# Patient Record
Sex: Female | Born: 1940 | Hispanic: Yes | Marital: Married | Smoking: Never smoker
Health system: Southern US, Community
[De-identification: ages and names within clinical notes are randomized; demographics above are authoritative.]

## PROBLEM LIST (undated history)

## (undated) DIAGNOSIS — Z9989 Dependence on other enabling machines and devices: Secondary | ICD-10-CM

## (undated) DIAGNOSIS — Z7901 Long term (current) use of anticoagulants: Secondary | ICD-10-CM

## (undated) DIAGNOSIS — E785 Hyperlipidemia, unspecified: Secondary | ICD-10-CM

## (undated) DIAGNOSIS — M549 Dorsalgia, unspecified: Secondary | ICD-10-CM

## (undated) DIAGNOSIS — I5032 Chronic diastolic (congestive) heart failure: Secondary | ICD-10-CM

## (undated) DIAGNOSIS — G4733 Obstructive sleep apnea (adult) (pediatric): Secondary | ICD-10-CM

## (undated) DIAGNOSIS — I4892 Unspecified atrial flutter: Secondary | ICD-10-CM

## (undated) DIAGNOSIS — N189 Chronic kidney disease, unspecified: Secondary | ICD-10-CM

## (undated) DIAGNOSIS — M1990 Unspecified osteoarthritis, unspecified site: Secondary | ICD-10-CM

## (undated) DIAGNOSIS — I10 Essential (primary) hypertension: Secondary | ICD-10-CM

## (undated) HISTORY — DX: Unspecified osteoarthritis, unspecified site: M19.90

## (undated) HISTORY — PX: TOTAL ABDOMINAL HYSTERECTOMY: SHX209

## (undated) HISTORY — DX: Hyperlipidemia, unspecified: E78.5

## (undated) HISTORY — DX: Essential (primary) hypertension: I10

## (undated) HISTORY — PX: CHOLECYSTECTOMY: SHX55

## (undated) HISTORY — DX: Unspecified atrial flutter: I48.92

## (undated) HISTORY — DX: Chronic diastolic (congestive) heart failure: I50.32

## (undated) HISTORY — PX: APPENDECTOMY: SHX54

---

## 1980-10-20 HISTORY — PX: TONSILLECTOMY: SUR1361

## 1998-05-14 ENCOUNTER — Other Ambulatory Visit: Admission: RE | Admit: 1998-05-14 | Discharge: 1998-05-14 | Payer: Self-pay | Admitting: Dermatology

## 1998-05-30 ENCOUNTER — Ambulatory Visit (HOSPITAL_COMMUNITY): Admission: RE | Admit: 1998-05-30 | Discharge: 1998-05-30 | Payer: Self-pay | Admitting: Family Medicine

## 1998-10-11 ENCOUNTER — Encounter: Payer: Self-pay | Admitting: Family Medicine

## 1998-10-11 ENCOUNTER — Ambulatory Visit (HOSPITAL_COMMUNITY): Admission: RE | Admit: 1998-10-11 | Discharge: 1998-10-11 | Payer: Self-pay | Admitting: Family Medicine

## 1999-07-04 ENCOUNTER — Ambulatory Visit (HOSPITAL_COMMUNITY): Admission: RE | Admit: 1999-07-04 | Discharge: 1999-07-04 | Payer: Self-pay | Admitting: Family Medicine

## 2000-01-13 ENCOUNTER — Emergency Department (HOSPITAL_COMMUNITY): Admission: EM | Admit: 2000-01-13 | Discharge: 2000-01-13 | Payer: Self-pay | Admitting: Emergency Medicine

## 2000-04-29 ENCOUNTER — Ambulatory Visit (HOSPITAL_COMMUNITY): Admission: RE | Admit: 2000-04-29 | Discharge: 2000-04-29 | Payer: Self-pay | Admitting: Gastroenterology

## 2000-10-09 ENCOUNTER — Ambulatory Visit (HOSPITAL_COMMUNITY): Admission: RE | Admit: 2000-10-09 | Discharge: 2000-10-09 | Payer: Self-pay | Admitting: Unknown Physician Specialty

## 2000-10-09 ENCOUNTER — Encounter: Payer: Self-pay | Admitting: Family Medicine

## 2000-11-01 ENCOUNTER — Encounter: Payer: Self-pay | Admitting: Emergency Medicine

## 2000-11-01 ENCOUNTER — Emergency Department (HOSPITAL_COMMUNITY): Admission: EM | Admit: 2000-11-01 | Discharge: 2000-11-01 | Payer: Self-pay | Admitting: Emergency Medicine

## 2001-03-25 ENCOUNTER — Ambulatory Visit (HOSPITAL_BASED_OUTPATIENT_CLINIC_OR_DEPARTMENT_OTHER): Admission: RE | Admit: 2001-03-25 | Discharge: 2001-03-25 | Payer: Self-pay | Admitting: Family Medicine

## 2001-06-25 ENCOUNTER — Encounter: Payer: Self-pay | Admitting: Family Medicine

## 2001-06-25 ENCOUNTER — Encounter: Admission: RE | Admit: 2001-06-25 | Discharge: 2001-06-25 | Payer: Self-pay | Admitting: Family Medicine

## 2001-07-20 ENCOUNTER — Ambulatory Visit (HOSPITAL_COMMUNITY): Admission: RE | Admit: 2001-07-20 | Discharge: 2001-07-20 | Payer: Self-pay | Admitting: Family Medicine

## 2001-07-20 ENCOUNTER — Encounter: Payer: Self-pay | Admitting: Family Medicine

## 2001-09-04 ENCOUNTER — Encounter: Payer: Self-pay | Admitting: Emergency Medicine

## 2001-09-04 ENCOUNTER — Emergency Department (HOSPITAL_COMMUNITY): Admission: EM | Admit: 2001-09-04 | Discharge: 2001-09-04 | Payer: Self-pay | Admitting: Emergency Medicine

## 2002-02-22 ENCOUNTER — Ambulatory Visit (HOSPITAL_COMMUNITY): Admission: RE | Admit: 2002-02-22 | Discharge: 2002-02-22 | Payer: Self-pay | Admitting: Family Medicine

## 2002-06-11 ENCOUNTER — Emergency Department (HOSPITAL_COMMUNITY): Admission: EM | Admit: 2002-06-11 | Discharge: 2002-06-11 | Payer: Self-pay | Admitting: Emergency Medicine

## 2002-06-11 ENCOUNTER — Encounter: Payer: Self-pay | Admitting: Emergency Medicine

## 2002-09-01 ENCOUNTER — Encounter: Payer: Self-pay | Admitting: Family Medicine

## 2002-09-01 ENCOUNTER — Encounter: Admission: RE | Admit: 2002-09-01 | Discharge: 2002-09-01 | Payer: Self-pay | Admitting: Family Medicine

## 2002-10-18 ENCOUNTER — Encounter: Payer: Self-pay | Admitting: Orthopedic Surgery

## 2002-10-18 ENCOUNTER — Encounter: Admission: RE | Admit: 2002-10-18 | Discharge: 2002-10-18 | Payer: Self-pay | Admitting: Orthopedic Surgery

## 2003-01-19 ENCOUNTER — Emergency Department (HOSPITAL_COMMUNITY): Admission: EM | Admit: 2003-01-19 | Discharge: 2003-01-19 | Payer: Self-pay | Admitting: Emergency Medicine

## 2003-01-19 ENCOUNTER — Encounter: Payer: Self-pay | Admitting: Emergency Medicine

## 2004-03-26 ENCOUNTER — Ambulatory Visit (HOSPITAL_COMMUNITY): Admission: RE | Admit: 2004-03-26 | Discharge: 2004-03-26 | Payer: Self-pay | Admitting: Family Medicine

## 2004-04-09 ENCOUNTER — Emergency Department (HOSPITAL_COMMUNITY): Admission: EM | Admit: 2004-04-09 | Discharge: 2004-04-09 | Payer: Self-pay | Admitting: Emergency Medicine

## 2005-01-04 ENCOUNTER — Inpatient Hospital Stay (HOSPITAL_COMMUNITY): Admission: EM | Admit: 2005-01-04 | Discharge: 2005-01-07 | Payer: Self-pay | Admitting: Emergency Medicine

## 2005-01-06 ENCOUNTER — Encounter (INDEPENDENT_AMBULATORY_CARE_PROVIDER_SITE_OTHER): Payer: Self-pay | Admitting: Cardiovascular Disease

## 2005-02-06 ENCOUNTER — Encounter: Admission: RE | Admit: 2005-02-06 | Discharge: 2005-02-06 | Payer: Self-pay | Admitting: Internal Medicine

## 2005-04-20 ENCOUNTER — Emergency Department (HOSPITAL_COMMUNITY): Admission: EM | Admit: 2005-04-20 | Discharge: 2005-04-20 | Payer: Self-pay | Admitting: Emergency Medicine

## 2005-05-22 ENCOUNTER — Ambulatory Visit (HOSPITAL_COMMUNITY): Admission: RE | Admit: 2005-05-22 | Discharge: 2005-05-22 | Payer: Self-pay | Admitting: Internal Medicine

## 2005-07-13 ENCOUNTER — Emergency Department (HOSPITAL_COMMUNITY): Admission: EM | Admit: 2005-07-13 | Discharge: 2005-07-14 | Payer: Self-pay

## 2006-08-13 ENCOUNTER — Inpatient Hospital Stay (HOSPITAL_COMMUNITY): Admission: EM | Admit: 2006-08-13 | Discharge: 2006-08-16 | Payer: Self-pay | Admitting: Emergency Medicine

## 2006-09-28 ENCOUNTER — Ambulatory Visit (HOSPITAL_COMMUNITY): Admission: RE | Admit: 2006-09-28 | Discharge: 2006-09-28 | Payer: Self-pay | Admitting: Internal Medicine

## 2006-12-11 ENCOUNTER — Ambulatory Visit: Payer: Self-pay | Admitting: Internal Medicine

## 2006-12-15 ENCOUNTER — Ambulatory Visit: Payer: Self-pay | Admitting: *Deleted

## 2007-01-03 ENCOUNTER — Emergency Department (HOSPITAL_COMMUNITY): Admission: EM | Admit: 2007-01-03 | Discharge: 2007-01-03 | Payer: Self-pay | Admitting: Emergency Medicine

## 2007-01-07 ENCOUNTER — Ambulatory Visit: Payer: Self-pay | Admitting: Internal Medicine

## 2007-01-21 ENCOUNTER — Ambulatory Visit: Payer: Self-pay | Admitting: Internal Medicine

## 2007-02-18 ENCOUNTER — Ambulatory Visit: Payer: Self-pay | Admitting: Internal Medicine

## 2007-03-22 ENCOUNTER — Ambulatory Visit: Payer: Self-pay | Admitting: Internal Medicine

## 2007-04-12 ENCOUNTER — Ambulatory Visit: Payer: Self-pay

## 2007-05-04 ENCOUNTER — Ambulatory Visit: Payer: Self-pay | Admitting: Internal Medicine

## 2007-05-26 ENCOUNTER — Encounter (INDEPENDENT_AMBULATORY_CARE_PROVIDER_SITE_OTHER): Payer: Self-pay | Admitting: Nurse Practitioner

## 2007-05-26 ENCOUNTER — Ambulatory Visit: Payer: Self-pay | Admitting: Internal Medicine

## 2007-05-26 LAB — CONVERTED CEMR LAB
BUN: 18 mg/dL (ref 6–23)
Basophils Absolute: 0 10*3/uL (ref 0.0–0.1)
Basophils Relative: 0 % (ref 0–1)
CO2: 24 meq/L (ref 19–32)
Chloride: 105 meq/L (ref 96–112)
Lymphs Abs: 4.3 10*3/uL — ABNORMAL HIGH (ref 0.7–3.3)
MCHC: 32 g/dL (ref 30.0–36.0)
MCV: 83.1 fL (ref 78.0–100.0)
Monocytes Relative: 11 % (ref 3–11)
Neutrophils Relative %: 53 % (ref 43–77)
Platelets: 350 10*3/uL (ref 150–400)
Potassium: 3.8 meq/L (ref 3.5–5.3)
RDW: 15.4 % — ABNORMAL HIGH (ref 11.5–14.0)
Sodium: 143 meq/L (ref 135–145)

## 2007-05-27 ENCOUNTER — Ambulatory Visit: Payer: Self-pay | Admitting: Internal Medicine

## 2007-05-27 ENCOUNTER — Encounter (INDEPENDENT_AMBULATORY_CARE_PROVIDER_SITE_OTHER): Payer: Self-pay | Admitting: Nurse Practitioner

## 2007-05-27 LAB — CONVERTED CEMR LAB
AST: 13 units/L (ref 0–37)
Alkaline Phosphatase: 90 units/L (ref 39–117)
Bilirubin, Direct: 0.1 mg/dL (ref 0.0–0.3)
Indirect Bilirubin: 0.3 mg/dL (ref 0.0–0.9)
LDL Cholesterol: 60 mg/dL (ref 0–99)

## 2007-06-07 ENCOUNTER — Ambulatory Visit: Payer: Self-pay | Admitting: Internal Medicine

## 2007-06-23 ENCOUNTER — Ambulatory Visit: Payer: Self-pay | Admitting: Internal Medicine

## 2007-07-07 ENCOUNTER — Encounter (INDEPENDENT_AMBULATORY_CARE_PROVIDER_SITE_OTHER): Payer: Self-pay | Admitting: *Deleted

## 2007-07-16 ENCOUNTER — Ambulatory Visit: Payer: Self-pay | Admitting: Internal Medicine

## 2007-07-21 ENCOUNTER — Ambulatory Visit: Payer: Self-pay | Admitting: Internal Medicine

## 2007-07-27 ENCOUNTER — Ambulatory Visit: Payer: Self-pay | Admitting: Family Medicine

## 2007-07-30 ENCOUNTER — Ambulatory Visit: Payer: Self-pay | Admitting: Family Medicine

## 2007-08-17 ENCOUNTER — Ambulatory Visit: Payer: Self-pay | Admitting: Internal Medicine

## 2007-09-14 ENCOUNTER — Ambulatory Visit: Payer: Self-pay | Admitting: Internal Medicine

## 2007-09-28 ENCOUNTER — Ambulatory Visit: Payer: Self-pay | Admitting: Internal Medicine

## 2007-10-06 ENCOUNTER — Ambulatory Visit: Payer: Self-pay | Admitting: Internal Medicine

## 2007-10-07 ENCOUNTER — Ambulatory Visit: Payer: Self-pay | Admitting: Internal Medicine

## 2007-10-07 LAB — CONVERTED CEMR LAB
ALT: 17 units/L (ref 0–35)
BUN: 19 mg/dL (ref 6–23)
Chloride: 101 meq/L (ref 96–112)
Creatinine, Ser: 0.59 mg/dL (ref 0.40–1.20)
Eosinophils Relative: 3 % (ref 0–5)
Glucose, Bld: 137 mg/dL — ABNORMAL HIGH (ref 70–99)
LDL Cholesterol: 69 mg/dL (ref 0–99)
Lymphocytes Relative: 31 % (ref 12–46)
Monocytes Absolute: 1.3 10*3/uL — ABNORMAL HIGH (ref 0.1–1.0)
Monocytes Relative: 12 % (ref 3–12)
Neutrophils Relative %: 54 % (ref 43–77)
Platelets: 352 10*3/uL (ref 150–400)
RBC: 5.46 M/uL — ABNORMAL HIGH (ref 3.87–5.11)
RDW: 15.7 % — ABNORMAL HIGH (ref 11.5–15.5)
Sodium: 142 meq/L (ref 135–145)
Total CHOL/HDL Ratio: 2.7
Triglycerides: 189 mg/dL — ABNORMAL HIGH (ref <150)
VLDL: 38 mg/dL (ref 0–40)
WBC: 11.1 10*3/uL — ABNORMAL HIGH (ref 4.0–10.5)

## 2007-10-28 ENCOUNTER — Ambulatory Visit: Payer: Self-pay | Admitting: Internal Medicine

## 2007-11-22 ENCOUNTER — Emergency Department (HOSPITAL_COMMUNITY): Admission: EM | Admit: 2007-11-22 | Discharge: 2007-11-22 | Payer: Self-pay | Admitting: Emergency Medicine

## 2007-11-25 ENCOUNTER — Ambulatory Visit: Payer: Self-pay | Admitting: Nurse Practitioner

## 2007-11-25 ENCOUNTER — Ambulatory Visit: Payer: Self-pay | Admitting: Internal Medicine

## 2007-12-02 ENCOUNTER — Ambulatory Visit (HOSPITAL_COMMUNITY): Admission: RE | Admit: 2007-12-02 | Discharge: 2007-12-02 | Payer: Self-pay | Admitting: Family Medicine

## 2007-12-23 ENCOUNTER — Ambulatory Visit: Payer: Self-pay | Admitting: Internal Medicine

## 2008-01-13 ENCOUNTER — Ambulatory Visit: Payer: Self-pay | Admitting: Nurse Practitioner

## 2008-02-01 ENCOUNTER — Ambulatory Visit: Payer: Self-pay | Admitting: Nurse Practitioner

## 2008-02-22 ENCOUNTER — Ambulatory Visit: Payer: Self-pay | Admitting: Internal Medicine

## 2008-03-10 ENCOUNTER — Ambulatory Visit: Payer: Self-pay | Admitting: Internal Medicine

## 2008-03-14 ENCOUNTER — Emergency Department (HOSPITAL_COMMUNITY): Admission: EM | Admit: 2008-03-14 | Discharge: 2008-03-14 | Payer: Self-pay | Admitting: Emergency Medicine

## 2008-03-21 ENCOUNTER — Ambulatory Visit: Payer: Self-pay | Admitting: Internal Medicine

## 2008-03-21 LAB — CONVERTED CEMR LAB
Albumin: 4.1 g/dL (ref 3.5–5.2)
Basophils Absolute: 0 10*3/uL (ref 0.0–0.1)
Basophils Relative: 0 % (ref 0–1)
Bilirubin, Direct: 0.1 mg/dL (ref 0.0–0.3)
CO2: 26 meq/L (ref 19–32)
Calcium: 8.8 mg/dL (ref 8.4–10.5)
Cholesterol: 144 mg/dL (ref 0–200)
Creatinine, Ser: 0.64 mg/dL (ref 0.40–1.20)
Eosinophils Relative: 2 % (ref 0–5)
Glucose, Bld: 150 mg/dL — ABNORMAL HIGH (ref 70–99)
HCT: 44.9 % (ref 36.0–46.0)
Hemoglobin: 13.9 g/dL (ref 12.0–15.0)
LDL Cholesterol: 61 mg/dL (ref 0–99)
Lymphs Abs: 3.4 10*3/uL (ref 0.7–4.0)
Monocytes Absolute: 1.4 10*3/uL — ABNORMAL HIGH (ref 0.1–1.0)
Monocytes Relative: 11 % (ref 3–12)
Potassium: 3.9 meq/L (ref 3.5–5.3)
RBC: 5.32 M/uL — ABNORMAL HIGH (ref 3.87–5.11)
Sodium: 142 meq/L (ref 135–145)

## 2008-03-30 ENCOUNTER — Ambulatory Visit: Payer: Self-pay | Admitting: Internal Medicine

## 2008-04-16 ENCOUNTER — Emergency Department (HOSPITAL_COMMUNITY): Admission: EM | Admit: 2008-04-16 | Discharge: 2008-04-17 | Payer: Self-pay | Admitting: Emergency Medicine

## 2008-05-01 ENCOUNTER — Ambulatory Visit: Payer: Self-pay | Admitting: Internal Medicine

## 2008-05-01 LAB — CONVERTED CEMR LAB
CO2: 25 meq/L (ref 19–32)
Chloride: 102 meq/L (ref 96–112)
Creatinine, Ser: 0.64 mg/dL (ref 0.40–1.20)
INR: 1.6 — ABNORMAL HIGH (ref 0.0–1.5)
Sodium: 140 meq/L (ref 135–145)

## 2008-05-10 ENCOUNTER — Ambulatory Visit: Payer: Self-pay | Admitting: Internal Medicine

## 2008-05-11 ENCOUNTER — Encounter (INDEPENDENT_AMBULATORY_CARE_PROVIDER_SITE_OTHER): Payer: Self-pay | Admitting: Internal Medicine

## 2008-05-15 ENCOUNTER — Ambulatory Visit: Payer: Self-pay | Admitting: Internal Medicine

## 2008-05-15 LAB — CONVERTED CEMR LAB: INR: 1.8 — ABNORMAL HIGH (ref 0.0–1.5)

## 2008-09-25 ENCOUNTER — Emergency Department (HOSPITAL_COMMUNITY): Admission: EM | Admit: 2008-09-25 | Discharge: 2008-09-26 | Payer: Self-pay | Admitting: Emergency Medicine

## 2008-09-27 ENCOUNTER — Ambulatory Visit: Payer: Self-pay | Admitting: Internal Medicine

## 2008-09-27 LAB — CONVERTED CEMR LAB
INR: 1.7 — ABNORMAL HIGH (ref 0.0–1.5)
Prothrombin Time: 21.2 s — ABNORMAL HIGH (ref 11.6–15.2)

## 2008-09-28 ENCOUNTER — Ambulatory Visit: Payer: Self-pay | Admitting: Internal Medicine

## 2008-10-05 ENCOUNTER — Ambulatory Visit: Payer: Self-pay | Admitting: Internal Medicine

## 2008-10-19 ENCOUNTER — Ambulatory Visit: Payer: Self-pay | Admitting: Internal Medicine

## 2008-10-19 LAB — CONVERTED CEMR LAB
ALT: 12 units/L (ref 0–35)
AST: 13 units/L (ref 0–37)
Albumin: 4.1 g/dL (ref 3.5–5.2)
Alkaline Phosphatase: 91 units/L (ref 39–117)
BUN: 20 mg/dL (ref 6–23)
CO2: 28 meq/L (ref 19–32)
Calcium: 9.8 mg/dL (ref 8.4–10.5)
Creatinine, Ser: 0.61 mg/dL (ref 0.40–1.20)
Potassium: 4 meq/L (ref 3.5–5.3)
Sodium: 144 meq/L (ref 135–145)

## 2008-10-29 ENCOUNTER — Encounter: Payer: Self-pay | Admitting: Internal Medicine

## 2008-10-29 ENCOUNTER — Ambulatory Visit (HOSPITAL_BASED_OUTPATIENT_CLINIC_OR_DEPARTMENT_OTHER): Admission: RE | Admit: 2008-10-29 | Discharge: 2008-10-29 | Payer: Self-pay | Admitting: Internal Medicine

## 2008-11-07 ENCOUNTER — Ambulatory Visit: Payer: Self-pay | Admitting: Pulmonary Disease

## 2008-11-16 ENCOUNTER — Ambulatory Visit: Payer: Self-pay | Admitting: Internal Medicine

## 2008-11-22 ENCOUNTER — Ambulatory Visit (HOSPITAL_COMMUNITY): Admission: RE | Admit: 2008-11-22 | Discharge: 2008-11-22 | Payer: Self-pay | Admitting: Gastroenterology

## 2008-11-30 ENCOUNTER — Ambulatory Visit: Payer: Self-pay | Admitting: Internal Medicine

## 2008-11-30 ENCOUNTER — Ambulatory Visit: Payer: Self-pay | Admitting: Pulmonary Disease

## 2008-11-30 DIAGNOSIS — E785 Hyperlipidemia, unspecified: Secondary | ICD-10-CM | POA: Insufficient documentation

## 2008-11-30 DIAGNOSIS — G4733 Obstructive sleep apnea (adult) (pediatric): Secondary | ICD-10-CM | POA: Insufficient documentation

## 2008-12-04 ENCOUNTER — Ambulatory Visit (HOSPITAL_COMMUNITY): Admission: RE | Admit: 2008-12-04 | Discharge: 2008-12-04 | Payer: Self-pay | Admitting: Internal Medicine

## 2008-12-21 ENCOUNTER — Ambulatory Visit: Payer: Self-pay | Admitting: Internal Medicine

## 2008-12-28 ENCOUNTER — Ambulatory Visit: Payer: Self-pay | Admitting: Internal Medicine

## 2009-01-10 ENCOUNTER — Encounter: Admission: RE | Admit: 2009-01-10 | Discharge: 2009-04-10 | Payer: Self-pay | Admitting: Internal Medicine

## 2009-01-13 ENCOUNTER — Encounter: Payer: Self-pay | Admitting: Pulmonary Disease

## 2009-01-22 ENCOUNTER — Encounter: Payer: Self-pay | Admitting: Internal Medicine

## 2009-01-22 ENCOUNTER — Ambulatory Visit: Payer: Self-pay | Admitting: Internal Medicine

## 2009-01-22 DIAGNOSIS — M7660 Achilles tendinitis, unspecified leg: Secondary | ICD-10-CM | POA: Insufficient documentation

## 2009-01-23 ENCOUNTER — Ambulatory Visit: Payer: Self-pay | Admitting: Internal Medicine

## 2009-01-24 ENCOUNTER — Ambulatory Visit: Payer: Self-pay | Admitting: Pulmonary Disease

## 2009-01-25 ENCOUNTER — Ambulatory Visit: Payer: Self-pay | Admitting: Internal Medicine

## 2009-02-22 ENCOUNTER — Ambulatory Visit: Payer: Self-pay | Admitting: Internal Medicine

## 2009-03-05 ENCOUNTER — Encounter (INDEPENDENT_AMBULATORY_CARE_PROVIDER_SITE_OTHER): Payer: Self-pay | Admitting: *Deleted

## 2009-03-06 ENCOUNTER — Ambulatory Visit (HOSPITAL_COMMUNITY): Admission: RE | Admit: 2009-03-06 | Discharge: 2009-03-06 | Payer: Self-pay | Admitting: Internal Medicine

## 2009-03-07 ENCOUNTER — Ambulatory Visit: Payer: Self-pay | Admitting: Internal Medicine

## 2009-03-12 ENCOUNTER — Ambulatory Visit: Payer: Self-pay | Admitting: Pulmonary Disease

## 2009-03-28 ENCOUNTER — Ambulatory Visit: Payer: Self-pay | Admitting: Internal Medicine

## 2009-05-09 ENCOUNTER — Ambulatory Visit: Payer: Self-pay | Admitting: Internal Medicine

## 2009-05-09 ENCOUNTER — Ambulatory Visit: Payer: Self-pay | Admitting: Family Medicine

## 2009-05-16 DIAGNOSIS — E119 Type 2 diabetes mellitus without complications: Secondary | ICD-10-CM | POA: Insufficient documentation

## 2009-05-16 DIAGNOSIS — I10 Essential (primary) hypertension: Secondary | ICD-10-CM

## 2009-05-16 DIAGNOSIS — I4891 Unspecified atrial fibrillation: Secondary | ICD-10-CM

## 2009-05-16 DIAGNOSIS — Z8669 Personal history of other diseases of the nervous system and sense organs: Secondary | ICD-10-CM | POA: Insufficient documentation

## 2009-05-16 DIAGNOSIS — I480 Paroxysmal atrial fibrillation: Secondary | ICD-10-CM | POA: Insufficient documentation

## 2009-05-16 DIAGNOSIS — M109 Gout, unspecified: Secondary | ICD-10-CM | POA: Insufficient documentation

## 2009-05-16 DIAGNOSIS — E1159 Type 2 diabetes mellitus with other circulatory complications: Secondary | ICD-10-CM | POA: Insufficient documentation

## 2009-05-16 DIAGNOSIS — I152 Hypertension secondary to endocrine disorders: Secondary | ICD-10-CM | POA: Insufficient documentation

## 2009-05-24 ENCOUNTER — Emergency Department (HOSPITAL_COMMUNITY): Admission: EM | Admit: 2009-05-24 | Discharge: 2009-05-25 | Payer: Self-pay | Admitting: Emergency Medicine

## 2009-06-04 ENCOUNTER — Ambulatory Visit: Payer: Self-pay | Admitting: Internal Medicine

## 2009-06-04 LAB — CONVERTED CEMR LAB
ALT: 12 units/L (ref 0–35)
AST: 14 units/L (ref 0–37)
Alkaline Phosphatase: 84 units/L (ref 39–117)
BUN: 15 mg/dL (ref 6–23)
CO2: 24 meq/L (ref 19–32)
Calcium: 8.9 mg/dL (ref 8.4–10.5)
Cholesterol: 154 mg/dL (ref 0–200)
Creatinine, Ser: 0.56 mg/dL (ref 0.40–1.20)
Glucose, Bld: 128 mg/dL — ABNORMAL HIGH (ref 70–99)
HDL: 58 mg/dL (ref >39)
Sodium: 144 meq/L (ref 135–145)
Total Protein: 7 g/dL (ref 6.0–8.3)
Triglycerides: 176 mg/dL — ABNORMAL HIGH (ref <150)

## 2009-06-13 ENCOUNTER — Encounter: Payer: Self-pay | Admitting: Physician Assistant

## 2009-06-13 ENCOUNTER — Ambulatory Visit: Payer: Self-pay | Admitting: Internal Medicine

## 2009-06-21 ENCOUNTER — Ambulatory Visit: Payer: Self-pay | Admitting: Internal Medicine

## 2009-06-22 ENCOUNTER — Encounter (INDEPENDENT_AMBULATORY_CARE_PROVIDER_SITE_OTHER): Payer: Self-pay | Admitting: Internal Medicine

## 2009-07-05 ENCOUNTER — Ambulatory Visit: Payer: Self-pay | Admitting: Internal Medicine

## 2009-08-03 ENCOUNTER — Emergency Department (HOSPITAL_COMMUNITY): Admission: EM | Admit: 2009-08-03 | Discharge: 2009-08-03 | Payer: Self-pay | Admitting: Emergency Medicine

## 2009-08-21 ENCOUNTER — Ambulatory Visit: Payer: Self-pay | Admitting: Internal Medicine

## 2009-08-21 LAB — CONVERTED CEMR LAB
Calcium: 8.9 mg/dL (ref 8.4–10.5)
Chloride: 101 meq/L (ref 96–112)
Glucose, Bld: 155 mg/dL — ABNORMAL HIGH (ref 70–99)

## 2009-08-22 ENCOUNTER — Encounter (INDEPENDENT_AMBULATORY_CARE_PROVIDER_SITE_OTHER): Payer: Self-pay | Admitting: Internal Medicine

## 2009-09-03 ENCOUNTER — Ambulatory Visit: Payer: Self-pay | Admitting: Internal Medicine

## 2009-09-12 ENCOUNTER — Ambulatory Visit: Payer: Self-pay | Admitting: Internal Medicine

## 2009-09-18 ENCOUNTER — Ambulatory Visit: Payer: Self-pay | Admitting: Internal Medicine

## 2009-09-18 LAB — CONVERTED CEMR LAB
BUN: 16 mg/dL (ref 6–23)
Calcium: 9.1 mg/dL (ref 8.4–10.5)
Chloride: 101 meq/L (ref 96–112)
Creatinine, Ser: 0.61 mg/dL (ref 0.40–1.20)
Potassium: 3.7 meq/L (ref 3.5–5.3)

## 2009-09-27 ENCOUNTER — Ambulatory Visit: Payer: Self-pay | Admitting: Internal Medicine

## 2009-10-01 ENCOUNTER — Ambulatory Visit: Payer: Self-pay | Admitting: Internal Medicine

## 2009-10-11 ENCOUNTER — Ambulatory Visit: Payer: Self-pay | Admitting: Internal Medicine

## 2009-10-11 ENCOUNTER — Inpatient Hospital Stay (HOSPITAL_COMMUNITY): Admission: EM | Admit: 2009-10-11 | Discharge: 2009-10-12 | Payer: Self-pay | Admitting: Emergency Medicine

## 2009-10-22 ENCOUNTER — Ambulatory Visit: Payer: Self-pay | Admitting: Internal Medicine

## 2009-10-29 ENCOUNTER — Ambulatory Visit: Payer: Self-pay | Admitting: Internal Medicine

## 2009-11-15 ENCOUNTER — Ambulatory Visit: Payer: Self-pay | Admitting: Internal Medicine

## 2009-11-15 LAB — CONVERTED CEMR LAB: Hgb A1c MFr Bld: 7.3 % — ABNORMAL HIGH (ref 4.6–6.1)

## 2009-11-20 ENCOUNTER — Ambulatory Visit: Payer: Self-pay | Admitting: Internal Medicine

## 2009-11-20 LAB — CONVERTED CEMR LAB
CO2: 30 meq/L (ref 19–32)
Calcium: 8.9 mg/dL (ref 8.4–10.5)
Chloride: 98 meq/L (ref 96–112)
Glucose, Bld: 96 mg/dL (ref 70–99)
Potassium: 3.5 meq/L (ref 3.5–5.3)
Sodium: 142 meq/L (ref 135–145)

## 2009-11-30 ENCOUNTER — Ambulatory Visit: Payer: Self-pay | Admitting: Adult Health

## 2009-11-30 ENCOUNTER — Telehealth (INDEPENDENT_AMBULATORY_CARE_PROVIDER_SITE_OTHER): Payer: Self-pay | Admitting: *Deleted

## 2009-12-13 ENCOUNTER — Ambulatory Visit: Payer: Self-pay | Admitting: Internal Medicine

## 2010-01-02 ENCOUNTER — Ambulatory Visit: Payer: Self-pay | Admitting: Internal Medicine

## 2010-01-02 ENCOUNTER — Ambulatory Visit (HOSPITAL_COMMUNITY): Admission: RE | Admit: 2010-01-02 | Discharge: 2010-01-02 | Payer: Self-pay | Admitting: Internal Medicine

## 2010-01-04 ENCOUNTER — Ambulatory Visit: Payer: Self-pay | Admitting: Internal Medicine

## 2010-01-04 ENCOUNTER — Ambulatory Visit: Payer: Self-pay | Admitting: Family Medicine

## 2010-01-08 ENCOUNTER — Ambulatory Visit (HOSPITAL_COMMUNITY): Admission: RE | Admit: 2010-01-08 | Discharge: 2010-01-08 | Payer: Self-pay | Admitting: Family Medicine

## 2010-01-14 ENCOUNTER — Ambulatory Visit: Payer: Self-pay | Admitting: Internal Medicine

## 2010-01-31 ENCOUNTER — Ambulatory Visit: Payer: Self-pay | Admitting: Internal Medicine

## 2010-02-18 ENCOUNTER — Telehealth (INDEPENDENT_AMBULATORY_CARE_PROVIDER_SITE_OTHER): Payer: Self-pay | Admitting: *Deleted

## 2010-02-18 ENCOUNTER — Ambulatory Visit: Payer: Self-pay | Admitting: Family Medicine

## 2010-02-20 ENCOUNTER — Ambulatory Visit: Payer: Self-pay | Admitting: Internal Medicine

## 2010-02-20 DIAGNOSIS — I5032 Chronic diastolic (congestive) heart failure: Secondary | ICD-10-CM | POA: Insufficient documentation

## 2010-02-25 ENCOUNTER — Ambulatory Visit: Payer: Self-pay | Admitting: Pulmonary Disease

## 2010-02-25 ENCOUNTER — Ambulatory Visit: Payer: Self-pay | Admitting: Internal Medicine

## 2010-02-25 LAB — CONVERTED CEMR LAB
Anti Nuclear Antibody(ANA): NEGATIVE
BUN: 14 mg/dL (ref 6–23)
CO2: 30 meq/L (ref 19–32)
Calcium: 8.8 mg/dL (ref 8.4–10.5)
Chloride: 102 meq/L (ref 96–112)
Creatinine, Ser: 0.55 mg/dL (ref 0.40–1.20)
Glucose, Bld: 118 mg/dL — ABNORMAL HIGH (ref 70–99)
Hgb A1c MFr Bld: 7.3 % — ABNORMAL HIGH (ref <5.7)
TSH: 2.852 microintl units/mL (ref 0.350–4.500)
Total Bilirubin: 0.4 mg/dL (ref 0.3–1.2)

## 2010-02-28 ENCOUNTER — Ambulatory Visit: Payer: Self-pay | Admitting: Internal Medicine

## 2010-03-05 ENCOUNTER — Telehealth (INDEPENDENT_AMBULATORY_CARE_PROVIDER_SITE_OTHER): Payer: Self-pay | Admitting: *Deleted

## 2010-03-06 ENCOUNTER — Ambulatory Visit: Payer: Self-pay | Admitting: Internal Medicine

## 2010-03-19 ENCOUNTER — Ambulatory Visit: Payer: Self-pay | Admitting: Internal Medicine

## 2010-04-05 ENCOUNTER — Ambulatory Visit: Payer: Self-pay | Admitting: Internal Medicine

## 2010-04-08 ENCOUNTER — Ambulatory Visit: Payer: Self-pay | Admitting: Internal Medicine

## 2010-05-08 ENCOUNTER — Ambulatory Visit: Payer: Self-pay | Admitting: Internal Medicine

## 2010-05-09 ENCOUNTER — Ambulatory Visit: Payer: Self-pay | Admitting: Family Medicine

## 2010-05-16 ENCOUNTER — Ambulatory Visit: Payer: Self-pay | Admitting: Family Medicine

## 2010-05-27 ENCOUNTER — Ambulatory Visit: Payer: Self-pay | Admitting: Internal Medicine

## 2010-06-01 ENCOUNTER — Emergency Department (HOSPITAL_COMMUNITY): Admission: EM | Admit: 2010-06-01 | Discharge: 2010-06-01 | Payer: Self-pay | Admitting: Emergency Medicine

## 2010-06-06 ENCOUNTER — Ambulatory Visit: Payer: Self-pay | Admitting: Internal Medicine

## 2010-06-17 ENCOUNTER — Inpatient Hospital Stay (HOSPITAL_COMMUNITY): Admission: EM | Admit: 2010-06-17 | Discharge: 2010-06-18 | Payer: Self-pay | Admitting: Emergency Medicine

## 2010-06-17 ENCOUNTER — Ambulatory Visit: Payer: Self-pay | Admitting: Cardiology

## 2010-06-20 ENCOUNTER — Ambulatory Visit: Payer: Self-pay | Admitting: Internal Medicine

## 2010-07-04 ENCOUNTER — Encounter (INDEPENDENT_AMBULATORY_CARE_PROVIDER_SITE_OTHER): Payer: Self-pay | Admitting: Internal Medicine

## 2010-07-04 LAB — CONVERTED CEMR LAB
ALT: 11 units/L (ref 0–35)
AST: 13 units/L (ref 0–37)
BUN: 18 mg/dL (ref 6–23)
Cholesterol: 157 mg/dL (ref 0–200)
Creatinine, Ser: 0.67 mg/dL (ref 0.40–1.20)
HDL: 61 mg/dL (ref >39)
Hgb A1c MFr Bld: 7.8 % — ABNORMAL HIGH (ref <5.7)
INR: 1.78 — ABNORMAL HIGH (ref <1.50)
Prothrombin Time: 20.9 s — ABNORMAL HIGH (ref 11.6–15.2)
Total Bilirubin: 0.7 mg/dL (ref 0.3–1.2)
Total CHOL/HDL Ratio: 2.6
VLDL: 35 mg/dL (ref 0–40)

## 2010-07-09 ENCOUNTER — Telehealth: Payer: Self-pay | Admitting: Internal Medicine

## 2010-08-01 ENCOUNTER — Encounter (INDEPENDENT_AMBULATORY_CARE_PROVIDER_SITE_OTHER): Payer: Self-pay | Admitting: Internal Medicine

## 2010-08-01 LAB — CONVERTED CEMR LAB: Prothrombin Time: 28.9 s — ABNORMAL HIGH (ref 11.6–15.2)

## 2010-10-02 ENCOUNTER — Emergency Department (HOSPITAL_COMMUNITY)
Admission: EM | Admit: 2010-10-02 | Discharge: 2010-10-02 | Payer: Self-pay | Source: Home / Self Care | Admitting: Family Medicine

## 2010-10-03 ENCOUNTER — Telehealth: Payer: Self-pay | Admitting: Internal Medicine

## 2010-10-11 ENCOUNTER — Ambulatory Visit (HOSPITAL_COMMUNITY)
Admission: RE | Admit: 2010-10-11 | Discharge: 2010-10-11 | Payer: Self-pay | Source: Home / Self Care | Attending: Family Medicine | Admitting: Family Medicine

## 2010-10-14 ENCOUNTER — Emergency Department (HOSPITAL_COMMUNITY)
Admission: EM | Admit: 2010-10-14 | Discharge: 2010-10-14 | Payer: Self-pay | Source: Home / Self Care | Admitting: Emergency Medicine

## 2010-10-14 ENCOUNTER — Emergency Department (HOSPITAL_COMMUNITY)
Admission: EM | Admit: 2010-10-14 | Discharge: 2010-10-14 | Disposition: A | Discharge status: Other Acute Inpatient Hospital | Payer: Self-pay | Source: Home / Self Care | Admitting: Emergency Medicine

## 2010-10-15 ENCOUNTER — Encounter: Payer: Self-pay | Admitting: Internal Medicine

## 2010-10-15 ENCOUNTER — Telehealth: Payer: Self-pay | Admitting: Internal Medicine

## 2010-10-15 ENCOUNTER — Ambulatory Visit
Admission: RE | Admit: 2010-10-15 | Discharge: 2010-10-15 | Payer: Self-pay | Source: Home / Self Care | Attending: Internal Medicine | Admitting: Internal Medicine

## 2010-10-15 ENCOUNTER — Encounter (INDEPENDENT_AMBULATORY_CARE_PROVIDER_SITE_OTHER): Payer: Self-pay | Admitting: *Deleted

## 2010-10-16 ENCOUNTER — Encounter: Payer: Self-pay | Admitting: Internal Medicine

## 2010-10-16 ENCOUNTER — Ambulatory Visit (HOSPITAL_COMMUNITY)
Admission: RE | Admit: 2010-10-16 | Discharge: 2010-10-16 | Payer: Self-pay | Source: Home / Self Care | Attending: Family Medicine | Admitting: Family Medicine

## 2010-10-16 LAB — CONVERTED CEMR LAB
Basophils Absolute: 0.1 10*3/uL (ref 0.0–0.1)
Basophils Relative: 0.9 % (ref 0.0–3.0)
CO2: 30 meq/L (ref 19–32)
Calcium: 9.4 mg/dL (ref 8.4–10.5)
Creatinine, Ser: 0.8 mg/dL (ref 0.4–1.2)
Eosinophils Absolute: 0.2 10*3/uL (ref 0.0–0.7)
GFR calc non Af Amer: 76.04 mL/min (ref >60.00)
Hemoglobin: 14.6 g/dL (ref 12.0–15.0)
INR: 1.9 — ABNORMAL HIGH (ref 0.8–1.0)
Lymphocytes Relative: 24 % (ref 12.0–46.0)
MCHC: 33.1 g/dL (ref 30.0–36.0)
Monocytes Relative: 11 % (ref 3.0–12.0)
Neutro Abs: 10.5 10*3/uL — ABNORMAL HIGH (ref 1.4–7.7)
Neutrophils Relative %: 62.8 % (ref 43.0–77.0)
RBC: 5.42 M/uL — ABNORMAL HIGH (ref 3.87–5.11)
Sodium: 141 meq/L (ref 135–145)
aPTT: 35.6 s — ABNORMAL HIGH (ref 21.7–28.8)

## 2010-10-17 ENCOUNTER — Ambulatory Visit (HOSPITAL_COMMUNITY)
Admission: RE | Admit: 2010-10-17 | Discharge: 2010-10-17 | Payer: Self-pay | Source: Home / Self Care | Attending: Internal Medicine | Admitting: Internal Medicine

## 2010-10-18 ENCOUNTER — Encounter (INDEPENDENT_AMBULATORY_CARE_PROVIDER_SITE_OTHER): Payer: Self-pay | Admitting: Family Medicine

## 2010-10-18 ENCOUNTER — Encounter: Payer: Self-pay | Admitting: Endocrinology

## 2010-10-18 LAB — CONVERTED CEMR LAB: Cortisol - AM: 4.8 ug/dL (ref 4.3–22.4)

## 2010-10-22 ENCOUNTER — Telehealth (INDEPENDENT_AMBULATORY_CARE_PROVIDER_SITE_OTHER): Payer: Self-pay | Admitting: *Deleted

## 2010-10-22 ENCOUNTER — Encounter: Payer: Self-pay | Admitting: Endocrinology

## 2010-10-23 ENCOUNTER — Encounter (INDEPENDENT_AMBULATORY_CARE_PROVIDER_SITE_OTHER): Payer: Self-pay | Admitting: Family Medicine

## 2010-10-25 ENCOUNTER — Ambulatory Visit (HOSPITAL_COMMUNITY)
Admission: RE | Admit: 2010-10-25 | Discharge: 2010-10-25 | Payer: Self-pay | Source: Home / Self Care | Attending: Family Medicine | Admitting: Family Medicine

## 2010-10-25 LAB — BUN: BUN: 13 mg/dL (ref 6–23)

## 2010-10-25 LAB — CREATININE, SERUM
Creatinine, Ser: 0.76 mg/dL (ref 0.4–1.2)
GFR calc Af Amer: >60 mL/min (ref >60)
GFR calc non Af Amer: >60 mL/min (ref >60)

## 2010-10-29 ENCOUNTER — Encounter: Payer: Self-pay | Admitting: Endocrinology

## 2010-10-29 ENCOUNTER — Encounter (INDEPENDENT_AMBULATORY_CARE_PROVIDER_SITE_OTHER): Payer: Self-pay | Admitting: *Deleted

## 2010-10-29 LAB — CONVERTED CEMR LAB
ALT: 16 units/L (ref 0–35)
AST: <8 units/L (ref 0–37)
Alkaline Phosphatase: 92 units/L (ref 39–117)
BUN: 20 mg/dL (ref 6–23)
Creatinine, Ser: 0.58 mg/dL (ref 0.40–1.20)
Potassium: 3.4 meq/L — ABNORMAL LOW (ref 3.5–5.3)

## 2010-11-04 ENCOUNTER — Telehealth: Payer: Self-pay | Admitting: Internal Medicine

## 2010-11-14 ENCOUNTER — Encounter (INDEPENDENT_AMBULATORY_CARE_PROVIDER_SITE_OTHER): Payer: Self-pay | Admitting: Family Medicine

## 2010-11-14 LAB — CONVERTED CEMR LAB
BUN: 18 mg/dL (ref 6–23)
CO2: 27 meq/L (ref 19–32)
Glucose, Bld: 134 mg/dL — ABNORMAL HIGH (ref 70–99)
Potassium: 4 meq/L (ref 3.5–5.3)
Sodium: 140 meq/L (ref 135–145)

## 2010-11-21 NOTE — Progress Notes (Signed)
   Phone Note Outgoing Call   Call placed by: Sheila Ocasio Call placed to: Patient Details for Reason: schedule flutter ablation Summary of Call: lmfcb  Initial call taken by: Claris Gladden RN,  October 22, 2010 9:31 AM  Follow-up for Phone Call        spoke w/pt and she is not familiar with the procedure. will send her info and we will discuss additional questions next week. pt is agreeable.  Follow-up by: Claris Gladden RN,  October 25, 2010 5:28 PM  Additional Follow-up for Phone Call Additional follow up Details #1::        11/07/10 1717-ring no answer at pt house. will try again later. Claris Gladden, RN, BSN 11/11/10 1811 have tried pt home number and get fast busy. will try her son in the am. Claris Gladden, RN, BSN 11/13/10 1435 spoke w/pt son and he gave  me pt cell phone number [phone]. Claris Gladden, RN, BSN pt has appt on 2/7 to discuss procedure.  Additional Follow-up by: Claris Gladden RN,  November 14, 2010 5:51 PM

## 2010-11-21 NOTE — Assessment & Plan Note (Signed)
Summary: palpitatons/tachy  Medications Added FUROSEMIDE 20 MG TABS (FUROSEMIDE) as needed POTASSIUM CHLORIDE CRYS CR 20 MEQ CR-TABS (POTASSIUM CHLORIDE CRYS CR) Take one tablet by mouth daily      Allergies Added: NKDA  Visit Type:  EPH Referring Early Steel:  Graciela Husbands Primary Mahmoud Blazejewski:  Dala Dock   History of Present Illness: Mrs. [name] is seen following a hospitalization in December 2010  for episode of atrial fibrillation that was recurrent. She was treated with flecainide and converted to sinus rhythm. She has been maintaining sinus rhythm since. She was last seen in MAY in the office.  However, she was seen in hospital in August for atrial fibrillation. She was treated with flecainide-300 and converted to sinus rhythm.  That time she was given a prescription for flecainide as an outpatient. Yesterday she went out of rhythm again. She couldn't find the medicines initially but then did after she was already decided to go to hospital. She arrived at hospital. She was apparently told to wait for the physician before she took her medication. That happened 5 hours after showing up. She then tells me she was discharged 6 hours later wall or heart was still out of rhythm.  Review of this ECG demonstrates atrial flutter. INR was 1.64  Review of her hospitalization records from December 2010 demonstrate no evaluation of LV function at that time. In 2007 she undergone echo and Myoview scanning both of which demonstrated normal left ventricular function and the latter no evidence of ischemia.  She denies chest pain or palpitations.  She has had an intercurrent episode of bronchitis.  Problems Prior to Update: 1)  Diastolic Heart Failure, Acute On Chronic  (ICD-428.33) 2)  Atrial Fibrillation, Paroxysmal  (ICD-427.31) 3)  Hypertension  (ICD-401.9) 4)  Syncope, Hx of  (ICD-V12.49) 5)  Hyperlipidemia  (ICD-272.4) 6)  Dm  (ICD-250.00) 7)  Obesity, Morbid  (ICD-278.01) 8)  Gout   (ICD-274.9) 9)  Achilles Tendinitis  (ICD-726.71) 10)  Obstructive Sleep Apnea  (ICD-327.23)  Current Medications (verified): 1)  Coumadin 5 Mg Tabs (Warfarin Sodium) .... Once Daily 2)  Tiazac 240 Mg Xr24h-Cap (Diltiazem Hcl Er Beads) .Marland Kitchen.. 1 Capsule Daily 3)  Maxzide-25 37.5-25 Mg Tabs (Triamterene-Hctz) .Marland Kitchen.. 1 Tab in The Am 4)  Crestor 20 Mg Tabs (Rosuvastatin Calcium) .... Once A Day 5)  Lisinopril 40 Mg Tabs (Lisinopril) .... Once A Day 6)  Metoprolol Tartrate 100 Mg Tabs (Metoprolol Tartrate) .... Take One Tablet Two Times A Day 7)  Furosemide 20 Mg Tabs (Furosemide) .... As Needed 8)  Potassium Chloride Crys Cr 20 Meq Cr-Tabs (Potassium Chloride Crys Cr) .... Take One Tablet By Mouth Daily  Allergies (verified): No Known Drug Allergies  Past History:  Past Medical History: Last updated: 05/16/2009 Current Problems:  ATRIAL FIBRILLATION, PAROXYSMAL (ICD-427.31) HYPERTENSION (ICD-401.9) SYNCOPE, HX OF (ICD-V12.49) HYPERLIPIDEMIA (ICD-272.4) DM (ICD-250.00) OBESITY, MORBID (ICD-278.01) GOUT (ICD-274.9) ACHILLES TENDINITIS (ICD-726.71) OBSTRUCTIVE SLEEP APNEA (ICD-327.23)  Past Surgical History: Last updated: 05/16/2009 hysterectomy-30 yrs ago tonsilectomy- Appendectomy-30 yrs ago Cholecystectomy-8 yrs ago  Family History: Last updated: 05/16/2009  Positive for diabetes, both sides of her family.  Mother  died of a head injury after a fall in the shower.  Father has coronary  artery disease and is status post coronary artery bypass grafting x3.  Social History: Last updated: 05/16/2009 Pt is married.- 3 adult children no tobacco use no alcohol no recreational drug use Pt is unemployeed.  Risk Factors: Alcohol Use: 0 (01/22/2009) Caffeine Use: 2 (01/22/2009) Exercise: no (01/22/2009)  Risk Factors: Smoking Status: never (01/22/2009)  Vital Signs:  Patient profile:   70 year old female Height:      61 inches Weight:      262.75 pounds BMI:      49.83 Pulse rate:   135 / minute BP sitting:   130 / 80  (left arm) Cuff size:   regular  Vitals Entered By: Caralee Ates CMA (October 15, 2010 12:03 PM)  Physical Exam  General:  The patient was alert and oriented in no acute distress.Neck veins were flat, carotids were brisk. Lungs were clear. Heart sounds were ifast and regular. Abdomen was soft with active bowel sounds. There is no clubbing cyanosis or edema.    EKG  Procedure date:  10/15/2010  Findings:      atrial flutter at 1:15 beats per minute. Intervals-slice 0.07/0.25 Axis is 32. Flutter waves appear to be typical  Impression & Recommendations:  Problem # 1:  ATRIAL FIBRILLATION, PAROXYSMAL (ICD-427.31) the patient has paroxysmal atrial fibrillation as well as atrial flutter. A couple strategies present themselves. I think first and foremost it probably makes sense to consider flutter ablation.However, her INR is subtherapeutic  and so we will need to undertake TEE guided cardioversion. We could then consider catheter ablation of her flutter isthmus and wait to see about the recurrences of atrial fibrillation.  She has CT oirdereed for tomorroe at [facility] so will schedule for thrusday  Her updated medication list for this problem includes:    Coumadin 5 Mg Tabs (Warfarin sodium) ..... Once daily    Metoprolol Tartrate 100 Mg Tabs (Metoprolol tartrate) .Marland Kitchen... Take one tablet two times a day  Orders: EKG w/ Interpretation (93000) TLB-BMP (Basic Metabolic Panel-BMET) (80048-METABOL) TLB-CBC Platelet - w/Differential (85025-CBCD) TLB-PT (Protime) (85610-PTP) TLB-PTT (85730-PTTL)  Problem # 2:  DIASTOLIC HEART FAILURE, ACUTE ON CHRONIC (ICD-428.33)  as above Her updated medication list for this problem includes:    Coumadin 5 Mg Tabs (Warfarin sodium) ..... Once daily    Tiazac 240 Mg Xr24h-cap (Diltiazem hcl er beads) .Marland Kitchen... 1 capsule daily    Maxzide-25 37.5-25 Mg Tabs (Triamterene-hctz) .Marland Kitchen... 1 tab in the am     Lisinopril 40 Mg Tabs (Lisinopril) ..... Once a day    Metoprolol Tartrate 100 Mg Tabs (Metoprolol tartrate) .Marland Kitchen... Take one tablet two times a day    Furosemide 20 Mg Tabs (Furosemide) .Marland Kitchen... As needed  Orders: TLB-BMP (Basic Metabolic Panel-BMET) (80048-METABOL) TLB-CBC Platelet - w/Differential (85025-CBCD) TLB-PT (Protime) (85610-PTP) TLB-PTT (85730-PTTL)  Patient Instructions: 1)  Your physician has recommended that you have a cardioversion (DCCV).  Electrical cardioversion uses a jolt of electricity to your heart either through paddles or wired patches attached to your chest. This is a controlled, usually prescheduled, procedure. Defibrillation is done under light anesthesia in the hospital, and you usually go home the day of the procedure. This is done to get your heart back into a normal rhythm. You are not awake for the procedure. Please see the instruction sheet given to you today. 2)  Your physician has requested that you have a TEE.  During a TEE, sound waves are used to create images of your heart. It provides your doctor with information about the size and shape of your heart and how well your heart's chambers and valves are working. In this test, a transducer is attached to the end of a flexible tube that's guided down your throat and into your esophagus (the tube leading from your mouth to your stomach) to  get a more detailed image of your heart. You are not awake for the procedure. Please see the instruction sheet given to you today.  For further information please visit [email or web address] 3)  Your physician recommends that you continue on your current medications as directed. Please refer to the Current Medication list given to you today. 4)  Your physician recommends that you have labs today. PT/PTT/CBC w/DIFF, BMET.

## 2010-11-21 NOTE — Assessment & Plan Note (Signed)
Summary: eph  Medications Added METOPROLOL TARTRATE 100 MG TABS (METOPROLOL TARTRATE) take one tablet two times a day FUROSEMIDE 20 MG TABS (FUROSEMIDE) 1 tab every day      Allergies Added: NKDA  Referring Provider:  Graciela Husbands Primary Provider:  [facility]  CC:  eph/.  History of Present Illness: [name] is seen following a hospitalization in December for episode of atrial fibrillation that was recurrent. She was treated with flecainide and converted to sinus rhythm. She has been maintaining sinus rhythm since.  Until just recently she has had no significant swelling. She does however notice recent peripheral edema concurrent with mild worsening in her exercise intolerance which is always evident.  Review of her hospitalization records from December 2010 demonstrate no evaluation of LV function at that time. In 2007 she undergone echo and Myoview scanning both of which demonstrated normal left ventricular function and the latter no evidence of ischemia.  She denies chest pain or palpitations.  She has had an intercurrent episode of bronchitis.  Current Medications (verified): 1)  Coumadin 5 Mg Tabs (Warfarin Sodium) .... Once Daily 2)  Tiazac 240 Mg Xr24h-Cap (Diltiazem Hcl Er Beads) .Marland Kitchen.. 1 Capsule Daily 3)  Maxzide-25 37.5-25 Mg Tabs (Triamterene-Hctz) .Marland Kitchen.. 1 Tab in The Am 4)  Crestor 20 Mg Tabs (Rosuvastatin Calcium) .... Once A Day 5)  Lisinopril 40 Mg Tabs (Lisinopril) .... Once A Day 6)  Metoprolol Tartrate 100 Mg Tabs (Metoprolol Tartrate) .... Take One Tablet Two Times A Day  Allergies (verified): No Known Drug Allergies  Past History:  Past Medical History: Last updated: 05/16/2009 Current Problems:  ATRIAL FIBRILLATION, PAROXYSMAL (ICD-427.31) HYPERTENSION (ICD-401.9) SYNCOPE, HX OF (ICD-V12.49) HYPERLIPIDEMIA (ICD-272.4) DM (ICD-250.00) OBESITY, MORBID (ICD-278.01) GOUT (ICD-274.9) ACHILLES TENDINITIS (ICD-726.71) OBSTRUCTIVE SLEEP APNEA  (ICD-327.23)  Past Surgical History: Last updated: 05/16/2009 hysterectomy-30 yrs ago tonsilectomy- Appendectomy-30 yrs ago Cholecystectomy-8 yrs ago  Family History: Last updated: 05/16/2009  Positive for diabetes, both sides of her family.  Mother  died of a head injury after a fall in the shower.  Father has coronary  artery disease and is status post coronary artery bypass grafting x3.  Social History: Last updated: 05/16/2009 Pt is married.- 3 adult children no tobacco use no alcohol no recreational drug use Pt is unemployeed.  Vital Signs:  Patient profile:   70 year old female Height:      61 inches Weight:      263 pounds BMI:     49.87 Pulse rate:   64 / minute Pulse rhythm:   regular BP sitting:   163 / 80  (left arm) Cuff size:   large  Vitals Entered By: Judithe Modest CMA (Feb 20, 2010 3:24 PM)  Physical Exam  General:  The patient was alert and oriented in no acute distress. morbidily obese HEENT Normal.  Neck veins were flat, carotids were brisk.  Lungs were clear.  Heart sounds were regular without murmurs or gallops.  Abdomen was soft with active bowel sounds. There is no clubbing cyanosis  trace edema Skin Warm and dry neuor grossly normal   EKG  Procedure date:  02/20/2010  Findings:      sinus rhythm at 64 Intervals 0.18/0.08/24 for Axis is 2 Otherwise normal  CXR  Procedure date:  02/20/2010  Findings:      mild increase in the CT ratio as well as some cephalization of vessels consistent with congestive heart failure  Impression & Recommendations:  Problem # 1:  ATRIAL FIBRILLATION, PAROXYSMAL (ICD-427.31) she  is paroxysmal atrial fibrillation and is holding sinus rhythm. Her Coumadin is being monitored by [facility]    Coumadin 5 Mg Tabs (Warfarin sodium) ..... Once daily    Metoprolol Tartrate 100 Mg Tabs (Metoprolol tartrate) .Marland Kitchen... Take one tablet two times a day  Orders: EKG w/ Interpretation (93000)  Problem # 2:   HYPERTENSION (ICD-401.9) Blood pressure is elevated but just took her medicines Her updated medication list for this problem includes:    Tiazac 240 Mg Xr24h-cap (Diltiazem hcl er beads) .Marland Kitchen... 1 capsule daily    Maxzide-25 37.5-25 Mg Tabs (Triamterene-hctz) .Marland Kitchen... 1 tab in the am    Lisinopril 40 Mg Tabs (Lisinopril) ..... Once a day    Metoprolol Tartrate 100 Mg Tabs (Metoprolol tartrate) .Marland Kitchen... Take one tablet two times a day    Furosemide 20 Mg Tabs (Furosemide) .Marland Kitchen... 1 tab every day  Problem # 3:  OBSTRUCTIVE SLEEP APNEA (ICD-327.23) treated with CPAP  Problem # 4:  DIASTOLIC HEART FAILURE, ACUTE ON CHRONIC (ICD-428.33) chest x-ray is supportive of the diagnosis of congestive heart failure as is her examination. We'll begin her on her furosemide today and then daily for the next 5 days. She is to take potassium at the same time. She is to let us know next week I she's feeling or sooner if her symptoms worsen.  We have also checked a BMP today as well as a troponin in the event thatt the latter is abnormal she'll need to be hospitalized Her updated medication list for this problem includes:    Coumadin 5 Mg Tabs (Warfarin sodium) ..... Once daily    Tiazac 240 Mg Xr24h-cap (Diltiazem hcl er beads) .Marland Kitchen... 1 capsule daily    Maxzide-25 37.5-25 Mg Tabs (Triamterene-hctz) .Marland Kitchen... 1 tab in the am    Lisinopril 40 Mg Tabs (Lisinopril) ..... Once a day    Metoprolol Tartrate 100 Mg Tabs (Metoprolol tartrate) .Marland Kitchen... Take one tablet two times a day    Furosemide 20 Mg Tabs (Furosemide) .Marland Kitchen... 1 tab every day  Patient Instructions: 1)  Your physician has recommended you make the following change in your medication: start Lasix 20mg  every day in the am 2)  Your physician recommends that you schedule a follow-up appointment in: 12 months Prescriptions: FUROSEMIDE 20 MG TABS (FUROSEMIDE) 1 tab every day  #30 x 11   Entered by:   Layne Benton, RN, BSN   Authorized by:   Nathen May, MD,  [facility]   Signed by:   Layne Benton, RN, BSN on 02/20/2010   Method used:   Electronically to        [facility] [phone]* (retail)       [address]       [place], [place]  [address]       Ph: [phone]       Fax: [phone]   RxID:   [id number]

## 2010-11-21 NOTE — Assessment & Plan Note (Signed)
Summary: rov for osa   Copy to:  Graciela Husbands Primary Provider/Referring Provider:  Dala Dock  CC:  Pt is here for a 1 yr f/u appt.  Pt states she is wearing her cpap machine every night.  Approx 4 hours per night.  Pt c/o mask causing sore spots on face.  Marland Kitchen  History of Present Illness: The pt comes in today for f/u of her osa, which is being treated with cpap.  She has been wearing her device compliantly, and has no issues with her pressure level.  However, she continues to have problems with mask fitting, and has had to pull mask tight in order to keep from leaking.  This in turn has led to pressure spots on her face.  She has tried another mask recently which was actually worse.  Medications Prior to Update: 1)  Coumadin 5 Mg Tabs (Warfarin Sodium) .... Once Daily 2)  Tiazac 240 Mg Xr24h-Cap (Diltiazem Hcl Er Beads) .Marland Kitchen.. 1 Capsule Daily 3)  Maxzide-25 37.5-25 Mg Tabs (Triamterene-Hctz) .Marland Kitchen.. 1 Tab in The Am 4)  Crestor 20 Mg Tabs (Rosuvastatin Calcium) .... Once A Day 5)  Lisinopril 40 Mg Tabs (Lisinopril) .... Once A Day 6)  Metoprolol Tartrate 100 Mg Tabs (Metoprolol Tartrate) .... Take One Tablet Two Times A Day 7)  Furosemide 20 Mg Tabs (Furosemide) .Marland Kitchen.. 1 Tab Every Day  Allergies (verified): No Known Drug Allergies  Review of Systems      See HPI  Vital Signs:  Patient profile:   71 year old female Height:      61 inches Weight:      264 pounds O2 Sat:      96 % on Room air Temp:     97.6 degrees F oral Pulse rate:   65 / minute BP sitting:   126 / 84  (right arm) Cuff size:   large  Vitals Entered By: Arman Filter LPN (Feb 25, 1609 9:35 AM)  O2 Flow:  Room air CC: Pt is here for a 1 yr f/u appt.  Pt states she is wearing her cpap machine every night.  Approx 4 hours per night.  Pt c/o mask causing sore spots on face.   Comments Medications reviewed with patient Arman Filter LPN  Feb 26, 9603 9:35 AM    Physical Exam  General:  obese female in nad Nose:  no skin  breakdown or pressure necrosis from cpap mask Neurologic:  no sleepy, alert, moves all 4.   Impression & Recommendations:  Problem # 1:  OBSTRUCTIVE SLEEP APNEA (ICD-327.23) the pt is wearing cpap compliantly, but is having a lot of mask issues that prevents optimal treatment.  At this point, will get her to sleep center for formal mask fitting, and I have also stressed to her the importance of weight reduction.  I suspect that if we can get a better fit, she will wear cpap for a longer period of time.  Other Orders: Est. Patient Level II (54098) Sleep Disorder Referral (Sleep Disorder)  Patient Instructions: 1)  will get to sleep center for mask fitting. 2)  continue to work on weight loss 3)  followup with me in one year.

## 2010-11-21 NOTE — Progress Notes (Signed)
Summary: question re med   Phone Note Call from Patient   Caller: Patient [phone] Reason for Call: Talk to Nurse Summary of Call: pt has questions re medication precribed at the hospital  Initial call taken by: Glynda Jaeger,  July 09, 2010 2:13 PM  Follow-up for Phone Call        spoke w/pt and need more pills. will get a prescription and and send refill to [facility] on 29. Claris Gladden, RN, BSN Follow-up by: Nathen May, MD, [facility],  July 09, 2010 5:41 PM  Additional Follow-up for Phone Call Additional follow up Details #1::        this is for pill on the pocket  dosed 300 mg at a time  Additional Follow-up by: Nathen May, MD, [facility],  July 09, 2010 5:43 PM     Appended Document: question re med explained to Ms Danish the process of taking 300mg  and if no change in 24h to seek emergency assistance from ED. Claris Gladden, RN, BSN

## 2010-11-21 NOTE — Progress Notes (Signed)
Summary: pt's son calling re info on pt's procedure      Phone Note Call from Patient   Caller: Son [phone] [facility] Reason for Call: Talk to Nurse Summary of Call: pt's son calling re procedure pt to have, english is pt's 2nd language and doesn't quite understand the procedure=son calling to get the info and explain to mom-pls call Initial call taken by: Glynda Jaeger,  November 04, 2010 10:56 AM  Follow-up for Phone Call        explained the procedure and risks to pt son and he expressed understanding. he will explain to his mother and advise.  Follow-up by: Claris Gladden RN,  November 04, 2010 11:23 AM

## 2010-11-21 NOTE — Progress Notes (Signed)
Summary: pt has med question   Phone Note Call from Patient   Caller: Patient [phone] Reason for Call: Talk to Nurse Summary of Call: pt has med question, can she take furosemide and triamt-hctz together? Initial call taken by: Glynda Jaeger,  October 03, 2010 3:14 PM  Follow-up for Phone Call        pt was diagnosed with Shingles last night and was given Neurotin 300mg  three times a day. She wanted to confirm this was ok to take. Adv ok. Also pt concerned that urinating so much with just the Maxzide that she didn't need the Furosemide daily.  She denies have edema and SHOB. Adv pt that if need Furosemide to take. Pt has f/u appt in Feb.

## 2010-11-21 NOTE — Letter (Signed)
Summary: Cardioversion/TEE Instructions  Architectural technologist, Main Office  [address]   [place], [place] [address]   Phone: [phone]  Fax: [phone]    Cardioversion / TEE Cardioversion Instructions  10/15/2010 MRN: [id number]  [name] [address] [name], [place]  [address]  Dear Ms. Stalder, You are scheduled for a Cardioversion / TEE Cardioversion on October 17, 2010 at 10:30 am  with Dr.Ross.   Please arrive at the [facility] at 8:30 a.m.on the day of your procedure.     1)   Lab work today.   3)   MAKE SURE YOU TAKE YOUR COUMADIN.  4)   A)   DO NOT TAKE these medications before your procedure:      Furosemide and Potassium Chloride.   B)   YOU MAY TAKE ALL of your remaining medications with a small amount of water.    5)  Must have a responsible person to drive you home.  6)   Bring a current list of your medications and current insurance cards.   * Special Note:  Every effort is made to have your procedure done on time. Occasionally there are emergencies that present themselves at the hospital that may cause delays. Please be patient if a delay does occur.  * If you have any questions after you get home, please call the office at [phone]. Claris Gladden, RN

## 2010-11-21 NOTE — Progress Notes (Signed)
  Phone Note Call from Patient   Caller: Patient Reason for Call: Talk to Nurse Summary of Call: patient states she has had red itchy eyes with red bumps around the outside for a week.Marland KitchenMarland KitchenShe went to the pharmacy and they gave her expensive allergy pills that are not helping. She states the red bumps are very itchy and painful.Marland KitchenMarland KitchenAppointment made in acute slot. Initial call taken by: Conchita Paris,  Feb 18, 2010 1:03 PM

## 2010-11-21 NOTE — Progress Notes (Signed)
Summary: triage/possible yeast infection  Phone Note Call from Patient   Caller: Patient Reason for Call: Talk to Nurse Summary of Call: Patient states she has very painful irritation at panty line on legs..States it is burning and itching.Marland KitchenMarland KitchenPatient is obese and has been putting corn starch on area.Marland KitchenMarland KitchenAdvised patient this may be yeast and not to use corn starch any longer.Marland KitchenMarland KitchenWash area carefully with warm water and a mild soap like dove...rinse and pat down.Marland KitchenMarland KitchenUse a hair dryer carefully on a cool setting to completely dry the area.Marland KitchenMarland KitchenTry not to wear panties and let this area stay dry and cool..Yeast tends to grow in warm, moist and dry places..She can go to drugstore and get OTC anti fungal cream...talk to the phamacist about which one..Patient stil having "red spots" on her face...appointment rescheduled from Monday 5/23 to Wednesday 5/10. Patient states understanding. Initial call taken by: Conchita Paris,  Mar 05, 2010 11:42 AM

## 2010-11-21 NOTE — Progress Notes (Signed)
Summary: palps   Phone Note Call from Patient Call back at Home Phone [phone]   Caller: Patient Summary of Call: pt was told to call the ofc to make an appt to be seen. I offered her PA appt of 1/4 and she refused it stating that she was still having palps. pt states she was given 2 pills in the er and been having palps every since. I told pt that I would send a message to the nurse. Also told her if her symptoms got worse she needed to return to ER Initial call taken by: Edman Circle,  October 15, 2010 8:52 AM  Follow-up for Phone Call        pt coming in at 1115.  Follow-up by: Claris Gladden RN,  October 15, 2010 9:47 AM

## 2010-11-21 NOTE — Progress Notes (Signed)
Summary: triage/possible bronchitis  Phone Note Call from Patient   Caller: Patient Reason for Call: Talk to Nurse Summary of Call: Patient states she woke up yesterday with tightness in her chest. She is 70 years old and is taking care of her father who was just discharged from the hospital with fluid in his lungs.  She says she had a flu shot. She is losing her voice and states she can hear herself wheeze when she breathes.Marland KitchenMarland KitchenShe hasn't slept all night and has a says "I have a very tight chest".  She has a history of A-fib and is on coumadin therapy.  She denies cough.Marland KitchenMarland KitchenShe also has a history of HTN, DM, obesity, gout and sleep apena.Marland KitchenMarland KitchenAppointment made in [facility] Schedule today. Initial call taken by: Conchita Paris,  November 30, 2009 9:31 AM

## 2010-11-25 ENCOUNTER — Encounter (INDEPENDENT_AMBULATORY_CARE_PROVIDER_SITE_OTHER): Payer: Self-pay | Admitting: *Deleted

## 2010-11-25 ENCOUNTER — Encounter: Payer: Self-pay | Admitting: Endocrinology

## 2010-11-25 ENCOUNTER — Other Ambulatory Visit: Payer: Medicare Other

## 2010-11-25 ENCOUNTER — Ambulatory Visit (INDEPENDENT_AMBULATORY_CARE_PROVIDER_SITE_OTHER): Payer: Medicare Other | Admitting: Endocrinology

## 2010-11-25 DIAGNOSIS — I10 Essential (primary) hypertension: Secondary | ICD-10-CM

## 2010-11-25 DIAGNOSIS — D3500 Benign neoplasm of unspecified adrenal gland: Secondary | ICD-10-CM

## 2010-11-26 ENCOUNTER — Encounter: Payer: Self-pay | Admitting: Internal Medicine

## 2010-11-26 ENCOUNTER — Ambulatory Visit: Payer: Self-pay | Admitting: Internal Medicine

## 2010-11-26 ENCOUNTER — Ambulatory Visit (INDEPENDENT_AMBULATORY_CARE_PROVIDER_SITE_OTHER): Payer: Medicare Other | Admitting: Internal Medicine

## 2010-11-26 DIAGNOSIS — I4891 Unspecified atrial fibrillation: Secondary | ICD-10-CM

## 2010-11-27 ENCOUNTER — Other Ambulatory Visit: Payer: Self-pay | Admitting: Endocrinology

## 2010-11-28 ENCOUNTER — Other Ambulatory Visit: Payer: Self-pay | Admitting: Endocrinology

## 2010-11-28 DIAGNOSIS — D497 Neoplasm of unspecified behavior of endocrine glands and other parts of nervous system: Secondary | ICD-10-CM

## 2010-12-03 ENCOUNTER — Inpatient Hospital Stay (HOSPITAL_COMMUNITY): Admission: RE | Admit: 2010-12-03 | Payer: Medicare Other | Source: Ambulatory Visit

## 2010-12-05 NOTE — Assessment & Plan Note (Signed)
Summary: New Endo/Rt Adrenal adenoma/Medicare/#/LB   Vital Signs:  Patient profile:   70 year old female Menstrual status:  hysterectomy Height:      61 inches (154.94 cm) Weight:      264 pounds (120.00 kg) BMI:     50.06 O2 Sat:      96 % on Room air Temp:     97.9 degrees F (36.61 degrees C) oral Pulse rate:   60 / minute Pulse rhythm:   regular BP sitting:   132 / 72  (left arm) Cuff size:   large  Vitals Entered By: Brenton Grills CMA Duncan Dull) (November 25, 2010 4:07 PM)  O2 Flow:  Room air CC: New Endo Consult/Adrenal Adenoma/[facility]     Menstrual Status hysterectomy   Referring Provider:  Graciela Husbands Primary Provider:  Dala Dock  CC:  New Endo Consult/Adrenal Adenoma/[facility].  History of Present Illness: pt states 5 mos of severe pain at the right upper quadrant, but no assoc numbness.  in the eval of this, she was found to have an adrenal adenoma.    Current Medications (verified): 1)  Coumadin 5 Mg Tabs (Warfarin Sodium) .... Once Daily 2)  Tiazac 240 Mg Xr24h-Cap (Diltiazem Hcl Er Beads) .Marland Kitchen.. 1 Capsule Daily 3)  Maxzide-25 37.5-25 Mg Tabs (Triamterene-Hctz) .Marland Kitchen.. 1 Tab in The Am 4)  Crestor 20 Mg Tabs (Rosuvastatin Calcium) .... Once A Day 5)  Lisinopril 40 Mg Tabs (Lisinopril) .... Once A Day 6)  Metoprolol Tartrate 100 Mg Tabs (Metoprolol Tartrate) .... Take One Tablet Two Times A Day 7)  Furosemide 20 Mg Tabs (Furosemide) .... As Needed 8)  Potassium Chloride Crys Cr 20 Meq Cr-Tabs (Potassium Chloride Crys Cr) .... Take One Tablet By Mouth Daily  Allergies (verified): No Known Drug Allergies  Past History:  Past Medical History: Last updated: 05/16/2009 Current Problems:  ATRIAL FIBRILLATION, PAROXYSMAL (ICD-427.31) HYPERTENSION (ICD-401.9) SYNCOPE, HX OF (ICD-V12.49) HYPERLIPIDEMIA (ICD-272.4) DM (ICD-250.00) OBESITY, MORBID (ICD-278.01) GOUT (ICD-274.9) ACHILLES TENDINITIS (ICD-726.71) OBSTRUCTIVE SLEEP APNEA  (ICD-327.23)  Family History: Reviewed history from 05/16/2009 and no changes required.  Positive for diabetes, both sides of her family.  Mother  died of a head injury after a fall in the shower.  Father has coronary  artery disease and is status post coronary artery bypass grafting x3.  Social History: Reviewed history from 05/16/2009 and no changes required. Pt is married.- 3 adult children no tobacco use no alcohol no recreational drug use Pt is unemployed.  Review of Systems       denies headache, flushing, pallor, n/v, syncope, weight change, chest pain, sob, anxiety, visual loss, palpitations, sob, excessive diaphoresis, rhinorrhea, and easy bruising.  she has chronic diarrhea and arthralgias.  she attributes palpitations to atrial fibrillation.  Physical Exam  General:  morbidly obese.  no distress  Head:  head: no deformity eyes: no periorbital swelling, no proptosis external nose and ears are normal mouth: no lesion seen Neck:  Supple without thyroid enlargement or tenderness.  Lungs:  Clear to auscultation bilaterally. Normal respiratory effort.  Heart:  Regular rate and rhythm without murmurs or gallops noted. Normal S1,S2.   Abdomen:  abdomen is soft, nontender.  no hepatosplenomegaly.   not distended.  no hernia  Msk:  muscle bulk and strength are grossly normal.  no obvious joint swelling.  gait is normal and steady  Pulses:  dorsalis pedis intact bilat.  no carotid bruit  Extremities:  no deformity.  no edema Neurologic:  cn 2-12 grossly intact.  readily moves all 4's.   sensation is intact to touch on the feet  Skin:  normal texture and temp.  no rash.  not diaphoretic no ecchymoses. Cervical Nodes:  No significant adenopathy.  Psych:  Alert and cooperative; normal mood and affect; normal attention span and concentration.   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  Problem # 3:  abdominal pain not related to #1  Other Orders: Radiology Referral (Radiology) T-Urine 24 Hr. Metanephrines [phone]) T-Urine 24 Hr. Catecholamines [phone]) New Patient Level IV (29562)  Patient Instructions: 1)  check 24-hour urine for "adrenaline" 2)  recheck mri in april.  you will be called with a day and time for an appointment. 3)  please call [phone] to hear each of your test results. 4)  you should conclude that your pain is not related to the adrenal nodule.  you should continue to work with your regular doctor on this.   Orders Added: 1)  Radiology Referral [Radiology] 2)  T-Urine 24 Hr. Metanephrines [[id number]] 3)  T-Urine 24 Hr. Catecholamines [phone] 4)  New Patient Level IV [72536]

## 2010-12-05 NOTE — Assessment & Plan Note (Signed)
Summary: F6M/JML/r/s from bumplist/gd/kl      Allergies Added: NKDA  Referring Provider:  Graciela Husbands Primary Provider:  Dala Dock   History of Present Illness: [name] is seen in followup for atrial fibrillation and atrial flutter. She was last seen in December with anticipated TE guided cardioversion. She converted spontaneously.  She is on Coumadin given age gender and hypertension   in the past she has been treated with denied with cardioversion  In 2007 she undergone echo and Myoview scanning both of which demonstrated normal left ventricular function and the latter no evidence of ischemia.  She denies chest pain or palpitations.  B. of the issues are of pain in her side and in her foot which he says are both 11 of 10   Current Medications (verified): 1)  Coumadin 5 Mg Tabs (Warfarin Sodium) .... Once Daily 2)  Tiazac 240 Mg Xr24h-Cap (Diltiazem Hcl Er Beads) .Marland Kitchen.. 1 Capsule Daily 3)  Maxzide-25 37.5-25 Mg Tabs (Triamterene-Hctz) .Marland Kitchen.. 1 Tab in The Am 4)  Crestor 20 Mg Tabs (Rosuvastatin Calcium) .... Once A Day 5)  Lisinopril 40 Mg Tabs (Lisinopril) .... Once A Day 6)  Metoprolol Tartrate 100 Mg Tabs (Metoprolol Tartrate) .... Take One Tablet Two Times A Day 7)  Furosemide 20 Mg Tabs (Furosemide) .... As Needed 8)  Potassium Chloride Crys Cr 20 Meq Cr-Tabs (Potassium Chloride Crys Cr) .... Take One Tablet By Mouth Daily  Allergies (verified): No Known Drug Allergies  Past History:  Past Medical History: Last updated: 11/26/2010 Current Problems:  Newly diagnosed Adrenal adenoma-2012 ATRIAL FIBRILLATION, PAROXYSMAL (ICD-427.31) HYPERTENSION (ICD-401.9) SYNCOPE, HX OF (ICD-V12.49) HYPERLIPIDEMIA (ICD-272.4) DM (ICD-250.00) OBESITY, MORBID (ICD-278.01) GOUT (ICD-274.9) ACHILLES TENDINITIS (ICD-726.71) OBSTRUCTIVE SLEEP APNEA (ICD-327.23)  Vital Signs:  Patient profile:   70 year old female Height:      61 inches Weight:      264.25 pounds BMI:      50.11 Pulse rate:   54 / minute Pulse rhythm:   regular Resp:     18 per minute BP sitting:   125 / 68  (left arm) Cuff size:   large  Vitals Entered By: Vikki Ports (November 26, 2010 2:23 PM)  Physical Exam  General:  The patient was morbidly obese Hispanic female who is alert and oriented in no acute distress.Neck veins were flat, carotids were brisk. Lungs were clear. Heart sounds were ifast and regular. Abdomen was soft with active bowel sounds. There is no clubbing cyanosis or edema.    Impression & Recommendations:  Problem # 1:  ATRIAL FIBRILLATION, PAROXYSMAL (ICD-427.31) she is paroxysmal atrial fibrillation and is currently on Coumadin. Given the relative frequency of events, we will plan to undertake drug therapy. In the event that she has reversion to atrial for ablation, she could either be given oral flecainide to help convert or undergo cardioversion , the timing of which would be depending on her INR status.   Her updated medication list for this problem includes:    Coumadin 5 Mg Tabs (Warfarin sodium) ..... Once daily    Metoprolol Tartrate 100 Mg Tabs (Metoprolol tartrate) .Marland Kitchen... Take one tablet two times a day  Orders: EKG w/ Interpretation (93000)  Problem # 2:  HYPERTENSION (ICD-401.9) well-controlled Her updated medication list for this problem includes:    Tiazac 240 Mg Xr24h-cap (Diltiazem hcl er beads) .Marland Kitchen... 1 capsule daily    Maxzide-25 37.5-25 Mg Tabs (Triamterene-hctz) .Marland Kitchen... 1 tab in the am    Lisinopril 40 Mg Tabs (Lisinopril) .Marland KitchenMarland KitchenMarland KitchenMarland Kitchen  Once a day    Metoprolol Tartrate 100 Mg Tabs (Metoprolol tartrate) .Marland Kitchen... Take one tablet two times a day    Furosemide 20 Mg Tabs (Furosemide) .Marland Kitchen... As needed  Patient Instructions: 1)  Your physician wants you to follow-up in:6 months   You will receive a reminder letter in the mail two months in advance. If you don't receive a letter, please call our office to schedule the follow-up appointment.

## 2010-12-06 ENCOUNTER — Ambulatory Visit (HOSPITAL_COMMUNITY)
Admission: RE | Admit: 2010-12-06 | Discharge: 2010-12-06 | Disposition: A | Discharge status: Home / Self Care | Payer: Medicare Other | Source: Ambulatory Visit | Attending: Family Medicine | Admitting: Family Medicine

## 2010-12-06 ENCOUNTER — Encounter: Payer: Self-pay | Admitting: Endocrinology

## 2010-12-06 ENCOUNTER — Other Ambulatory Visit (HOSPITAL_COMMUNITY): Payer: Self-pay | Admitting: Family Medicine

## 2010-12-06 ENCOUNTER — Ambulatory Visit (HOSPITAL_COMMUNITY)
Admission: RE | Admit: 2010-12-06 | Discharge: 2010-12-06 | Disposition: A | Discharge status: Home / Self Care | Payer: Medicare Other | Source: Ambulatory Visit | Attending: Endocrinology | Admitting: Endocrinology

## 2010-12-06 ENCOUNTER — Encounter (HOSPITAL_COMMUNITY): Payer: Self-pay

## 2010-12-06 DIAGNOSIS — D497 Neoplasm of unspecified behavior of endocrine glands and other parts of nervous system: Secondary | ICD-10-CM

## 2010-12-06 DIAGNOSIS — M546 Pain in thoracic spine: Secondary | ICD-10-CM | POA: Insufficient documentation

## 2010-12-06 DIAGNOSIS — R160 Hepatomegaly, not elsewhere classified: Secondary | ICD-10-CM | POA: Insufficient documentation

## 2010-12-06 DIAGNOSIS — R52 Pain, unspecified: Secondary | ICD-10-CM

## 2010-12-06 DIAGNOSIS — D3500 Benign neoplasm of unspecified adrenal gland: Secondary | ICD-10-CM | POA: Insufficient documentation

## 2010-12-06 DIAGNOSIS — K862 Cyst of pancreas: Secondary | ICD-10-CM | POA: Insufficient documentation

## 2010-12-06 MED ORDER — GADOBENATE DIMEGLUMINE 529 MG/ML IV SOLN
20.0000 mL | Freq: Once | INTRAVENOUS | Status: AC | PRN
  Administered 2010-12-06: 20 mL via INTRAVENOUS

## 2010-12-18 ENCOUNTER — Other Ambulatory Visit (HOSPITAL_COMMUNITY): Payer: Self-pay | Admitting: Family Medicine

## 2010-12-18 DIAGNOSIS — R109 Unspecified abdominal pain: Secondary | ICD-10-CM

## 2010-12-25 ENCOUNTER — Other Ambulatory Visit (HOSPITAL_COMMUNITY): Payer: Medicare Other

## 2010-12-30 LAB — CBC
HCT: 47 % — ABNORMAL HIGH (ref 36.0–46.0)
Hemoglobin: 15.4 g/dL — ABNORMAL HIGH (ref 12.0–15.0)
MCH: 26.7 pg (ref 26.0–34.0)
MCV: 81.6 fL (ref 78.0–100.0)
RBC: 5.76 MIL/uL — ABNORMAL HIGH (ref 3.87–5.11)

## 2010-12-30 LAB — PROTIME-INR: INR: 1.64 — ABNORMAL HIGH (ref 0.00–1.49)

## 2010-12-30 LAB — BASIC METABOLIC PANEL
CO2: 29 mEq/L (ref 19–32)
Calcium: 9.1 mg/dL (ref 8.4–10.5)
Chloride: 101 mEq/L (ref 96–112)
GFR calc Af Amer: >60 mL/min (ref >60)
Potassium: 3.3 mEq/L — ABNORMAL LOW (ref 3.5–5.1)
Sodium: 139 mEq/L (ref 135–145)

## 2010-12-30 LAB — DIFFERENTIAL
Eosinophils Absolute: 0.2 10*3/uL (ref 0.0–0.7)
Lymphs Abs: 2.5 10*3/uL (ref 0.7–4.0)
Monocytes Absolute: 1.5 10*3/uL — ABNORMAL HIGH (ref 0.1–1.0)
Monocytes Relative: 12 % (ref 3–12)
Neutrophils Relative %: 65 % (ref 43–77)

## 2010-12-30 LAB — POCT CARDIAC MARKERS
CKMB, poc: 3.4 ng/mL (ref 1.0–8.0)
CKMB, poc: 3.5 ng/mL (ref 1.0–8.0)
Troponin i, poc: <0.05 ng/mL (ref 0.00–0.09)

## 2010-12-31 LAB — POCT URINALYSIS DIPSTICK
Bilirubin Urine: NEGATIVE
Glucose, UA: NEGATIVE mg/dL
Specific Gravity, Urine: 1.025 (ref 1.005–1.030)
Urobilinogen, UA: 0.2 mg/dL (ref 0.0–1.0)

## 2011-01-03 LAB — DIFFERENTIAL
Eosinophils Absolute: 0.3 10*3/uL (ref 0.0–0.7)
Lymphocytes Relative: 24 % (ref 12–46)
Lymphocytes Relative: 26 % (ref 12–46)
Lymphs Abs: 3 10*3/uL (ref 0.7–4.0)
Lymphs Abs: 3.6 10*3/uL (ref 0.7–4.0)
Neutrophils Relative %: 62 % (ref 43–77)
Neutrophils Relative %: 59 % (ref 43–77)

## 2011-01-03 LAB — CBC
Hemoglobin: 14.4 g/dL (ref 12.0–15.0)
MCHC: 32.7 g/dL (ref 30.0–36.0)
Platelets: 335 10*3/uL (ref 150–400)
RBC: 5.39 MIL/uL — ABNORMAL HIGH (ref 3.87–5.11)
RBC: 4.82 MIL/uL (ref 3.87–5.11)
WBC: 13.7 10*3/uL — ABNORMAL HIGH (ref 4.0–10.5)

## 2011-01-03 LAB — POCT CARDIAC MARKERS
Myoglobin, poc: 58.2 ng/mL (ref 12–200)
Troponin i, poc: <0.05 ng/mL (ref 0.00–0.09)
Troponin i, poc: <0.05 ng/mL (ref 0.00–0.09)

## 2011-01-03 LAB — POCT I-STAT, CHEM 8
Calcium, Ion: 1.08 mmol/L — ABNORMAL LOW (ref 1.12–1.32)
Chloride: 102 mEq/L (ref 96–112)
HCT: 40 % (ref 36.0–46.0)
Hemoglobin: 13.6 g/dL (ref 12.0–15.0)
Potassium: 3.3 mEq/L — ABNORMAL LOW (ref 3.5–5.1)

## 2011-01-03 LAB — URINE MICROSCOPIC-ADD ON

## 2011-01-03 LAB — URINALYSIS, ROUTINE W REFLEX MICROSCOPIC
Glucose, UA: NEGATIVE mg/dL
Specific Gravity, Urine: 1.008 (ref 1.005–1.030)

## 2011-01-03 LAB — PROTIME-INR
INR: 2.33 — ABNORMAL HIGH (ref 0.00–1.49)
INR: 2.51 — ABNORMAL HIGH (ref 0.00–1.49)
INR: 2.36 — ABNORMAL HIGH (ref 0.00–1.49)
Prothrombin Time: 25.7 seconds — ABNORMAL HIGH (ref 11.6–15.2)
Prothrombin Time: 27.2 seconds — ABNORMAL HIGH (ref 11.6–15.2)
Prothrombin Time: 25.9 seconds — ABNORMAL HIGH (ref 11.6–15.2)

## 2011-01-03 LAB — MAGNESIUM: Magnesium: 1.8 mg/dL (ref 1.5–2.5)

## 2011-01-03 LAB — GLUCOSE, CAPILLARY

## 2011-01-03 LAB — COMPREHENSIVE METABOLIC PANEL
ALT: 17 U/L (ref 0–35)
AST: 19 U/L (ref 0–37)
CO2: 29 mEq/L (ref 19–32)
Calcium: 9.1 mg/dL (ref 8.4–10.5)
Chloride: 100 mEq/L (ref 96–112)
GFR calc Af Amer: >60 mL/min (ref >60)
GFR calc non Af Amer: >60 mL/min (ref >60)
Sodium: 140 mEq/L (ref 135–145)

## 2011-01-03 LAB — RAPID URINE DRUG SCREEN, HOSP PERFORMED: Barbiturates: NOT DETECTED

## 2011-01-15 ENCOUNTER — Encounter (INDEPENDENT_AMBULATORY_CARE_PROVIDER_SITE_OTHER): Payer: Self-pay | Admitting: *Deleted

## 2011-01-15 LAB — CONVERTED CEMR LAB
Albumin: 4 g/dL (ref 3.5–5.2)
Alkaline Phosphatase: 91 units/L (ref 39–117)
BUN: 12 mg/dL (ref 6–23)
Glucose, Bld: 124 mg/dL — ABNORMAL HIGH (ref 70–99)
Potassium: 4.1 meq/L (ref 3.5–5.3)

## 2011-01-20 LAB — URINALYSIS, ROUTINE W REFLEX MICROSCOPIC
Glucose, UA: NEGATIVE mg/dL
Ketones, ur: NEGATIVE mg/dL
Leukocytes, UA: NEGATIVE
Protein, ur: 30 mg/dL — AB
pH: 7.5 (ref 5.0–8.0)

## 2011-01-20 LAB — DIFFERENTIAL
Eosinophils Absolute: 0.3 10*3/uL (ref 0.0–0.7)
Eosinophils Relative: 2 % (ref 0–5)
Lymphocytes Relative: 27 % (ref 12–46)
Lymphs Abs: 3.6 10*3/uL (ref 0.7–4.0)
Monocytes Absolute: 1.3 10*3/uL — ABNORMAL HIGH (ref 0.1–1.0)
Monocytes Relative: 10 % (ref 3–12)

## 2011-01-20 LAB — POCT CARDIAC MARKERS: Myoglobin, poc: 72.7 ng/mL (ref 12–200)

## 2011-01-20 LAB — URINE MICROSCOPIC-ADD ON

## 2011-01-20 LAB — BASIC METABOLIC PANEL
BUN: 14 mg/dL (ref 6–23)
BUN: 15 mg/dL (ref 6–23)
CO2: 28 mEq/L (ref 19–32)
Chloride: 103 mEq/L (ref 96–112)
Chloride: 99 mEq/L (ref 96–112)
Creatinine, Ser: 0.67 mg/dL (ref 0.4–1.2)
GFR calc Af Amer: >60 mL/min (ref >60)
GFR calc Af Amer: >60 mL/min (ref >60)
Potassium: 3.1 mEq/L — ABNORMAL LOW (ref 3.5–5.1)
Sodium: 136 mEq/L (ref 135–145)

## 2011-01-20 LAB — LIPID PANEL
Cholesterol: 157 mg/dL (ref 0–200)
HDL: 54 mg/dL (ref >39)
LDL Cholesterol: 76 mg/dL (ref 0–99)
Total CHOL/HDL Ratio: 2.9 RATIO
Triglycerides: 135 mg/dL (ref <150)
VLDL: 27 mg/dL (ref 0–40)

## 2011-01-20 LAB — CBC
HCT: 40.5 % (ref 36.0–46.0)
Hemoglobin: 13.6 g/dL (ref 12.0–15.0)
MCV: 80.9 fL (ref 78.0–100.0)
RBC: 5 MIL/uL (ref 3.87–5.11)
WBC: 13.4 10*3/uL — ABNORMAL HIGH (ref 4.0–10.5)

## 2011-01-20 LAB — RETICULOCYTES
RBC.: 5.02 MIL/uL (ref 3.87–5.11)
Retic Count, Absolute: 80.3 10*3/uL (ref 19.0–186.0)
Retic Ct Pct: 1.6 % (ref 0.4–3.1)

## 2011-01-24 LAB — PROTIME-INR
INR: 2.46 — ABNORMAL HIGH (ref 0.00–1.49)
Prothrombin Time: 26.5 seconds — ABNORMAL HIGH (ref 11.6–15.2)

## 2011-01-26 LAB — URINE MICROSCOPIC-ADD ON

## 2011-01-26 LAB — URINE CULTURE: Colony Count: >=100000

## 2011-01-26 LAB — DIFFERENTIAL
Lymphocytes Relative: 26 % (ref 12–46)
Lymphs Abs: 3.5 10*3/uL (ref 0.7–4.0)
Neutro Abs: 8 10*3/uL — ABNORMAL HIGH (ref 1.7–7.7)
Neutrophils Relative %: 60 % (ref 43–77)

## 2011-01-26 LAB — URINALYSIS, ROUTINE W REFLEX MICROSCOPIC
Glucose, UA: NEGATIVE mg/dL
Ketones, ur: NEGATIVE mg/dL
Protein, ur: NEGATIVE mg/dL
pH: 7 (ref 5.0–8.0)

## 2011-01-26 LAB — CBC
HCT: 41.9 % (ref 36.0–46.0)
Platelets: 329 10*3/uL (ref 150–400)
WBC: 13.5 10*3/uL — ABNORMAL HIGH (ref 4.0–10.5)

## 2011-01-26 LAB — BASIC METABOLIC PANEL
BUN: 16 mg/dL (ref 6–23)
Calcium: 8.9 mg/dL (ref 8.4–10.5)
Creatinine, Ser: 0.66 mg/dL (ref 0.4–1.2)
GFR calc non Af Amer: >60 mL/min (ref >60)
Potassium: 3.1 mEq/L — ABNORMAL LOW (ref 3.5–5.1)

## 2011-01-26 LAB — PROTIME-INR
INR: 2.1 — ABNORMAL HIGH (ref 0.00–1.49)
Prothrombin Time: 23.3 seconds — ABNORMAL HIGH (ref 11.6–15.2)

## 2011-01-26 LAB — APTT: aPTT: 35 seconds (ref 24–37)

## 2011-01-26 LAB — POCT CARDIAC MARKERS: Myoglobin, poc: 87.6 ng/mL (ref 12–200)

## 2011-02-06 ENCOUNTER — Other Ambulatory Visit (HOSPITAL_COMMUNITY): Payer: Self-pay | Admitting: Family Medicine

## 2011-02-06 DIAGNOSIS — Z1231 Encounter for screening mammogram for malignant neoplasm of breast: Secondary | ICD-10-CM

## 2011-02-24 ENCOUNTER — Ambulatory Visit (HOSPITAL_COMMUNITY): Payer: Medicare Other

## 2011-03-04 NOTE — Op Note (Signed)
NAMEABILENE, MCPHEE             ACCOUNT NO.:  [id number]   MEDICAL RECORD NO.:  [id number]          PATIENT TYPE:  AMB   LOCATION:  ENDO                         FACILITY:  [facility]   PHYSICIAN:  James L. Malon Kindle., M.D.DATE OF BIRTH:  1941/09/21   DATE OF PROCEDURE:  11/22/2008  DATE OF DISCHARGE:                               OPERATIVE REPORT   PROCEDURE:  Colonoscopy.   MEDICATIONS:  Fentanyl 125 mcg, Versed 12 mg IV.   SCOPE:  Pediatric Pentax colonoscope.   INDICATIONS:  Heme-positive stool without gross bleeding.  She is on  anticoagulation for atrial fibrillation.  Her Coumadin has been on hold  now for 3 days.   DESCRIPTION OF PROCEDURE:  Procedure had been explained to the patient  and consent obtained.  In the left lateral decubitus position, the  Pentax pediatric colonoscope was inserted and advanced.  Prep was  excellent and we were able to advance to the cecum with a bit of  abdominal pressure cocking the patient slightly supine.  The scope was  withdrawn from the cecum.  Ascending, transverse, descending, and  sigmoid colon were seen well.  The rectum was also seen well in forward  and retroflex view.  There were no AVMs, diverticula, polyps, tumors, or  any other obvious sources of bleeding.  There were minimal hemorrhoids.  Scope was withdrawn.  The patient tolerated the procedure well.   ASSESSMENT:  Heme-positive stool with no obvious cause on colonoscopy.   PLAN:  We will see the patient back in the office in 4-6 weeks and  recheck her stool.  We will resume Coumadin today.           ______________________________  Llana Aliment. Malon Kindle., M.D.     Waldron Session  D:  11/22/2008  T:  11/22/2008  Job:  [id number]   cc:   Dineen Kid. Reche Dixon, M.D.  Duke Salvia, MD, [facility]

## 2011-03-04 NOTE — Letter (Signed)
October 05, 2008    [facility]  [address]  [place], [place] [address]   RE:  [name], [name]  MRN:  [id number]  /  DOB:  08/26/1941   Dear Colleagues:   It was a pleasure to see [name] at your request.  As you know,  she is a nearly 70 year old [place] immigrant who has atrial  fibrillation that dates back a number of years and has been seen by  [facility] in the hospital over the last 4 or 5 years on 2 occasions  for atrial fibrillation with a rapid ventricular response.  It is  associated with tachy palpitations, lightheadedness, and shortness of  breath.  These episodes have been self-terminating.  The last episode  occurred about 7 months ago and then again a few weeks ago, her rates  were 140-150 on arrival to the emergency room.  Unfortunately, I do not  have records of the evaluated blood pressure.   She has been on long-standing Coumadin for her thromboembolic risk  factors which include hypertension, diabetes, and gender as well as age.  Her cardiac evaluation has also included Myoview scans on 2 occasions in  the last 5 years both of which were negative for ischemia and had normal  left ventricular function.  Notwithstanding, she still has exercise  tolerance and some peripheral edema.  She has been treated with  diuretics in the past and this was associated with a modest  hypopotassium on arrival to the emergency room the other day.   Her past related history is also notable for obstructive sleep apnea for  which she has a mask, which she has not been able to use for the last 6  or 7 months (see below).   I should note also this is related to her diabetes that she had a fall  by syncopal episodes some months ago.  Her blood sugar was taken, was in  the 40s, though she has not resumed her hypoglycemic therapy.   PAST SURGICAL HISTORY:  Notable for abdominal hysterectomy,  cholecystectomy, tonsillectomy, and adenoidectomy.   REVIEW OF SYSTEMS:   Notable for recent URI, also notable for significant  arthritis, problems with Achilles, peripheral edema, but is otherwise  negative for GI, GU, skin, endocrine, and general.   SOCIAL HISTORY:  L[place] in [place]  She has 3 children, one of her  sons is a Stage manager of the [employer] engineer, one lives in [place].  She has 12  grandchildren and 2 great grandchildren.  She does not consume alcohol  or recreational drugs.   MEDICATIONS:  Metoprolol tartrate 50 b.i.d., lisinopril 40, Crestor 20,  Maxzide 37.5/25, Tiazac 240, potassium 20, and Coumadin.   ALLERGIES:  She has no known drug allergies.   PHYSICAL EXAMINATION:  GENERAL:  She is a morbidly obese Hispanic woman.  Her weight was 250 pounds, and she is about 5 feet 1 inch tall.  Her  blood pressure is 150/72, her pulse is 64.  HEENT:  Demonstrated no evidence of xanthomata.  NECK:  Neck veins were flat.  The carotids were brisk and full  bilaterally without bruits.  There is no lymphadenopathy.  BACK:  Without kyphosis or scoliosis.  LUNGS:  Clear.  HEART:  Sounds were regular without murmurs or gallops.  ABDOMEN:  Soft with active bowel sounds.  Femoral pulses were 2+. Distal  pulses were intact.  There was no clubbing, cyanosis, or edema.  NEUROLOGIC:  Grossly normal.  SKIN:  Warm and dry.  Electrocardiogram dated today:  __________   IMPRESSION:  1. Paroxysmal atrial fibrillation with rapid ventricular response.  2. Thromboembolic risk factors noted for age, gender, and diabetes.  3. Morbid obesity.  4. Obstructive sleep apnea most likely due to paroxysmal atrial      fibrillation.  5. Negative Myoview in 2007.   DISCUSSION:  [name] has intermittent paroxysmal atrial  fibrillation occurring about every 2 months.  These episodes are self-  terminating and have been associated with rapid ventricular response and  have been quite symptomatic.  The options include p.r.n. medications  versus daily medications.  As these episodes  have been self-terminating,  my inclination is to actually do nothing except that see if she can  weather the episodes at home.  In the event that she has significant  symptoms, she is to call 911.  Otherwise, we will plan to just have her  take an extra Tiazac p.r.n. and see how that goes.  An alternative would  be to use p.r.n. antiarrhythmic drug therapy and that would be to give  her first dose in the hospital to be  Discussed that at her next visit  if the frequency of atrial fibrillationincreases.  I have reminded her that it is very important for her to follow through  on her sleep apnea therapy.  She is going to hopefully get it out of  storage in early January.   She may well need further titration of her sleep apnea therapy at this  time.  Thankfully, after access to healthcare, she should be eligible  for Medicare at the beginning of next year.   We will see her again in 4 months' time for followup.  Thank you for  this consultation.    Sincerely,      Duke Salvia, MD, [facility]  Electronically Signed    SCK/MedQ  DD: 10/05/2008  DT: 10/06/2008  Job #: [id number]

## 2011-03-04 NOTE — Procedures (Signed)
[name], [name] NO.:  [id number]   MEDICAL RECORD NO.:  [id number]          PATIENT TYPE:  OUT   LOCATION:  SLEEP CENTER                 FACILITY:  [facility]   PHYSICIAN:  Barbaraann Share, MD,FCCPDATE OF BIRTH:  31-Oct[birth year]1942   DATE OF STUDY:  10/29/2008                            NOCTURNAL POLYSOMNOGRAM   REFERRING PHYSICIAN:  Duke Salvia, MD, [facility]   LOCATION:  Sleep Lab.   INDICATION FOR STUDY:  Hypersomnia with sleep apnea.   EPWORTH SLEEPINESS SCORE:  9.   SLEEP ARCHITECTURE:  The patient had a total sleep time during the  diagnostic portion of the study of 190 minutes, and 184  minutes during  the titration portion.  There was no slow wave sleep reached during the  night, and only 38  minutes of REM.  Sleep onset latency was normal at  8.5 minutes and REM onset was very prolonged.  Sleep efficiency was 79%  during the diagnostic portion of the study, and 83% during the titration  portion.   RESPIRATORY DATA:  The patient underwent a split-night protocol where  she was found to have 54 obstructive events in the first 190 minutes of  sleep.  This gave her an apnea-hypopnea index of 17 events per hour  during the diagnostic portion of the study.  All of the events occurred  in the supine position, and there was loud snoring noted throughout.  By  protocol, the patient was placed on a small Quattro ResMed full-face  mask, and CPAP titration was initiated.  The patient was increased as  high as 14 cm of water, but it appeared that she had adequate control on  just 10 cm of water pressure.  I would use that as her initial  therapeutic pressure and follow her clinically.   OXYGEN DATA:  The patient had transient O2 desaturation as low as 85%  with her obstructive events.   CARDIAC DATA:  No clinically significant arrhythmias were noted.   MOVEMENT/PARASOMNIA:  The patient had no significant leg jerks or  abnormal behaviors seen.   IMPRESSIONS/RECOMMENDATIONS:  Split-night study reveals mild obstructive  sleep apnea during the diagnostic portion of the study with an  with an  apnea-hypopnea index of 17 events per hour and oxygen desaturation  transiently to 85%.  The patient was then placed on a ResMed small  Quattro full-face mask and found  to have an optimal pressure of 10 cm of water.  The patient should also  be encouraged to work aggressively on weight loss.      Barbaraann Share, MD,FCCP  Diplomate, American Board of Sleep  Medicine  Electronically Signed     [facility]/MEDQ  D:  11/07/2008 15:38:17  T:  11/08/2008 03:53:49  Job:  [id number]

## 2011-03-07 NOTE — Consult Note (Signed)
NAMECARL, [name] [name]             ACCOUNT NO.:  [id number]   MEDICAL RECORD NO.:  [id number]          PATIENT TYPE:  INP   LOCATION:  4731                         FACILITY:  [facility]   PHYSICIAN:  Mark E. Severiano Gilbert, M.D.    DATE OF BIRTH:  30-Sep-1941   DATE OF CONSULTATION:  DATE OF DISCHARGE:                                   CONSULTATION   NEW PATIENT CONSULTATION.   REASON FOR CONSULTATION:  Atrial fibrillation.   SOURCE OF CONSULTATION:  [name] [name], M.D.   HISTORY OF PRESENT ILLNESS:  This is a 70 year old Hispanic female with past  medical history of hypertension and hypercholesterolemia who has had several  episodes, short-lived, tachy palpitations over the last several months who  yesterday evening had the sudden onset of rapid heart rate associated with  palpitations and irregularity in heart rhythm.  She complained of shortness  of breath more with exertion, but no real angina.  She had no syncope.  After these persisted for approximately an hour to an hour and a half she  began to be concerned and decided to seek medical attention in the emergency  department.   She was seen in the emergency room.  An initial electrocardiogram  demonstrated atrial fibrillation with rapid ventricular response.  The  patient was placed on diltiazem drip and after several hours, she  spontaneously converted back to sinus rhythm.  She had been admitted to the  hospital for evaluation of her new onset of atrial fibrillation, although  now is in sinus rhythm.  Initial cardiac enzymes were negative for ischemia.   PAST MEDICAL HISTORY:  1.  Essential hypertension.  2.  Hyperlipidemia.  3.  Gouty arthritis.  4.  Osteoarthritis.  5.  She does have coronary artery disease risk factors present.   PAST SURGICAL HISTORY:  1.  She has had a hysterectomy secondary to dysfunctional uterine bleeding.  2.  Cholecystectomy.  3.  Tonsillectomy.   ALLERGIES:  She has none.   FAMILY HISTORY:   There is history of atherosclerotic coronary artery disease  in her father.   SOCIAL HISTORY:  She does not drink or smoke.   MEDICATIONS:  1.  Lisinopril 10 mg p.o. daily.  2.  Triamterene/hydrochlorothiazide combination 75/50 mg daily.  3.  Metoprolol 50 mg p.o. b.i.d.  4.  Zocor 20 mg daily.  5.  Colchicine 0.6 mg daily.   REVIEW OF SYSTEMS:  GENERAL:  Her weight has been stable, although she is  overweight.  No fevers, sweats or chills.  MUSCULOSKELETAL:  She has no new  arthritic complaints, but does have chronic arthritic Complaints in the  shoulder as well as intermittent gouty arthritic complaints.  SKIN:  No easy  bruisability or bleeding.  PULMONARY:  No asthma, no shortness of breath, no  cough.  CARDIOVASCULAR:  She does not have angina, palpitations as per HPI.  GI:  Occasional dyspepsia.  GU:  Occasionally burning and dysuria, but  currently none now.  ENDOCRINE:  There are no symptoms of hyperglycemia.  NEUROLOGIC:  She has never had a TIA or stroke or seizure.  HEENT:  Negative.  NEUROLOGIC/PSYCHIATRIC:  No mania or depression.   PHYSICAL EXAMINATION:  GENERAL:  Obese, Hispanic female in no acute  distress.  VITAL SIGNS:  Blood pressure 152/72, heart rate 88 beats/minute,  respirations 12-16.  She was afebrile.  She was saturating greater than 90%  on room air.  HEENT:  Atraumatic, normocephalic.  Full range of motion of eyes.  Pupils  equal, round, reactive to light and accommodation, no scleral icterus.  Midline nasal septum.  Moist mucous membranes of oropharynx.  NECK:  Supple.  No thyromegaly.  No adenopathy.  No bruits.  Good upstrokes  bilaterally.  JVP less than 7.  CHEST:  Normal female.  BACK:  Negative CVAT.  RESPIRATORY:  Clear to auscultation and percussion.  CARDIOVASCULAR:  Regular rate and rhythm, normal S1 and S2.  No S4 or S3, no  thrills or rubs.  Heart sounds somewhat distant.  ABDOMEN:  Soft, nontender, obese.  No hepatosplenomegaly.  No  masses or  bruits.  EXTREMITIES:  There was no clubbing, cyanosis, or edema.  Peripheral pulses  2+ and equal.  NEUROLOGIC:  She was alert and oriented x4.  Motor and sensory nonfocal.  Cranial nerves II-XII appeared to be intact.  She had good muscle strength  bilaterally in upper and lower extremities.   DIAGNOSTIC STUDIES:  Electrocardiogram on admission demonstrates atrial  fibrillation with ST depressions which largely improved with improved rate  control.  Almost immediately after giving Cardizem, the patient  spontaneously converted to normal sinus rhythm.  In sinus rhythm, the modest  ST depressions and atrial fibrillation were completely resolved.   LABORATORY DATA:  Hematocrit 40.5%, platelet count 376,000.  INR 0.9,  creatinine 0.5.  Electrolytes within normal limits.   IMPRESSION:  1.  New onset atrial fibrillation.  Patient with relatively long lived      episode of paroxysmal atrial fibrillation currently in sinus rhythm.      Based on the symptom frequency, I would not offer her an antiarrhythmic      agent at this time.  I would however pursue excellent blood pressure      control and emphasize the use of at least one rate controlling drug      which she is currently on which is beta blocker 50 mg p.o. b.i.d.  She      has had enough atrial fibrillation this time given her age and her      hypertension diagnosis.  She is a high-risk patient for a stroke.  The      patient has already been started on heparin and I would recommend that      coumadinization begin as soon as we are sure we will no longer need to      do any invasive studies.  2.  Atherosclerotic coronary artery disease.  The patient has no history of      atherosclerotic coronary artery disease.  She does have multiple risk      factors however.  She has a remote history of a graded exercise     tolerance test with Cardiolite injection apparently 5 years ago which      she was told was negative for  ischemia.  Certainly the EKG changes      during atrial fibrillation may be rate related in a female, although      might represent ischemia and as      such, I believe she should have a risk stratifying test.  I recommend a  Cardiolite study to look for ischemia.  If the test is positive for      ischemia, she should have cardiac catheterization.  If the test is      negative, she should have initiation of chronic Coumadin therapy and      risk factor modification.      MEP/MEDQ  D:  01/04/2005  T:  01/05/2005  Job:  [id number]

## 2011-03-07 NOTE — Procedures (Signed)
[facility]  Patient:    [name], [name]                      MRN: [id number] Proc. Date: 04/29/00 Attending:  Barbette Hair. Arlyce Dice, M.D. [facility] CC:         Barton Fanny                           Procedure Report  PROCEDURE PERFORMED:  Colonoscopy.  ENDOSCOPIST:  Barbette Hair. Arlyce Dice, M.D. [facility]  HISTORY:  [name] has had limited episodes of rectal bleeding.  She denies abdominal or rectal pain.  Air contrast barium enema two years ago was normal.  INFORMED CONSENT:  The patient provided consent after risks, benefits and alternatives were explained.  MEDICATIONS USED:  Versed 5 mg, fentanyl 50 mcg IV.  DESCRIPTION OF PROCEDURE:  The patient was placed in the left lateral decubitus position, administered continuous low-flow oxygen and was placed on pulse oximetry.  The Olympus video colonoscope was inserted to 50 cm corresponding to splenic flexure.  FINDINGS:  Small internal hemorrhoids were seen.  Rectum, sigmoid and descending colon were otherwise normal.  IMPRESSION: 1. Internal hemorrhoids. 2. Rectal bleeding probably secondary to #1.  RECOMMENDATIONS:  Anusol suppositories 1 q.h.s. for 5 to 7 nights. DD:  04/29/00 TD:  04/29/00 Job: [id number]

## 2011-03-07 NOTE — H&P (Signed)
NAMELADAVIA, [name] NO.:  [id number]   MEDICAL RECORD NO.:  [id number]          PATIENT TYPE:  EMS   LOCATION:  MAJO                         FACILITY:  [facility]   PHYSICIAN:  [name], M.D. DATE OF BIRTH:  [birth year]   DATE OF ADMISSION:  01/04/2005  DATE OF DISCHARGE:                                HISTORY & PHYSICAL   CHIEF COMPLAINT:  Palpitation.   HISTORY OF PRESENT ILLNESS:  Ms. Guion is a 70 year old female with a past  medical history of hypertension, hyperlipidemia, gout and sleep apnea who  states that she started having palpitations around 10:15 p.m. last night.  Her heart felt as though it was beating too fast during that time.  Said she  had just returned from church.  She ate approximately 3 to 4 quesadillas and  drank a cup of coffee.  Shortly after consuming this meal, her palpitations  began.  She had some slight shortness of breath during this episode but  denies any chest pain throughout the occurrence.  She went on to state that  she had a similar event approximately one month ago but it lasted only a  short period of time.  No nausea, vomiting, fevers or chills.  Her last  bowel movement was this evening.  There was no blood present.  She denies  having any melenic stools.  Stated that she has been coughing for  approximately 1-1/2 weeks and now has some right lower axillary pain.  Her  cough was productive initially.  However, it is now longer productive.   PAST MEDICAL HISTORY:  1.  Hypertension.  2.  Hyperlipidemia.  3.  Gout.  4.  Arthritis of the right shoulder.   PAST SURGICAL HISTORY:  1.  Hysterectomy secondary to bleeding.  2.  Cholecystectomy.  3.  Tonsillectomy.   ALLERGIES:  None.   HOME MEDICATIONS:  1.  Lisinopril 10 mg p.o. daily.  2.  Triamterene/hydrochlorothiazide 75/50 mg daily.  3.  Metoprolol 50 mg p.o. b.i.d.  4.  Zocor 20 mg p.o. daily.  5.  Colchicine 0.6 mg daily.   The patient went on to state  that she has not been compliant with her  medications secondary to the lack of health insurance and, therefore,  inability to pay for her medications.  In particular, she has not taken her  Zocor for some time.  Likewise, her metoprolol ingestion has been erratic.  The patient states that she has not taken her metoprolol every day of this  week and tends to skip days of taking her metoprolol.   FAMILY HISTORY:  Mother has no illnesses.  Patient's father has a  significant cardiovascular history.  He has had triple bypass heart surgery.  He has a pacemaker and he is currently on Coumadin therapy.  Likewise, he  has a history of diabetes mellitus.   SOCIAL HISTORY:  Cigarettes:  Patient states never.  Alcohol:  Never.   REVIEW OF SYSTEMS:  As per HPI.  Otherwise, all other systems are negative.   PHYSICAL EXAMINATION:  GENERAL:  The patient is cooperative.  She is awake  and currently appears to be in no obvious distress.  VITALS:  Temperature upon arrival was 97.5.  Her heart rate at that  particular time was 135, blood pressure 152/72 and her saturations were  noted to be 97%.  HEENT:  Anicteric.  Extraocular movements are intact.  Pupils are equally  reactive to light.  NECK:  Supple.  There is no lymphadenopathy.  The thyroid is not palpable.  She has strong bilateral carotid upstrokes.  No carotid bruits were  auscultated.  CARDIAC:  S1, S2 are present.  However, the heart sounds are somewhat  distant.  RESPIRATORY:  Lungs are clear bilaterally.  There are no crackles or  wheezes.  ABDOMEN:  Soft.  Nontender, nondistended.  Positive bowel sounds.  No  appreciable hepatosplenomegaly.  EXTREMITIES:  There is no leg edema.  NEUROLOGIC:  The patient is alert and oriented x 3.  MUSCULOSKELETAL:  Has 5/5 upper and lower extremity strength.   EKG on admission at 12:05 a.m., shows atrial fibrillation with global ST  segment depression.  However, after Cardizem was given IV x 2, a  repeat EKG  was performed and it showed, at 2:36, the following:  Normal sinus rhythm  and no ST segment depressions.  That is, following the IV push dosages of  Cardizem x 2, the patient's ST segment depressions disappeared.   The patient's labs from January 04, 2005 are the following:  Her white blood  cell count is 12.7, hemoglobin 13.5, hematocrit 40.5 and platelets 376.  PT  is 12.5, INR is 0.9 and creatinine is 0.5.  CK-MB, POC is 1.7.  Troponin I, POC is less than 0.05.  Myoglobin, POC is  79.4.   ASSESSMENT/PLAN:  1.  New-onset atrial fibrillation.  Contributing factors to this may be      multifactorial, one of which could include the caffeine that the patient      consumed.  The other factor could be some rebound secondary to the      discontinuation of the patient's metoprolol.  That is, there could have      been some rebound that may have precipitated this.  However, in light of      the patient having global ST segment depressions, I will have to rule      out a cardiac event as a precipitating factor.  Therefore, I will order      cardiac enzymes, troponin I and CK-MB x 3, 8 hours apart.  I will also      admit the patient to telemetry.  Will continue the Cardizem drip that      the patient is currently on and will consult Cardiology for further      evaluation.  Given the fact that during this episode of      tachycardia/atrial fibrillation the patient had global ST segment      depressions, the patient may need a stress test and/or cardiac      catheterization but, again, I will consult Cardiology for further      evaluation.  In addition, I will go ahead and start the patient on IV      heparin at this particular time, in addition to providing an aspirin,      also.  Likewise, I will continue the oxygen therapy, check a 2D      echocardiogram to evaluate the patient's ejection fraction and overall     cardiovascular architecture.  I will rule out other causes such as  thyroid disease contributing to the patient's new-onset atrial      fibrillation by checking a TSH, T4 and T3.  The patient does not      complain of any shortness of breath which decreases my suspicion of      having underlying pulmonary embolus.  However, I will check a D-dimer.  2.  History of hypertension.  Again, the patient's blood pressure at the      time of admission was slightly above goal.  Will resume the patient's      home regimen of lisinopril and triamterene/hydrochlorothiazide.  In      addition, the patient is currently on a Cardizem drip.  3.  History of hyperlipidemia.  The patient admits that she has not been      compliant with her Zocor.  I will resume      the Zocor that was previously prescribed to the patient by her primary      care physician.  I will also go ahead and check a fasting lipid profile.  4.  Gastrointestinal prophylaxis.  Will provide Protonix 40 mg p.o. daily.      OR/MEDQ  D:  01/04/2005  T:  01/04/2005  Job:  [id number]   cc:   Wilson Singer, M.D.  [address], [address]  [place]  [place] [address]  Fax: [phone]

## 2011-03-07 NOTE — Discharge Summary (Signed)
NAMEJAYLAA, [name]             ACCOUNT NO.:  [id number]   MEDICAL RECORD NO.:  [id number]          PATIENT TYPE:  INP   LOCATION:  4734                         FACILITY:  [facility]   PHYSICIAN:  Danae Chen, M.D.DATE OF BIRTH:  13-May-1941   DATE OF ADMISSION:  01/03/2005  DATE OF DISCHARGE:  01/07/2005                                 DISCHARGE SUMMARY   DISCHARGE DIAGNOSES:  1.  Paroxysm atrial fibrillation.  2.  Morbid obesity.  3.  Hypertension.  4.  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  5.  Hypokalemia.   DISCHARGE MEDICATIONS:  1.  Coumadin 5 mg p.o. daily.  2.  Enteric-coated aspirin 325 mg daily.  3.  Zocor 20 mg p.o. daily.  4.  Colchicine 0.6 mg p.o. daily.  5.  Metoprolol 50 mg p.o. b.i.d.  6.  Maxzide 75/50 mg p.o. daily.   FOLLOW UP:  Follow-up appointment was with Dr. Wilson Singer on January 10, 2005 at 2:15 p.m. Phone number is [phone]. Follow up with Dr. Janeece Riggers.  Peele on January 15, 2005 at 10 a.m. Phone number is [phone].   CONSULTATIONS:  [facility] for cardiology.   PROCEDURES:  1.  Echocardiogram done on January 06, 2005 showing left ventricular size.      Ejection fraction estimated between 55-65%. No ventricular regional wall      abnormalities.  2.  The patient also had a nuclear medicine myocardial perfusion test done      on January 06, 2005 showing a left ventricular ejection fraction of 72%,      normal left ventricular wall motion.   CONDITION ON DISCHARGE:  Improved.   HOSPITAL COURSE:  Please refer to the history and physical for details of  admission. The patient is a 70 year old female with the history as described  above. She was admitted for complaints of palpitations. She was found to  have intermittent atrial fibrillation and underwent a workup which included  serial cardiac enzymes which were negative. The patient was additionally  started on IV Cardizem and switched to oral medication for blood pressure  control and rate and rhythm control. She spontaneously converted and by  discharge was in normal sinus rhythm. However, given her prior risk factors  of hypertension and obesity, probably obstructive sleep apnea, she was  started on Coumadin therapy. A Cardiolite was normal and so was her  echocardiogram. She was discharged home with follow up with cardiology and  primary care physician to have her PT and INR checked. At the time of  discharge, she was afebrile. Vital signs were stable. Blood pressure was  160/108 on recheck. Pulse was 126 and on recheck was 84.  Heart rate was regular. Lungs were clear. Abdomen was obese. She had no  peripheral edema. White count 12.8, hemoglobin 13.6.   CONDITION ON DISCHARGE:  Improved.       RLK/MEDQ  D:  03/22/2005  T:  03/24/2005  Job:  [id number]

## 2011-03-07 NOTE — Discharge Summary (Signed)
[name], GAD NO.:  [id number]   MEDICAL RECORD NO.:  [id number]          PATIENT TYPE:  INP   LOCATION:  2012                         FACILITY:  [facility]   PHYSICIAN:  [name], M.D. DATE OF BIRTH:  1941/01/01   DATE OF ADMISSION:  08/12/2006  DATE OF DISCHARGE:  08/16/2006                               DISCHARGE SUMMARY   FINAL DIAGNOSES:  1. Palpitations.  2. Mildly elevated troponin.  3. Diarrhea.  4. Uncontrolled hypertension.   PROCEDURES:  1. Portable chest x-ray, completed October24,2007.  2. Nuclear medicine myocardial imaging SPECT stress test.   CONSULTATIONS:  Cardiology with [facility] and vascular, Dr.  Domingo Sep.   HISTORY OF PRESENT ILLNESS:  [name] is a 70 year old female who  indicated that over the 24 hours leading up to this admission, she had  developed cardiac palpitations.  She also complained of having diarrhea  for 5 days leading up to this admission.  She denied having any chest  pain.   For past medical history, please see that dictated by Dr. Della Goo, on October24,2007.   HOSPITAL COURSE:  1. Cardiac palpitations.  The etiology of this was questionable.  The      patient did have cardiac enzymes ordered and her troponin I were      found to be 0.09, 0.13, and 0.20.  Her CK MBs were 4.9, 4.9, and      4.6.  She had a portable chest x-ray completed on October 24th      which revealed cardiomegaly with vascular congestion.  Cardiology      was consulted. They indicated when they saw the patient on October      25th, that the patient showed no arrhythmias on telemetry and      denied having any chest pain or shortness of breath.  A stress      Myoview test was completed on October 27th.  It revealed negative      inducible ischemia, normal wall motion, and the EF was estimated to      be 69%.  The patient remained without any chest pain or shortness      of breath throughout the course of her  hospitalization.  Her      palpitations resolved over the course of hospitalization.  2. Diarrhea.  This improved over the course of the patient's      hospitalization.  Stool was sent for ova and parasites and was      found to be negative.  Her stool was negative for C diff.  She was      started on Lomotil, and by the day of discharge her diarrhea      improved.  3. History of hypertension.  Attempts were made to control this over      the course of the hospitalization.  The patient will have to follow      up with a primary care physician for optimal control.  4. Hypokalemia.  The patient's potassium during the earlier portion of      her hospitalization was 2.8, whether or not this  contributed to the      palpitations was questionable.  She received supplementation.   On the day of discharge, the patient denied having any chest pain or  shortness of breath.  She indicated that her diarrhea was much better  and she requested to be discharged from the hospital.  Her temperature  was 97, heart rate 58, respirations 18, blood pressure 159/81, O2 sat  95% on room air.   Her labs are sodium 141, potassium 3.5, chloride 107, CO2 25, BUN 6,  creatinine 0.6, glucose 105.  Her PT was 21.7, INR 1.8, calcium 8.2.   The patient was discharged home on:   1. Lisinopril 40 mg p.o. daily.  2. Metoprolol 50 mg p.o. b.i.d.  3. Coumadin 7.5 mg p.o. daily.  4. Norvasc 10 mg p.o. daily.  5. Lipitor 40 mg p.o. daily.  6. Lomotil 2 mg, one tablet p.o. q.8 h.   The patient was instructed to follow up with Dr. Lilly Cove within 2-  4 weeks.      [name], M.D.  Electronically Signed     OR/MEDQ  D:  11/21/2006  T:  11/21/2006  Job:  [id number]

## 2011-03-07 NOTE — Discharge Summary (Signed)
[name], [name]             ACCOUNT NO.:  [id number]   MEDICAL RECORD NO.:  [id number]          PATIENT TYPE:  INP   LOCATION:  4734                         FACILITY:  [facility]   PHYSICIAN:  [name], M.D.DATE OF BIRTH:  [birth year]   DATE OF ADMISSION:  01/03/2005  DATE OF DISCHARGE:  01/07/2005                                 DISCHARGE SUMMARY   PRIMARY CARE PHYSICIAN:  [name], M.D.   CONSULTATIONSLoraine Leriche E. Severiano [name], M.D., [facility],  cardiology.   DISCHARGE DIAGNOSES:  1.  New onset, newly diagnosed atrial fibrillation with rapid ventricular      response, spontaneous conversion after receiving IV Cardizem during      hospitalization.  2.  Hypertension.  3.  Dyslipidemia.  4.  Gouty arthritis.  5.  Obesity.   DISCHARGE MEDICATIONS:  1.  Coumadin 5 mg p.o. daily.  2.  Enteric-coated aspirin 325 mg p.o. daily.  3.  Zocor 20 mg p.o. daily.  4.  Colchicine 0.6 mg p.o. daily.  5.  Metoprolol 50 mg p.o. b.i.d.  6.  Maxzide 75/50 p.o. daily.   PROCEDURE:  1.  Cardiolite performed on January 06, 2005, showing no evidence of ischemia.  2.  Two-dimensional echocardiogram on January 06, 2005, showing preserved      normal left ventricular systolic function, no evidence of significant      valvular abnormalities.   HOSPITAL COURSE:  Please refer to the H&P for details of admission.  The  patient is a 70 year old Hispanic female with cardiac risk factors including  dyslipidemia, hypertension, and obesity, who presented to the emergency room  with complaints of palpitations and some dizziness.  She was found to be in  atrial fibrillation by EKG with rapid ventricular response.  She was  initially placed on IV Cardizem drip and converted to normal sinus rhythm.  She did have good blood pressure control during her hospital stay.  Cardiology was consulted and further evaluation and recommendation was made  for a Cardiolite.  The  patient reported that she had a stress Cardiolite  approximately 5 years ago that did not show any ischemia, however, given the  remoteness of the procedure and her current symptoms with atrial  fibrillation, risk stratification with Cardiolite was recommended and  proceeded to undergo that procedure on January 06, 2005.  As noted above,  there was no significant ischemia as evidenced by her Cardiolite.  However,  cardiology did recommend that given her significant risk factors for stroke,  that she should be anticoagulated with Coumadin therapy and administer  aspirin.  This was initiated at the time of discharge on January 07, 2005,  with 5 mg of Coumadin.  The patient needs to have her INR checked within the  next 2 to 3 days and an appointment has been made for her to see Dr.  Karilyn [name], her primary physician, by Friday at 2:15 p.m. on January 10, 2005.   CONDITION ON DISCHARGE:  The patient is afebrile, vital signs are stable.  Blood pressure is 108/61, heart is 65 in normal sinus rhythm.  O2  saturations are 96% on room air.  Lungs were clear.  Heart rate is regular,  abdomen is obese, soft, and nontender.  No rebound or guarding.  She has no  peripheral edema.   DISCHARGE LABORATORY DATA:  Serial cardiac enzyme markers were negative.  Hemoglobin was 14, white count 13.4, platelets 371.  BUN 9, creatinine 0.7,  potassium 4.5.  Hemoglobin A1C 6.4.   The patient was initially started on Avelox for presumed bronchitis, but the  patient has remained afebrile throughout her stay, no cough, no other  significant complaints of upper respiratory congestion.  Therefore, we will  discontinue her antibiotics at this time.  Her white count is slightly  elevated, but this can be followed up with her primary care physician as  well.  She did have hypokalemia most likely secondary to diuresis and her  potassium was replaced and at the time of discharge was within normal range.  As noted, her follow-up  appointment was with Dr. Karilyn [name] on January 10, 2005,  at 2:15 p.m. to have her PT/INR checked as well at that time.      RLK/MEDQ  D:  01/07/2005  T:  01/07/2005  Job:  [id number]

## 2011-03-07 NOTE — H&P (Signed)
NAMEBINDI, KLOMP NO.:  [id number]   MEDICAL RECORD NO.:  [id number]          PATIENT TYPE:  INP   LOCATION:  2012                         FACILITY:  [facility]   PHYSICIAN:  Della Goo, M.D. DATE OF BIRTH:  1941/04/29   DATE OF ADMISSION:  08/12/2006  DATE OF DISCHARGE:                                HISTORY & PHYSICAL   CHIEF COMPLAINT:  Heart palpitations and severe diarrhea.   HISTORY OF PRESENT ILLNESS:  This is a 70 year old female who was brought to  the emergency room via ambulance secondary to onset of chest palpitations  over the past 24 hours, along with severe diarrhea for 5 days.  Patient  denies having chest pain, does report having nausea 5 days ago, no vomiting  but has had multiple, uncountable episodes of diarrhea.  Patient denies  seeing any melena or hematochezia.  She does report having fevers 4-5 days  ago, no chills.  She does not believe that she has food poisoning and denies  that anyone else at home has similar illness.   PAST MEDICAL HISTORY:  1. Hypertension.  2. Paroxysmal atrial fibrillation.  3. Hyperlipidemia.  4. Obstructive sleep apnea.   PAST SURGICAL HISTORY:  1. Status post appendectomy.  2. Status post total abdominal hysterectomy.  3. Status post cholecystectomy.   MEDICATIONS:  Include:  1. Coumadin 7.5 mg 1 p.o. q. day.  2. Crestor 20 mg 1 p.o. q. day.  3. Furosemide 40 mg 1 p.o. q. day.  4. Lisinopril 20 mg 2 p.o. q. day.  5. Metoprolol 50 mg 2 p.o. q. day.  6. Potassium chloride 20 mEq 1 p.o. t.i.d.  7. Patient also has CPAP nightly for obstructive sleep apnea.  CPAP      settings are pressure of 10.   SOCIAL HISTORY:  Patient is married, has 3 adult children, 11 grandchildren,  2 great grandchildren.  Non smoker, non drinker, and no history of drug  usage.   FAMILY HISTORY:  Positive for diabetes, both sides of her family.  Mother  died of a head injury after a fall in the shower.  Father has  coronary  artery disease and is status post coronary artery bypass grafting x3.   REVIEW OF SYSTEMS:  Mentioned above.   PHYSICAL EXAMINATION:  70 year old obese female in no discomfort or acute  distress currently.  Vital signs:  Temperature 97.5, blood pressure 128/74,  heart rate 78, respirations 22, O2 saturation 95-100%.  HEENT:  Normocephalic, atraumatic.  There is no scleral icterus.  Pupils are  equally round, reactive to light.  Extraocular muscles are intact.  Funduscopic exam benign.  Oropharynx clear.  Mucosa dry, no exudates or  erythema.  NECK:  Supple, full range of motion, no thyromegaly, adenopathy, or jugular  venous distension.  CARDIOVASCULAR:  Regular rate, rhythm.  LUNGS:  Clear to auscultation bilaterally.  ABDOMEN:  Obese, non tender, non distended.  EXTREMITIES:  Without edema.  Positive varicosities bilateral lower  extremities.  No cyanosis, clubbing, or edema.  NEUROLOGIC EXAMINATION:  Patient is alert and oriented x3.  Speech is clear.  Cranial nerves  are intact.  There are no sensory or motor function deficits.   LABORATORY DATA:  Reveal a white blood cell count of 11.1, hemoglobin 14.0,  hematocrit 41.6, platelets 328, neutrophils 63%, lymphocytes 19%, monocytes  17% which are elevated.  Sodium level 140, potassium 2.7, chloride 107, bi  carb 25.5, BUN 8, creatinine 0.6, AST 23, ALT 18, total bilirubin 0.9,  alkaline phosphatase 93, albumin 3.5, total protein 7.0, calcium 8.9.  Lipase level 23.  Cardiac enzymes:  Myoglobin 181, CK MB 4.9, troponin 0.09.  Repeat cardiac enzymes reveal:  A myoglobin of 135, a CK MB of 4.9, and  troponin of 0.13.  Chest x-ray reveals:  Mild cardiomegaly and mild vascular  congestion.   ASSESSMENT:  70 year old female with severe diarrhea and hypokalemia  resulting in chest palpitations.   ADMISSION DIAGNOSES:  Include:  1. Acute diarrheal syndrome.  2. Hypokalemia with potassium of 2.7.  3. Weakness.  4. Mild  pulmonary edema.  5. History of hypertension.  6. History of hyperlipidemia.  7. Morbid obesity.   PLAN:  Patient will be admitted and placed on IV fluids for hydration.  Patient has been administered IV Cipro in the emergency department for an  infectious etiology.  Stool cultures have been ordered.  A Culture and  Sensitivity, a C-diff, along with an ova and parasites study have been  ordered.  Patient will continue on her regular medications for now.  Her PT  and INR will be monitored daily.  Patient will be placed on telemetry and  cardiac enzymes will be ordered.  The mild elevation in troponin since the  other cardiac enzymes are negative in the profile, the troponin's may be  elevated secondary to the palpitations from her hypokalemia.  She will be  monitored further changes on telemetry.      Della Goo, M.D.  Electronically Signed     HJ/MEDQ  D:  08/13/2006  T:  08/13/2006  Job:  [id number]

## 2011-03-14 ENCOUNTER — Ambulatory Visit (HOSPITAL_COMMUNITY)
Admission: RE | Admit: 2011-03-14 | Discharge: 2011-03-14 | Disposition: A | Discharge status: Home / Self Care | Payer: Medicare Other | Source: Ambulatory Visit | Attending: Family Medicine | Admitting: Family Medicine

## 2011-03-14 DIAGNOSIS — Z1231 Encounter for screening mammogram for malignant neoplasm of breast: Secondary | ICD-10-CM | POA: Insufficient documentation

## 2011-04-18 ENCOUNTER — Other Ambulatory Visit (HOSPITAL_COMMUNITY): Payer: Self-pay | Admitting: Family Medicine

## 2011-04-18 DIAGNOSIS — K760 Fatty (change of) liver, not elsewhere classified: Secondary | ICD-10-CM

## 2011-04-18 DIAGNOSIS — D3500 Benign neoplasm of unspecified adrenal gland: Secondary | ICD-10-CM

## 2011-05-05 ENCOUNTER — Ambulatory Visit (INDEPENDENT_AMBULATORY_CARE_PROVIDER_SITE_OTHER): Payer: Medicare Other | Admitting: Cardiology

## 2011-05-05 ENCOUNTER — Encounter: Payer: Self-pay | Admitting: Cardiology

## 2011-05-05 VITALS — BP 140/68 | HR 100 | Ht 61.0 in | Wt 262.0 lb

## 2011-05-05 DIAGNOSIS — I10 Essential (primary) hypertension: Secondary | ICD-10-CM

## 2011-05-05 DIAGNOSIS — I4891 Unspecified atrial fibrillation: Secondary | ICD-10-CM

## 2011-05-05 DIAGNOSIS — E785 Hyperlipidemia, unspecified: Secondary | ICD-10-CM

## 2011-05-05 NOTE — Progress Notes (Signed)
HPI: [name] is seen as an add on for atrial fibrillation and atrial flutter. She is on Coumadin given age gender, DM and hypertension. In 2007 she undergone echo and Myoview scanning both of which demonstrated normal left ventricular function and the latter no evidence of ischemia. The patient typically denies dyspnea on exertion, orthopnea, PND, chest pain, palpitations or syncope. This morning she developed a weak feeling and checked her pulse and noted that she was in atrial fibrillation. This was new and began at 7:30 AM today. There is no chest pain or shortness of breath. She was seen by her primary care physician and sent for evaluation here.  Current Outpatient Prescriptions  Medication Sig Dispense Refill  . colchicine (COLCRYS) 0.6 MG tablet Take 0.6 mg by mouth daily.        Marland Kitchen diltiazem (TIAZAC) 240 MG 24 hr capsule Take 240 mg by mouth daily.        Marland Kitchen glimepiride (AMARYL) 2 MG tablet Take 2 mg by mouth daily before breakfast.        . lisinopril (PRINIVIL,ZESTRIL) 40 MG tablet Take 40 mg by mouth daily.        . metoprolol (LOPRESSOR) 50 MG tablet Take 50 mg by mouth 2 (two) times daily.        . potassium chloride (KLOR-CON) 20 MEQ packet Take 20 mEq by mouth daily.        Marland Kitchen triamterene-hydrochlorothiazide (MAXZIDE-25) 37.5-25 MG per tablet Take 1 tablet by mouth daily.        Marland Kitchen warfarin (COUMADIN) 5 MG tablet Take 5 mg by mouth daily.           Past Medical History  Diagnosis Date  . Atrial fibrillation   . Hypertension   . Diabetes mellitus   . Hyperlipidemia   . OSA (obstructive sleep apnea)   . Gout   . Atrial flutter   . Diastolic CHF, chronic     No past surgical history on file.  History   Social History  . Marital Status: Married    Spouse Name: N/A    Number of Children: N/A  . Years of Education: N/A   Occupational History  . Not on file.   Social History Main Topics  . Smoking status: Never Smoker   . Smokeless tobacco: Not on file  . Alcohol  Use: Not on file  . Drug Use: Not on file  . Sexually Active: Not on file   Other Topics Concern  . Not on file   Social History Narrative  . No narrative on file    ROS: no fevers or chills, productive cough, hemoptysis, dysphasia, odynophagia, melena, hematochezia, dysuria, hematuria, rash, seizure activity, orthopnea, PND, pedal edema, claudication. Remaining systems are negative.  Physical Exam: Well-developed obese in no acute distress.  Skin is warm and dry.  HEENT is normal.  Neck is supple. No thyromegaly.  Chest is clear to auscultation with normal expansion.  Cardiovascular exam is irregular Abdominal exam nontender or distended. No masses palpated. Extremities show trace edema. neuro grossly intact  ECG Atrial fibrillation at a rate of 113. Axis normal. Nonspecific ST changes.

## 2011-05-05 NOTE — Assessment & Plan Note (Signed)
Management per primary care. 

## 2011-05-05 NOTE — Assessment & Plan Note (Signed)
Patient has developed recurrent atrial fibrillation. It began at 7:30 AM today. Plan to check INR today. Note she states it was above 2 last week at [facility] but those records are not available. She will continue her present medications but take an additional 50 mg of metoprolol later today. Hopefully she will convert on her own. She will be seen in the office tomorrow and if her atrial fibrillation persists we will arrange cardioversion. She may also require an antiarrhythmic such as flecainide if her atrial fibrillation becomes more frequent. Note LV function normal and no history of CAD.

## 2011-05-05 NOTE — Patient Instructions (Signed)
Your physician recommends that you schedule a follow-up appointment in: DR [facility] 05-06-11 @ 3:15PM  TAKE EXTRA 50MG  METOPROLOL WHEN YOU GET HOME

## 2011-05-05 NOTE — Assessment & Plan Note (Signed)
Blood pressure controlled. Continue present medications. 

## 2011-05-06 ENCOUNTER — Ambulatory Visit: Payer: Medicare Other | Admitting: Cardiology

## 2011-05-06 ENCOUNTER — Ambulatory Visit (INDEPENDENT_AMBULATORY_CARE_PROVIDER_SITE_OTHER): Payer: Medicare Other | Admitting: Internal Medicine

## 2011-05-06 ENCOUNTER — Encounter: Payer: Self-pay | Admitting: Internal Medicine

## 2011-05-06 DIAGNOSIS — G4730 Sleep apnea, unspecified: Secondary | ICD-10-CM

## 2011-05-06 DIAGNOSIS — I10 Essential (primary) hypertension: Secondary | ICD-10-CM

## 2011-05-06 DIAGNOSIS — Z136 Encounter for screening for cardiovascular disorders: Secondary | ICD-10-CM

## 2011-05-06 DIAGNOSIS — I4891 Unspecified atrial fibrillation: Secondary | ICD-10-CM

## 2011-05-06 NOTE — Patient Instructions (Signed)
We will set you up to have a home sleep study done.  Your physician recommends that you return for lab work today: bmp/magnesium (401.1;427.31).  Your physician wants you to follow-up in: 6 months. You will receive a reminder letter in the mail two months in advance. If you don't receive a letter, please call our office to schedule the follow-up appointment.

## 2011-05-06 NOTE — Assessment & Plan Note (Signed)
We'll plan to undertake a sleep study with referral to pulmonary. She has significant symptoms. This will also help the patient reduce her burden of atrial fibrillation and improve her energy so she can start to exercise and maybe have an impact on her diabetes

## 2011-05-06 NOTE — Assessment & Plan Note (Addendum)
Currently in sinus rhythm; we'll continue to use p.r.n. Flecainide  She is appropriately on anticoagulation

## 2011-05-06 NOTE — Assessment & Plan Note (Signed)
Reasonably controlled on a complex medical regime. Given her Diuretics, we will check her metabolic profile

## 2011-05-06 NOTE — Progress Notes (Signed)
  HPI  [name] is a 70 y.o. female seen followup paroxysmal atrial fibrillation in the context of hypertension diabetes and symptoms consistent with sleep apnea.  She was seen in the office again yesterday because of a paroxysmal atrial fibrillation and hypertension noted at her referring physician's office that was normal by the time she got here.  She has multiple questions about her medications.  She is significant ongoing fatigue and daytime somnolence as well as sleep-disordered breathing   Past Medical History  Diagnosis Date  . Atrial fibrillation   . Hypertension   . Diabetes mellitus   . Hyperlipidemia   . OSA (obstructive sleep apnea)   . Gout   . Atrial flutter   . Diastolic CHF, chronic     No past surgical history on file.  Current Outpatient Prescriptions  Medication Sig Dispense Refill  . colchicine (COLCRYS) 0.6 MG tablet Take 0.6 mg by mouth daily.        Marland Kitchen diltiazem (TIAZAC) 240 MG 24 hr capsule Take 240 mg by mouth daily.        Marland Kitchen glimepiride (AMARYL) 2 MG tablet Take 2 mg by mouth daily before breakfast.        . lisinopril (PRINIVIL,ZESTRIL) 40 MG tablet Take 40 mg by mouth daily.        . metoprolol (LOPRESSOR) 100 MG tablet Take 1 tablet (100 mg total) by mouth 2 (two) times daily.      . potassium chloride (KLOR-CON) 20 MEQ packet Take 20 mEq by mouth daily.        Marland Kitchen triamterene-hydrochlorothiazide (MAXZIDE-25) 37.5-25 MG per tablet Take 1 tablet by mouth daily.        Marland Kitchen warfarin (COUMADIN) 5 MG tablet Take 5 mg by mouth daily.          No Known Allergies  Review of Systems negative except from HPI and PMH  Physical Exam Well developed and Morbidly obese woman in no acute distress HENT normal E scleral and icterus clear Neck Supple JVP Not detectable Clear to ausculation Regular rate and rhythm, no murmurs gallops or rub Obese but soft No clubbing cyanosis and edema Alert and oriented, grossly normal motor and sensory function Skin  Warm and Dry  ECG Sinus rhythm at 59  Intervals 18/0.08/0.47 Otherwise normal Assessment and  Plan

## 2011-05-07 LAB — BASIC METABOLIC PANEL
CO2: 32 mEq/L (ref 19–32)
Calcium: 8.9 mg/dL (ref 8.4–10.5)
Chloride: 104 mEq/L (ref 96–112)
Glucose, Bld: 126 mg/dL — ABNORMAL HIGH (ref 70–99)
Sodium: 143 mEq/L (ref 135–145)

## 2011-06-03 ENCOUNTER — Ambulatory Visit (HOSPITAL_BASED_OUTPATIENT_CLINIC_OR_DEPARTMENT_OTHER): Payer: Medicare Other

## 2011-06-03 ENCOUNTER — Telehealth: Payer: Self-pay | Admitting: Cardiology

## 2011-06-03 DIAGNOSIS — G4730 Sleep apnea, unspecified: Secondary | ICD-10-CM

## 2011-06-03 NOTE — Telephone Encounter (Signed)
Order for sleep study put into EPIC.

## 2011-06-03 NOTE — Telephone Encounter (Signed)
Opened in error

## 2011-06-05 ENCOUNTER — Ambulatory Visit (HOSPITAL_BASED_OUTPATIENT_CLINIC_OR_DEPARTMENT_OTHER): Payer: Medicare Other | Attending: Internal Medicine

## 2011-06-05 ENCOUNTER — Ambulatory Visit (HOSPITAL_COMMUNITY): Admission: RE | Admit: 2011-06-05 | Payer: Medicare Other | Source: Ambulatory Visit

## 2011-06-05 DIAGNOSIS — G4733 Obstructive sleep apnea (adult) (pediatric): Secondary | ICD-10-CM | POA: Insufficient documentation

## 2011-06-05 DIAGNOSIS — G4730 Sleep apnea, unspecified: Secondary | ICD-10-CM

## 2011-06-12 DIAGNOSIS — G4733 Obstructive sleep apnea (adult) (pediatric): Secondary | ICD-10-CM

## 2011-06-12 NOTE — Procedures (Signed)
NAMELOISANN, ROACH NO.:  [id number]  MEDICAL RECORD NO.:  [id number]          PATIENT TYPE:  OUT  LOCATION:  SLEEP CENTER                 FACILITY:  [facility]  PHYSICIAN:  Barbaraann Share, MD,FCCPDATE OF BIRTH:  1941-01-[birth year]  DATE OF STUDY:  06/05/2011                           NOCTURNAL POLYSOMNOGRAM  REFERRING PHYSICIAN:  Duke Salvia, MD, [facility]  INDICATION FOR STUDY:  Hypersomnia with sleep apnea.  EPWORTH SCORE:  2..  SLEEP ARCHITECTURE:  The patient had a total sleep time of 362 minutes with no slow wave sleep and decreased quantity of REM.  Sleep onset latency was normal at 16 minutes, and REM onset was delayed at 143 minutes.  Sleep efficiency was moderately reduced in the diagnostic portion of the study at 74% and increased to 94% during the titration portion of the study.  RESPIRATORY DATA:  The patient underwent a split night protocol where she was found to have 49 obstructive events in the first 146 minutes of sleep.  This gave her an apnea/hypopnea index of 20 events per hour. The events occurred primarily in the supine position and there was moderate to loud snoring noted throughout.  By protocol, the patient was then placed on a ResMed small Quattro FX CPAP mask and CPAP titration was initiated.  She had good control of both her obstructive events and snoring at 10 cm of pressure.  OXYGEN DATA:  There was O2 desaturation transiently as low as 87%.  CARDIAC DATA:  Occasional PAC noted but no clinically significant arrhythmias were seen.  MOVEMENT/PARASOMNIA:  The patient had no significant leg jerks or other abnormal behaviors noted.  IMPRESSION/RECOMMENDATIONS: 1. Moderate obstructive sleep apnea/hypopnea syndrome by split night     study, with an AHI of 20 events per hour and O2 desaturation as low     as 87%.  The patient was then placed on a small Quattro FX full-     face mask, and titrated to an optimal pressure of 10 cm of water.  She should also be encouraged to work aggressively on     weight loss. 2. Occasional premature atrial contraction noted, but no clinically     significant arrhythmias were seen.     Barbaraann Share, MD,FCCP Diplomate, American Board of Sleep Medicine Electronically Signed    [facility]/MEDQ  D:  06/12/2011 14:15:18  T:  06/12/2011 21:09:13  Job:  [id number]

## 2011-06-18 ENCOUNTER — Telehealth: Payer: Self-pay | Admitting: Internal Medicine

## 2011-06-18 NOTE — Telephone Encounter (Signed)
Pt had a sleep study done last week pt wants to know the results. Pt wants to talk to a nurse.

## 2011-06-18 NOTE — Telephone Encounter (Signed)
Patient called regarding Sleep study done at Sleep study center on 06/05/11. General Impression of results given. Patient was made aware that we will call her back after Dr. Graciela Husbands review test, with  results and recommendations.

## 2011-06-25 ENCOUNTER — Telehealth: Payer: Self-pay | Admitting: *Deleted

## 2011-06-25 NOTE — Telephone Encounter (Signed)
[id number] 

## 2011-06-25 NOTE — Telephone Encounter (Signed)
Message copied by Salli Quarry on Wed Jun 25, 2011  5:33 PM ------      Message from: [place], Maree Krabbe      Created: Thu Jun 19, 2011  7:21 PM       She needs to be set up, but let Klein's nurse know for courtesy sake.  Thanks.       ----- Message -----         From: Tylene Fantasia. Dalila Arca, LPN         Sent: 06/19/2011   4:46 PM           To: Barbaraann Share, MD            No. She hasn't been set up yet?  Would you like me to go ahead and call her or is Dr. Odessa Fleming nurse, Herbert Seta?       ----- Message -----         From: Barbaraann Share, MD         Sent: 06/16/2011   6:16 PM           To: Tylene Fantasia. [place], [employer], did this pt get an appt with me?      ----- Message -----         From: Duke Salvia, MD         Sent: 06/15/2011  10:08 PM           To: Barbaraann Share, MD, Jefferey Pica, RN            H could you please make an appt for her to see Dr Shelle Iron      Thanks steve      ----- Message -----         From: Barbaraann Share, MD         Sent: 06/13/2011   8:34 AM           To: Duke Salvia, MD            Brett Canales, this pt had moderate osa by her recent sleep study, with AHI 20/hr and cpap titrated to 10cm.  Would be happy to see her.

## 2011-06-26 NOTE — Telephone Encounter (Signed)
[facility]

## 2011-06-30 NOTE — Telephone Encounter (Signed)
Pt called back and was scheduled for sleep consult with [facility] for 07/11/11 at 10:30am

## 2011-07-02 ENCOUNTER — Ambulatory Visit (HOSPITAL_COMMUNITY)
Admission: RE | Admit: 2011-07-02 | Discharge: 2011-07-02 | Disposition: A | Discharge status: Home / Self Care | Payer: Medicare Other | Source: Ambulatory Visit | Attending: Family Medicine | Admitting: Family Medicine

## 2011-07-02 DIAGNOSIS — D3500 Benign neoplasm of unspecified adrenal gland: Secondary | ICD-10-CM | POA: Insufficient documentation

## 2011-07-02 DIAGNOSIS — K7689 Other specified diseases of liver: Secondary | ICD-10-CM | POA: Insufficient documentation

## 2011-07-02 DIAGNOSIS — K760 Fatty (change of) liver, not elsewhere classified: Secondary | ICD-10-CM

## 2011-07-02 LAB — CREATININE, SERUM
Creatinine, Ser: 0.58 mg/dL (ref 0.50–1.10)
GFR calc Af Amer: >60 mL/min (ref >60)
GFR calc non Af Amer: >60 mL/min (ref >60)

## 2011-07-02 LAB — BUN: BUN: 14 mg/dL (ref 6–23)

## 2011-07-02 MED ORDER — GADOBENATE DIMEGLUMINE 529 MG/ML IV SOLN
20.0000 mL | Freq: Once | INTRAVENOUS | Status: AC
  Administered 2011-07-02: 20 mL via INTRAVENOUS

## 2011-07-04 NOTE — Telephone Encounter (Signed)
I did not speak with the patient. She may have called back, but she does have an appointment with Dr. Shelle Iron on 07/11/11.

## 2011-07-10 ENCOUNTER — Telehealth: Payer: Self-pay | Admitting: Internal Medicine

## 2011-07-11 ENCOUNTER — Ambulatory Visit (INDEPENDENT_AMBULATORY_CARE_PROVIDER_SITE_OTHER): Payer: Medicare Other | Admitting: Pulmonary Disease

## 2011-07-11 ENCOUNTER — Encounter: Payer: Self-pay | Admitting: Pulmonary Disease

## 2011-07-11 VITALS — BP 124/70 | HR 65 | Temp 97.8°F | Ht 61.0 in | Wt 266.6 lb

## 2011-07-11 DIAGNOSIS — G4733 Obstructive sleep apnea (adult) (pediatric): Secondary | ICD-10-CM

## 2011-07-11 LAB — I-STAT 8, (EC8 V) (CONVERTED LAB)
BUN: 9
Bicarbonate: 26.5 — ABNORMAL HIGH
HCT: 50 — ABNORMAL HIGH
Operator id: 151321
pCO2, Ven: 33.9 — ABNORMAL LOW

## 2011-07-11 LAB — TSH: TSH: 2.374

## 2011-07-11 LAB — PROTIME-INR
INR: 2.1 — ABNORMAL HIGH
Prothrombin Time: 24.2 — ABNORMAL HIGH

## 2011-07-11 LAB — POCT CARDIAC MARKERS
CKMB, poc: 3.2
Myoglobin, poc: 102
Myoglobin, poc: 68.1
Operator id: 151321

## 2011-07-11 LAB — CK TOTAL AND CKMB (NOT AT ARMC): Total CK: 143

## 2011-07-11 NOTE — Progress Notes (Signed)
  Subjective:    Patient ID: [name], female    DOB: [birth year], 70 y.o.   MRN: [id number]  HPI The patient comes in today to reestablish for management of her obstructive sleep apnea.  She was noted to have mild OSA in 2010, and has been on CPAP since that time.  She was last seen in May 2011 where she was having mask fit issues, but these are much improved.  She's been wearing CPAP compliantly, and feels that she sleeps well with the device.  She is rested in the mornings upon arising, and denies any significant daytime sleepiness.  She is having recent followup study which showed an AHI of 20 events per hour, and her optimal CPAP was titrated to 10 cm of water.  She used a fullface mask during the study that she felt was the best she has ever tried.   Review of Systems  Constitutional: Negative for fever and unexpected weight change.  HENT: Negative for ear pain, nosebleeds, congestion, sore throat, rhinorrhea, sneezing, trouble swallowing, dental problem, postnasal drip and sinus pressure.   Eyes: Negative for redness and itching.  Respiratory: Negative for cough, chest tightness, shortness of breath and wheezing.   Cardiovascular: Positive for palpitations and leg swelling.  Gastrointestinal: Positive for abdominal pain. Negative for nausea and vomiting.  Genitourinary: Negative for dysuria.  Musculoskeletal: Negative for joint swelling.  Skin: Positive for rash.  Neurological: Positive for headaches.  Hematological: Does not bruise/bleed easily.  Psychiatric/Behavioral: Negative for dysphoric mood. The patient is not nervous/anxious.        Objective:   Physical Exam Obese female in no acute distress No skin breakdown from pressure necrosis from the CPAP mask Oropharynx clear Chest is clear to auscultation Lower extremities with 1+ edema, no cyanosis noted Alert, does not appear sleepy, moves all 4 extremities.       Assessment & Plan:

## 2011-07-11 NOTE — Patient Instructions (Signed)
Will have advanced get you the same mask used at your recent sleep study Will have your pressure adjusted to 10cm. Work on weight loss followup with me in one year.

## 2011-07-11 NOTE — Assessment & Plan Note (Signed)
The patient has known moderate obstructive sleep apnea, and has been wearing CPAP compliantly.  She has had mask fit issues in the past, but was very pleased with the most recent mask used during her followup sleep study.  Her optimal pressure has been found to be 10 cm, and we'll send an order to her DME to fit her for the mask used during that study.  I have asked her to work aggressively on weight loss, and she is to follow up with me in one year.

## 2011-07-17 LAB — CBC
HCT: 39
MCV: 80
RBC: 4.87
WBC: 14.4 — ABNORMAL HIGH

## 2011-07-17 LAB — POCT CARDIAC MARKERS
CKMB, poc: 4.2
Myoglobin, poc: 103
Myoglobin, poc: 87.4

## 2011-07-17 LAB — DIFFERENTIAL
Eosinophils Absolute: 0.2
Eosinophils Relative: 2
Lymphs Abs: 3.6
Monocytes Relative: 10

## 2011-07-17 LAB — BASIC METABOLIC PANEL
Chloride: 103
GFR calc Af Amer: >60
Potassium: 3.1 — ABNORMAL LOW
Sodium: 140

## 2011-07-17 LAB — PROTIME-INR
INR: 1.3
Prothrombin Time: 16.6 — ABNORMAL HIGH

## 2011-07-18 ENCOUNTER — Encounter: Payer: Self-pay | Admitting: Pulmonary Disease

## 2011-07-25 LAB — DIFFERENTIAL
Eosinophils Absolute: 0.2 10*3/uL (ref 0.0–0.7)
Eosinophils Relative: 2 % (ref 0–5)
Lymphs Abs: 2.5 10*3/uL (ref 0.7–4.0)
Monocytes Absolute: 1.5 10*3/uL — ABNORMAL HIGH (ref 0.1–1.0)

## 2011-07-25 LAB — POCT CARDIAC MARKERS
CKMB, poc: 2 ng/mL (ref 1.0–8.0)
CKMB, poc: 2.5 ng/mL (ref 1.0–8.0)
Troponin i, poc: <0.05 ng/mL (ref 0.00–0.09)

## 2011-07-25 LAB — CBC
HCT: 42.9 % (ref 36.0–46.0)
Hemoglobin: 13.9 g/dL (ref 12.0–15.0)
MCV: 81.5 fL (ref 78.0–100.0)
Platelets: 324 10*3/uL (ref 150–400)
WBC: 12.9 10*3/uL — ABNORMAL HIGH (ref 4.0–10.5)

## 2011-07-25 LAB — POCT I-STAT, CHEM 8
BUN: 17 mg/dL (ref 6–23)
Calcium, Ion: 1.16 mmol/L (ref 1.12–1.32)
Chloride: 102 mEq/L (ref 96–112)
Creatinine, Ser: 0.7 mg/dL (ref 0.4–1.2)
Glucose, Bld: 192 mg/dL — ABNORMAL HIGH (ref 70–99)
HCT: 44 % (ref 36.0–46.0)

## 2011-07-25 LAB — PROTIME-INR: Prothrombin Time: 24.9 seconds — ABNORMAL HIGH (ref 11.6–15.2)

## 2011-08-01 NOTE — Telephone Encounter (Signed)
Close  

## 2011-08-12 ENCOUNTER — Ambulatory Visit (INDEPENDENT_AMBULATORY_CARE_PROVIDER_SITE_OTHER): Payer: Medicare Other | Admitting: General Surgery

## 2011-08-19 ENCOUNTER — Encounter (INDEPENDENT_AMBULATORY_CARE_PROVIDER_SITE_OTHER): Payer: Self-pay | Admitting: General Surgery

## 2011-08-19 ENCOUNTER — Ambulatory Visit (INDEPENDENT_AMBULATORY_CARE_PROVIDER_SITE_OTHER): Payer: Medicare Other | Admitting: General Surgery

## 2011-08-19 VITALS — BP 134/82 | HR 66 | Temp 97.4°F | Resp 16 | Ht 61.0 in | Wt 262.0 lb

## 2011-08-19 DIAGNOSIS — S335XXA Sprain of ligaments of lumbar spine, initial encounter: Secondary | ICD-10-CM

## 2011-08-19 DIAGNOSIS — S39012A Strain of muscle, fascia and tendon of lower back, initial encounter: Secondary | ICD-10-CM | POA: Insufficient documentation

## 2011-08-19 NOTE — Progress Notes (Signed)
Chief Complaint  Patient presents with  . Other    Eval of adrenal adenoma    HPI [name] is a 71 y.o. female.   HPI She is referred by Dr. Norberto Sorenson for evaluation of a right adrenal lesion as well as right lower back pain. The right adrenal lesion has been thoroughly evaluated. Chemical studies have been done and there is no evidence of a pheochromocytoma. She's had serial imaging studies which have shown no change in the right adrenal lesion. On MRI, it has characteristics consistent with an adenoma.  For the past 2 weeks, she has had gripping and sometimes shooting right paralumbar pain exacerbated with bending over or twisting. There is no pain or parathesia in her right leg.  Past Medical History  Diagnosis Date  . Atrial fibrillation   . Hypertension   . Diabetes mellitus   . Hyperlipidemia   . OSA (obstructive sleep apnea)   . Gout   . Atrial flutter   . Diastolic CHF, chronic   . Arthritis     Past Surgical History  Procedure Date  . Cholecystectomy   . Total abdominal hysterectomy   . Appendectomy   . Tonsillectomy 1982    Family History  Problem Relation Age of Onset  . Heart disease Father   . Breast cancer Sister   . Cancer Sister     breast    Social History History  Substance Use Topics  . Smoking status: Never Smoker   . Smokeless tobacco: Never Used  . Alcohol Use: No    No Known Allergies  Current Outpatient Prescriptions  Medication Sig Dispense Refill  . AZASITE 1 % ophthalmic solution       . colchicine (COLCRYS) 0.6 MG tablet Take 0.6 mg by mouth daily.        . cyclobenzaprine (FLEXERIL) 10 MG tablet Take 10 mg by mouth at bedtime.        Marland Kitchen erythromycin ophthalmic ointment       . gabapentin (NEURONTIN) 300 MG capsule Take 300 mg by mouth as needed.        Marland Kitchen HYDROcodone-acetaminophen (NORCO) 10-325 MG per tablet Take 1 tablet by mouth every 6 (six) hours as needed.        Marland Kitchen lisinopril (PRINIVIL,ZESTRIL) 40 MG tablet Take 40 mg  by mouth daily.        . metoprolol (LOPRESSOR) 100 MG tablet Take 1 tablet (100 mg total) by mouth 2 (two) times daily.      . potassium chloride (K-DUR) 10 MEQ tablet       . pravastatin (PRAVACHOL) 80 MG tablet       . prednisoLONE acetate (PRED FORTE) 1 % ophthalmic suspension       . tobramycin (TOBREX) 0.3 % ophthalmic solution       . triamterene-hydrochlorothiazide (MAXZIDE-25) 37.5-25 MG per tablet Take 1 tablet by mouth daily.        Marland Kitchen warfarin (COUMADIN) 5 MG tablet Take 5 mg by mouth daily.        Marland Kitchen diltiazem (TIAZAC) 240 MG 24 hr capsule Take 240 mg by mouth daily.          Review of Systems Review of Systems  Constitutional: Negative for fever, chills and unexpected weight change.  Respiratory: Negative for cough, choking and shortness of breath.   Cardiovascular: Positive for palpitations.  Genitourinary: Negative for hematuria and difficulty urinating.  Musculoskeletal: Positive for arthralgias.  Hematological:       On  chronic Coumadin.    Blood pressure 134/82, pulse 66, temperature 97.4 F (36.3 C), temperature source Temporal, resp. rate 16, height 5\' 1"  (1.549 m), weight 262 lb (118.842 kg).  Physical Exam Physical Exam  Constitutional: No distress.       Obese.  Abdominal: Soft. She exhibits no distension and no mass. There is no tenderness.  Musculoskeletal: She exhibits tenderness (right paralumbar region).  Skin: Skin is warm and dry.    Data Reviewed Dr. Alver Fisher notes.  Assessment      Right adrenal adenoma-I think is just needs to be monitored as they have been doing.  Acute right paralumbar strain-no clinical evidence of a herniated disc.  Plan       Apply moist heat to the back and avoid any heavy lifting or bending. She may need muscle relaxants for this. I explained to her this could take a long time to resolve. Return visit p.r.n.    Meekah Math J 08/19/2011, 5:54 PM

## 2011-08-19 NOTE — Patient Instructions (Signed)
You have a low back strain.  Avoid heavy lifting or bending.  This can take a long time to heal.

## 2011-08-20 ENCOUNTER — Telehealth: Payer: Self-pay | Admitting: Internal Medicine

## 2011-08-20 DIAGNOSIS — I4891 Unspecified atrial fibrillation: Secondary | ICD-10-CM

## 2011-08-20 NOTE — Telephone Encounter (Signed)
I spoke with Dr. Graciela Husbands. He states that the patient needs to come for an EKG, bmp/tsh/cbc/& magnesium level. She does not need PRN flecainide at this point. I have spoken with the patient and she will come in the morning for these things to be done.

## 2011-08-20 NOTE — Telephone Encounter (Signed)
New message  She she had afib the last three days  Please call

## 2011-08-20 NOTE — Telephone Encounter (Signed)
I spoke with the patient. On Sunday, she states that she was out of rhythm from 7am to 8pm. On Monday, she was out of rhythm about 5-6 hours, and then today she started to go out about 4:20am, but did not stay out long. She states that she has been under a lot of stress lately and she was also told at one point by [facility] to take a potassium supplement, but at her last lab draw, she was told this was fine. She has not been taking potassium. She is on lopressor 100mg  BID and diltiazem 240mg  daily. Per Dr. Odessa Fleming last office note, she could use PRN flecainide. She has not prescription for this, and I do not see where this was prescribed for her. I will review with Dr. Graciela Husbands and call her back today.

## 2011-08-21 ENCOUNTER — Other Ambulatory Visit (INDEPENDENT_AMBULATORY_CARE_PROVIDER_SITE_OTHER): Payer: Medicare Other | Admitting: *Deleted

## 2011-08-21 ENCOUNTER — Ambulatory Visit (INDEPENDENT_AMBULATORY_CARE_PROVIDER_SITE_OTHER): Payer: Medicare Other

## 2011-08-21 DIAGNOSIS — I4891 Unspecified atrial fibrillation: Secondary | ICD-10-CM

## 2011-08-21 LAB — BASIC METABOLIC PANEL
BUN: 19 mg/dL (ref 6–23)
Calcium: 9.2 mg/dL (ref 8.4–10.5)
Creatinine, Ser: 0.7 mg/dL (ref 0.4–1.2)
GFR: 89.96 mL/min (ref >60.00)
Glucose, Bld: 139 mg/dL — ABNORMAL HIGH (ref 70–99)

## 2011-08-21 LAB — CBC WITH DIFFERENTIAL/PLATELET
Basophils Relative: 0.4 % (ref 0.0–3.0)
Eosinophils Relative: 2.6 % (ref 0.0–5.0)
HCT: 42.8 % (ref 36.0–46.0)
Lymphs Abs: 3 10*3/uL (ref 0.7–4.0)
MCV: 83.9 fl (ref 78.0–100.0)
Monocytes Absolute: 1.2 10*3/uL — ABNORMAL HIGH (ref 0.1–1.0)
Neutro Abs: 7.1 10*3/uL (ref 1.4–7.7)
Platelets: 328 10*3/uL (ref 150.0–400.0)
RBC: 5.11 Mil/uL (ref 3.87–5.11)
WBC: 11.7 10*3/uL — ABNORMAL HIGH (ref 4.5–10.5)

## 2011-08-22 ENCOUNTER — Telehealth: Payer: Self-pay | Admitting: Internal Medicine

## 2011-08-22 DIAGNOSIS — I4891 Unspecified atrial fibrillation: Secondary | ICD-10-CM

## 2011-08-22 NOTE — Telephone Encounter (Signed)
routing

## 2011-08-22 NOTE — Telephone Encounter (Signed)
Pt called back please call

## 2011-08-22 NOTE — Telephone Encounter (Signed)
I reviewed with Dr. Graciela Husbands. Based on the patient's EKG yesterday, she was in NSR with PVC's. Dr. Graciela Husbands is uncertain if the patient is having a-fib vs. Pvc's at home. He recommends the patient wear a 30-day [place] monitor. I have advised the patient of this and she is agreeable with proceeding. I will place the order and the patient is aware to expect a call sometime next week for this.

## 2011-08-22 NOTE — Telephone Encounter (Signed)
New message  Pt said she was here yesterday and she is in afib again  Please call

## 2011-08-22 NOTE — Telephone Encounter (Signed)
I attempted to call the patient. I left her a message I would review her symptoms with Dr. Graciela Husbands and call her back after his morning clinic.

## 2011-08-26 ENCOUNTER — Encounter (INDEPENDENT_AMBULATORY_CARE_PROVIDER_SITE_OTHER): Payer: Medicare Other

## 2011-08-26 DIAGNOSIS — I4891 Unspecified atrial fibrillation: Secondary | ICD-10-CM

## 2011-09-03 ENCOUNTER — Encounter (INDEPENDENT_AMBULATORY_CARE_PROVIDER_SITE_OTHER): Payer: Self-pay

## 2011-09-05 ENCOUNTER — Other Ambulatory Visit: Payer: Self-pay | Admitting: *Deleted

## 2011-09-05 DIAGNOSIS — I4891 Unspecified atrial fibrillation: Secondary | ICD-10-CM

## 2011-09-05 MED ORDER — FLECAINIDE ACETATE 100 MG PO TABS: 100.0000 mg | ORAL_TABLET | Freq: Two times a day (BID) | ORAL | Status: DC

## 2011-09-05 NOTE — Telephone Encounter (Signed)
I left a message for the patient to call regarding event monitor results that Dr. Graciela Husbands has reviewed so far. He noted recurrent a-fib with some rapid rates. He recommends trying flecainide 100mg  twice daily with a GXT with Scott 3-4 days after starting. Dr. Graciela Husbands wants to f/u with the patient in 3-4 weeks.

## 2011-09-05 NOTE — Telephone Encounter (Signed)
I spoke with the patient. She is aware of her monitor results. She is agreeable with starting on flecainide. She will have a GXT on Tuesday 11/20 @ 2:30pm.

## 2011-09-09 ENCOUNTER — Encounter: Payer: Medicare Other | Admitting: Nurse Practitioner

## 2011-09-09 ENCOUNTER — Ambulatory Visit: Payer: Medicare Other | Admitting: Internal Medicine

## 2011-09-09 ENCOUNTER — Telehealth: Payer: Self-pay | Admitting: Internal Medicine

## 2011-09-09 VITALS — BP 122/74 | HR 110

## 2011-09-09 DIAGNOSIS — I4891 Unspecified atrial fibrillation: Secondary | ICD-10-CM

## 2011-09-09 NOTE — Patient Instructions (Addendum)
Your physician recommends that you continue on your current medications as directed. Please refer to the Current Medication list given to you today.  Call our office in the morning and let us know if your HR is still up / out of rhythm. If so, we will need to set you up for a cardioversion to be done at the hospital. Please ask to speak with the triage nurse.  Your coumadin level (INR) today is 2.8.

## 2011-09-09 NOTE — Telephone Encounter (Signed)
LMTC

## 2011-09-09 NOTE — Telephone Encounter (Signed)
Pt rtn call, needs to be called on cell number she is out, waiting for call in order to take medication, [phone]

## 2011-09-09 NOTE — Telephone Encounter (Signed)
I spoke with the patient. She states that she called yesterday about her HR being elevated. I do not see a phone note from the patient. She states her HR is up. She is concerned the flecainide is doing this. She is scheduled to have a GXT today due to flecainide initiation. I have instructed her to go ahead and come in so we can see what her rhythm is doing and if we need to further adjust her meds. She states she thinks she has gout in her knee. I explained if we need to cancel her GXT, then we can at least see what she is doing.

## 2011-09-09 NOTE — Progress Notes (Signed)
Patient ID: [name], female   DOB: 1941-06-21, 70 y.o.   MRN: [id number] The patient was seen today for stress testing. This was following initiation of flecainide for atrial fibrillation. She was noted to be in slow atrial flutter with an atrial cycle length of 280 ms. This started yesterday. Her INR was checked it was 2.9. In the event that she remains in flutter tomorrow we will undertake cardioversion.

## 2011-09-09 NOTE — Telephone Encounter (Signed)
FU Call: Pt said she called yesterday and left msg with person who was doing stress test (pt doesn't know the name of that person) to discuss pt increased HR up in the 100's. Please return pt call to discuss further.

## 2011-09-10 ENCOUNTER — Ambulatory Visit (HOSPITAL_COMMUNITY)
Admission: RE | Admit: 2011-09-10 | Discharge: 2011-09-10 | Disposition: A | Discharge status: Home / Self Care | Payer: Medicare Other | Source: Ambulatory Visit | Attending: Internal Medicine | Admitting: Internal Medicine

## 2011-09-10 ENCOUNTER — Other Ambulatory Visit: Payer: Self-pay | Admitting: *Deleted

## 2011-09-10 ENCOUNTER — Encounter (HOSPITAL_COMMUNITY): Payer: Self-pay | Admitting: Internal Medicine

## 2011-09-10 ENCOUNTER — Encounter (HOSPITAL_COMMUNITY)
Admission: RE | Disposition: A | Discharge status: Home / Self Care | Payer: Self-pay | Source: Ambulatory Visit | Attending: Internal Medicine

## 2011-09-10 ENCOUNTER — Telehealth: Payer: Self-pay | Admitting: Internal Medicine

## 2011-09-10 DIAGNOSIS — I5032 Chronic diastolic (congestive) heart failure: Secondary | ICD-10-CM | POA: Insufficient documentation

## 2011-09-10 DIAGNOSIS — M109 Gout, unspecified: Secondary | ICD-10-CM | POA: Insufficient documentation

## 2011-09-10 DIAGNOSIS — I4892 Unspecified atrial flutter: Secondary | ICD-10-CM

## 2011-09-10 DIAGNOSIS — E785 Hyperlipidemia, unspecified: Secondary | ICD-10-CM | POA: Insufficient documentation

## 2011-09-10 DIAGNOSIS — Z9889 Other specified postprocedural states: Secondary | ICD-10-CM | POA: Insufficient documentation

## 2011-09-10 DIAGNOSIS — I10 Essential (primary) hypertension: Secondary | ICD-10-CM | POA: Insufficient documentation

## 2011-09-10 DIAGNOSIS — E119 Type 2 diabetes mellitus without complications: Secondary | ICD-10-CM | POA: Insufficient documentation

## 2011-09-10 DIAGNOSIS — Z01812 Encounter for preprocedural laboratory examination: Secondary | ICD-10-CM | POA: Insufficient documentation

## 2011-09-10 DIAGNOSIS — M129 Arthropathy, unspecified: Secondary | ICD-10-CM | POA: Insufficient documentation

## 2011-09-10 DIAGNOSIS — Z7901 Long term (current) use of anticoagulants: Secondary | ICD-10-CM | POA: Insufficient documentation

## 2011-09-10 DIAGNOSIS — G4733 Obstructive sleep apnea (adult) (pediatric): Secondary | ICD-10-CM | POA: Insufficient documentation

## 2011-09-10 DIAGNOSIS — I509 Heart failure, unspecified: Secondary | ICD-10-CM | POA: Insufficient documentation

## 2011-09-10 DIAGNOSIS — I4891 Unspecified atrial fibrillation: Secondary | ICD-10-CM

## 2011-09-10 HISTORY — DX: Morbid (severe) obesity due to excess calories: E66.01

## 2011-09-10 HISTORY — PX: CARDIOVERSION: SHX1299

## 2011-09-10 LAB — DIFFERENTIAL
Basophils Absolute: 0.1 10*3/uL (ref 0.0–0.1)
Eosinophils Absolute: 0.2 10*3/uL (ref 0.0–0.7)
Eosinophils Relative: 2 % (ref 0–5)
Lymphocytes Relative: 23 % (ref 12–46)

## 2011-09-10 LAB — PROTIME-INR: Prothrombin Time: 25.8 seconds — ABNORMAL HIGH (ref 11.6–15.2)

## 2011-09-10 LAB — CBC
MCV: 82.5 fL (ref 78.0–100.0)
Platelets: 318 10*3/uL (ref 150–400)
RDW: 14.6 % (ref 11.5–15.5)
WBC: 13.6 10*3/uL — ABNORMAL HIGH (ref 4.0–10.5)

## 2011-09-10 SURGERY — CARDIOVERSION
Anesthesia: Monitor Anesthesia Care | Wound class: Clean

## 2011-09-10 MED ORDER — METOPROLOL TARTRATE 100 MG PO TABS: 50.0000 mg | ORAL_TABLET | Freq: Two times a day (BID) | ORAL | Status: DC

## 2011-09-10 MED ORDER — SODIUM CHLORIDE 0.9 % IV SOLN: INTRAVENOUS | Status: DC

## 2011-09-10 MED ORDER — HYDROCORTISONE 1 % EX CREA
1.0000 "application " | TOPICAL_CREAM | Freq: Three times a day (TID) | CUTANEOUS | Status: DC | PRN
  Filled 2011-09-10: qty 28

## 2011-09-10 MED ORDER — SODIUM CHLORIDE 0.9 % IJ SOLN: 3.0000 mL | INTRAMUSCULAR | Status: DC | PRN

## 2011-09-10 MED ORDER — MIDAZOLAM HCL 2 MG/2ML IJ SOLN
INTRAMUSCULAR | Status: AC
  Filled 2011-09-10: qty 2

## 2011-09-10 MED ORDER — MIDAZOLAM HCL 2 MG/2ML IJ SOLN
INTRAMUSCULAR | Status: AC
  Filled 2011-09-10: qty 4

## 2011-09-10 MED ORDER — SODIUM CHLORIDE 0.9 % IV SOLN: 250.0000 mL | INTRAVENOUS | Status: DC

## 2011-09-10 MED ORDER — FENTANYL CITRATE 0.05 MG/ML IJ SOLN
INTRAMUSCULAR | Status: AC
  Filled 2011-09-10: qty 2

## 2011-09-10 MED ORDER — SODIUM CHLORIDE 0.9 % IJ SOLN: 3.0000 mL | Freq: Two times a day (BID) | INTRAMUSCULAR | Status: DC

## 2011-09-10 MED ORDER — SODIUM CHLORIDE 0.9 % IV SOLN: 250.0000 mL | INTRAVENOUS | Status: AC

## 2011-09-10 NOTE — Telephone Encounter (Signed)
New message:  Pt was seen yesterday and was to call this am if heart rate was still up.  Please call her back and she stated that she might have to have a procedure.  She is waiting for call back.

## 2011-09-10 NOTE — Telephone Encounter (Signed)
Patient scheduled today for DCCV in EP lab with Dr Graciela Husbands to arrive at 1:30 (per lab) for 2:30, advised patient.  She is to continue to be NPO and will need someone to drive her home.  Verbalized understanding.

## 2011-09-10 NOTE — Brief Op Note (Signed)
Cardioversion with restoration of sinus rhythm    Dictated  Ferman Hamming 3:13 PM. 09/10/2011.

## 2011-09-10 NOTE — Op Note (Signed)
[name], CARNS NO.:  [id number]  MEDICAL RECORD NO.:  [id number]  LOCATION:  [facility]                         FACILITY:  [facility]  PHYSICIAN:  Duke Salvia, MD, FACCDATE OF BIRTH:  August 21, 1941  DATE OF PROCEDURE:  09/10/2011 DATE OF DISCHARGE:  09/10/2011                              OPERATIVE REPORT   PREOPERATIVE DIAGNOSIS:  Atrial flutter.  POSTOPERATIVE DIAGNOSIS:  Sinus rhythm.  PROCEDURE:  The patient was sedated with a combination of fentanyl and Versed.  She received 50 joules synchronized shock in an AP configuration.  Sinus rhythm was restored.  Heart rate was 40-50 following cardioversion.  The patient tolerated the procedure without apparent complication.     Duke Salvia, MD, [facility]     SCK/MEDQ  D:  09/10/2011  T:  09/10/2011  Job:  [id number]

## 2011-09-10 NOTE — H&P (Signed)
   HPI  [name] is a 70 y.o. female seen yesterday for treadmill testing and she was found to be in atril flutter with slow AA cycle length at 280 for VRate of 110 she is now submitted for cardioversion   She is on Coumadin given age gender, DM and hypertension and INR yesteday was 2.8   In 2007 echo and Myoview scanning both of which demonstrated normal left ventricular function and the latter no evidence of ischemia. T      Past Medical History  Diagnosis Date  . Atrial fibrillation /flutter   . Hypertension   . Diabetes mellitus   . Hyperlipidemia   . OSA (obstructive sleep apnea)   . Gout   . Atrial flutter     on flecanid  . Diastolic CHF, chronic   . Arthritis   . Morbid obesity     Past Surgical History  Procedure Date  . Cholecystectomy   . Total abdominal hysterectomy   . Appendectomy   . Tonsillectomy 1982    Current Facility-Administered Medications  Medication Dose Route Frequency Provider Last Rate Last Dose  . 0.9 %  sodium chloride infusion   Intravenous Continuous Duke Salvia, MD      . hydrocortisone cream 1 % 1 application  1 application Topical TID PRN Duke Salvia, MD        No Known Allergies  Review of Systems negative except from HPI and PMH  Physical Exam Well developed and well nourished in no acute distress HENT normal Airway #1 E scleral and icterus clear Neck Supple JVP flat; carotids brisk and full Clear to ausculation regular rate and rhythm Regular rate and rhythm, no murmurs gallops or rub Soft with active bowel sounds No clubbing cyanosis none Edema Alert and oriented, grossly normal motor and sensory function Skin Warm and Dry  ECG yesterday AFlutter 110  Assessment and  Plan  Aflutter  Plan DCCV  Risk and benefits reviewed  Anticipate post procedure discharge

## 2011-09-11 ENCOUNTER — Telehealth: Payer: Self-pay | Admitting: Physician Assistant

## 2011-09-11 NOTE — Telephone Encounter (Signed)
Patient with AFlutter s/p DCCV yesterday with restoration of NSR. Last night, started to note palpitations again.  HR 110-120. No chest pain.  No dyspnea.  No near syncope.   BP 133/99.  Has not taken any medications yet today. I have instructed her to take her morning medications (which included Flecainide, metoprolol and diltiazem). She remains on coumadin. I will leave a message at the office for her to be seen tomorrow (09/12/11). If she starts to feel worse today, she will go to the ED. Tereso Newcomer, PA-C  9:30 AM 09/11/2011

## 2011-09-12 ENCOUNTER — Ambulatory Visit (INDEPENDENT_AMBULATORY_CARE_PROVIDER_SITE_OTHER): Payer: Medicare Other | Admitting: Cardiovascular Disease

## 2011-09-12 ENCOUNTER — Encounter: Payer: Self-pay | Admitting: Cardiovascular Disease

## 2011-09-12 VITALS — BP 118/80 | HR 97 | Ht 61.5 in | Wt 266.0 lb

## 2011-09-12 DIAGNOSIS — I4892 Unspecified atrial flutter: Secondary | ICD-10-CM | POA: Insufficient documentation

## 2011-09-12 DIAGNOSIS — E119 Type 2 diabetes mellitus without complications: Secondary | ICD-10-CM

## 2011-09-12 DIAGNOSIS — I10 Essential (primary) hypertension: Secondary | ICD-10-CM

## 2011-09-12 DIAGNOSIS — E785 Hyperlipidemia, unspecified: Secondary | ICD-10-CM

## 2011-09-12 NOTE — Telephone Encounter (Signed)
I spoke with the patient this morning. She states she felt like her HR was back up on Wednesday night and all day yesterday. She is concerned. She will see Dr. Eden Emms (DOD) today at 3:00pm.

## 2011-09-12 NOTE — Assessment & Plan Note (Signed)
Cholesterol is at goal.  Continue current dose of statin and diet Rx.  No myalgias or side effects.  F/U  LFT's in 6 months. Lab Results  Component Value Date   LDLCALC 61 07/04/2010

## 2011-09-12 NOTE — Patient Instructions (Signed)
Your physician recommends that you schedule a follow-up appointment in: NEXT WEEK WITH DR Graciela Husbands Your physician recommends that you continue on your current medications as directed. Please refer to the Current Medication list given to you today.

## 2011-09-12 NOTE — Assessment & Plan Note (Signed)
Well controlled.  Continue current medications and low sodium Dash type diet.    

## 2011-09-12 NOTE — Progress Notes (Signed)
70 yo added on to schedule for palpitations.  Patient of Dr Graciela Husbands.  Just had DCC on 11/21.  Reviewed notes.  Appears that she has had afib in past and converted to flutter on flecainide and then had [facility] for flutter.  INR;s RX  Felt recurrent palpitations today and felt she was out of rhythm.  ECG shows typical flutter at rate of 97.  Dr Graciela Husbands not available.  Discussed issues with Dr Johney Frame.  Not clear if best approach is to maintain Flecainide and proceed with ablative procedure of flutter and or fib or to stop flecainide for a week and try new drug.  Personally I think maintaining drug and proceeding with ablation would be best approach.  As such I will not change her meds.  Told her to take extra lopresser if she feels her heart is rapid.  No history of structural heart diseae Not having any dyspnea, presyncope or SSCP.  Compliant with meds  ECG shows QRS and QT ok.    Reviewed event monitor from last two weeks.  Lots of afib prior to flecainide.    Time spent reviewing chart , event monitor and talking with Dr Allred 30 minutes not including time with patient  ROS: Denies fever, malais, weight loss, blurry vision, decreased visual acuity, cough, sputum, SOB, hemoptysis, pleuritic pain, palpitaitons, heartburn, abdominal pain, melena, lower extremity edema, claudication, or rash.  All other systems reviewed and negative  General: Affect appropriate Healthy:  appears stated age HEENT: normal Neck supple with no adenopathy JVP normal no bruits no thyromegaly Lungs clear with no wheezing and good diaphragmatic motion Heart:  S1/S2 no murmur,rub, gallop or click PMI normal Abdomen: benighn, BS positve, no tenderness, no AAA no bruit.  No HSM or HJR Distal pulses intact with no bruits No edema Neuro non-focal Skin warm and dry No muscular weakness   Current Outpatient Prescriptions  Medication Sig Dispense Refill  . colchicine (COLCRYS) 0.6 MG tablet Take 0.6 mg by mouth daily as needed.        . diltiazem (TIAZAC) 240 MG 24 hr capsule Take 240 mg by mouth daily.        . flecainide (TAMBOCOR) 100 MG tablet Take 1 tablet (100 mg total) by mouth 2 (two) times daily.  60 tablet  6  . HYDROcodone-acetaminophen (NORCO) 10-325 MG per tablet Take 1 tablet by mouth every 6 (six) hours as needed.        Marland Kitchen lisinopril (PRINIVIL,ZESTRIL) 40 MG tablet Take 40 mg by mouth daily.        . metoprolol (LOPRESSOR) 100 MG tablet Take 100 mg by mouth 2 (two) times daily.        . potassium chloride (K-DUR) 10 MEQ tablet       . pravastatin (PRAVACHOL) 80 MG tablet       . triamterene-hydrochlorothiazide (MAXZIDE-25) 37.5-25 MG per tablet Take 1 tablet by mouth daily.        Marland Kitchen warfarin (COUMADIN) 5 MG tablet Take 5 mg by mouth daily.          Allergies  Review of patient's allergies indicates no known allergies.  Electrocardiogram:  Atrial flutter rate 97  ICRBBB read as inferior ST elevation but these are flutter waves.  Assessment and Plan

## 2011-09-12 NOTE — Assessment & Plan Note (Signed)
Discussed low carb diet.  Target hemoglobin A1c is 6.5 or less.  Continue current medications.  

## 2011-09-12 NOTE — Assessment & Plan Note (Signed)
Described the various options to the patient and her daughter.  She will take an extra metroprolol if her HR seems high.  F/u SK Wendsday to discuss ablative options vs other drugs.  Continue anticoagulation.

## 2011-09-17 ENCOUNTER — Ambulatory Visit: Payer: Medicare Other | Admitting: Internal Medicine

## 2011-09-18 ENCOUNTER — Encounter: Payer: Self-pay | Admitting: Physician Assistant

## 2011-09-18 ENCOUNTER — Telehealth: Payer: Self-pay | Admitting: Physician Assistant

## 2011-09-18 ENCOUNTER — Other Ambulatory Visit (HOSPITAL_COMMUNITY): Payer: Self-pay | Admitting: Family Medicine

## 2011-09-18 ENCOUNTER — Ambulatory Visit (INDEPENDENT_AMBULATORY_CARE_PROVIDER_SITE_OTHER): Payer: Medicare Other | Admitting: Physician Assistant

## 2011-09-18 VITALS — BP 165/102 | HR 112 | Ht 61.0 in | Wt 269.8 lb

## 2011-09-18 DIAGNOSIS — I10 Essential (primary) hypertension: Secondary | ICD-10-CM

## 2011-09-18 DIAGNOSIS — G8929 Other chronic pain: Secondary | ICD-10-CM

## 2011-09-18 DIAGNOSIS — I4892 Unspecified atrial flutter: Secondary | ICD-10-CM

## 2011-09-18 NOTE — Telephone Encounter (Signed)
Patient called answering service last night to ask what a normal HR was. She was concerned because her HR was running 110's earlier, but was now running in the mid-50's and she was very worried this was too low. She feels completely fine. She was able to feel when her HR was up (has hx of atrial fib), but is asymptomatic. I reassured her that 48 is a normal heart rate. She has an appointment with Dr. Johney Frame today and is to discuss her fluctuating episodes of fast/slower HR with him. She plans to. She verbalized understanding and gratitude.

## 2011-09-18 NOTE — Assessment & Plan Note (Signed)
Patient is in atrial flutter at 113 beats per minute after recent ablation and treated with flecainide. Patient did not take any of her medications this morning. I asked her to take them immediately. We'll schedule her to see Dr. Graciela Husbands for discussion of possible ablation versus another drug.

## 2011-09-18 NOTE — Assessment & Plan Note (Signed)
Blood pressure elevated today. Patient did not take any of her medications. Patient states that her blood pressures never this high when she takes her drugs. I encouraged her tissue keep track of her blood pressure readings and bring them when she sees Dr. Graciela Husbands. She should also follow 2 g sodium diet.

## 2011-09-18 NOTE — Assessment & Plan Note (Signed)
Patient needs to lose weight.

## 2011-09-18 NOTE — Patient Instructions (Addendum)
Your physician recommends that you schedule a follow-up appointment in: 1 week with Dr. Graciela Husbands per Herma Carson, PA   Please remember to take your medications on time and everyday.  This is very important for health maintenance

## 2011-09-18 NOTE — Progress Notes (Signed)
HPI:  This is a 70 year old female patient who was just seen last week by Charlton Haws for recurrent palpitations and was found to be in atrial flutter. She does had DC cardioversion on 09/10/11. She's been in atrial fib and converted to flutter on flecainide and was briefly in sinus rhythm for 4 hours after cardioversion. The issues were discussed with Dr. All read who wasn't sure if the best approach was to maintain flecainide and proceed with ablation were stopped the flecainide for a week and try new drug. The patient was on the schedule to see Dr. Graciela Husbands today that he had a family emergency.  The patient states last night her heart rate was down in the 50s and she was worried. She did call was told that this was okay. She did not take any of her medication this morning and her heart rate and blood pressure or elevated on this office visit. She was asked to take an extra metoprolol for rapid heartbeats but she has not done this.  No Known Allergies  Current Outpatient Prescriptions on File Prior to Visit  Medication Sig Dispense Refill  . colchicine (COLCRYS) 0.6 MG tablet Take 0.6 mg by mouth daily as needed.       . diltiazem (TIAZAC) 240 MG 24 hr capsule Take 240 mg by mouth daily.        . flecainide (TAMBOCOR) 100 MG tablet Take 1 tablet (100 mg total) by mouth 2 (two) times daily.  60 tablet  6  . HYDROcodone-acetaminophen (NORCO) 10-325 MG per tablet Take 1 tablet by mouth every 6 (six) hours as needed.        Marland Kitchen lisinopril (PRINIVIL,ZESTRIL) 40 MG tablet Take 40 mg by mouth daily.        . metoprolol (LOPRESSOR) 100 MG tablet Take 100 mg by mouth 2 (two) times daily.        . potassium chloride (K-DUR) 10 MEQ tablet       . pravastatin (PRAVACHOL) 80 MG tablet       . triamterene-hydrochlorothiazide (MAXZIDE-25) 37.5-25 MG per tablet Take 1 tablet by mouth daily.        Marland Kitchen warfarin (COUMADIN) 5 MG tablet Take 5 mg by mouth daily.          Past Medical History  Diagnosis Date  . Atrial  fibrillation /flutter   . Hypertension   . Diabetes mellitus   . Hyperlipidemia   . OSA (obstructive sleep apnea)   . Gout   . Atrial flutter     on flecanid  . Diastolic CHF, chronic   . Arthritis   . Morbid obesity     Past Surgical History  Procedure Date  . Cholecystectomy   . Total abdominal hysterectomy   . Appendectomy   . Tonsillectomy 1982    Family History  Problem Relation Age of Onset  . Heart disease Father   . Breast cancer Sister   . Cancer Sister     breast    History   Social History  . Marital Status: Married    Spouse Name: N/A    Number of Children: Y  . Years of Education: N/A   Occupational History  . DISABILITY/housewife    Social History Main Topics  . Smoking status: Never Smoker   . Smokeless tobacco: Never Used  . Alcohol Use: No  . Drug Use: No  . Sexually Active: Not on file   Other Topics Concern  . Not on file  Social History Narrative  . No narrative on file    ROS: See HPI Eyes: Negative Ears:Negative for hearing loss, tinnitus Cardiovascular: Negative for chest pain,   near-syncope, orthopnea, paroxysmal nocturnal dyspnia and syncope,edema, claudication, cyanosis,.  Respiratory:   Negative for cough, hemoptysis, shortness of breath, sleep disturbances due to breathing, sputum production and wheezing.   Endocrine: Negative for cold intolerance and heat intolerance.  Hematologic/Lymphatic: Negative for adenopathy and bleeding problem. Does not bruise/bleed easily.  Musculoskeletal: Negative.   Gastrointestinal: Negative for nausea, vomiting, reflux, abdominal pain, diarrhea, constipation.   Neurological: Negative.  Allergic/Immunologic: Negative for environmental allergies.   PHYSICAL EXAM: Obese, in no acute distress. Neck: No JVD, HJR, Bruit, or thyroid enlargement Lungs: No tachypnea, clear without wheezing, rales, or rhonchi Cardiovascular: RRR, PMI not displaced, regular rate and rhythm at 113 beats per  minute,no murmurs, gallops, bruit, thrill, or heave. Abdomen: BS normal. Soft without organomegaly, masses, lesions or tenderness. Extremities: without cyanosis, clubbing or edema. Good distal pulses bilateral SKin: Warm, no lesions or rashes  Musculoskeletal: No deformities Neuro: no focal signs  BP 165/102  Pulse 112  Ht 5\' 1"  (1.549 m)  Wt 269 lb 12.8 oz (122.38 kg)  BMI 50.98 kg/m2  EKG:EKG read out as normal sinus rhythm but I suspect if atrial flutter at 113 beats per minute

## 2011-09-19 ENCOUNTER — Other Ambulatory Visit: Payer: Self-pay

## 2011-09-19 ENCOUNTER — Emergency Department (HOSPITAL_COMMUNITY): Payer: Medicare Other

## 2011-09-19 ENCOUNTER — Encounter (HOSPITAL_COMMUNITY): Payer: Self-pay | Admitting: *Deleted

## 2011-09-19 ENCOUNTER — Inpatient Hospital Stay (HOSPITAL_COMMUNITY)
Admission: EM | Admit: 2011-09-19 | Discharge: 2011-10-05 | DRG: 250 | Disposition: A | Discharge status: Home Health | Payer: Medicare Other | Attending: Internal Medicine | Admitting: Internal Medicine

## 2011-09-19 DIAGNOSIS — I509 Heart failure, unspecified: Secondary | ICD-10-CM

## 2011-09-19 DIAGNOSIS — I10 Essential (primary) hypertension: Secondary | ICD-10-CM | POA: Diagnosis present

## 2011-09-19 DIAGNOSIS — I4892 Unspecified atrial flutter: Secondary | ICD-10-CM

## 2011-09-19 DIAGNOSIS — M109 Gout, unspecified: Secondary | ICD-10-CM | POA: Diagnosis present

## 2011-09-19 DIAGNOSIS — R197 Diarrhea, unspecified: Secondary | ICD-10-CM

## 2011-09-19 DIAGNOSIS — E1159 Type 2 diabetes mellitus with other circulatory complications: Secondary | ICD-10-CM | POA: Insufficient documentation

## 2011-09-19 DIAGNOSIS — E875 Hyperkalemia: Secondary | ICD-10-CM

## 2011-09-19 DIAGNOSIS — Z23 Encounter for immunization: Secondary | ICD-10-CM

## 2011-09-19 DIAGNOSIS — Z7901 Long term (current) use of anticoagulants: Secondary | ICD-10-CM

## 2011-09-19 DIAGNOSIS — E785 Hyperlipidemia, unspecified: Secondary | ICD-10-CM | POA: Insufficient documentation

## 2011-09-19 DIAGNOSIS — M549 Dorsalgia, unspecified: Secondary | ICD-10-CM | POA: Insufficient documentation

## 2011-09-19 DIAGNOSIS — I5032 Chronic diastolic (congestive) heart failure: Secondary | ICD-10-CM | POA: Insufficient documentation

## 2011-09-19 DIAGNOSIS — G4733 Obstructive sleep apnea (adult) (pediatric): Secondary | ICD-10-CM | POA: Insufficient documentation

## 2011-09-19 DIAGNOSIS — I4891 Unspecified atrial fibrillation: Secondary | ICD-10-CM | POA: Diagnosis present

## 2011-09-19 DIAGNOSIS — I498 Other specified cardiac arrhythmias: Secondary | ICD-10-CM | POA: Diagnosis present

## 2011-09-19 DIAGNOSIS — J189 Pneumonia, unspecified organism: Secondary | ICD-10-CM

## 2011-09-19 DIAGNOSIS — E119 Type 2 diabetes mellitus without complications: Secondary | ICD-10-CM | POA: Insufficient documentation

## 2011-09-19 DIAGNOSIS — I480 Paroxysmal atrial fibrillation: Secondary | ICD-10-CM | POA: Insufficient documentation

## 2011-09-19 DIAGNOSIS — M129 Arthropathy, unspecified: Secondary | ICD-10-CM | POA: Diagnosis present

## 2011-09-19 DIAGNOSIS — J90 Pleural effusion, not elsewhere classified: Secondary | ICD-10-CM

## 2011-09-19 DIAGNOSIS — I152 Hypertension secondary to endocrine disorders: Secondary | ICD-10-CM | POA: Insufficient documentation

## 2011-09-19 DIAGNOSIS — E876 Hypokalemia: Secondary | ICD-10-CM

## 2011-09-19 DIAGNOSIS — J811 Chronic pulmonary edema: Secondary | ICD-10-CM

## 2011-09-19 DIAGNOSIS — I5033 Acute on chronic diastolic (congestive) heart failure: Secondary | ICD-10-CM | POA: Diagnosis present

## 2011-09-19 DIAGNOSIS — R001 Bradycardia, unspecified: Secondary | ICD-10-CM

## 2011-09-19 DIAGNOSIS — S39012A Strain of muscle, fascia and tendon of lower back, initial encounter: Secondary | ICD-10-CM | POA: Insufficient documentation

## 2011-09-19 HISTORY — DX: Dorsalgia, unspecified: M54.9

## 2011-09-19 LAB — TROPONIN I: Troponin I: <0.3 ng/mL (ref <0.30)

## 2011-09-19 LAB — GLUCOSE, CAPILLARY
Glucose-Capillary: 140 mg/dL — ABNORMAL HIGH (ref 70–99)
Glucose-Capillary: 167 mg/dL — ABNORMAL HIGH (ref 70–99)
Glucose-Capillary: 159 mg/dL — ABNORMAL HIGH (ref 70–99)

## 2011-09-19 LAB — CBC
Hemoglobin: 13.9 g/dL (ref 12.0–15.0)
MCH: 27.1 pg (ref 26.0–34.0)
MCV: 82.8 fL (ref 78.0–100.0)
Platelets: 350 10*3/uL (ref 150–400)
RBC: 5.12 MIL/uL — ABNORMAL HIGH (ref 3.87–5.11)
WBC: 13.6 10*3/uL — ABNORMAL HIGH (ref 4.0–10.5)

## 2011-09-19 LAB — BASIC METABOLIC PANEL
CO2: 27 mEq/L (ref 19–32)
Calcium: 9.3 mg/dL (ref 8.4–10.5)
Chloride: 100 mEq/L (ref 96–112)
Creatinine, Ser: 0.61 mg/dL (ref 0.50–1.10)
Glucose, Bld: 155 mg/dL — ABNORMAL HIGH (ref 70–99)

## 2011-09-19 LAB — PROTIME-INR: INR: 2.19 — ABNORMAL HIGH (ref 0.00–1.49)

## 2011-09-19 LAB — PRO B NATRIURETIC PEPTIDE: Pro B Natriuretic peptide (BNP): 986.4 pg/mL — ABNORMAL HIGH (ref 0–125)

## 2011-09-19 MED ORDER — FLECAINIDE ACETATE 100 MG PO TABS
100.0000 mg | ORAL_TABLET | Freq: Two times a day (BID) | ORAL | Status: DC
  Administered 2011-09-19 – 2011-09-25 (×12): 100 mg via ORAL
  Filled 2011-09-19 (×15): qty 1

## 2011-09-19 MED ORDER — POTASSIUM CHLORIDE 10 MEQ PO TBCR
40.0000 meq | EXTENDED_RELEASE_TABLET | Freq: Every day | ORAL | Status: DC
  Administered 2011-09-19 – 2011-09-20 (×2): 40 meq via ORAL
  Filled 2011-09-19 (×2): qty 4

## 2011-09-19 MED ORDER — POTASSIUM CHLORIDE CRYS ER 20 MEQ PO TBCR
40.0000 meq | EXTENDED_RELEASE_TABLET | Freq: Once | ORAL | Status: DC
  Administered 2011-09-20: 40 meq via ORAL
  Filled 2011-09-19: qty 2

## 2011-09-19 MED ORDER — INSULIN ASPART 100 UNIT/ML ~~LOC~~ SOLN
0.0000 [IU] | Freq: Three times a day (TID) | SUBCUTANEOUS | Status: DC
Start: 2011-09-19 — End: 2011-10-05
  Administered 2011-09-19: 3 [IU] via SUBCUTANEOUS
  Administered 2011-09-20: 5 [IU] via SUBCUTANEOUS
  Administered 2011-09-20: 2 [IU] via SUBCUTANEOUS
  Administered 2011-09-20 – 2011-09-21 (×2): 3 [IU] via SUBCUTANEOUS
  Administered 2011-09-21: 5 [IU] via SUBCUTANEOUS
  Administered 2011-09-21 – 2011-09-22 (×3): 3 [IU] via SUBCUTANEOUS
  Administered 2011-09-22: 5 [IU] via SUBCUTANEOUS
  Administered 2011-09-23: 2 [IU] via SUBCUTANEOUS
  Administered 2011-09-23: 3 [IU] via SUBCUTANEOUS
  Administered 2011-09-23: 2 [IU] via SUBCUTANEOUS
  Administered 2011-09-24 – 2011-09-25 (×4): 3 [IU] via SUBCUTANEOUS
  Administered 2011-09-25: 5 [IU] via SUBCUTANEOUS
  Administered 2011-09-26: 2 [IU] via SUBCUTANEOUS
  Administered 2011-09-26: 3 [IU] via SUBCUTANEOUS
  Administered 2011-09-26: 2 [IU] via SUBCUTANEOUS
  Administered 2011-09-27 (×2): 3 [IU] via SUBCUTANEOUS
  Administered 2011-09-27: 2 [IU] via SUBCUTANEOUS
  Administered 2011-09-28: 3 [IU] via SUBCUTANEOUS
  Administered 2011-09-28: 2 [IU] via SUBCUTANEOUS
  Administered 2011-09-28 – 2011-09-29 (×3): 3 [IU] via SUBCUTANEOUS
  Administered 2011-09-29 – 2011-09-30 (×2): 2 [IU] via SUBCUTANEOUS
  Administered 2011-09-30: 3 [IU] via SUBCUTANEOUS
  Administered 2011-09-30 – 2011-10-01 (×2): 2 [IU] via SUBCUTANEOUS
  Administered 2011-10-02: 3 [IU] via SUBCUTANEOUS
  Administered 2011-10-02 – 2011-10-03 (×3): 2 [IU] via SUBCUTANEOUS
  Administered 2011-10-04: 3 [IU] via SUBCUTANEOUS
  Administered 2011-10-04 – 2011-10-05 (×3): 2 [IU] via SUBCUTANEOUS
  Filled 2011-09-19 (×3): qty 3

## 2011-09-19 MED ORDER — FUROSEMIDE 10 MG/ML IJ SOLN
40.0000 mg | Freq: Once | INTRAMUSCULAR | Status: AC
  Administered 2011-09-19: 40 mg via INTRAVENOUS
  Filled 2011-09-19: qty 4

## 2011-09-19 MED ORDER — LISINOPRIL 40 MG PO TABS
40.0000 mg | ORAL_TABLET | Freq: Every day | ORAL | Status: DC
  Administered 2011-09-19 – 2011-10-05 (×17): 40 mg via ORAL
  Filled 2011-09-19 (×17): qty 1

## 2011-09-19 MED ORDER — SODIUM CHLORIDE 0.9 % IV SOLN: 250.0000 mL | INTRAVENOUS | Status: DC | PRN

## 2011-09-19 MED ORDER — ONDANSETRON HCL 4 MG/2ML IJ SOLN
4.0000 mg | Freq: Four times a day (QID) | INTRAMUSCULAR | Status: DC | PRN
  Administered 2011-09-25: 4 mg via INTRAVENOUS
  Filled 2011-09-19: qty 2

## 2011-09-19 MED ORDER — METOPROLOL TARTRATE 100 MG PO TABS
100.0000 mg | ORAL_TABLET | Freq: Two times a day (BID) | ORAL | Status: DC
  Administered 2011-09-19 – 2011-09-24 (×11): 100 mg via ORAL
  Filled 2011-09-19 (×12): qty 1

## 2011-09-19 MED ORDER — INSULIN ASPART 100 UNIT/ML ~~LOC~~ SOLN
0.0000 [IU] | Freq: Every day | SUBCUTANEOUS | Status: DC
  Administered 2011-10-01: 2 [IU] via SUBCUTANEOUS
  Filled 2011-09-19: qty 3

## 2011-09-19 MED ORDER — SIMVASTATIN 40 MG PO TABS
40.0000 mg | ORAL_TABLET | Freq: Every day | ORAL | Status: DC
  Administered 2011-09-19 – 2011-09-21 (×3): 40 mg via ORAL
  Filled 2011-09-19 (×3): qty 1

## 2011-09-19 MED ORDER — ACETAMINOPHEN 325 MG PO TABS
650.0000 mg | ORAL_TABLET | ORAL | Status: DC | PRN
Start: 2011-09-19 — End: 2011-10-05
  Administered 2011-09-23 – 2011-09-24 (×2): 650 mg via ORAL
  Filled 2011-09-19 (×2): qty 2

## 2011-09-19 MED ORDER — SODIUM CHLORIDE 0.9 % IJ SOLN
3.0000 mL | INTRAMUSCULAR | Status: DC | PRN
  Administered 2011-09-21: 3 mL via INTRAVENOUS

## 2011-09-19 MED ORDER — DILTIAZEM HCL ER BEADS 240 MG PO CP24
240.0000 mg | ORAL_CAPSULE | Freq: Every day | ORAL | Status: DC
  Administered 2011-09-19 – 2011-09-24 (×6): 240 mg via ORAL
  Filled 2011-09-19 (×6): qty 1

## 2011-09-19 MED ORDER — HYDROCODONE-ACETAMINOPHEN 10-325 MG PO TABS
1.0000 | ORAL_TABLET | Freq: Four times a day (QID) | ORAL | Status: DC | PRN
  Administered 2011-09-21 – 2011-09-23 (×7): 1 via ORAL
  Filled 2011-09-19 (×7): qty 1

## 2011-09-19 MED ORDER — INFLUENZA VIRUS VACC SPLIT PF IM SUSP
0.5000 mL | INTRAMUSCULAR | Status: AC
  Administered 2011-09-20: 0.5 mL via INTRAMUSCULAR
  Filled 2011-09-19: qty 0.5

## 2011-09-19 MED ORDER — FUROSEMIDE 10 MG/ML IJ SOLN
40.0000 mg | Freq: Two times a day (BID) | INTRAMUSCULAR | Status: DC
  Administered 2011-09-19 – 2011-09-23 (×8): 40 mg via INTRAVENOUS
  Filled 2011-09-19 (×9): qty 4

## 2011-09-19 MED ORDER — WARFARIN SODIUM 2.5 MG PO TABS: 2.5000 mg | ORAL_TABLET | Freq: Every day | ORAL | Status: DC

## 2011-09-19 MED ORDER — COLCHICINE 0.6 MG PO TABS
0.6000 mg | ORAL_TABLET | Freq: Every day | ORAL | Status: DC | PRN
  Filled 2011-09-19: qty 1

## 2011-09-19 MED ORDER — SODIUM CHLORIDE 0.9 % IJ SOLN
3.0000 mL | Freq: Two times a day (BID) | INTRAMUSCULAR | Status: DC
  Administered 2011-09-19 – 2011-09-23 (×9): 3 mL via INTRAVENOUS

## 2011-09-19 MED ORDER — WARFARIN SODIUM 5 MG PO TABS
5.0000 mg | ORAL_TABLET | Freq: Once | ORAL | Status: AC
  Administered 2011-09-19: 5 mg via ORAL
  Filled 2011-09-19: qty 1

## 2011-09-19 NOTE — ED Notes (Signed)
Returned from  X-ray

## 2011-09-19 NOTE — Progress Notes (Signed)
ANTICOAGULATION CONSULT NOTE - Initial Consult  Pharmacy Consult for Coumadin Indication: atrial fibrillation  No Known Allergies  Patient Measurements: Weight: 271 lb (122.925 kg)   Vital Signs: Temp: 97.8 F (36.6 C) (11/30 1257) Temp src: Oral (11/30 1257) BP: 166/97 mmHg (11/30 1257) Pulse Rate: 95  (11/30 1257)  Labs:  [facility] 09/19/11 0520  HGB 13.9  HCT 42.4  PLT 350  APTT --  LABPROT 24.7*  INR 2.19*  HEPARINUNFRC --  CREATININE 0.61  CKTOTAL --  CKMB --  TROPONINI <0.30   The CrCl is unknown because both a height and weight (above a minimum accepted value) are required for this calculation.  Medical History: Past Medical History  Diagnosis Date  . Atrial fibrillation /flutter   . Hypertension   . Diabetes mellitus   . Hyperlipidemia   . Gout   . Atrial flutter     dccv: 08/2011 - on Flecainide/coumadin  . Diastolic CHF, chronic     echo 2006 - ef 55-65%; mild diast dysfxn  . Arthritis   . Morbid obesity   . OSA (obstructive sleep apnea)     uses cpap  . CHF (congestive heart failure)     Medications:  Scheduled:    . diltiazem  240 mg Oral Daily  . flecainide  100 mg Oral BID  . furosemide  40 mg Intravenous Once  . furosemide  40 mg Intravenous BID  . influenza  inactive virus vaccine  0.5 mL Intramuscular Tomorrow-1000  . insulin aspart  0-15 Units Subcutaneous TID WC  . insulin aspart  0-5 Units Subcutaneous QHS  . lisinopril  40 mg Oral Daily  . metoprolol  100 mg Oral BID  . potassium chloride  40 mEq Oral Daily  . potassium chloride  40 mEq Oral Once  . simvastatin  40 mg Oral q1800  . sodium chloride  3 mL Intravenous Q12H  . DISCONTD: warfarin  2.5-5 mg Oral Daily    Assessment: 70 year old on Coumadin prior to admission for aflutter.  Therapeutic INR at 2.19.  Home dose is Coumadin 5 mg daily except for 2.5 mg on Wednesdays.  Goal of Therapy:  INR 2-3   Plan:  1) Coumadin 5 mg po x 1 dose today at 1800 2) Daily PT /  INR  Elwin Sleight 09/19/2011,1:24 PM

## 2011-09-19 NOTE — ED Notes (Signed)
Per EMS - pt from home - states she has recently been seen by PCP for a-flutter and was placed on home heart monitor. Pt was seen here x1 week ago and cardioverted for this as well. Pt reports rt neck and shoulder pain, shortness of breath, and noted to have a-flutter on monitor that began approx 0230 this a.m.

## 2011-09-19 NOTE — ED Notes (Signed)
Cardiology at the bedside to evaluated.

## 2011-09-19 NOTE — Progress Notes (Signed)
Call from ED asking for Home Health CHF referral.  In to see patient who selected [facility] to provide services.  Per patient and nurse she is being admitted to the hospital. Will follow up with CHF case manager. Jim Like RN BSN CCM

## 2011-09-19 NOTE — H&P (Signed)
Patient ID: [name] MRN: [id number], DOB/AGE: [birth year]   Admit date: 09/19/2011   Primary Physician: Norberto Sorenson, MD Primary Cardiologist: Tawni Pummel, STEVEN  Pt. Profile:   70 y/o female with h/o of a.fib and flutter, currently on Flecainide, s/p dccv last week who presents with persistent flutter and dyspnea/chf  Problem List: Past Medical History  Diagnosis Date  . Atrial fibrillation /flutter   . Hypertension   . Diabetes mellitus   . Hyperlipidemia   . Gout   . Atrial flutter     dccv: 08/2011 - on Flecainide/coumadin  . Diastolic CHF, chronic     echo 2006 - ef 55-65%; mild diast dysfxn  . Arthritis   . Morbid obesity   . OSA (obstructive sleep apnea)     uses cpap  . CHF (congestive heart failure)     Past Surgical History  Procedure Date  . Cholecystectomy   . Total abdominal hysterectomy   . Appendectomy   . Tonsillectomy 1982     Allergies: No Known Allergies  HPI:   70 y/o female with the above problem list.  Pt was recently started on flecainide for atrial fibrillation and was subsequently found to be in a.flutter on 11/20.  She underwent dccv on 11/21 and maintained sinus rhythm for only a few hrs prior to reverting to a.flutter.  She has since been maintained on flecainide and metoprolol and has been paying close attention to her HR's @ home.  Her HR's are either in the 50's or in the 1-teens.  This has been concerning for her and she has had frequent correspondence with the office and was seen 11/23 and b/c of persistent high HR's, again yesterday.  In the office yesterday, she was found to be in a.flutter @ a rate of 113 but was otherwise stable.  It was determined that she would f/u with Dr. Graciela Husbands next week for consideration of rfca of a.flutter vs initiation of an alternate antiarrhythmic.  Unfortunately, after using the bathroom last night, pt felt very sob.  No c/p.  She called 911 and was taken to the [facility] ED where she was found to  have mild elevation of pbnp and her cxr shows pulm edema.  She has been treated with one dose of IV lasix and has begun voiding.  Currently she has no complaints.   Home Medications Medications Prior to Admission - GIVEN IN ED TODAY  Medication Dose Route Frequency Provider Last Rate Last Dose  . furosemide (LASIX) injection 40 mg  40 mg Intravenous Once Lyanne Co, MD   40 mg at 09/19/11 0844  . influenza  inactive virus vaccine (FLUZONE/FLUARIX) injection 0.5 mL  0.5 mL Intramuscular Tomorrow-1000 Lyanne Co, MD       Medications Prior to Admission  Medication Sig Dispense Refill  . colchicine (COLCRYS) 0.6 MG tablet Take 0.6 mg by mouth daily as needed. For gout      . diltiazem (TIAZAC) 240 MG 24 hr capsule Take 240 mg by mouth daily.        . flecainide (TAMBOCOR) 100 MG tablet Take 1 tablet (100 mg total) by mouth 2 (two) times daily.  60 tablet  6  . HYDROcodone-acetaminophen (NORCO) 10-325 MG per tablet Take 1 tablet by mouth every 6 (six) hours as needed. For pain      . lisinopril (PRINIVIL,ZESTRIL) 40 MG tablet Take 40 mg by mouth daily.        . metoprolol (LOPRESSOR) 100 MG tablet  Take 100 mg by mouth 2 (two) times daily.        . potassium chloride (K-DUR) 10 MEQ tablet       . pravastatin (PRAVACHOL) 80 MG tablet Take 80 mg by mouth daily.       Marland Kitchen triamterene-hydrochlorothiazide (MAXZIDE-25) 37.5-25 MG per tablet Take 1 tablet by mouth daily.        Marland Kitchen warfarin (COUMADIN) 5 MG tablet Take 2.5-5 mg by mouth daily. 1 tablet every day except take 1/2 tablet on wednesdays         Family History  Problem Relation Age of Onset  . Heart disease Father   . Breast cancer Sister   . Cancer Sister     breast    History   Social History  . Marital Status: Married    Spouse Name: N/A    Number of Children: Y  . Years of Education: N/A   Occupational History  . DISABILITY/housewife    Social History Main Topics  . Smoking status: Never Smoker   . Smokeless  tobacco: Never Used  . Alcohol Use: No  . Drug Use: No  . Sexually Active: Not Currently    Birth Control/ Protection: Post-menopausal   Other Topics Concern  . Not on file   Social History Narrative  . No narrative on file     Review of Systems: General: negative for chills, fever, night sweats or weight changes.  Cardiovascular: + orthopnea/doe/lee.  negative for chest pain, palpitations, paroxysmal nocturnal dyspnea Dermatological: negative for rash.  Respiratory: + for sob/orthopnea. Urologic: negative for hematuria Abdominal: negative for nausea, vomiting, diarrhea, bright red blood per rectum, melena, or hematemesis Neurologic: negative for visual changes, syncope, or dizziness All other systems reviewed and are otherwise negative except as noted above.  Physical Exam: Blood pressure 171/108, pulse 109, temperature 98 F (36.7 C), temperature source Oral, resp. rate 28, SpO2 96.00%.  General: obese female in no acute distress. Head: Normocephalic, atraumatic, sclera non-icteric, no xanthomas, nares are without discharge.  Neck: Supple without bruits.  Obese- difficult to assess jvp. Lungs:  Resp regular and unlabored, bibasilar crackles r>l Heart: irreg, tachy, distant.  no s3, s4, or murmurs. Abdomen: Soft, non-tender, non-distended, BS + x 4.  Msk:  Strength and tone appears normal for age. Extremities: No clubbing, cyanosis.  1+ bilat LEE. DP/PT/Radials 2+ and equal bilaterally. Neuro: Alert and oriented X 3. Moves all extremities spontaneously. Psych: Normal affect.   Labs:   Results for orders placed during the hospital encounter of 09/19/11 (from the past 72 hour(s))  CBC     Status: Abnormal   Collection Time   09/19/11  5:20 AM      Component Value Range Comment   WBC 13.6 (*) 4.0 - 10.5 (K/uL)    RBC 5.12 (*) 3.87 - 5.11 (MIL/uL)    Hemoglobin 13.9  12.0 - 15.0 (g/dL)    HCT 16.1  09.6 - 04.5 (%)    MCV 82.8  78.0 - 100.0 (fL)    MCH 27.1  26.0 - 34.0  (pg)    MCHC 32.8  30.0 - 36.0 (g/dL)    RDW 40.9  81.1 - 91.4 (%)    Platelets 350  150 - 400 (K/uL)   BASIC METABOLIC PANEL     Status: Abnormal   Collection Time   09/19/11  5:20 AM      Component Value Range Comment   Sodium 141  135 - 145 (mEq/L)  Potassium 3.3 (*) 3.5 - 5.1 (mEq/L)    Chloride 100  96 - 112 (mEq/L)    CO2 27  19 - 32 (mEq/L)    Glucose, Bld 155 (*) 70 - 99 (mg/dL)    BUN 19  6 - 23 (mg/dL)    Creatinine, Ser 4.40  0.50 - 1.10 (mg/dL)    Calcium 9.3  8.4 - 10.5 (mg/dL)    GFR calc non Af Amer >90  >90 (mL/min)    GFR calc Af Amer >90  >90 (mL/min)   TROPONIN I     Status: Normal   Collection Time   09/19/11  5:20 AM      Component Value Range Comment   Troponin I <0.30  <0.30 (ng/mL)   PROTIME-INR     Status: Abnormal   Collection Time   09/19/11  5:20 AM      Component Value Range Comment   Prothrombin Time 24.7 (*) 11.6 - 15.2 (seconds)    INR 2.19 (*) 0.00 - 1.49    PRO B NATRIURETIC PEPTIDE     Status: Abnormal   Collection Time   09/19/11  7:34 AM      Component Value Range Comment   BNP, POC 986.4 (*) 0 - 125 (pg/mL)      Radiology/Studies: Dg Chest 2 View  09/19/2011  *RADIOLOGY REPORT*  Clinical Data: Shortness of breath and right neck pain.  CHEST - 2 VIEW  Comparison: Chest radiograph performed 10/14/2010  Findings: The lungs are well-aerated.  Vascular congestion is noted, with diffusely increased interstitial markings, most compatible with pulmonary edema.  No significant pleural effusion or pneumothorax is seen.  The heart is mildly enlarged; calcification is noted within the aortic arch.  No acute osseous abnormalities are seen.  IMPRESSION: Vascular congestion and mild cardiomegaly, with diffusely increased interstitial markings, most compatible with pulmonary edema.  Original Report Authenticated By: Tonia Ghent, M.D.    EKG:  A.flutter 110, no acute st/t changes  ASSESSMENT AND PLAN:   1.  A.Flutter:  Now symptomatic in the  setting of diast dysfxn resulting in pulm edema.  Plan to admit and diurese.  Cont flecainide, bb, ccb, coumadin.  INR therapeutic.  Check echo since it's been 6 yrs, to be sure that persistent tachycardia hasn't negatively impacted LV fxn.  Will review with EP but most likely plan would be to diurese, consider rfca this afternoon if possible.  If not possible to perform rfca today, consider adjustment of outpt diuretics, early d/c and early f/u next week with SK.    2.  Acute on Chronic Diast CHF:  In setting of #1.  BP and HR up.  Hasn't taken morning meds.  Diurese as above and cont home meds.  Check Echo.  3.  DM:  ? Diet controlled.  Add SSI  4.  Hypokalemia:  Supp.  5.  HTN:  Cont acei/bb/ccb.  Hold home dose of triam/hctz as we're using Lasix.   Signed, Nicolasa Ducking, NP 09/19/2011, 9:52 AM  Patient seen and examined.  Agree with findings of C Berge.Patient with a history of atrial fibrillation on flecanide and now atrial flutter with uncontrolled rates.  On exam evidence of volume overload. Plan to control rates.  Diurese with lasix.  Will order echo.  [facility] Odessa Fleming is not available  Will discuss with EP plans for ablation.

## 2011-09-19 NOTE — Progress Notes (Signed)
  Echocardiogram 2D Echocardiogram has been performed.  [name] Nira Retort 09/19/2011, 4:32 PM

## 2011-09-19 NOTE — ED Provider Notes (Signed)
History     CSN: [id number] Arrival date & time: 09/19/2011  4:56 AM   First MD Initiated Contact with Patient 09/19/11 [phone]      Chief Complaint  Patient presents with  . Chest Pain  . Atrial Flutter  . Shortness of Breath    (Consider location/radiation/quality/duration/timing/severity/associated sxs/prior treatment) The history is provided by the patient.   patient reports a history of atrial flutter with recent cardioversion last week.  She woke this morning with significant shortness of breath.  She denies lower extremity edema.  She reported mild orthopnea without PND.  She is taking her Coumadin and her other medications as prescribed.  She has no prior history of congestive heart failure.  She reports that her electrophysiologist may consider ablation in the coming week.  She denies fevers or chills.  Denies cough.  She denies abdominal pain.  She denies any chest pain at this time.  Nothing worsens her symptoms except for lying flat.  Nothing improves her symptoms.  Her symptoms are mild to moderate  Past Medical History  Diagnosis Date  . Atrial fibrillation /flutter   . Hypertension   . Diabetes mellitus   . Hyperlipidemia   . OSA (obstructive sleep apnea)   . Gout   . Atrial flutter     on flecanid  . Diastolic CHF, chronic   . Arthritis   . Morbid obesity     Past Surgical History  Procedure Date  . Cholecystectomy   . Total abdominal hysterectomy   . Appendectomy   . Tonsillectomy 1982    Family History  Problem Relation Age of Onset  . Heart disease Father   . Breast cancer Sister   . Cancer Sister     breast    History  Substance Use Topics  . Smoking status: Never Smoker   . Smokeless tobacco: Never Used  . Alcohol Use: No    OB History    Grav Para Term Preterm Abortions TAB SAB Ect Mult Living                  Review of Systems  Respiratory: Positive for shortness of breath.   Cardiovascular: Positive for chest pain.  All other  systems reviewed and are negative.    Allergies  Review of patient's allergies indicates no known allergies.  Home Medications   Current Outpatient Rx  Name Route Sig Dispense Refill  . COLCHICINE 0.6 MG PO TABS Oral Take 0.6 mg by mouth daily as needed. For gout    . DILTIAZEM HCL ER BEADS 240 MG PO CP24 Oral Take 240 mg by mouth daily.      Marland Kitchen FLECAINIDE ACETATE 100 MG PO TABS Oral Take 1 tablet (100 mg total) by mouth 2 (two) times daily. 60 tablet 6  . HYDROCODONE-ACETAMINOPHEN 10-325 MG PO TABS Oral Take 1 tablet by mouth every 6 (six) hours as needed. For pain    . LISINOPRIL 40 MG PO TABS Oral Take 40 mg by mouth daily.      Marland Kitchen METOPROLOL TARTRATE 100 MG PO TABS Oral Take 100 mg by mouth 2 (two) times daily.      Marland Kitchen POTASSIUM CHLORIDE CR 10 MEQ PO TBCR      . PRAVASTATIN SODIUM 80 MG PO TABS Oral Take 80 mg by mouth daily.     . TRIAMTERENE-HCTZ 37.5-25 MG PO TABS Oral Take 1 tablet by mouth daily.      . WARFARIN SODIUM 5 MG PO TABS Oral  Take 2.5-5 mg by mouth daily. 1 tablet every day except take 1/2 tablet on wednesdays      BP 163/109  Pulse 103  Temp(Src) 98 F (36.7 C) (Oral)  Resp 28  SpO2 94%  Physical Exam  Nursing note and vitals reviewed. Constitutional: She is oriented to person, place, and time. She appears well-developed and well-nourished. No distress.  HENT:  Head: Normocephalic and atraumatic.  Eyes: EOM are normal.  Neck: Normal range of motion.  Cardiovascular: Normal rate, regular rhythm and normal heart sounds.   Pulmonary/Chest: Effort normal. No respiratory distress. She has no wheezes. She has rales.  Abdominal: Soft. She exhibits no distension. There is no tenderness.  Musculoskeletal: Normal range of motion.  Neurological: She is alert and oriented to person, place, and time.  Skin: Skin is warm and dry.  Psychiatric: She has a normal mood and affect. Judgment normal.    ED Course  Procedures (including critical care time)   Date:  09/19/2011  Rate: 110  Rhythm: Atrial flutter  QRS Axis: normal  Intervals: normal  ST/T Wave abnormalities: normal  Conduction Disutrbances:none  Narrative Interpretation:   Old EKG Reviewed: No significant changes noted     Labs Reviewed  CBC - Abnormal; Notable for the following:    WBC 13.6 (*)    RBC 5.12 (*)    All other components within normal limits  BASIC METABOLIC PANEL - Abnormal; Notable for the following:    Potassium 3.3 (*)    Glucose, Bld 155 (*)    All other components within normal limits  PRO B NATRIURETIC PEPTIDE - Abnormal; Notable for the following:    BNP, POC 986.4 (*)    All other components within normal limits  PROTIME-INR - Abnormal; Notable for the following:    Prothrombin Time 24.7 (*)    INR 2.19 (*)    All other components within normal limits  TROPONIN I   Dg Chest 2 View  09/19/2011  *RADIOLOGY REPORT*  Clinical Data: Shortness of breath and right neck pain.  CHEST - 2 VIEW  Comparison: Chest radiograph performed 10/14/2010  Findings: The lungs are well-aerated.  Vascular congestion is noted, with diffusely increased interstitial markings, most compatible with pulmonary edema.  No significant pleural effusion or pneumothorax is seen.  The heart is mildly enlarged; calcification is noted within the aortic arch.  No acute osseous abnormalities are seen.  IMPRESSION: Vascular congestion and mild cardiomegaly, with diffusely increased interstitial markings, most compatible with pulmonary edema.  Original Report Authenticated By: Tonia Ghent, M.D.   I personally reviewed the patient's x-ray  1. Atrial flutter   2. Pulmonary edema       MDM  Patient atrial flutter at a rate of 110 and what appears to be new pulmonary edema and possible heart failure.  Her BNP is elevated.  There is edema on her chest x-ray.  The patient's been given an IV dose of Lasix.  Health for treatment of her atrial flutter to her electrophysiology team.  Awaiting  callback from the cardiology team at this time.  The patient is not in overt distress  I reviewed the patient's prior labs x-ray and clinic note        Lyanne Co, MD 09/19/11 [phone]

## 2011-09-20 ENCOUNTER — Encounter (HOSPITAL_COMMUNITY): Payer: Self-pay | Admitting: Cardiology

## 2011-09-20 ENCOUNTER — Other Ambulatory Visit: Payer: Self-pay

## 2011-09-20 LAB — PROTIME-INR: Prothrombin Time: 24.7 seconds — ABNORMAL HIGH (ref 11.6–15.2)

## 2011-09-20 LAB — BASIC METABOLIC PANEL
BUN: 20 mg/dL (ref 6–23)
Chloride: 96 mEq/L (ref 96–112)
GFR calc Af Amer: >90 mL/min (ref >90)
GFR calc non Af Amer: >90 mL/min (ref >90)
Potassium: 2.7 mEq/L — CL (ref 3.5–5.1)
Sodium: 138 mEq/L (ref 135–145)

## 2011-09-20 LAB — GLUCOSE, CAPILLARY: Glucose-Capillary: 179 mg/dL — ABNORMAL HIGH (ref 70–99)

## 2011-09-20 MED ORDER — POTASSIUM CHLORIDE 20 MEQ PO PACK: 40.0000 meq | PACK | Freq: Once | ORAL | Status: DC

## 2011-09-20 MED ORDER — POTASSIUM CHLORIDE CRYS ER 20 MEQ PO TBCR
EXTENDED_RELEASE_TABLET | ORAL | Status: AC
  Administered 2011-09-20: 40 meq via ORAL
  Filled 2011-09-20: qty 2

## 2011-09-20 MED ORDER — POTASSIUM CHLORIDE CRYS ER 20 MEQ PO TBCR
40.0000 meq | EXTENDED_RELEASE_TABLET | ORAL | Status: DC
  Administered 2011-09-20: 40 meq via ORAL

## 2011-09-20 MED ORDER — POTASSIUM CHLORIDE CRYS ER 20 MEQ PO TBCR
40.0000 meq | EXTENDED_RELEASE_TABLET | Freq: Two times a day (BID) | ORAL | Status: AC
  Administered 2011-09-20 (×2): 40 meq via ORAL
  Filled 2011-09-20 (×2): qty 2

## 2011-09-20 MED ORDER — WARFARIN SODIUM 5 MG PO TABS
5.0000 mg | ORAL_TABLET | Freq: Once | ORAL | Status: AC
  Administered 2011-09-20: 5 mg via ORAL
  Filled 2011-09-20: qty 1

## 2011-09-20 MED ORDER — POTASSIUM CHLORIDE 20 MEQ PO PACK
40.0000 meq | PACK | Freq: Three times a day (TID) | ORAL | Status: DC
  Filled 2011-09-20 (×3): qty 2

## 2011-09-20 NOTE — Progress Notes (Signed)
ANTICOAGULATION CONSULT NOTE - Follow Up Consult  Pharmacy Consult for Warfarin Indication: Atrial Fibrillation  No Known Allergies  Patient Measurements: Height: 5\' 1"  (154.9 cm) Weight: 257 lb 6.4 oz (116.756 kg) IBW/kg (Calculated) : 47.8    Vital Signs: Temp: 97.6 F (36.4 C) (12/01 0509) Temp src: Oral (12/01 0509) BP: 126/79 mmHg (12/01 0509) Pulse Rate: 123  (12/01 1024)  Labs:  Basename 09/20/11 0500 09/19/11 0520  HGB -- 13.9  HCT -- 42.4  PLT -- 350  APTT -- --  LABPROT 24.7* 24.7*  INR 2.19* 2.19*  HEPARINUNFRC -- --  CREATININE 0.64 0.61  CKTOTAL -- --  CKMB -- --  TROPONINI -- <0.30   Estimated Creatinine Clearance: 81.2 ml/min (by C-G formula based on Cr of 0.64).   Medications:  Prescriptions prior to admission  Medication Sig Dispense Refill  . colchicine (COLCRYS) 0.6 MG tablet Take 0.6 mg by mouth daily as needed. For gout      . diltiazem (TIAZAC) 240 MG 24 hr capsule Take 240 mg by mouth daily.        . flecainide (TAMBOCOR) 100 MG tablet Take 1 tablet (100 mg total) by mouth 2 (two) times daily.  60 tablet  6  . HYDROcodone-acetaminophen (NORCO) 10-325 MG per tablet Take 1 tablet by mouth every 6 (six) hours as needed. For pain      . lisinopril (PRINIVIL,ZESTRIL) 40 MG tablet Take 40 mg by mouth daily.        . metoprolol (LOPRESSOR) 100 MG tablet Take 100 mg by mouth 2 (two) times daily.        . potassium chloride (K-DUR) 10 MEQ tablet       . pravastatin (PRAVACHOL) 80 MG tablet Take 80 mg by mouth daily.       Marland Kitchen triamterene-hydrochlorothiazide (MAXZIDE-25) 37.5-25 MG per tablet Take 1 tablet by mouth daily.        Marland Kitchen warfarin (COUMADIN) 5 MG tablet Take 2.5-5 mg by mouth daily. 1 tablet every day except take 1/2 tablet on wednesdays       Scheduled:    . diltiazem  240 mg Oral Daily  . flecainide  100 mg Oral BID  . furosemide  40 mg Intravenous BID  . influenza  inactive virus vaccine  0.5 mL Intramuscular Tomorrow-1000  . insulin  aspart  0-15 Units Subcutaneous TID WC  . insulin aspart  0-5 Units Subcutaneous QHS  . lisinopril  40 mg Oral Daily  . metoprolol  100 mg Oral BID  . potassium chloride SA      . potassium chloride  40 mEq Oral BID  . simvastatin  40 mg Oral q1800  . sodium chloride  3 mL Intravenous Q12H  . warfarin  5 mg Oral ONCE-1800  . DISCONTD: potassium chloride  40 mEq Oral Daily  . DISCONTD: potassium chloride  40 mEq Oral Once  . DISCONTD: potassium chloride  40 mEq Oral Once  . DISCONTD: potassium chloride  40 mEq Oral TID  . DISCONTD: potassium chloride  40 mEq Oral Once  . DISCONTD: potassium chloride  40 mEq Oral Q2H  . DISCONTD: warfarin  2.5-5 mg Oral Daily   Warfarin History: PTA dose was 5 mg daily EXCEPT for 2.5 mg on Wednesdays only 11/30: Warfarin 5 mg (INR 2.19 >> 2.19)  Assessment: 70 y.o. F on warfarin for Afib with a therapeutic INR this a.m. No signs/symptoms of bleeding noted.  Goal of Therapy:  INR 2-3   Plan:  1. Warfarin 5 mg x 1 dose at 1800 today 2. Will continue to monitor for any signs/symptoms of bleeding and will follow up with PT/INR in the a.m.   Rolley Sims 09/20/2011,12:58 PM

## 2011-09-20 NOTE — Progress Notes (Addendum)
Patient ID: [name], female   DOB: 14-Jul-1941, 70 y.o.   MRN: [id number] SUBJECTIVE:    Is feeling better today.  Atrophic fibrillation rate is under better control.  She is diarrhea seen.      Filed Vitals:   09/19/11 1452 09/19/11 2155 09/20/11 0509 09/20/11 1024  BP: 124/74 101/63 126/79   Pulse:  82 111 123  Temp:  97.6 F (36.4 C) 97.6 F (36.4 C)   TempSrc:  Oral Oral   Resp:  20 19   Height:      Weight:   257 lb 6.4 oz (116.756 kg)   SpO2:  93% 94%     Intake/Output Summary (Last 24 hours) at 09/20/11 1125 Last data filed at 09/20/11 0800  Gross per 24 hour  Intake    720 ml  Output   1650 ml  Net   -930 ml    LABS: Basic Metabolic Panel:  Basename 09/20/11 0500 09/19/11 0520  NA 138 141  K 2.7* 3.3*  CL 96 100  CO2 33* 27  GLUCOSE 153* 155*  BUN 20 19  CREATININE 0.64 0.61  CALCIUM 8.6 9.3  MG -- --  PHOS -- --   Liver Function Tests: No results found for this basename: AST:2,ALT:2,ALKPHOS:2,BILITOT:2,PROT:2,ALBUMIN:2 in the last 72 hours No results found for this basename: LIPASE:2,AMYLASE:2 in the last 72 hours CBC:  Basename 09/19/11 0520  WBC 13.6*  NEUTROABS --  HGB 13.9  HCT 42.4  MCV 82.8  PLT 350   Cardiac Enzymes:  Basename 09/19/11 0520  CKTOTAL --  CKMB --  CKMBINDEX --  TROPONINI <0.30   BNP:  Basename 09/19/11 0734  POCBNP 986.4*   D-Dimer: No results found for this basename: DDIMER:2 in the last 72 hours Hemoglobin A1C: No results found for this basename: HGBA1C in the last 72 hours Fasting Lipid Panel: No results found for this basename: CHOL,HDL,LDLCALC,TRIG,CHOLHDL,LDLDIRECT in the last 72 hours Thyroid Function Tests: No results found for this basename: TSH,T4TOTAL,FREET3,T3FREE,THYROIDAB in the last 72 hours  RADIOLOGY: Dg Chest 2 View  09/19/2011  *RADIOLOGY REPORT*  Clinical Data: Shortness of breath and right neck pain.  CHEST - 2 VIEW  Comparison: Chest radiograph performed 10/14/2010  Findings: The  lungs are well-aerated.  Vascular congestion is noted, with diffusely increased interstitial markings, most compatible with pulmonary edema.  No significant pleural effusion or pneumothorax is seen.  The heart is mildly enlarged; calcification is noted within the aortic arch.  No acute osseous abnormalities are seen.  IMPRESSION: Vascular congestion and mild cardiomegaly, with diffusely increased interstitial markings, most compatible with pulmonary edema.  Original Report Authenticated By: Tonia Ghent, M.D.    PHYSICAL EXAM   Patient is oriented to person time and place.  Affect is normal.  There is no jugular venous distention.  She is significantly overweight.  Lungs reveal a few scattered rhonchi.  Cardiac exam reveals that the rhythm is irregularly irregular.  There is a soft systolic murmur.  There is no significant peripheral edema.  TELEMETRY:  Telemetry as reviewed by me.  There is atrial flutter with a controlled rate.   ASSESSMENT AND PLAN:  Principal Problem:  *Atrial flutter Active Problems:  DM  HYPERLIPIDEMIA  GOUT  OBESITY, MORBID  OBSTRUCTIVE SLEEP APNEA  HYPERTENSION   ATRIAL FIBRILLATION, PAROXYSMAL     Currently the rate is under control.  Meds are being continued.   DIASTOLIC HEART FAILURE, ACUTE ON CHRONIC      The patient his diarrhea seemed.  Meds to be continued. The patient did have an echo yesterday.  She has continued normal systolic left ventricular function.  Hypokalemia      Potassium was 2.7 this morning.  She will receive a total of 120 mEq of potassium today.  Chemistry will be checked tomorrow.  We will then write new orders for her potassium dosing.  [name] 09/20/2011 11:25 AM

## 2011-09-20 NOTE — Plan of Care (Signed)
Problem: Phase I Progression Outcomes Goal: EF % per last Echo/documented,Core Reminder form on chart Outcome: Completed/Met Date Met:  09/20/11 EF 60% 11/30

## 2011-09-20 NOTE — Progress Notes (Signed)
Critical Lab Value: K+ 2.7  Theodore Demark, PA notified and received orders. Arva Chafe 09/20/2011

## 2011-09-21 ENCOUNTER — Encounter (HOSPITAL_COMMUNITY): Payer: Self-pay | Admitting: Cardiology

## 2011-09-21 DIAGNOSIS — M549 Dorsalgia, unspecified: Secondary | ICD-10-CM | POA: Insufficient documentation

## 2011-09-21 LAB — GLUCOSE, CAPILLARY
Glucose-Capillary: 219 mg/dL — ABNORMAL HIGH (ref 70–99)
Glucose-Capillary: 182 mg/dL — ABNORMAL HIGH (ref 70–99)

## 2011-09-21 LAB — BASIC METABOLIC PANEL
Calcium: 8.8 mg/dL (ref 8.4–10.5)
Creatinine, Ser: 0.66 mg/dL (ref 0.50–1.10)
GFR calc Af Amer: >90 mL/min (ref >90)

## 2011-09-21 LAB — PROTIME-INR: Prothrombin Time: 25.4 seconds — ABNORMAL HIGH (ref 11.6–15.2)

## 2011-09-21 MED ORDER — ROSUVASTATIN CALCIUM 10 MG PO TABS
10.0000 mg | ORAL_TABLET | Freq: Every day | ORAL | Status: DC
  Administered 2011-09-22 – 2011-10-04 (×13): 10 mg via ORAL
  Filled 2011-09-21 (×14): qty 1

## 2011-09-21 MED ORDER — POTASSIUM CHLORIDE 20 MEQ PO PACK: 40.0000 meq | PACK | Freq: Once | ORAL | Status: DC

## 2011-09-21 MED ORDER — POTASSIUM CHLORIDE CRYS ER 20 MEQ PO TBCR
40.0000 meq | EXTENDED_RELEASE_TABLET | Freq: Every day | ORAL | Status: DC
  Administered 2011-09-21 – 2011-09-23 (×3): 40 meq via ORAL
  Filled 2011-09-21 (×3): qty 2

## 2011-09-21 MED ORDER — WARFARIN SODIUM 5 MG PO TABS
5.0000 mg | ORAL_TABLET | Freq: Once | ORAL | Status: AC
  Administered 2011-09-21: 5 mg via ORAL
  Filled 2011-09-21: qty 1

## 2011-09-21 NOTE — Progress Notes (Signed)
Patient ID: [name], female   DOB: [birth year], 70 y.o.   MRN: [id number] SUBJECTIVE:   From the cardiac viewpoint the patient's feeling much better.  She is complaining of low back pain.  She tells me that she has seen Dr. Bishop Dublin of general surgery.  It is my understanding there may have been a question of something related to one of her kidneys.  She tells me that he thought that it was probably low back pain.  She tells me that she then saw her primary physician who is arranging for an MRI this coming Wednesday.  These issues can be reviewed with the primary physicians tomorrow and decisions made as to what type of testing the patient should have in the hospital.    The patient continues in atrial flutter.  She had been cardioverted recently.  There is discussion in the H&P about the possibility of RF ablation this admission.  I will discuss this with Dr. Graciela Husbands first thing tomorrow morning.   Filed Vitals:   09/20/11 1024 09/20/11 1400 09/20/11 2037 09/21/11 0455  BP:  121/66 124/79 110/78  Pulse: 123 105 111 101  Temp:  98.3 F (36.8 C) 97.9 F (36.6 C) 98 F (36.7 C)  TempSrc:  Oral Oral Oral  Resp:  20 19 18   Height:      Weight:    256 lb 12.8 oz (116.484 kg)  SpO2:  95% 94% 95%    Intake/Output Summary (Last 24 hours) at 09/21/11 1006 Last data filed at 09/21/11 0746  Gross per 24 hour  Intake    480 ml  Output   1100 ml  Net   -620 ml    LABS: Basic Metabolic Panel:  Basename 09/21/11 0500 09/20/11 0500  NA 137 138  K 3.6 2.7*  CL 96 96  CO2 34* 33*  GLUCOSE 170* 153*  BUN 20 20  CREATININE 0.66 0.64  CALCIUM 8.8 8.6  MG -- --  PHOS -- --   Liver Function Tests: No results found for this basename: AST:2,ALT:2,ALKPHOS:2,BILITOT:2,PROT:2,ALBUMIN:2 in the last 72 hours No results found for this basename: LIPASE:2,AMYLASE:2 in the last 72 hours CBC:  Basename 09/19/11 0520  WBC 13.6*  NEUTROABS --  HGB 13.9  HCT 42.4  MCV 82.8  PLT 350   Cardiac  Enzymes:  Basename 09/19/11 0520  CKTOTAL --  CKMB --  CKMBINDEX --  TROPONINI <0.30   BNP:  Basename 09/19/11 0734  POCBNP 986.4*   D-Dimer: No results found for this basename: DDIMER:2 in the last 72 hours Hemoglobin A1C: No results found for this basename: HGBA1C in the last 72 hours Fasting Lipid Panel: No results found for this basename: CHOL,HDL,LDLCALC,TRIG,CHOLHDL,LDLDIRECT in the last 72 hours Thyroid Function Tests: No results found for this basename: TSH,T4TOTAL,FREET3,T3FREE,THYROIDAB in the last 72 hours  RADIOLOGY: Dg Chest 2 View  09/19/2011  *RADIOLOGY REPORT*  Clinical Data: Shortness of breath and right neck pain.  CHEST - 2 VIEW  Comparison: Chest radiograph performed 10/14/2010  Findings: The lungs are well-aerated.  Vascular congestion is noted, with diffusely increased interstitial markings, most compatible with pulmonary edema.  No significant pleural effusion or pneumothorax is seen.  The heart is mildly enlarged; calcification is noted within the aortic arch.  No acute osseous abnormalities are seen.  IMPRESSION: Vascular congestion and mild cardiomegaly, with diffusely increased interstitial markings, most compatible with pulmonary edema.  Original Report Authenticated By: Tonia Ghent, M.D.    PHYSICAL EXAM     Patient is  oriented to person time and place.  Affect is normal.  She is a little uncomfortable with her back pain.  There is no jugular venous distention.  Lungs are clear.  Respiratory effort is unlabored.  Cardiac exam reveals S1 and S2.  The rhythm is irregular.  The abdomen is soft.  There is no significant peripheral edema.   TELEMETRY:     I have personally reviewed the telemetry.  She has atrial flutter.  The rate is borderline control.   ASSESSMENT AND PLAN:  Principal Problem:  *Atrial flutter      Patient continues in atrial flutter.  There we further discussion about whether or not proceeding with RF ablation is appropriate.  The  patient's INR is therapeutic. Active Problems:  DM  HYPERLIPIDEMIA  GOUT  OBESITY, MORBID  OBSTRUCTIVE SLEEP APNEA  HYPERTENSION  ATRIAL FIBRILLATION, PAROXYSMAL   DIASTOLIC HEART FAILURE, ACUTE ON CHRONIC     Patient's volume status appears to be stable today.  No change in therapy.  Lumbar strain  Hypokalemia     The patient received 120 mEq of potassium yesterday and her potassium is now normal.  Potassium will be checked again tomorrow. She will receive one dose of potassium today.   Ejection fraction   Back pain     Today she is complaining of some back pain.  See discussion above At the beginning of this note.  We will need to be in touch with her primary physician to get more information about the planned MRI for her back.      [name] 09/21/2011 10:06 AM

## 2011-09-21 NOTE — Progress Notes (Signed)
Case Management:   09/21/11 1200 Physician, please order [facility] RN for Heart Failure Home Health Screen and complete Face to Face documentation.  Pt. has chosen [facility] for services.  NCM to follow.  Tera Mater, RN, BSN Case Manager # [phone].

## 2011-09-21 NOTE — Progress Notes (Signed)
ANTICOAGULATION CONSULT NOTE - Follow Up Consult  Pharmacy Consult for Warfarin Indication: Atrial Fibrillation  No Known Allergies  Patient Measurements: Height: 5\' 1"  (154.9 cm) Weight: 256 lb 12.8 oz (116.484 kg) IBW/kg (Calculated) : 47.8    Vital Signs: Temp: 97.7 F (36.5 C) (12/02 1419) Temp src: Oral (12/02 1419) BP: 127/81 mmHg (12/02 1422) Pulse Rate: 101  (12/02 1419)  Labs:  Basename 09/21/11 0500 09/20/11 0500 09/19/11 0520  HGB -- -- 13.9  HCT -- -- 42.4  PLT -- -- 350  APTT -- -- --  LABPROT 25.4* 24.7* 24.7*  INR 2.27* 2.19* 2.19*  HEPARINUNFRC -- -- --  CREATININE 0.66 0.64 0.61  CKTOTAL -- -- --  CKMB -- -- --  TROPONINI -- -- <0.30   Estimated Creatinine Clearance: 81.1 ml/min (by C-G formula based on Cr of 0.66).   Medications:  Prescriptions prior to admission  Medication Sig Dispense Refill  . colchicine (COLCRYS) 0.6 MG tablet Take 0.6 mg by mouth daily as needed. For gout      . diltiazem (TIAZAC) 240 MG 24 hr capsule Take 240 mg by mouth daily.        . flecainide (TAMBOCOR) 100 MG tablet Take 1 tablet (100 mg total) by mouth 2 (two) times daily.  60 tablet  6  . HYDROcodone-acetaminophen (NORCO) 10-325 MG per tablet Take 1 tablet by mouth every 6 (six) hours as needed. For pain      . lisinopril (PRINIVIL,ZESTRIL) 40 MG tablet Take 40 mg by mouth daily.        . metoprolol (LOPRESSOR) 100 MG tablet Take 100 mg by mouth 2 (two) times daily.        . potassium chloride (K-DUR) 10 MEQ tablet       . pravastatin (PRAVACHOL) 80 MG tablet Take 80 mg by mouth daily.       Marland Kitchen triamterene-hydrochlorothiazide (MAXZIDE-25) 37.5-25 MG per tablet Take 1 tablet by mouth daily.        Marland Kitchen warfarin (COUMADIN) 5 MG tablet Take 2.5-5 mg by mouth daily. 1 tablet every day except take 1/2 tablet on wednesdays       Scheduled:     . diltiazem  240 mg Oral Daily  . flecainide  100 mg Oral BID  . furosemide  40 mg Intravenous BID  . influenza  inactive virus  vaccine  0.5 mL Intramuscular Tomorrow-1000  . insulin aspart  0-15 Units Subcutaneous TID WC  . insulin aspart  0-5 Units Subcutaneous QHS  . lisinopril  40 mg Oral Daily  . metoprolol  100 mg Oral BID  . potassium chloride  40 mEq Oral BID  . potassium chloride  40 mEq Oral Daily  . simvastatin  40 mg Oral q1800  . sodium chloride  3 mL Intravenous Q12H  . warfarin  5 mg Oral ONCE-1800  . DISCONTD: potassium chloride  40 mEq Oral Once   Warfarin History: PTA dose was 5 mg daily EXCEPT for 2.5 mg on Wednesdays only 11/30: Warfarin 5 mg (INR 2.19 >> 2.19) 12/1: Warfarin 5 mg (INR 2.19 >> 2.27)  Assessment: 70 y.o. F on warfarin for Afib with a therapeutic INR this a.m. No CBC today. No signs/symptoms of bleeding noted. Pt has been continued on her home dose thus far.  Goal of Therapy:  INR 2-3   Plan:  1. Warfarin 5 mg x 1 dose at 1800 today 2. Will continue to monitor for any signs/symptoms of bleeding and will follow  up with PT/INR in the a.m.   Rolley Sims 09/21/2011,2:54 PM

## 2011-09-22 DIAGNOSIS — I4892 Unspecified atrial flutter: Principal | ICD-10-CM

## 2011-09-22 LAB — BASIC METABOLIC PANEL
BUN: 21 mg/dL (ref 6–23)
Calcium: 8.9 mg/dL (ref 8.4–10.5)
GFR calc non Af Amer: >90 mL/min (ref >90)
Glucose, Bld: 157 mg/dL — ABNORMAL HIGH (ref 70–99)

## 2011-09-22 LAB — GLUCOSE, CAPILLARY: Glucose-Capillary: 168 mg/dL — ABNORMAL HIGH (ref 70–99)

## 2011-09-22 MED ORDER — WARFARIN SODIUM 2.5 MG PO TABS
2.5000 mg | ORAL_TABLET | ORAL | Status: DC
  Administered 2011-09-24: 2.5 mg via ORAL
  Filled 2011-09-22: qty 1

## 2011-09-22 MED ORDER — COUMADIN BOOK
Freq: Once | Status: AC
  Administered 2011-09-22: 18:00:00
  Filled 2011-09-22: qty 1

## 2011-09-22 MED ORDER — WARFARIN SODIUM 5 MG PO TABS
5.0000 mg | ORAL_TABLET | ORAL | Status: DC
  Administered 2011-09-22 – 2011-09-23 (×2): 5 mg via ORAL
  Filled 2011-09-22 (×3): qty 1

## 2011-09-22 MED ORDER — WARFARIN VIDEO
Freq: Once | Status: AC
  Administered 2011-09-22: 18:00:00

## 2011-09-22 NOTE — Progress Notes (Signed)
ANTICOAGULATION CONSULT NOTE - Follow Up Consult  Pharmacy Consult for Warfarin Indication: Atrial Fibrillation  No Known Allergies  Patient Measurements: Height: 5\' 1"  (154.9 cm) Weight: 260 lb 5.8 oz (118.1 kg) IBW/kg (Calculated) : 47.8    Vital Signs: Temp: 98.7 F (37.1 C) (12/03 0422) Temp src: Oral (12/03 0422) BP: 131/91 mmHg (12/03 0422) Pulse Rate: 99  (12/03 0422)  Labs:  Basename 09/22/11 0700 09/21/11 0500 09/20/11 0500  HGB -- -- --  HCT -- -- --  PLT -- -- --  APTT -- -- --  LABPROT 25.9* 25.4* 24.7*  INR 2.32* 2.27* 2.19*  HEPARINUNFRC -- -- --  CREATININE 0.62 0.66 0.64  CKTOTAL -- -- --  CKMB -- -- --  TROPONINI -- -- --   Estimated Creatinine Clearance: 81.8 ml/min (by C-G formula based on Cr of 0.62).   Medications:  Prescriptions prior to admission  Medication Sig Dispense Refill  . colchicine (COLCRYS) 0.6 MG tablet Take 0.6 mg by mouth daily as needed. For gout      . diltiazem (TIAZAC) 240 MG 24 hr capsule Take 240 mg by mouth daily.        . flecainide (TAMBOCOR) 100 MG tablet Take 1 tablet (100 mg total) by mouth 2 (two) times daily.  60 tablet  6  . HYDROcodone-acetaminophen (NORCO) 10-325 MG per tablet Take 1 tablet by mouth every 6 (six) hours as needed. For pain      . lisinopril (PRINIVIL,ZESTRIL) 40 MG tablet Take 40 mg by mouth daily.        . metoprolol (LOPRESSOR) 100 MG tablet Take 100 mg by mouth 2 (two) times daily.        . potassium chloride (K-DUR) 10 MEQ tablet       . pravastatin (PRAVACHOL) 80 MG tablet Take 80 mg by mouth daily.       Marland Kitchen triamterene-hydrochlorothiazide (MAXZIDE-25) 37.5-25 MG per tablet Take 1 tablet by mouth daily.        Marland Kitchen warfarin (COUMADIN) 5 MG tablet Take 2.5-5 mg by mouth daily. 1 tablet every day except take 1/2 tablet on wednesdays       Scheduled:     . diltiazem  240 mg Oral Daily  . flecainide  100 mg Oral BID  . furosemide  40 mg Intravenous BID  . insulin aspart  0-15 Units  Subcutaneous TID WC  . insulin aspart  0-5 Units Subcutaneous QHS  . lisinopril  40 mg Oral Daily  . metoprolol  100 mg Oral BID  . potassium chloride  40 mEq Oral Daily  . rosuvastatin  10 mg Oral q1800  . sodium chloride  3 mL Intravenous Q12H  . warfarin  2.5 mg Oral Q Wed-1800  . warfarin  5 mg Oral ONCE-1800  . warfarin  5 mg Oral Custom  . DISCONTD: simvastatin  40 mg Oral q1800   Warfarin History: PTA dose was 5 mg daily EXCEPT for 2.5 mg on Wednesdays only  Assessment: 70 y.o. F on warfarin for Afib with a therapeutic INR this a.m. No CBC today. No signs/symptoms of bleeding noted. Pt has been continued on her home dose thus far.  Goal of Therapy:  INR 2-3   Plan:  1. Coumadin 5 mg daily except 2.5 mg Wed 2. Will continue to monitor for any signs/symptoms of bleeding and will follow up with PT/INR in the a.m.   [address], Logyn Dedominicis Danielle 09/22/2011,2:16 PM

## 2011-09-22 NOTE — Progress Notes (Signed)
UR Completed.   Jackie N Alecsander Hattabaugh 09/22/2011 [phone]  

## 2011-09-22 NOTE — Progress Notes (Signed)
Inpatient Diabetes Program Recommendations  AACE/ADA: New Consensus Statement on Inpatient Glycemic Control (2009)  Target Ranges:  Prepandial:   less than 140 mg/dL      Peak postprandial:   less than 180 mg/dL (1-2 hours)      Critically ill patients:  140 - 180 mg/dL   Reason for Visit: No current HgBA1C  Inpatient Diabetes Program Recommendations HgbA1C: Please check.  Last documented value at 7.8% in January of 2012.  Note: Pt may need a medication added to regimen to control diabetes and/or current education. Overton Mam

## 2011-09-22 NOTE — Progress Notes (Signed)
Patient ID: [name], female   DOB: Sep 19, 1941, 70 y.o.   MRN: [id number] Subjective:  C/o right side pain. Worse today. Present over a year. Minimal palpitations.  Objective:  Vital Signs in the last 24 hours: Temp:  [97 F (36.1 C)-98.7 F (37.1 C)] 97 F (36.1 C) (12/03 1420) Pulse Rate:  [95-113] 95  (12/03 1420) Resp:  [18-19] 19  (12/03 1420) BP: (108-146)/(69-91) 108/69 mmHg (12/03 1420) SpO2:  [94 %-96 %] 96 % (12/03 1420) Weight:  [118.1 kg (260 lb 5.8 oz)] 260 lb 5.8 oz (118.1 kg) (12/03 0422)  Intake/Output from previous day: 12/02 0701 - 12/03 0700 In: -  Out: 100 [Urine:100] Intake/Output from this shift: Total I/O In: 480 [P.O.:480] Out: 1001 [Urine:1000; Stool:1]  Physical Exam: Morbidly obese appearing middle aged woman, NAD HEENT: Unremarkable Neck:  No JVD, no thyromegally Lymphatics:  No adenopathy Back:  No CVA tenderness. Tender to palp on right side. No masses appreciated. Lungs:  Clear with rales in bases HEART:  Regular tachy rhythm with distant heart sounds. Abd:  Obese, positive bowel sounds, no organomegally, no rebound, no guarding Ext:  2 plus pulses, 1+ edema bilaterally, no cyanosis, no clubbing Skin:  No rashes no nodules Neuro:  CN II through XII intact, motor grossly intact  Lab Results: No results found for this basename: WBC:2,HGB:2,PLT:2 in the last 72 hours  Basename 09/22/11 0700 09/21/11 0500  NA 140 137  K 3.3* 3.6  CL 97 96  CO2 33* 34*  GLUCOSE 157* 170*  BUN 21 20  CREATININE 0.62 0.66   No results found for this basename: TROPONINI:2,CK,MB:2 in the last 72 hours Hepatic Function Panel No results found for this basename: PROT,ALBUMIN,AST,ALT,ALKPHOS,BILITOT,BILIDIR,IBILI in the last 72 hours No results found for this basename: CHOL in the last 72 hours No results found for this basename: PROTIME in the last 72 hours  Imaging: No results found.  Cardiac Studies: Atrial flutter with 2:1 AV  conduction Assessment/Plan:  1. Recurrent atrial flutter - she is s/p DCCV with ERAF. Her flutter appears to be ablatable. She has been on therapeutic anti-coag. She is amenable to ablation. Her obesity will make the procedure more difficult. Will try to attempt procedure with anesthesia.  2. Right side pain - etiology is unclear. Chronic.  LOS: 3 days    Lewayne Bunting 09/22/2011, 2:40 PM

## 2011-09-23 ENCOUNTER — Inpatient Hospital Stay (HOSPITAL_COMMUNITY): Payer: Medicare Other

## 2011-09-23 DIAGNOSIS — I4892 Unspecified atrial flutter: Secondary | ICD-10-CM

## 2011-09-23 DIAGNOSIS — J90 Pleural effusion, not elsewhere classified: Secondary | ICD-10-CM

## 2011-09-23 LAB — GLUCOSE, CAPILLARY
Glucose-Capillary: 178 mg/dL — ABNORMAL HIGH (ref 70–99)
Glucose-Capillary: 140 mg/dL — ABNORMAL HIGH (ref 70–99)

## 2011-09-23 LAB — PROTIME-INR: Prothrombin Time: 25.7 seconds — ABNORMAL HIGH (ref 11.6–15.2)

## 2011-09-23 MED ORDER — SODIUM CHLORIDE 0.9 % IV SOLN
INTRAVENOUS | Status: DC
  Administered 2011-09-24: 06:00:00 via INTRAVENOUS

## 2011-09-23 MED ORDER — POTASSIUM CHLORIDE CRYS ER 20 MEQ PO TBCR
40.0000 meq | EXTENDED_RELEASE_TABLET | Freq: Three times a day (TID) | ORAL | Status: AC
  Administered 2011-09-23 – 2011-09-24 (×6): 40 meq via ORAL
  Filled 2011-09-23 (×6): qty 2

## 2011-09-23 MED ORDER — FUROSEMIDE 10 MG/ML IJ SOLN
60.0000 mg | Freq: Two times a day (BID) | INTRAMUSCULAR | Status: DC
  Administered 2011-09-23: 60 mg via INTRAVENOUS
  Filled 2011-09-23 (×2): qty 6

## 2011-09-23 NOTE — Progress Notes (Addendum)
Patient Name: [name]    ASSESSMENT AND PLAN:    Patient Active Hospital Problem List: Atrial flutter (09/12/2011)   Assessment  the patient has recurrent atrial arrhythmia now manifesting as sustained atrial flutter. Options include arrhythmic therapy changes versus catheter ablation VT and flecainide. Catheter ablation is not a trivial undertaking in this morbidly obese woman. She also significant back pain. These would require concomitant use of general anesthesia. It is a reasonable undertaking however given the problems that she has had with her arrhythmia. We have discussed the fact that we'll not likely prevent long-term recurrence of atrial fibrillation. She is agreeable to proceeding.:*   Plan  we will review the schedule as you can do about proceeding with catheter ablation. (SEE BELOW)    DIASTOLIC HEART FAILURE, ACUTE ON CHRONIC (02/20/2010)   Assessment:  This is much improved. He remains however shorter breath with minimal exertion.this is likely related to her flutter at least in part. She also has evidence of a pleural effusion.   Plan: Continue diuretics, check a chest x-ray for consideration of thoracentesis although the likelihood that somebody will do this with a therapeutic INR is 3 small     HYPERTENSION (05/16/2009)   Assessment: Reasonably    Plan: Continue current medications  ATRIAL FIBRILLATION, PAROXYSMAL (05/16/2009)   Assessment: As above    Plan: As above  Lumbar strain (08/19/2011)   Assessment: She has an MRI scheduled as an outpatient tomorrow. I will try to help make sure she gets to that.    Plan: June the tympanic medications  Hypokalemia (09/19/2011)   Assessment: On last  assessment K was still 3.3    Plan: Further repletion is indicated   Plerual effusion will check cxr but dont think he is candiate for thoracentesis       SUBJECTIVE: Patient is still short of breath with modest exertion. Palpitations are largely gone. Back pain is still  a major issue.     PHYSICAL EXAM Filed Vitals:   09/22/11 0422 09/22/11 1420 09/22/11 2114 09/23/11 0535  BP: 131/91 108/69 106/70 123/75  Pulse: 99 95 89 106  Temp: 98.7 F (37.1 C) 97 F (36.1 C) 98.4 F (36.9 C) 98.4 F (36.9 C)  TempSrc: Oral Oral Oral Oral  Resp: 18 19 20 18   Height:      Weight: 260 lb 5.8 oz (118.1 kg)   257 lb (116.574 kg)  SpO2: 94% 96% 95% 96%    General appearance: alert, cooperative, no distress and morbidly obese Neck: JVD - 9 cm above sternal notch and no carotid bruit Lungs: diminished breath sounds posterior - right Heart: Rate is regular but rapid no significant murmurs or appreciate Abdomen: Morbidly obese but soft Extremities: no edema, redness or tenderness in the calves or thighs Pulses: 2+ and symmetric Skin: Skin color, texture, turgor normal. No rashes or lesions  TELEMETRY: Reviewed telemetry pt in atrial flutter mostly to 1:    Intake/Output Summary (Last 24 hours) at 09/23/11 1058 Last data filed at 09/23/11 0830  Gross per 24 hour  Intake   1440 ml  Output   1701 ml  Net   -261 ml    LABS: Basic Metabolic Panel:  Lab 09/22/11 1610 09/21/11 0500 09/20/11 0500 09/19/11 0520  NA 140 137 138 141  K 3.3* 3.6 2.7* 3.3*  CL 97 96 96 100  CO2 33* 34* 33* 27  GLUCOSE 157* 170* 153* 155*  BUN 21 20 20 19   CREATININE 0.62 0.66  0.64 0.61  CALCIUM 8.9 8.8 -- --  MG -- -- -- --  PHOS -- -- -- --   Cardiac Enzymes: No results found for this basename: CKTOTAL:3,CKMB:3,CKMBINDEX:3,TROPONINI:3 in the last 72 hours CBC:  Lab 09/19/11 0520  WBC 13.6*  NEUTROABS --  HGB 13.9  HCT 42.4  MCV 82.8  PLT 350      Signed, Sherryl Manges MD  09/23/2011   Have spoken with anesthesia and DR Ladona Ridgel,  CXR much improved  Will proceed with RFCA in am

## 2011-09-23 NOTE — Progress Notes (Signed)
[facility] chronic disease management service consultation requested by the heart failure team. [facility] will initiate services within 72 hours of discharge.  I have contact the [facility] hospital liaison regarding our service presence.  There has been some discussion to clarify that this is not a duplication of services with home health care.  It has been discussed with the patient that [facility] services are long term and comprehensive whereas traditional home health services address acute issues short term and require a homebound status.

## 2011-09-23 NOTE — Progress Notes (Signed)
ANTICOAGULATION CONSULT NOTE - Follow Up Consult  Pharmacy Consult for Warfarin Indication: Atrial Fibrillation  No Known Allergies  Patient Measurements: Height: 5\' 1"  (154.9 cm) Weight: 257 lb (116.574 kg) IBW/kg (Calculated) : 47.8    Vital Signs: Temp: 98.4 F (36.9 C) (12/04 0535) Temp src: Oral (12/04 0535) BP: 123/75 mmHg (12/04 0535) Pulse Rate: 106  (12/04 0535)  Labs:  Basename 09/23/11 0518 09/22/11 0700 09/21/11 0500  HGB -- -- --  HCT -- -- --  PLT -- -- --  APTT -- -- --  LABPROT 25.7* 25.9* 25.4*  INR 2.30* 2.32* 2.27*  HEPARINUNFRC -- -- --  CREATININE -- 0.62 0.66  CKTOTAL -- -- --  CKMB -- -- --  TROPONINI -- -- --   Estimated Creatinine Clearance: 81.1 ml/min (by C-G formula based on Cr of 0.62).   Medications:  Prescriptions prior to admission  Medication Sig Dispense Refill  . colchicine (COLCRYS) 0.6 MG tablet Take 0.6 mg by mouth daily as needed. For gout      . diltiazem (TIAZAC) 240 MG 24 hr capsule Take 240 mg by mouth daily.        . flecainide (TAMBOCOR) 100 MG tablet Take 1 tablet (100 mg total) by mouth 2 (two) times daily.  60 tablet  6  . HYDROcodone-acetaminophen (NORCO) 10-325 MG per tablet Take 1 tablet by mouth every 6 (six) hours as needed. For pain      . lisinopril (PRINIVIL,ZESTRIL) 40 MG tablet Take 40 mg by mouth daily.        . metoprolol (LOPRESSOR) 100 MG tablet Take 100 mg by mouth 2 (two) times daily.        . potassium chloride (K-DUR) 10 MEQ tablet       . pravastatin (PRAVACHOL) 80 MG tablet Take 80 mg by mouth daily.       Marland Kitchen triamterene-hydrochlorothiazide (MAXZIDE-25) 37.5-25 MG per tablet Take 1 tablet by mouth daily.        Marland Kitchen warfarin (COUMADIN) 5 MG tablet Take 2.5-5 mg by mouth daily. 1 tablet every day except take 1/2 tablet on wednesdays       Scheduled:     . coumadin book   Does not apply Once  . diltiazem  240 mg Oral Daily  . flecainide  100 mg Oral BID  . furosemide  60 mg Intravenous BID  .  insulin aspart  0-15 Units Subcutaneous TID WC  . insulin aspart  0-5 Units Subcutaneous QHS  . lisinopril  40 mg Oral Daily  . metoprolol  100 mg Oral BID  . potassium chloride  40 mEq Oral TID  . rosuvastatin  10 mg Oral q1800  . sodium chloride  3 mL Intravenous Q12H  . warfarin  2.5 mg Oral Q Wed-1800  . warfarin  5 mg Oral Custom  . warfarin   Does not apply Once  . DISCONTD: furosemide  40 mg Intravenous BID  . DISCONTD: potassium chloride  40 mEq Oral Daily   Warfarin History: PTA dose was 5 mg daily EXCEPT for 2.5 mg on Wednesdays only  Assessment: 70 y.o. F on warfarin for Afib with a therapeutic INR this a.m. No CBC today. No signs/symptoms of bleeding noted. Pt has been continued on her home dose thus far.  Goal of Therapy:  INR 2-3   Plan:  1. Coumadin 5 mg daily except 2.5 mg Wed 2. Will continue to monitor for any signs/symptoms of bleeding and will follow up with PT/INR  in the am 3. CBC in the am  [facility] 09/23/2011,11:22 AM

## 2011-09-24 ENCOUNTER — Inpatient Hospital Stay (HOSPITAL_COMMUNITY): Admission: RE | Admit: 2011-09-24 | Payer: Medicare Other | Source: Ambulatory Visit

## 2011-09-24 ENCOUNTER — Encounter (HOSPITAL_COMMUNITY): Payer: Self-pay | Admitting: Anesthesiology

## 2011-09-24 ENCOUNTER — Other Ambulatory Visit: Payer: Self-pay

## 2011-09-24 ENCOUNTER — Encounter (HOSPITAL_COMMUNITY)
Admission: EM | Disposition: A | Discharge status: Home Health | Payer: Self-pay | Source: Home / Self Care | Attending: Internal Medicine

## 2011-09-24 ENCOUNTER — Ambulatory Visit (HOSPITAL_COMMUNITY): Admit: 2011-09-24 | Payer: Self-pay | Admitting: Internal Medicine

## 2011-09-24 ENCOUNTER — Inpatient Hospital Stay (HOSPITAL_COMMUNITY): Payer: Medicare Other | Admitting: Anesthesiology

## 2011-09-24 DIAGNOSIS — I4892 Unspecified atrial flutter: Secondary | ICD-10-CM

## 2011-09-24 HISTORY — PX: ATRIAL FLUTTER ABLATION: SHX5733

## 2011-09-24 LAB — GLUCOSE, CAPILLARY: Glucose-Capillary: 165 mg/dL — ABNORMAL HIGH (ref 70–99)

## 2011-09-24 LAB — CBC
HCT: 42.5 % (ref 36.0–46.0)
Hemoglobin: 13.8 g/dL (ref 12.0–15.0)
MCV: 83.7 fL (ref 78.0–100.0)
WBC: 12.4 10*3/uL — ABNORMAL HIGH (ref 4.0–10.5)

## 2011-09-24 LAB — BASIC METABOLIC PANEL
CO2: 28 mEq/L (ref 19–32)
Chloride: 102 mEq/L (ref 96–112)
Glucose, Bld: 178 mg/dL — ABNORMAL HIGH (ref 70–99)
Sodium: 141 mEq/L (ref 135–145)

## 2011-09-24 LAB — PRO B NATRIURETIC PEPTIDE: Pro B Natriuretic peptide (BNP): 607.9 pg/mL — ABNORMAL HIGH (ref 0–125)

## 2011-09-24 LAB — PROTIME-INR: INR: 2.03 — ABNORMAL HIGH (ref 0.00–1.49)

## 2011-09-24 SURGERY — ATRIAL FLUTTER ABLATION
Anesthesia: General

## 2011-09-24 MED ORDER — ONDANSETRON HCL 4 MG/2ML IJ SOLN: 4.0000 mg | Freq: Once | INTRAMUSCULAR | Status: DC | PRN

## 2011-09-24 MED ORDER — ACETAMINOPHEN 325 MG PO TABS: 650.0000 mg | ORAL_TABLET | ORAL | Status: DC | PRN

## 2011-09-24 MED ORDER — HYDROMORPHONE HCL PF 1 MG/ML IJ SOLN: 0.2500 mg | INTRAMUSCULAR | Status: DC | PRN

## 2011-09-24 MED ORDER — GLYCOPYRROLATE 0.2 MG/ML IJ SOLN
INTRAMUSCULAR | Status: DC | PRN
  Administered 2011-09-24: .8 mg via INTRAVENOUS

## 2011-09-24 MED ORDER — BUPIVACAINE HCL (PF) 0.25 % IJ SOLN
INTRAMUSCULAR | Status: AC
  Filled 2011-09-24: qty 30

## 2011-09-24 MED ORDER — SODIUM CHLORIDE 0.9 % IV SOLN
INTRAVENOUS | Status: AC
  Administered 2011-09-24: 13:00:00 via INTRAVENOUS

## 2011-09-24 MED ORDER — ONDANSETRON HCL 4 MG/2ML IJ SOLN: 4.0000 mg | Freq: Four times a day (QID) | INTRAMUSCULAR | Status: DC | PRN

## 2011-09-24 MED ORDER — ROCURONIUM BROMIDE 100 MG/10ML IV SOLN
INTRAVENOUS | Status: DC | PRN
  Administered 2011-09-24: 50 mg via INTRAVENOUS

## 2011-09-24 MED ORDER — PHENYLEPHRINE HCL 10 MG/ML IJ SOLN
INTRAMUSCULAR | Status: DC | PRN
  Administered 2011-09-24 (×2): 40 ug via INTRAVENOUS
  Administered 2011-09-24: 80 ug via INTRAVENOUS

## 2011-09-24 MED ORDER — LACTATED RINGERS IV SOLN
INTRAVENOUS | Status: DC | PRN
  Administered 2011-09-24 (×2): via INTRAVENOUS

## 2011-09-24 MED ORDER — PROPOFOL 10 MG/ML IV EMUL
INTRAVENOUS | Status: DC | PRN
  Administered 2011-09-24: 200 mg via INTRAVENOUS

## 2011-09-24 MED ORDER — ONDANSETRON HCL 4 MG/2ML IJ SOLN
INTRAMUSCULAR | Status: DC | PRN
  Administered 2011-09-24: 4 mg via INTRAVENOUS

## 2011-09-24 MED ORDER — NEOSTIGMINE METHYLSULFATE 1 MG/ML IJ SOLN
INTRAMUSCULAR | Status: DC | PRN
  Administered 2011-09-24: 4 mg via INTRAVENOUS

## 2011-09-24 MED ORDER — ALPRAZOLAM 0.25 MG PO TABS
0.2500 mg | ORAL_TABLET | Freq: Two times a day (BID) | ORAL | Status: DC | PRN
  Administered 2011-09-24: 0.25 mg via ORAL
  Filled 2011-09-24: qty 1

## 2011-09-24 MED ORDER — METOPROLOL TARTRATE 50 MG PO TABS
50.0000 mg | ORAL_TABLET | Freq: Two times a day (BID) | ORAL | Status: DC
  Administered 2011-09-24 – 2011-09-26 (×5): 50 mg via ORAL
  Filled 2011-09-24 (×7): qty 1

## 2011-09-24 MED ORDER — LIDOCAINE HCL (CARDIAC) 20 MG/ML IV SOLN
INTRAVENOUS | Status: DC | PRN
  Administered 2011-09-24: 50 mg via INTRAVENOUS

## 2011-09-24 NOTE — Interval H&P Note (Signed)
History and Physical Interval Note:  09/24/2011 7:41 AM  [name]  has presented today for surgery, with the diagnosis of Flutter  The various methods of treatment have been discussed with the patient and family. After consideration of risks, benefits and other options for treatment, the patient has consented to  Procedure(s): ATRIAL FLUTTER ABLATION as a surgical intervention .  The patients' history has been reviewed, patient examined, no change in status, stable for surgery.  I have reviewed the patients' chart and labs.  Questions were answered to the patient's satisfaction.     Lewayne Bunting

## 2011-09-24 NOTE — Preoperative (Signed)
Beta Blockers   Reason not to administer Beta Blockers:Not Applicable. Lopressor 100mg  @2200  09/23/11

## 2011-09-24 NOTE — Op Note (Signed)
EPS/RFA of atrial flutter performed without immediate complication. Se dictated note for details. [id number]

## 2011-09-24 NOTE — Anesthesia Postprocedure Evaluation (Signed)
  Anesthesia Post-op Note  Patient: Psychiatric nurse  Procedure(s) Performed:  ATRIAL FLUTTER ABLATION  Patient Location: PACU and Cath Lab  Anesthesia Type: General  Level of Consciousness: sedated and patient cooperative  Airway and Oxygen Therapy: Patient Spontanous Breathing and Patient connected to nasal cannula oxygen  Post-op Pain: none  Post-op Assessment: Post-op Vital signs reviewed, Patient's Cardiovascular Status Stable, Respiratory Function Stable, Patent Airway and No signs of Nausea or vomiting  Post-op Vital Signs: stable  Complications: No apparent anesthesia complications

## 2011-09-24 NOTE — Transfer of Care (Signed)
Immediate Anesthesia Transfer of Care Note  Patient: [name]  Procedure(s) Performed:  ATRIAL FLUTTER ABLATION  Patient Location: PACU  Anesthesia Type: General  Level of Consciousness: awake and sedated  Airway & Oxygen Therapy: Patient Spontanous Breathing and Patient connected to nasal cannula oxygen  Post-op Assessment: Report given to PACU RN and Post -op Vital signs reviewed and stable  Post vital signs: Reviewed and stable  Complications: No apparent anesthesia complications

## 2011-09-24 NOTE — H&P (View-Only) (Signed)
Patient ID: [name], female   DOB: 10/20/1940, 69 y.o.   MRN: [id number] Subjective:  C/o right side pain. Worse today. Present over a year. Minimal palpitations.  Objective:  Vital Signs in the last 24 hours: Temp:  [97 F (36.1 C)-98.7 F (37.1 C)] 97 F (36.1 C) (12/03 1420) Pulse Rate:  [95-113] 95  (12/03 1420) Resp:  [18-19] 19  (12/03 1420) BP: (108-146)/(69-91) 108/69 mmHg (12/03 1420) SpO2:  [94 %-96 %] 96 % (12/03 1420) Weight:  [118.1 kg (260 lb 5.8 oz)] 260 lb 5.8 oz (118.1 kg) (12/03 0422)  Intake/Output from previous day: 12/02 0701 - 12/03 0700 In: -  Out: 100 [Urine:100] Intake/Output from this shift: Total I/O In: 480 [P.O.:480] Out: 1001 [Urine:1000; Stool:1]  Physical Exam: Morbidly obese appearing middle aged woman, NAD HEENT: Unremarkable Neck:  No JVD, no thyromegally Lymphatics:  No adenopathy Back:  No CVA tenderness. Tender to palp on right side. No masses appreciated. Lungs:  Clear with rales in bases HEART:  Regular tachy rhythm with distant heart sounds. Abd:  Obese, positive bowel sounds, no organomegally, no rebound, no guarding Ext:  2 plus pulses, 1+ edema bilaterally, no cyanosis, no clubbing Skin:  No rashes no nodules Neuro:  CN II through XII intact, motor grossly intact  Lab Results: No results found for this basename: WBC:2,HGB:2,PLT:2 in the last 72 hours  Basename 09/22/11 0700 09/21/11 0500  NA 140 137  K 3.3* 3.6  CL 97 96  CO2 33* 34*  GLUCOSE 157* 170*  BUN 21 20  CREATININE 0.62 0.66   No results found for this basename: TROPONINI:2,CK,MB:2 in the last 72 hours Hepatic Function Panel No results found for this basename: PROT,ALBUMIN,AST,ALT,ALKPHOS,BILITOT,BILIDIR,IBILI in the last 72 hours No results found for this basename: CHOL in the last 72 hours No results found for this basename: PROTIME in the last 72 hours  Imaging: No results found.  Cardiac Studies: Atrial flutter with 2:1 AV  conduction Assessment/Plan:  1. Recurrent atrial flutter - she is s/p DCCV with ERAF. Her flutter appears to be ablatable. She has been on therapeutic anti-coag. She is amenable to ablation. Her obesity will make the procedure more difficult. Will try to attempt procedure with anesthesia.  2. Right side pain - etiology is unclear. Chronic.  LOS: 3 days    [name] 09/22/2011, 2:40 PM     

## 2011-09-24 NOTE — Anesthesia Preprocedure Evaluation (Addendum)
Anesthesia Evaluation  Patient identified by MRN, date of birth, ID band Patient awake    Reviewed: Allergy & Precautions, H&P , NPO status , Patient's Chart, lab work & pertinent test results, reviewed documented beta blocker date and time   Airway Mallampati: II TM Distance: >3 FB Neck ROM: full    Dental  (+) Teeth Intact   Pulmonary sleep apnea, Continuous Positive Airway Pressure Ventilation and Oxygen sleep apnea ,    Pulmonary exam normal       Cardiovascular hypertension, + Cardiac Stents Atrial Fibrillation irregular Normal    Neuro/Psych    GI/Hepatic negative GI ROS, Neg liver ROS,   Endo/Other  Diabetes mellitus-  Renal/GU negative Renal ROS  Genitourinary negative   Musculoskeletal  (+) Arthritis -,   Abdominal (+) obese,   Peds  Hematology negative hematology ROS (+)   Anesthesia Other Findings   Reproductive/Obstetrics                          Anesthesia Physical Anesthesia Plan  ASA: III  Anesthesia Plan: General   Post-op Pain Management:    Induction: Intravenous  Airway Management Planned: Oral ETT  Additional Equipment:   Intra-op Plan:   Post-operative Plan: Extubation in OR  Informed Consent: I have reviewed the patients History and Physical, chart, labs and discussed the procedure including the risks, benefits and alternatives for the proposed anesthesia with the patient or authorized representative who has indicated his/her understanding and acceptance.   Dental advisory given  Plan Discussed with: Anesthesiologist, CRNA and Surgeon  Anesthesia Plan Comments:        Anesthesia Quick Evaluation

## 2011-09-24 NOTE — Anesthesia Procedure Notes (Signed)
Procedure Name: Intubation Date/Time: 09/24/2011 9:07 AM Performed by: Margaree Mackintosh Pre-anesthesia Checklist: Patient identified, Patient being monitored, Emergency Drugs available, Suction available and Timeout performed Patient Re-evaluated:Patient Re-evaluated prior to inductionOxygen Delivery Method: Circle System Utilized Preoxygenation: Pre-oxygenation with 100% oxygen Intubation Type: IV induction Ventilation: Mask ventilation without difficulty Laryngoscope Size: Mac and 3 Grade View: Grade II Tube type: Oral Tube size: 7.5 mm Number of attempts: 1 Airway Equipment and Method: stylet Placement Confirmation: positive ETCO2,  ETT inserted through vocal cords under direct vision and breath sounds checked- equal and bilateral Secured at: 21 cm Tube secured with: Tape Dental Injury: Teeth and Oropharynx as per pre-operative assessment

## 2011-09-25 ENCOUNTER — Encounter: Payer: Self-pay | Admitting: Internal Medicine

## 2011-09-25 ENCOUNTER — Other Ambulatory Visit: Payer: Self-pay

## 2011-09-25 ENCOUNTER — Inpatient Hospital Stay (HOSPITAL_COMMUNITY): Payer: Medicare Other

## 2011-09-25 DIAGNOSIS — I5033 Acute on chronic diastolic (congestive) heart failure: Secondary | ICD-10-CM

## 2011-09-25 LAB — BASIC METABOLIC PANEL
CO2: 24 mEq/L (ref 19–32)
CO2: 26 mEq/L (ref 19–32)
Calcium: 9.2 mg/dL (ref 8.4–10.5)
Chloride: 100 mEq/L (ref 96–112)
Chloride: 101 mEq/L (ref 96–112)
Creatinine, Ser: 0.74 mg/dL (ref 0.50–1.10)
GFR calc Af Amer: >90 mL/min (ref >90)
Glucose, Bld: 260 mg/dL — ABNORMAL HIGH (ref 70–99)
Potassium: 3.8 mEq/L (ref 3.5–5.1)
Sodium: 135 mEq/L (ref 135–145)

## 2011-09-25 LAB — CARDIAC PANEL(CRET KIN+CKTOT+MB+TROPI)
CK, MB: 4.5 ng/mL — ABNORMAL HIGH (ref 0.3–4.0)
Relative Index: 3.7 — ABNORMAL HIGH (ref 0.0–2.5)
Total CK: 123 U/L (ref 7–177)
Troponin I: 1.51 ng/mL (ref <0.30)

## 2011-09-25 LAB — URINALYSIS, ROUTINE W REFLEX MICROSCOPIC
Bilirubin Urine: NEGATIVE
Glucose, UA: NEGATIVE mg/dL
Specific Gravity, Urine: 1.022 (ref 1.005–1.030)
Urobilinogen, UA: 1 mg/dL (ref 0.0–1.0)
pH: 6 (ref 5.0–8.0)

## 2011-09-25 LAB — URINE MICROSCOPIC-ADD ON

## 2011-09-25 LAB — CBC
HCT: 42.5 % (ref 36.0–46.0)
MCHC: 32 g/dL (ref 30.0–36.0)
Platelets: 333 10*3/uL (ref 150–400)
RDW: 15.4 % (ref 11.5–15.5)

## 2011-09-25 LAB — GLUCOSE, CAPILLARY
Glucose-Capillary: 195 mg/dL — ABNORMAL HIGH (ref 70–99)
Glucose-Capillary: 128 mg/dL — ABNORMAL HIGH (ref 70–99)

## 2011-09-25 LAB — PRO B NATRIURETIC PEPTIDE: Pro B Natriuretic peptide (BNP): 609.2 pg/mL — ABNORMAL HIGH (ref 0–125)

## 2011-09-25 LAB — PROTIME-INR: Prothrombin Time: 24.1 seconds — ABNORMAL HIGH (ref 11.6–15.2)

## 2011-09-25 MED ORDER — DM-GUAIFENESIN ER 30-600 MG PO TB12
1.0000 | ORAL_TABLET | Freq: Two times a day (BID) | ORAL | Status: DC
  Administered 2011-09-25 – 2011-09-29 (×9): 1 via ORAL
  Filled 2011-09-25 (×11): qty 1

## 2011-09-25 MED ORDER — PIPERACILLIN-TAZOBACTAM 3.375 G IVPB 30 MIN
3.3750 g | Freq: Three times a day (TID) | INTRAVENOUS | Status: DC
  Administered 2011-09-25 – 2011-09-26 (×4): 3.375 g via INTRAVENOUS
  Filled 2011-09-25 (×6): qty 50

## 2011-09-25 MED ORDER — WHITE PETROLATUM GEL
Status: AC
  Administered 2011-09-25: 18:00:00
  Filled 2011-09-25: qty 5

## 2011-09-25 MED ORDER — FUROSEMIDE 10 MG/ML IJ SOLN
INTRAMUSCULAR | Status: AC
  Administered 2011-09-25: 80 mg via INTRAVENOUS
  Filled 2011-09-25: qty 8

## 2011-09-25 MED ORDER — FUROSEMIDE 10 MG/ML IJ SOLN
80.0000 mg | Freq: Once | INTRAMUSCULAR | Status: AC
  Administered 2011-09-25: 80 mg via INTRAVENOUS

## 2011-09-25 MED ORDER — VANCOMYCIN HCL IN DEXTROSE 1-5 GM/200ML-% IV SOLN
1000.0000 mg | Freq: Two times a day (BID) | INTRAVENOUS | Status: DC
  Administered 2011-09-25 – 2011-09-26 (×2): 1000 mg via INTRAVENOUS
  Filled 2011-09-25 (×3): qty 200

## 2011-09-25 MED ORDER — FUROSEMIDE 10 MG/ML IJ SOLN
80.0000 mg | Freq: Two times a day (BID) | INTRAMUSCULAR | Status: DC
  Administered 2011-09-25: 80 mg via INTRAVENOUS
  Filled 2011-09-25 (×2): qty 8

## 2011-09-25 MED ORDER — LEVOFLOXACIN IN D5W 500 MG/100ML IV SOLN
500.0000 mg | INTRAVENOUS | Status: DC
  Administered 2011-09-25: 500 mg via INTRAVENOUS
  Filled 2011-09-25: qty 100

## 2011-09-25 NOTE — Progress Notes (Addendum)
MD made aware of chest x-ray results, no orders given, MD stated they would come and look at pt. Pt was still short of breath at this time. O2 sats 93-95% on 3L. Rapid response assisting with care of patient at this time.

## 2011-09-25 NOTE — Progress Notes (Signed)
MD made aware that pt was still short of breath and diaphoretic. O2 sats 95-99% on Venturi mask at 12 L with 40%. Pt. Complained of nausea, prn nausea medication was given and pt stated relief of nausea. Rapid response assisting with care of patient.

## 2011-09-25 NOTE — Progress Notes (Addendum)
Pt. Seen by MD, order to give IV lasix and insert foley catheter was given. Orders carried out.

## 2011-09-25 NOTE — Progress Notes (Signed)
SUBJECTIVE:  Called to see this patient with progressive dyspnea.  She is s/p flutter ablation yesterday.  She has a history of diastolic HF.  CXR done this am with new bilateral infiltrates.  She is having some discomfort associated with the difficulty breating  PHYSICAL EXAM Filed Vitals:   09/24/11 1337 09/24/11 1400 09/24/11 1500 09/24/11 2034  BP: 124/71 137/84 136/81 114/71  Pulse: 77 74 74 80  Temp:    98.2 F (36.8 C)  TempSrc:    Oral  Resp: 18 18 18 20   Height:      Weight:      SpO2: 91%  94% 91%   General:  Uncomfortable, anxious   Lungs:  Decreased breath sounds bilaterally. Bilateral crackles w Heart:  RRR Abdomen:  Obese Extremities:  No edema  LABS: Lab Results  Component Value Date   CKTOTAL 143 11/22/2007   CKMB 3.5 11/22/2007   TROPONINI <0.30 09/19/2011   Results for orders placed during the hospital encounter of 09/19/11 (from the past 24 hour(s))  PROTIME-INR     Status: Abnormal   Collection Time   09/24/11  5:40 AM      Component Value Range   Prothrombin Time 23.3 (*) 11.6 - 15.2 (seconds)   INR 2.03 (*) 0.00 - 1.49   BASIC METABOLIC PANEL     Status: Abnormal   Collection Time   09/24/11  5:40 AM      Component Value Range   Sodium 141  135 - 145 (mEq/L)   Potassium 3.9  3.5 - 5.1 (mEq/L)   Chloride 102  96 - 112 (mEq/L)   CO2 28  19 - 32 (mEq/L)   Glucose, Bld 178 (*) 70 - 99 (mg/dL)   BUN 21  6 - 23 (mg/dL)   Creatinine, Ser 7.82  0.50 - 1.10 (mg/dL)   Calcium 9.0  8.4 - 95.6 (mg/dL)   GFR calc non Af Amer >90  >90 (mL/min)   GFR calc Af Amer >90  >90 (mL/min)  PRO B NATRIURETIC PEPTIDE     Status: Abnormal   Collection Time   09/24/11  5:40 AM      Component Value Range   BNP, POC 607.9 (*) 0 - 125 (pg/mL)  CBC     Status: Abnormal   Collection Time   09/24/11  5:40 AM      Component Value Range   WBC 12.4 (*) 4.0 - 10.5 (K/uL)   RBC 5.08  3.87 - 5.11 (MIL/uL)   Hemoglobin 13.8  12.0 - 15.0 (g/dL)   HCT 21.3  08.6 - 57.8 (%)   MCV  83.7  78.0 - 100.0 (fL)   MCH 27.2  26.0 - 34.0 (pg)   MCHC 32.5  30.0 - 36.0 (g/dL)   RDW 46.9  62.9 - 52.8 (%)   Platelets 321  150 - 400 (K/uL)  APTT     Status: Abnormal   Collection Time   09/24/11  5:40 AM      Component Value Range   aPTT 39 (*) 24 - 37 (seconds)  GLUCOSE, CAPILLARY     Status: Abnormal   Collection Time   09/24/11  6:07 AM      Component Value Range   Glucose-Capillary 176 (*) 70 - 99 (mg/dL)  GLUCOSE, CAPILLARY     Status: Abnormal   Collection Time   09/24/11 11:17 AM      Component Value Range   Glucose-Capillary 170 (*) 70 - 99 (mg/dL)  GLUCOSE, CAPILLARY     Status: Abnormal   Collection Time   09/24/11  4:48 PM      Component Value Range   Glucose-Capillary 190 (*) 70 - 99 (mg/dL)   Comment 1 Documented in Chart     Comment 2 Notify RN    GLUCOSE, CAPILLARY     Status: Abnormal   Collection Time   09/24/11  8:29 PM      Component Value Range   Glucose-Capillary 165 (*) 70 - 99 (mg/dL)   Comment 1 Documented in Chart     Comment 2 Notify RN      Intake/Output Summary (Last 24 hours) at 09/25/11 0338 Last data filed at 09/24/11 1900  Gross per 24 hour  Intake   1800 ml  Output      0 ml  Net   1800 ml   EKG:  NSR, rate 81, RAD, no acute ST T wave changes  ASSESSMENT AND PLAN:  Principal Problem: Dyspnea:  Acute with probable pulmonary edema.  I will place a foley and treat with IV lasix.  She will be transferred to step down.  Check a BNP.  Bedside echo did not demonstrate an effusion although the images were very suboptimal. Active Problems:  Flutter:  Maintaining NSR  DM  HYPERLIPIDEMIA  GOUT  OBESITY, MORBID  OBSTRUCTIVE SLEEP APNEA  HYPERTENSION  ATRIAL FIBRILLATION, PAROXYSMAL  DIASTOLIC HEART FAILURE, ACUTE ON CHRONIC    Rollene Rotunda 09/25/2011 3:38 AM

## 2011-09-25 NOTE — Progress Notes (Signed)
Pt. Still complaining of shortness of breath and not being able to catch breath, pt. In upright position , O2 sats 93-95% on 3L. Rapid response called and looked at pt for further evaluation. MD made aware and gave order for chest x-ray.

## 2011-09-25 NOTE — Progress Notes (Signed)
Patient Name: [name]      SUBJECTIVE: events of last pm noted  S/p RFCA w acute CHF last pm-persumed diasystolic  Rx with lasix and Tx to stepdown  Initially better but now with recurring sob, denies chest pain  Also coughing and productive of some sputum with blood tinging  Past Medical History  Diagnosis Date  . Atrial fibrillation /flutter   . Hypertension   . Diabetes mellitus   . Hyperlipidemia   . Gout   . Atrial flutter     dccv: 08/2011 - on Flecainide/coumadin  . Diastolic CHF, chronic     echo 2006 - ef 55-65%; mild diast dysfxn  . Arthritis   . Morbid obesity   . OSA (obstructive sleep apnea)     uses cpap  . CHF (congestive heart failure)   . Ejection fraction     EF 60%, echo, September 19, 2011  . Back pain     PHYSICAL EXAM Filed Vitals:   09/25/11 0425 09/25/11 0500 09/25/11 0600 09/25/11 0823  BP: 159/89 123/63    Pulse: 68 58 61   Temp: 97.9 F (36.6 C)   97.6 F (36.4 C)  TempSrc: Oral   Oral  Resp: 28 24 24    Height: 5\' 1"  (1.549 m)     Weight: 262 lb 9.6 oz (119.115 kg)     SpO2: 97% 99% 98%     General appearance: alert, cooperative, mild distress and morbidly obese Neck: neck with out hematoma,  Lungs: some egophonic changes wtihout rales Heart: regular rate and rhythm, S1, S2 normal, no murmur, click, rub or gallop Abdomen: soft, non-tender; bowel sounds normal; no masses,  no organomegaly Extremities: extremities normal, atraumatic, no cyanosis or edema Pulses: 2+ and symmetric Skin: Skin color, texture, turgor normal. No rashes or lesions Neurologic: Alert and oriented X 3, normal strength and tone. Normal symmetric reflexes. Normal coordination and gait somewhat ill appearing  TELEMETRY: Reviewed telemetry pt in nsr :    Intake/Output Summary (Last 24 hours) at 09/25/11 1033 Last data filed at 09/25/11 0355  Gross per 24 hour  Intake    810 ml  Output      0 ml  Net    810 ml    LABS: Basic Metabolic Panel:  Lab  09/25/11 0630 09/24/11 0540 09/22/11 0700 09/21/11 0500 09/20/11 0500 09/19/11 0520  NA 135 141 140 137 138 141  K 5.4* 3.9 3.3* 3.6 2.7* 3.3*  CL 100 102 97 96 96 100  CO2 24 28 33* 34* 33* 27  GLUCOSE 260* 178* 157* 170* 153* 155*  BUN 32* 21 21 20 20 19   CREATININE 0.88 0.61 0.62 0.66 0.64 0.61  CALCIUM 9.2 9.0 -- -- -- --  MG -- -- -- -- -- --  PHOS -- -- -- -- -- --   Cardiac Enzymes: No results found for this basename: CKTOTAL:3,CKMB:3,CKMBINDEX:3,TROPONINI:3 in the last 72 hours CBC:  Lab 09/25/11 0630 09/24/11 0540 09/19/11 0520  WBC 20.3* 12.4* 13.6*  NEUTROABS -- -- --  HGB 13.6 13.8 13.9  HCT 42.5 42.5 42.4  MCV 84.8 83.7 82.8  PLT 333 321 350   Liver Function Tests: No results found for this basename: AST:2,ALT:2,ALKPHOS:2,BILITOT:2,PROT:2,ALBUMIN:2 in the last 72 hours No results found for this basename: LIPASE:2,AMYLASE:2 in the last 72 hours BNP:  Basename 09/25/11 0630 09/24/11 0540  POCBNP 609.2* 607.9*       ASSESSMENT AND PLAN:  Patient Active Hospital Problem List: Atrial flutter (09/12/2011)  Assessment: s/p RFCA   Plan: continue flec and coumadin Dyspnea   Assessment: multifactorial with diasystolic CHF and possibly an infectious component suggested by elevated WBC and sputum production   Plan: will begin antibiotics with levaquin and continue diuresis Pleural effusion (09/23/2011)   Assessment: resolved by cxr   Plan:  Hyperkalemia  Need to recheck  K was held this am DM (05/16/2009)   Assessment: stable   Plan: continue curent insulin    OBSTRUCTIVE SLEEP APNEA (11/30/2008)   Assessment:  Will try and get cpap for her   Plan:  HYPERTENSION (05/16/2009)   Assessment: stable   Plan: continue current     Signed, Sherryl Manges MD  09/25/2011

## 2011-09-25 NOTE — Significant Event (Signed)
Rapid Response Event Note  Overview: Time Called: 2315 Arrival Time: 2320 Event Type: Respiratory  Initial Focused Assessment: pt on 2LNC, diaphoretic, and labored breathing.  O2 sat 93-96%.  Bilateral BS diminished worse on R than L   Interventions: changed O2 to 40% chest xray previously done.  MD came to see pt at 0325 and orders given for foley, 80mg  IV lasix, and tx to SDU   Event Summary:pt tx  to 2922 via bed on telemetry and O2.  Care resumed by 2900 RN      at          [place], Aggie Hacker

## 2011-09-25 NOTE — Progress Notes (Signed)
ANTIBIOTIC CONSULT NOTE - INITIAL  Pharmacy Consult for vancomycin  Indication: rule out pneumonia  No Known Allergies  Patient Measurements: Height: 5\' 1"  (154.9 cm) Weight: 262 lb 9.6 oz (119.115 kg) IBW/kg (Calculated) : 47.8  Adjusted Body Weight:   Vital Signs: Temp: 99.5 F (37.5 [name]) (12/06 1224) Temp src: Axillary (12/06 1224) BP: 125/65 mmHg (12/06 1500) Pulse Rate: 79  (12/06 1500) Intake/Output from previous day: 12/05 0701 - 12/06 0700 In: 1810 [P.O.:480; I.V.:1320; IV Piggyback:10] Out: -  Intake/Output from this shift: Total I/O In: 460 [P.O.:360; IV Piggyback:100] Out: 1125 [Urine:1125]  Labs:  [facility] 09/25/11 0630 09/24/11 0540  WBC 20.3* 12.4*  HGB 13.6 13.8  PLT 333 321  LABCREA -- --  CREATININE 0.88 0.61   Estimated Creatinine Clearance: 74.7 ml/min (by [name]-G formula based on Cr of 0.88). No results found for this basename: VANCOTROUGH:2,VANCOPEAK:2,VANCORANDOM:2,GENTTROUGH:2,GENTPEAK:2,GENTRANDOM:2,TOBRATROUGH:2,TOBRAPEAK:2,TOBRARND:2,AMIKACINPEAK:2,AMIKACINTROU:2,AMIKACIN:2, in the last 72 hours   Microbiology: Recent Results (from the past 720 hour(s))  MRSA PCR SCREENING     Status: Normal   Collection Time   09/25/11  4:33 AM      Component Value Range Status Comment   MRSA by PCR NEGATIVE  NEGATIVE  Final     Medical History: Past Medical History  Diagnosis Date  . Atrial fibrillation /flutter   . Hypertension   . Diabetes mellitus   . Hyperlipidemia   . Gout   . Atrial flutter     dccv: 08/2011 - on Flecainide/coumadin  . Diastolic CHF, chronic     echo 2006 - ef 55-65%; mild diast dysfxn  . Arthritis   . Morbid obesity   . OSA (obstructive sleep apnea)     uses cpap  . CHF (congestive heart failure)   . Ejection fraction     EF 60%, echo, September 19, 2011  . Back pain     Medications:  Scheduled:    . dextromethorphan-guaiFENesin  1 tablet Oral BID  . flecainide  100 mg Oral BID  . furosemide  80 mg Intravenous Once   . furosemide  80 mg Intravenous BID  . insulin aspart  0-15 Units Subcutaneous TID WC  . insulin aspart  0-5 Units Subcutaneous QHS  . lisinopril  40 mg Oral Daily  . metoprolol  50 mg Oral BID  . piperacillin-tazobactam  3.375 g Intravenous Q8H  . potassium chloride  40 mEq Oral TID  . rosuvastatin  10 mg Oral q1800  . warfarin  2.5 mg Oral Q Wed-1800  . warfarin  5 mg Oral Custom  . DISCONTD: levofloxacin (LEVAQUIN) IV  500 mg Intravenous Q24H   Assessment: 70 yo female iwith new fever, chills, productive cough.  Pharmacy asked to add vancomycin.  Goal of Therapy:  Vancomycin trough level 15-20 mcg/ml  Plan:  1. Vancomycin 1g IV q12. 2. F/U cultures. 3. Monitor renal function.  [name] 09/25/2011,3:57 PM

## 2011-09-25 NOTE — Progress Notes (Signed)
Lab called in a critical troponin of 1.51. Notified Dr. Shirlee Latch and he arrived on the unit to follow up and write new orders.   Nikoloz Huy, Charlaine Dalton Rn

## 2011-09-25 NOTE — Progress Notes (Signed)
   CARE MANAGEMENT NOTE 09/25/2011  Patient:  [name],[name]   Account Number:  [id number]  Date Initiated:  09/19/2011  Documentation initiated by:  SUITS,TERI  Subjective/Objective Assessment:   Pt is 70 yr old seen in the ED per request to arrange home health CHF program.     Action/Plan:   Facilitate referral to home health CHF program   Anticipated DC Date:  09/24/2011   Anticipated DC Plan:  HOME W HOME HEALTH SERVICES      DC Planning Services  CM consult      [facility] Choice  HOME HEALTH   Choice offered to / List presented to:  C-1 Patient        HH arranged  HH-10 DISEASE MANAGEMENT  HH-1 RN      [facility] agency  [facility]   Status of service:  In process, will continue to follow Medicare Important Message given?  NA - LOS <3 / Initial given by admissions (If response is "NO", the following Medicare IM given date fields will be blank) Date Medicare IM given:   Date Additional Medicare IM given:    Discharge Disposition:    Per UR Regulation:  Reviewed for med. necessity/level of care/duration of stay  Comments:  UR Concurrent Review Completed. 09/25/11 1205 Shannan Harper, RN, BSN Patient was a rapid response later following s/p ablation procedure.  Was transferred to Step Down for further treatment as she is now experiencing an acute CHF exacerbation.  Will continue to monitor.  09/23/11 0900 Shannan Harper, RN, BSN Able to refer to [facility], waiting to hear from [employer], RN Case Production designer, theatre/television/film.  Also able to provide patient with a scale.  Will continue to assist.  UR Completed. 09/22/11 1130 Shannan Harper, RN, BSN  Met with patient.  Patient agreed to [facility] referral. Also will determine if she is appropriate for scales program. Will continue to assist.  Discussed education measures, diet, salt, daily weights, and zone goals.  09/21/11 1200 Physician, please order HH RN for Heart Failure Home Health Screen and complete Face to Face documentation.  Pt. has chosen  [facility] for services.  NCM to follow.  Tera Mater, RN, BSN Case Manager # [phone].    09/19/11 12:05 In to see patient and visitor to discuss home health DM options, pt selected [facility] because she has DME from them.  Per patient and nursing pt. is being admitted to the hospital from the ED. Jim Like RN BSN CCM

## 2011-09-25 NOTE — Progress Notes (Signed)
Pt. Complained of shortness of breath, and pt. Appeared anxious. Husband was down in ED being seen as well. Pt. O2 sats were 92-94% on 2L. MD aware and gave order to give prn medication for anxiety. Will continue to monitor.

## 2011-09-25 NOTE — Progress Notes (Signed)
Pt. Still complaining of Shortness of breath. O2 sats 93% on 3L. MD made aware, no orders were given.

## 2011-09-25 NOTE — Progress Notes (Signed)
Now ACS/STEMI: repeat INR tonight is 2.12.  1) Follow up on INR in am.  If <2, will start heparin per pharmacy.  WIll follow.

## 2011-09-25 NOTE — Progress Notes (Signed)
ANTICOAGULATION CONSULT NOTE - Follow Up Consult  Pharmacy Consult for Warfarin Indication: Atrial Fibrillation  No Known Allergies  Patient Measurements: Height: 5\' 1"  (154.9 cm) Weight: 262 lb 9.6 oz (119.115 kg) IBW/kg (Calculated) : 47.8    Vital Signs: Temp: 99.5 F (37.5 C) (12/06 1224) Temp src: Axillary (12/06 1224) BP: 137/83 mmHg (12/06 0800) Pulse Rate: 67  (12/06 0800)  Labs:  Basename 09/25/11 0630 09/24/11 0540 09/23/11 0518  HGB 13.6 13.8 --  HCT 42.5 42.5 --  PLT 333 321 --  APTT -- 39* --  LABPROT 23.2* 23.3* 25.7*  INR 2.02* 2.03* 2.30*  HEPARINUNFRC -- -- --  CREATININE 0.88 0.61 --  CKTOTAL -- -- --  CKMB -- -- --  TROPONINI -- -- --   Estimated Creatinine Clearance: 74.7 ml/min (by C-G formula based on Cr of 0.88).   Medications:  Prescriptions prior to admission  Medication Sig Dispense Refill  . colchicine (COLCRYS) 0.6 MG tablet Take 0.6 mg by mouth daily as needed. For gout      . diltiazem (TIAZAC) 240 MG 24 hr capsule Take 240 mg by mouth daily.        . flecainide (TAMBOCOR) 100 MG tablet Take 1 tablet (100 mg total) by mouth 2 (two) times daily.  60 tablet  6  . HYDROcodone-acetaminophen (NORCO) 10-325 MG per tablet Take 1 tablet by mouth every 6 (six) hours as needed. For pain      . lisinopril (PRINIVIL,ZESTRIL) 40 MG tablet Take 40 mg by mouth daily.        . metoprolol (LOPRESSOR) 100 MG tablet Take 100 mg by mouth 2 (two) times daily.        . potassium chloride (K-DUR) 10 MEQ tablet       . pravastatin (PRAVACHOL) 80 MG tablet Take 80 mg by mouth daily.       Marland Kitchen triamterene-hydrochlorothiazide (MAXZIDE-25) 37.5-25 MG per tablet Take 1 tablet by mouth daily.        Marland Kitchen warfarin (COUMADIN) 5 MG tablet Take 2.5-5 mg by mouth daily. 1 tablet every day except take 1/2 tablet on wednesdays       Scheduled:     . dextromethorphan-guaiFENesin  1 tablet Oral BID  . flecainide  100 mg Oral BID  . furosemide  80 mg Intravenous Once  .  furosemide  80 mg Intravenous BID  . insulin aspart  0-15 Units Subcutaneous TID WC  . insulin aspart  0-5 Units Subcutaneous QHS  . levofloxacin (LEVAQUIN) IV  500 mg Intravenous Q24H  . lisinopril  40 mg Oral Daily  . metoprolol  50 mg Oral BID  . potassium chloride  40 mEq Oral TID  . rosuvastatin  10 mg Oral q1800  . warfarin  2.5 mg Oral Q Wed-1800  . warfarin  5 mg Oral Custom   Warfarin History: PTA dose was 5 mg daily EXCEPT for 2.5 mg on Wednesdays only  Assessment: 70 y.o. F on warfarin for Afib with a therapeutic INR this a.m. No CBC today. No signs/symptoms of bleeding noted. Pt has been continued on her home dose thus far.  Goal of Therapy:  INR 2-3   Plan:  1. Coumadin 5 mg daily except 2.5 mg Wed 2. Will continue to monitor for any signs/symptoms of bleeding and will follow up with PT/INR in the am   Angell Pincock C 09/25/2011,2:34 PM

## 2011-09-25 NOTE — Op Note (Signed)
NAMETONEE, SILVERSTEIN NO.:  [id number]  MEDICAL RECORD NO.:  [id number]  LOCATION:  2032                         FACILITY:  [facility]  PHYSICIAN:  Doylene Canning. Ladona Ridgel, MD    DATE OF BIRTH:  12-27-1940  DATE OF PROCEDURE:  09/24/2011 DATE OF DISCHARGE:                              OPERATIVE REPORT   PROCEDURE PERFORMED:  Electrophysiologic study and catheter ablation of atrial flutter.  INTRODUCTION:  The patient is a very pleasant, morbidly obese, 70 year old woman with a history of tachy palpitations and documented atrial flutter.  She has undergone cardioversion but had early return of atrial flutter despite medical therapy with flecainide.  She is now referred for catheter ablation.  PROCEDURE:  After informed was obtained, the patient was taken to the diagnostic EP lab in a fasting state.  After usual preparation and draping, the patient was intubated, and general anesthesia was applied by the anesthesia service.  Of note, this was performed as the patient was morbidly obese and suffered from sleep apnea and had chronic back pain, and it was deemed most appropriate for this degree of sedation.  A 6-French quadripolar catheter was inserted percutaneously into the right femoral vein and advanced to the His-bundle region.  A 6-French octapolar catheter was inserted percutaneously into the right femoral vein and advanced into the coronary sinus.  At this point, mapping was carried out.  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  A 7-French quadripolar ablation catheter was advanced into the right femoral vein and up into the right atrium.  Additional mapping was carried out.  At this point, a total of 9 RF energy applications were subsequently delivered.  During the 2nd RF energy application, atrial flutter was terminated and sinus rhythm was restored.  Three additional RF energy applications were delivered resulting in  the creation of atrial flutter isthmus block.  Four additional Bonus RF energy applications were delivered, and the patient was observed for approximately 30 minutes.  During this time, rapid ventricular pacing was carried out from the right ventricle demonstrating VA dissociation at 600 msec.  In addition, programmed ventricular stimulation was carried out demonstrating VA dissociation at 600 msec.  Next, rapid atrial pacing was carried out from the coronary sinus and high right atrium in stepwise decreased down to 450 msec where AV Wenckebach was observed.  During rapid atrial pacing, the PR interval was less than the RR interval, and there was no inducible SVT. Programmed atrial stimulation was then carried out at a base drive cycle length of 161 msec and the S1-S2 interval stepwise decreased down to 320 msec with AV node ERP was observed.  During programmed atrial stimulation, there were no AH jumps, no echo beats, and no inducible SVT.  At this point, the catheters were removed.  Hemostasis was assured, and she was returned to the recovery area having been extubated by our anesthesia service.  COMPLICATIONS:  There are no immediate procedure complications.  RESULTS: 1. Baseline ECG.  Baseline ECG demonstrates atrial flutter with 2-1 AV     conduction. 2. Baseline intervals.  The atrial flutter cycle length was 290 msec,     the HV interval was  55 msec.  Following ablation, the sinus node     cycle length was 1200 msec, the AH interval 88 msec and the HV     interval 57 msec.  The QRS duration was unchanged at 90 msec. 3. Rapid ventricular pacing.  Following ablation, rapid ventricular     pacing was carried out from the right ventricle demonstrating VA     dissociation at 600 msec. 4. Programmed ventricular stimulation.  Programmed ventricular     stimulation was carried out from the right ventricle at a base     drive cycle length of 409 msec demonstrated VA dissociation. 5.  Rapid atrial pacing.  Rapid atrial pacing was carried out from the     coronary sinus from the right atrium.  At the base drive cycle     length of 600 msec, stepwise decreased down to 450 msec where AV     Wenckebach was observed.  During programmed atrial stimulation,     there were no AH jumps, no echo beats, no inducible SVT. 6. Programmed atrial stimulation.  Programmed atrial stimulation was     carried out from the coronary sinus and high right atrium at a base     drive cycle length of 811 msec.  The S1-S2 interval stepwise     decreased down to 220 msec with AV node ERP was observed.  During     programmed atrial stimulation, there were no AH jumps, no echo     beats, no inducible SVT. 7. Arrhythmia is observed. Atrial flutter initiation was present at     the time of EP study, duration was sustained.  Cycle length was 290     msec.  Method of termination was with catheter ablation. 8. Mapping.  Mapping of atrial flutter isthmus demonstrated typical     counterclockwise tricuspid annular reentrant activation. 9. RF energy application.  Total of 9 RF energy applications were     delivered, including 4 Bonus RF energy applications resulting in     termination of atrial flutter.  After the 2nd RF energy     application, creation of the atrial flutter isthmus block after the     5th RF energy application.  CONCLUSION:  Study demonstrates successful electrophysiologic study and catheter ablation of typical atrial flutter with a total of 9 RF energy applications delivered to the atrial flutter isthmus.     Doylene Canning. Ladona Ridgel, MD     GWT/MEDQ  D:  09/24/2011  T:  09/24/2011  Job:  [id number]  cc:   Duke Salvia, MD, [facility]

## 2011-09-25 NOTE — Progress Notes (Addendum)
Patient ID: [name], female   DOB: 05-05-1941, 70 y.o.   MRN: [id number] CHF Service Rounding Note  Subjective:   S/P RFCA 12/5 with increased dyspnea noted last night. Transferred to step down for closer observation and IV diuretics. Known diastolic heart failure. She denies SOB on exertion. Complains of fever, chills, productive cough with yellow sputum over the last few days. CXR suggest HAP. Day one of antibiotics.    Intake/Output Summary (Last 24 hours) at 09/25/11 1533 Last data filed at 09/25/11 1311  Gross per 24 hour  Intake    710 ml  Output   1125 ml  Net   -415 ml    Current meds:    . dextromethorphan-guaiFENesin  1 tablet Oral BID  . flecainide  100 mg Oral BID  . furosemide  80 mg Intravenous Once  . furosemide  80 mg Intravenous BID  . insulin aspart  0-15 Units Subcutaneous TID WC  . insulin aspart  0-5 Units Subcutaneous QHS  . levofloxacin (LEVAQUIN) IV  500 mg Intravenous Q24H  . lisinopril  40 mg Oral Daily  . metoprolol  50 mg Oral BID  . potassium chloride  40 mEq Oral TID  . rosuvastatin  10 mg Oral q1800  . warfarin  2.5 mg Oral Q Wed-1800  . warfarin  5 mg Oral Custom   Infusions:    . sodium chloride 50 mL/hr at 09/24/11 1236     Objective:  Blood pressure 154/94, pulse 76, temperature 99.5 F (37.5 C), temperature source Axillary, resp. rate 25, height 5\' 1"  (1.549 m), weight 119.115 kg (262 lb 9.6 oz), SpO2 99.00%. Weight change: 0.59 kg (1 lb 4.8 oz)  Physical Exam: General:  Well appearing. No resp difficulty HEENT: normal Neck: supple. JVP 10 cm. Carotids 2+ bilat; no bruits. No lymphadenopathy or thryomegaly appreciated. Cor: PMI nondisplaced. Regular rate & rhythm. No rubs, gallops or murmurs. Lungs: RML, RLL, and LLL crackles.  Bronchial breath sounds right base.  Abdomen: soft, nontender, nondistended. No hepatosplenomegaly. No bruits or masses. Good bowel sounds. Extremities: no cyanosis, clubbing, rash, edema Neuro:  alert & orientedx3, cranial nerves grossly intact. moves all 4 extremities w/o difficulty. Affect pleasant  Telemetry:   Lab Results: Basic Metabolic Panel:  Lab 09/25/11 9147 09/24/11 0540 09/22/11 0700 09/21/11 0500 09/20/11 0500  NA 135 141 140 137 138  K 5.4* 3.9 -- -- --  CL 100 102 97 96 96  CO2 24 28 33* 34* 33*  GLUCOSE 260* 178* 157* 170* 153*  BUN 32* 21 21 20 20   CREATININE 0.88 0.61 0.62 0.66 0.64  CALCIUM 9.2 9.0 8.9 8.8 8.6  MG -- -- -- -- --  PHOS -- -- -- -- --   Liver Function Tests: No results found for this basename: AST:5,ALT:5,ALKPHOS:5,BILITOT:5,PROT:5,ALBUMIN:5 in the last 168 hours No results found for this basename: LIPASE:5,AMYLASE:5 in the last 168 hours No results found for this basename: AMMONIA:5 in the last 168 hours CBC:  Lab 09/25/11 0630 09/24/11 0540 09/19/11 0520  WBC 20.3* 12.4* 13.6*  NEUTROABS -- -- --  HGB 13.6 13.8 13.9  HCT 42.5 42.5 42.4  MCV 84.8 83.7 82.8  PLT 333 321 350   Cardiac Enzymes:  Lab 09/19/11 0520  CKTOTAL --  CKMB --  CKMBINDEX --  TROPONINI <0.30   BNP:  Lab 09/25/11 0630 09/24/11 0540 09/19/11 0734  POCBNP 609.2* 607.9* 986.4*   CBG:  Lab 09/25/11 1224 09/25/11 0918 09/24/11 2029 09/24/11 1648 09/24/11 1117  GLUCAP 195* 226* 165* 190* 170*   Microbiology: Lab Results  Component Value Date   CULT Multiple bacterial morphotypes present, none predominant. Suggest appropriate recollection if clinically indicated. 05/25/2009   No results found for this basename: CULT:2,SDES:2 in the last 168 hours  Imaging: Dg Chest Port 1 View  09/25/2011  *RADIOLOGY REPORT*  Clinical Data: Shortness of breath.  PORTABLE CHEST - 1 VIEW  Comparison: Chest radiograph performed 09/23/2011  Findings: The lungs are well-aerated.  Diffuse bilateral airspace opacification is noted, with sparing of the lung apices.  This is most prominent at the right perihilar region, and may reflect significant multifocal pneumonia or  pulmonary edema.  A small right pleural effusion is noted.  No pneumothorax is seen.  Underlying vascular congestion is appreciated.  The cardiomediastinal silhouette is mildly enlarged.  No acute osseous abnormalities are seen.  IMPRESSION:  1.  Diffuse bilateral airspace opacification, with sparing of the lung apices.  This is most prominent at the right perihilar region, and may reflect significant multifocal pneumonia or pulmonary edema.  Small right pleural effusion noted. 2.  Underlying vascular congestion and mild cardiomegaly.  Original Report Authenticated By: Tonia Ghent, M.D.     ASSESSMENT:  1. Acute on chronic diastolic heart failure: Recent echo with normal EF.  Patient certainly has a component of acute diastolic CHF.  JVP is elevated and CXR suggests a component of pulmonary edema.  She is now on Lasix 80 mg IV bid started last night and is feeling better.  Would get a set of cardiac enzymes to rule out acute ischemia as etiology.  ECG is unchanged.  Will talk to Dr. Graciela Husbands about holding the flecainide for now with possible acute pulmonary edema.  2. PNA: I am worried that there is also a component of hospital-acquired PNA.  Infiltrates on CXR are diffuse and could be consistent with either CHF or multilobar PNA.  Her WBCs have risen from 12K to 20K and she has a low grade fever.  Bronchial breath sounds at right base are concerning for PNA.  I would stop levofloxacin and put her on coverage for HCAP with Zosyn and vanco.  She needs blood cultures at least prior to the new abx.    Marca Ancona 09/25/2011 3:44 PM  Called for elevated troponin of 1.51.  She denies chest pain.  She is being treated for acute diastolic CHF and PNA.  I repeated an ECG, it is unchanged compared early this am.  There is very minimal inferior ST elevation of uncertain significance. Elevated troponin may be demand ischemia from acute pulmonary edema.  However, concerned for possibility of ACS with resulting flash  pulmonary edema.  I am going to hold coumadin tonight and repeat an INR this evening.  Will start heparin gtt when INR is < 2. She was cardioverted this admission, of note.  Will repeat cardiac enzymes at 9 pm for progression.   Marca Ancona 09/25/2011 5:55 PM

## 2011-09-25 NOTE — Progress Notes (Signed)
MD gave order to tx pt to step down unit. Report was called to receiving RN and pt was transferred at 04:45am.

## 2011-09-26 DIAGNOSIS — I5033 Acute on chronic diastolic (congestive) heart failure: Secondary | ICD-10-CM

## 2011-09-26 LAB — CBC
HCT: 40.2 % (ref 36.0–46.0)
Hemoglobin: 12.7 g/dL (ref 12.0–15.0)
MCH: 27.3 pg (ref 26.0–34.0)
MCV: 84.1 fL (ref 78.0–100.0)
RBC: 4.65 MIL/uL (ref 3.87–5.11)
RBC: 4.78 MIL/uL (ref 3.87–5.11)
WBC: 14.9 10*3/uL — ABNORMAL HIGH (ref 4.0–10.5)

## 2011-09-26 LAB — GLUCOSE, CAPILLARY: Glucose-Capillary: 138 mg/dL — ABNORMAL HIGH (ref 70–99)

## 2011-09-26 LAB — BASIC METABOLIC PANEL
Chloride: 97 mEq/L (ref 96–112)
GFR calc non Af Amer: 87 mL/min — ABNORMAL LOW (ref >90)
Glucose, Bld: 186 mg/dL — ABNORMAL HIGH (ref 70–99)
Potassium: 2.8 mEq/L — ABNORMAL LOW (ref 3.5–5.1)
Sodium: 138 mEq/L (ref 135–145)

## 2011-09-26 LAB — PROTIME-INR: INR: 2.2 — ABNORMAL HIGH (ref 0.00–1.49)

## 2011-09-26 LAB — CARDIAC PANEL(CRET KIN+CKTOT+MB+TROPI): Relative Index: 3.3 — ABNORMAL HIGH (ref 0.0–2.5)

## 2011-09-26 MED ORDER — PIPERACILLIN-TAZOBACTAM 3.375 G IVPB
3.3750 g | Freq: Three times a day (TID) | INTRAVENOUS | Status: DC
  Administered 2011-09-26 – 2011-09-27 (×2): 3.375 g via INTRAVENOUS
  Filled 2011-09-26 (×4): qty 50

## 2011-09-26 MED ORDER — SODIUM CHLORIDE 0.9 % IV SOLN
2500.0000 mg | Freq: Once | INTRAVENOUS | Status: AC
  Administered 2011-09-26: 2500 mg via INTRAVENOUS
  Filled 2011-09-26: qty 2500

## 2011-09-26 MED ORDER — FUROSEMIDE 10 MG/ML IJ SOLN
80.0000 mg | Freq: Once | INTRAMUSCULAR | Status: AC
  Administered 2011-09-26: 80 mg via INTRAVENOUS

## 2011-09-26 MED ORDER — WARFARIN SODIUM 5 MG PO TABS
5.0000 mg | ORAL_TABLET | Freq: Once | ORAL | Status: AC
  Administered 2011-09-26: 5 mg via ORAL
  Filled 2011-09-26: qty 1

## 2011-09-26 MED ORDER — FUROSEMIDE 40 MG PO TABS
40.0000 mg | ORAL_TABLET | Freq: Two times a day (BID) | ORAL | Status: DC
  Administered 2011-09-26 – 2011-09-28 (×4): 40 mg via ORAL
  Filled 2011-09-26 (×6): qty 1

## 2011-09-26 MED ORDER — FUROSEMIDE 40 MG PO TABS
40.0000 mg | ORAL_TABLET | Freq: Two times a day (BID) | ORAL | Status: DC
Start: 2011-09-26 — End: 2011-09-26

## 2011-09-26 MED ORDER — VANCOMYCIN HCL IN DEXTROSE 1-5 GM/200ML-% IV SOLN
1000.0000 mg | Freq: Two times a day (BID) | INTRAVENOUS | Status: DC
  Administered 2011-09-27: 1000 mg via INTRAVENOUS
  Filled 2011-09-26 (×2): qty 200

## 2011-09-26 MED ORDER — POTASSIUM CHLORIDE CRYS ER 20 MEQ PO TBCR
80.0000 meq | EXTENDED_RELEASE_TABLET | Freq: Two times a day (BID) | ORAL | Status: DC
  Administered 2011-09-26 – 2011-10-02 (×14): 80 meq via ORAL
  Filled 2011-09-26 (×16): qty 4

## 2011-09-26 NOTE — Progress Notes (Signed)
Subjective:   70 y/o woman with DM2, morbid obesity, AFIB/AFL S/P RFCA 12/5 with increased dyspnea noted last night. Transferred to step down for closer observation and IV diuretics. BNP 600+. WBC 12k-20k. Recent Myoview negative per Dr. Graciela Husbands.   Started on broad spectrum abx yesterday for presumed HCAP. Lasix 80 IV bid. Diuresed. Still with productive cough with brown/green sputum.  Breathing better. Denies SOB/CP/PND/Orthopnea. Productive cough (yellow sputum). Day two of antibiotics. Blood cultures pending. UA negative.   Trop 1.5 -> 1.2   Intake/Output Summary (Last 24 hours) at 09/26/11 1610 Last data filed at 09/26/11 0600  Gross per 24 hour  Intake    800 ml  Output   4475 ml  Net  -3675 ml    Current meds:    . dextromethorphan-guaiFENesin  1 tablet Oral BID  . furosemide  80 mg Intravenous BID  . insulin aspart  0-15 Units Subcutaneous TID WC  . insulin aspart  0-5 Units Subcutaneous QHS  . lisinopril  40 mg Oral Daily  . metoprolol  50 mg Oral BID  . piperacillin-tazobactam  3.375 g Intravenous Q8H  . rosuvastatin  10 mg Oral q1800  . vancomycin  1,000 mg Intravenous Q12H  . white petrolatum      . DISCONTD: flecainide  100 mg Oral BID  . DISCONTD: levofloxacin (LEVAQUIN) IV  500 mg Intravenous Q24H  . DISCONTD: warfarin  2.5 mg Oral Q Wed-1800  . DISCONTD: warfarin  5 mg Oral Custom   Infusions:     Objective:  Blood pressure 131/62, pulse 72, temperature 98.6 F (37 C), temperature source Oral, resp. rate 19, height 5\' 1"  (1.549 m), weight 119.115 kg (262 lb 9.6 oz), SpO2 97.00%. Weight change:   Physical Exam: General:  Well appearing. No resp difficulty HEENT: normal Neck: supple. JVP 8-9 Carotids 2+ bilat; no bruits. No lymphadenopathy or thryomegaly appreciated. Cor: PMI nondisplaced. Regular rate & rhythm. No rubs, gallops or murmurs. Lungs: RML RLL crackles on 2 liters Abdomen: soft, nontender, nondistended. No hepatosplenomegaly. No bruits or  masses. Good bowel sounds. Extremities: no cyanosis, clubbing, rash, edema Neuro: alert & orientedx3, cranial nerves grossly intact. moves all 4 extremities w/o difficulty. Affect pleasant  Telemetry: SR  Lab Results: Basic Metabolic Panel:  Lab 09/26/11 9604 09/25/11 1600 09/25/11 0630 09/24/11 0540 09/22/11 0700  NA 138 137 135 141 140  K 2.8* 3.8 -- -- --  CL 97 101 100 102 97  CO2 31 26 24 28  33*  GLUCOSE 186* 141* 260* 178* 157*  BUN 23 30* 32* 21 21  CREATININE 0.71 0.74 0.88 0.61 0.62  CALCIUM 9.0 9.2 9.2 9.0 8.9  MG -- -- -- -- --  PHOS -- -- -- -- --   Liver Function Tests: No results found for this basename: AST:5,ALT:5,ALKPHOS:5,BILITOT:5,PROT:5,ALBUMIN:5 in the last 168 hours No results found for this basename: LIPASE:5,AMYLASE:5 in the last 168 hours No results found for this basename: AMMONIA:5 in the last 168 hours CBC:  Lab 09/26/11 0507 09/25/11 0630 09/24/11 0540  WBC 16.1* 20.3* 12.4*  NEUTROABS -- -- --  HGB 12.7 13.6 13.8  HCT 39.1 42.5 42.5  MCV 84.1 84.8 83.7  PLT 299 333 321   Cardiac Enzymes:  Lab 09/25/11 2356 09/25/11 1600  CKTOTAL 114 123  CKMB 4.3* 4.5*  CKMBINDEX -- --  TROPONINI 1.21* 1.51*   BNP:  Lab 09/25/11 0630 09/24/11 0540  POCBNP 609.2* 607.9*   CBG:  Lab 09/25/11 2148 09/25/11 1730 09/25/11 1224 09/25/11  1610 09/24/11 2029  GLUCAP 186* 128* 195* 226* 165*   Microbiology: Lab Results  Component Value Date   CULT Multiple bacterial morphotypes present, none predominant. Suggest appropriate recollection if clinically indicated. 05/25/2009   No results found for this basename: CULT:2,SDES:2 in the last 168 hours  Imaging: Dg Chest Port 1 View  09/25/2011  *RADIOLOGY REPORT*  Clinical Data: Shortness of breath.  PORTABLE CHEST - 1 VIEW  Comparison: Chest radiograph performed 09/23/2011  Findings: The lungs are well-aerated.  Diffuse bilateral airspace opacification is noted, with sparing of the lung apices.  This is most  prominent at the right perihilar region, and may reflect significant multifocal pneumonia or pulmonary edema.  A small right pleural effusion is noted.  No pneumothorax is seen.  Underlying vascular congestion is appreciated.  The cardiomediastinal silhouette is mildly enlarged.  No acute osseous abnormalities are seen.  IMPRESSION:  1.  Diffuse bilateral airspace opacification, with sparing of the lung apices.  This is most prominent at the right perihilar region, and may reflect significant multifocal pneumonia or pulmonary edema.  Small right pleural effusion noted. 2.  Underlying vascular congestion and mild cardiomegaly.  Original Report Authenticated By: Tonia Ghent, M.D.    ASSESSMENT:  1. Acute/chroinc diastolic heart failure (EF 60%) 2. Health care acquired pneumonia 3. DM 4. HTN 5. OSA 6. Gout 7. Hypokalemia 8. Acute respiratory failure - improving 9. Morbid obesity 10. AF/AFL - s/p AFL RFA 12/5  PLAN/DISCUSSION: Improving slowly. Volume status much improved. Switch to po lasix and watch volume status closely. (was on maxide pre-admit). Continue Abx for 7-10 days per HCAP protocol. Resume coumadin. Suspect elevated trponin due to RFA +/- demand. According to Dr. Graciela Husbands had recent negative Myoview so do not feel cath indicated. Will need to have flecainide reinitiated -will leave this to Dr. Odessa Fleming discretion. Supp K+. Keep in stepdown today and probably to floor tomorrow.

## 2011-09-26 NOTE — Progress Notes (Signed)
ANTICOAGULATION CONSULT NOTE - Follow Up Consult  Pharmacy Consult for: Coumadin Indication: Aflutter s/p ablation  No Known Allergies  Patient Measurements: Height: 5\' 1"  (154.9 cm) Weight: 262 lb 9.6 oz (119.115 kg) IBW/kg (Calculated) : 47.8   Vital Signs: Temp: 98.6 F (37 C) (12/07 0805) Temp src: Oral (12/07 0805) BP: 126/73 mmHg (12/07 0800) Pulse Rate: 75  (12/07 0800)  Labs:  Basename 09/26/11 0507 09/25/11 2356 09/25/11 1802 09/25/11 1600 09/25/11 0630 09/24/11 0540  HGB 12.7 -- -- -- 13.6 --  HCT 39.1 -- -- -- 42.5 42.5  PLT 299 -- -- -- 333 321  APTT -- -- -- -- -- 39*  LABPROT 24.8* -- 24.1* -- 23.2* --  INR 2.20* -- 2.12* -- 2.02* --  HEPARINUNFRC -- -- -- -- -- --  CREATININE 0.71 -- -- 0.74 0.88 --  CKTOTAL -- 114 -- 123 -- --  CKMB -- 4.3* -- 4.5* -- --  TROPONINI -- 1.21* -- 1.51* -- --   Estimated Creatinine Clearance: 82.2 ml/min (by C-G formula based on Cr of 0.71).  Assessment: 69yof on coumadin for aflutter now s/p ablation. Coumadin held 12/6 secondary to elevated troponin and potential need for cath. INR 2.12-->2.2 today despite holding coumadin 12/6. Coumadin now to be resumed as elevated troponin likely due to ablation +/- demand and no cath needed.  Goal of Therapy:  INR 2-3   Plan:  1) Coumadin 5mg  x 1 2) Follow up INR in AM  Fredrik Rigger 09/26/2011,9:00 AM

## 2011-09-26 NOTE — Progress Notes (Signed)
OFFERED TO DISCONTINUE THE FOLEY CATH , BUT REFUSED AT THIS TIME. TO FOLLOW-UP LATER.

## 2011-09-27 DIAGNOSIS — I4892 Unspecified atrial flutter: Secondary | ICD-10-CM

## 2011-09-27 LAB — CBC
HCT: 40.9 % (ref 36.0–46.0)
MCH: 27.7 pg (ref 26.0–34.0)
MCV: 83.8 fL (ref 78.0–100.0)
Platelets: 312 10*3/uL (ref 150–400)
RBC: 4.88 MIL/uL (ref 3.87–5.11)
RDW: 15.1 % (ref 11.5–15.5)
WBC: 11.4 10*3/uL — ABNORMAL HIGH (ref 4.0–10.5)

## 2011-09-27 LAB — BASIC METABOLIC PANEL
CO2: 29 mEq/L (ref 19–32)
Calcium: 8.8 mg/dL (ref 8.4–10.5)
Glucose, Bld: 192 mg/dL — ABNORMAL HIGH (ref 70–99)
Potassium: 3.4 mEq/L — ABNORMAL LOW (ref 3.5–5.1)
Sodium: 139 mEq/L (ref 135–145)

## 2011-09-27 LAB — PROTIME-INR
INR: 1.7 — ABNORMAL HIGH (ref 0.00–1.49)
Prothrombin Time: 20.3 seconds — ABNORMAL HIGH (ref 11.6–15.2)

## 2011-09-27 LAB — GLUCOSE, CAPILLARY: Glucose-Capillary: 197 mg/dL — ABNORMAL HIGH (ref 70–99)

## 2011-09-27 MED ORDER — METOPROLOL TARTRATE 50 MG PO TABS
50.0000 mg | ORAL_TABLET | Freq: Four times a day (QID) | ORAL | Status: DC
  Administered 2011-09-27 – 2011-09-30 (×14): 50 mg via ORAL
  Filled 2011-09-27 (×20): qty 1

## 2011-09-27 MED ORDER — SODIUM CHLORIDE 0.9 % IV SOLN
INTRAVENOUS | Status: DC
  Administered 2011-09-27: 21:00:00 via INTRAVENOUS
  Administered 2011-09-29: 10 mL/h via INTRAVENOUS

## 2011-09-27 MED ORDER — SODIUM CHLORIDE 0.9 % IV SOLN: 2500.0000 mg | Freq: Once | INTRAVENOUS | Status: DC

## 2011-09-27 MED ORDER — HEPARIN SOD (PORCINE) IN D5W 100 UNIT/ML IV SOLN
1000.0000 [IU]/h | INTRAVENOUS | Status: DC
  Administered 2011-09-27: 1000 [IU]/h via INTRAVENOUS
  Filled 2011-09-27: qty 250

## 2011-09-27 MED ORDER — WARFARIN SODIUM 7.5 MG PO TABS
7.5000 mg | ORAL_TABLET | Freq: Once | ORAL | Status: AC
  Administered 2011-09-27: 7.5 mg via ORAL
  Filled 2011-09-27: qty 1

## 2011-09-27 MED ORDER — DILTIAZEM LOAD VIA INFUSION
10.0000 mg | Freq: Once | INTRAVENOUS | Status: AC
  Administered 2011-09-27: 10 mg via INTRAVENOUS

## 2011-09-27 MED ORDER — LOPERAMIDE HCL 1 MG/5ML PO LIQD
2.0000 mg | ORAL | Status: DC | PRN
  Administered 2011-09-27 – 2011-09-29 (×4): 2 mg via ORAL
  Filled 2011-09-27 (×5): qty 10

## 2011-09-27 MED ORDER — GUAIFENESIN-DM 100-10 MG/5ML PO SYRP
5.0000 mL | ORAL_SOLUTION | ORAL | Status: DC | PRN
  Filled 2011-09-27: qty 5

## 2011-09-27 MED ORDER — DEXTROSE 5 % IV SOLN
2.0000 g | Freq: Two times a day (BID) | INTRAVENOUS | Status: DC
  Administered 2011-09-27 – 2011-09-29 (×5): 2 g via INTRAVENOUS
  Filled 2011-09-27 (×6): qty 2

## 2011-09-27 MED ORDER — VANCOMYCIN HCL IN DEXTROSE 1-5 GM/200ML-% IV SOLN
1000.0000 mg | Freq: Two times a day (BID) | INTRAVENOUS | Status: DC
  Administered 2011-09-27 – 2011-09-29 (×4): 1000 mg via INTRAVENOUS
  Filled 2011-09-27 (×6): qty 200

## 2011-09-27 MED ORDER — DILTIAZEM HCL 100 MG IV SOLR
5.0000 mg/h | INTRAVENOUS | Status: DC
  Administered 2011-09-27: 5 mg/h via INTRAVENOUS
  Administered 2011-09-27 – 2011-09-28 (×2): 10 mg/h via INTRAVENOUS
  Filled 2011-09-27: qty 100

## 2011-09-27 MED ORDER — HEPARIN SOD (PORCINE) IN D5W 100 UNIT/ML IV SOLN
1450.0000 [IU]/h | INTRAVENOUS | Status: DC
  Administered 2011-09-28 – 2011-10-01 (×6): 1450 [IU]/h via INTRAVENOUS
  Filled 2011-09-27 (×9): qty 250

## 2011-09-27 NOTE — Progress Notes (Signed)
ANTICOAGULATION CONSULT NOTE - Follow Up Consult  Pharmacy Consult for heparin Indication: atrial fibrillation  No Known Allergies  Patient Measurements: Height: 5\' 1"  (154.9 cm) Weight: 262 lb 9.6 oz (119.115 kg) IBW/kg (Calculated) : 47.8  Adjusted Body Weight:   Vital Signs: Temp: 99 F (37.2 C) (12/08 1706) Temp src: Oral (12/08 1706) BP: 142/101 mmHg (12/08 1706) Pulse Rate: 119  (12/08 1706)  Labs:  Basename 09/27/11 1542 09/27/11 0500 09/26/11 0902 09/26/11 0901 09/26/11 0507 09/25/11 2356 09/25/11 1802 09/25/11 1600  HGB 13.5 -- -- 13.0 -- -- -- --  HCT 40.9 -- -- 40.2 39.1 -- -- --  PLT 312 -- -- 288 299 -- -- --  APTT -- -- -- -- -- -- -- --  LABPROT -- 20.3* -- -- 24.8* -- 24.1* --  INR -- 1.70* -- -- 2.20* -- 2.12* --  HEPARINUNFRC <0.10* -- -- -- -- -- -- --  CREATININE -- 0.77 -- -- 0.71 -- -- 0.74  CKTOTAL -- -- 102 -- -- 114 -- 123  CKMB -- -- 3.4 -- -- 4.3* -- 4.5*  TROPONINI -- -- 0.68* -- -- 1.21* -- 1.51*   Estimated Creatinine Clearance: 82.2 ml/min (by C-G formula based on Cr of 0.77).   Medications:  Scheduled:    . ceFEPime (MAXIPIME) IV  2 g Intravenous Q12H  . dextromethorphan-guaiFENesin  1 tablet Oral BID  . diltiazem  10 mg Intravenous Once  . furosemide  40 mg Oral BID  . insulin aspart  0-15 Units Subcutaneous TID WC  . insulin aspart  0-5 Units Subcutaneous QHS  . lisinopril  40 mg Oral Daily  . metoprolol tartrate  50 mg Oral QID  . potassium chloride  80 mEq Oral BID  . rosuvastatin  10 mg Oral q1800  . vancomycin  1,000 mg Intravenous Q12H  . warfarin  7.5 mg Oral ONCE-1800  . DISCONTD: metoprolol  50 mg Oral BID  . DISCONTD: piperacillin-tazobactam (ZOSYN)  IV  3.375 g Intravenous Q8H  . DISCONTD: vancomycin  2,500 mg Intravenous Once  . DISCONTD: vancomycin  1,000 mg Intravenous Q12H   Infusions:    . diltiazem (CARDIZEM) infusion 5 mg/hr (09/27/11 1811)  . heparin 1,000 Units/hr (09/27/11 1230)     Assessment: Heparin level undetectable. Bridging to r/o ACS. No complications.  Goal of Therapy:  Heparin level 0.3-0.7 units/ml   Plan:  1. Increase heparin drip to 1300 units/hr 2. Re-check 6hr heparin level   Ulyses Southward [place]ory 09/27/2011,6:40 PM

## 2011-09-27 NOTE — Progress Notes (Signed)
ANTICOAGULATION CONSULT NOTE - Follow Up Consult  Pharmacy Consult for Warfarin/adding heparin for INR < 2 Indication: Atrial Fibrillation  No Known Allergies  Patient Measurements: Height: 5\' 1"  (154.9 cm) Weight: 262 lb 9.6 oz (119.115 kg) IBW/kg (Calculated) : 47.8    Vital Signs: Temp: 98.8 F (37.1 C) (12/08 0800) Temp src: Oral (12/08 0800) BP: 145/84 mmHg (12/08 0810) Pulse Rate: 106  (12/08 0810)  Labs:  Basename 09/27/11 0500 09/26/11 0902 09/26/11 0901 09/26/11 0507 09/25/11 2356 09/25/11 1802 09/25/11 1600 09/25/11 0630  HGB -- -- 13.0 12.7 -- -- -- --  HCT -- -- 40.2 39.1 -- -- -- 42.5  PLT -- -- 288 299 -- -- -- 333  APTT -- -- -- -- -- -- -- --  LABPROT 20.3* -- -- 24.8* -- 24.1* -- --  INR 1.70* -- -- 2.20* -- 2.12* -- --  HEPARINUNFRC -- -- -- -- -- -- -- --  CREATININE 0.77 -- -- 0.71 -- -- 0.74 --  CKTOTAL -- 102 -- -- 114 -- 123 --  CKMB -- 3.4 -- -- 4.3* -- 4.5* --  TROPONINI -- 0.68* -- -- 1.21* -- 1.51* --   Estimated Creatinine Clearance: 82.2 ml/min (by C-G formula based on Cr of 0.77).   Medications:  Prescriptions prior to admission  Medication Sig Dispense Refill  . colchicine (COLCRYS) 0.6 MG tablet Take 0.6 mg by mouth daily as needed. For gout      . diltiazem (TIAZAC) 240 MG 24 hr capsule Take 240 mg by mouth daily.        . flecainide (TAMBOCOR) 100 MG tablet Take 1 tablet (100 mg total) by mouth 2 (two) times daily.  60 tablet  6  . HYDROcodone-acetaminophen (NORCO) 10-325 MG per tablet Take 1 tablet by mouth every 6 (six) hours as needed. For pain      . lisinopril (PRINIVIL,ZESTRIL) 40 MG tablet Take 40 mg by mouth daily.        . metoprolol (LOPRESSOR) 100 MG tablet Take 100 mg by mouth 2 (two) times daily.        . potassium chloride (K-DUR) 10 MEQ tablet       . pravastatin (PRAVACHOL) 80 MG tablet Take 80 mg by mouth daily.       Marland Kitchen triamterene-hydrochlorothiazide (MAXZIDE-25) 37.5-25 MG per tablet Take 1 tablet by mouth daily.         Marland Kitchen warfarin (COUMADIN) 5 MG tablet Take 2.5-5 mg by mouth daily. 1 tablet every day except take 1/2 tablet on wednesdays       Scheduled:     . ceFEPime (MAXIPIME) IV  2 g Intravenous Q12H  . dextromethorphan-guaiFENesin  1 tablet Oral BID  . furosemide  40 mg Oral BID  . insulin aspart  0-15 Units Subcutaneous TID WC  . insulin aspart  0-5 Units Subcutaneous QHS  . lisinopril  40 mg Oral Daily  . metoprolol tartrate  50 mg Oral QID  . potassium chloride  80 mEq Oral BID  . rosuvastatin  10 mg Oral q1800  . vancomycin  2,500 mg Intravenous Once  . vancomycin  1,000 mg Intravenous Q12H  . warfarin  5 mg Oral ONCE-1800  . DISCONTD: metoprolol  50 mg Oral BID  . DISCONTD: piperacillin-tazobactam  3.375 g Intravenous Q8H  . DISCONTD: piperacillin-tazobactam (ZOSYN)  IV  3.375 g Intravenous Q8H  . DISCONTD: vancomycin  2,500 mg Intravenous Once  . DISCONTD: vancomycin  1,000 mg Intravenous Q12H   Warfarin  History: PTA dose was 5 mg daily EXCEPT for 2.5 mg on Wednesdays only  Assessment: 70 y.o. F on warfarin for Afib with a subtherapeutic INR this a.m. No CBC today. No signs/symptoms of bleeding noted. Pt has been continued on her home dose thus far except dose held 12/6.  Pharmacy asked to begin IV heparin since INR now < 2.  Goal of Therapy:  INR 2-3   Plan:  1. Coumadin 7.5 mg x 1 tonight. F/U AM INR 2. Start IV heparin at 1000 units/hr.  Check heparin level 6 hrs after gtt starts. Daily heparin level and CBC.   Marijke Guadiana C 09/27/2011,10:25 AM

## 2011-09-27 NOTE — Progress Notes (Signed)
Patient ID: [name], female   DOB: Mar 17, 1941, 70 y.o.   MRN: [id number] SUBJECTIVE: She started having loose stools about 3 AM. She's had a total of 5. No abdominal pain. Most likely secondary to the antibiotics, perhaps Zosyn. Diureses well as noted below. Potassium 3.4 but receiving large supplement. She was to keep the Foley in. She is back in rapid A. fib at 130 beats per minute  Filed Vitals:   09/27/11 0600 09/27/11 0755 09/27/11 0800 09/27/11 0810  BP:  134/93  145/84  Pulse: 121 80 116 106  Temp:   98.8 F (37.1 C)   TempSrc:   Oral   Resp: 24 22 22 26   Height:      Weight:      SpO2: 95% 96% 97% 96%    Intake/Output Summary (Last 24 hours) at 09/27/11 0933 Last data filed at 09/27/11 0900  Gross per 24 hour  Intake   1330 ml  Output   3645 ml  Net  -2315 ml    LABS: Basic Metabolic Panel:  Basename 09/27/11 0500 09/26/11 0507  NA 139 138  K 3.4* 2.8*  CL 99 97  CO2 29 31  GLUCOSE 192* 186*  BUN 23 23  CREATININE 0.77 0.71  CALCIUM 8.8 9.0  MG -- --  PHOS -- --   Liver Function Tests: No results found for this basename: AST:2,ALT:2,ALKPHOS:2,BILITOT:2,PROT:2,ALBUMIN:2 in the last 72 hours No results found for this basename: LIPASE:2,AMYLASE:2 in the last 72 hours CBC:  Basename 09/26/11 0901 09/26/11 0507  WBC 14.9* 16.1*  NEUTROABS -- --  HGB 13.0 12.7  HCT 40.2 39.1  MCV 84.1 84.1  PLT 288 299   Cardiac Enzymes:  Basename 09/26/11 0902 09/25/11 2356 09/25/11 1600  CKTOTAL 102 114 123  CKMB 3.4 4.3* 4.5*  CKMBINDEX -- -- --  TROPONINI 0.68* 1.21* 1.51*   BNP:  Basename 09/25/11 0630  POCBNP 609.2*   D-Dimer: No results found for this basename: DDIMER:2 in the last 72 hours Hemoglobin A1C: No results found for this basename: HGBA1C in the last 72 hours Fasting Lipid Panel: No results found for this basename: CHOL,HDL,LDLCALC,TRIG,CHOLHDL,LDLDIRECT in the last 72 hours Thyroid Function Tests: No results found for this basename:  TSH,T4TOTAL,FREET3,T3FREE,THYROIDAB in the last 72 hours Anemia Panel: No results found for this basename: VITAMINB12,FOLATE,FERRITIN,TIBC,IRON,RETICCTPCT in the last 72 hours  RADIOLOGY: Dg Chest 2 View  09/23/2011  *RADIOLOGY REPORT*  Clinical Data: Follow up pleural effusion  CHEST - 2 VIEW  Comparison: 09/19/2011  Findings: Heart size is moderately enlarged.  There is a scar within the right midlung which is unchanged from previous exam.  Pulmonary venous congestion appears improved from previous exam.  There is no pleural effusion noted.  IMPRESSION:  1.  Improvement in pulmonary venous congestion.  Original Report Authenticated By: Rosealee Albee, M.D.   Dg Chest 2 View  09/19/2011  *RADIOLOGY REPORT*  Clinical Data: Shortness of breath and right neck pain.  CHEST - 2 VIEW  Comparison: Chest radiograph performed 10/14/2010  Findings: The lungs are well-aerated.  Vascular congestion is noted, with diffusely increased interstitial markings, most compatible with pulmonary edema.  No significant pleural effusion or pneumothorax is seen.  The heart is mildly enlarged; calcification is noted within the aortic arch.  No acute osseous abnormalities are seen.  IMPRESSION: Vascular congestion and mild cardiomegaly, with diffusely increased interstitial markings, most compatible with pulmonary edema.  Original Report Authenticated By: Tonia Ghent, M.D.   Dg Chest [facility]  09/25/2011  *RADIOLOGY REPORT*  Clinical Data: Shortness of breath.  PORTABLE CHEST - 1 VIEW  Comparison: Chest radiograph performed 09/23/2011  Findings: The lungs are well-aerated.  Diffuse bilateral airspace opacification is noted, with sparing of the lung apices.  This is most prominent at the right perihilar region, and may reflect significant multifocal pneumonia or pulmonary edema.  A small right pleural effusion is noted.  No pneumothorax is seen.  Underlying vascular congestion is appreciated.  The cardiomediastinal  silhouette is mildly enlarged.  No acute osseous abnormalities are seen.  IMPRESSION:  1.  Diffuse bilateral airspace opacification, with sparing of the lung apices.  This is most prominent at the right perihilar region, and may reflect significant multifocal pneumonia or pulmonary edema.  Small right pleural effusion noted. 2.  Underlying vascular congestion and mild cardiomegaly.  Original Report Authenticated By: Tonia Ghent, M.D.    PHYSICAL EXAM General: Well developed, well nourished, in no acute distress, morbidly obese Head: Eyes PERRLA, No xanthomas.   Normal cephalic and atramatic  Lungs: Clear bilaterally to auscultation and percussion. Heart: Irregular rapid rate and rhythm S1 S2,   Pulses are 2+ & equal.            No carotid bruit. No JVD.  No abdominal bruits. No femoral bruits. Abdomen: Bowel sounds are positive, abdomen soft and nontender                Msk:  Back normal, normal gait. Normal strength and tone for age. Extremities: No clubbing, cyanosis or edema.  DP +1 Neuro: Alert and oriented X 3. Psych:  Good affect, responds appropriately  TELEMETRY: Reviewed telemetry pt in A. fib with rapid ventricular rate  ASSESSMENT AND PLAN:  Principal Problem:  *Atrial flutter Active Problems:  DM  HYPERLIPIDEMIA  GOUT  OBESITY, MORBID  OBSTRUCTIVE SLEEP APNEA  HYPERTENSION  ATRIAL FIBRILLATION, PAROXYSMAL  DIASTOLIC HEART FAILURE, ACUTE ON CHRONIC  Lumbar strain  Hypokalemia  Ejection fraction  Back pain  Pleural effusion   She is back in atrial fib with a rapid ventricular rate. She's also having watery diarrhea starting this morning at 3 AM. Potassium 3.4 being supplemented. Good diuresis. I have spoken to the pharmacy and we have changed her Zosyn to  cefepime. We'll continue vancomycin. Also start Imodium. We'll keep Foley in until diarrhea resolves. Check bmet in a.m. Will increase metoprolol to 50 mg 4 times a day for rate control  Valera Castle,  MD 09/27/2011 9:33 AM

## 2011-09-28 LAB — CBC
HCT: 41.1 % (ref 36.0–46.0)
Hemoglobin: 13.7 g/dL (ref 12.0–15.0)
Hemoglobin: 13.6 g/dL (ref 12.0–15.0)
MCH: 27.2 pg (ref 26.0–34.0)
Platelets: 353 10*3/uL (ref 150–400)
RBC: 4.92 MIL/uL (ref 3.87–5.11)
RBC: 5 MIL/uL (ref 3.87–5.11)
WBC: 10.1 10*3/uL (ref 4.0–10.5)

## 2011-09-28 LAB — BASIC METABOLIC PANEL
CO2: 28 mEq/L (ref 19–32)
Calcium: 9.3 mg/dL (ref 8.4–10.5)
Chloride: 102 mEq/L (ref 96–112)
GFR calc Af Amer: >90 mL/min (ref >90)
GFR calc non Af Amer: 87 mL/min — ABNORMAL LOW (ref >90)
GFR calc non Af Amer: 88 mL/min — ABNORMAL LOW (ref >90)
Glucose, Bld: 174 mg/dL — ABNORMAL HIGH (ref 70–99)
Potassium: 3.8 mEq/L (ref 3.5–5.1)
Potassium: 3.8 mEq/L (ref 3.5–5.1)
Sodium: 139 mEq/L (ref 135–145)
Sodium: 141 mEq/L (ref 135–145)

## 2011-09-28 LAB — HEPARIN LEVEL (UNFRACTIONATED): Heparin Unfractionated: 0.24 IU/mL — ABNORMAL LOW (ref 0.30–0.70)

## 2011-09-28 LAB — GLUCOSE, CAPILLARY
Glucose-Capillary: 144 mg/dL — ABNORMAL HIGH (ref 70–99)
Glucose-Capillary: 159 mg/dL — ABNORMAL HIGH (ref 70–99)
Glucose-Capillary: 161 mg/dL — ABNORMAL HIGH (ref 70–99)

## 2011-09-28 LAB — CLOSTRIDIUM DIFFICILE BY PCR: Toxigenic C. Difficile by PCR: NEGATIVE

## 2011-09-28 MED ORDER — FUROSEMIDE 10 MG/ML IJ SOLN
40.0000 mg | Freq: Two times a day (BID) | INTRAMUSCULAR | Status: DC
  Administered 2011-09-28 – 2011-10-02 (×9): 40 mg via INTRAVENOUS
  Filled 2011-09-28 (×12): qty 4

## 2011-09-28 MED ORDER — WARFARIN SODIUM 7.5 MG PO TABS
7.5000 mg | ORAL_TABLET | Freq: Once | ORAL | Status: AC
  Administered 2011-09-28: 7.5 mg via ORAL
  Filled 2011-09-28: qty 1

## 2011-09-28 MED ORDER — DILTIAZEM HCL 60 MG PO TABS
60.0000 mg | ORAL_TABLET | Freq: Four times a day (QID) | ORAL | Status: DC
  Administered 2011-09-28 – 2011-10-01 (×11): 60 mg via ORAL
  Filled 2011-09-28 (×17): qty 1

## 2011-09-28 MED ORDER — IBUPROFEN 100 MG/5ML PO SUSP
600.0000 mg | Freq: Four times a day (QID) | ORAL | Status: DC
  Administered 2011-09-29 – 2011-10-04 (×2): 600 mg via ORAL
  Filled 2011-09-28 (×32): qty 30

## 2011-09-28 NOTE — Progress Notes (Signed)
Patient ID: [name], female   DOB: [birth year], 70 y.o.   MRN: [id number] SUBJECTIVE: Diarrhea persists but has slowed down. C diff sent. No Abs PTA. Still has nagging cough. Net positive I and O yesterday but cannot account for diarrhea. Has hx of gout and Left big toe beginning to hurt. Has low grade fever and WBC mildly elevated.  Filed Vitals:   09/28/11 0005 09/28/11 0400 09/28/11 0430 09/28/11 0600  BP: 122/46 127/80  117/69  Pulse: 95 131  79  Temp: 98.9 F (37.2 C) 98.8 F (37.1 C)    TempSrc: Oral Oral    Resp: 26 23  21   Height:      Weight:   117.9 kg (259 lb 14.8 oz)   SpO2: 96% 97%  97%    Intake/Output Summary (Last 24 hours) at 09/28/11 0901 Last data filed at 09/28/11 0700  Gross per 24 hour  Intake 988.42 ml  Output    800 ml  Net 188.42 ml    LABS: Basic Metabolic Panel:  Basename 09/28/11 0154 09/27/11 0500  NA 139 139  K 3.8 3.4*  CL 102 99  CO2 27 29  GLUCOSE 183* 192*  BUN 22 23  CREATININE 0.72 0.77  CALCIUM 9.0 8.8  MG -- --  PHOS -- --   Liver Function Tests: No results found for this basename: AST:2,ALT:2,ALKPHOS:2,BILITOT:2,PROT:2,ALBUMIN:2 in the last 72 hours No results found for this basename: LIPASE:2,AMYLASE:2 in the last 72 hours CBC:  Basename 09/28/11 0154 09/27/11 1542  WBC 11.3* 11.4*  NEUTROABS -- --  HGB 13.7 13.5  HCT 41.1 40.9  MCV 83.5 83.8  PLT 324 312   Cardiac Enzymes:  Basename 09/26/11 0902 09/25/11 2356 09/25/11 1600  CKTOTAL 102 114 123  CKMB 3.4 4.3* 4.5*  CKMBINDEX -- -- --  TROPONINI 0.68* 1.21* 1.51*   BNP: No results found for this basename: POCBNP:3 in the last 72 hours D-Dimer: No results found for this basename: DDIMER:2 in the last 72 hours Hemoglobin A1C: No results found for this basename: HGBA1C in the last 72 hours Fasting Lipid Panel: No results found for this basename: CHOL,HDL,LDLCALC,TRIG,CHOLHDL,LDLDIRECT in the last 72 hours Thyroid Function Tests: No results found for this  basename: TSH,T4TOTAL,FREET3,T3FREE,THYROIDAB in the last 72 hours Anemia Panel: No results found for this basename: VITAMINB12,FOLATE,FERRITIN,TIBC,IRON,RETICCTPCT in the last 72 hours  RADIOLOGY: Dg Chest 2 View  09/23/2011  *RADIOLOGY REPORT*  Clinical Data: Follow up pleural effusion  CHEST - 2 VIEW  Comparison: 09/19/2011  Findings: Heart size is moderately enlarged.  There is a scar within the right midlung which is unchanged from previous exam.  Pulmonary venous congestion appears improved from previous exam.  There is no pleural effusion noted.  IMPRESSION:  1.  Improvement in pulmonary venous congestion.  Original Report Authenticated By: Rosealee Albee, M.D.   Dg Chest 2 View  09/19/2011  *RADIOLOGY REPORT*  Clinical Data: Shortness of breath and right neck pain.  CHEST - 2 VIEW  Comparison: Chest radiograph performed 10/14/2010  Findings: The lungs are well-aerated.  Vascular congestion is noted, with diffusely increased interstitial markings, most compatible with pulmonary edema.  No significant pleural effusion or pneumothorax is seen.  The heart is mildly enlarged; calcification is noted within the aortic arch.  No acute osseous abnormalities are seen.  IMPRESSION: Vascular congestion and mild cardiomegaly, with diffusely increased interstitial markings, most compatible with pulmonary edema.  Original Report Authenticated By: Tonia Ghent, M.D.   Dg Chest [facility]  09/25/2011  *RADIOLOGY REPORT*  Clinical Data: Shortness of breath.  PORTABLE CHEST - 1 VIEW  Comparison: Chest radiograph performed 09/23/2011  Findings: The lungs are well-aerated.  Diffuse bilateral airspace opacification is noted, with sparing of the lung apices.  This is most prominent at the right perihilar region, and may reflect significant multifocal pneumonia or pulmonary edema.  A small right pleural effusion is noted.  No pneumothorax is seen.  Underlying vascular congestion is appreciated.  The  cardiomediastinal silhouette is mildly enlarged.  No acute osseous abnormalities are seen.  IMPRESSION:  1.  Diffuse bilateral airspace opacification, with sparing of the lung apices.  This is most prominent at the right perihilar region, and may reflect significant multifocal pneumonia or pulmonary edema.  Small right pleural effusion noted. 2.  Underlying vascular congestion and mild cardiomegaly.  Original Report Authenticated By: Tonia Ghent, M.D.    PHYSICAL EXAM General: Well developed, well nourished, in no acute distress, morbidly obese Head: Eyes PERRLA, No xanthomas.   Normal cephalic and atramatic  Lungs: decreased BS's in bases Heart: H IRRR S1 S2,   Pulses are 2+ & equal.            No carotid bruit. No JVD.  No abdominal bruits. No femoral bruits. Abdomen: Bowel sounds are positive, abdomen soft and non-tender without masses or                  Hernia's noted. Msk:  Back normal, normal gait. Normal strength and tone for age. Extremities: No clubbing, cyanosis or edema.  DP +1, tenderness of left first MTP joint Neuro: Alert and oriented X 3. Psych:  Good affect, responds appropriately  TELEMETRY: Reviewed telemetry pt in AFib with better control of VR:  ASSESSMENT AND PLAN:  Recurrent AFib yet VR better controlled with increased metoprolol, anticoagulation per Pharmacy Possible pnuemonia ACDCHF with minimal diuresis Early gout left big toe Morbid Obesity DM2 OSA  Will change Lasix to IV, continue Abs for now, check CXR in the AM, begin NSAID for gout, check Cdiff, continue anticoagulation.   Valera Castle, MD 09/28/2011 9:01 AM

## 2011-09-28 NOTE — Progress Notes (Signed)
ANTICOAGULATION CONSULT NOTE - Follow Up Consult  Pharmacy Consult for heparin/coumadin/ vancomycin Indication: atrial fibrillation/ ?HCAP  No Known Allergies  Patient Measurements: Height: 5\' 1"  (154.9 cm) Weight: 259 lb 14.8 oz (117.9 kg) IBW/kg (Calculated) : 47.8  Adjusted Body Weight:   Vital Signs: Temp: 98.8 F (37.1 C) (12/09 0400) Temp src: Oral (12/09 0400) BP: 117/69 mmHg (12/09 0600) Pulse Rate: 79  (12/09 0600)  Labs:  Basename 09/28/11 0904 09/28/11 0154 09/27/11 1542 09/27/11 0500 09/26/11 0902 09/26/11 0901 09/26/11 0507 09/25/11 2356 09/25/11 1600  HGB -- 13.7 13.5 -- -- -- -- -- --  HCT -- 41.1 40.9 -- -- 40.2 -- -- --  PLT -- 324 312 -- -- 288 -- -- --  APTT -- -- -- -- -- -- -- -- --  LABPROT -- 19.4* -- 20.3* -- -- 24.8* -- --  INR -- 1.61* -- 1.70* -- -- 2.20* -- --  HEPARINUNFRC 0.33 0.24* <0.10* -- -- -- -- -- --  CREATININE -- 0.72 -- 0.77 -- -- 0.71 -- --  CKTOTAL -- -- -- -- 102 -- -- 114 123  CKMB -- -- -- -- 3.4 -- -- 4.3* 4.5*  TROPONINI -- -- -- -- 0.68* -- -- 1.21* 1.51*   Estimated Creatinine Clearance: 81.7 ml/min (by C-G formula based on Cr of 0.72).   Medications:  Scheduled:     . ceFEPime (MAXIPIME) IV  2 g Intravenous Q12H  . dextromethorphan-guaiFENesin  1 tablet Oral BID  . diltiazem  10 mg Intravenous Once  . diltiazem  60 mg Oral Q6H  . furosemide  40 mg Intravenous BID  . ibuprofen  600 mg Oral Q6H  . insulin aspart  0-15 Units Subcutaneous TID WC  . insulin aspart  0-5 Units Subcutaneous QHS  . lisinopril  40 mg Oral Daily  . metoprolol tartrate  50 mg Oral QID  . potassium chloride  80 mEq Oral BID  . rosuvastatin  10 mg Oral q1800  . vancomycin  1,000 mg Intravenous Q12H  . warfarin  7.5 mg Oral ONCE-1800  . warfarin  7.5 mg Oral ONCE-1800  . DISCONTD: furosemide  40 mg Oral BID   Infusions:     . sodium chloride 10 mL/hr at 09/27/11 2115  . heparin 1,450 Units/hr (09/28/11 0609)  . DISCONTD: diltiazem  (CARDIZEM) infusion 10 mg/hr (09/28/11 0347)  . DISCONTD: heparin 1,000 Units/hr (09/27/11 1230)    Assessment: 70yo female on IV heparin and Coumadin for afib.  Heparin level therapeutic on 1450 units/hr.  INR trending down despite doses larger than home dose.  Likely due to Coumadin dosing held 12/6.  12/7 and 12/8 doses charted as given.  Also on IV vancomycin 1g q12 empiric for suspected PNA.  Goal of Therapy:  Heparin level 0.3-0.7 units/ml INR 2-3 Vancomycin trough 15-20  Plan:  1. Continue IV heparin at current rate. F/U AM heparin level. 2. Coumadin 7.5 mg x 1 tonight, f/u AM INR. 3. No change to vancomycin for now, will consider trough level soon.  CarneyGwenlyn Found PharmD BCPS 09/28/2011,10:43 AM

## 2011-09-28 NOTE — Progress Notes (Signed)
ANTICOAGULATION CONSULT NOTE - Follow Up Consult  Pharmacy Consult for heparin Indication: atrial fibrillation  No Known Allergies  Patient Measurements: Height: 5\' 1"  (154.9 cm) Weight: 262 lb 9.6 oz (119.115 kg) IBW/kg (Calculated) : 47.8  Adjusted Body Weight:   Vital Signs: Temp: 98.9 F (37.2 C) (12/09 0005) Temp src: Oral (12/09 0005) BP: 122/46 mmHg (12/09 0005) Pulse Rate: 95  (12/09 0005)  Labs:  Basename 09/28/11 0154 09/27/11 1542 09/27/11 0500 09/26/11 0902 09/26/11 0901 09/26/11 0507 09/25/11 2356 09/25/11 1600  HGB 13.7 13.5 -- -- -- -- -- --  HCT 41.1 40.9 -- -- 40.2 -- -- --  PLT 324 312 -- -- 288 -- -- --  APTT -- -- -- -- -- -- -- --  LABPROT 19.4* -- 20.3* -- -- 24.8* -- --  INR 1.61* -- 1.70* -- -- 2.20* -- --  HEPARINUNFRC 0.24* <0.10* -- -- -- -- -- --  CREATININE -- -- 0.77 -- -- 0.71 -- 0.74  CKTOTAL -- -- -- 102 -- -- 114 123  CKMB -- -- -- 3.4 -- -- 4.3* 4.5*  TROPONINI -- -- -- 0.68* -- -- 1.21* 1.51*   Estimated Creatinine Clearance: 82.2 ml/min (by C-G formula based on Cr of 0.77).   Medications:  Scheduled:     . ceFEPime (MAXIPIME) IV  2 g Intravenous Q12H  . dextromethorphan-guaiFENesin  1 tablet Oral BID  . diltiazem  10 mg Intravenous Once  . furosemide  40 mg Oral BID  . insulin aspart  0-15 Units Subcutaneous TID WC  . insulin aspart  0-5 Units Subcutaneous QHS  . lisinopril  40 mg Oral Daily  . metoprolol tartrate  50 mg Oral QID  . potassium chloride  80 mEq Oral BID  . rosuvastatin  10 mg Oral q1800  . vancomycin  1,000 mg Intravenous Q12H  . warfarin  7.5 mg Oral ONCE-1800  . DISCONTD: metoprolol  50 mg Oral BID  . DISCONTD: piperacillin-tazobactam (ZOSYN)  IV  3.375 g Intravenous Q8H  . DISCONTD: vancomycin  2,500 mg Intravenous Once  . DISCONTD: vancomycin  1,000 mg Intravenous Q12H   Infusions:     . sodium chloride 10 mL/hr at 09/27/11 2115  . diltiazem (CARDIZEM) infusion 7.5 mg/hr (09/27/11 1944)  . heparin  1,300 Units/hr (09/27/11 1850)  . DISCONTD: heparin 1,000 Units/hr (09/27/11 1230)    Assessment: 69yo female remains undetectable on heparin after rate increase though now detectable.  Goal of Therapy:  Heparin level 0.3-0.7 units/ml   Plan:  Will increase gtt by 2 units/kg/hr to 1450 units/hr and check level in 6hr.  Colleen Can PharmD BCPS 09/28/2011,2:52 AM

## 2011-09-29 ENCOUNTER — Inpatient Hospital Stay (HOSPITAL_COMMUNITY): Payer: Medicare Other

## 2011-09-29 DIAGNOSIS — R059 Cough, unspecified: Secondary | ICD-10-CM

## 2011-09-29 DIAGNOSIS — J810 Acute pulmonary edema: Secondary | ICD-10-CM

## 2011-09-29 DIAGNOSIS — R05 Cough: Secondary | ICD-10-CM

## 2011-09-29 DIAGNOSIS — R197 Diarrhea, unspecified: Secondary | ICD-10-CM

## 2011-09-29 DIAGNOSIS — J189 Pneumonia, unspecified organism: Secondary | ICD-10-CM

## 2011-09-29 LAB — CBC
HCT: 40.4 % (ref 36.0–46.0)
MCH: 26.7 pg (ref 26.0–34.0)
MCHC: 31.9 g/dL (ref 30.0–36.0)
MCV: 83.5 fL (ref 78.0–100.0)
Platelets: 345 10*3/uL (ref 150–400)
RDW: 15.3 % (ref 11.5–15.5)
WBC: 10.4 10*3/uL (ref 4.0–10.5)

## 2011-09-29 LAB — HEPARIN LEVEL (UNFRACTIONATED): Heparin Unfractionated: 0.61 IU/mL (ref 0.30–0.70)

## 2011-09-29 LAB — PROTIME-INR: INR: 1.78 — ABNORMAL HIGH (ref 0.00–1.49)

## 2011-09-29 LAB — GLUCOSE, CAPILLARY: Glucose-Capillary: 132 mg/dL — ABNORMAL HIGH (ref 70–99)

## 2011-09-29 MED ORDER — FAMOTIDINE 20 MG PO TABS
20.0000 mg | ORAL_TABLET | Freq: Two times a day (BID) | ORAL | Status: AC
  Administered 2011-09-29 – 2011-10-04 (×10): 20 mg via ORAL
  Filled 2011-09-29 (×10): qty 1

## 2011-09-29 MED ORDER — AMIODARONE HCL 200 MG PO TABS
400.0000 mg | ORAL_TABLET | Freq: Three times a day (TID) | ORAL | Status: DC
  Administered 2011-09-29 – 2011-09-30 (×5): 400 mg via ORAL
  Filled 2011-09-29 (×9): qty 2

## 2011-09-29 MED ORDER — FLORA-Q PO CAPS
1.0000 | ORAL_CAPSULE | Freq: Two times a day (BID) | ORAL | Status: AC
  Administered 2011-09-30 – 2011-10-04 (×9): 1 via ORAL
  Filled 2011-09-29 (×14): qty 1

## 2011-09-29 MED ORDER — WARFARIN SODIUM 7.5 MG PO TABS
7.5000 mg | ORAL_TABLET | Freq: Once | ORAL | Status: AC
  Administered 2011-09-29: 7.5 mg via ORAL
  Filled 2011-09-29 (×2): qty 1

## 2011-09-29 MED ORDER — HYDROCOD POLST-CHLORPHEN POLST 10-8 MG/5ML PO LQCR
5.0000 mL | Freq: Two times a day (BID) | ORAL | Status: DC | PRN
  Administered 2011-09-29 – 2011-10-05 (×8): 5 mL via ORAL
  Filled 2011-09-29 (×8): qty 5

## 2011-09-29 MED ORDER — WARFARIN SODIUM 7.5 MG PO TABS: 7.5000 mg | ORAL_TABLET | Freq: Once | ORAL | Status: DC

## 2011-09-29 NOTE — Progress Notes (Signed)
UR Completed.   [name] 09/29/2011 [phone]  

## 2011-09-29 NOTE — Progress Notes (Signed)
ANTICOAGULATION CONSULT NOTE - Follow Up Consult  Pharmacy Consult for heparin/coumadin Indication: atrial fibrillation  Assessment: 70yo female on IV heparin and Coumadin for Afib (on coumadin PTA).  Heparin level therapeutic on 1450 units/hr.  INR sub therapeutic but slowly trending up.  Goal of Therapy:  Heparin level 0.3-0.7 units/ml INR 2-3  Plan:  1. Continue IV heparin at current rate of 1450 units/hr 2. Will check 6-hour HL to confirm therapeutic and then follow-up daily HL. 3. Coumadin 7.5 mg x 1 tonight, f/u AM INR.  Benjaman Pott, PharmD Pager [phone]  09/29/2011 9:21 AM     No Known Allergies  Patient Measurements: Height: 5\' 1"  (154.9 cm) Weight: 259 lb 14.8 oz (117.9 kg) IBW/kg (Calculated) : 47.8   Vital Signs: Temp: 97.8 F (36.6 C) (12/10 0800) Temp src: Oral (12/10 0800) BP: 122/76 mmHg (12/10 0800) Pulse Rate: 100  (12/10 0800)  Labs:  Basename 09/29/11 0520 09/28/11 1111 09/28/11 0904 09/28/11 0154 09/27/11 0500 09/26/11 0902  HGB 12.9 13.6 -- -- -- --  HCT 40.4 42.3 -- 41.1 -- --  PLT 345 353 -- 324 -- --  APTT -- -- -- -- -- --  LABPROT 21.0* -- -- 19.4* 20.3* --  INR 1.78* -- -- 1.61* 1.70* --  HEPARINUNFRC 0.61 -- 0.33 0.24* -- --  CREATININE -- 0.69 -- 0.72 0.77 --  CKTOTAL -- -- -- -- -- 102  CKMB -- -- -- -- -- 3.4  TROPONINI -- -- -- -- -- 0.68*   Estimated Creatinine Clearance: 81.7 ml/min (by C-G formula based on Cr of 0.69).   Medications:  Scheduled:     . ceFEPime (MAXIPIME) IV  2 g Intravenous Q12H  . dextromethorphan-guaiFENesin  1 tablet Oral BID  . diltiazem  60 mg Oral Q6H  . furosemide  40 mg Intravenous BID  . ibuprofen  600 mg Oral Q6H  . insulin aspart  0-15 Units Subcutaneous TID WC  . insulin aspart  0-5 Units Subcutaneous QHS  . lisinopril  40 mg Oral Daily  . metoprolol tartrate  50 mg Oral QID  . potassium chloride  80 mEq Oral BID  . rosuvastatin  10 mg Oral q1800  . vancomycin  1,000 mg  Intravenous Q12H  . warfarin  7.5 mg Oral ONCE-1800  . DISCONTD: furosemide  40 mg Oral BID   Infusions:     . sodium chloride 10 mL/hr at 09/27/11 2115  . heparin 1,450 Units/hr (09/28/11 2343)  . DISCONTD: diltiazem (CARDIZEM) infusion Stopped (09/28/11 1200)    9:02 AM 09/29/2011

## 2011-09-29 NOTE — Progress Notes (Signed)
ANTICOAGULATION CONSULT NOTE - Follow Up Consult  Pharmacy Consult for heparin Indication: atrial fibrillation  Assessment: 70yo female on IV heparin and Coumadin for Afib (on coumadin PTA).  Repeat  Heparin level 0.49 on 1450 units/hr.  Goal of Therapy:  Heparin level 0.3-0.7 units/ml INR 2-3  Plan:  1. Continue IV heparin at current rate of 1450 units/hr, am HL, CBC 2. Coumadin 7.5 mg x 1 tonight, f/u AM INR. 09/29/2011 5:09 PM

## 2011-09-29 NOTE — Consult Note (Signed)
PULMONARY/CCM CONSULT NOTE  Requesting MD/Service: Graciela Husbands [facility] Cards Date of admission:  11/30 Date of consult: 12/10 Reason for consultation: Possible PNA, direction on antibiotics  HPI:  46 yohf recently discharged from [facility] after DCCV for AF. Readmitted 11/30 with recurrent AF/flutter, CHF, dysnea. Underwent flutter ablation 12/5. 12/6 developed marked dyspnea, cough with scant discolored mucus, diffuse bilateral pulmonary infiltrates and low grade fever with leukocytosis. She improved substantially with diuresis. She was initially started on levofloxacin for possible multilobar PNA. Abx were subsequently expanded to Vanc/Zosyn, then the Zosyn was changed to Cefepime. She has also developed significant diarrhea with C diff negative X 2. Presently, her respiratory status is nearly back to baseline. She remains in AF. She continues to have a minimally productive cough with clear mucus and fecal incontinence with coughing. She has been afebrile X several days and her leukocytosis has resolved. She had scant hemoptysis at the time of her severe distress on 12/6 but none since. She denies N/V, dysuria and LE edema   Past Medical History  Diagnosis Date  . Atrial fibrillation /flutter   . Hypertension   . Diabetes mellitus   . Hyperlipidemia   . Gout   . Atrial flutter     dccv: 08/2011 - on Flecainide/coumadin  . Diastolic CHF, chronic     echo 2006 - ef 55-65%; mild diast dysfxn  . Arthritis   . Morbid obesity   . OSA (obstructive sleep apnea)     uses cpap  . CHF (congestive heart failure)   . Ejection fraction     EF 60%, echo, September 19, 2011  . Back pain     MEDICATIONS: reviewed - include Vanc, Cefepime, Lisinopril, Guaifenesin  History   Social History  . Marital Status: Married    Spouse Name: N/A    Number of Children: Y  . Years of Education: N/A   Occupational History  . DISABILITY/housewife    Social History Main Topics  . Smoking status: Never Smoker   .  Smokeless tobacco: Never Used  . Alcohol Use: No  . Drug Use: No  . Sexually Active: Not Currently    Birth Control/ Protection: Post-menopausal   Other Topics Concern  . Not on file   Social History Narrative  . No narrative on file    Family History  Problem Relation Age of Onset  . Heart disease Father   . Breast cancer Sister   . Cancer Sister     breast    ROS - as per HPI. Otherwise negative  Filed Vitals:   09/29/11 0000 09/29/11 0400 09/29/11 0800 09/29/11 1152  BP: 114/70 107/56 122/76 123/81  Pulse:   100 98  Temp: 98.2 F (36.8 C) 98.3 F (36.8 C) 97.8 F (36.6 C) 97.5 F (36.4 C)  TempSrc: Oral Oral Oral Oral  Resp: 22 21    Height:      Weight:      SpO2: 95% 93% 93% 94%    EXAM:  Gen: No overt resp distress, pleasant and comfortable onRA HEENT: WNL Neck: No JVD noted Lungs:  Minimal bibasilar rales, no findings of consolidation, no wheezes Cardiovascular:  IRIR, no M noted Abdomen: obese, soft, NT, + BS Musculoskeletal: No C/C/E Neuro: intact Skin: normal  DATA: BMET    Component Value Date/Time   NA 141 09/28/2011 1111   K 3.8 09/28/2011 1111   CL 102 09/28/2011 1111   CO2 28 09/28/2011 1111   GLUCOSE 174* 09/28/2011 1111  BUN 20 09/28/2011 1111   CREATININE 0.69 09/28/2011 1111   CALCIUM 9.3 09/28/2011 1111   GFRNONAA 88* 09/28/2011 1111   GFRAA >90 09/28/2011 1111    CBC    Component Value Date/Time   WBC 10.4 09/29/2011 0520   RBC 4.84 09/29/2011 0520   HGB 12.9 09/29/2011 0520   HCT 40.4 09/29/2011 0520   PLT 345 09/29/2011 0520   MCV 83.5 09/29/2011 0520   MCH 26.7 09/29/2011 0520   MCHC 31.9 09/29/2011 0520   RDW 15.3 09/29/2011 0520   LYMPHSABS 3.1 09/10/2011 1404   MONOABS 1.8* 09/10/2011 1404   EOSABS 0.2 09/10/2011 1404   BASOSABS 0.1 09/10/2011 1404    RADIOLOGY:  CXR 11/30: mild interstitial edema CXR 12/6: marked bilateral AS dz c/w edema CXR 12/10: marked clearing of bilat infiltrates - essentially full  resolution  IMPRESSION:   Acute Resp distress, pulmonary infiltrates, now resolved - It was certainly reasonable to treat the events of 12/6 as HAP at the time but the complete resolution of infiltrates on her current film can only be explained by resolution of pulmonary edema. Therefore, I doubt she had PNA and therefore abx can be safely stopped. I have written to D/C Vanc and cefepime  Cough - Perhaps related to CHF though CXR now shows little evidence of ongoing edema. I note that she is on an ACEI which she has tolerated well prior to admission. Nonetheless, it might be reasonable to switch her to an ARB given the persistent cough. She is also at high risk for GERD given her body habitus and her bedbound, recumbent position might be exacerbating this (as well as the Ca channel blocker). Will begin Pepcid (avoid PPI which can exacerbate diarrhea) which does not necessarily have to be continued after she is up and about (i.e after discharge). I have also ordered Tussionex for symptomatic relief (as an opioid, this might help with the diarrhea as well).  Diarrhea C diff negative. Likely abx related, C diff negative diarrhea. Add probiotics  Will see again as needed. Please call if we can be of further assistance  Billy Fischer, MD;  PCCM service; Mobile [phone]

## 2011-09-29 NOTE — Progress Notes (Signed)
Patient Name: [name]    ASSESSMENT AND PLAN Patient Active Hospital Problem List: Atrial flutter (09/12/2011)   Assessment:  S/p RFCA   Plan: continue current regime DIASTOLIC HEART FAILURE, ACUTE ON CHRONIC (02/20/2010)   Assessment: CXR much improved    Plan: continue diurettics HAP (hospital-acquired pneumonia) (09/29/2011)   Assessment: not sure    Plan: will ask pulm to see for opinion and recs re abx Diarrhea (09/29/2011)   Assessment: still problem   Plan: CDiff neg but will reorder and ask GI for help ATRIAL FIBRILLATION, PAROXYSMAL (05/16/2009)   Assessment: recurrent    Plan: will initiate amiodarone and anticipate cardioversion end of week Resume coumadin  NSTEMI : pt had pk tropponi of 1.5 temporally related to RFCA  That seems a little high  Last myoview is 2006 that I can find so will not use flecanide  GOUT (05/16/2009)   Assessment: stable   Plan: continue ibuprofen for right now      SUBJECTIVE:improved breathing the big issue is the diarrhea which is frequent and embarressing  Toe is not hurting      PHYSICAL EXAM Filed Vitals:   09/29/11 0000 09/29/11 0400 09/29/11 0800 09/29/11 1152  BP: 114/70 107/56 122/76 123/81  Pulse:   100 98  Temp: 98.2 F (36.8 C) 98.3 F (36.8 C) 97.8 F (36.6 C) 97.5 F (36.4 C)  TempSrc: Oral Oral Oral Oral  Resp: 22 21    Height:      Weight:      SpO2: 95% 93% 93% 94%    General appearance: alert, appears stated age, no distress and morbidly obese Neck: no JVD Lungs: clear to auscultation bilaterally Heart: irregularly irregular rhythm Abdomen: soft, non-tender; bowel sounds normal; no masses,  no organomegaly Extremities: extremities normal, atraumatic, no cyanosis or edema Pulses: 2+ and symmetric Skin: Skin color, texture, turgor normal. No rashes or lesions Neurologic: Alert and oriented X 3, normal strength and tone. Normal symmetric reflexes. Normal coordination and gait  TELEMETRY: Reviewed  telemetry pt in  afib with rvr:    Intake/Output Summary (Last 24 hours) at 09/29/11 1317 Last data filed at 09/29/11 1200  Gross per 24 hour  Intake 1968.5 ml  Output   1557 ml  Net  411.5 ml    LABS: Basic Metabolic Panel:  Lab 09/28/11 1610 09/28/11 0154 09/27/11 0500 09/26/11 0507 09/25/11 1600 09/25/11 0630 09/24/11 0540  NA 141 139 139 138 137 135 141  K 3.8 3.8 3.4* 2.8* 3.8 5.4* 3.9  CL 102 102 99 97 101 100 102  CO2 28 27 29 31 26 24 28   GLUCOSE 174* 183* 192* 186* 141* 260* 178*  BUN 20 22 23 23  30* 32* 21  CREATININE 0.69 0.72 0.77 0.71 0.74 0.88 0.61  CALCIUM 9.3 9.0 -- -- -- -- --  MG -- -- -- -- -- -- --  PHOS -- -- -- -- -- -- --   Cardiac Enzymes: No results found for this basename: CKTOTAL:3,CKMB:3,CKMBINDEX:3,TROPONINI:3 in the last 72 hours CBC:  Lab 09/29/11 0520 09/28/11 1111 09/28/11 0154 09/27/11 1542 09/26/11 0901 09/26/11 0507 09/25/11 0630  WBC 10.4 10.1 11.3* 11.4* 14.9* 16.1* 20.3*  NEUTROABS -- -- -- -- -- -- --  HGB 12.9 13.6 13.7 13.5 13.0 12.7 13.6  HCT 40.4 42.3 41.1 40.9 40.2 39.1 42.5  MCV 83.5 84.6 83.5 83.8 84.1 84.1 84.8  PLT 345 353 324 312 288 299 333       Signed, Sherryl Manges MD  09/29/2011   

## 2011-09-30 ENCOUNTER — Other Ambulatory Visit: Payer: Self-pay

## 2011-09-30 DIAGNOSIS — I5033 Acute on chronic diastolic (congestive) heart failure: Secondary | ICD-10-CM

## 2011-09-30 LAB — BASIC METABOLIC PANEL
CO2: 24 mEq/L (ref 19–32)
Chloride: 106 mEq/L (ref 96–112)
Creatinine, Ser: 0.65 mg/dL (ref 0.50–1.10)
GFR calc Af Amer: >90 mL/min (ref >90)
Potassium: 4.4 mEq/L (ref 3.5–5.1)
Sodium: 140 mEq/L (ref 135–145)

## 2011-09-30 LAB — GLUCOSE, CAPILLARY
Glucose-Capillary: 140 mg/dL — ABNORMAL HIGH (ref 70–99)
Glucose-Capillary: 118 mg/dL — ABNORMAL HIGH (ref 70–99)

## 2011-09-30 LAB — HEPARIN LEVEL (UNFRACTIONATED): Heparin Unfractionated: 0.49 IU/mL (ref 0.30–0.70)

## 2011-09-30 LAB — PROTIME-INR: INR: 2.07 — ABNORMAL HIGH (ref 0.00–1.49)

## 2011-09-30 MED ORDER — WARFARIN SODIUM 5 MG PO TABS
5.0000 mg | ORAL_TABLET | Freq: Once | ORAL | Status: AC
  Administered 2011-09-30: 5 mg via ORAL
  Filled 2011-09-30: qty 1

## 2011-09-30 NOTE — Progress Notes (Signed)
Patient Name: [name]      SUBJECTIVE: much thaks to Dr simonds  feeliong much better with less diarrhea and SOB  Past Medical History  Diagnosis Date  . Atrial fibrillation /flutter   . Hypertension   . Diabetes mellitus   . Hyperlipidemia   . Gout   . Atrial flutter     dccv: 08/2011 - on Flecainide/coumadin  . Diastolic CHF, chronic     echo 2006 - ef 55-65%; mild diast dysfxn  . Arthritis   . Morbid obesity   . OSA (obstructive sleep apnea)     uses cpap  . CHF (congestive heart failure)   . Ejection fraction     EF 60%, echo, September 19, 2011  . Back pain     PHYSICAL EXAM Filed Vitals:   09/30/11 0000 09/30/11 0400 09/30/11 0633 09/30/11 0843  BP:  113/61 132/29 121/64  Pulse: 85 68  99  Temp: 97.8 F (36.6 C) 98.3 F (36.8 C)  98.1 F (36.7 C)  TempSrc: Oral Oral  Oral  Resp: 20 18    Height:      Weight:  255 lb 15.3 oz (116.1 kg)    SpO2: 97% 94%  96%    General appearance: alert, cooperative and no distress Lungs: clear to auscultation bilaterally Heart: irregularly irregular rhythm Abdomen: soft, non-tender; bowel sounds normal; no masses,  no organomegaly Extremities: extremities normal, atraumatic, no cyanosis or edema Skin: Skin color, texture, turgor normal. No rashes or lesions Neurologic: Alert and oriented X 3, normal strength and tone. Normal symmetric reflexes. Normal coordination and gait  TELEMETRY: Reviewed telemetry pt in  afib with controlled VR    Intake/Output Summary (Last 24 hours) at 09/30/11 0850 Last data filed at 09/30/11 0600  Gross per 24 hour  Intake  721.5 ml  Output    205 ml  Net  516.5 ml    LABS: Basic Metabolic Panel:  Lab 09/30/11 1191 09/28/11 1111 09/28/11 0154 09/27/11 0500 09/26/11 0507 09/25/11 1600 09/25/11 0630  NA 140 141 139 139 138 137 135  K 4.4 3.8 3.8 3.4* 2.8* 3.8 5.4*  CL 106 102 102 99 97 101 100  CO2 24 28 27 29 31 26 24   GLUCOSE 122* 174* 183* 192* 186* 141* 260*  BUN 17 20 22  23 23  30* 32*  CREATININE 0.65 0.69 0.72 0.77 0.71 0.74 0.88  CALCIUM 8.8 9.3 -- -- -- -- --  MG -- -- -- -- -- -- --  PHOS -- -- -- -- -- -- --   Cardiac Enzymes: No results found for this basename: CKTOTAL:3,CKMB:3,CKMBINDEX:3,TROPONINI:3 in the last 72 hours CBC:  Lab 09/29/11 0520 09/28/11 1111 09/28/11 0154 09/27/11 1542 09/26/11 0901 09/26/11 0507 09/25/11 0630  WBC 10.4 10.1 11.3* 11.4* 14.9* 16.1* 20.3*  NEUTROABS -- -- -- -- -- -- --  HGB 12.9 13.6 13.7 13.5 13.0 12.7 13.6  HCT 40.4 42.3 41.1 40.9 40.2 39.1 42.5  MCV 83.5 84.6 83.5 83.8 84.1 84.1 84.8  PLT 345 353 324 312 288 299 333   Liver Function Tests: No results found for this basename: AST:2,ALT:2,ALKPHOS:2,BILITOT:2,PROT:2,ALBUMIN:2 in the last 72 hours No results found for this basename: LIPASE:2,AMYLASE:2 in the last 72 hours BNP: No components found with this basename: POCBNP:3 D-Dimer: No results found for this basename: DDIMER:2 in the last 72 hours Hemoglobin A1C: No results found for this basename: HGBA1C in the last 72 hours Fasting Lipid Panel: No results found for this basename: CHOL,HDL,LDLCALC,TRIG,CHOLHDL,LDLDIRECT  in the last 72 hours Thyroid Function Tests: No results found for this basename: TSH,T4TOTAL,FREET3,T3FREE,THYROIDAB in the last 72 hours Anemia Panel: No results found for this basename: VITAMINB12,FOLATE,FERRITIN,TIBC,IRON,RETICCTPCT in the last 72 hours   Device Interrogation:   ASSESSMENT AND PLAN:  Patient Active Hospital Problem List:  DIASTOLIC HEART FAILURE, ACUTE ON CHRONIC (02/20/2010)   Assessment: improved wll continue IV diuretics fopr 24 hrs and then think about stopping dilt as HR is much better controlled      Duiiarrhea   Assessment improved  continur probiotics an: 3 ATRIAL FIBRILLATION, PAROXYSMAL (05/16/2009)   Assessment: will rate controlled will continue wih amiodarone and anticpate DCCV on WED   Plan: as above      Signed, Sherryl Manges  MD  09/30/2011

## 2011-09-30 NOTE — Progress Notes (Signed)
ANTICOAGULATION CONSULT NOTE - Follow Up Consult  Pharmacy Consult for Heparin & Coumadin Indication: atrial fibrillation  Assessment: [name] on IV heparin and coumadin (PTA) for Afib. Heparin level continues to be therapeutic at 0.49 and INR is 2.07.  Plans for DCCV tomorrow. Will try to restart home coumadin dose and watch INR rise due to interaction with amiodarone.  Goal of Therapy:  INR 2-3 Heparin level 0.3-0.7 units/ml   Plan:  1. Continue IV heparin rate at 1450 units/hr. Check HL in AM. 2. Coumadin 5mg  PO x1 today. Check INR in AM.  [name] 09/30/2011,9:34 AM  No Known Allergies  Patient Measurements: Height: 5\' 1"  (154.9 cm) Weight: 255 lb 15.3 oz (116.1 kg) IBW/kg (Calculated) : 47.8  Heparin Dosing Weight: 76.7kg  Vital Signs: Temp: 98.1 F (36.7 C) (12/11 0843) Temp src: Oral (12/11 0843) BP: 121/64 mmHg (12/11 0843) Pulse Rate: 99  (12/11 0843)  Labs:  Basename 09/30/11 0530 09/29/11 1523 09/29/11 0520 09/28/11 1111 09/28/11 0154  HGB -- -- 12.9 13.6 --  HCT -- -- 40.4 42.3 41.1  PLT -- -- 345 353 324  APTT -- -- -- -- --  LABPROT 23.7* -- 21.0* -- 19.4*  INR 2.07* -- 1.78* -- 1.61*  HEPARINUNFRC 0.49 0.49 0.61 -- --  CREATININE 0.65 -- -- 0.69 0.72  CKTOTAL -- -- -- -- --  CKMB -- -- -- -- --  TROPONINI -- -- -- -- --   Estimated Creatinine Clearance: 80.9 ml/min (by C-G formula based on Cr of 0.65).   Medications:  Scheduled:    . amiodarone  400 mg Oral TID  . diltiazem  60 mg Oral Q6H  . famotidine  20 mg Oral BID  . Flora-Q  1 capsule Oral BID  . furosemide  40 mg Intravenous BID  . ibuprofen  600 mg Oral Q6H  . insulin aspart  0-15 Units Subcutaneous TID WC  . insulin aspart  0-5 Units Subcutaneous QHS  . lisinopril  40 mg Oral Daily  . metoprolol tartrate  50 mg Oral QID  . potassium chloride  80 mEq Oral BID  . rosuvastatin  10 mg Oral q1800  . warfarin  7.5 mg Oral ONCE-1800  . DISCONTD: ceFEPime (MAXIPIME) IV  2 g  Intravenous Q12H  . DISCONTD: dextromethorphan-guaiFENesin  1 tablet Oral BID  . DISCONTD: vancomycin  1,000 mg Intravenous Q12H  . DISCONTD: warfarin  7.5 mg Oral ONCE-1800

## 2011-10-01 ENCOUNTER — Other Ambulatory Visit: Payer: Self-pay

## 2011-10-01 ENCOUNTER — Encounter (HOSPITAL_COMMUNITY): Payer: Self-pay | Admitting: Internal Medicine

## 2011-10-01 DIAGNOSIS — R001 Bradycardia, unspecified: Secondary | ICD-10-CM

## 2011-10-01 DIAGNOSIS — I498 Other specified cardiac arrhythmias: Secondary | ICD-10-CM

## 2011-10-01 LAB — GLUCOSE, CAPILLARY
Glucose-Capillary: 118 mg/dL — ABNORMAL HIGH (ref 70–99)
Glucose-Capillary: 179 mg/dL — ABNORMAL HIGH (ref 70–99)

## 2011-10-01 LAB — CULTURE, BLOOD (ROUTINE X 2)
Culture  Setup Time: 201212062217
Culture: NO GROWTH

## 2011-10-01 LAB — BASIC METABOLIC PANEL
Chloride: 102 mEq/L (ref 96–112)
Creatinine, Ser: 0.91 mg/dL (ref 0.50–1.10)
GFR calc Af Amer: 74 mL/min — ABNORMAL LOW (ref >90)
GFR calc non Af Amer: 64 mL/min — ABNORMAL LOW (ref >90)

## 2011-10-01 LAB — HEPARIN LEVEL (UNFRACTIONATED): Heparin Unfractionated: 0.52 IU/mL (ref 0.30–0.70)

## 2011-10-01 MED ORDER — METOPROLOL TARTRATE 50 MG PO TABS
50.0000 mg | ORAL_TABLET | Freq: Two times a day (BID) | ORAL | Status: DC
  Administered 2011-10-01: 50 mg via ORAL
  Filled 2011-10-01 (×4): qty 1

## 2011-10-01 MED ORDER — SODIUM CHLORIDE 0.9 % IJ SOLN
3.0000 mL | Freq: Two times a day (BID) | INTRAMUSCULAR | Status: DC
  Administered 2011-10-01 – 2011-10-05 (×9): 3 mL via INTRAVENOUS

## 2011-10-01 MED ORDER — MAGNESIUM SULFATE BOLUS VIA INFUSION
1.0000 g | Freq: Once | INTRAVENOUS | Status: DC
  Filled 2011-10-01 (×2): qty 500

## 2011-10-01 MED ORDER — DOFETILIDE 500 MCG PO CAPS
500.0000 ug | ORAL_CAPSULE | Freq: Two times a day (BID) | ORAL | Status: DC
  Filled 2011-10-01 (×2): qty 1

## 2011-10-01 MED ORDER — DILTIAZEM HCL ER COATED BEADS 180 MG PO CP24
180.0000 mg | ORAL_CAPSULE | Freq: Every day | ORAL | Status: DC
  Filled 2011-10-01 (×2): qty 1

## 2011-10-01 MED ORDER — SODIUM CHLORIDE 0.9 % IV SOLN: 250.0000 mL | INTRAVENOUS | Status: DC | PRN

## 2011-10-01 MED ORDER — MAGNESIUM SULFATE IN D5W 10-5 MG/ML-% IV SOLN
1.0000 g | Freq: Once | INTRAVENOUS | Status: AC
  Administered 2011-10-01: 1 g via INTRAVENOUS
  Filled 2011-10-01: qty 100

## 2011-10-01 MED ORDER — WARFARIN SODIUM 2 MG PO TABS
2.0000 mg | ORAL_TABLET | Freq: Once | ORAL | Status: AC
  Administered 2011-10-01: 2 mg via ORAL
  Filled 2011-10-01: qty 1

## 2011-10-01 MED ORDER — DOFETILIDE 500 MCG PO CAPS
500.0000 ug | ORAL_CAPSULE | Freq: Two times a day (BID) | ORAL | Status: DC
  Administered 2011-10-01 – 2011-10-03 (×4): 500 ug via ORAL
  Filled 2011-10-01 (×6): qty 1

## 2011-10-01 NOTE — Progress Notes (Signed)
ANTICOAGULATION CONSULT NOTE - Follow Up Consult  Pharmacy Consult for Heparin & Coumadin Indication: atrial fibrillation  Assessment: [name] on IV heparin and coumadin (PTA) for Afib. Heparin level continues to be therapeutic. Protime increased by ~5 sec.  Goal of Therapy:  INR 2-3 Heparin level 0.3-0.7 units/ml   Plan:  1. Continue IV heparin rate at 1450 units/hr. Check HL in AM. 2. Coumadin 2mg  PO x1 today. Check INR in AM. 3.  DC heparin?   [name] D

## 2011-10-01 NOTE — Progress Notes (Signed)
Patient Name: [name]      SUBJECTIVE: feels much better  With less SOB  Past Medical History  Diagnosis Date  . Atrial fibrillation /flutter   . Hypertension   . Diabetes mellitus   . Hyperlipidemia   . Gout   . 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 Diastolic CHF, chronic     echo 2006 - ef 55-65%; mild diast dysfxn  . Arthritis   . Morbid obesity   . OSA (obstructive sleep apnea)     uses cpap  . CHF (congestive heart failure)   . Ejection fraction     EF 60%, echo, September 19, 2011  . Back pain     PHYSICAL EXAM Filed Vitals:   09/30/11 1727 09/30/11 1818 09/30/11 2157 10/01/11 0500  BP: 132/68 144/68 101/51 100/63  Pulse:  86 50 48  Temp:  98.6 F (37 C) 97.5 F (36.4 C) 97.6 F (36.4 C)  TempSrc:  Oral Oral Oral  Resp:  18 20 19   Height:      Weight:    260 lb 12.9 oz (118.3 kg)  SpO2:  96% 99% 96%    General appearance: alert, cooperative, no distress and moderately obese Neck: no JVD Lungs: clear to auscultation bilaterally Heart: slow rate and rhythm, S1, S2 normal, no murmur, click, rub or gallop Abdomen: soft, non-tender; bowel sounds normal; no masses,  no organomegaly Extremities: extremities normal, atraumatic, no cyanosis or edema Neurologic: Alert and oriented X 3, normal strength and tone. Normal symmetric reflexes. Normal coordination and gait  TELEMETRY: Reviewed telemetry pt in sinus brady:    Intake/Output Summary (Last 24 hours) at 10/01/11 0856 Last data filed at 09/30/11 1800  Gross per 24 hour  Intake    595 ml  Output      1 ml  Net    594 ml    LABS: Basic Metabolic Panel:  Lab 09/30/11 4098 09/28/11 1111 09/28/11 0154 09/27/11 0500 09/26/11 0507 09/25/11 1600 09/25/11 0630  NA 140 141 139 139 138 137 135  K 4.4 3.8 3.8 3.4* 2.8* 3.8 5.4*  CL 106 102 102 99 97 101 100  CO2 24 28 27 29 31 26 24   GLUCOSE 122* 174* 183* 192* 186* 141* 260*  BUN 17 20 22 23 23  30* 32*  CREATININE 0.65 0.69  0.72 0.77 0.71 0.74 0.88  CALCIUM 8.8 9.3 -- -- -- -- --  MG -- -- -- -- -- -- --  PHOS -- -- -- -- -- -- --   Cardiac Enzymes: No results found for this basename: CKTOTAL:3,CKMB:3,CKMBINDEX:3,TROPONINI:3 in the last 72 hours CBC:  Lab 09/29/11 0520 09/28/11 1111 09/28/11 0154 09/27/11 1542 09/26/11 0901 09/26/11 0507 09/25/11 0630  WBC 10.4 10.1 11.3* 11.4* 14.9* 16.1* 20.3*  NEUTROABS -- -- -- -- -- -- --  HGB 12.9 13.6 13.7 13.5 13.0 12.7 13.6  HCT 40.4 42.3 41.1 40.9 40.2 39.1 42.5  MCV 83.5 84.6 83.5 83.8 84.1 84.1 84.8  PLT 345 353 324 312 288 299 333   ECG  Sinus brad with QTc 420  ASSESSMENT AND PLAN:  Patient Active Hospital Problem List:  Sinus bradycardia (10/01/2011)   Assessment: this is likely 2/2 multiple meds and additon of amio   Plan: decrease dilt, and lopressor ATRIAL FIBRILLATION, PAROXYSMAL (05/16/2009)   Assessment: will change amiod to tikosyn   Plan: check Mg and K DIASTOLIC HEART FAILURE, ACUTE ON CHRONIC (02/20/2010)   Assessment: continue  iv Lasix   Plan: check bmet in am DM (05/16/2009)   Assessment: stable   Plan:    OBSTRUCTIVE SLEEP APNEA (11/30/2008)   Assessment: needs oputpt eval   Plan:   morbidiity   Pt to ambulate    Signed, Sherryl Manges MD  10/01/2011

## 2011-10-01 NOTE — Plan of Care (Signed)
Problem: Phase III Progression Outcomes Goal: Other Phase III Outcomes/Goals Outcome: Progressing Patient has remained SB high 40-low 50 on tele.  No signs of respiratory or cardiac distress during the course of the shift.    Problem: Discharge Progression Outcomes Goal: Barriers To Progression Addressed/Resolved Outcome: Progressing Patient has tolerated diet during the course of the shift no distress Goal: Sinus rate/atrial ECG rhythm with HR < 100/min Outcome: Progressing Patient has remained hight 40- low 50 during the course of the shift Goal: Pain controlled with appropriate interventions Outcome: Progressing Patient denies any pain during the course of the shift

## 2011-10-01 NOTE — Progress Notes (Signed)
   CARE MANAGEMENT NOTE 10/01/2011  Patient:  [name],[name]   Account Number:  [id number]  Date Initiated:  09/19/2011  Documentation initiated by:  SUITS,TERI  Subjective/Objective Assessment:   Pt is 70 yr old seen in the ED per request to arrange home health CHF program.     Action/Plan:   Facilitate referral to home health CHF program   Anticipated DC Date:  09/24/2011   Anticipated DC Plan:  HOME W HOME HEALTH SERVICES      DC Planning Services  CM consult      [facility] Choice  HOME HEALTH   Choice offered to / List presented to:  C-1 Patient        HH arranged  HH-10 DISEASE MANAGEMENT  HH-1 RN      [facility] agency  [facility]   Status of service:  In process, will continue to follow Medicare Important Message given?  NA - LOS <3 / Initial given by admissions (If response is "NO", the following Medicare IM given date fields will be blank) Date Medicare IM given:   Date Additional Medicare IM given:    Discharge Disposition:    Per UR Regulation:  Reviewed for med. necessity/level of care/duration of stay  Comments:  Met with patient to discuss how things were progressing as she has been in hospital for 12 days.  She states she continues to improve.  She will discharge with HH and [facility] as noted below.  Will continue to assist. 10/01/11 1537 Shannan Harper, RN, BSN  UR Concurrent Review Completed. 09/29/11 1610 Shannan Harper, RN, BSN  UR Concurrent Review Completed. 09/25/11 1205 Shannan Harper, RN, BSN Patient was a rapid response later following s/p ablation procedure.  Was transferred to Step Down for further treatment as she is now experiencing an acute CHF exacerbation.  Will continue to monitor.  09/23/11 0900 Shannan Harper, RN, BSN Able to refer to [facility], waiting to hear from [employer], RN Case Production designer, theatre/television/film.  Also able to provide patient with a scale.  Will continue to assist.  UR Completed. 09/22/11 1130 Shannan Harper, RN, BSN  Met with patient.  Patient  agreed to [facility] referral. Also will determine if she is appropriate for scales program. Will continue to assist.  Discussed education measures, diet, salt, daily weights, and zone goals.  09/21/11 1200 Physician, please order HH RN for Heart Failure Home Health Screen and complete Face to Face documentation.  Pt. has chosen [facility] for services.  NCM to follow.  Tera Mater, RN, BSN Case Manager # [phone].    09/19/11 12:05 In to see patient and visitor to discuss home health DM options, pt selected [facility] because she has DME from them.  Per patient and nursing pt. is being admitted to the hospital from the ED. Jim Like RN BSN CCM

## 2011-10-01 NOTE — Progress Notes (Signed)
Patient has remained free from injury during the course of the shift, no signs of respiratory or cardiac distress.  Patient has been high 40s-low 50s on tele.  Skin has remained intact and dry, diet and medications tolerated with no distress, patient is currently resting in bed, call bell in reach and side rails in an upright position.  Heparin is currently infusing at 14.5. No other pertinent findings at this time. Will continue to monitor patient during the course of the shift. Marylene Land RN

## 2011-10-01 NOTE — Progress Notes (Signed)
Discussed in the long length of stay meeting [name] Weeks 10/01/2011  

## 2011-10-01 NOTE — Progress Notes (Signed)
Notified Ward Givens that BMET and Mag results back. Pharmacy informed me that mag had to be over 2.0. Orders received for 1gm IV mag. I also notified Thayer Ohm that QTC per EKG is 437. He stated after giving the magnesium to give the Tikosyn dose. Will recheck QTC 2 hours after dose given.

## 2011-10-01 NOTE — Progress Notes (Signed)
Notified Nicki, no new orders received. Will cont to monitor

## 2011-10-01 NOTE — Progress Notes (Signed)
Ekg done 2 hours after initial tikosyn dose given. QTC was 437, it increased to 479. Ulyess Blossom on call and paged to be made aware. Awaiting return of call

## 2011-10-02 ENCOUNTER — Other Ambulatory Visit: Payer: Self-pay

## 2011-10-02 DIAGNOSIS — I495 Sick sinus syndrome: Secondary | ICD-10-CM

## 2011-10-02 DIAGNOSIS — I4891 Unspecified atrial fibrillation: Secondary | ICD-10-CM

## 2011-10-02 LAB — GLUCOSE, CAPILLARY
Glucose-Capillary: 116 mg/dL — ABNORMAL HIGH (ref 70–99)
Glucose-Capillary: 157 mg/dL — ABNORMAL HIGH (ref 70–99)
Glucose-Capillary: 124 mg/dL — ABNORMAL HIGH (ref 70–99)

## 2011-10-02 LAB — BASIC METABOLIC PANEL
CO2: 25 mEq/L (ref 19–32)
Chloride: 101 mEq/L (ref 96–112)
GFR calc Af Amer: >90 mL/min (ref >90)
Sodium: 138 mEq/L (ref 135–145)

## 2011-10-02 LAB — HEPARIN LEVEL (UNFRACTIONATED): Heparin Unfractionated: 0.63 IU/mL (ref 0.30–0.70)

## 2011-10-02 LAB — MAGNESIUM: Magnesium: 2.2 mg/dL (ref 1.5–2.5)

## 2011-10-02 MED ORDER — METOPROLOL TARTRATE 25 MG PO TABS
25.0000 mg | ORAL_TABLET | Freq: Two times a day (BID) | ORAL | Status: DC
  Administered 2011-10-02 – 2011-10-05 (×6): 25 mg via ORAL
  Filled 2011-10-02 (×10): qty 1

## 2011-10-02 MED ORDER — WARFARIN SODIUM 5 MG PO TABS
5.0000 mg | ORAL_TABLET | ORAL | Status: DC
  Administered 2011-10-02 – 2011-10-04 (×3): 5 mg via ORAL
  Filled 2011-10-02 (×4): qty 1

## 2011-10-02 MED ORDER — WARFARIN SODIUM 2 MG PO TABS: 2.0000 mg | ORAL_TABLET | ORAL | Status: DC

## 2011-10-02 MED ORDER — DILTIAZEM HCL ER COATED BEADS 120 MG PO CP24
120.0000 mg | ORAL_CAPSULE | Freq: Every day | ORAL | Status: DC
  Administered 2011-10-02 – 2011-10-05 (×4): 120 mg via ORAL
  Filled 2011-10-02 (×4): qty 1

## 2011-10-02 NOTE — Progress Notes (Signed)
Called [employer] for Tikosyn at discharge.  Two original pharmacies requested by patient did not carry medication.  Spoke with Jeanice Lim at [facility] and they were able to fill the script for drop ship after supplying the DEA number and MD.  Will leave note for discharge as patient will require a week's supply from pharmacy.  Patient is aware and has number and directions to pharmacy.  Will continue to assist. Zella Ball 10/02/2011 [phone]

## 2011-10-02 NOTE — Progress Notes (Signed)
ANTICOAGULATION CONSULT NOTE - Follow Up Consult  Pharmacy Consult for Heparin & Coumadin Indication: atrial fibrillation  Assessment: [name] on IV heparin and coumadin (PTA) for Afib. Heparin level continues to be therapeutic. Protime decreased after smaller dose yesterday  Goal of Therapy:  INR 2-3 Heparin level 0.3-0.7 units/ml   Plan:  1. D/C heparin 2. Resume home coumadin dose 5 mg daily except 2.5 on Wed.  Check INR in AM.    Blanchie Dessert D

## 2011-10-02 NOTE — Progress Notes (Signed)
Patient Name: [name]      SUBJECTIVE:feels well much less sob   Past Medical History  Diagnosis Date  . Atrial fibrillation /flutter   . Hypertension   . Diabetes mellitus   . Hyperlipidemia   . Gout   . 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 Diastolic CHF, chronic     echo 2006 - ef 55-65%; mild diast dysfxn  . Arthritis   . Morbid obesity   . OSA (obstructive sleep apnea)     uses cpap  . CHF (congestive heart failure)   . Ejection fraction     EF 60%, echo, September 19, 2011  . Back pain     PHYSICAL EXAM Filed Vitals:   10/01/11 0500 10/01/11 1042 10/01/11 1400 10/01/11 2300  BP: 100/63 118/75 121/70 146/73  Pulse: 48  53 58  Temp: 97.6 F (36.4 C)  97.8 F (36.6 C) 97.9 F (36.6 C)  TempSrc: Oral   Oral  Resp: 19  18 19   Height:      Weight: 260 lb 12.9 oz (118.3 kg)     SpO2: 96%  97% 96%    General appearance: alert, cooperative and moderately obese Lungs: clear to auscultation bilaterally Heart: slow but regular rate and rhythm, S1, S2 normal, no murmur, click, rub or gallop Abdomen: soft, non-tender; bowel sounds normal; no masses,  no organomegaly Extremities: extremities normal, atraumatic, no cyanosis or edema Skin: Skin color, texture, turgor normal. No rashes or lesions Neurologic: Alert and oriented X 3, normal strength and tone. Normal symmetric reflexes. Normal coordination and gait  TELEMETRY: Reviewed telemetry pt in sinus brady:    Intake/Output Summary (Last 24 hours) at 10/02/11 0752 Last data filed at 10/01/11 1500  Gross per 24 hour  Intake    480 ml  Output      0 ml  Net    480 ml    LABS: Basic Metabolic Panel:  Lab 10/02/11 1610 10/01/11 1111 09/30/11 0530 09/28/11 1111 09/28/11 0154 09/27/11 0500 09/26/11 0507  NA 138 140 140 141 139 139 138  K 4.6 4.3 4.4 3.8 3.8 3.4* 2.8*  CL 101 102 106 102 102 99 97  CO2 25 26 24 28 27 29 31   GLUCOSE 119* 105* 122* 174* 183* 192* 186*  BUN 22 23 17  20 22 23 23   CREATININE 0.65 0.91 0.65 0.69 0.72 0.77 0.71  CALCIUM 9.6 9.6 -- -- -- -- --  MG 2.2 1.9 -- -- -- -- --  PHOS -- -- -- -- -- -- --   Cardiac Enzymes: No results found for this basename: CKTOTAL:3,CKMB:3,CKMBINDEX:3,TROPONINI:3 in the last 72 hours CBC:  Lab 09/29/11 0520 09/28/11 1111 09/28/11 0154 09/27/11 1542 09/26/11 0901 09/26/11 0507  WBC 10.4 10.1 11.3* 11.4* 14.9* 16.1*  NEUTROABS -- -- -- -- -- --  HGB 12.9 13.6 13.7 13.5 13.0 12.7  HCT 40.4 42.3 41.1 40.9 40.2 39.1  MCV 83.5 84.6 83.5 83.8 84.1 84.1  PLT 345 353 324 312 288 299   ECG: QTc 471 Device Interrogation   ASSESSMENT AND PLAN   Patient Active Hospital Problem List:  Sinus bradycardia (10/01/2011)   Assessment: persisting and will likely be chrontoropically incompetent   Plan: will decrease dilt and lopressor ATRIAL FIBRILLATION, PAROXYSMAL (05/16/2009)   Assessment: continue tikosyn   Plan: anticipate discharge tomorrow pm DIASTOLIC HEART FAILURE, ACUTE ON CHRONIC (02/20/2010)   Assessment: euvolemic, but will continue IV lasix x 1  More day as Bun/Cr not started to Bump    Plan:      Signed, Sherryl Manges MD  10/02/2011

## 2011-10-03 ENCOUNTER — Other Ambulatory Visit: Payer: Self-pay

## 2011-10-03 DIAGNOSIS — E875 Hyperkalemia: Secondary | ICD-10-CM

## 2011-10-03 LAB — BASIC METABOLIC PANEL
CO2: 26 mEq/L (ref 19–32)
Calcium: 9.5 mg/dL (ref 8.4–10.5)
Chloride: 100 mEq/L (ref 96–112)
Creatinine, Ser: 0.86 mg/dL (ref 0.50–1.10)
GFR calc Af Amer: 79 mL/min — ABNORMAL LOW (ref >90)
Sodium: 137 mEq/L (ref 135–145)

## 2011-10-03 LAB — PROTIME-INR
INR: 2.12 — ABNORMAL HIGH (ref 0.00–1.49)
Prothrombin Time: 24.1 s — ABNORMAL HIGH (ref 11.6–15.2)

## 2011-10-03 LAB — GLUCOSE, CAPILLARY: Glucose-Capillary: 116 mg/dL — ABNORMAL HIGH (ref 70–99)

## 2011-10-03 MED ORDER — WARFARIN SODIUM 2.5 MG PO TABS: 2.5000 mg | ORAL_TABLET | ORAL | Status: DC

## 2011-10-03 MED ORDER — FUROSEMIDE 40 MG PO TABS
40.0000 mg | ORAL_TABLET | Freq: Two times a day (BID) | ORAL | Status: DC
  Administered 2011-10-03 – 2011-10-05 (×5): 40 mg via ORAL
  Filled 2011-10-03 (×7): qty 1

## 2011-10-03 MED ORDER — POTASSIUM CHLORIDE CRYS ER 20 MEQ PO TBCR
40.0000 meq | EXTENDED_RELEASE_TABLET | Freq: Two times a day (BID) | ORAL | Status: DC
  Filled 2011-10-03 (×2): qty 2

## 2011-10-03 MED ORDER — AMIODARONE HCL 200 MG PO TABS
400.0000 mg | ORAL_TABLET | Freq: Two times a day (BID) | ORAL | Status: DC
  Filled 2011-10-03 (×2): qty 2

## 2011-10-03 MED ORDER — DOFETILIDE 500 MCG PO CAPS
500.0000 ug | ORAL_CAPSULE | Freq: Two times a day (BID) | ORAL | Status: DC
  Administered 2011-10-03 – 2011-10-05 (×4): 500 ug via ORAL
  Filled 2011-10-03 (×6): qty 1

## 2011-10-03 NOTE — Progress Notes (Signed)
Pt has informed RN that she cannot afford Erskine Squibb  We will switch her to amiodarone

## 2011-10-03 NOTE — Progress Notes (Signed)
Patient Name: [name]      SUBJECTIVE:feels well  Walking around`    Past Medical History  Diagnosis Date  . Atrial fibrillation /flutter   . Hypertension   . Diabetes mellitus   . Hyperlipidemia   . Gout   . 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 Diastolic CHF, chronic     echo 2006 - ef 55-65%; mild diast dysfxn  . Arthritis   . Morbid obesity   . OSA (obstructive sleep apnea)     uses cpap  . CHF (congestive heart failure)   . Ejection fraction     EF 60%, echo, September 19, 2011  . Back pain     PHYSICAL EXAM Filed Vitals:   10/02/11 1000 10/02/11 1400 10/02/11 2300 10/03/11 0636  BP: 116/63 105/58 112/71 127/64  Pulse:  51 56 53  Temp:  98 F (36.7 C) 98 F (36.7 C) 97.9 F (36.6 C)  TempSrc:  Oral Oral Oral  Resp:  19 19 19   Height:      Weight:      SpO2:  96% 96%     General appearance: alert, cooperative, no distress and morbidly obese Lungs: clear to auscultation bilaterally Heart: regular rate and rhythm, S1, S2 normal, no murmur, click, rub or gallop Abdomen: soft, non-tender; bowel sounds normal; no masses,  no organomegaly Extremities: extremities normal, atraumatic, no cyanosis or edema Skin: Skin color, texture, turgor normal. No rashes or lesions Neurologic: Alert and oriented X 3, normal strength and tone. Normal symmetric reflexes. Normal coordination and gait  TELEMETRY: Reviewed telemetry pt in NSR except two paroxyzms of AF duration about 1 hour   Intake/Output Summary (Last 24 hours) at 10/03/11 0852 Last data filed at 10/02/11 1552  Gross per 24 hour  Intake      0 ml  Output    250 ml  Net   -250 ml    LABS: Basic Metabolic Panel:  Lab 10/03/11 4098 10/02/11 0537 10/01/11 1111 09/30/11 0530 09/28/11 1111 09/28/11 0154 09/27/11 0500  NA 137 138 140 140 141 139 139  K 5.2* 4.6 4.3 4.4 3.8 3.8 3.4*  CL 100 101 102 106 102 102 99  CO2 26 25 26 24 28 27 29   GLUCOSE 128* 119* 105* 122* 174* 183*  192*  BUN 27* 22 23 17 20 22 23   CREATININE 0.86 0.65 0.91 0.65 0.69 0.72 0.77  CALCIUM 9.5 9.6 -- -- -- -- --  MG -- 2.2 1.9 -- -- -- --  PHOS -- -- -- -- -- -- --   Cardiac Enzymes: No results found for this basename: CKTOTAL:3,CKMB:3,CKMBINDEX:3,TROPONINI:3 in the last 72 hours CBC:  Lab 09/29/11 0520 09/28/11 1111 09/28/11 0154 09/27/11 1542 09/26/11 0901  WBC 10.4 10.1 11.3* 11.4* 14.9*  NEUTROABS -- -- -- -- --  HGB 12.9 13.6 13.7 13.5 13.0  HCT 40.4 42.3 41.1 40.9 40.2  MCV 83.5 84.6 83.5 83.8 84.1  PLT 345 353 324 312 288    PT>2    ASSESSMENT AND PLAN: Patient Active Hospital Problem List:  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

## 2011-10-03 NOTE — Progress Notes (Signed)
ANTICOAGULATION CONSULT NOTE - Follow Up Consult  Pharmacy Consult for Coumadin Indication: atrial fibrillation  No Known Allergies  Patient Measurements: Height:  (5 feet 1 inch) Weight: 260 lb 12.9 oz (118.3 kg) IBW/kg (Calculated) : 47.8  Adjusted Body Weight:   Vital Signs: Temp: 97.9 F (36.6 C) (12/14 0636) Temp src: Oral (12/14 0636) BP: 127/64 mmHg (12/14 0636) Pulse Rate: 53  (12/14 0636)  Labs:  Basename 10/03/11 0535 10/02/11 0537 10/01/11 1111 10/01/11 0645  HGB -- -- -- --  HCT -- -- -- --  PLT -- -- -- --  APTT -- -- -- --  LABPROT 24.1* 25.5* -- 29.0*  INR 2.12* 2.28* -- 2.69*  HEPARINUNFRC -- 0.63 -- 0.52  CREATININE 0.86 0.65 0.91 --  CKTOTAL -- -- -- --  CKMB -- -- -- --  TROPONINI -- -- -- --   Estimated Creatinine Clearance: 76.2 ml/min (by C-G formula based on Cr of 0.86).  Assessment: 69yof on Coumadin for Afib. INR (2.12) is therapeutic on home regimen - will continue.  Goal of Therapy:  INR 2-3   Plan:  1. Continue Coumadin home regimen (5mg  daily except 2.5mg  on Wed) - 5mg  due today 2. Follow-up AM INR and Cardiology recommendations  [name] [phone] 10/03/2011,9:58 AM

## 2011-10-03 NOTE — Progress Notes (Signed)
Pt went back into afib. She relates her coughing spells to her intermittent afib. Pt has had spontaneous periods of afib but has converted back to sinus brady. Ward Givens notified, no new orders received. Will cont to monitor

## 2011-10-03 NOTE — Progress Notes (Signed)
Pts bp 147/84, hr sustaining in the 110s-130s. Pt stated that she doesn't feel well and she can not tolerate going to the bathroom now without getting short of breath which is new for her. She is not in any distress but notices a difference in how she feels. Rhonda notified and measures taken such as BSC by the patient so she doesn't need to ambulate further and o2 Zeb prn. Will cont to monitor and notify PA if situation worsens

## 2011-10-03 NOTE — Progress Notes (Signed)
Pt informed me that she was unable to pay for her tikosyn and that she had no other resources to allow her to pay the 43 dollars/month for the prescription. I notified CM Annice Pih who has been working with patient and because pt had medicare, she didn't qualify for [employer]. Dr. Graciela Husbands notified and orders received to change medication

## 2011-10-03 NOTE — Progress Notes (Signed)
Met with patient this morning to give her the copay amount for the tikosyn as well as the number to the pharmacy and the directions.  She stated that she would have problems paying for the drug.  I explained to her because she has insurance she is not eligible for indigent funds.  She stated she would call her sons to see if they could assist her.  Later prior to the note being entered the RN, Chrissy called to notify the NCM she would not be able to afford the medication.  Dr. Graciela Husbands wanted to switch her to amiodarone and/or do something through a fund in his office.  The patient could apply for assistance through the Drug Company who supplies the drug, however the application process with notification takes 3-4 weeks and is not guaranteed.  The RN at the bedside will call back to Dr. Graciela Husbands to let him know the patient states she will find a way to afford this medication.  Presently the patient does not want to go back on amiodarone as Dr. Graciela Husbands was planning to do based on the fact she cannot afford the medication.  Will continue to assist.  Application will be provided to patient if Dr. Graciela Husbands wants to continue with discharging on Tikosyn.  Zella Ball 10/03/2011 [phone]

## 2011-10-03 NOTE — Progress Notes (Signed)
Pt has remained in afib this time since 1315. Pt asymptomatic, but heart rate in the 110s. Rhonda notified who spoke with Dr. Graciela Husbands and plan is to continue treatment with Tikosyn at this time. EKG done. Will follow through and cont to monitor

## 2011-10-03 NOTE — Progress Notes (Signed)
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 She feels it will be more successful and was adamant that Tikosyn be restarted. Pt also had a small run of afib in the 120s but converted herself back to SB. Dr. Graciela Husbands notified of both situations and orders received. Will cont to monitor and notified Case Manager of situation as well.

## 2011-10-04 ENCOUNTER — Other Ambulatory Visit: Payer: Self-pay

## 2011-10-04 DIAGNOSIS — I4891 Unspecified atrial fibrillation: Secondary | ICD-10-CM

## 2011-10-04 LAB — BASIC METABOLIC PANEL
CO2: 22 mEq/L (ref 19–32)
CO2: 27 mEq/L (ref 19–32)
Calcium: 9.7 mg/dL (ref 8.4–10.5)
Chloride: 97 mEq/L (ref 96–112)
Chloride: 98 mEq/L (ref 96–112)
Glucose, Bld: 126 mg/dL — ABNORMAL HIGH (ref 70–99)
Potassium: 5.6 mEq/L — ABNORMAL HIGH (ref 3.5–5.1)
Potassium: 4.5 mEq/L (ref 3.5–5.1)
Sodium: 134 mEq/L — ABNORMAL LOW (ref 135–145)
Sodium: 136 mEq/L (ref 135–145)

## 2011-10-04 LAB — GLUCOSE, CAPILLARY
Glucose-Capillary: 141 mg/dL — ABNORMAL HIGH (ref 70–99)
Glucose-Capillary: 147 mg/dL — ABNORMAL HIGH (ref 70–99)
Glucose-Capillary: 151 mg/dL — ABNORMAL HIGH (ref 70–99)
Glucose-Capillary: 168 mg/dL — ABNORMAL HIGH (ref 70–99)

## 2011-10-04 NOTE — Progress Notes (Signed)
Pt converted from afib to sinus brady at 2250. Will continue to monitor.

## 2011-10-04 NOTE — Progress Notes (Signed)
ANTICOAGULATION CONSULT NOTE - Follow Up Consult  Pharmacy Consult for Coumadin Indication: atrial fibrillation  No Known Allergies  Patient Measurements: Height:  (5 feet 1 inch) Weight: 260 lb 12.9 oz (118.3 kg) IBW/kg (Calculated) : 47.8  Adjusted Body Weight:   Vital Signs: Temp: 97.5 F (36.4 C) (12/15 0500) BP: 106/65 mmHg (12/15 0500) Pulse Rate: 58  (12/15 0500)  Labs:  Basename 10/04/11 0600 10/03/11 0535 10/02/11 0537  HGB -- -- --  HCT -- -- --  PLT -- -- --  APTT -- -- --  LABPROT 26.5* 24.1* 25.5*  INR 2.39* 2.12* 2.28*  HEPARINUNFRC -- -- 0.63  CREATININE 0.85 0.86 0.65  CKTOTAL -- -- --  CKMB -- -- --  TROPONINI -- -- --   Estimated Creatinine Clearance: 77.1 ml/min (by C-G formula based on Cr of 0.85).  Assessment: 69yof on Coumadin for Afib. INR (2.39) is therapeutic on home regimen - will continue. Pt in afib overnight, per RN has converted back to sinus brady at 0550.   Goal of Therapy:  INR 2-3   Plan:  1. Continue Coumadin home regimen (5mg  daily except 2.5mg  on Wed) - 5mg  due today 2. Follow-up AM INR and Cardiology recommendations  Jadakiss Barish,PharmD [phone] 10/04/2011,7:40 AM

## 2011-10-04 NOTE — Progress Notes (Signed)
Pt converted from sinus brady to afib at 0322. Heart rate ranging from 95-115. Will continue to monitor.

## 2011-10-04 NOTE — Progress Notes (Signed)
Pt converted from afib to sinus brady at 0550. Will continue to monitor.

## 2011-10-04 NOTE — Progress Notes (Signed)
Patient ID: Gearldine Looney, female   DOB: [birth year], 70 y.o.   MRN: [id number] [facility]  SUBJECTIVE: In sinus rhythm this morning.  No complaints.   Telemetry: Several runs of atrial fibrillation overnight but predominant sinus.    Filed Vitals:   10/03/11 1020 10/03/11 1400 10/03/11 2200 10/04/11 0500  BP:  111/65 104/71 106/65  Pulse: 63 54 87 58  Temp:  98.2 F (36.8 C) 98.2 F (36.8 C) 97.5 F (36.4 C)  TempSrc:  Oral    Resp:  18 20 20   Height:      Weight:      SpO2:  97% 98% 97%    Intake/Output Summary (Last 24 hours) at 10/04/11 1039 Last data filed at 10/04/11 0857  Gross per 24 hour  Intake    480 ml  Output    200 ml  Net    280 ml    LABS: Basic Metabolic Panel:  Basename 10/04/11 0600 10/03/11 0535 10/02/11 0537 10/01/11 1111  NA 134* 137 -- --  K 5.6* 5.2* -- --  CL 97 100 -- --  CO2 22 26 -- --  GLUCOSE 126* 128* -- --  BUN 29* 27* -- --  CREATININE 0.85 0.86 -- --  CALCIUM 9.2 9.5 -- --  MG -- -- 2.2 1.9  PHOS -- -- -- --      . diltiazem  120 mg Oral Daily  . dofetilide  500 mcg Oral Q12H  . famotidine  20 mg Oral BID  . Flora-Q  1 capsule Oral BID  . furosemide  40 mg Oral BID  . ibuprofen  600 mg Oral Q6H  . insulin aspart  0-15 Units Subcutaneous TID WC  . insulin aspart  0-5 Units Subcutaneous QHS  . lisinopril  40 mg Oral Daily  . metoprolol tartrate  25 mg Oral BID  . rosuvastatin  10 mg Oral q1800  . sodium chloride  3 mL Intravenous Q12H  . warfarin  2.5 mg Oral Custom  . warfarin  5 mg Oral Custom  . DISCONTD: potassium chloride  40 mEq Oral BID    PHYSICAL EXAM General: NAD, obese. Neck: Thick, no JVD, no thyromegaly or thyroid nodule.  Lungs: Slight crackles at bases. CV: Nondisplaced PMI.  Heart regular S1/S2, no S3/S4, no murmur.  No peripheral edema.   Abdomen: Soft, nontender, no hepatosplenomegaly, no distention.  Neurologic: Alert and oriented x 3.  Psych: Normal affect. Extremities: No clubbing or  cyanosis.   ECG: NSR, QTc 476 msec  ASSESSMENT AND PLAN: 70 yo with diastolic CHF, OSA, paroxysmal atrial fibrillation has been in hospital with atrial fibrillation/RVR and acute on chronic diastolic CHF.  1. Acute on chronic diastolic CHF: Now on po Lasix.  Still some dyspnea with walking but better.  Discontinue oxygen and check sats.  2. Paroxysmal atrial fibrillation: Now thinks she will be able to get dofetilide, so continuing this medication. She still has had occasional runs of atrial fibrillation but is now predominantly in NSR.  QTc ok.  Continue warfarin.  3. Hyperkalemia: K running higher off IV lasix, it is 5.6 today.  Hold supplemental KCl today, likely decrease to 20 mEq daily.  4. Disposition:  Discussed with patient, likely home tomorrow.  Family moving from 2nd floor apt to ground floor today, will be hard for her to walk up the steps today.  Also hyperkalemic today.  Will plan repeat BMET in am, discharge to new ground floor apartment tomorrow with home health.  Marca Ancona 10/04/2011 10:44 AM

## 2011-10-05 LAB — CBC
MCH: 27.1 pg (ref 26.0–34.0)
Platelets: 328 10*3/uL (ref 150–400)
RBC: 5.28 MIL/uL — ABNORMAL HIGH (ref 3.87–5.11)

## 2011-10-05 LAB — BASIC METABOLIC PANEL
Calcium: 9.6 mg/dL (ref 8.4–10.5)
GFR calc non Af Amer: 51 mL/min — ABNORMAL LOW (ref >90)
Glucose, Bld: 121 mg/dL — ABNORMAL HIGH (ref 70–99)
Sodium: 136 mEq/L (ref 135–145)

## 2011-10-05 LAB — GLUCOSE, CAPILLARY

## 2011-10-05 LAB — PROTIME-INR: INR: 2.49 — ABNORMAL HIGH (ref 0.00–1.49)

## 2011-10-05 MED ORDER — GUAIFENESIN-DM 100-10 MG/5ML PO SYRP: 5.0000 mL | ORAL_SOLUTION | ORAL | Status: DC | PRN

## 2011-10-05 MED ORDER — FUROSEMIDE 40 MG PO TABS: 40.0000 mg | ORAL_TABLET | Freq: Two times a day (BID) | ORAL | Status: DC

## 2011-10-05 MED ORDER — POTASSIUM CHLORIDE CRYS ER 20 MEQ PO TBCR: 40.0000 meq | EXTENDED_RELEASE_TABLET | Freq: Two times a day (BID) | ORAL | Status: DC

## 2011-10-05 MED ORDER — POTASSIUM CHLORIDE CRYS ER 20 MEQ PO TBCR
40.0000 meq | EXTENDED_RELEASE_TABLET | Freq: Once | ORAL | Status: AC
  Administered 2011-10-05: 40 meq via ORAL

## 2011-10-05 MED ORDER — ALPRAZOLAM 0.25 MG PO TABS: 0.2500 mg | ORAL_TABLET | Freq: Two times a day (BID) | ORAL | Status: AC | PRN

## 2011-10-05 MED ORDER — METOPROLOL TARTRATE 25 MG PO TABS: 25.0000 mg | ORAL_TABLET | Freq: Two times a day (BID) | ORAL | Status: DC

## 2011-10-05 MED ORDER — DOFETILIDE 500 MCG PO CAPS: 500.0000 ug | ORAL_CAPSULE | Freq: Two times a day (BID) | ORAL | Status: DC

## 2011-10-05 MED ORDER — IBUPROFEN 100 MG/5ML PO SUSP
400.0000 mg | Freq: Three times a day (TID) | ORAL | Status: DC | PRN
  Filled 2011-10-05: qty 20

## 2011-10-05 MED ORDER — POTASSIUM CHLORIDE 20 MEQ/15ML (10%) PO LIQD
40.0000 meq | Freq: Every day | ORAL | Status: DC
  Filled 2011-10-05: qty 30

## 2011-10-05 MED ORDER — DILTIAZEM HCL ER COATED BEADS 120 MG PO CP24: 120.0000 mg | ORAL_CAPSULE | Freq: Every day | ORAL | Status: DC

## 2011-10-05 MED ORDER — POTASSIUM CHLORIDE CRYS ER 20 MEQ PO TBCR: 40.0000 meq | EXTENDED_RELEASE_TABLET | Freq: Every day | ORAL | Status: DC

## 2011-10-05 MED ORDER — POTASSIUM CHLORIDE CRYS ER 20 MEQ PO TBCR
40.0000 meq | EXTENDED_RELEASE_TABLET | Freq: Two times a day (BID) | ORAL | Status: DC
  Administered 2011-10-05: 40 meq via ORAL

## 2011-10-05 NOTE — Progress Notes (Addendum)
Patient ID: [name], female   DOB: 31-Mar-1941, 70 y.o.   MRN: [id number] [facility]  SUBJECTIVE: In sinus rhythm this morning.  No complaints.   Telemetry: Several short runs of atrial fibrillation overnight but predominant sinus.    Filed Vitals:   10/04/11 1449 10/04/11 2032 10/04/11 2200 10/05/11 0500  BP: 114/63  113/72 111/60  Pulse: 77 123 102 94  Temp: 97 F (36.1 C)  97.9 F (36.6 C) 97.5 F (36.4 C)  TempSrc: Oral     Resp: 18  18 18   Height:      Weight:      SpO2: 95%  96% 96%    Intake/Output Summary (Last 24 hours) at 10/05/11 1007 Last data filed at 10/04/11 1738  Gross per 24 hour  Intake    240 ml  Output      0 ml  Net    240 ml    LABS: Basic Metabolic Panel:  Basename 10/05/11 0540 10/04/11 1036  NA 136 136  K 4.0 4.5  CL 97 98  CO2 25 27  GLUCOSE 121* 177*  BUN 37* 31*  CREATININE 1.10 1.19*  CALCIUM 9.6 9.7  MG -- --  PHOS -- --      . diltiazem  120 mg Oral Daily  . dofetilide  500 mcg Oral Q12H  . famotidine  20 mg Oral BID  . Flora-Q  1 capsule Oral BID  . furosemide  40 mg Oral BID  . insulin aspart  0-15 Units Subcutaneous TID WC  . insulin aspart  0-5 Units Subcutaneous QHS  . lisinopril  40 mg Oral Daily  . metoprolol tartrate  25 mg Oral BID  . potassium chloride  40 mEq Oral Daily  . rosuvastatin  10 mg Oral q1800  . sodium chloride  3 mL Intravenous Q12H  . warfarin  2.5 mg Oral Custom  . warfarin  5 mg Oral Custom  . DISCONTD: ibuprofen  600 mg Oral Q6H  . DISCONTD: potassium chloride  40 mEq Oral BID    PHYSICAL EXAM General: NAD, obese. Neck: Thick, no JVD, no thyromegaly or thyroid nodule.  Lungs: Slight crackles at bases. CV: Nondisplaced PMI.  Heart regular S1/S2, no S3/S4, no murmur.  No peripheral edema.   Abdomen: Soft, nontender, no hepatosplenomegaly, no distention.  Neurologic: Alert and oriented x 3.  Psych: Normal affect. Extremities: No clubbing or cyanosis.   ECG: NSR, QTc 476  msec  ASSESSMENT AND PLAN: 70 yo with diastolic CHF, OSA, paroxysmal atrial fibrillation has been in hospital with atrial fibrillation/RVR and acute on chronic diastolic CHF.  1. Acute on chronic diastolic CHF: Now on po Lasix.  Still some dyspnea with walking but better.  She is on oxygen at night.  Oxygen sats were ok off oxygen during the day yesterday.  2. Paroxysmal atrial fibrillation: Now thinks she will be able to get dofetilide, so continuing this medication. She still has had occasional short runs of atrial fibrillation but is now predominantly in NSR.  QTc ok yesterday.  Continue warfarin.  3. Hyperkalemia: K normal today.  Restart KCl 40 mEq daily.  4. Disposition:  Plan discharge today.  Close followup with Dr. Graciela Husbands, should be seen within a week.  Coumadin clinic followup.  Would like home health RN to check in.  Meds:  KCl 40, Lasix 40 po bid, dofetilide 500 bid (see sticky notes regarding how scripts should be written for dofetilide), warfarin, diltiazem Cd 120 daily, metoprolol 25  bid, lisinopril 40.   Marca Ancona 10/05/2011 10:07 AM

## 2011-10-05 NOTE — Discharge Summary (Signed)
Physician Discharge Summary  Patient ID: [name] MRN: [id number] DOB/AGE: 11/23/1940 70 y.o.  Admit date: 09/19/2011 Discharge date: 10/05/2011  Primary Discharge Diagnosis:Atrial Fib/Flutter Secondary Discharge Diagnosis 1. Tikosyn Loading 2. Dyspnea 3. Hypertension 4. Cough chronic 5. Diastolic CHF Significant Diagnostic Studies:  Consults: Pulmonologist-Dr.Simonds  Hospital Course: [name] is a 70 year old female patient of Dr. Sherryl Manges with a history of atrial fibrillation recently started on flecainide. She subsequently found to be in atrial flutter on 09/09/2011. She underwent DC C. cardioversion on 09/10/2011 and remains sinus rhythm for only a few hours and reverting back to atrial flutter. On admission she was found to be in atrial flutter with a rate of 113 beats per minute otherwise stable. She is using her bathroom at home and felt very short of breath without chest pain she called 911 and was taken to [facility]. She is found to have mild elevation in her Pro-BNP and pulmonary edema per chest x-ray. She was also found to be hypokalemic with a potassium of 3.3. She is diuresis with IV Lasix and change to by mouth Lasix 40 mg twice a day.     The patient had a cardioversion per Dr. Ladona Ridgel on 09/24/2011  that was unsuccessful. The patient returned to atrial flutter and was subsequently discontinued off of flecainide and started on Tikyson with bloating and careful evaluation of the EKGs and QT C. intervals. The following day she had increased coughing along with discolored mucus marked dyspnea with low-grade fever and leukocytosis. Pulmonology was consulted Dr. Sharol Harness. He ruled out pneumonia and subsequently stopped antibiotics that were started prophylactically. She was however placed on cough suppressants and expectorants. There were episodes also of diarrhea which revealed negative C. difficile on testing.     During hospitalization the patient was covered with  IV heparin post ablation and started on by mouth Coumadin. She'll return to her dose of Coumadin prior to admission and followup in the Coumadin clinic in our office as an outpatient. She continued to improve IV Lasix was discontinued she was also stable with potassium level. She was seen by social services for assistance on Tikosyn as she was unable to afford this as an outpatient. She was given 7 days of Tikosyn from [facility] along with a home prescription. She was also set up for home health nurse to continue to follow her with possible home physical therapy as well. She is to followup with Dr. Sherryl Manges in one week in his office for continued evaluation and treatment of atrial fib flutter along with Coumadin check.       Discharge Exam:  Blood pressure 111/60, pulse 94, temperature 97.5 F (36.4 C), temperature source Oral, resp. rate 18, height 5\' 1"  (1.549 m), weight 260 lb 12.9 oz (118.3 kg), SpO2 96.00%.   Please see Dr. Alford Highland note for day of discharge.  Labs:   Lab Results  Component Value Date   WBC 12.2* 10/05/2011   HGB 14.3 10/05/2011   HCT 44.1 10/05/2011   MCV 83.5 10/05/2011   PLT 328 10/05/2011     Lab 10/05/11 0540  NA 136  K 4.0  CL 97  CO2 25  BUN 37*  CREATININE 1.10  CALCIUM 9.6  PROT --  BILITOT --  ALKPHOS --  ALT --  AST --  GLUCOSE 121*   Lab Results  Component Value Date   CKTOTAL 102 09/26/2011   CKMB 3.4 09/26/2011   TROPONINI 0.68* 09/26/2011  Lab Results  Component Value Date   CHOL 157 07/04/2010   CHOL  Value: 157        ATP III CLASSIFICATION:  <200     mg/dL   Desirable  914-782  mg/dL   Borderline High  >=956    mg/dL   High        21/30/8657   CHOL 154 06/04/2009   Lab Results  Component Value Date   HDL 61 07/04/2010   HDL 54 84/69/6295   HDL 58 06/04/2009   Lab Results  Component Value Date   LDLCALC 61 07/04/2010   LDLCALC  Value: 76        Total Cholesterol/HDL:CHD Risk Coronary Heart Disease Risk Table                      Men   Women  1/2 Average Risk   3.4   3.3  Average Risk       5.0   4.4  2 X Average Risk   9.6   7.1  3 X Average Risk  23.4   11.0        Use the calculated Patient Ratio above and the CHD Risk Table to determine the patient's CHD Risk.        ATP III CLASSIFICATION (LDL):  <100     mg/dL   Optimal  284-132  mg/dL   Near or Above                    Optimal  130-159  mg/dL   Borderline  440-102  mg/dL   High  >725     mg/dL   Very High 36/64/4034   LDLCALC 61 06/04/2009   Lab Results  Component Value Date   TRIG 173* 07/04/2010   TRIG 135 10/11/2009   TRIG 176* 06/04/2009   Lab Results  Component Value Date   CHOLHDL 2.6 Ratio 07/04/2010   CHOLHDL 2.9 10/11/2009   CHOLHDL 2.7 Ratio 06/04/2009   No results found for this basename: LDLDIRECT      Radiology: Dg Chest 2 View  09/29/2011  *RADIOLOGY REPORT*  Clinical Data: Cough.  CHF.  Pneumonia.  CHEST - 2 VIEW  Comparison: Chest x-ray 09/25/2011.  Findings: The cardiac silhouette, mediastinal and hilar contours are stable.  Much improved lung aeration with resolution of CHF and infiltrates.  Minimal residual areas of atelectasis.  No pneumothorax or pleural effusion.  IMPRESSION: Much improved lung aeration with minimal residual areas of atelectasis.  Original Report Authenticated By: P. Loralie Champagne, M.D.   Dg Chest 2 View  09/23/2011  *RADIOLOGY REPORT*  Clinical Data: Follow up pleural effusion  CHEST - 2 VIEW  Comparison: 09/19/2011  Findings: Heart size is moderately enlarged.  There is a scar within the right midlung which is unchanged from previous exam.  Pulmonary venous congestion appears improved from previous exam.  There is no pleural effusion noted.  IMPRESSION:  1.  Improvement in pulmonary venous congestion.  Original Report Authenticated By: Rosealee Albee, M.D.   Dg Chest 2 View  09/19/2011  *RADIOLOGY REPORT*  Clinical Data: Shortness of breath and right neck pain.  CHEST - 2 VIEW  Comparison: Chest radiograph  performed 10/14/2010  Findings: The lungs are well-aerated.  Vascular congestion is noted, with diffusely increased interstitial markings, most compatible with pulmonary edema.  No significant pleural effusion or pneumothorax is seen.  The heart is mildly enlarged; calcification is noted within the  aortic arch.  No acute osseous abnormalities are seen.  IMPRESSION: Vascular congestion and mild cardiomegaly, with diffusely increased interstitial markings, most compatible with pulmonary edema.  Original Report Authenticated By: Tonia Ghent, M.D.   Dg Chest Port 1 View  09/25/2011  *RADIOLOGY REPORT*  Clinical Data: Shortness of breath.  PORTABLE CHEST - 1 VIEW  Comparison: Chest radiograph performed 09/23/2011  Findings: The lungs are well-aerated.  Diffuse bilateral airspace opacification is noted, with sparing of the lung apices.  This is most prominent at the right perihilar region, and may reflect significant multifocal pneumonia or pulmonary edema.  A small right pleural effusion is noted.  No pneumothorax is seen.  Underlying vascular congestion is appreciated.  The cardiomediastinal silhouette is mildly enlarged.  No acute osseous abnormalities are seen.  IMPRESSION:  1.  Diffuse bilateral airspace opacification, with sparing of the lung apices.  This is most prominent at the right perihilar region, and may reflect significant multifocal pneumonia or pulmonary edema.  Small right pleural effusion noted. 2.  Underlying vascular congestion and mild cardiomegaly.  Original Report Authenticated By: Tonia Ghent, M.D.    EKG:NSR rate of 81 bpm. QT/QTc 350/406  FOLLOW UP PLANS AND APPOINTMENTS Discharge Orders    Future Appointments: Provider: Department: Dept Phone: Center:   10/24/2011 3:30 PM Duke Salvia, MD [facility] [phone] [facility]   07/12/2012 9:00 AM Barbaraann Share, MD [facility] [phone] None     Current Discharge Medication List    START taking these  medications   Details  ALPRAZolam (XANAX) 0.25 MG tablet Take 1 tablet (0.25 mg total) by mouth 2 (two) times daily as needed for anxiety. Qty: 30 tablet, Refills: 0    diltiazem (CARDIZEM CD) 120 MG 24 hr capsule Take 1 capsule (120 mg total) by mouth daily. Qty: 30 capsule, Refills: 10    !! dofetilide (TIKOSYN) 500 MCG capsule Take 1 capsule (500 mcg total) by mouth every 12 (twelve) hours. Qty: 30 capsule, Refills: 6    !! dofetilide (TIKOSYN) 500 MCG capsule Take 1 capsule (500 mcg total) by mouth 2 (two) times daily. Qty: 14 capsule, Refills: 0    furosemide (LASIX) 40 MG tablet Take 1 tablet (40 mg total) by mouth 2 (two) times daily. Qty: 30 tablet, Refills: 6    guaiFENesin-dextromethorphan (ROBITUSSIN DM) 100-10 MG/5ML syrup Take 5 mLs by mouth every 4 (four) hours as needed for cough. Qty: 118 mL, Refills: 1     !! - Potential duplicate medications found. Please discuss with provider.    CONTINUE these medications which have CHANGED   Details  metoprolol tartrate (LOPRESSOR) 25 MG tablet Take 1 tablet (25 mg total) by mouth 2 (two) times daily. Qty: 60 tablet, Refills: 10      CONTINUE these medications which have NOT CHANGED   Details  colchicine (COLCRYS) 0.6 MG tablet Take 0.6 mg by mouth daily as needed. For gout    HYDROcodone-acetaminophen (NORCO) 10-325 MG per tablet Take 1 tablet by mouth every 6 (six) hours as needed. For pain    lisinopril (PRINIVIL,ZESTRIL) 40 MG tablet Take 40 mg by mouth daily.      pravastatin (PRAVACHOL) 80 MG tablet Take 80 mg by mouth daily.     warfarin (COUMADIN) 5 MG tablet Take 2.5-5 mg by mouth daily. 1 tablet every day except take 1/2 tablet on wednesdays      STOP taking these medications     diltiazem (TIAZAC) 240 MG 24 hr capsule  flecainide (TAMBOCOR) 100 MG tablet      potassium chloride (K-DUR) 10 MEQ tablet      triamterene-hydrochlorothiazide (MAXZIDE-25) 37.5-25 MG per tablet        Follow-up  Information    Follow up with [facility]. [facility] Registered Nurse for Heart Failure Home Health Screen  as needed)    Contact information:   [phone]      Follow up with Sherryl Manges, MD. (Our office will call you for this appointment)    Contact information:   [address], [address] [place] [address] [phone]            Time spent with patient to include physician time: 50 minutes. Signed: Joni Reining 10/05/2011, 12:23 PM Co-Sign MD

## 2011-10-05 NOTE — Progress Notes (Signed)
ANTICOAGULATION CONSULT NOTE - Follow Up Consult  Pharmacy Consult for Coumadin Indication: atrial fibrillation  No Known Allergies  Patient Measurements: Height:  (5 feet 1 inch) Weight: 260 lb 12.9 oz (118.3 kg) IBW/kg (Calculated) : 47.8    Vital Signs: Temp: 97.5 F (36.4 C) (12/16 0500) BP: 111/60 mmHg (12/16 0500) Pulse Rate: 94  (12/16 0500)  Labs:  Basename 10/05/11 0540 10/04/11 1036 10/04/11 0600 10/03/11 0535  HGB 14.3 -- -- --  HCT 44.1 -- -- --  PLT 328 -- -- --  APTT -- -- -- --  LABPROT 27.3* -- 26.5* 24.1*  INR 2.49* -- 2.39* 2.12*  HEPARINUNFRC -- -- -- --  CREATININE 1.10 1.19* 0.85 --  CKTOTAL -- -- -- --  CKMB -- -- -- --  TROPONINI -- -- -- --   Estimated Creatinine Clearance: 59.5 ml/min (by C-G formula based on Cr of 1.1).  Assessment: 69yof on Coumadin for Afib. INR (2.43) is therapeutic on home regimen - will continue.   Goal of Therapy:  INR 2-3   Plan:  1. Continue Coumadin home regimen (5mg  daily except 2.5mg  on Wed) - 5mg  due today 2. Follow-up AM INR if not discharged home today.  Woodfin Kiss,PharmD [phone] 10/05/2011,8:46 AM

## 2011-10-05 NOTE — Plan of Care (Signed)
Problem: Discharge Progression Outcomes Goal: Other Discharge Outcomes/Goals Outcome: Completed/Met Date Met:  10/05/11 HH RN arranged with [facility]

## 2011-10-05 NOTE — Progress Notes (Signed)
CARE MANAGEMENT NOTE 10/05/2011  Patient:  [name],[name]   Account Number:  [id number]  Date Initiated:  09/19/2011  Documentation initiated by:  SUITS,TERI  Subjective/Objective Assessment:   Pt is 70 yr old seen in the ED per request to arrange home health CHF program.     Action/Plan:   Facilitate referral to home health CHF program   Anticipated DC Date:  09/24/2011   Anticipated DC Plan:  HOME W HOME HEALTH SERVICES      DC Planning Services  CM consult      [facility] Choice  HOME HEALTH   Choice offered to / List presented to:  C-1 Patient        HH arranged  HH-10 DISEASE MANAGEMENT  HH-1 RN      [facility] agency  [facility]   Status of service:  Completed, signed off Medicare Important Message given?  NA - LOS <3 / Initial given by admissions (If response is "NO", the following Medicare IM given date fields will be blank) Date Medicare IM given:   Date Additional Medicare IM given:    Discharge Disposition:  HOME W HOME HEALTH SERVICES  Per UR Regulation:  Reviewed for med. necessity/level of care/duration of stay  Comments:  10/05/2011 1145 Contact [facility] to make aware of pt's scheduled d/c today. Will send 7 day Rx of Tikosyn to [facility] main pharmacy. Unit RN will pick up from main pharmacy. Explained to pt to take Rx so med can be ordered from her pharmacy. Pt requesting additional info on side effects of medication. Made pt's Unit RN, Vernona Rieger aware of need for teaching. Isidoro Donning RN CCM Case Mgmt phone [phone]

## 2011-10-06 ENCOUNTER — Telehealth: Payer: Self-pay | Admitting: *Deleted

## 2011-10-06 NOTE — Telephone Encounter (Signed)
Per Dr. Graciela Husbands, he did not order hydrocodone on the patient. He also thinks the pravastatin was on formulary at the hospital and she may resume Crestor at home if that is what she was taking. He is also uncertain of what [employer] is. I called and explained these things to [facility]. She will relay to this to the patient. They will set her up for tele-monitoring through there agency, since we are unsure of what Med[facility] is.

## 2011-10-06 NOTE — Telephone Encounter (Signed)
Per Archie Patten with home health. The patient is recently home from [facility]. She was discharged on hydrocodone/APAP 10/325mg  one tablet by mouth every 6 hours and Pravastatin 80mg  once daily. She did not receive prescriptions for either one of these meds. It also states that she will start [facility]. She has the equipment for this, but is not set up for it. I will review with Dr. Graciela Husbands and call her back.

## 2011-10-07 ENCOUNTER — Telehealth: Payer: Self-pay | Admitting: Internal Medicine

## 2011-10-07 NOTE — Telephone Encounter (Signed)
Spoke with pt, she was discharged from the hosp Sunday and last night and today with any exertion she notices her heart races. Pt is currently taking tikosyn 0.5 mg bid, klor-con 20 meq qd,  Furosemide 40 mg bid, metoprolol 25 mg bid, lisinopril 40 mg qd, diltiazem 120 mg qd. She does get SOB when her heart rate is elevated. She reports the home health nurse said her heart rate today was 56/60. The pt has a monitor at home but it is past time to turn it in and wants to know if she should wear it again. She has a follow up with dr Graciela Husbands 10-28-11. Will forward for dr Graciela Husbands review

## 2011-10-07 NOTE — Telephone Encounter (Signed)
Wants to talk to someone regarding getting a monitor she just was released from the hospital

## 2011-10-08 ENCOUNTER — Telehealth: Payer: Self-pay | Admitting: Internal Medicine

## 2011-10-08 NOTE — Telephone Encounter (Signed)
I spoke with the patient. She states that she notices that every time she coughs, her heart rate goes up. She was uncertain about whether or not she should be on flecainide with the tikosyn. I explained that she should not be taking flecainide right now. She continues with a cough that is very bothersome and she wanted to know should she address this with her PCP. I advised that she should discuss that with her PCP. In her discharge summary, she is to follow up with Dr Graciela Husbands in a week. She is not scheduled to see him until 10/24/11. I advised I will discuss with Dr. Graciela Husbands to see if he wants to see her back before he is out next week. I will call her back today.

## 2011-10-08 NOTE — Telephone Encounter (Signed)
I will review with Dr. Graciela Husbands. The patient has called again and another encounter has been opened I will close this encounter.

## 2011-10-08 NOTE — Telephone Encounter (Signed)
New message:  Patient needs to discuss her medication since discharge from the hospital on Monday.

## 2011-10-10 ENCOUNTER — Ambulatory Visit (INDEPENDENT_AMBULATORY_CARE_PROVIDER_SITE_OTHER): Payer: Medicare Other | Admitting: Internal Medicine

## 2011-10-10 ENCOUNTER — Telehealth: Payer: Self-pay | Admitting: Internal Medicine

## 2011-10-10 ENCOUNTER — Encounter: Payer: Self-pay | Admitting: *Deleted

## 2011-10-10 ENCOUNTER — Encounter: Payer: Self-pay | Admitting: Internal Medicine

## 2011-10-10 VITALS — BP 152/68 | HR 125 | Ht 61.0 in | Wt 257.0 lb

## 2011-10-10 DIAGNOSIS — I4891 Unspecified atrial fibrillation: Secondary | ICD-10-CM

## 2011-10-10 DIAGNOSIS — I5032 Chronic diastolic (congestive) heart failure: Secondary | ICD-10-CM

## 2011-10-10 DIAGNOSIS — I509 Heart failure, unspecified: Secondary | ICD-10-CM

## 2011-10-10 DIAGNOSIS — I498 Other specified cardiac arrhythmias: Secondary | ICD-10-CM

## 2011-10-10 DIAGNOSIS — R001 Bradycardia, unspecified: Secondary | ICD-10-CM

## 2011-10-10 MED ORDER — AMIODARONE HCL 200 MG PO TABS: ORAL_TABLET | ORAL | Status: DC

## 2011-10-10 MED ORDER — METOPROLOL TARTRATE 50 MG PO TABS: 50.0000 mg | ORAL_TABLET | Freq: Two times a day (BID) | ORAL | Status: DC

## 2011-10-10 MED ORDER — DILTIAZEM HCL ER COATED BEADS 120 MG PO CP24: 120.0000 mg | ORAL_CAPSULE | Freq: Two times a day (BID) | ORAL | Status: DC

## 2011-10-10 NOTE — Assessment & Plan Note (Signed)
See above

## 2011-10-10 NOTE — Assessment & Plan Note (Signed)
The patient has recurrent atrial fibrillation despite the Tikosyn. We had seen evidence of this in the hospital. We'll stop the Tikosyn and amiodarone. Because of drug interactions, we'll decrease her warfarin 5 daily and to have a Wednesday to 2 and half Monday Wednesday Friday. We'll increase her rate control by doubling her metoprolol and her 2 cousin. Because of bradycardia the hospital, will have her hold these in anticipation of cardioversion.

## 2011-10-10 NOTE — Telephone Encounter (Signed)
Follow up from previous   Discuss medication. Pt was seen today.

## 2011-10-10 NOTE — Patient Instructions (Addendum)
Your physician recommends that you schedule a follow-up appointment in: 3 months with Dr Graciela Husbands  Your physician has recommended that you have a Cardioversion (DCCV). Electrical Cardioversion uses a jolt of electricity to your heart either through paddles or wired patches attached to your chest. This is a controlled, usually prescheduled, procedure. Defibrillation is done under light anesthesia in the hospital, and you usually go home the day of the procedure. This is done to get your heart back into a normal rhythm. You are not awake for the procedure. Please see the instruction sheet given to you today.  DO NOT TAKE YOUR DILTIAZEM OR LOPRESSOR THE NIGHT BEFORE AND THE AM OF THE CARDIOVERSION  Your physician has recommended you make the following change in your medication:  1) Decrease Warfarin to 1/2 dose of M/W/F 2) Start Amiodarone 400mg  twice daily 3) Increase Lopressor to 50mg  twoce daily 4) Increase Diltiazem to 120mg  twice daily 5) STOP Tikosyn 6) 10/15/11  Labs here at [facility] at 1pm--pre-cardioversion with a TSH

## 2011-10-10 NOTE — Progress Notes (Signed)
HPI  [name] is a 70 y.o. female With recurrent atrial arrhythmias who was admitted for congestive heart failure recently and started on Tikosyn. She is having breakthroughs, but she wanted to try Tikosyn. She comes in today with recurrent tachypalpitations.  Past Medical History  Diagnosis Date  . Atrial fibrillation /flutter   . Hypertension   . Diabetes mellitus   . Hyperlipidemia   . Gout   . 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 Diastolic CHF, chronic     echo 2006 - ef 55-65%; mild diast dysfxn  . Arthritis   . Morbid obesity   . OSA (obstructive sleep apnea)     uses cpap  . CHF (congestive heart failure)   . Ejection fraction     EF 60%, echo, September 19, 2011  . Back pain     Past Surgical History  Procedure Date  . Cholecystectomy   . Total abdominal hysterectomy   . Appendectomy   . Tonsillectomy 1982    Current Outpatient Prescriptions  Medication Sig Dispense Refill  . ALPRAZolam (XANAX) 0.25 MG tablet Take 1 tablet (0.25 mg total) by mouth 2 (two) times daily as needed for anxiety.  30 tablet  0  . colchicine (COLCRYS) 0.6 MG tablet Take 0.6 mg by mouth daily as needed. For gout      . dextromethorphan-guaiFENesin (ROBITUSSIN-DM) 10-100 MG/5ML liquid Take 5 mLs by mouth every 4 (four) hours as needed.        . diltiazem (CARDIZEM CD) 120 MG 24 hr capsule Take 1 capsule (120 mg total) by mouth daily.  30 capsule  10  . dofetilide (TIKOSYN) 500 MCG capsule Take 1 capsule (500 mcg total) by mouth every 12 (twelve) hours.  30 capsule  6  . dofetilide (TIKOSYN) 500 MCG capsule Take 1 capsule (500 mcg total) by mouth 2 (two) times daily.  14 capsule  0  . furosemide (LASIX) 40 MG tablet Take 1 tablet (40 mg total) by mouth 2 (two) times daily.  30 tablet  6  . lidocaine (LIDODERM) 5 % Place 1 patch onto the skin daily. Remove & Discard patch within 12 hours or as directed by MD       . lisinopril (PRINIVIL,ZESTRIL) 40 MG tablet  Take 40 mg by mouth daily.        . metoprolol tartrate (LOPRESSOR) 25 MG tablet Take 1 tablet (25 mg total) by mouth 2 (two) times daily.  60 tablet  10  . oxyCODONE-acetaminophen (PERCOCET) 5-325 MG per tablet Take 1 tablet by mouth every 4 (four) hours as needed.        . potassium chloride SA (K-DUR,KLOR-CON) 20 MEQ tablet Take 2 tablets (40 mEq total) by mouth daily.  30 tablet  6  . potassium chloride SA (K-DUR,KLOR-CON) 20 MEQ tablet Take 40 mEq by mouth daily.        . pravastatin (PRAVACHOL) 80 MG tablet Take 80 mg by mouth daily.       . promethazine-codeine (PHENERGAN WITH CODEINE) 6.25-10 MG/5ML syrup Take 5 mLs by mouth every 4 (four) hours as needed.        . warfarin (COUMADIN) 5 MG tablet Take 2.5-5 mg by mouth daily. 1 tablet every day except take 1/2 tablet on wednesdays        No Known Allergies  Review of Systems negative except from HPI and PMH  Physical Exam Well developed and well nourished in no acute distress HENT   normal E scleral and icterus clear Neck Supple JVP flat; carotids brisk and full Clear to ausculation irregularly irregular 2/6 systolic murmurs gallops or rub Soft with active bowel sounds No clubbing cyanosis none Edema Alert and oriented, grossly normal motor and sensory function Skin Warm and Dry  ECG demonstrates atrial fibrillation with a rapid ventricular response  Assessment and  Plan  

## 2011-10-10 NOTE — Telephone Encounter (Signed)
New msg Pt just left office She thought she was supposed to stop tikosyn. Please call he back

## 2011-10-10 NOTE — Telephone Encounter (Signed)
Patient wants to clarify that she is to stop the Tikosyn medication and to start Amiodarone 200 mg 2 tablets twice a day , question was  verified with  Dennis Bast RN. Patient aware, she verbalized understanding.  [name]

## 2011-10-10 NOTE — Assessment & Plan Note (Signed)
Relatively stable. I worry however, about potential to redevelop heart failure given her rapid atrial fibrillation. She is advised to go to hospital her symptoms worsen

## 2011-10-13 ENCOUNTER — Telehealth: Payer: Self-pay | Admitting: Internal Medicine

## 2011-10-13 NOTE — Telephone Encounter (Signed)
Pt was discharged a week ago and she had gout over the weekend a her heart is out of rhythm this morning her b/p was 168/90 pulse was 78 sats was 97%, resp was 16 Temp was 98 and she feels heaviness in her chest but no other symptoms and she was wondering if she needed to do anything like come in the office to seen or just wait for a while to see if it gets worse

## 2011-10-13 NOTE — Telephone Encounter (Signed)
I talked with [name] from [place]. She was at the pt's house. Pt denies heaviness in her chest or SOB.  Her pulse is 78 and her O2 sat is 97%. Pt is having some pain from gout. I suggested pt call Dr Clelia Croft if she feels she needs further treatment for gout. Pt will recheck her BP. Pt feels fine except for pain from gout.

## 2011-10-15 ENCOUNTER — Encounter: Payer: Self-pay | Admitting: Internal Medicine

## 2011-10-15 ENCOUNTER — Encounter (HOSPITAL_COMMUNITY): Payer: Self-pay

## 2011-10-15 ENCOUNTER — Other Ambulatory Visit (INDEPENDENT_AMBULATORY_CARE_PROVIDER_SITE_OTHER): Payer: Medicare Other | Admitting: *Deleted

## 2011-10-15 DIAGNOSIS — I4891 Unspecified atrial fibrillation: Secondary | ICD-10-CM

## 2011-10-15 LAB — CBC WITH DIFFERENTIAL/PLATELET
Basophils Relative: 0.6 % (ref 0.0–3.0)
Eosinophils Absolute: 0.3 10*3/uL (ref 0.0–0.7)
Eosinophils Relative: 2.6 % (ref 0.0–5.0)
Hemoglobin: 12.7 g/dL (ref 12.0–15.0)
Lymphocytes Relative: 24.5 % (ref 12.0–46.0)
MCHC: 33.5 g/dL (ref 30.0–36.0)
Neutro Abs: 6.1 10*3/uL (ref 1.4–7.7)
Neutrophils Relative %: 60.6 % (ref 43.0–77.0)
RBC: 4.55 Mil/uL (ref 3.87–5.11)
WBC: 10.1 10*3/uL (ref 4.5–10.5)

## 2011-10-15 LAB — PROTIME-INR: Prothrombin Time: 32.7 s — ABNORMAL HIGH (ref 10.2–12.4)

## 2011-10-15 LAB — BASIC METABOLIC PANEL
BUN: 18 mg/dL (ref 6–23)
CO2: 32 mEq/L (ref 19–32)
Chloride: 104 mEq/L (ref 96–112)
Creatinine, Ser: 0.7 mg/dL (ref 0.4–1.2)
Glucose, Bld: 121 mg/dL — ABNORMAL HIGH (ref 70–99)
Potassium: 3.7 mEq/L (ref 3.5–5.1)

## 2011-10-16 ENCOUNTER — Ambulatory Visit (HOSPITAL_COMMUNITY)
Admission: RE | Admit: 2011-10-16 | Discharge: 2011-10-16 | Disposition: A | Discharge status: Home / Self Care | Payer: Medicare Other | Source: Ambulatory Visit | Attending: Family Medicine | Admitting: Family Medicine

## 2011-10-16 DIAGNOSIS — M546 Pain in thoracic spine: Secondary | ICD-10-CM | POA: Insufficient documentation

## 2011-10-16 DIAGNOSIS — G8929 Other chronic pain: Secondary | ICD-10-CM

## 2011-10-16 DIAGNOSIS — M5380 Other specified dorsopathies, site unspecified: Secondary | ICD-10-CM | POA: Insufficient documentation

## 2011-10-16 DIAGNOSIS — R1011 Right upper quadrant pain: Secondary | ICD-10-CM | POA: Insufficient documentation

## 2011-10-16 DIAGNOSIS — J90 Pleural effusion, not elsewhere classified: Secondary | ICD-10-CM | POA: Insufficient documentation

## 2011-10-20 ENCOUNTER — Other Ambulatory Visit: Payer: Self-pay | Admitting: *Deleted

## 2011-10-20 DIAGNOSIS — I4891 Unspecified atrial fibrillation: Secondary | ICD-10-CM

## 2011-10-22 ENCOUNTER — Ambulatory Visit (HOSPITAL_COMMUNITY)
Admission: RE | Admit: 2011-10-22 | Discharge: 2011-10-22 | Disposition: A | Discharge status: Home / Self Care | Payer: Medicare Other | Source: Ambulatory Visit | Attending: Internal Medicine | Admitting: Internal Medicine

## 2011-10-22 ENCOUNTER — Encounter (HOSPITAL_COMMUNITY): Payer: Self-pay | Admitting: Certified Registered"

## 2011-10-22 ENCOUNTER — Other Ambulatory Visit: Payer: Self-pay

## 2011-10-22 ENCOUNTER — Encounter (HOSPITAL_COMMUNITY)
Admission: RE | Disposition: A | Discharge status: Home / Self Care | Payer: Self-pay | Source: Ambulatory Visit | Attending: Internal Medicine

## 2011-10-22 ENCOUNTER — Ambulatory Visit (HOSPITAL_COMMUNITY): Payer: Medicare Other | Admitting: Certified Registered"

## 2011-10-22 DIAGNOSIS — I480 Paroxysmal atrial fibrillation: Secondary | ICD-10-CM | POA: Insufficient documentation

## 2011-10-22 DIAGNOSIS — I4891 Unspecified atrial fibrillation: Secondary | ICD-10-CM

## 2011-10-22 DIAGNOSIS — I5032 Chronic diastolic (congestive) heart failure: Secondary | ICD-10-CM | POA: Insufficient documentation

## 2011-10-22 DIAGNOSIS — Z5309 Procedure and treatment not carried out because of other contraindication: Secondary | ICD-10-CM | POA: Insufficient documentation

## 2011-10-22 HISTORY — PX: CARDIOVERSION: SHX1299

## 2011-10-22 SURGERY — CARDIOVERSION
Anesthesia: General | Wound class: Clean

## 2011-10-22 MED ORDER — HYDROCORTISONE 1 % EX CREA
1.0000 "application " | TOPICAL_CREAM | Freq: Three times a day (TID) | CUTANEOUS | Status: DC | PRN
  Filled 2011-10-22: qty 28

## 2011-10-22 MED ORDER — SODIUM CHLORIDE 0.9 % IV SOLN
INTRAVENOUS | Status: DC
  Administered 2011-10-22: 12:00:00 via INTRAVENOUS

## 2011-10-22 MED ORDER — SODIUM CHLORIDE 0.9 % IV SOLN: 250.0000 mL | INTRAVENOUS | Status: DC

## 2011-10-22 MED ORDER — SODIUM CHLORIDE 0.9 % IJ SOLN: 3.0000 mL | INTRAMUSCULAR | Status: DC | PRN

## 2011-10-22 MED ORDER — SODIUM CHLORIDE 0.9 % IJ SOLN: 3.0000 mL | Freq: Two times a day (BID) | INTRAMUSCULAR | Status: DC

## 2011-10-22 NOTE — Interval H&P Note (Signed)
History and Physical Interval Note:  10/22/2011 12:05 PM  [name]  has presented today for surgery, with the diagnosis of AFIB  The various methods of treatment have been discussed with the patient and family. After consideration of risks, benefits and other options for treatment, the patient has consented to  Procedure(s): CARDIOVERSION as a surgical intervention .  The patients' history has been reviewed, patient examined, no change in status, stable for surgery.  I have reviewed the patients' chart and labs.  Questions were answered to the patient's satisfaction.     Sherryl Manges  She is tolerating amio and will undergo  DCCV today

## 2011-10-22 NOTE — H&P (View-Only) (Signed)
HPI  [name] is a 71 y.o. female With recurrent atrial arrhythmias who was admitted for congestive heart failure recently and started on Tikosyn. She is having breakthroughs, but she wanted to try Tikosyn. She comes in today with recurrent tachypalpitations.  Past Medical History  Diagnosis Date  . Atrial fibrillation /flutter   . Hypertension   . Diabetes mellitus   . Hyperlipidemia   . Gout   . 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 Diastolic CHF, chronic     echo 2006 - ef 55-65%; mild diast dysfxn  . Arthritis   . Morbid obesity   . OSA (obstructive sleep apnea)     uses cpap  . CHF (congestive heart failure)   . Ejection fraction     EF 60%, echo, September 19, 2011  . Back pain     Past Surgical History  Procedure Date  . Cholecystectomy   . Total abdominal hysterectomy   . Appendectomy   . Tonsillectomy 1982    Current Outpatient Prescriptions  Medication Sig Dispense Refill  . ALPRAZolam (XANAX) 0.25 MG tablet Take 1 tablet (0.25 mg total) by mouth 2 (two) times daily as needed for anxiety.  30 tablet  0  . colchicine (COLCRYS) 0.6 MG tablet Take 0.6 mg by mouth daily as needed. For gout      . dextromethorphan-guaiFENesin (ROBITUSSIN-DM) 10-100 MG/5ML liquid Take 5 mLs by mouth every 4 (four) hours as needed.        . diltiazem (CARDIZEM CD) 120 MG 24 hr capsule Take 1 capsule (120 mg total) by mouth daily.  30 capsule  10  . dofetilide (TIKOSYN) 500 MCG capsule Take 1 capsule (500 mcg total) by mouth every 12 (twelve) hours.  30 capsule  6  . dofetilide (TIKOSYN) 500 MCG capsule Take 1 capsule (500 mcg total) by mouth 2 (two) times daily.  14 capsule  0  . furosemide (LASIX) 40 MG tablet Take 1 tablet (40 mg total) by mouth 2 (two) times daily.  30 tablet  6  . lidocaine (LIDODERM) 5 % Place 1 patch onto the skin daily. Remove & Discard patch within 12 hours or as directed by MD       . lisinopril (PRINIVIL,ZESTRIL) 40 MG tablet  Take 40 mg by mouth daily.        . metoprolol tartrate (LOPRESSOR) 25 MG tablet Take 1 tablet (25 mg total) by mouth 2 (two) times daily.  60 tablet  10  . oxyCODONE-acetaminophen (PERCOCET) 5-325 MG per tablet Take 1 tablet by mouth every 4 (four) hours as needed.        . potassium chloride SA (K-DUR,KLOR-CON) 20 MEQ tablet Take 2 tablets (40 mEq total) by mouth daily.  30 tablet  6  . potassium chloride SA (K-DUR,KLOR-CON) 20 MEQ tablet Take 40 mEq by mouth daily.        . pravastatin (PRAVACHOL) 80 MG tablet Take 80 mg by mouth daily.       . promethazine-codeine (PHENERGAN WITH CODEINE) 6.25-10 MG/5ML syrup Take 5 mLs by mouth every 4 (four) hours as needed.        . warfarin (COUMADIN) 5 MG tablet Take 2.5-5 mg by mouth daily. 1 tablet every day except take 1/2 tablet on wednesdays        No Known Allergies  Review of Systems negative except from HPI and PMH  Physical Exam Well developed and well nourished in no acute distress HENT  normal E scleral and icterus clear Neck Supple JVP flat; carotids brisk and full Clear to ausculation irregularly irregular 2/6 systolic murmurs gallops or rub Soft with active bowel sounds No clubbing cyanosis none Edema Alert and oriented, grossly normal motor and sensory function Skin Warm and Dry  ECG demonstrates atrial fibrillation with a rapid ventricular response  Assessment and  Plan

## 2011-10-22 NOTE — Transfer of Care (Signed)
Immediate Anesthesia Transfer of Care Note  Patient: [name]  Procedure(s) Performed:  CARDIOVERSION  Patient Location: PACU and Short Stay  Anesthesia Type: MAC  Level of Consciousness: awake  Airway & Oxygen Therapy: Patient Spontanous Breathing  Post-op Assessment: Report given to PACU RN  Post vital signs: stable  Complications: No apparent anesthesia complications

## 2011-10-22 NOTE — Brief Op Note (Signed)
10/22/2011  12:13 PM  PATIENT:  [name]  71 y.o. female  PRE-OPERATIVE DIAGNOSIS:  NSR  POST-OPERATIVE DIAGNOSIS:  NSR  Pt reverted sponateiously to NSR at the arrival of anestehsia md

## 2011-10-22 NOTE — Interval H&P Note (Signed)
History and Physical Interval Note:  10/22/2011 12:10 PM  [name]  has presented today for surgery, with the diagnosis of AFIB  The various methods of treatment have been discussed with the patient and family. After consideration of risks, benefits and other options for treatment, the patient has consented to  Procedure(s): CARDIOVERSION as a surgical intervention .  The patients' history has been reviewed, patient examined, no change in status, stable for surgery.  I have reviewed the patients' chart and labs.  Questions were answered to the patient's satisfaction.     Sherryl Manges  INR therapeutic since early [facility]

## 2011-10-22 NOTE — Anesthesia Preprocedure Evaluation (Addendum)
Anesthesia Evaluation  Patient identified by MRN, date of birth, ID band Patient awake    Airway Mallampati: II TM Distance: >3 FB Neck ROM: Full    Dental  (+) Teeth Intact   Pulmonary sleep apnea ,  clear to auscultation        Cardiovascular hypertension, +CHF + dysrhythmias Atrial Fibrillation Irregular Normal    Neuro/Psych    GI/Hepatic   Endo/Other  Diabetes mellitus-  Renal/GU      Musculoskeletal   Abdominal   Peds  Hematology   Anesthesia Other Findings   Reproductive/Obstetrics                          Anesthesia Physical Anesthesia Plan  ASA: III  Anesthesia Plan: General   Post-op Pain Management:    Induction: Intravenous  Airway Management Planned: Mask  Additional Equipment:   Intra-op Plan:   Post-operative Plan:   Informed Consent: I have reviewed the patients History and Physical, chart, labs and discussed the procedure including the risks, benefits and alternatives for the proposed anesthesia with the patient or authorized representative who has indicated his/her understanding and acceptance.   Dental advisory given  Plan Discussed with: CRNA and Surgeon  Anesthesia Plan Comments:         Anesthesia Quick Evaluation

## 2011-10-22 NOTE — Anesthesia Postprocedure Evaluation (Signed)
  Anesthesia Post-op Note  Patient: [name]  Procedure(s) Performed:  CARDIOVERSION  Patient Location: PACU and Short Stay  Anesthesia Type: MAC  Level of Consciousness: awake, alert  and oriented  Airway and Oxygen Therapy: Patient Spontanous Breathing  Post-op Pain: none  Post-op Assessment: Post-op Vital signs reviewed  Post-op Vital Signs: stable  Complications: No apparent anesthesia complications

## 2011-10-23 ENCOUNTER — Encounter (HOSPITAL_COMMUNITY): Payer: Self-pay | Admitting: Internal Medicine

## 2011-10-24 ENCOUNTER — Ambulatory Visit: Payer: Medicare Other | Admitting: Internal Medicine

## 2011-10-27 ENCOUNTER — Telehealth: Payer: Self-pay | Admitting: Internal Medicine

## 2011-10-27 NOTE — Telephone Encounter (Signed)
New problem Pt said she wanted to talk to you about something personal she wanted ask Dr Graciela Husbands. Please call her back

## 2011-10-27 NOTE — Telephone Encounter (Signed)
Spoke with pt, personal questions answered. She is also wanting to go to [place] to see her great grandchildren. They are going to drive and she wants to make sure dr Graciela Husbands feels she will be okay to make that trip. Will ask dr Graciela Husbands

## 2011-10-28 NOTE — Telephone Encounter (Signed)
Forwarding to Dr. Klein. 

## 2011-10-29 NOTE — Telephone Encounter (Signed)
Follow up from previous.    Patient calling - personal message , no other information was given.

## 2011-10-30 NOTE — Telephone Encounter (Signed)
Forwarding to Dr. Klein. 

## 2011-10-30 NOTE — Telephone Encounter (Signed)
FU Call: Pt calling again wanting to make sure it is safe for pt to travel to [place]. Please return pt call to discuss further.

## 2011-10-30 NOTE — Telephone Encounter (Signed)
OK per Dr. Graciela Husbands to go to [place]. I explained to the patient the importance of frequent stops and moving around. She verbalizes understanding.

## 2011-10-31 ENCOUNTER — Ambulatory Visit: Payer: Medicare Other | Admitting: Internal Medicine

## 2011-11-14 ENCOUNTER — Telehealth: Payer: Self-pay | Admitting: Internal Medicine

## 2011-11-14 ENCOUNTER — Other Ambulatory Visit: Payer: Self-pay | Admitting: *Deleted

## 2011-11-14 DIAGNOSIS — I509 Heart failure, unspecified: Secondary | ICD-10-CM

## 2011-11-14 NOTE — Telephone Encounter (Signed)
Per Dr. Graciela Husbands, advised pt to come in on 11/17/2011 to have EKG and BMET, pt agrees.

## 2011-11-14 NOTE — Telephone Encounter (Signed)
New Problem:    Patient was calling to ask about her potassium chloride SA (K-DUR,KLOR-CON) 20 MEQ tablet and amiodarone (PACERONE) 200 MG tablet dosages.

## 2011-11-17 ENCOUNTER — Other Ambulatory Visit (INDEPENDENT_AMBULATORY_CARE_PROVIDER_SITE_OTHER): Payer: Medicare Other | Admitting: *Deleted

## 2011-11-17 ENCOUNTER — Telehealth: Payer: Self-pay | Admitting: *Deleted

## 2011-11-17 ENCOUNTER — Ambulatory Visit (INDEPENDENT_AMBULATORY_CARE_PROVIDER_SITE_OTHER): Payer: Medicare Other

## 2011-11-17 DIAGNOSIS — I4891 Unspecified atrial fibrillation: Secondary | ICD-10-CM

## 2011-11-17 DIAGNOSIS — I509 Heart failure, unspecified: Secondary | ICD-10-CM

## 2011-11-17 DIAGNOSIS — E876 Hypokalemia: Secondary | ICD-10-CM

## 2011-11-17 DIAGNOSIS — Z79899 Other long term (current) drug therapy: Secondary | ICD-10-CM

## 2011-11-17 DIAGNOSIS — Z5181 Encounter for therapeutic drug level monitoring: Secondary | ICD-10-CM

## 2011-11-17 LAB — BASIC METABOLIC PANEL
BUN: 12 mg/dL (ref 6–23)
CO2: 33 mEq/L — ABNORMAL HIGH (ref 19–32)
Calcium: 8.8 mg/dL (ref 8.4–10.5)
Creatinine, Ser: 0.7 mg/dL (ref 0.4–1.2)
Glucose, Bld: 146 mg/dL — ABNORMAL HIGH (ref 70–99)
Sodium: 141 mEq/L (ref 135–145)

## 2011-11-17 MED ORDER — AMIODARONE HCL 200 MG PO TABS: 200.0000 mg | ORAL_TABLET | Freq: Every day | ORAL | Status: DC

## 2011-11-17 MED ORDER — AMIODARONE HCL 200 MG PO TABS: 200.0000 mg | ORAL_TABLET | ORAL | Status: DC

## 2011-11-17 NOTE — Progress Notes (Signed)
Patient in for an EKG, Patient is taken Pacerone one 200 mg one tablet twice a day instead of 2 tablets twice a day. EKG done per RN and read per Dr. Graciela Husbands Sinus bradycardia 51 beats/ minute. MD recommended for pt  to stop taken the Cardizem CD 120 mg, and to decrease the Pacerone one 200 mg to once a day. Patent to make an appointment with her PCP for B/P issues. Patient aware she verbalized understanding.

## 2011-11-17 NOTE — Telephone Encounter (Signed)
Labs reviewed by DOD Dr. Johney Frame. K+ 3.1  Recommended pt take an extra 40 meq potassium today. Then 40 meq bid Tuesday,Wednesday.   Repeat bmp on Thursday. Pt agrees with plan Mylo Red RN

## 2011-11-18 NOTE — Telephone Encounter (Signed)
Addended by: Sherri Rad C on: 11/18/2011 04:47 PM   Modules accepted: Orders

## 2011-11-18 NOTE — Telephone Encounter (Signed)
Per Dr. Graciela Husbands, labwork also shows mild abnormality in her TSH. He would like to have a free T3 and free T4 drawn. I will add her to labwork for Thursday when she comes for her repeat BMP.

## 2011-11-20 ENCOUNTER — Other Ambulatory Visit (INDEPENDENT_AMBULATORY_CARE_PROVIDER_SITE_OTHER): Payer: Medicare Other | Admitting: *Deleted

## 2011-11-20 DIAGNOSIS — E876 Hypokalemia: Secondary | ICD-10-CM

## 2011-11-20 DIAGNOSIS — I4891 Unspecified atrial fibrillation: Secondary | ICD-10-CM

## 2011-11-20 LAB — BASIC METABOLIC PANEL
CO2: 30 mEq/L (ref 19–32)
Calcium: 9.1 mg/dL (ref 8.4–10.5)
Glucose, Bld: 143 mg/dL — ABNORMAL HIGH (ref 70–99)
Sodium: 143 mEq/L (ref 135–145)

## 2011-11-24 ENCOUNTER — Other Ambulatory Visit: Payer: Self-pay | Admitting: *Deleted

## 2011-11-24 DIAGNOSIS — I4891 Unspecified atrial fibrillation: Secondary | ICD-10-CM

## 2011-11-25 ENCOUNTER — Emergency Department (INDEPENDENT_AMBULATORY_CARE_PROVIDER_SITE_OTHER)
Admission: EM | Admit: 2011-11-25 | Discharge: 2011-11-25 | Disposition: A | Discharge status: Home / Self Care | Payer: Medicare Other | Source: Home / Self Care | Attending: Emergency Medicine | Admitting: Emergency Medicine

## 2011-11-25 ENCOUNTER — Encounter (HOSPITAL_COMMUNITY): Payer: Self-pay | Admitting: Emergency Medicine

## 2011-11-25 DIAGNOSIS — I10 Essential (primary) hypertension: Secondary | ICD-10-CM

## 2011-11-25 DIAGNOSIS — S8000XA Contusion of unspecified knee, initial encounter: Secondary | ICD-10-CM

## 2011-11-25 DIAGNOSIS — S8001XA Contusion of right knee, initial encounter: Secondary | ICD-10-CM

## 2011-11-25 DIAGNOSIS — S7000XA Contusion of unspecified hip, initial encounter: Secondary | ICD-10-CM

## 2011-11-25 DIAGNOSIS — S7001XA Contusion of right hip, initial encounter: Secondary | ICD-10-CM

## 2011-11-25 MED ORDER — CLONIDINE HCL 0.1 MG PO TABS
0.2000 mg | ORAL_TABLET | Freq: Once | ORAL | Status: AC
  Administered 2011-11-25: 0.2 mg via ORAL

## 2011-11-25 MED ORDER — CLONIDINE HCL 0.2 MG PO TABS: 0.1000 mg | ORAL_TABLET | Freq: Two times a day (BID) | ORAL | Status: DC

## 2011-11-25 MED ORDER — CLONIDINE HCL 0.1 MG PO TABS
ORAL_TABLET | ORAL | Status: AC
  Filled 2011-11-25: qty 2

## 2011-11-25 MED ORDER — HYDROCODONE-ACETAMINOPHEN 5-325 MG PO TABS: ORAL_TABLET | ORAL | Status: AC

## 2011-11-25 NOTE — ED Provider Notes (Signed)
History     CSN: [id number]  Arrival date & time 11/25/11  1306   First MD Initiated Contact with Patient 11/25/11 1520      Chief Complaint  Patient presents with  . Knee Pain    (Consider location/radiation/quality/duration/timing/severity/associated sxs/prior treatment) HPI Comments: The patient is a 71 year old female who is in today for a recheck on her blood pressure and also because of an injury to her right hip and right knee.  She has had a high blood pressure for a number of years. She was hospitalized last fall for 17 days with congestive heart failure and pneumonia. She is concerned her blood pressure might be high, so she went to [facility] today where her blood pressure was found to be 204/90. They told her to keep her regular appointment in a month. She was not satisfied with this and came here. She denies any headaches, dizziness, lightheadedness, blurry vision, shortness of breath, chest pain, tightness, pressure, palpitations, syncope, or edema. She is on numerous meds for blood pressure including: Metoprolol, furosemide, and lisinopril. Recently she was on diltiazem but this was discontinued. She has atrial fibrillation was on Coumadin and Pacerone. She also takes pravastatin and potassium.  A week ago she slipped in his snow and fell. She injured her right knee and right hip. She is able to walk and drive a car. The hip and the knee are very sore. There is no bruising or deformity.  Patient is a 71 y.o. female presenting with knee pain.  Knee Pain Pertinent negatives include no chest pain, no headaches and no shortness of breath.    Past Medical History  Diagnosis Date  . Atrial fibrillation /flutter   . Hypertension   . Diabetes mellitus   . Hyperlipidemia   . Gout   . 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 Diastolic CHF, chronic     echo 2006 - ef 55-65%; mild diast dysfxn  . Arthritis   . Morbid obesity   . OSA  (obstructive sleep apnea)     uses cpap  . CHF (congestive heart failure)   . Ejection fraction     EF 60%, echo, September 19, 2011  . Back pain     Past Surgical History  Procedure Date  . Cholecystectomy   . Total abdominal hysterectomy   . Appendectomy   . Tonsillectomy 1982  . Cardioversion 10/22/2011    Procedure: CARDIOVERSION;  Surgeon: Duke Salvia, MD;  Location: [facility] OR;  Service: Cardiovascular;  Laterality: N/A;    Family History  Problem Relation Age of Onset  . Heart disease Father   . Breast cancer Sister   . Cancer Sister     breast    History  Substance Use Topics  . Smoking status: Never Smoker   . Smokeless tobacco: Never Used  . Alcohol Use: No    OB History    Grav Para Term Preterm Abortions TAB SAB Ect Mult Living                  Review of Systems  Respiratory: Negative for cough, chest tightness, shortness of breath and wheezing.   Cardiovascular: Negative for chest pain, palpitations and leg swelling.  Musculoskeletal: Positive for arthralgias. Negative for myalgias, back pain, joint swelling and gait problem.  Skin: Negative for rash and wound.  Neurological: Negative for dizziness, weakness, light-headedness, numbness and headaches.    Allergies  Review of patient's allergies indicates  no known allergies.  Home Medications   Current Outpatient Rx  Name Route Sig Dispense Refill  . AMIODARONE HCL 200 MG PO TABS Oral Take 1 tablet (200 mg total) by mouth daily.    Marland Kitchen CLONIDINE HCL 0.2 MG PO TABS Oral Take 0.5 tablets (0.1 mg total) by mouth 2 (two) times daily. 60 tablet 0  . COLCHICINE 0.6 MG PO TABS Oral Take 0.6 mg by mouth daily as needed. For gout    . FUROSEMIDE 40 MG PO TABS Oral Take 1 tablet (40 mg total) by mouth 2 (two) times daily. 30 tablet 6  . HYDROCODONE-ACETAMINOPHEN 5-325 MG PO TABS  1 to 2 tabs every 4 to 6 hours as needed for pain. 40 tablet 0  . INDOMETHACIN 25 MG PO CAPS Oral Take 25 mg by mouth daily as needed.  For gout     . LIDOCAINE 5 % EX PTCH Transdermal Place 1 patch onto the skin daily as needed. For pain. Remove & Discard patch within 12 hours or as directed by MD    . LISINOPRIL 40 MG PO TABS Oral Take 40 mg by mouth daily.      Marland Kitchen METOPROLOL TARTRATE 50 MG PO TABS Oral Take 1 tablet (50 mg total) by mouth 2 (two) times daily. 60 tablet 10  . OXYCODONE-ACETAMINOPHEN 5-325 MG PO TABS Oral Take 1 tablet by mouth daily as needed. For severe pain.    Marland Kitchen POTASSIUM CHLORIDE CRYS ER 20 MEQ PO TBCR  Take three tablets by mouth in the morning and two tablets by mouth in the evening    . PRAVASTATIN SODIUM 80 MG PO TABS Oral Take 80 mg by mouth daily.     Marland Kitchen PROMETHAZINE-CODEINE 6.25-10 MG/5ML PO SYRP Oral Take 5 mLs by mouth at bedtime as needed. For cough.    . WARFARIN SODIUM 5 MG PO TABS Oral Take 2.5-5 mg by mouth daily. Takes 1/2 tablet (2.5 mg) on Monday, Wednesday and Friday. Take 1 tablet (5 mg) on all other days.      BP 198/82  Pulse 57  Temp(Src) 98.6 F (37 C) (Oral)  Resp 22  SpO2 98%  Physical Exam  Nursing note and vitals reviewed. Constitutional: She is oriented to person, place, and time. She appears well-developed and well-nourished. No distress.  Eyes: Conjunctivae and EOM are normal. Pupils are equal, round, and reactive to light.  Fundoscopic exam:      The right eye shows no arteriolar narrowing, no AV nicking, no exudate, no hemorrhage and no papilledema.       The left eye shows no arteriolar narrowing, no AV nicking, no exudate, no hemorrhage and no papilledema.  Cardiovascular: Normal rate, regular rhythm, normal heart sounds and intact distal pulses.  Exam reveals no gallop and no friction rub.   No murmur heard. Pulmonary/Chest: Effort normal and breath sounds normal. No respiratory distress. She has no wheezes. She has no rales.  Abdominal: Soft. Bowel sounds are normal. She exhibits no distension and no mass. There is no tenderness. There is no rebound and no guarding.   Musculoskeletal: Normal range of motion. She exhibits tenderness. She exhibits no edema.       Exam of the right hip and knee reveal no swelling, bruising, or deformity. Both hip and knee have a full range of motion with no pain. She is able to ambulate with an antalgic gait. There is tenderness to palpation over the patella and also over the iliac crest.  Neurological:  She is alert and oriented to person, place, and time. She has normal strength and normal reflexes. She displays no atrophy. No sensory deficit. She exhibits normal muscle tone.  Skin: Skin is warm and dry. No rash noted. She is not diaphoretic.    ED Course  Procedures (including critical care time)  She was given clonidine 0.2 mg by mouth her blood pressure came down somewhat as demonstrated below. She tolerated this medication well and will be sent home with a prescription for more of the same.  Filed Vitals:   11/25/11 1520 11/25/11 1521 11/25/11 1640 11/25/11 1643  BP: 215/92 203/76 192/80 198/82  Pulse: 62  55 57  Temp: 98.6 F (37 C)     TempSrc: Oral     Resp: 22     SpO2: 98%       Labs Reviewed - No data to display No results found.   1. Hypertension   2. Contusion of right hip   3. Contusion of right knee       MDM  She was sent home with a prescription for clonidine for her blood pressure was told to followup here in a week, since she cannot get into [facility] for another month. I think she needs to be checked in a week. She was also given hydrocodone for the pain.        Roque Lias, MD 11/25/11 [phone]

## 2011-11-25 NOTE — ED Notes (Signed)
Patient drinking sips of water with medicine

## 2011-11-25 NOTE — ED Notes (Signed)
C/o right knee and hip pain.  remembers falling on recent ice, 1 1/2 weeks ago.  Pain in hip and knee started last Thursday.

## 2011-11-26 ENCOUNTER — Telehealth (HOSPITAL_COMMUNITY): Payer: Self-pay | Admitting: *Deleted

## 2011-12-08 ENCOUNTER — Other Ambulatory Visit: Payer: Medicare Other | Admitting: *Deleted

## 2011-12-16 ENCOUNTER — Other Ambulatory Visit: Payer: Medicare Other

## 2011-12-18 ENCOUNTER — Other Ambulatory Visit (INDEPENDENT_AMBULATORY_CARE_PROVIDER_SITE_OTHER): Payer: Medicare Other

## 2011-12-18 DIAGNOSIS — I4891 Unspecified atrial fibrillation: Secondary | ICD-10-CM

## 2011-12-18 LAB — BASIC METABOLIC PANEL
CO2: 33 mEq/L — ABNORMAL HIGH (ref 19–32)
Calcium: 8.7 mg/dL (ref 8.4–10.5)
Glucose, Bld: 123 mg/dL — ABNORMAL HIGH (ref 70–99)
Potassium: 3.2 mEq/L — ABNORMAL LOW (ref 3.5–5.1)
Sodium: 140 mEq/L (ref 135–145)

## 2011-12-19 ENCOUNTER — Telehealth: Payer: Self-pay | Admitting: Internal Medicine

## 2011-12-19 DIAGNOSIS — E876 Hypokalemia: Secondary | ICD-10-CM

## 2011-12-19 NOTE — Telephone Encounter (Signed)
I spoke with the patient regarding her lab results. She states she did not take her increased dose of potassium as previously prescribed of 60 meq in the am and 40 meq in the pm. She states this was a cost issue. I have explained the importance of increasing her dose from 40 meq BID to 60 meq in the am and 40 meq in the pm. She will do this and come for a repeat bmp in 2 weeks and keep her follow up with Dr. Graciela Husbands as scheduled later this month.

## 2011-12-19 NOTE — Telephone Encounter (Signed)
Pt rtn call to heather re meds, pls call

## 2011-12-22 ENCOUNTER — Telehealth: Payer: Self-pay | Admitting: Internal Medicine

## 2011-12-22 NOTE — Telephone Encounter (Signed)
Pt states she is unable to afford taking her potassium.  She wants to know if there is a less expensive brand or if she can take less?  I explained to her why it is important to keep her potassium level normal and that to do this, she needs to take a potassium supplement at the level Dr Graciela Husbands prescribed.

## 2011-12-22 NOTE — Telephone Encounter (Signed)
New msg Pt was calling about potassium. She wants to discuss with you

## 2011-12-23 ENCOUNTER — Emergency Department (INDEPENDENT_AMBULATORY_CARE_PROVIDER_SITE_OTHER)
Admission: EM | Admit: 2011-12-23 | Discharge: 2011-12-23 | Disposition: A | Discharge status: Home / Self Care | Payer: Medicare Other | Source: Home / Self Care | Attending: Emergency Medicine | Admitting: Emergency Medicine

## 2011-12-23 ENCOUNTER — Encounter (HOSPITAL_COMMUNITY): Payer: Self-pay | Admitting: *Deleted

## 2011-12-23 DIAGNOSIS — I10 Essential (primary) hypertension: Secondary | ICD-10-CM

## 2011-12-23 LAB — POCT I-STAT, CHEM 8
BUN: 14 mg/dL (ref 6–23)
Calcium, Ion: 1.12 mmol/L (ref 1.12–1.32)
Chloride: 98 mEq/L (ref 96–112)
Glucose, Bld: 133 mg/dL — ABNORMAL HIGH (ref 70–99)

## 2011-12-23 MED ORDER — CLONIDINE HCL 0.2 MG PO TABS: 0.2000 mg | ORAL_TABLET | Freq: Two times a day (BID) | ORAL | Status: DC

## 2011-12-23 NOTE — ED Notes (Signed)
Pt reports she took her BP at home and got 199/114 about 1115 today when she had a headache.   At 0645 today she took clonidine,, metoprolol, furosemide and lisinopril

## 2011-12-23 NOTE — ED Notes (Signed)
Pt is oriented x3 and speaking full sentences without difficulty breathing.

## 2011-12-23 NOTE — ED Provider Notes (Signed)
Chief Complaint  Patient presents with  . Hypertension    History of Present Illness:  [name] is in today for a recheck on her blood pressure. This morning she checked her blood pressure around 9:30 and it was high at 199/114. It usually runs around 170-180/70-80. She took all of her usual medications at 6:30 AM including lisinopril, metoprolol 50 mg, clonidine 0.1 mg, and furosemide 40 mg in she has a slight headache this morning. She denies blurry vision. She is a little bit short of breath with exertion, but she states this is always the case for her. She denies any chest pain, tightness, or pressure. She had no dizziness or syncope. Her potassium has been low lately and she's on 5 tablets of potassium chloride per day. She was upset by the cost of this medication and she thinks that might have been why her blood pressure is high.  Review of Systems:  Other than noted above, the patient denies any of the following symptoms: Systemic:  No fever, chills, fatigue, weight loss or gain. Respiratory:  No coughing, wheezing, or shortness of breath. Cardiac:  No chest pain, tightness, pressure, palpitations, syncope, or edema. Neuro:  No headache, dizziness, blurred vision, weakness, paresthesias, or strokelike symptoms.  PMFSH:  Past medical history, family history, social history, meds, and allergies were reviewed.  Physical Exam:   Vital signs:  BP 177/58  Pulse 52  Temp(Src) 97.5 F (36.4 C) (Oral)  Resp 20  SpO2 100%  Filed Vitals:   12/23/11 1141 12/23/11 1142 12/23/11 1256  BP: 148/66 123/55 177/58  Pulse:   52  Temp:   97.5 F (36.4 C)  TempSrc:   Oral  Resp:   20  SpO2:   100%    General:  Alert, oriented, in no distress. Lungs:  Breath sounds clear and equal bilaterally.  No wheezes, rales, or rhonchi. Heart:  Regular rhythm, no gallops, murmers, clicks or rubs.  Abdomen:  Soft and flat.  Nontender, no organomegaly or mass.  No pulsatile midline abdominal mass or  bruit. Ext:  No edema, pulses full.  Labs:   Results for orders placed during the hospital encounter of 12/23/11  POCT I-STAT, CHEM 8      Component Value Range   Sodium 140  135 - 145 (mEq/L)   Potassium 3.9  3.5 - 5.1 (mEq/L)   Chloride 98  96 - 112 (mEq/L)   BUN 14  6 - 23 (mg/dL)   Creatinine, Ser 4.09  0.50 - 1.10 (mg/dL)   Glucose, Bld 811 (*) 70 - 99 (mg/dL)   Calcium, Ion 9.14  7.82 - 1.32 (mmol/L)   TCO2 33  0 - 100 (mmol/L)   Hemoglobin 15.6 (*) 12.0 - 15.0 (g/dL)   HCT 95.6  21.3 - 08.6 (%)    Assessment:   Diagnoses that have been ruled out:  None  Diagnoses that are still under consideration:  None  Final diagnoses:  Hypertension    Plan:   1.  The following meds were prescribed:   New Prescriptions   CLONIDINE (CATAPRES) 0.2 MG TABLET    Take 1 tablet (0.2 mg total) by mouth 2 (two) times daily.   2.  The patient was instructed in symptomatic care and handouts were given. 3.  The patient was told to return if becoming worse in any way, if no better in 3 or 4 days, and given some red flag symptoms that would indicate earlier return.  Follow up:  The  patient was told to follow up with her primary care physician at [facility] in 2 weeks.     [name] Lias, MD 12/23/11 [phone]

## 2011-12-23 NOTE — Discharge Instructions (Signed)
Take clonidine 0.2 mg twice daily.  Continue present dose of potassium.  Follow up at [facility] in 2 weeks.

## 2011-12-23 NOTE — Telephone Encounter (Signed)
Will forward to Dr. Klein. 

## 2011-12-29 ENCOUNTER — Encounter: Payer: Self-pay | Admitting: *Deleted

## 2011-12-29 ENCOUNTER — Telehealth: Payer: Self-pay | Admitting: *Deleted

## 2011-12-29 ENCOUNTER — Ambulatory Visit (INDEPENDENT_AMBULATORY_CARE_PROVIDER_SITE_OTHER): Payer: Medicare Other | Admitting: *Deleted

## 2011-12-29 DIAGNOSIS — I4891 Unspecified atrial fibrillation: Secondary | ICD-10-CM

## 2011-12-29 DIAGNOSIS — I10 Essential (primary) hypertension: Secondary | ICD-10-CM

## 2011-12-29 MED ORDER — AMLODIPINE BESYLATE 5 MG PO TABS: 5.0000 mg | ORAL_TABLET | Freq: Every day | ORAL | Status: DC

## 2011-12-29 MED ORDER — POTASSIUM CHLORIDE CRYS ER 20 MEQ PO TBCR: EXTENDED_RELEASE_TABLET | ORAL | Status: DC

## 2011-12-29 NOTE — Telephone Encounter (Signed)
plz check with sally   i assume there is a dirt cheap generic potassium thankss

## 2011-12-29 NOTE — Patient Instructions (Signed)
Your physician has recommended you make the following change in your medication:  1) decrease metoprolol to 50 mg 1/2 tablet by mouth twice daily. 2) start amlodipine (norvasc) 5 mg by mouth once daily.

## 2011-12-29 NOTE — Telephone Encounter (Signed)
Call received from [facility] with Dr. Clelia Croft stating that the patient called them this morning with reports of bradycardia and a pulse of 44-47. She called them last week with reports of elevated BP and they could not see her, so she went to Urgent Care. Olegario Messier feels the patient is having a hard time feeling her pulse anyway. She wanted to know what we advised her to do. I explained I would call the patient directly. Sherri Rad, RN, BSN  I spoke with the patient. She states that her pulse was in the 40's this morning. She states she has had a headache. She had a little dizziness, but associated this with the fact that she was up about 6 times last night to urinate and she didn't sleep well. I asked if her pulse was irregular and she states she has had a hard time finding it. I advised she could come for an EKG today to verify. She is agreeable with this. I have instructed her to bring all of her medication bottles with her today. She voices understanding. Sherri Rad, RN, BSN

## 2011-12-30 NOTE — Telephone Encounter (Signed)
I checked the cost with several pharmacies.  The cheapest cost for the tablets is ~$62/month.  If the patient was willing to change to the liquid form, it would be about ~$47/month.  I relayed the information to the patient and she will let us know if she wants to change to the liquid.

## 2012-01-01 ENCOUNTER — Telehealth: Payer: Self-pay | Admitting: Internal Medicine

## 2012-01-01 DIAGNOSIS — I4891 Unspecified atrial fibrillation: Secondary | ICD-10-CM

## 2012-01-01 DIAGNOSIS — I10 Essential (primary) hypertension: Secondary | ICD-10-CM

## 2012-01-01 NOTE — Telephone Encounter (Signed)
Results reviewed with Dr. Graciela Husbands. He recommends the patient increase amlodipine to 10 mg once daily. She can d/c metoprolol a week from today. She may eventually need PPM placement per Dr. Graciela Husbands. He also recommends a renal artery ultrasound if this has not been pursued before for HTN. She needs to make sure she is wearing her C-PAP as directed. She should follow up in about 10 days to 2 weeks. Sherri Rad, RN, BSN  I spoke with the patient. She is agreeable with increasing amlodipine to 10 mg daily. She will go ahead and take an additional 5 mg now, then start the 10 mg once daily tomorrow. She will d/c her metoprolol in 1 week. In reviewing her chart I did not see where she has ever had a renal US before. She states she does not think she has had this done. She would like to proceed with this. I will order and have scheduling call her. She is wearing her C-PAP, but states she ends up taking it off because of dry mouth. I explained I am not sure if her meds are causing this. I have instructed her to call Dr. Teddy Spike office to see if some type of humidifier can be added to her C-PAP as I am not sure if this is possible. She states she already has an appointment scheduled with Dr. Graciela Husbands on 3/25. We will see her at that time. Sherri Rad, RN, BSN

## 2012-01-01 NOTE — Telephone Encounter (Signed)
I spoke with the patient. She reports a blood pressure reading of 200/88 HR- 55 at 6:30 am (before taking her medications). She took her meds at 6:30 am. At 9:00 am her bp was 152/66 HR- 48 & at 9:05 am she was 162/73 HR- 46. Her only complaint is a light headache. I explained I will review this with Dr. Graciela Husbands and call her back.

## 2012-01-01 NOTE — Telephone Encounter (Signed)
Please return call to patient at hm# [phone]   Patient would like to speak with nurse regarding blood pressure 162/73, Pulse 46 and headache  she can be reached at hm#[phone] today.

## 2012-01-02 ENCOUNTER — Other Ambulatory Visit (INDEPENDENT_AMBULATORY_CARE_PROVIDER_SITE_OTHER): Payer: Medicare Other

## 2012-01-02 ENCOUNTER — Telehealth: Payer: Self-pay | Admitting: Internal Medicine

## 2012-01-02 DIAGNOSIS — E876 Hypokalemia: Secondary | ICD-10-CM

## 2012-01-02 LAB — BASIC METABOLIC PANEL
GFR: 76.87 mL/min (ref >60.00)
Glucose, Bld: 149 mg/dL — ABNORMAL HIGH (ref 70–99)
Potassium: 3.7 mEq/L (ref 3.5–5.1)
Sodium: 139 mEq/L (ref 135–145)

## 2012-01-02 NOTE — Telephone Encounter (Signed)
Faxed last office note to [facility] @ [phone] emg 3/15

## 2012-01-06 ENCOUNTER — Telehealth: Payer: Self-pay | Admitting: Internal Medicine

## 2012-01-06 NOTE — Telephone Encounter (Signed)
New msg: Pt calling wanting to speak with nurse regarding pt recent potassium results. Please return pt call to discuss further.

## 2012-01-06 NOTE — Telephone Encounter (Signed)
I spoke with the patient. 

## 2012-01-09 ENCOUNTER — Encounter: Payer: Self-pay | Admitting: *Deleted

## 2012-01-12 ENCOUNTER — Ambulatory Visit (INDEPENDENT_AMBULATORY_CARE_PROVIDER_SITE_OTHER): Payer: Medicare Other | Admitting: Internal Medicine

## 2012-01-12 ENCOUNTER — Encounter: Payer: Self-pay | Admitting: Internal Medicine

## 2012-01-12 VITALS — BP 133/74 | HR 62 | Ht 61.0 in | Wt 261.0 lb

## 2012-01-12 DIAGNOSIS — I498 Other specified cardiac arrhythmias: Secondary | ICD-10-CM

## 2012-01-12 DIAGNOSIS — I4891 Unspecified atrial fibrillation: Secondary | ICD-10-CM

## 2012-01-12 DIAGNOSIS — R001 Bradycardia, unspecified: Secondary | ICD-10-CM

## 2012-01-12 DIAGNOSIS — I10 Essential (primary) hypertension: Secondary | ICD-10-CM

## 2012-01-12 DIAGNOSIS — I509 Heart failure, unspecified: Secondary | ICD-10-CM

## 2012-01-12 DIAGNOSIS — I5032 Chronic diastolic (congestive) heart failure: Secondary | ICD-10-CM

## 2012-01-12 MED ORDER — POTASSIUM CHLORIDE CRYS ER 10 MEQ PO TBCR: EXTENDED_RELEASE_TABLET | ORAL | Status: DC

## 2012-01-12 MED ORDER — SPIRONOLACTONE 25 MG PO TABS: 25.0000 mg | ORAL_TABLET | Freq: Every day | ORAL | Status: DC

## 2012-01-12 NOTE — Assessment & Plan Note (Signed)
I will augment blood pressure control and diuresis with the addition of Aldactone.  We will decrease her potassium supplementation from 100-50 mEq a day as we also decreased her Lasix from twice a day to once a day

## 2012-01-12 NOTE — Assessment & Plan Note (Signed)
Improved today. We will add Aldactone both for hypertension and for potassium retention

## 2012-01-12 NOTE — Assessment & Plan Note (Signed)
This may be been aggravated by amiodarone; it is better today. Continue to follow. She is now off beta blockers. She may well need pacing.

## 2012-01-12 NOTE — Patient Instructions (Addendum)
Your physician has recommended you make the following change in your medication:  1) Take amiodarone 200 mg one tablet by mouth 5 days a week. 2) Take amlodipine (norvasc) 5 mg one tablet by mouth twice daily. 3) Decrease lasix (furosemide) to 40 mg once daily. 4) Decrease potassium to 10 meq five tablets by mouth once daily. 5) Start spironolactone 25 mg one tablet by mouth once daily.  Your physician recommends that you return for lab work in: 1 week- bmp/liver/tsh   Your physician recommends that you schedule a follow-up appointment in: 3 months with Dr. Graciela Husbands.  Keep your appointment for your renal ultrasound on 01/15/12 at 10:00am.

## 2012-01-12 NOTE — Progress Notes (Signed)
HPI  [name] is a 71 y.o. female Seen in followup for atrial arrhythmias associated with diastolic heart failure. She's been tried on flecainide, dofetilide, and most recently has been on amiodarone. Echocardiogram November 2012 demonstrated normal left ventricular function with mild LVH; atrial chambers were also normal in size   She's had challenges with hypertension as well as hypokalemia. I've warned that potassium is very expensive.  She's also had some nocturnal hallucinations. Past Medical History  Diagnosis Date  . Atrial fibrillation /flutter   . Hypertension   . Diabetes mellitus   . Hyperlipidemia   . Gout   . 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 Diastolic CHF, chronic     echo 2006 - ef 55-65%; mild diast dysfxn  . Arthritis   . Morbid obesity   . OSA (obstructive sleep apnea)     uses cpap  . CHF (congestive heart failure)   . Ejection fraction     EF 60%, echo, September 19, 2011  . Back pain     Past Surgical History  Procedure Date  . Cholecystectomy   . Total abdominal hysterectomy   . Appendectomy   . Tonsillectomy 1982  . Cardioversion 10/22/2011    Procedure: CARDIOVERSION;  Surgeon: Duke Salvia, MD;  Location: [facility] OR;  Service: Cardiovascular;  Laterality: N/A;    Current Outpatient Prescriptions  Medication Sig Dispense Refill  . ALPRAZolam (XANAX) 0.25 MG tablet Take one tablet by mouth twice daily as needed      . amiodarone (PACERONE) 200 MG tablet Take 1 tablet (200 mg total) by mouth daily.      Marland Kitchen amLODipine (NORVASC) 5 MG tablet Take two tablets by mouth once daily      . cloNIDine (CATAPRES) 0.2 MG tablet Take 1 tablet (0.2 mg total) by mouth 2 (two) times daily.  60 tablet  0  . colchicine (COLCRYS) 0.6 MG tablet Take 0.6 mg by mouth daily as needed. For gout      . furosemide (LASIX) 40 MG tablet Take 1 tablet (40 mg total) by mouth 2 (two) times daily.  30 tablet  6  . indomethacin (INDOCIN) 25 MG  capsule Take 25 mg by mouth daily as needed. For gout       . lisinopril (PRINIVIL,ZESTRIL) 40 MG tablet Take 40 mg by mouth daily.        Marland Kitchen oxyCODONE-acetaminophen (PERCOCET) 5-325 MG per tablet Take 1 tablet by mouth daily as needed. For severe pain.      . potassium chloride SA (K-DUR,KLOR-CON) 20 MEQ tablet Take three tablets by mouth in the morning and two tablets by mouth in the evening  150 tablet  6  . pravastatin (PRAVACHOL) 80 MG tablet Take 80 mg by mouth daily.       Marland Kitchen warfarin (COUMADIN) 5 MG tablet Take 2.5-5 mg by mouth daily. Takes 1/2 tablet (2.5 mg) on Monday, Wednesday and Friday. Take 1 tablet (5 mg) on all other days.        No Known Allergies  Review of Systems negative except from HPI and PMH  Physical Exam BP 133/74  Pulse 62  Ht 5\' 1"  (1.549 m)  Wt 261 lb (118.389 kg)  BMI 49.32 kg/m2 Well developed and morbidy obses in no acute distress HENT normal E scleral and icterus clear Neck Supple JVP flat; carotids brisk and full Clear to ausculation Regular rate and rhythm, no murmurs gallops or rub Soft with active  bowel sounds No clubbing cyanosis tr Edema Alert and oriented, grossly normal motor and sensory function Skin Warm and Dry  Echocardiogram demonstrates sinus with 59 Interval 0.17/08/350 Assessment and  Plan

## 2012-01-12 NOTE — Assessment & Plan Note (Signed)
Holding sinus rhythm on amiodarone. She will need amiodarone surveillance laboratories which will obtain next week and she needs a metabolic profile checked.

## 2012-01-15 ENCOUNTER — Encounter (INDEPENDENT_AMBULATORY_CARE_PROVIDER_SITE_OTHER): Payer: Medicare Other

## 2012-01-15 DIAGNOSIS — I10 Essential (primary) hypertension: Secondary | ICD-10-CM

## 2012-01-15 DIAGNOSIS — I701 Atherosclerosis of renal artery: Secondary | ICD-10-CM

## 2012-01-19 ENCOUNTER — Other Ambulatory Visit (INDEPENDENT_AMBULATORY_CARE_PROVIDER_SITE_OTHER): Payer: Medicare Other

## 2012-01-19 DIAGNOSIS — I4891 Unspecified atrial fibrillation: Secondary | ICD-10-CM

## 2012-01-19 DIAGNOSIS — R001 Bradycardia, unspecified: Secondary | ICD-10-CM

## 2012-01-19 DIAGNOSIS — I509 Heart failure, unspecified: Secondary | ICD-10-CM

## 2012-01-19 DIAGNOSIS — I10 Essential (primary) hypertension: Secondary | ICD-10-CM

## 2012-01-19 DIAGNOSIS — I5032 Chronic diastolic (congestive) heart failure: Secondary | ICD-10-CM

## 2012-01-19 DIAGNOSIS — I498 Other specified cardiac arrhythmias: Secondary | ICD-10-CM

## 2012-01-19 LAB — HEPATIC FUNCTION PANEL
Alkaline Phosphatase: 105 U/L (ref 39–117)
Bilirubin, Direct: 0.2 mg/dL (ref 0.0–0.3)
Total Bilirubin: 0.7 mg/dL (ref 0.3–1.2)
Total Protein: 7.7 g/dL (ref 6.0–8.3)

## 2012-01-19 LAB — BASIC METABOLIC PANEL
BUN: 16 mg/dL (ref 6–23)
CO2: 28 mEq/L (ref 19–32)
Chloride: 102 mEq/L (ref 96–112)
Creatinine, Ser: 0.7 mg/dL (ref 0.4–1.2)
Glucose, Bld: 137 mg/dL — ABNORMAL HIGH (ref 70–99)
Potassium: 4.2 mEq/L (ref 3.5–5.1)

## 2012-01-19 LAB — TSH: TSH: 30.63 u[IU]/mL — ABNORMAL HIGH (ref 0.35–5.50)

## 2012-01-21 ENCOUNTER — Telehealth: Payer: Self-pay | Admitting: *Deleted

## 2012-01-21 DIAGNOSIS — E039 Hypothyroidism, unspecified: Secondary | ICD-10-CM

## 2012-01-21 MED ORDER — LEVOTHYROXINE SODIUM 50 MCG PO TABS: 50.0000 ug | ORAL_TABLET | Freq: Every day | ORAL | Status: DC

## 2012-01-21 NOTE — Telephone Encounter (Signed)
I have reviewed the patient's abnormal TSH with Dr. Graciela Husbands. Per Dr. Graciela Husbands- start Synthroid 50 mcg once daily. I have made the patient aware of her results and she is agreeable with starting synthroid. I will send this in to [facility] on [address] and we will recheck her TSH in 4 weeks.

## 2012-01-27 ENCOUNTER — Ambulatory Visit: Payer: Medicare Other | Admitting: *Deleted

## 2012-01-28 ENCOUNTER — Telehealth: Payer: Self-pay | Admitting: Internal Medicine

## 2012-01-28 DIAGNOSIS — I5032 Chronic diastolic (congestive) heart failure: Secondary | ICD-10-CM

## 2012-01-28 DIAGNOSIS — I4891 Unspecified atrial fibrillation: Secondary | ICD-10-CM

## 2012-01-28 DIAGNOSIS — I10 Essential (primary) hypertension: Secondary | ICD-10-CM

## 2012-01-28 DIAGNOSIS — R001 Bradycardia, unspecified: Secondary | ICD-10-CM

## 2012-01-28 MED ORDER — AMIODARONE HCL 200 MG PO TABS: ORAL_TABLET | ORAL | Status: DC

## 2012-01-28 MED ORDER — AMLODIPINE BESYLATE 5 MG PO TABS: ORAL_TABLET | ORAL | Status: DC

## 2012-01-28 NOTE — Telephone Encounter (Signed)
Pt calling re medication question °

## 2012-01-28 NOTE — Telephone Encounter (Signed)
Went over Amlodipine and Pacerone directions with and sent new Rx's to pharmacy

## 2012-02-05 ENCOUNTER — Other Ambulatory Visit (HOSPITAL_COMMUNITY): Payer: Self-pay | Admitting: Adult Health

## 2012-02-18 ENCOUNTER — Ambulatory Visit (INDEPENDENT_AMBULATORY_CARE_PROVIDER_SITE_OTHER): Payer: Medicare Other | Admitting: *Deleted

## 2012-02-18 DIAGNOSIS — E039 Hypothyroidism, unspecified: Secondary | ICD-10-CM

## 2012-02-18 LAB — BASIC METABOLIC PANEL
BUN: 17 mg/dL (ref 6–23)
CO2: 29 mEq/L (ref 19–32)
Calcium: 9.2 mg/dL (ref 8.4–10.5)
Chloride: 101 mEq/L (ref 96–112)
Creatinine, Ser: 0.7 mg/dL (ref 0.4–1.2)
Glucose, Bld: 177 mg/dL — ABNORMAL HIGH (ref 70–99)

## 2012-02-19 ENCOUNTER — Encounter: Payer: Medicare Other | Attending: Family Medicine | Admitting: *Deleted

## 2012-02-19 ENCOUNTER — Encounter: Payer: Self-pay | Admitting: *Deleted

## 2012-02-19 DIAGNOSIS — E119 Type 2 diabetes mellitus without complications: Secondary | ICD-10-CM | POA: Insufficient documentation

## 2012-02-19 DIAGNOSIS — Z713 Dietary counseling and surveillance: Secondary | ICD-10-CM | POA: Insufficient documentation

## 2012-02-19 DIAGNOSIS — I10 Essential (primary) hypertension: Secondary | ICD-10-CM | POA: Insufficient documentation

## 2012-02-19 NOTE — Progress Notes (Signed)
Medical Nutrition Therapy:  Appt start time: 1030 end time:  1130.  Assessment:  Primary concerns today: patient here for diabetes education, assistance with weight loss, and hypertension.   MEDICATIONS: see list;    DIETARY INTAKE:  Usual eating pattern includes 2 meals and 0-1 snacks per day.  Everyday foods include fair variety of most food groups.  Avoided foods include none stated.    24-hr recall:  B ( AM): used to skip, too busy - 2 eggs, dry wheat toast OR Cheerios with skim milk,  1-2, coffee with cream,  Snk ( AM): none  L ( PM): skips if eats breakfast Snk ( PM): occasionally a fresh fruit D ( PM): chicken or other lean meat, starch, vegetables and salad, water Snk ( PM): none Beverages: coffee, water  Usual physical activity: limited due to recent health problems and a bad knee  Estimated energy needs: 1200 calories 135 g carbohydrates 90 g protein 33 g fat  Progress Towards Goal(s):  In progress.   Nutritional Diagnosis:  NI-1.5 Excessive energy intake As related to activity level.  As evidenced by BMI of 49.4 %.    Intervention:  Nutrition counseling provided in terms of evaluating her current eating habits, and looking for ways to increase her activity level.. Plan: Ask [employer] about [employer] program for people over 45 years old Aim for 2 Carb Choices (30 grams) per meal, 0-1 per snack if hungry Bring your meter to next appt so we can discuss If you join the  [facility], consider water exercises if OK with Cardiologist  Handouts given during visit include: Living Well with Diabetes Carb Counting and Food Label handouts Meal Plan Card  Monitoring/Evaluation:  Dietary intake, exercise, reading food labels, and body weight in 4 week(s).

## 2012-02-19 NOTE — Patient Instructions (Addendum)
Plan: Ask [employer] about [employer] program for people over 69 years old Aim for 2 Carb Choices (30 grams) per meal, 0-1 per snack if hungry Bring your meter to next appt so we can discuss If you join the [facility], consider water exercises if OK with Cardiologist

## 2012-02-24 MED ORDER — LEVOTHYROXINE SODIUM 75 MCG PO TABS: 75.0000 ug | ORAL_TABLET | Freq: Every day | ORAL | Status: DC

## 2012-02-24 NOTE — Progress Notes (Signed)
Addended by: Dennis Bast F on: 02/24/2012 10:04 AM   Modules accepted: Orders

## 2012-03-16 ENCOUNTER — Telehealth: Payer: Self-pay | Admitting: Internal Medicine

## 2012-03-16 NOTE — Telephone Encounter (Signed)
I spoke with the patient. She states she was told by one of the nurses she should have her potassium and thyroid rechecked every month. I explained to the patient that once her potassium is stable, we should not need to recheck this so frequently. We will monitor her TSH since her medication was adjusted. She is due to see Dr. Graciela Husbands at the end of June. We will recheck her TSH at that appointment. She is agreeable.

## 2012-03-16 NOTE — Telephone Encounter (Signed)
Please return call to patient request blood work, she can be reached at [phone].

## 2012-03-17 ENCOUNTER — Ambulatory Visit: Payer: Medicare Other | Admitting: *Deleted

## 2012-03-19 ENCOUNTER — Emergency Department (HOSPITAL_COMMUNITY)
Admission: EM | Admit: 2012-03-19 | Discharge: 2012-03-19 | Disposition: A | Discharge status: Home / Self Care | Payer: Medicare Other | Attending: Emergency Medicine | Admitting: Emergency Medicine

## 2012-03-19 ENCOUNTER — Emergency Department (HOSPITAL_COMMUNITY): Payer: Medicare Other

## 2012-03-19 ENCOUNTER — Encounter (HOSPITAL_COMMUNITY): Payer: Self-pay | Admitting: Emergency Medicine

## 2012-03-19 ENCOUNTER — Other Ambulatory Visit: Payer: Self-pay

## 2012-03-19 DIAGNOSIS — I509 Heart failure, unspecified: Secondary | ICD-10-CM | POA: Insufficient documentation

## 2012-03-19 DIAGNOSIS — I10 Essential (primary) hypertension: Secondary | ICD-10-CM | POA: Insufficient documentation

## 2012-03-19 DIAGNOSIS — I4891 Unspecified atrial fibrillation: Secondary | ICD-10-CM | POA: Insufficient documentation

## 2012-03-19 DIAGNOSIS — I4892 Unspecified atrial flutter: Secondary | ICD-10-CM | POA: Insufficient documentation

## 2012-03-19 DIAGNOSIS — E785 Hyperlipidemia, unspecified: Secondary | ICD-10-CM | POA: Insufficient documentation

## 2012-03-19 DIAGNOSIS — R0602 Shortness of breath: Secondary | ICD-10-CM | POA: Insufficient documentation

## 2012-03-19 DIAGNOSIS — R1013 Epigastric pain: Secondary | ICD-10-CM | POA: Insufficient documentation

## 2012-03-19 DIAGNOSIS — R109 Unspecified abdominal pain: Secondary | ICD-10-CM

## 2012-03-19 DIAGNOSIS — I5032 Chronic diastolic (congestive) heart failure: Secondary | ICD-10-CM | POA: Insufficient documentation

## 2012-03-19 DIAGNOSIS — E119 Type 2 diabetes mellitus without complications: Secondary | ICD-10-CM | POA: Insufficient documentation

## 2012-03-19 LAB — BASIC METABOLIC PANEL
BUN: 31 mg/dL — ABNORMAL HIGH (ref 6–23)
Calcium: 9.1 mg/dL (ref 8.4–10.5)
GFR calc Af Amer: 55 mL/min — ABNORMAL LOW (ref >90)
GFR calc non Af Amer: 47 mL/min — ABNORMAL LOW (ref >90)
Glucose, Bld: 136 mg/dL — ABNORMAL HIGH (ref 70–99)
Potassium: 4.6 mEq/L (ref 3.5–5.1)
Sodium: 137 mEq/L (ref 135–145)

## 2012-03-19 LAB — HEPATIC FUNCTION PANEL
ALT: 38 U/L — ABNORMAL HIGH (ref 0–35)
AST: 67 U/L — ABNORMAL HIGH (ref 0–37)
Albumin: 3.7 g/dL (ref 3.5–5.2)
Total Protein: 7.5 g/dL (ref 6.0–8.3)

## 2012-03-19 LAB — DIFFERENTIAL
Basophils Relative: 0 % (ref 0–1)
Eosinophils Absolute: 0.3 10*3/uL (ref 0.0–0.7)
Eosinophils Relative: 3 % (ref 0–5)
Lymphs Abs: 2.6 10*3/uL (ref 0.7–4.0)
Neutrophils Relative %: 61 % (ref 43–77)

## 2012-03-19 LAB — CBC
MCH: 27.8 pg (ref 26.0–34.0)
MCHC: 33.4 g/dL (ref 30.0–36.0)
MCV: 83.2 fL (ref 78.0–100.0)
Platelets: 310 10*3/uL (ref 150–400)

## 2012-03-19 LAB — POCT I-STAT TROPONIN I

## 2012-03-19 MED ORDER — GI COCKTAIL ~~LOC~~
30.0000 mL | Freq: Once | ORAL | Status: AC
  Administered 2012-03-19: 30 mL via ORAL

## 2012-03-19 NOTE — ED Notes (Signed)
NO IV AT ARRIVAL.  

## 2012-03-19 NOTE — ED Notes (Signed)
PT reports pain is better after GI cocktail. Pt is resting; no signs of distress.

## 2012-03-19 NOTE — ED Notes (Signed)
Pt ambulated with a steady gait; VSS; A&Ox3; no signs of distress; respirations even and unlabored; no questions at this time.

## 2012-03-19 NOTE — Discharge Instructions (Signed)

## 2012-03-19 NOTE — ED Provider Notes (Signed)
History     CSN: [id number]  Arrival date & time 03/19/12  4098   First MD Initiated Contact with Patient 03/19/12 [phone]      Chief Complaint  Patient presents with  . Abdominal Pain     Patient is a 71 y.o. female presenting with abdominal pain. The history is provided by the patient.  Abdominal Pain The primary symptoms of the illness include abdominal pain and shortness of breath. The primary symptoms of the illness do not include fever, nausea, vomiting or diarrhea. The current episode started yesterday. The onset of the illness was gradual. The problem has not changed since onset. Symptoms associated with the illness do not include chills, diaphoresis or back pain.  worsened by - palpation Improved by - rest  pt reports epigastric pain that started yesterday She reports one brief episode of SOB, none at this time No CP reported No focal weakness No fever, no vomiting No back pain She has not had this previously She denies etoh She denies NSAID use She is on coumadin for afib   Past Medical History  Diagnosis Date  . Atrial fibrillation /flutter   . Hypertension   . Diabetes mellitus   . Hyperlipidemia   . Gout   . 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 Diastolic CHF, chronic     echo 2006 - ef 55-65%; mild diast dysfxn  . Arthritis   . Morbid obesity   . OSA (obstructive sleep apnea)     uses cpap  . CHF (congestive heart failure)   . Ejection fraction     EF 60%, echo, September 19, 2011  . Back pain     Past Surgical History  Procedure Date  . Cholecystectomy   . Total abdominal hysterectomy   . Appendectomy   . Tonsillectomy 1982  . Cardioversion 10/22/2011    Procedure: CARDIOVERSION;  Surgeon: Duke Salvia, MD;  Location: [facility] OR;  Service: Cardiovascular;  Laterality: N/A;    Family History  Problem Relation Age of Onset  . Heart disease Father   . Hypertension Father   . Breast cancer Sister   . Cancer Sister    breast    History  Substance Use Topics  . Smoking status: Never Smoker   . Smokeless tobacco: Never Used  . Alcohol Use: No    OB History    Grav Para Term Preterm Abortions TAB SAB Ect Mult Living                  Review of Systems  Constitutional: Negative for fever, chills and diaphoresis.  Respiratory: Positive for shortness of breath.   Gastrointestinal: Positive for abdominal pain. Negative for nausea, vomiting and diarrhea.  Musculoskeletal: Negative for back pain.  All other systems reviewed and are negative.    Allergies  Review of patient's allergies indicates no known allergies.  Home Medications   Current Outpatient Rx  Name Route Sig Dispense Refill  . AMIODARONE HCL 200 MG PO TABS Oral Take 200 mg by mouth See admin instructions. Five days weekly, Monday -Friday    . AMLODIPINE BESYLATE 5 MG PO TABS  Take one tablet by mouth twice daily. 60 tablet 5  . CLONIDINE HCL 0.2 MG PO TABS Oral Take 1 tablet (0.2 mg total) by mouth 2 (two) times daily. 60 tablet 0  . FUROSEMIDE 40 MG PO TABS Oral Take 1 tablet (40 mg total) by mouth daily. 30 tablet 6  .  LEVOTHYROXINE SODIUM 75 MCG PO TABS Oral Take 1 tablet (75 mcg total) by mouth daily. 30 tablet 6  . LISINOPRIL 40 MG PO TABS Oral Take 40 mg by mouth daily.      Marland Kitchen POTASSIUM CHLORIDE CRYS ER 20 MEQ PO TBCR Oral Take 40-60 mEq by mouth 2 (two) times daily. 40 meq in the morning and 60 meq in the evening    . PRAVASTATIN SODIUM 80 MG PO TABS Oral Take 80 mg by mouth daily.     . WARFARIN SODIUM 5 MG PO TABS Oral Take 2.5-5 mg by mouth daily. Takes 1/2 tablet (2.5 mg) on Monday, Wednesday and Friday. Take 1 tablet (5 mg) on all other days.    Marland Kitchen POTASSIUM CHLORIDE CRYS ER 20 MEQ PO TBCR Oral Take 40 mEq by mouth daily.        BP 147/72  Pulse 71  Temp(Src) 98.3 F (36.8 C) (Oral)  Resp 18  SpO2 96%  Physical Exam CONSTITUTIONAL: Well developed/well nourished HEAD AND FACE: Normocephalic/atraumatic EYES:  EOMI/PERRL ENMT: Mucous membranes moist NECK: supple no meningeal signs SPINE:entire spine nontender CV: S1/S2 noted, no murmurs/rubs/gallops noted LUNGS: Lungs are clear to auscultation bilaterally, no apparent distress ABDOMEN: soft, mild epigastric tenderness, +BS,  no rebound or guarding GU:no cva tenderness NEURO: Pt is awake/alert, moves all extremitiesx4 EXTREMITIES: pulses normal, full ROM SKIN: warm, color normal PSYCH: no abnormalities of mood noted  ED Course  Procedures   Labs Reviewed  CBC  DIFFERENTIAL  BASIC METABOLIC PANEL  PROTIME-INR   8:11 AM Pt well appearing, ambulatory, mild epigastric tenderness Denies association with food No active CP and I doubt ACS given history/exam  Pt improved with GI cocktail Nontoxic in appearance Stable for d/c Did not use troponin in decision making, ordered in triage  The patient appears reasonably screened and/or stabilized for discharge and I doubt any other medical condition or other [facility] requiring further screening, evaluation, or treatment in the ED at this time prior to discharge.    MDM  Nursing notes reviewed and considered in documentation xrays reviewed and considered Previous records reviewed and considered         Date: 03/19/2012  Rate: 67  Rhythm: normal sinus rhythm  QRS Axis: normal  Intervals: normal  ST/T Wave abnormalities: nonspecific ST changes  Conduction Disutrbances:none  Narrative Interpretation:   Old EKG Reviewed: unchanged    Joya Gaskins, MD 03/19/12 [phone]

## 2012-03-19 NOTE — ED Notes (Signed)
PT. REPORTS EPIGASTRIC PAIN WITH SOB ONSET YESTERDAY , DENIES NAUSEA OR VOMITTING , NO DIARRHEA /FEVER OR CHILLS.

## 2012-03-24 ENCOUNTER — Encounter: Payer: Self-pay | Admitting: Physical Medicine & Rehabilitation

## 2012-03-31 ENCOUNTER — Other Ambulatory Visit (HOSPITAL_COMMUNITY): Payer: Self-pay | Admitting: Family Medicine

## 2012-03-31 DIAGNOSIS — Z1231 Encounter for screening mammogram for malignant neoplasm of breast: Secondary | ICD-10-CM

## 2012-04-05 ENCOUNTER — Encounter (HOSPITAL_COMMUNITY): Payer: Self-pay | Admitting: Emergency Medicine

## 2012-04-05 ENCOUNTER — Emergency Department (HOSPITAL_COMMUNITY)
Admission: EM | Admit: 2012-04-05 | Discharge: 2012-04-05 | Disposition: A | Discharge status: Home / Self Care | Payer: Medicare Other | Attending: Emergency Medicine | Admitting: Emergency Medicine

## 2012-04-05 DIAGNOSIS — Z7901 Long term (current) use of anticoagulants: Secondary | ICD-10-CM | POA: Insufficient documentation

## 2012-04-05 DIAGNOSIS — E785 Hyperlipidemia, unspecified: Secondary | ICD-10-CM | POA: Insufficient documentation

## 2012-04-05 DIAGNOSIS — I4891 Unspecified atrial fibrillation: Secondary | ICD-10-CM | POA: Insufficient documentation

## 2012-04-05 DIAGNOSIS — E119 Type 2 diabetes mellitus without complications: Secondary | ICD-10-CM | POA: Insufficient documentation

## 2012-04-05 DIAGNOSIS — N390 Urinary tract infection, site not specified: Secondary | ICD-10-CM

## 2012-04-05 DIAGNOSIS — I4892 Unspecified atrial flutter: Secondary | ICD-10-CM | POA: Insufficient documentation

## 2012-04-05 DIAGNOSIS — G4733 Obstructive sleep apnea (adult) (pediatric): Secondary | ICD-10-CM | POA: Insufficient documentation

## 2012-04-05 DIAGNOSIS — M109 Gout, unspecified: Secondary | ICD-10-CM | POA: Insufficient documentation

## 2012-04-05 DIAGNOSIS — I10 Essential (primary) hypertension: Secondary | ICD-10-CM | POA: Insufficient documentation

## 2012-04-05 DIAGNOSIS — I5032 Chronic diastolic (congestive) heart failure: Secondary | ICD-10-CM | POA: Insufficient documentation

## 2012-04-05 DIAGNOSIS — I509 Heart failure, unspecified: Secondary | ICD-10-CM | POA: Insufficient documentation

## 2012-04-05 LAB — URINALYSIS, ROUTINE W REFLEX MICROSCOPIC
Glucose, UA: NEGATIVE mg/dL
Specific Gravity, Urine: 1.01 (ref 1.005–1.030)

## 2012-04-05 LAB — URINE MICROSCOPIC-ADD ON

## 2012-04-05 LAB — PROTIME-INR
INR: 2.11 — ABNORMAL HIGH (ref 0.00–1.49)
Prothrombin Time: 24 seconds — ABNORMAL HIGH (ref 11.6–15.2)

## 2012-04-05 MED ORDER — SULFAMETHOXAZOLE-TMP DS 800-160 MG PO TABS
1.0000 | ORAL_TABLET | Freq: Once | ORAL | Status: AC
  Administered 2012-04-05: 1 via ORAL
  Filled 2012-04-05: qty 1

## 2012-04-05 MED ORDER — SULFAMETHOXAZOLE-TRIMETHOPRIM 800-160 MG PO TABS: 1.0000 | ORAL_TABLET | Freq: Two times a day (BID) | ORAL | Status: DC

## 2012-04-05 NOTE — ED Notes (Signed)
Patient complaining of burning and pressure during urination; patient also reports small amount of hematuria and urinary frequency.  Patient denies history of UTIs.

## 2012-04-05 NOTE — Discharge Instructions (Signed)
Take the medication exactly as prescribed, return to hospital for severe or worsening symptoms including pain, vomiting and fever.

## 2012-04-05 NOTE — ED Provider Notes (Signed)
History     CSN: [id number]  Arrival date & time 04/05/12  6213   First MD Initiated Contact with Patient 04/05/12 0355      Chief Complaint  Patient presents with  . Urinary Tract Infection    (Consider location/radiation/quality/duration/timing/severity/associated sxs/prior treatment) HPI Comments: Pt is a 71 y/o female who presents with pressure with urination X 12 hours - is gradually getting more intense - has associated hematuria, no n/v/f/c/ or abd pain.  She has urinary frequency.  No hx of UTI's.  No meds pta.  Patient is a 71 y.o. female presenting with urinary tract infection. The history is provided by the patient.  Urinary Tract Infection    Past Medical History  Diagnosis Date  . Atrial fibrillation /flutter   . Hypertension   . Diabetes mellitus   . Hyperlipidemia   . Gout   . 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 Diastolic CHF, chronic     echo 2006 - ef 55-65%; mild diast dysfxn  . Arthritis   . Morbid obesity   . OSA (obstructive sleep apnea)     uses cpap  . CHF (congestive heart failure)   . Ejection fraction     EF 60%, echo, September 19, 2011  . Back pain     Past Surgical History  Procedure Date  . Cholecystectomy   . Total abdominal hysterectomy   . Appendectomy   . Tonsillectomy 1982  . Cardioversion 10/22/2011    Procedure: CARDIOVERSION;  Surgeon: Duke Salvia, MD;  Location: [facility] OR;  Service: Cardiovascular;  Laterality: N/A;    Family History  Problem Relation Age of Onset  . Heart disease Father   . Hypertension Father   . Breast cancer Sister   . Cancer Sister     breast    History  Substance Use Topics  . Smoking status: Never Smoker   . Smokeless tobacco: Never Used  . Alcohol Use: No    OB History    Grav Para Term Preterm Abortions TAB SAB Ect Mult Living                  Review of Systems  Constitutional: Negative for fever and chills.  Gastrointestinal: Negative for nausea and  vomiting.  Genitourinary: Positive for frequency. Negative for flank pain.  Musculoskeletal: Negative for back pain.  Skin: Negative for rash.    Allergies  Review of patient's allergies indicates no known allergies.  Home Medications   Current Outpatient Rx  Name Route Sig Dispense Refill  . AMIODARONE HCL 200 MG PO TABS Oral Take 200 mg by mouth See admin instructions. Five days weekly, Monday -Friday    . AMLODIPINE BESYLATE 5 MG PO TABS  Take one tablet by mouth twice daily. 60 tablet 5  . CLONIDINE HCL 0.2 MG PO TABS Oral Take 1 tablet (0.2 mg total) by mouth 2 (two) times daily. 60 tablet 0  . FUROSEMIDE 40 MG PO TABS Oral Take 1 tablet (40 mg total) by mouth daily. 30 tablet 6  . LEVOTHYROXINE SODIUM 75 MCG PO TABS Oral Take 1 tablet (75 mcg total) by mouth daily. 30 tablet 6  . LISINOPRIL 40 MG PO TABS Oral Take 40 mg by mouth daily.      Marland Kitchen POTASSIUM CHLORIDE CRYS ER 20 MEQ PO TBCR Oral Take 40-60 mEq by mouth 2 (two) times daily. 40 meq in the morning and 60 meq in the evening    .  PRAVASTATIN SODIUM 80 MG PO TABS Oral Take 80 mg by mouth daily.     . WARFARIN SODIUM 5 MG PO TABS Oral Take 2.5-5 mg by mouth daily. Takes 1/2 tablet (2.5 mg) on Monday, Wednesday and Friday. Take 1 tablet (5 mg) on all other days.    Marland Kitchen POTASSIUM CHLORIDE CRYS ER 20 MEQ PO TBCR Oral Take 40 mEq by mouth daily.      . SULFAMETHOXAZOLE-TRIMETHOPRIM 800-160 MG PO TABS Oral Take 1 tablet by mouth every 12 (twelve) hours. 10 tablet 0    BP 145/63  Pulse 90  Temp 97.7 F (36.5 C) (Oral)  Resp 20  SpO2 98%  Physical Exam  Constitutional: She appears well-developed and well-nourished.  HENT:  Head: Normocephalic and atraumatic.  Cardiovascular: Normal rate and regular rhythm.   No murmur heard.      Despite hx of afib, pt appears to have reg rhythm on aucultation  Pulmonary/Chest: Effort normal and breath sounds normal.  Abdominal: Soft. There is no tenderness.       Obese, non tender    Neurological:       Speech, gait and coordination normal    ED Course  Procedures (including critical care time)  Labs Reviewed  URINALYSIS, ROUTINE W REFLEX MICROSCOPIC - Abnormal; Notable for the following:    APPearance CLOUDY (*)     Hgb urine dipstick LARGE (*)     Protein, ur 100 (*)     Leukocytes, UA LARGE (*)     All other components within normal limits  URINE MICROSCOPIC-ADD ON - Abnormal; Notable for the following:    Squamous Epithelial / LPF FEW (*)     Bacteria, UA FEW (*)     All other components within normal limits  PROTIME-INR   No results found.   1. UTI (lower urinary tract infection)       MDM  Well appearing, normal VS - check UA.   UA reviewed by myself - findings concerning for UTI - VS stable - Bacrim given, to f/u as outpt.  Discharge Prescriptions include:  Bactrim  Vida Roller, MD 04/05/12 [phone]

## 2012-04-05 NOTE — ED Notes (Signed)
Pt states pressure when she urinates and painful urination since sat.

## 2012-04-06 ENCOUNTER — Ambulatory Visit: Payer: Medicare Other | Admitting: Physical Medicine & Rehabilitation

## 2012-04-08 ENCOUNTER — Encounter (HOSPITAL_COMMUNITY): Payer: Self-pay | Admitting: Emergency Medicine

## 2012-04-08 DIAGNOSIS — N179 Acute kidney failure, unspecified: Secondary | ICD-10-CM | POA: Diagnosis present

## 2012-04-08 DIAGNOSIS — A419 Sepsis, unspecified organism: Principal | ICD-10-CM | POA: Diagnosis present

## 2012-04-08 DIAGNOSIS — F329 Major depressive disorder, single episode, unspecified: Secondary | ICD-10-CM | POA: Diagnosis present

## 2012-04-08 DIAGNOSIS — I5032 Chronic diastolic (congestive) heart failure: Secondary | ICD-10-CM | POA: Diagnosis present

## 2012-04-08 DIAGNOSIS — E039 Hypothyroidism, unspecified: Secondary | ICD-10-CM | POA: Diagnosis present

## 2012-04-08 DIAGNOSIS — F3289 Other specified depressive episodes: Secondary | ICD-10-CM | POA: Diagnosis present

## 2012-04-08 DIAGNOSIS — E119 Type 2 diabetes mellitus without complications: Secondary | ICD-10-CM | POA: Diagnosis present

## 2012-04-08 DIAGNOSIS — N10 Acute tubulo-interstitial nephritis: Secondary | ICD-10-CM | POA: Diagnosis present

## 2012-04-08 DIAGNOSIS — E871 Hypo-osmolality and hyponatremia: Secondary | ICD-10-CM | POA: Diagnosis present

## 2012-04-08 DIAGNOSIS — A498 Other bacterial infections of unspecified site: Secondary | ICD-10-CM | POA: Diagnosis present

## 2012-04-08 DIAGNOSIS — Z6841 Body Mass Index (BMI) 40.0 and over, adult: Secondary | ICD-10-CM

## 2012-04-08 DIAGNOSIS — G4733 Obstructive sleep apnea (adult) (pediatric): Secondary | ICD-10-CM | POA: Diagnosis present

## 2012-04-08 NOTE — ED Notes (Signed)
PT. REPORTS FEVER ONSET LAST NIGHT , CURRENTLY TAKING ORAL ANTIBIOTIC FOR UTI , DENIES URINARY SYMPTOMS , NO COUGH OR CONGESTION , " I JUST WANT TO BE SURE " .

## 2012-04-09 ENCOUNTER — Inpatient Hospital Stay (HOSPITAL_COMMUNITY)
Admission: EM | Admit: 2012-04-09 | Discharge: 2012-04-13 | DRG: 872 | Disposition: A | Discharge status: Home / Self Care | Payer: Medicare Other | Attending: Internal Medicine | Admitting: Internal Medicine

## 2012-04-09 ENCOUNTER — Encounter (HOSPITAL_COMMUNITY): Payer: Self-pay | Admitting: Internal Medicine

## 2012-04-09 DIAGNOSIS — M109 Gout, unspecified: Secondary | ICD-10-CM

## 2012-04-09 DIAGNOSIS — N289 Disorder of kidney and ureter, unspecified: Secondary | ICD-10-CM

## 2012-04-09 DIAGNOSIS — E785 Hyperlipidemia, unspecified: Secondary | ICD-10-CM

## 2012-04-09 DIAGNOSIS — I4892 Unspecified atrial flutter: Secondary | ICD-10-CM

## 2012-04-09 DIAGNOSIS — I5032 Chronic diastolic (congestive) heart failure: Secondary | ICD-10-CM

## 2012-04-09 DIAGNOSIS — I152 Hypertension secondary to endocrine disorders: Secondary | ICD-10-CM | POA: Diagnosis present

## 2012-04-09 DIAGNOSIS — I10 Essential (primary) hypertension: Secondary | ICD-10-CM

## 2012-04-09 DIAGNOSIS — D3500 Benign neoplasm of unspecified adrenal gland: Secondary | ICD-10-CM

## 2012-04-09 DIAGNOSIS — R001 Bradycardia, unspecified: Secondary | ICD-10-CM

## 2012-04-09 DIAGNOSIS — D72829 Elevated white blood cell count, unspecified: Secondary | ICD-10-CM | POA: Diagnosis present

## 2012-04-09 DIAGNOSIS — G4733 Obstructive sleep apnea (adult) (pediatric): Secondary | ICD-10-CM

## 2012-04-09 DIAGNOSIS — M7660 Achilles tendinitis, unspecified leg: Secondary | ICD-10-CM

## 2012-04-09 DIAGNOSIS — N12 Tubulo-interstitial nephritis, not specified as acute or chronic: Secondary | ICD-10-CM

## 2012-04-09 DIAGNOSIS — Z8669 Personal history of other diseases of the nervous system and sense organs: Secondary | ICD-10-CM

## 2012-04-09 DIAGNOSIS — N10 Acute tubulo-interstitial nephritis: Secondary | ICD-10-CM | POA: Diagnosis present

## 2012-04-09 DIAGNOSIS — E860 Dehydration: Secondary | ICD-10-CM | POA: Diagnosis present

## 2012-04-09 DIAGNOSIS — M549 Dorsalgia, unspecified: Secondary | ICD-10-CM

## 2012-04-09 DIAGNOSIS — E875 Hyperkalemia: Secondary | ICD-10-CM

## 2012-04-09 DIAGNOSIS — I4891 Unspecified atrial fibrillation: Secondary | ICD-10-CM

## 2012-04-09 DIAGNOSIS — N390 Urinary tract infection, site not specified: Secondary | ICD-10-CM

## 2012-04-09 DIAGNOSIS — E119 Type 2 diabetes mellitus without complications: Secondary | ICD-10-CM | POA: Diagnosis present

## 2012-04-09 DIAGNOSIS — E871 Hypo-osmolality and hyponatremia: Secondary | ICD-10-CM | POA: Diagnosis present

## 2012-04-09 DIAGNOSIS — E1159 Type 2 diabetes mellitus with other circulatory complications: Secondary | ICD-10-CM | POA: Diagnosis present

## 2012-04-09 DIAGNOSIS — S39012A Strain of muscle, fascia and tendon of lower back, initial encounter: Secondary | ICD-10-CM

## 2012-04-09 DIAGNOSIS — E039 Hypothyroidism, unspecified: Secondary | ICD-10-CM

## 2012-04-09 DIAGNOSIS — N179 Acute kidney failure, unspecified: Secondary | ICD-10-CM

## 2012-04-09 LAB — URINALYSIS, ROUTINE W REFLEX MICROSCOPIC
Glucose, UA: NEGATIVE mg/dL
Ketones, ur: NEGATIVE mg/dL
Protein, ur: NEGATIVE mg/dL
Urobilinogen, UA: 0.2 mg/dL (ref 0.0–1.0)

## 2012-04-09 LAB — LACTIC ACID, PLASMA: Lactic Acid, Venous: 1.2 mmol/L (ref 0.5–2.2)

## 2012-04-09 LAB — BASIC METABOLIC PANEL
CO2: 24 mEq/L (ref 19–32)
Calcium: 9.2 mg/dL (ref 8.4–10.5)
Creatinine, Ser: 1.76 mg/dL — ABNORMAL HIGH (ref 0.50–1.10)
GFR calc non Af Amer: 29 mL/min — ABNORMAL LOW (ref >90)
Glucose, Bld: 149 mg/dL — ABNORMAL HIGH (ref 70–99)
Sodium: 132 mEq/L — ABNORMAL LOW (ref 135–145)

## 2012-04-09 LAB — URINE MICROSCOPIC-ADD ON

## 2012-04-09 LAB — CBC
HCT: 39.1 % (ref 36.0–46.0)
Hemoglobin: 11.6 g/dL — ABNORMAL LOW (ref 12.0–15.0)
MCH: 27.4 pg (ref 26.0–34.0)
MCH: 27.3 pg (ref 26.0–34.0)
MCHC: 33 g/dL (ref 30.0–36.0)
MCV: 83 fL (ref 78.0–100.0)
MCV: 83.4 fL (ref 78.0–100.0)
Platelets: 302 10*3/uL (ref 150–400)
Platelets: 290 10*3/uL (ref 150–400)
RBC: 4.69 MIL/uL (ref 3.87–5.11)

## 2012-04-09 LAB — PROTIME-INR
INR: 2.44 — ABNORMAL HIGH (ref 0.00–1.49)
Prothrombin Time: 26.9 seconds — ABNORMAL HIGH (ref 11.6–15.2)

## 2012-04-09 LAB — GLUCOSE, CAPILLARY
Glucose-Capillary: 133 mg/dL — ABNORMAL HIGH (ref 70–99)
Glucose-Capillary: 177 mg/dL — ABNORMAL HIGH (ref 70–99)

## 2012-04-09 MED ORDER — MORPHINE SULFATE 2 MG/ML IJ SOLN
2.0000 mg | INTRAMUSCULAR | Status: DC | PRN
  Administered 2012-04-09 – 2012-04-13 (×2): 2 mg via INTRAVENOUS
  Filled 2012-04-09 (×2): qty 1

## 2012-04-09 MED ORDER — DEXTROSE 5 % IV SOLN
1.0000 g | Freq: Once | INTRAVENOUS | Status: AC
  Administered 2012-04-09: 1 g via INTRAVENOUS
  Filled 2012-04-09: qty 10

## 2012-04-09 MED ORDER — SODIUM CHLORIDE 0.9 % IV BOLUS (SEPSIS)
1000.0000 mL | Freq: Once | INTRAVENOUS | Status: AC
  Administered 2012-04-09: 1000 mL via INTRAVENOUS

## 2012-04-09 MED ORDER — ACETAMINOPHEN 325 MG PO TABS
650.0000 mg | ORAL_TABLET | Freq: Four times a day (QID) | ORAL | Status: DC | PRN
  Administered 2012-04-09 (×2): 650 mg via ORAL
  Filled 2012-04-09 (×2): qty 2

## 2012-04-09 MED ORDER — SODIUM CHLORIDE 0.9 % IV SOLN
INTRAVENOUS | Status: DC
  Administered 2012-04-09 – 2012-04-10 (×2): via INTRAVENOUS

## 2012-04-09 MED ORDER — DEXTROSE 5 % IV SOLN
1.0000 g | INTRAVENOUS | Status: DC
  Administered 2012-04-10 – 2012-04-12 (×3): 1 g via INTRAVENOUS
  Filled 2012-04-09 (×3): qty 10

## 2012-04-09 MED ORDER — WARFARIN - PHARMACIST DOSING INPATIENT: Freq: Every day | Status: DC

## 2012-04-09 MED ORDER — LEVOTHYROXINE SODIUM 75 MCG PO TABS: 75.0000 ug | ORAL_TABLET | Freq: Every day | ORAL | Status: DC

## 2012-04-09 MED ORDER — AMIODARONE HCL 200 MG PO TABS
200.0000 mg | ORAL_TABLET | ORAL | Status: DC
  Administered 2012-04-09 – 2012-04-13 (×3): 200 mg via ORAL
  Filled 2012-04-09 (×3): qty 1

## 2012-04-09 MED ORDER — AMLODIPINE BESYLATE 5 MG PO TABS: 5.0000 mg | ORAL_TABLET | Freq: Every day | ORAL | Status: DC

## 2012-04-09 MED ORDER — AMLODIPINE BESYLATE 5 MG PO TABS
5.0000 mg | ORAL_TABLET | Freq: Every day | ORAL | Status: DC
  Administered 2012-04-09 – 2012-04-13 (×5): 5 mg via ORAL
  Filled 2012-04-09 (×5): qty 1

## 2012-04-09 MED ORDER — AMIODARONE HCL 200 MG PO TABS: 200.0000 mg | ORAL_TABLET | ORAL | Status: DC

## 2012-04-09 MED ORDER — DEXTROSE 50 % IV SOLN
50.0000 mL | Freq: Once | INTRAVENOUS | Status: AC
  Administered 2012-04-09: 50 mL via INTRAVENOUS
  Filled 2012-04-09: qty 50

## 2012-04-09 MED ORDER — INSULIN ASPART 100 UNIT/ML ~~LOC~~ SOLN
SUBCUTANEOUS | Status: AC
  Filled 2012-04-09: qty 3

## 2012-04-09 MED ORDER — CLONIDINE HCL 0.2 MG PO TABS
0.2000 mg | ORAL_TABLET | Freq: Two times a day (BID) | ORAL | Status: DC
  Filled 2012-04-09: qty 1

## 2012-04-09 MED ORDER — ONDANSETRON HCL 4 MG PO TABS: 4.0000 mg | ORAL_TABLET | Freq: Four times a day (QID) | ORAL | Status: DC | PRN

## 2012-04-09 MED ORDER — DEXTROSE 5 % IV SOLN
1.0000 g | INTRAVENOUS | Status: DC
  Filled 2012-04-09: qty 10

## 2012-04-09 MED ORDER — POTASSIUM CHLORIDE CRYS ER 20 MEQ PO TBCR: 40.0000 meq | EXTENDED_RELEASE_TABLET | Freq: Two times a day (BID) | ORAL | Status: DC

## 2012-04-09 MED ORDER — ENOXAPARIN SODIUM 40 MG/0.4ML ~~LOC~~ SOLN
40.0000 mg | SUBCUTANEOUS | Status: DC
  Administered 2012-04-09 – 2012-04-10 (×2): 40 mg via SUBCUTANEOUS
  Filled 2012-04-09 (×4): qty 0.4

## 2012-04-09 MED ORDER — ACETAMINOPHEN 650 MG RE SUPP: 650.0000 mg | Freq: Four times a day (QID) | RECTAL | Status: DC | PRN

## 2012-04-09 MED ORDER — DOCUSATE SODIUM 100 MG PO CAPS
100.0000 mg | ORAL_CAPSULE | Freq: Two times a day (BID) | ORAL | Status: DC
  Administered 2012-04-09 – 2012-04-12 (×5): 100 mg via ORAL
  Filled 2012-04-09 (×10): qty 1

## 2012-04-09 MED ORDER — ONDANSETRON HCL 4 MG/2ML IJ SOLN: 4.0000 mg | Freq: Four times a day (QID) | INTRAMUSCULAR | Status: DC | PRN

## 2012-04-09 MED ORDER — CLONIDINE HCL 0.2 MG PO TABS
0.2000 mg | ORAL_TABLET | Freq: Two times a day (BID) | ORAL | Status: DC
  Administered 2012-04-09 – 2012-04-13 (×9): 0.2 mg via ORAL
  Filled 2012-04-09 (×11): qty 1

## 2012-04-09 MED ORDER — LEVOTHYROXINE SODIUM 75 MCG PO TABS
75.0000 ug | ORAL_TABLET | Freq: Every day | ORAL | Status: DC
  Administered 2012-04-10 – 2012-04-12 (×3): 75 ug via ORAL
  Filled 2012-04-09 (×4): qty 1

## 2012-04-09 MED ORDER — SIMVASTATIN 5 MG PO TABS: 5.0000 mg | ORAL_TABLET | Freq: Every day | ORAL | Status: DC

## 2012-04-09 MED ORDER — POTASSIUM CHLORIDE CRYS ER 20 MEQ PO TBCR
40.0000 meq | EXTENDED_RELEASE_TABLET | Freq: Every day | ORAL | Status: DC
Start: 2012-04-09 — End: 2012-04-09

## 2012-04-09 MED ORDER — WARFARIN SODIUM 2.5 MG PO TABS
2.5000 mg | ORAL_TABLET | Freq: Once | ORAL | Status: AC
  Administered 2012-04-09: 2.5 mg via ORAL
  Filled 2012-04-09: qty 1

## 2012-04-09 MED ORDER — INSULIN ASPART 100 UNIT/ML ~~LOC~~ SOLN
10.0000 [IU] | Freq: Once | SUBCUTANEOUS | Status: AC
  Administered 2012-04-09: 10 [IU] via INTRAVENOUS

## 2012-04-09 MED ORDER — SIMVASTATIN 5 MG PO TABS
5.0000 mg | ORAL_TABLET | Freq: Every day | ORAL | Status: DC
  Administered 2012-04-09 – 2012-04-12 (×4): 5 mg via ORAL
  Filled 2012-04-09 (×5): qty 1

## 2012-04-09 NOTE — ED Notes (Signed)
Dr Garba at bedside 

## 2012-04-09 NOTE — H&P (Signed)
[name] is an 71 y.o. female.   Chief Complaint: Fever and flank pain HPI: A 71 year old female with history of diabetes and morbid obesity who apparently started having flank pain and dysuria earlier this week. She was seen by her primary care physician and started on Septra for UTI. She has 4 days of her treatment as she is supposed to have completed it today. She however continues to spike a fever up to 101. She came to the emergency room where she is also found to have a fever with some chills. She also has accompanying hypotension with systolic blood pressure in the upper 80s and lower 90s. Patient denies any diarrhea. She feels she's been constipated since she started having the Septra. Heart and knee she will come here show that she is dehydrated with acute renal insufficiency and leukocytosis. She is being admitted because she failed outpatient therapy.  Past Medical History  Diagnosis Date  . Atrial fibrillation /flutter   . Hypertension   . Diabetes mellitus   . Hyperlipidemia   . Gout   . 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 Diastolic CHF, chronic     echo 2006 - ef 55-65%; mild diast dysfxn  . Arthritis   . Morbid obesity   . OSA (obstructive sleep apnea)     uses cpap  . CHF (congestive heart failure)   . Ejection fraction     EF 60%, echo, September 19, 2011  . Back pain     Past Surgical History  Procedure Date  . Cholecystectomy   . Total abdominal hysterectomy   . Appendectomy   . Tonsillectomy 1982  . Cardioversion 10/22/2011    Procedure: CARDIOVERSION;  Surgeon: Duke Salvia, MD;  Location: [facility] OR;  Service: Cardiovascular;  Laterality: N/A;    Family History  Problem Relation Age of Onset  . Heart disease Father   . Hypertension Father   . Breast cancer Sister   . Cancer Sister     breast   Social History:  reports that she has never smoked. She has never used smokeless tobacco. She reports that she does not drink alcohol  or use illicit drugs.  Allergies: No Known Allergies   (Not in a hospital admission)  Results for orders placed during the hospital encounter of 04/09/12 (from the past 48 hour(s))  CBC     Status: Abnormal   Collection Time   04/09/12  2:02 AM      Component Value Range Comment   WBC 16.3 (*) 4.0 - 10.5 K/uL    RBC 4.23  3.87 - 5.11 MIL/uL    Hemoglobin 11.6 (*) 12.0 - 15.0 g/dL    HCT 40.9 (*) 81.1 - 46.0 %    MCV 83.0  78.0 - 100.0 fL    MCH 27.4  26.0 - 34.0 pg    MCHC 33.0  30.0 - 36.0 g/dL    RDW 91.4  78.2 - 95.6 %    Platelets 302  150 - 400 K/uL   BASIC METABOLIC PANEL     Status: Abnormal   Collection Time   04/09/12  2:02 AM      Component Value Range Comment   Sodium 132 (*) 135 - 145 mEq/L    Potassium 5.4 (*) 3.5 - 5.1 mEq/L    Chloride 97  96 - 112 mEq/L    CO2 24  19 - 32 mEq/L    Glucose, Bld 149 (*) 70 -  99 mg/dL    BUN 39 (*) 6 - 23 mg/dL    Creatinine, Ser 8.41 (*) 0.50 - 1.10 mg/dL    Calcium 9.2  8.4 - 32.4 mg/dL    GFR calc non Af Amer 29 (*) >90 mL/min    GFR calc Af Amer 33 (*) >90 mL/min   URINALYSIS, ROUTINE W REFLEX MICROSCOPIC     Status: Abnormal   Collection Time   04/09/12  2:03 AM      Component Value Range Comment   Color, Urine YELLOW  YELLOW    APPearance CLOUDY (*) CLEAR    Specific Gravity, Urine 1.016  1.005 - 1.030    pH 5.5  5.0 - 8.0    Glucose, UA NEGATIVE  NEGATIVE mg/dL    Hgb urine dipstick TRACE (*) NEGATIVE    Bilirubin Urine SMALL (*) NEGATIVE    Ketones, ur NEGATIVE  NEGATIVE mg/dL    Protein, ur NEGATIVE  NEGATIVE mg/dL    Urobilinogen, UA 0.2  0.0 - 1.0 mg/dL    Nitrite NEGATIVE  NEGATIVE    Leukocytes, UA LARGE (*) NEGATIVE   URINE MICROSCOPIC-ADD ON     Status: Abnormal   Collection Time   04/09/12  2:03 AM      Component Value Range Comment   Squamous Epithelial / LPF FEW (*) RARE    WBC, UA 21-50  <3 WBC/hpf    RBC / HPF 0-2  <3 RBC/hpf    Bacteria, UA MANY (*) RARE   PROTIME-INR     Status: Abnormal    Collection Time   04/09/12  3:46 AM      Component Value Range Comment   Prothrombin Time 26.9 (*) 11.6 - 15.2 seconds    INR 2.44 (*) 0.00 - 1.49   LACTIC ACID, PLASMA     Status: Normal   Collection Time   04/09/12  3:46 AM      Component Value Range Comment   Lactic Acid, Venous 1.2  0.5 - 2.2 mmol/L    No results found.  Review of Systems  Constitutional: Positive for fever and chills.  HENT: Negative.   Eyes: Negative.   Respiratory: Negative.   Cardiovascular: Negative.   Gastrointestinal: Positive for nausea. Negative for vomiting and abdominal pain.  Genitourinary: Positive for dysuria, urgency, frequency and flank pain.  Musculoskeletal: Positive for myalgias.  Skin: Negative.   Neurological: Positive for weakness.  Endo/Heme/Allergies: Negative.   Psychiatric/Behavioral: Negative.     Blood pressure 102/42, pulse 66, temperature 98.2 F (36.8 C), temperature source Oral, resp. rate 18, SpO2 99.00%. Physical Exam  Constitutional: She is oriented to person, place, and time. She appears well-developed and well-nourished.       Obese  HENT:  Head: Normocephalic and atraumatic.  Right Ear: External ear normal.  Left Ear: External ear normal.  Nose: Nose normal.  Mouth/Throat: Oropharynx is clear and moist.  Eyes: Conjunctivae and EOM are normal. Pupils are equal, round, and reactive to light.  Neck: Normal range of motion. Neck supple.  Cardiovascular: Normal rate, regular rhythm, normal heart sounds and intact distal pulses.   Respiratory: Effort normal and breath sounds normal.  GI: Soft. Bowel sounds are normal.  Musculoskeletal: Normal range of motion. She exhibits no edema and no tenderness.  Neurological: She is alert and oriented to person, place, and time. She has normal reflexes.  Skin: Skin is warm and dry.  Psychiatric: She has a normal mood and affect. Her behavior is normal. Judgment  and thought content normal.     Assessment/Plan 71 year old female  here with what appears to be acute pyelonephritis from an outpatient therapy with Septra. She also has a 2 insufficiency with her elevated creatinine more than likely secondary to the Septra. She is also on ACE inhibitor and diuretics which may have contributed to the acute renal failure in the setting. Plan #1 acute pyelonephritis: Patient will be admitted we'll get urine culture and blood cultures x2 are empirically start Rocephin IV until we get any culture results to see if we can narrow down the antibiotic #2 diabetes: Continue home medications with sliding scale insulin. #3 acute renal failure: This is more than likely prerenal in nature with high ACE inhibitor as well as the Septra. We will hold the Septra, hold her Lasix and ACE inhibitor. And we'll hydrate her aggressively and follow renal function. #4 hyponatremia: More than likely due to dehydration we'll hydrate her aggressively. #5 leukocytosis: Secondary to her pyelonephritis. We'll follow her white count closely. #6 Dehydration: We'll hydrate her aggressively as indicated above.  Madisan Bice,LAWAL 04/09/2012, 5:57 AM

## 2012-04-09 NOTE — ED Provider Notes (Signed)
History     CSN: [id number]  Arrival date & time 04/08/12  2248   First MD Initiated Contact with Patient 04/09/12 0202      Chief Complaint  Patient presents with  . Fever    (Consider location/radiation/quality/duration/timing/severity/associated sxs/prior treatment) HPI Pt presents with c/o fever.  She was treated for UTI and finished bactrim last night- had been having pressure and discomfort with urination.  2 nights ago developed fever/chills- temp 101 at home.  Some nausea, but no vomiting/diarrhea or abdominal pain.  States she has been drinking liquids well.  Has continued feel weak and have fever- so came to ED for evaluation.  There are no other associated systemic symptoms, there are no alleviating or modifying factors.   Past Medical History  Diagnosis Date  . Atrial fibrillation /flutter   . Hypertension   . Diabetes mellitus   . Hyperlipidemia   . Gout   . 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 Diastolic CHF, chronic     echo 2006 - ef 55-65%; mild diast dysfxn  . Arthritis   . Morbid obesity   . OSA (obstructive sleep apnea)     uses cpap  . CHF (congestive heart failure)   . Ejection fraction     EF 60%, echo, September 19, 2011  . Back pain     Past Surgical History  Procedure Date  . Cholecystectomy   . Total abdominal hysterectomy   . Appendectomy   . Tonsillectomy 1982  . Cardioversion 10/22/2011    Procedure: CARDIOVERSION;  Surgeon: Duke Salvia, MD;  Location: [facility] OR;  Service: Cardiovascular;  Laterality: N/A;    Family History  Problem Relation Age of Onset  . Heart disease Father   . Hypertension Father   . Breast cancer Sister   . Cancer Sister     breast    History  Substance Use Topics  . Smoking status: Never Smoker   . Smokeless tobacco: Never Used  . Alcohol Use: No    OB History    Grav Para Term Preterm Abortions TAB SAB Ect Mult Living                  Review of Systems ROS reviewed and all  otherwise negative except for mentioned in HPI  Allergies  Review of patient's allergies indicates no known allergies.  Home Medications   Current Outpatient Rx  Name Route Sig Dispense Refill  . AMIODARONE HCL 200 MG PO TABS Oral Take 200 mg by mouth See admin instructions. Five days weekly, Monday -Friday    . AMLODIPINE BESYLATE 5 MG PO TABS  Take one tablet by mouth twice daily. 60 tablet 5  . CLONIDINE HCL 0.2 MG PO TABS Oral Take 1 tablet (0.2 mg total) by mouth 2 (two) times daily. 60 tablet 0  . FUROSEMIDE 40 MG PO TABS Oral Take 1 tablet (40 mg total) by mouth daily. 30 tablet 6  . LEVOTHYROXINE SODIUM 75 MCG PO TABS Oral Take 1 tablet (75 mcg total) by mouth daily. 30 tablet 6  . LISINOPRIL 40 MG PO TABS Oral Take 40 mg by mouth daily.      Marland Kitchen POTASSIUM CHLORIDE CRYS ER 20 MEQ PO TBCR Oral Take 40-60 mEq by mouth 2 (two) times daily. 40 meq in the morning and 60 meq in the evening    . PRAVASTATIN SODIUM 80 MG PO TABS Oral Take 80 mg by mouth daily.     Marland Kitchen  SULFAMETHOXAZOLE-TRIMETHOPRIM 800-160 MG PO TABS Oral Take 1 tablet by mouth every 12 (twelve) hours. 10 tablet 0  . WARFARIN SODIUM 5 MG PO TABS Oral Take 2.5-5 mg by mouth daily. Takes 1/2 tablet (2.5 mg) on Monday, Wednesday and Friday. Take 1 tablet (5 mg) on all other days.    Marland Kitchen POTASSIUM CHLORIDE CRYS ER 20 MEQ PO TBCR Oral Take 40 mEq by mouth daily.        BP 98/57  Pulse 69  Temp 98.2 F (36.8 C) (Oral)  Resp 25  SpO2 97% Vitals reviewed Physical Exam Physical Examination: General appearance - alert, well appearing, and in no distress Mental status - alert, oriented to person, place, and time Eyes - pupils equal and reactive, no scleral icterus or conjunctival injection Mouth - mucous membranes moist, pharynx normal without lesions Chest - clear to auscultation, no wheezes, rales or rhonchi, symmetric air entry Heart - normal rate, regular rhythm, normal S1, S2, no murmurs, rubs, clicks or gallops Abdomen -  soft, nontender, nondistended, no masses or organomegaly, nabs Back- no CVA tenderness Extremities - peripheral pulses normal, no pedal edema, no clubbing or cyanosis Skin - normal coloration and turgor, no rashes, brisk cap refill  ED Course  Procedures (including critical care time)  5:16 AM discussed with Dr. Mikeal Hawthorne, triad hospitalist for admission.  Pt to go to team 5.     Date: 04/09/2012  Rate: 74  Rhythm: normal sinus rhythm  QRS Axis: normal  Intervals: normal  ST/T Wave abnormalities: normal  Conduction Disutrbances:none  Narrative Interpretation: low voltage QRS  Old EKG Reviewed: none available    Labs Reviewed  CBC - Abnormal; Notable for the following:    WBC 16.3 (*)     Hemoglobin 11.6 (*)     HCT 35.1 (*)     All other components within normal limits  BASIC METABOLIC PANEL - Abnormal; Notable for the following:    Sodium 132 (*)     Potassium 5.4 (*)     Glucose, Bld 149 (*)     BUN 39 (*)     Creatinine, Ser 1.76 (*)     GFR calc non Af Amer 29 (*)     GFR calc Af Amer 33 (*)     All other components within normal limits  URINALYSIS, ROUTINE W REFLEX MICROSCOPIC - Abnormal; Notable for the following:    APPearance CLOUDY (*)     Hgb urine dipstick TRACE (*)     Bilirubin Urine SMALL (*)     Leukocytes, UA LARGE (*)     All other components within normal limits  URINE MICROSCOPIC-ADD ON - Abnormal; Notable for the following:    Squamous Epithelial / LPF FEW (*)     Bacteria, UA MANY (*)     All other components within normal limits  PROTIME-INR - Abnormal; Notable for the following:    Prothrombin Time 26.9 (*)     INR 2.44 (*)     All other components within normal limits  LACTIC ACID, PLASMA  CULTURE, BLOOD (ROUTINE X 2)  CULTURE, BLOOD (ROUTINE X 2)   No results found.   1. Renal insufficiency   2. Urinary tract infection   3. ATRIAL FIBRILLATION, PAROXYSMAL   4. Sinus bradycardia   5. HYPERTENSION   6. Chronic diastolic heart failure     7. Atrial fibrillation   8. Essential hypertension, benign   9. Hypothyroidism       MDM  Pt presenting  with fever- finished course of bactrim for UTI.  Labs reveal continued UTI, renal insufficiency with mild hyperkalemia, also leukocytosis.  Pt tretaed with IV hydration, insulin/glucose- started on rocephin for UTI- blood cultures obtained, lactate normal.  Pt admitted to triad for further evaluation        Ethelda Chick, MD 04/09/12 [phone]

## 2012-04-09 NOTE — ED Notes (Signed)
Pt reports being diagnosed with a UTI on 6/17 and starting antibiotics. Pt finishes the antibiotics today but she started running a fever 2 days ago. Pt denies any other symptoms.

## 2012-04-09 NOTE — ED Notes (Signed)
Lab at bedside

## 2012-04-09 NOTE — Progress Notes (Signed)
I have seen and examined the pt at bedside. Please note that pt has been seen earlier in the day by Dr. Mikeal Hawthorne and pt has been admitted for treatment of ACUTE PYELONEPHRITIS. Pt was started on appropriate antibiotic, creatinine is continuing to trend down but leukocytosis is persistent.   PLAN - continue antibiotic - obtain BMP and CBC in AM - continue hydration - due to hyperkalemia will home home medication potassium  Debbora Presto, MD  [facility] Pager [phone]  If 7PM-7AM, please contact night-coverage [email or web address] Password [email or web address]

## 2012-04-09 NOTE — Progress Notes (Signed)
ANTICOAGULATION CONSULT NOTE - Initial Consult  Pharmacy Consult for coumadin Indication: atrial fibrillation/flutter  No Known Allergies  Patient Measurements:   Vital Signs: Temp: 99.5 F (37.5 C) (06/21 1316) Temp src: Oral (06/21 1316) BP: 106/47 mmHg (06/21 1316) Pulse Rate: 74  (06/21 1316)  Labs:  Basename 04/09/12 0920 04/09/12 0346 04/09/12 0202  HGB 12.8 -- 11.6*  HCT 39.1 -- 35.1*  PLT 290 -- 302  APTT -- -- --  LABPROT -- 26.9* --  INR -- 2.44* --  HEPARINUNFRC -- -- --  CREATININE 1.27* -- 1.76*  CKTOTAL -- -- --  CKMB -- -- --  TROPONINI -- -- --    The CrCl is unknown because both a height and weight (above a minimum accepted value) are required for this calculation.   Medical History: Past Medical History  Diagnosis Date  . Atrial fibrillation /flutter   . Hypertension   . Diabetes mellitus   . Hyperlipidemia   . Gout   . 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 Diastolic CHF, chronic     echo 2006 - ef 55-65%; mild diast dysfxn  . Arthritis   . Morbid obesity   . OSA (obstructive sleep apnea)     uses cpap  . CHF (congestive heart failure)   . Ejection fraction     EF 60%, echo, September 19, 2011  . Back pain     Medications:  Prescriptions prior to admission  Medication Sig Dispense Refill  . amiodarone (PACERONE) 200 MG tablet Take 200 mg by mouth See admin instructions. Five days weekly, Monday -Friday      . amLODipine (NORVASC) 5 MG tablet Take one tablet by mouth twice daily.  60 tablet  5  . cloNIDine (CATAPRES) 0.2 MG tablet Take 1 tablet (0.2 mg total) by mouth 2 (two) times daily.  60 tablet  0  . furosemide (LASIX) 40 MG tablet Take 1 tablet (40 mg total) by mouth daily.  30 tablet  6  . levothyroxine (SYNTHROID, LEVOTHROID) 75 MCG tablet Take 1 tablet (75 mcg total) by mouth daily.  30 tablet  6  . lisinopril (PRINIVIL,ZESTRIL) 40 MG tablet Take 40 mg by mouth daily.        . potassium chloride SA  (K-DUR,KLOR-CON) 20 MEQ tablet Take 40-60 mEq by mouth 2 (two) times daily. 40 meq in the morning and 60 meq in the evening      . pravastatin (PRAVACHOL) 80 MG tablet Take 80 mg by mouth daily.       Marland Kitchen sulfamethoxazole-trimethoprim (SEPTRA DS) 800-160 MG per tablet Take 1 tablet by mouth every 12 (twelve) hours.  10 tablet  0  . warfarin (COUMADIN) 5 MG tablet Take 2.5-5 mg by mouth daily. Takes 1/2 tablet (2.5 mg) on Monday, Wednesday and Friday. Take 1 tablet (5 mg) on all other days.      . potassium chloride SA (K-DUR,KLOR-CON) 20 MEQ tablet Take 40 mEq by mouth daily.          Assessment: 71 yo F admitted with acute pyelonephritis on coumadin at home for  Afib/flutter. Home coumadin dose 2.5 MWF and 5mg  other days. S/p 10 days bactrim for UTI. Now admitted for IV rocephin for pyelonephritis. INR 2.44 after 10 days of Bactrim.  This is very surprising as Bactrim usually causes and increase in INR.  No bleeding reported.  Goal of Therapy:  INR 2-3   Plan:  Coumadin 2.5 mg po  x 1 dose today Daily INR. Herby Abraham, Pharm.D. [phone] 04/09/2012 3:32 PM

## 2012-04-10 ENCOUNTER — Inpatient Hospital Stay (HOSPITAL_COMMUNITY): Payer: Medicare Other

## 2012-04-10 DIAGNOSIS — A413 Sepsis due to Hemophilus influenzae: Secondary | ICD-10-CM

## 2012-04-10 DIAGNOSIS — I10 Essential (primary) hypertension: Secondary | ICD-10-CM

## 2012-04-10 DIAGNOSIS — N179 Acute kidney failure, unspecified: Secondary | ICD-10-CM

## 2012-04-10 DIAGNOSIS — N12 Tubulo-interstitial nephritis, not specified as acute or chronic: Secondary | ICD-10-CM

## 2012-04-10 LAB — GLUCOSE, CAPILLARY
Glucose-Capillary: 168 mg/dL — ABNORMAL HIGH (ref 70–99)
Glucose-Capillary: 112 mg/dL — ABNORMAL HIGH (ref 70–99)
Glucose-Capillary: 149 mg/dL — ABNORMAL HIGH (ref 70–99)

## 2012-04-10 LAB — CBC
HCT: 32.6 % — ABNORMAL LOW (ref 36.0–46.0)
Hemoglobin: 10.8 g/dL — ABNORMAL LOW (ref 12.0–15.0)
MCH: 27.3 pg (ref 26.0–34.0)
MCHC: 33.1 g/dL (ref 30.0–36.0)
RBC: 3.96 MIL/uL (ref 3.87–5.11)

## 2012-04-10 LAB — COMPREHENSIVE METABOLIC PANEL
ALT: 10 U/L (ref 0–35)
AST: 11 U/L (ref 0–37)
Albumin: 2.9 g/dL — ABNORMAL LOW (ref 3.5–5.2)
Calcium: 8.8 mg/dL (ref 8.4–10.5)
Creatinine, Ser: 0.92 mg/dL (ref 0.50–1.10)
GFR calc non Af Amer: 63 mL/min — ABNORMAL LOW (ref >90)
Sodium: 136 mEq/L (ref 135–145)
Total Protein: 7.1 g/dL (ref 6.0–8.3)

## 2012-04-10 MED ORDER — WARFARIN SODIUM 5 MG PO TABS
5.0000 mg | ORAL_TABLET | Freq: Once | ORAL | Status: AC
  Administered 2012-04-10: 5 mg via ORAL
  Filled 2012-04-10: qty 1

## 2012-04-10 MED ORDER — IOHEXOL 300 MG/ML  SOLN
100.0000 mL | Freq: Once | INTRAMUSCULAR | Status: AC | PRN
  Administered 2012-04-10: 100 mL via INTRAVENOUS

## 2012-04-10 MED ORDER — INSULIN ASPART 100 UNIT/ML ~~LOC~~ SOLN
0.0000 [IU] | Freq: Three times a day (TID) | SUBCUTANEOUS | Status: DC
  Administered 2012-04-10: 1 [IU] via SUBCUTANEOUS
  Administered 2012-04-11: 2 [IU] via SUBCUTANEOUS

## 2012-04-10 MED ORDER — IOHEXOL 300 MG/ML  SOLN
20.0000 mL | INTRAMUSCULAR | Status: AC
  Administered 2012-04-10: 20 mL via ORAL

## 2012-04-10 MED ORDER — INSULIN ASPART 100 UNIT/ML ~~LOC~~ SOLN: 0.0000 [IU] | Freq: Every day | SUBCUTANEOUS | Status: DC

## 2012-04-10 NOTE — Progress Notes (Signed)
Patient ID: [name]  female  ZOX:[id number]    DOB: [birth year]    DOA: 04/09/2012  PCP: Norberto Sorenson, MD  Subjective: Still continues to complain of right flank pain  Objective: Weight change:   Intake/Output Summary (Last 24 hours) at 04/10/12 1223 Last data filed at 04/10/12 0900  Gross per 24 hour  Intake   1865 ml  Output    400 ml  Net   1465 ml   Blood pressure 116/68, pulse 79, temperature 98.5 F (36.9 C), temperature source Oral, resp. rate 20, weight 107.4 kg (236 lb 12.4 oz), SpO2 96.00%.  Physical Exam: General: Alert and awake, oriented x3, not in any acute distress. HEENT: anicteric sclera, pupils reactive to light and accommodation, EOMI CVS: S1-S2 clear, no murmur rubs or gallops Chest: clear to auscultation bilaterally, no wheezing, rales or rhonchi Abdomen: soft nontender, nondistended, normal bowel sounds, no organomegaly Extremities: no cyanosis, clubbing or edema noted bilaterally Neuro: Cranial nerves II-XII intact, no focal neurological deficits  Lab Results: Basic Metabolic Panel:  Lab 04/10/12 8119 04/09/12 0920 04/09/12 0202  NA 136 -- 132*  K 4.6 -- 5.4*  CL 104 -- 97  CO2 19 -- 24  GLUCOSE 125* -- 149*  BUN 19 -- 39*  CREATININE 0.92 1.27* --  CALCIUM 8.8 -- 9.2  MG -- -- --  PHOS -- -- --   Liver Function Tests:  Lab 04/10/12 0630  AST 11  ALT 10  ALKPHOS 90  BILITOT 0.3  PROT 7.1  ALBUMIN 2.9*   CBC:  Lab 04/10/12 0630 04/09/12 0920  WBC 14.1* 18.2*  NEUTROABS -- --  HGB 10.8* 12.8  HCT 32.6* 39.1  MCV 82.3 83.4  PLT 289 290     Lab 04/10/12 0735 04/09/12 1144 04/09/12 0342  GLUCAP 168* 177* 133*     Micro Results: Recent Results (from the past 240 hour(s))  CULTURE, BLOOD (ROUTINE X 2)     Status: Normal (Preliminary result)   Collection Time   04/09/12  3:30 AM      Component Value Range Status Comment   Specimen Description BLOOD LEFT ARM   Final    Special Requests BOTTLES DRAWN AEROBIC AND ANAEROBIC [facility]   Final    Culture  Setup Time [id number]   Final    Culture     Final    Value: GRAM NEGATIVE RODS     Note: Gram Stain Report Called to,Read Back By and Verified With: MANUEL CASTRO @ 2344 ON 04/09/2012 HAJAM   Report Status PENDING   Incomplete   CULTURE, BLOOD (ROUTINE X 2)     Status: Normal (Preliminary result)   Collection Time   04/09/12  3:35 AM      Component Value Range Status Comment   Specimen Description BLOOD LEFT ARM   Final    Special Requests BOTTLES DRAWN AEROBIC AND ANAEROBIC [facility]   Final    Culture  Setup Time [id number]   Final    Culture     Final    Value: GRAM NEGATIVE RODS     Note: Gram Stain Report Called to,Read Back By and Verified With: MANUEL CASTRO @ 2344 ON 04/09/2012 HAJAM   Report Status PENDING   Incomplete     Studies/Results: Dg Chest 2 View  03/19/2012  *RADIOLOGY REPORT*  Clinical Data: Epigastric pain, chest pain, and shortness of breath.  CHEST - 2 VIEW  Comparison: 09/29/2011  Findings: Shallow inspiration.  Cardiac enlargement with normal  pulmonary vascularity.  Slight fibrosis in the left lung base.  No focal airspace consolidation.  No blunting of costophrenic angles. No pneumothorax.  Degenerative changes in the spine.  Calcification of the aorta.  No significant change since previous study.  IMPRESSION: Cardiac enlargement.  No evidence of active pulmonary disease.  Original Report Authenticated By: Marlon Pel, M.D.    Medications: Scheduled Meds:   . amiodarone  200 mg Oral Custom  . amLODipine  5 mg Oral Daily  . cefTRIAXone (ROCEPHIN)  IV  1 g Intravenous Q24H  . cloNIDine  0.2 mg Oral BID  . docusate sodium  100 mg Oral BID  . enoxaparin  40 mg Subcutaneous Q24H  . insulin aspart      . iohexol  20 mL Oral Q1 Hr x 2  . levothyroxine  75 mcg Oral Daily  . simvastatin  5 mg Oral q1800  . warfarin  2.5 mg Oral ONCE-1800  . Warfarin - Pharmacist Dosing Inpatient   Does not apply q1800   Continuous Infusions:     . sodium chloride 100 mL/hr at 04/10/12 0534     Assessment/Plan: Principal Problem:  *Acute pyelonephritis with gram-negative rod bacteremia/sepsis - Follow final sensitivities of the blood culture likely coming from UTI, urine culture pending - Continue Rocephin IV, obtain CT of the abdomen and pelvis to rule out any renal abscess  Active Problems:  DM - Place on sliding scale insulin, obtain hemoglobin A1c   OBESITY, MORBID: Consultation on weight control and diet control   HYPERTENSION: BP stable   ARF (acute renal failure): Improved   Hyponatremia: Improved   Dehydration: Improved    Leukocytosis: Likely secondary to acute pyelonephritis, improving  History of atrial flutter: s/p DCCV 07/31/2011, on amiodarone and Coumadin  DVT Prophylaxis:On Coumadin  Code Status:Full code  Disposition:hopefully in next 48-72 hours when culture sensitivities are available   LOS: 1 day   Elynore Dolinski M.D. [facility] 04/10/2012, 12:23 PM Pager: [phone]  If 7PM-7AM, please contact night-coverage [email or web address] Password [email or web address]

## 2012-04-10 NOTE — Progress Notes (Signed)
NP made aware of BC. No new orders.

## 2012-04-10 NOTE — Progress Notes (Signed)
ANTICOAGULATION CONSULT NOTE - Initial Consult  Pharmacy Consult for coumadin Indication: atrial fibrillation/flutter  No Known Allergies  Patient Measurements:   Vital Signs: Temp: 98.5 F (36.9 C) (06/22 0730) Temp src: Oral (06/22 0730) BP: 116/68 mmHg (06/22 0730) Pulse Rate: 79  (06/22 0730)  Labs:  Alvira Philips 04/10/12 0923 04/10/12 0630 04/09/12 0920 04/09/12 0346 04/09/12 0202  HGB -- 10.8* 12.8 -- --  HCT -- 32.6* 39.1 -- 35.1*  PLT -- 289 290 -- 302  APTT -- -- -- -- --  LABPROT 28.0* -- -- 26.9* --  INR 2.57* -- -- 2.44* --  HEPARINUNFRC -- -- -- -- --  CREATININE -- 0.92 1.27* -- 1.76*  CKTOTAL -- -- -- -- --  CKMB -- -- -- -- --  TROPONINI -- -- -- -- --    The CrCl is unknown because both a height and weight (above a minimum accepted value) are required for this calculation.   Medical History: Past Medical History  Diagnosis Date  . Atrial fibrillation /flutter   . Hypertension   . Diabetes mellitus   . Hyperlipidemia   . Gout   . 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 Diastolic CHF, chronic     echo 2006 - ef 55-65%; mild diast dysfxn  . Arthritis   . Morbid obesity   . OSA (obstructive sleep apnea)     uses cpap  . CHF (congestive heart failure)   . Ejection fraction     EF 60%, echo, September 19, 2011  . Back pain     Medications:  Prescriptions prior to admission  Medication Sig Dispense Refill  . amiodarone (PACERONE) 200 MG tablet Take 200 mg by mouth See admin instructions. Five days weekly, Monday -Friday      . amLODipine (NORVASC) 5 MG tablet Take one tablet by mouth twice daily.  60 tablet  5  . cloNIDine (CATAPRES) 0.2 MG tablet Take 1 tablet (0.2 mg total) by mouth 2 (two) times daily.  60 tablet  0  . furosemide (LASIX) 40 MG tablet Take 1 tablet (40 mg total) by mouth daily.  30 tablet  6  . levothyroxine (SYNTHROID, LEVOTHROID) 75 MCG tablet Take 1 tablet (75 mcg total) by mouth daily.  30 tablet  6  .  lisinopril (PRINIVIL,ZESTRIL) 40 MG tablet Take 40 mg by mouth daily.        . potassium chloride SA (K-DUR,KLOR-CON) 20 MEQ tablet Take 40-60 mEq by mouth 2 (two) times daily. 40 meq in the morning and 60 meq in the evening      . pravastatin (PRAVACHOL) 80 MG tablet Take 80 mg by mouth daily.       Marland Kitchen sulfamethoxazole-trimethoprim (SEPTRA DS) 800-160 MG per tablet Take 1 tablet by mouth every 12 (twelve) hours.  10 tablet  0  . warfarin (COUMADIN) 5 MG tablet Take 2.5-5 mg by mouth daily. Takes 1/2 tablet (2.5 mg) on Monday, Wednesday and Friday. Take 1 tablet (5 mg) on all other days.      . potassium chloride SA (K-DUR,KLOR-CON) 20 MEQ tablet Take 40 mEq by mouth daily.          Assessment: 71 yo F admitted with acute pyelonephritis on coumadin at home for  Afib/flutter. Home coumadin dose 2.5 MWF and 5mg  other days. S/p 10 days bactrim for UTI. Now admitted for IV rocephin for pyelonephritis. INR 2.57 remains therapeutic, will continue dosing per home regimen. Drop in hgb  likely dilutional.  No bleeding noted per RN.  Goal of Therapy:  INR 2-3   Plan:  Coumadin 5 mg po x 1 dose today F/u daily INR in AM  Maudry Mayhew, PharmD Pgr [phone] 04/10/2012 12:24 PM

## 2012-04-11 DIAGNOSIS — I10 Essential (primary) hypertension: Secondary | ICD-10-CM

## 2012-04-11 DIAGNOSIS — N179 Acute kidney failure, unspecified: Secondary | ICD-10-CM

## 2012-04-11 DIAGNOSIS — A413 Sepsis due to Hemophilus influenzae: Secondary | ICD-10-CM

## 2012-04-11 DIAGNOSIS — N12 Tubulo-interstitial nephritis, not specified as acute or chronic: Secondary | ICD-10-CM

## 2012-04-11 LAB — GLUCOSE, CAPILLARY
Glucose-Capillary: 133 mg/dL — ABNORMAL HIGH (ref 70–99)
Glucose-Capillary: 120 mg/dL — ABNORMAL HIGH (ref 70–99)

## 2012-04-11 LAB — HEMOGLOBIN A1C
Hgb A1c MFr Bld: 8.2 % — ABNORMAL HIGH (ref <5.7)
Mean Plasma Glucose: 189 mg/dL — ABNORMAL HIGH (ref <117)

## 2012-04-11 LAB — URINE CULTURE
Colony Count: 15000
Culture  Setup Time: 201306211605

## 2012-04-11 MED ORDER — WARFARIN SODIUM 5 MG PO TABS
5.0000 mg | ORAL_TABLET | Freq: Once | ORAL | Status: AC
  Administered 2012-04-11: 5 mg via ORAL
  Filled 2012-04-11: qty 1

## 2012-04-11 MED ORDER — INSULIN ASPART 100 UNIT/ML ~~LOC~~ SOLN
0.0000 [IU] | Freq: Three times a day (TID) | SUBCUTANEOUS | Status: DC
  Administered 2012-04-12 – 2012-04-13 (×2): 2 [IU] via SUBCUTANEOUS

## 2012-04-11 NOTE — Progress Notes (Signed)
ANTICOAGULATION CONSULT NOTE - Initial Consult  Pharmacy Consult for coumadin Indication: atrial fibrillation/flutter  No Known Allergies  Patient Measurements:   Vital Signs: Temp: 98.5 F (36.9 C) (06/23 0935) Temp src: Oral (06/23 0935) BP: 110/46 mmHg (06/23 0935) Pulse Rate: 62  (06/23 0935)  Labs:  [name] 04/11/12 0645 04/10/12 2130 04/10/12 0630 04/09/12 0920 04/09/12 0346 04/09/12 0202  HGB -- -- 10.8* 12.8 -- --  HCT -- -- 32.6* 39.1 -- 35.1*  PLT -- -- 289 290 -- 302  APTT -- -- -- -- -- --  LABPROT 25.2* 28.0* -- -- 26.9* --  INR 2.24* 2.57* -- -- 2.44* --  HEPARINUNFRC -- -- -- -- -- --  CREATININE -- -- 0.92 1.27* -- 1.76*  CKTOTAL -- -- -- -- -- --  CKMB -- -- -- -- -- --  TROPONINI -- -- -- -- -- --    Estimated Creatinine Clearance: 69.4 ml/min (by C-G formula based on Cr of 0.92).   Medical History: Past Medical History  Diagnosis Date  . Atrial fibrillation /flutter   . Hypertension   . Diabetes mellitus   . Hyperlipidemia   . Gout   . 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 Diastolic CHF, chronic     echo 2006 - ef 55-65%; mild diast dysfxn  . Arthritis   . Morbid obesity   . OSA (obstructive sleep apnea)     uses cpap  . CHF (congestive heart failure)   . Ejection fraction     EF 60%, echo, September 19, 2011  . Back pain     Medications:  Prescriptions prior to admission  Medication Sig Dispense Refill  . amiodarone (PACERONE) 200 MG tablet Take 200 mg by mouth See admin instructions. Five days weekly, Monday -Friday      . amLODipine (NORVASC) 5 MG tablet Take one tablet by mouth twice daily.  60 tablet  5  . cloNIDine (CATAPRES) 0.2 MG tablet Take 1 tablet (0.2 mg total) by mouth 2 (two) times daily.  60 tablet  0  . furosemide (LASIX) 40 MG tablet Take 1 tablet (40 mg total) by mouth daily.  30 tablet  6  . levothyroxine (SYNTHROID, LEVOTHROID) 75 MCG tablet Take 1 tablet (75 mcg total) by mouth daily.  30  tablet  6  . lisinopril (PRINIVIL,ZESTRIL) 40 MG tablet Take 40 mg by mouth daily.        . potassium chloride SA (K-DUR,KLOR-CON) 20 MEQ tablet Take 40-60 mEq by mouth 2 (two) times daily. 40 meq in the morning and 60 meq in the evening      . pravastatin (PRAVACHOL) 80 MG tablet Take 80 mg by mouth daily.       [name] sulfamethoxazole-trimethoprim (SEPTRA DS) 800-160 MG per tablet Take 1 tablet by mouth every 12 (twelve) hours.  10 tablet  0  . warfarin (COUMADIN) 5 MG tablet Take 2.5-5 mg by mouth daily. Takes 1/2 tablet (2.5 mg) on Monday, Wednesday and Friday. Take 1 tablet (5 mg) on all other days.      . potassium chloride SA (K-DUR,KLOR-CON) 20 MEQ tablet Take 40 mEq by mouth daily.          Assessment: 71 yo F admitted with acute pyelonephritis on coumadin at home for  Afib/flutter. Home coumadin dose 2.5 MWF and 5mg  other days. S/p 10 days bactrim for UTI. Now admitted for IV rocephin for pyelonephritis. INR 2.24 remains therapeutic, will continue dosing  per home regimen. No bleeding noted per RN.  Goal of Therapy:  INR 2-3   Plan:  Coumadin 5 mg po x 1 dose today F/u daily INR in AM  Maudry Mayhew, PharmD Pgr [phone] 04/11/2012 10:46 AM

## 2012-04-11 NOTE — Progress Notes (Signed)
Patient ID: [name]  female  ERD:[id number]    DOB: 1941/08/07    DOA: 04/09/2012  PCP: Norberto Sorenson, MD  Subjective: Still continues to complain of right flank pain  Objective: Weight change: 8.585 kg (18 lb 14.8 oz)  Intake/Output Summary (Last 24 hours) at 04/11/12 1104 Last data filed at 04/11/12 0900  Gross per 24 hour  Intake   2875 ml  Output      0 ml  Net   2875 ml   Blood pressure 150/75, pulse 91, temperature 98.5 F (36.9 C), temperature source Oral, resp. rate 20, height 5\' 1"  (1.549 m), weight 115.985 kg (255 lb 11.2 oz), SpO2 96.00%.  Physical Exam: General: Alert and awake, oriented x3, not in any acute distress. HEENT: anicteric sclera, pupils reactive to light and accommodation, EOMI CVS: S1-S2 clear, no murmur rubs or gallops Chest: clear to auscultation bilaterally, no wheezing, rales or rhonchi Abdomen: soft nontender, nondistended, normal bowel sounds, no organomegaly Extremities: no cyanosis, clubbing or edema noted bilaterally Neuro: Cranial nerves II-XII intact, no focal neurological deficits  Lab Results: Basic Metabolic Panel:  Lab 04/10/12 5631 04/09/12 0920 04/09/12 0202  NA 136 -- 132*  K 4.6 -- 5.4*  CL 104 -- 97  CO2 19 -- 24  GLUCOSE 125* -- 149*  BUN 19 -- 39*  CREATININE 0.92 1.27* --  CALCIUM 8.8 -- 9.2  MG -- -- --  PHOS -- -- --   Liver Function Tests:  Lab 04/10/12 0630  AST 11  ALT 10  ALKPHOS 90  BILITOT 0.3  PROT 7.1  ALBUMIN 2.9*   CBC:  Lab 04/10/12 0630 04/09/12 0920  WBC 14.1* 18.2*  NEUTROABS -- --  HGB 10.8* 12.8  HCT 32.6* 39.1  MCV 82.3 83.4  PLT 289 290     Lab 04/11/12 0747 04/10/12 2136 04/10/12 1619 04/10/12 1420 04/10/12 0735  GLUCAP 133* 158* 149* 112* 168*     Micro Results: Recent Results (from the past 240 hour(s))  CULTURE, BLOOD (ROUTINE X 2)     Status: Normal (Preliminary result)   Collection Time   04/09/12  3:30 AM      Component Value Range Status Comment   Specimen  Description BLOOD LEFT ARM   Final    Special Requests BOTTLES DRAWN AEROBIC AND ANAEROBIC [facility]   Final    Culture  Setup Time [id number]   Final    Culture     Final    Value: GRAM NEGATIVE RODS     Note: Gram Stain Report Called to,Read Back By and Verified With: MANUEL CASTRO @ 2344 ON 04/09/2012 HAJAM   Report Status PENDING   Incomplete   CULTURE, BLOOD (ROUTINE X 2)     Status: Normal (Preliminary result)   Collection Time   04/09/12  3:35 AM      Component Value Range Status Comment   Specimen Description BLOOD LEFT ARM   Final    Special Requests BOTTLES DRAWN AEROBIC AND ANAEROBIC [facility]   Final    Culture  Setup Time [id number]   Final    Culture     Final    Value: GRAM NEGATIVE RODS     Note: Gram Stain Report Called to,Read Back By and Verified With: MANUEL CASTRO @ 2344 ON 04/09/2012 HAJAM   Report Status PENDING   Incomplete     Studies/Results:  Dg Chest 2 View 03/19/2012  * IMPRESSION: Cardiac enlargement.  No evidence of active pulmonary disease.  Original Report Authenticated By: Marlon Pel, M.D.   CT abdomen and pelvis on 04/10/2012 IMPRESSION:  Mid - lower left renal heterogeneity with adjacent inflammation compatible with pyelonephritis. No evidence of renal or abdominal/pelvic abscess.  Cardiomegaly.  Stable right adrenal adenoma   Medications: Scheduled Meds:    . amiodarone  200 mg Oral Custom  . amLODipine  5 mg Oral Daily  . cefTRIAXone (ROCEPHIN)  IV  1 g Intravenous Q24H  . cloNIDine  0.2 mg Oral BID  . docusate sodium  100 mg Oral BID  . enoxaparin  40 mg Subcutaneous Q24H  . insulin aspart  0-5 Units Subcutaneous QHS  . insulin aspart  0-9 Units Subcutaneous TID WC  . iohexol  20 mL Oral Q1 Hr x 2  . levothyroxine  75 mcg Oral Daily  . simvastatin  5 mg Oral q1800  . warfarin  5 mg Oral ONCE-1800  . warfarin  5 mg Oral ONCE-1800  . Warfarin - Pharmacist Dosing Inpatient   Does not apply q1800   Continuous Infusions:      . sodium chloride 100 mL/hr at 04/10/12 0534     Assessment/Plan: Principal Problem:  *Acute left pyelonephritis with gram-negative rod bacteremia/sepsis: No renal abscess - Follow final sensitivities of the blood culture, likely coming from UTI, urine culture pending, sensitivities pending - Continue Rocephin IV  Active Problems:  DM: Poorly controlled, hemoglobin A1c 8.2 - Placed on moderate sliding scale insulin   OBESITY, MORBID: Consultation on weight control and diet control   HYPERTENSION: BP one reading up today, KVO fluids, monitor BP for any adjustments in meds    ARF (acute renal failure): Improved   Hyponatremia: Improved   Dehydration: Improved, DC IV fluids    Leukocytosis: Likely secondary to acute pyelonephritis, improving  History of atrial flutter: s/p DCCV 07/31/2011, on amiodarone and Coumadin  Hypothyroidism:  - Continue Synthroid, recheck TSH, last TSH on 02/18/2012 was 22.8   DVT Prophylaxis:On Coumadin  Code Status:Full code  Disposition: hopefully in next 48-72 hours when culture sensitivities are available   LOS: 2 days   Prateek Knipple M.D. [facility] 04/11/2012, 11:04 AM Pager: [phone]  If 7PM-7AM, please contact night-coverage [email or web address] Password [email or web address]

## 2012-04-12 DIAGNOSIS — N12 Tubulo-interstitial nephritis, not specified as acute or chronic: Secondary | ICD-10-CM

## 2012-04-12 DIAGNOSIS — I10 Essential (primary) hypertension: Secondary | ICD-10-CM

## 2012-04-12 DIAGNOSIS — A413 Sepsis due to Hemophilus influenzae: Secondary | ICD-10-CM

## 2012-04-12 DIAGNOSIS — N179 Acute kidney failure, unspecified: Secondary | ICD-10-CM

## 2012-04-12 LAB — CULTURE, BLOOD (ROUTINE X 2): Culture  Setup Time: 201306211307

## 2012-04-12 LAB — PROTIME-INR: Prothrombin Time: 24.5 seconds — ABNORMAL HIGH (ref 11.6–15.2)

## 2012-04-12 LAB — CBC
HCT: 36.2 % (ref 36.0–46.0)
Hemoglobin: 12.1 g/dL (ref 12.0–15.0)
MCH: 27.8 pg (ref 26.0–34.0)
MCV: 83.2 fL (ref 78.0–100.0)
RBC: 4.35 MIL/uL (ref 3.87–5.11)
WBC: 9.9 10*3/uL (ref 4.0–10.5)

## 2012-04-12 LAB — GLUCOSE, CAPILLARY
Glucose-Capillary: 109 mg/dL — ABNORMAL HIGH (ref 70–99)
Glucose-Capillary: 118 mg/dL — ABNORMAL HIGH (ref 70–99)

## 2012-04-12 LAB — BASIC METABOLIC PANEL
BUN: 16 mg/dL (ref 6–23)
CO2: 26 mEq/L (ref 19–32)
Calcium: 9.5 mg/dL (ref 8.4–10.5)
Chloride: 104 mEq/L (ref 96–112)
Creatinine, Ser: 0.79 mg/dL (ref 0.50–1.10)
Glucose, Bld: 120 mg/dL — ABNORMAL HIGH (ref 70–99)

## 2012-04-12 LAB — HEMOGLOBIN A1C: Hgb A1c MFr Bld: 7.6 % — ABNORMAL HIGH (ref <5.7)

## 2012-04-12 MED ORDER — WARFARIN SODIUM 5 MG PO TABS
5.0000 mg | ORAL_TABLET | Freq: Once | ORAL | Status: AC
  Administered 2012-04-12: 5 mg via ORAL
  Filled 2012-04-12: qty 1

## 2012-04-12 MED ORDER — CIPROFLOXACIN HCL 500 MG PO TABS
500.0000 mg | ORAL_TABLET | Freq: Two times a day (BID) | ORAL | Status: DC
  Administered 2012-04-12 – 2012-04-13 (×2): 500 mg via ORAL
  Filled 2012-04-12 (×4): qty 1

## 2012-04-12 MED ORDER — LEVOTHYROXINE SODIUM 100 MCG PO TABS
100.0000 ug | ORAL_TABLET | Freq: Every day | ORAL | Status: DC
  Administered 2012-04-13: 100 ug via ORAL
  Filled 2012-04-12 (×2): qty 1

## 2012-04-12 NOTE — Progress Notes (Signed)
ANTICOAGULATION CONSULT NOTE - Follow Up Consult  Pharmacy Consult for Coumadin Indication: Afib/Aflutter  No Known Allergies  Patient Measurements: Height: 5\' 1"  (154.9 cm) Weight: 262 lb 5.6 oz (119 kg) IBW/kg (Calculated) : 47.8  Heparin Dosing Weight:   Vital Signs: Temp: 98.3 F (36.8 C) (06/24 0600) Temp src: Oral (06/24 0600) BP: 120/55 mmHg (06/24 0600) Pulse Rate: 60  (06/24 0600)  Labs:  Basename 04/12/12 0517 04/11/12 0645 04/10/12 0923 04/10/12 0630  HGB 12.1 -- -- 10.8*  HCT 36.2 -- -- 32.6*  PLT 343 -- -- 289  APTT -- -- -- --  LABPROT 24.5* 25.2* 28.0* --  INR 2.16* 2.24* 2.57* --  HEPARINUNFRC -- -- -- --  CREATININE 0.79 -- -- 0.92  CKTOTAL -- -- -- --  CKMB -- -- -- --  TROPONINI -- -- -- --    Estimated Creatinine Clearance: 81.1 ml/min (by C-G formula based on Cr of 0.79).  Assessment: 70yof on Coumadin for Afib/Aflutter. INR (2.16) is therapeutic. PTA regimen: 5mg  daily except 2.5mg  MWF. Patient had been taking Bactrim x 10 days PTA - Bactrim significantly affects Coumadin and INR so will monitor closely as patient may need larger doses of Coumadin than home regimen.  - H/H and Plts improving - No significant bleeding reported  Goal of Therapy:  INR 2-3   Plan:  1. Coumadin 5mg  po x 1 today 2. Discontinue Lovenox as INR has been therapeutic 3. Follow-up AM INR  [name] [phone] 04/12/2012,9:27 AM

## 2012-04-12 NOTE — Progress Notes (Signed)
Patient ID: [name]  female  FAO:[id number]    DOB: 28-Jun-1941    DOA: 04/09/2012  PCP: [name] Sorenson, MD  Subjective: No specific complaints today  Objective: Weight change: 3.015 kg (6 lb 10.4 oz)  Intake/Output Summary (Last 24 hours) at 04/12/12 1654 Last data filed at 04/12/12 0900  Gross per 24 hour  Intake    600 ml  Output      0 ml  Net    600 ml   Blood pressure 135/72, pulse 62, temperature 98.1 F (36.7 C), temperature source Oral, resp. rate 18, height 5\' 1"  (1.549 m), weight 119 kg (262 lb 5.6 oz), SpO2 95.00%.  Physical Exam: General: Alert and awake, oriented x3, not in any acute distress. HEENT: anicteric sclera, pupils reactive to light and accommodation, EOMI CVS: S1-S2 clear, no murmur rubs or gallops Chest: clear to auscultation bilaterally, no wheezing, rales or rhonchi Abdomen: soft nontender, nondistended, normal bowel sounds, no organomegaly Extremities: no cyanosis, clubbing or edema noted bilaterally Neuro: Cranial nerves II-XII intact, no focal neurological deficits  Lab Results: Basic Metabolic Panel:  Lab 04/12/12 6962 04/10/12 0630  NA 140 136  K 4.8 4.6  CL 104 104  CO2 26 19  GLUCOSE 120* 125*  BUN 16 19  CREATININE 0.79 0.92  CALCIUM 9.5 8.8  MG -- --  PHOS -- --   Liver Function Tests:  Lab 04/10/12 0630  AST 11  ALT 10  ALKPHOS 90  BILITOT 0.3  PROT 7.1  ALBUMIN 2.9*   CBC:  Lab 04/12/12 0517 04/10/12 0630  WBC 9.9 14.1*  NEUTROABS -- --  HGB 12.1 10.8*  HCT 36.2 32.6*  MCV 83.2 82.3  PLT 343 289     Lab 04/12/12 1635 04/12/12 1157 04/12/12 0752 04/11/12 2148 04/11/12 1635  GLUCAP 118* 109* 127* 120* 120*     Micro Results: Recent Results (from the past 240 hour(s))  CULTURE, BLOOD (ROUTINE X 2)     Status: Normal (Preliminary result)   Collection Time   04/09/12  3:30 AM      Component Value Range Status Comment   Specimen Description BLOOD LEFT ARM   Final    Special Requests BOTTLES DRAWN AEROBIC AND  ANAEROBIC [facility]   Final    Culture  Setup Time [id number]   Final    Culture     Final    Value: GRAM NEGATIVE RODS     Note: Gram Stain Report Called to,Read Back By and Verified With: [name] @ 2344 ON 04/09/2012 HAJAM   Report Status PENDING   Incomplete   CULTURE, BLOOD (ROUTINE X 2)     Status: Normal (Preliminary result)   Collection Time   04/09/12  3:35 AM      Component Value Range Status Comment   Specimen Description BLOOD LEFT ARM   Final    Special Requests BOTTLES DRAWN AEROBIC AND ANAEROBIC [facility]   Final    Culture  Setup Time [id number]   Final    Culture     Final    Value: GRAM NEGATIVE RODS     Note: Gram Stain Report Called to,Read Back By and Verified With: [name] @ 2344 ON 04/09/2012 HAJAM   Report Status PENDING   Incomplete     Studies/Results:  Dg Chest 2 View 03/19/2012  * IMPRESSION: Cardiac enlargement.  No evidence of active pulmonary disease.  Original Report Authenticated By: [name], M.D.   CT abdomen and pelvis  on 04/10/2012 IMPRESSION:  Mid - lower left renal heterogeneity with adjacent inflammation compatible with pyelonephritis. No evidence of renal or abdominal/pelvic abscess.  Cardiomegaly.  Stable right adrenal adenoma   Medications: Scheduled Meds:    . amiodarone  200 mg Oral Custom  . amLODipine  5 mg Oral Daily  . cefTRIAXone (ROCEPHIN)  IV  1 g Intravenous Q24H  . cloNIDine  0.2 mg Oral BID  . docusate sodium  100 mg Oral BID  . insulin aspart  0-15 Units Subcutaneous TID WC  . insulin aspart  0-5 Units Subcutaneous QHS  . levothyroxine  100 mcg Oral Daily  . simvastatin  5 mg Oral q1800  . warfarin  5 mg Oral ONCE-1800  . warfarin  5 mg Oral ONCE-1800  . Warfarin - Pharmacist Dosing Inpatient   Does not apply q1800  . DISCONTD: enoxaparin  40 mg Subcutaneous Q24H  . DISCONTD: levothyroxine  75 mcg Oral Daily   Continuous Infusions:     Assessment/Plan: Principal Problem:  *Acute left  pyelonephritis with Escherichia coli bacteremia/sepsis: No renal abscess - Discussed with Dr. Jerolyn Center, ID on the phone in detail, recommended to transition to ciprofloxacin 500mg  twice a day for 14 days treatment for pyelonephritis and Escherichia coli sepsis  Active Problems:  DM: Poorly controlled, hemoglobin A1c 8.2 - cont moderate sliding scale insulin   OBESITY, MORBID:  weight control and diet control   HYPERTENSION: Stable and controlled   ARF (acute renal failure): Improved   Hyponatremia: Improved   Dehydration: Improved, DC IV fluids    Leukocytosis: Likely secondary to acute pyelonephritis, resolved   History of atrial flutter: s/p DCCV 07/31/2011, on amiodarone and Coumadin  Hypothyroidism:  - Continue Synthroid, increase100 mcg daily, TSH 11.1, last TSH on 02/18/2012 was 22.8,  improving   DVT Prophylaxis:On Coumadin  Code Status:Full code  Disposition: hopefully tomorrow, discussed in detail with patient's son, [name] Needle in [place], phone number [phone].   LOS: 3 days   [name] M.D. [facility] 04/12/2012, 4:54 PM Pager: [phone]  If 7PM-7AM, please contact night-coverage [email or web address] Password [email or web address]

## 2012-04-12 NOTE — Progress Notes (Signed)
PT Cancellation Note  Treatment cancelled today due to patient receiving procedure or test. Pt off the floor for CT. Will f/u tomorrow.   [facility] HELEN 04/12/2012, 2:34 PM Pager: [phone]

## 2012-04-12 NOTE — Progress Notes (Signed)
Met with pt re d/c needs, pt has DME and daughter is care giver. MD would home PT eval be appropriate? Please order if you agree. Johny Shock RN MPH Case Manager [phone]

## 2012-04-13 ENCOUNTER — Ambulatory Visit: Payer: Medicare Other | Admitting: Physical Medicine & Rehabilitation

## 2012-04-13 DIAGNOSIS — I10 Essential (primary) hypertension: Secondary | ICD-10-CM

## 2012-04-13 DIAGNOSIS — N179 Acute kidney failure, unspecified: Secondary | ICD-10-CM

## 2012-04-13 DIAGNOSIS — N12 Tubulo-interstitial nephritis, not specified as acute or chronic: Secondary | ICD-10-CM

## 2012-04-13 DIAGNOSIS — A413 Sepsis due to Hemophilus influenzae: Secondary | ICD-10-CM

## 2012-04-13 LAB — CBC
HCT: 36.4 % (ref 36.0–46.0)
MCH: 27.4 pg (ref 26.0–34.0)
MCV: 83.1 fL (ref 78.0–100.0)
Platelets: 379 10*3/uL (ref 150–400)
RDW: 15.2 % (ref 11.5–15.5)
WBC: 10.5 10*3/uL (ref 4.0–10.5)

## 2012-04-13 LAB — BASIC METABOLIC PANEL
BUN: 20 mg/dL (ref 6–23)
CO2: 29 mEq/L (ref 19–32)
Calcium: 9.6 mg/dL (ref 8.4–10.5)
Chloride: 100 mEq/L (ref 96–112)
Creatinine, Ser: 0.85 mg/dL (ref 0.50–1.10)
GFR calc Af Amer: 80 mL/min — ABNORMAL LOW (ref >90)

## 2012-04-13 LAB — GLUCOSE, CAPILLARY: Glucose-Capillary: 133 mg/dL — ABNORMAL HIGH (ref 70–99)

## 2012-04-13 MED ORDER — LEVOTHYROXINE SODIUM 100 MCG PO TABS: 100.0000 ug | ORAL_TABLET | Freq: Every day | ORAL | Status: DC

## 2012-04-13 MED ORDER — CIPROFLOXACIN HCL 500 MG PO TABS: 500.0000 mg | ORAL_TABLET | Freq: Two times a day (BID) | ORAL | Status: AC

## 2012-04-13 MED ORDER — WARFARIN SODIUM 5 MG PO TABS
5.0000 mg | ORAL_TABLET | Freq: Once | ORAL | Status: DC
  Filled 2012-04-13: qty 1

## 2012-04-13 NOTE — Discharge Summary (Signed)
Physician Discharge Summary  Patient ID: [name] MRN: [id number] DOB/AGE: [birth year] 69 y.o.  Admit date: 04/09/2012 Discharge date: 04/13/2012  Primary Care Physician:  Norberto Sorenson, MD  Discharge Diagnoses:    .Acute left pyelonephritis with Escherichia coli UTI  . Escherichia coli sepsis/bacteremia  .ARF (acute renal failure) resolved  .Hyponatremia resolved  .Dehydration .OBESITY, MORBID .Leukocytosis .DM .HYPERTENSION  Consults:  Infectious disease, Dr. Ilsa Iha via phone consultation  Discharge Medications: Medication List  As of 04/13/2012 10:07 AM   STOP taking these medications         sulfamethoxazole-trimethoprim 800-160 MG per tablet         TAKE these medications         amiodarone 200 MG tablet   Commonly known as: PACERONE   Take 200 mg by mouth See admin instructions. Five days weekly, Monday -Friday      amLODipine 5 MG tablet   Commonly known as: NORVASC   Take one tablet by mouth twice daily.      ciprofloxacin 500 MG tablet   Commonly known as: CIPRO   Take 1 tablet (500 mg total) by mouth 2 (two) times daily. X 14 days      cloNIDine 0.2 MG tablet   Commonly known as: CATAPRES   Take 1 tablet (0.2 mg total) by mouth 2 (two) times daily.      furosemide 40 MG tablet   Commonly known as: LASIX   Take 1 tablet (40 mg total) by mouth daily.      levothyroxine 100 MCG tablet   Commonly known as: SYNTHROID, LEVOTHROID   Take 1 tablet (100 mcg total) by mouth daily.      lisinopril 40 MG tablet   Commonly known as: PRINIVIL,ZESTRIL   Take 40 mg by mouth daily.      potassium chloride SA 20 MEQ tablet   Commonly known as: K-DUR,KLOR-CON   Take 40-60 mEq by mouth 2 (two) times daily. 40 meq in the morning and 60 meq in the evening      potassium chloride SA 20 MEQ tablet   Commonly known as: K-DUR,KLOR-CON   Take 40 mEq by mouth daily.      pravastatin 80 MG tablet   Commonly known as: PRAVACHOL   Take 80 mg by mouth daily.     warfarin 5 MG tablet   Commonly known as: COUMADIN   Take 2.5-5 mg by mouth daily. Takes 1/2 tablet (2.5 mg) on Monday, Wednesday and Friday. Take 1 tablet (5 mg) on all other days.             Brief H and P: For complete details please refer to admission H and P, but in brief patient is a 71 year old female with history of diabetes, morbid opacity who apparently started having flank pain and dysuria earlier in the week of admission. Patient was seen by primary care physician and was started on Septra. Patient had 4 days of her treatment however continued to spike fever up to 101. In the ED patient was noted to have hypotension with systolic blood pressure in upper 80s and low 90s. Patient denied any diarrhea. Patient was found to have acute renal insufficiency and leukocytosis at the time of admission with UTI.    Hospital Course:   Acute left pyelonephritis with Escherichia coli bacteremia/sepsis: No renal abscess. Patient was admitted to the hospital and placed on IV antibiotics, Rocephin. Urine culture and blood cultures were positive for Escherichia coli. Repeat blood cultures drawn on  04/10/2012 has been negative so far. CT scan of the abdomen and pelvis was done and was positive for left pyelonephritis but no renal abscess. I discussed in detail with Dr. Jerolyn Center, ID on the phone who recommended recommended to transition to ciprofloxacin 500mg  twice a day for 14 days treatment for pyelonephritis and Escherichia coli sepsis based on the sensitivities   DM: Poorly controlled, hemoglobin A1c 8.2, patient was placed on moderate sliding scale while inpatient   OBESITY, MORBID: Patient was recommended weight control and diet control   HYPERTENSION: Stable and remained controlled   ARF (acute renal failure) with hypernatremia and dehydration: Resolved, Lasix and lisinopril were held during the hospitalization. Patient is now eating and drinking well. She can resume her home medications  as usual.   History of atrial flutter: s/p DCCV 07/31/2011, on amiodarone and Coumadin. Patient has a followup appointment with her cardiologist Dr. Tollie Pizza on 04/15/2012.   Hypothyroidism: Synthroid was increased to100 mcg daily, TSH checked was 11.1, last TSH on 02/18/2012 was 22.8, improving. Patient was explained to obtain TSH in next 4-6 weeks for close monitoring of the dose adjustment.   Day of Discharge BP 117/64  Pulse 62  Temp 98 F (36.7 C) (Oral)  Resp 19  Ht 5\' 1"  (1.549 m)  Wt 116.3 kg (256 lb 6.3 oz)  BMI 48.45 kg/m2  SpO2 96%  Physical Exam: General: Alert and awake oriented x3 not in any acute distress. HEENT: anicteric sclera, pupils reactive to light and accommodation CVS: S1-S2 clear no murmur rubs or gallops Chest: clear to auscultation bilaterally, no wheezing rales or rhonchi Abdomen: soft nontender, nondistended, normal bowel sounds, no organomegaly Extremities: no cyanosis, clubbing or edema noted bilaterally Neuro: Cranial nerves II-XII intact, no focal neurological deficits   The results of significant diagnostics from this hospitalization (including imaging, microbiology, ancillary and laboratory) are listed below for reference.    LAB RESULTS: Basic Metabolic Panel:  Lab 04/13/12 0981 04/12/12 0517  NA 138 140  K 4.6 4.8  CL 100 104  CO2 29 26  GLUCOSE 129* 120*  BUN 20 16  CREATININE 0.85 0.79  CALCIUM 9.6 9.5  MG -- --  PHOS -- --   Liver Function Tests:  Lab 04/10/12 0630  AST 11  ALT 10  ALKPHOS 90  BILITOT 0.3  PROT 7.1  ALBUMIN 2.9*   CBC:  Lab 04/13/12 0620 04/12/12 0517  WBC 10.5 9.9  NEUTROABS -- --  HGB 12.0 12.1  HCT 36.4 36.2  MCV 83.1 --  PLT 379 343   CBG:  Lab 04/13/12 0739 04/12/12 2038  GLUCAP 133* 127*    Significant Diagnostic Studies:  No results found.   Disposition and Follow-up: Discharge Orders    Future Appointments: Provider: Department: Dept Phone: Center:   04/15/2012 10:00 AM Duke Salvia, MD [facility] [phone] [id number]   04/20/2012 10:45 AM [facility] [phone]   07/12/2012 9:00 AM Barbaraann Share, MD [facility] [phone] None     Future Orders Please Complete By Expires   Diet Carb Modified      Increase activity slowly      Discharge instructions      Comments:   Your synthroid dose is increased to daily. Please check TSH in 4 weeks.       DISPOSITION: Home DIET: Carb modified diet ACTIVITY: As tolerated  DISCHARGE FOLLOW-UP Follow-up Information    Follow up with Norberto Sorenson, MD on 04/14/2012. (please follow-up  on your appt tomorrow, get PT/INR checked )    Contact information:   [address] [place] [address] [phone]       Follow up with Sherryl Manges, MD. Schedule an appointment as soon as possible for a visit in 10 days. (please keep your coming appointment )    Contact information:   [address], [address] [place] [address] [phone]          Time spent on Discharge: 45 minutes  Signed:   Dashel Goines M.D. [facility] 04/13/2012, 10:07 AM Pager: [phone]  If 7PM-7AM, please contact night-coverage [email or web address] Password [email or web address]

## 2012-04-13 NOTE — Progress Notes (Signed)
Pt discharged home with instructions and prescriptions. Pt educated before discharge and told to call MD or go to the hospital if an emergency occurs.

## 2012-04-13 NOTE — Progress Notes (Signed)
Physical Therapy Note:  Orders received, chart reviewed; Spoke with pt and nursing earlier today around 8:30; Pt reports no problems getting to/from bathroom, is managing mobility independently in room;   No PT needs noted;  Will sign off. [place], [place] [phone]

## 2012-04-13 NOTE — Progress Notes (Signed)
  ANTICOAGULATION CONSULT NOTE - Follow Up Consult  Pharmacy Consult for Coumadin Indication: Afib/Aflutter  No Known Allergies  Patient Measurements: Height: 5\' 1"  (154.9 cm) Weight: 256 lb 6.3 oz (116.3 kg) IBW/kg (Calculated) : 47.8   Vital Signs: Temp: 98 F (36.7 C) (06/25 0759) Temp src: Oral (06/25 0759) BP: 117/64 mmHg (06/25 0759) Pulse Rate: 62  (06/25 0759)  Labs:  [name] 04/13/12 0620 04/12/12 0517 04/11/12 0645  HGB 12.0 12.1 --  HCT 36.4 36.2 --  PLT 379 343 --  APTT -- -- --  LABPROT 23.8* 24.5* 25.2*  INR 2.09* 2.16* 2.24*  HEPARINUNFRC -- -- --  CREATININE 0.85 0.79 --  CKTOTAL -- -- --  CKMB -- -- --  TROPONINI -- -- --    Estimated Creatinine Clearance: 75.2 ml/min (by C-G formula based on Cr of 0.85).  Assessment: 70yof on Coumadin for Afib/Aflutter. INR (2.09) is therapeutic. PTA regimen: 5mg  daily except 2.5mg  MWF. Please note, patient has started Cipro which interacts with Coumadin and Amiodarone - monitor INR and QTc closely. - H/H and Plts stable - No significant bleeding reported  Goal of Therapy:  INR 2-3   Plan:  1. Coumadin 5mg  po x 1 today 2. Follow-up AM INR and discharge plans   [name] [phone] 04/13/2012,10:44 AM

## 2012-04-13 NOTE — Progress Notes (Signed)
   CARE MANAGEMENT NOTE 04/13/2012  Patient:  [name]   Account Number:  [id number]  Date Initiated:  04/12/2012  Documentation initiated by:  Kalvyn Desa  Subjective/Objective Assessment:   Pt for d/c to home may benefit from HHPT, pt daughter is caregiver.     Action/Plan:   Note left for MD requesting order for HHPT eval   Anticipated DC Date:  04/13/2012   Anticipated DC Plan:  HOME/SELF CARE         Choice offered to / List presented to:             Status of service:  Completed, signed off Medicare Important Message given?   (If response is "NO", the following Medicare IM given date fields will be blank) Date Medicare IM given:   Date Additional Medicare IM given:    Discharge Disposition:  HOME/SELF CARE  Per UR Regulation:    If discussed at Long Length of Stay Meetings, dates discussed:    Comments:

## 2012-04-15 ENCOUNTER — Encounter: Payer: Self-pay | Admitting: Internal Medicine

## 2012-04-15 ENCOUNTER — Ambulatory Visit (INDEPENDENT_AMBULATORY_CARE_PROVIDER_SITE_OTHER): Payer: Medicare Other | Admitting: Internal Medicine

## 2012-04-15 VITALS — BP 138/70 | HR 86 | Ht 61.0 in | Wt 254.0 lb

## 2012-04-15 DIAGNOSIS — R001 Bradycardia, unspecified: Secondary | ICD-10-CM

## 2012-04-15 DIAGNOSIS — I5032 Chronic diastolic (congestive) heart failure: Secondary | ICD-10-CM

## 2012-04-15 DIAGNOSIS — I10 Essential (primary) hypertension: Secondary | ICD-10-CM

## 2012-04-15 DIAGNOSIS — I4891 Unspecified atrial fibrillation: Secondary | ICD-10-CM

## 2012-04-15 DIAGNOSIS — I498 Other specified cardiac arrhythmias: Secondary | ICD-10-CM

## 2012-04-15 MED ORDER — CLONIDINE HCL 0.2 MG PO TABS: 0.2000 mg | ORAL_TABLET | Freq: Two times a day (BID) | ORAL | Status: DC

## 2012-04-15 MED ORDER — AMLODIPINE BESYLATE 5 MG PO TABS: ORAL_TABLET | ORAL | Status: DC

## 2012-04-15 NOTE — Assessment & Plan Note (Signed)
Holding sinus on amio

## 2012-04-15 NOTE — Progress Notes (Signed)
HPI  [name] is a 71 y.o. female Seen in followup for atrial arrhythmias associated with diastolic heart failure. She's been tried on flecainide, dofetilide, and most recently has been on amiodarone.  Echocardiogram November 2012 demonstrated normal left ventricular function with mild LVH; atrial chambers were also normal in size   TSH 11 6/13 <<22<<31  Just hospitalized for urosepsis and found to have hyperaklaemia   Past Medical History  Diagnosis Date  . Atrial fibrillation /flutter   . Hypertension   . Diabetes mellitus   . Hyperlipidemia   . Gout   . 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 Diastolic CHF, chronic     echo 2006 - ef 55-65%; mild diast dysfxn  . Arthritis   . Morbid obesity   . OSA (obstructive sleep apnea)     uses cpap  . CHF (congestive heart failure)   . Ejection fraction     EF 60%, echo, September 19, 2011  . Back pain     Past Surgical History  Procedure Date  . Cholecystectomy   . Total abdominal hysterectomy   . Appendectomy   . Tonsillectomy 1982  . Cardioversion 10/22/2011    Procedure: CARDIOVERSION;  Surgeon: Duke Salvia, MD;  Location: [facility] OR;  Service: Cardiovascular;  Laterality: N/A;    Current Outpatient Prescriptions  Medication Sig Dispense Refill  . amiodarone (PACERONE) 200 MG tablet Take 200 mg by mouth See admin instructions. Five days weekly, Monday -Friday      . amLODipine (NORVASC) 5 MG tablet Take one tablet by mouth twice daily.  60 tablet  5  . ciprofloxacin (CIPRO) 500 MG tablet Take 1 tablet (500 mg total) by mouth 2 (two) times daily. X 14 days  30 tablet  0  . cloNIDine (CATAPRES) 0.2 MG tablet Take 1 tablet (0.2 mg total) by mouth 2 (two) times daily.  60 tablet  0  . furosemide (LASIX) 40 MG tablet Take 1 tablet (40 mg total) by mouth daily.  30 tablet  6  . levothyroxine (SYNTHROID, LEVOTHROID) 100 MCG tablet Take 1 tablet (100 mcg total) by mouth daily.  90 tablet  3  . lisinopril  (PRINIVIL,ZESTRIL) 40 MG tablet Take 40 mg by mouth daily.        . potassium chloride SA (K-DUR,KLOR-CON) 20 MEQ tablet Take 40-60 mEq by mouth 2 (two) times daily. 40 meq in the morning and 60 meq in the evening      . potassium chloride SA (K-DUR,KLOR-CON) 20 MEQ tablet Take 20 mEq by mouth 2 (two) times daily. Take 40 MEQ in AM and 60 MEQ in PM      . pravastatin (PRAVACHOL) 80 MG tablet Take 80 mg by mouth daily.       Marland Kitchen spironolactone (ALDACTONE) 25 MG tablet       . warfarin (COUMADIN) 5 MG tablet Take 2.5-5 mg by mouth daily. Takes 1/2 tablet (2.5 mg) on Monday, Wednesday and Friday. Take 1 tablet (5 mg) on all other days.      . potassium chloride SA (K-DUR,KLOR-CON) 20 MEQ tablet Take 40 mEq by mouth daily.          No Known Allergies  Review of Systems negative except from HPI and PMH  Physical Exam BP 138/70  Pulse 86  Ht 5\' 1"  (1.549 m)  Wt 254 lb (115.214 kg)  BMI 47.99 kg/m2 Well developed and well nourished in no acute distress HENT normal E  scleral and icterus clear Neck Supple JVP flat; carotids brisk and full Clear to ausculation Regular rate and rhythm, no murmurs gallops or rub Soft with active bowel sounds No clubbing cyanosis Trace Edema Alert and oriented, grossly normal motor and sensory function Skin Warm and Dry  ECG sinus at 86 17/07/37  Assessment and  Plan

## 2012-04-15 NOTE — Assessment & Plan Note (Signed)
euvolemic  Will try and sort out K  She will call but for now will reduce

## 2012-04-15 NOTE — Patient Instructions (Addendum)
Your physician has recommended you make the following change in your medication:  1) Take potassium 10 meq two tablets by mouth once daily.  Keep your appointment for lab work in 2 weeks with [facility]: bmp  Your physician wants you to follow-up in: 6 months with Dr. Graciela Husbands. You will receive a reminder letter in the mail two months in advance. If you don't receive a letter, please call our office to schedule the follow-up appointment.

## 2012-04-16 LAB — CULTURE, BLOOD (ROUTINE X 2)
Culture  Setup Time: 201306221725
Culture  Setup Time: 201306221725
Culture: NO GROWTH

## 2012-04-17 NOTE — Assessment & Plan Note (Signed)
suarprisingly not an issue

## 2012-04-20 ENCOUNTER — Ambulatory Visit (HOSPITAL_COMMUNITY)
Admission: RE | Admit: 2012-04-20 | Discharge: 2012-04-20 | Disposition: A | Discharge status: Home / Self Care | Payer: Medicare Other | Source: Ambulatory Visit | Attending: Family Medicine | Admitting: Family Medicine

## 2012-04-20 DIAGNOSIS — Z1231 Encounter for screening mammogram for malignant neoplasm of breast: Secondary | ICD-10-CM | POA: Insufficient documentation

## 2012-04-21 NOTE — Addendum Note (Signed)
Addended by: Early Chars on: 04/21/2012 09:49 AM   Modules accepted: Orders

## 2012-05-07 ENCOUNTER — Telehealth: Payer: Self-pay | Admitting: Internal Medicine

## 2012-05-07 DIAGNOSIS — I4891 Unspecified atrial fibrillation: Secondary | ICD-10-CM

## 2012-05-07 NOTE — Telephone Encounter (Signed)
I spoke with the patient. She states that [facility] stopped her potassium about 3 weeks ago. She has been urinating quite a bit. She is still on her lasix. I explained she needs to have her potassium checked. She will come here on Monday and let us check that for her.

## 2012-05-07 NOTE — Telephone Encounter (Signed)
New msg Pt wants to discuss potassium. Please call

## 2012-05-09 ENCOUNTER — Emergency Department (HOSPITAL_COMMUNITY): Payer: Medicare Other

## 2012-05-09 ENCOUNTER — Emergency Department (HOSPITAL_COMMUNITY)
Admission: EM | Admit: 2012-05-09 | Discharge: 2012-05-09 | Disposition: A | Discharge status: Home / Self Care | Payer: Medicare Other | Attending: Emergency Medicine | Admitting: Emergency Medicine

## 2012-05-09 DIAGNOSIS — I480 Paroxysmal atrial fibrillation: Secondary | ICD-10-CM

## 2012-05-09 DIAGNOSIS — I509 Heart failure, unspecified: Secondary | ICD-10-CM | POA: Insufficient documentation

## 2012-05-09 DIAGNOSIS — E119 Type 2 diabetes mellitus without complications: Secondary | ICD-10-CM | POA: Insufficient documentation

## 2012-05-09 DIAGNOSIS — E785 Hyperlipidemia, unspecified: Secondary | ICD-10-CM | POA: Insufficient documentation

## 2012-05-09 DIAGNOSIS — Z8739 Personal history of other diseases of the musculoskeletal system and connective tissue: Secondary | ICD-10-CM | POA: Insufficient documentation

## 2012-05-09 DIAGNOSIS — I4891 Unspecified atrial fibrillation: Secondary | ICD-10-CM | POA: Insufficient documentation

## 2012-05-09 DIAGNOSIS — Z79899 Other long term (current) drug therapy: Secondary | ICD-10-CM | POA: Insufficient documentation

## 2012-05-09 DIAGNOSIS — Z9089 Acquired absence of other organs: Secondary | ICD-10-CM | POA: Insufficient documentation

## 2012-05-09 LAB — CBC
HCT: 37.7 % (ref 36.0–46.0)
MCH: 28.3 pg (ref 26.0–34.0)
MCV: 84.5 fL (ref 78.0–100.0)
RBC: 4.46 MIL/uL (ref 3.87–5.11)
RDW: 14.9 % (ref 11.5–15.5)
WBC: 13.5 10*3/uL — ABNORMAL HIGH (ref 4.0–10.5)

## 2012-05-09 LAB — PROTIME-INR: INR: 2.3 — ABNORMAL HIGH (ref 0.00–1.49)

## 2012-05-09 LAB — URINALYSIS, ROUTINE W REFLEX MICROSCOPIC
Glucose, UA: NEGATIVE mg/dL
Nitrite: NEGATIVE
Specific Gravity, Urine: 1.01 (ref 1.005–1.030)
pH: 6 (ref 5.0–8.0)

## 2012-05-09 LAB — URINE MICROSCOPIC-ADD ON

## 2012-05-09 LAB — POCT I-STAT, CHEM 8
BUN: 31 mg/dL — ABNORMAL HIGH (ref 6–23)
Calcium, Ion: 1.16 mmol/L (ref 1.13–1.30)
Chloride: 106 mEq/L (ref 96–112)
Glucose, Bld: 180 mg/dL — ABNORMAL HIGH (ref 70–99)
TCO2: 24 mmol/L (ref 0–100)

## 2012-05-09 MED ORDER — SODIUM CHLORIDE 0.9 % IV SOLN
INTRAVENOUS | Status: DC
  Administered 2012-05-09: 02:00:00 via INTRAVENOUS

## 2012-05-09 MED ORDER — DILTIAZEM HCL 100 MG IV SOLR
5.0000 mg/h | INTRAVENOUS | Status: DC
  Filled 2012-05-09: qty 100

## 2012-05-09 NOTE — ED Notes (Signed)
MD agreed to hold Cardizem at this time.

## 2012-05-09 NOTE — ED Notes (Signed)
The patient is AOx4 and comfortable with her discharge instructions. 

## 2012-05-09 NOTE — ED Provider Notes (Signed)
History     CSN: [id number]  Arrival date & time 05/09/12  0111   First MD Initiated Contact with Patient 05/09/12 0115      No chief complaint on file.   (Consider location/radiation/quality/duration/timing/severity/associated sxs/prior treatment) HPI History provided by patient. Palpitations onset tonight. Patient checked her heart at home and found to be very fast and called EMS. Per EMS was in rapid atrial fibrillation. Patient has history of same and takes Coumadin. No chest pain or shortness of breath. No recent fevers or chills. No nausea vomiting or diarrhea. Followed by cardiology with history of CHF, diabetes, hypertension and hyperlipidemia. Symptoms moderate in severity. History of same. Past Medical History  Diagnosis Date  . Atrial fibrillation /flutter   . Hypertension   . Diabetes mellitus   . Hyperlipidemia   . Gout   . 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 Diastolic CHF, chronic     echo 2006 - ef 55-65%; mild diast dysfxn  . Arthritis   . Morbid obesity   . OSA (obstructive sleep apnea)     uses cpap  . CHF (congestive heart failure)   . Ejection fraction     EF 60%, echo, September 19, 2011  . Back pain     Past Surgical History  Procedure Date  . Cholecystectomy   . Total abdominal hysterectomy   . Appendectomy   . Tonsillectomy 1982  . Cardioversion 10/22/2011    Procedure: CARDIOVERSION;  Surgeon: Duke Salvia, MD;  Location: [facility] OR;  Service: Cardiovascular;  Laterality: N/A;    Family History  Problem Relation Age of Onset  . Heart disease Father   . Hypertension Father   . Breast cancer Sister   . Cancer Sister     breast    History  Substance Use Topics  . Smoking status: Never Smoker   . Smokeless tobacco: Never Used  . Alcohol Use: No    OB History    Grav Para Term Preterm Abortions TAB SAB Ect Mult Living                  Review of Systems  Constitutional: Negative for fever and chills.  HENT:  Negative for neck pain and neck stiffness.   Eyes: Negative for pain.  Respiratory: Negative for shortness of breath.   Cardiovascular: Positive for palpitations. Negative for chest pain.  Gastrointestinal: Negative for abdominal pain.  Genitourinary: Negative for dysuria.  Musculoskeletal: Negative for back pain.  Skin: Negative for rash.  Neurological: Negative for headaches.  All other systems reviewed and are negative.    Allergies  Review of patient's allergies indicates no known allergies.  Home Medications   Current Outpatient Rx  Name Route Sig Dispense Refill  . AMIODARONE HCL 200 MG PO TABS Oral Take 200 mg by mouth See admin instructions. Five days weekly, Monday -Friday    . AMLODIPINE BESYLATE 5 MG PO TABS  Take one tablet by mouth twice daily. 60 tablet 6  . CLONIDINE HCL 0.2 MG PO TABS Oral Take 1 tablet (0.2 mg total) by mouth 2 (two) times daily. 60 tablet 6  . FUROSEMIDE 40 MG PO TABS Oral Take 1 tablet (40 mg total) by mouth daily. 30 tablet 6  . LEVOTHYROXINE SODIUM 100 MCG PO TABS Oral Take 1 tablet (100 mcg total) by mouth daily. 90 tablet 3  . LISINOPRIL 40 MG PO TABS Oral Take 40 mg by mouth daily.      Marland Kitchen  POTASSIUM CHLORIDE CRYS ER 10 MEQ PO TBCR  Take two tablets by mouth daily    . PRAVASTATIN SODIUM 80 MG PO TABS Oral Take 80 mg by mouth daily.     Marland Kitchen SPIRONOLACTONE 25 MG PO TABS      . WARFARIN SODIUM 5 MG PO TABS Oral Take 2.5-5 mg by mouth daily. Takes 1/2 tablet (2.5 mg) on Monday, Wednesday and Friday. Take 1 tablet (5 mg) on all other days.      There were no vitals taken for this visit.  Physical Exam  Constitutional: She is oriented to person, place, and time. She appears well-developed and well-nourished.  HENT:  Head: Normocephalic and atraumatic.  Eyes: Conjunctivae and EOM are normal. Pupils are equal, round, and reactive to light.  Neck: Trachea normal. Neck supple. No thyromegaly present.  Cardiovascular: Normal rate, regular rhythm, S1  normal, S2 normal and normal pulses.     No systolic murmur is present   No diastolic murmur is present  Pulses:      Radial pulses are 2+ on the right side, and 2+ on the left side.  Pulmonary/Chest: Effort normal and breath sounds normal. She has no wheezes. She has no rhonchi. She has no rales. She exhibits no tenderness.  Abdominal: Soft. Normal appearance and bowel sounds are normal. There is no tenderness. There is no CVA tenderness and negative Murphy's sign.  Musculoskeletal:       BLE:s Calves nontender, no cords or erythema, negative Homans sign  Neurological: She is alert and oriented to person, place, and time. She has normal strength. No cranial nerve deficit or sensory deficit. GCS eye subscore is 4. GCS verbal subscore is 5. GCS motor subscore is 6.  Skin: Skin is warm and dry. No rash noted. She is not diaphoretic.  Psychiatric: Her speech is normal.       Cooperative and appropriate    ED Course  Procedures (including critical care time)  Results for orders placed during the hospital encounter of 05/09/12  CBC      Component Value Range   WBC 13.5 (*) 4.0 - 10.5 K/uL   RBC 4.46  3.87 - 5.11 MIL/uL   Hemoglobin 12.6  12.0 - 15.0 g/dL   HCT 16.1  09.6 - 04.5 %   MCV 84.5  78.0 - 100.0 fL   MCH 28.3  26.0 - 34.0 pg   MCHC 33.4  30.0 - 36.0 g/dL   RDW 40.9  81.1 - 91.4 %   Platelets 334  150 - 400 K/uL  POCT I-STAT, CHEM 8      Component Value Range   Sodium 140  135 - 145 mEq/L   Potassium 4.1  3.5 - 5.1 mEq/L   Chloride 106  96 - 112 mEq/L   BUN 31 (*) 6 - 23 mg/dL   Creatinine, Ser 7.82  0.50 - 1.10 mg/dL   Glucose, Bld 956 (*) 70 - 99 mg/dL   Calcium, Ion 2.13  0.86 - 1.30 mmol/L   TCO2 24  0 - 100 mmol/L   Hemoglobin 13.3  12.0 - 15.0 g/dL   HCT 57.8  46.9 - 62.9 %  POCT I-STAT TROPONIN I      Component Value Range   Troponin i, poc 0.00  0.00 - 0.08 ng/mL   Comment 3           PROTIME-INR      Component Value Range   Prothrombin Time 25.7 (*) 11.6 -  15.2 seconds   INR 2.30 (*) 0.00 - 1.49  URINALYSIS, ROUTINE W REFLEX MICROSCOPIC      Component Value Range   Color, Urine STRAW (*) YELLOW   APPearance CLEAR  CLEAR   Specific Gravity, Urine 1.010  1.005 - 1.030   pH 6.0  5.0 - 8.0   Glucose, UA NEGATIVE  NEGATIVE mg/dL   Hgb urine dipstick NEGATIVE  NEGATIVE   Bilirubin Urine NEGATIVE  NEGATIVE   Ketones, ur NEGATIVE  NEGATIVE mg/dL   Protein, ur NEGATIVE  NEGATIVE mg/dL   Urobilinogen, UA 0.2  0.0 - 1.0 mg/dL   Nitrite NEGATIVE  NEGATIVE   Leukocytes, UA TRACE (*) NEGATIVE  URINE MICROSCOPIC-ADD ON      Component Value Range   Squamous Epithelial / LPF FEW (*) RARE   WBC, UA 0-2  <3 WBC/hpf   RBC / HPF 0-2  <3 RBC/hpf   Bacteria, UA FEW (*) RARE     Dg Chest Portable 1 View  05/09/2012  *RADIOLOGY REPORT*  Clinical Data: Chest pain.  Hypertension.  Palpitations.  PORTABLE CHEST - 1 VIEW  Comparison: 03/19/2012  Findings: Shallow inspiration.  Borderline heart size with pulmonary vascularity normal for technique.  No obvious edema.  No focal consolidation.  No blunting of costophrenic angles.  No pneumothorax.  Tortuous aorta.  Degenerative changes in the spine and shoulders.  No significant change since previous study.  IMPRESSION: Stable appearance of the chest.  No evidence of active disease.  Original Report Authenticated By: Marlon Pel, M.D.    Date: 05/09/2012  Rate: 89  Rhythm: normal sinus rhythm  QRS Axis: normal  Intervals: normal  ST/T Wave abnormalities: nonspecific ST changes  Conduction Disutrbances:none  Narrative Interpretation:   Old EKG Reviewed: unchanged  Palpitations resolved prior to my evaluation and patient found to be in normal sinus rhythm. Chest x-ray, EKG, labs and UA obtained and reviewed as above.  Patient observed in the emergency department without return of symptoms and remains in normal sinus rhythm. No indication for admission at this time. No ACS symptoms.  MDM   Old records  reviewed. Nursing notes reviewed. EMS rhythm strip is reviewed consistent with rapid A. Fib, labs and imaging obtained and reviewed as well. Patient stable for discharge home.        Sunnie Nielsen, MD 05/09/12 [phone]

## 2012-05-09 NOTE — ED Notes (Signed)
Per EMS pt called stated heart is racing and when pt was placed on monitor, 12 leads shows Afib/RVR, pt denies any CP or N/N, stated heart feel flutter at times

## 2012-05-09 NOTE — ED Notes (Signed)
Holding Cardizem since the patient is in normal sinus rhythm with a heart rate in the low 80's.  I will advise the MD.

## 2012-05-09 NOTE — ED Notes (Signed)
MD at bedside. 

## 2012-05-10 ENCOUNTER — Ambulatory Visit (INDEPENDENT_AMBULATORY_CARE_PROVIDER_SITE_OTHER): Payer: Medicare Other | Admitting: *Deleted

## 2012-05-10 DIAGNOSIS — I4891 Unspecified atrial fibrillation: Secondary | ICD-10-CM

## 2012-05-10 LAB — TSH: TSH: 1.24 u[IU]/mL (ref 0.35–5.50)

## 2012-05-10 LAB — BASIC METABOLIC PANEL
BUN: 18 mg/dL (ref 6–23)
Calcium: 9.1 mg/dL (ref 8.4–10.5)
Creatinine, Ser: 0.9 mg/dL (ref 0.4–1.2)
GFR: 66.92 mL/min (ref >60.00)

## 2012-05-13 ENCOUNTER — Emergency Department (HOSPITAL_COMMUNITY): Payer: Medicare Other

## 2012-05-13 ENCOUNTER — Emergency Department (HOSPITAL_COMMUNITY)
Admission: EM | Admit: 2012-05-13 | Discharge: 2012-05-13 | Disposition: A | Discharge status: Home / Self Care | Payer: Medicare Other | Attending: Emergency Medicine | Admitting: Emergency Medicine

## 2012-05-13 ENCOUNTER — Encounter (HOSPITAL_COMMUNITY): Payer: Self-pay | Admitting: Cardiology

## 2012-05-13 DIAGNOSIS — G4733 Obstructive sleep apnea (adult) (pediatric): Secondary | ICD-10-CM | POA: Insufficient documentation

## 2012-05-13 DIAGNOSIS — I10 Essential (primary) hypertension: Secondary | ICD-10-CM | POA: Insufficient documentation

## 2012-05-13 DIAGNOSIS — I480 Paroxysmal atrial fibrillation: Secondary | ICD-10-CM | POA: Diagnosis present

## 2012-05-13 DIAGNOSIS — M109 Gout, unspecified: Secondary | ICD-10-CM | POA: Insufficient documentation

## 2012-05-13 DIAGNOSIS — M129 Arthropathy, unspecified: Secondary | ICD-10-CM | POA: Insufficient documentation

## 2012-05-13 DIAGNOSIS — E119 Type 2 diabetes mellitus without complications: Secondary | ICD-10-CM | POA: Insufficient documentation

## 2012-05-13 DIAGNOSIS — E785 Hyperlipidemia, unspecified: Secondary | ICD-10-CM | POA: Insufficient documentation

## 2012-05-13 DIAGNOSIS — I4891 Unspecified atrial fibrillation: Secondary | ICD-10-CM | POA: Insufficient documentation

## 2012-05-13 LAB — CBC WITH DIFFERENTIAL/PLATELET
Basophils Absolute: 0.1 10*3/uL (ref 0.0–0.1)
Basophils Relative: 1 % (ref 0–1)
HCT: 40.4 % (ref 36.0–46.0)
MCHC: 32.9 g/dL (ref 30.0–36.0)
Monocytes Absolute: 1.4 10*3/uL — ABNORMAL HIGH (ref 0.1–1.0)
Neutro Abs: 7 10*3/uL (ref 1.7–7.7)
Neutrophils Relative %: 63 % (ref 43–77)
Platelets: 303 10*3/uL (ref 150–400)
RDW: 14.5 % (ref 11.5–15.5)
WBC: 11.1 10*3/uL — ABNORMAL HIGH (ref 4.0–10.5)

## 2012-05-13 LAB — PROTIME-INR
INR: 2.46 — ABNORMAL HIGH (ref 0.00–1.49)
Prothrombin Time: 27.1 seconds — ABNORMAL HIGH (ref 11.6–15.2)

## 2012-05-13 LAB — COMPREHENSIVE METABOLIC PANEL
ALT: 11 U/L (ref 0–35)
AST: 13 U/L (ref 0–37)
Albumin: 3.6 g/dL (ref 3.5–5.2)
Chloride: 101 mEq/L (ref 96–112)
Creatinine, Ser: 0.72 mg/dL (ref 0.50–1.10)
Sodium: 137 mEq/L (ref 135–145)
Total Bilirubin: 0.3 mg/dL (ref 0.3–1.2)

## 2012-05-13 LAB — URINALYSIS, ROUTINE W REFLEX MICROSCOPIC
Bilirubin Urine: NEGATIVE
Ketones, ur: NEGATIVE mg/dL
Nitrite: NEGATIVE
Specific Gravity, Urine: 1.009 (ref 1.005–1.030)
Urobilinogen, UA: 0.2 mg/dL (ref 0.0–1.0)

## 2012-05-13 LAB — CARDIAC PANEL(CRET KIN+CKTOT+MB+TROPI)
CK, MB: 2 ng/mL (ref 0.3–4.0)
Troponin I: <0.3 ng/mL (ref <0.30)

## 2012-05-13 MED ORDER — AMIODARONE HCL 200 MG PO TABS: 200.0000 mg | ORAL_TABLET | Freq: Two times a day (BID) | ORAL | Status: DC

## 2012-05-13 NOTE — Consult Note (Signed)
CARDIOLOGY CONSULT NOTE   Patient ID: [name] Soohoo MRN: [id number] DOB/AGE: 01/07/1941 71 y.o.  Admit date: 05/13/2012  Primary Physician   Norberto Sorenson, MD Primary Cardiologist   SK Reason for Consultation   Palpitations   [name] is a 71 y.o. female with a history of atrial fibrillation. This morning she woke with palpitations and shortness of breath. By ER M.D. report, she was in atrial fibrillation with rapid ventricular response on EMS arrival but converted spontaneously to sinus rhythm prior to arrival in the emergency room. She had similar symptoms 4 days ago and came to the emergency room then as well. She does not get frequent palpitations but when she has palpitations, she is symptomatic. She did not have chest pain, and currently she is maintaining sinus rhythm, asymptomatic.   Past Medical History  Diagnosis Date  . Atrial fibrillation /flutter   . Hypertension   . Diabetes mellitus   . Hyperlipidemia   . Gout   . 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 Diastolic CHF, chronic     echo 2006 - ef 55-65%; mild diast dysfxn  . Arthritis   . Morbid obesity   . OSA (obstructive sleep apnea)     uses cpap  . CHF (congestive heart failure)   . Ejection fraction     EF 60%, echo, September 19, 2011  . Back pain      Past Surgical History  Procedure Date  . Cholecystectomy   . Total abdominal hysterectomy   . Appendectomy   . Tonsillectomy 1982  . Cardioversion 10/22/2011    Procedure: CARDIOVERSION;  Surgeon: Duke Salvia, MD;  Location: [facility] OR;  Service: Cardiovascular;  Laterality: N/A;    No Known Allergies  I have reviewed the patient's current medications Medication Sig  amiodarone (PACERONE) 200 MG tablet Take 200 mg by mouth See admin instructions. Five days weekly, Monday -Friday  amLODipine (NORVASC) 5 MG tablet Take one tablet by mouth twice daily.  cloNIDine (CATAPRES) 0.2 MG tablet Take 1 tablet (0.2 mg total) by  mouth 2 (two) times daily.  furosemide (LASIX) 40 MG tablet Take 1 tablet (40 mg total) by mouth daily.  levothyroxine (SYNTHROID, LEVOTHROID) 100 MCG tablet Take 1 tablet (100 mcg total) by mouth daily.  lisinopril (PRINIVIL,ZESTRIL) 40 MG tablet Take 40 mg by mouth daily.    loperamide (IMODIUM) 2 MG capsule Take 2 mg by mouth 4 (four) times daily as needed. For diarrhea  potassium chloride (K-DUR,KLOR-CON) 10 MEQ tablet Take two tablets by mouth daily  pravastatin (PRAVACHOL) 80 MG tablet Take 80 mg by mouth daily.   spironolactone (ALDACTONE) 25 MG tablet Take 25 mg by mouth daily.   warfarin (COUMADIN) 5 MG tablet Take 5 mg by mouth daily. Marland Kitchen     History   Social History  . Marital Status: Married    Spouse Name: N/A    Number of Children: Y  . Years of Education: N/A   Occupational History  . DISABILITY/housewife    Social History Main Topics  . Smoking status: Never Smoker   . Smokeless tobacco: Never Used  . Alcohol Use: No  . Drug Use: No  . Sexually Active: Yes   Other Topics Concern  . Not on file   Social History Narrative  . No narrative on file     Family History  Problem Relation Age of Onset  . Heart disease Father   . Hypertension Father   .  Breast cancer Sister   . Cancer Sister     breast     ROS: Patient generally feels well and has no acute illnesses or problems. Full 14 point review of systems complete and found to be negative unless listed above.  Physical Exam: Blood pressure 146/67, pulse 81, temperature 97.7 F (36.5 C), temperature source Oral, resp. rate 16, SpO2 96.00%.  General: Well developed, well nourished, female in no acute distress Head: Eyes PERRLA, No xanthomas.   Normocephalic and atraumatic, oropharynx without edema or exudate. Dentition: Poor Lungs: Few rales in the bases but generally clear to auscultation bilaterally. Heart: HRRR S1 S2, no rub/gallop, soft systolic  Murmur at the left upper sternal border. pulses are 2+  all 4 extrem.   Neck: No carotid bruits. No lymphadenopathy.  JVD not elevated. Abdomen: Bowel sounds present, abdomen soft and non-tender without masses or hernias noted. Msk:  No spine or cva tenderness. No weakness, no joint deformities or effusions. Extremities: No clubbing or cyanosis. No edema.  Neuro: Alert and oriented X 3. No focal deficits noted. Psych:  Good affect, responds appropriately Skin: No rashes or lesions noted.  Labs:   Lab Results  Component Value Date   WBC 11.1* 05/13/2012   HGB 13.3 05/13/2012   HCT 40.4 05/13/2012   MCV 85.1 05/13/2012   PLT 303 05/13/2012    Basename 05/13/12 0919  INR 2.46*    Lab 05/13/12 0919  NA 137  K 4.0  CL 101  CO2 24  BUN 17  CREATININE 0.72  CALCIUM 9.3  PROT 7.5  BILITOT 0.3  ALKPHOS 90  ALT 11  AST 13  GLUCOSE 141*    Basename 05/13/12 0919  CKTOTAL 82  CKMB 2.0  TROPONINI <0.30   TSH  Date/Time Value Range Status  05/10/2012 10:23 AM 1.24  0.35 - 5.50 uIU/mL Final    Echo: 09/19/2011 Study Conclusions Left ventricle: The cavity size was normal. Wall thickness was increased in a pattern of mild LVH. The estimated ejection fraction was 60%. Regional wall motion abnormalities cannot be excluded.    ECG:  13-May-2012 08:50:04  SINUS RHYTHM ~ normal P axis, V-rate 50- 99 Standard 12 Lead Report ~ Unconfirmed Interpretation Normal ECG 85mm/s 60mm/mV 150Hz  8.0.1 12SL 235 CID: [id number] Referred by: Unconfirmed Vent. rate 73 BPM PR interval 188 ms QRS duration 84 ms QT/QTc 420/463 ms P-R-T axes 56 31 44  Radiology:  Dg Chest 2 View 05/13/2012  *RADIOLOGY REPORT*  Clinical Data: Tachycardia.  CHEST - 2 VIEW  Comparison: 05/09/2012.  Findings: Mild cardiomegaly.  Pulmonary vascular congestion is present.  No focal airspace disease.  No effusion.  Subsegmental atelectasis in the right middle lobe.  IMPRESSION: Cardiomegaly and pulmonary vascular congestion.  Original Report Authenticated By: Andreas Newport, M.D.     ASSESSMENT AND PLAN:   The patient was seen today by Dr Clifton James, the patient evaluated and the data reviewed.  1. paroxysmal atrial fibrillation: The patient spontaneously converted to sinus rhythm. She is currently taking her amiodarone 5 times a week as Dr. Graciela Husbands instructed. M.D. evaluate and consider increasing the amiodarone to 40 mg daily for a week and then 200 mg daily. She will follow up with Dr Graciela Husbands on August  29 th at 08:45.   2. chronic diastolic CHF: Patient has some vascular congestion on her chest x-ray but denies shortness of breath when in sinus rhythm and O2 saturations are normal on room air. Ambulate, check shortness of breath and O2  saturation with ambulation. She has not had am medications which include Lasix 40 mg.  Plan: The patient remained stable, possible DC later today with outpatient followup arranged.  Signed: Theodore Demark 05/13/2012, 10:50 AM Co-Sign MD   I have personally seen and examined this patient with Theodore Demark, PA-C. I agree with the assessment and plan as outlined above. She is in sinus now but was in atrial fib when EMS arrived. She has a history of atrial fib and has been on multiple agents over last few years. She has most recently been on amiodarone 200 mg po 5 days per week (M-F). Several episodes of a. Fib lately. Will increase Amiodarone to 200 mg po BID and have her seen in office with Dr. Graciela Husbands or Nehemiah Settle in 1-2 weeks. She does not report issues with higher dose of amiodarone earlier this year. D/C home from ED today. Call with change in status. She is anticoagulated with coumadin.   Chakita Mcgraw 12:28 PM 05/13/2012

## 2012-05-13 NOTE — ED Notes (Signed)
Pt to department via EMS from home- pt reports feeling palpations that started this morning. EMS reports HR-140 on arrival BP- 140 palp. Denies any pain, now NR of 74. Hx of a-fib. No distress noted on arrival.

## 2012-05-13 NOTE — ED Notes (Signed)
Patient stated that she feels like her heart back to normal. Patient NSR on the monitor

## 2012-05-13 NOTE — ED Provider Notes (Signed)
History     CSN: [id number]  Arrival date & time 05/13/12  4034   First MD Initiated Contact with Patient 05/13/12 [phone]      Chief Complaint  Patient presents with  . Irregular Heart Beat    (Consider location/radiation/quality/duration/timing/severity/associated sxs/prior treatment) HPI Comments: Patient arrives by EMS from home with palpitations that started upon waking this morning. She has a history of intermittent nature fibrillation and is on amiodarone, Norvasc and Coumadin. She denies any chest pain but has some shortness of breath is now resolved. Normal sinus on arrival though was A. fib with RVR for EMS. She feels back to her baseline now. She had a similar ER visit 4 days ago for the same.  The history is provided by the patient and the EMS personnel.    Past Medical History  Diagnosis Date  . Atrial fibrillation /flutter   . Hypertension   . Diabetes mellitus   . Hyperlipidemia   . Gout   . 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 Diastolic CHF, chronic     echo 2006 - ef 55-65%; mild diast dysfxn  . Arthritis   . Morbid obesity   . OSA (obstructive sleep apnea)     uses cpap  . CHF (congestive heart failure)   . Ejection fraction     EF 60%, echo, September 19, 2011  . Back pain     Past Surgical History  Procedure Date  . Cholecystectomy   . Total abdominal hysterectomy   . Appendectomy   . Tonsillectomy 1982  . Cardioversion 10/22/2011    Procedure: CARDIOVERSION;  Surgeon: Duke Salvia, MD;  Location: [facility] OR;  Service: Cardiovascular;  Laterality: N/A;    Family History  Problem Relation Age of Onset  . Heart disease Father   . Hypertension Father   . Breast cancer Sister   . Cancer Sister     breast    History  Substance Use Topics  . Smoking status: Never Smoker   . Smokeless tobacco: Never Used  . Alcohol Use: No    OB History    Grav Para Term Preterm Abortions TAB SAB Ect Mult Living                   Review of Systems  Constitutional: Negative for fever, activity change and appetite change.  HENT: Negative for congestion and rhinorrhea.   Respiratory: Positive for shortness of breath. Negative for cough and chest tightness.   Cardiovascular: Negative for chest pain.  Gastrointestinal: Negative for nausea, vomiting and abdominal pain.  Genitourinary: Negative for dysuria and hematuria.  Musculoskeletal: Negative for back pain.  Skin: Negative for rash.  Neurological: Negative for dizziness, weakness and headaches.    Allergies  Review of patient's allergies indicates no known allergies.  Home Medications   Current Outpatient Rx  Name Route Sig Dispense Refill  . AMLODIPINE BESYLATE 5 MG PO TABS  Take one tablet by mouth twice daily. 60 tablet 6  . CLONIDINE HCL 0.2 MG PO TABS Oral Take 1 tablet (0.2 mg total) by mouth 2 (two) times daily. 60 tablet 6  . FUROSEMIDE 40 MG PO TABS Oral Take 1 tablet (40 mg total) by mouth daily. 30 tablet 6  . LEVOTHYROXINE SODIUM 100 MCG PO TABS Oral Take 1 tablet (100 mcg total) by mouth daily. 90 tablet 3  . LISINOPRIL 40 MG PO TABS Oral Take 40 mg by mouth daily.      Marland Kitchen  LOPERAMIDE HCL 2 MG PO CAPS Oral Take 2 mg by mouth 4 (four) times daily as needed. For diarrhea    . POTASSIUM CHLORIDE CRYS ER 10 MEQ PO TBCR  Take two tablets by mouth daily    . PRAVASTATIN SODIUM 80 MG PO TABS Oral Take 80 mg by mouth daily.     Marland Kitchen SPIRONOLACTONE 25 MG PO TABS Oral Take 25 mg by mouth daily.     . WARFARIN SODIUM 5 MG PO TABS Oral Take 5 mg by mouth daily. .    . AMIODARONE HCL 200 MG PO TABS Oral Take 1 tablet (200 mg total) by mouth 2 (two) times daily. Five days weekly, Monday -Friday 60 tablet 11    BP 155/71  Pulse 63  Temp 98.1 F (36.7 C) (Oral)  Resp 16  SpO2 98%  Physical Exam  Constitutional: She is oriented to person, place, and time. She appears well-developed and well-nourished. No distress.  HENT:  Head: Normocephalic and  atraumatic.  Mouth/Throat: Oropharynx is clear and moist. No oropharyngeal exudate.  Eyes: Conjunctivae are normal. Pupils are equal, round, and reactive to light.  Neck: Normal range of motion. Neck supple.  Cardiovascular: Normal rate, regular rhythm and normal heart sounds.   No murmur heard. Pulmonary/Chest: Effort normal and breath sounds normal. No respiratory distress.  Abdominal: Soft. There is no tenderness. There is no rebound and no guarding.  Musculoskeletal: Normal range of motion. She exhibits no edema and no tenderness.  Neurological: She is alert and oriented to person, place, and time. No cranial nerve deficit.  Skin: Skin is warm.    ED Course  Procedures (including critical care time)  Labs Reviewed  CBC WITH DIFFERENTIAL - Abnormal; Notable for the following:    WBC 11.1 (*)     Monocytes Absolute 1.4 (*)     All other components within normal limits  COMPREHENSIVE METABOLIC PANEL - Abnormal; Notable for the following:    Glucose, Bld 141 (*)     GFR calc non Af Amer 86 (*)     All other components within normal limits  PROTIME-INR - Abnormal; Notable for the following:    Prothrombin Time 27.1 (*)     INR 2.46 (*)     All other components within normal limits  CARDIAC PANEL(CRET KIN+CKTOT+MB+TROPI)  URINALYSIS, ROUTINE W REFLEX MICROSCOPIC   Dg Chest 2 View  05/13/2012  *RADIOLOGY REPORT*  Clinical Data: Tachycardia.  CHEST - 2 VIEW  Comparison: 05/09/2012.  Findings: Mild cardiomegaly.  Pulmonary vascular congestion is present.  No focal airspace disease.  No effusion.  Subsegmental atelectasis in the right middle lobe.  IMPRESSION: Cardiomegaly and pulmonary vascular congestion.  Original Report Authenticated By: Andreas Newport, M.D.     1. Atrial fibrillation       MDM  Palpitations from home, now resolved. History of intermittent atrial fibrillation. Visit 4 days ago for same. No chest pain or shortness of breath. Normal sinus rhythm now,  asymptomatic.   Patient remains in sinus rhythm. Case discussed with cardiology. They are increasing her amiodarone to 200 mg twice daily. She will need followup with Dr. Graciela Husbands this week.    Date: 05/13/2012  Rate: 73  Rhythm: normal sinus rhythm  QRS Axis: normal  Intervals: normal  ST/T Wave abnormalities: normal  Conduction Disutrbances:none  Narrative Interpretation:   Old EKG Reviewed: unchanged    Date: 05/13/2012  Rate: 57  Rhythm: normal sinus rhythm  QRS Axis: normal  Intervals: normal  ST/T Wave abnormalities: normal  Conduction Disutrbances:none  Narrative Interpretation:   Old EKG Reviewed: unchanged    Glynn Octave, MD 05/13/12 1556

## 2012-05-13 NOTE — ED Notes (Signed)
Patient is ambulatory without any problems. 

## 2012-05-19 ENCOUNTER — Encounter: Payer: Medicare Other | Admitting: Nurse Practitioner

## 2012-05-19 ENCOUNTER — Encounter: Payer: Medicare Other | Admitting: Cardiology

## 2012-05-21 ENCOUNTER — Telehealth: Payer: Self-pay | Admitting: Internal Medicine

## 2012-05-21 NOTE — Telephone Encounter (Signed)
Pt wants a sooner appt than the 8th because she had a-fib yesterday

## 2012-05-21 NOTE — Telephone Encounter (Signed)
Done/ set with Tereso Newcomer PA, Lela assisted app.

## 2012-05-25 ENCOUNTER — Encounter: Payer: Self-pay | Admitting: Physician Assistant

## 2012-05-25 ENCOUNTER — Ambulatory Visit: Payer: Medicare Other | Admitting: Physician Assistant

## 2012-05-25 ENCOUNTER — Ambulatory Visit (INDEPENDENT_AMBULATORY_CARE_PROVIDER_SITE_OTHER): Payer: Medicare Other | Admitting: Physician Assistant

## 2012-05-25 VITALS — BP 130/70 | HR 71 | Ht 61.0 in | Wt 259.4 lb

## 2012-05-25 DIAGNOSIS — I10 Essential (primary) hypertension: Secondary | ICD-10-CM

## 2012-05-25 DIAGNOSIS — I5032 Chronic diastolic (congestive) heart failure: Secondary | ICD-10-CM

## 2012-05-25 DIAGNOSIS — I4891 Unspecified atrial fibrillation: Secondary | ICD-10-CM

## 2012-05-25 LAB — BASIC METABOLIC PANEL
BUN: 26 mg/dL — ABNORMAL HIGH (ref 6–23)
Creatinine, Ser: 0.9 mg/dL (ref 0.4–1.2)
GFR: 66.91 mL/min (ref >60.00)

## 2012-05-25 MED ORDER — AMIODARONE HCL 200 MG PO TABS: ORAL_TABLET | ORAL | Status: DC

## 2012-05-25 NOTE — Progress Notes (Signed)
[address] [place], [place]  [address] Phone: [phone] Fax:  [phone]  Date:  05/25/2012   Name:  [name]   DOB:  23-Dec-1940   MRN:  [id number]  PCP:  Norberto Sorenson, MD  Primary Cardiologist/Primary Electrophysiologist:  Dr. Sherryl Manges    History of Present Illness: [name] is a 71 y.o. female who returns for followup after a visit to the emergency room.  She has a history of atrial fibrillation/flutter, HTN, DM 2, HL, diastolic CHF, sleep apnea. She has undergone cardioversion in the past.  She has failed flecainide and dofetilide in the past.  She was placed on amiodarone earlier this year.  Renal arterial Dopplers 12/2011: 1-59% right renal artery stenosis.  Echo 08/2011: Mild LVH, EF 60%.  She was seen in the emergency room 05/13/12 by Dr. Verne Carrow or palpitations. She apparently had atrial fibrillation with rapid ventricular response that converted spontaneously to sinus rhythm recorded by EMS en route to the emergency room. Her amiodarone was increased to 200 mg twice daily until followup.  She's had 2-3 episodes of rapid palpitations since her visit to the emergency room. Overall, the frequency is less. She denies chest pain, shortness of breath, syncope, orthopnea, PND or significant pedal edema.  Wt Readings from Last 3 Encounters:  05/25/12 259 lb 6.4 oz (117.663 kg)  05/09/12 262 lb (118.842 kg)  04/15/12 254 lb (115.214 kg)     Past Medical History  Diagnosis Date  . Atrial fibrillation /flutter   . Hypertension   . Diabetes mellitus   . Hyperlipidemia   . Gout   . 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 Diastolic CHF, chronic     echo 2006 - ef 55-65%; mild diast dysfxn  . Arthritis   . Morbid obesity   . OSA (obstructive sleep apnea)     uses cpap  . CHF (congestive heart failure)   . Ejection fraction     EF 60%, echo, September 19, 2011  . Back pain     Current Outpatient  Prescriptions  Medication Sig Dispense Refill  . amiodarone (PACERONE) 200 MG tablet Take 1 tablet (200 mg total) by mouth 2 (two) times daily. Five days weekly, Monday -Friday  60 tablet  11  . amLODipine (NORVASC) 5 MG tablet Take one tablet by mouth twice daily.  60 tablet  6  . cloNIDine (CATAPRES) 0.2 MG tablet Take 1 tablet (0.2 mg total) by mouth 2 (two) times daily.  60 tablet  6  . furosemide (LASIX) 40 MG tablet Take 1 tablet (40 mg total) by mouth daily.  30 tablet  6  . levothyroxine (SYNTHROID, LEVOTHROID) 100 MCG tablet Take 1 tablet (100 mcg total) by mouth daily.  90 tablet  3  . lisinopril (PRINIVIL,ZESTRIL) 40 MG tablet Take 40 mg by mouth daily.        Marland Kitchen loperamide (IMODIUM) 2 MG capsule Take 2 mg by mouth 4 (four) times daily as needed. For diarrhea      . potassium chloride (K-DUR,KLOR-CON) 10 MEQ tablet Take two tablets by mouth daily      . pravastatin (PRAVACHOL) 80 MG tablet Take 80 mg by mouth daily.       Marland Kitchen spironolactone (ALDACTONE) 25 MG tablet Take 25 mg by mouth daily.       Marland Kitchen warfarin (COUMADIN) 5 MG tablet Take 5 mg by mouth daily. Marland Kitchen  Allergies: No Known Allergies  History  Substance Use Topics  . Smoking status: Never Smoker   . Smokeless tobacco: Never Used  . Alcohol Use: No     ROS:  Please see the history of present illness.     All other systems reviewed and negative.   PHYSICAL EXAM: VS:  BP 130/70  Ht 5\' 1"  (1.549 m)  Wt 259 lb 6.4 oz (117.663 kg)  BMI 49.01 kg/m2 Well nourished, well developed, in no acute distress HEENT: normal Neck: no JVD Cardiac:  normal S1, S2; RRR; no murmur Lungs:  clear to auscultation bilaterally, no wheezing, rhonchi or rales Abd: soft, nontender, no hepatomegaly Ext: Trace bilateral LE edema Skin: warm and dry Neuro:  CNs 2-12 intact, no focal abnormalities noted  EKG:  Sinus rhythm, heart rate 71, normal axis, nonspecific ST-T wave changes, QTc 471 ms      ASSESSMENT AND PLAN:  1. Atrial  Fibrillation Maintaining sinus rhythm. She has had symptoms that are fairly consistent with paroxysmal atrial fibrillation. Overall, her symptoms have decreased in frequency. I have asked her to increase her amiodarone to 400 mg in the morning and 200 mg in the evening for one week. She will then reduce her dose to 400 mg daily. She will continue on this until she sees Dr. Graciela Husbands in followup. She has an appointment later this month.  TSH  Date/Time Value Range Status  05/10/2012 10:23 AM 1.24  0.35 - 5.50 uIU/mL Final   AST  Date/Time Value Range Status  05/13/2012  9:19 AM 13  0 - 37 U/L Final   ALT  Date/Time Value Range Status  05/13/2012  9:19 AM 11  0 - 35 U/L Final   2. Hypertension Controlled. She has not been taking her potassium supplementation for over 2 weeks now. Repeat a basic metabolic panel today.  3. Chronic Diastolic CHF Volume stable. Continue current therapy.  SignedTereso Newcomer, PA-C  10:05 AM 05/25/2012

## 2012-05-25 NOTE — Patient Instructions (Addendum)
Your physician has recommended you make the following change in your medication: Take 400 mg of amiodarone in the morning and 200 mg of amiodarone in the evening for 1 week then decrease to 400 mg in the morning only  Your physician recommends that you return for lab work in: bmet  Keep appointment with Dr. Graciela Husbands on June 17, 2012

## 2012-05-26 ENCOUNTER — Encounter: Payer: Medicare Other | Admitting: Nurse Practitioner

## 2012-05-27 ENCOUNTER — Encounter: Payer: Medicare Other | Admitting: Nurse Practitioner

## 2012-05-28 ENCOUNTER — Telehealth: Payer: Self-pay | Admitting: Cardiovascular Disease

## 2012-05-28 ENCOUNTER — Telehealth: Payer: Self-pay | Admitting: Internal Medicine

## 2012-05-28 NOTE — Telephone Encounter (Signed)
I left a message for the patient to call. 

## 2012-05-28 NOTE — Telephone Encounter (Signed)
Please return call to patient at [phone] concerning dental appnt, she has questions.

## 2012-05-28 NOTE — Telephone Encounter (Signed)
Error

## 2012-05-31 ENCOUNTER — Ambulatory Visit: Payer: Medicare Other | Admitting: Physician Assistant

## 2012-06-01 NOTE — Telephone Encounter (Signed)
I spoke with the patient. She wanted to know if she needed to hold her coumadin for a tooth extraction. I explained she would need to clarify with her dentist if he required that she hold coumadin prior to extraction. She will let us know after she clarifies to see if Dr. Graciela Husbands needs to give an ok to hold this.

## 2012-06-03 ENCOUNTER — Ambulatory Visit: Payer: Medicare Other | Admitting: Physical Medicine & Rehabilitation

## 2012-06-03 ENCOUNTER — Encounter: Payer: Medicare Other | Attending: Physical Medicine & Rehabilitation

## 2012-06-09 ENCOUNTER — Ambulatory Visit (INDEPENDENT_AMBULATORY_CARE_PROVIDER_SITE_OTHER): Payer: Medicare Other | Admitting: Pharmacist

## 2012-06-09 DIAGNOSIS — Z7901 Long term (current) use of anticoagulants: Secondary | ICD-10-CM

## 2012-06-09 DIAGNOSIS — I4891 Unspecified atrial fibrillation: Secondary | ICD-10-CM

## 2012-06-09 DIAGNOSIS — I4892 Unspecified atrial flutter: Secondary | ICD-10-CM

## 2012-06-09 LAB — POCT INR: INR: 3.2

## 2012-06-17 ENCOUNTER — Ambulatory Visit (INDEPENDENT_AMBULATORY_CARE_PROVIDER_SITE_OTHER): Payer: Medicare Other | Admitting: Internal Medicine

## 2012-06-17 ENCOUNTER — Encounter: Payer: Self-pay | Admitting: Internal Medicine

## 2012-06-17 VITALS — BP 121/76 | HR 69 | Ht 61.0 in | Wt 261.0 lb

## 2012-06-17 DIAGNOSIS — I5032 Chronic diastolic (congestive) heart failure: Secondary | ICD-10-CM

## 2012-06-17 DIAGNOSIS — I10 Essential (primary) hypertension: Secondary | ICD-10-CM

## 2012-06-17 DIAGNOSIS — I4891 Unspecified atrial fibrillation: Secondary | ICD-10-CM

## 2012-06-17 NOTE — Assessment & Plan Note (Signed)
Euvolemic e

## 2012-06-17 NOTE — Assessment & Plan Note (Signed)
Well-controlled; we'll continue her medicines

## 2012-06-17 NOTE — Assessment & Plan Note (Signed)
Currently doing pretty well on her amiodarone dose we will continue her on 200 mg twice daily.

## 2012-06-17 NOTE — Progress Notes (Signed)
HPI  [name] is a 71 y.o. female Seen in followup for atrial arrhythmias associated with diastolic heart failure. She's been tried on flecainide, dofetilide, and most recently has been on amiodarone.  Echocardiogram November 2012 demonstrated normal left ventricular function with mild LVH; atrial chambers were also normal in size   TSH 1.2 <11 6/13 <<22<<31  She is intercurrently been seen in the hospital for tachypalpitations and was found to have atrial fibrillation which terminated spontaneously her amiodarone dose was increased to 200 twice daily  She is stable     Past Medical History  Diagnosis Date  . Atrial fibrillation /flutter   . Hypertension   . Diabetes mellitus   . Hyperlipidemia   . Gout   . 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 Diastolic CHF, chronic     echo 2006 - ef 55-65%; mild diast dysfxn  . Arthritis   . Morbid obesity   . OSA (obstructive sleep apnea)     uses cpap  . CHF (congestive heart failure)   . Ejection fraction     EF 60%, echo, September 19, 2011  . Back pain     Past Surgical History  Procedure Date  . Cholecystectomy   . Total abdominal hysterectomy   . Appendectomy   . Tonsillectomy 1982  . Cardioversion 10/22/2011    Procedure: CARDIOVERSION;  Surgeon: Duke Salvia, MD;  Location: [facility] OR;  Service: Cardiovascular;  Laterality: N/A;    Current Outpatient Prescriptions  Medication Sig Dispense Refill  . amiodarone (PACERONE) 200 MG tablet Take 400mg  in the morning and 200mg  in the evening for 1 week then decrease to 400mg  in the morning only  90 tablet  11  . amLODipine (NORVASC) 5 MG tablet Take one tablet by mouth twice daily.  60 tablet  6  . cloNIDine (CATAPRES) 0.2 MG tablet Take 1 tablet (0.2 mg total) by mouth 2 (two) times daily.  60 tablet  6  . furosemide (LASIX) 40 MG tablet Take 1 tablet (40 mg total) by mouth daily.  30 tablet  6  . levothyroxine (SYNTHROID, LEVOTHROID) 100 MCG tablet  Take 1 tablet (100 mcg total) by mouth daily.  90 tablet  3  . lisinopril (PRINIVIL,ZESTRIL) 40 MG tablet Take 40 mg by mouth daily.        Marland Kitchen loperamide (IMODIUM) 2 MG capsule Take 2 mg by mouth 4 (four) times daily as needed. For diarrhea      . pravastatin (PRAVACHOL) 80 MG tablet Take 80 mg by mouth daily.       Marland Kitchen spironolactone (ALDACTONE) 25 MG tablet Take 25 mg by mouth daily.       Marland Kitchen warfarin (COUMADIN) 5 MG tablet Take 5 mg by mouth daily. Marland Kitchen        No Known Allergies  Review of Systems negative except from HPI and PMH  Physical Exam BP 121/76  Pulse 69  Ht 5\' 1"  (1.549 m)  Wt 261 lb (118.389 kg)  BMI 49.32 kg/m2 Well developed and well nourished mobribkidly obese in no acute distress HENT normal E scleral and icterus clear Neck Supple JVP flat; carotids brisk and full Clear to ausculation Regular rate and rhythm, no murmurs gallops or rub Soft with active bowel sounds No clubbing cyanosis nio Edema Alert and oriented, grossly normal motor and sensory function Skin Warm and Dry  ECG sinus at  at 60 Intervals 20/08/44 Axis is 9  Assessment and  Plan

## 2012-06-17 NOTE — Patient Instructions (Signed)
Your physician recommends that you schedule a follow-up appointment in: 3 months with Dr. Klein.  Your physician recommends that you continue on your current medications as directed. Please refer to the Current Medication list given to you today.   

## 2012-06-25 ENCOUNTER — Ambulatory Visit: Payer: Medicare Other | Admitting: Physical Medicine & Rehabilitation

## 2012-06-29 ENCOUNTER — Encounter: Payer: Medicare Other | Attending: Physical Medicine & Rehabilitation

## 2012-06-29 ENCOUNTER — Ambulatory Visit: Payer: Medicare Other | Admitting: Physical Medicine & Rehabilitation

## 2012-06-30 ENCOUNTER — Ambulatory Visit (INDEPENDENT_AMBULATORY_CARE_PROVIDER_SITE_OTHER): Payer: Medicare Other | Admitting: *Deleted

## 2012-06-30 DIAGNOSIS — I4891 Unspecified atrial fibrillation: Secondary | ICD-10-CM

## 2012-06-30 DIAGNOSIS — Z7901 Long term (current) use of anticoagulants: Secondary | ICD-10-CM

## 2012-06-30 DIAGNOSIS — I4892 Unspecified atrial flutter: Secondary | ICD-10-CM

## 2012-07-12 ENCOUNTER — Ambulatory Visit: Payer: Medicare Other | Admitting: Pulmonary Disease

## 2012-07-13 ENCOUNTER — Ambulatory Visit (INDEPENDENT_AMBULATORY_CARE_PROVIDER_SITE_OTHER): Payer: Medicare Other | Admitting: Pulmonary Disease

## 2012-07-13 ENCOUNTER — Encounter: Payer: Self-pay | Admitting: Pulmonary Disease

## 2012-07-13 ENCOUNTER — Telehealth: Payer: Self-pay | Admitting: Internal Medicine

## 2012-07-13 VITALS — HR 65 | Temp 98.0°F | Ht 61.0 in | Wt 264.0 lb

## 2012-07-13 DIAGNOSIS — G4733 Obstructive sleep apnea (adult) (pediatric): Secondary | ICD-10-CM

## 2012-07-13 NOTE — Addendum Note (Signed)
Addended by: Orma Flaming D on: 07/13/2012 03:08 PM   Modules accepted: Orders

## 2012-07-13 NOTE — Telephone Encounter (Signed)
New problem:  Discuss medication.  

## 2012-07-13 NOTE — Patient Instructions (Addendum)
Continue with cpap, and keep up with mask changes and supplies. Will have your equipment company either replace your current cushion or show you different masks that may fit better? Work on weight loss followup with me in one year, but call if having issues.

## 2012-07-13 NOTE — Assessment & Plan Note (Signed)
The patient overall is doing well with CPAP, but we need to work on a mask that seals more effectively.  This may simply be a cushion issue, or she may need to consider getting a different type of mask. I have asked her to continue being compliant with the device, and work aggressively on weight loss.  She is starting nutrition classes, and this should help her with caloric intake.

## 2012-07-13 NOTE — Telephone Encounter (Signed)
I left a message for the patient to call. 

## 2012-07-13 NOTE — Progress Notes (Signed)
  Subjective:    Patient ID: [name], female    DOB: [birth year], 71 y.o.   MRN: [id number]  HPI Patient comes in today for followup of her known obstructive sleep apnea.  She is wearing CPAP compliantly, but is currently having mask issues.  It is unclear whether she is overdue for a new cushion, or whether she needs to look at a new mask because of poor fit.  She has been doing well with CPAP otherwise, and feels that she sleeps much better with improved daytime alertness.  Her weight is down 2 pounds from the last visit.   Review of Systems  Constitutional: Negative for fever, chills, diaphoresis, activity change, appetite change, fatigue and unexpected weight change.  HENT: Negative for nosebleeds, congestion, sore throat, rhinorrhea, trouble swallowing, voice change, postnasal drip and sinus pressure.   Eyes: Negative for visual disturbance.  Respiratory: Negative for choking, chest tightness, shortness of breath and wheezing.   Cardiovascular: Negative for chest pain, palpitations and leg swelling.  Gastrointestinal: Negative for nausea, vomiting and constipation.  Genitourinary: Negative for difficulty urinating.  Musculoskeletal: Negative for joint swelling.  Skin: Negative for rash.  Neurological: Negative for dizziness, speech difficulty and headaches.  Hematological: Does not bruise/bleed easily.  Psychiatric/Behavioral: Negative for agitation. The patient is not nervous/anxious.        Objective:   Physical Exam Morbidly obese female in no acute distress No skin breakdown or pressure necrosis from the CPAP mask Nose without purulence or discharge noted Lower extremities with mild edema, no cyanosis Alert, does not appear to be sleepy, moves all 4 extremities.       Assessment & Plan:

## 2012-07-14 NOTE — Telephone Encounter (Signed)
I left a message for the patient to call. 

## 2012-07-19 ENCOUNTER — Telehealth: Payer: Self-pay | Admitting: Internal Medicine

## 2012-07-19 NOTE — Telephone Encounter (Signed)
i am not aware of amio and hallucinations  Am i correct in understanding that they are better off clonidine  If she is sob, we should get an eCg to see if in afib

## 2012-07-19 NOTE — Telephone Encounter (Signed)
Per the patient, she has noticed no real change in the decreased dose of clonidine, or even when she has not taken it due to problems splitting the pill.

## 2012-07-19 NOTE — Telephone Encounter (Signed)
Pt rtn call to heather, pls call

## 2012-07-19 NOTE — Telephone Encounter (Signed)
I spoke with the patient. She states that she saw Dr. Reche Dixon last Tuesday due to some hallucinations that she is having. She describes this as seeing faces coming toward her at night. She states that Dr. Reche Dixon recommended to her that they may be coming from the clonidine and he has decreased her dose to 0.1 mg twice daily. The patient states that she has a hard time splitting these in half and has not been to get the 0.1 mg tablets yet. Since cutting her dose, she has not noticed a real change in the hallucinations. Dr. Reche Dixon wanted her to let Dr. Graciela Husbands know about these and see if he felt like amiodarone could be causing this. He also wanted her to let Dr. Graciela Husbands know she is tired walking short distances/ cooking. I advised I am not certain if the hallucinations are coming from the amiodarone, but I will review with Dr. Graciela Husbands and we will call her back. She is agreeable.

## 2012-07-19 NOTE — Telephone Encounter (Signed)
I attempted to reach the patient again on her contact number. No answer. I left a message for her to call if we can help her. I will otherwise close this encounter.

## 2012-07-26 NOTE — Telephone Encounter (Signed)
We should have her scheduled for a ECG and she is concerned still about amiodarone contributing to hallucinations please set up an office visit with me

## 2012-07-28 ENCOUNTER — Ambulatory Visit (INDEPENDENT_AMBULATORY_CARE_PROVIDER_SITE_OTHER): Payer: Medicare Other | Admitting: *Deleted

## 2012-07-28 DIAGNOSIS — I4892 Unspecified atrial flutter: Secondary | ICD-10-CM

## 2012-07-28 DIAGNOSIS — Z7901 Long term (current) use of anticoagulants: Secondary | ICD-10-CM

## 2012-07-28 DIAGNOSIS — I4891 Unspecified atrial fibrillation: Secondary | ICD-10-CM

## 2012-07-29 ENCOUNTER — Telehealth: Payer: Self-pay | Admitting: Internal Medicine

## 2012-07-29 NOTE — Telephone Encounter (Signed)
plz return call to pt at hM#  Regarding increased heart beat on exertion, No Chest pain at this time, breathing is ok. Pt also needs med dosage clarification.

## 2012-08-03 NOTE — Telephone Encounter (Signed)
Appt made to see [name] on 08/05/2012.

## 2012-08-05 ENCOUNTER — Telehealth: Payer: Self-pay | Admitting: *Deleted

## 2012-08-05 ENCOUNTER — Ambulatory Visit (INDEPENDENT_AMBULATORY_CARE_PROVIDER_SITE_OTHER): Payer: Medicare Other | Admitting: Physician Assistant

## 2012-08-05 ENCOUNTER — Encounter: Payer: Self-pay | Admitting: Physician Assistant

## 2012-08-05 VITALS — BP 144/80 | HR 67 | Ht 61.0 in | Wt 263.0 lb

## 2012-08-05 DIAGNOSIS — I5032 Chronic diastolic (congestive) heart failure: Secondary | ICD-10-CM

## 2012-08-05 DIAGNOSIS — I4891 Unspecified atrial fibrillation: Secondary | ICD-10-CM

## 2012-08-05 DIAGNOSIS — I10 Essential (primary) hypertension: Secondary | ICD-10-CM

## 2012-08-05 LAB — BASIC METABOLIC PANEL
BUN: 33 mg/dL — ABNORMAL HIGH (ref 6–23)
Calcium: 8.8 mg/dL (ref 8.4–10.5)
GFR: 48.77 mL/min — ABNORMAL LOW (ref >60.00)
Glucose, Bld: 199 mg/dL — ABNORMAL HIGH (ref 70–99)

## 2012-08-05 LAB — CBC WITH DIFFERENTIAL/PLATELET
Basophils Absolute: 0 10*3/uL (ref 0.0–0.1)
HCT: 38.4 % (ref 36.0–46.0)
Lymphocytes Relative: 16.7 % (ref 12.0–46.0)
Lymphs Abs: 1.9 10*3/uL (ref 0.7–4.0)
Monocytes Relative: 11 % (ref 3.0–12.0)
Platelets: 356 10*3/uL (ref 150.0–400.0)
RDW: 14.4 % (ref 11.5–14.6)

## 2012-08-05 MED ORDER — AMLODIPINE BESYLATE 5 MG PO TABS: 5.0000 mg | ORAL_TABLET | Freq: Every day | ORAL | Status: DC

## 2012-08-05 MED ORDER — FUROSEMIDE 40 MG PO TABS: 20.0000 mg | ORAL_TABLET | Freq: Every day | ORAL | Status: DC

## 2012-08-05 NOTE — Patient Instructions (Addendum)
Decrease Amlodipine to 5 mg take once daily in the evenings. Check BP once daily (about 3-4 times a week). After 2 weeks, if BP still less than or equal to 100 on top (systolic), call me and we can cut back further on your medications.  Your physician recommends that you return for lab work in: TODAY BMET, CBC W/DIFF  09/08/12 @ 9:45 WITH DR. Graciela Husbands

## 2012-08-05 NOTE — Telephone Encounter (Signed)
Message copied by Tarri Fuller on Thu Aug 05, 2012  3:31 PM ------      Message from: [place], [place] T      Created: Thu Aug 05, 2012  1:55 PM       Labs ok except BUN a little high (might be too dry) - recommend she decrease Lasix to 20 mg QD.      Check BMET in 2 weeks.       Tereso Newcomer, PA-C  1:54 PM 08/05/2012

## 2012-08-05 NOTE — Telephone Encounter (Signed)
pt notified about lab rsults and to decreas elasix to 20 mg daily, bmet 08/20/12

## 2012-08-05 NOTE — Progress Notes (Signed)
[address] [place], [place]  [address] Phone: [phone] Fax:  [phone]  Date:  08/05/2012   Name:  [name]   DOB:  1940/12/11   MRN:  [id number]  PCP:  [name] Creek, MD  Primary Cardiologist/Primary Electrophysiologist:  Dr. Sherryl [name]    History of Present Illness: [name] is a 71 y.o. female who returns for followup on blood pressure.  She has a history of atrial fibrillation/flutter, HTN, DM 2, HL, diastolic CHF, sleep apnea. She has undergone cardioversion in the past.  She has failed flecainide and dofetilide in the past.  She was placed on amiodarone earlier this year.    I saw her in 8/13 after a visit to the emergency room. She has had more atrial fibrillation. I had her continue to take a higher dose of amiodarone until she saw Dr. Graciela [name] in followup. She saw Dr. Graciela [name] in 06/17/12. At that time she was stable and was recommended she followup in 3 months.  Patient notes lightheadedness and low blood pressures over the last couple of months. Her blood pressure has been in the 90s to 100s at her other doctors appointments. She denies chest pain. She does have palpitations with activity. She denies any palpitations reminiscent of her atrial fibrillation. She denies orthopnea, PND or edema. She denies syncope. She has dyspnea with more extreme activities.  Labs (7/13):  ALT 11, Hgb 13.3, TSH 1.24 Labs (8/13):  K 4.5, creatinine 0.9   Wt Readings from Last 3 Encounters:  08/05/12 263 lb (119.296 kg)  07/13/12 264 lb (119.75 kg)  06/17/12 261 lb (118.389 kg)     Past Medical History  Diagnosis Date  . Atrial fibrillation /flutter   . Hypertension     a.  Renal arterial Dopplers 12/2011: 1-59% right renal artery stenosis  . Diabetes mellitus   . Hyperlipidemia   . Gout   . 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 Diastolic CHF, chronic     a.  echo 2006 - ef 55-65%; mild diast dysfxn;    b. Echo  08/2011: Mild LVH, EF 60%.   . Arthritis   . Morbid obesity   . OSA (obstructive sleep apnea)     uses cpap  . CHF (congestive heart failure)   . Back pain     Current Outpatient Prescriptions  Medication Sig Dispense Refill  . amiodarone (PACERONE) 200 MG tablet 200 mg 2 (two) times daily.      Marland Kitchen amLODipine (NORVASC) 5 MG tablet Take one tablet by mouth twice daily.  60 tablet  6  . cloNIDine (CATAPRES) 0.2 MG tablet Take 0.1 mg by mouth 2 (two) times daily.      . furosemide (LASIX) 40 MG tablet Take 1 tablet (40 mg total) by mouth daily.  30 tablet  6  . levothyroxine (SYNTHROID, LEVOTHROID) 100 MCG tablet Take 1 tablet (100 mcg total) by mouth daily.  90 tablet  3  . lisinopril (PRINIVIL,ZESTRIL) 40 MG tablet Take 40 mg by mouth daily.        . pravastatin (PRAVACHOL) 80 MG tablet Take 80 mg by mouth daily.       Marland Kitchen spironolactone (ALDACTONE) 25 MG tablet Take 25 mg by mouth daily.       Marland Kitchen warfarin (COUMADIN) 5 MG tablet Take 5 mg by mouth daily. .        Allergies: No Known Allergies  History  Substance Use  Topics  . Smoking status: Never Smoker   . Smokeless tobacco: Never Used  . Alcohol Use: No     ROS:  Please see the history of present illness.  She also notes some visual hallucinations. She denies any decreased visual acuity. She does see an eye doctor yearly. She has a nonproductive cough. She denies any melena or hematochezia or hematuria.   All other systems reviewed and negative.   PHYSICAL EXAM: VS:  BP 144/80  Pulse 67  Ht 5\' 1"  (1.549 m)  Wt 263 lb (119.296 kg)  BMI 49.69 kg/m2 Well nourished, well developed, in no acute distress HEENT: normal Neck: no JVD at 90  Cardiac:  normal S1, S2; RRR; no murmur Lungs:  clear to auscultation bilaterally, no wheezing, rhonchi or rales Abd: soft, nontender, no hepatomegaly Ext: no significant edema Skin: warm and dry Neuro:  CNs 2-12 intact, no focal abnormalities noted  EKG:   NSR, HR 67, nonspecific ST-T wave  changes, QTc 441      ASSESSMENT AND PLAN:  1. Hypertension: Her blood pressures have been running low. Her blood pressure today is without her taking her medications. I will decrease her Norvasc to 5 mg daily. She will keep an eye on her blood pressures at home. If her systolic remains 409 or less, I would suggest that we discontinue her Norvasc or decrease her clonidine to 0.1 mg twice a day. Check a CBC and basic metabolic panel today.  2. Atrial Fibrillation: She is maintaining sinus rhythm. Her atrial fibrillation seems to be quiescent with her current dose of amiodarone. I do not believe that her visual hallucinations are related to her amiodarone. She does get yearly eye exams. Continue followup with Dr. Graciela [name] as indicated next month.  3. Chronic Diastolic CHF: Volume is stable. Followup basic metabolic panel today.  Signed, Tereso Newcomer, PA-C  10:32 AM 08/05/2012

## 2012-08-06 ENCOUNTER — Other Ambulatory Visit: Payer: Self-pay | Admitting: Internal Medicine

## 2012-08-11 ENCOUNTER — Ambulatory Visit (INDEPENDENT_AMBULATORY_CARE_PROVIDER_SITE_OTHER): Payer: Medicare Other | Admitting: *Deleted

## 2012-08-11 DIAGNOSIS — I4891 Unspecified atrial fibrillation: Secondary | ICD-10-CM

## 2012-08-11 DIAGNOSIS — I4892 Unspecified atrial flutter: Secondary | ICD-10-CM

## 2012-08-11 DIAGNOSIS — Z7901 Long term (current) use of anticoagulants: Secondary | ICD-10-CM

## 2012-08-20 ENCOUNTER — Other Ambulatory Visit: Payer: Medicare Other

## 2012-08-23 ENCOUNTER — Telehealth: Payer: Self-pay | Admitting: *Deleted

## 2012-08-23 ENCOUNTER — Other Ambulatory Visit (INDEPENDENT_AMBULATORY_CARE_PROVIDER_SITE_OTHER): Payer: Medicare Other

## 2012-08-23 DIAGNOSIS — I5032 Chronic diastolic (congestive) heart failure: Secondary | ICD-10-CM

## 2012-08-23 LAB — BASIC METABOLIC PANEL
BUN: 26 mg/dL — ABNORMAL HIGH (ref 6–23)
CO2: 27 mEq/L (ref 19–32)
GFR: 53.48 mL/min — ABNORMAL LOW (ref >60.00)
Glucose, Bld: 93 mg/dL (ref 70–99)
Potassium: 4.9 mEq/L (ref 3.5–5.1)

## 2012-08-23 NOTE — Telephone Encounter (Signed)
pt notified about lab results today with verbal understanding  

## 2012-08-23 NOTE — Telephone Encounter (Signed)
Message copied by Tarri Fuller on Mon Aug 23, 2012  5:50 PM ------      Message from: [place], [place] T      Created: Mon Aug 23, 2012  5:43 PM       [facility]      Continue with current treatment plan.      Tereso Newcomer, PA-C  5:43 PM 08/23/2012

## 2012-08-23 NOTE — Telephone Encounter (Signed)
Message copied by Tarri Fuller on Mon Aug 23, 2012  5:52 PM ------      Message from: [place], [place] T      Created: Mon Aug 23, 2012  5:43 PM       [facility]      Continue with current treatment plan.      Tereso Newcomer, PA-C  5:43 PM 08/23/2012

## 2012-09-06 ENCOUNTER — Telehealth: Payer: Self-pay | Admitting: Internal Medicine

## 2012-09-06 NOTE — Telephone Encounter (Signed)
Pt has medication question, needs call back before 500p today please

## 2012-09-06 NOTE — Telephone Encounter (Signed)
Reviewed meds with pt.

## 2012-09-08 ENCOUNTER — Ambulatory Visit: Payer: Medicare Other | Admitting: Internal Medicine

## 2012-09-08 ENCOUNTER — Ambulatory Visit (INDEPENDENT_AMBULATORY_CARE_PROVIDER_SITE_OTHER): Payer: Medicare Other | Admitting: *Deleted

## 2012-09-08 DIAGNOSIS — Z7901 Long term (current) use of anticoagulants: Secondary | ICD-10-CM

## 2012-09-08 DIAGNOSIS — I4892 Unspecified atrial flutter: Secondary | ICD-10-CM

## 2012-09-08 DIAGNOSIS — I4891 Unspecified atrial fibrillation: Secondary | ICD-10-CM

## 2012-09-15 ENCOUNTER — Telehealth: Payer: Self-pay | Admitting: Internal Medicine

## 2012-09-15 NOTE — Telephone Encounter (Signed)
Patient called was told Dr.Klein not in office today will forward message to Dr.Klein for advice.

## 2012-09-15 NOTE — Telephone Encounter (Signed)
New problem:   Need an extraction of teeth. Please advise when to stop coumadin.

## 2012-09-20 ENCOUNTER — Telehealth: Payer: Self-pay | Admitting: Internal Medicine

## 2012-09-20 ENCOUNTER — Ambulatory Visit: Payer: Self-pay | Admitting: Internal Medicine

## 2012-09-20 NOTE — Telephone Encounter (Signed)
[facility] or [employer].

## 2012-09-20 NOTE — Telephone Encounter (Signed)
Pt rs appt again for today, ride cxl on her, wants appt asap, dr Graciela Husbands booked, do you want her worked in? She has gout and can't drive

## 2012-09-20 NOTE — Telephone Encounter (Signed)
Per Dr. Graciela Husbands, ok to hold coumadin for dental procedure.  Up to the dentist as to how long she needs to be off prior to procedure.  Pt notified.

## 2012-09-23 ENCOUNTER — Other Ambulatory Visit: Payer: Self-pay

## 2012-09-23 DIAGNOSIS — I4891 Unspecified atrial fibrillation: Secondary | ICD-10-CM

## 2012-09-23 MED ORDER — WARFARIN SODIUM 5 MG PO TABS: ORAL_TABLET | ORAL | Status: DC

## 2012-10-06 ENCOUNTER — Ambulatory Visit (INDEPENDENT_AMBULATORY_CARE_PROVIDER_SITE_OTHER): Payer: Medicare Other | Admitting: *Deleted

## 2012-10-06 DIAGNOSIS — Z7901 Long term (current) use of anticoagulants: Secondary | ICD-10-CM

## 2012-10-06 DIAGNOSIS — I4892 Unspecified atrial flutter: Secondary | ICD-10-CM

## 2012-10-06 DIAGNOSIS — I4891 Unspecified atrial fibrillation: Secondary | ICD-10-CM

## 2012-11-03 ENCOUNTER — Ambulatory Visit (INDEPENDENT_AMBULATORY_CARE_PROVIDER_SITE_OTHER): Payer: Medicare Other | Admitting: *Deleted

## 2012-11-03 DIAGNOSIS — Z7901 Long term (current) use of anticoagulants: Secondary | ICD-10-CM

## 2012-11-03 DIAGNOSIS — I4892 Unspecified atrial flutter: Secondary | ICD-10-CM

## 2012-11-03 DIAGNOSIS — I4891 Unspecified atrial fibrillation: Secondary | ICD-10-CM

## 2012-11-03 LAB — POCT INR: INR: 2.2

## 2012-11-11 ENCOUNTER — Ambulatory Visit (INDEPENDENT_AMBULATORY_CARE_PROVIDER_SITE_OTHER): Payer: Medicare Other | Admitting: Internal Medicine

## 2012-11-11 ENCOUNTER — Encounter: Payer: Self-pay | Admitting: Cardiology

## 2012-11-11 ENCOUNTER — Encounter: Payer: Self-pay | Admitting: Internal Medicine

## 2012-11-11 VITALS — BP 109/54 | HR 67 | Ht 61.0 in | Wt 263.0 lb

## 2012-11-11 DIAGNOSIS — R079 Chest pain, unspecified: Secondary | ICD-10-CM

## 2012-11-11 MED ORDER — AMIODARONE HCL 200 MG PO TABS: 300.0000 mg | ORAL_TABLET | Freq: Two times a day (BID) | ORAL | Status: DC

## 2012-11-11 NOTE — Assessment & Plan Note (Signed)
While the symptoms are atypical, they are new and the pt has multiple cardiac risk factors and is limited in exercise capacity  Last eval in 2007 was neg.  Will undertake lexiscan myoview

## 2012-11-11 NOTE — Assessment & Plan Note (Signed)
She continues with dyspnea on exertion. I've asked her to increase her Lasix from 20-40 mg a day for next week until she sees her PCP.

## 2012-11-11 NOTE — Progress Notes (Signed)
Patient Care Team: Mia Creek, MD as PCP - General (Internal Medicine) Sherren Mocha, MD as Resident (Family Medicine)   HPI  [name] is a 72 y.o. female  Seen in followup for atrial arrhythmias associated with diastolic heart failure. She's been tried on flecainide, dofetilide, and most recently has been on amiodarone.  Echocardiogram November 2012 demonstrated normal left ventricular function with mild LVH; atrial chambers were also normal in size  TSH 1.2 <11 6/13 <<22<<31  She is intercurrently been seen in the hospital for tachypalpitations and was found to have atrial fibrillation which terminated spontaneously.  She is on  amiodarone dose was increased to 200 twice daily  She is stable from a rhythm point of view.  She does note some discomfort in her chest which is not exertional. She does have dyspnea on exertion. She has chronic mild edema. Cardiac risk factors are notable for diabetes and hypertension. She is not very ambulatory because of her size.  Past Medical History  Diagnosis Date  . Atrial fibrillation /flutter   . Hypertension     a.  Renal arterial Dopplers 12/2011: 1-59% right renal artery stenosis  . Diabetes mellitus   . Hyperlipidemia   . Gout   . 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 Diastolic CHF, chronic     a.  echo 2006 - ef 55-65%; mild diast dysfxn;    b. Echo 08/2011: Mild LVH, EF 60%.   . Arthritis   . Morbid obesity   . OSA (obstructive sleep apnea)     uses cpap  . CHF (congestive heart failure)   . Back pain     Past Surgical History  Procedure Date  . Cholecystectomy   . Total abdominal hysterectomy   . Appendectomy   . Tonsillectomy 1982  . Cardioversion 10/22/2011    Procedure: CARDIOVERSION;  Surgeon: Duke Salvia, MD;  Location: [facility] OR;  Service: Cardiovascular;  Laterality: N/A;    Current Outpatient Prescriptions  Medication Sig Dispense Refill  . amiodarone (PACERONE) 200 MG tablet 200 mg 2  (two) times daily.      Marland Kitchen amLODipine (NORVASC) 5 MG tablet Take 1 tablet (5 mg total) by mouth daily.  30 tablet  6  . cloNIDine (CATAPRES) 0.2 MG tablet Take 0.1 mg by mouth 2 (two) times daily.      . furosemide (LASIX) 40 MG tablet Take 0.5 tablets (20 mg total) by mouth daily.      Marland Kitchen levothyroxine (SYNTHROID, LEVOTHROID) 100 MCG tablet Take 1 tablet (100 mcg total) by mouth daily.  90 tablet  3  . lisinopril (PRINIVIL,ZESTRIL) 40 MG tablet Take 40 mg by mouth daily.        . pravastatin (PRAVACHOL) 80 MG tablet Take 40 mg by mouth daily.       Marland Kitchen spironolactone (ALDACTONE) 25 MG tablet Take 25 mg by mouth daily.       Marland Kitchen warfarin (COUMADIN) 5 MG tablet Take as directed by anticoagulation clinic.  30 tablet  2    No Known Allergies  Review of Systems negative except from HPI and PMH  Physical Exam BP 109/54  Pulse 67  Ht 5\' 1"  (1.549 m)  Wt 263 lb (119.296 kg)  BMI 49.69 kg/m2 Well developed and morbidly obese female in no acute distress HENT normal E scleral and icterus clear Neck Supple JVP flat; carotids brisk and full Clear to ausculation Regular rate and rhythm, no murmurs gallops  or rub Soft with active bowel sounds No clubbing cyanosis Trace Edema Alert and oriented, grossly normal motor and sensory function Skin Warm and Dry  ECG demonstrates sinus at 63 Exline intervals 22/08/45 Axis is 11  Assessment and  Plan

## 2012-11-11 NOTE — Patient Instructions (Addendum)
Your physician has requested that you have a lexiscan myoview. For further information please visit [email or web address] Please follow instruction sheet, as given.  Increase Amiodarone to 300mg  twice daily.

## 2012-11-11 NOTE — Assessment & Plan Note (Signed)
Holding sinus rhythm on amiodarone. We'll decrease her amiodarone from 400-300 mg a day. Amiodarone surveillance labs will be checked by her PCP next week.

## 2012-11-11 NOTE — Assessment & Plan Note (Signed)
Ell controlled

## 2012-11-12 ENCOUNTER — Other Ambulatory Visit: Payer: Self-pay | Admitting: *Deleted

## 2012-11-12 MED ORDER — AMIODARONE HCL 200 MG PO TABS: 300.0000 mg | ORAL_TABLET | Freq: Two times a day (BID) | ORAL | Status: DC

## 2012-11-21 ENCOUNTER — Telehealth: Payer: Self-pay | Admitting: Cardiology

## 2012-11-21 NOTE — Telephone Encounter (Signed)
[name] called with questions regarding her amiodarone dose. She was seen by Dr. Graciela Husbands on 11/15/2012. In his note, it states he will decrease her amiodarone dose to 300 mg daily; however, she was taking 400 mg twice daily. She is currently taking 200 mg in AM and 100 mg in PM. On her AVS instructions, it is written in two different ways so she just wanted clarification. I explained that Dr. Odessa Fleming dictated instructions/plan states she is to take 300 mg daily which she is currently taking. I instructed her to continue this for now and I will confirm this dose with Dr. Graciela Husbands tomorrow. She expressed verbal understanding and agrees.

## 2012-11-22 ENCOUNTER — Ambulatory Visit (HOSPITAL_COMMUNITY): Payer: Medicare Other | Attending: Cardiovascular Disease | Admitting: Radiology

## 2012-11-22 ENCOUNTER — Telehealth: Payer: Self-pay | Admitting: Internal Medicine

## 2012-11-22 VITALS — BP 122/50 | Ht 61.0 in | Wt 261.0 lb

## 2012-11-22 DIAGNOSIS — I739 Peripheral vascular disease, unspecified: Secondary | ICD-10-CM | POA: Insufficient documentation

## 2012-11-22 DIAGNOSIS — I10 Essential (primary) hypertension: Secondary | ICD-10-CM | POA: Insufficient documentation

## 2012-11-22 DIAGNOSIS — E119 Type 2 diabetes mellitus without complications: Secondary | ICD-10-CM | POA: Insufficient documentation

## 2012-11-22 DIAGNOSIS — R0609 Other forms of dyspnea: Secondary | ICD-10-CM | POA: Insufficient documentation

## 2012-11-22 DIAGNOSIS — I509 Heart failure, unspecified: Secondary | ICD-10-CM | POA: Insufficient documentation

## 2012-11-22 DIAGNOSIS — R0602 Shortness of breath: Secondary | ICD-10-CM

## 2012-11-22 DIAGNOSIS — R0989 Other specified symptoms and signs involving the circulatory and respiratory systems: Secondary | ICD-10-CM | POA: Insufficient documentation

## 2012-11-22 DIAGNOSIS — R079 Chest pain, unspecified: Secondary | ICD-10-CM

## 2012-11-22 DIAGNOSIS — I4891 Unspecified atrial fibrillation: Secondary | ICD-10-CM | POA: Insufficient documentation

## 2012-11-22 MED ORDER — TECHNETIUM TC 99M SESTAMIBI GENERIC - CARDIOLITE
30.0000 | Freq: Once | INTRAVENOUS | Status: AC | PRN
  Administered 2012-11-22: 30 via INTRAVENOUS

## 2012-11-22 MED ORDER — REGADENOSON 0.4 MG/5ML IV SOLN
0.4000 mg | Freq: Once | INTRAVENOUS | Status: AC
  Administered 2012-11-22: 0.4 mg via INTRAVENOUS

## 2012-11-22 NOTE — Telephone Encounter (Signed)
Forward 7 pages from [facility] to Dr. Sherryl Manges for review on 11-22-12 ym

## 2012-11-22 NOTE — Progress Notes (Signed)
[facility] SITE 3 NUCLEAR MED [address] [place], [place] [address] [phone]    Cardiology Nuclear Med Study  [name] is a 72 y.o. female     MRN : [id number]     DOB: [birth year]  Procedure Date: 11/22/2012  Nuclear Med Background Indication for Stress Test:  Evaluation for Ischemia History:  AFIB/AFLUTTER, CHF, 2007 MPS: (-) ischemia EF: 69% 08/2011 ECHO: EF: 60% NL LVF mild LVH Cardioversion, 10/22/11 Cardioversion Cardiac Risk Factors: Hypertension, Lipids, NIDDM, Obesity and PVD  Symptoms:  Chest Pain and DOE   Nuclear Pre-Procedure Caffeine/Decaff Intake:  None> 12 hrs NPO After: 9:00pm   Lungs:  clear O2 Sat: 95% on room air. IV 0.9% NS with Angio Cath:  24g  IV Site: R Hand x 1, tolerated well IV Started by:  Irean Hong, RN  Chest Size (in):  42 Cup Size: D  Height: 5\' 1"  (1.549 m)  Weight:  261 lb (118.389 kg)  BMI:  Body mass index is 49.32 kg/(m^2). Tech Comments:  n/a    Nuclear Med Study 1 or 2 day study: 2 day  Stress Test Type:  Lexiscan  Reading MD: Olga Millers, MD  Order Authorizing Provider:  Sherryl Manges, MD  Resting Radionuclide: Technetium 17m Sestamibi  Resting Radionuclide Dose: 33.0 mCi on 11/23/12   Stress Radionuclide:  Technetium 69m Sestamibi  Stress Radionuclide Dose: 33.0 mCi on 11/22/12           Stress Protocol Rest HR: 65 Stress HR: 78  Rest BP: 122/50 Stress BP: 126/49  Exercise Time (min): n/a METS: n/a   Predicted Max HR: 151 bpm % Max HR: 51.66 bpm Rate Pressure Product: 9828    Dose of Adenosine (mg):  n/a Dose of Lexiscan: 0.4 mg  Dose of Atropine (mg): n/a Dose of Dobutamine: n/a mcg/kg/min (at max HR)  Stress Test Technologist: Milana Na, EMT-P  Nuclear Technologist:  Domenic Polite, CNMT     Rest Procedure:  Myocardial perfusion imaging was performed at rest 45 minutes following the intravenous administration of Technetium 78m Sestamibi. Rest ECG: NSR - Normal EKG  Stress Procedure:   The patient received IV Lexiscan 0.4 mg over 15-seconds.  Technetium 69m Sestamibi injected at 30-seconds. The patient was sob with Lexiscan. Quantitative spect images were obtained after a 45 minute delay. Stress ECG: No significant ST segment change suggestive of ischemia.  QPS Raw Data Images:  Acquisition technically good; normal left ventricular size. Stress Images:  Normal homogeneous uptake in all areas of the myocardium. Rest Images:  Normal homogeneous uptake in all areas of the myocardium. Subtraction (SDS):  No evidence of ischemia. Transient Ischemic Dilatation (Normal <1.22):  1.25 Lung/Heart Ratio (Normal <0.45):  0.40  Quantitative Gated Spect Images QGS EDV:  87 ml QGS ESV:  21 ml  Impression Exercise Capacity:  Lexiscan with no exercise. BP Response:  Normal blood pressure response. Clinical Symptoms:  There is dyspnea. ECG Impression:  No significant ST segment change suggestive of ischemia. Comparison with Prior Nuclear Study: No images to compare  Overall Impression:  Normal stress nuclear study.  LV Ejection Fraction: 75%.  LV Wall Motion:  NL LV Function; NL Wall Motion  Olga Millers

## 2012-11-23 ENCOUNTER — Ambulatory Visit (HOSPITAL_COMMUNITY): Payer: Medicare Other | Attending: Cardiology | Admitting: Radiology

## 2012-11-23 DIAGNOSIS — R0989 Other specified symptoms and signs involving the circulatory and respiratory systems: Secondary | ICD-10-CM

## 2012-11-23 MED ORDER — TECHNETIUM TC 99M SESTAMIBI GENERIC - CARDIOLITE
33.0000 | Freq: Once | INTRAVENOUS | Status: AC | PRN
  Administered 2012-11-23: 33 via INTRAVENOUS

## 2012-12-01 ENCOUNTER — Ambulatory Visit (INDEPENDENT_AMBULATORY_CARE_PROVIDER_SITE_OTHER): Payer: Medicare Other | Admitting: Pharmacist

## 2012-12-01 DIAGNOSIS — I4892 Unspecified atrial flutter: Secondary | ICD-10-CM

## 2012-12-01 DIAGNOSIS — Z7901 Long term (current) use of anticoagulants: Secondary | ICD-10-CM

## 2012-12-01 DIAGNOSIS — I4891 Unspecified atrial fibrillation: Secondary | ICD-10-CM

## 2012-12-01 LAB — POCT INR: INR: 2.7

## 2012-12-31 ENCOUNTER — Telehealth: Payer: Self-pay | Admitting: Internal Medicine

## 2012-12-31 ENCOUNTER — Other Ambulatory Visit: Payer: Self-pay

## 2012-12-31 DIAGNOSIS — I10 Essential (primary) hypertension: Secondary | ICD-10-CM

## 2012-12-31 MED ORDER — LISINOPRIL 40 MG PO TABS: 40.0000 mg | ORAL_TABLET | Freq: Every day | ORAL | Status: DC

## 2012-12-31 NOTE — Telephone Encounter (Signed)
New problem    Need clarification on current medication .   C/o blood pressure this am  179/82. Headache for the last two days.

## 2012-12-31 NOTE — Telephone Encounter (Signed)
Went over medication list with pt and according to her she has not been taking Lisinopril 40 mg daily and she is not sure why.  Pt is advised that I have sent RX for Lisinopril to [facility] and to resume taking , she verbalized understanding.

## 2013-01-12 ENCOUNTER — Ambulatory Visit (INDEPENDENT_AMBULATORY_CARE_PROVIDER_SITE_OTHER): Payer: Medicare Other | Admitting: *Deleted

## 2013-01-12 DIAGNOSIS — I4891 Unspecified atrial fibrillation: Secondary | ICD-10-CM

## 2013-01-12 DIAGNOSIS — Z7901 Long term (current) use of anticoagulants: Secondary | ICD-10-CM

## 2013-01-12 DIAGNOSIS — I4892 Unspecified atrial flutter: Secondary | ICD-10-CM

## 2013-01-12 LAB — POCT INR: INR: 1.9

## 2013-01-19 ENCOUNTER — Telehealth: Payer: Self-pay | Admitting: Internal Medicine

## 2013-01-19 NOTE — Telephone Encounter (Signed)
Spoke with patient regarding several questions about her medications.  Patient states she is out of her Levothyroxine and the pharmacy doesn't open until 0900 but she is accustomed to taking it at 0600 daily.  I assured patient that it would be fine to take the Levothyroxine at 0900 when she obtains her pills from the pharmacy.  Patient then wanted to go through every medication and ensure that she was taking the correct dose.  Patient is taking 0.2 mg Clonidine BID but patient's last office visit instructions state patient is supposed to take 0.1 mg BID.  Patient instructed to remain on regular dose until we receive clarification from [facility].  Patient also taking 80 mg Pravachol daily while instructions state take 40 mg QD and taking 40 mg Lasix daily instead of 20 mg per instructions.  Patient would like clarification and would also like to know when she needs to return for lab work.

## 2013-01-19 NOTE — Telephone Encounter (Signed)
New Prob     Pt has a questions regarding her levothyroxine medication. Would like to speak to nurse.

## 2013-01-19 NOTE — Telephone Encounter (Signed)
Alfred from [facility] called to notify us that they are using a different manufacturer for patient's Levothyroxine 100 mg.

## 2013-01-19 NOTE — Telephone Encounter (Signed)
The MAR is confusing  And i would have her take what she has been at home  thnks

## 2013-01-19 NOTE — Telephone Encounter (Signed)
Patient notified of Dr. Odessa Fleming instructions to remain on current medications as she has been taking them.  Patient verbalized understanding.

## 2013-02-03 ENCOUNTER — Other Ambulatory Visit: Payer: Self-pay | Admitting: Pharmacist

## 2013-02-03 DIAGNOSIS — I4891 Unspecified atrial fibrillation: Secondary | ICD-10-CM

## 2013-02-03 MED ORDER — WARFARIN SODIUM 5 MG PO TABS: ORAL_TABLET | ORAL | Status: DC

## 2013-02-08 ENCOUNTER — Other Ambulatory Visit: Payer: Self-pay

## 2013-02-08 MED ORDER — AMIODARONE HCL 200 MG PO TABS: 300.0000 mg | ORAL_TABLET | Freq: Two times a day (BID) | ORAL | Status: DC

## 2013-02-09 ENCOUNTER — Ambulatory Visit (INDEPENDENT_AMBULATORY_CARE_PROVIDER_SITE_OTHER): Payer: Medicare Other | Admitting: Pharmacist

## 2013-02-09 DIAGNOSIS — I4891 Unspecified atrial fibrillation: Secondary | ICD-10-CM

## 2013-02-09 DIAGNOSIS — I4892 Unspecified atrial flutter: Secondary | ICD-10-CM

## 2013-02-09 DIAGNOSIS — Z7901 Long term (current) use of anticoagulants: Secondary | ICD-10-CM

## 2013-02-09 LAB — POCT INR: INR: 3.1

## 2013-03-09 ENCOUNTER — Ambulatory Visit (INDEPENDENT_AMBULATORY_CARE_PROVIDER_SITE_OTHER): Payer: Medicare Other | Admitting: *Deleted

## 2013-03-09 DIAGNOSIS — I4892 Unspecified atrial flutter: Secondary | ICD-10-CM

## 2013-03-09 DIAGNOSIS — I4891 Unspecified atrial fibrillation: Secondary | ICD-10-CM

## 2013-03-09 DIAGNOSIS — Z7901 Long term (current) use of anticoagulants: Secondary | ICD-10-CM

## 2013-03-16 ENCOUNTER — Other Ambulatory Visit (HOSPITAL_COMMUNITY): Payer: Self-pay | Admitting: Internal Medicine

## 2013-03-16 DIAGNOSIS — Z1231 Encounter for screening mammogram for malignant neoplasm of breast: Secondary | ICD-10-CM

## 2013-04-01 ENCOUNTER — Telehealth: Payer: Self-pay | Admitting: Internal Medicine

## 2013-04-01 NOTE — Telephone Encounter (Signed)
Per pt call states she has been having fatigue, H/A and blurry vision.  She is as reporting a bad taste in her mouth.  She recently saw her PCP who told her the s/s could be coming from her Amiodarone.  She has had these s/s for 3 to 4 weeks and they are not getting better.  Aware information will be forwarded to her MD for review and she will be called back but to stay on her current medications

## 2013-04-01 NOTE — Telephone Encounter (Signed)
Follow Up ° ° °Pt returning call from earlier. Please call back. °

## 2013-04-01 NOTE — Telephone Encounter (Signed)
New Prob     Pt has a question regarding one her medication (AMIODARONE). States she is experiencing some of the side effects. Please call.

## 2013-04-01 NOTE — Telephone Encounter (Signed)
Left message to call back  

## 2013-04-04 NOTE — Telephone Encounter (Signed)
This may be amiodarone. Almost anything can be amiodarone. I would have her check with her pharmacist and do a medication side effect review. If they don't find anything more likely to be the culprit let us know we can think about stopping the amiodarone  Also please clarify what her dosing is. Thank you

## 2013-04-04 NOTE — Telephone Encounter (Signed)
Pt aware and will check with her pharmacist

## 2013-04-05 ENCOUNTER — Observation Stay (HOSPITAL_COMMUNITY)
Admission: EM | Admit: 2013-04-05 | Discharge: 2013-04-07 | DRG: 683 | Disposition: A | Discharge status: Home / Self Care | Payer: Medicare Other | Attending: Internal Medicine | Admitting: Internal Medicine

## 2013-04-05 ENCOUNTER — Emergency Department (HOSPITAL_COMMUNITY): Payer: Medicare Other

## 2013-04-05 ENCOUNTER — Encounter (HOSPITAL_COMMUNITY): Payer: Self-pay | Admitting: Physical Medicine and Rehabilitation

## 2013-04-05 DIAGNOSIS — N189 Chronic kidney disease, unspecified: Secondary | ICD-10-CM | POA: Diagnosis present

## 2013-04-05 DIAGNOSIS — I129 Hypertensive chronic kidney disease with stage 1 through stage 4 chronic kidney disease, or unspecified chronic kidney disease: Secondary | ICD-10-CM | POA: Diagnosis present

## 2013-04-05 DIAGNOSIS — G4733 Obstructive sleep apnea (adult) (pediatric): Secondary | ICD-10-CM

## 2013-04-05 DIAGNOSIS — Z8669 Personal history of other diseases of the nervous system and sense organs: Secondary | ICD-10-CM

## 2013-04-05 DIAGNOSIS — E785 Hyperlipidemia, unspecified: Secondary | ICD-10-CM | POA: Diagnosis present

## 2013-04-05 DIAGNOSIS — I509 Heart failure, unspecified: Secondary | ICD-10-CM | POA: Diagnosis present

## 2013-04-05 DIAGNOSIS — N390 Urinary tract infection, site not specified: Secondary | ICD-10-CM | POA: Diagnosis present

## 2013-04-05 DIAGNOSIS — D72829 Elevated white blood cell count, unspecified: Secondary | ICD-10-CM

## 2013-04-05 DIAGNOSIS — I4892 Unspecified atrial flutter: Secondary | ICD-10-CM | POA: Diagnosis present

## 2013-04-05 DIAGNOSIS — N179 Acute kidney failure, unspecified: Principal | ICD-10-CM

## 2013-04-05 DIAGNOSIS — M109 Gout, unspecified: Secondary | ICD-10-CM

## 2013-04-05 DIAGNOSIS — I4891 Unspecified atrial fibrillation: Secondary | ICD-10-CM

## 2013-04-05 DIAGNOSIS — E039 Hypothyroidism, unspecified: Secondary | ICD-10-CM | POA: Diagnosis present

## 2013-04-05 DIAGNOSIS — I152 Hypertension secondary to endocrine disorders: Secondary | ICD-10-CM | POA: Diagnosis present

## 2013-04-05 DIAGNOSIS — R079 Chest pain, unspecified: Secondary | ICD-10-CM

## 2013-04-05 DIAGNOSIS — I440 Atrioventricular block, first degree: Secondary | ICD-10-CM | POA: Diagnosis present

## 2013-04-05 DIAGNOSIS — R001 Bradycardia, unspecified: Secondary | ICD-10-CM

## 2013-04-05 DIAGNOSIS — E875 Hyperkalemia: Secondary | ICD-10-CM

## 2013-04-05 DIAGNOSIS — M7660 Achilles tendinitis, unspecified leg: Secondary | ICD-10-CM

## 2013-04-05 DIAGNOSIS — E871 Hypo-osmolality and hyponatremia: Secondary | ICD-10-CM

## 2013-04-05 DIAGNOSIS — N289 Disorder of kidney and ureter, unspecified: Secondary | ICD-10-CM

## 2013-04-05 DIAGNOSIS — R42 Dizziness and giddiness: Secondary | ICD-10-CM | POA: Diagnosis present

## 2013-04-05 DIAGNOSIS — N10 Acute tubulo-interstitial nephritis: Secondary | ICD-10-CM

## 2013-04-05 DIAGNOSIS — E1159 Type 2 diabetes mellitus with other circulatory complications: Secondary | ICD-10-CM | POA: Diagnosis present

## 2013-04-05 DIAGNOSIS — I5032 Chronic diastolic (congestive) heart failure: Secondary | ICD-10-CM

## 2013-04-05 DIAGNOSIS — E860 Dehydration: Secondary | ICD-10-CM

## 2013-04-05 DIAGNOSIS — Z7901 Long term (current) use of anticoagulants: Secondary | ICD-10-CM

## 2013-04-05 DIAGNOSIS — E119 Type 2 diabetes mellitus without complications: Secondary | ICD-10-CM | POA: Diagnosis present

## 2013-04-05 DIAGNOSIS — I10 Essential (primary) hypertension: Secondary | ICD-10-CM

## 2013-04-05 DIAGNOSIS — M549 Dorsalgia, unspecified: Secondary | ICD-10-CM

## 2013-04-05 HISTORY — DX: Chronic kidney disease, unspecified: N18.9

## 2013-04-05 LAB — URINALYSIS, ROUTINE W REFLEX MICROSCOPIC
Hgb urine dipstick: NEGATIVE
Nitrite: NEGATIVE
Specific Gravity, Urine: 1.011 (ref 1.005–1.030)
Urobilinogen, UA: 0.2 mg/dL (ref 0.0–1.0)
pH: 5.5 (ref 5.0–8.0)

## 2013-04-05 LAB — TROPONIN I: Troponin I: <0.3 ng/mL (ref <0.30)

## 2013-04-05 LAB — CBC
HCT: 38 % (ref 36.0–46.0)
MCV: 82.4 fL (ref 78.0–100.0)
Platelets: 280 10*3/uL (ref 150–400)
RBC: 4.61 MIL/uL (ref 3.87–5.11)
WBC: 12 10*3/uL — ABNORMAL HIGH (ref 4.0–10.5)

## 2013-04-05 LAB — RAPID URINE DRUG SCREEN, HOSP PERFORMED: Amphetamines: NOT DETECTED

## 2013-04-05 LAB — CBC WITH DIFFERENTIAL/PLATELET
Hemoglobin: 12.6 g/dL (ref 12.0–15.0)
Lymphocytes Relative: 17 % (ref 12–46)
Lymphs Abs: 1.8 10*3/uL (ref 0.7–4.0)
MCH: 27.5 pg (ref 26.0–34.0)
Monocytes Relative: 13 % — ABNORMAL HIGH (ref 3–12)
Neutrophils Relative %: 68 % (ref 43–77)
Platelets: 302 10*3/uL (ref 150–400)
RBC: 4.59 MIL/uL (ref 3.87–5.11)
WBC: 11.1 10*3/uL — ABNORMAL HIGH (ref 4.0–10.5)

## 2013-04-05 LAB — GLUCOSE, CAPILLARY: Glucose-Capillary: 209 mg/dL — ABNORMAL HIGH (ref 70–99)

## 2013-04-05 LAB — HEMOGLOBIN A1C
Hgb A1c MFr Bld: 8.1 % — ABNORMAL HIGH (ref <5.7)
Mean Plasma Glucose: 186 mg/dL — ABNORMAL HIGH (ref <117)

## 2013-04-05 LAB — COMPREHENSIVE METABOLIC PANEL
ALT: 72 U/L — ABNORMAL HIGH (ref 0–35)
Alkaline Phosphatase: 120 U/L — ABNORMAL HIGH (ref 39–117)
BUN: 42 mg/dL — ABNORMAL HIGH (ref 6–23)
CO2: 25 mEq/L (ref 19–32)
Chloride: 100 mEq/L (ref 96–112)
GFR calc Af Amer: 39 mL/min — ABNORMAL LOW (ref >90)
GFR calc non Af Amer: 34 mL/min — ABNORMAL LOW (ref >90)
Glucose, Bld: 224 mg/dL — ABNORMAL HIGH (ref 70–99)
Potassium: 5.5 mEq/L — ABNORMAL HIGH (ref 3.5–5.1)
Sodium: 136 mEq/L (ref 135–145)
Total Bilirubin: 0.3 mg/dL (ref 0.3–1.2)

## 2013-04-05 LAB — PRO B NATRIURETIC PEPTIDE: Pro B Natriuretic peptide (BNP): 188.6 pg/mL — ABNORMAL HIGH (ref 0–125)

## 2013-04-05 LAB — CREATININE, SERUM: GFR calc Af Amer: 41 mL/min — ABNORMAL LOW (ref >90)

## 2013-04-05 MED ORDER — WARFARIN SODIUM 2.5 MG PO TABS: 2.5000 mg | ORAL_TABLET | Freq: Every day | ORAL | Status: DC

## 2013-04-05 MED ORDER — SODIUM POLYSTYRENE SULFONATE 15 GM/60ML PO SUSP
30.0000 g | Freq: Once | ORAL | Status: AC
  Administered 2013-04-05: 30 g via ORAL
  Filled 2013-04-05: qty 120

## 2013-04-05 MED ORDER — FUROSEMIDE 20 MG PO TABS
20.0000 mg | ORAL_TABLET | Freq: Every day | ORAL | Status: DC
  Administered 2013-04-06 – 2013-04-07 (×2): 20 mg via ORAL
  Filled 2013-04-05 (×2): qty 1

## 2013-04-05 MED ORDER — WARFARIN - PHYSICIAN DOSING INPATIENT: Freq: Every day | Status: DC

## 2013-04-05 MED ORDER — SODIUM CHLORIDE 0.9 % IV SOLN: INTRAVENOUS | Status: DC

## 2013-04-05 MED ORDER — INSULIN DETEMIR 100 UNIT/ML ~~LOC~~ SOLN
10.0000 [IU] | Freq: Every day | SUBCUTANEOUS | Status: DC
  Filled 2013-04-05 (×2): qty 0.1

## 2013-04-05 MED ORDER — LEVOTHYROXINE SODIUM 100 MCG PO TABS
100.0000 ug | ORAL_TABLET | Freq: Every day | ORAL | Status: DC
  Administered 2013-04-06 – 2013-04-07 (×2): 100 ug via ORAL
  Filled 2013-04-05 (×4): qty 1

## 2013-04-05 MED ORDER — INSULIN ASPART 100 UNIT/ML ~~LOC~~ SOLN
0.0000 [IU] | Freq: Three times a day (TID) | SUBCUTANEOUS | Status: DC
  Administered 2013-04-06: 3 [IU] via SUBCUTANEOUS
  Administered 2013-04-06: 2 [IU] via SUBCUTANEOUS
  Administered 2013-04-06: 3 [IU] via SUBCUTANEOUS
  Administered 2013-04-07: 2 [IU] via SUBCUTANEOUS
  Administered 2013-04-07: 3 [IU] via SUBCUTANEOUS

## 2013-04-05 MED ORDER — AMIODARONE HCL 200 MG PO TABS
300.0000 mg | ORAL_TABLET | Freq: Two times a day (BID) | ORAL | Status: DC
  Administered 2013-04-05 – 2013-04-07 (×4): 300 mg via ORAL
  Filled 2013-04-05 (×6): qty 1

## 2013-04-05 MED ORDER — ASPIRIN 325 MG PO TABS
325.0000 mg | ORAL_TABLET | Freq: Every day | ORAL | Status: DC
  Administered 2013-04-05 – 2013-04-07 (×3): 325 mg via ORAL
  Filled 2013-04-05 (×3): qty 1

## 2013-04-05 MED ORDER — WARFARIN SODIUM 2.5 MG PO TABS
2.5000 mg | ORAL_TABLET | ORAL | Status: DC
  Administered 2013-04-06: 2.5 mg via ORAL
  Filled 2013-04-05 (×2): qty 1

## 2013-04-05 MED ORDER — WARFARIN SODIUM 5 MG PO TABS
5.0000 mg | ORAL_TABLET | ORAL | Status: DC
  Filled 2013-04-05: qty 1

## 2013-04-05 MED ORDER — ASPIRIN 300 MG RE SUPP: 300.0000 mg | Freq: Every day | RECTAL | Status: DC

## 2013-04-05 MED ORDER — AMLODIPINE BESYLATE 5 MG PO TABS
5.0000 mg | ORAL_TABLET | Freq: Every day | ORAL | Status: DC
  Administered 2013-04-06 – 2013-04-07 (×2): 5 mg via ORAL
  Filled 2013-04-05 (×2): qty 1

## 2013-04-05 MED ORDER — ENOXAPARIN SODIUM 30 MG/0.3ML ~~LOC~~ SOLN
30.0000 mg | SUBCUTANEOUS | Status: DC
  Administered 2013-04-05: 30 mg via SUBCUTANEOUS
  Filled 2013-04-05: qty 0.3

## 2013-04-05 NOTE — H&P (Signed)
[facility] History and Physical  [name] ZOX:[id number] DOB: 05-09-1941 DOA: 04/05/2013  Referring physician: ED PCP: Mia Creek, MD   Chief Complaint: Dizziness   HPI:  72 year old female with a history of atrial fibrillation, congestive heart failure, hypertension, chronic kidney disease who presents to the ER with chief complaint of dizziness. The patient has had marked atrial arrhythmias including atrial flutter and atrial fibrillation for which he has tried flecainide, dofetilide, and most recently has been on amiodarone.  Echocardiogram November 2012 demonstrated normal left ventricular function with mild LVH; atrial chambers were also normal in size .She is intercurrently been seen in the hospital for tachypalpitations and was found to have atrial fibrillation which terminated spontaneously. She is on amiodarone dose was increased to 200 twice daily  NO associated n,v,d, fevers, URi symptoms, dysuria, CP,. NO focal or generalized weakness. She is not currently feels dizzy. Sitting down to rest makes her dizziness resolve. She came to the ED because she feels it is getting worse.        Review of Systems: negative for the following  Constitutional: Denies fever, chills, diaphoresis, appetite change and fatigue.  HEENT: Denies photophobia, eye pain, redness, hearing loss, ear pain, congestion, sore throat, rhinorrhea, sneezing, mouth sores, trouble swallowing, neck pain, neck stiffness and tinnitus.  Respiratory: Denies SOB, DOE, cough, chest tightness, and wheezing.  Cardiovascular: Denies chest pain, palpitations and leg swelling.  Gastrointestinal: Denies nausea, vomiting, abdominal pain, diarrhea, constipation, blood in stool and abdominal distention.  Genitourinary: Denies dysuria, urgency, frequency, hematuria, flank pain and difficulty urinating.  Musculoskeletal: Denies myalgias, back pain, joint swelling, arthralgias and gait problem.  Skin: Denies  pallor, rash and wound.  Neurological: Denies dizziness, seizures, syncope, weakness, light-headedness, numbness and headaches.  Hematological: Denies adenopathy. Easy bruising, personal or family bleeding history  Psychiatric/Behavioral: Denies suicidal ideation, mood changes, confusion, nervousness, sleep disturbance and agitation       Past Medical History  Diagnosis Date  . Atrial fibrillation /flutter   . Hypertension     a.  Renal arterial Dopplers 12/2011: 1-59% right renal artery stenosis  . Diabetes mellitus   . Hyperlipidemia   . Gout   . 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 Diastolic CHF, chronic     a.  echo 2006 - ef 55-65%; mild diast dysfxn;    b. Echo 08/2011: Mild LVH, EF 60%.   . Arthritis   . Morbid obesity   . OSA (obstructive sleep apnea)     uses cpap  . CHF (congestive heart failure)   . Back pain      Past Surgical History  Procedure Laterality Date  . Cholecystectomy    . Total abdominal hysterectomy    . Appendectomy    . Tonsillectomy  1982  . Cardioversion  10/22/2011    Procedure: CARDIOVERSION;  Surgeon: Duke Salvia, MD;  Location: [facility];  Service: Cardiovascular;  Laterality: N/A;      Social History:  reports that she has never smoked. She has never used smokeless tobacco. She reports that she does not drink alcohol or use illicit drugs.    No Known Allergies  Family History  Problem Relation Age of Onset  . Heart disease Father   . Hypertension Father   . Breast cancer Sister   . Cancer Sister     breast     Prior to Admission medications   Medication Sig Start Date End Date Taking? Authorizing Provider  amiodarone (PACERONE) 200 MG tablet Take 1.5 tablets (300 mg total) by mouth 2 (two) times daily. 02/08/13  Yes Duke Salvia, MD  amLODipine (NORVASC) 5 MG tablet Take 1 tablet (5 mg total) by mouth daily. 08/05/12  Yes Beatrice Lecher, PA-C  cloNIDine (CATAPRES) 0.2 MG tablet Take 0.2 mg by mouth 2  (two) times daily.  04/15/12 04/15/13 Yes Duke Salvia, MD  furosemide (LASIX) 40 MG tablet Take 0.5 tablets (20 mg total) by mouth daily. 08/05/12  Yes Scott Moishe Spice, PA-C  levothyroxine (SYNTHROID, LEVOTHROID) 100 MCG tablet Take 1 tablet (100 mcg total) by mouth daily. 04/13/12 04/13/13 Yes Ripudeep Jenna Luo, MD  lisinopril (PRINIVIL,ZESTRIL) 40 MG tablet Take 1 tablet (40 mg total) by mouth daily. 12/31/12  Yes Duke Salvia, MD  spironolactone (ALDACTONE) 25 MG tablet Take 25 mg by mouth daily.  03/08/12  Yes Historical Provider, MD  warfarin (COUMADIN) 5 MG tablet Take 2.5-5 mg by mouth daily. Take 5mg  on Tues and Sat, take 2.5mg  all other days   Yes Historical Provider, MD     Physical Exam: Filed Vitals:   04/05/13 1502 04/05/13 1546 04/05/13 1547 04/05/13 1548  BP: 122/57 120/52 130/60 141/76  Pulse: 63 59 62   Temp:      TempSrc:      Resp:      SpO2: 97%        Constitutional: Vital signs reviewed. Patient is a well-developed and well-nourished in no acute distress and cooperative with exam. Alert and oriented x3.  Head: Normocephalic and atraumatic  Ear: TM normal bilaterally  Mouth: no erythema or exudates, MMM  Eyes: PERRL, EOMI, conjunctivae normal, No scleral icterus.  Neck: Supple, Trachea midline normal ROM, No JVD, mass, thyromegaly, or carotid bruit present.  Cardiovascular: RRR, S1 normal, S2 normal, no MRG, pulses symmetric and intact bilaterally  Pulmonary/Chest: CTAB, no wheezes, rales, or rhonchi  Abdominal: Soft. Non-tender, non-distended, bowel sounds are normal, no masses, organomegaly, or guarding present.  GU: no CVA tenderness Musculoskeletal: No joint deformities, erythema, or stiffness, ROM full and no nontender Ext: no edema and no cyanosis, pulses palpable bilaterally (DP and PT)  Hematology: no cervical, inginal, or axillary adenopathy.  Neurological: A&O x3, Strenght is normal and symmetric bilaterally, cranial nerve II-XII are grossly intact, no focal  motor deficit, sensory intact to light touch bilaterally.  Skin: Warm, dry and intact. No rash, cyanosis, or clubbing.  Psychiatric: Normal mood and affect. speech and behavior is normal. Judgment and thought content normal. Cognition and memory are normal.       Labs on Admission:    Basic Metabolic Panel:  Recent Labs Lab 04/05/13 1355  NA 136  K 5.5*  CL 100  CO2 25  GLUCOSE 224*  BUN 42*  CREATININE 1.52*  CALCIUM 8.9   Liver Function Tests:  Recent Labs Lab 04/05/13 1355  AST 47*  ALT 72*  ALKPHOS 120*  BILITOT 0.3  PROT 7.3  ALBUMIN 3.3*   No results found for this basename: LIPASE, AMYLASE,  in the last 168 hours No results found for this basename: AMMONIA,  in the last 168 hours CBC:  Recent Labs Lab 04/05/13 1355  WBC 11.1*  NEUTROABS 7.6  HGB 12.6  HCT 38.1  MCV 83.0  PLT 302   Cardiac Enzymes:  Recent Labs Lab 04/05/13 1355  TROPONINI <0.30    BNP (last 3 results)  Recent Labs  04/05/13 1355  PROBNP 188.6*  CBG: No results found for this basename: GLUCAP,  in the last 168 hours  Radiological Exams on Admission: Dg Chest 2 View  04/05/2013   *RADIOLOGY REPORT*  Clinical Data: Cough.  Dizziness.  Congestive heart failure.  CHEST - 2 VIEW  Comparison: 05/13/2012  Findings: Mild cardiomegaly stable.  Pulmonary interstitial prominence is unchanged.  There is mild scarring seen in the left lung base.  No evidence of acute infiltrate or edema.  No evidence of pleural effusion.  No mass or lymphadenopathy identified.  IMPRESSION: Stable cardiomegaly and mild left basilar scarring.  No active disease.   Original Report Authenticated By: Myles Rosenthal, M.D.   Ct Head Wo Contrast  04/05/2013   *RADIOLOGY REPORT*  Clinical Data: Dizziness and headache.  CT HEAD WITHOUT CONTRAST  Technique:  Contiguous axial images were obtained from the base of the skull through the vertex without contrast.  Comparison: None.  Findings: The ventricles are  normal.  No extra-axial fluid collections are seen.  The brainstem and cerebellum are unremarkable.  No acute intracranial findings such as infarction or hemorrhage.  No mass lesions.  The bony calvarium is intact.  The visualized paranasal sinuses and mastoid air cells are clear.  Small basal ganglia calcifications are noted.  Hyperostosis frontalis interna is present.  IMPRESSION: No acute intracranial findings or mass lesion.   Original Report Authenticated By: Rudie Meyer, M.D.    EKG: Independently reviewed.  Rhythm: sinus rhythm with 1st degree AV block  QRS Axis: normal  Intervals: normal  ST/T Wave abnormalities: normal  Conduction Disutrbances:none  Narrative Interpretation:  Old EKG Reviewed: unchanged from Nov 11, 2012   Assessment/Plan Principal Problem:   Postural dizziness Active Problems:   DM   OBSTRUCTIVE SLEEP APNEA   HYPERTENSION   Chronic diastolic heart failure   Dizziness Orthostatics checked and are negative Hemodynamically stable In sinus rhythm History of paroxysmal atrial flutter/fibrillation On anticoagulation and are pending Need to rule out CVA We'll do a full stroke workup including MRI/MRA/2-D echo/carotid Doppler Recent stress test was negative Lipid panel, hemoglobin A1c pending   Acute kidney injury Creatinine steadily rising, baseline is about 1.1 1.5 to today Hold ACE inhibitor, Lasix, Aldactone Give 1 dose of Kayexalate for hyperkalemia Hydrate gently with IV fluids   Diabetes Check a hemoglobin A1c Start the patient on sliding scale insulin  Code Status:   full Family Communication: bedside Disposition Plan: admit   Time spent: 70 mins   [facility] Pager [phone]  If 7PM-7AM, please contact night-coverage [email or web address] Password [facility] 04/05/2013, 4:36 PM

## 2013-04-05 NOTE — ED Notes (Signed)
Pt states that she has been having dizziness x 2 weeks, and has been getting worse. Pt states that she believes that the cause of her dizziness is being caused by Amioderone. Pt states that she called her PCP, who told her to call her cardiologist, who told her to call her pharmacy. Pt states that she came here today because she was getting worried about the dizziness.

## 2013-04-05 NOTE — ED Notes (Signed)
Pt presents to department for evaluation of dizziness. Ongoing x1 week. Pt describes this as "tingling in her head." states she has these episodes 3x a day. Denies pain at the time. Respirations unlabored. Pt is conscious alert and oriented x4. No neurological deficits noted.

## 2013-04-05 NOTE — Progress Notes (Signed)
Pharmacy Clarification  Dr Susie Cassette aware that lovenox 30 mg sq was given today prior to INR report of 2.19.  Lovenox discontinued.  Home Coumadin dose resumed per previous orders with daily INR in place.  MD to follow.  [place] PharmD BCPS [phone] 04/05/2013 6:37 PM

## 2013-04-05 NOTE — ED Notes (Signed)
Patient transported to CT 

## 2013-04-05 NOTE — ED Provider Notes (Signed)
History     CSN: [id number]  Arrival date & time 04/05/13  1326   First MD Initiated Contact with Patient 04/05/13 1338      Chief Complaint  Patient presents with  . Dizziness    (Consider location/radiation/quality/duration/timing/severity/associated sxs/prior treatment) HPI  Cardiologist DR Graciela Husbands for CHF  Patient presents to the ED with complaints of dizziness intermittently for the past week. She has noticed that when she rests it goes away and describes it as a rocking on a boat. She is concerned that it is her medications as dizziness is a side effect but she has not had any medication changes in two years. NO associated n,v,d, fevers, URi symptoms, dysuria, CP,. NO focal or generalized weakness. She is not currently feels dizzy. Sitting down to rest makes her dizziness resolve. She came to the ED because she feels it is getting worse.  Past Medical History  Diagnosis Date  . Atrial fibrillation /flutter   . Hypertension     a.  Renal arterial Dopplers 12/2011: 1-59% right renal artery stenosis  . Diabetes mellitus   . Hyperlipidemia   . Gout   . 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 Diastolic CHF, chronic     a.  echo 2006 - ef 55-65%; mild diast dysfxn;    b. Echo 08/2011: Mild LVH, EF 60%.   . Arthritis   . Morbid obesity   . OSA (obstructive sleep apnea)     uses cpap  . CHF (congestive heart failure)   . Back pain     Past Surgical History  Procedure Laterality Date  . Cholecystectomy    . Total abdominal hysterectomy    . Appendectomy    . Tonsillectomy  1982  . Cardioversion  10/22/2011    Procedure: CARDIOVERSION;  Surgeon: Duke Salvia, MD;  Location: [facility] OR;  Service: Cardiovascular;  Laterality: N/A;    Family History  Problem Relation Age of Onset  . Heart disease Father   . Hypertension Father   . Breast cancer Sister   . Cancer Sister     breast    History  Substance Use Topics  . Smoking status: Never Smoker   .  Smokeless tobacco: Never Used  . Alcohol Use: No    OB History   Grav Para Term Preterm Abortions TAB SAB Ect Mult Living                  Review of Systems  Neurological: Positive for dizziness.  All other systems reviewed and are negative.    Allergies  Review of patient's allergies indicates no known allergies.  Home Medications   Current Outpatient Rx  Name  Route  Sig  Dispense  Refill  . amiodarone (PACERONE) 200 MG tablet   Oral   Take 1.5 tablets (300 mg total) by mouth 2 (two) times daily.   90 tablet   3   . amLODipine (NORVASC) 5 MG tablet   Oral   Take 1 tablet (5 mg total) by mouth daily.   30 tablet   6   . cloNIDine (CATAPRES) 0.2 MG tablet   Oral   Take 0.2 mg by mouth 2 (two) times daily.          . furosemide (LASIX) 40 MG tablet   Oral   Take 0.5 tablets (20 mg total) by mouth daily.         Marland Kitchen levothyroxine (SYNTHROID, LEVOTHROID) 100 MCG tablet  Oral   Take 1 tablet (100 mcg total) by mouth daily.   90 tablet   3   . lisinopril (PRINIVIL,ZESTRIL) 40 MG tablet   Oral   Take 1 tablet (40 mg total) by mouth daily.   30 tablet   3   . spironolactone (ALDACTONE) 25 MG tablet   Oral   Take 25 mg by mouth daily.          Marland Kitchen warfarin (COUMADIN) 5 MG tablet   Oral   Take 2.5-5 mg by mouth daily. Take 5mg  on Tues and Sat, take 2.5mg  all other days           BP 122/57  Pulse 63  Temp(Src) 97.9 F (36.6 C) (Oral)  Resp 18  SpO2 97%  Physical Exam  Nursing note and vitals reviewed. Constitutional: She is oriented to person, place, and time. She appears well-developed and well-nourished. No distress.  obese  HENT:  Head: Normocephalic and atraumatic.  Eyes: Pupils are equal, round, and reactive to light.  Neck: Normal range of motion. Neck supple.  Cardiovascular: Normal rate and regular rhythm.   Pulmonary/Chest: Effort normal.  Abdominal: Soft.  Neurological: She is alert and oriented to person, place, and time. She  has normal strength. No cranial nerve deficit or sensory deficit. She displays a negative Romberg sign.  Skin: Skin is warm and dry.    ED Course  Procedures (including critical care time)  Labs Reviewed  CBC WITH DIFFERENTIAL - Abnormal; Notable for the following:    WBC 11.1 (*)    RDW 15.9 (*)    Monocytes Relative 13 (*)    Monocytes Absolute 1.4 (*)    All other components within normal limits  COMPREHENSIVE METABOLIC PANEL - Abnormal; Notable for the following:    Potassium 5.5 (*)    Glucose, Bld 224 (*)    BUN 42 (*)    Creatinine, Ser 1.52 (*)    Albumin 3.3 (*)    AST 47 (*)    ALT 72 (*)    Alkaline Phosphatase 120 (*)    GFR calc non Af Amer 34 (*)    GFR calc Af Amer 39 (*)    All other components within normal limits  PRO B NATRIURETIC PEPTIDE - Abnormal; Notable for the following:    Pro B Natriuretic peptide (BNP) 188.6 (*)    All other components within normal limits  TROPONIN I  URINALYSIS, ROUTINE W REFLEX MICROSCOPIC   Dg Chest 2 View  04/05/2013   *RADIOLOGY REPORT*  Clinical Data: Cough.  Dizziness.  Congestive heart failure.  CHEST - 2 VIEW  Comparison: 05/13/2012  Findings: Mild cardiomegaly stable.  Pulmonary interstitial prominence is unchanged.  There is mild scarring seen in the left lung base.  No evidence of acute infiltrate or edema.  No evidence of pleural effusion.  No mass or lymphadenopathy identified.  IMPRESSION: Stable cardiomegaly and mild left basilar scarring.  No active disease.   Original Report Authenticated By: Myles Rosenthal, M.D.   Ct Head Wo Contrast  04/05/2013   *RADIOLOGY REPORT*  Clinical Data: Dizziness and headache.  CT HEAD WITHOUT CONTRAST  Technique:  Contiguous axial images were obtained from the base of the skull through the vertex without contrast.  Comparison: None.  Findings: The ventricles are normal.  No extra-axial fluid collections are seen.  The brainstem and cerebellum are unremarkable.  No acute intracranial findings  such as infarction or hemorrhage.  No mass lesions.  The bony  calvarium is intact.  The visualized paranasal sinuses and mastoid air cells are clear.  Small basal ganglia calcifications are noted.  Hyperostosis frontalis interna is present.  IMPRESSION: No acute intracranial findings or mass lesion.   Original Report Authenticated By: Rudie Meyer, M.D.     1. Renal insufficiency   2. Dehydration       MDM   Date: 04/05/2013  Rate: 67  Rhythm: sinus rhythm with 1st degree AV block  QRS Axis: normal  Intervals: normal  ST/T Wave abnormalities: normal  Conduction Disutrbances:none  Narrative Interpretation:   Old EKG Reviewed: unchanged from Nov 11, 2012  Patient has a very vague complaint that may be vertigo but she is not currently having the symptoms so it is difficult to evaluate. Will do comprehensive evaluation and then re-eval.  Discussed case with Dr. Clarene Duke and lab results show that patient has signs of dehydrations with bumped creatinine and decreased renal function.   [facility], inpatient, Delton See,           Dorthula Matas, PA-C 04/05/13 1534

## 2013-04-05 NOTE — ED Notes (Signed)
Report given to the floor. Tiffany, RN is the receiving nurse.

## 2013-04-05 NOTE — Progress Notes (Signed)
Patient placed on auto CPAP max 20.0 min 4.0 via nasal mask. Tolerating well at this time. RT will continue to monitor.

## 2013-04-05 NOTE — Evaluation (Signed)
Received call from nursing.  Pt requesting CPAP and clonidine (home medication) Admitted for dizziness.  BPs stable.  Plan: Order CPAP Hold clonidine as BPs stable unless clinically needed (SBP >175). Med may be source of dizziness.   Wt Readings from Last 3 Encounters:  04/05/13 120.611 kg (265 lb 14.4 oz)  11/22/12 118.389 kg (261 lb)  11/11/12 119.296 kg (263 lb)   Temp Readings from Last 3 Encounters:  04/05/13 97.7 F (36.5 C) Oral  07/13/12 98 F (36.7 C) Oral  05/13/12 98.1 F (36.7 C) Oral   BP Readings from Last 3 Encounters:  04/05/13 133/61  11/22/12 122/50  11/11/12 109/54   Pulse Readings from Last 3 Encounters:  04/05/13 62  11/11/12 67  08/05/12 67

## 2013-04-05 NOTE — Progress Notes (Signed)
Pt passed swallow screen.  Dr. Susie Cassette text/paged, order given for carb mod diet.

## 2013-04-06 ENCOUNTER — Inpatient Hospital Stay (HOSPITAL_COMMUNITY): Payer: Medicare Other

## 2013-04-06 DIAGNOSIS — I369 Nonrheumatic tricuspid valve disorder, unspecified: Secondary | ICD-10-CM

## 2013-04-06 LAB — CREATININE, URINE, RANDOM: Creatinine, Urine: 92.93 mg/dL

## 2013-04-06 LAB — OSMOLALITY, URINE: Osmolality, Ur: 565 mOsm/kg (ref 390–1090)

## 2013-04-06 LAB — GLUCOSE, CAPILLARY
Glucose-Capillary: 167 mg/dL — ABNORMAL HIGH (ref 70–99)
Glucose-Capillary: 126 mg/dL — ABNORMAL HIGH (ref 70–99)
Glucose-Capillary: 156 mg/dL — ABNORMAL HIGH (ref 70–99)

## 2013-04-06 LAB — URINE MICROSCOPIC-ADD ON

## 2013-04-06 LAB — BASIC METABOLIC PANEL
Calcium: 8.6 mg/dL (ref 8.4–10.5)
Chloride: 102 mEq/L (ref 96–112)
Creatinine, Ser: 1.57 mg/dL — ABNORMAL HIGH (ref 0.50–1.10)
GFR calc Af Amer: 38 mL/min — ABNORMAL LOW (ref >90)
GFR calc non Af Amer: 33 mL/min — ABNORMAL LOW (ref >90)

## 2013-04-06 LAB — LIPID PANEL: Cholesterol: 207 mg/dL — ABNORMAL HIGH (ref 0–200)

## 2013-04-06 LAB — URINALYSIS, ROUTINE W REFLEX MICROSCOPIC
Bilirubin Urine: NEGATIVE
Glucose, UA: NEGATIVE mg/dL
Hgb urine dipstick: NEGATIVE
Protein, ur: NEGATIVE mg/dL
Urobilinogen, UA: 1 mg/dL (ref 0.0–1.0)

## 2013-04-06 LAB — PROTIME-INR
INR: 2.46 — ABNORMAL HIGH (ref 0.00–1.49)
Prothrombin Time: 25.5 seconds — ABNORMAL HIGH (ref 11.6–15.2)

## 2013-04-06 LAB — TSH: TSH: 1.411 u[IU]/mL (ref 0.350–4.500)

## 2013-04-06 MED ORDER — LIVING WELL WITH DIABETES BOOK
Freq: Once | Status: AC
  Administered 2013-04-06: 17:00:00
  Filled 2013-04-06: qty 1

## 2013-04-06 MED ORDER — SODIUM CHLORIDE 0.9 % IV SOLN
INTRAVENOUS | Status: AC
  Administered 2013-04-06: 10:00:00 via INTRAVENOUS

## 2013-04-06 MED ORDER — INSULIN DETEMIR 100 UNIT/ML ~~LOC~~ SOLN
15.0000 [IU] | Freq: Every day | SUBCUTANEOUS | Status: DC
  Administered 2013-04-06: 15 [IU] via SUBCUTANEOUS
  Filled 2013-04-06 (×2): qty 0.15

## 2013-04-06 MED ORDER — WARFARIN - PHARMACIST DOSING INPATIENT: Freq: Every day | Status: DC

## 2013-04-06 MED ORDER — DEXTROSE 5 % IV SOLN
1.0000 g | INTRAVENOUS | Status: DC
  Administered 2013-04-06: 1 g via INTRAVENOUS
  Filled 2013-04-06 (×2): qty 10

## 2013-04-06 MED ORDER — METFORMIN HCL 500 MG PO TABS: 500.0000 mg | ORAL_TABLET | Freq: Two times a day (BID) | ORAL | Status: DC

## 2013-04-06 NOTE — Progress Notes (Signed)
Inpatient Diabetes Program Recommendations  AACE/ADA: New Consensus Statement on Inpatient Glycemic Control (2013)  Target Ranges:  Prepandial:   less than 140 mg/dL      Peak postprandial:   less than 180 mg/dL (1-2 hours)      Critically ill patients:  140 - 180 mg/dL   Reason for Visit: Results for MACRINA, LEHNERT (MRN [id number]) as of 04/06/2013 15:52  Ref. Range 04/12/2012 20:38 04/13/2012 07:39 04/05/2013 20:57 04/06/2013 07:07 04/06/2013 11:29  Glucose-Capillary Latest Range: 70-99 mg/dL 409 (H) 811 (H) 914 (H) 167 (H) 126 (H)   A1C=8.1% indicating sub-optimal glycemic control (A1C goal =7.0% or less according to ADA).  She states that she has not been on medications for diabetes for 4 years because her CBG's/A1C was normal.  In the past she was on 2 oral medications (one gave her diarrhea (?Metformin) and she also had low blood glucose with another pill).  Currently in the hospital patient in unable to take Metformin due to elevated creatinine.  Patient is currently on basal insulin (Levemir 15 units daily).  Patient states "I don't want to take shots".  Patient would likely do well with DPP4 inhibitor such as Tradgenta 5 mg daily (not renally cleared).  She also needs close follow-up with PCP Dr. Reche Dixon as she appears to have a trusting relationship with him as her provider.  Will order dietician consult for nutrition education regarding diabetes and also recommend outpatient diabetes follow-up education.  Called and discussed with Dr. Thedore Mins.  Will follow.

## 2013-04-06 NOTE — Progress Notes (Signed)
ANTICOAGULATION CONSULT NOTE - Initial Consult  Pharmacy Consult for coumadin Indication: atrial fibrillation  No Known Allergies  Patient Measurements: Height: 5\' 1"  (154.9 cm) Weight: 264 lb 11.2 oz (120.067 kg) (a scale) IBW/kg (Calculated) : 47.8  Vital Signs: Temp: 97.7 F (36.5 C) (06/18 1028) Temp src: Oral (06/18 1028) BP: 125/54 mmHg (06/18 1028) Pulse Rate: 58 (06/18 1028)  Labs:  Recent Labs  04/05/13 1355 04/05/13 1735 04/06/13 0459  HGB 12.6 12.4  --   HCT 38.1 38.0  --   PLT 302 280  --   LABPROT  --  23.4* 25.5*  INR  --  2.19* 2.46*  CREATININE 1.52* 1.46* 1.57*  TROPONINI <0.30  --   --     Estimated Creatinine Clearance: 40.9 ml/min (by C-G formula based on Cr of 1.57).   Medical History: Past Medical History  Diagnosis Date  . Atrial fibrillation /flutter   . Hypertension     a.  Renal arterial Dopplers 12/2011: 1-59% right renal artery stenosis  . Diabetes mellitus   . Hyperlipidemia   . Gout   . 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 Diastolic CHF, chronic     a.  echo 2006 - ef 55-65%; mild diast dysfxn;    b. Echo 08/2011: Mild LVH, EF 60%.   . Arthritis   . Morbid obesity   . OSA (obstructive sleep apnea)     uses cpap  . CHF (congestive heart failure)   . Back pain   . Chronic kidney disease    Assessment: 72 year old female with hx of afib on chronic coumadin. INR therapeutic at 2.4 this morning. Patients home dose has been resumed. No bleeding issues noted.  Home dose - 5mg  on Tues and Sat, take 2.5mg  all other days  Patient was being ruled out for stroke on admission and started on full dose aspirin, consider stopping or reducing dose to 81mg  as patient is also therapeutic on coumadin.  Goal of Therapy:  INR 2-3 Monitor platelets by anticoagulation protocol: Yes   Plan:  Continue home dose of warfarin  INR daily  Sheppard Coil PharmD., BCPS Clinical Pharmacist Pager [phone] 04/06/2013 11:16  AM

## 2013-04-06 NOTE — Progress Notes (Signed)
*  PRELIMINARY RESULTS* Vascular Ultrasound Carotid Duplex (Doppler) has been completed.  Preliminary findings: Bilateral:  Less than 39% ICA stenosis.  Vertebral artery flow is antegrade.      Farrel Demark, RDMS, RVT  04/06/2013, 10:03 AM

## 2013-04-06 NOTE — Care Management Note (Signed)
    Page 1 of 1   04/06/2013     10:58:25 AM   CARE MANAGEMENT NOTE 04/06/2013  Patient:  [name]   Account Number:  [id number]  Date Initiated:  04/06/2013  Documentation initiated by:  Oletta Cohn  Subjective/Objective Assessment:   72 year old female with a history of atrial fibrillation, congestive heart failure, hypertension, chronic kidney disease who presents to the ER with chief complaint of dizziness.     Action/Plan:   r/o stroke//Home with PT   Anticipated DC Date:  04/09/2013   Anticipated DC Plan:  HOME W HOME HEALTH SERVICES      DC Planning Services  CM consult      [facility] Choice  HOME HEALTH   Choice offered to / List presented to:  C-1 Patient        HH arranged  HH-2 PT      [facility] agency  [facility]   Status of service:  Completed, signed off Medicare Important Message given?   (If response is "NO", the following Medicare IM given date fields will be blank) Date Medicare IM given:   Date Additional Medicare IM given:    Discharge Disposition:    Per UR Regulation:    If discussed at Long Length of Stay Meetings, dates discussed:    Comments:  04/06/13.Marland KitchenMarland KitchenOletta Cohn, RN, BSN, [employer] [phone] Spoke with ot at bedside concerning discharge planning. Offered pt list of Home Health Agencies.  Pt familiar with [facility] and chose to have their services again. Kizzie Furnish of [facility] notified to set up HHPT.  No DME needs identified at this time.

## 2013-04-06 NOTE — Progress Notes (Signed)
Utilization Review Completed Terren Jandreau J. Tyliyah Mcmeekin, RN, BSN, NCM [phone]  

## 2013-04-06 NOTE — Progress Notes (Signed)
  Echocardiogram 2D Echocardiogram has been performed.  [name] 04/06/2013, 10:15 AM

## 2013-04-06 NOTE — Progress Notes (Addendum)
[facility]                                                                                Patient Demographics  [name], is a 72 y.o. female, DOB - [birth year], FAO:[id number], ONG:[id number]  Admit date - 04/05/2013  Admitting Physician Richarda Overlie, MD  Outpatient Primary MD for the patient is TALBOT, DAVID [name] Saner, MD  LOS - 1   Chief Complaint  Patient presents with  . Dizziness        Assessment & Plan    1. Dizziness likely related to dehydration, UTI causing leukocytosis and mild orthostasis. Patient feels much improved after IV fluids, empiric IV Rocephin for UTI started, follow urine cultures, continue to monitor orthostasis, increase activity. Head CT is unremarkable, MRI MRA brain does not show any acute changes, followup on TSH, echogram and carotid duplex.   2. History of chronic diastolic heart failure and atrial fibrillation. She stable from the standpoint, clinically compensated, currently in sinus rhythm, continue Coumadin dose per pharmacy with INR monitoring. Continue amiodarone at home dose.   3. Acute renal failure due to dehydration. Continue gentle IV fluids, her ACE inhibitor and Aldactone on hold along with Lasix. Renal ultrasound is stable, repeat BMP in the morning.   4. Hypertension. Pressure was lowered due to dehydration, diuretics along with ACE inhibitor and Catapres on hold. Monitor blood pressure on Norvasc at this time.   5. Elevated A1c history of type 2 diabetes mellitus. Patient will receive diabetic teaching, will initiate on Glucophage and Insulin.  Lab Results  Component Value Date   HGBA1C 8.1* 04/05/2013    CBG (last 3)   Recent Labs  04/05/13 2057 04/06/13 0707  GLUCAP 209* 167*     6. Hypothyroidism. Continue home dose Synthroid, check TSH.  Lab Results  Component Value Date   TSH 1.24 05/10/2012       Code Status: full  Family Communication: none present  Disposition Plan: home   Procedures  CT  scan of the head, MRI MRA brain, carotid duplex, echogram.   Consults      DVT Prophylaxis  coumadin  Lab Results  Component Value Date   PLT 280 04/05/2013    Medications  Scheduled Meds: . amiodarone  300 mg Oral BID  . amLODipine  5 mg Oral Daily  . aspirin  325 mg Oral Daily  . cefTRIAXone (ROCEPHIN)  IV  1 g Intravenous Q24H  . furosemide  20 mg Oral Daily  . insulin aspart  0-15 Units Subcutaneous TID WC  . insulin detemir  10 Units Subcutaneous QHS  . levothyroxine  100 mcg Oral QAC breakfast  . warfarin  2.5 mg Oral Custom  . warfarin  5 mg Oral Custom  . Warfarin - Physician Dosing Inpatient   Does not apply q1800   Continuous Infusions: . sodium chloride     PRN Meds:.  Antibiotics    Anti-infectives   Start     Dose/Rate Route Frequency Ordered Stop   04/06/13 1000  cefTRIAXone (ROCEPHIN) 1 g in dextrose 5 % 50 mL IVPB     1 g 100 mL/hr over 30 Minutes Intravenous Every 24 hours 04/06/13  0945         Time Spent in minutes   35   SINGH,PRASHANT K M.D on 04/06/2013 at 9:45 AM  Between 7am to 7pm - Pager - [phone]  After 7pm go to [email or web address] - password [email or web address]  And look for the night coverage person covering for me after hours  [facility] Office  [phone]    Subjective:   [name] today has, No headache, No chest pain, No abdominal pain - No Nausea, No new weakness tingling or numbness, No Cough - SOB.   Objective:   Filed Vitals:   04/05/13 2122 04/05/13 2333 04/06/13 0139 04/06/13 0521  BP: 133/61  116/57 115/67  Pulse: 62 99 58 59  Temp: 97.7 F (36.5 C)  97.5 F (36.4 C) 98.1 F (36.7 C)  TempSrc: Oral  Oral Oral  Resp: 18 24 22 20   Height:      Weight:    120.067 kg (264 lb 11.2 oz)  SpO2: 94% 94% 96% 94%    Wt Readings from Last 3 Encounters:  04/06/13 120.067 kg (264 lb 11.2 oz)  11/22/12 118.389 kg (261 lb)  11/11/12 119.296 kg (263 lb)     Intake/Output Summary (Last 24 hours) at  04/06/13 0945 Last data filed at 04/06/13 0814  Gross per 24 hour  Intake    600 ml  Output    350 ml  Net    250 ml    Exam Awake Alert, Oriented X 3, No new F.N deficits, Normal affect [name],PERRAL Supple Neck,No JVD, No cervical lymphadenopathy appriciated.  Symmetrical Chest wall movement, Good air movement bilaterally, CTAB RRR,No Gallops,Rubs or new Murmurs, No Parasternal Heave +ve B.Sounds, Abd Soft, Non tender, No organomegaly appriciated, No rebound - guarding or rigidity. No Cyanosis, Clubbing or edema, No new Rash or bruise      Data Review   Micro Results No results found for this or any previous visit (from the past 240 hour(s)).  Radiology Reports Dg Chest 2 View  04/05/2013   *RADIOLOGY REPORT*  Clinical Data: Cough.  Dizziness.  Congestive heart failure.  CHEST - 2 VIEW  Comparison: 05/13/2012  Findings: Mild cardiomegaly stable.  Pulmonary interstitial prominence is unchanged.  There is mild scarring seen in the left lung base.  No evidence of acute infiltrate or edema.  No evidence of pleural effusion.  No mass or lymphadenopathy identified.  IMPRESSION: Stable cardiomegaly and mild left basilar scarring.  No active disease.   Original Report Authenticated By: Myles Rosenthal, M.D.   Ct Head Wo Contrast  04/05/2013   *RADIOLOGY REPORT*  Clinical Data: Dizziness and headache.  CT HEAD WITHOUT CONTRAST  Technique:  Contiguous axial images were obtained from the base of the skull through the vertex without contrast.  Comparison: None.  Findings: The ventricles are normal.  No extra-axial fluid collections are seen.  The brainstem and cerebellum are unremarkable.  No acute intracranial findings such as infarction or hemorrhage.  No mass lesions.  The bony calvarium is intact.  The visualized paranasal sinuses and mastoid air cells are clear.  Small basal ganglia calcifications are noted.  Hyperostosis frontalis interna is present.  IMPRESSION: No acute intracranial findings or  mass lesion.   Original Report Authenticated By: Rudie Meyer, M.D.   Mri Brain Without Contrast  04/06/2013   *RADIOLOGY REPORT*  Clinical Data:  Dizziness.  Diabetic hypertensive patient with hyperlipidemia and history of atrial fibrillation.  MRI BRAIN WITHOUT CONTRAST MRA HEAD WITHOUT CONTRAST  Technique: Multiplanar, multiecho pulse sequences of the brain and surrounding structures were obtained according to standard protocol without intravenous contrast.  Angiographic images of the head were obtained using MRA technique without contrast.  Comparison: 04/05/2013 CT.  No comparison MR.  MRI HEAD  Findings:  No acute infarct.  No intracranial hemorrhage.  Moderate small vessel disease type changes.  Mild global atrophy most notable parietal lobes without evidence of hydrocephalus.  Major intracranial vascular structures are patent.  No intracranial mass lesion detected on this unenhanced exam.  Cerebellar tonsils minimally low-lying but within range normal limits.  Pituitary region, pineal region and orbital structures unremarkable.  IMPRESSION: No acute infarct.  Moderate small vessel disease type changes.  Mild global atrophy most notable parietal lobes without hydrocephalus.  MRA HEAD  Findings: Mild narrowing supraclinoid/cavernous junction of the internal carotid artery bilaterally.  High-grade focal narrowing proximal A1 segment of the left anterior cerebral artery.  Mild narrowing proximal A1 segments right anterior cerebral artery.  No significant stenosis of the basilar artery or distal vertebral arteries.  Nonvisualization PICAs.   Well visualized AICAs.  Mild narrowing proximal left superior cerebellar artery.  Mild narrowing distal right posterior cerebral artery branches.  No aneurysm or vascular malformation noted.  IMPRESSION: Mild intracranial atherosclerotic type changes as detailed above.   Original Report Authenticated By: Lacy Duverney, M.D.   US Renal  04/06/2013   *RADIOLOGY REPORT*   Clinical Data: Acute renal failure.  RENAL/URINARY TRACT ULTRASOUND COMPLETE  Comparison:  CT abdomen and pelvis 04/10/2012.  Findings:  Right Kidney:  Measures 10.2 cm and appears normal without stone, mass or hydronephrosis.  Right adrenal lesion consistent with an adenoma as seen on prior CT scan is noted.  Left Kidney:  Measures 10.2 cm and appears normal without stone, mass or hydronephrosis.  Bladder:  Unremarkable.  IMPRESSION:  1.  Normal-appearing kidneys.  Negative for hydronephrosis. 2.  Unchanged right adrenal adenoma.   Original Report Authenticated By: Holley Dexter, M.D.   Mr Mra Head/brain Wo Cm  04/06/2013   *RADIOLOGY REPORT*  Clinical Data:  Dizziness.  Diabetic hypertensive patient with hyperlipidemia and history of atrial fibrillation.  MRI BRAIN WITHOUT CONTRAST MRA HEAD WITHOUT CONTRAST  Technique: Multiplanar, multiecho pulse sequences of the brain and surrounding structures were obtained according to standard protocol without intravenous contrast.  Angiographic images of the head were obtained using MRA technique without contrast.  Comparison: 04/05/2013 CT.  No comparison MR.  MRI HEAD  Findings:  No acute infarct.  No intracranial hemorrhage.  Moderate small vessel disease type changes.  Mild global atrophy most notable parietal lobes without evidence of hydrocephalus.  Major intracranial vascular structures are patent.  No intracranial mass lesion detected on this unenhanced exam.  Cerebellar tonsils minimally low-lying but within range normal limits.  Pituitary region, pineal region and orbital structures unremarkable.  IMPRESSION: No acute infarct.  Moderate small vessel disease type changes.  Mild global atrophy most notable parietal lobes without hydrocephalus.  MRA HEAD  Findings: Mild narrowing supraclinoid/cavernous junction of the internal carotid artery bilaterally.  High-grade focal narrowing proximal A1 segment of the left anterior cerebral artery.  Mild narrowing  proximal A1 segments right anterior cerebral artery.  No significant stenosis of the basilar artery or distal vertebral arteries.  Nonvisualization PICAs.   Well visualized AICAs.  Mild narrowing proximal left superior cerebellar artery.  Mild narrowing distal right posterior cerebral artery branches.  No aneurysm or vascular malformation noted.  IMPRESSION: Mild intracranial atherosclerotic type changes  as detailed above.   Original Report Authenticated By: Lacy Duverney, M.D.    CBC  Recent Labs Lab 04/05/13 1355 04/05/13 1735  WBC 11.1* 12.0*  HGB 12.6 12.4  HCT 38.1 38.0  PLT 302 280  MCV 83.0 82.4  MCH 27.5 26.9  MCHC 33.1 32.6  RDW 15.9* 15.7*  LYMPHSABS 1.8  --   MONOABS 1.4*  --   EOSABS 0.3  --   BASOSABS 0.0  --     Chemistries   Recent Labs Lab 04/05/13 1355 04/05/13 1735 04/06/13 0459  NA 136  --  137  K 5.5*  --  4.6  CL 100  --  102  CO2 25  --  25  GLUCOSE 224*  --  173*  BUN 42*  --  42*  CREATININE 1.52* 1.46* 1.57*  CALCIUM 8.9  --  8.6  AST 47*  --   --   ALT 72*  --   --   ALKPHOS 120*  --   --   BILITOT 0.3  --   --    ------------------------------------------------------------------------------------------------------------------ estimated creatinine clearance is 40.9 ml/min (by C-G formula based on Cr of 1.57). ------------------------------------------------------------------------------------------------------------------  Recent Labs  04/05/13 1735  HGBA1C 8.1*   ------------------------------------------------------------------------------------------------------------------  Recent Labs  04/06/13 0459  CHOL 207*  HDL 58  LDLCALC 112*  TRIG 186*  CHOLHDL 3.6   ------------------------------------------------------------------------------------------------------------------ No results found for this basename: TSH, T4TOTAL, FREET3, T3FREE, THYROIDAB,  in the last 72  hours ------------------------------------------------------------------------------------------------------------------ No results found for this basename: VITAMINB12, FOLATE, FERRITIN, TIBC, IRON, RETICCTPCT,  in the last 72 hours  Coagulation profile  Recent Labs Lab 04/05/13 1735 04/06/13 0459  INR 2.19* 2.46*    No results found for this basename: DDIMER,  in the last 72 hours  Cardiac Enzymes  Recent Labs Lab 04/05/13 1355  TROPONINI <0.30   ------------------------------------------------------------------------------------------------------------------ No components found with this basename: POCBNP,

## 2013-04-06 NOTE — ED Provider Notes (Signed)
Medical screening examination/treatment/procedure(s) were performed by non-physician practitioner and as supervising physician I was immediately available for consultation/collaboration.   Laray Anger, DO 04/06/13 [phone]

## 2013-04-07 LAB — BASIC METABOLIC PANEL
BUN: 29 mg/dL — ABNORMAL HIGH (ref 6–23)
CO2: 25 mEq/L (ref 19–32)
Chloride: 105 mEq/L (ref 96–112)
Creatinine, Ser: 1.16 mg/dL — ABNORMAL HIGH (ref 0.50–1.10)
GFR calc Af Amer: 54 mL/min — ABNORMAL LOW (ref >90)
Potassium: 4.5 mEq/L (ref 3.5–5.1)

## 2013-04-07 LAB — PROTIME-INR: Prothrombin Time: 23.4 seconds — ABNORMAL HIGH (ref 11.6–15.2)

## 2013-04-07 LAB — GLUCOSE, CAPILLARY: Glucose-Capillary: 126 mg/dL — ABNORMAL HIGH (ref 70–99)

## 2013-04-07 MED ORDER — AMLODIPINE BESYLATE 5 MG PO TABS: 10.0000 mg | ORAL_TABLET | Freq: Every day | ORAL | Status: DC

## 2013-04-07 MED ORDER — LISINOPRIL 10 MG PO TABS: 10.0000 mg | ORAL_TABLET | Freq: Every day | ORAL | Status: DC

## 2013-04-07 MED ORDER — CLONIDINE HCL 0.2 MG PO TABS: 0.2000 mg | ORAL_TABLET | Freq: Three times a day (TID) | ORAL | Status: DC

## 2013-04-07 MED ORDER — CIPROFLOXACIN HCL 500 MG PO TABS: 500.0000 mg | ORAL_TABLET | Freq: Two times a day (BID) | ORAL | Status: DC

## 2013-04-07 NOTE — Progress Notes (Signed)
IV d/c'd.  Tele d/c'd.  Pt d/c'd to home.  Home meds and d/c instructions have been reviewed with pt.  Pt denies any questions or concerns at this time.  Pt leaving unit via wheelchair and appears in no acute distress.   Orvil Faraone RN 

## 2013-04-07 NOTE — Plan of Care (Signed)
Problem: Limited Adherence to Nutrition-Related Recommendations (NB-1.6) Goal: Nutrition education Formal process to instruct or train a patient/client in a skill or to impart knowledge to help patients/clients voluntarily manage or modify food choices and eating behavior to maintain or improve health. Outcome: Completed/Met Date Met:  04/07/13  RD consulted for nutrition education regarding diabetes.     Lab Results  Component Value Date    HGBA1C 8.1* 04/05/2013    RD provided "Carbohydrate Counting for People with Diabetes" handout from the Academy of [employer] and Dietetics. Discussed different food groups and their effects on blood sugar, emphasizing carbohydrate-containing foods. Provided list of carbohydrates and recommended serving sizes of common foods.  Discussed importance of controlled and consistent carbohydrate intake throughout the day. Provided examples of ways to balance meals/snacks and encouraged intake of high-fiber, whole grain complex carbohydrates. Teach back method used.  Pt reports that she recently went to see a nutritionist at her PCP's office. Reinforced teaching.   Expect fair compliance.  Body mass index is 50.04 kg/(m^2). Pt meets criteria for obesity class 3, extreme based on current BMI.  Current diet order is Carb Mod Medium, patient is consuming approximately 100% of meals at this time. Labs and medications reviewed. No further nutrition interventions warranted at this time. RD contact information provided. If additional nutrition issues arise, please re-consult RD.  Clarene Duke RD, LDN Pager [phone] After Hours pager [phone]

## 2013-04-07 NOTE — Progress Notes (Signed)
Attempted to contact the office of Dr. Reche Dixon to arrange a f/u appointment.  MD office is closed for lunch and does not have an answering service.  Discussed the importance of making a f/u appointment with pt.  Pt confirms she will call later on today. Nino Glow RN

## 2013-04-07 NOTE — Progress Notes (Signed)
ANTICOAGULATION CONSULT NOTE - Initial Consult  Pharmacy Consult for coumadin Indication: atrial fibrillation  No Known Allergies  Patient Measurements: Height: 5\' 1"  (154.9 cm) Weight: 264 lb 11.2 oz (120.067 kg) (scale a) IBW/kg (Calculated) : 47.8  Vital Signs: Temp: 98.1 F (36.7 C) (06/19 0503) Temp src: Oral (06/19 0503) BP: 176/89 mmHg (06/19 0944) Pulse Rate: 70 (06/19 0944)  Labs:  Recent Labs  04/05/13 1355 04/05/13 1735 04/06/13 0459 04/07/13 0500  HGB 12.6 12.4  --   --   HCT 38.1 38.0  --   --   PLT 302 280  --   --   LABPROT  --  23.4* 25.5* 23.4*  INR  --  2.19* 2.46* 2.19*  CREATININE 1.52* 1.46* 1.57* 1.16*  TROPONINI <0.30  --   --   --     Estimated Creatinine Clearance: 55.4 ml/min (by C-G formula based on Cr of 1.16).   Medical History: Past Medical History  Diagnosis Date  . Atrial fibrillation /flutter   . Hypertension     a.  Renal arterial Dopplers 12/2011: 1-59% right renal artery stenosis  . Diabetes mellitus   . Hyperlipidemia   . Gout   . 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 Diastolic CHF, chronic     a.  echo 2006 - ef 55-65%; mild diast dysfxn;    b. Echo 08/2011: Mild LVH, EF 60%.   . Arthritis   . Morbid obesity   . OSA (obstructive sleep apnea)     uses cpap  . CHF (congestive heart failure)   . Back pain   . Chronic kidney disease    Assessment: 72 year old female with hx of afib on chronic coumadin. INR therapeutic at 2.1 this morning. Patients home dose has been resumed. No bleeding issues noted.  Home dose - 5mg  on Tues and Sat, take 2.5mg  all other days  Goal of Therapy:  INR 2-3 Monitor platelets by anticoagulation protocol: Yes   Plan:  Continue home dose of warfarin  INR daily  Sheppard Coil PharmD., BCPS Clinical Pharmacist Pager [phone] 04/07/2013 11:07 AM

## 2013-04-07 NOTE — Discharge Summary (Signed)
[facility]                                                                                   [name], is a 72 y.o. female  DOB [birth year]  MRN [id number].  Admission date:  04/05/2013  Discharge Date:  04/07/2013  Primary MD  Mia Creek, MD  Admitting Physician  Richarda Overlie, MD  Admission Diagnosis  Dehydration [276.51] Renal insufficiency [593.9]  Discharge Diagnosis     Principal Problem:   Postural dizziness Active Problems:   DM   OBSTRUCTIVE SLEEP APNEA   HYPERTENSION   Chronic diastolic heart failure    Past Medical History  Diagnosis Date  . Atrial fibrillation /flutter   . Hypertension     a.  Renal arterial Dopplers 12/2011: 1-59% right renal artery stenosis  . Diabetes mellitus   . Hyperlipidemia   . Gout   . 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 Diastolic CHF, chronic     a.  echo 2006 - ef 55-65%; mild diast dysfxn;    b. Echo 08/2011: Mild LVH, EF 60%.   . Arthritis   . Morbid obesity   . OSA (obstructive sleep apnea)     uses cpap  . CHF (congestive heart failure)   . Back pain   . Chronic kidney disease     Past Surgical History  Procedure Laterality Date  . Cholecystectomy    . Total abdominal hysterectomy    . Appendectomy    . Tonsillectomy  1982  . Cardioversion  10/22/2011    Procedure: CARDIOVERSION;  Surgeon: Duke Salvia, MD;  Location: [facility] OR;  Service: Cardiovascular;  Laterality: N/A;     Recommendations for primary care physician for things to follow:   Follow BMP, blood pressure and patient's diabetes mellitus closely.  Please follow final urine culture results   Discharge Diagnoses:   Principal Problem:   Postural dizziness Active Problems:   DM   OBSTRUCTIVE SLEEP APNEA   HYPERTENSION   Chronic diastolic heart failure    Discharge Condition: Stable   Diet recommendation: See Discharge Instructions below   Consults diabetic education.    History of present  illness and  Hospital Course:     Kindly see H&P for history of present illness and admission details, please review complete Labs, Consult reports and Test reports for all details in brief [name], is a 72 y.o. female, patient with history of morbid obesity, obstructive sleep apnea wears CPAP at night, chronic diastolic dysfunction compensated this admission, hypertension, diabetes mellitus with A1c 8.1 (patient does not want to be placed on any diabetic medications and wants to discuss diabetes management with her PCP). Was admitted to the hospital with generalized weakness and lightheadedness due to dehydration along with UTI and acute renal failure, she was treated here with IV fluids, her ACE inhibitor along with her diuretics were held and she received empiric Rocephin with good effect. She is now symptom-free, renal function has improved, she is growing Escherichia coli in her urine with final sensitivities pending. I am placing her on empiric Cipro for one more day and will  request her to follow her final urine culture results with her primary care physician next visit.    For her hypertension I have adjusted her blood pressure medications and increase her Catapres and Norvasc dose and cut back her lisinopril dose, I have discontinued her diuretics due to her acute renal insufficiency and dehydration. We'll request PCP to continue monitoring her blood pressure and blood pressure medication needs closely.    Patient was checked for hemoglobin A1c which was 8.1, she received diabetic education, she told the person that she wishes not to be placed on any medications and she would like to discuss diabetes management with her PCP.    During admission due to her dizziness CVA workup was done, she underwent CT of the brain, MRI MRA of the brain along with echogram and carotid duplex which were all unremarkable.    Echo  - Left ventricle: The cavity size was normal. Wall thickness was  normal. Systolic function was normal. The estimated ejection fraction was in the range of 55% to 60%. - Aortic valve: Likely trileaflet but cannot tell for sure - Mitral valve: Calcified annulus. Mildly thickened leaflets. - Left atrium: The atrium was mildly dilated.    Carotids  Carotid Duplex (Doppler) has been completed. Preliminary findings: Bilateral: Less than 39% ICA stenosis. Vertebral artery flow is      Today   Subjective:   [name] today has no headache,no chest abdominal pain,no new weakness tingling or numbness, feels much better wants to go home today.    Objective:   Blood pressure 176/89, pulse 70, temperature 98.1 F (36.7 C), temperature source Oral, resp. rate 18, height 5\' 1"  (1.549 m), weight 120.067 kg (264 lb 11.2 oz), SpO2 93.00%.   Intake/Output Summary (Last 24 hours) at 04/07/13 1027 Last data filed at 04/07/13 0840  Gross per 24 hour  Intake    600 ml  Output   2990 ml  Net  -2390 ml    Exam Awake Alert, Oriented *3, No new F.N deficits, Normal affect Parker.AT,PERRAL Supple Neck,No JVD, No cervical lymphadenopathy appriciated.  Symmetrical Chest wall movement, Good air movement bilaterally, CTAB RRR,No Gallops,Rubs or new Murmurs, No Parasternal Heave +ve B.Sounds, Abd Soft, Non tender, No organomegaly appriciated, No rebound -guarding or rigidity. No Cyanosis, Clubbing or edema, No new Rash or bruise  Data Review   Major procedures and Radiology Reports - PLEASE review detailed and final reports for all details in brief -       Dg Chest 2 View  04/05/2013   *RADIOLOGY REPORT*  Clinical Data: Cough.  Dizziness.  Congestive heart failure.  CHEST - 2 VIEW  Comparison: 05/13/2012  Findings: Mild cardiomegaly stable.  Pulmonary interstitial prominence is unchanged.  There is mild scarring seen in the left lung base.  No evidence of acute infiltrate or edema.  No evidence of pleural effusion.  No mass or lymphadenopathy identified.   IMPRESSION: Stable cardiomegaly and mild left basilar scarring.  No active disease.   Original Report Authenticated By: Myles Rosenthal, M.D.   Ct Head Wo Contrast  04/05/2013   *RADIOLOGY REPORT*  Clinical Data: Dizziness and headache.  CT HEAD WITHOUT CONTRAST  Technique:  Contiguous axial images were obtained from the base of the skull through the vertex without contrast.  Comparison: None.  Findings: The ventricles are normal.  No extra-axial fluid collections are seen.  The brainstem and cerebellum are unremarkable.  No acute intracranial findings such as infarction or hemorrhage.  No mass  lesions.  The bony calvarium is intact.  The visualized paranasal sinuses and mastoid air cells are clear.  Small basal ganglia calcifications are noted.  Hyperostosis frontalis interna is present.  IMPRESSION: No acute intracranial findings or mass lesion.   Original Report Authenticated By: Rudie Meyer, M.D.   Mri Brain Without Contrast  04/06/2013   *RADIOLOGY REPORT*  Clinical Data:  Dizziness.  Diabetic hypertensive patient with hyperlipidemia and history of atrial fibrillation.  MRI BRAIN WITHOUT CONTRAST MRA HEAD WITHOUT CONTRAST  Technique: Multiplanar, multiecho pulse sequences of the brain and surrounding structures were obtained according to standard protocol without intravenous contrast.  Angiographic images of the head were obtained using MRA technique without contrast.  Comparison: 04/05/2013 CT.  No comparison MR.  MRI HEAD  Findings:  No acute infarct.  No intracranial hemorrhage.  Moderate small vessel disease type changes.  Mild global atrophy most notable parietal lobes without evidence of hydrocephalus.  Major intracranial vascular structures are patent.  No intracranial mass lesion detected on this unenhanced exam.  Cerebellar tonsils minimally low-lying but within range normal limits.  Pituitary region, pineal region and orbital structures unremarkable.  IMPRESSION: No acute infarct.  Moderate small  vessel disease type changes.  Mild global atrophy most notable parietal lobes without hydrocephalus.  MRA HEAD  Findings: Mild narrowing supraclinoid/cavernous junction of the internal carotid artery bilaterally.  High-grade focal narrowing proximal A1 segment of the left anterior cerebral artery.  Mild narrowing proximal A1 segments right anterior cerebral artery.  No significant stenosis of the basilar artery or distal vertebral arteries.  Nonvisualization PICAs.   Well visualized AICAs.  Mild narrowing proximal left superior cerebellar artery.  Mild narrowing distal right posterior cerebral artery branches.  No aneurysm or vascular malformation noted.  IMPRESSION: Mild intracranial atherosclerotic type changes as detailed above.   Original Report Authenticated By: Lacy Duverney, M.D.   US Renal  04/06/2013   *RADIOLOGY REPORT*  Clinical Data: Acute renal failure.  RENAL/URINARY TRACT ULTRASOUND COMPLETE  Comparison:  CT abdomen and pelvis 04/10/2012.  Findings:  Right Kidney:  Measures 10.2 cm and appears normal without stone, mass or hydronephrosis.  Right adrenal lesion consistent with an adenoma as seen on prior CT scan is noted.  Left Kidney:  Measures 10.2 cm and appears normal without stone, mass or hydronephrosis.  Bladder:  Unremarkable.  IMPRESSION:  1.  Normal-appearing kidneys.  Negative for hydronephrosis. 2.  Unchanged right adrenal adenoma.   Original Report Authenticated By: Holley Dexter, M.D.   Mr Mra Head/brain Wo Cm  04/06/2013   *RADIOLOGY REPORT*  Clinical Data:  Dizziness.  Diabetic hypertensive patient with hyperlipidemia and history of atrial fibrillation.  MRI BRAIN WITHOUT CONTRAST MRA HEAD WITHOUT CONTRAST  Technique: Multiplanar, multiecho pulse sequences of the brain and surrounding structures were obtained according to standard protocol without intravenous contrast.  Angiographic images of the head were obtained using MRA technique without contrast.  Comparison: 04/05/2013  CT.  No comparison MR.  MRI HEAD  Findings:  No acute infarct.  No intracranial hemorrhage.  Moderate small vessel disease type changes.  Mild global atrophy most notable parietal lobes without evidence of hydrocephalus.  Major intracranial vascular structures are patent.  No intracranial mass lesion detected on this unenhanced exam.  Cerebellar tonsils minimally low-lying but within range normal limits.  Pituitary region, pineal region and orbital structures unremarkable.  IMPRESSION: No acute infarct.  Moderate small vessel disease type changes.  Mild global atrophy most notable parietal lobes without hydrocephalus.  MRA  HEAD  Findings: Mild narrowing supraclinoid/cavernous junction of the internal carotid artery bilaterally.  High-grade focal narrowing proximal A1 segment of the left anterior cerebral artery.  Mild narrowing proximal A1 segments right anterior cerebral artery.  No significant stenosis of the basilar artery or distal vertebral arteries.  Nonvisualization PICAs.   Well visualized AICAs.  Mild narrowing proximal left superior cerebellar artery.  Mild narrowing distal right posterior cerebral artery branches.  No aneurysm or vascular malformation noted.  IMPRESSION: Mild intracranial atherosclerotic type changes as detailed above.   Original Report Authenticated By: Lacy Duverney, M.D.    Micro Results      Recent Results (from the past 240 hour(s))  URINE CULTURE     Status: None   Collection Time    04/06/13  8:40 AM      Result Value Range Status   Specimen Description URINE, RANDOM   Final   Special Requests NONE   Final   Culture  Setup Time 04/06/2013 14:54   Final   Colony Count >=100,000 COLONIES/ML   Final   Culture ESCHERICHIA COLI   Final   Report Status PENDING   Incomplete     CBC w Diff: Lab Results  Component Value Date   WBC 12.0* 04/05/2013   HGB 12.4 04/05/2013   HCT 38.0 04/05/2013   PLT 280 04/05/2013   LYMPHOPCT 17 04/05/2013   MONOPCT 13* 04/05/2013    EOSPCT 2 04/05/2013   BASOPCT 0 04/05/2013    CMP: Lab Results  Component Value Date   NA 139 04/07/2013   K 4.5 04/07/2013   CL 105 04/07/2013   CO2 25 04/07/2013   BUN 29* 04/07/2013   CREATININE 1.16* 04/07/2013   PROT 7.3 04/05/2013   ALBUMIN 3.3* 04/05/2013   BILITOT 0.3 04/05/2013   ALKPHOS 120* 04/05/2013   AST 47* 04/05/2013   ALT 72* 04/05/2013  .   Discharge Instructions     Follow with Primary MD TALBOT, DAVID C, MD in 4 days, he is discussed about her diabetes mellitus and its management with your family doctor. He wished not to be placed on any medications for now.   Follow your final urine culture results next visit  Get CBC, CMP, INR checked 4 days by Primary MD and again as instructed by your Primary MD.    Get Medicines reviewed and adjusted.  Please request your Prim.MD to go over all Hospital Tests and Procedure/Radiological results at the follow up, please get all Hospital records sent to your Prim MD by signing hospital release before you go home.  Activity: As tolerated with Full fall precautions use walker/cane & assistance as needed   Diet:  Heart healthy low carbohydrate  For Heart failure patients - Check your Weight same time everyday, if you gain over 2 pounds, or you develop in leg swelling, experience more shortness of breath or chest pain, call your Primary MD immediately. Follow Cardiac Low Salt Diet and 1.8 lit/day fluid restriction.  Disposition Home    If you experience worsening of your admission symptoms, develop shortness of breath, life threatening emergency, suicidal or homicidal thoughts you must seek medical attention immediately by calling 911 or calling your MD immediately  if symptoms less severe.  You Must read complete instructions/literature along with all the possible adverse reactions/side effects for all the Medicines you take and that have been prescribed to you. Take any new Medicines after you have completely understood and accpet  all the possible adverse reactions/side effects.  Do not drive and provide baby sitting services if your were admitted for syncope or siezures until you have seen by Primary MD or a Neurologist and advised to do so again.  Do not drive when taking Pain medications.    Do not take more than prescribed Pain, Sleep and Anxiety Medications  Special Instructions: If you have smoked or chewed Tobacco  in the last 2 yrs please stop smoking, stop any regular Alcohol  and or any Recreational drug use.  Wear Seat belts while driving.   Please note  You were cared for by a hospitalist during your hospital stay. If you have any questions about your discharge medications or the care you received while you were in the hospital after you are discharged, you can call the unit and asked to speak with the hospitalist on call if the hospitalist that took care of you is not available. Once you are discharged, your primary care physician will handle any further medical issues. Please note that NO REFILLS for any discharge medications will be authorized once you are discharged, as it is imperative that you return to your primary care physician (or establish a relationship with a primary care physician if you do not have one) for your aftercare needs so that they can reassess your need for medications and monitor your lab values.       Follow-up Information   Follow up with TALBOT, DAVID C, MD. Schedule an appointment as soon as possible for a visit in 4 days.   Contact information:   [address] [place] [address] [phone]         Discharge Medications     Medication List    STOP taking these medications       furosemide 40 MG tablet  Commonly known as:  LASIX     spironolactone 25 MG tablet  Commonly known as:  ALDACTONE      TAKE these medications       amiodarone 200 MG tablet  Commonly known as:  PACERONE  Take 1.5 tablets (300 mg total) by mouth 2 (two) times  daily.     amLODipine 5 MG tablet  Commonly known as:  NORVASC  Take 2 tablets (10 mg total) by mouth daily.     ciprofloxacin 500 MG tablet  Commonly known as:  CIPRO  Take 1 tablet (500 mg total) by mouth 2 (two) times daily.     cloNIDine 0.2 MG tablet  Commonly known as:  CATAPRES  Take 1 tablet (0.2 mg total) by mouth 3 (three) times daily.     levothyroxine 100 MCG tablet  Commonly known as:  SYNTHROID, LEVOTHROID  Take 1 tablet (100 mcg total) by mouth daily.     lisinopril 10 MG tablet  Commonly known as:  PRINIVIL,ZESTRIL  Take 1 tablet (10 mg total) by mouth daily.     warfarin 5 MG tablet  Commonly known as:  COUMADIN  Take 2.5-5 mg by mouth daily. Take 5mg  on Tues and Sat, take 2.5mg  all other days           Total Time in preparing paper work, data evaluation and todays exam - 35 minutes  Leroy Sea M.D on 04/07/2013 at 10:27 AM  [facility] Office  [phone]

## 2013-04-08 ENCOUNTER — Encounter: Payer: Self-pay | Admitting: *Deleted

## 2013-04-08 LAB — URINE CULTURE: Colony Count: >=100000

## 2013-04-12 ENCOUNTER — Telehealth: Payer: Self-pay | Admitting: Internal Medicine

## 2013-04-12 NOTE — Telephone Encounter (Signed)
[facility] from 6/17-6/19 for dehydration and renal insufficiency, UTI Patient states she has been in hospital and needs her INR checked, she states she was on some antibiotics in hospital,  dx dehydration and renal insufficiency.  hospital readings: 6/19-2.19- 0 6/18-2.46-2.5 mg coumadin 6/17-  0     -5 mg coumadin   Made patient an appt for tomorrow for INR check Discharged on  ciprofloxacin 500 MG tablet  Commonly known as: CIPRO  Take 1 tablet (500 mg total) by mouth 2 (two) times daily. , I think she has completed this med.

## 2013-04-12 NOTE — Telephone Encounter (Signed)
New Prob   Pt did not want to disclose information. Would like to speak to someone in coumadin clinic. Please call.

## 2013-04-13 ENCOUNTER — Ambulatory Visit (INDEPENDENT_AMBULATORY_CARE_PROVIDER_SITE_OTHER): Payer: Medicare Other | Admitting: *Deleted

## 2013-04-13 DIAGNOSIS — I4892 Unspecified atrial flutter: Secondary | ICD-10-CM

## 2013-04-13 DIAGNOSIS — I4891 Unspecified atrial fibrillation: Secondary | ICD-10-CM

## 2013-04-13 DIAGNOSIS — Z7901 Long term (current) use of anticoagulants: Secondary | ICD-10-CM

## 2013-04-19 ENCOUNTER — Telehealth: Payer: Self-pay | Admitting: Internal Medicine

## 2013-04-19 NOTE — Telephone Encounter (Signed)
New problem  Pt states that she is having a had time breathing at times.

## 2013-04-19 NOTE — Telephone Encounter (Signed)
Discussed with dr Graciela Husbands, pt to take 80 mg of furosemide now and twice daily for the next 2 days. Home health will go reassess on Thursday. The home health reports she is very concerned that the pt is not doing well. She will have a discussion with the pt regarding poss going to the ER.

## 2013-04-19 NOTE — Telephone Encounter (Signed)
Spoke with pt, she was in the hosp two weeks ago and her furosemide and spironolactone were both stopped. She saw her PCP Friday and he restarted 40 mg furosemide  Once daily. The home health nurse came out today, they walked her up and down the hallway and reported her 02 sat was fine. Her legs are swollen but elevating during the day helps. She is having to sleep with the head of her hosp bed elevated due to SOB. The home health nurse reported her lungs were clear. Her PCP called her today and told her to take 40 mg of furosemide now and then again in 2 hrs and then again in 2 hours. She wanted to talk to [place] about this. Will discuss with dr Graciela Husbands.

## 2013-04-19 NOTE — Telephone Encounter (Signed)
New Prob     Gained about 10 lb in one week, difficulty breathing, dry cough, fluid of lungs. Pt is currently on LASIX per PCP, but no change has occurred. Would like to speak to nurse regarding this. Please call.

## 2013-04-21 ENCOUNTER — Emergency Department (HOSPITAL_COMMUNITY): Payer: Medicare Other

## 2013-04-21 ENCOUNTER — Encounter (HOSPITAL_COMMUNITY): Payer: Self-pay | Admitting: Family Medicine

## 2013-04-21 ENCOUNTER — Inpatient Hospital Stay (HOSPITAL_COMMUNITY)
Admission: EM | Admit: 2013-04-21 | Discharge: 2013-04-24 | DRG: 194 | Disposition: A | Discharge status: Home / Self Care | Payer: Medicare Other | Attending: Internal Medicine | Admitting: Internal Medicine

## 2013-04-21 DIAGNOSIS — Z79899 Other long term (current) drug therapy: Secondary | ICD-10-CM

## 2013-04-21 DIAGNOSIS — M109 Gout, unspecified: Secondary | ICD-10-CM | POA: Diagnosis present

## 2013-04-21 DIAGNOSIS — G4733 Obstructive sleep apnea (adult) (pediatric): Secondary | ICD-10-CM | POA: Diagnosis present

## 2013-04-21 DIAGNOSIS — E119 Type 2 diabetes mellitus without complications: Secondary | ICD-10-CM | POA: Diagnosis present

## 2013-04-21 DIAGNOSIS — I509 Heart failure, unspecified: Secondary | ICD-10-CM | POA: Diagnosis present

## 2013-04-21 DIAGNOSIS — I5032 Chronic diastolic (congestive) heart failure: Secondary | ICD-10-CM | POA: Diagnosis present

## 2013-04-21 DIAGNOSIS — J189 Pneumonia, unspecified organism: Principal | ICD-10-CM | POA: Diagnosis present

## 2013-04-21 DIAGNOSIS — E1159 Type 2 diabetes mellitus with other circulatory complications: Secondary | ICD-10-CM | POA: Diagnosis present

## 2013-04-21 DIAGNOSIS — I152 Hypertension secondary to endocrine disorders: Secondary | ICD-10-CM | POA: Diagnosis present

## 2013-04-21 DIAGNOSIS — I480 Paroxysmal atrial fibrillation: Secondary | ICD-10-CM | POA: Diagnosis present

## 2013-04-21 DIAGNOSIS — I129 Hypertensive chronic kidney disease with stage 1 through stage 4 chronic kidney disease, or unspecified chronic kidney disease: Secondary | ICD-10-CM | POA: Diagnosis present

## 2013-04-21 DIAGNOSIS — E785 Hyperlipidemia, unspecified: Secondary | ICD-10-CM | POA: Diagnosis present

## 2013-04-21 DIAGNOSIS — I4891 Unspecified atrial fibrillation: Secondary | ICD-10-CM | POA: Diagnosis present

## 2013-04-21 DIAGNOSIS — N189 Chronic kidney disease, unspecified: Secondary | ICD-10-CM | POA: Diagnosis present

## 2013-04-21 DIAGNOSIS — I10 Essential (primary) hypertension: Secondary | ICD-10-CM

## 2013-04-21 DIAGNOSIS — Z90710 Acquired absence of both cervix and uterus: Secondary | ICD-10-CM

## 2013-04-21 DIAGNOSIS — I4892 Unspecified atrial flutter: Secondary | ICD-10-CM | POA: Diagnosis present

## 2013-04-21 DIAGNOSIS — M129 Arthropathy, unspecified: Secondary | ICD-10-CM | POA: Diagnosis present

## 2013-04-21 DIAGNOSIS — Z7901 Long term (current) use of anticoagulants: Secondary | ICD-10-CM

## 2013-04-21 LAB — GLUCOSE, CAPILLARY: Glucose-Capillary: 185 mg/dL — ABNORMAL HIGH (ref 70–99)

## 2013-04-21 LAB — BASIC METABOLIC PANEL
BUN: 25 mg/dL — ABNORMAL HIGH (ref 6–23)
CO2: 28 mEq/L (ref 19–32)
Chloride: 97 mEq/L (ref 96–112)
Creatinine, Ser: 1.18 mg/dL — ABNORMAL HIGH (ref 0.50–1.10)

## 2013-04-21 LAB — CBC
HCT: 37.2 % (ref 36.0–46.0)
MCV: 83.4 fL (ref 78.0–100.0)
RBC: 4.46 MIL/uL (ref 3.87–5.11)
WBC: 13.5 10*3/uL — ABNORMAL HIGH (ref 4.0–10.5)

## 2013-04-21 LAB — PROTIME-INR: INR: 1.77 — ABNORMAL HIGH (ref 0.00–1.49)

## 2013-04-21 MED ORDER — LEVOTHYROXINE SODIUM 100 MCG PO TABS
100.0000 ug | ORAL_TABLET | Freq: Every day | ORAL | Status: DC
  Administered 2013-04-22 – 2013-04-24 (×3): 100 ug via ORAL
  Filled 2013-04-21 (×5): qty 1

## 2013-04-21 MED ORDER — CLONIDINE HCL 0.2 MG PO TABS
0.2000 mg | ORAL_TABLET | Freq: Three times a day (TID) | ORAL | Status: DC
  Filled 2013-04-21: qty 1

## 2013-04-21 MED ORDER — AMLODIPINE BESYLATE 10 MG PO TABS
10.0000 mg | ORAL_TABLET | Freq: Every day | ORAL | Status: DC
Start: 2013-04-21 — End: 2013-04-24
  Administered 2013-04-21 – 2013-04-24 (×4): 10 mg via ORAL
  Filled 2013-04-21 (×4): qty 1

## 2013-04-21 MED ORDER — VANCOMYCIN HCL 10 G IV SOLR
1500.0000 mg | INTRAVENOUS | Status: DC
  Filled 2013-04-21: qty 1500

## 2013-04-21 MED ORDER — CEFTRIAXONE SODIUM 1 G IJ SOLR
1.0000 g | INTRAMUSCULAR | Status: DC
  Administered 2013-04-21: 1 g via INTRAVENOUS
  Filled 2013-04-21: qty 10

## 2013-04-21 MED ORDER — INSULIN ASPART 100 UNIT/ML ~~LOC~~ SOLN
0.0000 [IU] | Freq: Three times a day (TID) | SUBCUTANEOUS | Status: DC
  Administered 2013-04-22: 1 [IU] via SUBCUTANEOUS
  Administered 2013-04-22: 2 [IU] via SUBCUTANEOUS
  Administered 2013-04-22 – 2013-04-23 (×4): 1 [IU] via SUBCUTANEOUS
  Administered 2013-04-24: 2 [IU] via SUBCUTANEOUS

## 2013-04-21 MED ORDER — CLONIDINE HCL 0.2 MG PO TABS
0.2000 mg | ORAL_TABLET | Freq: Three times a day (TID) | ORAL | Status: DC
  Administered 2013-04-22 – 2013-04-24 (×5): 0.2 mg via ORAL
  Filled 2013-04-21 (×9): qty 1

## 2013-04-21 MED ORDER — ACETAMINOPHEN 325 MG PO TABS
650.0000 mg | ORAL_TABLET | Freq: Four times a day (QID) | ORAL | Status: DC | PRN
  Administered 2013-04-22 – 2013-04-23 (×3): 650 mg via ORAL
  Filled 2013-04-21 (×4): qty 2

## 2013-04-21 MED ORDER — LISINOPRIL 10 MG PO TABS
10.0000 mg | ORAL_TABLET | Freq: Every day | ORAL | Status: DC
  Filled 2013-04-21: qty 1

## 2013-04-21 MED ORDER — AMIODARONE HCL 200 MG PO TABS
300.0000 mg | ORAL_TABLET | Freq: Two times a day (BID) | ORAL | Status: DC
  Administered 2013-04-21 – 2013-04-24 (×6): 300 mg via ORAL
  Filled 2013-04-21 (×7): qty 1

## 2013-04-21 MED ORDER — DEXTROSE 5 % IV SOLN
2.0000 g | INTRAVENOUS | Status: DC
  Administered 2013-04-22: 2 g via INTRAVENOUS
  Filled 2013-04-21 (×2): qty 2

## 2013-04-21 MED ORDER — VANCOMYCIN HCL 10 G IV SOLR
2000.0000 mg | Freq: Once | INTRAVENOUS | Status: AC
  Administered 2013-04-21: 2000 mg via INTRAVENOUS
  Filled 2013-04-21: qty 2000

## 2013-04-21 MED ORDER — LEVOFLOXACIN IN D5W 750 MG/150ML IV SOLN
750.0000 mg | INTRAVENOUS | Status: DC
  Administered 2013-04-22 – 2013-04-23 (×3): 750 mg via INTRAVENOUS
  Filled 2013-04-21 (×4): qty 150

## 2013-04-21 MED ORDER — ONDANSETRON HCL 4 MG/2ML IJ SOLN: 4.0000 mg | Freq: Four times a day (QID) | INTRAMUSCULAR | Status: DC | PRN

## 2013-04-21 MED ORDER — SODIUM CHLORIDE 0.9 % IJ SOLN
3.0000 mL | Freq: Two times a day (BID) | INTRAMUSCULAR | Status: DC
  Administered 2013-04-22 – 2013-04-24 (×5): 3 mL via INTRAVENOUS

## 2013-04-21 MED ORDER — ACETAMINOPHEN 650 MG RE SUPP: 650.0000 mg | Freq: Four times a day (QID) | RECTAL | Status: DC | PRN

## 2013-04-21 MED ORDER — ONDANSETRON HCL 4 MG PO TABS: 4.0000 mg | ORAL_TABLET | Freq: Four times a day (QID) | ORAL | Status: DC | PRN

## 2013-04-21 MED ORDER — LISINOPRIL 10 MG PO TABS
10.0000 mg | ORAL_TABLET | Freq: Every day | ORAL | Status: DC
  Administered 2013-04-22 – 2013-04-24 (×3): 10 mg via ORAL
  Filled 2013-04-21 (×3): qty 1

## 2013-04-21 MED ORDER — FUROSEMIDE 40 MG PO TABS
40.0000 mg | ORAL_TABLET | Freq: Two times a day (BID) | ORAL | Status: DC
  Administered 2013-04-22 – 2013-04-24 (×5): 40 mg via ORAL
  Filled 2013-04-21 (×7): qty 1

## 2013-04-21 MED ORDER — SODIUM CHLORIDE 0.9 % IJ SOLN
3.0000 mL | Freq: Two times a day (BID) | INTRAMUSCULAR | Status: DC
  Administered 2013-04-22: 3 mL via INTRAVENOUS

## 2013-04-21 MED ORDER — DEXTROSE 5 % IV SOLN
500.0000 mg | INTRAVENOUS | Status: DC
  Administered 2013-04-21: 500 mg via INTRAVENOUS
  Filled 2013-04-21 (×2): qty 500

## 2013-04-21 NOTE — ED Notes (Signed)
Pt ambulated to restroom. 

## 2013-04-21 NOTE — ED Notes (Signed)
Diet tray ordered 

## 2013-04-21 NOTE — ED Provider Notes (Signed)
History    CSN: [id number] Arrival date & time 04/21/13  1509  First MD Initiated Contact with Patient 04/21/13 1607     Chief Complaint  Patient presents with  . Cough  . Shortness of Breath   (Consider location/radiation/quality/duration/timing/severity/associated sxs/prior Treatment) Patient is a 72 y.o. female presenting with cough and shortness of breath. The history is provided by the patient.  Cough Associated symptoms: shortness of breath   Shortness of Breath Associated symptoms: cough   She has been on an increased dose of furosemide because of recent weight gain. She says that she had gained 3 pounds over the previous weekend. Furosemide had been stopped during recent hospitalization and was restarted and then for the last 2 days she's been taking 160 mg a day. Yesterday, she started having a cough which is productive of greenish sputum. Cough has been worse today. She complains of dyspnea only when she is coughing. She denies orthopnea and denies chest pain, heaviness, tightness, pressure. She's not noticed any leg swelling. She denies fever or chills but has had some sweats. She denies nausea, vomiting, diarrhea. Past Medical History  Diagnosis Date  . Atrial fibrillation /flutter   . Hypertension     a.  Renal arterial Dopplers 12/2011: 1-59% right renal artery stenosis  . Diabetes mellitus   . Hyperlipidemia   . Gout   . 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 Diastolic CHF, chronic     a.  echo 2006 - ef 55-65%; mild diast dysfxn;    b. Echo 08/2011: Mild LVH, EF 60%.   . Arthritis   . Morbid obesity   . OSA (obstructive sleep apnea)     uses cpap  . CHF (congestive heart failure)   . Back pain   . Chronic kidney disease    Past Surgical History  Procedure Laterality Date  . Cholecystectomy    . Total abdominal hysterectomy    . Appendectomy    . Tonsillectomy  1982  . Cardioversion  10/22/2011    Procedure: CARDIOVERSION;  Surgeon:  Duke Salvia, MD;  Location: [facility] OR;  Service: Cardiovascular;  Laterality: N/A;   Family History  Problem Relation Age of Onset  . Heart disease Father   . Hypertension Father   . Breast cancer Sister   . Cancer Sister     breast   History  Substance Use Topics  . Smoking status: Never Smoker   . Smokeless tobacco: Never Used  . Alcohol Use: No   OB History   Grav Para Term Preterm Abortions TAB SAB Ect Mult Living                 Review of Systems  Respiratory: Positive for cough and shortness of breath.   All other systems reviewed and are negative.    Allergies  Review of patient's allergies indicates no known allergies.  Home Medications   Current Outpatient Rx  Name  Route  Sig  Dispense  Refill  . amiodarone (PACERONE) 200 MG tablet   Oral   Take 1.5 tablets (300 mg total) by mouth 2 (two) times daily.   90 tablet   3   . amLODipine (NORVASC) 5 MG tablet   Oral   Take 2 tablets (10 mg total) by mouth daily.   30 tablet   0   . ciprofloxacin (CIPRO) 500 MG tablet   Oral   Take 1 tablet (500 mg total) by mouth  2 (two) times daily.   2 tablet   0   . cloNIDine (CATAPRES) 0.2 MG tablet   Oral   Take 1 tablet (0.2 mg total) by mouth 3 (three) times daily.   60 tablet   1   . EXPIRED: levothyroxine (SYNTHROID, LEVOTHROID) 100 MCG tablet   Oral   Take 1 tablet (100 mcg total) by mouth daily.   90 tablet   3   . lisinopril (PRINIVIL,ZESTRIL) 10 MG tablet   Oral   Take 1 tablet (10 mg total) by mouth daily.   30 tablet   0   . warfarin (COUMADIN) 5 MG tablet   Oral   Take 2.5-5 mg by mouth daily. Take 5mg  on Tues and Sat, take 2.5mg  all other days          BP 110/37  Pulse 60  Temp(Src) 97.9 F (36.6 C) (Oral)  Resp 20  SpO2 95% Physical Exam  Nursing note and vitals reviewed.  72 year old female, resting comfortably and in no acute distress. Vital signs are normal. Oxygen saturation is 95%, which is normal. Head is normocephalic  and atraumatic. PERRLA, EOMI. Oropharynx is clear. Neck is nontender and supple without adenopathy or JVD. Back is nontender and there is no CVA tenderness. Lungs have faint rales at both bases. There are no wheezes or rhonchi. Chest is nontender. Heart has regular rate and rhythm without murmur. Abdomen is soft, flat, nontender without masses or hepatosplenomegaly and peristalsis is normoactive. Extremities have 1+ edema, full range of motion is present. Skin is warm and dry without rash. Neurologic: Mental status is normal, cranial nerves are intact, there are no motor or sensory deficits.  ED Course  Procedures (including critical care time) Results for orders placed during the hospital encounter of 04/21/13  CBC      Result Value Range   WBC 13.5 (*) 4.0 - 10.5 K/uL   RBC 4.46  3.87 - 5.11 MIL/uL   Hemoglobin 12.2  12.0 - 15.0 g/dL   HCT 16.1  09.6 - 04.5 %   MCV 83.4  78.0 - 100.0 fL   MCH 27.4  26.0 - 34.0 pg   MCHC 32.8  30.0 - 36.0 g/dL   RDW 40.9  81.1 - 91.4 %   Platelets 385  150 - 400 K/uL  BASIC METABOLIC PANEL      Result Value Range   Sodium 138  135 - 145 mEq/L   Potassium 3.6  3.5 - 5.1 mEq/L   Chloride 97  96 - 112 mEq/L   CO2 28  19 - 32 mEq/L   Glucose, Bld 186 (*) 70 - 99 mg/dL   BUN 25 (*) 6 - 23 mg/dL   Creatinine, Ser 7.82 (*) 0.50 - 1.10 mg/dL   Calcium 8.9  8.4 - 95.6 mg/dL   GFR calc non Af Amer 46 (*) >90 mL/min   GFR calc Af Amer 53 (*) >90 mL/min  PRO B NATRIURETIC PEPTIDE      Result Value Range   Pro B Natriuretic peptide (BNP) 308.3 (*) 0 - 125 pg/mL  POCT I-STAT TROPONIN I      Result Value Range   Troponin i, poc 0.00  0.00 - 0.08 ng/mL   Comment 3            Dg Chest 2 View  04/21/2013   *RADIOLOGY REPORT*  Clinical Data: Cough and shortness of breath.  Left-sided rib pain.  CHEST - 2 VIEW  Comparison: Chest x-ray dated 04/05/2013  Findings: The patient has patchy bilateral pulmonary infiltrates in the perihilar regions in the right  upper lobe as well as posteriorly on the lateral view.  Chronic mild cardiomegaly. Pulmonary vascularity is normal.  No acute osseous abnormality.  IMPRESSION: Bilateral pulmonary infiltrates.   Original Report Authenticated By: Francene Boyers, M.D.    Images viewed by me.   Date: 04/21/2013  Rate: 60  Rhythm: normal sinus rhythm  QRS Axis: normal  Intervals: normal  ST/T Wave abnormalities: normal  Conduction Disutrbances:none  Narrative Interpretation: Low voltage. When compared with ECG of 04/05/2012, no significant changes are seen.  Old EKG Reviewed: unchanged   1. Community acquired pneumonia     MDM  Cough and dyspnea which is either a bronchitis or pneumonia. Old records are reviewed and she actually was started on the high dose of furosemide because of a 10 pound weight gain separate from the 3 pounds that she gained over the weekend. Chest x-ray looks more like CHF to me and then pneumonia although she does have an elevated WBC. BNP is only slightly elevated over baseline so she will be treated as community-acquired pneumonia. Case is discussed with Dr. Toniann Fail of [facility] who agrees to admit the patient.  Dione Booze, MD 04/21/13 [phone]

## 2013-04-21 NOTE — ED Notes (Signed)
MD at bedside. 

## 2013-04-21 NOTE — H&P (Addendum)
[facility] History and Physical  Verania Salberg GMW:[id number] DOB: [birth year] DOA: 04/21/2013  Referring physician: ER physician. PCP: Mia Creek, MD  Specialists: Dr. Clide Cliff. Cardiologist.  Chief Complaint: Cough.  HPI: [name] is a 72 y.o. female presented the ER because of persistent cough patient has been having for last 3 days. Patient was recently admitted for acute failure probably from dehydration and at that time patient's Lasix was held. Patient's Lasix was restarted recently and due to the cough she had called her cardiologist who had increased her Lasix to 80 twice a day 2 days ago. Despite taking which patient was having persistent cough which was nonproductive. In the ER chest x-ray shows bilateral infiltrates concerning for pneumonia and given her leukocytosis at this time patient has been admitted for possible pneumonia. Patient denies any chest pain or shortness of breath. Patient states that her last few days her weight had increased by 4 pounds. BNP level is around 300.  Review of Systems: As presented in the history of presenting illness, rest negative.  Past Medical History  Diagnosis Date  . Atrial fibrillation /flutter   . Hypertension     a.  Renal arterial Dopplers 12/2011: 1-59% right renal artery stenosis  . Diabetes mellitus   . Hyperlipidemia   . Gout   . 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 Diastolic CHF, chronic     a.  echo 2006 - ef 55-65%; mild diast dysfxn;    b. Echo 08/2011: Mild LVH, EF 60%.   . Arthritis   . Morbid obesity   . OSA (obstructive sleep apnea)     uses cpap  . CHF (congestive heart failure)   . Back pain   . Chronic kidney disease    Past Surgical History  Procedure Laterality Date  . Cholecystectomy    . Total abdominal hysterectomy    . Appendectomy    . Tonsillectomy  1982  . Cardioversion  10/22/2011    Procedure: CARDIOVERSION;  Surgeon: Duke Salvia, MD;  Location: [facility] OR;   Service: Cardiovascular;  Laterality: N/A;   Social History:  reports that she has never smoked. She has never used smokeless tobacco. She reports that she does not drink alcohol or use illicit drugs. Home. where does patient live-- Can do ADLs. Can patient participate in ADLs?  No Known Allergies  Family History  Problem Relation Age of Onset  . Heart disease Father   . Hypertension Father   . Breast cancer Sister   . Cancer Sister     breast      Prior to Admission medications   Medication Sig Start Date End Date Taking? Authorizing Provider  amiodarone (PACERONE) 200 MG tablet Take 1.5 tablets (300 mg total) by mouth 2 (two) times daily. 02/08/13  Yes Duke Salvia, MD  amLODipine (NORVASC) 5 MG tablet Take 2 tablets (10 mg total) by mouth daily. 04/07/13  Yes Leroy Sea, MD  cloNIDine (CATAPRES) 0.2 MG tablet Take 1 tablet (0.2 mg total) by mouth 3 (three) times daily. 04/07/13 04/07/14 Yes Leroy Sea, MD  levothyroxine (SYNTHROID, LEVOTHROID) 100 MCG tablet Take 100 mcg by mouth daily before breakfast.   Yes Historical Provider, MD  lisinopril (PRINIVIL,ZESTRIL) 10 MG tablet Take 1 tablet (10 mg total) by mouth daily. 04/07/13  Yes Leroy Sea, MD  warfarin (COUMADIN) 5 MG tablet Take 2.5-5 mg by mouth every evening. Take 5mg  on Tues and Sat, take  2.5mg  all other days   Yes Historical Provider, MD   Physical Exam: Filed Vitals:   04/21/13 1519 04/21/13 1845  BP: 110/37 113/52  Pulse: 60 55  Temp: 97.9 F (36.6 C)   TempSrc: Oral   Resp: 20 26  SpO2: 95% 96%     General:  Well-developed and nourished.  Eyes: Anicteric no pallor.  ENT: No discharge from the ears eyes nose mouth.  Neck: No mass felt.  Cardiovascular: S1-S2 heard.  Respiratory: No rhonchi or crepitations.  Abdomen: Soft nontender bowel sounds present.  Skin: No rash.  Musculoskeletal: No edema.  Psychiatric: Appears normal.  Neurologic: Alert awake oriented to time place and  person. Moves all extremities.  Labs on Admission:  Basic Metabolic Panel:  Recent Labs Lab 04/21/13 1518  NA 138  K 3.6  CL 97  CO2 28  GLUCOSE 186*  BUN 25*  CREATININE 1.18*  CALCIUM 8.9   Liver Function Tests: No results found for this basename: AST, ALT, ALKPHOS, BILITOT, PROT, ALBUMIN,  in the last 168 hours No results found for this basename: LIPASE, AMYLASE,  in the last 168 hours No results found for this basename: AMMONIA,  in the last 168 hours CBC:  Recent Labs Lab 04/21/13 1518  WBC 13.5*  HGB 12.2  HCT 37.2  MCV 83.4  PLT 385   Cardiac Enzymes: No results found for this basename: CKTOTAL, CKMB, CKMBINDEX, TROPONINI,  in the last 168 hours  BNP (last 3 results)  Recent Labs  04/05/13 1355 04/21/13 1518  PROBNP 188.6* 308.3*   CBG: No results found for this basename: GLUCAP,  in the last 168 hours  Radiological Exams on Admission: Dg Chest 2 View  04/21/2013   *RADIOLOGY REPORT*  Clinical Data: Cough and shortness of breath.  Left-sided rib pain.  CHEST - 2 VIEW  Comparison: Chest x-ray dated 04/05/2013  Findings: The patient has patchy bilateral pulmonary infiltrates in the perihilar regions in the right upper lobe as well as posteriorly on the lateral view.  Chronic mild cardiomegaly. Pulmonary vascularity is normal.  No acute osseous abnormality.  IMPRESSION: Bilateral pulmonary infiltrates.   Original Report Authenticated By: Francene Boyers, M.D.    EKG: Independently reviewed. Normal sinus rhythm.  Assessment/Plan Principal Problem:   Pneumonia Active Problems:   OBSTRUCTIVE SLEEP APNEA   HYPERTENSION   ATRIAL FIBRILLATION, PAROXYSMAL   CHF (congestive heart failure)   1. Possible pneumonia - patient has been placed on empiric antibiotics for possible health care associated pneumonia. Check procalcitonin levels. If normal may then taper of antibiotics and symptoms may probably related to CHF. 2. CHF - I have placed patient on Lasix 40  twice a day orally. Closely follow intake output metabolic panel and clinically. 3. Atrial fibrillation - rate controlled presently in sinus rhythm. Coumadin per pharmacy. 4. OSA - CPAP respiratory. 5. Diabetes mellitus type 2 - not on medications. Closely follow CBGs with sliding-scale.  Addendum - patient's procalcitonin levels have come as normal. At this time I am going to hold off vancomycin and cefepime but continue Levaquin and closely follow clinically.  Code Status: Full code.  Family Communication: None.  Disposition Plan: Admit to inpatient.    Navi Ewton N. [facility] Pager [phone].  If 7PM-7AM, please contact night-coverage [email or web address] Password [email or web address] 04/21/2013, 8:02 PM

## 2013-04-21 NOTE — ED Notes (Signed)
Per pt sts cough and SOB since yesterday. sts was sent here by her doctor with possible fluid in lung. sts hx of CHF. sts more SOB when walking and lying flat.

## 2013-04-21 NOTE — Progress Notes (Signed)
MEDICATION RELATED CONSULT NOTE - INITIAL   Pharmacy Consult for vancomycin, cefepime, levaquin, coumadin Indication: PNA, afib  No Known Allergies  Patient Measurements: Wt= 120kg  Vital Signs: Temp: 97.9 F (36.6 C) (07/03 1519) Temp src: Oral (07/03 1519) BP: 113/52 mmHg (07/03 1845) Pulse Rate: 55 (07/03 1845) Intake/Output from previous day:   Intake/Output from this shift:    Labs:  Recent Labs  04/21/13 1518  WBC 13.5*  HGB 12.2  HCT 37.2  PLT 385  CREATININE 1.18*   The CrCl is unknown because both a height and weight (above a minimum accepted value) are required for this calculation.   Medical History: Past Medical History  Diagnosis Date  . Atrial fibrillation /flutter   . Hypertension     a.  Renal arterial Dopplers 12/2011: 1-59% right renal artery stenosis  . Diabetes mellitus   . Hyperlipidemia   . Gout   . 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 Diastolic CHF, chronic     a.  echo 2006 - ef 55-65%; mild diast dysfxn;    b. Echo 08/2011: Mild LVH, EF 60%.   . Arthritis   . Morbid obesity   . OSA (obstructive sleep apnea)     uses cpap  . CHF (congestive heart failure)   . Back pain   . Chronic kidney disease      Assessment: 72 yo female here with SOB and concern for PNA.  Pharmacy has been asked to dose levaquin, vancomycin and cefepime.  WBC= 13.4, SCr= 1.18 and CrCl ~50-60.  Patient also noted with history of afib on coumadin at home at to continue while admitted  -Home coumadin dose: 2.5mg /day except 5mg  TuSa (last dose 7/2)   Goal of Therapy:  Vancomycin trough= 15-20 INR= 2-3   Plan:  -Vancomycin 2000mg  x1 followed by 1500mg  IV q24hr -Cefepime 2gm IV q24h and levaquin 750mg  IV q24hr -Will await INR prior to coumadin dosing  Harland German, Pharm D 04/21/2013 8:55 PM

## 2013-04-22 DIAGNOSIS — E119 Type 2 diabetes mellitus without complications: Secondary | ICD-10-CM

## 2013-04-22 LAB — CBC
Platelets: 365 10*3/uL (ref 150–400)
RBC: 4.27 MIL/uL (ref 3.87–5.11)
RDW: 15.5 % (ref 11.5–15.5)
WBC: 13.1 10*3/uL — ABNORMAL HIGH (ref 4.0–10.5)

## 2013-04-22 LAB — BASIC METABOLIC PANEL
CO2: 27 mEq/L (ref 19–32)
Calcium: 8.5 mg/dL (ref 8.4–10.5)
Creatinine, Ser: 0.99 mg/dL (ref 0.50–1.10)
GFR calc Af Amer: 66 mL/min — ABNORMAL LOW (ref >90)
GFR calc non Af Amer: 57 mL/min — ABNORMAL LOW (ref >90)
Sodium: 136 mEq/L (ref 135–145)

## 2013-04-22 LAB — TSH: TSH: 3.359 u[IU]/mL (ref 0.350–4.500)

## 2013-04-22 LAB — URINALYSIS, ROUTINE W REFLEX MICROSCOPIC
Bilirubin Urine: NEGATIVE
Glucose, UA: NEGATIVE mg/dL
Hgb urine dipstick: NEGATIVE
Ketones, ur: NEGATIVE mg/dL
Nitrite: NEGATIVE
Specific Gravity, Urine: 1.01 (ref 1.005–1.030)
pH: 5.5 (ref 5.0–8.0)

## 2013-04-22 LAB — GLUCOSE, CAPILLARY
Glucose-Capillary: 143 mg/dL — ABNORMAL HIGH (ref 70–99)
Glucose-Capillary: 177 mg/dL — ABNORMAL HIGH (ref 70–99)

## 2013-04-22 LAB — STREP PNEUMONIAE URINARY ANTIGEN: Strep Pneumo Urinary Antigen: NEGATIVE

## 2013-04-22 LAB — PROTIME-INR: INR: 1.79 — ABNORMAL HIGH (ref 0.00–1.49)

## 2013-04-22 MED ORDER — WARFARIN SODIUM 5 MG PO TABS
5.0000 mg | ORAL_TABLET | Freq: Once | ORAL | Status: AC
  Administered 2013-04-22: 5 mg via ORAL
  Filled 2013-04-22: qty 1

## 2013-04-22 MED ORDER — WARFARIN - PHARMACIST DOSING INPATIENT: Freq: Every day | Status: DC

## 2013-04-22 MED ORDER — LEVALBUTEROL HCL 0.63 MG/3ML IN NEBU: 0.6300 mg | INHALATION_SOLUTION | RESPIRATORY_TRACT | Status: DC | PRN

## 2013-04-22 MED ORDER — DEXTROSE 5 % IV SOLN
2.0000 g | Freq: Two times a day (BID) | INTRAVENOUS | Status: DC
  Administered 2013-04-22 – 2013-04-23 (×2): 2 g via INTRAVENOUS
  Filled 2013-04-22 (×3): qty 2

## 2013-04-22 MED ORDER — WARFARIN SODIUM 5 MG PO TABS
5.0000 mg | ORAL_TABLET | ORAL | Status: AC
  Administered 2013-04-22: 5 mg via ORAL
  Filled 2013-04-22: qty 1

## 2013-04-22 MED ORDER — GUAIFENESIN ER 600 MG PO TB12
1200.0000 mg | ORAL_TABLET | Freq: Two times a day (BID) | ORAL | Status: DC
  Administered 2013-04-22 – 2013-04-24 (×5): 1200 mg via ORAL
  Filled 2013-04-22 (×6): qty 2

## 2013-04-22 MED ORDER — LEVALBUTEROL HCL 0.63 MG/3ML IN NEBU
0.6300 mg | INHALATION_SOLUTION | Freq: Four times a day (QID) | RESPIRATORY_TRACT | Status: DC
  Administered 2013-04-23 (×2): 0.63 mg via RESPIRATORY_TRACT
  Filled 2013-04-22 (×6): qty 3

## 2013-04-22 MED ORDER — GUAIFENESIN-DM 100-10 MG/5ML PO SYRP
5.0000 mL | ORAL_SOLUTION | ORAL | Status: DC | PRN
Start: 2013-04-22 — End: 2013-04-24
  Administered 2013-04-22 – 2013-04-23 (×3): 5 mL via ORAL
  Filled 2013-04-22 (×4): qty 5

## 2013-04-22 MED ORDER — VANCOMYCIN HCL 10 G IV SOLR
1250.0000 mg | Freq: Two times a day (BID) | INTRAVENOUS | Status: DC
  Administered 2013-04-22 – 2013-04-23 (×2): 1250 mg via INTRAVENOUS
  Filled 2013-04-22 (×3): qty 1250

## 2013-04-22 MED ORDER — HYDROCOD POLST-CHLORPHEN POLST 10-8 MG/5ML PO LQCR
5.0000 mL | Freq: Two times a day (BID) | ORAL | Status: DC | PRN
  Administered 2013-04-22 – 2013-04-23 (×2): 5 mL via ORAL
  Filled 2013-04-22 (×2): qty 5

## 2013-04-22 NOTE — Progress Notes (Signed)
Pt. Has home cpap. RT inspected the machine. No frayed cords or wires. Machine appears to be intact. Pt. States she can place herself on when she is ready. RT informed pt. To notify if she needs any assistance.

## 2013-04-22 NOTE — Plan of Care (Signed)
Problem: Phase I Progression Outcomes Goal: OOB as tolerated unless otherwise ordered Outcome: Completed/Met Date Met:  04/22/13 Pt OOB to [facility] with minimal to no assist.

## 2013-04-22 NOTE — Progress Notes (Signed)
INR resulted subtherapeutic at 1.77; will give boosted Coumadin dose of 5mg  x1 now and monitor INR for dose adjustments.  Vernard Gambles, PharmD, BCPS 04/22/2013 12:10 AM

## 2013-04-22 NOTE — Progress Notes (Addendum)
Pt arrived to floor in NAD around 21:33. VSS pt in SB. Pt has multiple BP medicines due this PM. MD notified and changed orders for BP meds. Pt educated on blood pressure medicine, oriented to room and floor. Pt verbalized understanding. Baron Hamper, RN 04/22/2013

## 2013-04-22 NOTE — Progress Notes (Signed)
Pt states she cannot sleep with CPAP on. Pt put on 2L of oxygen for the night per pt request. Will continue to monitor pt. Baron Hamper, RN 04/22/2013

## 2013-04-22 NOTE — Progress Notes (Signed)
[facility] PROGRESS NOTE  [name] ZOX:[id number] DOB: [birth year] DOA: 04/21/2013 PCP: Mia Creek, MD  Assessment/Plan: #1 bilateral pneumonia Patient coughing. Patient noted to have a leukocytosis on admission. Chest x-ray consistent with bilateral pneumonia.will check a urine Legionella antigen, check a urine strep pneumococcus antigen, check a sputum Gram stain and culture. Continue IV vancomycin, IV cefepime, IV Levaquin. Will add Mucinex. Continue oxygen. Nebs. Supportive care.  #2 atrial fibrillation Continue amiodaronefor rate control. Coumadin for anticoagulations.  #3 hypertension Continue Norvasc, lisinopril.  #4 obstructive sleep apnea CPAP each bedtime  #5 type 2 diabetes CBG ranges from 138-179. Sliding scale insulin.  #6 chronic diastolic CHF Stable. Continue Lasix.  #7 prophylaxis On Coumadin for DVT prophylaxis.  Code Status: full Family Communication: updated patient no family at bedside. Disposition Plan: home when medically stable.   Consultants:  none  Procedures:  Chest x-ray and 04/21/2013  Antibiotics:  IV Levaquin 04/21/2013  IV cefepime 04/21/2013  IV vancomycin 04/21/2013    HPI/Subjective: Patient coughing in the room. Patient states feeling somewhat better than on admission.  Objective: Filed Vitals:   04/21/13 2313 04/22/13 0223 04/22/13 0559 04/22/13 0900  BP:  108/51 152/68 110/42  Pulse: 59 62 62 66  Temp:  97.9 F (36.6 C) 97.9 F (36.6 C)   TempSrc:  Oral Oral   Resp: 14 19 18    Height:      Weight:   120.9 kg (266 lb 8.6 oz)   SpO2: 95% 94% 92% 96%    Intake/Output Summary (Last 24 hours) at 04/22/13 1030 Last data filed at 04/22/13 0900  Gross per 24 hour  Intake   1370 ml  Output    550 ml  Net    820 ml   Filed Weights   04/21/13 2133 04/22/13 0559  Weight: 120.9 kg (266 lb 8.6 oz) 120.9 kg (266 lb 8.6 oz)    Exam:   General:  NAD  Cardiovascular: RRR  Respiratory: scattered  coarse breath sounds DIFFUSELY  Abdomen: soft, nontender, nondistended, positive bowel sounds.  Extremities: No clubbing cyanosis or edema  Data Reviewed: Basic Metabolic Panel:  Recent Labs Lab 04/21/13 1518 04/22/13 0535  NA 138 136  K 3.6 3.6  CL 97 99  CO2 28 27  GLUCOSE 186* 143*  BUN 25* 21  CREATININE 1.18* 0.99  CALCIUM 8.9 8.5   Liver Function Tests: No results found for this basename: AST, ALT, ALKPHOS, BILITOT, PROT, ALBUMIN,  in the last 168 hours No results found for this basename: LIPASE, AMYLASE,  in the last 168 hours No results found for this basename: AMMONIA,  in the last 168 hours CBC:  Recent Labs Lab 04/21/13 1518 04/22/13 0535  WBC 13.5* 13.1*  HGB 12.2 11.3*  HCT 37.2 35.4*  MCV 83.4 82.9  PLT 385 365   Cardiac Enzymes: No results found for this basename: CKTOTAL, CKMB, CKMBINDEX, TROPONINI,  in the last 168 hours BNP (last 3 results)  Recent Labs  04/05/13 1355 04/21/13 1518  PROBNP 188.6* 308.3*   CBG:  Recent Labs Lab 04/21/13 2236 04/22/13 0553  GLUCAP 185* 143*    No results found for this or any previous visit (from the past 240 hour(s)).   Studies: Dg Chest 2 View  04/21/2013   *RADIOLOGY REPORT*  Clinical Data: Cough and shortness of breath.  Left-sided rib pain.  CHEST - 2 VIEW  Comparison: Chest x-ray dated 04/05/2013  Findings: The patient has patchy bilateral pulmonary infiltrates in the perihilar  regions in the right upper lobe as well as posteriorly on the lateral view.  Chronic mild cardiomegaly. Pulmonary vascularity is normal.  No acute osseous abnormality.  IMPRESSION: Bilateral pulmonary infiltrates.   Original Report Authenticated By: Francene Boyers, M.D.    Scheduled Meds: . amiodarone  300 mg Oral BID  . amLODipine  10 mg Oral Daily  . ceFEPime (MAXIPIME) IV  2 g Intravenous Q24H  . cloNIDine  0.2 mg Oral TID  . furosemide  40 mg Oral BID  . guaiFENesin  1,200 mg Oral BID  . insulin aspart  0-9 Units  Subcutaneous TID WC  . levofloxacin (LEVAQUIN) IV  750 mg Intravenous Q24H  . levothyroxine  100 mcg Oral QAC breakfast  . lisinopril  10 mg Oral Daily  . sodium chloride  3 mL Intravenous Q12H  . sodium chloride  3 mL Intravenous Q12H  . vancomycin  1,500 mg Intravenous Q24H  . Warfarin - Pharmacist Dosing Inpatient   Does not apply q1800   Continuous Infusions:   Principal Problem:   Pneumonia Active Problems:   OBSTRUCTIVE SLEEP APNEA   HYPERTENSION   ATRIAL FIBRILLATION, PAROXYSMAL   CHF (congestive heart failure)    Time spent: > 35 mins    [facility]  [facility] Pager [phone]. If 7PM-7AM, please contact night-coverage at [email or web address], password [email or web address] 04/22/2013, 10:30 AM  LOS: 1 day

## 2013-04-22 NOTE — Progress Notes (Signed)
Pt has 5 antibiotics ordered daily IVPB. Admitting MD Toniann Fail paged and ordered to discontinue Rocephin and Zithromax, but to continue other antibiotics. Baron Hamper, RN 04/22/2013

## 2013-04-22 NOTE — Progress Notes (Signed)
ANTICOAGULATION CONSULT NOTE - Follow Up Consult  Pharmacy Consult for coumadin, vancomycin, levaquin Indication: atrial fibrillation  No Known Allergies  Patient Measurements: Height: 5\' 1"  (154.9 cm) Weight: 266 lb 8.6 oz (120.9 kg) (scale c) IBW/kg (Calculated) : 47.8   Vital Signs: Temp: 97.9 F (36.6 C) (07/04 0559) Temp src: Oral (07/04 0559) BP: 110/42 mmHg (07/04 0900) Pulse Rate: 66 (07/04 0900)  Labs:  Recent Labs  04/21/13 1518 04/21/13 2245 04/22/13 0535  HGB 12.2  --  11.3*  HCT 37.2  --  35.4*  PLT 385  --  365  LABPROT  --  20.1* 20.3*  INR  --  1.77* 1.79*  CREATININE 1.18*  --  0.99    Estimated Creatinine Clearance: 65.2 ml/min (by C-G formula based on Cr of 0.99).  Assessment: Patient is a 72 y.o F on coumadin for hx of Afib.  INR is subtherapeuitc at 1.79.  No bleeding documented.  Levaquin, cefepime, and vancomycin started yesterday for empiric coverage for suspected PNA.  Scr 0.99 (crcl~60), Afebrile, WBC 13.1, no cultures  Goal of Therapy:  INR 2-3; vancomycin trough level 15-20    Plan:  1) change cefepime to 2gm IV q12h 2) change vancomycin to 1250mg  IV q12h 3) no change for levaquin 4) coumadin 5mg  PO x1 today  Finley Dinkel P 04/22/2013,11:41 AM

## 2013-04-23 DIAGNOSIS — I10 Essential (primary) hypertension: Secondary | ICD-10-CM

## 2013-04-23 LAB — GLUCOSE, CAPILLARY
Glucose-Capillary: 142 mg/dL — ABNORMAL HIGH (ref 70–99)
Glucose-Capillary: 177 mg/dL — ABNORMAL HIGH (ref 70–99)

## 2013-04-23 LAB — PROTIME-INR: INR: 1.97 — ABNORMAL HIGH (ref 0.00–1.49)

## 2013-04-23 MED ORDER — WARFARIN SODIUM 5 MG PO TABS
5.0000 mg | ORAL_TABLET | Freq: Once | ORAL | Status: AC
  Administered 2013-04-23: 5 mg via ORAL
  Filled 2013-04-23: qty 1

## 2013-04-23 NOTE — Progress Notes (Signed)
ANTICOAGULATION CONSULT NOTE - Follow Up Consult  Pharmacy Consult for coumadin Indication: atrial fibrillation  No Known Allergies  Patient Measurements: Height: 5\' 1"  (154.9 cm) Weight: 265 lb 3.2 oz (120.294 kg) (scale c) IBW/kg (Calculated) : 47.8   Vital Signs: Temp: 97.8 F (36.6 C) (07/05 0446) Temp src: Oral (07/05 0446) BP: 118/71 mmHg (07/05 0446) Pulse Rate: 61 (07/05 0446)  Labs:  Recent Labs  04/21/13 1518 04/21/13 2245 04/22/13 0535 04/23/13 0540  HGB 12.2  --  11.3*  --   HCT 37.2  --  35.4*  --   PLT 385  --  365  --   LABPROT  --  20.1* 20.3* 21.8*  INR  --  1.77* 1.79* 1.97*  CREATININE 1.18*  --  0.99  --     Estimated Creatinine Clearance: 65 ml/min (by C-G formula based on Cr of 0.99).  Assessment: Patient is a 72 y.o F on coumadin for hx Afib.  INR is slightly below goal at 1.97 today. No bleeding documented.  Will watch INR closely for potential drug-drug intxn with Levaquin.  Goal of Therapy:  INR 2-3    Plan:  1) coumadin 5mg  PO x1 today  Arn Mcomber P 04/23/2013,1:40 PM

## 2013-04-23 NOTE — Progress Notes (Signed)
[facility] PROGRESS NOTE  Lujean Ebright ZOX:[id number] DOB: 07-13-1941 DOA: 04/21/2013 PCP: Mia Creek, MD  Assessment/Plan: #1 bilateral pneumonia Patient coughing. Patient noted to have a leukocytosis on admission. Chest x-ray consistent with bilateral pneumonia. Urine Legionella antigen pending, urine strep pneumococcus antigen pending, sputum Gram stain and culture pending. Continue IV Levaquin, mucinex, tussionex, oxygen,Nebs.  D/C IV Vancomycin and cefepime. Supportive care.  #2 atrial fibrillation Continue amiodaronefor rate control. Coumadin for anticoagulation.  #3 hypertension Continue Norvasc, lisinopril.  #4 obstructive sleep apnea CPAP each bedtime  #5 type 2 diabetes CBG ranges from 142-182. Sliding scale insulin.  #6 chronic diastolic CHF Stable. Continue Lasix.  #7 prophylaxis On Coumadin for DVT prophylaxis.  Code Status: full Family Communication: updated patient no family at bedside. Disposition Plan: home when medically stable.   Consultants:  none  Procedures:  Chest x-ray and 04/21/2013  Antibiotics:  IV Levaquin 04/21/2013   IV cefepime 04/21/2013----> 04/23/13  IV vancomycin 04/21/2013----> 04/23/13    HPI/Subjective: Patient states feeling better. Cough improving with tussionex.  Objective: Filed Vitals:   04/22/13 2220 04/22/13 2339 04/23/13 0119 04/23/13 0446  BP: 112/35 115/44  118/71  Pulse: 65 65  61  Temp: 98.1 F (36.7 C)   97.8 F (36.6 C)  TempSrc: Oral   Oral  Resp: 20   18  Height:      Weight:    120.294 kg (265 lb 3.2 oz)  SpO2: 94%  94% 93%    Intake/Output Summary (Last 24 hours) at 04/23/13 1149 Last data filed at 04/23/13 0900  Gross per 24 hour  Intake   2150 ml  Output   2525 ml  Net   -375 ml   Filed Weights   04/21/13 2133 04/22/13 0559 04/23/13 0446  Weight: 120.9 kg (266 lb 8.6 oz) 120.9 kg (266 lb 8.6 oz) 120.294 kg (265 lb 3.2 oz)    Exam:   General:  NAD  Cardiovascular:  RRR  Respiratory: scattered coarse breath sounds DIFFUSELY  Abdomen: soft, nontender, nondistended, positive bowel sounds.  Extremities: No clubbing cyanosis or edema  Data Reviewed: Basic Metabolic Panel:  Recent Labs Lab 04/21/13 1518 04/22/13 0535  NA 138 136  K 3.6 3.6  CL 97 99  CO2 28 27  GLUCOSE 186* 143*  BUN 25* 21  CREATININE 1.18* 0.99  CALCIUM 8.9 8.5   Liver Function Tests: No results found for this basename: AST, ALT, ALKPHOS, BILITOT, PROT, ALBUMIN,  in the last 168 hours No results found for this basename: LIPASE, AMYLASE,  in the last 168 hours No results found for this basename: AMMONIA,  in the last 168 hours CBC:  Recent Labs Lab 04/21/13 1518 04/22/13 0535  WBC 13.5* 13.1*  HGB 12.2 11.3*  HCT 37.2 35.4*  MCV 83.4 82.9  PLT 385 365   Cardiac Enzymes: No results found for this basename: CKTOTAL, CKMB, CKMBINDEX, TROPONINI,  in the last 168 hours BNP (last 3 results)  Recent Labs  04/05/13 1355 04/21/13 1518  PROBNP 188.6* 308.3*   CBG:  Recent Labs Lab 04/22/13 1149 04/22/13 1614 04/22/13 2103 04/23/13 0609 04/23/13 1055  GLUCAP 179* 138* 177* 142* 182*    No results found for this or any previous visit (from the past 240 hour(s)).   Studies: Dg Chest 2 View  04/21/2013   *RADIOLOGY REPORT*  Clinical Data: Cough and shortness of breath.  Left-sided rib pain.  CHEST - 2 VIEW  Comparison: Chest x-ray dated 04/05/2013  Findings: The  patient has patchy bilateral pulmonary infiltrates in the perihilar regions in the right upper lobe as well as posteriorly on the lateral view.  Chronic mild cardiomegaly. Pulmonary vascularity is normal.  No acute osseous abnormality.  IMPRESSION: Bilateral pulmonary infiltrates.   Original Report Authenticated By: Francene Boyers, M.D.    Scheduled Meds: . amiodarone  300 mg Oral BID  . amLODipine  10 mg Oral Daily  . ceFEPime (MAXIPIME) IV  2 g Intravenous Q12H  . cloNIDine  0.2 mg Oral TID  .  furosemide  40 mg Oral BID  . guaiFENesin  1,200 mg Oral BID  . insulin aspart  0-9 Units Subcutaneous TID WC  . levofloxacin (LEVAQUIN) IV  750 mg Intravenous Q24H  . levothyroxine  100 mcg Oral QAC breakfast  . lisinopril  10 mg Oral Daily  . sodium chloride  3 mL Intravenous Q12H  . sodium chloride  3 mL Intravenous Q12H  . vancomycin  1,250 mg Intravenous Q12H  . Warfarin - Pharmacist Dosing Inpatient   Does not apply q1800   Continuous Infusions:   Principal Problem:   Pneumonia Active Problems:   OBSTRUCTIVE SLEEP APNEA   HYPERTENSION   ATRIAL FIBRILLATION, PAROXYSMAL   CHF (congestive heart failure)    Time spent: > 35 mins    [facility]  [facility] Pager [phone]. If 7PM-7AM, please contact night-coverage at [email or web address], password [facility] 04/23/2013, 11:49 AM  LOS: 2 days

## 2013-04-24 DIAGNOSIS — Z7901 Long term (current) use of anticoagulants: Secondary | ICD-10-CM

## 2013-04-24 LAB — URINE CULTURE: Colony Count: NO GROWTH

## 2013-04-24 LAB — CBC
HCT: 36.7 % (ref 36.0–46.0)
RDW: 15.4 % (ref 11.5–15.5)
WBC: 10.3 10*3/uL (ref 4.0–10.5)

## 2013-04-24 LAB — BASIC METABOLIC PANEL
Chloride: 97 mEq/L (ref 96–112)
Creatinine, Ser: 1.1 mg/dL (ref 0.50–1.10)
GFR calc Af Amer: 58 mL/min — ABNORMAL LOW (ref >90)
GFR calc non Af Amer: 50 mL/min — ABNORMAL LOW (ref >90)
Potassium: 4 mEq/L (ref 3.5–5.1)

## 2013-04-24 LAB — GLUCOSE, CAPILLARY: Glucose-Capillary: 161 mg/dL — ABNORMAL HIGH (ref 70–99)

## 2013-04-24 LAB — PROTIME-INR
INR: 2.23 — ABNORMAL HIGH (ref 0.00–1.49)
Prothrombin Time: 24 seconds — ABNORMAL HIGH (ref 11.6–15.2)

## 2013-04-24 LAB — LEGIONELLA ANTIGEN, URINE

## 2013-04-24 MED ORDER — GUAIFENESIN ER 600 MG PO TB12: 1200.0000 mg | ORAL_TABLET | Freq: Two times a day (BID) | ORAL | Status: DC

## 2013-04-24 MED ORDER — FUROSEMIDE 40 MG PO TABS: 40.0000 mg | ORAL_TABLET | Freq: Every day | ORAL | Status: DC

## 2013-04-24 MED ORDER — LEVOFLOXACIN 750 MG PO TABS: 750.0000 mg | ORAL_TABLET | Freq: Every day | ORAL | Status: AC

## 2013-04-24 MED ORDER — LEVOFLOXACIN 750 MG PO TABS
750.0000 mg | ORAL_TABLET | Freq: Every day | ORAL | Status: DC
  Administered 2013-04-24: 750 mg via ORAL
  Filled 2013-04-24: qty 1

## 2013-04-24 MED ORDER — LEVALBUTEROL TARTRATE 45 MCG/ACT IN AERO: 1.0000 | INHALATION_SPRAY | RESPIRATORY_TRACT | Status: DC | PRN

## 2013-04-24 MED ORDER — GUAIFENESIN-DM 100-10 MG/5ML PO SYRP: 5.0000 mL | ORAL_SOLUTION | ORAL | Status: DC | PRN

## 2013-04-24 MED ORDER — WARFARIN SODIUM 2.5 MG PO TABS
2.5000 mg | ORAL_TABLET | Freq: Once | ORAL | Status: DC
  Filled 2013-04-24: qty 1

## 2013-04-24 MED ORDER — FUROSEMIDE 40 MG PO TABS: 40.0000 mg | ORAL_TABLET | Freq: Two times a day (BID) | ORAL | Status: DC

## 2013-04-24 NOTE — Discharge Summary (Signed)
Physician Discharge Summary  Natale Barba WGN:[id number] DOB: 1941/04/22 DOA: 04/21/2013  PCP: Mia Creek, MD  Admit date: 04/21/2013 Discharge date: 04/24/2013  Time spent: 65 minutes  Recommendations for Outpatient Follow-up:  1. Patient is to followup with TALBOT, DAVID C, MD in 1 week. Patient's pneumonia need to be reassessed at that time. Patient will need a basic metabolic profile and a CBC done on followup to followup on electrolytes and renal function. 2. Patient is to followup in the Coumadin clinic on Wednesday, 04/27/2013 for PT/INR check.  Discharge Diagnoses:  Principal Problem:   CAP (community acquired pneumonia) Active Problems:   DM   HYPERLIPIDEMIA   OBSTRUCTIVE SLEEP APNEA   HYPERTENSION   ATRIAL FIBRILLATION, PAROXYSMAL   Chronic diastolic heart failure   CHF (congestive heart failure)   Discharge Condition: Stable and improved.  Diet recommendation: Heart healthy diet  Filed Weights   04/22/13 0559 04/23/13 0446 04/24/13 0600  Weight: 120.9 kg (266 lb 8.6 oz) 120.294 kg (265 lb 3.2 oz) 119.7 kg (263 lb 14.3 oz)    History of present illness:  [name] is a 72 y.o. female presented the ER because of persistent cough patient has been having for last 3 days. Patient was recently admitted for acute failure probably from dehydration and at that time patient's Lasix was held. Patient's Lasix was restarted recently and due to the cough she had called her cardiologist who had increased her Lasix to 80 twice a day 2 days ago. Despite taking which patient was having persistent cough which was nonproductive. In the ER chest x-ray shows bilateral infiltrates concerning for pneumonia and given her leukocytosis at this time patient has been admitted for possible pneumonia. Patient denies any chest pain or shortness of breath. Patient states that her last few days her weight had increased by 4 pounds. BNP level is around 300   Hospital Course:  #1 bilateral  community-acquired pneumonia  Patient had presented with persistent cough 3 days prior to admission and patient's race Lasix had recently been resumed. Chest x-ray obtained was consistent with bilateral pneumonia. Patient was also noted to have a leukocytosis on admission. Urine Legionella antigen was negative, urine strep pneumococcus antigen was also negative. Sputum Gram stain and culture were pending  At time of discharge. Patient was initially started on IV vancomycin and IV cefepime and IV Levaquin. Mucinex oxygen nebulizers and Tussionex was added to her regimen. Patient improved clinically her leukocytosis trending down and antibiotic coverage was narrowed. IV vancomycin IV cefepime was subsequently discontinued. Patient was maintained on IV Levaquin. Patient was subsequently transitioned to oral Levaquin and will be discharged home on 4 more days of oral Levaquin to complete a one-week course of antibiotic therapy. Patient will followup with PCP as outpatient. #2 atrial fibrillation  Remained stable throughout the hospitalization. Patient was continued on  Amiodarone for rate control. Coumadin for anticoagulation.  #3 hypertension  Remained stable. Continued on Norvasc, lisinopril, and Lasix #4 obstructive sleep apnea  CPAP each bedtime  #5 type 2 diabetes  Remained stable throughout the hospitalization. Patient was maintained on a sliding scale insulin. Patient will followup with PCP as outpatient. #6 chronic diastolic CHF  On admission patient was noted to have a pro BNP of 308.3. One set of cardiac enzymes were drawn which were negative. Patient remained asymptomatic. Patient was initially placed on IV Lasix which was subsequently transitioned to oral Lasix. Patient improved clinically and patient were discharged home on Lasix 40 mg twice daily.  Patient will followup with her cardiologist as previously scheduled and PCP as outpatient.       Procedures: Chest x-ray and  04/21/2013   Consultations:  None  Discharge Exam: Filed Vitals:   04/23/13 1355 04/23/13 2058 04/24/13 0600 04/24/13 1347  BP: 124/59 136/53 143/57 138/55  Pulse: 62 66 63 67  Temp: 98.2 F (36.8 C) 97.9 F (36.6 C) 98.4 F (36.9 C) 98.1 F (36.7 C)  TempSrc: Oral Oral Oral Oral  Resp: 18 18 20 18   Height:      Weight:   119.7 kg (263 lb 14.3 oz)   SpO2: 95% 95% 93% 94%    General: NAD Cardiovascular: Irregularly irregular Respiratory: CTAB  Discharge Instructions      Discharge Orders   Future Appointments Provider Department Dept Phone   04/25/2013 9:00 AM [facility] [place] [facility] [phone]   Patient should wear two piece clothing and wear no powder or deodorant. Patient should arrive 15 minutes early.   04/27/2013 9:15 AM [facility] [phone]   07/13/2013 10:30 AM Barbaraann Share, MD [facility] [phone]   Future Orders Complete By Expires     Diet - low sodium heart healthy  As directed     Discharge instructions  As directed     Comments:      Follow up with TALBOT, DAVID C, MD in 1 week. Follow up at coumadin clinic on Wednesday  04/27/13 for PT/INR    Increase activity slowly  As directed         Medication List         amiodarone 200 MG tablet  Commonly known as:  PACERONE  Take 1.5 tablets (300 mg total) by mouth 2 (two) times daily.     amLODipine 5 MG tablet  Commonly known as:  NORVASC  Take 2 tablets (10 mg total) by mouth daily.     cloNIDine 0.2 MG tablet  Commonly known as:  CATAPRES  Take 1 tablet (0.2 mg total) by mouth 3 (three) times daily.     furosemide 40 MG tablet  Commonly known as:  LASIX  Take 1 tablet (40 mg total) by mouth 2 (two) times daily.     guaiFENesin 600 MG 12 hr tablet  Commonly known as:  MUCINEX  Take 2 tablets (1,200 mg total) by mouth 2 (two) times daily. Take for 5 days then use as needed.      guaiFENesin-dextromethorphan 100-10 MG/5ML syrup  Commonly known as:  ROBITUSSIN DM  Take 5 mLs by mouth every 4 (four) hours as needed for cough.     levalbuterol 45 MCG/ACT inhaler  Commonly known as:  XOPENEX HFA  Inhale 1-2 puffs into the lungs every 4 (four) hours as needed for wheezing or shortness of breath. Use 2 puffs 3 times daily x 4 days then use as needed.     levofloxacin 750 MG tablet  Commonly known as:  LEVAQUIN  Take 1 tablet (750 mg total) by mouth daily. Take for 4 days then stop.  Start taking on:  04/25/2013     levothyroxine 100 MCG tablet  Commonly known as:  SYNTHROID, LEVOTHROID  Take 100 mcg by mouth daily before breakfast.     lisinopril 10 MG tablet  Commonly known as:  PRINIVIL,ZESTRIL  Take 1 tablet (10 mg total) by mouth daily.     warfarin 5 MG tablet  Commonly known as:  COUMADIN  Take  2.5-5 mg by mouth every evening. Take 5mg  on Tues and Sat, take 2.5mg  all other days       No Known Allergies Follow-up Information   Follow up with TALBOT, DAVID C, MD. Schedule an appointment as soon as possible for a visit in 1 week.   Contact information:   [address] [place] [address] [phone]       Follow up with [facility]. Schedule an appointment as soon as possible for a visit on 04/27/2013. (f/u for PT/INR check for coumadin)    Contact information:   [address], [address] [place] [address] [phone]       The results of significant diagnostics from this hospitalization (including imaging, microbiology, ancillary and laboratory) are listed below for reference.    Significant Diagnostic Studies: Dg Chest 2 View  04/21/2013   *RADIOLOGY REPORT*  Clinical Data: Cough and shortness of breath.  Left-sided rib pain.  CHEST - 2 VIEW  Comparison: Chest x-ray dated 04/05/2013  Findings: The patient has patchy bilateral pulmonary infiltrates in the perihilar regions in the right upper lobe as well  as posteriorly on the lateral view.  Chronic mild cardiomegaly. Pulmonary vascularity is normal.  No acute osseous abnormality.  IMPRESSION: Bilateral pulmonary infiltrates.   Original Report Authenticated By: Francene Boyers, M.D.   Dg Chest 2 View  04/05/2013   *RADIOLOGY REPORT*  Clinical Data: Cough.  Dizziness.  Congestive heart failure.  CHEST - 2 VIEW  Comparison: 05/13/2012  Findings: Mild cardiomegaly stable.  Pulmonary interstitial prominence is unchanged.  There is mild scarring seen in the left lung base.  No evidence of acute infiltrate or edema.  No evidence of pleural effusion.  No mass or lymphadenopathy identified.  IMPRESSION: Stable cardiomegaly and mild left basilar scarring.  No active disease.   Original Report Authenticated By: Myles Rosenthal, M.D.   Ct Head Wo Contrast  04/05/2013   *RADIOLOGY REPORT*  Clinical Data: Dizziness and headache.  CT HEAD WITHOUT CONTRAST  Technique:  Contiguous axial images were obtained from the base of the skull through the vertex without contrast.  Comparison: None.  Findings: The ventricles are normal.  No extra-axial fluid collections are seen.  The brainstem and cerebellum are unremarkable.  No acute intracranial findings such as infarction or hemorrhage.  No mass lesions.  The bony calvarium is intact.  The visualized paranasal sinuses and mastoid air cells are clear.  Small basal ganglia calcifications are noted.  Hyperostosis frontalis interna is present.  IMPRESSION: No acute intracranial findings or mass lesion.   Original Report Authenticated By: Rudie Meyer, M.D.   Mri Brain Without Contrast  04/06/2013   *RADIOLOGY REPORT*  Clinical Data:  Dizziness.  Diabetic hypertensive patient with hyperlipidemia and history of atrial fibrillation.  MRI BRAIN WITHOUT CONTRAST MRA HEAD WITHOUT CONTRAST  Technique: Multiplanar, multiecho pulse sequences of the brain and surrounding structures were obtained according to standard protocol without intravenous  contrast.  Angiographic images of the head were obtained using MRA technique without contrast.  Comparison: 04/05/2013 CT.  No comparison MR.  MRI HEAD  Findings:  No acute infarct.  No intracranial hemorrhage.  Moderate small vessel disease type changes.  Mild global atrophy most notable parietal lobes without evidence of hydrocephalus.  Major intracranial vascular structures are patent.  No intracranial mass lesion detected on this unenhanced exam.  Cerebellar tonsils minimally low-lying but within range normal limits.  Pituitary region, pineal region and orbital structures  unremarkable.  IMPRESSION: No acute infarct.  Moderate small vessel disease type changes.  Mild global atrophy most notable parietal lobes without hydrocephalus.  MRA HEAD  Findings: Mild narrowing supraclinoid/cavernous junction of the internal carotid artery bilaterally.  High-grade focal narrowing proximal A1 segment of the left anterior cerebral artery.  Mild narrowing proximal A1 segments right anterior cerebral artery.  No significant stenosis of the basilar artery or distal vertebral arteries.  Nonvisualization PICAs.   Well visualized AICAs.  Mild narrowing proximal left superior cerebellar artery.  Mild narrowing distal right posterior cerebral artery branches.  No aneurysm or vascular malformation noted.  IMPRESSION: Mild intracranial atherosclerotic type changes as detailed above.   Original Report Authenticated By: Lacy Duverney, M.D.   US Renal  04/06/2013   *RADIOLOGY REPORT*  Clinical Data: Acute renal failure.  RENAL/URINARY TRACT ULTRASOUND COMPLETE  Comparison:  CT abdomen and pelvis 04/10/2012.  Findings:  Right Kidney:  Measures 10.2 cm and appears normal without stone, mass or hydronephrosis.  Right adrenal lesion consistent with an adenoma as seen on prior CT scan is noted.  Left Kidney:  Measures 10.2 cm and appears normal without stone, mass or hydronephrosis.  Bladder:  Unremarkable.  IMPRESSION:  1.  Normal-appearing  kidneys.  Negative for hydronephrosis. 2.  Unchanged right adrenal adenoma.   Original Report Authenticated By: Holley Dexter, M.D.   Mr Mra Head/brain Wo Cm  04/06/2013   *RADIOLOGY REPORT*  Clinical Data:  Dizziness.  Diabetic hypertensive patient with hyperlipidemia and history of atrial fibrillation.  MRI BRAIN WITHOUT CONTRAST MRA HEAD WITHOUT CONTRAST  Technique: Multiplanar, multiecho pulse sequences of the brain and surrounding structures were obtained according to standard protocol without intravenous contrast.  Angiographic images of the head were obtained using MRA technique without contrast.  Comparison: 04/05/2013 CT.  No comparison MR.  MRI HEAD  Findings:  No acute infarct.  No intracranial hemorrhage.  Moderate small vessel disease type changes.  Mild global atrophy most notable parietal lobes without evidence of hydrocephalus.  Major intracranial vascular structures are patent.  No intracranial mass lesion detected on this unenhanced exam.  Cerebellar tonsils minimally low-lying but within range normal limits.  Pituitary region, pineal region and orbital structures unremarkable.  IMPRESSION: No acute infarct.  Moderate small vessel disease type changes.  Mild global atrophy most notable parietal lobes without hydrocephalus.  MRA HEAD  Findings: Mild narrowing supraclinoid/cavernous junction of the internal carotid artery bilaterally.  High-grade focal narrowing proximal A1 segment of the left anterior cerebral artery.  Mild narrowing proximal A1 segments right anterior cerebral artery.  No significant stenosis of the basilar artery or distal vertebral arteries.  Nonvisualization PICAs.   Well visualized AICAs.  Mild narrowing proximal left superior cerebellar artery.  Mild narrowing distal right posterior cerebral artery branches.  No aneurysm or vascular malformation noted.  IMPRESSION: Mild intracranial atherosclerotic type changes as detailed above.   Original Report Authenticated By:  Lacy Duverney, M.D.    Microbiology: Recent Results (from the past 240 hour(s))  URINE CULTURE     Status: None   Collection Time    04/22/13 12:35 PM      Result Value Range Status   Specimen Description URINE, RANDOM   Final   Special Requests NONE   Final   Culture  Setup Time 04/22/2013 19:09   Final   Colony Count NO GROWTH   Final   Culture NO GROWTH   Final   Report Status 04/24/2013 FINAL   Final  Labs: Basic Metabolic Panel:  Recent Labs Lab 04/21/13 1518 04/22/13 0535 04/24/13 0540  NA 138 136 136  K 3.6 3.6 4.0  CL 97 99 97  CO2 28 27 25   GLUCOSE 186* 143* 174*  BUN 25* 21 20  CREATININE 1.18* 0.99 1.10  CALCIUM 8.9 8.5 8.7   Liver Function Tests: No results found for this basename: AST, ALT, ALKPHOS, BILITOT, PROT, ALBUMIN,  in the last 168 hours No results found for this basename: LIPASE, AMYLASE,  in the last 168 hours No results found for this basename: AMMONIA,  in the last 168 hours CBC:  Recent Labs Lab 04/21/13 1518 04/22/13 0535 04/24/13 0540  WBC 13.5* 13.1* 10.3  HGB 12.2 11.3* 11.6*  HCT 37.2 35.4* 36.7  MCV 83.4 82.9 83.4  PLT 385 365 420*   Cardiac Enzymes: No results found for this basename: CKTOTAL, CKMB, CKMBINDEX, TROPONINI,  in the last 168 hours BNP: BNP (last 3 results)  Recent Labs  04/05/13 1355 04/21/13 1518  PROBNP 188.6* 308.3*   CBG:  Recent Labs Lab 04/23/13 1055 04/23/13 1627 04/23/13 2147 04/24/13 0700 04/24/13 1112  GLUCAP 182* 165* 177* 161* 107*       Signed:  Taheera Thomann  [facility] 04/24/2013, 1:58 PM

## 2013-04-24 NOTE — Progress Notes (Signed)
ANTICOAGULATION CONSULT NOTE - Follow Up Consult  Pharmacy Consult for coumadin, levaquin Indication: atrial fibrillation, PNA  No Known Allergies  Patient Measurements: Height: 5\' 1"  (154.9 cm) Weight: 263 lb 14.3 oz (119.7 kg) IBW/kg (Calculated) : 47.8   Vital Signs: Temp: 98.4 F (36.9 C) (07/06 0600) Temp src: Oral (07/06 0600) BP: 143/57 mmHg (07/06 0600) Pulse Rate: 63 (07/06 0600)  Labs:  Recent Labs  04/21/13 1518  04/22/13 0535 04/23/13 0540 04/24/13 0540  HGB 12.2  --  11.3*  --  11.6*  HCT 37.2  --  35.4*  --  36.7  PLT 385  --  365  --  420*  LABPROT  --   < > 20.3* 21.8* 24.0*  INR  --   < > 1.79* 1.97* 2.23*  CREATININE 1.18*  --  0.99  --  1.10  < > = values in this interval not displayed.  Estimated Creatinine Clearance: 58.4 ml/min (by C-G formula based on Cr of 1.1).  Assessment: Patient is a 72 y.o F on coumadin for afib.  INR is therapeutic today at 2.23.  No bleeding documented.  She's also currently on levaquin day #4 for PNA.  All cultures have been negative thus far. She's afebrile, wbc wnl, and renal funct stable with crcl~58.  Goal of Therapy:  INR 2-3    Plan:  1) coumadin 2.5mg  PO x1 today 2) continue current levaquin dose  Sheryll Dymek P 04/24/2013,1:04 PM

## 2013-04-25 ENCOUNTER — Ambulatory Visit (HOSPITAL_COMMUNITY)
Admission: RE | Admit: 2013-04-25 | Discharge: 2013-04-25 | Disposition: A | Discharge status: Home / Self Care | Payer: Medicare Other | Source: Ambulatory Visit | Attending: Internal Medicine | Admitting: Internal Medicine

## 2013-04-25 DIAGNOSIS — Z1231 Encounter for screening mammogram for malignant neoplasm of breast: Secondary | ICD-10-CM | POA: Insufficient documentation

## 2013-04-26 NOTE — Progress Notes (Signed)
Utilization review completed.  

## 2013-04-27 ENCOUNTER — Ambulatory Visit (INDEPENDENT_AMBULATORY_CARE_PROVIDER_SITE_OTHER): Payer: Medicare Other | Admitting: *Deleted

## 2013-04-27 DIAGNOSIS — I4892 Unspecified atrial flutter: Secondary | ICD-10-CM

## 2013-04-27 DIAGNOSIS — I4891 Unspecified atrial fibrillation: Secondary | ICD-10-CM

## 2013-04-27 DIAGNOSIS — Z7901 Long term (current) use of anticoagulants: Secondary | ICD-10-CM

## 2013-05-09 ENCOUNTER — Telehealth: Payer: Self-pay | Admitting: Internal Medicine

## 2013-05-09 NOTE — Telephone Encounter (Signed)
New Prob  Pt states her pulse is 93 and she is very tired.

## 2013-05-09 NOTE — Telephone Encounter (Addendum)
Spoke with pt, she is currently in [place], she is feeling like she can not take a deep breath. She checked her bp 169/81, pulse 61 but her 02 sat is 93%. She wanted to make sure that was not too low. She reports her feet are very swollen but she states she also has gout. She sleeps in a hospital bed and is having to sleep with the head of the bed elevated and reports she is still not comfortable. Today she has an occ nonproductive cough that is new. She has no fever. Will discuss with dr Graciela Husbands.

## 2013-05-09 NOTE — Telephone Encounter (Signed)
Discussed with dr Graciela Husbands, she will cont to monitor. If she does not improve or her symptoms worsen then she will need to go to the urgent care or ED in [place]. Pt voiced understanding.

## 2013-05-20 ENCOUNTER — Telehealth: Payer: Self-pay | Admitting: *Deleted

## 2013-05-20 NOTE — Telephone Encounter (Signed)
Pt calls to tell us that she is in [place] and was admitted to hospital on 05/11/13 there for SOB and possible PNA. She is still currently admitted and will call us when she's discharged to set up f/u INR check.

## 2013-05-24 ENCOUNTER — Ambulatory Visit (INDEPENDENT_AMBULATORY_CARE_PROVIDER_SITE_OTHER): Payer: Medicare Other | Admitting: *Deleted

## 2013-05-24 DIAGNOSIS — Z7901 Long term (current) use of anticoagulants: Secondary | ICD-10-CM

## 2013-05-24 DIAGNOSIS — I4891 Unspecified atrial fibrillation: Secondary | ICD-10-CM

## 2013-05-24 DIAGNOSIS — I4892 Unspecified atrial flutter: Secondary | ICD-10-CM

## 2013-06-08 ENCOUNTER — Ambulatory Visit (INDEPENDENT_AMBULATORY_CARE_PROVIDER_SITE_OTHER): Payer: Medicare Other | Admitting: Physician Assistant

## 2013-06-08 ENCOUNTER — Encounter: Payer: Self-pay | Admitting: Physician Assistant

## 2013-06-08 ENCOUNTER — Ambulatory Visit (INDEPENDENT_AMBULATORY_CARE_PROVIDER_SITE_OTHER): Payer: Medicare Other | Admitting: Pharmacist

## 2013-06-08 VITALS — BP 125/65 | HR 52 | Ht 61.0 in | Wt 259.0 lb

## 2013-06-08 DIAGNOSIS — I5032 Chronic diastolic (congestive) heart failure: Secondary | ICD-10-CM

## 2013-06-08 DIAGNOSIS — I4891 Unspecified atrial fibrillation: Secondary | ICD-10-CM

## 2013-06-08 DIAGNOSIS — I10 Essential (primary) hypertension: Secondary | ICD-10-CM

## 2013-06-08 DIAGNOSIS — Z7901 Long term (current) use of anticoagulants: Secondary | ICD-10-CM

## 2013-06-08 DIAGNOSIS — I4892 Unspecified atrial flutter: Secondary | ICD-10-CM

## 2013-06-08 NOTE — Patient Instructions (Addendum)
YOU HAVE BEEN GIVEN AN RX TO GIVE TO DR. TALBOT TODAY FOR LAB WORK ( BMET, LFT) WITH THE RESULTS TO BE FAXED TO [facility], [facility] [phone]  WEIGH DAILY AND CALL IF YOUR WEIGHT IS UP BY 3 LB's IN 1 DAY OR 5 LB's IN 1 WEEK [phone]  PLEASE FOLLOW UP WITH DR. Graciela Husbands 08/11/13 @ 4:15

## 2013-06-08 NOTE — Progress Notes (Signed)
[address] [place], [place]  [address] Phone: [phone] Fax:  [phone]  Date:  06/08/2013   ID:  [name], DOB 1941-04-28, MRN [id number]  PCP:  Mia Creek, MD  Cardiologist/Electrophysiologist:  Dr. Sherryl Manges    History of Present Illness: [name] is a 72 y.o. female who returns for follow up after several recent admissions to the hospital.    She has a hx of atrial fibrillation/flutter, HTN, DM2, HL, diastolic CHF, sleep apnea. She has undergone DCCV in the past.  She has failed flecainide and dofetilide in the past.  She was placed on amiodarone in 2013.  Myoview 2/14: Normal, EF 75%  She was admitted to [facility] in 03/2013 for acute kidney injury in the setting of dehydration from UTI. Echo 04/06/13: EF 55-60%, mild LAE.  Carotid US 6/14: Bilateral ICA less than 39%. She was readmitted in 04/2013 with community acquired pneumonia. She seemed to be improving from this and went to [place] for a family reunion. While in [place], she was readmitted with hypoxic respiratory failure. I do not have a discharge summary. However, after reviewing several records that we do have, it appears that she was admitted with acute on chronic diastolic CHF. Chest CT was negative for pulmonary embolus but did demonstrate significant edema. She had a venous Doppler that was negative for DVT. It appears that she was diuresed. It appears that she was seen by both cardiology and pulmonology. An echocardiogram performed 06/02/13: EF 65-69%, mild LVH, no evidence of pulmonary hypertension.  Since discharge, she notes improved dyspnea with exertion. She still exhibits NYHA class IIb-3 symptoms. She denies orthopnea. She sleeps with a CPAP. LE edema is much improved. She denies chest pain. She denies syncope. She denies palpitations.  Labs (7/13):  ALT 11, Hgb 13.3, TSH 1.24 Labs (8/13):  K 4.5, creatinine 0.9 Labs (6/14):  K 5.5, creatinine 1.52, ALT 72, LDL  112 Labs (7/14)   K 4, creatinine 1.10, Hgb 11.6, TSH 3.359  Wt Readings from Last 3 Encounters:  06/08/13 259 lb (117.482 kg)  04/24/13 263 lb 14.3 oz (119.7 kg)  04/07/13 264 lb 11.2 oz (120.067 kg)     Past Medical History  Diagnosis Date  . Atrial fibrillation /flutter   . Hypertension     a.  Renal arterial Dopplers 12/2011: 1-59% right renal artery stenosis  . Diabetes mellitus   . Hyperlipidemia   . Gout   . 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 Diastolic CHF, chronic     a.  echo 2006 - ef 55-65%; mild diast dysfxn;    b. Echo 08/2011: Mild LVH, EF 60%.   . Arthritis   . Morbid obesity   . OSA (obstructive sleep apnea)     uses cpap  . CHF (congestive heart failure)   . Back pain   . Chronic kidney disease     Current Outpatient Prescriptions  Medication Sig Dispense Refill  . amiodarone (PACERONE) 200 MG tablet Take 1.5 tablets (300 mg total) by mouth 2 (two) times daily.  90 tablet  3  . amLODipine (NORVASC) 5 MG tablet Take 2 tablets (10 mg total) by mouth daily.  30 tablet  0  . cloNIDine (CATAPRES) 0.2 MG tablet Take 1 tablet (0.2 mg total) by mouth 3 (three) times daily.  60 tablet  1  . fluconazole (DIFLUCAN) 150 MG tablet Take 150 mg by mouth once. TAKE  ONE TAB EVERY SEVEN DAYS UNTIL FINISHED      . furosemide (LASIX) 40 MG tablet Take 1 tablet (40 mg total) by mouth 2 (two) times daily.  60 tablet  0  . levothyroxine (SYNTHROID, LEVOTHROID) 100 MCG tablet Take 100 mcg by mouth daily before breakfast.      . lisinopril (PRINIVIL,ZESTRIL) 10 MG tablet Take 1 tablet (10 mg total) by mouth daily.  30 tablet  0  . warfarin (COUMADIN) 5 MG tablet Take 2.5-5 mg by mouth every evening. Take 5mg  on Tues and Sat, take 2.5mg  all other days       No current facility-administered medications for this visit.    Allergies:   No Known Allergies  Social History:  The patient  reports that she has never smoked. She has never used smokeless tobacco.  She reports that she does not drink alcohol or use illicit drugs.   ROS:  Please see the history of present illness.   She was recently treated with Diflucan for apparent thrush. All other systems reviewed and negative.   PHYSICAL EXAM: VS:  BP 125/65  Pulse 52  Ht 5\' 1"  (1.549 m)  Wt 259 lb (117.482 kg)  BMI 48.96 kg/m2 Well nourished, well developed, in no acute distress HEENT: normal Neck: no JVD at 90 Cardiac:  normal S1, S2; RRR; no murmur Lungs:  clear to auscultation bilaterally, no wheezing, rhonchi or rales Abd: soft, nontender, no hepatomegaly Ext: trace bilateral LE edema Skin: warm and dry Neuro:  CNs 2-12 intact, no focal abnormalities noted  EKG:  Sinus bradycardia, HR 52, QTc 483     ASSESSMENT AND PLAN:  1. Chronic Diastolic CHF: Her volume appears to be stable. She is leaving now to go see her primary care physician. I will obtain a basic metabolic panel from his office. I had a long discussion regarding weight herself daily. She knows to contact us if she gains 3 pounds in 1 day or 5 pounds in 1 week. 2. Atrial Fibrillation:  Maintaining sinus rhythm. She remains on Coumadin. She remains on amiodarone. LFTs were somewhat elevated a couple of months ago. Recent TSH was normal. I will obtain repeat LFTs today. 3. Hypertension: Controlled. 4. Disposition: Followup with Dr. Graciela Husbands in 2-3 months.  Signed, Tereso Newcomer, PA-C  06/08/2013 5:42 PM

## 2013-07-06 ENCOUNTER — Ambulatory Visit (INDEPENDENT_AMBULATORY_CARE_PROVIDER_SITE_OTHER): Payer: Medicare Other | Admitting: Pharmacist

## 2013-07-06 DIAGNOSIS — I4891 Unspecified atrial fibrillation: Secondary | ICD-10-CM

## 2013-07-06 DIAGNOSIS — Z7901 Long term (current) use of anticoagulants: Secondary | ICD-10-CM

## 2013-07-06 DIAGNOSIS — I4892 Unspecified atrial flutter: Secondary | ICD-10-CM

## 2013-07-13 ENCOUNTER — Ambulatory Visit (INDEPENDENT_AMBULATORY_CARE_PROVIDER_SITE_OTHER): Payer: Medicare Other | Admitting: Pulmonary Disease

## 2013-07-13 ENCOUNTER — Encounter: Payer: Self-pay | Admitting: Pulmonary Disease

## 2013-07-13 VITALS — BP 100/58 | HR 61 | Temp 96.7°F | Ht 61.0 in | Wt 260.4 lb

## 2013-07-13 DIAGNOSIS — G4733 Obstructive sleep apnea (adult) (pediatric): Secondary | ICD-10-CM

## 2013-07-13 NOTE — Progress Notes (Signed)
  Subjective:    Patient ID: [name], female    DOB: [birth year], 72 y.o.   MRN: [id number]  HPI The patient comes in today for followup of her obstructive sleep apnea.  She is wearing CPAP compliantly, and is having no issues with her mask fit or pressure.  She feels that she is sleeping well with the device, and is satisfied with her daytime alertness.  She went a period of time without her CPAP device, and into the hospital while on vacation with a CHF exacerbation.  The patient's weight is down 4 pounds since last visit with me.   Review of Systems  Constitutional: Negative for fever and unexpected weight change.  HENT: Negative for ear pain, nosebleeds, congestion, sore throat, rhinorrhea, sneezing, trouble swallowing, dental problem, postnasal drip and sinus pressure.   Eyes: Negative for redness and itching.  Respiratory: Positive for shortness of breath. Negative for cough, chest tightness and wheezing.   Cardiovascular: Negative for palpitations and leg swelling.  Gastrointestinal: Negative for nausea and vomiting.  Genitourinary: Negative for dysuria.  Musculoskeletal: Negative for joint swelling.  Skin: Negative for rash.  Neurological: Negative for headaches.  Hematological: Does not bruise/bleed easily.  Psychiatric/Behavioral: Negative for dysphoric mood. The patient is not nervous/anxious.        Objective:   Physical Exam Obese female in no acute distress Nose without purulence or discharge noted No skin breakdown or pressure necrosis from CPAP mask. Neck without lymphadenopathy or thyromegaly Lower extremities with 1+ edema, no cyanosis Alert and oriented, does not appear to be sleepy, moves all 4 extremities.       Assessment & Plan:

## 2013-07-13 NOTE — Patient Instructions (Addendum)
Continue with cpap, and keep up with mask cushion changes and supplies. Keep working on weight loss followup with me in one year.

## 2013-07-13 NOTE — Assessment & Plan Note (Signed)
The patient is doing very well with CPAP, and feels that she sleeps adequately and has excellent daytime alertness.  I have asked her to keep up with her mask changes and supplies, and to work aggressively on weight loss.  I will see her back in one year if she is doing well.

## 2013-07-25 ENCOUNTER — Ambulatory Visit (INDEPENDENT_AMBULATORY_CARE_PROVIDER_SITE_OTHER): Payer: Medicare Other | Admitting: *Deleted

## 2013-07-25 DIAGNOSIS — Z7901 Long term (current) use of anticoagulants: Secondary | ICD-10-CM

## 2013-07-25 DIAGNOSIS — I4891 Unspecified atrial fibrillation: Secondary | ICD-10-CM

## 2013-07-25 DIAGNOSIS — I4892 Unspecified atrial flutter: Secondary | ICD-10-CM

## 2013-07-25 LAB — POCT INR: INR: 3.3

## 2013-08-11 ENCOUNTER — Ambulatory Visit (INDEPENDENT_AMBULATORY_CARE_PROVIDER_SITE_OTHER): Payer: Medicare Other | Admitting: General Practice

## 2013-08-11 ENCOUNTER — Encounter: Payer: Self-pay | Admitting: Internal Medicine

## 2013-08-11 ENCOUNTER — Ambulatory Visit (INDEPENDENT_AMBULATORY_CARE_PROVIDER_SITE_OTHER): Payer: Medicare Other | Admitting: Internal Medicine

## 2013-08-11 VITALS — BP 145/68 | HR 55 | Ht 61.0 in | Wt 257.0 lb

## 2013-08-11 DIAGNOSIS — Z7901 Long term (current) use of anticoagulants: Secondary | ICD-10-CM

## 2013-08-11 DIAGNOSIS — I10 Essential (primary) hypertension: Secondary | ICD-10-CM

## 2013-08-11 DIAGNOSIS — I4891 Unspecified atrial fibrillation: Secondary | ICD-10-CM

## 2013-08-11 DIAGNOSIS — I5032 Chronic diastolic (congestive) heart failure: Secondary | ICD-10-CM

## 2013-08-11 DIAGNOSIS — R7402 Elevation of levels of lactic acid dehydrogenase (LDH): Secondary | ICD-10-CM

## 2013-08-11 DIAGNOSIS — I4892 Unspecified atrial flutter: Secondary | ICD-10-CM

## 2013-08-11 DIAGNOSIS — R7401 Elevation of levels of liver transaminase levels: Secondary | ICD-10-CM

## 2013-08-11 LAB — POCT INR: INR: 2.3

## 2013-08-11 MED ORDER — POTASSIUM CHLORIDE CRYS ER 20 MEQ PO TBCR: EXTENDED_RELEASE_TABLET | ORAL | Status: DC

## 2013-08-11 NOTE — Patient Instructions (Addendum)
Your physician has recommended you make the following change in your medication:  1) Increase lasix to 80mg  twice daily for 4 days, then return to 40 mg twice daily 2) Take Potassium 20 mEq for 4 days  Your physician recommends that you return for lab work on: Monday for CMET  We will be in touch once we have lab results about any follow up needed

## 2013-08-11 NOTE — Assessment & Plan Note (Signed)
There is evidence of volume overload. We will plan to increase her diuretic from 40 twice a day--80 . Potassium last week was 3.5 and creatinine 0.8.  We will do this for 4 days and recheck as metabolic profile next week

## 2013-08-11 NOTE — Assessment & Plan Note (Signed)
She has had transaminase elevation variably since June. We'll plan to recheck if we can try to obtain labs from her prolonged hospitalization in [place] to see whether they are coming down going up. As noted above we will decrease her amiodarone andfollowthisalong.Wedonothavegoodoptionsfortreatingheratrialfibrillationapartfromthis.

## 2013-08-11 NOTE — Progress Notes (Signed)
Patient Care Team: Mia Creek, MD as PCP - General (Internal Medicine) Sherren Mocha, MD as Resident (Family Medicine)   HPI  [name] is a 72 y.o. female seen in followup for atrial arrhythmias associated with diastolic heart failure. She's been tried on flecainide, dofetilide, and most recently has been on amiodarone.  Echocardiogram November 2012 demonstrated normal left ventricular function with mild LVH; atrial chambers were also normal in size  TSH 1.2 <11 6/13 <<22<<31   Hospitalized for A/C diastolic CHF and once for acute renal failure 2/2 UTI   She is seen today because Dr. Reche Dixon has noted a modest elevation (70s) in her transaminases. Records from [place] in July had demonstrated levels in the low 40s.  Records from June were 47/72  July 2013 they were normal. The concern is whether they're related to amiodarone.  She is also noted peripheral edema over recent weeks is a partially responsive to getting her feet up. Dyspnea is stable   Past Medical History  Diagnosis Date  . Atrial fibrillation /flutter   . Hypertension     a.  Renal arterial Dopplers 12/2011: 1-59% right renal artery stenosis  . Diabetes mellitus   . Hyperlipidemia   . Gout   . 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 Diastolic CHF, chronic     a.  echo 2006 - ef 55-65%; mild diast dysfxn;    b. Echo 08/2011: Mild LVH, EF 60%.   . Arthritis   . Morbid obesity   . OSA (obstructive sleep apnea)     uses cpap  . CHF (congestive heart failure)   . Back pain   . Chronic kidney disease     Past Surgical History  Procedure Laterality Date  . Cholecystectomy    . Total abdominal hysterectomy    . Appendectomy    . Tonsillectomy  1982  . Cardioversion  10/22/2011    Procedure: CARDIOVERSION;  Surgeon: Duke Salvia, MD;  Location: [facility] OR;  Service: Cardiovascular;  Laterality: N/A;    Current Outpatient Prescriptions  Medication Sig Dispense Refill  .  amiodarone (PACERONE) 200 MG tablet Take 1.5 tablets (300 mg total) by mouth 2 (two) times daily.  90 tablet  3  . amLODipine (NORVASC) 5 MG tablet Take 2 tablets (10 mg total) by mouth daily.  30 tablet  0  . cloNIDine (CATAPRES) 0.2 MG tablet Take 1 tablet (0.2 mg total) by mouth 3 (three) times daily.  60 tablet  1  . furosemide (LASIX) 40 MG tablet Take 1 tablet (40 mg total) by mouth 2 (two) times daily.  60 tablet  0  . levothyroxine (SYNTHROID, LEVOTHROID) 100 MCG tablet Take 100 mcg by mouth daily before breakfast.      . lisinopril (PRINIVIL,ZESTRIL) 10 MG tablet Take 1 tablet (10 mg total) by mouth daily.  30 tablet  0  . warfarin (COUMADIN) 5 MG tablet Take as directed by anticoagulation clinic      . allopurinol (ZYLOPRIM) 300 MG tablet Take one tablet  daily      . metFORMIN (GLUCOPHAGE) 500 MG tablet Take one tablet daily       No current facility-administered medications for this visit.    No Known Allergies  Review of Systems negative except from HPI and PMH  Physical Exam BP 145/68  Pulse 55  Ht 5\' 1"  (1.549 m)  Wt 257 lb (116.574 kg)  BMI 48.58 kg/m2  SpO2 94% Well developed and well nourished in no acute distress HENT normal E scleral and icterus clear Neck Suppl Clear to ausculation  Regular rate and rhythm, no murmurs gallops or rub Soft with active bowel sounds No clubbing cyanosis 2+ Edema Alert and oriented, grossly normal motor and sensory function Skin Warm and Dry   ECG >>NSR  Assessment and  Plan

## 2013-08-11 NOTE — Assessment & Plan Note (Signed)
Holding sinus rhythm on amiodarone. We will decrease the dose apparently from 300--200 mg a day

## 2013-08-15 ENCOUNTER — Other Ambulatory Visit: Payer: Medicare Other

## 2013-08-16 ENCOUNTER — Other Ambulatory Visit: Payer: Self-pay | Admitting: *Deleted

## 2013-08-16 ENCOUNTER — Other Ambulatory Visit (INDEPENDENT_AMBULATORY_CARE_PROVIDER_SITE_OTHER): Payer: Medicare Other

## 2013-08-16 ENCOUNTER — Telehealth: Payer: Self-pay | Admitting: *Deleted

## 2013-08-16 DIAGNOSIS — E876 Hypokalemia: Secondary | ICD-10-CM

## 2013-08-16 DIAGNOSIS — I4891 Unspecified atrial fibrillation: Secondary | ICD-10-CM

## 2013-08-16 LAB — COMPREHENSIVE METABOLIC PANEL
ALT: 84 U/L — ABNORMAL HIGH (ref 0–35)
Albumin: 3.3 g/dL — ABNORMAL LOW (ref 3.5–5.2)
Alkaline Phosphatase: 127 U/L — ABNORMAL HIGH (ref 39–117)
CO2: 33 mEq/L — ABNORMAL HIGH (ref 19–32)
Calcium: 8.9 mg/dL (ref 8.4–10.5)
Chloride: 97 mEq/L (ref 96–112)
Glucose, Bld: 285 mg/dL — ABNORMAL HIGH (ref 70–99)
Potassium: 3 mEq/L — ABNORMAL LOW (ref 3.5–5.1)
Sodium: 139 mEq/L (ref 135–145)
Total Bilirubin: 0.7 mg/dL (ref 0.3–1.2)
Total Protein: 7.1 g/dL (ref 6.0–8.3)

## 2013-08-16 MED ORDER — POTASSIUM CHLORIDE CRYS ER 20 MEQ PO TBCR: EXTENDED_RELEASE_TABLET | ORAL | Status: DC

## 2013-08-16 NOTE — Telephone Encounter (Signed)
Called patient to get [place] hospital information to fax release - patient states that she has the paperwork and will bring it by the office.

## 2013-08-19 ENCOUNTER — Other Ambulatory Visit: Payer: Self-pay | Admitting: *Deleted

## 2013-08-19 ENCOUNTER — Telehealth: Payer: Self-pay | Admitting: *Deleted

## 2013-08-19 DIAGNOSIS — R748 Abnormal levels of other serum enzymes: Secondary | ICD-10-CM

## 2013-08-19 DIAGNOSIS — E876 Hypokalemia: Secondary | ICD-10-CM

## 2013-08-19 MED ORDER — POTASSIUM CHLORIDE CRYS ER 20 MEQ PO TBCR: 20.0000 meq | EXTENDED_RELEASE_TABLET | Freq: Two times a day (BID) | ORAL | Status: DC

## 2013-08-19 NOTE — Telephone Encounter (Signed)
Reviewed lab results with patient - advised her to take Potassium 20 mEq BID for 3 days, and I will sent prescription in today. She will be in on Tuesday to have follow up CMET, then again on 11/21 to evaluate changes. Patient verbalized understanding and agreeable to plan.

## 2013-08-23 ENCOUNTER — Telehealth: Payer: Self-pay | Admitting: *Deleted

## 2013-08-23 ENCOUNTER — Other Ambulatory Visit (INDEPENDENT_AMBULATORY_CARE_PROVIDER_SITE_OTHER): Payer: Medicare Other

## 2013-08-23 DIAGNOSIS — R748 Abnormal levels of other serum enzymes: Secondary | ICD-10-CM

## 2013-08-23 DIAGNOSIS — E876 Hypokalemia: Secondary | ICD-10-CM

## 2013-08-23 LAB — COMPREHENSIVE METABOLIC PANEL
AST: 110 U/L — ABNORMAL HIGH (ref 0–37)
Alkaline Phosphatase: 125 U/L — ABNORMAL HIGH (ref 39–117)
CO2: 32 mEq/L (ref 19–32)
Creatinine, Ser: 0.8 mg/dL (ref 0.4–1.2)
GFR: 78.8 mL/min (ref >60.00)
Glucose, Bld: 183 mg/dL — ABNORMAL HIGH (ref 70–99)
Sodium: 137 mEq/L (ref 135–145)
Total Bilirubin: 0.5 mg/dL (ref 0.3–1.2)
Total Protein: 7.3 g/dL (ref 6.0–8.3)

## 2013-08-23 NOTE — Telephone Encounter (Signed)
LEFT  VOICE  MAIL FOR PT TO CALL BACK RE  LAB  CALLING WITH  CRITICAL  K   OF  2.6  NEEDING TO MAKE SURE  WHAT  PT IS  TAKING FOR SUPPLEMENT .[name]

## 2013-08-24 NOTE — Telephone Encounter (Signed)
SEE LAB RESULTS    LOW  K  HAS BEEN ADDRESSED .[name]

## 2013-08-25 ENCOUNTER — Telehealth: Payer: Self-pay | Admitting: Internal Medicine

## 2013-08-25 NOTE — Telephone Encounter (Signed)
Follow up     Pt returning call she thinks it is for her results?

## 2013-08-26 ENCOUNTER — Other Ambulatory Visit: Payer: Self-pay | Admitting: *Deleted

## 2013-08-26 DIAGNOSIS — E876 Hypokalemia: Secondary | ICD-10-CM

## 2013-08-26 MED ORDER — POTASSIUM CHLORIDE CRYS ER 20 MEQ PO TBCR: EXTENDED_RELEASE_TABLET | ORAL | Status: DC

## 2013-08-26 NOTE — Telephone Encounter (Signed)
Called patient to check if anyone had talked to her about her lab work Monday or made any medication changes secondary to those results - patient stated she has talked to no one and no medication changes have been made this week. Discussed with Dr. Jens Som (DOD) - orders received Potassium 40 x1 then repeat 6 hours later tonight. Then increase potassium dose to 40 mEq BID. BMET on Monday. Patient verbalized understanding and agreeable to plan.

## 2013-08-29 ENCOUNTER — Other Ambulatory Visit: Payer: Medicare Other

## 2013-08-31 NOTE — Telephone Encounter (Signed)
Called patient to see why she missed her lab work Monday - she states she fell twice last week, once Thursday and then on Sunday. She is going to try and come in tomorrow for her coumadin check and have lab work then. I will follow up on this to make sure she gets potassium rechecked.

## 2013-09-02 ENCOUNTER — Ambulatory Visit (INDEPENDENT_AMBULATORY_CARE_PROVIDER_SITE_OTHER): Payer: Medicare Other | Admitting: *Deleted

## 2013-09-02 ENCOUNTER — Other Ambulatory Visit (INDEPENDENT_AMBULATORY_CARE_PROVIDER_SITE_OTHER): Payer: Medicare Other

## 2013-09-02 DIAGNOSIS — I4892 Unspecified atrial flutter: Secondary | ICD-10-CM

## 2013-09-02 DIAGNOSIS — R748 Abnormal levels of other serum enzymes: Secondary | ICD-10-CM

## 2013-09-02 DIAGNOSIS — Z7901 Long term (current) use of anticoagulants: Secondary | ICD-10-CM

## 2013-09-02 DIAGNOSIS — I4891 Unspecified atrial fibrillation: Secondary | ICD-10-CM

## 2013-09-02 DIAGNOSIS — E876 Hypokalemia: Secondary | ICD-10-CM

## 2013-09-02 LAB — COMPREHENSIVE METABOLIC PANEL
BUN: 15 mg/dL (ref 6–23)
CO2: 29 mEq/L (ref 19–32)
Calcium: 9.1 mg/dL (ref 8.4–10.5)
Chloride: 100 mEq/L (ref 96–112)
Creatinine, Ser: 0.9 mg/dL (ref 0.4–1.2)
GFR: 62.59 mL/min (ref >60.00)
Glucose, Bld: 314 mg/dL — ABNORMAL HIGH (ref 70–99)

## 2013-09-02 LAB — POCT INR: INR: 2.5

## 2013-09-02 NOTE — Telephone Encounter (Signed)
New Problem  Pt states she is at [facility] in [place]. Pt states she thought her doctor referred her there for an appt today. Pt states they are telling her she does not have an appt. Pt would like to talk to the nurse

## 2013-09-02 NOTE — Telephone Encounter (Signed)
Left message on patient's personal answering machine. I explained that Dr. Graciela Husbands did not refer her there. I see that she saw coumadin clinic this morning and I told her that if they told her to follow up with someone, then to call back to office and speak with someone in coumadin clinic to get that answered.

## 2013-09-07 ENCOUNTER — Telehealth: Payer: Self-pay | Admitting: Internal Medicine

## 2013-09-07 NOTE — Telephone Encounter (Signed)
New message ° ° ° ° °Want lab results °

## 2013-09-09 ENCOUNTER — Other Ambulatory Visit: Payer: Medicare Other

## 2013-09-09 ENCOUNTER — Other Ambulatory Visit: Payer: Self-pay | Admitting: *Deleted

## 2013-09-09 DIAGNOSIS — E876 Hypokalemia: Secondary | ICD-10-CM

## 2013-09-09 NOTE — Telephone Encounter (Signed)
Patient aware of her lab results. Will check Potassium again on 12/1 to see how she is handling increased dosage of Potassium supplement. Patient agreeable to plan.

## 2013-09-12 ENCOUNTER — Other Ambulatory Visit (INDEPENDENT_AMBULATORY_CARE_PROVIDER_SITE_OTHER): Payer: Medicare Other

## 2013-09-12 DIAGNOSIS — E876 Hypokalemia: Secondary | ICD-10-CM

## 2013-09-12 LAB — BASIC METABOLIC PANEL
BUN: 13 mg/dL (ref 6–23)
Calcium: 8.9 mg/dL (ref 8.4–10.5)
Chloride: 99 mEq/L (ref 96–112)
GFR: 76.49 mL/min (ref >60.00)
Potassium: 4.5 mEq/L (ref 3.5–5.1)

## 2013-09-19 ENCOUNTER — Other Ambulatory Visit: Payer: Medicare Other

## 2013-09-21 ENCOUNTER — Telehealth: Payer: Self-pay | Admitting: Internal Medicine

## 2013-09-21 NOTE — Telephone Encounter (Signed)
New Problem:  Pt is requesting a call back with her lab results. Pt also states a while back she was told her liver function was elevated. Pt is wanting to know if her liver was checked in her last set of blood work.

## 2013-09-21 NOTE — Telephone Encounter (Signed)
Spoke with patient and reported lab results.  Patient would like to know if she is to continue KCl 40 mEq BID.  Patient would also like to know if her liver enzymes need to be rechecked since they were elevated a few weeks ago.  I advised patient that Dr. Graciela Husbands and his primary nurse, Dory Horn, RN are not in the office today but will return tomorrow and that I will send message to them.  Patient verbalized understanding and agreement.

## 2013-09-22 ENCOUNTER — Other Ambulatory Visit: Payer: Self-pay | Admitting: *Deleted

## 2013-09-22 DIAGNOSIS — R748 Abnormal levels of other serum enzymes: Secondary | ICD-10-CM

## 2013-09-22 DIAGNOSIS — E876 Hypokalemia: Secondary | ICD-10-CM

## 2013-09-22 NOTE — Telephone Encounter (Signed)
Advised patient we would like to follow up on liver enzymes in 2 weeks - lab scheduled for 10/04/13. Patient agreeable to plan.

## 2013-10-03 ENCOUNTER — Emergency Department (HOSPITAL_COMMUNITY): Payer: Medicare Other

## 2013-10-03 ENCOUNTER — Inpatient Hospital Stay (HOSPITAL_COMMUNITY)
Admission: EM | Admit: 2013-10-03 | Discharge: 2013-10-05 | DRG: 292 | Disposition: A | Discharge status: Home Health | Payer: Medicare Other | Attending: Internal Medicine | Admitting: Internal Medicine

## 2013-10-03 ENCOUNTER — Encounter (HOSPITAL_COMMUNITY): Payer: Self-pay | Admitting: Emergency Medicine

## 2013-10-03 DIAGNOSIS — I5031 Acute diastolic (congestive) heart failure: Secondary | ICD-10-CM | POA: Diagnosis present

## 2013-10-03 DIAGNOSIS — E1159 Type 2 diabetes mellitus with other circulatory complications: Secondary | ICD-10-CM | POA: Diagnosis present

## 2013-10-03 DIAGNOSIS — I10 Essential (primary) hypertension: Secondary | ICD-10-CM

## 2013-10-03 DIAGNOSIS — I4892 Unspecified atrial flutter: Secondary | ICD-10-CM | POA: Diagnosis present

## 2013-10-03 DIAGNOSIS — Z91199 Patient's noncompliance with other medical treatment and regimen due to unspecified reason: Secondary | ICD-10-CM

## 2013-10-03 DIAGNOSIS — Z803 Family history of malignant neoplasm of breast: Secondary | ICD-10-CM

## 2013-10-03 DIAGNOSIS — I509 Heart failure, unspecified: Secondary | ICD-10-CM

## 2013-10-03 DIAGNOSIS — I701 Atherosclerosis of renal artery: Secondary | ICD-10-CM | POA: Diagnosis present

## 2013-10-03 DIAGNOSIS — E876 Hypokalemia: Secondary | ICD-10-CM | POA: Diagnosis present

## 2013-10-03 DIAGNOSIS — M109 Gout, unspecified: Secondary | ICD-10-CM | POA: Diagnosis present

## 2013-10-03 DIAGNOSIS — E785 Hyperlipidemia, unspecified: Secondary | ICD-10-CM | POA: Diagnosis present

## 2013-10-03 DIAGNOSIS — Z7901 Long term (current) use of anticoagulants: Secondary | ICD-10-CM

## 2013-10-03 DIAGNOSIS — T462X5A Adverse effect of other antidysrhythmic drugs, initial encounter: Secondary | ICD-10-CM | POA: Diagnosis present

## 2013-10-03 DIAGNOSIS — N189 Chronic kidney disease, unspecified: Secondary | ICD-10-CM | POA: Diagnosis present

## 2013-10-03 DIAGNOSIS — Z8249 Family history of ischemic heart disease and other diseases of the circulatory system: Secondary | ICD-10-CM

## 2013-10-03 DIAGNOSIS — R0600 Dyspnea, unspecified: Secondary | ICD-10-CM | POA: Insufficient documentation

## 2013-10-03 DIAGNOSIS — M129 Arthropathy, unspecified: Secondary | ICD-10-CM | POA: Diagnosis present

## 2013-10-03 DIAGNOSIS — R7401 Elevation of levels of liver transaminase levels: Secondary | ICD-10-CM | POA: Diagnosis present

## 2013-10-03 DIAGNOSIS — I480 Paroxysmal atrial fibrillation: Secondary | ICD-10-CM | POA: Diagnosis present

## 2013-10-03 DIAGNOSIS — R9431 Abnormal electrocardiogram [ECG] [EKG]: Secondary | ICD-10-CM

## 2013-10-03 DIAGNOSIS — I4891 Unspecified atrial fibrillation: Secondary | ICD-10-CM | POA: Diagnosis present

## 2013-10-03 DIAGNOSIS — I129 Hypertensive chronic kidney disease with stage 1 through stage 4 chronic kidney disease, or unspecified chronic kidney disease: Secondary | ICD-10-CM | POA: Diagnosis present

## 2013-10-03 DIAGNOSIS — T50995A Adverse effect of other drugs, medicaments and biological substances, initial encounter: Secondary | ICD-10-CM | POA: Diagnosis present

## 2013-10-03 DIAGNOSIS — Z9119 Patient's noncompliance with other medical treatment and regimen: Secondary | ICD-10-CM

## 2013-10-03 DIAGNOSIS — G4733 Obstructive sleep apnea (adult) (pediatric): Secondary | ICD-10-CM | POA: Diagnosis present

## 2013-10-03 DIAGNOSIS — I152 Hypertension secondary to endocrine disorders: Secondary | ICD-10-CM | POA: Diagnosis present

## 2013-10-03 DIAGNOSIS — E119 Type 2 diabetes mellitus without complications: Secondary | ICD-10-CM

## 2013-10-03 DIAGNOSIS — Z6841 Body Mass Index (BMI) 40.0 and over, adult: Secondary | ICD-10-CM

## 2013-10-03 DIAGNOSIS — I5033 Acute on chronic diastolic (congestive) heart failure: Principal | ICD-10-CM | POA: Diagnosis present

## 2013-10-03 DIAGNOSIS — R7402 Elevation of levels of lactic acid dehydrogenase (LDH): Secondary | ICD-10-CM

## 2013-10-03 HISTORY — DX: Long term (current) use of anticoagulants: Z79.01

## 2013-10-03 HISTORY — DX: Obstructive sleep apnea (adult) (pediatric): G47.33

## 2013-10-03 HISTORY — DX: Dependence on other enabling machines and devices: Z99.89

## 2013-10-03 LAB — URINALYSIS W MICROSCOPIC + REFLEX CULTURE
Glucose, UA: 250 mg/dL — AB
Leukocytes, UA: NEGATIVE
Nitrite: NEGATIVE
Protein, ur: 30 mg/dL — AB
pH: 8 (ref 5.0–8.0)

## 2013-10-03 LAB — COMPREHENSIVE METABOLIC PANEL
ALT: 87 U/L — ABNORMAL HIGH (ref 0–35)
Alkaline Phosphatase: 153 U/L — ABNORMAL HIGH (ref 39–117)
BUN: 11 mg/dL (ref 6–23)
CO2: 30 mEq/L (ref 19–32)
Calcium: 8.5 mg/dL (ref 8.4–10.5)
GFR calc Af Amer: >90 mL/min (ref >90)
GFR calc non Af Amer: 88 mL/min — ABNORMAL LOW (ref >90)
Glucose, Bld: 213 mg/dL — ABNORMAL HIGH (ref 70–99)
Sodium: 141 mEq/L (ref 135–145)

## 2013-10-03 LAB — CBC WITH DIFFERENTIAL/PLATELET
Basophils Absolute: 0 10*3/uL (ref 0.0–0.1)
Basophils Relative: 0 % (ref 0–1)
Eosinophils Absolute: 0.1 10*3/uL (ref 0.0–0.7)
Eosinophils Relative: 2 % (ref 0–5)
Lymphocytes Relative: 15 % (ref 12–46)
Lymphs Abs: 1.4 10*3/uL (ref 0.7–4.0)
MCH: 25.1 pg — ABNORMAL LOW (ref 26.0–34.0)
MCV: 77.6 fL — ABNORMAL LOW (ref 78.0–100.0)
Neutro Abs: 6.8 10*3/uL (ref 1.7–7.7)
Neutrophils Relative %: 72 % (ref 43–77)
Platelets: 369 10*3/uL (ref 150–400)
RDW: 15.7 % — ABNORMAL HIGH (ref 11.5–15.5)
WBC: 9.4 10*3/uL (ref 4.0–10.5)

## 2013-10-03 LAB — BASIC METABOLIC PANEL
CO2: 28 mEq/L (ref 19–32)
Calcium: 8.9 mg/dL (ref 8.4–10.5)
Creatinine, Ser: 0.57 mg/dL (ref 0.50–1.10)
GFR calc non Af Amer: >90 mL/min (ref >90)
Potassium: 3.4 mEq/L — ABNORMAL LOW (ref 3.5–5.1)

## 2013-10-03 LAB — GLUCOSE, CAPILLARY: Glucose-Capillary: 161 mg/dL — ABNORMAL HIGH (ref 70–99)

## 2013-10-03 LAB — CBC
HCT: 37.5 % (ref 36.0–46.0)
Hemoglobin: 12.1 g/dL (ref 12.0–15.0)
MCH: 25.5 pg — ABNORMAL LOW (ref 26.0–34.0)
MCHC: 32.3 g/dL (ref 30.0–36.0)
Platelets: 366 10*3/uL (ref 150–400)
RBC: 4.75 MIL/uL (ref 3.87–5.11)
WBC: 10.1 10*3/uL (ref 4.0–10.5)

## 2013-10-03 LAB — MAGNESIUM: Magnesium: 1.7 mg/dL (ref 1.5–2.5)

## 2013-10-03 LAB — PRO B NATRIURETIC PEPTIDE: Pro B Natriuretic peptide (BNP): 396.9 pg/mL — ABNORMAL HIGH (ref 0–125)

## 2013-10-03 LAB — PROTIME-INR: Prothrombin Time: 22.1 seconds — ABNORMAL HIGH (ref 11.6–15.2)

## 2013-10-03 MED ORDER — WARFARIN - PHARMACIST DOSING INPATIENT: Freq: Every day | Status: DC

## 2013-10-03 MED ORDER — HEPARIN SODIUM (PORCINE) 5000 UNIT/ML IJ SOLN
5000.0000 [IU] | Freq: Three times a day (TID) | INTRAMUSCULAR | Status: DC
  Filled 2013-10-03 (×2): qty 1

## 2013-10-03 MED ORDER — INSULIN ASPART 100 UNIT/ML ~~LOC~~ SOLN
0.0000 [IU] | Freq: Three times a day (TID) | SUBCUTANEOUS | Status: DC
  Administered 2013-10-04 (×2): 3 [IU] via SUBCUTANEOUS
  Administered 2013-10-05 (×2): 2 [IU] via SUBCUTANEOUS

## 2013-10-03 MED ORDER — INSULIN ASPART 100 UNIT/ML ~~LOC~~ SOLN
0.0000 [IU] | Freq: Every day | SUBCUTANEOUS | Status: DC
  Administered 2013-10-03: 2 [IU] via SUBCUTANEOUS

## 2013-10-03 MED ORDER — ACETAMINOPHEN 325 MG PO TABS: 650.0000 mg | ORAL_TABLET | ORAL | Status: DC | PRN

## 2013-10-03 MED ORDER — FUROSEMIDE 10 MG/ML IJ SOLN
40.0000 mg | Freq: Two times a day (BID) | INTRAMUSCULAR | Status: DC
  Administered 2013-10-03 – 2013-10-05 (×4): 40 mg via INTRAVENOUS
  Filled 2013-10-03 (×6): qty 4

## 2013-10-03 MED ORDER — LISINOPRIL 10 MG PO TABS
10.0000 mg | ORAL_TABLET | Freq: Every day | ORAL | Status: DC
  Administered 2013-10-03 – 2013-10-05 (×3): 10 mg via ORAL
  Filled 2013-10-03 (×3): qty 1

## 2013-10-03 MED ORDER — INSULIN ASPART 100 UNIT/ML ~~LOC~~ SOLN
6.0000 [IU] | Freq: Three times a day (TID) | SUBCUTANEOUS | Status: DC
  Administered 2013-10-04 – 2013-10-05 (×5): 6 [IU] via SUBCUTANEOUS

## 2013-10-03 MED ORDER — SODIUM CHLORIDE 0.9 % IV SOLN: 250.0000 mL | INTRAVENOUS | Status: DC | PRN

## 2013-10-03 MED ORDER — SODIUM CHLORIDE 0.9 % IJ SOLN: 3.0000 mL | INTRAMUSCULAR | Status: DC | PRN

## 2013-10-03 MED ORDER — MAGNESIUM SULFATE 50 % IJ SOLN
3.0000 g | Freq: Once | INTRAVENOUS | Status: AC
  Administered 2013-10-03: 3 g via INTRAVENOUS
  Filled 2013-10-03: qty 6

## 2013-10-03 MED ORDER — FUROSEMIDE 10 MG/ML IJ SOLN
40.0000 mg | Freq: Once | INTRAMUSCULAR | Status: AC
  Administered 2013-10-03: 40 mg via INTRAVENOUS
  Filled 2013-10-03: qty 4

## 2013-10-03 MED ORDER — INFLUENZA VAC SPLIT QUAD 0.5 ML IM SUSP
0.5000 mL | INTRAMUSCULAR | Status: AC
  Administered 2013-10-04: 0.5 mL via INTRAMUSCULAR
  Filled 2013-10-03: qty 0.5

## 2013-10-03 MED ORDER — CLONIDINE HCL 0.2 MG PO TABS
0.2000 mg | ORAL_TABLET | Freq: Two times a day (BID) | ORAL | Status: DC
Start: 2013-10-03 — End: 2013-10-05
  Administered 2013-10-03 – 2013-10-05 (×4): 0.2 mg via ORAL
  Filled 2013-10-03 (×6): qty 1

## 2013-10-03 MED ORDER — LEVOTHYROXINE SODIUM 100 MCG PO TABS
100.0000 ug | ORAL_TABLET | Freq: Every day | ORAL | Status: DC
Start: 2013-10-04 — End: 2013-10-05
  Administered 2013-10-04 – 2013-10-05 (×2): 100 ug via ORAL
  Filled 2013-10-03 (×3): qty 1

## 2013-10-03 MED ORDER — ONDANSETRON HCL 4 MG/2ML IJ SOLN: 4.0000 mg | Freq: Four times a day (QID) | INTRAMUSCULAR | Status: DC | PRN

## 2013-10-03 MED ORDER — SODIUM CHLORIDE 0.9 % IJ SOLN
3.0000 mL | Freq: Two times a day (BID) | INTRAMUSCULAR | Status: DC
  Administered 2013-10-04 – 2013-10-05 (×3): 3 mL via INTRAVENOUS

## 2013-10-03 MED ORDER — POTASSIUM CHLORIDE CRYS ER 20 MEQ PO TBCR: 40.0000 meq | EXTENDED_RELEASE_TABLET | Freq: Two times a day (BID) | ORAL | Status: DC

## 2013-10-03 MED ORDER — ASPIRIN EC 81 MG PO TBEC
81.0000 mg | DELAYED_RELEASE_TABLET | Freq: Every day | ORAL | Status: DC
  Filled 2013-10-03: qty 1

## 2013-10-03 MED ORDER — WARFARIN SODIUM 2.5 MG PO TABS: 2.5000 mg | ORAL_TABLET | Freq: Every day | ORAL | Status: DC

## 2013-10-03 MED ORDER — POTASSIUM CHLORIDE CRYS ER 20 MEQ PO TBCR
40.0000 meq | EXTENDED_RELEASE_TABLET | Freq: Two times a day (BID) | ORAL | Status: DC
  Filled 2013-10-03 (×2): qty 2

## 2013-10-03 MED ORDER — WARFARIN SODIUM 5 MG PO TABS
5.0000 mg | ORAL_TABLET | Freq: Once | ORAL | Status: AC
  Administered 2013-10-03: 5 mg via ORAL
  Filled 2013-10-03: qty 1

## 2013-10-03 MED ORDER — ALLOPURINOL 300 MG PO TABS
300.0000 mg | ORAL_TABLET | Freq: Every day | ORAL | Status: DC
  Administered 2013-10-03 – 2013-10-05 (×3): 300 mg via ORAL
  Filled 2013-10-03 (×3): qty 1

## 2013-10-03 NOTE — ED Notes (Signed)
Pt placed on 2L [place] for comfort. MD notified.

## 2013-10-03 NOTE — Progress Notes (Signed)
ANTICOAGULATION CONSULT NOTE - Initial Consult  Pharmacy Consult for coumadin Indication: atrial fibrillation  No Known Allergies  Patient Measurements: Height: 5\' 1"  (154.9 cm) Weight: 242 lb 11.6 oz (110.1 kg) IBW/kg (Calculated) : 47.8 Heparin Dosing Weight:   Vital Signs: Temp: 97.8 F (36.6 C) (12/15 2147) Temp src: Oral (12/15 2147) BP: 128/50 mmHg (12/15 2147) Pulse Rate: 56 (12/15 2147)  Labs:  Recent Labs  10/03/13 1208 10/03/13 1905  HGB 12.9 12.1  HCT 39.8 37.5  PLT 369 366  LABPROT 22.1*  --   INR 2.00*  --   CREATININE 0.57 0.67  TROPONINI <0.30  --     Estimated Creatinine Clearance: 76.2 ml/min (by C-G formula based on Cr of 0.67).   Medical History: Past Medical History  Diagnosis Date  . Atrial fibrillation /flutter   . Hypertension     a.  Renal arterial Dopplers 12/2011: 1-59% right renal artery stenosis  . Diabetes mellitus   . Hyperlipidemia   . Gout   . 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 Diastolic CHF, chronic     a.  echo 2006 - ef 55-65%; mild diast dysfxn;    b. Echo 08/2011: Mild LVH, EF 60%.   . Arthritis   . Morbid obesity   . CHF (congestive heart failure)   . Back pain   . Chronic kidney disease   . Obstructive sleep apnea on CPAP   . Chronic anticoagulation     due to afib    Medications:  Scheduled:  . allopurinol  300 mg Oral Daily  . cloNIDine  0.2 mg Oral BID  . furosemide  40 mg Intravenous Q12H  . [START ON 10/04/2013] influenza vac split quadrivalent PF  0.5 mL Intramuscular Tomorrow-1000  . [START ON 10/04/2013] insulin aspart  0-15 Units Subcutaneous TID WC  . insulin aspart  0-5 Units Subcutaneous QHS  . [START ON 10/04/2013] insulin aspart  6 Units Subcutaneous TID WC  . [START ON 10/04/2013] levothyroxine  100 mcg Oral QAC breakfast  . lisinopril  10 mg Oral Daily  . [START ON 10/04/2013] potassium chloride  40 mEq Oral BID  . sodium chloride  3 mL Intravenous Q12H  . [START  ON 10/04/2013] Warfarin - Pharmacist Dosing Inpatient   Does not apply q1800    Assessment: Pt has been on coumadin for a.fib prior to admission. Her home dose is 5 mg on Sat and 2.5 mg the other days. Her INR is 2.0. She is admitted to get her amiodarone dose changed. She was taking 300 mg bid and was told to cut back to 200 mg daily in October but she didn't understand and continued to take the bid dose. 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   Goal of Therapy:  INR 2-3    Plan:  Coumadin 5 mg today.  Daily PT/INR  Eugene Garnet 10/03/2013,11:32 PM

## 2013-10-03 NOTE — ED Notes (Signed)
MD at bedside. 

## 2013-10-03 NOTE — Consult Note (Addendum)
CARDIOLOGY CONSULT NOTE     Patient ID: [name] MRN: [id number] DOB/AGE: 20-Nov-1940 72 y.o.  Admit date: 10/03/2013 Referring Physician Rosine Beat MD Primary Physician Donia Guiles MD Primary Cardiologist Berton Mount MD Reason for Consultation CHF, amiodarone toxicity.  HPI: [name] is seen at the request of Dr. Robb Matar for evaluation of congestive heart failure and concern over her amiodarone dose.She is a pleasant 72 year old white female with history of atrial fibrillation. She is on chronic Coumadin therapy.She has a history of diastolic heart failure. She has previously failed flecainide and dofetilide. Echocardiogram in November 2012 showed normal LV function with mild LVH. Atrial sizes were normal. She had DC cardioversion in November of 2012 and apparently has been on amiodarone since that time.She was admitted to the hospital in [place] of 2014 with congestive heart failure. Echo showed normal LV function.She was seen by Dr. Graciela Husbands on 08/11/2013. It was noted at that time that she had elevated transaminases. There was concern that this was related to amiodarone. She had been taking 300 mg twice a day. According to Dr. Graciela Husbands study recommended reducing her dose to 200 mg daily. Patient states this was never told to her and she has remained on 300 mg twice a day.Yesterday she complained of feeling more fatigued than normal. Today she noted that she had difficulty just walking to the bathroom because of shortness of breath. She denies any increase in edema or change in weight. She reports that she follows a low-sodium diet.She denies any chest pain or palpitations.  Review of systems complete and found to be negative unless listed above   Past Medical History  Diagnosis Date  . Atrial fibrillation /flutter   . Hypertension     a.  Renal arterial Dopplers 12/2011: 1-59% right renal artery stenosis  . Diabetes mellitus   . Hyperlipidemia   . Gout   . 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 Diastolic CHF, chronic     a.  echo 2006 - ef 55-65%; mild diast dysfxn;    b. Echo 08/2011: Mild LVH, EF 60%.   . Arthritis   . Morbid obesity   . CHF (congestive heart failure)   . Back pain   . Chronic kidney disease   . Obstructive sleep apnea on CPAP   . Chronic anticoagulation     due to afib    Family History  Problem Relation Age of Onset  . Heart disease Father   . Hypertension Father   . Breast cancer Sister   . Cancer Sister     breast    History   Social History  . Marital Status: Married    Spouse Name: N/A    Number of Children: Y  . Years of Education: N/A   Occupational History  . DISABILITY/housewife    Social History Main Topics  . Smoking status: Never Smoker   . Smokeless tobacco: Never Used  . Alcohol Use: No  . Drug Use: No  . Sexual Activity: Yes   Other Topics Concern  . Not on file   Social History Narrative  . No narrative on file    Past Surgical History  Procedure Laterality Date  . Cholecystectomy    . Total abdominal hysterectomy    . Appendectomy    . Tonsillectomy  1982  . Cardioversion  10/22/2011    Procedure: CARDIOVERSION;  Surgeon: Duke Salvia, MD;  Location: [facility] OR;  Service: Cardiovascular;  Laterality:  N/A;     Prescriptions prior to admission  Medication Sig Dispense Refill  . allopurinol (ZYLOPRIM) 300 MG tablet Take 300 mg by mouth daily. Take one tablet  daily      . amiodarone (PACERONE) 200 MG tablet Take 1.5 tablets (300 mg total) by mouth 2 (two) times daily.  90 tablet  3  . amLODipine (NORVASC) 5 MG tablet Take 5 mg by mouth daily.      . cloNIDine (CATAPRES) 0.2 MG tablet Take 0.2 mg by mouth 2 (two) times daily.      . furosemide (LASIX) 40 MG tablet Take 40 mg by mouth daily.      Marland Kitchen levothyroxine (SYNTHROID, LEVOTHROID) 100 MCG tablet Take 100 mcg by mouth daily before breakfast.      . lisinopril (PRINIVIL,ZESTRIL) 10 MG tablet Take 1 tablet (10 mg total) by  mouth daily.  30 tablet  0  . metFORMIN (GLUCOPHAGE) 500 MG tablet Take one tablet daily      . potassium chloride SA (K-DUR,KLOR-CON) 20 MEQ tablet Take 2 tablets (40 mEq total) by mouth twice a day.  120 tablet  3  . warfarin (COUMADIN) 5 MG tablet Take 2.5-5 mg by mouth daily. 5 mg on Saturdays and 2.5 mg all other days.        Physical Exam: Blood pressure 165/60, pulse 60, temperature 97.6 F (36.4 C), temperature source Oral, resp. rate 19, height 5\' 1"  (1.549 m), weight 257 lb (116.574 kg), SpO2 95.00%.  Patient is a morbidly obese Hispanic female in no acute distress. HEENT: Normocephalic, atraumatic. Pupils equal round and reactive to light accommodation. Sclera are clear. Oropharynx is clear. Neck: Supple without JVD, adenopathy, thyromegaly, or bruits. Lungs: Bibasilar rales. Cardiovascular: Regular rate and rhythm. Normal S1 and S2. No gallop, murmur, or click. Abdomen: Soft and nontender. Obese. No masses or hepatosplenomegaly. Bowel sounds positive. Extremities: No cyanosis. Trace bilateral edema. Skin: Warm and dry Neuro: Alert and oriented x3. Cranial nerves II through XII are intact. No focal findings.  Labs:   Lab Results  Component Value Date   WBC 9.4 10/03/2013   HGB 12.9 10/03/2013   HCT 39.8 10/03/2013   MCV 77.6* 10/03/2013   PLT 369 10/03/2013    Recent Labs Lab 10/03/13 1208  NA 141  K 3.4*  CL 100  CO2 28  BUN 12  CREATININE 0.57  CALCIUM 8.9  GLUCOSE 224*   Lab Results  Component Value Date   CKTOTAL 82 05/13/2012   CKMB 2.0 05/13/2012   TROPONINI <0.30 10/03/2013    Lab Results  Component Value Date   CHOL 207* 04/06/2013   CHOL 157 07/04/2010   CHOL  Value: 157        ATP III CLASSIFICATION:  <200     mg/dL   Desirable  409-811  mg/dL   Borderline High  >=914    mg/dL   High        78/29/5621   Lab Results  Component Value Date   HDL 58 04/06/2013   HDL 61 12/25/6576   HDL 54 46/96/2952   Lab Results  Component Value Date   LDLCALC  112* 04/06/2013   LDLCALC 61 07/04/2010   LDLCALC  Value: 76        Total Cholesterol/HDL:CHD Risk Coronary Heart Disease Risk Table                     Men   Women  1/2 Average Risk  3.4   3.3  Average Risk       5.0   4.4  2 X Average Risk   9.6   7.1  3 X Average Risk  23.4   11.0        Use the calculated Patient Ratio above and the CHD Risk Table to determine the patient's CHD Risk.        ATP III CLASSIFICATION (LDL):  <100     mg/dL   Optimal  161-096  mg/dL   Near or Above                    Optimal  130-159  mg/dL   Borderline  045-409  mg/dL   High  >811     mg/dL   Very High 91/47/8295   Lab Results  Component Value Date   TRIG 186* 04/06/2013   TRIG 173* 07/04/2010   TRIG 135 10/11/2009   Lab Results  Component Value Date   CHOLHDL 3.6 04/06/2013   CHOLHDL 2.6 Ratio 07/04/2010   CHOLHDL 2.9 10/11/2009   No results found for this basename: LDLDIRECT      Radiology:CHEST 2 VIEW  COMPARISON: 09/02/2013.  FINDINGS:  Trachea is midline. Heart is mildly enlarged, stable. Thoracic aorta  is calcified. There is mixed interstitial and airspace disease with  peripheral septal lines. Small bilateral effusions. Degenerative  changes are seen in the mid thoracic spine.  IMPRESSION:  Congestive heart failure.  Electronically Signed  By: Leanna Battles M.D.  On: 10/03/2013 11:47  EKG: NSR with 1st degree AV block, low voltage, and QTc of 550 msec. This is unchanged from 08/11/13 but QTc is prolonged compared to July and August 2014.  ASSESSMENT AND PLAN:  1. Acute diastolic CHF. Agree with plans for IV diuresis. Continue ACE inhibitor.The patient did have an echocardiogram in July while hospitalized in [place]. Ejection fraction was normal at that time there was no significant valvular abnormality. We will check a TSH. 2. 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 Agree with repleting potassium and magnesium. Patient should be on a much lower dose of amiodarone but apparently  this was not communicated adequately. Agree with holding amiodarone tonight. We'll discuss with Dr. Graciela Husbands but I would anticipate resuming a lower dose of 200 mg daily as previously planned. She has otherwise poor options for rhythm control. 3. Elevated transaminases. This also is probably related to amiodarone. This has been present since July of this year. 4. Hypertension 5. Obstructive sleep apnea with obesity. 6. Hypokalemia. 7. Paroxysmal atrial fibrillation. Now in NSR. Continue Coumadin. I would stop ASA.  SignedTheron Arista [facility] 10/03/2013, 6:01 PM

## 2013-10-03 NOTE — Progress Notes (Signed)
Patient has arrived to unit, patient and vital signs are stable; will continue to monitor patient. D. Yazen Rosko RN 

## 2013-10-03 NOTE — Progress Notes (Signed)
Patient report received, will await for patient to arrive to unit. Lorretta Harp RN

## 2013-10-03 NOTE — H&P (Signed)
[facility] History and Physical  [name] WUJ:[id number] DOB: 19-Aug-1941 DOA: 10/03/2013  Referring physician: Dr. Clarene Duke PCP: Mia Creek, MD   Chief Complaint: SOB at rest  HPI: [name] is a 73 y.o. female  72 year old female with past medical history of paroxysmal atrial fibrillation on chronic Coumadin and amiodarone, also past medical history of diastolic heart failure with an EF of 55%, obstructive sleep apnea that comes in for shortness of breath that started about 2 days prior to admission. She relates she was doing fine but this morning when she woke up she was not able to walk to the bathroom as she was significantly short of breath. She relates the day prior to admission she had to sleep on the recliner in order to sleep comfortable because of her orthopnea. She relates no chest pain palpitations sweating nausea vomiting.  In the ED: A basic metabolic panel was done that showed a potassium of 3.4 blood glucose is 224, proBNP of 369 CBC was done that shows an MCV of 77 with an RDW of 15.7 INR was 2.0 the chest x-ray shows mild pulmonary edema.  Review of Systems:  Constitutional:  No weight loss, night sweats, Fevers, chills, fatigue.  HEENT:  No headaches, Difficulty swallowing,Tooth/dental problems,Sore throat,  No sneezing, itching, ear ache, nasal congestion, post nasal drip,  Cardio-vascular:  swelling in lower extremities, anasarca, dizziness, palpitations  GI:  No heartburn, indigestion, abdominal pain, nausea, vomiting, diarrhea, change in bowel habits, loss of appetite  Resp:  No excess mucus, no productive cough, No non-productive cough, No coughing up of blood.No change in color of mucus.No wheezing.No chest wall deformity  Skin:  no rash or lesions.  GU:  no dysuria, change in color of urine, no urgency or frequency. No flank pain.  Musculoskeletal:  No joint pain or swelling. No decreased range of motion. No back pain.  Psych:  No  change in mood or affect. No depression or anxiety. No memory loss.   Past Medical History  Diagnosis Date  . Atrial fibrillation /flutter   . Hypertension     a.  Renal arterial Dopplers 12/2011: 1-59% right renal artery stenosis  . Diabetes mellitus   . Hyperlipidemia   . Gout   . 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 Diastolic CHF, chronic     a.  echo 2006 - ef 55-65%; mild diast dysfxn;    b. Echo 08/2011: Mild LVH, EF 60%.   . Arthritis   . Morbid obesity   . CHF (congestive heart failure)   . Back pain   . Chronic kidney disease   . Obstructive sleep apnea on CPAP   . Chronic anticoagulation     due to afib   Past Surgical History  Procedure Laterality Date  . Cholecystectomy    . Total abdominal hysterectomy    . Appendectomy    . Tonsillectomy  1982  . Cardioversion  10/22/2011    Procedure: CARDIOVERSION;  Surgeon: Duke Salvia, MD;  Location: [facility] OR;  Service: Cardiovascular;  Laterality: N/A;   Social History:  reports that she has never smoked. She has never used smokeless tobacco. She reports that she does not drink alcohol or use illicit drugs.  No Known Allergies  Family History  Problem Relation Age of Onset  . Heart disease Father   . Hypertension Father   . Breast cancer Sister   . Cancer Sister     breast  Prior to Admission medications   Medication Sig Start Date End Date Taking? Authorizing Provider  allopurinol (ZYLOPRIM) 300 MG tablet Take 300 mg by mouth daily. Take one tablet  daily 07/07/13  Yes Historical Provider, MD  amiodarone (PACERONE) 200 MG tablet Take 1.5 tablets (300 mg total) by mouth 2 (two) times daily. 02/08/13  Yes Duke Salvia, MD  amLODipine (NORVASC) 5 MG tablet Take 5 mg by mouth daily.   Yes Historical Provider, MD  cloNIDine (CATAPRES) 0.2 MG tablet Take 0.2 mg by mouth 2 (two) times daily.   Yes Historical Provider, MD  furosemide (LASIX) 40 MG tablet Take 40 mg by mouth daily.   Yes Historical  Provider, MD  levothyroxine (SYNTHROID, LEVOTHROID) 100 MCG tablet Take 100 mcg by mouth daily before breakfast.   Yes Historical Provider, MD  lisinopril (PRINIVIL,ZESTRIL) 10 MG tablet Take 1 tablet (10 mg total) by mouth daily. 04/07/13  Yes Leroy Sea, MD  metFORMIN (GLUCOPHAGE) 500 MG tablet Take one tablet daily 05/20/13  Yes Historical Provider, MD  potassium chloride SA (K-DUR,KLOR-CON) 20 MEQ tablet Take 2 tablets (40 mEq total) by mouth twice a day. 08/26/13  Yes Lewayne Bunting, MD  warfarin (COUMADIN) 5 MG tablet Take 2.5-5 mg by mouth daily. 5 mg on Saturdays and 2.5 mg all other days.   Yes Historical Provider, MD   Physical Exam: Filed Vitals:   10/03/13 1530  BP: 144/62  Pulse: 58  Temp:   Resp: 18    BP 144/62  Pulse 58  Temp(Src) 97.7 F (36.5 C) (Oral)  Resp 18  Ht 5\' 1"  (1.549 m)  Wt 116.574 kg (257 lb)  BMI 48.58 kg/m2  SpO2 99%  BP 144/62  Pulse 58  Temp(Src) 97.7 F (36.5 C) (Oral)  Resp 18  Ht 5\' 1"  (1.549 m)  Wt 116.574 kg (257 lb)  BMI 48.58 kg/m2  SpO2 99%  General Appearance:    Alert, cooperative, no distress, appears stated age              Throat:   Lips, mucosa, and tongue normal  Neck:   Supple, symmetrical, trachea midline, no adenopathy;    thyroid:  no  JVD     Lungs:     good air movement with crackles bilaterally.       Heart:    Regular rate and rhythm, S1 and S2 normal, no murmur, rub   or gallop     Abdomen:     Soft, non-tender, bowel sounds active all four quadrants,    no masses, no organomegaly        Extremities:   Extremities normal, atraumatic, no cyanosis or edema        Lymph nodes:   Cervical, supraclavicular, and axillary nodes normal  Neurologic:   CNII-XII intact, normal strength, sensation and reflexes    throughout             Labs on Admission:  Basic Metabolic Panel:  Recent Labs Lab 10/03/13 1208  NA 141  K 3.4*  CL 100  CO2 28  GLUCOSE 224*  BUN 12  CREATININE 0.57  CALCIUM 8.9    Liver Function Tests: No results found for this basename: AST, ALT, ALKPHOS, BILITOT, PROT, ALBUMIN,  in the last 168 hours No results found for this basename: LIPASE, AMYLASE,  in the last 168 hours No results found for this basename: AMMONIA,  in the last 168 hours CBC:  Recent Labs Lab 10/03/13 1208  WBC 9.4  NEUTROABS 6.8  HGB 12.9  HCT 39.8  MCV 77.6*  PLT 369   Cardiac Enzymes:  Recent Labs Lab 10/03/13 1208  TROPONINI <0.30    BNP (last 3 results)  Recent Labs  04/05/13 1355 04/21/13 1518 10/03/13 1208  PROBNP 188.6* 308.3* 396.9*   CBG: No results found for this basename: GLUCAP,  in the last 168 hours  Radiological Exams on Admission: Dg Chest 2 View  10/03/2013   CLINICAL DATA:  Shortness of breath and weakness.  EXAM: CHEST  2 VIEW  COMPARISON:  09/02/2013.  FINDINGS: Trachea is midline. Heart is mildly enlarged, stable. Thoracic aorta is calcified. There is mixed interstitial and airspace disease with peripheral septal lines. Small bilateral effusions. Degenerative changes are seen in the mid thoracic spine.  IMPRESSION: Congestive heart failure.   Electronically Signed   By: Leanna Battles M.D.   On: 10/03/2013 11:47    EKG: Independently reviewed. First degree A-V block prolonged QT, nonspecific T wave changes.  Assessment/Plan  Acute diastolic congestive heart failure, NYHA class 3: - She is not in significant distress, so I will go ahead and repeat her potassium before starting diuresis as she has a QT of 550 and hypokalemia. - Will admit her to a telemetry unit start her on IV Lasix once we have gotten one dose of lasix on board, monitor her electrolytes closely. - Strict I.'s nose, daily weights. - Low sodium diet.  Prolonged QT interval/  Hypokalemia: - I will give her magnesium IV as she has multiple risk factors for prolonged QT including hypokalemia and amiodarone. Replete her potassium orally.  - Check an EKG and  magnesium in the  morning.  OBSTRUCTIVE SLEEP APNEA: - CPAP prime settings.  HYPERTENSION: - Blood pressure currently well controlled, currently she benefits from Norvasc we'll go ahead and stop that one and continue her Lasix, clonidine and ACE inhibitor. She will probably benefit from a beta blocker to her heart failure.  ATRIAL FIBRILLATION, PAROXYSMAL - hold amiodarone, her LFT's were elevated. - Continue Coumadin per pharmacy. Consult cardiology for evaluation of her dose amiodarone and see if it is affecting her QT and her LFTs.    Code Status: full Family Communication: daughter, husband Disposition Plan: inpatient  Time spent: 55 minutes  Marinda Elk [facility] Pager [phone]

## 2013-10-03 NOTE — ED Notes (Signed)
Ambulating on pulse oximetry pt sats @ 98% on room air sitting in room. Sats dropped to 91% wile ambulating. C/O with some Shortest of breath. Dr informed of same.

## 2013-10-03 NOTE — ED Provider Notes (Signed)
CSN: [id number]     Arrival date & time 10/03/13  4098 History   First MD Initiated Contact with Patient 10/03/13 [phone]     Chief Complaint  Patient presents with  . Shortness of Breath   HPI Pt was seen at 1005. Per pt, c/o gradual onset and persistence of constant SOB since last night. Pt states the SOB began when she walked to the bathroom last night. Pt states she woke up this morning approx 0500 PTA with continued SOB. Endorses hx of HTN and CHF, has not taken her meds. Denies any increase in her usual pedal edema. Denies CP/palpitations, no cough, no fevers, no rash, no abd pain, no N/V/D.    Past Medical History  Diagnosis Date  . Atrial fibrillation /flutter   . Hypertension     a.  Renal arterial Dopplers 12/2011: 1-59% right renal artery stenosis  . Diabetes mellitus   . Hyperlipidemia   . Gout   . 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 Diastolic CHF, chronic     a.  echo 2006 - ef 55-65%; mild diast dysfxn;    b. Echo 08/2011: Mild LVH, EF 60%.   . Arthritis   . Morbid obesity   . CHF (congestive heart failure)   . Back pain   . Chronic kidney disease   . Obstructive sleep apnea on CPAP   . Chronic anticoagulation     due to afib   Past Surgical History  Procedure Laterality Date  . Cholecystectomy    . Total abdominal hysterectomy    . Appendectomy    . Tonsillectomy  1982  . Cardioversion  10/22/2011    Procedure: CARDIOVERSION;  Surgeon: Duke Salvia, MD;  Location: [facility] OR;  Service: Cardiovascular;  Laterality: N/A;   Family History  Problem Relation Age of Onset  . Heart disease Father   . Hypertension Father   . Breast cancer Sister   . Cancer Sister     breast   History  Substance Use Topics  . Smoking status: Never Smoker   . Smokeless tobacco: Never Used  . Alcohol Use: No    Review of Systems ROS: Statement: All systems negative except as marked or noted in the HPI; Constitutional: Negative for fever and chills. ; ;  Eyes: Negative for eye pain, redness and discharge. ; ; ENMT: Negative for ear pain, hoarseness, nasal congestion, sinus pressure and sore throat. ; ; Cardiovascular: Negative for chest pain, palpitations, diaphoresis, +dyspnea and peripheral edema. ; ; Respiratory: Negative for cough, wheezing and stridor. ; ; Gastrointestinal: Negative for nausea, vomiting, diarrhea, abdominal pain, blood in stool, hematemesis, jaundice and rectal bleeding. . ; ; Genitourinary: Negative for dysuria, flank pain and hematuria. ; ; Musculoskeletal: Negative for back pain and neck pain. Negative for swelling and trauma.; ; Skin: Negative for pruritus, rash, abrasions, blisters, bruising and skin lesion.; ; Neuro: Negative for headache, lightheadedness and neck stiffness. Negative for weakness, altered level of consciousness , altered mental status, extremity weakness, paresthesias, involuntary movement, seizure and syncope.      Allergies  Review of patient's allergies indicates no known allergies.  Home Medications   Current Outpatient Rx  Name  Route  Sig  Dispense  Refill  . allopurinol (ZYLOPRIM) 300 MG tablet   Oral   Take 300 mg by mouth daily. Take one tablet  daily         . amiodarone (PACERONE) 200 MG tablet  Oral   Take 1.5 tablets (300 mg total) by mouth 2 (two) times daily.   90 tablet   3   . amLODipine (NORVASC) 5 MG tablet   Oral   Take 5 mg by mouth daily.         . cloNIDine (CATAPRES) 0.2 MG tablet   Oral   Take 0.2 mg by mouth 2 (two) times daily.         . furosemide (LASIX) 40 MG tablet   Oral   Take 40 mg by mouth daily.         Marland Kitchen levothyroxine (SYNTHROID, LEVOTHROID) 100 MCG tablet   Oral   Take 100 mcg by mouth daily before breakfast.         . lisinopril (PRINIVIL,ZESTRIL) 10 MG tablet   Oral   Take 1 tablet (10 mg total) by mouth daily.   30 tablet   0   . metFORMIN (GLUCOPHAGE) 500 MG tablet      Take one tablet daily         . potassium chloride  SA (K-DUR,KLOR-CON) 20 MEQ tablet      Take 2 tablets (40 mEq total) by mouth twice a day.   120 tablet   3   . warfarin (COUMADIN) 5 MG tablet   Oral   Take 2.5-5 mg by mouth daily. 5 mg on Saturdays and 2.5 mg all other days.          BP 155/75  Pulse 63  Temp(Src) 97.7 F (36.5 C) (Oral)  Resp 18  Ht 5\' 1"  (1.549 m)  Wt 257 lb (116.574 kg)  BMI 48.58 kg/m2  SpO2 96% Physical Exam 1010: Physical examination:  Nursing notes reviewed; Vital signs and O2 SAT reviewed;  Constitutional: Well developed, Well nourished, Well hydrated, In no acute distress; Head:  Normocephalic, atraumatic; Eyes: EOMI, PERRL, No scleral icterus; ENMT: Mouth and pharynx normal, Mucous membranes moist; Neck: Supple, Full range of motion, No lymphadenopathy; Cardiovascular: Regular rate and rhythm, No gallop; Respiratory: Breath sounds coarse & equal bilaterally, No wheezes.  Speaking full sentences with ease, Normal respiratory effort/excursion; Chest: Nontender, Movement normal; Abdomen: Soft, Nontender, Nondistended, Normal bowel sounds; Genitourinary: No CVA tenderness; Extremities: Pulses normal, No tenderness, +1 bilat pedal edema, No calf asymmetry.; Neuro: AA&Ox3, Major CN grossly intact.  Speech clear. No gross focal motor or sensory deficits in extremities.; Skin: Color normal, Warm, Dry.    ED Course  Procedures  EKG Interpretation   None       MDM  MDM Reviewed: previous chart, nursing note and vitals Reviewed previous: labs and ECG Interpretation: labs, ECG and x-ray     Results for orders placed during the hospital encounter of 10/03/13  URINALYSIS W MICROSCOPIC + REFLEX CULTURE      Result Value Range   Color, Urine YELLOW  YELLOW   APPearance CLOUDY (*) CLEAR   Specific Gravity, Urine 1.008  1.005 - 1.030   pH 8.0  5.0 - 8.0   Glucose, UA 250 (*) NEGATIVE mg/dL   Hgb urine dipstick NEGATIVE  NEGATIVE   Bilirubin Urine NEGATIVE  NEGATIVE   Ketones, ur NEGATIVE  NEGATIVE  mg/dL   Protein, ur 30 (*) NEGATIVE mg/dL   Urobilinogen, UA 0.2  0.0 - 1.0 mg/dL   Nitrite NEGATIVE  NEGATIVE   Leukocytes, UA NEGATIVE  NEGATIVE   WBC, UA 0-2  <3 WBC/hpf   Bacteria, UA FEW (*) RARE   Squamous Epithelial / LPF RARE  RARE  Urine-Other AMORPHOUS URATES/PHOSPHATES    BASIC METABOLIC PANEL      Result Value Range   Sodium 141  135 - 145 mEq/L   Potassium 3.4 (*) 3.5 - 5.1 mEq/L   Chloride 100  96 - 112 mEq/L   CO2 28  19 - 32 mEq/L   Glucose, Bld 224 (*) 70 - 99 mg/dL   BUN 12  6 - 23 mg/dL   Creatinine, Ser 1.61  0.50 - 1.10 mg/dL   Calcium 8.9  8.4 - 09.6 mg/dL   GFR calc non Af Amer >90  >90 mL/min   GFR calc Af Amer >90  >90 mL/min  CBC WITH DIFFERENTIAL      Result Value Range   WBC 9.4  4.0 - 10.5 K/uL   RBC 5.13 (*) 3.87 - 5.11 MIL/uL   Hemoglobin 12.9  12.0 - 15.0 g/dL   HCT 04.5  40.9 - 81.1 %   MCV 77.6 (*) 78.0 - 100.0 fL   MCH 25.1 (*) 26.0 - 34.0 pg   MCHC 32.4  30.0 - 36.0 g/dL   RDW 91.4 (*) 78.2 - 95.6 %   Platelets 369  150 - 400 K/uL   Neutrophils Relative % 72  43 - 77 %   Neutro Abs 6.8  1.7 - 7.7 K/uL   Lymphocytes Relative 15  12 - 46 %   Lymphs Abs 1.4  0.7 - 4.0 K/uL   Monocytes Relative 12  3 - 12 %   Monocytes Absolute 1.1 (*) 0.1 - 1.0 K/uL   Eosinophils Relative 2  0 - 5 %   Eosinophils Absolute 0.1  0.0 - 0.7 K/uL   Basophils Relative 0  0 - 1 %   Basophils Absolute 0.0  0.0 - 0.1 K/uL  PROTIME-INR      Result Value Range   Prothrombin Time 22.1 (*) 11.6 - 15.2 seconds   INR 2.00 (*) 0.00 - 1.49  TROPONIN I      Result Value Range   Troponin I <0.30  <0.30 ng/mL  PRO B NATRIURETIC PEPTIDE      Result Value Range   Pro B Natriuretic peptide (BNP) 396.9 (*) 0 - 125 pg/mL   Dg Chest 2 View 10/03/2013   CLINICAL DATA:  Shortness of breath and weakness.  EXAM: CHEST  2 VIEW  COMPARISON:  09/02/2013.  FINDINGS: Trachea is midline. Heart is mildly enlarged, stable. Thoracic aorta is calcified. There is mixed interstitial and  airspace disease with peripheral septal lines. Small bilateral effusions. Degenerative changes are seen in the mid thoracic spine.  IMPRESSION: Congestive heart failure.   Electronically Signed   By: Leanna Battles M.D.   On: 10/03/2013 11:47    1315:  Pt with CHF on CXR; will dose IV lasix. Pt ambulated with Sats dropping to 91% R/A with pt c/o increasing SOB and RR. Dx and testing d/w pt.  Questions answered.  Verb understanding, agreeable to admit.  T/C to [facility] Resident, case discussed, including:  HPI, pertinent PM/SHx, VS/PE, dx testing, ED course and treatment:  Agreeable to come to ED for eval.   1625:  [facility] resident now states they are capped. T/C to [facility]. David Stall, case discussed, including:  HPI, pertinent PM/SHx, VS/PE, dx testing, ED course and treatment:  Agreeable to admit, requests to write temporary orders, obtain inpatient tele bed to team 10.   Laray Anger, DO 10/05/13 [phone]

## 2013-10-03 NOTE — Progress Notes (Signed)
Patient report attempted, will try again. Lorretta Harp RN

## 2013-10-03 NOTE — ED Notes (Signed)
Pt arrived by [facility] from home with c/o SOB. Pt was walking around this morning around 0500 and became SOB. Dx with CHF in July. Lower lobe rales.  Pt has not taken BP medication or Lasix.  BP-191/94 HR-68 92%ra applied 02 2lpm now 97%. Pt does not wear O2 at home.

## 2013-10-04 ENCOUNTER — Other Ambulatory Visit: Payer: Medicare Other

## 2013-10-04 DIAGNOSIS — I4891 Unspecified atrial fibrillation: Secondary | ICD-10-CM

## 2013-10-04 DIAGNOSIS — I5031 Acute diastolic (congestive) heart failure: Secondary | ICD-10-CM

## 2013-10-04 LAB — BASIC METABOLIC PANEL
BUN: 11 mg/dL (ref 6–23)
BUN: 16 mg/dL (ref 6–23)
CO2: 29 mEq/L (ref 19–32)
Calcium: 8.4 mg/dL (ref 8.4–10.5)
Chloride: 99 mEq/L (ref 96–112)
Creatinine, Ser: 0.89 mg/dL (ref 0.50–1.10)
GFR calc Af Amer: 75 mL/min — ABNORMAL LOW (ref >90)
GFR calc non Af Amer: 87 mL/min — ABNORMAL LOW (ref >90)
GFR calc non Af Amer: 65 mL/min — ABNORMAL LOW (ref >90)
Glucose, Bld: 112 mg/dL — ABNORMAL HIGH (ref 70–99)
Potassium: 2.8 mEq/L — ABNORMAL LOW (ref 3.5–5.1)
Sodium: 138 mEq/L (ref 135–145)
Sodium: 137 mEq/L (ref 135–145)

## 2013-10-04 LAB — HEMOGLOBIN A1C
Hgb A1c MFr Bld: 9.5 % — ABNORMAL HIGH (ref <5.7)
Mean Plasma Glucose: 226 mg/dL — ABNORMAL HIGH (ref <117)

## 2013-10-04 LAB — GLUCOSE, CAPILLARY
Glucose-Capillary: 173 mg/dL — ABNORMAL HIGH (ref 70–99)
Glucose-Capillary: 115 mg/dL — ABNORMAL HIGH (ref 70–99)
Glucose-Capillary: 152 mg/dL — ABNORMAL HIGH (ref 70–99)
Glucose-Capillary: 101 mg/dL — ABNORMAL HIGH (ref 70–99)

## 2013-10-04 LAB — TSH: TSH: 2.594 u[IU]/mL (ref 0.350–4.500)

## 2013-10-04 LAB — T4, FREE: Free T4: 2.01 ng/dL — ABNORMAL HIGH (ref 0.80–1.80)

## 2013-10-04 LAB — MAGNESIUM: Magnesium: 2.4 mg/dL (ref 1.5–2.5)

## 2013-10-04 MED ORDER — POTASSIUM CHLORIDE CRYS ER 20 MEQ PO TBCR
60.0000 meq | EXTENDED_RELEASE_TABLET | Freq: Two times a day (BID) | ORAL | Status: DC
  Administered 2013-10-04: 60 meq via ORAL

## 2013-10-04 MED ORDER — SPIRONOLACTONE 12.5 MG HALF TABLET
12.5000 mg | ORAL_TABLET | Freq: Every day | ORAL | Status: DC
  Administered 2013-10-04 – 2013-10-05 (×2): 12.5 mg via ORAL
  Filled 2013-10-04 (×2): qty 1

## 2013-10-04 MED ORDER — WARFARIN SODIUM 5 MG PO TABS
5.0000 mg | ORAL_TABLET | Freq: Once | ORAL | Status: AC
  Administered 2013-10-04: 5 mg via ORAL
  Filled 2013-10-04: qty 1

## 2013-10-04 MED ORDER — POTASSIUM CHLORIDE CRYS ER 20 MEQ PO TBCR
60.0000 meq | EXTENDED_RELEASE_TABLET | Freq: Three times a day (TID) | ORAL | Status: DC
  Administered 2013-10-04 (×2): 60 meq via ORAL
  Filled 2013-10-04 (×5): qty 3

## 2013-10-04 MED ORDER — PANTOPRAZOLE SODIUM 40 MG PO TBEC
40.0000 mg | DELAYED_RELEASE_TABLET | Freq: Every day | ORAL | Status: DC
  Administered 2013-10-04 – 2013-10-05 (×2): 40 mg via ORAL
  Filled 2013-10-04 (×2): qty 1

## 2013-10-04 MED ORDER — INSULIN GLARGINE 100 UNIT/ML ~~LOC~~ SOLN
30.0000 [IU] | Freq: Every day | SUBCUTANEOUS | Status: DC
  Administered 2013-10-04: 30 [IU] via SUBCUTANEOUS
  Filled 2013-10-04 (×2): qty 0.3

## 2013-10-04 MED ORDER — POTASSIUM CHLORIDE CRYS ER 20 MEQ PO TBCR: 40.0000 meq | EXTENDED_RELEASE_TABLET | Freq: Two times a day (BID) | ORAL | Status: DC

## 2013-10-04 MED ORDER — POTASSIUM CHLORIDE 10 MEQ/100ML IV SOLN
10.0000 meq | INTRAVENOUS | Status: AC
  Administered 2013-10-04 (×5): 10 meq via INTRAVENOUS
  Filled 2013-10-04 (×5): qty 100

## 2013-10-04 NOTE — Progress Notes (Signed)
Pt. Receiving IV diuretics. Pt. Stated she had been to the restroom 3 times and produced urine. Amount of output not available. Pt. Flushed urine. RN informed pt. Of importance of staff measuring her output. Pt. Communicated understanding. RN will continue to monitor pt. Lamiya Naas, Cheryll Dessert

## 2013-10-04 NOTE — Progress Notes (Signed)
CMT reports that patient had vtach, MD text paged; patient is asymptomatic and sitting on the side of the bed eating breakfast; will continue to monitor patient. Lorretta Harp RN

## 2013-10-04 NOTE — Progress Notes (Signed)
[facility] PROGRESS NOTE  Assessment/Plan: Acute diastolic congestive heart failure, NYHA class 3: - Continue IV Lasix, strict I.'s O's, fluid restrict and a low-sodium diet. - Continue her monitor her electrolytes and replete aggressively. - start low-dose spironolactone, Continue ACE inhibitor. - currently not on a beta blocker due to her low heart rate.  Prolonged QT interval: - held her amiodarone, replete potassium and magnesium with improvement of her QTC. Potassium did not improved,  Most likely due to lasix IV. - Appreciate Dr. Graciela Husbands and Dr. Elvis Coil assistance.   Hypokalemia: - replete potassium IV and by mouth, will try higher doses.Maryclare Labrador start her on low-dose spironolactone. - She seems to be a [place] heart class Association 3 he would also help with her potassium depletion.  Transaminitis: - This probably most likely to amiodarone. - Have held it was discussed with Dr. Philomena Doheny to restart.    HYPERTENSION: - Blood pressure continues to improve continue diuresis with Lasix, continue ACE inhibitor. - Low dose spironolactone.   ATRIAL FIBRILLATION, PAROXYSMAL: - rate controlled INR therapeutic.  OBSTRUCTIVE SLEEP APNEA - c-pap.   Code Status: full  Family Communication: daughter, husband  Disposition Plan: inpatient   Consultants:  cardiology  Procedures:  none  Antibiotics:  None  HPI/Subjective: She relates she feels better, able to lay flat to sleep and SOB improved.  Objective: Filed Vitals:   10/03/13 2051 10/03/13 2147 10/04/13 0604 10/04/13 0920  BP: 133/61 128/50 134/47 125/35  Pulse: 55 56 57 57  Temp:  97.8 F (36.6 C) 98.2 F (36.8 C) 98.1 F (36.7 C)  TempSrc:  Oral Oral Oral  Resp:  18 18 18   Height:      Weight:   112.4 kg (247 lb 12.8 oz)   SpO2:  98% 98% 98%    Intake/Output Summary (Last 24 hours) at 10/04/13 1032 Last data filed at 10/04/13 0925  Gross per 24 hour  Intake 1718.2 ml  Output   2526 ml  Net  -807.8 ml   Filed Weights   10/03/13 1001 10/03/13 1751 10/04/13 0604  Weight: 116.574 kg (257 lb) 110.1 kg (242 lb 11.6 oz) 112.4 kg (247 lb 12.8 oz)    Exam:  General: Alert, awake, oriented x3, in no acute distress.  HEENT: No bruits, no goiter. - JVD Heart: Regular rate and rhythm, without murmurs, rubs, gallops.  Lungs: Good air movement, crackles at bases Abdomen: Soft, nontender, nondistended, positive bowel sounds.   Data Reviewed: Basic Metabolic Panel:  Recent Labs Lab 10/03/13 1208 10/03/13 1905 10/04/13 0610  NA 141 141 138  K 3.4* 2.9* 2.8*  CL 100 100 98  CO2 28 30 26   GLUCOSE 224* 213* 175*  BUN 12 11 11   CREATININE 0.57 0.67 0.69  CALCIUM 8.9 8.5 8.4  MG  --  1.7 2.4   Liver Function Tests:  Recent Labs Lab 10/03/13 1905  AST 94*  ALT 87*  ALKPHOS 153*  BILITOT 0.5  PROT 7.0  ALBUMIN 3.1*   No results found for this basename: LIPASE, AMYLASE,  in the last 168 hours No results found for this basename: AMMONIA,  in the last 168 hours CBC:  Recent Labs Lab 10/03/13 1208 10/03/13 1905  WBC 9.4 10.1  NEUTROABS 6.8  --   HGB 12.9 12.1  HCT 39.8 37.5  MCV 77.6* 78.9  PLT 369 366   Cardiac Enzymes:  Recent Labs Lab 10/03/13 1208  TROPONINI <0.30   BNP (last 3 results)  Recent Labs  04/05/13 1355 04/21/13 1518 10/03/13 1208  PROBNP 188.6* 308.3* 396.9*   CBG:  Recent Labs Lab 10/03/13 1804 10/03/13 2149 10/04/13 0736  GLUCAP 161* 204* 173*    No results found for this or any previous visit (from the past 240 hour(s)).   Studies: Dg Chest 2 View  10/03/2013   CLINICAL DATA:  Shortness of breath and weakness.  EXAM: CHEST  2 VIEW  COMPARISON:  09/02/2013.  FINDINGS: Trachea is midline. Heart is mildly enlarged, stable. Thoracic aorta is calcified. There is mixed interstitial and airspace disease with peripheral septal lines. Small bilateral effusions. Degenerative changes are seen in the mid thoracic spine.  IMPRESSION:  Congestive heart failure.   Electronically Signed   By: Leanna Battles M.D.   On: 10/03/2013 11:47    Scheduled Meds: . allopurinol  300 mg Oral Daily  . cloNIDine  0.2 mg Oral BID  . furosemide  40 mg Intravenous Q12H  . insulin aspart  0-15 Units Subcutaneous TID WC  . insulin aspart  0-5 Units Subcutaneous QHS  . insulin aspart  6 Units Subcutaneous TID WC  . insulin glargine  30 Units Subcutaneous QHS  . levothyroxine  100 mcg Oral QAC breakfast  . lisinopril  10 mg Oral Daily  . potassium chloride  60 mEq Oral BID  . sodium chloride  3 mL Intravenous Q12H  . Warfarin - Pharmacist Dosing Inpatient   Does not apply q1800   Continuous Infusions:    Marinda Elk  [facility] Pager [phone].  If 8PM-8AM, please contact night-coverage at [email or web address], password [facility] 10/04/2013, 10:32 AM  LOS: 1 day

## 2013-10-04 NOTE — Progress Notes (Signed)
Pt has her own CPAP machine and states she has everything she needs to place herself on later in the night. RT will continue to monitor.

## 2013-10-04 NOTE — Evaluation (Signed)
Physical Therapy Evaluation Patient Details Name: [name] MRN: [id number] DOB: 1941-09-16 Today's Date: 10/04/2013 Time: 4098-1191 PT Time Calculation (min): 16 min  PT Assessment / Plan / Recommendation History of Present Illness  Pt admit for CHF.  Clinical Impression  Pt admitted with above. Pt currently with functional limitations due to the deficits listed below (see PT Problem List).  Pt will benefit from skilled PT to increase their independence and safety with mobility to allow discharge to the venue listed below.     PT Assessment  Patient needs continued PT services    Follow Up Recommendations  Home health PT;Supervision/Assistance - 24 hour                Equipment Recommendations  None recommended by PT         Frequency Min 3X/week    Precautions / Restrictions Precautions Precautions: Fall Restrictions Weight Bearing Restrictions: No   Pertinent Vitals/Pain O2 on 2L >90%.  Desat to less than 88% without O2.  No pain      Mobility  Bed Mobility Bed Mobility: Rolling Left;Left Sidelying to Sit;Sitting - Scoot to Edge of Bed Rolling Left: 4: Min guard Left Sidelying to Sit: 4: Min assist Sitting - Scoot to Edge of Bed: 4: Min guard Details for Bed Mobility Assistance: incr time and pt uses momentum. Transfers Transfers: Sit to Stand;Stand to Sit Sit to Stand: 4: Min assist;With upper extremity assist;From bed Stand to Sit: 4: Min assist;With upper extremity assist;To bed Details for Transfer Assistance: cues for hand placement. Ambulation/Gait Ambulation/Gait Assistance: 4: Min guard Ambulation Distance (Feet): 25 Feet Assistive device: Rolling walker Ambulation/Gait Assistance Details: Pt ambulates safely with RW.  Unsteady without RW.  Has RW at home.  Needed O2 at 2L to keep sats >90%.   Gait Pattern: Decreased stride length;Wide base of support;Trunk flexed Gait velocity: decreased Stairs: No Wheelchair Mobility Wheelchair Mobility:  No         PT Diagnosis: Generalized weakness  PT Problem List: Decreased activity tolerance;Decreased balance;Decreased mobility;Decreased knowledge of use of DME;Decreased safety awareness;Decreased knowledge of precautions PT Treatment Interventions: DME instruction;Gait training;Functional mobility training;Therapeutic activities;Therapeutic exercise;Balance training;Patient/family education     PT Goals(Current goals can be found in the care plan section) Acute Rehab PT Goals Patient Stated Goal: to go home PT Goal Formulation: With patient Time For Goal Achievement: 10/11/13 Potential to Achieve Goals: Good  Visit Information  Last PT Received On: 10/04/13 Assistance Needed: +1 History of Present Illness: Pt admit for CHF.       Prior Functioning  Home Living Family/patient expects to be discharged to:: Private residence Living Arrangements: Spouse/significant other Available Help at Discharge: Family;Available 24 hours/day Type of Home: Apartment Home Access: Stairs to enter Entrance Stairs-Number of Steps: 2 Entrance Stairs-Rails: None Home Layout: One level Home Equipment: Walker - 2 wheels;Bedside commode Prior Function Level of Independence: Independent Communication Communication: No difficulties    Cognition  Cognition Arousal/Alertness: Awake/alert Behavior During Therapy: WFL for tasks assessed/performed Overall Cognitive Status: Within Functional Limits for tasks assessed    Extremity/Trunk Assessment Upper Extremity Assessment Upper Extremity Assessment: Defer to OT evaluation Lower Extremity Assessment Lower Extremity Assessment: Generalized weakness   Balance    End of Session PT - End of Session Equipment Utilized During Treatment: Gait belt;Oxygen Activity Tolerance: Patient limited by fatigue Patient left: in bed;with call bell/phone within reach Nurse Communication: Mobility status       INGOLD,Jarrod Mcenery 10/04/2013, 4:13 PM Tayley Mudrick  [place],PT Acute Rehabilitation  [phone] (pager)

## 2013-10-04 NOTE — Progress Notes (Signed)
ANTICOAGULATION CONSULT NOTE - Follow Up Consult  Pharmacy Consult for coumadin Indication: atrial fibrillation  No Known Allergies  Patient Measurements: Height: 5\' 1"  (154.9 cm) Weight: 247 lb 12.8 oz (112.4 kg) IBW/kg (Calculated) : 47.8  Vital Signs: Temp: 98.1 F (36.7 C) (12/16 0920) Temp src: Oral (12/16 0920) BP: 125/35 mmHg (12/16 0920) Pulse Rate: 57 (12/16 0920)  Labs:  Recent Labs  10/03/13 1208 10/03/13 1905 10/04/13 0610  HGB 12.9 12.1  --   HCT 39.8 37.5  --   PLT 369 366  --   LABPROT 22.1*  --  23.2*  INR 2.00*  --  2.14*  CREATININE 0.57 0.67 0.69  TROPONINI <0.30  --   --     Estimated Creatinine Clearance: 77.1 ml/min (by C-G formula based on Cr of 0.69).   Medications:  Scheduled:  . allopurinol  300 mg Oral Daily  . cloNIDine  0.2 mg Oral BID  . furosemide  40 mg Intravenous Q12H  . insulin aspart  0-15 Units Subcutaneous TID WC  . insulin aspart  0-5 Units Subcutaneous QHS  . insulin aspart  6 Units Subcutaneous TID WC  . insulin glargine  30 Units Subcutaneous QHS  . levothyroxine  100 mcg Oral QAC breakfast  . lisinopril  10 mg Oral Daily  . potassium chloride  60 mEq Oral BID  . sodium chloride  3 mL Intravenous Q12H  . Warfarin - Pharmacist Dosing Inpatient   Does not apply q1800    Assessment: AC is a 72 yo F admitted on 12/15 with SOB and a prolonged QTc interval on amiodarone. (Per notes, patient was to decrease amiodarone dose to 200 mg daily but misunderstood and continued to take 300 mg BID, and she is now presenting with signs/symptoms suggestive of amiodarone toxicity.) Patient has a history of atrial fibrillation for which she takes coumadin. Her home dose is 5 mg on Saturday, and 2.5 mg on all other days. Pharmacy has been consulted to dose her coumadin.  Upon admission, her INR was 2.0. Patient's INR today is therapeutic at 2.14 after receiving a slightly higher dose than her home dose yesterday. Due to only a slight  increase in INR after a higher dose was given, dosing will be slightly more aggressive than her home regimen today.  Hgb is 12.1 and no bleeding issues have been noted. Yesterday's dose has been charted as given.   Goal of Therapy:  INR 2-3 Monitor platelets by anticoagulation protocol: Yes   Plan:  -Give coumadin 5 mg PO x 1 tonight -Daily INR -Monitor signs/symptoms of bleeding  Enedina Pair C. Tsion Inghram, PharmD Clinical Pharmacist-Resident Pager: [phone] Pharmacy: [phone] 10/04/2013 10:29 AM

## 2013-10-04 NOTE — Progress Notes (Signed)
Patient Name: [name]      SUBJECTIVE: admitted with sob On amio for afib for HFpEF  ? amio toxicity Mild LFT abnormalities concurrent with Amio although also noted slightly yup 5/13  QT Issue not so important with amio but hypokalemia is big deal  Past Medical History  Diagnosis Date  . Atrial fibrillation /flutter   . Hypertension     a.  Renal arterial Dopplers 12/2011: 1-59% right renal artery stenosis  . Diabetes mellitus   . Hyperlipidemia   . Gout   . 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 Diastolic CHF, chronic     a.  echo 2006 - ef 55-65%; mild diast dysfxn;    b. Echo 08/2011: Mild LVH, EF 60%.   . Arthritis   . Morbid obesity   . CHF (congestive heart failure)   . Back pain   . Chronic kidney disease   . Obstructive sleep apnea on CPAP   . Chronic anticoagulation     due to afib    Scheduled Meds:  Scheduled Meds: . allopurinol  300 mg Oral Daily  . cloNIDine  0.2 mg Oral BID  . furosemide  40 mg Intravenous Q12H  . influenza vac split quadrivalent PF  0.5 mL Intramuscular Tomorrow-1000  . insulin aspart  0-15 Units Subcutaneous TID WC  . insulin aspart  0-5 Units Subcutaneous QHS  . insulin aspart  6 Units Subcutaneous TID WC  . insulin glargine  30 Units Subcutaneous QHS  . levothyroxine  100 mcg Oral QAC breakfast  . lisinopril  10 mg Oral Daily  . potassium chloride  60 mEq Oral BID  . sodium chloride  3 mL Intravenous Q12H  . Warfarin - Pharmacist Dosing Inpatient   Does not apply q1800   Continuous Infusions:   PHYSICAL EXAM Filed Vitals:   10/03/13 1751 10/03/13 2051 10/03/13 2147 10/04/13 0604  BP: 165/60 133/61 128/50 134/47  Pulse: 60 55 56 57  Temp: 97.6 F (36.4 C)  97.8 F (36.6 C) 98.2 F (36.8 C)  TempSrc: Oral  Oral Oral  Resp: 19  18 18   Height: 5\' 1"  (1.549 m)     Weight: 242 lb 11.6 oz (110.1 kg)   247 lb 12.8 oz (112.4 kg)  SpO2: 95%  98% 98%    Well developed and nourished  in no acute distress HENT normal Neck supple with JVP-flat Clear Regular rate and rhythm, no murmurs or gallops Abd-soft with active BS No Clubbing cyanosis edema Skin-warm and dry A & Oriented  Grossly normal sensory and motor function   TELEMETRY: Reviewed telemetry pt in NSR:    Intake/Output Summary (Last 24 hours) at 10/04/13 0818 Last data filed at 10/04/13 0700  Gross per 24 hour  Intake 1115.2 ml  Output   2325 ml  Net -1209.8 ml    LABS: Basic Metabolic Panel:  Recent Labs Lab 10/03/13 1208 10/03/13 1905  NA 141 141  K 3.4* 2.9*  CL 100 100  CO2 28 30  GLUCOSE 224* 213*  BUN 12 11  CREATININE 0.57 0.67  CALCIUM 8.9 8.5  MG  --  1.7   Cardiac Enzymes:  Recent Labs  10/03/13 1208  TROPONINI <0.30   CBC:  Recent Labs Lab 10/03/13 1208 10/03/13 1905  WBC 9.4 10.1  NEUTROABS 6.8  --   HGB 12.9 12.1  HCT 39.8 37.5  MCV 77.6* 78.9  PLT 369  366   PROTIME:  Recent Labs  10/03/13 1208 10/04/13 0610  LABPROT 22.1* 23.2*  INR 2.00* 2.14*   Liver Function Tests:  Recent Labs  10/03/13 1905  AST 94*  ALT 87*  ALKPHOS 153*  BILITOT 0.5  PROT 7.0  ALBUMIN 3.1*   No results found for this basename: LIPASE, AMYLASE,  in the last 72 hours BNP: BNP (last 3 results)  Recent Labs  04/05/13 1355 04/21/13 1518 10/03/13 1208  PROBNP 188.6* 308.3* 396.9*   D-Dimer: No results found for this basename: DDIMER,  in the last 72 hours Hemoglobin A1C:  Recent Labs  10/03/13 1905  HGBA1C 9.5*     ASSESSMENT AND PLAN:  Principal Problem:   Acute diastolic congestive heart failure, NYHA class 3 Active Problems:   ATRIAL FIBRILLATION, PAROXYSMAL   OBSTRUCTIVE SLEEP APNEA   HYPERTENSION   Hypokalemia   Prolonged QT interval   Acute diastolic heart failure  Resume amio at 200 qd Follow LFTs Replete K  Signed, Sherryl Manges MD  10/04/2013

## 2013-10-04 NOTE — Progress Notes (Signed)
Inpatient Diabetes Program Recommendations  AACE/ADA: New Consensus Statement on Inpatient Glycemic Control (2013)  Target Ranges:  Prepandial:   less than 140 mg/dL      Peak postprandial:   less than 180 mg/dL (1-2 hours)      Critically ill patients:  140 - 180 mg/dL   Reason for Visit: Results for AMAN, BATLEY (MRN [id number]) as of 10/04/2013 15:11  Ref. Range 10/03/2013 18:04 10/03/2013 21:49 10/04/2013 07:36 10/04/2013 10:58  Glucose-Capillary Latest Range: 70-99 mg/dL 782 (H) 956 (H) 213 (H) 115 (H)  Results for SABRINNA, YEARWOOD (MRN [id number]) as of 10/04/2013 15:11  Ref. Range 10/03/2013 19:05  Hemoglobin A1C Latest Range: <5.7 % 9.5 (H)   Spoke to patient regarding elevated A1C.  Patient states that she has not been taking her metformin the last few weeks due to diarrhea.  She see's Dr. Reche Dixon in [facility] [place].   Agree with the start of basal insulin.  Discussed insulin therapy with patient however she did not seem open to it.  CBG's improved.  Will follow.  If patient is to be discharged home on insulin she will need to be taught insulin administration prior to discharge.  Thanks, Beryl Meager, RN, BC-ADM Inpatient Diabetes Coordinator Pager [phone]

## 2013-10-04 NOTE — Plan of Care (Signed)
Problem: Food- and Nutrition-Related Knowledge Deficit (NB-1.1) Goal: Nutrition education Formal process to instruct or train a patient/client in a skill or to impart knowledge to help patients/clients voluntarily manage or modify food choices and eating behavior to maintain or improve health. Outcome: Completed/Met Date Met:  10/04/13 Nutrition Education Note  RD consulted for nutrition education regarding Low Sodium.  RD provided "Low Sodium Nutrition Therapy" handout from the Academy of [employer]etics. Reviewed patient's dietary recall. Provided examples on ways to decrease sodium intake in diet. Discouraged intake of processed foods and use of salt shaker. Encouraged fresh fruits and vegetables as well as whole grain sources of carbohydrates to maximize fiber intake.   RD discussed why it is important for patient to adhere to diet recommendations, and emphasized the role of fluids, foods to avoid, and importance of weighing self daily. Teach back method used.  Expect fair compliance.  Body mass index is 46.85 kg/(m^2). Pt meets criteria for Obese Class III based on current BMI.  Current diet order is Heart Healthy, patient is consuming approximately 75-100% of meals at this time. Labs and medications reviewed. No further nutrition interventions warranted at this time. RD contact information provided. If additional nutrition issues arise, please re-consult RD.   Jarold Motto MS, RD, LDN Pager: [phone] After-hours pager: [phone]

## 2013-10-04 NOTE — Progress Notes (Signed)
Chart review complete.  Patient is not eligible for [facility] Care Management services because his/her PCP is not a [facility] primary care provider or is not [facility] affiliated.  For any additional questions or new referrals please contact Tim Henderson BSN RN [facility] Liaison at [phone] °

## 2013-10-05 DIAGNOSIS — G4733 Obstructive sleep apnea (adult) (pediatric): Secondary | ICD-10-CM

## 2013-10-05 DIAGNOSIS — I5033 Acute on chronic diastolic (congestive) heart failure: Principal | ICD-10-CM

## 2013-10-05 LAB — BASIC METABOLIC PANEL
CO2: 24 mEq/L (ref 19–32)
Calcium: 8.4 mg/dL (ref 8.4–10.5)
Creatinine, Ser: 0.73 mg/dL (ref 0.50–1.10)
GFR calc Af Amer: >90 mL/min (ref >90)
Glucose, Bld: 132 mg/dL — ABNORMAL HIGH (ref 70–99)
Sodium: 138 mEq/L (ref 135–145)

## 2013-10-05 LAB — PROTIME-INR
INR: 2.53 — ABNORMAL HIGH (ref 0.00–1.49)
Prothrombin Time: 26.4 seconds — ABNORMAL HIGH (ref 11.6–15.2)

## 2013-10-05 LAB — GLUCOSE, CAPILLARY: Glucose-Capillary: 136 mg/dL — ABNORMAL HIGH (ref 70–99)

## 2013-10-05 MED ORDER — WARFARIN SODIUM 2.5 MG PO TABS
2.5000 mg | ORAL_TABLET | Freq: Once | ORAL | Status: DC
  Filled 2013-10-05: qty 1

## 2013-10-05 MED ORDER — AMIODARONE HCL 200 MG PO TABS: 200.0000 mg | ORAL_TABLET | Freq: Every day | ORAL | Status: DC

## 2013-10-05 MED ORDER — SPIRONOLACTONE 12.5 MG HALF TABLET: 12.5000 mg | ORAL_TABLET | Freq: Every day | ORAL | Status: DC

## 2013-10-05 MED ORDER — LINAGLIPTIN 5 MG PO TABS: 5.0000 mg | ORAL_TABLET | Freq: Every day | ORAL | Status: DC

## 2013-10-05 MED ORDER — POTASSIUM CHLORIDE CRYS ER 20 MEQ PO TBCR: 20.0000 meq | EXTENDED_RELEASE_TABLET | Freq: Two times a day (BID) | ORAL | Status: DC

## 2013-10-05 MED ORDER — AMIODARONE HCL 200 MG PO TABS: 200.0000 mg | ORAL_TABLET | Freq: Two times a day (BID) | ORAL | Status: DC

## 2013-10-05 NOTE — Progress Notes (Signed)
ANTICOAGULATION CONSULT NOTE - Follow Up Consult  Pharmacy Consult for coumadin Indication: atrial fibrillation  No Known Allergies  Patient Measurements: Height: 5\' 1"  (154.9 cm) Weight: 247 lb 1.6 oz (112.084 kg) IBW/kg (Calculated) : 47.8  Vital Signs: Temp: 97.9 F (36.6 C) (12/17 0639) Temp src: Oral (12/17 0639) BP: 130/49 mmHg (12/17 0939) Pulse Rate: 60 (12/17 0939)  Labs:  Recent Labs  10/03/13 1208 10/03/13 1905 10/04/13 0610 10/04/13 2210 10/05/13 0356  HGB 12.9 12.1  --   --   --   HCT 39.8 37.5  --   --   --   PLT 369 366  --   --   --   LABPROT 22.1*  --  23.2*  --  26.4*  INR 2.00*  --  2.14*  --  2.53*  CREATININE 0.57 0.67 0.69 0.89 0.73  TROPONINI <0.30  --   --   --   --     Estimated Creatinine Clearance: 77 ml/min (by C-G formula based on Cr of 0.73).  Assessment: AC is a 72 yo F admitted on 12/15 with SOB and a prolonged QTc interval on amiodarone. (Per notes, patient was to decrease amiodarone dose to 200 mg daily but misunderstood and continued to take 300 mg BID, and she presented with signs/symptoms suggestive of amiodarone toxicity.) Patient has a history of atrial fibrillation for which she takes coumadin. Her home dose is 5 mg on Saturday, and 2.5 mg on all other days. Pharmacy has been consulted to dose her coumadin.  Patient's INR remains therapeutic and trending up after higher doses given 12/14 and 12/15. No bleeding issues noted.   Goal of Therapy:  INR 2-3 Monitor platelets by anticoagulation protocol: Yes   Plan:  -Give coumadin 2.5 mg PO x 1 tonight -Daily INR -Monitor signs/symptoms of bleeding  Christoper Fabian, PharmD, BCPS Clinical pharmacist, pager [phone] 10/05/2013 10:05 AM

## 2013-10-05 NOTE — Discharge Summary (Addendum)
Physician Discharge Summary  [name] ZOX:[id number] DOB: 04/19/1941 DOA: 10/03/2013  PCP: Mia Creek, MD  Admit date: 10/03/2013 Discharge date: 10/05/2013  Recommendations for Outpatient Follow-up:  1. Recommend close F/U of glycemic control given Hgb A1c of 9.5% (started on Tradjenta).  Discharge Diagnoses:  Principal Problem:    Acute diastolic congestive heart failure, NYHA class 3 Active Problems:    OBSTRUCTIVE SLEEP APNEA    HYPERTENSION    ATRIAL FIBRILLATION, PAROXYSMAL    Hypokalemia    Prolonged QT interval    Acute diastolic heart failure  Discharge Condition: Improved.  Diet recommendation: Low sodium, heart healthy, carb modified.  History of present illness:  Patient is a 72 year old female with a PMH of paroxysmal atrial fibrillation on chronic Coumadin and amiodarone, diastolic CHF with an EF of 55%, and OSA who was admitted on 10/03/2013 with worsening shortness of breath.  Hospital Course by problem:  Acute diastolic congestive heart failure, NYHA class 3:  - Treated with IV Lasix, strict I.'s O's, fluid restriction and a low-sodium diet.  - started low-dose spironolactone, Continue ACE inhibitor.  - currently not on a beta blocker due to her low heart rate.  Prolonged QT interval:  - held her amiodarone, repleted potassium and magnesium with improvement of her QTC. Potassium did not improved, Most likely due to lasix IV.  - Appreciate Dr. Graciela Husbands and Dr. Elvis Coil assistance.  Hypokalemia:  - repleted potassium IV and by mouth, with high K+ today (hold KCL today, and resume home dose KCL 10/06/13).  - Continue low-dose spironolactone.  Transaminitis:  - This probably most likely to amiodarone.  - Resume 200 mg daily (lower dose) per cardiology recommendations.  HYPERTENSION:  - Blood pressure continues to improve continue diuresis with Lasix, continue ACE inhibitor.  - Low dose spironolactone.  ATRIAL FIBRILLATION, PAROXYSMAL:  -  rate controlled INR therapeutic.  OBSTRUCTIVE SLEEP APNEA  - c-pap provided. Diabetes - Non-compliant with metformin. - Encouraged to take metformin. Started on Tradjenta per DM coordinator recommendations. - Hemoglobin A1c 9.5%.  Procedures:  None.  Consultations:  Cardiology.  Discharge Exam: Filed Vitals:   10/05/13 1345  BP: 106/45  Pulse: 61  Temp: 97.9 F (36.6 C)  Resp: 18   Filed Vitals:   10/05/13 0639 10/05/13 0939 10/05/13 1203 10/05/13 1345  BP: 149/64 130/49  106/45  Pulse: 56 60  61  Temp: 97.9 F (36.6 C)   97.9 F (36.6 C)  TempSrc: Oral   Oral  Resp: 18   18  Height:      Weight: 112.084 kg (247 lb 1.6 oz)     SpO2: 96%  97% 97%    Gen:  NAD Cardiovascular:  RRR, No M/R/G Respiratory: Lungs CTAB Gastrointestinal: Abdomen soft, NT/ND with normal active bowel sounds. Extremities: No C/E/C   Discharge Instructions  Discharge Orders   Future Appointments Provider Department Dept Phone   07/13/2014 10:30 AM Barbaraann Share, MD [facility] [phone]   Future Orders Complete By Expires   (HEART FAILURE PATIENTS) Call MD:  Anytime you have any of the following symptoms: 1) 3 pound weight gain in 24 hours or 5 pounds in 1 week 2) shortness of breath, with or without a dry hacking cough 3) swelling in the hands, feet or stomach 4) if you have to sleep on extra pillows at night in order to breathe.  As directed    Diet - low sodium heart healthy  As directed    Discharge instructions  As directed    Comments:     It is very important that she followup closely with your primary care doctor for your diabetes. Check your blood sugars before each meal and at bedtime and keep a log of the readings to bring with you when you see your doctor so he can make adjustments in your therapy. Uncontrolled blood sugars can be associated with an increased risk of heart disease, kidney disease, strokes, eyesight problems and nerve damage.  You were cared for  by Dr. Hillery Aldo  (a hospitalist) during your hospital stay. If you have any questions about your discharge medications or the care you received while you were in the hospital after you are discharged, you can call the unit and ask to speak with the hospitalist on call if the hospitalist that took care of you is not available. Once you are discharged, your primary care physician will handle any further medical issues. Please note that NO REFILLS for any discharge medications will be authorized once you are discharged, as it is imperative that you return to your primary care physician (or establish a relationship with a primary care physician if you do not have one) for your aftercare needs so that they can reassess your need for medications and monitor your lab values.  Any outstanding tests can be reviewed by your PCP at your follow up visit.  It is also important to review any medicine changes with your PCP.  Please bring these d/c instructions with you to your next visit so your physician can review these changes with you.  If you do not have a primary care physician, you can call [phone] for a physician referral.  It is highly recommended that you obtain a PCP for hospital follow up.   Face-to-face encounter (required for Medicare/Medicaid patients)  As directed    Comments:     I [name],[name] certify that this patient is under my care and that I, or a nurse practitioner or physician's assistant working with me, had a face-to-face encounter that meets the physician face-to-face encounter requirements with this patient on 10/05/2013. The encounter with the patient was in whole, or in part for the following medical condition(s) which is the primary reason for home health care (List medical condition): CHF exacerbation requiring teaching of disease process and management, uncontrolled diabetes requiring skilled observation, teaching, and physical therapy for Increasing endurance.   Questions:     The  encounter with the patient was in whole, or in part, for the following medical condition, which is the primary reason for home health care:  CHF exacerbation, deconditioning, uncontrolled DM   I certify that, based on my findings, the following services are medically necessary home health services:  Nursing   Physical therapy   My clinical findings support the need for the above services:  Shortness of breath with activity   Further, I certify that my clinical findings support that this patient is homebound due to:  Shortness of Breath with activity   Reason for Medically Necessary Home Health Services:  Skilled Nursing- Teaching of Disease Process/Symptom Management   Skilled Nursing- Changes in Medication/Medication Management   Skilled Nursing- Skilled Assessment/Observation   Therapy- Therapeutic Exercises to Increase Strength and Endurance   Home Health  As directed    Questions:     To provide the following care/treatments:  PT   RN   Increase activity slowly  As directed        Medication List  STOP taking these medications       amLODipine 5 MG tablet  Commonly known as:  NORVASC      TAKE these medications       allopurinol 300 MG tablet  Commonly known as:  ZYLOPRIM  Take 300 mg by mouth daily. Take one tablet  daily     amiodarone 200 MG tablet  Commonly known as:  PACERONE  Take 1 tablet (200 mg total) by mouth daily.     cloNIDine 0.2 MG tablet  Commonly known as:  CATAPRES  Take 0.2 mg by mouth 2 (two) times daily.     furosemide 40 MG tablet  Commonly known as:  LASIX  Take 40 mg by mouth daily.     levothyroxine 100 MCG tablet  Commonly known as:  SYNTHROID, LEVOTHROID  Take 100 mcg by mouth daily before breakfast.     linagliptin 5 MG Tabs tablet  Commonly known as:  TRADJENTA  Take 1 tablet (5 mg total) by mouth daily.     lisinopril 10 MG tablet  Commonly known as:  PRINIVIL,ZESTRIL  Take 1 tablet (10 mg total) by mouth daily.     metFORMIN  500 MG tablet  Commonly known as:  GLUCOPHAGE  Take one tablet daily     potassium chloride SA 20 MEQ tablet  Commonly known as:  K-DUR,KLOR-CON  Take 2 tablets (40 mEq total) by mouth twice a day.     spironolactone 12.5 mg Tabs tablet  Commonly known as:  ALDACTONE  Take 0.5 tablets (12.5 mg total) by mouth daily.     warfarin 5 MG tablet  Commonly known as:  COUMADIN  Take 2.5-5 mg by mouth daily. 5 mg on Saturdays and 2.5 mg all other days.           Follow-up Information   Follow up with TALBOT, DAVID C, MD. Schedule an appointment as soon as possible for a visit on 10/12/2013. [facility] follow up of diabetes, heart failure @9 :00 am spoke with [facility])    Specialty:  Internal Medicine   Contact information:   [address] [place] [address] [phone]        The results of significant diagnostics from this hospitalization (including imaging, microbiology, ancillary and laboratory) are listed below for reference.    Significant Diagnostic Studies: Dg Chest 2 View  10/03/2013   CLINICAL DATA:  Shortness of breath and weakness.  EXAM: CHEST  2 VIEW  COMPARISON:  09/02/2013.  FINDINGS: Trachea is midline. Heart is mildly enlarged, stable. Thoracic aorta is calcified. There is mixed interstitial and airspace disease with peripheral septal lines. Small bilateral effusions. Degenerative changes are seen in the mid thoracic spine.  IMPRESSION: Congestive heart failure.   Electronically Signed   By: Leanna Battles M.D.   On: 10/03/2013 11:47    Labs:  Basic Metabolic Panel:  Recent Labs Lab 10/03/13 1208 10/03/13 1905 10/04/13 0610 10/04/13 2210 10/05/13 0356  NA 141 141 138 137 138  K 3.4* 2.9* 2.8* 4.5 5.9*  CL 100 100 98 99 104  CO2 28 30 26 29 24   GLUCOSE 224* 213* 175* 112* 132*  BUN 12 11 11 16 15   CREATININE 0.57 0.67 0.69 0.89 0.73  CALCIUM 8.9 8.5 8.4 8.4 8.4  MG  --  1.7 2.4  --   --    GFR Estimated Creatinine Clearance: 77 ml/min  (by C-G formula based on Cr of 0.73). Liver Function Tests:  Recent Labs  Lab 10/03/13 1905  AST 94*  ALT 87*  ALKPHOS 153*  BILITOT 0.5  PROT 7.0  ALBUMIN 3.1*   Coagulation profile  Recent Labs Lab 10/03/13 1208 10/04/13 0610 10/05/13 0356  INR 2.00* 2.14* 2.53*    CBC:  Recent Labs Lab 10/03/13 1208 10/03/13 1905  WBC 9.4 10.1  NEUTROABS 6.8  --   HGB 12.9 12.1  HCT 39.8 37.5  MCV 77.6* 78.9  PLT 369 366   Cardiac Enzymes:  Recent Labs Lab 10/03/13 1208  TROPONINI <0.30   CBG:  Recent Labs Lab 10/04/13 1555 10/04/13 2132 10/05/13 0716 10/05/13 1110 10/05/13 1607  GLUCAP 152* 101* 136* 136* 147*   Hgb A1c  Recent Labs  10/03/13 1905  HGBA1C 9.5*   Thyroid function studies  Recent Labs  10/04/13 0610  TSH 2.594    Time coordinating discharge: 35 minutes.  Signed:  [name],[name]  Pager [phone] [facility] 10/05/2013, 5:33 PM

## 2013-10-05 NOTE — Progress Notes (Signed)
Patient evaluated for community based chronic disease management services with [facility] Care Management Program as a benefit of patient's [employer]. Spoke with patient at bedside to explain [facility] Care Management services.  Patient has been admitted three times in six months.  Last admission was for PNA per patients account and presently is readmitted with acute on chronic CHF.  Patient received a [employer] telemonitoring station by mail two days prior to her admission.  She did not know how to install the equipment and therefore did not weigh herself.  On the day of admission she indicated that she called Dr Benjaman Lobe office for a sick appointment but was not able to get one scheduled.  She became frustrated with the delay and called 911 due to her SOB.  Writer requested that a HHRN be added at discharge for direct hands on assistance with the telescale installation, CHF management, and Diabetes education.  Reached out to the [facility] Liaison to confirm that the patient was enrolled in their chronic disease management program.  It was confirmed that she is enrolled.  [facility] will work collaboratively with the home health of choice and [employer] to ensure that this patients plan of care is initiated.  We have attempted to engage her at home before without success because we could not get return calls to establish visits.  Writer reminded patient that none of these resources can benefit her unless she mutually engages them.  She expressed understanding of this request.  Written consents obtained and contact information verified.    Patient will receive a post discharge transition of care call and will be evaluated for monthly home visits for assessments and disease process education.  Left contact information and [facility] literature at bedside. Made Inpatient Case Manager aware that [facility] Care Management following. Of note, [facility] Care Management services does not replace or interfere with any services that are arranged by inpatient case  management or social work.  For additional questions or referrals please contact Anibal Henderson BSN RN [facility] Liaison at [phone].

## 2013-10-05 NOTE — Progress Notes (Signed)
Patient discharged to home with family.  IV removed prior to discharge, IV site clean, dry, and intact.  Discharge instructions discussed with patient prior to discharge; patient voiced understanding of discharge instructions and education.

## 2013-10-05 NOTE — Progress Notes (Signed)
Patient Name: [name]      SUBJECTIVE: admitted with sob On amio for afib for HFpEF  ? amio toxicity Mild LFT abnormalities concurrent with Amio although also noted slightly yup 5/13     Past Medical History  Diagnosis Date  . Atrial fibrillation /flutter   . Hypertension     a.  Renal arterial Dopplers 12/2011: 1-59% right renal artery stenosis  . Diabetes mellitus   . Hyperlipidemia   . Gout   . 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 Diastolic CHF, chronic     a.  echo 2006 - ef 55-65%; mild diast dysfxn;    b. Echo 08/2011: Mild LVH, EF 60%.   . Arthritis   . Morbid obesity   . CHF (congestive heart failure)   . Back pain   . Chronic kidney disease   . Obstructive sleep apnea on CPAP   . Chronic anticoagulation     due to afib    Scheduled Meds:  Scheduled Meds: . allopurinol  300 mg Oral Daily  . cloNIDine  0.2 mg Oral BID  . furosemide  40 mg Intravenous Q12H  . insulin aspart  0-15 Units Subcutaneous TID WC  . insulin aspart  0-5 Units Subcutaneous QHS  . insulin aspart  6 Units Subcutaneous TID WC  . insulin glargine  30 Units Subcutaneous QHS  . levothyroxine  100 mcg Oral QAC breakfast  . lisinopril  10 mg Oral Daily  . pantoprazole  40 mg Oral Daily  . potassium chloride  60 mEq Oral TID  . sodium chloride  3 mL Intravenous Q12H  . spironolactone  12.5 mg Oral Daily  . Warfarin - Pharmacist Dosing Inpatient   Does not apply q1800   Continuous Infusions:   PHYSICAL EXAM Filed Vitals:   10/04/13 0920 10/04/13 2110 10/05/13 0020 10/05/13 0639  BP: 125/35 160/79 142/60 149/64  Pulse: 57 57  56  Temp: 98.1 F (36.7 C) 97.6 F (36.4 C)  97.9 F (36.6 C)  TempSrc: Oral Oral  Oral  Resp: 18 18  18   Height:      Weight:    247 lb 1.6 oz (112.084 kg)  SpO2: 98% 99%  96%    Well developed and nourished in no acute distress HENT normal Neck supple with JVP-flat Clear Regular rate and rhythm, no murmurs  or gallops Abd-soft with active BS No Clubbing cyanosis edema Skin-warm and dry A & Oriented  Grossly normal sensory and motor function   TELEMETRY: Reviewed telemetry pt in NSR:    Intake/Output Summary (Last 24 hours) at 10/05/13 0757 Last data filed at 10/05/13 0643  Gross per 24 hour  Intake    843 ml  Output    801 ml  Net     42 ml    LABS: Basic Metabolic Panel:  Recent Labs Lab 10/03/13 1208 10/03/13 1905 10/04/13 0610 10/04/13 2210 10/05/13 0356  NA 141 141 138 137 138  K 3.4* 2.9* 2.8* 4.5 5.9*  CL 100 100 98 99 104  CO2 28 30 26 29 24   GLUCOSE 224* 213* 175* 112* 132*  BUN 12 11 11 16 15   CREATININE 0.57 0.67 0.69 0.89 0.73  CALCIUM 8.9 8.5 8.4 8.4 8.4  MG  --  1.7 2.4  --   --    Cardiac Enzymes:  Recent Labs  10/03/13 1208  TROPONINI <0.30   CBC:  Recent  Labs Lab 10/03/13 1208 10/03/13 1905  WBC 9.4 10.1  NEUTROABS 6.8  --   HGB 12.9 12.1  HCT 39.8 37.5  MCV 77.6* 78.9  PLT 369 366   PROTIME:  Recent Labs  10/03/13 1208 10/04/13 0610 10/05/13 0356  LABPROT 22.1* 23.2* 26.4*  INR 2.00* 2.14* 2.53*   Liver Function Tests:  Recent Labs  10/03/13 1905  AST 94*  ALT 87*  ALKPHOS 153*  BILITOT 0.5  PROT 7.0  ALBUMIN 3.1*   No results found for this basename: LIPASE, AMYLASE,  in the last 72 hours BNP: BNP (last 3 results)  Recent Labs  04/05/13 1355 04/21/13 1518 10/03/13 1208  PROBNP 188.6* 308.3* 396.9*   D-Dimer: No results found for this basename: DDIMER,  in the last 72 hours Hemoglobin A1C:  Recent Labs  10/03/13 1905  HGBA1C 9.5*     ASSESSMENT AND PLAN:  Principal Problem:   Acute diastolic congestive heart failure, NYHA class 3 Active Problems:   ATRIAL FIBRILLATION, PAROXYSMAL   OBSTRUCTIVE SLEEP APNEA   HYPERTENSION   Hypokalemia   Prolonged QT interval   Acute diastolic heart failure  Resume amio at 200 qd Follow LFTs Recheck K as hemolysis evident and we need to [facility] given  aldactone DM educator  Signed, Sherryl Manges MD  10/05/2013

## 2013-10-05 NOTE — Progress Notes (Signed)
Physical Therapy Treatment Patient Details Name: [name] MRN: [id number] DOB: August 24, 1941 Today's Date: 10/05/2013 Time: 1478-2956 PT Time Calculation (min): 11 min  PT Assessment / Plan / Recommendation  History of Present Illness Pt admit for CHF.   PT Comments   Patient with improved mobility today.  Achieved PT goals - revised.  Follow Up Recommendations  Home health PT;Supervision/Assistance - 24 hour     Does the patient have the potential to tolerate intense rehabilitation     Barriers to Discharge        Equipment Recommendations  None recommended by PT    Recommendations for Other Services    Frequency Min 3X/week   Progress towards PT Goals Progress towards PT goals: Goals met and updated - see care plan  Plan Current plan remains appropriate    Precautions / Restrictions Precautions Precautions: Fall Restrictions Weight Bearing Restrictions: No   Pertinent Vitals/Pain Patient with O2 sat at 97% with ambulation on room air.    Mobility  Bed Mobility Bed Mobility: Supine to Sit;Sitting - Scoot to Edge of Bed Supine to Sit: 5: Supervision;With rails Sitting - Scoot to Edge of Bed: 5: Supervision Details for Bed Mobility Assistance: incr time and pt uses momentum. Transfers Transfers: Sit to Stand;Stand to Sit Sit to Stand: 5: Supervision;With upper extremity assist;From bed Stand to Sit: 5: Supervision;With upper extremity assist;To bed;With armrests;To chair/3-in-1 Details for Transfer Assistance: Uses safe technique Ambulation/Gait Ambulation/Gait Assistance: 5: Supervision Ambulation Distance (Feet): 180 Feet Assistive device: None Ambulation/Gait Assistance Details: Patient able to ambulate without assistive device today with fairly good balance.  Patient ambulated on room air with O2 sat at 97%.   Gait Pattern: Step-through pattern;Decreased step length - right;Decreased step length - left;Lateral trunk lean to right;Lateral trunk lean to  left;Wide base of support Gait velocity: decreased      PT Goals (current goals can now be found in the care plan section) Acute Rehab PT Goals PT Goal Formulation: With patient Time For Goal Achievement: 10/12/13 Potential to Achieve Goals: Good  Visit Information  Last PT Received On: 10/05/13 Assistance Needed: +1 History of Present Illness: Pt admit for CHF.    Subjective Data  Subjective: "I just use my RW when I have gout"   Cognition  Cognition Arousal/Alertness: Awake/alert Behavior During Therapy: WFL for tasks assessed/performed Overall Cognitive Status: Within Functional Limits for tasks assessed    Balance  Balance Balance Assessed: Yes High Level Balance High Level Balance Activites: Turns;Head turns;Sudden stops High Level Balance Comments: Slight decrease in balance with high level balance activities  End of Session PT - End of Session Equipment Utilized During Treatment: Gait belt Activity Tolerance: Patient limited by fatigue Patient left: in chair;with call bell/phone within reach Nurse Communication: Mobility status (O2 sats at 97% on room air)   GP     Vena Austria 10/05/2013, 1:22 PM Durenda Hurt. Renaldo Fiddler, [facility] Acute Rehab Services Pager [phone]

## 2013-10-05 NOTE — Progress Notes (Signed)
Inpatient Diabetes Program Recommendations  AACE/ADA: New Consensus Statement on Inpatient Glycemic Control (2013)  Target Ranges:  Prepandial:   less than 140 mg/dL      Peak postprandial:   less than 180 mg/dL (1-2 hours)      Critically ill patients:  140 - 180 mg/dL   Reason for Visit: Patient to be discharged home tonight.  Will be discharged home on oral agents for diabetes.  Discussed elevated A1C with patient.  Encouraged her to follow-up with PCP Dr. Reche Dixon ASAP.  Encouraged her to check CBG's daily and take values to MD.    Beryl Meager, RN, BC-ADM Inpatient Diabetes Coordinator Pager [phone]

## 2013-10-05 NOTE — Progress Notes (Signed)
Pt. Alert and oriented this am. No s/s of distress or discomfort noted. Pt. Stated she rested well during the night. Pt. Denies pain this am. RN will continue to monitor pt. For changes in condition. Shequilla Goodgame, Cheryll Dessert

## 2013-10-20 ENCOUNTER — Emergency Department (HOSPITAL_COMMUNITY)
Admission: EM | Admit: 2013-10-20 | Discharge: 2013-10-20 | Disposition: A | Discharge status: Home / Self Care | Payer: Medicare Other | Attending: Emergency Medicine | Admitting: Emergency Medicine

## 2013-10-20 ENCOUNTER — Encounter (HOSPITAL_COMMUNITY): Payer: Self-pay | Admitting: Emergency Medicine

## 2013-10-20 ENCOUNTER — Emergency Department (HOSPITAL_COMMUNITY): Payer: Medicare Other

## 2013-10-20 DIAGNOSIS — I4892 Unspecified atrial flutter: Secondary | ICD-10-CM | POA: Insufficient documentation

## 2013-10-20 DIAGNOSIS — M109 Gout, unspecified: Secondary | ICD-10-CM | POA: Insufficient documentation

## 2013-10-20 DIAGNOSIS — N189 Chronic kidney disease, unspecified: Secondary | ICD-10-CM | POA: Insufficient documentation

## 2013-10-20 DIAGNOSIS — Z9089 Acquired absence of other organs: Secondary | ICD-10-CM | POA: Insufficient documentation

## 2013-10-20 DIAGNOSIS — I129 Hypertensive chronic kidney disease with stage 1 through stage 4 chronic kidney disease, or unspecified chronic kidney disease: Secondary | ICD-10-CM | POA: Insufficient documentation

## 2013-10-20 DIAGNOSIS — E785 Hyperlipidemia, unspecified: Secondary | ICD-10-CM | POA: Insufficient documentation

## 2013-10-20 DIAGNOSIS — Z79899 Other long term (current) drug therapy: Secondary | ICD-10-CM | POA: Insufficient documentation

## 2013-10-20 DIAGNOSIS — I5032 Chronic diastolic (congestive) heart failure: Secondary | ICD-10-CM | POA: Insufficient documentation

## 2013-10-20 DIAGNOSIS — I4891 Unspecified atrial fibrillation: Secondary | ICD-10-CM | POA: Insufficient documentation

## 2013-10-20 DIAGNOSIS — Z7901 Long term (current) use of anticoagulants: Secondary | ICD-10-CM | POA: Insufficient documentation

## 2013-10-20 DIAGNOSIS — G4733 Obstructive sleep apnea (adult) (pediatric): Secondary | ICD-10-CM | POA: Insufficient documentation

## 2013-10-20 DIAGNOSIS — J209 Acute bronchitis, unspecified: Secondary | ICD-10-CM | POA: Insufficient documentation

## 2013-10-20 DIAGNOSIS — Z9981 Dependence on supplemental oxygen: Secondary | ICD-10-CM | POA: Insufficient documentation

## 2013-10-20 DIAGNOSIS — E119 Type 2 diabetes mellitus without complications: Secondary | ICD-10-CM | POA: Insufficient documentation

## 2013-10-20 DIAGNOSIS — Z8739 Personal history of other diseases of the musculoskeletal system and connective tissue: Secondary | ICD-10-CM | POA: Insufficient documentation

## 2013-10-20 DIAGNOSIS — J40 Bronchitis, not specified as acute or chronic: Secondary | ICD-10-CM

## 2013-10-20 LAB — BASIC METABOLIC PANEL
BUN: 11 mg/dL (ref 6–23)
CO2: 31 meq/L (ref 19–32)
Calcium: 8.6 mg/dL (ref 8.4–10.5)
Chloride: 96 mEq/L (ref 96–112)
Creatinine, Ser: 0.67 mg/dL (ref 0.50–1.10)
GFR calc Af Amer: >90 mL/min (ref >90)
GFR calc non Af Amer: 87 mL/min — ABNORMAL LOW (ref >90)
GLUCOSE: 155 mg/dL — AB (ref 70–99)
Potassium: 3 mEq/L — ABNORMAL LOW (ref 3.7–5.3)
Sodium: 139 mEq/L (ref 137–147)

## 2013-10-20 LAB — PRO B NATRIURETIC PEPTIDE: Pro B Natriuretic peptide (BNP): 281.7 pg/mL — ABNORMAL HIGH (ref 0–125)

## 2013-10-20 LAB — CBC
HEMATOCRIT: 39.6 % (ref 36.0–46.0)
HEMOGLOBIN: 12.8 g/dL (ref 12.0–15.0)
MCH: 25.3 pg — AB (ref 26.0–34.0)
MCHC: 32.3 g/dL (ref 30.0–36.0)
MCV: 78.3 fL (ref 78.0–100.0)
Platelets: 282 10*3/uL (ref 150–400)
RBC: 5.06 MIL/uL (ref 3.87–5.11)
RDW: 16.5 % — ABNORMAL HIGH (ref 11.5–15.5)
WBC: 8.8 10*3/uL (ref 4.0–10.5)

## 2013-10-20 LAB — POCT I-STAT TROPONIN I: Troponin i, poc: 0 ng/mL (ref 0.00–0.08)

## 2013-10-20 MED ORDER — AZITHROMYCIN 250 MG PO TABS: ORAL_TABLET | ORAL | Status: DC

## 2013-10-20 MED ORDER — ASPIRIN 325 MG PO TABS
325.0000 mg | ORAL_TABLET | ORAL | Status: DC
  Filled 2013-10-20: qty 1

## 2013-10-20 NOTE — ED Provider Notes (Signed)
CSN: [id number]     Arrival date & time 10/20/13  1304 History   First MD Initiated Contact with Patient 10/20/13 1426     Chief Complaint  Patient presents with  . Shortness of Breath   (Consider location/radiation/quality/duration/timing/severity/associated sxs/prior Treatment) Patient is a 73 y.o. female presenting with shortness of breath.  Shortness of Breath  Pt with multiple medical problems recently admitted for CHF exacerbation reports a week of cough, productive of yellow sputum but no fever. Had some SOB today, but no leg swelling. No exertional chest pains.   Past Medical History  Diagnosis Date  . Atrial fibrillation /flutter   . Hypertension     a.  Renal arterial Dopplers 12/2011: 1-59% right renal artery stenosis  . Diabetes mellitus   . Hyperlipidemia   . Gout   . 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 Diastolic CHF, chronic     a.  echo 2006 - ef 55-65%; mild diast dysfxn;    b. Echo 08/2011: Mild LVH, EF 60%.   . Arthritis   . Morbid obesity   . CHF (congestive heart failure)   . Back pain   . Chronic kidney disease   . Obstructive sleep apnea on CPAP   . Chronic anticoagulation     due to afib   Past Surgical History  Procedure Laterality Date  . Cholecystectomy    . Total abdominal hysterectomy    . Appendectomy    . Tonsillectomy  1982  . Cardioversion  10/22/2011    Procedure: CARDIOVERSION;  Surgeon: [name], MD;  Location: [facility] OR;  Service: Cardiovascular;  Laterality: N/A;   Family History  Problem Relation Age of Onset  . Heart disease Father   . Hypertension Father   . Breast cancer Sister   . Cancer Sister     breast   History  Substance Use Topics  . Smoking status: Never Smoker   . Smokeless tobacco: Never Used  . Alcohol Use: No   OB History   Grav Para Term Preterm Abortions TAB SAB Ect Mult Living                 Review of Systems  Respiratory: Positive for shortness of breath.    All other  systems reviewed and are negative except as noted in HPI.   Allergies  Review of patient's allergies indicates no known allergies.  Home Medications   Current Outpatient Rx  Name  Route  Sig  Dispense  Refill  . allopurinol (ZYLOPRIM) 300 MG tablet   Oral   Take 300 mg by mouth daily. Take one tablet  daily         . amiodarone (PACERONE) 200 MG tablet   Oral   Take 1 tablet (200 mg total) by mouth daily.   90 tablet   3   . amLODipine (NORVASC) 5 MG tablet   Oral   Take 5 mg by mouth daily.         . cloNIDine (CATAPRES) 0.2 MG tablet   Oral   Take 0.2 mg by mouth 2 (two) times daily.         . furosemide (LASIX) 40 MG tablet   Oral   Take 40 mg by mouth daily.         Marland Kitchen levothyroxine (SYNTHROID, LEVOTHROID) 100 MCG tablet   Oral   Take 100 mcg by mouth daily before breakfast.         .  linagliptin (TRADJENTA) 5 MG TABS tablet   Oral   Take 1 tablet (5 mg total) by mouth daily.   30 tablet   3   . lisinopril (PRINIVIL,ZESTRIL) 10 MG tablet   Oral   Take 1 tablet (10 mg total) by mouth daily.   30 tablet   0   . potassium chloride SA (K-DUR,KLOR-CON) 20 MEQ tablet      Take 2 tablets (40 mEq total) by mouth twice a day.   120 tablet   3   . spironolactone (ALDACTONE) 12.5 mg TABS tablet   Oral   Take 0.5 tablets (12.5 mg total) by mouth daily.   30 tablet   3   . warfarin (COUMADIN) 5 MG tablet   Oral   Take 2.5-5 mg by mouth daily. 5 mg on Saturdays and 2.5 mg all other days.          BP 143/59  Pulse 58  Temp(Src) 98.4 F (36.9 C) (Oral)  Resp 22  SpO2 93% Physical Exam  Nursing note and vitals reviewed. Constitutional: She is oriented to person, place, and time. She appears well-developed and well-nourished.  HENT:  Head: Normocephalic and atraumatic.  Eyes: EOM are normal. Pupils are equal, round, and reactive to light.  Neck: Normal range of motion. Neck supple.  Cardiovascular: Normal rate, normal heart sounds and intact  distal pulses.   Pulmonary/Chest: Effort normal and breath sounds normal. No respiratory distress. She has no wheezes. She has no rales.  Abdominal: Bowel sounds are normal. She exhibits no distension. There is no tenderness.  Musculoskeletal: Normal range of motion. She exhibits no edema and no tenderness.  Neurological: She is alert and oriented to person, place, and time. She has normal strength. No cranial nerve deficit or sensory deficit.  Skin: Skin is warm and dry. No rash noted.  Psychiatric: She has a normal mood and affect.    ED Course  Procedures (including critical care time) Labs Review Labs Reviewed  CBC - Abnormal; Notable for the following:    MCH 25.3 (*)    RDW 16.5 (*)    All other components within normal limits  BASIC METABOLIC PANEL - Abnormal; Notable for the following:    Potassium 3.0 (*)    Glucose, Bld 155 (*)    GFR calc non Af Amer 87 (*)    All other components within normal limits  PRO B NATRIURETIC PEPTIDE  POCT I-STAT TROPONIN I   Imaging Review Dg Chest 2 View  10/20/2013   CLINICAL DATA:  Cough.  Shortness of breath.  Chest pain.  Weakness.  EXAM: CHEST  2 VIEW  COMPARISON:  10/03/2013  FINDINGS: Heart size is stable. There has been interval resolution of interstitial infiltrates, consistent with resolving interstitial edema. No evidence of pulmonary consolidation or pleural effusion.  IMPRESSION: Resolving interstitial edema since prior exam.  No acute findings.   Electronically Signed   By: Earle Gell M.D.   On: 10/20/2013 15:22     Date/Time:  Thursday October 20 2013 13:08:36 EST Ventricular Rate:  63 PR Interval:  184 QRS Duration: 82 QT Interval:  306 QTC Calculation: 313 R Axis:   5 Text Interpretation:  Normal sinus rhythm Low voltage QRS Nonspecific T wave abnormality Abnormal ECG No significant change since last tracing Reconfirmed by Zyra Parrillo  MD, Savva Beamer ([id number]) on 10/20/2013 3:48:58 PM    MDM   1. Bronchitis     Benign exam,  no significant hypoxia, well appearing. CXR  as above improved edema, no signs of PNA. Will d/c with Z-pak and close PCP followup.    Tamikia Chowning B. Karle Starch, MD 10/20/13 1549

## 2013-10-20 NOTE — Discharge Instructions (Signed)

## 2013-10-20 NOTE — ED Notes (Signed)
Attempted blood draw x1.  Called phlebotomy

## 2013-10-20 NOTE — ED Notes (Signed)
Pt reports 1 week hx of productive yellow cough. Denies fever. States this AM she started feeling increased SOB. Pt reports chest tightness. Hx of CHF.

## 2013-10-22 ENCOUNTER — Emergency Department (HOSPITAL_COMMUNITY)
Admission: EM | Admit: 2013-10-22 | Discharge: 2013-10-22 | Disposition: A | Discharge status: Home / Self Care | Payer: Medicare Other | Attending: Emergency Medicine | Admitting: Emergency Medicine

## 2013-10-22 ENCOUNTER — Encounter (HOSPITAL_COMMUNITY): Payer: Self-pay | Admitting: Emergency Medicine

## 2013-10-22 ENCOUNTER — Emergency Department (HOSPITAL_COMMUNITY): Payer: Medicare Other

## 2013-10-22 DIAGNOSIS — I129 Hypertensive chronic kidney disease with stage 1 through stage 4 chronic kidney disease, or unspecified chronic kidney disease: Secondary | ICD-10-CM | POA: Insufficient documentation

## 2013-10-22 DIAGNOSIS — Z79899 Other long term (current) drug therapy: Secondary | ICD-10-CM | POA: Insufficient documentation

## 2013-10-22 DIAGNOSIS — I4891 Unspecified atrial fibrillation: Secondary | ICD-10-CM | POA: Insufficient documentation

## 2013-10-22 DIAGNOSIS — Z792 Long term (current) use of antibiotics: Secondary | ICD-10-CM | POA: Insufficient documentation

## 2013-10-22 DIAGNOSIS — E119 Type 2 diabetes mellitus without complications: Secondary | ICD-10-CM | POA: Insufficient documentation

## 2013-10-22 DIAGNOSIS — M109 Gout, unspecified: Secondary | ICD-10-CM | POA: Insufficient documentation

## 2013-10-22 DIAGNOSIS — G4733 Obstructive sleep apnea (adult) (pediatric): Secondary | ICD-10-CM | POA: Insufficient documentation

## 2013-10-22 DIAGNOSIS — R110 Nausea: Secondary | ICD-10-CM | POA: Insufficient documentation

## 2013-10-22 DIAGNOSIS — Z8739 Personal history of other diseases of the musculoskeletal system and connective tissue: Secondary | ICD-10-CM | POA: Insufficient documentation

## 2013-10-22 DIAGNOSIS — J40 Bronchitis, not specified as acute or chronic: Secondary | ICD-10-CM

## 2013-10-22 DIAGNOSIS — J209 Acute bronchitis, unspecified: Secondary | ICD-10-CM | POA: Insufficient documentation

## 2013-10-22 DIAGNOSIS — N189 Chronic kidney disease, unspecified: Secondary | ICD-10-CM | POA: Insufficient documentation

## 2013-10-22 DIAGNOSIS — I5032 Chronic diastolic (congestive) heart failure: Secondary | ICD-10-CM | POA: Insufficient documentation

## 2013-10-22 DIAGNOSIS — Z7901 Long term (current) use of anticoagulants: Secondary | ICD-10-CM | POA: Insufficient documentation

## 2013-10-22 DIAGNOSIS — E876 Hypokalemia: Secondary | ICD-10-CM | POA: Insufficient documentation

## 2013-10-22 DIAGNOSIS — Z9089 Acquired absence of other organs: Secondary | ICD-10-CM | POA: Insufficient documentation

## 2013-10-22 LAB — CBC
HEMATOCRIT: 38.1 % (ref 36.0–46.0)
HEMOGLOBIN: 12.1 g/dL (ref 12.0–15.0)
MCH: 25 pg — ABNORMAL LOW (ref 26.0–34.0)
MCHC: 31.8 g/dL (ref 30.0–36.0)
MCV: 78.7 fL (ref 78.0–100.0)
Platelets: 267 10*3/uL (ref 150–400)
RBC: 4.84 MIL/uL (ref 3.87–5.11)
RDW: 16.6 % — AB (ref 11.5–15.5)
WBC: 10.9 10*3/uL — AB (ref 4.0–10.5)

## 2013-10-22 LAB — BASIC METABOLIC PANEL
BUN: 13 mg/dL (ref 6–23)
CHLORIDE: 95 meq/L — AB (ref 96–112)
CO2: 33 meq/L — AB (ref 19–32)
Calcium: 8.7 mg/dL (ref 8.4–10.5)
Creatinine, Ser: 0.69 mg/dL (ref 0.50–1.10)
GFR calc non Af Amer: 86 mL/min — ABNORMAL LOW (ref >90)
Glucose, Bld: 183 mg/dL — ABNORMAL HIGH (ref 70–99)
POTASSIUM: 2.8 meq/L — AB (ref 3.7–5.3)
Sodium: 139 mEq/L (ref 137–147)

## 2013-10-22 LAB — POCT I-STAT TROPONIN I: Troponin i, poc: 0.01 ng/mL (ref 0.00–0.08)

## 2013-10-22 LAB — MAGNESIUM: Magnesium: 1.8 mg/dL (ref 1.5–2.5)

## 2013-10-22 LAB — PRO B NATRIURETIC PEPTIDE: Pro B Natriuretic peptide (BNP): 200.9 pg/mL — ABNORMAL HIGH (ref 0–125)

## 2013-10-22 MED ORDER — PREDNISONE 20 MG PO TABS: 40.0000 mg | ORAL_TABLET | Freq: Every day | ORAL | Status: DC

## 2013-10-22 MED ORDER — POTASSIUM CHLORIDE CRYS ER 20 MEQ PO TBCR
40.0000 meq | EXTENDED_RELEASE_TABLET | Freq: Once | ORAL | Status: AC
  Administered 2013-10-22: 40 meq via ORAL
  Filled 2013-10-22: qty 2

## 2013-10-22 MED ORDER — PREDNISONE 20 MG PO TABS: 60.0000 mg | ORAL_TABLET | Freq: Once | ORAL | Status: DC

## 2013-10-22 MED ORDER — LEVOFLOXACIN 500 MG PO TABS: 500.0000 mg | ORAL_TABLET | Freq: Every day | ORAL | Status: DC

## 2013-10-22 MED ORDER — DEXAMETHASONE SODIUM PHOSPHATE 10 MG/ML IJ SOLN
10.0000 mg | Freq: Once | INTRAMUSCULAR | Status: AC
  Administered 2013-10-22: 10 mg via INTRAVENOUS
  Filled 2013-10-22: qty 1

## 2013-10-22 MED ORDER — POTASSIUM CHLORIDE 10 MEQ/100ML IV SOLN
10.0000 meq | Freq: Once | INTRAVENOUS | Status: AC
  Administered 2013-10-22: 10 meq via INTRAVENOUS
  Filled 2013-10-22: qty 100

## 2013-10-22 MED ORDER — ALBUTEROL SULFATE (2.5 MG/3ML) 0.083% IN NEBU
5.0000 mg | INHALATION_SOLUTION | Freq: Once | RESPIRATORY_TRACT | Status: AC
  Administered 2013-10-22: 5 mg via RESPIRATORY_TRACT
  Filled 2013-10-22: qty 6

## 2013-10-22 MED ORDER — ALBUTEROL SULFATE (2.5 MG/3ML) 0.083% IN NEBU
INHALATION_SOLUTION | RESPIRATORY_TRACT | Status: AC
  Filled 2013-10-22: qty 6

## 2013-10-22 MED ORDER — AEROCHAMBER PLUS FLO-VU MEDIUM MISC
1.0000 | Freq: Once | Status: DC
  Filled 2013-10-22: qty 1

## 2013-10-22 MED ORDER — IPRATROPIUM BROMIDE 0.02 % IN SOLN
0.5000 mg | Freq: Once | RESPIRATORY_TRACT | Status: AC
Start: 2013-10-22 — End: 2013-10-22
  Administered 2013-10-22: 0.5 mg via RESPIRATORY_TRACT
  Filled 2013-10-22: qty 2.5

## 2013-10-22 MED ORDER — FUROSEMIDE 10 MG/ML IJ SOLN
40.0000 mg | INTRAMUSCULAR | Status: AC
  Administered 2013-10-22: 40 mg via INTRAVENOUS
  Filled 2013-10-22: qty 4

## 2013-10-22 MED ORDER — ALBUTEROL SULFATE (2.5 MG/3ML) 0.083% IN NEBU
5.0000 mg | INHALATION_SOLUTION | Freq: Once | RESPIRATORY_TRACT | Status: AC
  Administered 2013-10-22: 5 mg via RESPIRATORY_TRACT

## 2013-10-22 NOTE — ED Provider Notes (Signed)
CSN: [id number]     Arrival date & time 10/22/13  [id number] History   First MD Initiated Contact with Patient 10/22/13 1044     Chief Complaint  Patient presents with  . Shortness of Breath   (Consider location/radiation/quality/duration/timing/severity/associated sxs/prior Treatment) HPI Comments: [name] is a 73 year-old female with a past medical history of A-fib, CHF, HTN, DM, presenting the Emergency Department with a chief complaint of shortness of breath worsening since yesterday. The patient reports that she was evaluated in the ED 2 days ago for similar complaints and was prescribed a Z-pac and albuterol inhaler.  She states partial symptom improvement but she reports increase in cough and wheezing since yesterday afternoon. She reports using the inhaler yesterday and today, 5 times,  without relief.  She reports increase in sputum production.  She reports yellow productive cough.  Denies fever or chills, no palpitations, or lower extremity edema. She also complains of posttussive nausea without emesis.  The patient reports she has been non compliant with her potassium for over two days.    Patient is a 73 y.o. female presenting with shortness of breath. The history is provided by the patient and medical records. No language interpreter was used.  Shortness of Breath Associated symptoms: cough and wheezing   Associated symptoms: no abdominal pain, no chest pain, no fever and no vomiting     Past Medical History  Diagnosis Date  . Atrial fibrillation /flutter   . Hypertension     a.  Renal arterial Dopplers 12/2011: 1-59% right renal artery stenosis  . Diabetes mellitus   . Hyperlipidemia   . Gout   . 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 Diastolic CHF, chronic     a.  echo 2006 - ef 55-65%; mild diast dysfxn;    b. Echo 08/2011: Mild LVH, EF 60%.   . Arthritis   . Morbid obesity   . CHF (congestive heart failure)   . Back pain   . Chronic kidney  disease   . Obstructive sleep apnea on CPAP   . Chronic anticoagulation     due to afib   Past Surgical History  Procedure Laterality Date  . Cholecystectomy    . Total abdominal hysterectomy    . Appendectomy    . Tonsillectomy  1982  . Cardioversion  10/22/2011    Procedure: CARDIOVERSION;  Surgeon: [name], MD;  Location: [facility] OR;  Service: Cardiovascular;  Laterality: N/A;   Family History  Problem Relation Age of Onset  . Heart disease Father   . Hypertension Father   . Breast cancer Sister   . Cancer Sister     breast   History  Substance Use Topics  . Smoking status: Never Smoker   . Smokeless tobacco: Never Used  . Alcohol Use: No   OB History   Grav Para Term Preterm Abortions TAB SAB Ect Mult Living                 Review of Systems  Constitutional: Negative for fever and chills.  Respiratory: Positive for cough, chest tightness, shortness of breath and wheezing.   Cardiovascular: Negative for chest pain, palpitations and leg swelling.  Gastrointestinal: Positive for nausea. Negative for vomiting and abdominal pain.    Allergies  Review of patient's allergies indicates no known allergies.  Home Medications   Current Outpatient Rx  Name  Route  Sig  Dispense  Refill  . albuterol (PROVENTIL  HFA;VENTOLIN HFA) 108 (90 BASE) MCG/ACT inhaler   Inhalation   Inhale 1-2 puffs into the lungs every 4 (four) hours as needed for wheezing or shortness of breath.         . allopurinol (ZYLOPRIM) 300 MG tablet   Oral   Take 300 mg by mouth daily. Take one tablet  daily         . amiodarone (PACERONE) 200 MG tablet   Oral   Take 1 tablet (200 mg total) by mouth daily.   90 tablet   3   . amLODipine (NORVASC) 5 MG tablet   Oral   Take 5 mg by mouth daily.         Marland Kitchen azithromycin (ZITHROMAX) 250 MG tablet      Use as directed   6 each   0   . cloNIDine (CATAPRES) 0.2 MG tablet   Oral   Take 0.2 mg by mouth 2 (two) times daily.         .  furosemide (LASIX) 40 MG tablet   Oral   Take 40 mg by mouth daily.         Marland Kitchen levothyroxine (SYNTHROID, LEVOTHROID) 100 MCG tablet   Oral   Take 100 mcg by mouth daily before breakfast.         . linagliptin (TRADJENTA) 5 MG TABS tablet   Oral   Take 1 tablet (5 mg total) by mouth daily.   30 tablet   3   . lisinopril (PRINIVIL,ZESTRIL) 10 MG tablet   Oral   Take 1 tablet (10 mg total) by mouth daily.   30 tablet   0   . potassium chloride SA (K-DUR,KLOR-CON) 20 MEQ tablet      Take 2 tablets (40 mEq total) by mouth twice a day.   120 tablet   3   . spironolactone (ALDACTONE) 12.5 mg TABS tablet   Oral   Take 0.5 tablets (12.5 mg total) by mouth daily.   30 tablet   3   . warfarin (COUMADIN) 5 MG tablet   Oral   Take 2.5-5 mg by mouth daily. 5 mg on Saturdays and 2.5 mg all other days.         Marland Kitchen levofloxacin (LEVAQUIN) 500 MG tablet   Oral   Take 1 tablet (500 mg total) by mouth daily.   5 tablet   0   . predniSONE (DELTASONE) 20 MG tablet   Oral   Take 2 tablets (40 mg total) by mouth daily.   8 tablet   0    BP 122/97  Pulse 63  Temp(Src) 98.6 F (37 C) (Oral)  Resp 21  SpO2 97% Physical Exam  Nursing note and vitals reviewed. Constitutional: She appears well-developed and well-nourished.  HENT:  Head: Normocephalic and atraumatic.  Eyes: EOM are normal.  Neck: Neck supple.  Cardiovascular: Normal rate and regular rhythm.   Lower extremity: Trace pitting edema, equal bilaterally.  Pulmonary/Chest: Not tachypneic. She has wheezes.  Abdominal: Soft. Bowel sounds are normal. She exhibits no distension. There is no tenderness. There is no rebound.    ED Course  Procedures (including critical care time) Labs Review Labs Reviewed  BASIC METABOLIC PANEL - Abnormal; Notable for the following:    Potassium 2.8 (*)    Chloride 95 (*)    CO2 33 (*)    Glucose, Bld 183 (*)    GFR calc non Af Amer 86 (*)    All other components within  normal  limits  CBC - Abnormal; Notable for the following:    WBC 10.9 (*)    MCH 25.0 (*)    RDW 16.6 (*)    All other components within normal limits  PRO B NATRIURETIC PEPTIDE - Abnormal; Notable for the following:    Pro B Natriuretic peptide (BNP) 200.9 (*)    All other components within normal limits  MAGNESIUM  POCT I-STAT TROPONIN I   Imaging Review Dg Chest 2 View  10/22/2013   CLINICAL DATA:  Shortness of breath and wheezing.  Productive cough.  EXAM: CHEST  2 VIEW  COMPARISON:  10/20/2013 and 09/02/2013 and 04/05/2013  FINDINGS: Again noted are prominent linear or interstitial lung densities, particularly at the lung bases. There has been very mild progression at the lung bases since the recent comparison examination. No evidence for airspace disease. The heart and mediastinum are stable. Mild degenerative changes in the upper thoracic spine.  IMPRESSION: Mild progression of the interstitial lung densities. Findings may represent mild edema and/or atelectasis.   Electronically Signed   By: Markus Daft M.D.   On: 10/22/2013 11:51    EKG Interpretation   None       MDM   1. Bronchitis   2. Hypokalemia    Pt evaluated 2 days ago for similar complaints and diagnosed with Bronchitis. Patient has moderate wheezing throughout lung fields. Labs and XR-sent. Second neb ordered.  K 2.8, likely transient since drawn after her albuterol treatment will replete orally and 10 mEq given IV. BNP-trending down. Discussed patient history, condition, and labs with Dr. Wilson Singer.  After his evaluation he suggest completing her Z-pac and following up as an outpatient. Discussed lab results, imaging results, and treatment plan with the patient. Return precautions given. Reports understanding and no other concerns at this time.  Patient is stable for discharge at this time.  Meds given in ED:  Medications  albuterol (PROVENTIL) (2.5 MG/3ML) 0.083% nebulizer solution 5 mg (5 mg Nebulization Given 10/22/13 1011)   albuterol (PROVENTIL) (2.5 MG/3ML) 0.083% nebulizer solution 5 mg (5 mg Nebulization Given 10/22/13 1152)  ipratropium (ATROVENT) nebulizer solution 0.5 mg (0.5 mg Nebulization Given 10/22/13 1152)  potassium chloride SA (K-DUR,KLOR-CON) CR tablet 40 mEq (40 mEq Oral Given 10/22/13 1222)  potassium chloride 10 mEq in 100 mL IVPB (0 mEq Intravenous Stopped 10/22/13 1447)  furosemide (LASIX) injection 40 mg (40 mg Intravenous Given 10/22/13 1432)  dexamethasone (DECADRON) injection 10 mg (10 mg Intravenous Given 10/22/13 1432)    Discharge Medication List as of 10/22/2013  2:38 PM        Ander Purpura Burnetta Sabin, PA-C 10/23/13 1641

## 2013-10-22 NOTE — ED Notes (Signed)
PA at bedside.

## 2013-10-22 NOTE — ED Notes (Signed)
Pt in from home c/o continued shortness of breath and cough, pt here two days ago and dx with bronchitis, states symptoms are not worse but they have continued. Denies pain. Increased dyspnea with exertion

## 2013-10-22 NOTE — Discharge Instructions (Signed)
Call for a follow up appointment with a Family or Primary Care Provider.  Return if Symptoms worsen.   Take medication as prescribed.  The medications may increase your change of bleeding, if you experience abnormal bleeding return to the Emergency Department.

## 2013-10-27 NOTE — ED Provider Notes (Signed)
Medical screening examination/treatment/procedure(s) were conducted as a shared visit with non-physician practitioner(s) and myself.  I personally evaluated the patient during the encounter.  EKG Interpretation   None      73 year old female with shortness of breath. Doubt anginal equivalent. Suspect bronchitis. She was restarted on azithromycin. Instructed her to finish this. She has an albuterol inhaler. Units of steroids. Very low suspicion for serious bacterial illness, pulmonary embolism or other potentially emergent pathology. Return cautions were discussed. Outpatient followup otherwise.  Virgel Manifold, MD 10/27/13 [phone]

## 2013-11-04 ENCOUNTER — Other Ambulatory Visit (HOSPITAL_COMMUNITY): Payer: Self-pay | Admitting: Internal Medicine

## 2013-11-04 DIAGNOSIS — Z1231 Encounter for screening mammogram for malignant neoplasm of breast: Secondary | ICD-10-CM

## 2013-11-11 ENCOUNTER — Ambulatory Visit (INDEPENDENT_AMBULATORY_CARE_PROVIDER_SITE_OTHER): Payer: Medicare Other

## 2013-11-11 ENCOUNTER — Other Ambulatory Visit: Payer: Medicare Other

## 2013-11-11 DIAGNOSIS — Z7901 Long term (current) use of anticoagulants: Secondary | ICD-10-CM

## 2013-11-11 DIAGNOSIS — I4892 Unspecified atrial flutter: Secondary | ICD-10-CM

## 2013-11-11 DIAGNOSIS — I4891 Unspecified atrial fibrillation: Secondary | ICD-10-CM

## 2013-11-11 LAB — POCT INR: INR: 1.9

## 2013-12-02 ENCOUNTER — Ambulatory Visit (INDEPENDENT_AMBULATORY_CARE_PROVIDER_SITE_OTHER): Payer: Medicare Other | Admitting: *Deleted

## 2013-12-02 DIAGNOSIS — Z7901 Long term (current) use of anticoagulants: Secondary | ICD-10-CM

## 2013-12-02 DIAGNOSIS — I4891 Unspecified atrial fibrillation: Secondary | ICD-10-CM

## 2013-12-02 DIAGNOSIS — I4892 Unspecified atrial flutter: Secondary | ICD-10-CM

## 2013-12-02 DIAGNOSIS — Z5181 Encounter for therapeutic drug level monitoring: Secondary | ICD-10-CM

## 2013-12-02 LAB — POCT INR: INR: 1.7

## 2013-12-16 ENCOUNTER — Other Ambulatory Visit: Payer: Self-pay | Admitting: *Deleted

## 2013-12-16 MED ORDER — WARFARIN SODIUM 5 MG PO TABS: ORAL_TABLET | ORAL | Status: DC

## 2013-12-16 NOTE — Telephone Encounter (Signed)
Pt informed that will reorder refill on coumadin now

## 2013-12-20 ENCOUNTER — Ambulatory Visit (INDEPENDENT_AMBULATORY_CARE_PROVIDER_SITE_OTHER): Payer: Medicare Other | Admitting: *Deleted

## 2013-12-20 DIAGNOSIS — Z5181 Encounter for therapeutic drug level monitoring: Secondary | ICD-10-CM

## 2013-12-20 DIAGNOSIS — I4891 Unspecified atrial fibrillation: Secondary | ICD-10-CM

## 2013-12-20 DIAGNOSIS — Z7901 Long term (current) use of anticoagulants: Secondary | ICD-10-CM

## 2013-12-20 DIAGNOSIS — I4892 Unspecified atrial flutter: Secondary | ICD-10-CM

## 2013-12-20 LAB — POCT INR: INR: 1.8

## 2014-01-03 ENCOUNTER — Ambulatory Visit (INDEPENDENT_AMBULATORY_CARE_PROVIDER_SITE_OTHER): Payer: Medicare Other

## 2014-01-03 DIAGNOSIS — I4891 Unspecified atrial fibrillation: Secondary | ICD-10-CM

## 2014-01-03 DIAGNOSIS — I4892 Unspecified atrial flutter: Secondary | ICD-10-CM

## 2014-01-03 DIAGNOSIS — Z7901 Long term (current) use of anticoagulants: Secondary | ICD-10-CM

## 2014-01-03 DIAGNOSIS — Z5181 Encounter for therapeutic drug level monitoring: Secondary | ICD-10-CM

## 2014-01-03 LAB — POCT INR: INR: 2.1

## 2014-01-27 ENCOUNTER — Ambulatory Visit (INDEPENDENT_AMBULATORY_CARE_PROVIDER_SITE_OTHER): Payer: Medicare Other | Admitting: Pharmacist

## 2014-01-27 DIAGNOSIS — I4891 Unspecified atrial fibrillation: Secondary | ICD-10-CM

## 2014-01-27 DIAGNOSIS — I4892 Unspecified atrial flutter: Secondary | ICD-10-CM

## 2014-01-27 DIAGNOSIS — Z7901 Long term (current) use of anticoagulants: Secondary | ICD-10-CM

## 2014-01-27 DIAGNOSIS — Z5181 Encounter for therapeutic drug level monitoring: Secondary | ICD-10-CM

## 2014-01-27 LAB — POCT INR: INR: 2

## 2014-02-24 ENCOUNTER — Ambulatory Visit (INDEPENDENT_AMBULATORY_CARE_PROVIDER_SITE_OTHER): Payer: Medicare Other | Admitting: *Deleted

## 2014-02-24 DIAGNOSIS — I4891 Unspecified atrial fibrillation: Secondary | ICD-10-CM

## 2014-02-24 DIAGNOSIS — Z5181 Encounter for therapeutic drug level monitoring: Secondary | ICD-10-CM

## 2014-02-24 DIAGNOSIS — I4892 Unspecified atrial flutter: Secondary | ICD-10-CM

## 2014-02-24 DIAGNOSIS — Z7901 Long term (current) use of anticoagulants: Secondary | ICD-10-CM

## 2014-02-24 LAB — POCT INR: INR: 2.6

## 2014-03-14 ENCOUNTER — Telehealth: Payer: Self-pay | Admitting: Internal Medicine

## 2014-03-14 NOTE — Telephone Encounter (Signed)
New message     [address] Sunday and have a huge bruise on knee---pt is on coumadin.  She want someone to call her from the coumadin clinic

## 2014-03-14 NOTE — Telephone Encounter (Signed)
Telephoned pt back about fall, she states he hasn't called her PCP. I instructed her to call to see if they want to order any test or assess the knee. Told her to apply ice PRN until see talks with them.

## 2014-03-24 ENCOUNTER — Ambulatory Visit (INDEPENDENT_AMBULATORY_CARE_PROVIDER_SITE_OTHER): Payer: Medicare Other | Admitting: *Deleted

## 2014-03-24 DIAGNOSIS — I4891 Unspecified atrial fibrillation: Secondary | ICD-10-CM

## 2014-03-24 DIAGNOSIS — Z5181 Encounter for therapeutic drug level monitoring: Secondary | ICD-10-CM

## 2014-03-24 DIAGNOSIS — I4892 Unspecified atrial flutter: Secondary | ICD-10-CM

## 2014-03-24 DIAGNOSIS — Z7901 Long term (current) use of anticoagulants: Secondary | ICD-10-CM

## 2014-03-24 LAB — POCT INR: INR: 1.6

## 2014-04-07 ENCOUNTER — Ambulatory Visit (INDEPENDENT_AMBULATORY_CARE_PROVIDER_SITE_OTHER): Payer: Medicare Other | Admitting: *Deleted

## 2014-04-07 DIAGNOSIS — Z7901 Long term (current) use of anticoagulants: Secondary | ICD-10-CM

## 2014-04-07 DIAGNOSIS — I4891 Unspecified atrial fibrillation: Secondary | ICD-10-CM

## 2014-04-07 DIAGNOSIS — Z5181 Encounter for therapeutic drug level monitoring: Secondary | ICD-10-CM

## 2014-04-07 DIAGNOSIS — I4892 Unspecified atrial flutter: Secondary | ICD-10-CM

## 2014-04-07 LAB — POCT INR: INR: 1.9

## 2014-04-26 ENCOUNTER — Ambulatory Visit (HOSPITAL_COMMUNITY)
Admission: RE | Admit: 2014-04-26 | Discharge: 2014-04-26 | Disposition: A | Discharge status: Home / Self Care | Payer: Medicare Other | Source: Ambulatory Visit | Attending: Internal Medicine | Admitting: Internal Medicine

## 2014-04-26 DIAGNOSIS — Z1231 Encounter for screening mammogram for malignant neoplasm of breast: Secondary | ICD-10-CM | POA: Insufficient documentation

## 2014-04-28 ENCOUNTER — Ambulatory Visit (INDEPENDENT_AMBULATORY_CARE_PROVIDER_SITE_OTHER): Payer: Medicare Other | Admitting: Pharmacist

## 2014-04-28 DIAGNOSIS — Z5181 Encounter for therapeutic drug level monitoring: Secondary | ICD-10-CM

## 2014-04-28 DIAGNOSIS — Z7901 Long term (current) use of anticoagulants: Secondary | ICD-10-CM

## 2014-04-28 DIAGNOSIS — I4892 Unspecified atrial flutter: Secondary | ICD-10-CM

## 2014-04-28 DIAGNOSIS — I4891 Unspecified atrial fibrillation: Secondary | ICD-10-CM

## 2014-04-28 LAB — POCT INR: INR: 3.2

## 2014-05-12 ENCOUNTER — Telehealth: Payer: Self-pay

## 2014-05-12 NOTE — Telephone Encounter (Signed)
Pharmacy called to let us know that the patient 's levothyroxine (SYNTHROID, LEVOTHROID) 100 MCG tablet had increase to 112 mg

## 2014-05-15 ENCOUNTER — Ambulatory Visit (INDEPENDENT_AMBULATORY_CARE_PROVIDER_SITE_OTHER): Payer: Medicare Other

## 2014-05-15 DIAGNOSIS — I4892 Unspecified atrial flutter: Secondary | ICD-10-CM

## 2014-05-15 DIAGNOSIS — I4891 Unspecified atrial fibrillation: Secondary | ICD-10-CM

## 2014-05-15 DIAGNOSIS — Z5181 Encounter for therapeutic drug level monitoring: Secondary | ICD-10-CM

## 2014-05-15 DIAGNOSIS — Z7901 Long term (current) use of anticoagulants: Secondary | ICD-10-CM

## 2014-05-15 LAB — POCT INR: INR: 2.5

## 2014-05-15 NOTE — Telephone Encounter (Signed)
Pt had appt today to check INR after dose change.

## 2014-05-16 ENCOUNTER — Encounter: Payer: Self-pay | Admitting: Pulmonary Disease

## 2014-06-05 ENCOUNTER — Ambulatory Visit (INDEPENDENT_AMBULATORY_CARE_PROVIDER_SITE_OTHER): Payer: Medicare Other

## 2014-06-05 DIAGNOSIS — Z7901 Long term (current) use of anticoagulants: Secondary | ICD-10-CM

## 2014-06-05 DIAGNOSIS — I4892 Unspecified atrial flutter: Secondary | ICD-10-CM

## 2014-06-05 DIAGNOSIS — I4891 Unspecified atrial fibrillation: Secondary | ICD-10-CM

## 2014-06-05 DIAGNOSIS — Z5181 Encounter for therapeutic drug level monitoring: Secondary | ICD-10-CM

## 2014-06-05 LAB — POCT INR: INR: 2.6

## 2014-07-03 ENCOUNTER — Ambulatory Visit (INDEPENDENT_AMBULATORY_CARE_PROVIDER_SITE_OTHER): Payer: Medicare Other | Admitting: *Deleted

## 2014-07-03 DIAGNOSIS — Z7901 Long term (current) use of anticoagulants: Secondary | ICD-10-CM

## 2014-07-03 DIAGNOSIS — I4892 Unspecified atrial flutter: Secondary | ICD-10-CM

## 2014-07-03 DIAGNOSIS — I4891 Unspecified atrial fibrillation: Secondary | ICD-10-CM

## 2014-07-03 DIAGNOSIS — Z5181 Encounter for therapeutic drug level monitoring: Secondary | ICD-10-CM

## 2014-07-03 LAB — POCT INR: INR: 2.6

## 2014-07-13 ENCOUNTER — Ambulatory Visit: Payer: Medicare Other | Admitting: Pulmonary Disease

## 2014-07-25 ENCOUNTER — Telehealth: Payer: Self-pay | Admitting: Internal Medicine

## 2014-07-25 NOTE — Telephone Encounter (Signed)
I left a message for Colletta Maryland, RN with [facility] and updated her on Dr. Olin Pia orders for the patient for the next 3 days.

## 2014-07-25 NOTE — Telephone Encounter (Signed)
Labs received from Dr. Ronnell Freshwater office. On 07/05/14- K+ 4.3/ BUN- 14/ creatinine- 0.50. I reviewed the patient's weight and symptoms with Dr. Caryl Comes. The patient currently takes 40 mg once daily. Orders received to take an extra 1/2 pill (20 mg) x 3 days. The patient is aware and verbalizes understanding.

## 2014-07-25 NOTE — Telephone Encounter (Signed)
New message           Nurse from [facility] calling on behalf of pt to report a weight gain of more than 5 pounds over the last 8 days / pt has developed a cough yesterday and is more sob with more activity / swelling in left ankle more than the right ankle / pt is not sob right now only when she is in activity

## 2014-07-25 NOTE — Telephone Encounter (Signed)
I called and spoke with the patient to confirm what is going on with her. She states that her weight will typically run ~237 lbs. This morning she was 242 lbs. She does note some swelling to her feet and complains of a cough last night with SOB. This is a little better this morning. She also reports feeling constipated. Her last bowel movement was yesterday, but was very hard. Her urination is the same as what she usually has. She reports minimal changes to the amount of salt in her diet, but does report she has eaten some cheese and a little bit of ham recently. This could possibly be contributing to her weight gain. She has not had her lasix this morning, but states she takes this after she eats lunch. I advised her that I was going to review with Dr. Caryl Comes after obtaining most recent BMP results from Dr. Ronnell Freshwater office. I will call her back after speaking with Dr. Caryl Comes. Alvis Lemmings, RN, BSN  I attempted to call Dr. Ronnell Freshwater office [phone]). They are closed for lunch from 12-1 pm. I will call back after 1 pm. Alvis Lemmings, RN, BSN

## 2014-08-01 ENCOUNTER — Ambulatory Visit (INDEPENDENT_AMBULATORY_CARE_PROVIDER_SITE_OTHER): Payer: Medicare Other | Admitting: *Deleted

## 2014-08-01 DIAGNOSIS — I4891 Unspecified atrial fibrillation: Secondary | ICD-10-CM

## 2014-08-01 DIAGNOSIS — I4892 Unspecified atrial flutter: Secondary | ICD-10-CM

## 2014-08-01 DIAGNOSIS — Z5181 Encounter for therapeutic drug level monitoring: Secondary | ICD-10-CM

## 2014-08-01 DIAGNOSIS — Z7901 Long term (current) use of anticoagulants: Secondary | ICD-10-CM

## 2014-08-01 LAB — POCT INR: INR: 2

## 2014-08-31 ENCOUNTER — Ambulatory Visit (INDEPENDENT_AMBULATORY_CARE_PROVIDER_SITE_OTHER): Payer: Medicare Other | Admitting: *Deleted

## 2014-08-31 ENCOUNTER — Ambulatory Visit (INDEPENDENT_AMBULATORY_CARE_PROVIDER_SITE_OTHER): Payer: Medicare Other | Admitting: Nurse Practitioner

## 2014-08-31 ENCOUNTER — Telehealth: Payer: Self-pay | Admitting: Internal Medicine

## 2014-08-31 ENCOUNTER — Encounter: Payer: Self-pay | Admitting: Nurse Practitioner

## 2014-08-31 VITALS — BP 128/60 | HR 70 | Ht 61.0 in | Wt 245.0 lb

## 2014-08-31 DIAGNOSIS — I4892 Unspecified atrial flutter: Secondary | ICD-10-CM

## 2014-08-31 DIAGNOSIS — Z5181 Encounter for therapeutic drug level monitoring: Secondary | ICD-10-CM

## 2014-08-31 DIAGNOSIS — I480 Paroxysmal atrial fibrillation: Secondary | ICD-10-CM

## 2014-08-31 DIAGNOSIS — Z7901 Long term (current) use of anticoagulants: Secondary | ICD-10-CM

## 2014-08-31 DIAGNOSIS — I4891 Unspecified atrial fibrillation: Secondary | ICD-10-CM

## 2014-08-31 DIAGNOSIS — I10 Essential (primary) hypertension: Secondary | ICD-10-CM

## 2014-08-31 DIAGNOSIS — I5031 Acute diastolic (congestive) heart failure: Secondary | ICD-10-CM

## 2014-08-31 LAB — BASIC METABOLIC PANEL
BUN: 12 mg/dL (ref 6–23)
CHLORIDE: 102 meq/L (ref 96–112)
CO2: 31 meq/L (ref 19–32)
CREATININE: 0.6 mg/dL (ref 0.4–1.2)
Calcium: 8.9 mg/dL (ref 8.4–10.5)
GFR: 100.88 mL/min (ref >60.00)
Glucose, Bld: 148 mg/dL — ABNORMAL HIGH (ref 70–99)
Potassium: 3.7 mEq/L (ref 3.5–5.1)
Sodium: 141 mEq/L (ref 135–145)

## 2014-08-31 LAB — CBC
HCT: 36.7 % (ref 36.0–46.0)
HEMOGLOBIN: 11.7 g/dL — AB (ref 12.0–15.0)
MCHC: 31.8 g/dL (ref 30.0–36.0)
MCV: 76.9 fl — ABNORMAL LOW (ref 78.0–100.0)
Platelets: 396 10*3/uL (ref 150.0–400.0)
RBC: 4.77 Mil/uL (ref 3.87–5.11)
RDW: 16.5 % — ABNORMAL HIGH (ref 11.5–15.5)
WBC: 12 10*3/uL — AB (ref 4.0–10.5)

## 2014-08-31 LAB — POCT INR: INR: 2

## 2014-08-31 NOTE — Patient Instructions (Signed)
Your physician has recommended you make the following change in your medication:  FOR Thursday THRU Sunday---TAKE LASIX 40 MG TWICE A DAY, then go back to daily Your physician recommends that you return for lab work in: today (cbc, bmet) Your physician has requested that you have an echocardiogram. Echocardiography is a painless test that uses sound waves to create images of your heart. It provides your doctor with information about the size and shape of your heart and how well your heart's chambers and valves are working. This procedure takes approximately one hour. There are no restrictions for this procedure.  Your physician recommends that you schedule a follow-up appointment on Tuesday 11/17 at 8:30 am with Ignacia Bayley, NP

## 2014-08-31 NOTE — Telephone Encounter (Signed)
New Message  Colletta Maryland a  RN with [employer] called reports that the pt has noticed more swelling than normal.. and sob while walking. Pt is taking furosemide. Reports her PCP has increased furosemide from 40-60 mg from monday 11/9 through today 08/31/2014 ---PCP is closed.. What should the pt do moving forward. Nurse will fax documents as well and she reports that the pt has has a 4.8 lb wt gain over the past 8 days.

## 2014-08-31 NOTE — Telephone Encounter (Signed)
Per note from [facility] nurse, patient is SOB on ambulation and has gained 4,8 lbs in the past week. Is spoke with her and told her that Ignacia Bayley PA would see her today at 2:30 pm.

## 2014-08-31 NOTE — Progress Notes (Signed)
Patient Name: [name] Date of Encounter: 08/31/2014  Primary Care Provider:  Marijean Bravo, MD Primary Cardiologist:  Olin Pia, MD   Patient Profile  73 y/o female with h/o PAF/Fl and DCHF, who presents with a 4 day h/o LEE and DOE.  Problem List   Past Medical History  Diagnosis Date  . Hypertension     a.  Renal arterial Dopplers 12/2011: 1-59% right renal artery stenosis  . Diabetes mellitus   . Hyperlipidemia   . Gout   . Paroxysmal Afib/Flutter     a. 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  . Diastolic CHF, chronic     a.  echo 2006 - ef 55-65%; mild diast dysfxn;    b. Echo 08/2011: Mild LVH, EF 60%;  c. 04/2013 Echo: EF 65-69%, mild conc LVH.  Marland Kitchen Arthritis   . Morbid obesity   . Back pain   . Chronic kidney disease   . Obstructive sleep apnea on CPAP   . Chronic anticoagulation     due to aflutter   Past Surgical History  Procedure Laterality Date  . Cholecystectomy    . Total abdominal hysterectomy    . Appendectomy    . Tonsillectomy  1982  . Cardioversion  10/22/2011    Procedure: CARDIOVERSION;  Surgeon: [name], MD;  Location: [facility] OR;  Service: Cardiovascular;  Laterality: N/A;    Allergies  No Known Allergies  HPI  73 y/o female with a h/o PAF/Flutter on chronic coumadin.  She also has a h/o diast CHF with nl LV fxn by echo in 04/2013.  She generally does pretty well and has been intentionally losing wt.  Beginning on Monday however, she began to note increasing lower ext edema.  She saw her PCP and was advised to increase her lasix to 60mg  daily.  She did so, with improved output but overall, her wt is still up 7 lbs in the last week.  She generally watches salt intake closely however she did eat out at the [employer] on Tuesday night.    This AM, she noted DOE while walking in the parking lot and this is a new symptom for her.  She called the office and was scheduled to see me.  She denies chest pain, palpitations, pnd, orthopnea, n, v,  dizziness, syncope, weight gain, or early satiety.   Home Medications  Prior to Admission medications   Medication Sig Start Date End Date Taking? Authorizing Provider  albuterol (PROVENTIL HFA;VENTOLIN HFA) 108 (90 BASE) MCG/ACT inhaler Inhale 1-2 puffs into the lungs every 4 (four) hours as needed for wheezing or shortness of breath.   Yes Historical Provider, MD  allopurinol (ZYLOPRIM) 300 MG tablet Take 300 mg by mouth daily. Take one tablet  daily 07/07/13  Yes Historical Provider, MD  amiodarone (PACERONE) 200 MG tablet Take 1 tablet (200 mg total) by mouth daily. 10/05/13  Yes [name] P [name], MD  amLODipine (NORVASC) 5 MG tablet Take 5 mg by mouth daily. 09/14/13  Yes Historical Provider, MD  cloNIDine (CATAPRES) 0.2 MG tablet Take 0.2 mg by mouth 2 (two) times daily.   Yes Historical Provider, MD  CRESTOR 10 MG tablet  08/17/14  Yes Historical Provider, MD  furosemide (LASIX) 40 MG tablet Take 40 mg by mouth daily. Pt been taking 60 mg daily   Yes Historical Provider, MD  levothyroxine (SYNTHROID, LEVOTHROID) 125 MCG tablet Take 125 mcg by mouth daily before breakfast.   Yes Historical Provider, MD  linagliptin (TRADJENTA) 5 MG TABS tablet Take 1 tablet (5 mg total) by mouth daily. 10/05/13  Yes [name] P [name], MD  lisinopril (PRINIVIL,ZESTRIL) 10 MG tablet Take 1 tablet (10 mg total) by mouth daily. 04/07/13  Yes [name], MD  potassium chloride SA (K-DUR,KLOR-CON) 20 MEQ tablet Take 2 tablets (40 mEq total) by mouth twice a day. 08/26/13  Yes Lelon Perla, MD  TOBRADEX ophthalmic ointment  06/15/14  Yes Historical Provider, MD  warfarin (COUMADIN) 5 MG tablet Take as directed by coumadin clinic 12/16/13  Yes [name], MD    Review of Systems  LEE and DOE as outlined above.  She denies chest pain, palpitations, pnd, orthopnea, n, v, dizziness, syncope, weight gain, or early satiety.  All other systems reviewed and are otherwise negative except as noted  above.  Physical Exam  Blood pressure 128/60, pulse 70, height 5\' 1"  (1.549 m), weight 245 lb (111.131 kg), SpO2 98 %.  General: Pleasant, NAD Psych: Normal affect. Neuro: Alert and oriented X 3. Moves all extremities spontaneously. HEENT: Normal  Neck: Supple without bruits or JVD. Lungs:  Resp regular and unlabored, CTA. Heart: RRR no s3, s4, 2/6 SEM RUSB->throughout. Abdomen: Soft, non-tender, non-distended, BS + x 4.  Extremities: No clubbing, cyanosis. 1+ bilat LE edema. DP/PT/Radials 2+ and equal bilaterally.  Accessory Clinical Findings  ECG - RSR, 70, no acute st/t changes.  Assessment & Plan  1.  Acute on chronic diastolic chf:  Pt presents with a 7 lb wt gain in the last week.  She has had LEE and now DOE.  She has mild volume overload on exam today.  I've recommended that she increase her lasix to 40mg  BID over the next 4 days and we will arrange for f/u echo to re-eval LV and valvular fxn along with clinic f/u early next week.  HR/BP are well controlled.  Cont current antihypertensives.  2.  PAF/Flutter:  In sinus.  Cont current meds and anticoagulation.  3.  HTN:  Stable.  4.  Dispo:  F/U echo and early clinic f/u next wk.    Murray Hodgkins, NP 08/31/2014, 5:07 PM

## 2014-09-01 ENCOUNTER — Ambulatory Visit (HOSPITAL_COMMUNITY): Payer: Medicare Other | Attending: Cardiovascular Disease

## 2014-09-01 DIAGNOSIS — I10 Essential (primary) hypertension: Secondary | ICD-10-CM | POA: Insufficient documentation

## 2014-09-01 DIAGNOSIS — E119 Type 2 diabetes mellitus without complications: Secondary | ICD-10-CM | POA: Insufficient documentation

## 2014-09-01 DIAGNOSIS — E785 Hyperlipidemia, unspecified: Secondary | ICD-10-CM | POA: Diagnosis not present

## 2014-09-01 DIAGNOSIS — I5031 Acute diastolic (congestive) heart failure: Secondary | ICD-10-CM | POA: Insufficient documentation

## 2014-09-01 NOTE — Progress Notes (Signed)
2D Echo completed. 09/01/2014

## 2014-09-05 ENCOUNTER — Ambulatory Visit (INDEPENDENT_AMBULATORY_CARE_PROVIDER_SITE_OTHER): Payer: Medicare Other | Admitting: Nurse Practitioner

## 2014-09-05 ENCOUNTER — Encounter: Payer: Self-pay | Admitting: Nurse Practitioner

## 2014-09-05 VITALS — BP 160/74 | HR 68 | Ht 61.0 in | Wt 246.0 lb

## 2014-09-05 DIAGNOSIS — I5032 Chronic diastolic (congestive) heart failure: Secondary | ICD-10-CM

## 2014-09-05 DIAGNOSIS — I10 Essential (primary) hypertension: Secondary | ICD-10-CM

## 2014-09-05 DIAGNOSIS — I480 Paroxysmal atrial fibrillation: Secondary | ICD-10-CM

## 2014-09-05 NOTE — Progress Notes (Signed)
Patient Name: [name] Date of Encounter: 09/05/2014  Primary Care Provider:  Marijean Bravo, MD Primary Cardiologist:  Olin Pia, MD   Patient Profile  73 year old female who presents for follow-up related to recent volume overload.  Problem List   Past Medical History  Diagnosis Date  . Hypertension     a.  Renal arterial Dopplers 12/2011: 1-59% right renal artery stenosis  . Diabetes mellitus   . Hyperlipidemia   . Gout   . Paroxysmal Afib/Flutter     a. 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  . Diastolic CHF, chronic     a.  echo 2006 - ef 55-65%; mild diast dysfxn;    b. Echo 08/2011: Mild LVH, EF 60%;  c. 04/2013 Echo: EF 65-69%, mild conc LVH;  08/2014 Echo: EF 60-65%, mild-mod MR.  . Arthritis   . Morbid obesity   . Back pain   . Chronic kidney disease   . Obstructive sleep apnea on CPAP   . Chronic anticoagulation     due to aflutter   Past Surgical History  Procedure Laterality Date  . Cholecystectomy    . Total abdominal hysterectomy    . Appendectomy    . Tonsillectomy  1982  . Cardioversion  10/22/2011    Procedure: CARDIOVERSION;  Surgeon: [name], MD;  Location: [facility] OR;  Service: Cardiovascular;  Laterality: N/A;    Allergies  No Known Allergies  HPI  73 year old female with the above problem list. I saw her last week secondary to weight gain, dyspnea on exertion, and lower extremity swelling in the setting of having eaten a very salty soup at the [employer] a few nights prior. I increased her Lasix to 40 mg twice a day for 4 days and then dropped it back to 40 mg daily. I also repeated echocardiogram showed normal LV function with mild to moderate mitral regurgitation. Patient reports that following increased Lasix dose, her weight at home came down about 4 pounds and she had resolution of lower extremity swelling and dyspnea on exertion. She denies PND, orthopnea, dizziness, syncope, or early satiety.  Home Medications  Prior to  Admission medications   Medication Sig Start Date End Date Taking? Authorizing Provider  albuterol (PROVENTIL HFA;VENTOLIN HFA) 108 (90 BASE) MCG/ACT inhaler Inhale 1-2 puffs into the lungs every 4 (four) hours as needed for wheezing or shortness of breath.   Yes Historical Provider, MD  allopurinol (ZYLOPRIM) 300 MG tablet Take 300 mg by mouth daily. Take one tablet  daily 07/07/13  Yes Historical Provider, MD  amiodarone (PACERONE) 200 MG tablet Take 1 tablet (200 mg total) by mouth daily. 10/05/13  Yes [name] P [name], MD  amLODipine (NORVASC) 5 MG tablet Take 5 mg by mouth daily. 09/14/13  Yes Historical Provider, MD  cloNIDine (CATAPRES) 0.2 MG tablet Take 0.2 mg by mouth 2 (two) times daily.   Yes Historical Provider, MD  CRESTOR 10 MG tablet  08/17/14  Yes Historical Provider, MD  furosemide (LASIX) 40 MG tablet Take 40 mg by mouth daily. Pt been taking 60 mg daily   Yes Historical Provider, MD  levothyroxine (SYNTHROID, LEVOTHROID) 125 MCG tablet Take 125 mcg by mouth daily before breakfast.   Yes Historical Provider, MD  linagliptin (TRADJENTA) 5 MG TABS tablet Take 1 tablet (5 mg total) by mouth daily. 10/05/13  Yes [name] P [name], MD  lisinopril (PRINIVIL,ZESTRIL) 10 MG tablet Take 1 tablet (10 mg total) by mouth daily. 04/07/13  Yes Prashant K  Candiss Norse, MD  potassium chloride SA (K-DUR,KLOR-CON) 20 MEQ tablet Take 2 tablets (40 mEq total) by mouth twice a day. 08/26/13  Yes Lelon Perla, MD  warfarin (COUMADIN) 5 MG tablet Take as directed by coumadin clinic 12/16/13  Yes [name], MD    Review of Systems  She denies chest pain, palpitations, dyspnea, pnd, orthopnea, n, v, dizziness, syncope, edema, weight gain, or early satiety.  All other systems reviewed and are otherwise negative except as noted above.  Physical Exam  Blood pressure 160/74, pulse 68, height 5\' 1"  (1.549 m), weight 246 lb (111.585 kg), SpO2 96 %.  General: Pleasant, NAD Psych: Normal affect. Neuro: Alert  and oriented X 3. Moves all extremities spontaneously. HEENT: Normal  Neck: Supple without bruits or JVD. Lungs:  Resp regular and unlabored, CTA. Heart: RRR no s3, s4.  2/6 SEM throughout. Abdomen: Soft, non-tender, non-distended, BS + x 4.  Extremities: No clubbing, cyanosis.  Trace bilat ankle edema. DP/PT/Radials 2+ and equal bilaterally.  Assessment & Plan  1.  Acute on chronic diastolic congestive heart failure: Patient without weight gain after [place]. She responded very well to increased dose of Lasix and is back down to 40 mg daily. Her weight has been stable at home over the past few days and she's feeling much better. Echo showed continued normal LV function with mild to moderate mitral regurgitation. Blood pressure is elevated today though she notes it's been running better at home. Continue current medical regimen.  2. Hypertension: As above, blood pressure is elevated today. She'll continue to follow at home and notify us if it trends this way persistently. We could increase her amlodipine if necessary.  3. Paroxysmal atrial fibrillation/flutter: She is regular on exam. Continue current medications and anticoagulation.   4. Disposition: Follow-up with Dr. Caryl Comes in 3 months.    Murray Hodgkins, NP 09/05/2014, 6:05 PM

## 2014-09-05 NOTE — Patient Instructions (Signed)
Your physician recommends that you continue on your current medications as directed. Please refer to the Current Medication list given to you today.   Your physician recommends that you keep your scheduled  follow-up appointment with Dr. Caryl Comes 12/08/14 @ 10:30 am

## 2014-09-07 ENCOUNTER — Telehealth: Payer: Self-pay | Admitting: Internal Medicine

## 2014-09-07 NOTE — Telephone Encounter (Signed)
Spoke with patient and explained results of echo [name], [facility] reviewed with her) (Then LPN called her and confused her with results she explained). Patient much more understanding of what regurgitation means now. Forwarding echo results to her PCP per her request.

## 2014-09-07 NOTE — Telephone Encounter (Signed)
New Msg   Patient calling to ask questions about EKG please call at [phone].

## 2014-09-27 ENCOUNTER — Encounter (HOSPITAL_COMMUNITY): Payer: Self-pay | Admitting: Internal Medicine

## 2014-09-29 ENCOUNTER — Ambulatory Visit (INDEPENDENT_AMBULATORY_CARE_PROVIDER_SITE_OTHER): Payer: Medicare Other | Admitting: *Deleted

## 2014-09-29 DIAGNOSIS — I4891 Unspecified atrial fibrillation: Secondary | ICD-10-CM

## 2014-09-29 DIAGNOSIS — Z7901 Long term (current) use of anticoagulants: Secondary | ICD-10-CM

## 2014-09-29 DIAGNOSIS — I4892 Unspecified atrial flutter: Secondary | ICD-10-CM

## 2014-09-29 DIAGNOSIS — Z5181 Encounter for therapeutic drug level monitoring: Secondary | ICD-10-CM

## 2014-09-29 LAB — POCT INR: INR: 1.7

## 2014-10-16 ENCOUNTER — Emergency Department (HOSPITAL_COMMUNITY)
Admission: EM | Admit: 2014-10-16 | Discharge: 2014-10-16 | Disposition: A | Discharge status: Home / Self Care | Payer: Medicare Other | Attending: Emergency Medicine | Admitting: Emergency Medicine

## 2014-10-16 ENCOUNTER — Encounter (HOSPITAL_COMMUNITY): Payer: Self-pay | Admitting: *Deleted

## 2014-10-16 ENCOUNTER — Emergency Department (HOSPITAL_COMMUNITY): Payer: Medicare Other

## 2014-10-16 DIAGNOSIS — Z7951 Long term (current) use of inhaled steroids: Secondary | ICD-10-CM | POA: Diagnosis not present

## 2014-10-16 DIAGNOSIS — I480 Paroxysmal atrial fibrillation: Secondary | ICD-10-CM | POA: Insufficient documentation

## 2014-10-16 DIAGNOSIS — Z9981 Dependence on supplemental oxygen: Secondary | ICD-10-CM | POA: Insufficient documentation

## 2014-10-16 DIAGNOSIS — E119 Type 2 diabetes mellitus without complications: Secondary | ICD-10-CM | POA: Diagnosis not present

## 2014-10-16 DIAGNOSIS — Z7901 Long term (current) use of anticoagulants: Secondary | ICD-10-CM | POA: Insufficient documentation

## 2014-10-16 DIAGNOSIS — R1110 Vomiting, unspecified: Secondary | ICD-10-CM | POA: Diagnosis not present

## 2014-10-16 DIAGNOSIS — I129 Hypertensive chronic kidney disease with stage 1 through stage 4 chronic kidney disease, or unspecified chronic kidney disease: Secondary | ICD-10-CM | POA: Diagnosis not present

## 2014-10-16 DIAGNOSIS — N189 Chronic kidney disease, unspecified: Secondary | ICD-10-CM | POA: Insufficient documentation

## 2014-10-16 DIAGNOSIS — Z79899 Other long term (current) drug therapy: Secondary | ICD-10-CM | POA: Insufficient documentation

## 2014-10-16 DIAGNOSIS — R197 Diarrhea, unspecified: Secondary | ICD-10-CM | POA: Insufficient documentation

## 2014-10-16 DIAGNOSIS — E876 Hypokalemia: Secondary | ICD-10-CM

## 2014-10-16 DIAGNOSIS — G4733 Obstructive sleep apnea (adult) (pediatric): Secondary | ICD-10-CM | POA: Insufficient documentation

## 2014-10-16 DIAGNOSIS — M1990 Unspecified osteoarthritis, unspecified site: Secondary | ICD-10-CM | POA: Insufficient documentation

## 2014-10-16 DIAGNOSIS — R109 Unspecified abdominal pain: Secondary | ICD-10-CM | POA: Insufficient documentation

## 2014-10-16 DIAGNOSIS — I5032 Chronic diastolic (congestive) heart failure: Secondary | ICD-10-CM | POA: Insufficient documentation

## 2014-10-16 DIAGNOSIS — M109 Gout, unspecified: Secondary | ICD-10-CM | POA: Diagnosis not present

## 2014-10-16 LAB — COMPREHENSIVE METABOLIC PANEL
ALT: 44 U/L — ABNORMAL HIGH (ref 0–35)
ANION GAP: 11 (ref 5–15)
AST: 78 U/L — ABNORMAL HIGH (ref 0–37)
Albumin: 3.3 g/dL — ABNORMAL LOW (ref 3.5–5.2)
Alkaline Phosphatase: 160 U/L — ABNORMAL HIGH (ref 39–117)
BILIRUBIN TOTAL: 0.7 mg/dL (ref 0.3–1.2)
BUN: 12 mg/dL (ref 6–23)
CALCIUM: 8.6 mg/dL (ref 8.4–10.5)
CO2: 25 mmol/L (ref 19–32)
CREATININE: 0.72 mg/dL (ref 0.50–1.10)
Chloride: 100 mEq/L (ref 96–112)
GFR calc non Af Amer: 84 mL/min — ABNORMAL LOW (ref >90)
Glucose, Bld: 187 mg/dL — ABNORMAL HIGH (ref 70–99)
Potassium: 2.8 mmol/L — ABNORMAL LOW (ref 3.5–5.1)
Sodium: 136 mmol/L (ref 135–145)
Total Protein: 7.2 g/dL (ref 6.0–8.3)

## 2014-10-16 LAB — CBC WITH DIFFERENTIAL/PLATELET
Basophils Absolute: 0 10*3/uL (ref 0.0–0.1)
Basophils Relative: 0 % (ref 0–1)
Eosinophils Absolute: 0.3 10*3/uL (ref 0.0–0.7)
Eosinophils Relative: 2 % (ref 0–5)
HEMATOCRIT: 38.1 % (ref 36.0–46.0)
HEMOGLOBIN: 11.7 g/dL — AB (ref 12.0–15.0)
LYMPHS PCT: 16 % (ref 12–46)
Lymphs Abs: 1.8 10*3/uL (ref 0.7–4.0)
MCH: 23.7 pg — ABNORMAL LOW (ref 26.0–34.0)
MCHC: 30.7 g/dL (ref 30.0–36.0)
MCV: 77.3 fL — AB (ref 78.0–100.0)
MONO ABS: 1.4 10*3/uL — AB (ref 0.1–1.0)
MONOS PCT: 12 % (ref 3–12)
Neutro Abs: 7.9 10*3/uL — ABNORMAL HIGH (ref 1.7–7.7)
Neutrophils Relative %: 70 % (ref 43–77)
Platelets: 373 10*3/uL (ref 150–400)
RBC: 4.93 MIL/uL (ref 3.87–5.11)
RDW: 16.3 % — AB (ref 11.5–15.5)
WBC: 11.4 10*3/uL — AB (ref 4.0–10.5)

## 2014-10-16 LAB — LIPASE, BLOOD: Lipase: 31 U/L (ref 11–59)

## 2014-10-16 LAB — POTASSIUM: Potassium: 3.2 mmol/L — ABNORMAL LOW (ref 3.5–5.1)

## 2014-10-16 MED ORDER — SODIUM CHLORIDE 0.9 % IV BOLUS (SEPSIS)
500.0000 mL | Freq: Once | INTRAVENOUS | Status: AC
  Administered 2014-10-16: 500 mL via INTRAVENOUS

## 2014-10-16 MED ORDER — DICYCLOMINE HCL 20 MG PO TABS: 20.0000 mg | ORAL_TABLET | Freq: Two times a day (BID) | ORAL | Status: DC | PRN

## 2014-10-16 MED ORDER — POTASSIUM CHLORIDE 10 MEQ/100ML IV SOLN
10.0000 meq | INTRAVENOUS | Status: AC
  Administered 2014-10-16 (×2): 10 meq via INTRAVENOUS
  Filled 2014-10-16 (×2): qty 100

## 2014-10-16 MED ORDER — POTASSIUM CHLORIDE 10 MEQ/100ML IV SOLN: 10.0000 meq | INTRAVENOUS | Status: AC

## 2014-10-16 MED ORDER — POTASSIUM CHLORIDE CRYS ER 20 MEQ PO TBCR: 40.0000 meq | EXTENDED_RELEASE_TABLET | Freq: Once | ORAL | Status: DC

## 2014-10-16 MED ORDER — ONDANSETRON HCL 4 MG/2ML IJ SOLN
4.0000 mg | Freq: Once | INTRAMUSCULAR | Status: AC
  Administered 2014-10-16: 4 mg via INTRAVENOUS
  Filled 2014-10-16: qty 2

## 2014-10-16 MED ORDER — IOHEXOL 300 MG/ML  SOLN
100.0000 mL | Freq: Once | INTRAMUSCULAR | Status: AC | PRN
  Administered 2014-10-16: 100 mL via INTRAVENOUS

## 2014-10-16 NOTE — ED Notes (Signed)
The pt has had diarrhea since Friday and she had diarrhea on the way here with abd pain

## 2014-10-16 NOTE — ED Notes (Signed)
The pts 1st run of pot infused.  2nd hung

## 2014-10-16 NOTE — ED Notes (Signed)
The pt returned from c-t.  Pot run added

## 2014-10-16 NOTE — ED Provider Notes (Signed)
CSN: [id number]     Arrival date & time 10/16/14  0103 History  This chart was scribed for [name] Rice, MD by Peyton Bottoms, ED Scribe. This patient was seen in room A10C/A10C and the patient's care was started at 3:42 AM.   Chief Complaint  Patient presents with  . Diarrhea   Patient is a 73 y.o. female presenting with diarrhea. The history is provided by the patient. No language interpreter was used.  Diarrhea Quality:  Watery Severity:  Moderate Onset quality:  Gradual Number of episodes:  9 Duration:  2 days Timing:  Intermittent Progression:  Unchanged Relieved by:  Nothing Worsened by:  Nothing tried Ineffective treatments:  Anti-motility medications and liquids Associated symptoms: abdominal pain and vomiting   Associated symptoms: no chills, no fever and no headaches     HPI Comments: [name] is a 73 y.o. female who presents to the Emergency Department complaining of multiple episodes of diarrhea that began 2 days ago. Patient reports she had 7 episodes of watery diarrhea on Friday and 2 episodes of water diarrhea yesterday. She states she took 8 tablets of immodium on Friday and 2 tablets yesterday with no relief. She also reports 2 episodes of emesis that occurred yesterday. She reports associated cramping, periumbilical abdominal pain. Patient states she tried drinking water and ginger ale with no relief. She denies any dietary changes. She denies sick contacts. She denies associated hematochezia, fevers or chills.  Past Medical History  Diagnosis Date  . Hypertension     a.  Renal arterial Dopplers 12/2011: 1-59% right renal artery stenosis  . Diabetes mellitus   . Hyperlipidemia   . Gout   . Paroxysmal Afib/Flutter     a. 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  . Diastolic CHF, chronic     a.  echo 2006 - ef 55-65%; mild diast dysfxn;    b. Echo 08/2011: Mild LVH, EF 60%;  c. 04/2013 Echo: EF 65-69%, mild conc LVH;  08/2014 Echo: EF 60-65%, mild-mod MR.   . Arthritis   . Morbid obesity   . Back pain   . Chronic kidney disease   . Obstructive sleep apnea on CPAP   . Chronic anticoagulation     due to aflutter   Past Surgical History  Procedure Laterality Date  . Cholecystectomy    . Total abdominal hysterectomy    . Appendectomy    . Tonsillectomy  1982  . Cardioversion  10/22/2011    Procedure: CARDIOVERSION;  Surgeon: [name], MD;  Location: [place];  Service: Cardiovascular;  Laterality: N/A;  . Cardioversion N/A 09/10/2011    Procedure: CARDIOVERSION;  Surgeon: [name], MD;  Location: [facility] CATH LAB;  Service: Cardiovascular;  Laterality: N/A;  . Atrial flutter ablation N/A 09/24/2011    Procedure: ATRIAL FLUTTER ABLATION;  Surgeon: [name], MD;  Location: [facility] CATH LAB;  Service: Cardiovascular;  Laterality: N/A;   Family History  Problem Relation Age of Onset  . Heart disease Father   . Hypertension Father   . Breast cancer Sister   . Cancer Sister     breast   History  Substance Use Topics  . Smoking status: Never Smoker   . Smokeless tobacco: Never Used  . Alcohol Use: No   OB History    No data available     Review of Systems  Constitutional: Negative for fever and chills.  Respiratory: Negative for shortness of breath.   Cardiovascular: Negative for chest pain.  Gastrointestinal: Positive for vomiting, abdominal pain and diarrhea. Negative for blood in stool.  Genitourinary: Negative for dysuria, frequency and flank pain.  Musculoskeletal: Negative for back pain, neck pain and neck stiffness.  Skin: Negative for rash and wound.  Neurological: Negative for dizziness, weakness, light-headedness, numbness and headaches.  All other systems reviewed and are negative.  Allergies  Review of patient's allergies indicates no known allergies.  Home Medications   Prior to Admission medications   Medication Sig Start Date End Date Taking? Authorizing Provider  albuterol (PROVENTIL HFA;VENTOLIN HFA)  108 (90 BASE) MCG/ACT inhaler Inhale 1-2 puffs into the lungs every 4 (four) hours as needed for wheezing or shortness of breath.   Yes Historical Provider, MD  allopurinol (ZYLOPRIM) 300 MG tablet Take 300 mg by mouth daily. Take one tablet  daily 07/07/13  Yes Historical Provider, MD  amiodarone (PACERONE) 200 MG tablet Take 1 tablet (200 mg total) by mouth daily. 10/05/13  Yes [name] P [name], MD  amLODipine (NORVASC) 5 MG tablet Take 5 mg by mouth daily. 09/14/13  Yes Historical Provider, MD  cloNIDine (CATAPRES) 0.2 MG tablet Take 0.2 mg by mouth 2 (two) times daily.   Yes Historical Provider, MD  CRESTOR 10 MG tablet Take 10 mg by mouth daily.  08/17/14  Yes Historical Provider, MD  fluticasone (FLONASE) 50 MCG/ACT nasal spray Place 2 sprays into both nostrils daily as needed for allergies or rhinitis.   Yes Historical Provider, MD  furosemide (LASIX) 40 MG tablet Take 40 mg by mouth daily.    Yes Historical Provider, MD  ipratropium (ATROVENT HFA) 17 MCG/ACT inhaler Inhale 2 puffs into the lungs every 4 (four) hours as needed for wheezing.   Yes Historical Provider, MD  levothyroxine (SYNTHROID, LEVOTHROID) 125 MCG tablet Take 125 mcg by mouth daily before breakfast.   Yes Historical Provider, MD  linagliptin (TRADJENTA) 5 MG TABS tablet Take 1 tablet (5 mg total) by mouth daily. 10/05/13  Yes [name] P [name], MD  lisinopril (PRINIVIL,ZESTRIL) 10 MG tablet Take 1 tablet (10 mg total) by mouth daily. 04/07/13  Yes [name], MD  loperamide (IMODIUM) 2 MG capsule Take 4 mg by mouth 4 (four) times daily as needed for diarrhea or loose stools.   Yes Historical Provider, MD  warfarin (COUMADIN) 5 MG tablet Take as directed by coumadin clinic Patient taking differently: Take 2.5-5 mg by mouth daily. Take 5mg  on Tuesday, Thursday and Saturdays. Take 2.5mg  all other days of the week 12/16/13  Yes [name], MD  dicyclomine (BENTYL) 20 MG tablet Take 1 tablet (20 mg total) by mouth 2 (two)  times daily as needed for spasms. 10/16/14   [name] Rice, MD  potassium chloride SA (K-DUR,KLOR-CON) 20 MEQ tablet Take 2 tablets (40 mEq total) by mouth twice a day. Patient not taking: Reported on 10/16/2014 08/26/13   Lelon Perla, MD   Triage Vitals: BP 141/57 mmHg  Pulse 77  Temp(Src) 98 F (36.7 C)  Resp 20  Ht 5\' 1"  (1.549 m)  Wt 239 lb (108.41 kg)  BMI 45.18 kg/m2  SpO2 95%  Physical Exam  Constitutional: She is oriented to person, place, and time. She appears well-developed and well-nourished. No distress.  HENT:  Head: Normocephalic and atraumatic.  Eyes: Conjunctivae and EOM are normal.  Neck: Neck supple. No tracheal deviation present.  Cardiovascular: Normal rate.   Pulmonary/Chest: Effort normal and breath sounds normal. No respiratory distress. She has no wheezes. She has no rales.  She exhibits no tenderness.  Abdominal: She exhibits no distension and no mass. There is no tenderness. There is no rebound and no guarding.  Musculoskeletal: Normal range of motion.  No CVA tenderness bilaterally.  Neurological: She is alert and oriented to person, place, and time.  Moves all Extremities without deficit. Sensation is grossly intact.  Skin: Skin is warm and dry.  Psychiatric: She has a normal mood and affect. Her behavior is normal.  Nursing note and vitals reviewed.  ED Course  Procedures (including critical care time)  DIAGNOSTIC STUDIES: Oxygen Saturation is 95% on RA, normal by my interpretation.    COORDINATION OF CARE: 3:48 AM- Discussed plans to order diagnostic CT of abdomen pelvis, and lab work. Will give patient IV fluids and potassium chloride. Pt advised of plan for treatment and pt agrees.  Labs Review Labs Reviewed  CBC WITH DIFFERENTIAL - Abnormal; Notable for the following:    WBC 11.4 (*)    Hemoglobin 11.7 (*)    MCV 77.3 (*)    MCH 23.7 (*)    RDW 16.3 (*)    Neutro Abs 7.9 (*)    Monocytes Absolute 1.4 (*)    All other components  within normal limits  COMPREHENSIVE METABOLIC PANEL - Abnormal; Notable for the following:    Potassium 2.8 (*)    Glucose, Bld 187 (*)    Albumin 3.3 (*)    AST 78 (*)    ALT 44 (*)    Alkaline Phosphatase 160 (*)    GFR calc non Af Amer 84 (*)    All other components within normal limits  POTASSIUM - Abnormal; Notable for the following:    Potassium 3.2 (*)    All other components within normal limits  LIPASE, BLOOD   Imaging Review No results found.   EKG Interpretation None     MDM   Final diagnoses:  Abdominal pain  Diarrhea  Hypokalemia    I personally performed the services described in this documentation, which was scribed in my presence. The recorded information has been reviewed and is accurate.  Patient's abdomen is benign. She has had multiple episodes of diarrhea with hypokalemia present. Given IV fluids and potassium was replaced. Signed out to oncoming physician pending recheck of patient's potassium. Anticipate discharge home. Return precautions given.  [name] Rice, MD 10/24/14 [phone]

## 2014-10-16 NOTE — Discharge Instructions (Signed)

## 2014-10-16 NOTE — ED Notes (Signed)
Pt states wants to take 2 of her own potassium pills at home. -- will ask dr Doy Mince.

## 2014-10-18 ENCOUNTER — Ambulatory Visit (INDEPENDENT_AMBULATORY_CARE_PROVIDER_SITE_OTHER): Payer: Medicare Other | Admitting: *Deleted

## 2014-10-18 DIAGNOSIS — Z7901 Long term (current) use of anticoagulants: Secondary | ICD-10-CM

## 2014-10-18 DIAGNOSIS — Z5181 Encounter for therapeutic drug level monitoring: Secondary | ICD-10-CM

## 2014-10-18 DIAGNOSIS — I4891 Unspecified atrial fibrillation: Secondary | ICD-10-CM

## 2014-10-18 DIAGNOSIS — I4892 Unspecified atrial flutter: Secondary | ICD-10-CM

## 2014-10-18 LAB — POCT INR: INR: 2.7

## 2014-10-18 NOTE — ED Provider Notes (Signed)
Care assumed from Dr. Lita Mains with plan to DC if potassium improved on recheck.  Recheck K was 3.2.  Stable for dc home.    Clinical Impression: 1. Diarrhea   2. Abdominal pain   3. Hypokalemia       [name] Siren III, MD 10/18/14 [phone]

## 2014-11-08 ENCOUNTER — Ambulatory Visit (INDEPENDENT_AMBULATORY_CARE_PROVIDER_SITE_OTHER): Payer: Medicare Other | Admitting: Pharmacist

## 2014-11-08 DIAGNOSIS — I4891 Unspecified atrial fibrillation: Secondary | ICD-10-CM

## 2014-11-08 DIAGNOSIS — Z5181 Encounter for therapeutic drug level monitoring: Secondary | ICD-10-CM

## 2014-11-08 DIAGNOSIS — Z7901 Long term (current) use of anticoagulants: Secondary | ICD-10-CM

## 2014-11-08 DIAGNOSIS — I4892 Unspecified atrial flutter: Secondary | ICD-10-CM

## 2014-11-08 LAB — POCT INR: INR: 1.6

## 2014-11-22 ENCOUNTER — Ambulatory Visit (INDEPENDENT_AMBULATORY_CARE_PROVIDER_SITE_OTHER): Payer: Medicare Other | Admitting: *Deleted

## 2014-11-22 DIAGNOSIS — I4891 Unspecified atrial fibrillation: Secondary | ICD-10-CM

## 2014-11-22 DIAGNOSIS — Z7901 Long term (current) use of anticoagulants: Secondary | ICD-10-CM

## 2014-11-22 DIAGNOSIS — Z5181 Encounter for therapeutic drug level monitoring: Secondary | ICD-10-CM

## 2014-11-22 DIAGNOSIS — I4892 Unspecified atrial flutter: Secondary | ICD-10-CM

## 2014-11-22 LAB — POCT INR: INR: 2

## 2014-12-08 ENCOUNTER — Ambulatory Visit: Payer: Medicare Other | Admitting: Internal Medicine

## 2014-12-14 ENCOUNTER — Ambulatory Visit (INDEPENDENT_AMBULATORY_CARE_PROVIDER_SITE_OTHER): Payer: Medicare Other | Admitting: *Deleted

## 2014-12-14 DIAGNOSIS — I4891 Unspecified atrial fibrillation: Secondary | ICD-10-CM

## 2014-12-14 DIAGNOSIS — Z7901 Long term (current) use of anticoagulants: Secondary | ICD-10-CM

## 2014-12-14 DIAGNOSIS — I4892 Unspecified atrial flutter: Secondary | ICD-10-CM

## 2014-12-14 DIAGNOSIS — Z5181 Encounter for therapeutic drug level monitoring: Secondary | ICD-10-CM

## 2014-12-14 LAB — POCT INR: INR: 1.7

## 2014-12-28 ENCOUNTER — Encounter: Payer: Self-pay | Admitting: Internal Medicine

## 2014-12-28 ENCOUNTER — Ambulatory Visit (INDEPENDENT_AMBULATORY_CARE_PROVIDER_SITE_OTHER): Payer: Medicare Other | Admitting: Internal Medicine

## 2014-12-28 ENCOUNTER — Ambulatory Visit (INDEPENDENT_AMBULATORY_CARE_PROVIDER_SITE_OTHER): Payer: Medicare Other | Admitting: *Deleted

## 2014-12-28 VITALS — BP 172/80 | HR 65 | Ht 61.0 in | Wt 250.0 lb

## 2014-12-28 DIAGNOSIS — I4891 Unspecified atrial fibrillation: Secondary | ICD-10-CM

## 2014-12-28 DIAGNOSIS — I5032 Chronic diastolic (congestive) heart failure: Secondary | ICD-10-CM

## 2014-12-28 DIAGNOSIS — I480 Paroxysmal atrial fibrillation: Secondary | ICD-10-CM

## 2014-12-28 DIAGNOSIS — Z79899 Other long term (current) drug therapy: Secondary | ICD-10-CM

## 2014-12-28 DIAGNOSIS — Z5181 Encounter for therapeutic drug level monitoring: Secondary | ICD-10-CM

## 2014-12-28 DIAGNOSIS — I4892 Unspecified atrial flutter: Secondary | ICD-10-CM

## 2014-12-28 DIAGNOSIS — Z7901 Long term (current) use of anticoagulants: Secondary | ICD-10-CM

## 2014-12-28 LAB — POCT INR: INR: 1.8

## 2014-12-28 MED ORDER — AMLODIPINE BESYLATE 5 MG PO TABS: ORAL_TABLET | ORAL | Status: DC

## 2014-12-28 MED ORDER — SPIRONOLACTONE 25 MG PO TABS: 25.0000 mg | ORAL_TABLET | Freq: Every day | ORAL | Status: DC

## 2014-12-28 NOTE — Patient Instructions (Signed)
Your physician has recommended you make the following change in your medication:  1) STOP Potassium 2) START Aldactone 25 mg daily 3) DECREASE Amlodipine to 5 days per week  Your physician recommends that you return for lab work in: 1 week for BMET  Your physician recommends that you return for lab work in: 3 weeks for BMET  Your physician recommends that you schedule a follow-up appointment in: 6 months with Roderic Palau, NP.

## 2014-12-28 NOTE — Progress Notes (Signed)
Patient Care Team: Marijean Bravo, MD as PCP - General (Internal Medicine) Shawnee Knapp, MD as Resident (Family Medicine)   HPI  [name] is a 74 y.o. female seen in followup for atrial arrhythmias associated with diastolic heart failure. She's been tried on flecainide, dofetilide, and most recently has been on amiodarone.  Echocardiogram November 2012 demonstrated normal left ventricular function with mild LVH; atrial chambers were also normal in size    Amiodarone surveillance labs are being followed by Dr. Jobe Igo. Her thyroid recently readjusted.  She's had problems with edema. She continues to have significant issues with systolic hypertension. She is using her sleep apnea machine and apparently it has been recently reviewed     She is also noted peripheral edema over recent weeks is a partially responsive to getting her feet up. Dyspnea is stable  Past Medical History  Diagnosis Date  . Hypertension     a.  Renal arterial Dopplers 12/2011: 1-59% right renal artery stenosis  . Diabetes mellitus   . Hyperlipidemia   . Gout   . Paroxysmal Afib/Flutter     a. 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  . Diastolic CHF, chronic     a.  echo 2006 - ef 55-65%; mild diast dysfxn;    b. Echo 08/2011: Mild LVH, EF 60%;  c. 04/2013 Echo: EF 65-69%, mild conc LVH;  08/2014 Echo: EF 60-65%, mild-mod MR.  . Arthritis   . Morbid obesity   . Back pain   . Chronic kidney disease   . Obstructive sleep apnea on CPAP   . Chronic anticoagulation     due to aflutter    Past Surgical History  Procedure Laterality Date  . Cholecystectomy    . Total abdominal hysterectomy    . Appendectomy    . Tonsillectomy  1982  . Cardioversion  10/22/2011    Procedure: CARDIOVERSION;  Surgeon: [name], MD;  Location: [place];  Service: Cardiovascular;  Laterality: N/A;  . Cardioversion N/A 09/10/2011    Procedure: CARDIOVERSION;  Surgeon: [name], MD;  Location: [facility] CATH LAB;   Service: Cardiovascular;  Laterality: N/A;  . Atrial flutter ablation N/A 09/24/2011    Procedure: ATRIAL FLUTTER ABLATION;  Surgeon: [name], MD;  Location: [facility] CATH LAB;  Service: Cardiovascular;  Laterality: N/A;    Current Outpatient Prescriptions  Medication Sig Dispense Refill  . albuterol (PROVENTIL HFA;VENTOLIN HFA) 108 (90 BASE) MCG/ACT inhaler Inhale 1-2 puffs into the lungs every 4 (four) hours as needed for wheezing or shortness of breath.    . allopurinol (ZYLOPRIM) 300 MG tablet Take 300 mg by mouth daily. Take one tablet  daily    . amiodarone (PACERONE) 200 MG tablet Take 1 tablet (200 mg total) by mouth daily. 90 tablet 3  . amLODipine (NORVASC) 5 MG tablet Take 5 mg by mouth daily.    . cloNIDine (CATAPRES) 0.2 MG tablet Take 0.2 mg by mouth 2 (two) times daily.    . CRESTOR 10 MG tablet Take 10 mg by mouth daily.   1  . fluticasone (FLONASE) 50 MCG/ACT nasal spray Place 2 sprays into both nostrils daily as needed for allergies or rhinitis.    . furosemide (LASIX) 40 MG tablet Take 40 mg by mouth daily.     Marland Kitchen ipratropium (ATROVENT HFA) 17 MCG/ACT inhaler Inhale 2 puffs into the lungs every 4 (four) hours as needed for wheezing.    Marland Kitchen levothyroxine (SYNTHROID, LEVOTHROID) 125  MCG tablet Take 125 mcg by mouth daily before breakfast.    . linagliptin (TRADJENTA) 5 MG TABS tablet Take 1 tablet (5 mg total) by mouth daily. 30 tablet 3  . lisinopril (PRINIVIL,ZESTRIL) 10 MG tablet Take 1 tablet (10 mg total) by mouth daily. 30 tablet 0  . loperamide (IMODIUM) 2 MG capsule Take 4 mg by mouth 4 (four) times daily as needed for diarrhea or loose stools.    . potassium chloride SA (K-DUR,KLOR-CON) 20 MEQ tablet Take 2 tablets (40 mEq total) by mouth twice a day. 120 tablet 3  . warfarin (COUMADIN) 5 MG tablet Take as directed by coumadin clinic (Patient taking differently: Take 2.5-5 mg by mouth daily. Take 5mg  on Tuesday, Thursday and Saturdays. Take 2.5mg  all other days of the  week) 30 tablet 3   No current facility-administered medications for this visit.    No Known Allergies  Review of Systems negative except from HPI and PMH  Physical Exam BP 172/80 mmHg  Pulse 65  Ht 5\' 1"  (1.549 m)  Wt 250 lb (113.399 kg)  BMI 47.26 kg/m2 Well developed and well nourished in no acute distress HENT normal E scleral and icterus clear Neck Supple JVP flat; carotids brisk and full Clear to ausculation  Regular rate and rhythm, no murmurs gallops or rub Soft with active bowel sounds No clubbing cyanosis 1+ Edema Alert and oriented, grossly normal motor and sensory function Skin Warm and Dry   ECG demonstrates sinus rhythm at 65 Intervals 19/08/48   Assessment and  Plan  Hypertension-poorly controlled   Paroxysmal atrial fibrillation   HFpEF   Morbidly obese   We will add Aldactone both her diuretics as well as to hopefully improve blood pressure control. She's had a propensity to hypokalemia. We will plan to discontinue her potassium supplementation and remeasure her metabolic profile in one week.   We will decrease her amiodarone from 200 7 days a week--5 days a week. Surveillance laboratories are being followed by Dr. Jobe Igo

## 2015-01-02 ENCOUNTER — Telehealth: Payer: Self-pay | Admitting: Internal Medicine

## 2015-01-02 DIAGNOSIS — Z79899 Other long term (current) drug therapy: Secondary | ICD-10-CM

## 2015-01-02 DIAGNOSIS — Z7689 Persons encountering health services in other specified circumstances: Secondary | ICD-10-CM

## 2015-01-02 NOTE — Addendum Note (Signed)
Addended by: Stanton Kidney on: 01/02/2015 02:46 PM   Modules accepted: Orders

## 2015-01-02 NOTE — Telephone Encounter (Signed)
New message     Pt is starting her spironolactone today.  She has a lab appt sched for thurs.  Should she resch her lab appt further out or keep it since she will not be on the medicatin long prior to labs

## 2015-01-02 NOTE — Telephone Encounter (Signed)
Advised patient we will reschedule labs to 3/22 and 4/5. She is agreeable to plan.

## 2015-01-04 ENCOUNTER — Other Ambulatory Visit: Payer: Medicare Other

## 2015-01-05 ENCOUNTER — Telehealth: Payer: Self-pay | Admitting: Internal Medicine

## 2015-01-05 NOTE — Telephone Encounter (Signed)
Called patient back. Need clarification on Amlodipine and Amiodarone frequency. According to last office visit note patient is to take Amiodarone 5 days per week, but Amlodipine is ordered 5 days per week. Will forward to Dr. Caryl Comes.

## 2015-01-05 NOTE — Telephone Encounter (Signed)
New Message   Patient needs to know how to take a medication. Please give her a call back

## 2015-01-09 ENCOUNTER — Other Ambulatory Visit (INDEPENDENT_AMBULATORY_CARE_PROVIDER_SITE_OTHER): Payer: Medicare Other | Admitting: *Deleted

## 2015-01-09 DIAGNOSIS — Z79899 Other long term (current) drug therapy: Secondary | ICD-10-CM

## 2015-01-09 LAB — BASIC METABOLIC PANEL
BUN: 15 mg/dL (ref 6–23)
CO2: 28 mEq/L (ref 19–32)
CREATININE: 0.83 mg/dL (ref 0.40–1.20)
Calcium: 8.8 mg/dL (ref 8.4–10.5)
Chloride: 101 mEq/L (ref 96–112)
GFR: 71.98 mL/min (ref >60.00)
GLUCOSE: 235 mg/dL — AB (ref 70–99)
POTASSIUM: 4.1 meq/L (ref 3.5–5.1)
Sodium: 135 mEq/L (ref 135–145)

## 2015-01-09 MED ORDER — AMIODARONE HCL 200 MG PO TABS: ORAL_TABLET | ORAL | Status: DC

## 2015-01-09 MED ORDER — AMLODIPINE BESYLATE 5 MG PO TABS: 5.0000 mg | ORAL_TABLET | Freq: Every day | ORAL | Status: DC

## 2015-01-09 NOTE — Telephone Encounter (Signed)
lmtcb  (I sent in correct prescriptions. When patient calls back - clarify to take Amio 5 days per week and take Amlodipine daily)

## 2015-01-11 NOTE — Telephone Encounter (Signed)
Patient and I spoke the other day about this, but I called her to make sure she understood/received instructions. Patient verbalized understanding.

## 2015-01-18 ENCOUNTER — Other Ambulatory Visit: Payer: Medicare Other

## 2015-01-23 ENCOUNTER — Other Ambulatory Visit: Payer: Medicare Other

## 2015-01-31 ENCOUNTER — Ambulatory Visit (INDEPENDENT_AMBULATORY_CARE_PROVIDER_SITE_OTHER): Payer: Medicare Other | Admitting: *Deleted

## 2015-01-31 ENCOUNTER — Other Ambulatory Visit (INDEPENDENT_AMBULATORY_CARE_PROVIDER_SITE_OTHER): Payer: Medicare Other | Admitting: *Deleted

## 2015-01-31 DIAGNOSIS — Z7901 Long term (current) use of anticoagulants: Secondary | ICD-10-CM | POA: Diagnosis not present

## 2015-01-31 DIAGNOSIS — Z5181 Encounter for therapeutic drug level monitoring: Secondary | ICD-10-CM | POA: Diagnosis not present

## 2015-01-31 DIAGNOSIS — I4892 Unspecified atrial flutter: Secondary | ICD-10-CM

## 2015-01-31 DIAGNOSIS — I480 Paroxysmal atrial fibrillation: Secondary | ICD-10-CM

## 2015-01-31 DIAGNOSIS — I4891 Unspecified atrial fibrillation: Secondary | ICD-10-CM | POA: Diagnosis not present

## 2015-01-31 DIAGNOSIS — Z79899 Other long term (current) drug therapy: Secondary | ICD-10-CM | POA: Diagnosis not present

## 2015-01-31 LAB — BASIC METABOLIC PANEL
BUN: 29 mg/dL — AB (ref 6–23)
CO2: 27 mEq/L (ref 19–32)
Calcium: 9.7 mg/dL (ref 8.4–10.5)
Chloride: 101 mEq/L (ref 96–112)
Creatinine, Ser: 0.85 mg/dL (ref 0.40–1.20)
GFR: 70.01 mL/min (ref >60.00)
Glucose, Bld: 130 mg/dL — ABNORMAL HIGH (ref 70–99)
Potassium: 4.5 mEq/L (ref 3.5–5.1)
Sodium: 137 mEq/L (ref 135–145)

## 2015-01-31 LAB — POCT INR: INR: 1.9

## 2015-02-05 ENCOUNTER — Other Ambulatory Visit: Payer: Self-pay | Admitting: *Deleted

## 2015-02-05 MED ORDER — WARFARIN SODIUM 5 MG PO TABS: ORAL_TABLET | ORAL | Status: DC

## 2015-02-06 ENCOUNTER — Encounter (HOSPITAL_COMMUNITY): Payer: Self-pay

## 2015-02-06 ENCOUNTER — Emergency Department (HOSPITAL_COMMUNITY): Payer: Medicare Other

## 2015-02-06 ENCOUNTER — Emergency Department (HOSPITAL_COMMUNITY)
Admission: EM | Admit: 2015-02-06 | Discharge: 2015-02-06 | Disposition: A | Discharge status: Home / Self Care | Payer: Medicare Other | Attending: Emergency Medicine | Admitting: Emergency Medicine

## 2015-02-06 DIAGNOSIS — S4992XA Unspecified injury of left shoulder and upper arm, initial encounter: Secondary | ICD-10-CM | POA: Diagnosis present

## 2015-02-06 DIAGNOSIS — S8992XA Unspecified injury of left lower leg, initial encounter: Secondary | ICD-10-CM | POA: Diagnosis not present

## 2015-02-06 DIAGNOSIS — Y9389 Activity, other specified: Secondary | ICD-10-CM | POA: Diagnosis not present

## 2015-02-06 DIAGNOSIS — E785 Hyperlipidemia, unspecified: Secondary | ICD-10-CM | POA: Diagnosis not present

## 2015-02-06 DIAGNOSIS — Y998 Other external cause status: Secondary | ICD-10-CM | POA: Insufficient documentation

## 2015-02-06 DIAGNOSIS — Z7901 Long term (current) use of anticoagulants: Secondary | ICD-10-CM | POA: Insufficient documentation

## 2015-02-06 DIAGNOSIS — Z79899 Other long term (current) drug therapy: Secondary | ICD-10-CM | POA: Diagnosis not present

## 2015-02-06 DIAGNOSIS — S42142A Displaced fracture of glenoid cavity of scapula, left shoulder, initial encounter for closed fracture: Secondary | ICD-10-CM

## 2015-02-06 DIAGNOSIS — N189 Chronic kidney disease, unspecified: Secondary | ICD-10-CM | POA: Diagnosis not present

## 2015-02-06 DIAGNOSIS — M109 Gout, unspecified: Secondary | ICD-10-CM | POA: Diagnosis not present

## 2015-02-06 DIAGNOSIS — I5030 Unspecified diastolic (congestive) heart failure: Secondary | ICD-10-CM | POA: Insufficient documentation

## 2015-02-06 DIAGNOSIS — W01198A Fall on same level from slipping, tripping and stumbling with subsequent striking against other object, initial encounter: Secondary | ICD-10-CM | POA: Insufficient documentation

## 2015-02-06 DIAGNOSIS — I129 Hypertensive chronic kidney disease with stage 1 through stage 4 chronic kidney disease, or unspecified chronic kidney disease: Secondary | ICD-10-CM | POA: Insufficient documentation

## 2015-02-06 DIAGNOSIS — E119 Type 2 diabetes mellitus without complications: Secondary | ICD-10-CM | POA: Insufficient documentation

## 2015-02-06 DIAGNOSIS — Z9981 Dependence on supplemental oxygen: Secondary | ICD-10-CM | POA: Insufficient documentation

## 2015-02-06 DIAGNOSIS — G4733 Obstructive sleep apnea (adult) (pediatric): Secondary | ICD-10-CM | POA: Diagnosis not present

## 2015-02-06 DIAGNOSIS — Y929 Unspecified place or not applicable: Secondary | ICD-10-CM | POA: Insufficient documentation

## 2015-02-06 DIAGNOSIS — S42152A Displaced fracture of neck of scapula, left shoulder, initial encounter for closed fracture: Secondary | ICD-10-CM

## 2015-02-06 MED ORDER — MORPHINE SULFATE 4 MG/ML IJ SOLN
4.0000 mg | INTRAMUSCULAR | Status: DC | PRN
Start: 2015-02-06 — End: 2015-02-06
  Filled 2015-02-06: qty 1

## 2015-02-06 MED ORDER — OXYCODONE-ACETAMINOPHEN 5-325 MG PO TABS
2.0000 | ORAL_TABLET | Freq: Once | ORAL | Status: AC
  Administered 2015-02-06: 2 via ORAL
  Filled 2015-02-06: qty 2

## 2015-02-06 MED ORDER — OXYCODONE-ACETAMINOPHEN 5-325 MG PO TABS: 1.0000 | ORAL_TABLET | ORAL | Status: DC | PRN

## 2015-02-06 NOTE — ED Notes (Signed)
Pt. Is from home, slipped on hard wood floors today while wearing new sandals. Pt. Denies dizziness at time of fall. Pt. Denies LOC. EMS reports deformity to L shoulder and small scrap to L knee. Pt. AxO x4.

## 2015-02-06 NOTE — ED Provider Notes (Signed)
CSN: [id number]     Arrival date & time 02/06/15  66 History   First MD Initiated Contact with Patient 02/06/15 1458     Chief Complaint  Patient presents with  . Fall     (Consider location/radiation/quality/duration/timing/severity/associated sxs/prior Treatment) HPI  74 year old female presents with a fall and left shoulder injury. She was wearing the sandals and slipped on her hardwood floor, landing on her left side. She did not hit her head or pass out. There was never dizziness. No chest pain. Patient placed in a sling and swathe by EMS. No weakness or numbness. Also injured her left knee. Given intranasal pain medicine by EMS, pain improved.  Past Medical History  Diagnosis Date  . Hypertension     a.  Renal arterial Dopplers 12/2011: 1-59% right renal artery stenosis  . Diabetes mellitus   . Hyperlipidemia   . Gout   . Paroxysmal Afib/Flutter     a. 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  . Diastolic CHF, chronic     a.  echo 2006 - ef 55-65%; mild diast dysfxn;    b. Echo 08/2011: Mild LVH, EF 60%;  c. 04/2013 Echo: EF 65-69%, mild conc LVH;  08/2014 Echo: EF 60-65%, mild-mod MR.  . Arthritis   . Morbid obesity   . Back pain   . Chronic kidney disease   . Obstructive sleep apnea on CPAP   . Chronic anticoagulation     due to aflutter   Past Surgical History  Procedure Laterality Date  . Cholecystectomy    . Total abdominal hysterectomy    . Appendectomy    . Tonsillectomy  1982  . Cardioversion  10/22/2011    Procedure: CARDIOVERSION;  Surgeon: [name], MD;  Location: [place];  Service: Cardiovascular;  Laterality: N/A;  . Cardioversion N/A 09/10/2011    Procedure: CARDIOVERSION;  Surgeon: [name], MD;  Location: [facility] CATH LAB;  Service: Cardiovascular;  Laterality: N/A;  . Atrial flutter ablation N/A 09/24/2011    Procedure: ATRIAL FLUTTER ABLATION;  Surgeon: [name], MD;  Location: [facility] CATH LAB;  Service: Cardiovascular;  Laterality: N/A;   Family  History  Problem Relation Age of Onset  . Heart disease Father   . Hypertension Father   . Breast cancer Sister   . Cancer Sister     breast   History  Substance Use Topics  . Smoking status: Never Smoker   . Smokeless tobacco: Never Used  . Alcohol Use: No   OB History    No data available     Review of Systems  Cardiovascular: Negative for chest pain.  Musculoskeletal: Positive for arthralgias.  Skin: Positive for wound.  Neurological: Negative for dizziness, syncope, weakness and numbness.  All other systems reviewed and are negative.     Allergies  Review of patient's allergies indicates no known allergies.  Home Medications   Prior to Admission medications   Medication Sig Start Date End Date Taking? Authorizing Provider  albuterol (PROVENTIL HFA;VENTOLIN HFA) 108 (90 BASE) MCG/ACT inhaler Inhale 1-2 puffs into the lungs every 4 (four) hours as needed for wheezing or shortness of breath.    Historical Provider, MD  allopurinol (ZYLOPRIM) 300 MG tablet Take 300 mg by mouth daily. Take one tablet  daily 07/07/13   Historical Provider, MD  amiodarone (PACERONE) 200 MG tablet Take one tablet (200 mg total) by mouth 5 days per week. 01/09/15   [name], MD  amLODipine (NORVASC) 5 MG tablet  Take 1 tablet (5 mg total) by mouth daily. 01/09/15   [name], MD  cloNIDine (CATAPRES) 0.2 MG tablet Take 0.2 mg by mouth 2 (two) times daily.    Historical Provider, MD  CRESTOR 10 MG tablet Take 10 mg by mouth daily.  08/17/14   Historical Provider, MD  fluticasone (FLONASE) 50 MCG/ACT nasal spray Place 2 sprays into both nostrils daily as needed for allergies or rhinitis.    Historical Provider, MD  furosemide (LASIX) 40 MG tablet Take 40 mg by mouth daily.     Historical Provider, MD  ipratropium (ATROVENT HFA) 17 MCG/ACT inhaler Inhale 2 puffs into the lungs every 4 (four) hours as needed for wheezing.    Historical Provider, MD  levothyroxine (SYNTHROID, LEVOTHROID) 125  MCG tablet Take 125 mcg by mouth daily before breakfast.    Historical Provider, MD  linagliptin (TRADJENTA) 5 MG TABS tablet Take 1 tablet (5 mg total) by mouth daily. 10/05/13   [name], MD  lisinopril (PRINIVIL,ZESTRIL) 10 MG tablet Take 1 tablet (10 mg total) by mouth daily. 04/07/13   [name], MD  loperamide (IMODIUM) 2 MG capsule Take 4 mg by mouth 4 (four) times daily as needed for diarrhea or loose stools.    Historical Provider, MD  spironolactone (ALDACTONE) 25 MG tablet Take 1 tablet (25 mg total) by mouth daily. 12/28/14   [name], MD  warfarin (COUMADIN) 5 MG tablet Take as directed by coumadin clinic 02/05/15   [name], MD   BP 124/51 mmHg  Pulse 77  Temp(Src) 97.7 F (36.5 C) (Oral)  SpO2 98% Physical Exam  Constitutional: She is oriented to person, place, and time. She appears well-developed and well-nourished.  HENT:  Head: Normocephalic and atraumatic.  Right Ear: External ear normal.  Left Ear: External ear normal.  Nose: Nose normal.  Eyes: Right eye exhibits no discharge. Left eye exhibits no discharge.  Cardiovascular: Normal rate, regular rhythm, normal heart sounds and intact distal pulses.   Pulses:      Radial pulses are 2+ on the left side.       Dorsalis pedis pulses are 2+ on the left side.  Pulmonary/Chest: Effort normal and breath sounds normal.  Abdominal: Soft. She exhibits no distension.  Musculoskeletal:       Left shoulder: She exhibits decreased range of motion, tenderness (posterior aspect) and bony tenderness. She exhibits normal pulse and normal strength.       Left knee: She exhibits normal range of motion. Tenderness found.       Legs: Normal strength and sensation in all 4 extremities. Normal deltoid sensation  Neurological: She is alert and oriented to person, place, and time.  Skin: Skin is warm and dry.  Vitals reviewed.   ED Course  Procedures (including critical care time) Labs Review Labs Reviewed -  No data to display   Dg Shoulder Left  02/06/2015   CLINICAL DATA:  [place] [address] from standing position in doorway, LEFT arm pain with inability to move, history hypertension, diabetes, CHF  EXAM: LEFT SHOULDER - 2+ VIEW  COMPARISON:  None  FINDINGS: Osseous demineralization.  AC joint alignment normal.  Displaced inferior anterior LEFT glenoid fracture.  No dislocation identified.  Superimposed artifacts on the scapular Y-view.  Visualized LEFT ribs intact.  IMPRESSION: Displaced anterior inferior LEFT glenoid fracture   Electronically Signed   By: Lavonia Dana M.D.   On: 02/06/2015 15:52   Dg Knee Complete 4 Views  Left  02/06/2015   CLINICAL DATA:  Fall.  Knee pain  EXAM: LEFT KNEE - COMPLETE 4+ VIEW  COMPARISON:  12/12/2014  FINDINGS: Negative for fracture. Moderate joint space narrowing medial joint compartment. Widening of the lateral joint. No significant effusion.  IMPRESSION: Negative for fracture.   Electronically Signed   By: Franchot Gallo M.D.   On: 02/06/2015 15:54     EKG Interpretation None      MDM   Final diagnoses:  Glenoid fracture of shoulder, left, closed, initial encounter    Patient with a mechanical fall and subsequent glenoid fracture with displacement. Discussed with Dr. Percell Miller of orthopedics who requests CT scan and follow-up in his office tomorrow. Given oral pain control. Neurovascularly intact. Stable for discharge, will place in a sling.    [name], MD 02/06/15 [phone]

## 2015-02-08 ENCOUNTER — Telehealth: Payer: Self-pay | Admitting: Internal Medicine

## 2015-02-08 NOTE — Telephone Encounter (Signed)
Patient was asking about her lab results from last week.  I informed her of preliminary results and explained that I would have Dr. Caryl Comes review and call her back if he had anything to add. She is agreeable to this plan.

## 2015-02-08 NOTE — Telephone Encounter (Signed)
New Message    Patient is calling , because she says the nurse called her. Please give patient a call back. Thanks

## 2015-02-13 ENCOUNTER — Telehealth: Payer: Self-pay | Admitting: Internal Medicine

## 2015-02-13 MED ORDER — AMIODARONE HCL 200 MG PO TABS: ORAL_TABLET | ORAL | Status: DC

## 2015-02-13 NOTE — Telephone Encounter (Signed)
Called patient and informed her that I would send in for Amiodarone 200 mg 5 days per week. She was very appreciative for helping. Rx sent to [facility] per pt request.

## 2015-02-13 NOTE — Telephone Encounter (Signed)
New Message        Pt calling stating that she went to the pharmacy to pick up her Amiodarone  And was told that it had been stopped. Pt states that when she saw Dr. Caryl Comes he told her to take it 5 days instead of 7 days and she has run out and needs that medication. Please call back and advise.

## 2015-02-14 ENCOUNTER — Ambulatory Visit (INDEPENDENT_AMBULATORY_CARE_PROVIDER_SITE_OTHER): Payer: Medicare Other

## 2015-02-14 DIAGNOSIS — Z5181 Encounter for therapeutic drug level monitoring: Secondary | ICD-10-CM

## 2015-02-14 DIAGNOSIS — Z7901 Long term (current) use of anticoagulants: Secondary | ICD-10-CM

## 2015-02-14 DIAGNOSIS — I4891 Unspecified atrial fibrillation: Secondary | ICD-10-CM | POA: Diagnosis not present

## 2015-02-14 DIAGNOSIS — I4892 Unspecified atrial flutter: Secondary | ICD-10-CM | POA: Diagnosis not present

## 2015-02-14 LAB — POCT INR: INR: 2.3

## 2015-02-22 ENCOUNTER — Encounter: Payer: Self-pay | Admitting: Internal Medicine

## 2015-02-22 ENCOUNTER — Ambulatory Visit (INDEPENDENT_AMBULATORY_CARE_PROVIDER_SITE_OTHER): Payer: Medicare Other | Admitting: Internal Medicine

## 2015-02-22 VITALS — BP 125/60 | HR 69 | Ht 61.0 in | Wt 248.6 lb

## 2015-02-22 DIAGNOSIS — I480 Paroxysmal atrial fibrillation: Secondary | ICD-10-CM

## 2015-02-22 NOTE — Patient Instructions (Signed)
Medication Instructions:  Hold Aldactone.  Please call us in 3 weeks and let us know how you are doing.  Labwork: None ordered  Testing/Procedures: None ordered  Follow-Up: Keep follow up in September 2016 with Roderic Palau, NP.  Thank you for choosing [facility]!!

## 2015-02-22 NOTE — Progress Notes (Signed)
Patient Care Team: Marijean Bravo, MD as PCP - General (Internal Medicine) Shawnee Knapp, MD as Resident (Family Medicine)   HPI  [name] is a 74 y.o. female seen in followup for atrial arrhythmias associated with diastolic heart failure. She's been tried on flecainide, dofetilide, and most recently has been on amiodarone.  Echocardiogram November 2012 demonstrated normal left ventricular function with mild LVH; atrial chambers were also normal in size    Amiodarone surveillance labs are being followed by Dr. Jobe Igo. Her thyroid recently readjusted.  Patient denies symptoms of GI intolerance, sun sensitivity, neurological symptoms attributable to amiodarone.     At her last visit systolic blood pressure  was a major issue and edema minor;  we added Aldactone.  She was seen in the emergency room/16 following a fall. These records were reviewed       Past Medical History  Diagnosis Date  . Hypertension     a.  Renal arterial Dopplers 12/2011: 1-59% right renal artery stenosis  . Diabetes mellitus   . Hyperlipidemia   . Gout   . Paroxysmal Afib/Flutter     a. 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  . Diastolic CHF, chronic     a.  echo 2006 - ef 55-65%; mild diast dysfxn;    b. Echo 08/2011: Mild LVH, EF 60%;  c. 04/2013 Echo: EF 65-69%, mild conc LVH;  08/2014 Echo: EF 60-65%, mild-mod MR.  . Arthritis   . Morbid obesity   . Back pain   . Chronic kidney disease   . Obstructive sleep apnea on CPAP   . Chronic anticoagulation     due to aflutter    Past Surgical History  Procedure Laterality Date  . Cholecystectomy    . Total abdominal hysterectomy    . Appendectomy    . Tonsillectomy  1982  . Cardioversion  10/22/2011    Procedure: CARDIOVERSION;  Surgeon: [name], MD;  Location: [facility];  Service: Cardiovascular;  Laterality: N/A;  . Cardioversion N/A 09/10/2011    Procedure: CARDIOVERSION;  Surgeon: [name], MD;  Location: [facility] CATH LAB;   Service: Cardiovascular;  Laterality: N/A;  . Atrial flutter ablation N/A 09/24/2011    Procedure: ATRIAL FLUTTER ABLATION;  Surgeon: [name], MD;  Location: [facility] CATH LAB;  Service: Cardiovascular;  Laterality: N/A;    Current Outpatient Prescriptions  Medication Sig Dispense Refill  . albuterol (PROVENTIL HFA;VENTOLIN HFA) 108 (90 BASE) MCG/ACT inhaler Inhale 1-2 puffs into the lungs every 4 (four) hours as needed for wheezing or shortness of breath.    . allopurinol (ZYLOPRIM) 300 MG tablet Take 300 mg by mouth daily. Take one tablet  daily    . amiodarone (PACERONE) 200 MG tablet Take 1 tablet (200 mg total) five days per week. 20 tablet 6  . amLODipine (NORVASC) 5 MG tablet Take 1 tablet (5 mg total) by mouth daily. 30 tablet 6  . cloNIDine (CATAPRES) 0.2 MG tablet Take 0.2 mg by mouth 2 (two) times daily.    . furosemide (LASIX) 40 MG tablet Take 40 mg by mouth daily.     Marland Kitchen ipratropium (ATROVENT HFA) 17 MCG/ACT inhaler Inhale 2 puffs into the lungs every 4 (four) hours as needed for wheezing.    Marland Kitchen levothyroxine (SYNTHROID, LEVOTHROID) 125 MCG tablet Take 125 mcg by mouth daily before breakfast.    . linagliptin (TRADJENTA) 5 MG TABS tablet Take 1 tablet (5 mg total) by mouth daily. [place]  tablet 3  . lisinopril (PRINIVIL,ZESTRIL) 10 MG tablet Take 1 tablet (10 mg total) by mouth daily. 30 tablet 0  . oxyCODONE-acetaminophen (PERCOCET) 5-325 MG per tablet Take 1-2 tablets by mouth every 4 (four) hours as needed for severe pain. 30 tablet 0  . rosuvastatin (CRESTOR) 10 MG tablet Take 10 mg by mouth every evening.    Marland Kitchen spironolactone (ALDACTONE) 25 MG tablet Take 1 tablet (25 mg total) by mouth daily. 30 tablet 6  . warfarin (COUMADIN) 5 MG tablet Take as directed by coumadin clinic (Patient taking differently: Take 2.5-5 mg by mouth daily at 6 PM. Takes 2.5mg  on Mon and Wed only Takes 5mg  all other days) 30 tablet 3   No current facility-administered medications for this visit.    No  Known Allergies  Review of Systems negative except from HPI and PMH  Physical Exam BP 125/60 mmHg  Pulse 69  Ht 5\' 1"  (1.549 m)  Wt 248 lb 9.6 oz (112.764 kg)  BMI 47.00 kg/m2 Well developed and Morbidly obese  in no acute distress HENT normal E scleral and icterus clear Neck Supple JVP flat; carotids brisk and full Clear to ausculation  Regular rate and rhythm, no murmurs gallops or rub Soft with active bowel sounds No clubbing cyanosis  No  Edema Alert and oriented, grossly normal motor and sensory function Skin Warm and Dry   ECG demonstrates sinus rhythm at 69 Intervals 20/07/41   Assessment and  Plan  Hypertension   Paroxysmal atrial fibrillation   HFpEF   Morbidly obese   Rash  ? Drug eruption    BP is much improved  We will decrease her amiodarone from 200 7 days a week--5 days a week. Surveillance laboratories are being followed by Dr. Jobe Igo   I wonder whether her rash, which may be particularly related to the initiation of Aldactone, isn't a drug eruption. I will ask her to hold the medication for 3 weeks and let us know. It has had a tremendously beneficial impact on her blood pressure and her edema. In the event that the eruption recedes, I will look into whether there is cross reactivity with eplerenone but I would be inclined to try it.

## 2015-02-27 ENCOUNTER — Ambulatory Visit: Payer: Medicaid Other | Admitting: Internal Medicine

## 2015-03-07 ENCOUNTER — Ambulatory Visit (INDEPENDENT_AMBULATORY_CARE_PROVIDER_SITE_OTHER): Payer: Medicare Other | Admitting: *Deleted

## 2015-03-07 DIAGNOSIS — Z5181 Encounter for therapeutic drug level monitoring: Secondary | ICD-10-CM

## 2015-03-07 DIAGNOSIS — Z7901 Long term (current) use of anticoagulants: Secondary | ICD-10-CM | POA: Diagnosis not present

## 2015-03-07 DIAGNOSIS — I4892 Unspecified atrial flutter: Secondary | ICD-10-CM

## 2015-03-07 DIAGNOSIS — I4891 Unspecified atrial fibrillation: Secondary | ICD-10-CM

## 2015-03-07 LAB — POCT INR: INR: 2

## 2015-03-28 ENCOUNTER — Ambulatory Visit (INDEPENDENT_AMBULATORY_CARE_PROVIDER_SITE_OTHER): Payer: Medicare Other | Admitting: *Deleted

## 2015-03-28 DIAGNOSIS — Z7901 Long term (current) use of anticoagulants: Secondary | ICD-10-CM

## 2015-03-28 DIAGNOSIS — Z5181 Encounter for therapeutic drug level monitoring: Secondary | ICD-10-CM | POA: Diagnosis not present

## 2015-03-28 DIAGNOSIS — I4891 Unspecified atrial fibrillation: Secondary | ICD-10-CM | POA: Diagnosis not present

## 2015-03-28 DIAGNOSIS — I4892 Unspecified atrial flutter: Secondary | ICD-10-CM | POA: Diagnosis not present

## 2015-03-28 LAB — POCT INR: INR: 1.7

## 2015-05-09 ENCOUNTER — Other Ambulatory Visit (HOSPITAL_COMMUNITY): Payer: Self-pay | Admitting: Internal Medicine

## 2015-05-09 DIAGNOSIS — Z1231 Encounter for screening mammogram for malignant neoplasm of breast: Secondary | ICD-10-CM

## 2015-05-10 ENCOUNTER — Ambulatory Visit (INDEPENDENT_AMBULATORY_CARE_PROVIDER_SITE_OTHER): Payer: Medicare Other

## 2015-05-10 DIAGNOSIS — Z5181 Encounter for therapeutic drug level monitoring: Secondary | ICD-10-CM

## 2015-05-10 DIAGNOSIS — I4892 Unspecified atrial flutter: Secondary | ICD-10-CM

## 2015-05-10 DIAGNOSIS — Z7901 Long term (current) use of anticoagulants: Secondary | ICD-10-CM | POA: Diagnosis not present

## 2015-05-10 DIAGNOSIS — I4891 Unspecified atrial fibrillation: Secondary | ICD-10-CM

## 2015-05-10 LAB — POCT INR: INR: 2

## 2015-05-11 ENCOUNTER — Ambulatory Visit (HOSPITAL_COMMUNITY)
Admission: RE | Admit: 2015-05-11 | Discharge: 2015-05-11 | Disposition: A | Discharge status: Home / Self Care | Payer: Medicare Other | Source: Ambulatory Visit | Attending: Internal Medicine | Admitting: Internal Medicine

## 2015-05-11 DIAGNOSIS — Z1231 Encounter for screening mammogram for malignant neoplasm of breast: Secondary | ICD-10-CM

## 2015-06-01 ENCOUNTER — Other Ambulatory Visit: Payer: Self-pay | Admitting: *Deleted

## 2015-06-01 MED ORDER — WARFARIN SODIUM 5 MG PO TABS: ORAL_TABLET | ORAL | Status: DC

## 2015-06-07 ENCOUNTER — Ambulatory Visit (INDEPENDENT_AMBULATORY_CARE_PROVIDER_SITE_OTHER): Payer: Medicare Other | Admitting: *Deleted

## 2015-06-07 DIAGNOSIS — Z5181 Encounter for therapeutic drug level monitoring: Secondary | ICD-10-CM | POA: Diagnosis not present

## 2015-06-07 DIAGNOSIS — I4891 Unspecified atrial fibrillation: Secondary | ICD-10-CM | POA: Diagnosis not present

## 2015-06-07 DIAGNOSIS — Z7901 Long term (current) use of anticoagulants: Secondary | ICD-10-CM | POA: Diagnosis not present

## 2015-06-07 DIAGNOSIS — I4892 Unspecified atrial flutter: Secondary | ICD-10-CM | POA: Diagnosis not present

## 2015-06-07 LAB — POCT INR: INR: 2.2

## 2015-06-08 ENCOUNTER — Telehealth: Payer: Self-pay | Admitting: Internal Medicine

## 2015-06-08 DIAGNOSIS — Z79899 Other long term (current) drug therapy: Secondary | ICD-10-CM

## 2015-06-08 NOTE — Telephone Encounter (Signed)
Patient calls in reporting BLEE for 1 1/2 weeks.  States that Dr Jobe Igo increased her Lasix to 60 mg daily, but she has seen no improvement in swelling. Only 2 lb weight gain.  Denies SOB, pitting edema. Reviewed with DOD, Dr. Acie Fredrickson -- order to increase Lasix to 60 mg BID x 3 days.  Patient will have follow up lab work next Friday 8/26. She will call office next week if no improvement in swelling.

## 2015-06-08 NOTE — Telephone Encounter (Signed)
Pt c/o swelling: STAT is pt has developed SOB within 24 hours  1. How long have you been experiencing swelling?  6 DAYS  2. Where is the swelling located?  BOTH LEGS LEFT GREATER THAN RIGHT  3.  Are you currently taking a "fluid pill"?  60mg  of LASIX  4.  Are you currently SOB? NO  5.  Have you traveled recently? NO

## 2015-06-12 ENCOUNTER — Other Ambulatory Visit (INDEPENDENT_AMBULATORY_CARE_PROVIDER_SITE_OTHER): Payer: Medicare Other

## 2015-06-12 DIAGNOSIS — Z79899 Other long term (current) drug therapy: Secondary | ICD-10-CM

## 2015-06-12 LAB — BASIC METABOLIC PANEL
BUN: 14 mg/dL (ref 6–23)
CO2: 35 mEq/L — ABNORMAL HIGH (ref 19–32)
CREATININE: 0.67 mg/dL (ref 0.40–1.20)
Calcium: 9.2 mg/dL (ref 8.4–10.5)
Chloride: 96 mEq/L (ref 96–112)
GFR: 92.04 mL/min (ref >60.00)
Glucose, Bld: 255 mg/dL — ABNORMAL HIGH (ref 70–99)
Potassium: 3.3 mEq/L — ABNORMAL LOW (ref 3.5–5.1)
SODIUM: 138 meq/L (ref 135–145)

## 2015-06-15 ENCOUNTER — Telehealth: Payer: Self-pay | Admitting: Internal Medicine

## 2015-06-15 ENCOUNTER — Other Ambulatory Visit: Payer: Medicare Other

## 2015-06-15 NOTE — Telephone Encounter (Signed)
Pt c/o swelling: STAT is pt has developed SOB within 24 hours  1. How long have you been experiencing swelling? Week and a half  2. Where is the swelling located? Both ankles  3.  Are you currently taking a "fluid pill"?yes  4.  Are you currently SOB? no  5.  Have you traveled recently?no  Colletta [place] is requesting nurse to call the pt back at [phone], because the pt was on 2 way calling when she called in.

## 2015-06-15 NOTE — Telephone Encounter (Signed)
Pt did not answer at [phone].

## 2015-06-18 NOTE — Telephone Encounter (Signed)
Pt did not answer [phone]

## 2015-06-28 ENCOUNTER — Telehealth: Payer: Self-pay | Admitting: Internal Medicine

## 2015-06-28 NOTE — Telephone Encounter (Signed)
She just needed pt's last potassium level for reference. They will arrange f/u lab for the hypokalemia. She also tells me that patient called in for edema and they are going to increase her Lasix for 3/more days until her weight drops 5 pounds. She will fax lab results to [place] when they are completed

## 2015-06-28 NOTE — Telephone Encounter (Signed)
New message      Calling to get patients last potassium level.  Patient called their office to schedule a follow up lab but they need her lab report from [place]. She also want to take to the nurse

## 2015-07-03 ENCOUNTER — Telehealth: Payer: Self-pay | Admitting: Internal Medicine

## 2015-07-03 NOTE — Telephone Encounter (Signed)
I left a message for the patient to call. 

## 2015-07-03 NOTE — Telephone Encounter (Signed)
Pt c/o swelling: STAT is pt has developed SOB within 24 hours  1. How long have you been experiencing swelling? 1 month  2. Where is the swelling located? Stomach and feet  3.  Are you currently taking a "fluid pill"? Lasix 60mg  2x a day  .  Are you currently SOB? No  5.  Have you traveled recently?No   Please call pt to give advise per Byron calling w/pt on line.

## 2015-07-05 NOTE — Telephone Encounter (Signed)
Patient c/o ABD and BLE swelling for a month. Patient st Dr. Caryl Comes increased her lasix to 60 mg BID for three days recently.  She went back to 40 mg daily and quickly started to swell again. She called Dr. Jobe Igo, and he increased her Lasix back to 60 mg BID. She has been taking 60 BID for 2 days. She st the swelling in her legs decreased a little, but her ABD is still swollen. She also st she has SOB "with lots of walking." She is in [place] until next Wednesday and insists she is not in distress. She does not have a scale in [place], but st her weight usually fluctuates between 245 lbs and 252 lbs. He cannot tell if she has gained any. Most recent BP = 156/66. Instructed patient to take medications as directed and to continue elevating her legs. Patient knows Dr. Caryl Comes is not in the office today but his nurse will call back soon with recommendations.

## 2015-07-05 NOTE — Telephone Encounter (Signed)
Follow up ° ° °Pt returning your call °

## 2015-07-08 NOTE — Telephone Encounter (Signed)
She might try 80 bid for 3 days then change her stable dose to 60 bid  from 40 bid

## 2015-07-09 NOTE — Telephone Encounter (Signed)
I called and spoke with the patient to follow up on symptoms and make her aware of Dr. Olin Pia recommendations for her lasix. She reports she is still in [place] and will be home on Wednesday evening. She does still have some abdominal swelling and some lower extremity edema, but this seems to be stable for her. She states that she tried to increase the lasix to 60 mg BID per Dr. Jobe Igo and this didn't seem to help. I advised the patient that Dr. Caryl Comes has recommended her to increase lasix to 80 mg BID x 3 days, then adjust her maintenance dose to 60 mg BID. She stated at one point in our conversation that she didn't know what she would do, but then stated she would make the change. I advised her that her swelling may get worse as she travels back to [place], but to keep [place] updated on her symptoms if this does not seem to be working for her.  She is agreeable.

## 2015-07-12 ENCOUNTER — Telehealth: Payer: Self-pay | Admitting: *Deleted

## 2015-07-12 ENCOUNTER — Other Ambulatory Visit: Payer: Self-pay | Admitting: *Deleted

## 2015-07-12 ENCOUNTER — Other Ambulatory Visit (INDEPENDENT_AMBULATORY_CARE_PROVIDER_SITE_OTHER): Payer: Medicare Other

## 2015-07-12 ENCOUNTER — Ambulatory Visit (INDEPENDENT_AMBULATORY_CARE_PROVIDER_SITE_OTHER): Payer: Medicare Other | Admitting: *Deleted

## 2015-07-12 DIAGNOSIS — Z79899 Other long term (current) drug therapy: Secondary | ICD-10-CM | POA: Diagnosis not present

## 2015-07-12 DIAGNOSIS — Z7901 Long term (current) use of anticoagulants: Secondary | ICD-10-CM

## 2015-07-12 DIAGNOSIS — I4891 Unspecified atrial fibrillation: Secondary | ICD-10-CM

## 2015-07-12 DIAGNOSIS — I4892 Unspecified atrial flutter: Secondary | ICD-10-CM | POA: Diagnosis not present

## 2015-07-12 DIAGNOSIS — Z5181 Encounter for therapeutic drug level monitoring: Secondary | ICD-10-CM | POA: Diagnosis not present

## 2015-07-12 LAB — BASIC METABOLIC PANEL
BUN: 13 mg/dL (ref 6–23)
CALCIUM: 8.9 mg/dL (ref 8.4–10.5)
CO2: 31 meq/L (ref 19–32)
CREATININE: 0.66 mg/dL (ref 0.40–1.20)
Chloride: 99 mEq/L (ref 96–112)
GFR: 93.63 mL/min (ref >60.00)
Glucose, Bld: 309 mg/dL — ABNORMAL HIGH (ref 70–99)
Potassium: 3.6 mEq/L (ref 3.5–5.1)
Sodium: 138 mEq/L (ref 135–145)

## 2015-07-12 LAB — POCT INR: INR: 2.3

## 2015-07-12 NOTE — Telephone Encounter (Signed)
Patient comes in office today for lab draw.  She went to Dr. Ronnell Freshwater office but they stuck her 4 times and she did not want them to stick her anymore so she came her because she wanted Lanny Hurst to draw her labs. She is also concerned that Dr. Ronnell Freshwater office did not want her to increase Lasix because of her lab work but she could not remember why.  I made two attempts to reach their office while she was here and was hold for greater than 20 minutes with answering service.  Finally reached office and spoke with Learta Codding who tells me she isn't sure why pt could not increase Lasix because Dr Jobe Igo was not in to discuss.  She thinks is it secondary to hypokalemia. Informed her that we drew BMET her today and will review results with Dr. Caryl Comes next week. I also offered to fax them results once resulted. (fax to [phone]). While patient was here today she stated that she had no LE edema.  Informed patient to continue current Lasix dose and office will call her next week once lab reviewed by Dr. Caryl Comes.  Patient is agreeable and very appreciative for taking so much time to help with this.

## 2015-07-19 ENCOUNTER — Telehealth: Payer: Self-pay | Admitting: Internal Medicine

## 2015-07-19 NOTE — Telephone Encounter (Signed)
New Message  Pt requets to speak w/ RN concerning lab results/ Please call back and discuss.

## 2015-07-19 NOTE — Telephone Encounter (Signed)
I spoke with the patient and made her aware of her lab results. There has been some confusion with her dosing. She tells me today that she is taking lasix 40 mg once daily. She recently traveled to [place] and was told by Dr. Ronnell Freshwater office when she arrived there, to take lasix 60 mg once daily x 3 days. She reports she did this, but with little change in her swelling. She reports she is back on the lasix 40 mg once daily dose and is urinating frequently. However, her weight has trended from the 240's to the 250's over the last few months and she is having abdominal swelling over the last 3 weeks. Weight on 9/27- 254 lbs, 9/28- 254 lbs, 9/29- 256 lbs. She is not taking any potassium supplements. She is wearing support hose. She is controlling her sodium and fluid intake per her report. I have advised her I would have to re-visit this with Dr. Caryl Comes as I am uncertain as to what to do with her diuretics at this time. She recently had a K+ of 3.3 on the current dose. Repeat on 9/19 showed a K+ of 3.6. Will forward to Dr. Caryl Comes for review and will call the patient back with recommendations.

## 2015-07-24 NOTE — Telephone Encounter (Signed)
She will either need to come in and see one of our PAs or Dr. Jobe Igo for adjusting her diuretics it seems. Thanks

## 2015-07-24 NOTE — Telephone Encounter (Signed)
Follow Up   Pt called. Request a call back to determine if Dr. Caryl Comes has responded to her previous question and would also like to know her recent potassium levels.

## 2015-07-26 NOTE — Telephone Encounter (Signed)
Patient states she will follow up with Dr. Jobe Igo, to address diuretics, as she has a scheduled appt with him.  She request to fax Dr. Jobe Igo last BMET. (Faxed successfully to [phone]) She also tells me she had an episode of increased HR/palpitations that lasted about 20 minutes.  Back in NSR and feeling fine.  Advised to continue to monitor and call back if continues to reoccur, stay for prolonged periods of time, and/or symptoms arise. Patient verbalized understanding and agreeable to plan.  She also thanks me for all the help last time she was in the office.

## 2015-07-31 ENCOUNTER — Other Ambulatory Visit: Payer: Self-pay

## 2015-07-31 DIAGNOSIS — Z1231 Encounter for screening mammogram for malignant neoplasm of breast: Secondary | ICD-10-CM

## 2015-08-03 ENCOUNTER — Encounter (HOSPITAL_COMMUNITY): Payer: Self-pay

## 2015-08-03 ENCOUNTER — Emergency Department (HOSPITAL_COMMUNITY): Payer: Medicare Other

## 2015-08-03 ENCOUNTER — Emergency Department (HOSPITAL_COMMUNITY)
Admission: EM | Admit: 2015-08-03 | Discharge: 2015-08-03 | Disposition: A | Discharge status: Home / Self Care | Payer: Medicare Other | Attending: Emergency Medicine | Admitting: Emergency Medicine

## 2015-08-03 ENCOUNTER — Telehealth: Payer: Self-pay | Admitting: Internal Medicine

## 2015-08-03 DIAGNOSIS — E119 Type 2 diabetes mellitus without complications: Secondary | ICD-10-CM | POA: Insufficient documentation

## 2015-08-03 DIAGNOSIS — I480 Paroxysmal atrial fibrillation: Secondary | ICD-10-CM | POA: Diagnosis not present

## 2015-08-03 DIAGNOSIS — Z79899 Other long term (current) drug therapy: Secondary | ICD-10-CM | POA: Insufficient documentation

## 2015-08-03 DIAGNOSIS — I5032 Chronic diastolic (congestive) heart failure: Secondary | ICD-10-CM | POA: Diagnosis not present

## 2015-08-03 DIAGNOSIS — E785 Hyperlipidemia, unspecified: Secondary | ICD-10-CM | POA: Diagnosis not present

## 2015-08-03 DIAGNOSIS — M1990 Unspecified osteoarthritis, unspecified site: Secondary | ICD-10-CM | POA: Diagnosis not present

## 2015-08-03 DIAGNOSIS — N189 Chronic kidney disease, unspecified: Secondary | ICD-10-CM | POA: Insufficient documentation

## 2015-08-03 DIAGNOSIS — I129 Hypertensive chronic kidney disease with stage 1 through stage 4 chronic kidney disease, or unspecified chronic kidney disease: Secondary | ICD-10-CM | POA: Insufficient documentation

## 2015-08-03 DIAGNOSIS — E876 Hypokalemia: Secondary | ICD-10-CM | POA: Diagnosis not present

## 2015-08-03 DIAGNOSIS — Z7902 Long term (current) use of antithrombotics/antiplatelets: Secondary | ICD-10-CM | POA: Diagnosis not present

## 2015-08-03 DIAGNOSIS — M109 Gout, unspecified: Secondary | ICD-10-CM | POA: Diagnosis not present

## 2015-08-03 DIAGNOSIS — I4892 Unspecified atrial flutter: Secondary | ICD-10-CM | POA: Diagnosis not present

## 2015-08-03 DIAGNOSIS — G4733 Obstructive sleep apnea (adult) (pediatric): Secondary | ICD-10-CM | POA: Insufficient documentation

## 2015-08-03 DIAGNOSIS — R002 Palpitations: Secondary | ICD-10-CM | POA: Diagnosis present

## 2015-08-03 LAB — BASIC METABOLIC PANEL
ANION GAP: 11 (ref 5–15)
BUN: 10 mg/dL (ref 6–20)
CHLORIDE: 98 mmol/L — AB (ref 101–111)
CO2: 28 mmol/L (ref 22–32)
CREATININE: 0.69 mg/dL (ref 0.44–1.00)
Calcium: 8.9 mg/dL (ref 8.9–10.3)
GFR calc Af Amer: >60 mL/min (ref >60)
GFR calc non Af Amer: >60 mL/min (ref >60)
Glucose, Bld: 383 mg/dL — ABNORMAL HIGH (ref 65–99)
Potassium: 2.9 mmol/L — ABNORMAL LOW (ref 3.5–5.1)
Sodium: 137 mmol/L (ref 135–145)

## 2015-08-03 LAB — CBC WITH DIFFERENTIAL/PLATELET
Basophils Absolute: 0 10*3/uL (ref 0.0–0.1)
Basophils Relative: 0 %
Eosinophils Absolute: 0.3 10*3/uL (ref 0.0–0.7)
Eosinophils Relative: 3 %
HEMATOCRIT: 40.9 % (ref 36.0–46.0)
HEMOGLOBIN: 13 g/dL (ref 12.0–15.0)
LYMPHS PCT: 22 %
Lymphs Abs: 2.9 10*3/uL (ref 0.7–4.0)
MCH: 25 pg — ABNORMAL LOW (ref 26.0–34.0)
MCHC: 31.8 g/dL (ref 30.0–36.0)
MCV: 78.5 fL (ref 78.0–100.0)
MONOS PCT: 11 %
Monocytes Absolute: 1.5 10*3/uL — ABNORMAL HIGH (ref 0.1–1.0)
NEUTROS ABS: 8.6 10*3/uL — AB (ref 1.7–7.7)
Neutrophils Relative %: 64 %
Platelets: 264 10*3/uL (ref 150–400)
RBC: 5.21 MIL/uL — ABNORMAL HIGH (ref 3.87–5.11)
RDW: 16.5 % — ABNORMAL HIGH (ref 11.5–15.5)
WBC: 13.3 10*3/uL — ABNORMAL HIGH (ref 4.0–10.5)

## 2015-08-03 LAB — I-STAT TROPONIN, ED: TROPONIN I, POC: 0 ng/mL (ref 0.00–0.08)

## 2015-08-03 LAB — PROTIME-INR
INR: 2.48 — AB (ref 0.00–1.49)
Prothrombin Time: 26.6 seconds — ABNORMAL HIGH (ref 11.6–15.2)

## 2015-08-03 LAB — MAGNESIUM: Magnesium: 1.9 mg/dL (ref 1.7–2.4)

## 2015-08-03 MED ORDER — POTASSIUM CHLORIDE CRYS ER 20 MEQ PO TBCR
40.0000 meq | EXTENDED_RELEASE_TABLET | Freq: Two times a day (BID) | ORAL | Status: DC
Start: 2015-08-03 — End: 2015-12-19

## 2015-08-03 MED ORDER — POTASSIUM CHLORIDE CRYS ER 20 MEQ PO TBCR
60.0000 meq | EXTENDED_RELEASE_TABLET | Freq: Once | ORAL | Status: AC
  Administered 2015-08-03: 60 meq via ORAL
  Filled 2015-08-03: qty 3

## 2015-08-03 MED ORDER — POTASSIUM CHLORIDE 10 MEQ/100ML IV SOLN: 10.0000 meq | INTRAVENOUS | Status: DC

## 2015-08-03 NOTE — ED Provider Notes (Signed)
CSN: [id number]     Arrival date & time 08/03/15  0239 History   By signing my name below, I, [name], attest that this documentation has been prepared under the direction and in the presence of [name] Balls, MD. Electronically Signed: Forrestine [name], ED Scribe. 08/03/2015. 2:56 AM.   Chief Complaint  Patient presents with  . Palpitations   The history is provided by the patient. No language interpreter was used.    HPI Comments: [name] is a 74 y.o. female with a PMHx of HTN, DM, and hyperlipidemia who presents to the Emergency Department complaining of intermittent, ongoing palpitations that woke her from sleep at 1:00 AM this morning. Most recent episode lasted approximately 30 minutes prior to arrival. Denies any chest pain or shortness of breath. [name] denies any recent issues or difficulties with her medications. However, her Lasix was recently increased 1 month ago for increased pitting edema. No recent fever, chills, nausea, vomiting, or abdominal pain. [name] is on Coumadin daily.  She is followed by Dr. [name]- Cardiology   Past Medical History  Diagnosis Date  . Hypertension     a.  Renal arterial Dopplers 12/2011: 1-59% right renal artery stenosis  . Diabetes mellitus   . Hyperlipidemia   . Gout   . Paroxysmal Afib/Flutter     a. 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  . Diastolic CHF, chronic     a.  echo 2006 - ef 55-65%; mild diast dysfxn;    b. Echo 08/2011: Mild LVH, EF 60%;  c. 04/2013 Echo: EF 65-69%, mild conc LVH;  08/2014 Echo: EF 60-65%, mild-mod MR.  . Arthritis   . Morbid obesity   . Back pain   . Chronic kidney disease   . Obstructive sleep apnea on CPAP   . Chronic anticoagulation     due to aflutter   Past Surgical History  Procedure Laterality Date  . Cholecystectomy    . Total abdominal hysterectomy    . Appendectomy    . Tonsillectomy  1982  . Cardioversion  10/22/2011    Procedure: CARDIOVERSION;  Surgeon: [name], MD;  Location: [place];  Service: Cardiovascular;  Laterality: N/A;  . Cardioversion N/A 09/10/2011    Procedure: CARDIOVERSION;  Surgeon: [name], MD;  Location: [facility] CATH LAB;  Service: Cardiovascular;  Laterality: N/A;  . Atrial flutter ablation N/A 09/24/2011    Procedure: ATRIAL FLUTTER ABLATION;  Surgeon: [name], MD;  Location: [facility] CATH LAB;  Service: Cardiovascular;  Laterality: N/A;   Family History  Problem Relation Age of Onset  . Heart disease Father   . Hypertension Father   . Breast cancer Sister   . Cancer Sister     breast   Social History  Substance Use Topics  . Smoking status: Never Smoker   . Smokeless tobacco: Never Used  . Alcohol Use: No   OB History    No data available     Review of Systems  Constitutional: Negative for fever and chills.  Respiratory: Negative for cough and shortness of breath.   Cardiovascular: Positive for palpitations. Negative for chest pain.  Gastrointestinal: Negative for nausea, vomiting and abdominal pain.  Musculoskeletal: Negative for back pain.  Skin: Negative for rash.  Neurological: Negative for headaches.  Psychiatric/Behavioral: Negative for confusion.  All other systems reviewed and are negative.     Allergies  Review of patient's allergies indicates no known allergies.  Home Medications   Prior to  Admission medications   Medication Sig Start Date End Date Taking? Authorizing Provider  albuterol (PROVENTIL HFA;VENTOLIN HFA) 108 (90 BASE) MCG/ACT inhaler Inhale 1-2 puffs into the lungs every 4 (four) hours as needed for wheezing or shortness of breath.    Historical Provider, MD  allopurinol (ZYLOPRIM) 300 MG tablet Take 300 mg by mouth daily. Take one tablet  daily 07/07/13   Historical Provider, MD  amiodarone (PACERONE) 200 MG tablet Take 1 tablet (200 mg total) five days per week. 02/13/15   [name], MD  amLODipine (NORVASC) 5 MG tablet Take 1 tablet (5 mg total) by mouth daily. 01/09/15    [name], MD  cloNIDine (CATAPRES) 0.2 MG tablet Take 0.2 mg by mouth 2 (two) times daily.    Historical Provider, MD  furosemide (LASIX) 40 MG tablet take 1 & 1/2 tablets (60 mg) by mouth twice daily 07/09/15   [name], MD  ipratropium (ATROVENT HFA) 17 MCG/ACT inhaler Inhale 2 puffs into the lungs every 4 (four) hours as needed for wheezing.    Historical Provider, MD  levothyroxine (SYNTHROID, LEVOTHROID) 125 MCG tablet Take 125 mcg by mouth daily before breakfast.    Historical Provider, MD  linagliptin (TRADJENTA) 5 MG TABS tablet Take 1 tablet (5 mg total) by mouth daily. 10/05/13   [name], MD  lisinopril (PRINIVIL,ZESTRIL) 10 MG tablet Take 1 tablet (10 mg total) by mouth daily. 04/07/13   [name], MD  oxyCODONE-acetaminophen (PERCOCET) 5-325 MG per tablet Take 1-2 tablets by mouth every 4 (four) hours as needed for severe pain. 02/06/15   [name], MD  rosuvastatin (CRESTOR) 10 MG tablet Take 10 mg by mouth every evening.    Historical Provider, MD  spironolactone (ALDACTONE) 25 MG tablet Take 1 tablet (25 mg total) by mouth daily. 12/28/14   [name], MD  warfarin (COUMADIN) 5 MG tablet Take as directed by coumadin clinic 06/01/15   [name], MD   Triage Vitals: Ht 5\' 1"  (1.549 m)  Wt 250 lb (113.399 kg)  BMI 47.26 kg/m2  SpO2 97%   Physical Exam  Constitutional: She is oriented to person, place, and time. She appears well-developed and well-nourished. No distress.  Obese   HENT:  Head: Normocephalic and atraumatic.  Nose: Nose normal.  Mouth/Throat: Oropharynx is clear and moist. No oropharyngeal exudate.  Eyes: Conjunctivae and EOM are normal. Pupils are equal, round, and reactive to light. No scleral icterus.  Neck: Normal range of motion. Neck supple. No JVD present. No tracheal deviation present. No thyromegaly present.  Cardiovascular: Normal rate, regular rhythm and normal heart sounds.  Exam reveals no gallop and no friction rub.    No murmur heard. Pulmonary/Chest: Effort normal and breath sounds normal. No respiratory distress. She has no wheezes. She exhibits no tenderness.  Abdominal: Soft. Bowel sounds are normal. She exhibits no distension and no mass. There is no tenderness. There is no rebound and no guarding.  Musculoskeletal: Normal range of motion. She exhibits no edema or tenderness.  Lymphadenopathy:    She has no cervical adenopathy.  Neurological: She is alert and oriented to person, place, and time. No cranial nerve deficit. She exhibits normal muscle tone.  Skin: Skin is warm and dry. No rash noted. No erythema. No pallor.  Nursing note and vitals reviewed.   ED Course  Procedures (including critical care time)  DIAGNOSTIC STUDIES: Oxygen Saturation is 97% on RA, Normal by my interpretation.  COORDINATION OF CARE: 2:42 AM- Will order PT-INR, CBC, BMP, Magnesium, and i-stat troponin. Discussed treatment plan with pt at bedside and pt agreed to plan.     Labs Review Labs Reviewed  CBC WITH DIFFERENTIAL/PLATELET - Abnormal; Notable for the following:    WBC 13.3 (*)    RBC 5.21 (*)    MCH 25.0 (*)    RDW 16.5 (*)    Neutro Abs 8.6 (*)    Monocytes Absolute 1.5 (*)    All other components within normal limits  BASIC METABOLIC PANEL - Abnormal; Notable for the following:    Potassium 2.9 (*)    Chloride 98 (*)    Glucose, Bld 383 (*)    All other components within normal limits  PROTIME-INR - Abnormal; Notable for the following:    Prothrombin Time 26.6 (*)    INR 2.48 (*)    All other components within normal limits  MAGNESIUM  I-STAT TROPOININ, ED    Imaging Review Dg Chest 2 View  08/03/2015  CLINICAL DATA:  Heart palpitations. EXAM: CHEST  2 VIEW COMPARISON:  11/10/2013 FINDINGS: Shallow inspiration with mild linear atelectasis in the lung bases. Normal heart size and pulmonary vascularity. No focal airspace disease or consolidation in the lungs. No blunting of costophrenic  angles. No pneumothorax. Calcification and torsion of the aorta. IMPRESSION: Shallow inspiration with atelectasis in the lung bases. No focal consolidation. Electronically Signed   By: Lucienne Capers M.D.   On: 08/03/2015 03:52   I have personally reviewed and evaluated these images and lab results as part of my medical decision-making.   EKG Interpretation   Date/Time:  Friday August 03 2015 03:06:08 EDT Ventricular Rate:  71 PR Interval:  179 QRS Duration: 91 QT Interval:  467 QTC Calculation: 508 R Axis:   27 Text Interpretation:  Sinus rhythm Borderline T abnormalities, anterior  leads Prolonged QT interval QT is now prolonged Confirmed by Glynn Octave [phone]) on 08/03/2015 3:41:46 AM      MDM   Final diagnoses:  None   Patient presents to the emergency department for palpitations. She recently had her Lasix increased to 60 mg. Potassium today is 2.9. She was given 60 mEq by mouth. Advised to increase her home potassium back to her normal regimen. This was recently decreased down to 10 mEq is. Primary care follow-up was advised within 3 days for repeat potassium check. Patient appears well in no acute distress, there has been no recurrence of her palpitations. Her vital signs remain within her normal limits and she is safe for discharge.   I, Sami Froh, personally performed the services described in this documentation. All medical record entries made by the scribe were at my direction and in my presence.  I have reviewed the chart and discharge instructions and agree that the record reflects my personal performance and is accurate and complete. Mallarie Voorhies.  08/03/2015. 4:29 AM.     [name] Balls, MD 08/03/15 [phone]

## 2015-08-03 NOTE — Discharge Instructions (Signed)
Hypokalemia [name], your potassium level today is 2.9. Go back to your potassium dosing of 40 mEq twice per day. See her primary care physician within 3 days for repeat potassium check. If any symptoms worsen come back to the emergency department immediately. Thank you. Hypokalemia means that the amount of potassium in the blood is lower than normal.Potassium is a chemical, called an electrolyte, that helps regulate the amount of fluid in the body. It also stimulates muscle contraction and helps nerves function properly.Most of the body's potassium is inside of cells, and only a very small amount is in the blood. Because the amount in the blood is so small, minor changes can be life-threatening. CAUSES  Antibiotics.  Diarrhea or vomiting.  Using laxatives too much, which can cause diarrhea.  Chronic kidney disease.  Water pills (diuretics).  Eating disorders (bulimia).  Low magnesium level.  Sweating a lot. SIGNS AND SYMPTOMS  Weakness.  Constipation.  Fatigue.  Muscle cramps.  Mental confusion.  Skipped heartbeats or irregular heartbeat (palpitations).  Tingling or numbness. DIAGNOSIS  Your health care provider can diagnose hypokalemia with blood tests. In addition to checking your potassium level, your health care provider may also check other lab tests. TREATMENT Hypokalemia can be treated with potassium supplements taken by mouth or adjustments in your current medicines. If your potassium level is very low, you may need to get potassium through a vein (IV) and be monitored in the hospital. A diet high in potassium is also helpful. Foods high in potassium are:  Nuts, such as peanuts and pistachios.  Seeds, such as sunflower seeds and pumpkin seeds.  Peas, lentils, and lima beans.  Whole grain and bran cereals and breads.  Fresh fruit and vegetables, such as apricots, avocado, bananas, cantaloupe, kiwi, oranges, tomatoes, asparagus, and potatoes.  Orange and  tomato juices.  Red meats.  Fruit yogurt. HOME CARE INSTRUCTIONS  Take all medicines as prescribed by your health care provider.  Maintain a healthy diet by including nutritious food, such as fruits, vegetables, nuts, whole grains, and lean meats.  If you are taking a laxative, be sure to follow the directions on the label. SEEK MEDICAL CARE IF:  Your weakness gets worse.  You feel your heart pounding or racing.  You are vomiting or having diarrhea.  You are diabetic and having trouble keeping your blood glucose in the normal range. SEEK IMMEDIATE MEDICAL CARE IF:  You have chest pain, shortness of breath, or dizziness.  You are vomiting or having diarrhea for more than 2 days.  You faint. MAKE SURE YOU:   Understand these instructions.  Will watch your condition.  Will get help right away if you are not doing well or get worse.   This information is not intended to replace advice given to you by your health care provider. Make sure you discuss any questions you have with your health care provider.   Document Released: 10/06/2005 Document Revised: 10/27/2014 Document Reviewed: 04/08/2013 Elsevier Interactive Patient Education [employer].

## 2015-08-03 NOTE — Telephone Encounter (Signed)
New message  Pt called request to have her blood drawn on Monday. Potassium was really low. Pt states that she was seen by the ED. She took 60 Mg of potassium at 5 am and she is supposed to take 40mg  per day until Monday.   Requesting a call back today to discuss receiving a lab on Monday.

## 2015-08-03 NOTE — ED Notes (Signed)
Pt comes from home via [facility], c/o palpations, started around 1am, lasting around 20 minutes,  hx of afib. No CP or SOB.

## 2015-08-03 NOTE — Telephone Encounter (Signed)
I spoke with the patient. She was in the ER last night and her K+ level was 2.9.  She called Dr. Ronnell Freshwater office to have to rechecked on Monday and they would not order this for her since the MD was not in the office. I advised her she could come by here on Monday and we can re-check this for her and forward to Dr. Jobe Igo. She is agreeable.

## 2015-08-06 ENCOUNTER — Other Ambulatory Visit (INDEPENDENT_AMBULATORY_CARE_PROVIDER_SITE_OTHER): Payer: Medicare Other | Admitting: *Deleted

## 2015-08-06 DIAGNOSIS — I5032 Chronic diastolic (congestive) heart failure: Secondary | ICD-10-CM | POA: Diagnosis not present

## 2015-08-06 DIAGNOSIS — E876 Hypokalemia: Secondary | ICD-10-CM

## 2015-08-06 LAB — BASIC METABOLIC PANEL
BUN: 15 mg/dL (ref 7–25)
CO2: 24 mmol/L (ref 20–31)
Calcium: 9 mg/dL (ref 8.6–10.4)
Chloride: 99 mmol/L (ref 98–110)
Creat: 0.77 mg/dL (ref 0.60–0.93)
GLUCOSE: 306 mg/dL — AB (ref 65–99)
Potassium: 4.5 mmol/L (ref 3.5–5.3)
SODIUM: 134 mmol/L — AB (ref 135–146)

## 2015-08-06 NOTE — Addendum Note (Signed)
Addended by: Eulis Foster on: 08/06/2015 11:53 AM   Modules accepted: Orders

## 2015-08-06 NOTE — Addendum Note (Signed)
Addended by: Eulis Foster on: 08/06/2015 11:56 AM   Modules accepted: Orders

## 2015-08-07 ENCOUNTER — Ambulatory Visit
Admission: RE | Admit: 2015-08-07 | Discharge: 2015-08-07 | Disposition: A | Discharge status: Home / Self Care | Payer: Medicare Other | Source: Ambulatory Visit

## 2015-08-07 ENCOUNTER — Other Ambulatory Visit: Payer: Self-pay

## 2015-08-07 ENCOUNTER — Telehealth: Payer: Self-pay | Admitting: Internal Medicine

## 2015-08-07 DIAGNOSIS — Z1231 Encounter for screening mammogram for malignant neoplasm of breast: Secondary | ICD-10-CM

## 2015-08-07 NOTE — Telephone Encounter (Signed)
I spoke with Liza at Dr. Ronnell Freshwater office and made her aware of BMP results.

## 2015-08-07 NOTE — Telephone Encounter (Signed)
New Message  RN from [facility] calling to speak w/ RN concerning pt's latest BMP. Please call back and discuss.

## 2015-08-16 ENCOUNTER — Ambulatory Visit (INDEPENDENT_AMBULATORY_CARE_PROVIDER_SITE_OTHER): Payer: Medicare Other | Admitting: *Deleted

## 2015-08-16 DIAGNOSIS — I4892 Unspecified atrial flutter: Secondary | ICD-10-CM

## 2015-08-16 DIAGNOSIS — Z5181 Encounter for therapeutic drug level monitoring: Secondary | ICD-10-CM

## 2015-08-16 DIAGNOSIS — Z7901 Long term (current) use of anticoagulants: Secondary | ICD-10-CM | POA: Diagnosis not present

## 2015-08-16 DIAGNOSIS — I4891 Unspecified atrial fibrillation: Secondary | ICD-10-CM

## 2015-08-16 LAB — POCT INR: INR: 2.6

## 2015-08-22 ENCOUNTER — Telehealth: Payer: Self-pay | Admitting: Internal Medicine

## 2015-08-22 NOTE — Telephone Encounter (Signed)
New Message   Pt is calling for BMP results

## 2015-08-22 NOTE — Telephone Encounter (Signed)
Left pt a message to call back. 

## 2015-08-23 NOTE — Telephone Encounter (Signed)
Patient informed of lab work results. Also informed patient that she was contacted on Monday by nurse and that her results have already been sent to her PCP. Patient stated that they've already contacted her r/e her elevated BS and repeating K+ level.

## 2015-09-27 ENCOUNTER — Ambulatory Visit (INDEPENDENT_AMBULATORY_CARE_PROVIDER_SITE_OTHER): Payer: Medicare Other | Admitting: *Deleted

## 2015-09-27 DIAGNOSIS — I4892 Unspecified atrial flutter: Secondary | ICD-10-CM | POA: Diagnosis not present

## 2015-09-27 DIAGNOSIS — Z7901 Long term (current) use of anticoagulants: Secondary | ICD-10-CM

## 2015-09-27 DIAGNOSIS — Z5181 Encounter for therapeutic drug level monitoring: Secondary | ICD-10-CM | POA: Diagnosis not present

## 2015-09-27 DIAGNOSIS — I4891 Unspecified atrial fibrillation: Secondary | ICD-10-CM

## 2015-09-27 LAB — POCT INR: INR: 2.3

## 2015-09-28 ENCOUNTER — Other Ambulatory Visit: Payer: Medicare Other

## 2015-10-03 ENCOUNTER — Other Ambulatory Visit (INDEPENDENT_AMBULATORY_CARE_PROVIDER_SITE_OTHER): Payer: Medicare Other | Admitting: *Deleted

## 2015-10-03 DIAGNOSIS — I10 Essential (primary) hypertension: Secondary | ICD-10-CM | POA: Diagnosis not present

## 2015-10-03 LAB — BASIC METABOLIC PANEL
BUN: 14 mg/dL (ref 7–25)
CO2: 30 mmol/L (ref 20–31)
Calcium: 9 mg/dL (ref 8.6–10.4)
Chloride: 95 mmol/L — ABNORMAL LOW (ref 98–110)
Creat: 0.7 mg/dL (ref 0.60–0.93)
Glucose, Bld: 290 mg/dL — ABNORMAL HIGH (ref 65–99)
POTASSIUM: 3.7 mmol/L (ref 3.5–5.3)
Sodium: 133 mmol/L — ABNORMAL LOW (ref 135–146)

## 2015-10-03 NOTE — Addendum Note (Signed)
Addended by: Eulis Foster on: 10/03/2015 12:49 PM   Modules accepted: Orders

## 2015-10-09 ENCOUNTER — Telehealth: Payer: Self-pay | Admitting: Internal Medicine

## 2015-10-09 NOTE — Telephone Encounter (Signed)
PT AWARE OF LAB RESULTS./CY 

## 2015-10-09 NOTE — Telephone Encounter (Signed)
F/u  Pt returning Rn phone call- lab work- Please call back and discuss.

## 2015-10-18 DIAGNOSIS — R609 Edema, unspecified: Secondary | ICD-10-CM | POA: Insufficient documentation

## 2015-10-18 DIAGNOSIS — E039 Hypothyroidism, unspecified: Secondary | ICD-10-CM | POA: Insufficient documentation

## 2015-10-23 ENCOUNTER — Other Ambulatory Visit: Payer: Self-pay | Admitting: *Deleted

## 2015-10-23 MED ORDER — WARFARIN SODIUM 5 MG PO TABS: ORAL_TABLET | ORAL | Status: DC

## 2015-10-25 ENCOUNTER — Other Ambulatory Visit: Payer: Self-pay | Admitting: Internal Medicine

## 2015-11-08 ENCOUNTER — Ambulatory Visit (INDEPENDENT_AMBULATORY_CARE_PROVIDER_SITE_OTHER): Payer: Medicare Other | Admitting: *Deleted

## 2015-11-08 DIAGNOSIS — Z5181 Encounter for therapeutic drug level monitoring: Secondary | ICD-10-CM

## 2015-11-08 DIAGNOSIS — I4891 Unspecified atrial fibrillation: Secondary | ICD-10-CM

## 2015-11-08 DIAGNOSIS — Z7901 Long term (current) use of anticoagulants: Secondary | ICD-10-CM | POA: Diagnosis not present

## 2015-11-08 DIAGNOSIS — I4892 Unspecified atrial flutter: Secondary | ICD-10-CM

## 2015-11-08 LAB — POCT INR: INR: 2.5

## 2015-12-11 ENCOUNTER — Encounter (HOSPITAL_COMMUNITY): Payer: Self-pay | Admitting: Emergency Medicine

## 2015-12-11 ENCOUNTER — Emergency Department (HOSPITAL_COMMUNITY): Payer: Medicare Other

## 2015-12-11 ENCOUNTER — Emergency Department (HOSPITAL_COMMUNITY)
Admission: EM | Admit: 2015-12-11 | Discharge: 2015-12-11 | Disposition: A | Discharge status: Home / Self Care | Payer: Medicare Other | Attending: Emergency Medicine | Admitting: Emergency Medicine

## 2015-12-11 DIAGNOSIS — I480 Paroxysmal atrial fibrillation: Secondary | ICD-10-CM | POA: Diagnosis not present

## 2015-12-11 DIAGNOSIS — N189 Chronic kidney disease, unspecified: Secondary | ICD-10-CM | POA: Insufficient documentation

## 2015-12-11 DIAGNOSIS — R002 Palpitations: Secondary | ICD-10-CM

## 2015-12-11 DIAGNOSIS — E1165 Type 2 diabetes mellitus with hyperglycemia: Secondary | ICD-10-CM | POA: Diagnosis not present

## 2015-12-11 DIAGNOSIS — E785 Hyperlipidemia, unspecified: Secondary | ICD-10-CM | POA: Insufficient documentation

## 2015-12-11 DIAGNOSIS — G4733 Obstructive sleep apnea (adult) (pediatric): Secondary | ICD-10-CM | POA: Diagnosis not present

## 2015-12-11 DIAGNOSIS — Z7901 Long term (current) use of anticoagulants: Secondary | ICD-10-CM | POA: Diagnosis not present

## 2015-12-11 DIAGNOSIS — Z79899 Other long term (current) drug therapy: Secondary | ICD-10-CM | POA: Diagnosis not present

## 2015-12-11 DIAGNOSIS — Z794 Long term (current) use of insulin: Secondary | ICD-10-CM | POA: Diagnosis not present

## 2015-12-11 DIAGNOSIS — I5032 Chronic diastolic (congestive) heart failure: Secondary | ICD-10-CM | POA: Diagnosis not present

## 2015-12-11 DIAGNOSIS — I129 Hypertensive chronic kidney disease with stage 1 through stage 4 chronic kidney disease, or unspecified chronic kidney disease: Secondary | ICD-10-CM | POA: Insufficient documentation

## 2015-12-11 DIAGNOSIS — R739 Hyperglycemia, unspecified: Secondary | ICD-10-CM

## 2015-12-11 LAB — COMPREHENSIVE METABOLIC PANEL
ALBUMIN: 3.4 g/dL — AB (ref 3.5–5.0)
ALK PHOS: 146 U/L — AB (ref 38–126)
ALT: 22 U/L (ref 14–54)
AST: 28 U/L (ref 15–41)
Anion gap: 12 (ref 5–15)
BILIRUBIN TOTAL: 0.9 mg/dL (ref 0.3–1.2)
BUN: 12 mg/dL (ref 6–20)
CO2: 25 mmol/L (ref 22–32)
Calcium: 8.9 mg/dL (ref 8.9–10.3)
Chloride: 104 mmol/L (ref 101–111)
Creatinine, Ser: 0.61 mg/dL (ref 0.44–1.00)
GFR calc Af Amer: >60 mL/min (ref >60)
GFR calc non Af Amer: >60 mL/min (ref >60)
GLUCOSE: 274 mg/dL — AB (ref 65–99)
POTASSIUM: 3.4 mmol/L — AB (ref 3.5–5.1)
Sodium: 141 mmol/L (ref 135–145)
TOTAL PROTEIN: 7 g/dL (ref 6.5–8.1)

## 2015-12-11 LAB — URINE MICROSCOPIC-ADD ON

## 2015-12-11 LAB — I-STAT CHEM 8, ED
BUN: 14 mg/dL (ref 6–20)
CHLORIDE: 101 mmol/L (ref 101–111)
Calcium, Ion: 1.03 mmol/L — ABNORMAL LOW (ref 1.13–1.30)
Creatinine, Ser: 0.5 mg/dL (ref 0.44–1.00)
Glucose, Bld: 277 mg/dL — ABNORMAL HIGH (ref 65–99)
HEMATOCRIT: 50 % — AB (ref 36.0–46.0)
Hemoglobin: 17 g/dL — ABNORMAL HIGH (ref 12.0–15.0)
POTASSIUM: 3.4 mmol/L — AB (ref 3.5–5.1)
SODIUM: 142 mmol/L (ref 135–145)
TCO2: 27 mmol/L (ref 0–100)

## 2015-12-11 LAB — I-STAT TROPONIN, ED: Troponin i, poc: 0 ng/mL (ref 0.00–0.08)

## 2015-12-11 LAB — PROTIME-INR
INR: 1.82 — ABNORMAL HIGH (ref 0.00–1.49)
Prothrombin Time: 21 seconds — ABNORMAL HIGH (ref 11.6–15.2)

## 2015-12-11 LAB — URINALYSIS, ROUTINE W REFLEX MICROSCOPIC
Bilirubin Urine: NEGATIVE
GLUCOSE, UA: 250 mg/dL — AB
HGB URINE DIPSTICK: NEGATIVE
Ketones, ur: NEGATIVE mg/dL
Leukocytes, UA: NEGATIVE
Nitrite: NEGATIVE
PROTEIN: 30 mg/dL — AB
SPECIFIC GRAVITY, URINE: 1.008 (ref 1.005–1.030)
pH: 7.5 (ref 5.0–8.0)

## 2015-12-11 LAB — CBC
HEMATOCRIT: 44.1 % (ref 36.0–46.0)
Hemoglobin: 13.7 g/dL (ref 12.0–15.0)
MCH: 24.6 pg — AB (ref 26.0–34.0)
MCHC: 31.1 g/dL (ref 30.0–36.0)
MCV: 79 fL (ref 78.0–100.0)
Platelets: 289 10*3/uL (ref 150–400)
RBC: 5.58 MIL/uL — ABNORMAL HIGH (ref 3.87–5.11)
RDW: 15.8 % — AB (ref 11.5–15.5)
WBC: 11.9 10*3/uL — ABNORMAL HIGH (ref 4.0–10.5)

## 2015-12-11 LAB — CBG MONITORING, ED
GLUCOSE-CAPILLARY: 274 mg/dL — AB (ref 65–99)
GLUCOSE-CAPILLARY: 178 mg/dL — AB (ref 65–99)

## 2015-12-11 LAB — BRAIN NATRIURETIC PEPTIDE: B Natriuretic Peptide: 165.3 pg/mL — ABNORMAL HIGH (ref 0.0–100.0)

## 2015-12-11 MED ORDER — INSULIN ASPART 100 UNIT/ML ~~LOC~~ SOLN
6.0000 [IU] | Freq: Once | SUBCUTANEOUS | Status: AC
  Administered 2015-12-11: 6 [IU] via INTRAVENOUS
  Filled 2015-12-11: qty 1

## 2015-12-11 MED ORDER — WARFARIN SODIUM 5 MG PO TABS
5.0000 mg | ORAL_TABLET | Freq: Once | ORAL | Status: AC
  Administered 2015-12-11: 5 mg via ORAL
  Filled 2015-12-11: qty 1

## 2015-12-11 MED ORDER — METOPROLOL TARTRATE 1 MG/ML IV SOLN
5.0000 mg | Freq: Once | INTRAVENOUS | Status: AC
  Administered 2015-12-11: 5 mg via INTRAVENOUS
  Filled 2015-12-11: qty 5

## 2015-12-11 MED ORDER — SODIUM CHLORIDE 0.9 % IV BOLUS (SEPSIS)
500.0000 mL | Freq: Once | INTRAVENOUS | Status: AC
  Administered 2015-12-11: 500 mL via INTRAVENOUS

## 2015-12-11 MED ORDER — CLONIDINE HCL 0.2 MG PO TABS
0.2000 mg | ORAL_TABLET | Freq: Once | ORAL | Status: AC
  Administered 2015-12-11: 0.2 mg via ORAL
  Filled 2015-12-11: qty 1

## 2015-12-11 NOTE — ED Provider Notes (Signed)
CSN: [id number]     Arrival date & time 12/11/15  [id number] History   First MD Initiated Contact with Patient 12/11/15 [phone]     Chief Complaint  Patient presents with  . Atrial Fibrillation     (Consider location/radiation/quality/duration/timing/severity/associated sxs/prior Treatment) HPI This is a 75 year old female with a history of paroxysmal atrial fibrillation, hypertension, and recently diagnosed diabetes comes in today stating that she woke up during the night and felt palpitations. She came in secondary to this. She states that she was recently started on Levemir told that her blood sugar continued to be high. She was told symptoms to be evaluated for which included being shaky. She initially thought that her blood sugar might be low but she took it and noted that it was high. She comes in secondary to the palpitations and shakiness. She is on Coumadin and has been taking it as per usual. She does not know when her last INR was checked. She denies any chest pain, dyspnea, syncope, abdominal pain, nausea, or vomiting. She has not noted any blood in stool or urine. Past Medical History  Diagnosis Date  . Hypertension     a.  Renal arterial Dopplers 12/2011: 1-59% right renal artery stenosis  . Diabetes mellitus   . Hyperlipidemia   . Gout   . Paroxysmal Afib/Flutter     a. 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  . Diastolic CHF, chronic ([place])     a.  echo 2006 - ef 55-65%; mild diast dysfxn;    b. Echo 08/2011: Mild LVH, EF 60%;  c. 04/2013 Echo: EF 65-69%, mild conc LVH;  08/2014 Echo: EF 60-65%, mild-mod MR.  . Arthritis   . Morbid obesity ([place])   . Back pain   . Chronic kidney disease   . Obstructive sleep apnea on CPAP   . Chronic anticoagulation     due to aflutter   Past Surgical History  Procedure Laterality Date  . Cholecystectomy    . Total abdominal hysterectomy    . Appendectomy    . Tonsillectomy  1982  . Cardioversion  10/22/2011    Procedure: CARDIOVERSION;   Surgeon: [name], MD;  Location: [place];  Service: Cardiovascular;  Laterality: N/A;  . Cardioversion N/A 09/10/2011    Procedure: CARDIOVERSION;  Surgeon: [name], MD;  Location: [facility] CATH LAB;  Service: Cardiovascular;  Laterality: N/A;  . Atrial flutter ablation N/A 09/24/2011    Procedure: ATRIAL FLUTTER ABLATION;  Surgeon: [name], MD;  Location: [facility] CATH LAB;  Service: Cardiovascular;  Laterality: N/A;   Family History  Problem Relation Age of Onset  . Heart disease Father   . Hypertension Father   . Breast cancer Sister   . Cancer Sister     breast   Social History  Substance Use Topics  . Smoking status: Never Smoker   . Smokeless tobacco: Never Used  . Alcohol Use: No   OB History    No data available     Review of Systems  All other systems reviewed and are negative.     Allergies  Review of patient's allergies indicates no known allergies.  Home Medications   Prior to Admission medications   Medication Sig Start Date End Date Taking? Authorizing Provider  albuterol (PROVENTIL HFA;VENTOLIN HFA) 108 (90 BASE) MCG/ACT inhaler Inhale 1-2 puffs into the lungs every 4 (four) hours as needed for wheezing or shortness of breath.   Yes Historical Provider, MD  allopurinol (ZYLOPRIM) 300 MG  tablet Take 300 mg by mouth daily. Take one tablet  daily 07/07/13  Yes Historical Provider, MD  amiodarone (PACERONE) 200 MG tablet Take 1 tablet (200 mg total) five days per week. 02/13/15  Yes [name], MD  amLODipine (NORVASC) 5 MG tablet Take 1 tablet (5 mg total) by mouth daily. 01/09/15  Yes [name], MD  cloNIDine (CATAPRES) 0.2 MG tablet Take 0.2 mg by mouth 2 (two) times daily.   Yes Historical Provider, MD  furosemide (LASIX) 40 MG tablet Take 40 mg by mouth daily. take 1 & 1/2 tablets (60 mg) by mouth twice daily 07/09/15  Yes [name], MD  Insulin Detemir (LEVEMIR) 100 UNIT/ML Pen Inject 20 Units into the skin daily before breakfast.   Yes  Historical Provider, MD  ipratropium (ATROVENT HFA) 17 MCG/ACT inhaler Inhale 2 puffs into the lungs every 4 (four) hours as needed for wheezing.   Yes Historical Provider, MD  levothyroxine (SYNTHROID, LEVOTHROID) 125 MCG tablet Take 125 mcg by mouth daily before breakfast.   Yes Historical Provider, MD  linagliptin (TRADJENTA) 5 MG TABS tablet Take 1 tablet (5 mg total) by mouth daily. 10/05/13  Yes [name] P [name], MD  lisinopril (PRINIVIL,ZESTRIL) 10 MG tablet Take 1 tablet (10 mg total) by mouth daily. 04/07/13  Yes [name], MD  potassium chloride (MICRO-K) 10 MEQ CR capsule Take 10 mEq by mouth daily. 08/29/15  Yes Historical Provider, MD  RESTASIS 0.05 % ophthalmic emulsion Place 1 drop into both eyes 2 (two) times daily. 11/30/15  Yes Historical Provider, MD  rosuvastatin (CRESTOR) 10 MG tablet Take 10 mg by mouth every evening.   Yes Historical Provider, MD  warfarin (COUMADIN) 5 MG tablet Take as directed by coumadin clinic Patient taking differently: Take 2.5-5 mg by mouth See admin instructions. Pt takes 2.5mg  MWF, pt takes 5mg  T,Th,Sa,Su 10/23/15  Yes [name], MD  oxyCODONE-acetaminophen (PERCOCET) 5-325 MG per tablet Take 1-2 tablets by mouth every 4 (four) hours as needed for severe pain. Patient not taking: Reported on 12/11/2015 02/06/15   [name], MD  potassium chloride SA (K-DUR,KLOR-CON) 20 MEQ tablet Take 2 tablets (40 mEq total) by mouth 2 (two) times daily. Patient not taking: Reported on 12/11/2015 08/03/15   [name], MD  spironolactone (ALDACTONE) 25 MG tablet Take 1 tablet (25 mg total) by mouth daily. Patient not taking: Reported on 12/11/2015 12/28/14   [name], MD   BP 142/96 mmHg  Pulse 95  Temp(Src) 98.4 F (36.9 C) (Oral)  Resp 19  SpO2 95% Physical Exam  Constitutional: She is oriented to person, place, and time. She appears well-developed and well-nourished. No distress.  HENT:  Head: Normocephalic and atraumatic.  Right Ear:  External ear normal.  Left Ear: External ear normal.  Nose: Nose normal.  Mouth/Throat: Oropharynx is clear and moist.  Eyes: Conjunctivae and EOM are normal. Pupils are equal, round, and reactive to light.  Neck: Normal range of motion. Neck supple.  Cardiovascular: An irregularly irregular rhythm present. Tachycardia present.   Pulmonary/Chest: Effort normal and breath sounds normal.  Abdominal: Soft. Bowel sounds are normal.  Musculoskeletal: Normal range of motion. She exhibits no edema.  Neurological: She is alert and oriented to person, place, and time. She has normal reflexes.  Skin: Skin is warm and dry. She is not diaphoretic.  Psychiatric: She has a normal mood and affect.  Nursing note and vitals reviewed.   ED Course  Procedures (including  critical care time) Labs Review Labs Reviewed  CBC - Abnormal; Notable for the following:    WBC 11.9 (*)    RBC 5.58 (*)    MCH 24.6 (*)    RDW 15.8 (*)    All other components within normal limits  BRAIN NATRIURETIC PEPTIDE - Abnormal; Notable for the following:    B Natriuretic Peptide 165.3 (*)    All other components within normal limits  PROTIME-INR - Abnormal; Notable for the following:    Prothrombin Time 21.0 (*)    INR 1.82 (*)    All other components within normal limits  COMPREHENSIVE METABOLIC PANEL - Abnormal; Notable for the following:    Potassium 3.4 (*)    Glucose, Bld 274 (*)    Albumin 3.4 (*)    Alkaline Phosphatase 146 (*)    All other components within normal limits  URINALYSIS, ROUTINE W REFLEX MICROSCOPIC (NOT AT [facility]) - Abnormal; Notable for the following:    Glucose, UA 250 (*)    Protein, ur 30 (*)    All other components within normal limits  URINE MICROSCOPIC-ADD ON - Abnormal; Notable for the following:    Squamous Epithelial / LPF 0-5 (*)    Bacteria, UA FEW (*)    All other components within normal limits  I-STAT CHEM 8, ED - Abnormal; Notable for the following:    Potassium 3.4 (*)     Glucose, Bld 277 (*)    Calcium, Ion 1.03 (*)    Hemoglobin 17.0 (*)    HCT 50.0 (*)    All other components within normal limits  Randolm Idol, ED    Imaging Review Dg Chest 2 View  12/11/2015  CLINICAL DATA:  Palpitations.  Hypertension, diabetes. EXAM: CHEST  2 VIEW COMPARISON:  08/03/2015 FINDINGS: Mild cardiomegaly. Mild peribronchial thickening and interstitial prominence. No confluent opacities or effusions. No overt edema. No acute bony abnormality. IMPRESSION: Mild cardiomegaly.  Probable bronchitic changes. Electronically Signed   By: Rolm Baptise M.D.   On: 12/11/2015 07:18   I have personally reviewed and evaluated these images and lab results as part of my medical decision-making.   EKG Interpretation   Date/Time:  Tuesday December 11 2015 06:47:53 EST Ventricular Rate:  137 PR Interval:    QRS Duration: 95 QT Interval:  343 QTC Calculation: 518 R Axis:   68 Text Interpretation:  Atrial fibrillation Abnormal R-wave progression,  early transition Borderline ST depression, diffuse leads Prolonged QT  interval Pt now in atrial fibrillation when compared to prior EKG  Confirmed by WARD,  DO, KRISTEN [id number]) on 12/11/2015 6:54:13 AM Also  confirmed by WARD,  DO, KRISTEN [id number]), editor [place]  CCT, [place]  ([id number])  on 12/11/2015 7:14:37 AM      MDM   Final diagnoses:  Paroxysmal atrial fibrillation (HCC)  Hyperglycemia    Given Lopressor 5 mg IV, IV normal saline bolus. Insulin 6 units IV and continues to be hemodynamically stable. Heart rate has decreased to 95. We are waiting recheck blood sugar. She is thirsty and is being allowed to take in fluids by mouth. Subtherapeutic INR. Extra dose of Coumadin given here and patient will follow-up her primary care physician. Patient converted to normal sinus rhythm. Blood sugar decreased to 178. Patient to be discharged to home and will follow-up with her cardiologist and primary care doctor there. We discussed  return precautions and need for follow-up and she voices understanding.  Pattricia Boss, MD 12/11/15 [phone]

## 2015-12-11 NOTE — ED Notes (Signed)
Pt noted to be in normal sinus rhythm, EKG captured and given to EDP.

## 2015-12-11 NOTE — Discharge Instructions (Signed)
Your heart was in atrial fibrillation. Your blood was not thin enough. We are giving you an extra dose of Coumadin here. Please call your primary care doctor today to have your blood rechecked. Call your cardiologist for follow-up. Continue all your home medications.  Atrial Fibrillation Atrial fibrillation is a type of irregular or rapid heartbeat (arrhythmia). In atrial fibrillation, the heart quivers continuously in a chaotic pattern. This occurs when parts of the heart receive disorganized signals that make the heart unable to pump blood normally. This can increase the risk for stroke, heart failure, and other heart-related conditions. There are different types of atrial fibrillation, including:  Paroxysmal atrial fibrillation. This type starts suddenly, and it usually stops on its own shortly after it starts.  Persistent atrial fibrillation. This type often lasts longer than a week. It may stop on its own or with treatment.  Long-lasting persistent atrial fibrillation. This type lasts longer than 12 months.  Permanent atrial fibrillation. This type does not go away. Talk with your health care provider to learn about the type of atrial fibrillation that you have. CAUSES This condition is caused by some heart-related conditions or procedures, including:  A heart attack.  Coronary artery disease.  Heart failure.  Heart valve conditions.  High blood pressure.  Inflammation of the sac that surrounds the heart (pericarditis).  Heart surgery.  Certain heart rhythm disorders, such as Wolf-Parkinson-White syndrome. Other causes include:  Pneumonia.  Obstructive sleep apnea.  Blockage of an artery in the lungs (pulmonary embolism, or PE).  Lung cancer.  Chronic lung disease.  Thyroid problems, especially if the thyroid is overactive (hyperthyroidism).  Caffeine.  Excessive alcohol use or illegal drug use.  Use of some medicines, including certain decongestants and diet  pills. Sometimes, the cause cannot be found. RISK FACTORS This condition is more likely to develop in:  People who are older in age.  People who smoke.  People who have diabetes mellitus.  People who are overweight (obese).  Athletes who exercise vigorously. SYMPTOMS Symptoms of this condition include:  A feeling that your heart is beating rapidly or irregularly.  A feeling of discomfort or pain in your chest.  Shortness of breath.  Sudden light-headedness or weakness.  Getting tired easily during exercise. In some cases, there are no symptoms. DIAGNOSIS Your health care provider may be able to detect atrial fibrillation when taking your pulse. If detected, this condition may be diagnosed with:  An electrocardiogram (ECG).  A Holter monitor test that records your heartbeat patterns over a 24-hour period.  Transthoracic echocardiogram (TTE) to evaluate how blood flows through your heart.  Transesophageal echocardiogram (TEE) to view more detailed images of your heart.  A stress test.  Imaging tests, such as a CT scan or chest X-Setareh Rom.  Blood tests. TREATMENT The main goals of treatment are to prevent blood clots from forming and to keep your heart beating at a normal rate and rhythm. The type of treatment that you receive depends on many factors, such as your underlying medical conditions and how you feel when you are experiencing atrial fibrillation. This condition may be treated with:  Medicine to slow down the heart rate, bring the heart's rhythm back to normal, or prevent clots from forming.  Electrical cardioversion. This is a procedure that resets your heart's rhythm by delivering a controlled, low-energy shock to the heart through your skin.  Different types of ablation, such as catheter ablation, catheter ablation with pacemaker, or surgical ablation. These procedures destroy the  heart tissues that send abnormal signals. When the pacemaker is used, it is placed  under your skin to help your heart beat in a regular rhythm. HOME CARE INSTRUCTIONS  Take over-the counter and prescription medicines only as told by your health care provider.  If your health care provider prescribed a blood-thinning medicine (anticoagulant), take it exactly as told. Taking too much blood-thinning medicine can cause bleeding. If you do not take enough blood-thinning medicine, you will not have the protection that you need against stroke and other problems.  Do not use tobacco products, including cigarettes, chewing tobacco, and e-cigarettes. If you need help quitting, ask your health care provider.  If you have obstructive sleep apnea, manage your condition as told by your health care provider.  Do not drink alcohol.  Do not drink beverages that contain caffeine, such as coffee, soda, and tea.  Maintain a healthy weight. Do not use diet pills unless your health care provider approves. Diet pills may make heart problems worse.  Follow diet instructions as told by your health care provider.  Exercise regularly as told by your health care provider.  Keep all follow-up visits as told by your health care provider. This is important. PREVENTION  Avoid drinking beverages that contain caffeine or alcohol.  Avoid certain medicines, especially medicines that are used for breathing problems.  Avoid certain herbs and herbal medicines, such as those that contain ephedra or ginseng.  Do not use illegal drugs, such as cocaine and amphetamines.  Do not smoke.  Manage your high blood pressure. SEEK MEDICAL CARE IF:  You notice a change in the rate, rhythm, or strength of your heartbeat.  You are taking an anticoagulant and you notice increased bruising.  You tire more easily when you exercise or exert yourself. SEEK IMMEDIATE MEDICAL CARE IF:  You have chest pain, abdominal pain, sweating, or weakness.  You feel nauseous.  You notice blood in your vomit, bowel  movement, or urine.  You have shortness of breath.  You suddenly have swollen feet and ankles.  You feel dizzy.  You have sudden weakness or numbness of the face, arm, or leg, especially on one side of the body.  You have trouble speaking, trouble understanding, or both (aphasia).  Your face or your eyelid droops on one side. These symptoms may represent a serious problem that is an emergency. Do not wait to see if the symptoms will go away. Get medical help right away. Call your local emergency services (911 in the [place] Do not drive yourself to the hospital.   This information is not intended to replace advice given to you by your health care provider. Make sure you discuss any questions you have with your health care provider.   Document Released: 10/06/2005 Document Revised: 06/27/2015 Document Reviewed: 01/31/2015 Elsevier Interactive Patient Education [employer].

## 2015-12-11 NOTE — ED Notes (Signed)
Pt arrives via EMS with feelings as though her heart is racing, noted to be in AFIB, hx of the same. HR 140 per EMS. No SHOB, CP, nausea. Woke up with it around 0530 this morning. Coumadin patient.

## 2015-12-11 NOTE — ED Notes (Signed)
Attempted IV stick and blood draw with no success.

## 2015-12-19 ENCOUNTER — Encounter: Payer: Self-pay | Admitting: Pulmonary Disease

## 2015-12-19 ENCOUNTER — Ambulatory Visit (INDEPENDENT_AMBULATORY_CARE_PROVIDER_SITE_OTHER): Payer: Medicare Other | Admitting: Pulmonary Disease

## 2015-12-19 VITALS — BP 142/78 | HR 57 | Ht 61.0 in | Wt 235.0 lb

## 2015-12-19 DIAGNOSIS — R0600 Dyspnea, unspecified: Secondary | ICD-10-CM

## 2015-12-19 DIAGNOSIS — G4733 Obstructive sleep apnea (adult) (pediatric): Secondary | ICD-10-CM | POA: Diagnosis not present

## 2015-12-19 DIAGNOSIS — G4710 Hypersomnia, unspecified: Secondary | ICD-10-CM | POA: Insufficient documentation

## 2015-12-19 DIAGNOSIS — E669 Obesity, unspecified: Secondary | ICD-10-CM | POA: Diagnosis not present

## 2015-12-19 NOTE — Assessment & Plan Note (Signed)
Weight reduction. Diet modification.

## 2015-12-19 NOTE — Assessment & Plan Note (Signed)
Pt with dyspnea. Might be related to restrictive ventilatory defect or obesity. Will observe for now. Nonsmoker. May need PFTs, chest x-ray on follow-up.

## 2015-12-19 NOTE — Assessment & Plan Note (Signed)
Patient has moderate sleep apnea. Compliant. Through the years, her machine is not working well. Her machine is 73 years old. Sometimes, it does not deliver enough pressure. She is not getting the same benefit as she was 7 years ago.  Overall however, she feels benefit of CPAP therapy. Less hypersomnia.  Plan: 1. We will try to get a new CPAP machine. Plan for auto CPAP 5-15 cm water. If it's not approved by insurance, plan to get a home sleep study or a lab study. 2. We will order her CPAP supplies today. 3. Discussed with the patient good sleep hygiene. 4. Discussed with patient follow-up regarding sleep apnea. 5. Advised patient not to drive if sleepy.

## 2015-12-19 NOTE — Assessment & Plan Note (Signed)
Patient with hypersomnia. This affects some functionality. Plan to get a new cpap machine. We can get a download in her current machine as there is no card. Need to treat sleep apnea and make sure it's corrected.

## 2015-12-19 NOTE — Patient Instructions (Signed)
1. We'll order supplies for his CPAP machine.You  will need mask, tubings, filter is, SD card.  2. We will try to get you a new CPAP machine.  3. Let us know if you don't get supplies or a new CPAP machine in 2-3 weeks.  Return to clinic in 2 months   J. Shirl Harris, MD [facility] Pager [phone] After 3pm or if no response, call [phone] Office: [phone], Fax: [phone]

## 2015-12-19 NOTE — Progress Notes (Signed)
Subjective:    Patient ID: [name], female    DOB: 03-13-1941, 75 y.o.   MRN: [id number]  HPI  This is the case of [name], 75 y.o. Female, who is in the office today for follow up on: her OSA. She had a sleep study in 06/2011 with AHI of 20. She was seeing Dr. Gwenette Greet -- last f/u was 06/2013.   Patient has been using her CPAP machine. No issues with it. Although sometimes, machine does not deliver enough pressure. Her machine is 75 years old. Sometimes it is malfunctioning. For the most part, she feels better with CPAP use. Feels benefit of using it. Less snoring, hypersomnia.  Mask is 75 yrs old. She has not received supplies in several years.   No new medical issues since last seen by the office.   Review of Systems  Constitutional: Negative.        Slowly losing weight x 3 yrs. Intentional.   HENT: Negative.   Eyes: Negative.   Respiratory: Negative.   Cardiovascular: Negative.   Gastrointestinal: Negative.   Endocrine: Negative.   Genitourinary: Negative.   Musculoskeletal: Negative.   Allergic/Immunologic: Negative.   Neurological: Negative.   Hematological: Negative.   Psychiatric/Behavioral: Negative.   All other systems reviewed and are negative.       Past Medical History  Diagnosis Date  . Hypertension     a.  Renal arterial Dopplers 12/2011: 1-59% right renal artery stenosis  . Diabetes mellitus   . Hyperlipidemia   . Gout   . Paroxysmal Afib/Flutter     a. 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  . Diastolic CHF, chronic ([place])     a.  echo 2006 - ef 55-65%; mild diast dysfxn;    b. Echo 08/2011: Mild LVH, EF 60%;  c. 04/2013 Echo: EF 65-69%, mild conc LVH;  08/2014 Echo: EF 60-65%, mild-mod MR.  . Arthritis   . Morbid obesity ([place])   . Back pain   . Chronic kidney disease   . Obstructive sleep apnea on CPAP   . Chronic anticoagulation     due to aflutter     Family History  Problem Relation Age of Onset  . Heart disease Father   .  Hypertension Father   . Breast cancer Sister   . Cancer Sister     breast     Past Surgical History  Procedure Laterality Date  . Cholecystectomy    . Total abdominal hysterectomy    . Appendectomy    . Tonsillectomy  1982  . Cardioversion  10/22/2011    Procedure: CARDIOVERSION;  Surgeon: [name], MD;  Location: [facility];  Service: Cardiovascular;  Laterality: N/A;  . Cardioversion N/A 09/10/2011    Procedure: CARDIOVERSION;  Surgeon: [name], MD;  Location: [facility] CATH LAB;  Service: Cardiovascular;  Laterality: N/A;  . Atrial flutter ablation N/A 09/24/2011    Procedure: ATRIAL FLUTTER ABLATION;  Surgeon: [name], MD;  Location: [facility] CATH LAB;  Service: Cardiovascular;  Laterality: N/A;    Social History   Social History  . Marital Status: Married    Spouse Name: N/A  . Number of Children: Y  . Years of Education: N/A   Occupational History  . DISABILITY/housewife    Social History Main Topics  . Smoking status: Never Smoker   . Smokeless tobacco: Never Used  . Alcohol Use: No  . Drug Use: No  . Sexual Activity: Yes   Other Topics Concern  .  Not on file   Social History Narrative   Married with 3 sons. (-) smoke, ETOH.   No Known Allergies   Outpatient Prescriptions Prior to Visit  Medication Sig Dispense Refill  . albuterol (PROVENTIL HFA;VENTOLIN HFA) 108 (90 BASE) MCG/ACT inhaler Inhale 1-2 puffs into the lungs every 4 (four) hours as needed for wheezing or shortness of breath.    . allopurinol (ZYLOPRIM) 300 MG tablet Take 300 mg by mouth daily. Take one tablet  daily    . amiodarone (PACERONE) 200 MG tablet Take 1 tablet (200 mg total) five days per week. 20 tablet 6  . amLODipine (NORVASC) 5 MG tablet Take 1 tablet (5 mg total) by mouth daily. 30 tablet 6  . cloNIDine (CATAPRES) 0.2 MG tablet Take 0.2 mg by mouth 2 (two) times daily.    . furosemide (LASIX) 40 MG tablet Take 40 mg by mouth daily. take 1 & 1/2 tablets (60 mg) by mouth twice daily     . Insulin Detemir (LEVEMIR) 100 UNIT/ML Pen Inject 20 Units into the skin daily before breakfast.    . ipratropium (ATROVENT HFA) 17 MCG/ACT inhaler Inhale 2 puffs into the lungs every 4 (four) hours as needed for wheezing.    Marland Kitchen levothyroxine (SYNTHROID, LEVOTHROID) 125 MCG tablet Take 125 mcg by mouth daily before breakfast.    . linagliptin (TRADJENTA) 5 MG TABS tablet Take 1 tablet (5 mg total) by mouth daily. 30 tablet 3  . lisinopril (PRINIVIL,ZESTRIL) 10 MG tablet Take 1 tablet (10 mg total) by mouth daily. 30 tablet 0  . oxyCODONE-acetaminophen (PERCOCET) 5-325 MG per tablet Take 1-2 tablets by mouth every 4 (four) hours as needed for severe pain. 30 tablet 0  . potassium chloride (MICRO-K) 10 MEQ CR capsule Take 10 mEq by mouth daily.    . RESTASIS 0.05 % ophthalmic emulsion Place 1 drop into both eyes 2 (two) times daily.  0  . rosuvastatin (CRESTOR) 10 MG tablet Take 10 mg by mouth every evening.    . warfarin (COUMADIN) 5 MG tablet Take as directed by coumadin clinic (Patient taking differently: Take 2.5-5 mg by mouth See admin instructions. Pt takes 2.5mg  MWF, pt takes 5mg  T,Th,Sa,Su) 30 tablet 3  . potassium chloride SA (K-DUR,KLOR-CON) 20 MEQ tablet Take 2 tablets (40 mEq total) by mouth 2 (two) times daily. (Patient not taking: Reported on 12/11/2015) 20 tablet 0  . spironolactone (ALDACTONE) 25 MG tablet Take 1 tablet (25 mg total) by mouth daily. (Patient not taking: Reported on 12/11/2015) 30 tablet 6   No facility-administered medications prior to visit.   No orders of the defined types were placed in this encounter.     Objective:   Physical Exam  Vitals:  Filed Vitals:   12/19/15 1009  BP: 142/78  Pulse: 57  Height: 5\' 1"  (1.549 m)  Weight: 235 lb (106.595 kg)  SpO2: 96%    Constitutional/General:  Pleasant, well-nourished, well-developed, not in any distress,  Comfortably seating.  Well kempt  Body mass index is 44.43 kg/(m^2). Wt Readings from Last 3  Encounters:  12/19/15 235 lb (106.595 kg)  08/03/15 250 lb (113.399 kg)  02/22/15 248 lb 9.6 oz (112.764 kg)     Neck circumference: 17 inches HEENT: Pupils equal and reactive to light and accommodation. Anicteric sclerae. Normal nasal mucosa.   No oral  lesions,  mouth clear,  oropharynx clear, no postnasal drip. (-) Oral thrush. No dental caries.  Airway - Mallampati class III  Neck: No  masses. Midline trachea. No JVD, (-) LAD. (-) bruits appreciated.  Respiratory/Chest: Grossly normal chest. (-) deformity. (-) Accessory muscle use.  Symmetric expansion. (-) Tenderness on palpation.  Resonant on percussion.  Diminished BS on both lower lung zones. (-) wheezing, crackles, rhonchi (-) egophony  Cardiovascular: Regular rate and  rhythm, heart sounds normal, no murmur or gallops, no peripheral edema  Gastrointestinal:  Normal bowel sounds. Soft, non-tender. No hepatosplenomegaly.  (-) masses.   Musculoskeletal:  Normal muscle tone. Normal gait.   Extremities: Grossly normal. (-) clubbing, cyanosis.  (-) edema  Skin: (-) rash,lesions seen.   Neurological/Psychiatric : alert, oriented to time, place, person. Normal mood and affect            Assessment & Plan:  Obstructive sleep apnea Patient has moderate sleep apnea. Compliant. Through the years, her machine is not working well. Her machine is 75 years old. Sometimes, it does not deliver enough pressure. She is not getting the same benefit as she was 7 years ago.  Overall however, she feels benefit of CPAP therapy. Less hypersomnia.  Plan: 1. We will try to get a new CPAP machine. Plan for auto CPAP 5-15 cm water. If it's not approved by insurance, plan to get a home sleep study or a lab study. 2. We will order her CPAP supplies today. 3. Discussed with the patient good sleep hygiene. 4. Discussed with patient follow-up regarding sleep apnea. 5. Advised patient not to drive if sleepy.  Dyspnea Pt with dyspnea.  Might be related to restrictive ventilatory defect or obesity. Will observe for now. Nonsmoker. May need PFTs, chest x-ray on follow-up.  Obesity Weight reduction. Diet modification.  Hypersomnia Patient with hypersomnia. This affects some functionality. Plan to get a new cpap machine. We can get a download in her current machine as there is no card. Need to treat sleep apnea and make sure it's corrected.   F/U in 23mos.

## 2015-12-20 ENCOUNTER — Ambulatory Visit (INDEPENDENT_AMBULATORY_CARE_PROVIDER_SITE_OTHER): Payer: Medicare Other | Admitting: *Deleted

## 2015-12-20 DIAGNOSIS — Z7901 Long term (current) use of anticoagulants: Secondary | ICD-10-CM

## 2015-12-20 DIAGNOSIS — I4891 Unspecified atrial fibrillation: Secondary | ICD-10-CM

## 2015-12-20 DIAGNOSIS — I4892 Unspecified atrial flutter: Secondary | ICD-10-CM

## 2015-12-20 DIAGNOSIS — Z5181 Encounter for therapeutic drug level monitoring: Secondary | ICD-10-CM | POA: Diagnosis not present

## 2015-12-20 LAB — POCT INR: INR: 2.1

## 2015-12-25 ENCOUNTER — Telehealth: Payer: Self-pay | Admitting: Internal Medicine

## 2015-12-25 NOTE — Telephone Encounter (Signed)
New Message  Pt with [facility] nurse called in on multiple line. States that the pt was in the ER for Afib and elevated BP. Pt request a sooner appt than 01/07/2016 with Caryl Comes. resch appt on flex with klein in office for 12/27/2015. Pt is requesting a call back to discuss more before appt

## 2015-12-26 NOTE — Telephone Encounter (Signed)
Spoke with pt, confirmed appt for tomorrow.

## 2015-12-26 NOTE — Addendum Note (Signed)
Addended by: Wynn Banker H on: 12/26/2015 11:43 AM   Modules accepted: Orders

## 2015-12-27 ENCOUNTER — Ambulatory Visit (INDEPENDENT_AMBULATORY_CARE_PROVIDER_SITE_OTHER): Payer: Medicare Other | Admitting: Cardiology

## 2015-12-27 ENCOUNTER — Telehealth: Payer: Self-pay | Admitting: *Deleted

## 2015-12-27 ENCOUNTER — Encounter: Payer: Self-pay | Admitting: Cardiology

## 2015-12-27 VITALS — BP 150/72 | HR 60 | Ht 61.0 in | Wt 240.0 lb

## 2015-12-27 DIAGNOSIS — Z79899 Other long term (current) drug therapy: Secondary | ICD-10-CM

## 2015-12-27 DIAGNOSIS — I10 Essential (primary) hypertension: Secondary | ICD-10-CM

## 2015-12-27 DIAGNOSIS — I4891 Unspecified atrial fibrillation: Secondary | ICD-10-CM | POA: Diagnosis not present

## 2015-12-27 DIAGNOSIS — I480 Paroxysmal atrial fibrillation: Secondary | ICD-10-CM

## 2015-12-27 DIAGNOSIS — Z7901 Long term (current) use of anticoagulants: Secondary | ICD-10-CM

## 2015-12-27 MED ORDER — AMIODARONE HCL 200 MG PO TABS: ORAL_TABLET | ORAL | Status: DC

## 2015-12-27 NOTE — Patient Instructions (Addendum)
Medication Instructions:  Your physician has recommended you make the following change in your medication:  1.  START the Amiodarone 200 mg taking 1 tablet twice a day X's 7 days then take 1 tablet daily after   Labwork: None order  Testing/Procedures: None ordered  Follow-Up: Your physician recommends that you schedule a follow-up appointment in: [place]   Any Other Special Instructions Will Be Listed Below (If Applicable). [facility] # [phone] for a Primary Care Physician    If you need a refill on your cardiac medications before your next appointment, please call your pharmacy.

## 2015-12-27 NOTE — Telephone Encounter (Signed)
Lmptcb re: we need to repeat EKG after 2 doses Amiodarone.  Appt for pt to come in 1:30 12/28/15

## 2015-12-27 NOTE — Progress Notes (Signed)
Cardiology Office Note   Date:  12/27/2015   ID:  [name], DOB October 30, 1940, MRN [id number]  PCP:  Marijean Bravo, MD  Cardiologist:  Dr. Caryl Comes    Chief Complaint  Patient presents with  . Hospitalization Follow-up    swelling in ankles,  runs of PAF      History of Present Illness: [name] is a 75 y.o. female who presents for post hospitalization for a fib. Seen in ER 12/11/15.  She was in a fib with HR 137 and given IV lopressor 5 mg IV with HR to 95.  Pt did convert to SR.  Her INR was sub therapeutic-- 1.82.  She was given an extra dose of coumadin.  On 12/20/15 her INR was 2.1.  She had another episode 12/18/15 EMS called but she slowed and converted with them so she did not go to the ER.     Last seen by Dr. Caryl Comes in 02/2015.  She has a hx of PAF, HTN, morbid obesity and diastolic HF.   She's been tried on flecainide, dofetilide, and most recently has been on amiodarone.  Echocardiogram November 2012 demonstrated normal left ventricular function with mild LVH; atrial chambers were also normal in size.  Her amiodarone was decreased in May to only 5 days a week at 200 mg.    Today she reports a lot of stress at home with family a sick grandchild and her father at 45 had a CVA and while he is in rehab he calls wanting to die because he is not independent.    Today no complaints and is in SB at 60. No chest pain and no SOB. She is on insulin now and is worried about this.  Her PCP follows her amiodarone labs.     Past Medical History  Diagnosis Date  . Hypertension     a.  Renal arterial Dopplers 12/2011: 1-59% right renal artery stenosis  . Diabetes mellitus   . Hyperlipidemia   . Gout   . Paroxysmal Afib/Flutter     a. 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  . Diastolic CHF, chronic ([place])     a.  echo 2006 - ef 55-65%; mild diast dysfxn;    b. Echo 08/2011: Mild LVH, EF 60%;  c. 04/2013 Echo: EF 65-69%, mild conc LVH;  08/2014 Echo: EF 60-65%, mild-mod MR.  .  Arthritis   . Morbid obesity ([place])   . Back pain   . Chronic kidney disease   . Obstructive sleep apnea on CPAP   . Chronic anticoagulation     due to aflutter    Past Surgical History  Procedure Laterality Date  . Cholecystectomy    . Total abdominal hysterectomy    . Appendectomy    . Tonsillectomy  1982  . Cardioversion  10/22/2011    Procedure: CARDIOVERSION;  Surgeon: [name], MD;  Location: [facility];  Service: Cardiovascular;  Laterality: N/A;  . Cardioversion N/A 09/10/2011    Procedure: CARDIOVERSION;  Surgeon: [name], MD;  Location: [facility] CATH LAB;  Service: Cardiovascular;  Laterality: N/A;  . Atrial flutter ablation N/A 09/24/2011    Procedure: ATRIAL FLUTTER ABLATION;  Surgeon: [name], MD;  Location[facility] C[facility] LAB;  Service: Cardiovascular;  Laterality: N/A;     Current Outpatient Prescriptions  Medication Sig Dispense Refill  . albuterol (PROVENTIL HFA;VENTOLIN HFA) 108 (90 BASE) MCG/ACT inhaler Inhale 1-2 puffs into the lungs every 4 (four) hours as needed for wheezing or  shortness of breath.    . allopurinol (ZYLOPRIM) 300 MG tablet Take 300 mg by mouth daily. Take one tablet  daily    . amiodarone (PACERONE) 200 MG tablet Take 1 tablet by mouth twice a day X's 7 days then take 1 tablet by mouth daily 60 tablet 6  . amLODipine (NORVASC) 5 MG tablet Take 1 tablet (5 mg total) by mouth daily. 30 tablet 6  . cloNIDine (CATAPRES) 0.2 MG tablet Take 0.2 mg by mouth 2 (two) times daily.    . furosemide (LASIX) 40 MG tablet Take 40 mg by mouth daily. take 1 & 1/2 tablets (60 mg) by mouth twice daily    . Insulin Detemir (LEVEMIR) 100 UNIT/ML Pen Inject 20 Units into the skin daily before breakfast.    . Insulin Pen Needle 31G X 5 MM MISC by Does not apply route as directed.    Marland Kitchen ipratropium (ATROVENT HFA) 17 MCG/ACT inhaler Inhale 2 puffs into the lungs every 4 (four) hours as needed for wheezing.    Marland Kitchen levothyroxine (SYNTHROID, LEVOTHROID) 125 MCG tablet Take  125 mcg by mouth daily before breakfast.    . linagliptin (TRADJENTA) 5 MG TABS tablet Take 1 tablet (5 mg total) by mouth daily. 30 tablet 3  . lisinopril (PRINIVIL,ZESTRIL) 10 MG tablet Take 1 tablet (10 mg total) by mouth daily. 30 tablet 0  . potassium chloride (MICRO-K) 10 MEQ CR capsule Take 10 mEq by mouth daily.    . RESTASIS 0.05 % ophthalmic emulsion Place 1 drop into both eyes 2 (two) times daily.  0  . rosuvastatin (CRESTOR) 10 MG tablet Take 10 mg by mouth every evening.    . warfarin (COUMADIN) 5 MG tablet Take as directed by coumadin clinic (Patient taking differently: Take 2.5-5 mg by mouth See admin instructions. Pt takes 2.5mg  MWF, pt takes 5mg  T,Th,Sa,Su) 30 tablet 3   No current facility-administered medications for this visit.    Allergies:   Review of patient's allergies indicates no known allergies.    Social History:  The patient  reports that she has never smoked. She has never used smokeless tobacco. She reports that she does not drink alcohol or use illicit drugs.   Family History:  The patient's family history includes Breast cancer in her sister; Cancer in her sister; Heart disease in her father; Hypertension in her father. now CVA in father   ROS:  General:no colds or fevers, no weight changes Skin:no rashes or ulcers HEENT:no blurred vision, no congestion CV:see HPI PUL:see HPI GI:no diarrhea constipation or melena, no indigestion GU:no hematuria, no dysuria MS:no joint pain, no claudication Neuro:no syncope, no lightheadedness Endo:no diabetes, no thyroid disease Wt Readings from Last 3 Encounters:  12/27/15 240 lb (108.863 kg)  12/19/15 235 lb (106.595 kg)  08/03/15 250 lb (113.399 kg)     PHYSICAL EXAM: VS:  BP 150/72 mmHg  Pulse 60  Ht 5\' 1"  (1.549 m)  Wt 240 lb (108.863 kg)  BMI 45.37 kg/m2 , BMI Body mass index is 45.37 kg/(m^2). General:Pleasant affect, NAD Skin:Warm and dry, brisk capillary refill HEENT:normocephalic, sclera clear,  mucus membranes moist Neck:supple, no JVD, no bruits  Heart:S1S2 RRR without murmur, gallup, rub or click Lungs:clear without rales, rhonchi, or wheezes [id number], non tender, + BS, do not palpate liver spleen or masses Ext:no lower ext edema, 2+ pedal pulses, 2+ radial pulses Neuro:alert and oriented, MAE, follows commands, + facial symmetry    EKG:  EKG is ordered today. The ekg  ordered today demonstrates SB at 70 PR 188/QRS 72/.Qtc 47   Recent Labs: 08/03/2015: Magnesium 1.9 12/11/2015: ALT 22; B Natriuretic Peptide 165.3*; BUN 12; BUN 14; Creatinine, Ser 0.61; Creatinine, Ser 0.50; Hemoglobin 17.0*; Platelets 289; Potassium 3.4*; Potassium 3.4*; Sodium 141; Sodium 142    Lipid Panel    Component Value Date/Time   CHOL 207* 04/06/2013 0459   TRIG 186* 04/06/2013 0459   HDL 58 04/06/2013 0459   CHOLHDL 3.6 04/06/2013 0459   VLDL 37 04/06/2013 0459   LDLCALC 112* 04/06/2013 0459       Other studies Reviewed: Additional studies/ records that were reviewed today include: . ECHO 2015 Study Conclusions  - Left ventricle: The cavity size was normal. Systolic function was normal. The estimated ejection fraction was in the range of 60% to 65%. Wall motion was normal; there were no regional wall motion abnormalities. Left ventricular diastolic function parameters were normal. - Mitral valve: There was mild to moderate regurgitation directed centrally and eccentrically. Valve area by continuity equation (using LVOT flow): 2.41 cm^2. - Left atrium: The atrium was moderately dilated. - Atrial septum: No defect or patent foramen ovale was identified.  Impressions:  - There is evidence of blunted systolic pulmonary vein atrial filling despite otherwise normal diastolic function parameters. Consider underestimation of mitral regurgitation severity due to the eccentric jet, especially in view of dilated left atrium without LV diastolic or systolic  dysfuncion.  Nuc 2014 no ischemia.   ASSESSMENT AND PLAN:  1.  PAF  Episode 12/11/15.  Treated in ER with lopressor 5 mg and pt slowed and converted to SR.  And again in 12/18/15 discussed with Dr. Caryl Comes will increase amiodarone to 200 BID for 1 week then 200 mg daily.  Will have her come back for repeat EKG to check Qtc.   2.  HTN controlled  3. HFpEF stable- euvolemic  4. Morbidly obese.      Current medicines are reviewed with the patient today.  The patient Has no concerns regarding medicines.  The following changes have been made:  See above Labs/ tests ordered today include:see above  Disposition:   FU:  see above  Lennie Muckle, NP  12/27/2015 5:36 PM    [facility] [place], [place], [place], [place] Phone: [phone]; Fax: [phone]

## 2015-12-28 ENCOUNTER — Telehealth: Payer: Self-pay | Admitting: *Deleted

## 2015-12-28 NOTE — Telephone Encounter (Signed)
Spoke with pt and she didn't start her amiodarone yesterday, so she will start it today and come in 12/31/15 for ekg.

## 2015-12-28 NOTE — Telephone Encounter (Signed)
Trying to reach pt re: repeat EKG for today after 2 doses of Amiodarone.  Left message on both #'s twice.Marland Kitchen Awaiting on pt to return my call

## 2015-12-30 ENCOUNTER — Other Ambulatory Visit: Payer: Self-pay | Admitting: Internal Medicine

## 2015-12-31 ENCOUNTER — Telehealth: Payer: Self-pay | Admitting: Pulmonary Disease

## 2015-12-31 ENCOUNTER — Ambulatory Visit (INDEPENDENT_AMBULATORY_CARE_PROVIDER_SITE_OTHER): Payer: Medicare Other | Admitting: *Deleted

## 2015-12-31 ENCOUNTER — Other Ambulatory Visit: Payer: Self-pay | Admitting: *Deleted

## 2015-12-31 ENCOUNTER — Other Ambulatory Visit: Payer: Medicare Other

## 2015-12-31 ENCOUNTER — Encounter: Payer: Self-pay | Admitting: *Deleted

## 2015-12-31 VITALS — BP 152/80 | HR 48 | Wt 236.0 lb

## 2015-12-31 DIAGNOSIS — Z79899 Other long term (current) drug therapy: Secondary | ICD-10-CM

## 2015-12-31 DIAGNOSIS — I4891 Unspecified atrial fibrillation: Secondary | ICD-10-CM

## 2015-12-31 DIAGNOSIS — M810 Age-related osteoporosis without current pathological fracture: Secondary | ICD-10-CM | POA: Insufficient documentation

## 2015-12-31 LAB — HEPATIC FUNCTION PANEL
ALK PHOS: 140 U/L — AB (ref 33–130)
ALT: 14 U/L (ref 6–29)
AST: 20 U/L (ref 10–35)
Albumin: 3.8 g/dL (ref 3.6–5.1)
BILIRUBIN DIRECT: 0.2 mg/dL (ref <=0.2)
BILIRUBIN INDIRECT: 0.4 mg/dL (ref 0.2–1.2)
TOTAL PROTEIN: 6.8 g/dL (ref 6.1–8.1)
Total Bilirubin: 0.6 mg/dL (ref 0.2–1.2)

## 2015-12-31 LAB — TSH: TSH: 0.64 m[IU]/L

## 2015-12-31 NOTE — Telephone Encounter (Signed)
ATC, line busy x 3 WCB

## 2015-12-31 NOTE — Patient Instructions (Signed)
Starting tomorrow take Amiodarone 200 mg daily.  Do not take today's PM dose.  You will be called with the lab results.  You will be called with the appointment with Dr. Caryl Comes.

## 2015-12-31 NOTE — Progress Notes (Signed)
1.) Reason for visit: EKG 2 days after starting Amiodarone 200 mg BID  2.) Name of MD requesting visit: Sandria Senter  3.) H&P:Hx of PAF  4.) ROS related to problem   She states she is not having any problems with the Amiodarone. Also had labs today; TSH and Liver panel  5.) Assessment and plan per MD: EKG performed and reviewed by Dr. Burt Knack (DOD); per Dr. Burt Knack will decrease Amiodarone from 200 mg BID x 7 days to 200 mg daily due to low heart rate.  Will follow up with Dr. Caryl Comes in one month. Pt verbalizes understanding.

## 2016-01-01 ENCOUNTER — Telehealth: Payer: Self-pay | Admitting: Internal Medicine

## 2016-01-01 NOTE — Telephone Encounter (Signed)
Pt states that when she was in for her nurse visit yesterday that she forgot to mention that she was previously only taking Amiodarone 200mg  M-F.  Pt states that she was told, starting today to only take Amiodarone 200mg  QD due to low heart rate per Dr. Burt Knack. Pt would like to know if she should take it everyday as instructed or go back to taking it just M-F. Advised pt that I will route this message to Cecilie Kicks, NP and Dr. Caryl Comes for review and advisement and once we hear back from one of them we will call with instructions.

## 2016-01-01 NOTE — Telephone Encounter (Signed)
Spoke with pt. States that she was contacted by [facility] yesterday, she has an appointment with them today. Nothing further was needed.

## 2016-01-01 NOTE — Telephone Encounter (Signed)
New message    Patient calling     Pt C/O medication issue:  1. Name of Medication: amiodarone  200 mg   2. How are you currently taking this medication (dosage and times per day)? Patient unsure was told to take medication 7 days   3. Are you having a reaction (difficulty breathing--STAT)?  No   4. What is your medication issue?  Need clarification on direction / dosage of medication was told different

## 2016-01-07 ENCOUNTER — Ambulatory Visit: Payer: Medicare Other | Admitting: Internal Medicine

## 2016-01-08 NOTE — Telephone Encounter (Signed)
Reviewed with Dr. Caryl Comes- OK to take amiodarone 200 mg once daily M-F. I left a message for the patient to call back to confirm.

## 2016-01-10 NOTE — Telephone Encounter (Signed)
I left a message of Dr. Olin Pia recommendations on the patient's voice mail regarding amiodarone 200 mg once daily M-F. I advised she can take her medication like this per Dr. Caryl Comes and to please call us back with any further questions or concerns.

## 2016-01-29 ENCOUNTER — Encounter: Payer: Self-pay | Admitting: Internal Medicine

## 2016-01-29 ENCOUNTER — Ambulatory Visit (INDEPENDENT_AMBULATORY_CARE_PROVIDER_SITE_OTHER): Payer: Medicare Other | Admitting: Internal Medicine

## 2016-01-29 ENCOUNTER — Ambulatory Visit (INDEPENDENT_AMBULATORY_CARE_PROVIDER_SITE_OTHER): Payer: Medicare Other | Admitting: *Deleted

## 2016-01-29 VITALS — BP 174/82 | HR 59 | Ht 61.0 in | Wt 241.8 lb

## 2016-01-29 DIAGNOSIS — I10 Essential (primary) hypertension: Secondary | ICD-10-CM

## 2016-01-29 DIAGNOSIS — I4892 Unspecified atrial flutter: Secondary | ICD-10-CM

## 2016-01-29 DIAGNOSIS — I480 Paroxysmal atrial fibrillation: Secondary | ICD-10-CM

## 2016-01-29 DIAGNOSIS — I5032 Chronic diastolic (congestive) heart failure: Secondary | ICD-10-CM | POA: Diagnosis not present

## 2016-01-29 DIAGNOSIS — I4891 Unspecified atrial fibrillation: Secondary | ICD-10-CM

## 2016-01-29 DIAGNOSIS — Z7901 Long term (current) use of anticoagulants: Secondary | ICD-10-CM | POA: Diagnosis not present

## 2016-01-29 DIAGNOSIS — Z5181 Encounter for therapeutic drug level monitoring: Secondary | ICD-10-CM

## 2016-01-29 DIAGNOSIS — I5030 Unspecified diastolic (congestive) heart failure: Secondary | ICD-10-CM | POA: Diagnosis not present

## 2016-01-29 LAB — POCT INR: INR: 1.9

## 2016-01-29 MED ORDER — SPIRONOLACTONE 25 MG PO TABS: 25.0000 mg | ORAL_TABLET | Freq: Every day | ORAL | Status: DC

## 2016-01-29 NOTE — Progress Notes (Signed)
Patient Care Team: No Pcp Per Patient as PCP - General (General Practice) Shawnee Knapp, MD as Resident (Family Medicine)   HPI  [name] is a 75 y.o. female seen in followup for atrial arrhythmias associated with diastolic heart failure. She's been tried on flecainide, dofetilide, and most recently has been on amiodarone. She's had a couple of intervening episodes of atrial fibrillation which in February prompted a visit to the emergency room.  We also have records from [place] where she developed hypokalemia   Echocardiogram November 2012 demonstrated normal left ventricular function with mild LVH; atrial chambers were also normal in size   Amiodarone surveillance labs are being followed by her primary care physician; Dr. Jobe Igo has recently moved   They were  up to date 3/17  Her thyroid recently readjusted.  Patient denies symptoms of GI intolerance, sun sensitivity, neurological symptoms attributable to amiodarone.     We had put her on Aldactone in the past because of edema and recurrent hypokalemia; somebody has stopped it. Most recent laboratories 2/17 again demonstrated hypokalemia       Past Medical History  Diagnosis Date  . Hypertension     a.  Renal arterial Dopplers 12/2011: 1-59% right renal artery stenosis  . Diabetes mellitus   . Hyperlipidemia   . Gout   . Paroxysmal Afib/Flutter     a. 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  . Diastolic CHF, chronic ([place])     a.  echo 2006 - ef 55-65%; mild diast dysfxn;    b. Echo 08/2011: Mild LVH, EF 60%;  c. 04/2013 Echo: EF 65-69%, mild conc LVH;  08/2014 Echo: EF 60-65%, mild-mod MR.  . Arthritis   . Morbid obesity ([place])   . Back pain   . Chronic kidney disease   . Obstructive sleep apnea on CPAP   . Chronic anticoagulation     due to aflutter    Past Surgical History  Procedure Laterality Date  . Cholecystectomy    . Total abdominal hysterectomy    . Appendectomy    . Tonsillectomy  1982  .  Cardioversion  10/22/2011    Procedure: CARDIOVERSION;  Surgeon: [name], MD;  Location: [place];  Service: Cardiovascular;  Laterality: N/A;  . Cardioversion N/A 09/10/2011    Procedure: CARDIOVERSION;  Surgeon: [name], MD;  Location: [facility] CATH LAB;  Service: Cardiovascular;  Laterality: N/A;  . Atrial flutter ablation N/A 09/24/2011    Procedure: ATRIAL FLUTTER ABLATION;  Surgeon: [name], MD;  Location: [facility] CATH LAB;  Service: Cardiovascular;  Laterality: N/A;    Current Outpatient Prescriptions  Medication Sig Dispense Refill  . allopurinol (ZYLOPRIM) 300 MG tablet Take 300 mg by mouth daily. Take one tablet  daily    . amiodarone (PACERONE) 200 MG tablet Take 200 mg once daily M-F 60 tablet 6  . amLODipine (NORVASC) 5 MG tablet Take 1 tablet (5 mg total) by mouth daily. 30 tablet 6  . cloNIDine (CATAPRES) 0.2 MG tablet Take 0.2 mg by mouth 2 (two) times daily.    . furosemide (LASIX) 40 MG tablet Take 40 mg by mouth daily. take 1 & 1/2 tablets (60 mg) by mouth twice daily    . Insulin Detemir (LEVEMIR) 100 UNIT/ML Pen Inject 20 Units into the skin daily before breakfast.    . Insulin Pen Needle 31G X 5 MM MISC by Does not apply route as directed.    Marland Kitchen levothyroxine (SYNTHROID,  LEVOTHROID) 125 MCG tablet Take 125 mcg by mouth daily before breakfast.    . linagliptin (TRADJENTA) 5 MG TABS tablet Take 1 tablet (5 mg total) by mouth daily. 30 tablet 3  . lisinopril (PRINIVIL,ZESTRIL) 10 MG tablet Take 1 tablet (10 mg total) by mouth daily. 30 tablet 0  . RESTASIS 0.05 % ophthalmic emulsion Place 1 drop into both eyes 2 (two) times daily.  0  . rosuvastatin (CRESTOR) 10 MG tablet Take 10 mg by mouth every evening.    . warfarin (COUMADIN) 5 MG tablet Take as directed by coumadin clinic (Patient taking differently: Take 2.5-5 mg by mouth See admin instructions. Pt takes 2.5mg  MWF, pt takes 5mg  T,Th,Sa,Su) 30 tablet 3   No current facility-administered medications for this visit.      No Known Allergies  Review of Systems negative except from HPI and PMH  Physical Exam BP 174/82 mmHg  Pulse 59  Ht 5\' 1"  (1.549 m)  Wt 241 lb 12.8 oz (109.68 kg)  BMI 45.71 kg/m2 Well developed and Morbidly obese  in no acute distress HENT normal E scleral and icterus clear Neck Supple JVP flat; carotids brisk and full Clear to ausculation  Regular rate and rhythm, no murmurs gallops or rub Soft with active bowel sounds No clubbing cyanosis  No  Edema Alert and oriented, grossly normal motor and sensory function Skin Warm and Dry   ECG demonstrates sinus rhythm at 69 Intervals 20/07/41   Assessment and  Plan  Hypertension   Paroxysmal atrial fibrillation   HFpEF   Morbidly obese    BP is much improved  We will decrease her amiodarone from 200 7 days a week--5 days a week. Surveillance laboratories are being followed by Dr. Jobe Igo   BP  Elevated,  Will add aldactone for BP and hypokalemia  Euvolemic continue current meds  Holding sinus rhythm  Has lost weight   Encouraged to continue

## 2016-01-29 NOTE — Patient Instructions (Addendum)
Medication Instructions: 1) Start Spironolactone (aldactone) 25 mg one tablet by mouth once daily  Labwork: - Your physician recommends that you have lab work in: 2 weeks at your pulmonary appointment- BMP  Procedures/Testing: - none  Follow-Up: - Your physician wants you to follow-up in: 6 months with Tommye Standard- PA for Dr. Caryl Comes. You will receive a reminder letter in the mail two months in advance. If you don't receive a letter, please call our office to schedule the follow-up appointment.  Any Additional Special Instructions Will Be Listed Below (If Applicable).     If you need a refill on your cardiac medications before your next appointment, please call your pharmacy.

## 2016-02-12 ENCOUNTER — Ambulatory Visit (INDEPENDENT_AMBULATORY_CARE_PROVIDER_SITE_OTHER): Payer: Medicare Other | Admitting: Pulmonary Disease

## 2016-02-12 ENCOUNTER — Encounter: Payer: Self-pay | Admitting: Pulmonary Disease

## 2016-02-12 VITALS — BP 148/72 | HR 58 | Ht 61.0 in | Wt 240.0 lb

## 2016-02-12 DIAGNOSIS — R0600 Dyspnea, unspecified: Secondary | ICD-10-CM | POA: Diagnosis not present

## 2016-02-12 DIAGNOSIS — E669 Obesity, unspecified: Secondary | ICD-10-CM

## 2016-02-12 DIAGNOSIS — G4733 Obstructive sleep apnea (adult) (pediatric): Secondary | ICD-10-CM

## 2016-02-12 NOTE — Assessment & Plan Note (Signed)
Weight reduction 

## 2016-02-12 NOTE — Assessment & Plan Note (Signed)
Patient has moderate sleep apnea. Compliant. Through the years, her machine is not working well. Her machine is 73 years old. Sometimes, it does not deliver enough pressure. She is not getting the same benefit as she was 7 years ago.  Overall however, she feels benefit of CPAP therapy. Less hypersomnia.  Pt received a new cpap machine in 12/2015. Feels benefit of cpap machine. Feels better. More energy. Less sleepiness. No issues with cpap.   DL x 1 month > 80%, AHI 0.6. Plan : 1. Cont autocpap 5-15. 2. Change supplies frequently. 3. Call the office if with issues.

## 2016-02-12 NOTE — Assessment & Plan Note (Signed)
Pt with dyspnea. Might be related to restrictive ventilatory defect or obesity. Will observe for now. Nonsmoker. May need PFTs, chest x-ray on follow-up. SOB has been stable. Hold off on tests per pt.

## 2016-02-12 NOTE — Patient Instructions (Signed)
1. Cont cpap as ordered. 2. Call back if with issues.  Return to clinic in 1 yr.

## 2016-02-12 NOTE — Progress Notes (Signed)
Subjective:    Patient ID: [name], female    DOB: Aug 20, 1941, 75 y.o.   MRN: [id number]  HPI  This is the case of [name], 75 y.o. Female, who is in the office today for follow up on: her OSA. She had a sleep study in 06/2011 with AHI of 20. She was seeing Dr. Gwenette Greet -- last f/u was 06/2013.   Patient has been using her CPAP machine. No issues with it. Although sometimes, machine does not deliver enough pressure. Her machine is 75 years old. Sometimes it is malfunctioning. For the most part, she feels better with CPAP use. Feels benefit of using it. Less snoring, hypersomnia.  ROV (02/12/2016)  Pt is here as f/u on her osa and cpap machine.  Since last seen, she states she has been doing well. Uses cpap -- no issues with it. Feels better. More energy. Feels benefit of cpap.   Review of Systems  Constitutional: Negative.        Slowly losing weight x 3 yrs. Intentional.   HENT: Negative.   Eyes: Negative.   Respiratory: Negative.   Cardiovascular: Negative.   Gastrointestinal: Negative.   Endocrine: Negative.   Genitourinary: Negative.   Musculoskeletal: Negative.   Allergic/Immunologic: Negative.   Neurological: Negative.   Hematological: Negative.   Psychiatric/Behavioral: Negative.   All other systems reviewed and are negative.       Objective:   Physical Exam  Vitals:  Filed Vitals:   02/12/16 1205  BP: 148/72  Pulse: 58  Height: 5\' 1"  (1.549 m)  Weight: 240 lb (108.863 kg)  SpO2: 94%    Constitutional/General:  Pleasant, well-nourished, well-developed, not in any distress,  Comfortably seating.  Well kempt  Body mass index is 45.37 kg/(m^2). Wt Readings from Last 3 Encounters:  02/12/16 240 lb (108.863 kg)  01/29/16 241 lb 12.8 oz (109.68 kg)  12/31/15 236 lb (107.049 kg)     Neck circumference: 17 inches HEENT: Pupils equal and reactive to light and accommodation. Anicteric sclerae. Normal nasal mucosa.   No oral  lesions,  mouth clear,   oropharynx clear, no postnasal drip. (-) Oral thrush. No dental caries.  Airway - Mallampati class III  Neck: No masses. Midline trachea. No JVD, (-) LAD. (-) bruits appreciated.  Respiratory/Chest: Grossly normal chest. (-) deformity. (-) Accessory muscle use.  Symmetric expansion. (-) Tenderness on palpation.  Resonant on percussion.  Diminished BS on both lower lung zones. (-) wheezing, crackles, rhonchi (-) egophony  Cardiovascular: Regular rate and  rhythm, heart sounds normal, no murmur or gallops, no peripheral edema  Gastrointestinal:  Normal bowel sounds. Soft, non-tender. No hepatosplenomegaly.  (-) masses.   Musculoskeletal:  Normal muscle tone. Normal gait.   Extremities: Grossly normal. (-) clubbing, cyanosis.  (-) edema  Skin: (-) rash,lesions seen.   Neurological/Psychiatric : alert, oriented to time, place, person. Normal mood and affect            Assessment & Plan:  Obstructive sleep apnea Patient has moderate sleep apnea. Compliant. Through the years, her machine is not working well. Her machine is 73 years old. Sometimes, it does not deliver enough pressure. She is not getting the same benefit as she was 7 years ago.  Overall however, she feels benefit of CPAP therapy. Less hypersomnia.  Pt received a new cpap machine in 12/2015. Feels benefit of cpap machine. Feels better. More energy. Less sleepiness. No issues with cpap.   DL x 1 month > 80%, AHI 0.6.  Plan : 1. Cont autocpap 5-15. 2. Change supplies frequently. 3. Call the office if with issues.     Dyspnea Pt with dyspnea. Might be related to restrictive ventilatory defect or obesity. Will observe for now. Nonsmoker. May need PFTs, chest x-ray on follow-up. SOB has been stable. Hold off on tests per pt.     Obesity Weight reduction   F/U in 1 yr.  Monica Becton, MD 02/12/2016, 12:42 PM [facility] Pager [phone] After 3 pm or if no answer,  call [phone]

## 2016-02-13 ENCOUNTER — Telehealth: Payer: Self-pay | Admitting: Internal Medicine

## 2016-02-13 DIAGNOSIS — I480 Paroxysmal atrial fibrillation: Secondary | ICD-10-CM

## 2016-02-13 NOTE — Telephone Encounter (Signed)
Spoke with pt , she states that on her last office visit with Dr. Caryl Comes  prescribed Aldactone 25 mh once daily. After she went to peak the  medication up to the pharmacy she realized that it was the medication she had taken before and was discontinued, because she developed a itching which  lasted for several weeks until the medication was D/C. So pt was not going to take this medication. Pt states  the [facility] nurse had recommended for pt to take an extra 1/2 pill of lasix for her increased LE edema and added appropriate dose of potassium with it. Pt wants for Dr. Caryl Comes to know about these changes.

## 2016-02-13 NOTE — Telephone Encounter (Signed)
New Message    Colletta Maryland with [facility]  Called in with the pt on the line states that the Pt is c/o swelling: STAT is pt has developed SOB within 24 hours  1. How long have you been experiencing swelling? 3 days   2. Where is the swelling located? Legs and ankles   3.  Are you currently taking a "fluid pill"? Yes and has increased the dose    4.  Are you currently SOB? No   5.  Have you traveled recently? No   Pt c/o medication issue: 1. Name of Medication: lasix   4. What is your medication issue? Pt was taking an extra half tablet. And taking as needed potassium. The nurse with [facility] has advised her to take the potassium with the extra dose of lasix.   Pt c/o another medication issue: 1. Name of Medication: spironolactone (ALDACTONE) 25 MG tablet  4. What is your medication issue?  spironolactone (ALDACTONE) 25 MG tablet was prescribed at the last visit but the pt hasnt taken it because she has experienced the side effects of itching.   Comments: Colletta Maryland with [facility] requests that you call the pt back to discuss but she has left her follow up information and has faxed in the her readings as well.   Colletta Maryland with [employer]  [phone] EXT [phone]

## 2016-02-14 NOTE — Telephone Encounter (Signed)
Try on epleronone 25

## 2016-02-15 MED ORDER — EPLERENONE 25 MG PO TABS: 25.0000 mg | ORAL_TABLET | Freq: Every day | ORAL | Status: DC

## 2016-02-15 NOTE — Telephone Encounter (Signed)
The patient is aware of Dr. Olin Pia recommendations. She has already cut out the extra 1/2 tablet on her lasix. She will start epleronone 25 mg once daily. We will repeat a BMP on 02/25/16.

## 2016-02-18 ENCOUNTER — Ambulatory Visit: Payer: Medicare Other | Admitting: Pulmonary Disease

## 2016-02-25 ENCOUNTER — Other Ambulatory Visit (INDEPENDENT_AMBULATORY_CARE_PROVIDER_SITE_OTHER): Payer: Medicare Other | Admitting: *Deleted

## 2016-02-25 ENCOUNTER — Encounter: Payer: Self-pay | Admitting: Pulmonary Disease

## 2016-02-25 DIAGNOSIS — I480 Paroxysmal atrial fibrillation: Secondary | ICD-10-CM

## 2016-02-25 LAB — TSH: TSH: 0.1 mIU/L — ABNORMAL LOW

## 2016-02-25 LAB — BASIC METABOLIC PANEL
BUN: 15 mg/dL (ref 7–25)
CHLORIDE: 100 mmol/L (ref 98–110)
CO2: 32 mmol/L — AB (ref 20–31)
CREATININE: 0.56 mg/dL — AB (ref 0.60–0.93)
Calcium: 8.6 mg/dL (ref 8.6–10.4)
Glucose, Bld: 138 mg/dL — ABNORMAL HIGH (ref 65–99)
POTASSIUM: 3.8 mmol/L (ref 3.5–5.3)
Sodium: 142 mmol/L (ref 135–146)

## 2016-02-25 NOTE — Addendum Note (Signed)
Addended by: Eulis Foster on: 02/25/2016 10:18 AM   Modules accepted: Orders

## 2016-03-03 ENCOUNTER — Other Ambulatory Visit: Payer: Self-pay | Admitting: *Deleted

## 2016-03-03 DIAGNOSIS — E039 Hypothyroidism, unspecified: Secondary | ICD-10-CM

## 2016-03-03 MED ORDER — LEVOTHYROXINE SODIUM 100 MCG PO TABS: 100.0000 ug | ORAL_TABLET | Freq: Every day | ORAL | Status: DC

## 2016-03-08 ENCOUNTER — Other Ambulatory Visit: Payer: Self-pay | Admitting: Internal Medicine

## 2016-03-19 ENCOUNTER — Ambulatory Visit (INDEPENDENT_AMBULATORY_CARE_PROVIDER_SITE_OTHER): Payer: Medicare Other | Admitting: *Deleted

## 2016-03-19 ENCOUNTER — Other Ambulatory Visit: Payer: Self-pay | Admitting: Pharmacist

## 2016-03-19 DIAGNOSIS — Z5181 Encounter for therapeutic drug level monitoring: Secondary | ICD-10-CM | POA: Diagnosis not present

## 2016-03-19 DIAGNOSIS — I4892 Unspecified atrial flutter: Secondary | ICD-10-CM

## 2016-03-19 DIAGNOSIS — Z7901 Long term (current) use of anticoagulants: Secondary | ICD-10-CM | POA: Diagnosis not present

## 2016-03-19 DIAGNOSIS — I4891 Unspecified atrial fibrillation: Secondary | ICD-10-CM | POA: Diagnosis not present

## 2016-03-19 LAB — POCT INR: INR: 1.6

## 2016-03-19 MED ORDER — LISINOPRIL 20 MG PO TABS: 20.0000 mg | ORAL_TABLET | Freq: Every day | ORAL | Status: DC

## 2016-04-02 ENCOUNTER — Ambulatory Visit (INDEPENDENT_AMBULATORY_CARE_PROVIDER_SITE_OTHER): Payer: Medicare Other | Admitting: Pharmacist

## 2016-04-02 DIAGNOSIS — Z5181 Encounter for therapeutic drug level monitoring: Secondary | ICD-10-CM

## 2016-04-02 DIAGNOSIS — I4891 Unspecified atrial fibrillation: Secondary | ICD-10-CM

## 2016-04-02 LAB — POCT INR: INR: 2.2

## 2016-04-23 ENCOUNTER — Ambulatory Visit (INDEPENDENT_AMBULATORY_CARE_PROVIDER_SITE_OTHER): Payer: Medicare Other | Admitting: Pharmacist

## 2016-04-23 DIAGNOSIS — I4891 Unspecified atrial fibrillation: Secondary | ICD-10-CM | POA: Diagnosis not present

## 2016-04-23 DIAGNOSIS — Z5181 Encounter for therapeutic drug level monitoring: Secondary | ICD-10-CM

## 2016-04-23 LAB — POCT INR: INR: 2

## 2016-05-01 ENCOUNTER — Encounter (HOSPITAL_COMMUNITY): Payer: Self-pay

## 2016-05-01 ENCOUNTER — Emergency Department (HOSPITAL_COMMUNITY)
Admission: EM | Admit: 2016-05-01 | Discharge: 2016-05-01 | Disposition: A | Discharge status: Home / Self Care | Payer: Medicare Other | Attending: Emergency Medicine | Admitting: Emergency Medicine

## 2016-05-01 ENCOUNTER — Emergency Department (HOSPITAL_COMMUNITY): Payer: Medicare Other

## 2016-05-01 DIAGNOSIS — Z794 Long term (current) use of insulin: Secondary | ICD-10-CM | POA: Insufficient documentation

## 2016-05-01 DIAGNOSIS — N189 Chronic kidney disease, unspecified: Secondary | ICD-10-CM | POA: Insufficient documentation

## 2016-05-01 DIAGNOSIS — E876 Hypokalemia: Secondary | ICD-10-CM

## 2016-05-01 DIAGNOSIS — E1122 Type 2 diabetes mellitus with diabetic chronic kidney disease: Secondary | ICD-10-CM | POA: Insufficient documentation

## 2016-05-01 DIAGNOSIS — I480 Paroxysmal atrial fibrillation: Secondary | ICD-10-CM | POA: Insufficient documentation

## 2016-05-01 DIAGNOSIS — I4891 Unspecified atrial fibrillation: Secondary | ICD-10-CM | POA: Diagnosis present

## 2016-05-01 DIAGNOSIS — I5032 Chronic diastolic (congestive) heart failure: Secondary | ICD-10-CM | POA: Diagnosis not present

## 2016-05-01 DIAGNOSIS — I130 Hypertensive heart and chronic kidney disease with heart failure and stage 1 through stage 4 chronic kidney disease, or unspecified chronic kidney disease: Secondary | ICD-10-CM | POA: Insufficient documentation

## 2016-05-01 DIAGNOSIS — R791 Abnormal coagulation profile: Secondary | ICD-10-CM | POA: Insufficient documentation

## 2016-05-01 DIAGNOSIS — Z7901 Long term (current) use of anticoagulants: Secondary | ICD-10-CM | POA: Diagnosis not present

## 2016-05-01 LAB — BASIC METABOLIC PANEL
ANION GAP: 9 (ref 5–15)
BUN: 17 mg/dL (ref 6–20)
CALCIUM: 9.1 mg/dL (ref 8.9–10.3)
CHLORIDE: 102 mmol/L (ref 101–111)
CO2: 26 mmol/L (ref 22–32)
CREATININE: 0.8 mg/dL (ref 0.44–1.00)
GLUCOSE: 223 mg/dL — AB (ref 65–99)
POTASSIUM: 3.3 mmol/L — AB (ref 3.5–5.1)
SODIUM: 137 mmol/L (ref 135–145)

## 2016-05-01 LAB — PROTIME-INR
INR: 1.73 — AB (ref 0.00–1.49)
Prothrombin Time: 20.2 seconds — ABNORMAL HIGH (ref 11.6–15.2)

## 2016-05-01 LAB — CBC
HEMATOCRIT: 44.3 % (ref 36.0–46.0)
Hemoglobin: 14 g/dL (ref 12.0–15.0)
MCH: 25.8 pg — ABNORMAL LOW (ref 26.0–34.0)
MCHC: 31.6 g/dL (ref 30.0–36.0)
MCV: 81.7 fL (ref 78.0–100.0)
PLATELETS: 282 10*3/uL (ref 150–400)
RBC: 5.42 MIL/uL — ABNORMAL HIGH (ref 3.87–5.11)
RDW: 16.5 % — AB (ref 11.5–15.5)
WBC: 13.6 10*3/uL — AB (ref 4.0–10.5)

## 2016-05-01 MED ORDER — POTASSIUM CHLORIDE CRYS ER 20 MEQ PO TBCR
40.0000 meq | EXTENDED_RELEASE_TABLET | Freq: Once | ORAL | Status: AC
  Administered 2016-05-01: 40 meq via ORAL
  Filled 2016-05-01: qty 2

## 2016-05-01 MED ORDER — METOPROLOL TARTRATE 5 MG/5ML IV SOLN
5.0000 mg | Freq: Once | INTRAVENOUS | Status: AC
  Administered 2016-05-01: 5 mg via INTRAVENOUS
  Filled 2016-05-01: qty 5

## 2016-05-01 NOTE — ED Notes (Signed)
Per pt said she came "for my heart, it wasn't working right it was skipping, I have A-fib". Pt denies pain.

## 2016-05-01 NOTE — ED Provider Notes (Signed)
CSN: [id number]     Arrival date & time 05/01/16  0610 History   First MD Initiated Contact with Patient 05/01/16 [phone]     Chief Complaint  Patient presents with  . Atrial Fibrillation     (Consider location/radiation/quality/duration/timing/severity/associated sxs/prior Treatment) Patient is a 75 y.o. female presenting with atrial fibrillation. The history is provided by the patient.  Atrial Fibrillation  She has a history of hypertension, diabetes, paroxysmal atrial fibrillation, diastolic heart failure. She noted irregular heartbeat at 4 AM. She denies chest pain, heaviness, tightness, pressure. She denies difficulty breathing. There's been no nausea, vomiting. She denies diaphoresis. She has been compliant with her medications and states that her last INR was therapeutic.  Past Medical History  Diagnosis Date  . Hypertension     a.  Renal arterial Dopplers 12/2011: 1-59% right renal artery stenosis  . Diabetes mellitus   . Hyperlipidemia   . Gout   . Paroxysmal Afib/Flutter     a. 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  . Diastolic CHF, chronic ([place])     a.  echo 2006 - ef 55-65%; mild diast dysfxn;    b. Echo 08/2011: Mild LVH, EF 60%;  c. 04/2013 Echo: EF 65-69%, mild conc LVH;  08/2014 Echo: EF 60-65%, mild-mod MR.  . Arthritis   . Morbid obesity ([place])   . Back pain   . Chronic kidney disease   . Obstructive sleep apnea on CPAP   . Chronic anticoagulation     due to aflutter   Past Surgical History  Procedure Laterality Date  . Cholecystectomy    . Total abdominal hysterectomy    . Appendectomy    . Tonsillectomy  1982  . Cardioversion  10/22/2011    Procedure: CARDIOVERSION;  Surgeon: [name], MD;  Location: [place];  Service: Cardiovascular;  Laterality: N/A;  . Cardioversion N/A 09/10/2011    Procedure: CARDIOVERSION;  Surgeon: [name], MD;  Location: [facility] CATH LAB;  Service: Cardiovascular;  Laterality: N/A;  . Atrial flutter ablation N/A 09/24/2011   Procedure: ATRIAL FLUTTER ABLATION;  Surgeon: [name], MD;  Location: [facility] CATH LAB;  Service: Cardiovascular;  Laterality: N/A;   Family History  Problem Relation Age of Onset  . Heart disease Father   . Hypertension Father   . Breast cancer Sister   . Cancer Sister     breast   Social History  Substance Use Topics  . Smoking status: Never Smoker   . Smokeless tobacco: Never Used  . Alcohol Use: No   OB History    No data available     Review of Systems  All other systems reviewed and are negative.     Allergies  Review of patient's allergies indicates no known allergies.  Home Medications   Prior to Admission medications   Medication Sig Start Date End Date Taking? Authorizing Provider  allopurinol (ZYLOPRIM) 300 MG tablet Take 300 mg by mouth daily. Take one tablet  daily 07/07/13   Historical Provider, MD  amiodarone (PACERONE) 200 MG tablet Take 200 mg once daily M-F 12/31/15   Sherren Mocha, MD  amLODipine (NORVASC) 5 MG tablet Take 1 tablet (5 mg total) by mouth daily. 12/31/15   [name], MD  cloNIDine (CATAPRES) 0.2 MG tablet Take 0.2 mg by mouth 2 (two) times daily.    Historical Provider, MD  eplerenone (INSPRA) 25 MG tablet Take 1 tablet (25 mg total) by mouth daily. 02/15/16   [name], MD  furosemide (LASIX) 40 MG tablet Take 40 mg by mouth daily. take 1 & 1/2 tablets (60 mg) by mouth twice daily 07/09/15   [name], MD  Insulin Detemir (LEVEMIR) 100 UNIT/ML Pen Inject 20 Units into the skin daily before breakfast.    Historical Provider, MD  Insulin Pen Needle 31G X 5 MM MISC by Does not apply route as directed.    Historical Provider, MD  levothyroxine (SYNTHROID, LEVOTHROID) 100 MCG tablet Take 1 tablet (100 mcg total) by mouth daily before breakfast. 03/03/16   [name], MD  linagliptin (TRADJENTA) 5 MG TABS tablet Take 1 tablet (5 mg total) by mouth daily. 10/05/13   [name], MD  lisinopril (PRINIVIL,ZESTRIL) 20 MG tablet  Take 1 tablet (20 mg total) by mouth daily. 03/19/16   [name], MD  RESTASIS 0.05 % ophthalmic emulsion Place 1 drop into both eyes 2 (two) times daily. 11/30/15   Historical Provider, MD  rosuvastatin (CRESTOR) 10 MG tablet Take 10 mg by mouth every evening.    Historical Provider, MD  warfarin (COUMADIN) 5 MG tablet TAKE AS DIRECTED BY COUMADIN CLINIC 03/10/16   [name], MD   BP 167/83 mmHg  Pulse 115  Temp(Src) 98.4 F (36.9 C) (Oral)  Resp 15  SpO2 98% Physical Exam  Nursing note and vitals reviewed.  75 year old female, resting comfortably and in no acute distress. Vital signs are significant for tachycardia and hypertension. Oxygen saturation is 98%, which is normal. Head is normocephalic and atraumatic. PERRLA, EOMI. Oropharynx is clear. Neck is nontender and supple without adenopathy or JVD. Back is nontender and there is no CVA tenderness. Lungs are clear without rales, wheezes, or rhonchi. Chest is nontender. Heart has an irregular rhythm without murmur. Abdomen is soft, flat, nontender without masses or hepatosplenomegaly and peristalsis is normoactive. Extremities have 1+ edema, full range of motion is present. Skin is warm and dry without rash. Neurologic: Mental status is normal, cranial nerves are intact, there are no motor or sensory deficits.  ED Course  Procedures (including critical care time) Labs Review Results for orders placed or performed during the hospital encounter of [id number]  Basic metabolic panel  Result Value Ref Range   Sodium 137 135 - 145 mmol/L   Potassium 3.3 (L) 3.5 - 5.1 mmol/L   Chloride 102 101 - 111 mmol/L   CO2 26 22 - 32 mmol/L   Glucose, Bld 223 (H) 65 - 99 mg/dL   BUN 17 6 - 20 mg/dL   Creatinine, Ser 0.80 0.44 - 1.00 mg/dL   Calcium 9.1 8.9 - 10.3 mg/dL   GFR calc non Af Amer >60 >60 mL/min   GFR calc Af Amer >60 >60 mL/min   Anion gap 9 5 - 15  CBC  Result Value Ref Range   WBC 13.6 (H) 4.0 - 10.5 K/uL   RBC 5.42  (H) 3.87 - 5.11 MIL/uL   Hemoglobin 14.0 12.0 - 15.0 g/dL   HCT 44.3 36.0 - 46.0 %   MCV 81.7 78.0 - 100.0 fL   MCH 25.8 (L) 26.0 - 34.0 pg   MCHC 31.6 30.0 - 36.0 g/dL   RDW 16.5 (H) 11.5 - 15.5 %   Platelets 282 150 - 400 K/uL  Protime-INR- (order if Patient is taking Coumadin / Warfarin)  Result Value Ref Range   Prothrombin Time 20.2 (H) 11.6 - 15.2 seconds   INR 1.73 (H) 0.00 - 1.49    Imaging  Review Dg Chest 2 View  05/01/2016  CLINICAL DATA:  Atrial fibrillation.  Hypertension.  Diabetes. EXAM: CHEST  2 VIEW COMPARISON:  12/11/2015 FINDINGS: The lungs are clear wiithout focal pneumonia, edema, pneumothorax or pleural effusion. Hyperexpansion is consistent with emphysema. Interstitial markings are diffusely coarsened with chronic features. The cardio pericardial silhouette is enlarged. Degenerative changes noted in the shoulders bilaterally. 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 Stable. Electronically Signed   By: Misty Stanley M.D.   On: 05/01/2016 07:33   I have personally reviewed and evaluated these images and lab results as part of my medical decision-making.   EKG Interpretation   Date/Time:  Thursday May 01 2016 06:22:34 EDT Ventricular Rate:  122 PR Interval:    QRS Duration: 78 QT Interval:  358 QTC Calculation: 489 R Axis:   29 Text Interpretation:  Atrial fibrillation with rapid ventricular response  Low voltage, precordial leads Borderline ST depression, diffuse leads  Borderline prolonged QT interval Baseline wander in lead(s) II aVR aVF  When compared with ECG of 12/11/2015, Atrial fibrillation with rapid  ventricular response has replaced Sinus rhythm Nonspecific ST abnormality  is now Present - probably rate-related Confirmed by [facility]  MD, Miciah Shealy  ([id number]) on 05/01/2016 6:34:47 AM      CRITICAL CARE Performed by: [id number] Total critical care time: 40 minutes Critical care time was exclusive of separately billable  procedures and treating other patients. Critical care was necessary to treat or prevent imminent or life-threatening deterioration. Critical care was time spent personally by me on the following activities: development of treatment plan with patient and/or surrogate as well as nursing, discussions with consultants, evaluation of patient's response to treatment, examination of patient, obtaining history from patient or surrogate, ordering and performing treatments and interventions, ordering and review of laboratory studies, ordering and review of radiographic studies, pulse oximetry and re-evaluation of patient's condition.  MDM   Final diagnoses:  Paroxysmal atrial fibrillation with rapid ventricular response (HCC)  Hypokalemia  Subtherapeutic international normalized ratio (INR)    Paroxysmal atrial fibrillation. Old records are reviewed confirming diagnosis of paroxysmal atrial fibrillation. She has a prior ED visit where rate control was obtained with metoprolol and she had spontaneous conversion to sinus rhythm. She is not in any distress here. She will be given IV metoprolol and observed. Screening labs are obtained.  Potassium is slightly low and she is given some oral potassium. INR is subtherapeutic-Will refer back to Coumadin clinic for recommendations and dose titration.   Patient returned from a chest x-ray and has converted to sinus rhythm. She is discharged with instructions to contact the Coumadin clinic for instructions on how to titrate her warfarin dose. Follow-up with her cardiologist.   Delora Fuel, MD [id number]

## 2016-05-01 NOTE — ED Notes (Signed)
Patient transported to X-ray 

## 2016-05-01 NOTE — Discharge Instructions (Signed)
Your INR was low today - 1.73. Call the Coumadin clinic later today for recommendations on how to adjust your warfarin dose.   Atrial Fibrillation Atrial fibrillation is a type of irregular or rapid heartbeat (arrhythmia). In atrial fibrillation, the heart quivers continuously in a chaotic pattern. This occurs when parts of the heart receive disorganized signals that make the heart unable to pump blood normally. This can increase the risk for stroke, heart failure, and other heart-related conditions. There are different types of atrial fibrillation, including:  Paroxysmal atrial fibrillation. This type starts suddenly, and it usually stops on its own shortly after it starts.  Persistent atrial fibrillation. This type often lasts longer than a week. It may stop on its own or with treatment.  Long-lasting persistent atrial fibrillation. This type lasts longer than 12 months.  Permanent atrial fibrillation. This type does not go away. Talk with your health care provider to learn about the type of atrial fibrillation that you have. CAUSES This condition is caused by some heart-related conditions or procedures, including:  A heart attack.  Coronary artery disease.  Heart failure.  Heart valve conditions.  High blood pressure.  Inflammation of the sac that surrounds the heart (pericarditis).  Heart surgery.  Certain heart rhythm disorders, such as Wolf-Parkinson-White syndrome. Other causes include:  Pneumonia.  Obstructive sleep apnea.  Blockage of an artery in the lungs (pulmonary embolism, or PE).  Lung cancer.  Chronic lung disease.  Thyroid problems, especially if the thyroid is overactive (hyperthyroidism).  Caffeine.  Excessive alcohol use or illegal drug use.  Use of some medicines, including certain decongestants and diet pills. Sometimes, the cause cannot be found. RISK FACTORS This condition is more likely to develop in:  People who are older in  age.  People who smoke.  People who have diabetes mellitus.  People who are overweight (obese).  Athletes who exercise vigorously. SYMPTOMS Symptoms of this condition include:  A feeling that your heart is beating rapidly or irregularly.  A feeling of discomfort or pain in your chest.  Shortness of breath.  Sudden light-headedness or weakness.  Getting tired easily during exercise. In some cases, there are no symptoms. DIAGNOSIS Your health care provider may be able to detect atrial fibrillation when taking your pulse. If detected, this condition may be diagnosed with:  An electrocardiogram (ECG).  A Holter monitor test that records your heartbeat patterns over a 24-hour period.  Transthoracic echocardiogram (TTE) to evaluate how blood flows through your heart.  Transesophageal echocardiogram (TEE) to view more detailed images of your heart.  A stress test.  Imaging tests, such as a CT scan or chest X-ray.  Blood tests. TREATMENT The main goals of treatment are to prevent blood clots from forming and to keep your heart beating at a normal rate and rhythm. The type of treatment that you receive depends on many factors, such as your underlying medical conditions and how you feel when you are experiencing atrial fibrillation. This condition may be treated with:  Medicine to slow down the heart rate, bring the heart's rhythm back to normal, or prevent clots from forming.  Electrical cardioversion. This is a procedure that resets your heart's rhythm by delivering a controlled, low-energy shock to the heart through your skin.  Different types of ablation, such as catheter ablation, catheter ablation with pacemaker, or surgical ablation. These procedures destroy the heart tissues that send abnormal signals. When the pacemaker is used, it is placed under your skin to help your heart  beat in a regular rhythm. HOME CARE INSTRUCTIONS  Take over-the counter and prescription  medicines only as told by your health care provider.  If your health care provider prescribed a blood-thinning medicine (anticoagulant), take it exactly as told. Taking too much blood-thinning medicine can cause bleeding. If you do not take enough blood-thinning medicine, you will not have the protection that you need against stroke and other problems.  Do not use tobacco products, including cigarettes, chewing tobacco, and e-cigarettes. If you need help quitting, ask your health care provider.  If you have obstructive sleep apnea, manage your condition as told by your health care provider.  Do not drink alcohol.  Do not drink beverages that contain caffeine, such as coffee, soda, and tea.  Maintain a healthy weight. Do not use diet pills unless your health care provider approves. Diet pills may make heart problems worse.  Follow diet instructions as told by your health care provider.  Exercise regularly as told by your health care provider.  Keep all follow-up visits as told by your health care provider. This is important. PREVENTION  Avoid drinking beverages that contain caffeine or alcohol.  Avoid certain medicines, especially medicines that are used for breathing problems.  Avoid certain herbs and herbal medicines, such as those that contain ephedra or ginseng.  Do not use illegal drugs, such as cocaine and amphetamines.  Do not smoke.  Manage your high blood pressure. SEEK MEDICAL CARE IF:  You notice a change in the rate, rhythm, or strength of your heartbeat.  You are taking an anticoagulant and you notice increased bruising.  You tire more easily when you exercise or exert yourself. SEEK IMMEDIATE MEDICAL CARE IF:  You have chest pain, abdominal pain, sweating, or weakness.  You feel nauseous.  You notice blood in your vomit, bowel movement, or urine.  You have shortness of breath.  You suddenly have swollen feet and ankles.  You feel dizzy.  You have  sudden weakness or numbness of the face, arm, or leg, especially on one side of the body.  You have trouble speaking, trouble understanding, or both (aphasia).  Your face or your eyelid droops on one side. These symptoms may represent a serious problem that is an emergency. Do not wait to see if the symptoms will go away. Get medical help right away. Call your local emergency services (911 in the [place] Do not drive yourself to the hospital.   This information is not intended to replace advice given to you by your health care provider. Make sure you discuss any questions you have with your health care provider.   Document Released: 10/06/2005 Document Revised: 06/27/2015 Document Reviewed: 01/31/2015 Elsevier Interactive Patient Education [employer].

## 2016-05-05 ENCOUNTER — Other Ambulatory Visit: Payer: Medicare Other

## 2016-05-06 ENCOUNTER — Telehealth: Payer: Self-pay | Admitting: Internal Medicine

## 2016-05-06 NOTE — Telephone Encounter (Signed)
Lets begin potassium 10 eq daily She will ned to decrease her thyroid supplement but will defer this to her new primary care visit on Thursday.

## 2016-05-06 NOTE — Telephone Encounter (Signed)
INR result of 1.7 from 05/01/16 sent to [place] in Coumadin clinic on 05/06/16.  Last POCT INR on 04/23/16 was 2.0.  Pt's present Coumadin dosage is 1 tablet daily except 1/2 tablet on Wednesdays and Fridays.  Advised pt to take 1.5 tablets today, then resume previous dosage as stated above. Keep scheduled follow-up appt in Coumadin Clinic on 05/21/16.  Pt verbalized understanding.

## 2016-05-06 NOTE — Telephone Encounter (Signed)
Patient c/o Palpitations:  High priority if patient c/o lightheadedness and shortness of breath.  1. How long have you been having palpitations? Off/on since 7/13  2. Are you currently experiencing lightheadedness and shortness of breath? no  3. Have you checked your BP and heart rate? (document readings)  P- 69 has been fine recently after rest- Was seen @ [facility]-   4. Are you experiencing any other symptoms? no

## 2016-05-06 NOTE — Telephone Encounter (Signed)
I called and spoke with the patient. She was in the ER 7/13 for a prolonged episode of a-fib. She was given IV metoprolol and states she felt better after this. She was not admitted. K+ 3.3 (given two potassium pills per the patient)/ INR- 1.7. She had an episode of a-fib on Saturday, but reports this was better than on Thursday when she went to the ER (only lasted 3 hours). Today she has had an episode of a-fib this morning- BP- 159/77 HR- 119, she recently rechecked her BP/ HR- 117/58 & 56. She is still taking amiodarone 200 mg daily except 100 mg two days a week. We decreased her synthroid dose to 100 mcg in May, she does not think she has had this rechecked. She is currently on no potassium supplements.  I advised her I will review with Dr. Caryl Comes and call her back.  Will forward INR reading to [facility].

## 2016-05-07 MED ORDER — POTASSIUM CHLORIDE ER 10 MEQ PO TBCR: 10.0000 meq | EXTENDED_RELEASE_TABLET | Freq: Every day | ORAL | Status: DC

## 2016-05-07 NOTE — Telephone Encounter (Signed)
I called and spoke with the patient. She is aware of Dr. Olin Pia recommendations to start potassium 10 meq once daily. She is also aware that her TSH/ K+ need to be rechecked by her PCP tomorrow. She will address this with her new PCP.

## 2016-05-13 ENCOUNTER — Encounter (HOSPITAL_COMMUNITY): Payer: Self-pay | Admitting: *Deleted

## 2016-05-22 ENCOUNTER — Encounter (HOSPITAL_COMMUNITY): Payer: Self-pay | Admitting: Nurse Practitioner

## 2016-05-22 ENCOUNTER — Ambulatory Visit (HOSPITAL_COMMUNITY)
Admission: RE | Admit: 2016-05-22 | Discharge: 2016-05-22 | Disposition: A | Discharge status: Home / Self Care | Payer: Medicare Other | Source: Ambulatory Visit | Attending: Nurse Practitioner | Admitting: Nurse Practitioner

## 2016-05-22 VITALS — BP 118/66 | HR 56 | Ht 61.0 in | Wt 252.4 lb

## 2016-05-22 DIAGNOSIS — Z8249 Family history of ischemic heart disease and other diseases of the circulatory system: Secondary | ICD-10-CM | POA: Insufficient documentation

## 2016-05-22 DIAGNOSIS — I5032 Chronic diastolic (congestive) heart failure: Secondary | ICD-10-CM | POA: Diagnosis not present

## 2016-05-22 DIAGNOSIS — G4733 Obstructive sleep apnea (adult) (pediatric): Secondary | ICD-10-CM | POA: Diagnosis not present

## 2016-05-22 DIAGNOSIS — I481 Persistent atrial fibrillation: Secondary | ICD-10-CM

## 2016-05-22 DIAGNOSIS — I130 Hypertensive heart and chronic kidney disease with heart failure and stage 1 through stage 4 chronic kidney disease, or unspecified chronic kidney disease: Secondary | ICD-10-CM | POA: Diagnosis not present

## 2016-05-22 DIAGNOSIS — Z9889 Other specified postprocedural states: Secondary | ICD-10-CM | POA: Insufficient documentation

## 2016-05-22 DIAGNOSIS — Z7901 Long term (current) use of anticoagulants: Secondary | ICD-10-CM | POA: Insufficient documentation

## 2016-05-22 DIAGNOSIS — Z9049 Acquired absence of other specified parts of digestive tract: Secondary | ICD-10-CM | POA: Diagnosis not present

## 2016-05-22 DIAGNOSIS — I4819 Other persistent atrial fibrillation: Secondary | ICD-10-CM

## 2016-05-22 DIAGNOSIS — N189 Chronic kidney disease, unspecified: Secondary | ICD-10-CM | POA: Diagnosis not present

## 2016-05-22 DIAGNOSIS — Z79899 Other long term (current) drug therapy: Secondary | ICD-10-CM | POA: Insufficient documentation

## 2016-05-22 DIAGNOSIS — I480 Paroxysmal atrial fibrillation: Secondary | ICD-10-CM | POA: Insufficient documentation

## 2016-05-22 DIAGNOSIS — E1122 Type 2 diabetes mellitus with diabetic chronic kidney disease: Secondary | ICD-10-CM | POA: Diagnosis not present

## 2016-05-22 DIAGNOSIS — M1990 Unspecified osteoarthritis, unspecified site: Secondary | ICD-10-CM | POA: Diagnosis not present

## 2016-05-22 DIAGNOSIS — M109 Gout, unspecified: Secondary | ICD-10-CM | POA: Insufficient documentation

## 2016-05-22 DIAGNOSIS — Z803 Family history of malignant neoplasm of breast: Secondary | ICD-10-CM | POA: Insufficient documentation

## 2016-05-22 DIAGNOSIS — I4891 Unspecified atrial fibrillation: Secondary | ICD-10-CM | POA: Diagnosis present

## 2016-05-22 DIAGNOSIS — E785 Hyperlipidemia, unspecified: Secondary | ICD-10-CM | POA: Diagnosis not present

## 2016-05-22 DIAGNOSIS — Z794 Long term (current) use of insulin: Secondary | ICD-10-CM | POA: Insufficient documentation

## 2016-05-22 LAB — T4, FREE: FREE T4: 1.24 ng/dL — AB (ref 0.61–1.12)

## 2016-05-22 LAB — TSH: TSH: 2.444 u[IU]/mL (ref 0.350–4.500)

## 2016-05-22 MED ORDER — DILTIAZEM HCL 30 MG PO TABS: ORAL_TABLET | ORAL | 3 refills | Status: DC

## 2016-05-22 NOTE — Patient Instructions (Signed)
Your physician has recommended you make the following change in your medication:  1)Take Amiodarone 200mg  on Saturday and Sunday this weekend 2)Cardizem 30mg  -- take 1 tablet every 4 hours AS NEEDED for heart rate >100 as long as blood pressure >100.

## 2016-05-22 NOTE — Progress Notes (Signed)
Patient ID: [name], female   DOB: [birth year], 75 y.o.   MRN: [id number]     Primary Care Physician: Dr. Luiz Iron, [facility] Referring Physician: Dr. Leim Fabry Stryjewski is a 75 y.o. female with a h/o PAF on amiodarone and had a recent ER visit for afib with v rate around 120 bpm starting at 4 am and lasting around 3 hours.. She is being seen in afib clinic for f/u ER visit. Has a h/o aflutter ablation in 2012 by Dr. Billy Fischer DCCV x 2, last one in 2013 with Dr. Graciela Husbands. She takes amiodarone 200 mg M-F. She did convert in the ER with K+ being repleted with K+ of 3.3. Since being home she has had a few shorter episodes. She gets scared when she has afib. Reports that she was very sick when she traveled to [place] for vacation two years ago and was in ICU for around two weeks with double pneumonia and heart failure. She is on thyroid replacement and in May TSH was elevated and thyroid replacement dose was reduced. She is on Cpap nightly.Does not drink alcohol or use a lot of caffeine, no smoking.  Today, she denies symptoms of palpitations, chest pain, shortness of breath, orthopnea, PND, lower extremity edema, dizziness, presyncope, syncope, or neurologic sequela. The patient is tolerating medications without difficulties and is otherwise without complaint today.   Past Medical History:  Diagnosis Date  . Arthritis   . Back pain   . Chronic anticoagulation    due to aflutter  . Chronic kidney disease   . Diabetes mellitus   . Diastolic CHF, chronic (HCC)    a.  echo 2006 - ef 55-65%; mild diast dysfxn;    b. Echo 08/2011: Mild LVH, EF 60%;  c. 04/2013 Echo: EF 65-69%, mild conc LVH;  08/2014 Echo: EF 60-65%, mild-mod MR.  . Gout   . Hyperlipidemia   . Hypertension    a.  Renal arterial Dopplers 12/2011: 1-59% right renal artery stenosis  . Morbid obesity (HCC)   . Obstructive sleep apnea on CPAP   . Paroxysmal Afib/Flutter    a. 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 APPENDECTOMY    . ATRIAL FLUTTER ABLATION N/A 09/24/2011   Procedure: ATRIAL FLUTTER ABLATION;  Surgeon: Marinus Maw, MD;  Location: [facility] CATH LAB;  Service: Cardiovascular;  Laterality: N/A;  . CARDIOVERSION  10/22/2011   Procedure: CARDIOVERSION;  Surgeon: Duke Salvia, MD;  Location: [facility] OR;  Service: Cardiovascular;  Laterality: N/A;  . CARDIOVERSION N/A 09/10/2011   Procedure: CARDIOVERSION;  Surgeon: Duke Salvia, MD;  Location: [facility] CATH LAB;  Service: Cardiovascular;  Laterality: N/A;  . CHOLECYSTECTOMY    . TONSILLECTOMY  1982  . TOTAL ABDOMINAL HYSTERECTOMY      Current Outpatient Prescriptions  Medication Sig Dispense Refill  . allopurinol (ZYLOPRIM) 300 MG tablet Take 300 mg by mouth daily.     Marland Kitchen amiodarone (PACERONE) 200 MG tablet Take 200 mg by mouth See admin instructions. Monday thru Friday 60 tablet 6  . amLODipine (NORVASC) 5 MG tablet Take 1 tablet (5 mg total) by mouth daily. 30 tablet 6  . cloNIDine (CATAPRES) 0.2 MG tablet Take 0.2 mg by mouth 2 (two) times daily.    Marland Kitchen eplerenone (INSPRA) 25 MG tablet Take 1 tablet (25 mg total) by mouth daily. 30 tablet 6  . furosemide (LASIX) 40 MG tablet Take 40 mg by  mouth daily.     . Insulin Detemir (LEVEMIR) 100 UNIT/ML Pen Inject 20 Units into the skin daily before breakfast.    . Insulin Pen Needle 31G X 5 MM MISC by Does not apply route as directed.    Marland Kitchen levothyroxine (SYNTHROID, LEVOTHROID) 100 MCG tablet Take 1 tablet (100 mcg total) by mouth daily before breakfast. 30 tablet 6  . lisinopril (PRINIVIL,ZESTRIL) 20 MG tablet Take 1 tablet (20 mg total) by mouth daily. 30 tablet 11  . potassium chloride (K-DUR) 10 MEQ tablet Take 1 tablet (10 mEq total) by mouth daily. 30 tablet 11  . rosuvastatin (CRESTOR) 10 MG tablet Take 10 mg by mouth every evening.    . warfarin (COUMADIN) 5 MG tablet TAKE AS DIRECTED BY COUMADIN CLINIC 30 tablet 1  . warfarin (COUMADIN) 5 MG  tablet Take 2.5-5 mg by mouth See admin instructions. Take 1/2 tablet on Wednesday and Friday then take 1 tablet all the other days    . diltiazem (CARDIZEM) 30 MG tablet Cardizem 30mg  -- take 1 tablet every 4 hours AS NEEDED for heart rate >100 as long as blood pressure >100. 45 tablet 3   No current facility-administered medications for this encounter.     No Known Allergies  Social History   Social History  . Marital status: Married    Spouse name: N/A  . Number of children: Y  . Years of education: N/A   Occupational History  . DISABILITY/housewife Retired   Social History Main Topics  . Smoking status: Never Smoker  . Smokeless tobacco: Never Used  . Alcohol use No  . Drug use: No  . Sexual activity: Yes   Other Topics Concern  . Not on file   Social History Narrative  . No narrative on file    Family History  Problem Relation Age of Onset  . Heart disease Father   . Hypertension Father   . Breast cancer Sister   . Cancer Sister     breast    ROS- All systems are reviewed and negative except as per the HPI above  Physical Exam: Vitals:   05/22/16 0859  BP: 118/66  Pulse: (!) 56  Weight: 252 lb 6.4 oz (114.5 kg)  Height: 5\' 1"  (1.549 m)    GEN- The patient is well appearing, alert and oriented x 3 today.   Head- normocephalic, atraumatic Eyes-  Sclera clear, conjunctiva pink Ears- hearing intact Oropharynx- clear Neck- supple, no JVP Lymph- no cervical lymphadenopathy Lungs- Clear to ausculation bilaterally, normal work of breathing Heart- Regular rate and rhythm, no murmurs, rubs or gallops, PMI not laterally displaced GI- soft, NT, ND, + BS Extremities- no clubbing, cyanosis, or edema MS- no significant deformity or atrophy Skin- no rash or lesion Psych- euthymic mood, full affect Neuro- strength and sensation are intact  EKG- Sinus brady at 56 bpm, pr int 192 ms, qrs int 82 ms, qtc 465 ms Epic records reviewed  Assessment and Plan: 1.  PAF Thyroid  panel today Increase amiodarone to 200 mg qd thru the next two weekends then return to usual M-F dose Echo Continue to use cpap nightly Educated re afib and when to report to the ER Will RX cardizem 30 mg , one every 4 hours as needed pill in pocket drug for breakthrough afib  F/u in two weeks  Lupita Leash C. [facility] [address] [place], [place] [address] [phone]

## 2016-05-23 LAB — T3, FREE: T3 FREE: 2 pg/mL (ref 2.0–4.4)

## 2016-05-26 ENCOUNTER — Ambulatory Visit (HOSPITAL_COMMUNITY)
Admission: RE | Admit: 2016-05-26 | Discharge: 2016-05-26 | Disposition: A | Discharge status: Home / Self Care | Payer: Medicare Other | Source: Ambulatory Visit | Attending: Nurse Practitioner | Admitting: Nurse Practitioner

## 2016-05-26 ENCOUNTER — Ambulatory Visit (INDEPENDENT_AMBULATORY_CARE_PROVIDER_SITE_OTHER): Payer: Medicare Other | Admitting: Pharmacist

## 2016-05-26 DIAGNOSIS — I340 Nonrheumatic mitral (valve) insufficiency: Secondary | ICD-10-CM | POA: Diagnosis not present

## 2016-05-26 DIAGNOSIS — I071 Rheumatic tricuspid insufficiency: Secondary | ICD-10-CM | POA: Diagnosis not present

## 2016-05-26 DIAGNOSIS — E119 Type 2 diabetes mellitus without complications: Secondary | ICD-10-CM | POA: Diagnosis not present

## 2016-05-26 DIAGNOSIS — I4819 Other persistent atrial fibrillation: Secondary | ICD-10-CM

## 2016-05-26 DIAGNOSIS — Z5181 Encounter for therapeutic drug level monitoring: Secondary | ICD-10-CM | POA: Diagnosis not present

## 2016-05-26 DIAGNOSIS — I4891 Unspecified atrial fibrillation: Secondary | ICD-10-CM

## 2016-05-26 DIAGNOSIS — I119 Hypertensive heart disease without heart failure: Secondary | ICD-10-CM | POA: Diagnosis not present

## 2016-05-26 DIAGNOSIS — I481 Persistent atrial fibrillation: Secondary | ICD-10-CM | POA: Insufficient documentation

## 2016-05-26 DIAGNOSIS — Z6841 Body Mass Index (BMI) 40.0 and over, adult: Secondary | ICD-10-CM | POA: Insufficient documentation

## 2016-05-26 LAB — POCT INR: INR: 2

## 2016-05-26 NOTE — Progress Notes (Signed)
Echocardiogram 2D Echocardiogram has been performed.  Tresa Res 05/26/2016, 3:47 PM

## 2016-06-02 ENCOUNTER — Telehealth: Payer: Self-pay | Admitting: Internal Medicine

## 2016-06-02 ENCOUNTER — Other Ambulatory Visit: Payer: Self-pay | Admitting: *Deleted

## 2016-06-02 MED ORDER — WARFARIN SODIUM 5 MG PO TABS: 5.0000 mg | ORAL_TABLET | ORAL | 3 refills | Status: DC

## 2016-06-02 NOTE — Telephone Encounter (Signed)
Called and left message for patient to call back.   Patient has follow-up appointment with Atrial Fib clinic this week. Patient was recently seen in office for Atrial Fib, for coumadin clinic, and for an echocardiogram. Patient is taking warfarin, diltiazem, and amiodarone to help with her A. Fib. Patient's weight in office on 05/22/16 was 252 lbs 6.4 oz. Will compare patient's current weight when she calls back.  Patient called back. Her current weight is 249.8 lbs. Patient's weight has decreased since last visit.  Patient denies any SOB or swelling at this time. Patient states she has some swelling in her ankles at the end of the day after being on her feet all day. Encouraged patient to keep a low salt diet. Patient reported taking all her medications as instructed. Patient stated she felt like she was in A. Fib over the weekend with a high heart rate. Patient stated she called her pastor and prayed, then her heart rate went back down. Encouraged patient to take her medications and keep praying since that works for her. Encouraged patient to call with any other questions or concerns. Reminded patient of her follow-up appointment with Roderic Palau NP on 06/05/16. Will forward to Roderic Palau NP and Dr. Caryl Comes, so they are aware.

## 2016-06-02 NOTE — Telephone Encounter (Signed)
Colletta Maryland RN  [facility]) is calling because Mrs. Boothby has been in/out of AFIB all weekend and has had a 14 pound weight gain over past 70mths . She has some edema . Please call Mrs. Cardonna at [phone]   Thanks

## 2016-06-05 ENCOUNTER — Ambulatory Visit (HOSPITAL_COMMUNITY)
Admission: RE | Admit: 2016-06-05 | Discharge: 2016-06-05 | Disposition: A | Discharge status: Home / Self Care | Payer: Medicare Other | Source: Ambulatory Visit | Attending: Nurse Practitioner | Admitting: Nurse Practitioner

## 2016-06-05 ENCOUNTER — Encounter (HOSPITAL_COMMUNITY): Payer: Self-pay | Admitting: Nurse Practitioner

## 2016-06-05 VITALS — BP 154/86 | HR 62 | Ht 61.0 in | Wt 252.2 lb

## 2016-06-05 DIAGNOSIS — Z79899 Other long term (current) drug therapy: Secondary | ICD-10-CM | POA: Insufficient documentation

## 2016-06-05 DIAGNOSIS — E785 Hyperlipidemia, unspecified: Secondary | ICD-10-CM | POA: Insufficient documentation

## 2016-06-05 DIAGNOSIS — I130 Hypertensive heart and chronic kidney disease with heart failure and stage 1 through stage 4 chronic kidney disease, or unspecified chronic kidney disease: Secondary | ICD-10-CM | POA: Insufficient documentation

## 2016-06-05 DIAGNOSIS — I5030 Unspecified diastolic (congestive) heart failure: Secondary | ICD-10-CM | POA: Diagnosis not present

## 2016-06-05 DIAGNOSIS — Z6841 Body Mass Index (BMI) 40.0 and over, adult: Secondary | ICD-10-CM | POA: Insufficient documentation

## 2016-06-05 DIAGNOSIS — N189 Chronic kidney disease, unspecified: Secondary | ICD-10-CM | POA: Diagnosis not present

## 2016-06-05 DIAGNOSIS — Z8249 Family history of ischemic heart disease and other diseases of the circulatory system: Secondary | ICD-10-CM | POA: Diagnosis not present

## 2016-06-05 DIAGNOSIS — Z794 Long term (current) use of insulin: Secondary | ICD-10-CM | POA: Diagnosis not present

## 2016-06-05 DIAGNOSIS — E1122 Type 2 diabetes mellitus with diabetic chronic kidney disease: Secondary | ICD-10-CM | POA: Diagnosis not present

## 2016-06-05 DIAGNOSIS — M109 Gout, unspecified: Secondary | ICD-10-CM | POA: Insufficient documentation

## 2016-06-05 DIAGNOSIS — I480 Paroxysmal atrial fibrillation: Secondary | ICD-10-CM | POA: Diagnosis present

## 2016-06-05 DIAGNOSIS — G4733 Obstructive sleep apnea (adult) (pediatric): Secondary | ICD-10-CM | POA: Diagnosis not present

## 2016-06-05 DIAGNOSIS — Z7901 Long term (current) use of anticoagulants: Secondary | ICD-10-CM | POA: Diagnosis not present

## 2016-06-05 NOTE — Patient Instructions (Signed)
Your physician has recommended you make the following change in your medication:  Resume your Amiodarone 1 tablet Mon-Fri  Cardizem 30mg  -- take 1 tablet every 4 hours AS NEEDED for heart rate >100 as long as blood pressure >100.

## 2016-06-05 NOTE — Progress Notes (Signed)
Patient ID: [name], female   DOB: Jan 26, 1941, 75 y.o.   MRN: [id number]     Primary Care Physician: Dr. Karle Starch, [place] Referring Physician: Dr. Lorita [name] [name] is a 75 y.o. female with a h/o PAF on amiodarone and had a recent ER visit for afib with v rate around 120 bpm starting at 4 am and lasting around 3 hours.. She is being seen in afib clinic for f/u ER visit. Has a h/o aflutter ablation in 2012 by Dr. Doylene Canard DCCV x 2, last one in 2013 with Dr. Caryl Comes. She takes amiodarone 200 mg M-F. She did convert in the ER with K+ being repleted with K+ of 3.3. Since being home she has had a few shorter episodes. She gets scared when she has afib. Reports that she was very sick when she traveled to [place] for vacation two years ago and was in ICU for around two weeks with double pneumonia and heart failure. She is on thyroid replacement and in May TSH was elevated and thyroid replacement dose was reduced. She is on Cpap nightly.Does not drink alcohol or use a lot of caffeine, no smoking.  F/u's with afib clinic today and reports one short episode of afib, did not last long enough to take 30 mg cardizem tab. Echo showed normal function with left atrium of 47 mm. Today, she feels well. Thyroid panel recently checked and OK.   Today, she denies symptoms of palpitations, chest pain, shortness of breath, orthopnea, PND, lower extremity edema, dizziness, presyncope, syncope, or neurologic sequela. The patient is tolerating medications without difficulties and is otherwise without complaint today.   Past Medical History:  Diagnosis Date  . Arthritis   . Back pain   . Chronic anticoagulation    due to aflutter  . Chronic kidney disease   . Diabetes mellitus   . Diastolic CHF, chronic ([place])    a.  echo 2006 - ef 55-65%; mild diast dysfxn;    b. Echo 08/2011: Mild LVH, EF 60%;  c. 04/2013 Echo: EF 65-69%, mild conc LVH;  08/2014 Echo: EF 60-65%, mild-mod MR.  . Gout   .  Hyperlipidemia   . Hypertension    a.  Renal arterial Dopplers 12/2011: 1-59% right renal artery stenosis  . Morbid obesity ([place])   . Obstructive sleep apnea on CPAP   . Paroxysmal Afib/Flutter    a. 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   Past Surgical History:  Procedure Laterality Date  . APPENDECTOMY    . ATRIAL FLUTTER ABLATION N/A 09/24/2011   Procedure: ATRIAL FLUTTER ABLATION;  Surgeon: [name], MD;  Location: [facility] CATH LAB;  Service: Cardiovascular;  Laterality: N/A;  . CARDIOVERSION  10/22/2011   Procedure: CARDIOVERSION;  Surgeon: [name], MD;  Location: [place];  Service: Cardiovascular;  Laterality: N/A;  . CARDIOVERSION N/A 09/10/2011   Procedure: CARDIOVERSION;  Surgeon: [name], MD;  Location[facility] CATH LAB;  Service: Cardiovascular;  Laterality: N/A;  . CHOLECYSTECTOMY    . TONSILLECTOMY  1982  . TOTAL ABDOMINAL HYSTERECTOMY      Current Outpatient Prescriptions  Medication Sig Dispense Refill  . allopurinol (ZYLOPRIM) 300 MG tablet Take 300 mg by mouth daily.     Marland Kitchen amiodarone (PACERONE) 200 MG tablet Take 200 mg by mouth daily. Take one tablet daily Mon-Fri 60 tablet 6  . amLODipine (NORVASC) 5 MG tablet Take 1 tablet (5 mg total) by mouth daily. 30 tablet 6  . cloNIDine (CATAPRES)  0.2 MG tablet Take 0.2 mg by mouth 2 (two) times daily.    Marland Kitchen diltiazem (CARDIZEM) 30 MG tablet Cardizem 30mg  -- take 1 tablet every 4 hours AS NEEDED for heart rate >100 as long as blood pressure >100. 45 tablet 3  . eplerenone (INSPRA) 25 MG tablet Take 1 tablet (25 mg total) by mouth daily. 30 tablet 6  . furosemide (LASIX) 40 MG tablet Take 40 mg by mouth daily.     . Insulin Detemir (LEVEMIR) 100 UNIT/ML Pen Inject 20 Units into the skin daily before breakfast.    . Insulin Pen Needle 31G X 5 MM MISC by Does not apply route as directed.    Marland Kitchen levothyroxine (SYNTHROID, LEVOTHROID) 100 MCG tablet Take 1 tablet (100 mcg total) by mouth daily before breakfast. 30 tablet 6  .  lisinopril (PRINIVIL,ZESTRIL) 20 MG tablet Take 1 tablet (20 mg total) by mouth daily. 30 tablet 11  . potassium chloride (K-DUR) 10 MEQ tablet Take 1 tablet (10 mEq total) by mouth daily. 30 tablet 11  . rosuvastatin (CRESTOR) 10 MG tablet Take 10 mg by mouth every evening.    . warfarin (COUMADIN) 5 MG tablet Take 1 tablet (5 mg total) by mouth as directed. 30 tablet 3   No current facility-administered medications for this encounter.     No Known Allergies  Social History   Social History  . Marital status: Married    Spouse name: N/A  . Number of children: Y  . Years of education: N/A   Occupational History  . DISABILITY/housewife Retired   Social History Main Topics  . Smoking status: Never Smoker  . Smokeless tobacco: Never Used  . Alcohol use No  . Drug use: No  . Sexual activity: Yes   Other Topics Concern  . Not on file   Social History Narrative  . No narrative on file    Family History  Problem Relation Age of Onset  . Heart disease Father   . Hypertension Father   . Breast cancer Sister   . Cancer Sister     breast    ROS- All systems are reviewed and negative except as per the HPI above  Physical Exam: Vitals:   06/05/16 0949  BP: (!) 154/86  Pulse: 62  Weight: 252 lb 3.2 oz (114.4 kg)  Height: 5\' 1"  (1.549 m)    GEN- The patient is well appearing, alert and oriented x 3 today.   Head- normocephalic, atraumatic Eyes-  Sclera clear, conjunctiva pink Ears- hearing intact Oropharynx- clear Neck- supple, no JVP Lymph- no cervical lymphadenopathy Lungs- Clear to ausculation bilaterally, normal work of breathing Heart- Regular rate and rhythm, no murmurs, rubs or gallops, PMI not laterally displaced GI- soft, NT, ND, + BS Extremities- no clubbing, cyanosis, or edema MS- no significant deformity or atrophy Skin- no rash or lesion Psych- euthymic mood, full affect Neuro- strength and sensation are intact  EKG- Sinus rhythm at 62 bpm, pr int  186 ms, qrs int 80 ms, qtc 444 ms Epic records reviewed  Assessment and Plan: 1. PAF Go back to  amiodarone to 200 mg qd  M-F dose Continue to use cpap nightly Educated re afib and when to report to the ER Has cardizem 30 mg , one every 4 hours as needed, pill in pocket drug for breakthrough afib Weight loss encouraged  F/u in three months  Butch Penny C. [facility] [address] [place], [place] [address]  [phone]     

## 2016-06-24 ENCOUNTER — Telehealth: Payer: Self-pay | Admitting: Internal Medicine

## 2016-06-24 NOTE — Telephone Encounter (Signed)
New Message:     She would like the ejection fraction of her last echo from August 8,2017 please.

## 2016-06-25 NOTE — Telephone Encounter (Signed)
I left a message on Stephanie's identified voice mail regarding the patient's most recent EF.

## 2016-07-07 ENCOUNTER — Ambulatory Visit (INDEPENDENT_AMBULATORY_CARE_PROVIDER_SITE_OTHER): Payer: Medicare Other | Admitting: Pharmacist

## 2016-07-07 DIAGNOSIS — Z5181 Encounter for therapeutic drug level monitoring: Secondary | ICD-10-CM | POA: Diagnosis not present

## 2016-07-07 DIAGNOSIS — I4891 Unspecified atrial fibrillation: Secondary | ICD-10-CM | POA: Diagnosis not present

## 2016-07-07 LAB — POCT INR: INR: 2

## 2016-07-14 ENCOUNTER — Other Ambulatory Visit: Payer: Self-pay | Admitting: Internal Medicine

## 2016-07-14 MED ORDER — AMLODIPINE BESYLATE 5 MG PO TABS: 5.0000 mg | ORAL_TABLET | Freq: Every day | ORAL | 10 refills | Status: DC

## 2016-07-21 ENCOUNTER — Other Ambulatory Visit: Payer: Self-pay | Admitting: Internal Medicine

## 2016-07-25 ENCOUNTER — Telehealth: Payer: Self-pay | Admitting: Internal Medicine

## 2016-07-25 NOTE — Telephone Encounter (Signed)
Fax documentation received from [facility], dated 07/21/16, stating that the patient was having lower extremity swelling that improves overnight. Weight has slowly trended up from 235 lbs (about 3 months ago) to 251 lbs today- she was 249.6 lbs on 07/21/16. I called and spoke with the patient.  She states that she has been having some fullness in her abdominal area and some lower extremity that she notices at night. She states she called to make an appt which is scheduled on 10/17 with Tommye Standard, PA. She is stable at this time. I confirmed with her that she is taking furosemide 40 mg once daily. I have advised her that she may take an extra 1/2 tablet (20 mg) x 3 days to see if this will help with abdominal/ lower extremity swelling. She voices understanding.

## 2016-08-04 ENCOUNTER — Encounter: Payer: Self-pay | Admitting: Physician Assistant

## 2016-08-05 ENCOUNTER — Ambulatory Visit (INDEPENDENT_AMBULATORY_CARE_PROVIDER_SITE_OTHER): Payer: Medicare Other | Admitting: Physician Assistant

## 2016-08-05 VITALS — BP 136/72 | HR 66 | Ht 61.0 in | Wt 256.0 lb

## 2016-08-05 DIAGNOSIS — I519 Heart disease, unspecified: Secondary | ICD-10-CM

## 2016-08-05 DIAGNOSIS — I10 Essential (primary) hypertension: Secondary | ICD-10-CM | POA: Diagnosis not present

## 2016-08-05 DIAGNOSIS — Z5181 Encounter for therapeutic drug level monitoring: Secondary | ICD-10-CM

## 2016-08-05 DIAGNOSIS — I5189 Other ill-defined heart diseases: Secondary | ICD-10-CM

## 2016-08-05 DIAGNOSIS — I480 Paroxysmal atrial fibrillation: Secondary | ICD-10-CM

## 2016-08-05 NOTE — Patient Instructions (Addendum)
Medication Instructions:   FOR 3 DAYS ONLY : TAKE LASIX 40 MG IN AM AND 20 MG IN PM  MAKE SURE YOU CONTINUE TO [place]  If you need a refill on your cardiac medications before your next appointment, please call your pharmacy.  Labwork: BMET AND LFT TODAY    Testing/Procedures: NONE ORDER TODAY    Follow-Up: 4  MONTHS WITH RENEE    Any Other Special Instructions Will Be Listed Below (If Applicable).

## 2016-08-05 NOTE — Progress Notes (Signed)
Cardiology Office Note Date:  08/05/2016  Patient ID:  [name], [name] 05-Nov-1940, MRN [id number] PCP:  No PCP Per Patient  Electrophysiologist: Dr. Caryl Comes   Chief Complaint: weight gain/bloating  History of Present Illness: [name] is a 75 y.o. female with history of PAFib, Aflutter ablation, DM, hypothyroid, HLD, HTN, OSA using CPAP, historically has had trouble with hypokalemia, comes to the office to be seen for Dr. Caryl Comes, last seen by him in April at that time her amiodarone decreased to 200mg  5 days/week.  Most recently though seen in the AF clinic in August after an ED visit with PAF in the 120's.  She had prior to that traveled to [place] and hospitalized in ICU for around two weeks with double pneumonia and heart failure. At that visit with donna, her amiodaorne was temp changed to 7days for 2 weeks then resume M-F only, and she was given PRN cardizem 30mg .  She is on thyroid replacement and in May TSH was elevated and thyroid replacement dose was reduced. She is on Cpap nightly.Does not drink alcohol or use a lot of caffeine, no smoking.  Today she reports feeling bloated and concerned about water retention.  She denies any SOB, no nocturnal symptoms, sleeps all night with her CPAP uninterrupted, no CP,no palpitation since her visit with the AF clinic.  No dizziness, near syncope or syncope.  She reports her weight in Jan 230's, steady until about March, since then steady weight gain with intermittently 3-4 pounds in a week.  She infrequently will have end of the day LE edema only.  She denies any bleeding or signs of bleeding on the warfarin.   AF history: AFlutter ablation 2012, Dr. Lovena Le AFib DCCV x2, last 2013 AAD tx  W/amiodarone currently Historically had been on Flecainide and Tikosyn   Past Medical History:  Diagnosis Date  . Arthritis   . Back pain   . Chronic anticoagulation    due to aflutter  . Chronic kidney disease   . Diabetes mellitus   .  Diastolic CHF, chronic ([place])    a.  echo 2006 - ef 55-65%; mild diast dysfxn;    b. Echo 08/2011: Mild LVH, EF 60%;  c. 04/2013 Echo: EF 65-69%, mild conc LVH;  08/2014 Echo: EF 60-65%, mild-mod MR.  . Gout   . Hyperlipidemia   . Hypertension    a.  Renal arterial Dopplers 12/2011: 1-59% right renal artery stenosis  . Morbid obesity ([place])   . Obstructive sleep apnea on CPAP   . Paroxysmal Afib/Flutter    a. 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 APPENDECTOMY    . ATRIAL FLUTTER ABLATION N/A 09/24/2011   Procedure: ATRIAL FLUTTER ABLATION;  Surgeon: [name], MD;  Location: [facility] CATH LAB;  Service: Cardiovascular;  Laterality: N/A;  . CARDIOVERSION  10/22/2011   Procedure: CARDIOVERSION;  Surgeon: [name], MD;  Location: [place];  Service: Cardiovascular;  Laterality: N/A;  . CARDIOVERSION N/A 09/10/2011   Procedure: CARDIOVERSION;  Surgeon: [name], MD;  Location: [facility] CATH LAB;  Service: Cardiovascular;  Laterality: N/A;  . CHOLECYSTECTOMY    . TONSILLECTOMY  1982  . TOTAL ABDOMINAL HYSTERECTOMY      Current Outpatient Prescriptions  Medication Sig Dispense Refill  . allopurinol (ZYLOPRIM) 300 MG tablet Take 300 mg by mouth daily.     Marland Kitchen amiodarone (PACERONE) 200 MG tablet Take 200 mg by mouth daily. Take  one tablet daily Mon-Fri 60 tablet 6  . amLODipine (NORVASC) 5 MG tablet Take 1 tablet (5 mg total) by mouth daily. 30 tablet 10  . cloNIDine (CATAPRES) 0.2 MG tablet Take 0.2 mg by mouth 2 (two) times daily.    Marland Kitchen diltiazem (CARDIZEM) 30 MG tablet Cardizem 30mg  -- take 1 tablet every 4 hours AS NEEDED for heart rate >100 as long as blood pressure >100. 45 tablet 3  . eplerenone (INSPRA) 25 MG tablet Take 1 tablet (25 mg total) by mouth daily. 30 tablet 6  . furosemide (LASIX) 40 MG tablet Take 40 mg by mouth daily.     . Insulin Detemir (LEVEMIR) 100 UNIT/ML Pen Inject 20 Units into the skin daily before breakfast.     . Insulin Pen Needle 31G X 5 MM MISC by Does not apply route as directed.    Marland Kitchen levothyroxine (SYNTHROID, LEVOTHROID) 100 MCG tablet Take 1 tablet (100 mcg total) by mouth daily before breakfast. 30 tablet 6  . lisinopril (PRINIVIL,ZESTRIL) 20 MG tablet Take 1 tablet (20 mg total) by mouth daily. 30 tablet 11  . potassium chloride (K-DUR) 10 MEQ tablet Take 1 tablet (10 mEq total) by mouth daily. 30 tablet 11  . rosuvastatin (CRESTOR) 10 MG tablet Take 10 mg by mouth every evening.    . warfarin (COUMADIN) 5 MG tablet TAKE 1 TABLET BY MOUTH AS DIRECTED 30 tablet 3   No current facility-administered medications for this visit.     Allergies:   Review of patient's allergies indicates no known allergies.   Social History:  The patient  reports that she has never smoked. She has never used smokeless tobacco. She reports that she does not drink alcohol or use drugs.   Family History:  The patient's family history includes Breast cancer in her sister; Cancer in her sister; Heart disease in her father; Hypertension in her father.  ROS:  Please see the history of present illness.  All other systems are reviewed and otherwise negative.   PHYSICAL EXAM:  VS:  BP 136/72   Pulse 66   Ht 5\' 1"  (1.549 m)   Wt 256 lb (116.1 kg)   BMI 48.37 kg/m  BMI: Body mass index is 48.37 kg/m. Well nourished, well developed, in no acute distress  HEENT: normocephalic, atraumatic  Neck: no JVD, carotid bruits or masses Cardiac:  RRR, no significant murmurs, no rubs, or gallops Lungs:  clear to auscultation bilaterally, no wheezing, rhonchi or rales  Abd: soft, nontender MS: no deformity or atrophy Ext: trace if any edema  Skin: warm and dry, no rash Neuro:  No gross deficits appreciated Psych: euthymic mood, full affect   EKG:  Done 06/05/16: SR, nonspecific T chanes  05/26/16: TTE Study Conclusions - Left ventricle: The cavity size was normal. Systolic function was   normal. The estimated ejection  fraction was in the range of 60%   to 65%. Wall motion was normal; there were no regional wall   motion abnormalities. Features are consistent with a pseudonormal   left ventricular filling pattern, with concomitant abnormal   relaxation and increased filling pressure (grade 2 diastolic   dysfunction). Doppler parameters are consistent with high   ventricular filling pressure. - Aortic valve: Transvalvular velocity was within the normal range.   There was no stenosis. There was no regurgitation. - Mitral valve: Transvalvular velocity was within the normal range.   There was no evidence for stenosis. There was mild regurgitation.   Valve area  by pressure half-time: 1.79 cm^2. Valve area by   continuity equation (using LVOT flow): 1.1 cm^2. - Left atrium: The atrium was severely dilated. 12mm - Right ventricle: The cavity size was normal. Wall thickness was   normal. Systolic function was normal. - Tricuspid valve: There was trivial regurgitation. - Pulmonary arteries: Systolic pressure was within the normal   range. PA peak pressure: 25 mm Hg (S).   Recent Labs: 12/11/2015: B Natriuretic Peptide 165.3 12/31/2015: ALT 14 05/01/2016: BUN 17; Creatinine, Ser 0.80; Hemoglobin 14.0; Platelets 282; Potassium 3.3; Sodium 137 05/22/2016: TSH 2.444  No results found for requested labs within last 8760 hours.   CrCl cannot be calculated (Patient's most recent lab result is older than the maximum 21 days allowed.).   Wt Readings from Last 3 Encounters:  08/05/16 256 lb (116.1 kg)  06/05/16 252 lb 3.2 oz (114.4 kg)  05/22/16 252 lb 6.4 oz (114.5 kg)     Other studies reviewed: Additional studies/records reviewed today include: summarized above  ASSESSMENT AND PLAN:  1. PAFib     CHA2DS2Vasc is at least 3, on warfarin, monitored and managed ith the coumadin clinic     On amiodarone, thyroid labs stable last check     Will check LFTs today, she reports annual visits with pulmonology and her  eye MD as well.  2. HTN     Stable  3. Weight gain, bloating     She is morbidly obese, with marked central obesity, weight gain seems more slow/steady, though has known diastolic dysfunction     Will get her BMET today with low K+ last lab     Add an extra 20mg  lasix PM for 3 days and monitor weight at home (will add K+ if needed)  Disposition: F/u in 40mo, sooner if needed  Current medicines are reviewed at length with the patient today.  The patient did not have any concerns regarding medicines.  Haywood Lasso, PA-C 08/05/2016 1:32 PM     [place] [address] [place] [address] [phone] (office)  [phone] (fax)

## 2016-08-06 LAB — HEPATIC FUNCTION PANEL
ALBUMIN: 3.7 g/dL (ref 3.6–5.1)
ALK PHOS: 131 U/L — AB (ref 33–130)
ALT: 19 U/L (ref 6–29)
AST: 22 U/L (ref 10–35)
BILIRUBIN INDIRECT: 0.4 mg/dL (ref 0.2–1.2)
Bilirubin, Direct: 0.1 mg/dL (ref <=0.2)
TOTAL PROTEIN: 6.9 g/dL (ref 6.1–8.1)
Total Bilirubin: 0.5 mg/dL (ref 0.2–1.2)

## 2016-08-06 LAB — BASIC METABOLIC PANEL
BUN: 17 mg/dL (ref 7–25)
CALCIUM: 9 mg/dL (ref 8.6–10.4)
CO2: 29 mmol/L (ref 20–31)
Chloride: 98 mmol/L (ref 98–110)
Creat: 0.85 mg/dL (ref 0.60–0.93)
GLUCOSE: 310 mg/dL — AB (ref 65–99)
POTASSIUM: 4.5 mmol/L (ref 3.5–5.3)
SODIUM: 140 mmol/L (ref 135–146)

## 2016-08-07 DIAGNOSIS — K760 Fatty (change of) liver, not elsewhere classified: Secondary | ICD-10-CM | POA: Insufficient documentation

## 2016-08-08 ENCOUNTER — Telehealth: Payer: Self-pay | Admitting: *Deleted

## 2016-08-08 NOTE — Telephone Encounter (Signed)
SPOKE TO PT ABOUT RESULTS AND VERBALIZED UNDERSTANDING.AND PT MENTIONED SHE UNDER A LOT OF STRESS WITH FAMILY ISSUES AND EMOTIONAL EATING AND NOT CONSISTENTLY TAKING CARE OF HER MEDICAL NEEDS.  PT WAS ENCOURAGED TO TAKE TIME TO MAKE SURE HER MEDICAL NEEDS ARE MET AND CALL us BACK IF SHE HAS ANY CARDIAC RELATED ISSUES

## 2016-08-08 NOTE — Telephone Encounter (Signed)
-----   Message from [place], [place] sent at 08/06/2016  7:54 AM EDT ----- Please let her know her kidney function is stable, continue with the extra 20mg  dose of lasix at night for 3 nights as we discussed.  Her BS is quite high, a non-fasting sample, though still quite high, let her know and to monitor her diet closely, follow up with her PMD regarding her DM management.  Her liver enzymes are normal.  Please forward her labs to her PMD.  Thanks Joseph Art

## 2016-08-21 ENCOUNTER — Ambulatory Visit (INDEPENDENT_AMBULATORY_CARE_PROVIDER_SITE_OTHER): Payer: Medicare Other | Admitting: Pharmacist

## 2016-08-21 DIAGNOSIS — Z5181 Encounter for therapeutic drug level monitoring: Secondary | ICD-10-CM

## 2016-08-21 DIAGNOSIS — I4891 Unspecified atrial fibrillation: Secondary | ICD-10-CM

## 2016-08-21 LAB — POCT INR: INR: 1.6

## 2016-09-04 ENCOUNTER — Ambulatory Visit (INDEPENDENT_AMBULATORY_CARE_PROVIDER_SITE_OTHER): Payer: Medicare Other | Admitting: *Deleted

## 2016-09-04 ENCOUNTER — Telehealth: Payer: Self-pay | Admitting: Pulmonary Disease

## 2016-09-04 DIAGNOSIS — Z5181 Encounter for therapeutic drug level monitoring: Secondary | ICD-10-CM

## 2016-09-04 DIAGNOSIS — I4891 Unspecified atrial fibrillation: Secondary | ICD-10-CM | POA: Diagnosis not present

## 2016-09-04 LAB — POCT INR: INR: 1.8

## 2016-09-04 NOTE — Telephone Encounter (Signed)
Spoke with the pt  She is calling to let Dr. Corrie Dandy know that she has a cold and has not been using her CPAP this wk  She states that she is having a lot of nasal congestion and cough that makes wearing mask uncomfortable  I advised she can try some saline spray for her nose, and to see her PCP if she is not improving  She is already scheduled with them next wk  Will forward to AD as an FYI, thanks!

## 2016-09-09 ENCOUNTER — Encounter (HOSPITAL_COMMUNITY): Payer: Self-pay | Admitting: Nurse Practitioner

## 2016-09-09 ENCOUNTER — Ambulatory Visit (HOSPITAL_COMMUNITY)
Admission: RE | Admit: 2016-09-09 | Discharge: 2016-09-09 | Disposition: A | Discharge status: Home / Self Care | Payer: Medicare Other | Source: Ambulatory Visit | Attending: Nurse Practitioner | Admitting: Nurse Practitioner

## 2016-09-09 VITALS — BP 136/68 | HR 69 | Ht 61.0 in | Wt 257.0 lb

## 2016-09-09 DIAGNOSIS — M1990 Unspecified osteoarthritis, unspecified site: Secondary | ICD-10-CM | POA: Insufficient documentation

## 2016-09-09 DIAGNOSIS — N189 Chronic kidney disease, unspecified: Secondary | ICD-10-CM | POA: Diagnosis not present

## 2016-09-09 DIAGNOSIS — I5032 Chronic diastolic (congestive) heart failure: Secondary | ICD-10-CM | POA: Diagnosis not present

## 2016-09-09 DIAGNOSIS — Z9049 Acquired absence of other specified parts of digestive tract: Secondary | ICD-10-CM | POA: Insufficient documentation

## 2016-09-09 DIAGNOSIS — G4733 Obstructive sleep apnea (adult) (pediatric): Secondary | ICD-10-CM | POA: Insufficient documentation

## 2016-09-09 DIAGNOSIS — I480 Paroxysmal atrial fibrillation: Secondary | ICD-10-CM | POA: Insufficient documentation

## 2016-09-09 DIAGNOSIS — Z803 Family history of malignant neoplasm of breast: Secondary | ICD-10-CM | POA: Diagnosis not present

## 2016-09-09 DIAGNOSIS — Z8249 Family history of ischemic heart disease and other diseases of the circulatory system: Secondary | ICD-10-CM | POA: Insufficient documentation

## 2016-09-09 DIAGNOSIS — Z794 Long term (current) use of insulin: Secondary | ICD-10-CM | POA: Insufficient documentation

## 2016-09-09 DIAGNOSIS — Z90710 Acquired absence of both cervix and uterus: Secondary | ICD-10-CM | POA: Diagnosis not present

## 2016-09-09 DIAGNOSIS — E1122 Type 2 diabetes mellitus with diabetic chronic kidney disease: Secondary | ICD-10-CM | POA: Diagnosis not present

## 2016-09-09 DIAGNOSIS — Z7901 Long term (current) use of anticoagulants: Secondary | ICD-10-CM | POA: Insufficient documentation

## 2016-09-09 DIAGNOSIS — E785 Hyperlipidemia, unspecified: Secondary | ICD-10-CM | POA: Diagnosis not present

## 2016-09-09 DIAGNOSIS — I130 Hypertensive heart and chronic kidney disease with heart failure and stage 1 through stage 4 chronic kidney disease, or unspecified chronic kidney disease: Secondary | ICD-10-CM | POA: Diagnosis not present

## 2016-09-09 DIAGNOSIS — M109 Gout, unspecified: Secondary | ICD-10-CM | POA: Diagnosis not present

## 2016-09-09 NOTE — Progress Notes (Signed)
Patient ID: [name], female   DOB: 09-02-1941, 75 y.o.   MRN: [id number]     Primary Care Physician: Dr. Karle Starch, [place] [facility] [place] Referring Physician: Dr. Lorita Officer [name] is a 75 y.o. female with a h/o PAF on amiodarone and had a recent ER visit, 7/13, for afib with v rate around 120 bpm starting at 4 am and lasting around 3 hours.She was seen in afib clinic, 8/3, for f/u ER visit. Has a h/o aflutter ablation in 2012 by Dr. Doylene Canard DCCV x 2, last one in 2013 with Dr. Caryl Comes. She takes amiodarone 200 mg M-F. She did convert in the ER with K+ being repleted with K+ of 3.3. Since being home she has had a few shorter episodes. She gets scared when she has afib. Reports that she was very sick when she traveled to [place] for vacation two years ago and was in ICU for around two weeks with double pneumonia and heart failure. She is on thyroid replacement and in May TSH was elevated and thyroid replacement dose was reduced. She is on Cpap nightly.Does not drink alcohol or use a lot of caffeine, no smoking.  F/u's with afib clinic, 8/17, and reports one short episode of afib, did not last long enough to take 30 mg cardizem tab. Echo showed normal function with left atrium of 47 mm. Today, she feels well. Thyroid panel recently checked and OK.   Returns to afib clinic 11/21 and reports no afib since ER visit in July.  Feels well. Had a cold with chest congestion last week but appears to be improved today. Sees PCP later today. TSH and liver both recently checked and OK.  Today, she denies symptoms of palpitations, chest pain, shortness of breath, orthopnea, PND, lower extremity edema, dizziness, presyncope, syncope, or neurologic sequela. The patient is tolerating medications without difficulties and is otherwise without complaint today.   Past Medical History:  Diagnosis Date  . Arthritis   . Back pain   . Chronic anticoagulation    due to aflutter  . Chronic kidney disease   .  Diabetes mellitus   . Diastolic CHF, chronic ([place])    a.  echo 2006 - ef 55-65%; mild diast dysfxn;    b. Echo 08/2011: Mild LVH, EF 60%;  c. 04/2013 Echo: EF 65-69%, mild conc LVH;  08/2014 Echo: EF 60-65%, mild-mod MR.  . Gout   . Hyperlipidemia   . Hypertension    a.  Renal arterial Dopplers 12/2011: 1-59% right renal artery stenosis  . Morbid obesity ([place])   . Obstructive sleep apnea on CPAP   . Paroxysmal Afib/Flutter    a. 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   Past Surgical History:  Procedure Laterality Date  . APPENDECTOMY    . ATRIAL FLUTTER ABLATION N/A 09/24/2011   Procedure: ATRIAL FLUTTER ABLATION;  Surgeon: [name], MD;  Location: [facility] CATH LAB;  Service: Cardiovascular;  Laterality: N/A;  . CARDIOVERSION  10/22/2011   Procedure: CARDIOVERSION;  Surgeon: [name], MD;  Location: [place];  Service: Cardiovascular;  Laterality: N/A;  . CARDIOVERSION N/A 09/10/2011   Procedure: CARDIOVERSION;  Surgeon: [name], MD;  Location: [facility] CATH LAB;  Service: Cardiovascular;  Laterality: N/A;  . CHOLECYSTECTOMY    . TONSILLECTOMY  1982  . TOTAL ABDOMINAL HYSTERECTOMY      Current Outpatient Prescriptions  Medication Sig Dispense Refill  . allopurinol (ZYLOPRIM) 300 MG tablet Take 300 mg by mouth daily.     Marland Kitchen  amiodarone (PACERONE) 200 MG tablet Take 200 mg by mouth daily. Take one tablet daily Mon-Fri 60 tablet 6  . amLODipine (NORVASC) 5 MG tablet Take 1 tablet (5 mg total) by mouth daily. 30 tablet 10  . cloNIDine (CATAPRES) 0.2 MG tablet Take 0.2 mg by mouth 2 (two) times daily.    Marland Kitchen diltiazem (CARDIZEM) 30 MG tablet Cardizem 30mg  -- take 1 tablet every 4 hours AS NEEDED for heart rate >100 as long as blood pressure >100. 45 tablet 3  . eplerenone (INSPRA) 25 MG tablet Take 1 tablet (25 mg total) by mouth daily. 30 tablet 6  . furosemide (LASIX) 40 MG tablet Take 40 mg by mouth daily.     . Insulin Detemir (LEVEMIR) 100 UNIT/ML Pen Inject 20 Units into the skin  daily before breakfast.    . Insulin Lispro Prot & Lispro (HUMALOG 75/25 MIX) (75-25) 100 UNIT/ML Kwikpen Inject insulin according to sliding scale maximum dose 56. Before breakfast and before dinner    . Insulin Pen Needle 31G X 5 MM MISC by Does not apply route as directed.    Marland Kitchen levothyroxine (SYNTHROID, LEVOTHROID) 100 MCG tablet Take 1 tablet (100 mcg total) by mouth daily before breakfast. 30 tablet 6  . lisinopril (PRINIVIL,ZESTRIL) 20 MG tablet Take 1 tablet (20 mg total) by mouth daily. 30 tablet 11  . potassium chloride (K-DUR) 10 MEQ tablet Take 1 tablet (10 mEq total) by mouth daily. 30 tablet 11  . rosuvastatin (CRESTOR) 10 MG tablet Take 10 mg by mouth every evening.    . sitaGLIPtin (JANUVIA) 100 MG tablet Take 100 mg by mouth.    . warfarin (COUMADIN) 5 MG tablet TAKE 1 TABLET BY MOUTH AS DIRECTED 30 tablet 3   No current facility-administered medications for this encounter.     No Known Allergies  Social History   Social History  . Marital status: Married    Spouse name: N/A  . Number of children: Y  . Years of education: N/A   Occupational History  . DISABILITY/housewife Retired   Social History Main Topics  . Smoking status: Never Smoker  . Smokeless tobacco: Never Used  . Alcohol use No  . Drug use: No  . Sexual activity: Yes   Other Topics Concern  . Not on file   Social History Narrative  . No narrative on file    Family History  Problem Relation Age of Onset  . Heart disease Father   . Hypertension Father   . Breast cancer Sister   . Cancer Sister     breast    ROS- All systems are reviewed and negative except as per the HPI above  Physical Exam: Vitals:   09/09/16 0850  BP: 136/68  Pulse: 69  Weight: 257 lb (116.6 kg)  Height: 5\' 1"  (1.549 m)    GEN- The patient is well appearing, alert and oriented x 3 today.   Head- normocephalic, atraumatic Eyes-  Sclera clear, conjunctiva pink Ears- hearing intact Oropharynx- clear Neck-  supple, no JVP Lymph- no cervical lymphadenopathy Lungs- Clear to ausculation bilaterally, normal work of breathing Heart- Regular rate and rhythm, no murmurs, rubs or gallops, PMI not laterally displaced GI- soft, NT, ND, + BS Extremities- no clubbing, cyanosis, or edema MS- no significant deformity or atrophy Skin- no rash or lesion Psych- euthymic mood, full affect Neuro- strength and sensation are intact  EKG- Sinus rhythm at 69 bpm, pr int 190 ms, qrs int 82 ms, qtc 465  ms Epic records reviewed  Assessment and Plan: 1. PAF Continue  Amiodarone  200 mg qd  M-F  Continue to use cpap nightly Has cardizem 30 mg , one every 4 hours as needed, pill in pocket drug for breakthrough afib Weight loss encouraged Continue warfarin for chadsvasc score of at least 6.  F/u in six months  Geroge Baseman. [place], [facility] [address] [place], [place] [address] [phone]

## 2016-09-18 ENCOUNTER — Ambulatory Visit (INDEPENDENT_AMBULATORY_CARE_PROVIDER_SITE_OTHER): Payer: Medicare Other | Admitting: *Deleted

## 2016-09-18 ENCOUNTER — Encounter (INDEPENDENT_AMBULATORY_CARE_PROVIDER_SITE_OTHER): Payer: Self-pay

## 2016-09-18 DIAGNOSIS — I4891 Unspecified atrial fibrillation: Secondary | ICD-10-CM | POA: Diagnosis not present

## 2016-09-18 DIAGNOSIS — Z5181 Encounter for therapeutic drug level monitoring: Secondary | ICD-10-CM

## 2016-09-18 LAB — POCT INR: INR: 2

## 2016-09-29 ENCOUNTER — Telehealth: Payer: Self-pay | Admitting: Physician Assistant

## 2016-09-29 NOTE — Telephone Encounter (Signed)
I spoke with [facility] and they were calling to see if a provider query was received in our office last week on 12/6 for Dr Caryl Comes.  I made her aware that I do not have knowledge whether or not this was received and Dr Olin Pia nurse would have to answer this question. They asked that Heather contact them to follow-up in regards to this query.

## 2016-09-29 NOTE — Telephone Encounter (Signed)
[name] from [facility] is calling to check the status of a Provider Hendricks Limes [id number]

## 2016-09-30 NOTE — Telephone Encounter (Signed)
Form in Medical Records in [place] will refax.

## 2016-10-09 ENCOUNTER — Encounter (INDEPENDENT_AMBULATORY_CARE_PROVIDER_SITE_OTHER): Payer: Self-pay

## 2016-10-09 ENCOUNTER — Ambulatory Visit (INDEPENDENT_AMBULATORY_CARE_PROVIDER_SITE_OTHER): Payer: Medicare Other | Admitting: Pharmacist

## 2016-10-09 DIAGNOSIS — I4891 Unspecified atrial fibrillation: Secondary | ICD-10-CM

## 2016-10-09 DIAGNOSIS — Z5181 Encounter for therapeutic drug level monitoring: Secondary | ICD-10-CM | POA: Diagnosis not present

## 2016-10-09 LAB — POCT INR: INR: 2.3

## 2016-10-10 ENCOUNTER — Other Ambulatory Visit: Payer: Self-pay | Admitting: Internal Medicine

## 2016-10-31 ENCOUNTER — Other Ambulatory Visit: Payer: Self-pay | Admitting: Internal Medicine

## 2016-10-31 DIAGNOSIS — Z1231 Encounter for screening mammogram for malignant neoplasm of breast: Secondary | ICD-10-CM

## 2016-11-11 ENCOUNTER — Ambulatory Visit (INDEPENDENT_AMBULATORY_CARE_PROVIDER_SITE_OTHER): Payer: Medicare Other | Admitting: *Deleted

## 2016-11-11 DIAGNOSIS — Z5181 Encounter for therapeutic drug level monitoring: Secondary | ICD-10-CM

## 2016-11-11 DIAGNOSIS — I4891 Unspecified atrial fibrillation: Secondary | ICD-10-CM

## 2016-11-11 LAB — POCT INR: INR: 2

## 2016-11-19 ENCOUNTER — Telehealth: Payer: Self-pay | Admitting: Pharmacist

## 2016-11-19 NOTE — Telephone Encounter (Signed)
Pt called clinic to report that she has started taking Vitamin D2 50,000 units once weekly. Med list updated. Also stated she forgot to take her Coumadin last night because she was upset watching the State of the [employer] speech. Advised her to take 1 tablet today instead of 1/2 tablet and keep f/u appt. No further questions.

## 2016-12-04 ENCOUNTER — Ambulatory Visit
Admission: RE | Admit: 2016-12-04 | Discharge: 2016-12-04 | Disposition: A | Discharge status: Home / Self Care | Payer: Medicare Other | Source: Ambulatory Visit | Attending: Internal Medicine | Admitting: Internal Medicine

## 2016-12-04 DIAGNOSIS — Z1231 Encounter for screening mammogram for malignant neoplasm of breast: Secondary | ICD-10-CM

## 2016-12-09 ENCOUNTER — Other Ambulatory Visit: Payer: Self-pay | Admitting: Internal Medicine

## 2016-12-23 ENCOUNTER — Ambulatory Visit (INDEPENDENT_AMBULATORY_CARE_PROVIDER_SITE_OTHER): Payer: Medicare Other | Admitting: *Deleted

## 2016-12-23 DIAGNOSIS — I4891 Unspecified atrial fibrillation: Secondary | ICD-10-CM

## 2016-12-23 DIAGNOSIS — Z5181 Encounter for therapeutic drug level monitoring: Secondary | ICD-10-CM

## 2016-12-23 LAB — POCT INR: INR: 1.9

## 2017-01-11 ENCOUNTER — Encounter (HOSPITAL_COMMUNITY): Payer: Self-pay | Admitting: Oncology

## 2017-01-11 ENCOUNTER — Emergency Department (HOSPITAL_COMMUNITY)
Admission: EM | Admit: 2017-01-11 | Discharge: 2017-01-11 | Disposition: A | Discharge status: Home / Self Care | Payer: Medicare Other | Attending: Emergency Medicine | Admitting: Emergency Medicine

## 2017-01-11 DIAGNOSIS — J029 Acute pharyngitis, unspecified: Secondary | ICD-10-CM | POA: Diagnosis present

## 2017-01-11 DIAGNOSIS — B955 Unspecified streptococcus as the cause of diseases classified elsewhere: Secondary | ICD-10-CM | POA: Diagnosis not present

## 2017-01-11 DIAGNOSIS — I130 Hypertensive heart and chronic kidney disease with heart failure and stage 1 through stage 4 chronic kidney disease, or unspecified chronic kidney disease: Secondary | ICD-10-CM | POA: Diagnosis not present

## 2017-01-11 DIAGNOSIS — Z794 Long term (current) use of insulin: Secondary | ICD-10-CM | POA: Insufficient documentation

## 2017-01-11 DIAGNOSIS — I5032 Chronic diastolic (congestive) heart failure: Secondary | ICD-10-CM | POA: Insufficient documentation

## 2017-01-11 DIAGNOSIS — J028 Acute pharyngitis due to other specified organisms: Secondary | ICD-10-CM | POA: Insufficient documentation

## 2017-01-11 DIAGNOSIS — Z7901 Long term (current) use of anticoagulants: Secondary | ICD-10-CM | POA: Diagnosis not present

## 2017-01-11 DIAGNOSIS — Z79899 Other long term (current) drug therapy: Secondary | ICD-10-CM | POA: Diagnosis not present

## 2017-01-11 DIAGNOSIS — N189 Chronic kidney disease, unspecified: Secondary | ICD-10-CM | POA: Insufficient documentation

## 2017-01-11 LAB — RAPID STREP SCREEN (MED CTR MEBANE ONLY): Streptococcus, Group A Screen (Direct): NEGATIVE

## 2017-01-11 MED ORDER — OXYCODONE-ACETAMINOPHEN 5-325 MG PO TABS
1.0000 | ORAL_TABLET | Freq: Once | ORAL | Status: AC
  Administered 2017-01-11: 1 via ORAL
  Filled 2017-01-11: qty 1

## 2017-01-11 MED ORDER — CLINDAMYCIN HCL 300 MG PO CAPS
300.0000 mg | ORAL_CAPSULE | Freq: Once | ORAL | Status: AC
  Administered 2017-01-11: 300 mg via ORAL
  Filled 2017-01-11: qty 1

## 2017-01-11 MED ORDER — CLINDAMYCIN HCL 300 MG PO CAPS: 300.0000 mg | ORAL_CAPSULE | Freq: Three times a day (TID) | ORAL | 0 refills | Status: DC

## 2017-01-11 MED ORDER — IBUPROFEN 200 MG PO TABS
400.0000 mg | ORAL_TABLET | Freq: Once | ORAL | Status: AC
  Administered 2017-01-11: 400 mg via ORAL
  Filled 2017-01-11: qty 2

## 2017-01-11 MED ORDER — HYDROCODONE-ACETAMINOPHEN 5-325 MG PO TABS: 1.0000 | ORAL_TABLET | ORAL | 0 refills | Status: DC | PRN

## 2017-01-11 NOTE — ED Notes (Signed)
Bed: [facility]PT1 Expected date:  Expected time:  Means of arrival:  Comments: 

## 2017-01-11 NOTE — ED Provider Notes (Signed)
[facility] DEPT Provider Note   CSN: [id number] Arrival date & time: 01/11/17  3267     History   Chief Complaint Chief Complaint  Patient presents with  . Sore Throat    HPI [name] is a 76 y.o. female.  HPI Patient reports right-sided sore throat and right-sided earache over the past 12 hours.  She states that she did sing a lot in church yesterday.  She reports painful swallowing.  No difficulty breathing.  No fevers or chills.  Her pain is moderate to severe in severity.  She does have a history of diabetes.  No chest pain or shortness of breath.  No other upper respiratory symptoms   Past Medical History:  Diagnosis Date  . Arthritis   . Back pain   . Chronic anticoagulation    due to aflutter  . Chronic kidney disease   . Diabetes mellitus   . Diastolic CHF, chronic ([employer])    a.  echo 2006 - ef 55-65%; mild diast dysfxn;    b. Echo 08/2011: Mild LVH, EF 60%;  c. 04/2013 Echo: EF 65-69%, mild conc LVH;  08/2014 Echo: EF 60-65%, mild-mod MR.  . Gout   . Hyperlipidemia   . Hypertension    a.  Renal arterial Dopplers 12/2011: 1-59% right renal artery stenosis  . Morbid obesity ([place])   . Obstructive sleep apnea on CPAP   . Paroxysmal Afib/Flutter    a. 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    Patient Active Problem List   Diagnosis Date Noted  . Obesity 12/19/2015  . Hypersomnia 12/19/2015  . Encounter for therapeutic drug monitoring 12/02/2013  . Dyspnea 10/03/2013  . Acute diastolic congestive heart failure, NYHA class 3 ([name]) 10/03/2013  . Hypokalemia 10/03/2013  . Prolonged QT interval 10/03/2013  . Acute diastolic heart failure ([name]) 10/03/2013  . Elevated transaminase level 08/11/2013  . CAP (community acquired pneumonia) 04/21/2013  . Postural dizziness 04/05/2013  . Chest pain 11/11/2012  . Long term (current) use of anticoagulants 06/09/2012  . Leukocytosis 04/09/2012  . Sinus bradycardia 10/01/2011  . Back pain   . Lumbar strain  08/19/2011  . BENIGN NEOPLASM OF ADRENAL GLAND 11/25/2010  . Chronic diastolic heart failure ([place]) 02/20/2010  . DM 05/16/2009  . GOUT 05/16/2009  . OBESITY, MORBID 05/16/2009  . Essential hypertension 05/16/2009  . ATRIAL FIBRILLATION, PAROXYSMAL 05/16/2009  . SYNCOPE, HX OF 05/16/2009  . ACHILLES TENDINITIS 01/22/2009  . HYPERLIPIDEMIA 11/30/2008  . Obstructive sleep apnea 11/30/2008    Past Surgical History:  Procedure Laterality Date  . APPENDECTOMY    . ATRIAL FLUTTER ABLATION N/A 09/24/2011   Procedure: ATRIAL FLUTTER ABLATION;  Surgeon: [name], MD;  Location: [facility] CATH LAB;  Service: Cardiovascular;  Laterality: N/A;  . CARDIOVERSION  10/22/2011   Procedure: CARDIOVERSION;  Surgeon: [name], MD;  Location: [facility];  Service: Cardiovascular;  Laterality: N/A;  . CARDIOVERSION N/A 09/10/2011   Procedure: CARDIOVERSION;  Surgeon: [name], MD;  Location: [facility] CATH LAB;  Service: Cardiovascular;  Laterality: N/A;  . CHOLECYSTECTOMY    . TONSILLECTOMY  1982  . TOTAL ABDOMINAL HYSTERECTOMY      OB History    No data available       Home Medications    Prior to Admission medications   Medication Sig Start Date End Date Taking? Authorizing Provider  allopurinol (ZYLOPRIM) 300 MG tablet Take 300 mg by mouth daily.  07/07/13   Historical Provider, MD  amiodarone (PACERONE) 200  MG tablet Take 200 mg by mouth daily. Take one tablet daily Mon-Fri 12/31/15   Sherren Mocha, MD  amLODipine (NORVASC) 5 MG tablet Take 1 tablet (5 mg total) by mouth daily. 07/14/16   [name], MD  clindamycin (CLEOCIN) 300 MG capsule Take 1 capsule (300 mg total) by mouth 3 (three) times daily. 01/11/17   Jola Schmidt, MD  cloNIDine (CATAPRES) 0.2 MG tablet Take 0.2 mg by mouth 2 (two) times daily.    Historical Provider, MD  diltiazem (CARDIZEM) 30 MG tablet Cardizem 30mg  -- take 1 tablet every 4 hours AS NEEDED for heart rate >100 as long as blood pressure >100. 05/22/16   Sherran Needs, NP  eplerenone (INSPRA) 25 MG tablet TAKE 1 TABLET BY MOUTH ONCE DAILY 10/10/16   [name], MD  furosemide (LASIX) 40 MG tablet Take 40 mg by mouth daily.  07/09/15   [name], MD  HYDROcodone-acetaminophen (NORCO/VICODIN) 5-325 MG tablet Take 1 tablet by mouth every 4 (four) hours as needed for moderate pain. 01/11/17   Jola Schmidt, MD  Insulin Detemir (LEVEMIR) 100 UNIT/ML Pen Inject 20 Units into the skin daily before breakfast.    Historical Provider, MD  Insulin Lispro Prot & Lispro (HUMALOG 75/25 MIX) (75-25) 100 UNIT/ML Kwikpen Inject insulin according to sliding scale maximum dose 56. Before breakfast and before dinner 08/12/16   Historical Provider, MD  Insulin Pen Needle 31G X 5 MM MISC by Does not apply route as directed.    Historical Provider, MD  levothyroxine (SYNTHROID, LEVOTHROID) 100 MCG tablet TAKE 1 TABLET BY MOUTH ONCE DAILY BEFORE BREAKFAST 10/10/16   [name], MD  lisinopril (PRINIVIL,ZESTRIL) 20 MG tablet Take 1 tablet (20 mg total) by mouth daily. 03/19/16   [name], MD  potassium chloride (K-DUR) 10 MEQ tablet Take 1 tablet (10 mEq total) by mouth daily. 05/07/16   [name], MD  rosuvastatin (CRESTOR) 10 MG tablet Take 10 mg by mouth every evening.    Historical Provider, MD  sitaGLIPtin (JANUVIA) 100 MG tablet Take 100 mg by mouth. 08/11/16 09/10/16  Historical Provider, MD  Vitamin D, Ergocalciferol, (DRISDOL) 50000 units CAPS capsule Take 50,000 Units by mouth every 7 (seven) days.    Historical Provider, MD  warfarin (COUMADIN) 5 MG tablet TAKE 1 TABLET BY MOUTH DAILY AS DIRECTED 12/10/16   [name], MD    Family History Family History  Problem Relation Age of Onset  . Heart disease Father   . Hypertension Father   . Breast cancer Sister   . Cancer Sister     breast    Social History Social History  Substance Use Topics  . Smoking status: Never Smoker  . Smokeless tobacco: Never Used  . Alcohol use No      Allergies   Patient has no known allergies.   Review of Systems Review of Systems  All other systems reviewed and are negative.    Physical Exam Updated Vital Signs BP (!) 164/63 (BP Location: Left Arm)   Pulse 63   Temp 98.2 F (36.8 C) (Oral)   Resp 20   SpO2 94%   Physical Exam  Constitutional: She is oriented to person, place, and time. She appears well-developed and well-nourished. No distress.  HENT:  Head: Normocephalic and atraumatic.  Uvula midline.  Tongue normal size.  Tolerating secretions.  Oral airway patent.  Mild fullness and swelling and erythema of the right peritonsillar region without obvious  fluctuant mass at this time.  Eyes: EOM are normal.  Neck: Normal range of motion.  Cardiovascular: Normal rate and regular rhythm.   Pulmonary/Chest: Effort normal and breath sounds normal.  Abdominal: Soft. She exhibits no distension. There is no tenderness.  Musculoskeletal: Normal range of motion.  Neurological: She is alert and oriented to person, place, and time.  Skin: Skin is warm and dry.  Psychiatric: She has a normal mood and affect. Judgment normal.  Nursing note and vitals reviewed.    ED Treatments / Results  Labs (all labs ordered are listed, but only abnormal results are displayed) Labs Reviewed  RAPID STREP SCREEN (NOT AT [facility])  CULTURE, GROUP A STREP [facility])    EKG  EKG Interpretation None       Radiology No results found.  Procedures Procedures (including critical care time)  Medications Ordered in ED Medications  ibuprofen (ADVIL,MOTRIN) tablet 400 mg (not administered)  oxyCODONE-acetaminophen (PERCOCET/ROXICET) 5-325 MG per tablet 1 tablet (not administered)  clindamycin (CLEOCIN) capsule 300 mg (not administered)     Initial Impression / Assessment and Plan / ED Course  I have reviewed the triage vital signs and the nursing notes.  Pertinent labs & imaging results that were available during my care of the  patient were reviewed by me and considered in my medical decision making (see chart for details).     Pharyngitis.  Patient be started on clindamycin.  Uvula still midline at this time but she does have some isolated findings the right peritonsillar region.  This could be early developing peritonsillar abscess.  Patient will be started on clindamycin and asked to return to the emergency department in 24 hours for recheck.  I encouraged her to return to the ER sooner for any new or worsening symptoms.  No airway compromise or issues at this time.  Anterior neck is normal.  Space under her tongue is soft.  Final Clinical Impressions(s) / ED Diagnoses   Final diagnoses:  Pharyngitis due to other organism    New Prescriptions New Prescriptions   CLINDAMYCIN (CLEOCIN) 300 MG CAPSULE    Take 1 capsule (300 mg total) by mouth 3 (three) times daily.   HYDROCODONE-ACETAMINOPHEN (NORCO/VICODIN) 5-325 MG TABLET    Take 1 tablet by mouth every 4 (four) hours as needed for moderate pain.     Jola Schmidt, MD 01/11/17 [phone]

## 2017-01-11 NOTE — ED Notes (Signed)
Bed: WA12 Expected date:  Expected time:  Means of arrival:  Comments: 

## 2017-01-11 NOTE — Discharge Instructions (Signed)
Return to the Emergency Department on Monday morning @8am  (Monday, March 26th)  Return to ER sooner for any new or worsening symptoms

## 2017-01-11 NOTE — ED Triage Notes (Signed)
Pt c/o sore throat and right sided ear ache.  Per pt she was singing for church yesterday prior to sx manifesting. Pt states that the sore throat started yesterday afternoon and has gotten progressively worse.  Pt states it is painful to swallow.  Redness noted.  Pt rates pain 10/10, sharp in nature.

## 2017-01-13 LAB — CULTURE, GROUP A STREP (THRC)

## 2017-01-20 ENCOUNTER — Ambulatory Visit (INDEPENDENT_AMBULATORY_CARE_PROVIDER_SITE_OTHER): Payer: Medicare Other | Admitting: *Deleted

## 2017-01-20 DIAGNOSIS — Z5181 Encounter for therapeutic drug level monitoring: Secondary | ICD-10-CM | POA: Diagnosis not present

## 2017-01-20 DIAGNOSIS — I4891 Unspecified atrial fibrillation: Secondary | ICD-10-CM

## 2017-01-20 LAB — POCT INR: INR: 3.2

## 2017-02-03 ENCOUNTER — Ambulatory Visit (INDEPENDENT_AMBULATORY_CARE_PROVIDER_SITE_OTHER): Payer: Medicare Other | Admitting: *Deleted

## 2017-02-03 DIAGNOSIS — I4891 Unspecified atrial fibrillation: Secondary | ICD-10-CM | POA: Diagnosis not present

## 2017-02-03 DIAGNOSIS — Z5181 Encounter for therapeutic drug level monitoring: Secondary | ICD-10-CM

## 2017-02-03 LAB — POCT INR: INR: 2.5

## 2017-02-04 ENCOUNTER — Other Ambulatory Visit: Payer: Self-pay | Admitting: Internal Medicine

## 2017-02-05 ENCOUNTER — Encounter: Payer: Self-pay | Admitting: Pulmonary Disease

## 2017-02-05 ENCOUNTER — Ambulatory Visit (INDEPENDENT_AMBULATORY_CARE_PROVIDER_SITE_OTHER): Payer: Medicare Other | Admitting: Pulmonary Disease

## 2017-02-05 DIAGNOSIS — G4733 Obstructive sleep apnea (adult) (pediatric): Secondary | ICD-10-CM | POA: Diagnosis not present

## 2017-02-05 NOTE — Progress Notes (Signed)
Subjective:    Patient ID: [name], female    DOB: July 14, 1941, 76 y.o.   MRN: [id number]  HPI  This is the case of [name], 76 y.o. Female, who is in the office today for follow up on: her OSA. She had a sleep study in 06/2011 with AHI of 20. She was seeing Dr. Gwenette Greet -- last f/u was 06/2013.   Patient has been using her CPAP machine. No issues with it. Although sometimes, machine does not deliver enough pressure. Her machine is 74 years old. Sometimes it is malfunctioning. For the most part, she feels better with CPAP use. Feels benefit of using it. Less snoring, hypersomnia.  ROV (02/12/2016)  Pt is here as f/u on her osa and cpap machine.  Since last seen, she states she has been doing well. Uses cpap -- no issues with it. Feels better. More energy. Feels benefit of cpap.   ROV 02/05/2017 Pt is back in office for follow up on her OSA. Since last seen, she states she has been doing well. Uses CPAP machine. Feels better using it. More energy. Less sleepiness. Recent snoring per her husband. She has a nasal mask. Occasional dryness of the mouth. She has a chinstrap.  Review of Systems  Constitutional: Negative.        Slowly losing weight x 3 yrs. Intentional.   HENT: Negative.   Eyes: Negative.   Respiratory: Negative.   Cardiovascular: Negative.   Gastrointestinal: Negative.   Endocrine: Negative.   Genitourinary: Negative.   Musculoskeletal: Negative.   Allergic/Immunologic: Negative.   Neurological: Negative.   Hematological: Negative.   Psychiatric/Behavioral: Negative.   All other systems reviewed and are negative.       Objective:   Physical Exam  Vitals:  Vitals:   02/05/17 0910  BP: 126/66  Pulse: 67  SpO2: 99%  Weight: 257 lb (116.6 kg)  Height: 5\' 1"  (1.549 m)    Constitutional/General:  Pleasant, well-nourished, well-developed, not in any distress,  Comfortably seating.  Well kempt  Body mass index is 48.56 kg/m. Wt Readings from Last 3  Encounters:  02/05/17 257 lb (116.6 kg)  09/09/16 257 lb (116.6 kg)  08/05/16 256 lb (116.1 kg)     Neck circumference: 17 inches HEENT: Pupils equal and reactive to light and accommodation. Anicteric sclerae. Normal nasal mucosa.   No oral  lesions,  mouth clear,  oropharynx clear, no postnasal drip. (-) Oral thrush. No dental caries.  Airway - Mallampati class III  Neck: No masses. Midline trachea. No JVD, (-) LAD. (-) bruits appreciated.  Respiratory/Chest: Grossly normal chest. (-) deformity. (-) Accessory muscle use.  Symmetric expansion. (-) Tenderness on palpation.  Resonant on percussion.  Diminished BS on both lower lung zones. (-) wheezing, crackles, rhonchi (-) egophony  Cardiovascular: Regular rate and  rhythm, heart sounds normal, no murmur or gallops, no peripheral edema  Gastrointestinal:  Normal bowel sounds. Soft, non-tender. No hepatosplenomegaly.  (-) masses.   Musculoskeletal:  Normal muscle tone. Normal gait.   Extremities: Grossly normal. (-) clubbing, cyanosis.  (-) edema  Skin: (-) rash,lesions seen.   Neurological/Psychiatric : alert, oriented to time, place, person. Normal mood and affect            Assessment & Plan:  Obstructive sleep apnea Patient has moderate sleep apnea. Compliant.   Pt received a new cpap machine in 12/2015. Feels benefit of cpap machine. Feels better. More energy. Less sleepiness. No issues with cpap.   The last  1-2 months, husband has noticed snoring. She has been using nasal mask. She also uses a chinstrap. She has dryness in her mouth occasionally. Denies sleepiness.  DL last month : 67%, AHI 0.5   Plan :  We extensively discussed the importance of treating OSA and the need to use PAP therapy.   Continue with autocpap 5-15 cm water. I mentioned we will keep an eye on her snoring. The husband is not complaining. If it becomes an issue, I advised her to call back. At that point, we will probably try to get a  full face mask. She has a nasal mask currently and uses a chinstrap. She has not replaced her supplies in 6 months. We will try to get her new supplies and I hope this will also help solve the problem of snoring.   Patient was instructed to have mask, tubings, filter, reservoir cleaned at least once a week with soapy water.  Patient was instructed to call the office if he/she is having issues with the PAP device.    I advised patient to obtain sufficient amount of sleep --  7 to 8 hours at least in a 24 hr period.  Patient was advised to follow good sleep hygiene.  Patient was advised NOT to engage in activities requiring concentration and/or vigilance if he/she is and  sleepy.  Patient is NOT to drive if he/she is sleepy.   F/U in 1 yr.  Monica Becton, MD 02/05/2017, 9:40 AM [facility] Pager [phone] After 3 pm or if no answer, call [phone]

## 2017-02-05 NOTE — Addendum Note (Signed)
Addended by: Benson Setting L on: 02/05/2017 10:02 AM   Modules accepted: Orders

## 2017-02-05 NOTE — Assessment & Plan Note (Signed)
Patient has moderate sleep apnea. Compliant.   Pt received a new cpap machine in 12/2015. Feels benefit of cpap machine. Feels better. More energy. Less sleepiness. No issues with cpap.   The last 1-2 months, husband has noticed snoring. She has been using nasal mask. She also uses a chinstrap. She has dryness in her mouth occasionally. Denies sleepiness.  DL last month : 67%, AHI 0.5   Plan :  We extensively discussed the importance of treating OSA and the need to use PAP therapy.   Continue with autocpap 5-15 cm water. I mentioned we will keep an eye on her snoring. The husband is not complaining. If it becomes an issue, I advised her to call back. At that point, we will probably try to get a full face mask. She has a nasal mask currently and uses a chinstrap. She has not replaced her supplies in 6 months. We will try to get her new supplies and I hope this will also help solve the problem of snoring.   Patient was instructed to have mask, tubings, filter, reservoir cleaned at least once a week with soapy water.  Patient was instructed to call the office if he/she is having issues with the PAP device.    I advised patient to obtain sufficient amount of sleep --  7 to 8 hours at least in a 24 hr period.  Patient was advised to follow good sleep hygiene.  Patient was advised NOT to engage in activities requiring concentration and/or vigilance if he/she is and  sleepy.  Patient is NOT to drive if he/she is sleepy.

## 2017-02-05 NOTE — Patient Instructions (Signed)
  It was a pleasure taking care of you today!  Continue using your CPAP machine. We will order you supplies.   Please make sure you use your CPAP device everytime you sleep.  We will monitor the usage of your machine per your insurance requirement.  Your insurance company may take the machine from you if you are not using it regularly.   Please clean the mask, tubings, filter, water reservoir with soapy water every week.  Please use distilled water for the water reservoir.   Please call the office or your machine provider (DME company) if you are having issues with the device.   Return to clinic in 1 year  with  NP/APP

## 2017-03-06 ENCOUNTER — Encounter (INDEPENDENT_AMBULATORY_CARE_PROVIDER_SITE_OTHER): Payer: Self-pay

## 2017-03-06 ENCOUNTER — Ambulatory Visit (INDEPENDENT_AMBULATORY_CARE_PROVIDER_SITE_OTHER): Payer: Medicare Other | Admitting: *Deleted

## 2017-03-06 DIAGNOSIS — Z5181 Encounter for therapeutic drug level monitoring: Secondary | ICD-10-CM

## 2017-03-06 DIAGNOSIS — I4891 Unspecified atrial fibrillation: Secondary | ICD-10-CM

## 2017-03-06 LAB — POCT INR: INR: 1.8

## 2017-03-09 ENCOUNTER — Other Ambulatory Visit: Payer: Self-pay | Admitting: Cardiology

## 2017-03-11 ENCOUNTER — Ambulatory Visit (HOSPITAL_COMMUNITY)
Admission: RE | Admit: 2017-03-11 | Discharge: 2017-03-11 | Disposition: A | Discharge status: Home / Self Care | Payer: Medicare Other | Source: Ambulatory Visit | Attending: Nurse Practitioner | Admitting: Nurse Practitioner

## 2017-03-11 ENCOUNTER — Encounter (HOSPITAL_COMMUNITY): Payer: Self-pay | Admitting: Nurse Practitioner

## 2017-03-11 VITALS — BP 126/58 | HR 69 | Ht 61.0 in | Wt 266.0 lb

## 2017-03-11 DIAGNOSIS — E1122 Type 2 diabetes mellitus with diabetic chronic kidney disease: Secondary | ICD-10-CM | POA: Diagnosis not present

## 2017-03-11 DIAGNOSIS — E785 Hyperlipidemia, unspecified: Secondary | ICD-10-CM | POA: Diagnosis not present

## 2017-03-11 DIAGNOSIS — M109 Gout, unspecified: Secondary | ICD-10-CM | POA: Insufficient documentation

## 2017-03-11 DIAGNOSIS — G4733 Obstructive sleep apnea (adult) (pediatric): Secondary | ICD-10-CM | POA: Diagnosis not present

## 2017-03-11 DIAGNOSIS — I130 Hypertensive heart and chronic kidney disease with heart failure and stage 1 through stage 4 chronic kidney disease, or unspecified chronic kidney disease: Secondary | ICD-10-CM | POA: Diagnosis not present

## 2017-03-11 DIAGNOSIS — Z7901 Long term (current) use of anticoagulants: Secondary | ICD-10-CM | POA: Diagnosis not present

## 2017-03-11 DIAGNOSIS — I480 Paroxysmal atrial fibrillation: Secondary | ICD-10-CM | POA: Diagnosis not present

## 2017-03-11 DIAGNOSIS — N189 Chronic kidney disease, unspecified: Secondary | ICD-10-CM | POA: Insufficient documentation

## 2017-03-11 DIAGNOSIS — Z794 Long term (current) use of insulin: Secondary | ICD-10-CM | POA: Insufficient documentation

## 2017-03-11 NOTE — Progress Notes (Signed)
Patient ID: [name], female   DOB: 1941-06-17, 76 y.o.   MRN: [id number]     Primary Care Physician: Dr. Luiz Iron, [facility] Referring Physician: Dr. Leim Fabry Skurka is a 76 y.o. female with a h/o PAF on amiodarone and had a recent ER visit, 7/13, for afib with v rate around 120 bpm starting at 4 am and lasting around 3 hours.She was seen in afib clinic, 8/3, for f/u ER visit. Has a h/o aflutter ablation in 2012 by Dr. Billy Fischer DCCV x 2, last one in 2013 with Dr. Graciela Husbands. She takes amiodarone 200 mg M-F. She did convert in the ER with K+ being repleted with K+ of 3.3. Since being home she has had a few shorter episodes. She gets scared when she has afib. Reports that she was very sick when she traveled to [place] for vacation two years ago and was in ICU for around two weeks with double pneumonia and heart failure. She is on thyroid replacement and in May TSH was elevated and thyroid replacement dose was reduced. She is on Cpap nightly.Does not drink alcohol or use a lot of caffeine, no smoking.  F/u's with afib clinic, 8/17, and reports one short episode of afib, did not last long enough to take 30 mg cardizem tab. Echo showed normal function with left atrium of 47 mm. Today, she feels well. Thyroid panel recently checked and OK.   Returns to afib clinic 11/21 and reports no afib since ER visit in July.  Feels well. Had a cold with chest congestion last week but appears to be improved today. Sees PCP later today. TSH and liver both recently checked and OK.  F/u in afib clinic 03/11/17- she feels well. No awareness of afib. States has had recent labs drawn with Dr. Lorine Bears and will request to review. States  thyroid dose replacement has been reduced. PFT's need  to be done as she has been on amiodarone for some years.  Today, she denies symptoms of palpitations, chest pain, shortness of breath, orthopnea, PND, lower extremity edema, dizziness, presyncope, syncope, or  neurologic sequela. The patient is tolerating medications without difficulties and is otherwise without complaint today.   Past Medical History:  Diagnosis Date  . Arthritis   . Back pain   . Chronic anticoagulation    due to aflutter  . Chronic kidney disease   . Diabetes mellitus   . Diastolic CHF, chronic (HCC)    a.  echo 2006 - ef 55-65%; mild diast dysfxn;    b. Echo 08/2011: Mild LVH, EF 60%;  c. 04/2013 Echo: EF 65-69%, mild conc LVH;  08/2014 Echo: EF 60-65%, mild-mod MR.  . Gout   . Hyperlipidemia   . Hypertension    a.  Renal arterial Dopplers 12/2011: 1-59% right renal artery stenosis  . Morbid obesity (HCC)   . Obstructive sleep apnea on CPAP   . Paroxysmal Afib/Flutter    a. 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   Past Surgical History:  Procedure Laterality Date  . APPENDECTOMY    . ATRIAL FLUTTER ABLATION N/A 09/24/2011   Procedure: ATRIAL FLUTTER ABLATION;  Surgeon: Marinus Maw, MD;  Location: [facility] CATH LAB;  Service: Cardiovascular;  Laterality: N/A;  . CARDIOVERSION  10/22/2011   Procedure: CARDIOVERSION;  Surgeon: Duke Salvia, MD;  Location: [facility] OR;  Service: Cardiovascular;  Laterality: N/A;  . CARDIOVERSION N/A 09/10/2011   Procedure: CARDIOVERSION;  Surgeon: Duke Salvia, MD;  Location: [facility] CATH LAB;  Service: Cardiovascular;  Laterality: N/A;  . CHOLECYSTECTOMY    . TONSILLECTOMY  1982  . TOTAL ABDOMINAL HYSTERECTOMY      Current Outpatient Prescriptions  Medication Sig Dispense Refill  . allopurinol (ZYLOPRIM) 300 MG tablet Take 300 mg by mouth daily.     Marland Kitchen amiodarone (PACERONE) 200 MG tablet Take 1 tablet (200mg ) by mouth daily Monday-Friday only. 90 tablet 1  . amLODipine (NORVASC) 5 MG tablet Take 1 tablet (5 mg total) by mouth daily. 30 tablet 10  . ciprofloxacin (CIPRO) 500 MG tablet 1 tablet 2 (two) times daily.  0  . cloNIDine (CATAPRES) 0.2 MG tablet Take 0.2 mg by mouth 2 (two) times daily.    Marland Kitchen diltiazem (CARDIZEM) 30 MG tablet Cardizem  30mg  -- take 1 tablet every 4 hours AS NEEDED for heart rate >100 as long as blood pressure >100. 45 tablet 3  . eplerenone (INSPRA) 25 MG tablet TAKE 1 TABLET BY MOUTH ONCE DAILY 30 tablet 5  . furosemide (LASIX) 40 MG tablet Take 40 mg by mouth daily.     Marland Kitchen HYDROcodone-acetaminophen (NORCO/VICODIN) 5-325 MG tablet Take 1 tablet by mouth every 4 (four) hours as needed for moderate pain. 10 tablet 0  . Insulin Detemir (LEVEMIR) 100 UNIT/ML Pen Inject 20 Units into the skin daily before breakfast.    . Insulin Lispro Prot & Lispro (HUMALOG 75/25 MIX) (75-25) 100 UNIT/ML Kwikpen Inject insulin according to sliding scale maximum dose 56. Before breakfast and before dinner    . Insulin Pen Needle 31G X 5 MM MISC by Does not apply route as directed.    Marland Kitchen levothyroxine (SYNTHROID, LEVOTHROID) 100 MCG tablet TAKE 1 TABLET BY MOUTH ONCE DAILY BEFORE BREAKFAST 30 tablet 3  . levothyroxine (SYNTHROID, LEVOTHROID) 75 MCG tablet Take by mouth.    Marland Kitchen lisinopril (PRINIVIL,ZESTRIL) 20 MG tablet Take 1 tablet (20 mg total) by mouth daily. 30 tablet 11  . potassium chloride (K-DUR) 10 MEQ tablet Take 1 tablet (10 mEq total) by mouth daily. (Patient taking differently: Take 10 mEq by mouth as needed. ) 30 tablet 11  . rosuvastatin (CRESTOR) 10 MG tablet Take 10 mg by mouth every evening.    . sitaGLIPtin (JANUVIA) 100 MG tablet Take 100 mg by mouth.    . Vitamin D, Ergocalciferol, (DRISDOL) 50000 units CAPS capsule Take 50,000 Units by mouth every 7 (seven) days.    Marland Kitchen warfarin (COUMADIN) 5 MG tablet TAKE 1 TABLET BY MOUTH DAILY AS DIRECTED 30 tablet 3   No current facility-administered medications for this encounter.     Allergies  Allergen Reactions  . Metformin Diarrhea    Social History   Social History  . Marital status: Married    Spouse name: N/A  . Number of children: Y  . Years of education: N/A   Occupational History  . DISABILITY/housewife Retired   Social History Main Topics  . Smoking  status: Never Smoker  . Smokeless tobacco: Never Used  . Alcohol use No  . Drug use: No  . Sexual activity: Yes   Other Topics Concern  . Not on file   Social History Narrative  . No narrative on file    Family History  Problem Relation Age of Onset  . Heart disease Father   . Hypertension Father   . Breast cancer Sister   . Cancer Sister        breast    ROS- All systems are reviewed and negative except as per the HPI  above  Physical Exam: Vitals:   03/11/17 0957  BP: (!) 126/58  Pulse: 69  Weight: 266 lb (120.7 kg)  Height: 5\' 1"  (1.549 m)    GEN- The patient is well appearing, alert and oriented x 3 today.   Head- normocephalic, atraumatic Eyes-  Sclera clear, conjunctiva pink Ears- hearing intact Oropharynx- clear Neck- supple, no JVP Lymph- no cervical lymphadenopathy Lungs- Clear to ausculation bilaterally, normal work of breathing Heart- Regular rate and rhythm, no murmurs, rubs or gallops, PMI not laterally displaced GI- soft, NT, ND, + BS Extremities- no clubbing, cyanosis, or edema MS- no significant deformity or atrophy Skin- no rash or lesion Psych- euthymic mood, full affect Neuro- strength and sensation are intact  EKG- Sinus rhythm at 69 bpm, pr int 184 ms, qrs int 82 ms, qtc 475 ms Epic records reviewed  Assessment and Plan: 1. PAF Very low afib burden Continue  Amiodarone  200 mg qd  M-F  Continue to use cpap nightly Has cardizem 30 mg , one every 4 hours as needed, pill in pocket drug for breakthrough afib Weight loss encouraged Regular exercise encourged Continue warfarin for chadsvasc score of at least 6. Will request last labs with PCP PFT's  F/u in six months  Lupita Leash [facility] [address] [place], [place] [address] [phone]

## 2017-03-11 NOTE — Patient Instructions (Signed)
Pulmonary Function Test --   Go to the main entrance of the hospital ([address]) use valet parking and ask to be taken to respiratory  No caffeine, smoking or inhalers for 4 hours prior to the test

## 2017-03-12 ENCOUNTER — Telehealth: Payer: Self-pay | Admitting: Pulmonary Disease

## 2017-03-12 NOTE — Telephone Encounter (Signed)
Spoke with [employer] with [facility], who is checking on status of cpap supplies renewal. Merrilee Seashore states Rx will be faxed over to be signed by RA.  Will await fax. Will send to [facility] to f/u on

## 2017-03-13 ENCOUNTER — Ambulatory Visit: Payer: Medicare Other | Admitting: Physician Assistant

## 2017-03-13 NOTE — Telephone Encounter (Signed)
[facility] was calling about a CMN which would go to [facility]. Rodena Piety states AD has already signed this. Nothing further is needed

## 2017-03-13 NOTE — Progress Notes (Deleted)
Cardiology Office Note Date:  03/13/2017  Patient ID:  [name], [name] [birth year], MRN [id number] PCP:  Kristopher Glee., MD  Electrophysiologist: Dr. Caryl Comes   Chief Complaint: prior planned f/u  History of Present Illness: [name] is a 76 y.o. female with history of PAFib, Aflutter ablation, DM, hypothyroid, HLD, HTN, OSA using CPAP, historically has had trouble with hypokalemia, comes to the office to be seen for Dr. Caryl Comes, last seen by him in April 2017 at that time her amiodarone decreased to 200mg  5 days/week.  Most recently though seen in the AF clinic only a couple days ago, doing well   ***She is on thyroid replacement and in May TSH was elevated and thyroid replacement dose was reduced. She is on Cpap nightly.Does not drink alcohol or use a lot of caffeine, no smoking.  ***She denies any SOB, no nocturnal symptoms, sleeps all night with her CPAP uninterrupted, no CP,no palpitation since her visit with the AF clinic.  No dizziness, near syncope or syncope.  She reports her weight in Jan 230's, steady until about March, since then steady weight gain with intermittently 3-4 pounds in a week.  She infrequently will have end of the day LE edema only.  *** She denies any bleeding or signs of bleeding on the warfarin.  *** symptoms *** bleeding/coumadin *** fluid status   AF history: AFlutter ablation 2012, Dr. Lovena Le AFib DCCV x2, last 2013 [facility] tx  W/amiodarone currently Historically had been on Flecainide and Tikosyn   Past Medical History:  Diagnosis Date  . Arthritis   . Back pain   . Chronic anticoagulation    due to aflutter  . Chronic kidney disease   . Diabetes mellitus   . Diastolic CHF, chronic ([facility])    a.  echo 2006 - ef 55-65%; mild diast dysfxn;    b. Echo 08/2011: Mild LVH, EF 60%;  c. 04/2013 Echo: EF 65-69%, mild conc LVH;  08/2014 Echo: EF 60-65%, mild-mod MR.  . Gout   . Hyperlipidemia   . Hypertension    a.  Renal arterial Dopplers 12/2011:  1-59% right renal artery stenosis  . Morbid obesity ([place])   . Obstructive sleep apnea on CPAP   . Paroxysmal Afib/Flutter    a. 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 APPENDECTOMY    . ATRIAL FLUTTER ABLATION N/A 09/24/2011   Procedure: ATRIAL FLUTTER ABLATION;  Surgeon: [name], MD;  Location: [facility] CATH LAB;  Service: Cardiovascular;  Laterality: N/A;  . CARDIOVERSION  10/22/2011   Procedure: CARDIOVERSION;  Surgeon: [name], MD;  Location: [place];  Service: Cardiovascular;  Laterality: N/A;  . CARDIOVERSION N/A 09/10/2011   Procedure: CARDIOVERSION;  Surgeon: [name], MD;  Location: [facility] CATH LAB;  Service: Cardiovascular;  Laterality: N/A;  . CHOLECYSTECTOMY    . TONSILLECTOMY  1982  . TOTAL ABDOMINAL HYSTERECTOMY      Current Outpatient Prescriptions  Medication Sig Dispense Refill  . allopurinol (ZYLOPRIM) 300 MG tablet Take 300 mg by mouth daily.     Marland Kitchen amiodarone (PACERONE) 200 MG tablet Take 1 tablet (200mg ) by mouth daily Monday-Friday only. 90 tablet 1  . amLODipine (NORVASC) 5 MG tablet Take 1 tablet (5 mg total) by mouth daily. 30 tablet 10  . ciprofloxacin (CIPRO) 500 MG tablet 1 tablet 2 (two) times daily.  0  . cloNIDine (CATAPRES) 0.2 MG tablet Take 0.2 mg by mouth 2 (  two) times daily.    Marland Kitchen diltiazem (CARDIZEM) 30 MG tablet Cardizem 30mg  -- take 1 tablet every 4 hours AS NEEDED for heart rate >100 as long as blood pressure >100. 45 tablet 3  . eplerenone (INSPRA) 25 MG tablet TAKE 1 TABLET BY MOUTH ONCE DAILY 30 tablet 5  . furosemide (LASIX) 40 MG tablet Take 40 mg by mouth daily.     Marland Kitchen HYDROcodone-acetaminophen (NORCO/VICODIN) 5-325 MG tablet Take 1 tablet by mouth every 4 (four) hours as needed for moderate pain. 10 tablet 0  . Insulin Detemir (LEVEMIR) 100 UNIT/ML Pen Inject 20 Units into the skin daily before breakfast.    . Insulin Lispro Prot & Lispro (HUMALOG 75/25 MIX) (75-25) 100 UNIT/ML  Kwikpen Inject insulin according to sliding scale maximum dose 56. Before breakfast and before dinner    . Insulin Pen Needle 31G X 5 MM MISC by Does not apply route as directed.    Marland Kitchen levothyroxine (SYNTHROID, LEVOTHROID) 100 MCG tablet TAKE 1 TABLET BY MOUTH ONCE DAILY BEFORE BREAKFAST 30 tablet 3  . levothyroxine (SYNTHROID, LEVOTHROID) 75 MCG tablet Take by mouth.    Marland Kitchen lisinopril (PRINIVIL,ZESTRIL) 20 MG tablet Take 1 tablet (20 mg total) by mouth daily. 30 tablet 11  . potassium chloride (K-DUR) 10 MEQ tablet Take 1 tablet (10 mEq total) by mouth daily. (Patient taking differently: Take 10 mEq by mouth as needed. ) 30 tablet 11  . rosuvastatin (CRESTOR) 10 MG tablet Take 10 mg by mouth every evening.    . sitaGLIPtin (JANUVIA) 100 MG tablet Take 100 mg by mouth.    . Vitamin D, Ergocalciferol, (DRISDOL) 50000 units CAPS capsule Take 50,000 Units by mouth every 7 (seven) days.    Marland Kitchen warfarin (COUMADIN) 5 MG tablet TAKE 1 TABLET BY MOUTH DAILY AS DIRECTED 30 tablet 3   No current facility-administered medications for this visit.     Allergies:   Metformin   Social History:  The patient  reports that she has never smoked. She has never used smokeless tobacco. She reports that she does not drink alcohol or use drugs.   Family History:  The patient's family history includes Breast cancer in her sister; Cancer in her sister; Heart disease in her father; Hypertension in her father.  ROS:  Please see the history of present illness.  All other systems are reviewed and otherwise negative.   PHYSICAL EXAM:  VS:  There were no vitals taken for this visit. BMI: There is no height or weight on file to calculate BMI. Well nourished, well developed, in no acute distress  HEENT: normocephalic, atraumatic  Neck: no JVD, carotid bruits or masses Cardiac:  *** RRR, no significant murmurs, no rubs, or gallops Lungs:  *** clear to auscultation bilaterally, no wheezing, rhonchi or rales  Abd: soft,  nontender MS: no deformity or atrophy Ext: *** trace if any edema  Skin: warm and dry, no rash Neuro:  No gross deficits appreciated Psych: euthymic mood, full affect   EKG:  Done *** 06/05/16: SR, nonspecific T chanes  05/26/16: TTE Study Conclusions - Left ventricle: The cavity size was normal. Systolic function was   normal. The estimated ejection fraction was in the range of 60%   to 65%. Wall motion was normal; there were no regional wall   motion abnormalities. Features are consistent with a pseudonormal   left ventricular filling pattern, with concomitant abnormal   relaxation and increased filling pressure (grade 2 diastolic   dysfunction). Doppler parameters  are consistent with high   ventricular filling pressure. - Aortic valve: Transvalvular velocity was within the normal range.   There was no stenosis. There was no regurgitation. - Mitral valve: Transvalvular velocity was within the normal range.   There was no evidence for stenosis. There was mild regurgitation.   Valve area by pressure half-time: 1.79 cm^2. Valve area by   continuity equation (using LVOT flow): 1.1 cm^2. - Left atrium: The atrium was severely dilated. 83mm - Right ventricle: The cavity size was normal. Wall thickness was   normal. Systolic function was normal. - Tricuspid valve: There was trivial regurgitation. - Pulmonary arteries: Systolic pressure was within the normal   range. PA peak pressure: 25 mm Hg (S).   Recent Labs: 05/01/2016: Hemoglobin 14.0; Platelets 282 05/22/2016: TSH 2.444 08/05/2016: ALT 19; BUN 17; Creat 0.85; Potassium 4.5; Sodium 140  No results found for requested labs within last 8760 hours.   CrCl cannot be calculated (Patient's most recent lab result is older than the maximum 21 days allowed.).   Wt Readings from Last 3 Encounters:  03/11/17 266 lb (120.7 kg)  02/05/17 257 lb (116.6 kg)  09/09/16 257 lb (116.6 kg)     Other studies reviewed: Additional studies/records  reviewed today include: summarized above  ASSESSMENT AND PLAN:  1. PAFib     CHA2DS2Vasc is at least 3, on warfarin, monitored and managed ith the coumadin clinic     ***On amiodarone, thyroid labs stable last check     she reports annual visits with pulmonology and her eye MD as well.  2. HTN     ***Stable  3. Weight gain, bloating     She is morbidly obese, with marked central obesity, weight gain seems more slow/steady, though has known diastolic dysfunction     ***  Disposition: ***  Current medicines are reviewed at length with the patient today.  The patient did not have any concerns regarding medicines.  Haywood Lasso, PA-C 03/13/2017 8:01 AM     [facility] [address] [place] [address] [phone] (office)  [phone] (fax)

## 2017-03-13 NOTE — Telephone Encounter (Signed)
I have not received anything from [facility] for this pt

## 2017-03-18 ENCOUNTER — Encounter (HOSPITAL_COMMUNITY): Payer: Medicare Other

## 2017-03-20 ENCOUNTER — Telehealth: Payer: Self-pay | Admitting: Pulmonary Disease

## 2017-03-20 DIAGNOSIS — G4733 Obstructive sleep apnea (adult) (pediatric): Secondary | ICD-10-CM

## 2017-03-20 NOTE — Telephone Encounter (Signed)
Error.Stanley A Dalton ° °

## 2017-03-20 NOTE — Telephone Encounter (Signed)
Spoke with the pt  She states [facility] needing order for a new CPAP mask  They gave her a FFM and she does not like it  Order was sent for her to get the mask of her choice  Nothing further needed

## 2017-03-25 ENCOUNTER — Ambulatory Visit (HOSPITAL_COMMUNITY)
Admission: RE | Admit: 2017-03-25 | Discharge: 2017-03-25 | Disposition: A | Discharge status: Home / Self Care | Payer: Medicare Other | Source: Ambulatory Visit | Attending: Nurse Practitioner | Admitting: Nurse Practitioner

## 2017-03-25 DIAGNOSIS — I480 Paroxysmal atrial fibrillation: Secondary | ICD-10-CM

## 2017-03-25 DIAGNOSIS — J984 Other disorders of lung: Secondary | ICD-10-CM | POA: Diagnosis not present

## 2017-03-25 DIAGNOSIS — J449 Chronic obstructive pulmonary disease, unspecified: Secondary | ICD-10-CM | POA: Diagnosis not present

## 2017-03-25 LAB — PULMONARY FUNCTION TEST
DL/VA % PRED: 106 %
DL/VA: 4.68 ml/min/mmHg/L
DLCO UNC: 14.06 ml/min/mmHg
DLCO unc % pred: 69 %
FEF 25-75 POST: 0.61 L/s
FEF 25-75 PRE: 0.72 L/s
FEF2575-%Change-Post: -14 %
FEF2575-%PRED-PRE: 45 %
FEF2575-%Pred-Post: 38 %
FEV1-%Change-Post: 0 %
FEV1-%PRED-PRE: 55 %
FEV1-%Pred-Post: 55 %
FEV1-Post: 1.04 L
FEV1-Pre: 1.05 L
FEV1FVC-%Change-Post: 2 %
FEV1FVC-%Pred-Pre: 81 %
FEV6-%CHANGE-POST: -1 %
FEV6-%PRED-POST: 70 %
FEV6-%PRED-PRE: 71 %
FEV6-Post: 1.67 L
FEV6-Pre: 1.71 L
FEV6FVC-%PRED-POST: 105 %
FEV6FVC-%Pred-Pre: 105 %
FVC-%Change-Post: -2 %
FVC-%Pred-Post: 66 %
FVC-%Pred-Pre: 68 %
FVC-POST: 1.67 L
FVC-Pre: 1.72 L
POST FEV6/FVC RATIO: 100 %
PRE FEV1/FVC RATIO: 61 %
Post FEV1/FVC ratio: 62 %
Pre FEV6/FVC Ratio: 100 %
RV % pred: 138 %
RV: 2.91 L
TLC % PRED: 112 %
TLC: 5.17 L

## 2017-03-25 MED ORDER — ALBUTEROL SULFATE (2.5 MG/3ML) 0.083% IN NEBU
2.5000 mg | INHALATION_SOLUTION | Freq: Once | RESPIRATORY_TRACT | Status: AC
  Administered 2017-03-25: 2.5 mg via RESPIRATORY_TRACT

## 2017-04-03 ENCOUNTER — Ambulatory Visit (INDEPENDENT_AMBULATORY_CARE_PROVIDER_SITE_OTHER): Payer: Medicare Other | Admitting: *Deleted

## 2017-04-03 DIAGNOSIS — Z5181 Encounter for therapeutic drug level monitoring: Secondary | ICD-10-CM

## 2017-04-03 DIAGNOSIS — I4891 Unspecified atrial fibrillation: Secondary | ICD-10-CM | POA: Diagnosis not present

## 2017-04-03 LAB — POCT INR: INR: 2.9

## 2017-04-06 ENCOUNTER — Other Ambulatory Visit: Payer: Self-pay | Admitting: Internal Medicine

## 2017-04-17 ENCOUNTER — Telehealth: Payer: Self-pay

## 2017-04-17 NOTE — Telephone Encounter (Signed)
Pt called into clinic, states she has been started on Cephalexin 500mg  tablets.  Advised pt this antibiotic is safe to take with Coumadin and does not typically interact.  No need to adjust Coumadin dosage or make sooner follow-up. Advised pt to keep scheduled follow-up appt as previously scheduled.  Pt verbalized understanding.

## 2017-05-01 ENCOUNTER — Ambulatory Visit (INDEPENDENT_AMBULATORY_CARE_PROVIDER_SITE_OTHER): Payer: Medicare Other | Admitting: *Deleted

## 2017-05-01 DIAGNOSIS — Z5181 Encounter for therapeutic drug level monitoring: Secondary | ICD-10-CM

## 2017-05-01 DIAGNOSIS — I4891 Unspecified atrial fibrillation: Secondary | ICD-10-CM | POA: Diagnosis not present

## 2017-05-01 LAB — POCT INR: INR: 2.9

## 2017-06-03 DIAGNOSIS — M1712 Unilateral primary osteoarthritis, left knee: Secondary | ICD-10-CM | POA: Insufficient documentation

## 2017-06-03 DIAGNOSIS — L239 Allergic contact dermatitis, unspecified cause: Secondary | ICD-10-CM | POA: Insufficient documentation

## 2017-06-03 DIAGNOSIS — M25562 Pain in left knee: Secondary | ICD-10-CM | POA: Insufficient documentation

## 2017-06-05 ENCOUNTER — Ambulatory Visit (INDEPENDENT_AMBULATORY_CARE_PROVIDER_SITE_OTHER): Payer: Medicare Other

## 2017-06-05 DIAGNOSIS — Z5181 Encounter for therapeutic drug level monitoring: Secondary | ICD-10-CM

## 2017-06-05 DIAGNOSIS — I4891 Unspecified atrial fibrillation: Secondary | ICD-10-CM

## 2017-06-05 LAB — POCT INR: INR: 2.4

## 2017-06-11 DIAGNOSIS — R432 Parageusia: Secondary | ICD-10-CM | POA: Insufficient documentation

## 2017-07-17 ENCOUNTER — Ambulatory Visit (INDEPENDENT_AMBULATORY_CARE_PROVIDER_SITE_OTHER): Payer: Medicare Other | Admitting: Pharmacist

## 2017-07-17 DIAGNOSIS — Z5181 Encounter for therapeutic drug level monitoring: Secondary | ICD-10-CM | POA: Diagnosis not present

## 2017-07-17 DIAGNOSIS — I4891 Unspecified atrial fibrillation: Secondary | ICD-10-CM | POA: Diagnosis not present

## 2017-07-17 LAB — POCT INR: INR: 2.4

## 2017-07-22 ENCOUNTER — Other Ambulatory Visit: Payer: Self-pay | Admitting: Internal Medicine

## 2017-07-28 ENCOUNTER — Other Ambulatory Visit: Payer: Self-pay | Admitting: Internal Medicine

## 2017-08-03 ENCOUNTER — Other Ambulatory Visit: Payer: Self-pay | Admitting: Internal Medicine

## 2017-08-04 NOTE — Telephone Encounter (Signed)
Please call office and schedule appointment for further refills [phone]

## 2017-08-05 ENCOUNTER — Telehealth: Payer: Self-pay | Admitting: Internal Medicine

## 2017-08-26 ENCOUNTER — Ambulatory Visit (INDEPENDENT_AMBULATORY_CARE_PROVIDER_SITE_OTHER): Payer: Medicare Other | Admitting: Nurse Practitioner

## 2017-08-26 ENCOUNTER — Ambulatory Visit (INDEPENDENT_AMBULATORY_CARE_PROVIDER_SITE_OTHER): Payer: Medicare Other | Admitting: *Deleted

## 2017-08-26 ENCOUNTER — Encounter: Payer: Self-pay | Admitting: Nurse Practitioner

## 2017-08-26 VITALS — BP 136/64 | HR 60 | Ht 61.0 in | Wt 274.8 lb

## 2017-08-26 DIAGNOSIS — I480 Paroxysmal atrial fibrillation: Secondary | ICD-10-CM | POA: Diagnosis not present

## 2017-08-26 DIAGNOSIS — Z5181 Encounter for therapeutic drug level monitoring: Secondary | ICD-10-CM

## 2017-08-26 DIAGNOSIS — G4733 Obstructive sleep apnea (adult) (pediatric): Secondary | ICD-10-CM | POA: Diagnosis not present

## 2017-08-26 DIAGNOSIS — I4891 Unspecified atrial fibrillation: Secondary | ICD-10-CM

## 2017-08-26 DIAGNOSIS — I10 Essential (primary) hypertension: Secondary | ICD-10-CM

## 2017-08-26 DIAGNOSIS — Z9989 Dependence on other enabling machines and devices: Secondary | ICD-10-CM | POA: Diagnosis not present

## 2017-08-26 LAB — POCT INR: INR: 3

## 2017-08-26 MED ORDER — EPLERENONE 25 MG PO TABS: 25.0000 mg | ORAL_TABLET | Freq: Every day | ORAL | 11 refills | Status: DC

## 2017-08-26 NOTE — Progress Notes (Signed)
Electrophysiology Office Note Date: 08/26/2017  ID:  [name], [name] 11-18-1940, MRN [id number]  PCP: Kristopher Glee., MD Electrophysiologist: Caryl Comes  CC: AF follow up  [name] is a 76 y.o. female seen today for Dr Caryl Comes.  She presents today for routine electrophysiology followup.  Since last being seen in our clinic, the patient reports doing very well. She did have one 4 hour episode of AF on Saturday after arguing with her sister.  This terminated on its own.  She denies chest pain, dyspnea, PND, orthopnea, nausea, vomiting, dizziness, syncope, weight gain, or early satiety.  Past Medical History:  Diagnosis Date  . Arthritis   . Back pain   . Chronic anticoagulation    due to aflutter  . Chronic kidney disease   . Diabetes mellitus   . Diastolic CHF, chronic ([place])    a.  echo 2006 - ef 55-65%; mild diast dysfxn;    b. Echo 08/2011: Mild LVH, EF 60%;  c. 04/2013 Echo: EF 65-69%, mild conc LVH;  08/2014 Echo: EF 60-65%, mild-mod MR.  . Gout   . Hyperlipidemia   . Hypertension    a.  Renal arterial Dopplers 12/2011: 1-59% right renal artery stenosis  . Morbid obesity ([place])   . Obstructive sleep apnea on CPAP   . Paroxysmal Afib/Flutter    a. 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   Past Surgical History:  Procedure Laterality Date  . APPENDECTOMY    . CHOLECYSTECTOMY    . TONSILLECTOMY  1982  . TOTAL ABDOMINAL HYSTERECTOMY      Current Outpatient Medications  Medication Sig Dispense Refill  . allopurinol (ZYLOPRIM) 300 MG tablet Take 300 mg by mouth daily.     Marland Kitchen amiodarone (PACERONE) 200 MG tablet Take 1 tablet (200mg ) by mouth daily Monday-Friday only. 90 tablet 1  . amLODipine (NORVASC) 5 MG tablet Take 1 tablet (5 mg total) by mouth daily. 30 tablet 10  . ciprofloxacin (CIPRO) 500 MG tablet 1 tablet 2 (two) times daily.  0  . cloNIDine (CATAPRES) 0.2 MG tablet Take 0.2 mg by mouth 2 (two) times daily.    Marland Kitchen diltiazem (CARDIZEM) 30 MG tablet Cardizem  30mg  -- take 1 tablet every 4 hours AS NEEDED for heart rate >100 as long as blood pressure >100. 45 tablet 3  . eplerenone (INSPRA) 25 MG tablet TAKE 1 TABLET BY MOUTH ONCE DAILY 15 tablet 0  . fexofenadine (ALLEGRA) 180 MG tablet Take 180 mg by mouth daily.    . furosemide (LASIX) 40 MG tablet Take 40 mg by mouth daily.     Marland Kitchen HYDROcodone-acetaminophen (NORCO/VICODIN) 5-325 MG tablet Take 1 tablet by mouth every 4 (four) hours as needed for moderate pain. 10 tablet 0  . Insulin Detemir (LEVEMIR) 100 UNIT/ML Pen Inject 20 Units into the skin daily before breakfast.    . Insulin Lispro Prot & Lispro (HUMALOG 75/25 MIX) (75-25) 100 UNIT/ML Kwikpen Inject insulin according to sliding scale maximum dose 56. Before breakfast and before dinner    . Insulin Pen Needle 31G X 5 MM MISC by Does not apply route as directed.    Marland Kitchen levothyroxine (SYNTHROID, LEVOTHROID) 75 MCG tablet Take 75 mcg daily before breakfast by mouth.     Marland Kitchen lisinopril (PRINIVIL,ZESTRIL) 20 MG tablet Take 1 tablet (20 mg total) by mouth daily. 30 tablet 11  . rosuvastatin (CRESTOR) 10 MG tablet Take 10 mg by mouth every evening.    . sitaGLIPtin (JANUVIA) 100 MG tablet  Take 100 mg daily by mouth.    . warfarin (COUMADIN) 5 MG tablet TAKE 1 TABLET BY MOUTH DAILY AS DIRECTED 30 tablet 3   No current facility-administered medications for this visit.     Allergies:   Metformin   Social History: Social History   Socioeconomic History  . Marital status: Married    Spouse name: Not on file  . Number of children: Y  . Years of education: Not on file  . Highest education level: Not on file  Social Needs  . Financial resource strain: Not on file  . Food insecurity - worry: Not on file  . Food insecurity - inability: Not on file  . Transportation needs - medical: Not on file  . Transportation needs - non-medical: Not on file  Occupational History  . Occupation: DISABILITY/housewife    Employer: RETIRED  Tobacco Use  . Smoking  status: Never Smoker  . Smokeless tobacco: Never Used  Substance and Sexual Activity  . Alcohol use: No  . Drug use: No  . Sexual activity: Yes  Other Topics Concern  . Not on file  Social History Narrative  . Not on file    Family History: Family History  Problem Relation Age of Onset  . Heart disease Father   . Hypertension Father   . Breast cancer Sister   . Cancer Sister        breast    Review of Systems: All other systems reviewed and are otherwise negative except as noted above.   Physical Exam: VS:  BP 136/64   Pulse 60   Ht 5\' 1"  (1.549 m)   Wt 274 lb 12.8 oz (124.6 kg)   BMI 51.92 kg/m  , BMI Body mass index is 51.92 kg/m. Wt Readings from Last 3 Encounters:  08/26/17 274 lb 12.8 oz (124.6 kg)  03/11/17 266 lb (120.7 kg)  02/05/17 257 lb (116.6 kg)    GEN- The patient is obese appearing, alert and oriented x 3 today.   HEENT: normocephalic, atraumatic; sclera clear, conjunctiva pink; hearing intact; oropharynx clear; neck supple  Lungs- Clear to ausculation bilaterally, normal work of breathing.  No wheezes, rales, rhonchi Heart- Regular rate and rhythm  GI- soft, non-tender, non-distended, bowel sounds present  Extremities- no clubbing, cyanosis, +depedent BLE edema MS- no significant deformity or atrophy Skin- warm and dry, no rash or lesion  Psych- euthymic mood, full affect Neuro- strength and sensation are intact   EKG:  EKG is not ordered today.  Recent Labs: No results found for requested labs within last 8760 hours.    Other studies Reviewed: Additional studies/ records that were reviewed today include: AF clinic notes, Dr Olin Pia office notes   Assessment and Plan:  1.  Paroxysmal atrial fibrillation Maintaining SR by symptoms today aside from one episode on Saturday. We reviewed use of prn Cardizem.  Burden is very low Continue low dose amio Continue Warfarin for CHADS2VASC of 4 CBC and amio labs followed by PCP  2.   OSA Compliance with CPAP encouraged  3.  HTN Stable No change required today  4.  Obesity Weight loss encouraged Body mass index is 51.92 kg/m.     Current medicines are reviewed at length with the patient today.   The patient does not have concerns regarding her medicines.  The following changes were made today:  none  Labs/ tests ordered today include: none No orders of the defined types were placed in this encounter.  Disposition:   Follow up with Dr Caryl Comes 1 year      Signed, Chanetta Marshall, NP 08/26/2017 8:30 AM   [address] [address] [phone] (office) [phone] (fax)

## 2017-08-26 NOTE — Patient Instructions (Signed)
Medication Instructions:   Your physician recommends that you continue on your current medications as directed. Please refer to the Current Medication list given to you today.   If you need a refill on your cardiac medications before your next appointment, please call your pharmacy.  Labwork: NONE ORDERED  TODAY    Testing/Procedures: NONE ORDERED  TODAY    Follow-Up: Your physician wants you to follow-up in: ONE YEAR WITH  KLEIN   You will receive a reminder letter in the mail two months in advance. If you don't receive a letter, please call our office to schedule the follow-up appointment.     Any Other Special Instructions Will Be Listed Below (If Applicable).                                                                                                                                                    

## 2017-09-03 ENCOUNTER — Ambulatory Visit (HOSPITAL_COMMUNITY): Payer: Medicare Other | Admitting: Nurse Practitioner

## 2017-09-30 ENCOUNTER — Telehealth: Payer: Self-pay | Admitting: Pharmacist

## 2017-09-30 NOTE — Telephone Encounter (Signed)
Pt [name] to report that started Augmentin.   Spoke with pt and advised ok with Coumadin and keep appt as scheduled.

## 2017-10-06 ENCOUNTER — Ambulatory Visit (INDEPENDENT_AMBULATORY_CARE_PROVIDER_SITE_OTHER): Payer: Medicare Other | Admitting: Pharmacist

## 2017-10-06 DIAGNOSIS — Z5181 Encounter for therapeutic drug level monitoring: Secondary | ICD-10-CM | POA: Diagnosis not present

## 2017-10-06 DIAGNOSIS — I4891 Unspecified atrial fibrillation: Secondary | ICD-10-CM | POA: Diagnosis not present

## 2017-10-06 LAB — POCT INR: INR: 2

## 2017-10-06 NOTE — Patient Instructions (Signed)
Description   Continue on same dosage 1 tablet everyday except 1/2 tablet on Wednesdays and Fridays.  Recheck INR in 6 weeks.  Call with any new medications of if scheduled for any procedures [phone]     

## 2017-10-14 NOTE — Telephone Encounter (Signed)
Routed to me to sign encounter No notes documented

## 2017-10-28 ENCOUNTER — Other Ambulatory Visit: Payer: Self-pay | Admitting: Internal Medicine

## 2017-10-28 DIAGNOSIS — Z1231 Encounter for screening mammogram for malignant neoplasm of breast: Secondary | ICD-10-CM

## 2017-11-17 ENCOUNTER — Ambulatory Visit (INDEPENDENT_AMBULATORY_CARE_PROVIDER_SITE_OTHER): Payer: Medicare Other | Admitting: *Deleted

## 2017-11-17 DIAGNOSIS — I4891 Unspecified atrial fibrillation: Secondary | ICD-10-CM

## 2017-11-17 DIAGNOSIS — Z5181 Encounter for therapeutic drug level monitoring: Secondary | ICD-10-CM | POA: Diagnosis not present

## 2017-11-17 LAB — POCT INR: INR: 2.9

## 2017-11-17 NOTE — Patient Instructions (Signed)
Description   Continue on same dosage 1 tablet everyday except 1/2 tablet on Wednesdays and Fridays.  Recheck INR in 6 weeks.  Call with any new medications of if scheduled for any procedures [phone]     

## 2017-11-30 ENCOUNTER — Telehealth: Payer: Self-pay | Admitting: *Deleted

## 2017-11-30 NOTE — Telephone Encounter (Signed)
Pt left msg of voicemail & stated that she was prescribed Cephalexin 500mg  bid, called pt back & instructed her that the med is safe to take with Coumadin & she verbalized understanding.

## 2017-12-07 ENCOUNTER — Ambulatory Visit
Admission: RE | Admit: 2017-12-07 | Discharge: 2017-12-07 | Disposition: A | Discharge status: Home / Self Care | Payer: Medicare Other | Source: Ambulatory Visit | Attending: Internal Medicine | Admitting: Internal Medicine

## 2017-12-07 DIAGNOSIS — Z1231 Encounter for screening mammogram for malignant neoplasm of breast: Secondary | ICD-10-CM

## 2017-12-08 ENCOUNTER — Other Ambulatory Visit: Payer: Self-pay | Admitting: Internal Medicine

## 2017-12-25 ENCOUNTER — Other Ambulatory Visit (HOSPITAL_COMMUNITY): Payer: Self-pay | Admitting: *Deleted

## 2017-12-25 MED ORDER — DILTIAZEM HCL 30 MG PO TABS: ORAL_TABLET | ORAL | 3 refills | Status: DC

## 2017-12-29 ENCOUNTER — Encounter (INDEPENDENT_AMBULATORY_CARE_PROVIDER_SITE_OTHER): Payer: Self-pay

## 2017-12-29 ENCOUNTER — Ambulatory Visit (INDEPENDENT_AMBULATORY_CARE_PROVIDER_SITE_OTHER): Payer: Medicare Other | Admitting: *Deleted

## 2017-12-29 DIAGNOSIS — Z5181 Encounter for therapeutic drug level monitoring: Secondary | ICD-10-CM | POA: Diagnosis not present

## 2017-12-29 DIAGNOSIS — I4891 Unspecified atrial fibrillation: Secondary | ICD-10-CM | POA: Diagnosis not present

## 2017-12-29 LAB — POCT INR: INR: 3.9

## 2017-12-29 NOTE — Patient Instructions (Signed)
Description   Do not take coumadin today March 12th then continue on same dosage 1 tablet everyday except 1/2 tablet on Wednesdays and Fridays.  Recheck INR in 2 weeks. Call with any new medications of if scheduled for any procedures [phone]

## 2018-01-13 ENCOUNTER — Other Ambulatory Visit: Payer: Self-pay

## 2018-01-13 ENCOUNTER — Encounter (HOSPITAL_COMMUNITY): Payer: Self-pay

## 2018-01-13 ENCOUNTER — Emergency Department (HOSPITAL_COMMUNITY)
Admission: EM | Admit: 2018-01-13 | Discharge: 2018-01-13 | Disposition: A | Discharge status: Home / Self Care | Payer: Medicare Other | Attending: Emergency Medicine | Admitting: Emergency Medicine

## 2018-01-13 ENCOUNTER — Emergency Department (HOSPITAL_COMMUNITY): Payer: Medicare Other

## 2018-01-13 DIAGNOSIS — Z7901 Long term (current) use of anticoagulants: Secondary | ICD-10-CM | POA: Insufficient documentation

## 2018-01-13 DIAGNOSIS — N189 Chronic kidney disease, unspecified: Secondary | ICD-10-CM | POA: Insufficient documentation

## 2018-01-13 DIAGNOSIS — Z79899 Other long term (current) drug therapy: Secondary | ICD-10-CM | POA: Insufficient documentation

## 2018-01-13 DIAGNOSIS — R51 Headache: Secondary | ICD-10-CM | POA: Insufficient documentation

## 2018-01-13 DIAGNOSIS — I5032 Chronic diastolic (congestive) heart failure: Secondary | ICD-10-CM | POA: Diagnosis not present

## 2018-01-13 DIAGNOSIS — E1122 Type 2 diabetes mellitus with diabetic chronic kidney disease: Secondary | ICD-10-CM | POA: Insufficient documentation

## 2018-01-13 DIAGNOSIS — Z794 Long term (current) use of insulin: Secondary | ICD-10-CM | POA: Insufficient documentation

## 2018-01-13 DIAGNOSIS — I130 Hypertensive heart and chronic kidney disease with heart failure and stage 1 through stage 4 chronic kidney disease, or unspecified chronic kidney disease: Secondary | ICD-10-CM | POA: Diagnosis not present

## 2018-01-13 DIAGNOSIS — I10 Essential (primary) hypertension: Secondary | ICD-10-CM

## 2018-01-13 LAB — CBC
HCT: 43.4 % (ref 36.0–46.0)
Hemoglobin: 13.5 g/dL (ref 12.0–15.0)
MCH: 26.2 pg (ref 26.0–34.0)
MCHC: 31.1 g/dL (ref 30.0–36.0)
MCV: 84.3 fL (ref 78.0–100.0)
PLATELETS: 299 10*3/uL (ref 150–400)
RBC: 5.15 MIL/uL — ABNORMAL HIGH (ref 3.87–5.11)
RDW: 16.9 % — AB (ref 11.5–15.5)
WBC: 14.2 10*3/uL — AB (ref 4.0–10.5)

## 2018-01-13 LAB — COMPREHENSIVE METABOLIC PANEL
ALT: 12 U/L — ABNORMAL LOW (ref 14–54)
ANION GAP: 13 (ref 5–15)
AST: 33 U/L (ref 15–41)
Albumin: 3.5 g/dL (ref 3.5–5.0)
Alkaline Phosphatase: 97 U/L (ref 38–126)
BUN: 18 mg/dL (ref 6–20)
CALCIUM: 8.8 mg/dL — AB (ref 8.9–10.3)
CHLORIDE: 104 mmol/L (ref 101–111)
CO2: 20 mmol/L — AB (ref 22–32)
Creatinine, Ser: 0.83 mg/dL (ref 0.44–1.00)
GFR calc non Af Amer: >60 mL/min (ref >60)
Glucose, Bld: 190 mg/dL — ABNORMAL HIGH (ref 65–99)
POTASSIUM: 5.2 mmol/L — AB (ref 3.5–5.1)
SODIUM: 137 mmol/L (ref 135–145)
Total Bilirubin: 2.4 mg/dL — ABNORMAL HIGH (ref 0.3–1.2)
Total Protein: 6.7 g/dL (ref 6.5–8.1)

## 2018-01-13 LAB — APTT: aPTT: 35 seconds (ref 24–36)

## 2018-01-13 LAB — DIFFERENTIAL
Basophils Absolute: 0 10*3/uL (ref 0.0–0.1)
Basophils Relative: 0 %
Eosinophils Absolute: 0.2 10*3/uL (ref 0.0–0.7)
Eosinophils Relative: 2 %
Lymphocytes Relative: 23 %
Lymphs Abs: 3.2 10*3/uL (ref 0.7–4.0)
Monocytes Absolute: 1.2 10*3/uL — ABNORMAL HIGH (ref 0.1–1.0)
Monocytes Relative: 9 %
Neutro Abs: 9.5 10*3/uL — ABNORMAL HIGH (ref 1.7–7.7)
Neutrophils Relative %: 66 %

## 2018-01-13 LAB — I-STAT TROPONIN, ED: Troponin i, poc: 0 ng/mL (ref 0.00–0.08)

## 2018-01-13 LAB — CBG MONITORING, ED: Glucose-Capillary: 228 mg/dL — ABNORMAL HIGH (ref 65–99)

## 2018-01-13 LAB — PROTIME-INR
INR: 1.97
Prothrombin Time: 22.3 seconds — ABNORMAL HIGH (ref 11.4–15.2)

## 2018-01-13 NOTE — Discharge Instructions (Addendum)
YOU CAN START TAKING YOUR CLONIDINE PILL 1 PILL EVERY 8 HOURS (3 TIMES A DAY) INSTEAD OF 2 UNTIL YOU FOLLOW UP WITH YOUR PRIMARY CARE PROVIDER. SEE YOUR DOCTOR TO DISCUSS YOUR BLOOD PRESSURE MEDICINES. RETURN TO ER IF ANY SUDDEN HEADACHE, CHANGES IN VISION, WEAKNESS/NUMBNESS OF ARM/LEG/FACE, OR PROBLEMS WITH SPEECH.

## 2018-01-13 NOTE — ED Notes (Signed)
Pt ambulated to bathroom standby assist. Tolerated well.

## 2018-01-13 NOTE — ED Provider Notes (Signed)
[facility] EMERGENCY DEPARTMENT Provider Note   CSN: [id number] Arrival date & time: 01/13/18  1019     History   Chief Complaint No chief complaint on file.   HPI [name] is a 77 y.o. female.  77 year old female with past medical history including CHF, CKD, type 2 diabetes mellitus, hypertension, OSA, atrial fibrillation on Coumadin who presents with hypertension.  3 days ago around 4 PM, she had a gradual onset of very mild headache.  Later that evening she began feeling some tingling on the right side of her head.  She noted that her blood pressure was elevated at 210/89.  She reports being compliant with all of her medications.  She went to an urgent care the next day and because of her complaints was told to go to the ER but she could not do so because she is caring for her husband who had an accident recently.  Yesterday she began feeling like her blood pressure was elevated again and she noted it was 187/82.  She finally came in today after she was able to get her husband settled at home.  She denies any vision changes, extremity numbness/weakness, problems with speech, confusion, headache, chest pain, or shortness of breath.  The history is provided by the patient.    Past Medical History:  Diagnosis Date  . Arthritis   . Back pain   . Chronic anticoagulation    due to aflutter  . Chronic kidney disease   . Diabetes mellitus   . Diastolic CHF, chronic ([place])    a.  echo 2006 - ef 55-65%; mild diast dysfxn;    b. Echo 08/2011: Mild LVH, EF 60%;  c. 04/2013 Echo: EF 65-69%, mild conc LVH;  08/2014 Echo: EF 60-65%, mild-mod MR.  . Gout   . Hyperlipidemia   . Hypertension    a.  Renal arterial Dopplers 12/2011: 1-59% right renal artery stenosis  . Morbid obesity ([place])   . Obstructive sleep apnea on CPAP   . Paroxysmal Afib/Flutter    a. 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    Patient Active Problem List   Diagnosis Date Noted  . Obesity  12/19/2015  . Hypersomnia 12/19/2015  . Encounter for therapeutic drug monitoring 12/02/2013  . Dyspnea 10/03/2013  . Acute diastolic congestive heart failure, NYHA class 3 ([place]) 10/03/2013  . Hypokalemia 10/03/2013  . Prolonged QT interval 10/03/2013  . Acute diastolic heart failure ([place]) 10/03/2013  . Elevated transaminase level 08/11/2013  . CAP (community acquired pneumonia) 04/21/2013  . Postural dizziness 04/05/2013  . Chest pain 11/11/2012  . Long term (current) use of anticoagulants 06/09/2012  . Leukocytosis 04/09/2012  . Sinus bradycardia 10/01/2011  . Back pain   . Lumbar strain 08/19/2011  . BENIGN NEOPLASM OF ADRENAL GLAND 11/25/2010  . Chronic diastolic heart failure ([place]) 02/20/2010  . DM 05/16/2009  . GOUT 05/16/2009  . OBESITY, MORBID 05/16/2009  . Essential hypertension 05/16/2009  . ATRIAL FIBRILLATION, PAROXYSMAL 05/16/2009  . SYNCOPE, HX OF 05/16/2009  . ACHILLES TENDINITIS 01/22/2009  . HYPERLIPIDEMIA 11/30/2008  . Obstructive sleep apnea 11/30/2008    Past Surgical History:  Procedure Laterality Date  . APPENDECTOMY    . ATRIAL FLUTTER ABLATION N/A 09/24/2011   Procedure: ATRIAL FLUTTER ABLATION;  Surgeon: [name], MD;  Location[facility] C[facility] LAB;  Service: Cardiovascular;  Laterality: N/A;  . CARDIOVERSION  10/22/2011   Procedure: CARDIOVERSION;  Surgeon: [name], MD;  Location: [facility];  Service: Cardiovascular;  Laterality: N/A;  . CARDIOVERSION N/A 09/10/2011   Procedure: CARDIOVERSION;  Surgeon: [name], MD;  Location: [facility] CATH LAB;  Service: Cardiovascular;  Laterality: N/A;  . CHOLECYSTECTOMY    . TONSILLECTOMY  1982  . TOTAL ABDOMINAL HYSTERECTOMY       OB History   None      Home Medications    Prior to Admission medications   Medication Sig Start Date End Date Taking? Authorizing Provider  allopurinol (ZYLOPRIM) 300 MG tablet Take 300 mg by mouth daily.  07/07/13   [provider]  amiodarone (PACERONE) 200 MG  tablet Take 1 tablet (200mg ) by mouth daily Monday-Friday only. 03/10/17   Sherran Needs, NP  amLODipine (NORVASC) 5 MG tablet Take 1 tablet (5 mg total) by mouth daily. 07/14/16   [name], MD  cloNIDine (CATAPRES) 0.2 MG tablet Take 0.2 mg by mouth 2 (two) times daily.    [provider]  diltiazem (CARDIZEM) 30 MG tablet Cardizem 30mg  -- take 1 tablet every 4 hours AS NEEDED for heart rate >100 as long as blood pressure >100. 12/25/17   Sherran Needs, NP  eplerenone (INSPRA) 25 MG tablet Take 1 tablet (25 mg total) daily by mouth. 08/26/17   Chanetta Marshall K, NP  fexofenadine (ALLEGRA) 180 MG tablet Take 180 mg by mouth daily.    [provider]  furosemide (LASIX) 40 MG tablet Take 40 mg by mouth daily.  07/09/15   [name], MD  HYDROcodone-acetaminophen (NORCO/VICODIN) 5-325 MG tablet Take 1 tablet by mouth every 4 (four) hours as needed for moderate pain. 01/11/17   Jola Schmidt, MD  Insulin Detemir (LEVEMIR) 100 UNIT/ML Pen Inject 20 Units into the skin daily before breakfast.    [provider]  Insulin Lispro Prot & Lispro (HUMALOG 75/25 MIX) (75-25) 100 UNIT/ML Kwikpen Inject insulin according to sliding scale maximum dose 56. Before breakfast and before dinner 08/12/16   [provider]  Insulin Pen Needle 31G X 5 MM MISC by Does not apply route as directed.    [provider]  ketorolac (ACULAR) 0.5 % ophthalmic solution Place 1 drop 2 (two) times daily into the right eye.    [provider]  levothyroxine (SYNTHROID, LEVOTHROID) 75 MCG tablet Take 75 mcg daily before breakfast by mouth.  12/29/16   [provider]  lisinopril (PRINIVIL,ZESTRIL) 20 MG tablet Take 1 tablet (20 mg total) by mouth daily. 03/19/16   [name], MD  rosuvastatin (CRESTOR) 10 MG tablet Take 10 mg by mouth every evening.    [provider]  sitaGLIPtin (JANUVIA) 100 MG tablet Take 100 mg daily by mouth. 06/24/17   [provider]  tobramycin-dexamethasone [facility]) ophthalmic ointment Place 1 application 2 (two) times daily into the right eye.    [provider]  warfarin (COUMADIN) 5 MG tablet TAKE 1 TABLET BY MOUTH DAILY AS DIRECTED 12/08/17   [name], MD    Family History Family History  Problem Relation Age of Onset  . Heart disease Father   . Hypertension Father   . Breast cancer Sister   . Cancer Sister        breast    Social History Social History   Tobacco Use  . Smoking status: Never Smoker  . Smokeless tobacco: Never Used  Substance Use Topics  . Alcohol use: No  . Drug use: No     Allergies   Metformin   Review  of Systems Review of Systems All other systems reviewed and are negative except that which was mentioned in HPI   Physical Exam Updated Vital Signs BP (!) 157/54 (BP Location: Right Arm)   Pulse 64   Temp 97.7 F (36.5 C) (Oral)   Resp 18   SpO2 100%   Physical Exam  Constitutional: She is oriented to person, place, and time. She appears well-developed and well-nourished. No distress.  Awake, alert  HENT:  Head: Normocephalic and atraumatic.  Eyes: Pupils are equal, round, and reactive to light. Conjunctivae and EOM are normal.  Neck: Neck supple.  Cardiovascular: Normal rate, regular rhythm and normal heart sounds.  No murmur heard. Pulmonary/Chest: Effort normal and breath sounds normal. No respiratory distress.  Abdominal: Soft. Bowel sounds are normal. She exhibits no distension. There is no tenderness.  Musculoskeletal: She exhibits no edema.  Neurological: She is alert and oriented to person, place, and time. She has normal reflexes. No cranial nerve deficit. She exhibits normal muscle tone.  Fluent speech, normal finger-to-nose testing, negative pronator drift, no clonus 5/5 strength and normal sensation x all 4 extremities  Skin: Skin is warm and dry.  Psychiatric: Judgment and thought content normal.  Anxious but very  pleasant  Nursing note and vitals reviewed.    ED Treatments / Results  Labs (all labs ordered are listed, but only abnormal results are displayed) Labs Reviewed  PROTIME-INR - Abnormal; Notable for the following components:      Result Value   Prothrombin Time 22.3 (*)    All other components within normal limits  CBC - Abnormal; Notable for the following components:   WBC 14.2 (*)    RBC 5.15 (*)    RDW 16.9 (*)    All other components within normal limits  DIFFERENTIAL - Abnormal; Notable for the following components:   Neutro Abs 9.5 (*)    Monocytes Absolute 1.2 (*)    All other components within normal limits  COMPREHENSIVE METABOLIC PANEL - Abnormal; Notable for the following components:   Potassium 5.2 (*)    CO2 20 (*)    Glucose, Bld 190 (*)    Calcium 8.8 (*)    ALT 12 (*)    Total Bilirubin 2.4 (*)    All other components within normal limits  CBG MONITORING, ED - Abnormal; Notable for the following components:   Glucose-Capillary 228 (*)    All other components within normal limits  APTT  I-STAT TROPONIN, ED    EKG EKG Interpretation  Date/Time:  Wednesday January 13 2018 10:40:29 EDT Ventricular Rate:  66 PR Interval:    QRS Duration: 80 QT Interval:  434 QTC Calculation: 454 R Axis:   -14 Text Interpretation:  Normal sinus rhythm No significant change since last tracing Confirmed by Pattricia Boss [phone]) on 01/13/2018 10:45:26 AM   Radiology Ct Head Wo Contrast  Result Date: 01/13/2018 CLINICAL DATA:  High blood pressure since Sunday, with numbness down right side of face and right eye visual disturbance Patient is on coumadin EXAM: CT HEAD WITHOUT CONTRAST TECHNIQUE: Contiguous axial images were obtained from the base of the skull through the vertex without intravenous contrast. COMPARISON:  Head CTs dated 08/04/2017 and 04/05/2013. FINDINGS: Brain: Mild chronic small vessel ischemic change noted within the bilateral periventricular and subcortical  white matter regions. There is no mass, hemorrhage, edema or other evidence of acute parenchymal abnormality. No extra-axial hemorrhage. Vascular: There are chronic calcified atherosclerotic changes of the large vessels at the  skull base. No unexpected hyperdense vessel. Skull: Normal. Negative for fracture or focal lesion. Sinuses/Orbits: Mucosal thickening within the maxillary sinuses bilaterally, likely chronic. No fluid levels or other signs of an acute sinusitis. Other: None. IMPRESSION: No acute findings.  No intracranial mass, hemorrhage or edema. Electronically Signed   By: Franki Cabot M.D.   On: 01/13/2018 11:11    Procedures Procedures (including critical care time)  Medications Ordered in ED Medications - No data to display   Initial Impression / Assessment and Plan / ED Course  I have reviewed the triage vital signs and the nursing notes.  Pertinent labs & imaging results that were available during my care of the patient were reviewed by me and considered in my medical decision making (see chart for details).    PT was anxious but well appearing on exam. BP during my eval was 157/54. EKG without ischemic changes.  CT obtained from triage was normal.  Lab work overall reassuring.  I informed her of her INR of 1.97; she is following up in coumadin clinic tomorrow. WBC 14.2 but patient denies any infectious symptoms.  She mainly is concerned about her elevated blood pressure.  I noted that her clonidine is dosed only twice daily and I instructed her that she could take it 3 times daily until she sees PCP in follow up.  Extensively reviewed return precautions including any neurologic symptoms or sudden onset of symptoms.  She voiced understanding and was discharged in satisfactory condition.  Final Clinical Impressions(s) / ED Diagnoses   Final diagnoses:  Essential hypertension    ED Discharge Orders    None       Little, Wenda Overland, MD 01/13/18 [phone]

## 2018-01-14 ENCOUNTER — Ambulatory Visit (INDEPENDENT_AMBULATORY_CARE_PROVIDER_SITE_OTHER): Payer: Medicare Other | Admitting: *Deleted

## 2018-01-14 DIAGNOSIS — I4891 Unspecified atrial fibrillation: Secondary | ICD-10-CM

## 2018-01-14 DIAGNOSIS — Z5181 Encounter for therapeutic drug level monitoring: Secondary | ICD-10-CM | POA: Diagnosis not present

## 2018-01-14 LAB — POCT INR: INR: 2.2

## 2018-01-14 NOTE — Patient Instructions (Signed)
Description   Continue on same dosage 1 tablet everyday except 1/2 tablet on Wednesdays and Fridays.  Recheck INR in 3 weeks. Call with any new medications of if scheduled for any procedures [phone]

## 2018-01-15 ENCOUNTER — Other Ambulatory Visit: Payer: Self-pay | Admitting: Nurse Practitioner

## 2018-01-20 ENCOUNTER — Emergency Department (HOSPITAL_COMMUNITY): Payer: Medicare Other

## 2018-01-20 ENCOUNTER — Emergency Department (HOSPITAL_COMMUNITY)
Admission: EM | Admit: 2018-01-20 | Discharge: 2018-01-20 | Disposition: A | Discharge status: Home / Self Care | Payer: Medicare Other | Attending: Emergency Medicine | Admitting: Emergency Medicine

## 2018-01-20 ENCOUNTER — Encounter (HOSPITAL_COMMUNITY): Payer: Self-pay | Admitting: Emergency Medicine

## 2018-01-20 DIAGNOSIS — I130 Hypertensive heart and chronic kidney disease with heart failure and stage 1 through stage 4 chronic kidney disease, or unspecified chronic kidney disease: Secondary | ICD-10-CM | POA: Diagnosis not present

## 2018-01-20 DIAGNOSIS — J181 Lobar pneumonia, unspecified organism: Secondary | ICD-10-CM | POA: Diagnosis not present

## 2018-01-20 DIAGNOSIS — I5032 Chronic diastolic (congestive) heart failure: Secondary | ICD-10-CM | POA: Diagnosis not present

## 2018-01-20 DIAGNOSIS — D3500 Benign neoplasm of unspecified adrenal gland: Secondary | ICD-10-CM | POA: Diagnosis not present

## 2018-01-20 DIAGNOSIS — E1122 Type 2 diabetes mellitus with diabetic chronic kidney disease: Secondary | ICD-10-CM | POA: Insufficient documentation

## 2018-01-20 DIAGNOSIS — Z79899 Other long term (current) drug therapy: Secondary | ICD-10-CM | POA: Diagnosis not present

## 2018-01-20 DIAGNOSIS — Z794 Long term (current) use of insulin: Secondary | ICD-10-CM | POA: Insufficient documentation

## 2018-01-20 DIAGNOSIS — Z7901 Long term (current) use of anticoagulants: Secondary | ICD-10-CM | POA: Diagnosis not present

## 2018-01-20 DIAGNOSIS — R05 Cough: Secondary | ICD-10-CM | POA: Diagnosis present

## 2018-01-20 DIAGNOSIS — J189 Pneumonia, unspecified organism: Secondary | ICD-10-CM

## 2018-01-20 DIAGNOSIS — N189 Chronic kidney disease, unspecified: Secondary | ICD-10-CM | POA: Insufficient documentation

## 2018-01-20 MED ORDER — MORPHINE SULFATE (PF) 2 MG/ML IV SOLN
INTRAVENOUS | Status: AC
  Filled 2018-01-20: qty 2

## 2018-01-20 MED ORDER — GUAIFENESIN ER 600 MG PO TB12: 1200.0000 mg | ORAL_TABLET | Freq: Two times a day (BID) | ORAL | 0 refills | Status: DC

## 2018-01-20 MED ORDER — CEFTRIAXONE SODIUM 1 G IJ SOLR
1.0000 g | Freq: Once | INTRAMUSCULAR | Status: AC
  Administered 2018-01-20: 1 g via INTRAVENOUS
  Filled 2018-01-20: qty 10

## 2018-01-20 NOTE — Discharge Instructions (Addendum)
Please continue to take the Doxycyline prescribed to you until you have completed the entire course. Drink plenty of fluids.  Contact a health care provider if: You have a fever. You are losing sleep because you cannot control your cough with cough medicine. Get help right away if: You have worsening shortness of breath. You have increased chest pain. Your sickness becomes worse, especially if you are an older adult or have a weakened immune system. You cough up blood.

## 2018-01-20 NOTE — ED Notes (Signed)
Pt ambulatory to restroom

## 2018-01-20 NOTE — ED Provider Notes (Signed)
[facility] DEPT Provider Note   CSN: [id number] Arrival date & time: 01/20/18  0532     History   Chief Complaint Chief Complaint  Patient presents with  . Cough    HPI [name] is a 77 y.o. female.  Who presents the emergency department for evaluation of cough.  She has had cough for the past 6 days.  She is currently taking Tessalon and over-the-counter dextromethorphan without relief of her symptoms.  Yesterday she was prescribed doxycycline has taken 2 doses.  Patient states that she is having bilateral rib cage pain from coughing which is what brought her in.  She denies any fevers, chills, nausea, vomiting.Marland Kitchen  HPI  Past Medical History:  Diagnosis Date  . Arthritis   . Back pain   . Chronic anticoagulation    due to aflutter  . Chronic kidney disease   . Diabetes mellitus   . Diastolic CHF, chronic ([place])    a.  echo 2006 - ef 55-65%; mild diast dysfxn;    b. Echo 08/2011: Mild LVH, EF 60%;  c. 04/2013 Echo: EF 65-69%, mild conc LVH;  08/2014 Echo: EF 60-65%, mild-mod MR.  . Gout   . Hyperlipidemia   . Hypertension    a.  Renal arterial Dopplers 12/2011: 1-59% right renal artery stenosis  . Morbid obesity ([place])   . Obstructive sleep apnea on CPAP   . Paroxysmal Afib/Flutter    a. 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    Patient Active Problem List   Diagnosis Date Noted  . Obesity 12/19/2015  . Hypersomnia 12/19/2015  . Encounter for therapeutic drug monitoring 12/02/2013  . Dyspnea 10/03/2013  . Acute diastolic congestive heart failure, NYHA class 3 ([place]) 10/03/2013  . Hypokalemia 10/03/2013  . Prolonged QT interval 10/03/2013  . Acute diastolic heart failure ([place]) 10/03/2013  . Elevated transaminase level 08/11/2013  . CAP (community acquired pneumonia) 04/21/2013  . Postural dizziness 04/05/2013  . Chest pain 11/11/2012  . Long term (current) use of anticoagulants 06/09/2012  . Leukocytosis 04/09/2012  . Sinus  bradycardia 10/01/2011  . Back pain   . Lumbar strain 08/19/2011  . BENIGN NEOPLASM OF ADRENAL GLAND 11/25/2010  . Chronic diastolic heart failure ([place]) 02/20/2010  . DM 05/16/2009  . GOUT 05/16/2009  . OBESITY, MORBID 05/16/2009  . Essential hypertension 05/16/2009  . ATRIAL FIBRILLATION, PAROXYSMAL 05/16/2009  . SYNCOPE, HX OF 05/16/2009  . ACHILLES TENDINITIS 01/22/2009  . HYPERLIPIDEMIA 11/30/2008  . Obstructive sleep apnea 11/30/2008    Past Surgical History:  Procedure Laterality Date  . APPENDECTOMY    . ATRIAL FLUTTER ABLATION N/A 09/24/2011   Procedure: ATRIAL FLUTTER ABLATION;  Surgeon: [name], MD;  Location: [facility] CATH LAB;  Service: Cardiovascular;  Laterality: N/A;  . CARDIOVERSION  10/22/2011   Procedure: CARDIOVERSION;  Surgeon: [name], MD;  Location: [place];  Service: Cardiovascular;  Laterality: N/A;  . CARDIOVERSION N/A 09/10/2011   Procedure: CARDIOVERSION;  Surgeon: [name], MD;  Location: [facility] CATH LAB;  Service: Cardiovascular;  Laterality: N/A;  . CHOLECYSTECTOMY    . TONSILLECTOMY  1982  . TOTAL ABDOMINAL HYSTERECTOMY       OB History   None      Home Medications    Prior to Admission medications   Medication Sig Start Date End Date Taking? Authorizing Provider  allopurinol (ZYLOPRIM) 300 MG tablet Take 300 mg by mouth daily.  07/07/13   [provider]  amiodarone (PACERONE) 200 MG  tablet TAKE 1 TABLET BY MOUTH DAILY MONDAY THROUGH FRIDAY ONLY 01/15/18   [name], MD  amLODipine (NORVASC) 5 MG tablet Take 1 tablet (5 mg total) by mouth daily. 07/14/16   [name], MD  cloNIDine (CATAPRES) 0.2 MG tablet Take 0.2 mg by mouth 2 (two) times daily.    [provider]  diltiazem (CARDIZEM) 30 MG tablet Cardizem 30mg  -- take 1 tablet every 4 hours AS NEEDED for heart rate >100 as long as blood pressure >100. 12/25/17   Sherran Needs, NP  eplerenone (INSPRA) 25 MG tablet Take 1 tablet (25 mg total) daily by  mouth. 08/26/17   Chanetta Marshall K, NP  fexofenadine (ALLEGRA) 180 MG tablet Take 180 mg by mouth daily.    [provider]  furosemide (LASIX) 40 MG tablet Take 40 mg by mouth daily.  07/09/15   [name], MD  HYDROcodone-acetaminophen (NORCO/VICODIN) 5-325 MG tablet Take 1 tablet by mouth every 4 (four) hours as needed for moderate pain. 01/11/17   Jola Schmidt, MD  Insulin Detemir (LEVEMIR) 100 UNIT/ML Pen Inject 20 Units into the skin daily before breakfast.    [provider]  Insulin Lispro Prot & Lispro (HUMALOG 75/25 MIX) (75-25) 100 UNIT/ML Kwikpen Inject insulin according to sliding scale maximum dose 56. Before breakfast and before dinner 08/12/16   [provider]  Insulin Pen Needle 31G X 5 MM MISC by Does not apply route as directed.    [provider]  ketorolac (ACULAR) 0.5 % ophthalmic solution Place 1 drop 2 (two) times daily into the right eye.    [provider]  levothyroxine (SYNTHROID, LEVOTHROID) 75 MCG tablet Take 75 mcg daily before breakfast by mouth.  12/29/16   [provider]  lisinopril (PRINIVIL,ZESTRIL) 20 MG tablet Take 1 tablet (20 mg total) by mouth daily. 03/19/16   [name], MD  rosuvastatin (CRESTOR) 10 MG tablet Take 10 mg by mouth every evening.    [provider]  sitaGLIPtin (JANUVIA) 100 MG tablet Take 100 mg daily by mouth. 06/24/17   [provider]  tobramycin-dexamethasone [facility]) ophthalmic ointment Place 1 application 2 (two) times daily into the right eye.    [provider]  warfarin (COUMADIN) 5 MG tablet TAKE 1 TABLET BY MOUTH DAILY AS DIRECTED 12/08/17   [name], MD    Family History Family History  Problem Relation Age of Onset  . Heart disease Father   . Hypertension Father   . Breast cancer Sister   . Cancer Sister        breast    Social History Social History   Tobacco Use  . Smoking status: Never Smoker  . Smokeless tobacco: Never  Used  Substance Use Topics  . Alcohol use: No  . Drug use: No     Allergies   Metformin   Review of Systems Review of Systems Ten systems reviewed and are negative for acute change, except as noted in the HPI.     Physical Exam Updated Vital Signs BP (!) 166/62 (BP Location: Left Arm)   Pulse 68   Temp 98.5 F (36.9 C) (Oral)   Resp 18   SpO2 99%   Physical Exam  Physical Exam  Nursing note and vitals reviewed. Constitutional: She is oriented to person, place, and time. She appears well-developed and well-nourished. No distress.  HENT:  Head: Normocephalic and atraumatic.  Eyes: Conjunctivae normal and EOM are normal. Pupils are  equal, round, and reactive to light. No scleral icterus.  Neck: Normal range of motion.  Cardiovascular: Normal rate, regular rhythm and normal heart sounds.  Exam reveals no gallop and no friction rub.   No murmur heard. Pulmonary/Chest: Effort normal and breath sounds normal. No respiratory distress. No abnormal breath sounds  Abdominal: Soft. Bowel sounds are normal. She exhibits no distension and no mass. There is no tenderness. There is no guarding.  Neurological: She is alert and oriented to person, place, and time.  Psych: Anxious Skin: Skin is warm and dry. She is not diaphoretic.    ED Treatments / Results  Labs (all labs ordered are listed, but only abnormal results are displayed) Labs Reviewed - No data to display  EKG None  Radiology Dg Chest 2 View  Result Date: 01/20/2018 CLINICAL DATA:  Cough for 3 weeks. Shortness of breath. Rib and chest pain. EXAM: CHEST - 2 VIEW COMPARISON:  05/01/2016 FINDINGS: Cardiac enlargement. No vascular congestion or edema. Consolidation in the right middle lobe likely representing pneumonia. Left lung is clear. No blunting of costophrenic angles. No pneumothorax. Calcification of the aorta. Degenerative changes in the spine and shoulders. IMPRESSION: Right middle lobe consolidation likely  representing pneumonia. Mild cardiac enlargement. Aortic atherosclerosis. Electronically Signed   By: Lucienne Capers M.D.   On: 01/20/2018 06:09    Procedures Procedures (including critical care time)  Medications Ordered in ED Medications - No data to display   Initial Impression / Assessment and Plan / ED Course  I have reviewed the triage vital signs and the nursing notes.  Pertinent labs & imaging results that were available during my care of the patient were reviewed by me and considered in my medical decision making (see chart for details).    Patient currently taking doxycycline and is only had 2 doses.  Her chest x-ray does show pneumonia of the right middle lobe.  This is also where she is having the predominant amount of her chest wall pain.  She is not hypoxic and has normal vital signs.  Given the fact that she is only taken 2 doses of doxycycline would not call this antibiotic failure.  We will add on 1 g of IV Rocephin she may continue with her course of antibiotics.    Patient given IV rocephin. Continue home ABX. Discussed supportive care and return precautions. Appears appropriate for dc and close PCP follow up .  Final Clinical Impressions(s) / ED Diagnoses   Final diagnoses:  Community acquired pneumonia of right middle lobe of lung [facility])    ED Discharge Orders    None       Oline, [place], PA-C 01/20/18 7017    Virgel Manifold, MD 01/21/18 [phone]

## 2018-01-20 NOTE — ED Triage Notes (Signed)
Patient here from home with complaints of constant cough. Reports that she has been taking OTC medications with no relief. Was also prescribed Benzonatate with no relief.

## 2018-01-21 ENCOUNTER — Telehealth: Payer: Self-pay | Admitting: *Deleted

## 2018-01-21 NOTE — Telephone Encounter (Signed)
Pt called stating was seen in ER on yesterday and diagnosed with pneumonia She states she started  Doxycycline 100 mg bid for 10 days on 01/19/2018  Instructed there is an interaction between coumadin and doxycycline so instructed to take doxycycline as ordered and coumadin as ordered and made an appt for her to be seen in coumadin clinic tomorrow and she states understanding

## 2018-01-22 ENCOUNTER — Ambulatory Visit (INDEPENDENT_AMBULATORY_CARE_PROVIDER_SITE_OTHER): Payer: Medicare Other | Admitting: *Deleted

## 2018-01-22 DIAGNOSIS — I4891 Unspecified atrial fibrillation: Secondary | ICD-10-CM | POA: Diagnosis not present

## 2018-01-22 DIAGNOSIS — Z5181 Encounter for therapeutic drug level monitoring: Secondary | ICD-10-CM

## 2018-01-22 LAB — POCT INR: INR: 3

## 2018-01-22 NOTE — Patient Instructions (Signed)
Description   Tomorrow 1/2 tablet then continue on same dosage 1 tablet everyday except 1/2 tablet on Wednesdays and Fridays.  Recheck INR in 3 weeks. Call with any new medications of if scheduled for any procedures [phone]

## 2018-01-26 ENCOUNTER — Telehealth: Payer: Self-pay | Admitting: *Deleted

## 2018-01-26 NOTE — Telephone Encounter (Signed)
Spoke with pt and she states she will finish Doxycycline on Thursday but has been ordered a Prednisone taper and will start that on April 10th Prednisone 5mg  take 6 tabs then next day take 5 tabs then 4 tabs then 3 tabs then 2 tabs then 1 tab .Instructed there is an interaction between coumadin and prednisone as well as Doxycycline so made an appt for her to be seen in coumadin clinic on Friday. Instructed to continue to take these medications as ordered and we will check her INR on Friday and she states understanding Pt also states has been ordered Flexeril and informed there is no interaction between coumadin and flexeril

## 2018-01-29 ENCOUNTER — Ambulatory Visit (INDEPENDENT_AMBULATORY_CARE_PROVIDER_SITE_OTHER): Payer: Medicare Other | Admitting: *Deleted

## 2018-01-29 DIAGNOSIS — I4891 Unspecified atrial fibrillation: Secondary | ICD-10-CM | POA: Diagnosis not present

## 2018-01-29 DIAGNOSIS — Z5181 Encounter for therapeutic drug level monitoring: Secondary | ICD-10-CM

## 2018-01-29 LAB — POCT INR: INR: 2.6

## 2018-01-29 NOTE — Patient Instructions (Signed)
Description   Continue on same dosage 1 tablet everyday except 1/2 tablet on Wednesdays and Fridays.  Recheck INR in 3 weeks. Call with any new medications of if scheduled for any procedures [phone]

## 2018-02-13 ENCOUNTER — Encounter (HOSPITAL_COMMUNITY): Payer: Self-pay

## 2018-02-13 ENCOUNTER — Other Ambulatory Visit: Payer: Self-pay

## 2018-02-13 ENCOUNTER — Telehealth: Payer: Self-pay | Admitting: Physician Assistant

## 2018-02-13 ENCOUNTER — Emergency Department (HOSPITAL_COMMUNITY): Payer: Medicare Other

## 2018-02-13 ENCOUNTER — Emergency Department (HOSPITAL_COMMUNITY)
Admission: EM | Admit: 2018-02-13 | Discharge: 2018-02-13 | Disposition: A | Discharge status: Home / Self Care | Payer: Medicare Other | Attending: Emergency Medicine | Admitting: Emergency Medicine

## 2018-02-13 DIAGNOSIS — N189 Chronic kidney disease, unspecified: Secondary | ICD-10-CM | POA: Insufficient documentation

## 2018-02-13 DIAGNOSIS — I480 Paroxysmal atrial fibrillation: Secondary | ICD-10-CM | POA: Insufficient documentation

## 2018-02-13 DIAGNOSIS — I130 Hypertensive heart and chronic kidney disease with heart failure and stage 1 through stage 4 chronic kidney disease, or unspecified chronic kidney disease: Secondary | ICD-10-CM | POA: Diagnosis not present

## 2018-02-13 DIAGNOSIS — R0602 Shortness of breath: Secondary | ICD-10-CM | POA: Insufficient documentation

## 2018-02-13 DIAGNOSIS — Z79899 Other long term (current) drug therapy: Secondary | ICD-10-CM | POA: Insufficient documentation

## 2018-02-13 DIAGNOSIS — Z8701 Personal history of pneumonia (recurrent): Secondary | ICD-10-CM | POA: Insufficient documentation

## 2018-02-13 DIAGNOSIS — E1122 Type 2 diabetes mellitus with diabetic chronic kidney disease: Secondary | ICD-10-CM | POA: Diagnosis not present

## 2018-02-13 DIAGNOSIS — Z794 Long term (current) use of insulin: Secondary | ICD-10-CM | POA: Insufficient documentation

## 2018-02-13 DIAGNOSIS — Z7901 Long term (current) use of anticoagulants: Secondary | ICD-10-CM | POA: Diagnosis not present

## 2018-02-13 DIAGNOSIS — I5032 Chronic diastolic (congestive) heart failure: Secondary | ICD-10-CM | POA: Insufficient documentation

## 2018-02-13 DIAGNOSIS — R05 Cough: Secondary | ICD-10-CM | POA: Insufficient documentation

## 2018-02-13 DIAGNOSIS — R059 Cough, unspecified: Secondary | ICD-10-CM

## 2018-02-13 LAB — BASIC METABOLIC PANEL
ANION GAP: 10 (ref 5–15)
BUN: 19 mg/dL (ref 6–20)
CO2: 27 mmol/L (ref 22–32)
Calcium: 8.5 mg/dL — ABNORMAL LOW (ref 8.9–10.3)
Chloride: 100 mmol/L — ABNORMAL LOW (ref 101–111)
Creatinine, Ser: 0.93 mg/dL (ref 0.44–1.00)
GFR, EST NON AFRICAN AMERICAN: 59 mL/min — AB (ref >60)
Glucose, Bld: 231 mg/dL — ABNORMAL HIGH (ref 65–99)
POTASSIUM: 4 mmol/L (ref 3.5–5.1)
SODIUM: 137 mmol/L (ref 135–145)

## 2018-02-13 LAB — CBC
HEMATOCRIT: 42 % (ref 36.0–46.0)
Hemoglobin: 13.3 g/dL (ref 12.0–15.0)
MCH: 26.8 pg (ref 26.0–34.0)
MCHC: 31.7 g/dL (ref 30.0–36.0)
MCV: 84.5 fL (ref 78.0–100.0)
Platelets: 318 10*3/uL (ref 150–400)
RBC: 4.97 MIL/uL (ref 3.87–5.11)
RDW: 17.2 % — AB (ref 11.5–15.5)
WBC: 14.6 10*3/uL — AB (ref 4.0–10.5)

## 2018-02-13 LAB — CBC WITH DIFFERENTIAL/PLATELET
BASOS ABS: 0 10*3/uL (ref 0.0–0.1)
Basophils Relative: 0 %
EOS ABS: 0.4 10*3/uL (ref 0.0–0.7)
EOS PCT: 2 %
HCT: 41.8 % (ref 36.0–46.0)
Hemoglobin: 13.3 g/dL (ref 12.0–15.0)
LYMPHS PCT: 20 %
Lymphs Abs: 2.8 10*3/uL (ref 0.7–4.0)
MCH: 26.8 pg (ref 26.0–34.0)
MCHC: 31.8 g/dL (ref 30.0–36.0)
MCV: 84.3 fL (ref 78.0–100.0)
Monocytes Absolute: 1.2 10*3/uL — ABNORMAL HIGH (ref 0.1–1.0)
Monocytes Relative: 8 %
Neutro Abs: 10 10*3/uL — ABNORMAL HIGH (ref 1.7–7.7)
Neutrophils Relative %: 70 %
PLATELETS: 321 10*3/uL (ref 150–400)
RBC: 4.96 MIL/uL (ref 3.87–5.11)
RDW: 17.2 % — ABNORMAL HIGH (ref 11.5–15.5)
WBC: 14.4 10*3/uL — AB (ref 4.0–10.5)

## 2018-02-13 LAB — HEPATIC FUNCTION PANEL
ALT: 17 U/L (ref 14–54)
AST: 19 U/L (ref 15–41)
Albumin: 3.5 g/dL (ref 3.5–5.0)
Alkaline Phosphatase: 103 U/L (ref 38–126)
BILIRUBIN DIRECT: 0.1 mg/dL (ref 0.1–0.5)
BILIRUBIN INDIRECT: 0.6 mg/dL (ref 0.3–0.9)
Total Bilirubin: 0.7 mg/dL (ref 0.3–1.2)
Total Protein: 7.2 g/dL (ref 6.5–8.1)

## 2018-02-13 LAB — BRAIN NATRIURETIC PEPTIDE: B NATRIURETIC PEPTIDE 5: 78.3 pg/mL (ref 0.0–100.0)

## 2018-02-13 LAB — PROTIME-INR
INR: 2.5
Prothrombin Time: 26.8 seconds — ABNORMAL HIGH (ref 11.4–15.2)

## 2018-02-13 LAB — I-STAT TROPONIN, ED: Troponin i, poc: 0 ng/mL (ref 0.00–0.08)

## 2018-02-13 MED ORDER — AMOXICILLIN-POT CLAVULANATE 875-125 MG PO TABS
1.0000 | ORAL_TABLET | Freq: Once | ORAL | Status: AC
  Administered 2018-02-13: 1 via ORAL
  Filled 2018-02-13: qty 1

## 2018-02-13 MED ORDER — AMOXICILLIN-POT CLAVULANATE 875-125 MG PO TABS: 1.0000 | ORAL_TABLET | Freq: Two times a day (BID) | ORAL | 0 refills | Status: DC

## 2018-02-13 NOTE — ED Provider Notes (Signed)
[facility] DEPT Provider Note   CSN: [id number] Arrival date & time: 02/13/18  1446     History   Chief Complaint Chief Complaint  Patient presents with  . Cough  . Shortness of Breath    HPI [name] is a 77 y.o. female.  Patient complains of cough and some shortness of breath.  She was treated with doxycycline a few weeks ago for pneumonia but states the symptoms are returning  The history is provided by the patient.  Cough  This is a recurrent problem. The problem occurs constantly. The problem has not changed since onset.The cough is non-productive. There has been no fever. Associated symptoms include shortness of breath. Pertinent negatives include no chest pain and no headaches. She has tried nothing for the symptoms. The treatment provided no relief.  Shortness of Breath  Associated symptoms include cough. Pertinent negatives include no headaches, no chest pain, no abdominal pain and no rash.    Past Medical History:  Diagnosis Date  . Arthritis   . Back pain   . Chronic anticoagulation    due to aflutter  . Chronic kidney disease   . Diabetes mellitus   . Diastolic CHF, chronic ([place])    a.  echo 2006 - ef 55-65%; mild diast dysfxn;    b. Echo 08/2011: Mild LVH, EF 60%;  c. 04/2013 Echo: EF 65-69%, mild conc LVH;  08/2014 Echo: EF 60-65%, mild-mod MR.  . Gout   . Hyperlipidemia   . Hypertension    a.  Renal arterial Dopplers 12/2011: 1-59% right renal artery stenosis  . Morbid obesity ([place])   . Obstructive sleep apnea on CPAP   . Paroxysmal Afib/Flutter    a. 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    Patient Active Problem List   Diagnosis Date Noted  . Obesity 12/19/2015  . Hypersomnia 12/19/2015  . Encounter for therapeutic drug monitoring 12/02/2013  . Dyspnea 10/03/2013  . Acute diastolic congestive heart failure, NYHA class 3 ([place]) 10/03/2013  . Hypokalemia 10/03/2013  . Prolonged QT interval 10/03/2013  . Acute  diastolic heart failure ([place]) 10/03/2013  . Elevated transaminase level 08/11/2013  . CAP (community acquired pneumonia) 04/21/2013  . Postural dizziness 04/05/2013  . Chest pain 11/11/2012  . Long term (current) use of anticoagulants 06/09/2012  . Leukocytosis 04/09/2012  . Sinus bradycardia 10/01/2011  . Back pain   . Lumbar strain 08/19/2011  . BENIGN NEOPLASM OF ADRENAL GLAND 11/25/2010  . Chronic diastolic heart failure ([place]) 02/20/2010  . DM 05/16/2009  . GOUT 05/16/2009  . OBESITY, MORBID 05/16/2009  . Essential hypertension 05/16/2009  . ATRIAL FIBRILLATION, PAROXYSMAL 05/16/2009  . SYNCOPE, HX OF 05/16/2009  . ACHILLES TENDINITIS 01/22/2009  . HYPERLIPIDEMIA 11/30/2008  . Obstructive sleep apnea 11/30/2008    Past Surgical History:  Procedure Laterality Date  . APPENDECTOMY    . ATRIAL FLUTTER ABLATION N/A 09/24/2011   Procedure: ATRIAL FLUTTER ABLATION;  Surgeon: [name], MD;  Location: [facility] CATH LAB;  Service: Cardiovascular;  Laterality: N/A;  . CARDIOVERSION  10/22/2011   Procedure: CARDIOVERSION;  Surgeon: [name], MD;  Location: [place];  Service: Cardiovascular;  Laterality: N/A;  . CARDIOVERSION N/A 09/10/2011   Procedure: CARDIOVERSION;  Surgeon: [name], MD;  Location: [facility] CATH LAB;  Service: Cardiovascular;  Laterality: N/A;  . CHOLECYSTECTOMY    . TONSILLECTOMY  1982  . TOTAL ABDOMINAL HYSTERECTOMY       OB History   None  Home Medications    Prior to Admission medications   Medication Sig Start Date End Date Taking? Authorizing Provider  allopurinol (ZYLOPRIM) 300 MG tablet Take 300 mg by mouth daily.  07/07/13  Yes [provider]  amiodarone (PACERONE) 200 MG tablet TAKE 1 TABLET BY MOUTH DAILY MONDAY THROUGH FRIDAY ONLY 01/15/18  Yes [name], MD  cloNIDine (CATAPRES) 0.2 MG tablet Take 0.2 mg by mouth 2 (two) times daily.   Yes [provider]  eplerenone (INSPRA) 25 MG tablet Take 1 tablet (25 mg  total) daily by mouth. 08/26/17  Yes Seiler, Amber K, NP  furosemide (LASIX) 40 MG tablet Take 40 mg by mouth daily.  07/09/15  Yes [name], MD  Insulin Lispro Prot & Lispro (HUMALOG 75/25 MIX) (75-25) 100 UNIT/ML Kwikpen Inject insulin according to sliding scale maximum dose 56. Before breakfast and before dinner 08/12/16  Yes [provider]  ketorolac (ACULAR) 0.5 % ophthalmic solution Place 1 drop 2 (two) times daily into the right eye.   Yes [provider]  levothyroxine (SYNTHROID, LEVOTHROID) 75 MCG tablet Take 75 mcg daily before breakfast by mouth.  12/29/16  Yes [provider]  lisinopril (PRINIVIL,ZESTRIL) 20 MG tablet Take 1 tablet (20 mg total) by mouth daily. 03/19/16  Yes [name], MD  rosuvastatin (CRESTOR) 10 MG tablet Take 10 mg by mouth every evening.   Yes [provider]  sitaGLIPtin (JANUVIA) 100 MG tablet Take 100 mg daily by mouth. 06/24/17  Yes [provider]  tobramycin-dexamethasone (TOBRADEX) ophthalmic ointment Place 1 application 2 (two) times daily into the right eye.   Yes [provider]  warfarin (COUMADIN) 5 MG tablet TAKE 1 TABLET BY MOUTH DAILY AS DIRECTED Patient taking differently: TAKE 1 TABLET BY MOUTH DAILY on monday and tuesday, 1/2 tablet on all other days 12/08/17  Yes [name], MD  amLODipine (NORVASC) 5 MG tablet Take 1 tablet (5 mg total) by mouth daily. Patient not taking: Reported on 02/13/2018 07/14/16   [name], MD  amoxicillin-clavulanate (AUGMENTIN) 875-125 MG tablet Take 1 tablet by mouth 2 (two) times daily. One po bid x 7 days 02/13/18   Milton Ferguson, MD  diltiazem (CARDIZEM) 30 MG tablet Cardizem 30mg  -- take 1 tablet every 4 hours AS NEEDED for heart rate >100 as long as blood pressure >100. Patient not taking: Reported on 02/13/2018 12/25/17   Sherran Needs, NP  guaiFENesin (MUCINEX) 600 MG 12 hr tablet Take 2 tablets (1,200 mg total) by mouth 2 (two) times  daily. Patient not taking: Reported on 02/13/2018 01/20/18   Margarita Mail, PA-C  HYDROcodone-acetaminophen (NORCO/VICODIN) 5-325 MG tablet Take 1 tablet by mouth every 4 (four) hours as needed for moderate pain. Patient not taking: Reported on 01/20/2018 01/11/17   Jola Schmidt, MD  Insulin Pen Needle 31G X 5 MM MISC by Does not apply route as directed.    [provider]    Family History Family History  Problem Relation Age of Onset  . Heart disease Father   . Hypertension Father   . Breast cancer Sister   . Cancer Sister        breast    Social History Social History   Tobacco Use  . Smoking status: Never Smoker  . Smokeless tobacco: Never Used  Substance Use Topics  . Alcohol use: No  . Drug use: No     Allergies   Metformin   Review of Systems  Review of Systems  Constitutional: Negative for appetite change and fatigue.  HENT: Negative for congestion, ear discharge and sinus pressure.   Eyes: Negative for discharge.  Respiratory: Positive for cough and shortness of breath.   Cardiovascular: Negative for chest pain.  Gastrointestinal: Negative for abdominal pain and diarrhea.  Genitourinary: Negative for frequency and hematuria.  Musculoskeletal: Negative for back pain.  Skin: Negative for rash.  Neurological: Negative for seizures and headaches.  Psychiatric/Behavioral: Negative for hallucinations.     Physical Exam Updated Vital Signs BP (!) 124/55 (BP Location: Right Arm)   Pulse 60   Temp 98.2 F (36.8 C) (Oral)   Resp 20   Ht 5\' 1"  (1.549 m)   Wt 122.5 kg (270 lb)   SpO2 100%   BMI 51.02 kg/m   Physical Exam  Constitutional: She is oriented to person, place, and time. She appears well-developed.  HENT:  Head: Normocephalic.  Eyes: Conjunctivae and EOM are normal. No scleral icterus.  Neck: Neck supple. No thyromegaly present.  Cardiovascular: Normal rate and regular rhythm. Exam reveals no gallop and no friction rub.  No murmur  heard. Pulmonary/Chest: No stridor. She has no wheezes. She has no rales. She exhibits no tenderness.  Abdominal: She exhibits no distension. There is no tenderness. There is no rebound.  Musculoskeletal: Normal range of motion. She exhibits no edema.  Lymphadenopathy:    She has no cervical adenopathy.  Neurological: She is oriented to person, place, and time. She exhibits normal muscle tone. Coordination normal.  Skin: No rash noted. No erythema.  Psychiatric: She has a normal mood and affect. Her behavior is normal.     ED Treatments / Results  Labs (all labs ordered are listed, but only abnormal results are displayed) Labs Reviewed  BASIC METABOLIC PANEL - Abnormal; Notable for the following components:      Result Value   Chloride 100 (*)    Glucose, Bld 231 (*)    Calcium 8.5 (*)    GFR calc non Af Amer 59 (*)    All other components within normal limits  CBC - Abnormal; Notable for the following components:   WBC 14.6 (*)    RDW 17.2 (*)    All other components within normal limits  PROTIME-INR - Abnormal; Notable for the following components:   Prothrombin Time 26.8 (*)    All other components within normal limits  CBC WITH DIFFERENTIAL/PLATELET - Abnormal; Notable for the following components:   WBC 14.4 (*)    RDW 17.2 (*)    Neutro Abs 10.0 (*)    Monocytes Absolute 1.2 (*)    All other components within normal limits  BRAIN NATRIURETIC PEPTIDE  HEPATIC FUNCTION PANEL  I-STAT TROPONIN, ED    EKG EKG Interpretation  Date/Time:  Saturday February 13 2018 15:27:03 EDT Ventricular Rate:  65 PR Interval:    QRS Duration: 86 QT Interval:  458 QTC Calculation: 477 R Axis:   -7 Text Interpretation:  Sinus rhythm Low voltage, precordial leads Abnormal R-wave progression, early transition Baseline wander in lead(s) II aVR V2 Confirmed by Milton Ferguson [phone]) on 02/13/2018 4:00:16 PM Also confirmed by Milton Ferguson [phone])  on 02/13/2018 6:54:47 PM   Radiology Dg  Chest 2 View  Result Date: 02/13/2018 CLINICAL DATA:  Dry cough. EXAM: CHEST - 2 VIEW COMPARISON:  January 20, 2018 FINDINGS: Mild cardiomegaly. The hila and mediastinum are normal. Mild focal infiltrate in the right base. Chronic opacity in the right middle lobe is  likely atelectasis. IMPRESSION: Suspicion for subtle acute infiltrate in the right base. Recommend follow-up to resolution. Electronically Signed   By: Dorise Bullion III M.D   On: 02/13/2018 17:19    Procedures Procedures (including critical care time)  Medications Ordered in ED Medications  amoxicillin-clavulanate (AUGMENTIN) 875-125 MG per tablet 1 tablet (has no administration in time range)     Initial Impression / Assessment and Plan / ED Course  I have reviewed the triage vital signs and the nursing notes.  Pertinent labs & imaging results that were available during my care of the patient were reviewed by me and considered in my medical decision making (see chart for details).     Lab work patient has an elevated white count and x-ray shows possible small infiltrate in the right base.  Patient is nontoxic she will be placed on Augmentin we will follow-up with her PCP Final Clinical Impressions(s) / ED Diagnoses   Final diagnoses:  Cough    ED Discharge Orders        Ordered    amoxicillin-clavulanate (AUGMENTIN) 875-125 MG tablet  2 times daily     02/13/18 1900       Milton Ferguson, MD 02/13/18 1902

## 2018-02-13 NOTE — Telephone Encounter (Signed)
Received page to answering service from "[place]" but the phone number provided was the patient. She apparently was speaking to [facility] and noting increased dry cough, fluctuating weight and worsening swelling in her legs today. As I was talking to her she said she was already planning to go to urgent care to be evaluated. Listening to her she sounds audibly SOB on the phone - she reports this is just because it's hot outside and she had to walk to her car. BMI 51, recently diagnosed PNA, h/o chronic diastolic CHF. I'm not sure with her symptoms that urgent care is the best place for her as her symptoms may reflect simultaneous persistent PNA as well as a possible component of CHF. She is also on amio. We discussed diverting to ER instead. She declined to call 911 or have someone else drive her; risks reviewed. The patient verbalized understanding and gratitude for the call. Harlow Basley PA-C

## 2018-02-13 NOTE — ED Triage Notes (Addendum)
Patient c/o cough and SOB and chest rightness x 3 days. Patient states SOB increases when she walks. Patient states she also has a dry cough.

## 2018-02-13 NOTE — Discharge Instructions (Addendum)
Follow-up with your family doctor in the next couple weeks.

## 2018-02-19 ENCOUNTER — Ambulatory Visit (INDEPENDENT_AMBULATORY_CARE_PROVIDER_SITE_OTHER): Payer: Medicare Other | Admitting: *Deleted

## 2018-02-19 DIAGNOSIS — I4891 Unspecified atrial fibrillation: Secondary | ICD-10-CM

## 2018-02-19 DIAGNOSIS — Z5181 Encounter for therapeutic drug level monitoring: Secondary | ICD-10-CM

## 2018-02-19 LAB — POCT INR: INR: 2.1

## 2018-02-19 NOTE — Patient Instructions (Signed)
Description   Continue on same dosage 1 tablet everyday except 1/2 tablet on Wednesdays and Fridays.  Recheck INR in 4 weeks. Call with any new medications of if scheduled for any procedures [phone]     

## 2018-02-22 DIAGNOSIS — J984 Other disorders of lung: Secondary | ICD-10-CM | POA: Insufficient documentation

## 2018-02-23 ENCOUNTER — Other Ambulatory Visit: Payer: Self-pay | Admitting: Internal Medicine

## 2018-03-19 ENCOUNTER — Telehealth: Payer: Self-pay

## 2018-03-19 ENCOUNTER — Ambulatory Visit (INDEPENDENT_AMBULATORY_CARE_PROVIDER_SITE_OTHER): Payer: Medicare Other | Admitting: *Deleted

## 2018-03-19 DIAGNOSIS — I4891 Unspecified atrial fibrillation: Secondary | ICD-10-CM | POA: Diagnosis not present

## 2018-03-19 DIAGNOSIS — Z5181 Encounter for therapeutic drug level monitoring: Secondary | ICD-10-CM | POA: Diagnosis not present

## 2018-03-19 LAB — POCT INR: INR: 2.2 (ref 2.0–3.0)

## 2018-03-19 NOTE — Telephone Encounter (Signed)
Pt came in today for an INR check with the coumadin clinic. She had some c/o intermittent Afib today but had not taken her PRN diltiazem as of yet. She denied any symptoms. I palpated pt's radial pulse. It was irregular but strong and not rapid.  I called pt a few hours after she got home. She had not taken her diltiazem as of that time, she was concerned with her BP being too low. It was 147/76. I advised her if her HR still felt irregular, it was safe for her to take her diltiazem. I stated I would call her on Monday to check up on her and see how the weekend went. In the meantime, I would like to have her make note of the frequency of her AF. If it was sustained with symptoms after she had taken her diltiazem, she should visit the ER.

## 2018-03-19 NOTE — Patient Instructions (Signed)
Description   Continue on same dosage 1 tablet everyday except 1/2 tablet on Wednesdays and Fridays.  Recheck INR in 4 weeks. Call with any new medications of if scheduled for any procedures [phone]     

## 2018-03-22 NOTE — Telephone Encounter (Signed)
Called pt today for follow up. She states she has had increased HR's throughout the weekend. She is usually unsure as to when she should take her medication; would like to see Roderic Palau or Dr Caryl Comes this week. An appt was made for 6/4 at 2:15 with Dr Caryl Comes for further recommendation and symptom management.

## 2018-03-23 ENCOUNTER — Encounter: Payer: Self-pay | Admitting: Internal Medicine

## 2018-03-23 ENCOUNTER — Ambulatory Visit (INDEPENDENT_AMBULATORY_CARE_PROVIDER_SITE_OTHER): Payer: Medicare Other | Admitting: Internal Medicine

## 2018-03-23 VITALS — BP 112/70 | HR 70 | Ht 61.0 in | Wt 272.4 lb

## 2018-03-23 DIAGNOSIS — I4891 Unspecified atrial fibrillation: Secondary | ICD-10-CM

## 2018-03-23 MED ORDER — DILTIAZEM HCL ER COATED BEADS 120 MG PO CP24: 120.0000 mg | ORAL_CAPSULE | Freq: Every day | ORAL | 3 refills | Status: DC

## 2018-03-23 NOTE — Patient Instructions (Signed)
Medication Instructions:  Your physician has recommended you make the following change in your medication:   1. Stop amlodipine (Norvasc) 2. Begin Diltiazem (Cardizem ER) 120mg , one tablet, every day 3. Increase Amiodarone-- 200mg  tablet, once in the morning, once in the evening from now until June 18.  Labwork: You will have labs drawn today: TSH  Testing/Procedures: None ordered.  Follow-Up: Your physician wants you to follow-up in: 3 months with Chanetta Marshall, NP.   Any Other Special Instructions Will Be Listed Below (If Applicable).     If you need a refill on your cardiac medications before your next appointment, please call your pharmacy.

## 2018-03-23 NOTE — Progress Notes (Addendum)
Patient Care Team: Kristopher Glee., MD as PCP - General (Internal Medicine) Shawnee Knapp, MD as Resident (Family Medicine)   HPI  [name] is a 77 y.o. female seen in followup for atrial arrhythmias associated with diastolic heart failure. She's been tried on flecainide, dofetilide, and most recently has been on amiodarone.    Seen a few weeks ago and noted by LS RN to have irregular pulse which she felt was most consistent with atrial fibrillation.  She comes in today in sinus rhythm.  She struggles with shortness of breath.  What weight she has lost, she has regained.  She is also been having problems with edema in the context of HFpEF.  Outpatient diuretics have been adjusted  She has been seen in the emergency room for pneumonia ? demonstrated some scarring    DATE TEST EF   11/12 Echo   60-65 %   11/15 Echo   60-65 % MR mild-mod  8/17 Echo  60-65% LAE (47/2.1/38)   Date Cr K TSH LFTs PFTs  8/17`   2.444    3/18   0.175    4/19  0.93 4.0   17 106%                   Past Medical History:  Diagnosis Date  . Arthritis   . Back pain   . Chronic anticoagulation    due to aflutter  . Chronic kidney disease   . Diabetes mellitus   . Diastolic CHF, chronic ([place])    a.  echo 2006 - ef 55-65%; mild diast dysfxn;    b. Echo 08/2011: Mild LVH, EF 60%;  c. 04/2013 Echo: EF 65-69%, mild conc LVH;  08/2014 Echo: EF 60-65%, mild-mod MR.  . Gout   . Hyperlipidemia   . Hypertension    a.  Renal arterial Dopplers 12/2011: 1-59% right renal artery stenosis  . Morbid obesity ([place])   . Obstructive sleep apnea on CPAP   . Paroxysmal Afib/Flutter    a. 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 APPENDECTOMY    . ATRIAL FLUTTER ABLATION N/A 09/24/2011   Procedure: ATRIAL FLUTTER ABLATION;  Surgeon: [name], MD;  Location: [facility]ATH LAB;  Service: Cardiovascular;  Laterality: N/A;  . CARDIOVERSION  10/22/2011     Procedure: CARDIOVERSION;  Surgeon: [name], MD;  Location: [place];  Service: Cardiovascular;  Laterality: N/A;  . CARDIOVERSION N/A 09/10/2011   Procedure: CARDIOVERSION;  Surgeon: [name], MD;  Location: [facility] CATH LAB;  Service: Cardiovascular;  Laterality: N/A;  . CHOLECYSTECTOMY    . TONSILLECTOMY  1982  . TOTAL ABDOMINAL HYSTERECTOMY      Current Outpatient Medications  Medication Sig Dispense Refill  . allopurinol (ZYLOPRIM) 300 MG tablet Take 300 mg by mouth daily.     Marland Kitchen amiodarone (PACERONE) 200 MG tablet TAKE 1 TABLET BY MOUTH DAILY MONDAY THROUGH FRIDAY ONLY 90 tablet 2  . amoxicillin-clavulanate (AUGMENTIN) 875-125 MG tablet Take 1 tablet by mouth 2 (two) times daily. One po bid x 7 days 14 tablet 0  . cloNIDine (CATAPRES) 0.2 MG tablet Take 0.2 mg by mouth 2 (two) times daily.    Marland Kitchen eplerenone (INSPRA) 25 MG tablet Take 1 tablet (25 mg total) daily by mouth. 30 tablet 11  . furosemide (LASIX) 40 MG tablet Take 40 mg by mouth daily.     Marland Kitchen  guaiFENesin (MUCINEX) 600 MG 12 hr tablet Take 2 tablets (1,200 mg total) by mouth 2 (two) times daily. 20 tablet 0  . HYDROcodone-acetaminophen (NORCO/VICODIN) 5-325 MG tablet Take 1 tablet by mouth every 4 (four) hours as needed for moderate pain. 10 tablet 0  . Insulin Lispro Prot & Lispro (HUMALOG 75/25 MIX) (75-25) 100 UNIT/ML Kwikpen Inject insulin according to sliding scale maximum dose 56. Before breakfast and before dinner    . Insulin Pen Needle 31G X 5 MM MISC by Does not apply route as directed.    Marland Kitchen ketorolac (ACULAR) 0.5 % ophthalmic solution Place 1 drop 2 (two) times daily into the right eye.    . levothyroxine (SYNTHROID, LEVOTHROID) 75 MCG tablet Take 75 mcg daily before breakfast by mouth.     Marland Kitchen lisinopril (PRINIVIL,ZESTRIL) 20 MG tablet Take 1 tablet (20 mg total) by mouth daily. 30 tablet 11  . rosuvastatin (CRESTOR) 10 MG tablet Take 10 mg by mouth every evening.    . sitaGLIPtin (JANUVIA) 100 MG tablet Take 100  mg daily by mouth.    . tobramycin-dexamethasone (TOBRADEX) ophthalmic ointment Place 1 application 2 (two) times daily into the right eye.    . warfarin (COUMADIN) 5 MG tablet TAKE 1 TABLET BY MOUTH DAILY AS DIRECTED 30 tablet 3  . diltiazem (CARDIZEM CD) 120 MG 24 hr capsule Take 1 capsule (120 mg total) by mouth daily. 90 capsule 3   No current facility-administered medications for this visit.     Allergies  Allergen Reactions  . Metformin Diarrhea    Review of Systems negative except from HPI and PMH  Physical Exam BP 112/70   Pulse 70   Ht 5\' 1"  (1.549 m)   Wt 272 lb 6.4 oz (123.6 kg)   SpO2 97%   BMI 51.47 kg/m  Well developed and Morbidly obese   in no acute distress HENT normal Neck supple with JVP-flat Clear Regular rate and rhythm, no murmurs or gallops Abd-soft with active BS No Clubbing cyanosis edema Skin-warm and dry A & Oriented  Grossly normal sensory and motor function    ECG demonstrates SINUS @ 70  Assessment and  Plan  Hypertension   Persistent atrial fibrillation   HFpEF   High Risk Medication Surveillance  Morbidly obese       With rapid recurrent atrial fibrillation, we will increase amiodarone to the short-term to 200 mg twice daily x2 weeks.  Then resume 200 mg a day.  We will also change her amlodipine--diltiazem to help augment rate control.  We might consider adjunctive ranolazine  Had a long discussion with her and her son regarding weight loss.  Suggested she consider weight loss reduction surgery.  She is not a candidate for catheter ablation given her size.  Hopefully if weight loss were successful, this might afford further options.  We also need to check her surveillance laboratories on amiodarone specifically her TSH  More than 50% of 40 min was spent in counseling related to the above

## 2018-03-24 LAB — TSH: TSH: 1.72 u[IU]/mL (ref 0.450–4.500)

## 2018-03-31 ENCOUNTER — Ambulatory Visit: Payer: Medicare Other | Admitting: Internal Medicine

## 2018-04-01 ENCOUNTER — Telehealth: Payer: Self-pay | Admitting: Internal Medicine

## 2018-04-01 NOTE — Telephone Encounter (Signed)
New Message:       Pt c/o medication issue:  1. Name of Medication: diltiazem (CARDIZEM CD) 120 MG 24 hr capsule  cloNIDine (CATAPRES) 0.2 MG tablet  2. How are you currently taking this medication (dosage and times per day)?Take 1 capsule (120 mg total) by mouth daily.  Take 0.2 mg by mouth 2 (two) times daily.   3. Are you having a reaction (difficulty breathing--STAT)? No  4. What is your medication issue? [facility] is calling and wanting to see if we are aware that this pt is on both of these medications.

## 2018-04-01 NOTE — Telephone Encounter (Signed)
Pt's pharmacy notified that Dr Caryl Comes is aware pt is on clonidine and diltiazem.

## 2018-04-08 ENCOUNTER — Telehealth: Payer: Self-pay | Admitting: *Deleted

## 2018-04-08 NOTE — Telephone Encounter (Signed)
Pt calls stating she had been on Nitrofuratoin  100 mg bid for UTI and she states she finished this medication  on Saturday Instructed pt there is no interaction between Nitrofuratoin and Coumadin and to call us if she is placed on any other medications and she states understanding

## 2018-04-19 ENCOUNTER — Ambulatory Visit (INDEPENDENT_AMBULATORY_CARE_PROVIDER_SITE_OTHER): Payer: Medicare Other | Admitting: *Deleted

## 2018-04-19 DIAGNOSIS — I4891 Unspecified atrial fibrillation: Secondary | ICD-10-CM | POA: Diagnosis not present

## 2018-04-19 DIAGNOSIS — Z5181 Encounter for therapeutic drug level monitoring: Secondary | ICD-10-CM

## 2018-04-19 LAB — POCT INR: INR: 2.5 (ref 2.0–3.0)

## 2018-04-19 NOTE — Patient Instructions (Signed)
Description   Continue on same dosage 1 tablet everyday except 1/2 tablet on Wednesdays and Fridays.  Recheck INR in 4 weeks. Call with any new medications of if scheduled for any procedures [phone]

## 2018-05-03 ENCOUNTER — Telehealth: Payer: Self-pay | Admitting: *Deleted

## 2018-05-03 NOTE — Telephone Encounter (Signed)
Pt takes warfarin for afib with CHADS2VASc score of 5 (age, sex, HTN, CHF, DM). Recommend continuing warfarin for dental cleaning and fillings.

## 2018-05-03 NOTE — Telephone Encounter (Signed)
I have routed to the dentist the pharmacist's recommendation.  Burtis Junes, RN, [facility] [address] [place], [place]  [address] [phone]

## 2018-05-03 NOTE — Telephone Encounter (Signed)
   [facility] Pre-operative Risk Assessment    Request for surgical clearance:  1. What type of surgery is being performed? Teeth cleaning and 2 fillings   2. When is this surgery scheduled? TBD   3. What type of clearance is required (medical clearance vs. Pharmacy clearance to hold med vs. Both)? Need ok to have 2 fillings and teethed cleaned off Coumadin  4. Are there any medications that need to be held prior to surgery and how long?Coumadin   5. Practice name and name of physician performing surgery? [facility], Dr. Carloyn Jaeger [place]   6. What is your office phone [phone]    7.   What is your office fax number [phone]  8.   Anesthesia type (None, local, MAC, general) ? Local   Janan Halter 05/03/2018, 12:58 PM  _________________________________________________________________   (provider comments below)

## 2018-05-14 ENCOUNTER — Telehealth: Payer: Self-pay | Admitting: Internal Medicine

## 2018-05-14 NOTE — Telephone Encounter (Signed)
Pt called and needs a call back pt stated she forgot which medication she should stop taking.

## 2018-05-14 NOTE — Telephone Encounter (Signed)
Pt calls today to verify her medications. She wanted to be sure she was supposed to be taking 120mg  Diltiazem CD vs Diltiazem 30mg . We verified together she was in fact taking the right medication, Dilt CD 120mg . She had no additional questions.

## 2018-05-17 ENCOUNTER — Ambulatory Visit (INDEPENDENT_AMBULATORY_CARE_PROVIDER_SITE_OTHER): Payer: Medicare Other | Admitting: *Deleted

## 2018-05-17 DIAGNOSIS — Z5181 Encounter for therapeutic drug level monitoring: Secondary | ICD-10-CM

## 2018-05-17 DIAGNOSIS — I4891 Unspecified atrial fibrillation: Secondary | ICD-10-CM

## 2018-05-17 LAB — POCT INR: INR: 2.1 (ref 2.0–3.0)

## 2018-05-17 NOTE — Patient Instructions (Signed)
Description   Continue on same dosage 1 tablet everyday except 1/2 tablet on Wednesdays and Fridays.  Recheck INR in 6 weeks with MD appt Call with any new medications of if scheduled for any procedures [phone]

## 2018-06-08 ENCOUNTER — Ambulatory Visit (HOSPITAL_COMMUNITY)
Admission: RE | Admit: 2018-06-08 | Discharge: 2018-06-08 | Disposition: A | Discharge status: Home / Self Care | Payer: Medicare Other | Source: Ambulatory Visit | Attending: Nurse Practitioner | Admitting: Nurse Practitioner

## 2018-06-08 ENCOUNTER — Telehealth: Payer: Self-pay | Admitting: Internal Medicine

## 2018-06-08 ENCOUNTER — Encounter (HOSPITAL_COMMUNITY): Payer: Self-pay | Admitting: Nurse Practitioner

## 2018-06-08 VITALS — BP 120/72 | HR 116 | Ht 61.0 in | Wt 271.0 lb

## 2018-06-08 DIAGNOSIS — Z79899 Other long term (current) drug therapy: Secondary | ICD-10-CM | POA: Insufficient documentation

## 2018-06-08 DIAGNOSIS — M109 Gout, unspecified: Secondary | ICD-10-CM | POA: Insufficient documentation

## 2018-06-08 DIAGNOSIS — E785 Hyperlipidemia, unspecified: Secondary | ICD-10-CM | POA: Diagnosis not present

## 2018-06-08 DIAGNOSIS — E1122 Type 2 diabetes mellitus with diabetic chronic kidney disease: Secondary | ICD-10-CM | POA: Insufficient documentation

## 2018-06-08 DIAGNOSIS — I4892 Unspecified atrial flutter: Secondary | ICD-10-CM | POA: Insufficient documentation

## 2018-06-08 DIAGNOSIS — Z7901 Long term (current) use of anticoagulants: Secondary | ICD-10-CM | POA: Insufficient documentation

## 2018-06-08 DIAGNOSIS — G4733 Obstructive sleep apnea (adult) (pediatric): Secondary | ICD-10-CM | POA: Insufficient documentation

## 2018-06-08 DIAGNOSIS — I129 Hypertensive chronic kidney disease with stage 1 through stage 4 chronic kidney disease, or unspecified chronic kidney disease: Secondary | ICD-10-CM | POA: Insufficient documentation

## 2018-06-08 DIAGNOSIS — Z794 Long term (current) use of insulin: Secondary | ICD-10-CM | POA: Diagnosis not present

## 2018-06-08 DIAGNOSIS — I480 Paroxysmal atrial fibrillation: Secondary | ICD-10-CM | POA: Insufficient documentation

## 2018-06-08 DIAGNOSIS — N189 Chronic kidney disease, unspecified: Secondary | ICD-10-CM | POA: Diagnosis not present

## 2018-06-08 DIAGNOSIS — I4819 Other persistent atrial fibrillation: Secondary | ICD-10-CM

## 2018-06-08 DIAGNOSIS — I481 Persistent atrial fibrillation: Secondary | ICD-10-CM

## 2018-06-08 MED ORDER — DILTIAZEM HCL ER COATED BEADS 120 MG PO CP24: 120.0000 mg | ORAL_CAPSULE | Freq: Two times a day (BID) | ORAL | 0 refills | Status: DC

## 2018-06-08 NOTE — Progress Notes (Signed)
Primary Care Physician: Kristopher Glee., MD Referring Physician:[facility] triage   [name] is a 77 y.o. female with a h/o paroxysmal afib previously on flecainide, dofetilide and most recently on amiodarone. She was seen in June by Dr. Caryl Comes, who changed her amlodipine to diltiazem and temporarily reloaded her amiodarone for some recurrent afib but on that day she was in SR. She is now in the afib clinic and reports that she has been in persistent afib since Saturday. She went to urgent care on the 19th  but they did not really change anything and she left in afib. He then felt some chest pressure today coming out of the bathroom and called 911. They did not transport her to the  ER as she told them she had this appointment. Ekg shows afib around 108 bpm. She no longer has the chest discomfort.  Today, she denies symptoms of palpitations, chest pain, shortness of breath, orthopnea, PND, lower extremity edema, dizziness, presyncope, syncope, or neurologic sequela. The patient is tolerating medications without difficulties and is otherwise without complaint today.   Past Medical History:  Diagnosis Date  . Arthritis   . Back pain   . Chronic anticoagulation    due to aflutter  . Chronic kidney disease   . Diabetes mellitus   . Diastolic CHF, chronic ([place])    a.  echo 2006 - ef 55-65%; mild diast dysfxn;    b. Echo 08/2011: Mild LVH, EF 60%;  c. 04/2013 Echo: EF 65-69%, mild conc LVH;  08/2014 Echo: EF 60-65%, mild-mod MR.  . Gout   . Hyperlipidemia   . Hypertension    a.  Renal arterial Dopplers 12/2011: 1-59% right renal artery stenosis  . Morbid obesity ([place])   . Obstructive sleep apnea on CPAP   . Paroxysmal Afib/Flutter    a. 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   Past Surgical History:  Procedure Laterality Date  . APPENDECTOMY    . ATRIAL FLUTTER ABLATION N/A 09/24/2011   Procedure: ATRIAL FLUTTER ABLATION;  Surgeon: [name], MD;  Location: [facility] CATH LAB;  Service:  Cardiovascular;  Laterality: N/A;  . CARDIOVERSION  10/22/2011   Procedure: CARDIOVERSION;  Surgeon: [name], MD;  Location: [place];  Service: Cardiovascular;  Laterality: N/A;  . CARDIOVERSION N/A 09/10/2011   Procedure: CARDIOVERSION;  Surgeon: [name], MD;  Location: [facility] CATH LAB;  Service: Cardiovascular;  Laterality: N/A;  . CHOLECYSTECTOMY    . TONSILLECTOMY  1982  . TOTAL ABDOMINAL HYSTERECTOMY      Current Outpatient Medications  Medication Sig Dispense Refill  . allopurinol (ZYLOPRIM) 300 MG tablet Take 300 mg by mouth daily.     Marland Kitchen amiodarone (PACERONE) 200 MG tablet TAKE 1 TABLET BY MOUTH DAILY MONDAY THROUGH FRIDAY ONLY 90 tablet 2  . amoxicillin-clavulanate (AUGMENTIN) 875-125 MG tablet Take 1 tablet by mouth 2 (two) times daily. One po bid x 7 days 14 tablet 0  . cloNIDine (CATAPRES) 0.2 MG tablet Take 0.2 mg by mouth 2 (two) times daily.    Marland Kitchen diltiazem (CARDIZEM CD) 120 MG 24 hr capsule Take 1 capsule (120 mg total) by mouth 2 (two) times daily. 180 capsule 0  . eplerenone (INSPRA) 25 MG tablet Take 1 tablet (25 mg total) daily by mouth. 30 tablet 11  . furosemide (LASIX) 40 MG tablet Take 40 mg by mouth daily.     . Insulin Lispro Prot & Lispro (HUMALOG 75/25 MIX) (75-25) 100 UNIT/ML Kwikpen Inject insulin according  to sliding scale maximum dose 56. Before breakfast and before dinner    . Insulin Pen Needle 31G X 5 MM MISC by Does not apply route as directed.    Marland Kitchen ketorolac (ACULAR) 0.5 % ophthalmic solution Place 1 drop 2 (two) times daily into the right eye.    . levothyroxine (SYNTHROID, LEVOTHROID) 75 MCG tablet Take 75 mcg daily before breakfast by mouth.     Marland Kitchen lisinopril (PRINIVIL,ZESTRIL) 20 MG tablet Take 1 tablet (20 mg total) by mouth daily. 30 tablet 11  . rosuvastatin (CRESTOR) 10 MG tablet Take 10 mg by mouth every evening.    . sitaGLIPtin (JANUVIA) 100 MG tablet Take 100 mg daily by mouth.    . tobramycin-dexamethasone (TOBRADEX) ophthalmic ointment  Place 1 application 2 (two) times daily into the right eye.    . warfarin (COUMADIN) 5 MG tablet TAKE 1 TABLET BY MOUTH DAILY AS DIRECTED 30 tablet 3  . guaiFENesin (MUCINEX) 600 MG 12 hr tablet Take 2 tablets (1,200 mg total) by mouth 2 (two) times daily. (Patient not taking: Reported on 06/08/2018) 20 tablet 0  . HYDROcodone-acetaminophen (NORCO/VICODIN) 5-325 MG tablet Take 1 tablet by mouth every 4 (four) hours as needed for moderate pain. (Patient not taking: Reported on 06/08/2018) 10 tablet 0   No current facility-administered medications for this encounter.     Allergies  Allergen Reactions  . Metformin Diarrhea    Social History   Socioeconomic History  . Marital status: Married    Spouse name: Not on file  . Number of children: Y  . Years of education: Not on file  . Highest education level: Not on file  Occupational History  . Occupation: DISABILITY/housewife    Employer: RETIRED  Social Needs  . Financial resource strain: Not on file  . Food insecurity:    Worry: Not on file    Inability: Not on file  . Transportation needs:    Medical: Not on file    Non-medical: Not on file  Tobacco Use  . Smoking status: Never Smoker  . Smokeless tobacco: Never Used  Substance and Sexual Activity  . Alcohol use: No  . Drug use: No  . Sexual activity: Yes  Lifestyle  . Physical activity:    Days per week: Not on file    Minutes per session: Not on file  . Stress: Not on file  Relationships  . Social connections:    Talks on phone: Not on file    Gets together: Not on file    Attends religious service: Not on file    Active member of club or organization: Not on file    Attends meetings of clubs or organizations: Not on file    Relationship status: Not on file  . Intimate partner violence:    Fear of current or ex partner: Not on file    Emotionally abused: Not on file    Physically abused: Not on file    Forced sexual activity: Not on file  Other Topics Concern  .  Not on file  Social History Narrative  . Not on file    Family History  Problem Relation Age of Onset  . Heart disease Father   . Hypertension Father   . Breast cancer Sister   . Cancer Sister        breast    ROS- All systems are reviewed and negative except as per the HPI above  Physical Exam: Vitals:   06/08/18 1444  BP:  120/72  Pulse: (!) 116  Weight: 122.9 kg  Height: 5\' 1"  (1.549 m)   Wt Readings from Last 3 Encounters:  06/08/18 122.9 kg  03/23/18 123.6 kg  02/13/18 122.5 kg    Labs: Lab Results  Component Value Date   NA 137 02/13/2018   K 4.0 02/13/2018   CL 100 (L) 02/13/2018   CO2 27 02/13/2018   GLUCOSE 231 (H) 02/13/2018   BUN 19 02/13/2018   CREATININE 0.93 02/13/2018   CALCIUM 8.5 (L) 02/13/2018   MG 1.9 08/03/2015   Lab Results  Component Value Date   INR 2.1 05/17/2018   Lab Results  Component Value Date   CHOL 207 (H) 04/06/2013   HDL 58 04/06/2013   LDLCALC 112 (H) 04/06/2013   TRIG 186 (H) 04/06/2013     GEN- The patient is well appearing, alert and oriented x 3 today.   Head- normocephalic, atraumatic Eyes-  Sclera clear, conjunctiva pink Ears- hearing intact Oropharynx- clear Neck- supple, no JVP Lymph- no cervical lymphadenopathy Lungs- Clear to ausculation bilaterally, normal work of breathing Heart- irregular rate and rhythm, no murmurs, rubs or gallops, PMI not laterally displaced GI- soft, NT, ND, + BS Extremities- no clubbing, cyanosis, or edema MS- no significant deformity or atrophy Skin- no rash or lesion Psych- euthymic mood, full affect Neuro- strength and sensation are intact  EKG-afib at 109 bpm, qrs int 76 ms, qtc 476 ms Epic records reviewed Echo-Study Conclusions  - Left ventricle: The cavity size was normal. Systolic function was   normal. The estimated ejection fraction was in the range of 60%   to 65%. Wall motion was normal; there were no regional wall   motion abnormalities. Features are  consistent with a pseudonormal   left ventricular filling pattern, with concomitant abnormal   relaxation and increased filling pressure (grade 2 diastolic   dysfunction). Doppler parameters are consistent with high   ventricular filling pressure. - Aortic valve: Transvalvular velocity was within the normal range.   There was no stenosis. There was no regurgitation. - Mitral valve: Transvalvular velocity was within the normal range.   There was no evidence for stenosis. There was mild regurgitation.   Valve area by pressure half-time: 1.79 cm^2. Valve area by   continuity equation (using LVOT flow): 1.1 cm^2. - Left atrium: The atrium was severely dilated. - Right ventricle: The cavity size was normal. Wall thickness was   normal. Systolic function was normal. - Tricuspid valve: There was trivial regurgitation. - Pulmonary arteries: Systolic pressure was within the normal   range. PA peak pressure: 25 mm Hg (S).    Assessment and Plan: 1. persistent afib Continue amiodarone at 200 mg qd Will increase cardizem to 120 mg bid Discussed she amy need possible cardioversion  2. Chadsvasc score of at least 5 Continue warfarin   Will see back on Friday, may consider increasing amiodarone at that point   Blair. Artha Chiasson, [facility] [address] [place], [place] [address] [phone]

## 2018-06-08 NOTE — Telephone Encounter (Signed)
Spoke with patient informing me that her HR has been elevated the past couple of days between 120-145 and is in Afib.  Her BP is fine 130/70.  She has taken  extra doses of Diltiazem per Urgent Care, but it has not touched her HR.  I have scheduled her for an Afib clinic appt for 8/20 @ 2:30pm.  She verbally understood and will make the visit.

## 2018-06-08 NOTE — Telephone Encounter (Signed)
New Message    Patient c/o Palpitations:  High priority if patient c/o lightheadedness, shortness of breath, or chest pain  1) How long have you had palpitations/irregular HR/ Afib? Are you having the symptoms now? Since Friday. Yes  2) Are you currently experiencing lightheadedness, SOB or CP? SOB   3) Do you have a history of afib (atrial fibrillation) or irregular heart rhythm? Yes  4) Have you checked your BP or HR? (document readings if available): BP was 147/89 HR 135  5) Are you experiencing any other symptoms? no

## 2018-06-11 ENCOUNTER — Encounter (HOSPITAL_COMMUNITY): Payer: Self-pay | Admitting: Nurse Practitioner

## 2018-06-11 ENCOUNTER — Ambulatory Visit (HOSPITAL_COMMUNITY)
Admission: RE | Admit: 2018-06-11 | Discharge: 2018-06-11 | Disposition: A | Discharge status: Home / Self Care | Payer: Medicare Other | Source: Ambulatory Visit | Attending: Nurse Practitioner | Admitting: Nurse Practitioner

## 2018-06-11 VITALS — BP 124/68 | HR 57 | Ht 61.0 in | Wt 270.2 lb

## 2018-06-11 DIAGNOSIS — I480 Paroxysmal atrial fibrillation: Secondary | ICD-10-CM

## 2018-06-11 DIAGNOSIS — Z7901 Long term (current) use of anticoagulants: Secondary | ICD-10-CM | POA: Insufficient documentation

## 2018-06-11 DIAGNOSIS — Z794 Long term (current) use of insulin: Secondary | ICD-10-CM | POA: Diagnosis not present

## 2018-06-11 DIAGNOSIS — Z79899 Other long term (current) drug therapy: Secondary | ICD-10-CM | POA: Insufficient documentation

## 2018-06-11 DIAGNOSIS — I481 Persistent atrial fibrillation: Secondary | ICD-10-CM | POA: Diagnosis not present

## 2018-06-11 NOTE — Progress Notes (Signed)
Primary Care Physician: Kristopher Glee., MD Referring Physician:[facility] triage   [name] is a 77 y.o. female with a h/o paroxysmal afib previously on flecainide, dofetilide and most recently on amiodarone. She was seen in June by Dr. Caryl Comes, who changed her amlodipine to diltiazem and temporarily reloaded her amiodarone for some recurrent afib but on that day she was in SR. She is now in the afib clinic and reports that she has been in persistent afib since Saturday. She went to urgent care on the 19th  but they did not really change anything and she left in afib. He then felt some chest pressure today coming out of the bathroom and called 911. They did not transport her to the  ER as she told them she had this appointment. Ekg shows afib around 108 bpm. She no longer has the chest discomfort.  F/u in afib clinic 8/23. On last visit I doubled dose of Cardizem for afib and she returns today in [place]. She took the extra Cardizem for 3 days and after she converted, she reduced this back to one time a day. She feels much improved.  Today, she denies symptoms of palpitations, chest pain, shortness of breath, orthopnea, PND, lower extremity edema, dizziness, presyncope, syncope, or neurologic sequela. The patient is tolerating medications without difficulties and is otherwise without complaint today.   Past Medical History:  Diagnosis Date  . Arthritis   . Back pain   . Chronic anticoagulation    due to aflutter  . Chronic kidney disease   . Diabetes mellitus   . Diastolic CHF, chronic ([place])    a.  echo 2006 - ef 55-65%; mild diast dysfxn;    b. Echo 08/2011: Mild LVH, EF 60%;  c. 04/2013 Echo: EF 65-69%, mild conc LVH;  08/2014 Echo: EF 60-65%, mild-mod MR.  . Gout   . Hyperlipidemia   . Hypertension    a.  Renal arterial Dopplers 12/2011: 1-59% right renal artery stenosis  . Morbid obesity ([place])   . Obstructive sleep apnea on CPAP   . Paroxysmal Afib/Flutter    a. dccv: 08/2011 - on  amiodarone/coumadin   Past Surgical History:  Procedure Laterality Date  . APPENDECTOMY    . ATRIAL FLUTTER ABLATION N/A 09/24/2011   Procedure: ATRIAL FLUTTER ABLATION;  Surgeon: [name], MD;  Location: [facility] CATH LAB;  Service: Cardiovascular;  Laterality: N/A;  . CARDIOVERSION  10/22/2011   Procedure: CARDIOVERSION;  Surgeon: [name], MD;  Location: [place];  Service: Cardiovascular;  Laterality: N/A;  . CARDIOVERSION N/A 09/10/2011   Procedure: CARDIOVERSION;  Surgeon: [name], MD;  Location: [facility] C[facility] LAB;  Service: Cardiovascular;  Laterality: N/A;  . CHOLECYSTECTOMY    . TONSILLECTOMY  1982  . TOTAL ABDOMINAL HYSTERECTOMY      Current Outpatient Medications  Medication Sig Dispense Refill  . allopurinol (ZYLOPRIM) 300 MG tablet Take 300 mg by mouth daily.     Marland Kitchen amiodarone (PACERONE) 200 MG tablet TAKE 1 TABLET BY MOUTH DAILY MONDAY THROUGH FRIDAY ONLY 90 tablet 2  . cloNIDine (CATAPRES) 0.2 MG tablet Take 0.2 mg by mouth 2 (two) times daily.    Marland Kitchen diltiazem (CARDIZEM CD) 120 MG 24 hr capsule Take 1 capsule (120 mg total) by mouth 2 (two) times daily. 180 capsule 0  . eplerenone (INSPRA) 25 MG tablet Take 1 tablet (25 mg total) daily by mouth. 30 tablet 11  . furosemide (LASIX) 40 MG tablet Take 40 mg by  mouth daily.     . Insulin Lispro Prot & Lispro (HUMALOG 75/25 MIX) (75-25) 100 UNIT/ML Kwikpen Inject insulin according to sliding scale maximum dose 56. Before breakfast and before dinner    . Insulin Pen Needle 31G X 5 MM MISC by Does not apply route as directed.    Marland Kitchen ketorolac (ACULAR) 0.5 % ophthalmic solution Place 1 drop 2 (two) times daily into the right eye.    . levothyroxine (SYNTHROID, LEVOTHROID) 75 MCG tablet Take 75 mcg daily before breakfast by mouth.     Marland Kitchen lisinopril (PRINIVIL,ZESTRIL) 20 MG tablet Take 1 tablet (20 mg total) by mouth daily. 30 tablet 11  . rosuvastatin (CRESTOR) 10 MG tablet Take 10 mg by mouth every evening.    . sitaGLIPtin (JANUVIA)  100 MG tablet Take 100 mg daily by mouth.    . tobramycin-dexamethasone (TOBRADEX) ophthalmic ointment Place 1 application 2 (two) times daily into the right eye.    . warfarin (COUMADIN) 5 MG tablet TAKE 1 TABLET BY MOUTH DAILY AS DIRECTED 30 tablet 3  . amoxicillin-clavulanate (AUGMENTIN) 875-125 MG tablet Take 1 tablet by mouth 2 (two) times daily. One po bid x 7 days (Patient not taking: Reported on 06/11/2018) 14 tablet 0  . guaiFENesin (MUCINEX) 600 MG 12 hr tablet Take 2 tablets (1,200 mg total) by mouth 2 (two) times daily. (Patient not taking: Reported on 06/08/2018) 20 tablet 0  . HYDROcodone-acetaminophen (NORCO/VICODIN) 5-325 MG tablet Take 1 tablet by mouth every 4 (four) hours as needed for moderate pain. (Patient not taking: Reported on 06/08/2018) 10 tablet 0   No current facility-administered medications for this encounter.     Allergies  Allergen Reactions  . Metformin Diarrhea    Social History   Socioeconomic History  . Marital status: Married    Spouse name: Not on file  . Number of children: Y  . Years of education: Not on file  . Highest education level: Not on file  Occupational History  . Occupation: DISABILITY/housewife    Employer: RETIRED  Social Needs  . Financial resource strain: Not on file  . Food insecurity:    Worry: Not on file    Inability: Not on file  . Transportation needs:    Medical: Not on file    Non-medical: Not on file  Tobacco Use  . Smoking status: Never Smoker  . Smokeless tobacco: Never Used  Substance and Sexual Activity  . Alcohol use: No  . Drug use: No  . Sexual activity: Yes  Lifestyle  . Physical activity:    Days per week: Not on file    Minutes per session: Not on file  . Stress: Not on file  Relationships  . Social connections:    Talks on phone: Not on file    Gets together: Not on file    Attends religious service: Not on file    Active member of club or organization: Not on file    Attends meetings of clubs  or organizations: Not on file    Relationship status: Not on file  . Intimate partner violence:    Fear of current or ex partner: Not on file    Emotionally abused: Not on file    Physically abused: Not on file    Forced sexual activity: Not on file  Other Topics Concern  . Not on file  Social History Narrative  . Not on file    Family History  Problem Relation Age of Onset  .  Heart disease Father   . Hypertension Father   . Breast cancer Sister   . Cancer Sister        breast    ROS- All systems are reviewed and negative except as per the HPI above  Physical Exam: Vitals:   06/11/18 1103  BP: 124/68  Pulse: (!) 57  Weight: 122.6 kg  Height: 5\' 1"  (1.549 m)   Wt Readings from Last 3 Encounters:  06/11/18 122.6 kg  06/08/18 122.9 kg  03/23/18 123.6 kg    Labs: Lab Results  Component Value Date   NA 137 02/13/2018   K 4.0 02/13/2018   CL 100 (L) 02/13/2018   CO2 27 02/13/2018   GLUCOSE 231 (H) 02/13/2018   BUN 19 02/13/2018   CREATININE 0.93 02/13/2018   CALCIUM 8.5 (L) 02/13/2018   MG 1.9 08/03/2015   Lab Results  Component Value Date   INR 2.1 05/17/2018   Lab Results  Component Value Date   CHOL 207 (H) 04/06/2013   HDL 58 04/06/2013   LDLCALC 112 (H) 04/06/2013   TRIG 186 (H) 04/06/2013     GEN- The patient is well appearing, alert and oriented x 3 today.   Head- normocephalic, atraumatic Eyes-  Sclera clear, conjunctiva pink Ears- hearing intact Oropharynx- clear Neck- supple, no JVP Lymph- no cervical lymphadenopathy Lungs- Clear to ausculation bilaterally, normal work of breathing Heart-  regular rate and rhythm, no murmurs, rubs or gallops, PMI not laterally displaced GI- soft, NT, ND, + BS Extremities- no clubbing, cyanosis, or edema MS- no significant deformity or atrophy Skin- no rash or lesion Psych- euthymic mood, full affect Neuro- strength and sensation are intact  EKG-Sinus brady at 57 bpm, pr int 218 ms, qrs int 80 ms, qtc  461 ms Epic records reviewed Echo-Study Conclusions  - Left ventricle: The cavity size was normal. Systolic function was   normal. The estimated ejection fraction was in the range of 60%   to 65%. Wall motion was normal; there were no regional wall   motion abnormalities. Features are consistent with a pseudonormal   left ventricular filling pattern, with concomitant abnormal   relaxation and increased filling pressure (grade 2 diastolic   dysfunction). Doppler parameters are consistent with high   ventricular filling pressure. - Aortic valve: Transvalvular velocity was within the normal range.   There was no stenosis. There was no regurgitation. - Mitral valve: Transvalvular velocity was within the normal range.   There was no evidence for stenosis. There was mild regurgitation.   Valve area by pressure half-time: 1.79 cm^2. Valve area by   continuity equation (using LVOT flow): 1.1 cm^2. - Left atrium: The atrium was severely dilated. - Right ventricle: The cavity size was normal. Wall thickness was   normal. Systolic function was normal. - Tricuspid valve: There was trivial regurgitation. - Pulmonary arteries: Systolic pressure was within the normal   range. PA peak pressure: 25 mm Hg (S).    Assessment and Plan: 1. Persistent afib Continue amiodarone at 200 mg qd She went back into rhythm with  cardizem 120 mg bid, pt reduced back to 120 mg  daily 3 days after she returned to SR, reminded that she could use this approach in the future if she gets out of rhythm  Discussed she amy need possible cardioversion  2. Chadsvasc score of at least 5 Continue warfarin    F/u with Ivar Drape as scheduled  In September   Yahira Timberman C. Kayleen Memos, ANP-C Afib  [facility] [address] [place], [place] [address] [phone]

## 2018-06-22 ENCOUNTER — Other Ambulatory Visit: Payer: Self-pay

## 2018-06-22 MED ORDER — WARFARIN SODIUM 5 MG PO TABS: 5.0000 mg | ORAL_TABLET | Freq: Every day | ORAL | 3 refills | Status: DC

## 2018-06-22 NOTE — Telephone Encounter (Signed)
Fax received from [facility] Requesting Refill for Warfarin.

## 2018-06-23 ENCOUNTER — Encounter: Payer: Self-pay | Admitting: Internal Medicine

## 2018-06-23 ENCOUNTER — Other Ambulatory Visit: Payer: Self-pay | Admitting: *Deleted

## 2018-06-23 MED ORDER — WARFARIN SODIUM 5 MG PO TABS: 5.0000 mg | ORAL_TABLET | Freq: Every day | ORAL | 0 refills | Status: DC

## 2018-07-02 ENCOUNTER — Ambulatory Visit (INDEPENDENT_AMBULATORY_CARE_PROVIDER_SITE_OTHER): Payer: Medicare Other

## 2018-07-02 ENCOUNTER — Ambulatory Visit: Payer: Medicare Other | Admitting: Nurse Practitioner

## 2018-07-02 DIAGNOSIS — Z5181 Encounter for therapeutic drug level monitoring: Secondary | ICD-10-CM

## 2018-07-02 DIAGNOSIS — I4891 Unspecified atrial fibrillation: Secondary | ICD-10-CM | POA: Diagnosis not present

## 2018-07-02 LAB — POCT INR: INR: 3 (ref 2.0–3.0)

## 2018-07-02 NOTE — Patient Instructions (Signed)
Description   Continue on same dosage 1 tablet everyday except 1/2 tablet on Wednesdays and Fridays.  Recheck INR in 6 weeks.  Call with any new medications of if scheduled for any procedures [phone]     

## 2018-07-08 ENCOUNTER — Ambulatory Visit (INDEPENDENT_AMBULATORY_CARE_PROVIDER_SITE_OTHER): Payer: Medicare Other | Admitting: Internal Medicine

## 2018-07-08 ENCOUNTER — Encounter: Payer: Self-pay | Admitting: Internal Medicine

## 2018-07-08 VITALS — BP 122/76 | HR 59 | Ht 61.0 in | Wt 271.8 lb

## 2018-07-08 DIAGNOSIS — I481 Persistent atrial fibrillation: Secondary | ICD-10-CM

## 2018-07-08 DIAGNOSIS — I10 Essential (primary) hypertension: Secondary | ICD-10-CM

## 2018-07-08 DIAGNOSIS — Z5181 Encounter for therapeutic drug level monitoring: Secondary | ICD-10-CM | POA: Diagnosis not present

## 2018-07-08 DIAGNOSIS — I4819 Other persistent atrial fibrillation: Secondary | ICD-10-CM

## 2018-07-08 NOTE — Progress Notes (Signed)
Patient Care Team: Kristopher Glee., MD as PCP - General (Internal Medicine) Shawnee Knapp, MD as Resident (Family Medicine)   HPI  [name] is a 77 y.o. female seen in followup for atrial arrhythmias/fibrillation associated with diastolic heart failure. She's been tried on flecainide, dofetilide, and most recently has been on amiodarone.    She has had recurrent paroxysms of atrial fibrillation; seen in the A. fib clinic and started on diltiazem..  She has been feeling pretty good.  Some shortness of breath.  No edema.  Is having problem with her left knee.  DATE TEST EF   11/12 Echo   60-65 %   11/15 Echo   60-65 % MR mild-mod  8/17 Echo  60-65% LAE (47/2.1/38)   Date Cr K Hgb TSH LFTs PFTs  8/17    2.444    3/18    0.175    4/19  0.93 4.0 13.3   17 106%   6/19 0.75 4.1               Past Medical History:  Diagnosis Date  . Arthritis   . Back pain   . Chronic anticoagulation    due to aflutter  . Chronic kidney disease   . Diabetes mellitus   . Diastolic CHF, chronic ([place])    a.  echo 2006 - ef 55-65%; mild diast dysfxn;    b. Echo 08/2011: Mild LVH, EF 60%;  c. 04/2013 Echo: EF 65-69%, mild conc LVH;  08/2014 Echo: EF 60-65%, mild-mod MR.  . Gout   . Hyperlipidemia   . Hypertension    a.  Renal arterial Dopplers 12/2011: 1-59% right renal artery stenosis  . Morbid obesity ([place])   . Obstructive sleep apnea on CPAP   . Paroxysmal Afib/Flutter    a. 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 APPENDECTOMY    . ATRIAL FLUTTER ABLATION N/A 09/24/2011   Procedure: ATRIAL FLUTTER ABLATION;  Surgeon: [name], MD;  Location: [facility] CATH LAB;  Service: Cardiovascular;  Laterality: N/A;  . CARDIOVERSION  10/22/2011   Procedure: CARDIOVERSION;  Surgeon: [name], MD;  Location: [place];  Service: Cardiovascular;  Laterality: N/A;  . CARDIOVERSION N/A 09/10/2011   Procedure: CARDIOVERSION;  Surgeon:  [name], MD;  Location: [facility] CATH LAB;  Service: Cardiovascular;  Laterality: N/A;  . CHOLECYSTECTOMY    . TONSILLECTOMY  1982  . TOTAL ABDOMINAL HYSTERECTOMY      Current Outpatient Medications  Medication Sig Dispense Refill  . allopurinol (ZYLOPRIM) 300 MG tablet Take 300 mg by mouth daily.     Marland Kitchen amiodarone (PACERONE) 200 MG tablet TAKE 1 TABLET BY MOUTH DAILY MONDAY THROUGH FRIDAY ONLY 90 tablet 2  . cloNIDine (CATAPRES) 0.2 MG tablet Take 0.2 mg by mouth 2 (two) times daily.    Marland Kitchen diltiazem (CARDIZEM CD) 120 MG 24 hr capsule Take 1 capsule (120 mg total) by mouth 2 (two) times daily. 180 capsule 0  . eplerenone (INSPRA) 25 MG tablet Take 1 tablet (25 mg total) daily by mouth. 30 tablet 11  . furosemide (LASIX) 40 MG tablet Take 40 mg by mouth daily.     . Insulin Lispro Prot & Lispro (HUMALOG 75/25 MIX) (75-25) 100 UNIT/ML Kwikpen Inject insulin according to sliding scale maximum dose 56. Before breakfast and before dinner    . levothyroxine (SYNTHROID, LEVOTHROID) 75 MCG tablet Take 75 mcg  daily before breakfast by mouth.     Marland Kitchen lisinopril (PRINIVIL,ZESTRIL) 20 MG tablet Take 1 tablet (20 mg total) by mouth daily. 30 tablet 11  . rosuvastatin (CRESTOR) 10 MG tablet Take 10 mg by mouth every evening.    . sitaGLIPtin (JANUVIA) 100 MG tablet Take 100 mg daily by mouth.    . warfarin (COUMADIN) 5 MG tablet Take 1 tablet (5 mg total) by mouth daily. as directed 90 tablet 0   No current facility-administered medications for this visit.     Allergies  Allergen Reactions  . Metformin Diarrhea    Review of Systems negative except from HPI and PMH  Physical Exam BP 122/76   Pulse (!) 59   Ht 5\' 1"  (1.549 m)   Wt 271 lb 12.8 oz (123.3 kg)   SpO2 96%   BMI 51.36 kg/m  Well developed and Morbidly obese   in no acute distress HENT normal Neck supple with JVP-flat Clear Regular rate and rhythm, no murmurs or gallops Abd-soft with active BS No Clubbing cyanosis edema Skin-warm  and dry A & Oriented  Grossly normal sensory and motor function    ECG demonstrates SINUS @ 70  Assessment and  Plan  Hypertension   Persistent atrial fibrillation   HFpEF   High Risk Medication Surveillance  Morbidly obese      No interval atrial fibrillation,  Will continue her current meds  dilt has augmented control  Will continue   We might consider adjunctive ranolazine  Euvolemic continue current meds  BP reasonably controlled

## 2018-07-08 NOTE — Patient Instructions (Signed)
Medication Instructions:  Your physician recommends that you continue on your current medications as directed. Please refer to the Current Medication list given to you today.  Labwork: None ordered.   Testing/Procedures: None ordered.  Follow-Up: Your physician recommends that you schedule a follow-up appointment in:   6 months with Roderic Palau  Any Other Special Instructions Will Be Listed Below (If Applicable).     If you need a refill on your cardiac medications before your next appointment, please call your pharmacy.

## 2018-07-09 ENCOUNTER — Telehealth: Payer: Self-pay | Admitting: *Deleted

## 2018-07-09 NOTE — Telephone Encounter (Signed)
Patient left a message a voicemail stating that she can't reach anyone when she calls and she always has to leave a voicemail. Returned a call to the pt and instructed her that we do not have a person that sits and answers the phone and that we see pt's all day and in between pt's we check the voicemail and then we call back, like I am doing now. Also, pt is on Doxy 100mg  BID & started on 07/08/18, pt made aware taht the med interacts with Coumadin. Appt set for Monday.

## 2018-07-12 ENCOUNTER — Ambulatory Visit (INDEPENDENT_AMBULATORY_CARE_PROVIDER_SITE_OTHER): Payer: Medicare Other

## 2018-07-12 DIAGNOSIS — Z5181 Encounter for therapeutic drug level monitoring: Secondary | ICD-10-CM

## 2018-07-12 DIAGNOSIS — I4891 Unspecified atrial fibrillation: Secondary | ICD-10-CM

## 2018-07-12 LAB — POCT INR: INR: 2.7 (ref 2.0–3.0)

## 2018-07-12 NOTE — Patient Instructions (Signed)
Description   Continue on same dosage 1 tablet everyday except 1/2 tablet on Wednesdays and Fridays.  Recheck INR in 6 weeks.  Call with any new medications of if scheduled for any procedures [phone]     

## 2018-08-17 ENCOUNTER — Other Ambulatory Visit: Payer: Self-pay | Admitting: Nurse Practitioner

## 2018-08-17 ENCOUNTER — Other Ambulatory Visit: Payer: Self-pay | Admitting: Internal Medicine

## 2018-08-23 ENCOUNTER — Ambulatory Visit (INDEPENDENT_AMBULATORY_CARE_PROVIDER_SITE_OTHER): Payer: Medicare Other

## 2018-08-23 DIAGNOSIS — I4891 Unspecified atrial fibrillation: Secondary | ICD-10-CM

## 2018-08-23 DIAGNOSIS — Z5181 Encounter for therapeutic drug level monitoring: Secondary | ICD-10-CM | POA: Diagnosis not present

## 2018-08-23 LAB — POCT INR: INR: 2.1 (ref 2.0–3.0)

## 2018-08-23 NOTE — Patient Instructions (Signed)
Description   Continue on same dosage 1 tablet everyday except 1/2 tablet on Wednesdays and Fridays.  Recheck INR in 6 weeks.  Call with any new medications of if scheduled for any procedures [phone]     

## 2018-09-05 DIAGNOSIS — R011 Cardiac murmur, unspecified: Secondary | ICD-10-CM | POA: Insufficient documentation

## 2018-10-08 ENCOUNTER — Other Ambulatory Visit: Payer: Self-pay | Admitting: Internal Medicine

## 2018-10-11 NOTE — Telephone Encounter (Signed)
Pt missed last appt, called pt scheduled f/u appt for 10/14/18.  Pt has enough tablets to last till OV.

## 2018-10-14 ENCOUNTER — Ambulatory Visit (INDEPENDENT_AMBULATORY_CARE_PROVIDER_SITE_OTHER): Payer: Medicare Other | Admitting: *Deleted

## 2018-10-14 DIAGNOSIS — I4891 Unspecified atrial fibrillation: Secondary | ICD-10-CM | POA: Diagnosis not present

## 2018-10-14 DIAGNOSIS — Z5181 Encounter for therapeutic drug level monitoring: Secondary | ICD-10-CM | POA: Diagnosis not present

## 2018-10-14 LAB — POCT INR: INR: 2.8 (ref 2.0–3.0)

## 2018-10-14 NOTE — Patient Instructions (Signed)
Description   Continue on same dosage 1 tablet everyday except 1/2 tablet on Wednesdays and Fridays.  Recheck INR in 6 weeks.  Call with any new medications of if scheduled for any procedures [phone]

## 2018-11-03 ENCOUNTER — Other Ambulatory Visit: Payer: Self-pay | Admitting: Family Medicine

## 2018-11-03 DIAGNOSIS — Z1231 Encounter for screening mammogram for malignant neoplasm of breast: Secondary | ICD-10-CM

## 2018-11-29 ENCOUNTER — Ambulatory Visit (INDEPENDENT_AMBULATORY_CARE_PROVIDER_SITE_OTHER): Payer: Medicare Other

## 2018-11-29 DIAGNOSIS — Z5181 Encounter for therapeutic drug level monitoring: Secondary | ICD-10-CM

## 2018-11-29 DIAGNOSIS — I4891 Unspecified atrial fibrillation: Secondary | ICD-10-CM

## 2018-11-29 LAB — POCT INR: INR: 2.9 (ref 2.0–3.0)

## 2018-11-29 NOTE — Patient Instructions (Signed)
Description   Continue on same dosage 1 tablet everyday except 1/2 tablet on Wednesdays and Fridays.  Recheck INR in 6 weeks.  Call with any new medications of if scheduled for any procedures [phone]

## 2018-12-01 ENCOUNTER — Other Ambulatory Visit (HOSPITAL_COMMUNITY): Payer: Self-pay | Admitting: Nurse Practitioner

## 2018-12-09 ENCOUNTER — Ambulatory Visit
Admission: RE | Admit: 2018-12-09 | Discharge: 2018-12-09 | Disposition: A | Discharge status: Home / Self Care | Payer: Medicare Other | Source: Ambulatory Visit | Attending: Family Medicine | Admitting: Family Medicine

## 2018-12-09 DIAGNOSIS — Z1231 Encounter for screening mammogram for malignant neoplasm of breast: Secondary | ICD-10-CM

## 2018-12-29 ENCOUNTER — Other Ambulatory Visit (HOSPITAL_COMMUNITY): Payer: Self-pay | Admitting: Nurse Practitioner

## 2019-01-05 DIAGNOSIS — M898X1 Other specified disorders of bone, shoulder: Secondary | ICD-10-CM | POA: Insufficient documentation

## 2019-01-07 ENCOUNTER — Telehealth: Payer: Self-pay

## 2019-01-07 ENCOUNTER — Other Ambulatory Visit: Payer: Self-pay

## 2019-01-07 ENCOUNTER — Ambulatory Visit (INDEPENDENT_AMBULATORY_CARE_PROVIDER_SITE_OTHER): Payer: Medicare Other | Admitting: *Deleted

## 2019-01-07 DIAGNOSIS — Z5181 Encounter for therapeutic drug level monitoring: Secondary | ICD-10-CM | POA: Diagnosis not present

## 2019-01-07 DIAGNOSIS — I4891 Unspecified atrial fibrillation: Secondary | ICD-10-CM | POA: Diagnosis not present

## 2019-01-07 LAB — POCT INR: INR: 2.5 (ref 2.0–3.0)

## 2019-01-07 NOTE — Telephone Encounter (Signed)
1. Have you recently travelled abroad or to [place], [place], or [place]? no 2. Do you currently have a fever? no 3. Have you been in contact with someone that is currently pending confirmation of COVID19 testing or has been confirmed to have the COVID19 virus? no 4. Are you currently experiencing fatigue or cough? no 5. Are you currently experiencing new or worsening shortness of breath at rest or with minimal activity? yes 6. Have you been in contact with someone that was recently sick with fever/cough/fatigue?no   **A score of 4 or more should result in cancellation of the pts cardiology appt  **A score of 2 should be provided a mask prior to admission into the lobby  **TRAVEL to a high risk area or contact with a confirmed case should stay at home, away from confirmed patient, monitor symptoms, and reach out to PCP for evisit, additional testing.   **ALL PTS WITH FEVER SHOULD BE REFERRED TO PCP FOR EVISIT  Pt. Advised that we are restricting visitors at this time and request that only patients present for check-in prior to their appointment. All other visitors should remain in their car. If necessary, only one visitor may come with the patient into the building. For everyone's safety, all patients and visitors entering our practice area should expect to be screened again prior to entering our waiting area.

## 2019-01-07 NOTE — Patient Instructions (Signed)
Description   Continue on same dosage 1 tablet everyday except 1/2 tablet on Wednesdays and Fridays.  Recheck INR in 6-8 weeks.  Call with any new medications of if scheduled for any procedures [phone]

## 2019-01-17 ENCOUNTER — Other Ambulatory Visit: Payer: Self-pay

## 2019-01-17 ENCOUNTER — Ambulatory Visit (HOSPITAL_COMMUNITY)
Admission: RE | Admit: 2019-01-17 | Discharge: 2019-01-17 | Disposition: A | Discharge status: Home / Self Care | Payer: Medicare Other | Source: Ambulatory Visit | Attending: Nurse Practitioner | Admitting: Nurse Practitioner

## 2019-01-17 DIAGNOSIS — I480 Paroxysmal atrial fibrillation: Secondary | ICD-10-CM | POA: Diagnosis not present

## 2019-01-17 NOTE — Progress Notes (Signed)
Electrophysiology TeleHealth Note   Due to national recommendations of social distancing due to [place] 19, Audio telehealth visit is felt to be most appropriate for this patient at this time.  See MyChart message/consent below from today for patient consent regarding telehealth for the [facility].    Date:  01/17/2019   ID:  [name], DOB 02-04-1941, MRN [id number]  Location: home  Provider location: [address] [place], [place] [address] Evaluation Performed: Follow up for paroxysmal afib  PCP:  Lauraine Rinne, MD  Primary Cardiologist:  none Primary Electrophysiologist: Dr. Caryl Comes    CC: afib    History of Present Illness: [name] is a 78 y.o. female who presents via audio conferencing for a telehealth visit today.   The patient is referred for f/u afib. She reports that she is doing well. She had 4 days of afib several weeks ago and took her   Diltiazem bid  at that time. Normally takes it daily. She was not that symptomatic with it. Otherwise in SR. Continues on amiodarone 200 mg daily.. Was seen by her PCP a few weeks ago and had labs drawn. She reports that that liver, thyroid, hgb A1c were wnl. Compliance with cpap. Last INR 2.5.   Today, she denies symptoms of palpitations, chest pain, shortness of breath, orthopnea, PND, lower extremity edema, claudication, dizziness, presyncope, syncope, bleeding, or neurologic sequela. The patient is tolerating medications without difficulties and is otherwise without complaint today.   she denies symptoms of cough, fevers, chills, or new SOB worrisome for COVID 19.     Atrial Fibrillation Risk Factors:  she does have symptoms or diagnosis of sleep apnea. she is compliant with CPAP therapy. she does not have a history of rheumatic fever. she does not have a history of alcohol use. The patient does not have a history of early familial atrial fibrillation or other arrhythmias.  she has a BMI of  There is no height or weight on file to calculate BMI.. There were no vitals filed for this visit.  Past Medical History:  Diagnosis Date  . Arthritis   . Back pain   . Chronic anticoagulation    due to aflutter  . Chronic kidney disease   . Diabetes mellitus   . Diastolic CHF, chronic ([place])    a.  echo 2006 - ef 55-65%; mild diast dysfxn;    b. Echo 08/2011: Mild LVH, EF 60%;  c. 04/2013 Echo: EF 65-69%, mild conc LVH;  08/2014 Echo: EF 60-65%, mild-mod MR.  . Gout   . Hyperlipidemia   . Hypertension    a.  Renal arterial Dopplers 12/2011: 1-59% right renal artery stenosis  . Morbid obesity ([place])   . Obstructive sleep apnea on CPAP   . Paroxysmal Afib/Flutter    a. 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   Past Surgical History:  Procedure Laterality Date  . APPENDECTOMY    . ATRIAL FLUTTER ABLATION N/A 09/24/2011   Procedure: ATRIAL FLUTTER ABLATION;  Surgeon: [name], MD;  Location[facility] CATH LAB;  Service: Cardiovascular;  Laterality: N/A;  . CARDIOVERSION  10/22/2011   Procedure: CARDIOVERSION;  Surgeon: [name], MD;  Location: [facility];  Service: Cardiovascular;  Laterality: N/A;  . CARDIOVERSION N/A 09/10/2011   Procedure: CARDIOVERSION;  Surgeon: [name], MD;  Location: [facility] CATH LAB;  Service: Cardiovascular;  Laterality: N/A;  . CHOLECYSTECTOMY    . TONSILLECTOMY  1982  . TOTAL ABDOMINAL HYSTERECTOMY  Current Outpatient Medications  Medication Sig Dispense Refill  . allopurinol (ZYLOPRIM) 300 MG tablet Take 300 mg by mouth daily.     Marland Kitchen amiodarone (PACERONE) 200 MG tablet TAKE 1 TABLET BY MOUTH DAILY. MONDAY THROUGH FRIDAY ONLY 90 tablet 3  . cloNIDine (CATAPRES) 0.2 MG tablet Take 0.2 mg by mouth 2 (two) times daily.    Marland Kitchen diltiazem (CARDIZEM CD) 120 MG 24 hr capsule TAKE 1 CAPSULE BY MOUTH TWICE DAILY 180 capsule 0  . eplerenone (INSPRA) 25 MG tablet TAKE 1 TABLET(25 MG) BY MOUTH DAILY 90 tablet 3  . furosemide (LASIX) 40 MG tablet Take 40 mg by  mouth daily.     . Insulin Lispro Prot & Lispro (HUMALOG 75/25 MIX) (75-25) 100 UNIT/ML Kwikpen Inject insulin according to sliding scale maximum dose 56. Before breakfast and before dinner    . levothyroxine (SYNTHROID, LEVOTHROID) 75 MCG tablet Take 75 mcg daily before breakfast by mouth.     Marland Kitchen lisinopril (PRINIVIL,ZESTRIL) 20 MG tablet Take 1 tablet (20 mg total) by mouth daily. 30 tablet 11  . rosuvastatin (CRESTOR) 10 MG tablet Take 10 mg by mouth every evening.    . sitaGLIPtin (JANUVIA) 100 MG tablet Take 100 mg daily by mouth.    . warfarin (COUMADIN) 5 MG tablet TAKE 1 TABLET BY MOUTH DAILY AS DIRECTED 30 tablet 3   No current facility-administered medications for this encounter.     Allergies:   Metformin   Social History:  The patient  reports that she has never smoked. She has never used smokeless tobacco. She reports that she does not drink alcohol or use drugs.   Family History:  The patient's  family history includes Breast cancer in her sister; Cancer in her sister; Heart disease in her father; Hypertension in her father.    ROS:  Please see the history of present illness.   All other systems are personally reviewed and negative.   Exam: N/A  Recent Labs: 02/13/2018: ALT 17; B Natriuretic Peptide 78.3; BUN 19; Creatinine, Ser 0.93; Hemoglobin 13.3; Platelets 321; Potassium 4.0; Sodium 137 03/23/2018: TSH 1.720  personally reviewed    Other studies personally reviewed: Epic records reviewed   ASSESSMENT AND PLAN:  1.  Paroxysmal atrial fibrillation Cotninue amiodarone 200 mg daily Continue diltiazem 120 mg daily but takes bid for episodes of afib Continue warfarin  This patients CHA2DS2-VASc Score and unadjusted Ischemic Stroke Rate (% per year) is equal to 9.7 % stroke rate/year from a score of 6  Above score calculated as 1 point each if present [CHF, HTN, DM, Vascular=MI/PAD/Aortic Plaque, Age if 65-74, or Female] Above score calculated as 2 points each if  present [Age > 75, or Stroke/TIA/TE]  2.Htn BP this am 137/77 HR 65 and regular, per pt  3. Diastolic HF Weight stable  4. DM Reports Hb A1c of 7   COVID screen The patient does not have any symptoms that suggest any further testing/ screening at this time.  Social distancing reinforced today.    Follow-up:   6  months  Current medicines are reviewed at length with the patient today.   The patient does not have concerns regarding her medicines.  The following changes were made today:  none  Labs/ tests ordered today include: none No orders of the defined types were placed in this encounter.   Patient Risk:  after full review of this patients clinical status, I feel that they are at  mod risk at this time.  Today, I have spent 10 minutes with the patient with telehealth technology discussing afib/chronic disease management   Signed, Roderic Palau NP  01/17/2019 11:05 AM  Afib [facility] [address] [place], [place] [address] [phone]    I hereby voluntarily request, consent and authorize the [facility] and its employed or contracted physicians, physician assistants, nurse practitioners or other licensed health care professionals (the Practitioner), to provide me with telemedicine health care services (the "Services") as deemed necessary by the treating Practitioner. I acknowledge and consent to receive the Services by the Practitioner via telemedicine. I understand that the telemedicine visit will involve communicating with the Practitioner through live audiovisual communication technology and the disclosure of certain medical information by electronic transmission. I acknowledge that I have been given the opportunity to request an in-person assessment or other available alternative prior to the telemedicine visit and am voluntarily participating in the telemedicine visit.   I understand that I have the right to withhold or withdraw my  consent to the use of telemedicine in the course of my care at any time, without affecting my right to future care or treatment, and that the Practitioner or I may terminate the telemedicine visit at any time. I understand that I have the right to inspect all information obtained and/or recorded in the course of the telemedicine visit and may receive copies of available information for a reasonable fee.  I understand that some of the potential risks of receiving the Services via telemedicine include:   Delay or interruption in medical evaluation due to technological equipment failure or disruption;  Information transmitted may not be sufficient (e.g. poor resolution of images) to allow for appropriate medical decision making by the Practitioner; and/or  In rare instances, security protocols could fail, causing a breach of personal health information.   Furthermore, I acknowledge that it is my responsibility to provide information about my medical history, conditions and care that is complete and accurate to the best of my ability. I acknowledge that Practitioner's advice, recommendations, and/or decision may be based on factors not within their control, such as incomplete or inaccurate data provided by me or distortions of diagnostic images or specimens that may result from electronic transmissions. I understand that the practice of medicine is not an exact science and that Practitioner makes no warranties or guarantees regarding treatment outcomes. I acknowledge that I will receive a copy of this consent concurrently upon execution via email to the email address I last provided but may also request a printed copy by calling the office of the [facility].  I understand that my insurance will be billed for this visit.   I have read or had this consent read to me.  I understand the contents of this consent, which adequately explains the benefits and risks of the Services being provided via  telemedicine.  I have been provided ample opportunity to ask questions regarding this consent and the Services and have had my questions answered to my satisfaction.  I give my informed consent for the services to be provided through the use of telemedicine in my medical care  By participating in this telemedicine visit I agree to the above.

## 2019-02-04 ENCOUNTER — Other Ambulatory Visit: Payer: Self-pay | Admitting: Internal Medicine

## 2019-02-16 DIAGNOSIS — L03119 Cellulitis of unspecified part of limb: Secondary | ICD-10-CM | POA: Insufficient documentation

## 2019-02-16 DIAGNOSIS — R1032 Left lower quadrant pain: Secondary | ICD-10-CM | POA: Insufficient documentation

## 2019-02-16 DIAGNOSIS — I83893 Varicose veins of bilateral lower extremities with other complications: Secondary | ICD-10-CM | POA: Insufficient documentation

## 2019-02-16 DIAGNOSIS — R600 Localized edema: Secondary | ICD-10-CM | POA: Insufficient documentation

## 2019-02-16 DIAGNOSIS — R1013 Epigastric pain: Secondary | ICD-10-CM | POA: Insufficient documentation

## 2019-02-16 DIAGNOSIS — L299 Pruritus, unspecified: Secondary | ICD-10-CM | POA: Insufficient documentation

## 2019-03-03 ENCOUNTER — Telehealth: Payer: Self-pay

## 2019-03-03 NOTE — Telephone Encounter (Signed)

## 2019-03-04 ENCOUNTER — Ambulatory Visit (INDEPENDENT_AMBULATORY_CARE_PROVIDER_SITE_OTHER): Payer: Medicare Other | Admitting: Pharmacist

## 2019-03-04 ENCOUNTER — Other Ambulatory Visit: Payer: Self-pay

## 2019-03-04 DIAGNOSIS — Z5181 Encounter for therapeutic drug level monitoring: Secondary | ICD-10-CM

## 2019-03-04 DIAGNOSIS — I4891 Unspecified atrial fibrillation: Secondary | ICD-10-CM

## 2019-03-04 LAB — POCT INR: INR: 3.6 — AB (ref 2.0–3.0)

## 2019-03-16 ENCOUNTER — Telehealth: Payer: Self-pay | Admitting: Internal Medicine

## 2019-03-16 ENCOUNTER — Telehealth (HOSPITAL_COMMUNITY): Payer: Self-pay | Admitting: *Deleted

## 2019-03-16 NOTE — Telephone Encounter (Signed)
Pt called in stating she had an episode of afib last night and had to take extra diltiazem. Making sure she doesn't need to be seen. Pt does endorse having diarrhea yesterday with about 3lbs of weight loss. Pt feels dehydrated. PT back in rhythm and feeling back to normal today. Pt will call if afib burden increases and we can move her appointment up at that point. Pt in agreement.

## 2019-03-16 NOTE — Telephone Encounter (Signed)
Pt c/o swelling: STAT is pt has developed SOB within 24 hours  1) How much weight have you gained and in what time span? Lost 3 pound   2) If swelling, where is the swelling located? stomach  3) Are you currently taking a fluid pill? Yes   4) Are you currently SOB? some  5) Do you have a log of your daily weights (if so, list)? no  6) Have you gained 3 pounds in a day or 5 pounds in a week? lost  7) Have you traveled recently? no

## 2019-03-16 NOTE — Telephone Encounter (Signed)
Talked with patient see previous phone note.

## 2019-03-17 DIAGNOSIS — R21 Rash and other nonspecific skin eruption: Secondary | ICD-10-CM | POA: Insufficient documentation

## 2019-03-17 DIAGNOSIS — K219 Gastro-esophageal reflux disease without esophagitis: Secondary | ICD-10-CM | POA: Insufficient documentation

## 2019-03-17 DIAGNOSIS — L282 Other prurigo: Secondary | ICD-10-CM | POA: Insufficient documentation

## 2019-03-29 ENCOUNTER — Other Ambulatory Visit (HOSPITAL_COMMUNITY): Payer: Self-pay | Admitting: Nurse Practitioner

## 2019-04-04 ENCOUNTER — Telehealth: Payer: Self-pay

## 2019-04-04 NOTE — Telephone Encounter (Signed)

## 2019-04-08 ENCOUNTER — Ambulatory Visit (INDEPENDENT_AMBULATORY_CARE_PROVIDER_SITE_OTHER): Payer: Medicare Other

## 2019-04-08 ENCOUNTER — Other Ambulatory Visit: Payer: Self-pay

## 2019-04-08 DIAGNOSIS — Z5181 Encounter for therapeutic drug level monitoring: Secondary | ICD-10-CM

## 2019-04-08 DIAGNOSIS — I4891 Unspecified atrial fibrillation: Secondary | ICD-10-CM

## 2019-04-08 LAB — POCT INR: INR: 3.5 — AB (ref 2.0–3.0)

## 2019-04-08 NOTE — Patient Instructions (Signed)
Description   Skip today's dosage of Coumadin, then start taking 1 tablet everyday except 1/2 tablet on Mondays, Wednesdays and Fridays.  Recheck INR in 3 weeks.  Call with any new medications of if scheduled for any procedures [phone]

## 2019-04-25 ENCOUNTER — Telehealth: Payer: Self-pay

## 2019-04-25 NOTE — Telephone Encounter (Signed)
lmom for prescreen  

## 2019-04-26 ENCOUNTER — Telehealth: Payer: Self-pay | Admitting: *Deleted

## 2019-04-26 NOTE — Telephone Encounter (Signed)
   [facility] Pre-operative Risk Assessment    Request for surgical clearance:  1. What type of surgery is being performed? COLONOSCOPY/ENDOSCOPY  2. When is this surgery scheduled? 05/03/19  3. What type of clearance is required (medical clearance vs. Pharmacy clearance to hold med vs. Both)? BOTH  4. Are there any medications that need to be held prior to surgery and how long? WARFARIN  5. Practice name and name of physician performing surgery?  [facility]; DR. Paulita Fujita  6. What is your office phone number [phone]   7.   What is your office fax number [phone]  8.   Anesthesia type (None, local, MAC, general) ? NONE LISTED; PROPOFOL?    Julaine Hua 04/26/2019, 6:18 PM  _________________________________________________________________   (provider comments below)

## 2019-04-27 NOTE — Telephone Encounter (Signed)
   Primary Cardiologist: Dr. Caryl Comes  Chart reviewed as part of pre-operative protocol coverage. Patient was contacted 04/27/2019 in reference to pre-operative risk assessment for pending surgery as outlined below.  Roanne Haye was last seen on 01/17/2019 by Roderic Palau NP.  Since that day, Taylormarie Register has done well without any chest pain or shortness of breath.  Therefore, based on ACC/AHA guidelines, the patient would be at acceptable risk for the planned procedure without further cardiovascular testing.   I will route this recommendation to the requesting party via Epic fax function and remove from pre-op pool.  Please call with questions. See additional recommendation by our clinical pharmacist who has already spoken with the patient.   [place], [place] 04/27/2019, 5:00 PM

## 2019-04-27 NOTE — Telephone Encounter (Addendum)
Pt takes warfarin for afib with CHADS2VASc score of 6 (age x2, sex, CHF, HTN, DM). Ok to hold warfarin for 5 days prior to procedure. Patient's last dose will need to be today - pt is aware. I have also moved next INR check from this week to 1 week post procedure.

## 2019-05-09 ENCOUNTER — Telehealth: Payer: Self-pay

## 2019-05-09 NOTE — Telephone Encounter (Signed)

## 2019-05-11 ENCOUNTER — Other Ambulatory Visit: Payer: Self-pay

## 2019-05-12 ENCOUNTER — Ambulatory Visit (INDEPENDENT_AMBULATORY_CARE_PROVIDER_SITE_OTHER): Payer: Medicare Other | Admitting: *Deleted

## 2019-05-12 DIAGNOSIS — I4891 Unspecified atrial fibrillation: Secondary | ICD-10-CM | POA: Diagnosis not present

## 2019-05-12 DIAGNOSIS — Z5181 Encounter for therapeutic drug level monitoring: Secondary | ICD-10-CM

## 2019-05-12 LAB — POCT INR: INR: 1.7 — AB (ref 2.0–3.0)

## 2019-05-12 NOTE — Patient Instructions (Signed)
Description   Take 1.5 tablets today, then continue taking 1 tablet everyday except 1/2 tablet on Mondays, Wednesdays and Fridays.  Recheck INR in 2 weeks.  Call with any new medications of if scheduled for any procedures [phone]

## 2019-05-25 ENCOUNTER — Telehealth: Payer: Self-pay | Admitting: *Deleted

## 2019-05-25 NOTE — Telephone Encounter (Signed)
Pt called and stated she started Doxycycline 100mg  BID on 05/21/2019 and Gabapentin, she stated she forgot to call and just thought about it today. Advised that Doxycycline can increase the INR and she should have INR checked. Assessed the appts and she has an appt tomorrow at 11am; pt aware to have a green leafy vegetable today and we will see her tomorrow as the Doxycycline may have an effect on her INR.

## 2019-05-26 ENCOUNTER — Other Ambulatory Visit: Payer: Self-pay

## 2019-05-26 ENCOUNTER — Ambulatory Visit (INDEPENDENT_AMBULATORY_CARE_PROVIDER_SITE_OTHER): Payer: Medicare Other | Admitting: *Deleted

## 2019-05-26 DIAGNOSIS — Z5181 Encounter for therapeutic drug level monitoring: Secondary | ICD-10-CM

## 2019-05-26 DIAGNOSIS — I4891 Unspecified atrial fibrillation: Secondary | ICD-10-CM | POA: Diagnosis not present

## 2019-05-26 LAB — POCT INR: INR: 2.5 (ref 2.0–3.0)

## 2019-05-26 NOTE — Patient Instructions (Signed)
Description   Continue taking 1 tablet everyday except 1/2 tablet on Mondays, Wednesdays and Fridays.  Recheck INR in 3 weeks.  Call with any new medications of if scheduled for any procedures [phone]

## 2019-06-09 ENCOUNTER — Telehealth: Payer: Self-pay | Admitting: Internal Medicine

## 2019-06-09 NOTE — Telephone Encounter (Signed)
Pt reports swelling in her abdomen. She reports gaining 3 pounds in the past 3 days. Pts BP:165/66  HR: 66 She has not changed diet, has been taking her medications the same as before including 40mg  furosemide daily She has made an appt with PCP for tomorrow Has appt with [place] on 8/26 Routing to [place] for advisement

## 2019-06-09 NOTE — Telephone Encounter (Signed)
New message:    Patient calling because she thinks she is having some swelling in her stomach. I made a appt for next week. Patient would like for some to call her back.

## 2019-06-11 NOTE — Telephone Encounter (Signed)
plz have her increase her furosemide 40>>80 daily x 3 days  She will then be assessed wheen she sees [employer]

## 2019-06-12 NOTE — Progress Notes (Signed)
Cardiology Office Note   Date:  06/16/2019   ID:  [name], [name] 1940/12/22, MRN [id number]  PCP:  [name] Rinne, MD  Cardiologist: Dr. Caryl Comes, MD   Chief Complaint  Patient presents with  . Follow-up     History of Present Illness: [name] is a 78 y.o. female with a hx of PAF on AC with Coumdain and diastolic heart failure. She is followed closely with the AF clinic.   She has failed multiple antiarrythmics in the past including Flecainide, dofetilide, but has most recently been on and amiodarone. She was last seen by Dr. Caryl Comes on 07/08/2018 and was having some chronic SOB but no new symptoms. She was then seen by Roderic Palau on 01/17/2019. She continued to have episodes of AF in which she was having to take her Diltiaem QD>>BID to help with rate control. She was noted to be in NSR at that time.   She called the office on 06/09/2019 with c/o of abdominal swelling for which Dr. Caryl Comes increased her Lasix from 40 to 80 x 3 days.   Today she reports there was no significant change with the 3-day increase of her Lasix.  In speaking with her, her weight is actually down from her last office visit.  She has no orthopnea, LE swelling or PND.  Her biggest concern today is generalized fatigue which she says began during a prolonged hospitalization 4 years ago.  She states "she has not been the same since ".  We discussed that age could be a factor in her fatigue.  She states recently she had an episode of headache with some lip tingling in which she contacted her PCP.  Carotid Dopplers were performed however I do not see the results in epic yet.  A head CT along with an echocardiogram were also ordered however have not been scheduled or performed at this time.  We discussed having these results sent over to Dr. Olin Pia office for review.  She states she has not had an additional episode          Past Medical History:  Diagnosis Date  . Arthritis   . Back pain   .  Chronic anticoagulation    due to aflutter  . Chronic kidney disease   . Diabetes mellitus   . Diastolic CHF, chronic ([place])    a.  echo 2006 - ef 55-65%; mild diast dysfxn;    b. Echo 08/2011: Mild LVH, EF 60%;  c. 04/2013 Echo: EF 65-69%, mild conc LVH;  08/2014 Echo: EF 60-65%, mild-mod MR.  . Gout   . Hyperlipidemia   . Hypertension    a.  Renal arterial Dopplers 12/2011: 1-59% right renal artery stenosis  . Morbid obesity ([place])   . Obstructive sleep apnea on CPAP   . Paroxysmal Afib/Flutter    a. 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 APPENDECTOMY    . ATRIAL FLUTTER ABLATION N/A 09/24/2011   Procedure: ATRIAL FLUTTER ABLATION;  Surgeon: [name], MD;  Location: [facility] C[facility] LAB;  Service: Cardiovascular;  Laterality: N/A;  . CARDIOVERSION  10/22/2011   Procedure: CARDIOVERSION;  Surgeon: [name], MD;  Location: [place];  Service: Cardiovascular;  Laterality: N/A;  . CARDIOVERSION N/A 09/10/2011   Procedure: CARDIOVERSION;  Surgeon: [name], MD;  Location: [facility] CATH LAB;  Service: Cardiovascular;  Laterality: N/A;  . CHOLECYSTECTOMY    . TONSILLECTOMY  1982  .  TOTAL ABDOMINAL HYSTERECTOMY       Current Outpatient Medications  Medication Sig Dispense Refill  . allopurinol (ZYLOPRIM) 300 MG tablet Take 300 mg by mouth daily.     Marland Kitchen amiodarone (PACERONE) 200 MG tablet TAKE 1 TABLET BY MOUTH DAILY. MONDAY THROUGH FRIDAY ONLY 90 tablet 3  . cloNIDine (CATAPRES) 0.2 MG tablet Take 0.2 mg by mouth 2 (two) times daily.    Marland Kitchen diltiazem (CARDIZEM CD) 120 MG 24 hr capsule Take one capsule daily; may take twice a day during afib episode 120 capsule 0  . furosemide (LASIX) 40 MG tablet Take 40 mg by mouth daily.     . Insulin Lispro Prot & Lispro (HUMALOG 75/25 MIX) (75-25) 100 UNIT/ML Kwikpen Inject insulin according to sliding scale maximum dose 56. Before breakfast and before dinner    . levothyroxine (SYNTHROID, LEVOTHROID)  75 MCG tablet Take 75 mcg daily before breakfast by mouth.     Marland Kitchen lisinopril (PRINIVIL,ZESTRIL) 20 MG tablet Take 1 tablet (20 mg total) by mouth daily. 30 tablet 11  . rosuvastatin (CRESTOR) 10 MG tablet Take 10 mg by mouth every evening.    . Semaglutide,0.25 or 0.5MG /DOS, (OZEMPIC, 0.25 OR 0.5 MG/DOSE,) 2 MG/1.5ML SOPN Inject 0.4 mLs into the skin once a week.    . warfarin (COUMADIN) 5 MG tablet TAKE 1 TABLET BY MOUTH DAILY AS DIRECTED 30 tablet 3   No current facility-administered medications for this visit.     Allergies:   Metformin    Social History:  The patient  reports that she has never smoked. She has never used smokeless tobacco. She reports that she does not drink alcohol or use drugs.   Family History:  The patient's family history includes Breast cancer in her sister; Cancer in her sister; Heart disease in her father; Hypertension in her father.    ROS:  Please see the history of present illness. Otherwise, review of systems are positive for none.   All other systems are reviewed and negative.    PHYSICAL EXAM: VS:  BP (!) 128/54   Pulse 68   Ht 5\' 1"  (1.549 m)   Wt 268 lb (121.6 kg)   SpO2 97%   BMI 50.64 kg/m  , BMI Body mass index is 50.64 kg/m.   General: Well developed, well nourished, NAD Skin: Warm, dry, intact  Head: Normocephalic, atraumatic, sclera non-icteric, no xanthomas, clear, moist mucus membranes. Neck: Negative for carotid bruits. No JVD Lungs:Clear to ausculation bilaterally. No wheezes, rales, or rhonchi. Breathing is unlabored. Cardiovascular: RRR with S1 S2. No murmurs, rubs, gallops, or LV heave appreciated. Abdomen: Soft, non-tender, non-distended with normoactive bowel sounds. No hepatomegaly, No rebound/guarding. No obvious abdominal masses. MSK: Strength and tone appear normal for age. 5/5 in all extremities Extremities: No edema. No clubbing or cyanosis. DP/PT pulses 2+ bilaterally Neuro: Alert and oriented. No focal deficits. No  facial asymmetry. MAE spontaneously. Psych: Responds to questions appropriately with normal affect.     EKG:  EKG is ordered today. The ekg ordered today demonstrates NSR with HR, 68 bpm and no significant change   Recent Labs: No results found for requested labs within last 8760 hours.    Lipid Panel    Component Value Date/Time   CHOL 207 (H) 04/06/2013 0459   TRIG 186 (H) 04/06/2013 0459   HDL 58 04/06/2013 0459   CHOLHDL 3.6 04/06/2013 0459   VLDL 37 04/06/2013 0459   LDLCALC 112 (H) 04/06/2013 0459    Wt Readings  from Last 3 Encounters:  06/16/19 268 lb (121.6 kg)  07/08/18 271 lb 12.8 oz (123.3 kg)  06/11/18 270 lb 3.2 oz (122.6 kg)     Other studies Reviewed: Additional studies/ records that were reviewed today include:   Echocardiogram 05/26/2016:  Study Conclusions  - Left ventricle: The cavity size was normal. Systolic function was   normal. The estimated ejection fraction was in the range of 60%   to 65%. Wall motion was normal; there were no regional wall   motion abnormalities. Features are consistent with a pseudonormal   left ventricular filling pattern, with concomitant abnormal   relaxation and increased filling pressure (grade 2 diastolic   dysfunction). Doppler parameters are consistent with high   ventricular filling pressure. - Aortic valve: Transvalvular velocity was within the normal range.   There was no stenosis. There was no regurgitation. - Mitral valve: Transvalvular velocity was within the normal range.   There was no evidence for stenosis. There was mild regurgitation.   Valve area by pressure half-time: 1.79 cm^2. Valve area by   continuity equation (using LVOT flow): 1.1 cm^2. - Left atrium: The atrium was severely dilated. - Right ventricle: The cavity size was normal. Wall thickness was   normal. Systolic function was normal. - Tricuspid valve: There was trivial regurgitation. - Pulmonary arteries: Systolic pressure was within the  normal   range. PA peak pressure: 25 mm Hg (S).   ASSESSMENT AND PLAN:  1. Paroxysmal atrial fibrillation: -EKG with NSR, HR 68 bpm -Continue amiodarone 200mg  QD, Diltiazem 120mg  QD with BID dosing for episodes of AF -Continue Coumadin for AC>>>will obtain CBC today -For Coumadin INR check today as well  2. HTN: -Stable, 128/54 -Continue current regimen   3. Diastolic HF: -Last echocardiogram with LVEF of 60-65% with G2DD -Weight stable with no s/s of fluid volume overload -Recently contacted the office for weight gain in which her Lasix was increased from 40>>80 x3 days>>> will reports no significant change -Weight is actually down 3 pounds from last office visit. -Will obtain BMET today   4. DM2: -Management per PCP  5.  Possible TIA: -Seen by PCP for what sounds like a TIA with no recurrent episodes -Carotid Dopplers performed with no results in epic at this time -Head CT along with an echocardiogram were also ordered per patient report and have not yet been performed -Discussed having results sent to Dr. Olin Pia office for review -We discussed CV risk reduction including weight management, blood pressure, DM control  6.  Fatigue: -Patient reports fatigue began approximately 4 years ago after prolonged hospitalization and she has not been same since this time -Likely in the setting of advancing age and comorbid conditions however will review echocardiogram results from upcoming echo when available -No real concern for cardiovascular etiology given stable weight, no chest pain and no HF symptoms -We will obtain TSH, CBC and BMET today    Current medicines are reviewed at length with the patient today.  The patient does not have concerns regarding medicines.  The following changes have been made:  no change  Labs/ tests ordered today include: CBC, TSH, BMET  No orders of the defined types were placed in this encounter.    Disposition:   FU with Dr. Caryl Comes in 6 months   Signed, Kathyrn Drown, NP  06/16/2019 10:14 AM    [address], [place], [place]  [address] Phone: [phone]; Fax: [phone]

## 2019-06-16 ENCOUNTER — Other Ambulatory Visit: Payer: Self-pay

## 2019-06-16 ENCOUNTER — Encounter: Payer: Self-pay | Admitting: Cardiology

## 2019-06-16 ENCOUNTER — Ambulatory Visit (INDEPENDENT_AMBULATORY_CARE_PROVIDER_SITE_OTHER): Payer: Medicare Other | Admitting: Pharmacist

## 2019-06-16 ENCOUNTER — Encounter (INDEPENDENT_AMBULATORY_CARE_PROVIDER_SITE_OTHER): Payer: Self-pay

## 2019-06-16 ENCOUNTER — Ambulatory Visit (INDEPENDENT_AMBULATORY_CARE_PROVIDER_SITE_OTHER): Payer: Medicare Other | Admitting: Cardiology

## 2019-06-16 VITALS — BP 128/54 | HR 68 | Ht 61.0 in | Wt 268.0 lb

## 2019-06-16 DIAGNOSIS — Z5181 Encounter for therapeutic drug level monitoring: Secondary | ICD-10-CM

## 2019-06-16 DIAGNOSIS — I4891 Unspecified atrial fibrillation: Secondary | ICD-10-CM

## 2019-06-16 DIAGNOSIS — R5383 Other fatigue: Secondary | ICD-10-CM

## 2019-06-16 DIAGNOSIS — I10 Essential (primary) hypertension: Secondary | ICD-10-CM | POA: Diagnosis not present

## 2019-06-16 LAB — BASIC METABOLIC PANEL
BUN/Creatinine Ratio: 14 (ref 12–28)
BUN: 12 mg/dL (ref 8–27)
CO2: 27 mmol/L (ref 20–29)
Calcium: 9 mg/dL (ref 8.7–10.3)
Chloride: 100 mmol/L (ref 96–106)
Creatinine, Ser: 0.85 mg/dL (ref 0.57–1.00)
GFR calc Af Amer: 78 mL/min/{1.73_m2} (ref >59)
GFR calc non Af Amer: 67 mL/min/{1.73_m2} (ref >59)
Glucose: 196 mg/dL — ABNORMAL HIGH (ref 65–99)
Potassium: 3.8 mmol/L (ref 3.5–5.2)
Sodium: 144 mmol/L (ref 134–144)

## 2019-06-16 LAB — CBC
Hematocrit: 39.7 % (ref 34.0–46.6)
Hemoglobin: 12.4 g/dL (ref 11.1–15.9)
MCH: 27.1 pg (ref 26.6–33.0)
MCHC: 31.2 g/dL — ABNORMAL LOW (ref 31.5–35.7)
MCV: 87 fL (ref 79–97)
Platelets: 324 10*3/uL (ref 150–450)
RBC: 4.57 x10E6/uL (ref 3.77–5.28)
RDW: 14.7 % (ref 11.7–15.4)
WBC: 13.6 10*3/uL — ABNORMAL HIGH (ref 3.4–10.8)

## 2019-06-16 LAB — POCT INR: INR: 3.3 — AB (ref 2.0–3.0)

## 2019-06-16 LAB — TSH: TSH: 0.95 u[IU]/mL (ref 0.450–4.500)

## 2019-06-16 NOTE — Patient Instructions (Signed)
Description   Skip your Coumadin today, then continue taking 1 tablet everyday except 1/2 tablet on Mondays, Wednesdays and Fridays.  Recheck INR in 3 weeks.  Call with any new medications of if scheduled for any procedures [phone]

## 2019-06-16 NOTE — Patient Instructions (Signed)
Medication Instructions:  Your physician recommends that you continue on your current medications as directed. Please refer to the Current Medication list given to you today.  If you need a refill on your cardiac medications before your next appointment, please call your pharmacy.   Lab work: TODAY: BMET, CBC, TSH   If you have labs (blood work) drawn today and your tests are completely normal, you will receive your results only by: Marland Kitchen MyChart Message (if you have MyChart) OR . A paper copy in the mail If you have any lab test that is abnormal or we need to change your treatment, we will call you to review the results.  Testing/Procedures: NONE  Follow-Up: At [facility], you and your health needs are our priority.  As part of our continuing mission to provide you with exceptional heart care, we have created designated Provider Care Teams.  These Care Teams include your primary Cardiologist (physician) and Advanced Practice Providers (APPs -  Physician Assistants and Nurse Practitioners) who all work together to provide you with the care you need, when you need it. You will need a follow up appointment in 6 months.  Please call our office 2 months in advance to schedule this appointment.  You may see [name], MD or one of the following Advanced Practice Providers on your designated Care Team:   Chanetta Marshall, NP . Tommye Standard, PA-C  Any Other Special Instructions Will Be Listed Below (If Applicable).

## 2019-06-19 ENCOUNTER — Telehealth: Payer: Self-pay | Admitting: Medical

## 2019-06-19 NOTE — Telephone Encounter (Signed)
   Notified by [employer], Vicky, that patient reported weight gain of 3lbs from yesterday and some SOB during a routine call today. Spoke with the patient who reported feeling SOB yesterday evening and weight gain from 261lbs>264lbs today. Some mild increase in LE edema but she reports she was on her feet quite a bit yesterday. No complaints of chest pain, SOB at rest, palpitations, or syncope. She denies dietary indiscretion. Will plan to increase lasix to 80mg  daily for 3 days, then resume 40mg  daily dosing. Patient was in agreement with the plan and will notify the office if her symptoms persist despite additional diuresis.   Warrene Butts, PA-C 06/19/19; 11:59 AM

## 2019-06-21 DIAGNOSIS — R202 Paresthesia of skin: Secondary | ICD-10-CM | POA: Insufficient documentation

## 2019-06-24 ENCOUNTER — Telehealth: Payer: Self-pay | Admitting: Internal Medicine

## 2019-06-24 NOTE — Telephone Encounter (Signed)
New message   Patient is calling to get lab work results. Please call.

## 2019-06-29 NOTE — Telephone Encounter (Signed)
Please let the patient know that her lab work is stable.  She does appear to have chronic elevated white blood cell count in which she may want to follow-up with her primary care provider.  Her glucose was a little high however this could have been after eating and not fasting.  Her TSH was normal.  Kathyrn Drown NP-C [facility] Pager: [phone]

## 2019-06-29 NOTE — Telephone Encounter (Signed)
I spoke to the patient and gave her lab results/recommendations per Kathyrn Drown.  She verbalized understanding.

## 2019-07-08 ENCOUNTER — Telehealth: Payer: Self-pay | Admitting: Pharmacist

## 2019-07-08 NOTE — Telephone Encounter (Signed)
Patient called stating she was started on Cipro 250mg  BID for 5 days. Patient has INR appointment already scheduled for Monday. Advised to keep this appointment.

## 2019-07-11 ENCOUNTER — Ambulatory Visit (INDEPENDENT_AMBULATORY_CARE_PROVIDER_SITE_OTHER): Payer: Medicare Other | Admitting: *Deleted

## 2019-07-11 ENCOUNTER — Other Ambulatory Visit: Payer: Self-pay

## 2019-07-11 DIAGNOSIS — I4891 Unspecified atrial fibrillation: Secondary | ICD-10-CM

## 2019-07-11 DIAGNOSIS — Z5181 Encounter for therapeutic drug level monitoring: Secondary | ICD-10-CM

## 2019-07-11 LAB — POCT INR: INR: 2 (ref 2.0–3.0)

## 2019-07-11 NOTE — Patient Instructions (Signed)
Description   Continue taking 1 tablet everyday except 1/2 tablet on Mondays, Wednesdays and Fridays.  Recheck INR in 4 weeks. Call with any new medications of if scheduled for any procedures [phone]    

## 2019-07-25 ENCOUNTER — Telehealth: Payer: Self-pay | Admitting: *Deleted

## 2019-07-25 NOTE — Telephone Encounter (Signed)
Received a voicemail from the pt and and she stated she forgot to take last nights dose of Warfarin. Pt takes her dose at 10pm and advised she has 12 hours to take any missed dose and she verbalized understanding. Since it is more than 12 hours advised her to take an extra half of a tablet today, which equals 1 tablet then resume taking her normal dose.

## 2019-07-26 ENCOUNTER — Telehealth: Payer: Self-pay | Admitting: Internal Medicine

## 2019-07-26 NOTE — Telephone Encounter (Signed)
I spoke to the patient regarding her BP.  It has been elevated lately and she is experiencing headaches.  Her PCP has been assisting with this, but she cannot reach them.     I told her to take her morning medications at 8 am on 10/7 and check BP 2 hours later.  She will call us with BP and HR recording.

## 2019-07-26 NOTE — Telephone Encounter (Signed)
° ° °  Pt c/o BP issue: STAT if pt c/o blurred vision, one-sided weakness or slurred speech  1. What are your last 5 BP readings? 181/76, 197/82, 180/64, 165/66  2. Are you having any other symptoms (ex. Dizziness, headache, blurred vision, passed out)? headache  3. What is your BP issue? Call from [facility] , with the patient on the phone as well to report high BP. Please contact patient

## 2019-07-26 NOTE — Telephone Encounter (Signed)
I left a message for Mallory [phone] # [phone] from [facility] and told her to reach out to the patient's PCP for further advisement on BP.

## 2019-07-26 NOTE — Telephone Encounter (Signed)
Follow up ° ° °Patient is returning your call. Please call. ° ° ° °

## 2019-07-26 NOTE — Telephone Encounter (Signed)
New Message    Mallory from [facility] and patient calling in to speak to you again about patient's BP issues.  Please call them back they will be waiting for your call back as stated.

## 2019-07-26 NOTE — Telephone Encounter (Signed)
I called the patient and left her a message to reach out to [facility] from [facility] to contact Dr Mellody Drown for further BP advisement.

## 2019-07-27 ENCOUNTER — Telehealth: Payer: Self-pay | Admitting: *Deleted

## 2019-07-27 NOTE — Telephone Encounter (Signed)
Pt called and stated that she started a new medicine Doxazosin 2mg  for her blood pressure. Per Up to Date Doxazosin and Coumadin have a low risk interaction.  Informed pt that we will continue to monitor her INR and to keep appointment 10/19 to get her INR checked.

## 2019-07-29 ENCOUNTER — Other Ambulatory Visit: Payer: Self-pay | Admitting: *Deleted

## 2019-07-29 MED ORDER — WARFARIN SODIUM 5 MG PO TABS: ORAL_TABLET | ORAL | 0 refills | Status: DC

## 2019-08-08 ENCOUNTER — Ambulatory Visit (INDEPENDENT_AMBULATORY_CARE_PROVIDER_SITE_OTHER): Payer: Medicare Other | Admitting: Pharmacist

## 2019-08-08 ENCOUNTER — Other Ambulatory Visit: Payer: Self-pay

## 2019-08-08 DIAGNOSIS — I4891 Unspecified atrial fibrillation: Secondary | ICD-10-CM

## 2019-08-08 DIAGNOSIS — Z5181 Encounter for therapeutic drug level monitoring: Secondary | ICD-10-CM | POA: Diagnosis not present

## 2019-08-08 LAB — POCT INR: INR: 2.7 (ref 2.0–3.0)

## 2019-08-08 MED ORDER — WARFARIN SODIUM 5 MG PO TABS: ORAL_TABLET | ORAL | 0 refills | Status: DC

## 2019-08-08 NOTE — Patient Instructions (Signed)
Description   Continue taking 1 tablet everyday except 1/2 tablet on Mondays, Wednesdays and Fridays.  Recheck INR 11/11, patient is going out of town for 3 weeks on 11/12.  Call with any new medications of if scheduled for any procedures [phone]

## 2019-08-10 ENCOUNTER — Telehealth: Payer: Self-pay | Admitting: Internal Medicine

## 2019-08-10 MED ORDER — TORSEMIDE 20 MG PO TABS: 20.0000 mg | ORAL_TABLET | Freq: Every day | ORAL | 2 refills | Status: DC

## 2019-08-10 NOTE — Telephone Encounter (Signed)
Pt c/o swelling: STAT is pt has developed SOB within 24 hours  1) How much weight have you gained and in what time span? 1 1/2 lbs in about 4 days   2) If swelling, where is the swelling located? Swelling on her feet for the last 4 days  3) Are you currently taking a fluid pill? Yes   4) Are you currently SOB? No   5) Do you have a log of your daily weights (if so, list)? no  6) Have you gained 3 pounds in a day or 5 pounds in a week? no  7) Have you traveled recently? No  She states she just gets tired easily.  She stated this has happened before and was advised to take and extra 1/2 a pill of her water pill.  She wants to know if she should do that again.

## 2019-08-10 NOTE — Telephone Encounter (Signed)
[name] try her on torsemide 40 daily x 3 days and then 20 mg  The Key will be knowing if the torsemide make her urinate briskly or not so as to know if that is the right dosing  Thanks SK

## 2019-08-10 NOTE — Telephone Encounter (Signed)
I spoke with pt. She reports weight today is 262 lbs.  Yesterday and the day before wt was 261 lbs.  She is not currently home and does not have other weights available. Reports BP is OK. She has bilateral ankle swelling. Left greater than right. This is present in the AM when she gets up.  She does keep legs elevated when possible and states this helps some. Watches salt intake and no recent change in diet or increased salt consumption. No change in shortness of breath.  Reports she has been tired since hospitalization 4 years ago. She states last time she had weight gain she was instructed to take extra lasix.  Per Sharee Pimple McDaniel's last office note the extra lasix did not help with weight gain so I have advised her not to do this until we check with Dr Caryl Comes.  Will forward to Dr Caryl Comes for recommendations.

## 2019-08-10 NOTE — Telephone Encounter (Signed)
I spoke with pt. She will stop furosemide and start torsemide.  Will send prescription to [facility] on [address].  I asked pt to weigh herself daily and call us in 7 days with update on swelling and weight.

## 2019-08-23 DIAGNOSIS — R519 Headache, unspecified: Secondary | ICD-10-CM | POA: Insufficient documentation

## 2019-08-23 DIAGNOSIS — R200 Anesthesia of skin: Secondary | ICD-10-CM | POA: Insufficient documentation

## 2019-08-31 ENCOUNTER — Ambulatory Visit (INDEPENDENT_AMBULATORY_CARE_PROVIDER_SITE_OTHER): Payer: Medicare Other | Admitting: *Deleted

## 2019-08-31 ENCOUNTER — Other Ambulatory Visit: Payer: Self-pay

## 2019-08-31 DIAGNOSIS — Z5181 Encounter for therapeutic drug level monitoring: Secondary | ICD-10-CM

## 2019-08-31 DIAGNOSIS — I4891 Unspecified atrial fibrillation: Secondary | ICD-10-CM | POA: Diagnosis not present

## 2019-08-31 LAB — POCT INR: INR: 2.6 (ref 2.0–3.0)

## 2019-08-31 MED ORDER — WARFARIN SODIUM 5 MG PO TABS: ORAL_TABLET | ORAL | 1 refills | Status: DC

## 2019-08-31 NOTE — Patient Instructions (Signed)
Description   Continue taking 1 tablet everyday except 1/2 tablet on Mondays, Wednesdays and Fridays.  Recheck INR in 5 weeks. Call with any new medications of if scheduled for any procedures [phone]

## 2019-09-05 ENCOUNTER — Encounter: Payer: Self-pay | Admitting: Internal Medicine

## 2019-09-05 NOTE — Telephone Encounter (Signed)
No note needed 

## 2019-09-23 DIAGNOSIS — H9313 Tinnitus, bilateral: Secondary | ICD-10-CM | POA: Insufficient documentation

## 2019-09-26 ENCOUNTER — Telehealth: Payer: Self-pay | Admitting: Internal Medicine

## 2019-09-26 NOTE — Telephone Encounter (Signed)
Spoke with pt and on the way to church yesterday Pt had episode of SOB and EMS was called  Per pt EKG looked okay but B/P was elevated at 179/103 and HR in the 60's EMS wanted to take pt to hospital pt refused and went ahead on to church without further incident Pt noted another episode this am but not quite as severe per pt has not increased salt intake in diet . Pt requesting an appt Appt made for 09/29/19 at 9:30 am with Dr Caryl Comes. Will forward to Dr Caryl Comes for review./cy

## 2019-09-26 NOTE — Telephone Encounter (Signed)
New Message   Pt c/o Shortness Of Breath: STAT if SOB developed within the last 24 hours or pt is noticeably SOB on the phone  1. Are you currently SOB (can you hear that pt is SOB on the phone)? no  2. How long have you been experiencing SOB? 1st episode occurred yesterday and this morning she had another episode of sob  3. Are you SOB when sitting or when up moving around? Moving around  4. Are you currently experiencing any other symptoms? Patient states that her BP this morning was 189/103   Patient states that on yesterday on her way to church she experienced and episode of sob and they called the ambulance. EMT wanted her to go to the ED but she did not go.

## 2019-09-29 ENCOUNTER — Encounter (INDEPENDENT_AMBULATORY_CARE_PROVIDER_SITE_OTHER): Payer: Self-pay

## 2019-09-29 ENCOUNTER — Other Ambulatory Visit: Payer: Self-pay

## 2019-09-29 ENCOUNTER — Ambulatory Visit (INDEPENDENT_AMBULATORY_CARE_PROVIDER_SITE_OTHER): Payer: Medicare Other | Admitting: Internal Medicine

## 2019-09-29 ENCOUNTER — Encounter: Payer: Self-pay | Admitting: Internal Medicine

## 2019-09-29 VITALS — BP 160/68 | HR 68 | Ht 61.0 in | Wt 256.4 lb

## 2019-09-29 DIAGNOSIS — I480 Paroxysmal atrial fibrillation: Secondary | ICD-10-CM

## 2019-09-29 DIAGNOSIS — I5032 Chronic diastolic (congestive) heart failure: Secondary | ICD-10-CM | POA: Diagnosis not present

## 2019-09-29 DIAGNOSIS — I10 Essential (primary) hypertension: Secondary | ICD-10-CM

## 2019-09-29 MED ORDER — TORSEMIDE 20 MG PO TABS: 40.0000 mg | ORAL_TABLET | Freq: Every day | ORAL | 3 refills | Status: DC

## 2019-09-29 NOTE — Progress Notes (Signed)
Patient Care Team: Lauraine Rinne, MD as PCP - General (Family Medicine) [name], MD as PCP - Electrophysiology (Cardiology) Shawnee Knapp, MD as Resident (Family Medicine)   HPI  [name] is a 78 y.o. female seen in followup for atrial arrhythmias/fibrillation associated with diastolic heart failure. She's been tried on flecainide, dofetilide, and most recently has been on amiodarone.    She has had recurrent paroxysms of atrial fibrillation; seen in the A. fib clinic and started on diltiazem..   She has been seen for congestive heart failure.  Telephone interventions over recent months including changing furosemide--torsemide.  Improved but in last weeks more SOB, and called ambulance Sunday--but got better and didn't go No edema but considerable abd distension and bendopnea; sleeps. 45 degrees No chest pain  No salt   DATE TEST EF   11/12 Echo   60-65 %   11/15 Echo   60-65 % MR mild-mod  8/17 Echo  60-65% LAE (47/2.1/38)   Date Cr K Hgb TSH LFTs PFTs  8/17    2.444    3/18    0.175    4/19  0.93 4.0 13.3   17 106%  6/19 0.75 4.1      8/20 0.85 3.8 12.4 0.95             Past Medical History:  Diagnosis Date  . Arthritis   . Back pain   . Chronic anticoagulation    due to aflutter  . Chronic kidney disease   . Diabetes mellitus   . Diastolic CHF, chronic ([place])    a.  echo 2006 - ef 55-65%; mild diast dysfxn;    b. Echo 08/2011: Mild LVH, EF 60%;  c. 04/2013 Echo: EF 65-69%, mild conc LVH;  08/2014 Echo: EF 60-65%, mild-mod MR.  . Gout   . Hyperlipidemia   . Hypertension    a.  Renal arterial Dopplers 12/2011: 1-59% right renal artery stenosis  . Morbid obesity ([place])   . Obstructive sleep apnea on CPAP   . Paroxysmal Afib/Flutter    a. 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 APPENDECTOMY    . ATRIAL FLUTTER ABLATION N/A 09/24/2011   Procedure: ATRIAL FLUTTER ABLATION;   Surgeon: [name], MD;  Location: [facility] CATH LAB;  Service: Cardiovascular;  Laterality: N/A;  . CARDIOVERSION  10/22/2011   Procedure: CARDIOVERSION;  Surgeon: [name], MD;  Location: [place];  Service: Cardiovascular;  Laterality: N/A;  . CARDIOVERSION N/A 09/10/2011   Procedure: CARDIOVERSION;  Surgeon: [name], MD;  Location: [facility] CATH LAB;  Service: Cardiovascular;  Laterality: N/A;  . CHOLECYSTECTOMY    . TONSILLECTOMY  1982  . TOTAL ABDOMINAL HYSTERECTOMY      Current Outpatient Medications  Medication Sig Dispense Refill  . allopurinol (ZYLOPRIM) 300 MG tablet Take 300 mg by mouth daily.     Marland Kitchen amiodarone (PACERONE) 200 MG tablet TAKE 1 TABLET BY MOUTH DAILY. MONDAY THROUGH FRIDAY ONLY 90 tablet 3  . cloNIDine (CATAPRES) 0.2 MG tablet Take 0.2 mg by mouth 2 (two) times daily.    Marland Kitchen diltiazem (CARDIZEM CD) 120 MG 24 hr capsule Take one capsule daily; may take twice a day during afib episode 120 capsule 0  . Insulin Lispro Prot & Lispro (HUMALOG 75/25 MIX) (75-25) 100 UNIT/ML Kwikpen Inject insulin according to sliding scale maximum dose 56. Before breakfast and before dinner    .  levothyroxine (SYNTHROID, LEVOTHROID) 75 MCG tablet Take 75 mcg daily before breakfast by mouth.     Marland Kitchen lisinopril (PRINIVIL,ZESTRIL) 20 MG tablet Take 1 tablet (20 mg total) by mouth daily. 30 tablet 11  . rosuvastatin (CRESTOR) 10 MG tablet Take 10 mg by mouth every evening.    . Semaglutide,0.25 or 0.5MG /DOS, (OZEMPIC, 0.25 OR 0.5 MG/DOSE,) 2 MG/1.5ML SOPN Inject 0.4 mLs into the skin once a week.    . torsemide (DEMADEX) 20 MG tablet Take 1 tablet (20 mg total) by mouth daily. 90 tablet 2  . warfarin (COUMADIN) 5 MG tablet Take 1 tablet daily except 0.5 tablets on Monday, Wednesday and Friday or as directed by the coumadin clinic 90 tablet 1   No current facility-administered medications for this visit.    Allergies  Allergen Reactions  . Metformin Diarrhea    Review of Systems negative  except from HPI and PMH  Physical Exam BP (!) 160/68   Pulse 68   Ht 5\' 1"  (1.549 m)   Wt 256 lb 6.4 oz (116.3 kg)   BMI 48.45 kg/m  Well developed and Morbidly obese   in no acute distress HENT normal Neck supple with JVP-8 +HJR Clear Regular rate and rhythm, no murmurs or gallops Abd-soft with active BS No Clubbing cyanosis edema Skin-warm and dry A & Oriented  Grossly normal sensory and motor function    ECG sinus @ 68 19/08/44  qwave V1-V3 not previously noted   Repeat  Anterior QRS normal Assessment and  Plan  Hypertension   Persistent atrial fibrillation   HFpEF acute chronic  High Risk Medication Surveillance  Morbidly obese   No intercurrent atrial fibrillation or flutter  On Anticoagulation;  No bleeding issues   colume overloaded with R sided swelling  Will increase torsemide--  Will check BNP and recheck BMP in 2 weeks   BP well controlled

## 2019-09-29 NOTE — Patient Instructions (Signed)
Medication Instructions:  Increase your Torsemide to 40mg , 2 tablets, once daily.  Labwork: 10/17/2019 BMET and BNP  Testing/Procedures: None ordered.   Follow-Up: Your physician recommends that you schedule a follow-up appointment in: 6 months with Dr Caryl Comes   Any Other Special Instructions Will Be Listed Below (If Applicable).     If you need a refill on your cardiac medications before your next appointment, please call your pharmacy.

## 2019-09-30 NOTE — Telephone Encounter (Signed)
Seen yday

## 2019-10-05 ENCOUNTER — Other Ambulatory Visit: Payer: Self-pay

## 2019-10-07 ENCOUNTER — Ambulatory Visit (INDEPENDENT_AMBULATORY_CARE_PROVIDER_SITE_OTHER): Payer: Medicare Other | Admitting: *Deleted

## 2019-10-07 ENCOUNTER — Other Ambulatory Visit: Payer: Self-pay

## 2019-10-07 DIAGNOSIS — Z5181 Encounter for therapeutic drug level monitoring: Secondary | ICD-10-CM

## 2019-10-07 DIAGNOSIS — I4891 Unspecified atrial fibrillation: Secondary | ICD-10-CM

## 2019-10-07 LAB — POCT INR: INR: 3.6 — AB (ref 2.0–3.0)

## 2019-10-07 NOTE — Patient Instructions (Signed)
Description    Hold today, then continue taking 1 tablet everyday except 1/2 tablet on Mondays, Wednesdays and Fridays.  Recheck INR in 3 weeks. Call with any new medications of if scheduled for any procedures [phone]

## 2019-10-17 ENCOUNTER — Other Ambulatory Visit: Payer: Medicare Other | Admitting: *Deleted

## 2019-10-17 ENCOUNTER — Other Ambulatory Visit: Payer: Self-pay

## 2019-10-17 DIAGNOSIS — I480 Paroxysmal atrial fibrillation: Secondary | ICD-10-CM

## 2019-10-17 DIAGNOSIS — I10 Essential (primary) hypertension: Secondary | ICD-10-CM

## 2019-10-17 DIAGNOSIS — I5032 Chronic diastolic (congestive) heart failure: Secondary | ICD-10-CM

## 2019-10-17 MED ORDER — DILTIAZEM HCL ER COATED BEADS 120 MG PO CP24: ORAL_CAPSULE | ORAL | 2 refills | Status: DC

## 2019-10-18 ENCOUNTER — Telehealth: Payer: Self-pay

## 2019-10-18 LAB — BASIC METABOLIC PANEL
BUN/Creatinine Ratio: 19 (ref 12–28)
BUN: 17 mg/dL (ref 8–27)
CO2: 30 mmol/L — ABNORMAL HIGH (ref 20–29)
Calcium: 8.9 mg/dL (ref 8.7–10.3)
Chloride: 98 mmol/L (ref 96–106)
Creatinine, Ser: 0.91 mg/dL (ref 0.57–1.00)
GFR calc Af Amer: 71 mL/min/{1.73_m2} (ref >59)
GFR calc non Af Amer: 61 mL/min/{1.73_m2} (ref >59)
Glucose: 218 mg/dL — ABNORMAL HIGH (ref 65–99)
Potassium: 3.3 mmol/L — ABNORMAL LOW (ref 3.5–5.2)
Sodium: 145 mmol/L — ABNORMAL HIGH (ref 134–144)

## 2019-10-18 LAB — PRO B NATRIURETIC PEPTIDE: NT-Pro BNP: 136 pg/mL (ref 0–738)

## 2019-10-18 MED ORDER — POTASSIUM CHLORIDE ER 10 MEQ PO TBCR: 20.0000 meq | EXTENDED_RELEASE_TABLET | Freq: Every day | ORAL | 3 refills | Status: DC

## 2019-10-18 NOTE — Telephone Encounter (Signed)
Spoke with pt and advised of low K+, per Dr Caryl Comes start KCL 70meq daily by mouth.  Pt verbalizes understanding and agrees with plan

## 2019-10-23 DIAGNOSIS — L304 Erythema intertrigo: Secondary | ICD-10-CM | POA: Insufficient documentation

## 2019-10-25 ENCOUNTER — Encounter (HOSPITAL_COMMUNITY): Payer: Self-pay | Admitting: Nurse Practitioner

## 2019-10-25 ENCOUNTER — Ambulatory Visit (HOSPITAL_COMMUNITY)
Admission: RE | Admit: 2019-10-25 | Discharge: 2019-10-25 | Disposition: A | Discharge status: Home / Self Care | Payer: Medicare Other | Source: Ambulatory Visit | Attending: Nurse Practitioner | Admitting: Nurse Practitioner

## 2019-10-25 ENCOUNTER — Other Ambulatory Visit: Payer: Self-pay

## 2019-10-25 VITALS — BP 150/70 | HR 60 | Ht 61.0 in | Wt 254.6 lb

## 2019-10-25 DIAGNOSIS — N189 Chronic kidney disease, unspecified: Secondary | ICD-10-CM | POA: Diagnosis not present

## 2019-10-25 DIAGNOSIS — I4819 Other persistent atrial fibrillation: Secondary | ICD-10-CM | POA: Diagnosis not present

## 2019-10-25 DIAGNOSIS — Z803 Family history of malignant neoplasm of breast: Secondary | ICD-10-CM | POA: Diagnosis not present

## 2019-10-25 DIAGNOSIS — Z8249 Family history of ischemic heart disease and other diseases of the circulatory system: Secondary | ICD-10-CM | POA: Diagnosis not present

## 2019-10-25 DIAGNOSIS — Z888 Allergy status to other drugs, medicaments and biological substances status: Secondary | ICD-10-CM | POA: Insufficient documentation

## 2019-10-25 DIAGNOSIS — Z6841 Body Mass Index (BMI) 40.0 and over, adult: Secondary | ICD-10-CM | POA: Insufficient documentation

## 2019-10-25 DIAGNOSIS — M1990 Unspecified osteoarthritis, unspecified site: Secondary | ICD-10-CM | POA: Diagnosis not present

## 2019-10-25 DIAGNOSIS — G4733 Obstructive sleep apnea (adult) (pediatric): Secondary | ICD-10-CM | POA: Diagnosis not present

## 2019-10-25 DIAGNOSIS — D6869 Other thrombophilia: Secondary | ICD-10-CM | POA: Diagnosis not present

## 2019-10-25 DIAGNOSIS — E785 Hyperlipidemia, unspecified: Secondary | ICD-10-CM | POA: Insufficient documentation

## 2019-10-25 DIAGNOSIS — I5032 Chronic diastolic (congestive) heart failure: Secondary | ICD-10-CM | POA: Insufficient documentation

## 2019-10-25 DIAGNOSIS — I130 Hypertensive heart and chronic kidney disease with heart failure and stage 1 through stage 4 chronic kidney disease, or unspecified chronic kidney disease: Secondary | ICD-10-CM | POA: Insufficient documentation

## 2019-10-25 DIAGNOSIS — I480 Paroxysmal atrial fibrillation: Secondary | ICD-10-CM | POA: Diagnosis not present

## 2019-10-25 DIAGNOSIS — Z794 Long term (current) use of insulin: Secondary | ICD-10-CM | POA: Diagnosis not present

## 2019-10-25 DIAGNOSIS — I4891 Unspecified atrial fibrillation: Secondary | ICD-10-CM | POA: Diagnosis present

## 2019-10-25 DIAGNOSIS — Z79899 Other long term (current) drug therapy: Secondary | ICD-10-CM | POA: Diagnosis not present

## 2019-10-25 DIAGNOSIS — Z7901 Long term (current) use of anticoagulants: Secondary | ICD-10-CM | POA: Insufficient documentation

## 2019-10-25 DIAGNOSIS — E1122 Type 2 diabetes mellitus with diabetic chronic kidney disease: Secondary | ICD-10-CM | POA: Diagnosis not present

## 2019-10-25 DIAGNOSIS — Z79890 Hormone replacement therapy (postmenopausal): Secondary | ICD-10-CM | POA: Insufficient documentation

## 2019-10-25 LAB — COMPREHENSIVE METABOLIC PANEL
ALT: 16 U/L (ref 0–44)
AST: 19 U/L (ref 15–41)
Albumin: 3.6 g/dL (ref 3.5–5.0)
Alkaline Phosphatase: 119 U/L (ref 38–126)
Anion gap: 13 (ref 5–15)
BUN: 16 mg/dL (ref 8–23)
CO2: 29 mmol/L (ref 22–32)
Calcium: 8.8 mg/dL — ABNORMAL LOW (ref 8.9–10.3)
Chloride: 98 mmol/L (ref 98–111)
Creatinine, Ser: 0.81 mg/dL (ref 0.44–1.00)
GFR calc Af Amer: >60 mL/min (ref >60)
GFR calc non Af Amer: >60 mL/min (ref >60)
Glucose, Bld: 156 mg/dL — ABNORMAL HIGH (ref 70–99)
Potassium: 3.7 mmol/L (ref 3.5–5.1)
Sodium: 140 mmol/L (ref 135–145)
Total Bilirubin: 0.6 mg/dL (ref 0.3–1.2)
Total Protein: 7.5 g/dL (ref 6.5–8.1)

## 2019-10-25 LAB — TSH: TSH: 1.579 u[IU]/mL (ref 0.350–4.500)

## 2019-10-25 NOTE — Progress Notes (Signed)
Primary Care Physician: Lauraine Rinne, MD Referring Physician:[facility] triage   [name] is a 79 y.o. female with a h/o paroxysmal afib previously on flecainide, dofetilide and most recently on amiodarone. She is being seen today in the afib clinic. She is in SR and does not report any significant sustained afib. Overall her health has been stable. She remains on amiodarone 200 mg M-F only. TSH/Cmet checked today. Remains on warfarin with CHA2DS2VASc score of at least 5. No bleeding issues.     Today, she denies symptoms of palpitations, chest pain, shortness of breath, orthopnea, PND, lower extremity edema, dizziness, presyncope, syncope, or neurologic sequela. The patient is tolerating medications without difficulties and is otherwise without complaint today.   Past Medical History:  Diagnosis Date  . Arthritis   . Back pain   . Chronic anticoagulation    due to aflutter  . Chronic kidney disease   . Diabetes mellitus   . Diastolic CHF, chronic ([place])    a.  echo 2006 - ef 55-65%; mild diast dysfxn;    b. Echo 08/2011: Mild LVH, EF 60%;  c. 04/2013 Echo: EF 65-69%, mild conc LVH;  08/2014 Echo: EF 60-65%, mild-mod MR.  . Gout   . Hyperlipidemia   . Hypertension    a.  Renal arterial Dopplers 12/2011: 1-59% right renal artery stenosis  . Morbid obesity ([place])   . Obstructive sleep apnea on CPAP   . Paroxysmal Afib/Flutter    a. 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   Past Surgical History:  Procedure Laterality Date  . APPENDECTOMY    . ATRIAL FLUTTER ABLATION N/A 09/24/2011   Procedure: ATRIAL FLUTTER ABLATION;  Surgeon: [name], MD;  Location: [facility] CATH LAB;  Service: Cardiovascular;  Laterality: N/A;  . CARDIOVERSION  10/22/2011   Procedure: CARDIOVERSION;  Surgeon: [name], MD;  Location: [place];  Service: Cardiovascular;  Laterality: N/A;  . CARDIOVERSION N/A 09/10/2011   Procedure: CARDIOVERSION;  Surgeon: [name], MD;  Location: [facility] CATH LAB;   Service: Cardiovascular;  Laterality: N/A;  . CHOLECYSTECTOMY    . TONSILLECTOMY  1982  . TOTAL ABDOMINAL HYSTERECTOMY      Current Outpatient Medications  Medication Sig Dispense Refill  . Accu-Chek Softclix Lancets lancets 3 (three) times daily. as directed    . allopurinol (ZYLOPRIM) 300 MG tablet Take 300 mg by mouth daily.     Marland Kitchen amiodarone (PACERONE) 200 MG tablet TAKE 1 TABLET BY MOUTH DAILY. MONDAY THROUGH FRIDAY ONLY 90 tablet 3  . cloNIDine (CATAPRES) 0.2 MG tablet Take 0.2 mg by mouth 2 (two) times daily.    Marland Kitchen diltiazem (CARDIZEM CD) 120 MG 24 hr capsule Take one capsule daily; may take twice a day during afib episode 120 capsule 2  . gabapentin (NEURONTIN) 300 MG capsule     . Insulin Lispro Prot & Lispro (HUMALOG 75/25 MIX) (75-25) 100 UNIT/ML Kwikpen Inject insulin according to sliding scale maximum dose 56. Before breakfast and before dinner    . isosorbide mononitrate (IMDUR) 30 MG 24 hr tablet Take 30 mg by mouth daily.    Marland Kitchen levothyroxine (SYNTHROID, LEVOTHROID) 75 MCG tablet Take 75 mcg daily before breakfast by mouth.     Marland Kitchen lisinopril (PRINIVIL,ZESTRIL) 20 MG tablet Take 1 tablet (20 mg total) by mouth daily. 30 tablet 11  . NOVOFINE PLUS 32G X 4 MM MISC 2 (two) times daily. as directed    . nystatin (MYCOSTATIN/NYSTOP) powder Apply to affect area twice a  day.    . potassium chloride (KLOR-CON) 10 MEQ tablet Take 2 tablets (20 mEq total) by mouth daily. 180 tablet 3  . rosuvastatin (CRESTOR) 10 MG tablet Take 10 mg by mouth every evening.    . Semaglutide,0.25 or 0.5MG /DOS, (OZEMPIC, 0.25 OR 0.5 MG/DOSE,) 2 MG/1.5ML SOPN Inject 0.4 mLs into the skin once a week.    . torsemide (DEMADEX) 20 MG tablet Take 2 tablets (40 mg total) by mouth daily. 180 tablet 3  . triamcinolone cream (KENALOG) 0.1 % APPLY EXTERNALLY TO RASH 2 TO 3 TIMES A DAY    . warfarin (COUMADIN) 5 MG tablet Take 1 tablet daily except 0.5 tablets on Monday, Wednesday and Friday or as directed by the coumadin  clinic 90 tablet 1   No current facility-administered medications for this encounter.    Allergies  Allergen Reactions  . Metformin Diarrhea    Social History   Socioeconomic History  . Marital status: Married    Spouse name: Not on file  . Number of children: Y  . Years of education: Not on file  . Highest education level: Not on file  Occupational History  . Occupation: DISABILITY/housewife    Employer: RETIRED  Tobacco Use  . Smoking status: Never Smoker  . Smokeless tobacco: Never Used  Substance and Sexual Activity  . Alcohol use: No  . Drug use: No  . Sexual activity: Yes  Other Topics Concern  . Not on file  Social History Narrative  . Not on file   Social Determinants of Health   Financial Resource Strain:   . Difficulty of Paying Living Expenses: Not on file  Food Insecurity:   . Worried About Charity fundraiser in the Last Year: Not on file  . Ran Out of Food in the Last Year: Not on file  Transportation Needs:   . Lack of Transportation (Medical): Not on file  . Lack of Transportation (Non-Medical): Not on file  Physical Activity:   . Days of Exercise per Week: Not on file  . Minutes of Exercise per Session: Not on file  Stress:   . Feeling of Stress : Not on file  Social Connections:   . Frequency of Communication with Friends and Family: Not on file  . Frequency of Social Gatherings with Friends and Family: Not on file  . Attends Religious Services: Not on file  . Active Member of Clubs or Organizations: Not on file  . Attends Archivist Meetings: Not on file  . Marital Status: Not on file  Intimate Partner Violence:   . Fear of Current or Ex-Partner: Not on file  . Emotionally Abused: Not on file  . Physically Abused: Not on file  . Sexually Abused: Not on file    Family History  Problem Relation Age of Onset  . Heart disease Father   . Hypertension Father   . Breast cancer Sister   . Cancer Sister        breast    ROS-  All systems are reviewed and negative except as per the HPI above  Physical Exam: Vitals:   10/25/19 0936  BP: (!) 150/70  Pulse: 60  Weight: 115.5 kg  Height: 5\' 1"  (1.549 m)   Wt Readings from Last 3 Encounters:  10/25/19 115.5 kg  09/29/19 116.3 kg  06/16/19 121.6 kg    Labs: Lab Results  Component Value Date   NA 140 10/25/2019   K 3.7 10/25/2019   CL 98 10/25/2019  CO2 29 10/25/2019   GLUCOSE 156 (H) 10/25/2019   BUN 16 10/25/2019   CREATININE 0.81 10/25/2019   CALCIUM 8.8 (L) 10/25/2019   MG 1.9 08/03/2015   Lab Results  Component Value Date   INR 3.6 (A) 10/07/2019   Lab Results  Component Value Date   CHOL 207 (H) 04/06/2013   HDL 58 04/06/2013   LDLCALC 112 (H) 04/06/2013   TRIG 186 (H) 04/06/2013     GEN- The patient is well appearing, alert and oriented x 3 today.   Head- normocephalic, atraumatic Eyes-  Sclera clear, conjunctiva pink Ears- hearing intact Oropharynx- clear Neck- supple, no JVP Lymph- no cervical lymphadenopathy Lungs- Clear to ausculation bilaterally, normal work of breathing Heart-  regular rate and rhythm, no murmurs, rubs or gallops, PMI not laterally displaced GI- soft, NT, ND, + BS Extremities- no clubbing, cyanosis, or edema MS- no significant deformity or atrophy Skin- no rash or lesion Psych- euthymic mood, full affect Neuro- strength and sensation are intact  EKG-Sinus brady at 57 bpm, pr int 218 ms, qrs int 80 ms, qtc 461 ms Epic records reviewed Echo-Study Conclusions  - Left ventricle: The cavity size was normal. Systolic function was   normal. The estimated ejection fraction was in the range of 60%   to 65%. Wall motion was normal; there were no regional wall   motion abnormalities. Features are consistent with a pseudonormal   left ventricular filling pattern, with concomitant abnormal   relaxation and increased filling pressure (grade 2 diastolic   dysfunction). Doppler parameters are consistent with  high   ventricular filling pressure. - Aortic valve: Transvalvular velocity was within the normal range.   There was no stenosis. There was no regurgitation. - Mitral valve: Transvalvular velocity was within the normal range.   There was no evidence for stenosis. There was mild regurgitation.   Valve area by pressure half-time: 1.79 cm^2. Valve area by   continuity equation (using LVOT flow): 1.1 cm^2. - Left atrium: The atrium was severely dilated. - Right ventricle: The cavity size was normal. Wall thickness was   normal. Systolic function was normal. - Tricuspid valve: There was trivial regurgitation. - Pulmonary arteries: Systolic pressure was within the normal   range. PA peak pressure: 25 mm Hg (S).    Assessment and Plan: 1. Persistent afib Continue amiodarone at 200 mg M-F TSH normal limits/cmet WNL checked today    2. Chadsvasc score of at least 5 Continue warfarin    F/u with Ivar Drape per recall  afib clinic as needed   Geroge Baseman. Atom Solivan, [facility] [address] [place], [place] [address] [phone]

## 2019-10-27 ENCOUNTER — Ambulatory Visit (INDEPENDENT_AMBULATORY_CARE_PROVIDER_SITE_OTHER): Payer: Medicare Other | Admitting: *Deleted

## 2019-10-27 ENCOUNTER — Other Ambulatory Visit: Payer: Self-pay

## 2019-10-27 DIAGNOSIS — I4891 Unspecified atrial fibrillation: Secondary | ICD-10-CM | POA: Diagnosis not present

## 2019-10-27 DIAGNOSIS — Z5181 Encounter for therapeutic drug level monitoring: Secondary | ICD-10-CM

## 2019-10-27 LAB — POCT INR: INR: 2.5 (ref 2.0–3.0)

## 2019-10-27 NOTE — Patient Instructions (Addendum)
Description   Continue taking 1 tablet everyday except 1/2 tablet on Mondays, Wednesdays and Fridays.  Recheck INR in 4 weeks. Call with any new medications of if scheduled for any procedures [phone]

## 2019-11-15 ENCOUNTER — Other Ambulatory Visit: Payer: Self-pay | Admitting: Internal Medicine

## 2019-11-28 ENCOUNTER — Ambulatory Visit (INDEPENDENT_AMBULATORY_CARE_PROVIDER_SITE_OTHER): Payer: Medicare Other | Admitting: *Deleted

## 2019-11-28 ENCOUNTER — Other Ambulatory Visit: Payer: Self-pay

## 2019-11-28 DIAGNOSIS — Z5181 Encounter for therapeutic drug level monitoring: Secondary | ICD-10-CM | POA: Diagnosis not present

## 2019-11-28 DIAGNOSIS — I4891 Unspecified atrial fibrillation: Secondary | ICD-10-CM | POA: Diagnosis not present

## 2019-11-28 LAB — POCT INR: INR: 3.8 — AB (ref 2.0–3.0)

## 2019-11-28 NOTE — Patient Instructions (Signed)
Description   Hold today, then continue taking 1 tablet everyday except 1/2 tablet on Mondays, Wednesdays and Fridays.  Recheck INR in 3 weeks. Call with any new medications of if scheduled for any procedures [phone]

## 2019-12-19 ENCOUNTER — Encounter (INDEPENDENT_AMBULATORY_CARE_PROVIDER_SITE_OTHER): Payer: Self-pay

## 2019-12-19 ENCOUNTER — Ambulatory Visit (INDEPENDENT_AMBULATORY_CARE_PROVIDER_SITE_OTHER): Payer: Medicare Other | Admitting: *Deleted

## 2019-12-19 ENCOUNTER — Other Ambulatory Visit: Payer: Self-pay

## 2019-12-19 DIAGNOSIS — Z5181 Encounter for therapeutic drug level monitoring: Secondary | ICD-10-CM

## 2019-12-19 DIAGNOSIS — I4891 Unspecified atrial fibrillation: Secondary | ICD-10-CM

## 2019-12-19 LAB — POCT INR: INR: 2.6 (ref 2.0–3.0)

## 2019-12-19 NOTE — Patient Instructions (Signed)
Description   Continue taking 1 tablet everyday except 1/2 tablet on Mondays, Wednesdays and Fridays.  Recheck INR in 4 weeks. Call with any new medications of if scheduled for any procedures [phone]

## 2019-12-26 DIAGNOSIS — E1142 Type 2 diabetes mellitus with diabetic polyneuropathy: Secondary | ICD-10-CM | POA: Insufficient documentation

## 2020-01-16 ENCOUNTER — Other Ambulatory Visit: Payer: Self-pay

## 2020-01-16 ENCOUNTER — Ambulatory Visit (INDEPENDENT_AMBULATORY_CARE_PROVIDER_SITE_OTHER): Payer: Medicare Other | Admitting: Pharmacist

## 2020-01-16 DIAGNOSIS — Z5181 Encounter for therapeutic drug level monitoring: Secondary | ICD-10-CM | POA: Diagnosis not present

## 2020-01-16 DIAGNOSIS — I4891 Unspecified atrial fibrillation: Secondary | ICD-10-CM | POA: Diagnosis not present

## 2020-01-16 LAB — POCT INR: INR: 2.6 (ref 2.0–3.0)

## 2020-01-16 NOTE — Patient Instructions (Signed)
Description   Continue taking 1 tablet everyday except 1/2 tablet on Mondays, Wednesdays and Fridays.  Recheck INR in 4 weeks. Call with any new medications of if scheduled for any procedures [phone]

## 2020-02-06 ENCOUNTER — Other Ambulatory Visit: Payer: Self-pay | Admitting: Internal Medicine

## 2020-02-10 ENCOUNTER — Other Ambulatory Visit: Payer: Self-pay | Admitting: Internal Medicine

## 2020-02-13 ENCOUNTER — Other Ambulatory Visit: Payer: Self-pay

## 2020-02-13 ENCOUNTER — Telehealth: Payer: Self-pay

## 2020-02-13 ENCOUNTER — Ambulatory Visit (INDEPENDENT_AMBULATORY_CARE_PROVIDER_SITE_OTHER): Payer: Medicare Other | Admitting: Pharmacist

## 2020-02-13 DIAGNOSIS — Z5181 Encounter for therapeutic drug level monitoring: Secondary | ICD-10-CM

## 2020-02-13 DIAGNOSIS — I4891 Unspecified atrial fibrillation: Secondary | ICD-10-CM

## 2020-02-13 LAB — POCT INR: INR: 2 (ref 2.0–3.0)

## 2020-02-13 NOTE — Telephone Encounter (Signed)
Called pt and left a message informing pt that her appt for the coumadin clinic was at 1:15 pm and if she could please be at the office by 1:00 pm. Pt verbalized understanding.

## 2020-02-13 NOTE — Patient Instructions (Signed)
Continue taking 1 tablet everyday except 1/2 tablet on Mondays, Wednesdays and Fridays.  Recheck INR in 5 weeks. Call with any new medications of if scheduled for any procedures [phone]

## 2020-02-17 ENCOUNTER — Telehealth: Payer: Self-pay | Admitting: Internal Medicine

## 2020-02-17 NOTE — Telephone Encounter (Signed)
   Pt c/o BP issue: STAT if pt c/o blurred vision, one-sided weakness or slurred speech  1. What are your last 5 BP readings? Diastolic between [id number] systolic is around [id number]  2. Are you having any other symptoms (ex. Dizziness, headache, blurred vision, passed out)? Fatigue   3. What is your BP issue? Malarie case manager from [employer] and pt is on the phone, would like to report pt's BP diastolic been getting low for the past 2 - 3 days. She went to her pcp and checked her BP cuff and it is accurate.. they would like to know what is Dr. Caryl Comes recommendation.  Please call pt

## 2020-02-17 NOTE — Telephone Encounter (Signed)
I spoke with patient. She reports BP was 160/49 and 160/52 today.  Diastolic has been running in the 50's for last 10 days or so.  Had BP check at primary care recently and her home BP cuff is accurate. She has contacted PCP regarding BP but patient states she was also told by [facility] to make Dr Caryl Comes aware.  Patient reports Imdur, Clonidine and Lisinopril are prescribed by PCP and her BP has been high in the past.  I asked patient to follow up with PCP for BP as they have been following this. I encouraged patient to stay hydrated.  Patient reports she has had fatigue for awhile. She reports her stomach is getting bigger. Was prescribed torsemide at last office visit with Dr Caryl Comes and she is taking this.  She reports PCP has made changes in her potassium dose. Weight was 252 lbs on Monday, 249 lbs on Wednesday and 253 lbs today.  She has shortness of breath but it does not seem to be worse.  Has when walking.  She would like office visit to be evaluated. I scheduled patient to see Oda Kilts, PA on May 4,2021 at 8:00.  Patient aware to bring all her medications to this visit.

## 2020-02-20 NOTE — Progress Notes (Signed)
PCP:  Lauraine Rinne, MD Primary Cardiologist: No primary care provider on file. Electrophysiologist: [name], MD   [name] is a 79 y.o. female seen today for [name], MD for acute visit due to swelling, fatigue, and low diastoilic BP.  Since last being seen in our clinic the patient reports doing well overall. But for the last three weeks she has had increased stress and fatigue. Her blood sugar control has been variable. Her sister is going through a divorce and it has weighed heavily on her. She is not very active, even less so in setting of pandemic. She has chronic dyspnea on moderate exertion that is not worse. Biggest complaint is fatigue. Diastolic pressure have been running in upper 40s lower 50s at home, with systolics [id number]. She denies chest pain, palpitations, PND, orthopnea, nausea, vomiting, dizziness, syncope, edema, weight gain, or early satiety.  Past Medical History:  Diagnosis Date  . Arthritis   . Back pain   . Chronic anticoagulation    due to aflutter  . Chronic kidney disease   . Diabetes mellitus   . Diastolic CHF, chronic ([place])    a.  echo 2006 - ef 55-65%; mild diast dysfxn;    b. Echo 08/2011: Mild LVH, EF 60%;  c. 04/2013 Echo: EF 65-69%, mild conc LVH;  08/2014 Echo: EF 60-65%, mild-mod MR.  . Gout   . Hyperlipidemia   . Hypertension    a.  Renal arterial Dopplers 12/2011: 1-59% right renal artery stenosis  . Morbid obesity ([place])   . Obstructive sleep apnea on CPAP   . Paroxysmal Afib/Flutter    a. 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   Past Surgical History:  Procedure Laterality Date  . APPENDECTOMY    . ATRIAL FLUTTER ABLATION N/A 09/24/2011   Procedure: ATRIAL FLUTTER ABLATION;  Surgeon: [name], MD;  Location: [facility] CATH LAB;  Service: Cardiovascular;  Laterality: N/A;  . CARDIOVERSION  10/22/2011   Procedure: CARDIOVERSION;  Surgeon: [name], MD;  Location: [place];  Service: Cardiovascular;  Laterality: N/A;  .  CARDIOVERSION N/A 09/10/2011   Procedure: CARDIOVERSION;  Surgeon: [name], MD;  Location: [facility] CATH LAB;  Service: Cardiovascular;  Laterality: N/A;  . CHOLECYSTECTOMY    . TONSILLECTOMY  1982  . TOTAL ABDOMINAL HYSTERECTOMY      Current Outpatient Medications  Medication Sig Dispense Refill  . Accu-Chek Softclix Lancets lancets 3 (three) times daily. as directed    . allopurinol (ZYLOPRIM) 300 MG tablet Take 300 mg by mouth daily.     Marland Kitchen amiodarone (PACERONE) 200 MG tablet TAKE 1 TABLET BY MOUTH DAILY MONDAY THROUGH FRIDAY ONLY. PLEASE SCHEDULE OVERDUE APPT FOR FURTHER REFILLS. [phone] 1ST ATTEMPT 30 tablet 0  . cloNIDine (CATAPRES) 0.2 MG tablet Take 0.2 mg by mouth 2 (two) times daily.    Marland Kitchen diltiazem (CARDIZEM CD) 120 MG 24 hr capsule Take one capsule daily; may take twice a day during afib episode 120 capsule 2  . gabapentin (NEURONTIN) 100 MG capsule every morning.    . gabapentin (NEURONTIN) 300 MG capsule     . Insulin Lispro Prot & Lispro (HUMALOG 75/25 MIX) (75-25) 100 UNIT/ML Kwikpen Inject insulin according to sliding scale maximum dose 56. Before breakfast and before dinner    . isosorbide mononitrate (IMDUR) 30 MG 24 hr tablet Take 30 mg by mouth daily.    Marland Kitchen levothyroxine (SYNTHROID, LEVOTHROID) 75 MCG tablet Take 75 mcg daily before breakfast by mouth.     Marland Kitchen  lisinopril (ZESTRIL) 40 MG tablet daily.    Marland Kitchen NOVOFINE PLUS 32G X 4 MM MISC 2 (two) times daily. as directed    . nystatin (MYCOSTATIN/NYSTOP) powder Apply to affect area twice a day.    . potassium chloride (KLOR-CON) 10 MEQ tablet Take 2 tablets (20 mEq total) by mouth daily. 180 tablet 3  . rosuvastatin (CRESTOR) 10 MG tablet Take 10 mg by mouth every evening.    . Semaglutide,0.25 or 0.5MG /DOS, (OZEMPIC, 0.25 OR 0.5 MG/DOSE,) 2 MG/1.5ML SOPN Inject 0.4 mLs into the skin once a week.    . torsemide (DEMADEX) 20 MG tablet Take 2 tablets (40 mg total) by mouth daily. 180 tablet 3  . triamcinolone cream (KENALOG)  0.1 % APPLY EXTERNALLY TO RASH 2 TO 3 TIMES A DAY    . warfarin (COUMADIN) 5 MG tablet TAKE 1/2 TABLET BY MOUTH ON MONDAY, WEDNESDAY AND FRIDAY AND 1 TABLET BY MOUTH ON ALL OTHER DAYS AS DIRECTED BY COUMADIN CLINIC 90 tablet 1   No current facility-administered medications for this visit.    Allergies  Allergen Reactions  . Metformin Diarrhea    Social History   Socioeconomic History  . Marital status: Married    Spouse name: Not on file  . Number of children: Y  . Years of education: Not on file  . Highest education level: Not on file  Occupational History  . Occupation: DISABILITY/housewife    Employer: RETIRED  Tobacco Use  . Smoking status: Never Smoker  . Smokeless tobacco: Never Used  Substance and Sexual Activity  . Alcohol use: No  . Drug use: No  . Sexual activity: Yes  Other Topics Concern  . Not on file  Social History Narrative  . Not on file   Social Determinants of Health   Financial Resource Strain:   . Difficulty of Paying Living Expenses:   Food Insecurity:   . Worried About Charity fundraiser in the Last Year:   . Arboriculturist in the Last Year:   Transportation Needs:   . Film/video editor (Medical):   Marland Kitchen Lack of Transportation (Non-Medical):   Physical Activity:   . Days of Exercise per Week:   . Minutes of Exercise per Session:   Stress:   . Feeling of Stress :   Social Connections:   . Frequency of Communication with Friends and Family:   . Frequency of Social Gatherings with Friends and Family:   . Attends Religious Services:   . Active Member of Clubs or Organizations:   . Attends Archivist Meetings:   Marland Kitchen Marital Status:   Intimate Partner Violence:   . Fear of Current or Ex-Partner:   . Emotionally Abused:   Marland Kitchen Physically Abused:   . Sexually Abused:      Review of Systems: All other systems reviewed and are otherwise negative except as noted above.  Physical Exam: Vitals:   02/21/20 0813  BP: 140/68    Pulse: 60  SpO2: 98%  Weight: 254 lb 9.6 oz (115.5 kg)  Height: 5\' 1"  (1.549 m)    GEN- The patient is well appearing, alert and oriented x 3 today.   HEENT: normocephalic, atraumatic; sclera clear, conjunctiva pink; hearing intact; oropharynx clear; neck supple, no JVP Lymph- no cervical lymphadenopathy Lungs- Clear to ausculation bilaterally, normal work of breathing.  No wheezes, rales, rhonchi Heart- Regular rate and rhythm, no murmurs, rubs or gallops, PMI not laterally displaced GI- Obese, soft, non-tender, non-distended, bowel  sounds present, no hepatosplenomegaly Extremities- no clubbing, cyanosis, or edema; DP/PT/radial pulses 2+ bilaterally MS- no significant deformity or atrophy Skin- warm and dry, no rash or lesion Psych- euthymic mood, full affect Neuro- strength and sensation are intact  EKG is not ordered. Personal review of EKG from 10/25/2019 shows NSR at 60 bpm with 1st degree AV block  Additional studies reviewed include: Previous EP office notes, Previous EKGs, Previous AF clinic notes, Echo 05/2016 with normal EF and no AR to explain wide pulse pressure  Assessment and Plan:  1. Persistent atrial fibrillation Continue amiodarone 200 mg Monday through Friday.  Surveillance labs today. (normal 10/25/2019) Continue diltiazem 120 mg daily (can take extra as needed for AF). She has not needed extra in quite some time.  2. Isolated systolic hypertension Complicated picture with elevated systolic pressure and low diastolic pressure.  Continue clonidine 0.2 mg BID Continue lisinopril 40 mg Discussed personally with Dr. Oval Linsey who runs our hypertension clinic. Imdur may be contributing to lower diastolic pressure. Will decrease to 15 mg daily and follow. Pt has no history of chest pain or known coronary artery disease, so could stop this medicine completely and titrate elsewhere if diastolic pressures remain an issue.  3. Hypothyroidism Surveillance labs today on  amio.  4. Chronic diastolic CHF Volume status stable on exam Continue torsemide 40 mg daily  5. Central obesity Body mass index is 48.11 kg/m.  Encouraged weight loss through lifestyle modification, specifically diet and exercise.   Shirley Friar, PA-C  02/21/20 8:23 AM

## 2020-02-21 ENCOUNTER — Encounter (INDEPENDENT_AMBULATORY_CARE_PROVIDER_SITE_OTHER): Payer: Self-pay

## 2020-02-21 ENCOUNTER — Other Ambulatory Visit: Payer: Self-pay

## 2020-02-21 ENCOUNTER — Ambulatory Visit: Payer: Medicare Other | Admitting: Student

## 2020-02-21 ENCOUNTER — Encounter: Payer: Self-pay | Admitting: Student

## 2020-02-21 VITALS — BP 140/68 | HR 60 | Ht 61.0 in | Wt 254.6 lb

## 2020-02-21 DIAGNOSIS — I5032 Chronic diastolic (congestive) heart failure: Secondary | ICD-10-CM | POA: Diagnosis not present

## 2020-02-21 DIAGNOSIS — E039 Hypothyroidism, unspecified: Secondary | ICD-10-CM

## 2020-02-21 DIAGNOSIS — E1165 Type 2 diabetes mellitus with hyperglycemia: Secondary | ICD-10-CM | POA: Diagnosis not present

## 2020-02-21 DIAGNOSIS — I5031 Acute diastolic (congestive) heart failure: Secondary | ICD-10-CM | POA: Diagnosis not present

## 2020-02-21 DIAGNOSIS — I4819 Other persistent atrial fibrillation: Secondary | ICD-10-CM

## 2020-02-21 DIAGNOSIS — Z79899 Other long term (current) drug therapy: Secondary | ICD-10-CM

## 2020-02-21 DIAGNOSIS — E1142 Type 2 diabetes mellitus with diabetic polyneuropathy: Secondary | ICD-10-CM | POA: Diagnosis not present

## 2020-02-21 DIAGNOSIS — R5383 Other fatigue: Secondary | ICD-10-CM | POA: Diagnosis not present

## 2020-02-21 DIAGNOSIS — I10 Essential (primary) hypertension: Secondary | ICD-10-CM

## 2020-02-21 DIAGNOSIS — I110 Hypertensive heart disease with heart failure: Secondary | ICD-10-CM | POA: Diagnosis not present

## 2020-02-21 LAB — CBC
Hematocrit: 39.5 % (ref 34.0–46.6)
Hemoglobin: 12.6 g/dL (ref 11.1–15.9)
MCH: 26.9 pg (ref 26.6–33.0)
MCHC: 31.9 g/dL (ref 31.5–35.7)
MCV: 84 fL (ref 79–97)
Platelets: 344 10*3/uL (ref 150–450)
RBC: 4.69 x10E6/uL (ref 3.77–5.28)
RDW: 14.9 % (ref 11.7–15.4)
WBC: 11.3 10*3/uL — ABNORMAL HIGH (ref 3.4–10.8)

## 2020-02-21 LAB — COMPREHENSIVE METABOLIC PANEL
ALT: 12 IU/L (ref 0–32)
AST: 16 IU/L (ref 0–40)
Albumin/Globulin Ratio: 1.5 (ref 1.2–2.2)
Albumin: 4.3 g/dL (ref 3.7–4.7)
Alkaline Phosphatase: 148 IU/L — ABNORMAL HIGH (ref 39–117)
BUN/Creatinine Ratio: 15 (ref 12–28)
BUN: 12 mg/dL (ref 8–27)
Bilirubin Total: 0.4 mg/dL (ref 0.0–1.2)
CO2: 31 mmol/L — ABNORMAL HIGH (ref 20–29)
Calcium: 9.1 mg/dL (ref 8.7–10.3)
Chloride: 96 mmol/L (ref 96–106)
Creatinine, Ser: 0.8 mg/dL (ref 0.57–1.00)
GFR calc Af Amer: 83 mL/min/{1.73_m2} (ref >59)
GFR calc non Af Amer: 72 mL/min/{1.73_m2} (ref >59)
Globulin, Total: 2.9 g/dL (ref 1.5–4.5)
Glucose: 163 mg/dL — ABNORMAL HIGH (ref 65–99)
Potassium: 3.3 mmol/L — ABNORMAL LOW (ref 3.5–5.2)
Sodium: 142 mmol/L (ref 134–144)
Total Protein: 7.2 g/dL (ref 6.0–8.5)

## 2020-02-21 LAB — TSH: TSH: 2.43 u[IU]/mL (ref 0.450–4.500)

## 2020-02-21 LAB — MAGNESIUM: Magnesium: 1.9 mg/dL (ref 1.6–2.3)

## 2020-02-21 LAB — T4, FREE: Free T4: 1.43 ng/dL (ref 0.82–1.77)

## 2020-02-21 LAB — T3, FREE: T3, Free: 2 pg/mL (ref 2.0–4.4)

## 2020-02-21 MED ORDER — ISOSORBIDE MONONITRATE ER 30 MG PO TB24: 15.0000 mg | ORAL_TABLET | Freq: Every day | ORAL | 3 refills | Status: DC

## 2020-02-21 NOTE — Patient Instructions (Addendum)
Medication Instructions:  DECREASE ISOSORBIDE MONONITRATE (IMDUR) to 15 mg Daily (1/2 tablet) *If you need a refill on your cardiac medications before your next appointment, please call your pharmacy*   Lab Work:  TODAY CBC CMET MAGNESIUM TSH FREE T3 FREE T4 If you have labs (blood work) drawn today and your tests are completely normal, you will receive your results only by: Marland Kitchen MyChart Message (if you have MyChart) OR . A paper copy in the mail If you have any lab test that is abnormal or we need to change your treatment, we will call you to review the results.   Testing/Procedures: none   Follow-Up: At [facility], you and your health needs are our priority.  As part of our continuing mission to provide you with exceptional heart care, we have created designated Provider Care Teams.  These Care Teams include your primary Cardiologist (physician) and Advanced Practice Providers (APPs -  Physician Assistants and Nurse Practitioners) who all work together to provide you with the care you need, when you need it.  We recommend signing up for the patient portal called "MyChart".  Sign up information is provided on this After Visit Summary.  MyChart is used to connect with patients for Virtual Visits (Telemedicine).  Patients are able to view lab/test results, encounter notes, upcoming appointments, etc.  Non-urgent messages can be sent to your provider as well.   To learn more about what you can do with MyChart, go to [email or web address].    Your next appointment:   6 MONTHS  The format for your next appointment:   Either In Person or Virtual  Provider:   Dr Caryl Comes   Other Instructions

## 2020-02-22 ENCOUNTER — Telehealth: Payer: Self-pay

## 2020-02-22 DIAGNOSIS — I4891 Unspecified atrial fibrillation: Secondary | ICD-10-CM

## 2020-02-22 DIAGNOSIS — E876 Hypokalemia: Secondary | ICD-10-CM

## 2020-02-22 DIAGNOSIS — Z79899 Other long term (current) drug therapy: Secondary | ICD-10-CM

## 2020-02-22 NOTE — Telephone Encounter (Signed)
The patient has been notified of the lab result and verbalized understanding.  All questions (if any) were answered. Frederik Schmidt, RN 02/22/2020 10:09 AM    She will come in for labs on 5/13 (BMET/MAGNESIUM)

## 2020-02-22 NOTE — Telephone Encounter (Signed)
-----   Message from Shirley Friar, PA-C sent at 02/21/2020  8:23 PM EDT ----- Can we please make sure she is taking her potassium correctly?  It is ordered as 40 meq daily.  I would like her to increase to 40 meq BID if that is how she is currently taking it.   Will need repeat BMET and Mg in 7 days.

## 2020-02-28 ENCOUNTER — Other Ambulatory Visit: Payer: Self-pay

## 2020-02-28 ENCOUNTER — Encounter: Payer: Self-pay | Admitting: Registered Nurse

## 2020-02-28 ENCOUNTER — Ambulatory Visit (INDEPENDENT_AMBULATORY_CARE_PROVIDER_SITE_OTHER): Payer: Medicare Other | Admitting: Registered Nurse

## 2020-02-28 VITALS — BP 135/60 | HR 69 | Temp 98.2°F | Resp 17 | Ht 61.0 in | Wt 256.2 lb

## 2020-02-28 DIAGNOSIS — E1169 Type 2 diabetes mellitus with other specified complication: Secondary | ICD-10-CM | POA: Diagnosis not present

## 2020-02-28 DIAGNOSIS — E1142 Type 2 diabetes mellitus with diabetic polyneuropathy: Secondary | ICD-10-CM | POA: Diagnosis not present

## 2020-02-28 LAB — POCT GLYCOSYLATED HEMOGLOBIN (HGB A1C): Hemoglobin A1C: 7.8 % — AB (ref 4.0–5.6)

## 2020-02-28 MED ORDER — GABAPENTIN 300 MG PO CAPS: 300.0000 mg | ORAL_CAPSULE | Freq: Three times a day (TID) | ORAL | 3 refills | Status: DC

## 2020-02-28 NOTE — Progress Notes (Signed)
New Patient Office Visit  Subjective:  Patient ID: [name], female    DOB: [birth year]  Age: 79 y.o. MRN: [id number]  CC:  Chief Complaint  Patient presents with  . New Patient (Initial Visit)    Establish care. Patient states she has been having a ringing in her ear for 6 months and had a MRI but its still bothering her. Per patient she is on 4 blood medications and she keep getting low reading sometimes 49.    HPI [name] presents for visit to establish care  Formerly seen at [facility]   Est with cardiology - on coumadin anticoag, monitored through her cardiologist's office for anticoag. Diastolic CHF, steady. Aflutter.  Notes that her blood sugars are not under control. Does not monitor routinely. Does not have a good diet, very limited with exercise due to polyneuropathy. Interested in endocrinology referral for T2DM management in the setting of CKD, CHF, morbid obesity. Has been seen by nutrition in the past.  Biggest concern today is her tinnitus. It has been ongoing for around 5 months - waxes and wanes. She feels very upset about this. Has received work up from last pcp and audiology, unfortunately, was told there is likely nothing that can be done. We discussed the pathophys of tinnitus and its status as a chronic condition.  Also notes her BP has been labile - has been stressed a lot lately. Her brother in law is actively dying and she is trying to be a source of support for her sister at this time. She is not symptomatic and follows routinely with cardiology, who she has been with for 16 years.   Past Medical History:  Diagnosis Date  . Arthritis   . Back pain   . Chronic anticoagulation    due to aflutter  . Chronic kidney disease   . Diabetes mellitus   . Diastolic CHF, chronic ([place])    a.  echo 2006 - ef 55-65%; mild diast dysfxn;    b. Echo 08/2011: Mild LVH, EF 60%;  c. 04/2013 Echo: EF 65-69%, mild conc LVH;  08/2014 Echo: EF 60-65%, mild-mod MR.  . Gout    . Hyperlipidemia   . Hypertension    a.  Renal arterial Dopplers 12/2011: 1-59% right renal artery stenosis  . Morbid obesity ([place])   . Obstructive sleep apnea on CPAP   . Paroxysmal Afib/Flutter    a. 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 APPENDECTOMY    . ATRIAL FLUTTER ABLATION N/A 09/24/2011   Procedure: ATRIAL FLUTTER ABLATION;  Surgeon: [name], MD;  Location: [facility] CATH LAB;  Service: Cardiovascular;  Laterality: N/A;  . CARDIOVERSION  10/22/2011   Procedure: CARDIOVERSION;  Surgeon: [name], MD;  Location: [place];  Service: Cardiovascular;  Laterality: N/A;  . CARDIOVERSION N/A 09/10/2011   Procedure: CARDIOVERSION;  Surgeon: [name], MD;  Location: [facility] CATH LAB;  Service: Cardiovascular;  Laterality: N/A;  . CHOLECYSTECTOMY    . TONSILLECTOMY  1982  . TOTAL ABDOMINAL HYSTERECTOMY      Family History  Problem Relation Age of Onset  . Heart disease Father   . Hypertension Father   . Breast cancer Sister   . Cancer Sister        breast    Social History   Socioeconomic History  . Marital status: Married    Spouse name: Not on file  . Number of children: 3  .  Years of education: Not on file  . Highest education level: Not on file  Occupational History  . Occupation: DISABILITY/housewife    Employer: RETIRED  Tobacco Use  . Smoking status: Never Smoker  . Smokeless tobacco: Never Used  Substance and Sexual Activity  . Alcohol use: No  . Drug use: No  . Sexual activity: Yes  Other Topics Concern  . Not on file  Social History Narrative  . Not on file   Social Determinants of Health   Financial Resource Strain:   . Difficulty of Paying Living Expenses:   Food Insecurity:   . Worried About Charity fundraiser in the Last Year:   . Arboriculturist in the Last Year:   Transportation Needs:   . Film/video editor (Medical):   Marland Kitchen Lack of Transportation (Non-Medical):   Physical  Activity:   . Days of Exercise per Week:   . Minutes of Exercise per Session:   Stress:   . Feeling of Stress :   Social Connections:   . Frequency of Communication with Friends and Family:   . Frequency of Social Gatherings with Friends and Family:   . Attends Religious Services:   . Active Member of Clubs or Organizations:   . Attends Archivist Meetings:   Marland Kitchen Marital Status:   Intimate Partner Violence:   . Fear of Current or Ex-Partner:   . Emotionally Abused:   Marland Kitchen Physically Abused:   . Sexually Abused:     ROS Review of Systems  Constitutional: Negative.   HENT: Positive for tinnitus. Negative for congestion, dental problem, drooling, ear discharge, ear pain, facial swelling, hearing loss, mouth sores, nosebleeds, postnasal drip, rhinorrhea, sinus pressure, sinus pain, sneezing, sore throat, trouble swallowing and voice change.   Eyes: Negative.   Respiratory: Negative.   Cardiovascular: Negative.   Gastrointestinal: Negative.   Endocrine: Negative.   Genitourinary: Negative.   Musculoskeletal: Negative.   Skin: Negative.   Allergic/Immunologic: Negative.   Neurological: Negative.   Hematological: Negative.   Psychiatric/Behavioral: Negative.   All other systems reviewed and are negative.   Objective:   Today's Vitals: BP 135/60   Pulse 69   Temp 98.2 F (36.8 C) (Temporal)   Resp 17   Ht 5\' 1"  (1.549 m)   Wt 256 lb 3.2 oz (116.2 kg)   SpO2 96%   BMI 48.41 kg/m   Physical Exam Vitals and nursing note reviewed.  Constitutional:      General: She is not in acute distress.    Appearance: Normal appearance. She is obese. She is not ill-appearing, toxic-appearing or diaphoretic.  Cardiovascular:     Rate and Rhythm: Normal rate and regular rhythm.  Pulmonary:     Effort: Pulmonary effort is normal. No respiratory distress.  Neurological:     General: No focal deficit present.     Mental Status: She is alert and oriented to person, place, and  time. Mental status is at baseline.  Psychiatric:        Mood and Affect: Mood normal.        Behavior: Behavior normal.        Thought Content: Thought content normal.        Judgment: Judgment normal.     Assessment & Plan:   Problem List Items Addressed This Visit    None    Visit Diagnoses    Type 2 diabetes mellitus with other specified complication, without long-term current use of insulin ([employer])    -  Primary   Relevant Orders   POCT glycosylated hemoglobin (Hb A1C) (Completed)   Lipid Panel   Ambulatory referral to Ophthalmology   Ambulatory referral to Endocrinology   Ambulatory referral to Podiatry   Diabetic polyneuropathy associated with type 2 diabetes mellitus ([place])       Relevant Medications   gabapentin (NEURONTIN) 300 MG capsule   Other Relevant Orders   Ambulatory referral to Ophthalmology   Ambulatory referral to Endocrinology   Ambulatory referral to Podiatry      Outpatient Encounter Medications as of 02/28/2020  Medication Sig  . Accu-Chek Softclix Lancets lancets 3 (three) times daily. as directed  . allopurinol (ZYLOPRIM) 300 MG tablet Take 300 mg by mouth daily.   Marland Kitchen amiodarone (PACERONE) 200 MG tablet TAKE 1 TABLET BY MOUTH DAILY MONDAY THROUGH FRIDAY ONLY. PLEASE SCHEDULE OVERDUE APPT FOR FURTHER REFILLS. [phone] 1ST ATTEMPT  . cloNIDine (CATAPRES) 0.2 MG tablet Take 0.2 mg by mouth 2 (two) times daily.  Marland Kitchen diltiazem (CARDIZEM CD) 120 MG 24 hr capsule Take one capsule daily; may take twice a day during afib episode  . Insulin Lispro Prot & Lispro (HUMALOG 75/25 MIX) (75-25) 100 UNIT/ML Kwikpen Inject insulin according to sliding scale maximum dose 56. Before breakfast and before dinner  . isosorbide mononitrate (IMDUR) 30 MG 24 hr tablet Take 0.5 tablets (15 mg total) by mouth daily.  Marland Kitchen levothyroxine (SYNTHROID, LEVOTHROID) 75 MCG tablet Take 75 mcg daily before breakfast by mouth.   Marland Kitchen lisinopril (ZESTRIL) 40 MG tablet daily.  Marland Kitchen NOVOFINE PLUS 32G  X 4 MM MISC 2 (two) times daily. as directed  . nystatin (MYCOSTATIN/NYSTOP) powder Apply to affect area twice a day.  . potassium chloride (KLOR-CON) 10 MEQ tablet Take 2 tablets (20 mEq total) by mouth daily.  . rosuvastatin (CRESTOR) 10 MG tablet Take 10 mg by mouth every evening.  . Semaglutide,0.25 or 0.5MG /DOS, (OZEMPIC, 0.25 OR 0.5 MG/DOSE,) 2 MG/1.5ML SOPN Inject 0.4 mLs into the skin once a week.  . torsemide (DEMADEX) 20 MG tablet Take 2 tablets (40 mg total) by mouth daily.  Marland Kitchen triamcinolone cream (KENALOG) 0.1 % APPLY EXTERNALLY TO RASH 2 TO 3 TIMES A DAY  . warfarin (COUMADIN) 5 MG tablet TAKE 1/2 TABLET BY MOUTH ON MONDAY, WEDNESDAY AND FRIDAY AND 1 TABLET BY MOUTH ON ALL OTHER DAYS AS DIRECTED BY COUMADIN CLINIC  . [DISCONTINUED] gabapentin (NEURONTIN) 100 MG capsule every morning.  . [DISCONTINUED] gabapentin (NEURONTIN) 300 MG capsule   . gabapentin (NEURONTIN) 300 MG capsule Take 1 capsule (300 mg total) by mouth 3 (three) times daily.   No facility-administered encounter medications on file as of 02/28/2020.    Follow-up: No follow-ups on file.   PLAN  Continue on current med regimen, but increase gabapentin to 300mg  PO tid PRN  Referrals to ophthalmology, podiatry, and endocrinology  Labs collected, will follow up as warranted  Patient encouraged to call clinic with any questions, comments, or concerns.  Maximiano Coss, NP

## 2020-02-28 NOTE — Patient Instructions (Signed)
° ° ° °  If you have lab work done today you will be contacted with your lab results within the next 2 weeks.  If you have not heard from us then please contact us. The fastest way to get your results is to register for My Chart. ° ° °IF you received an x-ray today, you will receive an invoice from [facility]. Please contact [facility] at [phone] with questions or concerns regarding your invoice.  ° °IF you received labwork today, you will receive an invoice from [facility]. Please contact [facility] at [phone] with questions or concerns regarding your invoice.  ° °Our billing staff will not be able to assist you with questions regarding bills from these companies. ° °You will be contacted with the lab results as soon as they are available. The fastest way to get your results is to activate your My Chart account. Instructions are located on the last page of this paperwork. If you have not heard from us regarding the results in 2 weeks, please contact this office. °  ° ° ° °

## 2020-02-29 LAB — LIPID PANEL
Chol/HDL Ratio: 2.5 ratio (ref 0.0–4.4)
Cholesterol, Total: 146 mg/dL (ref 100–199)
HDL: 58 mg/dL (ref >39)
LDL Chol Calc (NIH): 52 mg/dL (ref 0–99)
Triglycerides: 226 mg/dL — ABNORMAL HIGH (ref 0–149)
VLDL Cholesterol Cal: 36 mg/dL (ref 5–40)

## 2020-03-01 ENCOUNTER — Other Ambulatory Visit: Payer: Medicare Other

## 2020-03-02 DIAGNOSIS — H538 Other visual disturbances: Secondary | ICD-10-CM | POA: Diagnosis not present

## 2020-03-02 DIAGNOSIS — H2513 Age-related nuclear cataract, bilateral: Secondary | ICD-10-CM | POA: Diagnosis not present

## 2020-03-02 DIAGNOSIS — H43811 Vitreous degeneration, right eye: Secondary | ICD-10-CM | POA: Diagnosis not present

## 2020-03-02 DIAGNOSIS — E119 Type 2 diabetes mellitus without complications: Secondary | ICD-10-CM | POA: Diagnosis not present

## 2020-03-05 DIAGNOSIS — E119 Type 2 diabetes mellitus without complications: Secondary | ICD-10-CM | POA: Diagnosis not present

## 2020-03-07 ENCOUNTER — Telehealth: Payer: Self-pay | Admitting: Registered Nurse

## 2020-03-07 ENCOUNTER — Other Ambulatory Visit: Payer: Self-pay | Admitting: Registered Nurse

## 2020-03-07 DIAGNOSIS — E1142 Type 2 diabetes mellitus with diabetic polyneuropathy: Secondary | ICD-10-CM

## 2020-03-07 MED ORDER — PREGABALIN 25 MG PO CAPS: 25.0000 mg | ORAL_CAPSULE | Freq: Two times a day (BID) | ORAL | 1 refills | Status: DC

## 2020-03-07 NOTE — Telephone Encounter (Signed)
Pt was written a script for gabapentin (NEURONTIN) 300 MG capsule [id number] for Sig: Take 1 capsule (300 mg total) by mouth 3 (three) times daily.. Pt states she was walking around feeling like a zombie. She has stopped taking 3 and now only tales one at night. She needs to speak with someone immediately. Please advise at [phone].

## 2020-03-07 NOTE — Progress Notes (Signed)
Pt not tolerating gabapentin due to sedation, which we had discussed as a possible side effect.  We can try pregabalin 25mg  PO bid. Unfortunately, there are not many effective therapies for polyneuropathies secondary to uncontrolled t2dm that do not have a potential side effect of sedation. We had discussed at her recent visit that the best way to stop the progression of her polyneuropathies was to adequately control her blood sugars and a1c.  Kathrin Ruddy, NP

## 2020-03-07 NOTE — Telephone Encounter (Signed)
Spoke with pt and she stated that the gabapentin that you prescribed her to take 3x daily was making her sleepy so now she is just taking only 1 at night. Her F/U appt is with Sagardia on 04/10/2020.  Please Advise

## 2020-03-07 NOTE — Telephone Encounter (Signed)
I have sent over pregabalin 25mg  PO bid. This still carries some risk of sedation. Her best bet for avoiding sedation and worsening neuropathies is tighter control of her T2DM.  Thank you  Kathrin Ruddy, NP

## 2020-03-08 NOTE — Telephone Encounter (Signed)
I spoke with pt and she stated that she does not want to take any more new medications until she gets established with her new Dr.

## 2020-03-09 DIAGNOSIS — E1165 Type 2 diabetes mellitus with hyperglycemia: Secondary | ICD-10-CM | POA: Diagnosis not present

## 2020-03-09 DIAGNOSIS — Z9641 Presence of insulin pump (external) (internal): Secondary | ICD-10-CM | POA: Diagnosis not present

## 2020-03-09 DIAGNOSIS — E039 Hypothyroidism, unspecified: Secondary | ICD-10-CM | POA: Diagnosis not present

## 2020-03-09 DIAGNOSIS — E785 Hyperlipidemia, unspecified: Secondary | ICD-10-CM | POA: Diagnosis not present

## 2020-03-14 ENCOUNTER — Encounter: Payer: Self-pay | Admitting: Emergency Medicine

## 2020-03-14 ENCOUNTER — Ambulatory Visit (INDEPENDENT_AMBULATORY_CARE_PROVIDER_SITE_OTHER): Payer: Medicare Other | Admitting: Emergency Medicine

## 2020-03-14 ENCOUNTER — Other Ambulatory Visit: Payer: Self-pay

## 2020-03-14 VITALS — BP 200/83 | HR 68 | Temp 98.5°F | Ht 59.0 in | Wt 250.8 lb

## 2020-03-14 DIAGNOSIS — E1159 Type 2 diabetes mellitus with other circulatory complications: Secondary | ICD-10-CM

## 2020-03-14 DIAGNOSIS — G4733 Obstructive sleep apnea (adult) (pediatric): Secondary | ICD-10-CM

## 2020-03-14 DIAGNOSIS — Z7901 Long term (current) use of anticoagulants: Secondary | ICD-10-CM

## 2020-03-14 DIAGNOSIS — Z8739 Personal history of other diseases of the musculoskeletal system and connective tissue: Secondary | ICD-10-CM

## 2020-03-14 DIAGNOSIS — I10 Essential (primary) hypertension: Secondary | ICD-10-CM

## 2020-03-14 DIAGNOSIS — E1142 Type 2 diabetes mellitus with diabetic polyneuropathy: Secondary | ICD-10-CM

## 2020-03-14 DIAGNOSIS — I480 Paroxysmal atrial fibrillation: Secondary | ICD-10-CM

## 2020-03-14 DIAGNOSIS — Z9989 Dependence on other enabling machines and devices: Secondary | ICD-10-CM

## 2020-03-14 DIAGNOSIS — I152 Hypertension secondary to endocrine disorders: Secondary | ICD-10-CM

## 2020-03-14 DIAGNOSIS — Z7689 Persons encountering health services in other specified circumstances: Secondary | ICD-10-CM | POA: Diagnosis not present

## 2020-03-14 DIAGNOSIS — I5032 Chronic diastolic (congestive) heart failure: Secondary | ICD-10-CM

## 2020-03-14 NOTE — Patient Instructions (Addendum)
   If you have lab work done today you will be contacted with your lab results within the next 2 weeks.  If you have not heard from us then please contact us. The fastest way to get your results is to register for My Chart.   IF you received an x-ray today, you will receive an invoice from [facility]. Please contact [facility] at [phone] with questions or concerns regarding your invoice.   IF you received labwork today, you will receive an invoice from [facility]. Please contact [facility] at [phone] with questions or concerns regarding your invoice.   Our billing staff will not be able to assist you with questions regarding bills from these companies.  You will be contacted with the lab results as soon as they are available. The fastest way to get your results is to activate your My Chart account. Instructions are located on the last page of this paperwork. If you have not heard from us regarding the results in 2 weeks, please contact this office.      Hypertension, Adult High blood pressure (hypertension) is when the force of blood pumping through the arteries is too strong. The arteries are the blood vessels that carry blood from the heart throughout the body. Hypertension forces the heart to work harder to pump blood and may cause arteries to become narrow or stiff. Untreated or uncontrolled hypertension can cause a heart attack, heart failure, a stroke, kidney disease, and other problems. A blood pressure reading consists of a higher number over a lower number. Ideally, your blood pressure should be below 120/80. The first ("top") number is called the systolic pressure. It is a measure of the pressure in your arteries as your heart beats. The second ("bottom") number is called the diastolic pressure. It is a measure of the pressure in your arteries as the heart relaxes. What are the causes? The exact cause of this condition is not known. There are some conditions  that result in or are related to high blood pressure. What increases the risk? Some risk factors for high blood pressure are under your control. The following factors may make you more likely to develop this condition:  Smoking.  Having type 2 diabetes mellitus, high cholesterol, or both.  Not getting enough exercise or physical activity.  Being overweight.  Having too much fat, sugar, calories, or salt (sodium) in your diet.  Drinking too much alcohol. Some risk factors for high blood pressure may be difficult or impossible to change. Some of these factors include:  Having chronic kidney disease.  Having a family history of high blood pressure.  Age. Risk increases with age.  Race. You may be at higher risk if you are African American.  Gender. Men are at higher risk than women before age 45. After age 65, women are at higher risk than men.  Having obstructive sleep apnea.  Stress. What are the signs or symptoms? High blood pressure may not cause symptoms. Very high blood pressure (hypertensive crisis) may cause:  Headache.  Anxiety.  Shortness of breath.  Nosebleed.  Nausea and vomiting.  Vision changes.  Severe chest pain.  Seizures. How is this diagnosed? This condition is diagnosed by measuring your blood pressure while you are seated, with your arm resting on a flat surface, your legs uncrossed, and your feet flat on the floor. The cuff of the blood pressure monitor will be placed directly against the skin of your upper arm at the level of your   heart. It should be measured at least twice using the same arm. Certain conditions can cause a difference in blood pressure between your right and left arms. Certain factors can cause blood pressure readings to be lower or higher than normal for a short period of time:  When your blood pressure is higher when you are in a health care provider's office than when you are at home, this is called white coat hypertension.  Most people with this condition do not need medicines.  When your blood pressure is higher at home than when you are in a health care provider's office, this is called masked hypertension. Most people with this condition may need medicines to control blood pressure. If you have a high blood pressure reading during one visit or you have normal blood pressure with other risk factors, you may be asked to:  Return on a different day to have your blood pressure checked again.  Monitor your blood pressure at home for 1 week or longer. If you are diagnosed with hypertension, you may have other blood or imaging tests to help your health care provider understand your overall risk for other conditions. How is this treated? This condition is treated by making healthy lifestyle changes, such as eating healthy foods, exercising more, and reducing your alcohol intake. Your health care provider may prescribe medicine if lifestyle changes are not enough to get your blood pressure under control, and if:  Your systolic blood pressure is above 130.  Your diastolic blood pressure is above 80. Your personal target blood pressure may vary depending on your medical conditions, your age, and other factors. Follow these instructions at home: Eating and drinking   Eat a diet that is high in fiber and potassium, and low in sodium, added sugar, and fat. An example eating plan is called the DASH (Dietary Approaches to Stop Hypertension) diet. To eat this way: ? Eat plenty of fresh fruits and vegetables. Try to fill one half of your plate at each meal with fruits and vegetables. ? Eat whole grains, such as whole-wheat pasta, brown rice, or whole-grain bread. Fill about one fourth of your plate with whole grains. ? Eat or drink low-fat dairy products, such as skim milk or low-fat yogurt. ? Avoid fatty cuts of meat, processed or cured meats, and poultry with skin. Fill about one fourth of your plate with lean proteins, such  as fish, chicken without skin, beans, eggs, or tofu. ? Avoid pre-made and processed foods. These tend to be higher in sodium, added sugar, and fat.  Reduce your daily sodium intake. Most people with hypertension should eat less than 1,500 mg of sodium a day.  Do not drink alcohol if: ? Your health care provider tells you not to drink. ? You are pregnant, may be pregnant, or are planning to become pregnant.  If you drink alcohol: ? Limit how much you use to:  0-1 drink a day for women.  0-2 drinks a day for men. ? Be aware of how much alcohol is in your drink. In the [place] one drink equals one 12 oz bottle of beer (355 mL), one 5 oz glass of wine (148 mL), or one 1 oz glass of hard liquor (44 mL). Lifestyle   Work with your health care provider to maintain a healthy body weight or to lose weight. Ask what an ideal weight is for you.  Get at least 30 minutes of exercise most days of the week. Activities may include walking, swimming,   or biking.  Include exercise to strengthen your muscles (resistance exercise), such as Pilates or lifting weights, as part of your weekly exercise routine. Try to do these types of exercises for 30 minutes at least 3 days a week.  Do not use any products that contain nicotine or tobacco, such as cigarettes, e-cigarettes, and chewing tobacco. If you need help quitting, ask your health care provider.  Monitor your blood pressure at home as told by your health care provider.  Keep all follow-up visits as told by your health care provider. This is important. Medicines  Take over-the-counter and prescription medicines only as told by your health care provider. Follow directions carefully. Blood pressure medicines must be taken as prescribed.  Do not skip doses of blood pressure medicine. Doing this puts you at risk for problems and can make the medicine less effective.  Ask your health care provider about side effects or reactions to medicines that you  should watch for. Contact a health care provider if you:  Think you are having a reaction to a medicine you are taking.  Have headaches that keep coming back (recurring).  Feel dizzy.  Have swelling in your ankles.  Have trouble with your vision. Get help right away if you:  Develop a severe headache or confusion.  Have unusual weakness or numbness.  Feel faint.  Have severe pain in your chest or abdomen.  Vomit repeatedly.  Have trouble breathing. Summary  Hypertension is when the force of blood pumping through your arteries is too strong. If this condition is not controlled, it may put you at risk for serious complications.  Your personal target blood pressure may vary depending on your medical conditions, your age, and other factors. For most people, a normal blood pressure is less than 120/80.  Hypertension is treated with lifestyle changes, medicines, or a combination of both. Lifestyle changes include losing weight, eating a healthy, low-sodium diet, exercising more, and limiting alcohol. This information is not intended to replace advice given to you by your health care provider. Make sure you discuss any questions you have with your health care provider. Document Revised: 06/16/2018 Document Reviewed: 06/16/2018 [employer] Patient Education  2020 [employer]  

## 2020-03-14 NOTE — Progress Notes (Signed)
[name] 79 y.o.   Chief Complaint  Patient presents with  . Diabetes    f/u   . Medication Management    asking about lyrica and gabapentin     HISTORY OF PRESENT ILLNESS: This is a 79 y.o. female first visit with me, here to establish care.  Has multiple chronic medical problems as follows: 1.  Hypertension: On clonidine, Cardizem CD and lisinopril.  Did not take blood pressure medication this morning.  Was rushing to come to the office. 2.  Paroxysmal atrial fibrillation: On long-term anticoagulation with Coumadin. Goes to Coumadin clinic.  Also on amiodarone. 3.  Congestive heart failure: Sees cardiologist on a regular basis.  Takes torsemide. 4.  Diabetes insulin-dependent.  Sees endocrinologist on a regular basis.  Also takes Ozempic weekly.  On statin therapy with rosuvastatin 10 mg daily.  Intolerant to Metformin. 5.  Obstructive sleep apnea on CPAP treatment 6.  Chronic kidney disease: Stable 7.  History of gout: On allopurinol 300 mg daily. Recently seen here by my colleague Maximiano Coss and was started on gabapentin for diabetic polyneuropathy but sedation became a problem and she stopped.  Was then prescribed Lyrica 25 mg twice a day but has not started yet. No other complaints or medical concerns today.  HPI   Prior to Admission medications   Medication Sig Start Date End Date Taking? Authorizing Provider  allopurinol (ZYLOPRIM) 300 MG tablet Take 300 mg by mouth daily.  07/07/13  Yes [provider]  amiodarone (PACERONE) 200 MG tablet TAKE 1 TABLET BY MOUTH DAILY MONDAY THROUGH FRIDAY ONLY. PLEASE SCHEDULE OVERDUE APPT FOR FURTHER REFILLS. [id number] 1ST ATTEMPT 02/10/20  Yes [name], MD  cloNIDine (CATAPRES) 0.2 MG tablet Take 0.2 mg by mouth 2 (two) times daily.   Yes [provider]  diltiazem (CARDIZEM CD) 120 MG 24 hr capsule Take one capsule daily; may take twice a day during afib episode 10/17/19  Yes [name], MD    Insulin Lispro Prot & Lispro (HUMALOG 75/25 MIX) (75-25) 100 UNIT/ML Kwikpen Inject insulin according to sliding scale maximum dose 56. Before breakfast and before dinner 08/12/16  Yes [provider]  levothyroxine (SYNTHROID, LEVOTHROID) 75 MCG tablet Take 75 mcg daily before breakfast by mouth.  12/29/16  Yes [provider]  lisinopril (ZESTRIL) 40 MG tablet daily. 12/02/19  Yes [provider]  NOVOFINE PLUS 32G X 4 MM MISC 2 (two) times daily. as directed 10/15/19  Yes [provider]  potassium chloride (KLOR-CON) 10 MEQ tablet Take 2 tablets (20 mEq total) by mouth daily. 10/18/19  Yes [name], MD  RESTASIS MULTIDOSE 0.05 % ophthalmic emulsion 1 drop 2 (two) times daily. 03/04/20  Yes [provider]  rosuvastatin (CRESTOR) 10 MG tablet Take 10 mg by mouth every evening.   Yes [provider]  Semaglutide,0.25 or 0.5MG /DOS, (OZEMPIC, 0.25 OR 0.5 MG/DOSE,) 2 MG/1.5ML SOPN Inject 0.4 mLs into the skin once a week. 05/17/19  Yes [provider]  torsemide (DEMADEX) 20 MG tablet Take 2 tablets (40 mg total) by mouth daily. 09/29/19  Yes [name], MD  triamcinolone cream (KENALOG) 0.1 % APPLY EXTERNALLY TO RASH 2 TO 3 TIMES A DAY 10/13/19  Yes [provider]  warfarin (COUMADIN) 5 MG tablet TAKE 1/2 TABLET BY MOUTH ON MONDAY, WEDNESDAY AND FRIDAY AND 1 TABLET BY MOUTH ON ALL OTHER DAYS AS DIRECTED BY COUMADIN CLINIC 02/06/20  Yes [name], MD  Accu-Chek  Softclix Lancets lancets 3 (three) times daily. as directed 09/27/19   [provider]  gabapentin (NEURONTIN) 300 MG capsule Take 1 capsule (300 mg total) by mouth 3 (three) times daily. Patient not taking: Reported on 03/14/2020 02/28/20   Maximiano Coss, NP  isosorbide mononitrate (IMDUR) 30 MG 24 hr tablet Take 0.5 tablets (15 mg total) by mouth daily. Patient not taking: Reported on 03/14/2020 02/21/20   Shirley Friar, PA-C  nystatin  (MYCOSTATIN/NYSTOP) powder Apply to affect area twice a day. 10/13/19   [provider]  pregabalin (LYRICA) 25 MG capsule Take 1 capsule (25 mg total) by mouth 2 (two) times daily. Patient not taking: Reported on 03/14/2020 03/07/20   Maximiano Coss, NP    Allergies  Allergen Reactions  . Metformin Diarrhea    Patient Active Problem List   Diagnosis Date Noted  . Obesity 12/19/2015  . Hypersomnia 12/19/2015  . Encounter for therapeutic drug monitoring 12/02/2013  . Dyspnea 10/03/2013  . Acute diastolic congestive heart failure, NYHA class 3 ([place]) 10/03/2013  . Hypokalemia 10/03/2013  . Prolonged QT interval 10/03/2013  . Acute diastolic heart failure ([place]) 10/03/2013  . Elevated transaminase level 08/11/2013  . CAP (community acquired pneumonia) 04/21/2013  . Postural dizziness 04/05/2013  . Chest pain 11/11/2012  . Long term (current) use of anticoagulants 06/09/2012  . Leukocytosis 04/09/2012  . Sinus bradycardia 10/01/2011  . Back pain   . Lumbar strain 08/19/2011  . BENIGN NEOPLASM OF ADRENAL GLAND 11/25/2010  . Chronic diastolic heart failure ([place]) 02/20/2010  . DM 05/16/2009  . GOUT 05/16/2009  . OBESITY, MORBID 05/16/2009  . Essential hypertension 05/16/2009  . ATRIAL FIBRILLATION, PAROXYSMAL 05/16/2009  . SYNCOPE, HX OF 05/16/2009  . ACHILLES TENDINITIS 01/22/2009  . HYPERLIPIDEMIA 11/30/2008  . Obstructive sleep apnea 11/30/2008    Past Medical History:  Diagnosis Date  . Arthritis   . Back pain   . Chronic anticoagulation    due to aflutter  . Chronic kidney disease   . Diabetes mellitus   . Diastolic CHF, chronic ([place])    a.  echo 2006 - ef 55-65%; mild diast dysfxn;    b. Echo 08/2011: Mild LVH, EF 60%;  c. 04/2013 Echo: EF 65-69%, mild conc LVH;  08/2014 Echo: EF 60-65%, mild-mod MR.  . Gout   . Hyperlipidemia   . Hypertension    a.  Renal arterial Dopplers 12/2011: 1-59% right renal artery stenosis  . Morbid obesity ([place])   . Obstructive  sleep apnea on CPAP   . Paroxysmal Afib/Flutter    a. 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 APPENDECTOMY    . ATRIAL FLUTTER ABLATION N/A 09/24/2011   Procedure: ATRIAL FLUTTER ABLATION;  Surgeon: [name], MD;  Location: [facility] CATH LAB;  Service: Cardiovascular;  Laterality: N/A;  . CARDIOVERSION  10/22/2011   Procedure: CARDIOVERSION;  Surgeon: [name], MD;  Location: [facility];  Service: Cardiovascular;  Laterality: N/A;  . CARDIOVERSION N/A 09/10/2011   Procedure: CARDIOVERSION;  Surgeon: [name], MD;  Location: [facility] CATH LAB;  Service: Cardiovascular;  Laterality: N/A;  . CHOLECYSTECTOMY    . TONSILLECTOMY  1982  . TOTAL ABDOMINAL HYSTERECTOMY      Social History   Socioeconomic History  . Marital status: Married    Spouse name: Not on file  . Number of children: 3  . Years of education: Not on file  . Highest education level:  Not on file  Occupational History  . Occupation: DISABILITY/housewife    Employer: RETIRED  Tobacco Use  . Smoking status: Never Smoker  . Smokeless tobacco: Never Used  Substance and Sexual Activity  . Alcohol use: No  . Drug use: No  . Sexual activity: Yes  Other Topics Concern  . Not on file  Social History Narrative  . Not on file   Social Determinants of Health   Financial Resource Strain:   . Difficulty of Paying Living Expenses:   Food Insecurity:   . Worried About Charity fundraiser in the Last Year:   . Arboriculturist in the Last Year:   Transportation Needs:   . Film/video editor (Medical):   Marland Kitchen Lack of Transportation (Non-Medical):   Physical Activity:   . Days of Exercise per Week:   . Minutes of Exercise per Session:   Stress:   . Feeling of Stress :   Social Connections:   . Frequency of Communication with Friends and Family:   . Frequency of Social Gatherings with Friends and Family:   . Attends Religious Services:   . Active Member of Clubs or  Organizations:   . Attends Archivist Meetings:   Marland Kitchen Marital Status:   Intimate Partner Violence:   . Fear of Current or Ex-Partner:   . Emotionally Abused:   Marland Kitchen Physically Abused:   . Sexually Abused:     Family History  Problem Relation Age of Onset  . Heart disease Father   . Hypertension Father   . Breast cancer Sister   . Cancer Sister        breast     Review of Systems  Constitutional: Negative.  Negative for chills and fever.  HENT: Negative.  Negative for congestion and sore throat.   Respiratory: Negative.  Negative for cough and shortness of breath.   Cardiovascular: Negative.  Negative for chest pain and palpitations.  Gastrointestinal: Negative.  Negative for abdominal pain, blood in stool, diarrhea, melena, nausea and vomiting.  Genitourinary: Negative.  Negative for dysuria and hematuria.  Musculoskeletal: Positive for joint pain.  Skin: Negative.  Negative for rash.  Neurological: Negative for dizziness and headaches.  All other systems reviewed and are negative.  Today's Vitals   03/14/20 0845 03/14/20 0854  BP: (!) 202/70 (!) 200/83  Pulse: 68   Temp: 98.5 F (36.9 C)   TempSrc: Temporal   SpO2: 96%   Weight: 250 lb 12.8 oz (113.8 kg)   Height: 4\' 11"  (1.499 m)    Body mass index is 50.66 kg/m. Repeat blood pressure in the room 170/70.  Physical Exam Vitals reviewed.  Constitutional:      Appearance: Normal appearance. She is obese.  HENT:     Head: Normocephalic.  Eyes:     Extraocular Movements: Extraocular movements intact.     Pupils: Pupils are equal, round, and reactive to light.  Cardiovascular:     Rate and Rhythm: Normal rate and regular rhythm.     Pulses: Normal pulses.     Heart sounds: Normal heart sounds.  Pulmonary:     Effort: Pulmonary effort is normal.     Breath sounds: Normal breath sounds.  Musculoskeletal:        General: Normal range of motion.     Cervical back: Normal range of motion and neck supple.       Right lower leg: No edema.     Left lower leg: No edema.  Skin:    General: Skin is warm and dry.     Capillary Refill: Capillary refill takes less than 2 seconds.  Neurological:     General: No focal deficit present.     Mental Status: She is alert and oriented to person, place, and time.  Psychiatric:        Mood and Affect: Mood normal.        Behavior: Behavior normal.      ASSESSMENT & PLAN: Clinically stable.  Continue present medications.  No changes.  Follow-up next month as scheduled.  Talitha was seen today for diabetes and medication management.  Diagnoses and all orders for this visit:  Hypertension associated with diabetes ([place])  Morbid obesity ([place])  Encounter to establish care  Diabetic polyneuropathy associated with type 2 diabetes mellitus ([place])  Long term (current) use of anticoagulants  Chronic diastolic heart failure ([facility])  Essential hypertension  Paroxysmal atrial fibrillation ([facility])  Obstructive sleep apnea on CPAP  History of gout    Patient Instructions       If you have lab work done today you will be contacted with your lab results within the next 2 weeks.  If you have not heard from [place] then please contact us. The fastest way to get your results is to register for My Chart.   IF you received an x-ray today, you will receive an invoice from [facility]. Please contact [facility] at [phone] with questions or concerns regarding your invoice.   IF you received labwork today, you will receive an invoice from [facility]. Please contact [facility] at [phone] with questions or concerns regarding your invoice.   Our billing staff will not be able to assist you with questions regarding bills from these companies.  You will be contacted with the lab results as soon as they are available. The fastest way to get your results is to activate your My Chart account. Instructions are located on the last page of this  paperwork. If you have not heard from [place] regarding the results in 2 weeks, please contact this office.      Hypertension, Adult High blood pressure (hypertension) is when the force of blood pumping through the arteries is too strong. The arteries are the blood vessels that carry blood from the heart throughout the body. Hypertension forces the heart to work harder to pump blood and may cause arteries to become narrow or stiff. Untreated or uncontrolled hypertension can cause a heart attack, heart failure, a stroke, kidney disease, and other problems. A blood pressure reading consists of a higher number over a lower number. Ideally, your blood pressure should be below 120/80. The first ("top") number is called the systolic pressure. It is a measure of the pressure in your arteries as your heart beats. The second ("bottom") number is called the diastolic pressure. It is a measure of the pressure in your arteries as the heart relaxes. What are the causes? The exact cause of this condition is not known. There are some conditions that result in or are related to high blood pressure. What increases the risk? Some risk factors for high blood pressure are under your control. The following factors may make you more likely to develop this condition:  Smoking.  Having type 2 diabetes mellitus, high cholesterol, or both.  Not getting enough exercise or physical activity.  Being overweight.  Having too much fat, sugar, calories, or salt (sodium) in your diet.  Drinking too much alcohol. Some risk factors for  high blood pressure may be difficult or impossible to change. Some of these factors include:  Having chronic kidney disease.  Having a family history of high blood pressure.  Age. Risk increases with age.  Race. You may be at higher risk if you are African American.  Gender. Men are at higher risk than women before age 20. After age 73, women are at higher risk than men.  Having  obstructive sleep apnea.  Stress. What are the signs or symptoms? High blood pressure may not cause symptoms. Very high blood pressure (hypertensive crisis) may cause:  Headache.  Anxiety.  Shortness of breath.  Nosebleed.  Nausea and vomiting.  Vision changes.  Severe chest pain.  Seizures. How is this diagnosed? This condition is diagnosed by measuring your blood pressure while you are seated, with your arm resting on a flat surface, your legs uncrossed, and your feet flat on the floor. The cuff of the blood pressure monitor will be placed directly against the skin of your upper arm at the level of your heart. It should be measured at least twice using the same arm. Certain conditions can cause a difference in blood pressure between your right and left arms. Certain factors can cause blood pressure readings to be lower or higher than normal for a short period of time:  When your blood pressure is higher when you are in a health care provider's office than when you are at home, this is called white coat hypertension. Most people with this condition do not need medicines.  When your blood pressure is higher at home than when you are in a health care provider's office, this is called masked hypertension. Most people with this condition may need medicines to control blood pressure. If you have a high blood pressure reading during one visit or you have normal blood pressure with other risk factors, you may be asked to:  Return on a different day to have your blood pressure checked again.  Monitor your blood pressure at home for 1 week or longer. If you are diagnosed with hypertension, you may have other blood or imaging tests to help your health care provider understand your overall risk for other conditions. How is this treated? This condition is treated by making healthy lifestyle changes, such as eating healthy foods, exercising more, and reducing your alcohol intake. Your health  care provider may prescribe medicine if lifestyle changes are not enough to get your blood pressure under control, and if:  Your systolic blood pressure is above 130.  Your diastolic blood pressure is above 80. Your personal target blood pressure may vary depending on your medical conditions, your age, and other factors. Follow these instructions at home: Eating and drinking   Eat a diet that is high in fiber and potassium, and low in sodium, added sugar, and fat. An example eating plan is called the DASH (Dietary Approaches to Stop Hypertension) diet. To eat this way: ? Eat plenty of fresh fruits and vegetables. Try to fill one half of your plate at each meal with fruits and vegetables. ? Eat whole grains, such as whole-wheat pasta, brown rice, or whole-grain bread. Fill about one fourth of your plate with whole grains. ? Eat or drink low-fat dairy products, such as skim milk or low-fat yogurt. ? Avoid fatty cuts of meat, processed or cured meats, and poultry with skin. Fill about one fourth of your plate with lean proteins, such as fish, chicken without skin, beans, eggs, or tofu. ?  Avoid pre-made and processed foods. These tend to be higher in sodium, added sugar, and fat.  Reduce your daily sodium intake. Most people with hypertension should eat less than 1,500 mg of sodium a day.  Do not drink alcohol if: ? Your health care provider tells you not to drink. ? You are pregnant, may be pregnant, or are planning to become pregnant.  If you drink alcohol: ? Limit how much you use to:  0-1 drink a day for women.  0-2 drinks a day for men. ? Be aware of how much alcohol is in your drink. In the [place] one drink equals one 12 oz bottle of beer (355 mL), one 5 oz glass of wine (148 mL), or one 1 oz glass of hard liquor (44 mL). Lifestyle   Work with your health care provider to maintain a healthy body weight or to lose weight. Ask what an ideal weight is for you.  Get at least 30  minutes of exercise most days of the week. Activities may include walking, swimming, or biking.  Include exercise to strengthen your muscles (resistance exercise), such as Pilates or lifting weights, as part of your weekly exercise routine. Try to do these types of exercises for 30 minutes at least 3 days a week.  Do not use any products that contain nicotine or tobacco, such as cigarettes, e-cigarettes, and chewing tobacco. If you need help quitting, ask your health care provider.  Monitor your blood pressure at home as told by your health care provider.  Keep all follow-up visits as told by your health care provider. This is important. Medicines  Take over-the-counter and prescription medicines only as told by your health care provider. Follow directions carefully. Blood pressure medicines must be taken as prescribed.  Do not skip doses of blood pressure medicine. Doing this puts you at risk for problems and can make the medicine less effective.  Ask your health care provider about side effects or reactions to medicines that you should watch for. Contact a health care provider if you:  Think you are having a reaction to a medicine you are taking.  Have headaches that keep coming back (recurring).  Feel dizzy.  Have swelling in your ankles.  Have trouble with your vision. Get help right away if you:  Develop a severe headache or confusion.  Have unusual weakness or numbness.  Feel faint.  Have severe pain in your chest or abdomen.  Vomit repeatedly.  Have trouble breathing. Summary  Hypertension is when the force of blood pumping through your arteries is too strong. If this condition is not controlled, it may put you at risk for serious complications.  Your personal target blood pressure may vary depending on your medical conditions, your age, and other factors. For most people, a normal blood pressure is less than 120/80.  Hypertension is treated with lifestyle  changes, medicines, or a combination of both. Lifestyle changes include losing weight, eating a healthy, low-sodium diet, exercising more, and limiting alcohol. This information is not intended to replace advice given to you by your health care provider. Make sure you discuss any questions you have with your health care provider. Document Revised: 06/16/2018 Document Reviewed: 06/16/2018 Elsevier Patient Education  2020 Elsevier Inc.    Agustina Caroli, MD [facility]

## 2020-03-14 NOTE — Telephone Encounter (Signed)
Felicia -   Not seeing any new message here.  Any actionable item available!  Kathrin Ruddy, NP

## 2020-03-16 ENCOUNTER — Other Ambulatory Visit: Payer: Self-pay | Admitting: Internal Medicine

## 2020-03-20 ENCOUNTER — Emergency Department (HOSPITAL_COMMUNITY): Payer: Medicare Other

## 2020-03-20 ENCOUNTER — Encounter (HOSPITAL_COMMUNITY): Payer: Self-pay

## 2020-03-20 ENCOUNTER — Ambulatory Visit: Payer: Self-pay | Admitting: *Deleted

## 2020-03-20 ENCOUNTER — Other Ambulatory Visit: Payer: Self-pay

## 2020-03-20 ENCOUNTER — Emergency Department (HOSPITAL_COMMUNITY)
Admission: EM | Admit: 2020-03-20 | Discharge: 2020-03-20 | Disposition: A | Discharge status: Home / Self Care | Payer: Medicare Other | Attending: Emergency Medicine | Admitting: Emergency Medicine

## 2020-03-20 DIAGNOSIS — Z7901 Long term (current) use of anticoagulants: Secondary | ICD-10-CM | POA: Insufficient documentation

## 2020-03-20 DIAGNOSIS — R109 Unspecified abdominal pain: Secondary | ICD-10-CM | POA: Diagnosis not present

## 2020-03-20 DIAGNOSIS — Z794 Long term (current) use of insulin: Secondary | ICD-10-CM | POA: Insufficient documentation

## 2020-03-20 DIAGNOSIS — E119 Type 2 diabetes mellitus without complications: Secondary | ICD-10-CM | POA: Insufficient documentation

## 2020-03-20 DIAGNOSIS — I5031 Acute diastolic (congestive) heart failure: Secondary | ICD-10-CM | POA: Insufficient documentation

## 2020-03-20 DIAGNOSIS — I480 Paroxysmal atrial fibrillation: Secondary | ICD-10-CM | POA: Insufficient documentation

## 2020-03-20 DIAGNOSIS — R197 Diarrhea, unspecified: Secondary | ICD-10-CM

## 2020-03-20 DIAGNOSIS — Z79899 Other long term (current) drug therapy: Secondary | ICD-10-CM | POA: Insufficient documentation

## 2020-03-20 DIAGNOSIS — I10 Essential (primary) hypertension: Secondary | ICD-10-CM | POA: Insufficient documentation

## 2020-03-20 DIAGNOSIS — R1011 Right upper quadrant pain: Secondary | ICD-10-CM | POA: Insufficient documentation

## 2020-03-20 LAB — COMPREHENSIVE METABOLIC PANEL
ALT: 18 U/L (ref 0–44)
AST: 19 U/L (ref 15–41)
Albumin: 3.6 g/dL (ref 3.5–5.0)
Alkaline Phosphatase: 109 U/L (ref 38–126)
Anion gap: 11 (ref 5–15)
BUN: 17 mg/dL (ref 8–23)
CO2: 30 mmol/L (ref 22–32)
Calcium: 8.9 mg/dL (ref 8.9–10.3)
Chloride: 100 mmol/L (ref 98–111)
Creatinine, Ser: 0.75 mg/dL (ref 0.44–1.00)
GFR calc Af Amer: >60 mL/min (ref >60)
GFR calc non Af Amer: >60 mL/min (ref >60)
Glucose, Bld: 143 mg/dL — ABNORMAL HIGH (ref 70–99)
Potassium: 3.4 mmol/L — ABNORMAL LOW (ref 3.5–5.1)
Sodium: 141 mmol/L (ref 135–145)
Total Bilirubin: 0.8 mg/dL (ref 0.3–1.2)
Total Protein: 7.2 g/dL (ref 6.5–8.1)

## 2020-03-20 LAB — C DIFFICILE QUICK SCREEN W PCR REFLEX
C Diff antigen: NEGATIVE
C Diff interpretation: NOT DETECTED
C Diff toxin: NEGATIVE

## 2020-03-20 LAB — CBC
HCT: 41.1 % (ref 36.0–46.0)
Hemoglobin: 12.7 g/dL (ref 12.0–15.0)
MCH: 26.8 pg (ref 26.0–34.0)
MCHC: 30.9 g/dL (ref 30.0–36.0)
MCV: 86.9 fL (ref 80.0–100.0)
Platelets: 324 10*3/uL (ref 150–400)
RBC: 4.73 MIL/uL (ref 3.87–5.11)
RDW: 15.8 % — ABNORMAL HIGH (ref 11.5–15.5)
WBC: 12.6 10*3/uL — ABNORMAL HIGH (ref 4.0–10.5)
nRBC: 0 % (ref 0.0–0.2)

## 2020-03-20 LAB — LIPASE, BLOOD: Lipase: 28 U/L (ref 11–51)

## 2020-03-20 LAB — CBG MONITORING, ED: Glucose-Capillary: 126 mg/dL — ABNORMAL HIGH (ref 70–99)

## 2020-03-20 MED ORDER — SODIUM CHLORIDE (PF) 0.9 % IJ SOLN
INTRAMUSCULAR | Status: AC
  Filled 2020-03-20: qty 50

## 2020-03-20 MED ORDER — IOHEXOL 300 MG/ML  SOLN
100.0000 mL | Freq: Once | INTRAMUSCULAR | Status: AC | PRN
  Administered 2020-03-20: 100 mL via INTRAVENOUS

## 2020-03-20 MED ORDER — SODIUM CHLORIDE 0.9% FLUSH: 3.0000 mL | Freq: Once | INTRAVENOUS | Status: DC

## 2020-03-20 MED ORDER — LOPERAMIDE HCL 2 MG PO CAPS
2.0000 mg | ORAL_CAPSULE | Freq: Four times a day (QID) | ORAL | 0 refills | Status: DC | PRN
Start: 2020-03-20 — End: 2021-12-17

## 2020-03-20 MED ORDER — SODIUM CHLORIDE 0.9 % IV BOLUS
1000.0000 mL | Freq: Once | INTRAVENOUS | Status: AC
  Administered 2020-03-20: 1000 mL via INTRAVENOUS

## 2020-03-20 NOTE — Discharge Instructions (Addendum)
Continue to take Imodium for your symptoms.  Please make sure to drink lots of fluids and start with a bland food diet slowly start to introduce foods.  We did a stool culture but this will take at least a day or 2 to return.  If this is positive, you will receive a call.  Return to the ER if your symptoms worsen.  Overall today your work-up was very reassuring.  There was a small benign lesion on your kidneys noted on your CT scan, these follow-up with your primary care provider about this.

## 2020-03-20 NOTE — ED Triage Notes (Signed)
Patient states she has had abdominal pain and diarrhea x 4 days.

## 2020-03-20 NOTE — ED Notes (Addendum)
Pt's blood sugar was 81 on her personal monitor, pt given drink sandwich and crackers per Dr.Tegler

## 2020-03-20 NOTE — ED Provider Notes (Addendum)
[facility] DEPT Provider Note   CSN: [id number] Arrival date & time: 03/20/20  1112     History Chief Complaint  Patient presents with  . Abdominal Pain  . Diarrhea    [name] is a 79 y.o. female.  HPI 79 year old female with a history of hypertension, DM type II,, paroxysmal A. fib on warfarin, CKD diastolic CHF, hyperlipidemia obstructive sleep apnea presents to the ER with a 4-day history of abdominal pain and diarrhea.  Patient states that she has had a gradual onset of watery foul-smelling diarrhea for the last 4 days.  She states that this morning she woke up and felt a little bit dizzy, so decided to call EMS.  When EMS arrived, they stated her vitals were okay and the diarrhea is not an emergency she also reports intermittent abdominal pain.  She has tried Imodium with little relief, states she has been eating mostly soups.  No other sick contacts, no one else in the household has diarrhea.  No recent antibiotics.  She states that she does not have any blood in her diarrhea, though she does have a history of hemorrhoids and will sometimes see occasional blood on her toilet paper.  However this is not new.  She denies any fevers, nausea, vomiting, chest pain, headache, back pain, dysuria, hematuria, constipation, vaginal pain, vaginal bleeding.    Past Medical History:  Diagnosis Date  . Arthritis   . Back pain   . Chronic anticoagulation    due to aflutter  . Chronic kidney disease   . Diabetes mellitus   . Diastolic CHF, chronic ([place])    a.  echo 2006 - ef 55-65%; mild diast dysfxn;    b. Echo 08/2011: Mild LVH, EF 60%;  c. 04/2013 Echo: EF 65-69%, mild conc LVH;  08/2014 Echo: EF 60-65%, mild-mod MR.  . Gout   . Hyperlipidemia   . Hypertension    a.  Renal arterial Dopplers 12/2011: 1-59% right renal artery stenosis  . Morbid obesity ([place])   . Obstructive sleep apnea on CPAP   . Paroxysmal Afib/Flutter    a. dccv: 08/2011 - on  amiodarone/coumadin    Patient Active Problem List   Diagnosis Date Noted  . Obesity 12/19/2015  . Hypersomnia 12/19/2015  . Acute diastolic congestive heart failure, NYHA class 3 ([place]) 10/03/2013  . Prolonged QT interval 10/03/2013  . Acute diastolic heart failure ([place]) 10/03/2013  . Long term (current) use of anticoagulants 06/09/2012  . Sinus bradycardia 10/01/2011  . Back pain   . BENIGN NEOPLASM OF ADRENAL GLAND 11/25/2010  . Chronic diastolic heart failure ([place]) 02/20/2010  . DM 05/16/2009  . GOUT 05/16/2009  . OBESITY, MORBID 05/16/2009  . Essential hypertension 05/16/2009  . Paroxysmal atrial fibrillation ([place]) 05/16/2009  . HYPERLIPIDEMIA 11/30/2008  . Obstructive sleep apnea on CPAP 11/30/2008    Past Surgical History:  Procedure Laterality Date  . APPENDECTOMY    . ATRIAL FLUTTER ABLATION N/A 09/24/2011   Procedure: ATRIAL FLUTTER ABLATION;  Surgeon: [name], MD;  Location: [facility] CATH LAB;  Service: Cardiovascular;  Laterality: N/A;  . CARDIOVERSION  10/22/2011   Procedure: CARDIOVERSION;  Surgeon: [name], MD;  Location: [facility];  Service: Cardiovascular;  Laterality: N/A;  . CARDIOVERSION N/A 09/10/2011   Procedure: CARDIOVERSION;  Surgeon: [name], MD;  Location: [facility] CATH LAB;  Service: Cardiovascular;  Laterality: N/A;  . CHOLECYSTECTOMY    . TONSILLECTOMY  1982  . TOTAL ABDOMINAL HYSTERECTOMY  OB History   No obstetric history on file.     Family History  Problem Relation Age of Onset  . Heart disease Father   . Hypertension Father   . Breast cancer Sister   . Cancer Sister        breast    Social History   Tobacco Use  . Smoking status: Never Smoker  . Smokeless tobacco: Never Used  Substance Use Topics  . Alcohol use: No  . Drug use: No    Home Medications Prior to Admission medications   Medication Sig Start Date End Date Taking? Authorizing Provider  allopurinol (ZYLOPRIM) 300 MG tablet Take 300 mg by mouth daily.   07/07/13  Yes [provider]  amiodarone (PACERONE) 200 MG tablet TAKE 1 TABLET BY MOUTH EVERY DAY. [place] ONLY 03/16/20  Yes [name], MD  cloNIDine (CATAPRES) 0.2 MG tablet Take 0.2 mg by mouth 2 (two) times daily.   Yes [provider]  diltiazem (CARDIZEM CD) 120 MG 24 hr capsule Take one capsule daily; may take twice a day during afib episode 10/17/19  Yes [name], MD  Insulin Lispro Prot & Lispro (HUMALOG 75/25 MIX) (75-25) 100 UNIT/ML Kwikpen Inject 0-56 Units into the skin 3 (three) times daily as needed (high blood sugar). Sliding Scale 08/12/16  Yes [provider]  levothyroxine (SYNTHROID, LEVOTHROID) 75 MCG tablet Take 75 mcg daily before breakfast by mouth.  12/29/16  Yes [provider]  lisinopril (ZESTRIL) 40 MG tablet Take 60 mg by mouth daily.  12/02/19  Yes [provider]  potassium chloride (KLOR-CON) 10 MEQ tablet Take 2 tablets (20 mEq total) by mouth daily. 10/18/19  Yes [name], MD  pregabalin (LYRICA) 25 MG capsule Take 1 capsule (25 mg total) by mouth 2 (two) times daily. 03/07/20  Yes Maximiano Coss, NP  RESTASIS MULTIDOSE 0.05 % ophthalmic emulsion Place 1 drop into both eyes 2 (two) times daily.  03/04/20  Yes [provider]  rosuvastatin (CRESTOR) 10 MG tablet Take 10 mg by mouth every evening.   Yes [provider]  Semaglutide,0.25 or 0.5MG /DOS, (OZEMPIC, 0.25 OR 0.5 MG/DOSE,) 2 MG/1.5ML SOPN Inject 0.4 mLs into the skin once a week. 05/17/19  Yes [provider]  torsemide (DEMADEX) 20 MG tablet Take 2 tablets (40 mg total) by mouth daily. 09/29/19  Yes [name], MD  warfarin (COUMADIN) 5 MG tablet TAKE 1/2 TABLET BY MOUTH ON MONDAY, WEDNESDAY AND FRIDAY AND 1 TABLET BY MOUTH ON ALL OTHER DAYS AS DIRECTED BY COUMADIN CLINIC Patient taking differently: Take 2.5-5 mg by mouth as directed. TAKE 1/2 TABLET (2.5 MG) BY MOUTH ON MONDAY, WEDNESDAY AND FRIDAY AND 1  TABLET BY MOUTH ON ALL OTHER DAYS AS DIRECTED BY COUMADIN CLINIC 02/06/20  Yes [name], MD  Accu-Chek Softclix Lancets lancets 3 (three) times daily. as directed 09/27/19   [provider]  gabapentin (NEURONTIN) 300 MG capsule Take 1 capsule (300 mg total) by mouth 3 (three) times daily. Patient not taking: Reported on 03/14/2020 02/28/20   Maximiano Coss, NP  isosorbide mononitrate (IMDUR) 30 MG 24 hr tablet Take 0.5 tablets (15 mg total) by mouth daily. Patient not taking: Reported on 03/14/2020 02/21/20   Shirley Friar, PA-C  loperamide (IMODIUM) 2 MG capsule Take 1 capsule (2 mg total) by mouth 4 (four) times daily as needed for diarrhea or loose stools. 03/20/20   Garald Balding, PA-C  NOVOFINE  PLUS 32G X 4 MM MISC 2 (two) times daily. as directed 10/15/19   [provider]    Allergies    Metformin  Review of Systems   Review of Systems  Constitutional: Negative for chills and fever.  HENT: Negative for ear pain and sore throat.   Eyes: Negative for pain and visual disturbance.  Respiratory: Negative for cough and shortness of breath.   Cardiovascular: Negative for chest pain and palpitations.  Gastrointestinal: Positive for abdominal pain and diarrhea. Negative for blood in stool and vomiting.  Genitourinary: Negative for decreased urine volume, difficulty urinating, dysuria, hematuria, vaginal bleeding and vaginal discharge.  Musculoskeletal: Negative for arthralgias and back pain.  Skin: Negative for color change and rash.  Neurological: Negative for seizures and syncope.  Psychiatric/Behavioral: Negative for confusion.  All other systems reviewed and are negative.   Physical Exam Updated Vital Signs BP (!) 146/66 (BP Location: Left Wrist)   Pulse 62   Temp 97.8 F (36.6 C) (Oral)   Resp 15   Ht 5\' 1"  (1.549 m)   Wt 113.4 kg   SpO2 100%   BMI 47.24 kg/m   Physical Exam Vitals and nursing note reviewed.  Constitutional:      General:  She is not in acute distress.    Appearance: She is well-developed. She is not ill-appearing, toxic-appearing or diaphoretic.  HENT:     Head: Normocephalic and atraumatic.     Mouth/Throat:     Mouth: Mucous membranes are moist.     Pharynx: Oropharynx is clear.  Eyes:     Conjunctiva/sclera: Conjunctivae normal.  Cardiovascular:     Rate and Rhythm: Normal rate and regular rhythm.     Heart sounds: Normal heart sounds. No murmur.  Pulmonary:     Effort: Pulmonary effort is normal. No respiratory distress.     Breath sounds: Normal breath sounds.  Abdominal:     General: Abdomen is flat. Bowel sounds are increased. There is no distension.     Palpations: Abdomen is soft.     Tenderness: There is generalized abdominal tenderness and tenderness in the right upper quadrant. There is no right CVA tenderness or left CVA tenderness. Negative signs include Murphy's sign, McBurney's sign and obturator sign.     Hernia: No hernia is present.  Musculoskeletal:     Cervical back: Neck supple.  Skin:    General: Skin is warm and dry.     Capillary Refill: Capillary refill takes less than 2 seconds.  Neurological:     General: No focal deficit present.     Mental Status: She is alert.     Cranial Nerves: No cranial nerve deficit.     Motor: No weakness.  Psychiatric:        Mood and Affect: Mood normal.        Behavior: Behavior normal.     ED Results / Procedures / Treatments   Labs (all labs ordered are listed, but only abnormal results are displayed) Labs Reviewed  COMPREHENSIVE METABOLIC PANEL - Abnormal; Notable for the following components:      Result Value   Potassium 3.4 (*)    Glucose, Bld 143 (*)    All other components within normal limits  CBC - Abnormal; Notable for the following components:   WBC 12.6 (*)    RDW 15.8 (*)    All other components within normal limits  CBG MONITORING, ED - Abnormal; Notable for the following components:   Glucose-Capillary 126 (*)  All other components within normal limits  C DIFFICILE QUICK SCREEN W PCR REFLEX  GASTROINTESTINAL PANEL BY PCR, STOOL (REPLACES STOOL CULTURE)  LIPASE, BLOOD  URINALYSIS, ROUTINE W REFLEX MICROSCOPIC    EKG EKG Interpretation  Date/Time:  Tuesday March 20 2020 16:53:03 EDT Ventricular Rate:  64 PR Interval:    QRS Duration: 93 QT Interval:  480 QTC Calculation: 496 R Axis:   47 Text Interpretation: Sinus or ectopic atrial rhythm Prolonged PR interval Low voltage, precordial leads Borderline T abnormalities, anterior leads Borderline prolonged QT interval No significant change since last tracing Confirmed by Calvert Cantor [phone]) on 03/20/2020 5:02:59 PM   Radiology CT ABDOMEN PELVIS W CONTRAST  Result Date: 03/20/2020 CLINICAL DATA:  Right upper quadrant abdominal pain. Diarrhea for 4 days. EXAM: CT ABDOMEN AND PELVIS WITH CONTRAST TECHNIQUE: Multidetector CT imaging of the abdomen and pelvis was performed using the standard protocol following bolus administration of intravenous contrast. CONTRAST:  125mL OMNIPAQUE IOHEXOL 300 MG/ML  SOLN COMPARISON:  10/16/2014. FINDINGS: Lower chest: Clear lung bases. Mild cardiomegaly, without pericardial or pleural effusion. Hepatobiliary: Hepatomegaly at 19.2 cm craniocaudal. Cholecystectomy, without biliary ductal dilatation. Pancreas: Normal, without mass or ductal dilatation. Spleen: Normal in size, without focal abnormality. Adrenals/Urinary Tract: Normal left adrenal gland. Right adrenal mass of 4.2 x 3.0 cm, similar in 2015 consistent with a benign etiology. Normal kidneys, without hydronephrosis. Normal urinary bladder. Stomach/Bowel: Normal stomach, without wall thickening. Normal colon and terminal ileum. Normal small bowel. Vascular/Lymphatic: Aortic atherosclerosis. No abdominopelvic adenopathy. Reproductive: Hysterectomy. No adnexal mass. 1.7 cm left adnexal hyperattenuation is likely residual ovarian tissue and and similar to 2015.  Other: No significant free fluid. Mild pelvic floor laxity. Fat containing right abdominal wall hernia including on 26/2. Contiguous or adjacent more inferior right-sided abdominal wall hernia containing fat on 32/2. Musculoskeletal: No acute osseous abnormality. IMPRESSION: 1. No acute process in the abdomen or pelvis. 2. 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 3. Fat containing right abdominal wall hernia(s). 4. Aortic Atherosclerosis (ICD10-I70.0). Electronically Signed   By: Emaline Miyamoto M.D.   On: 03/20/2020 17:52    Procedures Procedures (including critical care time)  Medications Ordered in ED Medications  sodium chloride flush (NS) 0.9 % injection 3 mL (3 mLs Intravenous Not Given 03/20/20 1424)  sodium chloride (PF) 0.9 % injection (has no administration in time range)  sodium chloride 0.9 % bolus 1,000 mL (1,000 mLs Intravenous New Bag/Given 03/20/20 1701)  iohexol (OMNIPAQUE) 300 MG/ML solution 100 mL (100 mLs Intravenous Contrast Given 03/20/20 1722)    ED Course  I have reviewed the triage vital signs and the nursing notes.  Pertinent labs & imaging results that were available during my care of the patient were reviewed by me and considered in my medical decision making (see chart for details).    MDM Rules/Calculators/A&P                     79 year old with diarrhea x4 days. On presentation to the ER, the patient is alert and oriented, nontoxic-appearing, speaking full sentences, in no acute distress.  Physical exam with some diffuse and localized right upper quadrant tenderness.  No recent imaging done.  Vitals overall reassuring, she is normotensive, sats 99%.  She is overall well-appearing.  CMP with mildly decreased potassium of 3.4, normal creatinine and liver enzymes.  CBC with mild leukocytosis of 12.6, hemoglobin normal.  Lipase normal.  DDx includes C. difficile, diverticulitis, IBS, bacterial diarrhea. We will  start with CT scan, and stool PCR with C.  difficile rule out.  5:37 PM: C. difficile negative.  EKG unchanged from previous.  6:01 PM: CT without acute intra-abdominal process.  There was a benign renal mass noted on her right kidney, I educated the patient to follow-up with her PCP for this.  CT did note a hernia, on abdominal reexamination I did not note a visible hernia no evidence of strangulation. Serial abdominal examination shows improvement in abdominal pain.   Will write her prescription for Imodium, bland food diet not given.  She is overall reassured by the work-up.  She is overall well-appearing.  Stool culture pending.  Return precautions given.  She voices understanding and is agreeable to this plan.  I will provide a pneumonia prescription, discussed her borderline long QT with Dr. Karle Starch, he thinks it would be safe to give this medication to her.  At this stage in the ED course, the patient has been appropriately medically screened and is stable for discharge.   Final Clinical Impression(s) / ED Diagnoses Final diagnoses:  Diarrhea, unspecified type    Rx / DC Orders ED Discharge Orders         Ordered    loperamide (IMODIUM) 2 MG capsule  4 times daily PRN     03/20/20 1801               Garald Balding, PA-C 03/20/20 1805    Tegeler, Gwenyth Allegra, MD 03/22/20 [phone]

## 2020-03-20 NOTE — Telephone Encounter (Signed)
Pt called in c/o severe diarrhea that started Saturday about 1:00 PM after eating cereal with almond milk earlier at her sister's house.  She is having numerous incontinent episodes of very watery diarrhea. "My husband has had to clean up the messes".   "I can't get off the toilet".  "I feel so sleepy, tired and weak". She is drinking Pedialyte and some other fluids.   I instructed her to keep drinking the fluids.   She also c/o be a little dizzy.  She is an insulin dependent diabetic also.     She called EMS this morning.   They came to her house but did not transport her to the ED because "diarrhea is not an emergency".  "My VS were fine". "I can't stand this anymore".   "I'm burning back there so bad it's like someone is pouring alcohol on me".  "I've used 13 large rolls of toilet paper".   "Please help me" She is crying.  I let her know she needs to go to the ED.  She was agreeable to going even though she really did not want to because of the incontinence.  Her husband is at work and can't get off or he has to not be paid.   He missed work yesterday so we can't afford for him to miss another day of work.   I asked if she had someone to take her she could call.   When I recommended EMS she replied,   "I have 3 ambulance bills".   "I can't afford to call them unless I really have to". She was agreeable to calling 911 if she wasn't able to get someone to take her.  She verbalized the understanding of the importance of going to the ED since she has diabetes, several days of severe stools, weakness and dizziness.   "I usually go to [facility] but I might go to [facility] this time".  I let her know either hospital was fine.    Per protocol I've referred her to the ED and she is agreeable to going.    Reason for Disposition  [1] Drinking very little AND [2] dehydration suspected (e.g., no urine > 12 hours, very dry mouth, very lightheaded)  Answer Assessment - Initial Assessment Questions 1. DIARRHEA  SEVERITY: "How bad is the diarrhea?" "How many extra stools have you had in the past 24 hours than normal?"    - NO DIARRHEA (SCALE 0) I'm having diarrhea so bad.  It started Saturday.   I called EMS this morning because I was getting dizzy.  The burning is bad.   They did not take me to the hospital because diarrhea is "not an emergency".     - MILD (SCALE 1-3): Few loose or mushy BMs; increase of 1-3 stools over normal daily number of stools; mild increase in ostomy output.   -  MODERATE (SCALE 4-7): Increase of 4-6 stools daily over normal; moderate increase in ostomy output. * SEVERE (SCALE 8-10; OR 'WORST POSSIBLE'): Increase of 7 or more stools daily over normal; moderate increase in ostomy output; incontinence.     Severe Can't get off the toilet.   I'm weak. 2. ONSET: "When did the diarrhea begin?"      Saturday been non stop since then. 3. BM CONSISTENCY: "How loose or watery is the diarrhea?"      Very watery 4. VOMITING: "Are you also vomiting?" If so, ask: "How many times in the past 24 hours?"  No 5. ABDOMINAL PAIN: "Are you having any abdominal pain?" If yes: "What does it feel like?" (e.g., crampy, dull, intermittent, constant)      Saturday and Sunday had abd pains. I'm a new pt at [facility].   I have a knot on right side of my stomach.   I'm for a ultrasound.   I'm waiting for them to call.  (It's been Memorial Day weekend). 6. ABDOMINAL PAIN SEVERITY: If present, ask: "How bad is the pain?"  (e.g., Scale 1-10; mild, moderate, or severe)   - MILD (1-3): doesn't interfere with normal activities, abdomen soft and not tender to touch    - MODERATE (4-7): interferes with normal activities or awakens from sleep, tender to touch    - SEVERE (8-10): excruciating pain, doubled over, unable to do any normal activities       Cramps Saturday and Sunday but not now. 7. ORAL INTAKE: If vomiting, "Have you been able to drink liquids?" "How much fluids have you had in the past  24 hours?"     I'm drinking the Pedialyte and water. I've taken Imodium but it doesn't seem to help.  My last dose was yesterday.  It helped for 2 hours. My anus is burning  So bad.   I cry when I go to the toilet. 8. HYDRATION: "Any signs of dehydration?" (e.g., dry mouth [not just dry lips], too weak to stand, dizziness, new weight loss) "When did you last urinate?"     Dizzy. IDDM.     Not as dizzy now but I'm so sleepy now.  I've had so many accidents getting to the toilet.  My husband has been cleaning the mess on the floor. 9. EXPOSURE: "Have you traveled to a foreign country recently?" "Have you been exposed to anyone with diarrhea?" "Could you have eaten any food that was spoiled?"     No exposures.  No traveling.     Saturday my sister called me 5 am.  I went to my sister's and no one could get in.  Police and fire department came.   They found my sister in a diabetic coma.   I had to eat after that.   I had cereal with almond milk from my sister's.  When I got home about 1:00 PM an hour later I started with the diarrhea.   10. ANTIBIOTIC USE: "Are you taking antibiotics now or have you taken antibiotics in the past 2 months?"       No antibiotics.    I had soup yesterday my husband made.   11. OTHER SYMPTOMS: "Do you have any other symptoms?" (e.g., fever, blood in stool)       I saw blood but I don't know if it's from wiping.   I've used 13 rolls of large rolls of toilet paper.   I can't hardly wipe. 12. PREGNANCY: "Is there any chance you are pregnant?" "When was your last menstrual period?"       N/A  Protocols used: DIARRHEA-A-AH

## 2020-03-21 ENCOUNTER — Telehealth: Payer: Self-pay | Admitting: Emergency Medicine

## 2020-03-21 LAB — GASTROINTESTINAL PANEL BY PCR, STOOL (REPLACES STOOL CULTURE)

## 2020-03-21 NOTE — Telephone Encounter (Signed)
Called husbands phone was able to schedule HFU

## 2020-03-21 NOTE — Telephone Encounter (Signed)
Called pt to schedule HFU per  CRM from [facility] at [facility] . Phone rings and cuts off . No way to contact pt

## 2020-03-23 NOTE — Progress Notes (Deleted)
Name: [name]  MRN/ DOB: [id number], 06-16-1941   Age/ Sex: 79 y.o., female    PCP: [name] Agee, NP   Reason for Endocrinology Evaluation: Type 2 Diabetes Mellitus     Date of Initial Endocrinology Visit: 03/26/2020     PATIENT IDENTIFIER: [name] is a 79 y.o. female with a past medical history of T2Dm, HTN  , PAF and CHF. The patient presented for initial endocrinology clinic visit on 03/26/2020 for consultative assistance with her diabetes management.    HPI: [name] was    Diagnosed with DM *** Prior Medications tried/Intolerance: *** Currently checking blood sugars *** x / day,  before breakfast and ***.  Hypoglycemia episodes : ***               Symptoms: ***                 Frequency: ***/  Hemoglobin A1c has ranged from 7.3% in 2011, peaking at 9.5% in 2014. Patient required assistance for hypoglycemia:  Patient has required hospitalization within the last 1 year from hyper or hypoglycemia:   In terms of diet, the patient ***   HOME DIABETES REGIMEN: Humalog Mix  Ozempic    Statin: yes ACE-I/ARB: yes Prior Diabetic Education: {Yes/No:11203}   METER DOWNLOAD SUMMARY: Date range evaluated: *** Fingerstick Blood Glucose Tests = *** Average Number Tests/Day = *** Overall Mean FS Glucose = *** Standard Deviation = ***  BG Ranges: Low = *** High = ***   Hypoglycemic Events/30 Days: BG < 50 = *** Episodes of symptomatic severe hypoglycemia = ***   DIABETIC COMPLICATIONS: Microvascular complications:   Neuropathy  Denies: ***  Last eye exam: Completed   Macrovascular complications:   ***  Denies: CAD, PVD, CVA   PAST HISTORY: Past Medical History:  Past Medical History:  Diagnosis Date  . Arthritis   . Back pain   . Chronic anticoagulation    due to aflutter  . Chronic kidney disease   . Diabetes mellitus   . Diastolic CHF, chronic (HCC)    a.  echo 2006 - ef 55-65%; mild diast dysfxn;    b. Echo 08/2011: Mild  LVH, EF 60%;  c. 04/2013 Echo: EF 65-69%, mild conc LVH;  08/2014 Echo: EF 60-65%, mild-mod MR.  . Gout   . Hyperlipidemia   . Hypertension    a.  Renal arterial Dopplers 12/2011: 1-59% right renal artery stenosis  . Morbid obesity (HCC)   . Obstructive sleep apnea on CPAP   . Paroxysmal Afib/Flutter    a. 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   Past Surgical History:  Past Surgical History:  Procedure Laterality Date  . APPENDECTOMY    . ATRIAL FLUTTER ABLATION N/A 09/24/2011   Procedure: ATRIAL FLUTTER ABLATION;  Surgeon: Marinus Maw, MD;  Location: [facility] CATH LAB;  Service: Cardiovascular;  Laterality: N/A;  . CARDIOVERSION  10/22/2011   Procedure: CARDIOVERSION;  Surgeon: Duke Salvia, MD;  Location: [facility] OR;  Service: Cardiovascular;  Laterality: N/A;  . CARDIOVERSION N/A 09/10/2011   Procedure: CARDIOVERSION;  Surgeon: Duke Salvia, MD;  Location: [facility] CATH LAB;  Service: Cardiovascular;  Laterality: N/A;  . CHOLECYSTECTOMY    . TONSILLECTOMY  1982  . TOTAL ABDOMINAL HYSTERECTOMY        Social History:  reports that she has never smoked. She has never used smokeless tobacco. She reports that she does not drink alcohol or use drugs. Family History:  Family History  Problem  Relation Age of Onset  . Heart disease Father   . Hypertension Father   . Breast cancer Sister   . Cancer Sister        breast     HOME MEDICATIONS: Allergies as of 03/26/2020      Reactions   Metformin Diarrhea      Medication List       Accurate as of March 26, 2020  7:18 AM. If you have any questions, ask your nurse or doctor.        Accu-Chek Softclix Lancets lancets 3 (three) times daily. as directed   allopurinol 300 MG tablet Commonly known as: ZYLOPRIM Take 300 mg by mouth daily.   amiodarone 200 MG tablet Commonly known as: PACERONE TAKE 1 TABLET BY MOUTH EVERY DAY. MONDAY THROUGH FRIDAY ONLY   cloNIDine 0.2 MG tablet Commonly known as: CATAPRES Take 0.2 mg by mouth 2 (two)  times daily.   diltiazem 120 MG 24 hr capsule Commonly known as: CARDIZEM CD Take one capsule daily; may take twice a day during afib episode   gabapentin 300 MG capsule Commonly known as: NEURONTIN Take 1 capsule (300 mg total) by mouth 3 (three) times daily.   Insulin Lispro Prot & Lispro (75-25) 100 UNIT/ML Kwikpen Commonly known as: HUMALOG 75/25 MIX Inject 0-56 Units into the skin 3 (three) times daily as needed (high blood sugar). Sliding Scale   isosorbide mononitrate 30 MG 24 hr tablet Commonly known as: IMDUR Take 0.5 tablets (15 mg total) by mouth daily.   levothyroxine 75 MCG tablet Commonly known as: SYNTHROID Take 75 mcg daily before breakfast by mouth.   lisinopril 40 MG tablet Commonly known as: ZESTRIL Take 60 mg by mouth daily.   loperamide 2 MG capsule Commonly known as: IMODIUM Take 1 capsule (2 mg total) by mouth 4 (four) times daily as needed for diarrhea or loose stools.   NovoFine Plus 32G X 4 MM Misc Generic drug: Insulin Pen Needle 2 (two) times daily. as directed   Ozempic (0.25 or 0.5 MG/DOSE) 2 MG/1.5ML Sopn Generic drug: Semaglutide(0.25 or 0.5MG /DOS) Inject 0.4 mLs into the skin once a week.   potassium chloride 10 MEQ tablet Commonly known as: KLOR-CON Take 2 tablets (20 mEq total) by mouth daily.   pregabalin 25 MG capsule Commonly known as: Lyrica Take 1 capsule (25 mg total) by mouth 2 (two) times daily.   Restasis MultiDose 0.05 % ophthalmic emulsion Generic drug: cycloSPORINE Place 1 drop into both eyes 2 (two) times daily.   rosuvastatin 10 MG tablet Commonly known as: CRESTOR Take 10 mg by mouth every evening.   torsemide 20 MG tablet Commonly known as: DEMADEX Take 2 tablets (40 mg total) by mouth daily.   warfarin 5 MG tablet Commonly known as: COUMADIN Take as directed by the anticoagulation clinic. If you are unsure how to take this medication, talk to your nurse or doctor. Original instructions: TAKE 1/2 TABLET BY  MOUTH ON MONDAY, WEDNESDAY AND FRIDAY AND 1 TABLET BY MOUTH ON ALL OTHER DAYS AS DIRECTED BY COUMADIN CLINIC What changed:   how much to take  how to take this  when to take this  additional instructions        ALLERGIES: Allergies  Allergen Reactions  . Metformin Diarrhea     REVIEW OF SYSTEMS: A comprehensive ROS was conducted with the patient and is negative except as per HPI and below:  ROS    OBJECTIVE:   VITAL SIGNS: There were  no vitals taken for this visit.   PHYSICAL EXAM:  General: Pt appears well and is in NAD  Hydration: Well-hydrated with moist mucous membranes and good skin turgor  HEENT: Head: Unremarkable with good dentition. Oropharynx clear without exudate.  Eyes: External eye exam normal without stare, lid lag or exophthalmos.  EOM intact.  PERRL.  Neck: General: Supple without adenopathy or carotid bruits. Thyroid: Thyroid size normal.  No goiter or nodules appreciated. No thyroid bruit.  Lungs: Clear with good BS bilat with no rales, rhonchi, or wheezes  Heart: RRR with normal S1 and S2 and no gallops; no murmurs; no rub  Abdomen: Normoactive bowel sounds, soft, nontender, without masses or organomegaly palpable  Extremities:  Lower extremities - No pretibial edema. No lesions.  Skin: Normal texture and temperature to palpation. No rash noted. No Acanthosis nigricans/skin tags. No lipohypertrophy.  Neuro: MS is good with appropriate affect, pt is alert and Ox3    DM foot exam:    DATA REVIEWED:  Lab Results  Component Value Date   HGBA1C 7.8 (A) 02/28/2020   HGBA1C 9.5 (H) 10/03/2013   HGBA1C 8.1 (H) 04/05/2013   Lab Results  Component Value Date   MICROALBUR 2.48 (H) 08/21/2009   LDLCALC 52 02/28/2020   CREATININE 0.75 03/20/2020   No results found for: MICRALBCREAT  Lab Results  Component Value Date   CHOL 146 02/28/2020   HDL 58 02/28/2020   LDLCALC 52 02/28/2020   TRIG 226 (H) 02/28/2020   CHOLHDL 2.5 02/28/2020         ASSESSMENT / PLAN / RECOMMENDATIONS:   1) Type *** Diabetes Mellitus, ***controlled, With*** complications - Most recent A1c of *** %. Goal A1c < *** %.  ***  Plan: GENERAL:  ***  MEDICATIONS:  ***  EDUCATION / INSTRUCTIONS:  BG monitoring instructions: Patient is instructed to check her blood sugars *** times a day, ***.  Call [facility] if: BG persistently < 70 or > 300. . I reviewed the Rule of 15 for the treatment of hypoglycemia in detail with the patient. Literature supplied.   2) Diabetic complications:   Eye: Does *** have known diabetic retinopathy.   Neuro/ Feet: Does *** have known diabetic peripheral neuropathy.  Renal: Patient does *** have known baseline CKD. She is *** on an ACEI/ARB at present.Check urine albumin/creatinine ratio yearly starting at time of diagnosis. If albuminuria is positive, treatment is geared toward better glucose, blood pressure control and use of ACE inhibitors or ARBs. Monitor electrolytes and creatinine once to twice yearly.   3) Lipids: Patient is *** on a statin.    4) Hypertension: ***  at goal of < 140/90 mmHg.       Signed electronically by: Lyndle Herrlich, MD  [facility]  [facility] [address] [place], [place] [address] Phone: [phone] FAX: [phone]   CC: [name] Agee, NP [address] [place] [address] Phone: [phone]  Fax: [phone]    Return to Endocrinology clinic as below: Future Appointments  Date Time Provider Department Center  03/26/2020 11:10 AM Srah Ake, Konrad Dolores, MD [facility] None  03/26/2020  3:00 PM Candelaria Stagers, DPM [facility]  03/28/2020 11:00 AM Georgina Quint, MD [facility]  04/18/2020  2:40 PM Georgina Quint, MD [facility]CP[facility]PCP [facility]

## 2020-03-26 ENCOUNTER — Ambulatory Visit: Payer: Medicare Other | Admitting: Internal Medicine

## 2020-03-26 ENCOUNTER — Encounter: Payer: Self-pay | Admitting: Podiatry

## 2020-03-26 ENCOUNTER — Other Ambulatory Visit: Payer: Self-pay

## 2020-03-26 ENCOUNTER — Ambulatory Visit (INDEPENDENT_AMBULATORY_CARE_PROVIDER_SITE_OTHER): Payer: Medicare Other | Admitting: Podiatry

## 2020-03-26 DIAGNOSIS — D689 Coagulation defect, unspecified: Secondary | ICD-10-CM | POA: Diagnosis not present

## 2020-03-26 DIAGNOSIS — M79675 Pain in left toe(s): Secondary | ICD-10-CM | POA: Diagnosis not present

## 2020-03-26 DIAGNOSIS — M79674 Pain in right toe(s): Secondary | ICD-10-CM

## 2020-03-26 DIAGNOSIS — B351 Tinea unguium: Secondary | ICD-10-CM | POA: Diagnosis not present

## 2020-03-26 NOTE — Progress Notes (Signed)
  Subjective:  Patient ID: [name], female    DOB: Oct 12, 1941,  MRN: [id number]  Chief Complaint  Patient presents with  . Diabetes    pt is here for diabetic foot care, pt states she is also on coumadin as well.   79 y.o. female returns for the above complaint.  Patient presents with thickened elongated dystrophic toenails x10.  Patient is on Coumadin.  She does not want a carotid herself as there is a risk of bleeding associated with it.  She would like to have them debrided down.  They are painful to walk on.  She has diabetic shoes.  She denies any other acute complaints.  Objective:  There were no vitals filed for this visit. Podiatric Exam: Vascular: dorsalis pedis and posterior tibial pulses are palpable bilateral. Capillary return is immediate. Temperature gradient is WNL. Skin turgor WNL  Sensorium: Normal Semmes Weinstein monofilament test. Normal tactile sensation bilaterally. Nail Exam: Pt has thick disfigured discolored nails with subungual debris noted bilateral entire nail hallux through fifth toenails.  Pain on palpation to the nails. Ulcer Exam: There is no evidence of ulcer or pre-ulcerative changes or infection. Orthopedic Exam: Muscle tone and strength are WNL. No limitations in general ROM. No crepitus or effusions noted. HAV  B/L.  Hammer toes 2-5  B/L. Skin: No Porokeratosis. No infection or ulcers    Assessment & Plan:  No diagnosis found.  Patient was evaluated and treated and all questions answered.  Onychomycosis with pain  -Nails palliatively debrided as below. -Educated on self-care  Procedure: Nail Debridement Rationale: pain  Type of Debridement: manual, sharp debridement. Instrumentation: Nail nipper, rotary burr. Number of Nails: 10  Procedures and Treatment: Consent by patient was obtained for treatment procedures. The patient understood the discussion of treatment and procedures well. All questions were answered thoroughly reviewed.  Debridement of mycotic and hypertrophic toenails, 1 through 5 bilateral and clearing of subungual debris. No ulceration, no infection noted.  Return Visit-Office Procedure: Patient instructed to return to the office for a follow up visit 3 months for continued evaluation and treatment.  Boneta Lucks, DPM    No follow-ups on file.

## 2020-03-28 ENCOUNTER — Other Ambulatory Visit: Payer: Self-pay

## 2020-03-28 ENCOUNTER — Encounter: Payer: Self-pay | Admitting: Emergency Medicine

## 2020-03-28 ENCOUNTER — Ambulatory Visit (INDEPENDENT_AMBULATORY_CARE_PROVIDER_SITE_OTHER): Payer: Medicare Other | Admitting: Emergency Medicine

## 2020-03-28 VITALS — BP 192/58 | HR 73 | Temp 98.6°F | Ht <= 58 in | Wt 252.4 lb

## 2020-03-28 DIAGNOSIS — I10 Essential (primary) hypertension: Secondary | ICD-10-CM | POA: Diagnosis not present

## 2020-03-28 DIAGNOSIS — I700 Atherosclerosis of aorta: Secondary | ICD-10-CM | POA: Diagnosis not present

## 2020-03-28 DIAGNOSIS — E1142 Type 2 diabetes mellitus with diabetic polyneuropathy: Secondary | ICD-10-CM | POA: Diagnosis not present

## 2020-03-28 DIAGNOSIS — Z7901 Long term (current) use of anticoagulants: Secondary | ICD-10-CM

## 2020-03-28 DIAGNOSIS — I5032 Chronic diastolic (congestive) heart failure: Secondary | ICD-10-CM

## 2020-03-28 DIAGNOSIS — E1165 Type 2 diabetes mellitus with hyperglycemia: Secondary | ICD-10-CM | POA: Diagnosis not present

## 2020-03-28 DIAGNOSIS — E1159 Type 2 diabetes mellitus with other circulatory complications: Secondary | ICD-10-CM | POA: Diagnosis not present

## 2020-03-28 DIAGNOSIS — G4733 Obstructive sleep apnea (adult) (pediatric): Secondary | ICD-10-CM

## 2020-03-28 DIAGNOSIS — I152 Hypertension secondary to endocrine disorders: Secondary | ICD-10-CM

## 2020-03-28 DIAGNOSIS — Z9989 Dependence on other enabling machines and devices: Secondary | ICD-10-CM

## 2020-03-28 DIAGNOSIS — I480 Paroxysmal atrial fibrillation: Secondary | ICD-10-CM

## 2020-03-28 LAB — POCT GLYCOSYLATED HEMOGLOBIN (HGB A1C): Hemoglobin A1C: 7.9 % — AB (ref 4.0–5.6)

## 2020-03-28 LAB — GLUCOSE, POCT (MANUAL RESULT ENTRY): POC Glucose: 220 mg/dl — AB (ref 70–99)

## 2020-03-28 NOTE — Patient Instructions (Addendum)
   If you have lab work done today you will be contacted with your lab results within the next 2 weeks.  If you have not heard from us then please contact us. The fastest way to get your results is to register for My Chart.   IF you received an x-ray today, you will receive an invoice from [facility]. Please contact [facility] at [phone] with questions or concerns regarding your invoice.   IF you received labwork today, you will receive an invoice from [facility]. Please contact [facility] at [phone] with questions or concerns regarding your invoice.   Our billing staff will not be able to assist you with questions regarding bills from these companies.  You will be contacted with the lab results as soon as they are available. The fastest way to get your results is to activate your My Chart account. Instructions are located on the last page of this paperwork. If you have not heard from us regarding the results in 2 weeks, please contact this office.     Diabetes mellitus y nutricin, en adultos Diabetes Mellitus and Nutrition, Adult Si sufre de diabetes (diabetes mellitus), es muy importante tener hbitos alimenticios saludables debido a que sus niveles de azcar en la sangre (glucosa) se ven afectados en gran medida por lo que come y bebe. Comer alimentos saludables en las cantidades adecuadas, aproximadamente a la misma hora todos los das, lo ayudar a:  Controlar la glucemia.  Disminuir el riesgo de sufrir una enfermedad cardaca.  Mejorar la presin arterial.  Alcanzar o mantener un peso saludable. Todas las personas que sufren de diabetes son diferentes y cada una tiene necesidades diferentes en cuanto a un plan de alimentacin. El mdico puede recomendarle que trabaje con un especialista en dietas y nutricin (nutricionista) para elaborar el mejor plan para usted. Su plan de alimentacin puede variar segn factores como:  Las caloras que  necesita.  Los medicamentos que toma.  Su peso.  Sus niveles de glucemia, presin arterial y colesterol.  Su nivel de actividad.  Otras afecciones que tenga, como enfermedades cardacas o renales. Cmo me afectan los carbohidratos? Los carbohidratos, o hidratos de carbono, afectan su nivel de glucemia ms que cualquier otro tipo de alimento. La ingesta de carbohidratos naturalmente aumenta la cantidad de glucosa en la sangre. El recuento de carbohidratos es un mtodo destinado a llevar un registro de la cantidad de carbohidratos que se consumen. El recuento de carbohidratos es importante para mantener la glucemia a un nivel saludable, especialmente si utiliza insulina o toma determinados medicamentos por va oral para la diabetes. Es importante conocer la cantidad de carbohidratos que se pueden ingerir en cada comida sin correr ningn riesgo. Esto es diferente en cada persona. Su nutricionista puede ayudarlo a calcular la cantidad de carbohidratos que debe ingerir en cada comida y en cada refrigerio. Entre los alimentos que contienen carbohidratos, se incluyen:  Pan, cereal, arroz, pastas y galletas.  Papas y maz.  Guisantes, frijoles y lentejas.  Leche y yogur.  Frutas y jugo.  Postres, como pasteles, galletas, helado y caramelos. Cmo me afecta el alcohol? El alcohol puede provocar disminuciones sbitas de la glucemia (hipoglucemia), especialmente si utiliza insulina o toma determinados medicamentos por va oral para la diabetes. La hipoglucemia es una afeccin potencialmente mortal. Los sntomas de la hipoglucemia (somnolencia, mareos y confusin) son similares a los sntomas de haber consumido demasiado alcohol. Si el mdico afirma que el alcohol es seguro para usted, siga estas pautas:    Limite el consumo de alcohol a no ms de 1medida por da si es mujer y no est embarazada, y a 2medidas si es hombre. Una medida equivale a 12oz (355ml) de cerveza, 5oz (148ml) de vino o  1oz (44ml) de bebidas alcohlicas de alta graduacin.  No beba con el estmago vaco.  Mantngase hidratado bebiendo agua, refrescos dietticos o t helado sin azcar.  Tenga en cuenta que los refrescos comunes, los jugos y otras bebida para mezclar pueden contener mucha azcar y se deben contar como carbohidratos. Cules son algunos consejos para seguir este plan?  Leer las etiquetas de los alimentos  Comience por leer el tamao de la porcin en la "Informacin nutricional" en las etiquetas de los alimentos envasados y las bebidas. La cantidad de caloras, carbohidratos, grasas y otros nutrientes mencionados en la etiqueta se basan en una porcin del alimento. Muchos alimentos contienen ms de una porcin por envase.  Verifique la cantidad total de gramos (g) de carbohidratos totales en una porcin. Puede calcular la cantidad de porciones de carbohidratos al dividir el total de carbohidratos por 15. Por ejemplo, si un alimento tiene un total de 30g de carbohidratos, equivale a 2 porciones de carbohidratos.  Verifique la cantidad de gramos (g) de grasas saturadas y grasas trans en una porcin. Escoja alimentos que no contengan grasa o que tengan un bajo contenido.  Verifique la cantidad de miligramos (mg) de sal (sodio) en una porcin. La mayora de las personas deben limitar la ingesta de sodio total a menos de 2300mg por da.  Siempre consulte la informacin nutricional de los alimentos etiquetados como "con bajo contenido de grasa" o "sin grasa". Estos alimentos pueden tener un mayor contenido de azcar agregada o carbohidratos refinados, y deben evitarse.  Hable con su nutricionista para identificar sus objetivos diarios en cuanto a los nutrientes mencionados en la etiqueta. Al ir de compras  Evite comprar alimentos procesados, enlatados o precocinados. Estos alimentos tienden a tener una mayor cantidad de grasa, sodio y azcar agregada.  Compre en la zona exterior de la tienda de  comestibles. Esta zona incluye frutas y verduras frescas, granos a granel, carnes frescas y productos lcteos frescos. Al cocinar  Utilice mtodos de coccin a baja temperatura, como hornear, en lugar de mtodos de coccin a alta temperatura, como frer en abundante aceite.  Cocine con aceites saludables, como el aceite de oliva, canola o girasol.  Evite cocinar con manteca, crema o carnes con alto contenido de grasa. Planificacin de las comidas  Coma las comidas y los refrigerios regularmente, preferentemente a la misma hora todos los das. Evite pasar largos perodos de tiempo sin comer.  Consuma alimentos ricos en fibra, como frutas frescas, verduras, frijoles y cereales integrales. Consulte a su nutricionista sobre cuntas porciones de carbohidratos puede consumir en cada comida.  Consuma entre 4 y 6 onzas (oz) de protenas magras por da, como carnes magras, pollo, pescado, huevos o tofu. Una onza de protena magra equivale a: ? 1 onza de carne, pollo o pescado. ? 1huevo. ?  taza de tofu.  Coma algunos alimentos por da que contengan grasas saludables, como aguacates, frutos secos, semillas y pescado. Estilo de vida  Controle su nivel de glucemia con regularidad.  Haga actividad fsica habitualmente como se lo haya indicado el mdico. Esto puede incluir lo siguiente: ? 150minutos semanales de ejercicio de intensidad moderada o alta. Esto podra incluir caminatas dinmicas, ciclismo o gimnasia acutica. ? Realizar ejercicios de elongacin y de fortalecimiento, como yoga o levantamiento   de pesas, por lo menos 2veces por semana.  Tome los medicamentos como se lo haya indicado el mdico.  No consuma ningn producto que contenga nicotina o tabaco, como cigarrillos y cigarrillos electrnicos. Si necesita ayuda para dejar de fumar, consulte al mdico.  Trabaje con un asesor o instructor en diabetes para identificar estrategias para controlar el estrs y cualquier desafo emocional  y social. Preguntas para hacerle al mdico  Es necesario que consulte a un instructor en el cuidado de la diabetes?  Es necesario que me rena con un nutricionista?  A qu nmero puedo llamar si tengo preguntas?  Cules son los mejores momentos para controlar la glucemia? Dnde encontrar ms informacin:  Asociacin Estadounidense de la Diabetes (American Diabetes Association): diabetes.org  Academia de Nutricin y Diettica (Academy of Nutrition and Dietetics): www.eatright.org  Instituto Nacional de la Diabetes y las Enfermedades Digestivas y Renales (National Institute of Diabetes and Digestive and Kidney Diseases, NIH): www.niddk.nih.gov Resumen  Un plan de alimentacin saludable lo ayudar a controlar la glucemia y mantener un estilo de vida saludable.  Trabajar con un especialista en dietas y nutricin (nutricionista) puede ayudarlo a elaborar el mejor plan de alimentacin para usted.  Tenga en cuenta que los carbohidratos (hidratos de carbono) y el alcohol tienen efectos inmediatos en sus niveles de glucemia. Es importante contar los carbohidratos que ingiere y consumir alcohol con prudencia. Esta informacin no tiene como fin reemplazar el consejo del mdico. Asegrese de hacerle al mdico cualquier pregunta que tenga. Document Revised: 06/16/2017 Document Reviewed: 01/26/2017 Elsevier Patient Education  2020 [employer].  

## 2020-03-28 NOTE — Progress Notes (Signed)
[name] 79 y.o.   Chief Complaint  Patient presents with  . Hospitalization Follow-up  . Diabetes   Attestation signed by Courtney Paris, MD at 03/22/2020 8:25 AM  Attestation: Medical screening examination/treatment/procedure(s) were conducted as a shared visit with non-physician practitioner(s) and myself.  I personally evaluated the patient during the encounter.  EKG: EKG Interpretation  Date/Time:                  Tuesday March 20 2020 16:53:03 EDT Ventricular Rate:         64 PR Interval:                   QRS Duration: 93 QT Interval:                 480 QTC Calculation:        496 R Axis:                         47 Text Interpretation:      Sinus or ectopic atrial rhythm Prolonged PR interval Low voltage, precordial leads Borderline T abnormalities, anterior leads Borderline prolonged QT interval No significant change since last tracing Confirmed by Calvert Cantor [phone]) on 03/20/2020 5:02:59 PM   [name] is a 79 y.o. female with a past medical history significant for diabetes, A. fib on Coumadin, CKD, CHF, hypertension, hyperlipidemia, who presents for diarrhea and abdominal discomfort.  Patient reports feeling somewhat lightheaded this morning and was called in for the diarrhea.  On exam, lungs clear and chest is nontender.  Abdomen is slightly tender.  Bowel sounds are appreciated.  Patient otherwise resting comfortably. Image  Was obtained and was overall reassuring with no acute process is seen.  Patient will follow up with PCP and will return if symptoms change or worsen.  Patient agreed with plan of care and was discharged in good condition.    HISTORY OF PRESENT ILLNESS: This is a 79 y.o. female complaining of erratic blood sugar numbers over the last couple days. Was seen in the emergency room on 03/20/2020 for abdominal pain and diarrhea.  Unremarkable work-up.  Treated and sent home.  Doing better with symptoms 100% better. Has multiple  chronic medical problems as follows: 1.  Hypertension: On clonidine, Cardizem CD and lisinopril.  Did not take blood pressure medication this morning.  Was rushing to come to the office. 2.  Paroxysmal atrial fibrillation: On long-term anticoagulation with Coumadin. Goes to Coumadin clinic.  Also on amiodarone. 3.  Congestive heart failure: Sees cardiologist on a regular basis.  Takes torsemide. 4.  Diabetes insulin-dependent.  Sees endocrinologist on a regular basis.  Also takes Ozempic weekly.  On statin therapy with rosuvastatin 10 mg daily.  Intolerant to Metformin. 5.  Obstructive sleep apnea on CPAP treatment 6.  Chronic kidney disease: Stable 7.  History of gout: On allopurinol 300 mg daily. Recently seen here by my colleague Maximiano Coss and was started on gabapentin for diabetic polyneuropathy but sedation became a problem and she stopped.  Was then prescribed Lyrica 25 mg twice a day but has not started yet. No other complaints or medical concerns today. HPI   Prior to Admission medications   Medication Sig Start Date End Date Taking? Authorizing Provider  Accu-Chek Softclix Lancets lancets 3 (three) times daily. as directed 09/27/19  Yes [provider]  allopurinol (ZYLOPRIM) 300 MG tablet Take 300 mg by mouth daily.  07/07/13  Yes  [provider]  amiodarone (PACERONE) 200 MG tablet TAKE 1 TABLET BY MOUTH EVERY DAY. [place] ONLY 03/16/20  Yes [name], MD  cloNIDine (CATAPRES) 0.2 MG tablet Take 0.2 mg by mouth 2 (two) times daily.   Yes [provider]  diltiazem (CARDIZEM CD) 120 MG 24 hr capsule Take one capsule daily; may take twice a day during afib episode 10/17/19  Yes [name], MD  gabapentin (NEURONTIN) 300 MG capsule Take 1 capsule (300 mg total) by mouth 3 (three) times daily. 02/28/20  Yes Maximiano Coss, NP  Insulin Lispro Prot & Lispro (HUMALOG 75/25 MIX) (75-25) 100 UNIT/ML Kwikpen Inject 0-56 Units into the skin 3  (three) times daily as needed (high blood sugar). Sliding Scale 08/12/16  Yes [provider]  isosorbide mononitrate (IMDUR) 30 MG 24 hr tablet Take 0.5 tablets (15 mg total) by mouth daily. 02/21/20  Yes Shirley Friar, PA-C  levothyroxine (SYNTHROID, LEVOTHROID) 75 MCG tablet Take 75 mcg daily before breakfast by mouth.  12/29/16  Yes [provider]  lisinopril (ZESTRIL) 40 MG tablet Take 60 mg by mouth daily.  12/02/19  Yes [provider]  loperamide (IMODIUM) 2 MG capsule Take 1 capsule (2 mg total) by mouth 4 (four) times daily as needed for diarrhea or loose stools. 03/20/20  Yes Belaya, Maria A, PA-C  NOVOFINE PLUS 32G X 4 MM MISC 2 (two) times daily. as directed 10/15/19  Yes [provider]  potassium chloride (KLOR-CON) 10 MEQ tablet Take 2 tablets (20 mEq total) by mouth daily. 10/18/19  Yes [name], MD  pregabalin (LYRICA) 25 MG capsule Take 1 capsule (25 mg total) by mouth 2 (two) times daily. 03/07/20  Yes Maximiano Coss, NP  RESTASIS MULTIDOSE 0.05 % ophthalmic emulsion Place 1 drop into both eyes 2 (two) times daily.  03/04/20  Yes [provider]  rosuvastatin (CRESTOR) 10 MG tablet Take 10 mg by mouth every evening.   Yes [provider]  Semaglutide,0.25 or 0.5MG /DOS, (OZEMPIC, 0.25 OR 0.5 MG/DOSE,) 2 MG/1.5ML SOPN Inject 0.4 mLs into the skin once a week. 05/17/19  Yes [provider]  torsemide (DEMADEX) 20 MG tablet Take 2 tablets (40 mg total) by mouth daily. 09/29/19  Yes [name], MD  warfarin (COUMADIN) 5 MG tablet TAKE 1/2 TABLET BY MOUTH ON MONDAY, WEDNESDAY AND FRIDAY AND 1 TABLET BY MOUTH ON ALL OTHER DAYS AS DIRECTED BY COUMADIN CLINIC Patient taking differently: Take 2.5-5 mg by mouth as directed. TAKE 1/2 TABLET (2.5 MG) BY MOUTH ON MONDAY, WEDNESDAY AND FRIDAY AND 1 TABLET BY MOUTH ON ALL OTHER DAYS AS DIRECTED BY COUMADIN CLINIC 02/06/20  Yes [name], MD    Allergies  Allergen  Reactions  . Metformin Diarrhea    Patient Active Problem List   Diagnosis Date Noted  . Obesity 12/19/2015  . Hypersomnia 12/19/2015  . Acute diastolic congestive heart failure, NYHA class 3 ([place]) 10/03/2013  . Prolonged QT interval 10/03/2013  . Acute diastolic heart failure ([place]) 10/03/2013  . Long term (current) use of anticoagulants 06/09/2012  . BENIGN NEOPLASM OF ADRENAL GLAND 11/25/2010  . Chronic diastolic heart failure ([place]) 02/20/2010  . DM 05/16/2009  . GOUT 05/16/2009  . OBESITY, MORBID 05/16/2009  . Essential hypertension 05/16/2009  . Paroxysmal atrial fibrillation ([place]) 05/16/2009  . HYPERLIPIDEMIA 11/30/2008  . Obstructive sleep apnea on CPAP 11/30/2008    Past Medical History:  Diagnosis Date  . Arthritis   .  Back pain   . Chronic anticoagulation    due to aflutter  . Chronic kidney disease   . Diabetes mellitus   . Diastolic CHF, chronic ([place])    a.  echo 2006 - ef 55-65%; mild diast dysfxn;    b. Echo 08/2011: Mild LVH, EF 60%;  c. 04/2013 Echo: EF 65-69%, mild conc LVH;  08/2014 Echo: EF 60-65%, mild-mod MR.  . Gout   . Hyperlipidemia   . Hypertension    a.  Renal arterial Dopplers 12/2011: 1-59% right renal artery stenosis  . Morbid obesity ([place])   . Obstructive sleep apnea on CPAP   . Paroxysmal Afib/Flutter    a. 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 APPENDECTOMY    . ATRIAL FLUTTER ABLATION N/A 09/24/2011   Procedure: ATRIAL FLUTTER ABLATION;  Surgeon: [name], MD;  Location[facility] CATH LAB;  Service: Cardiovascular;  Laterality: N/A;  . CARDIOVERSION  10/22/2011   Procedure: CARDIOVERSION;  Surgeon: [name], MD;  Location: [facility];  Service: Cardiovascular;  Laterality: N/A;  . CARDIOVERSION N/A 09/10/2011   Procedure: CARDIOVERSION;  Surgeon: [name], MD;  Location[facility] CATH LAB;  Service: Cardiovascular;  Laterality: N/A;  . CHOLECYSTECTOMY    . TONSILLECTOMY  1982  .  TOTAL ABDOMINAL HYSTERECTOMY      Social History   Socioeconomic History  . Marital status: Married    Spouse name: Not on file  . Number of children: 3  . Years of education: Not on file  . Highest education level: Not on file  Occupational History  . Occupation: DISABILITY/housewife    Employer: RETIRED  Tobacco Use  . Smoking status: Never Smoker  . Smokeless tobacco: Never Used  Substance and Sexual Activity  . Alcohol use: No  . Drug use: No  . Sexual activity: Yes  Other Topics Concern  . Not on file  Social History Narrative  . Not on file   Social Determinants of Health   Financial Resource Strain:   . Difficulty of Paying Living Expenses:   Food Insecurity:   . Worried About Charity fundraiser in the Last Year:   . Arboriculturist in the Last Year:   Transportation Needs:   . Film/video editor (Medical):   Marland Kitchen Lack of Transportation (Non-Medical):   Physical Activity:   . Days of Exercise per Week:   . Minutes of Exercise per Session:   Stress:   . Feeling of Stress :   Social Connections:   . Frequency of Communication with Friends and Family:   . Frequency of Social Gatherings with Friends and Family:   . Attends Religious Services:   . Active Member of Clubs or Organizations:   . Attends Archivist Meetings:   Marland Kitchen Marital Status:   Intimate Partner Violence:   . Fear of Current or Ex-Partner:   . Emotionally Abused:   Marland Kitchen Physically Abused:   . Sexually Abused:     Family History  Problem Relation Age of Onset  . Heart disease Father   . Hypertension Father   . Breast cancer Sister   . Cancer Sister        breast     Review of Systems  Constitutional: Negative.  Negative for chills and fever.  HENT: Negative.  Negative for congestion and sore throat.   Respiratory: Negative.  Negative for cough and shortness of breath.  Cardiovascular: Negative.  Negative for chest pain and palpitations.  Gastrointestinal: Negative.   Negative for abdominal pain, blood in stool, constipation, diarrhea, melena, nausea and vomiting.  Genitourinary: Negative.  Negative for dysuria and hematuria.  Skin: Negative.  Negative for rash.  Neurological: Negative for dizziness and headaches.  All other systems reviewed and are negative.   Today's Vitals   03/28/20 1048 03/28/20 1101  BP: (!) 199/73 (!) 192/58  Pulse: 73   Temp: 98.6 F (37 C)   TempSrc: Temporal   SpO2: 96%   Weight: 252 lb 6.4 oz (114.5 kg)   Height: 4\' 10"  (1.473 m)    Body mass index is 52.75 kg/m.  Physical Exam Vitals reviewed.  Constitutional:      Appearance: She is obese.  HENT:     Head: Normocephalic.  Eyes:     Extraocular Movements: Extraocular movements intact.     Pupils: Pupils are equal, round, and reactive to light.  Cardiovascular:     Rate and Rhythm: Normal rate and regular rhythm.     Pulses: Normal pulses.     Heart sounds: Normal heart sounds.  Pulmonary:     Effort: Pulmonary effort is normal.     Breath sounds: Normal breath sounds.  Musculoskeletal:        General: Normal range of motion.     Cervical back: Normal range of motion. No tenderness.  Skin:    General: Skin is warm and dry.     Capillary Refill: Capillary refill takes less than 2 seconds.  Neurological:     General: No focal deficit present.     Mental Status: She is alert and oriented to person, place, and time.  Psychiatric:        Mood and Affect: Mood normal.        Behavior: Behavior normal.    Results for orders placed or performed in visit on 03/28/20 (from the past 24 hour(s))  POCT glucose (manual entry)     Status: Abnormal   Collection Time: 03/28/20 11:33 AM  Result Value Ref Range   POC Glucose 220 (A) 70 - 99 mg/dl  POCT glycosylated hemoglobin (Hb A1C)     Status: Abnormal   Collection Time: 03/28/20 11:39 AM  Result Value Ref Range   Hemoglobin A1C 7.9 (A) 4.0 - 5.6 %   HbA1c POC (<> result, manual entry)     HbA1c, POC  (prediabetic range)     HbA1c, POC (controlled diabetic range)     A total of 30 minutes was spent with the patient, greater than 50% of which was in counseling/coordination of care regarding diabetes and management, review of recent emergency department visit notes, review of most recent blood work results including today's glucose and hemoglobin A1c, review of all medications including insulin and how to use it, diet and nutrition, need for follow-up with endocrinologist, prognosis and need for follow-up..   ASSESSMENT & PLAN: Qiana was seen today for hospitalization follow-up and diabetes.  Diagnoses and all orders for this visit:  Uncontrolled type 2 diabetes mellitus with hyperglycemia ([place]) -     Ambulatory referral to Endocrinology  Aortic atherosclerosis ([place])  Diabetic polyneuropathy associated with type 2 diabetes mellitus ([place])  Hypertension associated with diabetes ([place]) -     POCT glucose (manual entry) -     POCT glycosylated hemoglobin (Hb A1C)  Morbid obesity ([place])  Long term (current) use of anticoagulants  Chronic diastolic heart failure ([facility])  Paroxysmal atrial fibrillation ([place])  Obstructive sleep apnea on CPAP    Patient Instructions       If you have lab work done today you will be contacted with your lab results within the next 2 weeks.  If you have not heard from [place] then please contact us. The fastest way to get your results is to register for My Chart.   IF you received an x-ray today, you will receive an invoice from [facility]. Please contact [facility] at [phone] with questions or concerns regarding your invoice.   IF you received labwork today, you will receive an invoice from [facility]. Please contact [facility] at [phone] with questions or concerns regarding your invoice.   Our billing staff will not be able to assist you with questions regarding bills from these companies.  You will be contacted with the lab  results as soon as they are available. The fastest way to get your results is to activate your My Chart account. Instructions are located on the last page of this paperwork. If you have not heard from [place] regarding the results in 2 weeks, please contact this office.      Diabetes mellitus y nutricin, en adultos Diabetes Mellitus and Nutrition, Adult Si sufre de diabetes (diabetes mellitus), es muy importante tener hbitos alimenticios saludables debido a que sus niveles de Designer, television/film set sangre (glucosa) se ven afectados en gran medida por lo que come y bebe. [place] alimentos saludables en las cantidades [place], aproximadamente a la [employer], [place] ayudar a:  Aeronautical engineer glucemia.  Disminuir el riesgo de sufrir una enfermedad cardaca.  Mejorar la presin arterial.  Science writer o mantener un peso saludable. Todas las personas que sufren de diabetes son diferentes y cada una tiene necesidades diferentes en cuanto a un plan de alimentacin. El mdico puede recomendarle que trabaje con un especialista en dietas y nutricin (nutricionista) para Financial trader plan para usted. Su plan de alimentacin puede variar segn factores como:  Las caloras que necesita.  Los medicamentos que toma.  Su peso.  Sus niveles de glucemia, presin arterial y colesterol.  Su nivel de [place].  Otras afecciones que tenga, como enfermedades cardacas o renales. Cmo me afectan los carbohidratos? Los carbohidratos, o hidratos de carbono, afectan su nivel de glucemia ms que cualquier otro tipo de alimento. La ingesta de carbohidratos naturalmente aumenta la cantidad de [employer]. El recuento de carbohidratos es un mtodo destinado a Catering manager un registro de la cantidad de carbohidratos que se consumen. El recuento de carbohidratos es importante para Theatre manager la glucemia a un nivel saludable, especialmente si utiliza insulina o toma determinados medicamentos por va oral para la  diabetes. Es importante conocer la cantidad de carbohidratos que se pueden ingerir en cada comida sin correr Engineer, manufacturing. Esto es Psychologist, forensic. Su nutricionista puede ayudarlo a calcular la cantidad de carbohidratos que debe ingerir en cada comida y en cada refrigerio. Entre los alimentos que contienen carbohidratos, se incluyen:  Pan, cereal, arroz, pastas y galletas.  Papas y maz.  Guisantes, frijoles y lentejas.  Leche y Estate agent.  Lambert Mody y [place].  Postres, como pasteles, galletas, helado y caramelos. Cmo me afecta el alcohol? El alcohol puede provocar disminuciones sbitas de la glucemia (hipoglucemia), especialmente si utiliza insulina o toma determinados medicamentos por va oral para la diabetes. La hipoglucemia es una afeccin potencialmente mortal. Los sntomas de la hipoglucemia (somnolencia, mareos y confusin) son similares a los sntomas de haber consumido demasiado alcohol.  Si el mdico afirma que el alcohol es seguro para usted, [place] estas pautas:  Limite el consumo de alcohol a no ms de 4medida por da si es mujer y no est [place], y a 72medidas si es hombre. Una medida equivale a 12oz (331ml) de cerveza, 5oz (130ml) de vino o 1oz (78ml) de bebidas alcohlicas de alta graduacin.  No beba con el estmago vaco.  Mantngase hidratado bebiendo agua, refrescos dietticos o t helado sin azcar.  Tenga en cuenta que los refrescos comunes, los jugos y otras bebida para Optician, dispensing pueden contener mucha azcar y se deben contar como carbohidratos. Cules son algunos consejos para seguir este plan?  Leer las etiquetas de los alimentos  Comience por leer el tamao de la porcin en la "Informacin nutricional" en las etiquetas de los alimentos envasados y las bebidas. La cantidad de caloras, carbohidratos, grasas y otros nutrientes mencionados en la etiqueta se basan en una porcin del alimento. Muchos alimentos contienen ms de una porcin por  envase.  Verifique la cantidad total de gramos (g) de carbohidratos totales en una porcin. Puede calcular la cantidad de porciones de carbohidratos al dividir el total de carbohidratos por 15. Por ejemplo, si un alimento tiene un total de 30g de carbohidratos, equivale a 2 porciones de carbohidratos.  Verifique la cantidad de gramos (g) de grasas saturadas y grasas trans en una porcin. Escoja alimentos que no contengan grasa o que tengan un bajo contenido.  Verifique la cantidad de miligramos (mg) de sal (sodio) en una porcin. La [employer] de las personas deben limitar la ingesta de sodio total a menos de 2300mg  por Training and development officer.  Siempre consulte la informacin nutricional de los alimentos etiquetados como "con bajo contenido de grasa" o "sin grasa". Estos alimentos pueden tener un mayor contenido de Location manager agregada o carbohidratos refinados, y deben evitarse.  Hable con su nutricionista para identificar sus objetivos diarios en cuanto a los nutrientes mencionados en la etiqueta. Al ir de compras  Evite comprar alimentos procesados, enlatados o precocinados. Estos alimentos tienden a Special educational needs teacher mayor cantidad de [place], sodio y azcar agregada.  Compre en la zona exterior de la tienda de comestibles. Esta zona incluye frutas y verduras frescas, granos a granel, carnes frescas y productos lcteos frescos. Al cocinar  Utilice mtodos de coccin a baja temperatura, como hornear, en lugar de mtodos de coccin a alta temperatura, como frer en abundante aceite.  Cocine con aceites saludables, como el aceite de [place], canola o [place].  Evite cocinar con manteca, crema o carnes con alto contenido de grasa. Planificacin de las comidas  Coma las comidas y los refrigerios regularmente, preferentemente a la misma hora todos [place]. Evite pasar largos perodos de tiempo sin comer.  Consuma alimentos ricos en fibra, como frutas frescas, verduras, frijoles y cereales integrales. Consulte a su  nutricionista sobre cuntas porciones de carbohidratos puede consumir en cada comida.  Consuma entre 4 y 6 onzas (oz) de protenas magras por da, como carnes [place], pollo, pescado, huevos o tofu. Una onza de protena magra equivale a: ? 1 onza de carne, pollo o pescado. ? 1huevo. ?  taza de tofu.  Coma algunos alimentos por da que contengan grasas saludables, como aguacates, frutos secos, semillas y pescado. Estilo de vida  Controle su nivel de glucemia con regularidad.  Haga actividad fsica habitualmente como se lo haya indicado el mdico. Esto puede incluir lo siguiente: ? 110minutos semanales de ejercicio de intensidad moderada o alta. Esto podra incluir caminatas dinmicas, ciclismo  o gimnasia acutica. ? Realizar ejercicios de elongacin y de fortalecimiento, como yoga o levantamiento de pesas, por lo menos 2veces por semana.  Tome los [employer] se lo haya indicado el mdico.  No consuma ningn producto que contenga nicotina o tabaco, como cigarrillos y Psychologist, sport and exercise. Si necesita ayuda para dejar de fumar, consulte al [employer] con un asesor o instructor en diabetes para identificar estrategias para controlar el estrs y cualquier desafo emocional y social. Preguntas para hacerle al mdico  Es necesario que consulte a Radio broadcast assistant en el cuidado de la diabetes?  Es necesario que me rena con un nutricionista?  A qu nmero puedo llamar si tengo preguntas?  Cules son los mejores momentos para controlar la glucemia? Dnde encontrar ms informacin:  Asociacin Estadounidense de la Diabetes (American Diabetes Association): diabetes.org  Academia de Nutricin y Information systems manager (Academy of Nutrition and Dietetics): www.eatright.[place] Diabetes y las Enfermedades Digestivas y [place] [facility] of Diabetes and Digestive and Kidney Diseases, NIH): DesMoinesFuneral.dk Resumen  Un plan de alimentacin saludable lo  ayudar a Aeronautical engineer glucemia y Theatre manager un estilo de vida saludable.  Trabajar con un especialista en dietas y nutricin (nutricionista) puede ayudarlo a Insurance claims handler de alimentacin para usted.  Tenga en cuenta que los carbohidratos (hidratos de carbono) y el alcohol tienen efectos inmediatos en sus niveles de glucemia. Es importante contar los carbohidratos que ingiere y consumir alcohol con prudencia. Esta informacin no tiene Marine scientist el consejo del mdico. Asegrese de hacerle al mdico cualquier pregunta que tenga. Document Revised: 06/16/2017 Document Reviewed: 01/26/2017 Elsevier Patient Education  2020 Elsevier Inc.      Agustina Caroli, MD [facility]

## 2020-03-29 ENCOUNTER — Telehealth: Payer: Self-pay | Admitting: Registered Nurse

## 2020-03-29 NOTE — Telephone Encounter (Signed)
Copied from [place] [id number]. Topic: General - Other >> Mar 29, 2020 11:45 AM Rainey Pines A wrote: Patient stated that she spoke with a nurse earlier today but could not recall the name. Patient stated that the pharmacy is requesting the nurse call and explain what type of medication needs to filled for patient. Patient described that she needs the "FREESTYLE #2" filled because she dropped a black component of the device. Patient was unsure how to describe the name of what broke on device. Please advise   [facility] [place], [place] Phone:  [phone]  Fax:  [phone]

## 2020-03-29 NOTE — Telephone Encounter (Signed)
Per initial encounter,  Copied from [place] [id number]. Topic: General - Other >> Mar 29, 2020 11:45 AM Rainey Pines A wrote: Patient stated that she spoke with a nurse earlier today but could not recall the name. Patient stated that the pharmacy is requesting the nurse call and explain what type of medication needs to filled for patient. Patient described that she needs the "FREESTYLE #2" filled because she dropped a black component of the device. Patient was unsure how to describe the name of what broke on device. Please advise   [facility] [place], [place] Phone:  [phone]  Fax:  [phone]     The pt was seen in office 03/28/20; will route to office for final disposition.

## 2020-03-30 ENCOUNTER — Other Ambulatory Visit: Payer: Self-pay | Admitting: Registered Nurse

## 2020-03-30 DIAGNOSIS — E1142 Type 2 diabetes mellitus with diabetic polyneuropathy: Secondary | ICD-10-CM

## 2020-03-30 MED ORDER — NOVOFINE PLUS 32G X 4 MM MISC: 1.0000 | Freq: Two times a day (BID) | 3 refills | Status: DC

## 2020-03-30 MED ORDER — ACCU-CHEK SOFTCLIX LANCETS MISC: 1.0000 | Freq: Three times a day (TID) | 3 refills | Status: AC

## 2020-03-30 MED ORDER — FREESTYLE LIBRE 14 DAY READER DEVI: 0 refills | Status: DC

## 2020-03-30 NOTE — Telephone Encounter (Addendum)
I have called pt back to gather some more information. Pt needs to have the Freestyle 2 reader device since she has broken hers.   I called [facility] and spoke to the phramacisit about getting it for pt, however it will not be covered there. It will only be covered via her mail order [facility], which is where got the device from originally. The pharmacist has also informed me that if the pt has a smart device then downloading the app will be the same thing as the reader.   I have called the pt back and relayed the information below and she stated that she is technologically handicap and doesn't know anything about electronics. She still wanted the meter so I have send an order [facility] so she will have it once she gets back from her trip ([place]).   She says she will try to get her 75 year old grandson to help her navigate the device.

## 2020-03-30 NOTE — Addendum Note (Signed)
Addended by: Kittie Plater, Aloysuis Ribaudo HUA on: 03/30/2020 02:27 PM   Modules accepted: Orders

## 2020-04-13 DIAGNOSIS — Z794 Long term (current) use of insulin: Secondary | ICD-10-CM | POA: Diagnosis not present

## 2020-04-13 DIAGNOSIS — E1142 Type 2 diabetes mellitus with diabetic polyneuropathy: Secondary | ICD-10-CM | POA: Diagnosis not present

## 2020-04-16 ENCOUNTER — Other Ambulatory Visit: Payer: Self-pay

## 2020-04-16 ENCOUNTER — Ambulatory Visit (INDEPENDENT_AMBULATORY_CARE_PROVIDER_SITE_OTHER): Payer: Medicare Other

## 2020-04-16 DIAGNOSIS — Z5181 Encounter for therapeutic drug level monitoring: Secondary | ICD-10-CM | POA: Diagnosis not present

## 2020-04-16 DIAGNOSIS — I480 Paroxysmal atrial fibrillation: Secondary | ICD-10-CM | POA: Diagnosis not present

## 2020-04-16 LAB — POCT INR: INR: 2 (ref 2.0–3.0)

## 2020-04-16 NOTE — Patient Instructions (Signed)
Description   Take 1 tablet today, then resume same dosage 1 tablet everyday except 1/2 tablet on Mondays, Wednesdays and Fridays.  Recheck INR in 6 weeks. Call with any new medications of if scheduled for any procedures [phone]

## 2020-04-18 ENCOUNTER — Encounter: Payer: Medicare Other | Admitting: Emergency Medicine

## 2020-04-18 ENCOUNTER — Other Ambulatory Visit: Payer: Self-pay | Admitting: *Deleted

## 2020-04-18 ENCOUNTER — Telehealth: Payer: Self-pay | Admitting: *Deleted

## 2020-04-18 ENCOUNTER — Other Ambulatory Visit: Payer: Self-pay

## 2020-04-18 MED ORDER — FREESTYLE LIBRE 14 DAY READER DEVI: 0 refills | Status: DC

## 2020-04-18 MED ORDER — OZEMPIC (0.25 OR 0.5 MG/DOSE) 2 MG/1.5ML ~~LOC~~ SOPN: 0.5333 mg | PEN_INJECTOR | SUBCUTANEOUS | 3 refills | Status: DC

## 2020-04-18 MED ORDER — OZEMPIC (0.25 OR 0.5 MG/DOSE) 2 MG/1.5ML ~~LOC~~ SOPN: 1.0000 mg | PEN_INJECTOR | SUBCUTANEOUS | 3 refills | Status: DC

## 2020-04-18 NOTE — Telephone Encounter (Signed)
Spoke to pharmacist Truman Hayward at [facility] [phone], because the patient's Ozempic dosage was incorrect. I spoke to Dr Mitchel Honour and it was corrected and resent.

## 2020-05-01 ENCOUNTER — Telehealth: Payer: Self-pay | Admitting: Emergency Medicine

## 2020-05-01 ENCOUNTER — Encounter (HOSPITAL_COMMUNITY): Payer: Self-pay

## 2020-05-01 ENCOUNTER — Emergency Department (HOSPITAL_COMMUNITY)
Admission: EM | Admit: 2020-05-01 | Discharge: 2020-05-01 | Disposition: A | Discharge status: Home / Self Care | Payer: Medicare Other | Attending: Emergency Medicine | Admitting: Emergency Medicine

## 2020-05-01 ENCOUNTER — Emergency Department (HOSPITAL_COMMUNITY): Payer: Medicare Other

## 2020-05-01 DIAGNOSIS — E1122 Type 2 diabetes mellitus with diabetic chronic kidney disease: Secondary | ICD-10-CM | POA: Insufficient documentation

## 2020-05-01 DIAGNOSIS — Z794 Long term (current) use of insulin: Secondary | ICD-10-CM | POA: Insufficient documentation

## 2020-05-01 DIAGNOSIS — I509 Heart failure, unspecified: Secondary | ICD-10-CM | POA: Insufficient documentation

## 2020-05-01 DIAGNOSIS — I4891 Unspecified atrial fibrillation: Secondary | ICD-10-CM | POA: Diagnosis not present

## 2020-05-01 DIAGNOSIS — N189 Chronic kidney disease, unspecified: Secondary | ICD-10-CM | POA: Diagnosis not present

## 2020-05-01 DIAGNOSIS — I130 Hypertensive heart and chronic kidney disease with heart failure and stage 1 through stage 4 chronic kidney disease, or unspecified chronic kidney disease: Secondary | ICD-10-CM | POA: Diagnosis not present

## 2020-05-01 DIAGNOSIS — R5381 Other malaise: Secondary | ICD-10-CM | POA: Diagnosis not present

## 2020-05-01 DIAGNOSIS — Z79899 Other long term (current) drug therapy: Secondary | ICD-10-CM | POA: Insufficient documentation

## 2020-05-01 DIAGNOSIS — I700 Atherosclerosis of aorta: Secondary | ICD-10-CM | POA: Diagnosis not present

## 2020-05-01 DIAGNOSIS — R0902 Hypoxemia: Secondary | ICD-10-CM | POA: Diagnosis not present

## 2020-05-01 DIAGNOSIS — I480 Paroxysmal atrial fibrillation: Secondary | ICD-10-CM

## 2020-05-01 DIAGNOSIS — Z743 Need for continuous supervision: Secondary | ICD-10-CM | POA: Diagnosis not present

## 2020-05-01 DIAGNOSIS — I499 Cardiac arrhythmia, unspecified: Secondary | ICD-10-CM | POA: Diagnosis not present

## 2020-05-01 LAB — PROTIME-INR
INR: 1.9 — ABNORMAL HIGH (ref 0.8–1.2)
Prothrombin Time: 21 seconds — ABNORMAL HIGH (ref 11.4–15.2)

## 2020-05-01 LAB — CBC
HCT: 42.4 % (ref 36.0–46.0)
Hemoglobin: 13.2 g/dL (ref 12.0–15.0)
MCH: 26.9 pg (ref 26.0–34.0)
MCHC: 31.1 g/dL (ref 30.0–36.0)
MCV: 86.4 fL (ref 80.0–100.0)
Platelets: 341 10*3/uL (ref 150–400)
RBC: 4.91 MIL/uL (ref 3.87–5.11)
RDW: 16.7 % — ABNORMAL HIGH (ref 11.5–15.5)
WBC: 12.6 10*3/uL — ABNORMAL HIGH (ref 4.0–10.5)
nRBC: 0 % (ref 0.0–0.2)

## 2020-05-01 LAB — BASIC METABOLIC PANEL
Anion gap: 11 (ref 5–15)
BUN: 21 mg/dL (ref 8–23)
CO2: 32 mmol/L (ref 22–32)
Calcium: 8.9 mg/dL (ref 8.9–10.3)
Chloride: 98 mmol/L (ref 98–111)
Creatinine, Ser: 0.74 mg/dL (ref 0.44–1.00)
GFR calc Af Amer: >60 mL/min (ref >60)
GFR calc non Af Amer: >60 mL/min (ref >60)
Glucose, Bld: 139 mg/dL — ABNORMAL HIGH (ref 70–99)
Potassium: 3.5 mmol/L (ref 3.5–5.1)
Sodium: 141 mmol/L (ref 135–145)

## 2020-05-01 LAB — GLUCOSE, CAPILLARY: Glucose-Capillary: 167 mg/dL — ABNORMAL HIGH (ref 70–99)

## 2020-05-01 NOTE — Discharge Instructions (Signed)
You did not have atrial fibrillation during her ED visit. It is possible that you broke your A. fib while waiting for the care.  Please follow-up with the A. fib clinic.

## 2020-05-01 NOTE — ED Provider Notes (Signed)
[facility] DEPT Provider Note   CSN: [id number] Arrival date & time: 05/01/20  1221     History Chief Complaint  Patient presents with  . Irregular Heart Beat    [name] is a 79 y.o. female.  HPI     79 year old female comes in a chief complaint of irregular heartbeat. She has history of paroxysmal A. Fib.  She also has history of CHF, OSA.  Patient is on Coumadin for A. fib.  She reports that around 2 AM yesterday she woke up to go to the bathroom and noted that she was having palpitations.  She felt weak and dizzy.  This morning she decided to come to the ER as her monitor was still showing that her heart rate was in the 140s.  She feels like her heart is not palpating anymore.  Patient does not have any chest pain, shortness of breath, nausea, vomiting at the moment.  She denies new medications or missing any scheduled medications.  Patient denies any recent fevers, chills, nausea, vomiting.  Past Medical History:  Diagnosis Date  . Arthritis   . Back pain   . Chronic anticoagulation    due to aflutter  . Chronic kidney disease   . Diabetes mellitus   . Diastolic CHF, chronic ([place])    a.  echo 2006 - ef 55-65%; mild diast dysfxn;    b. Echo 08/2011: Mild LVH, EF 60%;  c. 04/2013 Echo: EF 65-69%, mild conc LVH;  08/2014 Echo: EF 60-65%, mild-mod MR.  . Gout   . Hyperlipidemia   . Hypertension    a.  Renal arterial Dopplers 12/2011: 1-59% right renal artery stenosis  . Morbid obesity ([place])   . Obstructive sleep apnea on CPAP   . Paroxysmal Afib/Flutter    a. 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    Patient Active Problem List   Diagnosis Date Noted  . Aortic atherosclerosis ([place]) 03/28/2020  . Obesity 12/19/2015  . Hypersomnia 12/19/2015  . Acute diastolic congestive heart failure, NYHA class 3 ([place]) 10/03/2013  . Prolonged QT interval 10/03/2013  . Acute diastolic heart failure ([place]) 10/03/2013  . Long term (current) use  of anticoagulants 06/09/2012  . BENIGN NEOPLASM OF ADRENAL GLAND 11/25/2010  . Chronic diastolic heart failure ([place]) 02/20/2010  . DM 05/16/2009  . GOUT 05/16/2009  . OBESITY, MORBID 05/16/2009  . Essential hypertension 05/16/2009  . Paroxysmal atrial fibrillation ([place]) 05/16/2009  . HYPERLIPIDEMIA 11/30/2008  . Obstructive sleep apnea on CPAP 11/30/2008    Past Surgical History:  Procedure Laterality Date  . APPENDECTOMY    . ATRIAL FLUTTER ABLATION N/A 09/24/2011   Procedure: ATRIAL FLUTTER ABLATION;  Surgeon: [name], MD;  Location: [facility] CATH LAB;  Service: Cardiovascular;  Laterality: N/A;  . CARDIOVERSION  10/22/2011   Procedure: CARDIOVERSION;  Surgeon: [name], MD;  Location: [facility];  Service: Cardiovascular;  Laterality: N/A;  . CARDIOVERSION N/A 09/10/2011   Procedure: CARDIOVERSION;  Surgeon: [name], MD;  Location: [facility] CATH LAB;  Service: Cardiovascular;  Laterality: N/A;  . CHOLECYSTECTOMY    . TONSILLECTOMY  1982  . TOTAL ABDOMINAL HYSTERECTOMY       OB History   No obstetric history on file.     Family History  Problem Relation Age of Onset  . Heart disease Father   . Hypertension Father   . Breast cancer Sister   . Cancer Sister        breast  Social History   Tobacco Use  . Smoking status: Never Smoker  . Smokeless tobacco: Never Used  Vaping Use  . Vaping Use: Never used  Substance Use Topics  . Alcohol use: No  . Drug use: No    Home Medications Prior to Admission medications   Medication Sig Start Date End Date Taking? Authorizing Provider  Accu-Chek Softclix Lancets lancets 1 each by Other route 3 (three) times daily. as directed 03/30/20   Maximiano Coss, NP  allopurinol (ZYLOPRIM) 300 MG tablet Take 300 mg by mouth daily.  07/07/13   [provider]  amiodarone (PACERONE) 200 MG tablet TAKE 1 TABLET BY MOUTH EVERY DAY. [place] ONLY 03/16/20   [name], MD  cloNIDine (CATAPRES) 0.2 MG tablet  Take 0.2 mg by mouth 2 (two) times daily.    [provider]  Continuous Blood Gluc Receiver (FREESTYLE LIBRE 14 DAY READER) DEVI Use as directed. 04/18/20   Horald Pollen, MD  diltiazem (CARDIZEM CD) 120 MG 24 hr capsule Take one capsule daily; may take twice a day during afib episode 10/17/19   [name], MD  gabapentin (NEURONTIN) 300 MG capsule Take 1 capsule (300 mg total) by mouth 3 (three) times daily. 02/28/20   Maximiano Coss, NP  Insulin Lispro Prot & Lispro (HUMALOG 75/25 MIX) (75-25) 100 UNIT/ML Kwikpen Inject 0-56 Units into the skin 3 (three) times daily as needed (high blood sugar). Sliding Scale 08/12/16   [provider]  isosorbide mononitrate (IMDUR) 30 MG 24 hr tablet Take 0.5 tablets (15 mg total) by mouth daily. 02/21/20   Shirley Friar, PA-C  levothyroxine (SYNTHROID, LEVOTHROID) 75 MCG tablet Take 75 mcg daily before breakfast by mouth.  12/29/16   [provider]  lisinopril (ZESTRIL) 40 MG tablet Take 60 mg by mouth daily.  12/02/19   [provider]  loperamide (IMODIUM) 2 MG capsule Take 1 capsule (2 mg total) by mouth 4 (four) times daily as needed for diarrhea or loose stools. 03/20/20   Sharyn Lull A, PA-C  NOVOFINE PLUS 32G X 4 MM MISC 1 each by Subdermal route 2 (two) times daily. as directed 03/30/20   Maximiano Coss, NP  potassium chloride (KLOR-CON) 10 MEQ tablet Take 2 tablets (20 mEq total) by mouth daily. 10/18/19   [name], MD  pregabalin (LYRICA) 25 MG capsule Take 1 capsule (25 mg total) by mouth 2 (two) times daily. 03/07/20   Maximiano Coss, NP  RESTASIS MULTIDOSE 0.05 % ophthalmic emulsion Place 1 drop into both eyes 2 (two) times daily.  03/04/20   [provider]  rosuvastatin (CRESTOR) 10 MG tablet Take 10 mg by mouth every evening.    [provider]  Semaglutide,0.25 or 0.5MG /DOS, (OZEMPIC, 0.25 OR 0.5 MG/DOSE,) 2 MG/1.5ML SOPN Inject 0.75 mLs (1 mg total) into the skin once  a week. 04/18/20   Horald Pollen, MD  torsemide (DEMADEX) 20 MG tablet Take 2 tablets (40 mg total) by mouth daily. 09/29/19   [name], MD  warfarin (COUMADIN) 5 MG tablet TAKE 1/2 TABLET BY MOUTH ON MONDAY, WEDNESDAY AND FRIDAY AND 1 TABLET BY MOUTH ON ALL OTHER DAYS AS DIRECTED BY COUMADIN CLINIC Patient taking differently: Take 2.5-5 mg by mouth as directed. TAKE 1/2 TABLET (2.5 MG) BY MOUTH ON MONDAY, WEDNESDAY AND FRIDAY AND 1 TABLET BY MOUTH ON ALL OTHER DAYS AS DIRECTED BY COUMADIN CLINIC 02/06/20   [name], MD  Allergies    Metformin  Review of Systems   Review of Systems  Constitutional: Positive for activity change.  Respiratory: Negative for shortness of breath.   Cardiovascular: Negative for chest pain.  Gastrointestinal: Negative for nausea and vomiting.  Allergic/Immunologic: Negative for immunocompromised state.  Hematological: Bruises/bleeds easily.  All other systems reviewed and are negative.   Physical Exam Updated Vital Signs BP (!) 114/55 (BP Location: Left Arm)   Pulse 64   Temp 98 F (36.7 C) (Oral)   Resp 16   SpO2 97%   Physical Exam Vitals and nursing note reviewed.  Constitutional:      Appearance: She is well-developed.  HENT:     Head: Normocephalic and atraumatic.  Eyes:     Pupils: Pupils are equal, round, and reactive to light.  Neck:     Comments: No midline c-spine tenderness Cardiovascular:     Rate and Rhythm: Normal rate and regular rhythm.  Pulmonary:     Effort: Pulmonary effort is normal.     Breath sounds: Normal breath sounds.  Abdominal:     General: Bowel sounds are normal.     Palpations: Abdomen is soft.  Musculoskeletal:     Cervical back: Neck supple.     Comments: No long bone tenderness - upper and lower extrmeities and no pelvic pain, instability.  Skin:    General: Skin is warm.  Neurological:     Mental Status: She is alert and oriented to person, place, and time.     ED Results /  Procedures / Treatments   Labs (all labs ordered are listed, but only abnormal results are displayed) Labs Reviewed  BASIC METABOLIC PANEL - Abnormal; Notable for the following components:      Result Value   Glucose, Bld 139 (*)    All other components within normal limits  CBC - Abnormal; Notable for the following components:   WBC 12.6 (*)    RDW 16.7 (*)    All other components within normal limits  GLUCOSE, CAPILLARY - Abnormal; Notable for the following components:   Glucose-Capillary 167 (*)    All other components within normal limits  PROTIME-INR - Abnormal; Notable for the following components:   Prothrombin Time 21.0 (*)    INR 1.9 (*)    All other components within normal limits  CBG MONITORING, ED    EKG EKG Interpretation  Date/Time:  Tuesday May 01 2020 12:50:38 EDT Ventricular Rate:  64 PR Interval:    QRS Duration: 83 QT Interval:  446 QTC Calculation: 461 R Axis:   7 Text Interpretation: Sinus or ectopic atrial rhythm Borderline short PR interval Low voltage, precordial leads No acute changes No significant change since last tracing Confirmed by Varney Biles ([id number]) on 05/01/2020 2:45:48 PM   Radiology DG Chest 2 View  Result Date: 05/01/2020 CLINICAL DATA:  Atrial fibrillation. EXAM: CHEST - 2 VIEW COMPARISON:  Chest x-ray dated January 05, 2019. FINDINGS: The heart is at the upper limits of normal in size. Atherosclerotic calcification of the aortic arch. Normal pulmonary vascularity. Unchanged chronic linear scarring in the right middle lobe. No focal consolidation, pleural effusion, or pneumothorax. No acute osseous abnormality. IMPRESSION: 1. No acute cardiopulmonary disease. Electronically Signed   By: Titus Dubin M.D.   On: 05/01/2020 12:58    Procedures Procedures (including critical care time)  Medications Ordered in ED Medications - No data to display  ED Course  I have reviewed the triage vital signs and the nursing  notes.  Pertinent  labs & imaging results that were available during my care of the patient were reviewed by me and considered in my medical decision making (see chart for details).    MDM Rules/Calculators/A&P                          79 year old comes in with chief complaint of palpitations. She is noted to be in sinus rhythm. She reports that her heart rate was in the 150s at home. History is not suggestive of any underlying cause as far as infection or medication changes.   4:57 PM CHA2DS2/VAS Stroke Risk Points  Current as of 6 minutes ago     6 >= 2 Points: High Risk  1 - 1.99 Points: Medium Risk  0 Points: Low Risk    Last Change: N/A      Details    This score determines the patient's risk of having a stroke if the  patient has atrial fibrillation.       Points Metrics  1 Has Congestive Heart Failure:  Yes    Current as of 6 minutes ago  0 Has Vascular Disease:  No    Current as of 6 minutes ago  1 Has Hypertension:  Yes    Current as of 6 minutes ago  2 Age:  57    Current as of 6 minutes ago  1 Has Diabetes:  Yes    Current as of 6 minutes ago  0 Had Stroke:  No  Had TIA:  No  Had thromboembolism:  No    Current as of 6 minutes ago  1 Female:  Yes    Current as of 6 minutes ago   Final Clinical Impression(s) / ED Diagnoses Final diagnoses:  PAF (paroxysmal atrial fibrillation) ([place])    Rx / DC Orders ED Discharge Orders    None       Varney Biles, MD 05/01/20 1657

## 2020-05-01 NOTE — ED Triage Notes (Addendum)
Pt BIBA from home. Pt states at 0200, she felt her heart racing at 140 BPM. Pt called nurse line, who advised her to call EMS. Pt's heart rate was 72 with EMS. No complaints at this time.  FSBG 229. Pt gave herself her insulin just before EMS took her glucose.

## 2020-05-01 NOTE — Telephone Encounter (Signed)
[name] with [employer] calling to alert [place] to the fact that pt was recommended to go to ER because today's visit with her heart rate was 122

## 2020-05-02 DIAGNOSIS — G4733 Obstructive sleep apnea (adult) (pediatric): Secondary | ICD-10-CM | POA: Diagnosis not present

## 2020-05-03 ENCOUNTER — Telehealth: Payer: Self-pay | Admitting: Emergency Medicine

## 2020-05-03 ENCOUNTER — Other Ambulatory Visit: Payer: Self-pay | Admitting: Emergency Medicine

## 2020-05-03 DIAGNOSIS — E1165 Type 2 diabetes mellitus with hyperglycemia: Secondary | ICD-10-CM

## 2020-05-03 MED ORDER — BLOOD GLUCOSE METER KIT: PACK | 0 refills | Status: DC

## 2020-05-03 NOTE — Telephone Encounter (Signed)
New glucose meter has been faxed to [facility]

## 2020-05-03 NOTE — Telephone Encounter (Signed)
[employer] called and stated pt needs a continuous glucose monitor CGM  Stated it can be sent in to pharmacy or to [place] number: [phone] Please advise.

## 2020-05-09 ENCOUNTER — Other Ambulatory Visit: Payer: Self-pay

## 2020-05-09 ENCOUNTER — Encounter (HOSPITAL_COMMUNITY): Payer: Self-pay | Admitting: Nurse Practitioner

## 2020-05-09 ENCOUNTER — Ambulatory Visit (HOSPITAL_COMMUNITY)
Admission: RE | Admit: 2020-05-09 | Discharge: 2020-05-09 | Disposition: A | Discharge status: Home / Self Care | Payer: Medicare Other | Source: Ambulatory Visit | Attending: Nurse Practitioner | Admitting: Nurse Practitioner

## 2020-05-09 VITALS — BP 150/66 | HR 61 | Ht <= 58 in | Wt 257.8 lb

## 2020-05-09 DIAGNOSIS — Z79899 Other long term (current) drug therapy: Secondary | ICD-10-CM | POA: Insufficient documentation

## 2020-05-09 DIAGNOSIS — N189 Chronic kidney disease, unspecified: Secondary | ICD-10-CM | POA: Insufficient documentation

## 2020-05-09 DIAGNOSIS — I130 Hypertensive heart and chronic kidney disease with heart failure and stage 1 through stage 4 chronic kidney disease, or unspecified chronic kidney disease: Secondary | ICD-10-CM | POA: Diagnosis not present

## 2020-05-09 DIAGNOSIS — Z7901 Long term (current) use of anticoagulants: Secondary | ICD-10-CM | POA: Insufficient documentation

## 2020-05-09 DIAGNOSIS — E1122 Type 2 diabetes mellitus with diabetic chronic kidney disease: Secondary | ICD-10-CM | POA: Insufficient documentation

## 2020-05-09 DIAGNOSIS — I480 Paroxysmal atrial fibrillation: Secondary | ICD-10-CM | POA: Diagnosis not present

## 2020-05-09 DIAGNOSIS — Z6841 Body Mass Index (BMI) 40.0 and over, adult: Secondary | ICD-10-CM | POA: Insufficient documentation

## 2020-05-09 DIAGNOSIS — Z8616 Personal history of COVID-19: Secondary | ICD-10-CM | POA: Diagnosis not present

## 2020-05-09 DIAGNOSIS — G4733 Obstructive sleep apnea (adult) (pediatric): Secondary | ICD-10-CM | POA: Insufficient documentation

## 2020-05-09 DIAGNOSIS — Z794 Long term (current) use of insulin: Secondary | ICD-10-CM | POA: Insufficient documentation

## 2020-05-09 DIAGNOSIS — E785 Hyperlipidemia, unspecified: Secondary | ICD-10-CM | POA: Diagnosis not present

## 2020-05-09 DIAGNOSIS — I5032 Chronic diastolic (congestive) heart failure: Secondary | ICD-10-CM | POA: Insufficient documentation

## 2020-05-09 DIAGNOSIS — D6869 Other thrombophilia: Secondary | ICD-10-CM | POA: Diagnosis not present

## 2020-05-09 DIAGNOSIS — M1990 Unspecified osteoarthritis, unspecified site: Secondary | ICD-10-CM | POA: Insufficient documentation

## 2020-05-09 MED ORDER — DILTIAZEM HCL 30 MG PO TABS
ORAL_TABLET | ORAL | 6 refills | Status: DC
Start: 2020-05-09 — End: 2020-08-15

## 2020-05-09 NOTE — Patient Instructions (Addendum)
Stop the Cardizem 120mg  daily Start Cardizem 30mg  every 4 hours as needed for heart rate greater than 100 and B/p needs to be above 100

## 2020-05-09 NOTE — Progress Notes (Signed)
Primary Care Physician: Horald Pollen, MD Referring Physician:[facility] triage   [name] is a 79 y.o. female with a h/o paroxysmal afib previously on flecainide, dofetilide and most recently on amiodarone. She is being seen today in the afib clinic. She is in SR but does report that she had a ER visit for afib that started around 4 am in the morning with HR's around 150 bpm. She  took her cardizem 120 mg at that time and again at 10  am. She  decided to go to the ER but by the time she was seen, she had returned to [place].   Overall her health has been stable. She had covid in January but she did not require hospitalization.  She remains on amiodarone 200 mg M-F only. TSH/Cmet checked in May  were in [place]. Marland Kitchen Remains on warfarin with CHA2DS2VASc score of at least 5. No bleeding issues. No further issues with afib.    Today, she denies symptoms of palpitations, chest pain, shortness of breath, orthopnea, PND, lower extremity edema, dizziness, presyncope, syncope, or neurologic sequela. The patient is tolerating medications without difficulties and is otherwise without complaint today.   Past Medical History:  Diagnosis Date  . Arthritis   . Back pain   . Chronic anticoagulation    due to aflutter  . Chronic kidney disease   . Diabetes mellitus   . Diastolic CHF, chronic ([place])    a.  echo 2006 - ef 55-65%; mild diast dysfxn;    b. Echo 08/2011: Mild LVH, EF 60%;  c. 04/2013 Echo: EF 65-69%, mild conc LVH;  08/2014 Echo: EF 60-65%, mild-mod MR.  . Gout   . Hyperlipidemia   . Hypertension    a.  Renal arterial Dopplers 12/2011: 1-59% right renal artery stenosis  . Morbid obesity ([place])   . Obstructive sleep apnea on CPAP   . Paroxysmal Afib/Flutter    a. 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   Past Surgical History:  Procedure Laterality Date  . APPENDECTOMY    . ATRIAL FLUTTER ABLATION N/A 09/24/2011   Procedure: ATRIAL FLUTTER ABLATION;  Surgeon: [name], MD;  Location: [facility]  CATH LAB;  Service: Cardiovascular;  Laterality: N/A;  . CARDIOVERSION  10/22/2011   Procedure: CARDIOVERSION;  Surgeon: [name], MD;  Location: [facility];  Service: Cardiovascular;  Laterality: N/A;  . CARDIOVERSION N/A 09/10/2011   Procedure: CARDIOVERSION;  Surgeon: [name], MD;  Location[facility] CATH LAB;  Service: Cardiovascular;  Laterality: N/A;  . CHOLECYSTECTOMY    . TONSILLECTOMY  1982  . TOTAL ABDOMINAL HYSTERECTOMY      Current Outpatient Medications  Medication Sig Dispense Refill  . Accu-Chek Softclix Lancets lancets 1 each by Other route 3 (three) times daily. as directed 100 each 3  . allopurinol (ZYLOPRIM) 300 MG tablet Take 300 mg by mouth daily.     Marland Kitchen amiodarone (PACERONE) 200 MG tablet TAKE 1 TABLET BY MOUTH EVERY DAY. MONDAY THROUGH FRIDAY ONLY 90 tablet 3  . blood glucose meter kit and supplies Dispense based on patient and insurance preference. Use up to four times daily as directed. (FOR ICD-10 E10.9, E11.9). 1 each 0  . cloNIDine (CATAPRES) 0.2 MG tablet Take 0.2 mg by mouth 2 (two) times daily.    . Continuous Blood Gluc Receiver (FREESTYLE LIBRE 14 DAY READER) DEVI Use as directed. 1 each 0  . Insulin Lispro Prot & Lispro (HUMALOG 75/25 MIX) (75-25) 100 UNIT/ML Kwikpen Inject 0-56 Units into  the skin 3 (three) times daily as needed (high blood sugar). Sliding Scale    . levothyroxine (SYNTHROID, LEVOTHROID) 75 MCG tablet Take 75 mcg daily before breakfast by mouth.     Marland Kitchen lisinopril (ZESTRIL) 40 MG tablet Take 60 mg by mouth daily.     Marland Kitchen loperamide (IMODIUM) 2 MG capsule Take 1 capsule (2 mg total) by mouth 4 (four) times daily as needed for diarrhea or loose stools. 12 capsule 0  . NOVOFINE PLUS 32G X 4 MM MISC 1 each by Subdermal route 2 (two) times daily. as directed 60 each 3  . potassium chloride (KLOR-CON) 10 MEQ tablet Take 2 tablets (20 mEq total) by mouth daily. 180 tablet 3  . RESTASIS MULTIDOSE 0.05 % ophthalmic emulsion Place 1 drop into both eyes 2  (two) times daily.     . rosuvastatin (CRESTOR) 10 MG tablet Take 10 mg by mouth every evening.    . Semaglutide,0.25 or 0.5MG/DOS, (OZEMPIC, 0.25 OR 0.5 MG/DOSE,) 2 MG/1.5ML SOPN Inject 0.75 mLs (1 mg total) into the skin once a week. 12 pen 3  . torsemide (DEMADEX) 20 MG tablet Take 2 tablets (40 mg total) by mouth daily. 180 tablet 3  . warfarin (COUMADIN) 5 MG tablet TAKE 1/2 TABLET BY MOUTH ON MONDAY, WEDNESDAY AND FRIDAY AND 1 TABLET BY MOUTH ON ALL OTHER DAYS AS DIRECTED BY COUMADIN CLINIC (Patient taking differently: Take 2.5-5 mg by mouth as directed. TAKE 1/2 TABLET (2.5 MG) BY MOUTH ON MONDAY, WEDNESDAY AND FRIDAY AND 1 TABLET BY MOUTH ON ALL OTHER DAYS AS DIRECTED BY COUMADIN CLINIC) 90 tablet 1   No current facility-administered medications for this encounter.    Allergies  Allergen Reactions  . Metformin Diarrhea    Social History   Socioeconomic History  . Marital status: Married    Spouse name: Not on file  . Number of children: 3  . Years of education: Not on file  . Highest education level: Not on file  Occupational History  . Occupation: DISABILITY/housewife    Employer: RETIRED  Tobacco Use  . Smoking status: Never Smoker  . Smokeless tobacco: Never Used  Vaping Use  . Vaping Use: Never used  Substance and Sexual Activity  . Alcohol use: No  . Drug use: No  . Sexual activity: Yes  Other Topics Concern  . Not on file  Social History Narrative  . Not on file   Social Determinants of Health   Financial Resource Strain:   . Difficulty of Paying Living Expenses:   Food Insecurity:   . Worried About Charity fundraiser in the Last Year:   . Arboriculturist in the Last Year:   Transportation Needs:   . Film/video editor (Medical):   Marland Kitchen Lack of Transportation (Non-Medical):   Physical Activity:   . Days of Exercise per Week:   . Minutes of Exercise per Session:   Stress:   . Feeling of Stress :   Social Connections:   . Frequency of Communication  with Friends and Family:   . Frequency of Social Gatherings with Friends and Family:   . Attends Religious Services:   . Active Member of Clubs or Organizations:   . Attends Archivist Meetings:   Marland Kitchen Marital Status:   Intimate Partner Violence:   . Fear of Current or Ex-Partner:   . Emotionally Abused:   Marland Kitchen Physically Abused:   . Sexually Abused:     Family History  Problem  Relation Age of Onset  . Heart disease Father   . Hypertension Father   . Breast cancer Sister   . Cancer Sister        breast    ROS- All systems are reviewed and negative except as per the HPI above  Physical Exam: Vitals:   05/09/20 1114  BP: (!) 150/66  Pulse: 61  SpO2: 97%  Weight: 116.9 kg  Height: '4\' 10"'  (1.473 m)   Wt Readings from Last 3 Encounters:  05/09/20 116.9 kg  03/28/20 114.5 kg  03/20/20 113.4 kg    Labs: Lab Results  Component Value Date   NA 141 05/01/2020   K 3.5 05/01/2020   CL 98 05/01/2020   CO2 32 05/01/2020   GLUCOSE 139 (H) 05/01/2020   BUN 21 05/01/2020   CREATININE 0.74 05/01/2020   CALCIUM 8.9 05/01/2020   MG 1.9 02/21/2020   Lab Results  Component Value Date   INR 1.9 (H) 05/01/2020   Lab Results  Component Value Date   CHOL 146 02/28/2020   HDL 58 02/28/2020   LDLCALC 52 02/28/2020   TRIG 226 (H) 02/28/2020     GEN- The patient is well appearing, alert and oriented x 3 today.   Head- normocephalic, atraumatic Eyes-  Sclera clear, conjunctiva pink Ears- hearing intact Oropharynx- clear Neck- supple, no JVP Lymph- no cervical lymphadenopathy Lungs- Clear to ausculation bilaterally, normal work of breathing Heart-  regular rate and rhythm, no murmurs, rubs or gallops, PMI not laterally displaced GI- soft, NT, ND, + BS Extremities- no clubbing, cyanosis, or edema MS- no significant deformity or atrophy Skin- no rash or lesion Psych- euthymic mood, full affect Neuro- strength and sensation are intact  EKG- normal sinus rhythm at 61  bpm, normal ekg Epic records reviewed Echo-Study Conclusions  - Left ventricle: The cavity size was normal. Systolic function was   normal. The estimated ejection fraction was in the range of 60%   to 65%. Wall motion was normal; there were no regional wall   motion abnormalities. Features are consistent with a pseudonormal   left ventricular filling pattern, with concomitant abnormal   relaxation and increased filling pressure (grade 2 diastolic   dysfunction). Doppler parameters are consistent with high   ventricular filling pressure. - Aortic valve: Transvalvular velocity was within the normal range.   There was no stenosis. There was no regurgitation. - Mitral valve: Transvalvular velocity was within the normal range.   There was no evidence for stenosis. There was mild regurgitation.   Valve area by pressure half-time: 1.79 cm^2. Valve area by   continuity equation (using LVOT flow): 1.1 cm^2. - Left atrium: The atrium was severely dilated. - Right ventricle: The cavity size was normal. Wall thickness was   normal. Systolic function was normal. - Tricuspid valve: There was trivial regurgitation. - Pulmonary arteries: Systolic pressure was within the normal   range. PA peak pressure: 25 mm Hg (S).    Assessment and Plan: 1. Paroxysmal  afib Continue amiodarone at 200 mg M-F TSH normal limits/cmet WNL checked in May  ER doctor suggested that she use shorting acting Cardizem instead of long acting for break through afib She has been given 30 mg cardizem to use for breakthrough afib for HR over 932 and systolic  BP at least over 355 systolic  for more immediate effect on HR   2. Chadsvasc score of at least 5 Continue warfarin    F/u with Ivar Drape per recall  afib clinic in one year   Geroge Baseman. Littie Chiem, [facility] [address] [place], [place] [address] [phone]

## 2020-05-28 ENCOUNTER — Telehealth: Payer: Self-pay

## 2020-05-28 ENCOUNTER — Telehealth: Payer: Self-pay | Admitting: Emergency Medicine

## 2020-05-28 NOTE — Telephone Encounter (Signed)
[facility] nurse called and ask that we schedule appt asap for this pat .  Someone may want to call pt and check her BP numbers, I scheduled appt 1st available but , for Thursday      Please advise

## 2020-05-28 NOTE — Telephone Encounter (Signed)
Spoke with pt regarding her bp and glucose readings. Concerned because glucose readings have been in the 200-300 range which is not usual for her. Pt has appt to see provider this Thurs.

## 2020-05-31 ENCOUNTER — Ambulatory Visit (INDEPENDENT_AMBULATORY_CARE_PROVIDER_SITE_OTHER): Payer: Medicare Other

## 2020-05-31 ENCOUNTER — Other Ambulatory Visit: Payer: Self-pay

## 2020-05-31 ENCOUNTER — Ambulatory Visit (INDEPENDENT_AMBULATORY_CARE_PROVIDER_SITE_OTHER): Payer: Medicare Other | Admitting: Emergency Medicine

## 2020-05-31 ENCOUNTER — Encounter: Payer: Self-pay | Admitting: Emergency Medicine

## 2020-05-31 VITALS — BP 148/86 | HR 70 | Temp 97.9°F | Resp 16 | Ht 61.0 in | Wt 253.0 lb

## 2020-05-31 DIAGNOSIS — Z6841 Body Mass Index (BMI) 40.0 and over, adult: Secondary | ICD-10-CM | POA: Diagnosis not present

## 2020-05-31 DIAGNOSIS — I10 Essential (primary) hypertension: Secondary | ICD-10-CM | POA: Diagnosis not present

## 2020-05-31 DIAGNOSIS — E1159 Type 2 diabetes mellitus with other circulatory complications: Secondary | ICD-10-CM | POA: Diagnosis not present

## 2020-05-31 DIAGNOSIS — Z5181 Encounter for therapeutic drug level monitoring: Secondary | ICD-10-CM | POA: Diagnosis not present

## 2020-05-31 DIAGNOSIS — I152 Hypertension secondary to endocrine disorders: Secondary | ICD-10-CM

## 2020-05-31 DIAGNOSIS — I480 Paroxysmal atrial fibrillation: Secondary | ICD-10-CM | POA: Diagnosis not present

## 2020-05-31 LAB — POCT INR: INR: 1.6 — AB (ref 2.0–3.0)

## 2020-05-31 MED ORDER — LOSARTAN POTASSIUM 100 MG PO TABS: 100.0000 mg | ORAL_TABLET | Freq: Every day | ORAL | 3 refills | Status: DC

## 2020-05-31 NOTE — Patient Instructions (Addendum)
Stop lisinopril.  Start losartan 100 mg daily. Continue all other medications. Follow-up with endocrinologist as scheduled. Follow-up with cardiologist as scheduled.   If you have lab work done today you will be contacted with your lab results within the next 2 weeks.  If you have not heard from [place] then please contact us. The fastest way to get your results is to register for My Chart.   IF you received an x-ray today, you will receive an invoice from [facility]. Please contact [facility] at [phone] with questions or concerns regarding your invoice.   IF you received labwork today, you will receive an invoice from [facility]. Please contact [facility] at [phone] with questions or concerns regarding your invoice.   Our billing staff will not be able to assist you with questions regarding bills from these companies.  You will be contacted with the lab results as soon as they are available. The fastest way to get your results is to activate your My Chart account. Instructions are located on the last page of this paperwork. If you have not heard from [place] regarding the results in 2 weeks, please contact this office.     Hypertension, Adult High blood pressure (hypertension) is when the force of blood pumping through the arteries is too strong. The arteries are the blood vessels that carry blood from the heart throughout the body. Hypertension forces the heart to work harder to pump blood and may cause arteries to become narrow or stiff. Untreated or uncontrolled hypertension can cause a heart attack, heart failure, a stroke, kidney disease, and other problems. A blood pressure reading consists of a higher number over a lower number. Ideally, your blood pressure should be below 120/80. The first ("top") number is called the systolic pressure. It is a measure of the pressure in your arteries as your heart beats. The second ("bottom") number is called the diastolic pressure.  It is a measure of the pressure in your arteries as the heart relaxes. What are the causes? The exact cause of this condition is not known. There are some conditions that result in or are related to high blood pressure. What increases the risk? Some risk factors for high blood pressure are under your control. The following factors may make you more likely to develop this condition:  Smoking.  Having type 2 diabetes mellitus, high cholesterol, or both.  Not getting enough exercise or physical activity.  Being overweight.  Having too much fat, sugar, calories, or salt (sodium) in your diet.  Drinking too much alcohol. Some risk factors for high blood pressure may be difficult or impossible to change. Some of these factors include:  Having chronic kidney disease.  Having a family history of high blood pressure.  Age. Risk increases with age.  Race. You may be at higher risk if you are African American.  Gender. Men are at higher risk than women before age 36. After age 69, women are at higher risk than men.  Having obstructive sleep apnea.  Stress. What are the signs or symptoms? High blood pressure may not cause symptoms. Very high blood pressure (hypertensive crisis) may cause:  Headache.  Anxiety.  Shortness of breath.  Nosebleed.  Nausea and vomiting.  Vision changes.  Severe chest pain.  Seizures. How is this diagnosed? This condition is diagnosed by measuring your blood pressure while you are seated, with your arm resting on a flat surface, your legs uncrossed, and your feet flat on the floor. The cuff of  the blood pressure monitor will be placed directly against the skin of your upper arm at the level of your heart. It should be measured at least twice using the same arm. Certain conditions can cause a difference in blood pressure between your right and left arms. Certain factors can cause blood pressure readings to be lower or higher than normal for a short  period of time:  When your blood pressure is higher when you are in a health care provider's office than when you are at home, this is called white coat hypertension. Most people with this condition do not need medicines.  When your blood pressure is higher at home than when you are in a health care provider's office, this is called masked hypertension. Most people with this condition may need medicines to control blood pressure. If you have a high blood pressure reading during one visit or you have normal blood pressure with other risk factors, you may be asked to:  Return on a different day to have your blood pressure checked again.  Monitor your blood pressure at home for 1 week or longer. If you are diagnosed with hypertension, you may have other blood or imaging tests to help your health care provider understand your overall risk for other conditions. How is this treated? This condition is treated by making healthy lifestyle changes, such as eating healthy foods, exercising more, and reducing your alcohol intake. Your health care provider may prescribe medicine if lifestyle changes are not enough to get your blood pressure under control, and if:  Your systolic blood pressure is above 130.  Your diastolic blood pressure is above 80. Your personal target blood pressure may vary depending on your medical conditions, your age, and other factors. Follow these instructions at home: Eating and drinking   Eat a diet that is high in fiber and potassium, and low in sodium, added sugar, and fat. An example eating plan is called the DASH (Dietary Approaches to Stop Hypertension) diet. To eat this way: ? Eat plenty of fresh fruits and vegetables. Try to fill one half of your plate at each meal with fruits and vegetables. ? Eat whole grains, such as whole-wheat pasta, brown rice, or whole-grain bread. Fill about one fourth of your plate with whole grains. ? Eat or drink low-fat dairy products, such  as skim milk or low-fat yogurt. ? Avoid fatty cuts of meat, processed or cured meats, and poultry with skin. Fill about one fourth of your plate with lean proteins, such as fish, chicken without skin, beans, eggs, or tofu. ? Avoid pre-made and processed foods. These tend to be higher in sodium, added sugar, and fat.  Reduce your daily sodium intake. Most people with hypertension should eat less than 1,500 mg of sodium a day.  Do not drink alcohol if: ? Your health care provider tells you not to drink. ? You are pregnant, may be pregnant, or are planning to become pregnant.  If you drink alcohol: ? Limit how much you use to:  0-1 drink a day for women.  0-2 drinks a day for men. ? Be aware of how much alcohol is in your drink. In the [place] one drink equals one 12 oz bottle of beer (355 mL), one 5 oz glass of wine (148 mL), or one 1 oz glass of hard liquor (44 mL). Lifestyle   Work with your health care provider to maintain a healthy body weight or to lose weight. Ask what an ideal weight is  for you.  Get at least 30 minutes of exercise most days of the week. Activities may include walking, swimming, or biking.  Include exercise to strengthen your muscles (resistance exercise), such as Pilates or lifting weights, as part of your weekly exercise routine. Try to do these types of exercises for 30 minutes at least 3 days a week.  Do not use any products that contain nicotine or tobacco, such as cigarettes, e-cigarettes, and chewing tobacco. If you need help quitting, ask your health care provider.  Monitor your blood pressure at home as told by your health care provider.  Keep all follow-up visits as told by your health care provider. This is important. Medicines  Take over-the-counter and prescription medicines only as told by your health care provider. Follow directions carefully. Blood pressure medicines must be taken as prescribed.  Do not skip doses of blood pressure medicine.  Doing this puts you at risk for problems and can make the medicine less effective.  Ask your health care provider about side effects or reactions to medicines that you should watch for. Contact a health care provider if you:  Think you are having a reaction to a medicine you are taking.  Have headaches that keep coming back (recurring).  Feel dizzy.  Have swelling in your ankles.  Have trouble with your vision. Get help right away if you:  Develop a severe headache or confusion.  Have unusual weakness or numbness.  Feel faint.  Have severe pain in your chest or abdomen.  Vomit repeatedly.  Have trouble breathing. Summary  Hypertension is when the force of blood pumping through your arteries is too strong. If this condition is not controlled, it may put you at risk for serious complications.  Your personal target blood pressure may vary depending on your medical conditions, your age, and other factors. For most people, a normal blood pressure is less than 120/80.  Hypertension is treated with lifestyle changes, medicines, or a combination of both. Lifestyle changes include losing weight, eating a healthy, low-sodium diet, exercising more, and limiting alcohol. This information is not intended to replace advice given to you by your health care provider. Make sure you discuss any questions you have with your health care provider. Document Revised: 06/16/2018 Document Reviewed: 06/16/2018 [employer] Patient Education  2020 [employer].

## 2020-05-31 NOTE — Progress Notes (Signed)
[name] 79 y.o.   Chief Complaint  Patient presents with  . Hypertension    per patient one week ago today they have been crazy - bp and sugar     HISTORY OF PRESENT ILLNESS: This is a 79 y.o. female with history of diabetes and hypertension here for follow-up.  States her diabetes is under control and her blood pressure has been high.  Not taking Ozempic as directed.  Not taking lisinopril as directed, only taking 40 mg.  She was directed to take 60 mg daily.  Taking Cardizem only intermittently as needed. Has multiple chronic medical problems as follows: 1. Hypertension: On clonidine, Cardizem CD and lisinopril. Did not take blood pressure medication this morning. Was rushing to come to the office. 2. Paroxysmal atrial fibrillation: On long-term anticoagulation with Coumadin. Goes to Coumadin clinic. Also on amiodarone. 3. Congestive heart failure: Sees cardiologist on a regular basis. Takes torsemide. 4. Diabetes insulin-dependent. Sees endocrinologist on a regular basis. Also takes Ozempic weekly. On statin therapy with rosuvastatin 10 mg daily. Intolerant to Metformin. 5. Obstructive sleep apnea on CPAP treatment 6. Chronic kidney disease: Stable 7. History of gout: On allopurinol 300 mg daily. Lab Results  Component Value Date   HGBA1C 7.9 (A) 03/28/2020   BP Readings from Last 3 Encounters:  05/31/20 (!) 173/69  05/09/20 (!) 150/66  05/01/20 (!) 120/53    HPI   Prior to Admission medications   Medication Sig Start Date End Date Taking? Authorizing Provider  allopurinol (ZYLOPRIM) 300 MG tablet Take 300 mg by mouth daily.  07/07/13  Yes [provider]  amiodarone (PACERONE) 200 MG tablet TAKE 1 TABLET BY MOUTH EVERY DAY. [place] ONLY 03/16/20  Yes [name], MD  cloNIDine (CATAPRES) 0.2 MG tablet Take 0.2 mg by mouth 2 (two) times daily.   Yes [provider]  Insulin Lispro Prot & Lispro (HUMALOG 75/25 MIX)  (75-25) 100 UNIT/ML Kwikpen Inject 0-56 Units into the skin 3 (three) times daily as needed (high blood sugar). Sliding Scale 08/12/16  Yes [provider]  levothyroxine (SYNTHROID, LEVOTHROID) 75 MCG tablet Take 75 mcg daily before breakfast by mouth.  12/29/16  Yes [provider]  lisinopril (ZESTRIL) 40 MG tablet Take 60 mg by mouth daily.  12/02/19  Yes [provider]  potassium chloride (KLOR-CON) 10 MEQ tablet Take 2 tablets (20 mEq total) by mouth daily. 10/18/19  Yes [name], MD  RESTASIS MULTIDOSE 0.05 % ophthalmic emulsion Place 1 drop into both eyes 2 (two) times daily.  03/04/20  Yes [provider]  rosuvastatin (CRESTOR) 10 MG tablet Take 10 mg by mouth every evening.   Yes [provider]  torsemide (DEMADEX) 20 MG tablet Take 2 tablets (40 mg total) by mouth daily. 09/29/19  Yes [name], MD  warfarin (COUMADIN) 5 MG tablet TAKE 1/2 TABLET BY MOUTH ON MONDAY, WEDNESDAY AND FRIDAY AND 1 TABLET BY MOUTH ON ALL OTHER DAYS AS DIRECTED BY COUMADIN CLINIC Patient taking differently: Take 2.5-5 mg by mouth as directed. TAKE 1/2 TABLET (2.5 MG) BY MOUTH ON MONDAY, WEDNESDAY AND FRIDAY AND 1 TABLET BY MOUTH ON ALL OTHER DAYS AS DIRECTED BY COUMADIN CLINIC 02/06/20  Yes [name], MD  Accu-Chek Softclix Lancets lancets 1 each by Other route 3 (three) times daily. as directed 03/30/20   Maximiano Coss, NP  blood glucose meter kit and supplies Dispense based on patient and insurance preference. Use up to four  times daily as directed. (FOR ICD-10 E10.9, E11.9). 05/03/20   Horald Pollen, MD  Continuous Blood Gluc Receiver (FREESTYLE LIBRE 14 DAY READER) DEVI Use as directed. 04/18/20   Horald Pollen, MD  diltiazem (CARDIZEM) 30 MG tablet Take one tablet every 4 hours as needed for heart rate greater than 100 and blood pressure needs to be above 100 Patient not taking: Reported on 05/31/2020 05/09/20   Sherran Needs, NP    loperamide (IMODIUM) 2 MG capsule Take 1 capsule (2 mg total) by mouth 4 (four) times daily as needed for diarrhea or loose stools. Patient not taking: Reported on 05/31/2020 03/20/20   Sharyn Lull A, PA-C  NOVOFINE PLUS 32G X 4 MM MISC 1 each by Subdermal route 2 (two) times daily. as directed 03/30/20   Maximiano Coss, NP  Semaglutide,0.25 or 0.5MG/DOS, (OZEMPIC, 0.25 OR 0.5 MG/DOSE,) 2 MG/1.5ML SOPN Inject 0.75 mLs (1 mg total) into the skin once a week. Patient not taking: Reported on 05/31/2020 04/18/20   Horald Pollen, MD    Allergies  Allergen Reactions  . Metformin Diarrhea    Patient Active Problem List   Diagnosis Date Noted  . Aortic atherosclerosis ([place]) 03/28/2020  . Obesity 12/19/2015  . Hypersomnia 12/19/2015  . Acute diastolic congestive heart failure, NYHA class 3 ([place]) 10/03/2013  . Prolonged QT interval 10/03/2013  . Acute diastolic heart failure ([place]) 10/03/2013  . Long term (current) use of anticoagulants 06/09/2012  . BENIGN NEOPLASM OF ADRENAL GLAND 11/25/2010  . Chronic diastolic heart failure ([place]) 02/20/2010  . DM 05/16/2009  . GOUT 05/16/2009  . OBESITY, MORBID 05/16/2009  . Essential hypertension 05/16/2009  . Paroxysmal atrial fibrillation ([place]) 05/16/2009  . HYPERLIPIDEMIA 11/30/2008  . Obstructive sleep apnea on CPAP 11/30/2008    Past Medical History:  Diagnosis Date  . Arthritis   . Back pain   . Chronic anticoagulation    due to aflutter  . Chronic kidney disease   . Diabetes mellitus   . Diastolic CHF, chronic ([place])    a.  echo 2006 - ef 55-65%; mild diast dysfxn;    b. Echo 08/2011: Mild LVH, EF 60%;  c. 04/2013 Echo: EF 65-69%, mild conc LVH;  08/2014 Echo: EF 60-65%, mild-mod MR.  . Gout   . Hyperlipidemia   . Hypertension    a.  Renal arterial Dopplers 12/2011: 1-59% right renal artery stenosis  . Morbid obesity ([place])   . Obstructive sleep apnea on CPAP   . Paroxysmal Afib/Flutter    a. 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 APPENDECTOMY    . ATRIAL FLUTTER ABLATION N/A 09/24/2011   Procedure: ATRIAL FLUTTER ABLATION;  Surgeon: [name], MD;  Location: [facility] CATH LAB;  Service: Cardiovascular;  Laterality: N/A;  . CARDIOVERSION  10/22/2011   Procedure: CARDIOVERSION;  Surgeon: [name], MD;  Location: [place];  Service: Cardiovascular;  Laterality: N/A;  . CARDIOVERSION N/A 09/10/2011   Procedure: CARDIOVERSION;  Surgeon: [name], MD;  Location[facility] CATH LAB;  Service: Cardiovascular;  Laterality: N/A;  . CHOLECYSTECTOMY    . TONSILLECTOMY  1982  . TOTAL ABDOMINAL HYSTERECTOMY      Social History   Socioeconomic History  . Marital status: Married    Spouse name: Not on file  . Number of children: 3  . Years of education: Not on file  . Highest education level: Not on file  Occupational  History  . Occupation: DISABILITY/housewife    Employer: RETIRED  Tobacco Use  . Smoking status: Never Smoker  . Smokeless tobacco: Never Used  Vaping Use  . Vaping Use: Never used  Substance and Sexual Activity  . Alcohol use: No  . Drug use: No  . Sexual activity: Yes  Other Topics Concern  . Not on file  Social History Narrative  . Not on file   Social Determinants of Health   Financial Resource Strain:   . Difficulty of Paying Living Expenses:   Food Insecurity:   . Worried About Charity fundraiser in the Last Year:   . Arboriculturist in the Last Year:   Transportation Needs:   . Film/video editor (Medical):   Marland Kitchen Lack of Transportation (Non-Medical):   Physical Activity:   . Days of Exercise per Week:   . Minutes of Exercise per Session:   Stress:   . Feeling of Stress :   Social Connections:   . Frequency of Communication with Friends and Family:   . Frequency of Social Gatherings with Friends and Family:   . Attends Religious Services:   . Active Member of Clubs or Organizations:   . Attends Archivist  Meetings:   Marland Kitchen Marital Status:   Intimate Partner Violence:   . Fear of Current or Ex-Partner:   . Emotionally Abused:   Marland Kitchen Physically Abused:   . Sexually Abused:     Family History  Problem Relation Age of Onset  . Heart disease Father   . Hypertension Father   . Breast cancer Sister   . Cancer Sister        breast     Review of Systems  Constitutional: Negative.  Negative for chills and fever.  HENT: Negative.  Negative for congestion and sore throat.   Respiratory: Negative.  Negative for cough and shortness of breath.   Cardiovascular: Negative.  Negative for chest pain and palpitations.  Gastrointestinal: Negative for abdominal pain, nausea and vomiting.  Genitourinary: Negative.  Negative for dysuria and hematuria.  Skin: Negative.  Negative for rash.  Neurological: Negative for dizziness and headaches.  All other systems reviewed and are negative.   Today's Vitals   05/31/20 1134 05/31/20 1218  BP: (!) 173/69 (!) 148/86  Pulse: 70   Resp: 16   Temp: 97.9 F (36.6 C)   TempSrc: Temporal   SpO2: 95%   Weight: 253 lb (114.8 kg)   Height: _0  (1.549 m)    Body mass index is 47.8 kg/m.  Physical Exam Vitals reviewed.  Constitutional:      Appearance: Normal appearance. She is obese.  HENT:     Head: Normocephalic.  Eyes:     Extraocular Movements: Extraocular movements intact.     Pupils: Pupils are equal, round, and reactive to light.  Cardiovascular:     Rate and Rhythm: Normal rate and regular rhythm.     Pulses: Normal pulses.     Heart sounds: Normal heart sounds.  Pulmonary:     Effort: Pulmonary effort is normal.     Breath sounds: Normal breath sounds.  Abdominal:     General: There is no distension.     Palpations: Abdomen is soft.     Tenderness: There is no abdominal tenderness.  Musculoskeletal:        General: Normal range of motion.     Cervical back: Normal range of motion and neck supple.  Skin:  General: Skin is warm and dry.   Neurological:     Mental Status: She is alert.      ASSESSMENT & PLAN: Hypertension associated with diabetes ([place]) Uncontrolled hypertension.  Noncompliant with medication.  Stop lisinopril and start losartan 100 mg daily.  Continue all other medications. Blood sugar readings at home between 120 and 200.  Patient has been taking insulin only.  Not taking Ozempic as recommended.  Advised to start Ozempic and follow-up with endocrinologist as scheduled. Diet and nutrition discussed. Follow-up in 6 months.   Ghada was seen today for hypertension.  Diagnoses and all orders for this visit:  Hypertension associated with diabetes ([place]) -     losartan (COZAAR) 100 MG tablet; Take 1 tablet (100 mg total) by mouth daily.  Body mass index (BMI) of 45.0-49.9 in adult [facility])    Patient Instructions    Stop lisinopril.  Start losartan 100 mg daily. Continue all other medications. Follow-up with endocrinologist as scheduled. Follow-up with cardiologist as scheduled.   If you have lab work done today you will be contacted with your lab results within the next 2 weeks.  If you have not heard from [place] then please contact us. The fastest way to get your results is to register for My Chart.   IF you received an x-ray today, you will receive an invoice from [facility]. Please contact [facility] at [phone] with questions or concerns regarding your invoice.   IF you received labwork today, you will receive an invoice from [facility]. Please contact [facility] at [phone] with questions or concerns regarding your invoice.   Our billing staff will not be able to assist you with questions regarding bills from these companies.  You will be contacted with the lab results as soon as they are available. The fastest way to get your results is to activate your My Chart account. Instructions are located on the last page of this paperwork. If you have not heard from [place] regarding  the results in 2 weeks, please contact this office.     Hypertension, Adult High blood pressure (hypertension) is when the force of blood pumping through the arteries is too strong. The arteries are the blood vessels that carry blood from the heart throughout the body. Hypertension forces the heart to work harder to pump blood and may cause arteries to become narrow or stiff. Untreated or uncontrolled hypertension can cause a heart attack, heart failure, a stroke, kidney disease, and other problems. A blood pressure reading consists of a higher number over a lower number. Ideally, your blood pressure should be below 120/80. The first ("top") number is called the systolic pressure. It is a measure of the pressure in your arteries as your heart beats. The second ("bottom") number is called the diastolic pressure. It is a measure of the pressure in your arteries as the heart relaxes. What are the causes? The exact cause of this condition is not known. There are some conditions that result in or are related to high blood pressure. What increases the risk? Some risk factors for high blood pressure are under your control. The following factors may make you more likely to develop this condition:  Smoking.  Having type 2 diabetes mellitus, high cholesterol, or both.  Not getting enough exercise or physical activity.  Being overweight.  Having too much fat, sugar, calories, or salt (sodium) in your diet.  Drinking too much alcohol. Some risk factors for high blood pressure may be difficult or impossible  to change. Some of these factors include:  Having chronic kidney disease.  Having a family history of high blood pressure.  Age. Risk increases with age.  Race. You may be at higher risk if you are African American.  Gender. Men are at higher risk than women before age 33. After age 26, women are at higher risk than men.  Having obstructive sleep apnea.  Stress. What are the signs or  symptoms? High blood pressure may not cause symptoms. Very high blood pressure (hypertensive crisis) may cause:  Headache.  Anxiety.  Shortness of breath.  Nosebleed.  Nausea and vomiting.  Vision changes.  Severe chest pain.  Seizures. How is this diagnosed? This condition is diagnosed by measuring your blood pressure while you are seated, with your arm resting on a flat surface, your legs uncrossed, and your feet flat on the floor. The cuff of the blood pressure monitor will be placed directly against the skin of your upper arm at the level of your heart. It should be measured at least twice using the same arm. Certain conditions can cause a difference in blood pressure between your right and left arms. Certain factors can cause blood pressure readings to be lower or higher than normal for a short period of time:  When your blood pressure is higher when you are in a health care provider's office than when you are at home, this is called white coat hypertension. Most people with this condition do not need medicines.  When your blood pressure is higher at home than when you are in a health care provider's office, this is called masked hypertension. Most people with this condition may need medicines to control blood pressure. If you have a high blood pressure reading during one visit or you have normal blood pressure with other risk factors, you may be asked to:  Return on a different day to have your blood pressure checked again.  Monitor your blood pressure at home for 1 week or longer. If you are diagnosed with hypertension, you may have other blood or imaging tests to help your health care provider understand your overall risk for other conditions. How is this treated? This condition is treated by making healthy lifestyle changes, such as eating healthy foods, exercising more, and reducing your alcohol intake. Your health care provider may prescribe medicine if lifestyle changes  are not enough to get your blood pressure under control, and if:  Your systolic blood pressure is above 130.  Your diastolic blood pressure is above 80. Your personal target blood pressure may vary depending on your medical conditions, your age, and other factors. Follow these instructions at home: Eating and drinking   Eat a diet that is high in fiber and potassium, and low in sodium, added sugar, and fat. An example eating plan is called the DASH (Dietary Approaches to Stop Hypertension) diet. To eat this way: ? Eat plenty of fresh fruits and vegetables. Try to fill one half of your plate at each meal with fruits and vegetables. ? Eat whole grains, such as whole-wheat pasta, brown rice, or whole-grain bread. Fill about one fourth of your plate with whole grains. ? Eat or drink low-fat dairy products, such as skim milk or low-fat yogurt. ? Avoid fatty cuts of meat, processed or cured meats, and poultry with skin. Fill about one fourth of your plate with lean proteins, such as fish, chicken without skin, beans, eggs, or tofu. ? Avoid pre-made and processed foods. These tend to  be higher in sodium, added sugar, and fat.  Reduce your daily sodium intake. Most people with hypertension should eat less than 1,500 mg of sodium a day.  Do not drink alcohol if: ? Your health care provider tells you not to drink. ? You are pregnant, may be pregnant, or are planning to become pregnant.  If you drink alcohol: ? Limit how much you use to:  0-1 drink a day for women.  0-2 drinks a day for men. ? Be aware of how much alcohol is in your drink. In the [place] one drink equals one 12 oz bottle of beer (355 mL), one 5 oz glass of wine (148 mL), or one 1 oz glass of hard liquor (44 mL). Lifestyle   Work with your health care provider to maintain a healthy body weight or to lose weight. Ask what an ideal weight is for you.  Get at least 30 minutes of exercise most days of the week. Activities may  include walking, swimming, or biking.  Include exercise to strengthen your muscles (resistance exercise), such as Pilates or lifting weights, as part of your weekly exercise routine. Try to do these types of exercises for 30 minutes at least 3 days a week.  Do not use any products that contain nicotine or tobacco, such as cigarettes, e-cigarettes, and chewing tobacco. If you need help quitting, ask your health care provider.  Monitor your blood pressure at home as told by your health care provider.  Keep all follow-up visits as told by your health care provider. This is important. Medicines  Take over-the-counter and prescription medicines only as told by your health care provider. Follow directions carefully. Blood pressure medicines must be taken as prescribed.  Do not skip doses of blood pressure medicine. Doing this puts you at risk for problems and can make the medicine less effective.  Ask your health care provider about side effects or reactions to medicines that you should watch for. Contact a health care provider if you:  Think you are having a reaction to a medicine you are taking.  Have headaches that keep coming back (recurring).  Feel dizzy.  Have swelling in your ankles.  Have trouble with your vision. Get help right away if you:  Develop a severe headache or confusion.  Have unusual weakness or numbness.  Feel faint.  Have severe pain in your chest or abdomen.  Vomit repeatedly.  Have trouble breathing. Summary  Hypertension is when the force of blood pumping through your arteries is too strong. If this condition is not controlled, it may put you at risk for serious complications.  Your personal target blood pressure may vary depending on your medical conditions, your age, and other factors. For most people, a normal blood pressure is less than 120/80.  Hypertension is treated with lifestyle changes, medicines, or a combination of both. Lifestyle changes  include losing weight, eating a healthy, low-sodium diet, exercising more, and limiting alcohol. This information is not intended to replace advice given to you by your health care provider. Make sure you discuss any questions you have with your health care provider. Document Revised: 06/16/2018 Document Reviewed: 06/16/2018 Elsevier Patient Education  2020 Elsevier Inc.       Agustina Caroli, MD [facility]

## 2020-05-31 NOTE — Patient Instructions (Signed)
Description   Take 1.5 tablets today, then start taking 1 tablet everyday except 1/2 tablet on Mondays and Fridays.  Recheck INR in 2 weeks. Call with any new medications of if scheduled for any procedures [phone]

## 2020-05-31 NOTE — Assessment & Plan Note (Signed)
Uncontrolled hypertension.  Noncompliant with medication.  Stop lisinopril and start losartan 100 mg daily.  Continue all other medications. Blood sugar readings at home between 120 and 200.  Patient has been taking insulin only.  Not taking Ozempic as recommended.  Advised to start Ozempic and follow-up with endocrinologist as scheduled. Diet and nutrition discussed. Follow-up in 6 months.

## 2020-06-05 ENCOUNTER — Telehealth: Payer: Self-pay | Admitting: Emergency Medicine

## 2020-06-05 NOTE — Telephone Encounter (Signed)
What shot is she talking about?  She is on both insulin and Ozempic.  Please be more specific.  Thanks.

## 2020-06-05 NOTE — Telephone Encounter (Signed)
Please instruct on what I need to tell the patient on taking a shot with these blood sugar numbers

## 2020-06-05 NOTE — Telephone Encounter (Signed)
Pt calling regarding sugar level question   Pt is concerned she took Blood sugar level at 11:30 and it was 88 ,too  low to give herself a shot. She ate lunche   She took again at 12:43 it was a 207.  And now 1:20 it is 184   Does she need to take shot     Please advise

## 2020-06-06 ENCOUNTER — Telehealth: Payer: Self-pay | Admitting: Emergency Medicine

## 2020-06-06 NOTE — Telephone Encounter (Signed)
That is the purpose for using a sliding scale. The scale tells you how much insulin to use depending on the glucose number. I hope she knows this. Thanks.

## 2020-06-06 NOTE — Telephone Encounter (Signed)
Just spoke with patient. Patient stated she take the Ozempic once a week. Was unclear on how she is supposed to be taking her Humolog. Looking at the old rx direction she supposed to be taking Inject 0-56 Units into the skin 3 (three) times daily as needed (high blood sugar). Sliding Scale  Patient question was at 11:30 it was 88 so it was too low to give herself an injection then took again at 12:43 and it was 207 at 1:20 it was 184. So she wanted to know how high it need to be before she give herself an injection? Patient is scheduled to see Endo on 06/13/20 but need to know what she need to do until she gets in to see them?

## 2020-06-06 NOTE — Telephone Encounter (Signed)
Pt took medication this morning at 8 am and her bp is 186/84. She has recorded the following readings:  177/78 174/87 187/83 164/66  Pt is experiencing headaches and is concerned. She would like a call back to discuss how to better control her bp and sugar levels. Call was transferred to [facility]

## 2020-06-07 ENCOUNTER — Encounter (HOSPITAL_COMMUNITY): Payer: Self-pay

## 2020-06-07 ENCOUNTER — Ambulatory Visit: Payer: Self-pay

## 2020-06-07 ENCOUNTER — Other Ambulatory Visit: Payer: Self-pay

## 2020-06-07 ENCOUNTER — Ambulatory Visit (HOSPITAL_COMMUNITY)
Admission: EM | Admit: 2020-06-07 | Discharge: 2020-06-07 | Disposition: A | Discharge status: Home Health | Payer: Medicare Other | Attending: Family Medicine | Admitting: Family Medicine

## 2020-06-07 DIAGNOSIS — R519 Headache, unspecified: Secondary | ICD-10-CM | POA: Diagnosis not present

## 2020-06-07 DIAGNOSIS — I10 Essential (primary) hypertension: Secondary | ICD-10-CM

## 2020-06-07 MED ORDER — CLONIDINE HCL 0.1 MG PO TABS
0.1000 mg | ORAL_TABLET | Freq: Once | ORAL | Status: AC
  Administered 2020-06-07: 0.1 mg via ORAL

## 2020-06-07 MED ORDER — CLONIDINE HCL 0.1 MG PO TABS
ORAL_TABLET | ORAL | Status: AC
  Filled 2020-06-07: qty 1

## 2020-06-07 NOTE — ED Triage Notes (Addendum)
Pt reports her BP was 218/97 2 hrs ago. Pt states having headache for the past 5 days. Pt has not take any medication for the headache. Pt denies chest pain, shortness of breath, diarrhea, nausea, dizziness.

## 2020-06-07 NOTE — Telephone Encounter (Signed)
Patient called and says her blood pressure was up this morning at 0900 170/74 and at 1000 217/91. She says she's been having a headache x 5 days and called to the office yesterday and gave high BP readings. She says she doesn't have any other symptoms. I asked her to check her BP now and it was 213/83 at 1330. She says she usually takes her medications in the morning, but she felt bad so took them at 1230. I advised to go to the ED, she says the last time she was there 9 hours and really doesn't want to do that. I called the office and spoke to [place], [place]. She offered an appointment for today at 1600 with Dr. Linna Darner, if she agrees. I advised the patient and she says she will be in at 1600. Appointment scheduled by Solmon Ice, [place]. Care advice given, patient verbalized understanding.  Reason for Disposition . [4] Systolic BP  >= 801 OR Diastolic >= 655  AND [3] having NO cardiac or neurologic symptoms  Answer Assessment - Initial Assessment Questions 1. BLOOD PRESSURE: "What is the blood pressure?" "Did you take at least two measurements 5 minutes apart?"     213/83 2. ONSET: "When did you take your blood pressure?"     Now 3. HOW: "How did you obtain the blood pressure?" (e.g., visiting nurse, automatic home BP monitor)     Automatic home BP monitor 4. HISTORY: "Do you have a history of high blood pressure?"     Yes 5. MEDICATIONS: "Are you taking any medications for blood pressure?" "Have you missed any doses recently?"     No, took medication around 1230 6. OTHER SYMPTOMS: "Do you have any symptoms?" (e.g., headache, chest pain, blurred vision, difficulty breathing, weakness)     Headache 7. PREGNANCY: "Is there any chance you are pregnant?" "When was your last menstrual period?"     No  Protocols used: BLOOD PRESSURE - HIGH-A-AH

## 2020-06-07 NOTE — Telephone Encounter (Signed)
Pt is calling back with new BP readings  Per Lattie Haw Triage nurse   213/83    Patient does not want to go to er so  put her on with Linna Darner for today

## 2020-06-07 NOTE — Discharge Instructions (Addendum)
Your blood pressure was noted to be elevated during your visit today. If you are currently taking medication for high blood pressure, please ensure you are taking this as directed. If you do not have a history of high blood pressure and your blood pressure remains persistently elevated, you may need to begin taking a medication at some point. You may return here within the next few days to recheck if unable to see your primary care provider or if do not have a one.  BP (!) 195/63 (BP Location: Right Wrist)   Pulse 71   Temp 98.2 F (36.8 C) (Oral)   Resp 20   SpO2 96%

## 2020-06-07 NOTE — Telephone Encounter (Signed)
Patient was informed of the sliding scale and patient was also told to continue to monitor her BP and take med for headache if readings are over higher then 190/110 seek medical attention right away. Give [place] a call any more concerns

## 2020-06-08 ENCOUNTER — Ambulatory Visit (INDEPENDENT_AMBULATORY_CARE_PROVIDER_SITE_OTHER): Payer: Medicare Other | Admitting: Family Medicine

## 2020-06-08 ENCOUNTER — Encounter: Payer: Self-pay | Admitting: Family Medicine

## 2020-06-08 VITALS — BP 169/73 | HR 60 | Temp 98.0°F | Ht 61.0 in | Wt 257.0 lb

## 2020-06-08 DIAGNOSIS — I10 Essential (primary) hypertension: Secondary | ICD-10-CM

## 2020-06-08 DIAGNOSIS — E1159 Type 2 diabetes mellitus with other circulatory complications: Secondary | ICD-10-CM | POA: Diagnosis not present

## 2020-06-08 DIAGNOSIS — I152 Hypertension secondary to endocrine disorders: Secondary | ICD-10-CM

## 2020-06-08 MED ORDER — CLONIDINE HCL 0.3 MG PO TABS: 0.3000 mg | ORAL_TABLET | Freq: Two times a day (BID) | ORAL | 3 refills | Status: DC

## 2020-06-08 NOTE — Progress Notes (Signed)
Patient ID: [name], female    DOB: 18-Jun-1941  Age: 79 y.o. MRN: [id number]  Chief Complaint  Patient presents with  . Hypertension    headache x 1 week / recent med change from lisinopril to losartan     Subjective:   Patient is here for a recheck with regard to her blood pressure.  Apparently she has had some headaches since switching from lisinopril to losartan.  She takes her blood pressure down around her elbow because of the obese upper arms.  She has been getting high readings.  She has had some headaches but otherwise doing okay.  She said somebody called her to change her sliding scale her blood sugar.  She does not know who called her.  The sliding scale is very different than what she was been on for a long time and it gives her much much less insulin.  She takes her medicines faithfully and monitors her sugars obsessively.  Current allergies, medications, problem list, past/family and social histories reviewed.  Objective:  BP (!) 169/73   Pulse 60   Temp 98 F (36.7 C)   Ht 5\' 1"  (1.549 m)   Wt 257 lb (116.6 kg)   SpO2 96%   BMI 48.56 kg/m   Pleasant alert pear-shaped lady in no acute distress.  Chest clear.  Heart regular without murmur.  Vitals are as noted.  I reviewed the last several weeks of her blood sugars. Assessment & Plan:   Assessment: 1. Hypertension associated with diabetes ([place])   2. Morbid obesity ([place])       Plan: Long discussion.  I am going to increase her clonidine 2.3 twice daily.  She says she also is taking a diuretic for blood pressure but I do not see it in the list of medicines.  She is to return in a couple weeks and get her blood pressure remonitored.  I think looking back at her prior hemoglobin A1c is at her sugars that it is fine for her to continue using the old sliding scale which is done its job well.  No orders of the defined types were placed in this encounter.   Meds ordered this encounter  Medications  . cloNIDine  (CATAPRES) 0.3 MG tablet    Sig: Take 1 tablet (0.3 mg total) by mouth 2 (two) times daily.    Dispense:  60 tablet    Refill:  3         Patient Instructions  Continue taking your losartan 100 mg daily  Increase the clonidine 0.2 mg to taking 1 in the morning and 1-1/2 (0.3 mg) in the evening for the next couple of days, then on Monday increase to 1-1/2 (0.3 mg) morning and evening.  Return to see Dr. Mitchel Honour in about 2 weeks, sooner if problems arise.  I do not know who changed your sliding scale, but in reviewing your blood sugars I think you are doing well on your sliding scale and I would not make a change at this time but just stick with what you were doing before.  Your previous hemoglobin A1c of 7.8 or 7.9 has been good.    Return in about 2 weeks (around 06/22/2020), or Dr. Mitchel Honour, for Blood pressure follow-up.   Ruben Reason, MD 06/08/2020

## 2020-06-08 NOTE — Patient Instructions (Signed)
Continue taking your losartan 100 mg daily  Increase the clonidine 0.2 mg to taking 1 in the morning and 1-1/2 (0.3 mg) in the evening for the next couple of days, then on Monday increase to 1-1/2 (0.3 mg) morning and evening.  Return to see Dr. Mitchel Honour in about 2 weeks, sooner if problems arise.  I do not know who changed your sliding scale, but in reviewing your blood sugars I think you are doing well on your sliding scale and I would not make a change at this time but just stick with what you were doing before.  Your previous hemoglobin A1c of 7.8 or 7.9 has been good.

## 2020-06-11 ENCOUNTER — Ambulatory Visit: Payer: Self-pay

## 2020-06-11 ENCOUNTER — Other Ambulatory Visit: Payer: Self-pay

## 2020-06-11 ENCOUNTER — Encounter: Payer: Self-pay | Admitting: Emergency Medicine

## 2020-06-11 ENCOUNTER — Other Ambulatory Visit: Payer: Self-pay | Admitting: Cardiology

## 2020-06-11 ENCOUNTER — Ambulatory Visit (INDEPENDENT_AMBULATORY_CARE_PROVIDER_SITE_OTHER): Payer: Medicare Other | Admitting: Emergency Medicine

## 2020-06-11 VITALS — BP 180/58 | HR 75 | Temp 98.2°F | Resp 18 | Ht 61.0 in | Wt 259.0 lb

## 2020-06-11 DIAGNOSIS — I10 Essential (primary) hypertension: Secondary | ICD-10-CM | POA: Diagnosis not present

## 2020-06-11 DIAGNOSIS — G4733 Obstructive sleep apnea (adult) (pediatric): Secondary | ICD-10-CM | POA: Diagnosis not present

## 2020-06-11 DIAGNOSIS — I152 Hypertension secondary to endocrine disorders: Secondary | ICD-10-CM

## 2020-06-11 DIAGNOSIS — Z9989 Dependence on other enabling machines and devices: Secondary | ICD-10-CM

## 2020-06-11 DIAGNOSIS — I480 Paroxysmal atrial fibrillation: Secondary | ICD-10-CM | POA: Diagnosis not present

## 2020-06-11 DIAGNOSIS — I5032 Chronic diastolic (congestive) heart failure: Secondary | ICD-10-CM

## 2020-06-11 DIAGNOSIS — I700 Atherosclerosis of aorta: Secondary | ICD-10-CM

## 2020-06-11 DIAGNOSIS — Z6841 Body Mass Index (BMI) 40.0 and over, adult: Secondary | ICD-10-CM

## 2020-06-11 DIAGNOSIS — E1159 Type 2 diabetes mellitus with other circulatory complications: Secondary | ICD-10-CM

## 2020-06-11 MED ORDER — AMLODIPINE BESYLATE 5 MG PO TABS: 5.0000 mg | ORAL_TABLET | Freq: Every day | ORAL | 3 refills | Status: DC

## 2020-06-11 NOTE — Patient Instructions (Addendum)
Start clonidine 0.3 mg twice a day (prescription sent by Dr. Jodi Mourning last Friday). Start amlodipine 5 mg daily. Continue losartan, amiodarone, and torsemide. Continue monitoring blood pressure readings at home. Follow-up with endocrinologist as scheduled tomorrow.  Hypertension, Adult High blood pressure (hypertension) is when the force of blood pumping through the arteries is too strong. The arteries are the blood vessels that carry blood from the heart throughout the body. Hypertension forces the heart to work harder to pump blood and may cause arteries to become narrow or stiff. Untreated or uncontrolled hypertension can cause a heart attack, heart failure, a stroke, kidney disease, and other problems. A blood pressure reading consists of a higher number over a lower number. Ideally, your blood pressure should be below 120/80. The first ("top") number is called the systolic pressure. It is a measure of the pressure in your arteries as your heart beats. The second ("bottom") number is called the diastolic pressure. It is a measure of the pressure in your arteries as the heart relaxes. What are the causes? The exact cause of this condition is not known. There are some conditions that result in or are related to high blood pressure. What increases the risk? Some risk factors for high blood pressure are under your control. The following factors may make you more likely to develop this condition:  Smoking.  Having type 2 diabetes mellitus, high cholesterol, or both.  Not getting enough exercise or physical activity.  Being overweight.  Having too much fat, sugar, calories, or salt (sodium) in your diet.  Drinking too much alcohol. Some risk factors for high blood pressure may be difficult or impossible to change. Some of these factors include:  Having chronic kidney disease.  Having a family history of high blood pressure.  Age. Risk increases with age.  Race. You may be at higher risk  if you are African American.  Gender. Men are at higher risk than women before age 99. After age 15, women are at higher risk than men.  Having obstructive sleep apnea.  Stress. What are the signs or symptoms? High blood pressure may not cause symptoms. Very high blood pressure (hypertensive crisis) may cause:  Headache.  Anxiety.  Shortness of breath.  Nosebleed.  Nausea and vomiting.  Vision changes.  Severe chest pain.  Seizures. How is this diagnosed? This condition is diagnosed by measuring your blood pressure while you are seated, with your arm resting on a flat surface, your legs uncrossed, and your feet flat on the floor. The cuff of the blood pressure monitor will be placed directly against the skin of your upper arm at the level of your heart. It should be measured at least twice using the same arm. Certain conditions can cause a difference in blood pressure between your right and left arms. Certain factors can cause blood pressure readings to be lower or higher than normal for a short period of time:  When your blood pressure is higher when you are in a health care provider's office than when you are at home, this is called white coat hypertension. Most people with this condition do not need medicines.  When your blood pressure is higher at home than when you are in a health care provider's office, this is called masked hypertension. Most people with this condition may need medicines to control blood pressure. If you have a high blood pressure reading during one visit or you have normal blood pressure with other risk factors, you may be asked  to:  Return on a different day to have your blood pressure checked again.  Monitor your blood pressure at home for 1 week or longer. If you are diagnosed with hypertension, you may have other blood or imaging tests to help your health care provider understand your overall risk for other conditions. How is this treated? This  condition is treated by making healthy lifestyle changes, such as eating healthy foods, exercising more, and reducing your alcohol intake. Your health care provider may prescribe medicine if lifestyle changes are not enough to get your blood pressure under control, and if:  Your systolic blood pressure is above 130.  Your diastolic blood pressure is above 80. Your personal target blood pressure may vary depending on your medical conditions, your age, and other factors. Follow these instructions at home: Eating and drinking   Eat a diet that is high in fiber and potassium, and low in sodium, added sugar, and fat. An example eating plan is called the DASH (Dietary Approaches to Stop Hypertension) diet. To eat this way: ? Eat plenty of fresh fruits and vegetables. Try to fill one half of your plate at each meal with fruits and vegetables. ? Eat whole grains, such as whole-wheat pasta, brown rice, or whole-grain bread. Fill about one fourth of your plate with whole grains. ? Eat or drink low-fat dairy products, such as skim milk or low-fat yogurt. ? Avoid fatty cuts of meat, processed or cured meats, and poultry with skin. Fill about one fourth of your plate with lean proteins, such as fish, chicken without skin, beans, eggs, or tofu. ? Avoid pre-made and processed foods. These tend to be higher in sodium, added sugar, and fat.  Reduce your daily sodium intake. Most people with hypertension should eat less than 1,500 mg of sodium a day.  Do not drink alcohol if: ? Your health care provider tells you not to drink. ? You are pregnant, may be pregnant, or are planning to become pregnant.  If you drink alcohol: ? Limit how much you use to:  0-1 drink a day for women.  0-2 drinks a day for men. ? Be aware of how much alcohol is in your drink. In the [place] one drink equals one 12 oz bottle of beer (355 mL), one 5 oz glass of wine (148 mL), or one 1 oz glass of hard liquor (44  mL). Lifestyle   Work with your health care provider to maintain a healthy body weight or to lose weight. Ask what an ideal weight is for you.  Get at least 30 minutes of exercise most days of the week. Activities may include walking, swimming, or biking.  Include exercise to strengthen your muscles (resistance exercise), such as Pilates or lifting weights, as part of your weekly exercise routine. Try to do these types of exercises for 30 minutes at least 3 days a week.  Do not use any products that contain nicotine or tobacco, such as cigarettes, e-cigarettes, and chewing tobacco. If you need help quitting, ask your health care provider.  Monitor your blood pressure at home as told by your health care provider.  Keep all follow-up visits as told by your health care provider. This is important. Medicines  Take over-the-counter and prescription medicines only as told by your health care provider. Follow directions carefully. Blood pressure medicines must be taken as prescribed.  Do not skip doses of blood pressure medicine. Doing this puts you at risk for problems and can make the medicine  less effective.  Ask your health care provider about side effects or reactions to medicines that you should watch for. Contact a health care provider if you:  Think you are having a reaction to a medicine you are taking.  Have headaches that keep coming back (recurring).  Feel dizzy.  Have swelling in your ankles.  Have trouble with your vision. Get help right away if you:  Develop a severe headache or confusion.  Have unusual weakness or numbness.  Feel faint.  Have severe pain in your chest or abdomen.  Vomit repeatedly.  Have trouble breathing. Summary  Hypertension is when the force of blood pumping through your arteries is too strong. If this condition is not controlled, it may put you at risk for serious complications.  Your personal target blood pressure may vary depending on  your medical conditions, your age, and other factors. For most people, a normal blood pressure is less than 120/80.  Hypertension is treated with lifestyle changes, medicines, or a combination of both. Lifestyle changes include losing weight, eating a healthy, low-sodium diet, exercising more, and limiting alcohol. This information is not intended to replace advice given to you by your health care provider. Make sure you discuss any questions you have with your health care provider. Document Revised: 06/16/2018 Document Reviewed: 06/16/2018 Elsevier Patient Education  [employer].     If you have lab work done today you will be contacted with your lab results within the next 2 weeks.  If you have not heard from [place] then please contact us. The fastest way to get your results is to register for My Chart.   IF you received an x-ray today, you will receive an invoice from [facility]. Please contact [facility] at [phone] with questions or concerns regarding your invoice.   IF you received labwork today, you will receive an invoice from [facility]. Please contact [facility] at [phone] with questions or concerns regarding your invoice.   Our billing staff will not be able to assist you with questions regarding bills from these companies.  You will be contacted with the lab results as soon as they are available. The fastest way to get your results is to activate your My Chart account. Instructions are located on the last page of this paperwork. If you have not heard from [place] regarding the results in 2 weeks, please contact this office.

## 2020-06-11 NOTE — Progress Notes (Signed)
[name] 79 y.o.   Chief Complaint  Patient presents with  . Hypertension    blood pressure continues to be elevated  . Headache    per patient since Thursday    HISTORY OF PRESENT ILLNESS: This is a 79 y.o. female complaining of uncontrolled hypertension for the past several days associated with intermittent headaches.  Was seen here last Friday by Dr. Linna Darner and started on clonidine 0.3 mg twice a day.  Also on losartan 100 mg daily, torsemide 40 mg daily, and amiodarone 200 mg daily. Also has a history of diabetes, insulin-dependent, scheduled to see endocrinologist tomorrow. On long-term anticoagulation therapy with Coumadin. Has history of congestive heart failure and paroxysmal atrial fibrillation.   HPI   Prior to Admission medications   Medication Sig Start Date End Date Taking? Authorizing Provider  allopurinol (ZYLOPRIM) 300 MG tablet Take 300 mg by mouth daily.  07/07/13  Yes [provider]  amiodarone (PACERONE) 200 MG tablet TAKE 1 TABLET BY MOUTH EVERY DAY. [place] ONLY 03/16/20  Yes [name], MD  cloNIDine (CATAPRES) 0.3 MG tablet Take 1 tablet (0.3 mg total) by mouth 2 (two) times daily. 06/08/20  Yes Posey Boyer, MD  diltiazem (CARDIZEM) 30 MG tablet Take one tablet every 4 hours as needed for heart rate greater than 100 and blood pressure needs to be above 100 05/09/20  Yes Sherran Needs, NP  Insulin Lispro Prot & Lispro (HUMALOG 75/25 MIX) (75-25) 100 UNIT/ML Kwikpen Inject 0-56 Units into the skin 3 (three) times daily as needed (high blood sugar). Sliding Scale 08/12/16  Yes [provider]  levothyroxine (SYNTHROID, LEVOTHROID) 75 MCG tablet Take 75 mcg daily before breakfast by mouth.  12/29/16  Yes [provider]  loperamide (IMODIUM) 2 MG capsule Take 1 capsule (2 mg total) by mouth 4 (four) times daily as needed for diarrhea or loose stools. 03/20/20  Yes Garald Balding, PA-C  losartan (COZAAR) 100 MG  tablet Take 1 tablet (100 mg total) by mouth daily. 05/31/20  Yes Miciah Shealy, Ines Bloomer, MD  potassium chloride (KLOR-CON) 10 MEQ tablet Take 2 tablets (20 mEq total) by mouth daily. 10/18/19  Yes [name], MD  RESTASIS MULTIDOSE 0.05 % ophthalmic emulsion Place 1 drop into both eyes 2 (two) times daily.  03/04/20  Yes [provider]  rosuvastatin (CRESTOR) 10 MG tablet Take 10 mg by mouth every evening.   Yes [provider]  torsemide (DEMADEX) 20 MG tablet Take 2 tablets (40 mg total) by mouth daily. 09/29/19  Yes [name], MD  warfarin (COUMADIN) 5 MG tablet Take 1/2 a tablet to 1 tablet by mouth daily as directed by the coumadin clinic. 06/11/20  Yes Jerline Pain, MD  Accu-Chek Softclix Lancets lancets 1 each by Other route 3 (three) times daily. as directed 03/30/20   Maximiano Coss, NP  blood glucose meter kit and supplies Dispense based on patient and insurance preference. Use up to four times daily as directed. (FOR ICD-10 E10.9, E11.9). 05/03/20   Horald Pollen, MD  Continuous Blood Gluc Receiver (FREESTYLE LIBRE 14 DAY READER) DEVI Use as directed. 04/18/20   Horald Pollen, MD  NOVOFINE PLUS 32G X 4 MM MISC 1 each by Subdermal route 2 (two) times daily. as directed 03/30/20   Maximiano Coss, NP  Semaglutide,0.25 or 0.5MG /DOS, (OZEMPIC, 0.25 OR 0.5 MG/DOSE,) 2 MG/1.5ML SOPN Inject 0.75 mLs (1 mg total) into the skin once a week.  Patient not taking: Reported on 06/11/2020 04/18/20   Horald Pollen, MD    Allergies  Allergen Reactions  . Metformin Diarrhea    Patient Active Problem List   Diagnosis Date Noted  . Aortic atherosclerosis ([place]) 03/28/2020  . Obesity 12/19/2015  . Hypersomnia 12/19/2015  . Acute diastolic congestive heart failure, NYHA class 3 ([place]) 10/03/2013  . Prolonged QT interval 10/03/2013  . Acute diastolic heart failure ([place]) 10/03/2013  . Long term (current) use of anticoagulants 06/09/2012  . BENIGN NEOPLASM  OF ADRENAL GLAND 11/25/2010  . Chronic diastolic heart failure ([place]) 02/20/2010  . DM 05/16/2009  . GOUT 05/16/2009  . OBESITY, MORBID 05/16/2009  . Hypertension associated with diabetes ([place]) 05/16/2009  . Paroxysmal atrial fibrillation ([place]) 05/16/2009  . HYPERLIPIDEMIA 11/30/2008  . Obstructive sleep apnea on CPAP 11/30/2008    Past Medical History:  Diagnosis Date  . Arthritis   . Back pain   . Chronic anticoagulation    due to aflutter  . Chronic kidney disease   . Diabetes mellitus   . Diastolic CHF, chronic ([place])    a.  echo 2006 - ef 55-65%; mild diast dysfxn;    b. Echo 08/2011: Mild LVH, EF 60%;  c. 04/2013 Echo: EF 65-69%, mild conc LVH;  08/2014 Echo: EF 60-65%, mild-mod MR.  . Gout   . Hyperlipidemia   . Hypertension    a.  Renal arterial Dopplers 12/2011: 1-59% right renal artery stenosis  . Morbid obesity ([place])   . Obstructive sleep apnea on CPAP   . Paroxysmal Afib/Flutter    a. 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 APPENDECTOMY    . ATRIAL FLUTTER ABLATION N/A 09/24/2011   Procedure: ATRIAL FLUTTER ABLATION;  Surgeon: [name], MD;  Location: [facility] CATH LAB;  Service: Cardiovascular;  Laterality: N/A;  . CARDIOVERSION  10/22/2011   Procedure: CARDIOVERSION;  Surgeon: [name], MD;  Location: [facility];  Service: Cardiovascular;  Laterality: N/A;  . CARDIOVERSION N/A 09/10/2011   Procedure: CARDIOVERSION;  Surgeon: [name], MD;  Location: [facility] CATH LAB;  Service: Cardiovascular;  Laterality: N/A;  . CHOLECYSTECTOMY    . TONSILLECTOMY  1982  . TOTAL ABDOMINAL HYSTERECTOMY      Social History   Socioeconomic History  . Marital status: Married    Spouse name: Not on file  . Number of children: 3  . Years of education: Not on file  . Highest education level: Not on file  Occupational History  . Occupation: DISABILITY/housewife    Employer: RETIRED  Tobacco Use  . Smoking status: Never  Smoker  . Smokeless tobacco: Never Used  Vaping Use  . Vaping Use: Never used  Substance and Sexual Activity  . Alcohol use: No  . Drug use: No  . Sexual activity: Yes  Other Topics Concern  . Not on file  Social History Narrative  . Not on file   Social Determinants of Health   Financial Resource Strain:   . Difficulty of Paying Living Expenses: Not on file  Food Insecurity:   . Worried About Charity fundraiser in the Last Year: Not on file  . Ran Out of Food in the Last Year: Not on file  Transportation Needs:   . Lack of Transportation (Medical): Not on file  . Lack of Transportation (Non-Medical): Not on file  Physical Activity:   . Days of Exercise per Week: Not on file  .  Minutes of Exercise per Session: Not on file  Stress:   . Feeling of Stress : Not on file  Social Connections:   . Frequency of Communication with Friends and Family: Not on file  . Frequency of Social Gatherings with Friends and Family: Not on file  . Attends Religious Services: Not on file  . Active Member of Clubs or Organizations: Not on file  . Attends Archivist Meetings: Not on file  . Marital Status: Not on file  Intimate Partner Violence:   . Fear of Current or Ex-Partner: Not on file  . Emotionally Abused: Not on file  . Physically Abused: Not on file  . Sexually Abused: Not on file    Family History  Problem Relation Age of Onset  . Heart disease Father   . Hypertension Father   . Breast cancer Sister   . Cancer Sister        breast     Review of Systems  Constitutional: Negative.  Negative for chills and fever.  HENT: Negative.  Negative for congestion and sore throat.   Respiratory: Negative.  Negative for cough and shortness of breath.   Cardiovascular: Negative.  Negative for chest pain and palpitations.  Gastrointestinal: Negative.  Negative for abdominal pain, diarrhea, heartburn, nausea and vomiting.  Genitourinary: Negative.  Negative for dysuria and  hematuria.  Musculoskeletal: Negative for back pain, myalgias and neck pain.  Skin: Negative.  Negative for rash.  Neurological: Positive for headaches. Negative for dizziness, sensory change and focal weakness.  All other systems reviewed and are negative.   Today's Vitals   06/11/20 1045  BP: (!) 215/42  Pulse: 75  Resp: 16  Temp: 98.2 F (36.8 C)  TempSrc: Temporal  SpO2: 94%  Weight: 259 lb (117.5 kg)  Height: _0  (1.549 m)   Body mass index is 48.94 kg/m.  Physical Exam Vitals reviewed.  Constitutional:      Appearance: She is obese.  HENT:     Head: Normocephalic.  Eyes:     Extraocular Movements: Extraocular movements intact.     Pupils: Pupils are equal, round, and reactive to light.  Cardiovascular:     Rate and Rhythm: Normal rate and regular rhythm.     Pulses: Normal pulses.     Heart sounds: Normal heart sounds.  Pulmonary:     Effort: Pulmonary effort is normal.     Breath sounds: Normal breath sounds.  Musculoskeletal:        General: Normal range of motion.     Cervical back: Normal range of motion and neck supple.     Right lower leg: No edema.     Left lower leg: No edema.  Skin:    General: Skin is warm and dry.     Capillary Refill: Capillary refill takes less than 2 seconds.  Neurological:     General: No focal deficit present.     Mental Status: She is alert and oriented to person, place, and time.     Sensory: No sensory deficit.     Motor: No weakness.     Gait: Gait normal.  Psychiatric:        Mood and Affect: Mood normal.        Behavior: Behavior normal.      ASSESSMENT & PLAN: Uncontrolled hypertension Persistently elevated blood pressure.  Will add amlodipine 5 mg daily and continue losartan, torsemide, and amiodarone.  Increase clonidine to 0.3 mg twice daily. Follow-up in 4 weeks.  Kassie was seen today for hypertension and headache.  Diagnoses and all orders for this visit:  Uncontrolled hypertension -      amLODipine (NORVASC) 5 MG tablet; Take 1 tablet (5 mg total) by mouth daily.  Hypertension associated with diabetes ([place])  Morbid obesity (H[facility])  Obstructive sleep apnea on CPAP  Paroxysmal atrial fibrillation (H[facility])  Chronic diastolic heart failure (H[facility])  Aortic atherosclerosis (H[facility])  Body mass index (BMI) of 45.0-49.9 in adult [facility])    Patient Instructions   Start clonidine 0.3 mg twice a day (prescription sent by Dr. Jodi Mourning last Friday). Start amlodipine 5 mg daily. Continue losartan, amiodarone, and torsemide. Continue monitoring blood pressure readings at home. Follow-up with endocrinologist as scheduled tomorrow.  Hypertension, Adult High blood pressure (hypertension) is when the force of blood pumping through the arteries is too strong. The arteries are the blood vessels that carry blood from the heart throughout the body. Hypertension forces the heart to work harder to pump blood and may cause arteries to become narrow or stiff. Untreated or uncontrolled hypertension can cause a heart attack, heart failure, a stroke, kidney disease, and other problems. A blood pressure reading consists of a higher number over a lower number. Ideally, your blood pressure should be below 120/80. The first ("top") number is called the systolic pressure. It is a measure of the pressure in your arteries as your heart beats. The second ("bottom") number is called the diastolic pressure. It is a measure of the pressure in your arteries as the heart relaxes. What are the causes? The exact cause of this condition is not known. There are some conditions that result in or are related to high blood pressure. What increases the risk? Some risk factors for high blood pressure are under your control. The following factors may make you more likely to develop this condition:  Smoking.  Having type 2 diabetes mellitus, high cholesterol, or both.  Not getting enough exercise or physical activity.  Being  overweight.  Having too much fat, sugar, calories, or salt (sodium) in your diet.  Drinking too much alcohol. Some risk factors for high blood pressure may be difficult or impossible to change. Some of these factors include:  Having chronic kidney disease.  Having a family history of high blood pressure.  Age. Risk increases with age.  Race. You may be at higher risk if you are African American.  Gender. Men are at higher risk than women before age 12. After age 102, women are at higher risk than men.  Having obstructive sleep apnea.  Stress. What are the signs or symptoms? High blood pressure may not cause symptoms. Very high blood pressure (hypertensive crisis) may cause:  Headache.  Anxiety.  Shortness of breath.  Nosebleed.  Nausea and vomiting.  Vision changes.  Severe chest pain.  Seizures. How is this diagnosed? This condition is diagnosed by measuring your blood pressure while you are seated, with your arm resting on a flat surface, your legs uncrossed, and your feet flat on the floor. The cuff of the blood pressure monitor will be placed directly against the skin of your upper arm at the level of your heart. It should be measured at least twice using the same arm. Certain conditions can cause a difference in blood pressure between your right and left arms. Certain factors can cause blood pressure readings to be lower or higher than normal for a short period of time:  When your blood pressure is higher when you are in  a health care provider's office than when you are at home, this is called white coat hypertension. Most people with this condition do not need medicines.  When your blood pressure is higher at home than when you are in a health care provider's office, this is called masked hypertension. Most people with this condition may need medicines to control blood pressure. If you have a high blood pressure reading during one visit or you have normal blood  pressure with other risk factors, you may be asked to:  Return on a different day to have your blood pressure checked again.  Monitor your blood pressure at home for 1 week or longer. If you are diagnosed with hypertension, you may have other blood or imaging tests to help your health care provider understand your overall risk for other conditions. How is this treated? This condition is treated by making healthy lifestyle changes, such as eating healthy foods, exercising more, and reducing your alcohol intake. Your health care provider may prescribe medicine if lifestyle changes are not enough to get your blood pressure under control, and if:  Your systolic blood pressure is above 130.  Your diastolic blood pressure is above 80. Your personal target blood pressure may vary depending on your medical conditions, your age, and other factors. Follow these instructions at home: Eating and drinking   Eat a diet that is high in fiber and potassium, and low in sodium, added sugar, and fat. An example eating plan is called the DASH (Dietary Approaches to Stop Hypertension) diet. To eat this way: ? Eat plenty of fresh fruits and vegetables. Try to fill one half of your plate at each meal with fruits and vegetables. ? Eat whole grains, such as whole-wheat pasta, brown rice, or whole-grain bread. Fill about one fourth of your plate with whole grains. ? Eat or drink low-fat dairy products, such as skim milk or low-fat yogurt. ? Avoid fatty cuts of meat, processed or cured meats, and poultry with skin. Fill about one fourth of your plate with lean proteins, such as fish, chicken without skin, beans, eggs, or tofu. ? Avoid pre-made and processed foods. These tend to be higher in sodium, added sugar, and fat.  Reduce your daily sodium intake. Most people with hypertension should eat less than 1,500 mg of sodium a day.  Do not drink alcohol if: ? Your health care provider tells you not to drink. ? You are  pregnant, may be pregnant, or are planning to become pregnant.  If you drink alcohol: ? Limit how much you use to:  0-1 drink a day for women.  0-2 drinks a day for men. ? Be aware of how much alcohol is in your drink. In the [place] one drink equals one 12 oz bottle of beer (355 mL), one 5 oz glass of wine (148 mL), or one 1 oz glass of hard liquor (44 mL). Lifestyle   Work with your health care provider to maintain a healthy body weight or to lose weight. Ask what an ideal weight is for you.  Get at least 30 minutes of exercise most days of the week. Activities may include walking, swimming, or biking.  Include exercise to strengthen your muscles (resistance exercise), such as Pilates or lifting weights, as part of your weekly exercise routine. Try to do these types of exercises for 30 minutes at least 3 days a week.  Do not use any products that contain nicotine or tobacco, such as cigarettes, e-cigarettes, and chewing tobacco.  If you need help quitting, ask your health care provider.  Monitor your blood pressure at home as told by your health care provider.  Keep all follow-up visits as told by your health care provider. This is important. Medicines  Take over-the-counter and prescription medicines only as told by your health care provider. Follow directions carefully. Blood pressure medicines must be taken as prescribed.  Do not skip doses of blood pressure medicine. Doing this puts you at risk for problems and can make the medicine less effective.  Ask your health care provider about side effects or reactions to medicines that you should watch for. Contact a health care provider if you:  Think you are having a reaction to a medicine you are taking.  Have headaches that keep coming back (recurring).  Feel dizzy.  Have swelling in your ankles.  Have trouble with your vision. Get help right away if you:  Develop a severe headache or confusion.  Have unusual weakness or  numbness.  Feel faint.  Have severe pain in your chest or abdomen.  Vomit repeatedly.  Have trouble breathing. Summary  Hypertension is when the force of blood pumping through your arteries is too strong. If this condition is not controlled, it may put you at risk for serious complications.  Your personal target blood pressure may vary depending on your medical conditions, your age, and other factors. For most people, a normal blood pressure is less than 120/80.  Hypertension is treated with lifestyle changes, medicines, or a combination of both. Lifestyle changes include losing weight, eating a healthy, low-sodium diet, exercising more, and limiting alcohol. This information is not intended to replace advice given to you by your health care provider. Make sure you discuss any questions you have with your health care provider. Document Revised: 06/16/2018 Document Reviewed: 06/16/2018 Elsevier Patient Education  [employer].     If you have lab work done today you will be contacted with your lab results within the next 2 weeks.  If you have not heard from [place] then please contact us. The fastest way to get your results is to register for My Chart.   IF you received an x-ray today, you will receive an invoice from [facility]. Please contact [facility] at [phone] with questions or concerns regarding your invoice.   IF you received labwork today, you will receive an invoice from [facility]. Please contact [facility] at [phone] with questions or concerns regarding your invoice.   Our billing staff will not be able to assist you with questions regarding bills from these companies.  You will be contacted with the lab results as soon as they are available. The fastest way to get your results is to activate your My Chart account. Instructions are located on the last page of this paperwork. If you have not heard from [place] regarding the results in 2 weeks, please  contact this office.         Agustina Caroli, MD [facility]

## 2020-06-11 NOTE — Assessment & Plan Note (Signed)
Persistently elevated blood pressure.  Will add amlodipine 5 mg daily and continue losartan, torsemide, and amiodarone.  Increase clonidine to 0.3 mg twice daily. Follow-up in 4 weeks.

## 2020-06-11 NOTE — Telephone Encounter (Signed)
  Pt. Reports she was seen 06/08/20 for elevated BP. Reports medication "was increased but my BP is still high and my headache is worse this morning." Warm transfer to Judson Roch in the practice. Answer Assessment - Initial Assessment Questions 1. BLOOD PRESSURE: "What is the blood pressure?" "Did you take at least two measurements 5 minutes apart?"     196/92 2. ONSET: "When did you take your blood pressure?"     30 minutes 3. HOW: "How did you obtain the blood pressure?" (e.g., visiting nurse, automatic home BP monitor)     Home monitor 4. HISTORY: "Do you have a history of high blood pressure?"     Yes 5. MEDICATIONS: "Are you taking any medications for blood pressure?" "Have you missed any doses recently?"     No missed dose 6. OTHER SYMPTOMS: "Do you have any symptoms?" (e.g., headache, chest pain, blurred vision, difficulty breathing, weakness)     Headache 7. PREGNANCY: "Is there any chance you are pregnant?" "When was your last menstrual period?"     No  Protocols used: BLOOD PRESSURE - HIGH-A-AH

## 2020-06-13 ENCOUNTER — Telehealth: Payer: Self-pay | Admitting: *Deleted

## 2020-06-13 NOTE — Telephone Encounter (Signed)
On 06/12/2020, faxed Rx request for Rosuvastatin 10 mg to [facility]. Confirmation page 5:16 pm.

## 2020-06-14 ENCOUNTER — Telehealth: Payer: Self-pay | Admitting: Pharmacist

## 2020-06-14 MED ORDER — APIXABAN 5 MG PO TABS: 5.0000 mg | ORAL_TABLET | Freq: Two times a day (BID) | ORAL | 1 refills | Status: DC

## 2020-06-14 NOTE — Telephone Encounter (Signed)
Called pt to r/s INR appt she missed today. Pt will come tomorrow 8/27 at 4pm for INR check. Discussed changing to [place] which pt is interested in. She has [employer] which covers Eliquis well. Pt is obese however Eliquis has good safety and efficacy data in patients with BMI in the 40s, especially since her weight is < 120kg.  Rx sent to pharmacy for Eliquis 5mg  BID. Pt aware to keep INR check tomorrow so that we can transition her to Eliquis.

## 2020-06-15 ENCOUNTER — Ambulatory Visit (INDEPENDENT_AMBULATORY_CARE_PROVIDER_SITE_OTHER): Payer: Medicare Other | Admitting: Pharmacist

## 2020-06-15 ENCOUNTER — Other Ambulatory Visit: Payer: Self-pay

## 2020-06-15 DIAGNOSIS — I480 Paroxysmal atrial fibrillation: Secondary | ICD-10-CM

## 2020-06-15 DIAGNOSIS — Z5181 Encounter for therapeutic drug level monitoring: Secondary | ICD-10-CM

## 2020-06-15 LAB — POCT INR: INR: 2 (ref 2.0–3.0)

## 2020-06-15 NOTE — Patient Instructions (Signed)
Do not take any more coumadin. Start Eliquis 5mg  twice a day tomorrow morning.

## 2020-06-18 NOTE — ED Provider Notes (Signed)
[place]   [id number] 06/07/20 Arrival Time: 1443  ASSESSMENT & PLAN:  1. Nonintractable episodic headache, unspecified headache type   2. Elevated blood pressure reading in office with diagnosis of hypertension     Meds ordered this encounter  Medications  . cloNIDine (CATAPRES) tablet 0.1 mg   No symptoms suggesting hypertensive urgency. She will continue home medications. Recheck BP 185/62 upon discharge.   Follow-up Information    [place].   Specialty: Emergency Medicine Why: If symptoms worsen in any way. Contact information: [address] [id number] [place] [phone]              Reviewed expectations re: course of current medical issues. Questions answered. Outlined signs and symptoms indicating need for more acute intervention. Patient verbalized understanding. After Visit Summary given.   SUBJECTIVE:  [name] is a 79 y.o. female who presents with concerns regarding increased blood pressures. Reports that she is treated for HTN. Reports BP 218/97 a few hours ago. Generalized intermittent headache over the past several days. VS in triage: BP (!) 195/63 (BP Location: Right Wrist)   Pulse 71   Temp 98.2 F (36.8 C) (Oral)   Resp 20   SpO2 96%   No current severe headache; reports has eased.  She reports taking medications as instructed, no chest pain on exertion, no dyspnea on exertion, no swelling of ankles, no orthostatic dizziness or lightheadedness, no orthopnea or paroxysmal nocturnal dyspnea and no palpitations.   Social History   Tobacco Use  Smoking Status Never Smoker  Smokeless Tobacco Never Used      OBJECTIVE:  Vitals:   06/07/20 1825  BP: (!) 195/63  Pulse: 71  Resp: 20  Temp: 98.2 F (36.8 C)  TempSrc: Oral  SpO2: 96%    General appearance: alert; no distress Eyes: PERRLA; EOMI HENT: normocephalic; atraumatic Neck:  supple Lungs: unlabored Heart: regular Abdomen: soft, non-tender; bowel sounds normal Extremities: no edema; symmetrical with no gross deformities Skin: warm and dry Psychological: alert and cooperative; normal mood and affect    Allergies  Allergen Reactions  . Metformin Diarrhea    Past Medical History:  Diagnosis Date  . Arthritis   . Back pain   . Chronic anticoagulation    due to aflutter  . Chronic kidney disease   . Diabetes mellitus   . Diastolic CHF, chronic ([place])    a.  echo 2006 - ef 55-65%; mild diast dysfxn;    b. Echo 08/2011: Mild LVH, EF 60%;  c. 04/2013 Echo: EF 65-69%, mild conc LVH;  08/2014 Echo: EF 60-65%, mild-mod MR.  . Gout   . Hyperlipidemia   . Hypertension    a.  Renal arterial Dopplers 12/2011: 1-59% right renal artery stenosis  . Morbid obesity ([place])   . Obstructive sleep apnea on CPAP   . Paroxysmal Afib/Flutter    a. 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   Social History   Socioeconomic History  . Marital status: Married    Spouse name: Not on file  . Number of children: 3  . Years of education: Not on file  . Highest education level: Not on file  Occupational History  . Occupation: DISABILITY/housewife    Employer: RETIRED  Tobacco Use  . Smoking status: Never Smoker  . Smokeless tobacco: Never Used  Vaping Use  . Vaping Use: Never used  Substance and Sexual Activity  . Alcohol use: No  . Drug use: No  .  Sexual activity: Yes  Other Topics Concern  . Not on file  Social History Narrative  . Not on file   Social Determinants of Health   Financial Resource Strain:   . Difficulty of Paying Living Expenses: Not on file  Food Insecurity:   . Worried About Charity fundraiser in the Last Year: Not on file  . Ran Out of Food in the Last Year: Not on file  Transportation Needs:   . Lack of Transportation (Medical): Not on file  . Lack of Transportation (Non-Medical): Not on file  Physical Activity:   . Days of Exercise per  Week: Not on file  . Minutes of Exercise per Session: Not on file  Stress:   . Feeling of Stress : Not on file  Social Connections:   . Frequency of Communication with Friends and Family: Not on file  . Frequency of Social Gatherings with Friends and Family: Not on file  . Attends Religious Services: Not on file  . Active Member of Clubs or Organizations: Not on file  . Attends Archivist Meetings: Not on file  . Marital Status: Not on file  Intimate Partner Violence:   . Fear of Current or Ex-Partner: Not on file  . Emotionally Abused: Not on file  . Physically Abused: Not on file  . Sexually Abused: Not on file   Family History  Problem Relation Age of Onset  . Heart disease Father   . Hypertension Father   . Breast cancer Sister   . Cancer Sister        breast   Past Surgical History:  Procedure Laterality Date  . APPENDECTOMY    . ATRIAL FLUTTER ABLATION N/A 09/24/2011   Procedure: ATRIAL FLUTTER ABLATION;  Surgeon: [name], MD;  Location: [facility] CATH LAB;  Service: Cardiovascular;  Laterality: N/A;  . CARDIOVERSION  10/22/2011   Procedure: CARDIOVERSION;  Surgeon: [name], MD;  Location: [facility];  Service: Cardiovascular;  Laterality: N/A;  . CARDIOVERSION N/A 09/10/2011   Procedure: CARDIOVERSION;  Surgeon: [name], MD;  Location: [facility] CATH LAB;  Service: Cardiovascular;  Laterality: N/A;  . CHOLECYSTECTOMY    . TONSILLECTOMY  1982  . TOTAL ABDOMINAL HYSTERECTOMY        Vanessa Kick, MD 06/20/20 [phone]

## 2020-06-19 ENCOUNTER — Other Ambulatory Visit: Payer: Self-pay | Admitting: Emergency Medicine

## 2020-06-19 ENCOUNTER — Other Ambulatory Visit: Payer: Self-pay

## 2020-06-19 ENCOUNTER — Telehealth: Payer: Self-pay

## 2020-06-19 ENCOUNTER — Ambulatory Visit (INDEPENDENT_AMBULATORY_CARE_PROVIDER_SITE_OTHER): Payer: Medicare Other | Admitting: Podiatry

## 2020-06-19 ENCOUNTER — Encounter: Payer: Self-pay | Admitting: Podiatry

## 2020-06-19 DIAGNOSIS — M79674 Pain in right toe(s): Secondary | ICD-10-CM | POA: Diagnosis not present

## 2020-06-19 DIAGNOSIS — M79675 Pain in left toe(s): Secondary | ICD-10-CM

## 2020-06-19 DIAGNOSIS — B351 Tinea unguium: Secondary | ICD-10-CM

## 2020-06-19 NOTE — Telephone Encounter (Signed)
Spoke with pt after speaking with her pcp regarding the heart med she is  Taking for a fib. Pt is now aware of the correct way to take the medication diltiazem based on her heart rate and blood pressure.

## 2020-06-19 NOTE — Progress Notes (Signed)
Subjective:  Patient ID: [name], female    DOB: 10-25-1940,  MRN: [id number]  79 y.o. female presents with painful thick toenails that are difficult to trim. Pain interferes with ambulation. Aggravating factors include wearing enclosed shoe gear. Pain is relieved with periodic professional debridement.   Patient states she got lost trying to find our office this morning. Also states her blood sugar is 252 mg/dl and she needs to take some insulin after her appointment.  She is traveling to [place] via train this Friday to visit her brother, who is ill.  Review of Systems: Negative except as noted in the HPI.  Past Medical History:  Diagnosis Date  . Arthritis   . Back pain   . Chronic anticoagulation    due to aflutter  . Chronic kidney disease   . Diabetes mellitus   . Diastolic CHF, chronic ([place])    a.  echo 2006 - ef 55-65%; mild diast dysfxn;    b. Echo 08/2011: Mild LVH, EF 60%;  c. 04/2013 Echo: EF 65-69%, mild conc LVH;  08/2014 Echo: EF 60-65%, mild-mod MR.  . Gout   . Hyperlipidemia   . Hypertension    a.  Renal arterial Dopplers 12/2011: 1-59% right renal artery stenosis  . Morbid obesity ([place])   . Obstructive sleep apnea on CPAP   . Paroxysmal Afib/Flutter    a. 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   Past Surgical History:  Procedure Laterality Date  . APPENDECTOMY    . ATRIAL FLUTTER ABLATION N/A 09/24/2011   Procedure: ATRIAL FLUTTER ABLATION;  Surgeon: [name], MD;  Location: [facility] CATH LAB;  Service: Cardiovascular;  Laterality: N/A;  . CARDIOVERSION  10/22/2011   Procedure: CARDIOVERSION;  Surgeon: [name], MD;  Location: [place];  Service: Cardiovascular;  Laterality: N/A;  . CARDIOVERSION N/A 09/10/2011   Procedure: CARDIOVERSION;  Surgeon: [name], MD;  Location: [facility] CATH LAB;  Service: Cardiovascular;  Laterality: N/A;  . CHOLECYSTECTOMY    . TONSILLECTOMY  1982  . TOTAL ABDOMINAL HYSTERECTOMY     Patient Active Problem List    Diagnosis Date Noted  . Uncontrolled hypertension 06/11/2020  . Aortic atherosclerosis ([place]) 03/28/2020  . Diabetic peripheral neuropathy ([place]) 12/26/2019  . Obesity 12/19/2015  . Hypersomnia 12/19/2015  . Acute diastolic congestive heart failure, NYHA class 3 ([place]) 10/03/2013  . Prolonged QT interval 10/03/2013  . Acute diastolic heart failure ([place]) 10/03/2013  . Long term (current) use of anticoagulants 06/09/2012  . BENIGN NEOPLASM OF ADRENAL GLAND 11/25/2010  . Chronic diastolic heart failure ([place]) 02/20/2010  . DM 05/16/2009  . GOUT 05/16/2009  . OBESITY, MORBID 05/16/2009  . Hypertension associated with diabetes ([place]) 05/16/2009  . Paroxysmal atrial fibrillation ([place]) 05/16/2009  . HYPERLIPIDEMIA 11/30/2008  . Obstructive sleep apnea on CPAP 11/30/2008    Current Outpatient Medications:  .  Accu-Chek Softclix Lancets lancets, 1 each by Other route 3 (three) times daily. as directed, Disp: 100 each, Rfl: 3 .  allopurinol (ZYLOPRIM) 300 MG tablet, Take 300 mg by mouth daily. , Disp: , Rfl:  .  amiodarone (PACERONE) 200 MG tablet, TAKE 1 TABLET BY MOUTH EVERY DAY. MONDAY THROUGH FRIDAY ONLY, Disp: 90 tablet, Rfl: 3 .  amLODipine (NORVASC) 5 MG tablet, Take 1 tablet (5 mg total) by mouth daily., Disp: 90 tablet, Rfl: 3 .  apixaban (ELIQUIS) 5 MG TABS tablet, Take 1 tablet (5 mg total) by mouth 2 (two) times daily., Disp: 180 tablet, Rfl: 1 .  blood glucose meter kit and supplies, Dispense based on patient and insurance preference. Use up to four times daily as directed. (FOR ICD-10 E10.9, E11.9)., Disp: 1 each, Rfl: 0 .  cloNIDine (CATAPRES) 0.2 MG tablet, Take 0.2 mg by mouth 2 (two) times daily., Disp: , Rfl:  .  cloNIDine (CATAPRES) 0.3 MG tablet, Take 1 tablet (0.3 mg total) by mouth 2 (two) times daily., Disp: 60 tablet, Rfl: 3 .  Continuous Blood Gluc Receiver (FREESTYLE LIBRE 14 DAY READER) DEVI, Use as directed., Disp: 1 each, Rfl: 0 .  diltiazem (CARDIZEM) 30 MG tablet, Take  one tablet every 4 hours as needed for heart rate greater than 100 and blood pressure needs to be above 100, Disp: 30 tablet, Rfl: 6 .  Insulin Lispro Prot & Lispro (HUMALOG 75/25 MIX) (75-25) 100 UNIT/ML Kwikpen, Inject 0-56 Units into the skin 3 (three) times daily as needed (high blood sugar). Sliding Scale, Disp: , Rfl:  .  levothyroxine (SYNTHROID, LEVOTHROID) 75 MCG tablet, Take 75 mcg daily before breakfast by mouth. , Disp: , Rfl:  .  loperamide (IMODIUM) 2 MG capsule, Take 1 capsule (2 mg total) by mouth 4 (four) times daily as needed for diarrhea or loose stools., Disp: 12 capsule, Rfl: 0 .  losartan (COZAAR) 100 MG tablet, Take 1 tablet (100 mg total) by mouth daily., Disp: 90 tablet, Rfl: 3 .  NOVOFINE PLUS 32G X 4 MM MISC, 1 each by Subdermal route 2 (two) times daily. as directed, Disp: 60 each, Rfl: 3 .  potassium chloride (KLOR-CON) 10 MEQ tablet, Take 2 tablets (20 mEq total) by mouth daily., Disp: 180 tablet, Rfl: 3 .  RESTASIS MULTIDOSE 0.05 % ophthalmic emulsion, Place 1 drop into both eyes 2 (two) times daily. , Disp: , Rfl:  .  rosuvastatin (CRESTOR) 10 MG tablet, Take 10 mg by mouth every evening., Disp: , Rfl:  .  Semaglutide,0.25 or 0.5MG/DOS, (OZEMPIC, 0.25 OR 0.5 MG/DOSE,) 2 MG/1.5ML SOPN, Inject 0.75 mLs (1 mg total) into the skin once a week. (Patient not taking: Reported on 06/11/2020), Disp: 12 pen, Rfl: 3 .  torsemide (DEMADEX) 20 MG tablet, Take 2 tablets (40 mg total) by mouth daily., Disp: 180 tablet, Rfl: 3 Allergies  Allergen Reactions  . Metformin Diarrhea   Social History   Tobacco Use  Smoking Status Never Smoker  Smokeless Tobacco Never Used   Objective:  There were no vitals filed for this visit. Constitutional Patient is a pleasant 79 y.o. Hispanic female morbidly obese in NAD.Marland Kitchen AAO x 3.  Vascular Capillary refill time to digits immediate b/l. Palpable pedal pulses b/l LE. Pedal hair present. Lower extremity skin temperature gradient within normal  limits. No edema noted b/l lower extremities. No cyanosis or clubbing noted.  Neurologic Normal speech. Oriented to person, place, and time. Protective sensation intact 5/5 intact bilaterally with 10g monofilament b/l.  Dermatologic Pedal skin with normal turgor, texture and tone bilaterally. No open wounds bilaterally. No interdigital macerations bilaterally. Toenails 1-5 b/l elongated, discolored, dystrophic, thickened, crumbly with subungual debris and tenderness to dorsal palpation.  Orthopedic: Normal muscle strength 5/5 to all lower extremity muscle groups bilaterally. No pain crepitus or joint limitation noted with ROM b/l. Hallux valgus with bunion deformity noted b/l lower extremities. Hammertoes noted to the b/l lower extremities. Patient ambulates independent of any assistive aids.   Hemoglobin A1C Latest Ref Rng & Units 03/28/2020 02/28/2020  HGBA1C 4.0 - 5.6 % 7.9(A) 7.8(A)  Some recent data might be hidden  Assessment:   1. Pain due to onychomycosis of toenails of both feet    Plan:  Patient was evaluated and treated and all questions answered.  Onychomycosis with pain -Nails palliatively debridement as below. -Educated on self-care  Procedure: Nail Debridement Rationale: Pain Type of Debridement: manual, sharp debridement. Instrumentation: Nail nipper, rotary burr. Number of Nails: 10  -Examined patient. Counseled on pedicures. -No new findings. No new orders. -Continue diabetic foot care principles. -Toenails 1-5 b/l were debrided in length and girth with sterile nail nippers and dremel without iatrogenic bleeding.  -Patient to report any pedal injuries to medical professional immediately. -Patient to continue soft, supportive shoe gear daily. -Patient/POA to call should there be question/concern in the interim.  Return in about 3 months (around 09/18/2020).  Marzetta Board, DPM

## 2020-06-20 NOTE — Progress Notes (Signed)
Name: [name]  MRN/ DOB: [id number], 1941/04/26   Age/ Sex: 79 y.o., female    PCP: Horald Pollen, MD   Reason for Endocrinology Evaluation: Type 2 Diabetes Mellitus     Date of Initial Endocrinology Visit: 06/21/2020     PATIENT IDENTIFIER: [name] is a 79 y.o. female with a past medical history of HTN, PAF, CHF, OSA and Dyslipidemia . The patient presented for initial endocrinology clinic visit on 06/21/2020 for consultative assistance with her diabetes management.    HPI: [name] was    Diagnosed with DM 2011 Prior Medications tried/Intolerance: Metformin- diarrhea  Currently checking blood sugars frequently with CGM  Hypoglycemia episodes : yes              Symptoms: nervousness      Frequency: rare  Hemoglobin A1c has ranged from 7.3% in 2011, peaking at 9.5% in 2014. Patient required assistance for hypoglycemia: no Patient has required hospitalization within the last 1 year from hyper or hypoglycemia: no  In terms of diet, the patient eats 2-3 meals. Does not snack . Avoids sugar sweetened beverages    HOME DIABETES REGIMEN: HUmalog Mix on a scale  Ozempic 0.5 mg weekly - stopped 6 weeks ago due to technical issues with the pen    Statin: Yes  ACE-I/ARB: yes Prior Diabetic Education: yes     CONTINUOUS GLUCOSE MONITORING RECORD INTERPRETATION    Dates of Recording: 8/20-06/21/2020  Sensor description:freestyle libre   Results statistics:   CGM use % of time 95  Average and SD 155/24.3  Time in range 76       %  % Time Above 180 22  % Time above 250 2  % Time Below target 0     Glycemic patterns summary: Most of her readings are optimal  Hyperglycemic episodes  Post prandial   Hypoglycemic episodes occurred  N/A      DIABETIC COMPLICATIONS: Microvascular complications:   Neuropathy  Denies: CKD, , retinopathy  Last eye exam: Completed 03/2020  Macrovascular complications:    Denies: CAD, PVD,  CVA   PAST HISTORY: Past Medical History:  Past Medical History:  Diagnosis Date   Arthritis    Back pain    Chronic anticoagulation    due to aflutter   Chronic kidney disease    Diabetes mellitus    Diastolic CHF, chronic ([place])    a.  echo 2006 - ef 55-65%; mild diast dysfxn;    b. Echo 08/2011: Mild LVH, EF 60%;  c. 04/2013 Echo: EF 65-69%, mild conc LVH;  08/2014 Echo: EF 60-65%, mild-mod MR.   Gout    Hyperlipidemia    Hypertension    a.  Renal arterial Dopplers 12/2011: 1-59% right renal artery stenosis   Morbid obesity ([place])    Obstructive sleep apnea on CPAP    Paroxysmal Afib/Flutter    a. 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   Past Surgical History:  Past Surgical History:  Procedure Laterality Date   APPENDECTOMY     ATRIAL FLUTTER ABLATION N/A 09/24/2011   Procedure: ATRIAL FLUTTER ABLATION;  Surgeon: [name], MD;  [facility] C[facility] LAB;  Service: Cardiovascular;  Laterality: N/A;   CARDIOVERSION  10/22/2011   Procedure: CARDIOVERSION;  Surgeon: [name], MD;  Location: [place];  Service: Cardiovascular;  Laterality: N/A;   CARDIOVERSION N/A 09/10/2011   Procedure: CARDIOVERSION;  Surgeon: [name], MD;  Location: [facility] CATH LAB;  Service: Cardiovascular;  Laterality: N/A;  CHOLECYSTECTOMY     TONSILLECTOMY  1982   TOTAL ABDOMINAL HYSTERECTOMY        Social History:  reports that she has never smoked. She has never used smokeless tobacco. She reports that she does not drink alcohol and does not use drugs. Family History:  Family History  Problem Relation Age of Onset   Heart disease Father    Hypertension Father    Breast cancer Sister    Cancer Sister        breast     HOME MEDICATIONS: Allergies as of 06/21/2020      Reactions   Metformin Diarrhea      Medication List       Accurate as of June 21, 2020 10:26 AM. If you have any questions, ask your nurse or doctor.        Accu-Chek Softclix Lancets  lancets 1 each by Other route 3 (three) times daily. as directed   allopurinol 300 MG tablet Commonly known as: ZYLOPRIM Take 300 mg by mouth daily.   amiodarone 200 MG tablet Commonly known as: PACERONE TAKE 1 TABLET BY MOUTH EVERY DAY. MONDAY THROUGH FRIDAY ONLY   amLODipine 5 MG tablet Commonly known as: NORVASC Take 1 tablet (5 mg total) by mouth daily.   apixaban 5 MG Tabs tablet Commonly known as: Eliquis Take 1 tablet (5 mg total) by mouth 2 (two) times daily.   blood glucose meter kit and supplies Dispense based on patient and insurance preference. Use up to four times daily as directed. (FOR ICD-10 E10.9, E11.9).   cloNIDine 0.3 MG tablet Commonly known as: Catapres Take 1 tablet (0.3 mg total) by mouth 2 (two) times daily. What changed: Another medication with the same name was removed. Continue taking this medication, and follow the directions you see here. Changed by: Dorita Sciara, MD   diltiazem 30 MG tablet Commonly known as: Cardizem Take one tablet every 4 hours as needed for heart rate greater than 100 and blood pressure needs to be above 100   FreeStyle Libre 14 Day Reader Kerrin Mo Use as directed.   Insulin Lispro Prot & Lispro (75-25) 100 UNIT/ML Kwikpen Commonly known as: HUMALOG 75/25 MIX Inject 0-56 Units into the skin 3 (three) times daily as needed (high blood sugar). Sliding Scale   levothyroxine 75 MCG tablet Commonly known as: SYNTHROID Take 75 mcg daily before breakfast by mouth.   loperamide 2 MG capsule Commonly known as: IMODIUM Take 1 capsule (2 mg total) by mouth 4 (four) times daily as needed for diarrhea or loose stools.   losartan 100 MG tablet Commonly known as: COZAAR Take 1 tablet (100 mg total) by mouth daily.   NovoFine Plus 32G X 4 MM Misc Generic drug: Insulin Pen Needle 1 each by Subdermal route 2 (two) times daily. as directed   Ozempic (0.25 or 0.5 MG/DOSE) 2 MG/1.5ML Sopn Generic drug: Semaglutide(0.25 or  0.5MG/DOS) Inject 0.75 mLs (1 mg total) into the skin once a week.   potassium chloride 10 MEQ tablet Commonly known as: KLOR-CON Take 2 tablets (20 mEq total) by mouth daily.   Restasis MultiDose 0.05 % ophthalmic emulsion Generic drug: cycloSPORINE Place 1 drop into both eyes 2 (two) times daily.   rosuvastatin 10 MG tablet Commonly known as: CRESTOR Take 10 mg by mouth every evening.   torsemide 20 MG tablet Commonly known as: DEMADEX Take 2 tablets (40 mg total) by mouth daily.        ALLERGIES: Allergies  Allergen Reactions   Metformin Diarrhea     REVIEW OF SYSTEMS: A comprehensive ROS was conducted with the patient and is negative except as per HPI and below:  Review of Systems  Gastrointestinal: Negative for diarrhea and nausea.  Neurological: Positive for tingling.       Mainly left foot      OBJECTIVE:   VITAL SIGNS: BP (!) 142/86 (BP Location: Left Arm, Patient Position: Sitting, Cuff Size: Normal)    Pulse (!) 128    Ht _0  (1.549 m)    Wt 256 lb 12.8 oz (116.5 kg)    SpO2 94%    BMI 48.52 kg/m    PHYSICAL EXAM:  General: Pt appears well and is in NAD  Neck: General: Supple without adenopathy or carotid bruits. Thyroid: Thyroid size normal.  No goiter or nodules appreciated. No thyroid bruit.  Lungs: Clear with good BS bilat with no rales, rhonchi, or wheezes  Heart: RRR with normal S1 and S2 and no gallops; no murmurs; no rub  Abdomen: Normoactive bowel sounds, soft, nontender, without masses or organomegaly palpable  Extremities:  Lower extremities - No pretibial edema. No lesions.  Skin: Normal texture and temperature to palpation.   Neuro: MS is good with appropriate affect, pt is alert and Ox3    DM foot exam: 9/2/221   The skin of the feet is intact without sores or ulcerations. The pedal pulses are 2+ on right and 2+ on left. The sensation is intact to a screening 5.07, 10 gram monofilament bilaterally  DATA REVIEWED:  Lab  Results  Component Value Date   HGBA1C 7.9 (A) 03/28/2020   HGBA1C 7.8 (A) 02/28/2020   HGBA1C 9.5 (H) 10/03/2013   Lab Results  Component Value Date   MICROALBUR 2.48 (H) 08/21/2009   LDLCALC 52 02/28/2020   CREATININE 0.74 05/01/2020     Lab Results  Component Value Date   CHOL 146 02/28/2020   HDL 58 02/28/2020   LDLCALC 52 02/28/2020   TRIG 226 (H) 02/28/2020   CHOLHDL 2.5 02/28/2020        ASSESSMENT / PLAN / RECOMMENDATIONS:   1) Type 2 Diabetes Mellitus, Sub-Optimally controlled, With Neuropathic complications - Most recent A1c of 7.9 %. Goal A1c < 7.0 %.    Plan: GENERAL: I have discussed with the patient the pathophysiology of diabetes. We went over the natural progression of the disease. We talked about both insulin resistance and insulin deficiency. We stressed the importance of lifestyle changes including diet and exercise. I explained the complications associated with diabetes including retinopathy, nephropathy, neuropathy as well as increased risk of cardiovascular disease. We went over the benefit seen with glycemic control.    I explained to the patient that diabetic patients are at higher than normal risk for amputations.   Pt uses humalog mix on a SS basis. She also tends to hold her insulin with meals if BG is in low 100's due to fear of hypoglycemia. Last night her pre-supper Bg was 138 mg/dL. She took 17 units of humalog mix and this am Bg was 105 mg/dL. This AM BG 105 mg/dL, she did not take insulin and 2.5 hr post prandial BG was 192 mg/dL.   She will restart Ozempic, I have offered to switch to oral form ( Rybelsus ) but she declined.   She will be provided with standing dose of   Will consider SGLT-2 inhibitors in the future  Intolerant to Metformin   MEDICATIONS: - Humalog  Mix 12 units with Breakfast and Supper  - Restart Ozempic 0.5 mg weekly   EDUCATION / INSTRUCTIONS:  BG monitoring instructions: Patient is instructed to check her blood  sugars 3 times a day, before meals  Call [place] [facility] if: BG persistently < 70   I reviewed the Rule of 15 for the treatment of hypoglycemia in detail with the patient. Literature supplied.   2) Diabetic complications:   Eye: Does not have known diabetic retinopathy.   Neuro/ Feet: Does  have known diabetic peripheral neuropathy.  Renal: Patient does not have known baseline CKD. She is  on an ACEI/ARB at present.  3) Mixed Hyperlipidemia. : Patient is  On rosuvastatin 10 mg daily  , LDL at goal at 52 mg/dL but her Tg elevated. Will monitor.         Signed electronically by: Mack Guise, MD  [facility]  [facility] [address] [place], [place] [address] Phone: [phone] FAX: [phone]   CC: Horald Pollen, MD [place] [address] Phone: [phone]  Fax: [phone]    Return to Endocrinology clinic as below: Future Appointments  Date Time Provider Lane  09/10/2020  8:00 AM Horald Pollen, MD PCP-PCP [facility]  09/28/2020 10:30 AM Marzetta Board, DPM [facility]  11/29/2020 10:00 AM Horald Pollen, MD PCP-PCP [facility]

## 2020-06-21 ENCOUNTER — Ambulatory Visit (INDEPENDENT_AMBULATORY_CARE_PROVIDER_SITE_OTHER): Payer: Medicare Other | Admitting: Internal Medicine

## 2020-06-21 ENCOUNTER — Encounter: Payer: Self-pay | Admitting: Internal Medicine

## 2020-06-21 ENCOUNTER — Other Ambulatory Visit: Payer: Self-pay

## 2020-06-21 VITALS — BP 142/86 | HR 128 | Ht 61.0 in | Wt 256.8 lb

## 2020-06-21 DIAGNOSIS — E782 Mixed hyperlipidemia: Secondary | ICD-10-CM | POA: Diagnosis not present

## 2020-06-21 DIAGNOSIS — E1165 Type 2 diabetes mellitus with hyperglycemia: Secondary | ICD-10-CM | POA: Diagnosis not present

## 2020-06-21 DIAGNOSIS — E1142 Type 2 diabetes mellitus with diabetic polyneuropathy: Secondary | ICD-10-CM | POA: Diagnosis not present

## 2020-06-21 DIAGNOSIS — E1169 Type 2 diabetes mellitus with other specified complication: Secondary | ICD-10-CM | POA: Insufficient documentation

## 2020-06-21 DIAGNOSIS — Z794 Long term (current) use of insulin: Secondary | ICD-10-CM | POA: Diagnosis not present

## 2020-06-21 LAB — MICROALBUMIN / CREATININE URINE RATIO
Creatinine,U: 101.4 mg/dL
Microalb Creat Ratio: 6.2 mg/g (ref 0.0–30.0)
Microalb, Ur: 6.3 mg/dL — ABNORMAL HIGH (ref 0.0–1.9)

## 2020-06-21 NOTE — Patient Instructions (Signed)
-   Humalog Mix 12 units with Breakfast and Supper  - Restart Ozempic 0.5 mg weekly       HOW TO TREAT LOW BLOOD SUGARS (Blood sugar LESS THAN 70 MG/DL)  Please follow the RULE OF 15 for the treatment of hypoglycemia treatment (when your (blood sugars are less than 70 mg/dL)    STEP 1: Take 15 grams of carbohydrates when your blood sugar is low, which includes:   3-4 GLUCOSE TABS  OR  3-4 OZ OF JUICE OR REGULAR SODA OR  ONE TUBE OF GLUCOSE GEL     STEP 2: RECHECK blood sugar in 15 MINUTES STEP 3: If your blood sugar is still low at the 15 minute recheck --> then, go back to STEP 1 and treat AGAIN with another 15 grams of carbohydrates.

## 2020-06-22 ENCOUNTER — Encounter: Payer: Self-pay | Admitting: Internal Medicine

## 2020-06-26 ENCOUNTER — Other Ambulatory Visit: Payer: Self-pay

## 2020-06-26 ENCOUNTER — Telehealth: Payer: Self-pay | Admitting: *Deleted

## 2020-06-26 ENCOUNTER — Ambulatory Visit (INDEPENDENT_AMBULATORY_CARE_PROVIDER_SITE_OTHER): Payer: Medicare Other | Admitting: Family Medicine

## 2020-06-26 ENCOUNTER — Encounter: Payer: Self-pay | Admitting: Family Medicine

## 2020-06-26 VITALS — BP 128/72 | HR 68 | Temp 97.7°F | Ht 61.0 in | Wt 253.0 lb

## 2020-06-26 DIAGNOSIS — R296 Repeated falls: Secondary | ICD-10-CM

## 2020-06-26 DIAGNOSIS — R079 Chest pain, unspecified: Secondary | ICD-10-CM

## 2020-06-26 MED ORDER — TRAMADOL HCL 50 MG PO TABS: 50.0000 mg | ORAL_TABLET | Freq: Three times a day (TID) | ORAL | 0 refills | Status: AC | PRN

## 2020-06-26 NOTE — Patient Instructions (Signed)
° ° ° °  If you have lab work done today you will be contacted with your lab results within the next 2 weeks.  If you have not heard from us then please contact us. The fastest way to get your results is to register for My Chart. ° ° °IF you received an x-ray today, you will receive an invoice from [facility]. Please contact [facility] at [phone] with questions or concerns regarding your invoice.  ° °IF you received labwork today, you will receive an invoice from [facility]. Please contact [facility] at [phone] with questions or concerns regarding your invoice.  ° °Our billing staff will not be able to assist you with questions regarding bills from these companies. ° °You will be contacted with the lab results as soon as they are available. The fastest way to get your results is to activate your My Chart account. Instructions are located on the last page of this paperwork. If you have not heard from us regarding the results in 2 weeks, please contact this office. °  ° ° ° °

## 2020-06-26 NOTE — Progress Notes (Signed)
9/7/20213:54 PM  [name] 1940-11-17, 79 y.o., female [id number]  Chief Complaint  Patient presents with  . Fall    had 2 falls while in [place] this past wk, 1 fell on right side hur ting  right leg, 2nd fall fell on to knees while storing cpap machine. Will need cpap replaced, left of train along with glucose meter. Taking tylenol for the body pain she is having.     HPI:   Patient is a 79 y.o. female with past medical history significant for DM2 with neuropathy, HTN, pAfib, HLP, hypothyroidism, who presents today for followup after falls  A week ago she went to visit her brother that was thought to be dying She stepped wrong off his front porch and fell off onto her left side Yesterday she was returning home, as she was placing cpap machine on luggage cart which got away from her causing her to fall forward hard onto her knees 3 men tried helping her to stand and were lifting her from underneath her arms which was painful since last night she started having constant sharp upper chest pain that radiates up into her neck and jaw, worse with deep breathing or movement, no nausea, no diaphoresis She is taking APAP  Depression screen [facility] 2/9 06/11/2020 05/31/2020 03/28/2020  Decreased Interest 0 0 0  Down, Depressed, Hopeless 0 0 0  PHQ - 2 Score 0 0 0  Some recent data might be hidden    Fall Risk  06/26/2020 06/11/2020 06/08/2020 05/31/2020 03/28/2020  Falls in the past year? 1 0 0 0 0  Number falls in past yr: 0 - 0 - 0  Injury with Fall? 1 - 0 - 0  Follow up - Falls evaluation completed Falls evaluation completed Falls evaluation completed Falls evaluation completed     Allergies  Allergen Reactions  . Metformin Diarrhea    Prior to Admission medications   Medication Sig Start Date End Date Taking? Authorizing Provider  Accu-Chek Softclix Lancets lancets 1 each by Other route 3 (three) times daily. as directed 03/30/20  Yes Maximiano Coss, NP  allopurinol (ZYLOPRIM) 300 MG  tablet Take 300 mg by mouth daily.  07/07/13  Yes [provider]  amiodarone (PACERONE) 200 MG tablet TAKE 1 TABLET BY MOUTH EVERY DAY. [place] ONLY 03/16/20  Yes [name], MD  amLODipine (NORVASC) 5 MG tablet Take 1 tablet (5 mg total) by mouth daily. 06/11/20  Yes Sagardia, Ines Bloomer, MD  apixaban (ELIQUIS) 5 MG TABS tablet Take 1 tablet (5 mg total) by mouth 2 (two) times daily. 06/14/20  Yes [name], MD  blood glucose meter kit and supplies Dispense based on patient and insurance preference. Use up to four times daily as directed. (FOR ICD-10 E10.9, E11.9). 05/03/20  Yes Sagardia, Ines Bloomer, MD  cloNIDine (CATAPRES) 0.3 MG tablet Take 1 tablet (0.3 mg total) by mouth 2 (two) times daily. 06/08/20  Yes Posey Boyer, MD  Continuous Blood Gluc Receiver (FREESTYLE LIBRE 14 DAY READER) DEVI Use as directed. 04/18/20  Yes Sagardia, Ines Bloomer, MD  diltiazem (CARDIZEM) 30 MG tablet Take one tablet every 4 hours as needed for heart rate greater than 100 and blood pressure needs to be above 100 05/09/20  Yes Sherran Needs, NP  Insulin Lispro Prot & Lispro (HUMALOG 75/25 MIX) (75-25) 100 UNIT/ML Kwikpen Inject 12 Units into the skin 2 (two) times daily with a meal.   08/12/16  Yes [provider]  levothyroxine (SYNTHROID, LEVOTHROID) 75 MCG tablet Take 75 mcg daily before breakfast by mouth.  12/29/16  Yes [provider]  loperamide (IMODIUM) 2 MG capsule Take 1 capsule (2 mg total) by mouth 4 (four) times daily as needed for diarrhea or loose stools. 03/20/20  Yes Belaya, Maria A, PA-C  losartan (COZAAR) 100 MG tablet Take 1 tablet (100 mg total) by mouth daily. 05/31/20  Yes Sagardia, Miguel Jose, MD  NOVOFINE PLUS 32G X 4 MM MISC 1 each by Subdermal route 2 (two) times daily. as directed 03/30/20  Yes Morrow, Richard, NP  potassium chloride (KLOR-CON) 10 MEQ tablet Take 2 tablets (20 mEq total) by mouth daily. 10/18/19  Yes Klein, Steven C, MD    RESTASIS MULTIDOSE 0.05 % ophthalmic emulsion Place 1 drop into both eyes 2 (two) times daily.  03/04/20  Yes [provider]  rosuvastatin (CRESTOR) 10 MG tablet Take 10 mg by mouth every evening.   Yes [provider]  Semaglutide,0.25 or 0.5MG/DOS, (OZEMPIC, 0.25 OR 0.5 MG/DOSE,) 2 MG/1.5ML SOPN Inject 0.75 mLs (1 mg total) into the skin once a week. 04/18/20  Yes Sagardia, Miguel Jose, MD  torsemide (DEMADEX) 20 MG tablet Take 2 tablets (40 mg total) by mouth daily. 09/29/19  Yes Klein, Steven C, MD    Past Medical History:  Diagnosis Date  . Arthritis   . Back pain   . Chronic anticoagulation    due to aflutter  . Chronic kidney disease   . Diabetes mellitus   . Diastolic CHF, chronic (HCC)    a.  echo 2006 - ef 55-65%; mild diast dysfxn;    b. Echo 08/2011: Mild LVH, EF 60%;  c. 04/2013 Echo: EF 65-69%, mild conc LVH;  08/2014 Echo: EF 60-65%, mild-mod MR.  . Gout   . Hyperlipidemia   . Hypertension    a.  Renal arterial Dopplers 12/2011: 1-59% right renal artery stenosis  . Morbid obesity (HCC)   . Obstructive sleep apnea on CPAP   . Paroxysmal Afib/Flutter    a. 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 APPENDECTOMY    . ATRIAL FLUTTER ABLATION N/A 09/24/2011   Procedure: ATRIAL FLUTTER ABLATION;  Surgeon: Gregg W Taylor, MD;  Location: [facility] CATH [facility];  Service: Cardiovascular;  Laterality: N/A;  . CARDIOVERSION  10/22/2011   Procedure: CARDIOVERSION;  Surgeon: Steven C Klein, MD;  Location: [facility] OR;  Service: Cardiovascular;  Laterality: N/A;  . CARDIOVERSION N/A 09/10/2011   Procedure: CARDIOVERSION;  Surgeon: Steven C Klein, MD;  Location: [facility] CATH [facility];  Service: Cardiovascular;  Laterality: N/A;  . CHOLECYSTECTOMY    . TONSILLECTOMY  1982  . TOTAL ABDOMINAL HYSTERECTOMY      Social History   Tobacco Use  . Smoking status: Never Smoker  . Smokeless tobacco: Never Used  Substance Use Topics  . Alcohol  use: No    Family History  Problem Relation Age of Onset  . Heart disease Father   . Hypertension Father   . Breast cancer Sister   . Cancer Sister        breast    ROS Per hpi  OBJECTIVE:  Today's Vitals   06/26/20 1543  BP: 128/72  Pulse: 68  Temp: 97.7 F (36.5 C)  SpO2: 94%  Weight: 253 lb (114.8 kg)  Height: 5' 1" (1.549 m)   Body mass index is 47.8 kg/m.   Physical Exam Vitals and nursing   note reviewed.  Constitutional:      Appearance: She is well-developed.  HENT:     Head: Normocephalic and atraumatic.     Mouth/Throat:     Pharynx: No oropharyngeal exudate.  Eyes:     General: No scleral icterus.    Extraocular Movements: Extraocular movements intact.     Conjunctiva/sclera: Conjunctivae normal.     Pupils: Pupils are equal, round, and reactive to light.  Cardiovascular:     Rate and Rhythm: Normal rate and regular rhythm.     Heart sounds: Normal heart sounds. No murmur heard.  No friction rub. No gallop.   Pulmonary:     Effort: Pulmonary effort is normal.     Breath sounds: Normal breath sounds. No wheezing, rhonchi or rales.  Chest:     Chest wall: Tenderness present. No swelling.  Musculoskeletal:     Cervical back: Neck supple.  Skin:    General: Skin is warm and dry.  Neurological:     Mental Status: She is alert and oriented to person, place, and time.     No results found for this or any previous visit (from the past 24 hour(s)).  No results found.  My interpretation of EKG:  Sinus, HR 70, 1st depress block, PRi 228, no ST changes  ASSESSMENT and PLAN  1. Chest pain, unspecified type Chest wall tenderness from fall. Cont supportive measures. Small rx for tramadol given, specially to takes at bedtime prn. Reviewed r/se/b, reviewed pmp - EKG 12-Lead - no acute findings  2. Recurrent falls  Other orders - traMADol (ULTRAM) 50 MG tablet; Take 1 tablet (50 mg total) by mouth every 8 (eight) hours as needed for up to 5  days.  No follow-ups on file.    Rutherford Guys, MD [facility] [place], [place] [address] Ph.  [phone] Fax [phone]

## 2020-06-26 NOTE — Telephone Encounter (Signed)
Pt called and stated that she had a fall over the weekend and she [place] suppose to go to her PCP's today at 3:00 to follow up.  Pt is on Eliquis. Pt was calling to make sure physician aware.

## 2020-06-27 ENCOUNTER — Ambulatory Visit: Payer: Medicare Other | Admitting: Podiatry

## 2020-06-27 NOTE — Telephone Encounter (Signed)
Spoke with pt who states she fell twice within a week and saw her PCP yesterday and was prescribed medication.  Pt states she believes she was in AFIB for about a week and has been taking cardiac medications as prescribed.  Pt's EKG at PCP office did not show AFIB.  Pt is due to be seen 08/2020 and will schedule appointment for follow up.  Pt denies other symptoms at this time other than pain from her falls which she is now taking Tramadol as needed prescribed by her PCP yesterday.

## 2020-07-02 ENCOUNTER — Encounter: Payer: Self-pay | Admitting: Family Medicine

## 2020-07-04 ENCOUNTER — Encounter: Payer: Self-pay | Admitting: Emergency Medicine

## 2020-07-04 ENCOUNTER — Ambulatory Visit (INDEPENDENT_AMBULATORY_CARE_PROVIDER_SITE_OTHER): Payer: Medicare Other

## 2020-07-04 ENCOUNTER — Ambulatory Visit (INDEPENDENT_AMBULATORY_CARE_PROVIDER_SITE_OTHER): Payer: Medicare Other | Admitting: Emergency Medicine

## 2020-07-04 ENCOUNTER — Other Ambulatory Visit: Payer: Self-pay

## 2020-07-04 VITALS — BP 151/92 | Temp 99.0°F | Resp 16 | Ht 61.0 in | Wt 257.0 lb

## 2020-07-04 DIAGNOSIS — Z6841 Body Mass Index (BMI) 40.0 and over, adult: Secondary | ICD-10-CM

## 2020-07-04 DIAGNOSIS — I10 Essential (primary) hypertension: Secondary | ICD-10-CM

## 2020-07-04 DIAGNOSIS — M25562 Pain in left knee: Secondary | ICD-10-CM | POA: Diagnosis not present

## 2020-07-04 DIAGNOSIS — S8392XA Sprain of unspecified site of left knee, initial encounter: Secondary | ICD-10-CM | POA: Diagnosis not present

## 2020-07-04 DIAGNOSIS — E1159 Type 2 diabetes mellitus with other circulatory complications: Secondary | ICD-10-CM

## 2020-07-04 DIAGNOSIS — I152 Hypertension secondary to endocrine disorders: Secondary | ICD-10-CM

## 2020-07-04 DIAGNOSIS — I480 Paroxysmal atrial fibrillation: Secondary | ICD-10-CM

## 2020-07-04 NOTE — Patient Instructions (Addendum)
Start taking Cardizem 30 mg twice a day. Call cardiologist office to set up earliest appointment.    If you have lab work done today you will be contacted with your lab results within the next 2 weeks.  If you have not heard from [place] then please contact us. The fastest way to get your results is to register for My Chart.   IF you received an x-ray today, you will receive an invoice from [facility]. Please contact [facility] at [phone] with questions or concerns regarding your invoice.   IF you received labwork today, you will receive an invoice from [facility]. Please contact [facility] at [phone] with questions or concerns regarding your invoice.   Our billing staff will not be able to assist you with questions regarding bills from these companies.  You will be contacted with the lab results as soon as they are available. The fastest way to get your results is to activate your My Chart account. Instructions are located on the last page of this paperwork. If you have not heard from [place] regarding the results in 2 weeks, please contact this office.     Atrial Fibrillation  Atrial fibrillation is a type of heartbeat that is irregular or fast. If you have this condition, your heart beats without any order. This makes it hard for your heart to pump blood in a normal way. Atrial fibrillation may come and go, or it may become a long-lasting problem. If this condition is not treated, it can put you at higher risk for stroke, heart failure, and other heart problems. What are the causes? This condition may be caused by diseases that damage the heart. They include:  High blood pressure.  Heart failure.  Heart valve disease.  Heart surgery. Other causes include:  Diabetes.  Thyroid disease.  Being overweight.  Kidney disease. Sometimes the cause is not known. What increases the risk? You are more likely to develop this condition if:  You are older.  You  smoke.  You exercise often and very hard.  You have a family history of this condition.  You are a man.  You use drugs.  You drink a lot of alcohol.  You have lung conditions, such as emphysema, pneumonia, or COPD.  You have sleep apnea. What are the signs or symptoms? Common symptoms of this condition include:  A feeling that your heart is beating very fast.  Chest pain or discomfort.  Feeling short of breath.  Suddenly feeling light-headed or weak.  Getting tired easily during activity.  Fainting.  Sweating. In some cases, there are no symptoms. How is this treated? Treatment for this condition depends on underlying conditions and how you feel when you have atrial fibrillation. They include:  Medicines to: ? Prevent blood clots. ? Treat heart rate or heart rhythm problems.  Using devices, such as a pacemaker, to correct heart rhythm problems.  Doing surgery to remove the part of the heart that sends bad signals.  Closing an area where clots can form in the heart (left atrial appendage). In some cases, your doctor will treat other underlying conditions. Follow these instructions at home: Medicines  Take over-the-counter and prescription medicines only as told by your doctor.  Do not take any new medicines without first talking to your doctor.  If you are taking blood thinners: ? Talk with your doctor before you take any medicines that have aspirin or NSAIDs, such as ibuprofen, in them. ? Take your medicine exactly as told  by your doctor. Take it at the same time each day. ? Avoid activities that could hurt or bruise you. Follow instructions about how to prevent falls. ? Wear a bracelet that says you are taking blood thinners. Or, carry a card that lists what medicines you take. Lifestyle      Do not use any products that have nicotine or tobacco in them. These include cigarettes, e-cigarettes, and chewing tobacco. If you need help quitting, ask your  doctor.  Eat heart-healthy foods. Talk with your doctor about the right eating plan for you.  Exercise regularly as told by your doctor.  Do not drink alcohol.  Lose weight if you are overweight.  Do not use drugs, including cannabis. General instructions  If you have a condition that causes breathing to stop for a short period of time (apnea), treat it as told by your doctor.  Keep a healthy weight. Do not use diet pills unless your doctor says they are safe for you. Diet pills may make heart problems worse.  Keep all follow-up visits as told by your doctor. This is important. Contact a doctor if:  You notice a change in the speed, rhythm, or strength of your heartbeat.  You are taking a blood-thinning medicine and you get more bruising.  You get tired more easily when you move or exercise.  You have a sudden change in weight. Get help right away if:   You have pain in your chest or your belly (abdomen).  You have trouble breathing.  You have side effects of blood thinners, such as blood in your vomit, poop (stool), or pee (urine), or bleeding that cannot stop.  You have any signs of a stroke. "BE FAST" is an easy way to remember the main warning signs: ? B - Balance. Signs are dizziness, sudden trouble walking, or loss of balance. ? E - Eyes. Signs are trouble seeing or a change in how you see. ? F - Face. Signs are sudden weakness or loss of feeling in the face, or the face or eyelid drooping on one side. ? A - Arms. Signs are weakness or loss of feeling in an arm. This happens suddenly and usually on one side of the body. ? S - Speech. Signs are sudden trouble speaking, slurred speech, or trouble understanding what people say. ? T - Time. Time to call emergency services. Write down what time symptoms started.  You have other signs of a stroke, such as: ? A sudden, very bad headache with no known cause. ? Feeling like you may vomit (nausea). ? Vomiting. ? A  seizure. These symptoms may be an emergency. Do not wait to see if the symptoms will go away. Get medical help right away. Call your local emergency services (911 in the [place] Do not drive yourself to the hospital. Summary  Atrial fibrillation is a type of heartbeat that is irregular or fast.  You are at higher risk of this condition if you smoke, are older, have diabetes, or are overweight.  Follow your doctor's instructions about medicines, diet, exercise, and follow-up visits.  Get help right away if you have signs or symptoms of a stroke.  Get help right away if you cannot catch your breath, or you have chest pain or discomfort. This information is not intended to replace advice given to you by your health care provider. Make sure you discuss any questions you have with your health care provider. Document Revised: 03/30/2019 Document Reviewed: 03/30/2019 Elsevier Patient  Education  [place]  A knee sprain is a stretch or tear in a knee ligament. Knee ligaments are bands of tissue that connect bones in the knee to each other. Follow these instructions at home: If you have a splint or brace:  Wear the splint or brace as told by your doctor. Remove it only as told by your doctor.  Loosen the splint or brace if your toes tingle, get numb, or turn cold and blue.  Keep the splint or brace clean.  If the splint or brace is not waterproof: ? Do not let it get wet. ? Cover it with a watertight covering when you take a bath or a shower. If you have a cast:  Do not stick anything inside the cast to scratch your skin.  Check the skin around the cast every day. Tell your doctor about any concerns.  You may put lotion on dry skin around the edges of the cast. Do not put lotion on the skin underneath the cast.  Keep the cast clean.  If the cast is not waterproof: ? Do not let it get wet. ? Cover it with a watertight covering when you take a bath or a  shower. Managing pain, stiffness, and swelling   Gently move your toes often to avoid stiffness and to lessen swelling.  Raise (elevate) the injured area above the level of your heart while you are sitting or lying down.  Take over-the-counter and prescription medicines only as told by your doctor.  If directed, put ice on the injured area. ? If you have a removable splint or brace, remove it as told by your doctor. ? Put ice in a plastic bag. ? Place a towel between your skin and the bag or between your cast and the bag. ? Leave the ice on for 20 minutes, 2-3 times a day. General instructions  Do exercises as told by your doctor.  Keep all follow-up visits as told by your doctor. This is important. Contact a doctor if:  You have pain that gets worse.  The cast, brace, or splint does not fit right.  The cast, brace, or splint gets damaged. Get help right away if:  You cannot lean on your knee to stand or walk.  You cannot move the injured area.  You knee buckles or you have pain after you walk only a few steps.  You have very bad pain, swelling, or numbness below the cast, brace, or splint. Summary  A knee sprain is a stretch or tear in a band (ligament) that connects your knee bones to each other.  You may need to wear a splint, brace, or cast to help your knee get better.  Contact your doctor if you have very bad pain, swelling, or numbness, or if you cannot walk. This information is not intended to replace advice given to you by your health care provider. Make sure you discuss any questions you have with your health care provider. Document Revised: 01/28/2019 Document Reviewed: 06/24/2016 Elsevier Patient Education  [place].

## 2020-07-04 NOTE — Assessment & Plan Note (Signed)
Rapid ventricular response today.  Clinically stable.  Normotensive.  Asymptomatic.  No clinical signs of heart failure.  On long-term anticoagulation.  Advised to start taking Cardizem 30 mg twice a day every day and follow-up with cardiologist in the next couple of weeks or as soon as possible.

## 2020-07-04 NOTE — Progress Notes (Signed)
[name] 79 y.o.   Chief Complaint  Patient presents with  . Fall    right side pain and second fall injury to both knee 1 week ago    HISTORY OF PRESENT ILLNESS: This is a 79 y.o. female complaining of pain to her left knee secondary to fall 1 week ago.  Feels unstable. Also has history of chronic atrial fibrillation, on medication and long-term anticoagulation.  Heart rate has been on the high side.  She was advised to take Cardizem 30 mg every 4 hours as needed for heart rate control. No other complaints or medical concerns today. Has history of insulin-dependent diabetes, recently seen by endocrinologist.  HPI   Prior to Admission medications   Medication Sig Start Date End Date Taking? Authorizing Provider  allopurinol (ZYLOPRIM) 300 MG tablet Take 300 mg by mouth daily.  07/07/13  Yes [provider]  amiodarone (PACERONE) 200 MG tablet TAKE 1 TABLET BY MOUTH EVERY DAY. [place] ONLY 03/16/20  Yes [name], MD  amLODipine (NORVASC) 5 MG tablet Take 1 tablet (5 mg total) by mouth daily. 06/11/20  Yes Deloria Brassfield, Ines Bloomer, MD  apixaban (ELIQUIS) 5 MG TABS tablet Take 1 tablet (5 mg total) by mouth 2 (two) times daily. 06/14/20  Yes [name], MD  cloNIDine (CATAPRES) 0.3 MG tablet Take 1 tablet (0.3 mg total) by mouth 2 (two) times daily. 06/08/20  Yes Posey Boyer, MD  diltiazem (CARDIZEM) 30 MG tablet Take one tablet every 4 hours as needed for heart rate greater than 100 and blood pressure needs to be above 100 05/09/20  Yes Sherran Needs, NP  Insulin Lispro Prot & Lispro (HUMALOG 75/25 MIX) (75-25) 100 UNIT/ML Kwikpen Inject 12 Units into the skin 2 (two) times daily with a meal.   08/12/16  Yes [provider]  levothyroxine (SYNTHROID, LEVOTHROID) 75 MCG tablet Take 75 mcg daily before breakfast by mouth.  12/29/16  Yes [provider]  loperamide (IMODIUM) 2 MG capsule Take 1 capsule (2 mg total) by mouth 4 (four)  times daily as needed for diarrhea or loose stools. 03/20/20  Yes Garald Balding, PA-C  losartan (COZAAR) 100 MG tablet Take 1 tablet (100 mg total) by mouth daily. 05/31/20  Yes Gladys Deckard, Ines Bloomer, MD  potassium chloride (KLOR-CON) 10 MEQ tablet Take 2 tablets (20 mEq total) by mouth daily. 10/18/19  Yes [name], MD  RESTASIS MULTIDOSE 0.05 % ophthalmic emulsion Place 1 drop into both eyes 2 (two) times daily.  03/04/20  Yes [provider]  rosuvastatin (CRESTOR) 10 MG tablet Take 10 mg by mouth every evening.   Yes [provider]  torsemide (DEMADEX) 20 MG tablet Take 2 tablets (40 mg total) by mouth daily. 09/29/19  Yes [name], MD  Accu-Chek Softclix Lancets lancets 1 each by Other route 3 (three) times daily. as directed Patient not taking: Reported on 07/04/2020 03/30/20   Maximiano Coss, NP  blood glucose meter kit and supplies Dispense based on patient and insurance preference. Use up to four times daily as directed. (FOR ICD-10 E10.9, E11.9). 05/03/20   Horald Pollen, MD  Continuous Blood Gluc Receiver (FREESTYLE LIBRE 14 DAY READER) DEVI Use as directed. 04/18/20   Horald Pollen, MD  NOVOFINE PLUS 32G X 4 MM MISC 1 each by Subdermal route 2 (two) times daily. as directed 03/30/20   Maximiano Coss, NP  Semaglutide,0.25 or 0.5MG/DOS, (OZEMPIC, 0.25 OR 0.5 MG/DOSE,)  2 MG/1.5ML SOPN Inject 0.75 mLs (1 mg total) into the skin once a week. Patient not taking: Reported on 07/04/2020 04/18/20   Horald Pollen, MD    Allergies  Allergen Reactions  . Metformin Diarrhea    Patient Active Problem List   Diagnosis Date Noted  . Type 2 diabetes mellitus with hyperglycemia, with long-term current use of insulin ([place]) 06/21/2020  . Type 2 diabetes mellitus with diabetic polyneuropathy, with long-term current use of insulin ([place]) 06/21/2020  . Mixed hyperlipidemia 06/21/2020  . Uncontrolled hypertension 06/11/2020  . Aortic atherosclerosis  ([place]) 03/28/2020  . Diabetic peripheral neuropathy ([place]) 12/26/2019  . Obesity 12/19/2015  . Hypersomnia 12/19/2015  . Acute diastolic congestive heart failure, NYHA class 3 ([place]) 10/03/2013  . Prolonged QT interval 10/03/2013  . Acute diastolic heart failure ([place]) 10/03/2013  . Long term (current) use of anticoagulants 06/09/2012  . BENIGN NEOPLASM OF ADRENAL GLAND 11/25/2010  . Chronic diastolic heart failure ([place]) 02/20/2010  . DM 05/16/2009  . GOUT 05/16/2009  . OBESITY, MORBID 05/16/2009  . Hypertension associated with diabetes ([place]) 05/16/2009  . Paroxysmal atrial fibrillation ([place]) 05/16/2009  . HYPERLIPIDEMIA 11/30/2008  . Obstructive sleep apnea on CPAP 11/30/2008    Past Medical History:  Diagnosis Date  . Arthritis   . Back pain   . Chronic anticoagulation    due to aflutter  . Chronic kidney disease   . Diabetes mellitus   . Diastolic CHF, chronic ([place])    a.  echo 2006 - ef 55-65%; mild diast dysfxn;    b. Echo 08/2011: Mild LVH, EF 60%;  c. 04/2013 Echo: EF 65-69%, mild conc LVH;  08/2014 Echo: EF 60-65%, mild-mod MR.  . Gout   . Hyperlipidemia   . Hypertension    a.  Renal arterial Dopplers 12/2011: 1-59% right renal artery stenosis  . Morbid obesity ([place])   . Obstructive sleep apnea on CPAP   . Paroxysmal Afib/Flutter    a. 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 APPENDECTOMY    . ATRIAL FLUTTER ABLATION N/A 09/24/2011   Procedure: ATRIAL FLUTTER ABLATION;  Surgeon: [name], MD;  Location: [facility] CATH LAB;  Service: Cardiovascular;  Laterality: N/A;  . CARDIOVERSION  10/22/2011   Procedure: CARDIOVERSION;  Surgeon: [name], MD;  Location: [facility];  Service: Cardiovascular;  Laterality: N/A;  . CARDIOVERSION N/A 09/10/2011   Procedure: CARDIOVERSION;  Surgeon: [name], MD;  Location: [facility] CATH LAB;  Service: Cardiovascular;  Laterality: N/A;  . CHOLECYSTECTOMY    . TONSILLECTOMY  1982  .  TOTAL ABDOMINAL HYSTERECTOMY      Social History   Socioeconomic History  . Marital status: Married    Spouse name: Not on file  . Number of children: 3  . Years of education: Not on file  . Highest education level: Not on file  Occupational History  . Occupation: DISABILITY/housewife    Employer: RETIRED  Tobacco Use  . Smoking status: Never Smoker  . Smokeless tobacco: Never Used  Vaping Use  . Vaping Use: Never used  Substance and Sexual Activity  . Alcohol use: No  . Drug use: No  . Sexual activity: Yes  Other Topics Concern  . Not on file  Social History Narrative  . Not on file   Social Determinants of Health   Financial Resource Strain:   . Difficulty of Paying Living Expenses: Not on file  Food Insecurity:   .  Worried About Charity fundraiser in the Last Year: Not on file  . Ran Out of Food in the Last Year: Not on file  Transportation Needs:   . Lack of Transportation (Medical): Not on file  . Lack of Transportation (Non-Medical): Not on file  Physical Activity:   . Days of Exercise per Week: Not on file  . Minutes of Exercise per Session: Not on file  Stress:   . Feeling of Stress : Not on file  Social Connections:   . Frequency of Communication with Friends and Family: Not on file  . Frequency of Social Gatherings with Friends and Family: Not on file  . Attends Religious Services: Not on file  . Active Member of Clubs or Organizations: Not on file  . Attends Archivist Meetings: Not on file  . Marital Status: Not on file  Intimate Partner Violence:   . Fear of Current or Ex-Partner: Not on file  . Emotionally Abused: Not on file  . Physically Abused: Not on file  . Sexually Abused: Not on file    Family History  Problem Relation Age of Onset  . Heart disease Father   . Hypertension Father   . Breast cancer Sister   . Cancer Sister        breast     Review of Systems  Constitutional: Negative.  Negative for chills and fever.    HENT: Negative.  Negative for congestion and sore throat.   Respiratory: Negative.  Negative for cough and shortness of breath.   Cardiovascular: Negative.  Negative for chest pain and palpitations.  Gastrointestinal: Negative for abdominal pain, nausea and vomiting.  Genitourinary: Negative.   Musculoskeletal: Positive for joint pain (Left knee).  Skin: Negative.  Negative for rash.  Neurological: Negative for dizziness and headaches.  All other systems reviewed and are negative.  Today's Vitals   07/04/20 0910  BP: (!) 151/92  Resp: 16  Temp: 99 F (37.2 C)  TempSrc: Temporal  SpO2: 98%  Weight: 257 lb (116.6 kg)  Height: '5\' 1"'  (1.549 m)   Body mass index is 48.56 kg/m.   Physical Exam Vitals reviewed.  Constitutional:      Appearance: She is obese.  HENT:     Head: Normocephalic.  Eyes:     Extraocular Movements: Extraocular movements intact.     Pupils: Pupils are equal, round, and reactive to light.  Cardiovascular:     Rate and Rhythm: Tachycardia present. Rhythm irregular.     Heart sounds: Normal heart sounds.  Pulmonary:     Effort: Pulmonary effort is normal.     Breath sounds: Normal breath sounds.  Musculoskeletal:     Comments: Left knee: No swelling or erythema.  No significant tenderness.  Full range of motion.  Skin:    General: Skin is warm and dry.     Capillary Refill: Capillary refill takes less than 2 seconds.  Neurological:     General: No focal deficit present.     Mental Status: She is alert and oriented to person, place, and time.  Psychiatric:        Mood and Affect: Mood normal.        Behavior: Behavior normal.      ASSESSMENT & PLAN: Paroxysmal atrial fibrillation (HCC) Rapid ventricular response today.  Clinically stable.  Normotensive.  Asymptomatic.  No clinical signs of heart failure.  On long-term anticoagulation.  Advised to start taking Cardizem 30 mg twice a day every day  and follow-up with cardiologist in the next couple  of weeks or as soon as possible.  Rendy was seen today for fall.  Diagnoses and all orders for this visit:  Acute pain of left knee -     Cancel: DG Knee Complete 4 Views Left -     DG Knee 3 Views Left  Body mass index (BMI) of 45.0-49.9 in adult [facility])  Sprain of left knee, unspecified ligament, initial encounter -     Ambulatory referral to Orthopedic Surgery  Hypertension associated with diabetes ([place])  Morbid obesity ([place])  Paroxysmal atrial fibrillation ([place])    Patient Instructions   Start taking Cardizem 30 mg twice a day. Call cardiologist office to set up earliest appointment.    If you have lab work done today you will be contacted with your lab results within the next 2 weeks.  If you have not heard from [place] then please contact us. The fastest way to get your results is to register for My Chart.   IF you received an x-ray today, you will receive an invoice from [facility] Radiology. Please contact [facility] at [phone] with questions or concerns regarding your invoice.   IF you received labwork today, you will receive an invoice from [facility]. Please contact [facility] at [phone] with questions or concerns regarding your invoice.   Our billing staff will not be able to assist you with questions regarding bills from these companies.  You will be contacted with the lab results as soon as they are available. The fastest way to get your results is to activate your My Chart account. Instructions are located on the last page of this paperwork. If you have not heard from [place] regarding the results in 2 weeks, please contact this office.     Atrial Fibrillation  Atrial fibrillation is a type of heartbeat that is irregular or fast. If you have this condition, your heart beats without any order. This makes it hard for your heart to pump blood in a normal way. Atrial fibrillation may come and go, or it may become a long-lasting problem. If this condition  is not treated, it can put you at higher risk for stroke, heart failure, and other heart problems. What are the causes? This condition may be caused by diseases that damage the heart. They include:  High blood pressure.  Heart failure.  Heart valve disease.  Heart surgery. Other causes include:  Diabetes.  Thyroid disease.  Being overweight.  Kidney disease. Sometimes the cause is not known. What increases the risk? You are more likely to develop this condition if:  You are older.  You smoke.  You exercise often and very hard.  You have a family history of this condition.  You are a man.  You use drugs.  You drink a lot of alcohol.  You have lung conditions, such as emphysema, pneumonia, or COPD.  You have sleep apnea. What are the signs or symptoms? Common symptoms of this condition include:  A feeling that your heart is beating very fast.  Chest pain or discomfort.  Feeling short of breath.  Suddenly feeling light-headed or weak.  Getting tired easily during activity.  Fainting.  Sweating. In some cases, there are no symptoms. How is this treated? Treatment for this condition depends on underlying conditions and how you feel when you have atrial fibrillation. They include:  Medicines to: ? Prevent blood clots. ? Treat heart rate or heart rhythm problems.  Using devices, such as  a pacemaker, to correct heart rhythm problems.  Doing surgery to remove the part of the heart that sends bad signals.  Closing an area where clots can form in the heart (left atrial appendage). In some cases, your doctor will treat other underlying conditions. Follow these instructions at home: Medicines  Take over-the-counter and prescription medicines only as told by your doctor.  Do not take any new medicines without first talking to your doctor.  If you are taking blood thinners: ? Talk with your doctor before you take any medicines that have aspirin or  NSAIDs, such as ibuprofen, in them. ? Take your medicine exactly as told by your doctor. Take it at the same time each day. ? Avoid activities that could hurt or bruise you. Follow instructions about how to prevent falls. ? Wear a bracelet that says you are taking blood thinners. Or, carry a card that lists what medicines you take. Lifestyle      Do not use any products that have nicotine or tobacco in them. These include cigarettes, e-cigarettes, and chewing tobacco. If you need help quitting, ask your doctor.  Eat heart-healthy foods. Talk with your doctor about the right eating plan for you.  Exercise regularly as told by your doctor.  Do not drink alcohol.  Lose weight if you are overweight.  Do not use drugs, including cannabis. General instructions  If you have a condition that causes breathing to stop for a short period of time (apnea), treat it as told by your doctor.  Keep a healthy weight. Do not use diet pills unless your doctor says they are safe for you. Diet pills may make heart problems worse.  Keep all follow-up visits as told by your doctor. This is important. Contact a doctor if:  You notice a change in the speed, rhythm, or strength of your heartbeat.  You are taking a blood-thinning medicine and you get more bruising.  You get tired more easily when you move or exercise.  You have a sudden change in weight. Get help right away if:   You have pain in your chest or your belly (abdomen).  You have trouble breathing.  You have side effects of blood thinners, such as blood in your vomit, poop (stool), or pee (urine), or bleeding that cannot stop.  You have any signs of a stroke. "BE FAST" is an easy way to remember the main warning signs: ? B - Balance. Signs are dizziness, sudden trouble walking, or loss of balance. ? E - Eyes. Signs are trouble seeing or a change in how you see. ? F - Face. Signs are sudden weakness or loss of feeling in the face, or  the face or eyelid drooping on one side. ? A - Arms. Signs are weakness or loss of feeling in an arm. This happens suddenly and usually on one side of the body. ? S - Speech. Signs are sudden trouble speaking, slurred speech, or trouble understanding what people say. ? T - Time. Time to call emergency services. Write down what time symptoms started.  You have other signs of a stroke, such as: ? A sudden, very bad headache with no known cause. ? Feeling like you may vomit (nausea). ? Vomiting. ? A seizure. These symptoms may be an emergency. Do not wait to see if the symptoms will go away. Get medical help right away. Call your local emergency services (911 in the [place] Do not drive yourself to the hospital. Summary  Atrial fibrillation  is a type of heartbeat that is irregular or fast.  You are at higher risk of this condition if you smoke, are older, have diabetes, or are overweight.  Follow your doctor's instructions about medicines, diet, exercise, and follow-up visits.  Get help right away if you have signs or symptoms of a stroke.  Get help right away if you cannot catch your breath, or you have chest pain or discomfort. This information is not intended to replace advice given to you by your health care provider. Make sure you discuss any questions you have with your health care provider. Document Revised: 03/30/2019 Document Reviewed: 03/30/2019 Elsevier Patient Education  [place].  Knee Sprain  A knee sprain is a stretch or tear in a knee ligament. Knee ligaments are bands of tissue that connect bones in the knee to each other. Follow these instructions at home: If you have a splint or brace:  Wear the splint or brace as told by your doctor. Remove it only as told by your doctor.  Loosen the splint or brace if your toes tingle, get numb, or turn cold and blue.  Keep the splint or brace clean.  If the splint or brace is not waterproof: ? Do not let it get  wet. ? Cover it with a watertight covering when you take a bath or a shower. If you have a cast:  Do not stick anything inside the cast to scratch your skin.  Check the skin around the cast every day. Tell your doctor about any concerns.  You may put lotion on dry skin around the edges of the cast. Do not put lotion on the skin underneath the cast.  Keep the cast clean.  If the cast is not waterproof: ? Do not let it get wet. ? Cover it with a watertight covering when you take a bath or a shower. Managing pain, stiffness, and swelling   Gently move your toes often to avoid stiffness and to lessen swelling.  Raise (elevate) the injured area above the level of your heart while you are sitting or lying down.  Take over-the-counter and prescription medicines only as told by your doctor.  If directed, put ice on the injured area. ? If you have a removable splint or brace, remove it as told by your doctor. ? Put ice in a plastic bag. ? Place a towel between your skin and the bag or between your cast and the bag. ? Leave the ice on for 20 minutes, 2-3 times a day. General instructions  Do exercises as told by your doctor.  Keep all follow-up visits as told by your doctor. This is important. Contact a doctor if:  You have pain that gets worse.  The cast, brace, or splint does not fit right.  The cast, brace, or splint gets damaged. Get help right away if:  You cannot lean on your knee to stand or walk.  You cannot move the injured area.  You knee buckles or you have pain after you walk only a few steps.  You have very bad pain, swelling, or numbness below the cast, brace, or splint. Summary  A knee sprain is a stretch or tear in a band (ligament) that connects your knee bones to each other.  You may need to wear a splint, brace, or cast to help your knee get better.  Contact your doctor if you have very bad pain, swelling, or numbness, or if you cannot walk. This  information is  not intended to replace advice given to you by your health care provider. Make sure you discuss any questions you have with your health care provider. Document Revised: 01/28/2019 Document Reviewed: 06/24/2016 Elsevier Patient Education  2020 Elsevier Inc.      Agustina Caroli, MD [facility]

## 2020-07-04 NOTE — Progress Notes (Signed)
Electrophysiology Office Note Date: 07/05/2020  ID:  [name], [name] [birth year], MRN [id number]  PCP: Horald Pollen, MD Electrophysiologist: Caryl Comes  CC: AF follow up  [name] is a 79 y.o. female seen today for Dr Caryl Comes.  She presents today for routine electrophysiology followup.  Since last being seen in our clinic, the patient reports doing reasonably well.  She feels that for the last 3 weeks she has been in AF. EKG at PCP office 9/7 showed SR.  She has had increased shortness of breath during that time.  She denies chest pain, palpitations, PND, orthopnea, nausea, vomiting, dizziness, syncope, edema, weight gain, or early satiety.  Past Medical History:  Diagnosis Date  . Arthritis   . Back pain   . Chronic anticoagulation    due to aflutter  . Chronic kidney disease   . Diabetes mellitus   . Diastolic CHF, chronic ([name])    a.  echo 2006 - ef 55-65%; mild diast dysfxn;    b. Echo 08/2011: Mild LVH, EF 60%;  c. 04/2013 Echo: EF 65-69%, mild conc LVH;  08/2014 Echo: EF 60-65%, mild-mod MR.  . Gout   . Hyperlipidemia   . Hypertension    a.  Renal arterial Dopplers 12/2011: 1-59% right renal artery stenosis  . Morbid obesity ([name])   . Obstructive sleep apnea on CPAP   . Paroxysmal Afib/Flutter    a. 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   Past Surgical History:  Procedure Laterality Date  . APPENDECTOMY    . ATRIAL FLUTTER ABLATION N/A 09/24/2011   Procedure: ATRIAL FLUTTER ABLATION;  Surgeon: [name], MD;  Location: [facility] CATH LAB;  Service: Cardiovascular;  Laterality: N/A;  . CARDIOVERSION  10/22/2011   Procedure: CARDIOVERSION;  Surgeon: [name], MD;  Location: [place];  Service: Cardiovascular;  Laterality: N/A;  . CARDIOVERSION N/A 09/10/2011   Procedure: CARDIOVERSION;  Surgeon: [name], MD;  Location: [facility] CATH LAB;  Service: Cardiovascular;  Laterality: N/A;  . CHOLECYSTECTOMY    . TONSILLECTOMY  1982  . TOTAL ABDOMINAL  HYSTERECTOMY      Current Outpatient Medications  Medication Sig Dispense Refill  . Accu-Chek Softclix Lancets lancets 1 each by Other route 3 (three) times daily. as directed 100 each 3  . allopurinol (ZYLOPRIM) 300 MG tablet Take 300 mg by mouth daily.     Marland Kitchen amiodarone (PACERONE) 200 MG tablet TAKE 1 TABLET BY MOUTH EVERY DAY. MONDAY THROUGH FRIDAY ONLY 90 tablet 3  . amLODipine (NORVASC) 5 MG tablet Take 1 tablet (5 mg total) by mouth daily. 90 tablet 3  . apixaban (ELIQUIS) 5 MG TABS tablet Take 1 tablet (5 mg total) by mouth 2 (two) times daily. 180 tablet 1  . blood glucose meter kit and supplies Dispense based on patient and insurance preference. Use up to four times daily as directed. (FOR ICD-10 E10.9, E11.9). 1 each 0  . cloNIDine (CATAPRES) 0.3 MG tablet Take 1 tablet (0.3 mg total) by mouth 2 (two) times daily. 60 tablet 3  . Continuous Blood Gluc Receiver (FREESTYLE LIBRE 14 DAY READER) DEVI Use as directed. 1 each 0  . diltiazem (CARDIZEM) 30 MG tablet Take one tablet every 4 hours as needed for heart rate greater than 100 and blood pressure needs to be above 100 30 tablet 6  . Insulin Lispro Prot & Lispro (HUMALOG 75/25 MIX) (75-25) 100 UNIT/ML Kwikpen Inject 12 Units into the skin 2 (two) times daily with a  meal.      . levothyroxine (SYNTHROID, LEVOTHROID) 75 MCG tablet Take 75 mcg daily before breakfast by mouth.     . loperamide (IMODIUM) 2 MG capsule Take 1 capsule (2 mg total) by mouth 4 (four) times daily as needed for diarrhea or loose stools. 12 capsule 0  . losartan (COZAAR) 100 MG tablet Take 1 tablet (100 mg total) by mouth daily. 90 tablet 3  . NOVOFINE PLUS 32G X 4 MM MISC 1 each by Subdermal route 2 (two) times daily. as directed 60 each 3  . potassium chloride (KLOR-CON) 10 MEQ tablet Take 2 tablets (20 mEq total) by mouth daily. 180 tablet 3  . RESTASIS MULTIDOSE 0.05 % ophthalmic emulsion Place 1 drop into both eyes 2 (two) times daily.     . rosuvastatin  (CRESTOR) 10 MG tablet Take 10 mg by mouth every evening.    . Semaglutide,0.25 or 0.5MG/DOS, (OZEMPIC, 0.25 OR 0.5 MG/DOSE,) 2 MG/1.5ML SOPN Inject 0.75 mLs (1 mg total) into the skin once a week. 12 pen 3  . torsemide (DEMADEX) 20 MG tablet Take 2 tablets (40 mg total) by mouth daily. 180 tablet 3   No current facility-administered medications for this visit.    Allergies:   Metformin   Social History: Social History   Socioeconomic History  . Marital status: Married    Spouse name: Not on file  . Number of children: 3  . Years of education: Not on file  . Highest education level: Not on file  Occupational History  . Occupation: DISABILITY/housewife    Employer: RETIRED  Tobacco Use  . Smoking status: Never Smoker  . Smokeless tobacco: Never Used  Vaping Use  . Vaping Use: Never used  Substance and Sexual Activity  . Alcohol use: No  . Drug use: No  . Sexual activity: Yes  Other Topics Concern  . Not on file  Social History Narrative  . Not on file   Social Determinants of Health   Financial Resource Strain:   . Difficulty of Paying Living Expenses: Not on file  Food Insecurity:   . Worried About Charity fundraiser in the Last Year: Not on file  . Ran Out of Food in the Last Year: Not on file  Transportation Needs:   . Lack of Transportation (Medical): Not on file  . Lack of Transportation (Non-Medical): Not on file  Physical Activity:   . Days of Exercise per Week: Not on file  . Minutes of Exercise per Session: Not on file  Stress:   . Feeling of Stress : Not on file  Social Connections:   . Frequency of Communication with Friends and Family: Not on file  . Frequency of Social Gatherings with Friends and Family: Not on file  . Attends Religious Services: Not on file  . Active Member of Clubs or Organizations: Not on file  . Attends Archivist Meetings: Not on file  . Marital Status: Not on file  Intimate Partner Violence:   . Fear of Current or  Ex-Partner: Not on file  . Emotionally Abused: Not on file  . Physically Abused: Not on file  . Sexually Abused: Not on file    Family History: Family History  Problem Relation Age of Onset  . Heart disease Father   . Hypertension Father   . Breast cancer Sister   . Cancer Sister        breast    Review of Systems: All other systems  reviewed and are otherwise negative except as noted above.   Physical Exam: VS:  BP 133/87   Pulse (!) 121   Ht _0  (1.549 m)   Wt 260 lb (117.9 kg)   SpO2 94%   BMI 49.13 kg/m  , BMI Body mass index is 49.13 kg/m. Wt Readings from Last 3 Encounters:  07/05/20 260 lb (117.9 kg)  07/04/20 257 lb (116.6 kg)  06/26/20 253 lb (114.8 kg)    GEN- The patient is elderly and obese appearing, alert and oriented x 3 today, in wheelchair   HEENT: normocephalic, atraumatic; sclera clear, conjunctiva pink; hearing intact; oropharynx clear; neck supple  Lungs- normal work of breathing, occasional expiratory wheeze  Heart- Irregular rate and rhythm  GI- soft, non-tender, non-distended, bowel sounds present  Extremities- no clubbing, cyanosis, or edema  MS- no significant deformity or atrophy Skin- warm and dry, no rash or lesion  Psych- euthymic mood, full affect Neuro- strength and sensation are intact   EKG:  EKG is ordered today. The ekg ordered today shows atrial fibrillation, rate 102  Recent Labs: 10/17/2019: NT-Pro BNP 136 02/21/2020: Magnesium 1.9; TSH 2.430 03/20/2020: ALT 18 05/01/2020: BUN 21; Creatinine, Ser 0.74; Hemoglobin 13.2; Platelets 341; Potassium 3.5; Sodium 141    Other studies Reviewed: Additional studies/ records that were reviewed today include:AF clinic notes   Assessment and Plan:  1.  Persistent atrial fibrillation Continue Eliquis for CHADS2VASC of 5 (transitioned from Warfarin 06/14/20) - she reports no missed doses since starting Increase Amiodarone to 225m bid x 2 weeks then resume current dosing Surveillance  labs today She has failed medical therapy with Tikosyn and now amiodarone.  I think her weight makes her a poor candidate for PVI.  V rates controlled - I do not think pace and ablate would help her symptoms  Risks, benefits to DCCV reviewed with patient who wishes to proceed. Will schedule at next available time.  Emphasized importance of no missed doses of Eliquis.   2.  HTN Stable No change required today  3.  Hypothyroidism Labs today  4.  Chronic diastolic heart failure Slightly volume overloaded likely 2/2 recurrent AF Continue Torsemide - ok to take an extra dose every other day until DCCV   5.  Obesity Body mass index is 49.13 kg/m. Weight loss recommended    Current medicines are reviewed at length with the patient today.   The patient does not have concerns regarding her medicines.  The following changes were made today:  Increase amiodarone as above  Labs/ tests ordered today include:  No orders of the defined types were placed in this encounter.    Disposition:   Follow up with Dr KCaryl Comesin 4-6 weeks    Signed, AChanetta Marshall NP 07/05/2020 9:44 AM   [id number] [phone](office) [phone](fax)

## 2020-07-05 ENCOUNTER — Ambulatory Visit (INDEPENDENT_AMBULATORY_CARE_PROVIDER_SITE_OTHER): Payer: Medicare Other | Admitting: Nurse Practitioner

## 2020-07-05 VITALS — BP 133/87 | HR 121 | Ht 61.0 in | Wt 260.0 lb

## 2020-07-05 DIAGNOSIS — I10 Essential (primary) hypertension: Secondary | ICD-10-CM | POA: Diagnosis not present

## 2020-07-05 DIAGNOSIS — I5032 Chronic diastolic (congestive) heart failure: Secondary | ICD-10-CM | POA: Diagnosis not present

## 2020-07-05 DIAGNOSIS — I480 Paroxysmal atrial fibrillation: Secondary | ICD-10-CM | POA: Diagnosis not present

## 2020-07-05 DIAGNOSIS — E039 Hypothyroidism, unspecified: Secondary | ICD-10-CM | POA: Diagnosis not present

## 2020-07-05 LAB — CBC WITH DIFFERENTIAL/PLATELET
Basophils Absolute: 0 10*3/uL (ref 0.0–0.2)
Basos: 0 %
EOS (ABSOLUTE): 0.2 10*3/uL (ref 0.0–0.4)
Eos: 2 %
Hematocrit: 37.1 % (ref 34.0–46.6)
Hemoglobin: 12.2 g/dL (ref 11.1–15.9)
Immature Grans (Abs): 0 10*3/uL (ref 0.0–0.1)
Immature Granulocytes: 0 %
Lymphocytes Absolute: 2.5 10*3/uL (ref 0.7–3.1)
Lymphs: 24 %
MCH: 26.3 pg — ABNORMAL LOW (ref 26.6–33.0)
MCHC: 32.9 g/dL (ref 31.5–35.7)
MCV: 80 fL (ref 79–97)
Monocytes Absolute: 1 10*3/uL — ABNORMAL HIGH (ref 0.1–0.9)
Monocytes: 9 %
Neutrophils Absolute: 7 10*3/uL (ref 1.4–7.0)
Neutrophils: 65 %
Platelets: 351 10*3/uL (ref 150–450)
RBC: 4.63 x10E6/uL (ref 3.77–5.28)
RDW: 14.3 % (ref 11.7–15.4)
WBC: 10.8 10*3/uL (ref 3.4–10.8)

## 2020-07-05 LAB — HEPATIC FUNCTION PANEL
ALT: 12 IU/L (ref 0–32)
AST: 13 IU/L (ref 0–40)
Albumin: 4.1 g/dL (ref 3.7–4.7)
Alkaline Phosphatase: 127 IU/L — ABNORMAL HIGH (ref 44–121)
Bilirubin Total: 0.7 mg/dL (ref 0.0–1.2)
Bilirubin, Direct: 0.23 mg/dL (ref 0.00–0.40)
Total Protein: 7.2 g/dL (ref 6.0–8.5)

## 2020-07-05 LAB — BASIC METABOLIC PANEL
BUN/Creatinine Ratio: 19 (ref 12–28)
BUN: 16 mg/dL (ref 8–27)
CO2: 29 mmol/L (ref 20–29)
Calcium: 9.1 mg/dL (ref 8.7–10.3)
Chloride: 97 mmol/L (ref 96–106)
Creatinine, Ser: 0.85 mg/dL (ref 0.57–1.00)
GFR calc Af Amer: 77 mL/min/{1.73_m2} (ref >59)
GFR calc non Af Amer: 67 mL/min/{1.73_m2} (ref >59)
Glucose: 160 mg/dL — ABNORMAL HIGH (ref 65–99)
Potassium: 3.7 mmol/L (ref 3.5–5.2)
Sodium: 140 mmol/L (ref 134–144)

## 2020-07-05 LAB — TSH: TSH: 2.05 u[IU]/mL (ref 0.450–4.500)

## 2020-07-05 NOTE — Patient Instructions (Addendum)
Medication Instructions:  Your physician has recommended you make the following change in your medication:  -- FOR THE NEXT 2 WEEKS INCREASE AMIODARONE -- Take 1 tablet (200 mg) by mouth twice daily, then resume 1 tablet (200 mg) by mouth daily Monday - Friday -- INCREASE TORESEMIDE -- Take 1 extra tablet (20 mg) every other day until you have your Cardioversion, then resume your normal dose of 2 tablets (40 mg) by mouth daily  *If you need a refill on your cardiac medications before your next appointment, please call your pharmacy*  Lab Work: You will need to have a pre procedure Covid Screening Test on 07/10/20 at 9:05 am --This is a Drive Up Visit at [address] [place], [place] [address].  --Someone will direct you to the appropriate testing line. Stay in your car and someone will be with you shortly.  Your physician has recommended that you have lab work today: BMET, CBC, Hepatic Function Panel, and TSH If you have labs (blood work) drawn today and your tests are completely normal, you will receive your results only by: Marland Kitchen MyChart Message (if you have MyChart) OR . A paper copy in the mail If you have any lab test that is abnormal or we need to change your treatment, we will call you to review the results.  Testing/Procedures: Your physician has recommended that you have a Cardioversion (DCCV). Electrical Cardioversion uses a jolt of electricity to your heart either through paddles or wired patches attached to your chest. This is a controlled, usually prescheduled, procedure. Defibrillation is done under light anesthesia in the hospital, and you usually go home the day of the procedure. This is done to get your heart back into a normal rhythm. You are not awake for the procedure. Please see the instruction sheet given to you today.  Follow-Up: At [facility], you and your health needs are our priority.  As part of our continuing mission to provide you with exceptional heart care,  we have created designated Provider Care Teams.  These Care Teams include your primary Cardiologist (physician) and Advanced Practice Providers (APPs -  Physician Assistants and Nurse Practitioners) who all work together to provide you with the care you need, when you need it.  We recommend signing up for the patient portal called "MyChart".  Sign up information is provided on this After Visit Summary.  MyChart is used to connect with patients for Virtual Visits (Telemedicine).  Patients are able to view lab/test results, encounter notes, upcoming appointments, etc.  Non-urgent messages can be sent to your provider as well.   To learn more about what you can do with MyChart, go to [email or web address].    Your next appointment:   Your physician recommends that you schedule a follow-up appointment in: 4-6 WEEKS with Dr. Caryl Comes. -- Wednesday 08/15/20 at 9:00 am  -- DO NOT MISS ANY DOSES OF YOUR ELIQUIS BETWEEN NOW AND THE TIME YOU HAVE YOUR CARDIOVERSION. IT IS VITAL THAT YOU TAKE EVERY DOSE OF YOUR BLOOD THINNER PRIOR TO THE PROCEDURE --    -------CARDIOVERSION INSTRUCTIONS-------   You are scheduled for a cardioversion on Friday 07/13/20 at 9:00 am with Dr. Gardiner Rhyme. Please go to [facility] [place] Stay at 7:30 am.  Enter through the [place] not have any food or drink after midnight on 07/12/20.  You may take your medicines with a sip of water on the day of your procedure.  You will need someone to drive  you home following your procedure.   Call the [place] office at [phone] if you have any questions, problems or concerns.     Electrical Cardioversion Electrical cardioversion is the delivery of a jolt of electricity to change the rhythm of the heart. Sticky patches or metal paddles are placed on the chest to deliver the electricity from a device. This is done to restore a normal rhythm. A rhythm that is too fast or not regular  keeps the heart from pumping well. Electrical cardioversion is done in an emergency if:   There is low or no blood pressure as a result of the heart rhythm.   Normal rhythm must be restored as fast as possible to protect the brain and heart from further damage.   It may save a life. Cardioversion may be done for heart rhythms that are not immediately life threatening, such as atrial fibrillation or flutter, in which:   The heart is beating too fast or is not regular.   Medicine to change the rhythm has not worked.   It is safe to wait in order to allow time for preparation.  Symptoms of the abnormal rhythm are bothersome.  The risk of stroke and other serious problems can be reduced.  LET [facility] CARE PROVIDER KNOW ABOUT:   Any allergies you have.  All medicines you are taking, including vitamins, herbs, eye drops, creams, and over-the-counter medicines.  Previous problems you or members of your family have had with the use of anesthetics.   Any blood disorders you have.   Previous surgeries you have had.   Medical conditions you have.   RISKS AND COMPLICATIONS  Generally, this is a safe procedure. However, problems can occur and include:   Breathing problems related to the anesthetic used.  A blood clot that breaks free and travels to other parts of your body. This could cause a stroke or other problems. The risk of this is lowered by use of blood-thinning medicine (anticoagulant) prior to the procedure.  Cardiac arrest (rare).   BEFORE THE PROCEDURE   You may have tests to detect blood clots in your heart and to evaluate heart function.  You may start taking anticoagulants so your blood does not clot as easily.   Medicines may be given to help stabilize your heart rate and rhythm.   PROCEDURE  You will be given medicine through an IV tube to reduce discomfort and make you sleepy (sedative).   An electrical shock will be delivered.   AFTER  THE PROCEDURE Your heart rhythm will be watched to make sure it does not change. You will need someone to drive you home

## 2020-07-05 NOTE — Addendum Note (Signed)
Addended by: Mendel Ryder on: 07/05/2020 04:55 PM   Modules accepted: Orders

## 2020-07-06 ENCOUNTER — Other Ambulatory Visit: Payer: Self-pay

## 2020-07-06 ENCOUNTER — Telehealth: Payer: Self-pay | Admitting: Emergency Medicine

## 2020-07-06 DIAGNOSIS — M25562 Pain in left knee: Secondary | ICD-10-CM

## 2020-07-06 NOTE — Progress Notes (Unsigned)
dme

## 2020-07-06 NOTE — Telephone Encounter (Signed)
Looking back at the notes from 07/04/2020 I see that the pt was seen for a fall and now she is asking for referral for a walker and a bedside commode. Is it ok to place referral.  Please Advise.

## 2020-07-06 NOTE — Telephone Encounter (Signed)
[name], from [facility], called stating that the pt is needing an order for a walker and a Bariatric bedside commode. She states that she contacted [employer] and they have these in stock they are just needing the order faxed over. Please advise.     Phone # [phone]  Fax# [phone]

## 2020-07-07 ENCOUNTER — Other Ambulatory Visit: Payer: Self-pay

## 2020-07-07 ENCOUNTER — Encounter (HOSPITAL_COMMUNITY): Payer: Self-pay | Admitting: Emergency Medicine

## 2020-07-07 DIAGNOSIS — I4892 Unspecified atrial flutter: Secondary | ICD-10-CM | POA: Insufficient documentation

## 2020-07-07 DIAGNOSIS — M542 Cervicalgia: Secondary | ICD-10-CM | POA: Insufficient documentation

## 2020-07-07 DIAGNOSIS — N189 Chronic kidney disease, unspecified: Secondary | ICD-10-CM | POA: Insufficient documentation

## 2020-07-07 DIAGNOSIS — Z794 Long term (current) use of insulin: Secondary | ICD-10-CM | POA: Insufficient documentation

## 2020-07-07 DIAGNOSIS — W19XXXA Unspecified fall, initial encounter: Secondary | ICD-10-CM | POA: Diagnosis not present

## 2020-07-07 DIAGNOSIS — I480 Paroxysmal atrial fibrillation: Secondary | ICD-10-CM | POA: Diagnosis not present

## 2020-07-07 DIAGNOSIS — I130 Hypertensive heart and chronic kidney disease with heart failure and stage 1 through stage 4 chronic kidney disease, or unspecified chronic kidney disease: Secondary | ICD-10-CM | POA: Diagnosis not present

## 2020-07-07 DIAGNOSIS — E1122 Type 2 diabetes mellitus with diabetic chronic kidney disease: Secondary | ICD-10-CM | POA: Insufficient documentation

## 2020-07-07 DIAGNOSIS — I509 Heart failure, unspecified: Secondary | ICD-10-CM | POA: Diagnosis not present

## 2020-07-07 DIAGNOSIS — Z743 Need for continuous supervision: Secondary | ICD-10-CM | POA: Diagnosis not present

## 2020-07-07 DIAGNOSIS — R1011 Right upper quadrant pain: Secondary | ICD-10-CM | POA: Diagnosis not present

## 2020-07-07 DIAGNOSIS — M546 Pain in thoracic spine: Secondary | ICD-10-CM | POA: Diagnosis not present

## 2020-07-07 DIAGNOSIS — Z7901 Long term (current) use of anticoagulants: Secondary | ICD-10-CM | POA: Diagnosis not present

## 2020-07-07 DIAGNOSIS — E1140 Type 2 diabetes mellitus with diabetic neuropathy, unspecified: Secondary | ICD-10-CM | POA: Diagnosis not present

## 2020-07-07 DIAGNOSIS — R52 Pain, unspecified: Secondary | ICD-10-CM | POA: Diagnosis not present

## 2020-07-07 DIAGNOSIS — I5032 Chronic diastolic (congestive) heart failure: Secondary | ICD-10-CM | POA: Diagnosis not present

## 2020-07-07 DIAGNOSIS — Z79899 Other long term (current) drug therapy: Secondary | ICD-10-CM | POA: Insufficient documentation

## 2020-07-07 DIAGNOSIS — I4891 Unspecified atrial fibrillation: Secondary | ICD-10-CM | POA: Diagnosis not present

## 2020-07-07 DIAGNOSIS — R0602 Shortness of breath: Secondary | ICD-10-CM | POA: Diagnosis not present

## 2020-07-07 DIAGNOSIS — E1165 Type 2 diabetes mellitus with hyperglycemia: Secondary | ICD-10-CM | POA: Insufficient documentation

## 2020-07-07 NOTE — ED Triage Notes (Signed)
Patient arrives with complaints of continuing left knee pain that began with a fall over a week ago. Patient states that she is also having RUQ pain that began 12 hours ago. Patient states pain worsens on inspiration.

## 2020-07-08 ENCOUNTER — Emergency Department (HOSPITAL_COMMUNITY)
Admission: EM | Admit: 2020-07-08 | Discharge: 2020-07-08 | Disposition: A | Discharge status: Home / Self Care | Payer: Medicare Other | Attending: Emergency Medicine | Admitting: Emergency Medicine

## 2020-07-08 ENCOUNTER — Emergency Department (HOSPITAL_COMMUNITY): Payer: Medicare Other

## 2020-07-08 DIAGNOSIS — I480 Paroxysmal atrial fibrillation: Secondary | ICD-10-CM

## 2020-07-08 DIAGNOSIS — I509 Heart failure, unspecified: Secondary | ICD-10-CM | POA: Diagnosis not present

## 2020-07-08 DIAGNOSIS — M549 Dorsalgia, unspecified: Secondary | ICD-10-CM

## 2020-07-08 DIAGNOSIS — M546 Pain in thoracic spine: Secondary | ICD-10-CM | POA: Diagnosis not present

## 2020-07-08 DIAGNOSIS — R1011 Right upper quadrant pain: Secondary | ICD-10-CM | POA: Diagnosis not present

## 2020-07-08 LAB — TROPONIN I (HIGH SENSITIVITY): Troponin I (High Sensitivity): 7 ng/L (ref <18)

## 2020-07-08 LAB — CBC
HCT: 42.3 % (ref 36.0–46.0)
Hemoglobin: 12.7 g/dL (ref 12.0–15.0)
MCH: 26.3 pg (ref 26.0–34.0)
MCHC: 30 g/dL (ref 30.0–36.0)
MCV: 87.8 fL (ref 80.0–100.0)
Platelets: 385 10*3/uL (ref 150–400)
RBC: 4.82 MIL/uL (ref 3.87–5.11)
RDW: 16.2 % — ABNORMAL HIGH (ref 11.5–15.5)
WBC: 15 10*3/uL — ABNORMAL HIGH (ref 4.0–10.5)
nRBC: 0 % (ref 0.0–0.2)

## 2020-07-08 LAB — COMPREHENSIVE METABOLIC PANEL
ALT: 15 U/L (ref 0–44)
AST: 19 U/L (ref 15–41)
Albumin: 4 g/dL (ref 3.5–5.0)
Alkaline Phosphatase: 105 U/L (ref 38–126)
Anion gap: 14 (ref 5–15)
BUN: 23 mg/dL (ref 8–23)
CO2: 29 mmol/L (ref 22–32)
Calcium: 9 mg/dL (ref 8.9–10.3)
Chloride: 97 mmol/L — ABNORMAL LOW (ref 98–111)
Creatinine, Ser: 0.86 mg/dL (ref 0.44–1.00)
GFR calc Af Amer: >60 mL/min (ref >60)
GFR calc non Af Amer: >60 mL/min (ref >60)
Glucose, Bld: 134 mg/dL — ABNORMAL HIGH (ref 70–99)
Potassium: 3.3 mmol/L — ABNORMAL LOW (ref 3.5–5.1)
Sodium: 140 mmol/L (ref 135–145)
Total Bilirubin: 0.9 mg/dL (ref 0.3–1.2)
Total Protein: 8 g/dL (ref 6.5–8.1)

## 2020-07-08 MED ORDER — FENTANYL CITRATE (PF) 100 MCG/2ML IJ SOLN
50.0000 ug | Freq: Once | INTRAMUSCULAR | Status: AC
  Administered 2020-07-08: 50 ug via INTRAVENOUS
  Filled 2020-07-08: qty 2

## 2020-07-08 MED ORDER — LIDOCAINE 5 % EX PTCH: 1.0000 | MEDICATED_PATCH | Freq: Every day | CUTANEOUS | 0 refills | Status: DC | PRN

## 2020-07-08 MED ORDER — OXYCODONE HCL 5 MG PO TABS
5.0000 mg | ORAL_TABLET | Freq: Four times a day (QID) | ORAL | 0 refills | Status: AC | PRN
Start: 2020-07-08 — End: 2020-07-11

## 2020-07-08 MED ORDER — SODIUM CHLORIDE 0.9 % IV BOLUS: 1000.0000 mL | Freq: Once | INTRAVENOUS | Status: DC

## 2020-07-08 MED ORDER — APIXABAN 5 MG PO TABS
5.0000 mg | ORAL_TABLET | Freq: Two times a day (BID) | ORAL | Status: DC
  Administered 2020-07-08: 5 mg via ORAL
  Filled 2020-07-08: qty 1

## 2020-07-08 MED ORDER — ACETAMINOPHEN ER 650 MG PO TBCR: 650.0000 mg | EXTENDED_RELEASE_TABLET | Freq: Three times a day (TID) | ORAL | 0 refills | Status: DC | PRN

## 2020-07-08 MED ORDER — LIDOCAINE 5 % EX PTCH
1.0000 | MEDICATED_PATCH | CUTANEOUS | Status: DC
  Administered 2020-07-08: 1 via TRANSDERMAL
  Filled 2020-07-08: qty 1

## 2020-07-08 MED ORDER — DILTIAZEM HCL 30 MG PO TABS
30.0000 mg | ORAL_TABLET | Freq: Once | ORAL | Status: AC
  Administered 2020-07-08: 30 mg via ORAL
  Filled 2020-07-08: qty 1

## 2020-07-08 MED ORDER — CYCLOBENZAPRINE HCL 5 MG PO TABS: 5.0000 mg | ORAL_TABLET | Freq: Three times a day (TID) | ORAL | 0 refills | Status: AC

## 2020-07-08 MED ORDER — MORPHINE SULFATE (PF) 4 MG/ML IV SOLN
4.0000 mg | Freq: Once | INTRAVENOUS | Status: DC
  Filled 2020-07-08: qty 1

## 2020-07-08 NOTE — Telephone Encounter (Signed)
It is ok to place referral. Thanks.

## 2020-07-08 NOTE — ED Provider Notes (Signed)
[facility] DEPT Provider Note   CSN: [id number] Arrival date & time: 07/07/20  2056     History Chief Complaint  Patient presents with  . Back Pain    [name] is a 79 y.o. female with a history of diastolic CHF, DM, hypertension, hyperlipidemia, paroxsymal afib on eliquis, OSA on CPAP, & obesity who presents to the ED with complaints of back pain x 4-5 days. Patient states pain is located to the right side of her mid to upper back as well as to the right side of her neck. The pain is constant, feels like a spasms/tightness and is sharp at times. It is worse with turning of the head as well as twisting/turning motions, deep breathing, palpation, and coughing. No alleviating factors. She mentions she had falls on the 3rd & the 6th of September- no head injury or LOC with these, and she was only have knee pain, none of her current back/neck pain associated with the falls. Knee pain has been evaluated by PCP with x-ray and orthopedics referral. She has been back in afib, compliant with eliquis, scheduled for cardioversion with cardiology team next week. She denies fever, chills, significant cough, dyspnea, vomiting, numbness, weakness, visual disturbance, dizziness, or syncope.  HPI     Past Medical History:  Diagnosis Date  . Arthritis   . Back pain   . Chronic anticoagulation    due to aflutter  . Chronic kidney disease   . Diabetes mellitus   . Diastolic CHF, chronic ([place])    a.  echo 2006 - ef 55-65%; mild diast dysfxn;    b. Echo 08/2011: Mild LVH, EF 60%;  c. 04/2013 Echo: EF 65-69%, mild conc LVH;  08/2014 Echo: EF 60-65%, mild-mod MR.  . Gout   . Hyperlipidemia   . Hypertension    a.  Renal arterial Dopplers 12/2011: 1-59% right renal artery stenosis  . Morbid obesity ([place])   . Obstructive sleep apnea on CPAP   . Paroxysmal Afib/Flutter    a. 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    Patient Active Problem List   Diagnosis Date Noted    . Type 2 diabetes mellitus with hyperglycemia, with long-term current use of insulin ([place]) 06/21/2020  . Type 2 diabetes mellitus with diabetic polyneuropathy, with long-term current use of insulin ([place]) 06/21/2020  . Mixed hyperlipidemia 06/21/2020  . Uncontrolled hypertension 06/11/2020  . Aortic atherosclerosis ([place]) 03/28/2020  . Diabetic peripheral neuropathy ([place]) 12/26/2019  . Obesity 12/19/2015  . Hypersomnia 12/19/2015  . Acute diastolic congestive heart failure, NYHA class 3 ([place]) 10/03/2013  . Prolonged QT interval 10/03/2013  . Acute diastolic heart failure ([place]) 10/03/2013  . Long term (current) use of anticoagulants 06/09/2012  . BENIGN NEOPLASM OF ADRENAL GLAND 11/25/2010  . Chronic diastolic heart failure ([place]) 02/20/2010  . DM 05/16/2009  . GOUT 05/16/2009  . OBESITY, MORBID 05/16/2009  . Hypertension associated with diabetes ([place]) 05/16/2009  . Paroxysmal atrial fibrillation ([place]) 05/16/2009  . HYPERLIPIDEMIA 11/30/2008  . Obstructive sleep apnea on CPAP 11/30/2008    Past Surgical History:  Procedure Laterality Date  . APPENDECTOMY    . ATRIAL FLUTTER ABLATION N/A 09/24/2011   Procedure: ATRIAL FLUTTER ABLATION;  Surgeon: [name], MD;  Location: [facility] CATH LAB;  Service: Cardiovascular;  Laterality: N/A;  . CARDIOVERSION  10/22/2011   Procedure: CARDIOVERSION;  Surgeon: [name], MD;  Location: [facility];  Service: Cardiovascular;  Laterality: N/A;  . CARDIOVERSION N/A 09/10/2011   Procedure:  CARDIOVERSION;  Surgeon: [name], MD;  Location: [facility] CATH LAB;  Service: Cardiovascular;  Laterality: N/A;  . CHOLECYSTECTOMY    . TONSILLECTOMY  1982  . TOTAL ABDOMINAL HYSTERECTOMY       OB History   No obstetric history on file.     Family History  Problem Relation Age of Onset  . Heart disease Father   . Hypertension Father   . Breast cancer Sister   . Cancer Sister        breast    Social History   Tobacco Use  . Smoking status: Never  Smoker  . Smokeless tobacco: Never Used  Vaping Use  . Vaping Use: Never used  Substance Use Topics  . Alcohol use: No  . Drug use: No    Home Medications Prior to Admission medications   Medication Sig Start Date End Date Taking? Authorizing Provider  Accu-Chek Softclix Lancets lancets 1 each by Other route 3 (three) times daily. as directed 03/30/20   Maximiano Coss, NP  allopurinol (ZYLOPRIM) 300 MG tablet Take 300 mg by mouth daily.  07/07/13   [provider]  amiodarone (PACERONE) 200 MG tablet TAKE 1 TABLET BY MOUTH EVERY DAY. [place] Patient taking differently: Take 400 mg by mouth every Monday, Tuesday, Wednesday, Thursday, and Friday. TAKE 1 TABLET BY MOUTH EVERY DAY. [place] ONLY 03/16/20   [name], MD  amLODipine (NORVASC) 5 MG tablet Take 1 tablet (5 mg total) by mouth daily. 06/11/20   Horald Pollen, MD  apixaban (ELIQUIS) 5 MG TABS tablet Take 1 tablet (5 mg total) by mouth 2 (two) times daily. 06/14/20   [name], MD  blood glucose meter kit and supplies Dispense based on patient and insurance preference. Use up to four times daily as directed. (FOR ICD-10 E10.9, E11.9). 05/03/20   Horald Pollen, MD  cloNIDine (CATAPRES) 0.3 MG tablet Take 1 tablet (0.3 mg total) by mouth 2 (two) times daily. 06/08/20   Posey Boyer, MD  Continuous Blood Gluc Receiver (FREESTYLE LIBRE 14 DAY READER) DEVI Use as directed. 04/18/20   Horald Pollen, MD  diltiazem (CARDIZEM) 30 MG tablet Take one tablet every 4 hours as needed for heart rate greater than 100 and blood pressure needs to be above 100 Patient taking differently: Take 30 mg by mouth every 4 (four) hours as needed (heart rate greater than 100 and blood pressure needs to be above 100).  05/09/20   Sherran Needs, NP  Insulin Lispro Prot & Lispro (HUMALOG 75/25 MIX) (75-25) 100 UNIT/ML Kwikpen Inject 15-21 Units into the skin with breakfast, with lunch, and with  evening meal. Sliding scale insulin 08/12/16   [provider]  levothyroxine (SYNTHROID, LEVOTHROID) 75 MCG tablet Take 75 mcg daily before breakfast by mouth.  12/29/16   [provider]  loperamide (IMODIUM) 2 MG capsule Take 1 capsule (2 mg total) by mouth 4 (four) times daily as needed for diarrhea or loose stools. Patient not taking: Reported on 07/06/2020 03/20/20   Garald Balding, PA-C  losartan (COZAAR) 100 MG tablet Take 1 tablet (100 mg total) by mouth daily. 05/31/20   Horald Pollen, MD  NOVOFINE PLUS 32G X 4 MM MISC 1 each by Subdermal route 2 (two) times daily. as directed 03/30/20   Maximiano Coss, NP  potassium chloride (KLOR-CON) 10 MEQ tablet Take 2 tablets (20 mEq total) by mouth daily. 10/18/19   [name]  C, MD  RESTASIS MULTIDOSE 0.05 % ophthalmic emulsion Place 1 drop into both eyes 2 (two) times daily.  03/04/20   [provider]  rosuvastatin (CRESTOR) 10 MG tablet Take 10 mg by mouth every evening.    [provider]  Semaglutide,0.25 or 0.5MG/DOS, (OZEMPIC, 0.25 OR 0.5 MG/DOSE,) 2 MG/1.5ML SOPN Inject 0.75 mLs (1 mg total) into the skin once a week. 04/18/20   Horald Pollen, MD  torsemide (DEMADEX) 20 MG tablet Take 2 tablets (40 mg total) by mouth daily. 09/29/19   [name], MD    Allergies    Metformin  Review of Systems   Review of Systems  Constitutional: Negative for chills and fever.  Eyes: Negative for visual disturbance.  Respiratory: Negative for cough and shortness of breath.   Cardiovascular: Negative for chest pain and leg swelling.  Gastrointestinal: Negative for abdominal pain, constipation, diarrhea, nausea and vomiting.  Genitourinary: Negative for dysuria.  Musculoskeletal: Positive for arthralgias, back pain and neck pain.  Neurological: Negative for dizziness, seizures, syncope, speech difficulty, weakness, numbness and headaches.  All other systems reviewed and are negative.   Physical  Exam Updated Vital Signs BP (!) 142/57 (BP Location: Left Wrist)   Pulse 63   Temp 98.2 F (36.8 C) (Oral)   Resp 18   Ht '5\' 1"'  (1.549 m)   Wt 114.3 kg   SpO2 99%   BMI 47.61 kg/m   Physical Exam Vitals and nursing note reviewed.  Constitutional:      General: She is not in acute distress.    Appearance: She is well-developed. She is not toxic-appearing.  HENT:     Head: Normocephalic and atraumatic.     Comments: No raccoon eyes or battle sign.    Ears:     Comments: No hemotympanum.    Mouth/Throat:     Pharynx: Oropharynx is clear.     Comments: Uvula midline. Eyes:     General:        Right eye: No discharge.        Left eye: No discharge.     Extraocular Movements: Extraocular movements intact.     Conjunctiva/sclera: Conjunctivae normal.     Pupils: Pupils are equal, round, and reactive to light.  Neck:     Comments: No midline spinal tenderness.  Patient has right paraspinal muscle tenderness to palpation. Cardiovascular:     Rate and Rhythm: Normal rate.     Comments: 2+ symmetric DP pulses bilaterally. Pulmonary:     Effort: Pulmonary effort is normal. No respiratory distress.     Breath sounds: Normal breath sounds. No wheezing, rhonchi or rales.  Chest:     Chest wall: Tenderness (Right posterior chest wall/thoracic paraspinal muscles.,  Mild right lateral/anterior chest wall tenderness as well.) present.  Abdominal:     General: There is no distension.     Palpations: Abdomen is soft.     Tenderness: There is no abdominal tenderness. There is no guarding or rebound.  Musculoskeletal:     Cervical back: Neck supple.     Comments: Back: No midline tenderness to palpation.  Right thoracic paraspinal muscle tenderness noted. Upper extremities: No focal bony tenderness Lower extremities: Mild tenderness to left anterior knee. Trace symmetric BLE edema to lower legs. No calf tenderness. Neurovascularly intact distally.  Skin:    General: Skin is warm and  dry.     Findings: No rash.  Neurological:     Mental Status: She is alert.  Comments: Clear speech.  CN II through XII grossly intact sensation grossly intact bilateral upper and lower extremities.  5-5 symmetric grip strength.  5-5 strength with plantar dorsiflexion bilaterally.  Psychiatric:        Behavior: Behavior normal.    ED Results / Procedures / Treatments   Labs (all labs ordered are listed, but only abnormal results are displayed) Labs Reviewed  CBC - Abnormal; Notable for the following components:      Result Value   WBC 15.0 (*)    RDW 16.2 (*)    All other components within normal limits  COMPREHENSIVE METABOLIC PANEL    EKG None  Radiology DG Chest 2 View  Result Date: 07/08/2020 CLINICAL DATA:  Back pain. Right upper quadrant pain that worsens with on inspiration. EXAM: CHEST - 2 VIEW COMPARISON:  05/01/2020 FINDINGS: Mild cardiac enlargement. Aortic atherosclerotic calcifications. Mild diffuse hazy lung opacities identified. Visualized osseous structures appear intact. IMPRESSION: Suspect mild CHF. Electronically Signed   By: Kerby Moors M.D.   On: 07/08/2020 06:29    Procedures Procedures (including critical care time)  Medications Ordered in ED Medications  lidocaine (LIDODERM) 5 % 1 patch (has no administration in time range)  fentaNYL (SUBLIMAZE) injection 50 mcg (has no administration in time range)    ED Course  I have reviewed the triage vital signs and the nursing notes.  Pertinent labs & imaging results that were available during my care of the patient were reviewed by me and considered in my medical decision making (see chart for details).    MDM Rules/Calculators/A&P                         Patient presents to the emergency department with complaints of right-sided neck/back pain over the past 4 to 5 days.  Patient is nontoxic, resting comfortably, her vitals are without significant abnormality, her BP is mildly elevated, low  suspicion for hypertensive emergency. Her pain is all reproducible with right sided thoracic/cervical paraspinal muscle palpation, also has some tenderness to the right lateral/anterior chest. Abdomen is without significant tenderness or peritoneal signs. She has no midline tenderness to raise concern for spinal fx, she has no focal neuro deficits to suggest cord compression. Afebrile, no hx of IVDU- doubt epidural abscess. Compliant with anticoagulation without tachycardia or significant hypoxia therefore doubt PE. Plan for basic labs & CXR. High suspicion for MSK pain, fentanyl & lidoderm patch ordered.  In regard to her left knee- had x-ray by PCP that showed degenerative changes, no fx/dislocation- NVI distally, not consistent w/ septic joint, has follow up with orthopedics scheduled for tomorrow.   CBC: Leukocytosis felt to be nonspecific.   CXR with findings of mild CHF per radiology, appears somewhat similar to prior CXRs on review, she has only trace peripheral edema, no dyspnea, only having pain to R side, does not seem like acute CHF exacerbation clinically. No findings of rib fx or pneumothorax.   Patient care signed out to Carmon Sails PA-C at change of shift pending CMP, if no significant abnormalities or clinical change anticipate discharge home.   This is a shared visit with supervising physician Dr. Christy Gentles who has independently evaluated patient & provided guidance in evaluation/management/disposition, in agreement with care   Final Clinical Impression(s) / ED Diagnoses Final diagnoses:  Upper back pain    Rx / DC Orders ED Discharge Orders         Ordered    lidocaine ([place])  5 %  Daily PRN        07/08/20 0610    acetaminophen (TYLENOL 8 HOUR) 650 MG CR tablet  Every 8 hours PRN        07/08/20 0610           Amaryllis Dyke, PA-C 07/08/20 0037    Ripley Fraise, MD 07/08/20 [phone]

## 2020-07-08 NOTE — Discharge Instructions (Addendum)
You were seen in the emergency department today for back/neck pain.  Your labs and chest x-ray were overall reassuring.  We suspect your pain may be muscular in nature.  We are sending home with Lidoderm patches and Tylenol.  Please apply 1 Lidoderm patch to your area with significant pain once per day and take Tylenol every 8 hours as needed for discomfort.  Additionally, take oxycodone 5 mg sparingly and only for severe or break through pain.  This medication can cause drowsiness, caution with walking, transferring as it can lead to falls.  Flexeril 5 mg every 8 hours for spasms, tightness   We have prescribed you new medication(s) today. Discuss the medications prescribed today with your pharmacist as they can have adverse effects and interactions with your other medicines including over the counter and prescribed medications. Seek medical evaluation if you start to experience new or abnormal symptoms after taking one of these medicines, seek care immediately if you start to experience difficulty breathing, feeling of your throat closing, facial swelling, or rash as these could be indications of a more serious allergic reaction.  Please apply heat to your areas of pain.   Please follow-up with your primary care provider within 3 days as well as orthopedics as scheduled for a recheck of your discomfort.  Return to the emergency department for new or worsening symptoms including but not limited to worsening pain, chest pain, trouble breathing, fever, coughing, passing out, abdominal pain, vomiting, blood in stool/urine, or any other concerns.

## 2020-07-08 NOTE — ED Provider Notes (Addendum)
Physical Exam  BP 140/77   Pulse (!) 115   Temp 98.2 F (36.8 C) (Oral)   Resp 20   Ht 5\' 1"  (1.549 m)   Wt 114.3 kg   SpO2 98%   BMI 47.61 kg/m   Physical Exam Cardiovascular:     Comments: HR 99-120 fluctuating, in atrial fibrillation on monitor     ED Course/Procedures   Clinical Course as of Jul 09 915  Sun Jul 08, 2020  0637 Right back and right sided neck pain. Worse with turn, twists heads.    CXR shows mild CHF. Known CHF. No respiratory complains or chest pain. No urinary symptoms.  On [place].   No abdominal tenderness.  Exam reproducible tenderness.   Suspect MSK etiology. Doubt PE, GU, PNA   Pending basic labs. Anticipate discharge home.   Has ortho appointment tomorrow.    [CG]    Clinical Course User Index [CG] Kinnie Feil, PA-C    Procedures  MDM   Patient signed out to me by previous ED PA at shift change pending CMP.  Anticipate discharge with treatment of likely soft tissue/MSK pain.  CMP is unremarkable.  RN notified me patient had HR 135.  I evaluated patient who is having heart rate fluctuating from 100-1 25.  She tells me that she has diltiazem 30 mg she is supposed to take every 4 hours as needed if her heart rate is greater than 100.  States her heart rate has not been greater than 100 for the last week and so she has not taken it.  Denies any chest pain, shortness of breath.  Lungs clear.  Hemodynamically stable.    She was given her oral dose of Cardizem with improvement in heart rate in the 100s-115 range.  She has no symptoms from this.  In setting of known atrial fibrillation.  She is anticoagulated.  We will discharge at this time.  Return precautions given.   Kinnie Feil, PA-C 07/08/20 1610  1005: RN notified me patient requesting to speak to me.  Patient reports she feels worse than when she came in. Pain is in right head, right neck, thoracic back. Constant 10/10. Worse with moving, breathing. Patient also concerned  because her heart rate is now 120-130s and this was not the case when she came in.  She is teary eyed. States she doesn't want to be a bother but is still worried. Denies CP, SOB, cough. Denies extremity numbness weakness coolness. States she has been using a walker for the last 1 week and thinks may that is the problem.    On exam HR in 120-130s. Last documented BP 91/76.  Patient anxious appearing, teary eyed and worried because her pain is worse. Non toxic. Reproducible pain in right neck, thoracic back, worse with head movement and trunk movements. 1+ radial pulses and neuro intact distally. Lungs clear.  Clinically appears MSK in etiology.    Given clinical status change will expand work up.   Will repeat fentanyl obtain EKG, trop x 1, reassess.   1215: EKG shows sinus rhythm heart rate 73.  Troponin undetectable.  Patient feels better.  Explained to her paroxysmal A. fib will cause her to have fluctuating heart rate.  She remains asymptomatic from this.  Suspect patient's neck, head and back pain are musculoskeletal in nature and not related to her paroxysmal A. fib here.  She is fully anticoagulated and has not missed any doses.  She is not hypoxic or short  of breath.  Doubt PE.  No distal neuro pulse deficits doubt vascular issue.  She has been using a walker for 1 week and suspect this is contributing to her pain.  She has follow-up with cardiology for cardioversion.  Appropriate for discharge.  Discussed with EDP ray.    Kinnie Feil, PA-C 07/08/20 1215    Pattricia Boss, MD 07/08/20 1258

## 2020-07-09 ENCOUNTER — Telehealth: Payer: Self-pay

## 2020-07-09 ENCOUNTER — Ambulatory Visit: Payer: Medicare Other | Admitting: Orthopaedic Surgery

## 2020-07-09 NOTE — Telephone Encounter (Signed)
Pt is aware and agreeable to stable labs Pt states she was in the ED for 16 hours on Saturday for back pain and she feels like she went back into Afib.  She was concerned as the whether the pain medication she was prescribed was contraindicated with her Diltiazem 30 mg that she takes for breakthrough I instructed the patient she can still take her breakthrough medication. She is scheduled to be cardioverted on 9/24 and is still agreeable to the procedure.

## 2020-07-09 NOTE — Telephone Encounter (Signed)
-----   Message from Patsey Berthold, NP sent at 07/06/2020  6:40 AM EDT ----- Please notify patient of stable labs. Thanks!

## 2020-07-10 ENCOUNTER — Other Ambulatory Visit (HOSPITAL_COMMUNITY)
Admission: RE | Admit: 2020-07-10 | Discharge: 2020-07-10 | Disposition: A | Discharge status: Home / Self Care | Payer: Medicare Other | Source: Ambulatory Visit | Attending: Cardiology | Admitting: Cardiology

## 2020-07-10 DIAGNOSIS — Z20822 Contact with and (suspected) exposure to covid-19: Secondary | ICD-10-CM | POA: Diagnosis not present

## 2020-07-10 DIAGNOSIS — Z01812 Encounter for preprocedural laboratory examination: Secondary | ICD-10-CM | POA: Diagnosis not present

## 2020-07-10 LAB — SARS CORONAVIRUS 2 (TAT 6-24 HRS): SARS Coronavirus 2: NEGATIVE

## 2020-07-11 ENCOUNTER — Ambulatory Visit (INDEPENDENT_AMBULATORY_CARE_PROVIDER_SITE_OTHER): Payer: Medicare Other | Admitting: Emergency Medicine

## 2020-07-11 ENCOUNTER — Other Ambulatory Visit: Payer: Self-pay

## 2020-07-11 ENCOUNTER — Encounter: Payer: Self-pay | Admitting: Emergency Medicine

## 2020-07-11 VITALS — BP 149/66 | HR 69 | Temp 98.4°F | Ht 60.0 in | Wt 256.2 lb

## 2020-07-11 DIAGNOSIS — M7918 Myalgia, other site: Secondary | ICD-10-CM

## 2020-07-11 DIAGNOSIS — T07XXXA Unspecified multiple injuries, initial encounter: Secondary | ICD-10-CM | POA: Diagnosis not present

## 2020-07-11 DIAGNOSIS — R296 Repeated falls: Secondary | ICD-10-CM

## 2020-07-11 MED ORDER — TRAMADOL HCL 50 MG PO TABS: 50.0000 mg | ORAL_TABLET | Freq: Three times a day (TID) | ORAL | 0 refills | Status: DC | PRN

## 2020-07-11 NOTE — Progress Notes (Signed)
dm

## 2020-07-11 NOTE — Progress Notes (Signed)
[name] 79 y.o.   Chief Complaint  Patient presents with  . Hospitalization Follow-up  . left hip pain    yesterday    HISTORY OF PRESENT ILLNESS: This is a 79 y.o. female complaining of pain to her right side from a fall sustained couple weeks ago. Recently went to emergency department on 07/08/2020 and had normal work-up.  Discharged on oxycodone 5 mg tablets which are not really helping her.  Mostly pain to her right shoulder and right upper and lower back.  No other symptoms. No other complaints or medical concerns today.  Has appointment to see orthopedist tomorrow for left knee problems. Has appointment to see cardiologist on Friday for cardioversion.  Has history of chronic A. fib on long-term anticoagulation treatment.  HPI   Prior to Admission medications   Medication Sig Start Date End Date Taking? Authorizing Provider  Accu-Chek Softclix Lancets lancets 1 each by Other route 3 (three) times daily. as directed 03/30/20  Yes Maximiano Coss, NP  acetaminophen (TYLENOL 8 HOUR) 650 MG CR tablet Take 1 tablet (650 mg total) by mouth every 8 (eight) hours as needed for pain. 07/08/20  Yes Petrucelli, Samantha R, PA-C  allopurinol (ZYLOPRIM) 300 MG tablet Take 300 mg by mouth daily.  07/07/13  Yes [provider]  amiodarone (PACERONE) 200 MG tablet TAKE 1 TABLET BY MOUTH EVERY DAY. [place] Patient taking differently: Take 400 mg by mouth every Monday, Tuesday, Wednesday, Thursday, and Friday. TAKE 1 TABLET BY MOUTH EVERY DAY. [place] ONLY 03/16/20  Yes [name], MD  amLODipine (NORVASC) 5 MG tablet Take 1 tablet (5 mg total) by mouth daily. 06/11/20  Yes Lacresha Fusilier, Ines Bloomer, MD  apixaban (ELIQUIS) 5 MG TABS tablet Take 1 tablet (5 mg total) by mouth 2 (two) times daily. 06/14/20  Yes [name], MD  blood glucose meter kit and supplies Dispense based on patient and insurance preference. Use up to four times daily as directed.  (FOR ICD-10 E10.9, E11.9). 05/03/20  Yes Janett Kamath, Ines Bloomer, MD  cloNIDine (CATAPRES) 0.3 MG tablet Take 1 tablet (0.3 mg total) by mouth 2 (two) times daily. 06/08/20  Yes Posey Boyer, MD  Continuous Blood Gluc Receiver (FREESTYLE LIBRE 14 DAY READER) DEVI Use as directed. 04/18/20  Yes Kyndal Heringer, Ines Bloomer, MD  cyclobenzaprine (FLEXERIL) 5 MG tablet Take 1 tablet (5 mg total) by mouth 3 (three) times daily for 7 days. 07/08/20 07/15/20 Yes Kinnie Feil, PA-C  diltiazem (CARDIZEM) 30 MG tablet Take one tablet every 4 hours as needed for heart rate greater than 100 and blood pressure needs to be above 100 Patient taking differently: Take 30 mg by mouth every 4 (four) hours as needed (heart rate greater than 100 and blood pressure needs to be above 100).  05/09/20  Yes Sherran Needs, NP  Insulin Lispro Prot & Lispro (HUMALOG 75/25 MIX) (75-25) 100 UNIT/ML Kwikpen Inject 15-21 Units into the skin with breakfast, with lunch, and with evening meal. Sliding scale insulin 08/12/16  Yes [provider]  levothyroxine (SYNTHROID, LEVOTHROID) 75 MCG tablet Take 75 mcg daily before breakfast by mouth.  12/29/16  Yes [provider]  lidocaine (LIDODERM) 5 % Place 1 patch onto the skin daily as needed. Apply patch to area most significant pain once per day.  Remove and discard patch within 12 hours of application. 07/08/20  Yes Petrucelli, Samantha R, PA-C  loperamide (IMODIUM) 2 MG capsule Take 1 capsule (  2 mg total) by mouth 4 (four) times daily as needed for diarrhea or loose stools. 03/20/20  Yes Garald Balding, PA-C  losartan (COZAAR) 100 MG tablet Take 1 tablet (100 mg total) by mouth daily. 05/31/20  Yes Tavarius Grewe, Ines Bloomer, MD  NOVOFINE PLUS 32G X 4 MM MISC 1 each by Subdermal route 2 (two) times daily. as directed 03/30/20  Yes Maximiano Coss, NP  oxyCODONE (OXY IR/ROXICODONE) 5 MG immediate release tablet Take 1 tablet (5 mg total) by mouth every 6 (six) hours as needed for up  to 3 days for severe pain. 07/08/20 07/11/20 Yes Kinnie Feil, PA-C  potassium chloride (KLOR-CON) 10 MEQ tablet Take 2 tablets (20 mEq total) by mouth daily. 10/18/19  Yes [name], MD  RESTASIS MULTIDOSE 0.05 % ophthalmic emulsion Place 1 drop into both eyes 2 (two) times daily.  03/04/20  Yes [provider]  rosuvastatin (CRESTOR) 10 MG tablet Take 10 mg by mouth every evening.   Yes [provider]  Semaglutide,0.25 or 0.5MG/DOS, (OZEMPIC, 0.25 OR 0.5 MG/DOSE,) 2 MG/1.5ML SOPN Inject 0.75 mLs (1 mg total) into the skin once a week. 04/18/20  Yes Jerrika Ledlow, Ines Bloomer, MD  torsemide (DEMADEX) 20 MG tablet Take 2 tablets (40 mg total) by mouth daily. 09/29/19  Yes [name], MD  traMADol (ULTRAM) 50 MG tablet Take 50 mg by mouth in the morning, at noon, and at bedtime.    Yes [provider]  traMADol (ULTRAM) 50 MG tablet Take 1 tablet (50 mg total) by mouth every 8 (eight) hours as needed. 07/11/20   Horald Pollen, MD    Allergies  Allergen Reactions  . Metformin Diarrhea    Patient Active Problem List   Diagnosis Date Noted  . Type 2 diabetes mellitus with hyperglycemia, with long-term current use of insulin ([place]) 06/21/2020  . Type 2 diabetes mellitus with diabetic polyneuropathy, with long-term current use of insulin ([place]) 06/21/2020  . Mixed hyperlipidemia 06/21/2020  . Uncontrolled hypertension 06/11/2020  . Aortic atherosclerosis ([place]) 03/28/2020  . Diabetic peripheral neuropathy ([place]) 12/26/2019  . Obesity 12/19/2015  . Hypersomnia 12/19/2015  . Acute diastolic congestive heart failure, NYHA class 3 ([place]) 10/03/2013  . Prolonged QT interval 10/03/2013  . Acute diastolic heart failure ([place]) 10/03/2013  . Long term (current) use of anticoagulants 06/09/2012  . BENIGN NEOPLASM OF ADRENAL GLAND 11/25/2010  . Chronic diastolic heart failure ([place]) 02/20/2010  . DM 05/16/2009  . GOUT 05/16/2009  . OBESITY, MORBID 05/16/2009  .  Hypertension associated with diabetes ([employer]) 05/16/2009  . Paroxysmal atrial fibrillation ([place]) 05/16/2009  . HYPERLIPIDEMIA 11/30/2008  . Obstructive sleep apnea on CPAP 11/30/2008    Past Medical History:  Diagnosis Date  . Arthritis   . Back pain   . Chronic anticoagulation    due to aflutter  . Chronic kidney disease   . Diabetes mellitus   . Diastolic CHF, chronic ([place])    a.  echo 2006 - ef 55-65%; mild diast dysfxn;    b. Echo 08/2011: Mild LVH, EF 60%;  c. 04/2013 Echo: EF 65-69%, mild conc LVH;  08/2014 Echo: EF 60-65%, mild-mod MR.  . Gout   . Hyperlipidemia   . Hypertension    a.  Renal arterial Dopplers 12/2011: 1-59% right renal artery stenosis  . Morbid obesity ([place])   . Obstructive sleep apnea on CPAP   . Paroxysmal Afib/Flutter    a. 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    Past  Surgical History:  Procedure Laterality Date  . APPENDECTOMY    . ATRIAL FLUTTER ABLATION N/A 09/24/2011   Procedure: ATRIAL FLUTTER ABLATION;  Surgeon: [name], MD;  Location: [facility] CATH LAB;  Service: Cardiovascular;  Laterality: N/A;  . CARDIOVERSION  10/22/2011   Procedure: CARDIOVERSION;  Surgeon: [name], MD;  Location: [place];  Service: Cardiovascular;  Laterality: N/A;  . CARDIOVERSION N/A 09/10/2011   Procedure: CARDIOVERSION;  Surgeon: [name], MD;  Location[facility] CATH LAB;  Service: Cardiovascular;  Laterality: N/A;  . CHOLECYSTECTOMY    . TONSILLECTOMY  1982  . TOTAL ABDOMINAL HYSTERECTOMY      Social History   Socioeconomic History  . Marital status: Married    Spouse name: Not on file  . Number of children: 3  . Years of education: Not on file  . Highest education level: Not on file  Occupational History  . Occupation: DISABILITY/housewife    Employer: RETIRED  Tobacco Use  . Smoking status: Never Smoker  . Smokeless tobacco: Never Used  Vaping Use  . Vaping Use: Never used  Substance and Sexual Activity  . Alcohol use: No  . Drug use: No  .  Sexual activity: Yes  Other Topics Concern  . Not on file  Social History Narrative  . Not on file   Social Determinants of Health   Financial Resource Strain:   . Difficulty of Paying Living Expenses: Not on file  Food Insecurity:   . Worried About Charity fundraiser in the Last Year: Not on file  . Ran Out of Food in the Last Year: Not on file  Transportation Needs:   . Lack of Transportation (Medical): Not on file  . Lack of Transportation (Non-Medical): Not on file  Physical Activity:   . Days of Exercise per Week: Not on file  . Minutes of Exercise per Session: Not on file  Stress:   . Feeling of Stress : Not on file  Social Connections:   . Frequency of Communication with Friends and Family: Not on file  . Frequency of Social Gatherings with Friends and Family: Not on file  . Attends Religious Services: Not on file  . Active Member of Clubs or Organizations: Not on file  . Attends Archivist Meetings: Not on file  . Marital Status: Not on file  Intimate Partner Violence:   . Fear of Current or Ex-Partner: Not on file  . Emotionally Abused: Not on file  . Physically Abused: Not on file  . Sexually Abused: Not on file    Family History  Problem Relation Age of Onset  . Heart disease Father   . Hypertension Father   . Breast cancer Sister   . Cancer Sister        breast     Review of Systems  Constitutional: Negative.  Negative for fever.  HENT: Negative.  Negative for congestion and sore throat.   Respiratory: Negative.  Negative for cough and shortness of breath.   Cardiovascular: Positive for palpitations. Negative for chest pain.  Gastrointestinal: Negative for abdominal pain, diarrhea, nausea and vomiting.  Genitourinary: Negative.  Negative for dysuria and hematuria.  Musculoskeletal: Positive for back pain and joint pain.  Skin: Negative.  Negative for rash.  Neurological: Negative.  Negative for dizziness and headaches.  All other systems  reviewed and are negative.  Today's Vitals   07/11/20 1552  BP: (!) 149/66  Pulse: 69  Temp: 98.4 F (36.9 C)  TempSrc: Temporal  SpO2: 94%  Weight: 256 lb 3.2 oz (116.2 kg)  Height: 5' (1.524 m)   Body mass index is 50.04 kg/m.   Physical Exam Vitals reviewed.  Constitutional:      Appearance: Normal appearance. She is obese.  HENT:     Head: Normocephalic.  Eyes:     Extraocular Movements: Extraocular movements intact.     Pupils: Pupils are equal, round, and reactive to light.  Cardiovascular:     Rate and Rhythm: Normal rate. Rhythm irregular.  Pulmonary:     Effort: Pulmonary effort is normal.     Breath sounds: Normal breath sounds.  Musculoskeletal:        General: No swelling, tenderness, deformity or signs of injury.     Cervical back: Neck supple. No bony tenderness. Decreased range of motion.     Thoracic back: No bony tenderness. Decreased range of motion.     Lumbar back: No bony tenderness. Decreased range of motion.  Skin:    General: Skin is warm and dry.  Neurological:     General: No focal deficit present.     Mental Status: She is alert and oriented to person, place, and time.  Psychiatric:        Mood and Affect: Mood normal.        Behavior: Behavior normal.      ASSESSMENT & PLAN: [name] was seen today for hospitalization follow-up and left hip pain.  Diagnoses and all orders for this visit:  Musculoskeletal pain -     traMADol (ULTRAM) 50 MG tablet; Take 1 tablet (50 mg total) by mouth every 8 (eight) hours as needed.  Multiple contusions    Patient Instructions       If you have lab work done today you will be contacted with your lab results within the next 2 weeks.  If you have not heard from [place] then please contact us. The fastest way to get your results is to register for My Chart.   IF you received an x-ray today, you will receive an invoice from [facility]. Please contact [facility] at [phone]  with questions or concerns regarding your invoice.   IF you received labwork today, you will receive an invoice from [place]. Please contact [facility] at [phone] with questions or concerns regarding your invoice.   Our billing staff will not be able to assist you with questions regarding bills from these companies.  You will be contacted with the lab results as soon as they are available. The fastest way to get your results is to activate your My Chart account. Instructions are located on the last page of this paperwork. If you have not heard from [place] regarding the results in 2 weeks, please contact this office.      Musculoskeletal Pain Musculoskeletal pain refers to aches and pains in your bones, joints, muscles, and the tissues that surround them. This pain can occur in any part of the body. It can last for a short time (acute) or a long time (chronic). A physical exam, lab tests, and imaging studies may be done to find the cause of your musculoskeletal pain. Follow these instructions at home:  Lifestyle  Try to control or lower your stress levels. Stress increases muscle tension and can worsen musculoskeletal pain. It is important to recognize when you are anxious or stressed and learn ways to manage it. This may include: ? Meditation or yoga. ? Cognitive or behavioral therapy. ? Acupuncture or massage  therapy.  You may continue all activities unless the activities cause more pain. When the pain gets better, slowly resume your normal activities. Gradually increase the intensity and duration of your activities or exercise. Managing pain, stiffness, and swelling  Take over-the-counter and prescription medicines only as told by your health care provider.  When your pain is severe, bed rest may be helpful. Lie or sit in any position that is comfortable, but get out of bed and walk around at least every couple of hours.  If directed, apply heat to the affected area as often as told  by your health care provider. Use the heat source that your health care provider recommends, such as a moist heat pack or a heating pad. ? Place a towel between your skin and the heat source. ? Leave the heat on for 20-30 minutes. ? Remove the heat if your skin turns bright red. This is especially important if you are unable to feel pain, heat, or cold. You may have a greater risk of getting burned.  If directed, put ice on the painful area. ? Put ice in a plastic bag. ? Place a towel between your skin and the bag. ? Leave the ice on for 20 minutes, 2-3 times a day. General instructions  Your health care provider may recommend that you see a physical therapist. This person can help you come up with a safe exercise program. Do any exercises as told by your physical therapist.  Keep all follow-up visits, including any physical therapy visits, as told by your health care providers. This is important. Contact a health care provider if:  Your pain gets worse.  Medicines do not help ease your pain.  You cannot use the part of your body that hurts, such as your arm, leg, or neck.  You have trouble sleeping.  You have trouble doing your normal activities. Get help right away if:  You have a new injury and your pain is worse or different.  You feel numb or you have tingling in the painful area. Summary  Musculoskeletal pain refers to aches and pains in your bones, joints, muscles, and the tissues that surround them.  This pain can occur in any part of the body.  Your health care provider may recommend that you see a physical therapist. This person can help you come up with a safe exercise program. Do any exercises as told by your physical therapist.  Lower your stress level. Stress can worsen musculoskeletal pain. Ways to lower stress may include meditation, yoga, cognitive or behavioral therapy, acupuncture, and massage therapy. This information is not intended to replace advice given  to you by your health care provider. Make sure you discuss any questions you have with your health care provider. Document Revised: 09/18/2017 Document Reviewed: 11/05/2016 Elsevier Patient Education  2020 Elsevier Inc.      Agustina Caroli, MD [facility]

## 2020-07-11 NOTE — Patient Instructions (Addendum)
If you have lab work done today you will be contacted with your lab results within the next 2 weeks.  If you have not heard from [place] then please contact us. The fastest way to get your results is to register for My Chart.   IF you received an x-ray today, you will receive an invoice from [facility] Radiology. Please contact [facility] Radiology at [phone] with questions or concerns regarding your invoice.   IF you received labwork today, you will receive an invoice from [place]. Please contact [facility] at [phone] with questions or concerns regarding your invoice.   Our billing staff will not be able to assist you with questions regarding bills from these companies.  You will be contacted with the lab results as soon as they are available. The fastest way to get your results is to activate your My Chart account. Instructions are located on the last page of this paperwork. If you have not heard from [place] regarding the results in 2 weeks, please contact this office.      Musculoskeletal Pain Musculoskeletal pain refers to aches and pains in your bones, joints, muscles, and the tissues that surround them. This pain can occur in any part of the body. It can last for a short time (acute) or a long time (chronic). A physical exam, lab tests, and imaging studies may be done to find the cause of your musculoskeletal pain. Follow these instructions at home:  Lifestyle  Try to control or lower your stress levels. Stress increases muscle tension and can worsen musculoskeletal pain. It is important to recognize when you are anxious or stressed and learn ways to manage it. This may include: ? Meditation or yoga. ? Cognitive or behavioral therapy. ? Acupuncture or massage therapy.  You may continue all activities unless the activities cause more pain. When the pain gets better, slowly resume your normal activities. Gradually increase the intensity and duration of your activities or  exercise. Managing pain, stiffness, and swelling  Take over-the-counter and prescription medicines only as told by your health care provider.  When your pain is severe, bed rest may be helpful. Lie or sit in any position that is comfortable, but get out of bed and walk around at least every couple of hours.  If directed, apply heat to the affected area as often as told by your health care provider. Use the heat source that your health care provider recommends, such as a moist heat pack or a heating pad. ? Place a towel between your skin and the heat source. ? Leave the heat on for 20-30 minutes. ? Remove the heat if your skin turns bright red. This is especially important if you are unable to feel pain, heat, or cold. You may have a greater risk of getting burned.  If directed, put ice on the painful area. ? Put ice in a plastic bag. ? Place a towel between your skin and the bag. ? Leave the ice on for 20 minutes, 2-3 times a day. General instructions  Your health care provider may recommend that you see a physical therapist. This person can help you come up with a safe exercise program. Do any exercises as told by your physical therapist.  Keep all follow-up visits, including any physical therapy visits, as told by your health care providers. This is important. Contact a health care provider if:  Your pain gets worse.  Medicines do not help ease your pain.  You cannot use the part  of your body that hurts, such as your arm, leg, or neck.  You have trouble sleeping.  You have trouble doing your normal activities. Get help right away if:  You have a new injury and your pain is worse or different.  You feel numb or you have tingling in the painful area. Summary  Musculoskeletal pain refers to aches and pains in your bones, joints, muscles, and the tissues that surround them.  This pain can occur in any part of the body.  Your health care provider may recommend that you see a  physical therapist. This person can help you come up with a safe exercise program. Do any exercises as told by your physical therapist.  Lower your stress level. Stress can worsen musculoskeletal pain. Ways to lower stress may include meditation, yoga, cognitive or behavioral therapy, acupuncture, and massage therapy. This information is not intended to replace advice given to you by your health care provider. Make sure you discuss any questions you have with your health care provider. Document Revised: 09/18/2017 Document Reviewed: 11/05/2016 Elsevier Patient Education  2020 [employer].

## 2020-07-12 ENCOUNTER — Telehealth: Payer: Self-pay | Admitting: Nurse Practitioner

## 2020-07-12 ENCOUNTER — Telehealth: Payer: Self-pay | Admitting: Emergency Medicine

## 2020-07-12 NOTE — Telephone Encounter (Signed)
I called [facility]([phone]) their computer is down. But I left the number for them to call back so we can get a patient set up to get roller walker and a bedside commode. Order has been faxed as well to [phone] number was provided by [facility]. Order has been place in [place] box until we hear back from [place]

## 2020-07-12 NOTE — Telephone Encounter (Signed)
New Message:     Please call, pt said she do not think she took her medicine

## 2020-07-12 NOTE — Telephone Encounter (Signed)
Patient called stating that she missed a dose of eliquis last night. When she remembered, she took it at 2:00 am this morning. Encouraged patient to take her morning dose now and take her evening dose at the same time as she normally would. Will send message to Dr. Gardiner Rhyme to see if her cardioversion needs to be rescheduled or does he want to get a TEE with cardioversion. Will forward to Dr. Gardiner Rhyme.

## 2020-07-12 NOTE — Telephone Encounter (Signed)
Called Dr. Gardiner Rhyme and informed him of patient taking her night dose of eliquis 4 hours late, and patient continuing on her regular scheduled dose this morning and will continue her nightly dose this evening. He stated that patient can keep her scheduled cardioversion for tomorrow and will not need to reschedule at this time. Informed patient of Dr. Newman Nickels response. Patient agreed to plan.

## 2020-07-13 ENCOUNTER — Ambulatory Visit (HOSPITAL_COMMUNITY): Payer: Medicare Other | Admitting: Anesthesiology

## 2020-07-13 ENCOUNTER — Encounter (HOSPITAL_COMMUNITY): Payer: Self-pay | Admitting: Cardiology

## 2020-07-13 ENCOUNTER — Encounter (HOSPITAL_COMMUNITY)
Admission: RE | Disposition: A | Discharge status: Home / Self Care | Payer: Self-pay | Source: Home / Self Care | Attending: Cardiology

## 2020-07-13 ENCOUNTER — Telehealth: Payer: Self-pay | Admitting: Emergency Medicine

## 2020-07-13 ENCOUNTER — Ambulatory Visit: Payer: Medicare Other | Admitting: Internal Medicine

## 2020-07-13 ENCOUNTER — Ambulatory Visit (HOSPITAL_COMMUNITY)
Admission: RE | Admit: 2020-07-13 | Discharge: 2020-07-13 | Disposition: A | Discharge status: Home / Self Care | Payer: Medicare Other | Attending: Cardiology | Admitting: Cardiology

## 2020-07-13 DIAGNOSIS — Z538 Procedure and treatment not carried out for other reasons: Secondary | ICD-10-CM | POA: Insufficient documentation

## 2020-07-13 DIAGNOSIS — I4891 Unspecified atrial fibrillation: Secondary | ICD-10-CM | POA: Insufficient documentation

## 2020-07-13 DIAGNOSIS — I440 Atrioventricular block, first degree: Secondary | ICD-10-CM | POA: Diagnosis not present

## 2020-07-13 SURGERY — CANCELLED PROCEDURE

## 2020-07-13 NOTE — Telephone Encounter (Signed)
Received a fax from after hours stating pt is scheduled for a cardioversion at 9am, she isn't suppose to eat or drink anything this morning and her glucose is 85. Please advise.

## 2020-07-13 NOTE — Telephone Encounter (Signed)
Pt has been in contact with her cardiology providers to resolve the scheduled cardiac procedure. Pt voices no other concerns at this time.

## 2020-07-13 NOTE — Telephone Encounter (Signed)
Received another after hours fax stating she is scheduled for a cardioversion at (am. She is having severe lower left hip pain, difficulty standing and wants to know if she can take a pain pill. Please advise.

## 2020-07-13 NOTE — Progress Notes (Signed)
Pt arrived to [facility] endoscopy in normal sinus rhythm. EKG Verified. Pt sinus brady with 1st degree heart block. Dr. Gardiner Rhyme is aware, talking to patient at bedside. Verbal orders to cancel procedure.

## 2020-07-13 NOTE — H&P (Signed)
Patient presented for cardioversion for Afib, found to be in sinus rhythm.  EKG shows sinus bradycardia, rate 54.  Cardioversion cancelled.  Donato Heinz, MD

## 2020-07-13 NOTE — Anesthesia Preprocedure Evaluation (Deleted)
Anesthesia Evaluation    Airway        Dental   Pulmonary sleep apnea and Continuous Positive Airway Pressure Ventilation ,           Cardiovascular hypertension, Pt. on medications +CHF  + dysrhythmias Atrial Fibrillation      Neuro/Psych negative psych ROS   GI/Hepatic negative GI ROS, Neg liver ROS,   Endo/Other  diabetes, Insulin Dependent  Renal/GU CRFRenal disease  negative genitourinary   Musculoskeletal  (+) Arthritis , Osteoarthritis,    Abdominal   Peds negative pediatric ROS (+)  Hematology negative hematology ROS (+)   Anesthesia Other Findings   Reproductive/Obstetrics negative OB ROS                            Anesthesia Physical Anesthesia Plan  ASA: III  Anesthesia Plan: General   Post-op Pain Management:    Induction: Intravenous  PONV Risk Score and Plan: 3  Airway Management Planned: Mask  Additional Equipment: None  Intra-op Plan:   Post-operative Plan:   Informed Consent: I have reviewed the patients History and Physical, chart, labs and discussed the procedure including the risks, benefits and alternatives for the proposed anesthesia with the patient or authorized representative who has indicated his/her understanding and acceptance.     Dental advisory given  Plan Discussed with: CRNA and Surgeon  Anesthesia Plan Comments: (Lab Results      Component                Value               Date                      WBC                      15.0 (H)            07/08/2020                HGB                      12.7                07/08/2020                HCT                      42.3                07/08/2020                MCV                      87.8                07/08/2020                PLT                      385                 07/08/2020           Covid-19 Nucleic Acid Test Results Lab Results      Component                Value  Date                      SARSCOV2NAA              NEGATIVE            07/10/2020          )        Anesthesia Quick Evaluation

## 2020-07-17 ENCOUNTER — Telehealth: Payer: Self-pay | Admitting: Internal Medicine

## 2020-07-17 NOTE — Telephone Encounter (Signed)
Spoke with pt who states she is back in Afib since 07/14/2020.  Denies current symptoms of SOB or dizziness.  Pt states she is taking Diltiazem 30mg  as prescribed but so far it has not helped. Appointment scheduled with Afib clinic, Ceasar Lund  for 07/18/2020 at [place].  Pt advised to continue medications as prescribed.  Reviewed ED precautions with pt.  Pt verbalizes understanding and agrees with current plan.

## 2020-07-17 NOTE — Telephone Encounter (Signed)
New Message  Olivia Mackie from [facility] is calling. She says the pt was scheduled for a Cardioversion on 24th but  cardiogram showed normal sinus rhythm  On Sept 25th pt started having Chest flutter again and still has it today HR Resting 111-132  Oxygen 95%  BP 107/87 132/77 No SOB or Dizziness  Olivia Mackie says pt was given Diltiazem 30mg  every 4 hrs as needed. Pt says she was told Not to take more then 3 pills without notifying the physician  Olivia Mackie is wondering What is the proper way to take medication? And when should she be taking it?   Please call patient and Olivia Mackie back to follow up with recommendations

## 2020-07-18 ENCOUNTER — Ambulatory Visit (INDEPENDENT_AMBULATORY_CARE_PROVIDER_SITE_OTHER): Payer: Medicare Other | Admitting: Orthopaedic Surgery

## 2020-07-18 ENCOUNTER — Encounter (HOSPITAL_COMMUNITY): Payer: Self-pay | Admitting: Nurse Practitioner

## 2020-07-18 ENCOUNTER — Encounter: Payer: Self-pay | Admitting: Orthopaedic Surgery

## 2020-07-18 ENCOUNTER — Other Ambulatory Visit: Payer: Self-pay

## 2020-07-18 ENCOUNTER — Telehealth: Payer: Self-pay | Admitting: *Deleted

## 2020-07-18 ENCOUNTER — Ambulatory Visit (HOSPITAL_COMMUNITY)
Admission: RE | Admit: 2020-07-18 | Discharge: 2020-07-18 | Disposition: A | Discharge status: Home / Self Care | Payer: Medicare Other | Source: Ambulatory Visit | Attending: Nurse Practitioner | Admitting: Nurse Practitioner

## 2020-07-18 VITALS — BP 138/62 | HR 58 | Ht 60.0 in | Wt 256.8 lb

## 2020-07-18 DIAGNOSIS — Z8249 Family history of ischemic heart disease and other diseases of the circulatory system: Secondary | ICD-10-CM | POA: Diagnosis not present

## 2020-07-18 DIAGNOSIS — N189 Chronic kidney disease, unspecified: Secondary | ICD-10-CM | POA: Diagnosis not present

## 2020-07-18 DIAGNOSIS — Z8616 Personal history of COVID-19: Secondary | ICD-10-CM | POA: Diagnosis not present

## 2020-07-18 DIAGNOSIS — E1122 Type 2 diabetes mellitus with diabetic chronic kidney disease: Secondary | ICD-10-CM | POA: Insufficient documentation

## 2020-07-18 DIAGNOSIS — M25562 Pain in left knee: Secondary | ICD-10-CM | POA: Diagnosis not present

## 2020-07-18 DIAGNOSIS — Z79890 Hormone replacement therapy (postmenopausal): Secondary | ICD-10-CM | POA: Diagnosis not present

## 2020-07-18 DIAGNOSIS — I480 Paroxysmal atrial fibrillation: Secondary | ICD-10-CM | POA: Diagnosis not present

## 2020-07-18 DIAGNOSIS — Z7901 Long term (current) use of anticoagulants: Secondary | ICD-10-CM | POA: Insufficient documentation

## 2020-07-18 DIAGNOSIS — G8929 Other chronic pain: Secondary | ICD-10-CM

## 2020-07-18 DIAGNOSIS — E785 Hyperlipidemia, unspecified: Secondary | ICD-10-CM | POA: Insufficient documentation

## 2020-07-18 DIAGNOSIS — Z79899 Other long term (current) drug therapy: Secondary | ICD-10-CM | POA: Diagnosis not present

## 2020-07-18 DIAGNOSIS — Z794 Long term (current) use of insulin: Secondary | ICD-10-CM | POA: Insufficient documentation

## 2020-07-18 DIAGNOSIS — D6869 Other thrombophilia: Secondary | ICD-10-CM

## 2020-07-18 DIAGNOSIS — I130 Hypertensive heart and chronic kidney disease with heart failure and stage 1 through stage 4 chronic kidney disease, or unspecified chronic kidney disease: Secondary | ICD-10-CM | POA: Diagnosis not present

## 2020-07-18 DIAGNOSIS — I5032 Chronic diastolic (congestive) heart failure: Secondary | ICD-10-CM | POA: Insufficient documentation

## 2020-07-18 DIAGNOSIS — G4733 Obstructive sleep apnea (adult) (pediatric): Secondary | ICD-10-CM | POA: Diagnosis not present

## 2020-07-18 DIAGNOSIS — Z888 Allergy status to other drugs, medicaments and biological substances status: Secondary | ICD-10-CM | POA: Diagnosis not present

## 2020-07-18 MED ORDER — DILTIAZEM HCL ER COATED BEADS 120 MG PO CP24: 120.0000 mg | ORAL_CAPSULE | Freq: Every day | ORAL | 3 refills | Status: DC

## 2020-07-18 MED ORDER — AMIODARONE HCL 200 MG PO TABS: 200.0000 mg | ORAL_TABLET | Freq: Every day | ORAL | 3 refills | Status: DC

## 2020-07-18 NOTE — Progress Notes (Signed)
Primary Care Physician: Horald Pollen, MD Referring Physician:[facility] triage   [name] is a 79 y.o. female with a h/o paroxysmal afib previously on flecainide, dofetilide and most recently on amiodarone. She is being seen today in the afib clinic. She is in SR but does report that she had a ER visit for afib that started around 4 am in the morning with HR's around 150 bpm. She  took her cardizem 120 mg at that time and again at 10  am. She  decided to go to the ER but by the time she was seen, she had returned to [place].   Overall her health has been stable. She had covid in January but she did not require hospitalization.  She remains on amiodarone 200 mg M-F only. TSH/Cmet checked in May  were in [place]. Marland Kitchen Remains on warfarin with CHA2DS2VASc score of at least 5. No bleeding issues. No further issues with afib.   F/u in afib clinic,07/18/20. She is here as she has had more  afib burden over the last month. SHe was initially seen by A. Lynnell Jude, NP, earlier in the month, amiodarone was increased to 200 mg bid M-Fand then set up for cardioversion. She  was in SR at time of cardioversion so it was cancelled. Then she noted more afib over the last 1-2 days. She was given an appointment here and presents today in [place]. She felt she may have converted late last night or early this am. No known trigger. She is not an ideal  ablation candidate 2/2 morbid obesity.   Today, she denies symptoms of palpitations, chest pain, shortness of breath, orthopnea, PND, lower extremity edema, dizziness, presyncope, syncope, or neurologic sequela. The patient is tolerating medications without difficulties and is otherwise without complaint today.   Past Medical History:  Diagnosis Date  . Arthritis   . Back pain   . Chronic anticoagulation    due to aflutter  . Chronic kidney disease   . Diabetes mellitus   . Diastolic CHF, chronic ([place])    a.  echo 2006 - ef 55-65%; mild diast dysfxn;    b. Echo 08/2011: Mild  LVH, EF 60%;  c. 04/2013 Echo: EF 65-69%, mild conc LVH;  08/2014 Echo: EF 60-65%, mild-mod MR.  . Gout   . Hyperlipidemia   . Hypertension    a.  Renal arterial Dopplers 12/2011: 1-59% right renal artery stenosis  . Morbid obesity ([place])   . Obstructive sleep apnea on CPAP   . Paroxysmal Afib/Flutter    a. 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   Past Surgical History:  Procedure Laterality Date  . APPENDECTOMY    . ATRIAL FLUTTER ABLATION N/A 09/24/2011   Procedure: ATRIAL FLUTTER ABLATION;  Surgeon: [name], MD;  Location: [facility] CATH LAB;  Service: Cardiovascular;  Laterality: N/A;  . CARDIOVERSION  10/22/2011   Procedure: CARDIOVERSION;  Surgeon: [name], MD;  Location: [place];  Service: Cardiovascular;  Laterality: N/A;  . CARDIOVERSION N/A 09/10/2011   Procedure: CARDIOVERSION;  Surgeon: [name], MD;  [facility] LAB;  Service: Cardiovascular;  Laterality: N/A;  . CHOLECYSTECTOMY    . TONSILLECTOMY  1982  . TOTAL ABDOMINAL HYSTERECTOMY      Current Outpatient Medications  Medication Sig Dispense Refill  . Accu-Chek Softclix Lancets lancets 1 each by Other route 3 (three) times daily. as directed 100 each 3  . allopurinol (ZYLOPRIM) 300 MG tablet Take 300 mg by mouth daily.     Marland Kitchen  amiodarone (PACERONE) 200 MG tablet Take 1 tablet (200 mg total) by mouth daily. 90 tablet 3  . apixaban (ELIQUIS) 5 MG TABS tablet Take 1 tablet (5 mg total) by mouth 2 (two) times daily. 180 tablet 1  . blood glucose meter kit and supplies Dispense based on patient and insurance preference. Use up to four times daily as directed. (FOR ICD-10 E10.9, E11.9). 1 each 0  . cloNIDine (CATAPRES) 0.3 MG tablet Take 1 tablet (0.3 mg total) by mouth 2 (two) times daily. 60 tablet 3  . Continuous Blood Gluc Receiver (FREESTYLE LIBRE 14 DAY READER) DEVI Use as directed. 1 each 0  . diltiazem (CARDIZEM) 30 MG tablet Take one tablet every 4 hours as needed for heart rate greater than 100 and blood  pressure needs to be above 100 (Patient taking differently: Take 30 mg by mouth every 4 (four) hours as needed (heart rate greater than 100 and blood pressure needs to be above 100). ) 30 tablet 6  . Insulin Lispro Prot & Lispro (HUMALOG 75/25 MIX) (75-25) 100 UNIT/ML Kwikpen Inject 15-21 Units into the skin with breakfast, with lunch, and with evening meal. Sliding scale insulin    . levothyroxine (SYNTHROID, LEVOTHROID) 75 MCG tablet Take 75 mcg daily before breakfast by mouth.     . loperamide (IMODIUM) 2 MG capsule Take 1 capsule (2 mg total) by mouth 4 (four) times daily as needed for diarrhea or loose stools. (Patient taking differently: Take 2 mg by mouth as needed for diarrhea or loose stools. ) 12 capsule 0  . losartan (COZAAR) 100 MG tablet Take 1 tablet (100 mg total) by mouth daily. 90 tablet 3  . NOVOFINE PLUS 32G X 4 MM MISC 1 each by Subdermal route 2 (two) times daily. as directed 60 each 3  . potassium chloride (KLOR-CON) 10 MEQ tablet Take 2 tablets (20 mEq total) by mouth daily. 180 tablet 3  . RESTASIS MULTIDOSE 0.05 % ophthalmic emulsion Place 1 drop into both eyes 2 (two) times daily.     . rosuvastatin (CRESTOR) 10 MG tablet Take 10 mg by mouth every evening.    . torsemide (DEMADEX) 20 MG tablet Take 2 tablets (40 mg total) by mouth daily. 180 tablet 3  . traMADol (ULTRAM) 50 MG tablet Take 50 mg by mouth in the morning, at noon, and at bedtime.     Marland Kitchen diltiazem (CARDIZEM CD) 120 MG 24 hr capsule Take 1 capsule (120 mg total) by mouth daily. 30 capsule 3  . Semaglutide,0.25 or 0.5MG/DOS, (OZEMPIC, 0.25 OR 0.5 MG/DOSE,) 2 MG/1.5ML SOPN Inject 0.75 mLs (1 mg total) into the skin once a week. 12 pen 3   No current facility-administered medications for this encounter.    Allergies  Allergen Reactions  . Metformin Diarrhea    Social History   Socioeconomic History  . Marital status: Married    Spouse name: Not on file  . Number of children: 3  . Years of education: Not  on file  . Highest education level: Not on file  Occupational History  . Occupation: DISABILITY/housewife    Employer: RETIRED  Tobacco Use  . Smoking status: Never Smoker  . Smokeless tobacco: Never Used  Vaping Use  . Vaping Use: Never used  Substance and Sexual Activity  . Alcohol use: No  . Drug use: No  . Sexual activity: Yes  Other Topics Concern  . Not on file  Social History Narrative  . Not on file  Social Determinants of Health   Financial Resource Strain:   . Difficulty of Paying Living Expenses: Not on file  Food Insecurity:   . Worried About Charity fundraiser in the Last Year: Not on file  . Ran Out of Food in the Last Year: Not on file  Transportation Needs:   . Lack of Transportation (Medical): Not on file  . Lack of Transportation (Non-Medical): Not on file  Physical Activity:   . Days of Exercise per Week: Not on file  . Minutes of Exercise per Session: Not on file  Stress:   . Feeling of Stress : Not on file  Social Connections:   . Frequency of Communication with Friends and Family: Not on file  . Frequency of Social Gatherings with Friends and Family: Not on file  . Attends Religious Services: Not on file  . Active Member of Clubs or Organizations: Not on file  . Attends Archivist Meetings: Not on file  . Marital Status: Not on file  Intimate Partner Violence:   . Fear of Current or Ex-Partner: Not on file  . Emotionally Abused: Not on file  . Physically Abused: Not on file  . Sexually Abused: Not on file    Family History  Problem Relation Age of Onset  . Heart disease Father   . Hypertension Father   . Breast cancer Sister   . Cancer Sister        breast    ROS- All systems are reviewed and negative except as per the HPI above  Physical Exam: Vitals:   07/18/20 0936  BP: 138/62  Pulse: (!) 58  Weight: 116.5 kg  Height: 5' (1.524 m)   Wt Readings from Last 3 Encounters:  07/18/20 116.5 kg  07/11/20 116.2 kg    07/07/20 114.3 kg    Labs: Lab Results  Component Value Date   NA 140 07/08/2020   K 3.3 (L) 07/08/2020   CL 97 (L) 07/08/2020   CO2 29 07/08/2020   GLUCOSE 134 (H) 07/08/2020   BUN 23 07/08/2020   CREATININE 0.86 07/08/2020   CALCIUM 9.0 07/08/2020   MG 1.9 02/21/2020   Lab Results  Component Value Date   INR 2.0 06/15/2020   Lab Results  Component Value Date   CHOL 146 02/28/2020   HDL 58 02/28/2020   LDLCALC 52 02/28/2020   TRIG 226 (H) 02/28/2020     GEN- The patient is well appearing, alert and oriented x 3 today.   Head- normocephalic, atraumatic Eyes-  Sclera clear, conjunctiva pink Ears- hearing intact Oropharynx- clear Neck- supple, no JVP Lymph- no cervical lymphadenopathy Lungs- Clear to ausculation bilaterally, normal work of breathing Heart-  regular rate and rhythm, no murmurs, rubs or gallops, PMI not laterally displaced GI- soft, NT, ND, + BS Extremities- no clubbing, cyanosis, or edema MS- no significant deformity or atrophy Skin- no rash or lesion Psych- euthymic mood, full affect Neuro- strength and sensation are intact  EKG- sinus brady at 58 bpm, PR int 276 ms, qrs int 82 ms, qtc 494 ms  Epic records reviewed Echo-Study Conclusions  - Left ventricle: The cavity size was normal. Systolic function was   normal. The estimated ejection fraction was in the range of 60%   to 65%. Wall motion was normal; there were no regional wall   motion abnormalities. Features are consistent with a pseudonormal   left ventricular filling pattern, with concomitant abnormal   relaxation and increased filling pressure (grade  2 diastolic   dysfunction). Doppler parameters are consistent with high   ventricular filling pressure. - Aortic valve: Transvalvular velocity was within the normal range.   There was no stenosis. There was no regurgitation. - Mitral valve: Transvalvular velocity was within the normal range.   There was no evidence for stenosis. There  was mild regurgitation.   Valve area by pressure half-time: 1.79 cm^2. Valve area by   continuity equation (using LVOT flow): 1.1 cm^2. - Left atrium: The atrium was severely dilated. - Right ventricle: The cavity size was normal. Wall thickness was   normal. Systolic function was normal. - Tricuspid valve: There was trivial regurgitation. - Pulmonary arteries: Systolic pressure was within the normal   range. PA peak pressure: 25 mm Hg (S).    Assessment and Plan: 1. Paroxysmal  afib Increased burden  Continue amiodarone at 200 mg but increase to daily instead of M-F She had been given 30 mg cardizem to use for breakthrough afib but it did not work that well for her  I will try stopping amlodipine and starting Cardizem 120 mg qd for more rate control and to encourage more SR  Can still use cardizem 30 mg as needed for breakthrough afib if needed  She will check her BP at home to make sure BP is adequately controlled with this change   2. Chadsvasc score of at least 5 Continue eliquis 5 mg bid   I will see back in one week   Butch Penny C. Anmol Fleck, [facility] [address] [place], [place] [address] [phone]

## 2020-07-18 NOTE — Progress Notes (Signed)
The patient is a very pleasant 6 years referral she says she has had 3 mole for the emergency room after mechanical fall spraining her left knee.  She states that she is having problems ambulating and she has had 3 falls just this month alone.  She says the knee gives out on her.  She denies any knee pain but she is afraid that she may fall again.  She has significant comorbidities including a BMI of 50.15.  She is diabetic and reports a hemoglobin A1c of in the 7 area.  She is also on chronic blood thinning medications and has a history of cardiac issues in the past.  Examination of her left knee shows medial lateral joint line tenderness.  There is a positive McMurray's sign to the medial compartment.  There is no effusion today.  Her knee is very small but she has a very large thigh.  X-rays of the left knee show moderate arthritic findings only involving the medial compartment and the patellofemoral joint.  There is no evidence of fracture.  She would definitely benefit from guided physical therapy to strengthen the muscles around her left knee.  I would also like to send her to [employer] to see if they can somehow fashion a knee brace to get help for support.  All question concerns were answered and addressed.  I will see her back in about 6 weeks to see how she is doing overall.

## 2020-07-18 NOTE — Telephone Encounter (Signed)
Re-faxed DME for bedside commode and walker rolling to [employer] to [phone]. Confirmation page 2:31 pm. Also, spoke to patient earlier and she stated someone from [employer] called yesterday because the orders were not received.

## 2020-07-18 NOTE — Patient Instructions (Signed)
Stop amlodipine  Start cardizem 120mg  once a day  Amiodarone 200mg  once a day everyday

## 2020-07-20 ENCOUNTER — Other Ambulatory Visit: Payer: Self-pay

## 2020-07-20 DIAGNOSIS — G8929 Other chronic pain: Secondary | ICD-10-CM

## 2020-07-20 DIAGNOSIS — M25561 Pain in right knee: Secondary | ICD-10-CM

## 2020-07-20 DIAGNOSIS — Z794 Long term (current) use of insulin: Secondary | ICD-10-CM | POA: Diagnosis not present

## 2020-07-20 DIAGNOSIS — E1142 Type 2 diabetes mellitus with diabetic polyneuropathy: Secondary | ICD-10-CM | POA: Diagnosis not present

## 2020-07-25 ENCOUNTER — Other Ambulatory Visit: Payer: Self-pay

## 2020-07-25 ENCOUNTER — Ambulatory Visit (HOSPITAL_COMMUNITY)
Admission: RE | Admit: 2020-07-25 | Discharge: 2020-07-25 | Disposition: A | Discharge status: Home / Self Care | Payer: Medicare Other | Source: Ambulatory Visit | Attending: Nurse Practitioner | Admitting: Nurse Practitioner

## 2020-07-25 ENCOUNTER — Encounter (HOSPITAL_COMMUNITY): Payer: Self-pay | Admitting: Nurse Practitioner

## 2020-07-25 VITALS — BP 132/88 | HR 108 | Ht 60.0 in | Wt 252.2 lb

## 2020-07-25 DIAGNOSIS — N189 Chronic kidney disease, unspecified: Secondary | ICD-10-CM | POA: Diagnosis not present

## 2020-07-25 DIAGNOSIS — E1122 Type 2 diabetes mellitus with diabetic chronic kidney disease: Secondary | ICD-10-CM | POA: Insufficient documentation

## 2020-07-25 DIAGNOSIS — I5032 Chronic diastolic (congestive) heart failure: Secondary | ICD-10-CM | POA: Diagnosis not present

## 2020-07-25 DIAGNOSIS — I480 Paroxysmal atrial fibrillation: Secondary | ICD-10-CM | POA: Diagnosis not present

## 2020-07-25 DIAGNOSIS — I130 Hypertensive heart and chronic kidney disease with heart failure and stage 1 through stage 4 chronic kidney disease, or unspecified chronic kidney disease: Secondary | ICD-10-CM | POA: Insufficient documentation

## 2020-07-25 DIAGNOSIS — Z8249 Family history of ischemic heart disease and other diseases of the circulatory system: Secondary | ICD-10-CM | POA: Diagnosis not present

## 2020-07-25 DIAGNOSIS — Z79890 Hormone replacement therapy (postmenopausal): Secondary | ICD-10-CM | POA: Insufficient documentation

## 2020-07-25 DIAGNOSIS — Z7901 Long term (current) use of anticoagulants: Secondary | ICD-10-CM | POA: Insufficient documentation

## 2020-07-25 DIAGNOSIS — E785 Hyperlipidemia, unspecified: Secondary | ICD-10-CM | POA: Diagnosis not present

## 2020-07-25 DIAGNOSIS — G4733 Obstructive sleep apnea (adult) (pediatric): Secondary | ICD-10-CM | POA: Insufficient documentation

## 2020-07-25 DIAGNOSIS — D6869 Other thrombophilia: Secondary | ICD-10-CM

## 2020-07-25 DIAGNOSIS — Z79899 Other long term (current) drug therapy: Secondary | ICD-10-CM | POA: Insufficient documentation

## 2020-07-25 MED ORDER — DILTIAZEM HCL ER COATED BEADS 120 MG PO CP24: 120.0000 mg | ORAL_CAPSULE | Freq: Two times a day (BID) | ORAL | 3 refills | Status: DC

## 2020-07-25 NOTE — Progress Notes (Signed)
Primary Care Physician: Horald Pollen, MD Referring Physician:[facility] triage   [name] is a 79 y.o. female with a h/o paroxysmal afib previously on flecainide, dofetilide and most recently on amiodarone. She is being seen today in the afib clinic. She is in SR but does report that she had a ER visit for afib that started around 4 am in the morning with HR's around 150 bpm. She  took her cardizem 120 mg at that time and again at 10  am. She  decided to go to the ER but by the time she was seen, she had returned to [place].   Overall her health has been stable. She had covid in January but she did not require hospitalization.  She remains on amiodarone 200 mg M-F only. TSH/Cmet checked in May  were in [place]. Marland Kitchen Remains on warfarin with CHA2DS2VASc score of at least 5. No bleeding issues. No further issues with afib.   F/u in afib clinic,07/18/20. She is here as she has had more  afib burden over the last month. SHe was initially seen by A. Lynnell Jude, NP, earlier in the month, amiodarone was increased to 200 mg bid M-Fand then set up for cardioversion. She  was in SR at time of cardioversion so it was cancelled. Then she noted more afib over the last 1-2 days. She was given an appointment here and presents today in [place]. She felt she may have converted late last night or early this am. No known trigger. She is not an ideal  ablation candidate 2/2 morbid obesity.   F/u in afib clinic,07/25/20. She is in afib at 108 bpm. She states that she went into afib Sunday night thru today. She was in SR when I saw her last. Even though her medical record showed she was on amio M-F, and on last visit, she was to take every day, she got confused and only took it M/W/F last week. She is having a very painful L knee that is limiting her ambulation and is to start PT next week.   Today, she denies symptoms of palpitations, chest pain, shortness of breath, orthopnea, PND, lower extremity edema, dizziness, presyncope, syncope,  or neurologic sequela. The patient is tolerating medications without difficulties and is otherwise without complaint today.   Past Medical History:  Diagnosis Date  . Arthritis   . Back pain   . Chronic anticoagulation    due to aflutter  . Chronic kidney disease   . Diabetes mellitus   . Diastolic CHF, chronic ([place])    a.  echo 2006 - ef 55-65%; mild diast dysfxn;    b. Echo 08/2011: Mild LVH, EF 60%;  c. 04/2013 Echo: EF 65-69%, mild conc LVH;  08/2014 Echo: EF 60-65%, mild-mod MR.  . Gout   . Hyperlipidemia   . Hypertension    a.  Renal arterial Dopplers 12/2011: 1-59% right renal artery stenosis  . Morbid obesity ([place])   . Obstructive sleep apnea on CPAP   . Paroxysmal Afib/Flutter    a. 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   Past Surgical History:  Procedure Laterality Date  . APPENDECTOMY    . ATRIAL FLUTTER ABLATION N/A 09/24/2011   Procedure: ATRIAL FLUTTER ABLATION;  Surgeon: [name], MD;  Location: [facility] CATH LAB;  Service: Cardiovascular;  Laterality: N/A;  . CARDIOVERSION  10/22/2011   Procedure: CARDIOVERSION;  Surgeon: [name], MD;  Location: [facility];  Service: Cardiovascular;  Laterality: N/A;  . CARDIOVERSION N/A 09/10/2011  Procedure: CARDIOVERSION;  Surgeon: [name], MD;  Location: [facility] CATH LAB;  Service: Cardiovascular;  Laterality: N/A;  . CHOLECYSTECTOMY    . TONSILLECTOMY  1982  . TOTAL ABDOMINAL HYSTERECTOMY      Current Outpatient Medications  Medication Sig Dispense Refill  . Accu-Chek Softclix Lancets lancets 1 each by Other route 3 (three) times daily. as directed 100 each 3  . allopurinol (ZYLOPRIM) 300 MG tablet Take 300 mg by mouth daily.     Marland Kitchen amiodarone (PACERONE) 200 MG tablet Take 1 tablet (200 mg total) by mouth daily. 90 tablet 3  . apixaban (ELIQUIS) 5 MG TABS tablet Take 1 tablet (5 mg total) by mouth 2 (two) times daily. 180 tablet 1  . blood glucose meter kit and supplies Dispense based on patient and insurance preference.  Use up to four times daily as directed. (FOR ICD-10 E10.9, E11.9). 1 each 0  . cloNIDine (CATAPRES) 0.3 MG tablet Take 1 tablet (0.3 mg total) by mouth 2 (two) times daily. 60 tablet 3  . Continuous Blood Gluc Receiver (FREESTYLE LIBRE 14 DAY READER) DEVI Use as directed. 1 each 0  . diltiazem (CARDIZEM CD) 120 MG 24 hr capsule Take 1 capsule (120 mg total) by mouth in the morning and at bedtime. 30 capsule 3  . diltiazem (CARDIZEM) 30 MG tablet Take one tablet every 4 hours as needed for heart rate greater than 100 and blood pressure needs to be above 100 30 tablet 6  . Insulin Lispro Prot & Lispro (HUMALOG 75/25 MIX) (75-25) 100 UNIT/ML Kwikpen Inject 15-21 Units into the skin with breakfast, with lunch, and with evening meal. Sliding scale insulin    . levothyroxine (SYNTHROID, LEVOTHROID) 75 MCG tablet Take 75 mcg daily before breakfast by mouth.     . loperamide (IMODIUM) 2 MG capsule Take 1 capsule (2 mg total) by mouth 4 (four) times daily as needed for diarrhea or loose stools. 12 capsule 0  . losartan (COZAAR) 100 MG tablet Take 1 tablet (100 mg total) by mouth daily. 90 tablet 3  . NOVOFINE PLUS 32G X 4 MM MISC 1 each by Subdermal route 2 (two) times daily. as directed 60 each 3  . potassium chloride (KLOR-CON) 10 MEQ tablet Take 2 tablets (20 mEq total) by mouth daily. 180 tablet 3  . RESTASIS MULTIDOSE 0.05 % ophthalmic emulsion Place 1 drop into both eyes 2 (two) times daily.     . rosuvastatin (CRESTOR) 10 MG tablet Take 10 mg by mouth every evening.    . Semaglutide,0.25 or 0.5MG/DOS, (OZEMPIC, 0.25 OR 0.5 MG/DOSE,) 2 MG/1.5ML SOPN Inject 0.75 mLs (1 mg total) into the skin once a week. 12 pen 3  . torsemide (DEMADEX) 20 MG tablet Take 2 tablets (40 mg total) by mouth daily. 180 tablet 3  . traMADol (ULTRAM) 50 MG tablet Take 50 mg by mouth in the morning, at noon, and at bedtime.      No current facility-administered medications for this encounter.    Allergies  Allergen  Reactions  . Metformin Diarrhea    Social History   Socioeconomic History  . Marital status: Married    Spouse name: Not on file  . Number of children: 3  . Years of education: Not on file  . Highest education level: Not on file  Occupational History  . Occupation: DISABILITY/housewife    Employer: RETIRED  Tobacco Use  . Smoking status: Never Smoker  . Smokeless tobacco: Never Used  Vaping Use  .  Vaping Use: Never used  Substance and Sexual Activity  . Alcohol use: No  . Drug use: No  . Sexual activity: Yes  Other Topics Concern  . Not on file  Social History Narrative  . Not on file   Social Determinants of Health   Financial Resource Strain:   . Difficulty of Paying Living Expenses: Not on file  Food Insecurity:   . Worried About Charity fundraiser in the Last Year: Not on file  . Ran Out of Food in the Last Year: Not on file  Transportation Needs:   . Lack of Transportation (Medical): Not on file  . Lack of Transportation (Non-Medical): Not on file  Physical Activity:   . Days of Exercise per Week: Not on file  . Minutes of Exercise per Session: Not on file  Stress:   . Feeling of Stress : Not on file  Social Connections:   . Frequency of Communication with Friends and Family: Not on file  . Frequency of Social Gatherings with Friends and Family: Not on file  . Attends Religious Services: Not on file  . Active Member of Clubs or Organizations: Not on file  . Attends Archivist Meetings: Not on file  . Marital Status: Not on file  Intimate Partner Violence:   . Fear of Current or Ex-Partner: Not on file  . Emotionally Abused: Not on file  . Physically Abused: Not on file  . Sexually Abused: Not on file    Family History  Problem Relation Age of Onset  . Heart disease Father   . Hypertension Father   . Breast cancer Sister   . Cancer Sister        breast    ROS- All systems are reviewed and negative except as per the HPI  above  Physical Exam: Vitals:   07/25/20 1024  BP: 132/88  Pulse: (!) 108  Weight: 114.4 kg  Height: 5' (1.524 m)   Wt Readings from Last 3 Encounters:  07/25/20 114.4 kg  07/18/20 116.5 kg  07/11/20 116.2 kg    Labs: Lab Results  Component Value Date   NA 140 07/08/2020   K 3.3 (L) 07/08/2020   CL 97 (L) 07/08/2020   CO2 29 07/08/2020   GLUCOSE 134 (H) 07/08/2020   BUN 23 07/08/2020   CREATININE 0.86 07/08/2020   CALCIUM 9.0 07/08/2020   MG 1.9 02/21/2020   Lab Results  Component Value Date   INR 2.0 06/15/2020   Lab Results  Component Value Date   CHOL 146 02/28/2020   HDL 58 02/28/2020   LDLCALC 52 02/28/2020   TRIG 226 (H) 02/28/2020     GEN- The patient is well appearing, alert and oriented x 3 today.   Head- normocephalic, atraumatic Eyes-  Sclera clear, conjunctiva pink Ears- hearing intact Oropharynx- clear Neck- supple, no JVP Lymph- no cervical lymphadenopathy Lungs- Clear to ausculation bilaterally, normal work of breathing Heart-  irregular rate and rhythm, no murmurs, rubs or gallops, PMI not laterally displaced GI- soft, NT, ND, + BS Extremities- no clubbing, cyanosis, or edema MS- no significant deformity or atrophy Skin- no rash or lesion Psych- euthymic mood, full affect Neuro- strength and sensation are intact  EKG- afib at 108 bpm, qrs int 80 ms, qtc 511 ms  Epic records reviewed Echo-Study Conclusions  - Left ventricle: The cavity size was normal. Systolic function was   normal. The estimated ejection fraction was in the range of 60%  to 65%. Wall motion was normal; there were no regional wall   motion abnormalities. Features are consistent with a pseudonormal   left ventricular filling pattern, with concomitant abnormal   relaxation and increased filling pressure (grade 2 diastolic   dysfunction). Doppler parameters are consistent with high   ventricular filling pressure. - Aortic valve: Transvalvular velocity was within the  normal range.   There was no stenosis. There was no regurgitation. - Mitral valve: Transvalvular velocity was within the normal range.   There was no evidence for stenosis. There was mild regurgitation.   Valve area by pressure half-time: 1.79 cm^2. Valve area by   continuity equation (using LVOT flow): 1.1 cm^2. - Left atrium: The atrium was severely dilated. - Right ventricle: The cavity size was normal. Wall thickness was   normal. Systolic function was normal. - Tricuspid valve: There was trivial regurgitation. - Pulmonary arteries: Systolic pressure was within the normal   range. PA peak pressure: 25 mm Hg (S).    Assessment and Plan: 1. Paroxysmal afib In afib today  Increased afib burden recently  Reminded to take  amiodarone at 200 mg daily  Increase cardizem to 120 mg  bid  Can still use cardizem 30 mg as needed    2. Chadsvasc score of at least 5 Continue eliquis 5 mg bid   I will see back in one week   Butch Penny C. Nannie Starzyk, [facility] [address] [place], [place] [address] [phone]

## 2020-07-25 NOTE — Patient Instructions (Signed)
Take amiodarone every day  Increase cardizem (diltiazem) 120mg  to twice a day

## 2020-07-26 ENCOUNTER — Ambulatory Visit (INDEPENDENT_AMBULATORY_CARE_PROVIDER_SITE_OTHER): Payer: Medicare Other | Admitting: Emergency Medicine

## 2020-07-26 ENCOUNTER — Other Ambulatory Visit: Payer: Self-pay

## 2020-07-26 ENCOUNTER — Encounter: Payer: Self-pay | Admitting: Emergency Medicine

## 2020-07-26 VITALS — BP 135/73 | HR 124 | Temp 97.9°F | Resp 16 | Ht 60.0 in | Wt 255.0 lb

## 2020-07-26 DIAGNOSIS — L259 Unspecified contact dermatitis, unspecified cause: Secondary | ICD-10-CM | POA: Diagnosis not present

## 2020-07-26 DIAGNOSIS — R21 Rash and other nonspecific skin eruption: Secondary | ICD-10-CM

## 2020-07-26 MED ORDER — TRIAMCINOLONE ACETONIDE 0.1 % EX CREA: 1.0000 "application " | TOPICAL_CREAM | Freq: Two times a day (BID) | CUTANEOUS | 0 refills | Status: DC

## 2020-07-26 NOTE — Patient Instructions (Addendum)
If you have lab work done today you will be contacted with your lab results within the next 2 weeks.  If you have not heard from [place] then please contact us. The fastest way to get your results is to register for My Chart.   IF you received an x-ray today, you will receive an invoice from [facility]. Please contact [facility] at [phone] with questions or concerns regarding your invoice.   IF you received labwork today, you will receive an invoice from [facility]. Please contact [facility] at [phone] with questions or concerns regarding your invoice.   Our billing staff will not be able to assist you with questions regarding bills from these companies.  You will be contacted with the lab results as soon as they are available. The fastest way to get your results is to activate your My Chart account. Instructions are located on the last page of this paperwork. If you have not heard from [place] regarding the results in 2 weeks, please contact this office.     Eccema Eczema El eccema es un trmino amplio que define un grupo de afecciones dermatolgicas que provocan aspereza e inflamacin de la piel. Cada tipo de eccema tiene diferentes desencadenantes, sntomas y tratamientos. Habitualmente, cualquier tipo de eccema provoca comezn y los sntomas varan de leves a intensos. El eccema y sus sntomas no se transmiten de [place] persona a otra (no es contagioso). Puede aparecer en diferentes partes del cuerpo en distintos momentos. Su eccema puede no ser igual al eccema de otra persona. Cules son los tipos de [place]? Dermatitis atpica Es una afeccin dermatolgica crnica que siempre vuelve a aparecer (recurrente). Los sntomas comunes son piel seca y granos pequeos y slidos que pueden inflamarse y expulsar lquido (drenar). Dermatitis de contacto  Esto ocurre cuando hay otro factor que irrita la piel y causa la erupcin. La irritacin puede estar provocada por el contacto  con sustancias a las que es alrgico, como la hiedra venenosa, o qumicos o medicamentos que se aplic sobre la piel. Eccema dishidrtico Es un tipo de eccema que [employer] manos y los pies. Se presenta como ampollas llenas de lquido que causan mucha comezn. Puede afectar a personas de cualquier edad, pero es ms comn antes de los 40 aos. Eccema de las manos  Causa comezn en la piel en algunas zonas de las [place], y los laterales de las manos y los dedos. Este tipo de eccema es comn en trabajos industriales donde la persona est expuesta a muchos tipos de Designer, multimedia. Liquen simple crnico Este tipo de [place] se presenta cuando una persona se rasca constantemente una zona del cuerpo. Al rascar repetidamente el mismo lugar, la piel se engrosa (liquenificacin). El liquen simple crnico se puede presentar junto con otros tipos de eccema. Es ms comn en adultos, pero tambin puede presentarse en nios. Eccema numular Es un tipo comn de [place]. No tiene una causa conocida. Habitualmente causa una lesin roja, circular y con una corteza dura (placa) que puede picar. Rascarse puede convertirse en un hbito y causar sangrado. Ocurre ms a menudo en personas de edad media o [employer]. En la [employer] de los [employer]. Dermatitis seborreica Es una afeccin dermatolgica comn que afecta principalmente el cuero cabelludo. Tambin puede [employer] zonas grasosas del cuerpo, como el rostro, los laterales de la nariz, las cejas, las [place], los prpados y Management consultant. Se identifica por el enrojecimiento y una pequea descamacin de la piel (  eritema). Puede afectar a Engineer, manufacturing de [employer]. En los nios, se la conoce como "dermatitis seborreica infantil". Dermatitis por estasis Esta es una afeccin dermatolgica comn que aparece en las piernas y los pies. Es ms comn en personas que tienen una enfermedad que evita que la sangre bombee a travs de las venas de las piernas  (insuficiencia venosa crnica). La dermatitis por estasis es una afeccin crnica que necesita tratamiento a largo plazo. Cmo se diagnostica el eccema? El mdico examinar su piel y revisar sus antecedentes mdicos. Es posible que le hagan pruebas dermatolgicas con parches. En este tipo de [place], se aplican parches en la espalda que contienen posibles alrgenos. A los pocos das, el mdico lo revisar para ver si hubo Nurse, mental health. Cules son los tratamientos frecuentes? El tratamiento del eccema depende del tipo de eccema que tenga. La hidrocortisona, un medicamento con corticoesteroides, puede aliviar rpidamente la comezn y ayudar a Systems developer. Este medicamento puede ser recetado o de venta libre, de acuerdo a la concentracin del medicamento que se necesite. Siga estas indicaciones en su casa:  Tome los medicamentos de venta libre y los recetados solamente como se lo haya indicado el mdico.  Use cremas y ungentos para Adult nurse piel. No use lociones.  Sepa cules son los desencadenantes o los agentes irritantes que causan los sntomas. Evtelos.  Trate el brote de los sntomas rpidamente.  No se rasque la piel. Esto puede empeorar la erupcin.  Concurra a todas las visitas de control como se lo haya indicado el mdico. Esto es importante. Dnde encontrar ms informacin  [place] de English as a second language teacher of Dermatology): http://jones-macias.info/  [employer] de [place] (The National eccema Association): www.nationaleczema.org Comunquese con un mdico si:  Tiene comezn intensa, incluso con el tratamiento.  Habitualmente se rasca la piel hasta que sangra.  La erupcin tiene un aspecto diferente del habitual.  La piel le duele, est hinchada o ms roja que lo normal.  Tiene fiebre. Resumen  Existen ocho tipos generales de eccema. Cada uno tiene diferentes desencadenantes.  Cualquier tipo de eccema causa comezn que puede  variar de leve a intensa.  El tratamiento vara en funcin del tipo de eccema que tenga. La hidrocortisona, un medicamento con corticoesteroides, puede ayudar a disminuir la inflamacin y Facilities manager.  La mejor manera de evitar el eccema es protegiendo la piel. Use humectantes y lociones. Evite los desencadenantes y agentes irritantes, y trate los brotes rpidamente. Esta informacin no tiene Marine scientist el consejo del mdico. Asegrese de hacerle al mdico cualquier pregunta que tenga. Document Revised: 05/18/2017 Document Reviewed: 05/18/2017 Elsevier Patient Education  [place].

## 2020-07-26 NOTE — Progress Notes (Signed)
[name] 79 y.o.   Chief Complaint  Patient presents with  . Rash    for 1 week on both legs with itching  Recent cardiology visit office note: Assessment and Plan: 1. Paroxysmal afib In afib today  Increased afib burden recently  Reminded to take  amiodarone at 200 mg daily  Increase cardizem to 120 mg  bid  Can still use cardizem 30 mg as needed    2. Chadsvasc score of at least 5 Continue eliquis 5 mg bid   I will see back in one week   Butch Penny C. Mila Homer Afib [facility] [place], [place] [id number] [phone]  HISTORY OF PRESENT ILLNESS: This is a 79 y.o. female complaining of itchy skin rash to both legs below knee areas.  Feet not affected.  It started about 1 week ago.  No other associated symptoms.  No other complaints or medical concerns today.  Orthopedic office visit note on 07/18/2020: The patient is a very pleasant 6 years referral she says she has had 3 mole for the emergency room after mechanical fall spraining her left knee.  She states that she is having problems ambulating and she has had 3 falls just this month alone.  She says the knee gives out on her.  She denies any knee pain but she is afraid that she may fall again.  She has significant comorbidities including a BMI of 50.15.  She is diabetic and reports a hemoglobin A1c of in the 7 area.  She is also on chronic blood thinning medications and has a history of cardiac issues in the past.  Examination of her left knee shows medial lateral joint line tenderness.  There is a positive McMurray's sign to the medial compartment.  There is no effusion today.  Her knee is very small but she has a very large thigh.  X-rays of the left knee show moderate arthritic findings only involving the medial compartment and the patellofemoral joint.  There is no evidence of fracture.  She would definitely benefit from guided physical therapy to strengthen the muscles around  her left knee.  I would also like to [facility] to [facility] to see if they can somehow fashion a knee brace to get help for support.  All question concerns were answered and addressed.  I will see her back in about 6 weeks to see how she is doing overall. HPI   Prior to Admission medications   Medication Sig Start Date End Date Taking? Authorizing Provider  allopurinol (ZYLOPRIM) 300 MG tablet Take 300 mg by mouth daily.  07/07/13  Yes [provider]  amiodarone (PACERONE) 200 MG tablet Take 1 tablet (200 mg total) by mouth daily. 07/18/20  Yes Sherran Needs, NP  apixaban (ELIQUIS) 5 MG TABS tablet Take 1 tablet (5 mg total) by mouth 2 (two) times daily. 06/14/20  Yes [name], MD  cloNIDine (CATAPRES) 0.3 MG tablet Take 1 tablet (0.3 mg total) by mouth 2 (two) times daily. 06/08/20  Yes Posey Boyer, MD  diltiazem (CARDIZEM CD) 120 MG 24 hr capsule Take 1 capsule (120 mg total) by mouth in the morning and at bedtime. 07/25/20  Yes Sherran Needs, NP  Insulin Lispro Prot & Lispro (HUMALOG 75/25 MIX) (75-25) 100 UNIT/ML Kwikpen Inject 15-21 Units into the skin with breakfast, with lunch, and with evening meal. Sliding scale insulin 08/12/16  Yes [provider]  levothyroxine (SYNTHROID, [employer]) 75 MCG  tablet Take 75 mcg daily before breakfast by mouth.  12/29/16  Yes [provider]  loperamide (IMODIUM) 2 MG capsule Take 1 capsule (2 mg total) by mouth 4 (four) times daily as needed for diarrhea or loose stools. 03/20/20  Yes Garald Balding, PA-C  losartan (COZAAR) 100 MG tablet Take 1 tablet (100 mg total) by mouth daily. 05/31/20  Yes Jibril Mcminn, Ines Bloomer, MD  potassium chloride (KLOR-CON) 10 MEQ tablet Take 2 tablets (20 mEq total) by mouth daily. 10/18/19  Yes [name], MD  RESTASIS MULTIDOSE 0.05 % ophthalmic emulsion Place 1 drop into both eyes 2 (two) times daily.  03/04/20  Yes [provider]  rosuvastatin (CRESTOR) 10 MG tablet Take 10  mg by mouth every evening.   Yes [provider]  torsemide (DEMADEX) 20 MG tablet Take 2 tablets (40 mg total) by mouth daily. 09/29/19  Yes [name], MD  traMADol (ULTRAM) 50 MG tablet Take 50 mg by mouth in the morning, at noon, and at bedtime.    Yes [provider]  Accu-Chek Softclix Lancets lancets 1 each by Other route 3 (three) times daily. as directed 03/30/20   Maximiano Coss, NP  blood glucose meter kit and supplies Dispense based on patient and insurance preference. Use up to four times daily as directed. (FOR ICD-10 E10.9, E11.9). 05/03/20   Horald Pollen, MD  Continuous Blood Gluc Receiver (FREESTYLE LIBRE 14 DAY READER) DEVI Use as directed. 04/18/20   Horald Pollen, MD  diltiazem (CARDIZEM) 30 MG tablet Take one tablet every 4 hours as needed for heart rate greater than 100 and blood pressure needs to be above 100 Patient not taking: Reported on 07/26/2020 05/09/20   Sherran Needs, NP  NOVOFINE PLUS 32G X 4 MM MISC 1 each by Subdermal route 2 (two) times daily. as directed 03/30/20   Maximiano Coss, NP  Semaglutide,0.25 or 0.5MG/DOS, (OZEMPIC, 0.25 OR 0.5 MG/DOSE,) 2 MG/1.5ML SOPN Inject 0.75 mLs (1 mg total) into the skin once a week. 04/18/20   Horald Pollen, MD    Allergies  Allergen Reactions  . Metformin Diarrhea    Patient Active Problem List   Diagnosis Date Noted  . Type 2 diabetes mellitus with hyperglycemia, with long-term current use of insulin ([place]) 06/21/2020  . Type 2 diabetes mellitus with diabetic polyneuropathy, with long-term current use of insulin ([place]) 06/21/2020  . Mixed hyperlipidemia 06/21/2020  . Uncontrolled hypertension 06/11/2020  . Aortic atherosclerosis ([place]) 03/28/2020  . Diabetic peripheral neuropathy ([place]) 12/26/2019  . Obesity 12/19/2015  . Hypersomnia 12/19/2015  . Acute diastolic congestive heart failure, NYHA class 3 ([place]) 10/03/2013  . Prolonged QT interval 10/03/2013  . Acute diastolic  heart failure ([place]) 10/03/2013  . Long term (current) use of anticoagulants 06/09/2012  . BENIGN NEOPLASM OF ADRENAL GLAND 11/25/2010  . Chronic diastolic heart failure ([place]) 02/20/2010  . DM 05/16/2009  . GOUT 05/16/2009  . OBESITY, MORBID 05/16/2009  . Hypertension associated with diabetes ([place]) 05/16/2009  . Paroxysmal atrial fibrillation ([place]) 05/16/2009  . HYPERLIPIDEMIA 11/30/2008  . Obstructive sleep apnea on CPAP 11/30/2008    Past Medical History:  Diagnosis Date  . Arthritis   . Back pain   . Chronic anticoagulation    due to aflutter  . Chronic kidney disease   . Diabetes mellitus   . Diastolic CHF, chronic ([place])    a.  echo 2006 - ef 55-65%; mild diast dysfxn;    b. Echo  08/2011: Mild LVH, EF 60%;  c. 04/2013 Echo: EF 65-69%, mild conc LVH;  08/2014 Echo: EF 60-65%, mild-mod MR.  . Gout   . Hyperlipidemia   . Hypertension    a.  Renal arterial Dopplers 12/2011: 1-59% right renal artery stenosis  . Morbid obesity ([place])   . Obstructive sleep apnea on CPAP   . Paroxysmal Afib/Flutter    a. 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 APPENDECTOMY    . ATRIAL FLUTTER ABLATION N/A 09/24/2011   Procedure: ATRIAL FLUTTER ABLATION;  Surgeon: [name], MD;  [facility] C[facility] LAB;  Service: Cardiovascular;  Laterality: N/A;  . CARDIOVERSION  10/22/2011   Procedure: CARDIOVERSION;  Surgeon: [name], MD;  Location: [facility];  Service: Cardiovascular;  Laterality: N/A;  . CARDIOVERSION N/A 09/10/2011   Procedure: CARDIOVERSION;  Surgeon: [name], MD;  Location[facility] C[facility] LAB;  Service: Cardiovascular;  Laterality: N/A;  . CHOLECYSTECTOMY    . TONSILLECTOMY  1982  . TOTAL ABDOMINAL HYSTERECTOMY      Social History   Socioeconomic History  . Marital status: Married    Spouse name: Not on file  . Number of children: 3  . Years of education: Not on file  . Highest education level: Not on file  Occupational  History  . Occupation: DISABILITY/housewife    Employer: RETIRED  Tobacco Use  . Smoking status: Never Smoker  . Smokeless tobacco: Never Used  Vaping Use  . Vaping Use: Never used  Substance and Sexual Activity  . Alcohol use: No  . Drug use: No  . Sexual activity: Yes  Other Topics Concern  . Not on file  Social History Narrative  . Not on file   Social Determinants of Health   Financial Resource Strain:   . Difficulty of Paying Living Expenses: Not on file  Food Insecurity:   . Worried About Charity fundraiser in the Last Year: Not on file  . Ran Out of Food in the Last Year: Not on file  Transportation Needs:   . Lack of Transportation (Medical): Not on file  . Lack of Transportation (Non-Medical): Not on file  Physical Activity:   . Days of Exercise per Week: Not on file  . Minutes of Exercise per Session: Not on file  Stress:   . Feeling of Stress : Not on file  Social Connections:   . Frequency of Communication with Friends and Family: Not on file  . Frequency of Social Gatherings with Friends and Family: Not on file  . Attends Religious Services: Not on file  . Active Member of Clubs or Organizations: Not on file  . Attends Archivist Meetings: Not on file  . Marital Status: Not on file  Intimate Partner Violence:   . Fear of Current or Ex-Partner: Not on file  . Emotionally Abused: Not on file  . Physically Abused: Not on file  . Sexually Abused: Not on file    Family History  Problem Relation Age of Onset  . Heart disease Father   . Hypertension Father   . Breast cancer Sister   . Cancer Sister        breast     Review of Systems  Constitutional: Negative.   HENT: Negative.  Negative for congestion and sore throat.   Respiratory: Negative.  Negative for cough and shortness of breath.   Cardiovascular: Negative.  Negative for chest pain  and palpitations.  Gastrointestinal: Negative.  Negative for abdominal pain, nausea and vomiting.    Genitourinary: Negative.  Negative for dysuria and hematuria.  Musculoskeletal: Negative.  Negative for back pain, myalgias and neck pain.  Skin: Positive for itching and rash.  Neurological: Negative.  Negative for dizziness and headaches.  All other systems reviewed and are negative.  Today's Vitals   07/26/20 1020  BP: 135/73  Pulse: (!) 124  Resp: 16  Temp: 97.9 F (36.6 C)  TempSrc: Temporal  SpO2: 96%  Weight: 255 lb (115.7 kg)  Height: 5' (1.524 m)   Body mass index is 49.8 kg/m.   Physical Exam Vitals reviewed.  Constitutional:      Appearance: Normal appearance.  HENT:     Head: Normocephalic.  Eyes:     Extraocular Movements: Extraocular movements intact.     Pupils: Pupils are equal, round, and reactive to light.  Cardiovascular:     Rate and Rhythm: Normal rate and regular rhythm.  Pulmonary:     Effort: Pulmonary effort is normal.  Musculoskeletal:     Cervical back: Normal range of motion and neck supple.  Skin:    General: Skin is warm and dry.     Comments: Lower legs: Dermatitic rash.  No cellulitis.  Rash from knees to ankles bilaterally  Neurological:     General: No focal deficit present.     Mental Status: She is alert.      ASSESSMENT & PLAN: Jadea was seen today for rash.  Diagnoses and all orders for this visit:  Rash and nonspecific skin eruption -     triamcinolone cream (KENALOG) 0.1 %; Apply 1 application topically 2 (two) times daily.  Contact dermatitis, unspecified contact dermatitis type, unspecified trigger -     triamcinolone cream (KENALOG) 0.1 %; Apply 1 application topically 2 (two) times daily.  Morbid obesity ([place])    Patient Instructions       If you have lab work done today you will be contacted with your lab results within the next 2 weeks.  If you have not heard from [place] then please contact us. The fastest way to get your results is to register for My Chart.   IF you received an x-ray today, you will  receive an invoice from [facility] Radiology. Please contact [facility] Radiology at [phone] with questions or concerns regarding your invoice.   IF you received labwork today, you will receive an invoice from [facility]. Please contact [facility] at [phone] with questions or concerns regarding your invoice.   Our billing staff will not be able to assist you with questions regarding bills from these companies.  You will be contacted with the lab results as soon as they are available. The fastest way to get your results is to activate your My Chart account. Instructions are located on the last page of this paperwork. If you have not heard from [place] regarding the results in 2 weeks, please contact this office.     Eccema Eczema El eccema es un trmino amplio que define un grupo de afecciones dermatolgicas que provocan aspereza e inflamacin de la piel. Cada tipo de eccema tiene diferentes desencadenantes, sntomas y tratamientos. Habitualmente, cualquier tipo de eccema provoca comezn y los sntomas varan de leves a intensos. El eccema y sus sntomas no se transmiten de [place] persona a otra (no es contagioso). Puede aparecer en diferentes partes del cuerpo en distintos momentos. Su eccema puede no ser igual al eccema de otra persona. Cules  son los tipos de eccema? Dermatitis atpica Es una afeccin dermatolgica crnica que siempre vuelve a aparecer (recurrente). Los sntomas comunes son piel seca y granos pequeos y slidos que pueden inflamarse y expulsar lquido (drenar). Dermatitis de contacto  Esto ocurre cuando hay otro factor que irrita la piel y causa la erupcin. La irritacin puede estar provocada por el contacto con sustancias a las que es alrgico, como la hiedra venenosa, o qumicos o medicamentos que se aplic sobre la piel. Eccema dishidrtico Es un tipo de eccema que [employer] manos y los pies. Se presenta como ampollas llenas de lquido que causan mucha comezn. Puede  afectar a personas de cualquier edad, pero es ms comn antes de los 40 aos. Eccema de las manos  Causa comezn en la piel en algunas zonas de las [place], y los laterales de las manos y los dedos. Este tipo de eccema es comn en trabajos industriales donde la persona est expuesta a muchos tipos de Designer, multimedia. Liquen simple crnico Este tipo de [place] se presenta cuando una persona se rasca constantemente una zona del cuerpo. Al rascar repetidamente el mismo lugar, la piel se engrosa (liquenificacin). El liquen simple crnico se puede presentar junto con otros tipos de eccema. Es ms comn en adultos, pero tambin puede presentarse en nios. Eccema numular Es un tipo comn de [place]. No tiene una causa conocida. Habitualmente causa una lesin roja, circular y con una corteza dura (placa) que puede picar. Rascarse puede convertirse en un hbito y causar sangrado. Ocurre ms a menudo en personas de edad media o [employer]. En la [employer] de los [employer]. Dermatitis seborreica Es una afeccin dermatolgica comn que afecta principalmente el cuero cabelludo. Tambin puede [employer] zonas grasosas del cuerpo, como el rostro, los laterales de la nariz, las cejas, las [place], los prpados y Management consultant. Se identifica por el enrojecimiento y [place] pequea descamacin de la piel (eritema). Puede afectar a Engineer, manufacturing de [employer]. En los nios, se la conoce como "dermatitis seborreica infantil". Dermatitis por estasis Esta es una afeccin dermatolgica comn que aparece en las piernas y los pies. Es ms comn en personas que tienen una enfermedad que evita que la sangre bombee a travs de las venas de las piernas (insuficiencia venosa crnica). La dermatitis por estasis es una afeccin crnica que necesita tratamiento a largo plazo. Cmo se diagnostica el eccema? El mdico examinar su piel y revisar sus antecedentes mdicos. Es posible que le hagan pruebas dermatolgicas con  parches. En este tipo de [place], se aplican parches en la espalda que contienen posibles alrgenos. A los pocos das, el mdico lo revisar para ver si hubo Nurse, mental health. Cules son los tratamientos frecuentes? El tratamiento del eccema depende del tipo de eccema que tenga. La hidrocortisona, un medicamento con corticoesteroides, puede aliviar rpidamente la comezn y ayudar a Systems developer. Este medicamento puede ser recetado o de venta libre, de acuerdo a la concentracin del medicamento que se necesite. Siga estas indicaciones en su casa:  Tome los medicamentos de venta libre y los recetados solamente como se lo haya indicado el mdico.  Use cremas y ungentos para Adult nurse piel. No use lociones.  Sepa cules son los desencadenantes o los agentes irritantes que causan los sntomas. Evtelos.  Trate el brote de los sntomas rpidamente.  No se rasque la piel. Esto puede empeorar la erupcin.  Concurra a todas las visitas de control como se lo haya indicado el mdico.  Esto es importante. Dnde encontrar ms informacin  [place] de English as a second language teacher of Dermatology): http://jones-macias.info/  [employer] de [place] (The National eccema Association): www.nationaleczema.org Comunquese con un mdico si:  Tiene comezn intensa, incluso con el tratamiento.  Habitualmente se rasca la piel hasta que sangra.  La erupcin tiene un aspecto diferente del habitual.  La piel le duele, est hinchada o ms roja que lo normal.  Tiene fiebre. Resumen  Existen ocho tipos generales de eccema. Cada uno tiene diferentes desencadenantes.  Cualquier tipo de eccema causa comezn que puede variar de leve a intensa.  El tratamiento vara en funcin del tipo de eccema que tenga. La hidrocortisona, un medicamento con corticoesteroides, puede ayudar a disminuir la inflamacin y Facilities manager.  La mejor manera de evitar el eccema es protegiendo la piel. Use  humectantes y lociones. Evite los desencadenantes y agentes irritantes, y trate los brotes rpidamente. Esta informacin no tiene Marine scientist el consejo del mdico. Asegrese de hacerle al mdico cualquier pregunta que tenga. Document Revised: 05/18/2017 Document Reviewed: 05/18/2017 Elsevier Patient Education  2020 Elsevier Inc.      Agustina Caroli, MD [facility]

## 2020-07-30 ENCOUNTER — Telehealth: Payer: Self-pay | Admitting: Orthopaedic Surgery

## 2020-07-30 NOTE — Telephone Encounter (Signed)
Pt called states the cream for the rash Does,t work she wanted a Different med for the Rash please Advice

## 2020-07-30 NOTE — Telephone Encounter (Signed)
Can you write a new [facility] if that's what's needed?

## 2020-07-30 NOTE — Telephone Encounter (Signed)
Handled on another message

## 2020-07-30 NOTE — Telephone Encounter (Signed)
Pt called wanting to know if we still had the number for whoever was supposed to do her brace fitting? Pt misplaced the number and would like a CB with that information   [phone]

## 2020-07-30 NOTE — Telephone Encounter (Signed)
Pt called in stating the cream you had given her burns when she applies it and is requesting alternative.

## 2020-07-30 NOTE — Telephone Encounter (Signed)
Prescription topical corticosteroid too strong for her skin. Alternative should be over-the-counter hydrocortisone 2% cream. Thanks.

## 2020-07-30 NOTE — Telephone Encounter (Signed)
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

## 2020-07-31 ENCOUNTER — Telehealth: Payer: Self-pay

## 2020-07-31 DIAGNOSIS — G4733 Obstructive sleep apnea (adult) (pediatric): Secondary | ICD-10-CM | POA: Diagnosis not present

## 2020-07-31 DIAGNOSIS — R21 Rash and other nonspecific skin eruption: Secondary | ICD-10-CM

## 2020-07-31 DIAGNOSIS — L259 Unspecified contact dermatitis, unspecified cause: Secondary | ICD-10-CM

## 2020-07-31 NOTE — Telephone Encounter (Signed)
Pt states she has tried this and asked if she needed to see a dermatologist. Placing referral today

## 2020-07-31 NOTE — Telephone Encounter (Signed)
The message taken from her phone call was inaccurate.  Pt was trying to report the legs itching is worse and now there is a burning feeling intermittent that she states feels like it goes to her bones that is not related to the cream pt asking what to do about this

## 2020-07-31 NOTE — Telephone Encounter (Signed)
Try OTC Benadryl cream/lotion.

## 2020-07-31 NOTE — Telephone Encounter (Signed)
Referral for Derm placed for nonspecific rash of bilateral lower legs

## 2020-08-01 ENCOUNTER — Other Ambulatory Visit: Payer: Self-pay

## 2020-08-01 ENCOUNTER — Encounter (HOSPITAL_COMMUNITY): Payer: Self-pay | Admitting: Nurse Practitioner

## 2020-08-01 ENCOUNTER — Ambulatory Visit: Payer: Medicare Other | Admitting: Physical Therapy

## 2020-08-01 ENCOUNTER — Ambulatory Visit (HOSPITAL_COMMUNITY)
Admission: RE | Admit: 2020-08-01 | Discharge: 2020-08-01 | Disposition: A | Discharge status: Home / Self Care | Payer: Medicare Other | Source: Ambulatory Visit | Attending: Nurse Practitioner | Admitting: Nurse Practitioner

## 2020-08-01 VITALS — BP 132/64 | HR 58 | Ht 60.0 in | Wt 257.4 lb

## 2020-08-01 DIAGNOSIS — E1122 Type 2 diabetes mellitus with diabetic chronic kidney disease: Secondary | ICD-10-CM | POA: Insufficient documentation

## 2020-08-01 DIAGNOSIS — I480 Paroxysmal atrial fibrillation: Secondary | ICD-10-CM | POA: Insufficient documentation

## 2020-08-01 DIAGNOSIS — G4733 Obstructive sleep apnea (adult) (pediatric): Secondary | ICD-10-CM | POA: Diagnosis not present

## 2020-08-01 DIAGNOSIS — Z794 Long term (current) use of insulin: Secondary | ICD-10-CM | POA: Diagnosis not present

## 2020-08-01 DIAGNOSIS — I130 Hypertensive heart and chronic kidney disease with heart failure and stage 1 through stage 4 chronic kidney disease, or unspecified chronic kidney disease: Secondary | ICD-10-CM | POA: Insufficient documentation

## 2020-08-01 DIAGNOSIS — I4892 Unspecified atrial flutter: Secondary | ICD-10-CM | POA: Diagnosis not present

## 2020-08-01 DIAGNOSIS — I5032 Chronic diastolic (congestive) heart failure: Secondary | ICD-10-CM | POA: Diagnosis not present

## 2020-08-01 DIAGNOSIS — Z7901 Long term (current) use of anticoagulants: Secondary | ICD-10-CM | POA: Diagnosis not present

## 2020-08-01 DIAGNOSIS — Z79890 Hormone replacement therapy (postmenopausal): Secondary | ICD-10-CM | POA: Diagnosis not present

## 2020-08-01 DIAGNOSIS — D6869 Other thrombophilia: Secondary | ICD-10-CM | POA: Diagnosis not present

## 2020-08-01 DIAGNOSIS — N189 Chronic kidney disease, unspecified: Secondary | ICD-10-CM | POA: Insufficient documentation

## 2020-08-01 DIAGNOSIS — Z79899 Other long term (current) drug therapy: Secondary | ICD-10-CM | POA: Diagnosis not present

## 2020-08-01 DIAGNOSIS — M1710 Unilateral primary osteoarthritis, unspecified knee: Secondary | ICD-10-CM | POA: Insufficient documentation

## 2020-08-01 DIAGNOSIS — R21 Rash and other nonspecific skin eruption: Secondary | ICD-10-CM | POA: Diagnosis not present

## 2020-08-01 NOTE — Progress Notes (Signed)
Primary Care Physician: Horald Pollen, MD Referring Physician:[facility] triage   [name] is a 79 y.o. female with a h/o paroxysmal afib previously on flecainide, dofetilide and most recently on amiodarone. She is being seen today in the afib clinic. She is in SR but does report that she had a ER visit for afib that started around 4 am in the morning with HR's around 150 bpm. She  took her cardizem 120 mg at that time and again at 10  am. She  decided to go to the ER but by the time she was seen, she had returned to [place].   Overall her health has been stable. She had covid in January but she did not require hospitalization.  She remains on amiodarone 200 mg M-F only. TSH/Cmet checked in May  were in [place].  Remains on warfarin with CHA2DS2VASc score of at least 5. No bleeding issues. No further issues with afib.   F/u in afib clinic,07/18/20. She is here as she has had more  afib burden over the last month. SHe was initially seen by A. Lynnell Jude, NP, earlier in the month, amiodarone was increased to 200 mg bid M-Fand then set up for cardioversion. She  was in SR at time of cardioversion so it was cancelled. Then she noted more afib over the last 1-2 days. She was given an appointment here and presents today in [place]. She felt she may have converted late last night or early this am. No known trigger. She is not an ideal  ablation candidate 2/2 morbid obesity.   F/u in afib clinic,07/25/20. She is in afib at 108 bpm. She states that she went into afib Sunday night thru today. She was in SR when I saw her last. Even though her medical record showed she was on amio M-F, and on last visit, she was to take every day, she got confused and only took it M/W/F last week. She is having a very painful L knee that is limiting her ambulation and is to start PT next week.   F/u in afib clinic,08/01/20. She is in SR. She feels for like the last 3 days. She is now on amio 200 mg daily and cardizem 120 mg bid. She feels  improved. She has her ambulation limited by an arthritic knee and is to start PT 10/18. She has also noted a pruritic  rash LE's for the last 2 weeks. A cream that her PCP gave her was not helpful. No other rash. Denies any increased LLE.   Today, she denies symptoms of palpitations, chest pain, shortness of breath, orthopnea, PND, lower extremity edema, dizziness, presyncope, syncope, or neurologic sequela. The patient is tolerating medications without difficulties and is otherwise without complaint today.   Past Medical History:  Diagnosis Date  . Arthritis   . Back pain   . Chronic anticoagulation    due to aflutter  . Chronic kidney disease   . Diabetes mellitus   . Diastolic CHF, chronic ([place])    a.  echo 2006 - ef 55-65%; mild diast dysfxn;    b. Echo 08/2011: Mild LVH, EF 60%;  c. 04/2013 Echo: EF 65-69%, mild conc LVH;  08/2014 Echo: EF 60-65%, mild-mod MR.  . Gout   . Hyperlipidemia   . Hypertension    a.  Renal arterial Dopplers 12/2011: 1-59% right renal artery stenosis  . Morbid obesity ([place])   . Obstructive sleep apnea on CPAP   . Paroxysmal Afib/Flutter  a. 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   Past Surgical History:  Procedure Laterality Date  . APPENDECTOMY    . ATRIAL FLUTTER ABLATION N/A 09/24/2011   Procedure: ATRIAL FLUTTER ABLATION;  Surgeon: [name], MD;  Location[facility] C[facility] LAB;  Service: Cardiovascular;  Laterality: N/A;  . CARDIOVERSION  10/22/2011   Procedure: CARDIOVERSION;  Surgeon: [name], MD;  Location: [place];  Service: Cardiovascular;  Laterality: N/A;  . CARDIOVERSION N/A 09/10/2011   Procedure: CARDIOVERSION;  Surgeon: [name], MD;  Location: [facility] CATH LAB;  Service: Cardiovascular;  Laterality: N/A;  . CHOLECYSTECTOMY    . TONSILLECTOMY  1982  . TOTAL ABDOMINAL HYSTERECTOMY      Current Outpatient Medications  Medication Sig Dispense Refill  . Accu-Chek Softclix Lancets lancets 1 each by Other route 3 (three) times daily. as  directed 100 each 3  . allopurinol (ZYLOPRIM) 300 MG tablet Take 300 mg by mouth daily.     Marland Kitchen amiodarone (PACERONE) 200 MG tablet Take 1 tablet (200 mg total) by mouth daily. 90 tablet 3  . apixaban (ELIQUIS) 5 MG TABS tablet Take 1 tablet (5 mg total) by mouth 2 (two) times daily. 180 tablet 1  . blood glucose meter kit and supplies Dispense based on patient and insurance preference. Use up to four times daily as directed. (FOR ICD-10 E10.9, E11.9). 1 each 0  . cloNIDine (CATAPRES) 0.3 MG tablet Take 1 tablet (0.3 mg total) by mouth 2 (two) times daily. 60 tablet 3  . Continuous Blood Gluc Receiver (FREESTYLE LIBRE 14 DAY READER) DEVI Use as directed. 1 each 0  . diltiazem (CARDIZEM CD) 120 MG 24 hr capsule Take 1 capsule (120 mg total) by mouth in the morning and at bedtime. 30 capsule 3  . Insulin Lispro Prot & Lispro (HUMALOG 75/25 MIX) (75-25) 100 UNIT/ML Kwikpen Inject 15-21 Units into the skin with breakfast, with lunch, and with evening meal. Sliding scale insulin    . levothyroxine (SYNTHROID, LEVOTHROID) 75 MCG tablet Take 75 mcg daily before breakfast by mouth.     . loperamide (IMODIUM) 2 MG capsule Take 1 capsule (2 mg total) by mouth 4 (four) times daily as needed for diarrhea or loose stools. 12 capsule 0  . losartan (COZAAR) 100 MG tablet Take 1 tablet (100 mg total) by mouth daily. 90 tablet 3  . NOVOFINE PLUS 32G X 4 MM MISC 1 each by Subdermal route 2 (two) times daily. as directed 60 each 3  . potassium chloride (KLOR-CON) 10 MEQ tablet Take 2 tablets (20 mEq total) by mouth daily. 180 tablet 3  . RESTASIS MULTIDOSE 0.05 % ophthalmic emulsion Place 1 drop into both eyes 2 (two) times daily.     . rosuvastatin (CRESTOR) 10 MG tablet Take 10 mg by mouth every evening.    . torsemide (DEMADEX) 20 MG tablet Take 2 tablets (40 mg total) by mouth daily. 180 tablet 3  . traMADol (ULTRAM) 50 MG tablet Take 50 mg by mouth in the morning, at noon, and at bedtime.     Marland Kitchen diltiazem  (CARDIZEM) 30 MG tablet Take one tablet every 4 hours as needed for heart rate greater than 100 and blood pressure needs to be above 100 (Patient not taking: Reported on 07/26/2020) 30 tablet 6  . Semaglutide,0.25 or 0.5MG/DOS, (OZEMPIC, 0.25 OR 0.5 MG/DOSE,) 2 MG/1.5ML SOPN Inject 0.75 mLs (1 mg total) into the skin once a week. 12 pen 3  . triamcinolone cream (KENALOG) 0.1 %  Apply 1 application topically 2 (two) times daily. (Patient not taking: Reported on 08/01/2020) 30 g 0   No current facility-administered medications for this encounter.    Allergies  Allergen Reactions  . Metformin Diarrhea    Social History   Socioeconomic History  . Marital status: Married    Spouse name: Not on file  . Number of children: 3  . Years of education: Not on file  . Highest education level: Not on file  Occupational History  . Occupation: DISABILITY/housewife    Employer: RETIRED  Tobacco Use  . Smoking status: Never Smoker  . Smokeless tobacco: Never Used  Vaping Use  . Vaping Use: Never used  Substance and Sexual Activity  . Alcohol use: No  . Drug use: No  . Sexual activity: Yes  Other Topics Concern  . Not on file  Social History Narrative  . Not on file   Social Determinants of Health   Financial Resource Strain:   . Difficulty of Paying Living Expenses: Not on file  Food Insecurity:   . Worried About Charity fundraiser in the Last Year: Not on file  . Ran Out of Food in the Last Year: Not on file  Transportation Needs:   . Lack of Transportation (Medical): Not on file  . Lack of Transportation (Non-Medical): Not on file  Physical Activity:   . Days of Exercise per Week: Not on file  . Minutes of Exercise per Session: Not on file  Stress:   . Feeling of Stress : Not on file  Social Connections:   . Frequency of Communication with Friends and Family: Not on file  . Frequency of Social Gatherings with Friends and Family: Not on file  . Attends Religious Services: Not on  file  . Active Member of Clubs or Organizations: Not on file  . Attends Archivist Meetings: Not on file  . Marital Status: Not on file  Intimate Partner Violence:   . Fear of Current or Ex-Partner: Not on file  . Emotionally Abused: Not on file  . Physically Abused: Not on file  . Sexually Abused: Not on file    Family History  Problem Relation Age of Onset  . Heart disease Father   . Hypertension Father   . Breast cancer Sister   . Cancer Sister        breast    ROS- All systems are reviewed and negative except as per the HPI above  Physical Exam: Vitals:   08/01/20 0946  BP: 132/64  Pulse: (!) 58  Weight: 116.8 kg  Height: 5' (1.524 m)   Wt Readings from Last 3 Encounters:  08/01/20 116.8 kg  07/26/20 115.7 kg  07/25/20 114.4 kg    Labs: Lab Results  Component Value Date   NA 140 07/08/2020   K 3.3 (L) 07/08/2020   CL 97 (L) 07/08/2020   CO2 29 07/08/2020   GLUCOSE 134 (H) 07/08/2020   BUN 23 07/08/2020   CREATININE 0.86 07/08/2020   CALCIUM 9.0 07/08/2020   MG 1.9 02/21/2020   Lab Results  Component Value Date   INR 2.0 06/15/2020   Lab Results  Component Value Date   CHOL 146 02/28/2020   HDL 58 02/28/2020   LDLCALC 52 02/28/2020   TRIG 226 (H) 02/28/2020     GEN- The patient is well appearing, alert and oriented x 3 today.   Head- normocephalic, atraumatic Eyes-  Sclera clear, conjunctiva pink Ears- hearing intact Oropharynx-  clear Neck- supple, no JVP Lymph- no cervical lymphadenopathy Lungs- Clear to ausculation bilaterally, normal work of breathing Heart-  regular rate and rhythm, no murmurs, rubs or gallops, PMI not laterally displaced GI- soft, NT, ND, + BS Extremities- no clubbing, cyanosis, or edema MS- no significant deformity or atrophy Skin- no rash or lesion Psych- euthymic mood, full affect Neuro- strength and sensation are intact  EKG- sinus brady at 58 bpm, with first degree block, pr int 254 ms, qrs int 82  ms, qtc 510 ms  Epic records reviewed Echo-Study Conclusions  - Left ventricle: The cavity size was normal. Systolic function was   normal. The estimated ejection fraction was in the range of 60%   to 65%. Wall motion was normal; there were no regional wall   motion abnormalities. Features are consistent with a pseudonormal   left ventricular filling pattern, with concomitant abnormal   relaxation and increased filling pressure (grade 2 diastolic   dysfunction). Doppler parameters are consistent with high   ventricular filling pressure. - Aortic valve: Transvalvular velocity was within the normal range.   There was no stenosis. There was no regurgitation. - Mitral valve: Transvalvular velocity was within the normal range.   There was no evidence for stenosis. There was mild regurgitation.   Valve area by pressure half-time: 1.79 cm^2. Valve area by   continuity equation (using LVOT flow): 1.1 cm^2. - Left atrium: The atrium was severely dilated. - Right ventricle: The cavity size was normal. Wall thickness was   normal. Systolic function was normal. - Tricuspid valve: There was trivial regurgitation. - Pulmonary arteries: Systolic pressure was within the normal   range. PA peak pressure: 25 mm Hg (S).    Assessment and Plan: 1. Paroxysmal afib Increased burden recently  Now back in rhythm with amiodarone daily at 200 mg ( previously M-F)  and Cardizem 120 mg bid  Can still use cardizem 30 mg as needed    2. Chadsvasc score of at least 5 Continue eliquis 5 mg bid   3. Rash LE's F/u with PCP  4. Limited ambulation 2/2 arthritic knee Pending  starting PT  F/u with Dr. Caryl Comes as scheduled 10/27 afib clinic as needed   Butch Penny C. Abriel Hattery, [facility] [address] [place], [place] [address] [phone]

## 2020-08-02 ENCOUNTER — Telehealth: Payer: Self-pay

## 2020-08-02 NOTE — Telephone Encounter (Signed)
Try lotrisone cream and see if that helps

## 2020-08-02 NOTE — Telephone Encounter (Signed)
Patient called and left a message. She has a terrible rash on her lower leg that is very itchy. She has scratched it to bleeding. Her PCP gave her Kenalog cream but this has not helped and has in fact made it worse. She wants to know if we can suggest something different that might help her.

## 2020-08-02 NOTE — Telephone Encounter (Signed)
Thank you! Spoke to patient and advised. She will keep her appointment with Dr Elisha Ponder on 121/10/21

## 2020-08-02 NOTE — Telephone Encounter (Signed)
Patient had an office visit on 07/11/2020 and she was using the walker with rollers. Called patient left message in voice mail concerning th bedside commode, if she heard anything from the company.

## 2020-08-03 ENCOUNTER — Other Ambulatory Visit (HOSPITAL_COMMUNITY): Payer: Self-pay | Admitting: *Deleted

## 2020-08-03 ENCOUNTER — Telehealth: Payer: Self-pay

## 2020-08-03 MED ORDER — DILTIAZEM HCL ER COATED BEADS 120 MG PO CP24
120.0000 mg | ORAL_CAPSULE | Freq: Two times a day (BID) | ORAL | 3 refills | Status: DC
Start: 2020-08-03 — End: 2020-08-15

## 2020-08-03 NOTE — Telephone Encounter (Signed)
Ross at [employer] called stating that he needs a call back to discuss Rx for custom left knee brace for patient.  Stated that due to measurements, patient is not a candidate for style of brace.  CB# [phone].  Please advise.  Thank you.

## 2020-08-06 ENCOUNTER — Ambulatory Visit (INDEPENDENT_AMBULATORY_CARE_PROVIDER_SITE_OTHER): Payer: Medicare Other | Admitting: Emergency Medicine

## 2020-08-06 ENCOUNTER — Ambulatory Visit: Payer: Medicare Other | Attending: Orthopaedic Surgery

## 2020-08-06 ENCOUNTER — Encounter: Payer: Self-pay | Admitting: Emergency Medicine

## 2020-08-06 ENCOUNTER — Other Ambulatory Visit: Payer: Self-pay

## 2020-08-06 VITALS — BP 112/80 | HR 104 | Temp 97.7°F | Ht 60.0 in | Wt 249.2 lb

## 2020-08-06 DIAGNOSIS — I152 Hypertension secondary to endocrine disorders: Secondary | ICD-10-CM

## 2020-08-06 DIAGNOSIS — Z7901 Long term (current) use of anticoagulants: Secondary | ICD-10-CM

## 2020-08-06 DIAGNOSIS — R262 Difficulty in walking, not elsewhere classified: Secondary | ICD-10-CM | POA: Diagnosis not present

## 2020-08-06 DIAGNOSIS — E1159 Type 2 diabetes mellitus with other circulatory complications: Secondary | ICD-10-CM

## 2020-08-06 DIAGNOSIS — H9311 Tinnitus, right ear: Secondary | ICD-10-CM | POA: Diagnosis not present

## 2020-08-06 DIAGNOSIS — I482 Chronic atrial fibrillation, unspecified: Secondary | ICD-10-CM | POA: Diagnosis not present

## 2020-08-06 DIAGNOSIS — M25561 Pain in right knee: Secondary | ICD-10-CM | POA: Insufficient documentation

## 2020-08-06 DIAGNOSIS — G4733 Obstructive sleep apnea (adult) (pediatric): Secondary | ICD-10-CM

## 2020-08-06 DIAGNOSIS — G8929 Other chronic pain: Secondary | ICD-10-CM | POA: Diagnosis not present

## 2020-08-06 DIAGNOSIS — Z9989 Dependence on other enabling machines and devices: Secondary | ICD-10-CM

## 2020-08-06 DIAGNOSIS — E782 Mixed hyperlipidemia: Secondary | ICD-10-CM

## 2020-08-06 DIAGNOSIS — I5032 Chronic diastolic (congestive) heart failure: Secondary | ICD-10-CM

## 2020-08-06 DIAGNOSIS — E1142 Type 2 diabetes mellitus with diabetic polyneuropathy: Secondary | ICD-10-CM

## 2020-08-06 DIAGNOSIS — M25562 Pain in left knee: Secondary | ICD-10-CM | POA: Diagnosis not present

## 2020-08-06 DIAGNOSIS — M6281 Muscle weakness (generalized): Secondary | ICD-10-CM | POA: Diagnosis not present

## 2020-08-06 DIAGNOSIS — I700 Atherosclerosis of aorta: Secondary | ICD-10-CM

## 2020-08-06 NOTE — Telephone Encounter (Signed)
IC advised.  

## 2020-08-06 NOTE — Patient Instructions (Addendum)
If you have lab work done today you will be contacted with your lab results within the next 2 weeks.  If you have not heard from [place] then please contact us. The fastest way to get your results is to register for My Chart.   IF you received an x-ray today, you will receive an invoice from [facility]. Please contact [facility] at [phone] with questions or concerns regarding your invoice.   IF you received labwork today, you will receive an invoice from [facility]. Please contact [facility] at [phone] with questions or concerns regarding your invoice.   Our billing staff will not be able to assist you with questions regarding bills from these companies.  You will be contacted with the lab results as soon as they are available. The fastest way to get your results is to activate your My Chart account. Instructions are located on the last page of this paperwork. If you have not heard from [place] regarding the results in 2 weeks, please contact this office.      Tinnitus Tinnitus refers to hearing a sound when there is no actual source for that sound. This is often described as ringing in the ears. However, people with this condition may hear a variety of noises, in one ear or in both ears. The sounds of tinnitus can be soft, loud, or somewhere in between. Tinnitus can last for a few seconds or can be constant for days. It may go away without treatment and come back at various times. When tinnitus is constant or happens often, it can lead to other problems, such as trouble sleeping and trouble concentrating. Almost everyone experiences tinnitus at some point. Tinnitus that is long-lasting (chronic) or comes back often (recurs) may require medical attention. What are the causes? The cause of tinnitus is often not known. In some cases, it can result from:  Exposure to loud noises from machinery, music, or other sources.  An object (foreign body) stuck in the ear.  Earwax  buildup.  Drinking alcohol or caffeine.  Taking certain medicines.  Age-related hearing loss. It may also be caused by medical conditions such as:  Ear or sinus infections.  High blood pressure.  Heart diseases.  Anemia.  Allergies.  Meniere's disease.  Thyroid problems.  Tumors.  A weak, bulging blood vessel (aneurysm) near the ear. What are the signs or symptoms? The main symptom of tinnitus is hearing a sound when there is no source for that sound. It may sound like:  Buzzing.  Roaring.  Ringing.  Blowing air.  Hissing.  Whistling.  Sizzling.  Humming.  Running water.  A musical note.  Tapping. Symptoms may affect only one ear (unilateral) or both ears (bilateral). How is this diagnosed? Tinnitus is diagnosed based on your symptoms, your medical history, and a physical exam. Your health care provider may do a thorough hearing test (audiologic exam) if your tinnitus:  Is unilateral.  Causes hearing difficulties.  Lasts 6 months or longer. You may work with a health care provider who specializes in hearing disorders (audiologist). You may be asked questions about your symptoms and how they affect your daily life. You may have other tests done, such as:  CT scan.  MRI.  An imaging test of how blood flows through your blood vessels (angiogram). How is this treated? Treating an underlying medical condition can sometimes make tinnitus go away. If your tinnitus continues, other treatments may include:  Medicines.  Therapy and counseling to help  you manage the stress of living with tinnitus.  Sound generators to mask the tinnitus. These include: ? Tabletop sound machines that play relaxing sounds to help you fall asleep. ? Wearable devices that fit in your ear and play sounds or music. ? Acoustic neural stimulation. This involves using headphones to listen to music that contains an auditory signal. Over time, listening to this signal may change  some pathways in your brain and make you less sensitive to tinnitus. This treatment is used for very severe cases when no other treatment is working.  Using hearing aids or cochlear implants if your tinnitus is related to hearing loss. Hearing aids are worn in the outer ear. Cochlear implants are surgically placed in the inner ear. Follow these instructions at home: Managing symptoms      When possible, avoid being in loud places and being exposed to loud sounds.  Wear hearing protection, such as earplugs, when you are exposed to loud noises.  Use a white noise machine, a humidifier, or other devices to mask the sound of tinnitus.  Practice techniques for reducing stress, such as meditation, yoga, or deep breathing. Work with your health care provider if you need help with managing stress.  Sleep with your head slightly raised. This may reduce the impact of tinnitus. General instructions  Do not use stimulants, such as nicotine, alcohol, or caffeine. Talk with your health care provider about other stimulants to avoid. Stimulants are substances that can make you feel alert and attentive by increasing certain activities in the body (such as heart rate and blood pressure). These substances may make tinnitus worse.  Take over-the-counter and prescription medicines only as told by your health care provider.  Try to get plenty of sleep each night.  Keep all follow-up visits as told by your health care provider. This is important. Contact a health care provider if:  Your tinnitus continues for 3 weeks or longer without stopping.  You develop sudden hearing loss.  Your symptoms get worse or do not get better with home care.  You feel you are not able to manage the stress of living with tinnitus. Get help right away if:  You develop tinnitus after a head injury.  You have tinnitus along with any of the following: ? Dizziness. ? Loss of balance. ? Nausea and vomiting. ? Sudden,  severe headache. These symptoms may represent a serious problem that is an emergency. Do not wait to see if the symptoms will go away. Get medical help right away. Call your local emergency services (911 in the [place] Do not drive yourself to the hospital. Summary  Tinnitus refers to hearing a sound when there is no actual source for that sound. This is often described as ringing in the ears.  Symptoms may affect only one ear (unilateral) or both ears (bilateral).  Use a white noise machine, a humidifier, or other devices to mask the sound of tinnitus.  Do not use stimulants, such as nicotine, alcohol, or caffeine. Talk with your health care provider about other stimulants to avoid. These substances may make tinnitus worse. This information is not intended to replace advice given to you by your health care provider. Make sure you discuss any questions you have with your health care provider. Document Revised: 04/20/2019 Document Reviewed: 07/16/2017 Elsevier Patient Education  2020 [employer].

## 2020-08-06 NOTE — Telephone Encounter (Signed)
Pls advise.  

## 2020-08-06 NOTE — Progress Notes (Signed)
[name] 79 y.o.   Chief Complaint  Patient presents with  . right ear ringing    going on since the 08/02/20    HISTORY OF PRESENT ILLNESS: This is a 79 y.o. female complaining of very loud ringing in her right ear that lasted about 5 seconds 4 days ago.  No associated symptoms.  Has history of similar symptoms in the past and has been evaluated by ENT doctor.  No other complaints or medical concerns today. Has history of chronic atrial fibrillation on long-term anticoagulation with Eliquis.  Also on rhythm and rate control medications.  Takes amiodarone 200 mg daily, Cardizem 120 mg twice a day, and losartan 100 mg for hypertension. Also history of insulin-dependent diabetes on Ozempic as well.   HPI   Prior to Admission medications   Medication Sig Start Date End Date Taking? Authorizing Provider  Accu-Chek Softclix Lancets lancets 1 each by Other route 3 (three) times daily. as directed 03/30/20  Yes Maximiano Coss, NP  allopurinol (ZYLOPRIM) 300 MG tablet Take 300 mg by mouth daily.  07/07/13  Yes [provider]  amiodarone (PACERONE) 200 MG tablet Take 1 tablet (200 mg total) by mouth daily. 07/18/20  Yes Sherran Needs, NP  apixaban (ELIQUIS) 5 MG TABS tablet Take 1 tablet (5 mg total) by mouth 2 (two) times daily. 06/14/20  Yes [name], MD  blood glucose meter kit and supplies Dispense based on patient and insurance preference. Use up to four times daily as directed. (FOR ICD-10 E10.9, E11.9). 05/03/20  Yes [place], Ines Bloomer, MD  cloNIDine (CATAPRES) 0.3 MG tablet Take 1 tablet (0.3 mg total) by mouth 2 (two) times daily. 06/08/20  Yes Posey Boyer, MD  Continuous Blood Gluc Receiver (FREESTYLE LIBRE 14 DAY READER) DEVI Use as directed. 04/18/20  Yes Ethan Kasperski, Ines Bloomer, MD  diltiazem (CARDIZEM CD) 120 MG 24 hr capsule Take 1 capsule (120 mg total) by mouth in the morning and at bedtime. 08/03/20  Yes Sherran Needs, NP  diltiazem (CARDIZEM) 30 MG  tablet Take one tablet every 4 hours as needed for heart rate greater than 100 and blood pressure needs to be above 100 05/09/20  Yes Sherran Needs, NP  Insulin Lispro Prot & Lispro (HUMALOG 75/25 MIX) (75-25) 100 UNIT/ML Kwikpen Inject 15-21 Units into the skin with breakfast, with lunch, and with evening meal. Sliding scale insulin 08/12/16  Yes [provider]  levothyroxine (SYNTHROID, LEVOTHROID) 75 MCG tablet Take 75 mcg daily before breakfast by mouth.  12/29/16  Yes [provider]  loperamide (IMODIUM) 2 MG capsule Take 1 capsule (2 mg total) by mouth 4 (four) times daily as needed for diarrhea or loose stools. 03/20/20  Yes Garald Balding, PA-C  losartan (COZAAR) 100 MG tablet Take 1 tablet (100 mg total) by mouth daily. 05/31/20  Yes Caitlen Worth, Ines Bloomer, MD  NOVOFINE PLUS 32G X 4 MM MISC 1 each by Subdermal route 2 (two) times daily. as directed 03/30/20  Yes Maximiano Coss, NP  potassium chloride (KLOR-CON) 10 MEQ tablet Take 2 tablets (20 mEq total) by mouth daily. 10/18/19  Yes [name], MD  RESTASIS MULTIDOSE 0.05 % ophthalmic emulsion Place 1 drop into both eyes 2 (two) times daily.  03/04/20  Yes [provider]  rosuvastatin (CRESTOR) 10 MG tablet Take 10 mg by mouth every evening.   Yes [provider]  Semaglutide,0.25 or 0.5MG/DOS, (OZEMPIC, 0.25 OR 0.5 MG/DOSE,) 2 MG/1.5ML SOPN Inject 0.75  mLs (1 mg total) into the skin once a week. 04/18/20  Yes Leanard Dimaio, Ines Bloomer, MD  torsemide (DEMADEX) 20 MG tablet Take 2 tablets (40 mg total) by mouth daily. 09/29/19  Yes [name], MD  traMADol (ULTRAM) 50 MG tablet Take 50 mg by mouth in the morning, at noon, and at bedtime.    Yes [provider]  triamcinolone cream (KENALOG) 0.1 % Apply 1 application topically 2 (two) times daily. 07/26/20  Yes Horald Pollen, MD    Allergies  Allergen Reactions  . Metformin Diarrhea    Patient Active Problem List   Diagnosis Date  Noted  . Type 2 diabetes mellitus with hyperglycemia, with long-term current use of insulin ([place]) 06/21/2020  . Type 2 diabetes mellitus with diabetic polyneuropathy, with long-term current use of insulin ([place]) 06/21/2020  . Mixed hyperlipidemia 06/21/2020  . Uncontrolled hypertension 06/11/2020  . Aortic atherosclerosis ([place]) 03/28/2020  . Diabetic peripheral neuropathy ([place]) 12/26/2019  . Obesity 12/19/2015  . Hypersomnia 12/19/2015  . Acute diastolic congestive heart failure, NYHA class 3 ([place]) 10/03/2013  . Prolonged QT interval 10/03/2013  . Acute diastolic heart failure ([place]) 10/03/2013  . Long term (current) use of anticoagulants 06/09/2012  . BENIGN NEOPLASM OF ADRENAL GLAND 11/25/2010  . Chronic diastolic heart failure ([place]) 02/20/2010  . DM 05/16/2009  . GOUT 05/16/2009  . OBESITY, MORBID 05/16/2009  . Hypertension associated with diabetes ([place]) 05/16/2009  . Paroxysmal atrial fibrillation ([place]) 05/16/2009  . HYPERLIPIDEMIA 11/30/2008  . Obstructive sleep apnea on CPAP 11/30/2008    Past Medical History:  Diagnosis Date  . Arthritis   . Back pain   . Chronic anticoagulation    due to aflutter  . Chronic kidney disease   . Diabetes mellitus   . Diastolic CHF, chronic ([place])    a.  echo 2006 - ef 55-65%; mild diast dysfxn;    b. Echo 08/2011: Mild LVH, EF 60%;  c. 04/2013 Echo: EF 65-69%, mild conc LVH;  08/2014 Echo: EF 60-65%, mild-mod MR.  . Gout   . Hyperlipidemia   . Hypertension    a.  Renal arterial Dopplers 12/2011: 1-59% right renal artery stenosis  . Morbid obesity ([place])   . Obstructive sleep apnea on CPAP   . Paroxysmal Afib/Flutter    a. 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 APPENDECTOMY    . ATRIAL FLUTTER ABLATION N/A 09/24/2011   Procedure: ATRIAL FLUTTER ABLATION;  Surgeon: [name], MD;  Location: [facility] CATH LAB;  Service: Cardiovascular;  Laterality: N/A;  . CARDIOVERSION  10/22/2011    Procedure: CARDIOVERSION;  Surgeon: [name], MD;  Location: [place];  Service: Cardiovascular;  Laterality: N/A;  . CARDIOVERSION N/A 09/10/2011   Procedure: CARDIOVERSION;  Surgeon: [name], MD;  Location: [facility] CATH LAB;  Service: Cardiovascular;  Laterality: N/A;  . CHOLECYSTECTOMY    . TONSILLECTOMY  1982  . TOTAL ABDOMINAL HYSTERECTOMY      Social History   Socioeconomic History  . Marital status: Married    Spouse name: Not on file  . Number of children: 3  . Years of education: Not on file  . Highest education level: Not on file  Occupational History  . Occupation: DISABILITY/housewife    Employer: RETIRED  Tobacco Use  . Smoking status: Never Smoker  . Smokeless tobacco: Never Used  Vaping Use  . Vaping Use: Never used  Substance and Sexual Activity  .  Alcohol use: No  . Drug use: No  . Sexual activity: Yes  Other Topics Concern  . Not on file  Social History Narrative  . Not on file   Social Determinants of Health   Financial Resource Strain:   . Difficulty of Paying Living Expenses: Not on file  Food Insecurity:   . Worried About Charity fundraiser in the Last Year: Not on file  . Ran Out of Food in the Last Year: Not on file  Transportation Needs:   . Lack of Transportation (Medical): Not on file  . Lack of Transportation (Non-Medical): Not on file  Physical Activity:   . Days of Exercise per Week: Not on file  . Minutes of Exercise per Session: Not on file  Stress:   . Feeling of Stress : Not on file  Social Connections:   . Frequency of Communication with Friends and Family: Not on file  . Frequency of Social Gatherings with Friends and Family: Not on file  . Attends Religious Services: Not on file  . Active Member of Clubs or Organizations: Not on file  . Attends Archivist Meetings: Not on file  . Marital Status: Not on file  Intimate Partner Violence:   . Fear of Current or Ex-Partner: Not on file  . Emotionally Abused: Not  on file  . Physically Abused: Not on file  . Sexually Abused: Not on file    Family History  Problem Relation Age of Onset  . Heart disease Father   . Hypertension Father   . Breast cancer Sister   . Cancer Sister        breast     Review of Systems  Constitutional: Negative.  Negative for chills and fever.  HENT: Positive for tinnitus. Negative for congestion, ear pain, hearing loss, nosebleeds and sore throat.   Eyes: Negative for blurred vision, double vision and pain.  Respiratory: Negative.  Negative for cough and shortness of breath.   Cardiovascular: Positive for palpitations. Negative for chest pain and leg swelling.  Gastrointestinal: Negative.  Negative for abdominal pain, diarrhea, nausea and vomiting.  Genitourinary: Negative.  Negative for dysuria and hematuria.  Musculoskeletal: Negative.  Negative for back pain, myalgias and neck pain.  Skin: Negative.  Negative for rash.  Neurological: Negative.  Negative for dizziness and headaches.  All other systems reviewed and are negative.   Today's Vitals   08/06/20 0915  BP: 112/80  Pulse: (!) 104  Temp: 97.7 F (36.5 C)  TempSrc: Temporal  SpO2: 97%  Weight: 249 lb 3.2 oz (113 kg)  Height: 5' (1.524 m)   Body mass index is 48.67 kg/m.  Physical Exam Constitutional:      Appearance: She is obese.  HENT:     Head: Normocephalic.     Right Ear: Tympanic membrane, ear canal and external ear normal.     Left Ear: Tympanic membrane, ear canal and external ear normal.  Eyes:     Extraocular Movements: Extraocular movements intact.     Conjunctiva/sclera: Conjunctivae normal.     Pupils: Pupils are equal, round, and reactive to light.  Cardiovascular:     Rate and Rhythm: Normal rate. Rhythm irregular.     Heart sounds: Normal heart sounds.     Comments: Atrial fibrillation rhythm Pulmonary:     Effort: Pulmonary effort is normal.     Breath sounds: Normal breath sounds.  Musculoskeletal:     Cervical  back: Normal range of motion and  neck supple.     Right lower leg: No edema.     Left lower leg: No edema.  Skin:    General: Skin is warm and dry.     Capillary Refill: Capillary refill takes less than 2 seconds.  Neurological:     General: No focal deficit present.     Mental Status: She is alert and oriented to person, place, and time.  Psychiatric:        Mood and Affect: Mood normal.        Behavior: Behavior normal.      ASSESSMENT & PLAN: Clinically stable.  No medical concerns identified during this visit.  No symptoms or signs of a stroke.  Continue present medications.  No changes.  [place] was seen today for right ear ringing.  Diagnoses and all orders for this visit:  Tinnitus of right ear Comments: Chronic  Chronic atrial fibrillation ([place])  Morbid obesity ([place])  Hypertension associated with diabetes ([place])  Chronic diastolic heart failure ([facility])  Aortic atherosclerosis ([place])  Diabetic polyneuropathy associated with type 2 diabetes mellitus ([place])  Long term (current) use of anticoagulants  Mixed hyperlipidemia  Obstructive sleep apnea on CPAP    Patient Instructions       If you have lab work done today you will be contacted with your lab results within the next 2 weeks.  If you have not heard from [place] then please contact us. The fastest way to get your results is to register for My Chart.   IF you received an x-ray today, you will receive an invoice from [facility]. Please contact [facility] at [phone] with questions or concerns regarding your invoice.   IF you received labwork today, you will receive an invoice from [facility]. Please contact [facility] at [phone] with questions or concerns regarding your invoice.   Our billing staff will not be able to assist you with questions regarding bills from these companies.  You will be contacted with the lab results as soon as they are available. The fastest way to get your  results is to activate your My Chart account. Instructions are located on the last page of this paperwork. If you have not heard from [place] regarding the results in 2 weeks, please contact this office.      Tinnitus Tinnitus refers to hearing a sound when there is no actual source for that sound. This is often described as ringing in the ears. However, people with this condition may hear a variety of noises, in one ear or in both ears. The sounds of tinnitus can be soft, loud, or somewhere in between. Tinnitus can last for a few seconds or can be constant for days. It may go away without treatment and come back at various times. When tinnitus is constant or happens often, it can lead to other problems, such as trouble sleeping and trouble concentrating. Almost everyone experiences tinnitus at some point. Tinnitus that is long-lasting (chronic) or comes back often (recurs) may require medical attention. What are the causes? The cause of tinnitus is often not known. In some cases, it can result from:  Exposure to loud noises from machinery, music, or other sources.  An object (foreign body) stuck in the ear.  Earwax buildup.  Drinking alcohol or caffeine.  Taking certain medicines.  Age-related hearing loss. It may also be caused by medical conditions such as:  Ear or sinus infections.  High blood pressure.  Heart diseases.  Anemia.  Allergies.  Meniere's disease.  Thyroid problems.  Tumors.  A weak, bulging blood vessel (aneurysm) near the ear. What are the signs or symptoms? The main symptom of tinnitus is hearing a sound when there is no source for that sound. It may sound like:  Buzzing.  Roaring.  Ringing.  Blowing air.  Hissing.  Whistling.  Sizzling.  Humming.  Running water.  A musical note.  Tapping. Symptoms may affect only one ear (unilateral) or both ears (bilateral). How is this diagnosed? Tinnitus is diagnosed based on your symptoms, your  medical history, and a physical exam. Your health care provider may do a thorough hearing test (audiologic exam) if your tinnitus:  Is unilateral.  Causes hearing difficulties.  Lasts 6 months or longer. You may work with a health care provider who specializes in hearing disorders (audiologist). You may be asked questions about your symptoms and how they affect your daily life. You may have other tests done, such as:  CT scan.  MRI.  An imaging test of how blood flows through your blood vessels (angiogram). How is this treated? Treating an underlying medical condition can sometimes make tinnitus go away. If your tinnitus continues, other treatments may include:  Medicines.  Therapy and counseling to help you manage the stress of living with tinnitus.  Sound generators to mask the tinnitus. These include: ? Tabletop sound machines that play relaxing sounds to help you fall asleep. ? Wearable devices that fit in your ear and play sounds or music. ? Acoustic neural stimulation. This involves using headphones to listen to music that contains an auditory signal. Over time, listening to this signal may change some pathways in your brain and make you less sensitive to tinnitus. This treatment is used for very severe cases when no other treatment is working.  Using hearing aids or cochlear implants if your tinnitus is related to hearing loss. Hearing aids are worn in the outer ear. Cochlear implants are surgically placed in the inner ear. Follow these instructions at home: Managing symptoms      When possible, avoid being in loud places and being exposed to loud sounds.  Wear hearing protection, such as earplugs, when you are exposed to loud noises.  Use a white noise machine, a humidifier, or other devices to mask the sound of tinnitus.  Practice techniques for reducing stress, such as meditation, yoga, or deep breathing. Work with your health care provider if you need help with  managing stress.  Sleep with your head slightly raised. This may reduce the impact of tinnitus. General instructions  Do not use stimulants, such as nicotine, alcohol, or caffeine. Talk with your health care provider about other stimulants to avoid. Stimulants are substances that can make you feel alert and attentive by increasing certain activities in the body (such as heart rate and blood pressure). These substances may make tinnitus worse.  Take over-the-counter and prescription medicines only as told by your health care provider.  Try to get plenty of sleep each night.  Keep all follow-up visits as told by your health care provider. This is important. Contact a health care provider if:  Your tinnitus continues for 3 weeks or longer without stopping.  You develop sudden hearing loss.  Your symptoms get worse or do not get better with home care.  You feel you are not able to manage the stress of living with tinnitus. Get help right away if:  You develop tinnitus after a head injury.  You have tinnitus along with any of the  following: ? Dizziness. ? Loss of balance. ? Nausea and vomiting. ? Sudden, severe headache. These symptoms may represent a serious problem that is an emergency. Do not wait to see if the symptoms will go away. Get medical help right away. Call your local emergency services (911 in the [place] Do not drive yourself to the hospital. Summary  Tinnitus refers to hearing a sound when there is no actual source for that sound. This is often described as ringing in the ears.  Symptoms may affect only one ear (unilateral) or both ears (bilateral).  Use a white noise machine, a humidifier, or other devices to mask the sound of tinnitus.  Do not use stimulants, such as nicotine, alcohol, or caffeine. Talk with your health care provider about other stimulants to avoid. These substances may make tinnitus worse. This information is not intended to replace advice given  to you by your health care provider. Make sure you discuss any questions you have with your health care provider. Document Revised: 04/20/2019 Document Reviewed: 07/16/2017 Elsevier Patient Education  2020 Elsevier Inc.    Agustina Caroli, MD [facility]

## 2020-08-06 NOTE — Telephone Encounter (Signed)
I do not really need to call due to her body habitus and obesity, and if they cannot fit her for a brace, that is all that we can do.

## 2020-08-06 NOTE — Therapy (Signed)
[place], [place], [address] Phone: [phone]   Fax:  [phone]  Physical Therapy Evaluation  Patient Details  Name: [name] MRN: [id number] Date of Birth: 02-Dec-1940 Referring Provider (PT): Mcarthur Rossetti, MD   Encounter Date: 08/06/2020   PT End of Session - 08/06/20 1608    Visit Number 1    Number of Visits 9    Date for PT Re-Evaluation 09/22/20    Authorization Type [employer] Medicare - FOTO visit 6 and 10; PN visit 10; KX visit 15    PT Start Time 1615    PT Stop Time 1700    PT Time Calculation (min) 45 min    Activity Tolerance Patient limited by fatigue;Other (comment)   see "clinical impression statement" in plan section   Behavior During Therapy WFL for tasks assessed/performed           Past Medical History:  Diagnosis Date   Arthritis    Back pain    Chronic anticoagulation    due to aflutter   Chronic kidney disease    Diabetes mellitus    Diastolic CHF, chronic ([place])    a.  echo 2006 - ef 55-65%; mild diast dysfxn;    b. Echo 08/2011: Mild LVH, EF 60%;  c. 04/2013 Echo: EF 65-69%, mild conc LVH;  08/2014 Echo: EF 60-65%, mild-mod MR.   Gout    Hyperlipidemia    Hypertension    a.  Renal arterial Dopplers 12/2011: 1-59% right renal artery stenosis   Morbid obesity ([place])    Obstructive sleep apnea on CPAP    Paroxysmal Afib/Flutter    a. 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    Past Surgical History:  Procedure Laterality Date   APPENDECTOMY     ATRIAL FLUTTER ABLATION N/A 09/24/2011   Procedure: ATRIAL FLUTTER ABLATION;  Surgeon: [name], MD;  Location: [facility] CATH LAB;  Service: Cardiovascular;  Laterality: N/A;   CARDIOVERSION  10/22/2011   Procedure: CARDIOVERSION;  Surgeon: [name], MD;  Location: [facility];  Service: Cardiovascular;  Laterality: N/A;   CARDIOVERSION N/A 09/10/2011   Procedure: CARDIOVERSION;  Surgeon: [name], MD;  Location: [facility] CATH LAB;  Service: Cardiovascular;  Laterality: N/A;   CHOLECYSTECTOMY     TONSILLECTOMY  1982   TOTAL ABDOMINAL HYSTERECTOMY      There were no vitals filed for this visit.    Subjective Assessment - 08/06/20 1618    Subjective Patient fell on 06/22/2020 and 06/25/2020 while in [place]. Patient missed a step during the first step and was trying to grab her suitcase after it rolled away during the second fall. "Doctor said I have arthritis that is worse on my left side." Patient reports attempting to receive knee brace from [employer] per doctor's request but was told it would not be effective due to L knee brace sliding down LLE because of L thigh size. Pt states she was given an option for a L knee brace that would extend from thigh to foot, but she would not be able to use that because it would impact her ability to perform desired daily functions, such as driving. Pt explains that her doctor is working on finding a solution/brace that fits her needs. Pt began using rolling walker for ambulation after the two falls in September 2021.    Pertinent History See extensive PMH above    Limitations Standing;Walking;House hold activities    How long can  you sit comfortably? No difficulty with sitting    How long can you stand comfortably? "10 minutes maybe but not much"    How long can you walk comfortably? "can not walk much at all"    Diagnostic tests L knee radiograph 07/04/2020: Mild-to-moderate degenerative changes.  No acute bony abnormality.    Patient Stated Goals Being able to wear different shoes, walking, household activities    Currently in Pain? Other (Comment)    Pain Score 0-No pain   no pain but complaints of instability   Pain Location Knee    Pain Orientation Right;Left;Other (Comment)   Left > Right   Pain Descriptors / Indicators Other (Comment)   feels instability   Pain Type Other (Comment);Chronic pain   increased instability since the past 2 months    Pain Onset More than a month ago    Aggravating Factors  Patient denies pain but states L knee gives out when she least expects it    Pain Relieving Factors Patient denies pain but states L knee gives out when she least expects it - fear of falling    Effect of Pain on Daily Activities Fear of falling and difficulty performing activities due to knee instabilities              [facility] PT Assessment - 08/06/20 1607      Assessment   Medical Diagnosis Chronic pain of both knees M25.561, M25.562, G89.29    Referring Provider (PT) Mcarthur Rossetti, MD    Onset Date/Surgical Date 06/22/20    Hand Dominance Right    Next MD Visit Unsure - sometime in December 2021    Prior Therapy No      Precautions   Precautions None      Restrictions   Weight Bearing Restrictions No      Balance Screen   Has the patient fallen in the past 6 months Yes    How many times? 2    Has the patient had a decrease in activity level because of a fear of falling?  Yes    Is the patient reluctant to leave their home because of a fear of falling?  No      Home Environment   Living Environment Private residence    Living Arrangements Spouse/significant other   husband   Available Help at Discharge Family    Type of [place] to enter    Entrance Stairs-Number of Steps 3    Entrance Stairs-Rails Can reach both    [place] One level    [place] - 2 wheels      Prior Function   Level of Independence Needs assistance with ADLs;Other (comment)   pt's husband assists with bathing (drying), cooking,cleaning   Vocation Retired    Leisure wants to return to being able to wear different shoes again, walking, household activities      Cognition   Overall Cognitive Status Within Functional Limits for tasks assessed      Observation/Other Assessments   Observations Larger thighs bilaterally that increase difficulty with BLE mobility.     Skin Integrity Increased  sensitivity - Pt with pruitic rash on BLE per visual inspection and cardio encounter from 08/01/20. Pt reports no change in rash with use of cream.    Focus on Therapeutic Outcomes (FOTO)  71% limited; predicted 47% limited      Functional Tests   Functional tests Other  ROM / Strength   AROM / PROM / Strength AROM;PROM;Strength      AROM   AROM Assessment Site Knee    Right/Left Knee Right;Left    Right Knee Extension 0    Right Knee Flexion 120    Left Knee Extension 0    Left Knee Flexion 75      PROM   PROM Assessment Site Knee      Strength   Overall Strength Comments Resistance applied at shoe/foot due to increased sensitivity along BLE secondary to rashes.    Strength Assessment Site Knee    Right/Left Knee Right;Left    Right Knee Flexion 4-/5    Right Knee Extension 4-/5    Left Knee Flexion 3+/5    Left Knee Extension 4-/5      Palpation   Patella mobility TTP along left patella and lateral aspect of L knee    Palpation comment Objective assessment limited due to complaints from patient that symptom reproduction in her knee during palpation and initiation of PROM is making her feel like "my heart is feeling worse with my afib." Pt demonstrated increased rate of breathing - PT advised pt to perform deep breathing exercises and measured HR of 97 bpm. Pt expressed feeling better after returning to sitting at EOM from long sit position.      High Level Balance   High Level Balance Comments Patient able to stand with narrow base of support (feet together) for 15 seconds without loss of balance. Pt unable to perform tandem balance without requiring minA and BUE support on RW to regain balance                      Objective measurements completed on examination: See above findings.       [place] [facility] PT Treatment/Exercise - 08/06/20 0001      Self-Care   Self-Care Other Self-Care Comments    Other Self-Care Comments  Pt education provided regarding  anatomy of condition, HEP, and FOTO score. Education about BLE strengthening along chain to provide increased stability to decrease falls and improve function.      Exercises   Exercises Knee/Hip      Knee/Hip Exercises: Seated   Other Seated Knee/Hip Exercises Seated LAQ with SLR to tolerated range 5x; seated quad sets 5x    Other Seated Knee/Hip Exercises Seated alternating marches 5x      Knee/Hip Exercises: Supine   Other Supine Knee/Hip Exercises Pt reports not being able to lay flat without discomfort and difficulty breathing.      Manual Therapy   Manual Therapy Soft tissue mobilization;Passive ROM    Manual therapy comments Attempted STM and PROM briefly - see clinical impression statement in "plan" section                  PT Education - 08/06/20 1637    Education Details Pt education provided regarding anatomy of condition, HEP, and FOTO score. Education about BLE strengthening along chain to provide increased stability to decrease falls and improve function.    Person(s) Educated Patient    Methods Explanation;Demonstration;Tactile cues;Verbal cues    Comprehension Verbalized understanding;Returned demonstration            PT Short Term Goals - 08/06/20 1748      PT SHORT TERM GOAL #1   Title Patient will be able to perform tandem standing for at least 5 seconds without LOB with RW within reach for BUE support  if needed for safety.    Baseline Pt unable to perform tandem stance without requiring minA and use of BUE to maintain balance.    Time 3    Period Weeks    Status New    Target Date 08/27/20      PT SHORT TERM GOAL #2   Title Patient will be able to tolerate standing for at least 15 minutes without requiring seated rest break.    Baseline Pt can "stand maybe 10 minutes"    Time 3    Period Weeks    Status New    Target Date 08/27/20      PT SHORT TERM GOAL #3   Title Patient will be independent with initial HEP.    Baseline Pt given initial  HEP at evaluation on 08/06/2020.    Time 3    Period Weeks    Status New    Target Date 08/27/20      PT SHORT TERM GOAL #4   Title Patient will be able to walk at least 15 minutes with RW on level ground without loss of balance or requiring seated rest break.    Baseline Pt unable to walk more than 10 minutes at a time with rolling walker.    Time 3    Period Weeks    Status New    Target Date 08/27/20             PT Long Term Goals - 08/06/20 1752      PT LONG TERM GOAL #1   Title Patient will be able to ambulate at least 30 minutes with RW on level ground without LOB or seated rest break.    Baseline Pt unable to walk more than 10 minutes at a time with rolling walker.    Time 6    Period Weeks    Status New    Target Date 09/17/20      PT LONG TERM GOAL #2   Title Patient will be able to stand for at least 30 minutes without requiring seated rest break.    Baseline Pt can "stand maybe 10 minutes"    Time 6    Period Weeks    Status New    Target Date 09/17/20      PT LONG TERM GOAL #3   Title Patient will be independent with advanced HEP.    Baseline Pt given initial HEP at evaluation on 08/06/2020.    Time 6    Period Weeks    Status New    Target Date 09/17/20      PT LONG TERM GOAL #4   Title Patient will improve FOTO from 79% limited to 65% limited or better.    Baseline 71% limited; predicted 47% limited    Time 6    Period Weeks    Status New    Target Date 09/17/20                  Plan - 08/06/20 1640    Clinical Impression Statement Patient reports to OPPT with complaints of bilateral knee instability that is worse in LLE. Patient demonstrates decreased cadence and step length with use of rolling walker. Pt has decreased active L knee flexion compared to R knee flexion with pain/tenderness during palpation along lateral aspect of L knee but has no complaints of pain at rest/upon arrival. Pt's L knee flexion MMT is decreased compared to RLE  as well. Objective assessment limited due to complaints  from patient that symptom reproduction in her knee during palpation and initiation of PROM is making her feel like "my heart is feeling worse with my afib." Pt demonstrated increased rate of breathing - PT advised pt to perform deep breathing exercises and measured HR of 97 bpm. Pt expressed feeling better after returning to sitting at EOM from long sit position. Pt will benefit from skilled PT intervention to increase AROM, hip/knee strength, and endurance to allow for improved functional independence.    Personal Factors and Comorbidities Comorbidity 1;Comorbidity 2;Comorbidity 3+;Age;Fitness    Comorbidities See extensive PMH above    Examination-Activity Limitations Bathing;Squat;Stairs;Stand    Examination-Participation Restrictions Cleaning;Shop    Stability/Clinical Decision Making Evolving/Moderate complexity    Clinical Decision Making Moderate    Rehab Potential Fair    PT Frequency 2x / week    PT Duration 6 weeks    PT Treatment/Interventions ADLs/Self Care Home Management;Cryotherapy;Electrical Stimulation;Moist Heat;Neuromuscular re-education;Balance training;Therapeutic exercise;Therapeutic activities;Functional mobility training;Gait training;Stair training;Patient/family education;Passive range of motion;Taping;Vasopneumatic Device;Dry needling;Energy conservation    PT Next Visit Plan Review HEP, hip/knee strengthening, TUG to objectively assess fall risk, balance activities, PROM and STM per tolerance    PT Home Exercise Plan [facility] - seated alternating marches, seated quad sets, seated LAQ with SLR    Consulted and Agree with Plan of Care Patient           Patient will benefit from skilled therapeutic intervention in order to improve the following deficits and impairments:  Abnormal gait, Decreased activity tolerance, Decreased balance, Decreased mobility, Decreased strength, Decreased endurance, Decreased range of  motion, Difficulty walking  Visit Diagnosis: Chronic pain of right knee - Plan: PT plan of care cert/re-cert  Chronic pain of left knee - Plan: PT plan of care cert/re-cert  Difficulty in walking, not elsewhere classified - Plan: PT plan of care cert/re-cert  Muscle weakness (generalized) - Plan: PT plan of care cert/re-cert     Problem List Patient Active Problem List   Diagnosis Date Noted   Type 2 diabetes mellitus with hyperglycemia, with long-term current use of insulin ([place]) 06/21/2020   Type 2 diabetes mellitus with diabetic polyneuropathy, with long-term current use of insulin ([place]) 06/21/2020   Mixed hyperlipidemia 06/21/2020   Uncontrolled hypertension 06/11/2020   Aortic atherosclerosis ([place]) 03/28/2020   Diabetic peripheral neuropathy ([place]) 12/26/2019   Obesity 12/19/2015   Hypersomnia 47/65/4650   Acute diastolic congestive heart failure, NYHA class 3 ([place]) 10/03/2013   Prolonged QT interval 35/46/5681   Acute diastolic heart failure ([place]) 10/03/2013   Long term (current) use of anticoagulants 06/09/2012   BENIGN NEOPLASM OF ADRENAL GLAND 11/25/2010   Chronic diastolic heart failure ([place]) 02/20/2010   DM 05/16/2009   GOUT 05/16/2009   OBESITY, MORBID 05/16/2009   Hypertension associated with diabetes ([place]) 05/16/2009   Paroxysmal atrial fibrillation ([employer]) 05/16/2009   HYPERLIPIDEMIA 11/30/2008   Obstructive sleep apnea on CPAP 11/30/2008     Haydee Monica, PT, DPT 08/06/20 6:03 PM  [facility] [address] [place], [place], [address] Phone: [phone]   Fax:  [phone]  Name: [name] MRN: [id number] Date of Birth: [birth year]

## 2020-08-12 DIAGNOSIS — I4819 Other persistent atrial fibrillation: Secondary | ICD-10-CM | POA: Insufficient documentation

## 2020-08-15 ENCOUNTER — Encounter: Payer: Self-pay | Admitting: Internal Medicine

## 2020-08-15 ENCOUNTER — Ambulatory Visit (INDEPENDENT_AMBULATORY_CARE_PROVIDER_SITE_OTHER): Payer: Medicare Other | Admitting: Internal Medicine

## 2020-08-15 ENCOUNTER — Other Ambulatory Visit: Payer: Self-pay

## 2020-08-15 ENCOUNTER — Telehealth: Payer: Self-pay | Admitting: Internal Medicine

## 2020-08-15 VITALS — BP 138/70 | HR 60 | Ht 60.0 in | Wt 256.0 lb

## 2020-08-15 DIAGNOSIS — I4819 Other persistent atrial fibrillation: Secondary | ICD-10-CM | POA: Diagnosis not present

## 2020-08-15 DIAGNOSIS — I5032 Chronic diastolic (congestive) heart failure: Secondary | ICD-10-CM | POA: Diagnosis not present

## 2020-08-15 DIAGNOSIS — Z79899 Other long term (current) drug therapy: Secondary | ICD-10-CM

## 2020-08-15 LAB — BASIC METABOLIC PANEL
BUN/Creatinine Ratio: 20 (ref 12–28)
BUN: 17 mg/dL (ref 8–27)
CO2: 28 mmol/L (ref 20–29)
Calcium: 9.1 mg/dL (ref 8.7–10.3)
Chloride: 95 mmol/L — ABNORMAL LOW (ref 96–106)
Creatinine, Ser: 0.87 mg/dL (ref 0.57–1.00)
GFR calc Af Amer: 75 mL/min/{1.73_m2} (ref >59)
GFR calc non Af Amer: 65 mL/min/{1.73_m2} (ref >59)
Glucose: 142 mg/dL — ABNORMAL HIGH (ref 65–99)
Potassium: 3.3 mmol/L — ABNORMAL LOW (ref 3.5–5.2)
Sodium: 140 mmol/L (ref 134–144)

## 2020-08-15 MED ORDER — VERAPAMIL HCL 120 MG PO TABS: 120.0000 mg | ORAL_TABLET | Freq: Two times a day (BID) | ORAL | 3 refills | Status: DC

## 2020-08-15 NOTE — Patient Instructions (Signed)
Medication Instructions:   ** Stop Diltiazem  ** Begin Verapamil 120 mg 1 tablet by mouth twice daily  *If you need a refill on your cardiac medications before your next appointment, please call your pharmacy*   Lab Work: BMET today  If you have labs (blood work) drawn today and your tests are completely normal, you will receive your results only by: Marland Kitchen MyChart Message (if you have MyChart) OR . A paper copy in the mail If you have any lab test that is abnormal or we need to change your treatment, we will call you to review the results.   Testing/Procedures: None ordered.    Follow-Up: At [facility], you and your health needs are our priority.  As part of our continuing mission to provide you with exceptional heart care, we have created designated Provider Care Teams.  These Care Teams include your primary Cardiologist (physician) and Advanced Practice Providers (APPs -  Physician Assistants and Nurse Practitioners) who all work together to provide you with the care you need, when you need it.  We recommend signing up for the patient portal called "MyChart".  Sign up information is provided on this After Visit Summary.  MyChart is used to connect with patients for Virtual Visits (Telemedicine).  Patients are able to view lab/test results, encounter notes, upcoming appointments, etc.  Non-urgent messages can be sent to your provider as well.   To learn more about what you can do with MyChart, go to [email or web address].    Your next appointment:   6 month(s)  The format for your next appointment:   In Person  Provider:   [name], MD   Other Instructions Follow up with Dr Eugene Garnet for sleep apnea

## 2020-08-15 NOTE — Telephone Encounter (Signed)
    Pt c/o medication issue:  1. Name of Medication:   verapamil (CALAN) 120 MG tablet   Diltiazem 120 mg  2. How are you currently taking this medication (dosage and times per day)?   3. Are you having a reaction (difficulty breathing--STAT)?   4. What is your medication issue? Pt said as soon as she went home her Afib started again, she also forgot to ask about her medication. She would like to ask Dr. Caryl Comes if she can take her diltiazem with her new prescription verapamil. She said she feels like nothing is working with her afib and she is getting frustrated

## 2020-08-15 NOTE — Progress Notes (Signed)
Patient Care Team: Horald Pollen, MD as PCP - General (Internal Medicine) [name], MD as PCP - Electrophysiology (Cardiology) Shawnee Knapp, MD as Resident (Family Medicine)   HPI  [name] is a 79 y.o. female seen in followup for atrial arrhythmias/fibrillation associated with diastolic heart failure. She's been tried on flecainide, dofetilide, and most recently has been on amiodarone.    On Apixoban  W/out bleeding  Interval afib, seen in AFib clinic, w spont reversion Amio increased from 5d/w >> 7 days and increased dilt for augmented rate control  Patient denies symptoms of GI intolerance, sun sensitivity, neurological symptoms or cough attributable to amiodarone.    Dyspnea is chronic.  No edema.  No chest pain.  Now has developed a rash since drug uptitration  Had been followed for sleep apnea [facility] but has not been seen in some time.  On CPAP.   DATE TEST EF   11/12 Echo   60-65 %   11/15 Echo   60-65 % MR mild-mod  8/17 Echo  60-65% LAE (47/2.1/38)             Date Cr K Hgb TSH LFTs PFTs  8/17    2.444    3/18    0.175    4/19  0.93 4.0 13.3   17 106%  6/19 0.75 4.1      8/20 0.85 3.8 12.4 0.95    9/21 0.86 3.3 12.7 2.05 15         Thromboembolic risk factors ( age  -2, HTN-1, DM-1, Gender-1) for a CHADSVASc Score of >=5          Past Medical History:  Diagnosis Date  . Arthritis   . Back pain   . Chronic anticoagulation    due to aflutter  . Chronic kidney disease   . Diabetes mellitus   . Diastolic CHF, chronic ([place])    a.  echo 2006 - ef 55-65%; mild diast dysfxn;    b. Echo 08/2011: Mild LVH, EF 60%;  c. 04/2013 Echo: EF 65-69%, mild conc LVH;  08/2014 Echo: EF 60-65%, mild-mod MR.  . Gout   . Hyperlipidemia   . Hypertension    a.  Renal arterial Dopplers 12/2011: 1-59% right renal artery stenosis  . Morbid obesity ([place])   . Obstructive sleep apnea on CPAP   . Paroxysmal Afib/Flutter    a. dccv: 08/2011 -  on amiodarone/coumadin    Past Surgical History:  Procedure Laterality Date  . APPENDECTOMY    . ATRIAL FLUTTER ABLATION N/A 09/24/2011   Procedure: ATRIAL FLUTTER ABLATION;  Surgeon: [name], MD;  Location[facility] C[facility] LAB;  Service: Cardiovascular;  Laterality: N/A;  . CARDIOVERSION  10/22/2011   Procedure: CARDIOVERSION;  Surgeon: [name], MD;  Location: [place];  Service: Cardiovascular;  Laterality: N/A;  . CARDIOVERSION N/A 09/10/2011   Procedure: CARDIOVERSION;  Surgeon: [name], MD;  Location: [facility] CATH LAB;  Service: Cardiovascular;  Laterality: N/A;  . CHOLECYSTECTOMY    . TONSILLECTOMY  1982  . TOTAL ABDOMINAL HYSTERECTOMY      Current Outpatient Medications  Medication Sig Dispense Refill  . Accu-Chek Softclix Lancets lancets 1 each by Other route 3 (three) times daily. as directed 100 each 3  . allopurinol (ZYLOPRIM) 300 MG tablet Take 300 mg by mouth daily.     Marland Kitchen amiodarone (PACERONE) 200 MG tablet Take 1 tablet (200 mg total) by mouth daily.  90 tablet 3  . apixaban (ELIQUIS) 5 MG TABS tablet Take 1 tablet (5 mg total) by mouth 2 (two) times daily. 180 tablet 1  . blood glucose meter kit and supplies Dispense based on patient and insurance preference. Use up to four times daily as directed. (FOR ICD-10 E10.9, E11.9). 1 each 0  . cloNIDine (CATAPRES) 0.3 MG tablet Take 1 tablet (0.3 mg total) by mouth 2 (two) times daily. 60 tablet 3  . Continuous Blood Gluc Receiver (FREESTYLE LIBRE 14 DAY READER) DEVI Use as directed. 1 each 0  . diltiazem (CARDIZEM CD) 120 MG 24 hr capsule Take 1 capsule (120 mg total) by mouth in the morning and at bedtime. 60 capsule 3  . diltiazem (CARDIZEM) 30 MG tablet Take one tablet every 4 hours as needed for heart rate greater than 100 and blood pressure needs to be above 100 30 tablet 6  . Insulin Lispro Prot & Lispro (HUMALOG 75/25 MIX) (75-25) 100 UNIT/ML Kwikpen Inject 15-21 Units into the skin with breakfast, with lunch, and with  evening meal. Sliding scale insulin    . levothyroxine (SYNTHROID, LEVOTHROID) 75 MCG tablet Take 75 mcg daily before breakfast by mouth.     . loperamide (IMODIUM) 2 MG capsule Take 1 capsule (2 mg total) by mouth 4 (four) times daily as needed for diarrhea or loose stools. 12 capsule 0  . losartan (COZAAR) 100 MG tablet Take 1 tablet (100 mg total) by mouth daily. 90 tablet 3  . NOVOFINE PLUS 32G X 4 MM MISC 1 each by Subdermal route 2 (two) times daily. as directed 60 each 3  . potassium chloride (KLOR-CON) 10 MEQ tablet Take 2 tablets (20 mEq total) by mouth daily. 180 tablet 3  . RESTASIS MULTIDOSE 0.05 % ophthalmic emulsion Place 1 drop into both eyes 2 (two) times daily.     . rosuvastatin (CRESTOR) 10 MG tablet Take 10 mg by mouth every evening.    . Semaglutide,0.25 or 0.5MG/DOS, (OZEMPIC, 0.25 OR 0.5 MG/DOSE,) 2 MG/1.5ML SOPN Inject 0.75 mLs (1 mg total) into the skin once a week. 12 pen 3  . torsemide (DEMADEX) 20 MG tablet Take 2 tablets (40 mg total) by mouth daily. 180 tablet 3  . traMADol (ULTRAM) 50 MG tablet Take 50 mg by mouth in the morning, at noon, and at bedtime.     . triamcinolone cream (KENALOG) 0.1 % Apply 1 application topically 2 (two) times daily. 30 g 0   No current facility-administered medications for this visit.    Allergies  Allergen Reactions  . Metformin Diarrhea    Review of Systems negative except from HPI and PMH  Physical Exam BP 138/70   Pulse 60   Ht 5' (1.524 m)   Wt 256 lb (116.1 kg)   SpO2 91%   BMI 50.00 kg/m   BP 138/70   Pulse 60   Ht 5' (1.524 m)   Wt 256 lb (116.1 kg)   SpO2 91%   BMI 50.00 kg/m  Well developed and Morbidly obese in no acute distress HENT normal Neck supple with JVP-flat Clear Regular rate and rhythm, no  gallop No  murmur Abd-soft with active BS No Clubbing cyanosis  edema Skin-warm and dry A & Oriented  Grossly normal sensory and motor function  ECG sinus at 60 Intervals 23/08/51   Assessment and   Plan  Hypertension   Persistent atrial fibrillation   HFpEF  Chronic  Hypokalemia  Rash  Obstructive sleep apnea-treated  High Risk Medication Surveillance-=amiodarone  Morbidly obese   Recurrent atrial fibrillation.  Amiodarone dose has been increased.  May need reloading.  Surveillance laboratories within limits.  Not sure that she is a candidate for ablation given her BMI.  Potential contributors include hypokalemia; K was 3.3 on last evaluation.  We will recheck it today.  Also sleep apnea.  Has not been seen in follow[facility]up at [facility] in some time.  Will establish follow-up with Dr. Eugene Garnet.  Euvolemic continue current meds   Rash has developed on the higher doses of diltiazem.  We will switch her to verapamil.  No problem with constipation.  Diltiazem and amiodarone has not been a heart rate issue.  Suspect she will tolerate it fine.

## 2020-08-16 NOTE — Telephone Encounter (Signed)
Pt states she was in AFIB until last night.  Pt took her extra Diltiazem 30mg  that she is to take for HR above 100.  Pt was prescribed Verapamil 120mg  1 tablet by mouth bid at 08/15/2020 OV with Dr Caryl Comes.  Pt was advised yesterday to stop Diltiazem.  Pt asking if she may continue prn Diltiazem 30mg  along with her Verapamil.  Will forward request to Pharm D and Dr Caryl Comes for further advisement.  Pt verbalizes understanding and agrees with current plan.

## 2020-08-16 NOTE — Telephone Encounter (Signed)
Yes this should be fine as long as she is using diltiazem prn. Could consider using a beta blocker prn instead, but again I dont see an issue using the diltiazem prn

## 2020-08-17 ENCOUNTER — Ambulatory Visit: Payer: Medicare Other

## 2020-08-17 ENCOUNTER — Telehealth: Payer: Self-pay | Admitting: Physician Assistant

## 2020-08-17 ENCOUNTER — Other Ambulatory Visit: Payer: Self-pay | Admitting: Physician Assistant

## 2020-08-17 DIAGNOSIS — E876 Hypokalemia: Secondary | ICD-10-CM

## 2020-08-17 NOTE — Telephone Encounter (Signed)
Patient called cardiology after hour answering service to follow-up on Dr. Olin Pia message.  Based on the recent lab result, her potassium was low and the Dr. Caryl Comes recommended to take 4 tablets of the 10 mEq of potassium supplement and recheck potassium level next week.  I attempted to call the patient twice to give her the instruction, she did not answer the phone.

## 2020-08-17 NOTE — Telephone Encounter (Signed)
Was able to speak to [name] and reiterated Dr Olin Pia recommendation. Patient will need BMET lab at [facility] office next Friday. Will forward to our cardiology scheduler to arrange. BMET order placed.

## 2020-08-20 ENCOUNTER — Encounter: Payer: Self-pay | Admitting: Physical Therapy

## 2020-08-20 ENCOUNTER — Ambulatory Visit: Payer: Medicare Other | Attending: Orthopaedic Surgery | Admitting: Physical Therapy

## 2020-08-20 ENCOUNTER — Telehealth: Payer: Self-pay | Admitting: *Deleted

## 2020-08-20 ENCOUNTER — Other Ambulatory Visit: Payer: Self-pay

## 2020-08-20 DIAGNOSIS — G8929 Other chronic pain: Secondary | ICD-10-CM | POA: Insufficient documentation

## 2020-08-20 DIAGNOSIS — M6281 Muscle weakness (generalized): Secondary | ICD-10-CM | POA: Diagnosis not present

## 2020-08-20 DIAGNOSIS — M25562 Pain in left knee: Secondary | ICD-10-CM | POA: Insufficient documentation

## 2020-08-20 DIAGNOSIS — M25561 Pain in right knee: Secondary | ICD-10-CM | POA: Diagnosis not present

## 2020-08-20 DIAGNOSIS — R262 Difficulty in walking, not elsewhere classified: Secondary | ICD-10-CM | POA: Insufficient documentation

## 2020-08-20 NOTE — Therapy (Addendum)
[place], [place], [address] Phone: [phone]   Fax:  [phone]  Physical Therapy Treatment/Discharge  Patient Details  Name: [name] MRN: [id number] Date of Birth: 08/20/1941 Referring Provider (PT): Mcarthur Rossetti, MD   Encounter Date: 08/20/2020   PT End of Session - 08/20/20 1621    Visit Number 2    Number of Visits 9    Date for PT Re-Evaluation 09/22/20    Authorization Type [employer] Medicare - FOTO visit 6 and 10; PN visit 10; KX visit 15    PT Start Time 1622    PT Stop Time 1650    PT Time Calculation (min) 28 min    Activity Tolerance Patient tolerated treatment well    Behavior During Therapy [facility] for tasks assessed/performed           Past Medical History:  Diagnosis Date  . Arthritis   . Back pain   . Chronic anticoagulation    due to aflutter  . Chronic kidney disease   . Diabetes mellitus   . Diastolic CHF, chronic ([place])    a.  echo 2006 - ef 55-65%; mild diast dysfxn;    b. Echo 08/2011: Mild LVH, EF 60%;  c. 04/2013 Echo: EF 65-69%, mild conc LVH;  08/2014 Echo: EF 60-65%, mild-mod MR.  . Gout   . Hyperlipidemia   . Hypertension    a.  Renal arterial Dopplers 12/2011: 1-59% right renal artery stenosis  . Morbid obesity ([place])   . Obstructive sleep apnea on CPAP   . Paroxysmal Afib/Flutter    a. 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 APPENDECTOMY    . ATRIAL FLUTTER ABLATION N/A 09/24/2011   Procedure: ATRIAL FLUTTER ABLATION;  Surgeon: [name], MD;  Location: [facility] CATH LAB;  Service: Cardiovascular;  Laterality: N/A;  . CARDIOVERSION  10/22/2011   Procedure: CARDIOVERSION;  Surgeon: [name], MD;  Location: [place];  Service: Cardiovascular;  Laterality: N/A;  . CARDIOVERSION N/A 09/10/2011   Procedure: CARDIOVERSION;  Surgeon: [name], MD;  Location: [facility] CATH LAB;  Service:  Cardiovascular;  Laterality: N/A;  . CHOLECYSTECTOMY    . TONSILLECTOMY  1982  . TOTAL ABDOMINAL HYSTERECTOMY      There were no vitals filed for this visit.   Subjective Assessment - 08/20/20 1622    Subjective Very painful to do the exercises at home. Today I have a lower back pain that is unbearable- I feel like that sometimes when it is cold in the AM. Back is 11/10.    Patient Stated Goals Being able to wear different shoes, walking, household activities    Currently in Pain? Yes    Pain Score 5     Pain Orientation Right;Left    Pain Descriptors / Indicators --   like they give out                            [facility] Adult PT Treatment/Exercise - 08/20/20 0001      Knee/Hip Exercises: Stretches   Passive Hamstring Stretch Both;2 reps;30 seconds    Passive Hamstring Stretch Limitations seated edge of chair      Knee/Hip Exercises: Standing   Gait Training heel-toe, upright posture      Knee/Hip Exercises: Seated   Heel Slides Limitations foot on towel- 2x10 each    [facility]  08/20/20 1654    Clinical Impression Statement Kept treatment a little shorter today due to high pain levels to start and pt reporting feeling that her heart rate increases significantly with pain. Added a couple of exercises to HEP including stretch in HS. We discussed that she should feel fatigue after exercises but not an increase in concordant pain. Pt reported back pain decreased to 8/10 from 11/10 upon arrival and was able to ambulate out of the  clinic comfortably with her RW.    PT Treatment/Interventions ADLs/Self Care Home Management;Cryotherapy;Electrical Stimulation;Moist Heat;Neuromuscular re-education;Balance training;Therapeutic exercise;Therapeutic activities;Functional mobility training;Gait training;Stair training;Patient/family education;Passive range of motion;Taping;Vasopneumatic Device;Dry needling;Energy conservation    PT Home Exercise Plan [id number] - seated alternating marches, seated quad sets, seated LAQ with SLR, seated HSS, heel slide, seated ADDuction    Consulted and Agree with Plan of Care Patient           Patient will benefit from skilled therapeutic intervention in order to improve the following deficits and impairments:  Abnormal gait, Decreased activity tolerance, Decreased balance, Decreased mobility, Decreased strength, Decreased endurance, Decreased range of motion, Difficulty walking  Visit Diagnosis: Chronic pain of right knee  Chronic pain of left knee  Difficulty in walking, not elsewhere classified  Muscle weakness (generalized)     Problem List Patient Active Problem List   Diagnosis Date Noted  . Persistent atrial fibrillation ([place]) 08/12/2020  . Type 2 diabetes mellitus with hyperglycemia, with long-term current use of insulin ([place]) 06/21/2020  . Type 2 diabetes mellitus with diabetic polyneuropathy, with long-term current use of insulin ([place]) 06/21/2020  . Mixed hyperlipidemia 06/21/2020  . Uncontrolled hypertension 06/11/2020  . Aortic atherosclerosis ([place]) 03/28/2020  . Diabetic peripheral neuropathy ([place]) 12/26/2019  . Obesity 12/19/2015  . Hypersomnia 12/19/2015  . Acute diastolic congestive heart failure, NYHA class 3 ([place]) 10/03/2013  . Prolonged QT interval 10/03/2013  . Acute diastolic heart failure ([place]) 10/03/2013  . Long term (current) use of anticoagulants 06/09/2012  . BENIGN NEOPLASM OF ADRENAL GLAND 11/25/2010  . Chronic diastolic heart failure ([place]) 02/20/2010    . DM 05/16/2009  . GOUT 05/16/2009  . OBESITY, MORBID 05/16/2009  . Hypertension associated with diabetes ([place]) 05/16/2009  . Paroxysmal atrial fibrillation ([place]) 05/16/2009  . HYPERLIPIDEMIA 11/30/2008  . Obstructive sleep apnea on CPAP 11/30/2008    Danel Requena C. Oaklee Sunga PT, DPT 08/20/20 4:57 PM   [facility] [address] [place], [place], [address] Phone: [phone]   Fax:  [phone]  Name: [name] MRN: [id number] Date of Birth: July 22, 1941  PHYSICAL THERAPY DISCHARGE SUMMARY  Visits from Start of Care: 2  Current functional level related to goals / functional outcomes: See above   Remaining deficits: See above   Education / Equipment: Anatomy of condition, POC, HEP, exercise form/rationale  Plan: Patient agrees to discharge.  Patient goals were not met. Patient is being discharged due to a change in medical status.  ?????    Pt has had multiple falls since her last appointment and is now receiving HHPT.  Xylina Rhoads C. Sharri Loya PT, DPT 09/04/20 6:33 PM  08/20/20 1654    Clinical Impression Statement Kept treatment a little shorter today due to high pain levels to start and pt reporting feeling that her heart rate increases significantly with pain. Added a couple of exercises to HEP including stretch in HS. We discussed that she should feel fatigue after exercises but not an increase in concordant pain. Pt reported back pain decreased to 8/10 from 11/10 upon arrival and was able to ambulate out of the  clinic comfortably with her RW.    PT Treatment/Interventions ADLs/Self Care Home Management;Cryotherapy;Electrical Stimulation;Moist Heat;Neuromuscular re-education;Balance training;Therapeutic exercise;Therapeutic activities;Functional mobility training;Gait training;Stair training;Patient/family education;Passive range of motion;Taping;Vasopneumatic Device;Dry needling;Energy conservation    PT Home Exercise Plan [id number] - seated alternating marches, seated quad sets, seated LAQ with SLR, seated HSS, heel slide, seated ADDuction    Consulted and Agree with Plan of Care Patient           Patient will benefit from skilled therapeutic intervention in order to improve the following deficits and impairments:  Abnormal gait, Decreased activity tolerance, Decreased balance, Decreased mobility, Decreased strength, Decreased endurance, Decreased range of motion, Difficulty walking  Visit Diagnosis: Chronic pain of right knee  Chronic pain of left knee  Difficulty in walking, not elsewhere classified  Muscle weakness (generalized)     Problem List Patient Active Problem List   Diagnosis Date Noted  . Persistent atrial fibrillation ([place]) 08/12/2020  . Type 2 diabetes mellitus with hyperglycemia, with long-term current use of insulin ([place]) 06/21/2020  . Type 2 diabetes mellitus with diabetic polyneuropathy, with long-term current use of insulin ([place]) 06/21/2020  . Mixed hyperlipidemia 06/21/2020  . Uncontrolled hypertension 06/11/2020  . Aortic atherosclerosis ([place]) 03/28/2020  . Diabetic peripheral neuropathy ([place]) 12/26/2019  . Obesity 12/19/2015  . Hypersomnia 12/19/2015  . Acute diastolic congestive heart failure, NYHA class 3 ([place]) 10/03/2013  . Prolonged QT interval 10/03/2013  . Acute diastolic heart failure ([place]) 10/03/2013  . Long term (current) use of anticoagulants 06/09/2012  . BENIGN NEOPLASM OF ADRENAL GLAND 11/25/2010  . Chronic diastolic heart failure ([place]) 02/20/2010    . DM 05/16/2009  . GOUT 05/16/2009  . OBESITY, MORBID 05/16/2009  . Hypertension associated with diabetes ([place]) 05/16/2009  . Paroxysmal atrial fibrillation ([place]) 05/16/2009  . HYPERLIPIDEMIA 11/30/2008  . Obstructive sleep apnea on CPAP 11/30/2008    Danel Requena C. Oaklee Sunga PT, DPT 08/20/20 4:57 PM   [facility] [address] [place], [place], [address] Phone: [phone]   Fax:  [phone]  Name: [name] MRN: [id number] Date of Birth: July 22, 1941  PHYSICAL THERAPY DISCHARGE SUMMARY  Visits from Start of Care: 2  Current functional level related to goals / functional outcomes: See above   Remaining deficits: See above   Education / Equipment: Anatomy of condition, POC, HEP, exercise form/rationale  Plan: Patient agrees to discharge.  Patient goals were not met. Patient is being discharged due to a change in medical status.  ?????    Pt has had multiple falls since her last appointment and is now receiving HHPT.  Xylina Rhoads C. Sharri Loya PT, DPT 09/04/20 6:33 PM

## 2020-08-20 NOTE — Telephone Encounter (Signed)
On 08/16/2020, faxed signed Rx for therapeutic supplies. Confirmation page 12:14 pm.

## 2020-08-21 ENCOUNTER — Encounter (HOSPITAL_COMMUNITY): Payer: Self-pay | Admitting: *Deleted

## 2020-08-21 ENCOUNTER — Other Ambulatory Visit: Payer: Self-pay

## 2020-08-21 ENCOUNTER — Emergency Department (HOSPITAL_COMMUNITY): Payer: Medicare Other

## 2020-08-21 ENCOUNTER — Emergency Department (HOSPITAL_COMMUNITY)
Admission: EM | Admit: 2020-08-21 | Discharge: 2020-08-21 | Disposition: A | Discharge status: Home / Self Care | Payer: Medicare Other | Attending: Emergency Medicine | Admitting: Emergency Medicine

## 2020-08-21 ENCOUNTER — Other Ambulatory Visit: Payer: Medicare Other

## 2020-08-21 DIAGNOSIS — E1142 Type 2 diabetes mellitus with diabetic polyneuropathy: Secondary | ICD-10-CM | POA: Insufficient documentation

## 2020-08-21 DIAGNOSIS — W19XXXA Unspecified fall, initial encounter: Secondary | ICD-10-CM | POA: Diagnosis not present

## 2020-08-21 DIAGNOSIS — S8002XA Contusion of left knee, initial encounter: Secondary | ICD-10-CM | POA: Insufficient documentation

## 2020-08-21 DIAGNOSIS — I130 Hypertensive heart and chronic kidney disease with heart failure and stage 1 through stage 4 chronic kidney disease, or unspecified chronic kidney disease: Secondary | ICD-10-CM | POA: Diagnosis not present

## 2020-08-21 DIAGNOSIS — I5031 Acute diastolic (congestive) heart failure: Secondary | ICD-10-CM | POA: Diagnosis not present

## 2020-08-21 DIAGNOSIS — E1122 Type 2 diabetes mellitus with diabetic chronic kidney disease: Secondary | ICD-10-CM | POA: Diagnosis not present

## 2020-08-21 DIAGNOSIS — Z7901 Long term (current) use of anticoagulants: Secondary | ICD-10-CM | POA: Diagnosis not present

## 2020-08-21 DIAGNOSIS — M25511 Pain in right shoulder: Secondary | ICD-10-CM | POA: Diagnosis not present

## 2020-08-21 DIAGNOSIS — Z794 Long term (current) use of insulin: Secondary | ICD-10-CM | POA: Insufficient documentation

## 2020-08-21 DIAGNOSIS — S60221A Contusion of right hand, initial encounter: Secondary | ICD-10-CM | POA: Diagnosis not present

## 2020-08-21 DIAGNOSIS — W109XXA Fall (on) (from) unspecified stairs and steps, initial encounter: Secondary | ICD-10-CM | POA: Insufficient documentation

## 2020-08-21 DIAGNOSIS — N189 Chronic kidney disease, unspecified: Secondary | ICD-10-CM | POA: Diagnosis not present

## 2020-08-21 DIAGNOSIS — Z743 Need for continuous supervision: Secondary | ICD-10-CM | POA: Diagnosis not present

## 2020-08-21 DIAGNOSIS — E1165 Type 2 diabetes mellitus with hyperglycemia: Secondary | ICD-10-CM | POA: Diagnosis not present

## 2020-08-21 DIAGNOSIS — M1712 Unilateral primary osteoarthritis, left knee: Secondary | ICD-10-CM | POA: Diagnosis not present

## 2020-08-21 DIAGNOSIS — S199XXA Unspecified injury of neck, initial encounter: Secondary | ICD-10-CM | POA: Diagnosis not present

## 2020-08-21 DIAGNOSIS — S8001XA Contusion of right knee, initial encounter: Secondary | ICD-10-CM

## 2020-08-21 DIAGNOSIS — M852 Hyperostosis of skull: Secondary | ICD-10-CM | POA: Diagnosis not present

## 2020-08-21 DIAGNOSIS — S0990XA Unspecified injury of head, initial encounter: Secondary | ICD-10-CM | POA: Diagnosis not present

## 2020-08-21 DIAGNOSIS — Z79899 Other long term (current) drug therapy: Secondary | ICD-10-CM | POA: Diagnosis not present

## 2020-08-21 DIAGNOSIS — S46911A Strain of unspecified muscle, fascia and tendon at shoulder and upper arm level, right arm, initial encounter: Secondary | ICD-10-CM | POA: Insufficient documentation

## 2020-08-21 DIAGNOSIS — R52 Pain, unspecified: Secondary | ICD-10-CM | POA: Diagnosis not present

## 2020-08-21 DIAGNOSIS — I10 Essential (primary) hypertension: Secondary | ICD-10-CM | POA: Diagnosis not present

## 2020-08-21 DIAGNOSIS — M79641 Pain in right hand: Secondary | ICD-10-CM | POA: Diagnosis not present

## 2020-08-21 DIAGNOSIS — M25561 Pain in right knee: Secondary | ICD-10-CM | POA: Diagnosis not present

## 2020-08-21 DIAGNOSIS — R6889 Other general symptoms and signs: Secondary | ICD-10-CM | POA: Diagnosis not present

## 2020-08-21 DIAGNOSIS — M542 Cervicalgia: Secondary | ICD-10-CM | POA: Diagnosis not present

## 2020-08-21 LAB — CBC WITH DIFFERENTIAL/PLATELET
Abs Immature Granulocytes: 0.04 10*3/uL (ref 0.00–0.07)
Basophils Absolute: 0 10*3/uL (ref 0.0–0.1)
Basophils Relative: 0 %
Eosinophils Absolute: 0.2 10*3/uL (ref 0.0–0.5)
Eosinophils Relative: 2 %
HCT: 39.9 % (ref 36.0–46.0)
Hemoglobin: 12.1 g/dL (ref 12.0–15.0)
Immature Granulocytes: 0 %
Lymphocytes Relative: 19 %
Lymphs Abs: 2.2 10*3/uL (ref 0.7–4.0)
MCH: 26.2 pg (ref 26.0–34.0)
MCHC: 30.3 g/dL (ref 30.0–36.0)
MCV: 86.4 fL (ref 80.0–100.0)
Monocytes Absolute: 1.2 10*3/uL — ABNORMAL HIGH (ref 0.1–1.0)
Monocytes Relative: 11 %
Neutro Abs: 7.9 10*3/uL — ABNORMAL HIGH (ref 1.7–7.7)
Neutrophils Relative %: 68 %
Platelets: 306 10*3/uL (ref 150–400)
RBC: 4.62 MIL/uL (ref 3.87–5.11)
RDW: 16.4 % — ABNORMAL HIGH (ref 11.5–15.5)
WBC: 11.7 10*3/uL — ABNORMAL HIGH (ref 4.0–10.5)
nRBC: 0 % (ref 0.0–0.2)

## 2020-08-21 LAB — BASIC METABOLIC PANEL
Anion gap: 9 (ref 5–15)
BUN: 13 mg/dL (ref 8–23)
CO2: 31 mmol/L (ref 22–32)
Calcium: 9.2 mg/dL (ref 8.9–10.3)
Chloride: 101 mmol/L (ref 98–111)
Creatinine, Ser: 0.69 mg/dL (ref 0.44–1.00)
GFR, Estimated: >60 mL/min (ref >60)
Glucose, Bld: 173 mg/dL — ABNORMAL HIGH (ref 70–99)
Potassium: 3.5 mmol/L (ref 3.5–5.1)
Sodium: 141 mmol/L (ref 135–145)

## 2020-08-21 MED ORDER — OXYCODONE-ACETAMINOPHEN 5-325 MG PO TABS
1.0000 | ORAL_TABLET | Freq: Once | ORAL | Status: AC
  Administered 2020-08-21: 1 via ORAL
  Filled 2020-08-21: qty 1

## 2020-08-21 MED ORDER — OXYCODONE-ACETAMINOPHEN 5-325 MG PO TABS: 2.0000 | ORAL_TABLET | ORAL | 0 refills | Status: AC | PRN

## 2020-08-21 NOTE — ED Notes (Signed)
Got patient undress on the monitor got vitals did ekg shown to er provider patient is resting with call bell in reach

## 2020-08-21 NOTE — ED Triage Notes (Signed)
Pt BIB [facility] d/t mechanical fall on thinners. She denies hitting her head but c/o pain in her Rt shoulder (possible deformity), hip & leg.

## 2020-08-21 NOTE — ED Provider Notes (Signed)
Signout note  79 year old lady presenting to ER after fall, concern for bilateral knee pain and right shoulder pain, unsure of head trauma.    3:00 PM Received signout from [place], if imaging negative, likely dc home  3:40 PM rechecked, awaiting readds, will provide some pain medicine   Lucrezia Starch, MD 08/21/20 1559

## 2020-08-21 NOTE — Discharge Instructions (Signed)
Please schedule a follow-up appointment with both your primary doctor as well as your orthopedic doctor.  Recommend Tylenol and Motrin for pain control.  For breakthrough pain, he can take the prescribed Percocet as needed.  Please note this can make you drowsy should not be taken with driving or operating heavy machinery.

## 2020-08-21 NOTE — ED Provider Notes (Signed)
[facility] EMERGENCY DEPARTMENT Provider Note   CSN: [id number] Arrival date & time: 08/21/20  1328     History Chief Complaint  Patient presents with  . Fall    [name] is a 79 y.o. female.  She is here for evaluation of injuries from a fall.  She States she was using her walker trying to go down 3 steps when she got tangled up and fell to the ground.  She struck the right side of her head and has pain in her right shoulder right hand and bilateral knees.  She was unable to get herself back up.  There was no loss of consciousness.  She is on blood thinners for A. fib.  The history is provided by the patient.  Fall This is a recurrent problem. The current episode started 1 to 2 hours ago. The problem has not changed since onset.Associated symptoms include headaches. Pertinent negatives include no chest pain, no abdominal pain and no shortness of breath. The symptoms are aggravated by bending and twisting. Nothing relieves the symptoms. She has tried nothing for the symptoms. The treatment provided no relief.       Past Medical History:  Diagnosis Date  . Arthritis   . Back pain   . Chronic anticoagulation    due to aflutter  . Chronic kidney disease   . Diabetes mellitus   . Diastolic CHF, chronic ([place])    a.  echo 2006 - ef 55-65%; mild diast dysfxn;    b. Echo 08/2011: Mild LVH, EF 60%;  c. 04/2013 Echo: EF 65-69%, mild conc LVH;  08/2014 Echo: EF 60-65%, mild-mod MR.  . Gout   . Hyperlipidemia   . Hypertension    a.  Renal arterial Dopplers 12/2011: 1-59% right renal artery stenosis  . Morbid obesity ([place])   . Obstructive sleep apnea on CPAP   . Paroxysmal Afib/Flutter    a. 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    Patient Active Problem List   Diagnosis Date Noted  . Persistent atrial fibrillation ([place]) 08/12/2020  . Type 2 diabetes mellitus with hyperglycemia, with long-term current use of insulin ([place]) 06/21/2020  . Type 2 diabetes  mellitus with diabetic polyneuropathy, with long-term current use of insulin ([place]) 06/21/2020  . Mixed hyperlipidemia 06/21/2020  . Uncontrolled hypertension 06/11/2020  . Aortic atherosclerosis ([place]) 03/28/2020  . Diabetic peripheral neuropathy ([place]) 12/26/2019  . Obesity 12/19/2015  . Hypersomnia 12/19/2015  . Acute diastolic congestive heart failure, NYHA class 3 ([place]) 10/03/2013  . Prolonged QT interval 10/03/2013  . Acute diastolic heart failure ([place]) 10/03/2013  . Long term (current) use of anticoagulants 06/09/2012  . BENIGN NEOPLASM OF ADRENAL GLAND 11/25/2010  . Chronic diastolic heart failure ([place]) 02/20/2010  . DM 05/16/2009  . GOUT 05/16/2009  . OBESITY, MORBID 05/16/2009  . Hypertension associated with diabetes ([place]) 05/16/2009  . Paroxysmal atrial fibrillation ([place]) 05/16/2009  . HYPERLIPIDEMIA 11/30/2008  . Obstructive sleep apnea on CPAP 11/30/2008    Past Surgical History:  Procedure Laterality Date  . APPENDECTOMY    . ATRIAL FLUTTER ABLATION N/A 09/24/2011   Procedure: ATRIAL FLUTTER ABLATION;  Surgeon: [name], MD;  Location: [facility] CATH LAB;  Service: Cardiovascular;  Laterality: N/A;  . CARDIOVERSION  10/22/2011   Procedure: CARDIOVERSION;  Surgeon: [name], MD;  Location: [facility];  Service: Cardiovascular;  Laterality: N/A;  . CARDIOVERSION N/A 09/10/2011   Procedure: CARDIOVERSION;  Surgeon: [name], MD;  Location: [facility] CATH LAB;  Service: Cardiovascular;  Laterality: N/A;  . CHOLECYSTECTOMY    . TONSILLECTOMY  1982  . TOTAL ABDOMINAL HYSTERECTOMY       OB History   No obstetric history on file.     Family History  Problem Relation Age of Onset  . Heart disease Father   . Hypertension Father   . Breast cancer Sister   . Cancer Sister        breast    Social History   Tobacco Use  . Smoking status: Never Smoker  . Smokeless tobacco: Never Used  Vaping Use  . Vaping Use: Never used  Substance Use Topics  . Alcohol use: No  .  Drug use: No    Home Medications Prior to Admission medications   Medication Sig Start Date End Date Taking? Authorizing Provider  Accu-Chek Softclix Lancets lancets 1 each by Other route 3 (three) times daily. as directed 03/30/20   Maximiano Coss, NP  allopurinol (ZYLOPRIM) 300 MG tablet Take 300 mg by mouth daily.  07/07/13   [provider]  amiodarone (PACERONE) 200 MG tablet Take 1 tablet (200 mg total) by mouth daily. 07/18/20   Sherran Needs, NP  apixaban (ELIQUIS) 5 MG TABS tablet Take 1 tablet (5 mg total) by mouth 2 (two) times daily. 06/14/20   [name], MD  blood glucose meter kit and supplies Dispense based on patient and insurance preference. Use up to four times daily as directed. (FOR ICD-10 E10.9, E11.9). 05/03/20   Horald Pollen, MD  cloNIDine (CATAPRES) 0.3 MG tablet Take 1 tablet (0.3 mg total) by mouth 2 (two) times daily. 06/08/20   Posey Boyer, MD  Continuous Blood Gluc Receiver (FREESTYLE LIBRE 14 DAY READER) DEVI Use as directed. 04/18/20   Horald Pollen, MD  Insulin Lispro Prot & Lispro (HUMALOG 75/25 MIX) (75-25) 100 UNIT/ML Kwikpen Inject 15-21 Units into the skin with breakfast, with lunch, and with evening meal. Sliding scale insulin 08/12/16   [provider]  levothyroxine (SYNTHROID, LEVOTHROID) 75 MCG tablet Take 75 mcg daily before breakfast by mouth.  12/29/16   [provider]  loperamide (IMODIUM) 2 MG capsule Take 1 capsule (2 mg total) by mouth 4 (four) times daily as needed for diarrhea or loose stools. 03/20/20   Garald Balding, PA-C  losartan (COZAAR) 100 MG tablet Take 1 tablet (100 mg total) by mouth daily. 05/31/20   Horald Pollen, MD  NOVOFINE PLUS 32G X 4 MM MISC 1 each by Subdermal route 2 (two) times daily. as directed 03/30/20   Maximiano Coss, NP  potassium chloride (KLOR-CON) 10 MEQ tablet Take 2 tablets (20 mEq total) by mouth daily. 10/18/19   [name], MD  RESTASIS MULTIDOSE 0.05  % ophthalmic emulsion Place 1 drop into both eyes 2 (two) times daily.  03/04/20   [provider]  rosuvastatin (CRESTOR) 10 MG tablet Take 10 mg by mouth every evening.    [provider]  Semaglutide,0.25 or 0.5MG /DOS, (OZEMPIC, 0.25 OR 0.5 MG/DOSE,) 2 MG/1.5ML SOPN Inject 0.75 mLs (1 mg total) into the skin once a week. 04/18/20   Horald Pollen, MD  torsemide (DEMADEX) 20 MG tablet Take 2 tablets (40 mg total) by mouth daily. 09/29/19   [name], MD  traMADol (ULTRAM) 50 MG tablet Take 50 mg by mouth in the morning, at noon, and at bedtime.     [provider]  triamcinolone cream (KENALOG) 0.1 % Apply 1  application topically 2 (two) times daily. 07/26/20   Horald Pollen, MD  verapamil (CALAN) 120 MG tablet Take 1 tablet (120 mg total) by mouth 2 (two) times daily. 08/15/20   [name], MD    Allergies    Metformin  Review of Systems   Review of Systems  Constitutional: Negative for fever.  HENT: Negative for sore throat.   Eyes: Negative for visual disturbance.  Respiratory: Negative for shortness of breath.   Cardiovascular: Negative for chest pain.  Gastrointestinal: Negative for abdominal pain.  Genitourinary: Negative for dysuria.  Musculoskeletal: Negative for neck pain.  Skin: Positive for wound. Negative for rash.  Neurological: Positive for headaches.    Physical Exam Updated Vital Signs BP 124/61 (BP Location: Left Arm)   Pulse (!) 56   Temp 97.6 F (36.4 C) (Oral)   Resp 20   SpO2 98%   Physical Exam Vitals and nursing note reviewed.  Constitutional:      General: She is not in acute distress.    Appearance: Normal appearance. She is well-developed.  HENT:     Head: Normocephalic and atraumatic.  Eyes:     Conjunctiva/sclera: Conjunctivae normal.  Cardiovascular:     Rate and Rhythm: Normal rate and regular rhythm.     Heart sounds: No murmur heard.   Pulmonary:     Effort: Pulmonary effort is normal.  No respiratory distress.     Breath sounds: Normal breath sounds.  Abdominal:     Palpations: Abdomen is soft.     Tenderness: There is no abdominal tenderness.  Musculoskeletal:        General: Tenderness present.     Cervical back: Neck supple.     Comments: She has tenderness of her right shoulder and pain when she tries to abduct.  Normal landmarks.  Right hand has some bruising over her second MCP and some tenderness.  Full range of motion of digits.  Nontender elbow and wrist.  Left upper extremity full range of motion without any pain or limitations.  Bilateral lower extremity she has tenderness anterior knees and some superficial abrasions.  Nontender hip and ankle.  No neck or back tenderness.  Skin:    General: Skin is warm and dry.     Capillary Refill: Capillary refill takes less than 2 seconds.  Neurological:     General: No focal deficit present.     Mental Status: She is alert.     Sensory: No sensory deficit.     Motor: No weakness.     ED Results / Procedures / Treatments   Labs (all labs ordered are listed, but only abnormal results are displayed) Labs Reviewed  BASIC METABOLIC PANEL - Abnormal; Notable for the following components:      Result Value   Glucose, Bld 173 (*)    All other components within normal limits  CBC WITH DIFFERENTIAL/PLATELET - Abnormal; Notable for the following components:   WBC 11.7 (*)    RDW 16.4 (*)    Neutro Abs 7.9 (*)    Monocytes Absolute 1.2 (*)    All other components within normal limits  URINALYSIS, ROUTINE W REFLEX MICROSCOPIC    EKG EKG Interpretation  Date/Time:  Tuesday August 21 2020 13:41:29 EDT Ventricular Rate:  54 PR Interval:    QRS Duration: 122 QT Interval:  501 QTC Calculation: 475 R Axis:   35 Text Interpretation: Sinus rhythm Prolonged PR interval Nonspecific intraventricular conduction delay No significant change since prior 10/21  Confirmed by Aletta Edouard [phone]) on 08/21/2020 1:45:31  PM   Radiology DG Shoulder Right  Result Date: 08/21/2020 CLINICAL DATA:  Right shoulder pain after fall. EXAM: RIGHT SHOULDER - 2+ VIEW COMPARISON:  None. FINDINGS: There is no evidence of fracture or dislocation. There is no evidence of arthropathy or other focal bone abnormality. Soft tissues are unremarkable. IMPRESSION: Negative. Electronically Signed   By: Marijo Conception M.D.   On: 08/21/2020 15:26   CT Head Wo Contrast  Result Date: 08/21/2020 CLINICAL DATA:  Head trauma.  Fall.  Neck pain. EXAM: CT HEAD WITHOUT CONTRAST CT CERVICAL SPINE WITHOUT CONTRAST TECHNIQUE: Multidetector CT imaging of the head and cervical spine was performed following the standard protocol without intravenous contrast. Multiplanar CT image reconstructions of the cervical spine were also generated. COMPARISON:  CT head January 13, 2018 FINDINGS: CT HEAD FINDINGS Brain: No evidence of acute infarction, hemorrhage, hydrocephalus, extra-axial collection or mass lesion/mass effect. Similar patchy white matter hypoattenuation, most likely related to chronic microvascular ischemic disease. Similar small hypodensity in the left caudate, compatible with prior lacunar infarct. Vascular: Calcific atherosclerosis. Skull: No acute fracture.  Hyperostosis frontalis. Sinuses/Orbits: Visualized sinuses are clear.  Unremarkable orbits. Other: No mastoid effusions. CT CERVICAL SPINE FINDINGS Alignment: Mild reversal of the normal cervical lordosis, likely positional. Otherwise, no substantial subluxation. Skull base and vertebrae: No acute fracture. No primary bone lesion or focal pathologic process. Soft tissues and spinal canal: No prevertebral fluid or swelling. No visible canal hematoma. Disc levels: Mild multilevel degenerative disc disease. No evidence of significant bony canal stenosis. Upper chest: No consolidation or visible pneumothorax. Other: None. IMPRESSION: 1. No evidence of acute intracranial abnormality. 2. No evidence of  acute fracture or traumatic malalignment in the cervical spine. Electronically Signed   By: Margaretha Sheffield MD   On: 08/21/2020 15:27   CT Cervical Spine Wo Contrast  Result Date: 08/21/2020 CLINICAL DATA:  Head trauma.  Fall.  Neck pain. EXAM: CT HEAD WITHOUT CONTRAST CT CERVICAL SPINE WITHOUT CONTRAST TECHNIQUE: Multidetector CT imaging of the head and cervical spine was performed following the standard protocol without intravenous contrast. Multiplanar CT image reconstructions of the cervical spine were also generated. COMPARISON:  CT head January 13, 2018 FINDINGS: CT HEAD FINDINGS Brain: No evidence of acute infarction, hemorrhage, hydrocephalus, extra-axial collection or mass lesion/mass effect. Similar patchy white matter hypoattenuation, most likely related to chronic microvascular ischemic disease. Similar small hypodensity in the left caudate, compatible with prior lacunar infarct. Vascular: Calcific atherosclerosis. Skull: No acute fracture.  Hyperostosis frontalis. Sinuses/Orbits: Visualized sinuses are clear.  Unremarkable orbits. Other: No mastoid effusions. CT CERVICAL SPINE FINDINGS Alignment: Mild reversal of the normal cervical lordosis, likely positional. Otherwise, no substantial subluxation. Skull base and vertebrae: No acute fracture. No primary bone lesion or focal pathologic process. Soft tissues and spinal canal: No prevertebral fluid or swelling. No visible canal hematoma. Disc levels: Mild multilevel degenerative disc disease. No evidence of significant bony canal stenosis. Upper chest: No consolidation or visible pneumothorax. Other: None. IMPRESSION: 1. No evidence of acute intracranial abnormality. 2. No evidence of acute fracture or traumatic malalignment in the cervical spine. Electronically Signed   By: Margaretha Sheffield MD   On: 08/21/2020 15:27   DG Knee Complete 4 Views Left  Result Date: 08/21/2020 CLINICAL DATA:  Left knee pain after fall today. EXAM: LEFT KNEE -  COMPLETE 4+ VIEW COMPARISON:  July 04, 2020. FINDINGS: No evidence of fracture, dislocation, or joint effusion. Moderate narrowing and  osteophyte formation is seen involving the medial joint space. Soft tissues are unremarkable. 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 No acute abnormality seen in the left knee. Electronically Signed   By: Marijo Conception M.D.   On: 08/21/2020 15:29   DG Knee Complete 4 Views Right  Result Date: 08/21/2020 CLINICAL DATA:  Right knee pain after fall today. EXAM: RIGHT KNEE - COMPLETE 4+ VIEW COMPARISON:  None. FINDINGS: No evidence of fracture, dislocation, or joint effusion. Mild narrowing of medial joint space is noted. Soft tissues are unremarkable. IMPRESSION: Mild degenerative joint disease. No acute abnormality seen in the right knee. Electronically Signed   By: Marijo Conception M.D.   On: 08/21/2020 15:30   DG Hand Complete Right  Result Date: 08/21/2020 CLINICAL DATA:  Right hand pain after fall. EXAM: RIGHT HAND - COMPLETE 3+ VIEW COMPARISON:  None. FINDINGS: There is no evidence of fracture or dislocation. Moderate degenerative changes seen involving the first carpometacarpal joint. Soft tissues are unremarkable. 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 No acute abnormality seen in the right hand. Electronically Signed   By: Marijo Conception M.D.   On: 08/21/2020 15:28    Procedures Procedures (including critical care time)  Medications Ordered in ED Medications - No data to display  ED Course  I have reviewed the triage vital signs and the nursing notes.  Pertinent labs & imaging results that were available during my care of the patient were reviewed by me and considered in my medical decision making (see chart for details).  Clinical Course as of Aug 21 1736  Tue Aug 21, 2020  1512 Patient signed out to oncoming provider to follow-up on results of imaging and lab work and if no acute findings  see if the patient is safe for discharge.   [MB]    Clinical Course User Index [MB] Hayden Rasmussen, MD   MDM Rules/Calculators/A&P                         This patient complains of pain and injuries from a fall; this involves an extensive number of treatment Options and is a complaint that carries with it a high risk of complications and Morbidity. The differential includes skull fracture, bleed, cervical fracture, shoulder fracture, hand fracture, knee fracture, contusion, dislocation  I ordered, reviewed and interpreted labs, which included CBC with a mildly elevated white count, stable hemoglobin, chemistries fairly normal other than elevated glucose I ordered imaging studies which included CT head and C-spine, right shoulder right hand and bilateral knees.  These are pending at time of signout to oncoming provider Previous records obtained and reviewed in epic, no recent admissions  After the interventions stated above, I reevaluated the patient and found patient still to be comfortable when not being moved.  Her care was signed out to oncoming provider to follow-up on imaging and lab work.  Anticipate if no fractures patient will need some pain control on trial mobilization to see if she can return to home.   Final Clinical Impression(s) / ED Diagnoses Final diagnoses:  Fall, initial encounter  Injury of head, initial encounter  Strain of right shoulder, initial encounter  Contusion of right hand, initial encounter  Contusion of right knee, initial encounter  Contusion of left knee, initial encounter    Rx / DC Orders ED Discharge Orders    None       Hayden Rasmussen, MD  08/21/20 1741  

## 2020-08-21 NOTE — ED Notes (Signed)
Pt is back from imaging.  

## 2020-08-21 NOTE — ED Notes (Addendum)
Pt given cup of ice water per EDP, Dykstra.

## 2020-08-22 ENCOUNTER — Telehealth: Payer: Self-pay | Admitting: Internal Medicine

## 2020-08-22 ENCOUNTER — Telehealth: Payer: Self-pay | Admitting: *Deleted

## 2020-08-22 ENCOUNTER — Telehealth: Payer: Self-pay | Admitting: Emergency Medicine

## 2020-08-22 NOTE — Telephone Encounter (Signed)
°   [name] DOB: [birth year] MRN: [id number]   RIDER WAIVER AND RELEASE OF LIABILITY  For purposes of improving physical access to our facilities, [facility] is pleased to partner with third parties to provide [facility] patients or other authorized individuals the option of convenient, on-demand ground transportation services (the [employer]) through use of the technology service that enables users to request on-demand ground transportation from independent third-party providers.  By opting to use and accept these [employer], I, the undersigned, hereby agree on behalf of myself, and on behalf of any minor child using the [employer] for whom I am the parent or legal guardian, as follows:  1. Government social research officer provided to me are provided by independent third-party transportation providers who are not [employer] or employees and who are unaffiliated with [employer]. 2. [facility] is neither a transportation carrier nor a common or public carrier. 3. [facility] has no control over the quality or safety of the transportation that occurs as a result of the [employer]. 4. [facility] cannot guarantee that any third-party transportation provider will complete any arranged transportation service. 5. [facility] makes no representation, warranty, or guarantee regarding the reliability, timeliness, quality, safety, suitability, or availability of any of the Transport Services or that they will be error free. 6. I fully understand that traveling by vehicle involves risks and dangers of serious bodily injury, including permanent disability, paralysis, and death. I agree, on behalf of myself and on behalf of any minor child using the Transport Services for whom I am the parent or legal guardian, that the entire risk arising out of my use of the [employer] remains solely with me, to the maximum extent permitted under applicable law. 7. The [employer] are provided as is and as available. [facility] disclaims all representations and warranties, express, implied or statutory, not expressly set out in these terms, including the implied warranties of merchantability and fitness for a particular purpose. 8. I hereby waive and release [facility], its agents, employees, officers, directors, representatives, insurers, attorneys, assigns, successors, subsidiaries, and affiliates from any and all past, present, or future claims, demands, liabilities, actions, causes of action, or suits of any kind directly or indirectly arising from acceptance and use of the [employer]. 9. I further waive and release [facility] and its affiliates from all present and future liability and responsibility for any injury or death to persons or damages to property caused by or related to the use of the [employer]. 10. I have read this Waiver and Release of Liability, and I understand the terms used in it and their legal significance. This Waiver is freely and voluntarily given with the understanding that my right (as well as the right of any minor child for whom I am the parent or legal guardian using the [employer]) to legal recourse against [place] in connection with the [employer] is knowingly surrendered in return for use of these services.   I attest that I read the consent document to [name], gave [name] the opportunity to ask questions and answered the questions asked (if any). I affirm that [name] then provided consent for she's participation in this program.     [name]

## 2020-08-22 NOTE — Telephone Encounter (Signed)
TRANSITIONAL CARE MANAGEMENT TELEPHONE NOTE   Contact Date: 08/22/2020 Contacted By: [facility] Date of Discharge: 08-21-2020 Discharge Facility: [facility] Principal Discharge Diagnosis: fall   Outpatient Follow Up Recommendations   [name] is a female primary care patient of [place], [facility], [place]  and she  Can perform ADLs independently. her primary caregiver is herself . she is able to depend on family the them for consistent help. Transportation to appointments, to pick up medications, and to run errands is not a problem.  (Consider referral to [facility] if transportation or a consistent caregiver is a problem)   Fall Risk Fall Risk  08/06/2020 07/26/2020  Falls in the past year? 1 1  Number falls in past yr: 1 1  Comment - -  Injury with Fall? 0 1  Comment - -  Follow up Falls evaluation completed Falls evaluation completed    high [place] Modifications/Assistive Devices Wheelchair: no    Cane: no Ramp: no Bedside Toilet: no Hospital Bed no Other: [place] she is not receiving home health    MEDICATION RECONCILIATION (fulfills Medicare 30 day post-discharge medication reconciliation requirement) [name] has been able to pick-up all prescribed discharge medications from the pharmacy.   A post discharge medication reconciliation was performed and the complete medication list was reviewed with the patient/caregiver and is current as of 08/22/2020. Changes highlighted below.  Discontinued Medications   Current Medication List Allergies as of 08/22/2020      Reactions   Metformin Diarrhea      Medication List       Accurate as of August 22, 2020  1:47 PM. If you have any questions, ask your nurse or doctor.        Accu-Chek Softclix Lancets lancets 1 each by Other route 3 (three) times daily. as directed     allopurinol 300 MG tablet Commonly known as: ZYLOPRIM Take 300 mg by mouth daily.   amiodarone 200 MG tablet Commonly known as: PACERONE Take 1 tablet (200 mg total) by mouth daily.   apixaban 5 MG Tabs tablet Commonly known as: Eliquis Take 1 tablet (5 mg total) by mouth 2 (two) times daily.   blood glucose meter kit and supplies Dispense based on patient and insurance preference. Use up to four times daily as directed. (FOR ICD-10 E10.9, E11.9).   cloNIDine 0.3 MG tablet Commonly known as: Catapres Take 1 tablet (0.3 mg total) by mouth 2 (two) times daily.   FreeStyle Libre 14 Day Reader [name] Use as directed.   Insulin Lispro Prot & Lispro (75-25) 100 UNIT/ML Kwikpen Commonly known as: HUMALOG 75/25 MIX Inject 15-21 Units into the skin with breakfast, with lunch, and with evening meal. Sliding scale insulin   levothyroxine 75 MCG tablet Commonly known as: SYNTHROID Take 75 mcg daily before breakfast by mouth.   loperamide 2 MG capsule Commonly known as: IMODIUM Take 1 capsule (2 mg total) by mouth 4 (four) times daily as needed for diarrhea or loose stools.   losartan 100 MG tablet Commonly known as: COZAAR Take 1 tablet (100 mg total) by mouth daily.   NovoFine Plus 32G X 4 MM Misc Generic drug: Insulin Pen Needle 1 each by Subdermal route 2 (two) times daily. as directed   oxyCODONE-acetaminophen 5-325 MG tablet Commonly known as: PERCOCET/ROXICET Take 2 tablets by mouth every  4 (four) hours as needed for up to 3 days for severe pain.   Ozempic (0.25 or 0.5 MG/DOSE) 2 MG/1.5ML Sopn Generic drug: Semaglutide(0.25 or 0.5MG/DOS) Inject 0.75 mLs (1 mg total) into the skin once a week.   potassium chloride 10 MEQ tablet Commonly known as: KLOR-CON Take 2 tablets (20 mEq total) by mouth daily.   Restasis MultiDose 0.05 % ophthalmic emulsion Generic drug: cycloSPORINE Place 1 drop into both eyes 2 (two) times daily.   rosuvastatin 10 MG tablet Commonly known  as: CRESTOR Take 10 mg by mouth every evening.   torsemide 20 MG tablet Commonly known as: DEMADEX Take 2 tablets (40 mg total) by mouth daily.   traMADol 50 MG tablet Commonly known as: ULTRAM Take 50 mg by mouth in the morning, at noon, and at bedtime.   triamcinolone cream 0.1 % Commonly known as: KENALOG Apply 1 application topically 2 (two) times daily.   verapamil 120 MG tablet Commonly known as: Calan Take 1 tablet (120 mg total) by mouth 2 (two) times daily.        PATIENT EDUCATION & FOLLOW-UP PLAN  An appointment for Transitional Care Management is scheduled with Dr. Linna Darner  on 11-4 at 1:20  Take all medications as prescribed  Contact our office by calling [phone] if you have any questions or concerns

## 2020-08-22 NOTE — Telephone Encounter (Signed)
Attempted phone call to pt and left voicemail message to contact RN at [phone]. 

## 2020-08-22 NOTE — Telephone Encounter (Signed)
° ° °  Pt said she called 911 last night and was brought to [facility] ED. She said today she couldn't walk and on a lot of pain, she wanted to ask Dr. Caryl Comes if he can check the result of her lab work in the ED

## 2020-08-22 NOTE — Telephone Encounter (Signed)
Attempted phone call to pt.  Left voicemail message to contact RN at [phone]. 

## 2020-08-23 ENCOUNTER — Telehealth: Payer: Self-pay | Admitting: Emergency Medicine

## 2020-08-23 ENCOUNTER — Ambulatory Visit: Payer: Medicare Other | Admitting: Family Medicine

## 2020-08-23 DIAGNOSIS — R296 Repeated falls: Secondary | ICD-10-CM

## 2020-08-23 NOTE — Telephone Encounter (Signed)
Spoke with pt and advised per PharmD she may take Diltiazem 30mg  prn as directed.  Pt verbalizes understanding and thanked Therapist, sports for call.

## 2020-08-23 NOTE — Telephone Encounter (Signed)
Spoke with pt who states she had labs complete 08/21/2020 at hospital ED d/t fall.  BMET shows K+ WNL at 3.5.  Result note has been routed to Dr Caryl Comes with current K+ level.  Pt has appointment with her PCP today for f/u.  Pt thanked Therapist, sports for phone call.

## 2020-08-23 NOTE — Telephone Encounter (Signed)
 Mackie, registered nurse from [facility], [place] had a fall and was taken to the ED. She states that the pt has been discharged, but that the pt does not have any home assistance. She states that the pt has not been able to take her medications, get dressed, or use the bathroom because she is not able to wipe herself. She is requesting to have the pt set up with [facility] evaluation. Please advise.      [phone] ext [phone]

## 2020-08-24 ENCOUNTER — Telehealth: Payer: Self-pay | Admitting: Emergency Medicine

## 2020-08-24 ENCOUNTER — Ambulatory Visit: Payer: Medicare Other | Admitting: Physical Therapy

## 2020-08-24 NOTE — Telephone Encounter (Signed)
08/06/2020 last OV pt needs home health as she is having trouble caring for herself post hospital visit no apparent follow up post hospitalization please advise

## 2020-08-24 NOTE — Telephone Encounter (Signed)
Pt is calling to report she needs pain killers like the ER prescribed / tried to schedule appt for next week/  Pt refused/ suggested urgent care over weekend

## 2020-08-24 NOTE — Telephone Encounter (Signed)
Pt must have appt we will not prescribe any pain medication without being seen

## 2020-08-26 NOTE — Telephone Encounter (Signed)
Please order home health care. Thanks.

## 2020-08-27 NOTE — Telephone Encounter (Signed)
LVMTCB to schedule appt to get medication that was proscribed when she went to ED.

## 2020-08-28 ENCOUNTER — Ambulatory Visit: Payer: Medicare Other

## 2020-08-29 ENCOUNTER — Ambulatory Visit (INDEPENDENT_AMBULATORY_CARE_PROVIDER_SITE_OTHER): Payer: Medicare Other | Admitting: Orthopaedic Surgery

## 2020-08-29 ENCOUNTER — Encounter: Payer: Self-pay | Admitting: Orthopaedic Surgery

## 2020-08-29 VITALS — Ht 60.0 in | Wt 251.4 lb

## 2020-08-29 DIAGNOSIS — M25511 Pain in right shoulder: Secondary | ICD-10-CM | POA: Diagnosis not present

## 2020-08-29 MED ORDER — TIZANIDINE HCL 4 MG PO TABS: 4.0000 mg | ORAL_TABLET | Freq: Three times a day (TID) | ORAL | 1 refills | Status: DC | PRN

## 2020-08-29 MED ORDER — TRAMADOL HCL 50 MG PO TABS
100.0000 mg | ORAL_TABLET | Freq: Four times a day (QID) | ORAL | 0 refills | Status: DC | PRN
Start: 2020-08-29 — End: 2020-10-08

## 2020-08-29 MED ORDER — LIDOCAINE HCL 1 % IJ SOLN
3.0000 mL | INTRAMUSCULAR | Status: AC | PRN
  Administered 2020-08-29: 3 mL

## 2020-08-29 MED ORDER — METHYLPREDNISOLONE ACETATE 40 MG/ML IJ SUSP
40.0000 mg | INTRAMUSCULAR | Status: AC | PRN
  Administered 2020-08-29: 40 mg via INTRA_ARTICULAR

## 2020-08-29 MED ORDER — LIDOCAINE 5 % EX PTCH: 1.0000 | MEDICATED_PATCH | CUTANEOUS | 0 refills | Status: DC

## 2020-08-29 NOTE — Progress Notes (Signed)
Office Visit Note   Patient: [name]           Date of Birth: 1941/09/06           MRN: [id number] Visit Date: 08/29/2020              Requested by: Horald Pollen, MD [place],  [place] [address] PCP: Horald Pollen, MD   Assessment & Plan: Visit Diagnoses:  1. Acute pain of right shoulder     Plan: I did go over the x-rays with the patient's family in terms of describing what they show.  I recommended a steroid injection in the right shoulder subacromial space and she agreed to this.  She tolerated well.  I counseled her significantly about the detrimental effects this can have on her blood glucose and to watch this closely over the next few days.  I will send in a combination of Lidoderm patches for the shoulder and knees combined with tramadol and Zanaflex.  All question concerns were answered and addressed.  Follow-up will be as needed.  Follow-Up Instructions: Return if symptoms worsen or fail to improve.   Orders:  Orders Placed This Encounter  Procedures  . Large Joint Inj   Meds ordered this encounter  Medications  . lidocaine (LIDODERM) 5 %    Sig: Place 1 patch onto the skin daily. Remove & Discard patch within 12 hours or as directed by MD    Dispense:  30 patch    Refill:  0  . tiZANidine (ZANAFLEX) 4 MG tablet    Sig: Take 1 tablet (4 mg total) by mouth every 8 (eight) hours as needed for muscle spasms.    Dispense:  60 tablet    Refill:  1  . traMADol (ULTRAM) 50 MG tablet    Sig: Take 2 tablets (100 mg total) by mouth every 6 (six) hours as needed.    Dispense:  40 tablet    Refill:  0      Procedures: Large Joint Inj: R subacromial bursa on 08/29/2020 5:11 PM Indications: pain and diagnostic evaluation Details: 22 G 1.5 in needle  Arthrogram: No  Medications: 3 mL lidocaine 1 %; 40 mg methylPREDNISolone acetate 40 MG/ML Outcome: tolerated well, no immediate complications Procedure, treatment alternatives, risks and  benefits explained, specific risks discussed. Consent was given by the patient. Immediately prior to procedure a time out was called to verify the correct patient, procedure, equipment, support staff and site/side marked as required. Patient was prepped and draped in the usual sterile fashion.       Clinical Data: No additional findings.   Subjective: Chief Complaint  Patient presents with  . Right Shoulder - Pain  . Left Knee - Pain  . Right Knee - Pain  The patient comes in today for evaluation treatment of acute right shoulder pain and left knee pain after mechanical fall.  She is someone who is morbidly obese with a BMI of almost 50.  She fell again on November 2 and went to the emergency room.  She reports pain with both knees but also right shoulder pain.  She says she cannot move her right shoulder well.  There are x-rays in the canopy system of both her knees as well as her right shoulder.  I did review all of these and there was no acute fracture that can be seen at all the radiologist also reports no obvious fracture.  She is a diabetic and on insulin.  She does not mobilize well due to chronic knee pain and her morbid obesity.  HPI  Review of Systems She currently denies any headache, chest pain, shortness of breath, fever, chills, nausea, vomiting  Objective: Vital Signs: Ht 5' (1.524 m)   Wt 251 lb 6.4 oz (114 kg)   BMI 49.10 kg/m   Physical Exam She is alert and orient x3 and in no acute distress Ortho Exam Examination of her right shoulder shows severe pain with any attempts of motion of the shoulder with severe guarding but her shoulder is clinically located and wants to calm her down I can get it moving easily.  There is potentially some weakness in the rotator cuff but is really hard to tell.  Both knees have medial joint line tenderness but no bruising and no effusion with limited motion.  A lot of this is secondary to her pain and her obesity. Specialty Comments:    No specialty comments available.  Imaging: No results found. X-rays on the canopy system of the right shoulder were negative for any acute injury.  X-rays of the knee showed no fracture and just some mild about arthritic changes.  PMFS History: Patient Active Problem List   Diagnosis Date Noted  . Persistent atrial fibrillation ([place]) 08/12/2020  . Type 2 diabetes mellitus with hyperglycemia, with long-term current use of insulin ([place]) 06/21/2020  . Type 2 diabetes mellitus with diabetic polyneuropathy, with long-term current use of insulin ([place]) 06/21/2020  . Mixed hyperlipidemia 06/21/2020  . Uncontrolled hypertension 06/11/2020  . Aortic atherosclerosis ([place]) 03/28/2020  . Diabetic peripheral neuropathy ([place]) 12/26/2019  . Obesity 12/19/2015  . Hypersomnia 12/19/2015  . Acute diastolic congestive heart failure, NYHA class 3 ([place]) 10/03/2013  . Prolonged QT interval 10/03/2013  . Acute diastolic heart failure ([place]) 10/03/2013  . Long term (current) use of anticoagulants 06/09/2012  . BENIGN NEOPLASM OF ADRENAL GLAND 11/25/2010  . Chronic diastolic heart failure ([place]) 02/20/2010  . DM 05/16/2009  . GOUT 05/16/2009  . OBESITY, MORBID 05/16/2009  . Hypertension associated with diabetes ([place]) 05/16/2009  . Paroxysmal atrial fibrillation ([place]) 05/16/2009  . HYPERLIPIDEMIA 11/30/2008  . Obstructive sleep apnea on CPAP 11/30/2008   Past Medical History:  Diagnosis Date  . Arthritis   . Back pain   . Chronic anticoagulation    due to aflutter  . Chronic kidney disease   . Diabetes mellitus   . Diastolic CHF, chronic ([place])    a.  echo 2006 - ef 55-65%; mild diast dysfxn;    b. Echo 08/2011: Mild LVH, EF 60%;  c. 04/2013 Echo: EF 65-69%, mild conc LVH;  08/2014 Echo: EF 60-65%, mild-mod MR.  . Gout   . Hyperlipidemia   . Hypertension    a.  Renal arterial Dopplers 12/2011: 1-59% right renal artery stenosis  . Morbid obesity ([place])   . Obstructive sleep apnea on CPAP   .  Paroxysmal Afib/Flutter    a. 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 Heart disease Father   . Hypertension Father   . Breast cancer Sister   . Cancer Sister        breast    Past Surgical History:  Procedure Laterality Date  . APPENDECTOMY    . ATRIAL FLUTTER ABLATION N/A 09/24/2011   Procedure: ATRIAL FLUTTER ABLATION;  Surgeon: [name], MD;  Location: [facility] CATH LAB;  Service: Cardiovascular;  Laterality: N/A;  . CARDIOVERSION  10/22/2011  Procedure: CARDIOVERSION;  Surgeon: [name], MD;  Location: [facility];  Service: Cardiovascular;  Laterality: N/A;  . CARDIOVERSION N/A 09/10/2011   Procedure: CARDIOVERSION;  Surgeon: [name], MD;  Location: [facility] CATH LAB;  Service: Cardiovascular;  Laterality: N/A;  . CHOLECYSTECTOMY    . TONSILLECTOMY  1982  . TOTAL ABDOMINAL HYSTERECTOMY     Social History   Occupational History  . Occupation: DISABILITY/housewife    Employer: RETIRED  Tobacco Use  . Smoking status: Never Smoker  . Smokeless tobacco: Never Used  Vaping Use  . Vaping Use: Never used  Substance and Sexual Activity  . Alcohol use: No  . Drug use: No  . Sexual activity: Yes

## 2020-08-30 ENCOUNTER — Encounter: Payer: Medicare Other | Admitting: Physical Therapy

## 2020-08-31 ENCOUNTER — Encounter: Payer: Self-pay | Admitting: Registered Nurse

## 2020-08-31 ENCOUNTER — Ambulatory Visit (INDEPENDENT_AMBULATORY_CARE_PROVIDER_SITE_OTHER): Payer: Medicare Other | Admitting: Registered Nurse

## 2020-08-31 ENCOUNTER — Other Ambulatory Visit: Payer: Self-pay

## 2020-08-31 VITALS — BP 129/64 | HR 61 | Temp 98.0°F | Resp 18 | Ht 61.0 in | Wt 255.2 lb

## 2020-08-31 DIAGNOSIS — M25511 Pain in right shoulder: Secondary | ICD-10-CM

## 2020-08-31 NOTE — Patient Instructions (Signed)
° ° ° °  If you have lab work done today you will be contacted with your lab results within the next 2 weeks.  If you have not heard from us then please contact us. The fastest way to get your results is to register for My Chart. ° ° °IF you received an x-ray today, you will receive an invoice from [facility]. Please contact [facility] at [phone] with questions or concerns regarding your invoice.  ° °IF you received labwork today, you will receive an invoice from [facility]. Please contact [facility] at [phone] with questions or concerns regarding your invoice.  ° °Our billing staff will not be able to assist you with questions regarding bills from these companies. ° °You will be contacted with the lab results as soon as they are available. The fastest way to get your results is to activate your My Chart account. Instructions are located on the last page of this paperwork. If you have not heard from us regarding the results in 2 weeks, please contact this office. °  ° ° ° °

## 2020-09-01 MED ORDER — HYDROCODONE-ACETAMINOPHEN 5-325 MG PO TABS: 1.0000 | ORAL_TABLET | Freq: Two times a day (BID) | ORAL | 0 refills | Status: DC | PRN

## 2020-09-03 DIAGNOSIS — E1165 Type 2 diabetes mellitus with hyperglycemia: Secondary | ICD-10-CM | POA: Diagnosis not present

## 2020-09-03 DIAGNOSIS — R296 Repeated falls: Secondary | ICD-10-CM | POA: Diagnosis not present

## 2020-09-03 DIAGNOSIS — I130 Hypertensive heart and chronic kidney disease with heart failure and stage 1 through stage 4 chronic kidney disease, or unspecified chronic kidney disease: Secondary | ICD-10-CM | POA: Diagnosis not present

## 2020-09-03 DIAGNOSIS — G8911 Acute pain due to trauma: Secondary | ICD-10-CM | POA: Diagnosis not present

## 2020-09-03 DIAGNOSIS — E1142 Type 2 diabetes mellitus with diabetic polyneuropathy: Secondary | ICD-10-CM | POA: Diagnosis not present

## 2020-09-03 DIAGNOSIS — E1122 Type 2 diabetes mellitus with diabetic chronic kidney disease: Secondary | ICD-10-CM | POA: Diagnosis not present

## 2020-09-03 DIAGNOSIS — I5032 Chronic diastolic (congestive) heart failure: Secondary | ICD-10-CM | POA: Diagnosis not present

## 2020-09-03 DIAGNOSIS — I480 Paroxysmal atrial fibrillation: Secondary | ICD-10-CM | POA: Diagnosis not present

## 2020-09-03 DIAGNOSIS — M25511 Pain in right shoulder: Secondary | ICD-10-CM | POA: Diagnosis not present

## 2020-09-04 ENCOUNTER — Telehealth: Payer: Self-pay | Admitting: Physical Therapy

## 2020-09-04 ENCOUNTER — Ambulatory Visit: Payer: Medicare Other | Admitting: Physical Therapy

## 2020-09-04 NOTE — Telephone Encounter (Signed)
Spoke with patient regarding NS today- reports she called to cancel all of her appointments as she fell again and is having [place]. Rishabh Rinkenberger PT, DPT 09/04/20 6:32 PM

## 2020-09-05 DIAGNOSIS — M25511 Pain in right shoulder: Secondary | ICD-10-CM | POA: Diagnosis not present

## 2020-09-05 DIAGNOSIS — E1122 Type 2 diabetes mellitus with diabetic chronic kidney disease: Secondary | ICD-10-CM | POA: Diagnosis not present

## 2020-09-05 DIAGNOSIS — I130 Hypertensive heart and chronic kidney disease with heart failure and stage 1 through stage 4 chronic kidney disease, or unspecified chronic kidney disease: Secondary | ICD-10-CM | POA: Diagnosis not present

## 2020-09-05 DIAGNOSIS — I5032 Chronic diastolic (congestive) heart failure: Secondary | ICD-10-CM | POA: Diagnosis not present

## 2020-09-05 DIAGNOSIS — R296 Repeated falls: Secondary | ICD-10-CM | POA: Diagnosis not present

## 2020-09-05 DIAGNOSIS — I480 Paroxysmal atrial fibrillation: Secondary | ICD-10-CM | POA: Diagnosis not present

## 2020-09-05 DIAGNOSIS — G8911 Acute pain due to trauma: Secondary | ICD-10-CM | POA: Diagnosis not present

## 2020-09-05 DIAGNOSIS — E1142 Type 2 diabetes mellitus with diabetic polyneuropathy: Secondary | ICD-10-CM | POA: Diagnosis not present

## 2020-09-05 DIAGNOSIS — E1165 Type 2 diabetes mellitus with hyperglycemia: Secondary | ICD-10-CM | POA: Diagnosis not present

## 2020-09-06 ENCOUNTER — Encounter: Payer: Medicare Other | Admitting: Physical Therapy

## 2020-09-06 ENCOUNTER — Telehealth: Payer: Self-pay | Admitting: *Deleted

## 2020-09-06 NOTE — Telephone Encounter (Signed)
Faxed signed orders dated 09/03/2020 from [facility]. Confirmation page 1:42 pm.

## 2020-09-10 ENCOUNTER — Ambulatory Visit: Payer: Medicare Other | Admitting: Emergency Medicine

## 2020-09-10 DIAGNOSIS — H903 Sensorineural hearing loss, bilateral: Secondary | ICD-10-CM | POA: Insufficient documentation

## 2020-09-10 DIAGNOSIS — M25511 Pain in right shoulder: Secondary | ICD-10-CM | POA: Diagnosis not present

## 2020-09-10 DIAGNOSIS — E1142 Type 2 diabetes mellitus with diabetic polyneuropathy: Secondary | ICD-10-CM | POA: Diagnosis not present

## 2020-09-10 DIAGNOSIS — G8911 Acute pain due to trauma: Secondary | ICD-10-CM | POA: Diagnosis not present

## 2020-09-10 DIAGNOSIS — I5032 Chronic diastolic (congestive) heart failure: Secondary | ICD-10-CM | POA: Diagnosis not present

## 2020-09-10 DIAGNOSIS — E1165 Type 2 diabetes mellitus with hyperglycemia: Secondary | ICD-10-CM | POA: Diagnosis not present

## 2020-09-10 DIAGNOSIS — I130 Hypertensive heart and chronic kidney disease with heart failure and stage 1 through stage 4 chronic kidney disease, or unspecified chronic kidney disease: Secondary | ICD-10-CM | POA: Diagnosis not present

## 2020-09-10 DIAGNOSIS — E1122 Type 2 diabetes mellitus with diabetic chronic kidney disease: Secondary | ICD-10-CM | POA: Diagnosis not present

## 2020-09-10 DIAGNOSIS — I480 Paroxysmal atrial fibrillation: Secondary | ICD-10-CM | POA: Diagnosis not present

## 2020-09-10 DIAGNOSIS — R296 Repeated falls: Secondary | ICD-10-CM | POA: Diagnosis not present

## 2020-09-10 DIAGNOSIS — H9313 Tinnitus, bilateral: Secondary | ICD-10-CM | POA: Diagnosis not present

## 2020-09-11 ENCOUNTER — Encounter: Payer: Self-pay | Admitting: Emergency Medicine

## 2020-09-11 ENCOUNTER — Encounter: Payer: Medicare Other | Admitting: Physical Therapy

## 2020-09-11 ENCOUNTER — Other Ambulatory Visit: Payer: Self-pay

## 2020-09-11 ENCOUNTER — Ambulatory Visit (INDEPENDENT_AMBULATORY_CARE_PROVIDER_SITE_OTHER): Payer: Medicare Other | Admitting: Emergency Medicine

## 2020-09-11 VITALS — BP 160/67 | HR 60 | Temp 98.1°F | Resp 16 | Ht 61.0 in | Wt 255.0 lb

## 2020-09-11 DIAGNOSIS — Z9989 Dependence on other enabling machines and devices: Secondary | ICD-10-CM

## 2020-09-11 DIAGNOSIS — I482 Chronic atrial fibrillation, unspecified: Secondary | ICD-10-CM | POA: Diagnosis not present

## 2020-09-11 DIAGNOSIS — G4733 Obstructive sleep apnea (adult) (pediatric): Secondary | ICD-10-CM

## 2020-09-11 DIAGNOSIS — E782 Mixed hyperlipidemia: Secondary | ICD-10-CM

## 2020-09-11 DIAGNOSIS — E1159 Type 2 diabetes mellitus with other circulatory complications: Secondary | ICD-10-CM | POA: Diagnosis not present

## 2020-09-11 DIAGNOSIS — Z13820 Encounter for screening for osteoporosis: Secondary | ICD-10-CM

## 2020-09-11 DIAGNOSIS — I5032 Chronic diastolic (congestive) heart failure: Secondary | ICD-10-CM

## 2020-09-11 DIAGNOSIS — Z7901 Long term (current) use of anticoagulants: Secondary | ICD-10-CM

## 2020-09-11 DIAGNOSIS — I152 Hypertension secondary to endocrine disorders: Secondary | ICD-10-CM

## 2020-09-11 DIAGNOSIS — R296 Repeated falls: Secondary | ICD-10-CM | POA: Diagnosis not present

## 2020-09-11 DIAGNOSIS — I700 Atherosclerosis of aorta: Secondary | ICD-10-CM | POA: Diagnosis not present

## 2020-09-11 NOTE — Progress Notes (Signed)
[name] 79 y.o.   Chief Complaint  Patient presents with  . Hypertension    follow up 3 month  . Diabetes    HISTORY OF PRESENT ILLNESS: This is a 79 y.o. female with history of diabetes and hypertension here for follow-up. States her blood pressure yesterday was 130/70 when physical therapist recorded it at home. Has not taken her blood pressure medications yet today. Sees endocrinologist on a regular basis.  Her hemoglobin A1c has significantly improved over the past several months. Recently in the emergency department due to a fall.  No broken bones.  08/21/2020 ED visit reviewed.  Multiple contusions. Today she feels better.  No particular complaints or medical concerns. Lab Results  Component Value Date   HGBA1C 7.9 (A) 03/28/2020   BP Readings from Last 3 Encounters:  09/11/20 (!) 199/75  08/31/20 129/64  08/21/20 (!) 139/44    HPI   Prior to Admission medications   Medication Sig Start Date End Date Taking? Authorizing Provider  allopurinol (ZYLOPRIM) 300 MG tablet Take 300 mg by mouth daily.  07/07/13  Yes [provider]  amiodarone (PACERONE) 200 MG tablet Take 1 tablet (200 mg total) by mouth daily. 07/18/20  Yes Sherran Needs, NP  apixaban (ELIQUIS) 5 MG TABS tablet Take 1 tablet (5 mg total) by mouth 2 (two) times daily. 06/14/20  Yes [name], MD  cloNIDine (CATAPRES) 0.3 MG tablet Take 1 tablet (0.3 mg total) by mouth 2 (two) times daily. 06/08/20  Yes Posey Boyer, MD  Continuous Blood Gluc Receiver (FREESTYLE LIBRE 14 DAY READER) DEVI Use as directed. 04/18/20  Yes Aideen Fenster, Ines Bloomer, MD  diltiazem (CARDIZEM) 30 MG tablet Take 30 mg by mouth. Take one tablet every 4 hours as needed for heart rate greater than 100 and blood pressure needs to be above 100   Yes Sherran Needs, NP  HYDROcodone-acetaminophen (NORCO/VICODIN) 5-325 MG tablet Take 1 tablet by mouth 2 (two) times daily as needed for moderate pain. 09/01/20  Yes Maximiano Coss, NP  Insulin Lispro Prot & Lispro (HUMALOG 75/25 MIX) (75-25) 100 UNIT/ML Kwikpen Inject 15-21 Units into the skin with breakfast, with lunch, and with evening meal. Sliding scale insulin 08/12/16  Yes [provider]  levothyroxine (SYNTHROID, LEVOTHROID) 75 MCG tablet Take 75 mcg daily before breakfast by mouth.  12/29/16  Yes [provider]  loperamide (IMODIUM) 2 MG capsule Take 1 capsule (2 mg total) by mouth 4 (four) times daily as needed for diarrhea or loose stools. 03/20/20  Yes Garald Balding, PA-C  losartan (COZAAR) 100 MG tablet Take 1 tablet (100 mg total) by mouth daily. 05/31/20  Yes Adreona Brand, Ines Bloomer, MD  potassium chloride (KLOR-CON) 10 MEQ tablet Take 2 tablets (20 mEq total) by mouth daily. 10/18/19  Yes [name], MD  RESTASIS MULTIDOSE 0.05 % ophthalmic emulsion Place 1 drop into both eyes 2 (two) times daily.  03/04/20  Yes [provider]  rosuvastatin (CRESTOR) 10 MG tablet Take 10 mg by mouth every evening.   Yes [provider]  tiZANidine (ZANAFLEX) 4 MG tablet Take 1 tablet (4 mg total) by mouth every 8 (eight) hours as needed for muscle spasms. 08/29/20  Yes Mcarthur Rossetti, MD  torsemide (DEMADEX) 20 MG tablet Take 2 tablets (40 mg total) by mouth daily. 09/29/19  Yes [name], MD  traMADol (ULTRAM) 50 MG tablet Take 2 tablets (100 mg total) by mouth every 6 (six) hours  as needed. 08/29/20  Yes Mcarthur Rossetti, MD  triamcinolone cream (KENALOG) 0.1 % Apply 1 application topically 2 (two) times daily. 07/26/20  Yes Amedeo Detweiler, Ines Bloomer, MD  verapamil (CALAN) 120 MG tablet Take 1 tablet (120 mg total) by mouth 2 (two) times daily. 08/15/20  Yes [name], MD  Accu-Chek Softclix Lancets lancets 1 each by Other route 3 (three) times daily. as directed 03/30/20   Maximiano Coss, NP  blood glucose meter kit and supplies Dispense based on patient and insurance preference. Use up to four times daily as  directed. (FOR ICD-10 E10.9, E11.9). 05/03/20   Horald Pollen, MD  lidocaine (LIDODERM) 5 % Place 1 patch onto the skin daily. Remove & Discard patch within 12 hours or as directed by MD 08/29/20   Mcarthur Rossetti, MD  NOVOFINE PLUS 32G X 4 MM MISC 1 each by Subdermal route 2 (two) times daily. as directed 03/30/20   Maximiano Coss, NP  Semaglutide,0.25 or 0.5MG /DOS, (OZEMPIC, 0.25 OR 0.5 MG/DOSE,) 2 MG/1.5ML SOPN Inject 0.75 mLs (1 mg total) into the skin once a week. Patient not taking: Reported on 09/11/2020 04/18/20   Horald Pollen, MD    Allergies  Allergen Reactions  . Metformin Diarrhea    Patient Active Problem List   Diagnosis Date Noted  . Persistent atrial fibrillation ([place]) 08/12/2020  . Type 2 diabetes mellitus with hyperglycemia, with long-term current use of insulin ([place]) 06/21/2020  . Type 2 diabetes mellitus with diabetic polyneuropathy, with long-term current use of insulin ([place]) 06/21/2020  . Mixed hyperlipidemia 06/21/2020  . Uncontrolled hypertension 06/11/2020  . Aortic atherosclerosis ([place]) 03/28/2020  . Diabetic peripheral neuropathy ([place]) 12/26/2019  . Obesity 12/19/2015  . Hypersomnia 12/19/2015  . Acute diastolic congestive heart failure, NYHA class 3 ([place]) 10/03/2013  . Prolonged QT interval 10/03/2013  . Acute diastolic heart failure ([place]) 10/03/2013  . Long term (current) use of anticoagulants 06/09/2012  . BENIGN NEOPLASM OF ADRENAL GLAND 11/25/2010  . Chronic diastolic heart failure ([place]) 02/20/2010  . DM 05/16/2009  . GOUT 05/16/2009  . OBESITY, MORBID 05/16/2009  . Hypertension associated with diabetes ([place]) 05/16/2009  . Paroxysmal atrial fibrillation ([place]) 05/16/2009  . HYPERLIPIDEMIA 11/30/2008  . Obstructive sleep apnea on CPAP 11/30/2008    Past Medical History:  Diagnosis Date  . Arthritis   . Back pain   . Chronic anticoagulation    due to aflutter  . Chronic kidney disease   . Diabetes mellitus   . Diastolic  CHF, chronic ([place])    a.  echo 2006 - ef 55-65%; mild diast dysfxn;    b. Echo 08/2011: Mild LVH, EF 60%;  c. 04/2013 Echo: EF 65-69%, mild conc LVH;  08/2014 Echo: EF 60-65%, mild-mod MR.  . Gout   . Hyperlipidemia   . Hypertension    a.  Renal arterial Dopplers 12/2011: 1-59% right renal artery stenosis  . Morbid obesity ([place])   . Obstructive sleep apnea on CPAP   . Paroxysmal Afib/Flutter    a. 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 APPENDECTOMY    . ATRIAL FLUTTER ABLATION N/A 09/24/2011   Procedure: ATRIAL FLUTTER ABLATION;  Surgeon: [name], MD;  Location: [facility] CATH LAB;  Service: Cardiovascular;  Laterality: N/A;  . CARDIOVERSION  10/22/2011   Procedure: CARDIOVERSION;  Surgeon: [name], MD;  Location: [place];  Service: Cardiovascular;  Laterality: N/A;  . CARDIOVERSION N/A  09/10/2011   Procedure: CARDIOVERSION;  Surgeon: [name], MD;  Location: [facility] CATH LAB;  Service: Cardiovascular;  Laterality: N/A;  . CHOLECYSTECTOMY    . TONSILLECTOMY  1982  . TOTAL ABDOMINAL HYSTERECTOMY      Social History   Socioeconomic History  . Marital status: Married    Spouse name: Not on file  . Number of children: 3  . Years of education: Not on file  . Highest education level: Not on file  Occupational History  . Occupation: DISABILITY/housewife    Employer: RETIRED  Tobacco Use  . Smoking status: Never Smoker  . Smokeless tobacco: Never Used  Vaping Use  . Vaping Use: Never used  Substance and Sexual Activity  . Alcohol use: No  . Drug use: No  . Sexual activity: Yes  Other Topics Concern  . Not on file  Social History Narrative  . Not on file   Social Determinants of Health   Financial Resource Strain:   . Difficulty of Paying Living Expenses: Not on file  Food Insecurity:   . Worried About Charity fundraiser in the Last Year: Not on file  . Ran Out of Food in the Last Year: Not on file    Transportation Needs:   . Lack of Transportation (Medical): Not on file  . Lack of Transportation (Non-Medical): Not on file  Physical Activity:   . Days of Exercise per Week: Not on file  . Minutes of Exercise per Session: Not on file  Stress:   . Feeling of Stress : Not on file  Social Connections:   . Frequency of Communication with Friends and Family: Not on file  . Frequency of Social Gatherings with Friends and Family: Not on file  . Attends Religious Services: Not on file  . Active Member of Clubs or Organizations: Not on file  . Attends Archivist Meetings: Not on file  . Marital Status: Not on file  Intimate Partner Violence:   . Fear of Current or Ex-Partner: Not on file  . Emotionally Abused: Not on file  . Physically Abused: Not on file  . Sexually Abused: Not on file    Family History  Problem Relation Age of Onset  . Heart disease Father   . Hypertension Father   . Breast cancer Sister   . Cancer Sister        breast     Review of Systems  Constitutional: Negative.  Negative for chills and fever.  HENT: Negative.  Negative for congestion and sore throat.   Respiratory: Negative.  Negative for cough and shortness of breath.   Cardiovascular: Negative.  Negative for chest pain and palpitations.  Gastrointestinal: Negative.  Negative for abdominal pain, blood in stool, diarrhea, melena, nausea and vomiting.  Genitourinary: Negative.  Negative for dysuria and hematuria.  Musculoskeletal: Positive for back pain and joint pain.  Skin: Negative.  Negative for rash.  Neurological: Negative for dizziness and headaches.  All other systems reviewed and are negative.  Vitals:   09/11/20 1118 09/11/20 1211  BP: (!) 199/75 (!) 160/67  Pulse: 60   Resp: 16   Temp: 98.1 F (36.7 C)   SpO2: 96%    Wt Readings from Last 3 Encounters:  09/11/20 255 lb (115.7 kg)  08/31/20 255 lb 3.2 oz (115.8 kg)  08/29/20 251 lb 6.4 oz (114 kg)   Body mass index is  48.18 kg/m.     Physical Exam Vitals reviewed.  Constitutional:  Appearance: She is obese.  HENT:     Head: Normocephalic.  Eyes:     Extraocular Movements: Extraocular movements intact.     Conjunctiva/sclera: Conjunctivae normal.     Pupils: Pupils are equal, round, and reactive to light.  Cardiovascular:     Rate and Rhythm: Normal rate and regular rhythm.     Pulses: Normal pulses.     Heart sounds: Normal heart sounds.  Pulmonary:     Effort: Pulmonary effort is normal.     Breath sounds: Normal breath sounds.  Musculoskeletal:     Cervical back: Normal range of motion and neck supple. No tenderness.  Lymphadenopathy:     Cervical: No cervical adenopathy.  Skin:    General: Skin is warm and dry.     Capillary Refill: Capillary refill takes less than 2 seconds.  Neurological:     General: No focal deficit present.     Mental Status: She is alert and oriented to person, place, and time.  Psychiatric:        Mood and Affect: Mood normal.        Behavior: Behavior normal.    A total of 30 minutes was spent with the patient, greater than 50% of which was in counseling/coordination of care regarding multiple chronic medical problems and cardiovascular risks associated with these conditions, review of all medications, review of most recent office visit notes, review of most recent emergency department visit notes, review of most recent blood work results, health maintenance items, education on nutrition, prognosis, documentation, need for follow-up.   ASSESSMENT & PLAN: Jolea was seen today for hypertension and diabetes.  Diagnoses and all orders for this visit:  Hypertension associated with diabetes ([place]) -     Cancel: POCT glucose (manual entry) -     Cancel: POCT glycosylated hemoglobin (Hb A1C) -     CBC with Differential/Platelet; Future -     Comprehensive metabolic panel; Future -     Hemoglobin A1c; Future -     Lipid panel; Future  Chronic atrial  fibrillation (HCC)  Recurrent falls  Aortic atherosclerosis (HCC)  Morbid obesity ([place])  Long term (current) use of anticoagulants  Mixed hyperlipidemia  Obstructive sleep apnea on CPAP  Osteoporosis screening -     HM DEXA SCAN  Chronic diastolic heart failure (HCC)    Patient Instructions  Hypertension, Adult High blood pressure (hypertension) is when the force of blood pumping through the arteries is too strong. The arteries are the blood vessels that carry blood from the heart throughout the body. Hypertension forces the heart to work harder to pump blood and may cause arteries to become narrow or stiff. Untreated or uncontrolled hypertension can cause a heart attack, heart failure, a stroke, kidney disease, and other problems. A blood pressure reading consists of a higher number over a lower number. Ideally, your blood pressure should be below 120/80. The first ("top") number is called the systolic pressure. It is a measure of the pressure in your arteries as your heart beats. The second ("bottom") number is called the diastolic pressure. It is a measure of the pressure in your arteries as the heart relaxes. What are the causes? The exact cause of this condition is not known. There are some conditions that result in or are related to high blood pressure. What increases the risk? Some risk factors for high blood pressure are under your control. The following factors may make you more likely to develop this condition:  Smoking.  Having type 2 diabetes mellitus, high cholesterol, or both.  Not getting enough exercise or physical activity.  Being overweight.  Having too much fat, sugar, calories, or salt (sodium) in your diet.  Drinking too much alcohol. Some risk factors for high blood pressure may be difficult or impossible to change. Some of these factors include:  Having chronic kidney disease.  Having a family history of high blood pressure.  Age. Risk increases  with age.  Race. You may be at higher risk if you are African American.  Gender. Men are at higher risk than women before age 52. After age 33, women are at higher risk than men.  Having obstructive sleep apnea.  Stress. What are the signs or symptoms? High blood pressure may not cause symptoms. Very high blood pressure (hypertensive crisis) may cause:  Headache.  Anxiety.  Shortness of breath.  Nosebleed.  Nausea and vomiting.  Vision changes.  Severe chest pain.  Seizures. How is this diagnosed? This condition is diagnosed by measuring your blood pressure while you are seated, with your arm resting on a flat surface, your legs uncrossed, and your feet flat on the floor. The cuff of the blood pressure monitor will be placed directly against the skin of your upper arm at the level of your heart. It should be measured at least twice using the same arm. Certain conditions can cause a difference in blood pressure between your right and left arms. Certain factors can cause blood pressure readings to be lower or higher than normal for a short period of time:  When your blood pressure is higher when you are in a health care provider's office than when you are at home, this is called white coat hypertension. Most people with this condition do not need medicines.  When your blood pressure is higher at home than when you are in a health care provider's office, this is called masked hypertension. Most people with this condition may need medicines to control blood pressure. If you have a high blood pressure reading during one visit or you have normal blood pressure with other risk factors, you may be asked to:  Return on a different day to have your blood pressure checked again.  Monitor your blood pressure at home for 1 week or longer. If you are diagnosed with hypertension, you may have other blood or imaging tests to help your health care provider understand your overall risk for other  conditions. How is this treated? This condition is treated by making healthy lifestyle changes, such as eating healthy foods, exercising more, and reducing your alcohol intake. Your health care provider may prescribe medicine if lifestyle changes are not enough to get your blood pressure under control, and if:  Your systolic blood pressure is above 130.  Your diastolic blood pressure is above 80. Your personal target blood pressure may vary depending on your medical conditions, your age, and other factors. Follow these instructions at home: Eating and drinking   Eat a diet that is high in fiber and potassium, and low in sodium, added sugar, and fat. An example eating plan is called the DASH (Dietary Approaches to Stop Hypertension) diet. To eat this way: ? Eat plenty of fresh fruits and vegetables. Try to fill one half of your plate at each meal with fruits and vegetables. ? Eat whole grains, such as whole-wheat pasta, brown rice, or whole-grain bread. Fill about one fourth of your plate with whole grains. ? Eat or drink low-fat dairy products,  such as skim milk or low-fat yogurt. ? Avoid fatty cuts of meat, processed or cured meats, and poultry with skin. Fill about one fourth of your plate with lean proteins, such as fish, chicken without skin, beans, eggs, or tofu. ? Avoid pre-made and processed foods. These tend to be higher in sodium, added sugar, and fat.  Reduce your daily sodium intake. Most people with hypertension should eat less than 1,500 mg of sodium a day.  Do not drink alcohol if: ? Your health care provider tells you not to drink. ? You are pregnant, may be pregnant, or are planning to become pregnant.  If you drink alcohol: ? Limit how much you use to:  0-1 drink a day for women.  0-2 drinks a day for men. ? Be aware of how much alcohol is in your drink. In the [place] one drink equals one 12 oz bottle of beer (355 mL), one 5 oz glass of wine (148 mL), or one 1 oz glass  of hard liquor (44 mL). Lifestyle   Work with your health care provider to maintain a healthy body weight or to lose weight. Ask what an ideal weight is for you.  Get at least 30 minutes of exercise most days of the week. Activities may include walking, swimming, or biking.  Include exercise to strengthen your muscles (resistance exercise), such as Pilates or lifting weights, as part of your weekly exercise routine. Try to do these types of exercises for 30 minutes at least 3 days a week.  Do not use any products that contain nicotine or tobacco, such as cigarettes, e-cigarettes, and chewing tobacco. If you need help quitting, ask your health care provider.  Monitor your blood pressure at home as told by your health care provider.  Keep all follow-up visits as told by your health care provider. This is important. Medicines  Take over-the-counter and prescription medicines only as told by your health care provider. Follow directions carefully. Blood pressure medicines must be taken as prescribed.  Do not skip doses of blood pressure medicine. Doing this puts you at risk for problems and can make the medicine less effective.  Ask your health care provider about side effects or reactions to medicines that you should watch for. Contact a health care provider if you:  Think you are having a reaction to a medicine you are taking.  Have headaches that keep coming back (recurring).  Feel dizzy.  Have swelling in your ankles.  Have trouble with your vision. Get help right away if you:  Develop a severe headache or confusion.  Have unusual weakness or numbness.  Feel faint.  Have severe pain in your chest or abdomen.  Vomit repeatedly.  Have trouble breathing. Summary  Hypertension is when the force of blood pumping through your arteries is too strong. If this condition is not controlled, it may put you at risk for serious complications.  Your personal target blood pressure  may vary depending on your medical conditions, your age, and other factors. For most people, a normal blood pressure is less than 120/80.  Hypertension is treated with lifestyle changes, medicines, or a combination of both. Lifestyle changes include losing weight, eating a healthy, low-sodium diet, exercising more, and limiting alcohol. This information is not intended to replace advice given to you by your health care provider. Make sure you discuss any questions you have with your health care provider. Document Revised: 06/16/2018 Document Reviewed: 06/16/2018 Elsevier Patient Education  2020 [employer].  Myasia Sinatra, MD [facility] 

## 2020-09-11 NOTE — Patient Instructions (Signed)

## 2020-09-12 ENCOUNTER — Ambulatory Visit (INDEPENDENT_AMBULATORY_CARE_PROVIDER_SITE_OTHER): Payer: Medicare Other | Admitting: Emergency Medicine

## 2020-09-12 DIAGNOSIS — E876 Hypokalemia: Secondary | ICD-10-CM | POA: Diagnosis not present

## 2020-09-12 DIAGNOSIS — Z79899 Other long term (current) drug therapy: Secondary | ICD-10-CM | POA: Diagnosis not present

## 2020-09-12 DIAGNOSIS — I4891 Unspecified atrial fibrillation: Secondary | ICD-10-CM | POA: Diagnosis not present

## 2020-09-12 DIAGNOSIS — I152 Hypertension secondary to endocrine disorders: Secondary | ICD-10-CM | POA: Diagnosis not present

## 2020-09-12 DIAGNOSIS — E1159 Type 2 diabetes mellitus with other circulatory complications: Secondary | ICD-10-CM

## 2020-09-13 LAB — CBC WITH DIFFERENTIAL/PLATELET
Basophils Absolute: 0 10*3/uL (ref 0.0–0.2)
Basos: 0 %
EOS (ABSOLUTE): 0.3 10*3/uL (ref 0.0–0.4)
Eos: 2 %
Hematocrit: 39.1 % (ref 34.0–46.6)
Hemoglobin: 12.2 g/dL (ref 11.1–15.9)
Immature Grans (Abs): 0 10*3/uL (ref 0.0–0.1)
Immature Granulocytes: 0 %
Lymphocytes Absolute: 3.4 10*3/uL — ABNORMAL HIGH (ref 0.7–3.1)
Lymphs: 28 %
MCH: 26.2 pg — ABNORMAL LOW (ref 26.6–33.0)
MCHC: 31.2 g/dL — ABNORMAL LOW (ref 31.5–35.7)
MCV: 84 fL (ref 79–97)
Monocytes Absolute: 1.3 10*3/uL — ABNORMAL HIGH (ref 0.1–0.9)
Monocytes: 11 %
Neutrophils Absolute: 6.9 10*3/uL (ref 1.4–7.0)
Neutrophils: 59 %
Platelets: 350 10*3/uL (ref 150–450)
RBC: 4.65 x10E6/uL (ref 3.77–5.28)
RDW: 15.1 % (ref 11.7–15.4)
WBC: 11.9 10*3/uL — ABNORMAL HIGH (ref 3.4–10.8)

## 2020-09-13 LAB — COMPREHENSIVE METABOLIC PANEL
ALT: 11 IU/L (ref 0–32)
AST: 11 IU/L (ref 0–40)
Albumin/Globulin Ratio: 1.6 (ref 1.2–2.2)
Albumin: 4 g/dL (ref 3.7–4.7)
Alkaline Phosphatase: 136 IU/L — ABNORMAL HIGH (ref 44–121)
BUN/Creatinine Ratio: 19 (ref 12–28)
BUN: 15 mg/dL (ref 8–27)
Bilirubin Total: 0.4 mg/dL (ref 0.0–1.2)
CO2: 32 mmol/L — ABNORMAL HIGH (ref 20–29)
Calcium: 8.9 mg/dL (ref 8.7–10.3)
Chloride: 99 mmol/L (ref 96–106)
Creatinine, Ser: 0.78 mg/dL (ref 0.57–1.00)
GFR calc Af Amer: 85 mL/min/{1.73_m2} (ref >59)
GFR calc non Af Amer: 74 mL/min/{1.73_m2} (ref >59)
Globulin, Total: 2.5 g/dL (ref 1.5–4.5)
Glucose: 127 mg/dL — ABNORMAL HIGH (ref 65–99)
Potassium: 3.4 mmol/L — ABNORMAL LOW (ref 3.5–5.2)
Sodium: 145 mmol/L — ABNORMAL HIGH (ref 134–144)
Total Protein: 6.5 g/dL (ref 6.0–8.5)

## 2020-09-13 LAB — LIPID PANEL
Chol/HDL Ratio: 2.2 ratio (ref 0.0–4.4)
Cholesterol, Total: 135 mg/dL (ref 100–199)
HDL: 62 mg/dL (ref >39)
LDL Chol Calc (NIH): 57 mg/dL (ref 0–99)
Triglycerides: 84 mg/dL (ref 0–149)
VLDL Cholesterol Cal: 16 mg/dL (ref 5–40)

## 2020-09-13 LAB — MAGNESIUM: Magnesium: 2.1 mg/dL (ref 1.6–2.3)

## 2020-09-13 LAB — HEMOGLOBIN A1C
Est. average glucose Bld gHb Est-mCnc: 174 mg/dL
Hgb A1c MFr Bld: 7.7 % — ABNORMAL HIGH (ref 4.8–5.6)

## 2020-09-15 ENCOUNTER — Telehealth: Payer: Self-pay | Admitting: Physician Assistant

## 2020-09-15 NOTE — Telephone Encounter (Signed)
Pt called stating her Afib was "acting up" and she wants to know if its ok to take her 30 mg cardizem with her other medications. Medications reviewed. I advised her to take 30 mg cardizem. May take again in 4-6 hours if her HR is still greater than 100.

## 2020-09-17 ENCOUNTER — Encounter: Payer: Self-pay | Admitting: Radiology

## 2020-09-17 DIAGNOSIS — I5032 Chronic diastolic (congestive) heart failure: Secondary | ICD-10-CM | POA: Diagnosis not present

## 2020-09-17 DIAGNOSIS — M25511 Pain in right shoulder: Secondary | ICD-10-CM | POA: Diagnosis not present

## 2020-09-17 DIAGNOSIS — E1122 Type 2 diabetes mellitus with diabetic chronic kidney disease: Secondary | ICD-10-CM | POA: Diagnosis not present

## 2020-09-17 DIAGNOSIS — R296 Repeated falls: Secondary | ICD-10-CM | POA: Diagnosis not present

## 2020-09-17 DIAGNOSIS — E1165 Type 2 diabetes mellitus with hyperglycemia: Secondary | ICD-10-CM | POA: Diagnosis not present

## 2020-09-17 DIAGNOSIS — G8911 Acute pain due to trauma: Secondary | ICD-10-CM | POA: Diagnosis not present

## 2020-09-17 DIAGNOSIS — I130 Hypertensive heart and chronic kidney disease with heart failure and stage 1 through stage 4 chronic kidney disease, or unspecified chronic kidney disease: Secondary | ICD-10-CM | POA: Diagnosis not present

## 2020-09-17 DIAGNOSIS — I480 Paroxysmal atrial fibrillation: Secondary | ICD-10-CM | POA: Diagnosis not present

## 2020-09-17 DIAGNOSIS — E1142 Type 2 diabetes mellitus with diabetic polyneuropathy: Secondary | ICD-10-CM | POA: Diagnosis not present

## 2020-09-18 ENCOUNTER — Telehealth: Payer: Self-pay | Admitting: *Deleted

## 2020-09-18 ENCOUNTER — Encounter: Payer: Medicare Other | Admitting: Physical Therapy

## 2020-09-18 NOTE — Telephone Encounter (Signed)
Faxed Rx request for Losartan with Strength, Sig and Qty with 3 refills to [facility]. Confirmation 12:43 pm.

## 2020-09-19 DIAGNOSIS — M25511 Pain in right shoulder: Secondary | ICD-10-CM | POA: Diagnosis not present

## 2020-09-19 DIAGNOSIS — I5032 Chronic diastolic (congestive) heart failure: Secondary | ICD-10-CM | POA: Diagnosis not present

## 2020-09-19 DIAGNOSIS — G8911 Acute pain due to trauma: Secondary | ICD-10-CM | POA: Diagnosis not present

## 2020-09-19 DIAGNOSIS — E1165 Type 2 diabetes mellitus with hyperglycemia: Secondary | ICD-10-CM | POA: Diagnosis not present

## 2020-09-19 DIAGNOSIS — E1122 Type 2 diabetes mellitus with diabetic chronic kidney disease: Secondary | ICD-10-CM | POA: Diagnosis not present

## 2020-09-19 DIAGNOSIS — I130 Hypertensive heart and chronic kidney disease with heart failure and stage 1 through stage 4 chronic kidney disease, or unspecified chronic kidney disease: Secondary | ICD-10-CM | POA: Diagnosis not present

## 2020-09-19 DIAGNOSIS — R296 Repeated falls: Secondary | ICD-10-CM | POA: Diagnosis not present

## 2020-09-19 DIAGNOSIS — I480 Paroxysmal atrial fibrillation: Secondary | ICD-10-CM | POA: Diagnosis not present

## 2020-09-19 DIAGNOSIS — E1142 Type 2 diabetes mellitus with diabetic polyneuropathy: Secondary | ICD-10-CM | POA: Diagnosis not present

## 2020-09-20 ENCOUNTER — Encounter: Payer: Medicare Other | Admitting: Physical Therapy

## 2020-09-21 ENCOUNTER — Telehealth: Payer: Self-pay | Admitting: Emergency Medicine

## 2020-09-21 DIAGNOSIS — R29818 Other symptoms and signs involving the nervous system: Secondary | ICD-10-CM

## 2020-09-21 DIAGNOSIS — Z1231 Encounter for screening mammogram for malignant neoplasm of breast: Secondary | ICD-10-CM

## 2020-09-21 NOTE — Telephone Encounter (Signed)
Pt saw you 09/12/2020 but is now requesting these referrals do you want a new visit?

## 2020-09-21 NOTE — Telephone Encounter (Signed)
Pt called in with multiple requests. She would like an order for her mammogram placed.    She would like an order placed for a sleep apnea test for her.  She would like to know if she is suppose to be taking amlodipine?  The order that was placed for her to get a walker needs to have 4 wheels and a seat. The other is to small.  This is all the info she had.   Please advise at [phone].

## 2020-09-22 NOTE — Telephone Encounter (Signed)
No new visit needed. Please accept her requests. Thanks.

## 2020-09-24 ENCOUNTER — Encounter: Payer: Self-pay | Admitting: Internal Medicine

## 2020-09-24 ENCOUNTER — Ambulatory Visit (INDEPENDENT_AMBULATORY_CARE_PROVIDER_SITE_OTHER): Payer: Medicare Other | Admitting: Internal Medicine

## 2020-09-24 ENCOUNTER — Other Ambulatory Visit: Payer: Self-pay

## 2020-09-24 VITALS — BP 146/82 | HR 62 | Ht 61.0 in | Wt 254.0 lb

## 2020-09-24 DIAGNOSIS — E1142 Type 2 diabetes mellitus with diabetic polyneuropathy: Secondary | ICD-10-CM

## 2020-09-24 DIAGNOSIS — E1165 Type 2 diabetes mellitus with hyperglycemia: Secondary | ICD-10-CM

## 2020-09-24 DIAGNOSIS — Z794 Long term (current) use of insulin: Secondary | ICD-10-CM

## 2020-09-24 MED ORDER — FREESTYLE LIBRE 14 DAY SENSOR MISC: 3 refills | Status: DC

## 2020-09-24 NOTE — Patient Instructions (Addendum)
-   Humalog Mix 15 units with Breakfast and 15 units with Supper  - Restart Ozempic 0.25 mg weekly for 6 weeks, if no side effects please increase to 0.5 mg weekly       HOW TO TREAT LOW BLOOD SUGARS (Blood sugar LESS THAN 70 MG/DL)  Please follow the RULE OF 15 for the treatment of hypoglycemia treatment (when your (blood sugars are less than 70 mg/dL)    STEP 1: Take 15 grams of carbohydrates when your blood sugar is low, which includes:   3-4 GLUCOSE TABS  OR  3-4 OZ OF JUICE OR REGULAR SODA OR  ONE TUBE OF GLUCOSE GEL     STEP 2: RECHECK blood sugar in 15 MINUTES STEP 3: If your blood sugar is still low at the 15 minute recheck --> then, go back to STEP 1 and treat AGAIN with another 15 grams of carbohydrates.

## 2020-09-24 NOTE — Progress Notes (Signed)
Name: [name]  Age/ Sex: 79 y.o., female   MRN/ DOB: [id number], 03-Oct-1941     PCP: Georgina Quint, MD   Reason for Endocrinology Evaluation: Type 2 Diabetes Mellitus  Initial Endocrine Consultative Visit: 06/21/2020    PATIENT IDENTIFIER: [name] is a 79 y.o. female with a past medical history of HTN, PAF, CHF, OSA and Dyslipidemia . The patient has followed with Endocrinology clinic since 06/21/2020 for consultative assistance with management of her diabetes.  DIABETIC HISTORY:  [name] was diagnosed with DM in 2011,she is intolerant to metformin due to diarrhea. Her hemoglobin A1c has ranged from 7.3% in 2011, peaking at 9.5% in 2014.  SUBJECTIVE:   During the last visit (06/21/2020): A1c 7.9 %, adjusted insulin and started Ozempic   Today (09/24/2020): [name]  She checks her blood sugars multiple times a day  times daily, through CGM . The patient has not had hypoglycemic episodes since the last clinic visit.  She had fallen 3x in the past 6 weeks , currently having home PT     HOME DIABETES REGIMEN:  Humalog Mix 12 units with Breakfast and Supper - she continues to take per SS between 15- 21 units  Ozempic 0.5 mg weekly - has not started       Statin: yes ACE-I/ARB: yes    CONTINUOUS GLUCOSE MONITORING RECORD INTERPRETATION    Dates of Recording: 11/23-12/03/2020  Sensor description: [employer]   Results statistics:   CGM use % of time 99  Average and SD 159/28.3  Time in range    73    %  % Time Above 180 22  % Time above 250 5  % Time Below target 0     Glycemic patterns summary: optimal BG's overnight and hyperglycemia after lunch and supper   Hyperglycemic episodes  Post lunch and supper   Hypoglycemic episodes occurred N/A  Overnight periods: Optimal           DIABETIC COMPLICATIONS: Microvascular complications:   Neuropathy  Denies: CKD, , retinopathy  Last eye exam: Completed  03/2020  Macrovascular complications:    Denies: CAD, PVD, CVA    HISTORY:  Past Medical History:  Past Medical History:  Diagnosis Date  . Arthritis   . Back pain   . Chronic anticoagulation    due to aflutter  . Chronic kidney disease   . Diabetes mellitus   . Diastolic CHF, chronic (HCC)    a.  echo 2006 - ef 55-65%; mild diast dysfxn;    b. Echo 08/2011: Mild LVH, EF 60%;  c. 04/2013 Echo: EF 65-69%, mild conc LVH;  08/2014 Echo: EF 60-65%, mild-mod MR.  . Gout   . Hyperlipidemia   . Hypertension    a.  Renal arterial Dopplers 12/2011: 1-59% right renal artery stenosis  . Morbid obesity (HCC)   . Obstructive sleep apnea on CPAP   . Paroxysmal Afib/Flutter    a. 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   Past Surgical History:  Past Surgical History:  Procedure Laterality Date  . APPENDECTOMY    . ATRIAL FLUTTER ABLATION N/A 09/24/2011   Procedure: ATRIAL FLUTTER ABLATION;  Surgeon: Marinus Maw, MD;  Location: [facility] CATH LAB;  Service: Cardiovascular;  Laterality: N/A;  . CARDIOVERSION  10/22/2011   Procedure: CARDIOVERSION;  Surgeon: Duke Salvia, MD;  Location: [facility] OR;  Service: Cardiovascular;  Laterality: N/A;  . CARDIOVERSION N/A 09/10/2011   Procedure: CARDIOVERSION;  Surgeon: Duke Salvia, MD;  Location: [facility] CATH LAB;  Service: Cardiovascular;  Laterality: N/A;  . CHOLECYSTECTOMY    . TONSILLECTOMY  1982  . TOTAL ABDOMINAL HYSTERECTOMY      Social History:  reports that she has never smoked. She has never used smokeless tobacco. She reports that she does not drink alcohol and does not use drugs. Family History:  Family History  Problem Relation Age of Onset  . Heart disease Father   . Hypertension Father   . Breast cancer Sister   . Cancer Sister        breast     HOME MEDICATIONS: Allergies as of 09/24/2020      Reactions   Metformin Diarrhea      Medication List       Accurate as of September 24, 2020  2:23 PM. If you have any questions, ask your  nurse or doctor.        Accu-Chek Softclix Lancets lancets 1 each by Other route 3 (three) times daily. as directed   allopurinol 300 MG tablet Commonly known as: ZYLOPRIM Take 300 mg by mouth daily.   amiodarone 200 MG tablet Commonly known as: PACERONE Take 1 tablet (200 mg total) by mouth daily.   apixaban 5 MG Tabs tablet Commonly known as: Eliquis Take 1 tablet (5 mg total) by mouth 2 (two) times daily.   blood glucose meter kit and supplies Dispense based on patient and insurance preference. Use up to four times daily as directed. (FOR ICD-10 E10.9, E11.9).   cloNIDine 0.3 MG tablet Commonly known as: Catapres Take 1 tablet (0.3 mg total) by mouth 2 (two) times daily.   diltiazem 30 MG tablet Commonly known as: CARDIZEM Take 30 mg by mouth. Take one tablet every 4 hours as needed for heart rate greater than 100 and blood pressure needs to be above 100   FreeStyle Libre 14 Day Reader Hardie Pulley Use as directed.   FreeStyle Libre 14 Day Sensor Misc Use as directed to check blood sugar daily Started by: [name] Shorts, MD   HYDROcodone-acetaminophen 5-325 MG tablet Commonly known as: NORCO/VICODIN Take 1 tablet by mouth 2 (two) times daily as needed for moderate pain.   Insulin Lispro Prot & Lispro (75-25) 100 UNIT/ML Kwikpen Commonly known as: HUMALOG 75/25 MIX Inject 15-21 Units into the skin with breakfast, with lunch, and with evening meal. Sliding scale insulin   levothyroxine 75 MCG tablet Commonly known as: SYNTHROID Take 75 mcg daily before breakfast by mouth.   lidocaine 5 % Commonly known as: Lidoderm Place 1 patch onto the skin daily. Remove & Discard patch within 12 hours or as directed by MD   loperamide 2 MG capsule Commonly known as: IMODIUM Take 1 capsule (2 mg total) by mouth 4 (four) times daily as needed for diarrhea or loose stools.   losartan 100 MG tablet Commonly known as: COZAAR Take 1 tablet (100 mg total) by mouth daily.    NovoFine Plus 32G X 4 MM Misc Generic drug: Insulin Pen Needle 1 each by Subdermal route 2 (two) times daily. as directed   Ozempic (0.25 or 0.5 MG/DOSE) 2 MG/1.5ML Sopn Generic drug: Semaglutide(0.25 or 0.5MG /DOS) Inject 0.75 mLs (1 mg total) into the skin once a week.   potassium chloride 10 MEQ tablet Commonly known as: KLOR-CON Take 2 tablets (20 mEq total) by mouth daily.   Restasis MultiDose 0.05 % ophthalmic emulsion Generic drug: cycloSPORINE Place 1 drop into both eyes 2 (two) times daily.   rosuvastatin 10  MG tablet Commonly known as: CRESTOR Take 10 mg by mouth every evening.   tiZANidine 4 MG tablet Commonly known as: Zanaflex Take 1 tablet (4 mg total) by mouth every 8 (eight) hours as needed for muscle spasms.   torsemide 20 MG tablet Commonly known as: DEMADEX Take 2 tablets (40 mg total) by mouth daily.   traMADol 50 MG tablet Commonly known as: ULTRAM Take 2 tablets (100 mg total) by mouth every 6 (six) hours as needed.   triamcinolone 0.1 % Commonly known as: KENALOG Apply 1 application topically 2 (two) times daily.   verapamil 120 MG tablet Commonly known as: Calan Take 1 tablet (120 mg total) by mouth 2 (two) times daily.        OBJECTIVE:   Vital Signs: BP (!) 146/82   Pulse 62   Ht 5\' 1"  (1.549 m)   Wt 254 lb (115.2 kg)   SpO2 98%   BMI 47.99 kg/m   Wt Readings from Last 3 Encounters:  09/24/20 254 lb (115.2 kg)  09/11/20 255 lb (115.7 kg)  08/31/20 255 lb 3.2 oz (115.8 kg)     Exam: General: Pt appears well and is in NAD  Lungs: Clear with good BS bilat with no rales, rhonchi, or wheezes  Heart: RRR   Extremities: No pretibial edema.   Neuro: MS is good with appropriate affect, pt is alert and Ox3    DM foot exam: 9/2/221   The skin of the feet is intact without sores or ulcerations. The pedal pulses are 2+ on right and 2+ on left. The sensation is intact to a screening 5.07, 10 gram monofilament bilaterally          DATA REVIEWED:  Lab Results  Component Value Date   HGBA1C 7.7 (H) 09/12/2020   HGBA1C 7.9 (A) 03/28/2020   HGBA1C 7.8 (A) 02/28/2020   Lab Results  Component Value Date   MICROALBUR 6.3 (H) 06/21/2020   LDLCALC 57 09/12/2020   CREATININE 0.78 09/12/2020   Lab Results  Component Value Date   MICRALBCREAT 6.2 06/21/2020     Lab Results  Component Value Date   CHOL 135 09/12/2020   HDL 62 09/12/2020   LDLCALC 57 09/12/2020   TRIG 84 09/12/2020   CHOLHDL 2.2 09/12/2020         ASSESSMENT / PLAN / RECOMMENDATIONS:   1) Type 2 Diabetes Mellitus, Sub-Optimally controlled, With Neuropathic  complications - Most recent A1c of 7.7 %. Goal A1c < 7.0 %.    - A1c stable  - Unfortunately she continues to use insulin mix per sliding scale and up to 3 times a day. We again discussed high risk of hypoglycemia and not to use it per SS .  - She has not started Ozempic yet, I have encouraged her to restart  - Intolerant to Metformin    MEDICATIONS: - Humalog Mix 15 units with Breakfast and 15 units with Supper  - Restart Ozempic 0.25 mg weekly for 6 weeks, if no side effects please increase to 0.5 mg weekly    EDUCATION / INSTRUCTIONS:  BG monitoring instructions: Patient is instructed to check her blood sugars 2 times a day, before meals .  Call [facility] if: BG persistently < 70 . I reviewed the Rule of 15 for the treatment of hypoglycemia in detail with the patient. Literature supplied.    2) Diabetic complications:   Eye: Does nothave known diabetic retinopathy.   Neuro/ Feet: Does have known  diabetic peripheral neuropathy .   Renal: Patient does not have known baseline CKD. She   is on an ACEI/ARB at present.  3) Mixed Hyperlipidemia. : Patient is  On rosuvastatin 10 mg daily  , LDL at goal at 52 mg/dL but her Tg elevated. Will monitor.    F/U in 3 months    Signed electronically by: Lyndle Herrlich, MD  [facility] Endocrinology   [facility] [address] [place] [place], [place] [address] Phone: [phone] FAX: [phone]   CC: Georgina Quint, MD [address] [place] [address] Phone: [phone]  Fax: [phone]  Return to Endocrinology clinic as below: Future Appointments  Date Time Provider Department Center  09/28/2020 10:30 AM Freddie Breech, DPM [facility]  11/02/2020 10:45 AM Duke Salvia, MD [facility]  11/29/2020 10:00 AM Georgina Quint, MD [facility]  12/12/2020 10:20 AM Georgina Quint, MD PCP-PCP [facility]  12/26/2020  1:20 PM Jacier Gladu, Konrad Dolores, MD [facility] None

## 2020-09-25 DIAGNOSIS — I480 Paroxysmal atrial fibrillation: Secondary | ICD-10-CM | POA: Diagnosis not present

## 2020-09-25 DIAGNOSIS — E1165 Type 2 diabetes mellitus with hyperglycemia: Secondary | ICD-10-CM | POA: Diagnosis not present

## 2020-09-25 DIAGNOSIS — G8911 Acute pain due to trauma: Secondary | ICD-10-CM | POA: Diagnosis not present

## 2020-09-25 DIAGNOSIS — R296 Repeated falls: Secondary | ICD-10-CM | POA: Diagnosis not present

## 2020-09-25 DIAGNOSIS — I5032 Chronic diastolic (congestive) heart failure: Secondary | ICD-10-CM | POA: Diagnosis not present

## 2020-09-25 DIAGNOSIS — I130 Hypertensive heart and chronic kidney disease with heart failure and stage 1 through stage 4 chronic kidney disease, or unspecified chronic kidney disease: Secondary | ICD-10-CM | POA: Diagnosis not present

## 2020-09-25 DIAGNOSIS — M25511 Pain in right shoulder: Secondary | ICD-10-CM | POA: Diagnosis not present

## 2020-09-25 DIAGNOSIS — E1142 Type 2 diabetes mellitus with diabetic polyneuropathy: Secondary | ICD-10-CM | POA: Diagnosis not present

## 2020-09-25 DIAGNOSIS — E1122 Type 2 diabetes mellitus with diabetic chronic kidney disease: Secondary | ICD-10-CM | POA: Diagnosis not present

## 2020-09-27 DIAGNOSIS — G8911 Acute pain due to trauma: Secondary | ICD-10-CM | POA: Diagnosis not present

## 2020-09-27 DIAGNOSIS — I130 Hypertensive heart and chronic kidney disease with heart failure and stage 1 through stage 4 chronic kidney disease, or unspecified chronic kidney disease: Secondary | ICD-10-CM | POA: Diagnosis not present

## 2020-09-27 DIAGNOSIS — R296 Repeated falls: Secondary | ICD-10-CM | POA: Diagnosis not present

## 2020-09-27 DIAGNOSIS — I5032 Chronic diastolic (congestive) heart failure: Secondary | ICD-10-CM | POA: Diagnosis not present

## 2020-09-27 DIAGNOSIS — E1165 Type 2 diabetes mellitus with hyperglycemia: Secondary | ICD-10-CM | POA: Diagnosis not present

## 2020-09-27 DIAGNOSIS — I480 Paroxysmal atrial fibrillation: Secondary | ICD-10-CM | POA: Diagnosis not present

## 2020-09-27 DIAGNOSIS — E1122 Type 2 diabetes mellitus with diabetic chronic kidney disease: Secondary | ICD-10-CM | POA: Diagnosis not present

## 2020-09-27 DIAGNOSIS — M25511 Pain in right shoulder: Secondary | ICD-10-CM | POA: Diagnosis not present

## 2020-09-27 DIAGNOSIS — E1142 Type 2 diabetes mellitus with diabetic polyneuropathy: Secondary | ICD-10-CM | POA: Diagnosis not present

## 2020-09-28 ENCOUNTER — Ambulatory Visit (INDEPENDENT_AMBULATORY_CARE_PROVIDER_SITE_OTHER): Payer: Medicare Other | Admitting: Podiatry

## 2020-09-28 ENCOUNTER — Other Ambulatory Visit: Payer: Self-pay

## 2020-09-28 DIAGNOSIS — M79675 Pain in left toe(s): Secondary | ICD-10-CM

## 2020-09-28 DIAGNOSIS — B351 Tinea unguium: Secondary | ICD-10-CM

## 2020-09-28 DIAGNOSIS — M79674 Pain in right toe(s): Secondary | ICD-10-CM

## 2020-09-28 NOTE — Telephone Encounter (Signed)
Sent referrals. Is pt supposed to be taking amlodipine? I do not see it mentioned in recent visit nor med list. Please advise

## 2020-09-29 NOTE — Telephone Encounter (Signed)
No.  Not on her list of medications and not mentioned by cardiologist during recent visits.  Thanks.

## 2020-09-30 ENCOUNTER — Other Ambulatory Visit: Payer: Self-pay | Admitting: Family Medicine

## 2020-09-30 DIAGNOSIS — I152 Hypertension secondary to endocrine disorders: Secondary | ICD-10-CM

## 2020-09-30 DIAGNOSIS — E1159 Type 2 diabetes mellitus with other circulatory complications: Secondary | ICD-10-CM

## 2020-09-30 NOTE — Telephone Encounter (Signed)
Requested Prescriptions  Pending Prescriptions Disp Refills   cloNIDine (CATAPRES) 0.3 MG tablet [Pharmacy Med Name: CLONIDINE 0.3MG  TABLETS] 180 tablet 0    Sig: TAKE 1 TABLET(0.3 MG) BY MOUTH TWICE DAILY     Cardiovascular:  Alpha-2 Agonists Failed - 09/30/2020 11:15 AM      Failed - Last BP in normal range    BP Readings from Last 1 Encounters:  09/24/20 (!) 146/82         Passed - Last Heart Rate in normal range    Pulse Readings from Last 1 Encounters:  09/24/20 62         Passed - Valid encounter within last 6 months    Recent Outpatient Visits          2 weeks ago Hypertension associated with diabetes [facility])   Primary Care at [place], Ines Bloomer, MD   2 weeks ago Hypertension associated with diabetes [facility])   Primary Care at [facility], Ines Bloomer, MD   1 month ago Pain in joint of right shoulder   [facility] at [facility], Delfino Lovett, NP   1 month ago Tinnitus of right ear   Primary Care at [facility], [place], MD   2 months ago Rash and nonspecific skin eruption   Primary Care at [facility], Ines Bloomer, MD      Future Appointments            In 1 month [name], MD [place], [facility]   In 2 months [place], Ines Bloomer, MD Primary Care at [place], [place]   In 2 months [place], Ines Bloomer, MD Primary Care at [place], [facility]

## 2020-10-01 DIAGNOSIS — I5032 Chronic diastolic (congestive) heart failure: Secondary | ICD-10-CM | POA: Diagnosis not present

## 2020-10-01 DIAGNOSIS — R296 Repeated falls: Secondary | ICD-10-CM | POA: Diagnosis not present

## 2020-10-01 DIAGNOSIS — M25511 Pain in right shoulder: Secondary | ICD-10-CM | POA: Diagnosis not present

## 2020-10-01 DIAGNOSIS — E1142 Type 2 diabetes mellitus with diabetic polyneuropathy: Secondary | ICD-10-CM | POA: Diagnosis not present

## 2020-10-01 DIAGNOSIS — E1165 Type 2 diabetes mellitus with hyperglycemia: Secondary | ICD-10-CM | POA: Diagnosis not present

## 2020-10-01 DIAGNOSIS — G8911 Acute pain due to trauma: Secondary | ICD-10-CM | POA: Diagnosis not present

## 2020-10-01 DIAGNOSIS — I130 Hypertensive heart and chronic kidney disease with heart failure and stage 1 through stage 4 chronic kidney disease, or unspecified chronic kidney disease: Secondary | ICD-10-CM | POA: Diagnosis not present

## 2020-10-01 DIAGNOSIS — I480 Paroxysmal atrial fibrillation: Secondary | ICD-10-CM | POA: Diagnosis not present

## 2020-10-01 DIAGNOSIS — E1122 Type 2 diabetes mellitus with diabetic chronic kidney disease: Secondary | ICD-10-CM | POA: Diagnosis not present

## 2020-10-03 ENCOUNTER — Telehealth: Payer: Self-pay | Admitting: Emergency Medicine

## 2020-10-03 DIAGNOSIS — M25511 Pain in right shoulder: Secondary | ICD-10-CM | POA: Diagnosis not present

## 2020-10-03 DIAGNOSIS — E1142 Type 2 diabetes mellitus with diabetic polyneuropathy: Secondary | ICD-10-CM | POA: Diagnosis not present

## 2020-10-03 DIAGNOSIS — I130 Hypertensive heart and chronic kidney disease with heart failure and stage 1 through stage 4 chronic kidney disease, or unspecified chronic kidney disease: Secondary | ICD-10-CM | POA: Diagnosis not present

## 2020-10-03 DIAGNOSIS — E1165 Type 2 diabetes mellitus with hyperglycemia: Secondary | ICD-10-CM | POA: Diagnosis not present

## 2020-10-03 DIAGNOSIS — I480 Paroxysmal atrial fibrillation: Secondary | ICD-10-CM | POA: Diagnosis not present

## 2020-10-03 DIAGNOSIS — R296 Repeated falls: Secondary | ICD-10-CM | POA: Diagnosis not present

## 2020-10-03 DIAGNOSIS — G8911 Acute pain due to trauma: Secondary | ICD-10-CM | POA: Diagnosis not present

## 2020-10-03 DIAGNOSIS — I5032 Chronic diastolic (congestive) heart failure: Secondary | ICD-10-CM | POA: Diagnosis not present

## 2020-10-03 DIAGNOSIS — E1122 Type 2 diabetes mellitus with diabetic chronic kidney disease: Secondary | ICD-10-CM | POA: Diagnosis not present

## 2020-10-03 NOTE — Telephone Encounter (Signed)
Pt called and stated she was told by provider that he wanted her to stop taking one of her medications. Pt does not remember witch medication that was and would like a nurse to give her a call today. Please advise.

## 2020-10-03 NOTE — Telephone Encounter (Signed)
I have called pt back and she is not supposed to taking the Amlodipine. Pt stated understanding.

## 2020-10-03 NOTE — Telephone Encounter (Signed)
Verbal orders given on VM for Betsy the PT from [facility].

## 2020-10-03 NOTE — Telephone Encounter (Signed)
10/03/2020 - BETSY (PHYSICAL THERAPIST) FROM [facility] [place] DR. MIGUEL TO EXTEND HER P.T. THE FREQUENCY IS 1 TIME A WEEK FOR 1 WEEK - THEN 2 TIMES A WEEK FOR 2 WEEKS - THEN 1 TIME A WEEK FOR 1 WEEK. WOULD ALSO LIKE TO ADD AN OCCUPATIONAL THERAPY EVALUATION. PATIENT NEEDS A BARIATRIC ROLLATOR AND A BRIATRIC BEDSIDE COMODE. PLEASE FAX THE ORDER TO ANY MEDICAL SUPPLY COMPANY. BETSY'S CELL PHONE IS: [phone] IF QUESTIONS  MBC .

## 2020-10-03 NOTE — Telephone Encounter (Signed)
10/03/2020 - PATIENT ALSO WANTS TO KNOW IF SHE SHOULD STILL BE TAKING AMLODIPINE?

## 2020-10-04 DIAGNOSIS — L821 Other seborrheic keratosis: Secondary | ICD-10-CM | POA: Diagnosis not present

## 2020-10-04 DIAGNOSIS — L304 Erythema intertrigo: Secondary | ICD-10-CM | POA: Diagnosis not present

## 2020-10-04 DIAGNOSIS — D2230 Melanocytic nevi of unspecified part of face: Secondary | ICD-10-CM | POA: Diagnosis not present

## 2020-10-08 ENCOUNTER — Encounter: Payer: Self-pay | Admitting: Physician Assistant

## 2020-10-08 ENCOUNTER — Ambulatory Visit (HOSPITAL_COMMUNITY)
Admission: RE | Admit: 2020-10-08 | Discharge: 2020-10-08 | Disposition: A | Discharge status: Home / Self Care | Payer: Medicare Other | Source: Ambulatory Visit | Attending: Physician Assistant | Admitting: Physician Assistant

## 2020-10-08 ENCOUNTER — Ambulatory Visit (INDEPENDENT_AMBULATORY_CARE_PROVIDER_SITE_OTHER): Payer: Medicare Other | Admitting: Physician Assistant

## 2020-10-08 ENCOUNTER — Other Ambulatory Visit: Payer: Self-pay

## 2020-10-08 DIAGNOSIS — M25562 Pain in left knee: Secondary | ICD-10-CM

## 2020-10-08 DIAGNOSIS — M79662 Pain in left lower leg: Secondary | ICD-10-CM | POA: Diagnosis not present

## 2020-10-08 DIAGNOSIS — G8929 Other chronic pain: Secondary | ICD-10-CM

## 2020-10-08 NOTE — Progress Notes (Signed)
Office Visit Note   Patient: [name]           Date of Birth: 22-Sep-1941           MRN: [id number] Visit Date: 10/08/2020              Requested by: Horald Pollen, MD [place],  [place] [address] PCP: Horald Pollen, MD   Assessment & Plan: Visit Diagnoses:  1. Chronic pain of left knee   2. Pain of left calf     Plan: Due to patient's left calf tenderness and constant calf tenderness recommend ultrasound rule out DVT left lower extremity.  In regards to her left knee osteoarthritis would not recommend cortisone injection as the last cortisone injection in her shoulder caused her glucose levels to go up in the 270 range.  Therefore recommend supplemental injection in her knee we will try to gain approval for this and then have her back once this is available.  Questions encouraged and answered at length.  Follow-Up Instructions: Return for Supplemental injection.   Orders:  Orders Placed This Encounter  Procedures  . VAS [place] LOWER EXTREMITY VENOUS (DVT)   No orders of the defined types were placed in this encounter.     Procedures: No procedures performed   Clinical Data: No additional findings.   Subjective: Chief Complaint  Patient presents with  . Left Knee - Pain    HPI [name] comes in today for left knee pain.  She has known osteoarthritis left knee.  She is doing home health PT for her shoulder and her knee but feels that knee pain is becoming worse.  She is having calf pain and states that her pain in her knee and her calf is 11 out of 10 at worst.  Left knee gives away on her at times.  She is concerned about blood clot involving the left lower extremity.  She denies any swelling though.  She denies any fevers chills shortness of breath.  Review of Systems  Constitutional: Negative for chills and fever.  Respiratory: Negative for shortness of breath.   Cardiovascular: Negative for chest pain.     Objective: Vital  Signs: There were no vitals taken for this visit.  Physical Exam Constitutional:      Appearance: She is not ill-appearing or diaphoretic.  Pulmonary:     Effort: Pulmonary effort is normal.  Neurological:     Mental Status: She is alert and oriented to person, place, and time.  Psychiatric:        Mood and Affect: Mood normal.     Ortho Exam Left knee: Tenderness along medial joint line.  No abnormal warmth erythema.  Calf is tender to the touch but supple. Specialty Comments:  No specialty comments available.  Imaging: No results found.   PMFS History: Patient Active Problem List   Diagnosis Date Noted  . Persistent atrial fibrillation ([place]) 08/12/2020  . Type 2 diabetes mellitus with hyperglycemia, with long-term current use of insulin ([place]) 06/21/2020  . Type 2 diabetes mellitus with diabetic polyneuropathy, with long-term current use of insulin ([place]) 06/21/2020  . Mixed hyperlipidemia 06/21/2020  . Uncontrolled hypertension 06/11/2020  . Aortic atherosclerosis ([place]) 03/28/2020  . Diabetic peripheral neuropathy ([place]) 12/26/2019  . Gastroesophageal reflux disease without esophagitis 03/17/2019  . Epigastric pain 02/16/2019  . Bilateral leg edema 02/16/2019  . Dysgeusia 06/11/2017  . Acute pain of left knee 06/03/2017  . Allergic dermatitis 06/03/2017  . Obesity  12/19/2015  . Hypersomnia 12/19/2015  . Edema 10/18/2015  . Acquired hypothyroidism 10/18/2015  . Acute diastolic congestive heart failure, NYHA class 3 ([place]) 10/03/2013  . Prolonged QT interval 10/03/2013  . Acute diastolic heart failure ([place]) 10/03/2013  . Long term (current) use of anticoagulants 06/09/2012  . BENIGN NEOPLASM OF ADRENAL GLAND 11/25/2010  . Chronic diastolic heart failure ([place]) 02/20/2010  . DM 05/16/2009  . GOUT 05/16/2009  . OBESITY, MORBID 05/16/2009  . Hypertension associated with diabetes ([place]) 05/16/2009  . Paroxysmal atrial fibrillation ([place]) 05/16/2009  . HYPERLIPIDEMIA  11/30/2008  . Obstructive sleep apnea on CPAP 11/30/2008   Past Medical History:  Diagnosis Date  . Arthritis   . Back pain   . Chronic anticoagulation    due to aflutter  . Chronic kidney disease   . Diabetes mellitus   . Diastolic CHF, chronic ([place])    a.  echo 2006 - ef 55-65%; mild diast dysfxn;    b. Echo 08/2011: Mild LVH, EF 60%;  c. 04/2013 Echo: EF 65-69%, mild conc LVH;  08/2014 Echo: EF 60-65%, mild-mod MR.  . Gout   . Hyperlipidemia   . Hypertension    a.  Renal arterial Dopplers 12/2011: 1-59% right renal artery stenosis  . Morbid obesity ([place])   . Obstructive sleep apnea on CPAP   . Paroxysmal Afib/Flutter    a. 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 Heart disease Father   . Hypertension Father   . Breast cancer Sister   . Cancer Sister        breast    Past Surgical History:  Procedure Laterality Date  . APPENDECTOMY    . ATRIAL FLUTTER ABLATION N/A 09/24/2011   Procedure: ATRIAL FLUTTER ABLATION;  Surgeon: [name], MD;  Location: [facility] CATH LAB;  Service: Cardiovascular;  Laterality: N/A;  . CARDIOVERSION  10/22/2011   Procedure: CARDIOVERSION;  Surgeon: [name], MD;  Location: [place];  Service: Cardiovascular;  Laterality: N/A;  . CARDIOVERSION N/A 09/10/2011   Procedure: CARDIOVERSION;  Surgeon: [name], MD;  Location: [facility] CATH LAB;  Service: Cardiovascular;  Laterality: N/A;  . CHOLECYSTECTOMY    . TONSILLECTOMY  1982  . TOTAL ABDOMINAL HYSTERECTOMY     Social History   Occupational History  . Occupation: DISABILITY/housewife    Employer: RETIRED  Tobacco Use  . Smoking status: Never Smoker  . Smokeless tobacco: Never Used  Vaping Use  . Vaping Use: Never used  Substance and Sexual Activity  . Alcohol use: No  . Drug use: No  . Sexual activity: Yes

## 2020-10-09 ENCOUNTER — Telehealth: Payer: Self-pay | Admitting: Emergency Medicine

## 2020-10-09 ENCOUNTER — Telehealth: Payer: Self-pay | Admitting: Physician Assistant

## 2020-10-09 ENCOUNTER — Telehealth: Payer: Self-pay | Admitting: *Deleted

## 2020-10-09 DIAGNOSIS — E1122 Type 2 diabetes mellitus with diabetic chronic kidney disease: Secondary | ICD-10-CM | POA: Diagnosis not present

## 2020-10-09 DIAGNOSIS — I480 Paroxysmal atrial fibrillation: Secondary | ICD-10-CM | POA: Diagnosis not present

## 2020-10-09 DIAGNOSIS — E1142 Type 2 diabetes mellitus with diabetic polyneuropathy: Secondary | ICD-10-CM | POA: Diagnosis not present

## 2020-10-09 DIAGNOSIS — I130 Hypertensive heart and chronic kidney disease with heart failure and stage 1 through stage 4 chronic kidney disease, or unspecified chronic kidney disease: Secondary | ICD-10-CM | POA: Diagnosis not present

## 2020-10-09 DIAGNOSIS — R296 Repeated falls: Secondary | ICD-10-CM | POA: Diagnosis not present

## 2020-10-09 DIAGNOSIS — I5032 Chronic diastolic (congestive) heart failure: Secondary | ICD-10-CM | POA: Diagnosis not present

## 2020-10-09 DIAGNOSIS — M25511 Pain in right shoulder: Secondary | ICD-10-CM | POA: Diagnosis not present

## 2020-10-09 DIAGNOSIS — E1165 Type 2 diabetes mellitus with hyperglycemia: Secondary | ICD-10-CM | POA: Diagnosis not present

## 2020-10-09 DIAGNOSIS — G8911 Acute pain due to trauma: Secondary | ICD-10-CM | POA: Diagnosis not present

## 2020-10-09 NOTE — Telephone Encounter (Signed)
Yes get the knee moving.

## 2020-10-09 NOTE — Telephone Encounter (Signed)
Faxed signed order dated (10/03/2020) to [facility]. Confirmation page 5:18 pm.

## 2020-10-09 NOTE — Telephone Encounter (Signed)
LMOM for PT with the below message from [place]

## 2020-10-09 NOTE — Telephone Encounter (Signed)
Pt therapist called and was wondering since her Doppler Ultrasound came back negative can she start doing things with that left leg? His number is [phone]

## 2020-10-09 NOTE — Telephone Encounter (Signed)
Will from [facility] is wanting verbal orders for OT for pt  once a week for 1 week, 2 times a week for 1 week and 1 time for 1 week. Please advise Will at [phone].

## 2020-10-10 NOTE — Telephone Encounter (Signed)
Called Will Ok'd requested orders. Will begin immediately

## 2020-10-11 ENCOUNTER — Telehealth: Payer: Self-pay | Admitting: Emergency Medicine

## 2020-10-11 NOTE — Telephone Encounter (Signed)
Paperwork from [place] needing Providrs signature came across xax. Placed paperwork in Provider mail bos in Provider lounge

## 2020-10-11 NOTE — Telephone Encounter (Signed)
So you are aware of this paperwork. Thank you

## 2020-10-11 NOTE — Telephone Encounter (Signed)
Paperwork was done this morning.  Thanks.

## 2020-10-15 ENCOUNTER — Telehealth: Payer: Self-pay | Admitting: *Deleted

## 2020-10-15 DIAGNOSIS — I130 Hypertensive heart and chronic kidney disease with heart failure and stage 1 through stage 4 chronic kidney disease, or unspecified chronic kidney disease: Secondary | ICD-10-CM | POA: Diagnosis not present

## 2020-10-15 DIAGNOSIS — R296 Repeated falls: Secondary | ICD-10-CM | POA: Diagnosis not present

## 2020-10-15 DIAGNOSIS — M25511 Pain in right shoulder: Secondary | ICD-10-CM | POA: Diagnosis not present

## 2020-10-15 DIAGNOSIS — G8911 Acute pain due to trauma: Secondary | ICD-10-CM | POA: Diagnosis not present

## 2020-10-15 DIAGNOSIS — I480 Paroxysmal atrial fibrillation: Secondary | ICD-10-CM | POA: Diagnosis not present

## 2020-10-15 DIAGNOSIS — E1165 Type 2 diabetes mellitus with hyperglycemia: Secondary | ICD-10-CM | POA: Diagnosis not present

## 2020-10-15 DIAGNOSIS — E1142 Type 2 diabetes mellitus with diabetic polyneuropathy: Secondary | ICD-10-CM | POA: Diagnosis not present

## 2020-10-15 DIAGNOSIS — I5032 Chronic diastolic (congestive) heart failure: Secondary | ICD-10-CM | POA: Diagnosis not present

## 2020-10-15 DIAGNOSIS — E1122 Type 2 diabetes mellitus with diabetic chronic kidney disease: Secondary | ICD-10-CM | POA: Diagnosis not present

## 2020-10-15 NOTE — Telephone Encounter (Signed)
On 10/11/2020, faxed signed orders to [facility]. Confirmation at 4:36 pm.

## 2020-10-17 ENCOUNTER — Other Ambulatory Visit: Payer: Self-pay

## 2020-10-17 ENCOUNTER — Telehealth (INDEPENDENT_AMBULATORY_CARE_PROVIDER_SITE_OTHER): Payer: Medicare Other | Admitting: Emergency Medicine

## 2020-10-17 ENCOUNTER — Encounter: Payer: Self-pay | Admitting: Emergency Medicine

## 2020-10-17 VITALS — BP 156/79 | Ht 61.0 in | Wt 251.0 lb

## 2020-10-17 DIAGNOSIS — R059 Cough, unspecified: Secondary | ICD-10-CM | POA: Diagnosis not present

## 2020-10-17 DIAGNOSIS — R6889 Other general symptoms and signs: Secondary | ICD-10-CM | POA: Diagnosis not present

## 2020-10-17 DIAGNOSIS — Z20822 Contact with and (suspected) exposure to covid-19: Secondary | ICD-10-CM | POA: Diagnosis not present

## 2020-10-17 MED ORDER — HYDROCODONE-HOMATROPINE 5-1.5 MG/5ML PO SYRP: 5.0000 mL | ORAL_SOLUTION | Freq: Every evening | ORAL | 0 refills | Status: DC | PRN

## 2020-10-17 NOTE — Progress Notes (Signed)
Telemedicine Encounter- SOAP NOTE Established Patient Patient: Home  Provider: Office     This telephone encounter was conducted with the patient's (or proxy's) verbal consent via audio telecommunications: yes/no: Yes Patient was instructed to have this encounter in a suitably private space; and to only have persons present to whom they give permission to participate. In addition, patient identity was confirmed by use of name plus two identifiers (DOB and address).  I discussed the limitations, risks, security and privacy concerns of performing an evaluation and management service by telephone and the availability of in person appointments. I also discussed with the patient that there may be a patient responsible charge related to this service. The patient expressed understanding and agreed to proceed.  I spent a total of TIME; 0 MIN TO 60 MIN: 20 minutes talking with the patient or their proxy.  Chief Complaint  Patient presents with  . Cough    Per patient non productive Sxs started 3 days ago and nasal congestion  . Sore Throat    Per patient bad and body aches  . Fever    Temp 101.0 degrees yesterday with chills and patient is getting tested for Covid 19 today at the [place] is a 79 y.o. female established patient. Telephone visit today complaining of flulike symptoms that started 3 days ago.  Mostly complaining of cough but also has headache and generalized achiness with low-grade fever.  Also has sore throat and nasal congestion.  Denies difficulty breathing.  Denies chest pain or palpitations.  Able to eat and drink.  Denies nausea or vomiting.  Denies abdominal pain or diarrhea.  Getting tested for Covid today. No other complaints or medical concerns.  HPI   Patient Active Problem List   Diagnosis Date Noted  . Persistent atrial fibrillation ([place]) 08/12/2020  . Type 2 diabetes mellitus with hyperglycemia, with long-term current use of  insulin ([place]) 06/21/2020  . Type 2 diabetes mellitus with diabetic polyneuropathy, with long-term current use of insulin ([place]) 06/21/2020  . Mixed hyperlipidemia 06/21/2020  . Uncontrolled hypertension 06/11/2020  . Aortic atherosclerosis ([place]) 03/28/2020  . Diabetic peripheral neuropathy ([place]) 12/26/2019  . Gastroesophageal reflux disease without esophagitis 03/17/2019  . Epigastric pain 02/16/2019  . Bilateral leg edema 02/16/2019  . Dysgeusia 06/11/2017  . Acute pain of left knee 06/03/2017  . Allergic dermatitis 06/03/2017  . Obesity 12/19/2015  . Hypersomnia 12/19/2015  . Edema 10/18/2015  . Acquired hypothyroidism 10/18/2015  . Acute diastolic congestive heart failure, NYHA class 3 ([place]) 10/03/2013  . Prolonged QT interval 10/03/2013  . Acute diastolic heart failure ([place]) 10/03/2013  . Long term (current) use of anticoagulants 06/09/2012  . BENIGN NEOPLASM OF ADRENAL GLAND 11/25/2010  . Chronic diastolic heart failure ([place]) 02/20/2010  . DM 05/16/2009  . GOUT 05/16/2009  . OBESITY, MORBID 05/16/2009  . Hypertension associated with diabetes ([place]) 05/16/2009  . Paroxysmal atrial fibrillation ([place]) 05/16/2009  . HYPERLIPIDEMIA 11/30/2008  . Obstructive sleep apnea on CPAP 11/30/2008    Past Medical History:  Diagnosis Date  . Arthritis   . Back pain   . Chronic anticoagulation    due to aflutter  . Chronic kidney disease   . Diabetes mellitus   . Diastolic CHF, chronic ([place])    a.  echo 2006 - ef 55-65%; mild diast dysfxn;    b. Echo 08/2011: Mild LVH, EF 60%;  c. 04/2013 Echo: EF 65-69%, mild conc LVH;  08/2014  Echo: EF 60-65%, mild-mod MR.  . Gout   . Hyperlipidemia   . Hypertension    a.  Renal arterial Dopplers 12/2011: 1-59% right renal artery stenosis  . Morbid obesity ([place])   . Obstructive sleep apnea on CPAP   . Paroxysmal Afib/Flutter    a. 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    Current Outpatient Medications  Medication Sig Dispense Refill  .  allopurinol (ZYLOPRIM) 300 MG tablet Take 300 mg by mouth daily.     Marland Kitchen amiodarone (PACERONE) 200 MG tablet Take 1 tablet (200 mg total) by mouth daily. 90 tablet 3  . apixaban (ELIQUIS) 5 MG TABS tablet Take 1 tablet (5 mg total) by mouth 2 (two) times daily. 180 tablet 1  . cloNIDine (CATAPRES) 0.3 MG tablet TAKE 1 TABLET(0.3 MG) BY MOUTH TWICE DAILY 180 tablet 0  . cyclobenzaprine (FLEXERIL) 5 MG tablet     . HYDROcodone-acetaminophen (NORCO/VICODIN) 5-325 MG tablet Take 1 tablet by mouth 2 (two) times daily as needed for moderate pain. 30 tablet 0  . Insulin Lispro Prot & Lispro (HUMALOG 75/25 MIX) (75-25) 100 UNIT/ML Kwikpen Inject 15-21 Units into the skin with breakfast, with lunch, and with evening meal. Sliding scale insulin    . levothyroxine (SYNTHROID, LEVOTHROID) 75 MCG tablet Take 75 mcg daily before breakfast by mouth.     . loperamide (IMODIUM) 2 MG capsule Take 1 capsule (2 mg total) by mouth 4 (four) times daily as needed for diarrhea or loose stools. 12 capsule 0  . losartan (COZAAR) 100 MG tablet Take 1 tablet (100 mg total) by mouth daily. 90 tablet 3  . oxyCODONE (OXY IR/ROXICODONE) 5 MG immediate release tablet     . potassium chloride (KLOR-CON) 10 MEQ tablet Take 2 tablets (20 mEq total) by mouth daily. 180 tablet 3  . RESTASIS MULTIDOSE 0.05 % ophthalmic emulsion Place 1 drop into both eyes 2 (two) times daily.     . rosuvastatin (CRESTOR) 10 MG tablet Take 10 mg by mouth every evening.    . Semaglutide,0.25 or 0.5MG/DOS, (OZEMPIC, 0.25 OR 0.5 MG/DOSE,) 2 MG/1.5ML SOPN Inject 0.75 mLs (1 mg total) into the skin once a week. 12 pen 3  . tiZANidine (ZANAFLEX) 4 MG tablet Take 1 tablet (4 mg total) by mouth every 8 (eight) hours as needed for muscle spasms. 60 tablet 1  . torsemide (DEMADEX) 20 MG tablet Take 2 tablets (40 mg total) by mouth daily. 180 tablet 3  . triamcinolone cream (KENALOG) 0.1 % Apply 1 application topically 2 (two) times daily. 30 g 0  . verapamil (CALAN)  120 MG tablet Take 1 tablet (120 mg total) by mouth 2 (two) times daily. 180 tablet 3  . Accu-Chek Softclix Lancets lancets 1 each by Other route 3 (three) times daily. as directed 100 each 3  . blood glucose meter kit and supplies Dispense based on patient and insurance preference. Use up to four times daily as directed. (FOR ICD-10 E10.9, E11.9). 1 each 0  . Continuous Blood Gluc Receiver (FREESTYLE LIBRE 14 DAY READER) DEVI Use as directed. 1 each 0  . Continuous Blood Gluc Sensor (FREESTYLE LIBRE 14 DAY SENSOR) MISC Use as directed to check blood sugar daily 2 each 3  . diltiazem (CARDIZEM) 30 MG tablet Take 30 mg by mouth. Take one tablet every 4 hours as needed for heart rate greater than 100 and blood pressure needs to be above 100    . lidocaine (LIDODERM) 5 % Place 1 patch  onto the skin daily. Remove & Discard patch within 12 hours or as directed by MD 30 patch 0  . NOVOFINE PLUS 32G X 4 MM MISC 1 each by Subdermal route 2 (two) times daily. as directed 60 each 3  . ONETOUCH VERIO test strip 1 each 3 (three) times daily.     No current facility-administered medications for this visit.    Allergies  Allergen Reactions  . Metformin Diarrhea    Social History   Socioeconomic History  . Marital status: Married    Spouse name: Not on file  . Number of children: 3  . Years of education: Not on file  . Highest education level: Not on file  Occupational History  . Occupation: DISABILITY/housewife    Employer: RETIRED  Tobacco Use  . Smoking status: Never Smoker  . Smokeless tobacco: Never Used  Vaping Use  . Vaping Use: Never used  Substance and Sexual Activity  . Alcohol use: No  . Drug use: No  . Sexual activity: Yes  Other Topics Concern  . Not on file  Social History Narrative  . Not on file   Social Determinants of Health   Financial Resource Strain: Not on file  Food Insecurity: Not on file  Transportation Needs: Not on file  Physical Activity: Not on file   Stress: Not on file  Social Connections: Not on file  Intimate Partner Violence: Not on file    Review of Systems  Constitutional: Positive for fever.  HENT: Positive for congestion and sore throat.   Respiratory: Positive for cough. Negative for shortness of breath.   Cardiovascular: Negative for chest pain and palpitations.  Gastrointestinal: Negative for abdominal pain, diarrhea, nausea and vomiting.  Genitourinary: Negative.  Negative for dysuria and hematuria.  Skin: Negative.  Negative for rash.  Neurological: Negative.  Negative for dizziness and headaches.  All other systems reviewed and are negative.   Objective  Alert and oriented x3 in no apparent respiratory distress. Vitals as reported by the patient: Today's Vitals   10/17/20 1557  BP: (!) 156/79  Weight: 251 lb (113.9 kg)  Height: 5' 1" (1.549 m)    There are no diagnoses linked to this encounter. Miyah was seen today for cough, sore throat and fever.  Diagnoses and all orders for this visit:  Cough -     HYDROcodone-homatropine (HYCODAN) 5-1.5 MG/5ML syrup; Take 5 mLs by mouth at bedtime as needed for cough.  Flu-like symptoms  Suspected COVID-19 virus infection  Clinically stable.  No red flag signs or symptoms. Medications as prescribed.  Covid advice given. ED precautions given. Advised to contact the office if no better or worse in the next several days.   I discussed the assessment and treatment plan with the patient. The patient was provided an opportunity to ask questions and all were answered. The patient agreed with the plan and demonstrated an understanding of the instructions.   The patient was advised to call back or seek an in-person evaluation if the symptoms worsen or if the condition fails to improve as anticipated.  I provided 20 minutes of non-face-to-face time during this encounter.  Horald Pollen, MD  Primary Care at [facility]

## 2020-10-17 NOTE — Patient Instructions (Signed)
° ° ° °  If you have lab work done today you will be contacted with your lab results within the next 2 weeks.  If you have not heard from us then please contact us. The fastest way to get your results is to register for My Chart. ° ° °IF you received an x-ray today, you will receive an invoice from [facility]. Please contact [facility] at [phone] with questions or concerns regarding your invoice.  ° °IF you received labwork today, you will receive an invoice from [facility]. Please contact [facility] at [phone] with questions or concerns regarding your invoice.  ° °Our billing staff will not be able to assist you with questions regarding bills from these companies. ° °You will be contacted with the lab results as soon as they are available. The fastest way to get your results is to activate your My Chart account. Instructions are located on the last page of this paperwork. If you have not heard from us regarding the results in 2 weeks, please contact this office. °  ° ° ° °

## 2020-10-18 DIAGNOSIS — G8911 Acute pain due to trauma: Secondary | ICD-10-CM | POA: Diagnosis not present

## 2020-10-18 DIAGNOSIS — I5032 Chronic diastolic (congestive) heart failure: Secondary | ICD-10-CM | POA: Diagnosis not present

## 2020-10-18 DIAGNOSIS — I480 Paroxysmal atrial fibrillation: Secondary | ICD-10-CM | POA: Diagnosis not present

## 2020-10-18 DIAGNOSIS — E1165 Type 2 diabetes mellitus with hyperglycemia: Secondary | ICD-10-CM | POA: Diagnosis not present

## 2020-10-18 DIAGNOSIS — R296 Repeated falls: Secondary | ICD-10-CM | POA: Diagnosis not present

## 2020-10-18 DIAGNOSIS — M25511 Pain in right shoulder: Secondary | ICD-10-CM | POA: Diagnosis not present

## 2020-10-18 DIAGNOSIS — I130 Hypertensive heart and chronic kidney disease with heart failure and stage 1 through stage 4 chronic kidney disease, or unspecified chronic kidney disease: Secondary | ICD-10-CM | POA: Diagnosis not present

## 2020-10-18 DIAGNOSIS — E1142 Type 2 diabetes mellitus with diabetic polyneuropathy: Secondary | ICD-10-CM | POA: Diagnosis not present

## 2020-10-18 DIAGNOSIS — E1122 Type 2 diabetes mellitus with diabetic chronic kidney disease: Secondary | ICD-10-CM | POA: Diagnosis not present

## 2020-10-22 ENCOUNTER — Telehealth: Payer: Self-pay

## 2020-10-22 NOTE — Telephone Encounter (Signed)
Submitted for VOB for Durolane-Left knee  

## 2020-10-23 ENCOUNTER — Telehealth: Payer: Self-pay

## 2020-10-23 DIAGNOSIS — I480 Paroxysmal atrial fibrillation: Secondary | ICD-10-CM | POA: Diagnosis not present

## 2020-10-23 DIAGNOSIS — M25511 Pain in right shoulder: Secondary | ICD-10-CM | POA: Diagnosis not present

## 2020-10-23 DIAGNOSIS — I5032 Chronic diastolic (congestive) heart failure: Secondary | ICD-10-CM | POA: Diagnosis not present

## 2020-10-23 DIAGNOSIS — E1142 Type 2 diabetes mellitus with diabetic polyneuropathy: Secondary | ICD-10-CM | POA: Diagnosis not present

## 2020-10-23 DIAGNOSIS — R296 Repeated falls: Secondary | ICD-10-CM | POA: Diagnosis not present

## 2020-10-23 DIAGNOSIS — N189 Chronic kidney disease, unspecified: Secondary | ICD-10-CM | POA: Diagnosis not present

## 2020-10-23 DIAGNOSIS — G8911 Acute pain due to trauma: Secondary | ICD-10-CM | POA: Diagnosis not present

## 2020-10-23 DIAGNOSIS — I130 Hypertensive heart and chronic kidney disease with heart failure and stage 1 through stage 4 chronic kidney disease, or unspecified chronic kidney disease: Secondary | ICD-10-CM | POA: Diagnosis not present

## 2020-10-23 DIAGNOSIS — E1122 Type 2 diabetes mellitus with diabetic chronic kidney disease: Secondary | ICD-10-CM | POA: Diagnosis not present

## 2020-10-23 NOTE — Telephone Encounter (Signed)
Lvm for pt to call back to schedule   Ok to schedule @ next available  

## 2020-10-23 NOTE — Telephone Encounter (Signed)
Approved for Durolane-left knee Dr. Kathrin Greathouse and Bill $30 copay 20% OOP No prior auth required

## 2020-10-24 ENCOUNTER — Telehealth: Payer: Self-pay | Admitting: *Deleted

## 2020-10-24 DIAGNOSIS — M25511 Pain in right shoulder: Secondary | ICD-10-CM | POA: Diagnosis not present

## 2020-10-24 DIAGNOSIS — R296 Repeated falls: Secondary | ICD-10-CM | POA: Diagnosis not present

## 2020-10-24 DIAGNOSIS — I130 Hypertensive heart and chronic kidney disease with heart failure and stage 1 through stage 4 chronic kidney disease, or unspecified chronic kidney disease: Secondary | ICD-10-CM | POA: Diagnosis not present

## 2020-10-24 DIAGNOSIS — G8911 Acute pain due to trauma: Secondary | ICD-10-CM | POA: Diagnosis not present

## 2020-10-24 DIAGNOSIS — E1142 Type 2 diabetes mellitus with diabetic polyneuropathy: Secondary | ICD-10-CM | POA: Diagnosis not present

## 2020-10-24 DIAGNOSIS — I5032 Chronic diastolic (congestive) heart failure: Secondary | ICD-10-CM | POA: Diagnosis not present

## 2020-10-24 DIAGNOSIS — E1122 Type 2 diabetes mellitus with diabetic chronic kidney disease: Secondary | ICD-10-CM | POA: Diagnosis not present

## 2020-10-24 DIAGNOSIS — N189 Chronic kidney disease, unspecified: Secondary | ICD-10-CM | POA: Diagnosis not present

## 2020-10-24 DIAGNOSIS — I480 Paroxysmal atrial fibrillation: Secondary | ICD-10-CM | POA: Diagnosis not present

## 2020-10-24 DIAGNOSIS — Z794 Long term (current) use of insulin: Secondary | ICD-10-CM | POA: Diagnosis not present

## 2020-10-24 NOTE — Telephone Encounter (Signed)
10/24/2020 - [name] - PATIENT CALLED TO SAY SHE NEEDS TO RESCHEDULE HER AWV. SHE HAS ANOTHER APPOINTMENT ON 10/29/20 AT THE SAME TIME. BEST PHONE [phone] (CELL) MBC

## 2020-10-25 DIAGNOSIS — N189 Chronic kidney disease, unspecified: Secondary | ICD-10-CM | POA: Diagnosis not present

## 2020-10-25 DIAGNOSIS — E1142 Type 2 diabetes mellitus with diabetic polyneuropathy: Secondary | ICD-10-CM | POA: Diagnosis not present

## 2020-10-25 DIAGNOSIS — E1122 Type 2 diabetes mellitus with diabetic chronic kidney disease: Secondary | ICD-10-CM | POA: Diagnosis not present

## 2020-10-25 DIAGNOSIS — G8911 Acute pain due to trauma: Secondary | ICD-10-CM | POA: Diagnosis not present

## 2020-10-25 DIAGNOSIS — I130 Hypertensive heart and chronic kidney disease with heart failure and stage 1 through stage 4 chronic kidney disease, or unspecified chronic kidney disease: Secondary | ICD-10-CM | POA: Diagnosis not present

## 2020-10-25 DIAGNOSIS — R296 Repeated falls: Secondary | ICD-10-CM | POA: Diagnosis not present

## 2020-10-25 DIAGNOSIS — M25511 Pain in right shoulder: Secondary | ICD-10-CM | POA: Diagnosis not present

## 2020-10-25 DIAGNOSIS — I5032 Chronic diastolic (congestive) heart failure: Secondary | ICD-10-CM | POA: Diagnosis not present

## 2020-10-25 DIAGNOSIS — I480 Paroxysmal atrial fibrillation: Secondary | ICD-10-CM | POA: Diagnosis not present

## 2020-10-29 ENCOUNTER — Ambulatory Visit (INDEPENDENT_AMBULATORY_CARE_PROVIDER_SITE_OTHER): Payer: Medicare Other | Admitting: Emergency Medicine

## 2020-10-29 ENCOUNTER — Telehealth: Payer: Self-pay | Admitting: *Deleted

## 2020-10-29 ENCOUNTER — Other Ambulatory Visit: Payer: Self-pay | Admitting: *Deleted

## 2020-10-29 VITALS — BP 146/82 | Ht 61.0 in | Wt 254.0 lb

## 2020-10-29 DIAGNOSIS — E1142 Type 2 diabetes mellitus with diabetic polyneuropathy: Secondary | ICD-10-CM | POA: Diagnosis not present

## 2020-10-29 DIAGNOSIS — I5032 Chronic diastolic (congestive) heart failure: Secondary | ICD-10-CM | POA: Diagnosis not present

## 2020-10-29 DIAGNOSIS — I130 Hypertensive heart and chronic kidney disease with heart failure and stage 1 through stage 4 chronic kidney disease, or unspecified chronic kidney disease: Secondary | ICD-10-CM | POA: Diagnosis not present

## 2020-10-29 DIAGNOSIS — Z0000 Encounter for general adult medical examination without abnormal findings: Secondary | ICD-10-CM | POA: Diagnosis not present

## 2020-10-29 DIAGNOSIS — M25511 Pain in right shoulder: Secondary | ICD-10-CM | POA: Diagnosis not present

## 2020-10-29 DIAGNOSIS — R296 Repeated falls: Secondary | ICD-10-CM | POA: Diagnosis not present

## 2020-10-29 DIAGNOSIS — I480 Paroxysmal atrial fibrillation: Secondary | ICD-10-CM | POA: Diagnosis not present

## 2020-10-29 DIAGNOSIS — N189 Chronic kidney disease, unspecified: Secondary | ICD-10-CM | POA: Diagnosis not present

## 2020-10-29 DIAGNOSIS — E1122 Type 2 diabetes mellitus with diabetic chronic kidney disease: Secondary | ICD-10-CM | POA: Diagnosis not present

## 2020-10-29 DIAGNOSIS — G8911 Acute pain due to trauma: Secondary | ICD-10-CM | POA: Diagnosis not present

## 2020-10-29 NOTE — Patient Instructions (Signed)
Thank you for taking time to come for your Medicare Wellness Visit. I appreciate your ongoing commitment to your health goals. Please review the following plan we discussed and let me know if I can assist you in the future.  Leroy Kennedy LPN   Understanding Your Risk for Falls Each year, millions of people have serious injuries from falls. It is important to understand your risk for falling. Talk with your health care provider about your risk and what you can do to lower it. There are actions you can take at home to lower your risk and prevent falls. If you do have a serious fall, make sure to tell your health care provider. Falling once raises your risk of falling again. How can falls affect me? Serious injuries from falls are common. These include:  Broken bones, such as hip fractures.  Head injuries, such as traumatic brain injuries (TBI) or concussion. A fear of falling can cause you to avoid activities and stay at home. This can make your muscles weaker and actually raise your risk for a fall. What can increase my risk? There are a number of risk factors that increase your risk for falling. The more risk factors you have, the higher your risk of falling. Serious injuries from a fall happen most often to people older than age 19. Children and young adults ages 70-29 are also at higher risk. Common risk factors include:  Weakness in the lower body.  Lack (deficiency) of vitamin D.  Being generally weak or confused due to long-term (chronic) illness.  Dizziness or balance problems.  Poor vision.  Medicines that cause dizziness or drowsiness. These can include medicines for your blood pressure, heart, anxiety, insomnia, or edema, as well as pain medicines and muscle relaxants. Other risk factors include:  Drinking alcohol.  Having had a fall in the past.  Having depression.  Having foot pain or wearing improper footwear.  Working at a dangerous job.  Having any of the  following in your home: ? Tripping hazards, such as floor clutter or loose rugs. ? Poor lighting. ? Pets.  Having dementia or memory loss. What actions can I take to lower my risk of falling? Physical activity Maintain physical fitness. Do strength and balance exercises. Consider taking a regular class to build strength and balance. Yoga and tai chi are good options. Vision Have your eyes checked every year and your vision prescription updated as needed. Walking aids and footwear  Wear nonskid shoes. Do not wear high heels.  Do not walk around the house in socks or slippers.  Use a cane or walker as told by your health care provider. Home safety  Attach secure railings on both sides of your stairs.  Install grab bars for your tub, shower, and toilet. Use a bath mat in your tub or shower.  Use good lighting in all rooms. Keep a flashlight near your bed.  Make sure there is a clear path from your bed to the bathroom. Use night-lights.  Do not use throw rugs. Make sure all carpeting is taped or tacked down securely.  Remove all clutter from walkways and stairways, including extension cords.  Repair uneven or broken steps.  Avoid walking on icy or slippery surfaces. Walk on the grass instead of on icy or slick sidewalks. Use ice melt to get rid of ice on walkways.  Use a cordless phone.      Questions to ask your health care provider  Can you help me check my  risk for a fall?  Do any of my medicines make me more likely to fall?  Should I take a vitamin D supplement?  What exercises can I do to improve my strength and balance?  Should I make an appointment to have my vision checked?  Do I need a bone density test to check for weak bones or osteoporosis?  Would it help to use a cane or a walker? Where to find more information  Centers for Disease Control and Prevention, STEADI: http://www.wolf.info/  Community-Based Fall Prevention Programs: http://www.wolf.info/  National Institute  on Aging: http://kim-miller.com/ Contact a health care provider if:  You fall at home.  You are afraid of falling at home.  You feel weak, drowsy, or dizzy. Summary  Serious injuries from a fall happen most often to people older than age 52. Children and young adults ages 80-29 are also at higher risk.  Talk with your health care provider about your risks for falling and how to lower those risks.  Taking certain precautions at home can lower your risk for falling.  If you fall, always tell your health care provider. This information is not intended to replace advice given to you by your health care provider. Make sure you discuss any questions you have with your health care provider. Document Revised: 05/09/2020 Document Reviewed: 05/09/2020 Elsevier Patient Education  [place] 65 Years and Older, Female Preventive care refers to lifestyle choices and visits with your health care provider that can promote health and wellness. This includes:  A yearly physical exam. This is also called an annual wellness visit.  Regular dental and eye exams.  Immunizations.  Screening for certain conditions.  Healthy lifestyle choices, such as: ? Eating a healthy diet. ? Getting regular exercise. ? Not using drugs or products that contain nicotine and tobacco. ? Limiting alcohol use. What can I expect for my preventive care visit? Physical exam Your health care provider will check your:  Height and weight. These may be used to calculate your BMI (body mass index). BMI is a measurement that tells if you are at a healthy weight.  Heart rate and blood pressure.  Body temperature.  Skin for abnormal spots. Counseling Your health care provider may ask you questions about your:  Past medical problems.  Family's medical history.  Alcohol, tobacco, and drug use.  Emotional well-being.  Home life and relationship well-being.  Sexual activity.  Diet, exercise,  and sleep habits.  History of falls.  Memory and ability to understand (cognition).  Work and work Statistician.  Pregnancy and menstrual history.  Access to firearms. What immunizations do I need? Vaccines are usually given at various ages, according to a schedule. Your health care provider will recommend vaccines for you based on your age, medical history, and lifestyle or other factors, such as travel or where you work.   What tests do I need? Blood tests  Lipid and cholesterol levels. These may be checked every 5 years, or more often depending on your overall health.  Hepatitis C test.  Hepatitis B test. Screening  Lung cancer screening. You may have this screening every year starting at age 44 if you have a 30-pack-year history of smoking and currently smoke or have quit within the past 15 years.  Colorectal cancer screening. ? All adults should have this screening starting at age 40 and continuing until age 49. ? Your health care provider may recommend screening at age 43 if you are at increased risk. ?  You will have tests every 1-10 years, depending on your results and the type of screening test.  Diabetes screening. ? This is done by checking your blood sugar (glucose) after you have not eaten for a while (fasting). ? You may have this done every 1-3 years.  Mammogram. ? This may be done every 1-2 years. ? Talk with your health care provider about how often you should have regular mammograms.  Abdominal aortic aneurysm (AAA) screening. You may need this if you are a current or former smoker.  BRCA-related cancer screening. This may be done if you have a family history of breast, ovarian, tubal, or peritoneal cancers. Other tests  STD (sexually transmitted disease) testing, if you are at risk.  Bone density scan. This is done to screen for osteoporosis. You may have this done starting at age 54. Talk with your health care provider about your test results, treatment  options, and if necessary, the need for more tests. Follow these instructions at home: Eating and drinking  Eat a diet that includes fresh fruits and vegetables, whole grains, lean protein, and low-fat dairy products. Limit your intake of foods with high amounts of sugar, saturated fats, and salt.  Take vitamin and mineral supplements as recommended by your health care provider.  Do not drink alcohol if your health care provider tells you not to drink.  If you drink alcohol: ? Limit how much you have to 0-1 drink a day. ? Be aware of how much alcohol is in your drink. In the [place] one drink equals one 12 oz bottle of beer (355 mL), one 5 oz glass of wine (148 mL), or one 1 oz glass of hard liquor (44 mL).   Lifestyle  Take daily care of your teeth and gums. Brush your teeth every morning and night with fluoride toothpaste. Floss one time each day.  Stay active. Exercise for at least 30 minutes 5 or more days each week.  Do not use any products that contain nicotine or tobacco, such as cigarettes, e-cigarettes, and chewing tobacco. If you need help quitting, ask your health care provider.  Do not use drugs.  If you are sexually active, practice safe sex. Use a condom or other form of protection in order to prevent STIs (sexually transmitted infections).  Talk with your health care provider about taking a low-dose aspirin or statin.  Find healthy ways to cope with stress, such as: ? Meditation, yoga, or listening to music. ? Journaling. ? Talking to a trusted person. ? Spending time with friends and family. Safety  Always wear your seat belt while driving or riding in a vehicle.  Do not drive: ? If you have been drinking alcohol. Do not ride with someone who has been drinking. ? When you are tired or distracted. ? While texting.  Wear a helmet and other protective equipment during sports activities.  If you have firearms in your house, make sure you follow all gun safety  procedures. What's next?  Visit your health care provider once a year for an annual wellness visit.  Ask your health care provider how often you should have your eyes and teeth checked.  Stay up to date on all vaccines. This information is not intended to replace advice given to you by your health care provider. Make sure you discuss any questions you have with your health care provider. Document Revised: 09/26/2020 Document Reviewed: 09/30/2018 Elsevier Patient Education  2021 [employer].

## 2020-10-29 NOTE — Progress Notes (Signed)
Presents today for [employer] Visit   Date of last exam:10/17/2020  Interpreter used for this visit? No  I connected with  [name] on 10/29/20 by a telephone and verified that I am speaking with the correct person using two identifiers.   I discussed the limitations of evaluation and management by telemedicine. The patient expressed understanding and agreed to proceed.  Patient location: home  Provider location: in office  I provided 20 minutes of non face - to - face time during this encounter.  Patient Care Team: Horald Pollen, MD as PCP - General (Internal Medicine) [name], MD as PCP - Electrophysiology (Cardiology) Shawnee Knapp, MD as Resident (Family Medicine)   Other items to address today:   Discussed Eye/Dental Discussed Immunizations Follow up scheduled [place] 2/10 @ 10:00 am   Other Screening: Last screening for diabetes: 09/12/2020 Last lipid screening:09-12-2020  ADVANCE DIRECTIVES: Discussed: yes On File: no Materials Provided: yes  Immunization status:  Immunization History  Administered Date(s) Administered  . Fluad Quad(high Dose 65+) 12/31/2015  . Influenza Split 09/20/2011, 07/13/2012  . Influenza, High Dose Seasonal PF 08/07/2016, 08/04/2017, 07/21/2018, 07/22/2019  . Influenza,inj,Quad PF,6+ Mos 10/04/2013  . Influenza-Unspecified 09/20/2011, 07/13/2012, 10/04/2013, 08/07/2016  . Pneumococcal Conjugate-13 05/11/2014  . Pneumococcal Polysaccharide-23 07/13/2012  . Pneumococcal-Unspecified 07/15/2012  . Zoster 07/29/2012     Health Maintenance Due  Topic Date Due  . FOOT EXAM  Never done     Functional Status Survey: Is the patient deaf or have difficulty hearing?: No Does the patient have difficulty seeing, even when wearing glasses/contacts?: No Does the patient have difficulty concentrating, remembering, or making decisions?: No Does the patient have difficulty walking or climbing  stairs?: Yes Does the patient have difficulty dressing or bathing?: Yes Does the patient have difficulty doing errands alone such as visiting a doctor's office or shopping?: Yes   6CIT Screen 10/29/2020  What Year? 0 points  What month? 0 points  What time? 0 points  Count back from 20 0 points  Months in reverse 0 points  Repeat phrase 0 points  Total Score 0      Flowsheet Row Clinical Support from 10/29/2020 in [facility] [place]  AUDIT-C Score 0       Home Environment:   Physical therapy  Lives with husband Yes trouble climbing stairs No scattered rugs No grab bars Adequate lighting/ no clutter Shower seat    Patient Active Problem List   Diagnosis Date Noted  . Persistent atrial fibrillation ([place]) 08/12/2020  . Type 2 diabetes mellitus with hyperglycemia, with long-term current use of insulin ([place]) 06/21/2020  . Type 2 diabetes mellitus with diabetic polyneuropathy, with long-term current use of insulin ([place]) 06/21/2020  . Mixed hyperlipidemia 06/21/2020  . Uncontrolled hypertension 06/11/2020  . Aortic atherosclerosis ([place]) 03/28/2020  . Diabetic peripheral neuropathy ([place]) 12/26/2019  . Gastroesophageal reflux disease without esophagitis 03/17/2019  . Epigastric pain 02/16/2019  . Bilateral leg edema 02/16/2019  . Dysgeusia 06/11/2017  . Acute pain of left knee 06/03/2017  . Allergic dermatitis 06/03/2017  . Obesity 12/19/2015  . Hypersomnia 12/19/2015  . Edema 10/18/2015  . Acquired hypothyroidism 10/18/2015  . Acute diastolic congestive heart failure, NYHA class 3 ([place]) 10/03/2013  . Prolonged QT interval 10/03/2013  . Acute diastolic heart failure ([place]) 10/03/2013  . Long term (current) use of anticoagulants 06/09/2012  . BENIGN NEOPLASM OF ADRENAL GLAND 11/25/2010  . Chronic diastolic heart failure ([place]) 02/20/2010  .  DM 05/16/2009  . GOUT 05/16/2009  . OBESITY, MORBID 05/16/2009  . Hypertension associated with diabetes ([place]) 05/16/2009  .  Paroxysmal atrial fibrillation ([place]) 05/16/2009  . HYPERLIPIDEMIA 11/30/2008  . Obstructive sleep apnea on CPAP 11/30/2008     Past Medical History:  Diagnosis Date  . Arthritis   . Back pain   . Chronic anticoagulation    due to aflutter  . Chronic kidney disease   . Diabetes mellitus   . Diastolic CHF, chronic ([place])    a.  echo 2006 - ef 55-65%; mild diast dysfxn;    b. Echo 08/2011: Mild LVH, EF 60%;  c. 04/2013 Echo: EF 65-69%, mild conc LVH;  08/2014 Echo: EF 60-65%, mild-mod MR.  . Gout   . Hyperlipidemia   . Hypertension    a.  Renal arterial Dopplers 12/2011: 1-59% right renal artery stenosis  . Morbid obesity ([place])   . Obstructive sleep apnea on CPAP   . Paroxysmal Afib/Flutter    a. 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 APPENDECTOMY    . ATRIAL FLUTTER ABLATION N/A 09/24/2011   Procedure: ATRIAL FLUTTER ABLATION;  Surgeon: [name], MD;  Location: [facility] CATH LAB;  Service: Cardiovascular;  Laterality: N/A;  . CARDIOVERSION  10/22/2011   Procedure: CARDIOVERSION;  Surgeon: [name], MD;  Location: [place];  Service: Cardiovascular;  Laterality: N/A;  . CARDIOVERSION N/A 09/10/2011   Procedure: CARDIOVERSION;  Surgeon: [name], MD;  Location: [facility] CATH LAB;  Service: Cardiovascular;  Laterality: N/A;  . CHOLECYSTECTOMY    . TONSILLECTOMY  1982  . TOTAL ABDOMINAL HYSTERECTOMY       Family History  Problem Relation Age of Onset  . Heart disease Father   . Hypertension Father   . Breast cancer Sister   . Cancer Sister        breast     Social History   Socioeconomic History  . Marital status: Married    Spouse name: Not on file  . Number of children: 3  . Years of education: Not on file  . Highest education level: Not on file  Occupational History  . Occupation: DISABILITY/housewife    Employer: RETIRED  Tobacco Use  . Smoking status: Never Smoker  . Smokeless tobacco: Never Used   Vaping Use  . Vaping Use: Never used  Substance and Sexual Activity  . Alcohol use: No  . Drug use: No  . Sexual activity: Yes  Other Topics Concern  . Not on file  Social History Narrative  . Not on file   Social Determinants of Health   Financial Resource Strain: Not on file  Food Insecurity: Not on file  Transportation Needs: Not on file  Physical Activity: Not on file  Stress: Not on file  Social Connections: Not on file  Intimate Partner Violence: Not on file     Allergies  Allergen Reactions  . Metformin Diarrhea     Prior to Admission medications   Medication Sig Start Date End Date Taking? Authorizing Provider  Accu-Chek Softclix Lancets lancets 1 each by Other route 3 (three) times daily. as directed 03/30/20  Yes Maximiano Coss, NP  allopurinol (ZYLOPRIM) 300 MG tablet Take 300 mg by mouth daily.  07/07/13  Yes [provider]  amiodarone (PACERONE) 200 MG tablet Take 1 tablet (200 mg total) by mouth daily. 07/18/20  Yes Sherran Needs, NP  apixaban (ELIQUIS) 5 MG  TABS tablet Take 1 tablet (5 mg total) by mouth 2 (two) times daily. 06/14/20  Yes [name], MD  blood glucose meter kit and supplies Dispense based on patient and insurance preference. Use up to four times daily as directed. (FOR ICD-10 E10.9, E11.9). 05/03/20  Yes Sagardia, Ines Bloomer, MD  cloNIDine (CATAPRES) 0.3 MG tablet TAKE 1 TABLET(0.3 MG) BY MOUTH TWICE DAILY 09/30/20  Yes Sagardia, Ines Bloomer, MD  Continuous Blood Gluc Receiver (FREESTYLE LIBRE 14 DAY READER) DEVI Use as directed. 04/18/20  Yes Sagardia, Ines Bloomer, MD  Continuous Blood Gluc Sensor (FREESTYLE LIBRE 14 DAY SENSOR) MISC Use as directed to check blood sugar daily 09/24/20  Yes [name], Melanie Crazier, MD  cyclobenzaprine (FLEXERIL) 5 MG tablet  07/08/20  Yes [provider]  diltiazem (CARDIZEM) 30 MG tablet Take 30 mg by mouth. Take one tablet every 4 hours as needed for heart rate greater than 100 and  blood pressure needs to be above 100   Yes Sherran Needs, NP  Insulin Lispro Prot & Lispro (HUMALOG 75/25 MIX) (75-25) 100 UNIT/ML Kwikpen Inject 15-21 Units into the skin with breakfast, with lunch, and with evening meal. Sliding scale insulin 08/12/16  Yes [provider]  levothyroxine (SYNTHROID, LEVOTHROID) 75 MCG tablet Take 75 mcg daily before breakfast by mouth.  12/29/16  Yes [provider]  lidocaine (LIDODERM) 5 % Place 1 patch onto the skin daily. Remove & Discard patch within 12 hours or as directed by MD 08/29/20  Yes Mcarthur Rossetti, MD  loperamide (IMODIUM) 2 MG capsule Take 1 capsule (2 mg total) by mouth 4 (four) times daily as needed for diarrhea or loose stools. 03/20/20  Yes Garald Balding, PA-C  losartan (COZAAR) 100 MG tablet Take 1 tablet (100 mg total) by mouth daily. 05/31/20  Yes Sagardia, Ines Bloomer, MD  NOVOFINE PLUS 32G X 4 MM MISC 1 each by Subdermal route 2 (two) times daily. as directed 03/30/20  Yes Maximiano Coss, NP  [facility] VERIO test strip 1 each 3 (three) times daily. 06/08/20  Yes [provider]  potassium chloride (KLOR-CON) 10 MEQ tablet Take 2 tablets (20 mEq total) by mouth daily. 10/18/19  Yes [name], MD  RESTASIS MULTIDOSE 0.05 % ophthalmic emulsion Place 1 drop into both eyes 2 (two) times daily.  03/04/20  Yes [provider]  rosuvastatin (CRESTOR) 10 MG tablet Take 10 mg by mouth every evening.   Yes [provider]  Semaglutide,0.25 or 0.5MG/DOS, (OZEMPIC, 0.25 OR 0.5 MG/DOSE,) 2 MG/1.5ML SOPN Inject 0.75 mLs (1 mg total) into the skin once a week. 04/18/20  Yes Sagardia, Ines Bloomer, MD  tiZANidine (ZANAFLEX) 4 MG tablet Take 1 tablet (4 mg total) by mouth every 8 (eight) hours as needed for muscle spasms. 08/29/20  Yes Mcarthur Rossetti, MD  torsemide (DEMADEX) 20 MG tablet Take 2 tablets (40 mg total) by mouth daily. 09/29/19  Yes [name], MD  triamcinolone cream (KENALOG)  0.1 % Apply 1 application topically 2 (two) times daily. 07/26/20  Yes Sagardia, Ines Bloomer, MD  verapamil (CALAN) 120 MG tablet Take 1 tablet (120 mg total) by mouth 2 (two) times daily. 08/15/20  Yes [name], MD  HYDROcodone-homatropine [facility]) 5-1.5 MG/5ML syrup Take 5 mLs by mouth at bedtime as needed for cough. Patient not taking: Reported on 10/29/2020 10/17/20   Horald Pollen, MD     Depression screen [facility] 2/9 10/29/2020 10/29/2020 10/17/2020 09/11/2020 08/31/2020  Decreased Interest 0 0 0 0 0  Down, Depressed, Hopeless 0 0 0 0 0  PHQ - 2 Score 0 0 0 0 0  Some recent data might be hidden     Fall Risk  10/29/2020 10/17/2020 09/11/2020 08/31/2020 08/06/2020  Falls in the past year? '1 1 1 1 1  ' Number falls in past yr: '1 1 1 1 1  ' Comment fell on knee on right, 1st fell in [place] missed step. 2nd fell at hotel cart fell on knee again and shoulder. - - - -  Injury with Fall? '1 1 1 1 ' 0  Comment - - right shoulder, arm - -  Risk for fall due to : History of fall(s) - - - -  Follow up Falls evaluation completed;Education provided Falls evaluation completed Falls evaluation completed Falls evaluation completed Falls evaluation completed      PHYSICAL EXAM: There were no vitals taken for this visit.   Wt Readings from Last 3 Encounters:  10/17/20 251 lb (113.9 kg)  09/24/20 254 lb (115.2 kg)  09/11/20 255 lb (115.7 kg)       Education/Counseling provided regarding diet and exercise, prevention of chronic diseases, smoking/tobacco cessation, if applicable, and reviewed "Covered Medicare Preventive Services."

## 2020-10-29 NOTE — Telephone Encounter (Signed)
Spoke with patient advised that orders were put in for walker and bedside comode Bariatric.

## 2020-10-30 ENCOUNTER — Telehealth: Payer: Self-pay | Admitting: *Deleted

## 2020-10-30 DIAGNOSIS — G4733 Obstructive sleep apnea (adult) (pediatric): Secondary | ICD-10-CM | POA: Diagnosis not present

## 2020-10-30 NOTE — Telephone Encounter (Signed)
Faxed over new DME for bariatric walker and bedside commode successfully.

## 2020-11-02 ENCOUNTER — Ambulatory Visit (INDEPENDENT_AMBULATORY_CARE_PROVIDER_SITE_OTHER): Payer: Medicare Other | Admitting: Internal Medicine

## 2020-11-02 ENCOUNTER — Other Ambulatory Visit: Payer: Self-pay

## 2020-11-02 ENCOUNTER — Encounter: Payer: Self-pay | Admitting: Internal Medicine

## 2020-11-02 VITALS — BP 126/74 | HR 62 | Ht 61.0 in | Wt 253.0 lb

## 2020-11-02 DIAGNOSIS — I4819 Other persistent atrial fibrillation: Secondary | ICD-10-CM | POA: Diagnosis not present

## 2020-11-02 DIAGNOSIS — E876 Hypokalemia: Secondary | ICD-10-CM

## 2020-11-02 NOTE — Progress Notes (Signed)
Patient Care Team: Horald Pollen, MD as PCP - General (Internal Medicine) [name], MD as PCP - Electrophysiology (Cardiology) Shawnee Knapp, MD as Resident (Family Medicine)   HPI  [name] is a 80 y.o. female seen in followup for atrial arrhythmias/fibrillation associated with diastolic heart failure. She's been tried on flecainide, dofetilide, and most recently has been on amiodarone.    On Apixoban  W/out bleeding  No interval atrial fibrillation since we have increased her amiodarone to 7 days a week.  Tolerating verapamil.  Denies edema.  Chronic dyspnea.  No chest pain.  No GI intolerance or cough neurological symptoms suggestive of amiodarone toxicity.     Had been followed for sleep apnea [facility]    DATE TEST EF   11/12 Echo   60-65 %   11/15 Echo   60-65 % MR mild-mod  8/17 Echo  60-65% LAE (47/2.1/38)             Date Cr K Hgb TSH LFTs PFTs  8/17    2.444    3/18    0.175    4/19  0.93 4.0 13.3   17 106%  6/19 0.75 4.1      8/20 0.85 3.8 12.4 0.95    9/21 0.86 3.3 12.7 2.05 15   11/21 0.78 3.4 12.2   11         Thromboembolic risk factors ( age  -2, HTN-1, DM-1, Gender-1) for a CHADSVASc Score of >=5          Past Medical History:  Diagnosis Date  . Arthritis   . Back pain   . Chronic anticoagulation    due to aflutter  . Chronic kidney disease   . Diabetes mellitus   . Diastolic CHF, chronic ([place])    a.  echo 2006 - ef 55-65%; mild diast dysfxn;    b. Echo 08/2011: Mild LVH, EF 60%;  c. 04/2013 Echo: EF 65-69%, mild conc LVH;  08/2014 Echo: EF 60-65%, mild-mod MR.  . Gout   . Hyperlipidemia   . Hypertension    a.  Renal arterial Dopplers 12/2011: 1-59% right renal artery stenosis  . Morbid obesity ([place])   . Obstructive sleep apnea on CPAP   . Paroxysmal Afib/Flutter    a. 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 APPENDECTOMY    . ATRIAL FLUTTER ABLATION  N/A 09/24/2011   Procedure: ATRIAL FLUTTER ABLATION;  Surgeon: [name], MD;  Location: [facility] CATH LAB;  Service: Cardiovascular;  Laterality: N/A;  . CARDIOVERSION  10/22/2011   Procedure: CARDIOVERSION;  Surgeon: [name], MD;  Location: [place];  Service: Cardiovascular;  Laterality: N/A;  . CARDIOVERSION N/A 09/10/2011   Procedure: CARDIOVERSION;  Surgeon: [name], MD;  Location: [facility] CATH LAB;  Service: Cardiovascular;  Laterality: N/A;  . CHOLECYSTECTOMY    . TONSILLECTOMY  1982  . TOTAL ABDOMINAL HYSTERECTOMY      Current Outpatient Medications  Medication Sig Dispense Refill  . Accu-Chek Softclix Lancets lancets 1 each by Other route 3 (three) times daily. as directed 100 each 3  . allopurinol (ZYLOPRIM) 300 MG tablet Take 300 mg by mouth daily.     Marland Kitchen amiodarone (PACERONE) 200 MG tablet Take 1 tablet (200 mg total) by mouth daily. 90 tablet 3  . apixaban (ELIQUIS) 5 MG TABS tablet Take 1 tablet (5 mg total) by mouth  2 (two) times daily. 180 tablet 1  . blood glucose meter kit and supplies Dispense based on patient and insurance preference. Use up to four times daily as directed. (FOR ICD-10 E10.9, E11.9). 1 each 0  . cloNIDine (CATAPRES) 0.3 MG tablet TAKE 1 TABLET(0.3 MG) BY MOUTH TWICE DAILY 180 tablet 0  . Continuous Blood Gluc Receiver (FREESTYLE LIBRE 14 DAY READER) DEVI Use as directed. 1 each 0  . Continuous Blood Gluc Sensor (FREESTYLE LIBRE 14 DAY SENSOR) MISC Use as directed to check blood sugar daily 2 each 3  . cyclobenzaprine (FLEXERIL) 5 MG tablet     . diltiazem (CARDIZEM) 30 MG tablet Take 30 mg by mouth. Take one tablet every 4 hours as needed for heart rate greater than 100 and blood pressure needs to be above 100    . HYDROcodone-homatropine (HYCODAN) 5-1.5 MG/5ML syrup Take 5 mLs by mouth at bedtime as needed for cough. 120 mL 0  . Insulin Lispro Prot & Lispro (HUMALOG 75/25 MIX) (75-25) 100 UNIT/ML Kwikpen Inject 15-21 Units into the skin with breakfast,  with lunch, and with evening meal. Sliding scale insulin    . levothyroxine (SYNTHROID, LEVOTHROID) 75 MCG tablet Take 75 mcg daily before breakfast by mouth.     . lidocaine (LIDODERM) 5 % Place 1 patch onto the skin daily. Remove & Discard patch within 12 hours or as directed by MD 30 patch 0  . loperamide (IMODIUM) 2 MG capsule Take 1 capsule (2 mg total) by mouth 4 (four) times daily as needed for diarrhea or loose stools. 12 capsule 0  . losartan (COZAAR) 100 MG tablet Take 1 tablet (100 mg total) by mouth daily. 90 tablet 3  . NOVOFINE PLUS 32G X 4 MM MISC 1 each by Subdermal route 2 (two) times daily. as directed 60 each 3  . ONETOUCH VERIO test strip 1 each 3 (three) times daily.    . potassium chloride (KLOR-CON) 10 MEQ tablet Take 2 tablets (20 mEq total) by mouth daily. 180 tablet 3  . RESTASIS MULTIDOSE 0.05 % ophthalmic emulsion Place 1 drop into both eyes 2 (two) times daily.     . rosuvastatin (CRESTOR) 10 MG tablet Take 10 mg by mouth every evening.    . Semaglutide,0.25 or 0.5MG/DOS, (OZEMPIC, 0.25 OR 0.5 MG/DOSE,) 2 MG/1.5ML SOPN Inject 0.75 mLs (1 mg total) into the skin once a week. 12 pen 3  . tiZANidine (ZANAFLEX) 4 MG tablet Take 1 tablet (4 mg total) by mouth every 8 (eight) hours as needed for muscle spasms. 60 tablet 1  . torsemide (DEMADEX) 20 MG tablet Take 2 tablets (40 mg total) by mouth daily. 180 tablet 3  . triamcinolone cream (KENALOG) 0.1 % Apply 1 application topically 2 (two) times daily. 30 g 0  . verapamil (CALAN) 120 MG tablet Take 1 tablet (120 mg total) by mouth 2 (two) times daily. 180 tablet 3   No current facility-administered medications for this visit.    Allergies  Allergen Reactions  . Metformin Diarrhea    Review of Systems negative except from HPI and PMH  Physical Exam   BP 126/74   Pulse 62   Ht '5\' 1"'  (1.549 m)   Wt 253 lb (114.8 kg)   SpO2 93%   BMI 47.80 kg/m  Well developed and Morbidly obese in no acute distress HENT  normal Neck supple   Clear Regular rate and rhythm, no   murmur Abd-soft with active BS No Clubbing cyanosis  *  edema Skin-warm and dry A & Oriented  Grossly normal sensory and motor function     ECG sinus at 62 Interval 23/08/47  Assessment and  Plan  Hypertension   Persistent atrial fibrillation   HFpEF  Chronic  Hypokalemia  Rash  Obstructive sleep apnea-treated  High Risk Medication Surveillance-=amiodarone  Morbidly obese   Holding sinus rhythm.  Continue amiodarone.  We will plan recheck in 4 months to try to get her back on her 4-monthsurveillance schedule.  Continue verapamil.  Currently euvolemic.  Continue current diuretics.  We need to make sure that she has her sleep apnea being monitored somewhere.  [facility]  We will defer this at this point to her PCP

## 2020-11-02 NOTE — Patient Instructions (Signed)
Medication Instructions:  Your physician recommends that you continue on your current medications as directed. Please refer to the Current Medication list given to you today.  *If you need a refill on your cardiac medications before your next appointment, please call your pharmacy*   Lab Work: BMET today  If you have labs (blood work) drawn today and your tests are completely normal, you will receive your results only by: Marland Kitchen MyChart Message (if you have MyChart) OR . A paper copy in the mail If you have any lab test that is abnormal or we need to change your treatment, we will call you to review the results.   Testing/Procedures: None ordered.    Follow-Up: At [facility], you and your health needs are our priority.  As part of our continuing mission to provide you with exceptional heart care, we have created designated Provider Care Teams.  These Care Teams include your primary Cardiologist (physician) and Advanced Practice Providers (APPs -  Physician Assistants and Nurse Practitioners) who all work together to provide you with the care you need, when you need it.  We recommend signing up for the patient portal called "MyChart".  Sign up information is provided on this After Visit Summary.  MyChart is used to connect with patients for Virtual Visits (Telemedicine).  Patients are able to view lab/test results, encounter notes, upcoming appointments, etc.  Non-urgent messages can be sent to your provider as well.   To learn more about what you can do with MyChart, go to [email or web address].    Your next appointment:   4 month(s)  The format for your next appointment:   In Person  Provider:   [name], MD

## 2020-11-03 LAB — BASIC METABOLIC PANEL
BUN/Creatinine Ratio: 18 (ref 12–28)
BUN: 14 mg/dL (ref 8–27)
CO2: 30 mmol/L — ABNORMAL HIGH (ref 20–29)
Calcium: 9.6 mg/dL (ref 8.7–10.3)
Chloride: 98 mmol/L (ref 96–106)
Creatinine, Ser: 0.78 mg/dL (ref 0.57–1.00)
GFR calc Af Amer: 85 mL/min/{1.73_m2} (ref >59)
GFR calc non Af Amer: 74 mL/min/{1.73_m2} (ref >59)
Glucose: 124 mg/dL — ABNORMAL HIGH (ref 65–99)
Potassium: 4 mmol/L (ref 3.5–5.2)
Sodium: 144 mmol/L (ref 134–144)

## 2020-11-03 LAB — SPECIMEN STATUS REPORT

## 2020-11-07 ENCOUNTER — Other Ambulatory Visit: Payer: Self-pay | Admitting: Internal Medicine

## 2020-11-07 ENCOUNTER — Telehealth: Payer: Self-pay | Admitting: *Deleted

## 2020-11-07 DIAGNOSIS — I480 Paroxysmal atrial fibrillation: Secondary | ICD-10-CM | POA: Diagnosis not present

## 2020-11-07 DIAGNOSIS — R296 Repeated falls: Secondary | ICD-10-CM | POA: Diagnosis not present

## 2020-11-07 DIAGNOSIS — G8911 Acute pain due to trauma: Secondary | ICD-10-CM | POA: Diagnosis not present

## 2020-11-07 DIAGNOSIS — M25511 Pain in right shoulder: Secondary | ICD-10-CM | POA: Diagnosis not present

## 2020-11-07 DIAGNOSIS — E1142 Type 2 diabetes mellitus with diabetic polyneuropathy: Secondary | ICD-10-CM | POA: Diagnosis not present

## 2020-11-07 DIAGNOSIS — I130 Hypertensive heart and chronic kidney disease with heart failure and stage 1 through stage 4 chronic kidney disease, or unspecified chronic kidney disease: Secondary | ICD-10-CM | POA: Diagnosis not present

## 2020-11-07 DIAGNOSIS — I5032 Chronic diastolic (congestive) heart failure: Secondary | ICD-10-CM | POA: Diagnosis not present

## 2020-11-07 DIAGNOSIS — E1122 Type 2 diabetes mellitus with diabetic chronic kidney disease: Secondary | ICD-10-CM | POA: Diagnosis not present

## 2020-11-07 DIAGNOSIS — N189 Chronic kidney disease, unspecified: Secondary | ICD-10-CM | POA: Diagnosis not present

## 2020-11-07 NOTE — Telephone Encounter (Signed)
Faxed signed orders Plan of Care to [facility]. Confirmation at 12:21 pm.

## 2020-11-08 ENCOUNTER — Ambulatory Visit: Payer: Self-pay | Admitting: *Deleted

## 2020-11-08 NOTE — Telephone Encounter (Signed)
  Pt called in c/o lower middle abd. Pressure over her bladder area.  Denies burning with urination but she is going more frequently than what is normal for her.  This started yesterday. She mentioned she was having abd cramping but had a large BM yesterday so no longer having the cramping.   Just pressure over the bladder area.  [facility] does not schedule for Primary Care on [place] so I sent my notes to them for scheduling.  I made it high priority at pt's request to be seen before the weekend and bad weather.  (forcasting snow).    She can be reached at [phone]. Reason for Disposition . Age > 60 years  Answer Assessment - Initial Assessment Questions 1. LOCATION: "Where does it hurt?"      Last 4 days I've had bad cramps in my abd.   Every time I cramped I went to the bathroom having small BMs.   Yesterday the therapist was here at my house and I had a big accident and had a large BM.    I feel better now.   No more abd cramps.  I'm urinating more often.  I've had a urinary infection.  I also take fluid pills in the morning which makes me go often. I have a lot of pressure in my lower mid abd under my belly button but low down. 2. RADIATION: "Does the pain shoot anywhere else?" (e.g., chest, back)     No 3. ONSET: "When did the pain begin?" (e.g., minutes, hours or days ago)      Yesterday the pressure and frequent urination 4. SUDDEN: "Gradual or sudden onset?"     Gradual 5. PATTERN "Does the pain come and go, or is it constant?"    - If constant: "Is it getting better, staying the same, or worsening?"      (Note: Constant means the pain never goes away completely; most serious pain is constant and it progresses)     - If intermittent: "How long does it last?" "Do you have pain now?"     (Note: Intermittent means the pain goes away completely between bouts)     The pressure is constant.  6. SEVERITY: "How bad is the pain?"  (e.g., Scale 1-10; mild, moderate, or severe)   - MILD (1-3):  doesn't interfere with normal activities, abdomen soft and not tender to touch    - MODERATE (4-7): interferes with normal activities or awakens from sleep, tender to touch    - SEVERE (8-10): excruciating pain, doubled over, unable to do any normal activities      6 for pressure 7. RECURRENT SYMPTOM: "Have you ever had this type of stomach pain before?" If Yes, ask: "When was the last time?" and "What happened that time?"      Yes for urinary tract infections 8. CAUSE: "What do you think is causing the stomach pain?"     Maybe a UTI 9. RELIEVING/AGGRAVATING FACTORS: "What makes it better or worse?" (e.g., movement, antacids, bowel movement)     BM did help the abd cramping. 10. OTHER SYMPTOMS: "Has there been any vomiting, diarrhea, constipation, or urine problems?"       *No Answer* 11. PREGNANCY: "Is there any chance you are pregnant?" "When was your last menstrual period?"       *No Answer*  Protocols used: ABDOMINAL PAIN - [facility]

## 2020-11-09 DIAGNOSIS — I130 Hypertensive heart and chronic kidney disease with heart failure and stage 1 through stage 4 chronic kidney disease, or unspecified chronic kidney disease: Secondary | ICD-10-CM | POA: Diagnosis not present

## 2020-11-09 DIAGNOSIS — I480 Paroxysmal atrial fibrillation: Secondary | ICD-10-CM | POA: Diagnosis not present

## 2020-11-09 DIAGNOSIS — R296 Repeated falls: Secondary | ICD-10-CM | POA: Diagnosis not present

## 2020-11-09 DIAGNOSIS — I5032 Chronic diastolic (congestive) heart failure: Secondary | ICD-10-CM | POA: Diagnosis not present

## 2020-11-09 DIAGNOSIS — M25511 Pain in right shoulder: Secondary | ICD-10-CM | POA: Diagnosis not present

## 2020-11-09 DIAGNOSIS — E1142 Type 2 diabetes mellitus with diabetic polyneuropathy: Secondary | ICD-10-CM | POA: Diagnosis not present

## 2020-11-09 DIAGNOSIS — G8911 Acute pain due to trauma: Secondary | ICD-10-CM | POA: Diagnosis not present

## 2020-11-09 DIAGNOSIS — E1122 Type 2 diabetes mellitus with diabetic chronic kidney disease: Secondary | ICD-10-CM | POA: Diagnosis not present

## 2020-11-09 DIAGNOSIS — N189 Chronic kidney disease, unspecified: Secondary | ICD-10-CM | POA: Diagnosis not present

## 2020-11-09 NOTE — Telephone Encounter (Signed)
Advised pt we do not have availability today to be seen advised Urgent care as she is having UTI symptoms. Pt got upset and said she wouldn't go and she would find a new doctor because she can never see [place] the same day.

## 2020-11-11 ENCOUNTER — Encounter: Payer: Self-pay | Admitting: Registered Nurse

## 2020-11-11 NOTE — Progress Notes (Signed)
Acute Office Visit  Subjective:    Patient ID: [name], female    DOB: [birth year], 80 y.o.   MRN: [id number]  Chief Complaint  Patient presents with  . Fall    Patient states she fell again on 08/21/2020. Per patient she fell off of the steps. She feel to her knees which both have bruises and also havinf servere left shoulder pain. She is having trouble walking and getting dressed,    HPI Patient is in today for fall  On 08/21/20 [place] off steps Purely mechanical Knees bruised, left shoulder pain No changes in sensation or strength, but pain with ROM Has been trying to use nonpharm but not helping States that she is already set up for assessment with ortho Just hoping for relief in the mean time.  Past Medical History:  Diagnosis Date  . Arthritis   . Back pain   . Chronic anticoagulation    due to aflutter  . Chronic kidney disease   . Diabetes mellitus   . Diastolic CHF, chronic ([place])    a.  echo 2006 - ef 55-65%; mild diast dysfxn;    b. Echo 08/2011: Mild LVH, EF 60%;  c. 04/2013 Echo: EF 65-69%, mild conc LVH;  08/2014 Echo: EF 60-65%, mild-mod MR.  . Gout   . Hyperlipidemia   . Hypertension    a.  Renal arterial Dopplers 12/2011: 1-59% right renal artery stenosis  . Morbid obesity ([place])   . Obstructive sleep apnea on CPAP   . Paroxysmal Afib/Flutter    a. 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 APPENDECTOMY    . ATRIAL FLUTTER ABLATION N/A 09/24/2011   Procedure: ATRIAL FLUTTER ABLATION;  Surgeon: [name], MD;  Location: [facility] CATH LAB;  Service: Cardiovascular;  Laterality: N/A;  . CARDIOVERSION  10/22/2011   Procedure: CARDIOVERSION;  Surgeon: [name], MD;  Location: [facility];  Service: Cardiovascular;  Laterality: N/A;  . CARDIOVERSION N/A 09/10/2011   Procedure: CARDIOVERSION;  Surgeon: [name], MD;  Location: [facility] CATH LAB;  Service: Cardiovascular;  Laterality: N/A;  .  CHOLECYSTECTOMY    . TONSILLECTOMY  1982  . TOTAL ABDOMINAL HYSTERECTOMY      Family History  Problem Relation Age of Onset  . Heart disease Father   . Hypertension Father   . Breast cancer Sister   . Cancer Sister        breast    Social History   Socioeconomic History  . Marital status: Married    Spouse name: Not on file  . Number of children: 3  . Years of education: Not on file  . Highest education level: Not on file  Occupational History  . Occupation: DISABILITY/housewife    Employer: RETIRED  Tobacco Use  . Smoking status: Never Smoker  . Smokeless tobacco: Never Used  Vaping Use  . Vaping Use: Never used  Substance and Sexual Activity  . Alcohol use: No  . Drug use: No  . Sexual activity: Yes  Other Topics Concern  . Not on file  Social History Narrative  . Not on file   Social Determinants of Health   Financial Resource Strain: Not on file  Food Insecurity: Not on file  Transportation Needs: Not on file  Physical Activity: Not on file  Stress: Not on file  Social Connections: Not on file  Intimate Partner Violence: Not on file    Outpatient Medications Prior  to Visit  Medication Sig Dispense Refill  . Accu-Chek Softclix Lancets lancets 1 each by Other route 3 (three) times daily. as directed 100 each 3  . allopurinol (ZYLOPRIM) 300 MG tablet Take 300 mg by mouth daily.     Marland Kitchen amiodarone (PACERONE) 200 MG tablet Take 1 tablet (200 mg total) by mouth daily. 90 tablet 3  . apixaban (ELIQUIS) 5 MG TABS tablet Take 1 tablet (5 mg total) by mouth 2 (two) times daily. 180 tablet 1  . blood glucose meter kit and supplies Dispense based on patient and insurance preference. Use up to four times daily as directed. (FOR ICD-10 E10.9, E11.9). 1 each 0  . Continuous Blood Gluc Receiver (FREESTYLE LIBRE 14 DAY READER) DEVI Use as directed. 1 each 0  . diltiazem (CARDIZEM) 30 MG tablet Take 30 mg by mouth. Take one tablet every 4 hours as needed for heart rate  greater than 100 and blood pressure needs to be above 100    . Insulin Lispro Prot & Lispro (HUMALOG 75/25 MIX) (75-25) 100 UNIT/ML Kwikpen Inject 15-21 Units into the skin with breakfast, with lunch, and with evening meal. Sliding scale insulin    . levothyroxine (SYNTHROID, LEVOTHROID) 75 MCG tablet Take 75 mcg daily before breakfast by mouth.     . lidocaine (LIDODERM) 5 % Place 1 patch onto the skin daily. Remove & Discard patch within 12 hours or as directed by MD 30 patch 0  . loperamide (IMODIUM) 2 MG capsule Take 1 capsule (2 mg total) by mouth 4 (four) times daily as needed for diarrhea or loose stools. 12 capsule 0  . losartan (COZAAR) 100 MG tablet Take 1 tablet (100 mg total) by mouth daily. 90 tablet 3  . NOVOFINE PLUS 32G X 4 MM MISC 1 each by Subdermal route 2 (two) times daily. as directed 60 each 3  . potassium chloride (KLOR-CON) 10 MEQ tablet Take 2 tablets (20 mEq total) by mouth daily. 180 tablet 3  . RESTASIS MULTIDOSE 0.05 % ophthalmic emulsion Place 1 drop into both eyes 2 (two) times daily.     . rosuvastatin (CRESTOR) 10 MG tablet Take 10 mg by mouth every evening.    . Semaglutide,0.25 or 0.5MG/DOS, (OZEMPIC, 0.25 OR 0.5 MG/DOSE,) 2 MG/1.5ML SOPN Inject 0.75 mLs (1 mg total) into the skin once a week. 12 pen 3  . tiZANidine (ZANAFLEX) 4 MG tablet Take 1 tablet (4 mg total) by mouth every 8 (eight) hours as needed for muscle spasms. 60 tablet 1  . triamcinolone cream (KENALOG) 0.1 % Apply 1 application topically 2 (two) times daily. 30 g 0  . verapamil (CALAN) 120 MG tablet Take 1 tablet (120 mg total) by mouth 2 (two) times daily. 180 tablet 3  . cloNIDine (CATAPRES) 0.3 MG tablet Take 1 tablet (0.3 mg total) by mouth 2 (two) times daily. 60 tablet 3  . torsemide (DEMADEX) 20 MG tablet Take 2 tablets (40 mg total) by mouth daily. 180 tablet 3  . traMADol (ULTRAM) 50 MG tablet Take 2 tablets (100 mg total) by mouth every 6 (six) hours as needed. 40 tablet 0   No  facility-administered medications prior to visit.    Allergies  Allergen Reactions  . Metformin Diarrhea    Review of Systems  Constitutional: Negative.   HENT: Negative.   Eyes: Negative.   Respiratory: Negative.   Cardiovascular: Negative.   Gastrointestinal: Negative.   Genitourinary: Negative.   Musculoskeletal: Negative.   Skin: Negative.  Neurological: Negative.   Psychiatric/Behavioral: Negative.   All other systems reviewed and are negative.      Objective:    Physical Exam Vitals and nursing note reviewed.  Constitutional:      General: She is not in acute distress.    Appearance: Normal appearance. She is not ill-appearing, toxic-appearing or diaphoretic.  Cardiovascular:     Rate and Rhythm: Normal rate and regular rhythm.     Pulses: Normal pulses.     Heart sounds: Normal heart sounds. No murmur heard. No friction rub. No gallop.   Pulmonary:     Effort: Pulmonary effort is normal. No respiratory distress.     Breath sounds: Normal breath sounds. No stridor. No wheezing, rhonchi or rales.  Chest:     Chest wall: No tenderness.  Musculoskeletal:        General: Swelling, tenderness and signs of injury present. No deformity.     Right lower leg: No edema.     Left lower leg: No edema.  Skin:    General: Skin is warm and dry.     Capillary Refill: Capillary refill takes less than 2 seconds.  Neurological:     General: No focal deficit present.     Mental Status: She is alert and oriented to person, place, and time. Mental status is at baseline.  Psychiatric:        Mood and Affect: Mood normal.        Behavior: Behavior normal.        Thought Content: Thought content normal.        Judgment: Judgment normal.     BP 129/64   Pulse 61   Temp 98 F (36.7 C) (Temporal)   Resp 18   Ht '5\' 1"'  (1.549 m)   Wt 255 lb 3.2 oz (115.8 kg)   SpO2 95%   BMI 48.22 kg/m  Wt Readings from Last 3 Encounters:  11/02/20 253 lb (114.8 kg)  10/29/20 254 lb  (115.2 kg)  10/17/20 251 lb (113.9 kg)    Health Maintenance Due  Topic Date Due  . FOOT EXAM  Never done  . COVID-19 Vaccine (1) Never done    There are no preventive care reminders to display for this patient.   Lab Results  Component Value Date   TSH 2.050 07/05/2020   Lab Results  Component Value Date   WBC 11.9 (H) 09/12/2020   HGB 12.2 09/12/2020   HCT 39.1 09/12/2020   MCV 84 09/12/2020   PLT 350 09/12/2020   Lab Results  Component Value Date   NA 144 11/02/2020   K 4.0 11/02/2020   CO2 30 (H) 11/02/2020   GLUCOSE 124 (H) 11/02/2020   BUN 14 11/02/2020   CREATININE 0.78 11/02/2020   BILITOT 0.4 09/12/2020   ALKPHOS 136 (H) 09/12/2020   AST 11 09/12/2020   ALT 11 09/12/2020   PROT 6.5 09/12/2020   ALBUMIN 4.0 09/12/2020   CALCIUM 9.6 11/02/2020   ANIONGAP 9 08/21/2020   GFR 93.63 07/12/2015   Lab Results  Component Value Date   CHOL 135 09/12/2020   Lab Results  Component Value Date   HDL 62 09/12/2020   Lab Results  Component Value Date   LDLCALC 57 09/12/2020   Lab Results  Component Value Date   TRIG 84 09/12/2020   Lab Results  Component Value Date   CHOLHDL 2.2 09/12/2020   Lab Results  Component Value Date   HGBA1C 7.7 (H) 09/12/2020  Assessment & Plan:   Problem List Items Addressed This Visit   None   Visit Diagnoses    Pain in joint of right shoulder    -  Primary       Meds ordered this encounter  Medications  . DISCONTD: HYDROcodone-acetaminophen (NORCO/VICODIN) 5-325 MG tablet    Sig: Take 1 tablet by mouth 2 (two) times daily as needed for moderate pain.    Dispense:  30 tablet    Refill:  0    Order Specific Question:   Supervising Provider    Answer:   Carlota Raspberry, JEFFREY R [[id number]]   PLAN  Short course of norco given  Reviewed r/b/se of this medication, as well as alternatives  Pt voices understanding  Follow up with orho  Patient encouraged to call clinic with any questions, comments, or  concerns.   Maximiano Coss, NP

## 2020-11-12 DIAGNOSIS — E1142 Type 2 diabetes mellitus with diabetic polyneuropathy: Secondary | ICD-10-CM | POA: Diagnosis not present

## 2020-11-12 DIAGNOSIS — I5032 Chronic diastolic (congestive) heart failure: Secondary | ICD-10-CM | POA: Diagnosis not present

## 2020-11-12 DIAGNOSIS — R296 Repeated falls: Secondary | ICD-10-CM | POA: Diagnosis not present

## 2020-11-12 DIAGNOSIS — E1122 Type 2 diabetes mellitus with diabetic chronic kidney disease: Secondary | ICD-10-CM | POA: Diagnosis not present

## 2020-11-12 DIAGNOSIS — I130 Hypertensive heart and chronic kidney disease with heart failure and stage 1 through stage 4 chronic kidney disease, or unspecified chronic kidney disease: Secondary | ICD-10-CM | POA: Diagnosis not present

## 2020-11-12 DIAGNOSIS — M25511 Pain in right shoulder: Secondary | ICD-10-CM | POA: Diagnosis not present

## 2020-11-12 DIAGNOSIS — N189 Chronic kidney disease, unspecified: Secondary | ICD-10-CM | POA: Diagnosis not present

## 2020-11-12 DIAGNOSIS — I480 Paroxysmal atrial fibrillation: Secondary | ICD-10-CM | POA: Diagnosis not present

## 2020-11-12 DIAGNOSIS — G8911 Acute pain due to trauma: Secondary | ICD-10-CM | POA: Diagnosis not present

## 2020-11-13 DIAGNOSIS — I130 Hypertensive heart and chronic kidney disease with heart failure and stage 1 through stage 4 chronic kidney disease, or unspecified chronic kidney disease: Secondary | ICD-10-CM | POA: Diagnosis not present

## 2020-11-13 DIAGNOSIS — M25511 Pain in right shoulder: Secondary | ICD-10-CM | POA: Diagnosis not present

## 2020-11-13 DIAGNOSIS — N189 Chronic kidney disease, unspecified: Secondary | ICD-10-CM | POA: Diagnosis not present

## 2020-11-13 DIAGNOSIS — R296 Repeated falls: Secondary | ICD-10-CM | POA: Diagnosis not present

## 2020-11-13 DIAGNOSIS — I480 Paroxysmal atrial fibrillation: Secondary | ICD-10-CM | POA: Diagnosis not present

## 2020-11-13 DIAGNOSIS — I5032 Chronic diastolic (congestive) heart failure: Secondary | ICD-10-CM | POA: Diagnosis not present

## 2020-11-13 DIAGNOSIS — E1122 Type 2 diabetes mellitus with diabetic chronic kidney disease: Secondary | ICD-10-CM | POA: Diagnosis not present

## 2020-11-13 DIAGNOSIS — E1142 Type 2 diabetes mellitus with diabetic polyneuropathy: Secondary | ICD-10-CM | POA: Diagnosis not present

## 2020-11-13 DIAGNOSIS — G8911 Acute pain due to trauma: Secondary | ICD-10-CM | POA: Diagnosis not present

## 2020-11-14 DIAGNOSIS — E1122 Type 2 diabetes mellitus with diabetic chronic kidney disease: Secondary | ICD-10-CM | POA: Diagnosis not present

## 2020-11-14 DIAGNOSIS — N189 Chronic kidney disease, unspecified: Secondary | ICD-10-CM | POA: Diagnosis not present

## 2020-11-14 DIAGNOSIS — E1142 Type 2 diabetes mellitus with diabetic polyneuropathy: Secondary | ICD-10-CM | POA: Diagnosis not present

## 2020-11-14 DIAGNOSIS — G8911 Acute pain due to trauma: Secondary | ICD-10-CM | POA: Diagnosis not present

## 2020-11-14 DIAGNOSIS — I130 Hypertensive heart and chronic kidney disease with heart failure and stage 1 through stage 4 chronic kidney disease, or unspecified chronic kidney disease: Secondary | ICD-10-CM | POA: Diagnosis not present

## 2020-11-14 DIAGNOSIS — M25511 Pain in right shoulder: Secondary | ICD-10-CM | POA: Diagnosis not present

## 2020-11-14 DIAGNOSIS — R296 Repeated falls: Secondary | ICD-10-CM | POA: Diagnosis not present

## 2020-11-14 DIAGNOSIS — I5032 Chronic diastolic (congestive) heart failure: Secondary | ICD-10-CM | POA: Diagnosis not present

## 2020-11-14 DIAGNOSIS — I480 Paroxysmal atrial fibrillation: Secondary | ICD-10-CM | POA: Diagnosis not present

## 2020-11-15 ENCOUNTER — Encounter: Payer: Self-pay | Admitting: Emergency Medicine

## 2020-11-15 ENCOUNTER — Other Ambulatory Visit: Payer: Self-pay

## 2020-11-15 ENCOUNTER — Telehealth (INDEPENDENT_AMBULATORY_CARE_PROVIDER_SITE_OTHER): Payer: Medicare Other | Admitting: Emergency Medicine

## 2020-11-15 DIAGNOSIS — R109 Unspecified abdominal pain: Secondary | ICD-10-CM

## 2020-11-15 NOTE — Progress Notes (Signed)
Telemedicine Encounter- SOAP NOTE Established Patient Patient: Home  Provider: Office     This telephone encounter was conducted with the patient's (or proxy's) verbal consent via audio telecommunications: yes/no: Yes Patient was instructed to have this encounter in a suitably private space; and to only have persons present to whom they give permission to participate. In addition, patient identity was confirmed by use of name plus two identifiers (DOB and address).  I discussed the limitations, risks, security and privacy concerns of performing an evaluation and management service by telephone and the availability of in person appointments. I also discussed with the patient that there may be a patient responsible charge related to this service. The patient expressed understanding and agreed to proceed.  I spent a total of TIME; 0 MIN TO 60 MIN: 10 minutes talking with the patient or their proxy.  Chief Complaint  Patient presents with  . STOMACH CRAMPS    Per patient for one and half weeks and pressure when she urinates    Subjective   [name] is a 80 y.o. female established patient. Telephone visit today complaining of intermittent diffuse abdominal cramping that she had about a week and a half ago along with some urinary symptoms.  Much better today.  Was having some suprapubic pressure which is now gone.  Denies dysuria.  Denies hematuria or any other urinary symptoms.  Able to eat and drink.  Denies nausea or vomiting.  Denies diarrhea.  Has been tolerating Ozempic well for the past couple months.  Normal A1c's and glucose at home and has been able to lose some weight.  No other complaints or medical concerns today.  HPI   Patient Active Problem List   Diagnosis Date Noted  . Persistent atrial fibrillation ([place]) 08/12/2020  . Type 2 diabetes mellitus with hyperglycemia, with long-term current use of insulin ([place]) 06/21/2020  . Type 2 diabetes mellitus with diabetic  polyneuropathy, with long-term current use of insulin ([place]) 06/21/2020  . Mixed hyperlipidemia 06/21/2020  . Uncontrolled hypertension 06/11/2020  . Aortic atherosclerosis ([place]) 03/28/2020  . Diabetic peripheral neuropathy ([place]) 12/26/2019  . Gastroesophageal reflux disease without esophagitis 03/17/2019  . Epigastric pain 02/16/2019  . Bilateral leg edema 02/16/2019  . Dysgeusia 06/11/2017  . Acute pain of left knee 06/03/2017  . Allergic dermatitis 06/03/2017  . Obesity 12/19/2015  . Hypersomnia 12/19/2015  . Edema 10/18/2015  . Acquired hypothyroidism 10/18/2015  . Acute diastolic congestive heart failure, NYHA class 3 ([place]) 10/03/2013  . Prolonged QT interval 10/03/2013  . Acute diastolic heart failure ([place]) 10/03/2013  . Long term (current) use of anticoagulants 06/09/2012  . BENIGN NEOPLASM OF ADRENAL GLAND 11/25/2010  . Chronic diastolic heart failure ([name]) 02/20/2010  . DM 05/16/2009  . GOUT 05/16/2009  . OBESITY, MORBID 05/16/2009  . Hypertension associated with diabetes ([place]) 05/16/2009  . Paroxysmal atrial fibrillation ([place]) 05/16/2009  . HYPERLIPIDEMIA 11/30/2008  . Obstructive sleep apnea on CPAP 11/30/2008    Past Medical History:  Diagnosis Date  . Arthritis   . Back pain   . Chronic anticoagulation    due to aflutter  . Chronic kidney disease   . Diabetes mellitus   . Diastolic CHF, chronic ([place])    a.  echo 2006 - ef 55-65%; mild diast dysfxn;    b. Echo 08/2011: Mild LVH, EF 60%;  c. 04/2013 Echo: EF 65-69%, mild conc LVH;  08/2014 Echo: EF 60-65%, mild-mod MR.  . Gout   . Hyperlipidemia   .  Hypertension    a.  Renal arterial Dopplers 12/2011: 1-59% right renal artery stenosis  . Morbid obesity ([place])   . Obstructive sleep apnea on CPAP   . Paroxysmal Afib/Flutter    a. 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    Current Outpatient Medications  Medication Sig Dispense Refill  . Accu-Chek Softclix Lancets lancets 1 each by Other route 3 (three) times  daily. as directed 100 each 3  . allopurinol (ZYLOPRIM) 300 MG tablet Take 300 mg by mouth daily.     Marland Kitchen amiodarone (PACERONE) 200 MG tablet Take 1 tablet (200 mg total) by mouth daily. 90 tablet 3  . apixaban (ELIQUIS) 5 MG TABS tablet Take 1 tablet (5 mg total) by mouth 2 (two) times daily. 180 tablet 1  . blood glucose meter kit and supplies Dispense based on patient and insurance preference. Use up to four times daily as directed. (FOR ICD-10 E10.9, E11.9). 1 each 0  . cloNIDine (CATAPRES) 0.3 MG tablet TAKE 1 TABLET(0.3 MG) BY MOUTH TWICE DAILY 180 tablet 0  . Continuous Blood Gluc Receiver (FREESTYLE LIBRE 14 DAY READER) DEVI Use as directed. 1 each 0  . Continuous Blood Gluc Sensor (FREESTYLE LIBRE 14 DAY SENSOR) MISC Use as directed to check blood sugar daily 2 each 3  . cyclobenzaprine (FLEXERIL) 5 MG tablet     . diltiazem (CARDIZEM) 30 MG tablet Take 30 mg by mouth. Take one tablet every 4 hours as needed for heart rate greater than 100 and blood pressure needs to be above 100    . HYDROcodone-homatropine (HYCODAN) 5-1.5 MG/5ML syrup Take 5 mLs by mouth at bedtime as needed for cough. 120 mL 0  . Insulin Lispro Prot & Lispro (HUMALOG 75/25 MIX) (75-25) 100 UNIT/ML Kwikpen Inject 15-21 Units into the skin with breakfast, with lunch, and with evening meal. Sliding scale insulin    . levothyroxine (SYNTHROID, LEVOTHROID) 75 MCG tablet Take 75 mcg daily before breakfast by mouth.     . lidocaine (LIDODERM) 5 % Place 1 patch onto the skin daily. Remove & Discard patch within 12 hours or as directed by MD 30 patch 0  . loperamide (IMODIUM) 2 MG capsule Take 1 capsule (2 mg total) by mouth 4 (four) times daily as needed for diarrhea or loose stools. 12 capsule 0  . losartan (COZAAR) 100 MG tablet Take 1 tablet (100 mg total) by mouth daily. 90 tablet 3  . NOVOFINE PLUS 32G X 4 MM MISC 1 each by Subdermal route 2 (two) times daily. as directed 60 each 3  . ONETOUCH VERIO test strip 1 each 3  (three) times daily.    . potassium chloride (KLOR-CON) 10 MEQ tablet Take 2 tablets (20 mEq total) by mouth daily. 180 tablet 3  . RESTASIS MULTIDOSE 0.05 % ophthalmic emulsion Place 1 drop into both eyes 2 (two) times daily.     . rosuvastatin (CRESTOR) 10 MG tablet Take 10 mg by mouth every evening.    . Semaglutide,0.25 or 0.5MG/DOS, (OZEMPIC, 0.25 OR 0.5 MG/DOSE,) 2 MG/1.5ML SOPN Inject 0.75 mLs (1 mg total) into the skin once a week. 12 pen 3  . tiZANidine (ZANAFLEX) 4 MG tablet Take 1 tablet (4 mg total) by mouth every 8 (eight) hours as needed for muscle spasms. 60 tablet 1  . torsemide (DEMADEX) 20 MG tablet TAKE 2 TABLETS(40 MG) BY MOUTH DAILY 180 tablet 3  . triamcinolone cream (KENALOG) 0.1 % Apply 1 application topically 2 (two) times daily. 30 g  0  . verapamil (CALAN) 120 MG tablet Take 1 tablet (120 mg total) by mouth 2 (two) times daily. 180 tablet 3   No current facility-administered medications for this visit.    Allergies  Allergen Reactions  . Metformin Diarrhea    Social History   Socioeconomic History  . Marital status: Married    Spouse name: Not on file  . Number of children: 3  . Years of education: Not on file  . Highest education level: Not on file  Occupational History  . Occupation: DISABILITY/housewife    Employer: RETIRED  Tobacco Use  . Smoking status: Never Smoker  . Smokeless tobacco: Never Used  Vaping Use  . Vaping Use: Never used  Substance and Sexual Activity  . Alcohol use: No  . Drug use: No  . Sexual activity: Yes  Other Topics Concern  . Not on file  Social History Narrative  . Not on file   Social Determinants of Health   Financial Resource Strain: Not on file  Food Insecurity: Not on file  Transportation Needs: Not on file  Physical Activity: Not on file  Stress: Not on file  Social Connections: Not on file  Intimate Partner Violence: Not on file    Review of Systems  Constitutional: Negative.  Negative for chills and  fever.  HENT: Negative.  Negative for congestion and sore throat.   Respiratory: Negative.  Negative for cough and shortness of breath.   Cardiovascular: Negative.  Negative for chest pain and palpitations.  Gastrointestinal: Negative.  Negative for abdominal pain, blood in stool, diarrhea, melena, nausea and vomiting.  Genitourinary: Negative.  Negative for dysuria and hematuria.  Skin: Negative.  Negative for rash.  Neurological: Negative.  Negative for dizziness and headaches.  All other systems reviewed and are negative.   Objective  Alert and oriented x3 in no apparent respiratory distress. Vitals as reported by the patient: There were no vitals filed for this visit.  There are no diagnoses linked to this encounter.  Rajanae was seen today for stomach cramps.  Diagnoses and all orders for this visit:  Abdominal cramping    Clinically stable.  No red flag signs or symptoms. Continue present medications.  No changes. Advised to contact the office if no better or worse during the next several days.   I discussed the assessment and treatment plan with the patient. The patient was provided an opportunity to ask questions and all were answered. The patient agreed with the plan and demonstrated an understanding of the instructions.   The patient was advised to call back or seek an in-person evaluation if the symptoms worsen or if the condition fails to improve as anticipated.  I provided 10 minutes of non-face-to-face time during this encounter.  Horald Pollen, MD  Primary Care at [facility]

## 2020-11-15 NOTE — Patient Instructions (Signed)
° ° ° °  If you have lab work done today you will be contacted with your lab results within the next 2 weeks.  If you have not heard from us then please contact us. The fastest way to get your results is to register for My Chart. ° ° °IF you received an x-ray today, you will receive an invoice from [facility]. Please contact  Radiology at [phone] with questions or concerns regarding your invoice.  ° °IF you received labwork today, you will receive an invoice from [facility]. Please contact [facility] at [phone] with questions or concerns regarding your invoice.  ° °Our billing staff will not be able to assist you with questions regarding bills from these companies. ° °You will be contacted with the lab results as soon as they are available. The fastest way to get your results is to activate your My Chart account. Instructions are located on the last page of this paperwork. If you have not heard from us regarding the results in 2 weeks, please contact this office. °  ° ° ° °

## 2020-11-16 DIAGNOSIS — I5032 Chronic diastolic (congestive) heart failure: Secondary | ICD-10-CM | POA: Diagnosis not present

## 2020-11-16 DIAGNOSIS — N189 Chronic kidney disease, unspecified: Secondary | ICD-10-CM | POA: Diagnosis not present

## 2020-11-16 DIAGNOSIS — E1122 Type 2 diabetes mellitus with diabetic chronic kidney disease: Secondary | ICD-10-CM | POA: Diagnosis not present

## 2020-11-16 DIAGNOSIS — R296 Repeated falls: Secondary | ICD-10-CM | POA: Diagnosis not present

## 2020-11-16 DIAGNOSIS — M25511 Pain in right shoulder: Secondary | ICD-10-CM | POA: Diagnosis not present

## 2020-11-16 DIAGNOSIS — I130 Hypertensive heart and chronic kidney disease with heart failure and stage 1 through stage 4 chronic kidney disease, or unspecified chronic kidney disease: Secondary | ICD-10-CM | POA: Diagnosis not present

## 2020-11-16 DIAGNOSIS — E1142 Type 2 diabetes mellitus with diabetic polyneuropathy: Secondary | ICD-10-CM | POA: Diagnosis not present

## 2020-11-16 DIAGNOSIS — G8911 Acute pain due to trauma: Secondary | ICD-10-CM | POA: Diagnosis not present

## 2020-11-16 DIAGNOSIS — I480 Paroxysmal atrial fibrillation: Secondary | ICD-10-CM | POA: Diagnosis not present

## 2020-11-19 DIAGNOSIS — M25511 Pain in right shoulder: Secondary | ICD-10-CM | POA: Diagnosis not present

## 2020-11-19 DIAGNOSIS — E1122 Type 2 diabetes mellitus with diabetic chronic kidney disease: Secondary | ICD-10-CM | POA: Diagnosis not present

## 2020-11-19 DIAGNOSIS — I5032 Chronic diastolic (congestive) heart failure: Secondary | ICD-10-CM | POA: Diagnosis not present

## 2020-11-19 DIAGNOSIS — I130 Hypertensive heart and chronic kidney disease with heart failure and stage 1 through stage 4 chronic kidney disease, or unspecified chronic kidney disease: Secondary | ICD-10-CM | POA: Diagnosis not present

## 2020-11-19 DIAGNOSIS — G8911 Acute pain due to trauma: Secondary | ICD-10-CM | POA: Diagnosis not present

## 2020-11-19 DIAGNOSIS — R296 Repeated falls: Secondary | ICD-10-CM | POA: Diagnosis not present

## 2020-11-19 DIAGNOSIS — E1142 Type 2 diabetes mellitus with diabetic polyneuropathy: Secondary | ICD-10-CM | POA: Diagnosis not present

## 2020-11-19 DIAGNOSIS — I480 Paroxysmal atrial fibrillation: Secondary | ICD-10-CM | POA: Diagnosis not present

## 2020-11-19 DIAGNOSIS — N189 Chronic kidney disease, unspecified: Secondary | ICD-10-CM | POA: Diagnosis not present

## 2020-11-26 DIAGNOSIS — M25511 Pain in right shoulder: Secondary | ICD-10-CM | POA: Diagnosis not present

## 2020-11-26 DIAGNOSIS — E1142 Type 2 diabetes mellitus with diabetic polyneuropathy: Secondary | ICD-10-CM | POA: Diagnosis not present

## 2020-11-26 DIAGNOSIS — N189 Chronic kidney disease, unspecified: Secondary | ICD-10-CM | POA: Diagnosis not present

## 2020-11-26 DIAGNOSIS — I130 Hypertensive heart and chronic kidney disease with heart failure and stage 1 through stage 4 chronic kidney disease, or unspecified chronic kidney disease: Secondary | ICD-10-CM | POA: Diagnosis not present

## 2020-11-26 DIAGNOSIS — G8911 Acute pain due to trauma: Secondary | ICD-10-CM | POA: Diagnosis not present

## 2020-11-26 DIAGNOSIS — I480 Paroxysmal atrial fibrillation: Secondary | ICD-10-CM | POA: Diagnosis not present

## 2020-11-26 DIAGNOSIS — I5032 Chronic diastolic (congestive) heart failure: Secondary | ICD-10-CM | POA: Diagnosis not present

## 2020-11-26 DIAGNOSIS — R296 Repeated falls: Secondary | ICD-10-CM | POA: Diagnosis not present

## 2020-11-26 DIAGNOSIS — E1122 Type 2 diabetes mellitus with diabetic chronic kidney disease: Secondary | ICD-10-CM | POA: Diagnosis not present

## 2020-11-29 ENCOUNTER — Ambulatory Visit (INDEPENDENT_AMBULATORY_CARE_PROVIDER_SITE_OTHER): Payer: Medicare Other | Admitting: Emergency Medicine

## 2020-11-29 ENCOUNTER — Other Ambulatory Visit: Payer: Self-pay

## 2020-11-29 ENCOUNTER — Encounter: Payer: Self-pay | Admitting: Emergency Medicine

## 2020-11-29 VITALS — BP 131/77 | HR 67 | Temp 98.0°F | Resp 16 | Ht 61.0 in | Wt 253.0 lb

## 2020-11-29 DIAGNOSIS — I482 Chronic atrial fibrillation, unspecified: Secondary | ICD-10-CM | POA: Diagnosis not present

## 2020-11-29 DIAGNOSIS — E1142 Type 2 diabetes mellitus with diabetic polyneuropathy: Secondary | ICD-10-CM

## 2020-11-29 DIAGNOSIS — E039 Hypothyroidism, unspecified: Secondary | ICD-10-CM | POA: Diagnosis not present

## 2020-11-29 DIAGNOSIS — D6859 Other primary thrombophilia: Secondary | ICD-10-CM | POA: Diagnosis not present

## 2020-11-29 DIAGNOSIS — I5032 Chronic diastolic (congestive) heart failure: Secondary | ICD-10-CM

## 2020-11-29 DIAGNOSIS — I700 Atherosclerosis of aorta: Secondary | ICD-10-CM

## 2020-11-29 DIAGNOSIS — E1159 Type 2 diabetes mellitus with other circulatory complications: Secondary | ICD-10-CM

## 2020-11-29 DIAGNOSIS — I152 Hypertension secondary to endocrine disorders: Secondary | ICD-10-CM

## 2020-11-29 DIAGNOSIS — I110 Hypertensive heart disease with heart failure: Secondary | ICD-10-CM

## 2020-11-29 DIAGNOSIS — Z9989 Dependence on other enabling machines and devices: Secondary | ICD-10-CM

## 2020-11-29 DIAGNOSIS — E1169 Type 2 diabetes mellitus with other specified complication: Secondary | ICD-10-CM

## 2020-11-29 DIAGNOSIS — G4733 Obstructive sleep apnea (adult) (pediatric): Secondary | ICD-10-CM

## 2020-11-29 DIAGNOSIS — E669 Obesity, unspecified: Secondary | ICD-10-CM | POA: Diagnosis not present

## 2020-11-29 DIAGNOSIS — I4891 Unspecified atrial fibrillation: Secondary | ICD-10-CM

## 2020-11-29 DIAGNOSIS — Z7901 Long term (current) use of anticoagulants: Secondary | ICD-10-CM

## 2020-11-29 LAB — POCT GLYCOSYLATED HEMOGLOBIN (HGB A1C): Hemoglobin A1C: 7.4 % — AB (ref 4.0–5.6)

## 2020-11-29 LAB — GLUCOSE, POCT (MANUAL RESULT ENTRY): POC Glucose: 171 mg/dl — AB (ref 70–99)

## 2020-11-29 NOTE — Patient Instructions (Addendum)
If you have lab work done today you will be contacted with your lab results within the next 2 weeks.  If you have not heard from [place] then please contact us. The fastest way to get your results is to register for My Chart.   IF you received an x-ray today, you will receive an invoice from [facility]. Please contact [facility] at [phone] with questions or concerns regarding your invoice.   IF you received labwork today, you will receive an invoice from [place]. Please contact [facility] at [phone] with questions or concerns regarding your invoice.   Our billing staff will not be able to assist you with questions regarding bills from these companies.  You will be contacted with the lab results as soon as they are available. The fastest way to get your results is to activate your My Chart account. Instructions are located on the last page of this paperwork. If you have not heard from [place] regarding the results in 2 weeks, please contact this office.     Mantenimiento de la salud despus de los 78 aos de edad Health Maintenance After Age 17 Despus de los 65 aos de edad, corre un riesgo mayor de Tourist information centre manager enfermedades e infecciones a Barrister's clerk, como tambin de sufrir lesiones por cadas. Las cadas son la causa principal de las fracturas de huesos y lesiones en la cabeza de personas mayores de 78 aos de edad. Recibir cuidados preventivos de forma regular puede ayudarlo a mantenerse saludable y en buen [place]. Los cuidados preventivos incluyen realizarse anlisis de forma regular y Actor en el estilo de vida segn las recomendaciones del mdico. Converse con el profesional que lo asiste sobre:  Las pruebas de deteccin y los anlisis que debe Dispensing optician. Una prueba de deteccin es un estudio que se para Hydrographic surveyor la presencia de una enfermedad cuando no tiene sntomas.  Un plan de dieta y ejercicios adecuado para usted. Qu debo saber sobre las  pruebas de deteccin y los anlisis para prevenir cadas? Realizarse pruebas de deteccin y [employer] es la mejor manera de Hydrographic surveyor un problema de salud de forma temprana. El diagnstico y tratamiento tempranos le brindan la mejor oportunidad de Chief Technology Officer las afecciones mdicas que son comunes despus de los 78 aos de edad. Ciertas afecciones y elecciones de estilo de vida pueden hacer que sea ms propenso a sufrir Engineer, manufacturing. El mdico puede recomendarle lo siguiente:  Controles regulares de la visin. Una visin deficiente y afecciones como las cataratas pueden hacer que sea ms propenso a sufrir Engineer, manufacturing. Si [place] lentes, asegrese de obtener una receta actualizada si su visin cambia.  Revisin de medicamentos. Revise regularmente con el mdico todos los medicamentos que toma, incluidos los medicamentos de [place]. Consulte al [employer] efectos secundarios que pueden hacer que sea ms propenso a sufrir Engineer, manufacturing. Informe al mdico si alguno de los medicamentos que toma lo hace sentir mareado o somnoliento.  Pruebas de deteccin para la osteoporosis. La osteoporosis es una afeccin que hace que los huesos se vuelvan ms frgiles. En consecuencia, los huesos pueden debilitarse y quebrarse ms fcilmente.  Pruebas de deteccin para la presin arterial. Los cambios en la presin arterial y los medicamentos para Chief Technology Officer la presin arterial pueden hacerlo sentir mareado.  Controles de fuerza y equilibrio. El mdico puede recomendar ciertos estudios para controlar su fuerza y equilibrio al estar de pie, al caminar o al cambiar de posicin.  Examen de los pies.  El dolor y Chiropractor en los pies, como tambin no utilizar el calzado [place], pueden hacer que sea ms propenso a sufrir Engineer, manufacturing.  Prueba de deteccin de la depresin. Es ms probable que sufra una cada si tiene miedo a caerse, se siente mal emocionalmente o se siente incapaz de Stage manager.  Prueba de deteccin de consumo de alcohol. Beber demasiado alcohol puede afectar su equilibrio y puede hacer que sea ms propenso a sufrir Engineer, manufacturing. Qu medidas puedo tomar para reducir mi riesgo de sufrir una cada? Instrucciones generales  Hable con el mdico sobre sus riesgos de sufrir una cada. Infrmele a su mdico si: ? Se cae. Asegrese de informarle a su mdico acerca de todas las cadas, incluso aquellas que parecen ser [employer] ? Se siente mareado, somnoliento o que pierde el equilibrio.  Tome los medicamentos de venta libre y los recetados solamente como se lo haya indicado el mdico. Estos incluyen todos los suplementos.  Siga una dieta sana y [place] un peso saludable. Una dieta saludable incluye productos lcteos descremados, carnes bajas en contenido de grasa ([place], [place] de granos enteros, frijoles y [place] frutas y verduras. La seguridad en el hogar  Retire los objetos que puedan causar tropiezos tales como alfombras, cables u obstculos.  Instale equipos de seguridad, como barras para sostn en los baos y barandas de seguridad en las escaleras.  El Negro habitaciones y los pasillos bien iluminados. Actividad  Siga un programa de ejercicio regular para mantenerse en forma. Esto lo ayudar a Contractor equilibrio. Consulte al mdico qu tipos de ejercicios son adecuados para usted.  Si necesita un bastn o un andador, selo segn las recomendaciones del mdico.  Utilice calzado con buen apoyo y suela antideslizante.   Estilo de vida  No beba alcohol si el mdico le indica que no beba.  Si bebe alcohol, limite la cantidad que consume: ? De 0 a 1 medida por da para las mujeres. ? De 0 a 2 medidas por da para los hombres.  Est atento a la cantidad de alcohol que contiene su bebida. En los [place] una medida equivale a una botella tpica de cerveza (12 onzas), media copa de vino (5 onzas) o una medida de bebida blanca (1 onza).  No consuma  ningn producto que contenga nicotina o tabaco, como cigarrillos y Psychologist, sport and exercise. Si necesita ayuda para dejar de fumar, consulte al mdico. Resumen  Tener un estilo de vida saludable y recibir cuidados preventivos pueden ayudar a Theatre stage manager salud y el bienestar despus de los 80 aos de [place].  Realizarse pruebas de deteccin y [employer] es la mejor manera de Hydrographic surveyor un problema de salud de forma temprana y Lourena Simmonds a Product/process development scientist una cada. El diagnstico y tratamiento tempranos le brindan la mejor oportunidad de Chief Technology Officer las afecciones mdicas ms comunes en las personas mayores de 82 aos de edad.  Las cadas son la causa principal de las fracturas de huesos y lesiones en la cabeza de personas mayores de 60 aos de edad. Tome precauciones para evitar una cada en su casa.  Trabaje con el mdico para saber qu cambios que puede hacer para mejorar su salud y [place], y [place]. Esta informacin no tiene Marine scientist el consejo del mdico. Asegrese de hacerle al mdico cualquier pregunta que tenga. Document Revised: 11/19/2017 Document Reviewed: 11/19/2017 Elsevier Patient Education  2021 [employer].

## 2020-11-29 NOTE — Progress Notes (Signed)
[name] 80 y.o.   Chief Complaint  Patient presents with  . chronic medical condition    Per patient follow up and feeling better    HISTORY OF PRESENT ILLNESS: This is a 80 y.o. female here for evaluation of chronic medical problems.  Stable.  Doing well.  Has no complaints or medical concerns today 1.  Diabetes: Sees endocrinologist on a regular basis.  On Ozempic and insulin. 2.  Chronic atrial fibrillation: On amiodarone and long-term Eliquis 3.  Chronic diastolic heart failure.  Sees cardiologist on a regular basis. 4.  Dyslipidemia 5.  History of sleep apnea on CPAP.  Requesting new evaluation and sleep apnea study. 6.  Hypothyroidism, on Synthroid 75 mcg 7.  History of GERD 8.  History of gout Recent home visiting nurse evaluation noted stable conditions and hemoglobin A1c at 6.5.  Report reviewed by me earlier this week. Lab Results  Component Value Date   HGBA1C 7.7 (H) 09/12/2020  No other complaints or medical concerns today.  HPI   Prior to Admission medications   Medication Sig Start Date End Date Taking? Authorizing Provider  allopurinol (ZYLOPRIM) 300 MG tablet Take 300 mg by mouth daily.  07/07/13  Yes [provider]  amiodarone (PACERONE) 200 MG tablet Take 1 tablet (200 mg total) by mouth daily. 07/18/20  Yes Sherran Needs, NP  apixaban (ELIQUIS) 5 MG TABS tablet Take 1 tablet (5 mg total) by mouth 2 (two) times daily. 06/14/20  Yes [name], MD  blood glucose meter kit and supplies Dispense based on patient and insurance preference. Use up to four times daily as directed. (FOR ICD-10 E10.9, E11.9). 05/03/20  Yes Wacey Zieger, Ines Bloomer, MD  cloNIDine (CATAPRES) 0.3 MG tablet TAKE 1 TABLET(0.3 MG) BY MOUTH TWICE DAILY 09/30/20  Yes Daily Crate, Ines Bloomer, MD  cyclobenzaprine (FLEXERIL) 5 MG tablet  07/08/20  Yes [provider]  diltiazem (CARDIZEM) 30 MG tablet Take 30 mg by mouth. Take one tablet every 4 hours as needed for heart  rate greater than 100 and blood pressure needs to be above 100   Yes Sherran Needs, NP  Insulin Lispro Prot & Lispro (HUMALOG 75/25 MIX) (75-25) 100 UNIT/ML Kwikpen Inject 15-21 Units into the skin with breakfast, with lunch, and with evening meal. Sliding scale insulin 08/12/16  Yes [provider]  levothyroxine (SYNTHROID, LEVOTHROID) 75 MCG tablet Take 75 mcg daily before breakfast by mouth.  12/29/16  Yes [provider]  lidocaine (LIDODERM) 5 % Place 1 patch onto the skin daily. Remove & Discard patch within 12 hours or as directed by MD 08/29/20  Yes Mcarthur Rossetti, MD  loperamide (IMODIUM) 2 MG capsule Take 1 capsule (2 mg total) by mouth 4 (four) times daily as needed for diarrhea or loose stools. 03/20/20  Yes Garald Balding, PA-C  losartan (COZAAR) 100 MG tablet Take 1 tablet (100 mg total) by mouth daily. 05/31/20  Yes Chelisa Hennen, Ines Bloomer, MD  potassium chloride (KLOR-CON) 10 MEQ tablet Take 2 tablets (20 mEq total) by mouth daily. 10/18/19  Yes [name], MD  RESTASIS MULTIDOSE 0.05 % ophthalmic emulsion Place 1 drop into both eyes 2 (two) times daily.  03/04/20  Yes [provider]  rosuvastatin (CRESTOR) 10 MG tablet Take 10 mg by mouth every evening.   Yes [provider]  Semaglutide,0.25 or 0.5MG/DOS, (OZEMPIC, 0.25 OR 0.5 MG/DOSE,) 2 MG/1.5ML SOPN Inject 0.75 mLs (1 mg total) into the skin once a  week. 04/18/20  Yes Horald Pollen, MD  torsemide (DEMADEX) 20 MG tablet TAKE 2 TABLETS(40 MG) BY MOUTH DAILY 11/07/20  Yes [name], MD  triamcinolone cream (KENALOG) 0.1 % Apply 1 application topically 2 (two) times daily. 07/26/20  Yes Suzana Sohail, Ines Bloomer, MD  verapamil (CALAN) 120 MG tablet Take 1 tablet (120 mg total) by mouth 2 (two) times daily. 08/15/20  Yes [name], MD  Accu-Chek Softclix Lancets lancets 1 each by Other route 3 (three) times daily. as directed 03/30/20   Maximiano Coss, NP  Continuous Blood  Gluc Receiver (FREESTYLE LIBRE 14 DAY READER) DEVI Use as directed. 04/18/20   Horald Pollen, MD  Continuous Blood Gluc Sensor (FREESTYLE LIBRE 14 DAY SENSOR) MISC Use as directed to check blood sugar daily 09/24/20   [name], Melanie Crazier, MD  NOVOFINE PLUS 32G X 4 MM MISC 1 each by Subdermal route 2 (two) times daily. as directed 03/30/20   Maximiano Coss, NP  [facility] VERIO test strip 1 each 3 (three) times daily. 06/08/20   [provider]  tiZANidine (ZANAFLEX) 4 MG tablet Take 1 tablet (4 mg total) by mouth every 8 (eight) hours as needed for muscle spasms. Patient not taking: Reported on 11/29/2020 08/29/20   Mcarthur Rossetti, MD    Allergies  Allergen Reactions  . Metformin Diarrhea    Patient Active Problem List   Diagnosis Date Noted  . Persistent atrial fibrillation ([place]) 08/12/2020  . Type 2 diabetes mellitus with hyperglycemia, with long-term current use of insulin ([place]) 06/21/2020  . Type 2 diabetes mellitus with diabetic polyneuropathy, with long-term current use of insulin ([place]) 06/21/2020  . Mixed hyperlipidemia 06/21/2020  . Uncontrolled hypertension 06/11/2020  . Aortic atherosclerosis ([place]) 03/28/2020  . Diabetic peripheral neuropathy ([place]) 12/26/2019  . Gastroesophageal reflux disease without esophagitis 03/17/2019  . Bilateral leg edema 02/16/2019  . Dysgeusia 06/11/2017  . Allergic dermatitis 06/03/2017  . Obesity 12/19/2015  . Hypersomnia 12/19/2015  . Edema 10/18/2015  . Acquired hypothyroidism 10/18/2015  . Acute diastolic congestive heart failure, NYHA class 3 ([place]) 10/03/2013  . Prolonged QT interval 10/03/2013  . Acute diastolic heart failure ([place]) 10/03/2013  . Long term (current) use of anticoagulants 06/09/2012  . BENIGN NEOPLASM OF ADRENAL GLAND 11/25/2010  . Chronic diastolic heart failure ([place]) 02/20/2010  . DM 05/16/2009  . GOUT 05/16/2009  . OBESITY, MORBID 05/16/2009  . Hypertension associated with diabetes ([place])  05/16/2009  . Paroxysmal atrial fibrillation ([place]) 05/16/2009  . HYPERLIPIDEMIA 11/30/2008  . Obstructive sleep apnea on CPAP 11/30/2008    Past Medical History:  Diagnosis Date  . Arthritis   . Back pain   . Chronic anticoagulation    due to aflutter  . Chronic kidney disease   . Diabetes mellitus   . Diastolic CHF, chronic ([place])    a.  echo 2006 - ef 55-65%; mild diast dysfxn;    b. Echo 08/2011: Mild LVH, EF 60%;  c. 04/2013 Echo: EF 65-69%, mild conc LVH;  08/2014 Echo: EF 60-65%, mild-mod MR.  . Gout   . Hyperlipidemia   . Hypertension    a.  Renal arterial Dopplers 12/2011: 1-59% right renal artery stenosis  . Morbid obesity ([place])   . Obstructive sleep apnea on CPAP   . Paroxysmal Afib/Flutter    a. 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 APPENDECTOMY    . ATRIAL FLUTTER ABLATION N/A 09/24/2011  Procedure: ATRIAL FLUTTER ABLATION;  Surgeon: [name], MD;  Location: [facility] CATH LAB;  Service: Cardiovascular;  Laterality: N/A;  . CARDIOVERSION  10/22/2011   Procedure: CARDIOVERSION;  Surgeon: [name], MD;  Location: [place];  Service: Cardiovascular;  Laterality: N/A;  . CARDIOVERSION N/A 09/10/2011   Procedure: CARDIOVERSION;  Surgeon: [name], MD;  Location: [facility] CATH LAB;  Service: Cardiovascular;  Laterality: N/A;  . CHOLECYSTECTOMY    . TONSILLECTOMY  1982  . TOTAL ABDOMINAL HYSTERECTOMY      Social History   Socioeconomic History  . Marital status: Married    Spouse name: Not on file  . Number of children: 3  . Years of education: Not on file  . Highest education level: Not on file  Occupational History  . Occupation: DISABILITY/housewife    Employer: RETIRED  Tobacco Use  . Smoking status: Never Smoker  . Smokeless tobacco: Never Used  Vaping Use  . Vaping Use: Never used  Substance and Sexual Activity  . Alcohol use: No  . Drug use: No  . Sexual activity: Yes  Other Topics Concern   . Not on file  Social History Narrative  . Not on file   Social Determinants of Health   Financial Resource Strain: Not on file  Food Insecurity: Not on file  Transportation Needs: Not on file  Physical Activity: Not on file  Stress: Not on file  Social Connections: Not on file  Intimate Partner Violence: Not on file    Family History  Problem Relation Age of Onset  . Heart disease Father   . Hypertension Father   . Breast cancer Sister   . Cancer Sister        breast     Review of Systems  Constitutional: Negative.  Negative for chills and fever.  HENT: Negative.  Negative for congestion and sore throat.   Respiratory: Negative.  Negative for cough and shortness of breath.   Cardiovascular: Positive for palpitations. Negative for chest pain.  Gastrointestinal: Negative.  Negative for abdominal pain, blood in stool, diarrhea, melena, nausea and vomiting.  Genitourinary: Negative.  Negative for dysuria and hematuria.  Skin: Negative.  Negative for rash.  Neurological: Negative.  Negative for dizziness and headaches.     Today's Vitals   11/29/20 1004  BP: 131/77  Pulse: 67  Resp: 16  Temp: 98 F (36.7 C)  TempSrc: Temporal  SpO2: 98%  Weight: 253 lb (114.8 kg)  Height: '5\' 1"'  (1.549 m)   Body mass index is 47.8 kg/m. Wt Readings from Last 3 Encounters:  11/29/20 253 lb (114.8 kg)  11/02/20 253 lb (114.8 kg)  10/29/20 254 lb (115.2 kg)     Physical Exam Constitutional:      Appearance: She is obese.  HENT:     Head: Normocephalic.  Eyes:     Extraocular Movements: Extraocular movements intact.     Pupils: Pupils are equal, round, and reactive to light.  Cardiovascular:     Rate and Rhythm: Normal rate and regular rhythm.     Pulses: Normal pulses.     Heart sounds: Normal heart sounds.  Pulmonary:     Effort: Pulmonary effort is normal.     Breath sounds: Normal breath sounds.  Musculoskeletal:     Cervical back: Normal range of motion and neck  supple.  Skin:    General: Skin is warm and dry.     Capillary Refill: Capillary refill takes less than 2 seconds.  Neurological:     General: No focal deficit present.     Mental Status: She is alert and oriented to person, place, and time.  Psychiatric:        Mood and Affect: Mood normal.        Behavior: Behavior normal.    Results for orders placed or performed in visit on 11/29/20 (from the past 24 hour(s))  POCT glucose (manual entry)     Status: Abnormal   Collection Time: 11/29/20 10:28 AM  Result Value Ref Range   POC Glucose 171 (A) 70 - 99 mg/dl  POCT glycosylated hemoglobin (Hb A1C)     Status: Abnormal   Collection Time: 11/29/20 10:33 AM  Result Value Ref Range   Hemoglobin A1C 7.4 (A) 4.0 - 5.6 %   HbA1c POC (<> result, manual entry)     HbA1c, POC (prediabetic range)     HbA1c, POC (controlled diabetic range)     A total of 30 minutes was spent with the patient, greater than 50% of which was in counseling/coordination of care regarding multiple chronic medical problems and their management, review of all medications, health maintenance items, education on nutrition, review of most recent blood work results, review of most recent office visit notes, prognosis, documentation, and need for follow-up.   ASSESSMENT & PLAN: Clinically stable.  No medical concerns identified during this visit. Continue present medications.  No changes. Follow-up in 3 months. Anjenette was seen today for chronic medical condition.  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  If you have not heard from [place] then please contact us. The fastest way to get your results is to register for My Chart.   IF you received an x-ray today, you will receive an invoice from [facility]. Please contact [facility] at [phone] with questions or concerns regarding your invoice.   IF you received labwork today, you will receive an invoice from [facility]. Please contact [facility] at [phone] with questions or concerns regarding your invoice.   Our billing staff will not be able to assist you with questions regarding bills from these companies.  You will be contacted with the lab results as soon as they are available. The fastest way to get your results is to activate your My Chart account. Instructions are located on the last page of this paperwork. If you have not heard from [place] regarding the results in 2 weeks, please contact this office.     Mantenimiento de la salud despus de los 31 aos de edad Health Maintenance After Age 18 Despus de los 65 aos de edad, corre un riesgo mayor de Tourist information centre manager enfermedades e infecciones a Barrister's clerk, como tambin de sufrir lesiones por cadas. Las cadas son  la causa principal de las fracturas de huesos y lesiones en la cabeza de personas mayores de 55 aos de [place]. Recibir cuidados preventivos de forma regular puede ayudarlo a mantenerse saludable y en buen [place]. Los cuidados preventivos incluyen realizarse anlisis de forma regular y Actor en el estilo de vida segn las recomendaciones del mdico. Converse con el profesional que lo asiste  sobre:  Las pruebas de deteccin y los anlisis que debe Dispensing optician. Una prueba de deteccin es un estudio que se para Hydrographic surveyor la presencia de una enfermedad cuando no tiene sntomas.  Un plan de dieta y ejercicios adecuado para usted. Qu debo saber sobre las pruebas de deteccin y los anlisis para prevenir cadas? Realizarse pruebas de deteccin y [employer] es la mejor manera de Hydrographic surveyor un problema de salud de forma temprana. El diagnstico y tratamiento tempranos le brindan la mejor oportunidad de Chief Technology Officer las afecciones mdicas que son comunes despus de los 49 aos de edad. Ciertas afecciones y elecciones de estilo de vida pueden hacer que sea ms propenso a sufrir Engineer, manufacturing. El mdico puede recomendarle lo siguiente:  Controles regulares de la visin. Una visin deficiente y afecciones como las cataratas pueden hacer que sea ms propenso a sufrir Engineer, manufacturing. Si [place] lentes, asegrese de obtener una receta actualizada si su visin cambia.  Revisin de medicamentos. Revise regularmente con el mdico todos los medicamentos que toma, incluidos los medicamentos de [place]. Consulte al [employer] efectos secundarios que pueden hacer que sea ms propenso a sufrir Engineer, manufacturing. Informe al mdico si alguno de los medicamentos que toma lo hace sentir mareado o somnoliento.  Pruebas de deteccin para la osteoporosis. La osteoporosis es una afeccin que hace que los huesos se vuelvan ms frgiles. En consecuencia, los huesos pueden debilitarse y quebrarse ms fcilmente.  Pruebas de deteccin para la presin arterial. Los cambios en la presin arterial y los medicamentos para Chief Technology Officer la presin arterial pueden hacerlo sentir mareado.  Controles de fuerza y equilibrio. El mdico puede recomendar ciertos estudios para controlar su fuerza y equilibrio al estar de pie, al caminar o al cambiar de posicin.  Examen de los pies. El dolor y Chiropractor en los pies, como tambin no utilizar el  calzado [place], pueden hacer que sea ms propenso a sufrir Engineer, manufacturing.  Prueba de deteccin de la depresin. Es ms probable que sufra una cada si tiene miedo a caerse, se siente mal emocionalmente o se siente incapaz de Patent examiner.  Prueba de deteccin de consumo de alcohol. Beber demasiado alcohol puede afectar su equilibrio y puede hacer que sea ms propenso a sufrir Engineer, manufacturing. Qu medidas puedo tomar para reducir mi riesgo de sufrir una cada? Instrucciones generales  Hable con el mdico sobre sus riesgos de sufrir una cada. Infrmele a su mdico si: ? Se cae. Asegrese de informarle a su mdico acerca de todas las cadas, incluso aquellas que parecen ser [employer] ? Se siente mareado, somnoliento o que pierde el equilibrio.  Tome los medicamentos de venta libre y los recetados solamente como se lo haya indicado el mdico. Estos incluyen todos los suplementos.  Siga una dieta sana y Reader un peso saludable. Una dieta saludable incluye productos lcteos descremados, carnes bajas en contenido de grasa ([place], [place] de granos enteros, frijoles y [place] frutas y verduras. La seguridad en el hogar  Retire los objetos que puedan causar tropiezos tales como alfombras, cables u obstculos.  Instale equipos de seguridad, como  barras para sostn en los baos y barandas de seguridad en las escaleras.  [place] habitaciones y los pasillos bien iluminados. Actividad  Siga un programa de ejercicio regular para mantenerse en forma. Esto lo ayudar a Contractor equilibrio. Consulte al mdico qu tipos de ejercicios son adecuados para usted.  Si necesita un bastn o un andador, selo segn las recomendaciones del mdico.  Utilice calzado con buen apoyo y suela antideslizante.   Estilo de vida  No beba alcohol si el mdico le indica que no beba.  Si bebe alcohol, limite la cantidad que consume: ? De 0 a 1 medida por da para las mujeres. ? De 0 a 2 medidas  por da para los hombres.  Est atento a la cantidad de alcohol que contiene su bebida. En los [place] una medida equivale a una botella tpica de cerveza (12 onzas), media copa de vino (5 onzas) o una medida de bebida blanca (1 onza).  No consuma ningn producto que contenga nicotina o tabaco, como cigarrillos y Psychologist, sport and exercise. Si necesita ayuda para dejar de fumar, consulte al mdico. Resumen  Tener un estilo de vida saludable y recibir cuidados preventivos pueden ayudar a Theatre stage manager salud y el bienestar despus de los 44 aos de [place].  Realizarse pruebas de deteccin y [employer] es la mejor manera de Hydrographic surveyor un problema de salud de forma temprana y Lourena Simmonds a Product/process development scientist una cada. El diagnstico y tratamiento tempranos le brindan la mejor oportunidad de Chief Technology Officer las afecciones mdicas ms comunes en las personas mayores de 7 aos de edad.  Las cadas son la causa principal de las fracturas de huesos y lesiones en la cabeza de personas mayores de 23 aos de edad. Tome precauciones para evitar una cada en su casa.  Trabaje con el mdico para saber qu cambios que puede hacer para mejorar su salud y [place], y [place]. Esta informacin no tiene Marine scientist el consejo del mdico. Asegrese de hacerle al mdico cualquier pregunta que tenga. Document Revised: 11/19/2017 Document Reviewed: 11/19/2017 Elsevier Patient Education  2021 Elsevier Inc.      Agustina Caroli, MD [facility]

## 2020-11-30 LAB — COMPREHENSIVE METABOLIC PANEL
ALT: 10 IU/L (ref 0–32)
AST: 12 IU/L (ref 0–40)
Albumin/Globulin Ratio: 1.5 (ref 1.2–2.2)
Albumin: 4 g/dL (ref 3.7–4.7)
Alkaline Phosphatase: 121 IU/L (ref 44–121)
BUN/Creatinine Ratio: 15 (ref 12–28)
BUN: 11 mg/dL (ref 8–27)
Bilirubin Total: 0.4 mg/dL (ref 0.0–1.2)
CO2: 29 mmol/L (ref 20–29)
Calcium: 8.7 mg/dL (ref 8.7–10.3)
Chloride: 97 mmol/L (ref 96–106)
Creatinine, Ser: 0.74 mg/dL (ref 0.57–1.00)
GFR calc Af Amer: 90 mL/min/{1.73_m2} (ref >59)
GFR calc non Af Amer: 78 mL/min/{1.73_m2} (ref >59)
Globulin, Total: 2.7 g/dL (ref 1.5–4.5)
Glucose: 162 mg/dL — ABNORMAL HIGH (ref 65–99)
Potassium: 3.3 mmol/L — ABNORMAL LOW (ref 3.5–5.2)
Sodium: 142 mmol/L (ref 134–144)
Total Protein: 6.7 g/dL (ref 6.0–8.5)

## 2020-12-02 ENCOUNTER — Other Ambulatory Visit: Payer: Self-pay | Admitting: Emergency Medicine

## 2020-12-04 ENCOUNTER — Telehealth: Payer: Self-pay | Admitting: Emergency Medicine

## 2020-12-04 NOTE — Telephone Encounter (Signed)
Pt would like Sagardia to know she feels terrible. She has reflux at night and very gassy. I tried to make her an appointment to be seen, she wanted a message put in.  She would also like more information about her getting a mammogram. She isn't sure if she is suppose to get a referral or should she call for an appointment.  She also is still snoring even with her C-PAP machine, she is wanting a sleep apena test. Please advise at [phone].

## 2020-12-05 DIAGNOSIS — I480 Paroxysmal atrial fibrillation: Secondary | ICD-10-CM | POA: Diagnosis not present

## 2020-12-05 DIAGNOSIS — G8911 Acute pain due to trauma: Secondary | ICD-10-CM | POA: Diagnosis not present

## 2020-12-05 DIAGNOSIS — I5032 Chronic diastolic (congestive) heart failure: Secondary | ICD-10-CM | POA: Diagnosis not present

## 2020-12-05 DIAGNOSIS — E1122 Type 2 diabetes mellitus with diabetic chronic kidney disease: Secondary | ICD-10-CM | POA: Diagnosis not present

## 2020-12-05 DIAGNOSIS — E1142 Type 2 diabetes mellitus with diabetic polyneuropathy: Secondary | ICD-10-CM | POA: Diagnosis not present

## 2020-12-05 DIAGNOSIS — R296 Repeated falls: Secondary | ICD-10-CM | POA: Diagnosis not present

## 2020-12-05 DIAGNOSIS — N189 Chronic kidney disease, unspecified: Secondary | ICD-10-CM | POA: Diagnosis not present

## 2020-12-05 DIAGNOSIS — I130 Hypertensive heart and chronic kidney disease with heart failure and stage 1 through stage 4 chronic kidney disease, or unspecified chronic kidney disease: Secondary | ICD-10-CM | POA: Diagnosis not present

## 2020-12-05 DIAGNOSIS — M25511 Pain in right shoulder: Secondary | ICD-10-CM | POA: Diagnosis not present

## 2020-12-06 NOTE — Telephone Encounter (Signed)
Spoke with patient. Cpap ordered over 10 years ago. Patient not in communication with that provider. Advised patient to discuss all of her concerns with the provider next week including how to follow up on the Cpap.

## 2020-12-06 NOTE — Telephone Encounter (Signed)
Patient already has a follow up appointment with the provider on 2/23.

## 2020-12-06 NOTE — Telephone Encounter (Signed)
Too many concerns needs another appointment. These cannot be addressed in a message unfortunately   Should speak with provider who gave her Cpap if she is still snoring as that is the function of the CPAP stop snoring and increase oxygenation while sleeping.

## 2020-12-10 ENCOUNTER — Ambulatory Visit: Payer: Medicare Other | Admitting: Orthopaedic Surgery

## 2020-12-10 DIAGNOSIS — N189 Chronic kidney disease, unspecified: Secondary | ICD-10-CM | POA: Diagnosis not present

## 2020-12-10 DIAGNOSIS — E1142 Type 2 diabetes mellitus with diabetic polyneuropathy: Secondary | ICD-10-CM | POA: Diagnosis not present

## 2020-12-10 DIAGNOSIS — G8911 Acute pain due to trauma: Secondary | ICD-10-CM | POA: Diagnosis not present

## 2020-12-10 DIAGNOSIS — I480 Paroxysmal atrial fibrillation: Secondary | ICD-10-CM | POA: Diagnosis not present

## 2020-12-10 DIAGNOSIS — I5032 Chronic diastolic (congestive) heart failure: Secondary | ICD-10-CM | POA: Diagnosis not present

## 2020-12-10 DIAGNOSIS — I130 Hypertensive heart and chronic kidney disease with heart failure and stage 1 through stage 4 chronic kidney disease, or unspecified chronic kidney disease: Secondary | ICD-10-CM | POA: Diagnosis not present

## 2020-12-10 DIAGNOSIS — M25511 Pain in right shoulder: Secondary | ICD-10-CM | POA: Diagnosis not present

## 2020-12-10 DIAGNOSIS — R296 Repeated falls: Secondary | ICD-10-CM | POA: Diagnosis not present

## 2020-12-10 DIAGNOSIS — E1122 Type 2 diabetes mellitus with diabetic chronic kidney disease: Secondary | ICD-10-CM | POA: Diagnosis not present

## 2020-12-11 ENCOUNTER — Other Ambulatory Visit: Payer: Self-pay | Admitting: *Deleted

## 2020-12-11 DIAGNOSIS — E1159 Type 2 diabetes mellitus with other circulatory complications: Secondary | ICD-10-CM

## 2020-12-11 DIAGNOSIS — I152 Hypertension secondary to endocrine disorders: Secondary | ICD-10-CM

## 2020-12-12 ENCOUNTER — Ambulatory Visit (INDEPENDENT_AMBULATORY_CARE_PROVIDER_SITE_OTHER): Payer: Medicare Other | Admitting: Emergency Medicine

## 2020-12-12 ENCOUNTER — Encounter: Payer: Self-pay | Admitting: Emergency Medicine

## 2020-12-12 ENCOUNTER — Other Ambulatory Visit: Payer: Self-pay

## 2020-12-12 VITALS — BP 138/78 | HR 71 | Temp 98.2°F | Ht 61.0 in | Wt 249.8 lb

## 2020-12-12 DIAGNOSIS — I4891 Unspecified atrial fibrillation: Secondary | ICD-10-CM

## 2020-12-12 DIAGNOSIS — E1159 Type 2 diabetes mellitus with other circulatory complications: Secondary | ICD-10-CM

## 2020-12-12 DIAGNOSIS — E1169 Type 2 diabetes mellitus with other specified complication: Secondary | ICD-10-CM

## 2020-12-12 DIAGNOSIS — I700 Atherosclerosis of aorta: Secondary | ICD-10-CM | POA: Diagnosis not present

## 2020-12-12 DIAGNOSIS — Z7901 Long term (current) use of anticoagulants: Secondary | ICD-10-CM

## 2020-12-12 DIAGNOSIS — I110 Hypertensive heart disease with heart failure: Secondary | ICD-10-CM

## 2020-12-12 DIAGNOSIS — D6859 Other primary thrombophilia: Secondary | ICD-10-CM | POA: Diagnosis not present

## 2020-12-12 DIAGNOSIS — Z8719 Personal history of other diseases of the digestive system: Secondary | ICD-10-CM | POA: Diagnosis not present

## 2020-12-12 DIAGNOSIS — E669 Obesity, unspecified: Secondary | ICD-10-CM

## 2020-12-12 DIAGNOSIS — I5032 Chronic diastolic (congestive) heart failure: Secondary | ICD-10-CM | POA: Diagnosis not present

## 2020-12-12 DIAGNOSIS — G4733 Obstructive sleep apnea (adult) (pediatric): Secondary | ICD-10-CM

## 2020-12-12 DIAGNOSIS — R1010 Upper abdominal pain, unspecified: Secondary | ICD-10-CM | POA: Diagnosis not present

## 2020-12-12 DIAGNOSIS — Z9989 Dependence on other enabling machines and devices: Secondary | ICD-10-CM

## 2020-12-12 DIAGNOSIS — I152 Hypertension secondary to endocrine disorders: Secondary | ICD-10-CM

## 2020-12-12 LAB — POCT CBC
Granulocyte percent: 73.5 %G (ref 37–80)
HCT, POC: 39.6 % (ref 29–41)
Hemoglobin: 12.9 g/dL (ref 11–14.6)
Lymph, poc: 2.9 (ref 0.6–3.4)
MCH, POC: 27.8 pg (ref 27–31.2)
MCHC: 32.4 g/dL (ref 31.8–35.4)
MCV: 85.8 fL (ref 76–111)
MID (cbc): 0.2 (ref 0–0.9)
MPV: 7 fL (ref 0–99.8)
POC Granulocyte: 8.7 — AB (ref 2–6.9)
POC LYMPH PERCENT: 24.4 %L (ref 10–50)
POC MID %: 2.1 %M (ref 0–12)
Platelet Count, POC: 366 10*3/uL (ref 142–424)
RBC: 4.62 M/uL (ref 4.04–5.48)
RDW, POC: 17 %
WBC: 11.9 10*3/uL — AB (ref 4.6–10.2)

## 2020-12-12 LAB — IFOBT (OCCULT BLOOD): IFOBT: NEGATIVE

## 2020-12-12 NOTE — Progress Notes (Signed)
[name] 80 y.o.   Chief Complaint  Patient presents with  . Abdominal Pain    Pain been going on for 3 week. For the past 4 days stool has been black    HISTORY OF PRESENT ILLNESS: This is a 80 y.o. female complaining of intermittent crampy upper abdominal pain for the past 3 weeks followed by black stools for the last 4 days.  Denies nausea or vomiting.  Denies syncope. Patient has multiple chronic medical problems including A. fib on long-term anticoagulation, Eliquis, and also history of aortic atherosclerosis and congestive heart failure among other things. Denies any other significant associated symptoms at present time.  HPI   Prior to Admission medications   Medication Sig Start Date End Date Taking? Authorizing Provider  Accu-Chek Softclix Lancets lancets 1 each by Other route 3 (three) times daily. as directed 03/30/20  Yes Maximiano Coss, NP  allopurinol (ZYLOPRIM) 300 MG tablet Take 300 mg by mouth daily.  07/07/13  Yes [provider]  amiodarone (PACERONE) 200 MG tablet Take 1 tablet (200 mg total) by mouth daily. 07/18/20  Yes Sherran Needs, NP  apixaban (ELIQUIS) 5 MG TABS tablet Take 1 tablet (5 mg total) by mouth 2 (two) times daily. 06/14/20  Yes [name], MD  blood glucose meter kit and supplies Dispense based on patient and insurance preference. Use up to four times daily as directed. (FOR ICD-10 E10.9, E11.9). 05/03/20  Yes Jaskirat Zertuche, Ines Bloomer, MD  cloNIDine (CATAPRES) 0.3 MG tablet TAKE 1 TABLET(0.3 MG) BY MOUTH TWICE DAILY 09/30/20  Yes Mohanad Carsten, Ines Bloomer, MD  Continuous Blood Gluc Receiver (FREESTYLE LIBRE 14 DAY READER) DEVI Use as directed. 04/18/20  Yes Bricelyn Freestone, Ines Bloomer, MD  Continuous Blood Gluc Sensor (FREESTYLE LIBRE 14 DAY SENSOR) MISC Use as directed to check blood sugar daily 09/24/20  Yes [name], Melanie Crazier, MD  cyclobenzaprine (FLEXERIL) 5 MG tablet  07/08/20  Yes [provider]  diltiazem (CARDIZEM) 30  MG tablet Take 30 mg by mouth. Take one tablet every 4 hours as needed for heart rate greater than 100 and blood pressure needs to be above 100   Yes Sherran Needs, NP  Insulin Lispro Prot & Lispro (HUMALOG 75/25 MIX) (75-25) 100 UNIT/ML Kwikpen Inject 15-21 Units into the skin with breakfast, with lunch, and with evening meal. Sliding scale insulin 08/12/16  Yes [provider]  levothyroxine (SYNTHROID, LEVOTHROID) 75 MCG tablet Take 75 mcg daily before breakfast by mouth.  12/29/16  Yes [provider]  lidocaine (LIDODERM) 5 % Place 1 patch onto the skin daily. Remove & Discard patch within 12 hours or as directed by MD 08/29/20  Yes Mcarthur Rossetti, MD  loperamide (IMODIUM) 2 MG capsule Take 1 capsule (2 mg total) by mouth 4 (four) times daily as needed for diarrhea or loose stools. 03/20/20  Yes Garald Balding, PA-C  losartan (COZAAR) 100 MG tablet Take 1 tablet (100 mg total) by mouth daily. 05/31/20  Yes Kru Allman, Ines Bloomer, MD  NOVOFINE PLUS 32G X 4 MM MISC 1 each by Subdermal route 2 (two) times daily. as directed 03/30/20  Yes Maximiano Coss, NP  [facility] VERIO test strip 1 each 3 (three) times daily. 06/08/20  Yes [provider]  potassium chloride (KLOR-CON) 10 MEQ tablet Take 2 tablets (20 mEq total) by mouth daily. 10/18/19  Yes [name], MD  RESTASIS MULTIDOSE 0.05 % ophthalmic emulsion Place 1 drop into both eyes 2 (two) times daily.  03/04/20  Yes [provider]  rosuvastatin (CRESTOR) 10 MG tablet Take 10 mg by mouth every evening.   Yes [provider]  Semaglutide,0.25 or 0.5MG/DOS, (OZEMPIC, 0.25 OR 0.5 MG/DOSE,) 2 MG/1.5ML SOPN Inject 0.75 mLs (1 mg total) into the skin once a week. 04/18/20  Yes Donyale Berthold, Ines Bloomer, MD  tiZANidine (ZANAFLEX) 4 MG tablet Take 1 tablet (4 mg total) by mouth every 8 (eight) hours as needed for muscle spasms. 08/29/20  Yes Mcarthur Rossetti, MD  torsemide (DEMADEX) 20 MG tablet TAKE  2 TABLETS(40 MG) BY MOUTH DAILY 11/07/20  Yes [name], MD  triamcinolone cream (KENALOG) 0.1 % Apply 1 application topically 2 (two) times daily. 07/26/20  Yes Masa Lubin, Ines Bloomer, MD  verapamil (CALAN) 120 MG tablet Take 1 tablet (120 mg total) by mouth 2 (two) times daily. 08/15/20  Yes [name], MD    Allergies  Allergen Reactions  . Metformin Diarrhea    Patient Active Problem List   Diagnosis Date Noted  . Persistent atrial fibrillation ([place]) 08/12/2020  . Type 2 diabetes mellitus with hyperglycemia, with long-term current use of insulin ([place]) 06/21/2020  . Type 2 diabetes mellitus with diabetic polyneuropathy, with long-term current use of insulin ([place]) 06/21/2020  . Mixed hyperlipidemia 06/21/2020  . Uncontrolled hypertension 06/11/2020  . Aortic atherosclerosis ([place]) 03/28/2020  . Diabetic peripheral neuropathy ([place]) 12/26/2019  . Gastroesophageal reflux disease without esophagitis 03/17/2019  . Bilateral leg edema 02/16/2019  . Dysgeusia 06/11/2017  . Allergic dermatitis 06/03/2017  . Obesity 12/19/2015  . Hypersomnia 12/19/2015  . Edema 10/18/2015  . Acquired hypothyroidism 10/18/2015  . Acute diastolic congestive heart failure, NYHA class 3 ([place]) 10/03/2013  . Prolonged QT interval 10/03/2013  . Acute diastolic heart failure ([place]) 10/03/2013  . Long term (current) use of anticoagulants 06/09/2012  . BENIGN NEOPLASM OF ADRENAL GLAND 11/25/2010  . Chronic diastolic heart failure ([place]) 02/20/2010  . DM 05/16/2009  . GOUT 05/16/2009  . OBESITY, MORBID 05/16/2009  . Hypertension associated with diabetes ([place]) 05/16/2009  . Paroxysmal atrial fibrillation ([place]) 05/16/2009  . HYPERLIPIDEMIA 11/30/2008  . Obstructive sleep apnea on CPAP 11/30/2008    Past Medical History:  Diagnosis Date  . Arthritis   . Back pain   . Chronic anticoagulation    due to aflutter  . Chronic kidney disease   . Diabetes mellitus   . Diastolic CHF, chronic ([place])    a.   echo 2006 - ef 55-65%; mild diast dysfxn;    b. Echo 08/2011: Mild LVH, EF 60%;  c. 04/2013 Echo: EF 65-69%, mild conc LVH;  08/2014 Echo: EF 60-65%, mild-mod MR.  . Gout   . Hyperlipidemia   . Hypertension    a.  Renal arterial Dopplers 12/2011: 1-59% right renal artery stenosis  . Morbid obesity ([place])   . Obstructive sleep apnea on CPAP   . Paroxysmal Afib/Flutter    a. 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 APPENDECTOMY    . ATRIAL FLUTTER ABLATION N/A 09/24/2011   Procedure: ATRIAL FLUTTER ABLATION;  Surgeon: [name], MD;  Location: [facility] CATH LAB;  Service: Cardiovascular;  Laterality: N/A;  . CARDIOVERSION  10/22/2011   Procedure: CARDIOVERSION;  Surgeon: [name], MD;  Location: [place];  Service: Cardiovascular;  Laterality: N/A;  . CARDIOVERSION N/A 09/10/2011   Procedure: CARDIOVERSION;  Surgeon: [name], MD;  Location: [facility] CATH LAB;  Service: Cardiovascular;  Laterality: N/A;  . CHOLECYSTECTOMY    . TONSILLECTOMY  1982  . TOTAL ABDOMINAL HYSTERECTOMY      Social History   Socioeconomic History  . Marital status: Married    Spouse name: Not on file  . Number of children: 3  . Years of education: Not on file  . Highest education level: Not on file  Occupational History  . Occupation: DISABILITY/housewife    Employer: RETIRED  Tobacco Use  . Smoking status: Never Smoker  . Smokeless tobacco: Never Used  Vaping Use  . Vaping Use: Never used  Substance and Sexual Activity  . Alcohol use: No  . Drug use: No  . Sexual activity: Yes  Other Topics Concern  . Not on file  Social History Narrative  . Not on file   Social Determinants of Health   Financial Resource Strain: Not on file  Food Insecurity: Not on file  Transportation Needs: Not on file  Physical Activity: Not on file  Stress: Not on file  Social Connections: Not on file  Intimate Partner Violence: Not on file    Family History   Problem Relation Age of Onset  . Heart disease Father   . Hypertension Father   . Breast cancer Sister   . Cancer Sister        breast     Review of Systems  Constitutional: Negative.  Negative for chills and fever.  HENT: Negative.  Negative for congestion and sore throat.   Respiratory: Negative.  Negative for cough and shortness of breath.   Cardiovascular: Negative.  Negative for chest pain and palpitations.  Gastrointestinal: Positive for abdominal pain and melena. Negative for blood in stool, constipation, diarrhea, nausea and vomiting.  Skin: Negative.   Neurological: Negative.  Negative for dizziness and headaches.  All other systems reviewed and are negative.   Today's Vitals   12/12/20 1019  BP: 138/78  Pulse: 71  Temp: 98.2 F (36.8 C)  TempSrc: Temporal  SpO2: 96%  Weight: 249 lb 12.8 oz (113.3 kg)  Height: _0  (1.549 m)   Body mass index is 47.2 kg/m.  Physical Exam Vitals reviewed. Exam conducted with a chaperone present.  Constitutional:      Appearance: She is obese.  HENT:     Head: Normocephalic.  Eyes:     Extraocular Movements: Extraocular movements intact.     Pupils: Pupils are equal, round, and reactive to light.  Cardiovascular:     Rate and Rhythm: Normal rate.  Pulmonary:     Effort: Pulmonary effort is normal.  Abdominal:     General: There is no distension.     Palpations: Abdomen is soft.     Tenderness: There is no abdominal tenderness.  Genitourinary:    Rectum: Normal. Guaiac result negative. No mass or tenderness. Normal anal tone.  Musculoskeletal:        General: Normal range of motion.     Cervical back: Normal range of motion.  Skin:    General: Skin is warm and dry.     Capillary Refill: Capillary refill takes less than 2 seconds.  Neurological:     General: No focal deficit present.     Mental Status: She is alert and oriented to person, place, and time.  Psychiatric:        Mood and Affect: Mood normal.         Behavior: Behavior normal.     Results for orders placed or performed in visit on 12/12/20 (  from the past 24 hour(s))  POCT CBC     Status: Abnormal   Collection Time: 12/12/20 11:33 AM  Result Value Ref Range   WBC 11.9 (A) 4.6 - 10.2 K/uL   Lymph, poc 2.9 0.6 - 3.4   POC LYMPH PERCENT 24.4 10 - 50 %L   MID (cbc) 0.2 0 - 0.9   POC MID % 2.1 0 - 12 %M   POC Granulocyte 8.7 (A) 2 - 6.9   Granulocyte percent 73.5 37 - 80 %G   RBC 4.62 4.04 - 5.48 M/uL   Hemoglobin 12.9 11 - 14.6 g/dL   HCT, POC 39.6 29 - 41 %   MCV 85.8 76 - 111 fL   MCH, POC 27.8 27 - 31.2 pg   MCHC 32.4 31.8 - 35.4 g/dL   RDW, POC 17.0 %   Platelet Count, POC 366 142 - 424 K/uL   MPV 7.0 0 - 99.8 fL  IFOBT POC (occult bld, rslt in office)     Status: None   Collection Time: 12/12/20 11:35 AM  Result Value Ref Range   IFOBT Negative     ASSESSMENT & PLAN: Clinically stable.  No medical concerns identified during this visit. Continue present medications.  No changes. Will refer to GI for possible upper endoscopy. Follow-up with me as already scheduled. Kynslei was seen today for abdominal pain.  Diagnoses and all orders for this visit:  Upper abdominal pain -     Ambulatory referral to Gastroenterology -     POCT CBC -     IFOBT POC (occult bld, rslt in office); Future -     IFOBT POC (occult bld, rslt in office)  History of melena -     Ambulatory referral to Gastroenterology -     POCT CBC  Obesity, diabetes, and hypertension syndrome (HCC)  Chronic diastolic heart failure (HCC)  Hypertensive heart disease with chronic diastolic congestive heart failure (HCC)  Atrial fibrillation, unspecified type (HCC)  Thrombophilia ([place])  Aortic atherosclerosis ([place])  Long term (current) use of anticoagulants  Obstructive sleep apnea on CPAP  Morbid obesity ([place])    Patient Instructions       If you have lab work done today you will be contacted with your lab results within the next 2  weeks.  If you have not heard from [place] then please contact us. The fastest way to get your results is to register for My Chart.   IF you received an x-ray today, you will receive an invoice from [facility] Radiology. Please contact [facility] Radiology at [phone] with questions or concerns regarding your invoice.   IF you received labwork today, you will receive an invoice from [facility]. Please contact [facility] at [phone] with questions or concerns regarding your invoice.   Our billing staff will not be able to assist you with questions regarding bills from these companies.  You will be contacted with the lab results as soon as they are available. The fastest way to get your results is to activate your My Chart account. Instructions are located on the last page of this paperwork. If you have not heard from [place] regarding the results in 2 weeks, please contact this office.     Dolor abdominal en los adultos Abdominal Pain, Adult El dolor de [place] (abdominal) puede tener muchas causas. [place] veces, el dolor de [place] no es peligroso. Muchos de [employer] de dolor de estmago pueden controlarse y tratarse en casa. Sin embargo,  a veces, el dolor de estmago es grave. El mdico intentar descubrir la causa del dolor de [place]. Siga estas instrucciones en su casa: Medicamentos  [employer] de venta libre y los recetados solamente como se lo haya indicado el mdico.  No tome medicamentos que lo ayuden a Landscape architect (laxantes), salvo que el mdico se lo indique. Instrucciones generales  Est atento al dolor de estmago para Actuary cambio.  Beba suficiente lquido para Contractor pis (la orina) de color amarillo plido.  Concurra a todas las visitas de seguimiento como se lo haya indicado el mdico. Esto es importante.   Comunquese con un mdico si:  El dolor de estmago cambia o [place].  No tiene apetito o baja de peso sin  proponrselo.  Tiene dificultades para defecar (est estreido) o heces lquidas (diarrea) durante ms de 2 o 3das.  Siente dolor al orinar o defecar.  El dolor de estmago lo despierta de noche.  El dolor empeora con las comidas, despus de comer o con determinados alimentos.  Tiene vmitos y no puede retener nada de lo que ingiere.  Tiene fiebre.  Observa sangre en la orina. Solicite ayuda de inmediato si:  El dolor no desaparece en el tiempo indicado por el mdico.  No puede dejar de vomitar.  Siente dolor solamente en zonas especficas del abdomen, como el lado derecho o la parte inferior izquierda.  Tiene heces con sangre, de color negro o con aspecto alquitranado.  Tiene dolor muy intenso en el vientre, clicos o meteorismo.  Presenta signos de no tener suficientes lquidos o agua en el cuerpo (deshidratacin), por ejemplo: ? Elmon Else, muy escasa o falta de orina. ? Labios agrietados. ? Sequedad de boca. ? Ojos hundidos. ? Somnolencia. ? Debilidad.  Tiene dificultad para respirar o Tourist information centre manager. Resumen  Muchos de [employer] de dolor de estmago pueden controlarse y tratarse en casa.  Est atento al dolor de estmago para Actuary cambio.  Tome los medicamentos de venta libre y los recetados solamente como se lo haya indicado el mdico.  Comunquese con un mdico si el dolor de estmago cambia o [place].  Busque ayuda de inmediato si tiene dolor muy intenso en el vientre, clicos o meteorismo. Esta informacin no tiene Marine scientist el consejo del mdico. Asegrese de hacerle al mdico cualquier pregunta que tenga. Document Revised: 04/13/2019 Document Reviewed: 04/13/2019 Elsevier Patient Education  2021 Elsevier Inc.      Agustina Caroli, MD [facility]

## 2020-12-12 NOTE — Patient Instructions (Addendum)
If you have lab work done today you will be contacted with your lab results within the next 2 weeks.  If you have not heard from [place] then please contact us. The fastest way to get your results is to register for My Chart.   IF you received an x-ray today, you will receive an invoice from [facility] Radiology. Please contact [facility] Radiology at [phone] with questions or concerns regarding your invoice.   IF you received labwork today, you will receive an invoice from [place]. Please contact [facility] at [phone] with questions or concerns regarding your invoice.   Our billing staff will not be able to assist you with questions regarding bills from these companies.  You will be contacted with the lab results as soon as they are available. The fastest way to get your results is to activate your My Chart account. Instructions are located on the last page of this paperwork. If you have not heard from [place] regarding the results in 2 weeks, please contact this office.     Dolor abdominal en los adultos Abdominal Pain, Adult El dolor de [place] (abdominal) puede tener muchas causas. [place] veces, el dolor de [place] no es peligroso. Muchos de [employer] de dolor de estmago pueden controlarse y tratarse en casa. Sin embargo, a Clinical cytogeneticist, Conservation officer, historic buildings de [place] es grave. El mdico intentar descubrir la causa del dolor de [place]. Siga estas instrucciones en su casa: Medicamentos  [employer] de venta libre y los recetados solamente como se lo haya indicado el mdico.  No tome medicamentos que lo ayuden a Landscape architect (laxantes), salvo que el mdico se lo indique. Instrucciones generales  Est atento al dolor de estmago para Actuary cambio.  Beba suficiente lquido para Contractor pis (la orina) de color amarillo plido.  Concurra a todas las visitas de seguimiento como se lo haya indicado el mdico. Esto es importante.   Comunquese con un mdico  si:  El dolor de estmago cambia o [place].  No tiene apetito o baja de peso sin proponrselo.  Tiene dificultades para defecar (est estreido) o heces lquidas (diarrea) durante ms de 2 o 3das.  Siente dolor al orinar o defecar.  El dolor de estmago lo despierta de noche.  El dolor empeora con las comidas, despus de comer o con determinados alimentos.  Tiene vmitos y no puede retener nada de lo que ingiere.  Tiene fiebre.  Observa sangre en la orina. Solicite ayuda de inmediato si:  El dolor no desaparece en el tiempo indicado por el mdico.  No puede dejar de vomitar.  Siente dolor solamente en zonas especficas del abdomen, como el lado derecho o la parte inferior izquierda.  Tiene heces con sangre, de color negro o con aspecto alquitranado.  Tiene dolor muy intenso en el vientre, clicos o meteorismo.  Presenta signos de no tener suficientes lquidos o agua en el cuerpo (deshidratacin), por ejemplo: ? Elmon Else, muy escasa o falta de orina. ? Labios agrietados. ? Sequedad de boca. ? Ojos hundidos. ? Somnolencia. ? Debilidad.  Tiene dificultad para respirar o Tourist information centre manager. Resumen  Muchos de [employer] de dolor de estmago pueden controlarse y tratarse en casa.  Est atento al dolor de estmago para Actuary cambio.  Tome los medicamentos de venta libre y los recetados solamente como se lo haya indicado el mdico.  Comunquese con un mdico si el dolor de estmago cambia o [place].  Busque ayuda  de inmediato si tiene dolor muy intenso en el vientre, clicos o meteorismo. Esta informacin no tiene Marine scientist el consejo del mdico. Asegrese de hacerle al mdico cualquier pregunta que tenga. Document Revised: 04/13/2019 Document Reviewed: 04/13/2019 Elsevier Patient Education  [place].

## 2020-12-14 ENCOUNTER — Other Ambulatory Visit: Payer: Self-pay | Admitting: Internal Medicine

## 2020-12-14 NOTE — Telephone Encounter (Signed)
Prescription refill request for Eliquis received.  Indication: afib Last office visit: Caryl Comes, 11/02/2020 Scr: 0.74, 11/29/2020 Age: 80 yo  Weight: 113.3 kg    Pt is on the correct dose of Eliquis per dosing criteria, prescription refill sent for Eliquis 5mg  BID.

## 2020-12-18 NOTE — Telephone Encounter (Signed)
No action needed at this time, closing open encounter

## 2020-12-19 ENCOUNTER — Encounter: Payer: Self-pay | Admitting: Orthopaedic Surgery

## 2020-12-19 ENCOUNTER — Other Ambulatory Visit: Payer: Self-pay

## 2020-12-19 ENCOUNTER — Encounter: Payer: Self-pay | Admitting: Podiatry

## 2020-12-19 ENCOUNTER — Ambulatory Visit (INDEPENDENT_AMBULATORY_CARE_PROVIDER_SITE_OTHER): Payer: Medicare Other | Admitting: Podiatry

## 2020-12-19 ENCOUNTER — Telehealth: Payer: Self-pay | Admitting: Emergency Medicine

## 2020-12-19 ENCOUNTER — Ambulatory Visit (INDEPENDENT_AMBULATORY_CARE_PROVIDER_SITE_OTHER): Payer: Medicare Other | Admitting: Orthopaedic Surgery

## 2020-12-19 VITALS — Ht 61.0 in | Wt 249.0 lb

## 2020-12-19 DIAGNOSIS — M2012 Hallux valgus (acquired), left foot: Secondary | ICD-10-CM

## 2020-12-19 DIAGNOSIS — G8929 Other chronic pain: Secondary | ICD-10-CM

## 2020-12-19 DIAGNOSIS — M2041 Other hammer toe(s) (acquired), right foot: Secondary | ICD-10-CM

## 2020-12-19 DIAGNOSIS — Z794 Long term (current) use of insulin: Secondary | ICD-10-CM | POA: Diagnosis not present

## 2020-12-19 DIAGNOSIS — E1142 Type 2 diabetes mellitus with diabetic polyneuropathy: Secondary | ICD-10-CM

## 2020-12-19 DIAGNOSIS — M79675 Pain in left toe(s): Secondary | ICD-10-CM

## 2020-12-19 DIAGNOSIS — B351 Tinea unguium: Secondary | ICD-10-CM

## 2020-12-19 DIAGNOSIS — M2011 Hallux valgus (acquired), right foot: Secondary | ICD-10-CM

## 2020-12-19 DIAGNOSIS — M25511 Pain in right shoulder: Secondary | ICD-10-CM | POA: Diagnosis not present

## 2020-12-19 DIAGNOSIS — M79674 Pain in right toe(s): Secondary | ICD-10-CM

## 2020-12-19 DIAGNOSIS — M25562 Pain in left knee: Secondary | ICD-10-CM

## 2020-12-19 DIAGNOSIS — M25561 Pain in right knee: Secondary | ICD-10-CM | POA: Diagnosis not present

## 2020-12-19 DIAGNOSIS — M2042 Other hammer toe(s) (acquired), left foot: Secondary | ICD-10-CM

## 2020-12-19 DIAGNOSIS — Z96611 Presence of right artificial shoulder joint: Secondary | ICD-10-CM

## 2020-12-19 NOTE — Telephone Encounter (Signed)
Patient called to check on the status of recent referral Gave her the location as [facility] .  Patient will call to set up an appointment as she has not heard anything yet.

## 2020-12-19 NOTE — Progress Notes (Signed)
The patient comes in for follow-up with continued right shoulder pain.  She is someone that does ambulate with a walker.  She is morbidly obese and has used her shoulders a lot a push-up with getting up.  So in essence she does walk through her shoulders.  She has been to physical therapy for the right shoulder and that has not helped.  She had a steroid injection in the right shoulder and it significantly elevated her blood glucose levels to the point that I would not recommend any other steroid injection.  She says the therapist request that she have a MRI of the right shoulder due to continued pain.  I put the shoulder through range of motion on the right side and she is painful.  There is not a lot of grinding at the glenohumeral joint but she has a significant of pain throughout the shoulder.  Certainly she may always have pain in that shoulder due to the fact that she has to get around with a walker.  We will obtain a MRI of the right shoulder to assess the rotator cuff and the cartilage.  Given the failure of conservative treatment including activity modification and steroid injections as well as physical therapy this would be indicated at this standpoint.  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  We had recommended a hyaluronic acid injection for that knee and she is approved for this.  However, she says the knee does not really hurt but it just catches quite a bit.  The only thing that could help with that would be a knee replacement which she is not a candidate for given her morbid obesity combined with her diabetes.  We will see her back once she has the MRI of her right shoulder.

## 2020-12-21 ENCOUNTER — Other Ambulatory Visit: Payer: Self-pay | Admitting: Emergency Medicine

## 2020-12-21 ENCOUNTER — Telehealth: Payer: Self-pay | Admitting: Emergency Medicine

## 2020-12-21 DIAGNOSIS — M109 Gout, unspecified: Secondary | ICD-10-CM

## 2020-12-21 MED ORDER — ALLOPURINOL 300 MG PO TABS: 300.0000 mg | ORAL_TABLET | Freq: Every day | ORAL | 0 refills | Status: DC

## 2020-12-21 NOTE — Telephone Encounter (Signed)
30 day supply has been sent to [facility]

## 2020-12-21 NOTE — Telephone Encounter (Signed)
Patient is asking for a new prescription  Allopurinol 300 mg . This was given to pt by another provider in [facility] / pt didn't notice that she had not gotten an refill from Dr. Mitchel Honour yet  And patient will be out on Monday .  Pt needs a courtesy refill sent to  [facility]  [place], [place] [address]  Phone:  [phone] Fax:  [phone]    Please advise

## 2020-12-24 ENCOUNTER — Ambulatory Visit (INDEPENDENT_AMBULATORY_CARE_PROVIDER_SITE_OTHER): Payer: Medicare Other | Admitting: Gastroenterology

## 2020-12-24 ENCOUNTER — Encounter: Payer: Self-pay | Admitting: Gastroenterology

## 2020-12-24 VITALS — BP 106/60 | HR 64 | Ht 61.0 in | Wt 253.0 lb

## 2020-12-24 DIAGNOSIS — R109 Unspecified abdominal pain: Secondary | ICD-10-CM | POA: Diagnosis not present

## 2020-12-24 DIAGNOSIS — K5900 Constipation, unspecified: Secondary | ICD-10-CM

## 2020-12-24 NOTE — Progress Notes (Signed)
HPI: This is a very pleasant 80 year old woman whom I am meeting for the first time, she was referred by Dr. Mitchel Honour, her primary care physician.  Previously a patient at [facility] gastroenterology.  She tells me she underwent a colonoscopy and an upper endoscopy 3 or so years ago and that they were normal.  For the past 6 or 8 weeks she has had abdominal cramping, bloating, altered bowel habits.  She has not had any vomiting.  She has had a lot of foul tasting burps.  Her stool was dark on a single occasion but she was Hemoccult negative through her primary care office.  She does not drink Pepto-Bismol, she does not take NSAIDs.  She has tried successfully losing weight especially since starting a diabetes medicine she has lost 6 pounds.  She is on Eliquis for atrial fibrillation.  She has irregular bowel habits with constipation alternating with urgent loose stools.  Sometimes she will go for days without having a bowel movement.  She never sees blood in her stool  Old Data Reviewed:  CT scan abdomen pelvis with IV and oral contrast June 2021, indication right upper quadrant abdominal pain.  Diarrhea for 4 days."  Findings "no acute process in the abdomen or pelvis.  Right adrenal benign lesion.  Fat-containing right abdominal wall hernias."  Lab work February 2022 CBC was normal except for white blood cell count 11,000, complete metabolic profile was normal, hemoglobin A1c was 7.4, fecal occult blood testing was negative for blood   Review of systems: Pertinent positive and negative review of systems were noted in the above HPI section. All other review negative.   Past Medical History:  Diagnosis Date   Arthritis    Back pain    Chronic anticoagulation    due to aflutter   Chronic kidney disease    Diabetes mellitus    Diastolic CHF, chronic ([place])    a.  echo 2006 - ef 55-65%; mild diast dysfxn;    b. Echo 08/2011: Mild LVH, EF 60%;  c. 04/2013 Echo: EF 65-69%, mild conc  LVH;  08/2014 Echo: EF 60-65%, mild-mod MR.   Gout    Hyperlipidemia    Hypertension    a.  Renal arterial Dopplers 12/2011: 1-59% right renal artery stenosis   Morbid obesity ([place])    Obstructive sleep apnea on CPAP    Paroxysmal Afib/Flutter    a. 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    Past Surgical History:  Procedure Laterality Date   APPENDECTOMY     ATRIAL FLUTTER ABLATION N/A 09/24/2011   Procedure: ATRIAL FLUTTER ABLATION;  Surgeon: [name], MD;  Location: [facility] CATH LAB;  Service: Cardiovascular;  Laterality: N/A;   CARDIOVERSION  10/22/2011   Procedure: CARDIOVERSION;  Surgeon: [name], MD;  Location: [place];  Service: Cardiovascular;  Laterality: N/A;   CARDIOVERSION N/A 09/10/2011   Procedure: CARDIOVERSION;  Surgeon: [name], MD;  Location: [facility] CATH LAB;  Service: Cardiovascular;  Laterality: N/A;   CHOLECYSTECTOMY     TONSILLECTOMY  1982   TOTAL ABDOMINAL HYSTERECTOMY      Current Outpatient Medications  Medication Sig Dispense Refill   Accu-Chek Softclix Lancets lancets 1 each by Other route 3 (three) times daily. as directed 100 each 3   allopurinol (ZYLOPRIM) 300 MG tablet Take 1 tablet (300 mg total) by mouth daily. 30 tablet 0   amiodarone (PACERONE) 200 MG tablet Take 1 tablet (200 mg total) by mouth daily. Sonterra  tablet 3   blood glucose meter kit and supplies Dispense based on patient and insurance preference. Use up to four times daily as directed. (FOR ICD-10 E10.9, E11.9). 1 each 0   cloNIDine (CATAPRES) 0.3 MG tablet TAKE 1 TABLET(0.3 MG) BY MOUTH TWICE DAILY 180 tablet 0   Continuous Blood Gluc Receiver (FREESTYLE LIBRE 14 DAY READER) DEVI Use as directed. 1 each 0   Continuous Blood Gluc Sensor (FREESTYLE LIBRE 14 DAY SENSOR) MISC Use as directed to check blood sugar daily 2 each 3   diltiazem (CARDIZEM) 30 MG tablet Take 30 mg by mouth. Take one tablet every 4 hours as needed for heart rate greater than 100 and blood  pressure needs to be above 100     ELIQUIS 5 MG TABS tablet TAKE 1 TABLET(5 MG) BY MOUTH TWICE DAILY 180 tablet 1   Insulin Lispro Prot & Lispro (HUMALOG 75/25 MIX) (75-25) 100 UNIT/ML Kwikpen Inject 15-21 Units into the skin with breakfast, with lunch, and with evening meal. Sliding scale insulin     levothyroxine (SYNTHROID, LEVOTHROID) 75 MCG tablet Take 75 mcg daily before breakfast by mouth.      lidocaine (LIDODERM) 5 % Place 1 patch onto the skin daily. Remove & Discard patch within 12 hours or as directed by MD 30 patch 0   loperamide (IMODIUM) 2 MG capsule Take 1 capsule (2 mg total) by mouth 4 (four) times daily as needed for diarrhea or loose stools. 12 capsule 0   losartan (COZAAR) 100 MG tablet Take 1 tablet (100 mg total) by mouth daily. 90 tablet 3   NOVOFINE PLUS 32G X 4 MM MISC 1 each by Subdermal route 2 (two) times daily. as directed 60 each 3   ONETOUCH VERIO test strip 1 each 3 (three) times daily.     potassium chloride (KLOR-CON) 10 MEQ tablet Take 2 tablets (20 mEq total) by mouth daily. 180 tablet 3   RESTASIS MULTIDOSE 0.05 % ophthalmic emulsion Place 1 drop into both eyes 2 (two) times daily.      rosuvastatin (CRESTOR) 10 MG tablet Take 10 mg by mouth every evening.     Semaglutide,0.25 or 0.5MG/DOS, (OZEMPIC, 0.25 OR 0.5 MG/DOSE,) 2 MG/1.5ML SOPN Inject 0.75 mLs (1 mg total) into the skin once a week. 12 pen 3   torsemide (DEMADEX) 20 MG tablet TAKE 2 TABLETS(40 MG) BY MOUTH DAILY 180 tablet 3   triamcinolone cream (KENALOG) 0.1 % Apply 1 application topically 2 (two) times daily. 30 g 0   verapamil (CALAN) 120 MG tablet Take 1 tablet (120 mg total) by mouth 2 (two) times daily. 180 tablet 3   tiZANidine (ZANAFLEX) 4 MG tablet Take 1 tablet (4 mg total) by mouth every 8 (eight) hours as needed for muscle spasms. (Patient not taking: Reported on 12/24/2020) 60 tablet 1   No current facility-administered medications for this visit.    Allergies as of  12/24/2020 - Review Complete 12/24/2020  Allergen Reaction Noted   Metformin Diarrhea 12/10/2015    Family History  Problem Relation Age of Onset   Heart disease Father    Hypertension Father    Breast cancer Sister    Cancer Sister        breast   Colon cancer Neg Hx    Esophageal cancer Neg Hx    Pancreatic cancer Neg Hx    Liver disease Neg Hx     Social History   Socioeconomic History   Marital status: Married  Spouse name: Not on file   Number of children: 3   Years of education: Not on file   Highest education level: Not on file  Occupational History   Occupation: DISABILITY/housewife    Employer: RETIRED  Tobacco Use   Smoking status: Never Smoker   Smokeless tobacco: Never Used  Vaping Use   Vaping Use: Never used  Substance and Sexual Activity   Alcohol use: No   Drug use: No   Sexual activity: Yes  Other Topics Concern   Not on file  Social History Narrative   Not on file   Social Determinants of Health   Financial Resource Strain: Not on file  Food Insecurity: Not on file  Transportation Needs: Not on file  Physical Activity: Not on file  Stress: Not on file  Social Connections: Not on file  Intimate Partner Violence: Not on file     Physical Exam: BP 106/60    Pulse 64    Ht 5' 1" (1.549 m)    Wt 253 lb (114.8 kg)    SpO2 97%    BMI 47.80 kg/m  Constitutional: generally well-appearing Psychiatric: alert and oriented x3 Eyes: extraocular movements intact Mouth: oral pharynx moist, no lesions Neck: supple no lymphadenopathy Cardiovascular: heart regular rate and rhythm Lungs: clear to auscultation bilaterally Abdomen: soft, nontender, nondistended, no obvious ascites, no peritoneal signs, normal bowel sounds Extremities: no lower extremity edema bilaterally Skin: no lesions on visible extremities   Assessment and plan: 80 y.o. female with alternating bowel habits constipation, diarrhea, abdominal cramping  I  suspect a lot of her abdominal discomforts are related to her altered bowel habits, she has chronic alternating with constipation and diarrhea.  She has had colonoscopy and upper endoscopy within the last 3 or 4 years through a different office, done in [facility].  We will try to get those records sent here for my review.  For now she is going to start Citrucel orange flavored powder fiber supplement on a daily basis and return to my office in 2 months to see if that is helping her bowel irregularity and hopefully in turn her abdominal cramping.  I see no reason for any further blood tests or imaging studies prior to then.   Please see the "Patient Instructions" section for addition details about the plan.   Owens Loffler, MD [facility] 12/24/2020, 10:08 AM  Cc: Horald Pollen, *  Total time on date of encounter was 45  minutes (this included time spent preparing to see the patient reviewing records; obtaining and/or reviewing separately obtained history; performing a medically appropriate exam and/or evaluation; counseling and educating the patient and family if present; ordering medications, tests or procedures if applicable; and documenting clinical information in the health record).

## 2020-12-24 NOTE — Patient Instructions (Addendum)
If you are age 78 or older, your body mass index should be between 23-30. Your Body mass index is 47.8 kg/m. If this is out of the aforementioned range listed, please consider follow up with your Primary Care Provider.  Please start taking citrucel (orange flavored) powder fiber supplement.  This may cause some bloating at first but that usually goes away. Begin with a small spoonful and work your way up to a large, heaping spoonful daily over a week.  You will follow up in our office on 02-27-2021 at 9:50am.  We will attempt to obtain records from previous gastroenterologist for our review.  Thank you for entrusting me with your care and choosing [facility].  Dr Ardis Hughs

## 2020-12-26 ENCOUNTER — Ambulatory Visit: Payer: Medicare Other | Admitting: Internal Medicine

## 2020-12-26 NOTE — Progress Notes (Signed)
  Subjective:  Patient ID: [name], female    DOB: 09-17-1941,  MRN: [id number]  80 y.o. female presents with at risk foot care with history of diabetic neuropathy and painful thick toenails that are difficult to trim. Pain interferes with ambulation. Aggravating factors include wearing enclosed shoe gear. Pain is relieved with periodic professional debridement.   Patient states she is having left knee and right shoulder pain and will be seeing Orthopedics for this today.  She also states her blood sugar was 139 mg/dl this morning. She is requesting diabetic shoes on today's visit.  PCP is Dr. Agustina Caroli and last visit was 12/12/2020.  Review of Systems: Negative except as noted in the HPI.   Allergies  Allergen Reactions  . Metformin Diarrhea    Objective:  There were no vitals filed for this visit. Constitutional Patient is a pleasant 80 y.o. Hispanic female morbidly obese in NAD.Marland Kitchen AAO x 3.  Vascular Capillary refill time to digits immediate b/l. Palpable pedal pulses b/l LE. Pedal hair present. Lower extremity skin temperature gradient within normal limits. No edema noted b/l lower extremities. No cyanosis or clubbing noted.  Neurologic Normal speech. Oriented to person, place, and time. Pt has subjective symptoms of neuropathy. Protective sensation intact 5/5 intact bilaterally with 10g monofilament b/l.  Dermatologic Pedal skin with normal turgor, texture and tone bilaterally. No open wounds bilaterally. No interdigital macerations bilaterally. Toenails 1-5 b/l elongated, discolored, dystrophic, thickened, crumbly with subungual debris and tenderness to dorsal palpation.  Orthopedic: Normal muscle strength 5/5 to all lower extremity muscle groups bilaterally. No pain crepitus or joint limitation noted with ROM b/l. Hallux valgus with bunion deformity noted b/l lower extremities. Hammertoes noted to the b/l lower extremities. Patient ambulates independent of any assistive  aids.   Hemoglobin A1C Latest Ref Rng & Units 11/29/2020 09/12/2020 03/28/2020 02/28/2020  HGBA1C 4.0 - 5.6 % 7.4(A) 7.7(H) 7.9(A) 7.8(A)  Some recent data might be hidden   Assessment:   1. Pain due to onychomycosis of toenails of both feet   2. Hallux valgus, acquired, bilateral   3. Acquired hammertoes of both feet   4. Type 2 diabetes mellitus with diabetic polyneuropathy, with long-term current use of insulin ([place])    Plan:  Patient was evaluated and treated and all questions answered.  Onychomycosis with pain -Nails palliatively debridement as below. -Educated on self-care  Procedure: Nail Debridement Rationale: Pain Type of Debridement: manual, sharp debridement. Instrumentation: Nail nipper, rotary burr. Number of Nails: 10  -Examined patient. Counseled on pedicures. -No new findings. No new orders. -Continue diabetic foot care principles. -Toenails 1-5 b/l were debrided in length and girth with sterile nail nippers and dremel without iatrogenic bleeding.  -Patient to report any pedal injuries to medical professional immediately. -Patient to continue soft, supportive shoe gear daily. Will schedule diabetic shoe appointment for her. She qualifies based on examination.  -Patient/POA to call should there be question/concern in the interim.  Return in about 3 months (around 03/21/2021).  Marzetta Board, DPM

## 2020-12-29 ENCOUNTER — Other Ambulatory Visit: Payer: Self-pay | Admitting: Emergency Medicine

## 2020-12-29 DIAGNOSIS — E1159 Type 2 diabetes mellitus with other circulatory complications: Secondary | ICD-10-CM

## 2020-12-29 DIAGNOSIS — I152 Hypertension secondary to endocrine disorders: Secondary | ICD-10-CM

## 2020-12-29 NOTE — Telephone Encounter (Signed)
Requested Prescriptions  Pending Prescriptions Disp Refills  . cloNIDine (CATAPRES) 0.3 MG tablet [Pharmacy Med Name: CLONIDINE 0.3MG  TABLETS] 180 tablet 0    Sig: TAKE 1 TABLET(0.3 MG) BY MOUTH TWICE DAILY     Cardiovascular:  Alpha-2 Agonists Passed - 12/29/2020 11:17 AM      Passed - Last BP in normal range    BP Readings from Last 1 Encounters:  12/24/20 106/60         Passed - Last Heart Rate in normal range    Pulse Readings from Last 1 Encounters:  12/24/20 64         Passed - Valid encounter within last 6 months    Recent Outpatient Visits          2 weeks ago Upper abdominal pain   [facility] at [facility], [place], MD   1 month ago Obesity, diabetes, and hypertension syndrome [facility])   [facility] at [facility], Ines Bloomer, MD   1 month ago Abdominal cramping   [facility] at [place], Ines Bloomer, MD   2 months ago Medicare annual wellness visit, subsequent   Primary Care at [facility], Ines Bloomer, MD   2 months ago Cough   Primary Care at [facility], Ines Bloomer, MD      Future Appointments            In 2 months [place], Ines Bloomer, MD [place] at [facility]

## 2020-12-31 ENCOUNTER — Institutional Professional Consult (permissible substitution): Payer: Medicare Other | Admitting: Pulmonary Disease

## 2021-01-02 NOTE — Progress Notes (Addendum)
01/03/21- 79 yoF never smoker for sleep evaluation courtesy of Dr Joellen Jersey Medical problem list includes dCHF, Aortic Atherosclerosis, HTN, PAFib/Amiodarone/Eliquis, Varicose Veins, Restrictive Lung Disease, GERD, Nonalcoholic Fatty Liver, Hypothyroid, DM2/ neuropathy, GOUT, Morbid Obesity,  NPSG 06/05/11-AHI 20.1/ hr, desaturation to  87%, body weight 261 lbs, CPAP 10 CPAP 5-15/ [employer] Download-   Compliance 30%, AHI 0.4/ hr                     Machine replaced 2017 Epworth score-6 Body weight today-253 lbs Covid vax-none Flu vax--none -----Formerly seen by Dr. Corrie Dandy in 2018 for OSA. Pt's spouse says that she is snoring over cpap Denies daytime sleepiness.Denies significant parasomnias. Seen in past by [facility] after hosp in [place] for CHF 4 years ago. No hx DVT/PE. Now cardiology follows. No home O2.  ENT surgery+tonsils. She is using and benefiting from CPAP. Reviewed download and goals.  PFT 03/25/17- moderately severe obstruction, insignificant response to BD, Nl TLC, DLCO mildly reduced. She would like [place] to be her pulmonary care group as well as sleep.  Denies cough/ wheeze. Easy DOE- chronic.  Prior to Admission medications   Medication Sig Start Date End Date Taking? Authorizing Provider  Accu-Chek Softclix Lancets lancets 1 each by Other route 3 (three) times daily. as directed 03/30/20  Yes Maximiano Coss, NP  allopurinol (ZYLOPRIM) 300 MG tablet Take 1 tablet (300 mg total) by mouth daily. 12/21/20  Yes Sagardia, Ines Bloomer, MD  amiodarone (PACERONE) 200 MG tablet Take 1 tablet (200 mg total) by mouth daily. 07/18/20  Yes Sherran Needs, NP  blood glucose meter kit and supplies Dispense based on patient and insurance preference. Use up to four times daily as directed. (FOR ICD-10 E10.9, E11.9). 05/03/20  Yes Sagardia, Ines Bloomer, MD  cloNIDine (CATAPRES) 0.3 MG tablet TAKE 1 TABLET(0.3 MG) BY MOUTH TWICE DAILY 12/29/20  Yes Sagardia, Ines Bloomer, MD  Continuous Blood  Gluc Receiver (FREESTYLE LIBRE 14 DAY READER) DEVI Use as directed. 04/18/20  Yes Sagardia, Ines Bloomer, MD  Continuous Blood Gluc Sensor (FREESTYLE LIBRE 14 DAY SENSOR) MISC Use as directed to check blood sugar daily 09/24/20  Yes [name], Melanie Crazier, MD  diltiazem (CARDIZEM) 30 MG tablet Take 30 mg by mouth. Take one tablet every 4 hours as needed for heart rate greater than 100 and blood pressure needs to be above 100   Yes Sherran Needs, NP  ELIQUIS 5 MG TABS tablet TAKE 1 TABLET(5 MG) BY MOUTH TWICE DAILY 12/14/20  Yes [name], MD  Insulin Lispro Prot & Lispro (HUMALOG 75/25 MIX) (75-25) 100 UNIT/ML Kwikpen Inject 15-21 Units into the skin with breakfast, with lunch, and with evening meal. Sliding scale insulin 08/12/16  Yes [provider]  levothyroxine (SYNTHROID, LEVOTHROID) 75 MCG tablet Take 75 mcg daily before breakfast by mouth.  12/29/16  Yes [provider]  lidocaine (LIDODERM) 5 % Place 1 patch onto the skin daily. Remove & Discard patch within 12 hours or as directed by MD 08/29/20  Yes Mcarthur Rossetti, MD  loperamide (IMODIUM) 2 MG capsule Take 1 capsule (2 mg total) by mouth 4 (four) times daily as needed for diarrhea or loose stools. 03/20/20  Yes Garald Balding, PA-C  losartan (COZAAR) 100 MG tablet Take 1 tablet (100 mg total) by mouth daily. 05/31/20  Yes Sagardia, Ines Bloomer, MD  NOVOFINE PLUS 32G X 4 MM MISC 1 each by Subdermal route 2 (two) times daily. as  directed 03/30/20  Yes Maximiano Coss, NP  [facility] VERIO test strip 1 each 3 (three) times daily. 06/08/20  Yes [provider]  potassium chloride (KLOR-CON) 10 MEQ tablet Take 2 tablets (20 mEq total) by mouth daily. 10/18/19  Yes [name], MD  RESTASIS MULTIDOSE 0.05 % ophthalmic emulsion Place 1 drop into both eyes 2 (two) times daily.  03/04/20  Yes [provider]  rosuvastatin (CRESTOR) 10 MG tablet Take 10 mg by mouth every evening.   Yes [provider]  Semaglutide,0.25 or 0.5MG/DOS, (OZEMPIC, 0.25 OR 0.5 MG/DOSE,) 2 MG/1.5ML SOPN Inject 0.75 mLs (1 mg total) into the skin once a week. 04/18/20  Yes Sagardia, Ines Bloomer, MD  tiZANidine (ZANAFLEX) 4 MG tablet Take 1 tablet (4 mg total) by mouth every 8 (eight) hours as needed for muscle spasms. 08/29/20  Yes Mcarthur Rossetti, MD  torsemide (DEMADEX) 20 MG tablet TAKE 2 TABLETS(40 MG) BY MOUTH DAILY 11/07/20  Yes [name], MD  triamcinolone cream (KENALOG) 0.1 % Apply 1 application topically 2 (two) times daily. 07/26/20  Yes Sagardia, Ines Bloomer, MD  verapamil (CALAN) 120 MG tablet Take 1 tablet (120 mg total) by mouth 2 (two) times daily. 08/15/20  Yes [name], MD   Past Medical History:  Diagnosis Date  . Arthritis   . Back pain   . Chronic anticoagulation    due to aflutter  . Chronic kidney disease   . Diabetes mellitus   . Diastolic CHF, chronic ([place])    a.  echo 2006 - ef 55-65%; mild diast dysfxn;    b. Echo 08/2011: Mild LVH, EF 60%;  c. 04/2013 Echo: EF 65-69%, mild conc LVH;  08/2014 Echo: EF 60-65%, mild-mod MR.  . Gout   . Hyperlipidemia   . Hypertension    a.  Renal arterial Dopplers 12/2011: 1-59% right renal artery stenosis  . Morbid obesity ([place])   . Obstructive sleep apnea on CPAP   . Paroxysmal Afib/Flutter    a. 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   Past Surgical History:  Procedure Laterality Date  . APPENDECTOMY    . ATRIAL FLUTTER ABLATION N/A 09/24/2011   Procedure: ATRIAL FLUTTER ABLATION;  Surgeon: [name], MD;  Location: [facility] CATH LAB;  Service: Cardiovascular;  Laterality: N/A;  . CARDIOVERSION  10/22/2011   Procedure: CARDIOVERSION;  Surgeon: [name], MD;  Location: [place];  Service: Cardiovascular;  Laterality: N/A;  . CARDIOVERSION N/A 09/10/2011   Procedure: CARDIOVERSION;  Surgeon: [name], MD;  Location: [facility] C[facility] LAB;  Service: Cardiovascular;  Laterality: N/A;  . CHOLECYSTECTOMY    .  TONSILLECTOMY  1982  . TOTAL ABDOMINAL HYSTERECTOMY     Family History  Problem Relation Age of Onset  . Heart disease Father   . Hypertension Father   . Breast cancer Sister   . Cancer Sister        breast  . Colon cancer Neg Hx   . Esophageal cancer Neg Hx   . Pancreatic cancer Neg Hx   . Liver disease Neg Hx    Social History   Socioeconomic History  . Marital status: Married    Spouse name: Not on file  . Number of children: 3  . Years of education: Not on file  . Highest education level: Not on file  Occupational History  . Occupation: DISABILITY/housewife    Employer: RETIRED  Tobacco Use  . Smoking status: Never Smoker  . Smokeless  tobacco: Never Used  Vaping Use  . Vaping Use: Never used  Substance and Sexual Activity  . Alcohol use: No  . Drug use: No  . Sexual activity: Yes  Other Topics Concern  . Not on file  Social History Narrative  . Not on file   Social Determinants of Health   Financial Resource Strain: Not on file  Food Insecurity: Not on file  Transportation Needs: Not on file  Physical Activity: Not on file  Stress: Not on file  Social Connections: Not on file  Intimate Partner Violence: Not on file   ROS-see HPI   + = positive Constitutional:    weight loss, night sweats, fevers, chills, fatigue, lassitude. HEENT:    headaches, difficulty swallowing, tooth/dental problems, sore throat,       sneezing, itching, ear ache, nasal congestion, post nasal drip, snoring CV:    chest pain, orthopnea, PND, swelling in lower extremities, anasarca,                                   dizziness, palpitations Resp:   +shortness of breath with exertion or at rest.                productive cough,   non-productive cough, coughing up of blood.              change in color of mucus.  wheezing.   Skin:    rash or lesions. GI:  No-   heartburn, indigestion, abdominal pain, nausea, vomiting, diarrhea,                 change in bowel habits, loss of  appetite GU: dysuria, change in color of urine, no urgency or frequency.   flank pain. MS:   joint pain, stiffness, decreased range of motion, back pain. Neuro-     nothing unusual Psych:  change in mood or affect.  depression or anxiety.   memory loss.  OBJ- Physical Exam General- Alert, Oriented, Affect-appropriate, Distress- none acute, + morbidly obese, + rolling walker Skin- rash-none, lesions- none, excoriation- none Lymphadenopathy- none Head- atraumatic            Eyes- Gross vision intact, PERRLA, conjunctivae and secretions clear,             Ears- Hearing, canals-normal            Nose- Clear, no-Septal dev, mucus, polyps, erosion, perforation             Throat- Mallampati II-III , mucosa clear , drainage- none, tonsils- absent, + teeth Neck- flexible , trachea midline, no stridor , thyroid nl, carotid no bruit Chest - symmetrical excursion , unlabored           Heart/CV- RRR , + murmur 1-2+S , no gallop  , no rub, nl s1 s2                           - JVD+1, edema- none, stasis changes- none, varices- none           Lung- clear to P&A, wheeze- none, cough- none , dullness-none, rub- none           Chest wall-  Abd-  Br/ Gen/ Rectal- Not done, not indicated Extrem- cyanosis- none, clubbing, none, atrophy- none, strength- nl Neuro- grossly intact to observation

## 2021-01-03 ENCOUNTER — Ambulatory Visit (INDEPENDENT_AMBULATORY_CARE_PROVIDER_SITE_OTHER): Payer: Medicare Other

## 2021-01-03 ENCOUNTER — Ambulatory Visit (INDEPENDENT_AMBULATORY_CARE_PROVIDER_SITE_OTHER): Payer: Medicare Other | Admitting: Internal Medicine

## 2021-01-03 ENCOUNTER — Encounter: Payer: Self-pay | Admitting: Internal Medicine

## 2021-01-03 ENCOUNTER — Other Ambulatory Visit: Payer: Self-pay

## 2021-01-03 VITALS — BP 128/70 | HR 74 | Ht 61.0 in | Wt 253.6 lb

## 2021-01-03 DIAGNOSIS — Z9989 Dependence on other enabling machines and devices: Secondary | ICD-10-CM | POA: Diagnosis not present

## 2021-01-03 DIAGNOSIS — I5032 Chronic diastolic (congestive) heart failure: Secondary | ICD-10-CM | POA: Diagnosis not present

## 2021-01-03 DIAGNOSIS — I509 Heart failure, unspecified: Secondary | ICD-10-CM | POA: Diagnosis not present

## 2021-01-03 DIAGNOSIS — J984 Other disorders of lung: Secondary | ICD-10-CM

## 2021-01-03 DIAGNOSIS — G4733 Obstructive sleep apnea (adult) (pediatric): Secondary | ICD-10-CM | POA: Diagnosis not present

## 2021-01-03 DIAGNOSIS — I517 Cardiomegaly: Secondary | ICD-10-CM | POA: Diagnosis not present

## 2021-01-03 LAB — CBC WITH DIFFERENTIAL/PLATELET
Basophils Absolute: 0.1 10*3/uL (ref 0.0–0.1)
Basophils Relative: 0.4 % (ref 0.0–3.0)
Eosinophils Absolute: 0.2 10*3/uL (ref 0.0–0.7)
Eosinophils Relative: 1.8 % (ref 0.0–5.0)
HCT: 37.1 % (ref 36.0–46.0)
Hemoglobin: 12 g/dL (ref 12.0–15.0)
Lymphocytes Relative: 21.1 % (ref 12.0–46.0)
Lymphs Abs: 2.8 10*3/uL (ref 0.7–4.0)
MCHC: 32.3 g/dL (ref 30.0–36.0)
MCV: 83.8 fl (ref 78.0–100.0)
Monocytes Absolute: 1.3 10*3/uL — ABNORMAL HIGH (ref 0.1–1.0)
Monocytes Relative: 10.2 % (ref 3.0–12.0)
Neutro Abs: 8.7 10*3/uL — ABNORMAL HIGH (ref 1.4–7.7)
Neutrophils Relative %: 66.5 % (ref 43.0–77.0)
Platelets: 376 10*3/uL (ref 150.0–400.0)
RBC: 4.42 Mil/uL (ref 3.87–5.11)
RDW: 16.5 % — ABNORMAL HIGH (ref 11.5–15.5)
WBC: 13.2 10*3/uL — ABNORMAL HIGH (ref 4.0–10.5)

## 2021-01-03 LAB — BASIC METABOLIC PANEL
BUN: 15 mg/dL (ref 6–23)
CO2: 36 mEq/L — ABNORMAL HIGH (ref 19–32)
Calcium: 9.3 mg/dL (ref 8.4–10.5)
Chloride: 95 mEq/L — ABNORMAL LOW (ref 96–112)
Creatinine, Ser: 0.72 mg/dL (ref 0.40–1.20)
GFR: 80.75 mL/min (ref >60.00)
Glucose, Bld: 166 mg/dL — ABNORMAL HIGH (ref 70–99)
Potassium: 3.5 mEq/L (ref 3.5–5.1)
Sodium: 139 mEq/L (ref 135–145)

## 2021-01-03 LAB — BRAIN NATRIURETIC PEPTIDE: Pro B Natriuretic peptide (BNP): 74 pg/mL (ref 0.0–100.0)

## 2021-01-03 NOTE — Patient Instructions (Addendum)
Order- DME [employer]- please replace old CPAP machine. Ok to change to [place] if there will be a long wait. Auto 5-20, mask of choice, humidifier, supplies, airView, card.  Order- CXR   Dx CHF  Order- lab- CBC w diff, BMET, BNP,     Dx OSA, CHF  Please call if we can help

## 2021-01-04 ENCOUNTER — Encounter: Payer: Self-pay | Admitting: Internal Medicine

## 2021-01-04 NOTE — Assessment & Plan Note (Signed)
She is way over healthy weight. 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 Plan- consider Health weight and wellness. Encourage diet/ exercise.

## 2021-01-04 NOTE — Assessment & Plan Note (Signed)
Uncertain control. Followed by cardiology Plan- CXR, BNP

## 2021-01-04 NOTE — Assessment & Plan Note (Signed)
Not clear that she has lung disease other than OHS, complicated by CHF Plan- PFT, CXR, BNP, BMET,  CBC w diff

## 2021-01-04 NOTE — Assessment & Plan Note (Signed)
Benefits when used. Emphasized goals of therapy, compliance, role of weight. She is not a candidate for Inspire due to weight Plan- [employer] replace old machine, auto 5-20, mask of choice,humidifier, supplies, AirView/ card

## 2021-01-07 ENCOUNTER — Telehealth: Payer: Self-pay | Admitting: Internal Medicine

## 2021-01-07 NOTE — Telephone Encounter (Signed)
community message has been sent to [facility] with [employer].  Nothing further needed at this time.

## 2021-01-07 NOTE — Telephone Encounter (Signed)
Requested addendum added to body of note,

## 2021-01-07 NOTE — Telephone Encounter (Signed)
We received the order for new CPAP. However, to process this order we are in need of usage and benefit notes to provide to the insurance company. Please have the MD update the progress note if he is able to. If not we may need a new sleep study to qualify her for a new unit. Once we get the needed documents we will be able to continue the processing of this order. I have placed this order on hold at this time.   Thank you,   Demetrius Charity

## 2021-01-07 NOTE — Telephone Encounter (Signed)
Dr. Annamaria Boots, please see below request from [employer].  Insurance requires that usage and benefit is mentioned in last OV note. Please add to OV note.

## 2021-01-08 ENCOUNTER — Other Ambulatory Visit: Payer: Self-pay

## 2021-01-09 ENCOUNTER — Telehealth (INDEPENDENT_AMBULATORY_CARE_PROVIDER_SITE_OTHER): Payer: Medicare Other | Admitting: Emergency Medicine

## 2021-01-09 ENCOUNTER — Encounter: Payer: Self-pay | Admitting: Emergency Medicine

## 2021-01-09 ENCOUNTER — Telehealth: Payer: Self-pay | Admitting: Gastroenterology

## 2021-01-09 DIAGNOSIS — D72829 Elevated white blood cell count, unspecified: Secondary | ICD-10-CM | POA: Diagnosis not present

## 2021-01-09 NOTE — Progress Notes (Signed)
Telemedicine Encounter- SOAP NOTE Established Patient Patient: Home  Provider: Office     This telephone encounter was conducted with the patient's (or proxy's) verbal consent via audio telecommunications: yes/no: Yes Patient was instructed to have this encounter in a suitably private space; and to only have persons present to whom they give permission to participate. In addition, patient identity was confirmed by use of name plus two identifiers (DOB and address).  I discussed the limitations, risks, security and privacy concerns of performing an evaluation and management service by telephone and the availability of in person appointments. I also discussed with the patient that there may be a patient responsible charge related to this service. The patient expressed understanding and agreed to proceed.  I spent a total of TIME; 0 MIN TO 60 MIN: 10 minutes talking with the patient or their proxy.  Chief Complaint  Patient presents with  . labs request for elevated WBC     Per pulmonologist visit last week   . paperwork for walker     completed    Subjective   [name] is a 80 y.o. female established patient. Telephone visit today with question regarding recent CBC showing elevated white blood cell count.  Saw pulmonary doctor last week for sleep apnea evaluation.  Blood work done.  CBC showed white blood cell count of 13.2.  No fever or signs of infection.  Not on corticosteroids.  Rest of blood work unremarkable with acceptable values. No other complaints or medical concerns today.  HPI   Patient Active Problem List   Diagnosis Date Noted  . Sensorineural hearing loss (SNHL), 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  . Persistent atrial fibrillation ([place]) 08/12/2020  . Type 2 diabetes mellitus with hyperglycemia, with long-term current use of insulin ([place]) 06/21/2020  . Type 2 diabetes mellitus with diabetic polyneuropathy, with long-term current use of insulin ([place]) 06/21/2020  . Mixed  hyperlipidemia 06/21/2020  . Uncontrolled hypertension 06/11/2020  . Aortic atherosclerosis ([place]) 03/28/2020  . Diabetic peripheral neuropathy ([place]) 12/26/2019  . Intertrigo 10/23/2019  . Tinnitus of both ears 09/23/2019  . Right facial numbness 08/23/2019  . Right-sided headache 08/23/2019  . Tingling of face 06/21/2019  . Gastroesophageal reflux disease without esophagitis 03/17/2019  . Rash 03/17/2019  . Bilateral leg edema 02/16/2019  . LLQ pain 02/16/2019  . Pruritus 02/16/2019  . Recurrent cellulitis of lower extremity 02/16/2019  . Varicose veins of both legs with edema 02/16/2019  . Pain of right scapula 01/05/2019  . Heart murmur 09/05/2018  . Restrictive lung disease 02/22/2018  . Dysgeusia 06/11/2017  . Allergic dermatitis 06/03/2017  . Osteoarthritis of left knee 06/03/2017  . NAFLD (nonalcoholic fatty liver disease) 08/07/2016  . Osteoporosis 12/31/2015  . Obesity 12/19/2015  . Hypersomnia 12/19/2015  . Edema 10/18/2015  . Acquired hypothyroidism 10/18/2015  . Acute diastolic congestive heart failure, NYHA class 3 ([place]) 10/03/2013  . Prolonged QT interval 10/03/2013  . Acute diastolic heart failure ([place]) 10/03/2013  . Long term (current) use of anticoagulants 06/09/2012  . BENIGN NEOPLASM OF ADRENAL GLAND 11/25/2010  . Chronic diastolic heart failure ([place]) 02/20/2010  . DM 05/16/2009  . GOUT 05/16/2009  . OBESITY, MORBID 05/16/2009  . Hypertension associated with diabetes ([place]) 05/16/2009  . Paroxysmal atrial fibrillation ([place]) 05/16/2009  . HYPERLIPIDEMIA 11/30/2008  . Obstructive sleep apnea on CPAP 11/30/2008    Past Medical History:  Diagnosis Date  . Arthritis   . Back pain   . Chronic anticoagulation    due to  aflutter  . Chronic kidney disease   . Diabetes mellitus   . Diastolic CHF, chronic ([place])    a.  echo 2006 - ef 55-65%; mild diast dysfxn;    b. Echo 08/2011: Mild LVH, EF 60%;  c. 04/2013 Echo: EF 65-69%, mild conc LVH;  08/2014 Echo: EF  60-65%, mild-mod MR.  . Gout   . Hyperlipidemia   . Hypertension    a.  Renal arterial Dopplers 12/2011: 1-59% right renal artery stenosis  . Morbid obesity ([place])   . Obstructive sleep apnea on CPAP   . Paroxysmal Afib/Flutter    a. 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    Current Outpatient Medications  Medication Sig Dispense Refill  . Accu-Chek Softclix Lancets lancets 1 each by Other route 3 (three) times daily. as directed 100 each 3  . allopurinol (ZYLOPRIM) 300 MG tablet Take 1 tablet (300 mg total) by mouth daily. 30 tablet 0  . amiodarone (PACERONE) 200 MG tablet Take 1 tablet (200 mg total) by mouth daily. 90 tablet 3  . blood glucose meter kit and supplies Dispense based on patient and insurance preference. Use up to four times daily as directed. (FOR ICD-10 E10.9, E11.9). 1 each 0  . cloNIDine (CATAPRES) 0.3 MG tablet TAKE 1 TABLET(0.3 MG) BY MOUTH TWICE DAILY 180 tablet 0  . Continuous Blood Gluc Receiver (FREESTYLE LIBRE 14 DAY READER) DEVI Use as directed. 1 each 0  . Continuous Blood Gluc Sensor (FREESTYLE LIBRE 14 DAY SENSOR) MISC Use as directed to check blood sugar daily 2 each 3  . diltiazem (CARDIZEM) 30 MG tablet Take 30 mg by mouth. Take one tablet every 4 hours as needed for heart rate greater than 100 and blood pressure needs to be above 100    . ELIQUIS 5 MG TABS tablet TAKE 1 TABLET(5 MG) BY MOUTH TWICE DAILY 180 tablet 1  . Insulin Lispro Prot & Lispro (HUMALOG 75/25 MIX) (75-25) 100 UNIT/ML Kwikpen Inject 15-21 Units into the skin with breakfast, with lunch, and with evening meal. Sliding scale insulin    . levothyroxine (SYNTHROID, LEVOTHROID) 75 MCG tablet Take 75 mcg daily before breakfast by mouth.     . lidocaine (LIDODERM) 5 % Place 1 patch onto the skin daily. Remove & Discard patch within 12 hours or as directed by MD 30 patch 0  . loperamide (IMODIUM) 2 MG capsule Take 1 capsule (2 mg total) by mouth 4 (four) times daily as needed for diarrhea or loose  stools. 12 capsule 0  . losartan (COZAAR) 100 MG tablet Take 1 tablet (100 mg total) by mouth daily. 90 tablet 3  . NOVOFINE PLUS 32G X 4 MM MISC 1 each by Subdermal route 2 (two) times daily. as directed 60 each 3  . ONETOUCH VERIO test strip 1 each 3 (three) times daily.    . potassium chloride (KLOR-CON) 10 MEQ tablet Take 2 tablets (20 mEq total) by mouth daily. 180 tablet 3  . RESTASIS MULTIDOSE 0.05 % ophthalmic emulsion Place 1 drop into both eyes 2 (two) times daily.     . rosuvastatin (CRESTOR) 10 MG tablet Take 10 mg by mouth every evening.    . Semaglutide,0.25 or 0.5MG/DOS, (OZEMPIC, 0.25 OR 0.5 MG/DOSE,) 2 MG/1.5ML SOPN Inject 0.75 mLs (1 mg total) into the skin once a week. 12 pen 3  . tiZANidine (ZANAFLEX) 4 MG tablet Take 1 tablet (4 mg total) by mouth every 8 (eight) hours as needed for muscle spasms. 60 tablet 1  .  torsemide (DEMADEX) 20 MG tablet TAKE 2 TABLETS(40 MG) BY MOUTH DAILY 180 tablet 3  . triamcinolone cream (KENALOG) 0.1 % Apply 1 application topically 2 (two) times daily. 30 g 0  . verapamil (CALAN) 120 MG tablet Take 1 tablet (120 mg total) by mouth 2 (two) times daily. 180 tablet 3   No current facility-administered medications for this visit.    Allergies  Allergen Reactions  . Metformin Diarrhea    Social History   Socioeconomic History  . Marital status: Married    Spouse name: Not on file  . Number of children: 3  . Years of education: Not on file  . Highest education level: Not on file  Occupational History  . Occupation: DISABILITY/housewife    Employer: RETIRED  Tobacco Use  . Smoking status: Never Smoker  . Smokeless tobacco: Never Used  Vaping Use  . Vaping Use: Never used  Substance and Sexual Activity  . Alcohol use: No  . Drug use: No  . Sexual activity: Yes  Other Topics Concern  . Not on file  Social History Narrative  . Not on file   Social Determinants of Health   Financial Resource Strain: Not on file  Food Insecurity:  Not on file  Transportation Needs: Not on file  Physical Activity: Not on file  Stress: Not on file  Social Connections: Not on file  Intimate Partner Violence: Not on file    Review of Systems  Constitutional: Negative.  Negative for chills and fever.  HENT: Negative.  Negative for congestion, ear discharge, ear pain, sinus pain and sore throat.   Respiratory: Negative.  Negative for cough and shortness of breath.   Cardiovascular: Negative.  Negative for chest pain and palpitations.  Gastrointestinal: Negative.  Negative for abdominal pain, blood in stool, diarrhea, melena, nausea and vomiting.  Genitourinary: Negative.  Negative for dysuria and hematuria.  Musculoskeletal: Negative.  Negative for back pain, myalgias and neck pain.  Skin: Negative.   Neurological: Negative.  Negative for dizziness and headaches.  All other systems reviewed and are negative.   Objective  Alert and oriented x3 in no apparent respiratory distress. Vitals as reported by the patient: There were no vitals filed for this visit.  There are no diagnoses linked to this encounter.  Savera was seen today for labs request for elevated wbc  and paperwork for walker .  Diagnoses and all orders for this visit:  Leukocytosis, unspecified type    Clinically stable.  No medical concerns identified during this visit. Follow-up in the office as scheduled in 3 months.  I discussed the assessment and treatment plan with the patient. The patient was provided an opportunity to ask questions and all were answered. The patient agreed with the plan and demonstrated an understanding of the instructions.   The patient was advised to call back or seek an in-person evaluation if the symptoms worsen or if the condition fails to improve as anticipated.  I provided 10 minutes of non-face-to-face time during this encounter.  Horald Pollen, MD  Primary Care at [facility]

## 2021-01-09 NOTE — Telephone Encounter (Signed)
Received community from [place] with [facility].  Routing to Dr. Annamaria Boots as an Juluis Rainier.   Lulu Riding; Linwood Dibbles, CMA; [place], Melissa Hello,   I received the following email from our verification team and we wanted to keep you in the loop:   Documentation needed: Patient has a working machine so with the global shortage we cannot replace her machine at this time. I called her and explained the shortage, explained we would be back in touch with her as soon as we got sufficient stock and she hung up on me.  Can you please let [place] know since she confirmed she has a working machine we cannot do the replacement at this time but we will contact her as soon as we can do the replacement.   Thank you,   Demetrius Charity

## 2021-01-09 NOTE — Patient Instructions (Signed)
° ° ° °  If you have lab work done today you will be contacted with your lab results within the next 2 weeks.  If you have not heard from us then please contact us. The fastest way to get your results is to register for My Chart. ° ° °IF you received an x-ray today, you will receive an invoice from [facility]. Please contact [facility] at [phone] with questions or concerns regarding your invoice.  ° °IF you received labwork today, you will receive an invoice from [facility]. Please contact [facility] at [phone] with questions or concerns regarding your invoice.  ° °Our billing staff will not be able to assist you with questions regarding bills from these companies. ° °You will be contacted with the lab results as soon as they are available. The fastest way to get your results is to activate your My Chart account. Instructions are located on the last page of this paperwork. If you have not heard from us regarding the results in 2 weeks, please contact this office. °  ° ° ° °

## 2021-01-09 NOTE — Telephone Encounter (Signed)
The pt states she is sure she had a colon in the past 3-4 years. She is going to make some calls and get back to our office.

## 2021-01-09 NOTE — Telephone Encounter (Signed)
Thank you :)

## 2021-01-09 NOTE — Telephone Encounter (Signed)
Can you please contact her to clarify her medical history.  In my office she was fairly clear 2 or 3 weeks ago that she had undergone a colonoscopy and upper endoscopy in [place] in the last 2 or 3 years.  We did receive records from [facility] and I could see that she sat down with a gastroenterologist that in September 2018 and they discussed colon cancer screening.  She opted for Cologuard and it was negative.   Please ask her if she is sure whether or not she has undergone colonoscopy in the past 3 or 4 years and if she did wear was not done.  This could definitely change my recommendations.  Thank you

## 2021-01-10 ENCOUNTER — Encounter: Payer: Self-pay | Admitting: Internal Medicine

## 2021-01-10 ENCOUNTER — Other Ambulatory Visit: Payer: Self-pay

## 2021-01-10 ENCOUNTER — Ambulatory Visit (INDEPENDENT_AMBULATORY_CARE_PROVIDER_SITE_OTHER): Payer: Medicare Other | Admitting: Internal Medicine

## 2021-01-10 VITALS — BP 128/68 | HR 63 | Resp 20 | Ht 61.0 in | Wt 251.8 lb

## 2021-01-10 DIAGNOSIS — E1165 Type 2 diabetes mellitus with hyperglycemia: Secondary | ICD-10-CM | POA: Diagnosis not present

## 2021-01-10 DIAGNOSIS — R296 Repeated falls: Secondary | ICD-10-CM

## 2021-01-10 DIAGNOSIS — E782 Mixed hyperlipidemia: Secondary | ICD-10-CM | POA: Diagnosis not present

## 2021-01-10 DIAGNOSIS — Z794 Long term (current) use of insulin: Secondary | ICD-10-CM | POA: Diagnosis not present

## 2021-01-10 DIAGNOSIS — E1142 Type 2 diabetes mellitus with diabetic polyneuropathy: Secondary | ICD-10-CM | POA: Diagnosis not present

## 2021-01-10 MED ORDER — INSULIN LISPRO PROT & LISPRO (75-25 MIX) 100 UNIT/ML KWIKPEN: 15.0000 [IU] | PEN_INJECTOR | Freq: Two times a day (BID) | SUBCUTANEOUS | 3 refills | Status: DC

## 2021-01-10 NOTE — Progress Notes (Signed)
Name: [name]  Age/ Sex: 80 y.o., female   MRN/ DOB: [id number], 24-Jun-1941     PCP: Horald Pollen, MD   Reason for Endocrinology Evaluation: Type 2 Diabetes Mellitus  Initial Endocrine Consultative Visit: 06/21/2020    PATIENT IDENTIFIER: [name] is a 80 y.o. female with a past medical history of HTN, PAF, CHF, OSA and Dyslipidemia . The patient has followed with Endocrinology clinic since 06/21/2020 for consultative assistance with management of her diabetes.  DIABETIC HISTORY:  [name] was diagnosed with DM in 2011,she is intolerant to metformin due to diarrhea. Her hemoglobin A1c has ranged from 7.3% in 2011, peaking at 9.5% in 2014.  SUBJECTIVE:   During the last visit (09/24/2020): A1c 7.7 %, adjusted insulin and restarted Ozempic   Today (01/10/2021): [name]  She checks her blood sugars multiple times a day  times daily, through CGM . The patient has not had hypoglycemic episodes since the last clinic visit.   Denies nausea or diarrhea She stopped having PT ~ 2.5 months ago She continues with right shoulder pain, expecting an MRI  HOME DIABETES REGIMEN:  Humalog Mix 15 units with Breakfast and Supper  Ozempic 0.5 mg weekly       Statin: yes ACE-I/ARB: yes    CONTINUOUS GLUCOSE MONITORING RECORD INTERPRETATION    Dates of Recording: 3/11-3/14/2022  Sensor description: [employer]   Results statistics:   CGM use % of time 97  Average and SD 155/19.4  Time in range    81 %  % Time Above 180 18  % Time above 250 1  % Time Below target 0     Glycemic patterns summary: optimal BG's overnight slight hyperglycemia after supper Hyperglycemic episodes  Post lunch and supper   Hypoglycemic episodes occurred N/A  Overnight periods: Optimal           DIABETIC COMPLICATIONS: Microvascular complications:   Neuropathy  Denies: CKD, , retinopathy  Last eye exam: Completed 03/2020  Macrovascular complications:     Denies: CAD, PVD, CVA    HISTORY:  Past Medical History:  Past Medical History:  Diagnosis Date  . Arthritis   . Back pain   . Chronic anticoagulation    due to aflutter  . Chronic kidney disease   . Diabetes mellitus   . Diastolic CHF, chronic ([place])    a.  echo 2006 - ef 55-65%; mild diast dysfxn;    b. Echo 08/2011: Mild LVH, EF 60%;  c. 04/2013 Echo: EF 65-69%, mild conc LVH;  08/2014 Echo: EF 60-65%, mild-mod MR.  . Gout   . Hyperlipidemia   . Hypertension    a.  Renal arterial Dopplers 12/2011: 1-59% right renal artery stenosis  . Morbid obesity ([place])   . Obstructive sleep apnea on CPAP   . Paroxysmal Afib/Flutter    a. 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   Past Surgical History:  Past Surgical History:  Procedure Laterality Date  . APPENDECTOMY    . ATRIAL FLUTTER ABLATION N/A 09/24/2011   Procedure: ATRIAL FLUTTER ABLATION;  Surgeon: [name], MD;  Location[facility] CATH LAB;  Service: Cardiovascular;  Laterality: N/A;  . CARDIOVERSION  10/22/2011   Procedure: CARDIOVERSION;  Surgeon: [name], MD;  Location: [place];  Service: Cardiovascular;  Laterality: N/A;  . CARDIOVERSION N/A 09/10/2011   Procedure: CARDIOVERSION;  Surgeon: [name], MD;  Location: [facility] CATH LAB;  Service: Cardiovascular;  Laterality: N/A;  . CHOLECYSTECTOMY    .  TONSILLECTOMY  1982  . TOTAL ABDOMINAL HYSTERECTOMY      Social History:  reports that she has never smoked. She has never used smokeless tobacco. She reports that she does not drink alcohol and does not use drugs. Family History:  Family History  Problem Relation Age of Onset  . Heart disease Father   . Hypertension Father   . Breast cancer Sister   . Cancer Sister        breast  . Colon cancer Neg Hx   . Esophageal cancer Neg Hx   . Pancreatic cancer Neg Hx   . Liver disease Neg Hx      HOME MEDICATIONS: Allergies as of 01/10/2021      Reactions   Metformin Diarrhea      Medication List        Accurate as of January 10, 2021  8:56 AM. If you have any questions, ask your nurse or doctor.        Accu-Chek Softclix Lancets lancets 1 each by Other route 3 (three) times daily. as directed   allopurinol 300 MG tablet Commonly known as: ZYLOPRIM Take 1 tablet (300 mg total) by mouth daily.   amiodarone 200 MG tablet Commonly known as: PACERONE Take 1 tablet (200 mg total) by mouth daily.   blood glucose meter kit and supplies Dispense based on patient and insurance preference. Use up to four times daily as directed. (FOR ICD-10 E10.9, E11.9).   cloNIDine 0.3 MG tablet Commonly known as: CATAPRES TAKE 1 TABLET(0.3 MG) BY MOUTH TWICE DAILY   diltiazem 30 MG tablet Commonly known as: CARDIZEM Take 30 mg by mouth. Take one tablet every 4 hours as needed for heart rate greater than 100 and blood pressure needs to be above 100   Eliquis 5 MG Tabs tablet Generic drug: apixaban TAKE 1 TABLET(5 MG) BY MOUTH TWICE DAILY   FreeStyle Libre 14 Day Reader Kerrin Mo Use as directed.   FreeStyle Libre 14 Day Sensor Misc Use as directed to check blood sugar daily   Insulin Lispro Prot & Lispro (75-25) 100 UNIT/ML Kwikpen Commonly known as: HumaLOG Mix 75/25 KwikPen Inject 15 Units into the skin 2 (two) times daily. What changed:   how much to take  when to take this  additional instructions Changed by: Dorita Sciara, MD   levothyroxine 75 MCG tablet Commonly known as: SYNTHROID Take 75 mcg daily before breakfast by mouth.   lidocaine 5 % Commonly known as: Lidoderm Place 1 patch onto the skin daily. Remove & Discard patch within 12 hours or as directed by MD   loperamide 2 MG capsule Commonly known as: IMODIUM Take 1 capsule (2 mg total) by mouth 4 (four) times daily as needed for diarrhea or loose stools.   losartan 100 MG tablet Commonly known as: COZAAR Take 1 tablet (100 mg total) by mouth daily.   NovoFine Plus 32G X 4 MM Misc Generic drug: Insulin Pen  Needle 1 each by Subdermal route 2 (two) times daily. as directed   OneTouch Verio test strip Generic drug: glucose blood 1 each 3 (three) times daily.   Ozempic (0.25 or 0.5 MG/DOSE) 2 MG/1.5ML Sopn Generic drug: Semaglutide(0.25 or 0.5MG/DOS) Inject 0.75 mLs (1 mg total) into the skin once a week.   potassium chloride 10 MEQ tablet Commonly known as: KLOR-CON Take 2 tablets (20 mEq total) by mouth daily.   Restasis MultiDose 0.05 % ophthalmic emulsion Generic drug: cycloSPORINE Place 1 drop into both eyes 2 (  two) times daily.   rosuvastatin 10 MG tablet Commonly known as: CRESTOR Take 10 mg by mouth every evening.   tiZANidine 4 MG tablet Commonly known as: Zanaflex Take 1 tablet (4 mg total) by mouth every 8 (eight) hours as needed for muscle spasms.   torsemide 20 MG tablet Commonly known as: DEMADEX TAKE 2 TABLETS(40 MG) BY MOUTH DAILY   triamcinolone 0.1 % Commonly known as: KENALOG Apply 1 application topically 2 (two) times daily.   verapamil 120 MG tablet Commonly known as: Calan Take 1 tablet (120 mg total) by mouth 2 (two) times daily.        OBJECTIVE:   Vital Signs: BP 128/68 (BP Location: Left Arm, Patient Position: Sitting, Cuff Size: Normal)   Pulse 63   Resp 20   Ht _0  (1.549 m)   Wt 251 lb 12.8 oz (114.2 kg)   SpO2 98%   BMI 47.58 kg/m   Wt Readings from Last 3 Encounters:  01/10/21 251 lb 12.8 oz (114.2 kg)  01/03/21 253 lb 9.6 oz (115 kg)  12/24/20 253 lb (114.8 kg)     Exam: General: Pt appears well and is in NAD  Lungs: Clear with good BS bilat with no rales, rhonchi, or wheezes  Heart: RRR   Extremities: No pretibial edema.   Neuro: MS is good with appropriate affect, pt is alert and Ox3    DM foot exam: 9/2/221   The skin of the feet is intact without sores or ulcerations. The pedal pulses are 2+ on right and 2+ on left. The sensation is intact to a screening 5.07, 10 gram monofilament bilaterally         DATA  REVIEWED:  Lab Results  Component Value Date   HGBA1C 7.4 (A) 11/29/2020   HGBA1C 7.7 (H) 09/12/2020   HGBA1C 7.9 (A) 03/28/2020   Lab Results  Component Value Date   MICROALBUR 6.3 (H) 06/21/2020   LDLCALC 57 09/12/2020   CREATININE 0.72 01/03/2021   Lab Results  Component Value Date   MICRALBCREAT 6.2 06/21/2020     Lab Results  Component Value Date   CHOL 135 09/12/2020   HDL 62 09/12/2020   LDLCALC 57 09/12/2020   TRIG 84 09/12/2020   CHOLHDL 2.2 09/12/2020         ASSESSMENT / PLAN / RECOMMENDATIONS:   1) Type 2 Diabetes Mellitus, Sub-Optimally controlled, With Neuropathic  complications - Most recent A1c of 7.4 %. Goal A1c < 7.0 %.    - I have praised her on the improved glycemic control. 81 % of her readings are at goal   - Intolerant to Metformin  - NO changes today   MEDICATIONS: - Continue Humalog Mix 15 units with Breakfast and 15 units with Supper  - Continue Ozempic 0.5 mg weekly    EDUCATION / INSTRUCTIONS:  BG monitoring instructions: Patient is instructed to check her blood sugars 3 times a day, before meals .  Call [place] [facility] if: BG persistently < 70 . I reviewed the Rule of 15 for the treatment of hypoglycemia in detail with the patient. Literature supplied.    2) Diabetic complications:   Eye: Does not have known diabetic retinopathy.   Neuro/ Feet: Does have known diabetic peripheral neuropathy .   Renal: Patient does not have known baseline CKD. She   is on an ACEI/ARB at present.  3) Mixed Hyperlipidemia. : Patient is  On rosuvastatin 10 mg daily  , LDL and Tg  at goal      4) Recurrent falls :  - She is not on any vitamins, I will check Vitamin D and B12  - No phlebotomist today, she will return for labs     F/U in 4 months    Signed electronically by: Mack Guise, MD  [facility]  [address] [place], [place] [address] Phone:  [phone] FAX: [phone]   CC: Horald Pollen, MD [place] [address] Phone: [phone]  Fax: [phone]  Return to Endocrinology clinic as below: Future Appointments  Date Time Provider [place]  01/22/2021  2:10 PM [facility] MM 2 [facility]  02/27/2021  9:50 AM Milus Banister, MD [facility]  03/11/2021  2:40 PM Horald Pollen, MD [facility] None  03/29/2021  8:45 AM Marzetta Board, DPM [facility]  04/05/2021  9:00 AM Deneise Lever, MD [facility] None

## 2021-01-10 NOTE — Patient Instructions (Signed)
-   Keep up the Good Work ! - Humalog Mix 15 units with Breakfast and 15 units with Supper  - Continue Ozempic  0.5 mg weekly       HOW TO TREAT LOW BLOOD SUGARS (Blood sugar LESS THAN 70 MG/DL)  Please follow the RULE OF 15 for the treatment of hypoglycemia treatment (when your (blood sugars are less than 70 mg/dL)    STEP 1: Take 15 grams of carbohydrates when your blood sugar is low, which includes:   3-4 GLUCOSE TABS  OR  3-4 OZ OF JUICE OR REGULAR SODA OR  ONE TUBE OF GLUCOSE GEL     STEP 2: RECHECK blood sugar in 15 MINUTES STEP 3: If your blood sugar is still low at the 15 minute recheck --> then, go back to STEP 1 and treat AGAIN with another 15 grams of carbohydrates.

## 2021-01-14 ENCOUNTER — Other Ambulatory Visit: Payer: Medicare Other

## 2021-01-15 ENCOUNTER — Telehealth: Payer: Self-pay | Admitting: Emergency Medicine

## 2021-01-15 ENCOUNTER — Telehealth: Payer: Self-pay | Admitting: Internal Medicine

## 2021-01-15 NOTE — Telephone Encounter (Signed)
Patient states that when she saw Dr. Mitchel Honour he asked her if the Semaglutide,0.25 or 0.5MG /DOS, (OZEMPIC, 0.25 OR 0.5 MG/DOSE,) 2 MG/1.5ML SOPN gave her goosebumps and she forgot to tell him that it did.

## 2021-01-15 NOTE — Telephone Encounter (Signed)
Spoke to patient she stated her Pulmonology doctor did labs, WBCs were elevated. She wants to discuss problem with Ozempic, appt was made for 01/23/2021 at 2:20 pm.

## 2021-01-15 NOTE — Telephone Encounter (Signed)
Called and spoke with pt to see if she knew the name of the rep from [employer] that she spoke with and she stated that she did not know the name of that person. Pt stated that the rep was very rude to her and cut her off while she was talking to her.  Asked pt how old her current cpap machine was and she stated that it was at least 76 or 80 years old. Stated to pt that we would follow up on this for her and she verbalized understanding.  Attempted to call Melissa with [employer] but unable to reach. Left message for her to return call.

## 2021-01-16 NOTE — Telephone Encounter (Signed)
Spoke with Melissa from [place] who looked into encounter with pt on 01/09/21. Melissa stated at this time there were no new C-Paps available and when more did become available [employer] would reach out to pt. Also spoke with pt and reviewed Melissa message with her. Pt stated understanding. Nothing further needed at this time.

## 2021-01-16 NOTE — Telephone Encounter (Signed)
Melissa from [place] returning a phone call. Melissa can be reached at [phone]

## 2021-01-20 ENCOUNTER — Other Ambulatory Visit: Payer: Self-pay | Admitting: Emergency Medicine

## 2021-01-20 DIAGNOSIS — M109 Gout, unspecified: Secondary | ICD-10-CM

## 2021-01-21 ENCOUNTER — Telehealth: Payer: Self-pay | Admitting: Emergency Medicine

## 2021-01-21 ENCOUNTER — Other Ambulatory Visit: Payer: Self-pay | Admitting: *Deleted

## 2021-01-21 DIAGNOSIS — E1159 Type 2 diabetes mellitus with other circulatory complications: Secondary | ICD-10-CM

## 2021-01-21 DIAGNOSIS — I152 Hypertension secondary to endocrine disorders: Secondary | ICD-10-CM

## 2021-01-21 MED ORDER — CLONIDINE HCL 0.3 MG PO TABS: ORAL_TABLET | ORAL | 3 refills | Status: DC

## 2021-01-21 NOTE — Telephone Encounter (Signed)
allopurinol (ZYLOPRIM) 300 MG tablet levothyroxine (SYNTHROID, LEVOTHROID) 75 MCG tablet [place], [address], [address] Phone:  [phone]  Fax:  [phone]     Pharmacy calling requesting refills

## 2021-01-21 NOTE — Telephone Encounter (Signed)
[facility] called for verbal orders for the refill.   Please advise.

## 2021-01-22 ENCOUNTER — Ambulatory Visit: Payer: Medicare Other

## 2021-01-23 ENCOUNTER — Other Ambulatory Visit: Payer: Self-pay

## 2021-01-23 ENCOUNTER — Ambulatory Visit (INDEPENDENT_AMBULATORY_CARE_PROVIDER_SITE_OTHER): Payer: Medicare Other | Admitting: Emergency Medicine

## 2021-01-23 ENCOUNTER — Encounter: Payer: Self-pay | Admitting: Emergency Medicine

## 2021-01-23 VITALS — BP 130/60 | HR 64 | Temp 98.6°F | Ht 61.0 in | Wt 254.2 lb

## 2021-01-23 DIAGNOSIS — R296 Repeated falls: Secondary | ICD-10-CM

## 2021-01-23 DIAGNOSIS — I700 Atherosclerosis of aorta: Secondary | ICD-10-CM

## 2021-01-23 DIAGNOSIS — I4819 Other persistent atrial fibrillation: Secondary | ICD-10-CM

## 2021-01-23 DIAGNOSIS — E1159 Type 2 diabetes mellitus with other circulatory complications: Secondary | ICD-10-CM

## 2021-01-23 DIAGNOSIS — E782 Mixed hyperlipidemia: Secondary | ICD-10-CM | POA: Diagnosis not present

## 2021-01-23 DIAGNOSIS — E1142 Type 2 diabetes mellitus with diabetic polyneuropathy: Secondary | ICD-10-CM | POA: Diagnosis not present

## 2021-01-23 DIAGNOSIS — I152 Hypertension secondary to endocrine disorders: Secondary | ICD-10-CM | POA: Diagnosis not present

## 2021-01-23 LAB — COMPREHENSIVE METABOLIC PANEL
ALT: 13 U/L (ref 0–35)
AST: 16 U/L (ref 0–37)
Albumin: 3.9 g/dL (ref 3.5–5.2)
Alkaline Phosphatase: 110 U/L (ref 39–117)
BUN: 16 mg/dL (ref 6–23)
CO2: 34 mEq/L — ABNORMAL HIGH (ref 19–32)
Calcium: 9.4 mg/dL (ref 8.4–10.5)
Chloride: 98 mEq/L (ref 96–112)
Creatinine, Ser: 0.8 mg/dL (ref 0.40–1.20)
GFR: 71.14 mL/min (ref >60.00)
Glucose, Bld: 105 mg/dL — ABNORMAL HIGH (ref 70–99)
Potassium: 3.4 mEq/L — ABNORMAL LOW (ref 3.5–5.1)
Sodium: 139 mEq/L (ref 135–145)
Total Bilirubin: 0.5 mg/dL (ref 0.2–1.2)
Total Protein: 7.3 g/dL (ref 6.0–8.3)

## 2021-01-23 LAB — CBC WITH DIFFERENTIAL/PLATELET
Basophils Absolute: 0 10*3/uL (ref 0.0–0.1)
Basophils Relative: 0.2 % (ref 0.0–3.0)
Eosinophils Absolute: 0.3 10*3/uL (ref 0.0–0.7)
Eosinophils Relative: 2.1 % (ref 0.0–5.0)
HCT: 37.4 % (ref 36.0–46.0)
Hemoglobin: 11.9 g/dL — ABNORMAL LOW (ref 12.0–15.0)
Lymphocytes Relative: 22.3 % (ref 12.0–46.0)
Lymphs Abs: 3 10*3/uL (ref 0.7–4.0)
MCHC: 31.9 g/dL (ref 30.0–36.0)
MCV: 83.2 fl (ref 78.0–100.0)
Monocytes Absolute: 1.3 10*3/uL — ABNORMAL HIGH (ref 0.1–1.0)
Monocytes Relative: 9.9 % (ref 3.0–12.0)
Neutro Abs: 8.9 10*3/uL — ABNORMAL HIGH (ref 1.4–7.7)
Neutrophils Relative %: 65.5 % (ref 43.0–77.0)
Platelets: 351 10*3/uL (ref 150.0–400.0)
RBC: 4.5 Mil/uL (ref 3.87–5.11)
RDW: 16.3 % — ABNORMAL HIGH (ref 11.5–15.5)
WBC: 13.6 10*3/uL — ABNORMAL HIGH (ref 4.0–10.5)

## 2021-01-23 LAB — VITAMIN D 25 HYDROXY (VIT D DEFICIENCY, FRACTURES): VITD: 23.37 ng/mL — ABNORMAL LOW (ref 30.00–100.00)

## 2021-01-23 LAB — VITAMIN B12: Vitamin B-12: 328 pg/mL (ref 211–911)

## 2021-01-23 MED ORDER — LEVOTHYROXINE SODIUM 75 MCG PO TABS: 75.0000 ug | ORAL_TABLET | Freq: Every day | ORAL | 1 refills | Status: DC

## 2021-01-23 NOTE — Telephone Encounter (Signed)
Medication Levothyroxine 75 mcg refilled to [facility].

## 2021-01-23 NOTE — Patient Instructions (Signed)
Mantenimiento de la salud despus de los 22 aos de edad Health Maintenance After Age 78 Despus de los 65 aos de edad, corre un riesgo mayor de Tourist information centre manager enfermedades e infecciones a Barrister's clerk, como tambin de sufrir lesiones por cadas. Las cadas son la causa principal de las fracturas de huesos y lesiones en la cabeza de personas mayores de 83 aos de edad. Recibir cuidados preventivos de forma regular puede ayudarlo a mantenerse saludable y en buen [place]. Los cuidados preventivos incluyen realizarse anlisis de forma regular y Actor en el estilo de vida segn las recomendaciones del mdico. Converse con el profesional que lo asiste sobre:  Las pruebas de deteccin y los anlisis que debe Dispensing optician. Una prueba de deteccin es un estudio que se para Hydrographic surveyor la presencia de una enfermedad cuando no tiene sntomas.  Un plan de dieta y ejercicios adecuado para usted. Qu debo saber sobre las pruebas de deteccin y los anlisis para prevenir cadas? Realizarse pruebas de deteccin y [employer] es la mejor manera de Hydrographic surveyor un problema de salud de forma temprana. El diagnstico y tratamiento tempranos le brindan la mejor oportunidad de Chief Technology Officer las afecciones mdicas que son comunes despus de los 77 aos de edad. Ciertas afecciones y elecciones de estilo de vida pueden hacer que sea ms propenso a sufrir Engineer, manufacturing. El mdico puede recomendarle lo siguiente:  Controles regulares de la visin. Una visin deficiente y afecciones como las cataratas pueden hacer que sea ms propenso a sufrir Engineer, manufacturing. Si [place] lentes, asegrese de obtener una receta actualizada si su visin cambia.  Revisin de medicamentos. Revise regularmente con el mdico todos los medicamentos que toma, incluidos los medicamentos de [place]. Consulte al [employer] efectos secundarios que pueden hacer que sea ms propenso a sufrir Engineer, manufacturing. Informe al mdico si alguno de los medicamentos que toma lo hace  sentir mareado o somnoliento.  Pruebas de deteccin para la osteoporosis. La osteoporosis es una afeccin que hace que los huesos se vuelvan ms frgiles. En consecuencia, los huesos pueden debilitarse y quebrarse ms fcilmente.  Pruebas de deteccin para la presin arterial. Los cambios en la presin arterial y los medicamentos para Chief Technology Officer la presin arterial pueden hacerlo sentir mareado.  Controles de fuerza y equilibrio. El mdico puede recomendar ciertos estudios para controlar su fuerza y equilibrio al estar de pie, al caminar o al cambiar de posicin.  Examen de los pies. El dolor y Chiropractor en los pies, como tambin no utilizar el calzado [place], pueden hacer que sea ms propenso a sufrir Engineer, manufacturing.  Prueba de deteccin de la depresin. Es ms probable que sufra una cada si tiene miedo a caerse, se siente mal emocionalmente o se siente incapaz de Patent examiner.  Prueba de deteccin de consumo de alcohol. Beber demasiado alcohol puede afectar su equilibrio y puede hacer que sea ms propenso a sufrir Engineer, manufacturing. Qu medidas puedo tomar para reducir mi riesgo de sufrir una cada? Instrucciones generales  Hable con el mdico sobre sus riesgos de sufrir una cada. Infrmele a su mdico si: ? Se cae. Asegrese de informarle a su mdico acerca de todas las cadas, incluso aquellas que parecen ser [employer] ? Se siente mareado, somnoliento o que pierde el equilibrio.  Tome los medicamentos de venta libre y los recetados solamente como se lo haya indicado el mdico. Estos incluyen todos los suplementos.  Siga una dieta sana y [place] un peso saludable. Una dieta saludable incluye  productos lcteos descremados, carnes bajas en contenido de grasa ([place], fibra de granos enteros, frijoles y [place] frutas y verduras. La seguridad en el hogar  Retire los objetos que puedan causar tropiezos tales como alfombras, cables u obstculos.  Instale equipos de  seguridad, como barras para sostn en los baos y barandas de seguridad en las escaleras.  [place] habitaciones y los pasillos bien iluminados. Actividad  Siga un programa de ejercicio regular para mantenerse en forma. Esto lo ayudar a Contractor equilibrio. Consulte al mdico qu tipos de ejercicios son adecuados para usted.  Si necesita un bastn o un andador, selo segn las recomendaciones del mdico.  Utilice calzado con buen apoyo y suela antideslizante.   Estilo de vida  No beba alcohol si el mdico le indica que no beba.  Si bebe alcohol, limite la cantidad que consume: ? De 0 a 1 medida por da para las mujeres. ? De 0 a 2 medidas por da para los hombres.  Est atento a la cantidad de alcohol que contiene su bebida. En los [place] una medida equivale a una botella tpica de cerveza (12 onzas), media copa de vino (5 onzas) o una medida de bebida blanca (1 onza).  No consuma ningn producto que contenga nicotina o tabaco, como cigarrillos y Psychologist, sport and exercise. Si necesita ayuda para dejar de fumar, consulte al mdico. Resumen  Tener un estilo de vida saludable y recibir cuidados preventivos pueden ayudar a Theatre stage manager salud y el bienestar despus de los 30 aos de [place].  Realizarse pruebas de deteccin y [employer] es la mejor manera de Hydrographic surveyor un problema de salud de forma temprana y Lourena Simmonds a Product/process development scientist una cada. El diagnstico y tratamiento tempranos le brindan la mejor oportunidad de Chief Technology Officer las afecciones mdicas ms comunes en las personas mayores de 76 aos de edad.  Las cadas son la causa principal de las fracturas de huesos y lesiones en la cabeza de personas mayores de 28 aos de edad. Tome precauciones para evitar una cada en su casa.  Trabaje con el mdico para saber qu cambios que puede hacer para mejorar su salud y [place], y [place]. Esta informacin no tiene Marine scientist el consejo del mdico. Asegrese de hacerle al  mdico cualquier pregunta que tenga. Document Revised: 11/19/2017 Document Reviewed: 11/19/2017 Elsevier Patient Education  2021 [employer].

## 2021-01-23 NOTE — Telephone Encounter (Signed)
Medication approved on 01/21/2021.

## 2021-01-23 NOTE — Addendum Note (Signed)
Addended by: Davina Poke on: 01/23/2021 04:42 PM   Modules accepted: Orders

## 2021-01-23 NOTE — Progress Notes (Signed)
[name] 80 y.o.   Chief Complaint  Patient presents with  . Medication Refill    Per patient review Ozempic    HISTORY OF PRESENT ILLNESS: This is a 80 y.o. female with multiple chronic medical problems here for follow-up and medication refill. Doing well.  Recently saw her endocrinologist and told her diabetes was doing very well. Cardiac conditions stable and well-controlled. Has no complaints or medical concerns today.  HPI   Prior to Admission medications   Medication Sig Start Date End Date Taking? Authorizing Provider  allopurinol (ZYLOPRIM) 300 MG tablet TAKE 1 TABLET(300 MG) BY MOUTH DAILY 01/21/21  Yes Azad Calame, Ines Bloomer, MD  amiodarone (PACERONE) 200 MG tablet Take 1 tablet (200 mg total) by mouth daily. 07/18/20  Yes Sherran Needs, NP  cloNIDine (CATAPRES) 0.3 MG tablet TAKE 1 TABLET(0.3 MG) BY MOUTH TWICE DAILY 01/21/21  Yes Jeremia Groot, Ines Bloomer, MD  diltiazem (CARDIZEM) 30 MG tablet Take 30 mg by mouth. Take one tablet every 4 hours as needed for heart rate greater than 100 and blood pressure needs to be above 100   Yes Sherran Needs, NP  ELIQUIS 5 MG TABS tablet TAKE 1 TABLET(5 MG) BY MOUTH TWICE DAILY 12/14/20  Yes [name], MD  Insulin Lispro Prot & Lispro (HUMALOG MIX 75/25 KWIKPEN) (75-25) 100 UNIT/ML Kwikpen Inject 15 Units into the skin 2 (two) times daily. 01/10/21  Yes [name], Melanie Crazier, MD  levothyroxine (SYNTHROID, LEVOTHROID) 75 MCG tablet Take 75 mcg daily before breakfast by mouth.  12/29/16  Yes [provider]  lidocaine (LIDODERM) 5 % Place 1 patch onto the skin daily. Remove & Discard patch within 12 hours or as directed by MD 08/29/20  Yes Mcarthur Rossetti, MD  loperamide (IMODIUM) 2 MG capsule Take 1 capsule (2 mg total) by mouth 4 (four) times daily as needed for diarrhea or loose stools. 03/20/20  Yes Garald Balding, PA-C  losartan (COZAAR) 100 MG tablet Take 1 tablet (100 mg total) by mouth daily. 05/31/20  Yes  Panzy Bubeck, Ines Bloomer, MD  potassium chloride (KLOR-CON) 10 MEQ tablet Take 2 tablets (20 mEq total) by mouth daily. 10/18/19  Yes [name], MD  RESTASIS MULTIDOSE 0.05 % ophthalmic emulsion Place 1 drop into both eyes 2 (two) times daily.  03/04/20  Yes [provider]  rosuvastatin (CRESTOR) 10 MG tablet Take 10 mg by mouth every evening.   Yes [provider]  Semaglutide,0.25 or 0.5MG/DOS, (OZEMPIC, 0.25 OR 0.5 MG/DOSE,) 2 MG/1.5ML SOPN Inject 0.75 mLs (1 mg total) into the skin once a week. 04/18/20  Yes Laruth Hanger, Ines Bloomer, MD  tiZANidine (ZANAFLEX) 4 MG tablet Take 1 tablet (4 mg total) by mouth every 8 (eight) hours as needed for muscle spasms. 08/29/20  Yes Mcarthur Rossetti, MD  torsemide (DEMADEX) 20 MG tablet TAKE 2 TABLETS(40 MG) BY MOUTH DAILY 11/07/20  Yes [name], MD  triamcinolone cream (KENALOG) 0.1 % Apply 1 application topically 2 (two) times daily. 07/26/20  Yes Kyah Buesing, Ines Bloomer, MD  verapamil (CALAN) 120 MG tablet Take 1 tablet (120 mg total) by mouth 2 (two) times daily. 08/15/20  Yes [name], MD  Accu-Chek Softclix Lancets lancets 1 each by Other route 3 (three) times daily. as directed 03/30/20   Maximiano Coss, NP  blood glucose meter kit and supplies Dispense based on patient and insurance preference. Use up to four times daily as directed. (FOR ICD-10 E10.9, E11.9). 05/03/20  Horald Pollen, MD  Continuous Blood Gluc Receiver (FREESTYLE LIBRE 14 DAY READER) DEVI Use as directed. 04/18/20   Horald Pollen, MD  Continuous Blood Gluc Sensor (FREESTYLE LIBRE 14 DAY SENSOR) MISC Use as directed to check blood sugar daily 09/24/20   [name], Melanie Crazier, MD  NOVOFINE PLUS 32G X 4 MM MISC 1 each by Subdermal route 2 (two) times daily. as directed 03/30/20   Maximiano Coss, NP  [facility] VERIO test strip 1 each 3 (three) times daily. 06/08/20   [provider]    Allergies  Allergen Reactions  .  Metformin Diarrhea    Patient Active Problem List   Diagnosis Date Noted  . Recurrent falls 01/10/2021  . Sensorineural hearing loss (SNHL), 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  . Persistent atrial fibrillation ([place]) 08/12/2020  . Type 2 diabetes mellitus with hyperglycemia, with long-term current use of insulin ([place]) 06/21/2020  . Type 2 diabetes mellitus with diabetic polyneuropathy, with long-term current use of insulin ([place]) 06/21/2020  . Mixed hyperlipidemia 06/21/2020  . Uncontrolled hypertension 06/11/2020  . Aortic atherosclerosis ([place]) 03/28/2020  . Diabetic peripheral neuropathy ([place]) 12/26/2019  . Intertrigo 10/23/2019  . Tinnitus of both ears 09/23/2019  . Right facial numbness 08/23/2019  . Right-sided headache 08/23/2019  . Tingling of face 06/21/2019  . Gastroesophageal reflux disease without esophagitis 03/17/2019  . Rash 03/17/2019  . Bilateral leg edema 02/16/2019  . LLQ pain 02/16/2019  . Pruritus 02/16/2019  . Recurrent cellulitis of lower extremity 02/16/2019  . Varicose veins of both legs with edema 02/16/2019  . Pain of right scapula 01/05/2019  . Heart murmur 09/05/2018  . Restrictive lung disease 02/22/2018  . Dysgeusia 06/11/2017  . Allergic dermatitis 06/03/2017  . Osteoarthritis of left knee 06/03/2017  . NAFLD (nonalcoholic fatty liver disease) 08/07/2016  . Osteoporosis 12/31/2015  . Obesity 12/19/2015  . Hypersomnia 12/19/2015  . Edema 10/18/2015  . Acquired hypothyroidism 10/18/2015  . Acute diastolic congestive heart failure, NYHA class 3 ([place]) 10/03/2013  . Prolonged QT interval 10/03/2013  . Acute diastolic heart failure ([place]) 10/03/2013  . Long term (current) use of anticoagulants 06/09/2012  . BENIGN NEOPLASM OF ADRENAL GLAND 11/25/2010  . Chronic diastolic heart failure ([place]) 02/20/2010  . DM 05/16/2009  . GOUT 05/16/2009  . OBESITY, MORBID 05/16/2009  . Hypertension associated with diabetes ([place]) 05/16/2009  . Paroxysmal atrial fibrillation  ([place]) 05/16/2009  . HYPERLIPIDEMIA 11/30/2008  . Obstructive sleep apnea on CPAP 11/30/2008    Past Medical History:  Diagnosis Date  . Arthritis   . Back pain   . Chronic anticoagulation    due to aflutter  . Chronic kidney disease   . Diabetes mellitus   . Diastolic CHF, chronic ([place])    a.  echo 2006 - ef 55-65%; mild diast dysfxn;    b. Echo 08/2011: Mild LVH, EF 60%;  c. 04/2013 Echo: EF 65-69%, mild conc LVH;  08/2014 Echo: EF 60-65%, mild-mod MR.  . Gout   . Hyperlipidemia   . Hypertension    a.  Renal arterial Dopplers 12/2011: 1-59% right renal artery stenosis  . Morbid obesity ([place])   . Obstructive sleep apnea on CPAP   . Paroxysmal Afib/Flutter    a. 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 APPENDECTOMY    . ATRIAL FLUTTER ABLATION N/A 09/24/2011   Procedure: ATRIAL FLUTTER ABLATION;  Surgeon: [name], MD;  Location: [facility] CATH LAB;  Service: Cardiovascular;  Laterality: N/A;  . CARDIOVERSION  10/22/2011   Procedure: CARDIOVERSION;  Surgeon: [name], MD;  Location: [place];  Service: Cardiovascular;  Laterality: N/A;  . CARDIOVERSION N/A 09/10/2011   Procedure: CARDIOVERSION;  Surgeon: [name], MD;  Location: [facility] CATH LAB;  Service: Cardiovascular;  Laterality: N/A;  . CHOLECYSTECTOMY    . TONSILLECTOMY  1982  . TOTAL ABDOMINAL HYSTERECTOMY      Social History   Socioeconomic History  . Marital status: Married    Spouse name: Not on file  . Number of children: 3  . Years of education: Not on file  . Highest education level: Not on file  Occupational History  . Occupation: DISABILITY/housewife    Employer: RETIRED  Tobacco Use  . Smoking status: Never Smoker  . Smokeless tobacco: Never Used  Vaping Use  . Vaping Use: Never used  Substance and Sexual Activity  . Alcohol use: No  . Drug use: No  . Sexual activity: Yes  Other Topics Concern  . Not on file  Social History Narrative  .  Not on file   Social Determinants of Health   Financial Resource Strain: Not on file  Food Insecurity: Not on file  Transportation Needs: Not on file  Physical Activity: Not on file  Stress: Not on file  Social Connections: Not on file  Intimate Partner Violence: Not on file    Family History  Problem Relation Age of Onset  . Heart disease Father   . Hypertension Father   . Breast cancer Sister   . Cancer Sister        breast  . Colon cancer Neg Hx   . Esophageal cancer Neg Hx   . Pancreatic cancer Neg Hx   . Liver disease Neg Hx      Review of Systems  Constitutional: Negative.  Negative for chills and fever.  HENT: Negative.  Negative for congestion and sore throat.   Respiratory: Negative.  Negative for cough and shortness of breath.   Cardiovascular: Negative for chest pain and palpitations.  Gastrointestinal: Negative.  Negative for abdominal pain, diarrhea, nausea and vomiting.  Genitourinary: Negative.  Negative for dysuria and hematuria.  Skin: Negative.  Negative for rash.  Neurological: Negative.  Negative for dizziness and headaches.  All other systems reviewed and are negative.    Physical Exam Vitals reviewed.  Constitutional:      Appearance: She is obese.  HENT:     Head: Normocephalic.  Eyes:     Extraocular Movements: Extraocular movements intact.     Pupils: Pupils are equal, round, and reactive to light.  Cardiovascular:     Rate and Rhythm: Normal rate and regular rhythm.     Pulses: Normal pulses.     Heart sounds: Normal heart sounds.  Pulmonary:     Effort: Pulmonary effort is normal.     Breath sounds: Normal breath sounds.  Musculoskeletal:     Cervical back: No tenderness.  Skin:    General: Skin is warm and dry.  Neurological:     General: No focal deficit present.     Mental Status: She is alert and oriented to person, place, and time.  Psychiatric:        Mood and Affect: Mood normal.        Behavior: Behavior normal.       ASSESSMENT & PLAN: Clinically stable.  No medical concerns identified during this visit. Continue present medications.  No changes. Follow-up as scheduled. Vernie Shanks  was seen today for medication refill.  Diagnoses and all orders for this visit:  Hypertension associated with diabetes ([place]) -     VITAMIN D 25 Hydroxy (Vit-D Deficiency, Fractures) -     Vitamin B12 -     CBC with Differential/Platelet -     Comprehensive metabolic panel  Aortic atherosclerosis (HCC)  Diabetic peripheral neuropathy (HCC)  Persistent atrial fibrillation ([place])  Mixed hyperlipidemia    Patient Instructions   Mantenimiento de la salud despus de los 68 aos de edad Health Maintenance After Age 66 Despus de los 65 aos de edad, corre un riesgo mayor de Tourist information centre manager enfermedades e infecciones a Barrister's clerk, como tambin de sufrir lesiones por cadas. Las cadas son la causa principal de las fracturas de huesos y lesiones en la cabeza de personas mayores de 58 aos de edad. Recibir cuidados preventivos de forma regular puede ayudarlo a mantenerse saludable y en buen [place]. Los cuidados preventivos incluyen realizarse anlisis de forma regular y Actor en el estilo de vida segn las recomendaciones del mdico. Converse con el profesional que lo asiste sobre:  Las pruebas de deteccin y los anlisis que debe Dispensing optician. Una prueba de deteccin es un estudio que se para Hydrographic surveyor la presencia de una enfermedad cuando no tiene sntomas.  Un plan de dieta y ejercicios adecuado para usted. Qu debo saber sobre las pruebas de deteccin y los anlisis para prevenir cadas? Realizarse pruebas de deteccin y [employer] es la mejor manera de Hydrographic surveyor un problema de salud de forma temprana. El diagnstico y tratamiento tempranos le brindan la mejor oportunidad de Chief Technology Officer las afecciones mdicas que son comunes despus de los 32 aos de edad. Ciertas afecciones y elecciones de estilo de vida pueden  hacer que sea ms propenso a sufrir Engineer, manufacturing. El mdico puede recomendarle lo siguiente:  Controles regulares de la visin. Una visin deficiente y afecciones como las cataratas pueden hacer que sea ms propenso a sufrir Engineer, manufacturing. Si [place] lentes, asegrese de obtener una receta actualizada si su visin cambia.  Revisin de medicamentos. Revise regularmente con el mdico todos los medicamentos que toma, incluidos los medicamentos de [place]. Consulte al [employer] efectos secundarios que pueden hacer que sea ms propenso a sufrir Engineer, manufacturing. Informe al mdico si alguno de los medicamentos que toma lo hace sentir mareado o somnoliento.  Pruebas de deteccin para la osteoporosis. La osteoporosis es una afeccin que hace que los huesos se vuelvan ms frgiles. En consecuencia, los huesos pueden debilitarse y quebrarse ms fcilmente.  Pruebas de deteccin para la presin arterial. Los cambios en la presin arterial y los medicamentos para Chief Technology Officer la presin arterial pueden hacerlo sentir mareado.  Controles de fuerza y equilibrio. El mdico puede recomendar ciertos estudios para controlar su fuerza y equilibrio al estar de pie, al caminar o al cambiar de posicin.  Examen de los pies. El dolor y Chiropractor en los pies, como tambin no utilizar el calzado [place], pueden hacer que sea ms propenso a sufrir Engineer, manufacturing.  Prueba de deteccin de la depresin. Es ms probable que sufra una cada si tiene miedo a caerse, se siente mal emocionalmente o se siente incapaz de Patent examiner.  Prueba de deteccin de consumo de alcohol. Beber demasiado alcohol puede afectar su equilibrio y puede hacer que sea ms propenso a sufrir Engineer, manufacturing. Qu medidas puedo tomar para reducir mi riesgo de sufrir una cada? Instrucciones generales  Hable con el mdico  sobre sus riesgos de sufrir una cada. Infrmele a su mdico si: ? Se cae. Asegrese de informarle a su mdico acerca de  todas las cadas, incluso aquellas que parecen ser [employer] ? Se siente mareado, somnoliento o que pierde el equilibrio.  Tome los medicamentos de venta libre y los recetados solamente como se lo haya indicado el mdico. Estos incluyen todos los suplementos.  Siga una dieta sana y [place] un peso saludable. Una dieta saludable incluye productos lcteos descremados, carnes bajas en contenido de grasa ([place], [place] de granos enteros, frijoles y [place] frutas y verduras. La seguridad en el hogar  Retire los objetos que puedan causar tropiezos tales como alfombras, cables u obstculos.  Instale equipos de seguridad, como barras para sostn en los baos y barandas de seguridad en las escaleras.  [place] habitaciones y los pasillos bien iluminados. Actividad  Siga un programa de ejercicio regular para mantenerse en forma. Esto lo ayudar a Contractor equilibrio. Consulte al mdico qu tipos de ejercicios son adecuados para usted.  Si necesita un bastn o un andador, selo segn las recomendaciones del mdico.  Utilice calzado con buen apoyo y suela antideslizante.   Estilo de vida  No beba alcohol si el mdico le indica que no beba.  Si bebe alcohol, limite la cantidad que consume: ? De 0 a 1 medida por da para las mujeres. ? De 0 a 2 medidas por da para los hombres.  Est atento a la cantidad de alcohol que contiene su bebida. En los [place] una medida equivale a una botella tpica de cerveza (12 onzas), media copa de vino (5 onzas) o una medida de bebida blanca (1 onza).  No consuma ningn producto que contenga nicotina o tabaco, como cigarrillos y Psychologist, sport and exercise. Si necesita ayuda para dejar de fumar, consulte al mdico. Resumen  Tener un estilo de vida saludable y recibir cuidados preventivos pueden ayudar a Theatre stage manager salud y el bienestar despus de los 35 aos de [place].  Realizarse pruebas de deteccin y [employer] es la mejor manera de Hydrographic surveyor un problema de  salud de forma temprana y Lourena Simmonds a Product/process development scientist una cada. El diagnstico y tratamiento tempranos le brindan la mejor oportunidad de Chief Technology Officer las afecciones mdicas ms comunes en las personas mayores de 47 aos de edad.  Las cadas son la causa principal de las fracturas de huesos y lesiones en la cabeza de personas mayores de 91 aos de edad. Tome precauciones para evitar una cada en su casa.  Trabaje con el mdico para saber qu cambios que puede hacer para mejorar su salud y [place], y [place]. Esta informacin no tiene Marine scientist el consejo del mdico. Asegrese de hacerle al mdico cualquier pregunta que tenga. Document Revised: 11/19/2017 Document Reviewed: 11/19/2017 Elsevier Patient Education  2021 [place], MD [facility]

## 2021-01-24 ENCOUNTER — Telehealth: Payer: Self-pay | Admitting: Emergency Medicine

## 2021-01-24 NOTE — Telephone Encounter (Signed)
Patient calling, wondering if we can send an order to [facility] for a bariatric walker with the four wheels and the seat

## 2021-01-25 ENCOUNTER — Other Ambulatory Visit (INDEPENDENT_AMBULATORY_CARE_PROVIDER_SITE_OTHER): Payer: Medicare Other

## 2021-01-25 ENCOUNTER — Other Ambulatory Visit: Payer: Self-pay

## 2021-01-25 DIAGNOSIS — R296 Repeated falls: Secondary | ICD-10-CM | POA: Diagnosis not present

## 2021-01-25 LAB — VITAMIN B12: Vitamin B-12: 327 pg/mL (ref 211–911)

## 2021-01-25 LAB — VITAMIN D 25 HYDROXY (VIT D DEFICIENCY, FRACTURES): VITD: 21.09 ng/mL — ABNORMAL LOW (ref 30.00–100.00)

## 2021-01-26 ENCOUNTER — Telehealth: Payer: Self-pay | Admitting: Emergency Medicine

## 2021-01-26 ENCOUNTER — Ambulatory Visit
Admission: RE | Admit: 2021-01-26 | Discharge: 2021-01-26 | Disposition: A | Discharge status: Home / Self Care | Payer: Medicare Other | Source: Ambulatory Visit | Attending: Orthopaedic Surgery | Admitting: Orthopaedic Surgery

## 2021-01-26 DIAGNOSIS — S46011A Strain of muscle(s) and tendon(s) of the rotator cuff of right shoulder, initial encounter: Secondary | ICD-10-CM | POA: Diagnosis not present

## 2021-01-26 DIAGNOSIS — G8929 Other chronic pain: Secondary | ICD-10-CM | POA: Diagnosis not present

## 2021-01-26 DIAGNOSIS — M19011 Primary osteoarthritis, right shoulder: Secondary | ICD-10-CM | POA: Diagnosis not present

## 2021-01-26 DIAGNOSIS — M7551 Bursitis of right shoulder: Secondary | ICD-10-CM | POA: Diagnosis not present

## 2021-01-26 DIAGNOSIS — Z96611 Presence of right artificial shoulder joint: Secondary | ICD-10-CM

## 2021-01-26 NOTE — Telephone Encounter (Signed)
Blood results discussed with patient. Chronically elevated white cell count of questionable clinical significance.  No clinical signs or symptoms of infection. Low vitamin D levels.  Advised to supplement daily with 1000 units of vitamin D.  Rest of blood work unremarkable.

## 2021-01-28 ENCOUNTER — Encounter: Payer: Self-pay | Admitting: Internal Medicine

## 2021-01-29 DIAGNOSIS — I10 Essential (primary) hypertension: Secondary | ICD-10-CM | POA: Diagnosis not present

## 2021-01-29 DIAGNOSIS — G4733 Obstructive sleep apnea (adult) (pediatric): Secondary | ICD-10-CM | POA: Diagnosis not present

## 2021-01-29 NOTE — Addendum Note (Signed)
Addended by: Alfredia Ferguson A on: 01/29/2021 11:49 AM   Modules accepted: Orders

## 2021-01-30 ENCOUNTER — Ambulatory Visit (INDEPENDENT_AMBULATORY_CARE_PROVIDER_SITE_OTHER): Payer: Medicare Other | Admitting: Orthopaedic Surgery

## 2021-01-30 ENCOUNTER — Other Ambulatory Visit: Payer: Self-pay

## 2021-01-30 ENCOUNTER — Encounter: Payer: Self-pay | Admitting: Orthopaedic Surgery

## 2021-01-30 DIAGNOSIS — M25511 Pain in right shoulder: Secondary | ICD-10-CM

## 2021-01-30 DIAGNOSIS — M75121 Complete rotator cuff tear or rupture of right shoulder, not specified as traumatic: Secondary | ICD-10-CM

## 2021-01-30 NOTE — Progress Notes (Signed)
The patient is a 80 year old female with multiple comorbidities and medical issues that comes in to go over an MRI of her right shoulder due to severe pain in the right shoulder.  She does ambulate with a walker.  Her BMI is 48.  She has chronic heart conditions and is on Eliquis.  She is diabetic and her hemoglobin A1c is continue to be over 7.  She is very slow to mobilize due to severe arthritis in her knees as well.  Steroid injections in the shoulder of causing a significant increase in her blood glucose.  She cannot take anti-inflammatories either.  The MRI of her right shoulder does show full-thickness rotator cuff tear this retracted about a centimeter.  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  This is certainly the source of pain and weakness of her shoulder.  I described to her the MRI findings.  She is not a candidate for surgery based on multiple issues including her weight combined with her diabetic control and her heart issues with being on Eliquis.  This can all be done under general anesthesia.  And even after surgery she can only get around with a walker usually so she would need to be in a sling and not use her walker and I would even create more issues.  I am just not really comfortable recommending surgery nor would be comfortable performing this based on the things I just mentioned.  I do feel it is worth trying outpatient physical therapy to work on anything that can decrease her right shoulder pain and work on the function of the shoulder as well as just her mobility in general.  We will work on getting that set up for any modalities per the therapist discretion on her right shoulder.  I can then see her back in 6 weeks to see how she is doing overall.

## 2021-01-30 NOTE — Telephone Encounter (Signed)
DME ordered  for bariatric walker with wheels and seat. When provider returns he will sign to be faxed.

## 2021-01-30 NOTE — Addendum Note (Signed)
Addended by: Alfredia Ferguson A on: 01/30/2021 11:48 AM   Modules accepted: Orders

## 2021-02-04 ENCOUNTER — Telehealth: Payer: Self-pay | Admitting: *Deleted

## 2021-02-04 ENCOUNTER — Ambulatory Visit (INDEPENDENT_AMBULATORY_CARE_PROVIDER_SITE_OTHER): Payer: Medicare Other | Admitting: Podiatry

## 2021-02-04 ENCOUNTER — Other Ambulatory Visit: Payer: Self-pay

## 2021-02-04 DIAGNOSIS — E1142 Type 2 diabetes mellitus with diabetic polyneuropathy: Secondary | ICD-10-CM

## 2021-02-04 DIAGNOSIS — Z794 Long term (current) use of insulin: Secondary | ICD-10-CM

## 2021-02-04 DIAGNOSIS — M2012 Hallux valgus (acquired), left foot: Secondary | ICD-10-CM

## 2021-02-04 DIAGNOSIS — M2011 Hallux valgus (acquired), right foot: Secondary | ICD-10-CM

## 2021-02-04 DIAGNOSIS — D689 Coagulation defect, unspecified: Secondary | ICD-10-CM

## 2021-02-04 NOTE — Telephone Encounter (Signed)
Faxed DME for Bariatric 4 wheeled rolling walker with seat. Confirmation rec'd.

## 2021-02-04 NOTE — Progress Notes (Signed)
Patient presented for foam casting for 3 pair custom diabetic shoe inserts. Patient is measured with a [place] Device to be a size 8 wide.  Diabetic shoes are chosen from the Safe step Catalog. The shoes chosen are 851.  The patient will be contacted when the shoes and inserts are ready to be picked up.

## 2021-02-15 NOTE — Telephone Encounter (Signed)
Patient calling, she spoke with [facility] and appealed for them to let her get the bariatric walker.  States we need to call [facility] with [facility] at [phone] If you have any questions call [facility] at [phone]

## 2021-02-19 ENCOUNTER — Encounter: Payer: Self-pay | Admitting: Physical Therapy

## 2021-02-19 ENCOUNTER — Telehealth: Payer: Self-pay | Admitting: Internal Medicine

## 2021-02-19 ENCOUNTER — Ambulatory Visit: Payer: Medicare Other | Attending: Orthopaedic Surgery | Admitting: Physical Therapy

## 2021-02-19 ENCOUNTER — Telehealth: Payer: Self-pay | Admitting: *Deleted

## 2021-02-19 ENCOUNTER — Other Ambulatory Visit: Payer: Self-pay

## 2021-02-19 DIAGNOSIS — R262 Difficulty in walking, not elsewhere classified: Secondary | ICD-10-CM | POA: Diagnosis not present

## 2021-02-19 DIAGNOSIS — M25611 Stiffness of right shoulder, not elsewhere classified: Secondary | ICD-10-CM

## 2021-02-19 DIAGNOSIS — M25511 Pain in right shoulder: Secondary | ICD-10-CM | POA: Insufficient documentation

## 2021-02-19 DIAGNOSIS — G8929 Other chronic pain: Secondary | ICD-10-CM | POA: Insufficient documentation

## 2021-02-19 DIAGNOSIS — M6281 Muscle weakness (generalized): Secondary | ICD-10-CM | POA: Diagnosis not present

## 2021-02-19 DIAGNOSIS — M25561 Pain in right knee: Secondary | ICD-10-CM | POA: Insufficient documentation

## 2021-02-19 DIAGNOSIS — M25562 Pain in left knee: Secondary | ICD-10-CM | POA: Insufficient documentation

## 2021-02-19 NOTE — Telephone Encounter (Signed)
   [facility] Pre-operative Risk Assessment    Patient Name: [name]  DOB: [birth year]  MRN: [id number]   HEARTCARE STAFF: - Please ensure there is not already an duplicate clearance open for this procedure. - Under Visit Info/Reason for Call, type in Other and utilize the format Clearance MM/DD/YY or Clearance TBD. Do not use dashes or single digits. - If request is for dental extraction, please clarify the # of teeth to be extracted.  Request for surgical clearance:  1. What type of surgery is being performed? Teeth cleaning  2. When is this surgery scheduled? 02/20/21  3. What type of clearance is required (medical clearance vs. Pharmacy clearance to hold med vs. Both)? both  4. Are there any medications that need to be held prior to surgery and how long? Eliquis, does she need to hold  5. Practice name and name of physician performing surgery? Dr. Quincy Simmonds DDS  6. What is the office phone number? [phone]   1.   What is the office fax number? [phone]  8.   Anesthesia type (None, local, MAC, general) ? no   Leah Newnam 02/19/2021, 9:44 AM  _________________________________________________________________   (provider comments below)

## 2021-02-19 NOTE — Therapy (Signed)
[address] [place], [place], [address] Phone: [phone]   Fax:  [phone]  Physical Therapy Evaluation  Patient Details  Name: [name] MRN: [id number] Date of Birth: Mar 16, 1941 Referring Provider (PT): Hedy Camara Date: 02/19/2021   PT End of Session - 02/19/21 0932    Visit Number 1    Date for PT Re-Evaluation 05/14/21    PT Start Time 0838    PT Stop Time 0922    PT Time Calculation (min) 44 min    Activity Tolerance Patient tolerated treatment well    Behavior During Therapy [facility] for tasks assessed/performed           Past Medical History:  Diagnosis Date  . Arthritis   . Back pain   . Chronic anticoagulation    due to aflutter  . Chronic kidney disease   . Diabetes mellitus   . Diastolic CHF, chronic ([place])    a.  echo 2006 - ef 55-65%; mild diast dysfxn;    b. Echo 08/2011: Mild LVH, EF 60%;  c. 04/2013 Echo: EF 65-69%, mild conc LVH;  08/2014 Echo: EF 60-65%, mild-mod MR.  . Gout   . Hyperlipidemia   . Hypertension    a.  Renal arterial Dopplers 12/2011: 1-59% right renal artery stenosis  . Morbid obesity ([place])   . Obstructive sleep apnea on CPAP   . Paroxysmal Afib/Flutter    a. 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 APPENDECTOMY    . ATRIAL FLUTTER ABLATION N/A 09/24/2011   Procedure: ATRIAL FLUTTER ABLATION;  Surgeon: [name], MD;  Location: [facility] CATH LAB;  Service: Cardiovascular;  Laterality: N/A;  . CARDIOVERSION  10/22/2011   Procedure: CARDIOVERSION;  Surgeon: [name], MD;  Location: [facility];  Service: Cardiovascular;  Laterality: N/A;  . CARDIOVERSION N/A 09/10/2011   Procedure: CARDIOVERSION;  Surgeon: [name], MD;  Location: [facility] CATH LAB;  Service: Cardiovascular;  Laterality: N/A;  . CHOLECYSTECTOMY    . TONSILLECTOMY  1982  . TOTAL ABDOMINAL HYSTERECTOMY      There were no vitals filed  for this visit.    Subjective Assessment - 02/19/21 0835    Subjective Pt reports that she has had R shoulder pain for months. States that she fell 3 times in September 2021; received [facility] PT for 3 months after the fall. They recommended that she reach out to MD for an MRI. MRI shows full thickness RC tear on R shoulder; pt states she is not a candidate for surgery d/t multiple comorbid conditions. Pt states that she has trouble lifting things, reaching overhead, and getting dressed d/t R shoulder limitations. Pt states that she has no pain at rest but increases with movement. Pt ambulates with RW d/t knee pain.    Pertinent History CKD, DM, CHF, HTN, afib    Limitations Lifting;House hold activities    Diagnostic tests MRI R shoulder    Currently in Pain? No/denies    Pain Score 0-No pain    Pain Location Shoulder    Pain Orientation Right    Pain Descriptors / Indicators Sharp;Stabbing    Pain Type Chronic pain    Pain Radiating Towards none    Pain Onset More than a month ago    Pain Frequency Intermittent    Aggravating Factors  reaching, lifting, carrying    Pain Relieving Factors rest, states she has  [address] [place], [place], [address] Phone: [phone]   Fax:  [phone]  Physical Therapy Evaluation  Patient Details  Name: [name] MRN: [id number] Date of Birth: Mar 16, 1941 Referring Provider (PT): Hedy Camara Date: 02/19/2021   PT End of Session - 02/19/21 0932    Visit Number 1    Date for PT Re-Evaluation 05/14/21    PT Start Time 0838    PT Stop Time 0922    PT Time Calculation (min) 44 min    Activity Tolerance Patient tolerated treatment well    Behavior During Therapy [facility] for tasks assessed/performed           Past Medical History:  Diagnosis Date  . Arthritis   . Back pain   . Chronic anticoagulation    due to aflutter  . Chronic kidney disease   . Diabetes mellitus   . Diastolic CHF, chronic ([place])    a.  echo 2006 - ef 55-65%; mild diast dysfxn;    b. Echo 08/2011: Mild LVH, EF 60%;  c. 04/2013 Echo: EF 65-69%, mild conc LVH;  08/2014 Echo: EF 60-65%, mild-mod MR.  . Gout   . Hyperlipidemia   . Hypertension    a.  Renal arterial Dopplers 12/2011: 1-59% right renal artery stenosis  . Morbid obesity ([place])   . Obstructive sleep apnea on CPAP   . Paroxysmal Afib/Flutter    a. 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 APPENDECTOMY    . ATRIAL FLUTTER ABLATION N/A 09/24/2011   Procedure: ATRIAL FLUTTER ABLATION;  Surgeon: [name], MD;  Location: [facility] CATH LAB;  Service: Cardiovascular;  Laterality: N/A;  . CARDIOVERSION  10/22/2011   Procedure: CARDIOVERSION;  Surgeon: [name], MD;  Location: [facility];  Service: Cardiovascular;  Laterality: N/A;  . CARDIOVERSION N/A 09/10/2011   Procedure: CARDIOVERSION;  Surgeon: [name], MD;  Location: [facility] CATH LAB;  Service: Cardiovascular;  Laterality: N/A;  . CHOLECYSTECTOMY    . TONSILLECTOMY  1982  . TOTAL ABDOMINAL HYSTERECTOMY      There were no vitals filed  for this visit.    Subjective Assessment - 02/19/21 0835    Subjective Pt reports that she has had R shoulder pain for months. States that she fell 3 times in September 2021; received [facility] PT for 3 months after the fall. They recommended that she reach out to MD for an MRI. MRI shows full thickness RC tear on R shoulder; pt states she is not a candidate for surgery d/t multiple comorbid conditions. Pt states that she has trouble lifting things, reaching overhead, and getting dressed d/t R shoulder limitations. Pt states that she has no pain at rest but increases with movement. Pt ambulates with RW d/t knee pain.    Pertinent History CKD, DM, CHF, HTN, afib    Limitations Lifting;House hold activities    Diagnostic tests MRI R shoulder    Currently in Pain? No/denies    Pain Score 0-No pain    Pain Location Shoulder    Pain Orientation Right    Pain Descriptors / Indicators Sharp;Stabbing    Pain Type Chronic pain    Pain Radiating Towards none    Pain Onset More than a month ago    Pain Frequency Intermittent    Aggravating Factors  reaching, lifting, carrying    Pain Relieving Factors rest, states she has  Muscle weakness (generalized)  Chronic pain of left knee  Difficulty in walking, not elsewhere classified     Problem List Patient Active Problem List   Diagnosis Date Noted  . Recurrent falls 01/10/2021  . Sensorineural hearing loss (SNHL), 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  . Persistent atrial fibrillation ([place]) 08/12/2020  . Type 2 diabetes mellitus with hyperglycemia, with long-term current use of insulin ([place]) 06/21/2020  . Type 2  diabetes mellitus with diabetic polyneuropathy, with long-term current use of insulin ([place]) 06/21/2020  . Mixed hyperlipidemia 06/21/2020  . Uncontrolled hypertension 06/11/2020  . Aortic atherosclerosis ([place]) 03/28/2020  . Diabetic peripheral neuropathy ([place]) 12/26/2019  . Intertrigo 10/23/2019  . Tinnitus of both ears 09/23/2019  . Right facial numbness 08/23/2019  . Right-sided headache 08/23/2019  . Tingling of face 06/21/2019  . Gastroesophageal reflux disease without esophagitis 03/17/2019  . Rash 03/17/2019  . Bilateral leg edema 02/16/2019  . LLQ pain 02/16/2019  . Pruritus 02/16/2019  . Recurrent cellulitis of lower extremity 02/16/2019  . Varicose veins of both legs with edema 02/16/2019  . Pain of right scapula 01/05/2019  . Heart murmur 09/05/2018  . Restrictive lung disease 02/22/2018  . Dysgeusia 06/11/2017  . Allergic dermatitis 06/03/2017  . Osteoarthritis of left knee 06/03/2017  . NAFLD (nonalcoholic fatty liver disease) 08/07/2016  . Osteoporosis 12/31/2015  . Obesity 12/19/2015  . Hypersomnia 12/19/2015  . Edema 10/18/2015  . Acquired hypothyroidism 10/18/2015  . Acute diastolic congestive heart failure, NYHA class 3 ([place]) 10/03/2013  . Prolonged QT interval 10/03/2013  . Acute diastolic heart failure ([place]) 10/03/2013  . Long term (current) use of anticoagulants 06/09/2012  . BENIGN NEOPLASM OF ADRENAL GLAND 11/25/2010  . Chronic diastolic heart failure ([place]) 02/20/2010  . DM 05/16/2009  . GOUT 05/16/2009  . OBESITY, MORBID 05/16/2009  . Hypertension associated with diabetes ([place]) 05/16/2009  . Paroxysmal atrial fibrillation ([place]) 05/16/2009  . HYPERLIPIDEMIA 11/30/2008  . Obstructive sleep apnea on CPAP 11/30/2008   Amador Cunas, PT, DPT Donald Prose Jonthan Leite 02/19/2021, 9:46 AM  [address] [place], [place], [address] Phone: [phone]   Fax:  [phone]  Name: [name] MRN:  [id number] Date of Birth: 05-Oct-1941  [address] [place], [place], [address] Phone: [phone]   Fax:  [phone]  Physical Therapy Evaluation  Patient Details  Name: [name] MRN: [id number] Date of Birth: Mar 16, 1941 Referring Provider (PT): Hedy Camara Date: 02/19/2021   PT End of Session - 02/19/21 0932    Visit Number 1    Date for PT Re-Evaluation 05/14/21    PT Start Time 0838    PT Stop Time 0922    PT Time Calculation (min) 44 min    Activity Tolerance Patient tolerated treatment well    Behavior During Therapy [facility] for tasks assessed/performed           Past Medical History:  Diagnosis Date  . Arthritis   . Back pain   . Chronic anticoagulation    due to aflutter  . Chronic kidney disease   . Diabetes mellitus   . Diastolic CHF, chronic ([place])    a.  echo 2006 - ef 55-65%; mild diast dysfxn;    b. Echo 08/2011: Mild LVH, EF 60%;  c. 04/2013 Echo: EF 65-69%, mild conc LVH;  08/2014 Echo: EF 60-65%, mild-mod MR.  . Gout   . Hyperlipidemia   . Hypertension    a.  Renal arterial Dopplers 12/2011: 1-59% right renal artery stenosis  . Morbid obesity ([place])   . Obstructive sleep apnea on CPAP   . Paroxysmal Afib/Flutter    a. 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 APPENDECTOMY    . ATRIAL FLUTTER ABLATION N/A 09/24/2011   Procedure: ATRIAL FLUTTER ABLATION;  Surgeon: [name], MD;  Location: [facility] CATH LAB;  Service: Cardiovascular;  Laterality: N/A;  . CARDIOVERSION  10/22/2011   Procedure: CARDIOVERSION;  Surgeon: [name], MD;  Location: [facility];  Service: Cardiovascular;  Laterality: N/A;  . CARDIOVERSION N/A 09/10/2011   Procedure: CARDIOVERSION;  Surgeon: [name], MD;  Location: [facility] CATH LAB;  Service: Cardiovascular;  Laterality: N/A;  . CHOLECYSTECTOMY    . TONSILLECTOMY  1982  . TOTAL ABDOMINAL HYSTERECTOMY      There were no vitals filed  for this visit.    Subjective Assessment - 02/19/21 0835    Subjective Pt reports that she has had R shoulder pain for months. States that she fell 3 times in September 2021; received [facility] PT for 3 months after the fall. They recommended that she reach out to MD for an MRI. MRI shows full thickness RC tear on R shoulder; pt states she is not a candidate for surgery d/t multiple comorbid conditions. Pt states that she has trouble lifting things, reaching overhead, and getting dressed d/t R shoulder limitations. Pt states that she has no pain at rest but increases with movement. Pt ambulates with RW d/t knee pain.    Pertinent History CKD, DM, CHF, HTN, afib    Limitations Lifting;House hold activities    Diagnostic tests MRI R shoulder    Currently in Pain? No/denies    Pain Score 0-No pain    Pain Location Shoulder    Pain Orientation Right    Pain Descriptors / Indicators Sharp;Stabbing    Pain Type Chronic pain    Pain Radiating Towards none    Pain Onset More than a month ago    Pain Frequency Intermittent    Aggravating Factors  reaching, lifting, carrying    Pain Relieving Factors rest, states she has

## 2021-02-19 NOTE — Telephone Encounter (Signed)
Patient called and said that the company handling her wheelchair has not received the order. She was wondering if it could be re faxed. Please advise    Fax: [phone]

## 2021-02-19 NOTE — Telephone Encounter (Signed)
Spoke to patient concerning the DME order for bariatric walker with rolling wheels and seat. The patient has spoke to [facility] and waiting on answer from the appeals process. Patient states she was told she had to weigh at least 300 pounds to get this type of walker.

## 2021-02-19 NOTE — Telephone Encounter (Signed)
Spoke to patient concerning the DME order for bariatric walker with wheels/seat. Patient states called [employer] and she is waiting on the answer from the appeals. Also, the patient has to weigh at least 300 pounds to be approved for a bariatric walker.

## 2021-02-19 NOTE — Telephone Encounter (Signed)
Patient Name: Ketina Belfiore  DOB 2041-09-10  Primary Cardiologist: Dr. Graciela Husbands  Chart reviewed as part of pre-operative protocol coverage.   Simple dental extractions are considered low risk procedures per guidelines and generally do not require any specific cardiac clearance. It is also generally accepted that for simple extractions and dental cleanings, there is no need to interrupt blood thinner therapy.  SBE prophylaxis is not required for the patient from a cardiac standpoint.   I will route this recommendation to the requesting party via Epic fax function and remove from pre-op pool.  Please call with questions.  Roe Rutherford Keneshia Tena, PA 02/19/2021, 11:20 AM

## 2021-02-20 ENCOUNTER — Telehealth: Payer: Self-pay | Admitting: Internal Medicine

## 2021-02-20 NOTE — Telephone Encounter (Signed)
Spoke with [name] who reports increased swelling in left foot and ankle over the last 2 days, some mild swelling in her right foot.  [name] states she is taking Torsemide and other medications as prescribed and is having a large amount of urine output.  Swelling in left foot and ankle did not go down overnight as it usually does.[name] denies weight gain, redness of extremity, CP or SOB.  She states she is elevating feet and legs while sitting and has not had any increase in Na+ intake.   She does not wear compression stockings. Educated [name] on hidden Na+ in foods and encouraged to continue Torsemide, elevating feet and legs.  Will forward information to Dr Olin Pia PA-C, Tommye Standard for review and recommendation.  [name] verbalizes understanding and agrees with current plan.

## 2021-02-20 NOTE — Telephone Encounter (Signed)
   Pt c/o swelling: STAT is pt has developed SOB within 24 hours  1) How much weight have you gained and in what time span?   2) If swelling, where is the swelling located? Left foot  3) Are you currently taking a fluid pill? Yes  4) Are you currently SOB? No  5) Do you have a log of your daily weights (if so, list)? None  6) Have you gained 3 pounds in a day or 5 pounds in a week? Pt said she weight the same  7) Have you traveled recently? No  Pt said her left foot been swollen for 3 days now, she said she is taking her fluid pill and because she has to go to the bathroom all the time she can't get enough sleep. She wanted to know what she can do to help with her foot but not have to pee all the time

## 2021-02-21 DIAGNOSIS — E119 Type 2 diabetes mellitus without complications: Secondary | ICD-10-CM | POA: Diagnosis not present

## 2021-02-21 DIAGNOSIS — H538 Other visual disturbances: Secondary | ICD-10-CM | POA: Diagnosis not present

## 2021-02-27 ENCOUNTER — Encounter: Payer: Self-pay | Admitting: Physical Therapy

## 2021-02-27 ENCOUNTER — Telehealth: Payer: Self-pay

## 2021-02-27 ENCOUNTER — Telehealth: Payer: Self-pay | Admitting: Emergency Medicine

## 2021-02-27 ENCOUNTER — Other Ambulatory Visit: Payer: Self-pay

## 2021-02-27 ENCOUNTER — Telehealth: Payer: Self-pay | Admitting: Podiatry

## 2021-02-27 ENCOUNTER — Ambulatory Visit (INDEPENDENT_AMBULATORY_CARE_PROVIDER_SITE_OTHER): Payer: Medicare Other | Admitting: Gastroenterology

## 2021-02-27 ENCOUNTER — Encounter: Payer: Self-pay | Admitting: Gastroenterology

## 2021-02-27 ENCOUNTER — Ambulatory Visit: Payer: Medicare Other | Admitting: Physical Therapy

## 2021-02-27 VITALS — BP 132/74 | HR 78 | Ht 61.0 in | Wt 251.0 lb

## 2021-02-27 DIAGNOSIS — R262 Difficulty in walking, not elsewhere classified: Secondary | ICD-10-CM | POA: Diagnosis not present

## 2021-02-27 DIAGNOSIS — K921 Melena: Secondary | ICD-10-CM | POA: Diagnosis not present

## 2021-02-27 DIAGNOSIS — M25611 Stiffness of right shoulder, not elsewhere classified: Secondary | ICD-10-CM | POA: Diagnosis not present

## 2021-02-27 DIAGNOSIS — M25511 Pain in right shoulder: Secondary | ICD-10-CM | POA: Diagnosis not present

## 2021-02-27 DIAGNOSIS — G8929 Other chronic pain: Secondary | ICD-10-CM | POA: Diagnosis not present

## 2021-02-27 DIAGNOSIS — M6281 Muscle weakness (generalized): Secondary | ICD-10-CM | POA: Diagnosis not present

## 2021-02-27 DIAGNOSIS — M25562 Pain in left knee: Secondary | ICD-10-CM | POA: Diagnosis not present

## 2021-02-27 DIAGNOSIS — M25561 Pain in right knee: Secondary | ICD-10-CM | POA: Diagnosis not present

## 2021-02-27 MED ORDER — SUPREP BOWEL PREP KIT 17.5-3.13-1.6 GM/177ML PO SOLN: 1.0000 | ORAL | 0 refills | Status: DC

## 2021-02-27 NOTE — Patient Instructions (Signed)
If you are age 78 or older, your body mass index should be between 23-30. Your Body mass index is 47.43 kg/m. If this is out of the aforementioned range listed, please consider follow up with your Primary Care Provider.  You have been scheduled for a colonoscopy. Please follow written instructions given to you at your visit today.  Please pick up your prep supplies at the pharmacy within the next 1-3 days. If you use inhalers (even only as needed), please bring them with you on the day of your procedure.  Due to recent changes in healthcare laws, you may see the results of your imaging and laboratory studies on MyChart before your provider has had a chance to review them.  We understand that in some cases there may be results that are confusing or concerning to you. Not all laboratory results come back in the same time frame and the provider may be waiting for multiple results in order to interpret others.  Please give [place] 48 hours in order for your provider to thoroughly review all the results before contacting the office for clarification of your results.   You will be contacted by our office prior to your procedure for directions on holding your Eliquis.  If you do not hear from our office 1 week prior to your scheduled procedure, please call [phone] to discuss.   We will attempt to obtain records from previous colonoscopy at [facility].  Thank you for entrusting me with your care and choosing [facility].  Dr Ardis Hughs

## 2021-02-27 NOTE — Therapy (Signed)
[place] [place], [place], [address] Phone: [phone]   Fax:  [phone]  Physical Therapy Treatment  Patient Details  Name: [name] MRN: [id number] Date of Birth: Oct 07, 1941 Referring Provider (PT): Hedy Camara Date: 02/27/2021   PT End of Session - 02/27/21 1543    Visit Number 2    Date for PT Re-Evaluation 05/14/21    PT Start Time 1500    PT Stop Time 7035    PT Time Calculation (min) 44 min    Activity Tolerance Patient tolerated treatment well    Behavior During Therapy [facility] for tasks assessed/performed           Past Medical History:  Diagnosis Date  . Arthritis   . Back pain   . Chronic anticoagulation    due to aflutter  . Chronic kidney disease   . Diabetes mellitus   . Diastolic CHF, chronic ([place])    a.  echo 2006 - ef 55-65%; mild diast dysfxn;    b. Echo 08/2011: Mild LVH, EF 60%;  c. 04/2013 Echo: EF 65-69%, mild conc LVH;  08/2014 Echo: EF 60-65%, mild-mod MR.  . Gout   . Hyperlipidemia   . Hypertension    a.  Renal arterial Dopplers 12/2011: 1-59% right renal artery stenosis  . Morbid obesity ([place])   . Obstructive sleep apnea on CPAP   . Paroxysmal Afib/Flutter    a. 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 APPENDECTOMY    . ATRIAL FLUTTER ABLATION N/A 09/24/2011   Procedure: ATRIAL FLUTTER ABLATION;  Surgeon: [name], MD;  Location: [facility] CATH LAB;  Service: Cardiovascular;  Laterality: N/A;  . CARDIOVERSION  10/22/2011   Procedure: CARDIOVERSION;  Surgeon: [name], MD;  Location: [place];  Service: Cardiovascular;  Laterality: N/A;  . CARDIOVERSION N/A 09/10/2011   Procedure: CARDIOVERSION;  Surgeon: [name], MD;  Location: [facility] CATH LAB;  Service: Cardiovascular;  Laterality: N/A;  . CHOLECYSTECTOMY    . TONSILLECTOMY  1982  . TOTAL ABDOMINAL HYSTERECTOMY      There were no vitals filed  for this visit.   Subjective Assessment - 02/27/21 1502    Subjective The same, been doing the exercises.    Pertinent History CKD, DM, CHF, HTN, afib    Limitations Lifting;House hold activities    Currently in Pain? No/denies                             [facility] Adult PT Treatment/Exercise - 02/27/21 0001      Shoulder Exercises: Seated   Extension Both;Theraband;20 reps;Strengthening    Theraband Level (Shoulder Extension) Level 2 (Red)    Row Both;20 reps;Theraband;Strengthening    Theraband Level (Shoulder Row) Level 2 (Red)    External Rotation Strengthening;Right;20 reps;Theraband    Theraband Level (Shoulder External Rotation) Level 1 (Yellow)    Internal Rotation Strengthening;Right;20 reps;Theraband    Theraband Level (Shoulder Internal Rotation) Level 1 (Yellow)    Other Seated Exercises Shoulder AAROM Flex x 10      Shoulder Exercises: Standing   Other Standing Exercises AAROM 1lb WaTE bar flex,ext, IR x10      Shoulder Exercises: ROM/Strengthening   UBE (Upper Arm Bike) L1 x 3 min each  PT Short Term Goals - 02/19/21 0942      PT SHORT TERM GOAL #1   Title Pt will be I with initial HEP    Time 2    Period Weeks    Status New    Target Date 03/05/21             PT Long Term Goals - 02/19/21 0943      PT LONG TERM GOAL #1   Title Pt will be I with advanced HEP    Time 8    Period Weeks    Status New    Target Date 04/16/21      PT LONG TERM GOAL #2   Title Pt will demo understanding of posture/body mechanics    Time 8    Period Weeks    Status New    Target Date 04/16/21      PT LONG TERM GOAL #3   Title Pt will report 50% reduction in R shoulder pain    Time 8    Period Weeks    Status New    Target Date 04/16/21      PT LONG TERM GOAL #4   Title Pt will improve FOTO from 44% limited to 56% limited or better    Time 8    Period Weeks    Status New    Target Date 04/16/21                  Plan - 02/27/21 1545    Clinical Impression Statement PT tolerated an initial progression to TE well. She has limited standing tolerance with interventions. R shoulder tends to be depressed compared L. Tactile cues for arm positioning needed with seated ER and IR. Cues needed not to lean back with seated rows. Seated shoulder flex and abd was most difficult for PT due to decrease muscular endurance.    Personal Factors and Comorbidities Comorbidity 3+    Comorbidities HTN, CHF, DM, afib, CKD    Examination-Activity Limitations Locomotion Level;Carry;Lift;Reach Overhead    Examination-Participation Restrictions Meal Prep;Cleaning;Laundry    Stability/Clinical Decision Making Evolving/Moderate complexity    Rehab Potential Good    PT Frequency 2x / week    PT Duration 8 weeks    PT Treatment/Interventions ADLs/Self Care Home Management;Electrical Stimulation;Iontophoresis 4mg /ml Dexamethasone;Moist Heat;Neuromuscular re-education;Therapeutic exercise;Therapeutic activities;Functional mobility training;Patient/family education;Manual techniques;Passive range of motion    PT Next Visit Plan Monitor vitals prior to beginning tx d/t heart conditions, strengthening of scapular stabilizers, postural mm, modalities/manual for pain control, functional compensation/adaptation, and incorporation of general conditioning/strengthening ex's           Patient will benefit from skilled therapeutic intervention in order to improve the following deficits and impairments:  Abnormal gait,Decreased range of motion,Difficulty walking,Cardiopulmonary status limiting activity,Decreased endurance,Obesity,Decreased activity tolerance,Pain,Hypomobility,Impaired flexibility,Decreased mobility,Decreased strength,Postural dysfunction  Visit Diagnosis: Chronic right shoulder pain  Stiffness of right shoulder, not elsewhere classified  Muscle weakness (generalized)  Chronic pain of right  knee     Problem List Patient Active Problem List   Diagnosis Date Noted  . Recurrent falls 01/10/2021  . Sensorineural hearing loss (SNHL), 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  . Persistent atrial fibrillation ([place]) 08/12/2020  . Type 2 diabetes mellitus with hyperglycemia, with long-term current use of insulin ([place]) 06/21/2020  . Type 2 diabetes mellitus with diabetic polyneuropathy, with long-term current use of insulin ([place]) 06/21/2020  . Mixed hyperlipidemia 06/21/2020  . Uncontrolled hypertension 06/11/2020  . Aortic atherosclerosis ([place]) 03/28/2020  . Diabetic peripheral neuropathy (  [place]) 12/26/2019  . Intertrigo 10/23/2019  . Tinnitus of both ears 09/23/2019  . Right facial numbness 08/23/2019  . Right-sided headache 08/23/2019  . Tingling of face 06/21/2019  . Gastroesophageal reflux disease without esophagitis 03/17/2019  . Rash 03/17/2019  . Bilateral leg edema 02/16/2019  . LLQ pain 02/16/2019  . Pruritus 02/16/2019  . Recurrent cellulitis of lower extremity 02/16/2019  . Varicose veins of both legs with edema 02/16/2019  . Pain of right scapula 01/05/2019  . Heart murmur 09/05/2018  . Restrictive lung disease 02/22/2018  . Dysgeusia 06/11/2017  . Allergic dermatitis 06/03/2017  . Osteoarthritis of left knee 06/03/2017  . NAFLD (nonalcoholic fatty liver disease) 08/07/2016  . Osteoporosis 12/31/2015  . Obesity 12/19/2015  . Hypersomnia 12/19/2015  . Edema 10/18/2015  . Acquired hypothyroidism 10/18/2015  . Acute diastolic congestive heart failure, NYHA class 3 ([place]) 10/03/2013  . Prolonged QT interval 10/03/2013  . Acute diastolic heart failure ([place]) 10/03/2013  . Long term (current) use of anticoagulants 06/09/2012  . BENIGN NEOPLASM OF ADRENAL GLAND 11/25/2010  . Chronic diastolic heart failure ([place]) 02/20/2010  . DM 05/16/2009  . GOUT 05/16/2009  . OBESITY, MORBID 05/16/2009  . Hypertension associated with diabetes ([place]) 05/16/2009  . Paroxysmal atrial fibrillation  ([place]) 05/16/2009  . HYPERLIPIDEMIA 11/30/2008  . Obstructive sleep apnea on CPAP 11/30/2008    Scot Jun 02/27/2021, 3:49 PM  [place]. [place], [place], [address] Phone: [phone]   Fax:  [phone]  Name: [name] MRN: [id number] Date of Birth: 24-Aug-1941

## 2021-02-27 NOTE — Telephone Encounter (Signed)
    Seeking advice   Patient calling to report bladder pressure x 3 days She thinks she may have a UTI Can urine test be ordered?  Please advise

## 2021-02-27 NOTE — Telephone Encounter (Signed)
[facility] Pre-operative Risk Assessment     Request for surgical clearance:     Endoscopy Procedure  What type of surgery is being performed?     Colonoscopy   When is this surgery scheduled?     04-11-2021  What type of clearance is required ?   Pharmacy  Are there any medications that need to be held prior to surgery and how long? Eliquis x 2 days  Practice name and name of physician performing surgery?      [facility]  What is your office phone and fax number?      Phone- [phone]  [phone]  Anesthesia type (None, local, MAC, general) ?       MAC

## 2021-02-27 NOTE — Telephone Encounter (Signed)
Pt states her left leg/foot is still swollen. She has been doing what she was advised to do.

## 2021-02-27 NOTE — Telephone Encounter (Addendum)
Diabetic shoes/inserts...lvm for pt to call to schedule an appt to pick them up. 

## 2021-02-27 NOTE — Progress Notes (Signed)
Review of pertinent gastrointestinal problems: 1.  Chronic alternating constipation diarrhea.  Establish care with Dr. Ardis Hughs March 2022.  Recommended daily fiber supplement as a trial to help her alternating bowel habits and lower abdominal discomforts. 2.  Incomplete medical records.  Patient is certain that she had colonoscopy sometime in the past several years however we reached out to [facility] gastroenterology where she was a patient previously and could tell that in September 2018 to discuss colon cancer screening, instead she opted for Cologuard and it was negative.   HPI: This is a pleasant 80 year old woman whom I last saw about 2 months ago  At the time of that visit I recommended trial of fiber supplement to address her lower abdominal cramping, alternating bowel habits.  She never started that fiber supplement.  We reached out to [facility] gastroenterology and was told that she never had colonoscopy or EGD.  Her weight is down 2 pounds since her last visit here about 2 months ago, same scale here in our office.  Today she tells me that despite never starting fiber supplement as I recommended her bowels have improved and her lower abdominal cramping has completely resolved.  She does however see some blood in the toilet after she flushes.  This is bright red.  She is on Eliquis for atrial fibrillation and has been for quite a long time.  She also tells me that she is absolutely positive that she underwent a colonoscopy and upper endoscopy at [facility] sometime around 2018.  She was going to pick up records yesterday but said the parking there is terrible and so she never did.  ROS: complete GI ROS as described in HPI, all other review negative.  Constitutional:  No unintentional weight loss   Past Medical History:  Diagnosis Date  . Arthritis   . Back pain   . Chronic anticoagulation    due to aflutter  . Chronic kidney disease   . Diabetes mellitus   .  Diastolic CHF, chronic ([place])    a.  echo 2006 - ef 55-65%; mild diast dysfxn;    b. Echo 08/2011: Mild LVH, EF 60%;  c. 04/2013 Echo: EF 65-69%, mild conc LVH;  08/2014 Echo: EF 60-65%, mild-mod MR.  . Gout   . Hyperlipidemia   . Hypertension    a.  Renal arterial Dopplers 12/2011: 1-59% right renal artery stenosis  . Morbid obesity ([place])   . Obstructive sleep apnea on CPAP   . Paroxysmal Afib/Flutter    a. 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 APPENDECTOMY    . ATRIAL FLUTTER ABLATION N/A 09/24/2011   Procedure: ATRIAL FLUTTER ABLATION;  Surgeon: [name], MD;  Location: [facility] CATH LAB;  Service: Cardiovascular;  Laterality: N/A;  . CARDIOVERSION  10/22/2011   Procedure: CARDIOVERSION;  Surgeon: [name], MD;  Location: [place];  Service: Cardiovascular;  Laterality: N/A;  . CARDIOVERSION N/A 09/10/2011   Procedure: CARDIOVERSION;  Surgeon: [name], MD;  Location: [facility] CATH LAB;  Service: Cardiovascular;  Laterality: N/A;  . CHOLECYSTECTOMY    . TONSILLECTOMY  1982  . TOTAL ABDOMINAL HYSTERECTOMY      Current Outpatient Medications  Medication Sig Dispense Refill  . Accu-Chek Softclix Lancets lancets 1 each by Other route 3 (three) times daily. as directed 100 each 3  . allopurinol (ZYLOPRIM) 300 MG tablet TAKE 1 TABLET(300 MG) BY MOUTH DAILY 90 tablet  3  . amiodarone (PACERONE) 200 MG tablet Take 1 tablet (200 mg total) by mouth daily. 90 tablet 3  . blood glucose meter kit and supplies Dispense based on patient and insurance preference. Use up to four times daily as directed. (FOR ICD-10 E10.9, E11.9). 1 each 0  . cloNIDine (CATAPRES) 0.3 MG tablet TAKE 1 TABLET(0.3 MG) BY MOUTH TWICE DAILY 180 tablet 3  . Continuous Blood Gluc Receiver (FREESTYLE LIBRE 14 DAY READER) DEVI Use as directed. 1 each 0  . Continuous Blood Gluc Sensor (FREESTYLE LIBRE 14 DAY SENSOR) MISC Use as directed to check blood sugar daily 2 each 3   . diltiazem (CARDIZEM) 30 MG tablet Take 30 mg by mouth. Take one tablet every 4 hours as needed for heart rate greater than 100 and blood pressure needs to be above 100    . ELIQUIS 5 MG TABS tablet TAKE 1 TABLET(5 MG) BY MOUTH TWICE DAILY 180 tablet 1  . Insulin Lispro Prot & Lispro (HUMALOG MIX 75/25 KWIKPEN) (75-25) 100 UNIT/ML Kwikpen Inject 15 Units into the skin 2 (two) times daily. 30 mL 3  . levothyroxine (SYNTHROID) 75 MCG tablet Take 1 tablet (75 mcg total) by mouth daily before breakfast. 90 tablet 1  . lidocaine (LIDODERM) 5 % Place 1 patch onto the skin daily. Remove & Discard patch within 12 hours or as directed by MD 30 patch 0  . loperamide (IMODIUM) 2 MG capsule Take 1 capsule (2 mg total) by mouth 4 (four) times daily as needed for diarrhea or loose stools. 12 capsule 0  . losartan (COZAAR) 100 MG tablet Take 1 tablet (100 mg total) by mouth daily. 90 tablet 3  . NOVOFINE PLUS 32G X 4 MM MISC 1 each by Subdermal route 2 (two) times daily. as directed 60 each 3  . ONETOUCH VERIO test strip 1 each 3 (three) times daily.    . potassium chloride (KLOR-CON) 10 MEQ tablet Take 2 tablets (20 mEq total) by mouth daily. 180 tablet 3  . RESTASIS MULTIDOSE 0.05 % ophthalmic emulsion Place 1 drop into both eyes 2 (two) times daily.     . rosuvastatin (CRESTOR) 10 MG tablet Take 10 mg by mouth every evening.    . Semaglutide,0.25 or 0.5MG/DOS, (OZEMPIC, 0.25 OR 0.5 MG/DOSE,) 2 MG/1.5ML SOPN Inject 0.75 mLs (1 mg total) into the skin once a week. 12 pen 3  . tiZANidine (ZANAFLEX) 4 MG tablet Take 1 tablet (4 mg total) by mouth every 8 (eight) hours as needed for muscle spasms. 60 tablet 1  . torsemide (DEMADEX) 20 MG tablet TAKE 2 TABLETS(40 MG) BY MOUTH DAILY 180 tablet 3  . triamcinolone cream (KENALOG) 0.1 % Apply 1 application topically 2 (two) times daily. 30 g 0  . verapamil (CALAN) 120 MG tablet Take 1 tablet (120 mg total) by mouth 2 (two) times daily. 180 tablet 3   No current  facility-administered medications for this visit.    Allergies as of 02/27/2021 - Review Complete 02/27/2021  Allergen Reaction Noted  . Metformin Diarrhea 12/10/2015    Family History  Problem Relation Age of Onset  . Heart disease Father   . Hypertension Father   . Breast cancer Sister   . Cancer Sister        breast  . Colon cancer Neg Hx   . Esophageal cancer Neg Hx   . Pancreatic cancer Neg Hx   . Liver disease Neg Hx     Social History  Socioeconomic History  . Marital status: Married    Spouse name: Not on file  . Number of children: 3  . Years of education: Not on file  . Highest education level: Not on file  Occupational History  . Occupation: DISABILITY/housewife    Employer: RETIRED  Tobacco Use  . Smoking status: Never Smoker  . Smokeless tobacco: Never Used  Vaping Use  . Vaping Use: Never used  Substance and Sexual Activity  . Alcohol use: No  . Drug use: No  . Sexual activity: Yes  Other Topics Concern  . Not on file  Social History Narrative  . Not on file   Social Determinants of Health   Financial Resource Strain: Not on file  Food Insecurity: Not on file  Transportation Needs: Not on file  Physical Activity: Not on file  Stress: Not on file  Social Connections: Not on file  Intimate Partner Violence: Not on file     Physical Exam: BP 132/74   Pulse 78   Ht '5\' 1"'  (1.549 m)   Wt 251 lb (113.9 kg)   BMI 47.43 kg/m  Constitutional:  she is morbidly obese, she walks with a walker Psychiatric: alert and oriented x3 Abdomen: soft, nontender, nondistended, no obvious ascites, no peritoneal signs, normal bowel sounds No peripheral edema noted in lower extremities  Assessment and plan: 80 y.o. female with resolved lower abdominal cramping, alternating bowel habits, new rectal bleeding in the setting of chronic blood thinner  First I am happy that her abdominal cramping and alternating bowel habits have resolved despite the fact that  she never tried fiber supplements.  Second she is absolutely 100% positive that she underwent a colonoscopy and upper endoscopy at [facility] around 2018.  We will reach out again to [facility] to see if we can gather those records.  Third she has new rectal bleeding in the setting of chronic blood thinner.  I recommended colonoscopy to exclude significant causes.  Given her morbid obesity with a BMI of 47 and her marginal ambulation status, walks with a walker, I think it would be safest that we do this at [facility].  Rather than our ambulatory surgical center.  Please see the "Patient Instructions" section for addition details about the plan.  Owens Loffler, MD [facility] 02/27/2021, 9:35 AM   Total time on date of encounter was 35 minutes (this included time spent preparing to see the patient reviewing records; obtaining and/or reviewing separately obtained history; performing a medically appropriate exam and/or evaluation; counseling and educating the patient and family if present; ordering medications, tests or procedures if applicable; and documenting clinical information in the health record).

## 2021-02-27 NOTE — Telephone Encounter (Signed)
Appt made, pt denies COVID symptoms.

## 2021-02-28 ENCOUNTER — Ambulatory Visit (INDEPENDENT_AMBULATORY_CARE_PROVIDER_SITE_OTHER): Payer: Medicare Other | Admitting: Emergency Medicine

## 2021-02-28 ENCOUNTER — Encounter: Payer: Self-pay | Admitting: Emergency Medicine

## 2021-02-28 VITALS — BP 136/70 | HR 67 | Temp 98.1°F | Ht 61.0 in | Wt 255.0 lb

## 2021-02-28 DIAGNOSIS — I700 Atherosclerosis of aorta: Secondary | ICD-10-CM

## 2021-02-28 DIAGNOSIS — I152 Hypertension secondary to endocrine disorders: Secondary | ICD-10-CM | POA: Diagnosis not present

## 2021-02-28 DIAGNOSIS — I480 Paroxysmal atrial fibrillation: Secondary | ICD-10-CM | POA: Diagnosis not present

## 2021-02-28 DIAGNOSIS — I5032 Chronic diastolic (congestive) heart failure: Secondary | ICD-10-CM | POA: Diagnosis not present

## 2021-02-28 DIAGNOSIS — Z7901 Long term (current) use of anticoagulants: Secondary | ICD-10-CM | POA: Diagnosis not present

## 2021-02-28 DIAGNOSIS — E1159 Type 2 diabetes mellitus with other circulatory complications: Secondary | ICD-10-CM

## 2021-02-28 DIAGNOSIS — I4819 Other persistent atrial fibrillation: Secondary | ICD-10-CM

## 2021-02-28 DIAGNOSIS — M8949 Other hypertrophic osteoarthropathy, multiple sites: Secondary | ICD-10-CM

## 2021-02-28 DIAGNOSIS — R300 Dysuria: Secondary | ICD-10-CM | POA: Diagnosis not present

## 2021-02-28 DIAGNOSIS — M159 Polyosteoarthritis, unspecified: Secondary | ICD-10-CM

## 2021-02-28 LAB — POCT URINALYSIS DIP (MANUAL ENTRY)
Bilirubin, UA: NEGATIVE
Glucose, UA: NEGATIVE mg/dL
Ketones, POC UA: NEGATIVE mg/dL
Leukocytes, UA: NEGATIVE
Nitrite, UA: NEGATIVE
Spec Grav, UA: 1.015 (ref 1.010–1.025)
Urobilinogen, UA: 0.2 E.U./dL
pH, UA: 6 (ref 5.0–8.0)

## 2021-02-28 MED ORDER — CEFUROXIME AXETIL 500 MG PO TABS: 500.0000 mg | ORAL_TABLET | Freq: Two times a day (BID) | ORAL | 0 refills | Status: AC

## 2021-02-28 NOTE — Telephone Encounter (Signed)
Patient advised that she has been given clearance to hold Eliquis 2 days prior to colonoscopy scheduled for 04-11-2021.  Patient advised to take last dose of Eliquis on 04-08-2021, and she will be advised when to restart Eliquis by Dr Ardis Hughs after the procedure.  Patient agreed to plan and verbalized understanding.  No further questions.

## 2021-02-28 NOTE — Patient Instructions (Signed)

## 2021-02-28 NOTE — Telephone Encounter (Signed)
Patient with diagnosis of atrial fibrillation on Eliquis for anticoagulation.    Procedure: colonoscopy Date of procedure: 04/11/21   CHA2DS2-VASc Score = 6  This indicates a 9.7% annual risk of stroke. The patient's score is based upon: CHF History: Yes HTN History: Yes Diabetes History: Yes Stroke History: No Vascular Disease History: No Age Score: 2 Gender Score: 1   CrCl 105.9 (44.4 with IBW) Platelet count 351  Per office protocol, patient can hold Eliquis for 2 days prior to procedure.   Patient will not need bridging with Lovenox (enoxaparin) around procedure.

## 2021-02-28 NOTE — Telephone Encounter (Signed)
    [name] DOB:  05-08-1941  MRN:  [id number]   Primary Cardiologist: None  Chart reviewed as part of pre-operative protocol coverage. Pharmacy clearance only requested. Per pharmacy review and office protocols,  [name] may hold Eliquis 2 days prior to the planned procedure. She will not require bridging with Lovenox (enoxaparin) around the procedure.   I will route this recommendation to the requesting party via Epic fax function and remove from pre-op pool.  Please call with questions.  Loel Dubonnet, NP 02/28/2021, 9:16 AM

## 2021-03-01 ENCOUNTER — Telehealth: Payer: Self-pay | Admitting: Gastroenterology

## 2021-03-01 ENCOUNTER — Ambulatory Visit: Payer: Medicare Other | Admitting: Physical Therapy

## 2021-03-01 ENCOUNTER — Other Ambulatory Visit: Payer: Self-pay

## 2021-03-01 DIAGNOSIS — M25511 Pain in right shoulder: Secondary | ICD-10-CM | POA: Diagnosis not present

## 2021-03-01 DIAGNOSIS — M6281 Muscle weakness (generalized): Secondary | ICD-10-CM

## 2021-03-01 DIAGNOSIS — M25611 Stiffness of right shoulder, not elsewhere classified: Secondary | ICD-10-CM | POA: Diagnosis not present

## 2021-03-01 DIAGNOSIS — G8929 Other chronic pain: Secondary | ICD-10-CM | POA: Diagnosis not present

## 2021-03-01 DIAGNOSIS — R300 Dysuria: Secondary | ICD-10-CM | POA: Insufficient documentation

## 2021-03-01 DIAGNOSIS — M25562 Pain in left knee: Secondary | ICD-10-CM | POA: Diagnosis not present

## 2021-03-01 DIAGNOSIS — R262 Difficulty in walking, not elsewhere classified: Secondary | ICD-10-CM | POA: Diagnosis not present

## 2021-03-01 DIAGNOSIS — M8949 Other hypertrophic osteoarthropathy, multiple sites: Secondary | ICD-10-CM | POA: Insufficient documentation

## 2021-03-01 DIAGNOSIS — M25561 Pain in right knee: Secondary | ICD-10-CM | POA: Diagnosis not present

## 2021-03-01 DIAGNOSIS — M159 Polyosteoarthritis, unspecified: Secondary | ICD-10-CM | POA: Insufficient documentation

## 2021-03-01 LAB — URINE CULTURE

## 2021-03-01 NOTE — Assessment & Plan Note (Signed)
Early UTI.  We will start Ceftin 500 mg twice a day for 7 days. Pending urine culture.

## 2021-03-01 NOTE — Progress Notes (Signed)
[name] 80 y.o.   Chief Complaint  Patient presents with  . Dysuria    Pt feels pressure when she urinate, x 2days    HISTORY OF PRESENT ILLNESS: This is a 80 y.o. female complaining of lower urinary tract symptoms that started 2 days ago.  States this is how the UTIs starts with her mostly with bladder pressure and urgency.  Denies fever or chills.  Denies nausea or vomiting.  Denies flank pain.  Otherwise doing well. Also complaining of some intermittent edema of her ankles and feet. Also inquiring about status of bariatric walker requested weeks ago. No other complaints or medical concerns today.  HPI   Prior to Admission medications   Medication Sig Start Date End Date Taking? Authorizing Provider  Accu-Chek Softclix Lancets lancets 1 each by Other route 3 (three) times daily. as directed 03/30/20  Yes Maximiano Coss, NP  allopurinol (ZYLOPRIM) 300 MG tablet TAKE 1 TABLET(300 MG) BY MOUTH DAILY 01/21/21  Yes Cruzita Lipa, Ines Bloomer, MD  amiodarone (PACERONE) 200 MG tablet Take 1 tablet (200 mg total) by mouth daily. 07/18/20  Yes Sherran Needs, NP  blood glucose meter kit and supplies Dispense based on patient and insurance preference. Use up to four times daily as directed. (FOR ICD-10 E10.9, E11.9). 05/03/20  Yes Linell Shawn, Ines Bloomer, MD  cefUROXime (CEFTIN) 500 MG tablet Take 1 tablet (500 mg total) by mouth 2 (two) times daily with a meal for 7 days. 02/28/21 03/07/21 Yes Nycole Kawahara, Ines Bloomer, MD  cloNIDine (CATAPRES) 0.3 MG tablet TAKE 1 TABLET(0.3 MG) BY MOUTH TWICE DAILY 01/21/21  Yes Devan Babino, Ines Bloomer, MD  Continuous Blood Gluc Receiver (FREESTYLE LIBRE 14 DAY READER) DEVI Use as directed. 04/18/20  Yes Cindy Fullman, Ines Bloomer, MD  Continuous Blood Gluc Sensor (FREESTYLE LIBRE 14 DAY SENSOR) MISC Use as directed to check blood sugar daily 09/24/20  Yes [name], Melanie Crazier, MD  diltiazem (CARDIZEM) 30 MG tablet Take 30 mg by mouth. Take one tablet every 4 hours as  needed for heart rate greater than 100 and blood pressure needs to be above 100   Yes Sherran Needs, NP  ELIQUIS 5 MG TABS tablet TAKE 1 TABLET(5 MG) BY MOUTH TWICE DAILY 12/14/20  Yes [name], MD  Insulin Lispro Prot & Lispro (HUMALOG MIX 75/25 KWIKPEN) (75-25) 100 UNIT/ML Kwikpen Inject 15 Units into the skin 2 (two) times daily. 01/10/21  Yes [name], Melanie Crazier, MD  levothyroxine (SYNTHROID) 75 MCG tablet Take 1 tablet (75 mcg total) by mouth daily before breakfast. 01/23/21 04/23/21 Yes Jeriann Sayres, Ines Bloomer, MD  lidocaine (LIDODERM) 5 % Place 1 patch onto the skin daily. Remove & Discard patch within 12 hours or as directed by MD 08/29/20  Yes Mcarthur Rossetti, MD  loperamide (IMODIUM) 2 MG capsule Take 1 capsule (2 mg total) by mouth 4 (four) times daily as needed for diarrhea or loose stools. 03/20/20  Yes Garald Balding, PA-C  losartan (COZAAR) 100 MG tablet Take 1 tablet (100 mg total) by mouth daily. 05/31/20  Yes Elver Stadler, Ines Bloomer, MD  Na Sulfate-K Sulfate-Mg Sulf (SUPREP BOWEL PREP KIT) 17.5-3.13-1.6 GM/177ML SOLN Take 1 kit by mouth as directed. 02/27/21  Yes Milus Banister, MD  NOVOFINE PLUS 32G X 4 MM MISC 1 each by Subdermal route 2 (two) times daily. as directed 03/30/20  Yes Maximiano Coss, NP  [facility] VERIO test strip 1 each 3 (three) times daily. 06/08/20  Yes [provider]  potassium chloride (  KLOR-CON) 10 MEQ tablet Take 2 tablets (20 mEq total) by mouth daily. 10/18/19  Yes [name], MD  RESTASIS MULTIDOSE 0.05 % ophthalmic emulsion Place 1 drop into both eyes 2 (two) times daily.  03/04/20  Yes [provider]  rosuvastatin (CRESTOR) 10 MG tablet Take 10 mg by mouth every evening.   Yes [provider]  Semaglutide,0.25 or 0.5MG/DOS, (OZEMPIC, 0.25 OR 0.5 MG/DOSE,) 2 MG/1.5ML SOPN Inject 0.75 mLs (1 mg total) into the skin once a week. 04/18/20  Yes Peter Daquila, Ines Bloomer, MD  tiZANidine (ZANAFLEX) 4 MG tablet Take 1  tablet (4 mg total) by mouth every 8 (eight) hours as needed for muscle spasms. 08/29/20  Yes Mcarthur Rossetti, MD  torsemide (DEMADEX) 20 MG tablet TAKE 2 TABLETS(40 MG) BY MOUTH DAILY 11/07/20  Yes [name], MD  triamcinolone cream (KENALOG) 0.1 % Apply 1 application topically 2 (two) times daily. 07/26/20  Yes Kairyn Olmeda, Ines Bloomer, MD  verapamil (CALAN) 120 MG tablet Take 1 tablet (120 mg total) by mouth 2 (two) times daily. 08/15/20  Yes [name], MD    Allergies  Allergen Reactions  . Metformin Diarrhea    Patient Active Problem List   Diagnosis Date Noted  . Recurrent falls 01/10/2021  . Sensorineural hearing loss (SNHL), 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  . Persistent atrial fibrillation ([place]) 08/12/2020  . Type 2 diabetes mellitus with hyperglycemia, with long-term current use of insulin ([place]) 06/21/2020  . Type 2 diabetes mellitus with diabetic polyneuropathy, with long-term current use of insulin ([place]) 06/21/2020  . Mixed hyperlipidemia 06/21/2020  . Uncontrolled hypertension 06/11/2020  . Aortic atherosclerosis ([place]) 03/28/2020  . Diabetic peripheral neuropathy ([place]) 12/26/2019  . Intertrigo 10/23/2019  . Tinnitus of both ears 09/23/2019  . Right facial numbness 08/23/2019  . Right-sided headache 08/23/2019  . Tingling of face 06/21/2019  . Gastroesophageal reflux disease without esophagitis 03/17/2019  . Rash 03/17/2019  . Bilateral leg edema 02/16/2019  . LLQ pain 02/16/2019  . Pruritus 02/16/2019  . Recurrent cellulitis of lower extremity 02/16/2019  . Varicose veins of both legs with edema 02/16/2019  . Pain of right scapula 01/05/2019  . Heart murmur 09/05/2018  . Restrictive lung disease 02/22/2018  . Dysgeusia 06/11/2017  . Allergic dermatitis 06/03/2017  . Osteoarthritis of left knee 06/03/2017  . NAFLD (nonalcoholic fatty liver disease) 08/07/2016  . Osteoporosis 12/31/2015  . Obesity 12/19/2015  . Hypersomnia 12/19/2015  . Edema 10/18/2015  .  Acquired hypothyroidism 10/18/2015  . Acute diastolic congestive heart failure, NYHA class 3 ([place]) 10/03/2013  . Prolonged QT interval 10/03/2013  . Acute diastolic heart failure ([place]) 10/03/2013  . Long term (current) use of anticoagulants 06/09/2012  . BENIGN NEOPLASM OF ADRENAL GLAND 11/25/2010  . Chronic diastolic heart failure ([place]) 02/20/2010  . DM 05/16/2009  . GOUT 05/16/2009  . OBESITY, MORBID 05/16/2009  . Hypertension associated with diabetes ([place]) 05/16/2009  . Paroxysmal atrial fibrillation ([place]) 05/16/2009  . HYPERLIPIDEMIA 11/30/2008  . Obstructive sleep apnea on CPAP 11/30/2008    Past Medical History:  Diagnosis Date  . Arthritis   . Back pain   . Chronic anticoagulation    due to aflutter  . Chronic kidney disease   . Diabetes mellitus   . Diastolic CHF, chronic ([place])    a.  echo 2006 - ef 55-65%; mild diast dysfxn;    b. Echo 08/2011: Mild LVH, EF 60%;  c. 04/2013 Echo: EF 65-69%, mild conc LVH;  08/2014 Echo: EF 60-65%,  mild-mod MR.  . Gout   . Hyperlipidemia   . Hypertension    a.  Renal arterial Dopplers 12/2011: 1-59% right renal artery stenosis  . Morbid obesity ([place])   . Obstructive sleep apnea on CPAP   . Paroxysmal Afib/Flutter    a. 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 APPENDECTOMY    . ATRIAL FLUTTER ABLATION N/A 09/24/2011   Procedure: ATRIAL FLUTTER ABLATION;  Surgeon: [name], MD;  Location: [facility] CATH LAB;  Service: Cardiovascular;  Laterality: N/A;  . CARDIOVERSION  10/22/2011   Procedure: CARDIOVERSION;  Surgeon: [name], MD;  Location: [place];  Service: Cardiovascular;  Laterality: N/A;  . CARDIOVERSION N/A 09/10/2011   Procedure: CARDIOVERSION;  Surgeon: [name], MD;  Location: [facility] CATH LAB;  Service: Cardiovascular;  Laterality: N/A;  . CHOLECYSTECTOMY    . TONSILLECTOMY  1982  . TOTAL ABDOMINAL HYSTERECTOMY      Social History   Socioeconomic History  .  Marital status: Married    Spouse name: Not on file  . Number of children: 3  . Years of education: Not on file  . Highest education level: Not on file  Occupational History  . Occupation: DISABILITY/housewife    Employer: RETIRED  Tobacco Use  . Smoking status: Never Smoker  . Smokeless tobacco: Never Used  Vaping Use  . Vaping Use: Never used  Substance and Sexual Activity  . Alcohol use: No  . Drug use: No  . Sexual activity: Yes  Other Topics Concern  . Not on file  Social History Narrative  . Not on file   Social Determinants of Health   Financial Resource Strain: Not on file  Food Insecurity: Not on file  Transportation Needs: Not on file  Physical Activity: Not on file  Stress: Not on file  Social Connections: Not on file  Intimate Partner Violence: Not on file    Family History  Problem Relation Age of Onset  . Heart disease Father   . Hypertension Father   . Breast cancer Sister   . Cancer Sister        breast  . Colon cancer Neg Hx   . Esophageal cancer Neg Hx   . Pancreatic cancer Neg Hx   . Liver disease Neg Hx      Review of Systems  Constitutional: Negative.  Negative for chills and fever.  HENT: Negative.  Negative for congestion and sore throat.   Respiratory: Negative.  Negative for cough and shortness of breath.   Cardiovascular: Negative.  Negative for chest pain and palpitations.  Gastrointestinal: Negative.  Negative for abdominal pain, diarrhea, nausea and vomiting.  Genitourinary: Positive for dysuria, frequency and urgency. Negative for flank pain and hematuria.  Skin: Negative.   Neurological: Negative.  Negative for dizziness and headaches.  All other systems reviewed and are negative.    Today's Vitals   02/28/21 1526  BP: 136/70  Pulse: 67  Temp: 98.1 F (36.7 C)  TempSrc: Oral  SpO2: 98%  Weight: 255 lb (115.7 kg)  Height: '5\' 1"'  (1.549 m)   Body mass index is 48.18 kg/m.   Physical Exam Vitals reviewed.   Constitutional:      Appearance: She is obese.  HENT:     Head: Normocephalic.  Eyes:     Extraocular Movements: Extraocular movements intact.     Pupils: Pupils are equal, round, and reactive to light.  Cardiovascular:  Rate and Rhythm: Normal rate. Rhythm irregular.     Pulses: Normal pulses.     Heart sounds: Normal heart sounds.  Pulmonary:     Effort: Pulmonary effort is normal.     Breath sounds: Normal breath sounds.  Musculoskeletal:        General: Normal range of motion.     Cervical back: Normal range of motion and neck supple.  Skin:    General: Skin is warm and dry.     Capillary Refill: Capillary refill takes less than 2 seconds.  Neurological:     General: No focal deficit present.     Mental Status: She is alert and oriented to person, place, and time.  Psychiatric:        Mood and Affect: Mood normal.        Behavior: Behavior normal.    Results for orders placed or performed in visit on 02/28/21 (from the past 24 hour(s))  POCT urinalysis dipstick     Status: Abnormal   Collection Time: 02/28/21  3:28 PM  Result Value Ref Range   Color, UA yellow yellow   Clarity, UA clear clear   Glucose, UA negative negative mg/dL   Bilirubin, UA negative negative   Ketones, POC UA negative negative mg/dL   Spec Grav, UA 1.015 1.010 - 1.025   Blood, UA small (A) negative   pH, UA 6.0 5.0 - 8.0   Protein Ur, POC trace (A) negative mg/dL   Urobilinogen, UA 0.2 0.2 or 1.0 E.U./dL   Nitrite, UA Negative Negative   Leukocytes, UA Negative Negative     ASSESSMENT & PLAN: A total of 30 minutes was spent with the patient and counseling/coordination of care regarding review of multiple chronic medical problems, diagnosis and treatment of UTI, need for antibiotic, education on nutrition, bariatric walker request including phone call to [facility] and faxing request, license plate disability request, review of all medications, review of most recent office visit note, fall  precaution advice, health maintenance items (patient scheduled for colonoscopy this year), prognosis, documentation and need for follow-up  Persistent atrial fibrillation ([place]) Well-controlled ventricular rate.  Continue amiodarone 200 mg daily and verapamil.  Last cardiology visit in January reviewed. On long-term anticoagulation with Eliquis.  Hypertension associated with diabetes ([place]) Well-controlled hypertension.  Continue losartan 100 mg daily and diuretics. Continue present diabetic medications.  No changes. Lab Results  Component Value Date   HGBA1C 7.4 (A) 11/29/2020     Chronic diastolic heart failure Currently euvolemic.  Continue furosemide.  Aortic atherosclerosis (HCC) Continue rosuvastatin 10 mg daily.  Diet and nutrition discussed.  Dysuria Early UTI.  We will start Ceftin 500 mg twice a day for 7 days. Pending urine culture.  Randee was seen today for dysuria.  Diagnoses and all orders for this visit:  Dysuria -     POCT urinalysis dipstick -     Urine Culture -     cefUROXime (CEFTIN) 500 MG tablet; Take 1 tablet (500 mg total) by mouth 2 (two) times daily with a meal for 7 days.  Hypertension associated with diabetes ([place]) -     AMB Referral to [facility]  Paroxysmal atrial fibrillation ([place]) -     AMB Referral to [place]  Primary osteoarthritis involving multiple joints -     For home use only DME 4 wheeled rolling walker with seat ([id number]) -     AMB Referral to [place]  Persistent atrial fibrillation ([place])  Chronic diastolic heart failure (HCC)  Aortic atherosclerosis ([place])  Long term (current) use of anticoagulants    Patient Instructions   Dysuria Dysuria is pain or discomfort during urination. The pain or discomfort may be felt in the part of the body that drains urine from the bladder (urethra) or in the surrounding tissue of the genitals. The pain may also be felt in the groin  area, lower abdomen, or lower back. You may have to urinate frequently or have the sudden feeling that you have to urinate (urgency). Dysuria can affect anyone, but it is more common in females. Dysuria can be caused by many different things, including:  Urinary tract infection.  Kidney stones or bladder stones.  Certain STIs (sexually transmitted infections), such as chlamydia.  Dehydration.  Inflammation of the tissues of the vagina.  Use of certain medicines.  Use of certain soaps or scented products that cause irritation. Follow these instructions at home: Medicines  Take over-the-counter and prescription medicines only as told by your health care provider.  If you were prescribed an antibiotic medicine, take it as told by your health care provider. Do not stop taking the antibiotic even if you start to feel better. Eating and drinking  Drink enough fluid to keep your urine pale yellow.  Avoid caffeinated beverages, tea, and alcohol. These beverages can irritate the bladder and make dysuria worse. In males, alcohol may irritate the prostate.   General instructions  Watch your condition for any changes.  Urinate often. Avoid holding urine for long periods of time.  If you are female, you should wipe from front to back after urinating or having a bowel movement. Use each piece of toilet paper only once.  Empty your bladder after sex.  Keep all follow-up visits. This is important.  If you had any tests done to find the cause of dysuria, it is up to you to get your test results. Ask your health care provider, or the department that is doing the test, when your results will be ready. Contact a health care provider if:  You have a fever.  You develop pain in your back or sides.  You have nausea or vomiting.  You have blood in your urine.  You are not urinating as often as you usually do. Get help right away if:  Your pain is severe and not relieved with  medicines.  You cannot eat or drink without vomiting.  You are confused.  You have a rapid heartbeat while resting.  You have shaking or chills.  You feel extremely weak. Summary  Dysuria is pain or discomfort while urinating. Many different conditions can lead to dysuria.  If you have dysuria, you may have to urinate frequently or have the sudden feeling that you have to urinate (urgency).  Watch your condition for any changes. Keep all follow-up visits.  Make sure that you urinate often and drink enough fluid to keep your urine pale yellow. This information is not intended to replace advice given to you by your health care provider. Make sure you discuss any questions you have with your health care provider. Document Revised: 05/18/2020 Document Reviewed: 05/18/2020 Elsevier Patient Education  2021 [place], MD [facility]

## 2021-03-01 NOTE — Assessment & Plan Note (Addendum)
Well-controlled ventricular rate.  Continue amiodarone 200 mg daily and verapamil.  Last cardiology visit in January reviewed. On long-term anticoagulation with Eliquis.

## 2021-03-01 NOTE — Assessment & Plan Note (Signed)
Continue rosuvastatin 10 mg daily. Diet and nutrition discussed. 

## 2021-03-01 NOTE — Therapy (Signed)
[place]. [place], [place], [address] Phone: [phone]   Fax:  [phone]  Physical Therapy Treatment  Patient Details  Name: [name] MRN: [id number] Date of Birth: 11-20-1940 Referring Provider (PT): Ninfa Linden   Encounter Date: 03/01/2021   PT End of Session - 03/01/21 1059    Visit Number 3    Date for PT Re-Evaluation 05/14/21    PT Start Time 1937    PT Stop Time 1055    PT Time Calculation (min) 40 min    Activity Tolerance Patient limited by pain    Behavior During Therapy [facility] for tasks assessed/performed           Past Medical History:  Diagnosis Date  . Arthritis   . Back pain   . Chronic anticoagulation    due to aflutter  . Chronic kidney disease   . Diabetes mellitus   . Diastolic CHF, chronic ([place])    a.  echo 2006 - ef 55-65%; mild diast dysfxn;    b. Echo 08/2011: Mild LVH, EF 60%;  c. 04/2013 Echo: EF 65-69%, mild conc LVH;  08/2014 Echo: EF 60-65%, mild-mod MR.  . Gout   . Hyperlipidemia   . Hypertension    a.  Renal arterial Dopplers 12/2011: 1-59% right renal artery stenosis  . Morbid obesity ([place])   . Obstructive sleep apnea on CPAP   . Paroxysmal Afib/Flutter    a. 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 APPENDECTOMY    . ATRIAL FLUTTER ABLATION N/A 09/24/2011   Procedure: ATRIAL FLUTTER ABLATION;  Surgeon: [name], MD;  Location: [facility] CATH LAB;  Service: Cardiovascular;  Laterality: N/A;  . CARDIOVERSION  10/22/2011   Procedure: CARDIOVERSION;  Surgeon: [name], MD;  Location: [place];  Service: Cardiovascular;  Laterality: N/A;  . CARDIOVERSION N/A 09/10/2011   Procedure: CARDIOVERSION;  Surgeon: [name], MD;  Location: [facility] CATH LAB;  Service: Cardiovascular;  Laterality: N/A;  . CHOLECYSTECTOMY    . TONSILLECTOMY  1982  . TOTAL ABDOMINAL HYSTERECTOMY      There were no vitals filed for this  visit.   Subjective Assessment - 03/01/21 1028    Subjective Pt reports no new changes since last rx. States shoulder is extra sore this morning d/t rain.    Currently in Pain? Yes    Pain Score 6     Pain Location Shoulder    Pain Orientation Right                             [facility] Adult PT Treatment/Exercise - 03/01/21 0001      Shoulder Exercises: Seated   Extension Both;Theraband;20 reps;Strengthening    Theraband Level (Shoulder Extension) Level 1 (Yellow)   yellow d/t increased pain this rx   Row Both;20 reps;Theraband;Strengthening    Theraband Level (Shoulder Row) Level 1 (Yellow)    External Rotation Strengthening;Right;20 reps;Theraband    Theraband Level (Shoulder External Rotation) Level 1 (Yellow)    Internal Rotation Strengthening;Right;20 reps;Theraband    Theraband Level (Shoulder Internal Rotation) Level 1 (Yellow)    Flexion Right;20 reps    Flexion Weight (lbs) 1    Abduction Right;20 reps    ABduction Weight (lbs) 1      Shoulder Exercises: Standing   Other Standing Exercises AAROM 1lb WaTE bar flex,ext x10  Aortic atherosclerosis ([place]) 03/28/2020  . Diabetic peripheral neuropathy ([place]) 12/26/2019  . Intertrigo 10/23/2019  . Gastroesophageal reflux disease without esophagitis 03/17/2019  . Rash 03/17/2019  . Bilateral leg edema 02/16/2019  . Pruritus 02/16/2019  . Varicose veins of both legs with edema 02/16/2019  . Heart murmur 09/05/2018  . Restrictive lung disease 02/22/2018  . Dysgeusia 06/11/2017  . Allergic dermatitis 06/03/2017  . Osteoarthritis of left knee 06/03/2017  . NAFLD (nonalcoholic fatty liver disease) 08/07/2016  . Osteoporosis 12/31/2015  . Obesity 12/19/2015  . Hypersomnia 12/19/2015  . Edema 10/18/2015  . Acquired hypothyroidism 10/18/2015  . Acute diastolic congestive heart failure, NYHA class 3 ([place]) 10/03/2013  . Prolonged QT interval 10/03/2013  . Acute diastolic heart failure ([place]) 10/03/2013  . Long term (current) use of anticoagulants 06/09/2012  . BENIGN NEOPLASM OF ADRENAL GLAND 11/25/2010  . Chronic diastolic heart failure ([place]) 02/20/2010  . DM 05/16/2009  . GOUT 05/16/2009  . OBESITY, MORBID 05/16/2009  . Hypertension associated with diabetes ([place]) 05/16/2009  . Paroxysmal atrial fibrillation ([place]) 05/16/2009  . HYPERLIPIDEMIA 11/30/2008  . Obstructive sleep apnea on CPAP 11/30/2008   Amador Cunas, PT, DPT Donald Prose Rangel Echeverri 03/01/2021, 11:02 AM  [facility] [place]. [place], [place], [address] Phone: [phone]   Fax:  [phone]  Name: [name] MRN: [id number] Date  of Birth: February 14, 1941  Aortic atherosclerosis ([place]) 03/28/2020  . Diabetic peripheral neuropathy ([place]) 12/26/2019  . Intertrigo 10/23/2019  . Gastroesophageal reflux disease without esophagitis 03/17/2019  . Rash 03/17/2019  . Bilateral leg edema 02/16/2019  . Pruritus 02/16/2019  . Varicose veins of both legs with edema 02/16/2019  . Heart murmur 09/05/2018  . Restrictive lung disease 02/22/2018  . Dysgeusia 06/11/2017  . Allergic dermatitis 06/03/2017  . Osteoarthritis of left knee 06/03/2017  . NAFLD (nonalcoholic fatty liver disease) 08/07/2016  . Osteoporosis 12/31/2015  . Obesity 12/19/2015  . Hypersomnia 12/19/2015  . Edema 10/18/2015  . Acquired hypothyroidism 10/18/2015  . Acute diastolic congestive heart failure, NYHA class 3 ([place]) 10/03/2013  . Prolonged QT interval 10/03/2013  . Acute diastolic heart failure ([place]) 10/03/2013  . Long term (current) use of anticoagulants 06/09/2012  . BENIGN NEOPLASM OF ADRENAL GLAND 11/25/2010  . Chronic diastolic heart failure ([place]) 02/20/2010  . DM 05/16/2009  . GOUT 05/16/2009  . OBESITY, MORBID 05/16/2009  . Hypertension associated with diabetes ([place]) 05/16/2009  . Paroxysmal atrial fibrillation ([place]) 05/16/2009  . HYPERLIPIDEMIA 11/30/2008  . Obstructive sleep apnea on CPAP 11/30/2008   Amador Cunas, PT, DPT Donald Prose Rangel Echeverri 03/01/2021, 11:02 AM  [place] [place]. [place], [place], [address] Phone: [phone]   Fax:  [phone]  Name: [name] MRN: [id number] Date  of Birth: February 14, 1941

## 2021-03-01 NOTE — Assessment & Plan Note (Signed)
Currently euvolemic.  Continue furosemide.

## 2021-03-01 NOTE — Telephone Encounter (Signed)
Please contact her.  Let her know I received records from [facility].  This is a progress note that is titled "gastroenterology screening colonoscopy note". the title of that note is a bit misleading because during the visit they clearly discussed colon cancer screening and she declined colonoscopy.  "After much discussion she says she wants to do the Cologuard".  Cologuard testing was done and it was negative.  In the office last week she was absolutely certain that she has had a colonoscopy in the last for 5 years.  Let her know we reached out to [facility] and it does not appear to actually be the case.  If she has records otherwise or if she thinks she might of had a colonoscopy somewhere other than [facility] please ask her to get that information to [place].

## 2021-03-01 NOTE — Assessment & Plan Note (Addendum)
Well-controlled hypertension.  Continue losartan 100 mg daily and diuretics. Continue present diabetic medications.  No changes. Lab Results  Component Value Date   HGBA1C 7.4 (A) 11/29/2020

## 2021-03-01 NOTE — Telephone Encounter (Signed)
Phone call to patient to make her aware that we requested records from [facility]. Patient advised that medical records department sent an office note discussing colon cancer screening by colonoscopy, which was declined and Cologuard testing was ordered. Cologuard completed and was negative.    Patient states after she had Cologuard testing she did have a colonoscopy completed.  Patient states she has retrieved colonoscopy records herself and will bring them by our office next week for Dr Ardis Hughs to review.  Will wait for patient to bring records to our office.

## 2021-03-01 NOTE — Telephone Encounter (Signed)
Spoke with [name] and advised per [name] Standard, PA-C Verapamil could also be contributing to her swelling and she may try reducing to one tablet by mouth daily, continue current diuretic and keep 03/12/2021 appt with Chanetta Marshall, PA.  [name] states she will try reducing Verapamil to see if that helps with edema.  [name] verbalizes understanding and agrees with current plan.

## 2021-03-04 ENCOUNTER — Telehealth: Payer: Self-pay | Admitting: *Deleted

## 2021-03-04 ENCOUNTER — Ambulatory Visit: Payer: Medicare Other | Admitting: Physical Therapy

## 2021-03-04 ENCOUNTER — Other Ambulatory Visit: Payer: Medicare Other

## 2021-03-04 NOTE — Chronic Care Management (AMB) (Signed)
  Chronic Care Management   Outreach Note  03/04/2021 Name: [name] MRN: [id number] DOB: 30-Nov-1940  [name] is a 80 y.o. year old female who is a primary care patient of Sagardia, Ines Bloomer, MD. I reached out to [name] by phone today in response to a referral sent by Ms. Annistyn Felkins's PCP, Horald Pollen, MD.     A telephone outreach was attempted today. The patient was referred to the case management team for assistance with care management and care coordination.   Follow Up Plan: The care management team will reach out to the patient again over the next 7 days.  If patient returns call to provider office, please advise to call [place] at [phone].  [place] [facility]

## 2021-03-06 ENCOUNTER — Other Ambulatory Visit: Payer: Self-pay

## 2021-03-06 ENCOUNTER — Encounter: Payer: Self-pay | Admitting: Physical Therapy

## 2021-03-06 ENCOUNTER — Ambulatory Visit: Payer: Medicare Other | Admitting: Physical Therapy

## 2021-03-06 DIAGNOSIS — R262 Difficulty in walking, not elsewhere classified: Secondary | ICD-10-CM | POA: Diagnosis not present

## 2021-03-06 DIAGNOSIS — G8929 Other chronic pain: Secondary | ICD-10-CM | POA: Diagnosis not present

## 2021-03-06 DIAGNOSIS — M6281 Muscle weakness (generalized): Secondary | ICD-10-CM

## 2021-03-06 DIAGNOSIS — M25611 Stiffness of right shoulder, not elsewhere classified: Secondary | ICD-10-CM | POA: Diagnosis not present

## 2021-03-06 DIAGNOSIS — M25562 Pain in left knee: Secondary | ICD-10-CM | POA: Diagnosis not present

## 2021-03-06 DIAGNOSIS — M25511 Pain in right shoulder: Secondary | ICD-10-CM | POA: Diagnosis not present

## 2021-03-06 DIAGNOSIS — M25561 Pain in right knee: Secondary | ICD-10-CM | POA: Diagnosis not present

## 2021-03-06 NOTE — Therapy (Signed)
classified  Muscle weakness (generalized)     Problem List Patient Active Problem List   Diagnosis Date Noted  . Dysuria 03/01/2021  . Primary osteoarthritis involving multiple joints 03/01/2021  . Recurrent falls 01/10/2021  . Sensorineural hearing loss (SNHL), 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  . Persistent atrial fibrillation ([place]) 08/12/2020  . Type 2 diabetes mellitus with hyperglycemia, with long-term current use of insulin ([place]) 06/21/2020  . Type 2 diabetes mellitus with diabetic polyneuropathy, with long-term current use of insulin ([place]) 06/21/2020  . Mixed hyperlipidemia 06/21/2020  . Uncontrolled hypertension 06/11/2020  . Aortic atherosclerosis ([place]) 03/28/2020  . Diabetic peripheral neuropathy ([place]) 12/26/2019  . Intertrigo 10/23/2019  . Gastroesophageal reflux disease without esophagitis 03/17/2019  . Rash 03/17/2019  . Bilateral leg edema 02/16/2019  . Pruritus 02/16/2019  . Varicose veins of both legs with edema 02/16/2019  . Heart murmur 09/05/2018  . Restrictive lung disease 02/22/2018  . Dysgeusia 06/11/2017  . Allergic dermatitis 06/03/2017  . Osteoarthritis of left knee 06/03/2017  . NAFLD (nonalcoholic fatty liver disease) 08/07/2016  . Osteoporosis 12/31/2015  . Obesity 12/19/2015  . Hypersomnia 12/19/2015  . Edema 10/18/2015  . Acquired hypothyroidism 10/18/2015  . Acute diastolic congestive heart failure, NYHA class 3 ([place]) 10/03/2013  . Prolonged QT interval 10/03/2013  . Acute diastolic heart failure ([place]) 10/03/2013  . Long term (current) use of anticoagulants 06/09/2012  . BENIGN NEOPLASM OF ADRENAL GLAND 11/25/2010  . Chronic diastolic heart failure ([place]) 02/20/2010  . DM 05/16/2009  .  GOUT 05/16/2009  . OBESITY, MORBID 05/16/2009  . Hypertension associated with diabetes ([place]) 05/16/2009  . Paroxysmal atrial fibrillation ([place]) 05/16/2009  . HYPERLIPIDEMIA 11/30/2008  . Obstructive sleep apnea on CPAP 11/30/2008   Amador Cunas, PT, DPT [facility] [place] 03/06/2021, 1:48 PM  [address] [place], [place], [address] Phone: [phone]   Fax:  [phone]  Name: [name] MRN: [id number] Date of Birth: 1941-05-09  [address] [place], [place], [address] Phone: [phone]   Fax:  [phone]  Physical Therapy Treatment  Patient Details  Name: [name] MRN: [id number] Date of Birth: [birth year] Referring Provider (PT): Ninfa Linden   Encounter Date: 03/06/2021   PT End of Session - 03/06/21 1345    Visit Number 4    Date for PT Re-Evaluation 05/14/21    PT Start Time 1310    PT Stop Time 1356    PT Time Calculation (min) 46 min    Activity Tolerance Patient limited by pain    Behavior During Therapy [facility] for tasks assessed/performed           Past Medical History:  Diagnosis Date  . Arthritis   . Back pain   . Chronic anticoagulation    due to aflutter  . Chronic kidney disease   . Diabetes mellitus   . Diastolic CHF, chronic ([place])    a.  echo 2006 - ef 55-65%; mild diast dysfxn;    b. Echo 08/2011: Mild LVH, EF 60%;  c. 04/2013 Echo: EF 65-69%, mild conc LVH;  08/2014 Echo: EF 60-65%, mild-mod MR.  . Gout   . Hyperlipidemia   . Hypertension    a.  Renal arterial Dopplers 12/2011: 1-59% right renal artery stenosis  . Morbid obesity ([place])   . Obstructive sleep apnea on CPAP   . Paroxysmal Afib/Flutter    a. 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 APPENDECTOMY    . ATRIAL FLUTTER ABLATION N/A 09/24/2011   Procedure: ATRIAL FLUTTER ABLATION;  Surgeon: [name], MD;  Location: [facility] CATH LAB;  Service: Cardiovascular;  Laterality: N/A;  . CARDIOVERSION  10/22/2011   Procedure: CARDIOVERSION;  Surgeon: [name], MD;  Location: [facility];  Service: Cardiovascular;  Laterality: N/A;  . CARDIOVERSION N/A 09/10/2011   Procedure: CARDIOVERSION;  Surgeon: [name], MD;  Location: [facility] CATH LAB;  Service: Cardiovascular;  Laterality: N/A;  . CHOLECYSTECTOMY    . TONSILLECTOMY  1982  . TOTAL ABDOMINAL HYSTERECTOMY      There were no vitals filed for this  visit.   Subjective Assessment - 03/06/21 1309    Subjective Pt states that she was reaching to get a towel last night with R hand and heard popping followed by very sharp pain. Rates at 8/10. Her husband put some ointment on shoulder and it felt a little better when she woke up this morning. Rates current pain at 6/10.    Currently in Pain? Yes    Pain Score 6     Pain Location Shoulder    Pain Orientation Right                             [facility] Adult PT Treatment/Exercise - 03/06/21 0001      Shoulder Exercises: Seated   Extension Both;Theraband;20 reps;Strengthening    Theraband Level (Shoulder Extension) Level 1 (Yellow)    Row Both;20 reps;Theraband;Strengthening    Theraband Level (Shoulder Row) Level 1 (Yellow)    External Rotation Both;20 reps    Theraband Level (Shoulder External Rotation) Level 1 (Yellow)    Internal Rotation Strengthening;Right;20 reps;Theraband    Theraband Level (Shoulder Internal Rotation) Level 1 (Yellow)    Flexion Right;10 reps    Theraband Level (Shoulder Flexion) Level 1 (Yellow)    Abduction Right;10 reps  [address] [place], [place], [address] Phone: [phone]   Fax:  [phone]  Physical Therapy Treatment  Patient Details  Name: [name] MRN: [id number] Date of Birth: [birth year] Referring Provider (PT): Ninfa Linden   Encounter Date: 03/06/2021   PT End of Session - 03/06/21 1345    Visit Number 4    Date for PT Re-Evaluation 05/14/21    PT Start Time 1310    PT Stop Time 1356    PT Time Calculation (min) 46 min    Activity Tolerance Patient limited by pain    Behavior During Therapy [facility] for tasks assessed/performed           Past Medical History:  Diagnosis Date  . Arthritis   . Back pain   . Chronic anticoagulation    due to aflutter  . Chronic kidney disease   . Diabetes mellitus   . Diastolic CHF, chronic ([place])    a.  echo 2006 - ef 55-65%; mild diast dysfxn;    b. Echo 08/2011: Mild LVH, EF 60%;  c. 04/2013 Echo: EF 65-69%, mild conc LVH;  08/2014 Echo: EF 60-65%, mild-mod MR.  . Gout   . Hyperlipidemia   . Hypertension    a.  Renal arterial Dopplers 12/2011: 1-59% right renal artery stenosis  . Morbid obesity ([place])   . Obstructive sleep apnea on CPAP   . Paroxysmal Afib/Flutter    a. 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 APPENDECTOMY    . ATRIAL FLUTTER ABLATION N/A 09/24/2011   Procedure: ATRIAL FLUTTER ABLATION;  Surgeon: [name], MD;  Location: [facility] CATH LAB;  Service: Cardiovascular;  Laterality: N/A;  . CARDIOVERSION  10/22/2011   Procedure: CARDIOVERSION;  Surgeon: [name], MD;  Location: [facility];  Service: Cardiovascular;  Laterality: N/A;  . CARDIOVERSION N/A 09/10/2011   Procedure: CARDIOVERSION;  Surgeon: [name], MD;  Location: [facility] CATH LAB;  Service: Cardiovascular;  Laterality: N/A;  . CHOLECYSTECTOMY    . TONSILLECTOMY  1982  . TOTAL ABDOMINAL HYSTERECTOMY      There were no vitals filed for this  visit.   Subjective Assessment - 03/06/21 1309    Subjective Pt states that she was reaching to get a towel last night with R hand and heard popping followed by very sharp pain. Rates at 8/10. Her husband put some ointment on shoulder and it felt a little better when she woke up this morning. Rates current pain at 6/10.    Currently in Pain? Yes    Pain Score 6     Pain Location Shoulder    Pain Orientation Right                             [facility] Adult PT Treatment/Exercise - 03/06/21 0001      Shoulder Exercises: Seated   Extension Both;Theraband;20 reps;Strengthening    Theraband Level (Shoulder Extension) Level 1 (Yellow)    Row Both;20 reps;Theraband;Strengthening    Theraband Level (Shoulder Row) Level 1 (Yellow)    External Rotation Both;20 reps    Theraband Level (Shoulder External Rotation) Level 1 (Yellow)    Internal Rotation Strengthening;Right;20 reps;Theraband    Theraband Level (Shoulder Internal Rotation) Level 1 (Yellow)    Flexion Right;10 reps    Theraband Level (Shoulder Flexion) Level 1 (Yellow)    Abduction Right;10 reps

## 2021-03-11 ENCOUNTER — Ambulatory Visit (INDEPENDENT_AMBULATORY_CARE_PROVIDER_SITE_OTHER): Payer: Medicare Other | Admitting: Emergency Medicine

## 2021-03-11 ENCOUNTER — Encounter: Payer: Self-pay | Admitting: Physical Therapy

## 2021-03-11 ENCOUNTER — Other Ambulatory Visit: Payer: Self-pay

## 2021-03-11 ENCOUNTER — Ambulatory Visit: Payer: Medicare Other | Admitting: Physical Therapy

## 2021-03-11 VITALS — BP 124/80 | HR 62 | Temp 98.5°F | Ht 61.0 in | Wt 251.0 lb

## 2021-03-11 DIAGNOSIS — M25611 Stiffness of right shoulder, not elsewhere classified: Secondary | ICD-10-CM | POA: Diagnosis not present

## 2021-03-11 DIAGNOSIS — I152 Hypertension secondary to endocrine disorders: Secondary | ICD-10-CM

## 2021-03-11 DIAGNOSIS — I5032 Chronic diastolic (congestive) heart failure: Secondary | ICD-10-CM

## 2021-03-11 DIAGNOSIS — E1142 Type 2 diabetes mellitus with diabetic polyneuropathy: Secondary | ICD-10-CM | POA: Diagnosis not present

## 2021-03-11 DIAGNOSIS — E1159 Type 2 diabetes mellitus with other circulatory complications: Secondary | ICD-10-CM | POA: Diagnosis not present

## 2021-03-11 DIAGNOSIS — E782 Mixed hyperlipidemia: Secondary | ICD-10-CM

## 2021-03-11 DIAGNOSIS — R300 Dysuria: Secondary | ICD-10-CM

## 2021-03-11 DIAGNOSIS — Z7901 Long term (current) use of anticoagulants: Secondary | ICD-10-CM | POA: Diagnosis not present

## 2021-03-11 DIAGNOSIS — I480 Paroxysmal atrial fibrillation: Secondary | ICD-10-CM

## 2021-03-11 DIAGNOSIS — R262 Difficulty in walking, not elsewhere classified: Secondary | ICD-10-CM | POA: Diagnosis not present

## 2021-03-11 DIAGNOSIS — M25511 Pain in right shoulder: Secondary | ICD-10-CM | POA: Diagnosis not present

## 2021-03-11 DIAGNOSIS — M25561 Pain in right knee: Secondary | ICD-10-CM | POA: Diagnosis not present

## 2021-03-11 DIAGNOSIS — F341 Dysthymic disorder: Secondary | ICD-10-CM

## 2021-03-11 DIAGNOSIS — M6281 Muscle weakness (generalized): Secondary | ICD-10-CM

## 2021-03-11 DIAGNOSIS — I700 Atherosclerosis of aorta: Secondary | ICD-10-CM

## 2021-03-11 DIAGNOSIS — M25562 Pain in left knee: Secondary | ICD-10-CM | POA: Diagnosis not present

## 2021-03-11 DIAGNOSIS — G8929 Other chronic pain: Secondary | ICD-10-CM | POA: Diagnosis not present

## 2021-03-11 LAB — POCT URINALYSIS DIP (MANUAL ENTRY)
Bilirubin, UA: NEGATIVE
Glucose, UA: NEGATIVE mg/dL
Ketones, POC UA: NEGATIVE mg/dL
Nitrite, UA: NEGATIVE
Protein Ur, POC: NEGATIVE mg/dL
Spec Grav, UA: 1.015 (ref 1.010–1.025)
Urobilinogen, UA: 0.2 E.U./dL
pH, UA: 6 (ref 5.0–8.0)

## 2021-03-11 MED ORDER — AMOXICILLIN-POT CLAVULANATE 875-125 MG PO TABS: 1.0000 | ORAL_TABLET | Freq: Two times a day (BID) | ORAL | 0 refills | Status: AC

## 2021-03-11 NOTE — Patient Instructions (Signed)
Health Maintenance After Age 78 After age 78, you are at a higher risk for certain long-term diseases and infections as well as injuries from falls. Falls are a major cause of broken bones and head injuries in people who are older than age 78. Getting regular preventive care can help to keep you healthy and well. Preventive care includes getting regular testing and making lifestyle changes as recommended by your health care provider. Talk with your health care provider about:  Which screenings and tests you should have. A screening is a test that checks for a disease when you have no symptoms.  A diet and exercise plan that is right for you. What should I know about screenings and tests to prevent falls? Screening and testing are the best ways to find a health problem early. Early diagnosis and treatment give you the best chance of managing medical conditions that are common after age 78. Certain conditions and lifestyle choices may make you more likely to have a fall. Your health care provider may recommend:  Regular vision checks. Poor vision and conditions such as cataracts can make you more likely to have a fall. If you wear glasses, make sure to get your prescription updated if your vision changes.  Medicine review. Work with your health care provider to regularly review all of the medicines you are taking, including over-the-counter medicines. Ask your health care provider about any side effects that may make you more likely to have a fall. Tell your health care provider if any medicines that you take make you feel dizzy or sleepy.  Osteoporosis screening. Osteoporosis is a condition that causes the bones to get weaker. This can make the bones weak and cause them to break more easily.  Blood pressure screening. Blood pressure changes and medicines to control blood pressure can make you feel dizzy.  Strength and balance checks. Your health care provider may recommend certain tests to check your  strength and balance while standing, walking, or changing positions.  Foot health exam. Foot pain and numbness, as well as not wearing proper footwear, can make you more likely to have a fall.  Depression screening. You may be more likely to have a fall if you have a fear of falling, feel emotionally low, or feel unable to do activities that you used to do.  Alcohol use screening. Using too much alcohol can affect your balance and may make you more likely to have a fall. What actions can I take to lower my risk of falls? General instructions  Talk with your health care provider about your risks for falling. Tell your health care provider if: ? You fall. Be sure to tell your health care provider about all falls, even ones that seem minor. ? You feel dizzy, sleepy, or off-balance.  Take over-the-counter and prescription medicines only as told by your health care provider. These include any supplements.  Eat a healthy diet and maintain a healthy weight. A healthy diet includes low-fat dairy products, low-fat (lean) meats, and fiber from whole grains, beans, and lots of fruits and vegetables. Home safety  Remove any tripping hazards, such as rugs, cords, and clutter.  Install safety equipment such as grab bars in bathrooms and safety rails on stairs.  Keep rooms and walkways well-lit. Activity  Follow a regular exercise program to stay fit. This will help you maintain your balance. Ask your health care provider what types of exercise are appropriate for you.  If you need a cane or walker,   use it as recommended by your health care provider.  Wear supportive shoes that have nonskid soles.   Lifestyle  Do not drink alcohol if your health care provider tells you not to drink.  If you drink alcohol, limit how much you have: ? 0-1 drink a day for women. ? 0-2 drinks a day for men.  Be aware of how much alcohol is in your drink. In the [place] one drink equals one typical bottle of beer (12  oz), one-half glass of wine (5 oz), or one shot of hard liquor (1 oz).  Do not use any products that contain nicotine or tobacco, such as cigarettes and e-cigarettes. If you need help quitting, ask your health care provider. Summary  Having a healthy lifestyle and getting preventive care can help to protect your health and wellness after age 78.  Screening and testing are the best way to find a health problem early and help you avoid having a fall. Early diagnosis and treatment give you the best chance for managing medical conditions that are more common for people who are older than age 78.  Falls are a major cause of broken bones and head injuries in people who are older than age 78. Take precautions to prevent a fall at home.  Work with your health care provider to learn what changes you can make to improve your health and wellness and to prevent falls. This information is not intended to replace advice given to you by your health care provider. Make sure you discuss any questions you have with your health care provider. Document Revised: 01/27/2019 Document Reviewed: 08/19/2017 Elsevier Patient Education  2021 [employer]  

## 2021-03-11 NOTE — Progress Notes (Signed)
[name] 80 y.o.   Chief Complaint  Patient presents with  . Hypertension    BP and diabetic check.  . Dysuria    Pt states she still having pressure after urination, pt seen for dysuria 02/28/21. Finish antibiotic.    HISTORY OF PRESENT ILLNESS: This is a 80 y.o. female with multiple chronic medical problems including diabetes and hypertension here for follow-up. Lab Results  Component Value Date   HGBA1C 7.4 (A) 11/29/2020  Mostly concerned about persistent lower urinary tract symptoms.  Recently seen by me on 02/28/2021 and started on Ceftin 500 mg twice a day for 7 days which she finished.  Still having some residual symptoms.  Denies fever or chills.  Denies flank pain.  Denies nausea or vomiting. Also requesting referral to psychiatrist for chronic depression and anxiety symptoms. No other complaints or medical concerns today.  HPI   Prior to Admission medications   Medication Sig Start Date End Date Taking? Authorizing Provider  Accu-Chek Softclix Lancets lancets 1 each by Other route 3 (three) times daily. as directed 03/30/20  Yes Maximiano Coss, NP  allopurinol (ZYLOPRIM) 300 MG tablet TAKE 1 TABLET(300 MG) BY MOUTH DAILY 01/21/21  Yes Lilya Smitherman, Ines Bloomer, MD  amiodarone (PACERONE) 200 MG tablet Take 1 tablet (200 mg total) by mouth daily. 07/18/20  Yes Sherran Needs, NP  blood glucose meter kit and supplies Dispense based on patient and insurance preference. Use up to four times daily as directed. (FOR ICD-10 E10.9, E11.9). 05/03/20  Yes Tynia Wiers, Ines Bloomer, MD  cloNIDine (CATAPRES) 0.3 MG tablet TAKE 1 TABLET(0.3 MG) BY MOUTH TWICE DAILY 01/21/21  Yes Exa Bomba, Ines Bloomer, MD  Continuous Blood Gluc Receiver (FREESTYLE LIBRE 14 DAY READER) DEVI Use as directed. 04/18/20  Yes Kailan Carmen, Ines Bloomer, MD  Continuous Blood Gluc Sensor (FREESTYLE LIBRE 14 DAY SENSOR) MISC Use as directed to check blood sugar daily 09/24/20  Yes [name], Melanie Crazier, MD  diltiazem  (CARDIZEM) 30 MG tablet Take 30 mg by mouth. Take one tablet every 4 hours as needed for heart rate greater than 100 and blood pressure needs to be above 100   Yes Sherran Needs, NP  ELIQUIS 5 MG TABS tablet TAKE 1 TABLET(5 MG) BY MOUTH TWICE DAILY 12/14/20  Yes [name], MD  Insulin Lispro Prot & Lispro (HUMALOG MIX 75/25 KWIKPEN) (75-25) 100 UNIT/ML Kwikpen Inject 15 Units into the skin 2 (two) times daily. 01/10/21  Yes [name], Melanie Crazier, MD  levothyroxine (SYNTHROID) 75 MCG tablet Take 1 tablet (75 mcg total) by mouth daily before breakfast. 01/23/21 04/23/21 Yes Jeannett Dekoning, Ines Bloomer, MD  lidocaine (LIDODERM) 5 % Place 1 patch onto the skin daily. Remove & Discard patch within 12 hours or as directed by MD 08/29/20  Yes Mcarthur Rossetti, MD  loperamide (IMODIUM) 2 MG capsule Take 1 capsule (2 mg total) by mouth 4 (four) times daily as needed for diarrhea or loose stools. 03/20/20  Yes Garald Balding, PA-C  losartan (COZAAR) 100 MG tablet Take 1 tablet (100 mg total) by mouth daily. 05/31/20  Yes Teonna Coonan, Ines Bloomer, MD  Na Sulfate-K Sulfate-Mg Sulf (SUPREP BOWEL PREP KIT) 17.5-3.13-1.6 GM/177ML SOLN Take 1 kit by mouth as directed. 02/27/21  Yes Milus Banister, MD  NOVOFINE PLUS 32G X 4 MM MISC 1 each by Subdermal route 2 (two) times daily. as directed 03/30/20  Yes Maximiano Coss, NP  [facility] VERIO test strip 1 each 3 (three) times daily. 06/08/20  Yes  [provider]  potassium chloride (KLOR-CON) 10 MEQ tablet Take 2 tablets (20 mEq total) by mouth daily. 10/18/19  Yes [name], MD  RESTASIS MULTIDOSE 0.05 % ophthalmic emulsion Place 1 drop into both eyes 2 (two) times daily.  03/04/20  Yes [provider]  rosuvastatin (CRESTOR) 10 MG tablet Take 10 mg by mouth every evening.   Yes [provider]  Semaglutide,0.25 or 0.5MG/DOS, (OZEMPIC, 0.25 OR 0.5 MG/DOSE,) 2 MG/1.5ML SOPN Inject 0.75 mLs (1 mg total) into the skin once a week. 04/18/20   Yes Kiyona Mcnall, Ines Bloomer, MD  tiZANidine (ZANAFLEX) 4 MG tablet Take 1 tablet (4 mg total) by mouth every 8 (eight) hours as needed for muscle spasms. 08/29/20  Yes Mcarthur Rossetti, MD  torsemide (DEMADEX) 20 MG tablet TAKE 2 TABLETS(40 MG) BY MOUTH DAILY 11/07/20  Yes [name], MD  triamcinolone cream (KENALOG) 0.1 % Apply 1 application topically 2 (two) times daily. 07/26/20  Yes Blanca Carreon, Ines Bloomer, MD  verapamil (CALAN) 120 MG tablet Take 1 tablet (120 mg total) by mouth 2 (two) times daily. 08/15/20  Yes [name], MD    Allergies  Allergen Reactions  . Metformin Diarrhea    Patient Active Problem List   Diagnosis Date Noted  . Dysuria 03/01/2021  . Primary osteoarthritis involving multiple joints 03/01/2021  . Recurrent falls 01/10/2021  . Sensorineural hearing loss (SNHL), 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  . Persistent atrial fibrillation ([place]) 08/12/2020  . Type 2 diabetes mellitus with hyperglycemia, with long-term current use of insulin ([place]) 06/21/2020  . Type 2 diabetes mellitus with diabetic polyneuropathy, with long-term current use of insulin ([place]) 06/21/2020  . Mixed hyperlipidemia 06/21/2020  . Uncontrolled hypertension 06/11/2020  . Aortic atherosclerosis ([place]) 03/28/2020  . Diabetic peripheral neuropathy ([place]) 12/26/2019  . Intertrigo 10/23/2019  . Gastroesophageal reflux disease without esophagitis 03/17/2019  . Rash 03/17/2019  . Bilateral leg edema 02/16/2019  . Pruritus 02/16/2019  . Varicose veins of both legs with edema 02/16/2019  . Heart murmur 09/05/2018  . Restrictive lung disease 02/22/2018  . Dysgeusia 06/11/2017  . Allergic dermatitis 06/03/2017  . Osteoarthritis of left knee 06/03/2017  . NAFLD (nonalcoholic fatty liver disease) 08/07/2016  . Osteoporosis 12/31/2015  . Obesity 12/19/2015  . Hypersomnia 12/19/2015  . Edema 10/18/2015  . Acquired hypothyroidism 10/18/2015  . Acute diastolic congestive heart failure, NYHA class 3 ([place])  10/03/2013  . Prolonged QT interval 10/03/2013  . Acute diastolic heart failure ([place]) 10/03/2013  . Long term (current) use of anticoagulants 06/09/2012  . BENIGN NEOPLASM OF ADRENAL GLAND 11/25/2010  . Chronic diastolic heart failure ([place]) 02/20/2010  . DM 05/16/2009  . GOUT 05/16/2009  . OBESITY, MORBID 05/16/2009  . Hypertension associated with diabetes ([place]) 05/16/2009  . Paroxysmal atrial fibrillation ([place]) 05/16/2009  . HYPERLIPIDEMIA 11/30/2008  . Obstructive sleep apnea on CPAP 11/30/2008    Past Medical History:  Diagnosis Date  . Arthritis   . Back pain   . Chronic anticoagulation    due to aflutter  . Chronic kidney disease   . Diabetes mellitus   . Diastolic CHF, chronic ([place])    a.  echo 2006 - ef 55-65%; mild diast dysfxn;    b. Echo 08/2011: Mild LVH, EF 60%;  c. 04/2013 Echo: EF 65-69%, mild conc LVH;  08/2014 Echo: EF 60-65%, mild-mod MR.  . Gout   . Hyperlipidemia   . Hypertension    a.  Renal arterial Dopplers 12/2011: 1-59% right renal artery  stenosis  . Morbid obesity ([place])   . Obstructive sleep apnea on CPAP   . Paroxysmal Afib/Flutter    a. 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 APPENDECTOMY    . ATRIAL FLUTTER ABLATION N/A 09/24/2011   Procedure: ATRIAL FLUTTER ABLATION;  Surgeon: [name], MD;  Location: [facility] CATH LAB;  Service: Cardiovascular;  Laterality: N/A;  . CARDIOVERSION  10/22/2011   Procedure: CARDIOVERSION;  Surgeon: [name], MD;  Location: [place];  Service: Cardiovascular;  Laterality: N/A;  . CARDIOVERSION N/A 09/10/2011   Procedure: CARDIOVERSION;  Surgeon: [name], MD;  Location: [facility] CATH LAB;  Service: Cardiovascular;  Laterality: N/A;  . CHOLECYSTECTOMY    . TONSILLECTOMY  1982  . TOTAL ABDOMINAL HYSTERECTOMY      Social History   Socioeconomic History  . Marital status: Married    Spouse name: Not on file  . Number of children: 3  . Years of education:  Not on file  . Highest education level: Not on file  Occupational History  . Occupation: DISABILITY/housewife    Employer: RETIRED  Tobacco Use  . Smoking status: Never Smoker  . Smokeless tobacco: Never Used  Vaping Use  . Vaping Use: Never used  Substance and Sexual Activity  . Alcohol use: No  . Drug use: No  . Sexual activity: Yes  Other Topics Concern  . Not on file  Social History Narrative  . Not on file   Social Determinants of Health   Financial Resource Strain: Not on file  Food Insecurity: Not on file  Transportation Needs: Not on file  Physical Activity: Not on file  Stress: Not on file  Social Connections: Not on file  Intimate Partner Violence: Not on file    Family History  Problem Relation Age of Onset  . Heart disease Father   . Hypertension Father   . Breast cancer Sister   . Cancer Sister        breast  . Colon cancer Neg Hx   . Esophageal cancer Neg Hx   . Pancreatic cancer Neg Hx   . Liver disease Neg Hx      Review of Systems  Constitutional: Negative.  Negative for chills and fever.  HENT: Negative.  Negative for congestion and sore throat.   Respiratory: Negative.  Negative for cough and shortness of breath.   Cardiovascular: Negative.  Negative for chest pain and palpitations.  Gastrointestinal: Negative for abdominal pain, diarrhea, nausea and vomiting.  Genitourinary: Negative.  Negative for dysuria and hematuria.  Skin: Negative.  Negative for rash.  Neurological: Negative.  Negative for dizziness and headaches.  All other systems reviewed and are negative.  Today's Vitals   03/11/21 1434  BP: 124/80  Pulse: 62  Temp: 98.5 F (36.9 C)  TempSrc: Oral  SpO2: 95%  Weight: 251 lb (113.9 kg)  Height: '5\' 1"'  (1.549 m)   Body mass index is 47.43 kg/m.   Physical Exam Vitals reviewed.  Constitutional:      Appearance: Normal appearance. She is obese.  HENT:     Head: Normocephalic.  Eyes:     Extraocular Movements:  Extraocular movements intact.     Pupils: Pupils are equal, round, and reactive to light.  Cardiovascular:     Rate and Rhythm: Normal rate. Rhythm irregular.     Pulses: Normal pulses.     Heart sounds: Normal heart sounds.  Pulmonary:  Effort: Pulmonary effort is normal.     Breath sounds: Normal breath sounds.  Musculoskeletal:     Cervical back: Normal range of motion.     Right lower leg: No edema.     Left lower leg: No edema.  Skin:    General: Skin is warm and dry.     Capillary Refill: Capillary refill takes less than 2 seconds.  Neurological:     General: No focal deficit present.     Mental Status: She is alert and oriented to person, place, and time.  Psychiatric:        Mood and Affect: Mood normal.    Results for orders placed or performed in visit on 03/11/21 (from the past 24 hour(s))  POCT urinalysis dipstick     Status: Abnormal   Collection Time: 03/11/21  3:21 PM  Result Value Ref Range   Color, UA yellow yellow   Clarity, UA clear clear   Glucose, UA negative negative mg/dL   Bilirubin, UA negative negative   Ketones, POC UA negative negative mg/dL   Spec Grav, UA 1.015 1.010 - 1.025   Blood, UA small (A) negative   pH, UA 6.0 5.0 - 8.0   Protein Ur, POC negative negative mg/dL   Urobilinogen, UA 0.2 0.2 or 1.0 E.U./dL   Nitrite, UA Negative Negative   Leukocytes, UA Small (1+) (A) Negative     ASSESSMENT & PLAN: Dysuria Persistent lower urinary tract symptoms.  Last urine culture showed mixed genital urethral normal flora.  Abnormal urinalysis today.  We will get a clean-catch midstream urine sample for culture.  No signs of systemic symptoms.  Afebrile.  No nausea or vomiting and no flank tenderness.  We will start Augmentin twice a day for 7 days.  Dysthymia Psychiatry referral placed today.   Zyia was seen today for hypertension and dysuria.  Diagnoses and all orders for this visit:  Dysuria -     POCT urinalysis dipstick -      Urine Culture -     amoxicillin-clavulanate (AUGMENTIN) 875-125 MG tablet; Take 1 tablet by mouth 2 (two) times daily for 7 days.  Dysthymia -     Ambulatory referral to Psychiatry  Hypertension associated with diabetes ([place]) -     Hemoglobin A1c; Future -     Lipid panel; Future  Paroxysmal atrial fibrillation (HCC)  Chronic diastolic heart failure (HCC)  Aortic atherosclerosis ([place])  Long term (current) use of anticoagulants  Mixed hyperlipidemia  Diabetic peripheral neuropathy [facility])    Patient Instructions   Health Maintenance After Age 64 After age 106, you are at a higher risk for certain long-term diseases and infections as well as injuries from falls. Falls are a major cause of broken bones and head injuries in people who are older than age 63. Getting regular preventive care can help to keep you healthy and well. Preventive care includes getting regular testing and making lifestyle changes as recommended by your health care provider. Talk with your health care provider about:  Which screenings and tests you should have. A screening is a test that checks for a disease when you have no symptoms.  A diet and exercise plan that is right for you. What should I know about screenings and tests to prevent falls? Screening and testing are the best ways to find a health problem early. Early diagnosis and treatment give you the best chance of managing medical conditions that are common after age 50. Certain conditions and lifestyle  choices may make you more likely to have a fall. Your health care provider may recommend:  Regular vision checks. Poor vision and conditions such as cataracts can make you more likely to have a fall. If you wear glasses, make sure to get your prescription updated if your vision changes.  Medicine review. Work with your health care provider to regularly review all of the medicines you are taking, including over-the-counter medicines. Ask your health care  provider about any side effects that may make you more likely to have a fall. Tell your health care provider if any medicines that you take make you feel dizzy or sleepy.  Osteoporosis screening. Osteoporosis is a condition that causes the bones to get weaker. This can make the bones weak and cause them to break more easily.  Blood pressure screening. Blood pressure changes and medicines to control blood pressure can make you feel dizzy.  Strength and balance checks. Your health care provider may recommend certain tests to check your strength and balance while standing, walking, or changing positions.  Foot health exam. Foot pain and numbness, as well as not wearing proper footwear, can make you more likely to have a fall.  Depression screening. You may be more likely to have a fall if you have a fear of falling, feel emotionally low, or feel unable to do activities that you used to do.  Alcohol use screening. Using too much alcohol can affect your balance and may make you more likely to have a fall. What actions can I take to lower my risk of falls? General instructions  Talk with your health care provider about your risks for falling. Tell your health care provider if: ? You fall. Be sure to tell your health care provider about all falls, even ones that seem minor. ? You feel dizzy, sleepy, or off-balance.  Take over-the-counter and prescription medicines only as told by your health care provider. These include any supplements.  Eat a healthy diet and maintain a healthy weight. A healthy diet includes low-fat dairy products, low-fat (lean) meats, and fiber from whole grains, beans, and lots of fruits and vegetables. Home safety  Remove any tripping hazards, such as rugs, cords, and clutter.  Install safety equipment such as grab bars in bathrooms and safety rails on stairs.  Keep rooms and walkways well-lit. Activity  Follow a regular exercise program to stay fit. This will help you  maintain your balance. Ask your health care provider what types of exercise are appropriate for you.  If you need a cane or walker, use it as recommended by your health care provider.  Wear supportive shoes that have nonskid soles.   Lifestyle  Do not drink alcohol if your health care provider tells you not to drink.  If you drink alcohol, limit how much you have: ? 0-1 drink a day for women. ? 0-2 drinks a day for men.  Be aware of how much alcohol is in your drink. In the [place] one drink equals one typical bottle of beer (12 oz), one-half glass of wine (5 oz), or one shot of hard liquor (1 oz).  Do not use any products that contain nicotine or tobacco, such as cigarettes and e-cigarettes. If you need help quitting, ask your health care provider. Summary  Having a healthy lifestyle and getting preventive care can help to protect your health and wellness after age 19.  Screening and testing are the best way to find a health problem early and help you  avoid having a fall. Early diagnosis and treatment give you the best chance for managing medical conditions that are more common for people who are older than age 71.  Falls are a major cause of broken bones and head injuries in people who are older than age 57. Take precautions to prevent a fall at home.  Work with your health care provider to learn what changes you can make to improve your health and wellness and to prevent falls. This information is not intended to replace advice given to you by your health care provider. Make sure you discuss any questions you have with your health care provider. Document Revised: 01/27/2019 Document Reviewed: 08/19/2017 Elsevier Patient Education  2021 [place], MD [facility]

## 2021-03-11 NOTE — Chronic Care Management (AMB) (Signed)
  Chronic Care Management   Note  03/11/2021 Name: [name] MRN: [id number] DOB: [birth year]  [name] is a 80 y.o. year old female who is a primary care patient of Sagardia, Ines Bloomer, MD. I reached out to [name] by phone today in response to a referral sent by [name]'s PCP, Horald Pollen, MD.     [name] was given information about Chronic Care Management services today including:  1. CCM service includes personalized support from designated clinical staff supervised by her physician, including individualized plan of care and coordination with other care providers 2. 24/7 contact phone numbers for assistance for urgent and routine care needs. 3. Service will only be billed when office clinical staff spend 20 minutes or more in a month to coordinate care. 4. Only one practitioner may furnish and bill the service in a calendar month. 5. The patient may stop CCM services at any time (effective at the end of the month) by phone call to the office staff. 6. The patient will be responsible for cost sharing (co-pay) of up to 20% of the service fee (after annual deductible is met).  Patient agreed to services and verbal consent obtained.   Follow up plan: Telephone appointment with care management team member scheduled for:03/25/2021  [place] [facility]

## 2021-03-11 NOTE — Therapy (Signed)
[place]. [place], [place], [address] Phone: [phone]   Fax:  [phone]  Physical Therapy Treatment  Patient Details  Name: [name] MRN: [id number] Date of Birth: [birth year] Referring Provider (PT): Hedy Camara Date: 03/11/2021   PT End of Session - 03/11/21 1136    Visit Number 5    Date for PT Re-Evaluation 05/14/21    PT Start Time 1058    PT Stop Time 1142    PT Time Calculation (min) 44 min    Activity Tolerance Patient tolerated treatment well;Patient limited by fatigue    Behavior During Therapy [facility] for tasks assessed/performed           Past Medical History:  Diagnosis Date  . Arthritis   . Back pain   . Chronic anticoagulation    due to aflutter  . Chronic kidney disease   . Diabetes mellitus   . Diastolic CHF, chronic ([place])    a.  echo 2006 - ef 55-65%; mild diast dysfxn;    b. Echo 08/2011: Mild LVH, EF 60%;  c. 04/2013 Echo: EF 65-69%, mild conc LVH;  08/2014 Echo: EF 60-65%, mild-mod MR.  . Gout   . Hyperlipidemia   . Hypertension    a.  Renal arterial Dopplers 12/2011: 1-59% right renal artery stenosis  . Morbid obesity ([place])   . Obstructive sleep apnea on CPAP   . Paroxysmal Afib/Flutter    a. 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 APPENDECTOMY    . ATRIAL FLUTTER ABLATION N/A 09/24/2011   Procedure: ATRIAL FLUTTER ABLATION;  Surgeon: [name], MD;  Location[facility] C[facility] LAB;  Service: Cardiovascular;  Laterality: N/A;  . CARDIOVERSION  10/22/2011   Procedure: CARDIOVERSION;  Surgeon: [name], MD;  Location: [place];  Service: Cardiovascular;  Laterality: N/A;  . CARDIOVERSION N/A 09/10/2011   Procedure: CARDIOVERSION;  Surgeon: [name], MD;  Location: [facility] CATH LAB;  Service: Cardiovascular;  Laterality: N/A;  . CHOLECYSTECTOMY    . TONSILLECTOMY  1982  . TOTAL ABDOMINAL HYSTERECTOMY       There were no vitals filed for this visit.   Subjective Assessment - 03/11/21 1055    Subjective "I guess Im ok" Shoulder been hurting all weekend    Currently in Pain? Yes    Pain Score 7     Pain Location Shoulder    Pain Orientation Right                             [facility] Adult PT Treatment/Exercise - 03/11/21 0001      Shoulder Exercises: Seated   Row Both;Theraband;Strengthening;15 reps   x2   Theraband Level (Shoulder Row) Level 2 (Red)    External Rotation Both;20 reps    Theraband Level (Shoulder External Rotation) Level 1 (Yellow)    Internal Rotation Strengthening;Right;20 reps;Theraband    Theraband Level (Shoulder Internal Rotation) Level 1 (Yellow)    Flexion 20 reps;Weights;Both    Flexion Weight (lbs) 1    Abduction Right;20 reps;Both;Strengthening;AROM      Shoulder Exercises: Standing   Extension Strengthening;20 reps;Theraband;Both    Theraband Level (Shoulder Extension) Level 1 (Yellow)    Other Standing Exercises bicep curls 2lb x15, x10      Shoulder Exercises: ROM/Strengthening   UBE (Upper Arm Bike) L1  x 3 min each  Moist Heat Therapy   Number Minutes Moist Heat 10 Minutes    Moist Heat Location Shoulder                    PT Short Term Goals - 03/11/21 1141      PT SHORT TERM GOAL #1   Title Pt will be I with initial HEP    Status Achieved             PT Long Term Goals - 03/11/21 1141      PT LONG TERM GOAL #2   Title Pt will demo understanding of posture/body mechanics    Status On-going      PT LONG TERM GOAL #3   Title Pt will report 50% reduction in R shoulder pain    Status On-going                 Plan - 03/11/21 1137    Clinical Impression Statement Pt reports not feeling well today, she thinks it is the weather. Increase fatigue noted today with activity. Tactile cues to R elbow for positioning needed with ER & IR. Some weakness present with resisted shoulder flexion. She was  unable to tolerated any resistance with seated shoulder abduction. Postural cues needed to maintain erect posture with shoulder Ext. Positive response to modality    Personal Factors and Comorbidities Comorbidity 3+    Comorbidities HTN, CHF, DM, afib, CKD    Examination-Activity Limitations Locomotion Level;Carry;Lift;Reach Overhead    Examination-Participation Restrictions Meal Prep;Cleaning;Laundry    Stability/Clinical Decision Making Evolving/Moderate complexity    Rehab Potential Good    PT Frequency 2x / week    PT Treatment/Interventions ADLs/Self Care Home Management;Electrical Stimulation;Iontophoresis 4mg /ml Dexamethasone;Moist Heat;Neuromuscular re-education;Therapeutic exercise;Therapeutic activities;Functional mobility training;Patient/family education;Manual techniques;Passive range of motion    PT Next Visit Plan Monitor vitals prior to beginning tx d/t heart conditions, strengthening of scapular stabilizers, postural mm, modalities/manual for pain control, functional compensation/adaptation, and incorporation of general conditioning/strengthening ex's           Patient will benefit from skilled therapeutic intervention in order to improve the following deficits and impairments:  Abnormal gait,Decreased range of motion,Difficulty walking,Cardiopulmonary status limiting activity,Decreased endurance,Obesity,Decreased activity tolerance,Pain,Hypomobility,Impaired flexibility,Decreased mobility,Decreased strength,Postural dysfunction  Visit Diagnosis: Muscle weakness (generalized)  Chronic pain of right knee  Stiffness of right shoulder, not elsewhere classified  Chronic right shoulder pain     Problem List Patient Active Problem List   Diagnosis Date Noted  . Dysuria 03/01/2021  . Primary osteoarthritis involving multiple joints 03/01/2021  . Recurrent falls 01/10/2021  . Sensorineural hearing loss (SNHL), 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  . Persistent atrial fibrillation ([place])  08/12/2020  . Type 2 diabetes mellitus with hyperglycemia, with long-term current use of insulin ([place]) 06/21/2020  . Type 2 diabetes mellitus with diabetic polyneuropathy, with long-term current use of insulin ([place]) 06/21/2020  . Mixed hyperlipidemia 06/21/2020  . Uncontrolled hypertension 06/11/2020  . Aortic atherosclerosis ([place]) 03/28/2020  . Diabetic peripheral neuropathy ([place]) 12/26/2019  . Intertrigo 10/23/2019  . Gastroesophageal reflux disease without esophagitis 03/17/2019  . Rash 03/17/2019  . Bilateral leg edema 02/16/2019  . Pruritus 02/16/2019  . Varicose veins of both legs with edema 02/16/2019  . Heart murmur 09/05/2018  . Restrictive lung disease 02/22/2018  . Dysgeusia 06/11/2017  . Allergic dermatitis 06/03/2017  . Osteoarthritis of left knee 06/03/2017  . NAFLD (nonalcoholic fatty liver disease) 08/07/2016  . Osteoporosis 12/31/2015  . Obesity 12/19/2015  . Hypersomnia 12/19/2015  . Edema 10/18/2015  .  Acquired hypothyroidism 10/18/2015  . Acute diastolic congestive heart failure, NYHA class 3 ([place]) 10/03/2013  . Prolonged QT interval 10/03/2013  . Acute diastolic heart failure ([place]) 10/03/2013  . Long term (current) use of anticoagulants 06/09/2012  . BENIGN NEOPLASM OF ADRENAL GLAND 11/25/2010  . Chronic diastolic heart failure ([place]) 02/20/2010  . DM 05/16/2009  . GOUT 05/16/2009  . OBESITY, MORBID 05/16/2009  . Hypertension associated with diabetes ([place]) 05/16/2009  . Paroxysmal atrial fibrillation ([place]) 05/16/2009  . HYPERLIPIDEMIA 11/30/2008  . Obstructive sleep apnea on CPAP 11/30/2008    Scot Jun, PTA 03/11/2021, 11:41 AM  [place]. [place], [place], [address] Phone: [phone]   Fax:  [phone]  Name: Tacara Hadlock MRN: [id number] Date of Birth: June 07, 1941

## 2021-03-12 ENCOUNTER — Other Ambulatory Visit: Payer: Self-pay | Admitting: Emergency Medicine

## 2021-03-12 ENCOUNTER — Encounter: Payer: Self-pay | Admitting: Emergency Medicine

## 2021-03-12 ENCOUNTER — Ambulatory Visit
Admission: RE | Admit: 2021-03-12 | Discharge: 2021-03-12 | Disposition: A | Discharge status: Home / Self Care | Payer: Medicare Other | Source: Ambulatory Visit | Attending: Emergency Medicine | Admitting: Emergency Medicine

## 2021-03-12 ENCOUNTER — Encounter: Payer: Self-pay | Admitting: Nurse Practitioner

## 2021-03-12 ENCOUNTER — Ambulatory Visit (INDEPENDENT_AMBULATORY_CARE_PROVIDER_SITE_OTHER): Payer: Medicare Other | Admitting: Nurse Practitioner

## 2021-03-12 VITALS — BP 136/64 | HR 68 | Ht 61.0 in | Wt 254.4 lb

## 2021-03-12 DIAGNOSIS — Z1231 Encounter for screening mammogram for malignant neoplasm of breast: Secondary | ICD-10-CM | POA: Diagnosis not present

## 2021-03-12 DIAGNOSIS — D6869 Other thrombophilia: Secondary | ICD-10-CM | POA: Diagnosis not present

## 2021-03-12 DIAGNOSIS — I10 Essential (primary) hypertension: Secondary | ICD-10-CM | POA: Diagnosis not present

## 2021-03-12 DIAGNOSIS — I4819 Other persistent atrial fibrillation: Secondary | ICD-10-CM

## 2021-03-12 DIAGNOSIS — F341 Dysthymic disorder: Secondary | ICD-10-CM | POA: Insufficient documentation

## 2021-03-12 LAB — URINE CULTURE

## 2021-03-12 NOTE — Assessment & Plan Note (Signed)
Persistent lower urinary tract symptoms.  Last urine culture showed mixed genital urethral normal flora.  Abnormal urinalysis today.  We will get a clean-catch midstream urine sample for culture.  No signs of systemic symptoms.  Afebrile.  No nausea or vomiting and no flank tenderness.  We will start Augmentin twice a day for 7 days.

## 2021-03-12 NOTE — Progress Notes (Signed)
Electrophysiology Office Note Date: 03/12/2021  ID:  Tanecia, Mccay [birth year], MRN [id number]  PCP: Horald Pollen, MD Electrophysiologist: Caryl Comes  CC: AF Follow up  [name] is a 80 y.o. female seen today for Dr Caryl Comes.  She presents today for routine electrophysiology followup.  Since last being seen in our clinic, the patient reports doing relatively well.  She denies chest pain, palpitations,  PND, orthopnea, nausea, vomiting, dizziness, syncope,  weight gain, or early satiety.  Past Medical History:  Diagnosis Date  . Arthritis   . Back pain   . Chronic anticoagulation    due to aflutter  . Chronic kidney disease   . Diabetes mellitus   . Diastolic CHF, chronic ([place])    a.  echo 2006 - ef 55-65%; mild diast dysfxn;    b. Echo 08/2011: Mild LVH, EF 60%;  c. 04/2013 Echo: EF 65-69%, mild conc LVH;  08/2014 Echo: EF 60-65%, mild-mod MR.  . Gout   . Hyperlipidemia   . Hypertension    a.  Renal arterial Dopplers 12/2011: 1-59% right renal artery stenosis  . Morbid obesity ([place])   . Obstructive sleep apnea on CPAP   . Paroxysmal Afib/Flutter    a. 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   Past Surgical History:  Procedure Laterality Date  . APPENDECTOMY    . ATRIAL FLUTTER ABLATION N/A 09/24/2011   Procedure: ATRIAL FLUTTER ABLATION;  Surgeon: [name], MD;  Location: [facility] CATH LAB;  Service: Cardiovascular;  Laterality: N/A;  . CARDIOVERSION  10/22/2011   Procedure: CARDIOVERSION;  Surgeon: [name], MD;  Location: [place];  Service: Cardiovascular;  Laterality: N/A;  . CARDIOVERSION N/A 09/10/2011   Procedure: CARDIOVERSION;  Surgeon: [name], MD;  Location: [facility] CATH LAB;  Service: Cardiovascular;  Laterality: N/A;  . CHOLECYSTECTOMY    . TONSILLECTOMY  1982  . TOTAL ABDOMINAL HYSTERECTOMY      Current Outpatient Medications  Medication Sig Dispense Refill  . Accu-Chek Softclix Lancets lancets 1 each by Other route 3 (three) times  daily. as directed 100 each 3  . allopurinol (ZYLOPRIM) 300 MG tablet TAKE 1 TABLET(300 MG) BY MOUTH DAILY 90 tablet 3  . amiodarone (PACERONE) 200 MG tablet Take 1 tablet (200 mg total) by mouth daily. 90 tablet 3  . amoxicillin-clavulanate (AUGMENTIN) 875-125 MG tablet Take 1 tablet by mouth 2 (two) times daily for 7 days. 14 tablet 0  . blood glucose meter kit and supplies Dispense based on patient and insurance preference. Use up to four times daily as directed. (FOR ICD-10 E10.9, E11.9). 1 each 0  . cloNIDine (CATAPRES) 0.3 MG tablet TAKE 1 TABLET(0.3 MG) BY MOUTH TWICE DAILY 180 tablet 3  . Continuous Blood Gluc Receiver (FREESTYLE LIBRE 14 DAY READER) DEVI Use as directed. 1 each 0  . Continuous Blood Gluc Sensor (FREESTYLE LIBRE 14 DAY SENSOR) MISC Use as directed to check blood sugar daily 2 each 3  . diltiazem (CARDIZEM) 30 MG tablet Take 30 mg by mouth. Take one tablet every 4 hours as needed for heart rate greater than 100 and blood pressure needs to be above 100    . ELIQUIS 5 MG TABS tablet TAKE 1 TABLET(5 MG) BY MOUTH TWICE DAILY 180 tablet 1  . Insulin Lispro Prot & Lispro (HUMALOG MIX 75/25 KWIKPEN) (75-25) 100 UNIT/ML Kwikpen Inject 15 Units into the skin 2 (two) times daily. 30 mL 3  . levothyroxine (SYNTHROID) 75 MCG tablet Take 1  tablet (75 mcg total) by mouth daily before breakfast. 90 tablet 1  . lidocaine (LIDODERM) 5 % Place 1 patch onto the skin daily. Remove & Discard patch within 12 hours or as directed by MD 30 patch 0  . loperamide (IMODIUM) 2 MG capsule Take 1 capsule (2 mg total) by mouth 4 (four) times daily as needed for diarrhea or loose stools. 12 capsule 0  . losartan (COZAAR) 100 MG tablet Take 1 tablet (100 mg total) by mouth daily. 90 tablet 3  . Na Sulfate-K Sulfate-Mg Sulf (SUPREP BOWEL PREP KIT) 17.5-3.13-1.6 GM/177ML SOLN Take 1 kit by mouth as directed. 324 mL 0  . NOVOFINE PLUS 32G X 4 MM MISC 1 each by Subdermal route 2 (two) times daily. as directed 60  each 3  . ONETOUCH VERIO test strip 1 each 3 (three) times daily.    . RESTASIS MULTIDOSE 0.05 % ophthalmic emulsion Place 1 drop into both eyes 2 (two) times daily.     . rosuvastatin (CRESTOR) 10 MG tablet Take 10 mg by mouth every evening.    . Semaglutide,0.25 or 0.5MG/DOS, (OZEMPIC, 0.25 OR 0.5 MG/DOSE,) 2 MG/1.5ML SOPN Inject 0.75 mLs (1 mg total) into the skin once a week. 12 pen 3  . tiZANidine (ZANAFLEX) 4 MG tablet Take 1 tablet (4 mg total) by mouth every 8 (eight) hours as needed for muscle spasms. 60 tablet 1  . torsemide (DEMADEX) 20 MG tablet TAKE 2 TABLETS(40 MG) BY MOUTH DAILY 180 tablet 3  . triamcinolone cream (KENALOG) 0.1 % Apply 1 application topically 2 (two) times daily. 30 g 0  . verapamil (CALAN) 120 MG tablet Take 1 tablet (120 mg total) by mouth 2 (two) times daily. 180 tablet 3  . potassium chloride (KLOR-CON) 10 MEQ tablet Take 2 tablets (20 mEq total) by mouth daily. (Patient not taking: Reported on 03/12/2021) 180 tablet 3   No current facility-administered medications for this visit.    Allergies:   Metformin   Social History: Social History   Socioeconomic History  . Marital status: Married    Spouse name: Not on file  . Number of children: 3  . Years of education: Not on file  . Highest education level: Not on file  Occupational History  . Occupation: DISABILITY/housewife    Employer: RETIRED  Tobacco Use  . Smoking status: Never Smoker  . Smokeless tobacco: Never Used  Vaping Use  . Vaping Use: Never used  Substance and Sexual Activity  . Alcohol use: No  . Drug use: No  . Sexual activity: Yes  Other Topics Concern  . Not on file  Social History Narrative  . Not on file   Social Determinants of Health   Financial Resource Strain: Not on file  Food Insecurity: Not on file  Transportation Needs: Not on file  Physical Activity: Not on file  Stress: Not on file  Social Connections: Not on file  Intimate Partner Violence: Not on file     Family History: Family History  Problem Relation Age of Onset  . Heart disease Father   . Hypertension Father   . Breast cancer Sister   . Cancer Sister        breast  . Colon cancer Neg Hx   . Esophageal cancer Neg Hx   . Pancreatic cancer Neg Hx   . Liver disease Neg Hx     Review of Systems: All other systems reviewed and are otherwise negative except as noted above.  Physical Exam: VS:  Ht _0  (1.549 m)   Wt 254 lb 6.4 oz (115.4 kg)   BMI 48.07 kg/m  , BMI Body mass index is 48.07 kg/m. Wt Readings from Last 3 Encounters:  03/12/21 254 lb 6.4 oz (115.4 kg)  03/11/21 251 lb (113.9 kg)  02/28/21 255 lb (115.7 kg)    GEN- The patient is well appearing, alert and oriented x 3 today.   HEENT: normocephalic, atraumatic; sclera clear, conjunctiva pink; hearing intact; oropharynx clear; neck supple  Lungs- Clear to ausculation bilaterally, normal work of breathing.  No wheezes, rales, rhonchi Heart- Regular rate and rhythm  GI- soft, non-tender, non-distended, bowel sounds present  Extremities- no clubbing, cyanosis, or edema  MS- no significant deformity or atrophy Skin- warm and dry, no rash or lesion  Psych- euthymic mood, full affect Neuro- strength and sensation are intact   EKG:  EKG is ordered today. The ekg ordered today shows sinus rhythm, rate 68  Recent Labs: 07/05/2020: TSH 2.050 09/12/2020: Magnesium 2.1 01/03/2021: Pro B Natriuretic peptide (BNP) 74.0 01/23/2021: ALT 13; BUN 16; Creatinine, Ser 0.80; Hemoglobin 11.9; Platelets 351.0; Potassium 3.4; Sodium 139    Other studies Reviewed: Additional studies/ records that were reviewed today include: Dr Olin Pia office notes  Assessment and Plan:  1.  Persistent atrial fibrillation Maintaining SR by symptoms and EKG today - she has had rare AF episodes for which she takes PRN Diltiazem Will check amio labs Continue amiodarone Continue Eliquis for CHADS2VASC of at least 3  2.  HTN Stable No  change required today  3.  Obesity Body mass index is 48.07 kg/m. Weight loss encouraged    Current medicines are reviewed at length with the patient today.   The patient does not have concerns regarding her medicines.  The following changes were made today:  none  Labs/ tests ordered today include:   No orders of the defined types were placed in this encounter.    Disposition:   Follow up with Dr Caryl Comes 6 months    Signed, Chanetta Marshall, NP 03/12/2021 11:20 AM   [place] [address] [phone] (office) [phone] (fax)

## 2021-03-12 NOTE — Assessment & Plan Note (Signed)
Psychiatry referral placed today.

## 2021-03-12 NOTE — Patient Instructions (Signed)
Medication Instructions:  Your physician recommends that you continue on your current medications as directed. Please refer to the Current Medication list given to you today.  Labwork: You will have labs drawn today:   CMP, CBC, TSH, Mg, ProBNP   Testing/Procedures: None ordered.  Follow-Up: Your physician recommends that you schedule a follow-up appointment in:   6 months with Dr. Caryl Comes   Any Other Special Instructions Will Be Listed Below (If Applicable).     If you need a refill on your cardiac medications before your next appointment, please call your pharmacy.

## 2021-03-13 ENCOUNTER — Encounter: Payer: Self-pay | Admitting: Orthopaedic Surgery

## 2021-03-13 ENCOUNTER — Ambulatory Visit (INDEPENDENT_AMBULATORY_CARE_PROVIDER_SITE_OTHER): Payer: Medicare Other | Admitting: Orthopaedic Surgery

## 2021-03-13 ENCOUNTER — Telehealth: Payer: Self-pay | Admitting: Emergency Medicine

## 2021-03-13 ENCOUNTER — Other Ambulatory Visit: Payer: Self-pay

## 2021-03-13 ENCOUNTER — Ambulatory Visit: Payer: Medicare Other | Admitting: Physical Therapy

## 2021-03-13 DIAGNOSIS — M25511 Pain in right shoulder: Secondary | ICD-10-CM

## 2021-03-13 LAB — COMPREHENSIVE METABOLIC PANEL
ALT: 17 IU/L (ref 0–32)
AST: 20 IU/L (ref 0–40)
Albumin/Globulin Ratio: 1.5 (ref 1.2–2.2)
Albumin: 4.2 g/dL (ref 3.7–4.7)
Alkaline Phosphatase: 125 IU/L — ABNORMAL HIGH (ref 44–121)
BUN/Creatinine Ratio: 22 (ref 12–28)
BUN: 16 mg/dL (ref 8–27)
Bilirubin Total: 0.4 mg/dL (ref 0.0–1.2)
CO2: 31 mmol/L — ABNORMAL HIGH (ref 20–29)
Calcium: 9.5 mg/dL (ref 8.7–10.3)
Chloride: 93 mmol/L — ABNORMAL LOW (ref 96–106)
Creatinine, Ser: 0.74 mg/dL (ref 0.57–1.00)
Globulin, Total: 2.8 g/dL (ref 1.5–4.5)
Glucose: 182 mg/dL — ABNORMAL HIGH (ref 65–99)
Potassium: 3.3 mmol/L — ABNORMAL LOW (ref 3.5–5.2)
Sodium: 141 mmol/L (ref 134–144)
Total Protein: 7 g/dL (ref 6.0–8.5)
eGFR: 83 mL/min/{1.73_m2} (ref >59)

## 2021-03-13 LAB — CBC
Hematocrit: 38.2 % (ref 34.0–46.6)
Hemoglobin: 12 g/dL (ref 11.1–15.9)
MCH: 26.8 pg (ref 26.6–33.0)
MCHC: 31.4 g/dL — ABNORMAL LOW (ref 31.5–35.7)
MCV: 85 fL (ref 79–97)
Platelets: 357 10*3/uL (ref 150–450)
RBC: 4.48 x10E6/uL (ref 3.77–5.28)
RDW: 14.7 % (ref 11.7–15.4)
WBC: 12.9 10*3/uL — ABNORMAL HIGH (ref 3.4–10.8)

## 2021-03-13 LAB — MAGNESIUM: Magnesium: 2.1 mg/dL (ref 1.6–2.3)

## 2021-03-13 LAB — TSH: TSH: 3.35 u[IU]/mL (ref 0.450–4.500)

## 2021-03-13 LAB — PRO B NATRIURETIC PEPTIDE: NT-Pro BNP: 222 pg/mL (ref 0–738)

## 2021-03-13 NOTE — Telephone Encounter (Signed)
Urine culture report discussed with patient.  Advised to continue and finish antibiotic course as prescribed.

## 2021-03-13 NOTE — Progress Notes (Signed)
The patient has had some success with physical therapy on her right shoulder.  She has a full-thickness retracted rotator cuff tear and does ambulate with a walker.  She is morbidly obese and has significant comorbidities including diabetes and is on Eliquis.  Any type of injections always cause a significant increase in her blood glucose as well, so she is not a candidate for any type of injections.  She cannot take anti-inflammatories and cannot go without a walker due to severe arthritis in her knees.  She does feel like the therapy has helped and she has transition to home exercise program.  Her right shoulder does move more smoothly and fluidly and has some pain to be expected but she seems to be tolerating well.  She says her hardest time is actually just performing activities day living when she goes to the bathroom and she has to go regularly due to being on Lasix.  She does understand there is nothing else that we have to offer from a surgical standpoint.  All questions and concerns were answered and addressed.  Follow-up can be as needed.

## 2021-03-14 DIAGNOSIS — E1142 Type 2 diabetes mellitus with diabetic polyneuropathy: Secondary | ICD-10-CM | POA: Diagnosis not present

## 2021-03-14 DIAGNOSIS — Z794 Long term (current) use of insulin: Secondary | ICD-10-CM | POA: Diagnosis not present

## 2021-03-15 ENCOUNTER — Other Ambulatory Visit: Payer: Medicare Other

## 2021-03-16 ENCOUNTER — Other Ambulatory Visit: Payer: Self-pay | Admitting: Internal Medicine

## 2021-03-20 ENCOUNTER — Other Ambulatory Visit: Payer: Self-pay

## 2021-03-20 MED ORDER — POTASSIUM CHLORIDE ER 10 MEQ PO TBCR: 20.0000 meq | EXTENDED_RELEASE_TABLET | Freq: Every day | ORAL | 3 refills | Status: DC

## 2021-03-22 ENCOUNTER — Telehealth: Payer: Self-pay | Admitting: Internal Medicine

## 2021-03-22 ENCOUNTER — Telehealth: Payer: Self-pay | Admitting: Emergency Medicine

## 2021-03-22 NOTE — Telephone Encounter (Signed)
Pt was recently seen by Chanetta Marshall, [place] on 03/12/2021.  Spoke with pt who reports bilateral swelling of feet and ankles x 2 weeks. She states feet were swollen at her 05/24 appointment. Pt reports swelling greater on left foot and across the top of her feet.  Pt with history of varicose veins of legs with swelling and CHF.  Pt denies any CP or SOB.  She states she is taking Torsemide as prescribed and is having a large amount of urine output.  She reports swelling mostly resolves at night and she tries to elevate as much as possible during the day.  Discussed diet with pt and she states she is on a Sodium restricted diet by not adding salt to her food.  Discussed hidden salt in preserved foods.  She states she eats out twice a week at places like [employer] and places her husband likes to go other wise she cooks vegetables at home.  Encouraged pt to also work on weight loss and sodium restriction as this will help with swelling of feet as well.  Discussed purchasing and wearing compression stockings to help with blood flow in her feet and legs.  Pt states she is willing to try this.  Pt has resumed her K+ d/t hypokalemia identified with recent labwork.  Pt states she does not want additional fluid medication as she already spends so much time urinating.   Pt will work on making changes suggested by RN and will call for worsening symptoms or SOB.

## 2021-03-22 NOTE — Telephone Encounter (Signed)
Pt c/o swelling: STAT is pt has developed SOB within 24 hours  1. If swelling, where is the swelling located? feet  2. How much weight have you gained and in what time span? Not sure, has not weighed herself  3. Have you gained 3 pounds in a day or 5 pounds in a week? no  4. Do you have a log of your daily weights (if so, list)? no  5. Are you currently taking a fluid pill? yes  6. Are you currently SOB? no  7. Have you traveled recently? No    Patient states her feet are very swollen and has been for the last 3 weeks. She states she has also had a bunch of tests done, but has not heard any results yet.

## 2021-03-22 NOTE — Telephone Encounter (Signed)
Patient called and said that her feet have been swelling for a few weeks now and she said that she went to her cardiologist and they ran some tests and told her everything looked okay. She said that she takes a fluid pill. She said that she forgot the name of the medication. She said that she is wanting to know Dr. Barry Brunner opinion. Please advise

## 2021-03-25 ENCOUNTER — Telehealth: Payer: Self-pay | Admitting: Nurse Practitioner

## 2021-03-25 ENCOUNTER — Ambulatory Visit (INDEPENDENT_AMBULATORY_CARE_PROVIDER_SITE_OTHER): Payer: Medicare Other | Admitting: *Deleted

## 2021-03-25 ENCOUNTER — Telehealth: Payer: Self-pay | Admitting: Emergency Medicine

## 2021-03-25 DIAGNOSIS — E1142 Type 2 diabetes mellitus with diabetic polyneuropathy: Secondary | ICD-10-CM

## 2021-03-25 DIAGNOSIS — Z794 Long term (current) use of insulin: Secondary | ICD-10-CM

## 2021-03-25 DIAGNOSIS — I5032 Chronic diastolic (congestive) heart failure: Secondary | ICD-10-CM

## 2021-03-25 NOTE — Telephone Encounter (Signed)
Pt declines med change at this time due to already frequent urination but will call back if no change or worsening change.   Pt to try compression stockings first for improvement.

## 2021-03-25 NOTE — Telephone Encounter (Signed)
Pt c/o swelling: STAT is pt has developed SOB within 24 hours  1. If swelling, where is the swelling located? Feet and ankles- pt said she was seen 03-12-21- feet and ankles are swelling even more  2. How much weight have you gained and in what time span?   3. Have you gained 3 pounds in a day or 5 pounds in a week?   4. Do you have a log of your daily weights (if so, list)? no  5. Are you currently taking a fluid pill?  yes  6. Are you currently SOB? no  7. Have you traveled recently? no

## 2021-03-25 NOTE — Telephone Encounter (Signed)
[facility] care management- remote nurse   They have called needing an update on the psychiatric referral that was put in and wondering what facility it was for.   Also pt is having continuous swelling in the feet and needing to get a rolling walker.  Ok to leave vm- [phone]

## 2021-03-25 NOTE — Patient Instructions (Signed)
Visit Information   [name], it was nice talking with you today   Please read over the attached information, we will discuss during our future phone calls  As you know, I have asked Dr. Mitchel Honour to:  Follow up on your previously placed referral to psychiatry   Follow up on your order for a new rolling walker  Call you about getting another urinalysis since you were recently on antibiotics  I have also placed a referral for the Pharmacy team at Dr. Barry Brunner office to contact you to review your medications and your concerns with your current fluid pills- please listen out for their phone call   I look forward to talking to you again for an update on Monday April 08, 2021 at 2:15 pm please be listening out for my call that day.  I will call as close to 2:15 pm as possible; I look forward to hearing about your progress.   Please don't hesitate to contact me if I can be of assistance to you before our next scheduled appointment.   [name], RN, BSN, [facility] RN Care Coordination- [place] [phone]: direct office [phone]: mobile    PATIENT GOALS:  Goals Addressed            This Visit's Progress   . Monitor and Manage My Blood Sugar-Diabetes Type 2   On track    Timeframe:  Long-Range Goal Priority:  Medium Start Date:     03/25/21                        Expected End Date:      03/25/22                 Follow Up Date 04/08/21   . Please begin monitoring and writing down your blood sugars at home: first thing in the morning before eating and then again 2 hours after a meal: this will help your doctors know if your medications are working or if they need adjusting: we will review these values each time we talk over the phone  . Check blood sugar if I feel it is too high or too low: if you have questions about whether your continuous Free-Style Elenor Legato is accurate, continue to compare the finger-stick glucometer values-- if this is an  ongoing concern- please talk to your diabetes doctor (Dr. Kelton Pillar) about this during your next scheduled office visit on May 15, 2021 . Take your recorded blood sugars from home to all doctor visits  . Try to stay as active as possible . Try to follow a low carbohydrate low sugar diet . Please begin reading over the booklet "Living Well with Diabetes"   Why is this important?    Checking your blood sugar at home helps to keep it from getting very high or very low.   Writing the results in a diary or log helps the doctor know how to care for you.   Your blood sugar log should have the time, date and the results.   Also, write down the amount of insulin or other medicine that you take.   Other information, like what you ate, exercise done and how you were feeling, will also be helpful.         . Track and Manage Fluids and Swelling-Heart Failure   On track    Timeframe:  Long-Range Goal Priority:  Medium Start Date:   03/25/21  Expected End Date:       03/25/22                Follow Up Date 03/25/21   . Weigh myself daily and write it down on paper . Call cardiology office if I gain more than 2 pounds in one day or 5 pounds in one week . Do ankle pumps throughout the day when sitting . Keep legs up while sitting . Keep up following a "No Added Salt diet" and continue watching the food labels for salt content with the foods you eat and prepare  . Watch for swelling in feet, ankles and legs every day . Obtain the compression stockings that the cardiology staff recommended . Please listen out for the Pharmacy team from Dr. Barry Brunner office to call you-- they will help you work with your doctors to manage and optimize your medications   Why is this important?    It is important to check your weight daily and watch how much salt and liquids you have.   It will help you to manage your heart failure.           Heart Failure Action Plan A heart  failure action plan helps you understand what to do when you have symptoms of heart failure. Your action plan is a color-coded plan that lists the symptoms to watch for and indicates what actions to take.  If you have symptoms in the red zone, you need medical care right away.  If you have symptoms in the yellow zone, you are having problems.  If you have symptoms in the green zone, you are doing well. Follow the plan that was created by you and your health care provider. Review your plan each time you visit your health care provider. Red zone These signs and symptoms mean you should get medical help right away:  You have trouble breathing when resting.  You have a dry cough that is getting worse.  You have swelling or pain in your legs or abdomen that is getting worse.  You suddenly gain more than 2-3 lb (0.9-1.4 kg) in 24 hours, or more than 5 lb (2.3 kg) in a week. This amount may be more or less depending on your condition.  You have trouble staying awake or you feel confused.  You have chest pain.  You do not have an appetite.  You pass out.  You have worsening sadness or depression. If you have any of these symptoms, call your local emergency services (911 in the [place] right away. Do not drive yourself to the hospital.   Yellow zone These signs and symptoms mean your condition may be getting worse and you should make some changes:  You have trouble breathing when you are active, or you need to sleep with your head raised on extra pillows to help you breathe.  You have swelling in your legs or abdomen.  You gain 2-3 lb (0.9-1.4 kg) in 24 hours, or 5 lb (2.3 kg) in a week. This amount may be more or less depending on your condition.  You get tired easily.  You have trouble sleeping.  You have a dry cough. If you have any of these symptoms:  Contact your health care provider within the next day.  Your health care provider may adjust your medicines.   Green  zone These signs mean you are doing well and can continue what you are doing:  You do not have shortness of breath.  You  have very little swelling or no new swelling.  Your weight is stable (no gain or loss).  You have a normal activity level.  You do not have chest pain or any other new symptoms.   Follow these instructions at home:  Take over-the-counter and prescription medicines only as told by your health care provider.  Weigh yourself daily. Your target weight is __________ lb (__________ kg). ? Call your health care provider if you gain more than __________ lb (__________ kg) in 24 hours, or more than __________ lb (__________ kg) in a week. ? Health care provider name: _____________________________________________________ ? Health care provider phone number: _____________________________________________________  Eat a heart-healthy diet. Work with a diet and nutrition specialist (dietitian) to create an eating plan that is best for you.  Keep all follow-up visits. This is important. Where to find more information  American Heart Association: www.heart.org Summary  A heart failure action plan helps you understand what to do when you have symptoms of heart failure.  Follow the action plan that was created by you and your health care provider.  Get help right away if you have any symptoms in the red zone. This information is not intended to replace advice given to you by your health care provider. Make sure you discuss any questions you have with your health care provider. Document Revised: 05/21/2020 Document Reviewed: 05/21/2020 Elsevier Patient Education  2021 [place].   Critical care medicine: Principles of diagnosis and management in the adult (4th ed., pp. 6283-1517). [place]."> Miller's anesthesia (8th ed., pp. 232-250). [place]unders.">   Advance Directive  Advance directives are legal documents that allow you to make decisions about your health care and  medical treatment in case you become unable to communicate for yourself. Advance directives let your wishes be known to family, friends, and health care providers. Discussing and writing advance directives should happen over time rather than all at once. Advance directives can be changed and updated at any time. There are different types of advance directives, such as:  Medical power of attorney.  Living will.  Do not resuscitate (DNR) order or do not attempt resuscitation (DNAR) order. Health care proxy and medical power of attorney A health care proxy is also called a health care agent. This person is appointed to make medical decisions for you when you are unable to make decisions for yourself. Generally, people ask a trusted friend or family member to act as their proxy and represent their preferences. Make sure you have an agreement with your trusted person to act as your proxy. A proxy may have to make a medical decision on your behalf if your wishes are not known. A medical power of attorney, also called a durable power of attorney for health care, is a legal document that names your health care proxy. Depending on the laws in your state, the document may need to be:  Signed.  Notarized.  Dated.  Copied.  Witnessed.  Incorporated into your medical record. You may also want to appoint a trusted person to manage your money in the event you are unable to do so. This is called a durable power of attorney for finances. It is a separate legal document from the durable power of attorney for health care. You may choose your health care proxy or someone different to act as your agent in money matters. If you do not appoint a proxy, or there is a concern that the proxy is not acting in your best interest, a court  may appoint a guardian to act on your behalf. Living will A living will is a set of instructions that state your wishes about medical care when you cannot express them yourself.  Health care providers should keep a copy of your living will in your medical record. You may want to give a copy to family members or friends. To alert caregivers in case of an emergency, you can place a card in your wallet to let them know that you have a living will and where they can find it. A living will is used if you become:  Terminally ill.  Disabled.  Unable to communicate or make decisions. The following decisions should be included in your living will:  To use or not to use life support equipment, such as dialysis machines and breathing machines (ventilators).  Whether you want a DNR or DNAR order. This tells health care providers not to use cardiopulmonary resuscitation (CPR) if breathing or heartbeat stops.  To use or not to use tube feeding.  To be given or not to be given food and fluids.  Whether you want comfort (palliative) care when the goal becomes comfort rather than a cure.  Whether you want to donate your organs and tissues. A living will does not give instructions for distributing your money and property if you should pass away. DNR or DNAR A DNR or DNAR order is a request not to have CPR in the event that your heart stops beating or you stop breathing. If a DNR or DNAR order has not been made and shared, a health care provider will try to help any patient whose heart has stopped or who has stopped breathing. If you plan to have surgery, talk with your health care provider about how your DNR or DNAR order will be followed if problems occur. What if I do not have an advance directive? Some states assign family decision makers to act on your behalf if you do not have an advance directive. Each state has its own laws about advance directives. You may want to check with your health care provider, attorney, or state representative about the laws in your state. Summary  Advance directives are legal documents that allow you to make decisions about your health care and  medical treatment in case you become unable to communicate for yourself.  The process of discussing and writing advance directives should happen over time. You can change and update advance directives at any time.  Advance directives may include a medical power of attorney, a living will, and a DNR or DNAR order. This information is not intended to replace advice given to you by your health care provider. Make sure you discuss any questions you have with your health care provider. Document Revised: 07/10/2020 Document Reviewed: 07/10/2020 Elsevier Patient Education  2021 [place].   Consent to CCM Services: Ms. Montoya was given information about Chronic Care Management services today including:  1. CCM service includes personalized support from designated clinical staff supervised by her physician, including individualized plan of care and coordination with other care providers 2. 24/7 contact phone numbers for assistance for urgent and routine care needs. 3. Service will only be billed when office clinical staff spend 20 minutes or more in a month to coordinate care. 4. Only one practitioner may furnish and bill the service in a calendar month. 5. The patient may stop CCM services at any time (effective at the end of the month) by phone call to the office staff.  6. The patient will be responsible for cost sharing (co-pay) of up to 20% of the service fee (after annual deductible is met).  Patient agreed to services and verbal consent obtained.    The patient verbalized understanding of instructions, educational materials, and care plan provided today and agreed to receive a mailed copy of patient instructions, educational materials, and care plan.   Telephone follow up appointment with care management team member scheduled for:  Monday April 08, 2021 at 2:15 pm  The patient has been provided with contact information for the care management team and has been advised to call with any  health related questions or concerns.   [name], RN, BSN, [facility] RN Care Coordination- [place] [phone]: direct office [phone]: mobile    CLINICAL CARE PLAN: Patient Care Plan: Heart Failure (Adult)    Problem Identified: Symptom Exacerbation (Heart Failure)   Priority: Medium    Long-Range Goal: Symptom Exacerbation Prevented or Minimized   Start Date: 03/25/2021  Expected End Date: 03/25/2022  This Visit's Progress: On track  Priority: Medium  Note:   Current Barriers:  Marland Kitchen Knowledge deficit related to basic heart failure pathophysiology and self care management- has general baseline knowledge; will require ongoing reinforcement of same . Knowledge Deficits related to heart failure medications- needs thorough review/ optimization of medications: CCM Pharmacy referral placed . Does not adhere to provider recommendations re: daily weight monitoring/ recording: agreeable to resume this practice Case Manager Clinical Goal(s):  Over the next 12 months, patient will complete the following as evidenced by patient reporting during CCM RN CM outreach of: . verbalize understanding of Heart Failure Action Plan and when to call doctor . take all Heart Failure mediations as prescribed . monitor/ record daily weights at home (notifying MD of 3 lb weight gain over night or 5 lb in a week) Interventions:  . Collaboration with Horald Pollen, MD regarding development and update of comprehensive plan of care as evidenced by provider attestation and co-signature . Inter-disciplinary care team collaboration (see longitudinal plan of care) . Chart reviewed including relevant office notes, upcoming scheduled appointments, and lab results . Reviewed with patient recent PCP and cardiology provider office visits: patient has questions around current medications- CCM pharmacy referral placed: she reports that she was told by NP at cardiology office to  take only one verapamil (EHR indicates to take 2); reports now taking diltiazem prn- has not needed recently, wants to make sure that it is okay to take prn; diuretic/ torsemide: occasionally reduces diuretic dose when she has to leave home: reports excessive urination with currently prescribed diuretic to the point that she is unable to leave home; while on phone today, she reports she took (2) 20 mg pills as prescribed for total dose of 40 mg, at approximately 9:00 am: she reports she is on [facility] and can not get off due to constant urination; states even when she takes only one 20 mg pill she urinates 10-12 times afterward; this is clearly a problem, as it impacts her ability to perform ADL's and attend provider appointments, etc . Confirmed patient manages medications independently at home and reports overall adherence to medication regimen: does not take diuretic on days she has doctor appointments, until appointment is over and she is back at home, due to her reports of excessive urination . Discussed current  clinical condition with patient- she continues to have current clinical concerns around lower extremity swelling over last week,  several cardiac medications and possibility of ongoing UTI: patient would like to have a urinalysis done at the office to follow up on recent UTI  as she is not sure that it has resolved- wishes to avoid seeing urologist if possible; care coordination outreach placed with PCP office: request made with [facility] to make Dr. Barry Brunner triage nurse aware of same and follow up with patient . Care Coordination Outreach placed with PCP office: patient reports she has still not heard from psychiatry referral placed on both 02/28/21 and 03/11/21: from review of EHR, I am unable to determine where this referral was made: requested clarification/ follow up from Dr. Barry Brunner triage nurse; patient also reports has still not heard anything from DME (rolling walker) ordered 02/28/21: requested  Dr. Barry Brunner nurse to please contact patient in follow up: she reports the current standard walker that she has borrowed from a friend is not the right size, and is causing significant episodes of shoulder pain and is also "tearing up" her floor . Assessed patient's current understanding of pathophysiology of Heart Failure- reinforcement and verbal education provided- patient will require ongoing reinforcement of same . Assessed need for readable accurate scales in home: patient has scales but has not been consistently monitoring/ recording daily weights at home because she previously had consistent weights without lower extremity swelling;  provided education and importance/ rationale for daily weight monitoring/ recording at home- she is agreeable to resume this moving forward . Reviewed last weight at Dr. Barry Brunner office during office visit 03/11/21: 251 lbs; reports did weigh self this morning: 253 lbs; reinforced importance/ rationale for doing so and encouraged patient to begin daily weight monitoring/ recording at the same time each day upon waking . Provided education around signs/ symptoms yellow CHF zone/ weight gain guidelines in setting of CHF along with corresponding action plan; today she denies additional signs/ symptoms such as shortness of breath, chest/ abdominal tightness, cough; reviewed Heart Failure Action Plan/ weight gain guidelines in depth and provided written copy . Confirmed patient endorses following low salt, heart healthy diet . Reviewed upcoming provider appointments with patient and confirmed that patient has plans to attend all as scheduled: podiatry 03/29/21; pulmonary 04/05/21; 04/08/21 colonoscopy screening; 05/15/21 endocrinology Self-Care Activities: . Patient verbalizes understanding of plan to resume daily weight monitoring and recording . Self administers medications as prescribed-- occasionally reduces diuretic dose when she has to leave home: reports excessive  urination with currently prescribed diuretic to the point that she is unable to leave home . Attends all scheduled provider appointments . Calls pharmacy for medication refills . Attends church or other social activities . Performs ADL's/ iADL's independently . Calls provider office for new concerns or questions Patient Goals: . Weigh myself daily and write it down on paper . Call cardiology office if I gain more than 2 pounds in one day or 5 pounds in one week . Do ankle pumps throughout the day when sitting . Keep legs up while sitting . Keep up following a "No Added Salt diet" and continue watching the food labels for salt content with the foods you eat and prepare  . Watch for swelling in feet, ankles and legs every day . Obtain the compression stockings that the cardiology staff recommended . Please listen out for the Pharmacy team from Dr. Barry Brunner office to call you-- they will help you work with your doctors to manage and optimize your medications  Follow Up Plan:  . Telephone follow up appointment with care management team  member scheduled for: Monday April 08, 2021 at 2:15 pm . The patient has been provided with contact information for the care management team and has been advised to call with any health related questions or concerns.     Patient Care Plan: Diabetes Type 2 (Adult)    Problem Identified: Glycemic Management (Diabetes, Type 2)   Priority: Medium    Long-Range Goal: Glycemic Management Optimized   Start Date: 03/25/2021  Expected End Date: 03/25/2022  This Visit's Progress: On track  Priority: Medium  Note:   Objective:  Lab Results  Component Value Date   HGBA1C 7.4 (A) 11/29/2020 .   Lab Results  Component Value Date   CREATININE 0.74 03/12/2021   CREATININE 0.80 01/23/2021   CREATININE 0.72 01/03/2021 .   Lab Results  Component Value Date   EGFR 83 03/12/2021 .   Current Barriers:  Marland Kitchen Knowledge Deficits related to basic Diabetes pathophysiology and  self care/management- has fair baseline understanding, could benefit from ongoing reinforcement of same . Does not know how to utilize history function of CGM- will require ongoing reinforcement of same  Case Manager Clinical Goal(s):  Over the next 12 months, patient will demonstrate improved adherence to prescribed treatment plan for diabetes self care/management as evidenced by patient reporting during CCM RN CM outreach of:   . daily monitoring and recording of blood sugars at home using CGM and glucometer, as indicated   . adherence to ADA/ carb modified/ low sugar diet  . adherence to prescribed medication regimen Interventions:  . Collaboration with Horald Pollen, MD regarding development and update of comprehensive plan of care as evidenced by provider attestation and co-signature . Inter-disciplinary care team collaboration (see longitudinal plan of care) . Review of patient status, including review of consultants reports, relevant laboratory and other test results, and medications completed . Chart reviewed including relevant office notes, upcoming scheduled appointments, and lab results: last A1-C noted: 7.4 (11/29/20) . Assessed patient's current knowledge about basic DM disease process; patient has fair general understanding of same- will require ongoing reinforcement/ provision of education . Reviewed medications for DM with patient and and confirmed ongoing adherence to DM medications . Advised patient, providing education and rationale, to continue monitoring/ recording blood sugars at home using FSL-CGM, and glucometer: she reports today several occasions where FSL indicated blood sugar results that were significantly inconsistent with glucometer  . Confirmed patient has good baseline understanding of importance of regular foot care-- is awaiting to obtain diabetic shoes scheduled 03/29/21, but is fearful this will not happen due to ongoing recent bilateral foot  swelling . Discussed plans with patient for ongoing care management follow up and provided patient with direct contact information for care management team. Self-Care Activities . UNABLE to independently check historical values using CGM/ FSL: will require ongoing reinforcement . Self administers insulin and injectable DM medicationas (Ozempic) prescribed . Attends all scheduled provider appointments . Checks blood sugars as prescribed and compares CGM to glucometer values when indicated . Attempts to adhere to prescribed ADA/carb modified/ low sugar diet: could benefit from ongoing reinforcement of dietary management of DM Patient Goals: . Please begin monitoring and writing down your blood sugars at home: first thing in the morning before eating and then again 2 hours after a meal: this will help your doctors know if your medications are working or if they need adjusting: we will review these values each time we talk over the phone  . Check blood sugar if I feel  it is too high or too low: if you have questions about whether your continuous Free-Style Elenor Legato is accurate, continue to compare the finger-stick glucometer values-- if this is an ongoing concern- please talk to your diabetes doctor (Dr. Kelton Pillar) about this during your next scheduled office visit on May 15, 2021 . Take your recorded blood sugars from home to all doctor visits  . Try to stay as active as possible . Try to follow a low carbohydrate low sugar diet . Please begin reading over the booklet "Living Well with Diabetes" Follow Up Plan:  . Telephone follow up appointment with care management team member scheduled for: Monday April 08, 2021 at 2:15 pm . The patient has been provided with contact information for the care management team and has been advised to call with any health related questions or concerns.      [name], RN, BSN, [facility] RN Care Coordination- [place] [phone]:  direct office [phone]: mobile

## 2021-03-25 NOTE — Chronic Care Management (AMB) (Signed)
Chronic Care Management   CCM RN Visit Note  03/25/2021 Name: [name] MRN: [id number] DOB: 1941-01-27  Subjective: [name] is a 80 y.o. year old female who is a primary care patient of Sagardia, Ines Bloomer, MD. The care management team was consulted for assistance with disease management and care coordination needs.    Engaged with patient by telephone for initial visit in response to provider referral for case management and/or care coordination services.   Consent to Services:  The patient was given information about Chronic Care Management services, agreed to services, and gave verbal consent 03/11/21 prior to initiation of services.  Please see initial visit note for detailed documentation.  Patient agreed to services and verbal consent obtained.   Assessment: Review of patient past medical history, allergies, medications, health status, including review of consultants reports, laboratory and other test data, was performed as part of comprehensive evaluation and provision of chronic care management services.   SDOH (Social Determinants of Health) assessments and interventions performed:  SDOH Interventions   Flowsheet Row Most Recent Value  SDOH Interventions   Food Insecurity Interventions Intervention Not Indicated  Housing Interventions Intervention Not Indicated  Transportation Interventions Intervention Not Indicated      CCM Care Plan  Allergies  Allergen Reactions  . Metformin Diarrhea    Outpatient Encounter Medications as of 03/25/2021  Medication Sig Note  . diltiazem (CARDIZEM) 30 MG tablet Take 30 mg by mouth. Take one tablet every 4 hours as needed for heart rate greater than 100 and blood pressure needs to be above 100 03/25/2021: 03/25/21: Reports used last "about 3 weeks ago"- reports took one pill only   . Accu-Chek Softclix Lancets lancets 1 each by Other route 3 (three) times daily. as directed   . allopurinol (ZYLOPRIM) 300 MG tablet TAKE 1  TABLET(300 MG) BY MOUTH DAILY   . amiodarone (PACERONE) 200 MG tablet Take 1 tablet (200 mg total) by mouth daily.   . blood glucose meter kit and supplies Dispense based on patient and insurance preference. Use up to four times daily as directed. (FOR ICD-10 E10.9, E11.9). 01/23/2021: use  . cloNIDine (CATAPRES) 0.3 MG tablet TAKE 1 TABLET(0.3 MG) BY MOUTH TWICE DAILY   . Continuous Blood Gluc Receiver (FREESTYLE LIBRE 14 DAY READER) DEVI Use as directed. 01/23/2021: use  . Continuous Blood Gluc Sensor (FREESTYLE LIBRE 14 DAY SENSOR) MISC Use as directed to check blood sugar daily 01/23/2021: use  . ELIQUIS 5 MG TABS tablet TAKE 1 TABLET(5 MG) BY MOUTH TWICE DAILY   . Insulin Lispro Prot & Lispro (HUMALOG MIX 75/25 KWIKPEN) (75-25) 100 UNIT/ML Kwikpen Inject 15 Units into the skin 2 (two) times daily.   Marland Kitchen levothyroxine (SYNTHROID) 75 MCG tablet Take 1 tablet (75 mcg total) by mouth daily before breakfast.   . lidocaine (LIDODERM) 5 % Place 1 patch onto the skin daily. Remove & Discard patch within 12 hours or as directed by MD 01/23/2021: prn  . loperamide (IMODIUM) 2 MG capsule Take 1 capsule (2 mg total) by mouth 4 (four) times daily as needed for diarrhea or loose stools.   Marland Kitchen losartan (COZAAR) 100 MG tablet Take 1 tablet (100 mg total) by mouth daily.   . Na Sulfate-K Sulfate-Mg Sulf (SUPREP BOWEL PREP KIT) 17.5-3.13-1.6 GM/177ML SOLN Take 1 kit by mouth as directed.   Marland Kitchen NOVOFINE PLUS 32G X 4 MM MISC 1 each by Subdermal route 2 (two) times daily. as directed 01/23/2021: use  . ONETOUCH VERIO test  strip 1 each 3 (three) times daily. 01/23/2021: use  . potassium chloride (KLOR-CON) 10 MEQ tablet Take 2 tablets (20 mEq total) by mouth daily.   . RESTASIS MULTIDOSE 0.05 % ophthalmic emulsion Place 1 drop into both eyes 2 (two) times daily.    . rosuvastatin (CRESTOR) 10 MG tablet Take 10 mg by mouth every evening.   . Semaglutide,0.25 or 0.5MG/DOS, (OZEMPIC, 0.25 OR 0.5 MG/DOSE,) 2 MG/1.5ML SOPN Inject 0.75  mLs (1 mg total) into the skin once a week.   Marland Kitchen tiZANidine (ZANAFLEX) 4 MG tablet Take 1 tablet (4 mg total) by mouth every 8 (eight) hours as needed for muscle spasms.   Marland Kitchen torsemide (DEMADEX) 20 MG tablet TAKE 2 TABLETS(40 MG) BY MOUTH DAILY   . triamcinolone cream (KENALOG) 0.1 % Apply 1 application topically 2 (two) times daily.   . verapamil (CALAN) 120 MG tablet Take 1 tablet (120 mg total) by mouth 2 (two) times daily. 03/25/2021: 03/25/21: Pt reports she was told by cardiology NP 03/12/21 to reduce dose to QD: note reviewed; NP documented to take "prn" Diltiazem: CCM pharmacy referral requested for review/ clarification/ message sent to cards NP    No facility-administered encounter medications on file as of 03/25/2021.    Patient Active Problem List   Diagnosis Date Noted  . Dysthymia 03/12/2021  . Dysuria 03/01/2021  . Primary osteoarthritis involving multiple joints 03/01/2021  . Recurrent falls 01/10/2021  . Sensorineural hearing loss (SNHL), 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  . Persistent atrial fibrillation ([place]) 08/12/2020  . Type 2 diabetes mellitus with hyperglycemia, with long-term current use of insulin ([place]) 06/21/2020  . Type 2 diabetes mellitus with diabetic polyneuropathy, with long-term current use of insulin ([place]) 06/21/2020  . Mixed hyperlipidemia 06/21/2020  . Uncontrolled hypertension 06/11/2020  . Aortic atherosclerosis ([place]) 03/28/2020  . Diabetic peripheral neuropathy ([place]) 12/26/2019  . Intertrigo 10/23/2019  . Gastroesophageal reflux disease without esophagitis 03/17/2019  . Rash 03/17/2019  . Bilateral leg edema 02/16/2019  . Pruritus 02/16/2019  . Varicose veins of both legs with edema 02/16/2019  . Heart murmur 09/05/2018  . Restrictive lung disease 02/22/2018  . Dysgeusia 06/11/2017  . Allergic dermatitis 06/03/2017  . Osteoarthritis of left knee 06/03/2017  . NAFLD (nonalcoholic fatty liver disease) 08/07/2016  . Osteoporosis 12/31/2015  . Obesity 12/19/2015  .  Hypersomnia 12/19/2015  . Edema 10/18/2015  . Acquired hypothyroidism 10/18/2015  . Acute diastolic congestive heart failure, NYHA class 3 ([place]) 10/03/2013  . Prolonged QT interval 10/03/2013  . Acute diastolic heart failure ([place]) 10/03/2013  . Long term (current) use of anticoagulants 06/09/2012  . BENIGN NEOPLASM OF ADRENAL GLAND 11/25/2010  . Chronic diastolic heart failure ([place]) 02/20/2010  . DM 05/16/2009  . GOUT 05/16/2009  . OBESITY, MORBID 05/16/2009  . Hypertension associated with diabetes ([place]) 05/16/2009  . Paroxysmal atrial fibrillation ([place]) 05/16/2009  . HYPERLIPIDEMIA 11/30/2008  . Obstructive sleep apnea on CPAP 11/30/2008    Conditions to be addressed/monitored:CHF and DMII  Care Plan : Heart Failure (Adult)  Updates made by Knox Royalty, RN since 03/25/2021 12:00 AM    Problem: Symptom Exacerbation (Heart Failure)   Priority: Medium    Long-Range Goal: Symptom Exacerbation Prevented or Minimized   Start Date: 03/25/2021  Expected End Date: 03/25/2022  This Visit's Progress: On track  Priority: Medium  Note:   Current Barriers:  Marland Kitchen Knowledge deficit related to basic heart failure pathophysiology and self care management- has general baseline knowledge; will require ongoing reinforcement of same .  Knowledge Deficits related to heart failure medications- needs thorough review/ optimization of medications: CCM Pharmacy referral placed . Does not adhere to provider recommendations re: daily weight monitoring/ recording: agreeable to resume this practice Case Manager Clinical Goal(s):  Over the next 12 months, patient will complete the following as evidenced by patient reporting during CCM RN CM outreach of: . verbalize understanding of Heart Failure Action Plan and when to call doctor . take all Heart Failure mediations as prescribed . monitor/ record daily weights at home (notifying MD of 3 lb weight gain over night or 5 lb in a week) Interventions:   . Collaboration with Horald Pollen, MD regarding development and update of comprehensive plan of care as evidenced by provider attestation and co-signature . Inter-disciplinary care team collaboration (see longitudinal plan of care) . Chart reviewed including relevant office notes, upcoming scheduled appointments, and lab results . Reviewed with patient recent PCP and cardiology provider office visits: patient has questions around current medications- CCM pharmacy referral placed: she reports that she was told by NP at cardiology office to take only one verapamil (EHR indicates to take 2); reports now taking diltiazem prn- has not needed recently, wants to make sure that it is okay to take prn; diuretic/ torsemide: occasionally reduces diuretic dose when she has to leave home: reports excessive urination with currently prescribed diuretic to the point that she is unable to leave home; while on phone today, she reports she took (2) 20 mg pills as prescribed for total dose of 40 mg, at approximately 9:00 am: she reports she is on [facility] and can not get off due to constant urination; states even when she takes only one 20 mg pill she urinates 10-12 times afterward; this is clearly a problem, as it impacts her ability to perform ADL's and attend provider appointments, etc . Confirmed patient manages medications independently at home and reports overall adherence to medication regimen: does not take diuretic on days she has doctor appointments, until appointment is over and she is back at home, due to her reports of excessive urination . Discussed current  clinical condition with patient- she continues to have current clinical concerns around lower extremity swelling over last week, several cardiac medications and possibility of ongoing UTI: patient would like to have a urinalysis done at the office to follow up on recent UTI  as she is not sure that it has resolved- wishes to avoid seeing urologist if  possible; care coordination outreach placed with PCP office: request made with [facility] to make Dr. Barry Brunner triage nurse aware of same and follow up with patient . Care Coordination Outreach placed with PCP office: patient reports she has still not heard from psychiatry referral placed on both 02/28/21 and 03/11/21: from review of EHR, I am unable to determine where this referral was made: requested clarification/ follow up from Dr. Barry Brunner triage nurse; patient also reports has still not heard anything from DME (rolling walker) ordered 02/28/21: requested Dr. Barry Brunner nurse to please contact patient in follow up: she reports the current standard walker that she has borrowed from a friend is not the right size, and is causing significant episodes of shoulder pain and is also "tearing up" her floor . Assessed patient's current understanding of pathophysiology of Heart Failure- reinforcement and verbal education provided- patient will require ongoing reinforcement of same . Assessed need for readable accurate scales in home: patient has scales but has not been consistently monitoring/ recording daily weights at home because she previously  had consistent weights without lower extremity swelling;  provided education and importance/ rationale for daily weight monitoring/ recording at home- she is agreeable to resume this moving forward . Reviewed last weight at Dr. Barry Brunner office during office visit 03/11/21: 251 lbs; reports did weigh self this morning: 253 lbs; reinforced importance/ rationale for doing so and encouraged patient to begin daily weight monitoring/ recording at the same time each day upon waking . Provided education around signs/ symptoms yellow CHF zone/ weight gain guidelines in setting of CHF along with corresponding action plan; today she denies additional signs/ symptoms such as shortness of breath, chest/ abdominal tightness, cough; reviewed Heart Failure Action Plan/ weight gain guidelines  in depth and provided written copy . Confirmed patient endorses following low salt, heart healthy diet . Reviewed upcoming provider appointments with patient and confirmed that patient has plans to attend all as scheduled: podiatry 03/29/21; pulmonary 04/05/21; 04/08/21 colonoscopy screening; 05/15/21 endocrinology Self-Care Activities: . Patient verbalizes understanding of plan to resume daily weight monitoring and recording . Self administers medications as prescribed-- occasionally reduces diuretic dose when she has to leave home: reports excessive urination with currently prescribed diuretic to the point that she is unable to leave home . Attends all scheduled provider appointments . Calls pharmacy for medication refills . Attends church or other social activities . Performs ADL's/ iADL's independently . Calls provider office for new concerns or questions Patient Goals: . Weigh myself daily and write it down on paper . Call cardiology office if I gain more than 2 pounds in one day or 5 pounds in one week . Do ankle pumps throughout the day when sitting . Keep legs up while sitting . Keep up following a "No Added Salt diet" and continue watching the food labels for salt content with the foods you eat and prepare  . Watch for swelling in feet, ankles and legs every day . Obtain the compression stockings that the cardiology staff recommended . Please listen out for the Pharmacy team from Dr. Barry Brunner office to call you-- they will help you work with your doctors to manage and optimize your medications  Follow Up Plan:  . Telephone follow up appointment with care management team member scheduled for: Monday April 08, 2021 at 2:15 pm . The patient has been provided with contact information for the care management team and has been advised to call with any health related questions or concerns.     Care Plan : Diabetes Type 2 (Adult)  Updates made by Knox Royalty, RN since 03/25/2021 12:00 AM     Problem: Glycemic Management (Diabetes, Type 2)   Priority: Medium    Long-Range Goal: Glycemic Management Optimized   Start Date: 03/25/2021  Expected End Date: 03/25/2022  This Visit's Progress: On track  Priority: Medium  Note:   Objective:  Lab Results  Component Value Date   HGBA1C 7.4 (A) 11/29/2020 .   Lab Results  Component Value Date   CREATININE 0.74 03/12/2021   CREATININE 0.80 01/23/2021   CREATININE 0.72 01/03/2021 .   Lab Results  Component Value Date   EGFR 83 03/12/2021 .   Current Barriers:  Marland Kitchen Knowledge Deficits related to basic Diabetes pathophysiology and self care/management- has fair baseline understanding, could benefit from ongoing reinforcement of same . Does not know how to utilize history function of CGM- will require ongoing reinforcement of same  Case Manager Clinical Goal(s):  Over the next 12 months, patient will demonstrate improved adherence to prescribed treatment  plan for diabetes self care/management as evidenced by patient reporting during CCM RN CM outreach of:   . daily monitoring and recording of blood sugars at home using CGM and glucometer, as indicated   . adherence to ADA/ carb modified/ low sugar diet  . adherence to prescribed medication regimen Interventions:  . Collaboration with Horald Pollen, MD regarding development and update of comprehensive plan of care as evidenced by provider attestation and co-signature . Inter-disciplinary care team collaboration (see longitudinal plan of care) . Review of patient status, including review of consultants reports, relevant laboratory and other test results, and medications completed . Chart reviewed including relevant office notes, upcoming scheduled appointments, and lab results: last A1-C noted: 7.4 (11/29/20) . Assessed patient's current knowledge about basic DM disease process; patient has fair general understanding of same- will require ongoing reinforcement/ provision of  education . Reviewed medications for DM with patient and and confirmed ongoing adherence to DM medications . Advised patient, providing education and rationale, to continue monitoring/ recording blood sugars at home using FSL-CGM, and glucometer: she reports today several occasions where FSL indicated blood sugar results that were significantly inconsistent with glucometer  . Confirmed patient has good baseline understanding of importance of regular foot care-- is awaiting to obtain diabetic shoes scheduled 03/29/21, but is fearful this will not happen due to ongoing recent bilateral foot swelling . Discussed plans with patient for ongoing care management follow up and provided patient with direct contact information for care management team. Self-Care Activities . UNABLE to independently check historical values using CGM/ FSL: will require ongoing reinforcement . Self administers insulin and injectable DM medicationas (Ozempic) prescribed . Attends all scheduled provider appointments . Checks blood sugars as prescribed and compares CGM to glucometer values when indicated . Attempts to adhere to prescribed ADA/carb modified/ low sugar diet: could benefit from ongoing reinforcement of dietary management of DM Patient Goals: . Please begin monitoring and writing down your blood sugars at home: first thing in the morning before eating and then again 2 hours after a meal: this will help your doctors know if your medications are working or if they need adjusting: we will review these values each time we talk over the phone  . Check blood sugar if I feel it is too high or too low: if you have questions about whether your continuous Free-Style Elenor Legato is accurate, continue to compare the finger-stick glucometer values-- if this is an ongoing concern- please talk to your diabetes doctor (Dr. Kelton Pillar) about this during your next scheduled office visit on May 15, 2021 . Take your recorded blood sugars from home  to all doctor visits  . Try to stay as active as possible . Try to follow a low carbohydrate low sugar diet . Please begin reading over the booklet "Living Well with Diabetes" Follow Up Plan:  . Telephone follow up appointment with care management team member scheduled for: Monday April 08, 2021 at 2:15 pm . The patient has been provided with contact information for the care management team and has been advised to call with any health related questions or concerns.      Plan:  Telephone follow up appointment with care management team member scheduled for: Monday April 08, 2021 at 2:15 pm  The patient has been provided with contact information for the care management team and has been advised to call with any health related questions or concerns.    [name], RN, BSN, Occupational psychologist  Care Coordination- [place] [phone]: direct office [phone]: mobile

## 2021-03-25 NOTE — Telephone Encounter (Signed)
Pt called to report that she is still  having bilateral ankle edema since she spoke with another nurse this past Friday 6/3 and she was given recommendations then to cut back on her NA intake and elevate her feet... she had at that time declined to taking any added diuretics since she says she urinates heavily during the day and night.. she taker her Torsemide 40 mg every morning but she takes it later in the day if she has appts or somewhere to go during the day so she is not having to use the rest room so much while she is out.   She says she has mild SOB with exertion. Yesterday she was unable to wear her regular shoes to church.   Pt says she elevates her feet as much as possible, she does not have added NA, but she has not tried the compression stockings which I have urged her to try and get some as soon as she can at the local pharmacy.   She will call back if no improvement and I advised her then that she may need to take extra fluid pills but I will to talk with her MD prior to that change. She says she will let me know but prefers to wait.   Will forward to the MD for review and any further recommendations.

## 2021-03-26 ENCOUNTER — Telehealth: Payer: Self-pay | Admitting: Emergency Medicine

## 2021-03-26 NOTE — Telephone Encounter (Signed)
Edema of the lower extremity can be a number of things.  Hard to diagnose over the phone.  I believe she takes Demadex, water pill. She was just seen by the cardiologist.  Recommend leg elevation, low-salt diet, and continued diuretic.  Thanks.

## 2021-03-26 NOTE — Progress Notes (Signed)
  Chronic Care Management   Outreach Note  03/26/2021 Name: [name] MRN: [id number] DOB: 1941-08-23  Referred by: Horald Pollen, MD Reason for referral : No chief complaint on file.   An unsuccessful telephone outreach was attempted today. The patient was referred to the pharmacist for assistance with care management and care coordination.   Follow Up Plan:   Lauretta Grill Upstream Scheduler

## 2021-03-27 ENCOUNTER — Telehealth: Payer: Self-pay | Admitting: Emergency Medicine

## 2021-03-27 ENCOUNTER — Other Ambulatory Visit: Payer: Self-pay | Admitting: Emergency Medicine

## 2021-03-27 DIAGNOSIS — R300 Dysuria: Secondary | ICD-10-CM

## 2021-03-27 NOTE — Chronic Care Management (AMB) (Signed)
Initial CCM office visit scheduled.  Lauretta Grill Upstream Scheduler

## 2021-03-27 NOTE — Telephone Encounter (Signed)
She can bring a urine sample to the lab anytime she wants.  Future order was placed today.  Thanks.

## 2021-03-27 NOTE — Telephone Encounter (Signed)
Called and spoke with pt about swelling in her feet. Pt states she will call the cardiologist for further evaluation. Pt would like to know if a urinalysis could be ordered to make sure pervious UTI is gone. She states that last week she was having some pressure after urination.

## 2021-03-28 ENCOUNTER — Other Ambulatory Visit (INDEPENDENT_AMBULATORY_CARE_PROVIDER_SITE_OTHER): Payer: Medicare Other

## 2021-03-28 DIAGNOSIS — R300 Dysuria: Secondary | ICD-10-CM | POA: Diagnosis not present

## 2021-03-28 LAB — URINALYSIS
Bilirubin Urine: NEGATIVE
Hgb urine dipstick: NEGATIVE
Ketones, ur: NEGATIVE
Leukocytes,Ua: NEGATIVE
Nitrite: NEGATIVE
Specific Gravity, Urine: 1.02 (ref 1.000–1.030)
Total Protein, Urine: NEGATIVE
Urine Glucose: NEGATIVE
Urobilinogen, UA: 0.2 (ref 0.0–1.0)
pH: 5.5 (ref 5.0–8.0)

## 2021-03-28 NOTE — Telephone Encounter (Signed)
Called and spoke with pt about her coming to provide an urine sample, she verbalized understanding.

## 2021-03-29 ENCOUNTER — Other Ambulatory Visit: Payer: Medicare Other

## 2021-03-29 ENCOUNTER — Ambulatory Visit (INDEPENDENT_AMBULATORY_CARE_PROVIDER_SITE_OTHER): Payer: Medicare Other | Admitting: Podiatry

## 2021-03-29 ENCOUNTER — Other Ambulatory Visit: Payer: Self-pay

## 2021-03-29 ENCOUNTER — Encounter: Payer: Self-pay | Admitting: Podiatry

## 2021-03-29 ENCOUNTER — Other Ambulatory Visit: Payer: Self-pay | Admitting: Emergency Medicine

## 2021-03-29 DIAGNOSIS — E1159 Type 2 diabetes mellitus with other circulatory complications: Secondary | ICD-10-CM

## 2021-03-29 DIAGNOSIS — B351 Tinea unguium: Secondary | ICD-10-CM | POA: Diagnosis not present

## 2021-03-29 DIAGNOSIS — I152 Hypertension secondary to endocrine disorders: Secondary | ICD-10-CM

## 2021-03-29 DIAGNOSIS — M79674 Pain in right toe(s): Secondary | ICD-10-CM | POA: Diagnosis not present

## 2021-03-29 DIAGNOSIS — M79675 Pain in left toe(s): Secondary | ICD-10-CM

## 2021-03-29 DIAGNOSIS — M2011 Hallux valgus (acquired), right foot: Secondary | ICD-10-CM | POA: Diagnosis not present

## 2021-03-29 DIAGNOSIS — M2012 Hallux valgus (acquired), left foot: Secondary | ICD-10-CM | POA: Diagnosis not present

## 2021-03-29 DIAGNOSIS — M2041 Other hammer toe(s) (acquired), right foot: Secondary | ICD-10-CM | POA: Diagnosis not present

## 2021-03-29 DIAGNOSIS — M2042 Other hammer toe(s) (acquired), left foot: Secondary | ICD-10-CM | POA: Diagnosis not present

## 2021-03-29 DIAGNOSIS — E1142 Type 2 diabetes mellitus with diabetic polyneuropathy: Secondary | ICD-10-CM | POA: Diagnosis not present

## 2021-03-29 LAB — URINE CULTURE

## 2021-04-01 ENCOUNTER — Telehealth: Payer: Self-pay | Admitting: *Deleted

## 2021-04-01 NOTE — Telephone Encounter (Signed)
Spoke to patient concerning referral to psychiatry to let her know referral tech is checking on it. Also, DME order for wheeled rolling walker with seat (bariatric), patient is waiting on [facility] appeals process. I advised her to contact [facility] to check status.

## 2021-04-01 NOTE — Telephone Encounter (Signed)
Spoke to patient about DME dated 02/28/2021 for bariatric wheeled walker with seat. Patient is waiting on [facility] appeals process, I advised patient on contact [employer] to check status.

## 2021-04-03 NOTE — Progress Notes (Signed)
  Subjective:  Patient ID: [name], female    DOB: 12-26-1940,  MRN: [id number]  80 y.o. female presents at risk foot care with history of diabetic neuropathy and painful thick toenails that are difficult to trim. Pain interferes with ambulation. Aggravating factors include wearing enclosed shoe gear. Pain is relieved with periodic professional debridement.  Patient states her blood glucose was 137 mg/dl today.  She states she lost her brother two weeks ago.  She notes no new pedal concerns on today's visit.  Allergies  Allergen Reactions   Metformin Diarrhea    Review of Systems: Negative except as noted in the HPI.   Objective:   Constitutional Pt is a pleasant 80 y.o. Hispanic female morbidly obese in NAD. AAO x 3.   Vascular Capillary refill time to digits immediate b/l. Palpable pedal pulses b/l LE. Pedal hair present. Lower extremity skin temperature gradient within normal limits.  Neurologic Protective sensation intact 5/5 intact bilaterally with 10g monofilament b/l. Vibratory sensation intact b/l.  Dermatologic Pedal skin with normal turgor, texture and tone bilaterally. No open wounds bilaterally. No interdigital macerations bilaterally. Toenails 1-5 b/l elongated, discolored, dystrophic, thickened, crumbly with subungual debris and tenderness to dorsal palpation.  Orthopedic: Normal muscle strength 5/5 to all lower extremity muscle groups bilaterally. No pain crepitus or joint limitation noted with ROM b/l. Hallux valgus with bunion deformity noted b/l lower extremities. Hammertoe(s) noted to the 2-5 bilaterally.   Radiographs: None Assessment:   1. Pain due to onychomycosis of toenails of both feet     Plan:  Patient was evaluated and treated and all questions answered.  Onychomycosis with pain -Nails palliatively debridement as below. -Educated on self-care  Procedure: Nail Debridement Rationale: Pain Type of Debridement: manual, sharp  debridement. Instrumentation: Nail nipper, rotary burr. Number of Nails: 10  -Examined patient. -Continue diabetic foot care principles. -Patient to continue soft, supportive shoe gear daily. -Toenails 1-5 b/l were debrided in length and girth with sterile nail nippers and dremel without iatrogenic bleeding.  -Patient to report any pedal injuries to medical professional immediately. -Patient/POA to call should there be question/concern in the interim.  Return in about 3 months (around 06/29/2021).  Marzetta Board, DPM

## 2021-04-04 ENCOUNTER — Telehealth: Payer: Self-pay | Admitting: Internal Medicine

## 2021-04-04 NOTE — Progress Notes (Signed)
01/03/21- [name] never smoker for sleep evaluation courtesy of Dr Joellen Jersey Medical problem list includes dCHF, Aortic Atherosclerosis, HTN, PAFib/Amiodarone/Eliquis, Varicose Veins, Restrictive Lung Disease, GERD, Nonalcoholic Fatty Liver, Hypothyroid, DM2/ neuropathy, GOUT, Morbid Obesity,  NPSG 06/05/11-AHI 20.1/ hr, desaturation to  87%, body weight 261 lbs, CPAP 10 CPAP 5-15/ [employer] Download-   Compliance 30%, AHI 0.4/ hr                     Machine replaced 2017 Epworth score-6 Body weight today-253 lbs Covid vax-none Flu vax--none -----Formerly seen by Dr. Corrie Dandy in 2018 for OSA. Pt's spouse says that she is snoring over cpap Denies daytime sleepiness.Denies significant parasomnias. Seen in past by [facility] after hosp in [place] for CHF 4 years ago. No hx DVT/PE. Now cardiology follows. No home O2.  ENT surgery+tonsils. She is using and benefiting from CPAP. Reviewed download and goals. PFT 03/25/17- moderately severe obstruction, insignificant response to BD, Nl TLC, DLCO mildly reduced. She would like [place] to be her pulmonary care group as well as sleep.  Denies cough/ wheeze. Easy DOE- chronic.  04/05/21- [name] never smoker followed for OSA, Asthma/ COPD, complicated by dCHF, Aortic Atherosclerosis, HTN, PAFib/Amiodarone/Eliquis, Varicose Veins, Restrictive Lung Disease, GERD, Nonalcoholic Fatty Liver, Hypothyroid, DM2/ neuropathy, GOUT, Morbid Obesity,  CPAP auto 5-15/ [employer]    replaced 2017   replacement ordered 01/03/21 Download- compliance 27%(short nights), AHI 0.4/ hr Body weight today-253 lbs Covid vax- Wants [place] to manage pulmonary and sleep. Has no bronchodilators. Denies any problems with her breathing- no cough or wheeze and no need for inhalers. Pending local move, which she expects to complete in "maybe a month". Bed packed up. Sleeping in recliner. Frequent nocturia- often leaves CPAP off as she gets back in bed. Supply chain delay- still awaiting replacement CPAP  machine ordered in March. Comfortable with nasal pillow mask.  CXR 01/05/21- FINDINGS: Stable cardiomegaly. The hila and mediastinum are unchanged. No pneumothorax. No nodules or masses. No focal infiltrates. No overt pulmonary edema identified. IMPRESSION: Cardiomegaly.  No overt edema on today's study.   ROS-see HPI   + = positive Constitutional:    weight loss, night sweats, fevers, chills, fatigue, lassitude. HEENT:    headaches, difficulty swallowing, tooth/dental problems, sore throat,       sneezing, itching, ear ache, nasal congestion, post nasal drip, snoring CV:    chest pain, orthopnea, PND, swelling in lower extremities, anasarca,                                   dizziness, palpitations Resp:   +shortness of breath with exertion or at rest.                productive cough,   non-productive cough, coughing up of blood.              change in color of mucus.  wheezing.   Skin:    rash or lesions. GI:  No-   heartburn, indigestion, abdominal pain, nausea, vomiting, diarrhea,                 change in bowel habits, loss of appetite GU: dysuria, change in color of urine, +urgency or frequency.   flank pain. MS:   joint pain, stiffness, decreased range of motion, back pain. Neuro-     nothing unusual Psych:  change in mood or affect.  depression  or anxiety.   memory loss.  OBJ- Physical Exam General- Alert, Oriented, Affect-appropriate, Distress- none acute, + morbidly obese, + rolling walker Skin- rash-none, lesions- none, excoriation- none Lymphadenopathy- none Head- atraumatic            Eyes- Gross vision intact, PERRLA, conjunctivae and secretions clear,             Ears- Hearing, canals-normal            Nose- Clear, no-Septal dev, mucus, polyps, erosion, perforation             Throat- Mallampati II-III , mucosa clear , drainage- none, tonsils- absent, + teeth Neck- flexible , trachea midline, no stridor , thyroid nl, carotid no bruit Chest - symmetrical excursion ,  unlabored           Heart/CV- RRR , + murmur 1-2+S , no gallop  , no rub, nl s1 s2                           - JVD-none, edema- none, stasis changes- none, varices- none           Lung- clear to P&A, no rales or crackles, wheeze- none, cough- none , dullness-none, rub- none           Chest wall-  Abd-  Br/ Gen/ Rectal- Not done, not indicated Extrem- cyanosis- none, clubbing, none, atrophy- none, strength- nl Neuro- grossly intact to observation

## 2021-04-05 ENCOUNTER — Ambulatory Visit (INDEPENDENT_AMBULATORY_CARE_PROVIDER_SITE_OTHER): Payer: Medicare Other | Admitting: Internal Medicine

## 2021-04-05 ENCOUNTER — Other Ambulatory Visit: Payer: Self-pay

## 2021-04-05 ENCOUNTER — Encounter: Payer: Self-pay | Admitting: Internal Medicine

## 2021-04-05 DIAGNOSIS — Z9989 Dependence on other enabling machines and devices: Secondary | ICD-10-CM

## 2021-04-05 DIAGNOSIS — G4733 Obstructive sleep apnea (adult) (pediatric): Secondary | ICD-10-CM

## 2021-04-05 DIAGNOSIS — I5032 Chronic diastolic (congestive) heart failure: Secondary | ICD-10-CM | POA: Diagnosis not present

## 2021-04-05 NOTE — Assessment & Plan Note (Signed)
Benefits from CPAP when used. Currently moving, dealing with nocturia.  Plan- Awaiting replacement machine. We can retitrate later if needed. Emphasis on education about compliance goals. PCP might be able to help by adjusting meds to reduce nocturia if appropriate.

## 2021-04-05 NOTE — Assessment & Plan Note (Signed)
This is a major issue. Consider referral to [facility].

## 2021-04-05 NOTE — Assessment & Plan Note (Signed)
Not in overt R heart CHF at this visit. Managed elsewhere.

## 2021-04-05 NOTE — Patient Instructions (Signed)
Our goal with CPAP is to try to use it at least 4 hours every night.  You can talk to your primary doctor about having to use the bathroom so much at night. Maybe they can suggest changes in your medicines.  Please call us if we can help with your sleep or with your breathing.

## 2021-04-08 ENCOUNTER — Other Ambulatory Visit: Payer: Self-pay | Admitting: Registered Nurse

## 2021-04-08 ENCOUNTER — Other Ambulatory Visit (HOSPITAL_COMMUNITY): Payer: Medicare Other

## 2021-04-08 ENCOUNTER — Ambulatory Visit: Payer: Medicare Other | Admitting: *Deleted

## 2021-04-08 ENCOUNTER — Telehealth: Payer: Self-pay | Admitting: Gastroenterology

## 2021-04-08 DIAGNOSIS — I5032 Chronic diastolic (congestive) heart failure: Secondary | ICD-10-CM | POA: Diagnosis not present

## 2021-04-08 DIAGNOSIS — E1142 Type 2 diabetes mellitus with diabetic polyneuropathy: Secondary | ICD-10-CM | POA: Diagnosis not present

## 2021-04-08 DIAGNOSIS — Z794 Long term (current) use of insulin: Secondary | ICD-10-CM | POA: Diagnosis not present

## 2021-04-08 NOTE — Telephone Encounter (Signed)
Spoke with the pt  She states that she called [place] to reschedule her colonoscopy  Since the time of her call, she realized that she contacted the wrong office  She called her GI MD and had the procedure rescheduled  Will close encounter

## 2021-04-08 NOTE — Telephone Encounter (Signed)
Pt wants to r/s colon that is scheduled on 6/23 at [facility]. Pt states that she cancelled it two weeks ago when she received a call from the hospital. She was told that she will received a call to r/s. She wants to r/s procedure after July 8. Pls call her.

## 2021-04-08 NOTE — Patient Instructions (Signed)
Visit Information  PATIENT GOALS:  Goals Addressed             This Visit's Progress    Monitor and Manage My Blood Sugar-Diabetes Type 2   On track    Timeframe:  Long-Range Goal Priority:  Medium Start Date:     03/25/21                        Expected End Date:      03/25/22                 Follow Up Date 05/17/21   Keep up the great work monitoring and writing down your blood sugars at home: first thing in the morning before eating and then again 2 hours after a meal: this will help your doctors know if your medications are working or if they need adjusting: we will review these values each time we talk over the phone  Check blood sugar if I feel it is too high or too low: if you have questions about whether your continuous Free-Style Elenor Legato is accurate, continue to compare the finger-stick glucometer values-- if this is an ongoing concern- please talk to your diabetes doctor (Dr. Kelton Pillar) about this during your next scheduled office visit on May 15, 2021 Take your recorded blood sugars from home to all doctor visits  I am glad that you obtained your diabetic shoes: continue keeping a close eye on your feet and contact your diabetes doctor or your primary care doctor if you develop concerns about the appearance of your feet Try to stay as active as possible Keep trying to follow a low carbohydrate and low sugar diet Please continue reading over the booklet "Living Well with Diabetes"   Why is this important?   Checking your blood sugar at home helps to keep it from getting very high or very low.  Writing the results in a diary or log helps the doctor know how to care for you.  Your blood sugar log should have the time, date and the results.  Also, write down the amount of insulin or other medicine that you take.  Other information, like what you ate, exercise done and how you were feeling, will also be helpful.           Track and Manage Fluids and Swelling-Heart Failure    On track    Timeframe:  Long-Range Goal Priority:  Medium Start Date:   03/25/21                          Expected End Date:       03/25/22                Follow Up Date 05/17/21   [name] job weighing daily and writing it down on paper Call cardiology office if I gain more than 2 pounds in one day or 5 pounds in one week Do ankle pumps throughout the day when sitting Keep legs up while sitting Keep up following a "No Added Salt diet" and continue watching the food labels for salt content with the foods you eat and prepare  Watch for swelling in feet, ankles and legs every day; if you remain concerned about the swelling in your ankles/ legs, you should contact your cardiology provider asap and make another appointment Keep wearing the compression stockings that the cardiology staff recommended- wear as long as you are able each  day Please listen out for the Pharmacy team from Dr. Barry Brunner office to call you-- they will help you work with your doctors to manage and optimize your medications   Why is this important?   It is important to check your weight daily and watch how much salt and liquids you have.  It will help you to manage your heart failure.            The patient verbalized understanding of instructions, educational materials, and care plan provided today and declined offer to receive copy of patient instructions, educational materials, and care plan- due to travel plans in the coming weeks  Telephone follow up appointment with care management team member scheduled for: Friday May 17, 2021 at 3:30 pm The patient has been provided with contact information for the care management team and has been advised to call with any health related questions or concerns.   [name], RN, BSN, [facility] RN Care Coordination- [place] [phone]: direct office [phone]: mobile

## 2021-04-08 NOTE — Chronic Care Management (AMB) (Signed)
Chronic Care Management   CCM RN Visit Note  04/08/2021 Name: [name] MRN: [id number] DOB: 1941-09-16  Subjective: Tuesday [name] is a 80 y.o. year old female who is a primary care patient of Sagardia, Ines Bloomer, MD. The care management team was consulted for assistance with disease management and care coordination needs.    Engaged with patient by telephone for follow up visit in response to provider referral for case management and/or care coordination services.   Consent to Services:  The patient was given information about Chronic Care Management services, agreed to services, and gave verbal consent prior to initiation of services.  Please see initial visit note for detailed documentation.   Patient agreed to services and verbal consent obtained.   Assessment: Review of patient past medical history, allergies, medications, health status, including review of consultants reports, laboratory and other test data, was performed as part of comprehensive evaluation and provision of chronic care management services.   CCM Care Plan  Allergies  Allergen Reactions   Metformin Diarrhea    Outpatient Encounter Medications as of 04/08/2021  Medication Sig Note   Accu-Chek Softclix Lancets lancets 1 each by Other route 3 (three) times daily. as directed    allopurinol (ZYLOPRIM) 300 MG tablet TAKE 1 TABLET(300 MG) BY MOUTH DAILY    amiodarone (PACERONE) 200 MG tablet Take 1 tablet (200 mg total) by mouth daily.    amLODipine (NORVASC) 5 MG tablet Take 1 tablet by mouth daily.    blood glucose meter kit and supplies Dispense based on patient and insurance preference. Use up to four times daily as directed. (FOR ICD-10 E10.9, E11.9). 01/23/2021: use   cloNIDine (CATAPRES) 0.3 MG tablet TAKE 1 TABLET(0.3 MG) BY MOUTH TWICE DAILY    Continuous Blood Gluc Receiver (FREESTYLE LIBRE 14 DAY READER) DEVI Use as directed. 01/23/2021: use   Continuous Blood Gluc Sensor (FREESTYLE LIBRE 14 DAY  SENSOR) MISC Use as directed to check blood sugar daily 01/23/2021: use   diltiazem (CARDIZEM) 30 MG tablet Take 30 mg by mouth. Take one tablet every 4 hours as needed for heart rate greater than 100 and blood pressure needs to be above 100 03/25/2021: 03/25/21: Reports used last "about 3 weeks ago"- reports took one pill only    ELIQUIS 5 MG TABS tablet TAKE 1 TABLET(5 MG) BY MOUTH TWICE DAILY    Insulin Lispro Prot & Lispro (HUMALOG MIX 75/25 KWIKPEN) (75-25) 100 UNIT/ML Kwikpen Inject 15 Units into the skin 2 (two) times daily.    levothyroxine (SYNTHROID) 75 MCG tablet Take 1 tablet (75 mcg total) by mouth daily before breakfast.    lidocaine (LIDODERM) 5 % Place 1 patch onto the skin daily. Remove & Discard patch within 12 hours or as directed by MD 01/23/2021: prn   loperamide (IMODIUM) 2 MG capsule Take 1 capsule (2 mg total) by mouth 4 (four) times daily as needed for diarrhea or loose stools.    losartan (COZAAR) 100 MG tablet Take 1 tablet (100 mg total) by mouth daily.    Na Sulfate-K Sulfate-Mg Sulf (SUPREP BOWEL PREP KIT) 17.5-3.13-1.6 GM/177ML SOLN Take 1 kit by mouth as directed.    NOVOFINE PLUS PEN NEEDLE 32G X 4 MM MISC USE TWICE DAILY AS DIRECTED    ONETOUCH VERIO test strip 1 each 3 (three) times daily. 01/23/2021: use   OZEMPIC, 1 MG/DOSE, 4 MG/3ML SOPN Inject into the skin.    potassium chloride (KLOR-CON) 10 MEQ tablet Take 2 tablets (20 mEq total) by  mouth daily.    RESTASIS MULTIDOSE 0.05 % ophthalmic emulsion Place 1 drop into both eyes 2 (two) times daily.     rosuvastatin (CRESTOR) 10 MG tablet Take 10 mg by mouth every evening.    tiZANidine (ZANAFLEX) 4 MG tablet Take 1 tablet (4 mg total) by mouth every 8 (eight) hours as needed for muscle spasms.    torsemide (DEMADEX) 20 MG tablet TAKE 2 TABLETS(40 MG) BY MOUTH DAILY    triamcinolone cream (KENALOG) 0.1 % Apply 1 application topically 2 (two) times daily.    verapamil (CALAN) 120 MG tablet Take 1 tablet (120 mg total) by  mouth 2 (two) times daily. 03/25/2021: 03/25/21: Pt reports she was told by cardiology NP 03/12/21 to reduce dose to QD: note reviewed; NP documented to take "prn" Diltiazem: CCM pharmacy referral requested for review/ clarification/ message sent to cards NP    No facility-administered encounter medications on file as of 04/08/2021.    Patient Active Problem List   Diagnosis Date Noted   Dysthymia 03/12/2021   Dysuria 03/01/2021   Primary osteoarthritis involving multiple joints 03/01/2021   Recurrent falls 01/10/2021   Sensorineural hearing loss (SNHL), 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   Persistent atrial fibrillation ([place]) 08/12/2020   Type 2 diabetes mellitus with hyperglycemia, with long-term current use of insulin ([place]) 06/21/2020   Type 2 diabetes mellitus with diabetic polyneuropathy, with long-term current use of insulin ([place]) 06/21/2020   Mixed hyperlipidemia 06/21/2020   Uncontrolled hypertension 06/11/2020   Aortic atherosclerosis ([place]) 03/28/2020   Diabetic peripheral neuropathy ([place]) 12/26/2019   Intertrigo 10/23/2019   Gastroesophageal reflux disease without esophagitis 03/17/2019   Rash 03/17/2019   Bilateral leg edema 02/16/2019   Pruritus 02/16/2019   Varicose veins of both legs with edema 02/16/2019   Heart murmur 09/05/2018   Restrictive lung disease 02/22/2018   Dysgeusia 06/11/2017   Allergic dermatitis 06/03/2017   Osteoarthritis of left knee 06/03/2017   NAFLD (nonalcoholic fatty liver disease) 08/07/2016   Osteoporosis 12/31/2015   Obesity 12/19/2015   Hypersomnia 12/19/2015   Edema 10/18/2015   Acquired hypothyroidism 03/49/1791   Acute diastolic congestive heart failure, NYHA class 3 ([place]) 10/03/2013   Prolonged QT interval 50/56/9794   Acute diastolic heart failure ([place]) 10/03/2013   Long term (current) use of anticoagulants 06/09/2012   BENIGN NEOPLASM OF ADRENAL GLAND 11/25/2010   Chronic diastolic heart failure ([place]) 02/20/2010   DM 05/16/2009   GOUT  05/16/2009   OBESITY, MORBID 05/16/2009   Hypertension associated with diabetes ([place]) 05/16/2009   Paroxysmal atrial fibrillation ([place]) 05/16/2009   HYPERLIPIDEMIA 11/30/2008   Obstructive sleep apnea on CPAP 11/30/2008    Conditions to be addressed/monitored:CHF and DMII  Care Plan : Heart Failure (Adult)  Updates made by Knox Royalty, RN since 04/08/2021 12:00 AM     Problem: Symptom Exacerbation (Heart Failure)   Priority: Medium     Long-Range Goal: Symptom Exacerbation Prevented or Minimized   Start Date: 03/25/2021  Expected End Date: 03/25/2022  This Visit's Progress: On track  Recent Progress: On track  Priority: Medium  Note:   Current Barriers:  Knowledge deficit related to basic heart failure pathophysiology and self care management- has general baseline knowledge; will require ongoing reinforcement of same Knowledge Deficits related to heart failure medications- needs thorough review/ optimization of medications: CCM Pharmacy referral placed Does not adhere to provider recommendations re: daily weight monitoring/ recording: agreeable to resume this practice Case Manager Clinical Goal(s):  Over the next 12 months, patient will  complete the following as evidenced by patient reporting during CCM RN CM outreach of: verbalize understanding of Heart Failure Action Plan and when to call doctor take all Heart Failure mediations as prescribed monitor/ record daily weights at home (notifying MD of 3 lb weight gain over night or 5 lb in a week) Interventions:  Collaboration with Horald Pollen, MD regarding development and update of comprehensive plan of care as evidenced by provider attestation and co-signature Inter-disciplinary care team collaboration (see longitudinal plan of care) Chart reviewed including relevant office notes, upcoming scheduled appointments, and lab results Discussed current clinical condition with patient who continues to verbalize concerns around  lower extremity swelling-- states she has been using the compression stockings, but they "don't really help;" states they are so tight she can only wear for 3-4 hours per day; patient continues to deny shortness of breath or any change/ difficulty in breathing/ chest tightness; her only concern continues to be "swelling in my ankles;" she was again encouraged to keep her cardiology provided updated Reiterated previously provided education around signs/ symptoms CHF yellow zone and weight gain guidelines in setting of CHF: patient reports that she has not gained > 3 lbs overnight and adamantly denies shortness of breath; confirms she contacted cardiology provider around previously reported LE swelling; stated she was offered additional diuretics, but declines this, as she is "already spending all day in the bathroom" with her current regimen Confirmed that she was contacted by PCP office team in follow up to UTI, provided sample, no further issues; patient also acknowledges that she contacted her insurance company regarding the follow up for bariatric walker and psych referral: these items remain pending; patient reports that she and her husband will be travelling for next few weeks to visit family; she agrees to resume her efforts with her insurance provider once she returns to her home after traveling Reviewed recent daily weights at home with patient: she consistently reports daily weights between 250-252 lbs, again denies overnight weight gain > 2-3 lbs; denies weekly weight gain > 5 lbs Encouraged patient to try to adhere to low sodium diet while traveling  Confirmed patient has re-scheduled her colonoscopy due to travel plans Reinforced previously provided education around self-health management of CHF- reinforcement and verbal education provided- patient will require ongoing reinforcement of same Reinforced previously provided education around signs/ symptoms yellow CHF zone/ weight gain guidelines in  setting of CHF along with corresponding action plan; today she again denies additional signs/ symptoms such as shortness of breath, chest/ abdominal tightness, cough; re- reviewed Heart Failure Action Plan/ weight gain guidelines in depth  Confirmed patient continues to endorse following low salt, heart healthy diet Reviewed upcoming provider appointments with patient and confirmed that patient has plans to attend all as scheduled: 05/15/21 endocrinology Self-Care Activities: Patient verbalizes understanding of plan to resume daily weight monitoring and recording Self administers medications as prescribed-- occasionally reduces diuretic dose when she has to leave home: reports excessive urination with currently prescribed diuretic to the point that she is unable to leave home Attends all scheduled provider appointments Calls pharmacy for medication refills Attends church or other social activities Performs ADL's/ iADL's independently Calls provider office for new concerns or questions Patient Goals: [name] job weighing daily and writing it down on paper Call cardiology office if I gain more than 2 pounds in one day or 5 pounds in one week Do ankle pumps throughout the day when sitting Keep legs up while sitting Keep up following a "  No Added Salt diet" and continue watching the food labels for salt content with the foods you eat and prepare  Watch for swelling in feet, ankles and legs every day; if you remain concerned about the swelling in your ankles/ legs, you should contact your cardiology provider asap and make another appointment Keep wearing the compression stockings that the cardiology staff recommended- wear as long as you are able each day Please listen out for the Pharmacy team from Dr. Barry Brunner office to call you-- they will help you work with your doctors to manage and optimize your medications Follow Up Plan:  Telephone follow up appointment with care management team member  scheduled for: Friday, May 17, 2021 at 3:15- 3:30 pm The patient has been provided with contact information for the care management team and has been advised to call with any health related questions or concerns.      Care Plan : Diabetes Type 2 (Adult)  Updates made by Knox Royalty, RN since 04/08/2021 12:00 AM     Problem: Glycemic Management (Diabetes, Type 2)   Priority: Medium     Long-Range Goal: Glycemic Management Optimized   Start Date: 03/25/2021  Expected End Date: 03/25/2022  This Visit's Progress: On track  Recent Progress: On track  Priority: Medium  Note:   Objective:  Lab Results  Component Value Date   HGBA1C 7.4 (A) 11/29/2020   Lab Results  Component Value Date   CREATININE 0.74 03/12/2021   CREATININE 0.80 01/23/2021   CREATININE 0.72 01/03/2021   Lab Results  Component Value Date   EGFR 83 03/12/2021   Current Barriers:  Knowledge Deficits related to basic Diabetes pathophysiology and self care/management- has fair baseline understanding, could benefit from ongoing reinforcement of same Does not know how to utilize history function of CGM- will require ongoing reinforcement of same  Case Manager Clinical Goal(s):  Over the next 12 months, patient will demonstrate improved adherence to prescribed treatment plan for diabetes self care/management as evidenced by patient reporting during CCM RN CM outreach of:   daily monitoring and recording of blood sugars at home using CGM and glucometer, as indicated   adherence to ADA/ carb modified/ low sugar diet  adherence to prescribed medication regimen Interventions:  Collaboration with Horald Pollen, MD regarding development and update of comprehensive plan of care as evidenced by provider attestation and co-signature Inter-disciplinary care team collaboration (see longitudinal plan of care) Review of patient status, including review of consultants reports, relevant laboratory and other test results,  and medications completed Chart reviewed including relevant office notes, upcoming scheduled appointments, and lab results: last A1-C noted: 7.4 (11/29/20) Attempted to review with patient specific blood sugar readings at home since last outreach: patient reports unable to complete specific review; she requires ongoing reinforcement in using history function of CGM; today, she reports general ranges between 130-170; we discussed a specific isolated episode patient reports today where she took her medication as prescribed in the morning and had a subsequent hypoglycemic episode with blood sugar < 70; reports she corrected this immediately by drinking OJ which resolved the episode; reports only one such episode since last outreach: positive reinforcement provided for immediately addressing signs/ symptoms low blood sugar at home Reviewed signs/ symptoms low blood sugar with patient, along with corresponding action plan- patient will continue to require ongoing reinforcement Confirmed patient attended recent podiatry provider appointment and successfully obtained her diabetic shoes- unfortunately she reports ongoing swelling of lower extremities, and she has not  been able to wear her diabetic shoes as much as she would like to, due to ongoing LE swelling Confirmed patient received previously provided printed educational material mailed to her at time of last outreach- patient reports has reviewed "a little bit;" encouraged patient's ongoing and regular review  Confirmed patient has continued monitoring/ recording blood sugars at home using FSL-CGM, and glucometer: she reports again today several occasions where [place] indicated blood sugar results that were significantly inconsistent with glucometer; I reminded her to share this important information with her endocrinologist at scheduled appointment 05/15/21- she verbalizes understanding/ agreement Discussed plans with patient for ongoing care management follow up  and provided patient with direct contact information for care management team. Self-Care Activities UNABLE to independently check historical values using CGM/ FSL: will require ongoing reinforcement Self administers insulin and injectable DM medicationas (Ozempic) prescribed Attends all scheduled provider appointments Checks blood sugars as prescribed and compares CGM to glucometer values when indicated Attempts to adhere to prescribed ADA/carb modified/ low sugar diet: could benefit from ongoing reinforcement of dietary management of DM Patient Goals: Keep up the great work monitoring and writing down your blood sugars at home: first thing in the morning before eating and then again 2 hours after a meal: this will help your doctors know if your medications are working or if they need adjusting: we will review these values each time we talk over the phone  Check blood sugar if I feel it is too high or too low: if you have questions about whether your continuous Free-Style Elenor Legato is accurate, continue to compare the finger-stick glucometer values-- if this is an ongoing concern- please talk to your diabetes doctor (Dr. Kelton Pillar) about this during your next scheduled office visit on May 15, 2021 Take your recorded blood sugars from home to all doctor visits  I am glad that you obtained your diabetic shoes: continue keeping a close eye on your feet and contact your diabetes doctor or your primary care doctor if you develop concerns about the appearance of your feet Try to stay as active as possible Keep trying to follow a low carbohydrate and low sugar diet Please continue reading over the booklet "Living Well with Diabetes" Follow Up Plan:  Telephone follow up appointment with care management team member scheduled for: Friday May 17, 2021 at 3:15- 3:30 pm The patient has been provided with contact information for the care management team and has been advised to call with any health related  questions or concerns.       Plan: Telephone follow up appointment with care management team member scheduled for:  Friday, May 17, 2021 at 3:15-3:30 pm The patient has been provided with contact information for the care management team and has been advised to call with any health related questions or concerns.   [name], RN, BSN, [facility] RN Care Coordination- [place] [phone]: direct office [phone]: mobile

## 2021-04-08 NOTE — Telephone Encounter (Signed)
Patient advised that colonoscopy has been rescheduled for 05-09-2021 at 930am.  Patient instructions and times for colonoscopy discussed by phone.  New prep instructions will be mailed to the patient.  Patient advised to hold Eliquis 2 days prior to procedure starting hold after last dose on 05-06-2021.  Patient agreed to plan and verbalized understanding.  No further questions.

## 2021-04-10 ENCOUNTER — Telehealth: Payer: Self-pay | Admitting: Internal Medicine

## 2021-04-10 ENCOUNTER — Telehealth: Payer: Self-pay | Admitting: Emergency Medicine

## 2021-04-10 DIAGNOSIS — N343 Urethral syndrome, unspecified: Secondary | ICD-10-CM

## 2021-04-10 DIAGNOSIS — H538 Other visual disturbances: Secondary | ICD-10-CM | POA: Diagnosis not present

## 2021-04-10 DIAGNOSIS — H1131 Conjunctival hemorrhage, right eye: Secondary | ICD-10-CM | POA: Diagnosis not present

## 2021-04-10 DIAGNOSIS — Z87440 Personal history of urinary (tract) infections: Secondary | ICD-10-CM

## 2021-04-10 NOTE — Telephone Encounter (Signed)
Pt c/o swelling: STAT is pt has developed SOB within 24 hours  How much weight have you gained and in what time span?  No noticeable weight gain   If swelling, where is the swelling located?  Patient states the swelling is mainly in her left foot and it doesn't go down at all. She states she has also had tingling and throbbing in her left foot. She states she is diabetic, but her sugar is under control.  Are you currently taking a fluid pill?  Yes and she states it causes frequent urination (especially at night). She would like to know how much water she should be drinking.  Are you currently SOB?  No, patient denies SOB   Do you have a log of your daily weights (if so, list)?  No log available  Have you gained 3 pounds in a day or 5 pounds in a week?  No   Have you traveled recently?   No, however, patient states on 06/24 she plans to travel to [place] and stay there for 2 weeks.  She states she purchased compression socks and wore them for at least 3 hours, but they were very tight and uncomfortable.

## 2021-04-10 NOTE — Telephone Encounter (Signed)
Patient called and said that she is having frequent urination and pressure when urinating. She said that she is leaving to go out of town on Friday and will be gone for 2 weeks. Please advise. She can be reached at [phone]

## 2021-04-11 NOTE — Telephone Encounter (Signed)
Spoke with pt.  Pt states she is going to be traveling by plane to [place] and then to [place] by car.  Her car ride is a 9 hour trip.  Pt advised she will need to get out of the car and move around every 1-2 hours.  Pt encouraged to wear compression stockings as much as possible and continue on low Na+ diet, take medications as prescribed and elevate fett and legs above heart level as much as possible.  Pt verbalizes understanding and thanked Therapist, sports for the call.

## 2021-04-11 NOTE — Telephone Encounter (Signed)
Patient called again

## 2021-04-11 NOTE — Progress Notes (Signed)
Patient left without being seen. No charge.

## 2021-04-11 NOTE — Telephone Encounter (Signed)
   Patient requesting order for urine test

## 2021-04-12 NOTE — Telephone Encounter (Signed)
Called patient to discuss urine problem, but spouse Cristie Hem) on the HIPPA form answered the phone. He states she was in the restroom they were at the airport and when she comes out he will have her to call back.

## 2021-04-18 NOTE — Addendum Note (Signed)
Addended by: Alfredia Ferguson A on: 04/18/2021 11:31 AM   Modules accepted: Orders

## 2021-04-18 NOTE — Addendum Note (Signed)
Addended by: Davina Poke on: 04/18/2021 11:37 AM   Modules accepted: Orders

## 2021-04-18 NOTE — Telephone Encounter (Signed)
Spoke to patient, she went to the Urgent Care in [facility] before leaving to go out of town. The doctor prescribed an antibiotic, which she forgot and a friend sent it to her this morning. Also, she requesting a referral to a Urologist for frequent UTIs.

## 2021-04-18 NOTE — Telephone Encounter (Signed)
Done. Thank you.

## 2021-04-24 ENCOUNTER — Other Ambulatory Visit: Payer: Self-pay | Admitting: Emergency Medicine

## 2021-04-25 ENCOUNTER — Other Ambulatory Visit: Payer: Self-pay | Admitting: Emergency Medicine

## 2021-04-30 NOTE — Telephone Encounter (Signed)
Returned call to patient regarding rescheduling her procedure.  Patient states she has recently moved into a friends garage which does not have a bathroom attached.  She would rather wait until she has a better living situation before she is scheduled for her procedure.  Patient requested to be moved out until August.  Patient advised she has been rescheduled on 06-13-2021 at 930am.  Patient agreed to plan and verbalized understanding.  No further questions.

## 2021-04-30 NOTE — Telephone Encounter (Signed)
Inbound call from patient stating she needs to reschedule procedure.  Please advise.

## 2021-05-02 DIAGNOSIS — G4733 Obstructive sleep apnea (adult) (pediatric): Secondary | ICD-10-CM | POA: Diagnosis not present

## 2021-05-02 DIAGNOSIS — I10 Essential (primary) hypertension: Secondary | ICD-10-CM | POA: Diagnosis not present

## 2021-05-06 DIAGNOSIS — K91840 Postprocedural hemorrhage and hematoma of a digestive system organ or structure following a digestive system procedure: Secondary | ICD-10-CM | POA: Diagnosis not present

## 2021-05-08 ENCOUNTER — Emergency Department (HOSPITAL_COMMUNITY)
Admission: EM | Admit: 2021-05-08 | Discharge: 2021-05-08 | Disposition: A | Discharge status: Home / Self Care | Payer: Medicare Other | Attending: Emergency Medicine | Admitting: Emergency Medicine

## 2021-05-08 ENCOUNTER — Encounter (HOSPITAL_COMMUNITY): Payer: Self-pay | Admitting: Emergency Medicine

## 2021-05-08 ENCOUNTER — Other Ambulatory Visit: Payer: Self-pay

## 2021-05-08 DIAGNOSIS — K068 Other specified disorders of gingiva and edentulous alveolar ridge: Secondary | ICD-10-CM | POA: Diagnosis not present

## 2021-05-08 DIAGNOSIS — Z79899 Other long term (current) drug therapy: Secondary | ICD-10-CM | POA: Diagnosis not present

## 2021-05-08 DIAGNOSIS — R001 Bradycardia, unspecified: Secondary | ICD-10-CM | POA: Insufficient documentation

## 2021-05-08 DIAGNOSIS — E1122 Type 2 diabetes mellitus with diabetic chronic kidney disease: Secondary | ICD-10-CM | POA: Insufficient documentation

## 2021-05-08 DIAGNOSIS — K91840 Postprocedural hemorrhage and hematoma of a digestive system organ or structure following a digestive system procedure: Secondary | ICD-10-CM | POA: Insufficient documentation

## 2021-05-08 DIAGNOSIS — Z794 Long term (current) use of insulin: Secondary | ICD-10-CM | POA: Insufficient documentation

## 2021-05-08 DIAGNOSIS — Z7901 Long term (current) use of anticoagulants: Secondary | ICD-10-CM | POA: Diagnosis not present

## 2021-05-08 DIAGNOSIS — E1142 Type 2 diabetes mellitus with diabetic polyneuropathy: Secondary | ICD-10-CM | POA: Insufficient documentation

## 2021-05-08 DIAGNOSIS — E1140 Type 2 diabetes mellitus with diabetic neuropathy, unspecified: Secondary | ICD-10-CM | POA: Diagnosis not present

## 2021-05-08 DIAGNOSIS — E039 Hypothyroidism, unspecified: Secondary | ICD-10-CM | POA: Insufficient documentation

## 2021-05-08 DIAGNOSIS — I5032 Chronic diastolic (congestive) heart failure: Secondary | ICD-10-CM | POA: Insufficient documentation

## 2021-05-08 DIAGNOSIS — I130 Hypertensive heart and chronic kidney disease with heart failure and stage 1 through stage 4 chronic kidney disease, or unspecified chronic kidney disease: Secondary | ICD-10-CM | POA: Diagnosis not present

## 2021-05-08 DIAGNOSIS — N189 Chronic kidney disease, unspecified: Secondary | ICD-10-CM | POA: Insufficient documentation

## 2021-05-08 LAB — CBC
HCT: 37.5 % (ref 36.0–46.0)
Hemoglobin: 11.4 g/dL — ABNORMAL LOW (ref 12.0–15.0)
MCH: 26 pg (ref 26.0–34.0)
MCHC: 30.4 g/dL (ref 30.0–36.0)
MCV: 85.4 fL (ref 80.0–100.0)
Platelets: 324 10*3/uL (ref 150–400)
RBC: 4.39 MIL/uL (ref 3.87–5.11)
RDW: 15.9 % — ABNORMAL HIGH (ref 11.5–15.5)
WBC: 12.8 10*3/uL — ABNORMAL HIGH (ref 4.0–10.5)
nRBC: 0 % (ref 0.0–0.2)

## 2021-05-08 LAB — TYPE AND SCREEN
ABO/RH(D): O POS
Antibody Screen: NEGATIVE

## 2021-05-08 MED ORDER — FENTANYL CITRATE (PF) 100 MCG/2ML IJ SOLN
50.0000 ug | Freq: Once | INTRAMUSCULAR | Status: AC
  Administered 2021-05-08: 50 ug via INTRAVENOUS
  Filled 2021-05-08: qty 2

## 2021-05-08 MED ORDER — TRANEXAMIC ACID FOR EPISTAXIS
500.0000 mg | Freq: Once | TOPICAL | Status: AC
  Administered 2021-05-08: 500 mg via TOPICAL
  Filled 2021-05-08: qty 10

## 2021-05-08 MED ORDER — HYDROCODONE-ACETAMINOPHEN 5-325 MG PO TABS: 1.0000 | ORAL_TABLET | Freq: Once | ORAL | Status: DC

## 2021-05-08 NOTE — ED Notes (Signed)
Cold tea bags placed inside pt's mouth per Dr. Tyron Russell order.

## 2021-05-08 NOTE — ED Triage Notes (Signed)
Patient went to dentist a few days ago, had stitches put in upper right jaw.  Patient is on eliquis.  Patient is continually bleeding in mouth.  Patient concerned about the bleeding.

## 2021-05-08 NOTE — ED Notes (Signed)
TXA gauze removed by provider.

## 2021-05-08 NOTE — ED Provider Notes (Signed)
[facility] EMERGENCY DEPARTMENT Provider Note   CSN: [id number] Arrival date & time: 05/08/21  0456     History Chief Complaint  Patient presents with   Coagulation Disorder    [name] is a 80 y.o. female.  HPI 80 year old female presents with intermittent bleeding from her teeth.  She had a dental procedure a couple days ago and has had intermittent bleeding.  She is on Eliquis and this was not stopped prior to the procedure.  She has been to the surgeon a couple times and had multiple stitches placed.  However last night at around 3 AM had recurrent bleeding and could not get it to stop.  She is also having pain to her gums/teeth.  No dizziness, lightheadedness, chest pain, shortness of breath.  Past Medical History:  Diagnosis Date   Arthritis    Back pain    Chronic anticoagulation    due to aflutter   Chronic kidney disease    Diabetes mellitus    Diastolic CHF, chronic ([place])    a.  echo 2006 - ef 55-65%; mild diast dysfxn;    b. Echo 08/2011: Mild LVH, EF 60%;  c. 04/2013 Echo: EF 65-69%, mild conc LVH;  08/2014 Echo: EF 60-65%, mild-mod MR.   Gout    Hyperlipidemia    Hypertension    a.  Renal arterial Dopplers 12/2011: 1-59% right renal artery stenosis   Morbid obesity ([place])    Obstructive sleep apnea on CPAP    Paroxysmal Afib/Flutter    a. 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    Patient Active Problem List   Diagnosis Date Noted   Dysthymia 03/12/2021   Dysuria 03/01/2021   Primary osteoarthritis involving multiple joints 03/01/2021   Recurrent falls 01/10/2021   Sensorineural hearing loss (SNHL), 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   Persistent atrial fibrillation ([place]) 08/12/2020   Type 2 diabetes mellitus with hyperglycemia, with long-term current use of insulin ([place]) 06/21/2020   Type 2 diabetes mellitus with diabetic polyneuropathy, with long-term current use of insulin ([place]) 06/21/2020   Mixed hyperlipidemia 06/21/2020   Uncontrolled  hypertension 06/11/2020   Aortic atherosclerosis ([place]) 03/28/2020   Diabetic peripheral neuropathy ([place]) 12/26/2019   Intertrigo 10/23/2019   Gastroesophageal reflux disease without esophagitis 03/17/2019   Rash 03/17/2019   Bilateral leg edema 02/16/2019   Pruritus 02/16/2019   Varicose veins of both legs with edema 02/16/2019   Heart murmur 09/05/2018   Restrictive lung disease 02/22/2018   Dysgeusia 06/11/2017   Allergic dermatitis 06/03/2017   Osteoarthritis of left knee 06/03/2017   NAFLD (nonalcoholic fatty liver disease) 08/07/2016   Osteoporosis 12/31/2015   Obesity 12/19/2015   Hypersomnia 12/19/2015   Edema 10/18/2015   Acquired hypothyroidism 83/38/2505   Acute diastolic congestive heart failure, NYHA class 3 ([place]) 10/03/2013   Prolonged QT interval 39/76/7341   Acute diastolic heart failure ([place]) 10/03/2013   Long term (current) use of anticoagulants 06/09/2012   BENIGN NEOPLASM OF ADRENAL GLAND 11/25/2010   Chronic diastolic heart failure ([place]) 02/20/2010   DM 05/16/2009   GOUT 05/16/2009   OBESITY, MORBID 05/16/2009   Hypertension associated with diabetes ([place]) 05/16/2009   Paroxysmal atrial fibrillation ([place]) 05/16/2009   HYPERLIPIDEMIA 11/30/2008   Obstructive sleep apnea on CPAP 11/30/2008    Past Surgical History:  Procedure Laterality Date   APPENDECTOMY     ATRIAL FLUTTER ABLATION N/A 09/24/2011   Procedure: ATRIAL FLUTTER ABLATION;  Surgeon: [name], MD;  Location: [facility] CATH LAB;  Service: Cardiovascular;  Laterality: N/A;   CARDIOVERSION  10/22/2011   Procedure: CARDIOVERSION;  Surgeon: [name], MD;  Location: [facility];  Service: Cardiovascular;  Laterality: N/A;   CARDIOVERSION N/A 09/10/2011   Procedure: CARDIOVERSION;  Surgeon: [name], MD;  Location: [facility] CATH LAB;  Service: Cardiovascular;  Laterality: N/A;   CHOLECYSTECTOMY     TONSILLECTOMY  1982   TOTAL ABDOMINAL HYSTERECTOMY       OB History   No obstetric history on file.      Family History  Problem Relation Age of Onset   Heart disease Father    Hypertension Father    Breast cancer Sister    Cancer Sister        breast   Colon cancer Neg Hx    Esophageal cancer Neg Hx    Pancreatic cancer Neg Hx    Liver disease Neg Hx     Social History   Tobacco Use   Smoking status: Never   Smokeless tobacco: Never  Vaping Use   Vaping Use: Never used  Substance Use Topics   Alcohol use: No   Drug use: No    Home Medications Prior to Admission medications   Medication Sig Start Date End Date Taking? Authorizing Provider  Accu-Chek Softclix Lancets lancets 1 each by Other route 3 (three) times daily. as directed 03/30/20  Yes Maximiano Coss, NP  allopurinol (ZYLOPRIM) 300 MG tablet TAKE 1 TABLET(300 MG) BY MOUTH DAILY Patient taking differently: Take 300 mg by mouth daily. 01/21/21  Yes Sagardia, Ines Bloomer, MD  amiodarone (PACERONE) 200 MG tablet Take 1 tablet (200 mg total) by mouth daily. 03/20/21  Yes [name], MD  blood glucose meter kit and supplies Dispense based on patient and insurance preference. Use up to four times daily as directed. (FOR ICD-10 E10.9, E11.9). Patient taking differently: 1 each by Other route See admin instructions. Dispense based on patient and insurance preference. Use up to four times daily as directed. (FOR ICD-10 E10.9, E11.9). 05/03/20  Yes Sagardia, Ines Bloomer, MD  cloNIDine (CATAPRES) 0.3 MG tablet TAKE 1 TABLET(0.3 MG) BY MOUTH TWICE DAILY Patient taking differently: Take 0.3 mg by mouth 2 (two) times daily. 03/31/21  Yes Sagardia, Ines Bloomer, MD  Continuous Blood Gluc Receiver (FREESTYLE LIBRE 14 DAY READER) DEVI Use as directed. Patient taking differently: 1 each by Other route as directed. . 04/18/20  Yes Sagardia, Ines Bloomer, MD  Continuous Blood Gluc Sensor (FREESTYLE LIBRE 14 DAY SENSOR) MISC Use as directed to check blood sugar daily Patient taking differently: 1 each by Other route See admin instructions.  Use as directed to check blood sugar daily 09/24/20  Yes [name], Melanie Crazier, MD  diltiazem (CARDIZEM) 30 MG tablet Take 30 mg by mouth See admin instructions. Take one tablet every 4 hours as needed for heart rate greater than 100 and blood pressure needs to be above 100   Yes Sherran Needs, NP  ELIQUIS 5 MG TABS tablet TAKE 1 TABLET(5 MG) BY MOUTH TWICE DAILY Patient taking differently: Take 5 mg by mouth 2 (two) times daily. 12/14/20  Yes [name], MD  HYDROcodone-acetaminophen (NORCO/VICODIN) 5-325 MG tablet Take 1 tablet by mouth every 6 (six) hours as needed. 05/06/21  Yes [provider]  Insulin Lispro Prot & Lispro (HUMALOG MIX 75/25 KWIKPEN) (75-25) 100 UNIT/ML Kwikpen Inject 15 Units into the skin 2 (two) times daily. 01/10/21  Yes [name], Melanie Crazier, MD  ketorolac (ACULAR) 0.5 % ophthalmic solution Place  1 drop into the right eye 2 (two) times daily. 04/10/21  Yes [provider]  levothyroxine (SYNTHROID) 75 MCG tablet Take 1 tablet (75 mcg total) by mouth daily before breakfast. 01/23/21 05/08/21 Yes Sagardia, Ines Bloomer, MD  loperamide (IMODIUM) 2 MG capsule Take 1 capsule (2 mg total) by mouth 4 (four) times daily as needed for diarrhea or loose stools. 03/20/20  Yes Garald Balding, PA-C  losartan (COZAAR) 100 MG tablet Take 1 tablet (100 mg total) by mouth daily. 05/31/20  Yes Sagardia, Ines Bloomer, MD  NOVOFINE PLUS PEN NEEDLE 32G X 4 MM MISC USE TWICE DAILY AS DIRECTED Patient taking differently: 1 each by Other route as directed. 04/08/21  Yes Sagardia, Ines Bloomer, MD  Olopatadine HCl 0.2 % SOLN Apply 1 drop to eye daily. 02/21/21  Yes [provider]  ONETOUCH VERIO test strip 1 each by Other route 3 (three) times daily. 06/08/20  Yes [provider]  OZEMPIC, 1 MG/DOSE, 4 MG/3ML SOPN INJECT 1MG INTO SKIN ONCE WEEKLY AS DIRECTED Patient taking differently: Inject 1 mg into the skin once a week. 04/25/21  Yes Sagardia, Ines Bloomer,  MD  potassium chloride (KLOR-CON) 10 MEQ tablet Take 2 tablets (20 mEq total) by mouth daily. 03/20/21  Yes Seiler, Amber K, NP  RESTASIS MULTIDOSE 0.05 % ophthalmic emulsion Place 1 drop into both eyes 2 (two) times daily.  03/04/20  Yes [provider]  rosuvastatin (CRESTOR) 10 MG tablet TAKE 1 TABLET BY MOUTH IN  THE EVENING Patient taking differently: Take 10 mg by mouth every evening. 04/25/21  Yes Sagardia, Ines Bloomer, MD  tiZANidine (ZANAFLEX) 4 MG tablet Take 1 tablet (4 mg total) by mouth every 8 (eight) hours as needed for muscle spasms. 08/29/20  Yes Mcarthur Rossetti, MD  torsemide (DEMADEX) 20 MG tablet TAKE 2 TABLETS(40 MG) BY MOUTH DAILY Patient taking differently: Take 40 mg by mouth daily. 11/07/20  Yes [name], MD  triamcinolone cream (KENALOG) 0.1 % Apply 1 application topically 2 (two) times daily. Patient taking differently: Apply 1 application topically as needed (skin). 07/26/20  Yes Sagardia, Ines Bloomer, MD  verapamil (CALAN) 120 MG tablet Take 1 tablet (120 mg total) by mouth 2 (two) times daily. 08/15/20  Yes [name], MD  lidocaine (LIDODERM) 5 % Place 1 patch onto the skin daily. Remove & Discard patch within 12 hours or as directed by MD Patient not taking: No sig reported 08/29/20   Mcarthur Rossetti, MD  Na Sulfate-K Sulfate-Mg Sulf (SUPREP BOWEL PREP KIT) 17.5-3.13-1.6 GM/177ML SOLN Take 1 kit by mouth as directed. 02/27/21   Milus Banister, MD    Allergies    Metformin  Review of Systems   Review of Systems  HENT:  Positive for dental problem.   Respiratory:  Negative for shortness of breath.   Cardiovascular:  Negative for chest pain.  Neurological:  Negative for light-headedness.  All other systems reviewed and are negative.  Physical Exam Updated Vital Signs BP (!) 171/68   Pulse 63   Temp 98.1 F (36.7 C) (Oral)   Resp 18   SpO2 95%   Physical Exam Vitals and nursing note reviewed.  Constitutional:       Appearance: She is well-developed. She is obese. She is not ill-appearing or diaphoretic.  HENT:     Head: Normocephalic and atraumatic.     Right Ear: External ear normal.     Left Ear: External ear normal.  Nose: Nose normal.     Mouth/Throat:     Mouth: No lacerations.   Eyes:     General:        Right eye: No discharge.        Left eye: No discharge.  Cardiovascular:     Rate and Rhythm: Regular rhythm. Bradycardia present.     Heart sounds: Normal heart sounds.  Pulmonary:     Effort: Pulmonary effort is normal.     Breath sounds: Normal breath sounds.  Abdominal:     General: There is no distension.     Palpations: Abdomen is soft.     Tenderness: There is no abdominal tenderness.  Skin:    General: Skin is warm and dry.  Neurological:     Mental Status: She is alert.  Psychiatric:        Mood and Affect: Mood is not anxious.    ED Results / Procedures / Treatments   Labs (all labs ordered are listed, but only abnormal results are displayed) Labs Reviewed  CBC - Abnormal; Notable for the following components:      Result Value   WBC 12.8 (*)    Hemoglobin 11.4 (*)    RDW 15.9 (*)    All other components within normal limits  TYPE AND SCREEN  ABO/RH    EKG None  Radiology No results found.  Procedures Procedures   Medications Ordered in ED Medications  tranexamic acid (CYKLOKAPRON) 1000 MG/10ML topical solution 500 mg (500 mg Topical Given 05/08/21 0812)  fentaNYL (SUBLIMAZE) injection 50 mcg (50 mcg Intravenous Given 05/08/21 0834)    ED Course  I have reviewed the triage vital signs and the nursing notes.  Pertinent labs & imaging results that were available during my care of the patient were reviewed by me and considered in my medical decision making (see chart for details).    MDM Rules/Calculators/A&P                           Multiple attempts were made to get the bleeding to stop.  TXA and just pressure did not seem to help.  Teabags  did not help.  A surgical foam was placed and this did seem to slow it down to an ooze.  It was stopped at 1 point but started to ooze again.  I tried to call her surgeon, Dr. Hoyt Koch, but no answer at this time.  Patient would like to be discharged and she wants to go straight to his clinic.  Given the bleeding has significantly slowed and I do not think she is at risk for hemorrhage at this point I think it is reasonable for her to be discharged with return precautions and following up with her oral surgeon. Final Clinical Impression(s) / ED Diagnoses Final diagnoses:  Hemorrhage of tooth socket    Rx / DC Orders ED Discharge Orders     None        [name], MD 05/08/21 1245

## 2021-05-11 DIAGNOSIS — Z5189 Encounter for other specified aftercare: Secondary | ICD-10-CM | POA: Diagnosis not present

## 2021-05-13 ENCOUNTER — Ambulatory Visit: Payer: Medicare Other

## 2021-05-13 NOTE — Progress Notes (Unsigned)
Chronic Care Management Pharmacy Note  05/13/2021 Name:  [name] MRN:  [id number] DOB:  1941-06-22  Summary: ***  Recommendations/Changes made from today's visit: ***  Plan: ***  Subjective: [name] is an 80 y.o. year old female who is a primary patient of Sagardia, Eilleen Kempf, MD.  The CCM team was consulted for assistance with disease management and care coordination needs.    Engaged with patient face to face for initial visit in response to provider referral for pharmacy case management and/or care coordination services.   Consent to Services:  The patient was given the following information about Chronic Care Management services today, agreed to services, and gave verbal consent: 1. CCM service includes personalized support from designated clinical staff supervised by the primary care provider, including individualized plan of care and coordination with other care providers 2. 24/7 contact phone numbers for assistance for urgent and routine care needs. 3. Service will only be billed when office clinical staff spend 20 minutes or more in a month to coordinate care. 4. Only one practitioner may furnish and bill the service in a calendar month. 5.The patient may stop CCM services at any time (effective at the end of the month) by phone call to the office staff. 6. The patient will be responsible for cost sharing (co-pay) of up to 20% of the service fee (after annual deductible is met). Patient agreed to services and consent obtained.  Patient Care Team: Georgina Quint, MD as PCP - General (Internal Medicine) Duke Salvia, MD as PCP - Electrophysiology (Cardiology) Sherren Mocha, MD as Resident (Family Medicine) [name] Corner, RN as Case Manager Xitlaly Ault, Vinnie Level, [facility] as Pharmacist (Pharmacist)  Recent office visits: 03/11/2021 - PCP visit - BP and diabetic check / dysuria - given 7 day course of Augmentin  02/28/2021 - PCP visit - dysuria - prescribed  cefuroxime x 7 days  01/23/2021 - PCP visit - HTN, DM - no changes to medications   Recent consult visits: 04/11/2021 [facility] Urology - for urinary pressure and frequent urination  04/05/2021 - Dr. Maple Hudson, Pulmonary Disease - OSA - counseled on appropriate use of CPAP machine  03/29/2021 - Dr. Eloy End - Podiatry - nail debridement  03/13/2021 - Dr. Magnus Ivan - Orthopedic Surgery- pain of right shoulder - noted full-thickness retracted rotator cuff tear - has found some success with PT, no additional surgery options at this time  03/12/2021 - Gypsy Balsam NP - cardiology - electrophysiology follow up - ekg complete - shows sinus rhythm - continue amiodarone, and eliquis - diltiazem prn for AF episodes  02/27/2021 - Dr. Christella Hartigan - Gastroenterology -chronic alternating constipation/ diarrhea - planned colonoscopy (scheduled for 06/13/2021) 01/30/2021 - Dr. Magnus Ivan - Orthopedic Surgery - MRI completed - showed rotator cuff tear -surgery not recommended patient advised to complete PT  01/10/2021 - Dr. Lonzo Cloud - Endocrinology - A1c 7.4% - using CGM - medications continued at this time   Hospital visits: 05/08/2021 - ED visit due to hemorrhage of tooth socket  - txa administered - teabags placed, surgical foam placed - bleeding was slowed but had continued - discharged at her request - following up with oral surgeon   Objective:  Lab Results  Component Value Date   CREATININE 0.74 03/12/2021   BUN 16 03/12/2021   GFR 71.14 01/23/2021   GFRNONAA 78 11/29/2020   GFRAA 90 11/29/2020   NA 141 03/12/2021   K 3.3 (L) 03/12/2021   CALCIUM 9.5 03/12/2021  CO2 31 (H) 03/12/2021   GLUCOSE 182 (H) 03/12/2021    Lab Results  Component Value Date/Time   HGBA1C 7.4 (A) 11/29/2020 10:33 AM   HGBA1C 7.7 (H) 09/12/2020 11:59 AM   HGBA1C 7.9 (A) 03/28/2020 11:39 AM   HGBA1C 9.5 (H) 10/03/2013 07:05 PM   GFR 71.14 01/23/2021 03:40 PM   GFR 80.75 01/03/2021 02:39 PM   MICROALBUR 6.3 (H) 06/21/2020 10:53 AM    MICROALBUR 2.48 (H) 08/21/2009 09:13 PM    Last diabetic Eye exam:  No results found for: HMDIABEYEEXA  Last diabetic Foot exam:  No results found for: HMDIABFOOTEX   Lab Results  Component Value Date   CHOL 135 09/12/2020   HDL 62 09/12/2020   LDLCALC 57 09/12/2020   TRIG 84 09/12/2020   CHOLHDL 2.2 09/12/2020    Hepatic Function Latest Ref Rng & Units 03/12/2021 01/23/2021 11/29/2020  Total Protein 6.0 - 8.5 g/dL 7.0 7.3 6.7  Albumin 3.7 - 4.7 g/dL 4.2 3.9 4.0  AST 0 - 40 IU/L 20 16 12   ALT 0 - 32 IU/L 17 13 10   Alk Phosphatase 44 - 121 IU/L 125(H) 110 121  Total Bilirubin 0.0 - 1.2 mg/dL 0.4 0.5 0.4  Bilirubin, Direct 0.00 - 0.40 mg/dL - - -    Lab Results  Component Value Date/Time   TSH 3.350 03/12/2021 11:52 AM   TSH 2.050 07/05/2020 10:07 AM   FREET4 1.43 02/21/2020 08:46 AM   FREET4 1.24 (H) 05/22/2016 09:45 AM    CBC Latest Ref Rng & Units 05/08/2021 03/12/2021 01/23/2021  WBC 4.0 - 10.5 K/uL 12.8(H) 12.9(H) 13.6(H)  Hemoglobin 12.0 - 15.0 g/dL 11.4(L) 12.0 11.9(L)  Hematocrit 36.0 - 46.0 % 37.5 38.2 37.4  Platelets 150 - 400 K/uL 324 357 351.0    Lab Results  Component Value Date/Time   VD25OH 21.09 (L) 01/25/2021 10:16 AM   VD25OH 23.37 (L) 01/23/2021 03:40 PM    Clinical ASCVD: {YES/NO:21197} The 10-year ASCVD risk score Denman George DC Jr., et al., 2013) is: 35.7%   Values used to calculate the score:     Age: 61 years     Sex: Female     Is Non-Hispanic African American: No     Diabetic: Yes     Tobacco smoker: No     Systolic Blood Pressure: 112 mmHg     Is BP treated: Yes     HDL Cholesterol: 62 mg/dL     Total Cholesterol: 135 mg/dL    Depression screen [facility] 2/9 03/25/2021 01/23/2021 01/09/2021  Decreased Interest 0 0 0  Down, Depressed, Hopeless 0 0 0  PHQ - 2 Score 0 0 0  Some recent data might be hidden     ***Other: (CHADS2VASc if Afib, MMRC or CAT for COPD, ACT, DEXA)  Social History   Tobacco Use  Smoking Status Never  Smokeless Tobacco  Never   BP Readings from Last 3 Encounters:  05/08/21 (!) 112/44  04/05/21 126/78  03/12/21 136/64   Pulse Readings from Last 3 Encounters:  05/08/21 61  04/05/21 64  03/12/21 68   Wt Readings from Last 3 Encounters:  04/05/21 253 lb (114.8 kg)  03/12/21 254 lb 6.4 oz (115.4 kg)  03/11/21 251 lb (113.9 kg)   BMI Readings from Last 3 Encounters:  04/05/21 47.80 kg/m  03/12/21 48.07 kg/m  03/11/21 47.43 kg/m    Assessment/Interventions: Review of patient past medical history, allergies, medications, health status, including review of consultants reports, laboratory and other test  data, was performed as part of comprehensive evaluation and provision of chronic care management services.   SDOH:  (Social Determinants of Health) assessments and interventions performed: {yes/no:20286}  SDOH Screenings   Alcohol Screen: Low Risk    Last Alcohol Screening Score (AUDIT): 0  Depression (PHQ2-9): Low Risk    PHQ-2 Score: 0  Financial Resource Strain: Not on file  Food Insecurity: No Food Insecurity   Worried About Programme researcher, broadcasting/film/video in the Last Year: Never true   Ran Out of Food in the Last Year: Never true  Housing: Low Risk    Last Housing Risk Score: 0  Physical Activity: Not on file  Social Connections: Not on file  Stress: Not on file  Tobacco Use: Low Risk    Smoking Tobacco Use: Never   Smokeless Tobacco Use: Never  Transportation Needs: No Transportation Needs   Lack of Transportation (Medical): No   Lack of Transportation (Non-Medical): No    CCM Care Plan  Allergies  Allergen Reactions   Metformin Diarrhea    Medications Reviewed Today     Reviewed by Hale Bogus, CPhT (Pharmacy Technician) on 05/08/21 at 1105  Med List Status: Complete   Medication Order Taking? Sig Documenting Provider Last Dose Status Informant  Accu-Chek Softclix Lancets lancets [id number] Yes 1 each by Other route 3 (three) times daily. as directed Janeece Agee, NP Past  Week Active Self           Med Note Vernard Gambles   Tue Mar 12, 2021 11:12 AM)    allopurinol (ZYLOPRIM) 300 MG tablet [id number] Yes TAKE 1 TABLET(300 MG) BY MOUTH DAILY  Patient taking differently: Take 300 mg by mouth daily.   Georgina Quint, MD 05/07/2021 Active Self  amiodarone (PACERONE) 200 MG tablet [id number] Yes Take 1 tablet (200 mg total) by mouth daily. Duke Salvia, MD 05/07/2021 Active Self  blood glucose meter kit and supplies [id number] Yes Dispense based on patient and insurance preference. Use up to four times daily as directed. (FOR ICD-10 E10.9, E11.9).  Patient taking differently: 1 each by Other route See admin instructions. Dispense based on patient and insurance preference. Use up to four times daily as directed. (FOR ICD-10 E10.9, E11.9).   Georgina Quint, MD Past Week Active Self           Med Note Hale Bogus   Wed May 08, 2021 10:51 AM)    cloNIDine (CATAPRES) 0.3 MG tablet [id number] Yes TAKE 1 TABLET(0.3 MG) BY MOUTH TWICE DAILY  Patient taking differently: Take 0.3 mg by mouth 2 (two) times daily.   Georgina Quint, MD 05/07/2021 Active Self  Continuous Blood Gluc Receiver (FREESTYLE LIBRE 14 DAY READER) DEVI [id number] Yes Use as directed.  Patient taking differently: 1 each by Other route as directed. Georgina Quint, MD Past Week Active Self           Med Note Ansel Bong May 08, 2021 10:51 AM)    Continuous Blood Gluc Sensor (FREESTYLE LIBRE 14 DAY SENSOR) [place] [id number] Yes Use as directed to check blood sugar daily  Patient taking differently: 1 each by Other route See admin instructions. Use as directed to check blood sugar daily   [name], Konrad Dolores, MD Past Week Active Self           Med Note Hale Bogus   Wed May 08, 2021 10:51 AM)    diltiazem (CARDIZEM)  30 MG tablet [id number] Yes Take 30 mg by mouth See admin instructions. Take one tablet every 4 hours as needed for heart  rate greater than 100 and blood pressure needs to be above 100 Newman Nip, NP Past Week Active Self           Med Note Hale Bogus   Wed May 08, 2021 10:51 AM)    ELIQUIS 5 MG TABS tablet [id number] Yes TAKE 1 TABLET(5 MG) BY MOUTH TWICE DAILY  Patient taking differently: Take 5 mg by mouth 2 (two) times daily.   Duke Salvia, MD 05/07/2021 Active Self  HYDROcodone-acetaminophen (NORCO/VICODIN) 5-325 MG tablet [id number] Yes Take 1 tablet by mouth every 6 (six) hours as needed. [provider] Past Week Active Self  Insulin Lispro Prot & Lispro (HUMALOG MIX 75/25 KWIKPEN) (75-25) 100 UNIT/ML Stephanie Coup [id number] Yes Inject 15 Units into the skin 2 (two) times daily. [name], Konrad Dolores, MD 05/07/2021 Active Self  ketorolac (ACULAR) 0.5 % ophthalmic solution [id number] Yes Place 1 drop into the right eye 2 (two) times daily. [provider] Past Week Active Self  levothyroxine (SYNTHROID) 75 MCG tablet [id number] Yes Take 1 tablet (75 mcg total) by mouth daily before breakfast. Georgina Quint, MD 05/07/2021 Active Self  lidocaine (LIDODERM) 5 % [id number] No Place 1 patch onto the skin daily. Remove & Discard patch within 12 hours or as directed by MD  Patient not taking: No sig reported   Kathryne Hitch, MD Not Taking Consider Medication Status and Discontinue Self           Med Note Wyvonnia Dusky, Emi Belfast   Wed May 08, 2021 10:52 AM)    loperamide (IMODIUM) 2 MG capsule [id number] Yes Take 1 capsule (2 mg total) by mouth 4 (four) times daily as needed for diarrhea or loose stools. Mare Ferrari, PA-C unk Active Self           Med Note Phebe Colla Jul 09, 2020 11:11 AM)    losartan (COZAAR) 100 MG tablet [id number] Yes Take 1 tablet (100 mg total) by mouth daily. Georgina Quint, MD 05/07/2021 Active Self  Na Sulfate-K Sulfate-Mg Sulf (SUPREP BOWEL PREP KIT) 17.5-3.13-1.6 GM/177ML SOLN [id number] No Take 1 kit by mouth as  directed. Rachael Fee, MD unk Active Self           Med Note Wyvonnia Dusky, Emi Belfast   Wed May 08, 2021 11:01 AM) Procedure scheduled for next month  NOVOFINE PLUS PEN NEEDLE 32G X 4 MM MISC [id number] Yes USE TWICE DAILY AS DIRECTED  Patient taking differently: 1 each by Other route as directed.   Georgina Quint, MD 05/07/2021 Active Self  Olopatadine HCl 0.2 % SOLN [id number] Yes Apply 1 drop to eye daily. [provider] Past Week Active Self  ONETOUCH VERIO test strip [id number] Yes 1 each by Other route 3 (three) times daily. [provider] Past Week Active Self           Med Note Hale Bogus   Wed May 08, 2021 10:53 AM)    OZEMPIC, 1 MG/DOSE, 4 MG/3ML SOPN [id number] Yes INJECT 1MG  INTO SKIN ONCE WEEKLY AS DIRECTED  Patient taking differently: Inject 1 mg into the skin once a week.   Georgina Quint, MD Past Month Active Self  potassium chloride (KLOR-CON) 10 MEQ tablet [id number] Yes Take 2 tablets (20 mEq total) by mouth daily. Marily Lente, NP Past  Week Active Self  RESTASIS MULTIDOSE 0.05 % ophthalmic emulsion [id number] Yes Place 1 drop into both eyes 2 (two) times daily.  [provider] Past Week Active Self  rosuvastatin (CRESTOR) 10 MG tablet [id number] Yes TAKE 1 TABLET BY MOUTH IN  THE EVENING  Patient taking differently: Take 10 mg by mouth every evening.   Georgina Quint, MD 05/07/2021 Active Self  tiZANidine (ZANAFLEX) 4 MG tablet [id number] Yes Take 1 tablet (4 mg total) by mouth every 8 (eight) hours as needed for muscle spasms. Kathryne Hitch, MD unk Active Self  torsemide (DEMADEX) 20 MG tablet [id number] Yes TAKE 2 TABLETS(40 MG) BY MOUTH DAILY  Patient taking differently: Take 40 mg by mouth daily.   Duke Salvia, MD 05/07/2021 Active Self  triamcinolone cream (KENALOG) 0.1 % [id number] Yes Apply 1 application topically 2 (two) times daily.  Patient taking differently: Apply 1 application topically as  needed (skin).   Georgina Quint, MD Past Week Active Self           Med Note Vernard Gambles   Tue Mar 12, 2021 11:12 AM)    verapamil (CALAN) 120 MG tablet [id number] Yes Take 1 tablet (120 mg total) by mouth 2 (two) times daily. Duke Salvia, MD 05/07/2021 Active Self           Med Note Marilu Favre Mar 25, 2021  1:46 PM) 03/25/21: Pt reports she was told by cardiology NP 03/12/21 to reduce dose to QD: note reviewed; NP documented to take "prn" Diltiazem: CCM pharmacy referral requested for review/ clarification/ message sent to cards NP             Patient Active Problem List   Diagnosis Date Noted   Dysthymia 03/12/2021   Dysuria 03/01/2021   Primary osteoarthritis involving multiple joints 03/01/2021   Recurrent falls 01/10/2021   Sensorineural hearing loss (SNHL), 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   Persistent atrial fibrillation (HCC) 08/12/2020   Type 2 diabetes mellitus with hyperglycemia, with long-term current use of insulin (HCC) 06/21/2020   Type 2 diabetes mellitus with diabetic polyneuropathy, with long-term current use of insulin (HCC) 06/21/2020   Mixed hyperlipidemia 06/21/2020   Uncontrolled hypertension 06/11/2020   Aortic atherosclerosis (HCC) 03/28/2020   Diabetic peripheral neuropathy (HCC) 12/26/2019   Intertrigo 10/23/2019   Gastroesophageal reflux disease without esophagitis 03/17/2019   Rash 03/17/2019   Bilateral leg edema 02/16/2019   Pruritus 02/16/2019   Varicose veins of both legs with edema 02/16/2019   Heart murmur 09/05/2018   Restrictive lung disease 02/22/2018   Dysgeusia 06/11/2017   Allergic dermatitis 06/03/2017   Osteoarthritis of left knee 06/03/2017   NAFLD (nonalcoholic fatty liver disease) 62/83/1517   Osteoporosis 12/31/2015   Obesity 12/19/2015   Hypersomnia 12/19/2015   Edema 10/18/2015   Acquired hypothyroidism 10/18/2015   Acute diastolic congestive heart failure, NYHA class 3 (HCC) 10/03/2013   Prolonged QT interval  10/03/2013   Acute diastolic heart failure (HCC) 10/03/2013   Long term (current) use of anticoagulants 06/09/2012   BENIGN NEOPLASM OF ADRENAL GLAND 11/25/2010   Chronic diastolic heart failure (HCC) 02/20/2010   DM 05/16/2009   GOUT 05/16/2009   OBESITY, MORBID 05/16/2009   Hypertension associated with diabetes (HCC) 05/16/2009   Paroxysmal atrial fibrillation (HCC) 05/16/2009   HYPERLIPIDEMIA 11/30/2008   Obstructive sleep apnea on CPAP 11/30/2008    Immunization History  Administered Date(s) Administered   Fluad Quad(high Dose 65+) 12/31/2015   Influenza Split 09/20/2011,  07/13/2012   Influenza, High Dose Seasonal PF 08/07/2016, 08/04/2017, 07/21/2018, 07/22/2019   Influenza,inj,Quad PF,6+ Mos 10/04/2013   Influenza-Unspecified 09/20/2011, 07/13/2012, 10/04/2013, 08/07/2016   Pneumococcal Conjugate-13 05/11/2014   Pneumococcal Polysaccharide-23 07/13/2012   Pneumococcal-Unspecified 07/15/2012   Zoster, Live 07/29/2012    Conditions to be addressed/monitored:  {USCCMDZASSESSMENTOPTIONS:23563}  There are no care plans that you recently modified to display for this patient.     Medication Assistance: {MEDASSISTANCEINFO:25044}  Compliance/Adherence/Medication fill history: Care Gaps: ***  Patient's preferred pharmacy is:  [facility] [phone] - [place], [place] - [address] [place] [address] Phone: [phone] Fax: [phone]  [id number] [place], [place] - [address] AT [facility] OF [facility] [address] [place] [address] Phone: [phone] Fax: [phone]  [facility]  [facility]) - [place], [place] - [address] [place] [address] Phone: [phone] Fax: [phone]  [facility] #[id number] [place], [place] - [address] AT [facility] OF [address] [place]  [place] [address] Phone: [phone] Fax: [phone]   Uses pill box? {Yes or If no, why not?:20788} Pt endorses ***% compliance  Care Plan and Follow Up Patient Decision:  {FOLLOWUP:24991}  Plan: {CM FOLLOW UP BPZW:25852}  ***  Current Barriers:  {pharmacybarriers:24917}  Pharmacist Clinical Goal(s):  Patient will {PHARMACYGOALCHOICES:24921} through collaboration with PharmD and provider.   Interventions: 1:1 collaboration with Georgina Quint, MD regarding development and update of comprehensive plan of care as evidenced by provider attestation and co-signature Inter-disciplinary care team collaboration (see longitudinal plan of care) Comprehensive medication review performed; medication list updated in electronic medical record  Hyperlipidemia: (LDL goal < 70) -{US controlled/uncontrolled:25276} -Last LDL 57 mg/dL (77/82/4235) -Current treatment: Rosuvastatin 10mg  daily  -Medications previously tried: ***  -Current dietary patterns: *** -Current exercise habits: *** -Educated on {CCM HLD Counseling:25126} -{CCMPHARMDINTERVENTION:25122}  Diabetes (A1c goal <7%) -{US controlled/uncontrolled:25276} -Last A1c 7.4% (11/29/2020) -Current medications: Humalog mix 75/25 - 15 units twice daily  Ozempic 1mg  weekly  -Medications previously tried: metformin, ***  -Current home glucose readings fasting glucose: *** post prandial glucose: *** -{ACTIONS;DENIES/REPORTS:21021675} hypoglycemic/hyperglycemic symptoms -Current meal patterns:  breakfast: ***  lunch: ***  dinner: *** snacks: *** drinks: *** -Current exercise: *** -Educated on {CCM DM COUNSELING:25123} -Counseled to check feet daily and get yearly eye exams -{CCMPHARMDINTERVENTION:25122}  Atrial Fibrillation (Goal: prevent stroke and major bleeding) -{US controlled/uncontrolled:25276} -CHADSVASC: *** -Current treatment: Rate control: Amiodarone 200mg  daily  Anticoagulation: Eliquis 5mg  twice daily   -Medications previously tried: *** -Home BP and HR readings: ***  -Counseled on {CCMAFIBCOUNSELING:25120} -{CCMPHARMDINTERVENTION:25122}  Heart Failure (Goal: manage symptoms and prevent exacerbations)/Hypertension (BP goal <140/90) -{US controlled/uncontrolled:25276} -Last ejection fraction: 60-65% (Date: 05/26/2016) -HF type: Diastolic -NYHA Class: III (marked limitation of activity) -Current treatment: Clonidine 0.3mg  - 1 tablet twice daily  Torsemide 20mg  - 2 tablets daily  Potassium Chloride - 2 tablets daily  Last Potassium level (3.3 mmol/L - 03/12/2021) Losartan 100mg  - 1 tablet daily  Verapamil 120mg  - 1 tablet twice daily  Diltiazem 30mg  - 1 tablet every 4 hours as needed  -Medications previously tried: ***  -Current home BP/HR readings: *** -Current dietary habits: *** -Current exercise habits: *** -Educated on {CCM HF Counseling:25125} -{CCMPHARMDINTERVENTION:25122}  Hypothyroidism (Goal: Maintenance of euthyroid levels) -{US controlled/uncontrolled:25276} -Last TSH level 3.350uIU/mL (03/12/2021) -Current treatment  Levothyroxine - 1  tablet daily  -Medications previously tried: ***  -{CCMPHARMDINTERVENTION:25122}  Gout (Goal: Prevention of gout attack / control of pain) -{US controlled/uncontrolled:25276} -Current treatment  Allopurinol 300mg  daily  -Medications previously tried: ***  -{CCMPHARMDINTERVENTION:25122}  Chronic pain of right shoulder (Goal: Pain control ) -{US controlled/uncontrolled:25276} -Current treatment  Tizanidine 4mg  - 1 tablet every 8 hours as needed  Lidocaine 5% patch - 1 patch daily (12 hours on/ 12 hours off) Hydrocodone/APAP 5-325mg  - 1 tablet every 6 hours as needed  -Medications previously tried: ***  -{CCMPHARMDINTERVENTION:25122}  Health Maintenance -Vaccine gaps: *** -Current therapy:  Loperamide 2mg  - 1 capsules 4 times daily as needed  Triamcinolone 0.1% cream - applied twice daily as needed   -Educated on  {ccm supplement counseling:25128} -{CCM Patient satisfied:25129} -{CCMPHARMDINTERVENTION:25122}   Patient Goals/Self-Care Activities Patient will:  - {pharmacypatientgoals:24919}  Follow Up Plan: {CM FOLLOW UP ONGE:95284}  Current Chart Prep = 38 minutes

## 2021-05-15 ENCOUNTER — Telehealth: Payer: Self-pay | Admitting: Internal Medicine

## 2021-05-15 ENCOUNTER — Ambulatory Visit: Payer: Medicare Other | Admitting: Internal Medicine

## 2021-05-15 NOTE — Telephone Encounter (Signed)
I called the pt and she stated that she has not been able to wear her CPAP due to having 3 dental surgeries.  She stated that this is all for 1 tooth.  She stated that she is still recovering and she is not able to eat or wear her glasses.  She stated that she looks like a monster.  I advised the pt that I would send this over to CY to make him aware.

## 2021-05-15 NOTE — Telephone Encounter (Signed)
Pt is returning phone call. Pls regard; [phone]

## 2021-05-15 NOTE — Telephone Encounter (Signed)
ATC x1, LVM to return call.

## 2021-05-15 NOTE — Progress Notes (Deleted)
Name: [name]  Age/ Sex: 80 y.o., female   MRN/ DOB: [id number], [birth year]     PCP: Georgina Quint, MD   Reason for Endocrinology Evaluation: Type 2 Diabetes Mellitus  Initial Endocrine Consultative Visit: 06/21/2020    PATIENT IDENTIFIER: [name] is a 80 y.o. female with a past medical history of HTN, PAF, CHF, OSA and Dyslipidemia . The patient has followed with Endocrinology clinic since 06/21/2020 for consultative assistance with management of her diabetes.  DIABETIC HISTORY:  [name] was diagnosed with DM in 2011,she is intolerant to metformin due to diarrhea. Her hemoglobin A1c has ranged from 7.3% in 2011, peaking at 9.5% in 2014.  SUBJECTIVE:   During the last visit (01/10/2021): A1c 7.4 %, adjusted insulin and restarted Ozempic   Today (05/15/2021): [name]  She checks her blood sugars multiple times a day  times daily, through CGM . The patient has not had hypoglycemic episodes since the last clinic visit.   Denies nausea or diarrhea She stopped having PT ~ 2.5 months ago She continues with right shoulder pain, expecting an MRI  HOME DIABETES REGIMEN:  Humalog Mix 15 units with Breakfast and Supper  Ozempic 0.5 mg weekly       Statin: yes ACE-I/ARB: yes    CONTINUOUS GLUCOSE MONITORING RECORD INTERPRETATION    Dates of Recording: 3/11-3/14/2022  Sensor description: [employer]   Results statistics:   CGM use % of time 97  Average and SD 155/19.4  Time in range    81 %  % Time Above 180 18  % Time above 250 1  % Time Below target 0     Glycemic patterns summary: optimal BG's overnight slight hyperglycemia after supper Hyperglycemic episodes  Post lunch and supper   Hypoglycemic episodes occurred N/A  Overnight periods: Optimal           DIABETIC COMPLICATIONS: Microvascular complications:  Neuropathy Denies: CKD, , retinopathy Last eye exam: Completed 03/2020   Macrovascular complications:     Denies: CAD, PVD, CVA    HISTORY:  Past Medical History:  Past Medical History:  Diagnosis Date   Arthritis    Back pain    Chronic anticoagulation    due to aflutter   Chronic kidney disease    Diabetes mellitus    Diastolic CHF, chronic (HCC)    a.  echo 2006 - ef 55-65%; mild diast dysfxn;    b. Echo 08/2011: Mild LVH, EF 60%;  c. 04/2013 Echo: EF 65-69%, mild conc LVH;  08/2014 Echo: EF 60-65%, mild-mod MR.   Gout    Hyperlipidemia    Hypertension    a.  Renal arterial Dopplers 12/2011: 1-59% right renal artery stenosis   Morbid obesity (HCC)    Obstructive sleep apnea on CPAP    Paroxysmal Afib/Flutter    a. 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   Past Surgical History:  Past Surgical History:  Procedure Laterality Date   APPENDECTOMY     ATRIAL FLUTTER ABLATION N/A 09/24/2011   Procedure: ATRIAL FLUTTER ABLATION;  Surgeon: Marinus Maw, MD;  Location: [facility] CATH LAB;  Service: Cardiovascular;  Laterality: N/A;   CARDIOVERSION  10/22/2011   Procedure: CARDIOVERSION;  Surgeon: Duke Salvia, MD;  Location[facility] OR;  Service: Cardiovascular;  Laterality: N/A;   CARDIOVERSION N/A 09/10/2011   Procedure: CARDIOVERSION;  Surgeon: Duke Salvia, MD;  Location: [facility] CATH LAB;  Service: Cardiovascular;  Laterality: N/A;   CHOLECYSTECTOMY     TONSILLECTOMY  1982   TOTAL ABDOMINAL HYSTERECTOMY     Social History:  reports that she has never smoked. She has never used smokeless tobacco. She reports that she does not drink alcohol and does not use drugs. Family History:  Family History  Problem Relation Age of Onset   Heart disease Father    Hypertension Father    Breast cancer Sister    Cancer Sister        breast   Colon cancer Neg Hx    Esophageal cancer Neg Hx    Pancreatic cancer Neg Hx    Liver disease Neg Hx      HOME MEDICATIONS: Allergies as of 05/15/2021       Reactions   Metformin Diarrhea        Medication List        Accurate as of May 15, 2021   7:22 AM. If you have any questions, ask your nurse or doctor.          Accu-Chek Softclix Lancets lancets 1 each by Other route 3 (three) times daily. as directed   allopurinol 300 MG tablet Commonly known as: ZYLOPRIM TAKE 1 TABLET(300 MG) BY MOUTH DAILY What changed: See the new instructions.   amiodarone 200 MG tablet Commonly known as: PACERONE Take 1 tablet (200 mg total) by mouth daily.   blood glucose meter kit and supplies Dispense based on patient and insurance preference. Use up to four times daily as directed. (FOR ICD-10 E10.9, E11.9). What changed:  how much to take how to take this when to take this   cloNIDine 0.3 MG tablet Commonly known as: CATAPRES TAKE 1 TABLET(0.3 MG) BY MOUTH TWICE DAILY What changed: See the new instructions.   diltiazem 30 MG tablet Commonly known as: CARDIZEM Take 30 mg by mouth See admin instructions. Take one tablet every 4 hours as needed for heart rate greater than 100 and blood pressure needs to be above 100   Eliquis 5 MG Tabs tablet Generic drug: apixaban TAKE 1 TABLET(5 MG) BY MOUTH TWICE DAILY What changed: See the new instructions.   FreeStyle Libre 14 Day Reader Hardie Pulley Use as directed. What changed:  how much to take how to take this when to take this additional instructions   FreeStyle Libre 14 Day Sensor Misc Use as directed to check blood sugar daily What changed:  how much to take how to take this when to take this   HYDROcodone-acetaminophen 5-325 MG tablet Commonly known as: NORCO/VICODIN Take 1 tablet by mouth every 6 (six) hours as needed.   Insulin Lispro Prot & Lispro (75-25) 100 UNIT/ML Kwikpen Commonly known as: HumaLOG Mix 75/25 KwikPen Inject 15 Units into the skin 2 (two) times daily.   ketorolac 0.5 % ophthalmic solution Commonly known as: ACULAR Place 1 drop into the right eye 2 (two) times daily.   levothyroxine 75 MCG tablet Commonly known as: SYNTHROID Take 1 tablet (75 mcg total)  by mouth daily before breakfast.   lidocaine 5 % Commonly known as: Lidoderm Place 1 patch onto the skin daily. Remove & Discard patch within 12 hours or as directed by MD   loperamide 2 MG capsule Commonly known as: IMODIUM Take 1 capsule (2 mg total) by mouth 4 (four) times daily as needed for diarrhea or loose stools.   losartan 100 MG tablet Commonly known as: COZAAR Take 1 tablet (100 mg total) by mouth daily.   NovoFine Plus Pen Needle 32G X 4 MM Misc Generic drug:  Insulin Pen Needle USE TWICE DAILY AS DIRECTED What changed:  how much to take how to take this when to take this additional instructions   Olopatadine HCl 0.2 % Soln Apply 1 drop to eye daily.   OneTouch Verio test strip Generic drug: glucose blood 1 each by Other route 3 (three) times daily.   Ozempic (1 MG/DOSE) 4 MG/3ML Sopn Generic drug: Semaglutide (1 MG/DOSE) INJECT 1MG  INTO SKIN ONCE WEEKLY AS DIRECTED What changed: See the new instructions.   potassium chloride 10 MEQ tablet Commonly known as: KLOR-CON Take 2 tablets (20 mEq total) by mouth daily.   Restasis MultiDose 0.05 % ophthalmic emulsion Generic drug: cycloSPORINE Place 1 drop into both eyes 2 (two) times daily.   rosuvastatin 10 MG tablet Commonly known as: CRESTOR TAKE 1 TABLET BY MOUTH IN  THE EVENING   Suprep Bowel Prep Kit 17.5-3.13-1.6 GM/177ML Soln Generic drug: Na Sulfate-K Sulfate-Mg Sulf Take 1 kit by mouth as directed.   tiZANidine 4 MG tablet Commonly known as: Zanaflex Take 1 tablet (4 mg total) by mouth every 8 (eight) hours as needed for muscle spasms.   torsemide 20 MG tablet Commonly known as: DEMADEX TAKE 2 TABLETS(40 MG) BY MOUTH DAILY What changed: See the new instructions.   triamcinolone cream 0.1 % Commonly known as: KENALOG Apply 1 application topically 2 (two) times daily. What changed:  when to take this reasons to take this   verapamil 120 MG tablet Commonly known as: Calan Take 1 tablet  (120 mg total) by mouth 2 (two) times daily.         OBJECTIVE:   Vital Signs: There were no vitals taken for this visit.  Wt Readings from Last 3 Encounters:  04/05/21 253 lb (114.8 kg)  03/12/21 254 lb 6.4 oz (115.4 kg)  03/11/21 251 lb (113.9 kg)     Exam: General: Pt appears well and is in NAD  Lungs: Clear with good BS bilat with no rales, rhonchi, or wheezes  Heart: RRR   Extremities: No pretibial edema.   Neuro: MS is good with appropriate affect, pt is alert and Ox3    DM foot exam: 9/2/221     The skin of the feet is intact without sores or ulcerations. The pedal pulses are 2+ on right and 2+ on left. The sensation is intact to a screening 5.07, 10 gram monofilament bilaterally         DATA REVIEWED:  Lab Results  Component Value Date   HGBA1C 7.4 (A) 11/29/2020   HGBA1C 7.7 (H) 09/12/2020   HGBA1C 7.9 (A) 03/28/2020    Results for DOAN, DUNKER (MRN [id number]) as of 05/15/2021 07:23  Ref. Range 01/23/2021 15:40 01/25/2021 10:16  VITD Latest Ref Range: 30.00 - 100.00 ng/mL 23.37 (L) 21.09 (L)  Vitamin B12 Latest Ref Range: 211 - 911 pg/mL 328 327   ASSESSMENT / PLAN / RECOMMENDATIONS:   1) Type 2 Diabetes Mellitus, Sub-Optimally controlled, With Neuropathic  complications - Most recent A1c of 7.4 %. Goal A1c < 7.0 %.    - I have praised her on the improved glycemic control. 81 % of her readings are at goal   - Intolerant to Metformin  - NO changes today   MEDICATIONS: - Continue Humalog Mix 15 units with Breakfast and 15 units with Supper  - Continue Ozempic 0.5 mg weekly    EDUCATION / INSTRUCTIONS: BG monitoring instructions: Patient is instructed to check her blood sugars 3 times a day, before meals .  Call [facility] if: BG persistently < 70 I reviewed the Rule of 15 for the treatment of hypoglycemia in detail with the patient. Literature supplied.    2) Diabetic complications:  Eye: Does not have known diabetic  retinopathy.  Neuro/ Feet: Does have known diabetic peripheral neuropathy .  Renal: Patient does not have known baseline CKD. She   is on an ACEI/ARB at present.  3) Mixed Hyperlipidemia. : Patient is  On rosuvastatin 10 mg daily  , LDL and Tg at goal      4) Recurrent falls :  - She is not on any vitamins, I will check Vitamin D and B12  - No phlebotomist today, she will return for labs     F/U in 4 months    Signed electronically by: Lyndle Herrlich, MD  [facility] Endocrinology  [facility] [address] [place] [place], [place] [address] Phone: [phone] FAX: [phone]   CC: Georgina Quint, MD [address] [place] [address] Phone: [phone]  Fax: [phone]  Return to Endocrinology clinic as below: Future Appointments  Date Time Provider Department Center  05/15/2021 10:10 AM Paulanthony Gleaves, Konrad Dolores, MD [facility] None  05/16/2021  3:30 PM [facility] CCM PHARMACIST 2 [facility] None  05/17/2021  3:30 PM [facility] CARE [facility] None  05/21/2021  1:20 PM Georgina Quint, MD [facility] None  05/30/2021  9:30 AM Newman Nip, NP [facility] None  07/05/2021 10:00 AM Freddie Breech, DPM [facility]  10/07/2021  9:30 AM Waymon Budge, MD [facility] None

## 2021-05-16 ENCOUNTER — Telehealth: Payer: Medicare Other

## 2021-05-17 ENCOUNTER — Ambulatory Visit (INDEPENDENT_AMBULATORY_CARE_PROVIDER_SITE_OTHER): Payer: Medicare Other | Admitting: *Deleted

## 2021-05-17 DIAGNOSIS — E1142 Type 2 diabetes mellitus with diabetic polyneuropathy: Secondary | ICD-10-CM | POA: Diagnosis not present

## 2021-05-17 DIAGNOSIS — Z794 Long term (current) use of insulin: Secondary | ICD-10-CM | POA: Diagnosis not present

## 2021-05-17 DIAGNOSIS — I5032 Chronic diastolic (congestive) heart failure: Secondary | ICD-10-CM | POA: Diagnosis not present

## 2021-05-17 NOTE — Patient Instructions (Signed)
Visit Information  [name], it was nice talking with you today.   Please read over the attached information, and keep up the great work monitoring/ recording your blood sugars, daily weights, and weekly blood pressures at home: keep writing these on paper so we can review during our phone call appointments.   I look forward to talking to you again for an update on Monday August 12, 2021 at 3:30 pm - please be listening out for my call that day.  I will call as close to 3:30 pm as possible.  If you need to cancel or re-schedule our telephone visit, please call [phone] and one of our care guides will be happy to assist you.  I look forward to hearing about your progress.   Please don't hesitate to contact me if I can be of assistance to you before our next scheduled appointment.   [name], RN, BSN, [facility] RN Care Coordination- [place] [phone]: direct office [phone]: mobile    PATIENT GOALS:  Goals Addressed             This Visit's Progress    Monitor and Manage My Blood Sugar-Diabetes Type 2   On track    Timeframe:  Long-Range Goal Priority:  Medium Start Date:     03/25/21                        Expected End Date:      03/25/22                 Follow Up Date 08/12/21   Keep up the great work monitoring and writing down your blood sugars at home: first thing in the morning before eating and then again 2 hours after a meal: this will help your doctors know if your medications are working or if they need adjusting: we will review these values each time we talk over the phone  Check blood sugar if I feel it is too high or too low: if you have questions about whether your continuous Free-Style Elenor Legato is accurate, continue to compare the finger-stick glucometer values-- if this is an ongoing concern- please talk to your diabetes doctor (Dr. Kelton Pillar) about this during your next scheduled office visit on May 24, 2021 Take  your recorded blood sugars from home to all doctor visits  Continue keeping a close eye on your feet and contact your diabetes doctor or your primary care doctor if you develop concerns about the appearance of your feet Try to stay as active as possible Keep trying to follow a low carbohydrate and low sugar diet Please continue reading over the booklet "Living Well with Diabetes"   Why is this important?   Checking your blood sugar at home helps to keep it from getting very high or very low.  Writing the results in a diary or log helps the doctor know how to care for you.  Your blood sugar log should have the time, date and the results.  Also, write down the amount of insulin or other medicine that you take.  Other information, like what you ate, exercise done and how you were feeling, will also be helpful.          Track and Manage Fluids and Swelling-Heart Failure   On track    Timeframe:  Long-Range Goal Priority:  Medium Start Date:   03/25/21  Expected End Date:       03/25/22                Follow Up Date 08/12/21   [name] job weighing daily and writing it down on paper Call cardiology office if I gain more than 2 pounds in one day or 5 pounds in one week Do ankle pumps throughout the day when sitting Keep legs up while sitting Keep up following a "No Added Salt diet" and continue watching the food labels for salt content with the foods you eat and prepare  Watch for swelling in feet, ankles and legs every day; I am glad to hear that the swelling you reported earlier this summer is better; if the swelling returns, please contact your cardiology provider asap and make an appointment Wear the compression stockings that the cardiology staff recommended on days that you notice swelling in your feet/ ankles Please listen out for the Pharmacy team from Dr. Barry Brunner office to call you-- they will help you work with your doctors to manage and optimize your  medications   Why is this important?   It is important to check your weight daily and watch how much salt and liquids you have.  It will help you to manage your heart failure.           The patient verbalized understanding of instructions, educational materials, and care plan provided today and agreed to receive a mailed copy of patient instructions, educational materials, and care plan.  Telephone follow up appointment with care management team member scheduled for:  Monday August 12, 2021 at 3:30 pm The patient has been provided with contact information for the care management team and has been advised to call with any health related questions or concerns.   [name], RN, BSN, [facility] RN Care Coordination- [place] [phone]: direct office [phone]: mobile

## 2021-05-17 NOTE — Chronic Care Management (AMB) (Signed)
Chronic Care Management   CCM RN Visit Note  05/17/2021 Name: [name] MRN: [id number] DOB: 1940/12/22  Subjective: [name] is a 80 y.o. year old female who is a primary care patient of Sagardia, Ines Bloomer, MD. The care management team was consulted for assistance with disease management and care coordination needs.    Engaged with patient by telephone for follow up visit in response to provider referral for case management and/or care coordination services.   Consent to Services:  The patient was given information about Chronic Care Management services, agreed to services, and gave verbal consent prior to initiation of services.  Please see initial visit note for detailed documentation.  Patient agreed to services and verbal consent obtained.   Assessment: Review of patient past medical history, allergies, medications, health status, including review of consultants reports, laboratory and other test data, was performed as part of comprehensive evaluation and provision of chronic care management services.   CCM Care Plan  Allergies  Allergen Reactions   Metformin Diarrhea   Outpatient Encounter Medications as of 05/17/2021  Medication Sig Note   Accu-Chek Softclix Lancets lancets 1 each by Other route 3 (three) times daily. as directed    allopurinol (ZYLOPRIM) 300 MG tablet TAKE 1 TABLET(300 MG) BY MOUTH DAILY (Patient taking differently: Take 300 mg by mouth daily.)    amiodarone (PACERONE) 200 MG tablet Take 1 tablet (200 mg total) by mouth daily.    blood glucose meter kit and supplies Dispense based on patient and insurance preference. Use up to four times daily as directed. (FOR ICD-10 E10.9, E11.9). (Patient taking differently: 1 each by Other route See admin instructions. Dispense based on patient and insurance preference. Use up to four times daily as directed. (FOR ICD-10 E10.9, E11.9).)    cloNIDine (CATAPRES) 0.3 MG tablet TAKE 1 TABLET(0.3 MG) BY MOUTH TWICE  DAILY (Patient taking differently: Take 0.3 mg by mouth 2 (two) times daily.)    Continuous Blood Gluc Receiver (FREESTYLE LIBRE 14 DAY READER) DEVI Use as directed. (Patient taking differently: 1 each by Other route as directed. Marland Kitchen)    Continuous Blood Gluc Sensor (FREESTYLE LIBRE 14 DAY SENSOR) MISC Use as directed to check blood sugar daily (Patient taking differently: 1 each by Other route See admin instructions. Use as directed to check blood sugar daily)    diltiazem (CARDIZEM) 30 MG tablet Take 30 mg by mouth See admin instructions. Take one tablet every 4 hours as needed for heart rate greater than 100 and blood pressure needs to be above 100    ELIQUIS 5 MG TABS tablet TAKE 1 TABLET(5 MG) BY MOUTH TWICE DAILY (Patient taking differently: Take 5 mg by mouth 2 (two) times daily.)    HYDROcodone-acetaminophen (NORCO/VICODIN) 5-325 MG tablet Take 1 tablet by mouth every 6 (six) hours as needed.    Insulin Lispro Prot & Lispro (HUMALOG MIX 75/25 KWIKPEN) (75-25) 100 UNIT/ML Kwikpen Inject 15 Units into the skin 2 (two) times daily.    ketorolac (ACULAR) 0.5 % ophthalmic solution Place 1 drop into the right eye 2 (two) times daily.    levothyroxine (SYNTHROID) 75 MCG tablet Take 1 tablet (75 mcg total) by mouth daily before breakfast.    lidocaine (LIDODERM) 5 % Place 1 patch onto the skin daily. Remove & Discard patch within 12 hours or as directed by MD (Patient not taking: No sig reported)    loperamide (IMODIUM) 2 MG capsule Take 1 capsule (2 mg total) by mouth 4 (four)  times daily as needed for diarrhea or loose stools.    losartan (COZAAR) 100 MG tablet Take 1 tablet (100 mg total) by mouth daily.    Na Sulfate-K Sulfate-Mg Sulf (SUPREP BOWEL PREP KIT) 17.5-3.13-1.6 GM/177ML SOLN Take 1 kit by mouth as directed. 05/08/2021: Procedure scheduled for next month   NOVOFINE PLUS PEN NEEDLE 32G X 4 MM MISC USE TWICE DAILY AS DIRECTED (Patient taking differently: 1 each by Other route as directed.)     Olopatadine HCl 0.2 % SOLN Apply 1 drop to eye daily.    ONETOUCH VERIO test strip 1 each by Other route 3 (three) times daily.    OZEMPIC, 1 MG/DOSE, 4 MG/3ML SOPN INJECT 1MG INTO SKIN ONCE WEEKLY AS DIRECTED (Patient taking differently: Inject 1 mg into the skin once a week.)    potassium chloride (KLOR-CON) 10 MEQ tablet Take 2 tablets (20 mEq total) by mouth daily.    RESTASIS MULTIDOSE 0.05 % ophthalmic emulsion Place 1 drop into both eyes 2 (two) times daily.     rosuvastatin (CRESTOR) 10 MG tablet TAKE 1 TABLET BY MOUTH IN  THE EVENING (Patient taking differently: Take 10 mg by mouth every evening.)    tiZANidine (ZANAFLEX) 4 MG tablet Take 1 tablet (4 mg total) by mouth every 8 (eight) hours as needed for muscle spasms.    torsemide (DEMADEX) 20 MG tablet TAKE 2 TABLETS(40 MG) BY MOUTH DAILY (Patient taking differently: Take 40 mg by mouth daily.)    triamcinolone cream (KENALOG) 0.1 % Apply 1 application topically 2 (two) times daily. (Patient taking differently: Apply 1 application topically as needed (skin).)    verapamil (CALAN) 120 MG tablet Take 1 tablet (120 mg total) by mouth 2 (two) times daily. 03/25/2021: 03/25/21: Pt reports she was told by cardiology NP 03/12/21 to reduce dose to QD: note reviewed; NP documented to take "prn" Diltiazem: CCM pharmacy referral requested for review/ clarification/ message sent to cards NP    No facility-administered encounter medications on file as of 05/17/2021.   Patient Active Problem List   Diagnosis Date Noted   Dysthymia 03/12/2021   Dysuria 03/01/2021   Primary osteoarthritis involving multiple joints 03/01/2021   Recurrent falls 01/10/2021   Sensorineural hearing loss (SNHL), 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   Persistent atrial fibrillation ([place]) 08/12/2020   Type 2 diabetes mellitus with hyperglycemia, with long-term current use of insulin ([place]) 06/21/2020   Type 2 diabetes mellitus with diabetic polyneuropathy, with long-term current use of insulin  ([place]) 06/21/2020   Mixed hyperlipidemia 06/21/2020   Uncontrolled hypertension 06/11/2020   Aortic atherosclerosis ([place]) 03/28/2020   Diabetic peripheral neuropathy ([place]) 12/26/2019   Intertrigo 10/23/2019   Gastroesophageal reflux disease without esophagitis 03/17/2019   Rash 03/17/2019   Bilateral leg edema 02/16/2019   Pruritus 02/16/2019   Varicose veins of both legs with edema 02/16/2019   Heart murmur 09/05/2018   Restrictive lung disease 02/22/2018   Dysgeusia 06/11/2017   Allergic dermatitis 06/03/2017   Osteoarthritis of left knee 06/03/2017   NAFLD (nonalcoholic fatty liver disease) 08/07/2016   Osteoporosis 12/31/2015   Obesity 12/19/2015   Hypersomnia 12/19/2015   Edema 10/18/2015   Acquired hypothyroidism 16/07/9603   Acute diastolic congestive heart failure, NYHA class 3 ([place]) 10/03/2013   Prolonged QT interval 54/06/8118   Acute diastolic heart failure ([place]) 10/03/2013   Long term (current) use of anticoagulants 06/09/2012   BENIGN NEOPLASM OF ADRENAL GLAND 11/25/2010   Chronic diastolic heart failure ([place]) 02/20/2010   DM 05/16/2009  GOUT 05/16/2009   OBESITY, MORBID 05/16/2009   Hypertension associated with diabetes ([place]) 05/16/2009   Paroxysmal atrial fibrillation ([place]) 05/16/2009   HYPERLIPIDEMIA 11/30/2008   Obstructive sleep apnea on CPAP 11/30/2008   Conditions to be addressed/monitored:  CHF and DMII  Care Plan : Heart Failure (Adult)  Updates made by Knox Royalty, RN since 05/17/2021 12:00 AM     Problem: Symptom Exacerbation (Heart Failure)   Priority: Medium     Long-Range Goal: Symptom Exacerbation Prevented or Minimized   Start Date: 03/25/2021  Expected End Date: 03/25/2022  This Visit's Progress: On track  Recent Progress: On track  Priority: Medium  Note:   Current Barriers:  Knowledge deficit related to basic heart failure pathophysiology and self care management- has general baseline knowledge; will require ongoing reinforcement of  same Knowledge Deficits related to heart failure medications- needs thorough review/ optimization of medications: CCM Pharmacy referral placed Does not adhere to provider recommendations re: daily weight monitoring/ recording: agreeable to resume this practice Case Manager Clinical Goal(s):  Over the next 12 months, patient will complete the following as evidenced by patient reporting during CCM RN CM outreach of: verbalize understanding of Heart Failure Action Plan and when to call doctor take all Heart Failure mediations as prescribed monitor/ record daily weights at home (notifying MD of 3 lb weight gain over night or 5 lb in a week) Interventions:  Collaboration with Horald Pollen, MD regarding development and update of comprehensive plan of care as evidenced by provider attestation and co-signature Inter-disciplinary care team collaboration (see longitudinal plan of care) Chart reviewed including relevant office notes, upcoming scheduled appointments, and lab results Discussed current clinical condition with patient who reports today improvement in LE swelling that she reported earlier in summer; stated this is much better, although she is unsure why Confirmed she continues to monitor/ record daily weights at home: reports weights consistently 250-255 lbs, with a weight this morning of 249 lbs Confirmed patient continues to follow low sodium heart healthy diet Reinforced previously provided education around signs/ symptoms yellow CHF zone, along with corresponding action plan: she reports doing "fine" today and denies concerns around breathing status Reviewed upcoming provider appointments with patient and confirmed that patient has plans to attend all as scheduled: 05/20/21: [place] team; 05/21/21: PCP; 05/24/21: endocrinology; 05/30/21: [facility]; 07/05/21: podiatry Self-Care Activities: Patient verbalizes understanding of plan to resume daily weight monitoring and recording Self  administers medications as prescribed-- occasionally reduces diuretic dose when she has to leave home: reports excessive urination with currently prescribed diuretic to the point that she is unable to leave home Attends all scheduled provider appointments Calls pharmacy for medication refills Attends church or other social activities Performs ADL's/ iADL's independently Calls provider office for new concerns or questions Patient Goals: [name] job weighing daily and writing it down on paper Call cardiology office if I gain more than 2 pounds in one day or 5 pounds in one week Do ankle pumps throughout the day when sitting Keep legs up while sitting Keep up following a "No Added Salt diet" and continue watching the food labels for salt content with the foods you eat and prepare  Watch for swelling in feet, ankles and legs every day; I am glad to hear that the swelling you reported earlier this summer is better; if the swelling returns, please contact your cardiology provider asap and make an appointment Wear the compression stockings that the cardiology staff recommended on days that you notice  swelling in your feet/ ankles Please listen out for the Pharmacy team from Dr. Barry Brunner office to call you-- they will help you work with your doctors to manage and optimize your medications Follow Up Plan:  Telephone follow up appointment with care management team member scheduled for: Monday August 12, 2021 at 3:30 pm The patient has been provided with contact information for the care management team and has been advised to call with any health related questions or concerns.      Care Plan : Diabetes Type 2 (Adult)  Updates made by Knox Royalty, RN since 05/17/2021 12:00 AM     Problem: Glycemic Management (Diabetes, Type 2)   Priority: Medium     Long-Range Goal: Glycemic Management Optimized   Start Date: 03/25/2021  Expected End Date: 03/25/2022  This Visit's Progress: On track  Recent  Progress: On track  Priority: Medium  Note:   Objective:  Lab Results  Component Value Date   HGBA1C 7.4 (A) 11/29/2020   Lab Results  Component Value Date   CREATININE 0.74 03/12/2021   CREATININE 0.80 01/23/2021   CREATININE 0.72 01/03/2021   Lab Results  Component Value Date   EGFR 83 03/12/2021   Current Barriers:  Knowledge Deficits related to basic Diabetes pathophysiology and self care/management- has fair baseline understanding, could benefit from ongoing reinforcement of same Does not know how to utilize history function of CGM- will require ongoing reinforcement of same  Case Manager Clinical Goal(s):  Over the next 12 months, patient will demonstrate improved adherence to prescribed treatment plan for diabetes self care/management as evidenced by patient reporting during CCM RN CM outreach of:   daily monitoring and recording of blood sugars at home using CGM and glucometer, as indicated   adherence to ADA/ carb modified/ low sugar diet  adherence to prescribed medication regimen Interventions:  Collaboration with Horald Pollen, MD regarding development and update of comprehensive plan of care as evidenced by provider attestation and co-signature Inter-disciplinary care team collaboration (see longitudinal plan of care) Review of patient status, including review of consultants reports, relevant laboratory and other test results, and medications completed Chart reviewed including relevant office notes, upcoming scheduled appointments, and lab results: last A1-C noted: 7.4 (11/29/20) Discussed current clinical condition with patient: reports recent significant complication from bleeding after oral surgery; states she is finally "getting better;" denies additional concerns Reviewed recent blood sugars at home: she is in process of moving and for that reason is not using FSL CGM-- now using finger stick/ glucometer; patient reports recent increase in blood sugar that she  attributes to stress from oral surgery complications: reports she has had elevated blood sugars for about "2 weeks;" reports blood sugar ranges between 200-424 Reports post-prandial blood sugar earlier today of "225;" states that she just re-checked it and it is now "175" Confirms no recent low blood sugars; states blood sugars were fine when she travelled out [place] earlier this summer Confirms that she has scheduled provider office visits 05/21/21 with PCP; 05/24/21 with endocrinologist- verbalizes plans to attend all as scheduled Discussed plans with patient for ongoing care management follow up and provided patient with direct contact information for care management team- reminded patient of CCM pharmacy call scheduled for 05/20/21- she verbalizes understanding Self-Care Activities UNABLE to independently check historical values using CGM/ FSL: will require ongoing reinforcement Self administers insulin and injectable DM medicationas (Ozempic) prescribed Attends all scheduled provider appointments Checks blood sugars as prescribed and compares CGM to glucometer  values when indicated Attempts to adhere to prescribed ADA/carb modified/ low sugar diet: could benefit from ongoing reinforcement of dietary management of DM Patient Goals: Keep up the great work monitoring and writing down your blood sugars at home: first thing in the morning before eating and then again 2 hours after a meal: this will help your doctors know if your medications are working or if they need adjusting: we will review these values each time we talk over the phone  Check blood sugar if I feel it is too high or too low: if you have questions about whether your continuous Free-Style Elenor Legato is accurate, continue to compare the finger-stick glucometer values-- if this is an ongoing concern- please talk to your diabetes doctor (Dr. Kelton Pillar) about this during your next scheduled office visit on May 24, 2021 Take your recorded blood  sugars from home to all doctor visits  Continue keeping a close eye on your feet and contact your diabetes doctor or your primary care doctor if you develop concerns about the appearance of your feet Try to stay as active as possible Keep trying to follow a low carbohydrate and low sugar diet Please continue reading over the booklet "Living Well with Diabetes" Follow Up Plan:  Telephone follow up appointment with care management team member scheduled for: Monday, August 12, 2021 at 3:30 pm The patient has been provided with contact information for the care management team and has been advised to call with any health related questions or concerns.      Plan: Telephone follow up appointment with care management team member scheduled for:  Monday August 12, 2021 at 3:30 pm The patient has been provided with contact information for the care management team and has been advised to call with any health related questions or concerns.   [name], RN, BSN, [facility] RN Care Coordination- [place] [phone]: direct office [phone]: mobile

## 2021-05-20 ENCOUNTER — Ambulatory Visit (INDEPENDENT_AMBULATORY_CARE_PROVIDER_SITE_OTHER): Payer: Medicare Other

## 2021-05-20 ENCOUNTER — Other Ambulatory Visit: Payer: Self-pay

## 2021-05-20 DIAGNOSIS — I152 Hypertension secondary to endocrine disorders: Secondary | ICD-10-CM

## 2021-05-20 DIAGNOSIS — I5032 Chronic diastolic (congestive) heart failure: Secondary | ICD-10-CM | POA: Diagnosis not present

## 2021-05-20 DIAGNOSIS — E1165 Type 2 diabetes mellitus with hyperglycemia: Secondary | ICD-10-CM

## 2021-05-20 DIAGNOSIS — I480 Paroxysmal atrial fibrillation: Secondary | ICD-10-CM | POA: Diagnosis not present

## 2021-05-20 DIAGNOSIS — E782 Mixed hyperlipidemia: Secondary | ICD-10-CM

## 2021-05-20 DIAGNOSIS — E1159 Type 2 diabetes mellitus with other circulatory complications: Secondary | ICD-10-CM

## 2021-05-20 DIAGNOSIS — Z794 Long term (current) use of insulin: Secondary | ICD-10-CM

## 2021-05-20 NOTE — Patient Instructions (Addendum)
Visit Information   PATIENT GOALS:   Goals Addressed             This Visit's Progress    Set My Target A1C-Diabetes Type 2       Timeframe:  Long-Range Goal Priority:  High Start Date:    05/20/2021                         Expected End Date:   11/20/2021                    Follow Up Date 08/19/2021   - set target A1C goal <7.0%    Why is this important?   Your target A1C is decided together by you and your doctor.  It is based on several things like your age and other health issues.  A1c control prevents damage to eyes, heart, kidneys, nerves, and immune system when well contorlled       Track and Manage Symptoms-Heart Failure       Timeframe:  Long-Range Goal Priority:  High Start Date:  05/20/2021                           Expected End Date:  11/20/2021                     Follow Up Date 08/19/2021   - develop a rescue plan - eat more whole grains, fruits and vegetables, lean meats and healthy fats - follow rescue plan if symptoms flare-up - know when to call the doctor - track symptoms and what helps feel better or worse - dress right for the weather, hot or cold    Why is this important?   You will be able to handle your symptoms better if you keep track of them.  Making some simple changes to your lifestyle will help.  Eating healthy is one thing you can do to take good care of yourself.          Consent to CCM Services: Ms. Tomson was given information about Chronic Care Management services today including:  CCM service includes personalized support from designated clinical staff supervised by her physician, including individualized plan of care and coordination with other care providers 24/7 contact phone numbers for assistance for urgent and routine care needs. Service will only be billed when office clinical staff spend 20 minutes or more in a month to coordinate care. Only one practitioner may furnish and bill the service in a calendar month. The patient  may stop CCM services at any time (effective at the end of the month) by phone call to the office staff. The patient will be responsible for cost sharing (co-pay) of up to 20% of the service fee (after annual deductible is met).  Patient agreed to services and verbal consent obtained.   The patient verbalized understanding of instructions, educational materials, and care plan provided today and declined offer to receive copy of patient instructions, educational materials, and care plan.   Telephone follow up appointment with care management team member scheduled for: 2 months The patient has been provided with contact information for the care management team and has been advised to call with any health related questions or concerns.   Tomasa Blase, PharmD Clinical Pharmacist, [place]  CLINICAL CARE PLAN: Patient Care Plan: CCM Care Plan     Problem Identified: HTN, HLD, HF, Afib,  Chronic Pain of right shoulder, Dry Eye, Hypothyroidism, DM2   Priority: High  Onset Date: 05/20/2021     Long-Range Goal: Disease Management   Start Date: 05/20/2021  Expected End Date: 11/20/2021  This Visit's Progress: On track  Priority: High  Note:   Current Barriers:  Unable to independently monitor therapeutic efficacy Unable to achieve control of blood sugar/ A1c   Pharmacist Clinical Goal(s):  Patient will achieve adherence to monitoring guidelines and medication adherence to achieve therapeutic efficacy achieve control of A1c as evidenced by blood sugar logs maintain control of LDL, Afib / HF as evidenced by next lipid panel / blood pressure and heart rate logs  through collaboration with PharmD and provider.   Interventions: 1:1 collaboration with Horald Pollen, MD regarding development and update of comprehensive plan of care as evidenced by provider attestation and co-signature Inter-disciplinary care team collaboration (see longitudinal plan of care) Comprehensive  medication review performed; medication list updated in electronic medical record  Hyperlipidemia: (LDL goal < 70) -Controlled -Last LDL 57 mg/dL (09/12/2020) -Current treatment: Rosuvastatin 62m daily  -Medications previously tried: n/a  -Current dietary patterns: reports to eating more fish and chicken, avoids red meats / fried or fatty foods -Current exercise habits: limited -Educated on Cholesterol goals;  Benefits of statin for ASCVD risk reduction; Importance of limiting foods high in cholesterol; -Counseled on diet and exercise extensively Recommended to continue current medication  Diabetes (A1c goal <7%) -Not ideally controlled -Last A1c 7.4% (11/29/2020) -Current medications: Humalog mix 75/25 - 15 units twice daily  Ozempic 173mweekly - not currently taking  -Medications previously tried: metformin, glimepiride  -Current home glucose readings fasting glucose: 150, recently with increase in pain and stress has been averaing ~200 - has been as high as 300 -Denies hypoglycemic/hyperglycemic symptoms -Current meal patterns:  breakfast: eggs, oatmeal with coffee, on occasion may eat cereal  lunch: cottage cheese, pineapple, tunafish sandwich  dinner: fish, shrimp, chicken, + vegetable  snacks: apple, orange, plantains drinks: water, diet soda -Current exercise: limited at this time -Educated on A1c and blood sugar goals; Complications of diabetes including kidney damage, retinal damage, and cardiovascular disease; Exercise goal of 150 minutes per week; Prevention and management of hypoglycemic episodes; Benefits of routine self-monitoring of blood sugar; Continuous glucose monitoring; Carbohydrate counting and/or plate method -Counseled to check feet daily and get yearly eye exams -Counseled on diet and exercise extensively Recommended for patient to restart ozempic at 0.75m39meekly x 2 weeks, then resme 1mg675mekly dose (to prevent GI side effects as she has been  without ozempic for 2 months  Atrial Fibrillation (Goal: prevent stroke and major bleeding) -Controlled -CHADSVASC: at least 5 -Current treatment: Rate control: Amiodarone 200mg71mly / diltiazem 30mg 13mtablet every 4 hours as needed (if in afib) Anticoagulation: Eliquis 75mg tw38m daily  -Medications previously tried: metoprolol, dofetilide -Home BP and HR readings: ~140/60 with heart rate 65-77  -Counseled on increased risk of stroke due to Afib and benefits of anticoagulation for stroke prevention; importance of adherence to anticoagulant exactly as prescribed; bleeding risk associated with Eliquis and importance of self-monitoring for signs/symptoms of bleeding; avoidance of NSAIDs due to increased bleeding risk with anticoagulants; importance of regular laboratory monitoring; seeking medical attention after a head injury or if there is blood in the urine/stool; -Recommended to continue current medication  Heart Failure (Goal: manage symptoms and prevent exacerbations)/Hypertension (BP goal <140/90) -Unsure of current level of control -Last ejection fraction: 60-65% (Date: 05/26/2016) -HF  type: Diastolic -NYHA Class: III (marked limitation of activity) -Current treatment: Clonidine 0.38m - 1 tablet twice daily  Torsemide 268m- 2 tablets daily - patient taking 1 tablet daily due to excessive urination  Potassium Chloride 1033m- 2 tablets daily - has not been taking Last Potassium level (3.3 mmol/L - 03/12/2021) Losartan 100m11m1 tablet daily  Verapamil 120mg37m tablet once daily - dose reduced due to swelling in legs - reduced at advised of Cardiology  -Medications previously tried: triamterene/hctz, spironolactone, metoprolol, lisinopril, isosorbide mononitrate, furosemide, eplerenone, amlodipine,   -Current home BP/HR readings: reports that BP averaged 140/60, HR 65-75 bpm -Current dietary habits: eats a sodium reduced diet, does not add any additional salt to her  foods -Current exercise habits: limited -Educated on Benefits of medications for managing symptoms and prolonging life Importance of weighing daily; if you gain more than 3 pounds in one day or 5 pounds in one week, make clinic aware Proper diuretic administration and potassium supplementation - patient agreeable to resume potassium supplementation  Importance of blood pressure control -Counseled on diet and exercise extensively Recommended to continue current medication  Hypothyroidism (Goal: Maintenance of euthyroid levels) -Controlled -Last TSH level 3.350uIU/mL (03/12/2021) -Current treatment  Levothyroxine 75mcg107m tablet daily  -Medications previously tried: n/a  -Recommended to continue current medication  Gout (Goal: Prevention of gout attack / control of pain) -Controlled -Current treatment  Allopurinol 300mg d86m  -Medications previously tried: n/a  -Recommended to continue current medication  Chronic pain of right shoulder (Goal: Pain control ) -Not ideally controlled - due to recent fall -Current treatment  Lidocaine 5% patch - 1 patch daily (12 hours on/ 12 hours off) - has not been using recently  - uses on her back, has not had to use recently Hydrocodone/APAP 5-325mg - 48mblet every 6 hours as needed  -Medications previously tried: tramadol, oxycodone  -Recommended to continue current medication  Dry Eye (Goal: prevention of irritation of eye) -Controlled -Current treatment  Restasis 0.05% - 1 drop into both eyes twice daily  Olopatadine 0.2% - 1 drop into right eye once daily  -Medications previously tried: ketorolac  -Recommended to continue current medication  Health Maintenance -Vaccine gaps: Influenza, COVID booster, and Shingles vaccines -Current therapy:  Loperamide 2mg - 1 1msule 4 times daily as needed  Triamcinolone 0.1% cream - applied daily as needed  -Educated on Cost vs benefit of each product must be carefully weighed by individual  consumer -Patient is satisfied with current therapy and denies issues -Recommended to continue current medication  Patient Goals/Self-Care Activities Patient will:  - take medications as prescribed check glucose at least once daily, document, and provide at future appointments check blood pressure once daily, document, and provide at future appointments weigh daily, and contact provider if weight gain of more than 3lbs in a day or more than 5 pounds in a week engage in dietary modifications by reducing sodium intake, moderation of carbohydrate / foods high in cholesterol  Follow Up Plan: Telephone follow up appointment with care management team member scheduled for: 3 months The patient has been provided with contact information for the care management team and has been advised to call with any health related questions or concerns.

## 2021-05-20 NOTE — Progress Notes (Signed)
Chronic Care Management Pharmacy Note  05/20/2021 Name:  [name] MRN:  [id number] DOB:  [birth year]  Summary: - Patient reports that she is recovering from recent dental surgeries, reports to swollen mouth and pain from surgeries, has follow up with oral surgeon today to discuss next steps in treatment for recovery -Patient notes to a recent fall to her right shoulder again, has been in increasing pain from this fall and recent dental surgery, had PCP appointment scheduled for examination of shoulder 05/21/2021 -Reports that she has been without ozempic for about 2 months due, has received new prescription and plans to restart this week, blood sugars had been averaging ~150, but due to recent pain and stress has been elevated into the 200's recently at times  - Reports that blood pressures have been averaging ~140/60 with a heart rate averaging 65-75 - reports to no recent issues or occurrences of Afib (had not had to use diltiazem recently)   Recommendations/Changes made from today's visit: - Recommending for patient to restart ozempic at 0.5mg  weekly for 2 weeks then increase back to 1mg  weekly dose to prevent GI issues (patient will bring in ozempic pen to office tomorrow for dosing review) -Patient to continue checking blood sugars and blood pressure/ HR once daily, will reach out if elevated from goal BG<150 and BP <140/90 HR 60-100  - Patient to continue to monitor for edema / issues with breathing (signs of HF exacerbation) to reach out with any issues or concerns  Subjective: [name] is an 80 y.o. year old female who is a primary patient of Sagardia, Eilleen Kempf, MD.  The CCM team was consulted for assistance with disease management and care coordination needs.    Engaged with patient by telephone for initial visit in response to provider referral for pharmacy case management and/or care coordination services.   Consent to Services:  The patient was given the following  information about Chronic Care Management services today, agreed to services, and gave verbal consent: 1. CCM service includes personalized support from designated clinical staff supervised by the primary care provider, including individualized plan of care and coordination with other care providers 2. 24/7 contact phone numbers for assistance for urgent and routine care needs. 3. Service will only be billed when office clinical staff spend 20 minutes or more in a month to coordinate care. 4. Only one practitioner may furnish and bill the service in a calendar month. 5.The patient may stop CCM services at any time (effective at the end of the month) by phone call to the office staff. 6. The patient will be responsible for cost sharing (co-pay) of up to 20% of the service fee (after annual deductible is met). Patient agreed to services and consent obtained.  Patient Care Team: Georgina Quint, MD as PCP - General (Internal Medicine) Duke Salvia, MD as PCP - Electrophysiology (Cardiology) Sherren Mocha, MD as Resident (Family Medicine) [name] Corner, RN as Case Manager Tawsha Terrero, Vinnie Level, [facility] as Pharmacist (Pharmacist)  Recent office visits: 03/11/2021 - PCP visit - BP and diabetic check / dysuria - given 7 day course of Augmentin  02/28/2021 - PCP visit - dysuria - prescribed cefuroxime x 7 days  01/23/2021 - PCP visit - HTN, DM - no changes to medications   Recent consult visits: Reports that she had dental surgery 7/18, 7/19 - has had some bleeding (went to ER 7/20) and has follow up with oral surgeon today to discuss  04/11/2021 [facility]  [facility] - for urinary pressure and frequent urination  04/05/2021 - Dr. Maple Hudson, Pulmonary Disease - OSA - counseled on appropriate use of CPAP machine  03/29/2021 - Dr. Eloy End - Podiatry - nail debridement  03/13/2021 - Dr. Magnus Ivan - Orthopedic Surgery- pain of right shoulder - noted full-thickness retracted rotator cuff tear - has found some success with  PT, no additional surgery options at this time  03/12/2021 - Gypsy Balsam NP - cardiology - electrophysiology follow up - ekg complete - shows sinus rhythm - continue amiodarone, and eliquis - diltiazem prn for AF episodes  02/27/2021 - Dr. Christella Hartigan - Gastroenterology -chronic alternating constipation/ diarrhea - planned colonoscopy (scheduled for 06/13/2021) 01/30/2021 - Dr. Magnus Ivan - Orthopedic Surgery - MRI completed - showed rotator cuff tear -surgery not recommended patient advised to complete PT  01/10/2021 - Dr. Lonzo Cloud - Endocrinology - A1c 7.4% - using CGM - medications continued at this time   Hospital visits: 05/08/2021 - ED visit due to hemorrhage of tooth socket  - txa administered - teabags placed, surgical foam placed - bleeding was slowed but had continued - discharged at her request - following up with oral surgeon   Objective:  Lab Results  Component Value Date   CREATININE 0.74 03/12/2021   BUN 16 03/12/2021   GFR 71.14 01/23/2021   GFRNONAA 78 11/29/2020   GFRAA 90 11/29/2020   NA 141 03/12/2021   K 3.3 (L) 03/12/2021   CALCIUM 9.5 03/12/2021   CO2 31 (H) 03/12/2021   GLUCOSE 182 (H) 03/12/2021    Lab Results  Component Value Date/Time   HGBA1C 7.4 (A) 11/29/2020 10:33 AM   HGBA1C 7.7 (H) 09/12/2020 11:59 AM   HGBA1C 7.9 (A) 03/28/2020 11:39 AM   HGBA1C 9.5 (H) 10/03/2013 07:05 PM   GFR 71.14 01/23/2021 03:40 PM   GFR 80.75 01/03/2021 02:39 PM   MICROALBUR 6.3 (H) 06/21/2020 10:53 AM   MICROALBUR 2.48 (H) 08/21/2009 09:13 PM    Last diabetic Eye exam:  No results found for: HMDIABEYEEXA  Last diabetic Foot exam:  No results found for: HMDIABFOOTEX   Lab Results  Component Value Date   CHOL 135 09/12/2020   HDL 62 09/12/2020   LDLCALC 57 09/12/2020   TRIG 84 09/12/2020   CHOLHDL 2.2 09/12/2020    Hepatic Function Latest Ref Rng & Units 03/12/2021 01/23/2021 11/29/2020  Total Protein 6.0 - 8.5 g/dL 7.0 7.3 6.7  Albumin 3.7 - 4.7 g/dL 4.2 3.9 4.0  AST 0 -  40 IU/L 20 16 12   ALT 0 - 32 IU/L 17 13 10   Alk Phosphatase 44 - 121 IU/L 125(H) 110 121  Total Bilirubin 0.0 - 1.2 mg/dL 0.4 0.5 0.4  Bilirubin, Direct 0.00 - 0.40 mg/dL - - -    Lab Results  Component Value Date/Time   TSH 3.350 03/12/2021 11:52 AM   TSH 2.050 07/05/2020 10:07 AM   FREET4 1.43 02/21/2020 08:46 AM   FREET4 1.24 (H) 05/22/2016 09:45 AM    CBC Latest Ref Rng & Units 05/08/2021 03/12/2021 01/23/2021  WBC 4.0 - 10.5 K/uL 12.8(H) 12.9(H) 13.6(H)  Hemoglobin 12.0 - 15.0 g/dL 11.4(L) 12.0 11.9(L)  Hematocrit 36.0 - 46.0 % 37.5 38.2 37.4  Platelets 150 - 400 K/uL 324 357 351.0    Lab Results  Component Value Date/Time   VD25OH 21.09 (L) 01/25/2021 10:16 AM   VD25OH 23.37 (L) 01/23/2021 03:40 PM    Clinical ASCVD: Yes  The 10-year ASCVD risk score Denman George DC Montez Hageman., et  al., 2013) is: 35.7%   Values used to calculate the score:     Age: 5 years     Sex: Female     Is Non-Hispanic African American: No     Diabetic: Yes     Tobacco smoker: No     Systolic Blood Pressure: 112 mmHg     Is BP treated: Yes     HDL Cholesterol: 62 mg/dL     Total Cholesterol: 135 mg/dL    Depression screen [facility] 2/9 03/25/2021 01/23/2021 01/09/2021  Decreased Interest 0 0 0  Down, Depressed, Hopeless 0 0 0  PHQ - 2 Score 0 0 0  Some recent data might be hidden    Social History   Tobacco Use  Smoking Status Never  Smokeless Tobacco Never   BP Readings from Last 3 Encounters:  05/08/21 (!) 112/44  04/05/21 126/78  03/12/21 136/64   Pulse Readings from Last 3 Encounters:  05/08/21 61  04/05/21 64  03/12/21 68   Wt Readings from Last 3 Encounters:  04/05/21 253 lb (114.8 kg)  03/12/21 254 lb 6.4 oz (115.4 kg)  03/11/21 251 lb (113.9 kg)   BMI Readings from Last 3 Encounters:  04/05/21 47.80 kg/m  03/12/21 48.07 kg/m  03/11/21 47.43 kg/m    Assessment/Interventions: Review of patient past medical history, allergies, medications, health status, including review of consultants  reports, laboratory and other test data, was performed as part of comprehensive evaluation and provision of chronic care management services.   SDOH:  (Social Determinants of Health) assessments and interventions performed: Yes  SDOH Screenings   Alcohol Screen: Low Risk    Last Alcohol Screening Score (AUDIT): 0  Depression (PHQ2-9): Low Risk    PHQ-2 Score: 0  Financial Resource Strain: Not on file  Food Insecurity: No Food Insecurity   Worried About Programme researcher, broadcasting/film/video in the Last Year: Never true   Ran Out of Food in the Last Year: Never true  Housing: Low Risk    Last Housing Risk Score: 0  Physical Activity: Not on file  Social Connections: Not on file  Stress: Not on file  Tobacco Use: Low Risk    Smoking Tobacco Use: Never   Smokeless Tobacco Use: Never  Transportation Needs: No Transportation Needs   Lack of Transportation (Medical): No   Lack of Transportation (Non-Medical): No    CCM Care Plan  Allergies  Allergen Reactions   Metformin Diarrhea    Medications Reviewed Today     Reviewed by Ellin Saba, [facility] (Pharmacist) on 05/20/21 at 0909  Med List Status: <None>   Medication Order Taking? Sig Documenting Provider Last Dose Status Informant  Accu-Chek Softclix Lancets lancets [id number] Yes 1 each by Other route 3 (three) times daily. as directed Janeece Agee, NP Taking Active Self           Med Note Vernard Gambles   Tue Mar 12, 2021 11:12 AM)    allopurinol (ZYLOPRIM) 300 MG tablet [id number] Yes TAKE 1 TABLET(300 MG) BY MOUTH DAILY  Patient taking differently: Take 300 mg by mouth daily.   Georgina Quint, MD Taking Active   amiodarone (PACERONE) 200 MG tablet [id number] Yes Take 1 tablet (200 mg total) by mouth daily. Duke Salvia, MD Taking Active Self  blood glucose meter kit and supplies [id number] Yes Dispense based on patient and insurance preference. Use up to four times daily as directed. (FOR ICD-10 E10.9, E11.9).  Patient taking  differently: 1 each  by Other route See admin instructions. Dispense based on patient and insurance preference. Use up to four times daily as directed. (FOR ICD-10 E10.9, E11.9).   Georgina Quint, MD Taking Active            Med Note Wyvonnia Dusky, Emi Belfast   Wed May 08, 2021 10:51 AM)    cloNIDine (CATAPRES) 0.3 MG tablet [id number] Yes TAKE 1 TABLET(0.3 MG) BY MOUTH TWICE DAILY  Patient taking differently: Take 0.3 mg by mouth 2 (two) times daily.   Georgina Quint, MD Taking Active   Continuous Blood Gluc Receiver (FREESTYLE LIBRE 14 DAY READER) [place] [id number] No Use as directed.  Patient not taking: Reported on 05/20/2021   Georgina Quint, MD Not Taking Active            Med Note Ansel Bong May 08, 2021 10:51 AM)    Continuous Blood Gluc Sensor (FREESTYLE LIBRE 14 DAY SENSOR) [place] [id number] No Use as directed to check blood sugar daily  Patient not taking: Reported on 05/20/2021   [name], Konrad Dolores, MD Not Taking Active            Med Note Hale Bogus   Wed May 08, 2021 10:51 AM)    diltiazem (CARDIZEM) 30 MG tablet [id number] No Take 30 mg by mouth See admin instructions. Take one tablet every 4 hours as needed for heart rate greater than 100 and blood pressure needs to be above 100  Patient not taking: Reported on 05/20/2021   Newman Nip, NP Not Taking Active Self           Med Note Hale Bogus   Wed May 08, 2021 10:51 AM)    [name] 5 MG TABS tablet [id number] Yes TAKE 1 TABLET(5 MG) BY MOUTH TWICE DAILY  Patient taking differently: Take 5 mg by mouth 2 (two) times daily.   Duke Salvia, MD Taking Active   HYDROcodone-acetaminophen (NORCO/VICODIN) 5-325 MG tablet [id number] Yes Take 1 tablet by mouth every 6 (six) hours as needed. [provider] Taking Active Self  Insulin Lispro Prot & Lispro (HUMALOG MIX 75/25 KWIKPEN) (75-25) 100 UNIT/ML Stephanie Coup [id number] Yes Inject 15 Units into the skin 2 (two) times  daily. [name], Konrad Dolores, MD Taking Active Self  levothyroxine (SYNTHROID) 75 MCG tablet [id number]  Take 1 tablet (75 mcg total) by mouth daily before breakfast. Georgina Quint, MD  Expired 05/08/21 2359 Self  lidocaine (LIDODERM) 5 % [id number] No Place 1 patch onto the skin daily. Remove & Discard patch within 12 hours or as directed by MD  Patient not taking: No sig reported   Kathryne Hitch, MD Not Taking Active Self           Med Note Hale Bogus   Wed May 08, 2021 10:52 AM)    loperamide (IMODIUM) 2 MG capsule [id number] Yes Take 1 capsule (2 mg total) by mouth 4 (four) times daily as needed for diarrhea or loose stools. Mare Ferrari, PA-C Taking Active Self           Med Note Phebe Colla Jul 09, 2020 11:11 AM)    losartan (COZAAR) 100 MG tablet [id number] Yes Take 1 tablet (100 mg total) by mouth daily. Georgina Quint, MD Taking Active Self  Na Sulfate-K Sulfate-Mg Sulf (SUPREP BOWEL PREP KIT) 17.5-3.13-1.6 GM/177ML SOLN [id number]  Take 1 kit by mouth as directed. Rachael Fee, MD  Active Self           Med Note Wyvonnia Dusky, Emi Belfast   Wed May 08, 2021 11:01 AM) Procedure scheduled for next month  NOVOFINE PLUS PEN NEEDLE 32G X 4 MM MISC [id number] Yes USE TWICE DAILY AS DIRECTED  Patient taking differently: 1 each by Other route as directed.   Georgina Quint, MD Taking Active   Olopatadine HCl 0.2 % SOLN [id number] Yes Place 1 drop into the right eye daily. [provider] Taking Active Self  ONETOUCH VERIO test strip [id number] Yes 1 each by Other route 3 (three) times daily. [provider] Taking Active Self           Med Note Hale Bogus   Wed May 08, 2021 10:53 AM)    OZEMPIC, 1 MG/DOSE, 4 MG/3ML SOPN [id number] No INJECT 1MG  INTO SKIN ONCE WEEKLY AS DIRECTED  Patient not taking: Reported on 05/20/2021   Georgina Quint, MD Not Taking Active   potassium chloride (KLOR-CON) 10 MEQ  tablet [id number] No Take 2 tablets (20 mEq total) by mouth daily.  Patient not taking: Reported on 05/20/2021   Marily Lente, NP Not Taking Active Self  RESTASIS MULTIDOSE 0.05 % ophthalmic emulsion [id number] Yes Place 1 drop into both eyes 2 (two) times daily.  [provider] Taking Active Self  rosuvastatin (CRESTOR) 10 MG tablet [id number] Yes TAKE 1 TABLET BY MOUTH IN  THE EVENING  Patient taking differently: Take 10 mg by mouth every evening.   Georgina Quint, MD Taking Active   torsemide [facility]) 20 MG tablet [id number] Yes TAKE 2 TABLETS(40 MG) BY MOUTH DAILY  Patient taking differently: Take 20 mg by mouth daily.   Duke Salvia, MD Taking Active   triamcinolone cream (KENALOG) 0.1 % [id number] No Apply 1 application topically 2 (two) times daily.  Patient not taking: Reported on 05/20/2021   Georgina Quint, MD Not Taking Active            Med Note Vernard Gambles   Tue Mar 12, 2021 11:12 AM)    verapamil (CALAN) 120 MG tablet [id number] Yes Take 1 tablet (120 mg total) by mouth 2 (two) times daily.  Patient taking differently: Take 120 mg by mouth daily. Taking once daily due to swelling in legs that she had when taken twice daily   Duke Salvia, MD Taking Active            Med Note Clinton Quant May 20, 2021  8:43 AM)              Patient Active Problem List   Diagnosis Date Noted   Dysthymia 03/12/2021   Dysuria 03/01/2021   Primary osteoarthritis involving multiple joints 03/01/2021   Recurrent falls 01/10/2021   Sensorineural hearing loss (SNHL), 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   Persistent atrial fibrillation (HCC) 08/12/2020   Type 2 diabetes mellitus with hyperglycemia, with long-term current use of insulin (HCC) 06/21/2020   Type 2 diabetes mellitus with diabetic polyneuropathy, with long-term current use of insulin (HCC) 06/21/2020   Mixed hyperlipidemia 06/21/2020   Uncontrolled hypertension 06/11/2020   Aortic atherosclerosis (HCC)  03/28/2020   Diabetic peripheral neuropathy (HCC) 12/26/2019   Intertrigo 10/23/2019   Gastroesophageal reflux disease without esophagitis 03/17/2019   Rash 03/17/2019   Bilateral leg edema 02/16/2019   Pruritus 02/16/2019   Varicose veins of both legs with edema 02/16/2019   Heart murmur 09/05/2018   Restrictive lung disease  02/22/2018   Dysgeusia 06/11/2017   Allergic dermatitis 06/03/2017   Osteoarthritis of left knee 06/03/2017   NAFLD (nonalcoholic fatty liver disease) 30/86/5784   Osteoporosis 12/31/2015   Obesity 12/19/2015   Hypersomnia 12/19/2015   Edema 10/18/2015   Acquired hypothyroidism 10/18/2015   Acute diastolic congestive heart failure, NYHA class 3 (HCC) 10/03/2013   Prolonged QT interval 10/03/2013   Acute diastolic heart failure (HCC) 10/03/2013   Long term (current) use of anticoagulants 06/09/2012   BENIGN NEOPLASM OF ADRENAL GLAND 11/25/2010   Chronic diastolic heart failure (HCC) 02/20/2010   DM 05/16/2009   GOUT 05/16/2009   OBESITY, MORBID 05/16/2009   Hypertension associated with diabetes (HCC) 05/16/2009   Paroxysmal atrial fibrillation (HCC) 05/16/2009   HYPERLIPIDEMIA 11/30/2008   Obstructive sleep apnea on CPAP 11/30/2008    Immunization History  Administered Date(s) Administered   Fluad Quad(high Dose 65+) 12/31/2015   Influenza Split 09/20/2011, 07/13/2012   Influenza, High Dose Seasonal PF 08/07/2016, 08/04/2017, 07/21/2018, 07/22/2019   Influenza,inj,Quad PF,6+ Mos 10/04/2013   Influenza-Unspecified 09/20/2011, 07/13/2012, 10/04/2013, 08/07/2016   Pneumococcal Conjugate-13 05/11/2014   Pneumococcal Polysaccharide-23 07/13/2012   Pneumococcal-Unspecified 07/15/2012   Zoster, Live 07/29/2012    Conditions to be addressed/monitored:  Hypertension, Hyperlipidemia, Diabetes, Atrial Fibrillation, Heart Failure, Hypothyroidism, Osteoarthritis, Gout, and Dry Eyes  Care Plan : CCM Care Plan  Updates made by Ellin Saba, RPH since  05/20/2021 12:00 AM     Problem: HTN, HLD, HF, Afib, Chronic Pain of right shoulder, Dry Eye, Hypothyroidism, DM2   Priority: High  Onset Date: 05/20/2021     Long-Range Goal: Disease Management   Start Date: 05/20/2021  Expected End Date: 11/20/2021  This Visit's Progress: On track  Priority: High  Note:   Current Barriers:  Unable to independently monitor therapeutic efficacy Unable to achieve control of blood sugar/ A1c   Pharmacist Clinical Goal(s):  Patient will achieve adherence to monitoring guidelines and medication adherence to achieve therapeutic efficacy achieve control of A1c as evidenced by blood sugar logs maintain control of LDL, Afib / HF as evidenced by next lipid panel / blood pressure and heart rate logs  through collaboration with PharmD and provider.   Interventions: 1:1 collaboration with Georgina Quint, MD regarding development and update of comprehensive plan of care as evidenced by provider attestation and co-signature Inter-disciplinary care team collaboration (see longitudinal plan of care) Comprehensive medication review performed; medication list updated in electronic medical record  Hyperlipidemia: (LDL goal < 70) -Controlled -Last LDL 57 mg/dL (69/62/9528) -Current treatment: Rosuvastatin 10mg  daily  -Medications previously tried: n/a  -Current dietary patterns: reports to eating more fish and chicken, avoids red meats / fried or fatty foods -Current exercise habits: limited -Educated on Cholesterol goals;  Benefits of statin for ASCVD risk reduction; Importance of limiting foods high in cholesterol; -Counseled on diet and exercise extensively Recommended to continue current medication  Diabetes (A1c goal <7%) -Not ideally controlled -Last A1c 7.4% (11/29/2020) -Current medications: Humalog mix 75/25 - 15 units twice daily  Ozempic 1mg  weekly - not currently taking  -Medications previously tried: metformin, glimepiride  -Current home  glucose readings fasting glucose: 150, recently with increase in pain and stress has been averaing ~200 - has been as high as 300 -Denies hypoglycemic/hyperglycemic symptoms -Current meal patterns:  breakfast: eggs, oatmeal with coffee, on occasion may eat cereal  lunch: cottage cheese, pineapple, tunafish sandwich  dinner: fish, shrimp, chicken, + vegetable  snacks: apple, orange, plantains drinks: water, diet soda -Current exercise:  limited at this time -Educated on A1c and blood sugar goals; Complications of diabetes including kidney damage, retinal damage, and cardiovascular disease; Exercise goal of 150 minutes per week; Prevention and management of hypoglycemic episodes; Benefits of routine self-monitoring of blood sugar; Continuous glucose monitoring; Carbohydrate counting and/or plate method -Counseled to check feet daily and get yearly eye exams -Counseled on diet and exercise extensively Recommended for patient to restart ozempic at 0.5mg  weekly x 2 weeks, then resme 1mg  weekly dose (to prevent GI side effects as she has been without ozempic for 2 months  Atrial Fibrillation (Goal: prevent stroke and major bleeding) -Controlled -CHADSVASC: at least 5 -Current treatment: Rate control: Amiodarone 200mg  daily / diltiazem 30mg  - 1 tablet every 4 hours as needed (if in afib) Anticoagulation: Eliquis 5mg  twice daily  -Medications previously tried: metoprolol, dofetilide -Home BP and HR readings: ~140/60 with heart rate 65-77  -Counseled on increased risk of stroke due to Afib and benefits of anticoagulation for stroke prevention; importance of adherence to anticoagulant exactly as prescribed; bleeding risk associated with Eliquis and importance of self-monitoring for signs/symptoms of bleeding; avoidance of NSAIDs due to increased bleeding risk with anticoagulants; importance of regular laboratory monitoring; seeking medical attention after a head injury or if there is blood in  the urine/stool; -Recommended to continue current medication  Heart Failure (Goal: manage symptoms and prevent exacerbations)/Hypertension (BP goal <140/90) -Unsure of current level of control -Last ejection fraction: 60-65% (Date: 05/26/2016) -HF type: Diastolic -NYHA Class: III (marked limitation of activity) -Current treatment: Clonidine 0.3mg  - 1 tablet twice daily  Torsemide 20mg  - 2 tablets daily - patient taking 1 tablet daily due to excessive urination  Potassium Chloride - 2 tablets daily - has not been taking Last Potassium level (3.3 mmol/L - 03/12/2021) Losartan 100mg  - 1 tablet daily  Verapamil 120mg  - 1 tablet once daily - dose reduced due to swelling in legs - reduced at advised of Cardiology  -Medications previously tried: triamterene/hctz, spironolactone, metoprolol, lisinopril, isosorbide mononitrate, furosemide, eplerenone, amlodipine,   -Current home BP/HR readings: reports that BP averaged 140/60, HR 65-75 bpm -Current dietary habits: eats a sodium reduced diet, does not add any additional salt to her foods -Current exercise habits: limited -Educated on Benefits of medications for managing symptoms and prolonging life Importance of weighing daily; if you gain more than 3 pounds in one day or 5 pounds in one week, make clinic aware Proper diuretic administration and potassium supplementation - patient agreeable to resume potassium supplementation  Importance of blood pressure control -Counseled on diet and exercise extensively Recommended to continue current medication  Hypothyroidism (Goal: Maintenance of euthyroid levels) -Controlled -Last TSH level 3.350uIU/mL (03/12/2021) -Current treatment  Levothyroxine - 1 tablet daily  -Medications previously tried: n/a  -Recommended to continue current medication  Gout (Goal: Prevention of gout attack / control of pain) -Controlled -Current treatment  Allopurinol 300mg  daily  -Medications previously tried:  n/a  -Recommended to continue current medication  Chronic pain of right shoulder (Goal: Pain control ) -Not ideally controlled - due to recent fall -Current treatment  Lidocaine 5% patch - 1 patch daily (12 hours on/ 12 hours off) - has not been using recently  - uses on her back, has not had to use recently Hydrocodone/APAP 5-325mg  - 1 tablet every 6 hours as needed  -Medications previously tried: tramadol, oxycodone  -Recommended to continue current medication  Dry Eye (Goal: prevention of irritation of eye) -Controlled -Current treatment  Restasis 0.05% -  1 drop into both eyes twice daily  Olopatadine 0.2% - 1 drop into right eye once daily  -Medications previously tried: ketorolac  -Recommended to continue current medication  Health Maintenance -Vaccine gaps: Influenza, COVID booster, and Shingles vaccines -Current therapy:  Loperamide 2mg  - 1 capsule 4 times daily as needed  Triamcinolone 0.1% cream - applied daily as needed  -Educated on Cost vs benefit of each product must be carefully weighed by individual consumer -Patient is satisfied with current therapy and denies issues -Recommended to continue current medication  Patient Goals/Self-Care Activities Patient will:  - take medications as prescribed check glucose at least once daily, document, and provide at future appointments check blood pressure once daily, document, and provide at future appointments weigh daily, and contact provider if weight gain of more than 3lbs in a day or more than 5 pounds in a week engage in dietary modifications by reducing sodium intake, moderation of carbohydrate / foods high in cholesterol  Follow Up Plan: Telephone follow up appointment with care management team member scheduled for: 3 months The patient has been provided with contact information for the care management team and has been advised to call with any health related questions or concerns.         Medication Assistance:  None required.  Patient affirms current coverage meets needs.  Patient's preferred pharmacy is:  [facility] [phone] [address] - [place], [place] - [address] [place] [address] Phone: [phone] Fax: [phone]  [id number] [place],  - [address] AT [facility] OF [facility] [address] [place] [address] Phone: [phone] Fax: [phone]  [facility]  [facility]) - [place], [place] - [address] [place] [address] Phone: [phone] Fax: [phone]   Uses pill box? Yes Pt endorses 80-90% compliance  Care Plan and Follow Up Patient Decision:  Patient agrees to Care Plan and Follow-up.  Plan: Telephone follow up appointment with care management team member scheduled for:  2 months and The patient has been provided with contact information for the care management team and has been advised to call with any health related questions or concerns.   Ellin Saba, PharmD Clinical Pharmacist, [facility]

## 2021-05-21 ENCOUNTER — Encounter: Payer: Self-pay | Admitting: Emergency Medicine

## 2021-05-21 ENCOUNTER — Ambulatory Visit (INDEPENDENT_AMBULATORY_CARE_PROVIDER_SITE_OTHER): Payer: Medicare Other | Admitting: Emergency Medicine

## 2021-05-21 ENCOUNTER — Other Ambulatory Visit: Payer: Self-pay

## 2021-05-21 VITALS — BP 146/74 | HR 70 | Ht 61.0 in | Wt 248.0 lb

## 2021-05-21 DIAGNOSIS — S40011A Contusion of right shoulder, initial encounter: Secondary | ICD-10-CM

## 2021-05-21 NOTE — Patient Instructions (Signed)

## 2021-05-21 NOTE — Progress Notes (Signed)
[name] 80 y.o.   Chief Complaint  Patient presents with   Hospitalization Follow-up    HISTORY OF PRESENT ILLNESS: This is a 80 y.o. female here for follow-up of emergency department visit where she presented on 05/08/2021 with postsurgical dental bleeding.  Developed periodontal hematoma and needed several dental procedures to control bleeding. Patient has history of chronic atrial fibrillation on Eliquis.  Better today after taking antibiotics. Also fell last Saturday and reinjured it right shoulder complaining of persistent pain. No other complaints or medical concerns today.  HPI   Prior to Admission medications   Medication Sig Start Date End Date Taking? Authorizing Provider  Accu-Chek Softclix Lancets lancets 1 each by Other route 3 (three) times daily. as directed 03/30/20  Yes Maximiano Coss, NP  allopurinol (ZYLOPRIM) 300 MG tablet TAKE 1 TABLET(300 MG) BY MOUTH DAILY Patient taking differently: Take 300 mg by mouth daily. 01/21/21  Yes Somya Jauregui, Ines Bloomer, MD  amiodarone (PACERONE) 200 MG tablet Take 1 tablet (200 mg total) by mouth daily. 03/20/21  Yes [name], MD  amoxicillin-clavulanate (AUGMENTIN) 500-125 MG tablet Take 1 tablet by mouth 2 (two) times daily. 05/13/21  Yes [provider]  blood glucose meter kit and supplies Dispense based on patient and insurance preference. Use up to four times daily as directed. (FOR ICD-10 E10.9, E11.9). Patient taking differently: 1 each by Other route See admin instructions. Dispense based on patient and insurance preference. Use up to four times daily as directed. (FOR ICD-10 E10.9, E11.9). 05/03/20  Yes Adalene Gulotta, Ines Bloomer, MD  cloNIDine (CATAPRES) 0.3 MG tablet TAKE 1 TABLET(0.3 MG) BY MOUTH TWICE DAILY Patient taking differently: Take 0.3 mg by mouth 2 (two) times daily. 03/31/21  Yes Malyiah Fellows, Ines Bloomer, MD  Continuous Blood Gluc Receiver (FREESTYLE LIBRE 14 DAY READER) DEVI Use as directed. 04/18/20  Yes  Irene Collings, Ines Bloomer, MD  Continuous Blood Gluc Sensor (FREESTYLE LIBRE 14 DAY SENSOR) MISC Use as directed to check blood sugar daily 09/24/20  Yes [name], Melanie Crazier, MD  diltiazem (CARDIZEM) 30 MG tablet Take 30 mg by mouth See admin instructions. Take one tablet every 4 hours as needed for heart rate greater than 100 and blood pressure needs to be above 100   Yes Sherran Needs, NP  ELIQUIS 5 MG TABS tablet TAKE 1 TABLET(5 MG) BY MOUTH TWICE DAILY Patient taking differently: Take 5 mg by mouth 2 (two) times daily. 12/14/20  Yes [name], MD  HYDROcodone-acetaminophen (NORCO/VICODIN) 5-325 MG tablet Take 1 tablet by mouth every 6 (six) hours as needed. 05/06/21  Yes [provider]  Insulin Lispro Prot & Lispro (HUMALOG MIX 75/25 KWIKPEN) (75-25) 100 UNIT/ML Kwikpen Inject 15 Units into the skin 2 (two) times daily. 01/10/21  Yes [name], Melanie Crazier, MD  lidocaine (LIDODERM) 5 % Place 1 patch onto the skin daily. Remove & Discard patch within 12 hours or as directed by MD 08/29/20  Yes Mcarthur Rossetti, MD  loperamide (IMODIUM) 2 MG capsule Take 1 capsule (2 mg total) by mouth 4 (four) times daily as needed for diarrhea or loose stools. 03/20/20  Yes Garald Balding, PA-C  losartan (COZAAR) 100 MG tablet Take 1 tablet (100 mg total) by mouth daily. 05/31/20  Yes Jeanmarc Viernes, Ines Bloomer, MD  Na Sulfate-K Sulfate-Mg Sulf (SUPREP BOWEL PREP KIT) 17.5-3.13-1.6 GM/177ML SOLN Take 1 kit by mouth as directed. 02/27/21  Yes Milus Banister, MD  NOVOFINE PLUS PEN NEEDLE 32G X 4 MM MISC  USE TWICE DAILY AS DIRECTED Patient taking differently: 1 each by Other route as directed. 04/08/21  Yes Saleh Ulbrich, Ines Bloomer, MD  Olopatadine HCl 0.2 % SOLN Place 1 drop into the right eye daily. 02/21/21  Yes [provider]  ONETOUCH VERIO test strip 1 each by Other route 3 (three) times daily. 06/08/20  Yes [provider]  OZEMPIC, 1 MG/DOSE, 4 MG/3ML SOPN INJECT 1MG INTO  SKIN ONCE WEEKLY AS DIRECTED 04/25/21  Yes Zaire Levesque, Ines Bloomer, MD  potassium chloride (KLOR-CON) 10 MEQ tablet Take 2 tablets (20 mEq total) by mouth daily. 03/20/21  Yes Seiler, Amber K, NP  RESTASIS MULTIDOSE 0.05 % ophthalmic emulsion Place 1 drop into both eyes 2 (two) times daily.  03/04/20  Yes [provider]  rosuvastatin (CRESTOR) 10 MG tablet TAKE 1 TABLET BY MOUTH IN  THE EVENING Patient taking differently: Take 10 mg by mouth every evening. 04/25/21  Yes Kimmy Parish, Ines Bloomer, MD  torsemide (DEMADEX) 20 MG tablet TAKE 2 TABLETS(40 MG) BY MOUTH DAILY Patient taking differently: Take 20 mg by mouth daily. 11/07/20  Yes [name], MD  triamcinolone cream (KENALOG) 0.1 % Apply 1 application topically 2 (two) times daily. 07/26/20  Yes Luverna Degenhart, Ines Bloomer, MD  verapamil (CALAN) 120 MG tablet Take 1 tablet (120 mg total) by mouth 2 (two) times daily. Patient taking differently: Take 120 mg by mouth daily. Taking once daily due to swelling in legs that she had when taken twice daily 08/15/20  Yes [name], MD  levothyroxine (SYNTHROID) 75 MCG tablet Take 1 tablet (75 mcg total) by mouth daily before breakfast. 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   Persistent atrial fibrillation ([place]) 08/12/2020   Type 2 diabetes mellitus with hyperglycemia, with long-term current use of insulin ([place]) 06/21/2020   Type 2 diabetes mellitus with diabetic polyneuropathy, with long-term current use of insulin ([place]) 06/21/2020   Mixed hyperlipidemia 06/21/2020   Uncontrolled hypertension 06/11/2020   Aortic atherosclerosis ([place]) 03/28/2020   Diabetic peripheral neuropathy ([place])  12/26/2019   Intertrigo 10/23/2019   Gastroesophageal reflux disease without esophagitis 03/17/2019   Rash 03/17/2019   Bilateral leg edema 02/16/2019   Pruritus 02/16/2019   Varicose veins of both legs with edema 02/16/2019   Heart murmur 09/05/2018   Restrictive lung disease 02/22/2018   Dysgeusia 06/11/2017   Allergic dermatitis 06/03/2017   Osteoarthritis of left knee 06/03/2017   NAFLD (nonalcoholic fatty liver disease) 08/07/2016   Osteoporosis 12/31/2015   Obesity 12/19/2015   Hypersomnia 12/19/2015   Edema 10/18/2015   Acquired hypothyroidism 16/07/9603   Acute diastolic congestive heart failure, NYHA class 3 ([place]) 10/03/2013   Prolonged QT interval 54/06/8118   Acute diastolic heart failure ([place]) 10/03/2013   Long term (current) use of anticoagulants 06/09/2012   BENIGN NEOPLASM OF ADRENAL GLAND 11/25/2010   Chronic diastolic heart failure ([place]) 02/20/2010   DM 05/16/2009   GOUT 05/16/2009   OBESITY, MORBID 05/16/2009   Hypertension associated with diabetes ([place]) 05/16/2009   Paroxysmal atrial fibrillation ([place]) 05/16/2009   HYPERLIPIDEMIA 11/30/2008   Obstructive sleep apnea on CPAP 11/30/2008    Past Medical History:  Diagnosis Date   Arthritis  Back pain    Chronic anticoagulation    due to aflutter   Chronic kidney disease    Diabetes mellitus    Diastolic CHF, chronic ([place])    a.  echo 2006 - ef 55-65%; mild diast dysfxn;    b. Echo 08/2011: Mild LVH, EF 60%;  c. 04/2013 Echo: EF 65-69%, mild conc LVH;  08/2014 Echo: EF 60-65%, mild-mod MR.   Gout    Hyperlipidemia    Hypertension    a.  Renal arterial Dopplers 12/2011: 1-59% right renal artery stenosis   Morbid obesity ([place])    Obstructive sleep apnea on CPAP    Paroxysmal Afib/Flutter    a. 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    Past Surgical History:  Procedure Laterality Date   APPENDECTOMY     ATRIAL FLUTTER ABLATION N/A 09/24/2011   Procedure: ATRIAL FLUTTER ABLATION;  Surgeon: [name], MD;  Location[facility] CATH LAB;  Service: Cardiovascular;  Laterality: N/A;   CARDIOVERSION  10/22/2011   Procedure: CARDIOVERSION;  Surgeon: [name], MD;  Location: [facility];  Service: Cardiovascular;  Laterality: N/A;   CARDIOVERSION N/A 09/10/2011   Procedure: CARDIOVERSION;  Surgeon: [name], MD;  Location: [facility] CATH LAB;  Service: Cardiovascular;  Laterality: N/A;   CHOLECYSTECTOMY     TONSILLECTOMY  1982   TOTAL ABDOMINAL HYSTERECTOMY      Social History   Socioeconomic History   Marital status: Married    Spouse name: Not on file   Number of children: 3   Years of education: Not on file   Highest education level: Not on file  Occupational History   Occupation: DISABILITY/housewife    Employer: RETIRED  Tobacco Use   Smoking status: Never   Smokeless tobacco: Never  Vaping Use   Vaping Use: Never used  Substance and Sexual Activity   Alcohol use: No   Drug use: No   Sexual activity: Yes  Other Topics Concern   Not on file  Social History Narrative   Not on file   Social Determinants of Health   Financial Resource Strain: Not on file  Food Insecurity: No Food Insecurity   Worried About Running Out of Food in the Last Year: Never true   [place] in the Last Year: Never true  Transportation Needs: No Transportation Needs   Lack of Transportation (Medical): No   Lack of Transportation (Non-Medical): No  Physical Activity: Not on file  Stress: Not on file  Social Connections: Not on file  Intimate Partner Violence: Not on file    Family History  Problem Relation Age of Onset   Heart disease Father    Hypertension Father    Breast cancer Sister    Cancer Sister        breast   Colon cancer Neg Hx    Esophageal cancer Neg Hx    Pancreatic cancer Neg Hx    Liver disease Neg Hx      Review of Systems  Constitutional: Negative.  Negative for chills and fever.  HENT: Negative.  Negative for congestion and sore throat.   Respiratory:  Negative.  Negative for cough and shortness of breath.   Cardiovascular: Negative.  Negative for chest pain and palpitations.  Gastrointestinal:  Negative for abdominal pain, diarrhea, nausea and vomiting.  Genitourinary: Negative.  Negative for dysuria and hematuria.  Skin: Negative.  Negative for rash.  Neurological:  Negative for dizziness and headaches.  All other systems reviewed and  are negative.  Today's Vitals   05/21/21 1334  BP: (!) 146/74  Pulse: 70  SpO2: 94%  Weight: 248 lb (112.5 kg)  Height: '5\' 1"'  (1.549 m)   Body mass index is 46.86 kg/m. Wt Readings from Last 3 Encounters:  05/21/21 248 lb (112.5 kg)  04/05/21 253 lb (114.8 kg)  03/12/21 254 lb 6.4 oz (115.4 kg)    Physical Exam Vitals reviewed.  Constitutional:      Appearance: She is obese.  HENT:     Head: Normocephalic.  Eyes:     Extraocular Movements: Extraocular movements intact.     Pupils: Pupils are equal, round, and reactive to light.  Cardiovascular:     Rate and Rhythm: Normal rate. Rhythm irregular.  Pulmonary:     Effort: Pulmonary effort is normal.  Musculoskeletal:     Comments: Right shoulder: No ecchymosis.  Some tenderness to palpation with decreased range of motion.  Skin:    General: Skin is warm and dry.  Neurological:     General: No focal deficit present.     Mental Status: She is alert and oriented to person, place, and time.  Psychiatric:        Mood and Affect: Mood normal.        Behavior: Behavior normal.     ASSESSMENT & PLAN: Takyla was seen today for hospitalization follow-up.  Diagnoses and all orders for this visit:  Contusion of multiple sites of right shoulder, initial encounter Rest.  Tylenol for pain as needed.  Heat pad as tolerated. Contact the office if no better or worse during the next couple weeks. Continue present medications.  No changes.  Patient Instructions  Shoulder Pain Many things can cause shoulder pain, including: An injury. Moving  the shoulder in the same way again and again (overuse). Joint pain (arthritis). Pain can come from: Swelling and irritation (inflammation) of any part of the shoulder. An injury to the shoulder joint. An injury to: Tissues that connect muscle to bone (tendons). Tissues that connect bones to each other (ligaments). Bones. Follow these instructions at home: Watch for changes in your symptoms. Let your doctor know about them. Followthese instructions to help with your pain. If you have a sling: Wear the sling as told by your doctor. Remove it only as told by your doctor. Loosen the sling if your fingers: Tingle. Become numb. Turn cold and blue. Keep the sling clean. If the sling is not waterproof: Do not let it get wet. Take the sling off when you shower or bathe. Managing pain, stiffness, and swelling  If told, put ice on the painful area: Put ice in a plastic bag. Place a towel between your skin and the bag. Leave the ice on for 20 minutes, 2-3 times a day. Stop putting ice on if it does not help with the pain. Squeeze a soft ball or a foam pad as much as possible. This prevents swelling in the shoulder. It also helps to strengthen the arm.  General instructions Take over-the-counter and prescription medicines only as told by your doctor. Keep all follow-up visits as told by your doctor. This is important. Contact a doctor if: Your pain gets worse. Medicine does not help your pain. You have new pain in your arm, hand, or fingers. Get help right away if: Your arm, hand, or fingers: Tingle. Are numb. Are swollen. Are painful. Turn white or blue. Summary Shoulder pain can be caused by many things. These include injury, moving the shoulder  in the same away again and again, and joint pain. Watch for changes in your symptoms. Let your doctor know about them. This condition may be treated with a sling, ice, and pain medicine. Contact your doctor if the pain gets worse or you  have new pain. Get help right away if your arm, hand, or fingers tingle or get numb, swollen, or painful. Keep all follow-up visits as told by your doctor. This is important. This information is not intended to replace advice given to you by your health care provider. Make sure you discuss any questions you have with your healthcare provider. Document Revised: 04/20/2018 Document Reviewed: 04/20/2018 Elsevier Patient Education  2022 [place], MD [facility]

## 2021-05-24 ENCOUNTER — Ambulatory Visit (INDEPENDENT_AMBULATORY_CARE_PROVIDER_SITE_OTHER): Payer: Medicare Other | Admitting: Internal Medicine

## 2021-05-24 ENCOUNTER — Encounter: Payer: Self-pay | Admitting: Internal Medicine

## 2021-05-24 ENCOUNTER — Other Ambulatory Visit: Payer: Self-pay

## 2021-05-24 VITALS — BP 136/82 | HR 63 | Ht 61.0 in | Wt 251.0 lb

## 2021-05-24 DIAGNOSIS — E1165 Type 2 diabetes mellitus with hyperglycemia: Secondary | ICD-10-CM | POA: Diagnosis not present

## 2021-05-24 DIAGNOSIS — R531 Weakness: Secondary | ICD-10-CM | POA: Insufficient documentation

## 2021-05-24 DIAGNOSIS — Z794 Long term (current) use of insulin: Secondary | ICD-10-CM

## 2021-05-24 LAB — POCT GLYCOSYLATED HEMOGLOBIN (HGB A1C): Hemoglobin A1C: 7.8 % — AB (ref 4.0–5.6)

## 2021-05-24 MED ORDER — OZEMPIC (1 MG/DOSE) 4 MG/3ML ~~LOC~~ SOPN: 1.0000 mg | PEN_INJECTOR | SUBCUTANEOUS | 3 refills | Status: DC

## 2021-05-24 MED ORDER — INSULIN LISPRO PROT & LISPRO (75-25 MIX) 100 UNIT/ML KWIKPEN: 17.0000 [IU] | PEN_INJECTOR | Freq: Two times a day (BID) | SUBCUTANEOUS | 3 refills | Status: DC

## 2021-05-24 NOTE — Patient Instructions (Addendum)
-  Humalog Mix 17 units with Breakfast and 17 units with Supper  - Restart Ozempic  1.0 mg weekly      HOW TO TREAT LOW BLOOD SUGARS (Blood sugar LESS THAN 70 MG/DL) Please follow the RULE OF 15 for the treatment of hypoglycemia treatment (when your (blood sugars are less than 70 mg/dL)   STEP 1: Take 15 grams of carbohydrates when your blood sugar is low, which includes:  3-4 GLUCOSE TABS  OR 3-4 OZ OF JUICE OR REGULAR SODA OR ONE TUBE OF GLUCOSE GEL    STEP 2: RECHECK blood sugar in 15 MINUTES STEP 3: If your blood sugar is still low at the 15 minute recheck --> then, go back to STEP 1 and treat AGAIN with another 15 grams of carbohydrates.    SALIVARY CORTISOL COLLECTION INSTRUCTIONS    Precautions:  Please collect sample at [place]. You will need to do this on 2 nights  No food or fluids 30 minutes prior to collection.  Do not use any creams, lotions on hands, or use steroid inhalers 24- hours prior to collection.  Wash hands carefully.  Avoid any activity that could cause your gums to bleed: including flushing of brushing your teeth.  Kit must not be used in children less than 21 years of age, or a person that is at risk for choking on collection kit.  Instructions for saliva collection:   Rinse mouth thoroughly with water and discard. Do not swallow.  Hold the [place] at the rim of the suspended insert and remove the stopper.  Remove the swab.  Place swab under tongue until well saturated, approximately 1 minute.   Return the saturated swab to the suspended insert and close the Salivette firmly with the stopper.  Do not remove the tube holding the insert. The Salivette should be sent to the lab with the swab.   Come to the lab and leave the Salivette kit for labeling with your identifying information.   Make sure you refrigerate sample if not bringing to the lab immediately. Try to use cold packs for transportation if available.

## 2021-05-24 NOTE — Progress Notes (Signed)
Name: [name]  Age/ Sex: 80 y.o., female   MRN/ DOB: [id number], [birth year]     PCP: Horald Pollen, MD   Reason for Endocrinology Evaluation: Type 2 Diabetes Mellitus  Initial Endocrine Consultative Visit: 06/21/2020    PATIENT IDENTIFIER: [name] is a 80 y.o. female with a past medical history of HTN, PAF, CHF, OSA and Dyslipidemia . The patient has followed with Endocrinology clinic since 06/21/2020 for consultative assistance with management of her diabetes.  DIABETIC HISTORY:  [name] was diagnosed with DM in 2011,she is intolerant to metformin due to diarrhea. Her hemoglobin A1c has ranged from 7.3% in 2011, peaking at 9.5% in 2014.  SUBJECTIVE:   During the last visit (01/10/2021): A1c 7.4 %, adjusted insulin and restarted Ozempic      Today (05/24/2021): [name]  She checks her blood sugars multiple times a day  times daily, through CGM . The patient has not had hypoglycemic episodes since the last clinic visit.   Denies nausea or diarrhea Had recent fall   She has been having dental procedures with teeth pain  Son had a stoke, has been stressed  She has not been taking Ozempic - unclear reason   She is not on steroids   HOME DIABETES REGIMEN:  Humalog Mix 15 units with Breakfast and Supper  Ozempic 1 mg weekly       Statin: yes ACE-I/ARB: yes   METER DOWNLOAD SUMMARY: 7/22-05/24/2021  Average Number Tests/Day = 0.5 Overall Mean FS Glucose = 227 Standard Deviation = 44  BG Ranges: Low = 170 High = 290    Hypoglycemic Events/30 Days: BG < 50 = 0 Episodes of symptomatic severe hypoglycemia = 0         DIABETIC COMPLICATIONS: Microvascular complications:  Neuropathy Denies: CKD, , retinopathy Last eye exam: Completed 03/2020   Macrovascular complications:    Denies: CAD, PVD, CVA    HISTORY:  Past Medical History:  Past Medical History:  Diagnosis Date   Arthritis    Back pain    Chronic  anticoagulation    due to aflutter   Chronic kidney disease    Diabetes mellitus    Diastolic CHF, chronic ([place])    a.  echo 2006 - ef 55-65%; mild diast dysfxn;    b. Echo 08/2011: Mild LVH, EF 60%;  c. 04/2013 Echo: EF 65-69%, mild conc LVH;  08/2014 Echo: EF 60-65%, mild-mod MR.   Gout    Hyperlipidemia    Hypertension    a.  Renal arterial Dopplers 12/2011: 1-59% right renal artery stenosis   Morbid obesity ([place])    Obstructive sleep apnea on CPAP    Paroxysmal Afib/Flutter    a. 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   Past Surgical History:  Past Surgical History:  Procedure Laterality Date   APPENDECTOMY     ATRIAL FLUTTER ABLATION N/A 09/24/2011   Procedure: ATRIAL FLUTTER ABLATION;  Surgeon: [name], MD;  [facility] C[facility] LAB;  Service: Cardiovascular;  Laterality: N/A;   CARDIOVERSION  10/22/2011   Procedure: CARDIOVERSION;  Surgeon: [name], MD;  Location: [place];  Service: Cardiovascular;  Laterality: N/A;   CARDIOVERSION N/A 09/10/2011   Procedure: CARDIOVERSION;  Surgeon: [name], MD;  Location: [facility] CATH LAB;  Service: Cardiovascular;  Laterality: N/A;   CHOLECYSTECTOMY     TONSILLECTOMY  1982   TOTAL ABDOMINAL HYSTERECTOMY     Social History:  reports that she has never smoked. She has never  used smokeless tobacco. She reports that she does not drink alcohol and does not use drugs. Family History:  Family History  Problem Relation Age of Onset   Heart disease Father    Hypertension Father    Breast cancer Sister    Cancer Sister        breast   Colon cancer Neg Hx    Esophageal cancer Neg Hx    Pancreatic cancer Neg Hx    Liver disease Neg Hx      HOME MEDICATIONS: Allergies as of 05/24/2021       Reactions   Metformin Diarrhea        Medication List        Accurate as of May 24, 2021 10:14 AM. If you have any questions, ask your nurse or doctor.          Accu-Chek Softclix Lancets lancets 1 each by Other route 3 (three)  times daily. as directed   allopurinol 300 MG tablet Commonly known as: ZYLOPRIM TAKE 1 TABLET(300 MG) BY MOUTH DAILY What changed: See the new instructions.   amiodarone 200 MG tablet Commonly known as: PACERONE Take 1 tablet (200 mg total) by mouth daily.   amoxicillin-clavulanate 500-125 MG tablet Commonly known as: AUGMENTIN Take 1 tablet by mouth 2 (two) times daily.   blood glucose meter kit and supplies Dispense based on patient and insurance preference. Use up to four times daily as directed. (FOR ICD-10 E10.9, E11.9). What changed:  how much to take how to take this when to take this   cloNIDine 0.3 MG tablet Commonly known as: CATAPRES TAKE 1 TABLET(0.3 MG) BY MOUTH TWICE DAILY What changed: See the new instructions.   diltiazem 30 MG tablet Commonly known as: CARDIZEM Take 30 mg by mouth See admin instructions. Take one tablet every 4 hours as needed for heart rate greater than 100 and blood pressure needs to be above 100   Eliquis 5 MG Tabs tablet Generic drug: apixaban TAKE 1 TABLET(5 MG) BY MOUTH TWICE DAILY What changed: See the new instructions.   FreeStyle Libre 14 Day Reader Kerrin Mo Use as directed.   FreeStyle Libre 14 Day Sensor Misc Use as directed to check blood sugar daily   HYDROcodone-acetaminophen 5-325 MG tablet Commonly known as: NORCO/VICODIN Take 1 tablet by mouth every 6 (six) hours as needed.   Insulin Lispro Prot & Lispro (75-25) 100 UNIT/ML Kwikpen Commonly known as: HumaLOG Mix 75/25 KwikPen Inject 15 Units into the skin 2 (two) times daily.   levothyroxine 75 MCG tablet Commonly known as: SYNTHROID Take 1 tablet (75 mcg total) by mouth daily before breakfast.   lidocaine 5 % Commonly known as: Lidoderm Place 1 patch onto the skin daily. Remove & Discard patch within 12 hours or as directed by MD   loperamide 2 MG capsule Commonly known as: IMODIUM Take 1 capsule (2 mg total) by mouth 4 (four) times daily as needed for  diarrhea or loose stools.   losartan 100 MG tablet Commonly known as: COZAAR Take 1 tablet (100 mg total) by mouth daily.   NovoFine Plus Pen Needle 32G X 4 MM Misc Generic drug: Insulin Pen Needle USE TWICE DAILY AS DIRECTED What changed:  how much to take how to take this when to take this additional instructions   Olopatadine HCl 0.2 % Soln Place 1 drop into the right eye daily.   OneTouch Verio test strip Generic drug: glucose blood 1 each by Other route 3 (three) times  daily.   Ozempic (1 MG/DOSE) 4 MG/3ML Sopn Generic drug: Semaglutide (1 MG/DOSE) INJECT 1MG INTO SKIN ONCE WEEKLY AS DIRECTED   potassium chloride 10 MEQ tablet Commonly known as: KLOR-CON Take 2 tablets (20 mEq total) by mouth daily.   Restasis MultiDose 0.05 % ophthalmic emulsion Generic drug: cycloSPORINE Place 1 drop into both eyes 2 (two) times daily.   rosuvastatin 10 MG tablet Commonly known as: CRESTOR TAKE 1 TABLET BY MOUTH IN  THE EVENING   Suprep Bowel Prep Kit 17.5-3.13-1.6 GM/177ML Soln Generic drug: Na Sulfate-K Sulfate-Mg Sulf Take 1 kit by mouth as directed.   torsemide 20 MG tablet Commonly known as: DEMADEX TAKE 2 TABLETS(40 MG) BY MOUTH DAILY What changed: See the new instructions.   triamcinolone cream 0.1 % Commonly known as: KENALOG Apply 1 application topically 2 (two) times daily.   verapamil 120 MG tablet Commonly known as: Calan Take 1 tablet (120 mg total) by mouth 2 (two) times daily. What changed:  when to take this additional instructions         OBJECTIVE:   Vital Signs: BP 136/82   Pulse 63   Ht 5' 1" (1.549 m)   Wt 251 lb (113.9 kg)   SpO2 97%   BMI 47.43 kg/m   Wt Readings from Last 3 Encounters:  05/24/21 251 lb (113.9 kg)  05/21/21 248 lb (112.5 kg)  04/05/21 253 lb (114.8 kg)     Exam: General: Pt appears well and is in NAD  Lungs: Clear with good BS bilat with no rales, rhonchi, or wheezes  Heart: RRR   Extremities: No  pretibial edema.   Neuro: MS is good with appropriate affect, pt is alert and Ox3    DM foot exam: 9/2/221     The skin of the feet is intact without sores or ulcerations. The pedal pulses are 2+ on right and 2+ on left. The sensation is intact to a screening 5.07, 10 gram monofilament bilaterally         DATA REVIEWED:  Lab Results  Component Value Date   HGBA1C 7.8 (A) 05/24/2021   HGBA1C 7.4 (A) 11/29/2020   HGBA1C 7.7 (H) 09/12/2020    Results for [name], [name] (MRN [id number]) as of 05/15/2021 07:23  Ref. Range 01/23/2021 15:40 01/25/2021 10:16  VITD Latest Ref Range: 30.00 - 100.00 ng/mL 23.37 (L) 21.09 (L)  Vitamin B12 Latest Ref Range: 211 - 911 pg/mL 328 327   ASSESSMENT / PLAN / RECOMMENDATIONS:   1) Type 2 Diabetes Mellitus, Sub-Optimally controlled, With Neuropathic  complications - Most recent A1c of 7.8 %. Goal A1c < 7.0 %.    -Patient continues with worsening hyperglycemia, this is because she is not on the Ozempic, she really could not give me a clear reason why she has not taken her Ozempic in the past 5 months. -Her PCP increase Ozempic to 1 mg, I have encouraged the patient to start taking Ozempic weekly -I am also going to increase her Humalog mix  - Intolerant to Metformin    MEDICATIONS: -Increase Humalog Mix 17 units with Breakfast and 17 units with Supper  -Start Ozempic 1 mg weekly    EDUCATION / INSTRUCTIONS: BG monitoring instructions: Patient is instructed to check her blood sugars 3 times a day, before meals . Call [place] [facility] if: BG persistently < 70 I reviewed the Rule of 15 for the treatment of hypoglycemia in detail with the patient. Literature supplied.    2) Diabetic complications:  Eye: Does not have known diabetic retinopathy.  Neuro/ Feet: Does have known diabetic peripheral neuropathy .  Renal: Patient does not have known baseline CKD. She   is on an ACEI/ARB at present.  3) Weakness/Hypokalemia   -Patient  has cushingoid features, she is not on any intra-articular injections or oral glucocorticoids -She has been noted with hypokalemia which most likely is related to diuretic use -We will proceed with saliva cortisol -She is unable to do 24-hour urine collection due to incontinence issues        F/U in 4 months    Signed electronically by: Mack Guise, MD  [facility]  [facility] [address] [place], [place] [address] Phone: [phone] FAX: [phone]   CC: Horald Pollen, MD [place] [address] Phone: [phone]  Fax: [phone]  Return to Endocrinology clinic as below: Future Appointments  Date Time Provider [place]  05/30/2021  9:30 AM Sherran Needs, NP [facility] None  07/05/2021 10:00 AM Marzetta Board, DPM [facility]  08/12/2021  3:30 PM [facility] CARE [facility] None  08/19/2021  3:30 PM [facility] CCM PHARMACIST 2 [facility] None  10/07/2021  9:30 AM Deneise Lever, MD [facility] None

## 2021-05-27 ENCOUNTER — Other Ambulatory Visit: Payer: Self-pay

## 2021-05-27 MED ORDER — AMIODARONE HCL 200 MG PO TABS: 200.0000 mg | ORAL_TABLET | Freq: Every day | ORAL | 2 refills | Status: DC

## 2021-05-28 ENCOUNTER — Telehealth: Payer: Self-pay | Admitting: Emergency Medicine

## 2021-05-28 NOTE — Telephone Encounter (Signed)
Additional Request for Penneedle, Nitrofurant, and insulin pump    Additional request for Torsemide, Verapamil

## 2021-05-30 ENCOUNTER — Other Ambulatory Visit: Payer: Self-pay

## 2021-05-30 ENCOUNTER — Other Ambulatory Visit: Payer: Medicare Other

## 2021-05-30 ENCOUNTER — Encounter (HOSPITAL_COMMUNITY): Payer: Self-pay | Admitting: Nurse Practitioner

## 2021-05-30 ENCOUNTER — Ambulatory Visit (HOSPITAL_COMMUNITY)
Admission: RE | Admit: 2021-05-30 | Discharge: 2021-05-30 | Disposition: A | Discharge status: Home / Self Care | Payer: Medicare Other | Source: Ambulatory Visit | Attending: Nurse Practitioner | Admitting: Nurse Practitioner

## 2021-05-30 VITALS — BP 142/70 | HR 56 | Ht 61.0 in | Wt 250.8 lb

## 2021-05-30 DIAGNOSIS — D6869 Other thrombophilia: Secondary | ICD-10-CM

## 2021-05-30 DIAGNOSIS — I480 Paroxysmal atrial fibrillation: Secondary | ICD-10-CM | POA: Diagnosis not present

## 2021-05-30 DIAGNOSIS — I5032 Chronic diastolic (congestive) heart failure: Secondary | ICD-10-CM | POA: Insufficient documentation

## 2021-05-30 DIAGNOSIS — Z7901 Long term (current) use of anticoagulants: Secondary | ICD-10-CM | POA: Insufficient documentation

## 2021-05-30 DIAGNOSIS — M25562 Pain in left knee: Secondary | ICD-10-CM | POA: Diagnosis not present

## 2021-05-30 DIAGNOSIS — Z794 Long term (current) use of insulin: Secondary | ICD-10-CM

## 2021-05-30 DIAGNOSIS — I4819 Other persistent atrial fibrillation: Secondary | ICD-10-CM

## 2021-05-30 DIAGNOSIS — Z8249 Family history of ischemic heart disease and other diseases of the circulatory system: Secondary | ICD-10-CM | POA: Diagnosis not present

## 2021-05-30 DIAGNOSIS — Z8616 Personal history of COVID-19: Secondary | ICD-10-CM | POA: Diagnosis not present

## 2021-05-30 DIAGNOSIS — Z79890 Hormone replacement therapy (postmenopausal): Secondary | ICD-10-CM | POA: Diagnosis not present

## 2021-05-30 DIAGNOSIS — Z79899 Other long term (current) drug therapy: Secondary | ICD-10-CM | POA: Diagnosis not present

## 2021-05-30 DIAGNOSIS — Z6841 Body Mass Index (BMI) 40.0 and over, adult: Secondary | ICD-10-CM | POA: Diagnosis not present

## 2021-05-30 DIAGNOSIS — R600 Localized edema: Secondary | ICD-10-CM | POA: Insufficient documentation

## 2021-05-30 DIAGNOSIS — E1165 Type 2 diabetes mellitus with hyperglycemia: Secondary | ICD-10-CM

## 2021-05-30 LAB — BRAIN NATRIURETIC PEPTIDE: B Natriuretic Peptide: 132.4 pg/mL — ABNORMAL HIGH (ref 0.0–100.0)

## 2021-05-30 MED ORDER — TORSEMIDE 20 MG PO TABS: 20.0000 mg | ORAL_TABLET | Freq: Every day | ORAL | Status: DC

## 2021-05-30 MED ORDER — VERAPAMIL HCL 120 MG PO TABS: 120.0000 mg | ORAL_TABLET | Freq: Every morning | ORAL | Status: DC

## 2021-05-30 NOTE — Progress Notes (Addendum)
Primary Care Physician: Horald Pollen, MD Referring Physician:[facility] triage   [name] is a 80 y.o. female with a h/o paroxysmal afib previously on flecainide, dofetilide and most recently on amiodarone. She is being seen today in the afib clinic. She is in SR but does report that she had a ER visit for afib that started around 4 am in the morning with HR's around 150 bpm. She  took her cardizem 120 mg at that time and again at 10  am. She  decided to go to the ER but by the time she was seen, she had returned to [place].   Overall her health has been stable. She had covid in January but she did not require hospitalization.  She remains on amiodarone 200 mg M-F only. TSH/Cmet checked in May  were in [place].  Remains on warfarin with CHA2DS2VASc score of at least 5. No bleeding issues. No further issues with afib.   F/u in afib clinic,07/18/20. She is here as she has had more  afib burden over the last month. SHe was initially seen by A. Lynnell Jude, NP, earlier in the month, amiodarone was increased to 200 mg bid M-Fand then set up for cardioversion. She  was in SR at time of cardioversion so it was cancelled. Then she noted more afib over the last 1-2 days. She was given an appointment here and presents today in [place]. She felt she may have converted late last night or early this am. No known trigger. She is not an ideal  ablation candidate 2/2 morbid obesity.   F/u in afib clinic,07/25/20. She is in afib at 108 bpm. She states that she went into afib Sunday night thru today. She was in SR when I saw her last. Even though her medical record showed she was on amio M-F, and on last visit, she was to take every day, she got confused and only took it M/W/F last week. She is having a very painful L knee that is limiting her ambulation and is to start PT next week.   F/u in afib clinic,08/01/20. She is in SR. She feels for like the last 3 days. She is now on amio 200 mg daily and cardizem 120 mg bid. She feels  improved. She has her ambulation limited by an arthritic knee and is to start PT 10/18. She has also noted a pruritic  rash LE's for the last 2 weeks. A cream that her PCP gave her was not helpful. No other rash. Denies any increased LLE.   F/u in afib clinic, 05/30/21. She  has not noted any afib. She has not been able to use her cpap lately as she had one tooth pulled on anticoagulation and has bleeding and swelling. This has mostly resolved but the rt side of her face is still tender. She has ongoing issues with pedal edema despite her regular torsemide use. She does have a pendulous abdomen which may contribute. She tries to elevate her legs and eats low salt. I discussed with her using knee high compression socks. I also talked to her about Alleviate HF trail and she is interested. She remains on amiodarone on amiodarone 200 mg daily and eliquis 5 mg bid for a CHA2DS2VASc  score of at least 5. Last echo showed preserved EF.  Her  symptoms correlate to NYHF class ll.    Today, she denies symptoms of palpitations, chest pain, shortness of breath, orthopnea, PND, lower extremity edema, dizziness, presyncope, syncope, or neurologic sequela.  The patient is tolerating medications without difficulties and is otherwise without complaint today.   Past Medical History:  Diagnosis Date   Arthritis    Back pain    Chronic anticoagulation    due to aflutter   Chronic kidney disease    Diabetes mellitus    Diastolic CHF, chronic ([place])    a.  echo 2006 - ef 55-65%; mild diast dysfxn;    b. Echo 08/2011: Mild LVH, EF 60%;  c. 04/2013 Echo: EF 65-69%, mild conc LVH;  08/2014 Echo: EF 60-65%, mild-mod MR.   Gout    Hyperlipidemia    Hypertension    a.  Renal arterial Dopplers 12/2011: 1-59% right renal artery stenosis   Morbid obesity ([place])    Obstructive sleep apnea on CPAP    Paroxysmal Afib/Flutter    a. 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   Past Surgical History:  Procedure Laterality Date    APPENDECTOMY     ATRIAL FLUTTER ABLATION N/A 09/24/2011   Procedure: ATRIAL FLUTTER ABLATION;  Surgeon: [name], MD;  Location: [facility] CATH LAB;  Service: Cardiovascular;  Laterality: N/A;   CARDIOVERSION  10/22/2011   Procedure: CARDIOVERSION;  Surgeon: [name], MD;  Location: [place];  Service: Cardiovascular;  Laterality: N/A;   CARDIOVERSION N/A 09/10/2011   Procedure: CARDIOVERSION;  Surgeon: [name], MD;  Location: [facility] CATH LAB;  Service: Cardiovascular;  Laterality: N/A;   CHOLECYSTECTOMY     TONSILLECTOMY  1982   TOTAL ABDOMINAL HYSTERECTOMY      Current Outpatient Medications  Medication Sig Dispense Refill   Accu-Chek Softclix Lancets lancets 1 each by Other route 3 (three) times daily. as directed 100 each 3   allopurinol (ZYLOPRIM) 300 MG tablet TAKE 1 TABLET(300 MG) BY MOUTH DAILY (Patient taking differently: Take 300 mg by mouth daily.) 90 tablet 3   amiodarone (PACERONE) 200 MG tablet Take 1 tablet (200 mg total) by mouth daily. 90 tablet 2   blood glucose meter kit and supplies Dispense based on patient and insurance preference. Use up to four times daily as directed. (FOR ICD-10 E10.9, E11.9). (Patient taking differently: 1 each by Other route See admin instructions. Dispense based on patient and insurance preference. Use up to four times daily as directed. (FOR ICD-10 E10.9, E11.9).) 1 each 0   cloNIDine (CATAPRES) 0.3 MG tablet TAKE 1 TABLET(0.3 MG) BY MOUTH TWICE DAILY (Patient taking differently: Take 0.3 mg by mouth 2 (two) times daily.) 180 tablet 3   Continuous Blood Gluc Receiver (FREESTYLE LIBRE 14 DAY READER) DEVI Use as directed. 1 each 0   Continuous Blood Gluc Sensor (FREESTYLE LIBRE 14 DAY SENSOR) MISC Use as directed to check blood sugar daily 2 each 3   diltiazem (CARDIZEM) 30 MG tablet Take 30 mg by mouth See admin instructions. Take one tablet every 4 hours as needed for heart rate greater than 100 and blood pressure needs to be above 100      ELIQUIS 5 MG TABS tablet TAKE 1 TABLET(5 MG) BY MOUTH TWICE DAILY (Patient taking differently: Take 5 mg by mouth 2 (two) times daily.) 180 tablet 1   HYDROcodone-acetaminophen (NORCO/VICODIN) 5-325 MG tablet Take 1 tablet by mouth every 6 (six) hours as needed.     Insulin Lispro Prot & Lispro (HUMALOG MIX 75/25 KWIKPEN) (75-25) 100 UNIT/ML Kwikpen Inject 17 Units into the skin 2 (two) times daily. 30 mL 3   levothyroxine (SYNTHROID) 75 MCG tablet Take 1 tablet (75 mcg total) by  mouth daily before breakfast. 90 tablet 1   lidocaine (LIDODERM) 5 % Place 1 patch onto the skin daily. Remove & Discard patch within 12 hours or as directed by MD 30 patch 0   loperamide (IMODIUM) 2 MG capsule Take 1 capsule (2 mg total) by mouth 4 (four) times daily as needed for diarrhea or loose stools. 12 capsule 0   losartan (COZAAR) 100 MG tablet Take 1 tablet (100 mg total) by mouth daily. 90 tablet 3   Na Sulfate-K Sulfate-Mg Sulf (SUPREP BOWEL PREP KIT) 17.5-3.13-1.6 GM/177ML SOLN Take 1 kit by mouth as directed. 324 mL 0   NOVOFINE PLUS PEN NEEDLE 32G X 4 MM MISC USE TWICE DAILY AS DIRECTED (Patient taking differently: 1 each by Other route as directed.) 100 each 11   Olopatadine HCl 0.2 % SOLN Place 1 drop into the right eye daily.     ONETOUCH VERIO test strip 1 each by Other route 3 (three) times daily.     potassium chloride (KLOR-CON) 10 MEQ tablet Take 2 tablets (20 mEq total) by mouth daily. 180 tablet 3   RESTASIS MULTIDOSE 0.05 % ophthalmic emulsion Place 1 drop into both eyes 2 (two) times daily.      rosuvastatin (CRESTOR) 10 MG tablet TAKE 1 TABLET BY MOUTH IN  THE EVENING (Patient taking differently: Take 10 mg by mouth every evening.) 90 tablet 3   Semaglutide, 1 MG/DOSE, (OZEMPIC, 1 MG/DOSE,) 4 MG/3ML SOPN Inject 1 mg into the skin once a week. 9 mL 3   torsemide (DEMADEX) 20 MG tablet TAKE 2 TABLETS(40 MG) BY MOUTH DAILY (Patient taking differently: Take 20 mg by mouth daily.) 180 tablet 3    triamcinolone cream (KENALOG) 0.1 % Apply 1 application topically 2 (two) times daily. 30 g 0   verapamil (CALAN) 120 MG tablet Take 1 tablet (120 mg total) by mouth 2 (two) times daily. (Patient taking differently: Take 120 mg by mouth daily. Taking once daily due to swelling in legs that she had when taken twice daily) 180 tablet 3   No current facility-administered medications for this encounter.    Allergies  Allergen Reactions   Metformin Diarrhea    Social History   Socioeconomic History   Marital status: Married    Spouse name: Not on file   Number of children: 3   Years of education: Not on file   Highest education level: Not on file  Occupational History   Occupation: DISABILITY/housewife    Employer: RETIRED  Tobacco Use   Smoking status: Never   Smokeless tobacco: Never  Vaping Use   Vaping Use: Never used  Substance and Sexual Activity   Alcohol use: No   Drug use: No   Sexual activity: Yes  Other Topics Concern   Not on file  Social History Narrative   Not on file   Social Determinants of Health   Financial Resource Strain: Not on file  Food Insecurity: No Food Insecurity   Worried About Running Out of Food in the Last Year: Never true   [place] in the Last Year: Never true  Transportation Needs: No Transportation Needs   Lack of Transportation (Medical): No   Lack of Transportation (Non-Medical): No  Physical Activity: Not on file  Stress: Not on file  Social Connections: Not on file  Intimate Partner Violence: Not on file    Family History  Problem Relation Age of Onset   Heart disease Father    Hypertension Father  Breast cancer Sister    Cancer Sister        breast   Colon cancer Neg Hx    Esophageal cancer Neg Hx    Pancreatic cancer Neg Hx    Liver disease Neg Hx     ROS- All systems are reviewed and negative except as per the HPI above  Physical Exam: Vitals:   05/30/21 0933  BP: (!) 142/70  Pulse: (!) 56  Weight:  113.8 kg  Height: '5\' 1"'  (1.549 m)   Wt Readings from Last 3 Encounters:  05/30/21 113.8 kg  05/24/21 113.9 kg  05/21/21 112.5 kg    Labs: Lab Results  Component Value Date   NA 141 03/12/2021   K 3.3 (L) 03/12/2021   CL 93 (L) 03/12/2021   CO2 31 (H) 03/12/2021   GLUCOSE 182 (H) 03/12/2021   BUN 16 03/12/2021   CREATININE 0.74 03/12/2021   CALCIUM 9.5 03/12/2021   MG 2.1 03/12/2021   Lab Results  Component Value Date   INR 2.0 06/15/2020   Lab Results  Component Value Date   CHOL 135 09/12/2020   HDL 62 09/12/2020   LDLCALC 57 09/12/2020   TRIG 84 09/12/2020     GEN- The patient is well appearing, alert and oriented x 3 today.   Head- normocephalic, atraumatic Eyes-  Sclera clear, conjunctiva pink Ears- hearing intact Oropharynx- clear Neck- supple, no JVP Lymph- no cervical lymphadenopathy Lungs- Clear to ausculation bilaterally, normal work of breathing Heart-  regular rate and rhythm, no murmurs, rubs or gallops, PMI not laterally displaced GI- soft, NT, ND, + BS Extremities- no clubbing, cyanosis, or edema MS- no significant deformity or atrophy Skin- no rash or lesion Psych- euthymic mood, full affect Neuro- strength and sensation are intact  EKG- sinus brady at 56 bpm, with first degree block, pr int 234 ms, qrs int 82 ms, qtc 482 ms  Epic records reviewed Echo-Study Conclusions   - Left ventricle: The cavity size was normal. Systolic function was   normal. The estimated ejection fraction was in the range of 60%   to 65%. Wall motion was normal; there were no regional wall   motion abnormalities. Features are consistent with a pseudonormal   left ventricular filling pattern, with concomitant abnormal   relaxation and increased filling pressure (grade 2 diastolic   dysfunction). Doppler parameters are consistent with high   ventricular filling pressure. - Aortic valve: Transvalvular velocity was within the normal range.   There was no stenosis.  There was no regurgitation. - Mitral valve: Transvalvular velocity was within the normal range.   There was no evidence for stenosis. There was mild regurgitation.   Valve area by pressure half-time: 1.79 cm^2. Valve area by   continuity equation (using LVOT flow): 1.1 cm^2. - Left atrium: The atrium was severely dilated. - Right ventricle: The cavity size was normal. Wall thickness was   normal. Systolic function was normal. - Tricuspid valve: There was trivial regurgitation. - Pulmonary arteries: Systolic pressure was within the normal   range. PA peak pressure: 25 mm Hg (S).    Assessment and Plan: 1. Paroxysmal afib Increased burden recently  Now back in rhythm with amiodarone daily at 200 mg ( previously M-F)  and  verapamil 120 mg daily  Can still use cardizem 30 mg as needed  Tsh/ cmet drawn in May of this year    2. Chadsvasc score of at least 5 Continue eliquis 5 mg bid   3.  LLE/diastolic dysfunction Continued LEE, class ll NYHF Continue torsemide Limit salt  Kne high support socks  Discussed [employer] Alleviate-HF study and pt is interested  Draw BNP today and update echo   4. Sleep apnea Continue cpap  F/u with Dr. Caryl Comes as scheduled in November  afib clinic as needed   Geroge Baseman. March Steyer, [facility] [address] [place], [place] [address] [phone]

## 2021-05-30 NOTE — Addendum Note (Signed)
Encounter addended by: Enid Derry, CMA on: 05/30/2021 12:07 PM  Actions taken: Order list changed, Pharmacy for encounter modified

## 2021-06-05 LAB — SALIVARY CORTISOL X2, TIMED
Salivary Cortisol 2nd Specimen: 0.057 ug/dL
Salivary Cortisol Baseline: 0.073 ug/dL

## 2021-06-06 DIAGNOSIS — Z794 Long term (current) use of insulin: Secondary | ICD-10-CM | POA: Diagnosis not present

## 2021-06-06 DIAGNOSIS — E1142 Type 2 diabetes mellitus with diabetic polyneuropathy: Secondary | ICD-10-CM | POA: Diagnosis not present

## 2021-06-07 ENCOUNTER — Encounter (HOSPITAL_COMMUNITY): Payer: Self-pay | Admitting: Gastroenterology

## 2021-06-07 ENCOUNTER — Other Ambulatory Visit: Payer: Self-pay

## 2021-06-10 ENCOUNTER — Encounter: Payer: Self-pay | Admitting: Internal Medicine

## 2021-06-11 NOTE — Telephone Encounter (Signed)
Pt called again stating that she found a ride that can take her tomorrow to procedure so she wants to keep her appt at the hospital. Since I was not able to reach you, I told pt that I would send this message and ask you to please call pt to confirm if she can still keep her appt tomorrow. It is ok to leave a detailed message if pt is unable to answer.

## 2021-06-11 NOTE — Telephone Encounter (Signed)
Return call to patient to verify procedure is still on the schedule for 06-13-2021 at 915am at [facility].  Patient would like Suprep resent to pharmacy as she has recently moved and misplaced prep.  Rx sent to pharmacy as requested.  Patient agreed to plan and verbalized understanding.  No further questions.

## 2021-06-11 NOTE — Telephone Encounter (Signed)
Patient called requesting to reschedule the procedure scheduled at the hospital due to her purchasing a home and the closing is tomorrow and she will not be able to prep.

## 2021-06-12 MED ORDER — NA SULFATE-K SULFATE-MG SULF 17.5-3.13-1.6 GM/177ML PO SOLN: 1.0000 | ORAL | 0 refills | Status: DC

## 2021-06-12 NOTE — Telephone Encounter (Signed)
Patient called states she went to the pharmacy to pick up her pep medication (Suprep) but they did not have the order. Please resend she is waiting at the pharmacy for it. Thanks

## 2021-06-12 NOTE — Addendum Note (Signed)
Addended by: Stevan Born on: 06/12/2021 10:50 AM   Modules accepted: Orders

## 2021-06-12 NOTE — Telephone Encounter (Signed)
Rx sent to pharmacy   

## 2021-06-12 NOTE — Anesthesia Preprocedure Evaluation (Addendum)
Anesthesia Evaluation  Patient identified by MRN, date of birth, ID band Patient awake    Reviewed: Allergy & Precautions, NPO status , Patient's Chart, lab work & pertinent test results  Airway Mallampati: II  TM Distance: >3 FB Neck ROM: Full  Mouth opening: Limited Mouth Opening  Dental no notable dental hx. (+) Partial Upper, Dental Advisory Given   Pulmonary neg pulmonary ROS, sleep apnea and Continuous Positive Airway Pressure Ventilation ,    Pulmonary exam normal breath sounds clear to auscultation       Cardiovascular hypertension, +CHF  Normal cardiovascular exam+ dysrhythmias Atrial Fibrillation  Rhythm:Irregular Rate:Normal     Neuro/Psych negative neurological ROS  negative psych ROS   GI/Hepatic Neg liver ROS, GERD  ,  Endo/Other  diabetes  Renal/GU      Musculoskeletal   Abdominal (+) + obese (BMI 46.86),   Peds  Hematology   Anesthesia Other Findings Metformin  Reproductive/Obstetrics                            Anesthesia Physical Anesthesia Plan  ASA: 3  Anesthesia Plan: MAC   Post-op Pain Management:    Induction:   PONV Risk Score and Plan: Treatment may vary due to age or medical condition  Airway Management Planned: Natural Airway and Nasal Cannula  Additional Equipment:   Intra-op Plan:   Post-operative Plan:   Informed Consent: I have reviewed the patients History and Physical, chart, labs and discussed the procedure including the risks, benefits and alternatives for the proposed anesthesia with the patient or authorized representative who has indicated his/her understanding and acceptance.     Dental advisory given  Plan Discussed with:   Anesthesia Plan Comments: (Colonoscopy For Hemata Chezia w MAC)       Anesthesia Quick Evaluation

## 2021-06-13 ENCOUNTER — Other Ambulatory Visit: Payer: Self-pay

## 2021-06-13 ENCOUNTER — Telehealth (HOSPITAL_COMMUNITY): Payer: Self-pay

## 2021-06-13 ENCOUNTER — Encounter (HOSPITAL_COMMUNITY)
Admission: RE | Disposition: A | Discharge status: Home / Self Care | Payer: Self-pay | Source: Home / Self Care | Attending: Gastroenterology

## 2021-06-13 ENCOUNTER — Ambulatory Visit (HOSPITAL_COMMUNITY)
Admission: RE | Admit: 2021-06-13 | Discharge: 2021-06-13 | Disposition: A | Discharge status: Home / Self Care | Payer: Medicare Other | Attending: Gastroenterology | Admitting: Gastroenterology

## 2021-06-13 ENCOUNTER — Telehealth: Payer: Self-pay | Admitting: Gastroenterology

## 2021-06-13 ENCOUNTER — Ambulatory Visit (HOSPITAL_COMMUNITY): Payer: Medicare Other | Admitting: Anesthesiology

## 2021-06-13 ENCOUNTER — Encounter (HOSPITAL_COMMUNITY): Payer: Self-pay | Admitting: Gastroenterology

## 2021-06-13 DIAGNOSIS — K621 Rectal polyp: Secondary | ICD-10-CM

## 2021-06-13 DIAGNOSIS — K625 Hemorrhage of anus and rectum: Secondary | ICD-10-CM | POA: Diagnosis not present

## 2021-06-13 DIAGNOSIS — Z888 Allergy status to other drugs, medicaments and biological substances status: Secondary | ICD-10-CM | POA: Insufficient documentation

## 2021-06-13 DIAGNOSIS — G4733 Obstructive sleep apnea (adult) (pediatric): Secondary | ICD-10-CM | POA: Diagnosis not present

## 2021-06-13 DIAGNOSIS — Z9989 Dependence on other enabling machines and devices: Secondary | ICD-10-CM | POA: Diagnosis not present

## 2021-06-13 DIAGNOSIS — Z9049 Acquired absence of other specified parts of digestive tract: Secondary | ICD-10-CM | POA: Diagnosis not present

## 2021-06-13 DIAGNOSIS — I700 Atherosclerosis of aorta: Secondary | ICD-10-CM | POA: Diagnosis not present

## 2021-06-13 DIAGNOSIS — I4819 Other persistent atrial fibrillation: Secondary | ICD-10-CM | POA: Diagnosis not present

## 2021-06-13 DIAGNOSIS — D128 Benign neoplasm of rectum: Secondary | ICD-10-CM | POA: Diagnosis not present

## 2021-06-13 DIAGNOSIS — K921 Melena: Secondary | ICD-10-CM

## 2021-06-13 HISTORY — PX: POLYPECTOMY: SHX5525

## 2021-06-13 HISTORY — PX: COLONOSCOPY WITH PROPOFOL: SHX5780

## 2021-06-13 LAB — GLUCOSE, CAPILLARY: Glucose-Capillary: 140 mg/dL — ABNORMAL HIGH (ref 70–99)

## 2021-06-13 SURGERY — COLONOSCOPY WITH PROPOFOL
Anesthesia: Monitor Anesthesia Care

## 2021-06-13 MED ORDER — PROPOFOL 10 MG/ML IV BOLUS
INTRAVENOUS | Status: DC | PRN
  Administered 2021-06-13: 30 mg via INTRAVENOUS

## 2021-06-13 MED ORDER — PROPOFOL 500 MG/50ML IV EMUL
INTRAVENOUS | Status: DC | PRN
  Administered 2021-06-13: 120 ug/kg/min via INTRAVENOUS

## 2021-06-13 MED ORDER — SODIUM CHLORIDE 0.9 % IV SOLN: INTRAVENOUS | Status: DC

## 2021-06-13 MED ORDER — DILTIAZEM HCL 30 MG PO TABS: 30.0000 mg | ORAL_TABLET | ORAL | 2 refills | Status: DC

## 2021-06-13 MED ORDER — LACTATED RINGERS IV SOLN
INTRAVENOUS | Status: DC
  Administered 2021-06-13: 1000 mL via INTRAVENOUS

## 2021-06-13 MED ORDER — LIDOCAINE HCL (CARDIAC) PF 100 MG/5ML IV SOSY
PREFILLED_SYRINGE | INTRAVENOUS | Status: DC | PRN
  Administered 2021-06-13: 100 mg via INTRAVENOUS

## 2021-06-13 SURGICAL SUPPLY — 22 items

## 2021-06-13 NOTE — H&P (Signed)
HPI: This is a woman with rectal bleeding in setting of chronic blood thinners, Hb 11.4, normocytic  ROS: complete GI ROS as described in HPI, all other review negative.  Constitutional:  No unintentional weight loss   Past Medical History:  Diagnosis Date   Arthritis    Back pain    Chronic anticoagulation    due to aflutter   Chronic kidney disease    Diabetes mellitus    Diastolic CHF, chronic ([place])    a.  echo 2006 - ef 55-65%; mild diast dysfxn;    b. Echo 08/2011: Mild LVH, EF 60%;  c. 04/2013 Echo: EF 65-69%, mild conc LVH;  08/2014 Echo: EF 60-65%, mild-mod MR.   Gout    Hyperlipidemia    Hypertension    a.  Renal arterial Dopplers 12/2011: 1-59% right renal artery stenosis   Morbid obesity ([place])    Obstructive sleep apnea on CPAP    Paroxysmal Afib/Flutter    a. 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    Past Surgical History:  Procedure Laterality Date   APPENDECTOMY     ATRIAL FLUTTER ABLATION N/A 09/24/2011   Procedure: ATRIAL FLUTTER ABLATION;  Surgeon: [name], MD;  Location: [facility] CATH LAB;  Service: Cardiovascular;  Laterality: N/A;   CARDIOVERSION  10/22/2011   Procedure: CARDIOVERSION;  Surgeon: [name], MD;  Location: [place];  Service: Cardiovascular;  Laterality: N/A;   CARDIOVERSION N/A 09/10/2011   Procedure: CARDIOVERSION;  Surgeon: [name], MD;  Location: [facility] CATH LAB;  Service: Cardiovascular;  Laterality: N/A;   CHOLECYSTECTOMY     TONSILLECTOMY  1982   TOTAL ABDOMINAL HYSTERECTOMY      Current Facility-Administered Medications  Medication Dose Route Frequency Provider Last Rate Last Admin   0.9 %  sodium chloride infusion   Intravenous Continuous Milus Banister, MD       lactated ringers infusion   Intravenous Continuous Milus Banister, MD 10 mL/hr at 06/13/21 [id number] Continued from Pre-op at 06/13/21 0916    Allergies as of 02/27/2021 - Review Complete 02/27/2021  Allergen Reaction Noted   Metformin Diarrhea 12/10/2015     Family History  Problem Relation Age of Onset   Heart disease Father    Hypertension Father    Breast cancer Sister    Cancer Sister        breast   Colon cancer Neg Hx    Esophageal cancer Neg Hx    Pancreatic cancer Neg Hx    Liver disease Neg Hx     Social History   Socioeconomic History   Marital status: Married    Spouse name: Not on file   Number of children: 3   Years of education: Not on file   Highest education level: Not on file  Occupational History   Occupation: DISABILITY/housewife    Employer: RETIRED  Tobacco Use   Smoking status: Never   Smokeless tobacco: Never  Vaping Use   Vaping Use: Never used  Substance and Sexual Activity   Alcohol use: No   Drug use: No   Sexual activity: Yes  Other Topics Concern   Not on file  Social History Narrative   Not on file   Social Determinants of Health   Financial Resource Strain: Not on file  Food Insecurity: No Food Insecurity   Worried About Running Out of Food in the Last Year: Never true   [place] in the Last Year: Never true  Transportation Needs: No  Transportation Needs   Lack of Transportation (Medical): No   Lack of Transportation (Non-Medical): No  Physical Activity: Not on file  Stress: Not on file  Social Connections: Not on file  Intimate Partner Violence: Not on file     Physical Exam: BP (!) 156/47   Pulse 74   Temp 98.1 F (36.7 C) (Tympanic)   Resp 18   Ht '5\' 1"'$  (1.549 m)   Wt 112.5 kg   SpO2 98%   BMI 46.86 kg/m  Constitutional: generally well-appearing Psychiatric: alert and oriented x3 Abdomen: soft, nontender, nondistended, no obvious ascites, no peritoneal signs, normal bowel sounds No peripheral edema noted in lower extremities  Assessment and plan: 80 y.o. female with rectal bleeding in setting of chronic blood thinners, Hb 11.4, normocytic  Colonoscopy today  Please see the "Patient Instructions" section for addition details about the plan.  Owens Loffler, MD [facility] 06/13/2021, 9:24 AM

## 2021-06-13 NOTE — Anesthesia Postprocedure Evaluation (Signed)
Anesthesia Post Note  Patient: Insurance claims handler  Procedure(s) Performed: COLONOSCOPY WITH PROPOFOL POLYPECTOMY     Patient location during evaluation: Endoscopy Anesthesia Type: MAC Level of consciousness: awake and alert Pain management: pain level controlled Vital Signs Assessment: post-procedure vital signs reviewed and stable Respiratory status: spontaneous breathing, nonlabored ventilation, respiratory function stable and patient connected to nasal cannula oxygen Cardiovascular status: blood pressure returned to baseline and stable Postop Assessment: no apparent nausea or vomiting Anesthetic complications: no   No notable events documented.  Last Vitals:  Vitals:   06/13/21 1010 06/13/21 1020  BP: (!) 144/64 (!) 165/90  Pulse: 97 99  Resp: 16 (!) 26  Temp:    SpO2: 97% 97%    Last Pain:  Vitals:   06/13/21 1005  TempSrc: Oral  PainSc: 0-No pain                 Barnet Glasgow

## 2021-06-13 NOTE — Discharge Instructions (Signed)

## 2021-06-13 NOTE — Telephone Encounter (Signed)
Patient called and states she is currently in A-fib. She was told it is not uncommon to have A-fib post Colonoscopy. Per [name] Instructed patient to take her Verapamil '120mg'$  this evening. Advised her to wait 2 hours and to check her blood pressure and heart rate and if she is still currently in A-fib she was told to take her Diltiazem '30mg'$  tablet. Consulted with her regarding her medications and she understands to only take her Verapamil '120mg'$  tablet once daily in the evening. I will send in her Diltiazem '30mg'$  tablet to her pharmacy- 15 tablets with 2 Refills. Consulted with patient and she verbalized understanding.

## 2021-06-13 NOTE — Op Note (Signed)
[facility] Patient Name: [name] Procedure Date: 06/13/2021 MRN: [id number] Attending MD: Milus Banister , MD Date of Birth: 1941-08-30 CSN: [id number] Age: 80 Admit Type: Outpatient Procedure:                Colonoscopy Indications:              Rectal bleeding, minor, in the setting of chronic                            blood thinner Providers:                Milus Banister, MD, Doristine Johns, RN, Cherylynn Ridges, Technician, CRNA Theodosia Quay Referring MD:              Medicines:                Monitored Anesthesia Care Complications:            No immediate complications. Estimated blood loss:                            None. Estimated Blood Loss:     Estimated blood loss: none. Procedure:                Pre-Anesthesia Assessment:                           - Prior to the procedure, a History and Physical                            was performed, and patient medications and                            allergies were reviewed. The patient's tolerance of                            previous anesthesia was also reviewed. The risks                            and benefits of the procedure and the sedation                            options and risks were discussed with the patient.                            All questions were answered, and informed consent                            was obtained. Prior Anticoagulants: The patient has                            taken Eliquis (apixaban), last dose was 3 days  prior to procedure. ASA Grade Assessment: IV - A                            patient with severe systemic disease that is a                            constant threat to life. After reviewing the risks                            and benefits, the patient was deemed in                            satisfactory condition to undergo the procedure.                           After obtaining informed consent,  the colonoscope                            was passed under direct vision. Throughout the                            procedure, the patient's blood pressure, pulse, and                            oxygen saturations were monitored continuously. The                            [id number]) Olympus colonoscope was                            introduced through the anus and advanced to the the                            cecum, identified by appendiceal orifice and                            ileocecal valve. The colonoscopy was performed                            without difficulty. The patient tolerated the                            procedure well. The quality of the bowel                            preparation was adequate. The ileocecal valve,                            appendiceal orifice, and rectum were photographed. Scope In: 9:38:29 AM Scope Out: 10:02:58 AM Scope Withdrawal Time: 0 hours 14 minutes 40 seconds  Total Procedure Duration: 0 hours 24 minutes 29 seconds  Findings:      A 10 mm polyp was found in the rectum. The polyp was semi-pedunculated.  The polyp was removed with a hot snare. Resection and retrieval were       complete.      The exam was otherwise without abnormality on direct and retroflexion       views. Impression:               - One 10 mm polyp in the rectum, removed with a hot                            snare. Resected and retrieved.                           - The examination was otherwise normal on direct                            and retroflexion views. Moderate Sedation:      Not Applicable - Patient had care per Anesthesia. Recommendation:           - Patient has a contact number available for                            emergencies. The signs and symptoms of potential                            delayed complications were discussed with the                            patient. Return to normal activities tomorrow.                             Written discharge instructions were provided to the                            patient.                           - Resume previous diet.                           - Continue present medications.                           - Await pathology results. Procedure Code(s):        --- Professional ---                           [phone], Colonoscopy, flexible; with removal of                            tumor(s), polyp(s), or other lesion(s) by snare                            technique Diagnosis Code(s):        --- Professional ---                           K62.1, Rectal polyp  K62.5, Hemorrhage of anus and rectum CPT copyright 2019 American Medical Association. All rights reserved. The codes documented in this report are preliminary and upon coder review may  be revised to meet current compliance requirements. Milus Banister, MD 06/13/2021 10:10:57 AM This report has been signed electronically. Number of Addenda: 0

## 2021-06-13 NOTE — Transfer of Care (Signed)
Immediate Anesthesia Transfer of Care Note  Patient: [name]  Procedure(s) Performed: COLONOSCOPY WITH PROPOFOL POLYPECTOMY  Patient Location: Endoscopy Unit  Anesthesia Type:MAC  Level of Consciousness: drowsy  Airway & Oxygen Therapy: Patient Spontanous Breathing  Post-op Assessment: Report given to RN and Post -op Vital signs reviewed and stable  Post vital signs: Reviewed and stable  Last Vitals:  Vitals Value Taken Time  BP 148/58   Temp    Pulse 94 06/13/21 1008  Resp 16 06/13/21 1008  SpO2 98 % 06/13/21 1008  Vitals shown include unvalidated device data.  Last Pain:  Vitals:   06/13/21 0853  TempSrc: Tympanic  PainSc: 0-No pain         Complications: No notable events documented.

## 2021-06-13 NOTE — Addendum Note (Signed)
Encounter addended by: Sherran Needs, NP on: 06/13/2021 5:05 PM  Actions taken: Clinical Note Signed

## 2021-06-13 NOTE — Telephone Encounter (Signed)
Inbound call from patient. Procedure 8/25 at [facility]. Wants to know when to resume BT

## 2021-06-14 ENCOUNTER — Other Ambulatory Visit: Payer: Self-pay

## 2021-06-14 ENCOUNTER — Encounter (HOSPITAL_COMMUNITY): Payer: Self-pay | Admitting: Gastroenterology

## 2021-06-14 ENCOUNTER — Other Ambulatory Visit: Payer: Self-pay | Admitting: Internal Medicine

## 2021-06-14 ENCOUNTER — Other Ambulatory Visit (HOSPITAL_COMMUNITY): Payer: Medicare Other

## 2021-06-14 ENCOUNTER — Other Ambulatory Visit: Payer: Self-pay | Admitting: Emergency Medicine

## 2021-06-14 DIAGNOSIS — I10 Essential (primary) hypertension: Secondary | ICD-10-CM

## 2021-06-14 LAB — SURGICAL PATHOLOGY

## 2021-06-14 NOTE — Telephone Encounter (Signed)
The pt has been advised that she can restart her Eliquis today.  The pt has been advised of the information and verbalized understanding.

## 2021-06-14 NOTE — Telephone Encounter (Signed)
Dr Ardis Hughs please review.  Pt had colon yesterday and she wants to know when she can resume Eliquis.

## 2021-06-14 NOTE — Telephone Encounter (Signed)
today

## 2021-06-15 MED ORDER — APIXABAN 5 MG PO TABS: ORAL_TABLET | ORAL | 1 refills | Status: DC

## 2021-06-17 NOTE — Telephone Encounter (Signed)
Pt last saw Roderic Palau, NP on 05/30/21, last labs 03/12/21 Creat 0.74, age 80, weight 112.5kg, based on specified criteria pt is on appropriate dosage of Eliquis '5mg'$  BID.  Will refill rx.

## 2021-06-18 ENCOUNTER — Ambulatory Visit (HOSPITAL_COMMUNITY)
Admission: RE | Admit: 2021-06-18 | Discharge: 2021-06-18 | Disposition: A | Discharge status: Home / Self Care | Payer: Medicare Other | Source: Ambulatory Visit | Attending: Nurse Practitioner | Admitting: Nurse Practitioner

## 2021-06-18 ENCOUNTER — Other Ambulatory Visit: Payer: Self-pay

## 2021-06-18 DIAGNOSIS — I517 Cardiomegaly: Secondary | ICD-10-CM | POA: Diagnosis not present

## 2021-06-18 DIAGNOSIS — I4819 Other persistent atrial fibrillation: Secondary | ICD-10-CM | POA: Insufficient documentation

## 2021-06-18 LAB — ECHOCARDIOGRAM COMPLETE
AR max vel: 1.73 cm2
AV Area VTI: 1.64 cm2
AV Area mean vel: 1.64 cm2
AV Mean grad: 8.5 mmHg
AV Peak grad: 15.9 mmHg
Ao pk vel: 2 m/s
Area-P 1/2: 2.83 cm2
S' Lateral: 3.3 cm

## 2021-06-18 MED ORDER — AMLODIPINE BESYLATE 5 MG PO TABS: 5.0000 mg | ORAL_TABLET | Freq: Every day | ORAL | 0 refills | Status: DC

## 2021-06-18 MED ORDER — CYCLOBENZAPRINE HCL 5 MG PO TABS: 5.0000 mg | ORAL_TABLET | ORAL | 0 refills | Status: DC | PRN

## 2021-06-18 NOTE — Telephone Encounter (Signed)
Patient is requesting a refill of the following medications: Cyclobenzaprine 5 mg.    Date of patient request: 06/14/21  Last office visit: 05/21/21 Last refill amount: 30,0 Follow up time period per chart: n/a

## 2021-06-18 NOTE — Progress Notes (Signed)
  Echocardiogram 2D Echocardiogram has been performed.  Merrie Roof F 06/18/2021, 10:16 AM

## 2021-06-18 NOTE — Addendum Note (Signed)
Addended by: Durwin Nora on: 06/18/2021 09:26 AM   Modules accepted: Orders

## 2021-06-20 ENCOUNTER — Encounter (HOSPITAL_COMMUNITY): Payer: Self-pay | Admitting: *Deleted

## 2021-06-21 ENCOUNTER — Telehealth: Payer: Self-pay

## 2021-06-23 ENCOUNTER — Telehealth: Payer: Self-pay | Admitting: Emergency Medicine

## 2021-06-23 ENCOUNTER — Telehealth: Payer: Self-pay

## 2021-06-23 DIAGNOSIS — E1159 Type 2 diabetes mellitus with other circulatory complications: Secondary | ICD-10-CM

## 2021-06-23 DIAGNOSIS — M109 Gout, unspecified: Secondary | ICD-10-CM

## 2021-06-25 ENCOUNTER — Telehealth: Payer: Self-pay | Admitting: Emergency Medicine

## 2021-06-25 NOTE — Telephone Encounter (Signed)
Patient is experiencing painful, burning urination since 06/24/21, last night got worse, after urination feels pressure  Does she need to come give a urine sample  Patient states that she is not seeing the urologist until later this month  Please call the patient at [phone]

## 2021-06-26 ENCOUNTER — Telehealth: Payer: Self-pay | Admitting: Internal Medicine

## 2021-06-26 ENCOUNTER — Other Ambulatory Visit: Payer: Self-pay | Admitting: Emergency Medicine

## 2021-06-26 MED ORDER — CEFUROXIME AXETIL 500 MG PO TABS: 500.0000 mg | ORAL_TABLET | Freq: Two times a day (BID) | ORAL | 0 refills | Status: AC

## 2021-06-26 NOTE — Telephone Encounter (Signed)
Dr. Annamaria Boots you have a 4:30 RNA slot for tomorrow can we use that slot? The APP's are booked up?

## 2021-06-26 NOTE — Telephone Encounter (Signed)
Called and spoke with patient to let her know the recs from Dr. Annamaria Boots. She states that the pain in her chest comes and goes and it is not effecting her breathing. Advised her that I do not have an appointments available for this week with Dr. Annamaria Boots or APP but could get her in next week. Patient wanted to get scheduled for  follow up. She is now scheduled for next week with Geraldo Pitter. Advised patient is symptoms got worse and started effecting her breathing to go to Urgent care or ED. She expressed understanding. Nothing further needed at this time.

## 2021-06-26 NOTE — Telephone Encounter (Signed)
Nope- just gave that 4:30 slot to Amy. Suggest patient go to Urgent Care or ED for pleuritic chest pain.

## 2021-06-26 NOTE — Telephone Encounter (Signed)
Called and spoke with Patient.  Patient stated she had 3 dental surgeries in July and was unable to wear her cpap mask. Patient stated her face is still swollen and she still can not tolerate wearing a cpap. Patient stated the last 4 days she has been having right side chest discomfort that come and go.  Patient stated right side chest discomfort  is a stabbing, spasm pain that comes and goes with movement. Patient stated she has tried anti gas remedies,but continued with discomfort.   Message routed to Dr. Annamaria Boots to advise   Allergies  Allergen Reactions   Metformin Diarrhea   Current Outpatient Medications on File Prior to Visit  Medication Sig Dispense Refill   Accu-Chek Softclix Lancets lancets 1 each by Other route 3 (three) times daily. as directed 100 each 3   allopurinol (ZYLOPRIM) 300 MG tablet TAKE 1 TABLET(300 MG) BY MOUTH DAILY (Patient taking differently: Take 300 mg by mouth daily.) 90 tablet 3   amiodarone (PACERONE) 200 MG tablet Take 1 tablet (200 mg total) by mouth daily. 90 tablet 2   amLODipine (NORVASC) 5 MG tablet Take 1 tablet (5 mg total) by mouth daily. 90 tablet 0   blood glucose meter kit and supplies Dispense based on patient and insurance preference. Use up to four times daily as directed. (FOR ICD-10 E10.9, E11.9). (Patient taking differently: 1 each by Other route See admin instructions. Dispense based on patient and insurance preference. Use up to four times daily as directed. (FOR ICD-10 E10.9, E11.9).) 1 each 0   cloNIDine (CATAPRES) 0.3 MG tablet TAKE 1 TABLET(0.3 MG) BY MOUTH TWICE DAILY (Patient taking differently: Take 0.3 mg by mouth 2 (two) times daily.) 180 tablet 3   Continuous Blood Gluc Receiver (FREESTYLE LIBRE 14 DAY READER) DEVI Use as directed. 1 each 0   Continuous Blood Gluc Sensor (FREESTYLE LIBRE 14 DAY SENSOR) MISC Use as directed to check blood sugar daily 2 each 3   cyclobenzaprine (FLEXERIL) 5 MG tablet Take 1 tablet (5 mg total) by mouth as  needed for muscle spasms. 30 tablet 0   diltiazem (CARDIZEM) 30 MG tablet Take 1 tablet (30 mg total) by mouth See admin instructions. Take one tablet every 4 hours as needed for heart rate greater than 100 and blood pressure needs to be above 100 15 tablet 2   ELIQUIS 5 MG TABS tablet TAKE 1 TABLET(5 MG) BY MOUTH TWICE DAILY 180 tablet 1   Insulin Lispro Prot & Lispro (HUMALOG MIX 75/25 KWIKPEN) (75-25) 100 UNIT/ML Kwikpen Inject 17 Units into the skin 2 (two) times daily. 30 mL 3   levothyroxine (SYNTHROID) 75 MCG tablet Take 1 tablet (75 mcg total) by mouth daily before breakfast. 90 tablet 1   lidocaine (LIDODERM) 5 % Place 1 patch onto the skin daily. Remove & Discard patch within 12 hours or as directed by MD (Patient taking differently: Place 1 patch onto the skin daily as needed (Pain). Remove & Discard patch within 12 hours or as directed by MD) 30 patch 0   loperamide (IMODIUM) 2 MG capsule Take 1 capsule (2 mg total) by mouth 4 (four) times daily as needed for diarrhea or loose stools. 12 capsule 0   losartan (COZAAR) 100 MG tablet Take 1 tablet (100 mg total) by mouth daily. 90 tablet 3   Na Sulfate-K Sulfate-Mg Sulf (SUPREP BOWEL PREP KIT) 17.5-3.13-1.6 GM/177ML SOLN Take 1 kit by mouth as directed. 324 mL 0   NOVOFINE PLUS PEN NEEDLE  32G X 4 MM MISC USE TWICE DAILY AS DIRECTED (Patient taking differently: 1 each by Other route as directed.) 100 each 11   Olopatadine HCl 0.2 % SOLN Place 1 drop into the right eye daily.     ONETOUCH VERIO test strip 1 each by Other route 3 (three) times daily.     potassium chloride (KLOR-CON) 10 MEQ tablet Take 2 tablets (20 mEq total) by mouth daily. 180 tablet 3   RESTASIS MULTIDOSE 0.05 % ophthalmic emulsion Place 1 drop into both eyes 2 (two) times daily.      rosuvastatin (CRESTOR) 10 MG tablet TAKE 1 TABLET BY MOUTH IN  THE EVENING (Patient taking differently: Take 10 mg by mouth every evening.) 90 tablet 3   Semaglutide, 1 MG/DOSE, (OZEMPIC, 1  MG/DOSE,) 4 MG/3ML SOPN Inject 1 mg into the skin once a week. 9 mL 3   torsemide (DEMADEX) 20 MG tablet Take 1 tablet (20 mg total) by mouth daily.     triamcinolone cream (KENALOG) 0.1 % Apply 1 application topically 2 (two) times daily. (Patient taking differently: Apply 1 application topically daily as needed (Rash under breast).) 30 g 0   verapamil (CALAN) 120 MG tablet Take 1 tablet (120 mg total) by mouth in the morning.     No current facility-administered medications on file prior to visit.

## 2021-06-26 NOTE — Telephone Encounter (Signed)
Yes.  I will send one to her pharmacy of record.  Ceftin 500 mg twice a day for 7 days.  Thanks.

## 2021-06-26 NOTE — Telephone Encounter (Signed)
Best would be to do to Urgent Care or ED if we can't get her in in next day or 2/

## 2021-06-28 NOTE — Telephone Encounter (Signed)
Called pt to let her know that a prescription was sent to the pharmacy, she verb understanding.

## 2021-07-02 ENCOUNTER — Ambulatory Visit: Payer: Medicare Other | Admitting: Primary Care

## 2021-07-02 MED ORDER — CLONIDINE HCL 0.3 MG PO TABS: 0.3000 mg | ORAL_TABLET | Freq: Two times a day (BID) | ORAL | 3 refills | Status: DC

## 2021-07-02 MED ORDER — ALLOPURINOL 300 MG PO TABS: ORAL_TABLET | ORAL | 3 refills | Status: DC

## 2021-07-02 NOTE — Telephone Encounter (Signed)
Called and spoke with pt, she states that she needed a refill on clonidine and allopurinol sent to [facility]. Pt states she dose not a refill on insulin pump or pen needles, that [facility] is requesting.

## 2021-07-04 ENCOUNTER — Other Ambulatory Visit: Payer: Self-pay

## 2021-07-04 ENCOUNTER — Ambulatory Visit (INDEPENDENT_AMBULATORY_CARE_PROVIDER_SITE_OTHER): Payer: Medicare Other

## 2021-07-04 ENCOUNTER — Ambulatory Visit (INDEPENDENT_AMBULATORY_CARE_PROVIDER_SITE_OTHER): Payer: Medicare Other | Admitting: Adult Health

## 2021-07-04 ENCOUNTER — Encounter: Payer: Self-pay | Admitting: Adult Health

## 2021-07-04 VITALS — BP 130/88 | HR 71 | Temp 97.6°F | Ht 61.0 in | Wt 246.2 lb

## 2021-07-04 DIAGNOSIS — R091 Pleurisy: Secondary | ICD-10-CM | POA: Diagnosis not present

## 2021-07-04 DIAGNOSIS — G4733 Obstructive sleep apnea (adult) (pediatric): Secondary | ICD-10-CM | POA: Diagnosis not present

## 2021-07-04 DIAGNOSIS — J209 Acute bronchitis, unspecified: Secondary | ICD-10-CM

## 2021-07-04 DIAGNOSIS — J984 Other disorders of lung: Secondary | ICD-10-CM

## 2021-07-04 DIAGNOSIS — J440 Chronic obstructive pulmonary disease with acute lower respiratory infection: Secondary | ICD-10-CM | POA: Diagnosis not present

## 2021-07-04 DIAGNOSIS — Z9989 Dependence on other enabling machines and devices: Secondary | ICD-10-CM | POA: Diagnosis not present

## 2021-07-04 DIAGNOSIS — R918 Other nonspecific abnormal finding of lung field: Secondary | ICD-10-CM | POA: Diagnosis not present

## 2021-07-04 NOTE — Assessment & Plan Note (Signed)
Patient is encouraged on CPAP compliance.  Plan  Patient Instructions  Restart CPAP At bedtime   Wear all night long  Healthy sleep regimen  Do not drive if sleepy .  Work on healthy weight loss.   Warm heat to back and shoulder .   Chest xray today .   Follow up with dentist as planned.   Follow up with Dr. Annamaria Boots  in 6 months and As needed

## 2021-07-04 NOTE — Patient Instructions (Signed)
Restart CPAP At bedtime   Wear all night long  Healthy sleep regimen  Do not drive if sleepy .  Work on healthy weight loss.   Warm heat to back and shoulder .   Chest xray today .   Follow up with dentist as planned.   Follow up with Dr. Annamaria Boots  in 6 months and As needed

## 2021-07-04 NOTE — Assessment & Plan Note (Signed)
Healthy weight loss 

## 2021-07-04 NOTE — Assessment & Plan Note (Signed)
Questionable pleurisy versus musculoskeletal in nature. Check chest x-ray today.  Advised patient to put warm heat along the shoulder and back  Plan  Patient Instructions  Restart CPAP At bedtime   Wear all night long  Healthy sleep regimen  Do not drive if sleepy .  Work on healthy weight loss.   Warm heat to back and shoulder .   Chest xray today .   Follow up with dentist as planned.   Follow up with Dr. Annamaria Boots  in 6 months and As needed

## 2021-07-04 NOTE — Progress Notes (Signed)
'@Patient'  ID: [name], female    DOB: 04-25-1941, 80 y.o.   MRN: [id number]  Chief Complaint  Patient presents with   Follow-up    Referring provider: Horald Pollen, *  HPI: 80 yo female never smoker followed for OSA  Medical history DCHF , PAF on Amiodarone /Eliquis , Fatty liver disease   TEST/EVENTS :  NPSG 06/05/11-AHI 20.1/ hr, desaturation to  87%, body weight 261 lbs, CPAP 10   07/04/2021 Follow up : OSA  Patient presents for a follow-up visit.  Patient has underlying moderate obstructive sleep apnea.  Patient says over the last few months has not been able to wear her CPAP.  She had dental surgery in July and is had a lot of dental pain afterwards and swelling in the gumline.  Swelling has gotten better but is still tender to the area.  She does have nasal pillows.  She says she is going to try to start back wearing it next week.  She is encouraged on CPAP compliance.  Patient education on sleep apnea.  She does have a dental appointment next week to discuss some of the soreness that she continues to have.  Patient does complain over the last 2 weeks that she has had some increased pain along the right shoulder and upper back especially when she moves or takes in a deep breath.  She denies any cough wheezing or shortness of breath.  She is had no known injury but says that she has chronic shoulder pain from previous falls and degenerative joint disease.    Allergies  Allergen Reactions   Metformin Diarrhea    Immunization History  Administered Date(s) Administered   Fluad Quad(high Dose 65+) 12/31/2015   Influenza Split 09/20/2011, 07/13/2012   Influenza, High Dose Seasonal PF 08/07/2016, 08/04/2017, 07/21/2018, 07/22/2019   Influenza,inj,Quad PF,6+ Mos 10/04/2013   Influenza-Unspecified 09/20/2011, 07/13/2012, 10/04/2013, 08/07/2016   Pneumococcal Conjugate-13 05/11/2014   Pneumococcal Polysaccharide-23 07/13/2012   Pneumococcal-Unspecified 07/15/2012    Zoster, Live 07/29/2012    Past Medical History:  Diagnosis Date   Arthritis    Back pain    Chronic anticoagulation    due to aflutter   Chronic kidney disease    Diabetes mellitus    Diastolic CHF, chronic ([place])    a.  echo 2006 - ef 55-65%; mild diast dysfxn;    b. Echo 08/2011: Mild LVH, EF 60%;  c. 04/2013 Echo: EF 65-69%, mild conc LVH;  08/2014 Echo: EF 60-65%, mild-mod MR.   Gout    Hyperlipidemia    Hypertension    a.  Renal arterial Dopplers 12/2011: 1-59% right renal artery stenosis   Morbid obesity ([place])    Obstructive sleep apnea on CPAP    Paroxysmal Afib/Flutter    a. 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    Tobacco History: Social History   Tobacco Use  Smoking Status Never  Smokeless Tobacco Never   Counseling given: Not Answered   Outpatient Medications Prior to Visit  Medication Sig Dispense Refill   Accu-Chek Softclix Lancets lancets 1 each by Other route 3 (three) times daily. as directed 100 each 3   allopurinol (ZYLOPRIM) 300 MG tablet TAKE 1 TABLET(300 MG) BY MOUTH DAILY 90 tablet 3   amiodarone (PACERONE) 200 MG tablet Take 1 tablet (200 mg total) by mouth daily. 90 tablet 2   amLODipine (NORVASC) 5 MG tablet Take 1 tablet (5 mg total) by mouth daily. 90 tablet 0   blood glucose meter kit and  supplies Dispense based on patient and insurance preference. Use up to four times daily as directed. (FOR ICD-10 E10.9, E11.9). (Patient taking differently: 1 each by Other route See admin instructions. Dispense based on patient and insurance preference. Use up to four times daily as directed. (FOR ICD-10 E10.9, E11.9).) 1 each 0   cloNIDine (CATAPRES) 0.3 MG tablet Take 1 tablet (0.3 mg total) by mouth 2 (two) times daily. 180 tablet 3   Continuous Blood Gluc Receiver (FREESTYLE LIBRE 14 DAY READER) DEVI Use as directed. 1 each 0   Continuous Blood Gluc Sensor (FREESTYLE LIBRE 14 DAY SENSOR) MISC Use as directed to check blood sugar daily 2 each 3    cyclobenzaprine (FLEXERIL) 5 MG tablet Take 1 tablet (5 mg total) by mouth as needed for muscle spasms. 30 tablet 0   diltiazem (CARDIZEM) 30 MG tablet Take 1 tablet (30 mg total) by mouth See admin instructions. Take one tablet every 4 hours as needed for heart rate greater than 100 and blood pressure needs to be above 100 15 tablet 2   ELIQUIS 5 MG TABS tablet TAKE 1 TABLET(5 MG) BY MOUTH TWICE DAILY 180 tablet 1   Insulin Lispro Prot & Lispro (HUMALOG MIX 75/25 KWIKPEN) (75-25) 100 UNIT/ML Kwikpen Inject 17 Units into the skin 2 (two) times daily. 30 mL 3   lidocaine (LIDODERM) 5 % Place 1 patch onto the skin daily. Remove & Discard patch within 12 hours or as directed by MD (Patient taking differently: Place 1 patch onto the skin daily as needed (Pain). Remove & Discard patch within 12 hours or as directed by MD) 30 patch 0   loperamide (IMODIUM) 2 MG capsule Take 1 capsule (2 mg total) by mouth 4 (four) times daily as needed for diarrhea or loose stools. 12 capsule 0   losartan (COZAAR) 100 MG tablet Take 1 tablet (100 mg total) by mouth daily. 90 tablet 3   Na Sulfate-K Sulfate-Mg Sulf (SUPREP BOWEL PREP KIT) 17.5-3.13-1.6 GM/177ML SOLN Take 1 kit by mouth as directed. 324 mL 0   NOVOFINE PLUS PEN NEEDLE 32G X 4 MM MISC USE TWICE DAILY AS DIRECTED (Patient taking differently: 1 each by Other route as directed.) 100 each 11   Olopatadine HCl 0.2 % SOLN Place 1 drop into the right eye daily.     ONETOUCH VERIO test strip 1 each by Other route 3 (three) times daily.     potassium chloride (KLOR-CON) 10 MEQ tablet Take 2 tablets (20 mEq total) by mouth daily. 180 tablet 3   RESTASIS MULTIDOSE 0.05 % ophthalmic emulsion Place 1 drop into both eyes 2 (two) times daily.      rosuvastatin (CRESTOR) 10 MG tablet TAKE 1 TABLET BY MOUTH IN  THE EVENING (Patient taking differently: Take 10 mg by mouth every evening.) 90 tablet 3   Semaglutide, 1 MG/DOSE, (OZEMPIC, 1 MG/DOSE,) 4 MG/3ML SOPN Inject 1 mg into  the skin once a week. 9 mL 3   torsemide (DEMADEX) 20 MG tablet Take 1 tablet (20 mg total) by mouth daily.     triamcinolone cream (KENALOG) 0.1 % Apply 1 application topically 2 (two) times daily. (Patient taking differently: Apply 1 application topically daily as needed (Rash under breast).) 30 g 0   verapamil (CALAN) 120 MG tablet Take 1 tablet (120 mg total) by mouth in the morning.     levothyroxine (SYNTHROID) 75 MCG tablet Take 1 tablet (75 mcg total) by mouth daily before breakfast. 90 tablet 1  No facility-administered medications prior to visit.     Review of Systems:   Constitutional:   No  weight loss, night sweats,  Fevers, chills, fatigue, or  lassitude.  HEENT:   No headaches,  Difficulty swallowing,  Tooth/dental problems, or  Sore throat,                No sneezing, itching, ear ache, nasal congestion, post nasal drip,   CV:  No chest pain,  Orthopnea, PND, swelling in lower extremities, anasarca, dizziness, palpitations, syncope.   GI  No heartburn, indigestion, abdominal pain, nausea, vomiting, diarrhea, change in bowel habits, loss of appetite, bloody stools.   Resp: No shortness of breath with exertion or at rest.  No excess mucus, no productive cough,  No non-productive cough,  No coughing up of blood.  No change in color of mucus.  No wheezing.  No chest wall deformity  Skin: no rash or lesions.  GU: no dysuria, change in color of urine, no urgency or frequency.  No flank pain, no hematuria   MS: Right-sided shoulder and back pain.  Physical Exam  BP 130/88 (BP Location: Left Arm, Patient Position: Sitting, Cuff Size: Large)   Pulse 71   Temp 97.6 F (36.4 C) (Oral)   Ht '5\' 1"'  (1.549 m)   Wt 246 lb 3.2 oz (111.7 kg)   SpO2 95%   BMI 46.52 kg/m   GEN: A/Ox3; pleasant , NAD, well nourished    HEENT:  [place]/AT,  NOSE-clear, THROAT-clear, no lesions, no postnasal drip or exudate noted.  Class II-III MP airway  NECK:  Supple w/ fair ROM; no JVD; normal  carotid impulses w/o bruits; no thyromegaly or nodules palpated; no lymphadenopathy.    RESP  Clear  P & A; w/o, wheezes/ rales/ or rhonchi. no accessory muscle use, no dullness to percussion  CARD:  RRR, no m/r/g,tr  peripheral edema, pulses intact, no cyanosis or clubbing.  GI:   Soft & nt; nml bowel sounds; no organomegaly or masses detected.   Musco: Warm bil, no deformities or joint swelling noted.   Neuro: alert, no focal deficits noted.    Skin: Warm, no lesions or rashes    Lab Results:  CBC  BMET  Imaging:     PFT Results Latest Ref Rng & Units 03/25/2017  FVC-Pre L 1.72  FVC-Predicted Pre % 68  FVC-Post L 1.67  FVC-Predicted Post % 66  Pre FEV1/FVC % % 61  Post FEV1/FCV % % 62  FEV1-Pre L 1.05  FEV1-Predicted Pre % 55  FEV1-Post L 1.04  DLCO uncorrected ml/min/mmHg 14.06  DLCO UNC% % 69  DLVA Predicted % 106  TLC L 5.17  TLC % Predicted % 112  RV % Predicted % 138    No results found for: NITRICOXIDE      Assessment & Plan:   Restrictive lung disease Questionable pleurisy versus musculoskeletal in nature. Check chest x-ray today.  Advised patient to put warm heat along the shoulder and back  Plan  Patient Instructions  Restart CPAP At bedtime   Wear all night long  Healthy sleep regimen  Do not drive if sleepy .  Work on healthy weight loss.   Warm heat to back and shoulder .   Chest xray today .   Follow up with dentist as planned.   Follow up with Dr. Annamaria Boots  in 6 months and As needed            Obstructive sleep apnea on  CPAP Patient is encouraged on CPAP compliance.  Plan  Patient Instructions  Restart CPAP At bedtime   Wear all night long  Healthy sleep regimen  Do not drive if sleepy .  Work on healthy weight loss.   Warm heat to back and shoulder .   Chest xray today .   Follow up with dentist as planned.   Follow up with Dr. Annamaria Boots  in 6 months and As needed          OBESITY, MORBID Healthy weight  loss     Rexene Edison, NP 07/04/2021

## 2021-07-05 ENCOUNTER — Encounter: Payer: Self-pay | Admitting: Podiatry

## 2021-07-05 ENCOUNTER — Ambulatory Visit (INDEPENDENT_AMBULATORY_CARE_PROVIDER_SITE_OTHER): Payer: Medicare Other | Admitting: Podiatry

## 2021-07-05 DIAGNOSIS — M79674 Pain in right toe(s): Secondary | ICD-10-CM

## 2021-07-05 DIAGNOSIS — B351 Tinea unguium: Secondary | ICD-10-CM | POA: Diagnosis not present

## 2021-07-05 DIAGNOSIS — E1142 Type 2 diabetes mellitus with diabetic polyneuropathy: Secondary | ICD-10-CM | POA: Diagnosis not present

## 2021-07-05 DIAGNOSIS — Z794 Long term (current) use of insulin: Secondary | ICD-10-CM | POA: Diagnosis not present

## 2021-07-05 DIAGNOSIS — M79675 Pain in left toe(s): Secondary | ICD-10-CM | POA: Diagnosis not present

## 2021-07-10 ENCOUNTER — Telehealth: Payer: Self-pay

## 2021-07-10 MED ORDER — AMOXICILLIN-POT CLAVULANATE 875-125 MG PO TABS: 1.0000 | ORAL_TABLET | Freq: Two times a day (BID) | ORAL | 0 refills | Status: DC

## 2021-07-10 NOTE — Progress Notes (Signed)
Called and spoke with patient, advised of results/recommendations per Rexene Edison NP.  She shared that she had just finished Cefuroximine 500 mg, for a UTI began 9/11 and finished on 9/17.  She states she is not feeling better.  Script sent to pharmacy after verifying pharmacy.  She verified understanding.  Nothing further needed.  Spoke with TP regarding recent antibiotic use and advised to send in Augmentin.

## 2021-07-10 NOTE — Addendum Note (Signed)
Addended by: Vanessa Barbara on: 07/10/2021 10:04 AM   Modules accepted: Orders

## 2021-07-11 NOTE — Progress Notes (Signed)
  Subjective:  Patient ID: [name], female    DOB: Feb 28, 1941,  MRN: [id number]  80 y.o. female presents with at risk foot care with history of diabetic neuropathy and painful thick toenails that are difficult to trim. Pain interferes with ambulation. Aggravating factors include wearing enclosed shoe gear. Pain is relieved with periodic professional debridement..    Patient did not check blood glucose this morning. She states she and her husband have recently moved and she has been preoccupied with that.  She notes no new pedal problems on today's visit. Patient states she had a tooth removed in July and had complications of bleeding due to Eliquis use.   PCP: Horald Pollen, MD and last visit was:   Review of Systems: Negative except as noted in the HPI.   Allergies  Allergen Reactions   Metformin Diarrhea    Objective:  There were no vitals filed for this visit. Constitutional Patient is a pleasant 80 y.o. Hispanic female obese in NAD. AAO x 3.  Vascular Capillary fill time to digits immediate b/l.  DP/PT pulse(s) are palpable b/l lower extremities. Pedal hair present b/l.  Lower extremity skin temperature gradient within normal limits. No pain with calf compression b/l. No edema noted b/l lower extremities. No cyanosis or clubbing noted.   Neurologic Protective sensation intact 5/5 intact bilaterally with 10g monofilament b/l. Vibratory sensation intact b/l. No clonus b/l. Pt has subjective symptoms of neuropathy.  Dermatologic Pedal skin is warm and supple b/l.  No open wounds b/l lower extremities. No interdigital macerations b/l lower extremities. Toenails 1-5 b/l elongated, discolored, dystrophic, thickened, crumbly with subungual debris and tenderness to dorsal palpation.   Orthopedic: Normal muscle strength 5/5 to all lower extremity muscle groups bilaterally. Patient ambulates independent of any assistive aids. Hallux valgus with bunion deformity noted b/l lower  extremities. Hammertoe(s) noted to the 2-5 bilaterally.   Hemoglobin A1C Latest Ref Rng & Units 05/24/2021 11/29/2020 09/12/2020  HGBA1C 4.0 - 5.6 % 7.8(A) 7.4(A) 7.7(H)  Some recent data might be hidden   Assessment:   1. Pain due to onychomycosis of toenails of both feet   2. Type 2 diabetes mellitus with diabetic polyneuropathy, with long-term current use of insulin ([place])    Plan:  Patient was evaluated and treated and all questions answered. Consent given for treatment as described below: -Examined patient. -Continue diabetic foot care principles: inspect feet daily, monitor glucose as recommended by PCP and/or Endocrinologist, and follow prescribed diet per PCP, Endocrinologist and/or dietician. -Patient to continue soft, supportive shoe gear daily. -Toenails 1-5 b/l were debrided in length and girth with sterile nail nippers and dremel without iatrogenic bleeding.  -Patient to report any pedal injuries to medical professional immediately. -Patient/POA to call should there be question/concern in the interim.  Return in about 3 months (around 10/04/2021).  Marzetta Board, DPM

## 2021-07-16 ENCOUNTER — Other Ambulatory Visit: Payer: Self-pay | Admitting: Orthopaedic Surgery

## 2021-07-16 ENCOUNTER — Telehealth: Payer: Self-pay

## 2021-07-16 NOTE — Telephone Encounter (Signed)
Received pharmacy request for refill on tramadol 50mg  # 40. Last refill on 08/29/20 If ok please send to [facility] on [address], [place]   Please advise

## 2021-07-16 NOTE — Telephone Encounter (Signed)
Called and advised pharmacy

## 2021-07-17 ENCOUNTER — Other Ambulatory Visit: Payer: Self-pay

## 2021-07-17 ENCOUNTER — Encounter: Payer: Self-pay | Admitting: Internal Medicine

## 2021-07-17 ENCOUNTER — Ambulatory Visit (INDEPENDENT_AMBULATORY_CARE_PROVIDER_SITE_OTHER): Payer: Medicare Other | Admitting: Internal Medicine

## 2021-07-17 ENCOUNTER — Ambulatory Visit (INDEPENDENT_AMBULATORY_CARE_PROVIDER_SITE_OTHER): Payer: Medicare Other

## 2021-07-17 VITALS — BP 124/74 | HR 66 | Temp 97.8°F | Resp 18 | Ht 61.0 in | Wt 249.0 lb

## 2021-07-17 DIAGNOSIS — E1159 Type 2 diabetes mellitus with other circulatory complications: Secondary | ICD-10-CM | POA: Diagnosis not present

## 2021-07-17 DIAGNOSIS — R6883 Chills (without fever): Secondary | ICD-10-CM | POA: Diagnosis not present

## 2021-07-17 DIAGNOSIS — J189 Pneumonia, unspecified organism: Secondary | ICD-10-CM | POA: Diagnosis not present

## 2021-07-17 DIAGNOSIS — N390 Urinary tract infection, site not specified: Secondary | ICD-10-CM | POA: Diagnosis not present

## 2021-07-17 DIAGNOSIS — I152 Hypertension secondary to endocrine disorders: Secondary | ICD-10-CM | POA: Diagnosis not present

## 2021-07-17 DIAGNOSIS — R441 Visual hallucinations: Secondary | ICD-10-CM | POA: Diagnosis not present

## 2021-07-17 DIAGNOSIS — I700 Atherosclerosis of aorta: Secondary | ICD-10-CM | POA: Diagnosis not present

## 2021-07-17 LAB — CBC WITH DIFFERENTIAL/PLATELET
Basophils Absolute: 0 10*3/uL (ref 0.0–0.1)
Basophils Relative: 0.4 % (ref 0.0–3.0)
Eosinophils Absolute: 0.2 10*3/uL (ref 0.0–0.7)
Eosinophils Relative: 1.8 % (ref 0.0–5.0)
HCT: 37.1 % (ref 36.0–46.0)
Hemoglobin: 11.7 g/dL — ABNORMAL LOW (ref 12.0–15.0)
Lymphocytes Relative: 23.7 % (ref 12.0–46.0)
Lymphs Abs: 2.7 10*3/uL (ref 0.7–4.0)
MCHC: 31.6 g/dL (ref 30.0–36.0)
MCV: 81.4 fl (ref 78.0–100.0)
Monocytes Absolute: 1.1 10*3/uL — ABNORMAL HIGH (ref 0.1–1.0)
Monocytes Relative: 9.9 % (ref 3.0–12.0)
Neutro Abs: 7.2 10*3/uL (ref 1.4–7.7)
Neutrophils Relative %: 64.2 % (ref 43.0–77.0)
Platelets: 352 10*3/uL (ref 150.0–400.0)
RBC: 4.55 Mil/uL (ref 3.87–5.11)
RDW: 17.2 % — ABNORMAL HIGH (ref 11.5–15.5)
WBC: 11.2 10*3/uL — ABNORMAL HIGH (ref 4.0–10.5)

## 2021-07-17 LAB — URINALYSIS, ROUTINE W REFLEX MICROSCOPIC
Bilirubin Urine: NEGATIVE
Hgb urine dipstick: NEGATIVE
Ketones, ur: NEGATIVE
Leukocytes,Ua: NEGATIVE
Nitrite: NEGATIVE
Specific Gravity, Urine: 1.01 (ref 1.000–1.030)
Total Protein, Urine: NEGATIVE
Urine Glucose: NEGATIVE
Urobilinogen, UA: 1 (ref 0.0–1.0)
pH: 8 (ref 5.0–8.0)

## 2021-07-17 LAB — BASIC METABOLIC PANEL
BUN: 9 mg/dL (ref 6–23)
CO2: 31 mEq/L (ref 19–32)
Calcium: 9.1 mg/dL (ref 8.4–10.5)
Chloride: 99 mEq/L (ref 96–112)
Creatinine, Ser: 0.61 mg/dL (ref 0.40–1.20)
GFR: 86.02 mL/min (ref >60.00)
Glucose, Bld: 117 mg/dL — ABNORMAL HIGH (ref 70–99)
Potassium: 3.5 mEq/L (ref 3.5–5.1)
Sodium: 139 mEq/L (ref 135–145)

## 2021-07-17 LAB — HEPATIC FUNCTION PANEL
ALT: 15 U/L (ref 0–35)
AST: 18 U/L (ref 0–37)
Albumin: 3.9 g/dL (ref 3.5–5.2)
Alkaline Phosphatase: 100 U/L (ref 39–117)
Bilirubin, Direct: 0.1 mg/dL (ref 0.0–0.3)
Total Bilirubin: 0.5 mg/dL (ref 0.2–1.2)
Total Protein: 7.8 g/dL (ref 6.0–8.3)

## 2021-07-17 MED ORDER — CEFTRIAXONE SODIUM 1 G IJ SOLR
1.0000 g | Freq: Once | INTRAMUSCULAR | Status: AC
  Administered 2021-07-17: 1 g via INTRAMUSCULAR

## 2021-07-17 MED ORDER — LEVOFLOXACIN 500 MG PO TABS: 500.0000 mg | ORAL_TABLET | Freq: Every day | ORAL | 0 refills | Status: AC

## 2021-07-17 NOTE — Patient Instructions (Addendum)
You had the antibiotic shot today (rocephin)  Ok to stop your current antibiotic (augmentin)  Please take all new medication as prescribed  - the levaquin at 1 pill per day for 10 days  Please continue all other medications as before, and refills have been done if requested.  Please have the pharmacy call with any other refills you may need.  I forwarded your concern about the hallucinations to your PCP as you asked  Please keep your appointments with your specialists as you may have planned  Please go to the XRAY Department in the first floor for the x-ray testing  Please go to the LAB at the blood drawing area for the tests to be done  You will be contacted by phone if any changes need to be made immediately.  Otherwise, you will receive a letter about your results with an explanation, but please check with MyChart first.  Please remember to sign up for MyChart if you have not done so, as this will be important to you in the future with finding out test results, communicating by private email, and scheduling acute appointments online when needed. '

## 2021-07-17 NOTE — Progress Notes (Signed)
Patient ID: [name], female   DOB: 1941-03-22, 80 y.o.   MRN: [id number]        Chief Complaint: follow up 3 days worsening chills       HPI:  [name] is a 80 y.o. female here with c/o 3 days worsening chills, feels ill and mild sob but just cant understand what is going on or why she is feeling ill.  Feels like freezing at times and shaking, wearing heavy winter clothes todya.  Wonders if the eliquis is doing this, but did have recent cxr 9/15 with bronchitis/pna RLL, tx with augmentin per pulmonary ; has hx of CHF, pna hospd in [place] last yr with a wk stay in ICU with pna.  Did also have recent UTI tx with ceftin sept 6 with ceftin.  No diarrhea and Denies worsening reflux, abd pain, dysphagia, n/v, bowel change or blood. No dysuria or GU symtpoms as well. Also mentions asking me to let PCP know she is having visual hallucinations and has left 3 messages and not sure why she did not get a response, asking for advice for next step       Wt Readings from Last 3 Encounters:  07/17/21 249 lb (112.9 kg)  07/04/21 246 lb 3.2 oz (111.7 kg)  06/13/21 248 lb 0.3 oz (112.5 kg)   BP Readings from Last 3 Encounters:  07/17/21 124/74  07/04/21 130/88  06/13/21 (!) 165/90         Past Medical History:  Diagnosis Date   Arthritis    Back pain    Chronic anticoagulation    due to aflutter   Chronic kidney disease    Diabetes mellitus    Diastolic CHF, chronic ([place])    a.  echo 2006 - ef 55-65%; mild diast dysfxn;    b. Echo 08/2011: Mild LVH, EF 60%;  c. 04/2013 Echo: EF 65-69%, mild conc LVH;  08/2014 Echo: EF 60-65%, mild-mod MR.   Gout    Hyperlipidemia    Hypertension    a.  Renal arterial Dopplers 12/2011: 1-59% right renal artery stenosis   Morbid obesity ([place])    Obstructive sleep apnea on CPAP    Paroxysmal Afib/Flutter    a. 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   Past Surgical History:  Procedure Laterality Date   APPENDECTOMY     ATRIAL FLUTTER ABLATION N/A  09/24/2011   Procedure: ATRIAL FLUTTER ABLATION;  Surgeon: [name], MD;  Location: [facility] CATH LAB;  Service: Cardiovascular;  Laterality: N/A;   CARDIOVERSION  10/22/2011   Procedure: CARDIOVERSION;  Surgeon: [name], MD;  Location: [place];  Service: Cardiovascular;  Laterality: N/A;   CARDIOVERSION N/A 09/10/2011   Procedure: CARDIOVERSION;  Surgeon: [name], MD;  Location: [facility] CATH LAB;  Service: Cardiovascular;  Laterality: N/A;   CHOLECYSTECTOMY     COLONOSCOPY WITH PROPOFOL N/A 06/13/2021   Procedure: COLONOSCOPY WITH PROPOFOL;  Surgeon: Milus Banister, MD;  Location: [facility] ENDOSCOPY;  Service: Endoscopy;  Laterality: N/A;   POLYPECTOMY  06/13/2021   Procedure: POLYPECTOMY;  Surgeon: Milus Banister, MD;  Location: [facility] ENDOSCOPY;  Service: Endoscopy;;   [place]      reports that she has never smoked. She has never used smokeless tobacco. She reports that she does not drink alcohol and does not use drugs. family history includes Breast cancer in her sister; Cancer in her sister; Heart disease in her father; Hypertension in her father.  Allergies  Allergen Reactions   Metformin Diarrhea   Current Outpatient Medications on File Prior to Visit  Medication Sig Dispense Refill   Accu-Chek Softclix Lancets lancets 1 each by Other route 3 (three) times daily. as directed 100 each 3   allopurinol (ZYLOPRIM) 300 MG tablet TAKE 1 TABLET(300 MG) BY MOUTH DAILY 90 tablet 3   amiodarone (PACERONE) 200 MG tablet Take 1 tablet (200 mg total) by mouth daily. 90 tablet 2   amLODipine (NORVASC) 5 MG tablet Take 1 tablet (5 mg total) by mouth daily. 90 tablet 0   amoxicillin-clavulanate (AUGMENTIN) 875-125 MG tablet Take 1 tablet by mouth 2 (two) times daily. 14 tablet 0   blood glucose meter kit and supplies Dispense based on patient and insurance preference. Use up to four times daily as directed. (FOR ICD-10 E10.9, E11.9). (Patient taking  differently: 1 each by Other route See admin instructions. Dispense based on patient and insurance preference. Use up to four times daily as directed. (FOR ICD-10 E10.9, E11.9).) 1 each 0   cloNIDine (CATAPRES) 0.3 MG tablet Take 1 tablet (0.3 mg total) by mouth 2 (two) times daily. 180 tablet 3   Continuous Blood Gluc Receiver (FREESTYLE LIBRE 14 DAY READER) DEVI Use as directed. 1 each 0   Continuous Blood Gluc Sensor (FREESTYLE LIBRE 14 DAY SENSOR) MISC Use as directed to check blood sugar daily 2 each 3   cyclobenzaprine (FLEXERIL) 5 MG tablet Take 1 tablet (5 mg total) by mouth as needed for muscle spasms. 30 tablet 0   diltiazem (CARDIZEM) 30 MG tablet Take 1 tablet (30 mg total) by mouth See admin instructions. Take one tablet every 4 hours as needed for heart rate greater than 100 and blood pressure needs to be above 100 15 tablet 2   ELIQUIS 5 MG TABS tablet TAKE 1 TABLET(5 MG) BY MOUTH TWICE DAILY 180 tablet 1   Insulin Lispro Prot & Lispro (HUMALOG MIX 75/25 KWIKPEN) (75-25) 100 UNIT/ML Kwikpen Inject 17 Units into the skin 2 (two) times daily. 30 mL 3   lidocaine (LIDODERM) 5 % Place 1 patch onto the skin daily. Remove & Discard patch within 12 hours or as directed by MD (Patient taking differently: Place 1 patch onto the skin daily as needed (Pain). Remove & Discard patch within 12 hours or as directed by MD) 30 patch 0   loperamide (IMODIUM) 2 MG capsule Take 1 capsule (2 mg total) by mouth 4 (four) times daily as needed for diarrhea or loose stools. 12 capsule 0   losartan (COZAAR) 100 MG tablet Take 1 tablet (100 mg total) by mouth daily. 90 tablet 3   Na Sulfate-K Sulfate-Mg Sulf (SUPREP BOWEL PREP KIT) 17.5-3.13-1.6 GM/177ML SOLN Take 1 kit by mouth as directed. 324 mL 0   NOVOFINE PLUS PEN NEEDLE 32G X 4 MM MISC USE TWICE DAILY AS DIRECTED (Patient taking differently: 1 each by Other route as directed.) 100 each 11   Olopatadine HCl 0.2 % SOLN Place 1 drop into the right eye daily.      ONETOUCH VERIO test strip 1 each by Other route 3 (three) times daily.     potassium chloride (KLOR-CON) 10 MEQ tablet Take 2 tablets (20 mEq total) by mouth daily. 180 tablet 3   RESTASIS MULTIDOSE 0.05 % ophthalmic emulsion Place 1 drop into both eyes 2 (two) times daily.      rosuvastatin (CRESTOR) 10 MG tablet TAKE 1 TABLET BY MOUTH IN  THE EVENING (Patient taking  differently: Take 10 mg by mouth every evening.) 90 tablet 3   Semaglutide, 1 MG/DOSE, (OZEMPIC, 1 MG/DOSE,) 4 MG/3ML SOPN Inject 1 mg into the skin once a week. 9 mL 3   torsemide (DEMADEX) 20 MG tablet Take 1 tablet (20 mg total) by mouth daily.     triamcinolone cream (KENALOG) 0.1 % Apply 1 application topically 2 (two) times daily. (Patient taking differently: Apply 1 application topically daily as needed (Rash under breast).) 30 g 0   verapamil (CALAN) 120 MG tablet Take 1 tablet (120 mg total) by mouth in the morning.     levothyroxine (SYNTHROID) 75 MCG tablet Take 1 tablet (75 mcg total) by mouth daily before breakfast. 90 tablet 1   No current facility-administered medications on file prior to visit.        ROS:  All others reviewed and negative.  Objective        PE:  BP 124/74   Pulse 66   Temp 97.8 F (36.6 C) (Oral)   Resp 18   Ht '5\' 1"'  (1.549 m)   Wt 249 lb (112.9 kg)   SpO2 98%   BMI 47.05 kg/m                 Constitutional: Pt appears in NAD but mild ill appearing, obese               HENT: Head: NCAT.                Right Ear: External ear normal.                 Left Ear: External ear normal.                Eyes: . Pupils are equal, round, and reactive to light. Conjunctivae and EOM are normal               Nose: without d/c or deformity               Neck: Neck supple. Gross normal ROM               Cardiovascular: Normal rate and regular rhythm.                 Pulmonary/Chest: Effort normal and breath sounds decreased without rales or wheezing.                Abd:  Soft, NT, ND, + BS, no  organomegaly               Neurological: Pt is alert. At baseline orientation, motor grossly intact               Skin: Skin is warm. No rashes, no other new lesions, LE edema - none               Psychiatric: Pt behavior is normal without agitation   Micro: none  Cardiac tracings I have personally interpreted today:  none  Pertinent Radiological findings (summarize): none   Lab Results  Component Value Date   WBC 11.2 (H) 07/17/2021   HGB 11.7 (L) 07/17/2021   HCT 37.1 07/17/2021   PLT 352.0 07/17/2021   GLUCOSE 117 (H) 07/17/2021   CHOL 135 09/12/2020   TRIG 84 09/12/2020   HDL 62 09/12/2020   LDLCALC 57 09/12/2020   ALT 15 07/17/2021   AST 18 07/17/2021   NA 139 07/17/2021   K 3.5 07/17/2021   CL 99 07/17/2021  CREATININE 0.61 07/17/2021   BUN 9 07/17/2021   CO2 31 07/17/2021   TSH 3.350 03/12/2021   INR 2.0 06/15/2020   HGBA1C 7.8 (A) 05/24/2021   MICROALBUR 6.3 (H) 06/21/2020   Assessment/Plan:  [name] is a 80 y.o. Other or two or more races [6] female with  has a past medical history of Arthritis, Back pain, Chronic anticoagulation, Chronic kidney disease, Diabetes mellitus, Diastolic CHF, chronic ([place]), Gout, Hyperlipidemia, Hypertension, Morbid obesity ([place]), Obstructive sleep apnea on CPAP, and Paroxysmal Afib/Flutter.  Chills Exam is c/w probable underlying worsening CAP I suspect,, pt declines ED; for labs including cbc and blood culture, and empirix levaquin  CAP (community acquired pneumonia) Suspect worsening as above, for rocephin 1 gm IM, then levaquin asd and cxr  Visual hallucination Also will forward message to pcp about pt asking for advice on next step in eval and tx   Hypertension associated with diabetes ([place]) BP Readings from Last 3 Encounters:  07/17/21 124/74  07/04/21 130/88  06/13/21 (!) 165/90   Stable, pt to continue medical treatment losartan, norvasc  Followup: Return if symptoms worsen or fail to improve.  Cathlean Cower,  MD 07/18/2021 10:11 PM [facility]

## 2021-07-18 ENCOUNTER — Encounter: Payer: Self-pay | Admitting: Internal Medicine

## 2021-07-18 NOTE — Assessment & Plan Note (Signed)
Also will forward message to pcp about pt asking for advice on next step in eval and tx

## 2021-07-18 NOTE — Assessment & Plan Note (Signed)
Exam is c/w probable underlying worsening CAP I suspect,, pt declines ED; for labs including cbc and blood culture, and empirix levaquin

## 2021-07-18 NOTE — Assessment & Plan Note (Signed)
BP Readings from Last 3 Encounters:  07/17/21 124/74  07/04/21 130/88  06/13/21 (!) 165/90   Stable, pt to continue medical treatment losartan, norvasc

## 2021-07-18 NOTE — Assessment & Plan Note (Addendum)
Suspect worsening as above, for rocephin 1 gm IM, then levaquin asd and cxr

## 2021-07-20 ENCOUNTER — Other Ambulatory Visit: Payer: Self-pay | Admitting: Emergency Medicine

## 2021-07-20 DIAGNOSIS — F341 Dysthymic disorder: Secondary | ICD-10-CM

## 2021-07-23 ENCOUNTER — Other Ambulatory Visit: Payer: Self-pay

## 2021-07-23 LAB — CULTURE, BLOOD (SINGLE)

## 2021-07-23 LAB — URINE CULTURE: Result:: NO GROWTH

## 2021-07-24 ENCOUNTER — Ambulatory Visit (INDEPENDENT_AMBULATORY_CARE_PROVIDER_SITE_OTHER): Payer: Medicare Other | Admitting: Emergency Medicine

## 2021-07-24 ENCOUNTER — Encounter: Payer: Self-pay | Admitting: Emergency Medicine

## 2021-07-24 ENCOUNTER — Telehealth: Payer: Self-pay | Admitting: Emergency Medicine

## 2021-07-24 ENCOUNTER — Ambulatory Visit (INDEPENDENT_AMBULATORY_CARE_PROVIDER_SITE_OTHER): Payer: Medicare Other

## 2021-07-24 VITALS — BP 130/70 | HR 78 | Temp 98.0°F | Ht 61.0 in | Wt 247.0 lb

## 2021-07-24 DIAGNOSIS — I700 Atherosclerosis of aorta: Secondary | ICD-10-CM

## 2021-07-24 DIAGNOSIS — I152 Hypertension secondary to endocrine disorders: Secondary | ICD-10-CM | POA: Diagnosis not present

## 2021-07-24 DIAGNOSIS — J811 Chronic pulmonary edema: Secondary | ICD-10-CM | POA: Diagnosis not present

## 2021-07-24 DIAGNOSIS — Z794 Long term (current) use of insulin: Secondary | ICD-10-CM

## 2021-07-24 DIAGNOSIS — I480 Paroxysmal atrial fibrillation: Secondary | ICD-10-CM

## 2021-07-24 DIAGNOSIS — J189 Pneumonia, unspecified organism: Secondary | ICD-10-CM

## 2021-07-24 DIAGNOSIS — E1159 Type 2 diabetes mellitus with other circulatory complications: Secondary | ICD-10-CM

## 2021-07-24 DIAGNOSIS — F341 Dysthymic disorder: Secondary | ICD-10-CM | POA: Diagnosis not present

## 2021-07-24 DIAGNOSIS — E1165 Type 2 diabetes mellitus with hyperglycemia: Secondary | ICD-10-CM | POA: Diagnosis not present

## 2021-07-24 DIAGNOSIS — J9811 Atelectasis: Secondary | ICD-10-CM | POA: Diagnosis not present

## 2021-07-24 DIAGNOSIS — Z7901 Long term (current) use of anticoagulants: Secondary | ICD-10-CM | POA: Diagnosis not present

## 2021-07-24 DIAGNOSIS — E039 Hypothyroidism, unspecified: Secondary | ICD-10-CM | POA: Diagnosis not present

## 2021-07-24 NOTE — Assessment & Plan Note (Signed)
Sees endocrinologist on a regular basis. Lab Results  Component Value Date   HGBA1C 7.8 (A) 05/24/2021

## 2021-07-24 NOTE — Patient Instructions (Signed)
Health Maintenance After Age 78 After age 78, you are at a higher risk for certain long-term diseases and infections as well as injuries from falls. Falls are a major cause of broken bones and head injuries in people who are older than age 78. Getting regular preventive care can help to keep you healthy and well. Preventive care includes getting regular testing and making lifestyle changes as recommended by your health care provider. Talk with your health care provider about: Which screenings and tests you should have. A screening is a test that checks for a disease when you have no symptoms. A diet and exercise plan that is right for you. What should I know about screenings and tests to prevent falls? Screening and testing are the best ways to find a health problem early. Early diagnosis and treatment give you the best chance of managing medical conditions that are common after age 78. Certain conditions and lifestyle choices may make you more likely to have a fall. Your health care provider may recommend: Regular vision checks. Poor vision and conditions such as cataracts can make you more likely to have a fall. If you wear glasses, make sure to get your prescription updated if your vision changes. Medicine review. Work with your health care provider to regularly review all of the medicines you are taking, including over-the-counter medicines. Ask your health care provider about any side effects that may make you more likely to have a fall. Tell your health care provider if any medicines that you take make you feel dizzy or sleepy. Osteoporosis screening. Osteoporosis is a condition that causes the bones to get weaker. This can make the bones weak and cause them to break more easily. Blood pressure screening. Blood pressure changes and medicines to control blood pressure can make you feel dizzy. Strength and balance checks. Your health care provider may recommend certain tests to check your strength and  balance while standing, walking, or changing positions. Foot health exam. Foot pain and numbness, as well as not wearing proper footwear, can make you more likely to have a fall. Depression screening. You may be more likely to have a fall if you have a fear of falling, feel emotionally low, or feel unable to do activities that you used to do. Alcohol use screening. Using too much alcohol can affect your balance and may make you more likely to have a fall. What actions can I take to lower my risk of falls? General instructions Talk with your health care provider about your risks for falling. Tell your health care provider if: You fall. Be sure to tell your health care provider about all falls, even ones that seem minor. You feel dizzy, sleepy, or off-balance. Take over-the-counter and prescription medicines only as told by your health care provider. These include any supplements. Eat a healthy diet and maintain a healthy weight. A healthy diet includes low-fat dairy products, low-fat (lean) meats, and fiber from whole grains, beans, and lots of fruits and vegetables. Home safety Remove any tripping hazards, such as rugs, cords, and clutter. Install safety equipment such as grab bars in bathrooms and safety rails on stairs. Keep rooms and walkways well-lit. Activity  Follow a regular exercise program to stay fit. This will help you maintain your balance. Ask your health care provider what types of exercise are appropriate for you. If you need a cane or walker, use it as recommended by your health care provider. Wear supportive shoes that have nonskid soles. Lifestyle Do not   drink alcohol if your health care provider tells you not to drink. If you drink alcohol, limit how much you have: 0-1 drink a day for women. 0-2 drinks a day for men. Be aware of how much alcohol is in your drink. In the [place] one drink equals one typical bottle of beer (12 oz), one-half glass of wine (5 oz), or one shot of  hard liquor (1 oz). Do not use any products that contain nicotine or tobacco, such as cigarettes and e-cigarettes. If you need help quitting, ask your health care provider. Summary Having a healthy lifestyle and getting preventive care can help to protect your health and wellness after age 78. Screening and testing are the best way to find a health problem early and help you avoid having a fall. Early diagnosis and treatment give you the best chance for managing medical conditions that are more common for people who are older than age 78. Falls are a major cause of broken bones and head injuries in people who are older than age 78. Take precautions to prevent a fall at home. Work with your health care provider to learn what changes you can make to improve your health and wellness and to prevent falls. This information is not intended to replace advice given to you by your health care provider. Make sure you discuss any questions you have with your health care provider. Document Revised: 12/14/2020 Document Reviewed: 09/21/2020 Elsevier Patient Education  2022 [employer]  

## 2021-07-24 NOTE — Assessment & Plan Note (Signed)
Well-controlled hypertension.  Continue losartan, amlodipine, and verapamil. BP Readings from Last 3 Encounters:  07/24/21 130/70  07/17/21 124/74  07/04/21 130/88

## 2021-07-24 NOTE — Assessment & Plan Note (Signed)
Clinically euthyroid.  Continue Synthroid 75 mcg daily. Lab Results  Component Value Date   TSH 3.350 03/12/2021

## 2021-07-24 NOTE — Assessment & Plan Note (Signed)
Improving.  Continue and finish Levaquin.

## 2021-07-24 NOTE — Progress Notes (Signed)
[name] 80 y.o.   Chief Complaint  Patient presents with   Follow-up    1 week follow up     HISTORY OF PRESENT ILLNESS: This is a 80 y.o. female here for follow-up of pneumonia. Seen by Dr. Jenny Reichmann 07/17/2021 started on Levaquin. Feeling better.  Chest x-ray did show mild multilobular bilateral bronchopneumonia very similar to chest x-ray done on 07/04/2021. Also still waiting to hear from behavioral health for evaluation of chronic depression and anxiety.  Patient still having "hallucinations" when she closes her eyes at night.  Has history of dysthymia.  Several referrals to behavioral health placed in the past but no response yet.  HPI   Prior to Admission medications   Medication Sig Start Date End Date Taking? Authorizing Provider  Accu-Chek Softclix Lancets lancets 1 each by Other route 3 (three) times daily. as directed 03/30/20  Yes Maximiano Coss, NP  allopurinol (ZYLOPRIM) 300 MG tablet TAKE 1 TABLET(300 MG) BY MOUTH DAILY 07/02/21  Yes Tkai Large, Ines Bloomer, MD  amiodarone (PACERONE) 200 MG tablet Take 1 tablet (200 mg total) by mouth daily. 05/27/21  Yes [name], MD  amLODipine (NORVASC) 5 MG tablet Take 1 tablet (5 mg total) by mouth daily. 06/18/21  Yes Horald Pollen, MD  amoxicillin-clavulanate (AUGMENTIN) 875-125 MG tablet Take 1 tablet by mouth 2 (two) times daily. 07/10/21  Yes Parrett, Fonnie Mu, NP  blood glucose meter kit and supplies Dispense based on patient and insurance preference. Use up to four times daily as directed. (FOR ICD-10 E10.9, E11.9). Patient taking differently: 1 each by Other route See admin instructions. Dispense based on patient and insurance preference. Use up to four times daily as directed. (FOR ICD-10 E10.9, E11.9). 05/03/20  Yes Julyan Gales, Ines Bloomer, MD  cloNIDine (CATAPRES) 0.3 MG tablet Take 1 tablet (0.3 mg total) by mouth 2 (two) times daily. 07/02/21  Yes Maximilien Hayashi, Ines Bloomer, MD  Continuous Blood Gluc Receiver (FREESTYLE  LIBRE 14 DAY READER) DEVI Use as directed. 04/18/20  Yes Jakelyn Squyres, Ines Bloomer, MD  Continuous Blood Gluc Sensor (FREESTYLE LIBRE 14 DAY SENSOR) MISC Use as directed to check blood sugar daily 09/24/20  Yes [name], Melanie Crazier, MD  cyclobenzaprine (FLEXERIL) 5 MG tablet Take 1 tablet (5 mg total) by mouth as needed for muscle spasms. 06/18/21  Yes Naythan Douthit, Ines Bloomer, MD  diltiazem (CARDIZEM) 30 MG tablet Take 1 tablet (30 mg total) by mouth See admin instructions. Take one tablet every 4 hours as needed for heart rate greater than 100 and blood pressure needs to be above 100 06/13/21  Yes Sherran Needs, NP  ELIQUIS 5 MG TABS tablet TAKE 1 TABLET(5 MG) BY MOUTH TWICE DAILY 06/17/21  Yes [name], MD  Insulin Lispro Prot & Lispro (HUMALOG MIX 75/25 KWIKPEN) (75-25) 100 UNIT/ML Kwikpen Inject 17 Units into the skin 2 (two) times daily. 05/24/21  Yes [name], Melanie Crazier, MD  levofloxacin (LEVAQUIN) 500 MG tablet Take 1 tablet (500 mg total) by mouth daily for 10 days. 07/17/21 07/27/21 Yes Biagio Borg, MD  lidocaine (LIDODERM) 5 % Place 1 patch onto the skin daily. Remove & Discard patch within 12 hours or as directed by MD Patient taking differently: Place 1 patch onto the skin daily as needed (Pain). Remove & Discard patch within 12 hours or as directed by MD 08/29/20  Yes Mcarthur Rossetti, MD  loperamide (IMODIUM) 2 MG capsule Take 1 capsule (2 mg total) by mouth 4 (four) times daily  as needed for diarrhea or loose stools. 03/20/20  Yes Garald Balding, PA-C  losartan (COZAAR) 100 MG tablet Take 1 tablet (100 mg total) by mouth daily. 05/31/20  Yes Marsha Hillman, Ines Bloomer, MD  Na Sulfate-K Sulfate-Mg Sulf (SUPREP BOWEL PREP KIT) 17.5-3.13-1.6 GM/177ML SOLN Take 1 kit by mouth as directed. 06/12/21  Yes Milus Banister, MD  NOVOFINE PLUS PEN NEEDLE 32G X 4 MM MISC USE TWICE DAILY AS DIRECTED Patient taking differently: 1 each by Other route as directed. 04/08/21  Yes Isair Inabinet,  Ines Bloomer, MD  Olopatadine HCl 0.2 % SOLN Place 1 drop into the right eye daily. 02/21/21  Yes [provider]  ONETOUCH VERIO test strip 1 each by Other route 3 (three) times daily. 06/08/20  Yes [provider]  potassium chloride (KLOR-CON) 10 MEQ tablet Take 2 tablets (20 mEq total) by mouth daily. 03/20/21  Yes Seiler, Amber K, NP  RESTASIS MULTIDOSE 0.05 % ophthalmic emulsion Place 1 drop into both eyes 2 (two) times daily.  03/04/20  Yes [provider]  rosuvastatin (CRESTOR) 10 MG tablet TAKE 1 TABLET BY MOUTH IN  THE EVENING Patient taking differently: Take 10 mg by mouth every evening. 04/25/21  Yes Abbagale Goguen, Ines Bloomer, MD  Semaglutide, 1 MG/DOSE, (OZEMPIC, 1 MG/DOSE,) 4 MG/3ML SOPN Inject 1 mg into the skin once a week. 05/24/21  Yes [name], Melanie Crazier, MD  torsemide (DEMADEX) 20 MG tablet Take 1 tablet (20 mg total) by mouth daily. 05/30/21  Yes Sherran Needs, NP  triamcinolone cream (KENALOG) 0.1 % Apply 1 application topically 2 (two) times daily. Patient taking differently: Apply 1 application topically daily as needed (Rash under breast). 07/26/20  Yes Kacper Cartlidge, Ines Bloomer, MD  verapamil (CALAN) 120 MG tablet Take 1 tablet (120 mg total) by mouth in the morning. 05/30/21  Yes Sherran Needs, NP  levothyroxine (SYNTHROID) 75 MCG tablet Take 1 tablet (75 mcg total) by mouth daily before breakfast. 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   Persistent atrial fibrillation ([place])  08/12/2020   Type 2 diabetes mellitus with hyperglycemia, with long-term current use of insulin ([place]) 06/21/2020   Type 2 diabetes mellitus with diabetic polyneuropathy, with long-term current use of insulin ([place]) 06/21/2020   Mixed hyperlipidemia 06/21/2020   Uncontrolled hypertension 06/11/2020   Aortic atherosclerosis ([place]) 03/28/2020   Diabetic peripheral neuropathy ([place]) 12/26/2019   Intertrigo 10/23/2019   Gastroesophageal reflux disease without esophagitis 03/17/2019   Rash 03/17/2019   Bilateral leg edema 02/16/2019   Pruritus 02/16/2019   Varicose veins of both legs with edema 02/16/2019   Heart murmur 09/05/2018   Restrictive lung disease 02/22/2018   Dysgeusia 06/11/2017   Allergic dermatitis 06/03/2017   Osteoarthritis of left knee 06/03/2017   NAFLD (nonalcoholic fatty liver disease) 08/07/2016   Osteoporosis 12/31/2015   Obesity 12/19/2015   Hypersomnia 12/19/2015   Edema 10/18/2015   Acquired hypothyroidism 74/16/3845   Acute diastolic congestive heart failure, NYHA class 3 ([place]) 10/03/2013   Prolonged QT interval 10/03/2013   Acute  diastolic heart failure ([place]) 10/03/2013   Long term (current) use of anticoagulants 06/09/2012   BENIGN NEOPLASM OF ADRENAL GLAND 11/25/2010   Chronic diastolic heart failure ([place]) 02/20/2010   DM 05/16/2009   GOUT 05/16/2009   OBESITY, MORBID 05/16/2009   Hypertension associated with diabetes ([place]) 05/16/2009   Paroxysmal atrial fibrillation ([place]) 05/16/2009   HYPERLIPIDEMIA 11/30/2008   Obstructive sleep apnea on CPAP 11/30/2008    Past Medical History:  Diagnosis Date   Arthritis    Back pain    Chronic anticoagulation    due to aflutter   Chronic kidney disease    Diabetes mellitus    Diastolic CHF, chronic ([place])    a.  echo 2006 - ef 55-65%; mild diast dysfxn;    b. Echo 08/2011: Mild LVH, EF 60%;  c. 04/2013 Echo: EF 65-69%, mild conc LVH;  08/2014 Echo: EF 60-65%, mild-mod MR.   Gout    Hyperlipidemia     Hypertension    a.  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    Past Surgical History:  Procedure Laterality Date   APPENDECTOMY     ATRIAL FLUTTER ABLATION N/A 09/24/2011   Procedure: ATRIAL FLUTTER ABLATION;  Surgeon: [name], MD;  Location: [facility] CATH LAB;  Service: Cardiovascular;  Laterality: N/A;   CARDIOVERSION  10/22/2011   Procedure: CARDIOVERSION;  Surgeon: [name], MD;  Location: [facility];  Service: Cardiovascular;  Laterality: N/A;   CARDIOVERSION N/A 09/10/2011   Procedure: CARDIOVERSION;  Surgeon: [name], MD;  Location: [facility] CATH LAB;  Service: Cardiovascular;  Laterality: N/A;   CHOLECYSTECTOMY     COLONOSCOPY WITH PROPOFOL N/A 06/13/2021   Procedure: COLONOSCOPY WITH PROPOFOL;  Surgeon: Milus Banister, MD;  Location: [facility] ENDOSCOPY;  Service: Endoscopy;  Laterality: N/A;   POLYPECTOMY  06/13/2021   Procedure: POLYPECTOMY;  Surgeon: Milus Banister, MD;  Location: [facility] ENDOSCOPY;  Service: Endoscopy;;   TONSILLECTOMY  1982   TOTAL ABDOMINAL HYSTERECTOMY      Social History   Socioeconomic History   Marital status: Married    Spouse name: Not on file   Number of children: 3   Years of education: Not on file   Highest education level: Not on file  Occupational History   Occupation: DISABILITY/housewife    Employer: RETIRED  Tobacco Use   Smoking status: Never   Smokeless tobacco: Never  Vaping Use   Vaping Use: Never used  Substance and Sexual Activity   Alcohol use: No   Drug use: No   Sexual activity: Yes  Other Topics Concern   Not on file  Social History Narrative   Not on file   Social Determinants of Health   Financial Resource Strain: Not on file  Food Insecurity: No Food Insecurity   Worried About Running Out of Food in the Last Year: Never true   Ran Out of Food in the Last Year:  Never true  Transportation Needs: No Transportation Needs   Lack of Transportation (Medical): No   Lack of Transportation (Non-Medical): No  Physical Activity: Not on file  Stress: Not on file  Social Connections: Not on file  Intimate Partner Violence: Not on file    Family History  Problem Relation Age of Onset   Heart disease Father    Hypertension Father  Breast cancer Sister    Cancer Sister        breast   Colon cancer Neg Hx    Esophageal cancer Neg Hx    Pancreatic cancer Neg Hx    Liver disease Neg Hx      Review of Systems  Constitutional: Negative.  Negative for chills and fever.  Cardiovascular: Negative.  Negative for chest pain and palpitations.  Gastrointestinal:  Negative for abdominal pain, diarrhea, nausea and vomiting.  Genitourinary: Negative.   Skin: Negative.  Negative for rash.  Neurological: Negative.  Negative for dizziness and headaches.  All other systems reviewed and are negative.  Today's Vitals   07/24/21 1408  BP: (!) 144/82  Pulse: 78  Temp: 98 F (36.7 C)  TempSrc: Oral  SpO2: 94%  Weight: 247 lb (112 kg)  Height: '5\' 1"'  (1.549 m)   Body mass index is 46.67 kg/m.  Physical Exam Vitals reviewed.  Constitutional:      Appearance: She is obese.  HENT:     Head: Normocephalic.  Eyes:     Extraocular Movements: Extraocular movements intact.     Conjunctiva/sclera: Conjunctivae normal.     Pupils: Pupils are equal, round, and reactive to light.  Cardiovascular:     Rate and Rhythm: Normal rate and regular rhythm.     Pulses: Normal pulses.     Heart sounds: Normal heart sounds.  Pulmonary:     Effort: Pulmonary effort is normal.     Breath sounds: Normal breath sounds.  Musculoskeletal:     Cervical back: Normal range of motion and neck supple.  Skin:    General: Skin is warm and dry.     Capillary Refill: Capillary refill takes less than 2 seconds.  Neurological:     General: No focal deficit present.     Mental  Status: She is alert and oriented to person, place, and time.  Psychiatric:        Mood and Affect: Mood normal.        Behavior: Behavior normal.    DG Chest 2 View  Result Date: 07/24/2021 CLINICAL DATA:  Pneumonia, follow-up EXAM: CHEST - 2 VIEW COMPARISON:  07/17/2021 FINDINGS: Enlargement of cardiac silhouette with pulmonary vascular congestion. Atherosclerotic calcification aorta. Bronchitic changes with mild subsegmental atelectasis at RIGHT middle lobe. No acute infiltrate, pleural effusion, or pneumothorax. Bones demineralized. IMPRESSION: Enlargement of cardiac silhouette. Bronchitic changes with subsegmental atelectasis at RIGHT middle lobe. Aortic Atherosclerosis (ICD10-I70.0). Electronically Signed   By: Lavonia Dana M.D.   On: 07/24/2021 15:28    ASSESSMENT & PLAN: Problem List Items Addressed This Visit       Cardiovascular and Mediastinum   Hypertension associated with diabetes ([place])    Well-controlled hypertension.  Continue losartan, amlodipine, and verapamil. BP Readings from Last 3 Encounters:  07/24/21 130/70  07/17/21 124/74  07/04/21 130/88          Paroxysmal atrial fibrillation ([facility])   Aortic atherosclerosis ([place])     Respiratory   CAP (community acquired pneumonia) - Primary    Improving.  Continue and finish Levaquin.      Relevant Orders   DG Chest 2 View (Completed)     Endocrine   Type 2 diabetes mellitus with hyperglycemia, with long-term current use of insulin [facility])    Sees endocrinologist on a regular basis. Lab Results  Component Value Date   HGBA1C 7.8 (A) 05/24/2021         Acquired hypothyroidism    Clinically  euthyroid.  Continue Synthroid 75 mcg daily. Lab Results  Component Value Date   TSH 3.350 03/12/2021           Other   Long term (current) use of anticoagulants   Dysthymia   Relevant Orders   Ambulatory referral to Psychiatry   Patient Instructions  Health Maintenance After Age 30 After age 35, you are at a  higher risk for certain long-term diseases and infections as well as injuries from falls. Falls are a major cause of broken bones and head injuries in people who are older than age 69. Getting regular preventive care can help to keep you healthy and well. Preventive care includes getting regular testing and making lifestyle changes as recommended by your health care provider. Talk with your health care provider about: Which screenings and tests you should have. A screening is a test that checks for a disease when you have no symptoms. A diet and exercise plan that is right for you. What should I know about screenings and tests to prevent falls? Screening and testing are the best ways to find a health problem early. Early diagnosis and treatment give you the best chance of managing medical conditions that are common after age 13. Certain conditions and lifestyle choices may make you more likely to have a fall. Your health care provider may recommend: Regular vision checks. Poor vision and conditions such as cataracts can make you more likely to have a fall. If you wear glasses, make sure to get your prescription updated if your vision changes. Medicine review. Work with your health care provider to regularly review all of the medicines you are taking, including over-the-counter medicines. Ask your health care provider about any side effects that may make you more likely to have a fall. Tell your health care provider if any medicines that you take make you feel dizzy or sleepy. Osteoporosis screening. Osteoporosis is a condition that causes the bones to get weaker. This can make the bones weak and cause them to break more easily. Blood pressure screening. Blood pressure changes and medicines to control blood pressure can make you feel dizzy. Strength and balance checks. Your health care provider may recommend certain tests to check your strength and balance while standing, walking, or changing  positions. Foot health exam. Foot pain and numbness, as well as not wearing proper footwear, can make you more likely to have a fall. Depression screening. You may be more likely to have a fall if you have a fear of falling, feel emotionally low, or feel unable to do activities that you used to do. Alcohol use screening. Using too much alcohol can affect your balance and may make you more likely to have a fall. What actions can I take to lower my risk of falls? General instructions Talk with your health care provider about your risks for falling. Tell your health care provider if: You fall. Be sure to tell your health care provider about all falls, even ones that seem minor. You feel dizzy, sleepy, or off-balance. Take over-the-counter and prescription medicines only as told by your health care provider. These include any supplements. Eat a healthy diet and maintain a healthy weight. A healthy diet includes low-fat dairy products, low-fat (lean) meats, and fiber from whole grains, beans, and lots of fruits and vegetables. Home safety Remove any tripping hazards, such as rugs, cords, and clutter. Install safety equipment such as grab bars in bathrooms and safety rails on stairs. Keep rooms and walkways well-lit.  Activity  Follow a regular exercise program to stay fit. This will help you maintain your balance. Ask your health care provider what types of exercise are appropriate for you. If you need a cane or walker, use it as recommended by your health care provider. Wear supportive shoes that have nonskid soles. Lifestyle Do not drink alcohol if your health care provider tells you not to drink. If you drink alcohol, limit how much you have: 0-1 drink a day for women. 0-2 drinks a day for men. Be aware of how much alcohol is in your drink. In the [place] one drink equals one typical bottle of beer (12 oz), one-half glass of wine (5 oz), or one shot of hard liquor (1 oz). Do not use any  products that contain nicotine or tobacco, such as cigarettes and e-cigarettes. If you need help quitting, ask your health care provider. Summary Having a healthy lifestyle and getting preventive care can help to protect your health and wellness after age 7. Screening and testing are the best way to find a health problem early and help you avoid having a fall. Early diagnosis and treatment give you the best chance for managing medical conditions that are more common for people who are older than age 72. Falls are a major cause of broken bones and head injuries in people who are older than age 61. Take precautions to prevent a fall at home. Work with your health care provider to learn what changes you can make to improve your health and wellness and to prevent falls. This information is not intended to replace advice given to you by your health care provider. Make sure you discuss any questions you have with your health care provider. Document Revised: 12/14/2020 Document Reviewed: 09/21/2020 Elsevier Patient Education  2022 [place], MD [facility]

## 2021-07-24 NOTE — Telephone Encounter (Signed)
Chest x-ray report discussed with patient.

## 2021-07-30 ENCOUNTER — Other Ambulatory Visit: Payer: Self-pay

## 2021-07-30 ENCOUNTER — Ambulatory Visit (INDEPENDENT_AMBULATORY_CARE_PROVIDER_SITE_OTHER): Payer: Medicare Other | Admitting: Internal Medicine

## 2021-07-30 ENCOUNTER — Encounter: Payer: Self-pay | Admitting: Internal Medicine

## 2021-07-30 VITALS — BP 128/70 | HR 69 | Ht 60.0 in | Wt 247.0 lb

## 2021-07-30 DIAGNOSIS — I5032 Chronic diastolic (congestive) heart failure: Secondary | ICD-10-CM | POA: Diagnosis not present

## 2021-07-30 DIAGNOSIS — I152 Hypertension secondary to endocrine disorders: Secondary | ICD-10-CM

## 2021-07-30 DIAGNOSIS — E1159 Type 2 diabetes mellitus with other circulatory complications: Secondary | ICD-10-CM | POA: Diagnosis not present

## 2021-07-30 DIAGNOSIS — T8089XA Other complications following infusion, transfusion and therapeutic injection, initial encounter: Secondary | ICD-10-CM

## 2021-07-30 DIAGNOSIS — R109 Unspecified abdominal pain: Secondary | ICD-10-CM

## 2021-07-30 NOTE — Progress Notes (Signed)
Patient ID: [name], female   DOB: 03/06/1941, 80 y.o.   MRN: [id number]        Chief Complaint: follow up right buttock bruising lump, right abd pain       HPI:  [name] is a 80 y.o. female here with mention of 1 wk onset tender lump and bruising that is ? Worse after a right buttock antibiotic injection, overall mild,  constant, tender to touch, nothing else makes better or worse.  Denies worsening reflux, abd pain, dysphagia, n/v, bowel change or blood, except for several months worsening right sided abd discomfort with alternating constipatoin and dairrhea. Pt denies chest pain, increased sob or doe, wheezing, orthopnea, PND, increased LE swelling, palpitations, dizziness or syncope.   Pt denies polydipsia, polyuria, or new focal neuro s/s.   Pt denies fever, wt loss, night sweats, loss of appetite, or other constitutional symptoms          Wt Readings from Last 3 Encounters:  07/30/21 247 lb (112 kg)  07/24/21 247 lb (112 kg)  07/17/21 249 lb (112.9 kg)   BP Readings from Last 3 Encounters:  07/30/21 128/70  07/24/21 130/70  07/17/21 124/74         Past Medical History:  Diagnosis Date   Arthritis    Back pain    Chronic anticoagulation    due to aflutter   Chronic kidney disease    Diabetes mellitus    Diastolic CHF, chronic ([place])    a.  echo 2006 - ef 55-65%; mild diast dysfxn;    b. Echo 08/2011: Mild LVH, EF 60%;  c. 04/2013 Echo: EF 65-69%, mild conc LVH;  08/2014 Echo: EF 60-65%, mild-mod MR.   Gout    Hyperlipidemia    Hypertension    a.  Renal arterial Dopplers 12/2011: 1-59% right renal artery stenosis   Morbid obesity ([place])    Obstructive sleep apnea on CPAP    Paroxysmal Afib/Flutter    a. 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   Past Surgical History:  Procedure Laterality Date   APPENDECTOMY     ATRIAL FLUTTER ABLATION N/A 09/24/2011   Procedure: ATRIAL FLUTTER ABLATION;  Surgeon: [name], MD;  Location: [facility] CATH LAB;  Service:  Cardiovascular;  Laterality: N/A;   CARDIOVERSION  10/22/2011   Procedure: CARDIOVERSION;  Surgeon: [name], MD;  Location: [place];  Service: Cardiovascular;  Laterality: N/A;   CARDIOVERSION N/A 09/10/2011   Procedure: CARDIOVERSION;  Surgeon: [name], MD;  Location: [facility] CATH LAB;  Service: Cardiovascular;  Laterality: N/A;   CHOLECYSTECTOMY     COLONOSCOPY WITH PROPOFOL N/A 06/13/2021   Procedure: COLONOSCOPY WITH PROPOFOL;  Surgeon: Milus Banister, MD;  Location: [facility] ENDOSCOPY;  Service: Endoscopy;  Laterality: N/A;   POLYPECTOMY  06/13/2021   Procedure: POLYPECTOMY;  Surgeon: Milus Banister, MD;  Location: [facility] ENDOSCOPY;  Service: Endoscopy;;   [place]      reports that she has never smoked. She has never used smokeless tobacco. She reports that she does not drink alcohol and does not use drugs. family history includes Breast cancer in her sister; Cancer in her sister; Heart disease in her father; Hypertension in her father. Allergies  Allergen Reactions   Metformin Diarrhea   Current Outpatient Medications on File Prior to Visit  Medication Sig Dispense Refill   Accu-Chek Softclix Lancets lancets 1 each by Other route 3 (three) times daily. as directed 100 each 3  allopurinol (ZYLOPRIM) 300 MG tablet TAKE 1 TABLET(300 MG) BY MOUTH DAILY 90 tablet 3   amiodarone (PACERONE) 200 MG tablet Take 1 tablet (200 mg total) by mouth daily. 90 tablet 2   amLODipine (NORVASC) 5 MG tablet Take 1 tablet (5 mg total) by mouth daily. 90 tablet 0   amoxicillin-clavulanate (AUGMENTIN) 875-125 MG tablet Take 1 tablet by mouth 2 (two) times daily. 14 tablet 0   blood glucose meter kit and supplies Dispense based on patient and insurance preference. Use up to four times daily as directed. (FOR ICD-10 E10.9, E11.9). (Patient taking differently: 1 each by Other route See admin instructions. Dispense based on patient and insurance preference. Use up to  four times daily as directed. (FOR ICD-10 E10.9, E11.9).) 1 each 0   cloNIDine (CATAPRES) 0.3 MG tablet Take 1 tablet (0.3 mg total) by mouth 2 (two) times daily. 180 tablet 3   Continuous Blood Gluc Receiver (FREESTYLE LIBRE 14 DAY READER) DEVI Use as directed. 1 each 0   Continuous Blood Gluc Sensor (FREESTYLE LIBRE 14 DAY SENSOR) MISC Use as directed to check blood sugar daily 2 each 3   cyclobenzaprine (FLEXERIL) 5 MG tablet Take 1 tablet (5 mg total) by mouth as needed for muscle spasms. 30 tablet 0   diltiazem (CARDIZEM) 30 MG tablet Take 1 tablet (30 mg total) by mouth See admin instructions. Take one tablet every 4 hours as needed for heart rate greater than 100 and blood pressure needs to be above 100 15 tablet 2   ELIQUIS 5 MG TABS tablet TAKE 1 TABLET(5 MG) BY MOUTH TWICE DAILY 180 tablet 1   Insulin Lispro Prot & Lispro (HUMALOG MIX 75/25 KWIKPEN) (75-25) 100 UNIT/ML Kwikpen Inject 17 Units into the skin 2 (two) times daily. 30 mL 3   lidocaine (LIDODERM) 5 % Place 1 patch onto the skin daily. Remove & Discard patch within 12 hours or as directed by MD (Patient taking differently: Place 1 patch onto the skin daily as needed (Pain). Remove & Discard patch within 12 hours or as directed by MD) 30 patch 0   loperamide (IMODIUM) 2 MG capsule Take 1 capsule (2 mg total) by mouth 4 (four) times daily as needed for diarrhea or loose stools. 12 capsule 0   losartan (COZAAR) 100 MG tablet Take 1 tablet (100 mg total) by mouth daily. 90 tablet 3   Na Sulfate-K Sulfate-Mg Sulf (SUPREP BOWEL PREP KIT) 17.5-3.13-1.6 GM/177ML SOLN Take 1 kit by mouth as directed. 324 mL 0   NOVOFINE PLUS PEN NEEDLE 32G X 4 MM MISC USE TWICE DAILY AS DIRECTED (Patient taking differently: 1 each by Other route as directed.) 100 each 11   Olopatadine HCl 0.2 % SOLN Place 1 drop into the right eye daily.     ONETOUCH VERIO test strip 1 each by Other route 3 (three) times daily.     potassium chloride (KLOR-CON) 10 MEQ  tablet Take 2 tablets (20 mEq total) by mouth daily. 180 tablet 3   RESTASIS MULTIDOSE 0.05 % ophthalmic emulsion Place 1 drop into both eyes 2 (two) times daily.      rosuvastatin (CRESTOR) 10 MG tablet TAKE 1 TABLET BY MOUTH IN  THE EVENING (Patient taking differently: Take 10 mg by mouth every evening.) 90 tablet 3   Semaglutide, 1 MG/DOSE, (OZEMPIC, 1 MG/DOSE,) 4 MG/3ML SOPN Inject 1 mg into the skin once a week. 9 mL 3   torsemide (DEMADEX) 20 MG tablet Take 1 tablet (20  mg total) by mouth daily.     triamcinolone cream (KENALOG) 0.1 % Apply 1 application topically 2 (two) times daily. (Patient taking differently: Apply 1 application topically daily as needed (Rash under breast).) 30 g 0   verapamil (CALAN) 120 MG tablet Take 1 tablet (120 mg total) by mouth in the morning.     levothyroxine (SYNTHROID) 75 MCG tablet Take 1 tablet (75 mcg total) by mouth daily before breakfast. 90 tablet 1   No current facility-administered medications on file prior to visit.        ROS:  All others reviewed and negative.  Objective        PE:  BP 128/70 (BP Location: Right Arm, Patient Position: Sitting, Cuff Size: Large)   Pulse 69   Ht 5' (1.524 m)   Wt 247 lb (112 kg)   SpO2 97%   BMI 48.24 kg/m                 Constitutional: Pt appears in NAD               HENT: Head: NCAT.                Right Ear: External ear normal.                 Left Ear: External ear normal.                Eyes: . Pupils are equal, round, and reactive to light. Conjunctivae and EOM are normal               Nose: without d/c or deformity               Neck: Neck supple. Gross normal ROM               Cardiovascular: Normal rate and regular rhythm.                 Pulmonary/Chest: Effort normal and breath sounds without rales or wheezing.                Abd:  Soft, NT, ND, + BS, no organomegaly               Neurological: Pt is alert. At baseline orientation, motor grossly intact               Skin: LE edema - trace  bilaterl, right buttock in injection site 1 cm area mild tender hematoma with some bruising tracking caudally toward the leg               Psychiatric: Pt behavior is normal without agitation   Micro: none  Cardiac tracings I have personally interpreted today:  none  Pertinent Radiological findings (summarize): none   Lab Results  Component Value Date   WBC 11.2 (H) 07/17/2021   HGB 11.7 (L) 07/17/2021   HCT 37.1 07/17/2021   PLT 352.0 07/17/2021   GLUCOSE 117 (H) 07/17/2021   CHOL 135 09/12/2020   TRIG 84 09/12/2020   HDL 62 09/12/2020   LDLCALC 57 09/12/2020   ALT 15 07/17/2021   AST 18 07/17/2021   NA 139 07/17/2021   K 3.5 07/17/2021   CL 99 07/17/2021   CREATININE 0.61 07/17/2021   BUN 9 07/17/2021   CO2 31 07/17/2021   TSH 3.350 03/12/2021   INR 2.0 06/15/2020   HGBA1C 7.8 (A) 05/24/2021   MICROALBUR 6.3 (H) 06/21/2020   Assessment/Plan:  [name]  is a 80 y.o. Other or two or more races [6] female with  has a past medical history of Arthritis, Back pain, Chronic anticoagulation, Chronic kidney disease, Diabetes mellitus, Diastolic CHF, chronic ([place]), Gout, Hyperlipidemia, Hypertension, Morbid obesity ([place]), Obstructive sleep apnea on CPAP, and Paroxysmal Afib/Flutter.  No problem-specific Assessment & Plan notes found for this encounter.  Followup: No follow-ups on file.  Cathlean Cower, MD 07/30/2021 11:38 AM [facility]

## 2021-07-30 NOTE — Patient Instructions (Signed)
Your bruising to the right buttock area should heal in the next 4 weeks  Ok to try OTC stool softner such as Colace 100 mg twice per day as needed  Please continue all other medications as before, and refills have been done if requested.  Please have the pharmacy call with any other refills you may need.  Please continue your efforts at being more active, low cholesterol diet, and weight control.  Please keep your appointments with your specialists as you may have planned

## 2021-08-01 ENCOUNTER — Telehealth: Payer: Self-pay | Admitting: Podiatry

## 2021-08-01 NOTE — Telephone Encounter (Signed)
Pt left message stating she was here and tried on her diabetic shoes but they did not fit several months ago and she has not heard anything back.  Upon checking I do not see where we reordered her shoes or returned the ones that did not fit. PT stated she did not take them as they were way to big. I don't know what size I would need to order.   Dr Elisha Ponder do you remember this pt not taking the shoes/inserts.  I told pt I would call her back to get her scheduled to be remeasured and reorder the shoes.

## 2021-08-02 ENCOUNTER — Telehealth: Payer: Self-pay | Admitting: Podiatry

## 2021-08-02 NOTE — Telephone Encounter (Signed)
Lvm for pt to call to schedule an appt to pick out new diabetic shoes.

## 2021-08-03 ENCOUNTER — Encounter: Payer: Self-pay | Admitting: Internal Medicine

## 2021-08-03 DIAGNOSIS — R1010 Upper abdominal pain, unspecified: Secondary | ICD-10-CM | POA: Insufficient documentation

## 2021-08-03 DIAGNOSIS — T8089XA Other complications following infusion, transfusion and therapeutic injection, initial encounter: Secondary | ICD-10-CM | POA: Insufficient documentation

## 2021-08-03 DIAGNOSIS — R109 Unspecified abdominal pain: Secondary | ICD-10-CM | POA: Insufficient documentation

## 2021-08-03 NOTE — Assessment & Plan Note (Signed)
BP Readings from Last 3 Encounters:  07/30/21 128/70  07/24/21 130/70  07/17/21 124/74   Stable, pt to continue medical treatment norvasc, catapres, losartan

## 2021-08-03 NOTE — Assessment & Plan Note (Signed)
Exam benign, suspect mild IBS like issue, for colace with constipation but hold for loose stool,  to f/u any worsening symptoms or concerns

## 2021-08-03 NOTE — Assessment & Plan Note (Signed)
Stable volume, cont current tx,  to f/u any worsening symptoms or concerns

## 2021-08-03 NOTE — Assessment & Plan Note (Signed)
Benign issue, d/w pt natural history and should continue to heal, reassured, no other specific tx needed

## 2021-08-04 ENCOUNTER — Other Ambulatory Visit: Payer: Self-pay | Admitting: Emergency Medicine

## 2021-08-04 DIAGNOSIS — I152 Hypertension secondary to endocrine disorders: Secondary | ICD-10-CM

## 2021-08-04 DIAGNOSIS — E1159 Type 2 diabetes mellitus with other circulatory complications: Secondary | ICD-10-CM

## 2021-08-05 DIAGNOSIS — I10 Essential (primary) hypertension: Secondary | ICD-10-CM | POA: Diagnosis not present

## 2021-08-05 DIAGNOSIS — G4733 Obstructive sleep apnea (adult) (pediatric): Secondary | ICD-10-CM | POA: Diagnosis not present

## 2021-08-07 NOTE — Progress Notes (Deleted)
HPI  35 yoF never smoker followed for OSA, Asthma/ COPD, complicated by dCHF, Aortic Atherosclerosis, HTN, PAFib/Amiodarone/Eliquis, Varicose Veins, Restrictive Lung Disease, GERD, Nonalcoholic Fatty Liver, Hypothyroid, DM2/ neuropathy, GOUT, Morbid Obesity,  NPSG 06/05/11-AHI 20.1/ hr, desaturation to  87%, body weight 261 lbs, PFT 03/25/17- moderately severe obstruction, insignificant response to BD, Nl TLC, DLCO mildly reduced.  ================================================   04/05/21- 79 yoF never smoker followed for OSA, Asthma/ COPD, complicated by dCHF, Aortic Atherosclerosis, HTN, PAFib/Amiodarone/Eliquis, Varicose Veins, Restrictive Lung Disease, GERD, Nonalcoholic Fatty Liver, Hypothyroid, DM2/ neuropathy, GOUT, Morbid Obesity,  CPAP auto 5-15/ [employer]    replaced 2017   replacement ordered 01/03/21 Download- compliance 27%(short nights), AHI 0.4/ hr Body weight today-253 lbs Covid vax- Wants [place] to manage pulmonary and sleep. Has no bronchodilators. Denies any problems with her breathing- no cough or wheeze and no need for inhalers. Pending local move, which she expects to complete in "maybe a month". Bed packed up. Sleeping in recliner. Frequent nocturia- often leaves CPAP off as she gets back in bed. Supply chain delay- still awaiting replacement CPAP machine ordered in March. Comfortable with nasal pillow mask.  CXR 01/05/21- FINDINGS: Stable cardiomegaly. The hila and mediastinum are unchanged. No pneumothorax. No nodules or masses. No focal infiltrates. No overt pulmonary edema identified. IMPRESSION: Cardiomegaly.  No overt edema on today's study.  08/08/21- 79 yoF never smoker followed for OSA, Asthma/ COPD, complicated by dCHF, Aortic Atherosclerosis, HTN, PAFib/Amiodarone/Eliquis, Varicose Veins, Restrictive Lung Disease, GERD, Nonalcoholic Fatty Liver, Hypothyroid, DM2/ neuropathy, GOUT, Morbid Obesity,  CPAP auto 5-15/ [employer]    replaced 2017   replacement ordered 01/03/21 NP  visit 07/04/21- had been off CPAP after dental surgery. Was having R shoulder pain after falls> heat. Download- Body weight- Covid vax- Flu vax-                                                 ? Need inhalers for COPD  CXR 07/24/21-  Enlargement of cardiac silhouette. Bronchitic changes with subsegmental atelectasis at RIGHT middle lobe. Aortic Atherosclerosis (ICD10-I70.0).  ROS-see HPI   + = positive Constitutional:    weight loss, night sweats, fevers, chills, fatigue, lassitude. HEENT:    headaches, difficulty swallowing, tooth/dental problems, sore throat,       sneezing, itching, ear ache, nasal congestion, post nasal drip, snoring CV:    chest pain, orthopnea, PND, swelling in lower extremities, anasarca,                                   dizziness, palpitations Resp:   +shortness of breath with exertion or at rest.                productive cough,   non-productive cough, coughing up of blood.              change in color of mucus.  wheezing.   Skin:    rash or lesions. GI:  No-   heartburn, indigestion, abdominal pain, nausea, vomiting, diarrhea,                 change in bowel habits, loss of appetite GU: dysuria, change in color of urine, +urgency or frequency.   flank pain. MS:   joint pain, stiffness, decreased range of motion, back pain. Neuro-  nothing unusual Psych:  change in mood or affect.  depression or anxiety.   memory loss.  OBJ- Physical Exam General- Alert, Oriented, Affect-appropriate, Distress- none acute, + morbidly obese, + rolling walker Skin- rash-none, lesions- none, excoriation- none Lymphadenopathy- none Head- atraumatic            Eyes- Gross vision intact, PERRLA, conjunctivae and secretions clear,             Ears- Hearing, canals-normal            Nose- Clear, no-Septal dev, mucus, polyps, erosion, perforation             Throat- Mallampati II-III , mucosa clear , drainage- none, tonsils- absent, + teeth Neck- flexible , trachea midline, no  stridor , thyroid nl, carotid no bruit Chest - symmetrical excursion , unlabored           Heart/CV- RRR , + murmur 1-2+S , no gallop  , no rub, nl s1 s2                           - JVD-none, edema- none, stasis changes- none, varices- none           Lung- clear to P&A, no rales or crackles, wheeze- none, cough- none , dullness-none, rub- none           Chest wall-  Abd-  Br/ Gen/ Rectal- Not done, not indicated Extrem- cyanosis- none, clubbing, none, atrophy- none, strength- nl Neuro- grossly intact to observation

## 2021-08-08 ENCOUNTER — Ambulatory Visit: Payer: Medicare Other | Admitting: Internal Medicine

## 2021-08-11 NOTE — Progress Notes (Signed)
HPI F never smoker followed for OSA, Asthma/ COPD, complicated by dCHF, Aortic Atherosclerosis, HTN, PAFib/Amiodarone/Eliquis, Varicose Veins, Restrictive Lung Disease, GERD, Nonalcoholic Fatty Liver, Hypothyroid, DM2/ neuropathy, GOUT, Morbid Obesity,  NPSG 06/05/11-AHI 20.1/ hr, desaturation to  87%, body weight 261 lbs, CPAP 10 PFT 03/25/17- moderately severe obstruction, insignificant response to BD, Nl TLC, DLCO mildly reduced. ==============================================================================   04/05/21- 79 yoF never smoker followed for OSA, Asthma/ COPD, complicated by dCHF, Aortic Atherosclerosis, HTN, PAFib/Amiodarone/Eliquis, Varicose Veins, Restrictive Lung Disease, GERD, Nonalcoholic Fatty Liver, Hypothyroid, DM2/ neuropathy, GOUT, Morbid Obesity,  CPAP auto 5-15/ [employer]    replaced 2017   replacement ordered 01/03/21 Download- compliance 27%(short nights), AHI 0.4/ hr Body weight today-253 lbs Covid vax- Wants [place] to manage pulmonary and sleep. Has no bronchodilators. Denies any problems with her breathing- no cough or wheeze and no need for inhalers. Pending local move, which she expects to complete in "maybe a month". Bed packed up. Sleeping in recliner. Frequent nocturia- often leaves CPAP off as she gets back in bed. Supply chain delay- still awaiting replacement CPAP machine ordered in March. Comfortable with nasal pillow mask.  CXR 01/05/21- FINDINGS: Stable cardiomegaly. The hila and mediastinum are unchanged. No pneumothorax. No nodules or masses. No focal infiltrates. No overt pulmonary edema identified. IMPRESSION: Cardiomegaly.  No overt edema on today's study.  08/12/21- 79 yoF never smoker followed for OSA, Asthma/ COPD, complicated by dCHF, Aortic Atherosclerosis, HTN, PAFib/Amiodarone/Eliquis, Varicose Veins, Restrictive Lung Disease, GERD, Nonalcoholic Fatty Liver, Hypothyroid, DM2/ neuropathy, GOUT, Morbid Obesity,            -no bronchodilators CPAP auto  5-15/ [employer]    replaced 2017   replacement ordered 01/03/21 Download- lapsed after oral surgery, has restarted. Body weight today-244 lbs Covid vax-none Flu vax-no -----Patient says she started using her CPAP again says she stopped due to surgery issues. Feels like she is sleeping good.  Says she is not coughing at all.  We discussed chest x-ray showing right middle lobe atelectasis after pneumonia.  She complains of constipation and gas pains bothering sleep. CXR 07/24/21- IMPRESSION: Enlargement of cardiac silhouette. Bronchitic changes with subsegmental atelectasis at RIGHT middle lobe. Aortic Atherosclerosis (ICD10-I70.0).   ROS-see HPI   + = positive Constitutional:    weight loss, night sweats, fevers, chills, fatigue, lassitude. HEENT:    headaches, difficulty swallowing, tooth/dental problems, sore throat,       sneezing, itching, ear ache, nasal congestion, post nasal drip, snoring CV:    chest pain, orthopnea, PND, swelling in lower extremities, anasarca,                                   dizziness, palpitations Resp:   +shortness of breath with exertion or at rest.                productive cough,   non-productive cough, coughing up of blood.              change in color of mucus.  wheezing.   Skin:    rash or lesions. GI:  No-   heartburn, indigestion, +abdominal pain, nausea, vomiting, diarrhea,                 +change in bowel habits, loss of appetite GU: dysuria, change in color of urine, +urgency or frequency.   flank pain. MS:   joint pain, stiffness, decreased range of motion, back pain. Neuro-  nothing unusual Psych:  change in mood or affect.  depression or anxiety.   memory loss.  OBJ- Physical Exam General- Alert, Oriented, Affect-appropriate, Distress- none acute, + morbidly obese, + rolling walker Skin- rash-none, lesions- none, excoriation- none Lymphadenopathy- none Head- atraumatic            Eyes- Gross vision intact, PERRLA, conjunctivae and  secretions clear,             Ears- Hearing, canals-normal            Nose- Clear, no-Septal dev, mucus, polyps, erosion, perforation             Throat- Mallampati II-III , mucosa clear , drainage- none, tonsils- absent,  + teeth Neck- flexible , trachea midline, no stridor , thyroid nl, carotid no bruit Chest - symmetrical excursion , unlabored           Heart/CV- RRR , + murmur 1-2+S , no gallop  , no rub, nl s1 s2                           - JVD-none, edema- none, stasis changes- none, varices- none           Lung- clear to P&A, no rales or crackles, wheeze- none, cough- none , dullness-none, rub- none           Chest wall-  Abd-  Br/ Gen/ Rectal- Not done, not indicated Extrem- cyanosis- none, clubbing, none, atrophy- none, strength- nl Neuro- grossly intact to observation

## 2021-08-12 ENCOUNTER — Ambulatory Visit (INDEPENDENT_AMBULATORY_CARE_PROVIDER_SITE_OTHER): Payer: Medicare Other | Admitting: Internal Medicine

## 2021-08-12 ENCOUNTER — Encounter: Payer: Self-pay | Admitting: Internal Medicine

## 2021-08-12 ENCOUNTER — Telehealth: Payer: Medicare Other

## 2021-08-12 ENCOUNTER — Ambulatory Visit (INDEPENDENT_AMBULATORY_CARE_PROVIDER_SITE_OTHER): Payer: Medicare Other

## 2021-08-12 ENCOUNTER — Other Ambulatory Visit: Payer: Self-pay

## 2021-08-12 VITALS — BP 114/70 | HR 70 | Temp 98.3°F | Ht 60.0 in | Wt 244.2 lb

## 2021-08-12 DIAGNOSIS — J209 Acute bronchitis, unspecified: Secondary | ICD-10-CM | POA: Diagnosis not present

## 2021-08-12 DIAGNOSIS — G4733 Obstructive sleep apnea (adult) (pediatric): Secondary | ICD-10-CM

## 2021-08-12 DIAGNOSIS — Z9989 Dependence on other enabling machines and devices: Secondary | ICD-10-CM

## 2021-08-12 DIAGNOSIS — J984 Other disorders of lung: Secondary | ICD-10-CM

## 2021-08-12 DIAGNOSIS — J440 Chronic obstructive pulmonary disease with acute lower respiratory infection: Secondary | ICD-10-CM

## 2021-08-12 MED ORDER — DOXYCYCLINE HYCLATE 100 MG PO TABS: 100.0000 mg | ORAL_TABLET | Freq: Two times a day (BID) | ORAL | 0 refills | Status: DC

## 2021-08-12 NOTE — Patient Instructions (Signed)
Order- DME [employer]- renewal of order to replace old CPAP machine auto 5-15, mask of choice, humidifier, supplies,s AirView/ card  [name]- please try to get back to using CPAP all night, every night, as soon as you can recover from your surgery. If necessary, we can help get you a different style of mask to use with your CPAP.   For constipation you can try Colace, Metamucil or Senokot without prescription

## 2021-08-13 ENCOUNTER — Telehealth: Payer: Self-pay | Admitting: Internal Medicine

## 2021-08-13 NOTE — Telephone Encounter (Signed)
Called and spoke with pt and she stated that she was told that her lungs sounded good at her appt yesterday and then she said that when she gets home she has a call that says she has PNA.  Pt stated that she does not understand why CY did not tell her this while she was in the office?  Pt requested that this message be sent to [facility] for clarification.  CY please advise. thanks

## 2021-08-13 NOTE — Telephone Encounter (Signed)
Her breath sounds were quiet in the lung bases, with shallow airflow. There was no rattle or wheeze. Physical exam has its limitations, which is why we also do chest xray. I hope she feels better quickly- please ask her to let us know if this doesn't happen.

## 2021-08-14 ENCOUNTER — Other Ambulatory Visit (HOSPITAL_COMMUNITY): Payer: Self-pay | Admitting: *Deleted

## 2021-08-14 MED ORDER — DILTIAZEM HCL 30 MG PO TABS: 30.0000 mg | ORAL_TABLET | ORAL | 2 refills | Status: DC

## 2021-08-14 NOTE — Telephone Encounter (Signed)
I called the patient and gave her the recommendations per Dr. Annamaria Boots and she was appreciative of the call and the explanation. She is aware to call the office back if she is not feeling better after taking the ABT that the provider prescribed. Nothing further needed.

## 2021-08-15 ENCOUNTER — Telehealth: Payer: Self-pay | Admitting: Gastroenterology

## 2021-08-15 NOTE — Telephone Encounter (Signed)
Received page to on call. Patient was started on doxycycline by [facility] for PNA earlier this week and calling tonight with new onset upper abdominal pain. No n/v/f/c/d/c. Able to tolerate PO intake w/o issue, but new upper abdominal pain since starting Doxy.   Recommended starting Prilosec 20 mg BID OTC in the event of early gastritis or PUD, stopping Doxy, and patient to call [facility] Clinic in the AM to change Abx. Discussed precautions to either call back or go to ER, to include increasing pain, fever, n/v, not tolerating PO, GI bleeding, or other concerns. All questions answered an appreciative of call back.

## 2021-08-16 ENCOUNTER — Ambulatory Visit (INDEPENDENT_AMBULATORY_CARE_PROVIDER_SITE_OTHER): Payer: Medicare Other | Admitting: *Deleted

## 2021-08-16 ENCOUNTER — Telehealth: Payer: Self-pay | Admitting: Internal Medicine

## 2021-08-16 ENCOUNTER — Ambulatory Visit: Payer: Medicare Other | Admitting: Internal Medicine

## 2021-08-16 ENCOUNTER — Other Ambulatory Visit: Payer: Self-pay | Admitting: *Deleted

## 2021-08-16 DIAGNOSIS — E1142 Type 2 diabetes mellitus with diabetic polyneuropathy: Secondary | ICD-10-CM

## 2021-08-16 DIAGNOSIS — Z794 Long term (current) use of insulin: Secondary | ICD-10-CM

## 2021-08-16 DIAGNOSIS — I5032 Chronic diastolic (congestive) heart failure: Secondary | ICD-10-CM

## 2021-08-16 MED ORDER — AMOXICILLIN-POT CLAVULANATE 500-125 MG PO TABS: 1.0000 | ORAL_TABLET | Freq: Two times a day (BID) | ORAL | 0 refills | Status: DC

## 2021-08-16 MED ORDER — VERAPAMIL HCL 120 MG PO TABS: 120.0000 mg | ORAL_TABLET | Freq: Every morning | ORAL | 3 refills | Status: DC

## 2021-08-16 NOTE — Telephone Encounter (Signed)
I am sorry she didn't tolerate doxycycline.  Order- stop doxy. Order Zpak  250 mg, # 6   2 today then one daily. This may not be sufficient by itself, but should be easier on her stomach and at least start on clearing her pneumonia while her stomach settles down. Please ask her to call us on Monday to let us know how she is doing.

## 2021-08-16 NOTE — Telephone Encounter (Signed)
Augmentin has been sent to preferred pharmacy.  Patient is aware and voiced her understanding.  Nothing further needed at this time.

## 2021-08-16 NOTE — Telephone Encounter (Signed)
[place] to augmentin 500    # 14, 1 twice daily with food.

## 2021-08-16 NOTE — Telephone Encounter (Signed)
Doxy was prescribed on 08/12/2021.  Spoke to patient, who reports on severe stomach pain, nausea and chills since starting doxycycline. Pain is in the middle of stomach and developed one day after starting course. Pain is not sharpe. Patient stated that her stomach feels full.  Pain was so severe last night that she thought she was going to have to call EMS. Denies f/s,v/d or additional sx.   Dr. Annamaria Boots, please advise. Thanks  Current Outpatient Medications on File Prior to Visit  Medication Sig Dispense Refill   Accu-Chek Softclix Lancets lancets 1 each by Other route 3 (three) times daily. as directed 100 each 3   allopurinol (ZYLOPRIM) 300 MG tablet TAKE 1 TABLET(300 MG) BY MOUTH DAILY 90 tablet 3   amiodarone (PACERONE) 200 MG tablet Take 1 tablet (200 mg total) by mouth daily. 90 tablet 2   amLODipine (NORVASC) 5 MG tablet Take 1 tablet (5 mg total) by mouth daily. 90 tablet 0   blood glucose meter kit and supplies Dispense based on patient and insurance preference. Use up to four times daily as directed. (FOR ICD-10 E10.9, E11.9). (Patient taking differently: 1 each by Other route See admin instructions. Dispense based on patient and insurance preference. Use up to four times daily as directed. (FOR ICD-10 E10.9, E11.9).) 1 each 0   cloNIDine (CATAPRES) 0.3 MG tablet Take 1 tablet (0.3 mg total) by mouth 2 (two) times daily. 180 tablet 3   Continuous Blood Gluc Receiver (FREESTYLE LIBRE 14 DAY READER) DEVI Use as directed. 1 each 0   Continuous Blood Gluc Sensor (FREESTYLE LIBRE 14 DAY SENSOR) MISC Use as directed to check blood sugar daily 2 each 3   cyclobenzaprine (FLEXERIL) 5 MG tablet Take 1 tablet (5 mg total) by mouth as needed for muscle spasms. 30 tablet 0   diltiazem (CARDIZEM) 30 MG tablet Take 1 tablet (30 mg total) by mouth See admin instructions. Take one tablet every 4 hours as needed for heart rate greater than 100 and blood pressure needs to be above 100 45 tablet 2    doxycycline (VIBRA-TABS) 100 MG tablet Take 1 tablet (100 mg total) by mouth 2 (two) times daily. 14 tablet 0   ELIQUIS 5 MG TABS tablet TAKE 1 TABLET(5 MG) BY MOUTH TWICE DAILY 180 tablet 1   Insulin Lispro Prot & Lispro (HUMALOG MIX 75/25 KWIKPEN) (75-25) 100 UNIT/ML Kwikpen Inject 17 Units into the skin 2 (two) times daily. 30 mL 3   levothyroxine (SYNTHROID) 75 MCG tablet Take 1 tablet (75 mcg total) by mouth daily before breakfast. 90 tablet 1   lidocaine (LIDODERM) 5 % Place 1 patch onto the skin daily. Remove & Discard patch within 12 hours or as directed by MD (Patient taking differently: Place 1 patch onto the skin daily as needed (Pain). Remove & Discard patch within 12 hours or as directed by MD) 30 patch 0   loperamide (IMODIUM) 2 MG capsule Take 1 capsule (2 mg total) by mouth 4 (four) times daily as needed for diarrhea or loose stools. 12 capsule 0   losartan (COZAAR) 100 MG tablet TAKE 1 TABLET(100 MG) BY MOUTH DAILY 90 tablet 3   Na Sulfate-K Sulfate-Mg Sulf (SUPREP BOWEL PREP KIT) 17.5-3.13-1.6 GM/177ML SOLN Take 1 kit by mouth as directed. 324 mL 0   NOVOFINE PLUS PEN NEEDLE 32G X 4 MM MISC USE TWICE DAILY AS DIRECTED (Patient taking differently: 1 each by Other route as directed.) 100 each 11   Olopatadine HCl 0.2 %  SOLN Place 1 drop into the right eye daily.     ONETOUCH VERIO test strip 1 each by Other route 3 (three) times daily.     potassium chloride (KLOR-CON) 10 MEQ tablet Take 2 tablets (20 mEq total) by mouth daily. 180 tablet 3   RESTASIS MULTIDOSE 0.05 % ophthalmic emulsion Place 1 drop into both eyes 2 (two) times daily.      rosuvastatin (CRESTOR) 10 MG tablet TAKE 1 TABLET BY MOUTH IN  THE EVENING (Patient taking differently: Take 10 mg by mouth every evening.) 90 tablet 3   Semaglutide, 1 MG/DOSE, (OZEMPIC, 1 MG/DOSE,) 4 MG/3ML SOPN Inject 1 mg into the skin once a week. 9 mL 3   torsemide (DEMADEX) 20 MG tablet Take 1 tablet (20 mg total) by mouth daily.      triamcinolone cream (KENALOG) 0.1 % Apply 1 application topically 2 (two) times daily. (Patient taking differently: Apply 1 application topically daily as needed (Rash under breast).) 30 g 0   verapamil (CALAN) 120 MG tablet Take 1 tablet (120 mg total) by mouth in the morning.     No current facility-administered medications on file prior to visit.    Allergies  Allergen Reactions   Metformin Diarrhea

## 2021-08-16 NOTE — Patient Instructions (Signed)
Visit Information  [name], it was nice talking with you today.   Please read over the attached information about antibiotics and probiotics, as we discussed today   I look forward to talking to you again for a telephone update on Wednesday October 09, 2021 at 3:30 pm- please be listening out for my call that day.  I will call as close to 3:30 pm as possible.   If you need to cancel or re-schedule our telephone visit, please call [phone] and one of our care guides will be happy to assist you.   I look forward to hearing about your progress.   Please don't hesitate to contact me if I can be of assistance to you before our next scheduled telephone appointment.   [name], RN, BSN, [facility] RN Care Coordination- [place] [phone]: direct office [phone]: mobile   PATIENT GOALS:  Goals Addressed             This Visit's Progress    Monitor and Manage My Blood Sugar-Diabetes Type 2   On track    Timeframe:  Long-Range Goal Priority:  Medium Start Date:     03/25/21                        Expected End Date:      03/25/22                 Follow Up Date 10/09/21   Keep up the great work monitoring and writing down your blood sugars at home: first thing in the morning before eating and then again 2 hours after a meal: this will help your doctors know if your medications are working or if they need adjusting: we will review these values each time we talk over the phone  Check blood sugar if I feel it is too high or too low; if you experience ongoing low blood sugars less than 70-- please let Dr. Quin Hoop office know Take your blood sugars from home to all doctor visits  Continue keeping a close eye on your feet and contact your diabetes doctor or your primary care doctor if you develop concerns about the appearance of your feet Try to stay as active as possible Keep trying to follow a low carbohydrate and low sugar diet Continues  taking your insulin and your Ozempic as it is prescribed   Why is this important?   Checking your blood sugar at home helps to keep it from getting very high or very low.  Writing the results in a diary or log helps the doctor know how to care for you.  Your blood sugar log should have the time, date and the results.  Also, write down the amount of insulin or other medicine that you take.  Other information, like what you ate, exercise done and how you were feeling, will also be helpful.          Track and Manage Fluids and Swelling-Heart Failure   On track    Timeframe:  Long-Range Goal Priority:  Medium Start Date:   03/25/21                          Expected End Date:       03/25/22                Follow Up Date 10/09/21   [name] job weighing daily and  writing it down on paper Call cardiology office if I gain more than 2 pounds in one day or 5 pounds in one week: Great job contacting your cardiologist when you experience weight gain at home Do ankle pumps throughout the day when sitting Keep legs up while sitting Keep up following a "No Added Salt diet" and continue watching the food labels for salt content with the foods you eat and prepare  Watch for swelling in feet, ankles and legs every day Wear the compression stockings that the cardiology staff recommended on days that you notice swelling in your feet/ ankles   Why is this important?   It is important to check your weight daily and watch how much salt and liquids you have.  It will help you to manage your heart failure.           Antibiotic Medicine, Adult Antibiotic medicines are used to treat infections caused by bacteria, such as strep throat and urinary tract infection (UTI). Antibiotic medicines will not work for colds, the flu (influenza), or other illnesses caused by viruses. These medicines work by killing the bacteria that are making you sick. Antibiotics can also have serious side effects. It is important that  you take antibiotic medicines safely and only when needed. When do I need to take antibiotics? You may need antibiotics for: UTI. Strep throat. Bacterial sinusitis. Meningitis. This infection affects the spinal cord and brain. Serious lung infection. You may start antibiotics while your health care provider waits for your results from any tests for possible infection. Tests may include a culture of your throat, urine, blood, or mucus. Your health care provider may change or stop your antibiotic depending on your test results. When are antibiotics not needed? You do not need antibiotics for most common illnesses. These illnesses may be caused by a virus, not by bacteria. You do not need antibiotics for: The common cold. Influenza. Sore throat. Discolored mucus. Bronchitis. Antibiotics are not always needed for all infections caused by bacteria. Many of these infections clear up without antibiotic treatment. Do not ask for or take antibiotics when they are not necessary. How long should I take my antibiotic? You must take the entire prescription. Continue to take your antibiotic for as long as told by your health care provider. Do not stop taking it even if you start to feel better. If you stop taking it too soon: You may start to feel sick again. Your infection may become harder to treat. Each course of antibiotics needs a different amount of time to work. Some antibiotic courses last only a few days. Some last about a week to 10 days. In some cases, you may need to take antibiotics for a few weeks to completely treat your infection. What if I miss a dose? Try not to miss any doses of medicine. If you miss a dose, call your health care provider or pharmacist for advice. Sometimes it is okay to take the missed dose as soon as possible. Do not take double or extra doses. What are the risks of taking antibiotics? Antibiotics can cause: Allergic reactions. Nausea. Yeast infections. Liver  problems. Antibiotics can also cause an infection called Clostridioides difficile (C. difficile or C. diff), which causes severe diarrhea. This infection happens when the antibiotics kill the healthy bacteria in your intestines. This allows C. diff to grow. C. diff needs to be treated right away. Let your health care provider know if: You develop diarrhea while taking an antibiotic. You develop  diarrhea after you stop taking an antibiotic. C. diff infection can start weeks after stopping the antibiotic. Taking an antibiotic also puts you at risk for getting sick in the future with bacteria that do not respond to medicine (antibiotic-resistant infection). Antibiotics can cause bacteria to change so that if the antibiotic is taken again, the medicine cannot kill the bacteria. These infections can be more serious and, in some cases, life-threatening. Do antibiotics affect birth control? Birth control pills may not work while you are on antibiotics. If you are taking birth control pills, continue taking them as usual and use a second form of birth control, such as a condom, to avoid unwanted pregnancy. Continue using the second form of birth control until your health care provider says you can stop. What else should I know about taking antibiotics? It is important for you to take antibiotics exactly as told. Make sure to: Take the correct amount of medicine at the same time each day. Ask your health care provider: How long to wait between doses. If your antibiotic should be taken with food. If there are any foods, drinks, or medicines that you should avoid while taking your antibiotics. If there are any side effects you should be aware of. Use only the antibiotics prescribed for you by your health care provider. Do not use antibiotics prescribed for someone else. Drink a large glass of water when taking your antibiotics. Drink enough fluid to keep your urine pale yellow. Ask your pharmacist for a  syringe, cup, or spoon that properly measures your antibiotics. Throw away any leftover medicine. Follow these instructions at home: Take over-the-counter and prescription medicines as told by your health care provider. Return to your normal activities as told by your health care provider. Ask your health care provider what activities are safe for you. Keep all follow-up visits as told by your health care provider. This is important. Contact a health care provider if: Your symptoms get worse. You have new joint pain or muscle aches that begin after starting your antibiotic. You have side effects from your antibiotic, such as: Stomach pain. Diarrhea. Nausea. White patches in your mouth or throat. Get help right away if: You have signs of a severe allergic reaction to antibiotics. If you have any of these signs, stop taking the antibiotic right away. Signs may include: Hives. These are raised, itchy, red bumps on your skin. Skin rash. Trouble breathing. Noisy breathing (wheezing). Swelling anywhere on your body. Feeling dizzy. Vomiting. You have signs of liver problems, such as: Dark or blood-colored urine. Yellow color to your skin. Bruising or bleeding easily. You have severe diarrhea, and you have cramps in your abdomen. You have a severe headache. These symptoms may represent a serious problem that is an emergency. Do not wait to see if the symptoms will go away. Get medical help right away. Call your local emergency services (911 in the [place] Do not drive yourself to the hospital. Summary Antibiotic medicines are used to treat infections caused by bacteria. It is important that you take antibiotic medicines safely and only when needed. Your health care provider may change or stop your antibiotic depending on your results from certain tests. Finish all antibiotic medicine even when you start to feel better. This information is not intended to replace advice given to you by your  health care provider. Make sure you discuss any questions you have with your health care provider. Document Revised: 11/21/2019 Document Reviewed: 07/26/2019 Elsevier Patient Education  2022 [employer]  Probiotics Probiotics are the good bacteria and yeasts that live in your body and keep your digestive system healthy. Probiotics also help your body's defense system (immune system) and protect your body against the growth of harmful bacteria. Your health care provider may recommend taking a probiotic if you are taking antibiotics or have certain medical conditions, such as: Diarrhea. Constipation. Irritable bowel syndrome. Lung infections. Yeast infections. Acne, eczema, and other skin conditions. Frequent urinary tract infections. What affects the balance of bacteria in my body? The balance of good bacteria in your body can be affected by: Antibiotic medicines. These medicines treat infections caused by bacteria. Unfortunately, they may kill the good bacteria in your body as well as the bad bacteria. Certain medical conditions. Conditions related to an imbalance of bacteria include: Stomach and intestine (gastrointestinal) infections. Lung infections. Skin infections. Vaginal infections. Inflammatory bowel diseases. Stomach ulcers (gastric ulcers). Tooth decay and gum disease (periodontal disease). Stress. Poor diet. What type of probiotic is right for me? Probiotics contain different types of bacteria (strains). Strains commonly found in probiotics include: Lactobacillus. Saccharomyces. Bifidobacterium. Specific strains have been shown to be more effective for certain health conditions. Ask your health care provider which strain or strains you should use and how often. Probiotics come in many different forms, strain combinations, and strengths. Some may need to be refrigerated. Always read the label for storage and usage instructions. Certain foods, such as yogurt, contain  probiotics. Probiotics can also be bought as a supplement at a pharmacy, health food store, or grocery store. Talk to your health care provider before starting any supplement. What are the side effects of probiotics? Some people have side effects when taking probiotics. Side effects are usually temporary and may include: Gas. Bloating. Cramping. Serious side effects are rare. Follow these instructions at home:  If you are taking probiotics with antibiotics: Wait at least 2 hours between taking your medicine and the probiotic. Eat foods high in fiber, such as whole grains, beans, and vegetables. These foods can help good bacteria grow. Avoid certain foods as told by your health care provider. Summary Probiotics are the good bacteria and yeasts that live in your body and keep you and your digestive system healthy. Certain foods, such as yogurt, contain probiotics. Probiotics can be taken as supplements. They can be bought at a pharmacy, health food store, or grocery store. They come in many different forms, strain combinations, and strengths. Be sure to talk with your health care provider before taking a probiotic supplement. This information is not intended to replace advice given to you by your health care provider. Make sure you discuss any questions you have with your health care provider. Document Revised: 06/25/2018 Document Reviewed: 10/21/2017 Elsevier Patient Education  2022 [employer].  The patient verbalized understanding of instructions, educational materials, and care plan provided today and agreed to receive a mailed copy of patient instructions, educational materials, and care plan Telephone follow up appointment with care management team member scheduled for:  Wednesday October 09, 2021 at 3:30 pm The patient has been provided with contact information for the care management team and has been advised to call with any health related questions or concerns  [name], RN, BSN, [place] [phone]: direct office [phone]: mobile

## 2021-08-16 NOTE — Chronic Care Management (AMB) (Signed)
Chronic Care Management   CCM RN Visit Note  08/16/2021 Name: [name] MRN: [id number] DOB: 1941-05-16  Subjective: [name] is a 80 y.o. year old female who is a primary care patient of Sagardia, Ines Bloomer, MD. The care management team was consulted for assistance with disease management and care coordination needs.    Engaged with patient by telephone for follow up visit in response to provider referral for case management and/or care coordination services.   Consent to Services:  The patient was given information about Chronic Care Management services, agreed to services, and gave verbal consent prior to initiation of services.  Please see initial visit note for detailed documentation.  Patient agreed to services and verbal consent obtained.   Assessment: Review of patient past medical history, allergies, medications, health status, including review of consultants reports, laboratory and other test data, was performed as part of comprehensive evaluation and provision of chronic care management services.  CCM Care Plan  Allergies  Allergen Reactions   Metformin Diarrhea   Outpatient Encounter Medications as of 08/16/2021  Medication Sig   Accu-Chek Softclix Lancets lancets 1 each by Other route 3 (three) times daily. as directed   allopurinol (ZYLOPRIM) 300 MG tablet TAKE 1 TABLET(300 MG) BY MOUTH DAILY   amiodarone (PACERONE) 200 MG tablet Take 1 tablet (200 mg total) by mouth daily.   amLODipine (NORVASC) 5 MG tablet Take 1 tablet (5 mg total) by mouth daily.   amoxicillin-clavulanate (AUGMENTIN) 500-125 MG tablet Take 1 tablet (500 mg total) by mouth 2 (two) times daily.   blood glucose meter kit and supplies Dispense based on patient and insurance preference. Use up to four times daily as directed. (FOR ICD-10 E10.9, E11.9). (Patient taking differently: 1 each by Other route See admin instructions. Dispense based on patient and insurance preference. Use up to four  times daily as directed. (FOR ICD-10 E10.9, E11.9).)   cloNIDine (CATAPRES) 0.3 MG tablet Take 1 tablet (0.3 mg total) by mouth 2 (two) times daily.   Continuous Blood Gluc Receiver (FREESTYLE LIBRE 14 DAY READER) DEVI Use as directed.   Continuous Blood Gluc Sensor (FREESTYLE LIBRE 14 DAY SENSOR) MISC Use as directed to check blood sugar daily   cyclobenzaprine (FLEXERIL) 5 MG tablet Take 1 tablet (5 mg total) by mouth as needed for muscle spasms.   diltiazem (CARDIZEM) 30 MG tablet Take 1 tablet (30 mg total) by mouth See admin instructions. Take one tablet every 4 hours as needed for heart rate greater than 100 and blood pressure needs to be above 100   ELIQUIS 5 MG TABS tablet TAKE 1 TABLET(5 MG) BY MOUTH TWICE DAILY   Insulin Lispro Prot & Lispro (HUMALOG MIX 75/25 KWIKPEN) (75-25) 100 UNIT/ML Kwikpen Inject 17 Units into the skin 2 (two) times daily.   levothyroxine (SYNTHROID) 75 MCG tablet Take 1 tablet (75 mcg total) by mouth daily before breakfast.   lidocaine (LIDODERM) 5 % Place 1 patch onto the skin daily. Remove & Discard patch within 12 hours or as directed by MD (Patient taking differently: Place 1 patch onto the skin daily as needed (Pain). Remove & Discard patch within 12 hours or as directed by MD)   loperamide (IMODIUM) 2 MG capsule Take 1 capsule (2 mg total) by mouth 4 (four) times daily as needed for diarrhea or loose stools.   losartan (COZAAR) 100 MG tablet TAKE 1 TABLET(100 MG) BY MOUTH DAILY   Na Sulfate-K Sulfate-Mg Sulf (SUPREP BOWEL PREP KIT) 17.5-3.13-1.6 GM/177ML  SOLN Take 1 kit by mouth as directed.   NOVOFINE PLUS PEN NEEDLE 32G X 4 MM MISC USE TWICE DAILY AS DIRECTED (Patient taking differently: 1 each by Other route as directed.)   Olopatadine HCl 0.2 % SOLN Place 1 drop into the right eye daily.   ONETOUCH VERIO test strip 1 each by Other route 3 (three) times daily.   potassium chloride (KLOR-CON) 10 MEQ tablet Take 2 tablets (20 mEq total) by mouth daily.    RESTASIS MULTIDOSE 0.05 % ophthalmic emulsion Place 1 drop into both eyes 2 (two) times daily.    rosuvastatin (CRESTOR) 10 MG tablet TAKE 1 TABLET BY MOUTH IN  THE EVENING (Patient taking differently: Take 10 mg by mouth every evening.)   Semaglutide, 1 MG/DOSE, (OZEMPIC, 1 MG/DOSE,) 4 MG/3ML SOPN Inject 1 mg into the skin once a week.   torsemide (DEMADEX) 20 MG tablet Take 1 tablet (20 mg total) by mouth daily.   triamcinolone cream (KENALOG) 0.1 % Apply 1 application topically 2 (two) times daily. (Patient taking differently: Apply 1 application topically daily as needed (Rash under breast).)   verapamil (CALAN) 120 MG tablet Take 1 tablet (120 mg total) by mouth in the morning.   No facility-administered encounter medications on file as of 08/16/2021.   Patient Active Problem List   Diagnosis Date Noted   Bruising at injection site 08/03/2021   Abdominal pain 08/03/2021   CAP (community acquired pneumonia) 07/17/2021   Chills 07/17/2021   Visual hallucination 07/17/2021   Dysthymia 03/12/2021   Dysuria 03/01/2021   Primary osteoarthritis involving multiple joints 03/01/2021   Recurrent falls 01/10/2021   Sensorineural hearing loss (SNHL), 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   Persistent atrial fibrillation ([place]) 08/12/2020   Type 2 diabetes mellitus with hyperglycemia, with long-term current use of insulin ([place]) 06/21/2020   Type 2 diabetes mellitus with diabetic polyneuropathy, with long-term current use of insulin ([place]) 06/21/2020   Mixed hyperlipidemia 06/21/2020   Uncontrolled hypertension 06/11/2020   Aortic atherosclerosis ([place]) 03/28/2020   Diabetic peripheral neuropathy ([place]) 12/26/2019   Gastroesophageal reflux disease without esophagitis 03/17/2019   Bilateral leg edema 02/16/2019   Pruritus 02/16/2019   Varicose veins of both legs with edema 02/16/2019   Heart murmur 09/05/2018   Restrictive lung disease 02/22/2018   Dysgeusia 06/11/2017   Osteoarthritis of left knee 06/03/2017    NAFLD (nonalcoholic fatty liver disease) 08/07/2016   Osteoporosis 12/31/2015   Obesity 12/19/2015   Hypersomnia 12/19/2015   Edema 10/18/2015   Acquired hypothyroidism 32/95/1884   Acute diastolic congestive heart failure, NYHA class 3 ([place]) 10/03/2013   Prolonged QT interval 16/60/6301   Acute diastolic heart failure ([place]) 10/03/2013   Long term (current) use of anticoagulants 06/09/2012   BENIGN NEOPLASM OF ADRENAL GLAND 11/25/2010   Chronic diastolic heart failure ([place]) 02/20/2010   DM 05/16/2009   GOUT 05/16/2009   OBESITY, MORBID 05/16/2009   Hypertension associated with diabetes ([place]) 05/16/2009   Paroxysmal atrial fibrillation ([place]) 05/16/2009   HYPERLIPIDEMIA 11/30/2008   Obstructive sleep apnea on CPAP 11/30/2008   Conditions to be addressed/monitored:  CHF and DMII  Care Plan : Heart Failure (Adult)  Updates made by Knox Royalty, RN since 08/16/2021 12:00 AM     Problem: Symptom Exacerbation (Heart Failure)   Priority: Medium     Long-Range Goal: Symptom Exacerbation Prevented or Minimized   Start Date: 03/25/2021  Expected End Date: 03/25/2022  This Visit's Progress: On track  Recent Progress: On track  Priority: Medium  Note:   Current Barriers:  Knowledge deficit related to basic heart failure pathophysiology and self care management- has general baseline knowledge; will require ongoing reinforcement of same Knowledge Deficits related to heart failure medications- needs thorough review/ optimization of medications: CCM Pharmacy referral placed 08/16/21: [place] team active Does not adhere to provider recommendations re: daily weight monitoring/ recording: agreeable to resume this practice: 08/16/21-- reports has been more adherent with daily weight monitoring Case Manager Clinical Goal(s):  Over the next 12 months, patient will complete the following as evidenced by patient reporting during CCM RN CM outreach of: verbalize understanding of Heart Failure  Action Plan and when to call doctor take all Heart Failure mediations as prescribed monitor/ record daily weights at home (notifying MD of 3 lb weight gain over night or 5 lb in a week) Interventions:  Collaboration with Horald Pollen, MD regarding development and update of comprehensive plan of care as evidenced by provider attestation and co-signature Inter-disciplinary care team collaboration (see longitudinal plan of care) Chart reviewed including relevant office notes, upcoming scheduled appointments, and lab results Discussed current clinical condition- she reports multiple recent sick episodes; states finally getting slowly better; denies acute concerns during our conversation-- states she has been contacting her doctors for the problems she has been experiencing; states she is "beginning to feel a little better;" denies issues around breathing status today, sounds to be in no obvious distress throughout phone call today  Reviewed with patient recent provider appointments-- she reports was placed on antibiotics at time of pulmonary provider office visit earlier this week; stated she experienced "terrible" GI pain and has recently been placed on different antibiotic; discussed with patient mechanism of action for all antibiotics and explained that GI upset is a very common side effect from taking antibiotics; provided education around option of taking OTC probiotics along with antibiotic therapy-- states she will consider Confirmed that patient is being followed by [facility] RN CM/ HF program and is now monitoring daily weights: discussed [facility] RN CM call this week to cardiology provider regarding significant weight gain over last week-- confirmed that patient was contacted by cardiology provider and is able to verbalize accurate instructions to increase diuretic/ take extra dose, along with potassium; patient verbalizes good understanding of same and states she will take extra fluid pill "soon;"  states she may wait until tomorrow due to abdominal pain she has been experiencing today from antibiotics  Reviewed recent weights at home: reports general weights between 245-250 lbs; reports weight this morning of "245 lbs" Confirmed patient has resumed using CPAP after recent dental issues Confirmed patient continues to follow low sodium heart healthy diet Reinforced previously provided education around signs/ symptoms yellow CHF zone, along with corresponding action plan: she verbalizes good understanding of same and will benefit from ongoing reinforcement and support of CHF self-health management Reviewed upcoming provider appointments with patient and confirmed that patient has plans to attend all as scheduled: 08/19/21: [place] team; 09/25/21: endocrinology; podiatry 10/08/21 Self-Care Activities: Patient verbalizes understanding of plan to resume daily weight monitoring and recording Self administers medications as prescribed-- occasionally reduces diuretic dose when she has to leave home: reports excessive urination with currently prescribed diuretic to the point that she is unable to leave home Attends all scheduled provider appointments Calls pharmacy for medication refills Attends church or other social activities Performs ADL's/ iADL's independently Calls provider office for new concerns or questions Patient Goals: [name] job weighing daily and writing it down on paper  Call cardiology office if I gain more than 2 pounds in one day or 5 pounds in one week: Great job contacting your cardiologist when you experience weight gain at home Do ankle pumps throughout the day when sitting Keep legs up while sitting Keep up following a "No Added Salt diet" and continue watching the food labels for salt content with the foods you eat and prepare  Watch for swelling in feet, ankles and legs every day Wear the compression stockings that the cardiology staff recommended on days that you notice  swelling in your feet/ ankles Follow Up Plan:  Telephone follow up appointment with care management team member scheduled for: Wednesday October 09, 2021 at 3:30 pm The patient has been provided with contact information for the care management team and has been advised to call with any health related questions or concerns.      Care Plan : Diabetes Type 2 (Adult)  Updates made by Knox Royalty, RN since 08/16/2021 12:00 AM     Problem: Glycemic Management (Diabetes, Type 2)   Priority: Medium     Long-Range Goal: Glycemic Management Optimized   Start Date: 03/25/2021  Expected End Date: 03/25/2022  This Visit's Progress: On track  Recent Progress: On track  Priority: Medium  Note:   Objective:  Lab Results  Component Value Date   HGBA1C 7.4 (A) 11/29/2020   Lab Results  Component Value Date   CREATININE 0.74 03/12/2021   CREATININE 0.80 01/23/2021   CREATININE 0.72 01/03/2021   Lab Results  Component Value Date   EGFR 83 03/12/2021   Current Barriers:  Knowledge Deficits related to basic Diabetes pathophysiology and self care/management- has fair baseline understanding, could benefit from ongoing reinforcement of same Does not know how to utilize history function of CGM- will require ongoing reinforcement of same  Case Manager Clinical Goal(s):  Over the next 12 months, patient will demonstrate improved adherence to prescribed treatment plan for diabetes self care/management as evidenced by patient reporting during CCM RN CM outreach of:   daily monitoring and recording of blood sugars at home using CGM and glucometer, as indicated   adherence to ADA/ carb modified/ low sugar diet  adherence to prescribed medication regimen Interventions:  Collaboration with Horald Pollen, MD regarding development and update of comprehensive plan of care as evidenced by provider attestation and co-signature Inter-disciplinary care team collaboration (see longitudinal plan of  care) Review of patient status, including review of consultants reports, relevant laboratory and other test results, and medications completed Chart reviewed including relevant office notes, upcoming scheduled appointments, and lab results: last A1-C noted: 7.8 (05/24/21)-- increased from 7.4 (11/29/20) Reviewed with patient personal historical A1-C trends over time: reiterated previously provided education around correlation of average blood sugars at home over 3 months with A1-C values   Confirmed that patient has resumed taking weekly Ozempic; reports that she has not increased Humalog insulin to 17 Units as recommended at time of 05/24/21 endocrinology appointment-- she reports that when she took 17 units, it caused her blood sugars to "fall very quickly," and "made me feel so sick;" stated this happened several times; confirms that she has resumed taking 15 units; reports she is holding morning insulin "for blood sugars that are less than 130;" states the Humalog insulin makes her blood sugars "drop too fast" if she takes it for blood sugars < 130 Confirmed continues "trying" to follow carb-modified, low sugar diet; confirms previously reported complications around recent oral surgery "finally somewhat  better" Reviewed recent blood sugars at home-- fasting reported at "118-130;" she does not specifically tell me what her post-prandial values have been  Discussed plans with patient for ongoing care management follow up and provided patient with direct contact information for care management team- reminded patient of CCM pharmacy call scheduled for 08/19/21- she verbalizes understanding Self-Care Activities UNABLE to independently check historical values using CGM/ FSL: will require ongoing reinforcement Self administers insulin and injectable DM medicationas (Ozempic) prescribed Attends all scheduled provider appointments Checks blood sugars as prescribed and compares CGM to glucometer values when  indicated Attempts to adhere to prescribed ADA/carb modified/ low sugar diet: could benefit from ongoing reinforcement of dietary management of DM Patient Goals: Keep up the great work monitoring and writing down your blood sugars at home: first thing in the morning before eating and then again 2 hours after a meal: this will help your doctors know if your medications are working or if they need adjusting: we will review these values each time we talk over the phone  Check blood sugar if I feel it is too high or too low; if you experience ongoing low blood sugars less than 70-- please let Dr. Quin Hoop office know Take your blood sugars from home to all doctor visits  Continue keeping a close eye on your feet and contact your diabetes doctor or your primary care doctor if you develop concerns about the appearance of your feet Try to stay as active as possible Keep trying to follow a low carbohydrate and low sugar diet Continues taking your insulin and your Ozempic as it is prescribed Follow Up Plan:  Telephone follow up appointment with care management team member scheduled for: Wednesday October 09, 2021 at 3:30 pm The patient has been provided with contact information for the care management team and has been advised to call with any health related questions or concerns.      Plan: Telephone follow up appointment with care management team member scheduled for:  Wednesday October 09, 2021 at 3:30 pm The patient has been provided with contact information for the care management team and has been advised to call with any health related questions or concerns   [name], RN, BSN, [place] [phone]: direct office [phone]: mobile

## 2021-08-16 NOTE — Telephone Encounter (Signed)
Weight gain:  10/21  239 lbs 10/28  245.5 lbs Feeling of fullness in Abdomen. Shortness of breath related to not being able to take a deep breath per patient. Taking Torsemide 20 mg daily. Of note Chest Xray showed pneumonia on 08/12/21. Pulmonology treating pneumonia.   Dr. Harrington Challenger (DOD) to have patient take extra dose of torsemide 20 mg and potassium 20 meq today.   Notified patient of recommendation. Verbalized understanding and agreement. Will forward to Dr. Caryl Comes and [facility].

## 2021-08-16 NOTE — Telephone Encounter (Signed)
Pt c/o swelling: STAT is pt has developed SOB within 24 hours  If swelling, where is the swelling located? Abdomen   How much weight have you gained and in what time span? 5lbs since Monday  Have you gained 3 pounds in a day or 5 pounds in a week? Gained 5lbs since Monday  Do you have a log of your daily weights (if so, list)? unsure  Are you currently taking a fluid pill?   Are you currently SOB? unsure  Have you traveled recently?    Megan from [employer] called stating patient is in there Heart Failure program, she states patient has gained 5lbs since Monday. She is having bloating in her abdomen.  If you can please call the patient directly.    Megan's contact information if needed is [phone] ex. [phone].

## 2021-08-16 NOTE — Telephone Encounter (Signed)
There is a interaction between zpak and Edgar.  Dr. Annamaria Boots, please advise. thanks

## 2021-08-19 ENCOUNTER — Ambulatory Visit: Payer: Medicare Other

## 2021-08-19 ENCOUNTER — Other Ambulatory Visit: Payer: Self-pay

## 2021-08-19 DIAGNOSIS — E1142 Type 2 diabetes mellitus with diabetic polyneuropathy: Secondary | ICD-10-CM

## 2021-08-19 DIAGNOSIS — I5032 Chronic diastolic (congestive) heart failure: Secondary | ICD-10-CM

## 2021-08-19 DIAGNOSIS — Z794 Long term (current) use of insulin: Secondary | ICD-10-CM

## 2021-08-19 NOTE — Patient Instructions (Signed)
Visit Information  The patient verbalized understanding of instructions, educational materials, and care plan provided today and declined offer to receive copy of patient instructions, educational materials, and care plan.   Telephone follow up appointment with care management team member scheduled for: 3 months The patient has been provided with contact information for the care management team and has been advised to call with any health related questions or concerns.   Berkleigh Beckles C Morgan Keinath, PharmD Clinical Pharmacist, [place]      

## 2021-08-19 NOTE — Progress Notes (Signed)
Chronic Care Management Pharmacy Note  08/19/2021 Name:  [name] MRN:  [id number] DOB:  08/26/1941  Summary: -Patient reports that she is currently recovering from pneumonia - states that she believes she is allergic to levofloxacin as it made her feel very unwell - currently taking Augmentin without issue - will finish course on Thursday -Patient reports that she has restarted ozempic since last visit, has been using 1mg  weekly for past 2 months, reports to average blood sugar of no more than 140 -Patient continues to check BP  - averaging 140/60-70's - has been higher recently due to increase back pain/ abdominal pain - patient reports that she has restarted amlodipine - unsure as to why - could not find in notes that it was meant to be restarted   Recommendations/Changes made from today's visit: - Recommending for patient to discontinue amlodipine (patient taking verapamil - had to be reduced due to swelling in legs) patient to continue to monitor blood sugars and blood pressures daily and reach out to office should BG average >150 or if BP averages >140/90 -Patient reports to constipation on occasion, recommended for patient to trial use of miralax/ fiber supplement to help maintain regular bowel movements if needed   Subjective: [name] is an 80 y.o. year old female who is a primary patient of Sagardia, Eilleen Kempf, MD.  The CCM team was consulted for assistance with disease management and care coordination needs.    Engaged with patient by telephone for follow-up visit in response to provider referral for pharmacy case management and/or care coordination services.   Consent to Services:  The patient was given the following information about Chronic Care Management services today, agreed to services, and gave verbal consent: 1. CCM service includes personalized support from designated clinical staff supervised by the primary care provider, including individualized plan of  care and coordination with other care providers 2. 24/7 contact phone numbers for assistance for urgent and routine care needs. 3. Service will only be billed when office clinical staff spend 20 minutes or more in a month to coordinate care. 4. Only one practitioner may furnish and bill the service in a calendar month. 5.The patient may stop CCM services at any time (effective at the end of the month) by phone call to the office staff. 6. The patient will be responsible for cost sharing (co-pay) of up to 20% of the service fee (after annual deductible is met). Patient agreed to services and consent obtained.  Patient Care Team: Georgina Quint, MD as PCP - General (Internal Medicine) Duke Salvia, MD as PCP - Electrophysiology (Cardiology) Sherren Mocha, MD as Resident (Family Medicine) [name] Corner, RN as Case Manager Bertin Inabinet, Vinnie Level, [facility] as Pharmacist (Pharmacist)  Recent office visits: 03/11/2021 - PCP visit - BP and diabetic check / dysuria - given 7 day course of Augmentin  02/28/2021 - PCP visit - dysuria - prescribed cefuroxime x 7 days  01/23/2021 - PCP visit - HTN, DM - no changes to medications   Recent consult visits: 07/30/2021 - Dr. Jonny Ruiz - bruising at injection site - patient to monitor site for healing, patient can try colace for constipation  07/24/2021 - Dr. Alvy Bimler - f/u for pneumonia - no changes at appointment  07/17/2021 - Dr. Jonny Ruiz - evaluation of 3 days worsening chills/ feeling ill - suspected worsening of CAP - 1g rocephin given, patient to continue levaquin outpatient  07/05/2021 - Dr Eloy End - podiatry - toenails debrided  07/04/2021 -  Rubye Oaks NP - Pulmonology - f/u for OSA, encouraged to restart use of CPAP mask 05/30/2021 - Rudi Coco NP - cardiology - no medication changes at this time  05/24/2021 - Dr. Lonzo Cloud- Endocrinology - has not yet restarted ozempic for unknown reason - humalog mix increased   Hospital visits: 05/08/2021 - ED visit due to  hemorrhage of tooth socket  - txa administered - teabags placed, surgical foam placed - bleeding was slowed but had continued - discharged at her request - following up with oral surgeon   Objective:  Lab Results  Component Value Date   CREATININE 0.61 07/17/2021   BUN 9 07/17/2021   GFR 86.02 07/17/2021   GFRNONAA 78 11/29/2020   GFRAA 90 11/29/2020   NA 139 07/17/2021   K 3.5 07/17/2021   CALCIUM 9.1 07/17/2021   CO2 31 07/17/2021   GLUCOSE 117 (H) 07/17/2021    Lab Results  Component Value Date/Time   HGBA1C 7.8 (A) 05/24/2021 10:04 AM   HGBA1C 7.4 (A) 11/29/2020 10:33 AM   HGBA1C 7.7 (H) 09/12/2020 11:59 AM   HGBA1C 9.5 (H) 10/03/2013 07:05 PM   GFR 86.02 07/17/2021 11:27 AM   GFR 71.14 01/23/2021 03:40 PM   MICROALBUR 6.3 (H) 06/21/2020 10:53 AM   MICROALBUR 2.48 (H) 08/21/2009 09:13 PM    Last diabetic Eye exam:  No results found for: HMDIABEYEEXA  Last diabetic Foot exam:  No results found for: HMDIABFOOTEX   Lab Results  Component Value Date   CHOL 135 09/12/2020   HDL 62 09/12/2020   LDLCALC 57 09/12/2020   TRIG 84 09/12/2020   CHOLHDL 2.2 09/12/2020    Hepatic Function Latest Ref Rng & Units 07/17/2021 03/12/2021 01/23/2021  Total Protein 6.0 - 8.3 g/dL 7.8 7.0 7.3  Albumin 3.5 - 5.2 g/dL 3.9 4.2 3.9  AST 0 - 37 U/L 18 20 16   ALT 0 - 35 U/L 15 17 13   Alk Phosphatase 39 - 117 U/L 100 125(H) 110  Total Bilirubin 0.2 - 1.2 mg/dL 0.5 0.4 0.5  Bilirubin, Direct 0.0 - 0.3 mg/dL 0.1 - -    Lab Results  Component Value Date/Time   TSH 3.350 03/12/2021 11:52 AM   TSH 2.050 07/05/2020 10:07 AM   FREET4 1.43 02/21/2020 08:46 AM   FREET4 1.24 (H) 05/22/2016 09:45 AM    CBC Latest Ref Rng & Units 07/17/2021 05/08/2021 03/12/2021  WBC 4.0 - 10.5 K/uL 11.2(H) 12.8(H) 12.9(H)  Hemoglobin 12.0 - 15.0 g/dL 11.7(L) 11.4(L) 12.0  Hematocrit 36.0 - 46.0 % 37.1 37.5 38.2  Platelets 150.0 - 400.0 K/uL 352.0 324 357    Lab Results  Component Value Date/Time   VD25OH  21.09 (L) 01/25/2021 10:16 AM   VD25OH 23.37 (L) 01/23/2021 03:40 PM    Clinical ASCVD: Yes  The 10-year ASCVD risk score (Arnett DK, et al., 2019) is: 36.7%   Values used to calculate the score:     Age: 57 years     Sex: Female     Is Non-Hispanic African American: No     Diabetic: Yes     Tobacco smoker: No     Systolic Blood Pressure: 114 mmHg     Is BP treated: Yes     HDL Cholesterol: 62 mg/dL     Total Cholesterol: 135 mg/dL    Depression screen [facility] 2/9 07/24/2021 03/25/2021 01/23/2021  Decreased Interest 0 0 0  Down, Depressed, Hopeless 0 0 0  PHQ - 2 Score 0 0 0  Some recent data might be hidden    Social History   Tobacco Use  Smoking Status Never  Smokeless Tobacco Never   BP Readings from Last 3 Encounters:  08/12/21 114/70  07/30/21 128/70  07/24/21 130/70   Pulse Readings from Last 3 Encounters:  08/12/21 70  07/30/21 69  07/24/21 78   Wt Readings from Last 3 Encounters:  08/12/21 244 lb 3.2 oz (110.8 kg)  07/30/21 247 lb (112 kg)  07/24/21 247 lb (112 kg)   BMI Readings from Last 3 Encounters:  08/12/21 47.69 kg/m  07/30/21 48.24 kg/m  07/24/21 46.67 kg/m    Assessment/Interventions: Review of patient past medical history, allergies, medications, health status, including review of consultants reports, laboratory and other test data, was performed as part of comprehensive evaluation and provision of chronic care management services.   SDOH:  (Social Determinants of Health) assessments and interventions performed: Yes  SDOH Screenings   Alcohol Screen: Low Risk    Last Alcohol Screening Score (AUDIT): 0  Depression (PHQ2-9): Low Risk    PHQ-2 Score: 0  Financial Resource Strain: Not on file  Food Insecurity: No Food Insecurity   Worried About Programme researcher, broadcasting/film/video in the Last Year: Never true   Ran Out of Food in the Last Year: Never true  Housing: Low Risk    Last Housing Risk Score: 0  Physical Activity: Not on file  Social Connections:  Not on file  Stress: Not on file  Tobacco Use: Low Risk    Smoking Tobacco Use: Never   Smokeless Tobacco Use: Never   Passive Exposure: Not on file  Transportation Needs: No Transportation Needs   Lack of Transportation (Medical): No   Lack of Transportation (Non-Medical): No    CCM Care Plan  Allergies  Allergen Reactions   Doxycycline     Made patient feel very ill    Metformin Diarrhea    Medications Reviewed Today     Reviewed by Ellin Saba, [facility] (Pharmacist) on 08/19/21 at 1614  Med List Status: <None>   Medication Order Taking? Sig Documenting Provider Last Dose Status Informant  Accu-Chek Softclix Lancets lancets [id number]  1 each by Other route 3 (three) times daily. as directed Janeece Agee, NP  Active Self           Med Note Vernard Gambles   Tue Mar 12, 2021 11:12 AM)    allopurinol (ZYLOPRIM) 300 MG tablet [id number]  TAKE 1 TABLET(300 MG) BY MOUTH DAILY Georgina Quint, MD  Active   amiodarone (PACERONE) 200 MG tablet [id number]  Take 1 tablet (200 mg total) by mouth daily. Duke Salvia, MD  Active Self  amLODipine (NORVASC) 5 MG tablet [id number] Yes Take 5 mg by mouth daily. [provider] Taking Active   amoxicillin-clavulanate (AUGMENTIN) 500-125 MG tablet [id number]  Take 1 tablet (500 mg total) by mouth 2 (two) times daily. Jetty Duhamel D, MD  Active   blood glucose meter kit and supplies [id number]  Dispense based on patient and insurance preference. Use up to four times daily as directed. (FOR ICD-10 E10.9, E11.9).  Patient taking differently: 1 each by Other route See admin instructions. Dispense based on patient and insurance preference. Use up to four times daily as directed. (FOR ICD-10 E10.9, E11.9).   Georgina Quint, MD  Active            Med Note Wyvonnia Dusky, Patric Dykes May 08, 2021 10:51 AM)  cloNIDine (CATAPRES) 0.3 MG tablet [id number]  Take 1 tablet (0.3 mg total) by mouth 2 (two) times daily. Georgina Quint, MD  Active   Continuous Blood Gluc Receiver (FREESTYLE LIBRE 14 DAY READER) [place] [id number]  Use as directed. Georgina Quint, MD  Active Self           Med Note Ansel Bong May 08, 2021 10:51 AM)    Continuous Blood Gluc Sensor (FREESTYLE LIBRE 14 DAY SENSOR) [place] [id number]  Use as directed to check blood sugar daily [name], Konrad Dolores, MD  Active Self           Med Note Hale Bogus   Wed May 08, 2021 10:51 AM)    cyclobenzaprine (FLEXERIL) 5 MG tablet [id number]  Take 1 tablet (5 mg total) by mouth as needed for muscle spasms. Georgina Quint, MD  Active   diltiazem (CARDIZEM) 30 MG tablet [id number]  Take 1 tablet (30 mg total) by mouth See admin instructions. Take one tablet every 4 hours as needed for heart rate greater than 100 and blood pressure needs to be above 100 Newman Nip, NP  Active   ELIQUIS 5 MG TABS tablet [id number]  TAKE 1 TABLET(5 MG) BY MOUTH TWICE DAILY Duke Salvia, MD  Active   Insulin Lispro Prot & Lispro (HUMALOG MIX 75/25 KWIKPEN) (75-25) 100 UNIT/ML Stephanie Coup [id number]  Inject 17 Units into the skin 2 (two) times daily. [name], Konrad Dolores, MD  Active Self  levothyroxine (SYNTHROID) 75 MCG tablet [id number]  Take 1 tablet (75 mcg total) by mouth daily before breakfast. Georgina Quint, MD  Expired 06/07/21 2359 Self  lidocaine (LIDODERM) 5 % [id number]  Place 1 patch onto the skin daily. Remove & Discard patch within 12 hours or as directed by MD  Patient taking differently: Place 1 patch onto the skin daily as needed (Pain). Remove & Discard patch within 12 hours or as directed by MD   Kathryne Hitch, MD  Active            Med Note Wyvonnia Dusky, Emi Belfast   Wed May 08, 2021 10:52 AM)    loperamide (IMODIUM) 2 MG capsule [id number]  Take 1 capsule (2 mg total) by mouth 4 (four) times daily as needed for diarrhea or loose stools. Mare Ferrari, PA-C  Active Self           Med Note Phebe Colla Jul 09, 2020 11:11 AM)    losartan (COZAAR) 100 MG tablet [id number]  TAKE 1 TABLET(100 MG) BY MOUTH DAILY Georgina Quint, MD  Active   Na Sulfate-K Sulfate-Mg Sulf (SUPREP BOWEL PREP KIT) 17.5-3.13-1.6 GM/177ML SOLN [id number]  Take 1 kit by mouth as directed. Rachael Fee, MD  Active   NOVOFINE PLUS PEN NEEDLE 32G X 4 MM MISC [id number]  USE TWICE DAILY AS DIRECTED  Patient taking differently: 1 each by Other route as directed.   Georgina Quint, MD  Active   Olopatadine HCl 0.2 % SOLN [id number]  Place 1 drop into the right eye daily. [provider]  Active Self  ONETOUCH VERIO test strip [id number]  1 each by Other route 3 (three) times daily. [provider]  Active Self           Med Note Wyvonnia Dusky, Emi Belfast   Wed May 08, 2021 10:53 AM)    potassium chloride (KLOR-CON) 10 MEQ tablet [id number]  Take  2 tablets (20 mEq total) by mouth daily. Gypsy Balsam K, NP  Active Self  RESTASIS MULTIDOSE 0.05 % ophthalmic emulsion [id number]  Place 1 drop into both eyes 2 (two) times daily.  [provider]  Active Self  rosuvastatin (CRESTOR) 10 MG tablet [id number]  TAKE 1 TABLET BY MOUTH IN  THE EVENING  Patient taking differently: Take 10 mg by mouth every evening.   Georgina Quint, MD  Active   Semaglutide, 1 MG/DOSE, (OZEMPIC, 1 MG/DOSE,) 4 MG/3ML SOPN [id number]  Inject 1 mg into the skin once a week. [name], Konrad Dolores, MD  Active Self  torsemide (DEMADEX) 20 MG tablet [id number]  Take 1 tablet (20 mg total) by mouth daily. Newman Nip, NP  Active Self  triamcinolone cream (KENALOG) 0.1 % [id number]  Apply 1 application topically 2 (two) times daily.  Patient taking differently: Apply 1 application topically daily as needed (Rash under breast).   Georgina Quint, MD  Active            Med Note Richardson Dopp, [place] S   Tue Mar 12, 2021 11:12 AM)    verapamil (CALAN) 120 MG tablet [id number]  Take 1 tablet (120 mg total) by  mouth in the morning. Duke Salvia, MD  Active             Patient Active Problem List   Diagnosis Date Noted   Bruising at injection site 08/03/2021   Abdominal pain 08/03/2021   CAP (community acquired pneumonia) 07/17/2021   Chills 07/17/2021   Visual hallucination 07/17/2021   Dysthymia 03/12/2021   Dysuria 03/01/2021   Primary osteoarthritis involving multiple joints 03/01/2021   Recurrent falls 01/10/2021   Sensorineural hearing loss (SNHL), 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   Persistent atrial fibrillation (HCC) 08/12/2020   Type 2 diabetes mellitus with hyperglycemia, with long-term current use of insulin (HCC) 06/21/2020   Type 2 diabetes mellitus with diabetic polyneuropathy, with long-term current use of insulin (HCC) 06/21/2020   Mixed hyperlipidemia 06/21/2020   Uncontrolled hypertension 06/11/2020   Aortic atherosclerosis (HCC) 03/28/2020   Diabetic peripheral neuropathy (HCC) 12/26/2019   Gastroesophageal reflux disease without esophagitis 03/17/2019   Bilateral leg edema 02/16/2019   Pruritus 02/16/2019   Varicose veins of both legs with edema 02/16/2019   Heart murmur 09/05/2018   Restrictive lung disease 02/22/2018   Dysgeusia 06/11/2017   Osteoarthritis of left knee 06/03/2017   NAFLD (nonalcoholic fatty liver disease) 75/64/3329   Osteoporosis 12/31/2015   Obesity 12/19/2015   Hypersomnia 12/19/2015   Edema 10/18/2015   Acquired hypothyroidism 10/18/2015   Acute diastolic congestive heart failure, NYHA class 3 (HCC) 10/03/2013   Prolonged QT interval 10/03/2013   Acute diastolic heart failure (HCC) 10/03/2013   Long term (current) use of anticoagulants 06/09/2012   BENIGN NEOPLASM OF ADRENAL GLAND 11/25/2010   Chronic diastolic heart failure (HCC) 02/20/2010   DM 05/16/2009   GOUT 05/16/2009   OBESITY, MORBID 05/16/2009   Hypertension associated with diabetes (HCC) 05/16/2009   Paroxysmal atrial fibrillation (HCC) 05/16/2009   HYPERLIPIDEMIA 11/30/2008    Obstructive sleep apnea on CPAP 11/30/2008    Immunization History  Administered Date(s) Administered   Fluad Quad(high Dose 65+) 12/31/2015   Influenza Split 09/20/2011, 07/13/2012   Influenza, High Dose Seasonal PF 08/07/2016, 08/04/2017, 07/21/2018, 07/22/2019   Influenza,inj,Quad PF,6+ Mos 10/04/2013   Influenza-Unspecified 09/20/2011, 07/13/2012, 10/04/2013, 08/07/2016   Pneumococcal Conjugate-13 05/11/2014   Pneumococcal Polysaccharide-23 07/13/2012   Pneumococcal-Unspecified 07/15/2012   Zoster, Live 07/29/2012  Conditions to be addressed/monitored:  Hypertension, Hyperlipidemia, Diabetes, Atrial Fibrillation, Heart Failure, Hypothyroidism, Osteoarthritis, Gout, and Dry Eyes  Care Plan : CCM Care Plan  Updates made by Ellin Saba, RPH since 08/19/2021 12:00 AM     Problem: HTN, HLD, HF, Afib, Chronic Pain of right shoulder, Dry Eye, Hypothyroidism, DM2   Priority: High  Onset Date: 05/20/2021     Long-Range Goal: Disease Management   Start Date: 05/20/2021  Expected End Date: 11/20/2021  This Visit's Progress: On track  Recent Progress: On track  Priority: High  Note:   Current Barriers:  Unable to independently monitor therapeutic efficacy Unable to achieve control of blood sugar/ A1c   Pharmacist Clinical Goal(s):  Patient will achieve adherence to monitoring guidelines and medication adherence to achieve therapeutic efficacy achieve control of A1c as evidenced by blood sugar logs maintain control of LDL, Afib / HF as evidenced by next lipid panel / blood pressure and heart rate logs  through collaboration with PharmD and provider.   Interventions: 1:1 collaboration with Georgina Quint, MD regarding development and update of comprehensive plan of care as evidenced by provider attestation and co-signature Inter-disciplinary care team collaboration (see longitudinal plan of care) Comprehensive medication review performed; medication list updated in  electronic medical record  Hyperlipidemia: (LDL goal < 70) -Controlled Lab Results  Component Value Date   LDLCALC 57 09/12/2020  -Current treatment: Rosuvastatin 10mg  daily  -Medications previously tried: n/a  -Current dietary patterns: reports to eating more fish and chicken, avoids red meats / fried or fatty foods -Current exercise habits: limited -Educated on Cholesterol goals;  Benefits of statin for ASCVD risk reduction; Importance of limiting foods high in cholesterol; -Counseled on diet and exercise extensively Recommended to continue current medication  Diabetes (A1c goal <7%) -Not ideally controlled - Lab Results  Component Value Date   HGBA1C 7.8 (A) 05/24/2021  -Current medications: Humalog mix 75/25 - 17 units twice daily  Ozempic 1mg  weekly -Medications previously tried: metformin, glimepiride  -Current home glucose readings fasting glucose: reports that blood sugars have been no higher than 140 recently since restarting ozempic -Denies hypoglycemic/hyperglycemic symptoms -Current meal patterns:  breakfast: eggs, oatmeal with coffee, on occasion may eat cereal  lunch: cottage cheese, pineapple, tunafish sandwich  dinner: fish, shrimp, chicken, + vegetable  snacks: apple, orange, plantains drinks: water, diet soda -Current exercise: limited at this time -Educated on A1c and blood sugar goals; Complications of diabetes including kidney damage, retinal damage, and cardiovascular disease; Exercise goal of 150 minutes per week; Prevention and management of hypoglycemic episodes; Benefits of routine self-monitoring of blood sugar; Continuous glucose monitoring; Carbohydrate counting and/or plate method -Counseled to check feet daily and get yearly eye exams -Counseled on diet and exercise extensively Recommended for patient to continue current medications  Atrial Fibrillation (Goal: prevent stroke and major bleeding) -Controlled -CHADSVASC: at least  5 -Current treatment: Rate control: Amiodarone 200mg  daily / diltiazem 30mg  - 1 tablet every 4 hours as needed (if in afib) Anticoagulation: Eliquis 5mg  twice daily  -Medications previously tried: metoprolol, dofetilide -Home BP and HR readings: ~140/60-70 with heart rate 65-77  -Counseled on increased risk of stroke due to Afib and benefits of anticoagulation for stroke prevention; importance of adherence to anticoagulant exactly as prescribed; bleeding risk associated with Eliquis and importance of self-monitoring for signs/symptoms of bleeding; avoidance of NSAIDs due to increased bleeding risk with anticoagulants; importance of regular laboratory monitoring; seeking medical attention after a head injury or if there is  blood in the urine/stool; -Recommended to continue current medication  Heart Failure (Goal: manage symptoms and prevent exacerbations)/Hypertension (BP goal <140/90) -Unsure of current level of control -Last ejection fraction: 60-65% (Date: 05/26/2016) -HF type: Diastolic -NYHA Class: III (marked limitation of activity) -Current treatment: Clonidine 0.3mg  - 1 tablet twice daily  Torsemide 20mg  - 2 tablets daily - patient taking 1 tablet daily due to excessive urination  Potassium Chloride - 2 tablets daily - has not been taking Last Potassium level (3.3 mmol/L - 03/12/2021) Losartan 100mg  - 1 tablet daily  Verapamil 120mg  - 1 tablet once daily - dose reduced due to swelling in legs - reduced at advised of Cardiology  -Medications previously tried: triamterene/hctz, spironolactone, metoprolol, lisinopril, isosorbide mononitrate, furosemide, eplerenone, amlodipine,   -Current home BP/HR readings: reports that BP averaged 140/60-70, HR 65-75 bpm -Current dietary habits: eats a sodium reduced diet, does not add any additional salt to her foods -Current exercise habits: limited -Educated on Benefits of medications for managing symptoms and prolonging life Importance of  weighing daily; if you gain more than 3 pounds in one day or 5 pounds in one week, make clinic aware Proper diuretic administration and potassium supplementation - patient agreeable to resume potassium supplementation  Importance of blood pressure control -Counseled on diet and exercise extensively Recommended to continue current medication  Hypothyroidism (Goal: Maintenance of euthyroid levels) -Controlled -Last TSH level 3.350uIU/mL (03/12/2021) -Current treatment  Levothyroxine - 1 tablet daily  -Medications previously tried: n/a  -Recommended to continue current medication  Gout (Goal: Prevention of gout attack / control of pain) -Controlled -Current treatment  Allopurinol 300mg  daily  -Medications previously tried: n/a  -Recommended to continue current medication  Dry Eye (Goal: prevention of irritation of eye) -Controlled -Current treatment  Restasis 0.05% - 1 drop into both eyes twice daily  Olopatadine 0.2% - 1 drop into right eye once daily  -Medications previously tried: ketorolac  -Recommended to continue current medication  Health Maintenance -Vaccine gaps: Influenza, COVID booster, and Shingles vaccines -Current therapy:  Loperamide 2mg  - 1 capsule 4 times daily as needed  Triamcinolone 0.1% cream - applied daily as needed  -Educated on Cost vs benefit of each product must be carefully weighed by individual consumer -Patient is satisfied with current therapy and denies issues -Recommended to continue current medication  Patient Goals/Self-Care Activities Patient will:  - take medications as prescribed check glucose at least once daily, document, and provide at future appointments check blood pressure once daily, document, and provide at future appointments weigh daily, and contact provider if weight gain of more than 3lbs in a day or more than 5 pounds in a week engage in dietary modifications by reducing sodium intake, moderation of carbohydrate / foods  high in cholesterol  Follow Up Plan: Telephone follow up appointment with care management team member scheduled for: 3 months The patient has been provided with contact information for the care management team and has been advised to call with any health related questions or concerns.       Medication Assistance: None required.  Patient affirms current coverage meets needs.  Patient's preferred pharmacy is:  [facility] [phone] [address] - [place], [place] - [address] [place] [address] Phone: [phone] Fax: [phone]  [facility]  [facility]) - [place], [place] - [address] [facility] [address] [place] [address] [place] [address] Phone: [phone] Fax: [phone]  [facility] [phone] -  [place], [place] - [address] AT [facility] OF [address]  [place] [address] Phone: [phone] Fax: [phone]  Uses pill box? Yes Pt endorses 80-90% compliance  Care Plan and Follow Up Patient Decision:  Patient agrees to Care Plan and Follow-up.  Plan: Telephone follow up appointment with care management team member scheduled for:  2 months and The patient has been provided with contact information for the care management team and has been advised to call with any health related questions or concerns.   Ellin Saba, PharmD Clinical Pharmacist, [facility]

## 2021-08-19 NOTE — Telephone Encounter (Signed)
Spoke with pt who reports weight this morning is 243.  Pt states no swelling in her feet and she feels about 60% better,  She will see pulmonology this Thursday for further evaluation of her pneumonia.  Pt advised to continue daily weights and contact office if needed.  Pt verbalizes understanding and thanked Therapist, sports for the call.

## 2021-08-23 ENCOUNTER — Ambulatory Visit (INDEPENDENT_AMBULATORY_CARE_PROVIDER_SITE_OTHER)
Admission: RE | Admit: 2021-08-23 | Discharge: 2021-08-23 | Disposition: A | Discharge status: Home / Self Care | Payer: Medicare Other | Source: Ambulatory Visit | Attending: Nurse Practitioner | Admitting: Nurse Practitioner

## 2021-08-23 ENCOUNTER — Encounter: Payer: Self-pay | Admitting: Nurse Practitioner

## 2021-08-23 ENCOUNTER — Other Ambulatory Visit: Payer: Self-pay

## 2021-08-23 ENCOUNTER — Ambulatory Visit (INDEPENDENT_AMBULATORY_CARE_PROVIDER_SITE_OTHER): Payer: Medicare Other | Admitting: Nurse Practitioner

## 2021-08-23 VITALS — BP 176/75 | HR 62 | Temp 97.5°F | Ht 61.0 in | Wt 246.1 lb

## 2021-08-23 DIAGNOSIS — R1011 Right upper quadrant pain: Secondary | ICD-10-CM

## 2021-08-23 DIAGNOSIS — R109 Unspecified abdominal pain: Secondary | ICD-10-CM | POA: Diagnosis not present

## 2021-08-23 LAB — CBC WITH DIFFERENTIAL/PLATELET
Basophils Absolute: 0 10*3/uL (ref 0.0–0.1)
Basophils Relative: 0.3 % (ref 0.0–3.0)
Eosinophils Absolute: 0.3 10*3/uL (ref 0.0–0.7)
Eosinophils Relative: 2.3 % (ref 0.0–5.0)
HCT: 36.5 % (ref 36.0–46.0)
Hemoglobin: 11.5 g/dL — ABNORMAL LOW (ref 12.0–15.0)
Lymphocytes Relative: 23.5 % (ref 12.0–46.0)
Lymphs Abs: 2.7 10*3/uL (ref 0.7–4.0)
MCHC: 31.4 g/dL (ref 30.0–36.0)
MCV: 80.8 fl (ref 78.0–100.0)
Monocytes Absolute: 1.2 10*3/uL — ABNORMAL HIGH (ref 0.1–1.0)
Monocytes Relative: 10.4 % (ref 3.0–12.0)
Neutro Abs: 7.2 10*3/uL (ref 1.4–7.7)
Neutrophils Relative %: 63.5 % (ref 43.0–77.0)
Platelets: 346 10*3/uL (ref 150.0–400.0)
RBC: 4.52 Mil/uL (ref 3.87–5.11)
RDW: 17.6 % — ABNORMAL HIGH (ref 11.5–15.5)
WBC: 11.3 10*3/uL — ABNORMAL HIGH (ref 4.0–10.5)

## 2021-08-23 LAB — COMPREHENSIVE METABOLIC PANEL
ALT: 20 U/L (ref 0–35)
AST: 23 U/L (ref 0–37)
Albumin: 4 g/dL (ref 3.5–5.2)
Alkaline Phosphatase: 126 U/L — ABNORMAL HIGH (ref 39–117)
BUN: 12 mg/dL (ref 6–23)
CO2: 35 mEq/L — ABNORMAL HIGH (ref 19–32)
Calcium: 9.1 mg/dL (ref 8.4–10.5)
Chloride: 98 mEq/L (ref 96–112)
Creatinine, Ser: 0.76 mg/dL (ref 0.40–1.20)
GFR: 75.34 mL/min (ref >60.00)
Glucose, Bld: 227 mg/dL — ABNORMAL HIGH (ref 70–99)
Potassium: 3.4 mEq/L — ABNORMAL LOW (ref 3.5–5.1)
Sodium: 140 mEq/L (ref 135–145)
Total Bilirubin: 0.6 mg/dL (ref 0.2–1.2)
Total Protein: 7.2 g/dL (ref 6.0–8.3)

## 2021-08-23 LAB — LIPASE: Lipase: 17 U/L (ref 11.0–59.0)

## 2021-08-23 NOTE — Progress Notes (Signed)
Acute Office Visit  Subjective:    Patient ID: [name], female    DOB: Sep 06, 1941, 80 y.o.   MRN: [id number]  Chief Complaint  Patient presents with   Abdominal Pain    X 5 days. Right upper abdomen area-gets pain and pressure/swelling in that area. Pain present off and on with certain movements. Has constipation and chills. Patient is been treated for pneumonia currently.     Patient is in today for abdominal pain  Symptoms started approx 5 days ago.  States that the discomfort is constant and feels like a fullness. Able to eat but does not feel like eating. Currently being treated for pna. Last does of abx will be tomorrow States history of constipation. Does feel the same Does not have her galbladder any more. No history of ulcers. Patient is on Elquis. No blood in stool per report.    Past Medical History:  Diagnosis Date   Arthritis    Back pain    Chronic anticoagulation    due to aflutter   Chronic kidney disease    Diabetes mellitus    Diastolic CHF, chronic ([place])    a.  echo 2006 - ef 55-65%; mild diast dysfxn;    b. Echo 08/2011: Mild LVH, EF 60%;  c. 04/2013 Echo: EF 65-69%, mild conc LVH;  08/2014 Echo: EF 60-65%, mild-mod MR.   Gout    Hyperlipidemia    Hypertension    a.  Renal arterial Dopplers 12/2011: 1-59% right renal artery stenosis   Morbid obesity ([place])    Obstructive sleep apnea on CPAP    Paroxysmal Afib/Flutter    a. 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    Past Surgical History:  Procedure Laterality Date   APPENDECTOMY     ATRIAL FLUTTER ABLATION N/A 09/24/2011   Procedure: ATRIAL FLUTTER ABLATION;  Surgeon: [name], MD;  Location: [facility] CATH LAB;  Service: Cardiovascular;  Laterality: N/A;   CARDIOVERSION  10/22/2011   Procedure: CARDIOVERSION;  Surgeon: [name], MD;  Location: [facility];  Service: Cardiovascular;  Laterality: N/A;   CARDIOVERSION N/A 09/10/2011   Procedure: CARDIOVERSION;  Surgeon: [name], MD;   Location: [facility] CATH LAB;  Service: Cardiovascular;  Laterality: N/A;   CHOLECYSTECTOMY     COLONOSCOPY WITH PROPOFOL N/A 06/13/2021   Procedure: COLONOSCOPY WITH PROPOFOL;  Surgeon: Milus Banister, MD;  Location: [facility] ENDOSCOPY;  Service: Endoscopy;  Laterality: N/A;   POLYPECTOMY  06/13/2021   Procedure: POLYPECTOMY;  Surgeon: Milus Banister, MD;  Location: [facility] ENDOSCOPY;  Service: Endoscopy;;   TONSILLECTOMY  1982   TOTAL ABDOMINAL HYSTERECTOMY      Family History  Problem Relation Age of Onset   Heart disease Father    Hypertension Father    Breast cancer Sister    Cancer Sister        breast   Colon cancer Neg Hx    Esophageal cancer Neg Hx    Pancreatic cancer Neg Hx    Liver disease Neg Hx     Social History   Socioeconomic History   Marital status: Married    Spouse name: Not on file   Number of children: 3   Years of education: Not on file   Highest education level: Not on file  Occupational History   Occupation: DISABILITY/housewife    Employer: RETIRED  Tobacco Use   Smoking status: Never   Smokeless tobacco: Never  Vaping Use   Vaping Use: Never used  Substance  and Sexual Activity   Alcohol use: No   Drug use: No   Sexual activity: Yes  Other Topics Concern   Not on file  Social History Narrative   Not on file   Social Determinants of Health   Financial Resource Strain: Not on file  Food Insecurity: No Food Insecurity   Worried About Running Out of Food in the Last Year: Never true   Ran Out of Food in the Last Year: Never true  Transportation Needs: No Transportation Needs   Lack of Transportation (Medical): No   Lack of Transportation (Non-Medical): No  Physical Activity: Not on file  Stress: Not on file  Social Connections: Not on file  Intimate Partner Violence: Not on file    Outpatient Medications Prior to Visit  Medication Sig Dispense Refill   Accu-Chek Softclix Lancets lancets 1 each by Other route 3 (three) times daily. as directed  100 each 3   allopurinol (ZYLOPRIM) 300 MG tablet TAKE 1 TABLET(300 MG) BY MOUTH DAILY 90 tablet 3   amiodarone (PACERONE) 200 MG tablet Take 1 tablet (200 mg total) by mouth daily. 90 tablet 2   amoxicillin-clavulanate (AUGMENTIN) 500-125 MG tablet Take 1 tablet (500 mg total) by mouth 2 (two) times daily. 14 tablet 0   blood glucose meter kit and supplies Dispense based on patient and insurance preference. Use up to four times daily as directed. (FOR ICD-10 E10.9, E11.9). (Patient taking differently: 1 each by Other route See admin instructions. Dispense based on patient and insurance preference. Use up to four times daily as directed. (FOR ICD-10 E10.9, E11.9).) 1 each 0   cloNIDine (CATAPRES) 0.3 MG tablet Take 1 tablet (0.3 mg total) by mouth 2 (two) times daily. 180 tablet 3   Continuous Blood Gluc Receiver (FREESTYLE LIBRE 14 DAY READER) DEVI Use as directed. 1 each 0   Continuous Blood Gluc Sensor (FREESTYLE LIBRE 14 DAY SENSOR) MISC Use as directed to check blood sugar daily 2 each 3   cyclobenzaprine (FLEXERIL) 5 MG tablet Take 1 tablet (5 mg total) by mouth as needed for muscle spasms. 30 tablet 0   diltiazem (CARDIZEM) 30 MG tablet Take 1 tablet (30 mg total) by mouth See admin instructions. Take one tablet every 4 hours as needed for heart rate greater than 100 and blood pressure needs to be above 100 45 tablet 2   ELIQUIS 5 MG TABS tablet TAKE 1 TABLET(5 MG) BY MOUTH TWICE DAILY 180 tablet 1   Insulin Lispro Prot & Lispro (HUMALOG MIX 75/25 KWIKPEN) (75-25) 100 UNIT/ML Kwikpen Inject 17 Units into the skin 2 (two) times daily. 30 mL 3   lidocaine (LIDODERM) 5 % Place 1 patch onto the skin daily. Remove & Discard patch within 12 hours or as directed by MD (Patient taking differently: Place 1 patch onto the skin daily as needed (Pain). Remove & Discard patch within 12 hours or as directed by MD) 30 patch 0   loperamide (IMODIUM) 2 MG capsule Take 1 capsule (2 mg total) by mouth 4 (four)  times daily as needed for diarrhea or loose stools. 12 capsule 0   losartan (COZAAR) 100 MG tablet TAKE 1 TABLET(100 MG) BY MOUTH DAILY 90 tablet 3   Na Sulfate-K Sulfate-Mg Sulf (SUPREP BOWEL PREP KIT) 17.5-3.13-1.6 GM/177ML SOLN Take 1 kit by mouth as directed. 324 mL 0   NOVOFINE PLUS PEN NEEDLE 32G X 4 MM MISC USE TWICE DAILY AS DIRECTED (Patient taking differently: 1 each by Other route  as directed.) 100 each 11   Olopatadine HCl 0.2 % SOLN Place 1 drop into the right eye daily.     ONETOUCH VERIO test strip 1 each by Other route 3 (three) times daily.     potassium chloride (KLOR-CON) 10 MEQ tablet Take 2 tablets (20 mEq total) by mouth daily. 180 tablet 3   RESTASIS MULTIDOSE 0.05 % ophthalmic emulsion Place 1 drop into both eyes 2 (two) times daily.      rosuvastatin (CRESTOR) 10 MG tablet TAKE 1 TABLET BY MOUTH IN  THE EVENING (Patient taking differently: Take 10 mg by mouth every evening.) 90 tablet 3   Semaglutide, 1 MG/DOSE, (OZEMPIC, 1 MG/DOSE,) 4 MG/3ML SOPN Inject 1 mg into the skin once a week. 9 mL 3   torsemide (DEMADEX) 20 MG tablet Take 1 tablet (20 mg total) by mouth daily.     triamcinolone cream (KENALOG) 0.1 % Apply 1 application topically 2 (two) times daily. (Patient taking differently: Apply 1 application topically daily as needed (Rash under breast).) 30 g 0   verapamil (CALAN) 120 MG tablet Take 1 tablet (120 mg total) by mouth in the morning. 180 tablet 3   levothyroxine (SYNTHROID) 75 MCG tablet Take 1 tablet (75 mcg total) by mouth daily before breakfast. 90 tablet 1   No facility-administered medications prior to visit.    Allergies  Allergen Reactions   Doxycycline     Made patient feel very ill    Metformin Diarrhea    Review of Systems  Constitutional:  Positive for chills. Negative for fever.  Respiratory:  Positive for shortness of breath (Baseline per patient. Nothing new). Negative for cough.   Cardiovascular:  Negative for chest pain.   Gastrointestinal:  Positive for abdominal pain and constipation. Negative for diarrhea, nausea and vomiting.  Genitourinary:        Negative urinary symptoms      Objective:    Physical Exam Constitutional:      Appearance: She is obese.  Cardiovascular:     Rate and Rhythm: Normal rate and regular rhythm.  Pulmonary:     Effort: Pulmonary effort is normal.     Breath sounds: Normal breath sounds.  Abdominal:     General: Bowel sounds are normal.     Tenderness: There is abdominal tenderness in the right upper quadrant and epigastric area. There is no guarding or rebound.     Hernia: No hernia is present.  Skin:    General: Skin is warm.          Comments: Bruising to bilateral lower abdomen where patient gives herself injections   Neurological:     Mental Status: She is alert.     Comments: Walks with a cane     There were no vitals taken for this visit. Wt Readings from Last 3 Encounters:  08/12/21 244 lb 3.2 oz (110.8 kg)  07/30/21 247 lb (112 kg)  07/24/21 247 lb (112 kg)    Health Maintenance Due  Topic Date Due   Hepatitis C Screening  Never done   Zoster Vaccines- Shingrix (1 of 2) Never done   OPHTHALMOLOGY EXAM  04/18/2021    There are no preventive care reminders to display for this patient.   Lab Results  Component Value Date   TSH 3.350 03/12/2021   Lab Results  Component Value Date   WBC 11.2 (H) 07/17/2021   HGB 11.7 (L) 07/17/2021   HCT 37.1 07/17/2021   MCV 81.4 07/17/2021  PLT 352.0 07/17/2021   Lab Results  Component Value Date   NA 139 07/17/2021   K 3.5 07/17/2021   CO2 31 07/17/2021   GLUCOSE 117 (H) 07/17/2021   BUN 9 07/17/2021   CREATININE 0.61 07/17/2021   BILITOT 0.5 07/17/2021   ALKPHOS 100 07/17/2021   AST 18 07/17/2021   ALT 15 07/17/2021   PROT 7.8 07/17/2021   ALBUMIN 3.9 07/17/2021   CALCIUM 9.1 07/17/2021   ANIONGAP 9 08/21/2020   EGFR 83 03/12/2021   GFR 86.02 07/17/2021   Lab Results  Component Value  Date   CHOL 135 09/12/2020   Lab Results  Component Value Date   HDL 62 09/12/2020   Lab Results  Component Value Date   LDLCALC 57 09/12/2020   Lab Results  Component Value Date   TRIG 84 09/12/2020   Lab Results  Component Value Date   CHOLHDL 2.2 09/12/2020   Lab Results  Component Value Date   HGBA1C 7.8 (A) 05/24/2021       Assessment & Plan:   Problem List Items Addressed This Visit       Other   Abdominal pain - Primary    Tenderness in right upper and epigastric area.  Patient is complex does not have gallbladder is currently battling pneumonia right lower lobe.  She was referred pain.  Does have follow-up with pulmonologist she is finishing antibiotic's today.  Will obtain basic labs and abdominal x-ray just to make sure patient is not constipated although she had a bowel movement yesterday.  Patient has history of constipation did give signs and symptoms of when to seek emergent or urgent health care.  Patient knowledge.  Pending lab and x-ray results.      Relevant Orders   CBC with Differential/Platelet   Lipase   Comprehensive metabolic panel   DG Abd 2 Views     No orders of the defined types were placed in this encounter.  This visit occurred during the SARS-CoV-2 public health emergency.  Safety protocols were in place, including screening questions prior to the visit, additional usage of staff PPE, and extensive cleaning of exam room while observing appropriate contact time as indicated for disinfecting solutions.   Romilda Garret, NP

## 2021-08-23 NOTE — Assessment & Plan Note (Signed)
Tenderness in right upper and epigastric area.  Patient is complex does not have gallbladder is currently battling pneumonia right lower lobe.  She was referred pain.  Does have follow-up with pulmonologist she is finishing antibiotic's today.  Will obtain basic labs and abdominal x-ray just to make sure patient is not constipated although she had a bowel movement yesterday.  Patient has history of constipation did give signs and symptoms of when to seek emergent or urgent health care.  Patient knowledge.  Pending lab and x-ray results.

## 2021-08-23 NOTE — Patient Instructions (Signed)
Nice to see you today If you start vomiting, have blood in the stool, chest pain or new shortness of breath go to the hospital I will be in touch with the lab results

## 2021-08-26 ENCOUNTER — Other Ambulatory Visit (HOSPITAL_COMMUNITY): Payer: Self-pay | Admitting: *Deleted

## 2021-08-26 ENCOUNTER — Telehealth: Payer: Self-pay | Admitting: Emergency Medicine

## 2021-08-26 MED ORDER — DILTIAZEM HCL 30 MG PO TABS: 30.0000 mg | ORAL_TABLET | ORAL | 2 refills | Status: DC

## 2021-08-26 NOTE — Telephone Encounter (Signed)
Pt. Connected to [facility] on 11.4.2022.    Caller states that she is out of Crestor and she wants to know if it is okay to go without the medication over the weekend or if she can get a 5 or 6 tablets from a local pharmacy until she gets the mail order rx. Caller states that she has high blood pressure after talking to the mail order pharmacy.    States no pharmacies have a current Rx on hand due to she has been getting it free through mail order. Denies any new or worsening symptoms at this time. Has been advised to monitor diet and call on Monday for refill due to it will not be received at least until the 11th by mail.

## 2021-08-28 NOTE — Telephone Encounter (Signed)
Called and spoke with pt about medication. Made pt  an OV for 08/29/21.

## 2021-08-29 ENCOUNTER — Encounter: Payer: Self-pay | Admitting: Emergency Medicine

## 2021-08-29 ENCOUNTER — Telehealth: Payer: Self-pay | Admitting: Emergency Medicine

## 2021-08-29 ENCOUNTER — Ambulatory Visit (INDEPENDENT_AMBULATORY_CARE_PROVIDER_SITE_OTHER): Payer: Medicare Other | Admitting: Emergency Medicine

## 2021-08-29 ENCOUNTER — Ambulatory Visit (INDEPENDENT_AMBULATORY_CARE_PROVIDER_SITE_OTHER): Payer: Medicare Other

## 2021-08-29 ENCOUNTER — Other Ambulatory Visit: Payer: Self-pay

## 2021-08-29 VITALS — BP 132/80 | HR 64 | Temp 97.8°F | Ht 61.0 in | Wt 248.0 lb

## 2021-08-29 DIAGNOSIS — J189 Pneumonia, unspecified organism: Secondary | ICD-10-CM | POA: Diagnosis not present

## 2021-08-29 DIAGNOSIS — Z794 Long term (current) use of insulin: Secondary | ICD-10-CM | POA: Diagnosis not present

## 2021-08-29 DIAGNOSIS — E1159 Type 2 diabetes mellitus with other circulatory complications: Secondary | ICD-10-CM

## 2021-08-29 DIAGNOSIS — K5909 Other constipation: Secondary | ICD-10-CM

## 2021-08-29 DIAGNOSIS — E1165 Type 2 diabetes mellitus with hyperglycemia: Secondary | ICD-10-CM | POA: Diagnosis not present

## 2021-08-29 DIAGNOSIS — Z8701 Personal history of pneumonia (recurrent): Secondary | ICD-10-CM

## 2021-08-29 DIAGNOSIS — I517 Cardiomegaly: Secondary | ICD-10-CM | POA: Diagnosis not present

## 2021-08-29 DIAGNOSIS — I152 Hypertension secondary to endocrine disorders: Secondary | ICD-10-CM

## 2021-08-29 LAB — LIPID PANEL
Cholesterol: 141 mg/dL (ref 0–200)
HDL: 65.1 mg/dL (ref >39.00)
LDL Cholesterol: 51 mg/dL (ref 0–99)
NonHDL: 76.14
Total CHOL/HDL Ratio: 2
Triglycerides: 127 mg/dL (ref 0.0–149.0)
VLDL: 25.4 mg/dL (ref 0.0–40.0)

## 2021-08-29 LAB — HEMOGLOBIN A1C: Hgb A1c MFr Bld: 8.6 % — ABNORMAL HIGH (ref 4.6–6.5)

## 2021-08-29 MED ORDER — BLOOD GLUCOSE METER KIT: PACK | 0 refills | Status: AC

## 2021-08-29 MED ORDER — FREESTYLE LIBRE 14 DAY READER DEVI: 0 refills | Status: DC

## 2021-08-29 MED ORDER — LINACLOTIDE 145 MCG PO CAPS
145.0000 ug | ORAL_CAPSULE | Freq: Every day | ORAL | 1 refills | Status: DC
Start: 2021-08-29 — End: 2021-08-29

## 2021-08-29 MED ORDER — LINACLOTIDE 145 MCG PO CAPS: 145.0000 ug | ORAL_CAPSULE | Freq: Every day | ORAL | 1 refills | Status: DC

## 2021-08-29 MED ORDER — FREESTYLE LIBRE 14 DAY SENSOR MISC: 3 refills | Status: DC

## 2021-08-29 NOTE — Patient Instructions (Signed)
Health Maintenance After Age 78 After age 78, you are at a higher risk for certain long-term diseases and infections as well as injuries from falls. Falls are a major cause of broken bones and head injuries in people who are older than age 78. Getting regular preventive care can help to keep you healthy and well. Preventive care includes getting regular testing and making lifestyle changes as recommended by your health care provider. Talk with your health care provider about: Which screenings and tests you should have. A screening is a test that checks for a disease when you have no symptoms. A diet and exercise plan that is right for you. What should I know about screenings and tests to prevent falls? Screening and testing are the best ways to find a health problem early. Early diagnosis and treatment give you the best chance of managing medical conditions that are common after age 78. Certain conditions and lifestyle choices may make you more likely to have a fall. Your health care provider may recommend: Regular vision checks. Poor vision and conditions such as cataracts can make you more likely to have a fall. If you wear glasses, make sure to get your prescription updated if your vision changes. Medicine review. Work with your health care provider to regularly review all of the medicines you are taking, including over-the-counter medicines. Ask your health care provider about any side effects that may make you more likely to have a fall. Tell your health care provider if any medicines that you take make you feel dizzy or sleepy. Strength and balance checks. Your health care provider may recommend certain tests to check your strength and balance while standing, walking, or changing positions. Foot health exam. Foot pain and numbness, as well as not wearing proper footwear, can make you more likely to have a fall. Screenings, including: Osteoporosis screening. Osteoporosis is a condition that causes  the bones to get weaker and break more easily. Blood pressure screening. Blood pressure changes and medicines to control blood pressure can make you feel dizzy. Depression screening. You may be more likely to have a fall if you have a fear of falling, feel depressed, or feel unable to do activities that you used to do. Alcohol use screening. Using too much alcohol can affect your balance and may make you more likely to have a fall. Follow these instructions at home: Lifestyle Do not drink alcohol if: Your health care provider tells you not to drink. If you drink alcohol: Limit how much you have to: 0-1 drink a day for women. 0-2 drinks a day for men. Know how much alcohol is in your drink. In the [place] one drink equals one 12 oz bottle of beer (355 mL), one 5 oz glass of wine (148 mL), or one 1 oz glass of hard liquor (44 mL). Do not use any products that contain nicotine or tobacco. These products include cigarettes, chewing tobacco, and vaping devices, such as e-cigarettes. If you need help quitting, ask your health care provider. Activity  Follow a regular exercise program to stay fit. This will help you maintain your balance. Ask your health care provider what types of exercise are appropriate for you. If you need a cane or walker, use it as recommended by your health care provider. Wear supportive shoes that have nonskid soles. Safety  Remove any tripping hazards, such as rugs, cords, and clutter. Install safety equipment such as grab bars in bathrooms and safety rails on stairs. Keep rooms and walkways   well-lit. General instructions Talk with your health care provider about your risks for falling. Tell your health care provider if: You fall. Be sure to tell your health care provider about all falls, even ones that seem minor. You feel dizzy, tiredness (fatigue), or off-balance. Take over-the-counter and prescription medicines only as told by your health care provider. These include  supplements. Eat a healthy diet and maintain a healthy weight. A healthy diet includes low-fat dairy products, low-fat (lean) meats, and fiber from whole grains, beans, and lots of fruits and vegetables. Stay current with your vaccines. Schedule regular health, dental, and eye exams. Summary Having a healthy lifestyle and getting preventive care can help to protect your health and wellness after age 78. Screening and testing are the best way to find a health problem early and help you avoid having a fall. Early diagnosis and treatment give you the best chance for managing medical conditions that are more common for people who are older than age 78. Falls are a major cause of broken bones and head injuries in people who are older than age 78. Take precautions to prevent a fall at home. Work with your health care provider to learn what changes you can make to improve your health and wellness and to prevent falls. This information is not intended to replace advice given to you by your health care provider. Make sure you discuss any questions you have with your health care provider. Document Revised: 02/25/2021 Document Reviewed: 02/25/2021 Elsevier Patient Education  2022 [employer]  

## 2021-08-29 NOTE — Assessment & Plan Note (Signed)
Resolved.  Asymptomatic.  Off antibiotics.  Much improved chest x-ray.

## 2021-08-29 NOTE — Telephone Encounter (Signed)
Left message regarding chest x-ray results.

## 2021-08-29 NOTE — Progress Notes (Signed)
[name] 80 y.o.   Chief Complaint  Patient presents with   Pneumonia    Pt had pneumonia 07/24/21, pt would like to know if it is gone.Pt states that the antibiotic that was prescribed made her constipation.   Medication Refill    Pen needles and lancets, freestyle libre pump    HISTORY OF PRESENT ILLNESS: This is a 80 y.o. female here for follow-up of pneumonia.  Finished antibiotics 1 week ago. Feeling much better.  Denies fever.  Denies difficulty breathing.  No longer coughing.  Asymptomatic. Complaining of chronic constipation. Patient has history of diabetes and sees endocrinologist on a regular basis.  Needs refills on needles, lancets, and pump. No other complaints or medical concerns today.  Pneumonia There is no cough, shortness of breath, sputum production or wheezing. Pertinent negatives include no chest pain, fever, headaches or sore throat.  Medication Refill Pertinent negatives include no abdominal pain, chest pain, chills, congestion, coughing, fever, headaches, nausea, rash, sore throat or vomiting.    Prior to Admission medications   Medication Sig Start Date End Date Taking? Authorizing Provider  Accu-Chek Softclix Lancets lancets 1 each by Other route 3 (three) times daily. as directed 03/30/20  Yes Maximiano Coss, NP  allopurinol (ZYLOPRIM) 300 MG tablet TAKE 1 TABLET(300 MG) BY MOUTH DAILY 07/02/21  Yes Kara Mierzejewski, Ines Bloomer, MD  amiodarone (PACERONE) 200 MG tablet Take 1 tablet (200 mg total) by mouth daily. 05/27/21  Yes [name], MD  blood glucose meter kit and supplies Dispense based on patient and insurance preference. Use up to four times daily as directed. (FOR ICD-10 E10.9, E11.9). Patient taking differently: 1 each by Other route See admin instructions. Dispense based on patient and insurance preference. Use up to four times daily as directed. (FOR ICD-10 E10.9, E11.9). 05/03/20  Yes Glinda Natzke, Ines Bloomer, MD  cloNIDine (CATAPRES) 0.3 MG tablet  Take 1 tablet (0.3 mg total) by mouth 2 (two) times daily. 07/02/21  Yes Arian Murley, Ines Bloomer, MD  Continuous Blood Gluc Receiver (FREESTYLE LIBRE 14 DAY READER) DEVI Use as directed. 04/18/20  Yes Endi Lagman, Ines Bloomer, MD  Continuous Blood Gluc Sensor (FREESTYLE LIBRE 14 DAY SENSOR) MISC Use as directed to check blood sugar daily 09/24/20  Yes [name], Melanie Crazier, MD  cyclobenzaprine (FLEXERIL) 5 MG tablet Take 1 tablet (5 mg total) by mouth as needed for muscle spasms. 06/18/21  Yes Audwin Semper, Ines Bloomer, MD  diltiazem (CARDIZEM) 30 MG tablet Take 1 tablet (30 mg total) by mouth See admin instructions. Take one tablet every 4 hours as needed for heart rate greater than 100 08/26/21  Yes Sherran Needs, NP  ELIQUIS 5 MG TABS tablet TAKE 1 TABLET(5 MG) BY MOUTH TWICE DAILY 06/17/21  Yes [name], MD  Insulin Lispro Prot & Lispro (HUMALOG MIX 75/25 KWIKPEN) (75-25) 100 UNIT/ML Kwikpen Inject 17 Units into the skin 2 (two) times daily. 05/24/21  Yes [name], Melanie Crazier, MD  lidocaine (LIDODERM) 5 % Place 1 patch onto the skin daily. Remove & Discard patch within 12 hours or as directed by MD Patient taking differently: Place 1 patch onto the skin daily as needed (Pain). Remove & Discard patch within 12 hours or as directed by MD 08/29/20  Yes Mcarthur Rossetti, MD  loperamide (IMODIUM) 2 MG capsule Take 1 capsule (2 mg total) by mouth 4 (four) times daily as needed for diarrhea or loose stools. 03/20/20  Yes Garald Balding, PA-C  losartan (COZAAR) 100 MG tablet  TAKE 1 TABLET(100 MG) BY MOUTH DAILY 08/04/21  Yes Lylia Karn, Ines Bloomer, MD  Na Sulfate-K Sulfate-Mg Sulf (SUPREP BOWEL PREP KIT) 17.5-3.13-1.6 GM/177ML SOLN Take 1 kit by mouth as directed. 06/12/21  Yes Milus Banister, MD  NOVOFINE PLUS PEN NEEDLE 32G X 4 MM MISC USE TWICE DAILY AS DIRECTED Patient taking differently: 1 each by Other route as directed. 04/08/21  Yes Eria Lozoya, Ines Bloomer, MD  Olopatadine HCl 0.2 % SOLN  Place 1 drop into the right eye daily. 02/21/21  Yes [provider]  ONETOUCH VERIO test strip 1 each by Other route 3 (three) times daily. 06/08/20  Yes [provider]  potassium chloride (KLOR-CON) 10 MEQ tablet Take 2 tablets (20 mEq total) by mouth daily. 03/20/21  Yes Seiler, Amber K, NP  RESTASIS MULTIDOSE 0.05 % ophthalmic emulsion Place 1 drop into both eyes 2 (two) times daily.  03/04/20  Yes [provider]  rosuvastatin (CRESTOR) 10 MG tablet TAKE 1 TABLET BY MOUTH IN  THE EVENING Patient taking differently: Take 10 mg by mouth every evening. 04/25/21  Yes Thane Age, Ines Bloomer, MD  Semaglutide, 1 MG/DOSE, (OZEMPIC, 1 MG/DOSE,) 4 MG/3ML SOPN Inject 1 mg into the skin once a week. 05/24/21  Yes [name], Melanie Crazier, MD  torsemide (DEMADEX) 20 MG tablet Take 1 tablet (20 mg total) by mouth daily. 05/30/21  Yes Sherran Needs, NP  triamcinolone cream (KENALOG) 0.1 % Apply 1 application topically 2 (two) times daily. Patient taking differently: Apply 1 application topically daily as needed (Rash under breast). 07/26/20  Yes Nakya Weyand, Ines Bloomer, MD  verapamil (CALAN) 120 MG tablet Take 1 tablet (120 mg total) by mouth in the morning. 08/16/21  Yes [name], MD  levothyroxine (SYNTHROID) 75 MCG tablet Take 1 tablet (75 mcg total) by mouth daily before breakfast. 01/23/21 06/07/21  Horald Pollen, MD  linaclotide [facility]) 145 MCG CAPS capsule Take 1 capsule (145 mcg total) by mouth daily before breakfast. 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   Persistent atrial fibrillation ([place]) 08/12/2020   Type 2 diabetes mellitus with hyperglycemia, with long-term current use of insulin ([place]) 06/21/2020   Type 2 diabetes mellitus with diabetic polyneuropathy, with long-term current use of insulin ([place]) 06/21/2020   Mixed hyperlipidemia 06/21/2020   Uncontrolled hypertension 06/11/2020   Aortic atherosclerosis ([place]) 03/28/2020   Diabetic peripheral neuropathy ([place]) 12/26/2019   Gastroesophageal reflux disease without esophagitis 03/17/2019   Bilateral leg edema 02/16/2019   Pruritus 02/16/2019   Varicose veins of both legs with edema 02/16/2019   Heart murmur 09/05/2018   Restrictive lung disease 02/22/2018   Dysgeusia 06/11/2017   Osteoarthritis of left knee 06/03/2017   NAFLD (nonalcoholic fatty liver disease) 08/07/2016   Osteoporosis 12/31/2015   Obesity 12/19/2015   Hypersomnia 12/19/2015   Edema 10/18/2015   Acquired hypothyroidism 68/08/5725   Acute diastolic congestive heart failure, NYHA class 3 ([place]) 10/03/2013   Prolonged QT interval  29/52/8413   Acute diastolic heart failure ([place]) 10/03/2013   Long term (current) use of anticoagulants 06/09/2012   BENIGN NEOPLASM OF ADRENAL GLAND 11/25/2010   Chronic diastolic heart failure ([place]) 02/20/2010   DM 05/16/2009   GOUT 05/16/2009   OBESITY, MORBID 05/16/2009   Hypertension associated with diabetes ([place]) 05/16/2009   Paroxysmal atrial fibrillation ([place]) 05/16/2009   HYPERLIPIDEMIA 11/30/2008   Obstructive sleep apnea on CPAP 11/30/2008    Past Medical History:  Diagnosis Date   Arthritis    Back pain    Chronic anticoagulation    due to aflutter   Chronic kidney disease    Diabetes mellitus    Diastolic CHF, chronic ([place])    a.  echo 2006 - ef 55-65%; mild diast dysfxn;    b. Echo 08/2011: Mild LVH, EF 60%;  c. 04/2013 Echo: EF 65-69%, mild conc LVH;   08/2014 Echo: EF 60-65%, mild-mod MR.   Gout    Hyperlipidemia    Hypertension    a.  Renal arterial Dopplers 12/2011: 1-59% right renal artery stenosis   Morbid obesity ([place])    Obstructive sleep apnea on CPAP    Paroxysmal Afib/Flutter    a. 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    Past Surgical History:  Procedure Laterality Date   APPENDECTOMY     ATRIAL FLUTTER ABLATION N/A 09/24/2011   Procedure: ATRIAL FLUTTER ABLATION;  Surgeon: [name], MD;  Location: [facility] CATH LAB;  Service: Cardiovascular;  Laterality: N/A;   CARDIOVERSION  10/22/2011   Procedure: CARDIOVERSION;  Surgeon: [name], MD;  Location: [facility];  Service: Cardiovascular;  Laterality: N/A;   CARDIOVERSION N/A 09/10/2011   Procedure: CARDIOVERSION;  Surgeon: [name], MD;  Location[facility] CATH LAB;  Service: Cardiovascular;  Laterality: N/A;   CHOLECYSTECTOMY     COLONOSCOPY WITH PROPOFOL N/A 06/13/2021   Procedure: COLONOSCOPY WITH PROPOFOL;  Surgeon: Milus Banister, MD;  Location: [facility] ENDOSCOPY;  Service: Endoscopy;  Laterality: N/A;   POLYPECTOMY  06/13/2021   Procedure: POLYPECTOMY;  Surgeon: Milus Banister, MD;  Location: [facility] ENDOSCOPY;  Service: Endoscopy;;   TONSILLECTOMY  1982   TOTAL ABDOMINAL HYSTERECTOMY      Social History   Socioeconomic History   Marital status: Married    Spouse name: Not on file   Number of children: 3   Years of education: Not on file   Highest education level: Not on file  Occupational History   Occupation: DISABILITY/housewife    Employer: RETIRED  Tobacco Use   Smoking status: Never   Smokeless tobacco: Never  Vaping Use   Vaping Use: Never used  Substance and Sexual Activity   Alcohol use: No   Drug use: No   Sexual activity: Yes  Other Topics Concern   Not on file  Social History Narrative   Not on file   Social Determinants of Health   Financial Resource Strain: Not on file  Food Insecurity: No Food Insecurity   Worried About Running Out of  Food in the Last Year: Never true   Ran Out of Food in the Last Year: Never true  Transportation Needs: No Transportation Needs   Lack of Transportation (Medical): No   Lack of Transportation (Non-Medical): No  Physical Activity: Not on file  Stress: Not on file  Social Connections: Not on file  Intimate Partner Violence: Not on file    Family History  Problem Relation Age of Onset   Heart disease Father  Hypertension Father    Breast cancer Sister    Cancer Sister        breast   Colon cancer Neg Hx    Esophageal cancer Neg Hx    Pancreatic cancer Neg Hx    Liver disease Neg Hx      Review of Systems  Constitutional: Negative.  Negative for chills and fever.  HENT: Negative.  Negative for congestion and sore throat.   Respiratory: Negative.  Negative for cough, sputum production, shortness of breath and wheezing.   Cardiovascular: Negative.  Negative for chest pain and palpitations.  Gastrointestinal:  Positive for constipation. Negative for abdominal pain, blood in stool, diarrhea, nausea and vomiting.  Genitourinary: Negative.  Negative for dysuria and hematuria.  Skin: Negative.  Negative for rash.  Neurological: Negative.  Negative for dizziness and headaches.  All other systems reviewed and are negative.  Vitals:   08/29/21 1104  BP: 132/80  Pulse: 64  Temp: 97.8 F (36.6 C)  SpO2: 98%    Physical Exam Vitals reviewed.  Constitutional:      Appearance: Normal appearance. She is obese.  HENT:     Head: Normocephalic.  Eyes:     Extraocular Movements: Extraocular movements intact.     Pupils: Pupils are equal, round, and reactive to light.  Cardiovascular:     Rate and Rhythm: Normal rate and regular rhythm.     Pulses: Normal pulses.     Heart sounds: Normal heart sounds.  Pulmonary:     Effort: Pulmonary effort is normal.     Breath sounds: Normal breath sounds.  Abdominal:     General: There is no distension.     Palpations: Abdomen is soft.      Tenderness: There is no abdominal tenderness.  Musculoskeletal:     Cervical back: No tenderness.  Lymphadenopathy:     Cervical: No cervical adenopathy.  Skin:    General: Skin is warm and dry.     Capillary Refill: Capillary refill takes less than 2 seconds.  Neurological:     General: No focal deficit present.     Mental Status: She is alert and oriented to person, place, and time.  Psychiatric:        Mood and Affect: Mood normal.        Behavior: Behavior normal.    Results for orders placed or performed in visit on 08/29/21 (from the past 24 hour(s))  Lipid panel     Status: None   Collection Time: 08/29/21 12:11 PM  Result Value Ref Range   Cholesterol 141 0 - 200 mg/dL   Triglycerides 127.0 0.0 - 149.0 mg/dL   HDL 65.10 >39.00 mg/dL   VLDL 25.4 0.0 - 40.0 mg/dL   LDL Cholesterol 51 0 - 99 mg/dL   Total CHOL/HDL Ratio 2    NonHDL 76.14   Hemoglobin A1c     Status: Abnormal   Collection Time: 08/29/21 12:11 PM  Result Value Ref Range   Hgb A1c MFr Bld 8.6 (H) 4.6 - 6.5 %   *Note: Due to a large number of results and/or encounters for the requested time period, some results have not been displayed. A complete set of results can be found in Results Review.   DG Chest 2 View  Result Date: 08/29/2021 CLINICAL DATA:  Pneumonia follow-up EXAM: CHEST - 2 VIEW COMPARISON:  08/12/2021 FINDINGS: Unchanged cardiac and mediastinal contours, with cardiomegaly. Slight decrease in previously noted right lung base opacity. No definite pleural effusion.  No new focal pulmonary opacity. No acute osseous abnormality. IMPRESSION: Slight decrease in previously noted right lung base opacity, possibly resolving pneumonia. Suggest follow-up to ensure resolution. Electronically Signed   By: Merilyn Baba M.D.   On: 08/29/2021 12:28    ASSESSMENT & PLAN: Problem List Items Addressed This Visit       Cardiovascular and Mediastinum   Hypertension associated with diabetes ([place])     Respiratory    CAP (community acquired pneumonia) - Primary    Resolved.  Asymptomatic.  Off antibiotics.  Much improved chest x-ray.        Endocrine   Type 2 diabetes mellitus with hyperglycemia, with long-term current use of insulin ([place])    Uncontrolled diabetes with hemoglobin A1c of 8.6.  Patient needs to follow-up with endocrinologist as already scheduled. Lab Results  Component Value Date   HGBA1C 8.6 (H) 08/29/2021         Other Visit Diagnoses     History of recent pneumonia       Relevant Orders   DG Chest 2 View (Completed)   Other constipation       Relevant Medications   linaclotide (LINZESS) 145 MCG CAPS capsule   Uncontrolled type 2 diabetes mellitus with hyperglycemia (HCC)       Relevant Medications   blood glucose meter kit and supplies   Continuous Blood Gluc Receiver (FREESTYLE LIBRE 14 DAY READER) DEVI   Continuous Blood Gluc Sensor (FREESTYLE LIBRE 14 DAY SENSOR) MISC      Patient Instructions  Health Maintenance After Age 18 After age 51, you are at a higher risk for certain long-term diseases and infections as well as injuries from falls. Falls are a major cause of broken bones and head injuries in people who are older than age 27. Getting regular preventive care can help to keep you healthy and well. Preventive care includes getting regular testing and making lifestyle changes as recommended by your health care provider. Talk with your health care provider about: Which screenings and tests you should have. A screening is a test that checks for a disease when you have no symptoms. A diet and exercise plan that is right for you. What should I know about screenings and tests to prevent falls? Screening and testing are the best ways to find a health problem early. Early diagnosis and treatment give you the best chance of managing medical conditions that are common after age 27. Certain conditions and lifestyle choices may make you more likely to have a fall. Your  health care provider may recommend: Regular vision checks. Poor vision and conditions such as cataracts can make you more likely to have a fall. If you wear glasses, make sure to get your prescription updated if your vision changes. Medicine review. Work with your health care provider to regularly review all of the medicines you are taking, including over-the-counter medicines. Ask your health care provider about any side effects that may make you more likely to have a fall. Tell your health care provider if any medicines that you take make you feel dizzy or sleepy. Strength and balance checks. Your health care provider may recommend certain tests to check your strength and balance while standing, walking, or changing positions. Foot health exam. Foot pain and numbness, as well as not wearing proper footwear, can make you more likely to have a fall. Screenings, including: Osteoporosis screening. Osteoporosis is a condition that causes the bones to get weaker and break more easily. Blood pressure  screening. Blood pressure changes and medicines to control blood pressure can make you feel dizzy. Depression screening. You may be more likely to have a fall if you have a fear of falling, feel depressed, or feel unable to do activities that you used to do. Alcohol use screening. Using too much alcohol can affect your balance and may make you more likely to have a fall. Follow these instructions at home: Lifestyle Do not drink alcohol if: Your health care provider tells you not to drink. If you drink alcohol: Limit how much you have to: 0-1 drink a day for women. 0-2 drinks a day for men. Know how much alcohol is in your drink. In the [place] one drink equals one 12 oz bottle of beer (355 mL), one 5 oz glass of wine (148 mL), or one 1 oz glass of hard liquor (44 mL). Do not use any products that contain nicotine or tobacco. These products include cigarettes, chewing tobacco, and vaping devices, such as  e-cigarettes. If you need help quitting, ask your health care provider. Activity  Follow a regular exercise program to stay fit. This will help you maintain your balance. Ask your health care provider what types of exercise are appropriate for you. If you need a cane or walker, use it as recommended by your health care provider. Wear supportive shoes that have nonskid soles. Safety  Remove any tripping hazards, such as rugs, cords, and clutter. Install safety equipment such as grab bars in bathrooms and safety rails on stairs. Keep rooms and walkways well-lit. General instructions Talk with your health care provider about your risks for falling. Tell your health care provider if: You fall. Be sure to tell your health care provider about all falls, even ones that seem minor. You feel dizzy, tiredness (fatigue), or off-balance. Take over-the-counter and prescription medicines only as told by your health care provider. These include supplements. Eat a healthy diet and maintain a healthy weight. A healthy diet includes low-fat dairy products, low-fat (lean) meats, and fiber from whole grains, beans, and lots of fruits and vegetables. Stay current with your vaccines. Schedule regular health, dental, and eye exams. Summary Having a healthy lifestyle and getting preventive care can help to protect your health and wellness after age 40. Screening and testing are the best way to find a health problem early and help you avoid having a fall. Early diagnosis and treatment give you the best chance for managing medical conditions that are more common for people who are older than age 48. Falls are a major cause of broken bones and head injuries in people who are older than age 46. Take precautions to prevent a fall at home. Work with your health care provider to learn what changes you can make to improve your health and wellness and to prevent falls. This information is not intended to replace advice given  to you by your health care provider. Make sure you discuss any questions you have with your health care provider. Document Revised: 02/25/2021 Document Reviewed: 02/25/2021 Elsevier Patient Education  2022 [place], MD [facility]

## 2021-08-29 NOTE — Assessment & Plan Note (Signed)
Uncontrolled diabetes with hemoglobin A1c of 8.6.  Patient needs to follow-up with endocrinologist as already scheduled. Lab Results  Component Value Date   HGBA1C 8.6 (H) 08/29/2021

## 2021-09-04 DIAGNOSIS — G4733 Obstructive sleep apnea (adult) (pediatric): Secondary | ICD-10-CM | POA: Diagnosis not present

## 2021-09-04 DIAGNOSIS — R0683 Snoring: Secondary | ICD-10-CM | POA: Diagnosis not present

## 2021-09-04 DIAGNOSIS — I10 Essential (primary) hypertension: Secondary | ICD-10-CM | POA: Diagnosis not present

## 2021-09-06 ENCOUNTER — Other Ambulatory Visit: Payer: Self-pay | Admitting: Emergency Medicine

## 2021-09-06 DIAGNOSIS — I152 Hypertension secondary to endocrine disorders: Secondary | ICD-10-CM

## 2021-09-06 DIAGNOSIS — E1159 Type 2 diabetes mellitus with other circulatory complications: Secondary | ICD-10-CM

## 2021-09-07 DIAGNOSIS — E1142 Type 2 diabetes mellitus with diabetic polyneuropathy: Secondary | ICD-10-CM | POA: Diagnosis not present

## 2021-09-07 DIAGNOSIS — Z794 Long term (current) use of insulin: Secondary | ICD-10-CM | POA: Diagnosis not present

## 2021-09-18 ENCOUNTER — Ambulatory Visit (INDEPENDENT_AMBULATORY_CARE_PROVIDER_SITE_OTHER): Payer: Medicare Other | Admitting: Emergency Medicine

## 2021-09-18 ENCOUNTER — Encounter: Payer: Self-pay | Admitting: Emergency Medicine

## 2021-09-18 ENCOUNTER — Other Ambulatory Visit: Payer: Self-pay

## 2021-09-18 VITALS — BP 132/82 | HR 71 | Ht 61.0 in | Wt 243.0 lb

## 2021-09-18 DIAGNOSIS — B9789 Other viral agents as the cause of diseases classified elsewhere: Secondary | ICD-10-CM | POA: Diagnosis not present

## 2021-09-18 DIAGNOSIS — R051 Acute cough: Secondary | ICD-10-CM

## 2021-09-18 DIAGNOSIS — R6889 Other general symptoms and signs: Secondary | ICD-10-CM | POA: Diagnosis not present

## 2021-09-18 DIAGNOSIS — J988 Other specified respiratory disorders: Secondary | ICD-10-CM

## 2021-09-18 LAB — POC INFLUENZA A&B (BINAX/QUICKVUE)
Influenza A, POC: NEGATIVE
Influenza B, POC: NEGATIVE

## 2021-09-18 MED ORDER — HYDROCODONE BIT-HOMATROP MBR 5-1.5 MG/5ML PO SOLN: 5.0000 mL | Freq: Every evening | ORAL | 0 refills | Status: DC | PRN

## 2021-09-18 NOTE — Progress Notes (Signed)
[name] 80 y.o.   Chief Complaint  Patient presents with   Cough    Cough, sob, and congestion, 1 x week    HISTORY OF PRESENT ILLNESS: Acute problem visit today. This is a 80 y.o. female complaining of flulike symptoms that started last Saturday, 4 days ago. Worst day was Monday.  Had low-grade fever.  Cough and congestion.  Nonproductive cough. Mild shortness of breath but mostly on exertion.  No chest pain. Had COVID test done at [facility] yesterday and it was negative. Did not get influenza vaccine. Had severe generalized body aches last Monday. Able to eat and drink.  No nausea or vomiting.  Denies abdominal pain or diarrhea.  Cough Pertinent negatives include no chest pain, chills, fever, headaches, sore throat or shortness of breath.    Prior to Admission medications   Medication Sig Start Date End Date Taking? Authorizing Provider  Accu-Chek Softclix Lancets lancets 1 each by Other route 3 (three) times daily. as directed 03/30/20  Yes Maximiano Coss, NP  allopurinol (ZYLOPRIM) 300 MG tablet TAKE 1 TABLET(300 MG) BY MOUTH DAILY 07/02/21  Yes Lamija Besse, Ines Bloomer, MD  amiodarone (PACERONE) 200 MG tablet Take 1 tablet (200 mg total) by mouth daily. 05/27/21  Yes [name], MD  blood glucose meter kit and supplies Dispense based on patient and insurance preference. Use up to four times daily as directed. (FOR ICD-10 E10.9, E11.9). 08/29/21  Yes Maddilyn Campus, Ines Bloomer, MD  cloNIDine (CATAPRES) 0.3 MG tablet Take 1 tablet (0.3 mg total) by mouth 2 (two) times daily. 07/02/21  Yes Bravlio Luca, Ines Bloomer, MD  Continuous Blood Gluc Receiver (FREESTYLE LIBRE 14 DAY READER) DEVI Use as directed. 08/29/21  Yes Payslee Bateson, Ines Bloomer, MD  Continuous Blood Gluc Sensor (FREESTYLE LIBRE 14 DAY SENSOR) MISC Use as directed to check blood sugar daily 08/29/21  Yes Estle Huguley, Ines Bloomer, MD  cyclobenzaprine (FLEXERIL) 5 MG tablet Take 1 tablet (5 mg total) by mouth as needed  for muscle spasms. 06/18/21  Yes Briget Shaheed, Ines Bloomer, MD  diltiazem (CARDIZEM) 30 MG tablet Take 1 tablet (30 mg total) by mouth See admin instructions. Take one tablet every 4 hours as needed for heart rate greater than 100 08/26/21  Yes Sherran Needs, NP  ELIQUIS 5 MG TABS tablet TAKE 1 TABLET(5 MG) BY MOUTH TWICE DAILY 06/17/21  Yes [name], MD  Insulin Lispro Prot & Lispro (HUMALOG MIX 75/25 KWIKPEN) (75-25) 100 UNIT/ML Kwikpen Inject 17 Units into the skin 2 (two) times daily. 05/24/21  Yes [name], Melanie Crazier, MD  levothyroxine (SYNTHROID) 75 MCG tablet Take 1 tablet (75 mcg total) by mouth daily before breakfast. 01/23/21 09/18/21 Yes Arriyah Madej, Ines Bloomer, MD  lidocaine (LIDODERM) 5 % Place 1 patch onto the skin daily. Remove & Discard patch within 12 hours or as directed by MD Patient taking differently: Place 1 patch onto the skin daily as needed (Pain). Remove & Discard patch within 12 hours or as directed by MD 08/29/20  Yes Mcarthur Rossetti, MD  linaclotide [facility]) 145 MCG CAPS capsule Take 1 capsule (145 mcg total) by mouth daily before breakfast. 08/29/21  Yes Veleka Djordjevic, Ines Bloomer, MD  loperamide (IMODIUM) 2 MG capsule Take 1 capsule (2 mg total) by mouth 4 (four) times daily as needed for diarrhea or loose stools. 03/20/20  Yes Garald Balding, PA-C  losartan (COZAAR) 100 MG tablet TAKE 1 TABLET BY MOUTH  DAILY 09/07/21  Yes Zakkary Thibault, Ines Bloomer, MD  Na  Sulfate-K Sulfate-Mg Sulf (SUPREP BOWEL PREP KIT) 17.5-3.13-1.6 GM/177ML SOLN Take 1 kit by mouth as directed. 06/12/21  Yes Milus Banister, MD  NOVOFINE PLUS PEN NEEDLE 32G X 4 MM MISC USE TWICE DAILY AS DIRECTED Patient taking differently: 1 each by Other route as directed. 04/08/21  Yes Briseida Gittings, Ines Bloomer, MD  Olopatadine HCl 0.2 % SOLN Place 1 drop into the right eye daily. 02/21/21  Yes [provider]  ONETOUCH VERIO test strip 1 each by Other route 3 (three) times daily. 06/08/20  Yes [provider]  potassium chloride (KLOR-CON) 10 MEQ tablet Take 2 tablets (20 mEq total) by mouth daily. 03/20/21  Yes Seiler, Amber K, NP  RESTASIS MULTIDOSE 0.05 % ophthalmic emulsion Place 1 drop into both eyes 2 (two) times daily.  03/04/20  Yes [provider]  rosuvastatin (CRESTOR) 10 MG tablet TAKE 1 TABLET BY MOUTH IN  THE EVENING Patient taking differently: Take 10 mg by mouth every evening. 04/25/21  Yes Clint Biello, Ines Bloomer, MD  Semaglutide, 1 MG/DOSE, (OZEMPIC, 1 MG/DOSE,) 4 MG/3ML SOPN Inject 1 mg into the skin once a week. 05/24/21  Yes [name], Melanie Crazier, MD  torsemide (DEMADEX) 20 MG tablet Take 1 tablet (20 mg total) by mouth daily. 05/30/21  Yes Sherran Needs, NP  triamcinolone cream (KENALOG) 0.1 % Apply 1 application topically 2 (two) times daily. Patient taking differently: Apply 1 application topically daily as needed (Rash under breast). 07/26/20  Yes Cheyeanne Roadcap, Ines Bloomer, MD  verapamil (CALAN) 120 MG tablet Take 1 tablet (120 mg total) by mouth in the morning. 08/16/21  Yes [name], MD    Allergies  Allergen Reactions   Doxycycline     Made patient feel very ill    Metformin Diarrhea    Patient Active Problem List   Diagnosis Date Noted   Bruising at injection site 08/03/2021   Abdominal pain 08/03/2021   CAP (community acquired pneumonia) 07/17/2021   Chills 07/17/2021   Visual hallucination 07/17/2021   Dysthymia 03/12/2021   Dysuria 03/01/2021   Primary osteoarthritis involving multiple joints 03/01/2021   Recurrent falls 01/10/2021   Sensorineural hearing loss (SNHL), 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   Persistent atrial fibrillation ([place]) 08/12/2020   Type 2 diabetes mellitus with hyperglycemia, with long-term current use of insulin ([place]) 06/21/2020   Type 2 diabetes mellitus with diabetic polyneuropathy, with long-term current use of insulin ([place]) 06/21/2020   Mixed hyperlipidemia 06/21/2020   Uncontrolled hypertension 06/11/2020   Aortic  atherosclerosis ([place]) 03/28/2020   Diabetic peripheral neuropathy ([place]) 12/26/2019   Gastroesophageal reflux disease without esophagitis 03/17/2019   Bilateral leg edema 02/16/2019   Pruritus 02/16/2019   Varicose veins of both legs with edema 02/16/2019   Heart murmur 09/05/2018   Restrictive lung disease 02/22/2018   Dysgeusia 06/11/2017   Osteoarthritis of left knee 06/03/2017   NAFLD (nonalcoholic fatty liver disease) 08/07/2016   Osteoporosis 12/31/2015   Obesity 12/19/2015   Hypersomnia 12/19/2015   Edema 10/18/2015   Acquired hypothyroidism 76/16/0737   Acute diastolic congestive heart failure, NYHA class 3 ([place]) 10/03/2013   Prolonged QT interval 10/62/6948   Acute diastolic heart failure ([place]) 10/03/2013   Long term (current) use of anticoagulants 06/09/2012   BENIGN NEOPLASM OF ADRENAL GLAND 11/25/2010   Chronic diastolic heart failure ([place]) 02/20/2010   DM 05/16/2009   GOUT 05/16/2009   OBESITY, MORBID 05/16/2009   Hypertension associated with diabetes ([place]) 05/16/2009   Paroxysmal atrial fibrillation ([place]) 05/16/2009   HYPERLIPIDEMIA  11/30/2008   Obstructive sleep apnea on CPAP 11/30/2008    Past Medical History:  Diagnosis Date   Arthritis    Back pain    Chronic anticoagulation    due to aflutter   Chronic kidney disease    Diabetes mellitus    Diastolic CHF, chronic ([place])    a.  echo 2006 - ef 55-65%; mild diast dysfxn;    b. Echo 08/2011: Mild LVH, EF 60%;  c. 04/2013 Echo: EF 65-69%, mild conc LVH;  08/2014 Echo: EF 60-65%, mild-mod MR.   Gout    Hyperlipidemia    Hypertension    a.  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    Past Surgical History:  Procedure Laterality Date   APPENDECTOMY     ATRIAL FLUTTER ABLATION N/A 09/24/2011   Procedure: ATRIAL FLUTTER ABLATION;  Surgeon: [name],  MD;  Location: [facility] CATH LAB;  Service: Cardiovascular;  Laterality: N/A;   CARDIOVERSION  10/22/2011   Procedure: CARDIOVERSION;  Surgeon: [name], MD;  Location: [place];  Service: Cardiovascular;  Laterality: N/A;   CARDIOVERSION N/A 09/10/2011   Procedure: CARDIOVERSION;  Surgeon: [name], MD;  Location: [facility] C[facility] LAB;  Service: Cardiovascular;  Laterality: N/A;   CHOLECYSTECTOMY     COLONOSCOPY WITH PROPOFOL N/A 06/13/2021   Procedure: COLONOSCOPY WITH PROPOFOL;  Surgeon: Milus Banister, MD;  Location: [facility] ENDOSCOPY;  Service: Endoscopy;  Laterality: N/A;   POLYPECTOMY  06/13/2021   Procedure: POLYPECTOMY;  Surgeon: Milus Banister, MD;  Location: [facility] ENDOSCOPY;  Service: Endoscopy;;   TONSILLECTOMY  1982   TOTAL ABDOMINAL HYSTERECTOMY      Social History   Socioeconomic History   Marital status: Married    Spouse name: Not on file   Number of children: 3   Years of education: Not on file   Highest education level: Not on file  Occupational History   Occupation: DISABILITY/housewife    Employer: RETIRED  Tobacco Use   Smoking status: Never   Smokeless tobacco: Never  Vaping Use   Vaping Use: Never used  Substance and Sexual Activity   Alcohol use: No   Drug use: No   Sexual activity: Yes  Other Topics Concern   Not on file  Social History Narrative   Not on file   Social Determinants of Health   Financial Resource Strain: Not on file  Food Insecurity: No Food Insecurity   Worried About Running Out of Food in the Last Year: Never true   Ran Out of Food in the Last Year: Never true  Transportation Needs: No Transportation Needs   Lack of Transportation (Medical): No   Lack of Transportation (Non-Medical): No  Physical Activity: Not on file  Stress: Not on file  Social Connections: Not on file  Intimate Partner Violence: Not on file    Family History  Problem Relation Age of Onset   Heart disease Father    Hypertension Father    Breast cancer Sister     Cancer Sister        breast   Colon cancer Neg Hx    Esophageal cancer Neg Hx    Pancreatic cancer Neg Hx    Liver disease Neg Hx      Review of Systems  Constitutional: Negative.  Negative for chills and  fever.  HENT:  Positive for congestion. Negative for sore throat.   Respiratory:  Positive for cough. Negative for sputum production and shortness of breath.   Cardiovascular: Negative.  Negative for chest pain and palpitations.  Gastrointestinal:  Negative for abdominal pain, diarrhea, nausea and vomiting.  Genitourinary: Negative.  Negative for dysuria and hematuria.  Skin: Negative.   Neurological: Negative.  Negative for dizziness and headaches.  All other systems reviewed and are negative.  Today's Vitals   09/18/21 1414  BP: 132/82  Pulse: 71  SpO2: 95%  Weight: 243 lb (110.2 kg)  Height: _0  (1.549 m)   Body mass index is 45.91 kg/m.  Physical Exam Vitals reviewed.  Constitutional:      Appearance: Normal appearance. She is obese.  Eyes:     Extraocular Movements: Extraocular movements intact.     Pupils: Pupils are equal, round, and reactive to light.  Cardiovascular:     Rate and Rhythm: Normal rate and regular rhythm.     Pulses: Normal pulses.     Heart sounds: Normal heart sounds.  Pulmonary:     Effort: Pulmonary effort is normal.     Breath sounds: Normal breath sounds.  Skin:    General: Skin is warm and dry.  Neurological:     General: No focal deficit present.     Mental Status: She is alert and oriented to person, place, and time.  Psychiatric:        Mood and Affect: Mood normal.        Behavior: Behavior normal.    Results for orders placed or performed in visit on 09/18/21 (from the past 24 hour(s))  POC Influenza A&B (Binax test)     Status: None   Collection Time: 09/18/21  4:52 PM  Result Value Ref Range   Influenza A, POC Negative Negative   Influenza B, POC Negative Negative   *Note: Due to a large number of results and/or  encounters for the requested time period, some results have not been displayed. A complete set of results can be found in Results Review.    ASSESSMENT & PLAN: Clinically stable.  No red flag signs or symptoms. Negative COVID test and negative influenza test. Viral respiratory infection running its course.  Today better than 48 hours ago. Still coughing.  We will treat symptomatically. Advised to contact the office if no better or worse during the next several days.  Problem List Items Addressed This Visit   None Visit Diagnoses     Flu-like symptoms    -  Primary   Relevant Orders   POC Influenza A&B (Binax test) (Completed)   Acute cough       Relevant Medications   HYDROcodone bit-homatropine (HYCODAN) 5-1.5 MG/5ML syrup   Viral respiratory infection          Patient Instructions  Take over-the-counter Mucinex DM every 4-6 hours as needed. Cough, Adult A cough helps to clear your throat and lungs. A cough may be a sign of an illness or another medical condition. An acute cough may only last 2-3 weeks, while a chronic cough may last 8 or more weeks. Many things can cause a cough. They include: Germs (viruses or bacteria) that attack the airway. Breathing in things that bother (irritate) your lungs. Allergies. Asthma. Mucus that runs down the back of your throat (postnasal drip). Smoking. Acid backing up from the stomach into the tube that moves food from the mouth to the stomach (gastroesophageal reflux). Some  medicines. Lung problems. Other medical conditions, such as heart failure or a blood clot in the lung (pulmonary embolism). Follow these instructions at home: Medicines Take over-the-counter and prescription medicines only as told by your doctor. Talk with your doctor before you take medicines that stop a cough (cough suppressants). Lifestyle  Do not smoke, and try not to be around smoke. Do not use any products that contain nicotine or tobacco, such as  cigarettes, e-cigarettes, and chewing tobacco. If you need help quitting, ask your doctor. Drink enough fluid to keep your pee (urine) pale yellow. Avoid caffeine. Do not drink alcohol if your doctor tells you not to drink. General instructions  Watch for any changes in your cough. Tell your doctor about them. Always cover your mouth when you cough. Stay away from things that make you cough, such as perfume, candles, campfire smoke, or cleaning products. If the air is dry, use a cool mist vaporizer or humidifier in your home. If your cough is worse at night, try using extra pillows to raise your head up higher while you sleep. Rest as needed. Keep all follow-up visits as told by your doctor. This is important. Contact a doctor if: You have new symptoms. You cough up pus. Your cough does not get better after 2-3 weeks, or your cough gets worse. Cough medicine does not help your cough and you are not sleeping well. You have pain that gets worse or pain that is not helped with medicine. You have a fever. You are losing weight and you do not know why. You have night sweats. Get help right away if: You cough up blood. You have trouble breathing. Your heartbeat is very fast. These symptoms may be an emergency. Do not wait to see if the symptoms will go away. Get medical help right away. Call your local emergency services (911 in the [place] Do not drive yourself to the hospital. Summary A cough helps to clear your throat and lungs. Many things can cause a cough. Take over-the-counter and prescription medicines only as told by your doctor. Always cover your mouth when you cough. Contact a doctor if you have new symptoms or you have a cough that does not get better or gets worse. This information is not intended to replace advice given to you by your health care provider. Make sure you discuss any questions you have with your health care provider. Document Revised: 11/25/2019 Document  Reviewed: 10/25/2018 Elsevier Patient Education  2022 [place], MD [facility]

## 2021-09-18 NOTE — Patient Instructions (Signed)
Take over-the-counter Mucinex DM every 4-6 hours as needed. Cough, Adult A cough helps to clear your throat and lungs. A cough may be a sign of an illness or another medical condition. An acute cough may only last 2-3 weeks, while a chronic cough may last 8 or more weeks. Many things can cause a cough. They include: Germs (viruses or bacteria) that attack the airway. Breathing in things that bother (irritate) your lungs. Allergies. Asthma. Mucus that runs down the back of your throat (postnasal drip). Smoking. Acid backing up from the stomach into the tube that moves food from the mouth to the stomach (gastroesophageal reflux). Some medicines. Lung problems. Other medical conditions, such as heart failure or a blood clot in the lung (pulmonary embolism). Follow these instructions at home: Medicines Take over-the-counter and prescription medicines only as told by your doctor. Talk with your doctor before you take medicines that stop a cough (cough suppressants). Lifestyle  Do not smoke, and try not to be around smoke. Do not use any products that contain nicotine or tobacco, such as cigarettes, e-cigarettes, and chewing tobacco. If you need help quitting, ask your doctor. Drink enough fluid to keep your pee (urine) pale yellow. Avoid caffeine. Do not drink alcohol if your doctor tells you not to drink. General instructions  Watch for any changes in your cough. Tell your doctor about them. Always cover your mouth when you cough. Stay away from things that make you cough, such as perfume, candles, campfire smoke, or cleaning products. If the air is dry, use a cool mist vaporizer or humidifier in your home. If your cough is worse at night, try using extra pillows to raise your head up higher while you sleep. Rest as needed. Keep all follow-up visits as told by your doctor. This is important. Contact a doctor if: You have new symptoms. You cough up pus. Your cough does not get better  after 2-3 weeks, or your cough gets worse. Cough medicine does not help your cough and you are not sleeping well. You have pain that gets worse or pain that is not helped with medicine. You have a fever. You are losing weight and you do not know why. You have night sweats. Get help right away if: You cough up blood. You have trouble breathing. Your heartbeat is very fast. These symptoms may be an emergency. Do not wait to see if the symptoms will go away. Get medical help right away. Call your local emergency services (911 in the [place] Do not drive yourself to the hospital. Summary A cough helps to clear your throat and lungs. Many things can cause a cough. Take over-the-counter and prescription medicines only as told by your doctor. Always cover your mouth when you cough. Contact a doctor if you have new symptoms or you have a cough that does not get better or gets worse. This information is not intended to replace advice given to you by your health care provider. Make sure you discuss any questions you have with your health care provider. Document Revised: 11/25/2019 Document Reviewed: 10/25/2018 Elsevier Patient Education  [place].

## 2021-09-20 IMAGING — MG MM DIGITAL SCREENING BILAT W/ TOMO AND CAD
6 of 10 series · 6 of 30 positions shown · non-contrast
Comparison: Previous exam(s).

CLINICAL DATA: Screening.

EXAM:
DIGITAL SCREENING BILATERAL MAMMOGRAM WITH TOMOSYNTHESIS AND CAD
TECHNIQUE: Bilateral screening digital craniocaudal and mediolateral oblique
mammograms were obtained. Bilateral screening digital breast
tomosynthesis was performed. The images were evaluated with
computer-aided detection.

[R MLO synth-2D]
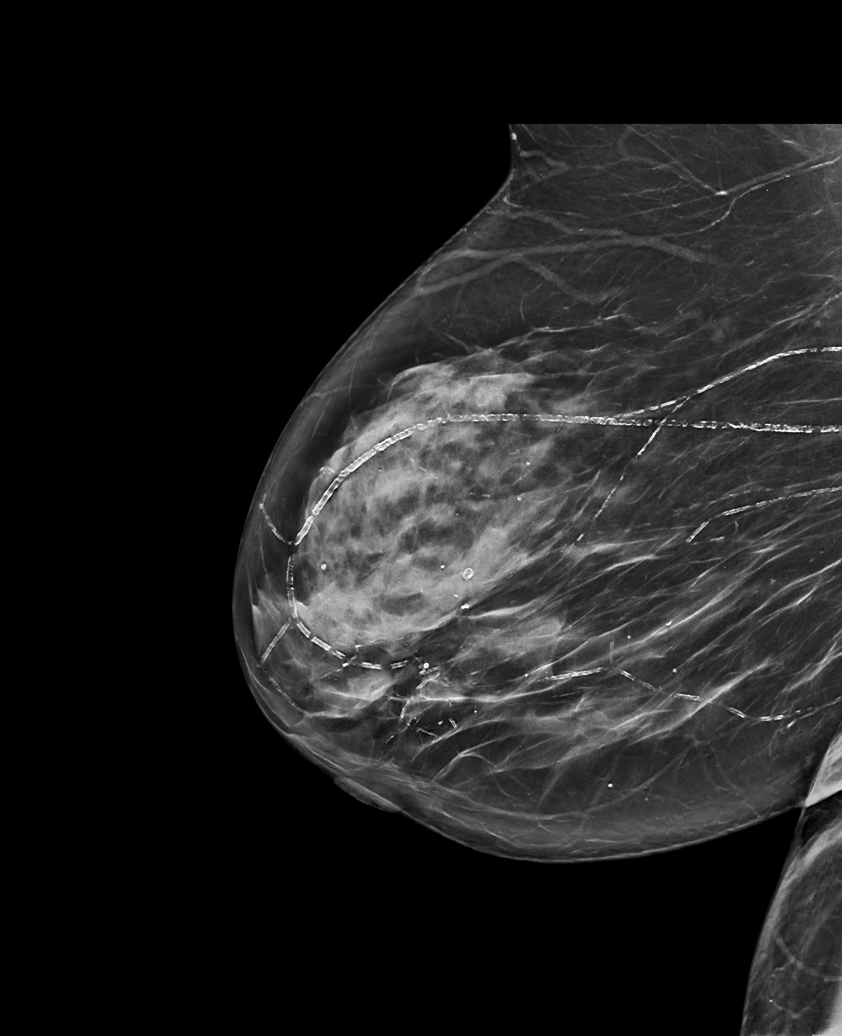

[L CC synth-2D (1 of 2)]
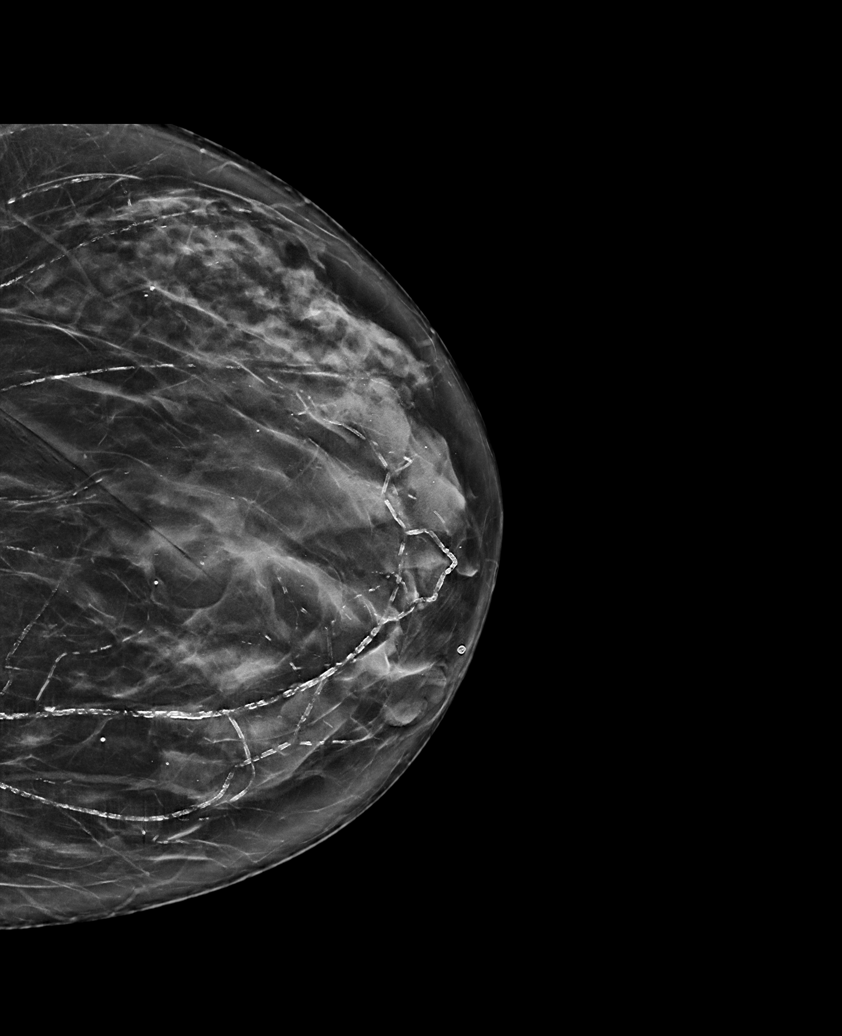

[R CC synth-2D]
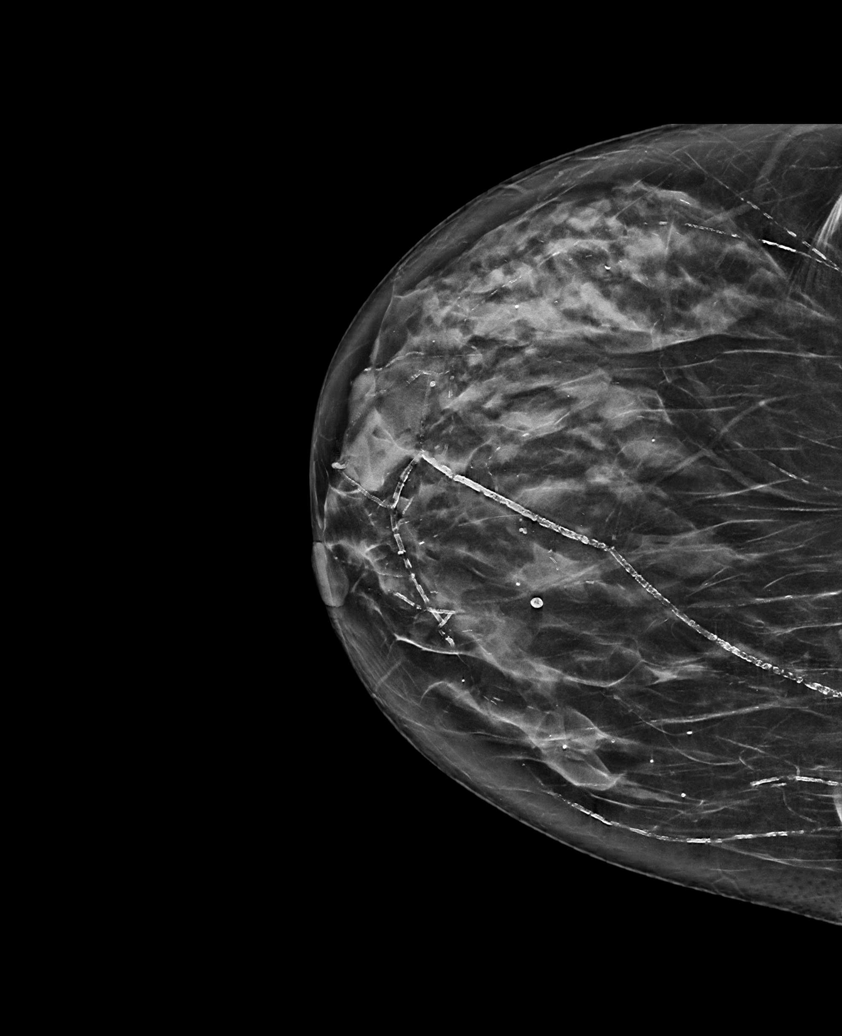

[L CC synth-2D (2 of 2)]
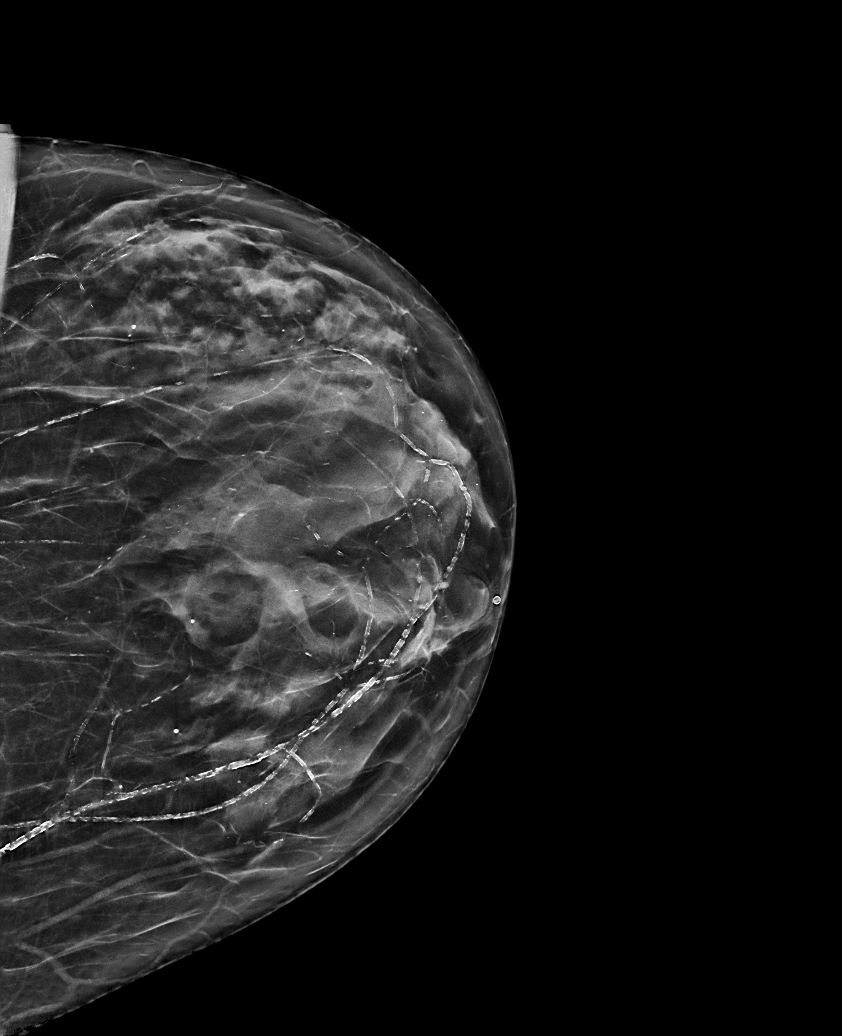

[L MLO synth-2D]
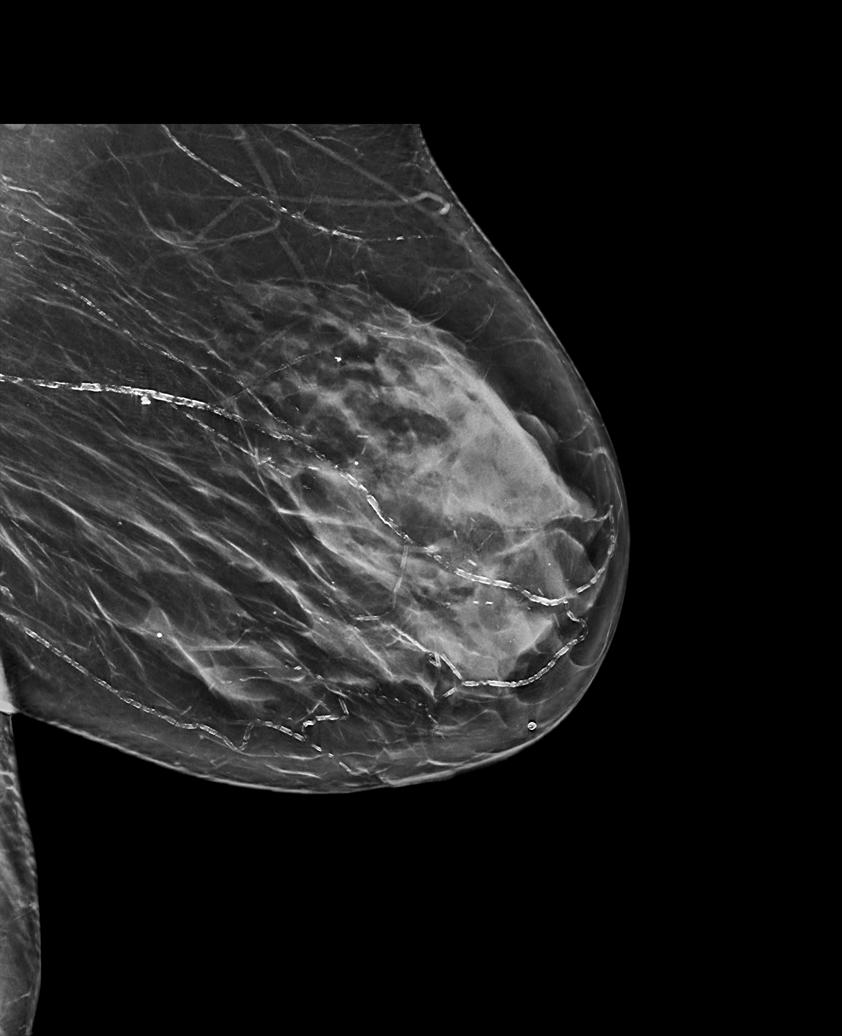

[L CC tomo · tomo slice 35/69.0]
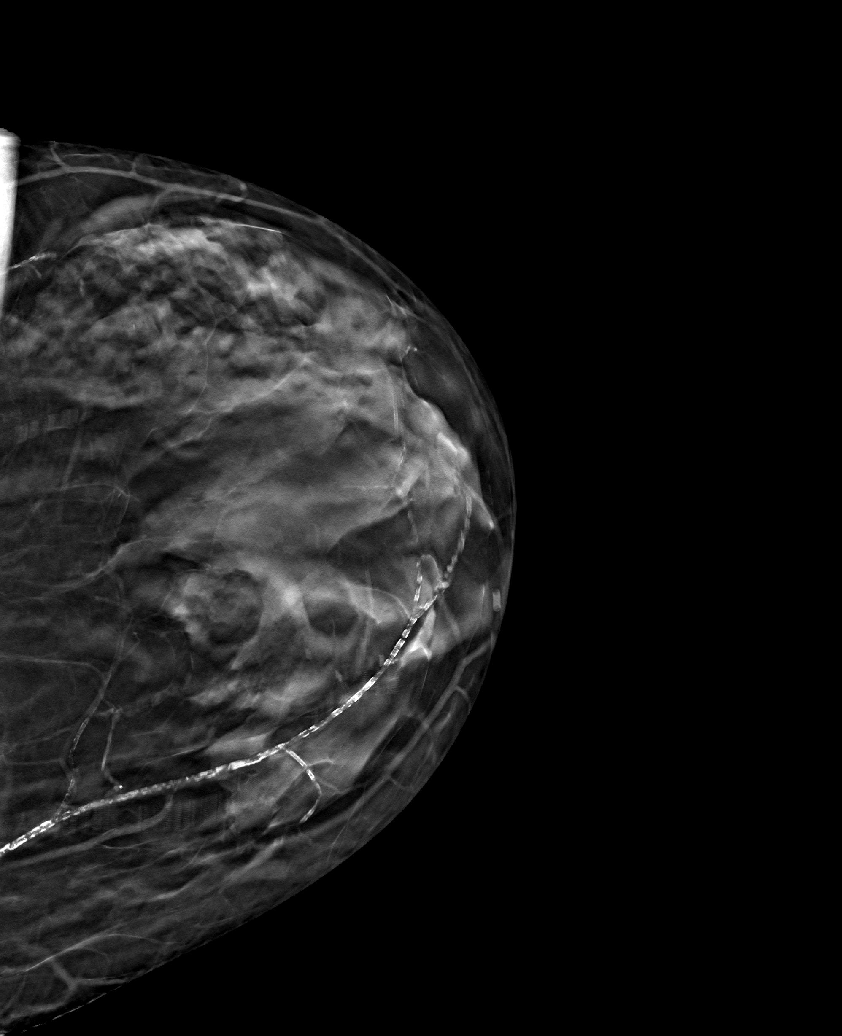

[6 of 30 positions shown; findings below may reference images not displayed]

ACR Breast Density Category c: The breast tissue is heterogeneously
dense, which may obscure small masses.
FINDINGS: There are no findings suspicious for malignancy. The images were
evaluated with computer-aided detection.
IMPRESSION: No mammographic evidence of malignancy. A result letter of this
screening mammogram will be mailed directly to the patient.

RECOMMENDATION:
Screening mammogram in one year. (Code:T4-5-GWO)

BI-RADS CATEGORY  1: Negative.

## 2021-09-22 ENCOUNTER — Other Ambulatory Visit: Payer: Self-pay | Admitting: Emergency Medicine

## 2021-09-24 ENCOUNTER — Telehealth: Payer: Self-pay | Admitting: Internal Medicine

## 2021-09-25 ENCOUNTER — Ambulatory Visit: Payer: Medicare Other | Admitting: Internal Medicine

## 2021-09-25 ENCOUNTER — Telehealth: Payer: Self-pay | Admitting: Podiatry

## 2021-09-25 NOTE — Telephone Encounter (Signed)
Pt called stating she came in for shoes but they did not fit and she was told we would call to get her scheduled and she did not hear anything but now got a bill for the shoes.  Upon checking I left message for pt to call to schedule and appt on 10.14. She does not remember a message but I have scheduled her to come in to pick out a new shoe.

## 2021-09-25 NOTE — Telephone Encounter (Signed)
I called and spoke with the pt  She states her cough is still bothering her since July 2022 when she had some dental work done  She is not using her CPAP bc of this  She states cough is worse at night and is non prod  Sheis not getting any sleep and neither is her spouse  She is using hycodan her PCP gave her, and it makes cough worse for a short time and then eventually helps some  She states she blows her nose a lot and has some PND- clear nasal drainage  She is not taking any sort on antihistamine  Please advise thanks!  She states up to date on flu and covid vaccines  Allergies  Allergen Reactions   Doxycycline     Made patient feel very ill    Metformin Diarrhea

## 2021-09-25 NOTE — Telephone Encounter (Signed)
I called and spoke with the pt  I have scheduled her to see Roxan Diesel, NP 09/27/21 at noon

## 2021-09-25 NOTE — Telephone Encounter (Signed)
I don't think we should just keep using cough medicines- this isn't fixing the problem.  Please get her next available with me or an APP for problem cough.

## 2021-09-25 NOTE — Progress Notes (Deleted)
Name: [name]  Age/ Sex: 80 y.o., female   MRN/ DOB: [id number], 03-04-1941     PCP: Georgina Quint, MD   Reason for Endocrinology Evaluation: Type 2 Diabetes Mellitus  Initial Endocrine Consultative Visit: 06/21/2020    PATIENT IDENTIFIER: [name] is a 80 y.o. female with a past medical history of HTN, PAF, CHF, OSA and Dyslipidemia . The patient has followed with Endocrinology clinic since 06/21/2020 for consultative assistance with management of her diabetes.  DIABETIC HISTORY:  [name] was diagnosed with DM in 2011,she is intolerant to metformin due to diarrhea. Her hemoglobin A1c has ranged from 7.3% in 2011, peaking at 9.5% in 2014.     Due to hypokalemia and cushingoid features we proceeded with salivary cortisol (  1st sample 0.73 ug/dL 2nd sample 8.41 ug/dL   Which is normal  SUBJECTIVE:   During the last visit (01/10/2021): A1c 7.4 %, adjusted insulin and restarted Ozempic      Today (09/25/2021): [name]  She checks her blood sugars multiple times a day  times daily, through CGM . The patient has not had hypoglycemic episodes since the last clinic visit.   Denies nausea or diarrhea Had recent fall   She has been having dental procedures with teeth pain  Son had a stoke, has been stressed  She has not been taking Ozempic - unclear reason   She is not on steroids   HOME DIABETES REGIMEN:  Humalog Mix 15 units with Breakfast and Supper  Ozempic 1 mg weekly       Statin: yes ACE-I/ARB: yes   METER DOWNLOAD SUMMARY: 7/22-05/24/2021  Average Number Tests/Day = 0.5 Overall Mean FS Glucose = 227 Standard Deviation = 44  BG Ranges: Low = 170 High = 290    Hypoglycemic Events/30 Days: BG < 50 = 0 Episodes of symptomatic severe hypoglycemia = 0         DIABETIC COMPLICATIONS: Microvascular complications:  Neuropathy Denies: CKD, , retinopathy Last eye exam: Completed 03/2020   Macrovascular complications:     Denies: CAD, PVD, CVA    HISTORY:  Past Medical History:  Past Medical History:  Diagnosis Date   Arthritis    Back pain    Chronic anticoagulation    due to aflutter   Chronic kidney disease    Diabetes mellitus    Diastolic CHF, chronic (HCC)    a.  echo 2006 - ef 55-65%; mild diast dysfxn;    b. Echo 08/2011: Mild LVH, EF 60%;  c. 04/2013 Echo: EF 65-69%, mild conc LVH;  08/2014 Echo: EF 60-65%, mild-mod MR.   Gout    Hyperlipidemia    Hypertension    a.  Renal arterial Dopplers 12/2011: 1-59% right renal artery stenosis   Morbid obesity (HCC)    Obstructive sleep apnea on CPAP    Paroxysmal Afib/Flutter    a. 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   Past Surgical History:  Past Surgical History:  Procedure Laterality Date   APPENDECTOMY     ATRIAL FLUTTER ABLATION N/A 09/24/2011   Procedure: ATRIAL FLUTTER ABLATION;  Surgeon: Marinus Maw, MD;  Location: [facility] CATH LAB;  Service: Cardiovascular;  Laterality: N/A;   CARDIOVERSION  10/22/2011   Procedure: CARDIOVERSION;  Surgeon: Duke Salvia, MD;  Location: [facility] OR;  Service: Cardiovascular;  Laterality: N/A;   CARDIOVERSION N/A 09/10/2011   Procedure: CARDIOVERSION;  Surgeon: Duke Salvia, MD;  Location: [facility] CATH LAB;  Service: Cardiovascular;  Laterality: N/A;  CHOLECYSTECTOMY     COLONOSCOPY WITH PROPOFOL N/A 06/13/2021   Procedure: COLONOSCOPY WITH PROPOFOL;  Surgeon: Rachael Fee, MD;  Location: [facility] ENDOSCOPY;  Service: Endoscopy;  Laterality: N/A;   POLYPECTOMY  06/13/2021   Procedure: POLYPECTOMY;  Surgeon: Rachael Fee, MD;  Location: [facility] ENDOSCOPY;  Service: Endoscopy;;   TONSILLECTOMY  1982   TOTAL ABDOMINAL HYSTERECTOMY     Social History:  reports that she has never smoked. She has never used smokeless tobacco. She reports that she does not drink alcohol and does not use drugs. Family History:  Family History  Problem Relation Age of Onset   Heart disease Father    Hypertension Father    Breast  cancer Sister    Cancer Sister        breast   Colon cancer Neg Hx    Esophageal cancer Neg Hx    Pancreatic cancer Neg Hx    Liver disease Neg Hx      HOME MEDICATIONS: Allergies as of 09/25/2021       Reactions   Doxycycline    Made patient feel very ill    Metformin Diarrhea        Medication List        Accurate as of September 25, 2021  7:27 AM. If you have any questions, ask your nurse or doctor.          Accu-Chek Softclix Lancets lancets 1 each by Other route 3 (three) times daily. as directed   allopurinol 300 MG tablet Commonly known as: ZYLOPRIM TAKE 1 TABLET(300 MG) BY MOUTH DAILY   amiodarone 200 MG tablet Commonly known as: PACERONE Take 1 tablet (200 mg total) by mouth daily.   blood glucose meter kit and supplies Dispense based on patient and insurance preference. Use up to four times daily as directed. (FOR ICD-10 E10.9, E11.9).   cloNIDine 0.3 MG tablet Commonly known as: CATAPRES Take 1 tablet (0.3 mg total) by mouth 2 (two) times daily.   cyclobenzaprine 5 MG tablet Commonly known as: FLEXERIL Take 1 tablet (5 mg total) by mouth as needed for muscle spasms.   diltiazem 30 MG tablet Commonly known as: CARDIZEM Take 1 tablet (30 mg total) by mouth See admin instructions. Take one tablet every 4 hours as needed for heart rate greater than 100   Eliquis 5 MG Tabs tablet Generic drug: apixaban TAKE 1 TABLET(5 MG) BY MOUTH TWICE DAILY   FreeStyle Libre 14 Day Reader Hardie Pulley Use as directed.   FreeStyle Libre 14 Day Sensor Misc Use as directed to check blood sugar daily   HYDROcodone bit-homatropine 5-1.5 MG/5ML syrup Commonly known as: HYCODAN Take 5 mLs by mouth at bedtime as needed for cough.   Insulin Lispro Prot & Lispro (75-25) 100 UNIT/ML Kwikpen Commonly known as: HumaLOG Mix 75/25 KwikPen Inject 17 Units into the skin 2 (two) times daily.   levothyroxine 75 MCG tablet Commonly known as: SYNTHROID Take 1 tablet (75 mcg total)  by mouth daily before breakfast.   lidocaine 5 % Commonly known as: Lidoderm Place 1 patch onto the skin daily. Remove & Discard patch within 12 hours or as directed by MD What changed:  when to take this reasons to take this   linaclotide 145 MCG Caps capsule Commonly known as: Linzess Take 1 capsule (145 mcg total) by mouth daily before breakfast.   loperamide 2 MG capsule Commonly known as: IMODIUM Take 1 capsule (2 mg total) by mouth 4 (four) times daily  as needed for diarrhea or loose stools.   losartan 100 MG tablet Commonly known as: COZAAR TAKE 1 TABLET BY MOUTH  DAILY   Na Sulfate-K Sulfate-Mg Sulf 17.5-3.13-1.6 GM/177ML Soln Commonly known as: Suprep Bowel Prep Kit Take 1 kit by mouth as directed.   NovoFine Plus Pen Needle 32G X 4 MM Misc Generic drug: Insulin Pen Needle USE TWICE DAILY AS DIRECTED What changed:  how much to take how to take this when to take this additional instructions   Olopatadine HCl 0.2 % Soln Place 1 drop into the right eye daily.   OneTouch Verio test strip Generic drug: glucose blood 1 each by Other route 3 (three) times daily.   Ozempic (1 MG/DOSE) 4 MG/3ML Sopn Generic drug: Semaglutide (1 MG/DOSE) Inject 1 mg into the skin once a week.   potassium chloride 10 MEQ tablet Commonly known as: KLOR-CON Take 2 tablets (20 mEq total) by mouth daily.   Restasis MultiDose 0.05 % ophthalmic emulsion Generic drug: cycloSPORINE Place 1 drop into both eyes 2 (two) times daily.   rosuvastatin 10 MG tablet Commonly known as: CRESTOR TAKE 1 TABLET BY MOUTH IN  THE EVENING   torsemide 20 MG tablet Commonly known as: DEMADEX Take 1 tablet (20 mg total) by mouth daily.   triamcinolone cream 0.1 % Commonly known as: KENALOG Apply 1 application topically 2 (two) times daily. What changed:  when to take this reasons to take this   verapamil 120 MG tablet Commonly known as: Calan Take 1 tablet (120 mg total) by mouth in the  morning.         OBJECTIVE:   Vital Signs: There were no vitals taken for this visit.  Wt Readings from Last 3 Encounters:  09/18/21 243 lb (110.2 kg)  08/29/21 248 lb (112.5 kg)  08/23/21 246 lb 2 oz (111.6 kg)     Exam: General: Pt appears well and is in NAD  Lungs: Clear with good BS bilat with no rales, rhonchi, or wheezes  Heart: RRR   Extremities: No pretibial edema.   Neuro: MS is good with appropriate affect, pt is alert and Ox3    DM foot exam: 9/2/221     The skin of the feet is intact without sores or ulcerations. The pedal pulses are 2+ on right and 2+ on left. The sensation is intact to a screening 5.07, 10 gram monofilament bilaterally         DATA REVIEWED:  Lab Results  Component Value Date   HGBA1C 8.6 (H) 08/29/2021   HGBA1C 7.8 (A) 05/24/2021   HGBA1C 7.4 (A) 11/29/2020    Latest Reference Range & Units 08/23/21 09:32  Sodium 135 - 145 mEq/L 140  Potassium 3.5 - 5.1 mEq/L 3.4 (L)  Chloride 96 - 112 mEq/L 98  CO2 19 - 32 mEq/L 35 (H)  Glucose 70 - 99 mg/dL 098 (H)  BUN 6 - 23 mg/dL 12  Creatinine 1.19 - 1.47 mg/dL 8.29  Calcium 8.4 - 56.2 mg/dL 9.1  Alkaline Phosphatase 39 - 117 U/L 126 (H)  Albumin 3.5 - 5.2 g/dL 4.0  Lipase 13.0 - 86.5 U/L 17.0  AST 0 - 37 U/L 23  ALT 0 - 35 U/L 20  Total Protein 6.0 - 8.3 g/dL 7.2  Total Bilirubin 0.2 - 1.2 mg/dL 0.6  GFR >78.46 mL/min 75.34    Latest Reference Range & Units 08/29/21 12:11  Total CHOL/HDL Ratio  2  Cholesterol 0 - 200 mg/dL 962  HDL  Cholesterol >39.00 mg/dL 22.02  LDL (calc) 0 - 99 mg/dL 51  NonHDL  54.27  Triglycerides 0.0 - 149.0 mg/dL 062.3  VLDL 0.0 - 76.2 mg/dL 83.1      ASSESSMENT / PLAN / RECOMMENDATIONS:   1) Type 2 Diabetes Mellitus, Poorly  controlled, With Neuropathic  complications - Most recent A1c of 8.6 %. Goal A1c < 7.0 %.    -Patient continues with worsening hyperglycemia, this is because she is not on the Ozempic, she really could not give me a clear  reason why she has not taken her Ozempic in the past 5 months. -Her PCP increase Ozempic to 1 mg, I have encouraged the patient to start taking Ozempic weekly -I am also going to increase her Humalog mix  - Intolerant to Metformin    MEDICATIONS: -Increase Humalog Mix 17 units with Breakfast and 17 units with Supper  -Start Ozempic 1 mg weekly    EDUCATION / INSTRUCTIONS: BG monitoring instructions: Patient is instructed to check her blood sugars 3 times a day, before meals . Call [place] [facility] if: BG persistently < 70 I reviewed the Rule of 15 for the treatment of hypoglycemia in detail with the patient. Literature supplied.    2) Diabetic complications:  Eye: Does not have known diabetic retinopathy.  Neuro/ Feet: Does have known diabetic peripheral neuropathy .  Renal: Patient does not have known baseline CKD. She   is on an ACEI/ARB at present.  3) Weakness/Hypokalemia   -Patient has cushingoid features, she is not on any intra-articular injections or oral glucocorticoids -She has been noted with hypokalemia which most likely is related to diuretic use -We will proceed with saliva cortisol -She is unable to do 24-hour urine collection due to incontinence issues     F/U in 4 months    Signed electronically by: Lyndle Herrlich, MD  [facility] Endocrinology  [facility] [address] [place] [place], [place] [address] Phone: [phone] FAX: [phone]   CC: Georgina Quint, MD [address] [place] [address] Phone: [phone]  Fax: [phone]  Return to Endocrinology clinic as below: Future Appointments  Date Time Provider Department Center  09/25/2021 10:30 AM Torri Michalski, Konrad Dolores, MD [facility] None  10/07/2021  9:30 AM Jetty Duhamel D, MD [facility] None  10/08/2021  9:45 AM Freddie Breech, DPM [facility]  10/09/2021  3:30 PM [facility] None  11/18/2021  3:30  PM [facility] CCM PHARMACIST 2 [facility] None  12/13/2021 11:30 AM Waymon Budge, MD [facility] None  01/02/2022  1:30 PM Waymon Budge, MD [facility] None

## 2021-09-27 ENCOUNTER — Other Ambulatory Visit (INDEPENDENT_AMBULATORY_CARE_PROVIDER_SITE_OTHER): Payer: Medicare Other

## 2021-09-27 ENCOUNTER — Encounter: Payer: Self-pay | Admitting: Nurse Practitioner

## 2021-09-27 ENCOUNTER — Other Ambulatory Visit: Payer: Self-pay

## 2021-09-27 ENCOUNTER — Ambulatory Visit (INDEPENDENT_AMBULATORY_CARE_PROVIDER_SITE_OTHER): Payer: Medicare Other

## 2021-09-27 ENCOUNTER — Ambulatory Visit (INDEPENDENT_AMBULATORY_CARE_PROVIDER_SITE_OTHER): Payer: Medicare Other | Admitting: Nurse Practitioner

## 2021-09-27 ENCOUNTER — Telehealth: Payer: Self-pay | Admitting: Internal Medicine

## 2021-09-27 ENCOUNTER — Ambulatory Visit: Payer: Medicare Other

## 2021-09-27 ENCOUNTER — Telehealth: Payer: Self-pay | Admitting: Nurse Practitioner

## 2021-09-27 VITALS — BP 124/76 | HR 70 | Temp 98.1°F | Ht 61.0 in | Wt 241.0 lb

## 2021-09-27 DIAGNOSIS — J441 Chronic obstructive pulmonary disease with (acute) exacerbation: Secondary | ICD-10-CM | POA: Diagnosis not present

## 2021-09-27 DIAGNOSIS — Z9989 Dependence on other enabling machines and devices: Secondary | ICD-10-CM | POA: Diagnosis not present

## 2021-09-27 DIAGNOSIS — J45901 Unspecified asthma with (acute) exacerbation: Secondary | ICD-10-CM | POA: Diagnosis not present

## 2021-09-27 DIAGNOSIS — R059 Cough, unspecified: Secondary | ICD-10-CM | POA: Insufficient documentation

## 2021-09-27 DIAGNOSIS — J811 Chronic pulmonary edema: Secondary | ICD-10-CM | POA: Insufficient documentation

## 2021-09-27 DIAGNOSIS — J4540 Moderate persistent asthma, uncomplicated: Secondary | ICD-10-CM | POA: Diagnosis not present

## 2021-09-27 DIAGNOSIS — J189 Pneumonia, unspecified organism: Secondary | ICD-10-CM

## 2021-09-27 DIAGNOSIS — R051 Acute cough: Secondary | ICD-10-CM | POA: Insufficient documentation

## 2021-09-27 DIAGNOSIS — J449 Chronic obstructive pulmonary disease, unspecified: Secondary | ICD-10-CM

## 2021-09-27 DIAGNOSIS — I4819 Other persistent atrial fibrillation: Secondary | ICD-10-CM

## 2021-09-27 DIAGNOSIS — I700 Atherosclerosis of aorta: Secondary | ICD-10-CM | POA: Diagnosis not present

## 2021-09-27 DIAGNOSIS — J810 Acute pulmonary edema: Secondary | ICD-10-CM | POA: Diagnosis not present

## 2021-09-27 DIAGNOSIS — R0602 Shortness of breath: Secondary | ICD-10-CM

## 2021-09-27 DIAGNOSIS — G4733 Obstructive sleep apnea (adult) (pediatric): Secondary | ICD-10-CM

## 2021-09-27 DIAGNOSIS — J4541 Moderate persistent asthma with (acute) exacerbation: Secondary | ICD-10-CM

## 2021-09-27 DIAGNOSIS — I5032 Chronic diastolic (congestive) heart failure: Secondary | ICD-10-CM | POA: Diagnosis not present

## 2021-09-27 DIAGNOSIS — R911 Solitary pulmonary nodule: Secondary | ICD-10-CM | POA: Diagnosis not present

## 2021-09-27 DIAGNOSIS — R053 Chronic cough: Secondary | ICD-10-CM | POA: Insufficient documentation

## 2021-09-27 DIAGNOSIS — I517 Cardiomegaly: Secondary | ICD-10-CM | POA: Diagnosis not present

## 2021-09-27 DIAGNOSIS — J90 Pleural effusion, not elsewhere classified: Secondary | ICD-10-CM | POA: Diagnosis not present

## 2021-09-27 LAB — BASIC METABOLIC PANEL
BUN: 12 mg/dL (ref 6–23)
CO2: 33 mEq/L — ABNORMAL HIGH (ref 19–32)
Calcium: 9.2 mg/dL (ref 8.4–10.5)
Chloride: 98 mEq/L (ref 96–112)
Creatinine, Ser: 0.63 mg/dL (ref 0.40–1.20)
GFR: 85.24 mL/min (ref >60.00)
Glucose, Bld: 171 mg/dL — ABNORMAL HIGH (ref 70–99)
Potassium: 3.5 mEq/L (ref 3.5–5.1)
Sodium: 140 mEq/L (ref 135–145)

## 2021-09-27 LAB — SEDIMENTATION RATE: Sed Rate: 85 mm/hr — ABNORMAL HIGH (ref 0–30)

## 2021-09-27 LAB — BRAIN NATRIURETIC PEPTIDE: Pro B Natriuretic peptide (BNP): 226 pg/mL — ABNORMAL HIGH (ref 0.0–100.0)

## 2021-09-27 MED ORDER — METHYLPREDNISOLONE ACETATE 80 MG/ML IJ SUSP
80.0000 mg | Freq: Once | INTRAMUSCULAR | Status: AC
  Administered 2021-09-27: 80 mg via INTRAMUSCULAR

## 2021-09-27 MED ORDER — BREZTRI AEROSPHERE 160-9-4.8 MCG/ACT IN AERO: 2.0000 | INHALATION_SPRAY | Freq: Two times a day (BID) | RESPIRATORY_TRACT | 0 refills | Status: DC

## 2021-09-27 MED ORDER — AMOXICILLIN-POT CLAVULANATE 875-125 MG PO TABS: 1.0000 | ORAL_TABLET | Freq: Two times a day (BID) | ORAL | 0 refills | Status: DC

## 2021-09-27 MED ORDER — PREDNISONE 20 MG PO TABS: 20.0000 mg | ORAL_TABLET | Freq: Every day | ORAL | 0 refills | Status: AC

## 2021-09-27 MED ORDER — BREZTRI AEROSPHERE 160-9-4.8 MCG/ACT IN AERO: 2.0000 | INHALATION_SPRAY | Freq: Two times a day (BID) | RESPIRATORY_TRACT | 3 refills | Status: DC

## 2021-09-27 MED ORDER — PREDNISONE 20 MG PO TABS: 20.0000 mg | ORAL_TABLET | Freq: Every day | ORAL | 0 refills | Status: DC

## 2021-09-27 NOTE — Assessment & Plan Note (Signed)
Appearance of pulmonary edema on CXR. CT for further evaluation. Notified cardiology and requested visit for pt. See above plan.

## 2021-09-27 NOTE — Assessment & Plan Note (Signed)
Pulmonary vs cardiac etiology. See above plan. Strict ED precautions with close follow up.

## 2021-09-27 NOTE — Telephone Encounter (Signed)
Per Lindaann Pascal is already aware of the results of pt's CT. Will close encounter.

## 2021-09-27 NOTE — Progress Notes (Addendum)
_0  ID: [name], female    DOB: August 17, 1941, 80 y.o.   MRN: [id number]  Chief Complaint  Patient presents with   Acute Visit    SOB increased and cough     Referring provider: Horald Pollen, *  HPI: 80 year old female, never smoker followed for obstructive sleep apnea, COPD/asthma, restrictive lung disease.  She is a patient of Dr. Janee Morn and was last seen in office on 08/12/2021.  Past medical history significant for CHF, CAD, hypertension, A. fib on amiodarone and Eliquis, GERD, nonalcoholic fatty liver, hypothyroid, DM2 with neuropathy, gout, morbid obesity.  TEST/EVENTS:  06/05/2011 NPSG: AHI 20.1/h, SPO2 low 87%, CPAP 10 03/25/2017 PFTs: Moderately severe obstruction, insignificant response to BD, normal TLC, DLCO mildly reduced 06/18/2021 echocardiogram: LVEF 65 to 70%.  Mild LVH.  Grade 1 DD.  Elevated left atrial pressure.  RV size and function normal.  Left atrial size mild to moderately dilated.  Mild MV regurg.  AV is thickened, calcified with mildly restricted motion.  Mild AS 08/12/2021: Increasing opacity at the right lung base compared to previous imaging. 08/29/2021 CXR 2 view: Slight decrease in previously noted right lung base opacity, possibly resolving pneumonia.  08/12/2021: OV with Dr. Annamaria Boots.  New CPAP ordered 01/03/2021; however, lapsed and reordered at this visit.  Patient reported she had previously stopped using her CPAP after oral surgery but informed that she had restarted therapy. Encouraged nightly compliance. Advised on otc tx for constipation.   09/27/2021: Today - acute visit Patient presents today in a wheelchair for reported increased shortness of breath and productive cough. Her symptoms have been ongoing, off and on, since September. She was treated for multilobular pneumonia by her PCP with levaquin in September for suspected multilobar pneumonia by her PCP. She has seen her PCP multiple times since, as recently as 11/30 who prescribed  hycodan syrup for cough which has provided minimal relief. Over the past week, her symptoms have worsened. She reports her shortness of breath is primarily upon exertion, but she does occasionally experience it at rest. She has a productive cough with white, thick sputum production. She reports orthopnea and PND at night. She denied recent fevers, although the note from her PCP on 11/30 stated she had a low-grade fever. She has noticed some intermittent wheezing and occasional nasal congestion with rhinorrhea (clear). She denies chest pain/tightness, palpitations, or increased lower extremity swelling. She denies any recent sick exposures. She is not on any maintenance inhalers and does not have a rescue inhaler. She continues on chronic anticoagulation therapy with Eliquis and denies hemoptysis, hematochezia, hematuria, or excessive bleeding/bruising. She is currently on amiodarone 200 mg daily for a fib and takes torsemide 20 mg daily, prescribed by cardiology.  Upon arrival to our waiting room today, she began having difficulties breathing. Her symptoms resolved, without intervention, and she reported feeling better when evaluated in the exam room. She stated she believed it was related to the distance she had to travel from her car into the office and some anxiety. Overall, she feels ok but is concerned about her breathing.   Allergies  Allergen Reactions   Doxycycline     Made patient feel very ill    Metformin Diarrhea    Immunization History  Administered Date(s) Administered   Fluad Quad(high Dose 65+) 12/31/2015   Influenza Split 09/20/2011, 07/13/2012   Influenza, High Dose Seasonal PF 08/07/2016, 08/04/2017, 07/21/2018, 07/22/2019   Influenza,inj,Quad PF,6+ Mos 10/04/2013   Influenza-Unspecified 09/20/2011, 07/13/2012, 10/04/2013, 08/07/2016  Pneumococcal Conjugate-13 05/11/2014   Pneumococcal Polysaccharide-23 07/13/2012   Pneumococcal-Unspecified 07/15/2012   Zoster, Live  07/29/2012    Past Medical History:  Diagnosis Date   Arthritis    Back pain    Chronic anticoagulation    due to aflutter   Chronic kidney disease    Diabetes mellitus    Diastolic CHF, chronic ([place])    a.  echo 2006 - ef 55-65%; mild diast dysfxn;    b. Echo 08/2011: Mild LVH, EF 60%;  c. 04/2013 Echo: EF 65-69%, mild conc LVH;  08/2014 Echo: EF 60-65%, mild-mod MR.   Gout    Hyperlipidemia    Hypertension    a.  Renal arterial Dopplers 12/2011: 1-59% right renal artery stenosis   Morbid obesity ([place])    Obstructive sleep apnea on CPAP    Paroxysmal Afib/Flutter    a. 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    Tobacco History: Social History   Tobacco Use  Smoking Status Never  Smokeless Tobacco Never   Counseling given: Not Answered   Outpatient Medications Prior to Visit  Medication Sig Dispense Refill   Accu-Chek Softclix Lancets lancets 1 each by Other route 3 (three) times daily. as directed 100 each 3   allopurinol (ZYLOPRIM) 300 MG tablet TAKE 1 TABLET(300 MG) BY MOUTH DAILY 90 tablet 3   amiodarone (PACERONE) 200 MG tablet Take 1 tablet (200 mg total) by mouth daily. 90 tablet 2   blood glucose meter kit and supplies Dispense based on patient and insurance preference. Use up to four times daily as directed. (FOR ICD-10 E10.9, E11.9). 1 each 0   cloNIDine (CATAPRES) 0.3 MG tablet Take 1 tablet (0.3 mg total) by mouth 2 (two) times daily. 180 tablet 3   Continuous Blood Gluc Receiver (FREESTYLE LIBRE 14 DAY READER) DEVI Use as directed. 1 each 0   Continuous Blood Gluc Sensor (FREESTYLE LIBRE 14 DAY SENSOR) MISC Use as directed to check blood sugar daily 2 each 3   cyclobenzaprine (FLEXERIL) 5 MG tablet Take 1 tablet (5 mg total) by mouth as needed for muscle spasms. 30 tablet 0   diltiazem (CARDIZEM) 30 MG tablet Take 1 tablet (30 mg total) by mouth See admin instructions. Take one tablet every 4 hours as needed for heart rate greater than 100 45 tablet 2   ELIQUIS 5  MG TABS tablet TAKE 1 TABLET(5 MG) BY MOUTH TWICE DAILY 180 tablet 1   HYDROcodone bit-homatropine (HYCODAN) 5-1.5 MG/5ML syrup Take 5 mLs by mouth at bedtime as needed for cough. 120 mL 0   Insulin Lispro Prot & Lispro (HUMALOG MIX 75/25 KWIKPEN) (75-25) 100 UNIT/ML Kwikpen Inject 17 Units into the skin 2 (two) times daily. 30 mL 3   lidocaine (LIDODERM) 5 % Place 1 patch onto the skin daily. Remove & Discard patch within 12 hours or as directed by MD (Patient taking differently: Place 1 patch onto the skin daily as needed (Pain). Remove & Discard patch within 12 hours or as directed by MD) 30 patch 0   linaclotide (LINZESS) 145 MCG CAPS capsule Take 1 capsule (145 mcg total) by mouth daily before breakfast. 30 capsule 1   loperamide (IMODIUM) 2 MG capsule Take 1 capsule (2 mg total) by mouth 4 (four) times daily as needed for diarrhea or loose stools. 12 capsule 0   losartan (COZAAR) 100 MG tablet TAKE 1 TABLET BY MOUTH  DAILY 90 tablet 3   Na Sulfate-K Sulfate-Mg Sulf (SUPREP BOWEL PREP KIT) 17.5-3.13-1.6 GM/177ML  SOLN Take 1 kit by mouth as directed. 324 mL 0   NOVOFINE PLUS PEN NEEDLE 32G X 4 MM MISC USE TWICE DAILY AS DIRECTED (Patient taking differently: 1 each by Other route as directed.) 100 each 11   Olopatadine HCl 0.2 % SOLN Place 1 drop into the right eye daily.     ONETOUCH VERIO test strip 1 each by Other route 3 (three) times daily.     potassium chloride (KLOR-CON) 10 MEQ tablet Take 2 tablets (20 mEq total) by mouth daily. 180 tablet 3   RESTASIS MULTIDOSE 0.05 % ophthalmic emulsion Place 1 drop into both eyes 2 (two) times daily.      rosuvastatin (CRESTOR) 10 MG tablet TAKE 1 TABLET BY MOUTH IN  THE EVENING (Patient taking differently: Take 10 mg by mouth every evening.) 90 tablet 3   Semaglutide, 1 MG/DOSE, (OZEMPIC, 1 MG/DOSE,) 4 MG/3ML SOPN Inject 1 mg into the skin once a week. 9 mL 3   torsemide (DEMADEX) 20 MG tablet Take 1 tablet (20 mg total) by mouth daily.      triamcinolone cream (KENALOG) 0.1 % Apply 1 application topically 2 (two) times daily. (Patient taking differently: Apply 1 application topically daily as needed (Rash under breast).) 30 g 0   verapamil (CALAN) 120 MG tablet Take 1 tablet (120 mg total) by mouth in the morning. 180 tablet 3   levothyroxine (SYNTHROID) 75 MCG tablet Take 1 tablet (75 mcg total) by mouth daily before breakfast. 90 tablet 1   No facility-administered medications prior to visit.     Review of Systems:   Constitutional: No weight loss or gain, night sweats, fevers, chills. +fatigue HEENT: No headaches, difficulty swallowing, tooth/dental problems, or sore throat. No sneezing, itching, ear ache. +occasional nasal congestion and clear rhinorrhea CV:  +PND, orthopnea. No chest pain, swelling in lower extremities, anasarca, dizziness, palpitations, syncope Resp: +shortness of breath with exertion and occasionally at rest; productive cough with white sputum; occasional wheeze. No hemoptysis.  No chest wall deformity GI:  No heartburn, indigestion, abdominal pain, nausea, vomiting, diarrhea, change in bowel habits, loss of appetite, bloody stools.  GU: No dysuria, change in color of urine, urgency or frequency.  No flank pain, no hematuria  Skin: No rash, lesions, ulcerations MSK:  No joint pain or swelling.  No back pain. +generalized weakness; uses wheelchair when out of house; LLE larger than RLE at baseline Neuro: No dizziness or lightheadedness.  Psych: No depression or anxiety. Mood stable.     Physical Exam:  BP 124/76 (BP Location: Left Arm, Patient Position: Sitting, Cuff Size: Large)   Pulse 70   Temp 98.1 F (36.7 C) (Oral)   Ht $R'5\' 1"'MK$  (1.549 m)   Wt 241 lb (109.3 kg)   SpO2 96%   BMI 45.54 kg/m   GEN: Pleasant, interactive, chronically-ill appearing; morbidly obese; in no acute distress. HEENT:  Normocephalic and atraumatic. EACs patent bilaterally. TM pearly gray with present light reflex  bilaterally. PERRLA. Sclera white. Nasal turbinates pink, moist and patent bilaterally. Clear rhinorrhea present. Oropharynx erythematous and moist, without exudate or edema. No lesions, ulcerations.  NECK:  Supple w/ fair ROM. No JVD present. Normal carotid impulses w/o bruits. Thyroid symmetrical with no goiter or nodules palpated. No lymphadenopathy.   CV: RRR, no m/r/g, no peripheral edema. Pulses intact, +2 bilaterally. No cyanosis, pallor or clubbing. PULMONARY:  Unlabored, regular breathing. Scattered rhonchi with scattered inspiratory wheeze. No accessory muscle use. No dullness to percussion. GI: BS  present and normoactive. Soft, non-tender to palpation. No organomegaly or masses detected. No CVA tenderness. MSK: No erythema, warmth or tenderness. Cap refil <2 sec all extrem. No deformities or joint swelling noted.  Neuro: A/Ox3. No focal deficits noted.   Skin: Warm, no lesions or rashe Psych: Normal affect and behavior. Judgement and thought content appropriate.     Lab Results:  CBC    Component Value Date/Time   WBC 11.3 (H) 08/23/2021 0932   RBC 4.52 08/23/2021 0932   HGB 11.5 (L) 08/23/2021 0932   HGB 12.0 03/12/2021 1152   HCT 36.5 08/23/2021 0932   HCT 38.2 03/12/2021 1152   PLT 346.0 08/23/2021 0932   PLT 357 03/12/2021 1152   MCV 80.8 08/23/2021 0932   MCV 85 03/12/2021 1152   MCH 26.0 05/08/2021 0849   MCHC 31.4 08/23/2021 0932   RDW 17.6 (H) 08/23/2021 0932   RDW 14.7 03/12/2021 1152   LYMPHSABS 2.7 08/23/2021 0932   LYMPHSABS 3.4 (H) 09/12/2020 1159   MONOABS 1.2 (H) 08/23/2021 0932   EOSABS 0.3 08/23/2021 0932   EOSABS 0.3 09/12/2020 1159   BASOSABS 0.0 08/23/2021 0932   BASOSABS 0.0 09/12/2020 1159    BMET    Component Value Date/Time   NA 140 09/27/2021 1408   NA 141 03/12/2021 1152   K 3.5 09/27/2021 1408   CL 98 09/27/2021 1408   CO2 33 (H) 09/27/2021 1408   GLUCOSE 171 (H) 09/27/2021 1408   BUN 12 09/27/2021 1408   BUN 16 03/12/2021 1152    CREATININE 0.63 09/27/2021 1408   CREATININE 0.85 08/05/2016 1350   CALCIUM 9.2 09/27/2021 1408   GFRNONAA 78 11/29/2020 1121   GFRNONAA >60 08/21/2020 1632   GFRAA 90 11/29/2020 1121    BNP    Component Value Date/Time   BNP 132.4 (H) 05/30/2021 1022     Imaging:  09/27/2021: CXR reviewed by me. Diffused bilateral interstitial opacities and trace bilateral pleural effusions. Cardiomegaly. 09/27/2021: CT chest wo contrast reviewed by me. Atherosclerosis. 11x11x58m solid nodule in RLL. Minimal ground glass opacities in RLL with mild bronchial wall thickening and interlobular septal thickening. Chronic scarring and pleural thickening. Findings favor inflammatory or infectious change vs asymmetric pulmonary edema.   DG Chest 2 View  Result Date: 09/27/2021 CLINICAL DATA:  Increased cough, shortness of breath EXAM: CHEST - 2 VIEW COMPARISON:  08/30/2019 FINDINGS: Cardiomegaly. Diffuse bilateral interstitial opacity, somewhat more conspicuous at the lung bases, as well as trace bilateral pleural effusions. The visualized skeletal structures are unremarkable. IMPRESSION: Cardiomegaly with diffuse bilateral interstitial opacity, somewhat more conspicuous at the lung bases, as well as trace bilateral pleural effusions. Findings are most consistent with edema. Electronically Signed   By: ADelanna AhmadiM.D.   On: 09/27/2021 12:01   CT Chest Wo Contrast  Result Date: 09/27/2021 CLINICAL DATA:  Cough, short of breath, COPD, chronic amiodarone therapy EXAM: CT CHEST WITHOUT CONTRAST TECHNIQUE: Multidetector CT imaging of the chest was performed following the standard protocol without IV contrast. COMPARISON:  01/18/2014, 09/27/2021 FINDINGS: Cardiovascular: The heart is unremarkable without pericardial effusion. There is extensive atherosclerosis of the aorta and coronary vasculature. Normal caliber of the thoracic aorta. Evaluation of the vascular lumen limited without IV contrast. Mediastinum/Nodes:  No enlarged mediastinal or axillary lymph nodes. Thyroid gland, trachea, and esophagus demonstrate no significant findings. Lungs/Pleura: There is an 11 x 11 x 9 mm solid nodule within the right lower lobe, reference image 94/3, with slightly spiculated margins. Minimal ground-glass opacities  are seen elsewhere within the right lower lobe, with mild distal bronchial wall thickening and interlobular septal thickening. There are chronic areas of scarring and pleural thickening at the right lung base, unchanged. No effusion or pneumothorax. Central airways are patent. Upper Abdomen: No acute process. Musculoskeletal: No acute or destructive bony lesions. Reconstructed images demonstrate no additional findings. IMPRESSION: 1. Scattered ground-glass opacities within the right lower lobe, favor inflammatory or infectious change rather than asymmetric edema. 2. 10 mm suspicious right solid pulmonary nodule. Consider a non-contrast Chest CT at 3 months, a PET/CT, or tissue sampling. These guidelines do not apply to immunocompromised patients and patients with cancer. Follow up in patients with significant comorbidities as clinically warranted. For lung cancer screening, adhere to Lung-RADS guidelines. Reference: Radiology. 2017; 284(1):228-43. 3. Chronic scarring at the lung bases, right greater than left. 4.  Aortic Atherosclerosis (ICD10-I70.0). These results will be called to the ordering clinician or representative by the Radiologist Assistant, and communication documented in the PACS or [employer]. Electronically Signed   By: Randa Ngo M.D.   On: 09/27/2021 16:49    methylPREDNISolone acetate (DEPO-MEDROL) injection 80 mg     Date Action Dose Route User   09/27/2021 1240 Given 80 mg Intramuscular (Left Ventrogluteal) Dierdre Highman, RN       PFT Results Latest Ref Rng & Units 03/25/2017  FVC-Pre L 1.72  FVC-Predicted Pre % 68  FVC-Post L 1.67  FVC-Predicted Post % 66  Pre FEV1/FVC % % 61  Post  FEV1/FCV % % 62  FEV1-Pre L 1.05  FEV1-Predicted Pre % 55  FEV1-Post L 1.04  DLCO uncorrected ml/min/mmHg 14.06  DLCO [facility]% % 69  DLVA Predicted % 106  TLC L 5.17  TLC % Predicted % 112  RV % Predicted % 138    No results found for: NITRICOXIDE      Assessment & Plan:   Moderate asthma with acute exacerbation Former PFTs 2018 consistent with moderately severe obstruction with normal TLC and mildly reduced diffusion capacity. CBC 11/4 with eos of 300. No current maintenance therapy. Concern for recurrent exacerbations vs cardiac etiology vs amiodarone lung toxicity. Will start Breztri maintenance inhaler Twice daily. Albuterol inhaler and nebs as needed. Depo 80 inj today and Prednisone 20 mg for 5 days rx. Supportive care for rhinitis symp. Will need repeat PFTs once acute symptoms improve.   Patient Instructions  -Breztri 2 puffs Twice daily, brush tongue and rinse mouth afterwards -Albuterol inhaler 2 puffs or 3 mL nebs as needed  -Prednisone 20 mg for 5 days. Take in the AM with food. Check blood sugars Twice daily and notify if persistently >200.  -Augmentin 875/112m for 7 days. Notify immediately of any rash, itching, hives, or swelling, or seek emergency care. Finish your antibiotics in their entirety. Do not stop just because symptoms improve. Take with food to reduce GI upset. -Over the counter probiotic while on Augmentin   -Saline nasal spray 2-3 times a day as needed for nasal congestion/postnasal drip until symptoms improve -Mucinex 600 mg twice daily as needed until symptoms improve -Chlortab 416mover the counter at night as needed for cough  Labs today include BMET, BNP, CBC with diff, ESR. We will likely increase your fluid pill after your lab results come back  Chest x ray today showed pulmonary edema and concerning for inflammation/infection. CT chest today for further evaluation   Follow up with cardiology. We will contact them to have you set up with a visit.  Continue to use CPAP every night, minimum of 6 hours a night.  Change equipment every 30 days or as directed by DME. Wash your tubing with warm soap and water daily, hang to dry. Wash humidifier portion weekly.  Maintain clean equipment, as directed by home health agency.  Be aware of reduced alertness and do not drive or operate heavy machinery if experiencing this or drowsiness.  Exercise encouraged, as tolerated. Healthy weight management discussed.  Avoid or decrease alcohol consumption and medications that make you more sleepy, if possible. Notify if persistent daytime sleepiness occurs even with consistent use of CPAP.  Strict ED or return precautions if you develop worsening shortness of breath, chest pain/tightness, or swelling in your lower extremities.   Follow up in one week with Dr. Annamaria Boots or Roxan Diesel, NP. If symptoms do not improve or worsen, please contact office for sooner follow up or seek emergency care.   CAP (community acquired pneumonia) CXR today showed diffuse bilateral interstitial opacities as well as trace bilat pleural effusions, consistent with pulmonary edema. No significant change from previous CXR, which were thought to be multilobar pneumonia. Concern for underlying PNA and/or pneumonitis r/t amio today. CT chest STAT for further evaluation did not show evidence of amio related lung toxicity; significant for changes in RLL consistent with infectious or inflammatory process; new 10 mm suspicious RLL solid pulmonary nodule. Repeat CT 3 months or PET for further eval. Will discuss with Dr. Annamaria Boots. Labs BMET, BNP, CBC with diff. ESR elevated. Sputum culture neg. Abx - Augmentin chosen over levaquin d/t risk for QT prolongation with amio. Prednisone 20 mg for 5 days and 80 depo inj. Closely monitor BS at home. Cardiology notified. Strict ED precautions. Close follow up. See above plan.  Persistent atrial fibrillation (HCC) On amio and Eliquis. Rate controlled at  visit.. Low suspicion for PE given compliance with chronic anticoagulation and Wells score 1.5. See above plan.   Chronic diastolic heart failure Appearance of pulmonary edema on CXR. CT for further evaluation. Notified cardiology and requested visit for pt. See above plan.  Pulmonary edema BMET to evaluate kidney function. Will plan to increase diuretic if kidney function nl. Referred back to cards. See above plan.  Dyspnea Pulmonary vs cardiac etiology. See above plan. Strict ED precautions with close follow up.  Cough See above plan.     Clayton Bibles, NP 09/30/2021  Pt aware and understands NP's role.

## 2021-09-27 NOTE — Assessment & Plan Note (Signed)
BMET to evaluate kidney function. Will plan to increase diuretic if kidney function nl. Referred back to cards. See above plan.

## 2021-09-27 NOTE — Assessment & Plan Note (Signed)
See above plan. 

## 2021-09-27 NOTE — Progress Notes (Signed)
HPI F never smoker followed for OSA, Asthma/ COPD, complicated by dCHF, Aortic Atherosclerosis, HTN, PAFib/Amiodarone/Eliquis, Varicose Veins, Restrictive Lung Disease, GERD, Nonalcoholic Fatty Liver, Hypothyroid, DM2/ neuropathy, GOUT, Morbid Obesity,  NPSG 06/05/11-AHI 20.1/ hr, desaturation to  87%, body weight 261 lbs, CPAP 10 PFT 03/25/17- moderately severe obstruction, insignificant response to BD, Nl TLC, DLCO mildly reduced. ============================================================================== 08/12/21- 79 yoF never smoker followed for OSA, Asthma/ COPD, complicated by dCHF, Aortic Atherosclerosis, HTN, PAFib/Amiodarone/Eliquis, Varicose Veins, Restrictive Lung Disease, GERD, Nonalcoholic Fatty Liver, Hypothyroid, DM2/ neuropathy, GOUT, Morbid Obesity,            -no bronchodilators CPAP auto 5-15/ [facility]    replaced 2017   replacement ordered 01/03/21 Download- lapsed after oral surgery, has restarted. Body weight today-244 lbs Covid vax-none Flu vax-no -----Patient says she started using her CPAP again says she stopped due to surgery issues. Feels like she is sleeping good.   CXR 07/24/21- IMPRESSION: Enlargement of cardiac silhouette. Bronchitic changes with subsegmental atelectasis at RIGHT middle lobe. Aortic Atherosclerosis (ICD10-I70  09/30/21-79 yoF never smoker followed for OSA, Asthma/ COPD, Chronic Cough, complicated by dCHF, Aortic Atherosclerosis, HTN, PAFib/Amiodarone/Eliquis, Varicose Veins, Restrictive Lung Disease, GERD, Nonalcoholic Fatty Liver, Hypothyroid, DM2/ neuropathy, GOUT, Morbid Obesity,            -no bronchodilators -Prednisone 20 x 5 days, augmentin, Hycodan, amiodarone, Breztri CPAP auto 5-15/ [facility]    replaced 2017   replacement ordered 01/03/21 Download- Body weight today-241 lbs Covid vax-no  Flu vax-no LOV NP [place] 12/9- Chronic cough. 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 Started augmentin and prednisone. -----Patient states that she is feeling much better since she saw Katie.  Script for Judithann Sauger was sent to [facility] so only had sample ow has her replacement machine.  We are requesting download. She can tell she is back in atrial fibrillation with cardiology appointment tomorrow. After antibiotic and prednisone restarted, her breathing has been much more comfortable.  She has a few days left of each. We reviewed recent labs including  BNP 226, elevated glucose on prednisone for which she has contacted her diabetes doctor CT scan findings including right lung nodule CT chest 09/27/21- IMPRESSION: 1. Scattered ground-glass opacities within the right lower lobe, favor inflammatory or infectious change rather than asymmetric edema. 2. 10 mm suspicious right solid pulmonary nodule. Consider a non-contrast Chest CT at 3 months, a PET/CT, or tissue sampling. These guidelines do not apply to immunocompromised patients and patients with cancer. Follow up in patients with significant comorbidities as clinically warranted. For lung cancer screening, adhere to Lung-RADS guidelines. Reference: Radiology. 2017; 284(1):228-43. 3. Chronic scarring at the lung bases, right greater than left. 4.  Aortic Atherosclerosis (ICD10-I70.0).   ROS-see HPI   + = positive Constitutional:    weight loss, night sweats, fevers, chills, fatigue, lassitude. HEENT:    headaches, difficulty swallowing, tooth/dental problems, sore throat,       sneezing, itching, ear ache, nasal congestion, post nasal drip, snoring CV:    chest pain, orthopnea, PND, swelling in lower extremities, anasarca,                                   dizziness, palpitations Resp:   +  shortness of breath with exertion or at rest.                productive cough,   non-productive cough, coughing up of blood.              change in color of mucus.  wheezing.   Skin:    rash or  lesions. GI:  No-   heartburn, indigestion, abdominal pain, nausea, vomiting, diarrhea,                 change in bowel habits, loss of appetite GU: dysuria, change in color of urine, +urgency or frequency.   flank pain. MS:   joint pain, stiffness, decreased range of motion, back pain. Neuro-     nothing unusual Psych:  change in mood or affect.  depression or anxiety.   memory loss.  OBJ- Physical Exam General- Alert, Oriented, Affect-appropriate, Distress- none acute, + morbidly obese, + wheelchair Skin- rash-none, lesions- none, excoriation- none Lymphadenopathy- none Head- atraumatic            Eyes- Gross vision intact, PERRLA, conjunctivae and secretions clear,             Ears- Hearing, canals-normal            Nose- Clear, no-Septal dev, mucus, polyps, erosion, perforation             Throat- Mallampati II-III , mucosa clear , drainage- none, tonsils- absent, + teeth Neck- flexible , trachea midline, no stridor , thyroid nl, carotid no bruit Chest - symmetrical excursion , unlabored           Heart/CV- IRR/ AFib , + murmur 1-2+S , no gallop  , no rub, nl s1 s2                           - JVD-none, edema- none, stasis changes- none, varices- none           Lung- clear to P&A, no rales or crackles, wheeze- none, cough- none , dullness-none, rub- none           Chest wall-  Abd-  Br/ Gen/ Rectal- Not done, not indicated Extrem- cyanosis- none, clubbing, none, atrophy- none, strength- nl Neuro- grossly intact to observation

## 2021-09-27 NOTE — Assessment & Plan Note (Signed)
On amio and Eliquis. Rate controlled at visit.. Low suspicion for PE given compliance with chronic anticoagulation and Wells score 1.5. See above plan.

## 2021-09-27 NOTE — Assessment & Plan Note (Addendum)
CXR today showed diffuse bilateral interstitial opacities as well as trace bilat pleural effusions, consistent with pulmonary edema. No significant change from previous CXR, which were thought to be multilobar pneumonia. Concern for underlying PNA and/or pneumonitis r/t amio today. CT chest STAT for further evaluation did not show evidence of amio related lung toxicity; significant for changes in RLL consistent with infectious or inflammatory process; new 10 mm suspicious RLL solid pulmonary nodule. Repeat CT 3 months or PET for further eval. Will discuss with Dr. Annamaria Boots. Labs BMET, BNP, CBC with diff. ESR elevated. Sputum culture neg. Abx - Augmentin chosen over levaquin d/t risk for QT prolongation with amio. Prednisone 20 mg for 5 days and 80 depo inj. Closely monitor BS at home. Cardiology notified. Strict ED precautions. Close follow up. See above plan.

## 2021-09-27 NOTE — Telephone Encounter (Signed)
Pt at pharmacy in [place] but meds [facility]   Changed augmentin and prednisone to [place] of her choice   She does not have albuterol hfa or nebulizer but doesn't know how to use either so advised go to ER if breathing getting worse on rx as above plus her usual meds.   Note on amiodarone so will need f/u to be sure this is not amiodarone toxicity

## 2021-09-27 NOTE — Assessment & Plan Note (Addendum)
Former PFTs 2018 consistent with moderately severe obstruction with normal TLC and mildly reduced diffusion capacity. CBC 11/4 with eos of 300. No current maintenance therapy. Concern for recurrent exacerbations vs cardiac etiology vs amiodarone lung toxicity. Will start Breztri maintenance inhaler Twice daily. Albuterol inhaler and nebs as needed. Depo 80 inj today and Prednisone 20 mg for 5 days rx. Supportive care for rhinitis symp. Will need repeat PFTs once acute symptoms improve.   Patient Instructions  -Breztri 2 puffs Twice daily, brush tongue and rinse mouth afterwards -Albuterol inhaler 2 puffs or 3 mL nebs as needed  -Prednisone 20 mg for 5 days. Take in the AM with food. Check blood sugars Twice daily and notify if persistently >200.  -Augmentin 875/134m for 7 days. Notify immediately of any rash, itching, hives, or swelling, or seek emergency care. Finish your antibiotics in their entirety. Do not stop just because symptoms improve. Take with food to reduce GI upset. -Over the counter probiotic while on Augmentin   -Saline nasal spray 2-3 times a day as needed for nasal congestion/postnasal drip until symptoms improve -Mucinex 600 mg twice daily as needed until symptoms improve -Chlortab 485mover the counter at night as needed for cough  Labs today include BMET, BNP, CBC with diff, ESR. We will likely increase your fluid pill after your lab results come back  Chest x ray today showed pulmonary edema and concerning for inflammation/infection. CT chest today for further evaluation   Follow up with cardiology. We will contact them to have you set up with a visit.   Continue to use CPAP every night, minimum of 6 hours a night.  Change equipment every 30 days or as directed by DME. Wash your tubing with warm soap and water daily, hang to dry. Wash humidifier portion weekly.  Maintain clean equipment, as directed by home health agency.  Be aware of reduced alertness and do not drive or  operate heavy machinery if experiencing this or drowsiness.  Exercise encouraged, as tolerated. Healthy weight management discussed.  Avoid or decrease alcohol consumption and medications that make you more sleepy, if possible. Notify if persistent daytime sleepiness occurs even with consistent use of CPAP.  Strict ED or return precautions if you develop worsening shortness of breath, chest pain/tightness, or swelling in your lower extremities.   Follow up in one week with Dr. YoAnnamaria Bootsr KaRoxan DieselNP. If symptoms do not improve or worsen, please contact office for sooner follow up or seek emergency care.

## 2021-09-27 NOTE — Patient Instructions (Addendum)
-  Breztri 2 puffs Twice daily, brush tongue and rinse mouth afterwards -Albuterol inhaler 2 puffs or 3 mL nebs as needed  -Prednisone 20 mg for 5 days. Take in the AM with food. Check blood sugars Twice daily and notify if persistently >200.  -Augmentin 875/162m for 7 days. Notify immediately of any rash, itching, hives, or swelling, or seek emergency care. Finish your antibiotics in their entirety. Do not stop just because symptoms improve. Take with food to reduce GI upset. -Over the counter probiotic while on Augmentin   -Saline nasal spray 2-3 times a day as needed for nasal congestion/postnasal drip until symptoms improve -Mucinex 600 mg twice daily as needed until symptoms improve -Chlortab 438mover the counter at night as needed for cough  Labs today include BMET, BNP, CBC with diff, ESR. We will likely increase your fluid pill after your lab results come back  Chest x ray today showed pulmonary edema and concerning for inflammation/infection. CT chest today for further evaluation   Follow up with cardiology. We will contact them to have you set up with a visit.   Continue to use CPAP every night, minimum of 6 hours a night.  Change equipment every 30 days or as directed by DME. Wash your tubing with warm soap and water daily, hang to dry. Wash humidifier portion weekly.  Maintain clean equipment, as directed by home health agency.  Be aware of reduced alertness and do not drive or operate heavy machinery if experiencing this or drowsiness.  Exercise encouraged, as tolerated. Healthy weight management discussed.  Avoid or decrease alcohol consumption and medications that make you more sleepy, if possible. Notify if persistent daytime sleepiness occurs even with consistent use of CPAP.  Strict ED or return precautions if you develop worsening shortness of breath, chest pain/tightness, or swelling in your lower extremities.   Follow up in one week with Dr. YoAnnamaria Bootsr KaRoxan DieselNP. If  symptoms do not improve or worsen, please contact office for sooner follow up or seek emergency care.

## 2021-09-27 NOTE — Telephone Encounter (Signed)
This was confusing to the patient I have scheduled her for today at [facility] [place] pt is aware

## 2021-09-30 ENCOUNTER — Encounter: Payer: Self-pay | Admitting: Internal Medicine

## 2021-09-30 ENCOUNTER — Other Ambulatory Visit: Payer: Self-pay | Admitting: Nurse Practitioner

## 2021-09-30 ENCOUNTER — Ambulatory Visit (INDEPENDENT_AMBULATORY_CARE_PROVIDER_SITE_OTHER): Payer: Medicare Other | Admitting: Internal Medicine

## 2021-09-30 ENCOUNTER — Telehealth: Payer: Self-pay

## 2021-09-30 ENCOUNTER — Other Ambulatory Visit: Payer: Self-pay

## 2021-09-30 ENCOUNTER — Other Ambulatory Visit: Payer: Medicare Other

## 2021-09-30 DIAGNOSIS — G4733 Obstructive sleep apnea (adult) (pediatric): Secondary | ICD-10-CM | POA: Diagnosis not present

## 2021-09-30 DIAGNOSIS — Z9989 Dependence on other enabling machines and devices: Secondary | ICD-10-CM

## 2021-09-30 DIAGNOSIS — J4541 Moderate persistent asthma with (acute) exacerbation: Secondary | ICD-10-CM | POA: Diagnosis not present

## 2021-09-30 DIAGNOSIS — R911 Solitary pulmonary nodule: Secondary | ICD-10-CM

## 2021-09-30 DIAGNOSIS — I5032 Chronic diastolic (congestive) heart failure: Secondary | ICD-10-CM

## 2021-09-30 LAB — RESPIRATORY CULTURE OR RESPIRATORY AND SPUTUM CULTURE
MICRO NUMBER:: 12737903
RESULT:: NORMAL
SPECIMEN QUALITY:: ADEQUATE

## 2021-09-30 NOTE — Assessment & Plan Note (Signed)
Machine replaced about 3 weeks ago.  She says it is comfortable. Plan-continue CPAP auto 5-15.  Requesting download

## 2021-09-30 NOTE — Telephone Encounter (Signed)
Patient advise and will increase medication.

## 2021-09-30 NOTE — Assessment & Plan Note (Signed)
Right lung nodule on chest CT 09/27/2021 Plan repeat chest CT in 3 months.  Based on status then consider PET scan, further work-up in this  non-smoker.

## 2021-09-30 NOTE — Assessment & Plan Note (Signed)
L.  She will try Breztri when the prescription is filled.

## 2021-09-30 NOTE — Assessment & Plan Note (Signed)
Nonspecific imaging probably reflects combination of fluid overload and bronchitis or bronchopneumonia.  She feels much better on Augmentin and prednisone.  I am not sure how much difference the Augmentin is making.  She understands the prednisone is raising her blood sugar and she is in touch with her diabetic doctor. Plan-finish current course and watch for stability.

## 2021-09-30 NOTE — Patient Instructions (Signed)
Finish prednisone as planned- watch out for a drop in your blood sugar  Finish the antibiotic as planned  The Breztri inhaler will be coming in the mail from [facility].  Inhale 2 puffs, then rinse your mouth, twice daily. See if this helps your breathing.  Order- CT chest, no contrast future- in 3 months    dx right lung nodule, amiodarone therapy  Order- DME [facility]- continue auto 5-15. Please add AirView/ card  Please call as needed

## 2021-09-30 NOTE — Telephone Encounter (Signed)
Patient called and spoke with [facility] in regards to blood sugar levels. Patient has pneumonia and is being treated with steroids. Glucose level is running high at 300s. This was reported on 09/29/2021. Patient would like to increase Humalog.

## 2021-09-30 NOTE — Progress Notes (Signed)
Contacted patient and notifed that her CT scan results were consistent with a likely pneumonia and to continue on the abx we prescribed Friday. No evidence of pneumonitis or amio related lung toxicity. Awaiting sputum culture results. New 11x11x9 mm solid nodule in RLL, which we will follow up with a CT scan in 3 months to re-evaluate. ESR elevated, which can be seen during infectious process. Follow up with Dr. Annamaria Boots this afternoon and cardiology tomorrow, as scheduled. Stated she was in a fib now; advised on ED precautions. Verbalized understanding.

## 2021-09-30 NOTE — Telephone Encounter (Signed)
Patient advise and states that she has insulin her Humalog yesterday and today and she has still been over 300s. Patient states that the on call provider advise her to do 20 units at breakfast and 25 units at dinner. Patient states that she will be at appt on Wednesday.

## 2021-10-01 ENCOUNTER — Encounter: Payer: Self-pay | Admitting: Internal Medicine

## 2021-10-01 ENCOUNTER — Telehealth: Payer: Self-pay | Admitting: Internal Medicine

## 2021-10-01 ENCOUNTER — Ambulatory Visit (INDEPENDENT_AMBULATORY_CARE_PROVIDER_SITE_OTHER): Payer: Medicare Other | Admitting: Internal Medicine

## 2021-10-01 VITALS — BP 120/70 | HR 107 | Ht 61.0 in | Wt 242.0 lb

## 2021-10-01 DIAGNOSIS — I4819 Other persistent atrial fibrillation: Secondary | ICD-10-CM

## 2021-10-01 NOTE — Telephone Encounter (Signed)
Spoke with pt and reviewed Cardioversion instructions scheduled for 10/18/2021.  See instruction sheet for complete details.  Letter placed in mail for pt.  Pt verbalizes understanding and thanked Therapist, sports for the call.

## 2021-10-01 NOTE — Telephone Encounter (Signed)
Patient called to schedule her cardioversion.

## 2021-10-01 NOTE — Patient Instructions (Signed)
Medication Instructions:  Your physician recommends that you continue on your current medications as directed. Please refer to the Current Medication list given to you today.  *If you need a refill on your cardiac medications before your next appointment, please call your pharmacy*   Lab Work: None ordered.  If you have labs (blood work) drawn today and your tests are completely normal, you will receive your results only by: [place] (if you have MyChart) OR A paper copy in the mail If you have any lab test that is abnormal or we need to change your treatment, we will call you to review the results.   Testing/Procedures: Your physician has recommended that you have a Cardioversion (DCCV). Electrical Cardioversion uses a jolt of electricity to your heart either through paddles or wired patches attached to your chest. This is a controlled, usually prescheduled, procedure. Defibrillation is done under light anesthesia in the hospital, and you usually go home the day of the procedure. This is done to get your heart back into a normal rhythm. You are not awake for the procedure. Please see the instruction sheet given to you today.    Follow-Up: At [facility], you and your health needs are our priority.  As part of our continuing mission to provide you with exceptional heart care, we have created designated Provider Care Teams.  These Care Teams include your primary Cardiologist (physician) and Advanced Practice Providers (APPs -  Physician Assistants and Nurse Practitioners) who all work together to provide you with the care you need, when you need it.  We recommend signing up for the patient portal called "MyChart".  Sign up information is provided on this After Visit Summary.  MyChart is used to connect with patients for Virtual Visits (Telemedicine).  Patients are able to view lab/test results, encounter notes, upcoming appointments, etc.  Non-urgent messages can be sent to your  provider as well.   To learn more about what you can do with MyChart, go to [email or web address].    Your next appointment:   Follow up to be scheduled 1}    Other Instructions  You are scheduled for a Cardioversion on Friday, 10/18/2021 with Dr. Harriet Masson.  Please arrive at the [facility] (Main Entrance A) at [facility]: [place], [place] [address] at 630 am  DIET: Nothing to eat or drink after midnight except a sip of water with medications (see medication instructions below)  FYI: For your safety, and to allow [place] to monitor your vital signs accurately during the surgery/procedure we request that   if you have artificial nails, gel coating, SNS etc. Please have those removed prior to your surgery/procedure. Not having the nail coverings /polish removed may result in cancellation or delay of your surgery/procedure.   Medication Instructions:  Only take half of your night time dose of Insulin the night before your procedure Hold all you medications the morning of your cardioversion except your Eliquis.  Continue your anticoagulant: Eliquis You will need to continue your anticoagulant after your procedure until you  are told by your Provider that it is safe to stop   Labs: You will have labs drawn the morning of your procedure at the hospital.  You must have a responsible person to drive you home and stay in the waiting area during your procedure. Failure to do so could result in cancellation.  Bring your insurance cards.  *Special Note: Every effort is made to have your procedure done on time. Occasionally  there are emergencies that occur at the hospital that may cause delays. Please be patient if a delay does occur.

## 2021-10-01 NOTE — Progress Notes (Signed)
Patient Care Team: Horald Pollen, MD as PCP - General (Internal Medicine) [name], MD as PCP - Electrophysiology (Cardiology) Shawnee Knapp, MD as Resident (Family Medicine) Knox Royalty, RN as Case Manager [place], Darnelle Maffucci, [facility] as Pharmacist (Pharmacist)   HPI  [name] is a 80 y.o. female seen in followup for atrial arrhythmias/fibrillation associated with diastolic heart failure. She's been tried on flecainide, dofetilide, and most recently has been on amiodarone.    Had been followed for sleep apnea [place] no bleeding Patient denies symptoms of GI intolerance, sun sensitivity, neurological symptoms attributable to amiodarone however, pulmonary evaluation by Dr. Annamaria Boots his been concerning for the possibility of amiodarone lung toxicity based on CT imaging  The patient denies chest pain, or peripheral edema.  There has been no lightheadedness   or syncope .  Complains of shortness of breath but no change associated with her palpitations which she recognizes as the recent onset of atrial fibrillation which she can tell by palp   Last Thrusdsay, she could not get out of her car due to her difficulty breathing when she went to pulmonary, imaging suggested recurrent pneumonia.  She was also concerned about an abnormality on CT imaging that was to be followed up with repeat imaging in 3 months.  I read her the report that described a suspicious lesion that was somewhat spiculated.  Dr. Annamaria Boots has seen and anticipates repeat imaging y     DATE TEST EF   11/12 Echo   60-65 %   11/15 Echo   60-65 % MR mild-mod  8/17 Echo  60-65% LAE (47/2.1/38)  8/22 Echo  65-70% AS mild        Date Cr K Hgb TSH LFTs PFTs  8/17    2.444    3/18    0.175    4/19  0.93 4.0 13.3   17 106%  6/19 0.75 4.1      8/20 0.85 3.8 12.4 0.95    9/21 0.86 3.3 12.7 2.05 15   11/21 0.78 3.4 12.2   11                 Thromboembolic risk factors ( age  -2, HTN-1,  DM-1, Gender-1) for a CHADSVASc Score of >=5  Past Medical History:  Diagnosis Date   Arthritis    Back pain    Chronic anticoagulation    due to aflutter   Chronic kidney disease    Diabetes mellitus    Diastolic CHF, chronic ([place])    a.  echo 2006 - ef 55-65%; mild diast dysfxn;    b. Echo 08/2011: Mild LVH, EF 60%;  c. 04/2013 Echo: EF 65-69%, mild conc LVH;  08/2014 Echo: EF 60-65%, mild-mod MR.   Gout    Hyperlipidemia    Hypertension    a.  Renal arterial Dopplers 12/2011: 1-59% right renal artery stenosis   Morbid obesity ([place])    Obstructive sleep apnea on CPAP    Paroxysmal Afib/Flutter    a. 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    Past Surgical History:  Procedure Laterality Date   APPENDECTOMY     ATRIAL FLUTTER ABLATION N/A 09/24/2011   Procedure: ATRIAL FLUTTER ABLATION;  Surgeon: [name], MD;  Location: [facility] CATH LAB;  Service: Cardiovascular;  Laterality: N/A;   CARDIOVERSION  10/22/2011   Procedure: CARDIOVERSION;  Surgeon: [name], MD;  Location: [place];  Service: Cardiovascular;  Laterality: N/A;   CARDIOVERSION N/A 09/10/2011   Procedure: CARDIOVERSION;  Surgeon: [name], MD;  Location: [facility] CATH LAB;  Service: Cardiovascular;  Laterality: N/A;   CHOLECYSTECTOMY     COLONOSCOPY WITH PROPOFOL N/A 06/13/2021   Procedure: COLONOSCOPY WITH PROPOFOL;  Surgeon: Milus Banister, MD;  Location: [facility] ENDOSCOPY;  Service: Endoscopy;  Laterality: N/A;   POLYPECTOMY  06/13/2021   Procedure: POLYPECTOMY;  Surgeon: Milus Banister, MD;  Location: [facility] ENDOSCOPY;  Service: Endoscopy;;   TONSILLECTOMY  1982   TOTAL ABDOMINAL HYSTERECTOMY      Current Outpatient Medications  Medication Sig Dispense Refill   Accu-Chek Softclix Lancets lancets 1 each by Other route 3 (three) times daily. as directed 100 each 3   allopurinol (ZYLOPRIM) 300 MG tablet TAKE 1 TABLET(300 MG) BY MOUTH DAILY 90 tablet 3   amiodarone (PACERONE) 200 MG tablet Take 1 tablet (200 mg total)  by mouth daily. 90 tablet 2   amoxicillin-clavulanate (AUGMENTIN) 875-125 MG tablet Take 1 tablet by mouth 2 (two) times daily. 14 tablet 0   blood glucose meter kit and supplies Dispense based on patient and insurance preference. Use up to four times daily as directed. (FOR ICD-10 E10.9, E11.9). 1 each 0   Budeson-Glycopyrrol-Formoterol (BREZTRI AEROSPHERE) 160-9-4.8 MCG/ACT AERO Inhale 2 puffs into the lungs in the morning and at bedtime. 5.9 g 3   Budeson-Glycopyrrol-Formoterol (BREZTRI AEROSPHERE) 160-9-4.8 MCG/ACT AERO Inhale 2 puffs into the lungs in the morning and at bedtime. 5.9 g 0   cloNIDine (CATAPRES) 0.3 MG tablet Take 1 tablet (0.3 mg total) by mouth 2 (two) times daily. 180 tablet 3   Continuous Blood Gluc Receiver (FREESTYLE LIBRE 14 DAY READER) DEVI Use as directed. 1 each 0   Continuous Blood Gluc Sensor (FREESTYLE LIBRE 14 DAY SENSOR) MISC Use as directed to check blood sugar daily 2 each 3   cyclobenzaprine (FLEXERIL) 5 MG tablet Take 1 tablet (5 mg total) by mouth as needed for muscle spasms. 30 tablet 0   diltiazem (CARDIZEM) 30 MG tablet Take 1 tablet (30 mg total) by mouth See admin instructions. Take one tablet every 4 hours as needed for heart rate greater than 100 45 tablet 2   ELIQUIS 5 MG TABS tablet TAKE 1 TABLET(5 MG) BY MOUTH TWICE DAILY 180 tablet 1   HYDROcodone bit-homatropine (HYCODAN) 5-1.5 MG/5ML syrup Take 5 mLs by mouth at bedtime as needed for cough. 120 mL 0   Insulin Lispro Prot & Lispro (HUMALOG MIX 75/25 KWIKPEN) (75-25) 100 UNIT/ML Kwikpen Inject 17 Units into the skin 2 (two) times daily. 30 mL 3   levothyroxine (SYNTHROID) 75 MCG tablet Take 1 tablet (75 mcg total) by mouth daily before breakfast. 90 tablet 1   lidocaine (LIDODERM) 5 % Place 1 patch onto the skin daily. Remove & Discard patch within 12 hours or as directed by MD (Patient taking differently: Place 1 patch onto the skin daily as needed (Pain). Remove & Discard patch within 12 hours or as  directed by MD) 30 patch 0   linaclotide (LINZESS) 145 MCG CAPS capsule Take 1 capsule (145 mcg total) by mouth daily before breakfast. 30 capsule 1   loperamide (IMODIUM) 2 MG capsule Take 1 capsule (2 mg total) by mouth 4 (four) times daily as needed for diarrhea or loose stools. 12 capsule 0   losartan (COZAAR) 100 MG tablet TAKE 1 TABLET BY MOUTH  DAILY 90 tablet 3   Na Sulfate-K Sulfate-Mg Sulf (  SUPREP BOWEL PREP KIT) 17.5-3.13-1.6 GM/177ML SOLN Take 1 kit by mouth as directed. 324 mL 0   NOVOFINE PLUS PEN NEEDLE 32G X 4 MM MISC USE TWICE DAILY AS DIRECTED (Patient taking differently: 1 each by Other route as directed.) 100 each 11   Olopatadine HCl 0.2 % SOLN Place 1 drop into the right eye daily.     ONETOUCH VERIO test strip 1 each by Other route 3 (three) times daily.     potassium chloride (KLOR-CON) 10 MEQ tablet Take 2 tablets (20 mEq total) by mouth daily. 180 tablet 3   predniSONE (DELTASONE) 20 MG tablet Take 1 tablet (20 mg total) by mouth daily with breakfast for 5 days. 5 tablet 0   RESTASIS MULTIDOSE 0.05 % ophthalmic emulsion Place 1 drop into both eyes 2 (two) times daily.      rosuvastatin (CRESTOR) 10 MG tablet TAKE 1 TABLET BY MOUTH IN  THE EVENING (Patient taking differently: Take 10 mg by mouth every evening.) 90 tablet 3   Semaglutide, 1 MG/DOSE, (OZEMPIC, 1 MG/DOSE,) 4 MG/3ML SOPN Inject 1 mg into the skin once a week. 9 mL 3   torsemide (DEMADEX) 20 MG tablet Take 1 tablet (20 mg total) by mouth daily.     triamcinolone cream (KENALOG) 0.1 % Apply 1 application topically 2 (two) times daily. (Patient taking differently: Apply 1 application topically daily as needed (Rash under breast).) 30 g 0   verapamil (CALAN) 120 MG tablet Take 1 tablet (120 mg total) by mouth in the morning. 180 tablet 3   No current facility-administered medications for this visit.    Allergies  Allergen Reactions   Doxycycline     Made patient feel very ill    Metformin Diarrhea     Review of Systems negative except from HPI and PMH ROS: (+) Difficulty Breathing (+) Palpitations  Physical Exam BP 120/70 (BP Location: Left Arm, Patient Position: Sitting, Cuff Size: Normal)    Pulse (!) 107    Ht '5\' 1"'  (1.549 m)    Wt 242 lb (109.8 kg)    SpO2 95%    BMI 45.73 kg/m    Well developed and Morbidly obese in no acute distress HENT normal Neck supple with JVP-  flat   Clear Regular rate and rhythm, no murmurs or gallops Abd-soft with active BS No Clubbing cyanosis edema Skin-warm and dry A & Oriented  Grossly normal sensory and motor function  ECG atrial fib '@10' -/08/32   Assessment and  Plan  Hypertension   Persistent atrial fibrillation   HFpEF  Chronic  Hypokalemia  Rash  Obstructive sleep apnea-treated  High Risk Medication Surveillance-=amiodarone  Morbidly obese   In atrial fibrillation.  Thankfully has not significantly changed her symptoms.  We will undertake cardioversion as a schedule permits. 12/30  We will continue her amiodarone currently 200 mg daily and I will reach out to Dr. Annamaria Boots given his concerns about amiodarone lung toxicity.  She has not missed her Eliquis.  We will continue at 5 mg twice daily  She is euvolemic.  Continue Demadex 20 mg a day.  Blood pressure is well controlled.  Continue her losartan 100 mg a day and clonidine 0.3 twice daily  F/U in 6 months/year  I,Tinashe Williams,acting as a scribe for [name], MD.,have documented all relevant documentation on the behalf of [name], MD,as directed by  [name], MD while in the presence of [name], MD.   I, [name], MD, have reviewed  all documentation for this visit. The documentation on 10/01/21 for the exam, diagnosis, procedures, and orders are all accurate and complete.

## 2021-10-03 ENCOUNTER — Telehealth: Payer: Self-pay | Admitting: Internal Medicine

## 2021-10-04 ENCOUNTER — Ambulatory Visit (INDEPENDENT_AMBULATORY_CARE_PROVIDER_SITE_OTHER): Payer: Medicare Other | Admitting: Internal Medicine

## 2021-10-04 ENCOUNTER — Encounter: Payer: Self-pay | Admitting: Internal Medicine

## 2021-10-04 VITALS — BP 140/90 | HR 79 | Ht 61.0 in | Wt 241.0 lb

## 2021-10-04 DIAGNOSIS — I10 Essential (primary) hypertension: Secondary | ICD-10-CM | POA: Diagnosis not present

## 2021-10-04 DIAGNOSIS — Z794 Long term (current) use of insulin: Secondary | ICD-10-CM

## 2021-10-04 DIAGNOSIS — E1165 Type 2 diabetes mellitus with hyperglycemia: Secondary | ICD-10-CM | POA: Diagnosis not present

## 2021-10-04 DIAGNOSIS — E1142 Type 2 diabetes mellitus with diabetic polyneuropathy: Secondary | ICD-10-CM

## 2021-10-04 DIAGNOSIS — R0683 Snoring: Secondary | ICD-10-CM | POA: Diagnosis not present

## 2021-10-04 DIAGNOSIS — G4733 Obstructive sleep apnea (adult) (pediatric): Secondary | ICD-10-CM | POA: Diagnosis not present

## 2021-10-04 MED ORDER — INSULIN LISPRO PROT & LISPRO (75-25 MIX) 100 UNIT/ML KWIKPEN: 25.0000 [IU] | PEN_INJECTOR | Freq: Two times a day (BID) | SUBCUTANEOUS | 4 refills | Status: DC

## 2021-10-04 NOTE — Patient Instructions (Signed)
°-   Decrease Humalog Mix 25 units with Breakfast and 25 units with Supper  - Continue  Ozempic  1 mg weekly      HOW TO TREAT LOW BLOOD SUGARS (Blood sugar LESS THAN 70 MG/DL) Please follow the RULE OF 15 for the treatment of hypoglycemia treatment (when your (blood sugars are less than 70 mg/dL)   STEP 1: Take 15 grams of carbohydrates when your blood sugar is low, which includes:  3-4 GLUCOSE TABS  OR 3-4 OZ OF JUICE OR REGULAR SODA OR ONE TUBE OF GLUCOSE GEL    STEP 2: RECHECK blood sugar in 15 MINUTES STEP 3: If your blood sugar is still low at the 15 minute recheck --> then, go back to STEP 1 and treat AGAIN with another 15 grams of carbohydrates.

## 2021-10-04 NOTE — Telephone Encounter (Signed)
ATC patient to go over recs with her from Dr. Annamaria Boots, [facility]

## 2021-10-04 NOTE — Telephone Encounter (Signed)
The last download that we have showed excellent control of sleep apnea when she does wear her machine, but many short and missed nights. We will need a  current download, but her settings should be right. If she continues to snore when her CPAP is on and mask fits comfortably then we will need to reassess.

## 2021-10-04 NOTE — Progress Notes (Signed)
Name: [name]  Age/ Sex: 80 y.o., female   MRN/ DOB: [id number], March 24, 1941     PCP: Georgina Quint, MD   Reason for Endocrinology Evaluation: Type 2 Diabetes Mellitus  Initial Endocrine Consultative Visit: 06/21/2020    PATIENT IDENTIFIER: [name] is a 80 y.o. female with a past medical history of HTN, PAF, CHF, OSA and Dyslipidemia . The patient has followed with Endocrinology clinic since 06/21/2020 for consultative assistance with management of her diabetes.  DIABETIC HISTORY:  [name] was diagnosed with DM in 2011,she is intolerant to metformin due to diarrhea. Her hemoglobin A1c has ranged from 7.3% in 2011, peaking at 9.5% in 2014.     Due to hypokalemia and cushingoid features we proceeded with salivary cortisol (  1st sample 0.73 ug/dL 2nd sample 8.46 ug/dL   Which is normal  SUBJECTIVE:   During the last visit (01/10/2021): A1c 7.4 %, adjusted insulin and restarted Ozempic      Today (10/04/2021): [name] is here for follow-up on diabetes management.  She is accompanied by her best friend.  She checks her blood sugars multiple times a day  times daily, through CGM . The patient has not had hypoglycemic episodes since the last clinic visit.   Denies nausea or diarrhea She is on a wheel chair today due to A. Fib and recent PNA  Scheduled for cardioversion 10/18/2021 for A.Fib   She ate a muffin and coffee this am without insulin  , BG 164   Denies nausea, vomiting or diarrhea    HOME DIABETES REGIMEN:  Humalog Mix 30 units with Breakfast and Supper  Ozempic 1 mg weekly       Statin: yes ACE-I/ARB: yes   CONTINUOUS GLUCOSE MONITORING RECORD INTERPRETATION    Dates of Recording: 12/3-12/16/2022  Sensor description:freestyle libre  Results statistics:   CGM use % of time 97  Average and SD 188/26  Time in range      54  %  % Time Above 180 34  % Time above 250 12  % Time Below target 0     Glycemic patterns  summary: BG's optimal overnight but hyperglycemia is noted during the day   Hyperglycemic episodes  during the day   Hypoglycemic episodes occurred N/A  Overnight periods: optimal           DIABETIC COMPLICATIONS: Microvascular complications:  Neuropathy Denies: CKD, , retinopathy Last eye exam: Completed 03/2020   Macrovascular complications:    Denies: CAD, PVD, CVA    HISTORY:  Past Medical History:  Past Medical History:  Diagnosis Date   Arthritis    Back pain    Chronic anticoagulation    due to aflutter   Chronic kidney disease    Diabetes mellitus    Diastolic CHF, chronic ([facility])    a.  echo 2006 - ef 55-65%; mild diast dysfxn;    b. Echo 08/2011: Mild LVH, EF 60%;  c. 04/2013 Echo: EF 65-69%, mild conc LVH;  08/2014 Echo: EF 60-65%, mild-mod MR.   Gout    Hyperlipidemia    Hypertension    a.  Renal arterial Dopplers 12/2011: 1-59% right renal artery stenosis   Morbid obesity ([facility])    Obstructive sleep apnea on CPAP    Paroxysmal Afib/Flutter    a. 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   Past Surgical History:  Past Surgical History:  Procedure Laterality Date   APPENDECTOMY     ATRIAL FLUTTER ABLATION N/A 09/24/2011  Procedure: ATRIAL FLUTTER ABLATION;  Surgeon: Marinus Maw, MD;  Location: [facility] CATH LAB;  Service: Cardiovascular;  Laterality: N/A;   CARDIOVERSION  10/22/2011   Procedure: CARDIOVERSION;  Surgeon: Duke Salvia, MD;  Location: [facility] OR;  Service: Cardiovascular;  Laterality: N/A;   CARDIOVERSION N/A 09/10/2011   Procedure: CARDIOVERSION;  Surgeon: Duke Salvia, MD;  Location: [facility] CATH LAB;  Service: Cardiovascular;  Laterality: N/A;   CHOLECYSTECTOMY     COLONOSCOPY WITH PROPOFOL N/A 06/13/2021   Procedure: COLONOSCOPY WITH PROPOFOL;  Surgeon: Rachael Fee, MD;  Location: [facility] ENDOSCOPY;  Service: Endoscopy;  Laterality: N/A;   POLYPECTOMY  06/13/2021   Procedure: POLYPECTOMY;  Surgeon: Rachael Fee, MD;  Location: [facility] ENDOSCOPY;   Service: Endoscopy;;   TONSILLECTOMY  1982   TOTAL ABDOMINAL HYSTERECTOMY     Social History:  reports that she has never smoked. She has never used smokeless tobacco. She reports that she does not drink alcohol and does not use drugs. Family History:  Family History  Problem Relation Age of Onset   Heart disease Father    Hypertension Father    Breast cancer Sister    Cancer Sister        breast   Colon cancer Neg Hx    Esophageal cancer Neg Hx    Pancreatic cancer Neg Hx    Liver disease Neg Hx      HOME MEDICATIONS: Allergies as of 10/04/2021       Reactions   Doxycycline    Made patient feel very ill    Metformin Diarrhea        Medication List        Accurate as of October 04, 2021 10:41 AM. If you have any questions, ask your nurse or doctor.          Accu-Chek Softclix Lancets lancets 1 each by Other route 3 (three) times daily. as directed   allopurinol 300 MG tablet Commonly known as: ZYLOPRIM TAKE 1 TABLET(300 MG) BY MOUTH DAILY   amiodarone 200 MG tablet Commonly known as: PACERONE Take 1 tablet (200 mg total) by mouth daily.   amoxicillin-clavulanate 875-125 MG tablet Commonly known as: AUGMENTIN Take 1 tablet by mouth 2 (two) times daily.   blood glucose meter kit and supplies Dispense based on patient and insurance preference. Use up to four times daily as directed. (FOR ICD-10 E10.9, E11.9).   Breztri Aerosphere 160-9-4.8 MCG/ACT Aero Generic drug: Budeson-Glycopyrrol-Formoterol Inhale 2 puffs into the lungs in the morning and at bedtime.   Breztri Aerosphere 160-9-4.8 MCG/ACT Aero Generic drug: Budeson-Glycopyrrol-Formoterol Inhale 2 puffs into the lungs in the morning and at bedtime.   cloNIDine 0.3 MG tablet Commonly known as: CATAPRES Take 1 tablet (0.3 mg total) by mouth 2 (two) times daily.   cyclobenzaprine 5 MG tablet Commonly known as: FLEXERIL Take 1 tablet (5 mg total) by mouth as needed for muscle spasms.    diltiazem 30 MG tablet Commonly known as: CARDIZEM Take 1 tablet (30 mg total) by mouth See admin instructions. Take one tablet every 4 hours as needed for heart rate greater than 100   Eliquis 5 MG Tabs tablet Generic drug: apixaban TAKE 1 TABLET(5 MG) BY MOUTH TWICE DAILY   FreeStyle Libre 14 Day Reader Hardie Pulley Use as directed.   FreeStyle Libre 14 Day Sensor Misc Use as directed to check blood sugar daily   HYDROcodone bit-homatropine 5-1.5 MG/5ML syrup Commonly known as: HYCODAN Take 5 mLs by mouth at bedtime  as needed for cough.   Insulin Lispro Prot & Lispro (75-25) 100 UNIT/ML Kwikpen Commonly known as: HumaLOG Mix 75/25 KwikPen Inject 17 Units into the skin 2 (two) times daily.   levothyroxine 75 MCG tablet Commonly known as: SYNTHROID Take 1 tablet (75 mcg total) by mouth daily before breakfast.   lidocaine 5 % Commonly known as: Lidoderm Place 1 patch onto the skin daily. Remove & Discard patch within 12 hours or as directed by MD What changed:  when to take this reasons to take this   linaclotide 145 MCG Caps capsule Commonly known as: Linzess Take 1 capsule (145 mcg total) by mouth daily before breakfast.   loperamide 2 MG capsule Commonly known as: IMODIUM Take 1 capsule (2 mg total) by mouth 4 (four) times daily as needed for diarrhea or loose stools.   losartan 100 MG tablet Commonly known as: COZAAR TAKE 1 TABLET BY MOUTH  DAILY   Na Sulfate-K Sulfate-Mg Sulf 17.5-3.13-1.6 GM/177ML Soln Commonly known as: Suprep Bowel Prep Kit Take 1 kit by mouth as directed.   NovoFine Plus Pen Needle 32G X 4 MM Misc Generic drug: Insulin Pen Needle USE TWICE DAILY AS DIRECTED What changed:  how much to take how to take this when to take this additional instructions   Olopatadine HCl 0.2 % Soln Place 1 drop into the right eye daily.   OneTouch Verio test strip Generic drug: glucose blood 1 each by Other route 3 (three) times daily.   Ozempic (1 MG/DOSE)  4 MG/3ML Sopn Generic drug: Semaglutide (1 MG/DOSE) Inject 1 mg into the skin once a week.   potassium chloride 10 MEQ tablet Commonly known as: KLOR-CON Take 2 tablets (20 mEq total) by mouth daily.   Restasis MultiDose 0.05 % ophthalmic emulsion Generic drug: cycloSPORINE Place 1 drop into both eyes 2 (two) times daily.   rosuvastatin 10 MG tablet Commonly known as: CRESTOR TAKE 1 TABLET BY MOUTH IN  THE EVENING   torsemide 20 MG tablet Commonly known as: DEMADEX Take 1 tablet (20 mg total) by mouth daily.   triamcinolone cream 0.1 % Commonly known as: KENALOG Apply 1 application topically 2 (two) times daily.   verapamil 120 MG tablet Commonly known as: Calan Take 1 tablet (120 mg total) by mouth in the morning.         OBJECTIVE:   Vital Signs: BP 140/90 (BP Location: Left Arm, Patient Position: Sitting, Cuff Size: Large)    Pulse 79    Ht 5\' 1"  (1.549 m)    Wt 241 lb (109.3 kg)    SpO2 95%    BMI 45.54 kg/m   Wt Readings from Last 3 Encounters:  10/04/21 241 lb (109.3 kg)  10/01/21 242 lb (109.8 kg)  09/30/21 241 lb (109.3 kg)     Exam: General: Pt appears well and is in NAD  Lungs: Clear with good BS bilat with no rales, rhonchi, or wheezes  Heart: Irregular HR  Extremities: No pretibial edema.   Neuro: MS is good with appropriate affect, pt is alert and Ox3    DM foot exam: 06/21/2020     The skin of the feet is intact without sores or ulcerations. The pedal pulses are 2+ on right and 2+ on left. The sensation is intact to a screening 5.07, 10 gram monofilament bilaterally         DATA REVIEWED:  Lab Results  Component Value Date   HGBA1C 8.6 (H) 08/29/2021   HGBA1C 7.8 (  A) 05/24/2021   HGBA1C 7.4 (A) 11/29/2020    Latest Reference Range & Units 08/23/21 09:32  Sodium 135 - 145 mEq/L 140  Potassium 3.5 - 5.1 mEq/L 3.4 (L)  Chloride 96 - 112 mEq/L 98  CO2 19 - 32 mEq/L 35 (H)  Glucose 70 - 99 mg/dL 657 (H)  BUN 6 - 23 mg/dL 12   Creatinine 8.46 - 1.20 mg/dL 9.62  Calcium 8.4 - 95.2 mg/dL 9.1  Alkaline Phosphatase 39 - 117 U/L 126 (H)  Albumin 3.5 - 5.2 g/dL 4.0  Lipase 84.1 - 32.4 U/L 17.0  AST 0 - 37 U/L 23  ALT 0 - 35 U/L 20  Total Protein 6.0 - 8.3 g/dL 7.2  Total Bilirubin 0.2 - 1.2 mg/dL 0.6  GFR >40.10 mL/min 75.34    Latest Reference Range & Units 08/29/21 12:11  Total CHOL/HDL Ratio  2  Cholesterol 0 - 200 mg/dL 272  HDL Cholesterol >53.66 mg/dL 44.03  LDL (calc) 0 - 99 mg/dL 51  NonHDL  47.42  Triglycerides 0.0 - 149.0 mg/dL 595.6  VLDL 0.0 - 38.7 mg/dL 56.4      ASSESSMENT / PLAN / RECOMMENDATIONS:   1) Type 2 Diabetes Mellitus, Poorly  controlled, With Neuropathic  complications - Most recent A1c of 8.6 %. Goal A1c < 7.0 %.     -Patient has been noted with hyperglycemia, she did contact the office while being on prednisone, her insulin has been adjusted appropriately and has noted improvement in her glycemic control over the past few days. -She has completed her prednisone taper, will reduce her insulin preemptively to prevent hypoglycemia -This morning she had coffee and a muffin without any insulin coverage, in office BG 164 mg/DL  - Intolerant to Metformin    MEDICATIONS: -Decrease Humalog Mix 25 units with Breakfast and 25 units with Supper  -Continue Ozempic 1 mg weekly    EDUCATION / INSTRUCTIONS: BG monitoring instructions: Patient is instructed to check her blood sugars 3 times a day, before meals . Call [facility] if: BG persistently < 70 I reviewed the Rule of 15 for the treatment of hypoglycemia in detail with the patient. Literature supplied.    2) Diabetic complications:  Eye: Does not have known diabetic retinopathy.  Neuro/ Feet: Does have known diabetic peripheral neuropathy .  Renal: Patient does not have known baseline CKD. She   is on an ACEI/ARB at present.     F/U in 4 months    Signed electronically by: Lyndle Herrlich,  MD  [facility]  [facility] [address] [place] [place], [place] [address] Phone: [phone] FAX: [phone]   CC: Georgina Quint, MD [address] [place] [address] Phone: [phone]  Fax: [phone]  Return to Endocrinology clinic as below: Future Appointments  Date Time Provider Department Center  10/04/2021 10:50 AM Sharleen Szczesny, Konrad Dolores, MD [facility] None  10/07/2021  9:30 AM Jetty Duhamel D, MD [facility] None  10/08/2021  9:45 AM Freddie Breech, DPM [facility]  10/09/2021  3:30 PM [facility] CARE [facility] None  11/18/2021  3:30 PM [facility] CCM PHARMACIST 2 [facility] None  12/13/2021 11:30 AM Jetty Duhamel D, MD [facility] None  12/23/2021  9:00 AM [facility] 1 [facility]  01/02/2022  1:30 PM Young, Rennis Chris, MD [facility] None

## 2021-10-04 NOTE — Telephone Encounter (Signed)
Called and spoke with patient who states that she wants to let CY know that she didn't sleep more than 2 hours last night and that her husband woke her up 4 times because she was snoring and she had her cpap machine on. States this is very unusual for her.     Dr. Annamaria Boots Please advise

## 2021-10-07 ENCOUNTER — Encounter (HOSPITAL_COMMUNITY): Payer: Self-pay | Admitting: Cardiology

## 2021-10-07 ENCOUNTER — Ambulatory Visit: Payer: Medicare Other | Admitting: Internal Medicine

## 2021-10-08 ENCOUNTER — Ambulatory Visit (INDEPENDENT_AMBULATORY_CARE_PROVIDER_SITE_OTHER): Payer: Medicare Other | Admitting: Podiatry

## 2021-10-08 ENCOUNTER — Encounter: Payer: Self-pay | Admitting: Podiatry

## 2021-10-08 ENCOUNTER — Other Ambulatory Visit: Payer: Self-pay

## 2021-10-08 DIAGNOSIS — M79674 Pain in right toe(s): Secondary | ICD-10-CM | POA: Diagnosis not present

## 2021-10-08 DIAGNOSIS — B351 Tinea unguium: Secondary | ICD-10-CM | POA: Diagnosis not present

## 2021-10-08 DIAGNOSIS — M79675 Pain in left toe(s): Secondary | ICD-10-CM | POA: Diagnosis not present

## 2021-10-08 DIAGNOSIS — E1142 Type 2 diabetes mellitus with diabetic polyneuropathy: Secondary | ICD-10-CM | POA: Diagnosis not present

## 2021-10-08 DIAGNOSIS — Z794 Long term (current) use of insulin: Secondary | ICD-10-CM | POA: Diagnosis not present

## 2021-10-08 NOTE — Telephone Encounter (Signed)
Called and spoke with patient who states that she came to the office on Monday and was told that she didn't have an appt and states that she had her machine with her. Advised her that I spoke with her on Friday and we canceled the appt for Monday  10/07/21 since we had just seen her on 09/30/21. She states that she did not remember. She is scheduled for an appt in February. Advised her that if she has any issues before then someone is going to need to bring her SD card or machine up here  for [place] to get a current download. She expressed understanding. Nothing further needed at this time.

## 2021-10-09 ENCOUNTER — Ambulatory Visit (INDEPENDENT_AMBULATORY_CARE_PROVIDER_SITE_OTHER): Payer: Medicare Other | Admitting: *Deleted

## 2021-10-09 DIAGNOSIS — I5032 Chronic diastolic (congestive) heart failure: Secondary | ICD-10-CM

## 2021-10-09 DIAGNOSIS — I480 Paroxysmal atrial fibrillation: Secondary | ICD-10-CM

## 2021-10-09 DIAGNOSIS — E1142 Type 2 diabetes mellitus with diabetic polyneuropathy: Secondary | ICD-10-CM

## 2021-10-10 NOTE — Patient Instructions (Signed)
Visit Information  [name], thank you for taking time to talk with me today. Please don't hesitate to contact me if I can be of assistance to you before our next scheduled telephone appointment.  Below are the goals we discussed today:  Patient Self-Care Activities: [name] will: Take medications as prescribed Attend all scheduled provider appointments Call pharmacy for medication refills Call provider office for new concerns or questions Continue to check fasting (first thing in the morning, before eating) and 2-hours AFTER eating blood sugars at home-- this will help your doctors know if your medications are working or if they need adjusting: we will review these values each time we talk over the phone  Continue to monitor and write down on paper your morning weights every day: if you gain more than 3 lbs overnight, or 5 lbs in one week: please call your cardiology provider for instructions as to what you should do Watch for swelling in feet, ankles and legs every day Wear the compression stockings that the cardiology staff recommended on days that you notice swelling in your feet/ ankles Continue to follow heart healthy, low salt, low cholesterol, carbohydrate-modified, low sugar diet Try to stay as active as possible  Our next scheduled telephone follow up visit/ appointment with care management team member is scheduled on:  Tuesday, October 22, 2021 at 3:15 pm  If you need to cancel or re-schedule our visit, please call [phone] and our care guide team will be happy to assist you.   I look forward to hearing about your progress.   [name], RN, BSN, [place] [phone]: direct office  If you are experiencing a Mental Health or [place] or need someone to talk to, please  call the Suicide and Crisis Lifeline: 988 call the [place] National Suicide Prevention Lifeline: (951)388-8550 or TTY:  (808)600-7382 TTY 704-563-2249) to talk to a trained counselor call 1-800-273-TALK (toll free, 24 hour hotline) go to [facility] [address] [place] [phone]) call 911   The patient verbalized understanding of instructions, educational materials, and care plan provided today and agreed to receive a mailed copy of patient instructions, educational materials, and care plan  Electrical Cardioversion Electrical cardioversion is the delivery of a jolt of electricity to restore a normal rhythm to the heart. A rhythm that is too fast or is not regular keeps the heart from pumping well. In this procedure, sticky patches or metal paddles are placed on the chest to deliver electricity to the heart from a device. This procedure may be done in an emergency if: There is low or no blood pressure as a result of the heart rhythm. Normal rhythm must be restored as fast as possible to protect the brain and heart from further damage. It may save a life. This may also be a scheduled procedure for irregular or fast heart rhythms that are not immediately life-threatening. Tell a health care provider about: Any allergies you have. All medicines you are taking, including vitamins, herbs, eye drops, creams, and over-the-counter medicines. Any problems you or family members have had with anesthetic medicines. Any blood disorders you have. Any surgeries you have had. Any medical conditions you have. Whether you are pregnant or may be pregnant. What are the risks? Generally, this is a safe procedure. However, problems may occur, including: Allergic reactions to medicines. A blood clot that breaks free and travels to other parts of your  body. The possible return of an abnormal heart rhythm within hours or days after the procedure. Your heart stopping (cardiac arrest). This is rare. What happens before the procedure? Medicines Your health care provider may have you  start taking: Blood-thinning medicines (anticoagulants) so your blood does not clot as easily. Medicines to help stabilize your heart rate and rhythm. Ask your health care provider about: Changing or stopping your regular medicines. This is especially important if you are taking diabetes medicines or blood thinners. Taking medicines such as aspirin and ibuprofen. These medicines can thin your blood. Do not take these medicines unless your health care provider tells you to take them. Taking over-the-counter medicines, vitamins, herbs, and supplements. General instructions Follow instructions from your health care provider about eating or drinking restrictions. Plan to have someone take you home from the hospital or clinic. If you will be going home right after the procedure, plan to have someone with you for 24 hours. Ask your health care provider what steps will be taken to help prevent infection. These may include washing your skin with a germ-killing soap. What happens during the procedure?  An IV will be inserted into one of your veins. Sticky patches (electrodes) or metal paddles may be placed on your chest. You will be given a medicine to help you relax (sedative). An electrical shock will be delivered. The procedure may vary among health care providers and hospitals. What can I expect after the procedure? Your blood pressure, heart rate, breathing rate, and blood oxygen level will be monitored until you leave the hospital or clinic. Your heart rhythm will be watched to make sure it does not change. You may have some redness on the skin where the shocks were given. Follow these instructions at home: Do not drive for 24 hours if you were given a sedative during your procedure. Take over-the-counter and prescription medicines only as told by your health care provider. Ask your health care provider how to check your pulse. Check it often. Rest for 48 hours after the procedure or as told  by your health care provider. Avoid or limit your caffeine use as told by your health care provider. Keep all follow-up visits as told by your health care provider. This is important. Contact a health care provider if: You feel like your heart is beating too quickly or your pulse is not regular. You have a serious muscle cramp that does not go away. Get help right away if: You have discomfort in your chest. You are dizzy or you feel faint. You have trouble breathing or you are short of breath. Your speech is slurred. You have trouble moving an arm or leg on one side of your body. Your fingers or toes turn cold or blue. Summary Electrical cardioversion is the delivery of a jolt of electricity to restore a normal rhythm to the heart. This procedure may be done right away in an emergency or may be a scheduled procedure if the condition is not an emergency. Generally, this is a safe procedure. After the procedure, check your pulse often as told by your health care provider. This information is not intended to replace advice given to you by your health care provider. Make sure you discuss any questions you have with your health care provider. Document Revised: 05/09/2019 Document Reviewed: 05/09/2019 Elsevier Patient Education  [place].

## 2021-10-10 NOTE — Chronic Care Management (AMB) (Signed)
Chronic Care Management   CCM RN Visit Note  10/10/2021 Name: [name] MRN: [id number] DOB: Mar 17, 1941  Subjective: [name] is a 80 y.o. year old female who is a primary care patient of Sagardia, Ines Bloomer, MD. The care management team was consulted for assistance with disease management and care coordination needs.    Engaged with patient by telephone for follow up visit in response to provider referral for case management and/or care coordination services.   Consent to Services:  The patient was given information about Chronic Care Management services, agreed to services, and gave verbal consent prior to initiation of services.  Please see initial visit note for detailed documentation.  Patient agreed to services and verbal consent obtained.   Assessment: Review of patient past medical history, allergies, medications, health status, including review of consultants reports, laboratory and other test data, was performed as part of comprehensive evaluation and provision of chronic care management services.   CCM Care Plan Allergies  Allergen Reactions   Doxycycline     Made patient feel very ill    Metformin Diarrhea   Outpatient Encounter Medications as of 10/09/2021  Medication Sig   Accu-Chek Softclix Lancets lancets 1 each by Other route 3 (three) times daily. as directed   allopurinol (ZYLOPRIM) 300 MG tablet TAKE 1 TABLET(300 MG) BY MOUTH DAILY   amiodarone (PACERONE) 200 MG tablet Take 1 tablet (200 mg total) by mouth daily.   amoxicillin-clavulanate (AUGMENTIN) 875-125 MG tablet Take 1 tablet by mouth 2 (two) times daily.   blood glucose meter kit and supplies Dispense based on patient and insurance preference. Use up to four times daily as directed. (FOR ICD-10 E10.9, E11.9).   Budeson-Glycopyrrol-Formoterol (BREZTRI AEROSPHERE) 160-9-4.8 MCG/ACT AERO Inhale 2 puffs into the lungs in the morning and at bedtime.   Budeson-Glycopyrrol-Formoterol (BREZTRI  AEROSPHERE) 160-9-4.8 MCG/ACT AERO Inhale 2 puffs into the lungs in the morning and at bedtime.   cloNIDine (CATAPRES) 0.3 MG tablet Take 1 tablet (0.3 mg total) by mouth 2 (two) times daily.   Continuous Blood Gluc Receiver (FREESTYLE LIBRE 14 DAY READER) DEVI Use as directed.   Continuous Blood Gluc Sensor (FREESTYLE LIBRE 14 DAY SENSOR) MISC Use as directed to check blood sugar daily   cyclobenzaprine (FLEXERIL) 5 MG tablet Take 1 tablet (5 mg total) by mouth as needed for muscle spasms.   diltiazem (CARDIZEM) 30 MG tablet Take 1 tablet (30 mg total) by mouth See admin instructions. Take one tablet every 4 hours as needed for heart rate greater than 100   ELIQUIS 5 MG TABS tablet TAKE 1 TABLET(5 MG) BY MOUTH TWICE DAILY   HYDROcodone bit-homatropine (HYCODAN) 5-1.5 MG/5ML syrup Take 5 mLs by mouth at bedtime as needed for cough.   Insulin Lispro Prot & Lispro (HUMALOG MIX 75/25 KWIKPEN) (75-25) 100 UNIT/ML Kwikpen Inject 25 Units into the skin 2 (two) times daily.   levothyroxine (SYNTHROID) 75 MCG tablet Take 1 tablet (75 mcg total) by mouth daily before breakfast.   lidocaine (LIDODERM) 5 % Place 1 patch onto the skin daily. Remove & Discard patch within 12 hours or as directed by MD (Patient taking differently: Place 1 patch onto the skin daily as needed (Pain). Remove & Discard patch within 12 hours or as directed by MD)   linaclotide (LINZESS) 145 MCG CAPS capsule Take 1 capsule (145 mcg total) by mouth daily before breakfast.   loperamide (IMODIUM) 2 MG capsule Take 1 capsule (2 mg total) by mouth 4 (four) times  daily as needed for diarrhea or loose stools.   losartan (COZAAR) 100 MG tablet TAKE 1 TABLET BY MOUTH  DAILY   Na Sulfate-K Sulfate-Mg Sulf (SUPREP BOWEL PREP KIT) 17.5-3.13-1.6 GM/177ML SOLN Take 1 kit by mouth as directed.   NOVOFINE PLUS PEN NEEDLE 32G X 4 MM MISC USE TWICE DAILY AS DIRECTED (Patient taking differently: 1 each by Other route as directed.)   Olopatadine HCl 0.2 %  SOLN Place 1 drop into the right eye daily.   ONETOUCH VERIO test strip 1 each by Other route 3 (three) times daily.   potassium chloride (KLOR-CON) 10 MEQ tablet Take 2 tablets (20 mEq total) by mouth daily.   RESTASIS MULTIDOSE 0.05 % ophthalmic emulsion Place 1 drop into both eyes 2 (two) times daily.    rosuvastatin (CRESTOR) 10 MG tablet TAKE 1 TABLET BY MOUTH IN  THE EVENING   Semaglutide, 1 MG/DOSE, (OZEMPIC, 1 MG/DOSE,) 4 MG/3ML SOPN Inject 1 mg into the skin once a week.   torsemide (DEMADEX) 20 MG tablet Take 1 tablet (20 mg total) by mouth daily.   triamcinolone cream (KENALOG) 0.1 % Apply 1 application topically 2 (two) times daily.   verapamil (CALAN) 120 MG tablet Take 1 tablet (120 mg total) by mouth in the morning.   No facility-administered encounter medications on file as of 10/09/2021.   Patient Active Problem List   Diagnosis Date Noted   Lung nodule 09/30/2021   Cough 09/27/2021   Moderate asthma with acute exacerbation 09/27/2021   Pulmonary edema 09/27/2021   Bruising at injection site 08/03/2021   Abdominal pain 08/03/2021   CAP (community acquired pneumonia) 07/17/2021   Chills 07/17/2021   Visual hallucination 07/17/2021   Dysthymia 03/12/2021   Dysuria 03/01/2021   Primary osteoarthritis involving multiple joints 03/01/2021   Recurrent falls 01/10/2021   Sensorineural hearing loss (SNHL), 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   Persistent atrial fibrillation ([place]) 08/12/2020   Type 2 diabetes mellitus with hyperglycemia, with long-term current use of insulin ([place]) 06/21/2020   Type 2 diabetes mellitus with diabetic polyneuropathy, with long-term current use of insulin ([place]) 06/21/2020   Mixed hyperlipidemia 06/21/2020   Uncontrolled hypertension 06/11/2020   Aortic atherosclerosis ([place]) 03/28/2020   Diabetic peripheral neuropathy ([place]) 12/26/2019   Gastroesophageal reflux disease without esophagitis 03/17/2019   Bilateral leg edema 02/16/2019   Pruritus 02/16/2019    Varicose veins of both legs with edema 02/16/2019   Heart murmur 09/05/2018   Restrictive lung disease 02/22/2018   Dysgeusia 06/11/2017   Osteoarthritis of left knee 06/03/2017   NAFLD (nonalcoholic fatty liver disease) 08/07/2016   Osteoporosis 12/31/2015   Obesity 12/19/2015   Hypersomnia 12/19/2015   Edema 10/18/2015   Acquired hypothyroidism 10/18/2015   Dyspnea 82/50/0370   Acute diastolic congestive heart failure, NYHA class 3 ([place]) 10/03/2013   Prolonged QT interval 48/88/9169   Acute diastolic heart failure ([place]) 10/03/2013   Long term (current) use of anticoagulants 06/09/2012   BENIGN NEOPLASM OF ADRENAL GLAND 11/25/2010   Chronic diastolic heart failure ([place]) 02/20/2010   DM 05/16/2009   GOUT 05/16/2009   OBESITY, MORBID 05/16/2009   Hypertension associated with diabetes ([place]) 05/16/2009   Paroxysmal atrial fibrillation ([place]) 05/16/2009   HYPERLIPIDEMIA 11/30/2008   Obstructive sleep apnea on CPAP 11/30/2008   Conditions to be addressed/monitored:  Atrial Fibrillation, CHF, and DMII  Care Plan : Heart Failure (Adult)  Updates made by Knox Royalty, RN since 10/10/2021 12:00 AM     Problem: Symptom Exacerbation (Heart Failure)  Priority: Medium     Long-Range Goal: Symptom Exacerbation Prevented or Minimized   Start Date: 03/25/2021  Expected End Date: 03/25/2022  Recent Progress: On track  Priority: Medium  Note:   Current Barriers:  Knowledge deficit related to basic heart failure pathophysiology and self care management- has general baseline knowledge; will require ongoing reinforcement of same Knowledge Deficits related to heart failure medications- needs thorough review/ optimization of medications: CCM Pharmacy referral placed 08/16/21: [place] team active Does not adhere to provider recommendations re: daily weight monitoring/ recording: agreeable to resume this practice: 08/16/21-- reports has been more adherent with daily weight monitoring Case  Manager Clinical Goal(s):  Over the next 12 months, patient will complete the following as evidenced by patient reporting during CCM RN CM outreach of: verbalize understanding of Heart Failure Action Plan and when to call doctor take all Heart Failure mediations as prescribed monitor/ record daily weights at home (notifying MD of 3 lb weight gain over night or 5 lb in a week) Interventions:  Collaboration with Horald Pollen, MD regarding development and update of comprehensive plan of care as evidenced by provider attestation and co-signature Inter-disciplinary care team collaboration (see longitudinal plan of care) Chart reviewed including relevant office notes, upcoming scheduled appointments, and lab results 10/09/21- Goal completed due to duplication of goals, progression of care plan with conversion to new format; goals re-established and extended     Care Plan : Diabetes Type 2 (Adult)  Updates made by Knox Royalty, RN since 10/10/2021 12:00 AM     Problem: Glycemic Management (Diabetes, Type 2)   Priority: Medium     Long-Range Goal: Glycemic Management Optimized   Start Date: 03/25/2021  Expected End Date: 03/25/2022  Recent Progress: On track  Priority: Medium  Note:   Current Barriers:  Knowledge Deficits related to basic Diabetes pathophysiology and self care/management- has fair baseline understanding, could benefit from ongoing reinforcement of same Does not know how to utilize history function of CGM- will require ongoing reinforcement of same  Case Manager Clinical Goal(s):  Over the next 12 months, patient will demonstrate improved adherence to prescribed treatment plan for diabetes self care/management as evidenced by patient reporting during CCM RN CM outreach of:   daily monitoring and recording of blood sugars at home using CGM and glucometer, as indicated   adherence to ADA/ carb modified/ low sugar diet  adherence to prescribed medication  regimen Interventions:  Collaboration with Horald Pollen, MD regarding development and update of comprehensive plan of care as evidenced by provider attestation and co-signature Inter-disciplinary care team collaboration (see longitudinal plan of care) Review of patient status, including review of consultants reports, relevant laboratory and other test results, and medications completed Chart reviewed including relevant office notes, upcoming scheduled appointments, and lab results 10/09/21- Goal completed due to duplication of goals, progression of care plan with conversion to new format; goals re-established and extended     Care Plan : [place] of Care  Updates made by Knox Royalty, RN since 10/10/2021 12:00 AM     Problem: Chronic Disease Management Needs   Priority: High     Long-Range Goal: Ongoing adherence to established care plan for long term chronic disease management   Start Date: 10/09/2021  Expected End Date: 10/09/2022  Priority: High  Note:   Current Barriers:  Chronic Disease Management support and education needs related to Atrial Fibrillation, CHF, and DMII Fragile state of health, multiple progressing chronic health  conditions-- requires ongoing reinforcement/ support for self-health management of multiple co-morbidities  RNCM Clinical Goal(s):  Patient will demonstrate ongoing health management independence as evidenced by adherence to established plan of care for CHF/ AF, and DMII        through collaboration with RN Care manager, provider, and care team.   Interventions: 1:1 collaboration with primary care provider regarding development and update of comprehensive plan of care as evidenced by provider attestation and co-signature Inter-disciplinary care team collaboration (see longitudinal plan of care) Evaluation of current treatment plan related to  self management and patient's adherence to plan as established by provider 10/09/21-  Previous goals completed due to duplication/ progression of care plan with conversion to new format; goals re-established and extended  Heart Failure Interventions:  (Status: Goal on Track (progressing): YES.)  Long Term Goal  Basic overview and discussion of pathophysiology of Heart Failure reviewed Discussed importance of daily weight and advised patient to weigh and record daily Discussed the importance of keeping all appointments with provider Reviewed recent weights at home: reports general weights between 241-245; reports weight this morning of "241 lbs:" this represents a small decrease in baseline; she denies signs/ symptoms CHF yellow zone Confirmed patient has resumed using CPAP- rpeorts tolerating well, denies concerns Confirmed patient continues to follow low sodium heart healthy diet Reinforced previously provided education around signs/ symptoms yellow CHF zone, along with corresponding action plan: she verbalizes good understanding of same and will benefit from ongoing reinforcement and support of CHF self-health management Reviewed multiple recent provider office visits: 08/29/21 and 09/18/21- PCP; URI/ pneumonia 09/27/21- pulmonary provider: ongoing URI/ pneumonia; antibiotics/ prednisone/ imaging: reports took antibiotics/ prednisone as prescribed; all doses now complete: unfortunately, a (R) lung nodule was discovered by imaging during this visit; recommended follow up imaging in 3 months 09/30/21- pulmonary provider follow up visit: patient improving; reports obtained new CPAP, using 10/01/21- cardiology provider: noted to be in persistent A-Fib; cardioversion scheduled for 10/18/21 10/04/21- endocrinology provider: insulin dosing increased to compensate for increased blood sugars, presumably due to recent prednisone therapy Discussed/ provided education/ support for upcoming scheduled cardioversion: we reviewed signs/ symptoms A-Fib, along with corresponding action plan for  changes in rate/ symptoms; patient tolerating without current issues/ concerns; will send additional educational material around cardioversion-- patient has had in remote past, is nervous about procedure- emotional support/ information provided with encouragement that she should feel better once back in NS[place]  Diabetes:  (Status: Goal on Track (progressing): YES.) Long Term Goal   Lab Results  Component Value Date   HGBA1C 8.6 (H) 08/29/2021  Assessed patient's understanding of A1c goal: <7% Counseled on importance of regular laboratory monitoring as prescribed;        Discussed plans with patient for ongoing care management follow up and provided patient with direct contact information for care management team;      Reviewed scheduled/upcoming provider appointments including: 10/18/21- cardioversion; 11/15/21- follow up cardiology provider, post- cardiversion; 12/13/21- pulmonary provider ;         Review of patient status, including review of consultants reports, relevant laboratory and other test results, and medications completed;       Confirmed continues "trying" to follow carb-modified, low sugar diet Reviewed recent blood sugars at home-- they have been increased due to recent prednisone therapy; reviewed recent changes to insulin dosing post- recent endocrinology provider office visit 10/04/21: patient confirms she is taking between 17-25 units as directed  by endocrinology; reports blood sugars slowly decreasing now that  prednisone is completed; she reports a post-prandial blood sugar this afternoon as "125" Reinforced previously provided education around signs/ symptoms hypoglycemia along with corresponding action plan: patient continues to benefit form ongoing reinforcement/ support   Patient Goals/Self-Care Activities: As evidenced by review of EHR, collaboration with care team, and patient reporting during CCM RN CM outreach,   Patient Margorie will: Take medications as prescribed Attend  all scheduled provider appointments Call pharmacy for medication refills Call provider office for new concerns or questions Continue to check fasting (first thing in the morning, before eating) and 2-hours AFTER eating blood sugars at home-- this will help your doctors know if your medications are working or if they need adjusting: we will review these values each time we talk over the phone  Continue to monitor and write down on paper your morning weights every day: if you gain more than 3 lbs overnight, or 5 lbs in one week: please call your cardiology provider for instructions as to what you should do Watch for swelling in feet, ankles and legs every day Wear the compression stockings that the cardiology staff recommended on days that you notice swelling in your feet/ ankles Continue to follow heart healthy, low salt, low cholesterol, carbohydrate-modified, low sugar diet Try to stay as active as possible               Plan: Telephone follow up appointment with care management team member scheduled for:  Tuesday, October 22, 2021 at 3:15 pm The patient has been provided with contact information for the care management team and has been advised to call with any health related questions or concerns  [name], RN, BSN, [place] [phone]: direct office

## 2021-10-11 DIAGNOSIS — J984 Other disorders of lung: Secondary | ICD-10-CM | POA: Diagnosis not present

## 2021-10-11 DIAGNOSIS — G4733 Obstructive sleep apnea (adult) (pediatric): Secondary | ICD-10-CM | POA: Diagnosis not present

## 2021-10-11 DIAGNOSIS — D649 Anemia, unspecified: Secondary | ICD-10-CM | POA: Diagnosis not present

## 2021-10-11 DIAGNOSIS — I358 Other nonrheumatic aortic valve disorders: Secondary | ICD-10-CM | POA: Diagnosis not present

## 2021-10-11 DIAGNOSIS — I5033 Acute on chronic diastolic (congestive) heart failure: Secondary | ICD-10-CM | POA: Diagnosis not present

## 2021-10-11 DIAGNOSIS — I4891 Unspecified atrial fibrillation: Secondary | ICD-10-CM | POA: Diagnosis not present

## 2021-10-11 DIAGNOSIS — E032 Hypothyroidism due to medicaments and other exogenous substances: Secondary | ICD-10-CM | POA: Diagnosis not present

## 2021-10-11 DIAGNOSIS — R590 Localized enlarged lymph nodes: Secondary | ICD-10-CM | POA: Diagnosis not present

## 2021-10-11 DIAGNOSIS — I517 Cardiomegaly: Secondary | ICD-10-CM | POA: Diagnosis not present

## 2021-10-11 DIAGNOSIS — R0989 Other specified symptoms and signs involving the circulatory and respiratory systems: Secondary | ICD-10-CM | POA: Diagnosis not present

## 2021-10-11 DIAGNOSIS — J440 Chronic obstructive pulmonary disease with acute lower respiratory infection: Secondary | ICD-10-CM | POA: Diagnosis not present

## 2021-10-11 DIAGNOSIS — R918 Other nonspecific abnormal finding of lung field: Secondary | ICD-10-CM | POA: Diagnosis not present

## 2021-10-11 DIAGNOSIS — I5032 Chronic diastolic (congestive) heart failure: Secondary | ICD-10-CM | POA: Diagnosis not present

## 2021-10-11 DIAGNOSIS — I3481 Nonrheumatic mitral (valve) annulus calcification: Secondary | ICD-10-CM | POA: Diagnosis not present

## 2021-10-11 DIAGNOSIS — E1142 Type 2 diabetes mellitus with diabetic polyneuropathy: Secondary | ICD-10-CM | POA: Diagnosis not present

## 2021-10-11 DIAGNOSIS — E039 Hypothyroidism, unspecified: Secondary | ICD-10-CM | POA: Diagnosis not present

## 2021-10-11 DIAGNOSIS — Z881 Allergy status to other antibiotic agents status: Secondary | ICD-10-CM | POA: Diagnosis not present

## 2021-10-11 DIAGNOSIS — Z794 Long term (current) use of insulin: Secondary | ICD-10-CM | POA: Diagnosis not present

## 2021-10-11 DIAGNOSIS — E119 Type 2 diabetes mellitus without complications: Secondary | ICD-10-CM | POA: Diagnosis not present

## 2021-10-11 DIAGNOSIS — I480 Paroxysmal atrial fibrillation: Secondary | ICD-10-CM | POA: Diagnosis not present

## 2021-10-11 DIAGNOSIS — J069 Acute upper respiratory infection, unspecified: Secondary | ICD-10-CM | POA: Diagnosis not present

## 2021-10-11 DIAGNOSIS — I110 Hypertensive heart disease with heart failure: Secondary | ICD-10-CM | POA: Diagnosis not present

## 2021-10-11 DIAGNOSIS — J441 Chronic obstructive pulmonary disease with (acute) exacerbation: Secondary | ICD-10-CM | POA: Diagnosis not present

## 2021-10-11 DIAGNOSIS — I340 Nonrheumatic mitral (valve) insufficiency: Secondary | ICD-10-CM | POA: Diagnosis not present

## 2021-10-11 DIAGNOSIS — I10 Essential (primary) hypertension: Secondary | ICD-10-CM | POA: Diagnosis not present

## 2021-10-11 DIAGNOSIS — I083 Combined rheumatic disorders of mitral, aortic and tricuspid valves: Secondary | ICD-10-CM | POA: Diagnosis not present

## 2021-10-11 DIAGNOSIS — I440 Atrioventricular block, first degree: Secondary | ICD-10-CM | POA: Diagnosis not present

## 2021-10-11 DIAGNOSIS — T462X5A Adverse effect of other antidysrhythmic drugs, initial encounter: Secondary | ICD-10-CM | POA: Diagnosis not present

## 2021-10-11 DIAGNOSIS — J90 Pleural effusion, not elsewhere classified: Secondary | ICD-10-CM | POA: Diagnosis not present

## 2021-10-11 DIAGNOSIS — I5031 Acute diastolic (congestive) heart failure: Secondary | ICD-10-CM | POA: Diagnosis not present

## 2021-10-11 DIAGNOSIS — R911 Solitary pulmonary nodule: Secondary | ICD-10-CM | POA: Diagnosis not present

## 2021-10-11 DIAGNOSIS — I4819 Other persistent atrial fibrillation: Secondary | ICD-10-CM | POA: Diagnosis not present

## 2021-10-11 DIAGNOSIS — Z888 Allergy status to other drugs, medicaments and biological substances status: Secondary | ICD-10-CM | POA: Diagnosis not present

## 2021-10-11 DIAGNOSIS — R0789 Other chest pain: Secondary | ICD-10-CM | POA: Diagnosis not present

## 2021-10-11 DIAGNOSIS — M109 Gout, unspecified: Secondary | ICD-10-CM | POA: Diagnosis not present

## 2021-10-11 DIAGNOSIS — Z20822 Contact with and (suspected) exposure to covid-19: Secondary | ICD-10-CM | POA: Diagnosis not present

## 2021-10-12 DIAGNOSIS — I5033 Acute on chronic diastolic (congestive) heart failure: Secondary | ICD-10-CM | POA: Diagnosis not present

## 2021-10-12 DIAGNOSIS — J984 Other disorders of lung: Secondary | ICD-10-CM | POA: Diagnosis not present

## 2021-10-12 DIAGNOSIS — I517 Cardiomegaly: Secondary | ICD-10-CM | POA: Diagnosis not present

## 2021-10-12 DIAGNOSIS — I4891 Unspecified atrial fibrillation: Secondary | ICD-10-CM | POA: Diagnosis not present

## 2021-10-12 DIAGNOSIS — G4733 Obstructive sleep apnea (adult) (pediatric): Secondary | ICD-10-CM | POA: Diagnosis not present

## 2021-10-12 DIAGNOSIS — J90 Pleural effusion, not elsewhere classified: Secondary | ICD-10-CM | POA: Diagnosis not present

## 2021-10-13 DIAGNOSIS — J90 Pleural effusion, not elsewhere classified: Secondary | ICD-10-CM | POA: Diagnosis not present

## 2021-10-13 DIAGNOSIS — I5033 Acute on chronic diastolic (congestive) heart failure: Secondary | ICD-10-CM | POA: Diagnosis not present

## 2021-10-13 DIAGNOSIS — I517 Cardiomegaly: Secondary | ICD-10-CM | POA: Diagnosis not present

## 2021-10-13 DIAGNOSIS — I4891 Unspecified atrial fibrillation: Secondary | ICD-10-CM | POA: Diagnosis not present

## 2021-10-13 DIAGNOSIS — J984 Other disorders of lung: Secondary | ICD-10-CM | POA: Diagnosis not present

## 2021-10-13 DIAGNOSIS — R918 Other nonspecific abnormal finding of lung field: Secondary | ICD-10-CM | POA: Diagnosis not present

## 2021-10-13 DIAGNOSIS — G4733 Obstructive sleep apnea (adult) (pediatric): Secondary | ICD-10-CM | POA: Diagnosis not present

## 2021-10-14 DIAGNOSIS — I517 Cardiomegaly: Secondary | ICD-10-CM | POA: Diagnosis not present

## 2021-10-14 DIAGNOSIS — I4891 Unspecified atrial fibrillation: Secondary | ICD-10-CM | POA: Diagnosis not present

## 2021-10-14 DIAGNOSIS — J984 Other disorders of lung: Secondary | ICD-10-CM | POA: Diagnosis not present

## 2021-10-14 DIAGNOSIS — I083 Combined rheumatic disorders of mitral, aortic and tricuspid valves: Secondary | ICD-10-CM | POA: Diagnosis not present

## 2021-10-14 DIAGNOSIS — G4733 Obstructive sleep apnea (adult) (pediatric): Secondary | ICD-10-CM | POA: Diagnosis not present

## 2021-10-14 DIAGNOSIS — I5033 Acute on chronic diastolic (congestive) heart failure: Secondary | ICD-10-CM | POA: Diagnosis not present

## 2021-10-15 DIAGNOSIS — G4733 Obstructive sleep apnea (adult) (pediatric): Secondary | ICD-10-CM | POA: Diagnosis not present

## 2021-10-15 DIAGNOSIS — I10 Essential (primary) hypertension: Secondary | ICD-10-CM | POA: Diagnosis not present

## 2021-10-15 DIAGNOSIS — I5033 Acute on chronic diastolic (congestive) heart failure: Secondary | ICD-10-CM | POA: Diagnosis not present

## 2021-10-15 DIAGNOSIS — I4891 Unspecified atrial fibrillation: Secondary | ICD-10-CM | POA: Diagnosis not present

## 2021-10-15 DIAGNOSIS — J984 Other disorders of lung: Secondary | ICD-10-CM | POA: Diagnosis not present

## 2021-10-15 NOTE — Progress Notes (Signed)
Subjective: [name] is a 80 y.o. female patient seen today for follow up of  painful thick toenails that are difficult to trim. Pain interferes with ambulation. Aggravating factors include wearing enclosed shoe gear. Pain is relieved with periodic professional debridement.  New problems reported today: None. She states she has been applying diabetic foot cream to her dry skin.  Patient states their blood glucose was 160 mg/dl today.   PCP is Horald Pollen, MD. Last visit was: 09/18/2021. She sees Dr.Shameleffer for Endocrinology.  Patient states she is scheduled to have procedure performed for afib soon. She voices no new pedal concerns on today's visit.  Allergies  Allergen Reactions   Doxycycline     Made patient feel very ill    Metformin Diarrhea    Objective: Physical Exam  General: Patient is a pleasant 80 y.o. female morbidly obese in NAD. AAO x 3.   Neurovascular Examination: CFT immediate b/l LE. Palpable DP/PT pulses b/l LE. Digital hair present b/l. Skin temperature gradient WNL b/l. No pain with calf compression b/l. No edema noted b/l. No cyanosis or clubbing noted b/l LE.  Pt has subjective symptoms of neuropathy. Protective sensation intact 5/5 intact bilaterally with 10g monofilament b/l. Vibratory sensation intact b/l.  Dermatological:  Pedal integument with normal turgor, texture and tone b/l LE. No open wounds b/l. No interdigital macerations b/l. Toenails 1-5 b/l elongated, thickened, discolored with subungual debris. +Tenderness with dorsal palpation of nailplates. No hyperkeratotic or porokeratotic lesions present.  Musculoskeletal:  Muscle strength 4/5 to all lower extremity muscle groups bilaterally. Using office transport chair on today's visit. No pain, crepitus or joint limitation noted with ROM bilateral LE. HAV with bunion deformity noted b/l LE. Hammertoe deformity noted 2-5 b/l.  Assessment: 1. Pain due to onychomycosis of toenails of  both feet   2. Type 2 diabetes mellitus with diabetic polyneuropathy, with long-term current use of insulin ([place])     Plan: Patient was evaluated and treated and all questions answered. Consent given for treatment as described below: -Continue foot and shoe inspections daily. Monitor blood glucose per PCP/Endocrinologist's recommendations. -Mycotic toenails 1-5 bilaterally were debrided in length and girth with sterile nail nippers and dremel without incident. -Patient/POA to call should there be question/concern in the interim.  Return in about 3 months (around 01/06/2022).  Marzetta Board, DPM

## 2021-10-16 DIAGNOSIS — E039 Hypothyroidism, unspecified: Secondary | ICD-10-CM | POA: Diagnosis not present

## 2021-10-16 DIAGNOSIS — E119 Type 2 diabetes mellitus without complications: Secondary | ICD-10-CM | POA: Diagnosis not present

## 2021-10-16 DIAGNOSIS — I10 Essential (primary) hypertension: Secondary | ICD-10-CM | POA: Diagnosis not present

## 2021-10-16 DIAGNOSIS — I4891 Unspecified atrial fibrillation: Secondary | ICD-10-CM | POA: Diagnosis not present

## 2021-10-16 DIAGNOSIS — G4733 Obstructive sleep apnea (adult) (pediatric): Secondary | ICD-10-CM | POA: Diagnosis not present

## 2021-10-16 DIAGNOSIS — I4819 Other persistent atrial fibrillation: Secondary | ICD-10-CM | POA: Diagnosis not present

## 2021-10-17 ENCOUNTER — Encounter (HOSPITAL_COMMUNITY): Payer: Self-pay | Admitting: Certified Registered Nurse Anesthetist

## 2021-10-17 ENCOUNTER — Telehealth: Payer: Self-pay | Admitting: Internal Medicine

## 2021-10-17 DIAGNOSIS — I4891 Unspecified atrial fibrillation: Secondary | ICD-10-CM | POA: Diagnosis not present

## 2021-10-17 DIAGNOSIS — E039 Hypothyroidism, unspecified: Secondary | ICD-10-CM | POA: Diagnosis not present

## 2021-10-17 DIAGNOSIS — J90 Pleural effusion, not elsewhere classified: Secondary | ICD-10-CM | POA: Diagnosis not present

## 2021-10-17 DIAGNOSIS — I10 Essential (primary) hypertension: Secondary | ICD-10-CM | POA: Diagnosis not present

## 2021-10-17 DIAGNOSIS — D649 Anemia, unspecified: Secondary | ICD-10-CM | POA: Diagnosis not present

## 2021-10-17 DIAGNOSIS — J984 Other disorders of lung: Secondary | ICD-10-CM | POA: Diagnosis not present

## 2021-10-17 DIAGNOSIS — R918 Other nonspecific abnormal finding of lung field: Secondary | ICD-10-CM | POA: Diagnosis not present

## 2021-10-17 DIAGNOSIS — G4733 Obstructive sleep apnea (adult) (pediatric): Secondary | ICD-10-CM | POA: Diagnosis not present

## 2021-10-17 DIAGNOSIS — I5033 Acute on chronic diastolic (congestive) heart failure: Secondary | ICD-10-CM | POA: Diagnosis not present

## 2021-10-17 NOTE — Telephone Encounter (Signed)
this patient needs their cardio[facility] cancelled....[name]

## 2021-10-17 NOTE — Telephone Encounter (Signed)
Called and canceled cardioversion for patient that was schedule for tomorrow. Per Chart patient is at [facility] and had cardioversion yesterday.

## 2021-10-18 ENCOUNTER — Ambulatory Visit (HOSPITAL_COMMUNITY): Admission: RE | Admit: 2021-10-18 | Payer: Medicare Other | Source: Home / Self Care | Admitting: Cardiology

## 2021-10-18 ENCOUNTER — Other Ambulatory Visit: Payer: Self-pay

## 2021-10-18 DIAGNOSIS — M109 Gout, unspecified: Secondary | ICD-10-CM

## 2021-10-18 DIAGNOSIS — I5031 Acute diastolic (congestive) heart failure: Secondary | ICD-10-CM | POA: Diagnosis not present

## 2021-10-18 DIAGNOSIS — J984 Other disorders of lung: Secondary | ICD-10-CM | POA: Diagnosis not present

## 2021-10-18 DIAGNOSIS — E039 Hypothyroidism, unspecified: Secondary | ICD-10-CM | POA: Diagnosis not present

## 2021-10-18 DIAGNOSIS — D649 Anemia, unspecified: Secondary | ICD-10-CM | POA: Diagnosis not present

## 2021-10-18 DIAGNOSIS — I10 Essential (primary) hypertension: Secondary | ICD-10-CM | POA: Diagnosis not present

## 2021-10-18 DIAGNOSIS — I5033 Acute on chronic diastolic (congestive) heart failure: Secondary | ICD-10-CM | POA: Diagnosis not present

## 2021-10-18 DIAGNOSIS — G4733 Obstructive sleep apnea (adult) (pediatric): Secondary | ICD-10-CM | POA: Diagnosis not present

## 2021-10-18 DIAGNOSIS — I4891 Unspecified atrial fibrillation: Secondary | ICD-10-CM | POA: Diagnosis not present

## 2021-10-18 SURGERY — CARDIOVERSION
Anesthesia: General

## 2021-10-18 MED ORDER — ALLOPURINOL 300 MG PO TABS: ORAL_TABLET | ORAL | 3 refills | Status: DC

## 2021-10-18 NOTE — Telephone Encounter (Signed)
Refilled medication to [facility], received fax request.

## 2021-10-19 DIAGNOSIS — I5031 Acute diastolic (congestive) heart failure: Secondary | ICD-10-CM | POA: Diagnosis not present

## 2021-10-19 DIAGNOSIS — Z794 Long term (current) use of insulin: Secondary | ICD-10-CM | POA: Diagnosis not present

## 2021-10-19 DIAGNOSIS — I10 Essential (primary) hypertension: Secondary | ICD-10-CM | POA: Diagnosis not present

## 2021-10-19 DIAGNOSIS — I5032 Chronic diastolic (congestive) heart failure: Secondary | ICD-10-CM | POA: Diagnosis not present

## 2021-10-19 DIAGNOSIS — I480 Paroxysmal atrial fibrillation: Secondary | ICD-10-CM | POA: Diagnosis not present

## 2021-10-19 DIAGNOSIS — I4891 Unspecified atrial fibrillation: Secondary | ICD-10-CM | POA: Diagnosis not present

## 2021-10-19 DIAGNOSIS — E1142 Type 2 diabetes mellitus with diabetic polyneuropathy: Secondary | ICD-10-CM

## 2021-10-19 DIAGNOSIS — I5033 Acute on chronic diastolic (congestive) heart failure: Secondary | ICD-10-CM | POA: Diagnosis not present

## 2021-10-19 DIAGNOSIS — J984 Other disorders of lung: Secondary | ICD-10-CM | POA: Diagnosis not present

## 2021-10-19 DIAGNOSIS — G4733 Obstructive sleep apnea (adult) (pediatric): Secondary | ICD-10-CM | POA: Diagnosis not present

## 2021-10-19 DIAGNOSIS — E039 Hypothyroidism, unspecified: Secondary | ICD-10-CM | POA: Diagnosis not present

## 2021-10-20 DIAGNOSIS — D649 Anemia, unspecified: Secondary | ICD-10-CM | POA: Diagnosis not present

## 2021-10-20 DIAGNOSIS — I10 Essential (primary) hypertension: Secondary | ICD-10-CM | POA: Diagnosis not present

## 2021-10-20 DIAGNOSIS — E039 Hypothyroidism, unspecified: Secondary | ICD-10-CM | POA: Diagnosis not present

## 2021-10-20 DIAGNOSIS — J984 Other disorders of lung: Secondary | ICD-10-CM | POA: Diagnosis not present

## 2021-10-20 DIAGNOSIS — I5033 Acute on chronic diastolic (congestive) heart failure: Secondary | ICD-10-CM | POA: Diagnosis not present

## 2021-10-20 DIAGNOSIS — I4891 Unspecified atrial fibrillation: Secondary | ICD-10-CM | POA: Diagnosis not present

## 2021-10-20 DIAGNOSIS — G4733 Obstructive sleep apnea (adult) (pediatric): Secondary | ICD-10-CM | POA: Diagnosis not present

## 2021-10-21 ENCOUNTER — Telehealth: Payer: Self-pay | Admitting: Adult Health

## 2021-10-21 DIAGNOSIS — J810 Acute pulmonary edema: Secondary | ICD-10-CM

## 2021-10-21 MED ORDER — BENZONATATE 200 MG PO CAPS: 200.0000 mg | ORAL_CAPSULE | Freq: Three times a day (TID) | ORAL | 1 refills | Status: DC | PRN

## 2021-10-21 NOTE — Telephone Encounter (Signed)
Patient COPD patient of Dr. Annamaria Boots  on [facility]  Recent admission to [facility] for CHF, A Fib s/p cardioversion , d/c yesterday . Feels better with less leg swelling and dyspnea . Cough started yesterday . Very bad last night kept her up most of the =night  No fever , discolored mucs or fever. No orthopnea or edema  No hemoptysis . On Eliquis  Started on ACE inhibitor in hospital .  Recent abx and prednisone .  IDDM .  Will call in Tessalon . Can use Delsym 2 tsp Twice daily  ,prn and tessalon Three times a day   Call cards regarding ACE inhibitor may be aggravating cough .  If not improving will need ov or ER  Please contact office for sooner follow up if symptoms do not improve or worsen or seek emergency care   Triage , need ov in 1-2 weeks with chest xray as xray during hospital stay with bilateral infiltrates  /effusions ? Edema .

## 2021-10-22 ENCOUNTER — Ambulatory Visit (INDEPENDENT_AMBULATORY_CARE_PROVIDER_SITE_OTHER): Payer: Medicare Other | Admitting: *Deleted

## 2021-10-22 ENCOUNTER — Telehealth: Payer: Self-pay | Admitting: Emergency Medicine

## 2021-10-22 DIAGNOSIS — E1142 Type 2 diabetes mellitus with diabetic polyneuropathy: Secondary | ICD-10-CM

## 2021-10-22 DIAGNOSIS — I5032 Chronic diastolic (congestive) heart failure: Secondary | ICD-10-CM

## 2021-10-22 DIAGNOSIS — I480 Paroxysmal atrial fibrillation: Secondary | ICD-10-CM

## 2021-10-22 NOTE — Chronic Care Management (AMB) (Signed)
Chronic Care Management   CCM RN Visit Note  10/22/2021 Name: [name] MRN: [id number] DOB: 02/06/1941  Subjective: [name] is a 81 y.o. year old female who is a primary care patient of Sagardia, Ines Bloomer, MD. The care management team was consulted for assistance with disease management and care coordination needs.    Engaged with patient by telephone for follow up visit in response to provider referral for case management and/or care coordination services.   Consent to Services:  The patient was given information about Chronic Care Management services, agreed to services, and gave verbal consent prior to initiation of services.  Please see initial visit note for detailed documentation.  Patient agreed to services and verbal consent obtained.   Assessment: Review of patient past medical history, allergies, medications, health status, including review of consultants reports, laboratory and other test data, was performed as part of comprehensive evaluation and provision of chronic care management services.   CCM Care Plan Allergies  Allergen Reactions   Doxycycline     Made patient feel very ill    Metformin Diarrhea   Outpatient Encounter Medications as of 10/22/2021  Medication Sig Note   Accu-Chek Softclix Lancets lancets 1 each by Other route 3 (three) times daily. as directed    allopurinol (ZYLOPRIM) 300 MG tablet TAKE 1 TABLET(300 MG) BY MOUTH DAILY    amiodarone (PACERONE) 200 MG tablet Take 1 tablet (200 mg total) by mouth daily.    amoxicillin-clavulanate (AUGMENTIN) 875-125 MG tablet Take 1 tablet by mouth 2 (two) times daily. (Patient not taking: Reported on 10/10/2021)    b complex vitamins capsule Take 1 capsule by mouth daily.    benzonatate (TESSALON) 200 MG capsule Take 1 capsule (200 mg total) by mouth 3 (three) times daily as needed for cough.    blood glucose meter kit and supplies Dispense based on patient and insurance preference. Use up to four  times daily as directed. (FOR ICD-10 E10.9, E11.9).    Budeson-Glycopyrrol-Formoterol (BREZTRI AEROSPHERE) 160-9-4.8 MCG/ACT AERO Inhale 2 puffs into the lungs in the morning and at bedtime. (Patient not taking: Reported on 10/10/2021)    Budeson-Glycopyrrol-Formoterol (BREZTRI AEROSPHERE) 160-9-4.8 MCG/ACT AERO Inhale 2 puffs into the lungs in the morning and at bedtime. (Patient not taking: Reported on 10/10/2021)    cholecalciferol (VITAMIN D3) 25 MCG (1000 UNIT) tablet Take 1,000 Units by mouth daily.    cloNIDine (CATAPRES) 0.3 MG tablet Take 1 tablet (0.3 mg total) by mouth 2 (two) times daily.    Continuous Blood Gluc Receiver (FREESTYLE LIBRE 14 DAY READER) DEVI Use as directed.    Continuous Blood Gluc Sensor (FREESTYLE LIBRE 14 DAY SENSOR) MISC Use as directed to check blood sugar daily    cyclobenzaprine (FLEXERIL) 5 MG tablet Take 1 tablet (5 mg total) by mouth as needed for muscle spasms. (Patient not taking: Reported on 10/10/2021)    diltiazem (CARDIZEM) 30 MG tablet Take 1 tablet (30 mg total) by mouth See admin instructions. Take one tablet every 4 hours as needed for heart rate greater than 100 10/10/2021: Is not working for pt    ELIQUIS 5 MG TABS tablet TAKE 1 TABLET(5 MG) BY MOUTH TWICE DAILY    HYDROcodone bit-homatropine (HYCODAN) 5-1.5 MG/5ML syrup Take 5 mLs by mouth at bedtime as needed for cough. (Patient not taking: Reported on 10/10/2021)    Insulin Lispro Prot & Lispro (HUMALOG MIX 75/25 KWIKPEN) (75-25) 100 UNIT/ML Kwikpen Inject 25 Units into the skin 2 (two) times daily.  levothyroxine (SYNTHROID) 75 MCG tablet Take 75 mcg by mouth daily before breakfast.    lidocaine (LIDODERM) 5 % Place 1 patch onto the skin daily. Remove & Discard patch within 12 hours or as directed by MD (Patient taking differently: Place 1 patch onto the skin daily as needed (Pain). Remove & Discard patch within 12 hours or as directed by MD)    linaclotide (LINZESS) 145 MCG CAPS capsule Take 1  capsule (145 mcg total) by mouth daily before breakfast. (Patient taking differently: Take 145 mcg by mouth every other day.)    loperamide (IMODIUM) 2 MG capsule Take 1 capsule (2 mg total) by mouth 4 (four) times daily as needed for diarrhea or loose stools.    losartan (COZAAR) 100 MG tablet TAKE 1 TABLET BY MOUTH  DAILY    NOVOFINE PLUS PEN NEEDLE 32G X 4 MM MISC USE TWICE DAILY AS DIRECTED (Patient taking differently: 1 each by Other route as directed.)    ONETOUCH VERIO test strip 1 each by Other route 3 (three) times daily.    potassium chloride (KLOR-CON) 10 MEQ tablet Take 2 tablets (20 mEq total) by mouth daily.    RESTASIS MULTIDOSE 0.05 % ophthalmic emulsion Place 1 drop into both eyes 2 (two) times daily.     rosuvastatin (CRESTOR) 10 MG tablet TAKE 1 TABLET BY MOUTH IN  THE EVENING    Semaglutide, 1 MG/DOSE, (OZEMPIC, 1 MG/DOSE,) 4 MG/3ML SOPN Inject 1 mg into the skin once a week. (Patient taking differently: Inject 1 mg into the skin every Saturday.)    torsemide (DEMADEX) 20 MG tablet Take 1 tablet (20 mg total) by mouth daily.    triamcinolone cream (KENALOG) 0.1 % Apply 1 application topically 2 (two) times daily. (Patient taking differently: Apply 1 application topically 2 (two) times daily as needed (dry skin).)    verapamil (CALAN) 120 MG tablet Take 1 tablet (120 mg total) by mouth in the morning.    vitamin B-12 (CYANOCOBALAMIN) 1000 MCG tablet Take 1,000 mcg by mouth daily.    No facility-administered encounter medications on file as of 10/22/2021.   Patient Active Problem List   Diagnosis Date Noted   Normocytic anemia 10/11/2021   Lung nodule 09/30/2021   Cough 09/27/2021   Moderate asthma with acute exacerbation 09/27/2021   Pulmonary edema 09/27/2021   Bruising at injection site 08/03/2021   Abdominal pain 08/03/2021   CAP (community acquired pneumonia) 07/17/2021   Chills 07/17/2021   Visual hallucination 07/17/2021   Dysthymia 03/12/2021   Dysuria 03/01/2021    Primary osteoarthritis involving multiple joints 03/01/2021   Recurrent falls 01/10/2021   Sensorineural hearing loss (SNHL), 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   Persistent atrial fibrillation ([place]) 08/12/2020   Type 2 diabetes mellitus with hyperglycemia, with long-term current use of insulin ([place]) 06/21/2020   Type 2 diabetes mellitus with diabetic polyneuropathy, with long-term current use of insulin ([place]) 06/21/2020   Mixed hyperlipidemia 06/21/2020   Uncontrolled hypertension 06/11/2020   Aortic atherosclerosis ([place]) 03/28/2020   Diabetic peripheral neuropathy ([place]) 12/26/2019   Gastroesophageal reflux disease without esophagitis 03/17/2019   Bilateral leg edema 02/16/2019   Pruritus 02/16/2019   Varicose veins of both legs with edema 02/16/2019   Heart murmur 09/05/2018   Restrictive lung disease 02/22/2018   Dysgeusia 06/11/2017   Osteoarthritis of left knee 06/03/2017   NAFLD (nonalcoholic fatty liver disease) 08/07/2016   Osteoporosis 12/31/2015   Obesity 12/19/2015   Hypersomnia 12/19/2015   Edema 10/18/2015   Acquired hypothyroidism 10/18/2015  Dyspnea 16/60/6301   Acute diastolic congestive heart failure, NYHA class 3 ([place]) 10/03/2013   Prolonged QT interval 60/07/9322   Acute diastolic heart failure ([place]) 10/03/2013   Long term (current) use of anticoagulants 06/09/2012   BENIGN NEOPLASM OF ADRENAL GLAND 11/25/2010   Chronic diastolic heart failure ([place]) 02/20/2010   DM 05/16/2009   GOUT 05/16/2009   OBESITY, MORBID 05/16/2009   Hypertension associated with diabetes ([place]) 05/16/2009   Paroxysmal atrial fibrillation ([place]) 05/16/2009   HYPERLIPIDEMIA 11/30/2008   Obstructive sleep apnea on CPAP 11/30/2008   Conditions to be addressed/monitored:  Atrial Fibrillation, CHF, and DMII  Care Plan : RN Care Manager Plan of Care  Updates made by Knox Royalty, RN since 10/22/2021 12:00 AM     Problem: Chronic Disease Management Needs   Priority: High     Long-Range Goal:  Ongoing adherence to established care plan for long term chronic disease management   Start Date: 10/09/2021  Expected End Date: 10/09/2022  Priority: High  Note:   Current Barriers:  Chronic Disease Management support and education needs related to Atrial Fibrillation, CHF, and DMII Fragile state of health, multiple progressing chronic health conditions-- requires ongoing reinforcement/ support for self-health management of multiple co-morbidities Recent hospitalization October 11, 2021- October 20, 2021: A-Fib with RVR; reports was at [facility]- patient reports received cardioversion during hospitalization  RNCM Clinical Goal(s):  Patient will demonstrate ongoing health management independence as evidenced by adherence to established plan of care for CHF/ AF, and DMII        through collaboration with RN Care manager, provider, and care team.   Interventions: 1:1 collaboration with primary care provider regarding development and update of comprehensive plan of care as evidenced by provider attestation and co-signature Inter-disciplinary care team collaboration (see longitudinal plan of care) Evaluation of current treatment plan related to  self management and patient's adherence to plan as established by provider Reviewed recent hospitalization with patient: reports "feeling much better" now; states she believes she is not in A-Fib post-hospital discharge, denies clinical concerns today Discussed her report that she has had "cough at night" since being placed on lisinopril post- recent hospitalization: reports she has stopped taking  Heart Failure Interventions:  (Status: Goal on Track (progressing): YES.)  Long Term Goal  Basic overview and discussion of pathophysiology of Heart Failure reviewed Discussed importance of daily weight and advised patient to weigh and record daily Discussed the importance of keeping all appointments with provider Reviewed recent weights at home: reports  general weights between 241-245; reports weight this morning of "241 lbs:" this represents previous reports- at baseline; she denies signs/ symptoms CHF yellow zone; reports "breathing is okay;" stated she received newly ordered new CPAP unit for home use: has not used much post- recent hospital discharge due to ongoing cough at night- plans to resume using tonight Confirmed patient continues trying to follow low sodium heart healthy diet Reinforced previously provided education around signs/ symptoms yellow CHF zone, along with corresponding action plan: she verbalizes good understanding of same and will benefit from ongoing reinforcement and support of CHF self-health management Reviewed upcoming scheduled provider office visits: 10/24/21- cardiology-- patient unaware of this visit- has added to her calendar, verbalizes plans to attend- encouraged her to discuss side effects of lisinopril with cardiologist- reports she will do; 10/30/21- PCP; 11/18/21- CCM pharmacy Reinforced previously provided education around signs/ symptoms A-Fib, along with corresponding action plan for changes in rate/ symptoms  Diabetes:  (Status: Goal on Track (  progressing): YES.) Long Term Goal   Lab Results  Component Value Date   HGBA1C 8.6 (H) 08/29/2021  Counseled on importance of regular laboratory monitoring as prescribed;        Discussed plans with patient for ongoing care management follow up and provided patient with direct contact information for care management team;      Review of patient status, including review of consultants reports, relevant laboratory and other test results, and medications completed;       Confirmed continues "trying" to follow carb-modified, low sugar diet Reviewed recent blood sugars at home-- they have been "fine" per patient report since her recent hospital discharge: reports consistent values between 130-160, both with fasting and post-prandial values: reports fasting this morning of  "130" Denies questions, concerns around medications: encouraged her ongoing engagement with [place] team  Patient Goals/Self-Care Activities: As evidenced by review of EHR, collaboration with care team, and patient reporting during CCM RN CM outreach,   Patient Basia will: Take medications as prescribed Attend all scheduled provider appointments Call pharmacy for medication refills Call provider office for new concerns or questions Continue to check fasting (first thing in the morning, before eating) and 2-hours AFTER eating blood sugars at home-- this will help your doctors know if your medications are working or if they need adjusting: we will review these values each time we talk over the phone  Continue to monitor and write down on paper your morning weights every day: if you gain more than 3 lbs overnight, or 5 lbs in one week: please call your cardiology provider for instructions as to what you should do: you reported a weight this morning of 241 lbs: this is right in line with the previous weights you have been reporting Watch for swelling in feet, ankles and legs every day Wear the compression stockings that the cardiology staff recommended on days that you notice swelling in your feet/ ankles Continue to follow heart healthy, low salt, low cholesterol, carbohydrate-modified, low sugar diet Try to stay as active as possible              Plan: Telephone follow up appointment with care management team member scheduled for:  Wednesday November 27, 2021 at 11:30 am The patient has been provided with contact information for the care management team and has been advised to call with any health related questions or concerns  [name], RN, BSN, [place] [phone]: direct office

## 2021-10-22 NOTE — Telephone Encounter (Signed)
Patient informing provider she was prescribed lisinopril when discharged from hospital  Patient states she has a severe cough due to medication  Patient next ov is on 10-30-2021

## 2021-10-22 NOTE — Telephone Encounter (Signed)
Spoke with the pt She has improved since TP spoke with her  I have scheduled her for f/u here with TP on 10/31/21

## 2021-10-22 NOTE — Patient Instructions (Signed)
Visit Information  [name], thank you for taking time to talk with me today. Please don't hesitate to contact me if I can be of assistance to you before our next scheduled telephone appointment.  Below are the goals we discussed today:  Patient Self-Care Activities: [name] will: Take medications as prescribed Attend all scheduled provider appointments Call pharmacy for medication refills Call provider office for new concerns or questions Continue to check fasting (first thing in the morning, before eating) and 2-hours AFTER eating blood sugars at home-- this will help your doctors know if your medications are working or if they need adjusting: we will review these values each time we talk over the phone  Continue to monitor and write down on paper your morning weights every day: if you gain more than 3 lbs overnight, or 5 lbs in one week: please call your cardiology provider for instructions as to what you should do: you reported a weight this morning of 241 lbs: this is right in line with the previous weights you have been reporting Watch for swelling in feet, ankles and legs every day Wear the compression stockings that the cardiology staff recommended on days that you notice swelling in your feet/ ankles Continue to follow heart healthy, low salt, low cholesterol, carbohydrate-modified, low sugar diet Try to stay as active as possible  Our next scheduled telephone follow up visit/ appointment with care management team member is scheduled on:  Wednesday, November 27, 2021 at 11:30 am  If you need to cancel or re-schedule our visit, please call [phone] and our care guide team will be happy to assist you.   I look forward to hearing about your progress.   [name] Rack, RN, BSN, [place] [phone]: direct office  If you are experiencing a Mental Health or [place] or need someone to talk to,  please  call the Suicide and Crisis Lifeline: 988 call the [place] National Suicide Prevention Lifeline: 323-329-1906 or TTY: 562-873-9595 TTY (605) 778-3996) to talk to a trained counselor call 1-800-273-TALK (toll free, 24 hour hotline) go to [facility] [address] [place] [phone]) call 911   The patient verbalized understanding of instructions, educational materials, and care plan provided today and agreed to receive a mailed copy of patient instructions, educational materials, and care plan  Atrial Fibrillation Atrial fibrillation is a type of heartbeat that is irregular or fast. If you have this condition, your heart beats without any order. This makes it hard for your heart to pump blood in a normal way. Atrial fibrillation may come and go, or it may become a long-lasting problem. If this condition is not treated, it can put you at higher risk for stroke, heart failure, and other heart problems. What are the causes? This condition may be caused by diseases that damage the heart. They include: High blood pressure. Heart failure. Heart valve disease. Heart surgery. Other causes include: Diabetes. Thyroid disease. Being overweight. Kidney disease. Sometimes the cause is not known. What increases the risk? You are more likely to develop this condition if: You are older. You smoke. You exercise often and very hard. You have a family history of this condition. You are a man. You use drugs. You drink a lot of alcohol. You have lung conditions, such as emphysema, pneumonia, or COPD. You have sleep apnea. What are the signs or symptoms? Common symptoms of this condition include: A feeling that  your heart is beating very fast. Chest pain or discomfort. Feeling short of breath. Suddenly feeling light-headed or weak. Getting tired easily during activity. Fainting. Sweating. In some cases, there are no symptoms. How is this  treated? Treatment for this condition depends on underlying conditions and how you feel when you have atrial fibrillation. They include: Medicines to: Prevent blood clots. Treat heart rate or heart rhythm problems. Using devices, such as a pacemaker, to correct heart rhythm problems. Doing surgery to remove the part of the heart that sends bad signals. Closing an area where clots can form in the heart (left atrial appendage). In some cases, your doctor will treat other underlying conditions. Follow these instructions at home: Medicines Take over-the-counter and prescription medicines only as told by your doctor. Do not take any new medicines without first talking to your doctor. If you are taking blood thinners: Talk with your doctor before you take any medicines that have aspirin or NSAIDs, such as ibuprofen, in them. Take your medicine exactly as told by your doctor. Take it at the same time each day. Avoid activities that could hurt or bruise you. Follow instructions about how to prevent falls. Wear a bracelet that says you are taking blood thinners. Or, carry a card that lists what medicines you take. Lifestyle   Do not use any products that have nicotine or tobacco in them. These include cigarettes, e-cigarettes, and chewing tobacco. If you need help quitting, ask your doctor. Eat heart-healthy foods. Talk with your doctor about the right eating plan for you. Exercise regularly as told by your doctor. Do not drink alcohol. Lose weight if you are overweight. Do not use drugs, including cannabis. General instructions If you have a condition that causes breathing to stop for a short period of time (apnea), treat it as told by your doctor. Keep a healthy weight. Do not use diet pills unless your doctor says they are safe for you. Diet pills may make heart problems worse. Keep all follow-up visits as told by your doctor. This is important. Contact a doctor if: You notice a change in  the speed, rhythm, or strength of your heartbeat. You are taking a blood-thinning medicine and you get more bruising. You get tired more easily when you move or exercise. You have a sudden change in weight. Get help right away if:  You have pain in your chest or your belly (abdomen). You have trouble breathing. You have side effects of blood thinners, such as blood in your vomit, poop (stool), or pee (urine), or bleeding that cannot stop. You have any signs of a stroke. "BE FAST" is an easy way to remember the main warning signs: B - Balance. Signs are dizziness, sudden trouble walking, or loss of balance. E - Eyes. Signs are trouble seeing or a change in how you see. F - Face. Signs are sudden weakness or loss of feeling in the face, or the face or eyelid drooping on one side. A - Arms. Signs are weakness or loss of feeling in an arm. This happens suddenly and usually on one side of the body. S - Speech. Signs are sudden trouble speaking, slurred speech, or trouble understanding what people say. T - Time. Time to call emergency services. Write down what time symptoms started. You have other signs of a stroke, such as: A sudden, very bad headache with no known cause. Feeling like you may vomit (nausea). Vomiting. A seizure. These symptoms may be an emergency. Do not wait  to see if the symptoms will go away. Get medical help right away. Call your local emergency services (911 in the [place] Do not drive yourself to the hospital. Summary Atrial fibrillation is a type of heartbeat that is irregular or fast. You are at higher risk of this condition if you smoke, are older, have diabetes, or are overweight. Follow your doctor's instructions about medicines, diet, exercise, and follow-up visits. Get help right away if you have signs or symptoms of a stroke. Get help right away if you cannot catch your breath, or you have chest pain or discomfort. This information is not intended to replace  advice given to you by your health care provider. Make sure you discuss any questions you have with your health care provider. Document Revised: 03/30/2019 Document Reviewed: 03/30/2019 Elsevier Patient Education  [place].

## 2021-10-23 ENCOUNTER — Other Ambulatory Visit: Payer: Self-pay | Admitting: Emergency Medicine

## 2021-10-23 MED ORDER — BENZONATATE 100 MG PO CAPS: 100.0000 mg | ORAL_CAPSULE | Freq: Two times a day (BID) | ORAL | 0 refills | Status: DC | PRN

## 2021-10-23 NOTE — Telephone Encounter (Signed)
Called and spoke with pt, she states that she was given Lisinopril in the ER. She states that she stop taking the Lisinopril and would like something for cough. She has an OV 10/29/21.

## 2021-10-23 NOTE — Telephone Encounter (Signed)
Called and spoke with pt

## 2021-10-23 NOTE — Telephone Encounter (Signed)
Prescription for [name] sent to pharmacy of record.  Thanks.

## 2021-10-24 ENCOUNTER — Ambulatory Visit (INDEPENDENT_AMBULATORY_CARE_PROVIDER_SITE_OTHER): Payer: Medicare Other | Admitting: Student

## 2021-10-24 ENCOUNTER — Encounter: Payer: Self-pay | Admitting: Student

## 2021-10-24 ENCOUNTER — Other Ambulatory Visit: Payer: Self-pay

## 2021-10-24 ENCOUNTER — Telehealth: Payer: Self-pay

## 2021-10-24 ENCOUNTER — Other Ambulatory Visit: Payer: Self-pay | Admitting: Emergency Medicine

## 2021-10-24 VITALS — BP 140/70 | HR 56 | Ht 61.0 in | Wt 241.0 lb

## 2021-10-24 DIAGNOSIS — I10 Essential (primary) hypertension: Secondary | ICD-10-CM

## 2021-10-24 DIAGNOSIS — D6869 Other thrombophilia: Secondary | ICD-10-CM

## 2021-10-24 DIAGNOSIS — I4819 Other persistent atrial fibrillation: Secondary | ICD-10-CM | POA: Diagnosis not present

## 2021-10-24 DIAGNOSIS — I5032 Chronic diastolic (congestive) heart failure: Secondary | ICD-10-CM

## 2021-10-24 NOTE — Telephone Encounter (Signed)
Patient advised and verbalized understanding 

## 2021-10-24 NOTE — Progress Notes (Signed)
PCP:  Horald Pollen, MD Primary Cardiologist: None Electrophysiologist: [name], MD   [name] is a 81 y.o. female seen today for [name], MD for routine electrophysiology followup.    She was admitted to [facility] for HF, pneumonia, and AF last week and underwent [facility]. She was also switched from amiodarone to multaq.  EF 55-60%.   Since discharge from hospital the patient reports doing much better. She is not very active at baseline, but has had less SOB since hospitalization. No edema.  she denies chest pain, palpitations, dyspnea, PND, orthopnea, nausea, vomiting, dizziness, syncope, weight gain, or early satiety.  Past Medical History:  Diagnosis Date   Arthritis    Back pain    Chronic anticoagulation    due to aflutter   Chronic kidney disease    Diabetes mellitus    Diastolic CHF, chronic ([place])    a.  echo 2006 - ef 55-65%; mild diast dysfxn;    b. Echo 08/2011: Mild LVH, EF 60%;  c. 04/2013 Echo: EF 65-69%, mild conc LVH;  08/2014 Echo: EF 60-65%, mild-mod MR.   Gout    Hyperlipidemia    Hypertension    a.  Renal arterial Dopplers 12/2011: 1-59% right renal artery stenosis   Morbid obesity ([place])    Obstructive sleep apnea on CPAP    Paroxysmal Afib/Flutter    a. 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   Past Surgical History:  Procedure Laterality Date   APPENDECTOMY     ATRIAL FLUTTER ABLATION N/A 09/24/2011   Procedure: ATRIAL FLUTTER ABLATION;  Surgeon: [name], MD;  Location: [facility] CATH LAB;  Service: Cardiovascular;  Laterality: N/A;   CARDIOVERSION  10/22/2011   Procedure: CARDIOVERSION;  Surgeon: [name], MD;  Location: [place];  Service: Cardiovascular;  Laterality: N/A;   CARDIOVERSION N/A 09/10/2011   Procedure: CARDIOVERSION;  Surgeon: [name], MD;  Location: [facility] CATH LAB;  Service: Cardiovascular;  Laterality: N/A;   CHOLECYSTECTOMY     COLONOSCOPY WITH PROPOFOL N/A 06/13/2021   Procedure: COLONOSCOPY WITH PROPOFOL;   Surgeon: Milus Banister, MD;  Location: [facility] ENDOSCOPY;  Service: Endoscopy;  Laterality: N/A;   POLYPECTOMY  06/13/2021   Procedure: POLYPECTOMY;  Surgeon: Milus Banister, MD;  Location: [facility] ENDOSCOPY;  Service: Endoscopy;;   TONSILLECTOMY  1982   TOTAL ABDOMINAL HYSTERECTOMY      Current Outpatient Medications  Medication Sig Dispense Refill   Accu-Chek Softclix Lancets lancets 1 each by Other route 3 (three) times daily. as directed 100 each 3   allopurinol (ZYLOPRIM) 300 MG tablet TAKE 1 TABLET(300 MG) BY MOUTH DAILY 90 tablet 3   amoxicillin-clavulanate (AUGMENTIN) 875-125 MG tablet Take 1 tablet by mouth 2 (two) times daily. 14 tablet 0   b complex vitamins capsule Take 1 capsule by mouth daily.     benzonatate (TESSALON) 100 MG capsule Take 1 capsule (100 mg total) by mouth 2 (two) times daily as needed for cough. 20 capsule 0   blood glucose meter kit and supplies Dispense based on patient and insurance preference. Use up to four times daily as directed. (FOR ICD-10 E10.9, E11.9). 1 each 0   Budeson-Glycopyrrol-Formoterol (BREZTRI AEROSPHERE) 160-9-4.8 MCG/ACT AERO Inhale 2 puffs into the lungs in the morning and at bedtime. 5.9 g 3   Budeson-Glycopyrrol-Formoterol (BREZTRI AEROSPHERE) 160-9-4.8 MCG/ACT AERO Inhale 2 puffs into the lungs in the morning and at bedtime. 5.9 g 0   cholecalciferol (VITAMIN D3) 25 MCG (1000 UNIT) tablet Take  1,000 Units by mouth daily.     cloNIDine (CATAPRES) 0.3 MG tablet Take 1 tablet (0.3 mg total) by mouth 2 (two) times daily. 180 tablet 3   Continuous Blood Gluc Receiver (FREESTYLE LIBRE 14 DAY READER) DEVI Use as directed. 1 each 0   Continuous Blood Gluc Sensor (FREESTYLE LIBRE 14 DAY SENSOR) MISC Use as directed to check blood sugar daily 2 each 3   cyclobenzaprine (FLEXERIL) 5 MG tablet Take 1 tablet (5 mg total) by mouth as needed for muscle spasms. 30 tablet 0   diltiazem (CARDIZEM) 30 MG tablet Take 1 tablet (30 mg total) by mouth See admin  instructions. Take one tablet every 4 hours as needed for heart rate greater than 100 45 tablet 2   dronedarone (MULTAQ) 400 MG tablet Take 400 mg by mouth 2 (two) times daily.     ELIQUIS 5 MG TABS tablet TAKE 1 TABLET(5 MG) BY MOUTH TWICE DAILY 180 tablet 1   HYDROcodone bit-homatropine (HYCODAN) 5-1.5 MG/5ML syrup Take 5 mLs by mouth at bedtime as needed for cough. 120 mL 0   Insulin Lispro Prot & Lispro (HUMALOG MIX 75/25 KWIKPEN) (75-25) 100 UNIT/ML Kwikpen Inject 25 Units into the skin 2 (two) times daily. 45 mL 4   levothyroxine (SYNTHROID) 75 MCG tablet TAKE 1 TABLET BY MOUTH  DAILY BEFORE BREAKFAST 90 tablet 3   lidocaine (LIDODERM) 5 % Place 1 patch onto the skin daily. Remove & Discard patch within 12 hours or as directed by MD (Patient taking differently: Place 1 patch onto the skin daily as needed (Pain). Remove & Discard patch within 12 hours or as directed by MD) 30 patch 0   linaclotide (LINZESS) 145 MCG CAPS capsule Take 1 capsule (145 mcg total) by mouth daily before breakfast. 30 capsule 1   loperamide (IMODIUM) 2 MG capsule Take 1 capsule (2 mg total) by mouth 4 (four) times daily as needed for diarrhea or loose stools. 12 capsule 0   losartan (COZAAR) 100 MG tablet TAKE 1 TABLET BY MOUTH  DAILY 90 tablet 3   metoprolol tartrate (LOPRESSOR) 25 MG tablet Take 25 mg by mouth 2 (two) times daily.     NOVOFINE PLUS PEN NEEDLE 32G X 4 MM MISC USE TWICE DAILY AS DIRECTED (Patient taking differently: 1 each by Other route as directed.) 100 each 11   ONETOUCH VERIO test strip 1 each by Other route 3 (three) times daily.     potassium chloride (KLOR-CON) 10 MEQ tablet Take 2 tablets (20 mEq total) by mouth daily. 180 tablet 3   RESTASIS MULTIDOSE 0.05 % ophthalmic emulsion Place 1 drop into both eyes 2 (two) times daily.      rosuvastatin (CRESTOR) 10 MG tablet TAKE 1 TABLET BY MOUTH IN  THE EVENING 90 tablet 3   Semaglutide, 1 MG/DOSE, (OZEMPIC, 1 MG/DOSE,) 4 MG/3ML SOPN Inject 1 mg into  the skin once a week. (Patient taking differently: Inject 1 mg into the skin every Saturday.) 9 mL 3   torsemide (DEMADEX) 20 MG tablet Take 1 tablet (20 mg total) by mouth daily.     triamcinolone cream (KENALOG) 0.1 % Apply 1 application topically 2 (two) times daily. (Patient taking differently: Apply 1 application topically 2 (two) times daily as needed (dry skin).) 30 g 0   verapamil (CALAN) 120 MG tablet Take 1 tablet (120 mg total) by mouth in the morning. 180 tablet 3   vitamin B-12 (CYANOCOBALAMIN) 1000 MCG tablet Take 1,000 mcg by mouth  daily.     No current facility-administered medications for this visit.    Allergies  Allergen Reactions   Doxycycline     Made patient feel very ill    Metformin Diarrhea    Social History   Socioeconomic History   Marital status: Married    Spouse name: Not on file   Number of children: 3   Years of education: Not on file   Highest education level: Not on file  Occupational History   Occupation: DISABILITY/housewife    Employer: RETIRED  Tobacco Use   Smoking status: Never   Smokeless tobacco: Never  Vaping Use   Vaping Use: Never used  Substance and Sexual Activity   Alcohol use: No   Drug use: No   Sexual activity: Yes  Other Topics Concern   Not on file  Social History Narrative   Not on file   Social Determinants of Health   Financial Resource Strain: Not on file  Food Insecurity: No Food Insecurity   Worried About Running Out of Food in the Last Year: Never true   [place] in the Last Year: Never true  Transportation Needs: No Transportation Needs   Lack of Transportation (Medical): No   Lack of Transportation (Non-Medical): No  Physical Activity: Not on file  Stress: Not on file  Social Connections: Not on file  Intimate Partner Violence: Not on file     Review of Systems: All other systems reviewed and are otherwise negative except as noted above.  Physical Exam: Vitals:   10/24/21 0936  BP:  140/70  Pulse: (!) 56  SpO2: 94%  Weight: 241 lb (109.3 kg)  Height: _0  (1.549 m)    GEN- The patient is well appearing, alert and oriented x 3 today.   HEENT: normocephalic, atraumatic; sclera clear, conjunctiva pink; hearing intact; oropharynx clear; neck supple, no JVP Lymph- no cervical lymphadenopathy Lungs- Clear to ausculation bilaterally, normal work of breathing.  No wheezes, rales, rhonchi Heart- Regular rate and rhythm, no murmurs, rubs or gallops, PMI not laterally displaced GI- soft, non-tender, non-distended, bowel sounds present, no hepatosplenomegaly Extremities- no clubbing, cyanosis, or edema; DP/PT/radial pulses 2+ bilaterally MS- no significant deformity or atrophy Skin- warm and dry, no rash or lesion Psych- euthymic mood, full affect Neuro- strength and sensation are intact  EKG is ordered. Personal review of EKG from today shows NSR/sinus brady at 56 bpm  Additional studies reviewed include: Previous EP office notes.   Assessment and Plan:  1.  Persistent atrial fibrillation EKG today shows NSR s/p DCC at [facility] last week.  CMET/TSH stable 10/11/2021 Amiodarone stopped by Cardiology at [facility]. Now on Multaq 400 mg BID. We discussed that being on multaq and having AF for long periods can be potentially dangerous, and she should call for follow up right away if she notices she goes out of rhythm.  Continue metoprolol  Continue verapamil Continue prn diltiazem Continue Eliquis for CHADS2VASC of at least 3   2.  HTN Stable on current regimen    3.  Obesity Body mass index is 45.54 kg/m.  Weight loss encouraged   Follow up with Dr. Caryl Comes in  2-3 months    Shirley Friar, PA-C  10/24/21 10:35 AM

## 2021-10-24 NOTE — Patient Instructions (Signed)
Medication Instructions:  Your physician recommends that you continue on your current medications as directed. Please refer to the Current Medication list given to you today.  *If you need a refill on your cardiac medications before your next appointment, please call your pharmacy*   Lab Work: None If you have labs (blood work) drawn today and your tests are completely normal, you will receive your results only by: [place] (if you have MyChart) OR A paper copy in the mail If you have any lab test that is abnormal or we need to change your treatment, we will call you to review the results.   Follow-Up: At [facility], you and your health needs are our priority.  As part of our continuing mission to provide you with exceptional heart care, we have created designated Provider Care Teams.  These Care Teams include your primary Cardiologist (physician) and Advanced Practice Providers (APPs -  Physician Assistants and Nurse Practitioners) who all work together to provide you with the care you need, when you need it.  We recommend signing up for the patient portal called "MyChart".  Sign up information is provided on this After Visit Summary.  MyChart is used to connect with patients for Virtual Visits (Telemedicine).  Patients are able to view lab/test results, encounter notes, upcoming appointments, etc.  Non-urgent messages can be sent to your provider as well.   To learn more about what you can do with MyChart, go to [email or web address].    Your next appointment:   3 month(s)  The format for your next appointment:   In Person  Provider:   [name], MD

## 2021-10-24 NOTE — Telephone Encounter (Signed)
Per Access nurse patient wants to know if she needs to continue her insulin as prescribed. Patient was just discharged from hospital for CHF.

## 2021-10-25 NOTE — Telephone Encounter (Signed)
Patient calling in  Patient says she has been taking the benzonatate (TESSALON) 100 MG capsule provider prescribed but she is still coughing a lot  Wants to know if anything additional can be given to clear this up  Please call patient [phone]

## 2021-10-28 NOTE — Telephone Encounter (Signed)
Nothing else she can take.  Continue with over-the-counter Mucinex DM extra strength.

## 2021-10-29 NOTE — Telephone Encounter (Signed)
Called and spoke with pt about doctor's recommendations.

## 2021-10-30 ENCOUNTER — Encounter: Payer: Self-pay | Admitting: Emergency Medicine

## 2021-10-30 ENCOUNTER — Other Ambulatory Visit: Payer: Self-pay

## 2021-10-30 ENCOUNTER — Ambulatory Visit (INDEPENDENT_AMBULATORY_CARE_PROVIDER_SITE_OTHER): Payer: Medicare Other | Admitting: Emergency Medicine

## 2021-10-30 ENCOUNTER — Other Ambulatory Visit: Payer: Self-pay | Admitting: Emergency Medicine

## 2021-10-30 VITALS — BP 132/62 | HR 78 | Temp 98.5°F | Ht 61.0 in | Wt 241.0 lb

## 2021-10-30 DIAGNOSIS — Z09 Encounter for follow-up examination after completed treatment for conditions other than malignant neoplasm: Secondary | ICD-10-CM

## 2021-10-30 DIAGNOSIS — I5032 Chronic diastolic (congestive) heart failure: Secondary | ICD-10-CM | POA: Diagnosis not present

## 2021-10-30 DIAGNOSIS — E1159 Type 2 diabetes mellitus with other circulatory complications: Secondary | ICD-10-CM

## 2021-10-30 DIAGNOSIS — I480 Paroxysmal atrial fibrillation: Secondary | ICD-10-CM

## 2021-10-30 DIAGNOSIS — E1165 Type 2 diabetes mellitus with hyperglycemia: Secondary | ICD-10-CM

## 2021-10-30 DIAGNOSIS — Z7901 Long term (current) use of anticoagulants: Secondary | ICD-10-CM | POA: Diagnosis not present

## 2021-10-30 DIAGNOSIS — I152 Hypertension secondary to endocrine disorders: Secondary | ICD-10-CM

## 2021-10-30 DIAGNOSIS — Z794 Long term (current) use of insulin: Secondary | ICD-10-CM

## 2021-10-30 DIAGNOSIS — K5909 Other constipation: Secondary | ICD-10-CM

## 2021-10-30 NOTE — Progress Notes (Signed)
[name] 81 y.o.   Chief Complaint  Patient presents with   Hospitalization Follow-up    Chest pain and SOB    HISTORY OF PRESENT ILLNESS: This is a 81 y.o. female here for follow-up of hospital discharge.  Doing well.  Has no complaints or medical concerns. Medication list and changes reviewed with patient. Discharge Summary  PCP: No primary care provider on file. Discharge Details   Admit date: 10/11/2021 Discharge date and time: 10/20/2021 Hospital LOS: 10 days  Active Hospital Problems  Diagnosis Date Noted POA   *Atrial fibrillation with RVR (*) 10/11/2021 Yes   Acute on chronic diastolic heart failure (*) 10/12/2021 Yes   Normocytic anemia 10/11/2021 Yes   Primary hypertension 10/11/2021 Yes   OSA (obstructive sleep apnea) 10/11/2021 Yes   Restrictive lung disease 10/11/2021 Yes   Acquired hypothyroidism 10/11/2021 Yes   PCP:  Horald Pollen, MD Primary Cardiologist: None Electrophysiologist: [name], MD    [name] is a 81 y.o. female seen today for [name], MD for routine electrophysiology followup.     She was admitted to [facility] for HF, pneumonia, and AF last week and underwent [facility]. She was also switched from amiodarone to multaq.  EF 55-60%.    Since discharge from hospital the patient reports doing much better. She is not very active at baseline, but has had less SOB since hospitalization. No edema.  she denies chest pain, palpitations, dyspnea, PND, orthopnea, nausea, vomiting, dizziness, syncope, weight gain, or early satiety.   Cardiologist office visit on 10/24/2021 assessment and plan as follows: Assessment and Plan:   1.  Persistent atrial fibrillation EKG today shows NSR s/p DCC at [facility] last week.  CMET/TSH stable 10/11/2021 Amiodarone stopped by Cardiology at [facility]. Now on Multaq 400 mg BID. We discussed that being on multaq and having AF for long periods can be potentially dangerous, and she should call for follow up  right away if she notices she goes out of rhythm.  Continue metoprolol  Continue verapamil Continue prn diltiazem Continue Eliquis for CHADS2VASC of at least 3   2.  HTN Stable on current regimen    3.  Obesity Body mass index is 45.54 kg/m.  Weight loss encouraged    Follow up with Dr. Caryl Comes in  2-3 months     Shirley Friar, PA-C  10/24/21 10:35 AM   HPI   Prior to Admission medications   Medication Sig Start Date End Date Taking? Authorizing Provider  Accu-Chek Softclix Lancets lancets 1 each by Other route 3 (three) times daily. as directed 03/30/20  Yes Maximiano Coss, NP  allopurinol (ZYLOPRIM) 300 MG tablet TAKE 1 TABLET(300 MG) BY MOUTH DAILY 10/18/21  Yes Tipton Ballow, Ines Bloomer, MD  b complex vitamins capsule Take 1 capsule by mouth daily.   Yes [provider]  benzonatate (TESSALON) 100 MG capsule Take 1 capsule (100 mg total) by mouth 2 (two) times daily as needed for cough. 10/23/21  Yes Jaylei Fuerte, Ines Bloomer, MD  blood glucose meter kit and supplies Dispense based on patient and insurance preference. Use up to four times daily as directed. (FOR ICD-10 E10.9, E11.9). 08/29/21  Yes Frank Pilger, Ines Bloomer, MD  Budeson-Glycopyrrol-Formoterol (BREZTRI AEROSPHERE) 160-9-4.8 MCG/ACT AERO Inhale 2 puffs into the lungs in the morning and at bedtime. 09/27/21  Yes Cobb, Karie Schwalbe, NP  Budeson-Glycopyrrol-Formoterol (BREZTRI AEROSPHERE) 160-9-4.8 MCG/ACT AERO Inhale 2 puffs into the lungs in the morning and at bedtime. 09/27/21  Yes Clayton Bibles, NP  cholecalciferol (VITAMIN D3) 25 MCG (1000 UNIT) tablet Take 1,000 Units by mouth daily.   Yes [provider]  cloNIDine (CATAPRES) 0.3 MG tablet Take 1 tablet (0.3 mg total) by mouth 2 (two) times daily. 07/02/21  Yes Tank Difiore, Ines Bloomer, MD  Continuous Blood Gluc Receiver (FREESTYLE LIBRE 14 DAY READER) DEVI Use as directed. 08/29/21  Yes Fillmore Bynum, Ines Bloomer, MD  Continuous Blood Gluc Sensor  (FREESTYLE LIBRE 14 DAY SENSOR) MISC Use as directed to check blood sugar daily 08/29/21  Yes Kyser Wandel, Ines Bloomer, MD  cyclobenzaprine (FLEXERIL) 5 MG tablet Take 1 tablet (5 mg total) by mouth as needed for muscle spasms. 06/18/21  Yes Kearah Gayden, Ines Bloomer, MD  diltiazem (CARDIZEM) 30 MG tablet Take 1 tablet (30 mg total) by mouth See admin instructions. Take one tablet every 4 hours as needed for heart rate greater than 100 08/26/21  Yes Sherran Needs, NP  dronedarone (MULTAQ) 400 MG tablet Take 400 mg by mouth 2 (two) times daily. 10/20/21  Yes [provider]  ELIQUIS 5 MG TABS tablet TAKE 1 TABLET(5 MG) BY MOUTH TWICE DAILY 06/17/21  Yes [name], MD  HYDROcodone bit-homatropine (HYCODAN) 5-1.5 MG/5ML syrup Take 5 mLs by mouth at bedtime as needed for cough. 09/18/21  Yes Frazer Rainville, Ines Bloomer, MD  Insulin Lispro Prot & Lispro (HUMALOG MIX 75/25 KWIKPEN) (75-25) 100 UNIT/ML Kwikpen Inject 25 Units into the skin 2 (two) times daily. 10/04/21  Yes [name], Melanie Crazier, MD  levothyroxine (SYNTHROID) 75 MCG tablet TAKE 1 TABLET BY MOUTH  DAILY BEFORE BREAKFAST 10/24/21  Yes Edra Riccardi, Ines Bloomer, MD  lidocaine (LIDODERM) 5 % Place 1 patch onto the skin daily. Remove & Discard patch within 12 hours or as directed by MD Patient taking differently: Place 1 patch onto the skin daily as needed (Pain). Remove & Discard patch within 12 hours or as directed by MD 08/29/20  Yes Mcarthur Rossetti, MD  LINZESS 145 MCG CAPS capsule TAKE 1 CAPSULE BY MOUTH DAILY  BEFORE BREAKFAST 10/30/21  Yes Georgena Weisheit, Ines Bloomer, MD  loperamide (IMODIUM) 2 MG capsule Take 1 capsule (2 mg total) by mouth 4 (four) times daily as needed for diarrhea or loose stools. 03/20/20  Yes Garald Balding, PA-C  losartan (COZAAR) 100 MG tablet TAKE 1 TABLET BY MOUTH  DAILY 09/07/21  Yes Romell Cavanah, Ines Bloomer, MD  metoprolol tartrate (LOPRESSOR) 25 MG tablet Take 25 mg by mouth 2 (two) times daily. 10/20/21  Yes  [provider]  NOVOFINE PLUS PEN NEEDLE 32G X 4 MM MISC USE TWICE DAILY AS DIRECTED Patient taking differently: 1 each by Other route as directed. 04/08/21  Yes Giovanne Nickolson, Ines Bloomer, MD  [facility] VERIO test strip 1 each by Other route 3 (three) times daily. 06/08/20  Yes [provider]  potassium chloride (KLOR-CON) 10 MEQ tablet Take 2 tablets (20 mEq total) by mouth daily. 03/20/21  Yes Seiler, Amber K, NP  RESTASIS MULTIDOSE 0.05 % ophthalmic emulsion Place 1 drop into both eyes 2 (two) times daily.  03/04/20  Yes [provider]  rosuvastatin (CRESTOR) 10 MG tablet TAKE 1 TABLET BY MOUTH IN  THE EVENING 04/25/21  Yes Quintin Hjort, [place], MD  Semaglutide, 1 MG/DOSE, (OZEMPIC, 1 MG/DOSE,) 4 MG/3ML SOPN Inject 1 mg into the skin once a week. Patient taking differently: Inject 1 mg into the skin every Saturday. 05/24/21  Yes [name], Melanie Crazier, MD  torsemide (DEMADEX) 20 MG tablet Take 1 tablet (20 mg total)  by mouth daily. 05/30/21  Yes Sherran Needs, NP  triamcinolone cream (KENALOG) 0.1 % Apply 1 application topically 2 (two) times daily. Patient taking differently: Apply 1 application topically 2 (two) times daily as needed (dry skin). 07/26/20  Yes Disa Riedlinger, Ines Bloomer, MD  verapamil (CALAN) 120 MG tablet Take 1 tablet (120 mg total) by mouth in the morning. 08/16/21  Yes [name], MD  vitamin B-12 (CYANOCOBALAMIN) 1000 MCG tablet Take 1,000 mcg by mouth daily.   Yes [provider]  amoxicillin-clavulanate (AUGMENTIN) 875-125 MG tablet Take 1 tablet by mouth 2 (two) times daily. Patient not taking: Reported on 10/30/2021 09/27/21   Tanda Rockers, MD    Allergies  Allergen Reactions   Doxycycline     Made patient feel very ill    Metformin Diarrhea    Patient Active Problem List   Diagnosis Date Noted   Normocytic anemia 10/11/2021   Lung nodule 09/30/2021   Cough 09/27/2021   Moderate asthma with acute exacerbation 09/27/2021    Pulmonary edema 09/27/2021   Bruising at injection site 08/03/2021   Abdominal pain 08/03/2021   CAP (community acquired pneumonia) 07/17/2021   Chills 07/17/2021   Visual hallucination 07/17/2021   Dysthymia 03/12/2021   Dysuria 03/01/2021   Primary osteoarthritis involving multiple joints 03/01/2021   Recurrent falls 01/10/2021   Sensorineural hearing loss (SNHL), 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   Persistent atrial fibrillation ([place]) 08/12/2020   Type 2 diabetes mellitus with hyperglycemia, with long-term current use of insulin ([place]) 06/21/2020   Type 2 diabetes mellitus with diabetic polyneuropathy, with long-term current use of insulin ([place]) 06/21/2020   Mixed hyperlipidemia 06/21/2020   Uncontrolled hypertension 06/11/2020   Aortic atherosclerosis ([place]) 03/28/2020   Diabetic peripheral neuropathy ([place]) 12/26/2019   Gastroesophageal reflux disease without esophagitis 03/17/2019   Bilateral leg edema 02/16/2019   Pruritus 02/16/2019   Varicose veins of both legs with edema 02/16/2019   Heart murmur 09/05/2018   Restrictive lung disease 02/22/2018   Dysgeusia 06/11/2017   Osteoarthritis of left knee 06/03/2017   NAFLD (nonalcoholic fatty liver disease) 08/07/2016   Osteoporosis 12/31/2015   Obesity 12/19/2015   Hypersomnia 12/19/2015   Edema 10/18/2015   Acquired hypothyroidism 10/18/2015   Dyspnea 92/92/4462   Acute diastolic congestive heart failure, NYHA class 3 ([place]) 10/03/2013   Prolonged QT interval 86/38/1771   Acute diastolic heart failure ([place]) 10/03/2013   Long term (current) use of anticoagulants 06/09/2012   BENIGN NEOPLASM OF ADRENAL GLAND 11/25/2010   Chronic diastolic heart failure ([place]) 02/20/2010   DM 05/16/2009   GOUT 05/16/2009   OBESITY, MORBID 05/16/2009   Hypertension associated with diabetes ([place]) 05/16/2009   Paroxysmal atrial fibrillation ([place]) 05/16/2009   HYPERLIPIDEMIA 11/30/2008   Obstructive sleep apnea on CPAP 11/30/2008    Past Medical History:   Diagnosis Date   Arthritis    Back pain    Chronic anticoagulation    due to aflutter   Chronic kidney disease    Diabetes mellitus    Diastolic CHF, chronic ([place])    a.  echo 2006 - ef 55-65%; mild diast dysfxn;    b. Echo 08/2011: Mild LVH, EF 60%;  c. 04/2013 Echo: EF 65-69%, mild conc LVH;  08/2014 Echo: EF 60-65%, mild-mod MR.   Gout    Hyperlipidemia    Hypertension    a.  Renal arterial Dopplers 12/2011: 1-59% right renal artery stenosis   Morbid obesity (HCC)    Obstructive sleep apnea on CPAP  Paroxysmal Afib/Flutter    a. 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    Past Surgical History:  Procedure Laterality Date   APPENDECTOMY     ATRIAL FLUTTER ABLATION N/A 09/24/2011   Procedure: ATRIAL FLUTTER ABLATION;  Surgeon: [name], MD;  Location: [facility] CATH LAB;  Service: Cardiovascular;  Laterality: N/A;   CARDIOVERSION  10/22/2011   Procedure: CARDIOVERSION;  Surgeon: [name], MD;  Location: [place];  Service: Cardiovascular;  Laterality: N/A;   CARDIOVERSION N/A 09/10/2011   Procedure: CARDIOVERSION;  Surgeon: [name], MD;  Location: [facility] CATH LAB;  Service: Cardiovascular;  Laterality: N/A;   CHOLECYSTECTOMY     COLONOSCOPY WITH PROPOFOL N/A 06/13/2021   Procedure: COLONOSCOPY WITH PROPOFOL;  Surgeon: Milus Banister, MD;  Location: [facility] ENDOSCOPY;  Service: Endoscopy;  Laterality: N/A;   POLYPECTOMY  06/13/2021   Procedure: POLYPECTOMY;  Surgeon: Milus Banister, MD;  Location: [facility] ENDOSCOPY;  Service: Endoscopy;;   TONSILLECTOMY  1982   TOTAL ABDOMINAL HYSTERECTOMY      Social History   Socioeconomic History   Marital status: Married    Spouse name: Not on file   Number of children: 3   Years of education: Not on file   Highest education level: Not on file  Occupational History   Occupation: DISABILITY/housewife    Employer: RETIRED  Tobacco Use   Smoking status: Never   Smokeless tobacco: Never  Vaping Use   Vaping Use: Never used  Substance  and Sexual Activity   Alcohol use: No   Drug use: No   Sexual activity: Yes  Other Topics Concern   Not on file  Social History Narrative   Not on file   Social Determinants of Health   Financial Resource Strain: Not on file  Food Insecurity: No Food Insecurity   Worried About Running Out of Food in the Last Year: Never true   Ran Out of Food in the Last Year: Never true  Transportation Needs: No Transportation Needs   Lack of Transportation (Medical): No   Lack of Transportation (Non-Medical): No  Physical Activity: Not on file  Stress: Not on file  Social Connections: Not on file  Intimate Partner Violence: Not on file    Family History  Problem Relation Age of Onset   Heart disease Father    Hypertension Father    Breast cancer Sister    Cancer Sister        breast   Colon cancer Neg Hx    Esophageal cancer Neg Hx    Pancreatic cancer Neg Hx    Liver disease Neg Hx      Review of Systems  Constitutional: Negative.  Negative for chills and fever.  HENT: Negative.  Negative for congestion and sore throat.   Respiratory: Negative.  Negative for cough and shortness of breath.   Cardiovascular: Negative.  Negative for chest pain and palpitations.  Gastrointestinal:  Negative for abdominal pain, constipation, diarrhea, nausea and vomiting.  Genitourinary: Negative.  Negative for dysuria.  Musculoskeletal: Negative.   Skin: Negative.  Negative for rash.  Neurological:  Negative for dizziness and headaches.  All other systems reviewed and are negative.  Today's Vitals   10/30/21 1303  BP: 132/62  Pulse: 78  Temp: 98.5 F (36.9 C)  TempSrc: Oral  SpO2: 90%  Weight: 241 lb (109.3 kg)  Height: '5\' 1"'  (1.549 m)   Body mass index is 45.54 kg/m.  Physical Exam Vitals reviewed.  Constitutional:  Appearance: Normal appearance. She is obese.  HENT:     Head: Normocephalic.  Eyes:     Extraocular Movements: Extraocular movements intact.     Pupils: Pupils  are equal, round, and reactive to light.  Cardiovascular:     Rate and Rhythm: Normal rate and regular rhythm.     Heart sounds: Normal heart sounds.  Pulmonary:     Effort: Pulmonary effort is normal.     Breath sounds: Normal breath sounds.  Abdominal:     Palpations: Abdomen is soft.     Tenderness: There is no abdominal tenderness.  Musculoskeletal:     Cervical back: No tenderness.  Lymphadenopathy:     Cervical: No cervical adenopathy.  Skin:    General: Skin is warm and dry.     Capillary Refill: Capillary refill takes less than 2 seconds.  Neurological:     General: No focal deficit present.     Mental Status: She is alert and oriented to person, place, and time.  Psychiatric:        Mood and Affect: Mood normal.        Behavior: Behavior normal.     ASSESSMENT & PLAN: A total of 50 minutes was spent with the patient and counseling/coordination of care regarding preparing for this visit, review of discharge summary from hospital, review of most recent office visit notes, review of all medications and changes made, review of most recent blood work results, review of most recent cardiologist office visit notes, prognosis, documentation and need for follow-up.  Problem List Items Addressed This Visit       Cardiovascular and Mediastinum   Hypertension associated with diabetes ([place])    Well-controlled hypertension.  Continue metoprolol and verapamil. BP Readings from Last 3 Encounters:  10/30/21 132/62  10/24/21 140/70  10/04/21 140/90   Lab Results  Component Value Date   HGBA1C 8.6 (H) 08/29/2021  Needs to continue follow-up with endocrinologist.  On insulin and Trulicity. Diet and nutrition discussed.        Paroxysmal atrial fibrillation (HCC)    Presently in normal sinus rhythm. Continue long-term anticoagulation. Continue Multaq 400 mg twice a day      Chronic diastolic heart failure (HCC)    Stable.        Endocrine   Type 2 diabetes mellitus  with hyperglycemia, with long-term current use of insulin ([place])     Other   Long term (current) use of anticoagulants   Other Visit Diagnoses     Hospital discharge follow-up    -  Primary      Patient Instructions  Health Maintenance After Age 63 After age 44, you are at a higher risk for certain long-term diseases and infections as well as injuries from falls. Falls are a major cause of broken bones and head injuries in people who are older than age 39. Getting regular preventive care can help to keep you healthy and well. Preventive care includes getting regular testing and making lifestyle changes as recommended by your health care provider. Talk with your health care provider about: Which screenings and tests you should have. A screening is a test that checks for a disease when you have no symptoms. A diet and exercise plan that is right for you. What should I know about screenings and tests to prevent falls? Screening and testing are the best ways to find a health problem early. Early diagnosis and treatment give you the best chance of managing medical conditions that are  common after age 1. Certain conditions and lifestyle choices may make you more likely to have a fall. Your health care provider may recommend: Regular vision checks. Poor vision and conditions such as cataracts can make you more likely to have a fall. If you wear glasses, make sure to get your prescription updated if your vision changes. Medicine review. Work with your health care provider to regularly review all of the medicines you are taking, including over-the-counter medicines. Ask your health care provider about any side effects that may make you more likely to have a fall. Tell your health care provider if any medicines that you take make you feel dizzy or sleepy. Strength and balance checks. Your health care provider may recommend certain tests to check your strength and balance while standing, walking, or  changing positions. Foot health exam. Foot pain and numbness, as well as not wearing proper footwear, can make you more likely to have a fall. Screenings, including: Osteoporosis screening. Osteoporosis is a condition that causes the bones to get weaker and break more easily. Blood pressure screening. Blood pressure changes and medicines to control blood pressure can make you feel dizzy. Depression screening. You may be more likely to have a fall if you have a fear of falling, feel depressed, or feel unable to do activities that you used to do. Alcohol use screening. Using too much alcohol can affect your balance and may make you more likely to have a fall. Follow these instructions at home: Lifestyle Do not drink alcohol if: Your health care provider tells you not to drink. If you drink alcohol: Limit how much you have to: 0-1 drink a day for women. 0-2 drinks a day for men. Know how much alcohol is in your drink. In the [place] one drink equals one 12 oz bottle of beer (355 mL), one 5 oz glass of wine (148 mL), or one 1 oz glass of hard liquor (44 mL). Do not use any products that contain nicotine or tobacco. These products include cigarettes, chewing tobacco, and vaping devices, such as e-cigarettes. If you need help quitting, ask your health care provider. Activity  Follow a regular exercise program to stay fit. This will help you maintain your balance. Ask your health care provider what types of exercise are appropriate for you. If you need a cane or walker, use it as recommended by your health care provider. Wear supportive shoes that have nonskid soles. Safety  Remove any tripping hazards, such as rugs, cords, and clutter. Install safety equipment such as grab bars in bathrooms and safety rails on stairs. Keep rooms and walkways well-lit. General instructions Talk with your health care provider about your risks for falling. Tell your health care provider if: You fall. Be sure to  tell your health care provider about all falls, even ones that seem minor. You feel dizzy, tiredness (fatigue), or off-balance. Take over-the-counter and prescription medicines only as told by your health care provider. These include supplements. Eat a healthy diet and maintain a healthy weight. A healthy diet includes low-fat dairy products, low-fat (lean) meats, and fiber from whole grains, beans, and lots of fruits and vegetables. Stay current with your vaccines. Schedule regular health, dental, and eye exams. Summary Having a healthy lifestyle and getting preventive care can help to protect your health and wellness after age 4. Screening and testing are the best way to find a health problem early and help you avoid having a fall. Early diagnosis and treatment give you the  best chance for managing medical conditions that are more common for people who are older than age 55. Falls are a major cause of broken bones and head injuries in people who are older than age 56. Take precautions to prevent a fall at home. Work with your health care provider to learn what changes you can make to improve your health and wellness and to prevent falls. This information is not intended to replace advice given to you by your health care provider. Make sure you discuss any questions you have with your health care provider. Document Revised: 02/25/2021 Document Reviewed: 02/25/2021 Elsevier Patient Education  2022 [place], [place] [facility]

## 2021-10-30 NOTE — Assessment & Plan Note (Signed)
Stable

## 2021-10-30 NOTE — Assessment & Plan Note (Signed)
Presently in normal sinus rhythm. Continue long-term anticoagulation. Continue Multaq 400 mg twice a day

## 2021-10-30 NOTE — Assessment & Plan Note (Signed)
Well-controlled hypertension.  Continue metoprolol and verapamil. BP Readings from Last 3 Encounters:  10/30/21 132/62  10/24/21 140/70  10/04/21 140/90   Lab Results  Component Value Date   HGBA1C 8.6 (H) 08/29/2021  Needs to continue follow-up with endocrinologist.  On insulin and Trulicity. Diet and nutrition discussed.

## 2021-10-30 NOTE — Patient Instructions (Signed)
Health Maintenance After Age 79 After age 79, you are at a higher risk for certain long-term diseases and infections as well as injuries from falls. Falls are a major cause of broken bones and head injuries in people who are older than age 79. Getting regular preventive care can help to keep you healthy and well. Preventive care includes getting regular testing and making lifestyle changes as recommended by your health care provider. Talk with your health care provider about: Which screenings and tests you should have. A screening is a test that checks for a disease when you have no symptoms. A diet and exercise plan that is right for you. What should I know about screenings and tests to prevent falls? Screening and testing are the best ways to find a health problem early. Early diagnosis and treatment give you the best chance of managing medical conditions that are common after age 79. Certain conditions and lifestyle choices may make you more likely to have a fall. Your health care provider may recommend: Regular vision checks. Poor vision and conditions such as cataracts can make you more likely to have a fall. If you wear glasses, make sure to get your prescription updated if your vision changes. Medicine review. Work with your health care provider to regularly review all of the medicines you are taking, including over-the-counter medicines. Ask your health care provider about any side effects that may make you more likely to have a fall. Tell your health care provider if any medicines that you take make you feel dizzy or sleepy. Strength and balance checks. Your health care provider may recommend certain tests to check your strength and balance while standing, walking, or changing positions. Foot health exam. Foot pain and numbness, as well as not wearing proper footwear, can make you more likely to have a fall. Screenings, including: Osteoporosis screening. Osteoporosis is a condition that causes  the bones to get weaker and break more easily. Blood pressure screening. Blood pressure changes and medicines to control blood pressure can make you feel dizzy. Depression screening. You may be more likely to have a fall if you have a fear of falling, feel depressed, or feel unable to do activities that you used to do. Alcohol use screening. Using too much alcohol can affect your balance and may make you more likely to have a fall. Follow these instructions at home: Lifestyle Do not drink alcohol if: Your health care provider tells you not to drink. If you drink alcohol: Limit how much you have to: 0-1 drink a day for women. 0-2 drinks a day for men. Know how much alcohol is in your drink. In the [place] one drink equals one 12 oz bottle of beer (355 mL), one 5 oz glass of wine (148 mL), or one 1 oz glass of hard liquor (44 mL). Do not use any products that contain nicotine or tobacco. These products include cigarettes, chewing tobacco, and vaping devices, such as e-cigarettes. If you need help quitting, ask your health care provider. Activity  Follow a regular exercise program to stay fit. This will help you maintain your balance. Ask your health care provider what types of exercise are appropriate for you. If you need a cane or walker, use it as recommended by your health care provider. Wear supportive shoes that have nonskid soles. Safety  Remove any tripping hazards, such as rugs, cords, and clutter. Install safety equipment such as grab bars in bathrooms and safety rails on stairs. Keep rooms and walkways   well-lit. General instructions Talk with your health care provider about your risks for falling. Tell your health care provider if: You fall. Be sure to tell your health care provider about all falls, even ones that seem minor. You feel dizzy, tiredness (fatigue), or off-balance. Take over-the-counter and prescription medicines only as told by your health care provider. These include  supplements. Eat a healthy diet and maintain a healthy weight. A healthy diet includes low-fat dairy products, low-fat (lean) meats, and fiber from whole grains, beans, and lots of fruits and vegetables. Stay current with your vaccines. Schedule regular health, dental, and eye exams. Summary Having a healthy lifestyle and getting preventive care can help to protect your health and wellness after age 79. Screening and testing are the best way to find a health problem early and help you avoid having a fall. Early diagnosis and treatment give you the best chance for managing medical conditions that are more common for people who are older than age 79. Falls are a major cause of broken bones and head injuries in people who are older than age 79. Take precautions to prevent a fall at home. Work with your health care provider to learn what changes you can make to improve your health and wellness and to prevent falls. This information is not intended to replace advice given to you by your health care provider. Make sure you discuss any questions you have with your health care provider. Document Revised: 02/25/2021 Document Reviewed: 02/25/2021 Elsevier Patient Education  2022 [employer] Inc.  

## 2021-10-31 ENCOUNTER — Encounter: Payer: Self-pay | Admitting: Adult Health

## 2021-10-31 ENCOUNTER — Ambulatory Visit (INDEPENDENT_AMBULATORY_CARE_PROVIDER_SITE_OTHER): Payer: Medicare Other

## 2021-10-31 ENCOUNTER — Ambulatory Visit (INDEPENDENT_AMBULATORY_CARE_PROVIDER_SITE_OTHER): Payer: Medicare Other | Admitting: Adult Health

## 2021-10-31 DIAGNOSIS — J810 Acute pulmonary edema: Secondary | ICD-10-CM | POA: Diagnosis not present

## 2021-10-31 DIAGNOSIS — J189 Pneumonia, unspecified organism: Secondary | ICD-10-CM | POA: Diagnosis not present

## 2021-10-31 DIAGNOSIS — J4530 Mild persistent asthma, uncomplicated: Secondary | ICD-10-CM

## 2021-10-31 DIAGNOSIS — G4733 Obstructive sleep apnea (adult) (pediatric): Secondary | ICD-10-CM | POA: Diagnosis not present

## 2021-10-31 DIAGNOSIS — I517 Cardiomegaly: Secondary | ICD-10-CM | POA: Diagnosis not present

## 2021-10-31 DIAGNOSIS — Z9989 Dependence on other enabling machines and devices: Secondary | ICD-10-CM | POA: Diagnosis not present

## 2021-10-31 NOTE — Patient Instructions (Addendum)
Continue on CPAP At bedtime   Wear all night long  Healthy sleep regimen  Do not drive if sleepy .  Work on healthy weight loss.   Continue on BREZTRI 2 puffs Twice daily , rinse after use.  Albuterol inhaler As needed .   Follow up with cardiology as planned.   Follow up with Dr. Annamaria Boots  in 3  months and As needed

## 2021-10-31 NOTE — Progress Notes (Signed)
'@Patient'  ID: [name], female    DOB: Mar 02, 1941, 81 y.o.   MRN: [id number]  Chief Complaint  Patient presents with   Follow-up    Referring provider: Horald Pollen, *  HPI: 81 year old female never smoker followed for obstructive sleep apnea and asthma and COPD, restrictive lung disease Medical history significant for diastolic congestive heart failure, atrial fibrillation on Eliquis, fatty liver  TEST/EVENTS :  NPSG 06/05/11-AHI 20.1/ hr, desaturation to  87%, body weight 261 lbs, CPAP 10  PFT 03/25/17- moderately severe obstruction, insignificant response to BD, Nl TLC, DLCO mildly reduced  2D echo October 14, 2021 on care everywhere showed EF at 55 to 60%, RV size is normal.  Systolic function is normal.   10/31/2021 Follow up: OSA and post hospital follow-up-CHF and pneumonia Patient returns for a follow-up visit.  She was recently hospitalized at [facility] for decompensated congestive heart failure, pneumonia, atrial fibrillation requiring cardioversion. She was changed from amiodarone to Multaq. Since discharge she says she is doing much better.  Her shortness of breath has significantly improved.  She has decreased cough and congestion. She remains on Breztri inhaler twice daily.  She denies any increased albuterol use.  Patient has underlying sleep apnea is on nocturnal CPAP.  Patient says she is wearing her CPAP every single night.  CPAP download was requested     Allergies  Allergen Reactions   Doxycycline     Made patient feel very ill    Metformin Diarrhea    Immunization History  Administered Date(s) Administered   Fluad Quad(high Dose 65+) 12/31/2015   Influenza Split 09/20/2011, 07/13/2012, 12/31/2015   Influenza, High Dose Seasonal PF 08/07/2016, 08/04/2017, 07/21/2018, 07/22/2019   Influenza,inj,Quad PF,6+ Mos 10/04/2013   Influenza,inj,quad, With Preservative 10/04/2013   Influenza-Unspecified 09/20/2011, 07/13/2012,  10/04/2013, 08/07/2016   Pneumococcal Conjugate-13 07/15/2012, 05/11/2014   Pneumococcal Polysaccharide-23 07/13/2012   Pneumococcal-Unspecified 07/15/2012   Zoster, Live 07/29/2012    Past Medical History:  Diagnosis Date   Arthritis    Back pain    Chronic anticoagulation    due to aflutter   Chronic kidney disease    Diabetes mellitus    Diastolic CHF, chronic ([place])    a.  echo 2006 - ef 55-65%; mild diast dysfxn;    b. Echo 08/2011: Mild LVH, EF 60%;  c. 04/2013 Echo: EF 65-69%, mild conc LVH;  08/2014 Echo: EF 60-65%, mild-mod MR.   Gout    Hyperlipidemia    Hypertension    a.  Renal arterial Dopplers 12/2011: 1-59% right renal artery stenosis   Morbid obesity ([place])    Obstructive sleep apnea on CPAP    Paroxysmal Afib/Flutter    a. 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    Tobacco History: Social History   Tobacco Use  Smoking Status Never  Smokeless Tobacco Never   Counseling given: Not Answered   Outpatient Medications Prior to Visit  Medication Sig Dispense Refill   Accu-Chek Softclix Lancets lancets 1 each by Other route 3 (three) times daily. as directed 100 each 3   allopurinol (ZYLOPRIM) 300 MG tablet TAKE 1 TABLET(300 MG) BY MOUTH DAILY 90 tablet 3   b complex vitamins capsule Take 1 capsule by mouth daily.     benzonatate (TESSALON) 100 MG capsule Take 1 capsule (100 mg total) by mouth 2 (two) times daily as needed for cough. 20 capsule 0   blood glucose meter kit and supplies Dispense based on patient and insurance preference. Use up to four  times daily as directed. (FOR ICD-10 E10.9, E11.9). 1 each 0   Budeson-Glycopyrrol-Formoterol (BREZTRI AEROSPHERE) 160-9-4.8 MCG/ACT AERO Inhale 2 puffs into the lungs in the morning and at bedtime. 5.9 g 3   cholecalciferol (VITAMIN D3) 25 MCG (1000 UNIT) tablet Take 1,000 Units by mouth daily.     cloNIDine (CATAPRES) 0.3 MG tablet Take 1 tablet (0.3 mg total) by mouth 2 (two) times daily. 180 tablet 3   Continuous  Blood Gluc Receiver (FREESTYLE LIBRE 14 DAY READER) DEVI Use as directed. 1 each 0   Continuous Blood Gluc Sensor (FREESTYLE LIBRE 14 DAY SENSOR) MISC Use as directed to check blood sugar daily 2 each 3   cyclobenzaprine (FLEXERIL) 5 MG tablet Take 1 tablet (5 mg total) by mouth as needed for muscle spasms. 30 tablet 0   diltiazem (CARDIZEM) 30 MG tablet Take 1 tablet (30 mg total) by mouth See admin instructions. Take one tablet every 4 hours as needed for heart rate greater than 100 45 tablet 2   dronedarone (MULTAQ) 400 MG tablet Take 400 mg by mouth 2 (two) times daily.     ELIQUIS 5 MG TABS tablet TAKE 1 TABLET(5 MG) BY MOUTH TWICE DAILY 180 tablet 1   HYDROcodone bit-homatropine (HYCODAN) 5-1.5 MG/5ML syrup Take 5 mLs by mouth at bedtime as needed for cough. 120 mL 0   Insulin Lispro Prot & Lispro (HUMALOG MIX 75/25 KWIKPEN) (75-25) 100 UNIT/ML Kwikpen Inject 25 Units into the skin 2 (two) times daily. 45 mL 4   levothyroxine (SYNTHROID) 75 MCG tablet TAKE 1 TABLET BY MOUTH  DAILY BEFORE BREAKFAST 90 tablet 3   lidocaine (LIDODERM) 5 % Place 1 patch onto the skin daily. Remove & Discard patch within 12 hours or as directed by MD (Patient taking differently: Place 1 patch onto the skin daily as needed (Pain). Remove & Discard patch within 12 hours or as directed by MD) 30 patch 0   LINZESS 145 MCG CAPS capsule TAKE 1 CAPSULE BY MOUTH DAILY  BEFORE BREAKFAST 60 capsule 5   loperamide (IMODIUM) 2 MG capsule Take 1 capsule (2 mg total) by mouth 4 (four) times daily as needed for diarrhea or loose stools. 12 capsule 0   losartan (COZAAR) 100 MG tablet TAKE 1 TABLET BY MOUTH  DAILY 90 tablet 3   metoprolol tartrate (LOPRESSOR) 25 MG tablet Take 25 mg by mouth 2 (two) times daily.     NOVOFINE PLUS PEN NEEDLE 32G X 4 MM MISC USE TWICE DAILY AS DIRECTED (Patient taking differently: 1 each by Other route as directed.) 100 each 11   ONETOUCH VERIO test strip 1 each by Other route 3 (three) times daily.      potassium chloride (KLOR-CON) 10 MEQ tablet Take 2 tablets (20 mEq total) by mouth daily. 180 tablet 3   RESTASIS MULTIDOSE 0.05 % ophthalmic emulsion Place 1 drop into both eyes 2 (two) times daily.      rosuvastatin (CRESTOR) 10 MG tablet TAKE 1 TABLET BY MOUTH IN  THE EVENING 90 tablet 3   Semaglutide, 1 MG/DOSE, (OZEMPIC, 1 MG/DOSE,) 4 MG/3ML SOPN Inject 1 mg into the skin once a week. (Patient taking differently: Inject 1 mg into the skin every Saturday.) 9 mL 3   torsemide (DEMADEX) 20 MG tablet Take 1 tablet (20 mg total) by mouth daily.     triamcinolone cream (KENALOG) 0.1 % Apply 1 application topically 2 (two) times daily. (Patient taking differently: Apply 1 application topically 2 (two) times  daily as needed (dry skin).) 30 g 0   verapamil (CALAN) 120 MG tablet Take 1 tablet (120 mg total) by mouth in the morning. 180 tablet 3   vitamin B-12 (CYANOCOBALAMIN) 1000 MCG tablet Take 1,000 mcg by mouth daily.     amoxicillin-clavulanate (AUGMENTIN) 875-125 MG tablet Take 1 tablet by mouth 2 (two) times daily. (Patient not taking: Reported on 10/30/2021) 14 tablet 0   Budeson-Glycopyrrol-Formoterol (BREZTRI AEROSPHERE) 160-9-4.8 MCG/ACT AERO Inhale 2 puffs into the lungs in the morning and at bedtime. (Patient not taking: Reported on 10/31/2021) 5.9 g 0   No facility-administered medications prior to visit.     Review of Systems:   Constitutional:   No  weight loss, night sweats,  Fevers, chills,  +fatigue, or  lassitude.  HEENT:   No headaches,  Difficulty swallowing,  Tooth/dental problems, or  Sore throat,                No sneezing, itching, ear ache, nasal congestion, post nasal drip,   CV:  No chest pain,  Orthopnea, PND, swelling in lower extremities, anasarca, dizziness, palpitations, syncope.   GI  No heartburn, indigestion, abdominal pain, nausea, vomiting, diarrhea, change in bowel habits, loss of appetite, bloody stools.   Resp: .  No chest wall deformity  Skin: no  rash or lesions.  GU: no dysuria, change in color of urine, no urgency or frequency.  No flank pain, no hematuria   MS:  No joint pain or swelling.  No decreased range of motion.  No back pain.    Physical Exam  BP 120/70 (BP Location: Left Arm, Patient Position: Sitting, Cuff Size: Normal)    Pulse (!) 56    Temp 98.2 F (36.8 C) (Oral)    Ht '5\' 1"'  (1.549 m)    Wt 239 lb (108.4 kg)    SpO2 99%    BMI 45.16 kg/m   GEN: A/Ox3; pleasant , NAD, well nourished ,elderly    HEENT:  [place]/AT,   NOSE-clear, THROAT-clear, no lesions, no postnasal drip or exudate noted.   NECK:  Supple w/ fair ROM; no JVD; normal carotid impulses w/o bruits; no thyromegaly or nodules palpated; no lymphadenopathy.    RESP  Clear  P & A; w/o, wheezes/ rales/ or rhonchi. no accessory muscle use, no dullness to percussion  CARD:  RRR, no m/r/g, tr  peripheral edema, pulses intact, no cyanosis or clubbing.  GI:   Soft & nt; nml bowel sounds; no organomegaly or masses detected.   Musco: Warm bil, no deformities or joint swelling noted.   Neuro: alert, no focal deficits noted.    Skin: Warm, no lesions or rashes    Lab Results:  CBC   BMET     Imaging: DG Chest 2 View  Result Date: 10/31/2021 CLINICAL DATA:  Acute edema EXAM: CHEST - 2 VIEW COMPARISON:  09/27/2021, 08/29/2021, 08/12/2021 FINDINGS: Cardiomegaly with vascular congestion. Possible tiny effusions. No focal airspace disease. Aortic atherosclerosis. IMPRESSION: Cardiomegaly with mild central congestion. Possible tiny pleural effusions. Electronically Signed   By: Donavan Foil M.D.   On: 10/31/2021 16:05    methylPREDNISolone acetate (DEPO-MEDROL) injection 80 mg     Date Action Dose Route User   09/27/2021 1240 Given 80 mg Intramuscular (Left Ventrogluteal) Dierdre Highman, RN       PFT Results Latest Ref Rng & Units 03/25/2017  FVC-Pre L 1.72  FVC-Predicted Pre % 68  FVC-Post L 1.67  FVC-Predicted Post % 66  Pre FEV1/FVC % % 61   Post FEV1/FCV % % 62  FEV1-Pre L 1.05  FEV1-Predicted Pre % 55  FEV1-Post L 1.04  DLCO uncorrected ml/min/mmHg 14.06  DLCO [facility]% % 69  DLVA Predicted % 106  TLC L 5.17  TLC % Predicted % 112  RV % Predicted % 138    No results found for: NITRICOXIDE      Assessment & Plan:   No problem-specific Assessment & Plan notes found for this encounter.     Rexene Edison, NP 10/31/2021

## 2021-11-01 ENCOUNTER — Other Ambulatory Visit: Payer: Self-pay | Admitting: Emergency Medicine

## 2021-11-01 ENCOUNTER — Telehealth: Payer: Self-pay | Admitting: Emergency Medicine

## 2021-11-01 ENCOUNTER — Telehealth: Payer: Self-pay | Admitting: Internal Medicine

## 2021-11-01 DIAGNOSIS — J45909 Unspecified asthma, uncomplicated: Secondary | ICD-10-CM | POA: Insufficient documentation

## 2021-11-01 NOTE — Telephone Encounter (Signed)
Pt c/o medication issue:  1. Name of Medication:  Breztri Inhaler  2. How are you currently taking this medication (dosage and times per day)?   3. Are you having a reaction (difficulty breathing--STAT)?   4. What is your medication issue?   Patient states her pulmonologist advised her to use Breztri Inhaler, 2 pumps twice daily. She states she read about the cardiac related side effects and she has concerns. She would like to know if Dr. Caryl Comes agrees with this. Please advise.

## 2021-11-01 NOTE — Telephone Encounter (Signed)
Spoke to patient regarding her medications.

## 2021-11-01 NOTE — Assessment & Plan Note (Signed)
Continue on CPAP.  CPAP download was requested  Plan  Patient Instructions  Continue on CPAP At bedtime   Wear all night long  Healthy sleep regimen  Do not drive if sleepy .  Work on healthy weight loss.   Continue on BREZTRI 2 puffs Twice daily , rinse after use.  Albuterol inhaler As needed .   Follow up with cardiology as planned.   Follow up with Dr. Annamaria Boots  in 3  months and As needed

## 2021-11-01 NOTE — Progress Notes (Signed)
Spoke to patient regarding medications. For blood pressure patient taking the following: Clonidine 0.3 mg twice a day, torsemide 20 mg daily, metoprolol tartrate 25 mg twice a day.  Not sure if she is taking verapamil or not. Taking Multaq 400 mg twice a day as antiarrhythmic. Taking Eliquis for long-term anticoagulation. Taking allopurinol for gout prevention. Taking Synthroid 75 mcg daily for hypothyroidism. Taking rosuvastatin for dyslipidemia. Taking insulin and Ozempic for diabetes Has history of restrictive lung disease.  Seen by pulmonary doctor yesterday and started on Breztri 2 puffs twice a day.

## 2021-11-01 NOTE — Assessment & Plan Note (Signed)
Pneumonia with recent admission.  Appears to be clinically improved.  Chest x-ray today shows no evidence of acute pneumonia continue with current regimen

## 2021-11-01 NOTE — Telephone Encounter (Signed)
I advised the pt and I will forward to Dr. Caryl Comes and our Pharm D for review.

## 2021-11-01 NOTE — Telephone Encounter (Signed)
Medication can increase heart rate and blood pressure.  However, if she can not breathe well without it, then the benefit outweighs the risks

## 2021-11-01 NOTE — Telephone Encounter (Signed)
Pt advised and says she will use it but let us know if she has any problems.

## 2021-11-01 NOTE — Assessment & Plan Note (Signed)
Appears to be under control.  Continue on current regimen  Plan  Patient Instructions  Continue on CPAP At bedtime   Wear all night long  Healthy sleep regimen  Do not drive if sleepy .  Work on healthy weight loss.   Continue on BREZTRI 2 puffs Twice daily , rinse after use.  Albuterol inhaler As needed .   Follow up with cardiology as planned.   Follow up with Dr. Annamaria Boots  in 3  months and As needed

## 2021-11-05 ENCOUNTER — Telehealth: Payer: Self-pay | Admitting: Emergency Medicine

## 2021-11-05 DIAGNOSIS — I10 Essential (primary) hypertension: Secondary | ICD-10-CM | POA: Diagnosis not present

## 2021-11-05 DIAGNOSIS — G4733 Obstructive sleep apnea (adult) (pediatric): Secondary | ICD-10-CM | POA: Diagnosis not present

## 2021-11-05 NOTE — Telephone Encounter (Signed)
While on the phone scheduling AWV, patient  stated that the cough medicine that was called in for her is too expensive and she needs something else called in. I explained to patient that I am not clinical I only schedule AWV but that I would send a message and have someone call her in regards to this.

## 2021-11-08 ENCOUNTER — Other Ambulatory Visit: Payer: Self-pay

## 2021-11-08 ENCOUNTER — Ambulatory Visit (INDEPENDENT_AMBULATORY_CARE_PROVIDER_SITE_OTHER): Payer: Medicare Other

## 2021-11-08 DIAGNOSIS — Z0000 Encounter for general adult medical examination without abnormal findings: Secondary | ICD-10-CM | POA: Diagnosis not present

## 2021-11-08 NOTE — Progress Notes (Signed)
I connected with [name] today by telephone and verified that I am speaking with the correct person using two identifiers. Location patient: home Location provider: work Persons participating in the virtual visit: patient, provider.   I discussed the limitations, risks, security and privacy concerns of performing an evaluation and management service by telephone and the availability of in person appointments. I also discussed with the patient that there may be a patient responsible charge related to this service. The patient expressed understanding and verbally consented to this telephonic visit.    Interactive audio and video telecommunications were attempted between this provider and patient, however failed, due to patient having technical difficulties OR patient did not have access to video capability.  We continued and completed visit with audio only.  Some vital signs may be absent or patient reported.   Time Spent with patient on telephone encounter: 40 minutes  Subjective:   [name] is a 81 y.o. female who presents for Medicare Annual (Subsequent) preventive examination.  Review of Systems     Cardiac Risk Factors include: advanced age (>32mn, >>59women);diabetes mellitus;dyslipidemia;family history of premature cardiovascular disease;hypertension;obesity (BMI >30kg/m2)     Objective:    Today's Vitals   11/08/21 1455  PainSc: 0-No pain   There is no height or weight on file to calculate BMI.  Advanced Directives 11/08/2021 06/13/2021 05/08/2021 03/25/2021 10/29/2020 08/21/2020 08/06/2020  Does Patient Have a Medical Advance Directive? Yes No No No No No Yes  Type of Advance Directive [place] Does patient want to make changes to medical advance directive? No - Patient declined - - No - Patient declined - - -  Copy of HPrestonin Chart? No - copy requested - - - - - -   Would patient like information on creating a medical advance directive? - No - Patient declined No - Patient declined - Yes (ED - Information included in AVS) No - Guardian declined -  Pre-existing out of facility DNR order (yellow form or pink MOST form) - - - - - - -    Current Medications (verified) Outpatient Encounter Medications as of 11/08/2021  Medication Sig   Accu-Chek Softclix Lancets lancets 1 each by Other route 3 (three) times daily. as directed   allopurinol (ZYLOPRIM) 300 MG tablet TAKE 1 TABLET(300 MG) BY MOUTH DAILY   b complex vitamins capsule Take 1 capsule by mouth daily.   benzonatate (TESSALON) 100 MG capsule Take 1 capsule (100 mg total) by mouth 2 (two) times daily as needed for cough.   blood glucose meter kit and supplies Dispense based on patient and insurance preference. Use up to four times daily as directed. (FOR ICD-10 E10.9, E11.9).   Budeson-Glycopyrrol-Formoterol (BREZTRI AEROSPHERE) 160-9-4.8 MCG/ACT AERO Inhale 2 puffs into the lungs in the morning and at bedtime.   cholecalciferol (VITAMIN D3) 25 MCG (1000 UNIT) tablet Take 1,000 Units by mouth daily.   cloNIDine (CATAPRES) 0.3 MG tablet Take 1 tablet (0.3 mg total) by mouth 2 (two) times daily.   Continuous Blood Gluc Receiver (FREESTYLE LIBRE 14 DAY READER) DEVI Use as directed.   Continuous Blood Gluc Sensor (FREESTYLE LIBRE 14 DAY SENSOR) MISC Use as directed to check blood sugar daily   cyclobenzaprine (FLEXERIL) 5 MG tablet Take 1 tablet (5 mg total) by mouth as needed for muscle spasms.   diltiazem (CARDIZEM) 30 MG tablet Take 1 tablet (30 mg total)  by mouth See admin instructions. Take one tablet every 4 hours as needed for heart rate greater than 100   dronedarone (MULTAQ) 400 MG tablet Take 400 mg by mouth 2 (two) times daily.   ELIQUIS 5 MG TABS tablet TAKE 1 TABLET(5 MG) BY MOUTH TWICE DAILY   HYDROcodone bit-homatropine (HYCODAN) 5-1.5 MG/5ML syrup Take 5 mLs by mouth at bedtime as needed for  cough.   Insulin Lispro Prot & Lispro (HUMALOG MIX 75/25 KWIKPEN) (75-25) 100 UNIT/ML Kwikpen Inject 25 Units into the skin 2 (two) times daily.   levothyroxine (SYNTHROID) 75 MCG tablet TAKE 1 TABLET BY MOUTH  DAILY BEFORE BREAKFAST   lidocaine (LIDODERM) 5 % Place 1 patch onto the skin daily. Remove & Discard patch within 12 hours or as directed by MD (Patient taking differently: Place 1 patch onto the skin daily as needed (Pain). Remove & Discard patch within 12 hours or as directed by MD)   LINZESS 145 MCG CAPS capsule TAKE 1 CAPSULE BY MOUTH DAILY  BEFORE BREAKFAST   loperamide (IMODIUM) 2 MG capsule Take 1 capsule (2 mg total) by mouth 4 (four) times daily as needed for diarrhea or loose stools.   metoprolol tartrate (LOPRESSOR) 25 MG tablet Take 25 mg by mouth 2 (two) times daily.   NOVOFINE PLUS PEN NEEDLE 32G X 4 MM MISC USE TWICE DAILY AS DIRECTED (Patient taking differently: 1 each by Other route as directed.)   ONETOUCH VERIO test strip 1 each by Other route 3 (three) times daily.   potassium chloride (KLOR-CON) 10 MEQ tablet Take 2 tablets (20 mEq total) by mouth daily.   RESTASIS MULTIDOSE 0.05 % ophthalmic emulsion Place 1 drop into both eyes 2 (two) times daily.    rosuvastatin (CRESTOR) 10 MG tablet TAKE 1 TABLET BY MOUTH IN  THE EVENING   Semaglutide, 1 MG/DOSE, (OZEMPIC, 1 MG/DOSE,) 4 MG/3ML SOPN Inject 1 mg into the skin once a week. (Patient taking differently: Inject 1 mg into the skin every Saturday.)   torsemide (DEMADEX) 20 MG tablet Take 1 tablet (20 mg total) by mouth daily.   triamcinolone cream (KENALOG) 0.1 % Apply 1 application topically 2 (two) times daily. (Patient taking differently: Apply 1 application topically 2 (two) times daily as needed (dry skin).)   verapamil (CALAN) 120 MG tablet Take 1 tablet (120 mg total) by mouth in the morning.   vitamin B-12 (CYANOCOBALAMIN) 1000 MCG tablet Take 1,000 mcg by mouth daily.   No facility-administered encounter  medications on file as of 11/08/2021.    Allergies (verified) Doxycycline and Metformin   History: Past Medical History:  Diagnosis Date   Arthritis    Back pain    Chronic anticoagulation    due to aflutter   Chronic kidney disease    Diabetes mellitus    Diastolic CHF, chronic ([place])    a.  echo 2006 - ef 55-65%; mild diast dysfxn;    b. Echo 08/2011: Mild LVH, EF 60%;  c. 04/2013 Echo: EF 65-69%, mild conc LVH;  08/2014 Echo: EF 60-65%, mild-mod MR.   Gout    Hyperlipidemia    Hypertension    a.  Renal arterial Dopplers 12/2011: 1-59% right renal artery stenosis   Morbid obesity ([place])    Obstructive sleep apnea on CPAP    Paroxysmal Afib/Flutter    a. 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   Past Surgical History:  Procedure Laterality Date   APPENDECTOMY     ATRIAL FLUTTER ABLATION N/A 09/24/2011  Procedure: ATRIAL FLUTTER ABLATION;  Surgeon: [name], MD;  Location: [facility] CATH LAB;  Service: Cardiovascular;  Laterality: N/A;   CARDIOVERSION  10/22/2011   Procedure: CARDIOVERSION;  Surgeon: [name], MD;  Location: [place];  Service: Cardiovascular;  Laterality: N/A;   CARDIOVERSION N/A 09/10/2011   Procedure: CARDIOVERSION;  Surgeon: [name], MD;  Location: [facility] CATH LAB;  Service: Cardiovascular;  Laterality: N/A;   CHOLECYSTECTOMY     COLONOSCOPY WITH PROPOFOL N/A 06/13/2021   Procedure: COLONOSCOPY WITH PROPOFOL;  Surgeon: Milus Banister, MD;  Location: [facility] ENDOSCOPY;  Service: Endoscopy;  Laterality: N/A;   POLYPECTOMY  06/13/2021   Procedure: POLYPECTOMY;  Surgeon: Milus Banister, MD;  Location: [facility] ENDOSCOPY;  Service: Endoscopy;;   TONSILLECTOMY  1982   TOTAL ABDOMINAL HYSTERECTOMY     Family History  Problem Relation Age of Onset   Heart disease Father    Hypertension Father    Breast cancer Sister    Cancer Sister        breast   Colon cancer Neg Hx    Esophageal cancer Neg Hx    Pancreatic cancer Neg Hx    Liver disease Neg Hx    Social  History   Socioeconomic History   Marital status: Married    Spouse name: Not on file   Number of children: 3   Years of education: Not on file   Highest education level: Not on file  Occupational History   Occupation: DISABILITY/housewife    Employer: RETIRED  Tobacco Use   Smoking status: Never   Smokeless tobacco: Never  Vaping Use   Vaping Use: Never used  Substance and Sexual Activity   Alcohol use: No   Drug use: No   Sexual activity: Yes  Other Topics Concern   Not on file  Social History Narrative   Not on file   Social Determinants of Health   Financial Resource Strain: Low Risk    Difficulty of Paying Living Expenses: Not hard at all  Food Insecurity: No Food Insecurity   Worried About Charity fundraiser in the Last Year: Never true   Ran Out of Food in the Last Year: Never true  Transportation Needs: No Transportation Needs   Lack of Transportation (Medical): No   Lack of Transportation (Non-Medical): No  Physical Activity: Inactive   Days of Exercise per Week: 0 days   Minutes of Exercise per Session: 0 min  Stress: No Stress Concern Present   Feeling of Stress : Not at all  Social Connections: Socially Integrated   Frequency of Communication with Friends and Family: More than three times a week   Frequency of Social Gatherings with Friends and Family: Never   Attends Religious Services: More than 4 times per year   Active Member of [employer] or Organizations: Yes   Attends Music therapist: More than 4 times per year   Marital Status: Married    Tobacco Counseling Counseling given: Not Answered   Clinical Intake:  Pre-visit preparation completed: Yes  Pain : No/denies pain Pain Score: 0-No pain     Nutritional Risks: None Diabetes: Yes CBG done?: No Did pt. bring in CBG monitor from home?: No  How often do you need to have someone help you when you read instructions, pamphlets, or other written materials from your doctor or  pharmacy?: 1 - Never What is the last grade level you completed in school?: 9th grade  Diabetic? yes  Interpreter  Needed?: No  Information entered by :: Lisette Abu, LPN   Activities of Daily Living In your present state of health, do you have any difficulty performing the following activities: 11/08/2021  Hearing? Y  Vision? N  Difficulty concentrating or making decisions? N  Walking or climbing stairs? Y  Dressing or bathing? N  Doing errands, shopping? N  Preparing Food and eating ? N  Using the Toilet? N  In the past six months, have you accidently leaked urine? Y  Do you have problems with loss of bowel control? N  Managing your Medications? N  Managing your Finances? N  Housekeeping or managing your Housekeeping? N  Some recent data might be hidden    Patient Care Team: Horald Pollen, MD as PCP - General (Internal Medicine) [name], MD as PCP - Electrophysiology (Cardiology) Shawnee Knapp, MD as Resident (Family Medicine) Knox Royalty, RN as Case Manager [place], Darnelle Maffucci, [facility] as Pharmacist (Pharmacist) Gevena Cotton, MD as Consulting Physician (Ophthalmology)  Indicate any recent Medical Services you may have received from other than [facility] providers in the past year (date may be approximate).     Assessment:   This is a routine wellness examination for [name].  Hearing/Vision screen Hearing Screening - Comments:: Patient has hearing difficulty. No hearing aids. Vision Screening - Comments:: Patient wears corrective glasses/contacts.  Eye exam done annually by: Dr[facility] Gevena Cotton at [facility]  Dietary issues and exercise activities discussed: Current Exercise Habits: The patient does not participate in regular exercise at present, Exercise limited by: orthopedic condition(s);respiratory conditions(s);cardiac condition(s)   Goals Addressed   None   Depression Screen PHQ 2/9 Scores 11/08/2021 07/24/2021 03/25/2021 01/23/2021  01/09/2021 12/12/2020 11/29/2020  PHQ - 2 Score 0 0 0 0 0 0 0    Fall Risk Fall Risk  11/08/2021 07/24/2021 03/25/2021 01/23/2021 01/09/2021  Falls in the past year? 1 0 1 1 0  Number falls in past yr: 1 0 1 1 0  Comment - - - - -  Injury with Fall? 0 0 1 1 0  Comment - - - - -  Risk for fall due to : History of fall(s);Impaired balance/gait - Medication side effect;Impaired mobility History of fall(s) -  Follow up - - Education provided;Falls prevention discussed Falls evaluation completed Falls evaluation completed    FALL RISK PREVENTION PERTAINING TO THE HOME:  Any stairs in or around the home? No  If so, are there any without handrails? No  Home free of loose throw rugs in walkways, pet beds, electrical cords, etc? Yes  Adequate lighting in your home to reduce risk of falls? Yes   ASSISTIVE DEVICES UTILIZED TO PREVENT FALLS:  Life alert? No  Use of a cane, walker or w/c? Yes  Grab bars in the bathroom? Yes  Shower chair or bench in shower? Yes  Elevated toilet seat or a handicapped toilet? Yes   TIMED UP AND GO:  Was the test performed? No .  Length of time to ambulate 10 feet: n/a sec.   Gait slow and steady with assistive device  Cognitive Function: Normal cognitive status assessed by direct observation by this Nurse Health Advisor. No abnormalities found.       6CIT Screen 10/29/2020  What Year? 0 points  What month? 0 points  What time? 0 points  Count back from 20 0 points  Months in reverse 0 points  Repeat phrase 0 points  Total Score 0  Immunizations Immunization History  Administered Date(s) Administered   Fluad Quad(high Dose 65+) 12/31/2015   Influenza Split 09/20/2011, 07/13/2012, 12/31/2015   Influenza, High Dose Seasonal PF 08/07/2016, 08/04/2017, 07/21/2018, 07/22/2019   Influenza,inj,Quad PF,6+ Mos 10/04/2013   Influenza,inj,quad, With Preservative 10/04/2013   Influenza-Unspecified 09/20/2011, 07/13/2012, 10/04/2013, 08/07/2016   Pneumococcal  Conjugate-13 07/15/2012, 05/11/2014   Pneumococcal Polysaccharide-23 07/13/2012   Pneumococcal-Unspecified 07/15/2012   Zoster, Live 07/29/2012    TDAP status: Due, Education has been provided regarding the importance of this vaccine. Advised may receive this vaccine at local pharmacy or Health Dept. Aware to provide a copy of the vaccination record if obtained from local pharmacy or Health Dept. Verbalized acceptance and understanding.  Flu Vaccine status: Due, Education has been provided regarding the importance of this vaccine. Advised may receive this vaccine at local pharmacy or Health Dept. Aware to provide a copy of the vaccination record if obtained from local pharmacy or Health Dept. Verbalized acceptance and understanding.  Pneumococcal vaccine status: Up to date  Covid-19 vaccine status: Declined, Education has been provided regarding the importance of this vaccine but patient still declined. Advised may receive this vaccine at local pharmacy or Health Dept.or vaccine clinic. Aware to provide a copy of the vaccination record if obtained from local pharmacy or Health Dept. Verbalized acceptance and understanding.  Qualifies for Shingles Vaccine? Yes   Zostavax completed Yes   Shingrix Completed?: No.    Education has been provided regarding the importance of this vaccine. Patient has been advised to call insurance company to determine out of pocket expense if they have not yet received this vaccine. Advised may also receive vaccine at local pharmacy or Health Dept. Verbalized acceptance and understanding.  Screening Tests Health Maintenance  Topic Date Due   Hepatitis C Screening  Never done   Zoster Vaccines- Shingrix (1 of 2) Never done   OPHTHALMOLOGY EXAM  04/18/2021   COVID-19 Vaccine (1) 11/16/2021 (Originally 04/09/1942)   INFLUENZA VACCINE  01/17/2022 (Originally 05/20/2021)   FOOT EXAM  02/28/2022 (Originally 10/09/1951)   TETANUS/TDAP  02/28/2022 (Originally 10/09/1960)    HEMOGLOBIN A1C  02/26/2022   Pneumonia Vaccine 85+ Years old  Completed   DEXA SCAN  Completed   HPV VACCINES  Aged Out   COLONOSCOPY (Pts 45-47yr Insurance coverage will need to be confirmed)  Discontinued    Health Maintenance  Health Maintenance Due  Topic Date Due   Hepatitis C Screening  Never done   Zoster Vaccines- Shingrix (1 of 2) Never done   OPHTHALMOLOGY EXAM  04/18/2021    Colorectal cancer screening: No longer required.   Mammogram status: Completed 03/12/2021. Repeat every year  Lung Cancer Screening: (Low Dose CT Chest recommended if Age 79-80years, 30 pack-year currently smoking OR have quit w/in 15years.) does not qualify.   Lung Cancer Screening Referral: no  Additional Screening:  Hepatitis C Screening: does not qualify; Completed no  Vision Screening: Recommended annual ophthalmology exams for early detection of glaucoma and other disorders of the eye. Is the patient up to date with their annual eye exam?  Yes  Who is the provider or what is the name of the office in which the patient attends annual eye exams? [facility] not [facility] a provider, would they like to be referred to a provider to establish care? No .   Dental Screening: Recommended annual dental exams for proper oral hygiene  Community Resource Referral / Chronic Care  Management: CRR required this visit?  No   CCM required this visit?  No      Plan:     I have personally reviewed and noted the following in the patients chart:   Medical and social history Use of alcohol, tobacco or illicit drugs  Current medications and supplements including opioid prescriptions.  Functional ability and status Nutritional status Physical activity Advanced directives List of other physicians Hospitalizations, surgeries, and ER visits in previous 12 months Vitals Screenings to include cognitive, depression, and falls Referrals and  appointments  In addition, I have reviewed and discussed with patient certain preventive protocols, quality metrics, and best practice recommendations. A written personalized care plan for preventive services as well as general preventive health recommendations were provided to patient.     [name], LPN   [id number]   Nurse Notes:  Patient is cogitatively intact. There were no vitals filed for this visit. There is no height or weight on file to calculate BMI.

## 2021-11-10 ENCOUNTER — Other Ambulatory Visit: Payer: Self-pay | Admitting: Nurse Practitioner

## 2021-11-10 DIAGNOSIS — R0602 Shortness of breath: Secondary | ICD-10-CM

## 2021-11-10 DIAGNOSIS — J45901 Unspecified asthma with (acute) exacerbation: Secondary | ICD-10-CM

## 2021-11-11 ENCOUNTER — Other Ambulatory Visit: Payer: Self-pay | Admitting: Emergency Medicine

## 2021-11-14 LAB — HEMOGLOBIN A1C: Hemoglobin A1C: 8.3

## 2021-11-14 LAB — HM DEXA SCAN

## 2021-11-15 ENCOUNTER — Telehealth: Payer: Self-pay | Admitting: Internal Medicine

## 2021-11-15 ENCOUNTER — Ambulatory Visit: Payer: Medicare Other | Admitting: Internal Medicine

## 2021-11-15 NOTE — Telephone Encounter (Signed)
Spoke with pt who reports she continues to have increased urination.  Pt advised this is to be expected with Torsemide.  Pt denies UTI S/Sx.  She is concerned about her potassium level.  She reports she has not been taking her KCL.  Pt advised according to medication list she should be taking [id number] - 2 tablets by mouth daily.  Looking back at last 2 OV notes no medication changes were made.  Pt states she will restart KCL and continue Torsemide.  Pt will plan to keep 12/04/2021 appointment for follow up and she is hoping for labs to recheck K+ at that time.

## 2021-11-15 NOTE — Telephone Encounter (Signed)
New message   Pt states that the RN that comes from [facility] told her to follow up with Dr. Caryl Comes because she has been urinating a lot and there are concerns about her potassium. Dr. Caryl Comes wanted her to f/u with EP APP but next available appt is 2.15.23

## 2021-11-18 ENCOUNTER — Telehealth: Payer: Medicare Other

## 2021-11-19 DIAGNOSIS — I5032 Chronic diastolic (congestive) heart failure: Secondary | ICD-10-CM | POA: Diagnosis not present

## 2021-11-19 DIAGNOSIS — E1142 Type 2 diabetes mellitus with diabetic polyneuropathy: Secondary | ICD-10-CM

## 2021-11-19 DIAGNOSIS — Z794 Long term (current) use of insulin: Secondary | ICD-10-CM | POA: Diagnosis not present

## 2021-11-19 DIAGNOSIS — I480 Paroxysmal atrial fibrillation: Secondary | ICD-10-CM | POA: Diagnosis not present

## 2021-11-20 ENCOUNTER — Telehealth: Payer: Self-pay | Admitting: Internal Medicine

## 2021-11-20 ENCOUNTER — Telehealth: Payer: Self-pay

## 2021-11-20 DIAGNOSIS — I10 Essential (primary) hypertension: Secondary | ICD-10-CM | POA: Diagnosis not present

## 2021-11-20 DIAGNOSIS — R0683 Snoring: Secondary | ICD-10-CM | POA: Diagnosis not present

## 2021-11-20 DIAGNOSIS — G4733 Obstructive sleep apnea (adult) (pediatric): Secondary | ICD-10-CM | POA: Diagnosis not present

## 2021-11-20 NOTE — Telephone Encounter (Signed)
Pt states that she hasn't been sleeping that good.  Pt states that she is seeing things when she close her eyes.  Pt is wanting another referral to seek some help with this issue.  Please advise [phone]

## 2021-11-20 NOTE — Telephone Encounter (Signed)
Patient called to see how much water she should be drinking. Because is going to the bathroom at lot, and its messing with her potassium.  Please advise

## 2021-11-21 DIAGNOSIS — R350 Frequency of micturition: Secondary | ICD-10-CM | POA: Diagnosis not present

## 2021-11-21 DIAGNOSIS — N3020 Other chronic cystitis without hematuria: Secondary | ICD-10-CM | POA: Diagnosis not present

## 2021-11-21 NOTE — Telephone Encounter (Signed)
Called and spoke with someone at [facility] were a referral was placed in October of 2022. They state that patint declined services due to insurance. There has been a few referrals placed for this concern.

## 2021-11-21 NOTE — Telephone Encounter (Signed)
Recommend over-the-counter sleeping aids like melatonin 10 mg.  She has been referred to behavioral health several times.  Thanks.

## 2021-11-22 NOTE — Telephone Encounter (Signed)
Called and spoke with pt about Dr. Mitchel Honour recommendations.

## 2021-11-22 NOTE — Telephone Encounter (Signed)
Spoke with pt who states she is drinking about 48oz of fluids daily.  She continues to urinate briskly with Torsemide and is taking her KCL as prescribed.  No complaints of CP, SOB or edema.  Pt advised to continue current regimen and follow up.  Contact RN for further questions or concerns.  Pt has f/u appointment with Oda Kilts, PA-C 12/04/2021.  Pt verbalizes understanding and agrees with current plan.

## 2021-11-27 ENCOUNTER — Ambulatory Visit (INDEPENDENT_AMBULATORY_CARE_PROVIDER_SITE_OTHER): Payer: Medicare Other | Admitting: *Deleted

## 2021-11-27 DIAGNOSIS — I5032 Chronic diastolic (congestive) heart failure: Secondary | ICD-10-CM

## 2021-11-27 DIAGNOSIS — E1165 Type 2 diabetes mellitus with hyperglycemia: Secondary | ICD-10-CM

## 2021-11-27 NOTE — Chronic Care Management (AMB) (Signed)
Chronic Care Management   CCM RN Visit Note  11/27/2021 Name: [name] MRN: [id number] DOB: 1941-07-19  Subjective: [name] is a 81 y.o. year old female who is a primary care patient of Sagardia, Ines Bloomer, MD. The care management team was consulted for assistance with disease management and care coordination needs.    Engaged with patient by telephone for follow up visit in response to provider referral for case management and/or care coordination services.   Consent to Services:  The patient was given information about Chronic Care Management services, agreed to services, and gave verbal consent prior to initiation of services.  Please see initial visit note for detailed documentation.  Patient agreed to services and verbal consent obtained.   Assessment: Review of patient past medical history, allergies, medications, health status, including review of consultants reports, laboratory and other test data, was performed as part of comprehensive evaluation and provision of chronic care management services.   SDOH (Social Determinants of Health) assessments and interventions performed:  SDOH Interventions    Flowsheet Row Most Recent Value  SDOH Interventions   Food Insecurity Interventions Intervention Not Indicated  Transportation Interventions Intervention Not Indicated  [Patient drives self]      CCM Care Plan  Allergies  Allergen Reactions   Doxycycline     Made patient feel very ill    Metformin Diarrhea   Outpatient Encounter Medications as of 11/27/2021  Medication Sig Note   Accu-Chek Softclix Lancets lancets 1 each by Other route 3 (three) times daily. as directed    allopurinol (ZYLOPRIM) 300 MG tablet TAKE 1 TABLET(300 MG) BY MOUTH DAILY    b complex vitamins capsule Take 1 capsule by mouth daily.    benzonatate (TESSALON) 100 MG capsule Take 1 capsule (100 mg total) by mouth 2 (two) times daily as needed for cough.    blood glucose meter kit and  supplies Dispense based on patient and insurance preference. Use up to four times daily as directed. (FOR ICD-10 E10.9, E11.9).    BREZTRI AEROSPHERE 160-9-4.8 MCG/ACT AERO USE 2 INHALATIONS BY MOUTH IN  THE MORNING AND AT BEDTIME    cholecalciferol (VITAMIN D3) 25 MCG (1000 UNIT) tablet Take 1,000 Units by mouth daily.    cloNIDine (CATAPRES) 0.3 MG tablet Take 1 tablet (0.3 mg total) by mouth 2 (two) times daily.    Continuous Blood Gluc Receiver (FREESTYLE LIBRE 14 DAY READER) DEVI Use as directed.    Continuous Blood Gluc Sensor (FREESTYLE LIBRE 14 DAY SENSOR) MISC Use as directed to check blood sugar daily    cyclobenzaprine (FLEXERIL) 5 MG tablet Take 1 tablet (5 mg total) by mouth as needed for muscle spasms.    diltiazem (CARDIZEM) 30 MG tablet Take 1 tablet (30 mg total) by mouth See admin instructions. Take one tablet every 4 hours as needed for heart rate greater than 100    dronedarone (MULTAQ) 400 MG tablet Take 400 mg by mouth 2 (two) times daily.    ELIQUIS 5 MG TABS tablet TAKE 1 TABLET(5 MG) BY MOUTH TWICE DAILY    HYDROcodone bit-homatropine (HYCODAN) 5-1.5 MG/5ML syrup Take 5 mLs by mouth at bedtime as needed for cough.    Insulin Lispro Prot & Lispro (HUMALOG MIX 75/25 KWIKPEN) (75-25) 100 UNIT/ML Kwikpen Inject 25 Units into the skin 2 (two) times daily. 11/27/2021: 11/27/21- patient reports consistently taking 15-17 Units BID; adjusts around blood sugars at home-- reports was "too low" with 25 Units   levothyroxine (SYNTHROID) 75  MCG tablet TAKE 1 TABLET BY MOUTH  DAILY BEFORE BREAKFAST    lidocaine (LIDODERM) 5 % Place 1 patch onto the skin daily. Remove & Discard patch within 12 hours or as directed by MD (Patient taking differently: Place 1 patch onto the skin daily as needed (Pain). Remove & Discard patch within 12 hours or as directed by MD)    LINZESS 145 MCG CAPS capsule TAKE 1 CAPSULE BY MOUTH DAILY  BEFORE BREAKFAST    loperamide (IMODIUM) 2 MG capsule Take 1 capsule (2 mg  total) by mouth 4 (four) times daily as needed for diarrhea or loose stools.    metoprolol tartrate (LOPRESSOR) 25 MG tablet TAKE 1 TABLET(25 MG) BY MOUTH TWICE DAILY    NOVOFINE PLUS PEN NEEDLE 32G X 4 MM MISC USE TWICE DAILY AS DIRECTED (Patient taking differently: 1 each by Other route as directed.)    ONETOUCH VERIO test strip 1 each by Other route 3 (three) times daily.    potassium chloride (KLOR-CON) 10 MEQ tablet Take 2 tablets (20 mEq total) by mouth daily.    RESTASIS MULTIDOSE 0.05 % ophthalmic emulsion Place 1 drop into both eyes 2 (two) times daily.     rosuvastatin (CRESTOR) 10 MG tablet TAKE 1 TABLET BY MOUTH IN  THE EVENING    Semaglutide, 1 MG/DOSE, (OZEMPIC, 1 MG/DOSE,) 4 MG/3ML SOPN Inject 1 mg into the skin once a week. (Patient taking differently: Inject 1 mg into the skin every Saturday.)    torsemide (DEMADEX) 20 MG tablet Take 1 tablet (20 mg total) by mouth daily.    triamcinolone cream (KENALOG) 0.1 % Apply 1 application topically 2 (two) times daily. (Patient taking differently: Apply 1 application topically 2 (two) times daily as needed (dry skin).)    verapamil (CALAN) 120 MG tablet Take 1 tablet (120 mg total) by mouth in the morning.    vitamin B-12 (CYANOCOBALAMIN) 1000 MCG tablet Take 1,000 mcg by mouth daily.    No facility-administered encounter medications on file as of 11/27/2021.   Patient Active Problem List   Diagnosis Date Noted   Asthma 11/01/2021   Normocytic anemia 10/11/2021   Lung nodule 09/30/2021   Pneumonia 07/17/2021   Dysthymia 03/12/2021   Dysuria 03/01/2021   Primary osteoarthritis involving multiple joints 03/01/2021   Recurrent falls 01/10/2021   Sensorineural hearing loss (SNHL), 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   Persistent atrial fibrillation ([place]) 08/12/2020   Type 2 diabetes mellitus with hyperglycemia, with long-term current use of insulin ([place]) 06/21/2020   Type 2 diabetes mellitus with diabetic polyneuropathy, with long-term current use of  insulin ([place]) 06/21/2020   Mixed hyperlipidemia 06/21/2020   Uncontrolled hypertension 06/11/2020   Aortic atherosclerosis ([place]) 03/28/2020   Diabetic peripheral neuropathy ([place]) 12/26/2019   Gastroesophageal reflux disease without esophagitis 03/17/2019   Bilateral leg edema 02/16/2019   Varicose veins of both legs with edema 02/16/2019   Heart murmur 09/05/2018   Restrictive lung disease 02/22/2018   Dysgeusia 06/11/2017   Osteoarthritis of left knee 06/03/2017   NAFLD (nonalcoholic fatty liver disease) 08/07/2016   Osteoporosis 12/31/2015   Obesity 12/19/2015   Edema 10/18/2015   Acquired hypothyroidism 10/18/2015   Prolonged QT interval 10/03/2013   Long term (current) use of anticoagulants 06/09/2012   BENIGN NEOPLASM OF ADRENAL GLAND 11/25/2010   Chronic diastolic heart failure ([place]) 02/20/2010   DM 05/16/2009   GOUT 05/16/2009   OBESITY, MORBID 05/16/2009   Hypertension associated with diabetes ([place]) 05/16/2009   Paroxysmal atrial fibrillation ([place]) 05/16/2009  HYPERLIPIDEMIA 11/30/2008   Obstructive sleep apnea on CPAP 11/30/2008   Conditions to be addressed/monitored:  CHF and DMII  Care Plan : RN Care Manager Plan of Care  Updates made by Knox Royalty, RN since 11/27/2021 12:00 AM     Problem: Chronic Disease Management Needs   Priority: High     Long-Range Goal: Ongoing adherence to established care plan for long term chronic disease management   Start Date: 10/09/2021  Expected End Date: 10/09/2022  Priority: High  Note:   Current Barriers:  Chronic Disease Management support and education needs related to Atrial Fibrillation, CHF, and DMII Fragile state of health, multiple progressing chronic health conditions-- requires ongoing reinforcement/ support for self-health management of multiple co-morbidities Recent hospitalization October 11, 2021- October 20, 2021: A-Fib with RVR; reports was at [facility]- patient reports received cardioversion during  hospitalization  RNCM Clinical Goal(s):  Patient will demonstrate ongoing health management independence as evidenced by adherence to established plan of care for CHF/ AF, and DMII        through collaboration with RN Care manager, provider, and care team.   Interventions: 1:1 collaboration with primary care provider regarding development and update of comprehensive plan of care as evidenced by provider attestation and co-signature Inter-disciplinary care team collaboration (see longitudinal plan of care) Evaluation of current treatment plan related to  self management and patient's adherence to plan as established by provider Confirmed no clinical concerns, issues/ problems: reports "doing okay today;" tells me about a episode of shortness of breath she had yesterday when rushing to get to dentist office-- tells me she had to rest, but eventually recovered completely SDOH updated; no new concerns/ unmet needs identified Pain Assessment updated: denies acute/ chronic pain Falls assessment updated: denies new/ recent falls since last outreach 10/22/21- continues using cane when she is outside of home; fall risks/ prevention education reinforced; encouraged patient to NOT RUSH and explained that rushing is a very high risk factor for experiencing a fall Confirmed no medication changes/ concerns- patient continues to endorse adherence to medication regimen and continues to self-manage  Reviewed recent PCP office visit 10/30/21- confirmed no changes to plan of care/ medications  Heart Failure Interventions:  (Status: 11/27/21: Goal on Track (progressing): YES.)  Long Term Goal  Basic overview and discussion of pathophysiology of Heart Failure reviewed Discussed importance of daily weight and advised patient to weigh and record daily Discussed the importance of keeping all appointments with provider Reviewed recent weights at home: reports general weights between 239-241; reports weight this morning of  "240 lbs:" this represents previous reports- at baseline; she denies signs/ symptoms CHF yellow zone; reports "breathing is okay today;" stated she has resumed using CPAP unit now that (previously reported) cough is "better" Confirmed patient continues trying to follow low sodium heart healthy diet Reinforced previously provided education around signs/ symptoms yellow CHF zone, along with corresponding action plan: she verbalizes good understanding of same and will benefit from ongoing reinforcement and support of CHF self-health management Reviewed upcoming scheduled provider office visits: 12/04/21- cardiology; 12/13/21- pulmonary provider; 12/23/21- CT scan; she verbalizes awareness of all appointments with plans to attend as scheduled Reinforced previously provided education around signs/ symptoms A-Fib, along with corresponding action plan for changes in rate/ symptoms: patient denies all today  Diabetes:  (Status: 11/27/21- Goal on Track (progressing): YES.) Long Term Goal   Lab Results  Component Value Date   HGBA1C 8.6 (H) 08/29/2021  Counseled on importance of regular laboratory  monitoring as prescribed;        °Discussed plans with patient for ongoing care management follow up and provided patient with direct contact information for care management team;      °Review of patient status, including review of consultants reports, relevant laboratory and other test results, and medications completed;       °Confirmed continues "trying" to follow carb-modified, low sugar diet; however, she continues eating white rice weekly-- discussed with patient value of portion control °Reviewed recent blood sugars at home-- they have been "all fine" per patient report: reports consistent values between 120-160, both with fasting and post-prandial values: reports fasting this morning of "121" °Assessed patient's ongoing understanding of meaning/ significance of A1-C values: good baseline understanding of same-  Reviewed individual historical A1-C trends and provided education around correlation of A1-C value to blood sugar levels at home over 3 months-- patient continues to report that she believes her last A1-C was increased due to steroid therapy when she was hospitalized in December-- however, upon review of EHR, her A1-C during hospitalization in December ([facility]) was 8.2 (10/12/21); which is actually lower than her last POC test result °Provided education around goals for both fasting and post-prandial blood sugars at home-- will provide additional printed information as well °Discussed best times to monitor blood sugars: fasting am/ and then again 2-hours after a meal, as well as anytime in between as indicated around development of signs/ symptoms low blood sugar °Attempted to talk patient through using the "average" function on her FSL reader-- unfortunately, she was not able to locate how to use the average function on her FSL, she stated, "I just don't really know how to use this very well other than to just check my blood sugar" °Confirmed that patient continues taking Humalog insulin, adjusts around her blood sugars, reports "mainly" taking between 15-17 U BID;" again tells me that she was not able to tolerate dose > 20 U due to hypoglycemia   °Confirmed patient has continued taking Ozempic q week as prescribed ° °Patient Goals/Self-Care Activities: °As evidenced by review of EHR, collaboration with care team, and patient reporting during CCM RN CM outreach,   °Patient Esmay will: °Take medications as prescribed °Attend all scheduled provider appointments °Call pharmacy for medication refills °Call provider office for new concerns or questions °Continue to check fasting (first thing in the morning, before eating) and 2-hours AFTER eating blood sugars at home-- this will help your doctors know if your medications are working or if they need adjusting: we will review these values each time we talk over  the phone  °Your blood sugar goal for first thing in the morning before eating is between 110-130 °Your blood sugar goal for 2-hours after eating is between 160-180 °Continue to monitor and write down on paper your morning weights every day: if you gain more than 3 lbs overnight, or 5 lbs in one week: please call your cardiology provider for instructions as to what you should do: you reported a weight this morning of 240 lbs: this is right in line with the previous weights you have been reporting °Watch for swelling in feet, ankles and legs every day °Wear the compression stockings that the cardiology staff recommended on days that you notice swelling in your feet/ ankles °Continue to follow heart healthy, low salt, low cholesterol, carbohydrate-modified, low sugar diet °Try to stay as active as possible, and please take effort to not rush-- rushing will increase your anxiety and may cause   you to fall Keep up the great work preventing falls-- continue to use your cane    Plan: Telephone follow up appointment with care management team member scheduled for:  Tuesday, January 07, 2022 at 11:30 am The patient has been provided with contact information for the care management team and has been advised to call with any health related questions or concerns  [name], RN, BSN, [place] [phone]: direct office

## 2021-11-27 NOTE — Patient Instructions (Signed)
Visit [place], thank you for taking time to talk with me today. Please don't hesitate to contact me if I can be of assistance to you before our next scheduled telephone appointment.  Below are the goals we discussed today:  Patient Self-Care Activities: [name] will: Take medications as prescribed Attend all scheduled provider appointments Call pharmacy for medication refills Call provider office for new concerns or questions Continue to check fasting (first thing in the morning, before eating) and 2-hours AFTER eating blood sugars at home-- this will help your doctors know if your medications are working or if they need adjusting: we will review these values each time we talk over the phone  Your blood sugar goal for first thing in the morning before eating is between 110-130 Your blood sugar goal for 2-hours after eating is between 160-180 Continue to monitor and write down on paper your morning weights every day: if you gain more than 3 lbs overnight, or 5 lbs in one week: please call your cardiology provider for instructions as to what you should do: you reported a weight this morning of 240 lbs: this is right in line with the previous weights you have been reporting Watch for swelling in feet, ankles and legs every day Wear the compression stockings that the cardiology staff recommended on days that you notice swelling in your feet/ ankles Continue to follow heart healthy, low salt, low cholesterol, carbohydrate-modified, low sugar diet Try to stay as active as possible, and please take effort to not rush-- rushing will increase your anxiety and may cause you to fall Keep up the great work preventing falls-- continue to use your cane  Generally, you should have these blood glucose levels: FASTING/ first thing in the morning: 80-130 mg/dL (4.4-7.2 mmol/L). 2-hours after meals (postprandial): below 180 mg/dL (10 mmol/L). Hemoglobin A1c (HbA1c) level: less than 7%.  Our  next scheduled telephone follow up visit/ appointment with care management team member is scheduled on:   Tuesday, March 21,2023 at 11:30 am- This is a PHONE CALL appointment  If you need to cancel or re-schedule our visit, please call [phone] and our care guide team will be happy to assist you.   I look forward to hearing about your progress.   [name], RN, BSN, [place] [phone]: direct office  If you are experiencing a Mental Health or [place] or need someone to talk to, please  call the Suicide and Crisis Lifeline: 988 call the [place] National Suicide Prevention Lifeline: 819-766-4040 or TTY: (816) 779-3621 TTY 660 543 7047) to talk to a trained counselor call 1-800-273-TALK (toll free, 24 hour hotline) go to [facility] [address] [place] [phone]) call 911   The patient verbalized understanding of instructions, educational materials, and care plan provided today and agreed to receive a mailed copy of patient instructions, educational materials, and care plan  Diabetes Mellitus Basics Diabetes mellitus, or diabetes, is a long-term (chronic) disease. It occurs when the body does not properly use sugar (glucose) that is released from food after you eat. Diabetes mellitus may be caused by one or both of these problems: Your pancreas does not make enough of a hormone called insulin. Your body does not react in a normal way to the insulin that it makes. Insulin lets glucose enter cells in your body. This gives you energy. If you have diabetes, glucose cannot get into cells. This causes high  blood glucose (hyperglycemia). How to treat and manage diabetes You may need to take insulin or other diabetes medicines daily to keep your glucose in balance. If you are prescribed insulin, you will learn how to give yourself insulin by injection. You may need  to adjust the amount of insulin you take based on the foods that you eat. You will need to check your blood glucose levels using a glucose monitor as told by your health care provider. The readings can help determine if you have low or high blood glucose. Generally, you should have these blood glucose levels: Before meals (preprandial): 80-130 mg/dL (4.4-7.2 mmol/L). After meals (postprandial): below 180 mg/dL (10 mmol/L). Hemoglobin A1c (HbA1c) level: less than 7%. Your health care provider will set treatment goals for you. Keep all follow-up visits. This is important. Follow these instructions at home: Diabetes medicines Take your diabetes medicines every day as told by your health care provider. List your diabetes medicines here: Name of medicine: ______________________________ Amount (dose): _______________ Time (a.m./p.m.): _______________ Notes: ___________________________________ Name of medicine: ______________________________ Amount (dose): _______________ Time (a.m./p.m.): _______________ Notes: ___________________________________ Name of medicine: ______________________________ Amount (dose): _______________ Time (a.m./p.m.): _______________ Notes: ___________________________________ Insulin If you use insulin, list the types of insulin you use here: Insulin type: ______________________________ Amount (dose): _______________ Time (a.m./p.m.): _______________Notes: ___________________________________ Insulin type: ______________________________ Amount (dose): _______________ Time (a.m./p.m.): _______________ Notes: ___________________________________ Insulin type: ______________________________ Amount (dose): _______________ Time (a.m./p.m.): _______________ Notes: ___________________________________ Insulin type: ______________________________ Amount (dose): _______________ Time (a.m./p.m.): _______________ Notes: ___________________________________ Insulin type:  ______________________________ Amount (dose): _______________ Time (a.m./p.m.): _______________ Notes: ___________________________________ Managing blood glucose Check your blood glucose levels using a glucose monitor as told by your health care provider. Write down the times that you check your glucose levels here: Time: _______________ Notes: ___________________________________ Time: _______________ Notes: ___________________________________ Time: _______________ Notes: ___________________________________ Time: _______________ Notes: ___________________________________ Time: _______________ Notes: ___________________________________ Time: _______________ Notes: ___________________________________  Low blood glucose Low blood glucose (hypoglycemia) is when glucose is at or below 70 mg/dL (3.9 mmol/L). Symptoms may include: Feeling: Hungry. Sweaty and clammy. Irritable or easily upset. Dizzy. Sleepy. Having: A fast heartbeat. A headache. A change in your vision. Numbness around the mouth, lips, or tongue. Having trouble with: Moving (coordination). Sleeping. Treating low blood glucose To treat low blood glucose, eat or drink something containing sugar right away. If you can think clearly and swallow safely, follow the 15:15 rule: Take 15 grams of a fast-acting carb (carbohydrate), as told by your health care provider. Some fast-acting carbs are: Glucose tablets: take 3-4 tablets. Hard candy: eat 3-5 pieces. Fruit juice: drink 4 oz (120 mL). Regular (not diet) soda: drink 4-6 oz (120-180 mL). Honey or sugar: eat 1 Tbsp (15 mL). Check your blood glucose levels 15 minutes after you take the carb. If your glucose is still at or below 70 mg/dL (3.9 mmol/L), take 15 grams of a carb again. If your glucose does not go above 70 mg/dL (3.9 mmol/L) after 3 tries, get help right away. After your glucose goes back to normal, eat a meal or a snack within 1 hour. Treating very low blood  glucose If your glucose is at or below 54 mg/dL (3 mmol/L), you have very low blood glucose (severe hypoglycemia). This is an emergency. Do not wait to see if the symptoms will go away. Get medical help right away. Call your local emergency services (911 in the [place] Do not drive yourself to the hospital. Questions to ask your health care provider Should I talk with a  diabetes educator? What equipment will I need to care for myself at home? What diabetes medicines do I need? When should I take them? How often do I need to check my blood glucose levels? What number can I call if I have questions? When is my follow-up visit? Where can I find a support group for people with diabetes? Where to find more information American Diabetes Association: www.diabetes.org Association of Diabetes Care and Education Specialists: www.diabeteseducator.org Contact a health care provider if: Your blood glucose is at or above 240 mg/dL (13.3 mmol/L) for 2 days in a row. You have been sick or have had a fever for 2 days or more, and you are not getting better. You have any of these problems for more than 6 hours: You cannot eat or drink. You feel nauseous. You vomit. You have diarrhea. Get help right away if: Your blood glucose is lower than 54 mg/dL (3 mmol/L). You get confused. You have trouble thinking clearly. You have trouble breathing. These symptoms may represent a serious problem that is an emergency. Do not wait to see if the symptoms will go away. Get medical help right away. Call your local emergency services (911 in the [place] Do not drive yourself to the hospital. Summary Diabetes mellitus is a chronic disease that occurs when the body does not properly use sugar (glucose) that is released from food after you eat. Take insulin and diabetes medicines as told. Check your blood glucose every day, as often as told. Keep all follow-up visits. This is important. This information is not intended to  replace advice given to you by your health care provider. Make sure you discuss any questions you have with your health care provider. Document Revised: 02/07/2020 Document Reviewed: 02/07/2020 Elsevier Patient Education  [place].

## 2021-11-27 NOTE — Progress Notes (Unsigned)
PCP:  Horald Pollen, MD Primary Cardiologist: None Electrophysiologist: [name], MD   [name] is a 81 y.o. female seen today for [name], MD for acute visit due to increase urination on torsemide and electrolyte check .  Since last being seen in our clinic the patient reports doing well with frequent urination. She called our office concerned about electrolyte levels, and it was uncovered that somewhere down the line she had stopped her K supp.  she denies chest pain, palpitations, dyspnea, PND, orthopnea, nausea, vomiting, dizziness, syncope, edema, weight gain, or early satiety.  Past Medical History:  Diagnosis Date   Arthritis    Back pain    Chronic anticoagulation    due to aflutter   Chronic kidney disease    Diabetes mellitus    Diastolic CHF, chronic ([place])    a.  echo 2006 - ef 55-65%; mild diast dysfxn;    b. Echo 08/2011: Mild LVH, EF 60%;  c. 04/2013 Echo: EF 65-69%, mild conc LVH;  08/2014 Echo: EF 60-65%, mild-mod MR.   Gout    Hyperlipidemia    Hypertension    a.  Renal arterial Dopplers 12/2011: 1-59% right renal artery stenosis   Morbid obesity ([place])    Obstructive sleep apnea on CPAP    Paroxysmal Afib/Flutter    a. 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   Past Surgical History:  Procedure Laterality Date   APPENDECTOMY     ATRIAL FLUTTER ABLATION N/A 09/24/2011   Procedure: ATRIAL FLUTTER ABLATION;  Surgeon: [name], MD;  Location: [facility] CATH LAB;  Service: Cardiovascular;  Laterality: N/A;   CARDIOVERSION  10/22/2011   Procedure: CARDIOVERSION;  Surgeon: [name], MD;  Location: [place];  Service: Cardiovascular;  Laterality: N/A;   CARDIOVERSION N/A 09/10/2011   Procedure: CARDIOVERSION;  Surgeon: [name], MD;  Location: [facility] CATH LAB;  Service: Cardiovascular;  Laterality: N/A;   CHOLECYSTECTOMY     COLONOSCOPY WITH PROPOFOL N/A 06/13/2021   Procedure: COLONOSCOPY WITH PROPOFOL;  Surgeon: Milus Banister, MD;  Location:  [facility] ENDOSCOPY;  Service: Endoscopy;  Laterality: N/A;   POLYPECTOMY  06/13/2021   Procedure: POLYPECTOMY;  Surgeon: Milus Banister, MD;  Location: [facility] ENDOSCOPY;  Service: Endoscopy;;   TONSILLECTOMY  1982   TOTAL ABDOMINAL HYSTERECTOMY      Current Outpatient Medications  Medication Sig Dispense Refill   Accu-Chek Softclix Lancets lancets 1 each by Other route 3 (three) times daily. as directed 100 each 3   allopurinol (ZYLOPRIM) 300 MG tablet TAKE 1 TABLET(300 MG) BY MOUTH DAILY 90 tablet 3   b complex vitamins capsule Take 1 capsule by mouth daily.     benzonatate (TESSALON) 100 MG capsule Take 1 capsule (100 mg total) by mouth 2 (two) times daily as needed for cough. 20 capsule 0   blood glucose meter kit and supplies Dispense based on patient and insurance preference. Use up to four times daily as directed. (FOR ICD-10 E10.9, E11.9). 1 each 0   BREZTRI AEROSPHERE 160-9-4.8 MCG/ACT AERO USE 2 INHALATIONS BY MOUTH IN  THE MORNING AND AT BEDTIME 21.4 g 5   cholecalciferol (VITAMIN D3) 25 MCG (1000 UNIT) tablet Take 1,000 Units by mouth daily.     cloNIDine (CATAPRES) 0.3 MG tablet Take 1 tablet (0.3 mg total) by mouth 2 (two) times daily. 180 tablet 3   Continuous Blood Gluc Receiver (FREESTYLE LIBRE 14 DAY READER) DEVI Use as directed. 1 each 0   Continuous Blood  Gluc Sensor (FREESTYLE LIBRE 14 DAY SENSOR) MISC Use as directed to check blood sugar daily 2 each 3   cyclobenzaprine (FLEXERIL) 5 MG tablet Take 1 tablet (5 mg total) by mouth as needed for muscle spasms. 30 tablet 0   diltiazem (CARDIZEM) 30 MG tablet Take 1 tablet (30 mg total) by mouth See admin instructions. Take one tablet every 4 hours as needed for heart rate greater than 100 45 tablet 2   dronedarone (MULTAQ) 400 MG tablet Take 400 mg by mouth 2 (two) times daily.     ELIQUIS 5 MG TABS tablet TAKE 1 TABLET(5 MG) BY MOUTH TWICE DAILY 180 tablet 1   HYDROcodone bit-homatropine (HYCODAN) 5-1.5 MG/5ML syrup Take 5 mLs by mouth  at bedtime as needed for cough. 120 mL 0   Insulin Lispro Prot & Lispro (HUMALOG MIX 75/25 KWIKPEN) (75-25) 100 UNIT/ML Kwikpen Inject 25 Units into the skin 2 (two) times daily. 45 mL 4   levothyroxine (SYNTHROID) 75 MCG tablet TAKE 1 TABLET BY MOUTH  DAILY BEFORE BREAKFAST 90 tablet 3   lidocaine (LIDODERM) 5 % Place 1 patch onto the skin daily. Remove & Discard patch within 12 hours or as directed by MD (Patient taking differently: Place 1 patch onto the skin daily as needed (Pain). Remove & Discard patch within 12 hours or as directed by MD) 30 patch 0   LINZESS 145 MCG CAPS capsule TAKE 1 CAPSULE BY MOUTH DAILY  BEFORE BREAKFAST 60 capsule 5   loperamide (IMODIUM) 2 MG capsule Take 1 capsule (2 mg total) by mouth 4 (four) times daily as needed for diarrhea or loose stools. 12 capsule 0   metoprolol tartrate (LOPRESSOR) 25 MG tablet TAKE 1 TABLET(25 MG) BY MOUTH TWICE DAILY 180 tablet 3   NOVOFINE PLUS PEN NEEDLE 32G X 4 MM MISC USE TWICE DAILY AS DIRECTED (Patient taking differently: 1 each by Other route as directed.) 100 each 11   ONETOUCH VERIO test strip 1 each by Other route 3 (three) times daily.     potassium chloride (KLOR-CON) 10 MEQ tablet Take 2 tablets (20 mEq total) by mouth daily. 180 tablet 3   RESTASIS MULTIDOSE 0.05 % ophthalmic emulsion Place 1 drop into both eyes 2 (two) times daily.      rosuvastatin (CRESTOR) 10 MG tablet TAKE 1 TABLET BY MOUTH IN  THE EVENING 90 tablet 3   Semaglutide, 1 MG/DOSE, (OZEMPIC, 1 MG/DOSE,) 4 MG/3ML SOPN Inject 1 mg into the skin once a week. (Patient taking differently: Inject 1 mg into the skin every Saturday.) 9 mL 3   torsemide (DEMADEX) 20 MG tablet Take 1 tablet (20 mg total) by mouth daily.     triamcinolone cream (KENALOG) 0.1 % Apply 1 application topically 2 (two) times daily. (Patient taking differently: Apply 1 application topically 2 (two) times daily as needed (dry skin).) 30 g 0   verapamil (CALAN) 120 MG tablet Take 1 tablet (120  mg total) by mouth in the morning. 180 tablet 3   vitamin B-12 (CYANOCOBALAMIN) 1000 MCG tablet Take 1,000 mcg by mouth daily.     No current facility-administered medications for this visit.    Allergies  Allergen Reactions   Doxycycline     Made patient feel very ill    Metformin Diarrhea    Social History   Socioeconomic History   Marital status: Married    Spouse name: Not on file   Number of children: 3   Years of education: Not on  file   Highest education level: Not on file  Occupational History   Occupation: DISABILITY/housewife    Employer: RETIRED  Tobacco Use   Smoking status: Never   Smokeless tobacco: Never  Vaping Use   Vaping Use: Never used  Substance and Sexual Activity   Alcohol use: No   Drug use: No   Sexual activity: Yes  Other Topics Concern   Not on file  Social History Narrative   Not on file   Social Determinants of Health   Financial Resource Strain: Low Risk    Difficulty of Paying Living Expenses: Not hard at all  Food Insecurity: No Food Insecurity   Worried About Charity fundraiser in the Last Year: Never true   [place] in the Last Year: Never true  Transportation Needs: No Transportation Needs   Lack of Transportation (Medical): No   Lack of Transportation (Non-Medical): No  Physical Activity: Inactive   Days of Exercise per Week: 0 days   Minutes of Exercise per Session: 0 min  Stress: No Stress Concern Present   Feeling of Stress : Not at all  Social Connections: Socially Integrated   Frequency of Communication with Friends and Family: More than three times a week   Frequency of Social Gatherings with Friends and Family: Never   Attends Religious Services: More than 4 times per year   Active Member of [employer] or Organizations: Yes   Attends Music therapist: More than 4 times per year   Marital Status: Married  Human resources officer Violence: Not At Risk   Fear of Current or Ex-Partner: No   Emotionally  Abused: No   Physically Abused: No   Sexually Abused: No     Review of Systems: All other systems reviewed and are otherwise negative except as noted above.  Physical Exam: There were no vitals filed for this visit.  GEN- The patient is well appearing, alert and oriented x 3 today.   HEENT: normocephalic, atraumatic; sclera clear, conjunctiva pink; hearing intact; oropharynx clear; neck supple, no JVP Lymph- no cervical lymphadenopathy Lungs- Clear to ausculation bilaterally, normal work of breathing.  No wheezes, rales, rhonchi Heart- Regular rate and rhythm, no murmurs, rubs or gallops, PMI not laterally displaced GI- soft, non-tender, non-distended, bowel sounds present, no hepatosplenomegaly Extremities- no clubbing, cyanosis, or edema; DP/PT/radial pulses 2+ bilaterally MS- no significant deformity or atrophy Skin- warm and dry, no rash or lesion Psych- euthymic mood, full affect Neuro- strength and sensation are intact  EKG is ordered. Personal review of EKG from today shows ***  Additional studies reviewed include: Previous EP office notes. ***  Assessment and Plan:  1.  Persistent atrial fibrillation EKG today shows ***  CMET/TSH stable 10/11/2021 Amiodarone stopped by Cardiology at [facility]. Now on Multaq 400 mg BID. We discussed that being on multaq and having AF for long periods can be potentially dangerous, and she should call for follow up right away if she notices she goes out of rhythm.  Continue metoprolol  Continue verapamil Continue prn diltiazem Continue Eliquis for CHADS2VASC of at least 3   2.  HTN Stable on current regimen    3.  Obesity There is no height or weight on file to calculate BMI.  Weight loss encouraged   4. Acute on chronic diastolic CHF Echo 06/6282 LVEf 65-70% Volume status *** on torsemide 20 mg daily For a time she was off of her K supp. BMET today   Follow up with  Dr. Caryl Comes in 6 months   Shirley Friar, PA-C   11/27/21 10:57 AM

## 2021-12-04 ENCOUNTER — Encounter: Payer: Self-pay | Admitting: Internal Medicine

## 2021-12-04 ENCOUNTER — Ambulatory Visit: Payer: Medicare Other | Admitting: Student

## 2021-12-04 DIAGNOSIS — I5032 Chronic diastolic (congestive) heart failure: Secondary | ICD-10-CM

## 2021-12-04 DIAGNOSIS — I10 Essential (primary) hypertension: Secondary | ICD-10-CM

## 2021-12-04 DIAGNOSIS — I4819 Other persistent atrial fibrillation: Secondary | ICD-10-CM

## 2021-12-04 NOTE — Assessment & Plan Note (Signed)
Obesity hypoventilation is more significant than reactive airways disease for her.

## 2021-12-04 NOTE — Assessment & Plan Note (Signed)
This is the most obvious medical problem.  She would likely need external program support to accomplish meaningful lifestyle change for diet and exercise.

## 2021-12-04 NOTE — Assessment & Plan Note (Signed)
We reviewed her original sleep study, compliance goals and medical goals. Plan-ordering again replacement of old machine auto 5- 15

## 2021-12-05 ENCOUNTER — Encounter: Payer: Self-pay | Admitting: Emergency Medicine

## 2021-12-05 NOTE — Progress Notes (Signed)
PCP:  Horald Pollen, MD Primary Cardiologist: None Electrophysiologist: [name], MD   [name] is a 81 y.o. female seen today for [name], MD for routine electrophysiology followup.  Since last being seen in our clinic the patient reports doing very well. UOP is better controlled. Drinking 48-64 oz daily. Watching sodium. She is SOB walking long distances, Otherwise OK with ADLs. She does feel like she is "still recuperating" from her time in the hospital.   Past Medical History:  Diagnosis Date   Arthritis    Back pain    Chronic anticoagulation    due to aflutter   Chronic kidney disease    Diabetes mellitus    Diastolic CHF, chronic ([place])    a.  echo 2006 - ef 55-65%; mild diast dysfxn;    b. Echo 08/2011: Mild LVH, EF 60%;  c. 04/2013 Echo: EF 65-69%, mild conc LVH;  08/2014 Echo: EF 60-65%, mild-mod MR.   Gout    Hyperlipidemia    Hypertension    a.  Renal arterial Dopplers 12/2011: 1-59% right renal artery stenosis   Morbid obesity ([place])    Obstructive sleep apnea on CPAP    Paroxysmal Afib/Flutter    a. 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   Past Surgical History:  Procedure Laterality Date   APPENDECTOMY     ATRIAL FLUTTER ABLATION N/A 09/24/2011   Procedure: ATRIAL FLUTTER ABLATION;  Surgeon: [name], MD;  Location: [facility] CATH LAB;  Service: Cardiovascular;  Laterality: N/A;   CARDIOVERSION  10/22/2011   Procedure: CARDIOVERSION;  Surgeon: [name], MD;  Location: [place];  Service: Cardiovascular;  Laterality: N/A;   CARDIOVERSION N/A 09/10/2011   Procedure: CARDIOVERSION;  Surgeon: [name], MD;  Location: [facility] CATH LAB;  Service: Cardiovascular;  Laterality: N/A;   CHOLECYSTECTOMY     COLONOSCOPY WITH PROPOFOL N/A 06/13/2021   Procedure: COLONOSCOPY WITH PROPOFOL;  Surgeon: Milus Banister, MD;  Location: [facility] ENDOSCOPY;  Service: Endoscopy;  Laterality: N/A;   POLYPECTOMY  06/13/2021   Procedure: POLYPECTOMY;  Surgeon: Milus Banister, MD;  Location: [facility];  Service: Endoscopy;;   TONSILLECTOMY  1982   TOTAL ABDOMINAL HYSTERECTOMY      Current Outpatient Medications  Medication Sig Dispense Refill   Accu-Chek Softclix Lancets lancets 1 each by Other route 3 (three) times daily. as directed 100 each 3   allopurinol (ZYLOPRIM) 300 MG tablet TAKE 1 TABLET(300 MG) BY MOUTH DAILY 90 tablet 3   b complex vitamins capsule Take 1 capsule by mouth daily.     benzonatate (TESSALON) 100 MG capsule Take 1 capsule (100 mg total) by mouth 2 (two) times daily as needed for cough. 20 capsule 0   blood glucose meter kit and supplies Dispense based on patient and insurance preference. Use up to four times daily as directed. (FOR ICD-10 E10.9, E11.9). 1 each 0   BREZTRI AEROSPHERE 160-9-4.8 MCG/ACT AERO USE 2 INHALATIONS BY MOUTH IN  THE MORNING AND AT BEDTIME 21.4 g 5   cholecalciferol (VITAMIN D3) 25 MCG (1000 UNIT) tablet Take 1,000 Units by mouth daily.     cloNIDine (CATAPRES) 0.3 MG tablet Take 1 tablet (0.3 mg total) by mouth 2 (two) times daily. 180 tablet 3   Continuous Blood Gluc Receiver (FREESTYLE LIBRE 14 DAY READER) DEVI Use as directed. 1 each 0   Continuous Blood Gluc Sensor (FREESTYLE LIBRE 14 DAY SENSOR) MISC Use as directed to check blood sugar daily 2 each  3   cyclobenzaprine (FLEXERIL) 5 MG tablet Take 1 tablet (5 mg total) by mouth as needed for muscle spasms. 30 tablet 0   diltiazem (CARDIZEM) 30 MG tablet Take 1 tablet (30 mg total) by mouth See admin instructions. Take one tablet every 4 hours as needed for heart rate greater than 100 45 tablet 2   dronedarone (MULTAQ) 400 MG tablet Take 400 mg by mouth 2 (two) times daily.     ELIQUIS 5 MG TABS tablet TAKE 1 TABLET(5 MG) BY MOUTH TWICE DAILY 180 tablet 1   HYDROcodone bit-homatropine (HYCODAN) 5-1.5 MG/5ML syrup Take 5 mLs by mouth at bedtime as needed for cough. 120 mL 0   Insulin Lispro Prot & Lispro (HUMALOG MIX 75/25 KWIKPEN) (75-25) 100 UNIT/ML  Kwikpen Inject 25 Units into the skin 2 (two) times daily. 45 mL 4   levothyroxine (SYNTHROID) 75 MCG tablet TAKE 1 TABLET BY MOUTH  DAILY BEFORE BREAKFAST 90 tablet 3   lidocaine (LIDODERM) 5 % Place 1 patch onto the skin daily. Remove & Discard patch within 12 hours or as directed by MD (Patient taking differently: Place 1 patch onto the skin daily as needed (Pain). Remove & Discard patch within 12 hours or as directed by MD) 30 patch 0   LINZESS 145 MCG CAPS capsule TAKE 1 CAPSULE BY MOUTH DAILY  BEFORE BREAKFAST 60 capsule 5   loperamide (IMODIUM) 2 MG capsule Take 1 capsule (2 mg total) by mouth 4 (four) times daily as needed for diarrhea or loose stools. 12 capsule 0   metoprolol tartrate (LOPRESSOR) 25 MG tablet TAKE 1 TABLET(25 MG) BY MOUTH TWICE DAILY 180 tablet 3   NOVOFINE PLUS PEN NEEDLE 32G X 4 MM MISC USE TWICE DAILY AS DIRECTED (Patient taking differently: 1 each by Other route as directed.) 100 each 11   ONETOUCH VERIO test strip 1 each by Other route 3 (three) times daily.     potassium chloride (KLOR-CON) 10 MEQ tablet Take 2 tablets (20 mEq total) by mouth daily. 180 tablet 3   RESTASIS MULTIDOSE 0.05 % ophthalmic emulsion Place 1 drop into both eyes 2 (two) times daily.      rosuvastatin (CRESTOR) 10 MG tablet TAKE 1 TABLET BY MOUTH IN  THE EVENING 90 tablet 3   Semaglutide, 1 MG/DOSE, (OZEMPIC, 1 MG/DOSE,) 4 MG/3ML SOPN Inject 1 mg into the skin once a week. (Patient taking differently: Inject 1 mg into the skin every Saturday.) 9 mL 3   torsemide (DEMADEX) 20 MG tablet Take 1 tablet (20 mg total) by mouth daily.     triamcinolone cream (KENALOG) 0.1 % Apply 1 application topically 2 (two) times daily. (Patient taking differently: Apply 1 application topically 2 (two) times daily as needed (dry skin).) 30 g 0   verapamil (CALAN) 120 MG tablet Take 1 tablet (120 mg total) by mouth in the morning. 180 tablet 3   vitamin B-12 (CYANOCOBALAMIN) 1000 MCG tablet Take 1,000 mcg by mouth  daily.     No current facility-administered medications for this visit.    Allergies  Allergen Reactions   Doxycycline     Made patient feel very ill    Metformin Diarrhea    Social History   Socioeconomic History   Marital status: Married    Spouse name: Not on file   Number of children: 3   Years of education: Not on file   Highest education level: Not on file  Occupational History   Occupation: DISABILITY/housewife  Employer: RETIRED  Tobacco Use   Smoking status: Never   Smokeless tobacco: Never  Vaping Use   Vaping Use: Never used  Substance and Sexual Activity   Alcohol use: No   Drug use: No   Sexual activity: Yes  Other Topics Concern   Not on file  Social History Narrative   Not on file   Social Determinants of Health   Financial Resource Strain: Low Risk    Difficulty of Paying Living Expenses: Not hard at all  Food Insecurity: No Food Insecurity   Worried About Charity fundraiser in the Last Year: Never true   [place] in the Last Year: Never true  Transportation Needs: No Transportation Needs   Lack of Transportation (Medical): No   Lack of Transportation (Non-Medical): No  Physical Activity: Inactive   Days of Exercise per Week: 0 days   Minutes of Exercise per Session: 0 min  Stress: No Stress Concern Present   Feeling of Stress : Not at all  Social Connections: Socially Integrated   Frequency of Communication with Friends and Family: More than three times a week   Frequency of Social Gatherings with Friends and Family: Never   Attends Religious Services: More than 4 times per year   Active Member of [employer] or Organizations: Yes   Attends Music therapist: More than 4 times per year   Marital Status: Married  Human resources officer Violence: Not At Risk   Fear of Current or Ex-Partner: No   Emotionally Abused: No   Physically Abused: No   Sexually Abused: No     Review of Systems: All other systems reviewed and are  otherwise negative except as noted above.  Physical Exam: Vitals:   12/06/21 0807  BP: (!) 152/62  Pulse: (!) 59  SpO2: 96%  Weight: 241 lb (109.3 kg)  Height: '5\' 1"'  (1.549 m)   Wt Readings from Last 3 Encounters:  12/06/21 241 lb (109.3 kg)  10/31/21 239 lb (108.4 kg)  10/30/21 241 lb (109.3 kg)     GEN- The patient is well appearing, alert and oriented x 3 today.   HEENT: normocephalic, atraumatic; sclera clear, conjunctiva pink; hearing intact; oropharynx clear; neck supple, no JVP Lymph- no cervical lymphadenopathy Lungs- Clear to ausculation bilaterally, normal work of breathing.  No wheezes, rales, rhonchi Heart- Regular rate and rhythm, no murmurs, rubs or gallops, PMI not laterally displaced GI- soft, non-tender, non-distended, bowel sounds present, no hepatosplenomegaly Extremities- no clubbing, cyanosis, or edema; DP/PT/radial pulses 2+ bilaterally MS- no significant deformity or atrophy Skin- warm and dry, no rash or lesion Psych- euthymic mood, full affect Neuro- strength and sensation are intact  EKG is ordered. Personal review of EKG from today shows sinus brady at 59 bpm, stable intervals, mild 1st degree AV block  Additional studies reviewed include: Previous EP office notes.   Assessment and Plan:  1.  Persistent atrial fibrillation EKG today shows sinus brady, 1st degree AVB CMET/TSH stable 10/11/2021 Amiodarone stopped by Cardiology at [facility]. Now on Multaq 400 mg BID. We discussed that being on multaq and having AF for long periods can be potentially dangerous, and she should call for follow up right away if she notices she goes out of rhythm.  Continue metoprolol  Continue verapamil Continue prn diltiazem Continue Eliquis for CHADS2VASC of at least 3   2.  HTN Stable on current regimen    3.  Obesity Body mass index is 45.54 kg/m.  Weight  loss encouraged   4. Chronic diastolic heart failure Continue torsemide Volume status stable on exam  today.   Follow up with Dr. Caryl Comes in 6 months   Shirley Friar, [place]  12/06/21 8:18 AM

## 2021-12-06 ENCOUNTER — Ambulatory Visit (INDEPENDENT_AMBULATORY_CARE_PROVIDER_SITE_OTHER): Payer: Medicare Other | Admitting: Student

## 2021-12-06 ENCOUNTER — Other Ambulatory Visit: Payer: Self-pay

## 2021-12-06 VITALS — BP 152/62 | HR 59 | Ht 61.0 in | Wt 241.0 lb

## 2021-12-06 DIAGNOSIS — I5032 Chronic diastolic (congestive) heart failure: Secondary | ICD-10-CM | POA: Diagnosis not present

## 2021-12-06 DIAGNOSIS — I10 Essential (primary) hypertension: Secondary | ICD-10-CM

## 2021-12-06 DIAGNOSIS — I4819 Other persistent atrial fibrillation: Secondary | ICD-10-CM | POA: Diagnosis not present

## 2021-12-06 DIAGNOSIS — D6869 Other thrombophilia: Secondary | ICD-10-CM

## 2021-12-06 LAB — CBC
Hematocrit: 33.6 % — ABNORMAL LOW (ref 34.0–46.6)
Hemoglobin: 10.9 g/dL — ABNORMAL LOW (ref 11.1–15.9)
MCH: 26.1 pg — ABNORMAL LOW (ref 26.6–33.0)
MCHC: 32.4 g/dL (ref 31.5–35.7)
MCV: 80 fL (ref 79–97)
Platelets: 363 10*3/uL (ref 150–450)
RBC: 4.18 x10E6/uL (ref 3.77–5.28)
RDW: 15.3 % (ref 11.7–15.4)
WBC: 12.5 10*3/uL — ABNORMAL HIGH (ref 3.4–10.8)

## 2021-12-06 LAB — BASIC METABOLIC PANEL
BUN/Creatinine Ratio: 24 (ref 12–28)
BUN: 17 mg/dL (ref 8–27)
CO2: 33 mmol/L — ABNORMAL HIGH (ref 20–29)
Calcium: 9.2 mg/dL (ref 8.7–10.3)
Chloride: 96 mmol/L (ref 96–106)
Creatinine, Ser: 0.71 mg/dL (ref 0.57–1.00)
Glucose: 150 mg/dL — ABNORMAL HIGH (ref 70–99)
Potassium: 3.5 mmol/L (ref 3.5–5.2)
Sodium: 140 mmol/L (ref 134–144)
eGFR: 87 mL/min/{1.73_m2} (ref >59)

## 2021-12-06 NOTE — Patient Instructions (Signed)
Medication Instructions:  Your physician recommends that you continue on your current medications as directed. Please refer to the Current Medication list given to you today.  *If you need a refill on your cardiac medications before your next appointment, please call your pharmacy*   Lab Work: TODAY: BMET, CBC  If you have labs (blood work) drawn today and your tests are completely normal, you will receive your results only by: [place] (if you have MyChart) OR A paper copy in the mail If you have any lab test that is abnormal or we need to change your treatment, we will call you to review the results.  Follow-Up: At [facility], you and your health needs are our priority.  As part of our continuing mission to provide you with exceptional heart care, we have created designated Provider Care Teams.  These Care Teams include your primary Cardiologist (physician) and Advanced Practice Providers (APPs -  Physician Assistants and Nurse Practitioners) who all work together to provide you with the care you need, when you need it.  We recommend signing up for the patient portal called "MyChart".  Sign up information is provided on this After Visit Summary.  MyChart is used to connect with patients for Virtual Visits (Telemedicine).  Patients are able to view lab/test results, encounter notes, upcoming appointments, etc.  Non-urgent messages can be sent to your provider as well.   To learn more about what you can do with MyChart, go to [email or web address].    Your next appointment:   6 month(s)  The format for your next appointment:   In Person  Provider:   You may see [name], MD or one of the following Advanced Practice Providers on your designated Care Team:   Tommye Standard, [place] "[facility]" [place], [place]

## 2021-12-07 ENCOUNTER — Encounter: Payer: Self-pay | Admitting: Internal Medicine

## 2021-12-10 ENCOUNTER — Other Ambulatory Visit: Payer: Self-pay

## 2021-12-10 DIAGNOSIS — E876 Hypokalemia: Secondary | ICD-10-CM

## 2021-12-10 MED ORDER — POTASSIUM CHLORIDE ER 10 MEQ PO TBCR: 40.0000 meq | EXTENDED_RELEASE_TABLET | Freq: Every day | ORAL | 0 refills | Status: DC

## 2021-12-11 ENCOUNTER — Ambulatory Visit (INDEPENDENT_AMBULATORY_CARE_PROVIDER_SITE_OTHER): Payer: Medicare Other | Admitting: Emergency Medicine

## 2021-12-11 ENCOUNTER — Encounter: Payer: Self-pay | Admitting: Emergency Medicine

## 2021-12-11 ENCOUNTER — Other Ambulatory Visit: Payer: Self-pay

## 2021-12-11 VITALS — BP 132/82 | HR 89 | Ht 61.0 in | Wt 241.0 lb

## 2021-12-11 DIAGNOSIS — S8991XA Unspecified injury of right lower leg, initial encounter: Secondary | ICD-10-CM | POA: Diagnosis not present

## 2021-12-11 DIAGNOSIS — Z7901 Long term (current) use of anticoagulants: Secondary | ICD-10-CM

## 2021-12-11 DIAGNOSIS — S8011XA Contusion of right lower leg, initial encounter: Secondary | ICD-10-CM | POA: Diagnosis not present

## 2021-12-11 DIAGNOSIS — I152 Hypertension secondary to endocrine disorders: Secondary | ICD-10-CM

## 2021-12-11 DIAGNOSIS — L989 Disorder of the skin and subcutaneous tissue, unspecified: Secondary | ICD-10-CM | POA: Diagnosis not present

## 2021-12-11 DIAGNOSIS — E1159 Type 2 diabetes mellitus with other circulatory complications: Secondary | ICD-10-CM

## 2021-12-11 DIAGNOSIS — I480 Paroxysmal atrial fibrillation: Secondary | ICD-10-CM

## 2021-12-11 MED ORDER — METOPROLOL TARTRATE 25 MG PO TABS: 25.0000 mg | ORAL_TABLET | Freq: Two times a day (BID) | ORAL | 3 refills | Status: DC

## 2021-12-11 NOTE — Progress Notes (Signed)
HPI F never smoker followed for OSA, Asthma/ COPD, complicated by dCHF, Aortic Atherosclerosis, HTN, PAFib/Amiodarone/Eliquis, Varicose Veins, Restrictive Lung Disease, GERD, Nonalcoholic Fatty Liver, Hypothyroid, DM2/ neuropathy, GOUT, Morbid Obesity,  NPSG 06/05/11-AHI 20.1/ hr, desaturation to  87%, body weight 261 lbs, CPAP 10 PFT 03/25/17- moderately severe obstruction, insignificant response to BD, Nl TLC, DLCO mildly reduced. ==============================================================================   09[birth year]21-79 yoF never smoker followed for OSA, Asthma/ COPD, Chronic Cough, complicated by dCHF, Aortic Atherosclerosis, HTN, PAFib/Amiodarone/Eliquis, Varicose Veins, Restrictive Lung Disease, GERD, Nonalcoholic Fatty Liver, Hypothyroid, DM2/ neuropathy, GOUT, Morbid Obesity,            -no bronchodilators -Prednisone 20 x 5 days, augmentin, Hycodan, amiodarone, Breztri CPAP auto 5-15/ [facility]    replaced 2017   replacement ordered 01/03/21 Download- Body weight today-241 lbs Covid vax-no  Flu vax-no LOV NP [place] 12/9- Chronic cough. CXR pulm edema> incr diuretic, CT chest>   BNP 226H,     RVP-   Nl flora, WBC 13K, ESR 85,                 Concerned possible amiodarone tox. Started augmentin and prednisone. -----Patient states that she is feeling much better since she saw Katie.  Script for Judithann Sauger was sent to [facility] so only had sample ow has her replacement machine.  We are requesting download. She can tell she is back in atrial fibrillation with cardiology appointment tomorrow. After antibiotic and prednisone restarted, her breathing has been much more comfortable.  She has a few days left of each. We reviewed recent labs including  BNP 226, elevated glucose on prednisone for which she has contacted her diabetes doctor CT scan findings including right lung nodule CT chest 09/27/21- IMPRESSION: 1. Scattered ground-glass opacities within the right lower lobe, favor inflammatory or  infectious change rather than asymmetric edema. 2. 10 mm suspicious right solid pulmonary nodule. Consider a non-contrast Chest CT at 3 months, a PET/CT, or tissue sampling. These guidelines do not apply to immunocompromised patients and patients with cancer. Follow up in patients with significant comorbidities as clinically warranted. For lung cancer screening, adhere to Lung-RADS guidelines. Reference: Radiology. 2017; 284(1):228-43. 3. Chronic scarring at the lung bases, right greater than left. 4.  Aortic Atherosclerosis (ICD10-I70.0).  12/13/21- 80 yoF never smoker followed for OSA, Asthma/ COPD, Chronic Cough, Lung Nodule, complicated by dCHF, Aortic Atherosclerosis, HTN, PAFib/Amiodarone/Eliquis, Varicose Veins, Restrictive Lung Disease, GERD, Nonalcoholic Fatty Liver, Hypothyroid, DM2/ neuropathy, GOUT, Morbid Obesity,            -no bronchodilators -  Hycodan, amiodarone, Breztri CPAP auto 5-20/ [place]/ [facility]    replaced 2017   replacement ordered 01/03/21   Download-compliance 29.3%, AHI 0.6/ hr.    Used 79% of days but averaging only 3 hours Body weight today-240 lbs Covid vax-no Flu vax-no Saw PCP 2/23 for hematoma RLL on Eliquis. Saw NP 1/12 post hosp [facility] for CHF and Pneumonia, AFib> cardioversion Has Super D Chest CT scheduled for 12/31/21 for f/u 71m R lung nodule noted on December CT.  Pressure Range usually 6-8 cwp -----Patient is doing good, no concerns She blames physical difficulty getting in and out of bed, and polyuria on diuretics for limited CPAP use.  She puts it on almost every night but cannot keep it on more than a few hours.  Download reviewed with her. Currently her breathing is comfortable.  Denies ankle edema. CXR 10/31/21- IMPRESSION: Cardiomegaly with mild central congestion. Possible tiny pleural effusions.  ROS-see HPI   + =  positive Constitutional:    weight loss, night sweats, fevers, chills, fatigue, lassitude. HEENT:    headaches,  difficulty swallowing, tooth/dental problems, sore throat,       sneezing, itching, ear ache, nasal congestion, post nasal drip, snoring CV:    chest pain, orthopnea, PND, swelling in lower extremities, anasarca,                                   dizziness, palpitations Resp:   +shortness of breath with exertion or at rest.                productive cough,   non-productive cough, coughing up of blood.              change in color of mucus.  wheezing.   Skin:    rash or lesions. GI:  No-   heartburn, indigestion, abdominal pain, nausea, vomiting, diarrhea,                 change in bowel habits, loss of appetite GU: dysuria, change in color of urine, +urgency or frequency.   flank pain. MS:   joint pain, stiffness, decreased range of motion, back pain. Neuro-     nothing unusual Psych:  change in mood or affect.  depression or anxiety.   memory loss.  OBJ- Physical Exam General- Alert, Oriented, Affect-appropriate, Distress- none acute, + morbidly obese, + wheelchair Skin- rash-none, lesions- none, excoriation- none Lymphadenopathy- none Head- atraumatic            Eyes- Gross vision intact, PERRLA, conjunctivae and secretions clear,             Ears- Hearing, canals-normal            Nose- Clear, no-Septal dev, mucus, polyps, erosion, perforation             Throat- Mallampati II-III , mucosa clear , drainage- none, tonsils- absent, + teeth Neck- flexible , trachea midline, no stridor , thyroid nl, carotid no bruit Chest - symmetrical excursion , unlabored           Heart/CV- RRR/ AFib , + murmur 1-2+S , no gallop  , no rub, nl s1 s2                           - JVD-none, edema- none, stasis changes- none, varices- none           Lung- clear to P&A, no rales or crackles, wheeze- none, cough- none , dullness-none, rub- none           Chest wall-  Abd-  Br/ Gen/ Rectal- Not done, not indicated Extrem- cyanosis- none, clubbing, none, atrophy- none, strength- nl Neuro- grossly intact to  observation

## 2021-12-11 NOTE — Patient Instructions (Signed)
Hematoma A hematoma is a collection of blood. A hematoma can happen: Under the skin. In an organ. In a body space. In a joint space. In other tissues. The blood can thicken (clot) to form a lump that you can see and feel. The lump is often hard and may become sore and tender. The lump can be very small or very big. Most hematomas get better in a few days to weeks. However, some hematomas may be serious andneed medical care. What are the causes? This condition is caused by: An injury. Blood that leaks under the skin. Problems from surgeries. Medical conditions that cause bleeding or bruising. What increases the risk? You are more likely to develop this condition if: You are an older adult. You use medicines that thin your blood. What are the signs or symptoms? Symptoms depend on where the hematoma is in your body. If the hematoma is under the skin, there is: A firm lump on the body. Pain and tenderness in the area. Bruising. The skin above the lump may be blue, dark blue, purple-red, or yellowish. If the hematoma is deep in the tissues or body spaces, there may be: Blood in the stomach. This may cause pain in the belly (abdomen), weakness, passing out (fainting), and shortness of breath. Blood in the head. This may cause a headache, weakness, trouble speaking or understanding speech, or passing out. How is this diagnosed? This condition is diagnosed based on: Your medical history. A physical exam. Imaging tests, such as ultrasound or CT scan. Blood tests. How is this treated? Treatment depends on the cause, size, and location of the hematoma. Treatment may include: Doing nothing. Many hematomas go away on their own without treatment. Surgery or close monitoring. This may be needed for large hematomas or hematomas that affect the body's organs. Medicines. These may be given if a medical condition caused the hematoma. Follow these instructions at home: Managing pain, stiffness,  and swelling  If told, put ice on the area. Put ice in a plastic bag. Place a towel between your skin and the bag. Leave the ice on for 20 minutes, 2-3 times a day for the first two days. If told, put heat on the affected area after putting ice on the area for two days. Use the heat source that your doctor tells you to use. This could be a moist heat pack or a heating pad. To do this: Place a towel between your skin and the heat source. Leave the heat on for 20-30 minutes. Remove the heat if your skin turns bright red. This is very important if you are unable to feel pain, heat, or cold. You may have a greater risk of getting burned. Raise (elevate) the affected area above the level of your heart while you are sitting or lying down. Wrap the affected area with an elastic bandage, if told by your doctor. Do not wrap the bandage too tightly. If your hematoma is on a leg or foot and is painful, your doctor may give you crutches. Use them as told by your doctor.  General instructions Take over-the-counter and prescription medicines only as told by your doctor. Keep all follow-up visits as told by your doctor. This is important. Contact a doctor if: You have a fever. The swelling or bruising gets worse. You start to get more hematomas. Get help right away if: Your pain gets worse. Your pain is not getting better with medicine. Your skin over the hematoma breaks or starts to bleed.   Your hematoma is in your chest or belly and you: Pass out. Feel weak. Become short of breath. You have a hematoma on your scalp that is caused by a fall or injury, and you: Have a headache that gets worse. Have trouble speaking or understanding speech. Become less alert or you pass out. Summary A hematoma is a collection of blood in any part of your body. Most hematomas get better on their own in a few days to weeks. Some may need medical care. Follow instructions from your doctor about how to care for your  hematoma. Contact a doctor if the swelling or bruising gets worse, or if you are short of breath. This information is not intended to replace advice given to you by your health care provider. Make sure you discuss any questions you have with your healthcare provider. Document Revised: 03/11/2018 Document Reviewed: 03/11/2018 Elsevier Patient Education  2022 [employer]  

## 2021-12-11 NOTE — Progress Notes (Signed)
[name] 81 y.o.   Chief Complaint  Patient presents with   Leg Pain    Right leg, x1 week    HISTORY OF PRESENT ILLNESS: Acute problem visit today. This is a 81 y.o. female complaining of right lower leg injury sustained last week.  Heavy object fell on her leg. Patient presently on Eliquis.  Developed hematoma. No other injuries.  No other complaints or medical concerns today. Requesting refill on metoprolol tartrate.  Leg Pain     Prior to Admission medications   Medication Sig Start Date End Date Taking? Authorizing Provider  Accu-Chek Softclix Lancets lancets 1 each by Other route 3 (three) times daily. as directed 03/30/20  Yes Maximiano Coss, NP  allopurinol (ZYLOPRIM) 300 MG tablet TAKE 1 TABLET(300 MG) BY MOUTH DAILY 10/18/21  Yes Khloie Hamada, Ines Bloomer, MD  b complex vitamins capsule Take 1 capsule by mouth daily.   Yes [provider]  blood glucose meter kit and supplies Dispense based on patient and insurance preference. Use up to four times daily as directed. (FOR ICD-10 E10.9, E11.9). 08/29/21  Yes Moorea Boissonneault, Ines Bloomer, MD  BREZTRI AEROSPHERE 160-9-4.8 MCG/ACT AERO USE 2 INHALATIONS BY MOUTH IN  THE MORNING AND AT BEDTIME 11/12/21  Yes Cobb, Karie Schwalbe, NP  cholecalciferol (VITAMIN D3) 25 MCG (1000 UNIT) tablet Take 1,000 Units by mouth daily.   Yes [provider]  cloNIDine (CATAPRES) 0.3 MG tablet Take 1 tablet (0.3 mg total) by mouth 2 (two) times daily. 07/02/21  Yes Juleah Paradise, Ines Bloomer, MD  Continuous Blood Gluc Receiver (FREESTYLE LIBRE 14 DAY READER) DEVI Use as directed. 08/29/21  Yes Edgel Degnan, Ines Bloomer, MD  Continuous Blood Gluc Sensor (FREESTYLE LIBRE 14 DAY SENSOR) MISC Use as directed to check blood sugar daily 08/29/21  Yes Florice Hindle, Ines Bloomer, MD  cyclobenzaprine (FLEXERIL) 5 MG tablet Take 1 tablet (5 mg total) by mouth as needed for muscle spasms. 06/18/21  Yes Huber Mathers, Ines Bloomer, MD  diltiazem (CARDIZEM) 30 MG tablet  Take 1 tablet (30 mg total) by mouth See admin instructions. Take one tablet every 4 hours as needed for heart rate greater than 100 08/26/21  Yes Sherran Needs, NP  dronedarone (MULTAQ) 400 MG tablet Take 400 mg by mouth 2 (two) times daily. 10/20/21  Yes [provider]  ELIQUIS 5 MG TABS tablet TAKE 1 TABLET(5 MG) BY MOUTH TWICE DAILY 06/17/21  Yes [name], MD  HYDROcodone bit-homatropine (HYCODAN) 5-1.5 MG/5ML syrup Take 5 mLs by mouth at bedtime as needed for cough. 09/18/21  Yes Seleena Reimers, Ines Bloomer, MD  Insulin Lispro Prot & Lispro (HUMALOG MIX 75/25 KWIKPEN) (75-25) 100 UNIT/ML Kwikpen Inject 25 Units into the skin 2 (two) times daily. 10/04/21  Yes [name], Melanie Crazier, MD  levothyroxine (SYNTHROID) 75 MCG tablet TAKE 1 TABLET BY MOUTH  DAILY BEFORE BREAKFAST 10/24/21  Yes Sherilyn Windhorst, Ines Bloomer, MD  lidocaine (LIDODERM) 5 % Place 1 patch onto the skin daily. Remove & Discard patch within 12 hours or as directed by MD Patient taking differently: Place 1 patch onto the skin daily as needed (Pain). Remove & Discard patch within 12 hours or as directed by MD 08/29/20  Yes Mcarthur Rossetti, MD  LINZESS 145 MCG CAPS capsule TAKE 1 CAPSULE BY MOUTH DAILY  BEFORE BREAKFAST 10/30/21  Yes Solace Manwarren, Ines Bloomer, MD  loperamide (IMODIUM) 2 MG capsule Take 1 capsule (2 mg total) by mouth 4 (four) times daily as needed for diarrhea or loose stools. 03/20/20  Yes Garald Balding, PA-C  metoprolol tartrate (LOPRESSOR) 25 MG tablet TAKE 1 TABLET(25 MG) BY MOUTH TWICE DAILY 11/12/21  Yes Dalicia Kisner, Ines Bloomer, MD  NOVOFINE PLUS PEN NEEDLE 32G X 4 MM MISC USE TWICE DAILY AS DIRECTED Patient taking differently: 1 each by Other route as directed. 04/08/21  Yes Renley Banwart, Ines Bloomer, MD  [facility] VERIO test strip 1 each by Other route 3 (three) times daily. 06/08/20  Yes [provider]  potassium chloride (KLOR-CON) 10 MEQ tablet Take 4 tablets (40 mEq total) by mouth daily. 12/10/21  01/09/22 Yes Tillery, Satira Mccallum, PA-C  RESTASIS MULTIDOSE 0.05 % ophthalmic emulsion Place 1 drop into both eyes 2 (two) times daily.  03/04/20  Yes [provider]  rosuvastatin (CRESTOR) 10 MG tablet TAKE 1 TABLET BY MOUTH IN  THE EVENING 04/25/21  Yes Kaybree Williams, [place], MD  Semaglutide, 1 MG/DOSE, (OZEMPIC, 1 MG/DOSE,) 4 MG/3ML SOPN Inject 1 mg into the skin once a week. Patient taking differently: Inject 1 mg into the skin every Saturday. 05/24/21  Yes [name], Melanie Crazier, MD  torsemide (DEMADEX) 20 MG tablet Take 1 tablet (20 mg total) by mouth daily. 05/30/21  Yes Sherran Needs, NP  triamcinolone cream (KENALOG) 0.1 % Apply 1 application topically 2 (two) times daily. Patient taking differently: Apply 1 application topically 2 (two) times daily as needed (dry skin). 07/26/20  Yes Alexa Blish, Ines Bloomer, MD  verapamil (CALAN) 120 MG tablet Take 1 tablet (120 mg total) by mouth in the morning. 08/16/21  Yes [name], MD  vitamin B-12 (CYANOCOBALAMIN) 1000 MCG tablet Take 1,000 mcg by mouth daily.   Yes [provider]  benzonatate (TESSALON) 100 MG capsule Take 1 capsule (100 mg total) by mouth 2 (two) times daily as needed for cough. Patient not taking: Reported on 12/11/2021 10/23/21   Horald Pollen, MD    Allergies  Allergen Reactions   Doxycycline     Made patient feel very ill    Metformin Diarrhea    Patient Active Problem List   Diagnosis Date Noted   Asthma 11/01/2021   Normocytic anemia 10/11/2021   Lung nodule 09/30/2021   Pneumonia 07/17/2021   Dysthymia 03/12/2021   Dysuria 03/01/2021   Primary osteoarthritis involving multiple joints 03/01/2021   Recurrent falls 01/10/2021   Sensorineural hearing loss (SNHL), 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   Persistent atrial fibrillation ([place]) 08/12/2020   Type 2 diabetes mellitus with hyperglycemia, with long-term current use of insulin ([place]) 06/21/2020   Type 2 diabetes mellitus with  diabetic polyneuropathy, with long-term current use of insulin ([place]) 06/21/2020   Mixed hyperlipidemia 06/21/2020   Uncontrolled hypertension 06/11/2020   Aortic atherosclerosis ([place]) 03/28/2020   Diabetic peripheral neuropathy ([place]) 12/26/2019   Gastroesophageal reflux disease without esophagitis 03/17/2019   Bilateral leg edema 02/16/2019   Varicose veins of both legs with edema 02/16/2019   Heart murmur 09/05/2018   Restrictive lung disease 02/22/2018   Dysgeusia 06/11/2017   Osteoarthritis of left knee 06/03/2017   NAFLD (nonalcoholic fatty liver disease) 08/07/2016   Osteoporosis 12/31/2015   Obesity 12/19/2015   Edema 10/18/2015   Acquired hypothyroidism 10/18/2015   Prolonged QT interval 10/03/2013   Long term (current) use of anticoagulants 06/09/2012   BENIGN NEOPLASM OF ADRENAL GLAND 11/25/2010   Chronic diastolic heart failure ([place]) 02/20/2010   DM 05/16/2009   GOUT 05/16/2009   OBESITY, MORBID 05/16/2009   Hypertension associated with diabetes ([place]) 05/16/2009   Paroxysmal atrial fibrillation ([place]) 05/16/2009  HYPERLIPIDEMIA 11/30/2008   Obstructive sleep apnea on CPAP 11/30/2008    Past Medical History:  Diagnosis Date   Arthritis    Back pain    Chronic anticoagulation    due to aflutter   Chronic kidney disease    Diabetes mellitus    Diastolic CHF, chronic ([place])    a.  echo 2006 - ef 55-65%; mild diast dysfxn;    b. Echo 08/2011: Mild LVH, EF 60%;  c. 04/2013 Echo: EF 65-69%, mild conc LVH;  08/2014 Echo: EF 60-65%, mild-mod MR.   Gout    Hyperlipidemia    Hypertension    a.  Renal arterial Dopplers 12/2011: 1-59% right renal artery stenosis   Morbid obesity ([place])    Obstructive sleep apnea on CPAP    Paroxysmal Afib/Flutter    a. 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    Past Surgical History:  Procedure Laterality Date   APPENDECTOMY     ATRIAL FLUTTER ABLATION N/A 09/24/2011   Procedure: ATRIAL FLUTTER  ABLATION;  Surgeon: [name], MD;  Location: [facility] CATH LAB;  Service: Cardiovascular;  Laterality: N/A;   CARDIOVERSION  10/22/2011   Procedure: CARDIOVERSION;  Surgeon: [name], MD;  Location: [facility];  Service: Cardiovascular;  Laterality: N/A;   CARDIOVERSION N/A 09/10/2011   Procedure: CARDIOVERSION;  Surgeon: [name], MD;  Location: [facility] CATH LAB;  Service: Cardiovascular;  Laterality: N/A;   CHOLECYSTECTOMY     COLONOSCOPY WITH PROPOFOL N/A 06/13/2021   Procedure: COLONOSCOPY WITH PROPOFOL;  Surgeon: Milus Banister, MD;  Location: [facility] ENDOSCOPY;  Service: Endoscopy;  Laterality: N/A;   POLYPECTOMY  06/13/2021   Procedure: POLYPECTOMY;  Surgeon: Milus Banister, MD;  Location: [facility] ENDOSCOPY;  Service: Endoscopy;;   TONSILLECTOMY  1982   TOTAL ABDOMINAL HYSTERECTOMY      Social History   Socioeconomic History   Marital status: Married    Spouse name: Not on file   Number of children: 3   Years of education: Not on file   Highest education level: Not on file  Occupational History   Occupation: DISABILITY/housewife    Employer: RETIRED  Tobacco Use   Smoking status: Never   Smokeless tobacco: Never  Vaping Use   Vaping Use: Never used  Substance and Sexual Activity   Alcohol use: No   Drug use: No   Sexual activity: Yes  Other Topics Concern   Not on file  Social History Narrative   Not on file   Social Determinants of Health   Financial Resource Strain: Low Risk    Difficulty of Paying Living Expenses: Not hard at all  Food Insecurity: No Food Insecurity   Worried About Charity fundraiser in the Last Year: Never true   [place] in the Last Year: Never true  Transportation Needs: No Transportation Needs   Lack of Transportation (Medical): No   Lack of Transportation (Non-Medical): No  Physical Activity: Inactive   Days of Exercise per Week: 0 days   Minutes of Exercise per Session: 0 min  Stress: No Stress Concern  Present   Feeling of Stress : Not at all  Social Connections: Socially Integrated   Frequency of Communication with Friends and Family: More than three times a week   Frequency of Social Gatherings with Friends and Family: Never   Attends Religious Services: More than 4 times per year   Active Member of [employer] or Organizations: Yes   Attends Archivist Meetings:  More than 4 times per year   Marital Status: Married  Human resources officer Violence: Not At Risk   Fear of Current or Ex-Partner: No   Emotionally Abused: No   Physically Abused: No   Sexually Abused: No    Family History  Problem Relation Age of Onset   Heart disease Father    Hypertension Father    Breast cancer Sister    Cancer Sister        breast   Colon cancer Neg Hx    Esophageal cancer Neg Hx    Pancreatic cancer Neg Hx    Liver disease Neg Hx      Review of Systems  Constitutional: Negative.  Negative for chills and fever.  HENT: Negative.  Negative for congestion and sore throat.   Respiratory: Negative.  Negative for cough and shortness of breath.   Cardiovascular: Negative.  Negative for chest pain and palpitations.  Gastrointestinal:  Negative for abdominal pain, diarrhea, nausea and vomiting.  Genitourinary: Negative.  Negative for dysuria and hematuria.  Skin: Negative.  Negative for rash.       Hyperpigmented facial lesion  Neurological: Negative.  Negative for dizziness and headaches.  All other systems reviewed and are negative.   Physical Exam Vitals reviewed.  Constitutional:      Appearance: She is obese.  HENT:     Head: Normocephalic.  Eyes:     Extraocular Movements: Extraocular movements intact.  Cardiovascular:     Rate and Rhythm: Normal rate and regular rhythm.     Heart sounds: Normal heart sounds.  Pulmonary:     Effort: Pulmonary effort is normal.     Breath sounds: Normal breath sounds.  Musculoskeletal:     Comments: Right calf area: Tender  hematoma.  No infection.  No open wounds.  Skin:    General: Skin is warm and dry.  Neurological:     General: No focal deficit present.     Mental Status: She is alert and oriented to person, place, and time.  Psychiatric:        Mood and Affect: Mood normal.        Behavior: Behavior normal.     ASSESSMENT & PLAN: Clinically stable.  On long-term anticoagulation with Eliquis.  Benign hematoma on right calf. Continue present medications.  No changes.  No red flag signs or symptoms. A total of 30 minutes was spent with the patient and counseling/coordination of care regarding preparing for this visit, review of most recent office visit notes, review of all medications, diagnosis and management of hematomas, prognosis, documentation and need for follow-up.  Problem List Items Addressed This Visit       Cardiovascular and Mediastinum   Hypertension associated with diabetes ([place])   Relevant Medications   metoprolol tartrate (LOPRESSOR) 25 MG tablet   Paroxysmal atrial fibrillation ([facility])   Relevant Medications   metoprolol tartrate (LOPRESSOR) 25 MG tablet     Other   Long term (current) use of anticoagulants   Other Visit Diagnoses     Hematoma of right lower extremity, initial encounter    -  Primary   Facial lesion       Relevant Orders   Ambulatory referral to Dermatology   Leg injury, right, initial encounter          Patient Instructions  Hematoma A hematoma is a collection of blood. A hematoma can happen: Under the skin. In an organ. In a body space. In a joint space. In other  tissues. The blood can thicken (clot) to form a lump that you can see and feel. The lump is often hard and may become sore and tender. The lump can be very small or very big. Most hematomas get better in a few days to weeks. However, some hematomas may be serious and need medical care. What are the causes? This condition is caused by: An injury. Blood that leaks under the skin. Problems  from surgeries. Medical conditions that cause bleeding or bruising. What increases the risk? You are more likely to develop this condition if: You are an older adult. You use medicines that thin your blood. What are the signs or symptoms? Symptoms depend on where the hematoma is in your body. If the hematoma is under the skin, there is: A firm lump on the body. Pain and tenderness in the area. Bruising. The skin above the lump may be blue, dark blue, purple-red, or yellowish. If the hematoma is deep in the tissues or body spaces, there may be: Blood in the stomach. This may cause pain in the belly (abdomen), weakness, passing out (fainting), and shortness of breath. Blood in the head. This may cause a headache, weakness, trouble speaking or understanding speech, or passing out. How is this diagnosed? This condition is diagnosed based on: Your medical history. A physical exam. Imaging tests, such as ultrasound or CT scan. Blood tests. How is this treated? Treatment depends on the cause, size, and location of the hematoma. Treatment may include: Doing nothing. Many hematomas go away on their own without treatment. Surgery or close monitoring. This may be needed for large hematomas or hematomas that affect the body's organs. Medicines. These may be given if a medical condition caused the hematoma. Follow these instructions at home: Managing pain, stiffness, and swelling  If told, put ice on the area. Put ice in a plastic bag. Place a towel between your skin and the bag. Leave the ice on for 20 minutes, 2-3 times a day for the first two days. If told, put heat on the affected area after putting ice on the area for two days. Use the heat source that your doctor tells you to use. This could be a moist heat pack or a heating pad. To do this: Place a towel between your skin and the heat source. Leave the heat on for 20-30 minutes. Remove the heat if your skin turns bright red. This is  very important if you are unable to feel pain, heat, or cold. You may have a greater risk of getting burned. Raise (elevate) the affected area above the level of your heart while you are sitting or lying down. Wrap the affected area with an elastic bandage, if told by your doctor. Do not wrap the bandage too tightly. If your hematoma is on a leg or foot and is painful, your doctor may give you crutches. Use them as told by your doctor. General instructions Take over-the-counter and prescription medicines only as told by your doctor. Keep all follow-up visits as told by your doctor. This is important. Contact a doctor if: You have a fever. The swelling or bruising gets worse. You start to get more hematomas. Get help right away if: Your pain gets worse. Your pain is not getting better with medicine. Your skin over the hematoma breaks or starts to bleed. Your hematoma is in your chest or belly and you: Pass out. Feel weak. Become short of breath. You have a hematoma on your scalp that is  caused by a fall or injury, and you: Have a headache that gets worse. Have trouble speaking or understanding speech. Become less alert or you pass out. Summary A hematoma is a collection of blood in any part of your body. Most hematomas get better on their own in a few days to weeks. Some may need medical care. Follow instructions from your doctor about how to care for your hematoma. Contact a doctor if the swelling or bruising gets worse, or if you are short of breath. This information is not intended to replace advice given to you by your health care provider. Make sure you discuss any questions you have with your health care provider. Document Revised: 03/11/2018 Document Reviewed: 03/11/2018 Elsevier Patient Education  2022 [place], MD [facility]

## 2021-12-13 ENCOUNTER — Ambulatory Visit (INDEPENDENT_AMBULATORY_CARE_PROVIDER_SITE_OTHER): Payer: Medicare Other | Admitting: Internal Medicine

## 2021-12-13 ENCOUNTER — Encounter: Payer: Self-pay | Admitting: Internal Medicine

## 2021-12-13 ENCOUNTER — Other Ambulatory Visit: Payer: Self-pay

## 2021-12-13 DIAGNOSIS — G4733 Obstructive sleep apnea (adult) (pediatric): Secondary | ICD-10-CM

## 2021-12-13 DIAGNOSIS — J4530 Mild persistent asthma, uncomplicated: Secondary | ICD-10-CM

## 2021-12-13 DIAGNOSIS — R911 Solitary pulmonary nodule: Secondary | ICD-10-CM | POA: Diagnosis not present

## 2021-12-13 DIAGNOSIS — Z9989 Dependence on other enabling machines and devices: Secondary | ICD-10-CM

## 2021-12-13 NOTE — Assessment & Plan Note (Signed)
Never smoker.  Pending scheduled super D chest CT in March and she will see [place] for follow-up shortly thereafter.

## 2021-12-13 NOTE — Assessment & Plan Note (Signed)
She is needing to work on ways to manage at night so she can use CPAP more consistently.  This was discussed. Plan-reduce CPAP range to auto 4-12

## 2021-12-13 NOTE — Assessment & Plan Note (Signed)
Moderate persistent uncomplicated Significant symptom overlap between CHF and asthma.  Currently she is clear.

## 2021-12-13 NOTE — Assessment & Plan Note (Addendum)
Most weight change is related to fluid status. Mobility is limited and exercise-related weight loss is unlikely.

## 2021-12-13 NOTE — Patient Instructions (Addendum)
Order- [facility]ME [facility]- please change autopap range to 4-12, continue mask of choice, humidifier, supplies, AirView/ card  [name]- please try to use your CPAP at least 4 hours every night. Let us know if it is uncomfortable.   Keep your appointment in March for the CT scan to follow up on the little nodule (spot) in your lung. We will keep an eye on it. We will discuss it when we see you again March 16.  Please call if we can help

## 2021-12-17 ENCOUNTER — Telehealth: Payer: Self-pay | Admitting: Internal Medicine

## 2021-12-17 ENCOUNTER — Ambulatory Visit: Payer: Medicare Other

## 2021-12-17 DIAGNOSIS — I5032 Chronic diastolic (congestive) heart failure: Secondary | ICD-10-CM

## 2021-12-17 DIAGNOSIS — I152 Hypertension secondary to endocrine disorders: Secondary | ICD-10-CM

## 2021-12-17 DIAGNOSIS — E1165 Type 2 diabetes mellitus with hyperglycemia: Secondary | ICD-10-CM | POA: Diagnosis not present

## 2021-12-17 DIAGNOSIS — I480 Paroxysmal atrial fibrillation: Secondary | ICD-10-CM

## 2021-12-17 DIAGNOSIS — Z7901 Long term (current) use of anticoagulants: Secondary | ICD-10-CM

## 2021-12-17 DIAGNOSIS — E1159 Type 2 diabetes mellitus with other circulatory complications: Secondary | ICD-10-CM

## 2021-12-17 DIAGNOSIS — Z794 Long term (current) use of insulin: Secondary | ICD-10-CM | POA: Diagnosis not present

## 2021-12-17 MED ORDER — IPRATROPIUM BROMIDE HFA 17 MCG/ACT IN AERS: 2.0000 | INHALATION_SPRAY | Freq: Four times a day (QID) | RESPIRATORY_TRACT | 2 refills | Status: DC | PRN

## 2021-12-17 NOTE — Telephone Encounter (Signed)
Patient is aware of recommendations and voiced her understanding.  Atrovent sent to preferred pharmacy.  Nothing further needed at this time.

## 2021-12-17 NOTE — Telephone Encounter (Signed)
Albuterol hfa  18 g   Refill x 12     inhale 2 puffs every 6 hours if needed for wheeze, shortness of breath

## 2021-12-17 NOTE — Telephone Encounter (Signed)
Patient is currently taking Breztri BID.   Just to clarify, would you like to D/C breztri and start spiriva?

## 2021-12-17 NOTE — Telephone Encounter (Signed)
Patient is aware of below recommendations and voiced her understanding.  Received hard stop when ordering due to interacting between Multaq and albuterol.  Dr. Annamaria Boots, please advise. Thanks

## 2021-12-17 NOTE — Patient Instructions (Signed)
Visit Information  Following are the goals we discussed today:   Set My Target A1c   Timeframe:  Long-Range Goal Priority:  High Start Date:  05/20/2021                           Expected End Date:  12/19/2022                   Follow Up Date 03/16/2022   - set target A1C goal <7.0%    Why is this important?   Your target A1C is decided together by you and your doctor.  It is based on several things like your age and other health issues.  A1c control prevents damage to eyes, heart, kidneys, nerves, and immune system when well controlled  Track and Manage Blood Pressure and Heart Rate / Symptoms of Heart Failure  Timeframe:  Long-Range Goal Priority:  High Start Date:  05/20/2021                           Expected End Date:  12/19/2022                   Follow Up Date 03/16/2022   - develop a rescue plan - eat more whole grains, fruits and vegetables, lean meats and healthy fats - follow rescue plan if symptoms flare-up - know when to call the doctor - track symptoms and what helps feel better or worse - dress right for the weather, hot or cold    Why is this important?   You will be able to handle your symptoms better if you keep track of them.  Making some simple changes to your lifestyle will help.  Eating healthy is one thing you can do to take good care of yourself.    Plan: Telephone follow up appointment with care management team member scheduled for:  3 months The patient has been provided with contact information for the care management team and has been advised to call with any health related questions or concerns.   [name], PharmD Clinical Pharmacist, [name]   Please call the care guide team at [phone] if you need to cancel or reschedule your appointment.   The patient verbalized understanding of instructions, educational materials, and care plan provided today and declined offer to receive copy of patient instructions, educational  materials, and care plan.

## 2021-12-17 NOTE — Telephone Encounter (Signed)
Pt states that she was speaking with pharmacy and they reccomended she ask for an albuterol inhaler. She is travelling to [place] on Thursday and would like an inhaler that works quickly rather than over time like her breztri. Pharmacy is [facility] in [place].

## 2021-12-17 NOTE — Telephone Encounter (Signed)
DC albuterol due to drug interaction with Multaq. Suggest instead Rx for Spiriva 1.25    inhale 2 puffs daily, ref x 1

## 2021-12-17 NOTE — Telephone Encounter (Signed)
Spoke to patient. She is requesting a Rx for albuterol. She has an upcoming trip planned and feels that it would be beneficial to have this on hand for SOB. She is currently taking Breztri BID.   Dr. Annamaria Boots, please advise.  Current Outpatient Medications on File Prior to Visit  Medication Sig Dispense Refill   Accu-Chek Softclix Lancets lancets 1 each by Other route 3 (three) times daily. as directed 100 each 3   allopurinol (ZYLOPRIM) 300 MG tablet TAKE 1 TABLET(300 MG) BY MOUTH DAILY 90 tablet 3   b complex vitamins capsule Take 1 capsule by mouth daily.     blood glucose meter kit and supplies Dispense based on patient and insurance preference. Use up to four times daily as directed. (FOR ICD-10 E10.9, E11.9). 1 each 0   BREZTRI AEROSPHERE 160-9-4.8 MCG/ACT AERO USE 2 INHALATIONS BY MOUTH IN  THE MORNING AND AT BEDTIME (Patient not taking: Reported on 12/17/2021) 21.4 g 5   cholecalciferol (VITAMIN D3) 25 MCG (1000 UNIT) tablet Take 1,000 Units by mouth daily.     cloNIDine (CATAPRES) 0.3 MG tablet Take 1 tablet (0.3 mg total) by mouth 2 (two) times daily. 180 tablet 3   Continuous Blood Gluc Receiver (FREESTYLE LIBRE 14 DAY READER) DEVI Use as directed. 1 each 0   Continuous Blood Gluc Sensor (FREESTYLE LIBRE 14 DAY SENSOR) MISC Use as directed to check blood sugar daily 2 each 3   diltiazem (CARDIZEM) 30 MG tablet Take 1 tablet (30 mg total) by mouth See admin instructions. Take one tablet every 4 hours as needed for heart rate greater than 100 45 tablet 2   dronedarone (MULTAQ) 400 MG tablet Take 400 mg by mouth 2 (two) times daily.     ELIQUIS 5 MG TABS tablet TAKE 1 TABLET(5 MG) BY MOUTH TWICE DAILY 180 tablet 1   Insulin Lispro Prot & Lispro (HUMALOG MIX 75/25 KWIKPEN) (75-25) 100 UNIT/ML Kwikpen Inject 25 Units into the skin 2 (two) times daily. (Patient taking differently: Inject 15-20 Units into the skin 2 (two) times daily.) 45 mL 4   levothyroxine (SYNTHROID) 75 MCG tablet TAKE 1  TABLET BY MOUTH  DAILY BEFORE BREAKFAST 90 tablet 3   lidocaine (LIDODERM) 5 % Place 1 patch onto the skin daily. Remove & Discard patch within 12 hours or as directed by MD (Patient not taking: Reported on 12/17/2021) 30 patch 0   metoprolol tartrate (LOPRESSOR) 25 MG tablet Take 1 tablet (25 mg total) by mouth 2 (two) times daily. 180 tablet 3   NOVOFINE PLUS PEN NEEDLE 32G X 4 MM MISC USE TWICE DAILY AS DIRECTED (Patient taking differently: 1 each by Other route as directed.) 100 each 11   ONETOUCH VERIO test strip 1 each by Other route 3 (three) times daily.     potassium chloride (KLOR-CON) 10 MEQ tablet Take 4 tablets (40 mEq total) by mouth daily. 120 tablet 0   RESTASIS MULTIDOSE 0.05 % ophthalmic emulsion Place 1 drop into both eyes 2 (two) times daily.      rosuvastatin (CRESTOR) 10 MG tablet TAKE 1 TABLET BY MOUTH IN  THE EVENING 90 tablet 3   Semaglutide, 1 MG/DOSE, (OZEMPIC, 1 MG/DOSE,) 4 MG/3ML SOPN Inject 1 mg into the skin once a week. (Patient taking differently: Inject 1 mg into the skin every Saturday.) 9 mL 3   torsemide (DEMADEX) 20 MG tablet Take 1 tablet (20 mg total) by mouth daily.     triamcinolone cream (KENALOG) 0.1 %  Apply 1 application topically 2 (two) times daily. (Patient not taking: Reported on 12/17/2021) 30 g 0   verapamil (CALAN) 120 MG tablet Take 1 tablet (120 mg total) by mouth in the morning. (Patient not taking: Reported on 12/17/2021) 180 tablet 3   vitamin B-12 (CYANOCOBALAMIN) 1000 MCG tablet Take 1,000 mcg by mouth daily.     No current facility-administered medications on file prior to visit.   Allergies  Allergen Reactions   Doxycycline     Made patient feel very ill    Metformin Diarrhea

## 2021-12-17 NOTE — Telephone Encounter (Signed)
I was trying to stay away from cardiac stimulant for rescue inhaler.  Best we could do would be to offer an Atrovent HFA inhaler. 2 puffs every 6 hours if needed. That is short acting and can be used as a rescue inhaler, which is what she wants. It still overlaps with Judithann Sauger, but so would any rescue inhaler

## 2021-12-17 NOTE — Progress Notes (Signed)
Chronic Care Management Pharmacy Note  12/17/2021 Name:  [name] MRN:  [id number] DOB:  Jul 12, 1941  Summary: -Per patient she feels she has been doing well since most recent hospital admission -Denies any issues with SOB / cough lately - notes that she was prescribed breztri but did not start due to feeling she did not need / concern about possible side effects - has on hand, planning to take prn  -After latest hospital admission - amiodarone, losartan, and verapamil had been stopped upon discharge- patient started on lisinopril, metoprolol, and multaq - patient's lisinopril stopped due to cough -Patient monitoring BP and HR at home, typically averaging 130-140/60-70 with HR averaging 60-70 - denies any issues with palpitations -Patient reduced insulin dosage due to low blood sugars she was having - takes 15-20 units twice daily depending on her BG - if <130 does not use  - BG recently averaging 118-145  Recommendations/Changes made from today's visit: - recommending no changes to medications at this time, BP and HR has been stable / patient denies any symptoms / fluid accumulation -Advised for patient to reach out to pulmonology - as she is not having any issues with breathing, likely best option would be to have albuterol inhaler to have for prn use rather than breztri -Patient to continue monitoring BG 3-4 times daily, reach out to endocrine about uncontrolled BG / hypoglycemia   Subjective: [name] is an 81 y.o. year old female who is a primary patient of Sagardia, Eilleen Kempf, MD.  The CCM team was consulted for assistance with disease management and care coordination needs.    Engaged with patient by telephone for follow-up visit in response to provider referral for pharmacy case management and/or care coordination services.   Consent to Services:  The patient was given the following information about Chronic Care Management services today, agreed to services, and gave  verbal consent: 1. CCM service includes personalized support from designated clinical staff supervised by the primary care provider, including individualized plan of care and coordination with other care providers 2. 24/7 contact phone numbers for assistance for urgent and routine care needs. 3. Service will only be billed when office clinical staff spend 20 minutes or more in a month to coordinate care. 4. Only one practitioner may furnish and bill the service in a calendar month. 5.The patient may stop CCM services at any time (effective at the end of the month) by phone call to the office staff. 6. The patient will be responsible for cost sharing (co-pay) of up to 20% of the service fee (after annual deductible is met). Patient agreed to services and consent obtained.  Patient Care Team: Georgina Quint, MD as PCP - General (Internal Medicine) Duke Salvia, MD as PCP - Electrophysiology (Cardiology) Sherren Mocha, MD as Resident (Family Medicine) [name] Corner, RN as Case Manager Birt Reinoso, Vinnie Level, [facility] as Pharmacist (Pharmacist) Aura Camps, MD as Consulting Physician (Ophthalmology)  Recent office visits: 12/11/2021 - Dr. Alvy Bimler - referral to dermatology for hematoma  10/30/2021 - Dr. Alvy Bimler - hospital f/u (chest pain and SOB) - no changes to medications  09/18/2021 - Dr. Alvy Bimler - Cough - rx'd hycodan syrup 08/29/2021 - Dr. Alvy Bimler - CAP has resolved - x ray much improved - no changes to medications    Recent consult visits: 12/13/2021 - Dr. Maple Hudson - Pulmonology - CT scan scheduled for March  - follow up in 1 month 12/06/2021 - Marlaine Hind - Cardiology - no  changes to medications - f/u in 6 months  10/31/2021 - Rubye Oaks NP - Pulmonology - no changes to medications - f/u in 3 months  10/24/2021 - Maxine Glenn PA-C - cardiology - switched from amiodarone to multaq with latest hospital admission - f/u in 2-3 months  10/04/2021 - Dr. Lonzo Cloud - Endocrine - humalog  mix decreased to 25mg  BID - f/u in 4 months  10/01/2021 - Dr. Graciela Husbands - Cardiology  - no changes to medications - scheduled for cardioversion 10/18/2021 09/30/2021 - Dr. Maple Hudson - Pulmonology - finish augmentin and prednisone - CT of chest ordered - follow up in 3 months 09/27/2021 - Micheline Maze NP - Pulmonology - noted increased SOB and productive cough - start breztri twice daily, depomedrol dose given today, prednisone prescribed for 5 days, augmentin for 7 days  08/23/2021 - Mordecai Maes NP - evaluation for abdominal pain - no changes to medications   Hospital visits: 10/11/2021 -10/20/2021 - Hospital admission for Afib, acute on chronic heart failure - underwent cardioversion - discontinued amiodarone, losartan, and verapamil - started multaq, lisinopril, and metoprolol tartrate   Objective:  Lab Results  Component Value Date   CREATININE 0.71 12/06/2021   BUN 17 12/06/2021   GFR 85.24 09/27/2021   GFRNONAA 78 11/29/2020   GFRAA 90 11/29/2020   NA 140 12/06/2021   K 3.5 12/06/2021   CALCIUM 9.2 12/06/2021   CO2 33 (H) 12/06/2021   GLUCOSE 150 (H) 12/06/2021    Lab Results  Component Value Date/Time   HGBA1C 8.3 11/14/2021 12:00 AM   HGBA1C 8.6 (H) 08/29/2021 12:11 PM   HGBA1C 7.8 (A) 05/24/2021 10:04 AM   HGBA1C 7.4 (A) 11/29/2020 10:33 AM   HGBA1C 7.7 (H) 09/12/2020 11:59 AM   GFR 85.24 09/27/2021 02:08 PM   GFR 75.34 08/23/2021 09:32 AM   MICROALBUR 6.3 (H) 06/21/2020 10:53 AM   MICROALBUR 2.48 (H) 08/21/2009 09:13 PM    Last diabetic Eye exam:  No results found for: HMDIABEYEEXA  Last diabetic Foot exam:  No results found for: HMDIABFOOTEX   Lab Results  Component Value Date   CHOL 141 08/29/2021   HDL 65.10 08/29/2021   LDLCALC 51 08/29/2021   TRIG 127.0 08/29/2021   CHOLHDL 2 08/29/2021    Hepatic Function Latest Ref Rng & Units 08/23/2021 07/17/2021 03/12/2021  Total Protein 6.0 - 8.3 g/dL 7.2 7.8 7.0  Albumin 3.5 - 5.2 g/dL 4.0 3.9 4.2  AST 0 - 37 U/L 23 18 20    ALT 0 - 35 U/L 20 15 17   Alk Phosphatase 39 - 117 U/L 126(H) 100 125(H)  Total Bilirubin 0.2 - 1.2 mg/dL 0.6 0.5 0.4  Bilirubin, Direct 0.0 - 0.3 mg/dL - 0.1 -    Lab Results  Component Value Date/Time   TSH 3.350 03/12/2021 11:52 AM   TSH 2.050 07/05/2020 10:07 AM   FREET4 1.43 02/21/2020 08:46 AM   FREET4 1.24 (H) 05/22/2016 09:45 AM    CBC Latest Ref Rng & Units 12/06/2021 08/23/2021 07/17/2021  WBC 3.4 - 10.8 x10E3/uL 12.5(H) 11.3(H) 11.2(H)  Hemoglobin 11.1 - 15.9 g/dL 10.9(L) 11.5(L) 11.7(L)  Hematocrit 34.0 - 46.6 % 33.6(L) 36.5 37.1  Platelets 150 - 450 x10E3/uL 363 346.0 352.0    Lab Results  Component Value Date/Time   VD25OH 21.09 (L) 01/25/2021 10:16 AM   VD25OH 23.37 (L) 01/23/2021 03:40 PM    Clinical ASCVD: Yes  The 10-year ASCVD risk score (Arnett DK, et al., 2019) is: 43.1%   Values  used to calculate the score:     Age: 89 years     Sex: Female     Is Non-Hispanic African American: No     Diabetic: Yes     Tobacco smoker: No     Systolic Blood Pressure: 118 mmHg     Is BP treated: Yes     HDL Cholesterol: 65.1 mg/dL     Total Cholesterol: 141 mg/dL    Depression screen [facility] 2/9 11/08/2021 07/24/2021 03/25/2021  Decreased Interest 0 0 0  Down, Depressed, Hopeless 0 0 0  PHQ - 2 Score 0 0 0  Some recent data might be hidden    Social History   Tobacco Use  Smoking Status Never  Smokeless Tobacco Never   BP Readings from Last 3 Encounters:  12/13/21 118/80  12/11/21 132/82  12/06/21 (!) 152/62   Pulse Readings from Last 3 Encounters:  12/13/21 62  12/11/21 89  12/06/21 (!) 59   Wt Readings from Last 3 Encounters:  12/13/21 240 lb (108.9 kg)  12/11/21 241 lb (109.3 kg)  12/06/21 241 lb (109.3 kg)   BMI Readings from Last 3 Encounters:  12/13/21 45.35 kg/m  12/11/21 45.54 kg/m  12/06/21 45.54 kg/m    Assessment/Interventions: Review of patient past medical history, allergies, medications, health status, including review of consultants  reports, laboratory and other test data, was performed as part of comprehensive evaluation and provision of chronic care management services.   SDOH:  (Social Determinants of Health) assessments and interventions performed: Yes  SDOH Screenings   Alcohol Screen: Low Risk    Last Alcohol Screening Score (AUDIT): 0  Depression (PHQ2-9): Low Risk    PHQ-2 Score: 0  Financial Resource Strain: Low Risk    Difficulty of Paying Living Expenses: Not hard at all  Food Insecurity: No Food Insecurity   Worried About Programme researcher, broadcasting/film/video in the Last Year: Never true   Ran Out of Food in the Last Year: Never true  Housing: Low Risk    Last Housing Risk Score: 0  Physical Activity: Inactive   Days of Exercise per Week: 0 days   Minutes of Exercise per Session: 0 min  Social Connections: Press photographer of Communication with Friends and Family: More than three times a week   Frequency of Social Gatherings with Friends and Family: Never   Attends Religious Services: More than 4 times per year   Active Member of [employer] or Organizations: Yes   Attends Engineer, structural: More than 4 times per year   Marital Status: Married  Stress: No Stress Concern Present   Feeling of Stress : Not at all  Tobacco Use: Low Risk    Smoking Tobacco Use: Never   Smokeless Tobacco Use: Never   Passive Exposure: Not on file  Transportation Needs: No Transportation Needs   Lack of Transportation (Medical): No   Lack of Transportation (Non-Medical): No    CCM Care Plan  Allergies  Allergen Reactions   Doxycycline     Made patient feel very ill    Metformin Diarrhea    Medications Reviewed Today     Reviewed by Ellin Saba, [facility] (Pharmacist) on 12/17/21 at 1126  Med List Status: <None>   Medication Order Taking? Sig Documenting Provider Last Dose Status Informant  Accu-Chek Softclix Lancets lancets [id number] Yes 1 each by Other route 3 (three) times daily. as directed Janeece Agee, NP Taking Active Self  Med Note Vernard Gambles   Tue Mar 12, 2021 11:12 AM)    allopurinol (ZYLOPRIM) 300 MG tablet [id number] Yes TAKE 1 TABLET(300 MG) BY MOUTH DAILY Georgina Quint, MD Taking Active   b complex vitamins capsule [id number] Yes Take 1 capsule by mouth daily. [provider] Taking Active Self  blood glucose meter kit and supplies [id number] Yes Dispense based on patient and insurance preference. Use up to four times daily as directed. (FOR ICD-10 E10.9, E11.9). Georgina Quint, MD Taking Active Self  BREZTRI AEROSPHERE 160-9-4.8 MCG/ACT AERO [id number] No USE 2 INHALATIONS BY MOUTH IN  THE MORNING AND AT BEDTIME  Patient not taking: Reported on 12/17/2021   Noemi Chapel, NP Not Taking Active   cholecalciferol (VITAMIN D3) 25 MCG (1000 UNIT) tablet [id number] Yes Take 1,000 Units by mouth daily. [provider] Taking Active Self  cloNIDine (CATAPRES) 0.3 MG tablet [id number] Yes Take 1 tablet (0.3 mg total) by mouth 2 (two) times daily. Georgina Quint, MD Taking Active Self  Continuous Blood Gluc Receiver (FREESTYLE LIBRE 14 DAY READER) [place] [id number] Yes Use as directed. Georgina Quint, MD Taking Active Self  Continuous Blood Gluc Sensor (FREESTYLE LIBRE [address] [place]) [place] [id number] Yes Use as directed to check blood sugar daily Georgina Quint, MD Taking Active Self  diltiazem (CARDIZEM) 30 MG tablet [id number] Yes Take 1 tablet (30 mg total) by mouth See admin instructions. Take one tablet every 4 hours as needed for heart rate greater than 100 Newman Nip, NP Taking Active Self           Med Note [facility], CARLOS A   Thu Oct 24, 2021  9:49 AM)    dronedarone (MULTAQ) 400 MG tablet [id number] Yes Take 400 mg by mouth 2 (two) times daily. [provider] Taking Active   ELIQUIS 5 MG TABS tablet [id number] Yes TAKE 1 TABLET(5 MG) BY MOUTH TWICE DAILY Duke Salvia, MD Taking Active Self   Insulin Lispro Prot & Lispro (HUMALOG MIX 75/25 KWIKPEN) (75-25) 100 UNIT/ML Stephanie Coup [id number] Yes Inject 25 Units into the skin 2 (two) times daily.  Patient taking differently: Inject 15-20 Units into the skin 2 (two) times daily.   [name], Konrad Dolores, MD Taking Active Self           Med Note Otelia Limes Nov 27, 2021 11:37 AM) 11/27/21- patient reports consistently taking 15-17 Units BID; adjusts around blood sugars at home-- reports was "too low" with 25 Units  levothyroxine (SYNTHROID) 75 MCG tablet [id number] Yes TAKE 1 TABLET BY MOUTH  DAILY BEFORE BREAKFAST Georgina Quint, MD Taking Active   lidocaine (LIDODERM) 5 % [id number] No Place 1 patch onto the skin daily. Remove & Discard patch within 12 hours or as directed by MD  Patient not taking: Reported on 12/17/2021   Kathryne Hitch, MD Not Taking Active Self           Med Note Ansel Bong May 08, 2021 10:52 AM)    Patient not taking:  Discontinued 12/17/21 1125 (Patient Preference)          Med Note Rubin Payor, THOMAS R   Mon Jul 09, 2020 11:11 AM)    metoprolol tartrate (LOPRESSOR) 25 MG tablet [id number] Yes Take 1 tablet (25 mg total) by mouth 2 (two) times daily. Georgina Quint, MD Taking Active   NOVOFINE PLUS PEN NEEDLE 32G X 4 MM MISC  [id number] Yes USE TWICE DAILY AS DIRECTED  Patient taking differently: 1 each by Other route as directed.   Georgina Quint, MD Taking Active Self  [facility] VERIO test strip [id number] Yes 1 each by Other route 3 (three) times daily. [provider] Taking Active Self           Med Note Wyvonnia Dusky, Emi [place]   Wed May 08, 2021 10:53 AM)    potassium chloride (KLOR-CON) 10 MEQ tablet [id number] Yes Take 4 tablets (40 mEq total) by mouth daily. Graciella Freer, PA-C Taking Active   RESTASIS MULTIDOSE 0.05 % ophthalmic emulsion [id number] Yes Place 1 drop into both eyes 2 (two) times daily.  [provider] Taking  Active Self  rosuvastatin (CRESTOR) 10 MG tablet [id number] Yes TAKE 1 TABLET BY MOUTH IN  THE EVENING Sagardia, Eilleen Kempf, MD Taking Active Self  Semaglutide, 1 MG/DOSE, (OZEMPIC, 1 MG/DOSE,) 4 MG/3ML SOPN [id number] Yes Inject 1 mg into the skin once a week.  Patient taking differently: Inject 1 mg into the skin every Saturday.   [name], Konrad Dolores, MD Taking Active Self  torsemide (DEMADEX) 20 MG tablet [id number] Yes Take 1 tablet (20 mg total) by mouth daily. Newman Nip, NP Taking Active Self  triamcinolone cream (KENALOG) 0.1 % [id number] No Apply 1 application topically 2 (two) times daily.  Patient not taking: Reported on 12/17/2021   Georgina Quint, MD Not Taking Active Self           Med Note Vernard Gambles   Tue Mar 12, 2021 11:12 AM)    verapamil (CALAN) 120 MG tablet [id number] No Take 1 tablet (120 mg total) by mouth in the morning.  Patient not taking: Reported on 12/17/2021   Duke Salvia, MD Not Taking Active Self  vitamin B-12 (CYANOCOBALAMIN) 1000 MCG tablet [id number] Yes Take 1,000 mcg by mouth daily. [provider] Taking Active Self            Patient Active Problem List   Diagnosis Date Noted   Asthma 11/01/2021   Normocytic anemia 10/11/2021   Nodule of right lung 09/30/2021   Pneumonia 07/17/2021   Dysthymia 03/12/2021   Dysuria 03/01/2021   Primary osteoarthritis involving multiple joints 03/01/2021   Recurrent falls 01/10/2021   Sensorineural hearing loss (SNHL), 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   Persistent atrial fibrillation (HCC) 08/12/2020   Type 2 diabetes mellitus with hyperglycemia, with long-term current use of insulin (HCC) 06/21/2020   Type 2 diabetes mellitus with diabetic polyneuropathy, with long-term current use of insulin (HCC) 06/21/2020   Mixed hyperlipidemia 06/21/2020   Uncontrolled hypertension 06/11/2020   Aortic atherosclerosis (HCC) 03/28/2020   Diabetic peripheral neuropathy (HCC) 12/26/2019    Gastroesophageal reflux disease without esophagitis 03/17/2019   Bilateral leg edema 02/16/2019   Varicose veins of both legs with edema 02/16/2019   Heart murmur 09/05/2018   Restrictive lung disease 02/22/2018   Dysgeusia 06/11/2017   Osteoarthritis of left knee 06/03/2017   NAFLD (nonalcoholic fatty liver disease) 57/32/2025   Osteoporosis 12/31/2015   Obesity 12/19/2015   Edema 10/18/2015   Acquired hypothyroidism 10/18/2015   Prolonged QT interval 10/03/2013   Long term (current) use of anticoagulants 06/09/2012   BENIGN NEOPLASM OF ADRENAL GLAND 11/25/2010   Chronic diastolic heart failure (HCC) 02/20/2010   DM 05/16/2009   GOUT 05/16/2009   OBESITY, MORBID 05/16/2009   Hypertension associated with diabetes (HCC) 05/16/2009   Paroxysmal atrial fibrillation (HCC) 05/16/2009   HYPERLIPIDEMIA 11/30/2008  Obstructive sleep apnea on CPAP 11/30/2008    Immunization History  Administered Date(s) Administered   Fluad Quad(high Dose 65+) 12/31/2015   Influenza Split 09/20/2011, 07/13/2012, 12/31/2015   Influenza, High Dose Seasonal PF 08/07/2016, 08/04/2017, 07/21/2018, 07/22/2019   Influenza,inj,Quad PF,6+ Mos 10/04/2013   Influenza,inj,quad, With Preservative 10/04/2013   Influenza-Unspecified 09/20/2011, 07/13/2012, 10/04/2013, 08/07/2016   Pneumococcal Conjugate-13 07/15/2012, 05/11/2014   Pneumococcal Polysaccharide-23 07/13/2012   Pneumococcal-Unspecified 07/15/2012   Zoster, Live 07/29/2012    Conditions to be addressed/monitored:  Hypertension, Hyperlipidemia, Diabetes, Atrial Fibrillation, Heart Failure, Hypothyroidism, Osteoarthritis, Gout, and Dry Eyes  Care Plan : CCM Care Plan  Updates made by Ellin Saba, RPH since 12/17/2021 12:00 AM     Problem: HTN, HLD, HF, Afib, Chronic Pain of right shoulder, Dry Eye, Hypothyroidism, DM2   Priority: High  Onset Date: 05/20/2021     Long-Range Goal: Disease Management   Start Date: 05/20/2021  Expected End Date:  12/17/2022  This Visit's Progress: On track  Recent Progress: On track  Priority: High  Note:   Current Barriers:  Unable to independently monitor therapeutic efficacy Unable to achieve control of blood sugar/ A1c   Pharmacist Clinical Goal(s):  Patient will achieve adherence to monitoring guidelines and medication adherence to achieve therapeutic efficacy achieve control of A1c as evidenced by blood sugar logs maintain control of LDL, Afib / HF as evidenced by next lipid panel / blood pressure and heart rate logs  through collaboration with PharmD and provider.   Interventions: 1:1 collaboration with Georgina Quint, MD regarding development and update of comprehensive plan of care as evidenced by provider attestation and co-signature Inter-disciplinary care team collaboration (see longitudinal plan of care) Comprehensive medication review performed; medication list updated in electronic medical record  Hyperlipidemia: (LDL goal < 70) -Controlled Lab Results  Component Value Date   LDLCALC 51 08/29/2021  -Current treatment: Rosuvastatin 10mg  daily  -Medications previously tried: n/a  -Current dietary patterns: reports to eating more fish and chicken, avoids red meats / fried or fatty foods -Current exercise habits: limited -Educated on Cholesterol goals;  Benefits of statin for ASCVD risk reduction; Importance of limiting foods high in cholesterol; -Counseled on diet and exercise extensively Recommended to continue current medication  Diabetes (A1c goal <7%) -Not ideally controlled Lab Results  Component Value Date   HGBA1C 8.3 11/14/2021  -Current medications: Humalog mix 75/25 - 15-20 units twice daily per patient Ozempic 1mg  weekly -Medications previously tried: metformin, glimepiride  -Current home glucose readings fasting glucose: reports that blood sugars have been averaging 118-145 - highest she could recall was 168 which was after she had been released from  hospital  -Denies hypoglycemic/hyperglycemic symptoms -Current meal patterns:  breakfast: eggs, oatmeal with coffee, on occasion may eat cereal  lunch: cottage cheese, pineapple, tunafish sandwich  dinner: fish, shrimp, chicken, + vegetable  snacks: apple, orange, plantains drinks: water, diet soda -Current exercise: limited at this time -Educated on A1c and blood sugar goals; Complications of diabetes including kidney damage, retinal damage, and cardiovascular disease; Exercise goal of 150 minutes per week; Prevention and management of hypoglycemic episodes; Benefits of routine self-monitoring of blood sugar; Continuous glucose monitoring; Carbohydrate counting and/or plate method -Counseled to check feet daily and get yearly eye exams -Counseled on diet and exercise extensively Recommended for patient to continue current medications  Atrial Fibrillation (Goal: prevent stroke and major bleeding) -Controlled -CHADSVASC: at least 5 -Current treatment: Rate control: Multaq 400mg  BID / metoprolol tartrate 25mg  BID / diltiazem  30mg  - 1 tablet every 4 hours as needed (if in afib) Anticoagulation: Eliquis 5mg  twice daily  -Medications previously tried: amiodarone dofetilide -Home BP and HR readings: ~130-140/60-70 HR 60-70bpm -Counseled on increased risk of stroke due to Afib and benefits of anticoagulation for stroke prevention; importance of adherence to anticoagulant exactly as prescribed; bleeding risk associated with Eliquis and importance of self-monitoring for signs/symptoms of bleeding; avoidance of NSAIDs due to increased bleeding risk with anticoagulants; importance of regular laboratory monitoring; seeking medical attention after a head injury or if there is blood in the urine/stool; -Recommended to continue current medication  Heart Failure (Goal: manage symptoms and prevent exacerbations)/Hypertension (BP goal <140/90) -Unsure of current level of control -Last ejection  fraction: 65-70% (Date: 06/18/2021) -HF type: Diastolic -NYHA Class: III (marked limitation of activity) -Current treatment: Clonidine 0.3mg  - 1 tablet twice daily  Metoprolol Tartrate 25mg  BID Torsemide 20mg  - 2 tablets daily - patient taking 1 tablet daily due to excessive urination  Potassium Chloride - 2 tablets daily - has not been taking Lab Results  Component Value Date   K 3.5 12/06/2021  -Medications previously tried: triamterene/hctz, spironolactone, metoprolol, lisinopril, isosorbide mononitrate, furosemide, eplerenone, amlodipine, losartan, verapamil    -Current home BP/HR readings: reports that BP averaged 130-140/60-70, HR 60-70 bpm -Current dietary habits: eats a sodium reduced diet, does not add any additional salt to her foods -Current exercise habits: limited -Educated on Benefits of medications for managing symptoms and prolonging life Importance of weighing daily; if you gain more than 3 pounds in one day or 5 pounds in one week, make clinic aware Proper diuretic administration and potassium supplementation - patient agreeable to resume potassium supplementation  Importance of blood pressure control -Counseled on diet and exercise extensively Recommended to continue current medication - to reach out to cardiology with issues or uncontrolled BP / HR   Hypothyroidism (Goal: Maintenance of euthyroid levels) -Controlled Lab Results  Component Value Date   TSH 3.350 03/12/2021   -Current treatment  Levothyroxine - 1 tablet daily  -Medications previously tried: n/a  -Recommended to continue current medication  Gout (Goal: Prevention of gout attack / control of pain) -Controlled -Current treatment  Allopurinol 300mg  daily  -Medications previously tried: n/a  -Recommended to continue current medication  Dry Eye (Goal: prevention of irritation of eye) -Controlled -Current treatment  Restasis 0.05% - 1 drop into both eyes twice daily  -Medications  previously tried: ketorolac, olopatadine  -Recommended to continue current medication  Health Maintenance -Vaccine gaps: Influenza, COVID booster, and Shingles vaccines -Current therapy:  Triamcinolone 0.1% cream - applied daily as needed  Vitamin B12 daily  Vitamin B complex Vitamin D3 daily  -Educated on Cost vs benefit of each product must be carefully weighed by individual consumer -Patient is satisfied with current therapy and denies issues -Recommended to continue current medication  Patient Goals/Self-Care Activities Patient will:  - take medications as prescribed check glucose at least once daily, document, and provide at future appointments check blood pressure once daily, document, and provide at future appointments weigh daily, and contact provider if weight gain of more than 3lbs in a day or more than 5 pounds in a week engage in dietary modifications by reducing sodium intake, moderation of carbohydrate / foods high in cholesterol  Follow Up Plan: Telephone follow up appointment with care management team member scheduled for: 3 months The patient has been provided with contact information for the care management team and has been advised to call  with any health related questions or concerns.        Medication Assistance: None required.  Patient affirms current coverage meets needs.  Patient's preferred pharmacy is:  [phone] [address] - [place], [place] - [address] [place] [address] Phone: [phone] Fax: [phone]  [facility]) - [place], [place] - [address] [facility] [address] [place] [address] [place] [address] Phone: [phone] Fax: [phone]  [id number] - [place], [place] - [address] [facility] [address] [place] [address] Phone: [phone] Fax: [phone]  [facility] ([facility]  ) - [place], [place] - [address] [place] [address] Phone: [phone] Fax: [phone]   Uses pill box? Yes Pt endorses 90% compliance  Care Plan and Follow Up Patient Decision:  Patient agrees to Care Plan and Follow-up.  Plan: Telephone follow up appointment with care management team member scheduled for:  3 months and The patient has been provided with contact information for the care management team and has been advised to call with any health related questions or concerns.   Ellin Saba, PharmD Clinical Pharmacist, [facility]

## 2021-12-18 ENCOUNTER — Telehealth: Payer: Self-pay | Admitting: Internal Medicine

## 2021-12-18 DIAGNOSIS — E1142 Type 2 diabetes mellitus with diabetic polyneuropathy: Secondary | ICD-10-CM | POA: Diagnosis not present

## 2021-12-18 DIAGNOSIS — Z794 Long term (current) use of insulin: Secondary | ICD-10-CM | POA: Diagnosis not present

## 2021-12-19 ENCOUNTER — Telehealth: Payer: Self-pay | Admitting: Internal Medicine

## 2021-12-19 ENCOUNTER — Other Ambulatory Visit (HOSPITAL_COMMUNITY): Payer: Self-pay

## 2021-12-19 MED ORDER — DRONEDARONE HCL 400 MG PO TABS: 400.0000 mg | ORAL_TABLET | Freq: Two times a day (BID) | ORAL | 3 refills | Status: DC

## 2021-12-19 NOTE — Telephone Encounter (Signed)
Called and spoke with pharmacy. Atrovent HFA was filled and picked up. Cost was $4.30 (four dollars and thirty cents) Pt may have asked the cost of medication prior to insurance, but pt out of pocked expense was $4.30 (four dollars and thirty cents)

## 2021-12-19 NOTE — Telephone Encounter (Signed)
Pt's medication was sent to pt's pharmacy as requested. Confirmation received.  °

## 2021-12-19 NOTE — Telephone Encounter (Signed)
?*  STAT* If patient is at the pharmacy, call can be transferred to refill team. ? ? ?1. Which medications need to be refilled? (please list name of each medication and dose if known) dronedarone (MULTAQ) 400 MG tablet ? ?2. Which pharmacy/location (including street and city if local pharmacy) is medication to be sent to? ? ?[facility] #[id number] - [place], [place] AT [place] Phone:  [phone]  ?Fax:  [phone]  ?  ? ? ?3. Do they need a 30 day or 90 day supply? 90 ds ? ?

## 2021-12-19 NOTE — Telephone Encounter (Signed)
Called patient back this morning and she is stating that when she went to her pharmacy her Atrovent HFA inhaler is going to cost her almost $500 out of pocket.I advised patient that I would sent this message to our prior auth team to see what could be done for her.  ? ?Please adivse  ?

## 2021-12-20 ENCOUNTER — Other Ambulatory Visit: Payer: Self-pay

## 2021-12-20 ENCOUNTER — Other Ambulatory Visit: Payer: Medicare Other | Admitting: *Deleted

## 2021-12-20 DIAGNOSIS — E876 Hypokalemia: Secondary | ICD-10-CM

## 2021-12-20 NOTE — Telephone Encounter (Signed)
Called and spoke with patient and she did clarify that she did pay 4.30 for her inhaler and was just confused. She had no questions noted for me at this time. Nothing further needed  ?

## 2021-12-21 LAB — BASIC METABOLIC PANEL
BUN/Creatinine Ratio: 19 (ref 12–28)
BUN: 15 mg/dL (ref 8–27)
CO2: 26 mmol/L (ref 20–29)
Calcium: 9 mg/dL (ref 8.7–10.3)
Chloride: 100 mmol/L (ref 96–106)
Creatinine, Ser: 0.79 mg/dL (ref 0.57–1.00)
Glucose: 133 mg/dL — ABNORMAL HIGH (ref 70–99)
Potassium: 4.1 mmol/L (ref 3.5–5.2)
Sodium: 141 mmol/L (ref 134–144)
eGFR: 77 mL/min/{1.73_m2} (ref >59)

## 2021-12-23 ENCOUNTER — Other Ambulatory Visit: Payer: Medicare Other

## 2021-12-30 ENCOUNTER — Ambulatory Visit: Payer: Medicare Other | Admitting: Internal Medicine

## 2021-12-31 ENCOUNTER — Other Ambulatory Visit: Payer: Self-pay

## 2021-12-31 ENCOUNTER — Ambulatory Visit
Admission: RE | Admit: 2021-12-31 | Discharge: 2021-12-31 | Disposition: A | Discharge status: Home / Self Care | Payer: Medicare Other | Source: Ambulatory Visit | Attending: Nurse Practitioner | Admitting: Nurse Practitioner

## 2021-12-31 DIAGNOSIS — R911 Solitary pulmonary nodule: Secondary | ICD-10-CM

## 2021-12-31 DIAGNOSIS — R0602 Shortness of breath: Secondary | ICD-10-CM | POA: Diagnosis not present

## 2022-01-01 NOTE — Progress Notes (Signed)
HPI ?F never smoker followed for OSA, Asthma/ COPD, complicated by dCHF, Aortic Atherosclerosis, HTN, PAFib/Amiodarone/Eliquis, Varicose Veins, Restrictive Lung Disease, GERD, Nonalcoholic Fatty Liver, Hypothyroid, DM2/ neuropathy, GOUT, Morbid Obesity,  ?NPSG 06/05/11-AHI 20.1/ hr, desaturation to  87%, body weight 261 lbs, CPAP 10 ?PFT 03/25/17- moderately severe obstruction, insignificant response to BD, Nl TLC, DLCO mildly reduced. ?============================================================================== ? ? ?12/13/21- 80 yoF never smoker followed for OSA, Asthma/ COPD, Chronic Cough, Lung Nodule, complicated by dCHF, Aortic Atherosclerosis, HTN, PAFib/Amiodarone/Eliquis, Varicose Veins, Restrictive Lung Disease, GERD, Nonalcoholic Fatty Liver, Hypothyroid, DM2/ neuropathy, GOUT, Morbid Obesity,  ?          -no bronchodilators ?-  Hycodan, amiodarone, Breztri ?CPAP auto 5-20/ [facility]/ [facility]    replaced 2017   replacement ordered 01/03/21   ?Download-compliance 29.3%, AHI 0.6/ hr.    Used 79% of days but averaging only 3 hours ?Body weight today-240 lbs ?Covid vax-no ?Flu vax-no ?Saw PCP 2/23 for hematoma RLL on Eliquis. ?Saw NP 1/12 post hosp [facility] for CHF and Pneumonia, AFib> cardioversion ?Has Super D Chest CT scheduled for 12/31/21 for f/u 60m R lung nodule noted on December CT.  ?Pressure Range usually 6-8 cwp ?-----Patient is doing good, no concerns ?She blames physical difficulty getting in and out of bed, and polyuria on diuretics for limited CPAP use.  She puts it on almost every night but cannot keep it on more than a few hours.  Download reviewed with her. ?Currently her breathing is comfortable.  Denies ankle edema. ?CXR 10/31/21- ?IMPRESSION: ?Cardiomegaly with mild central congestion. Possible tiny pleural ?effusions. ? ?01/02/22-80 yoF never smoker followed for OSA, Asthma/ COPD, Chronic Cough, Lung Nodule, complicated by dCHF, Aortic Atherosclerosis, HTN, PAFib/Amiodarone/Eliquis, Varicose  Veins, Restrictive Lung Disease, GERD, Nonalcoholic Fatty Liver, Hypothyroid, DM2/ neuropathy, GOUT, Morbid Obesity,  ?          -no bronchodilators ?-  Hycodan, amiodarone, Breztri ?CPAP auto 5-20/ Luna/ [facility]    replaced 2017   replacement ordered 01/03/21   ?Download-compliance  ?Body weight today- ?Covid vax-no ?Flu vax-no ?Not using the ipratropium inhaler yet- wanted to discuss. ?She did not understand how to use the Atrovent inhaler-I demonstrated.  The idea was to have a noncardio stimulant bronchodilator, so she will try this. ?She has not been using CPAP much because she complains of runny nose.  We are sending azelastine nasal spray with discussion. ?She complains of increased shortness of breath only some days.  Today she says her breathing is normal for her.  We agreed there is probably her cardiac status that would change that quickly. ?She asked about oxygen status.  Arrival saturation on room air was 100% today.  We will check overnight oximetry on her CPAP. ?We reviewed the results of recent chest CT.  Nodule has diminished.  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  We will recheck CT in 4 months for comparison.  She is no longer taking amiodarone. ?CT Super D chest 01/01/22- ?IMPRESSION: ?1. Ground-glass and septal thickening in the chest most suggestive ?of sequela of heart failure given the small RIGHT-sided pleural ?effusion that has developed in the interim. Given mosaic pattern of ?attenuation would also consider the possibility of developing ?pulmonary drug toxicity in this patient with history of amiodarone ?therapy. Consider pulmonary consultation if not yet obtained with ?follow-up high-resolution chest CT as warranted. ?2. Resolution of the RIGHT lower lobe nodule that was seen on ?previous imaging. ?3. Waxing and waning size of mediastinal lymph nodes on the LEFT ?likely reactive  and or related to underlying heart failure, not ?substantially changed since 2015. ?4. 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 ?5. 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 Could ?consider biochemical correlation if not yet performed given size of ?the lesion. ?6. Aortic atherosclerosis and three-vessel coronary artery disease. ?7. Signs of mitral annular calcification and three-vessel coronary ?artery disease. ?8. 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 ?Aortic Atherosclerosis (ICD10-I70.0). ? ? ?ROS-see HPI   + = positive ?Constitutional:    weight loss, night sweats, fevers, chills, fatigue, lassitude. ?HEENT:    headaches, difficulty swallowing, tooth/dental problems, sore throat,  ?     sneezing, itching, ear ache, nasal congestion, post nasal drip, snoring ?CV:    chest pain, orthopnea, PND, swelling in lower extremities, anasarca,                                   ?dizziness, palpitations ?Resp:   +shortness of breath with exertion or at rest.   ?             productive cough,   non-productive cough, coughing up of blood.   ?           change in color of mucus.  wheezing.   ?Skin:    rash or lesions. ?GI:  No-   heartburn, indigestion, abdominal pain, nausea, vomiting, diarrhea,  ?               change in bowel habits, loss of appetite ?GU: dysuria, change in color of urine, +urgency or frequency.   flank pain. ?MS:   joint pain, stiffness, decreased range of motion, back pain. ?Neuro-     nothing unusual ?Psych:  change in mood or affect.  depression or anxiety.   memory loss. ? ?OBJ- Physical Exam ?General- Alert, Oriented, Affect-appropriate, Distress- none acute, + morbidly obese, + wheelchair ?Skin- rash-none, lesions- none, excoriation- none ?Lymphadenopathy- none ?Head- atraumatic ?           Eyes- Gross vision intact, PERRLA, conjunctivae and secretions clear,  ?           Ears- Hearing, canals-normal ?           Nose- Clear, no-Septal dev, mucus, polyps,  erosion, perforation  ?           Throat- Mallampati II-III , mucosa clear , drainage- none, tonsils- absent, + teeth ?Neck- flexible , trachea midline, no stridor , thyroid nl, carotid no bruit ?Chest - symmetrical excursion , unlabored ?          Heart/CV- RRR/ AFib , + murmur 1-2+S , no gallop  , no rub, nl s1 s2 ?                          - JVD-none, edema- none, stasis changes- none, varices- none ?          Lung- clear to P&A, no rales or crackles, wheeze- none, cough- none , dullness-none, rub- none ?          Chest wall-  ?Abd-  ?Br/ Gen/ Rectal- Not done, not indicated ?Extrem- cyanosis- none, clubbing, none, atrophy- none, strength- nl ?Neuro- grossly intact to observation ? ? ?

## 2022-01-01 NOTE — Progress Notes (Signed)
Pt has follow up scheduled with Dr. Annamaria Boots tomorrow; however, please notify her that the previously identified suspicious pulmonary nodule that we were following has resolved, which is good. She does have some findings consistent with heart failure and/or amiodarone toxicity, which she will discuss further with Dr. Annamaria Boots tomorrow. Advise her to follow up with her PCP regarding the right adrenal mass, which has been present on prior imaging, to determine next steps if indicated. Thanks!

## 2022-01-02 ENCOUNTER — Encounter: Payer: Self-pay | Admitting: Internal Medicine

## 2022-01-02 ENCOUNTER — Other Ambulatory Visit: Payer: Self-pay

## 2022-01-02 ENCOUNTER — Ambulatory Visit (INDEPENDENT_AMBULATORY_CARE_PROVIDER_SITE_OTHER): Payer: Medicare Other | Admitting: Internal Medicine

## 2022-01-02 VITALS — BP 130/88 | HR 67 | Temp 98.3°F | Ht 61.0 in | Wt 242.0 lb

## 2022-01-02 DIAGNOSIS — J849 Interstitial pulmonary disease, unspecified: Secondary | ICD-10-CM | POA: Diagnosis not present

## 2022-01-02 DIAGNOSIS — G4734 Idiopathic sleep related nonobstructive alveolar hypoventilation: Secondary | ICD-10-CM

## 2022-01-02 DIAGNOSIS — G4733 Obstructive sleep apnea (adult) (pediatric): Secondary | ICD-10-CM | POA: Diagnosis not present

## 2022-01-02 DIAGNOSIS — J984 Other disorders of lung: Secondary | ICD-10-CM | POA: Diagnosis not present

## 2022-01-02 DIAGNOSIS — Z9989 Dependence on other enabling machines and devices: Secondary | ICD-10-CM

## 2022-01-02 MED ORDER — AZELASTINE HCL 0.1 % NA SOLN: NASAL | 12 refills | Status: DC

## 2022-01-02 NOTE — Assessment & Plan Note (Addendum)
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 ?Plan-high-resolution chest CT in 4 months.  Overnight oximetry on CPAP on room air. ?

## 2022-01-02 NOTE — Assessment & Plan Note (Signed)
She blames runny nose for difficulty with CPAP compliance. ?Plan-continue auto 5-20.  Prescription for azelastine nasal spray. ?

## 2022-01-02 NOTE — Patient Instructions (Signed)
Ok to use the Atrovent inhaler if you are short of breath- see if it helps    ?  Inhale 2 puffs, up to every 6 hours, if needed ? ?Order- overnight oximetry on CPAP on room air    dx Nocturnal Hypoxemia ? ?Order- HRCT chest in 4 months    question amiodarone lung toxicity ? ?Script sent for azelastine nasal spray   try 1-2 puffs of this in each nostril at bedtime if needed, before you put your CPAP on, to help with runny nose. ?

## 2022-01-07 ENCOUNTER — Ambulatory Visit (INDEPENDENT_AMBULATORY_CARE_PROVIDER_SITE_OTHER): Payer: Medicare Other | Admitting: *Deleted

## 2022-01-07 DIAGNOSIS — E1165 Type 2 diabetes mellitus with hyperglycemia: Secondary | ICD-10-CM

## 2022-01-07 DIAGNOSIS — I5032 Chronic diastolic (congestive) heart failure: Secondary | ICD-10-CM

## 2022-01-07 NOTE — Chronic Care Management (AMB) (Signed)
?Chronic Care Management  ? ?CCM RN Visit Note ? ?01/07/2022 ?Name: [name] MRN: [id number] DOB: [birth year] ? ?Subjective: ?[name] is a 81 y.o. year old female who is a primary care patient of Mitchel Honour, Ines Bloomer, MD. The care management team was consulted for assistance with disease management and care coordination needs.   ? ?Engaged with patient by telephone for follow up visit in response to provider referral for case management and/or care coordination services.  ? ?Consent to Services:  ?The patient was given information about Chronic Care Management services, agreed to services, and gave verbal consent prior to initiation of services.  Please see initial visit note for detailed documentation.  ?Patient agreed to services and verbal consent obtained.  ? ?Assessment: Review of patient past medical history, allergies, medications, health status, including review of consultants reports, laboratory and other test data, was performed as part of comprehensive evaluation and provision of chronic care management services.  ? ?SDOH (Social Determinants of Health) assessments and interventions performed:  ?SDOH Interventions   ? ?Flowsheet Row Most Recent Value  ?SDOH Interventions   ?Food Insecurity Interventions Intervention Not Indicated  [Continues to deny food insecurity]  ?Transportation Interventions Intervention Not Indicated  [Continues to drive self,  family assists as/ if indicated]  ? ?  ? ?CCM Care Plan ? ?Allergies  ?Allergen Reactions  ? Doxycycline   ?  Made patient feel very ill   ? Metformin Diarrhea  ? ?Outpatient Encounter Medications as of 01/07/2022  ?Medication Sig Note  ? Accu-Chek Softclix Lancets lancets 1 each by Other route 3 (three) times daily. as directed   ? allopurinol (ZYLOPRIM) 300 MG tablet TAKE 1 TABLET(300 MG) BY MOUTH DAILY   ? azelastine (ASTELIN) 0.1 % nasal spray 1-2 puffs each nostril at bedtime as needed   ? b complex vitamins capsule Take 1 capsule by mouth  daily.   ? blood glucose meter kit and supplies Dispense based on patient and insurance preference. Use up to four times daily as directed. (FOR ICD-10 E10.9, E11.9).   ? BREZTRI AEROSPHERE 160-9-4.8 MCG/ACT AERO USE 2 INHALATIONS BY MOUTH IN  THE MORNING AND AT BEDTIME   ? cholecalciferol (VITAMIN D3) 25 MCG (1000 UNIT) tablet Take 1,000 Units by mouth daily.   ? cloNIDine (CATAPRES) 0.3 MG tablet Take 1 tablet (0.3 mg total) by mouth 2 (two) times daily.   ? Continuous Blood Gluc Receiver (FREESTYLE LIBRE 14 DAY READER) DEVI Use as directed.   ? Continuous Blood Gluc Sensor (FREESTYLE LIBRE 14 DAY SENSOR) MISC Use as directed to check blood sugar daily   ? diltiazem (CARDIZEM) 30 MG tablet Take 1 tablet (30 mg total) by mouth See admin instructions. Take one tablet every 4 hours as needed for heart rate greater than 100   ? dronedarone (MULTAQ) 400 MG tablet Take 1 tablet (400 mg total) by mouth 2 (two) times daily.   ? ELIQUIS 5 MG TABS tablet TAKE 1 TABLET(5 MG) BY MOUTH TWICE DAILY   ? Insulin Lispro Prot & Lispro (HUMALOG MIX 75/25 KWIKPEN) (75-25) 100 UNIT/ML Kwikpen Inject 25 Units into the skin 2 (two) times daily. (Patient taking differently: Inject 15-20 Units into the skin 2 (two) times daily.) 11/27/2021: 11/27/21- patient reports consistently taking 15-17 Units BID; adjusts around blood sugars at home-- reports was "too low" with 25 Units  ? ipratropium (ATROVENT HFA) 17 MCG/ACT inhaler Inhale 2 puffs into the lungs every 6 (six) hours as needed for wheezing.   ?  levothyroxine (SYNTHROID) 75 MCG tablet TAKE 1 TABLET BY MOUTH  DAILY BEFORE BREAKFAST   ? lidocaine (LIDODERM) 5 % Place 1 patch onto the skin daily. Remove & Discard patch within 12 hours or as directed by MD   ? metoprolol tartrate (LOPRESSOR) 25 MG tablet Take 1 tablet (25 mg total) by mouth 2 (two) times daily.   ? NOVOFINE PLUS PEN NEEDLE 32G X 4 MM MISC USE TWICE DAILY AS DIRECTED (Patient taking differently: 1 each by Other route as  directed.)   ? ONETOUCH VERIO test strip 1 each by Other route 3 (three) times daily.   ? potassium chloride (KLOR-CON) 10 MEQ tablet Take 4 tablets (40 mEq total) by mouth daily.   ? RESTASIS MULTIDOSE 0.05 % ophthalmic emulsion Place 1 drop into both eyes 2 (two) times daily.    ? rosuvastatin (CRESTOR) 10 MG tablet TAKE 1 TABLET BY MOUTH IN  THE EVENING   ? Semaglutide, 1 MG/DOSE, (OZEMPIC, 1 MG/DOSE,) 4 MG/3ML SOPN Inject 1 mg into the skin once a week. (Patient taking differently: Inject 1 mg into the skin every Saturday.)   ? torsemide (DEMADEX) 20 MG tablet Take 1 tablet (20 mg total) by mouth daily.   ? triamcinolone cream (KENALOG) 0.1 % Apply 1 application topically 2 (two) times daily.   ? verapamil (CALAN) 120 MG tablet Take 1 tablet (120 mg total) by mouth in the morning.   ? vitamin B-12 (CYANOCOBALAMIN) 1000 MCG tablet Take 1,000 mcg by mouth daily.   ? ?No facility-administered encounter medications on file as of 01/07/2022.  ? ?Patient Active Problem List  ? Diagnosis Date Noted  ? Asthma 11/01/2021  ? Normocytic anemia 10/11/2021  ? Nodule of right lung 09/30/2021  ? Pneumonia 07/17/2021  ? Dysthymia 03/12/2021  ? Dysuria 03/01/2021  ? Primary osteoarthritis involving multiple joints 03/01/2021  ? Recurrent falls 01/10/2021  ? Sensorineural hearing loss (SNHL), 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  ? Persistent atrial fibrillation ([place]) 08/12/2020  ? Type 2 diabetes mellitus with hyperglycemia, with long-term current use of insulin ([place]) 06/21/2020  ? Type 2 diabetes mellitus with diabetic polyneuropathy, with long-term current use of insulin ([place]) 06/21/2020  ? Mixed hyperlipidemia 06/21/2020  ? Uncontrolled hypertension 06/11/2020  ? Aortic atherosclerosis ([place]) 03/28/2020  ? Diabetic peripheral neuropathy ([place]) 12/26/2019  ? Gastroesophageal reflux disease without esophagitis 03/17/2019  ? Bilateral leg edema 02/16/2019  ? Varicose veins of both legs with edema 02/16/2019  ? Heart murmur 09/05/2018  ?  Restrictive lung disease 02/22/2018  ? Dysgeusia 06/11/2017  ? Osteoarthritis of left knee 06/03/2017  ? NAFLD (nonalcoholic fatty liver disease) 08/07/2016  ? Osteoporosis 12/31/2015  ? Obesity 12/19/2015  ? Edema 10/18/2015  ? Acquired hypothyroidism 10/18/2015  ? Prolonged QT interval 10/03/2013  ? Long term (current) use of anticoagulants 06/09/2012  ? BENIGN NEOPLASM OF ADRENAL GLAND 11/25/2010  ? Chronic diastolic heart failure ([place]) 02/20/2010  ? DM 05/16/2009  ? GOUT 05/16/2009  ? OBESITY, MORBID 05/16/2009  ? Hypertension associated with diabetes ([place]) 05/16/2009  ? Paroxysmal atrial fibrillation ([place]) 05/16/2009  ? HYPERLIPIDEMIA 11/30/2008  ? Obstructive sleep apnea on CPAP 11/30/2008  ? ?Conditions to be addressed/monitored:  CHF and DMII ? ?Care Plan : RN Care Manager Plan of Care  ?Updates made by Knox Royalty, RN since 01/07/2022 12:00 AM  ?  ? ?Problem: Chronic Disease Management Needs   ?Priority: High  ?  ? ?Long-Range Goal: Ongoing adherence to established care plan for long term chronic disease management   ?  Start Date: 10/09/2021  ?Expected End Date: 10/09/2022  ?Priority: High  ?Note:   ?Current Barriers:  ?Chronic Disease Management support and education needs related to Atrial Fibrillation, CHF, and DMII ?Fragile state of health, multiple progressing chronic health conditions-- requires ongoing reinforcement/ support for self-health management of multiple co-morbidities ?Recent hospitalization October 11, 2021- October 20, 2021: A-Fib with RVR; reports was at [facility]- patient reports received cardioversion during hospitalization ? ?RNCM Clinical Goal(s):  ?Patient will demonstrate ongoing health management independence as evidenced by adherence to established plan of care for CHF/ AF, and DMII        through collaboration with RN Care manager, provider, and care team.  ? ?Interventions: ?1:1 collaboration with primary care provider regarding development and update of comprehensive plan  of care as evidenced by provider attestation and co-signature ?Inter-disciplinary care team collaboration (see longitudinal plan of care) ?Evaluation of current treatment plan related to  self management and patient's

## 2022-01-07 NOTE — Patient Instructions (Signed)
Visit Information ? ?[name], thank you for taking time to talk with me today. Please don't hesitate to contact me if I can be of assistance to you before our next scheduled telephone appointment ? ?Below are the goals we discussed today:  ?Patient Self-Care Activities: ?Patient Oval will: ?Take medications as prescribed ?Attend all scheduled provider appointments ?Call pharmacy for medication refills ?Call provider office for new concerns or questions ?Continue to check fasting (first thing in the morning, before eating) and 2-hours AFTER eating blood sugars at home-- this will help your doctors know if your medications are working or if they need adjusting: we will review these values each time we talk over the phone  ?Your blood sugar goal for first thing in the morning before eating is between 110-130 ?Your blood sugar goal for 2-hours after eating is between 160-180 ?Continue to monitor and write down on paper your morning weights every day: if you gain more than 3 lbs overnight, or 5 lbs in one week: please call your cardiology provider for instructions as to what you should do ?Watch for swelling in feet, ankles and legs every day ?Wear the compression stockings that the cardiology staff recommended on days that you notice swelling in your feet/ ankles ?Continue to follow heart healthy, low salt, low cholesterol, carbohydrate-modified, low sugar diet ?Try to stay as active as possible, and please take effort to not rush-- rushing will increase your anxiety and may cause you to fall ?Keep up the great work preventing falls-- continue to use your cane ? ?Our next scheduled telephone follow up visit/ appointment is scheduled on: Monday, Feb 17, 2022 at 10:45 am- This is a PHONE [place] appointment ? ?If you need to cancel or re-schedule our visit, please call [phone] and our care guide team will be happy to assist you. ?  ?I look forward to hearing about your progress. ?  ?[name], RN, BSN, CCRN  Alumnus ?[place] ?([phone]: direct office ? ?If you are experiencing a Mental Health or [place] or need someone to talk to, please  ?call the Suicide and Crisis Lifeline: 988 ?call the [place] National Suicide Prevention Lifeline: 930 778 7490 or TTY: 6604078087 TTY [phone]) to talk to a trained counselor ?call 1-800-273-TALK (toll free, 24 hour hotline) ?go to [facility] [address], [place] [phone]) ?call 911  ? ?The patient verbalized understanding of instructions, educational materials, and care plan provided today and agreed to receive a mailed copy of patient instructions, educational materials, and care plan ? ?Shortness of Breath, Adult ?Shortness of breath is when a person has trouble breathing or when a person feels like she or he is having trouble breathing in enough air. Shortness of breath could be a sign of a medical problem. ?Follow these instructions at home: ?Pollutants ?Do not use any products that contain nicotine or tobacco. These products include cigarettes, chewing tobacco, and vaping devices, such as e-cigarettes. This also includes cigars and pipes. If you need help quitting, ask your health care provider. ?Avoid things that can irritate your airways, including: ?Smoke. This includes campfire smoke, forest fire smoke, and secondhand smoke from tobacco products. Do not smoke or allow others to smoke in your home. ?Mold. ?Dust. ?Air pollution. ?Chemical fumes. ?Things that can give you an allergic reaction (allergens) if you have allergies. Common allergens include pollen from grasses or trees and animal dander. ?Keep your living space clean and free of  mold and dust. ?General instructions ?Pay attention to any changes in your symptoms. ?Take over-the-counter and prescription medicines only as told by your health care provider. This includes oxygen therapy and inhaled  medicines. ?Rest as needed. ?Return to your normal activities as told by your health care provider. Ask your health care provider what activities are safe for you. ?Keep all follow-up visits. This is important. ?Contact a health care provider if: ?Your condition does not improve as soon as expected. ?You have a hard time doing your normal activities, even after you rest. ?You have new symptoms. ?You cannot walk up stairs or exercise the way that you normally do. ?Get help right away if: ?Your shortness of breath gets worse. ?You have shortness of breath when you are resting. ?You feel light-headed or you faint. ?You have a cough that is not controlled with medicines. ?You cough up blood. ?You have pain with breathing. ?You have pain in your chest, arms, shoulders, or abdomen. ?You have a fever. ?These symptoms may be an emergency. Get help right away. Call 911. ?Do not wait to see if the symptoms will go away. ?Do not drive yourself to the hospital. ?Summary ?Shortness of breath is when a person has trouble breathing enough air. It can be a sign of a medical problem. ?Avoid things that irritate your lungs, such as smoking, pollution, mold, and dust. ?Pay attention to changes in your symptoms and contact your health care provider if you have a hard time completing daily activities because of shortness of breath. ?This information is not intended to replace advice given to you by your health care provider. Make sure you discuss any questions you have with your health care provider. ?Document Revised: 05/25/2021 Document Reviewed: 05/25/2021 ?Elsevier Patient Education ? 2022 [place]. ? ? ?

## 2022-01-13 ENCOUNTER — Encounter: Payer: Self-pay | Admitting: Podiatry

## 2022-01-13 ENCOUNTER — Ambulatory Visit (INDEPENDENT_AMBULATORY_CARE_PROVIDER_SITE_OTHER): Payer: Medicare Other | Admitting: Podiatry

## 2022-01-13 ENCOUNTER — Other Ambulatory Visit: Payer: Self-pay

## 2022-01-13 DIAGNOSIS — M79675 Pain in left toe(s): Secondary | ICD-10-CM

## 2022-01-13 DIAGNOSIS — E1142 Type 2 diabetes mellitus with diabetic polyneuropathy: Secondary | ICD-10-CM | POA: Diagnosis not present

## 2022-01-13 DIAGNOSIS — Z794 Long term (current) use of insulin: Secondary | ICD-10-CM

## 2022-01-13 DIAGNOSIS — M79674 Pain in right toe(s): Secondary | ICD-10-CM | POA: Diagnosis not present

## 2022-01-13 DIAGNOSIS — B351 Tinea unguium: Secondary | ICD-10-CM | POA: Diagnosis not present

## 2022-01-17 DIAGNOSIS — E1165 Type 2 diabetes mellitus with hyperglycemia: Secondary | ICD-10-CM

## 2022-01-17 DIAGNOSIS — Z794 Long term (current) use of insulin: Secondary | ICD-10-CM | POA: Diagnosis not present

## 2022-01-17 DIAGNOSIS — I5032 Chronic diastolic (congestive) heart failure: Secondary | ICD-10-CM | POA: Diagnosis not present

## 2022-01-18 DIAGNOSIS — E1142 Type 2 diabetes mellitus with diabetic polyneuropathy: Secondary | ICD-10-CM | POA: Diagnosis not present

## 2022-01-18 DIAGNOSIS — Z794 Long term (current) use of insulin: Secondary | ICD-10-CM | POA: Diagnosis not present

## 2022-01-18 NOTE — Progress Notes (Signed)
?  Subjective:  ?Patient ID: [name], female    DOB: 12/24/1940,  MRN: [id number] ? ?[name] presents to clinic today for at risk foot care with history of diabetic neuropathy and painful elongated mycotic toenails 1-5 bilaterally which are tender when wearing enclosed shoe gear. Pain is relieved with periodic professional debridement. ? ?Patient states blood glucose was 95 mg/dl today.  Last HgA1c was 8.6%. ? ?PCP is Horald Pollen, MD , and last visit was October 30, 2021. ? ?Allergies  ?Allergen Reactions  ? Doxycycline   ?  Made patient feel very ill   ? Metformin Diarrhea  ? ?Review of Systems: Negative except as noted in the HPI. ? ?General: Patient is a pleasant 81 y.o. female morbidly obese in NAD. AAO x 3.  ? ?Neurovascular Examination: ?CFT immediate b/l LE. Palpable DP/PT pulses b/l LE. Digital hair present b/l. Skin temperature gradient WNL b/l. No pain with calf compression b/l. No edema noted b/l. No cyanosis or clubbing noted b/l LE. ? ?Pt has subjective symptoms of neuropathy. Protective sensation intact 5/5 intact bilaterally with 10g monofilament b/l. Vibratory sensation intact b/l. ? ?Dermatological:  ?Pedal integument with normal turgor, texture and tone b/l LE. No open wounds b/l. No interdigital macerations b/l. Toenails 1-5 b/l elongated, thickened, discolored with subungual debris. +Tenderness with dorsal palpation of nailplates. No hyperkeratotic or porokeratotic lesions present. ? ?Musculoskeletal:  ?Muscle strength 4/5 to all lower extremity muscle groups bilaterally. Using office transport chair on today's visit. No pain, crepitus or joint limitation noted with ROM bilateral LE. HAV with bunion deformity noted b/l LE. Hammertoe deformity noted 2-5 b/l. ? ?  11/14/2021  ? 12:00 AM 08/29/2021  ? 12:11 PM 05/24/2021  ? 10:04 AM  ?Hemoglobin A1C  ?Hemoglobin-A1c 8.3      8.6   7.8    ?  ? This result is from an external source.  ? ?Assessment/Plan: ?1. Pain due to  onychomycosis of toenails of both feet   ?2. Type 2 diabetes mellitus with diabetic polyneuropathy, with long-term current use of insulin ([place])   ?-Consent given for treatment as described below: ?-No new findings. No new orders. ?-Toenails 1-5 b/l were debrided in length and girth with sterile nail nippers and dremel without iatrogenic bleeding.  ?-Patient/POA to call should there be question/concern in the interim.  ? ?Return in about 3 months (around 04/15/2022). ? ?Marzetta Board, DPM  ?

## 2022-01-23 ENCOUNTER — Ambulatory Visit: Payer: Medicare Other | Admitting: Internal Medicine

## 2022-01-27 ENCOUNTER — Telehealth: Payer: Self-pay | Admitting: Internal Medicine

## 2022-01-27 ENCOUNTER — Other Ambulatory Visit: Payer: Self-pay | Admitting: Emergency Medicine

## 2022-01-27 ENCOUNTER — Ambulatory Visit: Payer: Medicare Other | Admitting: Podiatry

## 2022-01-27 DIAGNOSIS — G4733 Obstructive sleep apnea (adult) (pediatric): Secondary | ICD-10-CM

## 2022-01-27 DIAGNOSIS — G4734 Idiopathic sleep related nonobstructive alveolar hypoventilation: Secondary | ICD-10-CM

## 2022-01-27 DIAGNOSIS — I152 Hypertension secondary to endocrine disorders: Secondary | ICD-10-CM

## 2022-01-27 NOTE — Telephone Encounter (Signed)
Called and spoke with patient. She verbalized understanding. Will place order for ONO and cpap pressure change.  ? ?Nothing further needed at time of call.  ?

## 2022-01-27 NOTE — Telephone Encounter (Signed)
Order- DME [facility]- please reduce autopap to 5-15 ?Patient needs to understand that she has to get the machine to [facility] for this to happen.  ?Please get the overnight oximetry study done as ordered.  ?Thanks. ?

## 2022-01-27 NOTE — Telephone Encounter (Signed)
Called and spoke with patient. She stated that she has been struggling with her cpap machine for the past few weeks. Per her spouse she has been snoring even with the cpap machine on. She feels like the pressure is set too high. She feels like the increased SOB is associated with the cpap machine issues.  ? ?She was also calling to check on the status of the ONO. She has not heard anything from [employer]. I advised her that I would call to see if they have the order and can call her ASAP. Danielle from [employer] documented that we received the order on 03/28. I called [employer] and spoke with Chasity. She stated that they have not received the order. I will see if the PCCs can see the order again.  ? ?I attempted to get a download from [facility] but the data has not been updated since 01/2021.  ? ?Dr. Annamaria Boots, can you please advise? Thanks!  ? ? ?

## 2022-01-27 NOTE — Telephone Encounter (Signed)
Pt states at 3/16 appt, CY said he'd adjust cpap settings. Pt states for the last week- she has been more sob and not sleeping well. Pt states her cpap feels like there's a lot more air. Pt spouse states she has been snoring more. Pt states when she is sob, she takes her cpap off and falls asleep before putting back on. States sob has been so bad, she almost called EMS.Pt states CY was going to place a referral to see if O2 therapy is neccesary. Please advise ?[phone] ?

## 2022-01-27 NOTE — Telephone Encounter (Signed)
Note not needed made appt ?

## 2022-01-28 ENCOUNTER — Ambulatory Visit (INDEPENDENT_AMBULATORY_CARE_PROVIDER_SITE_OTHER): Payer: Medicare Other | Admitting: Internal Medicine

## 2022-01-28 ENCOUNTER — Encounter: Payer: Self-pay | Admitting: Internal Medicine

## 2022-01-28 VITALS — BP 130/80 | HR 61 | Temp 97.9°F | Ht 61.0 in | Wt 242.0 lb

## 2022-01-28 DIAGNOSIS — E1165 Type 2 diabetes mellitus with hyperglycemia: Secondary | ICD-10-CM

## 2022-01-28 DIAGNOSIS — Z794 Long term (current) use of insulin: Secondary | ICD-10-CM

## 2022-01-28 DIAGNOSIS — M79601 Pain in right arm: Secondary | ICD-10-CM | POA: Diagnosis not present

## 2022-01-28 DIAGNOSIS — L989 Disorder of the skin and subcutaneous tissue, unspecified: Secondary | ICD-10-CM | POA: Diagnosis not present

## 2022-01-28 NOTE — Progress Notes (Signed)
Patient ID: [name], female   DOB: 01/21/1941, 81 y.o.   MRN: [id number] ? ? ? ?    Chief Complaint: follow up arm pain ? ?     HPI:  [name] is a 81 y.o. female here with c/o 3 days onset sudden pain severe localized to a red swelling area to the post right mid arm below the elbow; started after spending some time sitting in wheelchair several hours with arms resting the sink doing dishes.  Has also a very small blood blister near the site but no trauma she is awae.  Also incidentally has 3 mo onset dark skin lesion some raised and not painful, but not resolving as well.  Pt denies chest pain, increased sob or doe, wheezing, orthopnea, PND, increased LE swelling, palpitations, dizziness or syncope.   Pt denies polydipsia, polyuria, or new focal neuro s/s.    Pt denies fever, wt loss, night sweats, loss of appetite, or other constitutional symptoms  Coincidently has derm appt soon ?      ?Wt Readings from Last 3 Encounters:  ?01/28/22 242 lb (109.8 kg)  ?01/02/22 242 lb (109.8 kg)  ?12/13/21 240 lb (108.9 kg)  ? ?BP Readings from Last 3 Encounters:  ?01/28/22 130/80  ?01/02/22 130/88  ?12/13/21 118/80  ? ?      ?Past Medical History:  ?Diagnosis Date  ? Arthritis   ? Back pain   ? Chronic anticoagulation   ? due to aflutter  ? Chronic kidney disease   ? Diabetes mellitus   ? Diastolic CHF, chronic (HCC)   ? a.  echo 2006 - ef 55-65%; mild diast dysfxn;    b. Echo 08/2011: Mild LVH, EF 60%;  c. 04/2013 Echo: EF 65-69%, mild conc LVH;  08/2014 Echo: EF 60-65%, mild-mod MR.  ? Gout   ? Hyperlipidemia   ? Hypertension   ? a.  Renal arterial Dopplers 12/2011: 1-59% right renal artery stenosis  ? Morbid obesity ([place])   ? Obstructive sleep apnea on CPAP   ? Paroxysmal Afib/Flutter   ? a. 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  ? ?Past Surgical History:  ?Procedure Laterality Date  ? APPENDECTOMY    ? ATRIAL FLUTTER ABLATION N/A 09/24/2011  ? Procedure: ATRIAL FLUTTER ABLATION;  Surgeon: [name], MD;   Location[facility] LAB;  Service: Cardiovascular;  Laterality: N/A;  ? CARDIOVERSION  10/22/2011  ? Procedure: CARDIOVERSION;  Surgeon: [name], MD;  Location: [facility];  Service: Cardiovascular;  Laterality: N/A;  ? CARDIOVERSION N/A 09/10/2011  ? Procedure: CARDIOVERSION;  Surgeon: [name], MD;  Location: [facility] CATH LAB;  Service: Cardiovascular;  Laterality: N/A;  ? CHOLECYSTECTOMY    ? COLONOSCOPY WITH PROPOFOL N/A 06/13/2021  ? Procedure: COLONOSCOPY WITH PROPOFOL;  Surgeon: Milus Banister, MD;  Location: [facility] ENDOSCOPY;  Service: Endoscopy;  Laterality: N/A;  ? POLYPECTOMY  06/13/2021  ? Procedure: POLYPECTOMY;  Surgeon: Milus Banister, MD;  Location: [facility];  Service: Endoscopy;;  ? TONSILLECTOMY  1982  ? TOTAL ABDOMINAL HYSTERECTOMY    ? ? reports that she has never smoked. She has never used smokeless tobacco. She reports that she does not drink alcohol and does not use drugs. ?family history includes Breast cancer in her sister; Cancer in her sister; Heart disease in her father; Hypertension in her father. ?Allergies  ?Allergen Reactions  ? Doxycycline   ?  Made patient feel very ill   ? Metformin Diarrhea  ? ?Current Outpatient  Medications on File Prior to Visit  ?Medication Sig Dispense Refill  ? Accu-Chek Softclix Lancets lancets 1 each by Other route 3 (three) times daily. as directed 100 each 3  ? allopurinol (ZYLOPRIM) 300 MG tablet TAKE 1 TABLET(300 MG) BY MOUTH DAILY 90 tablet 3  ? azelastine (ASTELIN) 0.1 % nasal spray 1-2 puffs each nostril at bedtime as needed 30 mL 12  ? b complex vitamins capsule Take 1 capsule by mouth daily.    ? blood glucose meter kit and supplies Dispense based on patient and insurance preference. Use up to four times daily as directed. (FOR ICD-10 E10.9, E11.9). 1 each 0  ? BREZTRI AEROSPHERE 160-9-4.8 MCG/ACT AERO USE 2 INHALATIONS BY MOUTH IN  THE MORNING AND AT BEDTIME 21.4 g 5  ? cholecalciferol (VITAMIN D3) 25 MCG (1000 UNIT) tablet Take 1,000 Units by  mouth daily.    ? cloNIDine (CATAPRES) 0.3 MG tablet TAKE 1 TABLET BY MOUTH  TWICE DAILY 180 tablet 3  ? Continuous Blood Gluc Receiver (FREESTYLE LIBRE 14 DAY READER) DEVI Use as directed. 1 each 0  ? Continuous Blood Gluc Sensor (FREESTYLE LIBRE 14 DAY SENSOR) MISC Use as directed to check blood sugar daily 2 each 3  ? diltiazem (CARDIZEM) 30 MG tablet Take 1 tablet (30 mg total) by mouth See admin instructions. Take one tablet every 4 hours as needed for heart rate greater than 100 45 tablet 2  ? dronedarone (MULTAQ) 400 MG tablet Take 1 tablet (400 mg total) by mouth 2 (two) times daily. 180 tablet 3  ? ELIQUIS 5 MG TABS tablet TAKE 1 TABLET(5 MG) BY MOUTH TWICE DAILY 180 tablet 1  ? Insulin Lispro Prot & Lispro (HUMALOG MIX 75/25 KWIKPEN) (75-25) 100 UNIT/ML Kwikpen Inject 25 Units into the skin 2 (two) times daily. (Patient taking differently: Inject 15-20 Units into the skin 2 (two) times daily.) 45 mL 4  ? ipratropium (ATROVENT HFA) 17 MCG/ACT inhaler Inhale 2 puffs into the lungs every 6 (six) hours as needed for wheezing. 1 each 2  ? ketorolac (ACULAR) 0.5 % ophthalmic solution SMARTSIG:In Eye(s)    ? levothyroxine (SYNTHROID) 75 MCG tablet TAKE 1 TABLET BY MOUTH  DAILY BEFORE BREAKFAST 90 tablet 3  ? lidocaine (LIDODERM) 5 % Place 1 patch onto the skin daily. Remove & Discard patch within 12 hours or as directed by MD 30 patch 0  ? lisinopril (ZESTRIL) 20 MG tablet Take 20 mg by mouth daily.    ? losartan (COZAAR) 100 MG tablet Take 100 mg by mouth daily.    ? metoprolol tartrate (LOPRESSOR) 25 MG tablet Take 1 tablet (25 mg total) by mouth 2 (two) times daily. 180 tablet 3  ? NOVOFINE PLUS PEN NEEDLE 32G X 4 MM MISC USE TWICE DAILY AS DIRECTED (Patient taking differently: 1 each by Other route as directed.) 100 each 11  ? ONETOUCH VERIO test strip 1 each by Other route 3 (three) times daily.    ? predniSONE (DELTASONE) 20 MG tablet Take 20 mg by mouth daily.    ? RESTASIS MULTIDOSE 0.05 % ophthalmic  emulsion Place 1 drop into both eyes 2 (two) times daily.     ? rosuvastatin (CRESTOR) 10 MG tablet TAKE 1 TABLET BY MOUTH IN  THE EVENING 90 tablet 3  ? Semaglutide, 1 MG/DOSE, (OZEMPIC, 1 MG/DOSE,) 4 MG/3ML SOPN Inject 1 mg into the skin once a week. (Patient taking differently: Inject 1 mg into the skin every Saturday.) 9 mL 3  ?   TOBRADEX ophthalmic ointment SMARTSIG:Sparingly In Eye(s) Every Night    ? torsemide (DEMADEX) 20 MG tablet Take 1 tablet (20 mg total) by mouth daily.    ? triamcinolone cream (KENALOG) 0.1 % Apply 1 application topically 2 (two) times daily. 30 g 0  ? verapamil (CALAN) 120 MG tablet Take 1 tablet (120 mg total) by mouth in the morning. 180 tablet 3  ? vitamin B-12 (CYANOCOBALAMIN) 1000 MCG tablet Take 1,000 mcg by mouth daily.    ? potassium chloride (KLOR-CON) 10 MEQ tablet Take 4 tablets (40 mEq total) by mouth daily. 120 tablet 0  ? ?No current facility-administered medications on file prior to visit.  ? ?     ROS:  All others reviewed and negative. ? ?Objective  ? ?     PE:  BP 130/80 (BP Location: Left Arm, Patient Position: Sitting, Cuff Size: Normal)   Pulse 61   Temp 97.9 ?F (36.6 ?C) (Oral)   Ht 5' 1" (1.549 m)   Wt 242 lb (109.8 kg)   SpO2 94%   BMI 45.73 kg/m?  ? ?              Constitutional: Pt appears in NAD ?              HENT: Head: NCAT.  ?              Right Ear: External ear normal.   ?              Left Ear: External ear normal.  ?              Eyes: . Pupils are equal, round, and reactive to light. Conjunctivae and EOM are normal ?              Nose: without d/c or deformity ?              Neck: Neck supple. Gross normal ROM ?              Cardiovascular: Normal rate and regular rhythm.   ?              Pulmonary/Chest: Effort normal and breath sounds without rales or wheezing.  ?              Abd:  Soft, NT, ND, + BS, no organomegaly ?              Neurological: Pt is alert. At baseline orientation, ?               Right mid post arm with 1.5 cm area marked  tender red raised swelling firm area without red streaks, fluctuance or drainage ?              Skin: LE edema - trace bialteral, right arm just prox to the elbow is 1/2 cm black slight raised lesion irregular edges

## 2022-01-28 NOTE — Patient Instructions (Signed)
You appear to have a reaction from a injury to the right arm tendon called ganglion cyst; ? ?Ok to use heat as needed, but also ok for topical OTC Voltaren gel to help speed the healing process ? ?Please consider Sports Medicine to follow up if not improving ? ?Make sure to see the Dermatology asap for the right elbow dark skin lesion as you have planned ? ?Please continue all other medications as before, and refills have been done if requested. ? ?Please have the pharmacy call with any other refills you may need. ? ?Please keep your appointments with your specialists as you may have planned ? ? ? ?

## 2022-02-02 ENCOUNTER — Encounter: Payer: Self-pay | Admitting: Internal Medicine

## 2022-02-02 DIAGNOSIS — L989 Disorder of the skin and subcutaneous tissue, unspecified: Secondary | ICD-10-CM | POA: Insufficient documentation

## 2022-02-02 DIAGNOSIS — I10 Essential (primary) hypertension: Secondary | ICD-10-CM | POA: Diagnosis not present

## 2022-02-02 DIAGNOSIS — G4733 Obstructive sleep apnea (adult) (pediatric): Secondary | ICD-10-CM | POA: Diagnosis not present

## 2022-02-02 DIAGNOSIS — R0683 Snoring: Secondary | ICD-10-CM | POA: Diagnosis not present

## 2022-02-02 NOTE — Assessment & Plan Note (Signed)
Mid arm localized area, c/w acute onset ganglion cyst like mass - for volt gel prn, . to f/u any worsening symptoms or concerns ?

## 2022-02-02 NOTE — Assessment & Plan Note (Signed)
With some suspicion for melanoma, pt agrees to mention at her upcoming derm appt ?

## 2022-02-02 NOTE — Assessment & Plan Note (Signed)
Lab Results  ?Component Value Date  ? HGBA1C 8.3 11/14/2021  ? ?uncontrolled, pt to continue current medical treatment humalo 75/25 and urged for better compliance with diet ? ?

## 2022-02-03 DIAGNOSIS — L814 Other melanin hyperpigmentation: Secondary | ICD-10-CM | POA: Diagnosis not present

## 2022-02-03 DIAGNOSIS — D2239 Melanocytic nevi of other parts of face: Secondary | ICD-10-CM | POA: Diagnosis not present

## 2022-02-03 DIAGNOSIS — L821 Other seborrheic keratosis: Secondary | ICD-10-CM | POA: Diagnosis not present

## 2022-02-04 ENCOUNTER — Other Ambulatory Visit: Payer: Self-pay | Admitting: Emergency Medicine

## 2022-02-04 DIAGNOSIS — Z1231 Encounter for screening mammogram for malignant neoplasm of breast: Secondary | ICD-10-CM

## 2022-02-07 ENCOUNTER — Ambulatory Visit (INDEPENDENT_AMBULATORY_CARE_PROVIDER_SITE_OTHER): Payer: Medicare Other | Admitting: Internal Medicine

## 2022-02-07 ENCOUNTER — Encounter: Payer: Self-pay | Admitting: Internal Medicine

## 2022-02-07 VITALS — BP 130/72 | HR 73 | Wt 244.0 lb

## 2022-02-07 DIAGNOSIS — E1165 Type 2 diabetes mellitus with hyperglycemia: Secondary | ICD-10-CM

## 2022-02-07 DIAGNOSIS — E1142 Type 2 diabetes mellitus with diabetic polyneuropathy: Secondary | ICD-10-CM | POA: Diagnosis not present

## 2022-02-07 DIAGNOSIS — G4733 Obstructive sleep apnea (adult) (pediatric): Secondary | ICD-10-CM | POA: Diagnosis not present

## 2022-02-07 DIAGNOSIS — R739 Hyperglycemia, unspecified: Secondary | ICD-10-CM

## 2022-02-07 DIAGNOSIS — I10 Essential (primary) hypertension: Secondary | ICD-10-CM | POA: Diagnosis not present

## 2022-02-07 DIAGNOSIS — Z794 Long term (current) use of insulin: Secondary | ICD-10-CM

## 2022-02-07 DIAGNOSIS — D3501 Benign neoplasm of right adrenal gland: Secondary | ICD-10-CM

## 2022-02-07 LAB — BASIC METABOLIC PANEL
BUN: 15 mg/dL (ref 6–23)
CO2: 34 mEq/L — ABNORMAL HIGH (ref 19–32)
Calcium: 8.7 mg/dL (ref 8.4–10.5)
Chloride: 97 mEq/L (ref 96–112)
Creatinine, Ser: 0.76 mg/dL (ref 0.40–1.20)
GFR: 75.1 mL/min (ref >60.00)
Glucose, Bld: 146 mg/dL — ABNORMAL HIGH (ref 70–99)
Potassium: 3.6 mEq/L (ref 3.5–5.1)
Sodium: 140 mEq/L (ref 135–145)

## 2022-02-07 LAB — POCT GLYCOSYLATED HEMOGLOBIN (HGB A1C): Hemoglobin A1C: 7.3 % — AB (ref 4.0–5.6)

## 2022-02-07 MED ORDER — OZEMPIC (2 MG/DOSE) 8 MG/3ML ~~LOC~~ SOPN: 2.0000 mg | PEN_INJECTOR | SUBCUTANEOUS | 3 refills | Status: DC

## 2022-02-07 NOTE — Patient Instructions (Signed)
?-   Decrease Humalog Mix 15 units with Breakfast and 15 units with Supper  ?- Increase Ozempic  2 mg weekly  ? ? ? ? ?HOW TO TREAT LOW BLOOD SUGARS (Blood sugar LESS THAN 70 MG/DL) ?Please follow the RULE OF 15 for the treatment of hypoglycemia treatment (when your (blood sugars are less than 70 mg/dL)  ? ?STEP 1: Take 15 grams of carbohydrates when your blood sugar is low, which includes:  ?3-4 GLUCOSE TABS  OR ?3-4 OZ OF JUICE OR REGULAR SODA OR ?ONE TUBE OF GLUCOSE GEL   ? ?STEP 2: RECHECK blood sugar in 15 MINUTES ?STEP 3: If your blood sugar is still low at the 15 minute recheck --> then, go back to STEP 1 and treat AGAIN with another 15 grams of carbohydrates. ? ? ? ?

## 2022-02-07 NOTE — Progress Notes (Signed)
?Name: [name]  ?Age/ Sex: 81 y.o., female   ?MRN/ DOB: [id number], [birth year]    ? ?PCP: Horald Pollen, MD   ?Reason for Endocrinology Evaluation: Type 2 Diabetes Mellitus  ?Initial Endocrine Consultative Visit: 06/21/2020  ? ? ?PATIENT IDENTIFIER: [name] is a 81 y.o. female with a past medical history of HTN, PAF, CHF, OSA and Dyslipidemia . The patient has followed with Endocrinology clinic since 06/21/2020 for consultative assistance with management of her diabetes. ? ?DIABETIC HISTORY:  ?Ms. Astorino was diagnosed with DM in 2011,she is intolerant to metformin due to diarrhea. Her hemoglobin A1c has ranged from 7.3% in 2011, peaking at 9.5% in 2014. ? ? ?ADRENAL HISTORY: ?She was noted with an incidental finding of right adrenal adenoma ~ 4 cm on CT imaging and 09/2010 during evaluation of abdominal pain.  This has been stable on CT imaging of abdomen 03/2020 ? ?She had normal saliva cortisol 05/2021 ? ? ? ? ?SUBJECTIVE:  ? ?During the last visit (10/04/2021): A1c 8.6 %, adjusted insulin and continued  Ozempic  ? ? ? ? ?Today (02/07/2022): [name] is here for follow-up on diabetes management.  She is accompanied by her best friend.  She checks her blood sugars multiple times a day  times daily, through CGM . The patient has  had hypoglycemic episodes since the last clinic visit, she self reduced  ? ?She is accompanied by her pastor  ?She is S/P cardioversion 10/18/2021 for A.Fib  ?She was seen by her PCP last week for pain, diagnosed with ganglion cyst ?Was seen by podiatry on 01/13/2022 ?Was evaluated by pulmonary 01/02/2022 for COPD and OSA ? ? ? ? ?HOME DIABETES REGIMEN:  ?Humalog Mix 25 units with Breakfast and Supper - self reduced to 17 units with breakfast and Supper  ?Ozempic 1 mg weekly  ? ?  ? ? ?Statin: yes ?ACE-I/ARB: yes ? ? ?CONTINUOUS GLUCOSE MONITORING RECORD INTERPRETATION   ? ?Dates of Recording: 4/8- 02/07/2022 ? ?Sensor description:freestyle libre ? ?Results  statistics: ?  ?CGM use % of time 96  ?Average and SD 170/19.1  ?Time in range    65 %  ?% Time Above 180 34  ?% Time above 250 0  ?% Time Below target 0  ? ? ? ?Glycemic patterns summary: BG's optimal overnight but hyperglycemia is noted during the day  ? ?Hyperglycemic episodes  during the day  ? ?Hypoglycemic episodes occurred N/A ? ?Overnight periods: optimal  ? ? ? ?  ? ? ? ? ?DIABETIC COMPLICATIONS: ?Microvascular complications:  ?Neuropathy ?Denies: CKD, , retinopathy ?Last eye exam: Completed 2022 ?  ?Macrovascular complications:  ?  ?Denies: CAD, PVD, CVA ? ? ? ?HISTORY:  ?Past Medical History:  ?Past Medical History:  ?Diagnosis Date  ? Arthritis   ? Back pain   ? Chronic anticoagulation   ? due to aflutter  ? Chronic kidney disease   ? Diabetes mellitus   ? Diastolic CHF, chronic (HCC)   ? a.  echo 2006 - ef 55-65%; mild diast dysfxn;    b. Echo 08/2011: Mild LVH, EF 60%;  c. 04/2013 Echo: EF 65-69%, mild conc LVH;  08/2014 Echo: EF 60-65%, mild-mod MR.  ? Gout   ? Hyperlipidemia   ? Hypertension   ? a.  Renal arterial Dopplers 12/2011: 1-59% right renal artery stenosis  ? Morbid obesity ([place])   ? Obstructive sleep apnea on CPAP   ? Paroxysmal Afib/Flutter   ? a. dccv: 08/2011 - on  amiodarone/coumadin  ? ?Past Surgical History:  ?Past Surgical History:  ?Procedure Laterality Date  ? APPENDECTOMY    ? ATRIAL FLUTTER ABLATION N/A 09/24/2011  ? Procedure: ATRIAL FLUTTER ABLATION;  Surgeon: [name], MD;  Location: [facility] CATH LAB;  Service: Cardiovascular;  Laterality: N/A;  ? CARDIOVERSION  10/22/2011  ? Procedure: CARDIOVERSION;  Surgeon: [name], MD;  Location: [place];  Service: Cardiovascular;  Laterality: N/A;  ? CARDIOVERSION N/A 09/10/2011  ? Procedure: CARDIOVERSION;  Surgeon: [name], MD;  Location: [facility] CATH LAB;  Service: Cardiovascular;  Laterality: N/A;  ? CHOLECYSTECTOMY    ? COLONOSCOPY WITH PROPOFOL N/A 06/13/2021  ? Procedure: COLONOSCOPY WITH PROPOFOL;  Surgeon: Milus Banister, MD;   Location: [facility] ENDOSCOPY;  Service: Endoscopy;  Laterality: N/A;  ? POLYPECTOMY  06/13/2021  ? Procedure: POLYPECTOMY;  Surgeon: Milus Banister, MD;  Location: [facility]OS[facility];  Service: Endoscopy;;  ? TONSILLECTOMY  1982  ? TOTAL ABDOMINAL HYSTERECTOMY    ? ?Social History:  reports that she has never smoked. She has never used smokeless tobacco. She reports that she does not drink alcohol and does not use drugs. ?Family History:  ?Family History  ?Problem Relation Age of Onset  ? Heart disease Father   ? Hypertension Father   ? Breast cancer Sister   ? Cancer Sister   ?     breast  ? Colon cancer Neg Hx   ? Esophageal cancer Neg Hx   ? Pancreatic cancer Neg Hx   ? Liver disease Neg Hx   ? ? ? ?HOME MEDICATIONS: ?Allergies as of 02/07/2022   ? ?   Reactions  ? Doxycycline   ? Made patient feel very ill   ? Metformin Diarrhea  ? ?  ? ?  ?Medication List  ?  ? ?  ? Accurate as of February 07, 2022 10:33 AM. If you have any questions, ask your nurse or doctor.  ?  ?  ? ?  ? ?STOP taking these medications   ? ?Ozempic (1 MG/DOSE) 4 MG/3ML Sopn ?Generic drug: Semaglutide (1 MG/DOSE) ?Replaced by: Ozempic (2 MG/DOSE) 8 MG/3ML Sopn ?Stopped by: Dorita Sciara, MD ?  ? ?  ? ?TAKE these medications   ? ?Accu-Chek Softclix Lancets lancets ?1 each by Other route 3 (three) times daily. as directed ?  ?allopurinol 300 MG tablet ?Commonly known as: ZYLOPRIM ?TAKE 1 TABLET(300 MG) BY MOUTH DAILY ?  ?azelastine 0.1 % nasal spray ?Commonly known as: ASTELIN ?1-2 puffs each nostril at bedtime as needed ?  ?b complex vitamins capsule ?Take 1 capsule by mouth daily. ?  ?blood glucose meter kit and supplies ?Dispense based on patient and insurance preference. Use up to four times daily as directed. (FOR ICD-10 E10.9, E11.9). ?  ?Breztri Aerosphere 160-9-4.8 MCG/ACT Aero ?Generic drug: Budeson-Glycopyrrol-Formoterol ?USE 2 INHALATIONS BY MOUTH IN  THE MORNING AND AT BEDTIME ?  ?cholecalciferol 25 MCG (1000 UNIT) tablet ?Commonly known  as: VITAMIN D3 ?Take 1,000 Units by mouth daily. ?  ?cloNIDine 0.3 MG tablet ?Commonly known as: CATAPRES ?TAKE 1 TABLET BY MOUTH  TWICE DAILY ?  ?diltiazem 30 MG tablet ?Commonly known as: CARDIZEM ?Take 1 tablet (30 mg total) by mouth See admin instructions. Take one tablet every 4 hours as needed for heart rate greater than 100 ?  ?dronedarone 400 MG tablet ?Commonly known as: MULTAQ ?Take 1 tablet (400 mg total) by mouth 2 (two) times daily. ?  ?Eliquis 5 MG Tabs  tablet ?Generic drug: apixaban ?TAKE 1 TABLET(5 MG) BY MOUTH TWICE DAILY ?  ?FreeStyle Libre 14 Day Reader Kerrin Mo ?Use as directed. ?  ?FreeStyle Libre 14 Day Sensor Misc ?Use as directed to check blood sugar daily ?  ?Insulin Lispro Prot & Lispro (75-25) 100 UNIT/ML Kwikpen ?Commonly known as: HumaLOG Mix 75/25 KwikPen ?Inject 25 Units into the skin 2 (two) times daily. ?What changed: how much to take ?  ?ipratropium 17 MCG/ACT inhaler ?Commonly known as: ATROVENT HFA ?Inhale 2 puffs into the lungs every 6 (six) hours as needed for wheezing. ?  ?ketorolac 0.5 % ophthalmic solution ?Commonly known as: ACULAR ?SMARTSIG:In Eye(s) ?  ?levothyroxine 75 MCG tablet ?Commonly known as: SYNTHROID ?TAKE 1 TABLET BY MOUTH  DAILY BEFORE BREAKFAST ?  ?lidocaine 5 % ?Commonly known as: Lidoderm ?Place 1 patch onto the skin daily. Remove & Discard patch within 12 hours or as directed by MD ?  ?lisinopril 20 MG tablet ?Commonly known as: ZESTRIL ?Take 20 mg by mouth daily. ?  ?losartan 100 MG tablet ?Commonly known as: COZAAR ?Take 100 mg by mouth daily. ?  ?metoprolol tartrate 25 MG tablet ?Commonly known as: LOPRESSOR ?Take 1 tablet (25 mg total) by mouth 2 (two) times daily. ?  ?NovoFine Plus Pen Needle 32G X 4 MM Misc ?Generic drug: Insulin Pen Needle ?USE TWICE DAILY AS DIRECTED ?What changed:  ?how much to take ?how to take this ?when to take this ?additional instructions ?  ?OneTouch Verio test strip ?Generic drug: glucose blood ?1 each by Other route 3 (three)  times daily. ?  ?Ozempic (2 MG/DOSE) 8 MG/3ML Sopn ?Generic drug: Semaglutide (2 MG/DOSE) ?Inject 2 mg into the skin once a week. ?Replaces: Ozempic (1 MG/DOSE) 4 MG/3ML Sopn ?Started by: Dorita Sciara, M

## 2022-02-11 ENCOUNTER — Encounter: Payer: Self-pay | Admitting: Emergency Medicine

## 2022-02-11 ENCOUNTER — Ambulatory Visit (INDEPENDENT_AMBULATORY_CARE_PROVIDER_SITE_OTHER): Payer: Medicare Other | Admitting: Emergency Medicine

## 2022-02-11 VITALS — BP 132/76 | HR 69 | Temp 97.8°F | Ht 61.0 in | Wt 243.0 lb

## 2022-02-11 DIAGNOSIS — I5032 Chronic diastolic (congestive) heart failure: Secondary | ICD-10-CM | POA: Diagnosis not present

## 2022-02-11 DIAGNOSIS — E039 Hypothyroidism, unspecified: Secondary | ICD-10-CM | POA: Diagnosis not present

## 2022-02-11 DIAGNOSIS — E1159 Type 2 diabetes mellitus with other circulatory complications: Secondary | ICD-10-CM

## 2022-02-11 DIAGNOSIS — E1142 Type 2 diabetes mellitus with diabetic polyneuropathy: Secondary | ICD-10-CM

## 2022-02-11 DIAGNOSIS — I152 Hypertension secondary to endocrine disorders: Secondary | ICD-10-CM

## 2022-02-11 DIAGNOSIS — J849 Interstitial pulmonary disease, unspecified: Secondary | ICD-10-CM

## 2022-02-11 DIAGNOSIS — I2510 Atherosclerotic heart disease of native coronary artery without angina pectoris: Secondary | ICD-10-CM | POA: Diagnosis not present

## 2022-02-11 DIAGNOSIS — I480 Paroxysmal atrial fibrillation: Secondary | ICD-10-CM | POA: Diagnosis not present

## 2022-02-11 DIAGNOSIS — F341 Dysthymic disorder: Secondary | ICD-10-CM

## 2022-02-11 DIAGNOSIS — Z7901 Long term (current) use of anticoagulants: Secondary | ICD-10-CM

## 2022-02-11 DIAGNOSIS — D3501 Benign neoplasm of right adrenal gland: Secondary | ICD-10-CM

## 2022-02-11 DIAGNOSIS — E1165 Type 2 diabetes mellitus with hyperglycemia: Secondary | ICD-10-CM | POA: Diagnosis not present

## 2022-02-11 DIAGNOSIS — Z794 Long term (current) use of insulin: Secondary | ICD-10-CM

## 2022-02-11 DIAGNOSIS — I700 Atherosclerosis of aorta: Secondary | ICD-10-CM

## 2022-02-11 DIAGNOSIS — J984 Other disorders of lung: Secondary | ICD-10-CM

## 2022-02-11 NOTE — Progress Notes (Signed)
[name] ?81 y.o. ? ? ?Chief Complaint  ?Patient presents with  ? discuss health concerns   ? ? ?HISTORY OF PRESENT ILLNESS: ?This is a 81 y.o. female here for follow-up of chronic medical conditions ?#1 diabetes on insulin pump.  Sees endocrinologist on a regular basis. ?Much improved during last visit ?Lab Results  ?Component Value Date  ? 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  Seen recently by pulmonary doctor.  Will be scheduled for high-resolution CT scan of chest.  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  Was also using Breztri. 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  ?1) Type 2 Diabetes Mellitus, optimally controlled, With Neuropathic  complications - Most recent A1c of 7.3 %. Goal A1c < 7.5 %.   ?  ?-I have praised the patient on improved glycemic control, A1c down from 8.6% ?-She has self reduced insulin due to hypoglycemia ?-We initially discussed changing insulin mix to basal/prandial regimen for better flexibility and adjustments but she just received 15 pens and would like to use those first ?-We will continue insulin mix for now with a reduced dose ?-We will also increase Ozempic as below ? - Intolerant to Metformin  ?  ?  ?MEDICATIONS: ?  ?-Decrease Humalog Mix 15 units with Breakfast and 15 units with Supper  ?-Increase Ozempic 2 mg weekly  ?  ?  ?EDUCATION / INSTRUCTIONS: ?BG monitoring instructions: Patient is instructed to check her blood sugars 3 times a day, before meals . ?Call [facility] if: BG persistently < 70 ?I reviewed the Rule of 15 for the  treatment of hypoglycemia in detail with the patient. Literature supplied. ?  ?  ?  ?2) Diabetic complications:  ?Eye: Does not have known diabetic retinopathy.  ?Neuro/ Feet: Does have known diabetic peripheral neuropathy .  ?Renal: Patient does not have known baseline CKD. She   is on an ACEI/ARB at present. ?  ?  ?3) Right Adrenal Adenoma: ?  ?  ?-She was diagnosed with right adrenal adenoma~4 cm on CT imaging from 2011, this has remained stable based on CT scan of abdomen dated 2021. ?-She had normal saliva cortisol testing 05/2021 ?-We will proceed with aldosterone, renin, and plasma metanephrines ?  ?  ?  ?  ?F/U in 6 months  ?  ?  ?Signed electronically by: ?[name] Nena Jordan, MD ?  ?[facility]  ?[facility] ?[address] ?[place],  [address] ?Phone: [phone] ?FAX: [phone] ? ?Recent office visit with pulmonary doctor assessment and plan as follows: ?Problem: Restrictive lung disease  ?Editor: Deneise Lever, MD (Physician)      ?Prior Versions: 1. Deneise Lever, MD (Physician) at 01/02/2022  2:32 PM - Written  ? 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 ?Plan-high-resolution chest CT in 4 months.  Overnight oximetry on CPAP on room air.  ?  ? ? Assessment & Plan Note by Deneise Lever, MD at 01/02/2022 2:31 PM ? ?Author: Deneise Lever, MD Author Type: Physician Filed: 01/02/2022  2:32 PM  ?Note Status: Written Cosign: Cosign Not Required Encounter Date: 01/02/2022  ?Problem:  Obstructive sleep apnea on CPAP  ?Editor: Deneise Lever, MD (Physician)      ?       ?She blames runny nose for difficulty with CPAP compliance. ?Plan-continue auto 5-20.  Prescription for azelastine nasal spray. ?  ?  ? ? ? Patient Instructions by Deneise Lever, MD at 01/02/2022 1:30 PM ? ?Author: Deneise Lever, MD Author Type: Physician Filed: 01/02/2022  2:00 PM  ?Note Status: Signed Cosign: Cosign  Not Required Encounter Date: 01/02/2022  ?Editor: Deneise Lever, MD (Physician)      ?       ?Ok to use the Atrovent inhaler if you are short of breath- see if it helps    ?  Inhale 2 puffs, up to every 6 hours, if needed ?  ?Order- overnight oximetry on CPAP on room air    dx Nocturnal Hypoxemia ?  ?Order- HRCT chest in 4 months    question amiodarone lung toxicity ?  ?Script sent for azelastine nasal spray   try 1-2 puffs of this in each nostril at bedtime if needed, before you put your CPAP on, to help with runny nose. ?  ?  ? ?  ?  ? ? ?HPI ? ? ?Prior to Admission medications   ?Medication Sig Start Date End Date Taking? Authorizing Provider  ?Accu-Chek Softclix Lancets lancets 1 each by Other route 3 (three) times daily. as directed 03/30/20  Yes Maximiano Coss, NP  ?allopurinol (ZYLOPRIM) 300 MG tablet TAKE 1 TABLET(300 MG) BY MOUTH DAILY 10/18/21  Yes Jaydin Jalomo, Ines Bloomer, MD  ?azelastine (ASTELIN) 0.1 % nasal spray 1-2 puffs each nostril at bedtime as needed 01/02/22  Yes Young, Tarri Fuller D, MD  ?b complex vitamins capsule Take 1 capsule by mouth daily.   Yes [provider]  ?blood glucose meter kit and supplies Dispense based on patient and insurance preference. Use up to four times daily as directed. (FOR ICD-10 E10.9, E11.9). 08/29/21  Yes Cortlandt Capuano, Ines Bloomer, MD  ?BREZTRI AEROSPHERE 160-9-4.8 MCG/ACT AERO USE 2 INHALATIONS BY MOUTH IN  THE MORNING AND AT BEDTIME 11/12/21  Yes Cobb, Karie Schwalbe, NP  ?cholecalciferol (VITAMIN D3) 25 MCG (1000 UNIT) tablet Take 1,000 Units by mouth daily.   Yes [provider]  ?cloNIDine (CATAPRES) 0.3 MG tablet TAKE 1 TABLET BY MOUTH  TWICE DAILY 01/28/22  Yes Ketzia Guzek, Ines Bloomer, MD  ?Continuous Blood Gluc Receiver (FREESTYLE LIBRE 14 DAY READER) DEVI Use as directed. 08/29/21  Yes Joshua Soulier, Ines Bloomer, MD  ?Continuous Blood Gluc Sensor (FREESTYLE LIBRE 14 DAY SENSOR) MISC Use as directed to check blood sugar daily 08/29/21  Yes Mariyah Upshaw, Ines Bloomer, MD  ?diltiazem (CARDIZEM) 30 MG tablet Take 1 tablet (30 mg total) by mouth See admin instructions. Take one tablet every 4 hours as needed for heart rate greater than 100 08/26/21  Yes Sherran Needs, NP  ?dronedarone (MULTAQ) 400 MG tablet Take 1 tablet (400 mg total) by mouth 2 (two) times daily. 12/19/21  Yes [name], MD  ?ELIQUIS 5 MG TABS tablet TAKE 1 TABLET(5 MG) BY MOUTH TWICE DAILY 06/17/21  Yes [name], MD  ?Insulin Lispro Prot & Lispro (HUMALOG MIX 75/25 KWIKPEN) (75-25) 100 UNIT/ML Kwikpen Inject 25 Units into the skin 2 (two) times daily. ?Patient taking differently: Inject 15-20 Units into the skin 2 (two) times daily. 10/04/21  Yes [name], Melanie Crazier, MD  ?ipratropium (ATROVENT HFA) 17 MCG/ACT inhaler Inhale 2 puffs into the lungs every  6 (six) hours as needed for wheezing. 12/17/21 12/17/22 Yes Baird Lyons D, MD  ?ketorolac (ACULAR) 0.5 % ophthalmic solution SMARTSIG:In Eye(s) 08/22/21  Yes [provider]  ?levothyroxine (SYNTHROID) 75 MCG tablet TAKE 1 TABLET BY MOUTH  DAILY BEFORE BREAKFAST 10/24/21  Yes Sharlet Notaro, Ines Bloomer, MD  ?lidocaine (LIDODERM) 5 % Place 1 patch onto the skin daily. Remove & Discard patch within 12 hours or as directed by MD 08/29/20  Yes Mcarthur Rossetti, MD  ?lisinopril (ZESTRIL) 20 MG tablet Take 20 mg by mouth daily. 11/01/21  Yes [provider]  ?losartan (COZAAR) 100 MG tablet Take 100 mg by mouth daily. 12/28/21  Yes [provider]  ?metoprolol tartrate (LOPRESSOR) 25 MG tablet Take 1 tablet (25 mg total) by mouth 2 (two) times daily. 12/11/21 03/11/22 Yes Gail Creekmore, Ines Bloomer, MD  ?NOVOFINE PLUS PEN NEEDLE 32G X 4 MM MISC USE TWICE DAILY AS DIRECTED ?Patient taking differently: 1 each by Other route as directed. 04/08/21  Yes Alexarae Oliva, Ines Bloomer, MD  ?[facility] VERIO test strip 1 each by Other route 3 (three) times daily. 06/08/20  Yes [provider]  ?predniSONE (DELTASONE) 20 MG tablet Take 20  mg by mouth daily. 10/02/21  Yes [provider]  ?RESTASIS MULTIDOSE 0.05 % ophthalmic emulsion Place 1 drop into both eyes 2 (two) times daily.  03/04/20  Yes [provider]  ?rosuvastatin (CR

## 2022-02-11 NOTE — Patient Instructions (Signed)
Health Maintenance After Age 79 After age 79, you are at a higher risk for certain long-term diseases and infections as well as injuries from falls. Falls are a major cause of broken bones and head injuries in people who are older than age 79. Getting regular preventive care can help to keep you healthy and well. Preventive care includes getting regular testing and making lifestyle changes as recommended by your health care provider. Talk with your health care provider about: Which screenings and tests you should have. A screening is a test that checks for a disease when you have no symptoms. A diet and exercise plan that is right for you. What should I know about screenings and tests to prevent falls? Screening and testing are the best ways to find a health problem early. Early diagnosis and treatment give you the best chance of managing medical conditions that are common after age 79. Certain conditions and lifestyle choices may make you more likely to have a fall. Your health care provider may recommend: Regular vision checks. Poor vision and conditions such as cataracts can make you more likely to have a fall. If you wear glasses, make sure to get your prescription updated if your vision changes. Medicine review. Work with your health care provider to regularly review all of the medicines you are taking, including over-the-counter medicines. Ask your health care provider about any side effects that may make you more likely to have a fall. Tell your health care provider if any medicines that you take make you feel dizzy or sleepy. Strength and balance checks. Your health care provider may recommend certain tests to check your strength and balance while standing, walking, or changing positions. Foot health exam. Foot pain and numbness, as well as not wearing proper footwear, can make you more likely to have a fall. Screenings, including: Osteoporosis screening. Osteoporosis is a condition that causes  the bones to get weaker and break more easily. Blood pressure screening. Blood pressure changes and medicines to control blood pressure can make you feel dizzy. Depression screening. You may be more likely to have a fall if you have a fear of falling, feel depressed, or feel unable to do activities that you used to do. Alcohol use screening. Using too much alcohol can affect your balance and may make you more likely to have a fall. Follow these instructions at home: Lifestyle Do not drink alcohol if: Your health care provider tells you not to drink. If you drink alcohol: Limit how much you have to: 0-1 drink a day for women. 0-2 drinks a day for men. Know how much alcohol is in your drink. In the [place] one drink equals one 12 oz bottle of beer (355 mL), one 5 oz glass of wine (148 mL), or one 1 oz glass of hard liquor (44 mL). Do not use any products that contain nicotine or tobacco. These products include cigarettes, chewing tobacco, and vaping devices, such as e-cigarettes. If you need help quitting, ask your health care provider. Activity  Follow a regular exercise program to stay fit. This will help you maintain your balance. Ask your health care provider what types of exercise are appropriate for you. If you need a cane or walker, use it as recommended by your health care provider. Wear supportive shoes that have nonskid soles. Safety  Remove any tripping hazards, such as rugs, cords, and clutter. Install safety equipment such as grab bars in bathrooms and safety rails on stairs. Keep rooms and walkways   well-lit. General instructions Talk with your health care provider about your risks for falling. Tell your health care provider if: You fall. Be sure to tell your health care provider about all falls, even ones that seem minor. You feel dizzy, tiredness (fatigue), or off-balance. Take over-the-counter and prescription medicines only as told by your health care provider. These include  supplements. Eat a healthy diet and maintain a healthy weight. A healthy diet includes low-fat dairy products, low-fat (lean) meats, and fiber from whole grains, beans, and lots of fruits and vegetables. Stay current with your vaccines. Schedule regular health, dental, and eye exams. Summary Having a healthy lifestyle and getting preventive care can help to protect your health and wellness after age 79. Screening and testing are the best way to find a health problem early and help you avoid having a fall. Early diagnosis and treatment give you the best chance for managing medical conditions that are more common for people who are older than age 79. Falls are a major cause of broken bones and head injuries in people who are older than age 79. Take precautions to prevent a fall at home. Work with your health care provider to learn what changes you can make to improve your health and wellness and to prevent falls. This information is not intended to replace advice given to you by your health care provider. Make sure you discuss any questions you have with your health care provider. Document Revised: 02/25/2021 Document Reviewed: 02/25/2021 Elsevier Patient Education  2023 Elsevier Inc.  

## 2022-02-12 DIAGNOSIS — I2510 Atherosclerotic heart disease of native coronary artery without angina pectoris: Secondary | ICD-10-CM | POA: Insufficient documentation

## 2022-02-12 NOTE — Assessment & Plan Note (Signed)
Possibly secondary to long-term amiodarone use.  Scheduled for high-resolution CT chest.  Continue Atrovent as needed ?Breztri as maintenance ?

## 2022-02-12 NOTE — Assessment & Plan Note (Signed)
Benign.

## 2022-02-12 NOTE — Assessment & Plan Note (Addendum)
On rosuvastatin 10 milligrams daily. ?

## 2022-02-12 NOTE — Assessment & Plan Note (Signed)
Currently euthyroid. ?Lab Results  ?Component Value Date  ? TSH 3.350 03/12/2021  ?Continue Synthroid 75 mcg daily. ? ?

## 2022-02-12 NOTE — Assessment & Plan Note (Signed)
Well-controlled diabetes.  Continue present management as directed by endocrinologist. ?Well-controlled hypertension ?BP Readings from Last 3 Encounters:  ?02/11/22 132/76  ?02/07/22 130/72  ?01/28/22 130/80  ?Continue losartan 100 mg, metoprolol tartrate 50 mg twice a day, Demadex 20 mg, and verapamil 120 mg daily ?Also takes clonidine 0.3 mg as needed. ? ?

## 2022-02-12 NOTE — Assessment & Plan Note (Signed)
Stable.  No chest pain or pressure. ?

## 2022-02-12 NOTE — Assessment & Plan Note (Signed)
Presently in sinus rhythm.  Continue long-term anticoagulation with Eliquis ?Presently on Multaq as directed by cardiologist ?

## 2022-02-13 ENCOUNTER — Telehealth: Payer: Self-pay | Admitting: Internal Medicine

## 2022-02-13 ENCOUNTER — Ambulatory Visit: Payer: Medicare Other | Admitting: Podiatry

## 2022-02-13 DIAGNOSIS — I5032 Chronic diastolic (congestive) heart failure: Secondary | ICD-10-CM

## 2022-02-13 DIAGNOSIS — I4819 Other persistent atrial fibrillation: Secondary | ICD-10-CM

## 2022-02-13 DIAGNOSIS — Z79899 Other long term (current) drug therapy: Secondary | ICD-10-CM

## 2022-02-13 NOTE — Telephone Encounter (Signed)
I called and spoke with the pt and notified of response per Dr Annamaria Boots. Pt verbalized understanding. Nothing further needed.  ?

## 2022-02-13 NOTE — Telephone Encounter (Signed)
States she woke up this morning, around 6:00 am, and went to the bathroom and noticed she "wasn't breathing right" ?Pt reports that she called pulmonology first this morning about this concern. ?Informed them that she used her Atrovent inhaler but no improvement. ?Per her words, "pulmonology told me to call cardiology because if the inhaler didn't help me then it wasn't my lungs" ?Denies CP, edema, wt gain, dizziness/syncope. ?Pt does not feel her heart is out of rhythm, and states she knows when she is having AFib. ?Does not feel like her heart is racing either. ? ?Will forward to Oda Kilts (whom she saw last in February) and RN to review/advise. ?

## 2022-02-13 NOTE — Telephone Encounter (Signed)
Pt c/o Shortness Of Breath: STAT if SOB developed within the last 24 hours or pt is noticeably SOB on the phone ? ?1. Are you currently SOB (can you hear that pt is SOB on the phone)? no ? ?2. How long have you been experiencing SOB? Since about 6am today ? ?3. Are you SOB when sitting or when up moving around? Mainly when she moves around.   ? ?4. Are you currently experiencing any other symptoms? No other symptoms.   ?

## 2022-02-13 NOTE — Telephone Encounter (Signed)
Attempted phone call to pt and left voicemail message to contact RN at [phone]. 

## 2022-02-13 NOTE — Telephone Encounter (Signed)
Spoke with the pt  ?She states having increased SOB over the past 24 hours  ?She denies any cough or wheezing  ?She states she has been using her breztri inhaler and has tried atrovent without relief  ?She seems confused about inhalers  ?She denies any fevers, chills, aches  ? ?Dr Annamaria Boots, please advise, thanks! ? ?. ?Allergies  ?Allergen Reactions  ? Doxycycline   ?  Made patient feel very ill   ? Metformin Diarrhea  ? ? ?

## 2022-02-13 NOTE — Telephone Encounter (Signed)
We know her biggest problem is her congestive heart failure. ?She needs to get seen by cardiology if her inhalers aren't helping. Or go to ER or Urgent Care.  ?

## 2022-02-14 ENCOUNTER — Telehealth: Payer: Self-pay | Admitting: Internal Medicine

## 2022-02-14 ENCOUNTER — Other Ambulatory Visit: Payer: Medicare Other

## 2022-02-14 NOTE — Addendum Note (Signed)
Addended by: Thora Lance on: 02/14/2022 09:26 AM ? ? Modules accepted: Orders ? ?

## 2022-02-14 NOTE — Telephone Encounter (Signed)
Patient is returning call.  °

## 2022-02-14 NOTE — Telephone Encounter (Signed)
Spoke with pt and advised of recommendations as below per Oda Kilts, PA-C.  Pt states she does not need a refill on either medication.  Lab appointment scheduled for 02/19/2022.  Will have scheduler contact pt to schedule f/u with Dr Caryl Comes or PA.  Pt verbalizes understanding and agrees with current plan. ? ?Pt may take 40 of torsemide and 60 meq of potassium x 3 days and assess response, then return to torsemide 20 mg daily and 40 meq of K daily.  ? ?Would check BMET mid next week.  ? ?Would schedule for in office visit with [place] or gen cards if she has a gen card.  ? ?Legrand Como ?Jonni Sanger? [place], PA-C.  ?

## 2022-02-14 NOTE — Telephone Encounter (Signed)
pt is calling in stating'' she was told by md young, that she would be getting a breathing test done as well as two other test, and she hasnt heard anything since. pt states ''I woke up with really sob'' and the inhaler isnt working.''pt also states'' I spoke with the cardiologist and they've increased my water pill'' ?

## 2022-02-14 NOTE — Telephone Encounter (Signed)
Called and spoke with patient who states that she is calling because Dr. Annamaria Boots wanted her to do a breathing test and she has not heard anything about it. Looks like order was placed in March and April. Called and spoke with Andee Poles to ask her about the status of test. She states that she was going to send an email to get an update and would let me know. ?

## 2022-02-15 DIAGNOSIS — Z794 Long term (current) use of insulin: Secondary | ICD-10-CM | POA: Diagnosis not present

## 2022-02-15 DIAGNOSIS — Z743 Need for continuous supervision: Secondary | ICD-10-CM | POA: Diagnosis not present

## 2022-02-15 DIAGNOSIS — J9811 Atelectasis: Secondary | ICD-10-CM | POA: Diagnosis not present

## 2022-02-15 DIAGNOSIS — J9620 Acute and chronic respiratory failure, unspecified whether with hypoxia or hypercapnia: Secondary | ICD-10-CM | POA: Diagnosis not present

## 2022-02-15 DIAGNOSIS — J811 Chronic pulmonary edema: Secondary | ICD-10-CM | POA: Diagnosis not present

## 2022-02-15 DIAGNOSIS — J9602 Acute respiratory failure with hypercapnia: Secondary | ICD-10-CM | POA: Diagnosis not present

## 2022-02-15 DIAGNOSIS — R0689 Other abnormalities of breathing: Secondary | ICD-10-CM | POA: Diagnosis not present

## 2022-02-15 DIAGNOSIS — Z20822 Contact with and (suspected) exposure to covid-19: Secondary | ICD-10-CM | POA: Diagnosis not present

## 2022-02-15 DIAGNOSIS — I700 Atherosclerosis of aorta: Secondary | ICD-10-CM | POA: Diagnosis not present

## 2022-02-15 DIAGNOSIS — D72829 Elevated white blood cell count, unspecified: Secondary | ICD-10-CM | POA: Diagnosis not present

## 2022-02-15 DIAGNOSIS — I5033 Acute on chronic diastolic (congestive) heart failure: Secondary | ICD-10-CM | POA: Diagnosis not present

## 2022-02-15 DIAGNOSIS — R6889 Other general symptoms and signs: Secondary | ICD-10-CM | POA: Diagnosis not present

## 2022-02-15 DIAGNOSIS — I517 Cardiomegaly: Secondary | ICD-10-CM | POA: Diagnosis not present

## 2022-02-15 DIAGNOSIS — E119 Type 2 diabetes mellitus without complications: Secondary | ICD-10-CM | POA: Diagnosis not present

## 2022-02-15 DIAGNOSIS — R069 Unspecified abnormalities of breathing: Secondary | ICD-10-CM | POA: Diagnosis not present

## 2022-02-15 DIAGNOSIS — E1142 Type 2 diabetes mellitus with diabetic polyneuropathy: Secondary | ICD-10-CM | POA: Diagnosis not present

## 2022-02-15 DIAGNOSIS — R0602 Shortness of breath: Secondary | ICD-10-CM | POA: Diagnosis not present

## 2022-02-15 DIAGNOSIS — J9621 Acute and chronic respiratory failure with hypoxia: Secondary | ICD-10-CM | POA: Diagnosis not present

## 2022-02-15 DIAGNOSIS — G4733 Obstructive sleep apnea (adult) (pediatric): Secondary | ICD-10-CM | POA: Diagnosis not present

## 2022-02-15 DIAGNOSIS — Z7901 Long term (current) use of anticoagulants: Secondary | ICD-10-CM | POA: Diagnosis not present

## 2022-02-15 DIAGNOSIS — Z7985 Long-term (current) use of injectable non-insulin antidiabetic drugs: Secondary | ICD-10-CM | POA: Diagnosis not present

## 2022-02-15 DIAGNOSIS — J9601 Acute respiratory failure with hypoxia: Secondary | ICD-10-CM | POA: Diagnosis not present

## 2022-02-15 DIAGNOSIS — I110 Hypertensive heart disease with heart failure: Secondary | ICD-10-CM | POA: Diagnosis not present

## 2022-02-15 DIAGNOSIS — I4821 Permanent atrial fibrillation: Secondary | ICD-10-CM | POA: Diagnosis not present

## 2022-02-15 LAB — METANEPHRINES, PLASMA
Metanephrine, Free: <25 pg/mL (ref <=57)
Normetanephrine, Free: 37 pg/mL (ref <=148)
Total Metanephrines-Plasma: 37 pg/mL (ref <=205)

## 2022-02-15 LAB — ALDOSTERONE + RENIN ACTIVITY W/ RATIO
ALDO / PRA Ratio: 63.6 Ratio — ABNORMAL HIGH (ref 0.9–28.9)
Aldosterone: 7 ng/dL
Renin Activity: 0.11 ng/mL/h — ABNORMAL LOW (ref 0.25–5.82)

## 2022-02-15 LAB — EXTRA SPECIMEN

## 2022-02-17 ENCOUNTER — Telehealth: Payer: Self-pay | Admitting: Internal Medicine

## 2022-02-17 ENCOUNTER — Ambulatory Visit (INDEPENDENT_AMBULATORY_CARE_PROVIDER_SITE_OTHER): Payer: Medicare Other | Admitting: *Deleted

## 2022-02-17 DIAGNOSIS — I5032 Chronic diastolic (congestive) heart failure: Secondary | ICD-10-CM

## 2022-02-17 DIAGNOSIS — E1165 Type 2 diabetes mellitus with hyperglycemia: Secondary | ICD-10-CM

## 2022-02-17 MED ORDER — SPIRONOLACTONE 25 MG PO TABS: 25.0000 mg | ORAL_TABLET | Freq: Every day | ORAL | 1 refills | Status: DC

## 2022-02-17 NOTE — Telephone Encounter (Signed)
Spoke to the patient on 02/17/2022 at 1530 ? ? ?Renin is suppressed in the setting of a normal aldosterone, as well as an elevated Aldo/renin ratio.  This confirms hyperaldosteronemia and confirmation testing is NOT needed as she is already on ARB (losartan). ? ? ?I have recommended medical management given CHF, advanced age and extensive cardiovascular disease.  As I doubt that the patient will be able to withstand adrenal venous sampling followed by adrenalectomy. ? ? ? ? ? Latest Reference Range & Units 02/07/22 10:47  ?ALDOSTERONE  ng/dL 7  ?ALDO / PRA Ratio 0.9 - 28.9 Ratio 63.6 (H)  ?Metanephrine, Pl <=57 pg/mL <25  ?Normetanephrine, Pl <=148 pg/mL 37  ?Glucose 70 - 99 mg/dL 146 (H)  ?Renin Activity 0.25 - 5.82 ng/mL/h 0.11 (L)  ? ? ?Medication ?Start spironolactone 25 mg daily ?Stop KCl ? ? ?

## 2022-02-17 NOTE — Chronic Care Management (AMB) (Signed)
?Chronic Care Management  ? ?CCM RN Visit Note ? ?02/17/2022 ?Name: [name] MRN: [id number] DOB: 1940/10/27 ? ?Subjective: ?[name] is a 81 y.o. year old female who is a primary care patient of Mitchel Honour, Ines Bloomer, MD. The care management team was consulted for assistance with disease management and care coordination needs.   ? ?Engaged with patient by telephone for follow up visit in response to provider referral for case management and/or care coordination services.  ? ?Consent to Services:  ?The patient was given information about Chronic Care Management services, agreed to services, and gave verbal consent prior to initiation of services.  Please see initial visit note for detailed documentation.  ?Patient agreed to services and verbal consent obtained.  ? ?Assessment: Review of patient past medical history, allergies, medications, health status, including review of consultants reports, laboratory and other test data, was performed as part of comprehensive evaluation and provision of chronic care management services.  ?CCM Care Plan ? ?Allergies  ?Allergen Reactions  ? Doxycycline   ?  Made patient feel very ill   ? Metformin Diarrhea  ? ?Outpatient Encounter Medications as of 02/17/2022  ?Medication Sig Note  ? Accu-Chek Softclix Lancets lancets 1 each by Other route 3 (three) times daily. as directed   ? allopurinol (ZYLOPRIM) 300 MG tablet TAKE 1 TABLET(300 MG) BY MOUTH DAILY   ? azelastine (ASTELIN) 0.1 % nasal spray 1-2 puffs each nostril at bedtime as needed   ? b complex vitamins capsule Take 1 capsule by mouth daily.   ? blood glucose meter kit and supplies Dispense based on patient and insurance preference. Use up to four times daily as directed. (FOR ICD-10 E10.9, E11.9).   ? BREZTRI AEROSPHERE 160-9-4.8 MCG/ACT AERO USE 2 INHALATIONS BY MOUTH IN  THE MORNING AND AT BEDTIME   ? cholecalciferol (VITAMIN D3) 25 MCG (1000 UNIT) tablet Take 1,000 Units by mouth daily.   ? cloNIDine (CATAPRES)  0.3 MG tablet TAKE 1 TABLET BY MOUTH  TWICE DAILY   ? Continuous Blood Gluc Receiver (FREESTYLE LIBRE 14 DAY READER) DEVI Use as directed.   ? Continuous Blood Gluc Sensor (FREESTYLE LIBRE 14 DAY SENSOR) MISC Use as directed to check blood sugar daily   ? diltiazem (CARDIZEM) 30 MG tablet Take 1 tablet (30 mg total) by mouth See admin instructions. Take one tablet every 4 hours as needed for heart rate greater than 100   ? dronedarone (MULTAQ) 400 MG tablet Take 1 tablet (400 mg total) by mouth 2 (two) times daily.   ? ELIQUIS 5 MG TABS tablet TAKE 1 TABLET(5 MG) BY MOUTH TWICE DAILY   ? Insulin Lispro Prot & Lispro (HUMALOG MIX 75/25 KWIKPEN) (75-25) 100 UNIT/ML Kwikpen Inject 25 Units into the skin 2 (two) times daily. (Patient taking differently: Inject 15-20 Units into the skin 2 (two) times daily.) 11/27/2021: 11/27/21- patient reports consistently taking 15-17 Units BID; adjusts around blood sugars at home-- reports was "too low" with 25 Units  ? ipratropium (ATROVENT HFA) 17 MCG/ACT inhaler Inhale 2 puffs into the lungs every 6 (six) hours as needed for wheezing.   ? ketorolac (ACULAR) 0.5 % ophthalmic solution SMARTSIG:In Eye(s)   ? levothyroxine (SYNTHROID) 75 MCG tablet TAKE 1 TABLET BY MOUTH  DAILY BEFORE BREAKFAST   ? lidocaine (LIDODERM) 5 % Place 1 patch onto the skin daily. Remove & Discard patch within 12 hours or as directed by MD   ? losartan (COZAAR) 100 MG tablet Take 100 mg by mouth  daily.   ? metoprolol tartrate (LOPRESSOR) 25 MG tablet Take 1 tablet (25 mg total) by mouth 2 (two) times daily.   ? NOVOFINE PLUS PEN NEEDLE 32G X 4 MM MISC USE TWICE DAILY AS DIRECTED (Patient taking differently: 1 each by Other route as directed.)   ? ONETOUCH VERIO test strip 1 each by Other route 3 (three) times daily.   ? potassium chloride (KLOR-CON) 10 MEQ tablet Take 4 tablets (40 mEq total) by mouth daily.   ? RESTASIS MULTIDOSE 0.05 % ophthalmic emulsion Place 1 drop into both eyes 2 (two) times daily.    ?  rosuvastatin (CRESTOR) 10 MG tablet TAKE 1 TABLET BY MOUTH IN  THE EVENING   ? Semaglutide, 2 MG/DOSE, (OZEMPIC, 2 MG/DOSE,) 8 MG/3ML SOPN Inject 2 mg into the skin once a week.   ? TOBRADEX ophthalmic ointment SMARTSIG:Sparingly In Eye(s) Every Night   ? torsemide (DEMADEX) 20 MG tablet Take 1 tablet (20 mg total) by mouth daily.   ? triamcinolone cream (KENALOG) 0.1 % Apply 1 application topically 2 (two) times daily.   ? verapamil (CALAN) 120 MG tablet Take 1 tablet (120 mg total) by mouth in the morning.   ? vitamin B-12 (CYANOCOBALAMIN) 1000 MCG tablet Take 1,000 mcg by mouth daily.   ? ?No facility-administered encounter medications on file as of 02/17/2022.  ? ?Patient Active Problem List  ? Diagnosis Date Noted  ? Coronary artery disease involving native coronary artery of native heart without angina pectoris 02/12/2022  ? Adrenal adenoma, right 02/07/2022  ? Skin lesion of right arm 02/02/2022  ? Asthma 11/01/2021  ? Normocytic anemia 10/11/2021  ? Nodule of right lung 09/30/2021  ? Dysthymia 03/12/2021  ? Primary osteoarthritis involving multiple joints 03/01/2021  ? Recurrent falls 01/10/2021  ? Sensorineural hearing loss (SNHL), 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  ? Persistent atrial fibrillation ([place]) 08/12/2020  ? Type 2 diabetes mellitus with hyperglycemia, with long-term current use of insulin ([place]) 06/21/2020  ? Type 2 diabetes mellitus with diabetic polyneuropathy, with long-term current use of insulin ([place]) 06/21/2020  ? Mixed hyperlipidemia 06/21/2020  ? Uncontrolled hypertension 06/11/2020  ? Aortic atherosclerosis ([place]) 03/28/2020  ? Diabetic peripheral neuropathy ([place]) 12/26/2019  ? Gastroesophageal reflux disease without esophagitis 03/17/2019  ? Bilateral leg edema 02/16/2019  ? Varicose veins of both legs with edema 02/16/2019  ? Heart murmur 09/05/2018  ? Restrictive lung disease 02/22/2018  ? Dysgeusia 06/11/2017  ? Osteoarthritis of left knee 06/03/2017  ? NAFLD (nonalcoholic fatty liver disease)  08/07/2016  ? Osteoporosis 12/31/2015  ? Obesity 12/19/2015  ? Edema 10/18/2015  ? Acquired hypothyroidism 10/18/2015  ? Prolonged QT interval 10/03/2013  ? Long term (current) use of anticoagulants 06/09/2012  ? BENIGN NEOPLASM OF ADRENAL GLAND 11/25/2010  ? Chronic diastolic heart failure ([place]) 02/20/2010  ? DM 05/16/2009  ? GOUT 05/16/2009  ? OBESITY, MORBID 05/16/2009  ? Hypertension associated with diabetes ([place]) 05/16/2009  ? Paroxysmal atrial fibrillation ([place]) 05/16/2009  ? HYPERLIPIDEMIA 11/30/2008  ? Obstructive sleep apnea on CPAP 11/30/2008  ? ?Conditions to be addressed/monitored:  CHF and DMII ? ?Care Plan : RN Care Manager Plan of Care  ?Updates made by Knox Royalty, RN since 02/17/2022 12:00 AM  ?  ? ?Problem: Chronic Disease Management Needs   ?Priority: High  ?  ? ?Long-Range Goal: Ongoing adherence to established care plan for long term chronic disease management   ?Start Date: 10/09/2021  ?Expected End Date: 10/09/2022  ?Priority: High  ?Note:   ?  Current Barriers:  ?Chronic Disease Management support and education needs related to Atrial Fibrillation, CHF, and DMII ?Fragile state of health, multiple progressing chronic health conditions-- requires ongoing reinforcement/ support for self-health management of multiple co-morbidities ?Recent hospitalization October 11, 2021- October 20, 2021: A-Fib with RVR; reports was at [facility]- patient reports received cardioversion during hospitalization ?Current hospitalization April 29- present at "[facility];" for acute respiratory failure/ CHF/ pmeumonia ? ?RNCM Clinical Goal(s):  ?Patient will demonstrate ongoing health management independence as evidenced by adherence to established plan of care for CHF/ AF, and DMII        through collaboration with RN Care manager, provider, and care team.  ? ?Interventions: ?1:1 collaboration with primary care provider regarding development and update of comprehensive plan of care as evidenced by  provider attestation and co-signature ?Inter-disciplinary care team collaboration (see longitudinal plan of care) ?Evaluation of current treatment plan related to  self management and patient's adherence to plan

## 2022-02-18 ENCOUNTER — Telehealth: Payer: Self-pay

## 2022-02-18 DIAGNOSIS — Z794 Long term (current) use of insulin: Secondary | ICD-10-CM | POA: Diagnosis not present

## 2022-02-18 DIAGNOSIS — E1142 Type 2 diabetes mellitus with diabetic polyneuropathy: Secondary | ICD-10-CM | POA: Diagnosis not present

## 2022-02-18 NOTE — Telephone Encounter (Signed)
Patient scheduled for 03/06/22  ?

## 2022-02-18 NOTE — Telephone Encounter (Signed)
Patient states that she didn't understanding result and would like a callback to go back over with interpreter.  ?

## 2022-02-19 ENCOUNTER — Other Ambulatory Visit: Payer: Medicare Other | Admitting: *Deleted

## 2022-02-19 DIAGNOSIS — I5032 Chronic diastolic (congestive) heart failure: Secondary | ICD-10-CM | POA: Diagnosis not present

## 2022-02-19 DIAGNOSIS — Z79899 Other long term (current) drug therapy: Secondary | ICD-10-CM | POA: Diagnosis not present

## 2022-02-19 DIAGNOSIS — I4819 Other persistent atrial fibrillation: Secondary | ICD-10-CM | POA: Diagnosis not present

## 2022-02-19 LAB — BASIC METABOLIC PANEL
BUN/Creatinine Ratio: 25 (ref 12–28)
BUN: 19 mg/dL (ref 8–27)
CO2: 29 mmol/L (ref 20–29)
Calcium: 9.3 mg/dL (ref 8.7–10.3)
Chloride: 98 mmol/L (ref 96–106)
Creatinine, Ser: 0.77 mg/dL (ref 0.57–1.00)
Glucose: 198 mg/dL — ABNORMAL HIGH (ref 70–99)
Potassium: 3.6 mmol/L (ref 3.5–5.2)
Sodium: 140 mmol/L (ref 134–144)
eGFR: 79 mL/min/{1.73_m2} (ref >59)

## 2022-02-19 NOTE — Telephone Encounter (Signed)
Called and spoke with Andee Poles asking for an update on this patient's ONO. She states that when they called the patient she was currently admitted int the hospital. Looks like patient got discharged yesterday. Danielle said she will have them follow up with her to do testing. Nothing further needed at this time. ?

## 2022-02-20 ENCOUNTER — Other Ambulatory Visit: Payer: Self-pay | Admitting: Internal Medicine

## 2022-02-20 DIAGNOSIS — I480 Paroxysmal atrial fibrillation: Secondary | ICD-10-CM

## 2022-02-20 NOTE — Telephone Encounter (Signed)
Prescription refill request for Eliquis received. ?Indication: Afib  ?Last office visit: 12/06/21 Chalmers Cater) ?Scr: 0.77 (02/19/22) ?Age: 81 ?Weight: 110.2kg ? ?Appropriate dose and refill sent to requested pharmacy.  ?

## 2022-02-24 ENCOUNTER — Encounter: Payer: Self-pay | Admitting: Emergency Medicine

## 2022-02-24 ENCOUNTER — Ambulatory Visit (INDEPENDENT_AMBULATORY_CARE_PROVIDER_SITE_OTHER): Payer: Medicare Other | Admitting: Emergency Medicine

## 2022-02-24 VITALS — BP 126/64 | HR 77 | Temp 98.2°F | Ht 61.0 in | Wt 249.0 lb

## 2022-02-24 DIAGNOSIS — Z09 Encounter for follow-up examination after completed treatment for conditions other than malignant neoplasm: Secondary | ICD-10-CM | POA: Diagnosis not present

## 2022-02-24 DIAGNOSIS — E1159 Type 2 diabetes mellitus with other circulatory complications: Secondary | ICD-10-CM

## 2022-02-24 DIAGNOSIS — I152 Hypertension secondary to endocrine disorders: Secondary | ICD-10-CM | POA: Diagnosis not present

## 2022-02-24 DIAGNOSIS — I5032 Chronic diastolic (congestive) heart failure: Secondary | ICD-10-CM | POA: Diagnosis not present

## 2022-02-24 DIAGNOSIS — E1165 Type 2 diabetes mellitus with hyperglycemia: Secondary | ICD-10-CM | POA: Diagnosis not present

## 2022-02-24 DIAGNOSIS — Z794 Long term (current) use of insulin: Secondary | ICD-10-CM

## 2022-02-24 NOTE — Progress Notes (Signed)
[name] ?81 y.o. ? ? ?Chief Complaint  ?Patient presents with  ? Hospitalization Follow-up  ? ? ?HISTORY OF PRESENT ILLNESS: ?This is a 81 y.o. female here for hospital discharge follow-up. ?Admitted with CHF exacerbation, possible pneumonia. ?Doing much better.  No complaints or medical concerns today. ?Hospital discharge summary as follows: ?Discharge Summaries ?- documented in this encounter ?Mohsin Teena Dunk, MD - 02/18/2022 10:47 AM EDT ?Formatting of this note is different from the original. ? ? ?[place] [facility] ? ?Discharge Summary ? ?PCP: No primary care provider on file. ?Discharge Details  ? ?Admit date: 02/15/2022 ?Discharge date and time: 02/18/2022 ?Hospital LOS: 4 ?days ? ?Active Hospital Problems  ?Diagnosis Date Noted POA  ? *Acute and chr resp failure, unsp w hypoxia or hypercapnia (*) 02/15/2022 Yes  ? T2DM (type 2 diabetes mellitus) (*) 02/15/2022 Yes  ? Acute respiratory failure with hypoxemia (*) 02/15/2022 Yes  ? Acute on chronic diastolic heart failure (*) 10/12/2021 Yes  ? Atrial fibrillation with RVR (*) 10/11/2021 Yes  ? Primary hypertension 10/11/2021 Yes  ? OSA (obstructive sleep apnea) 10/11/2021 Yes  ? ?Resolved Hospital Problems  ?No resolved problems to display.  ? ?Current Discharge Medication List  ? ?DISCONTINUED medications  ? ?HYDROcodone-homatropine (HYCODAN,HYDROMET) 5-1.5 mg/5 mL solution  ? ?lisinopril (PRINIVIL,ZESTRIL) 20 mg tablet  ? ?Semaglutide, 1 MG/DOSE, (OZEMPIC, 1 MG/DOSE,) 4 MG/3ML SOPN pen  ? ? ?NEW medications  ?Details  ?amoxicillin-clavulanate (AUGMENTIN) 875-125 mg per tablet Take one tablet by mouth every 12 (twelve) hours for 7 days. ?Start date: 02/18/2022, End date: 02/25/2022  ? ? ?CONTINUED medications  ?Details  ?allopurinol (ZYLOPRIM) 300 mg tablet Take one tablet (300 mg dose) by mouth daily.  ? ?apixaban (ELIQUIS) 5 mg tablet Take one tablet (5 mg dose) by mouth 2 (two) times daily.  ? ?b  complex vitamins capsule Take one capsule by mouth daily.  ? ?Cholecalciferol 25 MCG (1000 UT) tablet Take one tablet (1,000 Units dose) by mouth daily.  ? ?clonidine (CATAPRES) 0.3 mg tablet Take one tablet (0.3 mg dose) by mouth 2 (two) times daily.  ? ?cyanocobalamin 1000 MCG tablet Take one tablet (1,000 mcg dose) by mouth daily.  ? ?cyclobenzaprine (FLEXERIL) 5 mg tablet Take one tablet (5 mg dose) by mouth as needed.  ? ?dronedarone (MULTAQ) 400 MG tablet Take one tablet (400 mg dose) by mouth 2 (two) times daily with meals.  ? ?insulin lispro protamine-insulin lispro (HUMALOG MIX 75/25 KWIKPEN) (75-25) 100 UNIT/ML SUPN Inject twenty five Units into the skin.  ? ?ipratropium bromide (ATROVENT HFA) 17 mcg/act inhaler Inhale two puffs into the lungs every 6 (six) hours as needed.  ? ?levothyroxine sodium (SYNTHROID,LEVOTHROID,LEVOXYL) 75 mcg tablet Take one tablet (75 mcg dose) by mouth daily.  ? ?losartan potassium (COZAAR) 100 mg tablet Take one tablet (100 mg dose) by mouth daily.  ? ?metoprolol tartrate (LOPRESSOR) 25 mg tablet Take one tablet (25 mg dose) by mouth 2 (two) times daily.  ? ?potassium chloride (K-TAB,KLOR-CON) 10 mEq CR tablet Take four tablets (40 mEq dose) by mouth daily.  ? ?rosuvastatin calcium (CRESTOR) 10 mg tablet Take one tablet (10 mg dose) by mouth at bedtime.  ? ?Semaglutide, 2 MG/DOSE, (OZEMPIC, 2 MG/DOSE,) 8 MG/3ML SOPN pen Inject 2 mg into the skin once a week at 0900.  ? ?torsemide (DEMADEX) 20 mg tablet Take one tablet (20 mg dose) by mouth daily.  ?  ? ?HPI ? ? ?Prior to Admission  medications   ?Medication Sig Start Date End Date Taking? Authorizing Provider  ?Accu-Chek Softclix Lancets lancets 1 each by Other route 3 (three) times daily. as directed 03/30/20  Yes Maximiano Coss, NP  ?allopurinol (ZYLOPRIM) 300 MG tablet TAKE 1 TABLET(300 MG) BY MOUTH DAILY 10/18/21  Yes Alyus Mofield, Ines Bloomer, MD  ?apixaban (ELIQUIS) 5 MG TABS tablet TAKE 1 TABLET BY MOUTH  TWICE DAILY 02/20/22   Yes [name], MD  ?azelastine (ASTELIN) 0.1 % nasal spray 1-2 puffs each nostril at bedtime as needed 01/02/22  Yes Young, Tarri Fuller D, MD  ?b complex vitamins capsule Take 1 capsule by mouth daily.   Yes [provider]  ?blood glucose meter kit and supplies Dispense based on patient and insurance preference. Use up to four times daily as directed. (FOR ICD-10 E10.9, E11.9). 08/29/21  Yes Laurissa Cowper, Ines Bloomer, MD  ?BREZTRI AEROSPHERE 160-9-4.8 MCG/ACT AERO USE 2 INHALATIONS BY MOUTH IN  THE MORNING AND AT BEDTIME 11/12/21  Yes Cobb, Karie Schwalbe, NP  ?cholecalciferol (VITAMIN D3) 25 MCG (1000 UNIT) tablet Take 1,000 Units by mouth daily.   Yes [provider]  ?cloNIDine (CATAPRES) 0.3 MG tablet TAKE 1 TABLET BY MOUTH  TWICE DAILY 01/28/22  Yes Braden Cimo, Ines Bloomer, MD  ?Continuous Blood Gluc Receiver (FREESTYLE LIBRE 14 DAY READER) DEVI Use as directed. 08/29/21  Yes Mekiah Cambridge, Ines Bloomer, MD  ?Continuous Blood Gluc Sensor (FREESTYLE LIBRE 14 DAY SENSOR) MISC Use as directed to check blood sugar daily 08/29/21  Yes Marrianne Sica, Ines Bloomer, MD  ?diltiazem (CARDIZEM) 30 MG tablet Take 1 tablet (30 mg total) by mouth See admin instructions. Take one tablet every 4 hours as needed for heart rate greater than 100 08/26/21  Yes Sherran Needs, NP  ?dronedarone (MULTAQ) 400 MG tablet Take 1 tablet (400 mg total) by mouth 2 (two) times daily. 12/19/21  Yes [name], MD  ?Insulin Lispro Prot & Lispro (HUMALOG MIX 75/25 KWIKPEN) (75-25) 100 UNIT/ML Kwikpen Inject 25 Units into the skin 2 (two) times daily. ?Patient taking differently: Inject 15-20 Units into the skin 2 (two) times daily. 10/04/21  Yes [name], Melanie Crazier, MD  ?ipratropium (ATROVENT HFA) 17 MCG/ACT inhaler Inhale 2 puffs into the lungs every 6 (six) hours as needed for wheezing. 12/17/21 12/17/22 Yes Baird Lyons D, MD  ?ketorolac (ACULAR) 0.5 % ophthalmic solution SMARTSIG:In Eye(s) 08/22/21  Yes [provider]   ?levothyroxine (SYNTHROID) 75 MCG tablet TAKE 1 TABLET BY MOUTH  DAILY BEFORE BREAKFAST 10/24/21  Yes Shjon Lizarraga, Ines Bloomer, MD  ?lidocaine (LIDODERM) 5 % Place 1 patch onto the skin daily. Remove & Discard patch within 12 hours or as directed by MD 08/29/20  Yes Mcarthur Rossetti, MD  ?losartan (COZAAR) 100 MG tablet Take 100 mg by mouth daily. 12/28/21  Yes [provider]  ?metoprolol tartrate (LOPRESSOR) 25 MG tablet Take 1 tablet (25 mg total) by mouth 2 (two) times daily. 12/11/21 03/11/22 Yes Dang Mathison, Ines Bloomer, MD  ?NOVOFINE PLUS PEN NEEDLE 32G X 4 MM MISC USE TWICE DAILY AS DIRECTED ?Patient taking differently: 1 each by Other route as directed. 04/08/21  Yes Rozalynn Buege, Ines Bloomer, MD  ?[facility] VERIO test strip 1 each by Other route 3 (three) times daily. 06/08/20  Yes [provider]  ?RESTASIS MULTIDOSE 0.05 % ophthalmic emulsion Place 1 drop into both eyes 2 (two) times daily.  03/04/20  Yes [provider]  ?rosuvastatin (CRESTOR) 10 MG tablet TAKE 1 TABLET BY MOUTH IN  THE EVENING 04/25/21  Yes Zamira Hickam, Ines Bloomer, MD  ?Semaglutide, 2 MG/DOSE, (OZEMPIC, 2 MG/DOSE,) 8 MG/3ML SOPN Inject 2 mg into the skin once a week. 02/07/22  Yes [name], Melanie Crazier, MD  ?spironolactone (ALDACTONE) 25 MG tablet Take 1 tablet (25 mg total) by mouth daily. 02/17/22  Yes [name], Melanie Crazier, MD  ?[facility] ophthalmic ointment SMARTSIG:Sparingly In Eye(s) Every Night 08/25/21  Yes [provider]  ?torsemide (DEMADEX) 20 MG tablet Take 1 tablet (20 mg total) by mouth daily. 05/30/21  Yes Sherran Needs, NP  ?triamcinolone cream (KENALOG) 0.1 % Apply 1 application topically 2 (two) times daily. 07/26/20  Yes Guy Toney, Ines Bloomer, MD  ?verapamil (CALAN) 120 MG tablet Take 1 tablet (120 mg total) by mouth in the morning. 08/16/21  Yes [name], MD  ?vitamin B-12 (CYANOCOBALAMIN) 1000 MCG tablet Take 1,000 mcg by mouth daily.   Yes [provider]  ?potassium  chloride (KLOR-CON) 10 MEQ tablet Take 4 tablets (40 mEq total) by mouth daily. 12/10/21 01/09/22  Shirley Friar, PA-C  ? ? ?Allergies  ?Allergen Reactions  ? Doxycycline   ?  Made patient feel ver

## 2022-02-24 NOTE — Patient Instructions (Signed)
Heart Failure, Diagnosis  Heart failure means that your heart is not able to pump blood in the right way. This makes it hard for your body to work well. Heart failure is usually a long-term (chronic) condition. You must take good care of yourself and follow your treatment plan from your doctor. Different stages of heart failure have different treatment plans. The stages are: Stage A: At risk for heart failure. Stage B: Pre-heart failure. Stage C: Symptomatic heart failure. Stage D: Advanced heart failure. What are the causes? High blood pressure. Buildup of cholesterol and fat in the arteries. Heart attack. This injures the heart muscle. Heart valves that do not open and close properly. Damage of the heart muscle. This is also called cardiomyopathy. Infection of the heart muscle. This is also called myocarditis. Lung disease. What increases the risk? Getting older. The risk of heart failure goes up as a person ages. Being overweight. Using tobacco or nicotine products. Abusing alcohol or drugs. Having taken medicines that can damage the heart. Having any of these conditions: Diabetes. Abnormal heart rhythms. Thyroid problems. Low blood counts (anemia). Having a family history of heart failure. What are the signs or symptoms? Shortness of breath. Coughing. Swelling of the feet, ankles, legs, or belly. Losing or gaining weight for no reason. Trouble breathing. Waking from sleep because of the need to sit up and get more air. Fast heartbeat. Other symptoms may include: Being very tired. Feeling dizzy, or feeling like you may pass out (faint). Having no desire to eat. Feeling like you may vomit (nauseous). Peeing (urinating) more at night. Feeling confused. How is this treated? This condition may be treated with: Medicines. These can be given to treat blood pressure and to make the heart muscles stronger. Changes in your daily life. These may include: Eating a healthy  diet. Staying at a healthy body weight. Quitting tobacco, alcohol, and drug use. Doing exercises. Participating in a cardiac rehabilitation program. This program helps you improve your health through exercise, education, and counseling. Surgery. Surgery can be done to open blocked valves or to put devices in the heart, such as pacemakers. A donor heart (heart transplant). You will receive a healthy heart from a donor. Follow these instructions at home: Treat other conditions as told by your doctor. These may include high blood pressure, diabetes, thyroid disease, or abnormal heart rhythms. Learn as much as you can about heart failure. Get support as you need it. Keep all follow-up visits. Where to find more information American Heart Association: www.heart.org Centers for Disease Control and Prevention: www.cdc.gov National Institute on Aging: www.nia.nih.gov Summary Heart failure means that your heart is not able to pump blood in the right way. This condition is often caused by high blood pressure, heart attack, or damage of the heart muscle. Symptoms of this condition include shortness of breath and swelling of the feet, ankles, legs, or belly. You may also feel very tired or feel like you may vomit. You may be treated with medicines, surgery, or changes in your daily life. Treat other health conditions as told by your doctor. This information is not intended to replace advice given to you by your health care provider. Make sure you discuss any questions you have with your health care provider. Document Revised: 04/04/2021 Document Reviewed: 04/28/2020 Elsevier Patient Education  2023 [employer]  

## 2022-02-24 NOTE — Assessment & Plan Note (Signed)
Found to have acute systolic failure on top of chronic diastolic failure ?Presently asymptomatic and euvolemic. ?Clinically stable. ?

## 2022-02-24 NOTE — Assessment & Plan Note (Signed)
Well-controlled hypertension and diabetes. ?Lab Results  ?Component Value Date  ? HGBA1C 7.3 (A) 02/07/2022  ? ?BP Readings from Last 3 Encounters:  ?02/24/22 126/64  ?02/11/22 132/76  ?02/07/22 130/72  ? ? ?

## 2022-02-26 ENCOUNTER — Ambulatory Visit: Payer: Medicare Other | Admitting: *Deleted

## 2022-02-26 DIAGNOSIS — E1165 Type 2 diabetes mellitus with hyperglycemia: Secondary | ICD-10-CM

## 2022-02-26 DIAGNOSIS — I5032 Chronic diastolic (congestive) heart failure: Secondary | ICD-10-CM

## 2022-02-26 NOTE — Patient Instructions (Signed)
Visit Information ? ?[name], thank you for taking time to talk with me today. Please don't hesitate to contact me if I can be of assistance to you before our next scheduled telephone appointment ? ?As we discussed today: ?I have made Dr. Mitchel Honour aware that you would like to have home health services for a bath aide, after your recent hospitalization, and have requested that he consider ordering this service for you-- if he does order this service, the home health agency will contact you to schedule a visit with the bath aide ?You should contact your medicaid insurance plan to find out if you have a benefit for "personal care services" ?I have asked the Clinical Social Worker at Dr. Barry Brunner office to contact you to see if your medicaid plan includes a benefit for personal care services- please engage with the Clinical Social Worker when she calls you ? ?Below are the goals we discussed today:  ?Patient Self-Care Activities: ?[name] will: ?Take medications as prescribed ?Attend all scheduled provider appointments ?Call pharmacy for medication refills ?Call provider office for new concerns or questions ?Continue to check fasting (first thing in the morning, before eating) and 2-hours AFTER eating blood sugars at home-- this will help your doctors know if your medications are working or if they need adjusting: we will review these values each time we talk over the phone  ?Your blood sugar goal for first thing in the morning before eating is between 110-130 ?Your blood sugar goal for 2-hours after eating is between 160-180 ?Continue to monitor and write down on paper your morning weights every day: if you gain more than 3 lbs overnight, or 5 lbs in one week: please call your cardiology provider for instructions as to what you should do-- the weight you reported today is: 249 lbs ?Watch for swelling in feet, ankles and legs every day ?Wear the compression stockings that the cardiology staff recommended on days  that you notice swelling in your feet/ ankles ?Continue to follow heart healthy, low salt, low cholesterol, carbohydrate-modified, low sugar diet ?Try to stay as active as possible, and please take effort to not rush-- rushing will increase your anxiety and may cause you to fall ?Keep up the great work preventing falls-- continue to use your cane and/ or walker as needed ? ?Our next scheduled telephone follow up visit/ appointment is scheduled on: Tuesday, April 08, 2022 at 10:30 am -- This is a PHONE [place] appointment ? ?If you need to cancel or re-schedule our visit, please call [phone] and our care guide team will be happy to assist you. ?  ?I look forward to hearing about your progress. ?  ?[name], RN, BSN, CCRN Alumnus ?[place] ?([phone]: direct office ? ?If you are experiencing a Mental Health or [place] or need someone to talk to, please  ?call the Suicide and Crisis Lifeline: 988 ?call the [place] National Suicide Prevention Lifeline: (762) 083-6045 or TTY: (504)471-3054 TTY 3064019098) to talk to a trained counselor ?call 1-800-273-TALK (toll free, 24 hour hotline) ?go to [facility] [address] [place] [phone]) ?call 911  ? ?The patient verbalized understanding of instructions, educational materials, and care plan provided today and agreed to receive a mailed copy of patient instructions, educational materials, and care plan ? ?Living With Heart Failure ?Heart failure is a long-term (chronic) condition in which the heart cannot pump enough blood through the body. When this happens,  parts of the body do not get the blood and oxygen they need. ?There is no cure for heart failure at this time, so it is important for you to take good care of yourself and follow the treatment plan you set with your health care provider. If you are living with heart failure, there are ways to help  you manage the disease. ?How to manage lifestyle changes ?Living with heart failure requires you to make changes in your life. Your health care team will teach you about the changes you need to make in order to relieve your symptoms and lower your risk of going to the hospital. Work with your health care provider to develop a treatment plan that works for you. ?Activity ?Ask your health care provider about attending cardiac rehabilitation. These programs include aerobic physical activity, which provides many benefits for your heart. ?If no cardiac rehabilitation program is available, ask your health care provider what aerobic exercises are safe for you to do. ?Return to your normal activities as told by your health care provider. Ask your health care provider what activities are safe for you. ?Pace your daily activities and allow time for rest as needed. ?Managing stress ?It is normal to have many emotions about your diagnosis, such as fear, sadness, anger, and loss. If you feel any of these emotions and need help coping, contact your health care provider. Here are some ways to help yourself manage these emotions: ?Talk to friends and family members about your condition. They can give you support and guidance. Explain your symptoms to them and, if comfortable, invite them to attend appointments or rehabilitation with you. ?Join a support group for people with chronic heart failure. Talking with other people who have the same symptoms may give you new ways of coping with your disease and your emotions. ?Accept help from others. Do not be ashamed if you need help with certain tasks. ?Use stress management techniques, such as meditation, breathing exercises, or listening to relaxing music. ?Conditions such as depression and anxiety are common in persons with heart failure. Pay attention to changes in your mood, emotions, and stress levels. Tell your health care provider if you have any of the following  symptoms: ?Trouble sleeping or a change in your sleeping patterns. ?Feeling sad, down, or depressed more often than not, every day for more than 2 weeks. ?Losing interest in activities you normally enjoy. ?Feeling irritable or crying for no reason. ?Finding yourself worrying about the future often. ?Work ?You may need to develop a plan with your health care provider if heart failure interferes with your ability to work. This may include: ?Reducing your work hours. ?Finding functions that are less active or require less effort. ?Planning rest periods during your work hours. ?Travel ?Talk with your health care provider if you plan to travel. There may be circumstances in which your health care provider recommends that you do not travel or that you delay travel until your condition is under control. ?When you travel, bring your medicine and a list of your medicines. If you are traveling by air, keep your medicines with you in a carry-on bag. ?Consider finding a medical facility in the area you will be traveling to and determine what your health insurance will cover. ?If you will be traveling by public transportation (airplane, train, bus), contact the company prior to traveling if you have special needs. This may include needs related to diet, oxygen, a wheelchair, a seating request, or help with luggage. ?If you  use oxygen, make sure to bring enough oxygen with you. ?If you have a battery-powered device, bring a fully charged extra battery with you. ?If you have a device, bring a note from your health care provider and inform all security screening personnel that you have the device. You may need to go through special screening for safety. ?Sexual activity ? ?Ask your health care provider when it is safe for you to resume sexual activity. ?You may need to start slowly and gradually increase intimacy. You can increase intimacy by doing such things as caressing, touching, and holding each other. ?Get regular exercise as  told by your health care provider. This can benefit your sex life by building strength and endurance. ?Sleep ?If your condition interferes with your sleep, find ways to improve your sleep quality, such as: ?Sleep lyi

## 2022-02-26 NOTE — Chronic Care Management (AMB) (Signed)
?Chronic Care Management  ? ?CCM RN Visit Note ? ?02/26/2022 ?Name: [name] MRN: [id number] DOB: [birth year] ? ?Subjective: ?[name] is a 81 y.o. year old female who is a primary care patient of Mitchel Honour, Ines Bloomer, MD. The care management team was consulted for assistance with disease management and care coordination needs.   ? ?Engaged with patient by telephone for follow up visit in response to provider referral for case management and/or care coordination services.  ? ?Consent to Services:  ?The patient was given information about Chronic Care Management services, agreed to services, and gave verbal consent prior to initiation of services.  Please see initial visit note for detailed documentation.  ?Patient agreed to services and verbal consent obtained.  ? ?Assessment: Review of patient past medical history, allergies, medications, health status, including review of consultants reports, laboratory and other test data, was performed as part of comprehensive evaluation and provision of chronic care management services.  ? ?SDOH (Social Determinants of Health) assessments and interventions performed:  ?SDOH Interventions   ? ?Flowsheet Row Most Recent Value  ?SDOH Interventions   ?Food Insecurity Interventions Intervention Not Indicated  [continues to deny food insecurity]  ?Housing Interventions Intervention Not Indicated  [continues to reside with spouse only in one level single family home,  moved into new home in [place], early in 2023- denies housing concerns today]  ?Transportation Interventions Intervention Not Indicated  [Patient continues to drive self,  family/ friends also assist as needed/ indicated]  ? ?  ?CCM Care Plan ? ?Allergies  ?Allergen Reactions  ? Doxycycline   ?  Made patient feel very ill   ? Metformin Diarrhea  ? ?Outpatient Encounter Medications as of 02/26/2022  ?Medication Sig Note  ? Accu-Chek Softclix Lancets lancets 1 each by Other route 3 (three) times daily.  as directed   ? allopurinol (ZYLOPRIM) 300 MG tablet TAKE 1 TABLET(300 MG) BY MOUTH DAILY   ? apixaban (ELIQUIS) 5 MG TABS tablet TAKE 1 TABLET BY MOUTH  TWICE DAILY   ? azelastine (ASTELIN) 0.1 % nasal spray 1-2 puffs each nostril at bedtime as needed   ? b complex vitamins capsule Take 1 capsule by mouth daily.   ? blood glucose meter kit and supplies Dispense based on patient and insurance preference. Use up to four times daily as directed. (FOR ICD-10 E10.9, E11.9).   ? BREZTRI AEROSPHERE 160-9-4.8 MCG/ACT AERO USE 2 INHALATIONS BY MOUTH IN  THE MORNING AND AT BEDTIME   ? cholecalciferol (VITAMIN D3) 25 MCG (1000 UNIT) tablet Take 1,000 Units by mouth daily.   ? cloNIDine (CATAPRES) 0.3 MG tablet TAKE 1 TABLET BY MOUTH  TWICE DAILY   ? Continuous Blood Gluc Receiver (FREESTYLE LIBRE 14 DAY READER) DEVI Use as directed.   ? Continuous Blood Gluc Sensor (FREESTYLE LIBRE 14 DAY SENSOR) MISC Use as directed to check blood sugar daily   ? diltiazem (CARDIZEM) 30 MG tablet Take 1 tablet (30 mg total) by mouth See admin instructions. Take one tablet every 4 hours as needed for heart rate greater than 100   ? dronedarone (MULTAQ) 400 MG tablet Take 1 tablet (400 mg total) by mouth 2 (two) times daily.   ? Insulin Lispro Prot & Lispro (HUMALOG MIX 75/25 KWIKPEN) (75-25) 100 UNIT/ML Kwikpen Inject 25 Units into the skin 2 (two) times daily. (Patient taking differently: Inject 15-20 Units into the skin 2 (two) times daily.) 11/27/2021: 11/27/21- patient reports consistently taking 15-17 Units BID; adjusts around blood sugars at  home-- reports was "too low" with 25 Units  ? ipratropium (ATROVENT HFA) 17 MCG/ACT inhaler Inhale 2 puffs into the lungs every 6 (six) hours as needed for wheezing.   ? ketorolac (ACULAR) 0.5 % ophthalmic solution SMARTSIG:In Eye(s)   ? levothyroxine (SYNTHROID) 75 MCG tablet TAKE 1 TABLET BY MOUTH  DAILY BEFORE BREAKFAST   ? lidocaine (LIDODERM) 5 % Place 1 patch onto the skin daily. Remove & Discard  patch within 12 hours or as directed by MD   ? losartan (COZAAR) 100 MG tablet Take 100 mg by mouth daily.   ? metoprolol tartrate (LOPRESSOR) 25 MG tablet Take 1 tablet (25 mg total) by mouth 2 (two) times daily.   ? NOVOFINE PLUS PEN NEEDLE 32G X 4 MM MISC USE TWICE DAILY AS DIRECTED (Patient taking differently: 1 each by Other route as directed.)   ? ONETOUCH VERIO test strip 1 each by Other route 3 (three) times daily.   ? potassium chloride (KLOR-CON) 10 MEQ tablet Take 4 tablets (40 mEq total) by mouth daily.   ? RESTASIS MULTIDOSE 0.05 % ophthalmic emulsion Place 1 drop into both eyes 2 (two) times daily.    ? rosuvastatin (CRESTOR) 10 MG tablet TAKE 1 TABLET BY MOUTH IN  THE EVENING   ? Semaglutide, 2 MG/DOSE, (OZEMPIC, 2 MG/DOSE,) 8 MG/3ML SOPN Inject 2 mg into the skin once a week.   ? spironolactone (ALDACTONE) 25 MG tablet Take 1 tablet (25 mg total) by mouth daily.   ? TOBRADEX ophthalmic ointment SMARTSIG:Sparingly In Eye(s) Every Night   ? torsemide (DEMADEX) 20 MG tablet Take 1 tablet (20 mg total) by mouth daily.   ? triamcinolone cream (KENALOG) 0.1 % Apply 1 application topically 2 (two) times daily.   ? verapamil (CALAN) 120 MG tablet Take 1 tablet (120 mg total) by mouth in the morning.   ? vitamin B-12 (CYANOCOBALAMIN) 1000 MCG tablet Take 1,000 mcg by mouth daily.   ? ?No facility-administered encounter medications on file as of 02/26/2022.  ? ?Patient Active Problem List  ? Diagnosis Date Noted  ? Coronary artery disease involving native coronary artery of native heart without angina pectoris 02/12/2022  ? Adrenal adenoma, right 02/07/2022  ? Skin lesion of right arm 02/02/2022  ? Asthma 11/01/2021  ? Normocytic anemia 10/11/2021  ? Nodule of right lung 09/30/2021  ? Dysthymia 03/12/2021  ? Primary osteoarthritis involving multiple joints 03/01/2021  ? Recurrent falls 01/10/2021  ? Sensorineural hearing loss (SNHL), 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  ? Persistent atrial fibrillation ([place]) 08/12/2020  ?  Type 2 diabetes mellitus with hyperglycemia, with long-term current use of insulin ([place]) 06/21/2020  ? Type 2 diabetes mellitus with diabetic polyneuropathy, with long-term current use of insulin ([place]) 06/21/2020  ? Mixed hyperlipidemia 06/21/2020  ? Uncontrolled hypertension 06/11/2020  ? Aortic atherosclerosis ([place]) 03/28/2020  ? Diabetic peripheral neuropathy ([place]) 12/26/2019  ? Gastroesophageal reflux disease without esophagitis 03/17/2019  ? Bilateral leg edema 02/16/2019  ? Varicose veins of both legs with edema 02/16/2019  ? Heart murmur 09/05/2018  ? Restrictive lung disease 02/22/2018  ? Dysgeusia 06/11/2017  ? Osteoarthritis of left knee 06/03/2017  ? NAFLD (nonalcoholic fatty liver disease) 08/07/2016  ? Osteoporosis 12/31/2015  ? Obesity 12/19/2015  ? Edema 10/18/2015  ? Acquired hypothyroidism 10/18/2015  ? Prolonged QT interval 10/03/2013  ? Long term (current) use of anticoagulants 06/09/2012  ? BENIGN NEOPLASM OF ADRENAL GLAND 11/25/2010  ? Chronic diastolic heart failure ([place]) 02/20/2010  ? DM 05/16/2009  ?  GOUT 05/16/2009  ? OBESITY, MORBID 05/16/2009  ? Hypertension associated with diabetes ([place]) 05/16/2009  ? Paroxysmal atrial fibrillation ([place]) 05/16/2009  ? HYPERLIPIDEMIA 11/30/2008  ? Obstructive sleep apnea on CPAP 11/30/2008  ? ?Conditions to be addressed/monitored:  CHF and DMII ? ?Care Plan : RN Care Manager Plan of Care  ?Updates made by Knox Royalty, RN since 02/26/2022 12:00 AM  ?  ? ?Problem: Chronic Disease Management Needs   ?Priority: High  ?  ? ?Long-Range Goal: Ongoing adherence to established care plan for long term chronic disease management   ?Start Date: 10/09/2021  ?Expected End Date: 10/09/2022  ?Priority: High  ?Note:   ?Current Barriers:  ?Chronic Disease Management support and education needs related to Atrial Fibrillation, CHF, and DMII ?Fragile state of health, multiple progressing chronic health conditions-- requires ongoing reinforcement/ support for self-health  management of multiple co-morbidities ?Recent hospitalization October 11, 2021- October 20, 2021: A-Fib with RVR; reports was at [facility]- patient reports received cardioversion during hospitalization ?R

## 2022-02-26 NOTE — Progress Notes (Signed)
? ?PCP:  Horald Pollen, MD ?Primary Cardiologist: None ?Electrophysiologist: [name], MD  ? ?[name] is a 81 y.o. female seen today for [name], MD for post hospital follow up.  ? ?Admitted due to [facility] 4/29 to 5/2 with a/c respiratory failure due to PNA, CHF, and afib. It was referred to in the chart as "permanent", though pt states she underwent cardioversion.  ? ?She reports feeling well since discharge without recurrence of AF.  She gives 3 different doses of potassium that she has been "taking", but confirms she has been taking 2 20 meq tablets for at least the past week.   She states she never started the spironolactone once she was told it was a diuretic, because she didn't want it to interact with torsemide. She denies edema. No syncope, chest pain, or palpitations.  ? ?Past Medical History:  ?Diagnosis Date  ? Arthritis   ? Back pain   ? Chronic anticoagulation   ? due to aflutter  ? Chronic kidney disease   ? Diabetes mellitus   ? Diastolic CHF, chronic (HCC)   ? a.  echo 2006 - ef 55-65%; mild diast dysfxn;    b. Echo 08/2011: Mild LVH, EF 60%;  c. 04/2013 Echo: EF 65-69%, mild conc LVH;  08/2014 Echo: EF 60-65%, mild-mod MR.  ? Gout   ? Hyperlipidemia   ? Hypertension   ? a.  Renal arterial Dopplers 12/2011: 1-59% right renal artery stenosis  ? Morbid obesity ([place])   ? Obstructive sleep apnea on CPAP   ? Paroxysmal Afib/Flutter   ? a. 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  ? ?Past Surgical History:  ?Procedure Laterality Date  ? APPENDECTOMY    ? ATRIAL FLUTTER ABLATION N/A 09/24/2011  ? Procedure: ATRIAL FLUTTER ABLATION;  Surgeon: [name], MD;  Location: [facility] CATH LAB;  Service: Cardiovascular;  Laterality: N/A;  ? CARDIOVERSION  10/22/2011  ? Procedure: CARDIOVERSION;  Surgeon: [name], MD;  Location: [place];  Service: Cardiovascular;  Laterality: N/A;  ? CARDIOVERSION N/A 09/10/2011  ? Procedure: CARDIOVERSION;  Surgeon: [name], MD;  Location: [facility] CATH LAB;   Service: Cardiovascular;  Laterality: N/A;  ? CHOLECYSTECTOMY    ? COLONOSCOPY WITH PROPOFOL N/A 06/13/2021  ? Procedure: COLONOSCOPY WITH PROPOFOL;  Surgeon: Milus Banister, MD;  Location: [facility] ENDOSCOPY;  Service: Endoscopy;  Laterality: N/A;  ? POLYPECTOMY  06/13/2021  ? Procedure: POLYPECTOMY;  Surgeon: Milus Banister, MD;  Location: [facility];  Service: Endoscopy;;  ? TONSILLECTOMY  1982  ? TOTAL ABDOMINAL HYSTERECTOMY    ? ? ?Current Outpatient Medications  ?Medication Sig Dispense Refill  ? Accu-Chek Softclix Lancets lancets 1 each by Other route 3 (three) times daily. as directed 100 each 3  ? allopurinol (ZYLOPRIM) 300 MG tablet TAKE 1 TABLET(300 MG) BY MOUTH DAILY 90 tablet 3  ? apixaban (ELIQUIS) 5 MG TABS tablet TAKE 1 TABLET BY MOUTH  TWICE DAILY 180 tablet 1  ? azelastine (ASTELIN) 0.1 % nasal spray 1-2 puffs each nostril at bedtime as needed 30 mL 12  ? b complex vitamins capsule Take 1 capsule by mouth daily.    ? blood glucose meter kit and supplies Dispense based on patient and insurance preference. Use up to four times daily as directed. (FOR ICD-10 E10.9, E11.9). 1 each 0  ? BREZTRI AEROSPHERE 160-9-4.8 MCG/ACT AERO USE 2 INHALATIONS BY MOUTH IN  THE MORNING AND AT BEDTIME 21.4 g 5  ? cholecalciferol (VITAMIN D3)  25 MCG (1000 UNIT) tablet Take 1,000 Units by mouth daily.    ? cloNIDine (CATAPRES) 0.3 MG tablet TAKE 1 TABLET BY MOUTH  TWICE DAILY 180 tablet 3  ? Continuous Blood Gluc Receiver (FREESTYLE LIBRE 14 DAY READER) DEVI Use as directed. 1 each 0  ? Continuous Blood Gluc Sensor (FREESTYLE LIBRE 14 DAY SENSOR) MISC Use as directed to check blood sugar daily 2 each 3  ? diltiazem (CARDIZEM) 30 MG tablet Take 1 tablet (30 mg total) by mouth See admin instructions. Take one tablet every 4 hours as needed for heart rate greater than 100 45 tablet 2  ? dronedarone (MULTAQ) 400 MG tablet Take 1 tablet (400 mg total) by mouth 2 (two) times daily. 180 tablet 3  ? Insulin Lispro Prot & Lispro  (HUMALOG MIX 75/25 KWIKPEN) (75-25) 100 UNIT/ML Kwikpen Inject 25 Units into the skin 2 (two) times daily. (Patient taking differently: Inject 15-20 Units into the skin 2 (two) times daily.) 45 mL 4  ? ipratropium (ATROVENT HFA) 17 MCG/ACT inhaler Inhale 2 puffs into the lungs every 6 (six) hours as needed for wheezing. 1 each 2  ? ketorolac (ACULAR) 0.5 % ophthalmic solution SMARTSIG:In Eye(s)    ? levothyroxine (SYNTHROID) 75 MCG tablet TAKE 1 TABLET BY MOUTH  DAILY BEFORE BREAKFAST 90 tablet 3  ? lidocaine (LIDODERM) 5 % Place 1 patch onto the skin daily. Remove & Discard patch within 12 hours or as directed by MD 30 patch 0  ? losartan (COZAAR) 100 MG tablet Take 100 mg by mouth daily.    ? metoprolol tartrate (LOPRESSOR) 25 MG tablet Take 1 tablet (25 mg total) by mouth 2 (two) times daily. 180 tablet 3  ? NOVOFINE PLUS PEN NEEDLE 32G X 4 MM MISC USE TWICE DAILY AS DIRECTED 100 each 11  ? ONETOUCH VERIO test strip 1 each by Other route 3 (three) times daily.    ? potassium chloride (KLOR-CON) 10 MEQ tablet Take 4 tablets (40 mEq total) by mouth daily. 120 tablet 0  ? RESTASIS MULTIDOSE 0.05 % ophthalmic emulsion Place 1 drop into both eyes 2 (two) times daily.     ? rosuvastatin (CRESTOR) 10 MG tablet TAKE 1 TABLET BY MOUTH IN  THE EVENING 90 tablet 3  ? Semaglutide, 2 MG/DOSE, (OZEMPIC, 2 MG/DOSE,) 8 MG/3ML SOPN Inject 2 mg into the skin once a week. 9 mL 3  ? spironolactone (ALDACTONE) 25 MG tablet Take 1 tablet (25 mg total) by mouth daily. 90 tablet 1  ? TOBRADEX ophthalmic ointment SMARTSIG:Sparingly In Eye(s) Every Night    ? torsemide (DEMADEX) 20 MG tablet Take 1 tablet (20 mg total) by mouth daily.    ? triamcinolone cream (KENALOG) 0.1 % Apply 1 application topically 2 (two) times daily. 30 g 0  ? verapamil (CALAN) 120 MG tablet Take 1 tablet (120 mg total) by mouth in the morning. 180 tablet 3  ? vitamin B-12 (CYANOCOBALAMIN) 1000 MCG tablet Take 1,000 mcg by mouth daily.    ? ?No current  facility-administered medications for this visit.  ? ? ?Allergies  ?Allergen Reactions  ? Doxycycline   ?  Made patient feel very ill   ? Metformin Diarrhea  ? ? ?Social History  ? ?Socioeconomic History  ? Marital status: Married  ?  Spouse name: Not on file  ? Number of children: 3  ? Years of education: Not on file  ? Highest education level: Not on file  ?Occupational History  ? Occupation:  DISABILITY/housewife  ?  Employer: RETIRED  ?Tobacco Use  ? Smoking status: Never  ? Smokeless tobacco: Never  ?Vaping Use  ? Vaping Use: Never used  ?Substance and Sexual Activity  ? Alcohol use: No  ? Drug use: No  ? Sexual activity: Yes  ?Other Topics Concern  ? Not on file  ?Social History Narrative  ? Not on file  ? ?Social Determinants of Health  ? ?Financial Resource Strain: Low Risk   ? Difficulty of Paying Living Expenses: Not hard at all  ?Food Insecurity: No Food Insecurity  ? Worried About Charity fundraiser in the Last Year: Never true  ? Ran Out of Food in the Last Year: Never true  ?Transportation Needs: No Transportation Needs  ? Lack of Transportation (Medical): No  ? Lack of Transportation (Non-Medical): No  ?Physical Activity: Inactive  ? Days of Exercise per Week: 0 days  ? Minutes of Exercise per Session: 0 min  ?Stress: No Stress Concern Present  ? Feeling of Stress : Not at all  ?Social Connections: Socially Integrated  ? Frequency of Communication with Friends and Family: More than three times a week  ? Frequency of Social Gatherings with Friends and Family: Never  ? Attends Religious Services: More than 4 times per year  ? Active Member of Clubs or Organizations: Yes  ? Attends Archivist Meetings: More than 4 times per year  ? Marital Status: Married  ?Intimate Partner Violence: Not At Risk  ? Fear of Current or Ex-Partner: No  ? Emotionally Abused: No  ? Physically Abused: No  ? Sexually Abused: No  ? ? ? ?Review of Systems: ?All other systems reviewed and are otherwise negative except as  noted above. ? ?Physical Exam: ?Vitals:  ? 02/28/22 1117  ?BP: 118/68  ?Pulse: 75  ?SpO2: 97%  ?Weight: 249 lb (112.9 kg)  ?Height: _0  (1.549 m)  ? ? ?GEN- The patient is well appearing, alert and oriented x 3

## 2022-02-28 ENCOUNTER — Ambulatory Visit (INDEPENDENT_AMBULATORY_CARE_PROVIDER_SITE_OTHER): Payer: Medicare Other | Admitting: Student

## 2022-02-28 ENCOUNTER — Encounter: Payer: Self-pay | Admitting: Student

## 2022-02-28 VITALS — BP 118/68 | HR 75 | Ht 61.0 in | Wt 249.0 lb

## 2022-02-28 DIAGNOSIS — I4819 Other persistent atrial fibrillation: Secondary | ICD-10-CM

## 2022-02-28 DIAGNOSIS — I10 Essential (primary) hypertension: Secondary | ICD-10-CM

## 2022-02-28 DIAGNOSIS — I5032 Chronic diastolic (congestive) heart failure: Secondary | ICD-10-CM

## 2022-02-28 NOTE — Patient Instructions (Addendum)
Medication Instructions:  ?Your physician recommends that you continue on your current medications as directed. Please refer to the Current Medication list given to you today. ? ?*If you need a refill on your cardiac medications before your next appointment, please call your pharmacy* ? ? ?Lab Work: ?TODAY: BMET ? ?If you have labs (blood work) drawn today and your tests are completely normal, you will receive your results only by: ?MyChart Message (if you have MyChart) OR ?A paper copy in the mail ?If you have any lab test that is abnormal or we need to change your treatment, we will call you to review the results. ? ? ?Follow-Up: ?At [facility], you and your health needs are our priority.  As part of our continuing mission to provide you with exceptional heart care, we have created designated Provider Care Teams.  These Care Teams include your primary Cardiologist (physician) and Advanced Practice Providers (APPs -  Physician Assistants and Nurse Practitioners) who all work together to provide you with the care you need, when you need it. ? ?We recommend signing up for the patient portal called "MyChart".  Sign up information is provided on this After Visit Summary.  MyChart is used to connect with patients for Virtual Visits (Telemedicine).  Patients are able to view lab/test results, encounter notes, upcoming appointments, etc.  Non-urgent messages can be sent to your provider as well.   ?To learn more about what you can do with MyChart, go to [email or web address].   ? ?Your next appointment:   ?6 month(s) ? ?The format for your next appointment:   ?In Person ? ?Provider:   ?[name], MD{ ?  ?

## 2022-03-01 LAB — BASIC METABOLIC PANEL
BUN/Creatinine Ratio: 17 (ref 12–28)
BUN: 13 mg/dL (ref 8–27)
CO2: 28 mmol/L (ref 20–29)
Calcium: 9.3 mg/dL (ref 8.7–10.3)
Chloride: 100 mmol/L (ref 96–106)
Creatinine, Ser: 0.75 mg/dL (ref 0.57–1.00)
Glucose: 177 mg/dL — ABNORMAL HIGH (ref 70–99)
Potassium: 4 mmol/L (ref 3.5–5.2)
Sodium: 141 mmol/L (ref 134–144)
eGFR: 81 mL/min/{1.73_m2} (ref >59)

## 2022-03-03 ENCOUNTER — Encounter: Payer: Self-pay | Admitting: Licensed Clinical Social Worker

## 2022-03-03 ENCOUNTER — Ambulatory Visit: Payer: Medicare Other | Admitting: Licensed Clinical Social Worker

## 2022-03-03 DIAGNOSIS — I152 Hypertension secondary to endocrine disorders: Secondary | ICD-10-CM

## 2022-03-03 DIAGNOSIS — I5032 Chronic diastolic (congestive) heart failure: Secondary | ICD-10-CM

## 2022-03-03 DIAGNOSIS — Z741 Need for assistance with personal care: Secondary | ICD-10-CM

## 2022-03-03 DIAGNOSIS — E1159 Type 2 diabetes mellitus with other circulatory complications: Secondary | ICD-10-CM

## 2022-03-03 NOTE — Chronic Care Management (AMB) (Signed)
?Chronic Care Management  ? Clinical Social Work Note ? ?03/03/2022 ?Name: [name] MRN: [id number] DOB: 1941/07/08 ? ?[name] is a 81 y.o. year old female who is a primary care patient of Sagardia, Ines Bloomer, MD. The CCM team was consulted to assist the patient with chronic disease management and/or care coordination needs related to: Level of Care Concerns.  ? ?Engaged with patient by telephone for initial visit in response to provider referral for social work chronic care management and care coordination services.  ? ?Consent to Services:  ?The patient was given information about Chronic Care Management services, agreed to services, and gave verbal consent prior to initiation of services.  Please see initial visit note for detailed documentation.  ? ?Patient agreed to services and consent obtained.  ? ?Summary: Assessed patient's current treatment, progress, coping skills, support system and barriers to care. Reports having difficulty meeting needs related to activities of daily living. LCSW will collaborate with CMA and PCP to meet patient's needs . See Care Plan below for interventions and patient self-care actives. ? ?Recommendation: Patient may benefit from, and is in agreement for LCSW to work with PCP to get personal care services form complete and faxed to [facility].  ? ?Follow up Plan: Patient would like continued follow-up from CCM LCSW.  per patient's request will follow up in 30 days.  Will call office if needed prior to next encounter. ?  ?Assessment: Review of patient past medical history, allergies, medications, and health status, including review of relevant consultants reports was performed today as part of a comprehensive evaluation and provision of chronic care management and care coordination services.    ? ?SDOH (Social Determinants of Health) assessments and interventions performed:   ? ?Advanced Directives Status: Not addressed in this encounter. ? ?CCM Care  Plan ?Conditions to be addressed/monitored: ; Level of care concerns ? ?Care Plan : LCSW Plan of Care  ?Updates made by Maurine Cane, LCSW since 03/03/2022 12:00 AM  ?  ? ?Problem: Quality of Life   ?  ? ?Goal: Quality of Life Maintained   ?Start Date: 03/03/2022  ?This Visit's Progress: On track  ?Priority: High  ?Note:   ?Current Barriers:  ?Level of Care Concerns:Inability to perform ADL's independently ? ?CSW Clinical Goal(s):  ?Patient  will patient will work with [facility] to address needs related to ADL's  through collaboration with Clinical Social Worker, provider, and care team.  ? ?Interventions: ?1:1 collaboration with primary care provider regarding development and update of comprehensive plan of care as evidenced by provider attestation and co-signature ?Inter-disciplinary care team collaboration (see longitudinal plan of care) ?Evaluation of current treatment plan related to  self management and patient's adherence to plan as established by provider ? ?Level of Care Concerns in a patient with CHF, HTN, and DMII:  (Status: New goal.) ?Current level of care: home with other family or significant other(s): spouse ?Evaluation of patient's unmet needs in current living environment ?ADL's ?Assessed needs, level of care concerns, how currently meeting needs and barriers to care ?Personal Care Services [facility]) :(has medicaid and interested in services) ?Reviewed basic eligibility and provided education on Personal Care Service process,  ?Collaborate with primary care provider ref completing PCS referral ( PCS referral sent to PCP 03/03/22 )  ? ?Task & activities to accomplish goals: ?I will work with Dr. Mitchel Honour to get the form to [facility]  ?Review personal care service information and provider list  ?Return calls from  [facility] or call them directly if you have questions [phone] or [phone]   ?  ? Casimer Lanius, LCSW ?Licensed Clinical Social Worker [employer] [employer]   ?[place]  ?[phone]  ? ?

## 2022-03-03 NOTE — Patient Instructions (Signed)
Visit Information  ? ?Thank you for taking time to visit with me today. Please don't hesitate to contact me if I can be of assistance to you before our next scheduled telephone appointment. ? ?Following are the goals we discussed today: Personal Care Services ? ?Our next appointment is by telephone on June 19th at 10:30 ? ?Please call the care guide team at [phone] if you need to cancel or reschedule your appointment.  ? ?If you are experiencing a Mental Health or [place] or need someone to talk to, please call 1-800-273-TALK (toll free, 24 hour hotline)  ? ?Following is a copy of your full care plan:  ?Care Plan : [place]  ?Updates made by Maurine Cane, LCSW since 03/03/2022 12:00 AM  ?  ? ?Problem: Quality of Life   ?  ? ?Goal: Quality of Life Maintained   ?Start Date: 03/03/2022  ?This Visit's Progress: On track  ?Priority: High  ?Note:   ?Current Barriers:  ?Level of Care Concerns:Inability to perform ADL's independently ? ?CSW Clinical Goal(s):  ?Patient  will patient will work with [facility] to address needs related to ADL's  through collaboration with Clinical Social Worker, provider, and care team.  ? ?Interventions: ?1:1 collaboration with primary care provider regarding development and update of comprehensive plan of care as evidenced by provider attestation and co-signature ?Inter-disciplinary care team collaboration (see longitudinal plan of care) ?Evaluation of current treatment plan related to  self management and patient's adherence to plan as established by provider ? ?Level of Care Concerns in a patient with CHF, HTN, and DMII:  (Status: New goal.) ?Current level of care: home with other family or significant other(s): spouse ?Evaluation of patient's unmet needs in current living environment ?ADL's ?Assessed needs, level of care concerns, how currently meeting needs and barriers to care ?Personal Care Services [facility]) :(has medicaid and interested in  services) ?Reviewed basic eligibility and provided education on Personal Care Service process,  ?Collaborate with primary care provider ref completing PCS referral ( PCS referral sent to PCP 03/03/22 )  ? ?Task & activities to accomplish goals: ?I will work with Dr. Mitchel Honour to get the form to [facility]  ?Review personal care service information and provider list  ?Return calls from [facility] or call them directly if you have questions [phone] or [phone]   ?  ? ? ?Consent to CCM Services: ?[name] was given information about Chronic Care Management services including:  ?CCM service includes personalized support from designated clinical staff supervised by her physician, including individualized plan of care and coordination with other care providers ?24/7 contact phone numbers for assistance for urgent and routine care needs. ?Service will only be billed when office clinical staff spend 20 minutes or more in a month to coordinate care. ?Only one practitioner may furnish and bill the service in a calendar month. ?The patient may stop CCM services at any time (effective at the end of the month) by phone call to the office staff. ?The patient will be responsible for cost sharing (co-pay) of up to 20% of the service fee (after annual deductible is met). ? ?Patient agreed to services and verbal consent obtained.  ? ?The patient verbalized understanding of instructions, educational materials, and care plan provided today and agreed to receive a mailed copy of patient instructions, educational materials, and care plan.  ? ? ? ? ?  ?

## 2022-03-03 NOTE — Addendum Note (Signed)
Addended by: Janan Halter F on: 03/03/2022 04:12 PM ? ? Modules accepted: Orders ? ?

## 2022-03-04 ENCOUNTER — Other Ambulatory Visit: Payer: Self-pay

## 2022-03-04 DIAGNOSIS — I10 Essential (primary) hypertension: Secondary | ICD-10-CM | POA: Diagnosis not present

## 2022-03-04 DIAGNOSIS — G4733 Obstructive sleep apnea (adult) (pediatric): Secondary | ICD-10-CM | POA: Diagnosis not present

## 2022-03-04 DIAGNOSIS — R0683 Snoring: Secondary | ICD-10-CM | POA: Diagnosis not present

## 2022-03-04 DIAGNOSIS — E876 Hypokalemia: Secondary | ICD-10-CM

## 2022-03-06 ENCOUNTER — Encounter: Payer: Self-pay | Admitting: Internal Medicine

## 2022-03-06 ENCOUNTER — Ambulatory Visit (INDEPENDENT_AMBULATORY_CARE_PROVIDER_SITE_OTHER): Payer: Medicare Other | Admitting: Internal Medicine

## 2022-03-06 VITALS — BP 116/70 | HR 70 | Ht 61.0 in | Wt 250.0 lb

## 2022-03-06 DIAGNOSIS — Z794 Long term (current) use of insulin: Secondary | ICD-10-CM | POA: Diagnosis not present

## 2022-03-06 DIAGNOSIS — E2609 Other primary hyperaldosteronism: Secondary | ICD-10-CM | POA: Diagnosis not present

## 2022-03-06 DIAGNOSIS — E1142 Type 2 diabetes mellitus with diabetic polyneuropathy: Secondary | ICD-10-CM

## 2022-03-06 DIAGNOSIS — D3501 Benign neoplasm of right adrenal gland: Secondary | ICD-10-CM | POA: Diagnosis not present

## 2022-03-06 NOTE — Patient Instructions (Addendum)
-   Continue  Humalog Mix 15 units with Breakfast and 15 units with Supper  - Continue Ozempic  2 mg weekly      HOW TO TREAT LOW BLOOD SUGARS (Blood sugar LESS THAN 70 MG/DL) Please follow the RULE OF 15 for the treatment of hypoglycemia treatment (when your (blood sugars are less than 70 mg/dL)   STEP 1: Take 15 grams of carbohydrates when your blood sugar is low, which includes:  3-4 GLUCOSE TABS  OR 3-4 OZ OF JUICE OR REGULAR SODA OR ONE TUBE OF GLUCOSE GEL    STEP 2: RECHECK blood sugar in 15 MINUTES STEP 3: If your blood sugar is still low at the 15 minute recheck --> then, go back to STEP 1 and treat AGAIN with another 15 grams of carbohydrates.

## 2022-03-06 NOTE — Progress Notes (Signed)
Name: [name]  Age/ Sex: 81 y.o., female   MRN/ DOB: [id number], [birth year]     PCP: Georgina Quint, MD   Reason for [name] Evaluation: Type 2 Diabetes Mellitus  Initial Endocrine Consultative Visit: 06/21/2020    PATIENT IDENTIFIER: [name] is a 81 y.o. female with a past medical history of HTN, PAF, CHF, OSA and Dyslipidemia . The patient has followed with [name] since 06/21/2020 for consultative assistance with management of her diabetes.  DIABETIC HISTORY:  [name] was diagnosed with DM in 2011,she is intolerant to metformin due to diarrhea. Her hemoglobin A1c has ranged from 7.3% in 2011, peaking at 9.5% in 2014.   ADRENAL HISTORY: She was noted with an incidental finding of right adrenal adenoma ~ 4 cm on CT imaging and 09/2010 during evaluation of abdominal pain.  This has been stable on CT imaging of abdomen 03/2020  She had normal saliva cortisol 05/2021  She had normal normetanephrine and metanephrines 01/2022   Patient has primary hyperaldosteronism with an elevated Aldo/PRA ratio of 63.6, low renin activity at 0.11 (in the setting of ARB) and a normal level aldosterone of 7.  Would not proceed with invasive testing due to advanced age and cardiovascular history We have opted to treat medically with spironolactone which was started in May 2023  SUBJECTIVE:   During the last visit (10/04/2021): A1c 8.6 %, adjusted insulin and continued  Ozempic      Today (03/06/2022): [name] is here for follow-up on diabetes management.  She is accompanied by her best friend.  She checks her blood sugars multiple times a day  times daily, through CGM . The patient has  had hypoglycemic episodes since the last clinic visit, she self reduced   She is accompanied by her pastor  She is S/P cardioversion 10/18/2021 for A.Fib   Was seen by podiatry on 01/13/2022 Was evaluated by pulmonary 01/02/2022 for COPD and OSA  She is S/P  admission for CHF 01/2022  She has been SOB for the past 2 days, denies LE edema, weight stable   Denies nausea, vomiting or diarrhea   HOME ENDOCRINE REGIMEN:  Humalog Mix 25 units with Breakfast and Supper - self reduced to 17 units with breakfast and Supper  Ozempic 1 mg weekly  Spironolactone 25 mg daily        Statin: yes ACE-I/ARB: yes   CONTINUOUS GLUCOSE MONITORING RECORD INTERPRETATION    Dates of Recording: 5/5-5/18/2023  Sensor description:freestyle libre  Results statistics:   CGM use % of time 98  Average and SD 171/20.6  Time in range  66 %  % Time Above 180 30  % Time above 250 4  % Time Below target 0     Glycemic patterns summary: BG's optimal overnight but hyperglycemia is noted during the day   Hyperglycemic episodes  during the day   Hypoglycemic episodes occurred N/A  Overnight periods: optimal           DIABETIC COMPLICATIONS: Microvascular complications:  Neuropathy Denies: CKD, , retinopathy Last eye exam: Completed 2022   Macrovascular complications:    Denies: CAD, PVD, CVA    HISTORY:  Past Medical History:  Past Medical History:  Diagnosis Date   Arthritis    Back pain    Chronic anticoagulation    due to aflutter   Chronic kidney disease    Diabetes mellitus    Diastolic CHF, chronic (HCC)    a.  echo 2006 - ef  55-65%; mild diast dysfxn;    b. Echo 08/2011: Mild LVH, EF 60%;  c. 04/2013 Echo: EF 65-69%, mild conc LVH;  08/2014 Echo: EF 60-65%, mild-mod MR.   Gout    Hyperlipidemia    Hypertension    a.  Renal arterial Dopplers 12/2011: 1-59% right renal artery stenosis   Morbid obesity (HCC)    Obstructive sleep apnea on CPAP    Paroxysmal Afib/Flutter    a. 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   Past Surgical History:  Past Surgical History:  Procedure Laterality Date   APPENDECTOMY     ATRIAL FLUTTER ABLATION N/A 09/24/2011   Procedure: ATRIAL FLUTTER ABLATION;  Surgeon: Marinus Maw, MD;   [facility] C[facility] LAB;  Service: Cardiovascular;  Laterality: N/A;   CARDIOVERSION  10/22/2011   Procedure: CARDIOVERSION;  Surgeon: Duke Salvia, MD;  Location: [facility] OR;  Service: Cardiovascular;  Laterality: N/A;   CARDIOVERSION N/A 09/10/2011   Procedure: CARDIOVERSION;  Surgeon: Duke Salvia, MD;  Location: [facility] CATH LAB;  Service: Cardiovascular;  Laterality: N/A;   CHOLECYSTECTOMY     COLONOSCOPY WITH PROPOFOL N/A 06/13/2021   Procedure: COLONOSCOPY WITH PROPOFOL;  Surgeon: Rachael Fee, MD;  Location: [facility] ENDOSCOPY;  Service: Endoscopy;  Laterality: N/A;   POLYPECTOMY  06/13/2021   Procedure: POLYPECTOMY;  Surgeon: Rachael Fee, MD;  Location: [facility] ENDOSCOPY;  Service: Endoscopy;;   TONSILLECTOMY  1982   TOTAL ABDOMINAL HYSTERECTOMY     Social History:  reports that she has never smoked. She has never used smokeless tobacco. She reports that she does not drink alcohol and does not use drugs. Family History:  Family History  Problem Relation Age of Onset   Heart disease Father    Hypertension Father    Breast cancer Sister    Cancer Sister        breast   Colon cancer Neg Hx    Esophageal cancer Neg Hx    Pancreatic cancer Neg Hx    Liver disease Neg Hx      HOME MEDICATIONS: Allergies as of 03/06/2022       Reactions   Doxycycline    Made patient feel very ill    Metformin Diarrhea        Medication List        Accurate as of Mar 06, 2022  8:51 AM. If you have any questions, ask your nurse or doctor.          Accu-Chek Softclix Lancets lancets 1 each by Other route 3 (three) times daily. as directed   allopurinol 300 MG tablet Commonly known as: ZYLOPRIM TAKE 1 TABLET(300 MG) BY MOUTH DAILY   azelastine 0.1 % nasal spray Commonly known as: ASTELIN 1-2 puffs each nostril at bedtime as needed   b complex vitamins capsule Take 1 capsule by mouth daily.   blood glucose meter kit and supplies Dispense based on patient and insurance preference. Use up  to four times daily as directed. (FOR ICD-10 E10.9, E11.9).   Breztri Aerosphere 160-9-4.8 MCG/ACT Aero Generic drug: Budeson-Glycopyrrol-Formoterol USE 2 INHALATIONS BY MOUTH IN  THE MORNING AND AT BEDTIME   cholecalciferol 25 MCG (1000 UNIT) tablet Commonly known as: VITAMIN D3 Take 1,000 Units by mouth daily.   cloNIDine 0.3 MG tablet Commonly known as: CATAPRES TAKE 1 TABLET BY MOUTH  TWICE DAILY   diltiazem 30 MG tablet Commonly known as: CARDIZEM Take 1 tablet (30 mg total) by mouth See admin instructions. Take one tablet every  4 hours as needed for heart rate greater than 100   dronedarone 400 MG tablet Commonly known as: MULTAQ Take 1 tablet (400 mg total) by mouth 2 (two) times daily.   Eliquis 5 MG Tabs tablet Generic drug: apixaban TAKE 1 TABLET BY MOUTH  TWICE DAILY   FreeStyle Libre 14 Day Reader Hardie Pulley Use as directed.   FreeStyle Libre 14 Day Sensor Misc Use as directed to check blood sugar daily   Insulin Lispro Prot & Lispro (75-25) 100 UNIT/ML Kwikpen Commonly known as: HumaLOG Mix 75/25 KwikPen Inject 25 Units into the skin 2 (two) times daily. What changed: how much to take   ipratropium 17 MCG/ACT inhaler Commonly known as: ATROVENT HFA Inhale 2 puffs into the lungs every 6 (six) hours as needed for wheezing.   ketorolac 0.5 % ophthalmic solution Commonly known as: ACULAR SMARTSIG:In Eye(s)   levothyroxine 75 MCG tablet Commonly known as: SYNTHROID TAKE 1 TABLET BY MOUTH  DAILY BEFORE BREAKFAST   lidocaine 5 % Commonly known as: Lidoderm Place 1 patch onto the skin daily. Remove & Discard patch within 12 hours or as directed by MD   losartan 100 MG tablet Commonly known as: COZAAR Take 100 mg by mouth daily.   metoprolol tartrate 25 MG tablet Commonly known as: LOPRESSOR Take 1 tablet (25 mg total) by mouth 2 (two) times daily.   NovoFine Plus Pen Needle 32G X 4 MM Misc Generic drug: Insulin Pen Needle USE TWICE DAILY AS DIRECTED    OneTouch Verio test strip Generic drug: glucose blood 1 each by Other route 3 (three) times daily.   Ozempic (2 MG/DOSE) 8 MG/3ML Sopn Generic drug: Semaglutide (2 MG/DOSE) Inject 2 mg into the skin once a week.   potassium chloride 10 MEQ tablet Commonly known as: KLOR-CON Take 4 tablets (40 mEq total) by mouth daily.   Restasis MultiDose 0.05 % ophthalmic emulsion Generic drug: cycloSPORINE Place 1 drop into both eyes 2 (two) times daily.   rosuvastatin 10 MG tablet Commonly known as: CRESTOR TAKE 1 TABLET BY MOUTH IN  THE EVENING   spironolactone 25 MG tablet Commonly known as: ALDACTONE Take 1 tablet (25 mg total) by mouth daily.   TobraDex ophthalmic ointment Generic drug: tobramycin-dexamethasone SMARTSIG:Sparingly In Eye(s) Every Night   torsemide 20 MG tablet Commonly known as: DEMADEX Take 1 tablet (20 mg total) by mouth daily.   triamcinolone cream 0.1 % Commonly known as: KENALOG Apply 1 application topically 2 (two) times daily.   verapamil 120 MG tablet Commonly known as: Calan Take 1 tablet (120 mg total) by mouth in the morning.   vitamin B-12 1000 MCG tablet Commonly known as: CYANOCOBALAMIN Take 1,000 mcg by mouth daily.         OBJECTIVE:   Vital Signs: BP 116/70 (BP Location: Left Arm, Patient Position: Sitting, Cuff Size: Small)   Pulse 70   Ht 5\' 1"  (1.549 m)   Wt 250 lb (113.4 kg) Comment: patient reported  BMI 47.24 kg/m    Wt Readings from Last 3 Encounters:  02/28/22 249 lb (112.9 kg)  02/24/22 249 lb (112.9 kg)  02/11/22 243 lb (110.2 kg)     Exam: General: Pt appears well and is in NAD, in a wheelchair  Lungs: Clear with good BS bilat   Heart: RRR  Extremities: Trace  pretibial edema.   Neuro: MS is good with appropriate affect, pt is alert and Ox3    DM foot exam: 02/07/2022  The skin of the feet is intact without sores or ulcerations. The pedal pulses are 2+ on right and 2+ on left. The sensation is intact  to a screening 5.07, 10 gram monofilament bilaterally         DATA REVIEWED:  Lab Results  Component Value Date   HGBA1C 7.3 (A) 02/07/2022   HGBA1C 8.3 11/14/2021   HGBA1C 8.6 (H) 08/29/2021    Latest Reference Range & Units 02/07/22 10:47  Sodium 135 - 145 mEq/L 140  Potassium 3.5 - 5.1 mEq/L 3.6  Chloride 96 - 112 mEq/L 97  CO2 19 - 32 mEq/L 34 (H)  Glucose 70 - 99 mg/dL 161 (H)  BUN 6 - 23 mg/dL 15  Creatinine 0.96 - 0.45 mg/dL 4.09  Calcium 8.4 - 81.1 mg/dL 8.7    Latest Reference Range & Units 08/29/21 12:11  Total CHOL/HDL Ratio  2  Cholesterol 0 - 200 mg/dL 914  HDL Cholesterol >78.29 mg/dL 56.21  LDL (calc) 0 - 99 mg/dL 51  NonHDL  30.86  Triglycerides 0.0 - 149.0 mg/dL 578.4  VLDL 0.0 - 69.6 mg/dL 29.5      ASSESSMENT / PLAN / RECOMMENDATIONS:   1) Type 2 Diabetes Mellitus, optimally controlled, With Neuropathic  complications - Most recent A1c of 7.3 %. Goal A1c < 7.5 %.    -I have praised the patient on improved glycemic control  - Intolerant to Metformin  -No hypoglycemia -No changes   MEDICATIONS:  -Continue Humalog Mix 15 units with Breakfast and 15 units with Supper  -Continue Ozempic 2 mg weekly    EDUCATION / INSTRUCTIONS: BG monitoring instructions: Patient is instructed to check her blood sugars 3 times a day, before meals . Call [name] if: BG persistently < 70 I reviewed the Rule of 15 for the treatment of hypoglycemia in detail with the patient. Literature supplied.    2) Diabetic complications:  Eye: Does not have known diabetic retinopathy.  Neuro/ Feet: Does have known diabetic peripheral neuropathy .  Renal: Patient does not have known baseline CKD. She   is on an ACEI/ARB at present.   3) Right Adrenal Adenoma:   -She was diagnosed with right adrenal adenoma~4 cm on CT imaging from 2011, this has remained stable based on CT scan of abdomen dated 2021. -She had normal saliva cortisol testing 05/2021,  metanephrines and normetanephrine's -She did have suppressed renin in the setting of aldosterone >5 at 7 and elevated Aldo: Renin ratio    4) Primary Hyperaldosteronism:   -We have opted not to proceed with adrenal venous sampling no surgical intervention given advanced age and CHF and other cardiovascular history -Patient will be treated medically with spironolactone -She is currently on potassium as well as torsemide through cardiology -Today we discussed the pathophysiology of her condition as well as the mechanism of action of spironolactone, I did explain to her risk of hyperkalemia -She is currently scheduled to have lab appointment in 2 weeks to cardiology.  Patient -I am hoping that we can slowly titrate his spironolactone and be able to discontinue potassium   Medication Continue spironolactone 25 mg daily     F/U in 6 months    Signed electronically by: Lyndle Herrlich, MD  [name]  [facility] [address] [place] [place], [place] [address] Phone: [phone] FAX: [phone]   CC: Georgina Quint, MD [address] [place] [address] Phone: [phone]  Fax: [phone]  Return to [name]  as below: Future Appointments  Date Time Provider Department Center  03/06/2022 12:30 PM Monti Jilek, Konrad Dolores, MD [facility] None  03/12/2022  9:00 AM [facility] CCM PHARMACIST 2 [facility] None  03/13/2022 10:20 AM [facility] MM 3 [facility]  03/14/2022 11:00 AM [facility]  04/07/2022 10:30 AM [facility] SOCIAL WRK [facility] None  04/08/2022 10:30 AM [facility] CARE MGR [facility] None  04/16/2022  3:15 PM Freddie Breech, DPM [facility]  08/13/2022 10:30 AM Sriya Kroeze, Konrad Dolores, MD [facility] None  09/08/2022  2:30 PM Duke Salvia, MD [facility]

## 2022-03-07 ENCOUNTER — Encounter: Payer: Self-pay | Admitting: Internal Medicine

## 2022-03-11 ENCOUNTER — Telehealth: Payer: Self-pay | Admitting: Emergency Medicine

## 2022-03-11 ENCOUNTER — Other Ambulatory Visit: Payer: Self-pay | Admitting: Emergency Medicine

## 2022-03-11 DIAGNOSIS — E039 Hypothyroidism, unspecified: Secondary | ICD-10-CM

## 2022-03-11 NOTE — Telephone Encounter (Signed)
Time to have her thyroid tested.  Needs blood work.  Order placed for thyroid panel.  Call patient and have her come to our lab.  Thanks.

## 2022-03-11 NOTE — Telephone Encounter (Signed)
All last week and this week, pt has been feeling very tired and sleepy.   States that friends told her to have her thyroid checked to ensure proper functioning.   Pt requesting a call back with recommendations.

## 2022-03-11 NOTE — Telephone Encounter (Signed)
Pt scheduled for a lab appt on 5/25

## 2022-03-12 ENCOUNTER — Telehealth: Payer: Medicare Other

## 2022-03-12 NOTE — Progress Notes (Unsigned)
Chronic Care Management Pharmacy Note  03/12/2022 Name:  [name] MRN:  [id number] DOB:  10-Jun-1941  Summary: -Per patient she feels she has been doing well since most recent hospital admission -Denies any issues with SOB / cough lately - notes that she was prescribed breztri but did not start due to feeling she did not need / concern about possible side effects - has on hand, planning to take prn  -After latest hospital admission - amiodarone, losartan, and verapamil had been stopped upon discharge- patient started on lisinopril, metoprolol, and multaq - patient's lisinopril stopped due to cough -Patient monitoring BP and HR at home, typically averaging 130-140/60-70 with HR averaging 60-70 - denies any issues with palpitations -Patient reduced insulin dosage due to low blood sugars she was having - takes 15-20 units twice daily depending on her BG - if <130 does not use  - BG recently averaging 118-145  Recommendations/Changes made from today's visit: - recommending no changes to medications at this time, BP and HR has been stable / patient denies any symptoms / fluid accumulation -Advised for patient to reach out to pulmonology - as she is not having any issues with breathing, likely best option would be to have albuterol inhaler to have for prn use rather than breztri -Patient to continue monitoring BG 3-4 times daily, reach out to endocrine about uncontrolled BG / hypoglycemia   Subjective: [name] is an 81 y.o. year old female who is a primary patient of Sagardia, Eilleen Kempf, MD.  The CCM team was consulted for assistance with disease management and care coordination needs.    Engaged with patient by telephone for follow-up visit in response to provider referral for pharmacy case management and/or care coordination services.   Consent to Services:  The patient was given the following information about Chronic Care Management services today, agreed to services, and gave  verbal consent: 1. CCM service includes personalized support from designated clinical staff supervised by the primary care provider, including individualized plan of care and coordination with other care providers 2. 24/7 contact phone numbers for assistance for urgent and routine care needs. 3. Service will only be billed when office clinical staff spend 20 minutes or more in a month to coordinate care. 4. Only one practitioner may furnish and bill the service in a calendar month. 5.The patient may stop CCM services at any time (effective at the end of the month) by phone call to the office staff. 6. The patient will be responsible for cost sharing (co-pay) of up to 20% of the service fee (after annual deductible is met). Patient agreed to services and consent obtained.  Patient Care Team: Georgina Quint, MD as PCP - General (Internal Medicine) Duke Salvia, MD as PCP - Electrophysiology (Cardiology) Sherren Mocha, MD as Resident (Family Medicine) [name] Corner, RN as Case Manager Lea Walbert, Vinnie Level, [facility] as Pharmacist (Pharmacist) Aura Camps, MD as Consulting Physician (Ophthalmology) Soundra Pilon, LCSW as Social Worker (Licensed Clinical Social Worker)  Recent office visits: 02/24/2022 - Dr. Alvy Bimler - no changes to medications - f/u in 3 months 02/11/2022 - Dr. Alvy Bimler - no changes to medications  01/28/2022 - Dr. Jonny Ruiz - arm pain - voltaren gel prn    Recent consult visits: 03/06/2022 - Dr. Lonzo Cloud - Endocrinology - d/c potassium  - follow up in 6 months  02/28/2022 - Maxine Glenn PA-C - cardiology - no changes to medications  - f/u in 6 months  02/07/2022 -  Dr. Lonzo Cloud - Endocrinology - increase ozempic to 2mg  weekly  01/13/2022 - Dr. Eloy End - Podiatry  01/02/2022 - Dr. Maple Hudson - Pulmonology - atrovent as needed  - azelastine nasal spray prn   Hospital visits: 02/15/2022-02/18/2022 - Hospital Admission - SOB - possible PNA / CHF exacerbation - given IV lasix - discharged  on augmentin   Objective:  Lab Results  Component Value Date   CREATININE 0.75 02/28/2022   BUN 13 02/28/2022   GFR 75.10 02/07/2022   GFRNONAA 78 11/29/2020   GFRAA 90 11/29/2020   NA 141 02/28/2022   K 4.0 02/28/2022   CALCIUM 9.3 02/28/2022   CO2 28 02/28/2022   GLUCOSE 177 (H) 02/28/2022    Lab Results  Component Value Date/Time   HGBA1C 7.3 (A) 02/07/2022 10:15 AM   HGBA1C 8.3 11/14/2021 12:00 AM   HGBA1C 8.6 (H) 08/29/2021 12:11 PM   HGBA1C 7.8 (A) 05/24/2021 10:04 AM   HGBA1C 7.7 (H) 09/12/2020 11:59 AM   GFR 75.10 02/07/2022 10:47 AM   GFR 85.24 09/27/2021 02:08 PM   MICROALBUR 6.3 (H) 06/21/2020 10:53 AM   MICROALBUR 2.48 (H) 08/21/2009 09:13 PM    Last diabetic Eye exam:  No results found for: HMDIABEYEEXA  Last diabetic Foot exam:  No results found for: HMDIABFOOTEX   Lab Results  Component Value Date   CHOL 141 08/29/2021   HDL 65.10 08/29/2021   LDLCALC 51 08/29/2021   TRIG 127.0 08/29/2021   CHOLHDL 2 08/29/2021       Latest Ref Rng & Units 08/23/2021    9:32 AM 07/17/2021   11:27 AM 03/12/2021   11:52 AM  Hepatic Function  Total Protein 6.0 - 8.3 g/dL 7.2   7.8   7.0    Albumin 3.5 - 5.2 g/dL 4.0   3.9   4.2    AST 0 - 37 U/L 23   18   20     ALT 0 - 35 U/L 20   15   17     Alk Phosphatase 39 - 117 U/L 126   100   125    Total Bilirubin 0.2 - 1.2 mg/dL 0.6   0.5   0.4    Bilirubin, Direct 0.0 - 0.3 mg/dL  0.1       Lab Results  Component Value Date/Time   TSH 3.350 03/12/2021 11:52 AM   TSH 2.050 07/05/2020 10:07 AM   FREET4 1.43 02/21/2020 08:46 AM   FREET4 1.24 (H) 05/22/2016 09:45 AM       Latest Ref Rng & Units 12/06/2021    9:01 AM 08/23/2021    9:32 AM 07/17/2021   11:27 AM  CBC  WBC 3.4 - 10.8 x10E3/uL 12.5   11.3   11.2    Hemoglobin 11.1 - 15.9 g/dL 29.5   28.4   13.2    Hematocrit 34.0 - 46.6 % 33.6   36.5   37.1    Platelets 150 - 450 x10E3/uL 363   346.0   352.0      Lab Results  Component Value Date/Time   VD25OH  21.09 (L) 01/25/2021 10:16 AM   VD25OH 23.37 (L) 01/23/2021 03:40 PM    Clinical ASCVD: Yes  The 10-year ASCVD risk score (Arnett DK, et al., 2019) is: 42%   Values used to calculate the score:     Age: 58 years     Sex: Female     Is Non-Hispanic African American: No     Diabetic: Yes  Tobacco smoker: No     Systolic Blood Pressure: 116 mmHg     Is BP treated: Yes     HDL Cholesterol: 65.1 mg/dL     Total Cholesterol: 141 mg/dL       01/26/8118   14:78 AM 02/24/2022    1:13 PM 02/11/2022    1:22 PM  Depression screen PHQ 2/9  Decreased Interest 0 0 2  Down, Depressed, Hopeless 0 0 1  PHQ - 2 Score 0 0 3  Altered sleeping  1 1  Tired, decreased energy  1 2  Change in appetite  0 0  Feeling bad or failure about yourself   0 0  Trouble concentrating  0 0  Moving slowly or fidgety/restless  0 1  Suicidal thoughts  0 0  PHQ-9 Score  2 7    Social History   Tobacco Use  Smoking Status Never  Smokeless Tobacco Never   BP Readings from Last 3 Encounters:  03/06/22 116/70  02/28/22 118/68  02/24/22 126/64   Pulse Readings from Last 3 Encounters:  03/06/22 70  02/28/22 75  02/24/22 77   Wt Readings from Last 3 Encounters:  03/06/22 250 lb (113.4 kg)  02/28/22 249 lb (112.9 kg)  02/24/22 249 lb (112.9 kg)   BMI Readings from Last 3 Encounters:  03/06/22 47.24 kg/m  02/28/22 47.05 kg/m  02/24/22 47.05 kg/m    Assessment/Interventions: Review of patient past medical history, allergies, medications, health status, including review of consultants reports, laboratory and other test data, was performed as part of comprehensive evaluation and provision of chronic care management services.   SDOH:  (Social Determinants of Health) assessments and interventions performed: Yes  SDOH Screenings   Alcohol Screen: Low Risk    Last Alcohol Screening Score (AUDIT): 0  Depression (PHQ2-9): Low Risk    PHQ-2 Score: 0  Financial Resource Strain: Low Risk    Difficulty of  Paying Living Expenses: Not hard at all  Food Insecurity: No Food Insecurity   Worried About Programme researcher, broadcasting/film/video in the Last Year: Never true   Ran Out of Food in the Last Year: Never true  Housing: Low Risk    Last Housing Risk Score: 0  Physical Activity: Inactive   Days of Exercise per Week: 0 days   Minutes of Exercise per Session: 0 min  Social Connections: Press photographer of Communication with Friends and Family: More than three times a week   Frequency of Social Gatherings with Friends and Family: Never   Attends Religious Services: More than 4 times per year   Active Member of [employer] or Organizations: Yes   Attends Engineer, structural: More than 4 times per year   Marital Status: Married  Stress: No Stress Concern Present   Feeling of Stress : Not at all  Tobacco Use: Low Risk    Smoking Tobacco Use: Never   Smokeless Tobacco Use: Never   Passive Exposure: Not on file  Transportation Needs: No Transportation Needs   Lack of Transportation (Medical): No   Lack of Transportation (Non-Medical): No    CCM Care Plan  Allergies  Allergen Reactions   Doxycycline     Made patient feel very ill    Metformin Diarrhea    Medications Reviewed Today     Reviewed by Winifred Olive, CMA (Certified Medical Assistant) on 02/28/22 at 1117  Med List Status: <None>   Medication Order Taking? Sig Documenting Provider Last Dose Status  Informant  Accu-Chek Softclix Lancets lancets [id number] Yes 1 each by Other route 3 (three) times daily. as directed Janeece Agee, NP Taking Active Self           Med Note Vernard Gambles   Tue Mar 12, 2021 11:12 AM)    allopurinol (ZYLOPRIM) 300 MG tablet [id number] Yes TAKE 1 TABLET(300 MG) BY MOUTH DAILY Georgina Quint, MD Taking Active   apixaban (ELIQUIS) 5 MG TABS tablet [id number] Yes TAKE 1 TABLET BY MOUTH  TWICE DAILY Duke Salvia, MD Taking Active   azelastine (ASTELIN) 0.1 % nasal spray [id number] Yes  1-2 puffs each nostril at bedtime as needed Jetty Duhamel D, MD Taking Active   b complex vitamins capsule [id number] Yes Take 1 capsule by mouth daily. [provider] Taking Active Self  blood glucose meter kit and supplies [id number] Yes Dispense based on patient and insurance preference. Use up to four times daily as directed. (FOR ICD-10 E10.9, E11.9). Georgina Quint, MD Taking Active Self  BREZTRI AEROSPHERE 160-9-4.8 MCG/ACT AERO [id number] Yes USE 2 INHALATIONS BY MOUTH IN  THE MORNING AND AT BEDTIME Cobb, Ruby Cola, NP Taking Active   cholecalciferol (VITAMIN D3) 25 MCG (1000 UNIT) tablet [id number] Yes Take 1,000 Units by mouth daily. [provider] Taking Active Self  cloNIDine (CATAPRES) 0.3 MG tablet [id number] Yes TAKE 1 TABLET BY MOUTH  TWICE DAILY Sagardia, Eilleen Kempf, MD Taking Active   Continuous Blood Gluc Receiver (FREESTYLE LIBRE 14 DAY READER) [place] [id number] Yes Use as directed. Georgina Quint, MD Taking Active Self  Continuous Blood Gluc Sensor (FREESTYLE LIBRE [address] [place]) [place] [id number] Yes Use as directed to check blood sugar daily Georgina Quint, MD Taking Active Self  diltiazem (CARDIZEM) 30 MG tablet [id number] Yes Take 1 tablet (30 mg total) by mouth See admin instructions. Take one tablet every 4 hours as needed for heart rate greater than 100 Newman Nip, NP Taking Active Self           Med Note [facility], CARLOS A   Thu Oct 24, 2021  9:49 AM)    dronedarone (MULTAQ) 400 MG tablet [id number] Yes Take 1 tablet (400 mg total) by mouth 2 (two) times daily. Duke Salvia, MD Taking Active   Insulin Lispro Prot & Lispro (HUMALOG MIX 75/25 KWIKPEN) (75-25) 100 UNIT/ML Stephanie Coup [id number] Yes Inject 25 Units into the skin 2 (two) times daily.  Patient taking differently: Inject 15-20 Units into the skin 2 (two) times daily.   [name], Konrad Dolores, MD Taking Active Self           Med Note Henreitta Leber, Shireen Quan   Fri Feb 28, 2022 11:15 AM)    ipratropium (ATROVENT HFA) 17 MCG/ACT inhaler [id number] Yes Inhale 2 puffs into the lungs every 6 (six) hours as needed for wheezing. Waymon Budge, MD Taking Active   ketorolac Berkley Harvey) 0.5 % ophthalmic solution [id number] Yes SMARTSIG:In Eye(s) [provider] Taking Active   levothyroxine (SYNTHROID) 75 MCG tablet [id number] Yes TAKE 1 TABLET BY MOUTH  DAILY BEFORE BREAKFAST Georgina Quint, MD Taking Active   lidocaine (LIDODERM) 5 % [id number] Yes Place 1 patch onto the skin daily. Remove & Discard patch within 12 hours or as directed by MD Kathryne Hitch, MD Taking Active Self           Med Note Wyvonnia Dusky, Emi Belfast   Wed May 08, 2021 10:52 AM)    losartan (COZAAR)  100 MG tablet [id number] Yes Take 100 mg by mouth daily. [provider] Taking Active   metoprolol tartrate (LOPRESSOR) 25 MG tablet [id number] Yes Take 1 tablet (25 mg total) by mouth 2 (two) times daily. Georgina Quint, MD Taking Active   NOVOFINE PLUS PEN NEEDLE 32G X 4 MM MISC [id number] Yes USE TWICE DAILY AS DIRECTED Georgina Quint, MD Taking Active Self  [facility] VERIO test strip [id number] Yes 1 each by Other route 3 (three) times daily. [provider] Taking Active Self           Med Note Wyvonnia Dusky, Emi Belfast   Wed May 08, 2021 10:53 AM)    potassium chloride (KLOR-CON) 10 MEQ tablet [id number] Yes Take 4 tablets (40 mEq total) by mouth daily. Graciella Freer, PA-C Taking Active   RESTASIS MULTIDOSE 0.05 % ophthalmic emulsion [id number] Yes Place 1 drop into both eyes 2 (two) times daily.  [provider] Taking Active Self  rosuvastatin (CRESTOR) 10 MG tablet [id number] Yes TAKE 1 TABLET BY MOUTH IN  THE EVENING Sagardia, Eilleen Kempf, MD Taking Active Self  Semaglutide, 2 MG/DOSE, (OZEMPIC, 2 MG/DOSE,) 8 MG/3ML SOPN [id number] Yes Inject 2 mg into the skin once a week. [name], Konrad Dolores, MD Taking Active   spironolactone  (ALDACTONE) 25 MG tablet [id number] Yes Take 1 tablet (25 mg total) by mouth daily. [name], Konrad Dolores, MD Taking Active   TOBRADEX ophthalmic ointment [id number] Yes SMARTSIG:Sparingly In Eye(s) Every Night [provider] Taking Active   torsemide (DEMADEX) 20 MG tablet [id number] Yes Take 1 tablet (20 mg total) by mouth daily. Newman Nip, NP Taking Active Self  triamcinolone cream (KENALOG) 0.1 % [id number] Yes Apply 1 application topically 2 (two) times daily. Georgina Quint, MD Taking Active Self           Med Note Vernard Gambles   Tue Mar 12, 2021 11:12 AM)    verapamil (CALAN) 120 MG tablet [id number] Yes Take 1 tablet (120 mg total) by mouth in the morning. Duke Salvia, MD Taking Active Self  vitamin B-12 (CYANOCOBALAMIN) 1000 MCG tablet [id number] Yes Take 1,000 mcg by mouth daily. [provider] Taking Active Self            Patient Active Problem List   Diagnosis Date Noted   Primary hyperaldosteronism (HCC) 03/06/2022   Coronary artery disease involving native coronary artery of native heart without angina pectoris 02/12/2022   Adrenal adenoma, right 02/07/2022   Skin lesion of right arm 02/02/2022   Asthma 11/01/2021   Normocytic anemia 10/11/2021   Nodule of right lung 09/30/2021   Dysthymia 03/12/2021   Primary osteoarthritis involving multiple joints 03/01/2021   Recurrent falls 01/10/2021   Sensorineural hearing loss (SNHL), 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   Persistent atrial fibrillation (HCC) 08/12/2020   Type 2 diabetes mellitus with hyperglycemia, with long-term current use of insulin (HCC) 06/21/2020   Type 2 diabetes mellitus with diabetic polyneuropathy, with long-term current use of insulin (HCC) 06/21/2020   Mixed hyperlipidemia 06/21/2020   Uncontrolled hypertension 06/11/2020   Aortic atherosclerosis (HCC) 03/28/2020   Diabetic peripheral neuropathy (HCC) 12/26/2019   Gastroesophageal reflux disease without esophagitis  03/17/2019   Bilateral leg edema 02/16/2019   Varicose veins of both legs with edema 02/16/2019   Heart murmur 09/05/2018   Restrictive lung disease 02/22/2018   Dysgeusia 06/11/2017   Osteoarthritis of left knee 06/03/2017   NAFLD (nonalcoholic fatty liver disease) 67/89/3810   Osteoporosis 12/31/2015  Obesity 12/19/2015   Edema 10/18/2015   Acquired hypothyroidism 10/18/2015   Prolonged QT interval 10/03/2013   Long term (current) use of anticoagulants 06/09/2012   BENIGN NEOPLASM OF ADRENAL GLAND 11/25/2010   Chronic diastolic heart failure (HCC) 02/20/2010   DM 05/16/2009   GOUT 05/16/2009   OBESITY, MORBID 05/16/2009   Hypertension associated with diabetes (HCC) 05/16/2009   Paroxysmal atrial fibrillation (HCC) 05/16/2009   HYPERLIPIDEMIA 11/30/2008   Obstructive sleep apnea on CPAP 11/30/2008    Immunization History  Administered Date(s) Administered   Fluad Quad(high Dose 65+) 12/31/2015   Influenza Split 09/20/2011, 07/13/2012, 12/31/2015   Influenza, High Dose Seasonal PF 08/07/2016, 08/04/2017, 07/21/2018, 07/22/2019   Influenza,inj,Quad PF,6+ Mos 10/04/2013   Influenza,inj,quad, With Preservative 10/04/2013   Influenza-Unspecified 09/20/2011, 07/13/2012, 10/04/2013, 08/07/2016   Pneumococcal Conjugate-13 07/15/2012, 05/11/2014   Pneumococcal Polysaccharide-23 07/13/2012   Pneumococcal-Unspecified 07/15/2012   Zoster, Live 07/29/2012    Conditions to be addressed/monitored:  Hypertension, Hyperlipidemia, Diabetes, Atrial Fibrillation, Heart Failure, Hypothyroidism, Osteoarthritis, Gout, and Dry Eyes  There are no care plans that you recently modified to display for this patient.      Medication Assistance: None required.  Patient affirms current coverage meets needs.  Patient's preferred pharmacy is:  [facility] [phone] [address] - [place], [place] - [address] [place] [address] Phone: [phone] Fax:  [phone]  [facility]) - [place], [place] - [address] [facility] [address] [place] [address] [place] [address] Phone: [phone] Fax: [phone]  [id number] - [place], [place] - [address] AT [facility] [address] [place] [address] Phone: [phone] Fax: [phone]  [facility] ([facility] ) - [place], [place] - [address] [place] [address] Phone: [phone] Fax: [phone]   Uses pill box? Yes Pt endorses 90% compliance  Care Plan and Follow Up Patient Decision:  Patient agrees to Care Plan and Follow-up.  Plan: Telephone follow up appointment with care management team member scheduled for:  3 months and The patient has been provided with contact information for the care management team and has been advised to call with any health related questions or concerns.   Ellin Saba, PharmD Clinical Pharmacist, [facility]

## 2022-03-13 ENCOUNTER — Ambulatory Visit
Admission: RE | Admit: 2022-03-13 | Discharge: 2022-03-13 | Disposition: A | Discharge status: Home / Self Care | Payer: Medicare Other | Source: Ambulatory Visit | Attending: Emergency Medicine | Admitting: Emergency Medicine

## 2022-03-13 ENCOUNTER — Other Ambulatory Visit (INDEPENDENT_AMBULATORY_CARE_PROVIDER_SITE_OTHER): Payer: Medicare Other

## 2022-03-13 DIAGNOSIS — E039 Hypothyroidism, unspecified: Secondary | ICD-10-CM | POA: Diagnosis not present

## 2022-03-13 DIAGNOSIS — Z1231 Encounter for screening mammogram for malignant neoplasm of breast: Secondary | ICD-10-CM

## 2022-03-13 LAB — CBC WITH DIFFERENTIAL/PLATELET
Basophils Absolute: 0 10*3/uL (ref 0.0–0.1)
Basophils Relative: 0.3 % (ref 0.0–3.0)
Eosinophils Absolute: 0.3 10*3/uL (ref 0.0–0.7)
Eosinophils Relative: 3 % (ref 0.0–5.0)
HCT: 34 % — ABNORMAL LOW (ref 36.0–46.0)
Hemoglobin: 10.9 g/dL — ABNORMAL LOW (ref 12.0–15.0)
Lymphocytes Relative: 24.6 % (ref 12.0–46.0)
Lymphs Abs: 2.6 10*3/uL (ref 0.7–4.0)
MCHC: 31.9 g/dL (ref 30.0–36.0)
MCV: 80.2 fl (ref 78.0–100.0)
Monocytes Absolute: 1.3 10*3/uL — ABNORMAL HIGH (ref 0.1–1.0)
Monocytes Relative: 12.6 % — ABNORMAL HIGH (ref 3.0–12.0)
Neutro Abs: 6.3 10*3/uL (ref 1.4–7.7)
Neutrophils Relative %: 59.5 % (ref 43.0–77.0)
Platelets: 369 10*3/uL (ref 150.0–400.0)
RBC: 4.25 Mil/uL (ref 3.87–5.11)
RDW: 18.3 % — ABNORMAL HIGH (ref 11.5–15.5)
WBC: 10.6 10*3/uL — ABNORMAL HIGH (ref 4.0–10.5)

## 2022-03-14 ENCOUNTER — Other Ambulatory Visit: Payer: Medicare Other | Admitting: *Deleted

## 2022-03-14 DIAGNOSIS — E876 Hypokalemia: Secondary | ICD-10-CM | POA: Diagnosis not present

## 2022-03-14 LAB — BASIC METABOLIC PANEL
BUN/Creatinine Ratio: 17 (ref 12–28)
BUN: 14 mg/dL (ref 8–27)
CO2: 28 mmol/L (ref 20–29)
Calcium: 9.1 mg/dL (ref 8.7–10.3)
Chloride: 99 mmol/L (ref 96–106)
Creatinine, Ser: 0.84 mg/dL (ref 0.57–1.00)
Glucose: 148 mg/dL — ABNORMAL HIGH (ref 70–99)
Potassium: 4.4 mmol/L (ref 3.5–5.2)
Sodium: 140 mmol/L (ref 134–144)
eGFR: 71 mL/min/{1.73_m2} (ref >59)

## 2022-03-14 LAB — THYROID PANEL WITH TSH
Free Thyroxine Index: 2 (ref 1.2–4.9)
T3 Uptake Ratio: 30 % (ref 24–39)
T4, Total: 6.8 ug/dL (ref 4.5–12.0)
TSH: 13 u[IU]/mL — ABNORMAL HIGH (ref 0.450–4.500)

## 2022-03-18 ENCOUNTER — Ambulatory Visit: Payer: Medicare Other | Admitting: Licensed Clinical Social Worker

## 2022-03-18 ENCOUNTER — Other Ambulatory Visit: Payer: Self-pay | Admitting: Emergency Medicine

## 2022-03-18 DIAGNOSIS — I5032 Chronic diastolic (congestive) heart failure: Secondary | ICD-10-CM

## 2022-03-18 DIAGNOSIS — E039 Hypothyroidism, unspecified: Secondary | ICD-10-CM

## 2022-03-18 DIAGNOSIS — E1159 Type 2 diabetes mellitus with other circulatory complications: Secondary | ICD-10-CM

## 2022-03-18 DIAGNOSIS — Z741 Need for assistance with personal care: Secondary | ICD-10-CM

## 2022-03-18 DIAGNOSIS — Z719 Counseling, unspecified: Secondary | ICD-10-CM

## 2022-03-18 MED ORDER — LEVOTHYROXINE SODIUM 100 MCG PO TABS: 100.0000 ug | ORAL_TABLET | Freq: Every day | ORAL | 3 refills | Status: DC

## 2022-03-18 NOTE — Patient Instructions (Addendum)
Visit Information  Thank you for taking time to visit with me today. Please don't hesitate to contact me if I can be of assistance to you before our next scheduled telephone appointment.  Following are the goals we discussed today: Getting personal care services Task & activities to accomplish goals: Review personal care service information and provider list  Return calls from [facility] or call them directly if you have questions [phone] or [phone]    Our next appointment is by telephone on June 19th at 10:30  Please call the care guide team at [phone] if you need to cancel or reschedule your appointment.   If you are experiencing a Mental Health or [place] or need someone to talk to, please call 1-800-273-TALK (toll free, 24 hour hotline)   Casimer Lanius, [place] Licensed Clinical Social Worker [facility] Management  [place]  [phone]

## 2022-03-18 NOTE — Chronic Care Management (AMB) (Signed)
Chronic Care Management   Clinical Social Work Note  03/18/2022 Name: [name] MRN: [id number] DOB: [birth year]  [name] is a 81 y.o. year old female who is a primary care patient of Sagardia, [name] Bloomer, MD. The CCM team was consulted to assist the patient with chronic disease management and/or care coordination needs related to: Level of Care Concerns.   Engaged with patient by telephone for follow up visit in response to provider referral for social work chronic care management and care coordination services.   Consent to Services:  The patient was given information about Chronic Care Management services, agreed to services, and gave verbal consent prior to initiation of services.  Please see initial visit note for detailed documentation.   Patient agreed to services and consent obtained.   Summary:  Patient is making progress with getting personal care services. Contact information for [name] provided to patient during this encounter.    CCM LCSW collaborated with [name] today  to assist with meeting patient's needs.  .  See Care Plan below for interventions and patient self-care actives.  Recommendation: Patient may benefit from, and is in agreement to call [name] to schedule home assessment as all paperwork has been received and processed.   Follow up Plan: Patient would like continued follow-up from CCM LCSW.  per patient's request will follow up in 3 weeks.  Will call office if needed prior to next encounter.   Assessment: Review of patient past medical history, allergies, medications, and health status, including review of relevant consultants reports was performed today as part of a comprehensive evaluation and provision of chronic care management and care coordination services.     SDOH (Social Determinants of Health) assessments and interventions performed:    Advanced Directives Status: Not addressed in this encounter.  CCM  Care Plan Conditions to be addressed/monitored: ; Level of care concerns  Care Plan : LCSW Plan of Care  Updates made by [name] Cane, LCSW since 03/18/2022 12:00 AM     Problem: Quality of [name]      Goal: Quality of [name] Maintained   Start Date: 03/03/2022  This Visit's Progress: On track  Recent Progress: On track  Priority: High  Note:   Current Barriers:  Level of Care Concerns:Inability to perform ADL's independently  CSW Clinical Goal(s):  Patient  will patient will work with [name] to address needs related to ADL's  through collaboration with Clinical Social Worker, provider, and care team.   Interventions: 1:1 collaboration with primary care provider regarding development and update of comprehensive plan of care as evidenced by provider attestation and co-signature Inter-disciplinary care team collaboration (see longitudinal plan of care) Evaluation of current treatment plan related to  self management and patient's adherence to plan as established by provider  Level of Care Concerns in a patient with CHF, HTN, and DMII:  (Status: Goal on Track (progressing): YES.) Current level of care: home with other family or significant other(s): spouse Evaluation of patient's unmet needs in current living environment ADL's [place] (PCS) :(has medicaid and interested in services) Provided list of PCS agencies and what to expect with PCS process  Collaborated with [name] to verify application received/ processed.  Patient can call in to schedule assessment  Task & activities to accomplish goals: Review personal care service information and provider list  Return calls from [name] or call them directly if you have questions [phone] or [phone]  [name] Lanius, LCSW Licensed Clinical Social Worker [name] Management  [name]  [phone]

## 2022-03-18 NOTE — Progress Notes (Signed)
New prescription sent to pharmacy requested.  Thanks

## 2022-03-19 DIAGNOSIS — E1159 Type 2 diabetes mellitus with other circulatory complications: Secondary | ICD-10-CM | POA: Diagnosis not present

## 2022-03-19 DIAGNOSIS — Z794 Long term (current) use of insulin: Secondary | ICD-10-CM

## 2022-03-19 DIAGNOSIS — I509 Heart failure, unspecified: Secondary | ICD-10-CM

## 2022-03-19 DIAGNOSIS — I110 Hypertensive heart disease with heart failure: Secondary | ICD-10-CM | POA: Diagnosis not present

## 2022-03-21 ENCOUNTER — Ambulatory Visit: Payer: Medicare Other | Admitting: Family Medicine

## 2022-03-27 DIAGNOSIS — H43811 Vitreous degeneration, right eye: Secondary | ICD-10-CM | POA: Diagnosis not present

## 2022-03-27 DIAGNOSIS — H538 Other visual disturbances: Secondary | ICD-10-CM | POA: Diagnosis not present

## 2022-03-31 ENCOUNTER — Telehealth: Payer: Self-pay | Admitting: Internal Medicine

## 2022-03-31 ENCOUNTER — Other Ambulatory Visit: Payer: Self-pay | Admitting: Emergency Medicine

## 2022-03-31 DIAGNOSIS — I480 Paroxysmal atrial fibrillation: Secondary | ICD-10-CM

## 2022-03-31 MED ORDER — APIXABAN 5 MG PO TABS: 5.0000 mg | ORAL_TABLET | Freq: Two times a day (BID) | ORAL | 1 refills | Status: DC

## 2022-03-31 NOTE — Telephone Encounter (Signed)
*  STAT* If patient is at the pharmacy, call can be transferred to refill team.   1. Which medications need to be refilled? (please list name of each medication and dose if known) apixaban (ELIQUIS) 5 MG TABS tablet  2. Which pharmacy/location (including street and city if local pharmacy) is medication to be sent to? [place], [place] AT [place]  3. Do they need a 30 day or 90 day supply? 90 day   Patient only has 1 pill left.

## 2022-03-31 NOTE — Telephone Encounter (Signed)
Prescription refill request for Eliquis received. Indication: Afib  Last office visit:02/28/22 ([place])  Scr: 0.84 (03/14/22) Age: 81 Weight: 113.4kg  Appropriate dose and refill sent to requested pharmacy.

## 2022-04-03 ENCOUNTER — Other Ambulatory Visit: Payer: Self-pay | Admitting: Emergency Medicine

## 2022-04-04 DIAGNOSIS — R0683 Snoring: Secondary | ICD-10-CM | POA: Diagnosis not present

## 2022-04-04 DIAGNOSIS — G4733 Obstructive sleep apnea (adult) (pediatric): Secondary | ICD-10-CM | POA: Diagnosis not present

## 2022-04-04 DIAGNOSIS — I10 Essential (primary) hypertension: Secondary | ICD-10-CM | POA: Diagnosis not present

## 2022-04-06 ENCOUNTER — Other Ambulatory Visit: Payer: Self-pay | Admitting: Emergency Medicine

## 2022-04-06 DIAGNOSIS — E1165 Type 2 diabetes mellitus with hyperglycemia: Secondary | ICD-10-CM

## 2022-04-07 ENCOUNTER — Encounter: Payer: Self-pay | Admitting: Licensed Clinical Social Worker

## 2022-04-07 ENCOUNTER — Ambulatory Visit (INDEPENDENT_AMBULATORY_CARE_PROVIDER_SITE_OTHER): Payer: Medicare Other | Admitting: Licensed Clinical Social Worker

## 2022-04-07 DIAGNOSIS — Z741 Need for assistance with personal care: Secondary | ICD-10-CM

## 2022-04-07 DIAGNOSIS — I5032 Chronic diastolic (congestive) heart failure: Secondary | ICD-10-CM

## 2022-04-07 DIAGNOSIS — E1159 Type 2 diabetes mellitus with other circulatory complications: Secondary | ICD-10-CM

## 2022-04-07 NOTE — Patient Instructions (Signed)
Visit Information  Thank you for taking time to visit with me today. Please don't hesitate to contact me if I can be of assistance to you before our next scheduled telephone appointment.  Following are the goals we discussed today:  Task & activities to accomplish goals: Keep appointment June 23rd Return calls from [facility] or call them directly if you have questions [phone] or [place], LCSW Licensed Clinical Social Worker Dossie Arbour Management  [place]  [phone]   Our next appointment is by telephone on July 24th at 10:30  Please call the care guide team at [phone] if you need to cancel or reschedule your appointment.   If you are experiencing a Mental Health or [place] or need someone to talk to, please call 1-800-273-TALK (toll free, 24 hour hotline)   The patient verbalized understanding of instructions, educational materials, and care plan provided today and agreed to receive a mailed copy of patient instructions, educational materials, and care plan.

## 2022-04-07 NOTE — Chronic Care Management (AMB) (Signed)
  Chronic Care Management   Clinical Social Work Note  04/07/2022 Name: [name] MRN: [id number] DOB: 1941-08-25  [name] is a 81 y.o. year old female who is a primary care patient of Sagardia, Ines Bloomer, MD. The CCM team was consulted to assist the patient with chronic disease management and/or care coordination needs related to: Level of Care Concerns.   Engaged with patient by telephone for follow up visit in response to provider referral for social work chronic care management and care coordination services.   Consent to Services:  The patient was given information about Chronic Care Management services, agreed to services, and gave verbal consent prior to initiation of services.  Please see initial visit note for detailed documentation.   Patient agreed to services and consent obtained.   Summary: Assessed patient's current treatment, progress, coping skills, support system and barriers to care.  She is making progress with getting [name], She has an assessment scheduled June 23rd .  See Care Plan below for interventions and patient self-care actives.  Recommendation: Patient may benefit from, and is in agreement to keep in-home assessment appointment with [facility].   Follow up Plan: Patient would like continued follow-up from CCM LCSW.  per patient's request will follow up in 30 days.  Will call office if needed prior to next encounter.   Assessment: Review of patient past medical history, allergies, medications, and health status, including review of relevant consultants reports was performed today as part of a comprehensive evaluation and provision of chronic care management and care coordination services.     SDOH (Social Determinants of Health) assessments and interventions performed:    Advanced Directives Status: Not addressed in this encounter.  CCM Care Plan Conditions to be addressed/monitored: ; Level of care concerns  Care Plan  : LCSW Plan of Care  Updates made by Maurine Cane, LCSW since 04/07/2022 12:00 AM     Problem: Quality of Life      Goal: Quality of Life Maintained   Start Date: 03/03/2022  This Visit's Progress: On track  Recent Progress: On track  Priority: High  Note:   Current Barriers:  Level of Care Concerns:Inability to perform ADL's independently  CSW Clinical Goal(s):  Patient  will patient will work with [facility] to address needs related to ADL's  through collaboration with Clinical Social Worker, provider, and care team.   Interventions: 1:1 collaboration with primary care provider regarding development and update of comprehensive plan of care as evidenced by provider attestation and co-signature Inter-disciplinary care team collaboration (see longitudinal plan of care) Evaluation of current treatment plan related to  self management and patient's adherence to plan as established by provider  Level of Care Concerns in a patient with CHF, HTN, and DMII:  (Status: Goal on Track (progressing): YES.) Current level of care: home with other family or significant other(s): spouse Evaluation of patient's unmet needs in current living environment ADL's Collaborated with [facility] to verify application received/ processed.  PCS assessment scheduled June 23rd at 10:30  Task & activities to accomplish goals: Keep appointment June 23rd Return calls from [facility] or call them directly if you have questions [phone] or [place], [place] Licensed Clinical Social Worker Dossie Arbour Management  [place]  [phone]

## 2022-04-08 ENCOUNTER — Ambulatory Visit: Payer: Medicare Other | Admitting: *Deleted

## 2022-04-08 DIAGNOSIS — Z794 Long term (current) use of insulin: Secondary | ICD-10-CM

## 2022-04-08 DIAGNOSIS — I5032 Chronic diastolic (congestive) heart failure: Secondary | ICD-10-CM

## 2022-04-08 NOTE — Chronic Care Management (AMB) (Signed)
Chronic Care Management   CCM RN Visit Note  04/08/2022 Name: [name] MRN: [id number] DOB: February 09, 1941  Subjective: [name] is a 81 y.o. year old female who is a primary care patient of Sagardia, Ines Bloomer, MD. The care management team was consulted for assistance with disease management and care coordination needs.    Engaged with patient by telephone for follow up visit in response to provider referral for case management and/or care coordination services.   Consent to Services:  The patient was given information about Chronic Care Management services, agreed to services, and gave verbal consent prior to initiation of services.  Please see initial visit note for detailed documentation.  Patient agreed to services and verbal consent obtained.   Assessment: Review of patient past medical history, allergies, medications, health status, including review of consultants reports, laboratory and other test data, was performed as part of comprehensive evaluation and provision of chronic care management services.   SDOH (Social Determinants of Health) assessments and interventions performed:  SDOH Interventions    Flowsheet Row Most Recent Value  SDOH Interventions   Food Insecurity Interventions Intervention Not Indicated  [continues to deny food insecurity]  Housing Interventions Intervention Not Indicated  [continues to reside with spouse in single family home,  denies housing concerns]  Transportation Interventions Intervention Not Indicated  [Patient continues to drive self]     CCM Care Plan  Allergies  Allergen Reactions   Doxycycline     Made patient feel very ill    Metformin Diarrhea   Outpatient Encounter Medications as of 04/08/2022  Medication Sig   Accu-Chek Softclix Lancets lancets 1 each by Other route 3 (three) times daily. as directed   allopurinol (ZYLOPRIM) 300 MG tablet TAKE 1 TABLET(300 MG) BY MOUTH DAILY   apixaban (ELIQUIS) 5 MG TABS tablet Take  1 tablet (5 mg total) by mouth 2 (two) times daily.   azelastine (ASTELIN) 0.1 % nasal spray 1-2 puffs each nostril at bedtime as needed   b complex vitamins capsule Take 1 capsule by mouth daily.   blood glucose meter kit and supplies Dispense based on patient and insurance preference. Use up to four times daily as directed. (FOR ICD-10 E10.9, E11.9).   BREZTRI AEROSPHERE 160-9-4.8 MCG/ACT AERO USE 2 INHALATIONS BY MOUTH IN  THE MORNING AND AT BEDTIME   cholecalciferol (VITAMIN D3) 25 MCG (1000 UNIT) tablet Take 1,000 Units by mouth daily.   cloNIDine (CATAPRES) 0.3 MG tablet TAKE 1 TABLET BY MOUTH  TWICE DAILY   Continuous Blood Gluc Receiver (FREESTYLE LIBRE 14 DAY READER) DEVI Use as directed.   Continuous Blood Gluc Sensor (FREESTYLE LIBRE 2 SENSOR) MISC USE AS DIRECTED TO CHECK BLOOD  SUGAR DAILY   diltiazem (CARDIZEM) 30 MG tablet Take 1 tablet (30 mg total) by mouth See admin instructions. Take one tablet every 4 hours as needed for heart rate greater than 100   dronedarone (MULTAQ) 400 MG tablet Take 1 tablet (400 mg total) by mouth 2 (two) times daily.   Insulin Lispro Prot & Lispro (HUMALOG MIX 75/25 KWIKPEN) (75-25) 100 UNIT/ML Kwikpen Inject 25 Units into the skin 2 (two) times daily. (Patient taking differently: Inject 15-20 Units into the skin 2 (two) times daily.)   ipratropium (ATROVENT HFA) 17 MCG/ACT inhaler Inhale 2 puffs into the lungs every 6 (six) hours as needed for wheezing.   ketorolac (ACULAR) 0.5 % ophthalmic solution SMARTSIG:In Eye(s)   levothyroxine (SYNTHROID) 100 MCG tablet Take 1 tablet (100 mcg total) by  mouth daily.   lidocaine (LIDODERM) 5 % Place 1 patch onto the skin daily. Remove & Discard patch within 12 hours or as directed by MD   losartan (COZAAR) 100 MG tablet Take 100 mg by mouth daily.   metoprolol tartrate (LOPRESSOR) 25 MG tablet Take 1 tablet (25 mg total) by mouth 2 (two) times daily.   NOVOFINE PLUS PEN NEEDLE 32G X 4 MM MISC USE TWICE DAILY AS  DIRECTED   ONETOUCH VERIO test strip 1 each by Other route 3 (three) times daily.   RESTASIS MULTIDOSE 0.05 % ophthalmic emulsion Place 1 drop into both eyes 2 (two) times daily.    rosuvastatin (CRESTOR) 10 MG tablet TAKE 1 TABLET BY MOUTH IN  THE EVENING   Semaglutide, 2 MG/DOSE, (OZEMPIC, 2 MG/DOSE,) 8 MG/3ML SOPN Inject 2 mg into the skin once a week.   spironolactone (ALDACTONE) 25 MG tablet Take 1 tablet (25 mg total) by mouth daily.   TOBRADEX ophthalmic ointment SMARTSIG:Sparingly In Eye(s) Every Night   torsemide (DEMADEX) 20 MG tablet Take 1 tablet (20 mg total) by mouth daily.   triamcinolone cream (KENALOG) 0.1 % Apply 1 application topically 2 (two) times daily.   verapamil (CALAN) 120 MG tablet Take 1 tablet (120 mg total) by mouth in the morning.   vitamin B-12 (CYANOCOBALAMIN) 1000 MCG tablet Take 1,000 mcg by mouth daily.   [DISCONTINUED] potassium chloride (KLOR-CON) 10 MEQ tablet Take 4 tablets (40 mEq total) by mouth daily.   No facility-administered encounter medications on file as of 04/08/2022.   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   Persistent atrial fibrillation ([place]) 08/12/2020   Type 2 diabetes mellitus with hyperglycemia, with long-term current use of insulin ([place]) 06/21/2020   Type 2 diabetes mellitus with diabetic polyneuropathy, with long-term current use of insulin ([place]) 06/21/2020   Mixed hyperlipidemia 06/21/2020   Uncontrolled hypertension 06/11/2020    Aortic atherosclerosis ([place]) 03/28/2020   Diabetic peripheral neuropathy ([place]) 12/26/2019   Gastroesophageal reflux disease without esophagitis 03/17/2019   Bilateral leg edema 02/16/2019   Varicose veins of both legs with edema 02/16/2019   Heart murmur 09/05/2018   Restrictive lung disease 02/22/2018   Dysgeusia 06/11/2017   Osteoarthritis of left knee 06/03/2017   NAFLD (nonalcoholic fatty liver disease) 08/07/2016   Osteoporosis 12/31/2015   Obesity 12/19/2015   Edema 10/18/2015   Acquired hypothyroidism 10/18/2015   Prolonged QT interval 10/03/2013   Long term (current) use of anticoagulants 06/09/2012   BENIGN NEOPLASM OF ADRENAL GLAND 11/25/2010   Chronic diastolic heart failure ([place]) 02/20/2010   DM 05/16/2009   GOUT 05/16/2009   OBESITY, MORBID 05/16/2009   Hypertension associated with diabetes ([place]) 05/16/2009   Paroxysmal atrial fibrillation ([place]) 05/16/2009   HYPERLIPIDEMIA 11/30/2008   Obstructive sleep apnea on CPAP 11/30/2008   Conditions to be addressed/monitored:  CHF and DMII  Care Plan : RN Care Manager Plan of Care  Updates made by [place] Royal[place], RN since 04/08/2022 12:00 AM     Problem: Chronic Disease Management Needs  Priority: High     Long-Range Goal: Ongoing adherence to established care plan for long term chronic disease management   Start Date: 10/09/2021  Expected End Date: 10/09/2022  Priority: High  Note:   Current Barriers:  Chronic Disease Management support and education needs related to Atrial Fibrillation, CHF, and DMII Fragile state of health, multiple progressing chronic health conditions-- requires ongoing reinforcement/ support for self-health management of multiple co-morbidities Recent hospitalization October 11, 2021- October 20, 2021: A-Fib with RVR; reports was at [facility]- patient reports received cardioversion during hospitalization Recent hospitalization April 29- Feb 18, 2022 at "[place])  for acute respiratory failure/ CHF/ pneumonia- patient discharged home to self care; reports she spoke with hospital doctor as I advised on 02/17/22 about obtaining home health services for bath aide during her hospitalization, but "nothing came of it" 02/27/22: Patient is requesting PCP order today for home health services for a bath aide-- she tells me she forgot to ask Dr. Mitchel Honour about this when she had recent office visit on 02/24/22-- will make PCP aware of same 04/08/22- confirmed with patient she did not hear back from PCP about her request for home health services- she declines need at this point; states she has appointment on Friday 04/11/22 with [place] for personal care services initiation through her medicaid benefit 02/27/22: Patient tells me today that she wants to have personal care services through her medicaid benefit- states "my sister in [place] has the same medicaid as I do, and she has someone come in 3 times a week to help her with her bath and daily care:" explained to patient that medicaid benefits are different from state to state, as well as from plan-plan, and I encouraged her to contact her medicaid insurance provider to see if personal care services are included in her medicaid benefits: she states she will do; however, she tells me she will need assistance as she "doesn't always understand" what her insurance benefits for both medicare and medicaid "mean;"  will place CCM CSW referral to help patient navigate this possible option of her medicaid benefit 04/08/22: confirmed that patient spoke with CCM CSW after my last outreach when referral was placed; states she has appointment on Friday 04/11/22 with [place] for personal care services initiation through her medicaid benefit  RNCM Clinical Goal(s):  Patient will demonstrate ongoing health management independence as evidenced by adherence to established plan of care for CHF/ AF, and DMII        through collaboration with RN Care  manager, provider, and care team.   Interventions: 1:1 collaboration with primary care provider regarding development and update of comprehensive plan of care as evidenced by provider attestation and co-signature Inter-disciplinary care team collaboration (see longitudinal plan of care) Evaluation of current treatment plan related to  self management and patient's adherence to plan as established by provider Review of patient status, including review of consultants reports, relevant laboratory and other test results, and medications completed SDOH updated: no new/ unmet concerns identified Pain assessment updated: denies pain today Falls assessment updated: continues to deny new/ recent falls x 12 months- continues using walker "all the time;"  positive reinforcement provided with encouragement to continue efforts at fall prevention; previously provided education around fall risks/ prevention reinforced Medications discussed: reports continues to independently self-manage and denies current concerns/ issues/ questions around medications; endorses adherence to taking all medications as prescribed and is able to verbalize a good understanding of the purpose, dosing, and scheduling of her  medications Reviewed upcoming scheduled provider appointments: 04/16/22- podiatry; 05/12/22- CCM CSW; 05/29/22- PCP; patient confirms is aware of all and has plans to attend as scheduled Discussed plans with patient for ongoing care management follow up and provided patient with direct contact information for care management team     Heart Failure Interventions:  (Status: 04/08/22: Goal on Track (progressing): YES.)  Long Term Goal  Discussed importance of daily weight and advised patient to weigh and record daily Discussed the importance of keeping all appointments with provider Confirmed  patient continues monitoring/ recording daily weights at home: she reports consistent daily weights today at "247 lbs" since most  recent hospitalization/ discharge Confirmed patient has no current clinical concerns around swelling/ breathing issues: she tells me she is "feeling good" today Reinforced previously provided education around importance of daily weights at home as earliest indicator of fluid retention; sign/ symptoms yellow CHF zone, along with corresponding action plan; importance of daily assessment stressed  Confirmed patient continues monitoring and recording blood pressures at home weekly: she does not have specific values to review today, but tells me that "they have all been good" Confirmed patient continues trying to follow low sodium/ low cholesterol/ heart healthy diet Confirmed patient continues using CPAP as prescribed; reports using maintenance inhaler as prescribed; has not needed to use rescue inhaler recently  Diabetes:  (Status: 04/08/22- Goal on Track (progressing): YES.) Long Term Goal   Lab Results  Component Value Date   HGBA1C 7.3 (A) 02/07/2022  Counseled on importance of regular laboratory monitoring as prescribed;        Confirmed continues "trying" to follow carb-modified, low sugar diet; however, reinforced previously provided education around value of portion control; appropriate dietary choices in setting of of DMII Confirmed patient continues using her CGM, reviewed recent blood sugars at home ans she reports "they are all (fasting and post-prandial) are always between 130 and 150;" she reports a "few" isolated post-prandial values between "160-180" Reviewed recent endocrinology provider office visit 03/06/22- verbalizes good understanding of post-office visit instructions and denies questions  Confirmed she continues taking 15-17 Units Humalog long acting insulin as advised: today, reports taking primarily/ consistently "15 U" BID Confirmed patient has continued taking Ozempic q week as prescribed Confirmed with patient no recent low blood sugar readings; she is able to verbalize  accurate signs/ symptoms of same, as well as action plan for same; she will benefit from ongoing reinforcement, education, and support  Patient Goals/Self-Care Activities: As evidenced by review of EHR, collaboration with care team, and patient reporting during CCM RN CM outreach,   [name] will: Take medications as prescribed Attend all scheduled provider appointments Call pharmacy for medication refills Call provider office for new concerns or questions Continue to check fasting (first thing in the morning, before eating) and 2-hours AFTER eating blood sugars at home-- this will help your doctors know if your medications are working or if they need adjusting: we will review these values each time we talk over the phone  Your blood sugar goal for first thing in the morning before eating is between 110-130 Your blood sugar goal for 2-hours after eating is between 160-180 Continue to monitor and write down on paper your morning weights every day: if you gain more than 3 lbs overnight, or 5 lbs in one week: please call your cardiology provider for instructions as to what you should do-- the weight you reported today is: 247 lbs Watch for swelling in feet, ankles and legs every  day Wear the compression stockings that the cardiology staff recommended on days that you notice swelling in your feet/ ankles Continue to follow heart healthy, low salt, low cholesterol, carbohydrate-modified, low sugar diet Try to stay as active as possible, and please take effort to not rush-- rushing will increase your anxiety and may cause you to fall Keep up the great work preventing falls-- continue to use your cane and/ or walker as needed Continue to maintain communication with the social worker at Dr. Barry Brunner office that is helping you arrange your personal care services through your medicaid benefit      Plan: Telephone follow up appointment with care management team member scheduled for: Monday,  June 02, 2022 at 11:30 am The patient has been provided with contact information for the care management team and has been advised to call with any health related questions or concerns   [name], RN, BSN, [place] [phone]: direct office

## 2022-04-08 NOTE — Patient Instructions (Signed)
Visit Information  [name], thank you for taking time to talk with me today. Please don't hesitate to contact me if I can be of assistance to you before our next scheduled telephone appointment  Below are the goals we discussed today:  Patient Self-Care Activities: [name] will: Take medications as prescribed Attend all scheduled provider appointments Call pharmacy for medication refills Call provider office for new concerns or questions Continue to check fasting (first thing in the morning, before eating) and 2-hours AFTER eating blood sugars at home-- this will help your doctors know if your medications are working or if they need adjusting: we will review these values each time we talk over the phone  Your blood sugar goal for first thing in the morning before eating is between 110-130 Your blood sugar goal for 2-hours after eating is between 160-180 Continue to monitor and write down on paper your morning weights every day: if you gain more than 3 lbs overnight, or 5 lbs in one week: please call your cardiology provider for instructions as to what you should do-- the weight you reported today is: 247 lbs Watch for swelling in feet, ankles and legs every day Wear the compression stockings that the cardiology staff recommended on days that you notice swelling in your feet/ ankles Continue to follow heart healthy, low salt, low cholesterol, carbohydrate-modified, low sugar diet Try to stay as active as possible, and please take effort to not rush-- rushing will increase your anxiety and may cause you to fall Keep up the great work preventing falls-- continue to use your cane and/ or walker as needed Continue to maintain communication with the social worker at Dr. Barry Brunner office that is helping you arrange your personal care services through your medicaid benefit  Our next scheduled telephone follow up visit/ appointment is scheduled on: Monday, June 02, 2022 at 11:30 am- This is a  PHONE [place] appointment  If you need to cancel or re-schedule our visit, please call [phone] and our care guide team will be happy to assist you.   I look forward to hearing about your progress.   [name], RN, BSN, [place] [phone]: direct office  If you are experiencing a Mental Health or [place] or need someone to talk to, please  call the Suicide and Crisis Lifeline: 988 call the [place] National Suicide Prevention Lifeline: (225) 583-3515 or TTY: (319) 559-4235 TTY 7736067517) to talk to a trained counselor call 1-800-273-TALK (toll free, 24 hour hotline) call 911   The patient verbalized understanding of instructions, educational materials, and care plan provided today and agreed to receive a mailed copy of patient instructions, educational materials, and care plan.

## 2022-04-16 ENCOUNTER — Ambulatory Visit (INDEPENDENT_AMBULATORY_CARE_PROVIDER_SITE_OTHER): Payer: Medicare Other | Admitting: Podiatry

## 2022-04-16 ENCOUNTER — Encounter: Payer: Self-pay | Admitting: Podiatry

## 2022-04-16 DIAGNOSIS — B351 Tinea unguium: Secondary | ICD-10-CM | POA: Diagnosis not present

## 2022-04-16 DIAGNOSIS — E1142 Type 2 diabetes mellitus with diabetic polyneuropathy: Secondary | ICD-10-CM

## 2022-04-16 DIAGNOSIS — M79675 Pain in left toe(s): Secondary | ICD-10-CM

## 2022-04-16 DIAGNOSIS — M79674 Pain in right toe(s): Secondary | ICD-10-CM

## 2022-04-16 DIAGNOSIS — Z794 Long term (current) use of insulin: Secondary | ICD-10-CM

## 2022-04-18 DIAGNOSIS — E1159 Type 2 diabetes mellitus with other circulatory complications: Secondary | ICD-10-CM | POA: Diagnosis not present

## 2022-04-18 DIAGNOSIS — I509 Heart failure, unspecified: Secondary | ICD-10-CM

## 2022-04-18 DIAGNOSIS — Z794 Long term (current) use of insulin: Secondary | ICD-10-CM

## 2022-04-25 NOTE — Progress Notes (Signed)
  Subjective:  Patient ID: [name], female    DOB: 05-15-1941,  MRN: [id number]  [name] presents to clinic today for at risk foot care with history of diabetic neuropathy and painful thick toenails that are difficult to trim. Pain interferes with ambulation. Aggravating factors include wearing enclosed shoe gear. Pain is relieved with periodic professional debridement.  Patient states blood glucose was 137 mg/dl today.  Last A1c was in the 7% range.  New problem(s): None.   PCP is Horald Pollen, MD , and last visit was Feb 24, 2022.  Allergies  Allergen Reactions   Doxycycline     Made patient feel very ill    Metformin Diarrhea    Review of Systems: Negative except as noted in the HPI.  Objective: No changes noted in today's physical examination. General: Patient is a pleasant 81 y.o. female morbidly obese in NAD. AAO x 3.   Neurovascular Examination: CFT immediate b/l LE. Palpable DP/PT pulses b/l LE. Digital hair present b/l. Skin temperature gradient WNL b/l. No pain with calf compression b/l. No edema noted b/l. No cyanosis or clubbing noted b/l LE.  Pt has subjective symptoms of neuropathy. Protective sensation intact 5/5 intact bilaterally with 10g monofilament b/l. Vibratory sensation intact b/l.  Dermatological:  Pedal integument with normal turgor, texture and tone b/l LE. No open wounds b/l. No interdigital macerations b/l. Toenails 1-5 b/l elongated, thickened, discolored with subungual debris. +Tenderness with dorsal palpation of nailplates. No hyperkeratotic or porokeratotic lesions present.  Musculoskeletal:  Muscle strength 4/5 to all lower extremity muscle groups bilaterally. Using office transport chair on today's visit. No pain, crepitus or joint limitation noted with ROM bilateral LE. HAV with bunion deformity noted b/l LE. Hammertoe deformity noted 2-5 b/l.     Latest Ref Rng & Units 02/07/2022   10:15 AM 11/14/2021   12:00 AM 08/29/2021    12:11 PM 05/24/2021   10:04 AM  Hemoglobin A1C  Hemoglobin-A1c 4.0 - 5.6 % 7.3  8.3     8.6  7.8      This result is from an external source.   Assessment/Plan: 1. Pain due to onychomycosis of toenails of both feet   2. Type 2 diabetes mellitus with diabetic polyneuropathy, with long-term current use of insulin ([place])      -Patient was evaluated and treated. All patient's and/or POA's questions/concerns answered on today's visit. -Continue foot and shoe inspections daily. Monitor blood glucose per PCP/Endocrinologist's recommendations. -Patient to continue soft, supportive shoe gear daily. -Mycotic toenails 1-5 bilaterally were debrided in length and girth with sterile nail nippers and dremel without incident. -Patient/POA to call should there be question/concern in the interim.   Return in about 3 months (around 07/17/2022).  Marzetta Board, DPM

## 2022-04-27 ENCOUNTER — Other Ambulatory Visit: Payer: Self-pay | Admitting: Internal Medicine

## 2022-05-01 ENCOUNTER — Ambulatory Visit (INDEPENDENT_AMBULATORY_CARE_PROVIDER_SITE_OTHER): Payer: Medicare Other | Admitting: Family Medicine

## 2022-05-01 ENCOUNTER — Encounter: Payer: Self-pay | Admitting: Family Medicine

## 2022-05-01 VITALS — BP 118/72 | HR 64 | Temp 97.6°F | Ht 61.0 in | Wt 247.0 lb

## 2022-05-01 DIAGNOSIS — L282 Other prurigo: Secondary | ICD-10-CM

## 2022-05-01 MED ORDER — METHYLPREDNISOLONE ACETATE 40 MG/ML IJ SUSP
40.0000 mg | Freq: Once | INTRAMUSCULAR | Status: AC
  Administered 2022-05-01: 40 mg via INTRAMUSCULAR

## 2022-05-01 MED ORDER — FLUOCINONIDE EMULSIFIED BASE 0.05 % EX CREA: 1.0000 | TOPICAL_CREAM | Freq: Two times a day (BID) | CUTANEOUS | 0 refills | Status: DC

## 2022-05-01 MED ORDER — HYDROXYZINE HCL 10 MG PO TABS: 10.0000 mg | ORAL_TABLET | Freq: Three times a day (TID) | ORAL | 0 refills | Status: DC | PRN

## 2022-05-01 NOTE — Patient Instructions (Addendum)
You received a steroid injection in our office today.   I prescribed a topical medication to help with itching and an oral medication as well.   DO NOT use any over the counter medications.   The oral medication is called hydroxyzine and may cause drowsiness. Use caution when taking this medicine and make sure you are staying hydrated.   Cool compresses will also help with the itching.   Follow up if not improving in the next week.

## 2022-05-01 NOTE — Progress Notes (Signed)
 Subjective:     Patient ID: [name], female    DOB: 01/14/1941, 81 y.o.   MRN: [id number]  Chief Complaint  Patient presents with   Pruritis    Continuous itching on both shoulders and right knee since 7/3. States it feels like something is "walking" on her.  Has tried calamine lotion and cortizone cream and denies changing any detergents or soaps.    HPI Patient is in today for a 10-11 day hx of a worsening red, pruritic rash on her shoulders, right posterior upper arm and right anterior knee.  Denies any changes in medications, soaps, lotions, detergent.   States she has been applying calamine lotion, then cortizone cream, and also an antibiotic cream.   Denies fever, chills, dizziness, chest pain, palpitations, shortness of breath, abdominal pain, N/V/D, urinary symptoms, LE edema.    Health Maintenance Due  Topic Date Due   Hepatitis C Screening  Never done   TETANUS/TDAP  Never done   Zoster Vaccines- Shingrix (1 of 2) Never done   OPHTHALMOLOGY EXAM  04/18/2021   Diabetic kidney evaluation - Urine ACR  06/21/2021    Past Medical History:  Diagnosis Date   Arthritis    Back pain    Chronic anticoagulation    due to aflutter   Chronic kidney disease    Diabetes mellitus    Diastolic CHF, chronic (HCC)    a.  echo 2006 - ef 55-65%; mild diast dysfxn;    b. Echo 08/2011: Mild LVH, EF 60%;  c. 04/2013 Echo: EF 65-69%, mild conc LVH;  08/2014 Echo: EF 60-65%, mild-mod MR.   Gout    Hyperlipidemia    Hypertension    a.  Renal arterial Dopplers 12/2011: 1-59% right renal artery stenosis   Morbid obesity (HCC)    Obstructive sleep apnea on CPAP    Paroxysmal Afib/Flutter    a. 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    Past Surgical History:  Procedure Laterality Date   APPENDECTOMY     ATRIAL FLUTTER ABLATION N/A 09/24/2011   Procedure: ATRIAL FLUTTER ABLATION;  Surgeon: Gregg W Taylor, MD;  Location: [facility];  Service: Cardiovascular;  Laterality: N/A;    CARDIOVERSION  10/22/2011   Procedure: CARDIOVERSION;  Surgeon: Steven C Klein, MD;  Location: [facility] OR;  Service: Cardiovascular;  Laterality: N/A;   CARDIOVERSION N/A 09/10/2011   Procedure: CARDIOVERSION;  Surgeon: Steven C Klein, MD;  Location: [facility]ATH [facility];  Service: Cardiovascular;  Laterality: N/A;   CHOLECYSTECTOMY     COLONOSCOPY WITH PROPOFOL N/A 06/13/2021   Procedure: COLONOSCOPY WITH PROPOFOL;  Surgeon: Jacobs, Daniel P, MD;  Location: [facility];  Service: Endoscopy;  Laterality: N/A;   POLYPECTOMY  06/13/2021   Procedure: POLYPECTOMY;  Surgeon: Jacobs, Daniel P, MD;  Location: [facility];  Service: Endoscopy;;   TONSILLECTOMY  1982   TOTAL ABDOMINAL HYSTERECTOMY      Family History  Problem Relation Age of Onset   Heart disease Father    Hypertension Father    Breast cancer Sister    Cancer Sister        breast   Colon cancer Neg Hx    Esophageal cancer Neg Hx    Pancreatic cancer Neg Hx    Liver disease Neg Hx     Social History   Socioeconomic History   Marital status: Married    Spouse name: Not on file   Number of children: 3   Years of education: Not on file     Highest education level: Not on file  Occupational History   Occupation: DISABILITY/housewife    Employer: RETIRED  Tobacco Use   Smoking status: Never   Smokeless tobacco: Never  Vaping Use   Vaping Use: Never used  Substance and Sexual Activity   Alcohol use: No   Drug use: No   Sexual activity: Yes  Other Topics Concern   Not on file  Social History Narrative   Not on file   Social Determinants of Health   Financial Resource Strain: Low Risk  (11/08/2021)   Overall Financial Resource Strain (CARDIA)    Difficulty of Paying Living Expenses: Not hard at all  Food Insecurity: No Food Insecurity (04/08/2022)   Hunger Vital Sign    Worried About Running Out of Food in the Last Year: Never true    Ran Out of Food in the Last Year: Never true  Transportation Needs: No Transportation Needs  (04/08/2022)   PRAPARE - Transportation    Lack of Transportation (Medical): No    Lack of Transportation (Non-Medical): No  Physical Activity: Inactive (11/08/2021)   Exercise Vital Sign    Days of Exercise per Week: 0 days    Minutes of Exercise per Session: 0 min  Stress: No Stress Concern Present (11/08/2021)   [employer] - Occupational Stress Questionnaire    Feeling of Stress : Not at all  Social Connections: Socially Integrated (11/08/2021)   Social Connection and Isolation Panel [NHANES]    Frequency of Communication with Friends and Family: More than three times a week    Frequency of Social Gatherings with Friends and Family: Never    Attends Religious Services: More than 4 times per year    Active Member of Clubs or Organizations: Yes    Attends Club or Organization Meetings: More than 4 times per year    Marital Status: Married  Intimate Partner Violence: Not At Risk (11/08/2021)   Humiliation, Afraid, Rape, and Kick questionnaire    Fear of Current or Ex-Partner: No    Emotionally Abused: No    Physically Abused: No    Sexually Abused: No    Outpatient Medications Prior to Visit  Medication Sig Dispense Refill   Accu-Chek Softclix Lancets lancets 1 each by Other route 3 (three) times daily. as directed 100 each 3   allopurinol (ZYLOPRIM) 300 MG tablet TAKE 1 TABLET(300 MG) BY MOUTH DAILY 90 tablet 3   apixaban (ELIQUIS) 5 MG TABS tablet Take 1 tablet (5 mg total) by mouth 2 (two) times daily. 180 tablet 1   azelastine (ASTELIN) 0.1 % nasal spray 1-2 puffs each nostril at bedtime as needed 30 mL 12   b complex vitamins capsule Take 1 capsule by mouth daily.     blood glucose meter kit and supplies Dispense based on patient and insurance preference. Use up to four times daily as directed. (FOR ICD-10 E10.9, E11.9). 1 each 0   BREZTRI AEROSPHERE 160-9-4.8 MCG/ACT AERO USE 2 INHALATIONS BY MOUTH IN  THE MORNING AND AT BEDTIME 21.4 g 5    cholecalciferol (VITAMIN D3) 25 MCG (1000 UNIT) tablet Take 1,000 Units by mouth daily.     cloNIDine (CATAPRES) 0.3 MG tablet TAKE 1 TABLET BY MOUTH  TWICE DAILY 180 tablet 3   Continuous Blood Gluc Receiver (FREESTYLE LIBRE 14 DAY READER) DEVI Use as directed. 1 each 0   Continuous Blood Gluc Sensor (FREESTYLE LIBRE 2 SENSOR) MISC USE AS DIRECTED TO CHECK BLOOD  SUGAR DAILY 8   each 2   diltiazem (CARDIZEM) 30 MG tablet Take 1 tablet (30 mg total) by mouth See admin instructions. Take one tablet every 4 hours as needed for heart rate greater than 100 45 tablet 2   dronedarone (MULTAQ) 400 MG tablet Take 1 tablet (400 mg total) by mouth 2 (two) times daily. 180 tablet 3   Insulin Lispro Prot & Lispro (HUMALOG MIX 75/25 KWIKPEN) (75-25) 100 UNIT/ML Kwikpen Inject 25 Units into the skin 2 (two) times daily. (Patient taking differently: Inject 15-20 Units into the skin 2 (two) times daily.) 45 mL 4   ipratropium (ATROVENT HFA) 17 MCG/ACT inhaler Inhale 2 puffs into the lungs every 6 (six) hours as needed for wheezing. 1 each 2   ketorolac (ACULAR) 0.5 % ophthalmic solution SMARTSIG:In Eye(s)     levothyroxine (SYNTHROID) 100 MCG tablet Take 1 tablet (100 mcg total) by mouth daily. 90 tablet 3   lidocaine (LIDODERM) 5 % Place 1 patch onto the skin daily. Remove & Discard patch within 12 hours or as directed by MD 30 patch 0   losartan (COZAAR) 100 MG tablet Take 100 mg by mouth daily.     NOVOFINE PLUS PEN NEEDLE 32G X 4 MM MISC USE TWICE DAILY AS DIRECTED 100 each 11   ONETOUCH VERIO test strip 1 each by Other route 3 (three) times daily.     RESTASIS MULTIDOSE 0.05 % ophthalmic emulsion Place 1 drop into both eyes 2 (two) times daily.      rosuvastatin (CRESTOR) 10 MG tablet TAKE 1 TABLET BY MOUTH IN  THE EVENING 90 tablet 3   Semaglutide, 2 MG/DOSE, (OZEMPIC, 2 MG/DOSE,) 8 MG/3ML SOPN Inject 2 mg into the skin once a week. 9 mL 3   spironolactone (ALDACTONE) 25 MG tablet TAKE 1 TABLET BY MOUTH DAILY  100 tablet 2   TOBRADEX ophthalmic ointment SMARTSIG:Sparingly In Eye(s) Every Night     torsemide (DEMADEX) 20 MG tablet Take 1 tablet (20 mg total) by mouth daily.     triamcinolone cream (KENALOG) 0.1 % Apply 1 application topically 2 (two) times daily. 30 g 0   verapamil (CALAN) 120 MG tablet Take 1 tablet (120 mg total) by mouth in the morning. 180 tablet 3   vitamin B-12 (CYANOCOBALAMIN) 1000 MCG tablet Take 1,000 mcg by mouth daily.     metoprolol tartrate (LOPRESSOR) 25 MG tablet Take 1 tablet (25 mg total) by mouth 2 (two) times daily. 180 tablet 3   No facility-administered medications prior to visit.    Allergies  Allergen Reactions   Doxycycline     Made patient feel very ill    Metformin Diarrhea    ROS Pertinent positives and negatives in the history of present illness.      Objective:    Physical Exam Constitutional:      General: She is not in acute distress.    Appearance: She is not ill-appearing.  Cardiovascular:     Rate and Rhythm: Normal rate.  Pulmonary:     Effort: Pulmonary effort is normal.  Skin:    General: Skin is warm and dry.     Findings: Rash present.          Comments: Eczema type rash with excoriation.   Neurological:     General: No focal deficit present.     Mental Status: She is alert.  Psychiatric:        Mood and Affect: Mood normal.        Thought Content:   Thought content normal.     BP 118/72 (BP Location: Left Wrist, Patient Position: Sitting, Cuff Size: Large)   Pulse 64   Temp 97.6 F (36.4 C) (Temporal)   Ht 5' 1" (1.549 m)   Wt 247 lb (112 kg)   SpO2 100%   BMI 46.67 kg/m  Wt Readings from Last 3 Encounters:  05/01/22 247 lb (112 kg)  03/06/22 250 lb (113.4 kg)  02/28/22 249 lb (112.9 kg)       Assessment & Plan:   Problem List Items Addressed This Visit   None Visit Diagnoses     Pruritic rash    -  Primary   Relevant Medications   hydrOXYzine (ATARAX) 10 MG tablet   fluocinonide-emollient  (LIDEX-E) 0.05 % cream   methylPREDNISolone acetate (DEPO-MEDROL) injection 40 mg (Completed)   methylPREDNISolone acetate (DEPO-MEDROL) injection 40 mg (Completed)      Discussed eczema and possible contact dermatitis.  Depo-Medrol 80 mg IM injection given in office.  Hydroxyzine and Lidex -E prescribed  Follow up if not improving.   I am having [name] start on hydrOXYzine and fluocinonide-emollient. I am also having her maintain her Restasis MultiDose, Accu-Chek Softclix Lancets, triamcinolone cream, lidocaine, OneTouch Verio, NovoFine Plus Pen Needle, torsemide, verapamil, diltiazem, blood glucose meter kit and supplies, FreeStyle Libre 14 Day Reader, Insulin Lispro Prot & Lispro, cholecalciferol, b complex vitamins, vitamin B-12, allopurinol, Breztri Aerosphere, metoprolol tartrate, ipratropium, dronedarone, azelastine, ketorolac, losartan, TobraDex, cloNIDine, Ozempic (2 MG/DOSE), levothyroxine, apixaban, rosuvastatin, FreeStyle Libre 2 Sensor, and spironolactone. We administered methylPREDNISolone acetate and methylPREDNISolone acetate.  Meds ordered this encounter  Medications   hydrOXYzine (ATARAX) 10 MG tablet    Sig: Take 1 tablet (10 mg total) by mouth 3 (three) times daily as needed.    Dispense:  30 tablet    Refill:  0    Order Specific Question:   Supervising Provider    Answer:   Pricilla Holm A [[id number]]   fluocinonide-emollient (LIDEX-E) 0.05 % cream    Sig: Apply 1 Application topically 2 (two) times daily.    Dispense:  30 g    Refill:  0    Order Specific Question:   Supervising Provider    Answer:   Pricilla Holm A [[id number]]   methylPREDNISolone acetate (DEPO-MEDROL) injection 40 mg   methylPREDNISolone acetate (DEPO-MEDROL) injection 40 mg

## 2022-05-07 ENCOUNTER — Ambulatory Visit (INDEPENDENT_AMBULATORY_CARE_PROVIDER_SITE_OTHER): Payer: Medicare Other

## 2022-05-07 DIAGNOSIS — I7789 Other specified disorders of arteries and arterioles: Secondary | ICD-10-CM | POA: Diagnosis not present

## 2022-05-07 DIAGNOSIS — I2510 Atherosclerotic heart disease of native coronary artery without angina pectoris: Secondary | ICD-10-CM | POA: Diagnosis not present

## 2022-05-07 DIAGNOSIS — R0602 Shortness of breath: Secondary | ICD-10-CM | POA: Diagnosis not present

## 2022-05-07 DIAGNOSIS — J849 Interstitial pulmonary disease, unspecified: Secondary | ICD-10-CM | POA: Diagnosis not present

## 2022-05-07 DIAGNOSIS — I700 Atherosclerosis of aorta: Secondary | ICD-10-CM | POA: Diagnosis not present

## 2022-05-10 DIAGNOSIS — G4733 Obstructive sleep apnea (adult) (pediatric): Secondary | ICD-10-CM | POA: Diagnosis not present

## 2022-05-10 DIAGNOSIS — I10 Essential (primary) hypertension: Secondary | ICD-10-CM | POA: Diagnosis not present

## 2022-05-12 ENCOUNTER — Ambulatory Visit (INDEPENDENT_AMBULATORY_CARE_PROVIDER_SITE_OTHER): Payer: Medicare Other | Admitting: Licensed Clinical Social Worker

## 2022-05-12 DIAGNOSIS — E1159 Type 2 diabetes mellitus with other circulatory complications: Secondary | ICD-10-CM

## 2022-05-12 DIAGNOSIS — I5032 Chronic diastolic (congestive) heart failure: Secondary | ICD-10-CM

## 2022-05-12 NOTE — Chronic Care Management (AMB) (Cosign Needed Addendum)
Chronic Care Management   Clinical Social Work Note  05/12/2022 Name: [name] MRN: [id number] DOB: 1941-05-07  [name] is a 81 y.o. year old female who is a primary care patient of Sagardia, Ines Bloomer, MD. The CCM team was consulted to assist the patient with chronic disease management and/or care coordination needs related to: Level of Care Concerns.   Engaged with patient by telephone for follow up visit in response to provider referral for social work chronic care management and care coordination services.   Consent to Services:  The patient was given information about Chronic Care Management services, agreed to services, and gave verbal consent prior to initiation of services.  Please see initial visit note for detailed documentation.   Patient agreed to services and consent obtained.   Summary:  Patient continues to experience difficulty with locating a PCS agency..  See Care Plan below for interventions and patient self-care actives.  Recommendation: Patient may benefit from, and is in agreement to Call the agency identified during this encounter that is able to meet her needs.   Follow up Plan: Patient would like continued follow-up from CCM LCSW.  per patient's request will follow up in 1 week.  Will call office if needed prior to next encounter.   Assessment: Review of patient past medical history, allergies, medications, and health status, including review of relevant consultants reports was performed today as part of a comprehensive evaluation and provision of chronic care management and care coordination services.     SDOH (Social Determinants of Health) assessments and interventions performed:    Advanced Directives Status: Not addressed in this encounter.  CCM Care Plan Conditions to be addressed/monitored: ; Level of care concerns  Care Plan : LCSW Plan of Care  Updates made by Maurine Cane, LCSW since 05/12/2022 12:00 AM     Problem: Quality of  Life      Goal: Quality of Life Maintained   Start Date: 03/03/2022  This Visit's Progress: On track  Recent Progress: On track  Priority: High  Note:   Current Barriers:  Level of Care Concerns:Inability to perform ADL's independently  CSW Clinical Goal(s):  Patient  will patient will work with [facility] to address needs related to ADL's  through collaboration with Clinical Social Worker, provider, and care team.   Interventions: 1:1 collaboration with primary care provider regarding development and update of comprehensive plan of care as evidenced by provider attestation and co-signature Inter-disciplinary care team collaboration (see longitudinal plan of care) Evaluation of current treatment plan related to  self management and patient's adherence to plan as established by provider  Level of Care Concerns in a patient with CHF, HTN, and DMII:  (Status: Goal on Track (progressing): YES.) Current level of care: home with other family or significant other(s): spouse Evaluation of patient's unmet needs in current living environment ADL's [place] (PCS) :(collaborated for barriers with getting provider  per patient she has been approved for 30 to 35 hours per month) PCS assessment scheduled June 23rd at 10:30 Having difficulty locating [facility] provider ( assisted patient with calling agencies from the list) Located an agency to meeting patient's needs [facility] [place] [address] [phone]  Task & activities to accomplish goals: Return calls from [facility] or call them directly if you have questions [phone] or [phone]   Call [place] [phone] and ask for Gloriann Loan, LCSW Licensed Clinical Social  Worker Geophysical data processor [place]  [phone]

## 2022-05-12 NOTE — Patient Instructions (Addendum)
Visit Information  Thank you for taking time to visit with me today. Please don't hesitate to contact me if I can be of assistance to you before our next scheduled telephone appointment.  Following are the goals we discussed today: personal care support in the home Task & activities to accomplish goals: Return calls from [facility] or call them directly if you have questions [phone] or [phone]   Call [place]        [phone] and ask for [place] next appointment is by telephone on July 31st at 10:00  Please call the care guide team at [phone] if you need to cancel or reschedule your appointment.   If you are experiencing a Mental Health or [place] or need someone to talk to, please call 1-800-273-TALK (toll free, 24 hour hotline)   The patient verbalized understanding of instructions, educational materials, and care plan provided today and DECLINED offer to receive copy of patient instructions, educational materials, and care plan.   Casimer Lanius, LCSW Licensed Clinical Social Worker Care Coordination  [place]  [phone]

## 2022-05-13 ENCOUNTER — Encounter: Payer: Self-pay | Admitting: Emergency Medicine

## 2022-05-13 ENCOUNTER — Ambulatory Visit (INDEPENDENT_AMBULATORY_CARE_PROVIDER_SITE_OTHER): Payer: Medicare Other | Admitting: Emergency Medicine

## 2022-05-13 VITALS — BP 122/76 | HR 71 | Temp 97.8°F | Ht 61.0 in | Wt 248.0 lb

## 2022-05-13 DIAGNOSIS — E1165 Type 2 diabetes mellitus with hyperglycemia: Secondary | ICD-10-CM

## 2022-05-13 DIAGNOSIS — R21 Rash and other nonspecific skin eruption: Secondary | ICD-10-CM | POA: Diagnosis not present

## 2022-05-13 DIAGNOSIS — Z794 Long term (current) use of insulin: Secondary | ICD-10-CM | POA: Diagnosis not present

## 2022-05-13 LAB — POCT GLYCOSYLATED HEMOGLOBIN (HGB A1C): Hemoglobin A1C: 8.2 % — AB (ref 4.0–5.6)

## 2022-05-13 MED ORDER — PERMETHRIN 5 % EX CREA: 1.0000 | TOPICAL_CREAM | Freq: Once | CUTANEOUS | 0 refills | Status: AC

## 2022-05-13 NOTE — Progress Notes (Signed)
[name] 81 y.o.   Chief Complaint  Patient presents with   Rash    Rash on shoulders x 2 weeks ago     HISTORY OF PRESENT ILLNESS: Acute problem visit today. This is a 81 y.o. female complaining of itchy rash to upper and lower extremities for the past several weeks.  Was seen here by Mack Hook on 05/01/2022 and started on Atarax and Lidex cream with some relief but still having some residual rash and occasional itching.  Atarax making her sleepy. No other complaints or medical concerns today.   HPI   Prior to Admission medications   Medication Sig Start Date End Date Taking? Authorizing Provider  Accu-Chek Softclix Lancets lancets 1 each by Other route 3 (three) times daily. as directed 03/30/20   Maximiano Coss, NP  allopurinol (ZYLOPRIM) 300 MG tablet TAKE 1 TABLET(300 MG) BY MOUTH DAILY 10/18/21   Horald Pollen, MD  apixaban (ELIQUIS) 5 MG TABS tablet Take 1 tablet (5 mg total) by mouth 2 (two) times daily. 03/31/22   [name], MD  azelastine (ASTELIN) 0.1 % nasal spray 1-2 puffs each nostril at bedtime as needed 01/02/22   Baird Lyons D, MD  b complex vitamins capsule Take 1 capsule by mouth daily.    [provider]  blood glucose meter kit and supplies Dispense based on patient and insurance preference. Use up to four times daily as directed. (FOR ICD-10 E10.9, E11.9). 08/29/21   Horald Pollen, MD  BREZTRI AEROSPHERE 160-9-4.8 MCG/ACT AERO USE 2 INHALATIONS BY MOUTH IN  THE MORNING AND AT BEDTIME 11/12/21   Cobb, Karie Schwalbe, NP  cholecalciferol (VITAMIN D3) 25 MCG (1000 UNIT) tablet Take 1,000 Units by mouth daily.    [provider]  cloNIDine (CATAPRES) 0.3 MG tablet TAKE 1 TABLET BY MOUTH  TWICE DAILY 01/28/22   Horald Pollen, MD  Continuous Blood Gluc Receiver (FREESTYLE LIBRE 14 DAY READER) DEVI Use as directed. 08/29/21   Horald Pollen, MD  Continuous Blood Gluc Sensor (FREESTYLE LIBRE 2 SENSOR) MISC USE AS  DIRECTED TO CHECK BLOOD  SUGAR DAILY 04/06/22   Horald Pollen, MD  diltiazem (CARDIZEM) 30 MG tablet Take 1 tablet (30 mg total) by mouth See admin instructions. Take one tablet every 4 hours as needed for heart rate greater than 100 08/26/21   Sherran Needs, NP  dronedarone (MULTAQ) 400 MG tablet Take 1 tablet (400 mg total) by mouth 2 (two) times daily. 12/19/21   [name], MD  fluocinonide-emollient (LIDEX-E) 0.05 % cream Apply 1 Application topically 2 (two) times daily. 05/01/22   Henson, Vickie L, NP-C  hydrOXYzine (ATARAX) 10 MG tablet Take 1 tablet (10 mg total) by mouth 3 (three) times daily as needed. 05/01/22   Henson, Vickie L, NP-C  Insulin Lispro Prot & Lispro (HUMALOG MIX 75/25 KWIKPEN) (75-25) 100 UNIT/ML Kwikpen Inject 25 Units into the skin 2 (two) times daily. Patient taking differently: Inject 15-20 Units into the skin 2 (two) times daily. 10/04/21   [name], Melanie Crazier, MD  ipratropium (ATROVENT HFA) 17 MCG/ACT inhaler Inhale 2 puffs into the lungs every 6 (six) hours as needed for wheezing. 12/17/21 12/17/22  Deneise Lever, MD  ketorolac (ACULAR) 0.5 % ophthalmic solution SMARTSIG:In Eye(s) 08/22/21   [provider]  levothyroxine (SYNTHROID) 100 MCG tablet Take 1 tablet (100 mcg total) by mouth daily. 03/18/22   Horald Pollen, MD  lidocaine (LIDODERM) 5 % Place 1 patch  onto the skin daily. Remove & Discard patch within 12 hours or as directed by MD 08/29/20   Mcarthur Rossetti, MD  losartan (COZAAR) 100 MG tablet Take 100 mg by mouth daily. 12/28/21   [provider]  metoprolol tartrate (LOPRESSOR) 25 MG tablet Take 1 tablet (25 mg total) by mouth 2 (two) times daily. 12/11/21 03/11/22  Horald Pollen, MD  NOVOFINE PLUS PEN NEEDLE 32G X 4 MM MISC USE TWICE DAILY AS DIRECTED 04/08/21   Horald Pollen, MD  [facility] VERIO test strip 1 each by Other route 3 (three) times daily. 06/08/20   [provider]   RESTASIS MULTIDOSE 0.05 % ophthalmic emulsion Place 1 drop into both eyes 2 (two) times daily.  03/04/20   [provider]  rosuvastatin (CRESTOR) 10 MG tablet TAKE 1 TABLET BY MOUTH IN  THE EVENING 04/03/22   Sheriff Rodenberg, Ines Bloomer, MD  Semaglutide, 2 MG/DOSE, (OZEMPIC, 2 MG/DOSE,) 8 MG/3ML SOPN Inject 2 mg into the skin once a week. 02/07/22   [name], Melanie Crazier, MD  spironolactone (ALDACTONE) 25 MG tablet TAKE 1 TABLET BY MOUTH DAILY 04/28/22   [name], Melanie Crazier, MD  TOBRADEX ophthalmic ointment SMARTSIG:Sparingly In Eye(s) Every Night 08/25/21   [provider]  torsemide (DEMADEX) 20 MG tablet Take 1 tablet (20 mg total) by mouth daily. 05/30/21   Sherran Needs, NP  triamcinolone cream (KENALOG) 0.1 % Apply 1 application topically 2 (two) times daily. 07/26/20   Horald Pollen, MD  verapamil (CALAN) 120 MG tablet Take 1 tablet (120 mg total) by mouth in the morning. 08/16/21   [name], MD  vitamin B-12 (CYANOCOBALAMIN) 1000 MCG tablet Take 1,000 mcg by mouth daily.    [provider]  potassium chloride (KLOR-CON) 10 MEQ tablet Take 4 tablets (40 mEq total) by mouth daily. 12/10/21 03/06/22  Shirley Friar, PA-C    Allergies  Allergen Reactions   Doxycycline     Made patient feel very ill    Metformin Diarrhea    Patient Active Problem List   Diagnosis Date Noted   Primary hyperaldosteronism ([place]) 03/06/2022   Coronary artery disease involving native coronary artery of native heart without angina pectoris 02/12/2022   Adrenal adenoma, right 02/07/2022   Skin lesion of right arm 02/02/2022   Asthma 11/01/2021   Normocytic anemia 10/11/2021   Nodule of right lung 09/30/2021   Dysthymia 03/12/2021   Primary osteoarthritis involving multiple joints 03/01/2021   Recurrent falls 01/10/2021   Sensorineural hearing loss (SNHL), 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   Persistent atrial fibrillation ([place]) 08/12/2020   Type 2 diabetes  mellitus with hyperglycemia, with long-term current use of insulin ([place]) 06/21/2020   Type 2 diabetes mellitus with diabetic polyneuropathy, with long-term current use of insulin ([place]) 06/21/2020   Mixed hyperlipidemia 06/21/2020   Uncontrolled hypertension 06/11/2020   Aortic atherosclerosis ([employer]) 03/28/2020   Diabetic peripheral neuropathy ([place]) 12/26/2019   Gastroesophageal reflux disease without esophagitis 03/17/2019   Pruritic rash 03/17/2019   Bilateral leg edema 02/16/2019   Varicose veins of both legs with edema 02/16/2019   Heart murmur 09/05/2018   Restrictive lung disease 02/22/2018   Dysgeusia 06/11/2017   Osteoarthritis of left knee 06/03/2017   NAFLD (nonalcoholic fatty liver disease) 08/07/2016   Osteoporosis 12/31/2015   Obesity 12/19/2015   Edema 10/18/2015   Acquired hypothyroidism 10/18/2015   Prolonged QT interval 10/03/2013   Long term (current) use of anticoagulants 06/09/2012   BENIGN NEOPLASM OF ADRENAL GLAND  11/25/2010   Chronic diastolic heart failure ([place]) 02/20/2010   DM 05/16/2009   GOUT 05/16/2009   OBESITY, MORBID 05/16/2009   Hypertension associated with diabetes ([place]) 05/16/2009   Paroxysmal atrial fibrillation ([place]) 05/16/2009   HYPERLIPIDEMIA 11/30/2008   Obstructive sleep apnea on CPAP 11/30/2008    Past Medical History:  Diagnosis Date   Arthritis    Back pain    Chronic anticoagulation    due to aflutter   Chronic kidney disease    Diabetes mellitus    Diastolic CHF, chronic ([place])    a.  echo 2006 - ef 55-65%; mild diast dysfxn;    b. Echo 08/2011: Mild LVH, EF 60%;  c. 04/2013 Echo: EF 65-69%, mild conc LVH;  08/2014 Echo: EF 60-65%, mild-mod MR.   Gout    Hyperlipidemia    Hypertension    a.  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    Past Surgical History:  Procedure  Laterality Date   APPENDECTOMY     ATRIAL FLUTTER ABLATION N/A 09/24/2011   Procedure: ATRIAL FLUTTER ABLATION;  Surgeon: [name], MD;  [facility] LAB;  Service: Cardiovascular;  Laterality: N/A;   CARDIOVERSION  10/22/2011   Procedure: CARDIOVERSION;  Surgeon: [name], MD;  Location: [place];  Service: Cardiovascular;  Laterality: N/A;   CARDIOVERSION N/A 09/10/2011   Procedure: CARDIOVERSION;  Surgeon: [name], MD;  Location[facility] CATH LAB;  Service: Cardiovascular;  Laterality: N/A;   CHOLECYSTECTOMY     COLONOSCOPY WITH PROPOFOL N/A 06/13/2021   Procedure: COLONOSCOPY WITH PROPOFOL;  Surgeon: Milus Banister, MD;  Location: [facility] ENDOSCOPY;  Service: Endoscopy;  Laterality: N/A;   POLYPECTOMY  06/13/2021   Procedure: POLYPECTOMY;  Surgeon: Milus Banister, MD;  Location: [facility] ENDOSCOPY;  Service: Endoscopy;;   TONSILLECTOMY  1982   TOTAL ABDOMINAL HYSTERECTOMY      Social History   Socioeconomic History   Marital status: Married    Spouse name: Not on file   Number of children: 3   Years of education: Not on file   Highest education level: Not on file  Occupational History   Occupation: DISABILITY/housewife    Employer: RETIRED  Tobacco Use   Smoking status: Never   Smokeless tobacco: Never  Vaping Use   Vaping Use: Never used  Substance and Sexual Activity   Alcohol use: No   Drug use: No   Sexual activity: Yes  Other Topics Concern   Not on file  Social History Narrative   Not on file   Social Determinants of Health   Financial Resource Strain: Low Risk  (11/08/2021)   Overall Financial Resource Strain (CARDIA)    Difficulty of Paying Living Expenses: Not hard at all  Food Insecurity: No Food Insecurity (04/08/2022)   Hunger Vital Sign    Worried About Running Out of Food in the Last Year: Never true    Ran Out of Food in the Last Year: Never true  Transportation Needs: No Transportation Needs (04/08/2022)   PRAPARE - Radiographer, therapeutic (Medical): No    Lack of Transportation (Non-Medical): No  Physical Activity: Inactive (11/08/2021)   Exercise Vital Sign    Days of Exercise per Week: 0 days    Minutes of Exercise per Session: 0 min  Stress: No Stress Concern Present (11/08/2021)   [place]    Feeling of Stress : Not at all  Social Connections: [place] (11/08/2021)   Social Connection and Isolation Panel [NHANES]    Frequency of Communication with Friends and Family: More than three times a week    Frequency of Social Gatherings with Friends and Family: Never    Attends Religious Services: More than 4 times per year    Active Member of [employer] or Organizations: Yes    Attends Music therapist: More than 4 times per year    Marital Status: Married  Human resources officer Violence: Not At Risk (11/08/2021)   Humiliation, Afraid, Rape, and Kick questionnaire    Fear of Current or Ex-Partner: No    Emotionally Abused: No    Physically Abused: No    Sexually Abused: No    Family History  Problem Relation Age of Onset   Heart disease Father    Hypertension Father    Breast cancer Sister    Cancer Sister        breast   Colon cancer Neg Hx    Esophageal cancer Neg Hx    Pancreatic cancer Neg Hx    Liver disease Neg Hx      Review of Systems  Constitutional: Negative.  Negative for chills and fever.  HENT: Negative.  Negative for congestion and sore throat.   Respiratory: Negative.  Negative for cough and shortness of breath.   Cardiovascular: Negative.  Negative for chest pain and palpitations.  Gastrointestinal:  Negative for abdominal pain, diarrhea, nausea and vomiting.  Genitourinary: Negative.   Skin:  Positive for itching and rash.  Neurological:  Negative for dizziness and headaches.  All other systems reviewed and are negative.   Today's Vitals   05/13/22 0902  BP: 122/76  Pulse: 71  Temp: 97.8 F  (36.6 C)  TempSrc: Oral  SpO2: 98%  Weight: 248 lb (112.5 kg)  Height: _0  (1.549 m)   Body mass index is 46.86 kg/m. Wt Readings from Last 3 Encounters:  05/13/22 248 lb (112.5 kg)  05/01/22 247 lb (112 kg)  03/06/22 250 lb (113.4 kg)    Physical Exam Vitals reviewed.  Constitutional:      Appearance: Normal appearance. She is obese.  HENT:     Head: Normocephalic.  Eyes:     Extraocular Movements: Extraocular movements intact.     Pupils: Pupils are equal, round, and reactive to light.  Cardiovascular:     Rate and Rhythm: Normal rate and regular rhythm.     Pulses: Normal pulses.     Heart sounds: Normal heart sounds.  Pulmonary:     Effort: Pulmonary effort is normal.     Breath sounds: Normal breath sounds.  Musculoskeletal:     Cervical back: No tenderness.  Lymphadenopathy:     Cervical: No cervical adenopathy.  Skin:    General: Skin is warm and dry.     Capillary Refill: Capillary refill takes less than 2 seconds.     Comments: Maculopapular rash to both mid arm areas and right knee. No visible burrows on fingers or webspaces. Some of the lesions look like insect bites.  No signs of infection  Neurological:     General: No focal deficit present.     Mental Status: She is alert and oriented to person, place, and time.  Psychiatric:        Mood and  Affect: Mood normal.        Behavior: Behavior normal.      ASSESSMENT & PLAN: A total of 32 minutes was spent with the patient and counseling/coordination of care regarding preparing for this visit, review of most recent office visit notes, review of all medications, review of multiple chronic medical problems under management, differential diagnosis of skin rash and possibility of scabies and benefits from treatment, prognosis, documentation, and need for follow-up.  Problem List Items Addressed This Visit       Endocrine   Type 2 diabetes mellitus with hyperglycemia, with long-term current use of  insulin (HCC)   Relevant Orders   POCT HgB A1C (Completed)     Musculoskeletal and Integument   Rash and nonspecific skin eruption - Primary    Rash is better but still present. Scabies a possibility. We will treat with Elimite lotion today and repeat in 1 week. Advised to contact the office if no better or worse during the next several days.      Relevant Medications   permethrin (ELIMITE) 5 % cream   Patient Instructions  Rash, Adult  A rash is a change in the color of your skin. A rash can also change the way your skin feels. There are many different conditions and factors that can cause a rash. Follow these instructions at home: The goal of treatment is to stop the itching and keep the rash from spreading. Watch for any changes in your symptoms. Let your doctor know about them. Follow these instructions to help with your condition: Medicine Take or apply over-the-counter and prescription medicines only as told by your doctor. These may include medicines: To treat red or swollen skin (corticosteroid creams). To treat itching. To treat an allergy (oral antihistamines). To treat very bad symptoms (oral corticosteroids).  Skin care Put cool cloths (compresses) on the affected areas. Do not scratch or rub your skin. Avoid covering the rash. Make sure that the rash is exposed to air as much as possible. Managing itching and discomfort Avoid hot showers or baths. These can make itching worse. A cold shower may help. Try taking a bath with: Epsom salts. You can get these at your local pharmacy or grocery store. Follow the instructions on the package. Baking soda. Pour a small amount into the bath as told by your doctor. Colloidal oatmeal. You can get this at your local pharmacy or grocery store. Follow the instructions on the package. Try putting baking soda paste onto your skin. Stir water into baking soda until it gets like a paste. Try putting on a lotion that relieves itchiness  (calamine lotion). Keep cool and out of the sun. Sweating and being hot can make itching worse. General instructions  Rest as needed. Drink enough fluid to keep your pee (urine) pale yellow. Wear loose-fitting clothing. Avoid scented soaps, detergents, and perfumes. Use gentle soaps, detergents, perfumes, and other cosmetic products. Avoid anything that causes your rash. Keep a journal to help track what causes your rash. Write down: What you eat. What cosmetic products you use. What you drink. What you wear. This includes jewelry. Keep all follow-up visits as told by your doctor. This is important. Contact a doctor if: You sweat at night. You lose weight. You pee (urinate) more than normal. You pee less than normal, or you notice that your pee is a darker color than normal. You feel weak. You throw up (vomit). Your skin or the whites of your eyes look yellow (jaundice).  Your skin: Tingles. Is numb. Your rash: Does not go away after a few days. Gets worse. You are: More thirsty than normal. More tired than normal. You have: New symptoms. Pain in your belly (abdomen). A fever. Watery poop (diarrhea). Get help right away if: You have a fever and your symptoms suddenly get worse. You start to feel mixed up (confused). You have a very bad headache or a stiff neck. You have very bad joint pains or stiffness. You have jerky movements that you cannot control (seizure). Your rash covers all or most of your body. The rash may or may not be painful. You have blisters that: Are on top of the rash. Grow larger. Grow together. Are painful. Are inside your nose or mouth. You have a rash that: Looks like purple pinprick-sized spots all over your body. Has a "bull's eye" or looks like a target. Is red and painful, causes your skin to peel, and is not from being in the sun too long. Summary A rash is a change in the color of your skin. A rash can also change the way your skin  feels. The goal of treatment is to stop the itching and keep the rash from spreading. Take or apply over-the-counter and prescription medicines only as told by your doctor. Contact a doctor if you have new symptoms or symptoms that get worse. Keep all follow-up visits as told by your doctor. This is important. This information is not intended to replace advice given to you by your health care provider. Make sure you discuss any questions you have with your health care provider. Document Revised: 07/18/2021 Document Reviewed: 07/18/2021 Elsevier Patient Education  [place], MD [facility]

## 2022-05-13 NOTE — Patient Instructions (Signed)
Rash, Adult  A rash is a change in the color of your skin. A rash can also change the way your skin feels. There are many different conditions and factors that can cause a rash. Follow these instructions at home: The goal of treatment is to stop the itching and keep the rash from spreading. Watch for any changes in your symptoms. Let your doctor know about them. Follow these instructions to help with your condition: Medicine Take or apply over-the-counter and prescription medicines only as told by your doctor. These may include medicines: To treat red or swollen skin (corticosteroid creams). To treat itching. To treat an allergy (oral antihistamines). To treat very bad symptoms (oral corticosteroids).  Skin care Put cool cloths (compresses) on the affected areas. Do not scratch or rub your skin. Avoid covering the rash. Make sure that the rash is exposed to air as much as possible. Managing itching and discomfort Avoid hot showers or baths. These can make itching worse. A cold shower may help. Try taking a bath with: Epsom salts. You can get these at your local pharmacy or grocery store. Follow the instructions on the package. Baking soda. Pour a small amount into the bath as told by your doctor. Colloidal oatmeal. You can get this at your local pharmacy or grocery store. Follow the instructions on the package. Try putting baking soda paste onto your skin. Stir water into baking soda until it gets like a paste. Try putting on a lotion that relieves itchiness (calamine lotion). Keep cool and out of the sun. Sweating and being hot can make itching worse. General instructions  Rest as needed. Drink enough fluid to keep your pee (urine) pale yellow. Wear loose-fitting clothing. Avoid scented soaps, detergents, and perfumes. Use gentle soaps, detergents, perfumes, and other cosmetic products. Avoid anything that causes your rash. Keep a journal to help track what causes your rash. Write  down: What you eat. What cosmetic products you use. What you drink. What you wear. This includes jewelry. Keep all follow-up visits as told by your doctor. This is important. Contact a doctor if: You sweat at night. You lose weight. You pee (urinate) more than normal. You pee less than normal, or you notice that your pee is a darker color than normal. You feel weak. You throw up (vomit). Your skin or the whites of your eyes look yellow (jaundice). Your skin: Tingles. Is numb. Your rash: Does not go away after a few days. Gets worse. You are: More thirsty than normal. More tired than normal. You have: New symptoms. Pain in your belly (abdomen). A fever. Watery poop (diarrhea). Get help right away if: You have a fever and your symptoms suddenly get worse. You start to feel mixed up (confused). You have a very bad headache or a stiff neck. You have very bad joint pains or stiffness. You have jerky movements that you cannot control (seizure). Your rash covers all or most of your body. The rash may or may not be painful. You have blisters that: Are on top of the rash. Grow larger. Grow together. Are painful. Are inside your nose or mouth. You have a rash that: Looks like purple pinprick-sized spots all over your body. Has a "bull's eye" or looks like a target. Is red and painful, causes your skin to peel, and is not from being in the sun too long. Summary A rash is a change in the color of your skin. A rash can also change the way your skin   feels. The goal of treatment is to stop the itching and keep the rash from spreading. Take or apply over-the-counter and prescription medicines only as told by your doctor. Contact a doctor if you have new symptoms or symptoms that get worse. Keep all follow-up visits as told by your doctor. This is important. This information is not intended to replace advice given to you by your health care provider. Make sure you discuss any  questions you have with your health care provider. Document Revised: 07/18/2021 Document Reviewed: 07/18/2021 Elsevier Patient Education  2023 Elsevier Inc.  

## 2022-05-13 NOTE — Assessment & Plan Note (Signed)
Rash is better but still present. Scabies a possibility. We will treat with Elimite lotion today and repeat in 1 week. Advised to contact the office if no better or worse during the next several days.

## 2022-05-19 ENCOUNTER — Ambulatory Visit: Payer: Medicare Other | Admitting: Licensed Clinical Social Worker

## 2022-05-19 DIAGNOSIS — Z741 Need for assistance with personal care: Secondary | ICD-10-CM

## 2022-05-19 DIAGNOSIS — I152 Hypertension secondary to endocrine disorders: Secondary | ICD-10-CM

## 2022-05-19 DIAGNOSIS — I5032 Chronic diastolic (congestive) heart failure: Secondary | ICD-10-CM

## 2022-05-19 DIAGNOSIS — E1159 Type 2 diabetes mellitus with other circulatory complications: Secondary | ICD-10-CM

## 2022-05-19 DIAGNOSIS — Z794 Long term (current) use of insulin: Secondary | ICD-10-CM

## 2022-05-19 NOTE — Chronic Care Management (AMB) (Signed)
Care Management Clinical Social Work Note  05/19/2022 Name: [name] MRN: [id number] DOB: Aug 28, 1941  [name] is a 81 y.o. year old female who is a primary care patient of Sagardia, Ines Bloomer, MD.  The Care Management team was consulted for assistance with chronic disease management and coordination needs.  Engaged with patient by telephone for follow up visit in response to provider referral for social work chronic care management and care coordination services  Consent to Services:  [name] was given information about Care Management services today including:  Care Management services includes personalized support from designated clinical staff supervised by her physician, including individualized plan of care and coordination with other care providers 24/7 contact phone numbers for assistance for urgent and routine care needs. The patient may stop case management services at any time by phone call to the office staff.  Patient agreed to services and consent obtained.   Summary:  Patient is making progress with getting PCS set up. Home assessment scheduled for today .  See Care Plan below for interventions and patient self-care actives.  Recommendation: Patient may benefit from, and is in agreement  To call [place] [address] [phone]  with questions  Follow up Plan:  Patient would like continued follow-up from CCM LCSW.  per patient's request will follow up in 3 weeks under Care Coordination.  Will call office if needed prior to next encounter. Will follow up sooner if needed after assessment.   Assessment: Review of patient past medical history, allergies, medications, and health status, including review of relevant consultants reports was performed today as part of a comprehensive evaluation and provision of chronic care management and care coordination services.  SDOH (Social Determinants of Health) assessments and  interventions performed:    Advanced Directives Status: Not addressed in this encounter.  Care Plan Conditions to be addressed/monitored: ; Level of care concerns  Care Plan : LCSW Plan of Care  Updates made by [name] Cane, LCSW since 05/19/2022 12:00 AM  Completed 05/19/2022   Problem: Quality of Life Resolved 05/19/2022     Goal: Quality of Life Maintained Completed 05/19/2022  Start Date: 03/03/2022  This Visit's Progress: On track  Recent Progress: On track  Priority: High  Note:   Current Barriers:  Level of Care Concerns:Inability to perform ADL's independently  CSW Clinical Goal(s):  Patient  will patient will work with [facility] to address needs related to ADL's  through collaboration with Clinical Social Worker, provider, and care team.   Interventions: 1:1 collaboration with primary care provider regarding development and update of comprehensive plan of care as evidenced by provider attestation and co-signature Inter-disciplinary care team collaboration (see longitudinal plan of care) Evaluation of current treatment plan related to  self management and patient's adherence to plan as established by provider  Level of Care Concerns in a patient with CHF, HTN, and DMII:  (Status: Goal on Track (progressing): YES.) Current level of care: home with other family or significant other(s): spouse Evaluation of patient's unmet needs in current living environment ADL's [place] [facility] (PCS) :(collaborated for barriers with getting provider  per patient she has been approved for 30 to 35 hours per month) PCS assessment scheduled June 23rd at 10:30 Having difficulty locating [facility] provider ( assisted patient with calling agencies from the list) Located an agency to meeting patient's needs [facility] [place] [address] [phone] Has home assessment scheduled with above agency 05/19/22  Task & activities to accomplish goals: Return  calls from [facility] or call them directly if you have questions [phone] or [phone]   Call [place] [phone] and ask for Gloriann Loan, [place]  [phone]

## 2022-05-19 NOTE — Patient Instructions (Signed)
Visit Information  Thank you for taking time to visit with me today. Please don't hesitate to contact me if I can be of assistance to you before our next scheduled telephone appointment.  Following are the goals we discussed today:  [place]  Task & activities to accomplish goals: Return calls from [facility] or call them directly if you have questions [phone] or [phone]   Call [place]        [phone] and ask for [place] next appointment is by telephone on Aug. 21st at 10:00  Please call the care guide team at [phone] if you need to cancel or reschedule your appointment.   If you are experiencing a Mental Health or [place] or need someone to talk to, please call 1-800-273-TALK (toll free, 24 hour hotline)   The patient verbalized understanding of instructions, educational materials, and care plan provided today and DECLINED offer to receive copy of patient instructions, educational materials, and care plan.   Casimer Lanius, [place]  [phone]

## 2022-05-20 ENCOUNTER — Telehealth: Payer: Self-pay

## 2022-05-20 DIAGNOSIS — G4733 Obstructive sleep apnea (adult) (pediatric): Secondary | ICD-10-CM | POA: Diagnosis not present

## 2022-05-20 DIAGNOSIS — R0683 Snoring: Secondary | ICD-10-CM | POA: Diagnosis not present

## 2022-05-20 DIAGNOSIS — I10 Essential (primary) hypertension: Secondary | ICD-10-CM | POA: Diagnosis not present

## 2022-05-20 NOTE — Telephone Encounter (Signed)
Patient called states that she has called several pharmacies and none have the '2mg'$  ozempic. Informed patient that they will not be available for the next week or so according to company. She will stop by to get sample from office.

## 2022-05-21 NOTE — Telephone Encounter (Signed)
Patient came to pick up medication.

## 2022-05-27 ENCOUNTER — Ambulatory Visit: Payer: Medicare Other | Admitting: Internal Medicine

## 2022-05-29 ENCOUNTER — Ambulatory Visit (INDEPENDENT_AMBULATORY_CARE_PROVIDER_SITE_OTHER): Payer: Medicare Other | Admitting: Emergency Medicine

## 2022-05-29 ENCOUNTER — Encounter: Payer: Self-pay | Admitting: Emergency Medicine

## 2022-05-29 VITALS — BP 126/74 | HR 61 | Temp 97.9°F | Ht 61.0 in | Wt 249.2 lb

## 2022-05-29 DIAGNOSIS — L299 Pruritus, unspecified: Secondary | ICD-10-CM | POA: Diagnosis not present

## 2022-05-29 DIAGNOSIS — E039 Hypothyroidism, unspecified: Secondary | ICD-10-CM | POA: Diagnosis not present

## 2022-05-29 DIAGNOSIS — E1159 Type 2 diabetes mellitus with other circulatory complications: Secondary | ICD-10-CM | POA: Diagnosis not present

## 2022-05-29 DIAGNOSIS — I152 Hypertension secondary to endocrine disorders: Secondary | ICD-10-CM | POA: Diagnosis not present

## 2022-05-29 DIAGNOSIS — I480 Paroxysmal atrial fibrillation: Secondary | ICD-10-CM

## 2022-05-29 LAB — CBC WITH DIFFERENTIAL/PLATELET
Basophils Absolute: 0 10*3/uL (ref 0.0–0.1)
Basophils Relative: 0.3 % (ref 0.0–3.0)
Eosinophils Absolute: 0.3 10*3/uL (ref 0.0–0.7)
Eosinophils Relative: 2.2 % (ref 0.0–5.0)
HCT: 36.7 % (ref 36.0–46.0)
Hemoglobin: 11.8 g/dL — ABNORMAL LOW (ref 12.0–15.0)
Lymphocytes Relative: 16 % (ref 12.0–46.0)
Lymphs Abs: 2.4 10*3/uL (ref 0.7–4.0)
MCHC: 32.3 g/dL (ref 30.0–36.0)
MCV: 82.1 fl (ref 78.0–100.0)
Monocytes Absolute: 1.3 10*3/uL — ABNORMAL HIGH (ref 0.1–1.0)
Monocytes Relative: 8.8 % (ref 3.0–12.0)
Neutro Abs: 10.8 10*3/uL — ABNORMAL HIGH (ref 1.4–7.7)
Neutrophils Relative %: 72.7 % (ref 43.0–77.0)
Platelets: 348 10*3/uL (ref 150.0–400.0)
RBC: 4.47 Mil/uL (ref 3.87–5.11)
RDW: 19.9 % — ABNORMAL HIGH (ref 11.5–15.5)
WBC: 14.8 10*3/uL — ABNORMAL HIGH (ref 4.0–10.5)

## 2022-05-29 LAB — LIPID PANEL
Cholesterol: 136 mg/dL (ref 0–200)
HDL: 75.8 mg/dL (ref >39.00)
LDL Cholesterol: 44 mg/dL (ref 0–99)
NonHDL: 59.72
Total CHOL/HDL Ratio: 2
Triglycerides: 79 mg/dL (ref 0.0–149.0)
VLDL: 15.8 mg/dL (ref 0.0–40.0)

## 2022-05-29 LAB — COMPREHENSIVE METABOLIC PANEL
ALT: 18 U/L (ref 0–35)
AST: 15 U/L (ref 0–37)
Albumin: 3.8 g/dL (ref 3.5–5.2)
Alkaline Phosphatase: 110 U/L (ref 39–117)
BUN: 41 mg/dL — ABNORMAL HIGH (ref 6–23)
CO2: 31 mEq/L (ref 19–32)
Calcium: 9.1 mg/dL (ref 8.4–10.5)
Chloride: 95 mEq/L — ABNORMAL LOW (ref 96–112)
Creatinine, Ser: 1.16 mg/dL (ref 0.40–1.20)
GFR: 45.12 mL/min — ABNORMAL LOW (ref >60.00)
Glucose, Bld: 247 mg/dL — ABNORMAL HIGH (ref 70–99)
Potassium: 4.5 mEq/L (ref 3.5–5.1)
Sodium: 133 mEq/L — ABNORMAL LOW (ref 135–145)
Total Bilirubin: 0.5 mg/dL (ref 0.2–1.2)
Total Protein: 7.5 g/dL (ref 6.0–8.3)

## 2022-05-29 LAB — TSH: TSH: 0.84 u[IU]/mL (ref 0.35–5.50)

## 2022-05-29 MED ORDER — HYDROXYZINE HCL 25 MG PO TABS: 25.0000 mg | ORAL_TABLET | Freq: Three times a day (TID) | ORAL | 1 refills | Status: DC | PRN

## 2022-05-29 MED ORDER — GVOKE HYPOPEN 2-PACK 0.5 MG/0.1ML ~~LOC~~ SOAJ: 0.5000 mg | Freq: Every day | SUBCUTANEOUS | 5 refills | Status: DC | PRN

## 2022-05-29 NOTE — Assessment & Plan Note (Signed)
Well-controlled hypertension. BP Readings from Last 3 Encounters:  05/29/22 126/74  05/13/22 122/76  05/01/22 118/72  Continue losartan 100 mg daily, metoprolol tartrate 25 mg twice a day and Aldactone 25 mg daily.

## 2022-05-29 NOTE — Assessment & Plan Note (Signed)
Well-controlled.  Sinus rhythm today.

## 2022-05-29 NOTE — Assessment & Plan Note (Signed)
Presently active and affecting quality of life. May take Atarax 25 mg as needed for itching Dermatology referral placed today.

## 2022-05-29 NOTE — Progress Notes (Signed)
[name] 81 y.o.   Chief Complaint  Patient presents with   Follow-up    3 mnth f/u appt, pt requesting lab work     HISTORY OF PRESENT ILLNESS: This is a 81 y.o. female here for 70-monthfollow-up of chronic medical problems. Also still complaining of generalized itching.  Present treatments not working.  Needs referral to dermatology. Also requesting referral to nutritionist/dietitian. Has history of diabetes and sees endocrinologist on a regular basis. BP Readings from Last 3 Encounters:  05/29/22 126/74  05/13/22 122/76  05/01/22 118/72   Wt Readings from Last 3 Encounters:  05/29/22 249 lb 4 oz (113.1 kg)  05/13/22 248 lb (112.5 kg)  05/01/22 247 lb (112 kg)     HPI   Prior to Admission medications   Medication Sig Start Date End Date Taking? Authorizing Provider  Accu-Chek Softclix Lancets lancets 1 each by Other route 3 (three) times daily. as directed 03/30/20  Yes MMaximiano Coss NP  allopurinol (ZYLOPRIM) 300 MG tablet TAKE 1 TABLET(300 MG) BY MOUTH DAILY 10/18/21  Yes Oval Cavazos, MInes Bloomer MD  apixaban (ELIQUIS) 5 MG TABS tablet Take 1 tablet (5 mg total) by mouth 2 (two) times daily. 03/31/22  Yes KDeboraha Sprang MD  azelastine (ASTELIN) 0.1 % nasal spray 1-2 puffs each nostril at bedtime as needed 01/02/22  Yes Young, CTarri FullerD, MD  b complex vitamins capsule Take 1 capsule by mouth daily.   Yes [provider]  blood glucose meter kit and supplies Dispense based on patient and insurance preference. Use up to four times daily as directed. (FOR ICD-10 E10.9, E11.9). 08/29/21  Yes Batoul Limes, MInes Bloomer MD  BREZTRI AEROSPHERE 160-9-4.8 MCG/ACT AERO USE 2 INHALATIONS BY MOUTH IN  THE MORNING AND AT BEDTIME 11/12/21  Yes Cobb, KKarie Schwalbe NP  cholecalciferol (VITAMIN D3) 25 MCG (1000 UNIT) tablet Take 1,000 Units by mouth daily.   Yes [provider]  cloNIDine (CATAPRES) 0.3 MG tablet TAKE 1 TABLET BY MOUTH  TWICE DAILY 01/28/22  Yes Stclair Szymborski,  MInes Bloomer MD  Continuous Blood Gluc Receiver (FREESTYLE LIBRE 14 DAY READER) DEVI Use as directed. 08/29/21  Yes Carleigh Buccieri, MInes Bloomer MD  Continuous Blood Gluc Sensor (FREESTYLE LIBRE 2 SENSOR) MISC USE AS DIRECTED TO CHECK BLOOD  SUGAR DAILY 04/06/22  Yes Thorvald Orsino, MInes Bloomer MD  diltiazem (CARDIZEM) 30 MG tablet Take 1 tablet (30 mg total) by mouth See admin instructions. Take one tablet every 4 hours as needed for heart rate greater than 100 08/26/21  Yes CSherran Needs NP  dronedarone (MULTAQ) 400 MG tablet Take 1 tablet (400 mg total) by mouth 2 (two) times daily. 12/19/21  Yes KDeboraha Sprang MD  fluocinonide-emollient (LIDEX-E) 0.05 % cream Apply 1 Application topically 2 (two) times daily. 05/01/22  Yes Henson, Vickie L, NP-C  hydrOXYzine (ATARAX) 10 MG tablet Take 1 tablet (10 mg total) by mouth 3 (three) times daily as needed. 05/01/22  Yes Henson, Vickie L, NP-C  Insulin Lispro Prot & Lispro (HUMALOG MIX 75/25 KWIKPEN) (75-25) 100 UNIT/ML Kwikpen Inject 25 Units into the skin 2 (two) times daily. Patient taking differently: Inject 15-20 Units into the skin 2 (two) times daily. 10/04/21  Yes [name], IMelanie Crazier MD  ipratropium (ATROVENT HFA) 17 MCG/ACT inhaler Inhale 2 puffs into the lungs every 6 (six) hours as needed for wheezing. 12/17/21 12/17/22 Yes YBaird LyonsD, MD  ketorolac (ACULAR) 0.5 % ophthalmic solution SMARTSIG:In Eye(s) 08/22/21  Yes [provider]  levothyroxine (  SYNTHROID) 100 MCG tablet Take 1 tablet (100 mcg total) by mouth daily. 03/18/22  Yes Martita Brumm, Ines Bloomer, MD  lidocaine (LIDODERM) 5 % Place 1 patch onto the skin daily. Remove & Discard patch within 12 hours or as directed by MD 08/29/20  Yes Mcarthur Rossetti, MD  losartan (COZAAR) 100 MG tablet Take 100 mg by mouth daily. 12/28/21  Yes [provider]  NOVOFINE PLUS PEN NEEDLE 32G X 4 MM MISC USE TWICE DAILY AS DIRECTED 04/08/21  Yes Ketzaly Cardella, Ines Bloomer, MD  [facility] VERIO  test strip 1 each by Other route 3 (three) times daily. 06/08/20  Yes [provider]  RESTASIS MULTIDOSE 0.05 % ophthalmic emulsion Place 1 drop into both eyes 2 (two) times daily.  03/04/20  Yes [provider]  rosuvastatin (CRESTOR) 10 MG tablet TAKE 1 TABLET BY MOUTH IN  THE EVENING 04/03/22  Yes Azaliyah Kennard, [place], MD  Semaglutide, 2 MG/DOSE, (OZEMPIC, 2 MG/DOSE,) 8 MG/3ML SOPN Inject 2 mg into the skin once a week. 02/07/22  Yes [name], Melanie Crazier, MD  spironolactone (ALDACTONE) 25 MG tablet TAKE 1 TABLET BY MOUTH DAILY 04/28/22  Yes [name], Melanie Crazier, MD  TOBRADEX ophthalmic ointment SMARTSIG:Sparingly In Eye(s) Every Night 08/25/21  Yes [provider]  torsemide (DEMADEX) 20 MG tablet Take 1 tablet (20 mg total) by mouth daily. 05/30/21  Yes Sherran Needs, NP  triamcinolone cream (KENALOG) 0.1 % Apply 1 application topically 2 (two) times daily. 07/26/20  Yes Mel Tadros, Ines Bloomer, MD  verapamil (CALAN) 120 MG tablet Take 1 tablet (120 mg total) by mouth in the morning. 08/16/21  Yes [name], MD  vitamin B-12 (CYANOCOBALAMIN) 1000 MCG tablet Take 1,000 mcg by mouth daily.   Yes [provider]  metoprolol tartrate (LOPRESSOR) 25 MG tablet Take 1 tablet (25 mg total) by mouth 2 (two) times daily. 12/11/21 03/11/22  Horald Pollen, MD  potassium chloride (KLOR-CON) 10 MEQ tablet Take 4 tablets (40 mEq total) by mouth daily. 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   Coronary artery disease involving native coronary artery of native heart without angina pectoris 02/12/2022   Adrenal adenoma, right 02/07/2022   Skin lesion of right arm 02/02/2022   Asthma 11/01/2021   Normocytic anemia 10/11/2021   Nodule of right  lung 09/30/2021   Dysthymia 03/12/2021   Primary osteoarthritis involving multiple joints 03/01/2021   Recurrent falls 01/10/2021   Sensorineural hearing loss (SNHL), 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   Persistent atrial fibrillation ([place]) 08/12/2020   Type 2 diabetes mellitus with hyperglycemia, with long-term current use of insulin ([place]) 06/21/2020   Type 2 diabetes mellitus with diabetic polyneuropathy, with long-term current use of insulin ([place]) 06/21/2020   Mixed hyperlipidemia 06/21/2020   Uncontrolled hypertension 06/11/2020   Aortic atherosclerosis ([place]) 03/28/2020   Diabetic peripheral neuropathy ([place]) 12/26/2019   Gastroesophageal reflux disease without esophagitis 03/17/2019   Rash and nonspecific skin eruption 03/17/2019   Bilateral leg edema 02/16/2019   Varicose veins of both legs with edema 02/16/2019   Heart murmur 09/05/2018   Restrictive lung disease 02/22/2018   Dysgeusia 06/11/2017   Osteoarthritis of left knee 06/03/2017   NAFLD (nonalcoholic fatty liver disease) 08/07/2016   Osteoporosis 12/31/2015   Obesity 12/19/2015  Edema 10/18/2015   Acquired hypothyroidism 10/18/2015   Prolonged QT interval 10/03/2013   Long term (current) use of anticoagulants 06/09/2012   BENIGN NEOPLASM OF ADRENAL GLAND 11/25/2010   Chronic diastolic heart failure ([place]) 02/20/2010   DM 05/16/2009   GOUT 05/16/2009   OBESITY, MORBID 05/16/2009   Hypertension associated with diabetes ([place]) 05/16/2009   Paroxysmal atrial fibrillation ([place]) 05/16/2009   HYPERLIPIDEMIA 11/30/2008   Obstructive sleep apnea on CPAP 11/30/2008    Past Medical History:  Diagnosis Date   Arthritis    Back pain    Chronic anticoagulation    due to aflutter   Chronic kidney disease    Diabetes mellitus    Diastolic CHF, chronic ([place])    a.  echo 2006 - ef 55-65%; mild diast dysfxn;    b. Echo 08/2011: Mild LVH, EF 60%;  c. 04/2013 Echo: EF 65-69%, mild conc LVH;  08/2014 Echo: EF 60-65%, mild-mod MR.   Gout     Hyperlipidemia    Hypertension    a.  Renal arterial Dopplers 12/2011: 1-59% right renal artery stenosis   Morbid obesity ([place])    Obstructive sleep apnea on CPAP    Paroxysmal Afib/Flutter    a. 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    Past Surgical History:  Procedure Laterality Date   APPENDECTOMY     ATRIAL FLUTTER ABLATION N/A 09/24/2011   Procedure: ATRIAL FLUTTER ABLATION;  Surgeon: [name], MD;  Location: [facility] CATH LAB;  Service: Cardiovascular;  Laterality: N/A;   CARDIOVERSION  10/22/2011   Procedure: CARDIOVERSION;  Surgeon: [name], MD;  Location: [facility];  Service: Cardiovascular;  Laterality: N/A;   CARDIOVERSION N/A 09/10/2011   Procedure: CARDIOVERSION;  Surgeon: [name], MD;  Location: [facility] CATH LAB;  Service: Cardiovascular;  Laterality: N/A;   CHOLECYSTECTOMY     COLONOSCOPY WITH PROPOFOL N/A 06/13/2021   Procedure: COLONOSCOPY WITH PROPOFOL;  Surgeon: Milus Banister, MD;  Location: [facility] ENDOSCOPY;  Service: Endoscopy;  Laterality: N/A;   POLYPECTOMY  06/13/2021   Procedure: POLYPECTOMY;  Surgeon: Milus Banister, MD;  Location: [facility] ENDOSCOPY;  Service: Endoscopy;;   TONSILLECTOMY  1982   TOTAL ABDOMINAL HYSTERECTOMY      Social History   Socioeconomic History   Marital status: Married    Spouse name: Not on file   Number of children: 3   Years of education: Not on file   Highest education level: Not on file  Occupational History   Occupation: DISABILITY/housewife    Employer: RETIRED  Tobacco Use   Smoking status: Never   Smokeless tobacco: Never  Vaping Use   Vaping Use: Never used  Substance and Sexual Activity   Alcohol use: No   Drug use: No   Sexual activity: Yes  Other Topics Concern   Not on file  Social History Narrative   Not on file   Social Determinants of Health   Financial Resource Strain: Low Risk  (11/08/2021)   Overall Financial Resource Strain (CARDIA)    Difficulty of Paying Living Expenses: Not hard at all  Food  Insecurity: No Food Insecurity (04/08/2022)   Hunger Vital Sign    Worried About Running Out of Food in the Last Year: Never true    Ran Out of Food in the Last Year: Never true  Transportation Needs: No Transportation Needs (04/08/2022)   PRAPARE - Hydrologist (Medical): No    Lack of Transportation (Non-Medical): No  Physical  Activity: Inactive (11/08/2021)   Exercise Vital Sign    Days of Exercise per Week: 0 days    Minutes of Exercise per Session: 0 min  Stress: No Stress Concern Present (11/08/2021)   [place]    Feeling of Stress : Not at all  Social Connections: [place] (11/08/2021)   Social Connection and Isolation Panel [NHANES]    Frequency of Communication with Friends and Family: More than three times a week    Frequency of Social Gatherings with Friends and Family: Never    Attends Religious Services: More than 4 times per year    Active Member of [employer] or Organizations: Yes    Attends Music therapist: More than 4 times per year    Marital Status: Married  Human resources officer Violence: Not At Risk (11/08/2021)   Humiliation, Afraid, Rape, and Kick questionnaire    Fear of Current or Ex-Partner: No    Emotionally Abused: No    Physically Abused: No    Sexually Abused: No    Family History  Problem Relation Age of Onset   Heart disease Father    Hypertension Father    Breast cancer Sister    Cancer Sister        breast   Colon cancer Neg Hx    Esophageal cancer Neg Hx    Pancreatic cancer Neg Hx    Liver disease Neg Hx      Review of Systems  Constitutional: Negative.  Negative for chills and fever.  HENT: Negative.  Negative for congestion and sore throat.   Respiratory: Negative.  Negative for cough and shortness of breath.   Cardiovascular: Negative.  Negative for chest pain and palpitations.  Gastrointestinal:  Negative for abdominal pain,  diarrhea, nausea and vomiting.  Genitourinary: Negative.   Musculoskeletal: Negative.   Skin:  Positive for itching and rash.  Neurological:  Negative for dizziness and headaches.  All other systems reviewed and are negative.  Today's Vitals   05/29/22 1019  BP: 126/74  Pulse: 61  Temp: 97.9 F (36.6 C)  TempSrc: Oral  SpO2: 92%  Weight: 249 lb 4 oz (113.1 kg)  Height: 5' 1" (1.549 m)   Body mass index is 47.1 kg/m.   Physical Exam Vitals reviewed.  Constitutional:      Appearance: Normal appearance. She is obese.  HENT:     Head: Normocephalic.  Eyes:     Extraocular Movements: Extraocular movements intact.     Pupils: Pupils are equal, round, and reactive to light.  Cardiovascular:     Rate and Rhythm: Normal rate.  Pulmonary:     Effort: Pulmonary effort is normal.  Skin:    General: Skin is warm and dry.  Neurological:     Mental Status: She is alert and oriented to person, place, and time.  Psychiatric:        Mood and Affect: Mood normal.        Behavior: Behavior normal.      ASSESSMENT & PLAN: A total of 50 minutes was spent with the patient and counseling/coordination of care regarding preparing for this visit, review of most recent office visit notes, review of multiple chronic medical problems under management, review of all medications, review of most recent blood work results, education on nutrition, prognosis, documentation and need for follow-up.  Problem List Items Addressed This Visit       Cardiovascular and Mediastinum   Hypertension associated with  diabetes ([place])    Well-controlled hypertension. BP Readings from Last 3 Encounters:  05/29/22 126/74  05/13/22 122/76  05/01/22 118/72  Continue losartan 100 mg daily, metoprolol tartrate 25 mg twice a day and Aldactone 25 mg daily.       Relevant Medications   Glucagon (GVOKE HYPOPEN 2-PACK) 0.5 MG/0.1ML SOAJ   Other Relevant Orders   Lipid panel (Completed)   Comprehensive metabolic  panel (Completed)   CBC with Differential/Platelet (Completed)   Paroxysmal atrial fibrillation (HCC)    Well-controlled.  Sinus rhythm today.        Endocrine   Acquired hypothyroidism    Stable. Lab Results  Component Value Date   TSH 0.84 05/29/2022  Continue Synthroid 100 mcg daily.       Relevant Orders   TSH (Completed)     Other   OBESITY, MORBID    Diet and nutrition discussed.  Advised to decrease amount of daily carbohydrate intake and daily calories and increase amount of plant based protein in her diet. Referral to medical weight management placed today.      Relevant Medications   Glucagon (GVOKE HYPOPEN 2-PACK) 0.5 MG/0.1ML SOAJ   Other Relevant Orders   Amb Ref to Medical Weight Management   Pruritus - Primary    Presently active and affecting quality of life. May take Atarax 25 mg as needed for itching Dermatology referral placed today.      Relevant Medications   hydrOXYzine (ATARAX) 25 MG tablet   Other Relevant Orders   Ambulatory referral to Dermatology   Patient Instructions  Health Maintenance After Age 6 After age 58, you are at a higher risk for certain long-term diseases and infections as well as injuries from falls. Falls are a major cause of broken bones and head injuries in people who are older than age 51. Getting regular preventive care can help to keep you healthy and well. Preventive care includes getting regular testing and making lifestyle changes as recommended by your health care provider. Talk with your health care provider about: Which screenings and tests you should have. A screening is a test that checks for a disease when you have no symptoms. A diet and exercise plan that is right for you. What should I know about screenings and tests to prevent falls? Screening and testing are the best ways to find a health problem early. Early diagnosis and treatment give you the best chance of managing medical conditions that are common  after age 20. Certain conditions and lifestyle choices may make you more likely to have a fall. Your health care provider may recommend: Regular vision checks. Poor vision and conditions such as cataracts can make you more likely to have a fall. If you wear glasses, make sure to get your prescription updated if your vision changes. Medicine review. Work with your health care provider to regularly review all of the medicines you are taking, including over-the-counter medicines. Ask your health care provider about any side effects that may make you more likely to have a fall. Tell your health care provider if any medicines that you take make you feel dizzy or sleepy. Strength and balance checks. Your health care provider may recommend certain tests to check your strength and balance while standing, walking, or changing positions. Foot health exam. Foot pain and numbness, as well as not wearing proper footwear, can make you more likely to have a fall. Screenings, including: Osteoporosis screening. Osteoporosis is a condition that causes the bones to get  weaker and break more easily. Blood pressure screening. Blood pressure changes and medicines to control blood pressure can make you feel dizzy. Depression screening. You may be more likely to have a fall if you have a fear of falling, feel depressed, or feel unable to do activities that you used to do. Alcohol use screening. Using too much alcohol can affect your balance and may make you more likely to have a fall. Follow these instructions at home: Lifestyle Do not drink alcohol if: Your health care provider tells you not to drink. If you drink alcohol: Limit how much you have to: 0-1 drink a day for women. 0-2 drinks a day for men. Know how much alcohol is in your drink. In the [place] one drink equals one 12 oz bottle of beer (355 mL), one 5 oz glass of wine (148 mL), or one 1 oz glass of hard liquor (44 mL). Do not use any products that contain  nicotine or tobacco. These products include cigarettes, chewing tobacco, and vaping devices, such as e-cigarettes. If you need help quitting, ask your health care provider. Activity  Follow a regular exercise program to stay fit. This will help you maintain your balance. Ask your health care provider what types of exercise are appropriate for you. If you need a cane or walker, use it as recommended by your health care provider. Wear supportive shoes that have nonskid soles. Safety  Remove any tripping hazards, such as rugs, cords, and clutter. Install safety equipment such as grab bars in bathrooms and safety rails on stairs. Keep rooms and walkways well-lit. General instructions Talk with your health care provider about your risks for falling. Tell your health care provider if: You fall. Be sure to tell your health care provider about all falls, even ones that seem minor. You feel dizzy, tiredness (fatigue), or off-balance. Take over-the-counter and prescription medicines only as told by your health care provider. These include supplements. Eat a healthy diet and maintain a healthy weight. A healthy diet includes low-fat dairy products, low-fat (lean) meats, and fiber from whole grains, beans, and lots of fruits and vegetables. Stay current with your vaccines. Schedule regular health, dental, and eye exams. Summary Having a healthy lifestyle and getting preventive care can help to protect your health and wellness after age 36. Screening and testing are the best way to find a health problem early and help you avoid having a fall. Early diagnosis and treatment give you the best chance for managing medical conditions that are more common for people who are older than age 65. Falls are a major cause of broken bones and head injuries in people who are older than age 78. Take precautions to prevent a fall at home. Work with your health care provider to learn what changes you can make to improve  your health and wellness and to prevent falls. This information is not intended to replace advice given to you by your health care provider. Make sure you discuss any questions you have with your health care provider. Document Revised: 02/25/2021 Document Reviewed: 02/25/2021 Elsevier Patient Education  [place], MD [facility]

## 2022-05-29 NOTE — Patient Instructions (Signed)
Health Maintenance After Age 79 After age 79, you are at a higher risk for certain long-term diseases and infections as well as injuries from falls. Falls are a major cause of broken bones and head injuries in people who are older than age 79. Getting regular preventive care can help to keep you healthy and well. Preventive care includes getting regular testing and making lifestyle changes as recommended by your health care provider. Talk with your health care provider about: Which screenings and tests you should have. A screening is a test that checks for a disease when you have no symptoms. A diet and exercise plan that is right for you. What should I know about screenings and tests to prevent falls? Screening and testing are the best ways to find a health problem early. Early diagnosis and treatment give you the best chance of managing medical conditions that are common after age 79. Certain conditions and lifestyle choices may make you more likely to have a fall. Your health care provider may recommend: Regular vision checks. Poor vision and conditions such as cataracts can make you more likely to have a fall. If you wear glasses, make sure to get your prescription updated if your vision changes. Medicine review. Work with your health care provider to regularly review all of the medicines you are taking, including over-the-counter medicines. Ask your health care provider about any side effects that may make you more likely to have a fall. Tell your health care provider if any medicines that you take make you feel dizzy or sleepy. Strength and balance checks. Your health care provider may recommend certain tests to check your strength and balance while standing, walking, or changing positions. Foot health exam. Foot pain and numbness, as well as not wearing proper footwear, can make you more likely to have a fall. Screenings, including: Osteoporosis screening. Osteoporosis is a condition that causes  the bones to get weaker and break more easily. Blood pressure screening. Blood pressure changes and medicines to control blood pressure can make you feel dizzy. Depression screening. You may be more likely to have a fall if you have a fear of falling, feel depressed, or feel unable to do activities that you used to do. Alcohol use screening. Using too much alcohol can affect your balance and may make you more likely to have a fall. Follow these instructions at home: Lifestyle Do not drink alcohol if: Your health care provider tells you not to drink. If you drink alcohol: Limit how much you have to: 0-1 drink a day for women. 0-2 drinks a day for men. Know how much alcohol is in your drink. In the [place] one drink equals one 12 oz bottle of beer (355 mL), one 5 oz glass of wine (148 mL), or one 1 oz glass of hard liquor (44 mL). Do not use any products that contain nicotine or tobacco. These products include cigarettes, chewing tobacco, and vaping devices, such as e-cigarettes. If you need help quitting, ask your health care provider. Activity  Follow a regular exercise program to stay fit. This will help you maintain your balance. Ask your health care provider what types of exercise are appropriate for you. If you need a cane or walker, use it as recommended by your health care provider. Wear supportive shoes that have nonskid soles. Safety  Remove any tripping hazards, such as rugs, cords, and clutter. Install safety equipment such as grab bars in bathrooms and safety rails on stairs. Keep rooms and walkways   well-lit. General instructions Talk with your health care provider about your risks for falling. Tell your health care provider if: You fall. Be sure to tell your health care provider about all falls, even ones that seem minor. You feel dizzy, tiredness (fatigue), or off-balance. Take over-the-counter and prescription medicines only as told by your health care provider. These include  supplements. Eat a healthy diet and maintain a healthy weight. A healthy diet includes low-fat dairy products, low-fat (lean) meats, and fiber from whole grains, beans, and lots of fruits and vegetables. Stay current with your vaccines. Schedule regular health, dental, and eye exams. Summary Having a healthy lifestyle and getting preventive care can help to protect your health and wellness after age 79. Screening and testing are the best way to find a health problem early and help you avoid having a fall. Early diagnosis and treatment give you the best chance for managing medical conditions that are more common for people who are older than age 79. Falls are a major cause of broken bones and head injuries in people who are older than age 79. Take precautions to prevent a fall at home. Work with your health care provider to learn what changes you can make to improve your health and wellness and to prevent falls. This information is not intended to replace advice given to you by your health care provider. Make sure you discuss any questions you have with your health care provider. Document Revised: 02/25/2021 Document Reviewed: 02/25/2021 Elsevier Patient Education  2023 Elsevier Inc.  

## 2022-05-29 NOTE — Assessment & Plan Note (Signed)
Diet and nutrition discussed.  Advised to decrease amount of daily carbohydrate intake and daily calories and increase amount of plant based protein in her diet. Referral to medical weight management placed today.

## 2022-05-29 NOTE — Assessment & Plan Note (Addendum)
Stable. Lab Results  Component Value Date   TSH 0.84 05/29/2022  Continue Synthroid 100 mcg daily.  

## 2022-05-30 ENCOUNTER — Other Ambulatory Visit: Payer: Self-pay | Admitting: Emergency Medicine

## 2022-05-30 ENCOUNTER — Telehealth: Payer: Self-pay | Admitting: *Deleted

## 2022-05-30 ENCOUNTER — Telehealth: Payer: Self-pay | Admitting: Emergency Medicine

## 2022-05-30 DIAGNOSIS — E878 Other disorders of electrolyte and fluid balance, not elsewhere classified: Secondary | ICD-10-CM

## 2022-05-30 DIAGNOSIS — E1159 Type 2 diabetes mellitus with other circulatory complications: Secondary | ICD-10-CM

## 2022-05-30 MED ORDER — GVOKE HYPOPEN 2-PACK 0.5 MG/0.1ML ~~LOC~~ SOAJ: 0.5000 mg | Freq: Every day | SUBCUTANEOUS | 5 refills | Status: AC | PRN

## 2022-05-30 NOTE — Telephone Encounter (Signed)
As needed for hypoglycemic events.

## 2022-05-30 NOTE — Telephone Encounter (Signed)
Pharmacy needs clarification on the frequency of patient medication Glucagon ( Gvoke Hypopen) Please advise

## 2022-05-30 NOTE — Telephone Encounter (Signed)
Blood results discussed with patient. Sodium and chloride lower than normal Increased bicarb and increased BUN Decreased GFR. Suspected intravascular volume depletion. Likely secondary to diuretic use. Advised to decrease torsemide to every other day Continue Aldactone 25 mg daily. Normal potassium. Patient not taking losartan for blood pressure for the past 2 months. Not taking potassium supplementation. Medication list reviewed with patient. Continues beta-blocker, Aldactone, clonidine 0.3 twice a day and metoprolol succinate for blood pressure control Eliquis for A-fib Continues Multaq 400 mg times a day. Allopurinol for gout prevention Echocardiogram from 1 year ago, report reviewed with patient.  Normal ejection fraction. Insulin-dependent diabetic on insulin pump and weekly Ozempic.  Sees endocrinologist on a regular basis. TSH down to normal after Synthroid dose was increased to 100 mcg daily. BMP in 4 weeks.

## 2022-06-02 ENCOUNTER — Ambulatory Visit: Payer: Medicare Other | Admitting: *Deleted

## 2022-06-02 DIAGNOSIS — E1142 Type 2 diabetes mellitus with diabetic polyneuropathy: Secondary | ICD-10-CM

## 2022-06-02 DIAGNOSIS — I5032 Chronic diastolic (congestive) heart failure: Secondary | ICD-10-CM

## 2022-06-02 NOTE — Chronic Care Management (AMB) (Signed)
Care Management    RN Visit Note  06/02/2022 Name: [name] MRN: [id number] DOB: 14-Jun-1941  Subjective: [name] is a 81 y.o. year old female who is a primary care patient of Sagardia, Ines Bloomer, MD. The care management team was consulted for assistance with disease management and care coordination needs.    Engaged with patient by telephone for follow up visit/ RN CM case closure in response to provider referral for case management and/or care coordination services.   Consent to Services:   [name] was given information about Care Management services 03/11/21 including:  Care Management services includes personalized support from designated clinical staff supervised by her physician, including individualized plan of care and coordination with other care providers 24/7 contact phone numbers for assistance for urgent and routine care needs. The patient may stop case management services at any time by phone call to the office staff.  Patient agreed to services and consent obtained.   Assessment: Review of patient past medical history, allergies, medications, health status, including review of consultants reports, laboratory and other test data, was performed as part of comprehensive evaluation and provision of chronic care management services.   SDOH (Social Determinants of Health) assessments and interventions performed:  SDOH Interventions    Flowsheet Row Most Recent Value  SDOH Interventions   Food Insecurity Interventions Intervention Not Indicated  Transportation Interventions Intervention Not Indicated  [patient reports drives self "sometimes, " also has transportation assistance as needed from spouse/ friends]     Care Plan  Allergies  Allergen Reactions   Doxycycline     Made patient feel very ill    Metformin Diarrhea   Outpatient Encounter Medications as of 06/02/2022  Medication Sig Note   Accu-Chek Softclix Lancets lancets 1 each by Other route  3 (three) times daily. as directed    allopurinol (ZYLOPRIM) 300 MG tablet TAKE 1 TABLET(300 MG) BY MOUTH DAILY    apixaban (ELIQUIS) 5 MG TABS tablet Take 1 tablet (5 mg total) by mouth 2 (two) times daily.    azelastine (ASTELIN) 0.1 % nasal spray 1-2 puffs each nostril at bedtime as needed    b complex vitamins capsule Take 1 capsule by mouth daily.    blood glucose meter kit and supplies Dispense based on patient and insurance preference. Use up to four times daily as directed. (FOR ICD-10 E10.9, E11.9).    BREZTRI AEROSPHERE 160-9-4.8 MCG/ACT AERO USE 2 INHALATIONS BY MOUTH IN  THE MORNING AND AT BEDTIME    cholecalciferol (VITAMIN D3) 25 MCG (1000 UNIT) tablet Take 1,000 Units by mouth daily.    cloNIDine (CATAPRES) 0.3 MG tablet TAKE 1 TABLET BY MOUTH  TWICE DAILY    Continuous Blood Gluc Receiver (FREESTYLE LIBRE 14 DAY READER) DEVI Use as directed.    Continuous Blood Gluc Sensor (FREESTYLE LIBRE 2 SENSOR) MISC USE AS DIRECTED TO CHECK BLOOD  SUGAR DAILY    dronedarone (MULTAQ) 400 MG tablet Take 1 tablet (400 mg total) by mouth 2 (two) times daily.    fluocinonide-emollient (LIDEX-E) 0.05 % cream Apply 1 Application topically 2 (two) times daily.    Glucagon (GVOKE HYPOPEN 2-PACK) 0.5 MG/0.1ML SOAJ Inject 0.5 mg into the skin daily as needed. For Hypoglycemic events    hydrOXYzine (ATARAX) 25 MG tablet Take 1 tablet (25 mg total) by mouth every 8 (eight) hours as needed.    Insulin Lispro Prot & Lispro (HUMALOG MIX 75/25 KWIKPEN) (75-25) 100 UNIT/ML Kwikpen Inject 25 Units into the skin  2 (two) times daily. (Patient taking differently: Inject 15-20 Units into the skin 2 (two) times daily.)    ipratropium (ATROVENT HFA) 17 MCG/ACT inhaler Inhale 2 puffs into the lungs every 6 (six) hours as needed for wheezing.    ketorolac (ACULAR) 0.5 % ophthalmic solution SMARTSIG:In Eye(s)    levothyroxine (SYNTHROID) 100 MCG tablet Take 1 tablet (100 mcg total) by mouth daily.    lidocaine (LIDODERM)  5 % Place 1 patch onto the skin daily. Remove & Discard patch within 12 hours or as directed by MD    metoprolol tartrate (LOPRESSOR) 25 MG tablet Take 1 tablet (25 mg total) by mouth 2 (two) times daily.    NOVOFINE PLUS PEN NEEDLE 32G X 4 MM MISC USE TWICE DAILY AS DIRECTED    ONETOUCH VERIO test strip 1 each by Other route 3 (three) times daily.    RESTASIS MULTIDOSE 0.05 % ophthalmic emulsion Place 1 drop into both eyes 2 (two) times daily.     rosuvastatin (CRESTOR) 10 MG tablet TAKE 1 TABLET BY MOUTH IN  THE EVENING    Semaglutide, 2 MG/DOSE, (OZEMPIC, 2 MG/DOSE,) 8 MG/3ML SOPN Inject 2 mg into the skin once a week.    spironolactone (ALDACTONE) 25 MG tablet TAKE 1 TABLET BY MOUTH DAILY    TOBRADEX ophthalmic ointment SMARTSIG:Sparingly In Eye(s) Every Night    torsemide (DEMADEX) 20 MG tablet Take 1 tablet (20 mg total) by mouth daily. 06/02/2022: 06/02/22: reports taking every other day, per PCP instructions 05/29/22   triamcinolone cream (KENALOG) 0.1 % Apply 1 application topically 2 (two) times daily.    vitamin B-12 (CYANOCOBALAMIN) 1000 MCG tablet Take 1,000 mcg by mouth daily.    [DISCONTINUED] potassium chloride (KLOR-CON) 10 MEQ tablet Take 4 tablets (40 mEq total) by mouth daily.    No facility-administered encounter medications on file as of 06/02/2022.   Patient Active Problem List   Diagnosis Date Noted   Primary hyperaldosteronism ([place]) 03/06/2022   Coronary artery disease involving native coronary artery of native heart without angina pectoris 02/12/2022   Adrenal adenoma, right 02/07/2022   Skin lesion of right arm 02/02/2022   Asthma 11/01/2021   Normocytic anemia 10/11/2021   Nodule of right lung 09/30/2021   Dysthymia 03/12/2021   Primary osteoarthritis involving multiple joints 03/01/2021   Recurrent falls 01/10/2021   Sensorineural hearing loss (SNHL), 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   Persistent atrial fibrillation ([place]) 08/12/2020   Type 2 diabetes mellitus with  hyperglycemia, with long-term current use of insulin ([place]) 06/21/2020   Type 2 diabetes mellitus with diabetic polyneuropathy, with long-term current use of insulin ([place]) 06/21/2020   Mixed hyperlipidemia 06/21/2020   Uncontrolled hypertension 06/11/2020   Aortic atherosclerosis ([place]) 03/28/2020   Diabetic peripheral neuropathy ([place]) 12/26/2019   Gastroesophageal reflux disease without esophagitis 03/17/2019   Rash and nonspecific skin eruption 03/17/2019   Bilateral leg edema 02/16/2019   Pruritus 02/16/2019   Varicose veins of both legs with edema 02/16/2019   Heart murmur 09/05/2018   Restrictive lung disease 02/22/2018   Dysgeusia 06/11/2017   Osteoarthritis of left knee 06/03/2017   NAFLD (nonalcoholic fatty liver disease) 08/07/2016   Osteoporosis 12/31/2015   Obesity 12/19/2015   Edema 10/18/2015   Acquired hypothyroidism 10/18/2015   Prolonged QT interval 10/03/2013   Long term (current) use of anticoagulants 06/09/2012   BENIGN NEOPLASM OF ADRENAL GLAND 11/25/2010   Chronic diastolic heart failure ([place]) 02/20/2010   DM 05/16/2009   GOUT 05/16/2009   OBESITY, MORBID 05/16/2009  Hypertension associated with diabetes ([place]) 05/16/2009   Paroxysmal atrial fibrillation ([place]) 05/16/2009   HYPERLIPIDEMIA 11/30/2008   Obstructive sleep apnea on CPAP 11/30/2008   Conditions to be addressed/monitored: CHF and DMII  Care Plan : RN Care Manager Plan of Care  Updates made by Knox Royalty, RN since 06/02/2022 12:00 AM     Problem: Chronic Disease Management Needs   Priority: High     Long-Range Goal: Ongoing adherence to established care plan for long term chronic disease management   Start Date: 10/09/2021  Expected End Date: 10/09/2022  Priority: High  Note:   Current Barriers:  Chronic Disease Management support and education needs related to Atrial Fibrillation, CHF, and DMII Fragile state of health, multiple progressing chronic health conditions Hospitalization  October 11, 2021- October 20, 2021: A-Fib with RVR; reports was at [facility]- patient reports received cardioversion during hospitalization Hospitalization April 29- Feb 18, 2022 at "[place];" [facility]) for acute respiratory failure/ CHF/ pneumonia- patient discharged home to self care; reports she spoke with hospital doctor as I advised on 02/17/22 about obtaining home health services for bath aide during her hospitalization, but "nothing came of it" 02/27/22: Patient is requesting PCP order today for home health services for a bath aide-- she tells me she forgot to ask Dr. Mitchel Honour about this when she had recent office visit on 02/24/22-- will make PCP aware of same 04/08/22- confirmed with patient she did not hear back from PCP about her request for home health services- she declines need at this point; states she has appointment on Friday 04/11/22 with [facility] for personal care services initiation through her medicaid benefit 02/27/22: Patient tells me today that she wants to have personal care services through her medicaid benefit- states "my sister in [place] has the same medicaid as I do, and she has someone come in 3 times a week to help her with her bath and daily care:" explained to patient that medicaid benefits are different from state to state, as well as from plan-plan, and I encouraged her to contact her medicaid insurance provider to see if personal care services are included in her medicaid benefits: she states she will do; however, she tells me she will need assistance as she "doesn't always understand" what her insurance benefits for both medicare and medicaid "mean;"  will place CCM CSW referral to help patient navigate this possible option of her medicaid benefit 04/08/22: confirmed that patient spoke with CCM CSW after my last outreach when referral was placed; states she has appointment on Friday 04/11/22 with [facility] for personal care services initiation through her medicaid  benefit 06/02/22: confirmed with patient today that personal care services have successfully been implemented- reports home visits M-W-F for 3 hours each visit: reports "going great; loves services;" CSW updated accordingly  RNCM Clinical Goal(s):  Patient will demonstrate ongoing health management independence as evidenced by adherence to established plan of care for CHF/ AF, and DMII        through collaboration with RN Care manager, provider, and care team.   Interventions: 1:1 collaboration with primary care provider regarding development and update of comprehensive plan of care as evidenced by provider attestation and co-signature Inter-disciplinary care team collaboration (see longitudinal plan of care) Evaluation of current treatment plan related to  self management and patient's adherence to plan as established by provider Review of patient status, including review of consultants reports, relevant laboratory and other test results, and medications completed SDOH updated: no new/ unmet  concerns identified Pain assessment updated: denies pain today Falls assessment updated: continues to deny new/ recent falls x 12 months- continues using walker "all the time;"  positive reinforcement provided with encouragement to continue efforts at fall prevention; previously provided education around fall risks/ prevention reinforced Reviewed recent PCP office visit 05/29/22: confirms "the itching medicine he gave me is not working;" states she has not heard back from dermatology referral yet; confirms she has modified her diuretic (torsemide) as directed: now taking every other day, at least temporarily; tells me that she plans to contact cardiology to make them aware of her adjustment in dosing of diuretic Medications discussed: reports continues to independently self-manage and denies current concerns/ issues/ questions around medications; endorses adherence to taking all medications as prescribed and is  able to verbalize a good understanding of the purpose, dosing, and scheduling of her medications Reviewed upcoming scheduled provider appointments: 06/16/22- pulmonary provider; 07/30/22- podiatry provider; 08/13/22- endocrinology provider; 09/08/22- cardiology provider; patient confirms is aware of all and has plans to attend as scheduled Discussed plans with patient for ongoing care management follow up- patient denies current care coordination/ care management needs and is agreeable to RN CM case closure today; verbalizes understanding to contact PCP or other care providers for any needs that arise in the future, and confirms she has contact information for all care providers     Heart Failure Interventions:  (Status: 06/02/22: Goal Met.)  Long Term Goal  Discussed importance of daily weight and advised patient to weigh and record daily Discussed the importance of keeping all appointments with provider Confirmed  patient continues monitoring/ recording daily weights at home: she reports consistent daily weights today at "248-252 lbs" which is consistent with previously reported weight ranges at home Confirmed patient has no current clinical concerns around swelling/ breathing issues: she tells me she is "feeling good and breathing just fine" today Reinforced previously provided education around importance of daily weights at home as earliest indicator of fluid retention; sign/ symptoms yellow CHF zone, along with corresponding action plan; importance of daily assessment stressed-- encouraged her to contact her cardiologist to report recent adjustment of torsemide dosing (QD to every other day) as an [place] Confirmed patient continues monitoring and recording blood pressures at home weekly: she does not have specific values to review today, but tells me that "they have all been very good with no high or low readings" Confirmed patient continues trying to follow low sodium/ low cholesterol/ heart healthy  diet Confirmed patient continues using CPAP as prescribed; reports using maintenance inhaler as prescribed; has not needed to use rescue inhaler recently  Diabetes:  (Status: 06/02/22- Goal Met.) Long Term Goal   Lab Results  Component Value Date   HGBA1C 8.2 (A) 05/13/2022  Provided education to patient about basic DM disease process; Counseled on importance of regular laboratory monitoring as prescribed;        Assessed social determinant of health barriers;        Confirmed continues "trying" to follow carb-modified, low sugar diet; reinforced previously provided education around value of portion control; appropriate dietary choices in setting of of DMII Confirmed patient continues using her CGM, reviewed recent blood sugars at home and she again reports "they are all (fasting and post-prandial) are always between 130 and 150;" she reports a "few" isolated post-prandial values between "160-180;"  Confirmed she continues taking long acting insulin as advised: it is difficult to confirm, but from what patient tells me she continues NOT taking short  acting insulin Confirmed patient has continued taking Ozempic q week as prescribed Confirmed with patient no recent low blood sugar readings; she is able to verbalize accurate signs/ symptoms of same, as well as action plan for same; she will benefit from ongoing reinforcement, education, and support- confirmed she obtained recently prescribed glucagon (PCP office visit 05/29/22)- has not had to use recently     Plan:  No further follow up required: patient denies current care coordination/ care management needs and is agreeable to RN CM case closure today; case closure accordingly     [name], RN, BSN, [place] [phone]: direct office

## 2022-06-03 ENCOUNTER — Telehealth: Payer: Self-pay | Admitting: Internal Medicine

## 2022-06-03 NOTE — Telephone Encounter (Signed)
Pt c/o medication issue:  1. Name of Medication: dronedarone (MULTAQ) 400 MG tablet  2. How are you currently taking this medication (dosage and times per day)?   3. Are you having a reaction (difficulty breathing--STAT)?   4. What is your medication issue? Pt states she has a rash that itches terribly bad on her hand. She went to PCP for cream and it did not help. She states she is just now reading the side effects of this medication and realized that this itch was one of them. She states she spoke to the pharmacist about it and they recommended she make an appt. Informed her first appt is with Chalmers Cater, [facility] on 06/17/22, pt did not want to wait that long.

## 2022-06-03 NOTE — Telephone Encounter (Signed)
Spoke with pt who complains of pruritic rash of right arm and hand x 1 month.  Pt reports she has had several appointments and treatments with her PCP for rash with no improvement.  Pt has now been referred to dermatology.   Pt states she spoke with her pharmacist yesterday who recommended she contact cardiology suggesting that the rash may be coming from her Multaq.  Scheduling has made the pt an appointment on 06/17/22 with A. Tillery, PA-C. Will forward information to our pharmacy team for review and recommendation as well.  Reviewed  ED precautions.  Pt verbalizes understanding and agrees with current plan.

## 2022-06-03 NOTE — Telephone Encounter (Signed)
Agree with Jinny Blossom that it would be unusual to be localized from a drug, and would recommend dermatology eval   If we need to stop< after derm visit, the multaq, that is not a problem

## 2022-06-03 NOTE — Telephone Encounter (Signed)
She should keep appt with dermatology, not overly common for drug rashes to be localized to 1 small area. Multaq can cause rash as a side effect, it looks like she's been taking this since at least January of this year though. Need to defer to provider for any potential change in anti-arrhythmic therapy. Would send to EP team before her appt on 8/29 for any recommendations.

## 2022-06-05 ENCOUNTER — Encounter: Payer: Self-pay | Admitting: Emergency Medicine

## 2022-06-05 ENCOUNTER — Ambulatory Visit (INDEPENDENT_AMBULATORY_CARE_PROVIDER_SITE_OTHER): Payer: Medicare Other | Admitting: Emergency Medicine

## 2022-06-05 VITALS — BP 120/74 | HR 62 | Temp 97.5°F | Ht 61.0 in | Wt 255.4 lb

## 2022-06-05 DIAGNOSIS — R21 Rash and other nonspecific skin eruption: Secondary | ICD-10-CM

## 2022-06-05 MED ORDER — CLOBETASOL PROPIONATE 0.05 % EX CREA: 1.0000 | TOPICAL_CREAM | Freq: Two times a day (BID) | CUTANEOUS | 1 refills | Status: DC

## 2022-06-05 NOTE — Progress Notes (Signed)
[name] 81 y.o.   Chief Complaint  Patient presents with   Rash    On the right forearm     HISTORY OF PRESENT ILLNESS: Acute problem visit today. This is a 81 y.o. female complaining of itchy rash to right forearm. Has questions about Multaq and possible drug allergy. Has been on Multaq for atrial fibrillation for several months.  Did not have a reaction initially.  Rash localized to right forearm. Slowly getting better.  Still itching.  However looks better than last week when I saw her.  HPI   Prior to Admission medications   Medication Sig Start Date End Date Taking? Authorizing Provider  Accu-Chek Softclix Lancets lancets 1 each by Other route 3 (three) times daily. as directed 03/30/20  Yes Maximiano Coss, NP  allopurinol (ZYLOPRIM) 300 MG tablet TAKE 1 TABLET(300 MG) BY MOUTH DAILY 10/18/21  Yes Bular Hickok, Ines Bloomer, MD  apixaban (ELIQUIS) 5 MG TABS tablet Take 1 tablet (5 mg total) by mouth 2 (two) times daily. 03/31/22  Yes [name], MD  azelastine (ASTELIN) 0.1 % nasal spray 1-2 puffs each nostril at bedtime as needed 01/02/22  Yes Young, Tarri Fuller D, MD  b complex vitamins capsule Take 1 capsule by mouth daily.   Yes [provider]  blood glucose meter kit and supplies Dispense based on patient and insurance preference. Use up to four times daily as directed. (FOR ICD-10 E10.9, E11.9). 08/29/21  Yes Lizzie Cokley, Ines Bloomer, MD  BREZTRI AEROSPHERE 160-9-4.8 MCG/ACT AERO USE 2 INHALATIONS BY MOUTH IN  THE MORNING AND AT BEDTIME 11/12/21  Yes Cobb, Karie Schwalbe, NP  cholecalciferol (VITAMIN D3) 25 MCG (1000 UNIT) tablet Take 1,000 Units by mouth daily.   Yes [provider]  cloNIDine (CATAPRES) 0.3 MG tablet TAKE 1 TABLET BY MOUTH  TWICE DAILY 01/28/22  Yes Jacquette Canales, Ines Bloomer, MD  Continuous Blood Gluc Receiver (FREESTYLE LIBRE 14 DAY READER) DEVI Use as directed. 08/29/21  Yes Terique Kawabata, Ines Bloomer, MD  Continuous Blood Gluc Sensor (FREESTYLE  LIBRE 2 SENSOR) MISC USE AS DIRECTED TO CHECK BLOOD  SUGAR DAILY 04/06/22  Yes Horald Pollen, MD  dronedarone (MULTAQ) 400 MG tablet Take 1 tablet (400 mg total) by mouth 2 (two) times daily. 12/19/21  Yes [name], MD  fluocinonide-emollient (LIDEX-E) 0.05 % cream Apply 1 Application topically 2 (two) times daily. 05/01/22  Yes Henson, Vickie L, NP-C  Glucagon (GVOKE HYPOPEN 2-PACK) 0.5 MG/0.1ML SOAJ Inject 0.5 mg into the skin daily as needed. For Hypoglycemic events 05/30/22  Yes Nyomie Ehrlich, Ines Bloomer, MD  hydrOXYzine (ATARAX) 25 MG tablet Take 1 tablet (25 mg total) by mouth every 8 (eight) hours as needed. 05/29/22  Yes Quinne Pires, Ines Bloomer, MD  Insulin Lispro Prot & Lispro (HUMALOG MIX 75/25 KWIKPEN) (75-25) 100 UNIT/ML Kwikpen Inject 25 Units into the skin 2 (two) times daily. Patient taking differently: Inject 15-20 Units into the skin 2 (two) times daily. 10/04/21  Yes [name], Melanie Crazier, MD  ipratropium (ATROVENT HFA) 17 MCG/ACT inhaler Inhale 2 puffs into the lungs every 6 (six) hours as needed for wheezing. 12/17/21 12/17/22 Yes Baird Lyons D, MD  ketorolac (ACULAR) 0.5 % ophthalmic solution SMARTSIG:In Eye(s) 08/22/21  Yes [provider]  levothyroxine (SYNTHROID) 100 MCG tablet Take 1 tablet (100 mcg total) by mouth daily. 03/18/22  Yes Auston Halfmann, Ines Bloomer, MD  lidocaine (LIDODERM) 5 % Place 1 patch onto the skin daily. Remove & Discard patch within 12 hours or as directed  by MD 08/29/20  Yes Mcarthur Rossetti, MD  NOVOFINE PLUS PEN NEEDLE 32G X 4 MM MISC USE TWICE DAILY AS DIRECTED 04/08/21  Yes Kelli Robeck, Ines Bloomer, MD  [facility] VERIO test strip 1 each by Other route 3 (three) times daily. 06/08/20  Yes [provider]  RESTASIS MULTIDOSE 0.05 % ophthalmic emulsion Place 1 drop into both eyes 2 (two) times daily.  03/04/20  Yes [provider]  rosuvastatin (CRESTOR) 10 MG tablet TAKE 1 TABLET BY MOUTH IN  THE EVENING 04/03/22  Yes  Brice Potteiger, [place], MD  Semaglutide, 2 MG/DOSE, (OZEMPIC, 2 MG/DOSE,) 8 MG/3ML SOPN Inject 2 mg into the skin once a week. 02/07/22  Yes [name], Melanie Crazier, MD  spironolactone (ALDACTONE) 25 MG tablet TAKE 1 TABLET BY MOUTH DAILY 04/28/22  Yes [name], Melanie Crazier, MD  TOBRADEX ophthalmic ointment SMARTSIG:Sparingly In Eye(s) Every Night 08/25/21  Yes [provider]  torsemide (DEMADEX) 20 MG tablet Take 1 tablet (20 mg total) by mouth daily. 05/30/21  Yes Sherran Needs, NP  triamcinolone cream (KENALOG) 0.1 % Apply 1 application topically 2 (two) times daily. 07/26/20  Yes Rondale Nies, Ines Bloomer, MD  vitamin B-12 (CYANOCOBALAMIN) 1000 MCG tablet Take 1,000 mcg by mouth daily.   Yes [provider]  metoprolol tartrate (LOPRESSOR) 25 MG tablet Take 1 tablet (25 mg total) by mouth 2 (two) times daily. 12/11/21 03/11/22  Horald Pollen, MD  potassium chloride (KLOR-CON) 10 MEQ tablet Take 4 tablets (40 mEq total) by mouth daily. 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   Coronary artery disease involving native coronary artery of native heart without angina pectoris 02/12/2022   Adrenal adenoma, right 02/07/2022   Skin lesion of right arm 02/02/2022   Asthma 11/01/2021   Normocytic anemia 10/11/2021   Nodule of right lung 09/30/2021   Dysthymia 03/12/2021   Primary osteoarthritis involving multiple joints 03/01/2021   Recurrent falls 01/10/2021   Sensorineural hearing loss (SNHL), 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   Persistent atrial fibrillation ([place]) 08/12/2020   Type 2 diabetes mellitus with hyperglycemia, with long-term current use of insulin ([place]) 06/21/2020   Type 2 diabetes mellitus with diabetic polyneuropathy, with long-term current use of insulin  ([place]) 06/21/2020   Mixed hyperlipidemia 06/21/2020   Uncontrolled hypertension 06/11/2020   Aortic atherosclerosis ([place]) 03/28/2020   Diabetic peripheral neuropathy ([place]) 12/26/2019   Gastroesophageal reflux disease without esophagitis 03/17/2019   Rash and nonspecific skin eruption 03/17/2019   Bilateral leg edema 02/16/2019   Pruritus 02/16/2019   Varicose veins of both legs with edema 02/16/2019   Heart murmur 09/05/2018   Restrictive lung disease 02/22/2018   Dysgeusia 06/11/2017   Osteoarthritis of left knee 06/03/2017   NAFLD (nonalcoholic fatty liver disease) 08/07/2016   Osteoporosis 12/31/2015   Obesity 12/19/2015   Edema 10/18/2015   Acquired hypothyroidism 10/18/2015   Prolonged QT interval 10/03/2013   Long term (current) use of anticoagulants 06/09/2012   BENIGN NEOPLASM OF ADRENAL GLAND 11/25/2010   Chronic diastolic heart failure ([place]) 02/20/2010   DM 05/16/2009   GOUT 05/16/2009   OBESITY, MORBID 05/16/2009   Hypertension associated with diabetes ([place]) 05/16/2009   Paroxysmal atrial fibrillation ([place]) 05/16/2009   HYPERLIPIDEMIA 11/30/2008   Obstructive sleep apnea on CPAP 11/30/2008  Past Medical History:  Diagnosis Date   Arthritis    Back pain    Chronic anticoagulation    due to aflutter   Chronic kidney disease    Diabetes mellitus    Diastolic CHF, chronic ([place])    a.  echo 2006 - ef 55-65%; mild diast dysfxn;    b. Echo 08/2011: Mild LVH, EF 60%;  c. 04/2013 Echo: EF 65-69%, mild conc LVH;  08/2014 Echo: EF 60-65%, mild-mod MR.   Gout    Hyperlipidemia    Hypertension    a.  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    Past Surgical History:  Procedure Laterality Date   APPENDECTOMY     ATRIAL FLUTTER ABLATION N/A 09/24/2011   Procedure: ATRIAL FLUTTER ABLATION;  Surgeon: [name],  MD;  Location: [facility] CATH LAB;  Service: Cardiovascular;  Laterality: N/A;   CARDIOVERSION  10/22/2011   Procedure: CARDIOVERSION;  Surgeon: [name], MD;  Location: [place];  Service: Cardiovascular;  Laterality: N/A;   CARDIOVERSION N/A 09/10/2011   Procedure: CARDIOVERSION;  Surgeon: [name], MD;  Location: [facility] CATH LAB;  Service: Cardiovascular;  Laterality: N/A;   CHOLECYSTECTOMY     COLONOSCOPY WITH PROPOFOL N/A 06/13/2021   Procedure: COLONOSCOPY WITH PROPOFOL;  Surgeon: Milus Banister, MD;  Location: [facility] ENDOSCOPY;  Service: Endoscopy;  Laterality: N/A;   POLYPECTOMY  06/13/2021   Procedure: POLYPECTOMY;  Surgeon: Milus Banister, MD;  Location: [facility] ENDOSCOPY;  Service: Endoscopy;;   TONSILLECTOMY  1982   TOTAL ABDOMINAL HYSTERECTOMY      Social History   Socioeconomic History   Marital status: Married    Spouse name: Not on file   Number of children: 3   Years of education: Not on file   Highest education level: Not on file  Occupational History   Occupation: DISABILITY/housewife    Employer: RETIRED  Tobacco Use   Smoking status: Never   Smokeless tobacco: Never  Vaping Use   Vaping Use: Never used  Substance and Sexual Activity   Alcohol use: No   Drug use: No   Sexual activity: Yes  Other Topics Concern   Not on file  Social History Narrative   Not on file   Social Determinants of Health   Financial Resource Strain: Low Risk  (11/08/2021)   Overall Financial Resource Strain (CARDIA)    Difficulty of Paying Living Expenses: Not hard at all  Food Insecurity: No Food Insecurity (06/02/2022)   Hunger Vital Sign    Worried About Running Out of Food in the Last Year: Never true    Ran Out of Food in the Last Year: Never true  Transportation Needs: No Transportation Needs (06/02/2022)   PRAPARE - Hydrologist (Medical): No    Lack of Transportation (Non-Medical): No  Physical Activity: Inactive (11/08/2021)   Exercise Vital Sign     Days of Exercise per Week: 0 days    Minutes of Exercise per Session: 0 min  Stress: No Stress Concern Present (11/08/2021)   [place]    Feeling of Stress : Not at all  Social Connections: [place] (11/08/2021)   Social Connection and Isolation Panel [NHANES]    Frequency of Communication with Friends and Family: More  than three times a week    Frequency of Social Gatherings with Friends and Family: Never    Attends Religious Services: More than 4 times per year    Active Member of [employer] or Organizations: Yes    Attends Music therapist: More than 4 times per year    Marital Status: Married  Human resources officer Violence: Not At Risk (11/08/2021)   Humiliation, Afraid, Rape, and Kick questionnaire    Fear of Current or Ex-Partner: No    Emotionally Abused: No    Physically Abused: No    Sexually Abused: No    Family History  Problem Relation Age of Onset   Heart disease Father    Hypertension Father    Breast cancer Sister    Cancer Sister        breast   Colon cancer Neg Hx    Esophageal cancer Neg Hx    Pancreatic cancer Neg Hx    Liver disease Neg Hx      Review of Systems  Constitutional: Negative.  Negative for chills and fever.  HENT: Negative.  Negative for congestion and sore throat.   Respiratory: Negative.  Negative for cough and shortness of breath.   Cardiovascular:  Negative for chest pain and palpitations.  Gastrointestinal:  Negative for abdominal pain, diarrhea, nausea and vomiting.  Genitourinary: Negative.   Skin:  Positive for itching and rash.  Neurological:  Negative for dizziness and headaches.  All other systems reviewed and are negative.  Today's Vitals   06/05/22 0859  BP: 120/74  Pulse: 62  Temp: (!) 97.5 F (36.4 C)  TempSrc: Oral  SpO2: 97%  Weight: 255 lb 6 oz (115.8 kg)  Height: _0  (1.549 m)   Body mass index is 48.25 kg/m.   Physical  Exam Vitals reviewed.  Constitutional:      Appearance: Normal appearance. She is obese.  HENT:     Head: Normocephalic.  Eyes:     Extraocular Movements: Extraocular movements intact.     Pupils: Pupils are equal, round, and reactive to light.  Cardiovascular:     Rate and Rhythm: Normal rate.  Pulmonary:     Effort: Pulmonary effort is normal.  Skin:    General: Skin is warm and dry.     Capillary Refill: Capillary refill takes less than 2 seconds.     Findings: Rash present.     Comments: Mild crusted over dry macular papular rash to right forearm.  No scratch marks visible.  No signs of infection.  Neurological:     Mental Status: She is alert and oriented to person, place, and time.  Psychiatric:        Mood and Affect: Mood normal.        Behavior: Behavior normal.      ASSESSMENT & PLAN: Problem List Items Addressed This Visit       Musculoskeletal and Integument   Rash and nonspecific skin eruption - Primary    Still present but much improved.  Symptomatic. No signs of infection. Recommend to use topical clobetasol twice a day for 3 to 4 days. Advised to contact the office if no better or worse during the next several days.      Relevant Medications   clobetasol cream (TEMOVATE) 0.05 %   Patient Instructions  Health Maintenance After Age 38 After age 109, you are at a higher risk for certain long-term diseases and infections as well as injuries from falls. Falls are a major  cause of broken bones and head injuries in people who are older than age 45. Getting regular preventive care can help to keep you healthy and well. Preventive care includes getting regular testing and making lifestyle changes as recommended by your health care provider. Talk with your health care provider about: Which screenings and tests you should have. A screening is a test that checks for a disease when you have no symptoms. A diet and exercise plan that is right for you. What should I  know about screenings and tests to prevent falls? Screening and testing are the best ways to find a health problem early. Early diagnosis and treatment give you the best chance of managing medical conditions that are common after age 29. Certain conditions and lifestyle choices may make you more likely to have a fall. Your health care provider may recommend: Regular vision checks. Poor vision and conditions such as cataracts can make you more likely to have a fall. If you wear glasses, make sure to get your prescription updated if your vision changes. Medicine review. Work with your health care provider to regularly review all of the medicines you are taking, including over-the-counter medicines. Ask your health care provider about any side effects that may make you more likely to have a fall. Tell your health care provider if any medicines that you take make you feel dizzy or sleepy. Strength and balance checks. Your health care provider may recommend certain tests to check your strength and balance while standing, walking, or changing positions. Foot health exam. Foot pain and numbness, as well as not wearing proper footwear, can make you more likely to have a fall. Screenings, including: Osteoporosis screening. Osteoporosis is a condition that causes the bones to get weaker and break more easily. Blood pressure screening. Blood pressure changes and medicines to control blood pressure can make you feel dizzy. Depression screening. You may be more likely to have a fall if you have a fear of falling, feel depressed, or feel unable to do activities that you used to do. Alcohol use screening. Using too much alcohol can affect your balance and may make you more likely to have a fall. Follow these instructions at home: Lifestyle Do not drink alcohol if: Your health care provider tells you not to drink. If you drink alcohol: Limit how much you have to: 0-1 drink a day for women. 0-2 drinks a day for  men. Know how much alcohol is in your drink. In the [place] one drink equals one 12 oz bottle of beer (355 mL), one 5 oz glass of wine (148 mL), or one 1 oz glass of hard liquor (44 mL). Do not use any products that contain nicotine or tobacco. These products include cigarettes, chewing tobacco, and vaping devices, such as e-cigarettes. If you need help quitting, ask your health care provider. Activity  Follow a regular exercise program to stay fit. This will help you maintain your balance. Ask your health care provider what types of exercise are appropriate for you. If you need a cane or walker, use it as recommended by your health care provider. Wear supportive shoes that have nonskid soles. Safety  Remove any tripping hazards, such as rugs, cords, and clutter. Install safety equipment such as grab bars in bathrooms and safety rails on stairs. Keep rooms and walkways well-lit. General instructions Talk with your health care provider about your risks for falling. Tell your health care provider if: You fall. Be sure to tell your health care  provider about all falls, even ones that seem minor. You feel dizzy, tiredness (fatigue), or off-balance. Take over-the-counter and prescription medicines only as told by your health care provider. These include supplements. Eat a healthy diet and maintain a healthy weight. A healthy diet includes low-fat dairy products, low-fat (lean) meats, and fiber from whole grains, beans, and lots of fruits and vegetables. Stay current with your vaccines. Schedule regular health, dental, and eye exams. Summary Having a healthy lifestyle and getting preventive care can help to protect your health and wellness after age 20. Screening and testing are the best way to find a health problem early and help you avoid having a fall. Early diagnosis and treatment give you the best chance for managing medical conditions that are more common for people who are older than age  47. Falls are a major cause of broken bones and head injuries in people who are older than age 60. Take precautions to prevent a fall at home. Work with your health care provider to learn what changes you can make to improve your health and wellness and to prevent falls. This information is not intended to replace advice given to you by your health care provider. Make sure you discuss any questions you have with your health care provider. Document Revised: 02/25/2021 Document Reviewed: 02/25/2021 Elsevier Patient Education  [place], MD [facility]

## 2022-06-05 NOTE — Patient Instructions (Signed)
Health Maintenance After Age 79 After age 79, you are at a higher risk for certain long-term diseases and infections as well as injuries from falls. Falls are a major cause of broken bones and head injuries in people who are older than age 79. Getting regular preventive care can help to keep you healthy and well. Preventive care includes getting regular testing and making lifestyle changes as recommended by your health care provider. Talk with your health care provider about: Which screenings and tests you should have. A screening is a test that checks for a disease when you have no symptoms. A diet and exercise plan that is right for you. What should I know about screenings and tests to prevent falls? Screening and testing are the best ways to find a health problem early. Early diagnosis and treatment give you the best chance of managing medical conditions that are common after age 79. Certain conditions and lifestyle choices may make you more likely to have a fall. Your health care provider may recommend: Regular vision checks. Poor vision and conditions such as cataracts can make you more likely to have a fall. If you wear glasses, make sure to get your prescription updated if your vision changes. Medicine review. Work with your health care provider to regularly review all of the medicines you are taking, including over-the-counter medicines. Ask your health care provider about any side effects that may make you more likely to have a fall. Tell your health care provider if any medicines that you take make you feel dizzy or sleepy. Strength and balance checks. Your health care provider may recommend certain tests to check your strength and balance while standing, walking, or changing positions. Foot health exam. Foot pain and numbness, as well as not wearing proper footwear, can make you more likely to have a fall. Screenings, including: Osteoporosis screening. Osteoporosis is a condition that causes  the bones to get weaker and break more easily. Blood pressure screening. Blood pressure changes and medicines to control blood pressure can make you feel dizzy. Depression screening. You may be more likely to have a fall if you have a fear of falling, feel depressed, or feel unable to do activities that you used to do. Alcohol use screening. Using too much alcohol can affect your balance and may make you more likely to have a fall. Follow these instructions at home: Lifestyle Do not drink alcohol if: Your health care provider tells you not to drink. If you drink alcohol: Limit how much you have to: 0-1 drink a day for women. 0-2 drinks a day for men. Know how much alcohol is in your drink. In the [place] one drink equals one 12 oz bottle of beer (355 mL), one 5 oz glass of wine (148 mL), or one 1 oz glass of hard liquor (44 mL). Do not use any products that contain nicotine or tobacco. These products include cigarettes, chewing tobacco, and vaping devices, such as e-cigarettes. If you need help quitting, ask your health care provider. Activity  Follow a regular exercise program to stay fit. This will help you maintain your balance. Ask your health care provider what types of exercise are appropriate for you. If you need a cane or walker, use it as recommended by your health care provider. Wear supportive shoes that have nonskid soles. Safety  Remove any tripping hazards, such as rugs, cords, and clutter. Install safety equipment such as grab bars in bathrooms and safety rails on stairs. Keep rooms and walkways   well-lit. General instructions Talk with your health care provider about your risks for falling. Tell your health care provider if: You fall. Be sure to tell your health care provider about all falls, even ones that seem minor. You feel dizzy, tiredness (fatigue), or off-balance. Take over-the-counter and prescription medicines only as told by your health care provider. These include  supplements. Eat a healthy diet and maintain a healthy weight. A healthy diet includes low-fat dairy products, low-fat (lean) meats, and fiber from whole grains, beans, and lots of fruits and vegetables. Stay current with your vaccines. Schedule regular health, dental, and eye exams. Summary Having a healthy lifestyle and getting preventive care can help to protect your health and wellness after age 79. Screening and testing are the best way to find a health problem early and help you avoid having a fall. Early diagnosis and treatment give you the best chance for managing medical conditions that are more common for people who are older than age 79. Falls are a major cause of broken bones and head injuries in people who are older than age 79. Take precautions to prevent a fall at home. Work with your health care provider to learn what changes you can make to improve your health and wellness and to prevent falls. This information is not intended to replace advice given to you by your health care provider. Make sure you discuss any questions you have with your health care provider. Document Revised: 02/25/2021 Document Reviewed: 02/25/2021 Elsevier Patient Education  2023 Elsevier Inc.  

## 2022-06-05 NOTE — Assessment & Plan Note (Signed)
Still present but much improved.  Symptomatic. No signs of infection. Recommend to use topical clobetasol twice a day for 3 to 4 days. Advised to contact the office if no better or worse during the next several days.

## 2022-06-06 NOTE — Telephone Encounter (Signed)
Attempted phone call to pt.  Per Epic ok to leave voicemail message.  Pt advised per Dr Caryl Comes drug reaction unlikely to be localized to one area.  Please proceed with dermatology referral and if needed we can stop Multaq after that.  Pt may contact RN at [phone].

## 2022-06-09 ENCOUNTER — Ambulatory Visit: Payer: Self-pay | Admitting: Licensed Clinical Social Worker

## 2022-06-09 NOTE — Patient Outreach (Signed)
  Care Coordination  Follow Up Visit Note   06/09/2022 Name: [name] MRN: [id number] DOB: [birth year]  [name] is a 81 y.o. year old female who sees Sagardia, Ines Bloomer, MD for primary care. I spoke with  [name] by phone today  What matters to the patients health and wellness today?  Getting Personal Care Services    Goals Addressed             This Visit's Progress    COMPLETED: Personal Care Services       Care Coordination Interventions: Solution-Focused Strategies employed:  Active listening / Reflection utilized  Assessed for barriers with PCS          SDOH assessments and interventions completed:  No   Care Coordination Interventions Activated:  Yes  Care Coordination Interventions:  Yes, provided   Follow up plan: No further intervention required. Patient will call office if needed.  Encounter Outcome:  Pt. Visit Completed   Casimer Lanius, [place] [phone]

## 2022-06-09 NOTE — Patient Instructions (Signed)
Visit Information  Thank you for taking time to visit with me today. Please don't hesitate to contact me if I can be of assistance to you.   Following are the goals we discussed today:   Goals Addressed             This Visit's Progress    COMPLETED: Personal Care Services       Care Coordination Interventions: Solution-Focused Strategies employed:  Active listening / Reflection utilized  Assessed for barriers with PCS         Please call the care guide team at [phone] if you need to cancel or reschedule your appointment.   If you are experiencing a Mental Health or [place] or need someone to talk to, please call the Suicide and Crisis Lifeline: 988 call 1-800-273-TALK (toll free, 24 hour hotline)   The patient verbalized understanding of instructions, educational materials, and care plan provided today and DECLINED offer to receive copy of patient instructions, educational materials, and care plan.   No further follow up required: by [place], [place] [phone]

## 2022-06-10 ENCOUNTER — Telehealth: Payer: Self-pay | Admitting: Emergency Medicine

## 2022-06-10 NOTE — Telephone Encounter (Signed)
[name] from [facility] is requesting a call. She said they need clarification on the instructions for Glucagon (GVOKE HYPOPEN 2-PACK) 0.5 MG/0.1ML SOAJ. She said this rx is typically not dosed daily. She is wanting to know if the instructions are correct.  Please advise  CB: [phone] Ref# [id number]

## 2022-06-11 NOTE — Telephone Encounter (Signed)
Called [facility] with provider response to medication clarification

## 2022-06-11 NOTE — Telephone Encounter (Signed)
This is to be used as needed in case she develops episodes of hypoglycemia.  It is not a daily thing just as needed.  Thanks.

## 2022-06-13 NOTE — Progress Notes (Signed)
HPI F never smoker followed for OSA, Asthma/ COPD, complicated by dCHF, Aortic Atherosclerosis, HTN, PAFib/Amiodarone/Eliquis, Varicose Veins, Restrictive Lung Disease, GERD, Nonalcoholic Fatty Liver, Hypothyroid, DM2/ neuropathy, GOUT, Morbid Obesity,  NPSG 06/05/11-AHI 20.1/ hr, desaturation to  87%, body weight 261 lbs, CPAP 10 PFT 03/25/17- moderately severe obstruction, insignificant response to BD, Nl TLC, DLCO mildly reduced. ==============================================================================  01/02/22-80 yoF never smoker followed for OSA, Asthma/ COPD, Chronic Cough, Lung Nodule, complicated by dCHF, Aortic Atherosclerosis, HTN, PAFib/Amiodarone/Eliquis, Varicose Veins, Restrictive Lung Disease, GERD, Nonalcoholic Fatty Liver, Hypothyroid, DM2/ neuropathy, GOUT, Morbid Obesity,            -no bronchodilators -  Hycodan, amiodarone, Breztri CPAP auto 5-20/ Luna/ [employer]    replaced 2017   replacement ordered 01/03/21   Download-compliance  Body weight today- Covid vax-no Flu vax-no Not using the ipratropium inhaler yet- wanted to discuss. She did not understand how to use the Atrovent inhaler-I demonstrated.  The idea was to have a noncardio stimulant bronchodilator, so she will try this. She has not been using CPAP much because she complains of runny nose.  We are sending azelastine nasal spray with discussion. She complains of increased shortness of breath only some days.  Today she says her breathing is normal for her.  We agreed there is probably her cardiac status that would change that quickly. She asked about oxygen status.  Arrival saturation on room air was 100% today.  We will check overnight oximetry on her CPAP. We reviewed the results of recent chest CT.  Nodule has diminished.  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  We will recheck CT in 4 months for comparison.  She is no longer taking amiodarone. CT Super D chest 01/01/22- IMPRESSION: 1. Ground-glass and  septal thickening in the chest most suggestive of sequela of heart failure given the small RIGHT-sided pleural effusion that has developed in the interim. Given mosaic pattern of attenuation would also consider the possibility of developing pulmonary drug toxicity in this patient with history of amiodarone therapy. Consider pulmonary consultation if not yet obtained with follow-up high-resolution chest CT as warranted. 2. Resolution of the RIGHT lower lobe nodule that was seen on previous imaging. 3. Waxing and waning size of mediastinal lymph nodes on the LEFT likely reactive and or related to underlying heart failure, not substantially changed since 2015. 4. Signs of amiodarone deposition in the liver. 5. 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 Could consider biochemical correlation if not yet performed given size of the lesion. 6. Aortic atherosclerosis and three-vessel coronary artery disease. 7. Signs of mitral annular calcification and three-vessel coronary artery disease. 8. 3.5 cm main pulmonary artery likely reflects changes of pulmonary arterial hypertension. Aortic Atherosclerosis (ICD10-I70.0).  06/16/22- 80 yoF never smoker followed for OSA, Asthma/ COPD, Chronic Cough, Lung Nodule, complicated by dCHF, Aortic Atherosclerosis, HTN, PAFib/Amiodarone/Eliquis, Varicose Veins, Restrictive Lung Disease, GERD, Nonalcoholic Fatty Liver, Hypothyroid, DM2/ neuropathy, GOUT, Morbid Obesity,            -no bronchodilators -  Hycodan, amiodarone, Breztri CPAP auto 5-20/ Luna/ [employer]    replaced 2017   replacement ordered 01/03/21   Download-compliance ([employer]) 30%, AHI 0.6/ hr Body weight today-255 lbs Covid vax-no ONOX on CPAP/ room air was ordered- not done Feels very well since diuresed. Breathing comfortable. Still sedentary. Denies cough or wheeze. Download reviewed. Compliance goals  reinforced. Shows me ecchymoses at injection sites on abd- she will discuss with her  diabetes doctor. HRCT chest 05/07/22- IMPRESSION: 1. Interval resolution of previously seen small bilateral pleural effusions as well as interlobular septal thickening seen in the lung bases, consistent with resolution of edema. Mild, bland appearing residual scarring and or atelectasis. No evidence of fibrotic interstitial lung disease. 2. Cardiomegaly and coronary artery disease. 3. Enlargement of the main pulmonary artery, as can be seen in pulmonary hypertension. Aortic Atherosclerosis (ICD10-I70.0).   ROS-see HPI   + = positive Constitutional:    weight loss, night sweats, fevers, chills, fatigue, lassitude. HEENT:    headaches, difficulty swallowing, tooth/dental problems, sore throat,       sneezing, itching, ear ache, nasal congestion, post nasal drip, snoring CV:    chest pain, orthopnea, PND, swelling in lower extremities, anasarca,                                   dizziness, palpitations Resp:   +shortness of breath with exertion or at rest.                productive cough,   non-productive cough, coughing up of blood.              change in color of mucus.  wheezing.   Skin:    rash or lesions. GI:  No-   heartburn, indigestion, abdominal pain, nausea, vomiting, diarrhea,                 change in bowel habits, loss of appetite GU: dysuria, change in color of urine, +urgency or frequency.   flank pain. MS:   joint pain, stiffness, decreased range of motion, back pain. Neuro-     nothing unusual Psych:  change in mood or affect.  depression or anxiety.   memory loss.  OBJ- Physical Exam General- Alert, Oriented, Affect-appropriate, Distress- none acute, + morbidly obese,  Skin- +ecchymoses abd from injections Lymphadenopathy- none Head- atraumatic            Eyes- Gross vision intact, PERRLA, conjunctivae and secretions clear,             Ears- Hearing, canals-normal            Nose-  Clear, no-Septal dev, mucus, polyps, erosion, perforation             Throat- Mallampati II-III , mucosa clear , drainage- none, tonsils- absent, + teeth Neck- flexible , trachea midline, no stridor , thyroid nl, carotid no bruit Chest - symmetrical excursion , unlabored           Heart/CV- RRR/ AFib , + murmur 1-2+S , no gallop  , no rub, nl s1 s2                           - JVD-none, edema- none, stasis changes- none, varices- none           Lung- clear to P&A, no rales or crackles, wheeze- none, cough- none , dullness-none, rub- none           Chest wall-  Abd-  Br/ Gen/ Rectal- Not done, not indicated Extrem- cyanosis- none, clubbing, none, atrophy- none, strength- nl Neuro- grossly intact to observation

## 2022-06-16 ENCOUNTER — Encounter: Payer: Self-pay | Admitting: Internal Medicine

## 2022-06-16 ENCOUNTER — Telehealth: Payer: Self-pay

## 2022-06-16 ENCOUNTER — Ambulatory Visit (INDEPENDENT_AMBULATORY_CARE_PROVIDER_SITE_OTHER): Payer: Medicare Other | Admitting: Internal Medicine

## 2022-06-16 DIAGNOSIS — G4733 Obstructive sleep apnea (adult) (pediatric): Secondary | ICD-10-CM

## 2022-06-16 DIAGNOSIS — Z9989 Dependence on other enabling machines and devices: Secondary | ICD-10-CM | POA: Diagnosis not present

## 2022-06-16 DIAGNOSIS — I5042 Chronic combined systolic (congestive) and diastolic (congestive) heart failure: Secondary | ICD-10-CM

## 2022-06-16 NOTE — Assessment & Plan Note (Signed)
Benefits from CPAP when used. Continuing to work on compliance Plan- continue CPAP 10

## 2022-06-16 NOTE — Telephone Encounter (Signed)
Patient states that she has 2 bruises on her stomach that she noticed on Friday.  Patient is not sure if it's from the Ozempic or Humalog.

## 2022-06-16 NOTE — Patient Instructions (Signed)
We can continue CPAP auto 5-20  Show your diabetes doctor where you are bruising from injections.  Glad you are feeling better

## 2022-06-17 NOTE — Telephone Encounter (Signed)
Informed the patient and she expressed her understanding

## 2022-06-25 ENCOUNTER — Ambulatory Visit: Payer: Self-pay | Admitting: Licensed Clinical Social Worker

## 2022-06-25 NOTE — Patient Outreach (Signed)
  Care Coordination   Follow Up Visit Note   06/25/2022 Name: [name] MRN: [id number] DOB: May 26, 1941  [name] is a 81 y.o. year old female who sees Sagardia, Ines Bloomer, MD for primary care. I spoke with  [name] by phone today.  What matters to the patients health and wellness today?  Getter her aide back for personal care services  Reports her aide stopped coming because her Medicaid stopped  .   Recommendation: Patient may benefit from, and is in agreement to Call [place] to find out why the Medicaid stopped Find out if she needs to reapply in [facility] / [facility] or [place].     Goals Addressed             This Visit's Progress    Personal Care Services       Care Coordination Interventions: Advised patient to Call DSS to find out why her Medicaid stopped Provided contact information for [place] and [facility] DSS Solution-Focused Strategies employed:  Active listening / Reflection utilized  Problem [place] strategies reviewed Assessed barriers           SDOH assessments and interventions completed:  No   Care Coordination Interventions Activated:  Yes  Care Coordination Interventions:  Yes, provided   Follow up plan:  No further intervention required. Patient states she will call LCSW when needed.    Encounter Outcome:  Pt. Visit Completed   Casimer Lanius, [place] [phone]

## 2022-06-25 NOTE — Patient Instructions (Signed)
Visit Information  Thank you for taking time to visit with me today. Please don't hesitate to contact me if I can be of assistance to you.   Following are the goals we discussed today:   Goals Addressed             This Visit's Progress    Personal Care Services       Care Coordination Interventions: Advised patient to Call DSS to find out why her Medicaid stopped Provided contact information for [place] and [facility] DSS Solution-Focused Strategies employed:  Active listening / Reflection utilized  Problem Solving [place] strategies reviewed Assessed barriers          Please call the care guide team at [phone] if you need to cancel or reschedule your appointment.    The patient verbalized understanding of instructions, educational materials, and care plan provided today and DECLINED offer to receive copy of patient instructions, educational materials, and care plan.   The care management team will reach out to the patient again over the next 30 to 45 days. Patient will call if needed prior to next encounter.  Casimer Lanius, [place] [phone]

## 2022-06-26 ENCOUNTER — Encounter: Payer: Self-pay | Admitting: Internal Medicine

## 2022-06-26 NOTE — Assessment & Plan Note (Signed)
Actively diuresed and feeling better now. Plan-continue following with cardiology

## 2022-06-26 NOTE — Assessment & Plan Note (Signed)
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 Plan-continue working on weight loss

## 2022-07-04 ENCOUNTER — Telehealth: Payer: Self-pay | Admitting: Internal Medicine

## 2022-07-04 NOTE — Telephone Encounter (Signed)
Pt c/o swelling: STAT is pt has developed SOB within 24 hours  If swelling, where is the swelling located? N/A  How much weight have you gained and in what time span? 5 lbs in 2 weeks  Have you gained 3 pounds in a day or 5 pounds in a week? Maybe   Do you have a log of your daily weights (if so, list)? No   Are you currently taking a fluid pill? Yes   Are you currently SOB? No   Have you traveled recently? No

## 2022-07-04 NOTE — Telephone Encounter (Addendum)
Spoke with pt who states she currently weighs 259 as of this morning.  She thinks she has gained about 5 pounds over the past 2 weeks.  She denies swelling in her hands, feet, legs or stomach or SOB.  She states she continues to take Torsemide '20mg'$  and Spironolactone '25mg'$ .  She states she is urinating a large amount.  She follows a low sodium diet.  BP and HR have been WNL.   Pt advised to continue daily weights and BP.  Notify RN for weight increase 3 pounds or greater in 1 day or 5 pounds in a week.  Continue current medications and follow up with PCP and cardiology as scheduled.  Pt verbalizes understanding and agrees with current plan.

## 2022-07-05 DIAGNOSIS — G4733 Obstructive sleep apnea (adult) (pediatric): Secondary | ICD-10-CM | POA: Diagnosis not present

## 2022-07-05 DIAGNOSIS — R0683 Snoring: Secondary | ICD-10-CM | POA: Diagnosis not present

## 2022-07-05 DIAGNOSIS — I10 Essential (primary) hypertension: Secondary | ICD-10-CM | POA: Diagnosis not present

## 2022-07-09 ENCOUNTER — Encounter: Payer: Self-pay | Admitting: Emergency Medicine

## 2022-07-09 ENCOUNTER — Ambulatory Visit (INDEPENDENT_AMBULATORY_CARE_PROVIDER_SITE_OTHER): Payer: Medicare Other | Admitting: Emergency Medicine

## 2022-07-09 VITALS — BP 134/82 | HR 69 | Temp 97.7°F | Ht 61.0 in | Wt 256.0 lb

## 2022-07-09 DIAGNOSIS — S8001XA Contusion of right knee, initial encounter: Secondary | ICD-10-CM | POA: Insufficient documentation

## 2022-07-09 NOTE — Assessment & Plan Note (Signed)
Clinical stable.  No concerns. Most likely secondary to minor trauma. On long-term anticoagulation with Eliquis. Slowly getting better. No specific treatment needed at this time. Advised to contact the office if no better or worse during the next several days.

## 2022-07-09 NOTE — Patient Instructions (Signed)
Hematoma A hematoma is a collection of blood. A hematoma can happen: Under the skin. In an organ. In a body space. In a joint space. In other tissues. The blood can thicken (clot) to form a lump that you can see and feel. The lump is often hard and may become sore and tender. The lump can be very small or very big. Most hematomas get better in a few days to weeks. However, some hematomas may be serious and need medical care. What are the causes? This condition is caused by: An injury. Blood that leaks under the skin. Problems from surgeries. Medical conditions that cause bleeding or bruising. What increases the risk? You are more likely to develop this condition if: You are an older adult. You use medicines that thin your blood. What are the signs or symptoms? Symptoms depend on where the hematoma is in your body. If the hematoma is under the skin, there is: A firm lump on the body. Pain and tenderness in the area. Bruising. The skin above the lump may be blue, dark blue, purple-red, or yellowish. If the hematoma is deep in the tissues or body spaces, there may be: Blood in the stomach. This may cause pain in the belly (abdomen), weakness, passing out (fainting), and shortness of breath. Blood in the head. This may cause a headache, weakness, trouble speaking or understanding speech, or passing out. How is this diagnosed? This condition is diagnosed based on: Your medical history. A physical exam. Imaging tests, such as ultrasound or CT scan. Blood tests. How is this treated? Treatment depends on the cause, size, and location of the hematoma. Treatment may include: Doing nothing. Many hematomas go away on their own without treatment. Surgery or close monitoring. This may be needed for large hematomas or hematomas that affect the body's organs. Medicines. These may be given if a medical condition caused the hematoma. Follow these instructions at home: Managing pain, stiffness,  and swelling  If told, put ice on the area. Put ice in a plastic bag. Place a towel between your skin and the bag. Leave the ice on for 20 minutes, 2-3 times a day for the first two days. If told, put heat on the affected area after putting ice on the area for two days. Use the heat source that your doctor tells you to use. This could be a moist heat pack or a heating pad. To do this: Place a towel between your skin and the heat source. Leave the heat on for 20-30 minutes. Remove the heat if your skin turns bright red. This is very important if you are unable to feel pain, heat, or cold. You may have a greater risk of getting burned. Raise (elevate) the affected area above the level of your heart while you are sitting or lying down. Wrap the affected area with an elastic bandage, if told by your doctor. Do not wrap the bandage too tightly. If your hematoma is on a leg or foot and is painful, your doctor may give you crutches. Use them as told by your doctor. General instructions Take over-the-counter and prescription medicines only as told by your doctor. Keep all follow-up visits as told by your doctor. This is important. Contact a doctor if: You have a fever. The swelling or bruising gets worse. You start to get more hematomas. Get help right away if: Your pain gets worse. Your pain is not getting better with medicine. Your skin over the hematoma breaks or starts to bleed.   Your hematoma is in your chest or belly and you: Pass out. Feel weak. Become short of breath. You have a hematoma on your scalp that is caused by a fall or injury, and you: Have a headache that gets worse. Have trouble speaking or understanding speech. Become less alert or you pass out. Summary A hematoma is a collection of blood in any part of your body. Most hematomas get better on their own in a few days to weeks. Some may need medical care. Follow instructions from your doctor about how to care for your  hematoma. Contact a doctor if the swelling or bruising gets worse, or if you are short of breath. This information is not intended to replace advice given to you by your health care provider. Make sure you discuss any questions you have with your health care provider. Document Revised: 08/01/2021 Document Reviewed: 08/01/2021 Elsevier Patient Education  2023 [employer]  

## 2022-07-09 NOTE — Progress Notes (Signed)
[name] 81 y.o.   Chief Complaint  Patient presents with   bruise on knee    On right knee, noticed it on Monday    Rash    HISTORY OF PRESENT ILLNESS: Acute problem visit today. This is a 81 y.o. female complaining of bruising to right knee noticed last Sunday. Patient on Eliquis for chronic atrial fibrillation. Chronic itchy rash but much improved. No other complaints or medical concerns today.   Rash Pertinent negatives include no congestion, cough, diarrhea, fever, shortness of breath, sore throat or vomiting.     Prior to Admission medications   Medication Sig Start Date End Date Taking? Authorizing Provider  Accu-Chek Softclix Lancets lancets 1 each by Other route 3 (three) times daily. as directed 03/30/20  Yes Maximiano Coss, NP  allopurinol (ZYLOPRIM) 300 MG tablet TAKE 1 TABLET(300 MG) BY MOUTH DAILY 10/18/21  Yes Willem Klingensmith, Ines Bloomer, MD  apixaban (ELIQUIS) 5 MG TABS tablet Take 1 tablet (5 mg total) by mouth 2 (two) times daily. 03/31/22  Yes [name], MD  azelastine (ASTELIN) 0.1 % nasal spray 1-2 puffs each nostril at bedtime as needed 01/02/22  Yes Young, Tarri Fuller D, MD  b complex vitamins capsule Take 1 capsule by mouth daily.   Yes [provider]  blood glucose meter kit and supplies Dispense based on patient and insurance preference. Use up to four times daily as directed. (FOR ICD-10 E10.9, E11.9). 08/29/21  Yes Tanvi Gatling, Ines Bloomer, MD  BREZTRI AEROSPHERE 160-9-4.8 MCG/ACT AERO USE 2 INHALATIONS BY MOUTH IN  THE MORNING AND AT BEDTIME 11/12/21  Yes Cobb, Karie Schwalbe, NP  cholecalciferol (VITAMIN D3) 25 MCG (1000 UNIT) tablet Take 1,000 Units by mouth daily.   Yes [provider]  clobetasol cream (TEMOVATE) 2.13 % Apply 1 Application topically 2 (two) times daily. 06/05/22  Yes Cassie Henkels, Ines Bloomer, MD  cloNIDine (CATAPRES) 0.3 MG tablet TAKE 1 TABLET BY MOUTH  TWICE DAILY 01/28/22  Yes Ianmichael Amescua, Ines Bloomer, MD  Continuous Blood  Gluc Receiver (FREESTYLE LIBRE 14 DAY READER) DEVI Use as directed. 08/29/21  Yes Hong Moring, Ines Bloomer, MD  Continuous Blood Gluc Sensor (FREESTYLE LIBRE 2 SENSOR) MISC USE AS DIRECTED TO CHECK BLOOD  SUGAR DAILY 04/06/22  Yes Horald Pollen, MD  dronedarone (MULTAQ) 400 MG tablet Take 1 tablet (400 mg total) by mouth 2 (two) times daily. 12/19/21  Yes [name], MD  Glucagon (GVOKE HYPOPEN 2-PACK) 0.5 MG/0.1ML SOAJ Inject 0.5 mg into the skin daily as needed. For Hypoglycemic events 05/30/22  Yes Cabrini Ruggieri, Ines Bloomer, MD  hydrOXYzine (ATARAX) 25 MG tablet Take 1 tablet (25 mg total) by mouth every 8 (eight) hours as needed. 05/29/22  Yes Ashdon Gillson, Ines Bloomer, MD  Insulin Lispro Prot & Lispro (HUMALOG MIX 75/25 KWIKPEN) (75-25) 100 UNIT/ML Kwikpen Inject 25 Units into the skin 2 (two) times daily. Patient taking differently: Inject 15-20 Units into the skin 2 (two) times daily. 10/04/21  Yes [name], Melanie Crazier, MD  ipratropium (ATROVENT HFA) 17 MCG/ACT inhaler Inhale 2 puffs into the lungs every 6 (six) hours as needed for wheezing. 12/17/21 12/17/22 Yes Baird Lyons D, MD  ketorolac (ACULAR) 0.5 % ophthalmic solution SMARTSIG:In Eye(s) 08/22/21  Yes [provider]  levothyroxine (SYNTHROID) 100 MCG tablet Take 1 tablet (100 mcg total) by mouth daily. 03/18/22  Yes Itha Kroeker, Ines Bloomer, MD  lidocaine (LIDODERM) 5 % Place 1 patch onto the skin daily. Remove & Discard patch within 12 hours or as directed  by MD 08/29/20  Yes Mcarthur Rossetti, MD  NOVOFINE PLUS PEN NEEDLE 32G X 4 MM MISC USE TWICE DAILY AS DIRECTED 04/08/21  Yes Annaliyah Willig, Ines Bloomer, MD  [facility] VERIO test strip 1 each by Other route 3 (three) times daily. 06/08/20  Yes [provider]  RESTASIS MULTIDOSE 0.05 % ophthalmic emulsion Place 1 drop into both eyes 2 (two) times daily.  03/04/20  Yes [provider]  rosuvastatin (CRESTOR) 10 MG tablet TAKE 1 TABLET BY MOUTH IN  THE EVENING  04/03/22  Yes Cassadee Vanzandt, Glide, MD  Semaglutide, 2 MG/DOSE, (OZEMPIC, 2 MG/DOSE,) 8 MG/3ML SOPN Inject 2 mg into the skin once a week. 02/07/22  Yes [name], Melanie Crazier, MD  spironolactone (ALDACTONE) 25 MG tablet TAKE 1 TABLET BY MOUTH DAILY 04/28/22  Yes [name], Melanie Crazier, MD  TOBRADEX ophthalmic ointment SMARTSIG:Sparingly In Eye(s) Every Night 08/25/21  Yes [provider]  torsemide (DEMADEX) 20 MG tablet Take 1 tablet (20 mg total) by mouth daily. 05/30/21  Yes Sherran Needs, NP  triamcinolone cream (KENALOG) 0.1 % Apply 1 application topically 2 (two) times daily. 07/26/20  Yes Jossue Rubenstein, Ines Bloomer, MD  vitamin B-12 (CYANOCOBALAMIN) 1000 MCG tablet Take 1,000 mcg by mouth daily.   Yes [provider]  metoprolol tartrate (LOPRESSOR) 25 MG tablet Take 1 tablet (25 mg total) by mouth 2 (two) times daily. 12/11/21 03/11/22  Horald Pollen, MD  potassium chloride (KLOR-CON) 10 MEQ tablet Take 4 tablets (40 mEq total) by mouth daily. 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   Coronary artery disease involving native coronary artery of native heart without angina pectoris 02/12/2022   Adrenal adenoma, right 02/07/2022   Skin lesion of right arm 02/02/2022   Asthma 11/01/2021   Normocytic anemia 10/11/2021   Nodule of right lung 09/30/2021   Dysthymia 03/12/2021   Primary osteoarthritis involving multiple joints 03/01/2021   Recurrent falls 01/10/2021   Sensorineural hearing loss (SNHL), 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   Persistent atrial fibrillation ([place]) 08/12/2020   Type 2 diabetes mellitus with hyperglycemia, with long-term current use of insulin ([place]) 06/21/2020   Type 2 diabetes mellitus with diabetic polyneuropathy, with long-term current  use of insulin ([place]) 06/21/2020   Mixed hyperlipidemia 06/21/2020   Uncontrolled hypertension 06/11/2020   Aortic atherosclerosis ([place]) 03/28/2020   Diabetic peripheral neuropathy ([place]) 12/26/2019   Gastroesophageal reflux disease without esophagitis 03/17/2019   Rash and nonspecific skin eruption 03/17/2019   Bilateral leg edema 02/16/2019   Pruritus 02/16/2019   Varicose veins of both legs with edema 02/16/2019   Heart murmur 09/05/2018   Restrictive lung disease 02/22/2018   Dysgeusia 06/11/2017   Osteoarthritis of left knee 06/03/2017   NAFLD (nonalcoholic fatty liver disease) 08/07/2016   Osteoporosis 12/31/2015   Obesity 12/19/2015   Edema 10/18/2015   Acquired hypothyroidism 10/18/2015   Prolonged QT interval 10/03/2013   CHF (congestive heart failure) ([place]) 04/21/2013   Long term (current) use of anticoagulants 06/09/2012   BENIGN NEOPLASM OF ADRENAL GLAND 11/25/2010   Chronic diastolic heart failure ([place]) 02/20/2010   DM 05/16/2009   GOUT 05/16/2009   OBESITY, MORBID 05/16/2009   Hypertension associated with diabetes ([place]) 05/16/2009   Paroxysmal atrial fibrillation ([place]) 05/16/2009   HYPERLIPIDEMIA 11/30/2008  Obstructive sleep apnea on CPAP 11/30/2008    Past Medical History:  Diagnosis Date   Arthritis    Back pain    Chronic anticoagulation    due to aflutter   Chronic kidney disease    Diabetes mellitus    Diastolic CHF, chronic ([place])    a.  echo 2006 - ef 55-65%; mild diast dysfxn;    b. Echo 08/2011: Mild LVH, EF 60%;  c. 04/2013 Echo: EF 65-69%, mild conc LVH;  08/2014 Echo: EF 60-65%, mild-mod MR.   Gout    Hyperlipidemia    Hypertension    a.  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    Past Surgical History:  Procedure Laterality Date   APPENDECTOMY     ATRIAL FLUTTER ABLATION N/A  09/24/2011   Procedure: ATRIAL FLUTTER ABLATION;  Surgeon: [name], MD;  Location: [facility] CATH LAB;  Service: Cardiovascular;  Laterality: N/A;   CARDIOVERSION  10/22/2011   Procedure: CARDIOVERSION;  Surgeon: [name], MD;  Location: [place];  Service: Cardiovascular;  Laterality: N/A;   CARDIOVERSION N/A 09/10/2011   Procedure: CARDIOVERSION;  Surgeon: [name], MD;  Location[facility] C[facility] LAB;  Service: Cardiovascular;  Laterality: N/A;   CHOLECYSTECTOMY     COLONOSCOPY WITH PROPOFOL N/A 06/13/2021   Procedure: COLONOSCOPY WITH PROPOFOL;  Surgeon: Milus Banister, MD;  Location: [facility];  Service: Endoscopy;  Laterality: N/A;   POLYPECTOMY  06/13/2021   Procedure: POLYPECTOMY;  Surgeon: Milus Banister, MD;  Location: [facility];  Service: Endoscopy;;   TONSILLECTOMY  1982   TOTAL ABDOMINAL HYSTERECTOMY      Social History   Socioeconomic History   Marital status: Married    Spouse name: Not on file   Number of children: 3   Years of education: Not on file   Highest education level: Not on file  Occupational History   Occupation: DISABILITY/housewife    Employer: RETIRED  Tobacco Use   Smoking status: Never   Smokeless tobacco: Never  Vaping Use   Vaping Use: Never used  Substance and Sexual Activity   Alcohol use: No   Drug use: No   Sexual activity: Yes  Other Topics Concern   Not on file  Social History Narrative   Not on file   Social Determinants of Health   Financial Resource Strain: Low Risk  (11/08/2021)   Overall Financial Resource Strain (CARDIA)    Difficulty of Paying Living Expenses: Not hard at all  Food Insecurity: No Food Insecurity (06/02/2022)   Hunger Vital Sign    Worried About Running Out of Food in the Last Year: Never true    Ran Out of Food in the Last Year: Never true  Transportation Needs: No Transportation Needs (06/02/2022)   PRAPARE - Hydrologist (Medical): No    Lack of Transportation  (Non-Medical): No  Physical Activity: Inactive (11/08/2021)   Exercise Vital Sign    Days of Exercise per Week: 0 days    Minutes of Exercise per Session: 0 min  Stress: No Stress Concern Present (11/08/2021)   [place]    Feeling of Stress : Not at all  Social Connections: [place] (11/08/2021)   Social Connection and Isolation Panel [NHANES]  Frequency of Communication with Friends and Family: More than three times a week    Frequency of Social Gatherings with Friends and Family: Never    Attends Religious Services: More than 4 times per year    Active Member of [employer] or Organizations: Yes    Attends Music therapist: More than 4 times per year    Marital Status: Married  Human resources officer Violence: Not At Risk (11/08/2021)   Humiliation, Afraid, Rape, and Kick questionnaire    Fear of Current or Ex-Partner: No    Emotionally Abused: No    Physically Abused: No    Sexually Abused: No    Family History  Problem Relation Age of Onset   Heart disease Father    Hypertension Father    Breast cancer Sister    Cancer Sister        breast   Colon cancer Neg Hx    Esophageal cancer Neg Hx    Pancreatic cancer Neg Hx    Liver disease Neg Hx      Review of Systems  Constitutional: Negative.  Negative for chills and fever.  HENT: Negative.  Negative for congestion and sore throat.   Respiratory: Negative.  Negative for cough and shortness of breath.   Cardiovascular: Negative.  Negative for chest pain and palpitations.  Gastrointestinal:  Negative for abdominal pain, diarrhea, nausea and vomiting.  Genitourinary: Negative.   Skin:  Positive for rash.  Neurological:  Negative for dizziness and headaches.   Today's Vitals   07/09/22 1435  BP: 134/82  Pulse: 69  Temp: 97.7 F (36.5 C)  TempSrc: Oral  SpO2: 95%  Weight: 256 lb (116.1 kg)  Height: '5\' 1"'  (1.549 m)   Body mass index is  48.37 kg/m.   Physical Exam Vitals reviewed.  Constitutional:      Appearance: Normal appearance.  HENT:     Head: Normocephalic.  Eyes:     Extraocular Movements: Extraocular movements intact.     Pupils: Pupils are equal, round, and reactive to light.  Cardiovascular:     Rate and Rhythm: Normal rate and regular rhythm.     Pulses: Normal pulses.     Heart sounds: Normal heart sounds.  Pulmonary:     Effort: Pulmonary effort is normal.     Breath sounds: Normal breath sounds.  Musculoskeletal:     Cervical back: No tenderness.  Lymphadenopathy:     Cervical: No cervical adenopathy.  Skin:    General: Skin is warm and dry.     Capillary Refill: Capillary refill takes less than 2 seconds.     Comments: Right knee: Mild swelling with ecchymotic area.  Full range of motion.  Mild tenderness.  Neurological:     General: No focal deficit present.     Mental Status: She is alert and oriented to person, place, and time.  Psychiatric:        Mood and Affect: Mood normal.        Behavior: Behavior normal.      ASSESSMENT & PLAN: A total of 34 minutes was spent with the patient and counseling/coordination of care regarding preparing for this visit, review of most recent office visit notes, review of multiple chronic medical problems and their management, review of all medications, diagnosis of right knee hematoma and management, long-term anticoagulation precautions, prognosis, documentation, and need for follow-up.  Problem List Items Addressed This Visit       Other   Hematoma of right knee region -  Primary    Clinical stable.  No concerns. Most likely secondary to minor trauma. On long-term anticoagulation with Eliquis. Slowly getting better. No specific treatment needed at this time. Advised to contact the office if no better or worse during the next several days.      Patient Instructions  Hematoma A hematoma is a collection of blood. A hematoma can happen: Under  the skin. In an organ. In a body space. In a joint space. In other tissues. The blood can thicken (clot) to form a lump that you can see and feel. The lump is often hard and may become sore and tender. The lump can be very small or very big. Most hematomas get better in a few days to weeks. However, some hematomas may be serious and need medical care. What are the causes? This condition is caused by: An injury. Blood that leaks under the skin. Problems from surgeries. Medical conditions that cause bleeding or bruising. What increases the risk? You are more likely to develop this condition if: You are an older adult. You use medicines that thin your blood. What are the signs or symptoms? Symptoms depend on where the hematoma is in your body. If the hematoma is under the skin, there is: A firm lump on the body. Pain and tenderness in the area. Bruising. The skin above the lump may be blue, dark blue, purple-red, or yellowish. If the hematoma is deep in the tissues or body spaces, there may be: Blood in the stomach. This may cause pain in the belly (abdomen), weakness, passing out (fainting), and shortness of breath. Blood in the head. This may cause a headache, weakness, trouble speaking or understanding speech, or passing out. How is this diagnosed? This condition is diagnosed based on: Your medical history. A physical exam. Imaging tests, such as ultrasound or CT scan. Blood tests. How is this treated? Treatment depends on the cause, size, and location of the hematoma. Treatment may include: Doing nothing. Many hematomas go away on their own without treatment. Surgery or close monitoring. This may be needed for large hematomas or hematomas that affect the body's organs. Medicines. These may be given if a medical condition caused the hematoma. Follow these instructions at home: Managing pain, stiffness, and swelling  If told, put ice on the area. Put ice in a plastic  bag. Place a towel between your skin and the bag. Leave the ice on for 20 minutes, 2-3 times a day for the first two days. If told, put heat on the affected area after putting ice on the area for two days. Use the heat source that your doctor tells you to use. This could be a moist heat pack or a heating pad. To do this: Place a towel between your skin and the heat source. Leave the heat on for 20-30 minutes. Remove the heat if your skin turns bright red. This is very important if you are unable to feel pain, heat, or cold. You may have a greater risk of getting burned. Raise (elevate) the affected area above the level of your heart while you are sitting or lying down. Wrap the affected area with an elastic bandage, if told by your doctor. Do not wrap the bandage too tightly. If your hematoma is on a leg or foot and is painful, your doctor may give you crutches. Use them as told by your doctor. General instructions Take over-the-counter and prescription medicines only as told by your doctor. Keep all follow-up visits as  told by your doctor. This is important. Contact a doctor if: You have a fever. The swelling or bruising gets worse. You start to get more hematomas. Get help right away if: Your pain gets worse. Your pain is not getting better with medicine. Your skin over the hematoma breaks or starts to bleed. Your hematoma is in your chest or belly and you: Pass out. Feel weak. Become short of breath. You have a hematoma on your scalp that is caused by a fall or injury, and you: Have a headache that gets worse. Have trouble speaking or understanding speech. Become less alert or you pass out. Summary A hematoma is a collection of blood in any part of your body. Most hematomas get better on their own in a few days to weeks. Some may need medical care. Follow instructions from your doctor about how to care for your hematoma. Contact a doctor if the swelling or bruising gets worse, or  if you are short of breath. This information is not intended to replace advice given to you by your health care provider. Make sure you discuss any questions you have with your health care provider. Document Revised: 08/01/2021 Document Reviewed: 08/01/2021 Elsevier Patient Education  [place], MD [facility]

## 2022-07-30 ENCOUNTER — Ambulatory Visit: Payer: Medicare Other | Admitting: Podiatry

## 2022-07-30 ENCOUNTER — Other Ambulatory Visit: Payer: Self-pay | Admitting: Emergency Medicine

## 2022-07-30 DIAGNOSIS — M109 Gout, unspecified: Secondary | ICD-10-CM

## 2022-08-01 ENCOUNTER — Other Ambulatory Visit: Payer: Self-pay | Admitting: Emergency Medicine

## 2022-08-01 DIAGNOSIS — E1142 Type 2 diabetes mellitus with diabetic polyneuropathy: Secondary | ICD-10-CM

## 2022-08-04 ENCOUNTER — Telehealth: Payer: Self-pay

## 2022-08-04 ENCOUNTER — Ambulatory Visit: Payer: Medicare Other | Admitting: Podiatry

## 2022-08-04 DIAGNOSIS — I10 Essential (primary) hypertension: Secondary | ICD-10-CM | POA: Diagnosis not present

## 2022-08-04 DIAGNOSIS — R0683 Snoring: Secondary | ICD-10-CM | POA: Diagnosis not present

## 2022-08-04 DIAGNOSIS — G4733 Obstructive sleep apnea (adult) (pediatric): Secondary | ICD-10-CM | POA: Diagnosis not present

## 2022-08-04 NOTE — Telephone Encounter (Signed)
error 

## 2022-08-12 ENCOUNTER — Ambulatory Visit (INDEPENDENT_AMBULATORY_CARE_PROVIDER_SITE_OTHER): Payer: Medicare Other | Admitting: Emergency Medicine

## 2022-08-12 VITALS — BP 124/72 | HR 72 | Temp 98.2°F | Ht 61.0 in | Wt 257.0 lb

## 2022-08-12 DIAGNOSIS — Z7901 Long term (current) use of anticoagulants: Secondary | ICD-10-CM

## 2022-08-12 DIAGNOSIS — I10 Essential (primary) hypertension: Secondary | ICD-10-CM | POA: Diagnosis not present

## 2022-08-12 DIAGNOSIS — M549 Dorsalgia, unspecified: Secondary | ICD-10-CM

## 2022-08-12 DIAGNOSIS — G4733 Obstructive sleep apnea (adult) (pediatric): Secondary | ICD-10-CM | POA: Diagnosis not present

## 2022-08-12 DIAGNOSIS — M25551 Pain in right hip: Secondary | ICD-10-CM | POA: Diagnosis not present

## 2022-08-12 MED ORDER — NYSTATIN 100000 UNIT/GM EX POWD
1.0000 | Freq: Two times a day (BID) | CUTANEOUS | 7 refills | Status: DC
Start: 2022-08-12 — End: 2023-11-24

## 2022-08-12 MED ORDER — HYDROXYZINE HCL 10 MG PO TABS: 10.0000 mg | ORAL_TABLET | Freq: Four times a day (QID) | ORAL | 1 refills | Status: DC | PRN

## 2022-08-12 MED ORDER — HYDROCODONE-ACETAMINOPHEN 5-325 MG PO TABS: 1.0000 | ORAL_TABLET | Freq: Four times a day (QID) | ORAL | 0 refills | Status: DC | PRN

## 2022-08-12 NOTE — Patient Instructions (Signed)
Acute Back Pain, Adult Acute back pain is sudden and usually short-lived. It is often caused by an injury to the muscles and tissues in the back. The injury may result from: A muscle, tendon, or ligament getting overstretched or torn. Ligaments are tissues that connect bones to each other. Lifting something improperly can cause a back strain. Wear and tear (degeneration) of the spinal disks. Spinal disks are circular tissue that provide cushioning between the bones of the spine (vertebrae). Twisting motions, such as while playing sports or doing yard work. A hit to the back. Arthritis. You may have a physical exam, lab tests, and imaging tests to find the cause of your pain. Acute back pain usually goes away with rest and home care. Follow these instructions at home: Managing pain, stiffness, and swelling Take over-the-counter and prescription medicines only as told by your health care provider. Treatment may include medicines for pain and inflammation that are taken by mouth or applied to the skin, or muscle relaxants. Your health care provider may recommend applying ice during the first 24-48 hours after your pain starts. To do this: Put ice in a plastic bag. Place a towel between your skin and the bag. Leave the ice on for 20 minutes, 2-3 times a day. Remove the ice if your skin turns bright red. This is very important. If you cannot feel pain, heat, or cold, you have a greater risk of damage to the area. If directed, apply heat to the affected area as often as told by your health care provider. Use the heat source that your health care provider recommends, such as a moist heat pack or a heating pad. Place a towel between your skin and the heat source. Leave the heat on for 20-30 minutes. Remove the heat if your skin turns bright red. This is especially important if you are unable to feel pain, heat, or cold. You have a greater risk of getting burned. Activity  Do not stay in bed. Staying in  bed for more than 1-2 days can delay your recovery. Sit up and stand up straight. Avoid leaning forward when you sit or hunching over when you stand. If you work at a desk, sit close to it so you do not need to lean over. Keep your chin tucked in. Keep your neck drawn back, and keep your elbows bent at a 90-degree angle (right angle). Sit high and close to the steering wheel when you drive. Add lower back (lumbar) support to your car seat, if needed. Take short walks on even surfaces as soon as you are able. Try to increase the length of time you walk each day. Do not sit, drive, or stand in one place for more than 30 minutes at a time. Sitting or standing for long periods of time can put stress on your back. Do not drive or use heavy machinery while taking prescription pain medicine. Use proper lifting techniques. When you bend and lift, use positions that put less stress on your back: Bend your knees. Keep the load close to your body. Avoid twisting. Exercise regularly as told by your health care provider. Exercising helps your back heal faster and helps prevent back injuries by keeping muscles strong and flexible. Work with a physical therapist to make a safe exercise program, as recommended by your health care provider. Do any exercises as told by your physical therapist. Lifestyle Maintain a healthy weight. Extra weight puts stress on your back and makes it difficult to have good   posture. Avoid activities or situations that make you feel anxious or stressed. Stress and anxiety increase muscle tension and can make back pain worse. Learn ways to manage anxiety and stress, such as through exercise. General instructions Sleep on a firm mattress in a comfortable position. Try lying on your side with your knees slightly bent. If you lie on your back, put a pillow under your knees. Keep your head and neck in a straight line with your spine (neutral position) when using electronic equipment like  smartphones or pads. To do this: Raise your smartphone or pad to look at it instead of bending your head or neck to look down. Put the smartphone or pad at the level of your face while looking at the screen. Follow your treatment plan as told by your health care provider. This may include: Cognitive or behavioral therapy. Acupuncture or massage therapy. Meditation or yoga. Contact a health care provider if: You have pain that is not relieved with rest or medicine. You have increasing pain going down into your legs or buttocks. Your pain does not improve after 2 weeks. You have pain at night. You lose weight without trying. You have a fever or chills. You develop nausea or vomiting. You develop abdominal pain. Get help right away if: You develop new bowel or bladder control problems. You have unusual weakness or numbness in your arms or legs. You feel faint. These symptoms may represent a serious problem that is an emergency. Do not wait to see if the symptoms will go away. Get medical help right away. Call your local emergency services (911 in the [place] Do not drive yourself to the hospital. Summary Acute back pain is sudden and usually short-lived. Use proper lifting techniques. When you bend and lift, use positions that put less stress on your back. Take over-the-counter and prescription medicines only as told by your health care provider, and apply heat or ice as told. This information is not intended to replace advice given to you by your health care provider. Make sure you discuss any questions you have with your health care provider. Document Revised: 12/28/2020 Document Reviewed: 12/28/2020 Elsevier Patient Education  2023 Elsevier Inc.  

## 2022-08-13 ENCOUNTER — Ambulatory Visit (INDEPENDENT_AMBULATORY_CARE_PROVIDER_SITE_OTHER): Payer: Medicare Other | Admitting: Internal Medicine

## 2022-08-13 ENCOUNTER — Encounter: Payer: Self-pay | Admitting: Internal Medicine

## 2022-08-13 ENCOUNTER — Encounter: Payer: Self-pay | Admitting: Emergency Medicine

## 2022-08-13 VITALS — BP 130/70 | HR 70 | Ht 61.0 in | Wt 257.0 lb

## 2022-08-13 DIAGNOSIS — E1142 Type 2 diabetes mellitus with diabetic polyneuropathy: Secondary | ICD-10-CM | POA: Diagnosis not present

## 2022-08-13 DIAGNOSIS — E1165 Type 2 diabetes mellitus with hyperglycemia: Secondary | ICD-10-CM

## 2022-08-13 DIAGNOSIS — D3501 Benign neoplasm of right adrenal gland: Secondary | ICD-10-CM

## 2022-08-13 DIAGNOSIS — E2609 Other primary hyperaldosteronism: Secondary | ICD-10-CM

## 2022-08-13 DIAGNOSIS — Z794 Long term (current) use of insulin: Secondary | ICD-10-CM | POA: Diagnosis not present

## 2022-08-13 DIAGNOSIS — M25551 Pain in right hip: Secondary | ICD-10-CM | POA: Insufficient documentation

## 2022-08-13 LAB — POCT GLYCOSYLATED HEMOGLOBIN (HGB A1C): Hemoglobin A1C: 8.8 % — AB (ref 4.0–5.6)

## 2022-08-13 MED ORDER — OZEMPIC (2 MG/DOSE) 8 MG/3ML ~~LOC~~ SOPN: 2.0000 mg | PEN_INJECTOR | SUBCUTANEOUS | 3 refills | Status: DC

## 2022-08-13 MED ORDER — INSULIN LISPRO PROT & LISPRO (75-25 MIX) 100 UNIT/ML KWIKPEN: 20.0000 [IU] | PEN_INJECTOR | Freq: Two times a day (BID) | SUBCUTANEOUS | 4 refills | Status: DC

## 2022-08-13 NOTE — Assessment & Plan Note (Signed)
Much improved today. Most likely related to osteoarthritis of right hip and degenerative changes. May take Norco for pain as needed in the future. Clinically stable without red flag signs or symptoms.

## 2022-08-13 NOTE — Progress Notes (Signed)
Name: [name]  Age/ Sex: 81 y.o., female   MRN/ DOB: [id number], [birth year]     PCP: Horald Pollen, MD   Reason for Endocrinology Evaluation: Type 2 Diabetes Mellitus  Initial Endocrine Consultative Visit: 06/21/2020    PATIENT IDENTIFIER: [name] is a 81 y.o. female with a past medical history of HTN, PAF, CHF, OSA and Dyslipidemia . The patient has followed with Endocrinology clinic since 06/21/2020 for consultative assistance with management of her diabetes.  DIABETIC HISTORY:  [name] was diagnosed with DM in 2011,she is intolerant to metformin due to diarrhea. Her hemoglobin A1c has ranged from 7.3% in 2011, peaking at 9.5% in 2014.   ADRENAL HISTORY: She was noted with an incidental finding of right adrenal adenoma ~ 4 cm on CT imaging and 09/2010 during evaluation of abdominal pain.  This has been stable on CT imaging of abdomen 03/2020  She had normal saliva cortisol 05/2021  She had normal normetanephrine and metanephrines 01/2022   Patient has primary hyperaldosteronism with an elevated Aldo/PRA ratio of 63.6, low renin activity at 0.11 (in the setting of ARB) and a normal level aldosterone of 7.  Would not proceed with invasive testing due to advanced age and cardiovascular history We have opted to treat medically with spironolactone which was started in May 2023  SUBJECTIVE:   During the last visit (03/06/2022): A1c 7.3 %     Today (08/13/2022): [name] is here for follow-up on diabetes management.  She is accompanied by her best friend.  She checks her blood sugars multiple times a day  times daily, through CGM . The patient has  had hypoglycemic episodes since the last clinic visit, she self reduced     She had a follow-up with pulmonology on 06/16/2022 for groundglass thickening She was seen by podiatry 04/16/2022  She is S/P cardioversion 10/18/2021 for A.Fib   She has been stressed lately due to son needing liver transplant     HOME ENDOCRINE REGIMEN:  Humalog Mix 17 units with Breakfast and Supper Ozempic 1 mg weekly  Spironolactone 25 mg daily        Statin: yes ACE-I/ARB: yes   CONTINUOUS GLUCOSE MONITORING RECORD INTERPRETATION    Dates of Recording: 10/12-10/25/2023  Sensor description:freestyle libre  Results statistics:   CGM use % of time 97  Average and SD 212/16.1  Time in range  16 %  % Time Above 180 70  % Time above 250 14  % Time Below target 0     Glycemic patterns summary: BG's high during the day and night   Hyperglycemic episodes  during the day   Hypoglycemic episodes occurred N/A  Overnight periods: high           DIABETIC COMPLICATIONS: Microvascular complications:  Neuropathy Denies: CKD, , retinopathy Last eye exam: Completed 2022   Macrovascular complications:    Denies: CAD, PVD, CVA    HISTORY:  Past Medical History:  Past Medical History:  Diagnosis Date   Arthritis    Back pain    Chronic anticoagulation    due to aflutter   Chronic kidney disease    Diabetes mellitus    Diastolic CHF, chronic ([place])    a.  echo 2006 - ef 55-65%; mild diast dysfxn;    b. Echo 08/2011: Mild LVH, EF 60%;  c. 04/2013 Echo: EF 65-69%, mild conc LVH;  08/2014 Echo: EF 60-65%, mild-mod MR.   Gout    Hyperlipidemia    Hypertension  a.  Renal arterial Dopplers 12/2011: 1-59% right renal artery stenosis   Morbid obesity ([place])    Obstructive sleep apnea on CPAP    Paroxysmal Afib/Flutter    a. 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   Past Surgical History:  Past Surgical History:  Procedure Laterality Date   APPENDECTOMY     ATRIAL FLUTTER ABLATION N/A 09/24/2011   Procedure: ATRIAL FLUTTER ABLATION;  Surgeon: [name], MD;  [facility] C[facility] LAB;  Service: Cardiovascular;  Laterality: N/A;   CARDIOVERSION  10/22/2011   Procedure: CARDIOVERSION;  Surgeon: [name], MD;  Location: [place];  Service: Cardiovascular;  Laterality: N/A;    CARDIOVERSION N/A 09/10/2011   Procedure: CARDIOVERSION;  Surgeon: [name], MD;  Location: [facility] CATH LAB;  Service: Cardiovascular;  Laterality: N/A;   CHOLECYSTECTOMY     COLONOSCOPY WITH PROPOFOL N/A 06/13/2021   Procedure: COLONOSCOPY WITH PROPOFOL;  Surgeon: Milus Banister, MD;  Location: [facility] ENDOSCOPY;  Service: Endoscopy;  Laterality: N/A;   POLYPECTOMY  06/13/2021   Procedure: POLYPECTOMY;  Surgeon: Milus Banister, MD;  Location: [facility] ENDOSCOPY;  Service: Endoscopy;;   TONSILLECTOMY  1982   TOTAL ABDOMINAL HYSTERECTOMY     Social History:  reports that she has never smoked. She has never used smokeless tobacco. She reports that she does not drink alcohol and does not use drugs. Family History:  Family History  Problem Relation Age of Onset   Heart disease Father    Hypertension Father    Breast cancer Sister    Cancer Sister        breast   Colon cancer Neg Hx    Esophageal cancer Neg Hx    Pancreatic cancer Neg Hx    Liver disease Neg Hx      HOME MEDICATIONS: Allergies as of 08/13/2022       Reactions   Doxycycline    Made patient feel very ill    Metformin Diarrhea        Medication List        Accurate as of August 13, 2022 10:47 AM. If you have any questions, ask your nurse or doctor.          Accu-Chek Softclix Lancets lancets 1 each by Other route 3 (three) times daily. as directed   allopurinol 300 MG tablet Commonly known as: ZYLOPRIM TAKE 1 TABLET(300 MG) BY MOUTH DAILY   apixaban 5 MG Tabs tablet Commonly known as: Eliquis Take 1 tablet (5 mg total) by mouth 2 (two) times daily.   azelastine 0.1 % nasal spray Commonly known as: ASTELIN 1-2 puffs each nostril at bedtime as needed   b complex vitamins capsule Take 1 capsule by mouth daily.   BD Pen Needle Nano 2nd Gen 32G X 4 MM Misc Generic drug: Insulin Pen Needle USE TWICE DAILY AS NEEDED   blood glucose meter kit and supplies Dispense based on patient and insurance  preference. Use up to four times daily as directed. (FOR ICD-10 E10.9, E11.9).   Breztri Aerosphere 160-9-4.8 MCG/ACT Aero Generic drug: Budeson-Glycopyrrol-Formoterol USE 2 INHALATIONS BY MOUTH IN  THE MORNING AND AT BEDTIME   cholecalciferol 25 MCG (1000 UNIT) tablet Commonly known as: VITAMIN D3 Take 1,000 Units by mouth daily.   clobetasol cream 0.05 % Commonly known as: TEMOVATE Apply 1 Application topically 2 (two) times daily.   cloNIDine 0.3 MG tablet Commonly known as: CATAPRES TAKE 1 TABLET BY MOUTH  TWICE DAILY   cyanocobalamin 1000 MCG tablet  Commonly known as: VITAMIN B12 Take 1,000 mcg by mouth daily.   dronedarone 400 MG tablet Commonly known as: MULTAQ Take 1 tablet (400 mg total) by mouth 2 (two) times daily.   FreeStyle Libre 14 Day Reader [name] Use as directed.   FreeStyle Libre 2 Sensor Misc USE AS DIRECTED TO CHECK BLOOD  SUGAR DAILY   Gvoke HypoPen 2-Pack 0.5 MG/0.1ML Soaj Generic drug: Glucagon Inject 0.5 mg into the skin daily as needed. For Hypoglycemic events   HYDROcodone-acetaminophen 5-325 MG tablet Commonly known as: Norco Take 1 tablet by mouth every 6 (six) hours as needed for moderate pain.   hydrOXYzine 10 MG tablet Commonly known as: ATARAX Take 1 tablet (10 mg total) by mouth every 6 (six) hours as needed.   Insulin Lispro Prot & Lispro (75-25) 100 UNIT/ML Kwikpen Commonly known as: HumaLOG Mix 75/25 KwikPen Inject 25 Units into the skin 2 (two) times daily. What changed: how much to take   ipratropium 17 MCG/ACT inhaler Commonly known as: ATROVENT HFA Inhale 2 puffs into the lungs every 6 (six) hours as needed for wheezing.   ketorolac 0.5 % ophthalmic solution Commonly known as: ACULAR SMARTSIG:In Eye(s)   levothyroxine 100 MCG tablet Commonly known as: SYNTHROID Take 1 tablet (100 mcg total) by mouth daily.   lidocaine 5 % Commonly known as: Lidoderm Place 1 patch onto the skin daily. Remove & Discard patch within 12  hours or as directed by MD   metoprolol tartrate 25 MG tablet Commonly known as: LOPRESSOR Take 1 tablet (25 mg total) by mouth 2 (two) times daily.   nystatin powder Commonly known as: nystatin Apply 1 Application topically 2 (two) times daily.   OneTouch Verio test strip Generic drug: glucose blood 1 each by Other route 3 (three) times daily.   Ozempic (2 MG/DOSE) 8 MG/3ML Sopn Generic drug: Semaglutide (2 MG/DOSE) Inject 2 mg into the skin once a week.   Restasis MultiDose 0.05 % ophthalmic emulsion Generic drug: cycloSPORINE Place 1 drop into both eyes 2 (two) times daily.   rosuvastatin 10 MG tablet Commonly known as: CRESTOR TAKE 1 TABLET BY MOUTH IN  THE EVENING   spironolactone 25 MG tablet Commonly known as: ALDACTONE TAKE 1 TABLET BY MOUTH DAILY   TobraDex ophthalmic ointment Generic drug: tobramycin-dexamethasone SMARTSIG:Sparingly In Eye(s) Every Night   torsemide 20 MG tablet Commonly known as: DEMADEX Take 1 tablet (20 mg total) by mouth daily.   triamcinolone cream 0.1 % Commonly known as: KENALOG Apply 1 application topically 2 (two) times daily.         OBJECTIVE:   Vital Signs: BP 130/70 (BP Location: Left Arm, Patient Position: Sitting, Cuff Size: Large)   Pulse 70   Ht _0  (1.549 m)   Wt 257 lb (116.6 kg)   BMI 48.56 kg/m    Wt Readings from Last 3 Encounters:  08/13/22 257 lb (116.6 kg)  08/12/22 257 lb (116.6 kg)  07/09/22 256 lb (116.1 kg)     Exam: General: Pt appears well and is in NAD, in a wheelchair  Lungs: Clear with good BS bilat   Heart: RRR  Extremities: Trace  pretibial edema.   Neuro: MS is good with appropriate affect, pt is alert and Ox3    DM foot exam: 02/07/2022     The skin of the feet is intact without sores or ulcerations. The pedal pulses are 2+ on right and 2+ on left. The sensation is intact to a screening 5.07,  10 gram monofilament bilaterally         DATA REVIEWED:  Lab Results   Component Value Date   HGBA1C 8.2 (A) 05/13/2022   HGBA1C 7.3 (A) 02/07/2022   HGBA1C 8.3 11/14/2021    Latest Reference Range & Units 05/29/22 11:19  COMPREHENSIVE METABOLIC PANEL  Rpt !  Sodium 135 - 145 mEq/L 133 (L)  Potassium 3.5 - 5.1 mEq/L 4.5  Chloride 96 - 112 mEq/L 95 (L)  CO2 19 - 32 mEq/L 31  Glucose 70 - 99 mg/dL 247 (H)  BUN 6 - 23 mg/dL 41 (H)  Creatinine 0.40 - 1.20 mg/dL 1.16  Calcium 8.4 - 10.5 mg/dL 9.1  Alkaline Phosphatase 39 - 117 U/L 110  Albumin 3.5 - 5.2 g/dL 3.8  AST 0 - 37 U/L 15  ALT 0 - 35 U/L 18  Total Protein 6.0 - 8.3 g/dL 7.5  Total Bilirubin 0.2 - 1.2 mg/dL 0.5  GFR >60.00 mL/min 45.12 (L)    ASSESSMENT / PLAN / RECOMMENDATIONS:   1) Type 2 Diabetes Mellitus, Poorly  controlled, With Neuropathic  complications - Most recent A1c of 8.8%. Goal A1c < 7.5 %.    -Patient with worsening glycemic control that she attributes to stress as her son is awaiting liver transplant -She assures me compliance with her insulin as well as her Ozempic -Patient denies any dietary indiscretions - Intolerant to Metformin  -She is on maximum dose of Ozempic -I will increase her insulin as below -Patient advised not to hold insulin if her Premeal BG is optimal  MEDICATIONS:  -He is Humalog Mix 20 units with Breakfast and 20 units with Supper  -Continue Ozempic 2 mg weekly    EDUCATION / INSTRUCTIONS: BG monitoring instructions: Patient is instructed to check her blood sugars 3 times a day, before meals . Call [facility] if: BG persistently < 70 I reviewed the Rule of 15 for the treatment of hypoglycemia in detail with the patient. Literature supplied.    2) Diabetic complications:  Eye: Does not have known diabetic retinopathy.  Neuro/ Feet: Does have known diabetic peripheral neuropathy .  Renal: Patient does not have known baseline CKD. She   is on an ACEI/ARB at present.   3) Right Adrenal Adenoma:   -She was diagnosed with  right adrenal adenoma~4 cm on CT imaging from 2011, this has remained stable based on CT scan 01/01/2022 at 4.3 cm  -She had normal saliva cortisol testing 05/2021, metanephrines and normetanephrine's -She did have suppressed renin in the setting of aldosterone >5 at 7 and elevated Aldo: Renin ratio    4) Primary Hyperaldosteronism:   -We have opted not to proceed with adrenal venous sampling no surgical intervention given advanced age and CHF and other cardiovascular history -Patient is  treated medically with spironolactone -She is off potassium  -She continues to be on torsemide through cardiology -I will check renin, and see if we need to increase spironolactone  Medication Continue spironolactone 25 mg daily     F/U in 6 months    Signed electronically by: Mack Guise, MD  [facility]  [address] [place], [place] [address] Phone: [phone] FAX: [phone]   CC: Horald Pollen, MD [place] [address] Phone: [phone]  Fax: [phone]  Return to Endocrinology clinic as below: Future Appointments  Date Time Provider [place]  09/02/2022 10:00 AM Gardiner Barefoot, DPM [facility]  09/26/2022  3:00  PM [name], MD [facility]

## 2022-08-13 NOTE — Patient Instructions (Signed)
-   Continue  Humalog Mix 20 units with Breakfast and 20 units with Supper  - Continue Ozempic  2 mg weekly      HOW TO TREAT LOW BLOOD SUGARS (Blood sugar LESS THAN 70 MG/DL) Please follow the RULE OF 15 for the treatment of hypoglycemia treatment (when your (blood sugars are less than 70 mg/dL)   STEP 1: Take 15 grams of carbohydrates when your blood sugar is low, which includes:  3-4 GLUCOSE TABS  OR 3-4 OZ OF JUICE OR REGULAR SODA OR ONE TUBE OF GLUCOSE GEL    STEP 2: RECHECK blood sugar in 15 MINUTES STEP 3: If your blood sugar is still low at the 15 minute recheck --> then, go back to STEP 1 and treat AGAIN with another 15 grams of carbohydrates.

## 2022-08-13 NOTE — Progress Notes (Signed)
[name] 81 y.o.   Chief Complaint  Patient presents with   possible pinch nerve right hip    Patient complaining of pain right hip x 1 week     HISTORY OF PRESENT ILLNESS: This is a 81 y.o. female had episode of severe pain to her right hip with some radiation to leg last weekend. Much better today.  No other associated symptoms No other complaints or medical concerns today.  HPI   Prior to Admission medications   Medication Sig Start Date End Date Taking? Authorizing Provider  Accu-Chek Softclix Lancets lancets 1 each by Other route 3 (three) times daily. as directed 03/30/20  Yes Maximiano Coss, NP  allopurinol (ZYLOPRIM) 300 MG tablet TAKE 1 TABLET(300 MG) BY MOUTH DAILY 10/18/21  Yes Jacinto Keil, Ines Bloomer, MD  apixaban (ELIQUIS) 5 MG TABS tablet Take 1 tablet (5 mg total) by mouth 2 (two) times daily. 03/31/22  Yes [name], MD  azelastine (ASTELIN) 0.1 % nasal spray 1-2 puffs each nostril at bedtime as needed 01/02/22  Yes Young, Tarri Fuller D, MD  b complex vitamins capsule Take 1 capsule by mouth daily.   Yes [provider]  BD PEN NEEDLE NANO 2ND GEN 32G X 4 MM MISC USE TWICE DAILY AS NEEDED 08/02/22  Yes Katurah Karapetian, Ines Bloomer, MD  blood glucose meter kit and supplies Dispense based on patient and insurance preference. Use up to four times daily as directed. (FOR ICD-10 E10.9, E11.9). 08/29/21  Yes Kalsey Lull, Ines Bloomer, MD  BREZTRI AEROSPHERE 160-9-4.8 MCG/ACT AERO USE 2 INHALATIONS BY MOUTH IN  THE MORNING AND AT BEDTIME 11/12/21  Yes Cobb, Karie Schwalbe, NP  cholecalciferol (VITAMIN D3) 25 MCG (1000 UNIT) tablet Take 1,000 Units by mouth daily.   Yes [provider]  clobetasol cream (TEMOVATE) 9.24 % Apply 1 Application topically 2 (two) times daily. 06/05/22  Yes Ezmeralda Stefanick, Ines Bloomer, MD  cloNIDine (CATAPRES) 0.3 MG tablet TAKE 1 TABLET BY MOUTH  TWICE DAILY 01/28/22  Yes Darienne Belleau, Ines Bloomer, MD  Continuous Blood Gluc Receiver (FREESTYLE LIBRE 14  DAY READER) DEVI Use as directed. 08/29/21  Yes Ciena Sampley, Ines Bloomer, MD  Continuous Blood Gluc Sensor (FREESTYLE LIBRE 2 SENSOR) MISC USE AS DIRECTED TO CHECK BLOOD  SUGAR DAILY 04/06/22  Yes Horald Pollen, MD  dronedarone (MULTAQ) 400 MG tablet Take 1 tablet (400 mg total) by mouth 2 (two) times daily. 12/19/21  Yes [name], MD  Glucagon (GVOKE HYPOPEN 2-PACK) 0.5 MG/0.1ML SOAJ Inject 0.5 mg into the skin daily as needed. For Hypoglycemic events 05/30/22  Yes Herold Salguero, Ines Bloomer, MD  HYDROcodone-acetaminophen [facility]) 5-325 MG tablet Take 1 tablet by mouth every 6 (six) hours as needed for moderate pain. Patient not taking: Reported on 08/13/2022 08/12/22  Yes Tanis Burnley, Ines Bloomer, MD  hydrOXYzine (ATARAX) 10 MG tablet Take 1 tablet (10 mg total) by mouth every 6 (six) hours as needed. 08/12/22  Yes Horald Pollen, MD  ipratropium (ATROVENT HFA) 17 MCG/ACT inhaler Inhale 2 puffs into the lungs every 6 (six) hours as needed for wheezing. 12/17/21 12/17/22 Yes Baird Lyons D, MD  ketorolac (ACULAR) 0.5 % ophthalmic solution SMARTSIG:In Eye(s) 08/22/21  Yes [provider]  levothyroxine (SYNTHROID) 100 MCG tablet Take 1 tablet (100 mcg total) by mouth daily. 03/18/22  Yes Lovelle Lema, Ines Bloomer, MD  lidocaine (LIDODERM) 5 % Place 1 patch onto the skin daily. Remove & Discard patch within 12 hours or as directed by MD 08/29/20  Yes Ninfa Linden,  Lind Guest, MD  nystatin powder Apply 1 Application topically 2 (two) times daily. 08/12/22  Yes Azelea Seguin, Ines Bloomer, MD  [facility] VERIO test strip 1 each by Other route 3 (three) times daily. 06/08/20  Yes [provider]  RESTASIS MULTIDOSE 0.05 % ophthalmic emulsion Place 1 drop into both eyes 2 (two) times daily.  03/04/20  Yes [provider]  rosuvastatin (CRESTOR) 10 MG tablet TAKE 1 TABLET BY MOUTH IN  THE EVENING 04/03/22  Yes Ivory Maduro, Ines Bloomer, MD  spironolactone (ALDACTONE) 25 MG tablet TAKE 1 TABLET BY  MOUTH DAILY 04/28/22  Yes [name], Melanie Crazier, MD  TOBRADEX ophthalmic ointment SMARTSIG:Sparingly In Eye(s) Every Night 08/25/21  Yes [provider]  torsemide (DEMADEX) 20 MG tablet Take 1 tablet (20 mg total) by mouth daily. 05/30/21  Yes Sherran Needs, NP  triamcinolone cream (KENALOG) 0.1 % Apply 1 application topically 2 (two) times daily. 07/26/20  Yes Admire Bunnell, Ines Bloomer, MD  vitamin B-12 (CYANOCOBALAMIN) 1000 MCG tablet Take 1,000 mcg by mouth daily.   Yes [provider]  Insulin Lispro Prot & Lispro (HUMALOG MIX 75/25 KWIKPEN) (75-25) 100 UNIT/ML Kwikpen Inject 20 Units into the skin 2 (two) times daily. 08/13/22   [name], Melanie Crazier, MD  metoprolol tartrate (LOPRESSOR) 25 MG tablet Take 1 tablet (25 mg total) by mouth 2 (two) times daily. 12/11/21 03/11/22  Horald Pollen, MD  Semaglutide, 2 MG/DOSE, (OZEMPIC, 2 MG/DOSE,) 8 MG/3ML SOPN Inject 2 mg into the skin once a week. 08/13/22   [name], Melanie Crazier, MD  potassium chloride (KLOR-CON) 10 MEQ tablet Take 4 tablets (40 mEq total) by mouth daily. 12/10/21 03/06/22  Shirley Friar, PA-C    Allergies  Allergen Reactions   Doxycycline     Made patient feel very ill    Metformin Diarrhea    Patient Active Problem List   Diagnosis Date Noted   Hematoma of right knee region 07/09/2022   Primary hyperaldosteronism ([place]) 03/06/2022   Coronary artery disease involving native coronary artery of native heart without angina pectoris 02/12/2022   Adrenal adenoma, right 02/07/2022   Skin lesion of right arm 02/02/2022   Asthma 11/01/2021   Normocytic anemia 10/11/2021   Nodule of right lung 09/30/2021   Dysthymia 03/12/2021   Primary osteoarthritis involving multiple joints 03/01/2021   Recurrent falls 01/10/2021   Sensorineural hearing loss (SNHL), 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   Persistent atrial fibrillation ([place]) 08/12/2020   Type 2 diabetes mellitus with hyperglycemia, with  long-term current use of insulin ([place]) 06/21/2020   Type 2 diabetes mellitus with diabetic polyneuropathy, with long-term current use of insulin ([place]) 06/21/2020   Mixed hyperlipidemia 06/21/2020   Uncontrolled hypertension 06/11/2020   Aortic atherosclerosis ([place]) 03/28/2020   Diabetic peripheral neuropathy ([place]) 12/26/2019   Gastroesophageal reflux disease without esophagitis 03/17/2019   Rash and nonspecific skin eruption 03/17/2019   Bilateral leg edema 02/16/2019   Pruritus 02/16/2019   Varicose veins of both legs with edema 02/16/2019   Heart murmur 09/05/2018   Restrictive lung disease 02/22/2018   Dysgeusia 06/11/2017   Osteoarthritis of left knee 06/03/2017   NAFLD (nonalcoholic fatty liver disease) 08/07/2016   Osteoporosis 12/31/2015   Obesity 12/19/2015   Edema 10/18/2015   Acquired hypothyroidism 10/18/2015   Prolonged QT interval 10/03/2013   CHF (congestive heart failure) ([place]) 04/21/2013   Long term (current) use of anticoagulants 06/09/2012   BENIGN NEOPLASM OF ADRENAL GLAND 11/25/2010   Chronic diastolic heart failure ([place]) 02/20/2010   DM  05/16/2009   GOUT 05/16/2009   OBESITY, MORBID 05/16/2009   Hypertension associated with diabetes ([place]) 05/16/2009   Paroxysmal atrial fibrillation ([place]) 05/16/2009   HYPERLIPIDEMIA 11/30/2008   Obstructive sleep apnea on CPAP 11/30/2008    Past Medical History:  Diagnosis Date   Arthritis    Back pain    Chronic anticoagulation    due to aflutter   Chronic kidney disease    Diabetes mellitus    Diastolic CHF, chronic ([place])    a.  echo 2006 - ef 55-65%; mild diast dysfxn;    b. Echo 08/2011: Mild LVH, EF 60%;  c. 04/2013 Echo: EF 65-69%, mild conc LVH;  08/2014 Echo: EF 60-65%, mild-mod MR.   Gout    Hyperlipidemia    Hypertension    a.  Renal arterial Dopplers 12/2011: 1-59% right renal artery stenosis   Morbid obesity ([place])    Obstructive sleep apnea on CPAP    Paroxysmal Afib/Flutter    a. dccv: 08/2011 - on  amiodarone/coumadin    Past Surgical History:  Procedure Laterality Date   APPENDECTOMY     ATRIAL FLUTTER ABLATION N/A 09/24/2011   Procedure: ATRIAL FLUTTER ABLATION;  Surgeon: [name], MD;  Location[facility] CATH LAB;  Service: Cardiovascular;  Laterality: N/A;   CARDIOVERSION  10/22/2011   Procedure: CARDIOVERSION;  Surgeon: [name], MD;  Location: [place];  Service: Cardiovascular;  Laterality: N/A;   CARDIOVERSION N/A 09/10/2011   Procedure: CARDIOVERSION;  Surgeon: [name], MD;  Location: [facility] CATH LAB;  Service: Cardiovascular;  Laterality: N/A;   CHOLECYSTECTOMY     COLONOSCOPY WITH PROPOFOL N/A 06/13/2021   Procedure: COLONOSCOPY WITH PROPOFOL;  Surgeon: Milus Banister, MD;  Location: [facility] ENDOSCOPY;  Service: Endoscopy;  Laterality: N/A;   POLYPECTOMY  06/13/2021   Procedure: POLYPECTOMY;  Surgeon: Milus Banister, MD;  Location: [facility] ENDOSCOPY;  Service: Endoscopy;;   TONSILLECTOMY  1982   TOTAL ABDOMINAL HYSTERECTOMY      Social History   Socioeconomic History   Marital status: Married    Spouse name: Not on file   Number of children: 3   Years of education: Not on file   Highest education level: Not on file  Occupational History   Occupation: DISABILITY/housewife    Employer: RETIRED  Tobacco Use   Smoking status: Never   Smokeless tobacco: Never  Vaping Use   Vaping Use: Never used  Substance and Sexual Activity   Alcohol use: No   Drug use: No   Sexual activity: Yes  Other Topics Concern   Not on file  Social History Narrative   Not on file   Social Determinants of Health   Financial Resource Strain: Low Risk  (11/08/2021)   Overall Financial Resource Strain (CARDIA)    Difficulty of Paying Living Expenses: Not hard at all  Food Insecurity: No Food Insecurity (06/02/2022)   Hunger Vital Sign    Worried About Running Out of Food in the Last Year: Never true    Ran Out of Food in the Last Year: Never true  Transportation Needs: No  Transportation Needs (06/02/2022)   PRAPARE - Hydrologist (Medical): No    Lack of Transportation (Non-Medical): No  Physical Activity: Inactive (11/08/2021)   Exercise Vital Sign    Days of Exercise per Week: 0 days    Minutes of Exercise per Session: 0 min  Stress: No Stress Concern Present (11/08/2021)   [employer]  [employer] - Occupational Stress Questionnaire    Feeling of Stress : Not at all  Social Connections: Socially Integrated (11/08/2021)   Social Connection and Isolation Panel [NHANES]    Frequency of Communication with Friends and Family: More than three times a week    Frequency of Social Gatherings with Friends and Family: Never    Attends Religious Services: More than 4 times per year    Active Member of [employer] or Organizations: Yes    Attends Music therapist: More than 4 times per year    Marital Status: Married  Human resources officer Violence: Not At Risk (11/08/2021)   Humiliation, Afraid, Rape, and Kick questionnaire    Fear of Current or Ex-Partner: No    Emotionally Abused: No    Physically Abused: No    Sexually Abused: No    Family History  Problem Relation Age of Onset   Heart disease Father    Hypertension Father    Breast cancer Sister    Cancer Sister        breast   Colon cancer Neg Hx    Esophageal cancer Neg Hx    Pancreatic cancer Neg Hx    Liver disease Neg Hx      Review of Systems  Constitutional: Negative.  Negative for chills and fever.  HENT: Negative.  Negative for congestion and sore throat.   Respiratory: Negative.  Negative for cough and shortness of breath.   Cardiovascular: Negative.  Negative for chest pain and palpitations.  Gastrointestinal:  Negative for abdominal pain, nausea and vomiting.  Genitourinary: Negative.  Negative for dysuria and hematuria.  Musculoskeletal:  Positive for back pain and joint pain.  Skin: Negative.  Negative for rash.  Neurological:   Negative for dizziness and headaches.  All other systems reviewed and are negative.  Today's Vitals   08/12/22 1324  BP: 124/72  Pulse: 72  Temp: 98.2 F (36.8 C)  TempSrc: Oral  Weight: 257 lb (116.6 kg)  Height: _0  (1.549 m)   Body mass index is 48.56 kg/m.   Physical Exam Constitutional:      Appearance: Normal appearance.  HENT:     Head: Normocephalic.  Eyes:     Extraocular Movements: Extraocular movements intact.     Pupils: Pupils are equal, round, and reactive to light.  Cardiovascular:     Rate and Rhythm: Normal rate and regular rhythm.     Pulses: Normal pulses.     Heart sounds: Normal heart sounds.  Pulmonary:     Effort: Pulmonary effort is normal.     Breath sounds: Normal breath sounds.  Musculoskeletal:     Cervical back: No tenderness.  Lymphadenopathy:     Cervical: No cervical adenopathy.  Skin:    General: Skin is warm and dry.  Neurological:     General: No focal deficit present.     Mental Status: She is alert and oriented to person, place, and time.  Psychiatric:        Mood and Affect: Mood normal.        Behavior: Behavior normal.      ASSESSMENT & PLAN: Problem List Items Addressed This Visit       Other   Musculoskeletal back pain - Primary    Much improved today. May take Tylenol for mild to moderate pain. Norco for severe pain.      Relevant Medications   HYDROcodone-acetaminophen (NORCO) 5-325 MG tablet   Long term (current) use of anticoagulants  Advised to avoid NSAIDs.      Right hip pain    Much improved today. Most likely related to osteoarthritis of right hip and degenerative changes. May take Norco for pain as needed in the future. Clinically stable without red flag signs or symptoms.      Patient Instructions  Acute Back Pain, Adult Acute back pain is sudden and usually short-lived. It is often caused by an injury to the muscles and tissues in the back. The injury may result from: A muscle,  tendon, or ligament getting overstretched or torn. Ligaments are tissues that connect bones to each other. Lifting something improperly can cause a back strain. Wear and tear (degeneration) of the spinal disks. Spinal disks are circular tissue that provide cushioning between the bones of the spine (vertebrae). Twisting motions, such as while playing sports or doing yard work. A hit to the back. Arthritis. You may have a physical exam, lab tests, and imaging tests to find the cause of your pain. Acute back pain usually goes away with rest and home care. Follow these instructions at home: Managing pain, stiffness, and swelling Take over-the-counter and prescription medicines only as told by your health care provider. Treatment may include medicines for pain and inflammation that are taken by mouth or applied to the skin, or muscle relaxants. Your health care provider may recommend applying ice during the first 24-48 hours after your pain starts. To do this: Put ice in a plastic bag. Place a towel between your skin and the bag. Leave the ice on for 20 minutes, 2-3 times a day. Remove the ice if your skin turns bright red. This is very important. If you cannot feel pain, heat, or cold, you have a greater risk of damage to the area. If directed, apply heat to the affected area as often as told by your health care provider. Use the heat source that your health care provider recommends, such as a moist heat pack or a heating pad. Place a towel between your skin and the heat source. Leave the heat on for 20-30 minutes. Remove the heat if your skin turns bright red. This is especially important if you are unable to feel pain, heat, or cold. You have a greater risk of getting burned. Activity  Do not stay in bed. Staying in bed for more than 1-2 days can delay your recovery. Sit up and stand up straight. Avoid leaning forward when you sit or hunching over when you stand. If you work at a desk, sit close  to it so you do not need to lean over. Keep your chin tucked in. Keep your neck drawn back, and keep your elbows bent at a 90-degree angle (right angle). Sit high and close to the steering wheel when you drive. Add lower back (lumbar) support to your car seat, if needed. Take short walks on even surfaces as soon as you are able. Try to increase the length of time you walk each day. Do not sit, drive, or stand in one place for more than 30 minutes at a time. Sitting or standing for long periods of time can put stress on your back. Do not drive or use heavy machinery while taking prescription pain medicine. Use proper lifting techniques. When you bend and lift, use positions that put less stress on your back: [place] your knees. Keep the load close to your body. Avoid twisting. Exercise regularly as told by your health care provider. Exercising helps your back heal faster and  helps prevent back injuries by keeping muscles strong and flexible. Work with a physical therapist to make a safe exercise program, as recommended by your health care provider. Do any exercises as told by your physical therapist. Lifestyle Maintain a healthy weight. Extra weight puts stress on your back and makes it difficult to have good posture. Avoid activities or situations that make you feel anxious or stressed. Stress and anxiety increase muscle tension and can make back pain worse. Learn ways to manage anxiety and stress, such as through exercise. General instructions Sleep on a firm mattress in a comfortable position. Try lying on your side with your knees slightly bent. If you lie on your back, put a pillow under your knees. Keep your head and neck in a straight line with your spine (neutral position) when using electronic equipment like smartphones or pads. To do this: Raise your smartphone or pad to look at it instead of bending your head or neck to look down. Put the smartphone or pad at the level of your face while  looking at the screen. Follow your treatment plan as told by your health care provider. This may include: Cognitive or behavioral therapy. Acupuncture or massage therapy. Meditation or yoga. Contact a health care provider if: You have pain that is not relieved with rest or medicine. You have increasing pain going down into your legs or buttocks. Your pain does not improve after 2 weeks. You have pain at night. You lose weight without trying. You have a fever or chills. You develop nausea or vomiting. You develop abdominal pain. Get help right away if: You develop new bowel or bladder control problems. You have unusual weakness or numbness in your arms or legs. You feel faint. These symptoms may represent a serious problem that is an emergency. Do not wait to see if the symptoms will go away. Get medical help right away. Call your local emergency services (911 in the [place] Do not drive yourself to the hospital. Summary Acute back pain is sudden and usually short-lived. Use proper lifting techniques. When you bend and lift, use positions that put less stress on your back. Take over-the-counter and prescription medicines only as told by your health care provider, and apply heat or ice as told. This information is not intended to replace advice given to you by your health care provider. Make sure you discuss any questions you have with your health care provider. Document Revised: 12/28/2020 Document Reviewed: 12/28/2020 Elsevier Patient Education  [place], MD [facility]

## 2022-08-13 NOTE — Assessment & Plan Note (Signed)
Advised to avoid NSAIDs.

## 2022-08-13 NOTE — Assessment & Plan Note (Signed)
Much improved today. May take Tylenol for mild to moderate pain. Norco for severe pain.

## 2022-08-14 ENCOUNTER — Encounter: Payer: Self-pay | Admitting: Internal Medicine

## 2022-08-20 LAB — RENIN: Renin Activity: 4.22 ng/mL/h (ref 0.25–5.82)

## 2022-08-26 ENCOUNTER — Telehealth: Payer: Self-pay

## 2022-08-26 NOTE — Telephone Encounter (Signed)
Patient calling for labs result.

## 2022-08-26 NOTE — Telephone Encounter (Signed)
Patient informed and expressed underestanding.

## 2022-09-02 ENCOUNTER — Ambulatory Visit: Payer: Medicare Other | Admitting: Podiatry

## 2022-09-03 ENCOUNTER — Ambulatory Visit (INDEPENDENT_AMBULATORY_CARE_PROVIDER_SITE_OTHER): Payer: Medicare Other | Admitting: Podiatry

## 2022-09-03 DIAGNOSIS — D689 Coagulation defect, unspecified: Secondary | ICD-10-CM

## 2022-09-03 DIAGNOSIS — Z794 Long term (current) use of insulin: Secondary | ICD-10-CM

## 2022-09-03 DIAGNOSIS — B351 Tinea unguium: Secondary | ICD-10-CM

## 2022-09-03 DIAGNOSIS — M2011 Hallux valgus (acquired), right foot: Secondary | ICD-10-CM

## 2022-09-03 DIAGNOSIS — M2041 Other hammer toe(s) (acquired), right foot: Secondary | ICD-10-CM

## 2022-09-03 DIAGNOSIS — M79674 Pain in right toe(s): Secondary | ICD-10-CM

## 2022-09-03 DIAGNOSIS — M79675 Pain in left toe(s): Secondary | ICD-10-CM

## 2022-09-03 DIAGNOSIS — M2042 Other hammer toe(s) (acquired), left foot: Secondary | ICD-10-CM

## 2022-09-03 DIAGNOSIS — E1142 Type 2 diabetes mellitus with diabetic polyneuropathy: Secondary | ICD-10-CM

## 2022-09-03 DIAGNOSIS — M2012 Hallux valgus (acquired), left foot: Secondary | ICD-10-CM

## 2022-09-03 NOTE — Progress Notes (Signed)
This patient returns to my office for at risk foot care.  This patient requires this care by a professional since this patient will be at risk due to having diabetes and coagulation defect.  This patient is unable to cut nails herself since the patient cannot reach her nails.These nails are painful walking and wearing shoes.  This patient presents for at risk foot care today.  General Appearance  Alert, conversant and in no acute stress.  Vascular  Dorsalis pedis and posterior tibial  pulses are palpable  bilaterally.  Capillary return is within normal limits  bilaterally. Temperature is within normal limits  bilaterally.  Neurologic  Senn-Weinstein monofilament wire test within normal limits  bilaterally. Muscle power within normal limits bilaterally.  Nails Thick disfigured discolored nails with subungual debris  from hallux to fifth toes bilaterally. No evidence of bacterial infection or drainage bilaterally.  Orthopedic  No limitations of motion  feet .  No crepitus or effusions noted.  No bony pathology or digital deformities noted.  Skin  normotropic skin with no porokeratosis noted bilaterally.  No signs of infections or ulcers noted.     Onychomycosis  Pain in right toes  Pain in left toes  Consent was obtained for treatment procedures.   Mechanical debridement of nails 1-5  bilaterally performed with a nail nipper.  Filed with dremel without incident. Patient had nail polish on all her toes.  Told her I could not completely do the nail care due to nail polish.    Return office visit   3 months                   Told patient to return for periodic foot care and evaluation due to potential at risk complications.   Gardiner Barefoot DPM

## 2022-09-05 ENCOUNTER — Other Ambulatory Visit: Payer: Self-pay

## 2022-09-05 DIAGNOSIS — I480 Paroxysmal atrial fibrillation: Secondary | ICD-10-CM

## 2022-09-05 MED ORDER — APIXABAN 5 MG PO TABS: 5.0000 mg | ORAL_TABLET | Freq: Two times a day (BID) | ORAL | 1 refills | Status: DC

## 2022-09-05 NOTE — Telephone Encounter (Signed)
Prescription refill request for Eliquis received. Indication:afib Last office visit:5/23 Scr:1.1 Age: 81 Weight:116.6  kg  Prescription refilled

## 2022-09-08 ENCOUNTER — Ambulatory Visit: Payer: Medicare Other | Admitting: Internal Medicine

## 2022-09-12 DIAGNOSIS — G4733 Obstructive sleep apnea (adult) (pediatric): Secondary | ICD-10-CM | POA: Diagnosis not present

## 2022-09-12 DIAGNOSIS — I10 Essential (primary) hypertension: Secondary | ICD-10-CM | POA: Diagnosis not present

## 2022-09-16 DIAGNOSIS — H04129 Dry eye syndrome of unspecified lacrimal gland: Secondary | ICD-10-CM | POA: Diagnosis not present

## 2022-09-16 DIAGNOSIS — H16121 Filamentary keratitis, right eye: Secondary | ICD-10-CM | POA: Diagnosis not present

## 2022-09-16 DIAGNOSIS — H538 Other visual disturbances: Secondary | ICD-10-CM | POA: Diagnosis not present

## 2022-09-26 ENCOUNTER — Encounter: Payer: Self-pay | Admitting: Internal Medicine

## 2022-09-26 ENCOUNTER — Ambulatory Visit: Payer: Medicare Other | Attending: Internal Medicine | Admitting: Internal Medicine

## 2022-09-26 VITALS — BP 114/66 | HR 72 | Ht 61.0 in | Wt 251.0 lb

## 2022-09-26 DIAGNOSIS — I4819 Other persistent atrial fibrillation: Secondary | ICD-10-CM

## 2022-09-26 DIAGNOSIS — I5032 Chronic diastolic (congestive) heart failure: Secondary | ICD-10-CM

## 2022-09-26 NOTE — Progress Notes (Signed)
    Patient Care Team: Sagardia, Miguel Jose, MD as PCP - General (Internal Medicine) ,  C, MD as PCP - Electrophysiology (Cardiology) Shaw, Eva N, MD as Resident (Family Medicine) Szabat, Daniel C, RPH (Inactive) as Pharmacist (Pharmacist) Spencer, Michael, MD as Consulting Physician (Ophthalmology) [facility]   HPI  [name] is a 80 y.o. female with morbid obesity  seen in followup for atrial arrhythmias/fibrillation associated with diastolic heart failure. She's been tried on flecainide, dofetilide  amiodarone.  Seen at [facility] 12/23 amiodarone was discontinued along with losartan and verapamil and started on Dronaderone lisinopril and metoprolol.   For the most part is without cardiovascular complaint.  Back pain and knee pain is largely limiting.  Obesity is obviously an issue.  Does have limited exercise tolerance because of the above.  No chest pain.  No edema.    Had been followed for sleep apnea [place]    DATE TEST EF   11/12 Echo   60-65 %   11/15 Echo   60-65 % MR mild-mod  8/17 Echo  60-65% LAE (47/2.1/38)   8/22 Echo  65-70%         Date Cr K Hgb TSH LFTs PFTs  8/17    2.444    3/18    0.175    4/19  0.93 4.0 13.3   17 106%  6/19 0.75 4.1      8/20 0.85 3.8 12.4 0.95    9/21 0.86 3.3 12.7 2.05 15   11/21 0.78 3.4 12.2   11   8/23 1.16 4.5 11.8 0.84          Thromboembolic risk factors ( age  -2, HTN-1, DM-1, Gender-1) for a CHADSVASc Score of >=5          Past Medical History:  Diagnosis Date   Arthritis    Back pain    Chronic anticoagulation    due to aflutter   Chronic kidney disease    Diabetes mellitus    Diastolic CHF, chronic (HCC)    a.  echo 2006 - ef 55-65%; mild diast dysfxn;    b. Echo 08/2011: Mild LVH, EF 60%;  c. 04/2013 Echo: EF 65-69%, mild conc LVH;  08/2014 Echo: EF 60-65%, mild-mod MR.   Gout    Hyperlipidemia    Hypertension    a.  Renal arterial Dopplers 12/2011: 1-59% right renal  artery stenosis   Morbid obesity (HCC)    Obstructive sleep apnea on CPAP    Paroxysmal Afib/Flutter    a. 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    Past Surgical History:  Procedure Laterality Date   APPENDECTOMY     ATRIAL FLUTTER ABLATION N/A 09/24/2011   Procedure: ATRIAL FLUTTER ABLATION;  Surgeon: Gregg W Taylor, MD;  Location: [facility] CATH [facility];  Service: Cardiovascular;  Laterality: N/A;   CARDIOVERSION  10/22/2011   Procedure: CARDIOVERSION;  Surgeon:  C , MD;  Location: [facility] OR;  Service: Cardiovascular;  Laterality: N/A;   CARDIOVERSION N/A 09/10/2011   Procedure: CARDIOVERSION;  Surgeon:  C , MD;  [facility]: [facility];  Service: Cardiovascular;  Laterality: N/A;   CHOLECYSTECTOMY     COLONOSCOPY WITH PROPOFOL N/A 06/13/2021   Procedure: COLONOSCOPY WITH PROPOFOL;  Surgeon: Jacobs, Daniel P, MD;  Location: [facility] ENDOSCOPY;  Service: Endoscopy;  Laterality: N/A;   POLYPECTOMY  06/13/2021   Procedure: POLYPECTOMY;  Surgeon: Jacobs, Daniel P, MD;  Location: [facility] ENDOSCOPY;  Service: Endoscopy;;     TONSILLECTOMY  1982   TOTAL ABDOMINAL HYSTERECTOMY      Current Outpatient Medications  Medication Sig Dispense Refill   Accu-Chek Softclix Lancets lancets 1 each by Other route 3 (three) times daily. as directed 100 each 3   allopurinol (ZYLOPRIM) 300 MG tablet TAKE 1 TABLET(300 MG) BY MOUTH DAILY 90 tablet 3   apixaban (ELIQUIS) 5 MG TABS tablet Take 1 tablet (5 mg total) by mouth 2 (two) times daily. 180 tablet 1   azelastine (ASTELIN) 0.1 % nasal spray 1-2 puffs each nostril at bedtime as needed 30 mL 12   b complex vitamins capsule Take 1 capsule by mouth daily.     BD PEN NEEDLE NANO 2ND GEN 32G X 4 MM MISC USE TWICE DAILY AS NEEDED 100 each 11   blood glucose meter kit and supplies Dispense based on patient and insurance preference. Use up to four times daily as directed. (FOR ICD-10 E10.9, E11.9). 1 each 0   BREZTRI AEROSPHERE 160-9-4.8 MCG/ACT AERO USE 2 INHALATIONS BY  MOUTH IN  THE MORNING AND AT BEDTIME 21.4 g 5   cholecalciferol (VITAMIN D3) 25 MCG (1000 UNIT) tablet Take 1,000 Units by mouth daily.     clobetasol cream (TEMOVATE) 8.41 % Apply 1 Application topically 2 (two) times daily. 30 g 1   cloNIDine (CATAPRES) 0.3 MG tablet TAKE 1 TABLET BY MOUTH  TWICE DAILY 180 tablet 3   Continuous Blood Gluc Receiver (FREESTYLE LIBRE 14 DAY READER) DEVI Use as directed. 1 each 0   Continuous Blood Gluc Sensor (FREESTYLE LIBRE 2 SENSOR) MISC USE AS DIRECTED TO CHECK BLOOD  SUGAR DAILY 8 each 2   dronedarone (MULTAQ) 400 MG tablet Take 1 tablet (400 mg total) by mouth 2 (two) times daily. 180 tablet 3   Glucagon (GVOKE HYPOPEN 2-PACK) 0.5 MG/0.1ML SOAJ Inject 0.5 mg into the skin daily as needed. For Hypoglycemic events 0.2 mL 5   HYDROcodone-acetaminophen (NORCO) 5-325 MG tablet Take 1 tablet by mouth every 6 (six) hours as needed for moderate pain. 30 tablet 0   hydrOXYzine (ATARAX) 10 MG tablet Take 1 tablet (10 mg total) by mouth every 6 (six) hours as needed. 30 tablet 1   Insulin Lispro Prot & Lispro (HUMALOG MIX 75/25 KWIKPEN) (75-25) 100 UNIT/ML Kwikpen Inject 20 Units into the skin 2 (two) times daily. 45 mL 4   ipratropium (ATROVENT HFA) 17 MCG/ACT inhaler Inhale 2 puffs into the lungs every 6 (six) hours as needed for wheezing. 1 each 2   ketorolac (ACULAR) 0.5 % ophthalmic solution SMARTSIG:In Eye(s)     levothyroxine (SYNTHROID) 100 MCG tablet Take 1 tablet (100 mcg total) by mouth daily. 90 tablet 3   lidocaine (LIDODERM) 5 % Place 1 patch onto the skin daily. Remove & Discard patch within 12 hours or as directed by MD 30 patch 0   losartan (COZAAR) 100 MG tablet Take 100 mg by mouth daily.     metoprolol tartrate (LOPRESSOR) 25 MG tablet Take 1 tablet (25 mg total) by mouth 2 (two) times daily. 180 tablet 3   nystatin powder Apply 1 Application topically 2 (two) times daily. 60 g 7   ONETOUCH VERIO test strip 1 each by Other route 3 (three) times daily.      RESTASIS MULTIDOSE 0.05 % ophthalmic emulsion Place 1 drop into both eyes 2 (two) times daily.  (Patient not taking: Reported on 09/26/2022)     rosuvastatin (CRESTOR) 10 MG tablet TAKE 1 TABLET BY MOUTH IN  THE EVENING (Patient not taking: Reported on 09/26/2022) 90 tablet 3   Semaglutide, 2 MG/DOSE, (OZEMPIC, 2 MG/DOSE,) 8 MG/3ML SOPN Inject 2 mg into the skin once a week. (Patient not taking: Reported on 09/26/2022) 9 mL 3   spironolactone (ALDACTONE) 25 MG tablet TAKE 1 TABLET BY MOUTH DAILY (Patient not taking: Reported on 09/26/2022) 100 tablet 2   TOBRADEX ophthalmic ointment SMARTSIG:Sparingly In Eye(s) Every Night (Patient not taking: Reported on 09/26/2022)     torsemide (DEMADEX) 20 MG tablet Take 1 tablet (20 mg total) by mouth daily. (Patient not taking: Reported on 09/26/2022)     triamcinolone cream (KENALOG) 0.1 % Apply 1 application topically 2 (two) times daily. (Patient not taking: Reported on 09/26/2022) 30 g 0   vitamin B-12 (CYANOCOBALAMIN) 1000 MCG tablet Take 1,000 mcg by mouth daily. (Patient not taking: Reported on 09/26/2022)     No current facility-administered medications for this visit.    Allergies  Allergen Reactions   Doxycycline     Made patient feel very ill    Metformin Diarrhea    Review of Systems negative except from HPI and PMH  Physical Exam   BP 114/66   Pulse 72   Ht 5' 1" (1.549 m)   Wt 251 lb (113.9 kg)   SpO2 99%   BMI 47.43 kg/m  Well developed and Morbidly obese  in no acute distress HENT normal Neck supple with JVP-flat Clear Regular rate and rhythm, no  murmur Abd-soft with active BS No Clubbing cyanosis  edema Skin-warm and dry A & Oriented  Grossly normal sensory and motor function  ECG sinus 63 27/08/44   Assessment and  Plan  Hypertension   Persistent atrial fibrillation   HFpEF  Chronic  Hypokalemia  Rash  Obstructive sleep apnea-treated  Morbidly obese   Blood pressure well-controlled.  Will continue her  on metoprolol 25 Aldactone 25 and her Demadex and clonidine  She is euvolemic.  Continue Demadex.  No interval atrial fibrillation.  Continue anticoagulation with apixaban 5 twice daily. 

## 2022-09-26 NOTE — Patient Instructions (Signed)
Medication Instructions:  Your physician recommends that you continue on your current medications as directed. Please refer to the Current Medication list given to you today.  *If you need a refill on your cardiac medications before your next appointment, please call your pharmacy*   Lab Work: None ordered.  If you have labs (blood work) drawn today and your tests are completely normal, you will receive your results only by: MyChart Message (if you have MyChart) OR A paper copy in the mail If you have any lab test that is abnormal or we need to change your treatment, we will call you to review the results.   Testing/Procedures: None ordered.    Follow-Up: At [facility], you and your health needs are our priority.  As part of our continuing mission to provide you with exceptional heart care, we have created designated Provider Care Teams.  These Care Teams include your primary Cardiologist (physician) and Advanced Practice Providers (APPs -  Physician Assistants and Nurse Practitioners) who all work together to provide you with the care you need, when you need it.  We recommend signing up for the patient portal called "MyChart".  Sign up information is provided on this After Visit Summary.  MyChart is used to connect with patients for Virtual Visits (Telemedicine).  Patients are able to view lab/test results, encounter notes, upcoming appointments, etc.  Non-urgent messages can be sent to your provider as well.   To learn more about what you can do with MyChart, go to [email or web address].    Your next appointment:   12 months with Dr Klein  Important Information About Sugar       

## 2022-09-29 NOTE — Addendum Note (Signed)
Addended by: Michelle Nasuti on: 09/29/2022 04:03 PM   Modules accepted: Orders

## 2022-10-12 ENCOUNTER — Other Ambulatory Visit: Payer: Self-pay | Admitting: Emergency Medicine

## 2022-10-12 DIAGNOSIS — G4733 Obstructive sleep apnea (adult) (pediatric): Secondary | ICD-10-CM | POA: Diagnosis not present

## 2022-10-12 DIAGNOSIS — I10 Essential (primary) hypertension: Secondary | ICD-10-CM | POA: Diagnosis not present

## 2022-10-28 ENCOUNTER — Telehealth: Payer: Self-pay | Admitting: Internal Medicine

## 2022-10-28 NOTE — Telephone Encounter (Signed)
Patient is calling saying that she is having problems with elevated blood sugar levels for more than 3 weeks. Patient states that a home health nurse just left her home and had done an A1C check and it was 9.8.  Patient states that her blood sugar level is 182 at this time (checked at 11:11 AM today). This morning at 8:40 AM it was 140 At 6:30 AM it was 178. 4:40 AM it was 142 (10/27/2022) 4:30 PM it was 147 (10/27/2022) 1:30 AM it was 192 (10/27/2022)  Patient wants to know what she can do because she is not feeling well.

## 2022-10-28 NOTE — Telephone Encounter (Signed)
Patient notified of medication changes and verbalized understanding

## 2022-10-29 ENCOUNTER — Ambulatory Visit (INDEPENDENT_AMBULATORY_CARE_PROVIDER_SITE_OTHER): Payer: Medicare Other | Admitting: Emergency Medicine

## 2022-10-29 VITALS — BP 120/72 | HR 67 | Temp 97.5°F | Ht 61.0 in | Wt 255.0 lb

## 2022-10-29 DIAGNOSIS — I152 Hypertension secondary to endocrine disorders: Secondary | ICD-10-CM | POA: Diagnosis not present

## 2022-10-29 DIAGNOSIS — Z7901 Long term (current) use of anticoagulants: Secondary | ICD-10-CM | POA: Diagnosis not present

## 2022-10-29 DIAGNOSIS — I5042 Chronic combined systolic (congestive) and diastolic (congestive) heart failure: Secondary | ICD-10-CM | POA: Diagnosis not present

## 2022-10-29 DIAGNOSIS — J984 Other disorders of lung: Secondary | ICD-10-CM | POA: Diagnosis not present

## 2022-10-29 DIAGNOSIS — I480 Paroxysmal atrial fibrillation: Secondary | ICD-10-CM

## 2022-10-29 DIAGNOSIS — Z13820 Encounter for screening for osteoporosis: Secondary | ICD-10-CM | POA: Diagnosis not present

## 2022-10-29 DIAGNOSIS — K219 Gastro-esophageal reflux disease without esophagitis: Secondary | ICD-10-CM

## 2022-10-29 DIAGNOSIS — I700 Atherosclerosis of aorta: Secondary | ICD-10-CM | POA: Diagnosis not present

## 2022-10-29 DIAGNOSIS — E1159 Type 2 diabetes mellitus with other circulatory complications: Secondary | ICD-10-CM | POA: Diagnosis not present

## 2022-10-29 DIAGNOSIS — I2510 Atherosclerotic heart disease of native coronary artery without angina pectoris: Secondary | ICD-10-CM

## 2022-10-29 DIAGNOSIS — E039 Hypothyroidism, unspecified: Secondary | ICD-10-CM

## 2022-10-29 MED ORDER — METOPROLOL TARTRATE 25 MG PO TABS: 25.0000 mg | ORAL_TABLET | Freq: Two times a day (BID) | ORAL | 3 refills | Status: DC

## 2022-10-29 NOTE — Progress Notes (Signed)
[name] 82 y.o.   Chief Complaint  Patient presents with  . Follow-up    Patient states she is retaining fluid, patient states she was seen at home by the nurse with insurance and her weight was 267 lbs     HISTORY OF PRESENT ILLNESS: This is a 82 y.o. female complaining of retaining fluid Seen by visiting nurse recently and recommendations made.  Told she was retaining fluid.  Patient took additional doses of diuretic at home. No other complaints or medical concerns today.  HPI   Prior to Admission medications   Medication Sig Start Date End Date Taking? Authorizing Provider  Accu-Chek Softclix Lancets lancets 1 each by Other route 3 (three) times daily. as directed 03/30/20  Yes Maximiano Coss, NP  allopurinol (ZYLOPRIM) 300 MG tablet TAKE 1 TABLET(300 MG) BY MOUTH DAILY 10/18/21  Yes Bram Hottel, Ines Bloomer, MD  apixaban (ELIQUIS) 5 MG TABS tablet Take 1 tablet (5 mg total) by mouth 2 (two) times daily. 09/05/22  Yes [name], MD  azelastine (ASTELIN) 0.1 % nasal spray 1-2 puffs each nostril at bedtime as needed 01/02/22  Yes Young, Tarri Fuller D, MD  b complex vitamins capsule Take 1 capsule by mouth daily.   Yes [provider]  BD PEN NEEDLE NANO 2ND GEN 32G X 4 MM MISC USE TWICE DAILY AS NEEDED 08/02/22  Yes Pacey Altizer, Ines Bloomer, MD  blood glucose meter kit and supplies Dispense based on patient and insurance preference. Use up to four times daily as directed. (FOR ICD-10 E10.9, E11.9). 08/29/21  Yes Waneta Fitting, Ines Bloomer, MD  BREZTRI AEROSPHERE 160-9-4.8 MCG/ACT AERO USE 2 INHALATIONS BY MOUTH IN  THE MORNING AND AT BEDTIME 11/12/21  Yes Cobb, Karie Schwalbe, NP  cholecalciferol (VITAMIN D3) 25 MCG (1000 UNIT) tablet Take 1,000 Units by mouth daily.   Yes [provider]  clobetasol cream (TEMOVATE) 1.61 % Apply 1 Application topically 2 (two) times daily. 06/05/22  Yes Joban Colledge, Ines Bloomer, MD  cloNIDine (CATAPRES) 0.3 MG tablet TAKE 1 TABLET BY MOUTH   TWICE DAILY 01/28/22  Yes Aavya Shafer, Ines Bloomer, MD  Continuous Blood Gluc Receiver (FREESTYLE LIBRE 14 DAY READER) DEVI Use as directed. 08/29/21  Yes Froylan Hobby, Ines Bloomer, MD  Continuous Blood Gluc Sensor (FREESTYLE LIBRE 2 SENSOR) MISC USE AS DIRECTED TO CHECK BLOOD  SUGAR DAILY 04/06/22  Yes Horald Pollen, MD  dronedarone (MULTAQ) 400 MG tablet Take 1 tablet (400 mg total) by mouth 2 (two) times daily. 12/19/21  Yes [name], MD  Glucagon (GVOKE HYPOPEN 2-PACK) 0.5 MG/0.1ML SOAJ Inject 0.5 mg into the skin daily as needed. For Hypoglycemic events 05/30/22  Yes Tirsa Gail, Ines Bloomer, MD  HYDROcodone-acetaminophen [facility]) 5-325 MG tablet Take 1 tablet by mouth every 6 (six) hours as needed for moderate pain. 08/12/22  Yes Cheronda Erck, Ines Bloomer, MD  hydrOXYzine (ATARAX) 10 MG tablet Take 1 tablet (10 mg total) by mouth every 6 (six) hours as needed. 08/12/22  Yes Rahima Fleishman, Ines Bloomer, MD  Insulin Lispro Prot & Lispro (HUMALOG MIX 75/25 KWIKPEN) (75-25) 100 UNIT/ML Kwikpen Inject 20 Units into the skin 2 (two) times daily. 08/13/22  Yes [name], Melanie Crazier, MD  ipratropium (ATROVENT HFA) 17 MCG/ACT inhaler Inhale 2 puffs into the lungs every 6 (six) hours as needed for wheezing. 12/17/21 12/17/22 Yes Baird Lyons D, MD  ketorolac (ACULAR) 0.5 % ophthalmic solution SMARTSIG:In Eye(s) 08/22/21  Yes [provider]  levothyroxine (SYNTHROID) 100 MCG tablet Take 1 tablet (100 mcg  total) by mouth daily. 03/18/22  Yes Justice Aguirre, Ines Bloomer, MD  lidocaine (LIDODERM) 5 % Place 1 patch onto the skin daily. Remove & Discard patch within 12 hours or as directed by MD 08/29/20  Yes Mcarthur Rossetti, MD  losartan (COZAAR) 100 MG tablet TAKE 1 TABLET BY MOUTH DAILY 10/13/22  Yes Lyndell Gillyard, Ines Bloomer, MD  nystatin powder Apply 1 Application topically 2 (two) times daily. 08/12/22  Yes Jansen Goodpasture, Ines Bloomer, MD  [facility] VERIO test strip 1 each by Other route 3 (three) times daily.  06/08/20  Yes [provider]  RESTASIS MULTIDOSE 0.05 % ophthalmic emulsion Place 1 drop into both eyes 2 (two) times daily. 03/04/20  Yes [provider]  rosuvastatin (CRESTOR) 10 MG tablet TAKE 1 TABLET BY MOUTH IN  THE EVENING 04/03/22  Yes Stanford Strauch, [place], MD  Semaglutide, 2 MG/DOSE, (OZEMPIC, 2 MG/DOSE,) 8 MG/3ML SOPN Inject 2 mg into the skin once a week. 08/13/22  Yes [name], Melanie Crazier, MD  spironolactone (ALDACTONE) 25 MG tablet TAKE 1 TABLET BY MOUTH DAILY 04/28/22  Yes [name], Melanie Crazier, MD  TOBRADEX ophthalmic ointment  08/25/21  Yes [provider]  torsemide (DEMADEX) 20 MG tablet Take 1 tablet (20 mg total) by mouth daily. 05/30/21  Yes Sherran Needs, NP  triamcinolone cream (KENALOG) 0.1 % Apply 1 application topically 2 (two) times daily. 07/26/20  Yes Mikyle Sox, Ines Bloomer, MD  vitamin B-12 (CYANOCOBALAMIN) 1000 MCG tablet Take 1,000 mcg by mouth daily.   Yes [provider]  metoprolol tartrate (LOPRESSOR) 25 MG tablet Take 1 tablet (25 mg total) by mouth 2 (two) times daily. 10/29/22 10/24/23  Horald Pollen, MD  potassium chloride (KLOR-CON) 10 MEQ tablet Take 4 tablets (40 mEq total) by mouth daily. 12/10/21 03/06/22  Shirley Friar, PA-C    Allergies  Allergen Reactions  . Doxycycline     Made patient feel very ill   . Metformin Diarrhea    Patient Active Problem List   Diagnosis Date Noted  . Right hip pain 08/13/2022  . Hematoma of right knee region 07/09/2022  . Primary hyperaldosteronism ([place]) 03/06/2022  . Coronary artery disease involving native coronary artery of native heart without angina pectoris 02/12/2022  . Adrenal adenoma, right 02/07/2022  . Skin lesion of right arm 02/02/2022  . Asthma 11/01/2021  . Normocytic anemia 10/11/2021  . Nodule of right lung 09/30/2021  . Dysthymia 03/12/2021  . Primary osteoarthritis involving multiple joints 03/01/2021  . Recurrent falls 01/10/2021   . Sensorineural hearing loss (SNHL), 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  . Persistent atrial fibrillation ([place]) 08/12/2020  . Type 2 diabetes mellitus with hyperglycemia, with long-term current use of insulin ([place]) 06/21/2020  . Type 2 diabetes mellitus with diabetic polyneuropathy, with long-term current use of insulin ([place]) 06/21/2020  . Mixed hyperlipidemia 06/21/2020  . Uncontrolled hypertension 06/11/2020  . Aortic atherosclerosis ([place]) 03/28/2020  . Diabetic peripheral neuropathy ([place]) 12/26/2019  . Gastroesophageal reflux disease without esophagitis 03/17/2019  . Rash and nonspecific skin eruption 03/17/2019  . Bilateral leg edema 02/16/2019  . Pruritus 02/16/2019  . Varicose veins of both legs with edema 02/16/2019  . Heart murmur 09/05/2018  . Restrictive lung disease 02/22/2018  . Dysgeusia 06/11/2017  . Osteoarthritis of left knee 06/03/2017  . NAFLD (nonalcoholic fatty liver disease) 08/07/2016  . Osteoporosis 12/31/2015  . Obesity 12/19/2015  . Edema 10/18/2015  . Acquired hypothyroidism 10/18/2015  . Prolonged QT interval 10/03/2013  . CHF (congestive heart failure) ([place]) 04/21/2013  .  Long term (current) use of anticoagulants 06/09/2012  . Musculoskeletal back pain   . BENIGN NEOPLASM OF ADRENAL GLAND 11/25/2010  . Chronic diastolic heart failure ([place]) 02/20/2010  . DM 05/16/2009  . GOUT 05/16/2009  . OBESITY, MORBID 05/16/2009  . Hypertension associated with diabetes ([place]) 05/16/2009  . Paroxysmal atrial fibrillation ([place]) 05/16/2009  . HYPERLIPIDEMIA 11/30/2008  . Obstructive sleep apnea on CPAP 11/30/2008    Past Medical History:  Diagnosis Date  . Arthritis   . Back pain   . Chronic anticoagulation    due to aflutter  . Chronic kidney disease   . Diabetes mellitus   . Diastolic CHF, chronic ([place])    a.  echo 2006 - ef 55-65%; mild diast dysfxn;    b. Echo 08/2011: Mild LVH, EF 60%;  c. 04/2013 Echo: EF 65-69%, mild conc LVH;  08/2014 Echo: EF 60-65%, mild-mod MR.   . Gout   . Hyperlipidemia   . Hypertension    a.  Renal arterial Dopplers 12/2011: 1-59% right renal artery stenosis  . Morbid obesity ([place])   . Obstructive sleep apnea on CPAP   . Paroxysmal Afib/Flutter    a. 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 APPENDECTOMY    . ATRIAL FLUTTER ABLATION N/A 09/24/2011   Procedure: ATRIAL FLUTTER ABLATION;  Surgeon: [name], MD;  [facility] C[facility] LAB;  Service: Cardiovascular;  Laterality: N/A;  . CARDIOVERSION  10/22/2011   Procedure: CARDIOVERSION;  Surgeon: [name], MD;  Location: [place];  Service: Cardiovascular;  Laterality: N/A;  . CARDIOVERSION N/A 09/10/2011   Procedure: CARDIOVERSION;  Surgeon: [name], MD;  Location: [facility] CATH LAB;  Service: Cardiovascular;  Laterality: N/A;  . CHOLECYSTECTOMY    . COLONOSCOPY WITH PROPOFOL N/A 06/13/2021   Procedure: COLONOSCOPY WITH PROPOFOL;  Surgeon: Milus Banister, MD;  Location[facility] ENDOSCOPY;  Service: Endoscopy;  Laterality: N/A;  . POLYPECTOMY  06/13/2021   Procedure: POLYPECTOMY;  Surgeon: Milus Banister, MD;  Location: [facility] ENDOSCOPY;  Service: Endoscopy;;  . TONSILLECTOMY  1982  . TOTAL ABDOMINAL HYSTERECTOMY      Social History   Socioeconomic History  . Marital status: Married    Spouse name: Not on file  . Number of children: 3  . Years of education: Not on file  . Highest education level: Not on file  Occupational History  . Occupation: DISABILITY/housewife    Employer: RETIRED  Tobacco Use  . Smoking status: Never  . Smokeless tobacco: Never  Vaping Use  . Vaping Use: Never used  Substance and Sexual Activity  . Alcohol use: No  . Drug use: No  . Sexual activity: Yes  Other Topics Concern  . Not on file  Social History Narrative  . Not on file   Social Determinants of Health   Financial Resource Strain: Low Risk  (11/08/2021)   Overall Financial Resource Strain (CARDIA)   . Difficulty of Paying  Living Expenses: Not hard at all  Food Insecurity: No Food Insecurity (06/02/2022)   Hunger Vital Sign   . Worried About Charity fundraiser in the Last Year: Never true   . Ran Out of Food in the Last Year: Never true  Transportation Needs: No Transportation Needs (06/02/2022)   PRAPARE - Transportation   . Lack of Transportation (Medical): No   . Lack of Transportation (Non-Medical): No  Physical Activity: Inactive (11/08/2021)   Exercise Vital Sign   .  Days of Exercise per Week: 0 days   . Minutes of Exercise per Session: 0 min  Stress: No Stress Concern Present (11/08/2021)   [place]   . Feeling of Stress : Not at all  Social Connections: Socially Integrated (11/08/2021)   Social Connection and Isolation Panel [NHANES]   . Frequency of Communication with Friends and Family: More than three times a week   . Frequency of Social Gatherings with Friends and Family: Never   . Attends Religious Services: More than 4 times per year   . Active Member of Clubs or Organizations: Yes   . Attends Archivist Meetings: More than 4 times per year   . Marital Status: Married  Human resources officer Violence: Not At Risk (11/08/2021)   Humiliation, Afraid, Rape, and Kick questionnaire   . Fear of Current or Ex-Partner: No   . Emotionally Abused: No   . Physically Abused: No   . Sexually Abused: No    Family History  Problem Relation Age of Onset  . Heart disease Father   . Hypertension Father   . Breast cancer Sister   . Cancer Sister        breast  . Colon cancer Neg Hx   . Esophageal cancer Neg Hx   . Pancreatic cancer Neg Hx   . Liver disease Neg Hx      Review of Systems  Constitutional: Negative.  Negative for chills and fever.  HENT: Negative.  Negative for congestion and sore throat.   Respiratory: Negative.  Negative for cough and shortness of breath.   Cardiovascular: Negative.  Negative for chest pain  and palpitations.  Gastrointestinal:  Negative for abdominal pain, diarrhea, nausea and vomiting.  Genitourinary: Negative.  Negative for dysuria and hematuria.  Skin: Negative.  Negative for rash.  Neurological: Negative.  Negative for dizziness and headaches.  All other systems reviewed and are negative.  Today's Vitals   10/29/22 1432  BP: 120/72  Pulse: 67  Temp: (!) 97.5 F (36.4 C)  TempSrc: Oral  SpO2: 97%  Weight: 255 lb (115.7 kg)  Height: '5\' 1"'$  (1.549 m)   Body mass index is 48.18 kg/m. Wt Readings from Last 3 Encounters:  10/29/22 255 lb (115.7 kg)  09/26/22 251 lb (113.9 kg)  08/13/22 257 lb (116.6 kg)     Physical Exam Vitals reviewed.  Constitutional:      Appearance: Normal appearance.  HENT:     Head: Normocephalic.  Eyes:     Extraocular Movements: Extraocular movements intact.     Pupils: Pupils are equal, round, and reactive to light.  Cardiovascular:     Rate and Rhythm: Normal rate and regular rhythm.     Pulses: Normal pulses.     Heart sounds: Normal heart sounds.  Pulmonary:     Effort: Pulmonary effort is normal.     Breath sounds: Normal breath sounds.  Musculoskeletal:     Cervical back: No tenderness.     Right lower leg: No edema.     Left lower leg: No edema.  Lymphadenopathy:     Cervical: No cervical adenopathy.  Skin:    General: Skin is warm and dry.  Neurological:     General: No focal deficit present.     Mental Status: She is alert and oriented to person, place, and time.  Psychiatric:        Mood and Affect: Mood normal.  Behavior: Behavior normal.     ASSESSMENT & PLAN: A total of 47 minutes was spent with the patient and counseling/coordination of care regarding preparing for this visit, review of most recent office visit notes, review of multiple chronic medical conditions under management, review of all medications, education on nutrition, documentation, need for follow-up  .    Agustina Caroli,  MD [facility]

## 2022-10-29 NOTE — Patient Instructions (Signed)
Health Maintenance After Age 80 After age 80, you are at a higher risk for certain long-term diseases and infections as well as injuries from falls. Falls are a major cause of broken bones and head injuries in people who are older than age 80. Getting regular preventive care can help to keep you healthy and well. Preventive care includes getting regular testing and making lifestyle changes as recommended by your health care provider. Talk with your health care provider about: Which screenings and tests you should have. A screening is a test that checks for a disease when you have no symptoms. A diet and exercise plan that is right for you. What should I know about screenings and tests to prevent falls? Screening and testing are the best ways to find a health problem early. Early diagnosis and treatment give you the best chance of managing medical conditions that are common after age 80. Certain conditions and lifestyle choices may make you more likely to have a fall. Your health care provider may recommend: Regular vision checks. Poor vision and conditions such as cataracts can make you more likely to have a fall. If you wear glasses, make sure to get your prescription updated if your vision changes. Medicine review. Work with your health care provider to regularly review all of the medicines you are taking, including over-the-counter medicines. Ask your health care provider about any side effects that may make you more likely to have a fall. Tell your health care provider if any medicines that you take make you feel dizzy or sleepy. Strength and balance checks. Your health care provider may recommend certain tests to check your strength and balance while standing, walking, or changing positions. Foot health exam. Foot pain and numbness, as well as not wearing proper footwear, can make you more likely to have a fall. Screenings, including: Osteoporosis screening. Osteoporosis is a condition that causes  the bones to get weaker and break more easily. Blood pressure screening. Blood pressure changes and medicines to control blood pressure can make you feel dizzy. Depression screening. You may be more likely to have a fall if you have a fear of falling, feel depressed, or feel unable to do activities that you used to do. Alcohol use screening. Using too much alcohol can affect your balance and may make you more likely to have a fall. Follow these instructions at home: Lifestyle Do not drink alcohol if: Your health care provider tells you not to drink. If you drink alcohol: Limit how much you have to: 0-1 drink a day for women. 0-2 drinks a day for men. Know how much alcohol is in your drink. In the [place] one drink equals one 12 oz bottle of beer (355 mL), one 5 oz glass of wine (148 mL), or one 1 oz glass of hard liquor (44 mL). Do not use any products that contain nicotine or tobacco. These products include cigarettes, chewing tobacco, and vaping devices, such as e-cigarettes. If you need help quitting, ask your health care provider. Activity  Follow a regular exercise program to stay fit. This will help you maintain your balance. Ask your health care provider what types of exercise are appropriate for you. If you need a cane or walker, use it as recommended by your health care provider. Wear supportive shoes that have nonskid soles. Safety  Remove any tripping hazards, such as rugs, cords, and clutter. Install safety equipment such as grab bars in bathrooms and safety rails on stairs. Keep rooms and walkways   well-lit. General instructions Talk with your health care provider about your risks for falling. Tell your health care provider if: You fall. Be sure to tell your health care provider about all falls, even ones that seem minor. You feel dizzy, tiredness (fatigue), or off-balance. Take over-the-counter and prescription medicines only as told by your health care provider. These include  supplements. Eat a healthy diet and maintain a healthy weight. A healthy diet includes low-fat dairy products, low-fat (lean) meats, and fiber from whole grains, beans, and lots of fruits and vegetables. Stay current with your vaccines. Schedule regular health, dental, and eye exams. Summary Having a healthy lifestyle and getting preventive care can help to protect your health and wellness after age 80. Screening and testing are the best way to find a health problem early and help you avoid having a fall. Early diagnosis and treatment give you the best chance for managing medical conditions that are more common for people who are older than age 80. Falls are a major cause of broken bones and head injuries in people who are older than age 80. Take precautions to prevent a fall at home. Work with your health care provider to learn what changes you can make to improve your health and wellness and to prevent falls. This information is not intended to replace advice given to you by your health care provider. Make sure you discuss any questions you have with your health care provider. Document Revised: 02/25/2021 Document Reviewed: 02/25/2021 Elsevier Patient Education  2023 Elsevier Inc.  

## 2022-10-30 ENCOUNTER — Telehealth: Payer: Self-pay | Admitting: Emergency Medicine

## 2022-10-30 ENCOUNTER — Encounter: Payer: Self-pay | Admitting: Emergency Medicine

## 2022-10-30 NOTE — Telephone Encounter (Signed)
Already addressed.  Thank you.

## 2022-10-30 NOTE — Assessment & Plan Note (Signed)
Stable. Lab Results  Component Value Date   TSH 0.84 05/29/2022  Continue Synthroid 100 mcg daily.

## 2022-10-30 NOTE — Telephone Encounter (Signed)
While on the phone scheduling AWV for patient she asked that I send a note to provider stating that her nurse that does home visits wanted her Thyroid rechecked. She states she forgot to mention this while in the office yesterday. I advised patient I would send a note to clinical staff and someone would call her back in regards to this request.

## 2022-10-30 NOTE — Assessment & Plan Note (Signed)
No signs or symptoms of acute congestive heart failure. Not retaining fluid.  No peripheral edema. Wt Readings from Last 3 Encounters:  10/29/22 255 lb (115.7 kg)  09/26/22 251 lb (113.9 kg)  08/13/22 257 lb (116.6 kg)  Continues torsemide 20 mg daily.

## 2022-10-30 NOTE — Assessment & Plan Note (Signed)
Fall precautions given.

## 2022-10-30 NOTE — Assessment & Plan Note (Signed)
Diet and nutrition discussed.  Advised to decrease amount of daily carbohydrate intake and daily calories and increase amount of plant-based protein in her diet. 

## 2022-10-30 NOTE — Assessment & Plan Note (Signed)
Stable.  Continue Breztri 2 puffs twice a day

## 2022-10-30 NOTE — Assessment & Plan Note (Addendum)
Sinus rhythm today.  Well-controlled ventricular rate. Continues to be on Eliquis 5 mg twice a day and metoprolol tartrate 25 mg twice a day Continues to be on dronedarone 400 mg twice a day Follows up with cardiologist on a regular basis.

## 2022-10-30 NOTE — Assessment & Plan Note (Signed)
Currently on statin treatment with rosuvastatin 10 mg daily.

## 2022-10-30 NOTE — Assessment & Plan Note (Signed)
No recent anginal episodes.  Stable.

## 2022-10-30 NOTE — Assessment & Plan Note (Signed)
Well-controlled hypertension Losartan 100 mg daily and metoprolol tartrate 25 mg twice a day Aldactone 25 mg daily Diabetes managed by endocrinologist Patient on insulin pump and Ozempic

## 2022-10-30 NOTE — Assessment & Plan Note (Signed)
Stable. Asymptomatic.

## 2022-11-05 ENCOUNTER — Other Ambulatory Visit: Payer: Self-pay | Admitting: Emergency Medicine

## 2022-11-05 DIAGNOSIS — I152 Hypertension secondary to endocrine disorders: Secondary | ICD-10-CM

## 2022-11-07 ENCOUNTER — Other Ambulatory Visit: Payer: Self-pay | Admitting: Internal Medicine

## 2022-11-07 DIAGNOSIS — I480 Paroxysmal atrial fibrillation: Secondary | ICD-10-CM

## 2022-11-09 ENCOUNTER — Other Ambulatory Visit: Payer: Self-pay | Admitting: Emergency Medicine

## 2022-11-10 NOTE — Telephone Encounter (Signed)
Eliquis '5mg'$  refill request received. Patient is 82 years old, weight-115.7kg, Crea-1.16 on 05/29/2022, Diagnosis-afib, and last seen by Dr. Caryl Comes on 09/26/2022. Dose is appropriate based on dosing criteria. Will send in refill to requested pharmacy.

## 2022-11-13 ENCOUNTER — Other Ambulatory Visit: Payer: Self-pay | Admitting: Emergency Medicine

## 2022-11-13 DIAGNOSIS — M109 Gout, unspecified: Secondary | ICD-10-CM

## 2022-11-19 ENCOUNTER — Telehealth: Payer: Self-pay | Admitting: Internal Medicine

## 2022-11-19 NOTE — Telephone Encounter (Signed)
Patient called to advise that she has gained 7 pounds in the last 2 weeks and that her A1C was 9.8 when the [employer] Nurse came to her home for a wellness visit.  Patient advises that [facility] nurse advised her to call her Endocrinologist for assistance. When asked patient states no high blood sugars, illness, etc.  Patient is requesting call back at [phone]

## 2022-11-20 NOTE — Telephone Encounter (Signed)
Patient scheduled for 12/08/22

## 2022-11-21 ENCOUNTER — Ambulatory Visit (INDEPENDENT_AMBULATORY_CARE_PROVIDER_SITE_OTHER): Payer: Medicare Other

## 2022-11-21 VITALS — Ht 61.0 in | Wt 255.0 lb

## 2022-11-21 DIAGNOSIS — Z0000 Encounter for general adult medical examination without abnormal findings: Secondary | ICD-10-CM | POA: Diagnosis not present

## 2022-11-21 DIAGNOSIS — Z13820 Encounter for screening for osteoporosis: Secondary | ICD-10-CM

## 2022-11-21 NOTE — Patient Instructions (Addendum)
[name] , Thank you for taking time to come for your Medicare Wellness Visit. I appreciate your ongoing commitment to your health goals. Please review the following plan we discussed and let me know if I can assist you in the future.   These are the goals we discussed:  Goals      Personal Care Services     Care Coordination Interventions: Advised patient to Call DSS to find out why her Medicaid stopped Provided contact information for [place] and [facility] [place] DSS Solution-Focused Strategies employed:  Active listening / Reflection utilized  Problem [place] strategies reviewed Assessed barriers        Set My Target A1C-Diabetes Type 2     Timeframe:  Long-Range Goal Priority:  High Start Date:  05/20/2021                           Expected End Date:  12/19/2022                   Follow Up Date 03/16/2022   - set target A1C goal <7.0%    Why is this important?   Your target A1C is decided together by you and your doctor.  It is based on several things like your age and other health issues.  A1c control prevents damage to eyes, heart, kidneys, nerves, and immune system when well contorlled       Track and Manage Symptoms-Heart Failure     Timeframe:  Long-Range Goal Priority:  High Start Date:  05/20/2021                           Expected End Date:  12/19/2022                   Follow Up Date 03/16/2022   - develop a rescue plan - eat more whole grains, fruits and vegetables, lean meats and healthy fats - follow rescue plan if symptoms flare-up - know when to call the doctor - track symptoms and what helps feel better or worse - dress right for the weather, hot or cold    Why is this important?   You will be able to handle your symptoms better if you keep track of them.  Making some simple changes to your lifestyle will help.  Eating healthy is one thing you can do to take good care of yourself.          This is a list of the screening recommended for  you and due dates:  Health Maintenance  Topic Date Due   Hepatitis C Screening: USPSTF Recommendation to screen - Ages 20-79 yo.  Never done   Zoster (Shingles) Vaccine (1 of 2) Never done   Yearly kidney health urinalysis for diabetes  06/21/2021   Eye exam for diabetics  12/06/2022*   Flu Shot  01/18/2023*   Complete foot exam   02/08/2023   Hemoglobin A1C  02/12/2023   Yearly kidney function blood test for diabetes  05/30/2023   Medicare Annual Wellness Visit  11/22/2023   Pneumonia Vaccine  Completed   DEXA scan (bone density measurement)  Completed   HPV Vaccine  Aged Out   DTaP/Tdap/Td vaccine  Discontinued   Colon Cancer Screening  Discontinued   COVID-19 Vaccine  Discontinued  *Topic was postponed. The date shown is not the original due date.    Advanced directives: Yes  Conditions/risks identified: Yes; Type II Diabetes   Next appointment: Follow up in one year for your annual wellness visit.   Preventive Care 80 Years and Older, Female Preventive care refers to lifestyle choices and visits with your health care provider that can promote health and wellness. What does preventive care include? A yearly physical exam. This is also called an annual well check. Dental exams once or twice a year. Routine eye exams. Ask your health care provider how often you should have your eyes checked. Personal lifestyle choices, including: Daily care of your teeth and gums. Regular physical activity. Eating a healthy diet. Avoiding tobacco and drug use. Limiting alcohol use. Practicing safe sex. Taking low-dose aspirin every day. Taking vitamin and mineral supplements as recommended by your health care provider. What happens during an annual well check? The services and screenings done by your health care provider during your annual well check will depend on your age, overall health, lifestyle risk factors, and family history of disease. Counseling  Your health care provider may  ask you questions about your: Alcohol use. Tobacco use. Drug use. Emotional well-being. Home and relationship well-being. Sexual activity. Eating habits. History of falls. Memory and ability to understand (cognition). Work and work Statistician. Reproductive health. Screening  You may have the following tests or measurements: Height, weight, and BMI. Blood pressure. Lipid and cholesterol levels. These may be checked every 5 years, or more frequently if you are over 36 years old. Skin check. Lung cancer screening. You may have this screening every year starting at age 76 if you have a 30-pack-year history of smoking and currently smoke or have quit within the past 15 years. Fecal occult blood test (FOBT) of the stool. You may have this test every year starting at age 104. Flexible sigmoidoscopy or colonoscopy. You may have a sigmoidoscopy every 5 years or a colonoscopy every 10 years starting at age 35. Hepatitis C blood test. Hepatitis B blood test. Sexually transmitted disease (STD) testing. Diabetes screening. This is done by checking your blood sugar (glucose) after you have not eaten for a while (fasting). You may have this done every 1-3 years. Bone density scan. This is done to screen for osteoporosis. You may have this done starting at age 32. Mammogram. This may be done every 1-2 years. Talk to your health care provider about how often you should have regular mammograms. Talk with your health care provider about your test results, treatment options, and if necessary, the need for more tests. Vaccines  Your health care provider may recommend certain vaccines, such as: Influenza vaccine. This is recommended every year. Tetanus, diphtheria, and acellular pertussis (Tdap, Td) vaccine. You may need a Td booster every 10 years. Zoster vaccine. You may need this after age 42. Pneumococcal 13-valent conjugate (PCV13) vaccine. One dose is recommended after age 37. Pneumococcal  polysaccharide (PPSV23) vaccine. One dose is recommended after age 70. Talk to your health care provider about which screenings and vaccines you need and how often you need them. This information is not intended to replace advice given to you by your health care provider. Make sure you discuss any questions you have with your health care provider. Document Released: 11/02/2015 Document Revised: 06/25/2016 Document Reviewed: 08/07/2015 Elsevier Interactive Patient Education  2017 [place] Prevention in the Home Falls can cause injuries. They can happen to people of all ages. There are many things you can do to make your home safe and to help prevent falls. What  can I do on the outside of my home? Regularly fix the edges of walkways and driveways and fix any cracks. Remove anything that might make you trip as you walk through a door, such as a raised step or threshold. Trim any bushes or trees on the path to your home. Use bright outdoor lighting. Clear any walking paths of anything that might make someone trip, such as rocks or tools. Regularly check to see if handrails are loose or broken. Make sure that both sides of any steps have handrails. Any raised decks and porches should have guardrails on the edges. Have any leaves, snow, or ice cleared regularly. Use sand or salt on walking paths during winter. Clean up any spills in your garage right away. This includes oil or grease spills. What can I do in the bathroom? Use night lights. Install grab bars by the toilet and in the tub and shower. Do not use towel bars as grab bars. Use non-skid mats or decals in the tub or shower. If you need to sit down in the shower, use a plastic, non-slip stool. Keep the floor dry. Clean up any water that spills on the floor as soon as it happens. Remove soap buildup in the tub or shower regularly. Attach bath mats securely with double-sided non-slip rug tape. Do not have throw rugs and other  things on the floor that can make you trip. What can I do in the bedroom? Use night lights. Make sure that you have a light by your bed that is easy to reach. Do not use any sheets or blankets that are too big for your bed. They should not hang down onto the floor. Have a firm chair that has side arms. You can use this for support while you get dressed. Do not have throw rugs and other things on the floor that can make you trip. What can I do in the kitchen? Clean up any spills right away. Avoid walking on wet floors. Keep items that you use a lot in easy-to-reach places. If you need to reach something above you, use a strong step stool that has a grab bar. Keep electrical cords out of the way. Do not use floor polish or wax that makes floors slippery. If you must use wax, use non-skid floor wax. Do not have throw rugs and other things on the floor that can make you trip. What can I do with my stairs? Do not leave any items on the stairs. Make sure that there are handrails on both sides of the stairs and use them. Fix handrails that are broken or loose. Make sure that handrails are as long as the stairways. Check any carpeting to make sure that it is firmly attached to the stairs. Fix any carpet that is loose or worn. Avoid having throw rugs at the top or bottom of the stairs. If you do have throw rugs, attach them to the floor with carpet tape. Make sure that you have a light switch at the top of the stairs and the bottom of the stairs. If you do not have them, ask someone to add them for you. What else can I do to help prevent falls? Wear shoes that: Do not have high heels. Have rubber bottoms. Are comfortable and fit you well. Are closed at the toe. Do not wear sandals. If you use a stepladder: Make sure that it is fully opened. Do not climb a closed stepladder. Make sure that both sides of the  stepladder are locked into place. Ask someone to hold it for you, if possible. Clearly  mark and make sure that you can see: Any grab bars or handrails. First and last steps. Where the edge of each step is. Use tools that help you move around (mobility aids) if they are needed. These include: Canes. Walkers. Scooters. Crutches. Turn on the lights when you go into a dark area. Replace any light bulbs as soon as they burn out. Set up your furniture so you have a clear path. Avoid moving your furniture around. If any of your floors are uneven, fix them. If there are any pets around you, be aware of where they are. Review your medicines with your doctor. Some medicines can make you feel dizzy. This can increase your chance of falling. Ask your doctor what other things that you can do to help prevent falls. This information is not intended to replace advice given to you by your health care provider. Make sure you discuss any questions you have with your health care provider. Document Released: 08/02/2009 Document Revised: 03/13/2016 Document Reviewed: 11/10/2014 Elsevier Interactive Patient Education  2017 [employer].

## 2022-11-21 NOTE — Progress Notes (Signed)
Virtual Visit via Telephone Note  I connected with  [name] on 11/21/22 at 10:15 AM EST by telephone and verified that I am speaking with the correct person using two identifiers.  Location: Patient: Home Provider: [place] Persons participating in the virtual visit: [name]   I discussed the limitations, risks, security and privacy concerns of performing an evaluation and management service by telephone and the availability of in person appointments. The patient expressed understanding and agreed to proceed.  Interactive audio and video telecommunications were attempted between this nurse and patient, however failed, due to patient having technical difficulties OR patient did not have access to video capability.  We continued and completed visit with audio only.  Some vital signs may be absent or patient reported.   [name], LPN  Subjective:   [name] is a 82 y.o. female who presents for Medicare Annual (Subsequent) preventive examination.  Review of Systems     Cardiac Risk Factors include: advanced age (>47mn, >>51women);diabetes mellitus;dyslipidemia;family history of premature cardiovascular disease;hypertension;obesity (BMI >30kg/m2);sedentary lifestyle     Objective:    Today's Vitals   11/21/22 1026 11/21/22 1028  Weight: 255 lb (115.7 kg)   Height: '5\' 1"'$  (1.549 m)   PainSc: 0-No pain 0-No pain   Body mass index is 48.18 kg/m.     11/21/2022   10:19 AM 11/08/2021    2:41 PM 06/13/2021    8:50 AM 05/08/2021    7:28 AM 03/25/2021    1:00 PM 10/29/2020   10:20 AM 08/21/2020    2:09 PM  Advanced Directives  Does Patient Have a Medical Advance Directive? Yes Yes No No No No No  Type of [place]      Does patient want to make changes to medical advance directive?  No - Patient declined   No - Patient declined    Copy of [place] Chart? No - copy requested No - copy requested       Would patient like information on creating a medical advance directive?   No - Patient declined No - Patient declined  Yes (ED - Information included in AVS) No - Guardian declined    Current Medications (verified) Outpatient Encounter Medications as of 11/21/2022  Medication Sig   Accu-Chek Softclix Lancets lancets 1 each by Other route 3 (three) times daily. as directed   allopurinol (ZYLOPRIM) 300 MG tablet TAKE 1 TABLET BY MOUTH DAILY   apixaban (ELIQUIS) 5 MG TABS tablet TAKE 1 TABLET BY MOUTH TWICE  DAILY   azelastine (ASTELIN) 0.1 % nasal spray 1-2 puffs each nostril at bedtime as needed   b complex vitamins capsule Take 1 capsule by mouth daily.   BD PEN NEEDLE NANO 2ND GEN 32G X 4 MM MISC USE TWICE DAILY AS NEEDED   blood glucose meter kit and supplies Dispense based on patient and insurance preference. Use up to four times daily as directed. (FOR ICD-10 E10.9, E11.9).   BREZTRI AEROSPHERE 160-9-4.8 MCG/ACT AERO USE 2 INHALATIONS BY MOUTH IN  THE MORNING AND AT BEDTIME   cholecalciferol (VITAMIN D3) 25 MCG (1000 UNIT) tablet Take 1,000 Units by mouth daily.   clobetasol cream (TEMOVATE) 04.09% Apply 1 Application topically 2 (two) times daily.   cloNIDine (CATAPRES) 0.3 MG tablet TAKE 1 TABLET BY MOUTH TWICE  DAILY   Continuous Blood Gluc Receiver (FREESTYLE LIBRE 14 DAY READER) DEVI  Use as directed.   Continuous Blood Gluc Sensor (FREESTYLE LIBRE 2 SENSOR) MISC USE AS DIRECTED TO CHECK BLOOD  SUGAR DAILY   dronedarone (MULTAQ) 400 MG tablet Take 1 tablet (400 mg total) by mouth 2 (two) times daily.   Glucagon (GVOKE HYPOPEN 2-PACK) 0.5 MG/0.1ML SOAJ Inject 0.5 mg into the skin daily as needed. For Hypoglycemic events   HYDROcodone-acetaminophen (NORCO) 5-325 MG tablet Take 1 tablet by mouth every 6 (six) hours as needed for moderate pain.   hydrOXYzine (ATARAX) 10 MG tablet Take 1 tablet (10 mg total) by mouth every 6  (six) hours as needed.   Insulin Lispro Prot & Lispro (HUMALOG MIX 75/25 KWIKPEN) (75-25) 100 UNIT/ML Kwikpen Inject 20 Units into the skin 2 (two) times daily.   ipratropium (ATROVENT HFA) 17 MCG/ACT inhaler Inhale 2 puffs into the lungs every 6 (six) hours as needed for wheezing.   ketorolac (ACULAR) 0.5 % ophthalmic solution SMARTSIG:In Eye(s)   levothyroxine (SYNTHROID) 100 MCG tablet Take 1 tablet (100 mcg total) by mouth daily.   lidocaine (LIDODERM) 5 % Place 1 patch onto the skin daily. Remove & Discard patch within 12 hours or as directed by MD   losartan (COZAAR) 100 MG tablet TAKE 1 TABLET BY MOUTH DAILY   metoprolol tartrate (LOPRESSOR) 25 MG tablet Take 1 tablet (25 mg total) by mouth 2 (two) times daily.   nystatin powder Apply 1 Application topically 2 (two) times daily.   ONETOUCH VERIO test strip 1 each by Other route 3 (three) times daily.   RESTASIS MULTIDOSE 0.05 % ophthalmic emulsion Place 1 drop into both eyes 2 (two) times daily.   rosuvastatin (CRESTOR) 10 MG tablet TAKE 1 TABLET BY MOUTH IN  THE EVENING   Semaglutide, 2 MG/DOSE, (OZEMPIC, 2 MG/DOSE,) 8 MG/3ML SOPN Inject 2 mg into the skin once a week.   spironolactone (ALDACTONE) 25 MG tablet TAKE 1 TABLET BY MOUTH DAILY   TOBRADEX ophthalmic ointment    torsemide (DEMADEX) 20 MG tablet Take 1 tablet (20 mg total) by mouth daily.   triamcinolone cream (KENALOG) 0.1 % Apply 1 application topically 2 (two) times daily.   vitamin B-12 (CYANOCOBALAMIN) 1000 MCG tablet Take 1,000 mcg by mouth daily.   [DISCONTINUED] potassium chloride (KLOR-CON) 10 MEQ tablet Take 4 tablets (40 mEq total) by mouth daily.   No facility-administered encounter medications on file as of 11/21/2022.    Allergies (verified) Doxycycline and Metformin   History: Past Medical History:  Diagnosis Date   Arthritis    Back pain    Chronic anticoagulation    due to aflutter   Chronic kidney disease    Diabetes mellitus    Diastolic CHF,  chronic ([place])    a.  echo 2006 - ef 55-65%; mild diast dysfxn;    b. Echo 08/2011: Mild LVH, EF 60%;  c. 04/2013 Echo: EF 65-69%, mild conc LVH;  08/2014 Echo: EF 60-65%, mild-mod MR.   Gout    Hyperlipidemia    Hypertension    a.  Renal arterial Dopplers 12/2011: 1-59% right renal artery stenosis   Morbid obesity ([place])    Obstructive sleep apnea on CPAP    Paroxysmal Afib/Flutter    a. 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   Past Surgical History:  Procedure Laterality Date   APPENDECTOMY     ATRIAL FLUTTER ABLATION N/A 09/24/2011   Procedure: ATRIAL FLUTTER ABLATION;  Surgeon: [name], MD;  [facility] C[facility] LAB;  Service: Cardiovascular;  Laterality: N/A;  CARDIOVERSION  10/22/2011   Procedure: CARDIOVERSION;  Surgeon: [name], MD;  Location: [facility];  Service: Cardiovascular;  Laterality: N/A;   CARDIOVERSION N/A 09/10/2011   Procedure: CARDIOVERSION;  Surgeon: [name], MD;  Location: [facility] CATH LAB;  Service: Cardiovascular;  Laterality: N/A;   CHOLECYSTECTOMY     COLONOSCOPY WITH PROPOFOL N/A 06/13/2021   Procedure: COLONOSCOPY WITH PROPOFOL;  Surgeon: Milus Banister, MD;  Location: [facility] ENDOSCOPY;  Service: Endoscopy;  Laterality: N/A;   POLYPECTOMY  06/13/2021   Procedure: POLYPECTOMY;  Surgeon: Milus Banister, MD;  Location: [facility] ENDOSCOPY;  Service: Endoscopy;;   TONSILLECTOMY  1982   TOTAL ABDOMINAL HYSTERECTOMY     Family History  Problem Relation Age of Onset   Heart disease Father    Hypertension Father    Breast cancer Sister    Cancer Sister        breast   Colon cancer Neg Hx    Esophageal cancer Neg Hx    Pancreatic cancer Neg Hx    Liver disease Neg Hx    Social History   Socioeconomic History   Marital status: Married    Spouse name: Not on file   Number of children: 3   Years of education: Not on file   Highest education level: Not on file  Occupational History   Occupation: DISABILITY/housewife    Employer: RETIRED  Tobacco Use    Smoking status: Never   Smokeless tobacco: Never  Vaping Use   Vaping Use: Never used  Substance and Sexual Activity   Alcohol use: No   Drug use: No   Sexual activity: Yes  Other Topics Concern   Not on file  Social History Narrative   Not on file   Social Determinants of Health   Financial Resource Strain: Low Risk  (11/21/2022)   Overall Financial Resource Strain (CARDIA)    Difficulty of Paying Living Expenses: Not hard at all  Food Insecurity: No Food Insecurity (11/21/2022)   Hunger Vital Sign    Worried About Running Out of Food in the Last Year: Never true    [place] in the Last Year: Never true  Transportation Needs: No Transportation Needs (11/21/2022)   PRAPARE - Hydrologist (Medical): No    Lack of Transportation (Non-Medical): No  Physical Activity: Inactive (11/21/2022)   Exercise Vital Sign    Days of Exercise per Week: 0 days    Minutes of Exercise per Session: 0 min  Stress: No Stress Concern Present (11/21/2022)   [place]    Feeling of Stress : Not at all  Social Connections: [place] (11/21/2022)   Social Connection and Isolation Panel [NHANES]    Frequency of Communication with Friends and Family: More than three times a week    Frequency of Social Gatherings with Friends and Family: Never    Attends Religious Services: More than 4 times per year    Active Member of [employer] or Organizations: Yes    Attends Music therapist: More than 4 times per year    Marital Status: Married    Tobacco Counseling Counseling given: Not Answered   Clinical Intake:  Pre-visit preparation completed: Yes  Pain : No/denies pain Pain Score: 0-No pain Effect of Pain on Daily Activities: Pain can diminish job performance, lower motivation to exercise and prevent you from completing daily tasks.  Pain produces disability and affects the  quality of  life.     BMI - recorded: 48.18 Nutritional Status: BMI > 30  Obese Nutritional Risks: None Diabetes: Yes CBG done?: No Did pt. bring in CBG monitor from home?: No  How often do you need to have someone help you when you read instructions, pamphlets, or other written materials from your doctor or pharmacy?: 1 - Never What is the last grade level you completed in school?: HSG  Nutrition Risk Assessment:  Has the patient had any N/V/D within the last 2 months?  No  Does the patient have any non-healing wounds?  No  Has the patient had any unintentional weight loss or weight gain?  No   Diabetes:  Is the patient diabetic?  Yes  If diabetic, was a CBG obtained today?  No  Did the patient bring in their glucometer from home?  No  How often do you monitor your CBG's? 3 times a day.   Financial Strains and Diabetes Management:  Are you having any financial strains with the device, your supplies or your medication? No .  Does the patient want to be seen by Chronic Care Management for management of their diabetes?  No  Would the patient like to be referred to a Nutritionist or for Diabetic Management?  No   Diabetic Exams:  Diabetic Eye Exam: Overdue for diabetic eye exam. Pt has been advised about the importance in completing this exam. Patient advised to call and schedule an eye exam. Diabetic Foot Exam: Completed 02/07/2022   Interpreter Needed?: No  Information entered by :: Lisette Abu, LPN.   Activities of Daily Living    11/21/2022   10:31 AM  In your present state of health, do you have any difficulty performing the following activities:  Hearing? 0  Vision? 0  Difficulty concentrating or making decisions? 0  Walking or climbing stairs? 0  Dressing or bathing? 0  Doing errands, shopping? 0  Preparing Food and eating ? N  Using the Toilet? N  In the past six months, have you accidently leaked urine? N  Do you have problems with loss of bowel control? N   Managing your Medications? N  Managing your Finances? N  Housekeeping or managing your Housekeeping? N    Patient Care Team: Horald Pollen, MD as PCP - General (Internal Medicine) [name], MD as PCP - Electrophysiology (Cardiology) Shawnee Knapp, MD as Resident (Family Medicine) [place], Darnelle Maffucci, [facility] (Inactive) as Pharmacist (Pharmacist) Gevena Cotton, MD as Consulting Physician (Ophthalmology) [facility]  Indicate any recent Medical Services you may have received from other than [facility] providers in the past year (date may be approximate).     Assessment:   This is a routine wellness examination for [place].  Hearing/Vision screen Hearing Screening - Comments:: Denies hearing difficulties   Vision Screening - Comments:: Wears rx glasses - up to date with routine eye exams with Gevena Cotton, MD.   Dietary issues and exercise activities discussed: Current Exercise Habits: The patient does not participate in regular exercise at present, Exercise limited by: cardiac condition(s);respiratory conditions(s)   Goals Addressed   None   Depression Screen    10/29/2022    2:38 PM 10/29/2022    2:34 PM 08/12/2022    1:27 PM 07/09/2022    2:39 PM 06/05/2022    9:06 AM 05/29/2022   10:22 AM 05/13/2022    9:08 AM  PHQ 2/9 Scores  PHQ - 2 Score 0 0 0  0 0 0 0  PHQ- 9 Score 0    2 2     Fall Risk    11/21/2022   10:19 AM 10/29/2022    2:34 PM 08/12/2022    1:27 PM 07/09/2022    2:39 PM 06/05/2022    9:06 AM  Fall Risk   Falls in the past year? 0 0 0 0 0  Number falls in past yr: 0 0 0 0 0  Injury with Fall? 0 0 0 0 0  Risk for fall due to : No Fall Risks Impaired vision No Fall Risks No Fall Risks No Fall Risks  Follow up Falls prevention discussed  Falls evaluation completed Falls evaluation completed Falls evaluation completed    [place]:  Any stairs in or around the home? No  If so, are there any without  handrails? No  Home free of loose throw rugs in walkways, pet beds, electrical cords, etc? Yes  Adequate lighting in your home to reduce risk of falls? Yes   ASSISTIVE DEVICES UTILIZED TO PREVENT FALLS:  Life alert? No  Use of a cane, walker or w/c? No  Grab bars in the bathroom? No  Shower chair or bench in shower? No  Elevated toilet seat or a handicapped toilet? No   TIMED UP AND GO:  Was the test performed? No . Phone Visit  Cognitive Function:        11/21/2022   10:31 AM 10/29/2020   10:02 AM  6CIT Screen  What Year? 0 points 0 points  What month? 0 points 0 points  What time? 0 points 0 points  Count back from 20 0 points 0 points  Months in reverse 0 points 0 points  Repeat phrase 0 points 0 points  Total Score 0 points 0 points    Immunizations Immunization History  Administered Date(s) Administered   Fluad Quad(high Dose 65+) 12/31/2015   Influenza Split 09/20/2011, 07/13/2012, 12/31/2015   Influenza, High Dose Seasonal PF 08/07/2016, 08/04/2017, 07/21/2018, 07/22/2019   Influenza,inj,Quad PF,6+ Mos 10/04/2013   Influenza,inj,quad, With Preservative 10/04/2013   Influenza,trivalent, recombinat, inj, PF 09/20/2011, 07/13/2012, 12/31/2015   Influenza-Unspecified 09/20/2011, 07/13/2012, 10/04/2013, 08/07/2016   Pneumococcal Conjugate-13 07/15/2012, 05/11/2014   Pneumococcal Polysaccharide-23 07/13/2012   Pneumococcal-Unspecified 07/15/2012   Zoster, Live 07/29/2012    TDAP status: Due, Education has been provided regarding the importance of this vaccine. Advised may receive this vaccine at local pharmacy or Health Dept. Aware to provide a copy of the vaccination record if obtained from local pharmacy or Health Dept. Verbalized acceptance and understanding.  Flu Vaccine status: Declined, Education has been provided regarding the importance of this vaccine but patient still declined. Advised may receive this vaccine at local pharmacy or Health Dept. Aware to  provide a copy of the vaccination record if obtained from local pharmacy or Health Dept. Verbalized acceptance and understanding.  Pneumococcal vaccine status: Up to date  Covid-19 vaccine status: Declined, Education has been provided regarding the importance of this vaccine but patient still declined. Advised may receive this vaccine at local pharmacy or Health Dept.or vaccine clinic. Aware to provide a copy of the vaccination record if obtained from local pharmacy or Health Dept. Verbalized acceptance and understanding.  Qualifies for Shingles Vaccine? Yes   Zostavax completed Yes   Shingrix Completed?: No.    Education has been provided regarding the importance of this vaccine. Patient has been advised to call insurance company to determine out of pocket  expense if they have not yet received this vaccine. Advised may also receive vaccine at local pharmacy or Health Dept. Verbalized acceptance and understanding.  Screening Tests Health Maintenance  Topic Date Due   Hepatitis C Screening  Never done   Zoster Vaccines- Shingrix (1 of 2) Never done   Diabetic kidney evaluation - Urine ACR  06/21/2021   OPHTHALMOLOGY EXAM  12/06/2022 (Originally 04/18/2021)   INFLUENZA VACCINE  01/18/2023 (Originally 05/20/2022)   FOOT EXAM  02/08/2023   HEMOGLOBIN A1C  02/12/2023   Diabetic kidney evaluation - eGFR measurement  05/30/2023   Medicare Annual Wellness (AWV)  11/22/2023   Pneumonia Vaccine 73+ Years old  Completed   DEXA SCAN  Completed   HPV VACCINES  Aged Out   DTaP/Tdap/Td  Discontinued   COLONOSCOPY (Pts 45-20yr Insurance coverage will need to be confirmed)  Discontinued   COVID-19 Vaccine  Discontinued    Health Maintenance  Health Maintenance Due  Topic Date Due   Hepatitis C Screening  Never done   Zoster Vaccines- Shingrix (1 of 2) Never done   Diabetic kidney evaluation - Urine ACR  06/21/2021    Colorectal cancer screening: No longer required.   Mammogram status: Completed  03/13/2022. Repeat every year  Bone Density status: Ordered 11/21/2022. Pt provided with contact info and advised to call to schedule appt.  Lung Cancer Screening: (Low Dose CT Chest recommended if Age 80-80years, 30 pack-year currently smoking OR have quit w/in 15years.) does not qualify.   Lung Cancer Screening Referral: no  Additional Screening:  Hepatitis C Screening: does qualify; Completed no  Vision Screening: Recommended annual ophthalmology exams for early detection of glaucoma and other disorders of the eye. Is the patient up to date with their annual eye exam?  No  Who is the provider or what is the name of the office in which the patient attends annual eye exams? MGevena Cotton MD. If pt is not established with a provider, would they like to be referred to a provider to establish care? No .   Dental Screening: Recommended annual dental exams for proper oral hygiene  Community Resource Referral / Chronic Care Management: CRR required this visit?  No   CCM required this visit?  No      Plan:     I have personally reviewed and noted the following in the patient's chart:   Medical and social history Use of alcohol, tobacco or illicit drugs  Current medications and supplements including opioid prescriptions. Patient is not currently taking opioid prescriptions. Functional ability and status Nutritional status Physical activity Advanced directives List of other physicians Hospitalizations, surgeries, and ER visits in previous 12 months Vitals Screenings to include cognitive, depression, and falls Referrals and appointments  In addition, I have reviewed and discussed with patient certain preventive protocols, quality metrics, and best practice recommendations. A written personalized care plan for preventive services as well as general preventive health recommendations were provided to patient.     SSheral Flow [place]  24/03/5680  Nurse Notes: N/A

## 2022-11-27 ENCOUNTER — Encounter (HOSPITAL_COMMUNITY): Payer: Self-pay | Admitting: *Deleted

## 2022-11-27 NOTE — Progress Notes (Signed)
Depression screening completed: 11/21/2022, 11/26/2022 and 11/27/2022.  Patient stated that there was no issues with depression within the last 2 weeks.  Jaidyn Usery N. Taniela Feltus, LPN. [facility]  Site: [facility]

## 2022-11-28 ENCOUNTER — Other Ambulatory Visit: Payer: Self-pay | Admitting: Internal Medicine

## 2022-12-08 ENCOUNTER — Ambulatory Visit: Payer: Medicare Other | Admitting: Internal Medicine

## 2022-12-08 NOTE — Progress Notes (Deleted)
Name: [name]  Age/ Sex: 82 y.o., female   MRN/ DOB: [id number], December 10, 1940     PCP: Horald Pollen, MD   Reason for Endocrinology Evaluation: Type 2 Diabetes Mellitus  Initial Endocrine Consultative Visit: 06/21/2020    PATIENT IDENTIFIER: [name] is a 82 y.o. female with a past medical history of HTN, PAF, CHF, OSA and Dyslipidemia . The patient has followed with Endocrinology clinic since 06/21/2020 for consultative assistance with management of her diabetes.  DIABETIC HISTORY:  [name] was diagnosed with DM in 2011,she is intolerant to metformin due to diarrhea. Her hemoglobin A1c has ranged from 7.3% in 2011, peaking at 9.5% in 2014.   ADRENAL HISTORY: She was noted with an incidental finding of right adrenal adenoma ~ 4 cm on CT imaging and 09/2010 during evaluation of abdominal pain.  This has been stable on CT imaging of abdomen 03/2020  She had normal saliva cortisol 05/2021  She had normal normetanephrine and metanephrines 01/2022   Patient has primary hyperaldosteronism with an elevated Aldo/PRA ratio of 63.6, low renin activity at 0.11 (in the setting of ARB) and a normal level aldosterone of 7.  Would not proceed with invasive testing due to advanced age and cardiovascular history We have opted to treat medically with spironolactone which was started in May 2023  SUBJECTIVE:   During the last visit (08/13/2022): A1c 8.8 %     Today (12/08/2022): [name] is here for follow-up on diabetes management.  She is accompanied by her best friend.  She checks her blood sugars multiple times a day  times daily, through CGM . The patient has  had hypoglycemic episodes since the last clinic visit, she self reduced     She was seen by podiatry 09/03/2022  She is S/P cardioversion 10/18/2021 for A.Fib , so Dr. Caryl Comes 09/2022   HOME ENDOCRINE REGIMEN:  Humalog Mix 17 units with Breakfast and Supper Ozempic 1 mg weekly  Spironolactone 25 mg  daily        Statin: yes ACE-I/ARB: yes   CONTINUOUS GLUCOSE MONITORING RECORD INTERPRETATION    Dates of Recording: 10/12-10/25/2023  Sensor description:freestyle libre  Results statistics:   CGM use % of time 97  Average and SD 212/16.1  Time in range  16 %  % Time Above 180 70  % Time above 250 14  % Time Below target 0     Glycemic patterns summary: BG's high during the day and night   Hyperglycemic episodes  during the day   Hypoglycemic episodes occurred N/A  Overnight periods: high           DIABETIC COMPLICATIONS: Microvascular complications:  Neuropathy Denies: CKD, , retinopathy Last eye exam: Completed 2022   Macrovascular complications:    Denies: CAD, PVD, CVA    HISTORY:  Past Medical History:  Past Medical History:  Diagnosis Date   Arthritis    Back pain    Chronic anticoagulation    due to aflutter   Chronic kidney disease    Diabetes mellitus    Diastolic CHF, chronic ([place])    a.  echo 2006 - ef 55-65%; mild diast dysfxn;    b. Echo 08/2011: Mild LVH, EF 60%;  c. 04/2013 Echo: EF 65-69%, mild conc LVH;  08/2014 Echo: EF 60-65%, mild-mod MR.   Gout    Hyperlipidemia    Hypertension    a.  Renal arterial Dopplers 12/2011: 1-59% right renal artery stenosis   Morbid obesity ([place])  Obstructive sleep apnea on CPAP    Paroxysmal Afib/Flutter    a. 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   Past Surgical History:  Past Surgical History:  Procedure Laterality Date   APPENDECTOMY     ATRIAL FLUTTER ABLATION N/A 09/24/2011   Procedure: ATRIAL FLUTTER ABLATION;  Surgeon: [name], MD;  Location[facility] CATH LAB;  Service: Cardiovascular;  Laterality: N/A;   CARDIOVERSION  10/22/2011   Procedure: CARDIOVERSION;  Surgeon: [name], MD;  Location: [facility];  Service: Cardiovascular;  Laterality: N/A;   CARDIOVERSION N/A 09/10/2011   Procedure: CARDIOVERSION;  Surgeon: [name], MD;  Location: [facility] CATH LAB;  Service:  Cardiovascular;  Laterality: N/A;   CHOLECYSTECTOMY     COLONOSCOPY WITH PROPOFOL N/A 06/13/2021   Procedure: COLONOSCOPY WITH PROPOFOL;  Surgeon: Milus Banister, MD;  Location: [facility] ENDOSCOPY;  Service: Endoscopy;  Laterality: N/A;   POLYPECTOMY  06/13/2021   Procedure: POLYPECTOMY;  Surgeon: Milus Banister, MD;  Location: [facility] ENDOSCOPY;  Service: Endoscopy;;   TONSILLECTOMY  1982   TOTAL ABDOMINAL HYSTERECTOMY     Social History:  reports that she has never smoked. She has never used smokeless tobacco. She reports that she does not drink alcohol and does not use drugs. Family History:  Family History  Problem Relation Age of Onset   Heart disease Father    Hypertension Father    Breast cancer Sister    Cancer Sister        breast   Colon cancer Neg Hx    Esophageal cancer Neg Hx    Pancreatic cancer Neg Hx    Liver disease Neg Hx      HOME MEDICATIONS: Allergies as of 12/08/2022       Reactions   Doxycycline    Made patient feel very ill    Metformin Diarrhea        Medication List        Accurate as of December 08, 2022  7:26 AM. If you have any questions, ask your nurse or doctor.          Accu-Chek Softclix Lancets lancets 1 each by Other route 3 (three) times daily. as directed   allopurinol 300 MG tablet Commonly known as: ZYLOPRIM TAKE 1 TABLET BY MOUTH DAILY   azelastine 0.1 % nasal spray Commonly known as: ASTELIN 1-2 puffs each nostril at bedtime as needed   b complex vitamins capsule Take 1 capsule by mouth daily.   BD Pen Needle Nano 2nd Gen 32G X 4 MM Misc Generic drug: Insulin Pen Needle USE TWICE DAILY AS NEEDED   blood glucose meter kit and supplies Dispense based on patient and insurance preference. Use up to four times daily as directed. (FOR ICD-10 E10.9, E11.9).   Breztri Aerosphere 160-9-4.8 MCG/ACT Aero Generic drug: Budeson-Glycopyrrol-Formoterol USE 2 INHALATIONS BY MOUTH IN  THE MORNING AND AT BEDTIME   cholecalciferol 25  MCG (1000 UNIT) tablet Commonly known as: VITAMIN D3 Take 1,000 Units by mouth daily.   clobetasol cream 0.05 % Commonly known as: TEMOVATE Apply 1 Application topically 2 (two) times daily.   cloNIDine 0.3 MG tablet Commonly known as: CATAPRES TAKE 1 TABLET BY MOUTH TWICE  DAILY   cyanocobalamin 1000 MCG tablet Commonly known as: VITAMIN B12 Take 1,000 mcg by mouth daily.   dronedarone 400 MG tablet Commonly known as: MULTAQ Take 1 tablet (400 mg total) by mouth 2 (two) times daily.   Eliquis 5 MG Tabs tablet Generic drug:  apixaban TAKE 1 TABLET BY MOUTH TWICE  DAILY   FreeStyle Libre 14 Day Reader Kerrin Mo Use as directed.   FreeStyle Libre 2 Sensor Misc USE AS DIRECTED TO CHECK BLOOD  SUGAR DAILY   Gvoke HypoPen 2-Pack 0.5 MG/0.1ML Soaj Generic drug: Glucagon Inject 0.5 mg into the skin daily as needed. For Hypoglycemic events   HumaLOG Mix 75/25 KwikPen (75-25) 100 UNIT/ML Kwikpen Generic drug: Insulin Lispro Prot & Lispro INJECT SUBCUTANEOUSLY 25 UNITS  TWICE DAILY   HYDROcodone-acetaminophen 5-325 MG tablet Commonly known as: Norco Take 1 tablet by mouth every 6 (six) hours as needed for moderate pain.   hydrOXYzine 10 MG tablet Commonly known as: ATARAX Take 1 tablet (10 mg total) by mouth every 6 (six) hours as needed.   ipratropium 17 MCG/ACT inhaler Commonly known as: ATROVENT HFA Inhale 2 puffs into the lungs every 6 (six) hours as needed for wheezing.   ketorolac 0.5 % ophthalmic solution Commonly known as: ACULAR SMARTSIG:In Eye(s)   levothyroxine 100 MCG tablet Commonly known as: SYNTHROID Take 1 tablet (100 mcg total) by mouth daily.   lidocaine 5 % Commonly known as: Lidoderm Place 1 patch onto the skin daily. Remove & Discard patch within 12 hours or as directed by MD   losartan 100 MG tablet Commonly known as: COZAAR TAKE 1 TABLET BY MOUTH DAILY   metoprolol tartrate 25 MG tablet Commonly known as: LOPRESSOR Take 1 tablet (25 mg total)  by mouth 2 (two) times daily.   nystatin powder Commonly known as: nystatin Apply 1 Application topically 2 (two) times daily.   OneTouch Verio test strip Generic drug: glucose blood 1 each by Other route 3 (three) times daily.   Ozempic (2 MG/DOSE) 8 MG/3ML Sopn Generic drug: Semaglutide (2 MG/DOSE) Inject 2 mg into the skin once a week.   Restasis MultiDose 0.05 % ophthalmic emulsion Generic drug: cycloSPORINE Place 1 drop into both eyes 2 (two) times daily.   rosuvastatin 10 MG tablet Commonly known as: CRESTOR TAKE 1 TABLET BY MOUTH IN  THE EVENING   spironolactone 25 MG tablet Commonly known as: ALDACTONE TAKE 1 TABLET BY MOUTH DAILY   TobraDex ophthalmic ointment Generic drug: tobramycin-dexamethasone   torsemide 20 MG tablet Commonly known as: DEMADEX Take 1 tablet (20 mg total) by mouth daily.   triamcinolone cream 0.1 % Commonly known as: KENALOG Apply 1 application topically 2 (two) times daily.         OBJECTIVE:   Vital Signs: There were no vitals taken for this visit.   Wt Readings from Last 3 Encounters:  11/21/22 255 lb (115.7 kg)  10/29/22 255 lb (115.7 kg)  09/26/22 251 lb (113.9 kg)     Exam: General: Pt appears well and is in NAD, in a wheelchair  Lungs: Clear with good BS bilat   Heart: RRR  Extremities: Trace  pretibial edema.   Neuro: MS is good with appropriate affect, pt is alert and Ox3    DM foot exam: 08/2022 per podiatry     The skin of the feet is intact without sores or ulcerations. The pedal pulses are 2+ on right and 2+ on left. The sensation is intact to a screening 5.07, 10 gram monofilament bilaterally         DATA REVIEWED:  Lab Results  Component Value Date   HGBA1C 8.8 (A) 08/13/2022   HGBA1C 8.2 (A) 05/13/2022   HGBA1C 7.3 (A) 02/07/2022    Latest Reference Range & Units 05/29/22 11:19  COMPREHENSIVE METABOLIC PANEL  Rpt !  Sodium 135 - 145 mEq/L 133 (L)  Potassium 3.5 - 5.1 mEq/L 4.5  Chloride  96 - 112 mEq/L 95 (L)  CO2 19 - 32 mEq/L 31  Glucose 70 - 99 mg/dL 247 (H)  BUN 6 - 23 mg/dL 41 (H)  Creatinine 0.40 - 1.20 mg/dL 1.16  Calcium 8.4 - 10.5 mg/dL 9.1  Alkaline Phosphatase 39 - 117 U/L 110  Albumin 3.5 - 5.2 g/dL 3.8  AST 0 - 37 U/L 15  ALT 0 - 35 U/L 18  Total Protein 6.0 - 8.3 g/dL 7.5  Total Bilirubin 0.2 - 1.2 mg/dL 0.5  GFR >60.00 mL/min 45.12 (L)    ASSESSMENT / PLAN / RECOMMENDATIONS:   1) Type 2 Diabetes Mellitus, Poorly  controlled, With Neuropathic  complications - Most recent A1c of 8.8%. Goal A1c < 7.5 %.    -Patient with worsening glycemic control that she attributes to stress as her son is awaiting liver transplant -She assures me compliance with her insulin as well as her Ozempic -Patient denies any dietary indiscretions - Intolerant to Metformin  -She is on maximum dose of Ozempic -I will increase her insulin as below -Patient advised not to hold insulin if her Premeal BG is optimal  MEDICATIONS:  -He is Humalog Mix 20 units with Breakfast and 20 units with Supper  -Continue Ozempic 2 mg weekly    EDUCATION / INSTRUCTIONS: BG monitoring instructions: Patient is instructed to check her blood sugars 3 times a day, before meals . Call [facility] if: BG persistently < 70 I reviewed the Rule of 15 for the treatment of hypoglycemia in detail with the patient. Literature supplied.    2) Diabetic complications:  Eye: Does not have known diabetic retinopathy.  Neuro/ Feet: Does have known diabetic peripheral neuropathy .  Renal: Patient does not have known baseline CKD. She   is on an ACEI/ARB at present.   3) Right Adrenal Adenoma:   -She was diagnosed with right adrenal adenoma~4 cm on CT imaging from 2011, this has remained stable based on CT scan 01/01/2022 at 4.3 cm  -She had normal saliva cortisol testing 05/2021, metanephrines and normetanephrine's -She did have suppressed renin in the setting of aldosterone >5 at 7 and  elevated Aldo: Renin ratio    4) Primary Hyperaldosteronism:   -We have opted not to proceed with adrenal venous sampling no surgical intervention given advanced age and CHF and other cardiovascular history -Patient is  treated medically with spironolactone -She is off potassium  -She continues to be on torsemide through cardiology -I will check renin, and see if we need to increase spironolactone  Medication Continue spironolactone 25 mg daily     F/U in 6 months    Signed electronically by: Mack Guise, MD  [facility]  [facility] [address] [place], [place] [address] Phone: [phone] FAX: [phone]   CC: Horald Pollen, MD [place] [address] Phone: [phone]  Fax: [phone]  Return to Endocrinology clinic as below: Future Appointments  Date Time Provider [place]  12/08/2022  9:50 AM Natan Hartog, Melanie Crazier, MD [facility] None  12/30/2022 11:15 AM Marzetta Board, DPM [facility]  11/23/2023 10:00 AM [facility] Jeff ADVISOR [facility] None

## 2022-12-12 ENCOUNTER — Encounter: Payer: Self-pay | Admitting: Internal Medicine

## 2022-12-12 ENCOUNTER — Ambulatory Visit (INDEPENDENT_AMBULATORY_CARE_PROVIDER_SITE_OTHER): Payer: Medicare Other | Admitting: Internal Medicine

## 2022-12-12 VITALS — BP 130/70 | HR 100 | Ht 61.0 in | Wt 265.0 lb

## 2022-12-12 DIAGNOSIS — E1165 Type 2 diabetes mellitus with hyperglycemia: Secondary | ICD-10-CM

## 2022-12-12 DIAGNOSIS — Z794 Long term (current) use of insulin: Secondary | ICD-10-CM

## 2022-12-12 DIAGNOSIS — E1142 Type 2 diabetes mellitus with diabetic polyneuropathy: Secondary | ICD-10-CM | POA: Diagnosis not present

## 2022-12-12 DIAGNOSIS — E2609 Other primary hyperaldosteronism: Secondary | ICD-10-CM

## 2022-12-12 DIAGNOSIS — D3501 Benign neoplasm of right adrenal gland: Secondary | ICD-10-CM

## 2022-12-12 LAB — POCT GLYCOSYLATED HEMOGLOBIN (HGB A1C): Hemoglobin A1C: 8.9 % — AB (ref 4.0–5.6)

## 2022-12-12 MED ORDER — INSULIN LISPRO PROT & LISPRO (75-25 MIX) 100 UNIT/ML KWIKPEN: 26.0000 [IU] | PEN_INJECTOR | Freq: Two times a day (BID) | SUBCUTANEOUS | 2 refills | Status: DC

## 2022-12-12 MED ORDER — SPIRONOLACTONE 25 MG PO TABS: 25.0000 mg | ORAL_TABLET | Freq: Every day | ORAL | 3 refills | Status: DC

## 2022-12-12 MED ORDER — TIRZEPATIDE 5 MG/0.5ML ~~LOC~~ SOAJ: 5.0000 mg | SUBCUTANEOUS | 3 refills | Status: DC

## 2022-12-12 NOTE — Patient Instructions (Addendum)
-   Increase   Humalog Mix 26 units with Breakfast and 26 units with Supper  - Switch Ozempic to mounjaro 5 mg once weekly      HOW TO TREAT LOW BLOOD SUGARS (Blood sugar LESS THAN 70 MG/DL) Please follow the RULE OF 15 for the treatment of hypoglycemia treatment (when your (blood sugars are less than 70 mg/dL)   STEP 1: Take 15 grams of carbohydrates when your blood sugar is low, which includes:  3-4 GLUCOSE TABS  OR 3-4 OZ OF JUICE OR REGULAR SODA OR ONE TUBE OF GLUCOSE GEL    STEP 2: RECHECK blood sugar in 15 MINUTES STEP 3: If your blood sugar is still low at the 15 minute recheck --> then, go back to STEP 1 and treat AGAIN with another 15 grams of carbohydrates.

## 2022-12-12 NOTE — Progress Notes (Signed)
Name: [name]  Age/ Sex: 82 y.o., female   MRN/ DOB: [id number], May 23, 1941     PCP: Horald Pollen, MD   Reason for Endocrinology Evaluation: Type 2 Diabetes Mellitus  Initial Endocrine Consultative Visit: 06/21/2020    PATIENT IDENTIFIER: [name] is a 82 y.o. female with a past medical history of HTN, PAF, CHF, OSA and Dyslipidemia . The patient has followed with Endocrinology clinic since 06/21/2020 for consultative assistance with management of her diabetes.  DIABETIC HISTORY:  [name] was diagnosed with DM in 2011,she is intolerant to metformin due to diarrhea. Her hemoglobin A1c has ranged from 7.3% in 2011, peaking at 9.5% in 2014.   ADRENAL HISTORY: She was noted with an incidental finding of right adrenal adenoma ~ 4 cm on CT imaging and 09/2010 during evaluation of abdominal pain.  This has been stable on CT imaging of abdomen 03/2020  She had normal saliva cortisol 05/2021  She had normal normetanephrine and metanephrines 01/2022   Patient has primary hyperaldosteronism with an elevated Aldo/PRA ratio of 63.6, low renin activity at 0.11 (in the setting of ARB) and a normal level aldosterone of 7.  Would not proceed with invasive testing due to advanced age and cardiovascular history We have opted to treat medically with spironolactone which was started in May 2023  SUBJECTIVE:   During the last visit (08/13/2022): A1c 8.8 %    Today (12/12/2022): [name] is here for follow-up on diabetes management.  She is accompanied by her friend.  She checks her blood sugars multiple times a day  times daily, through CGM . The patient has  not had hypoglycemic episodes since the last clinic visit, she self reduced   She was seen by podiatry 09/03/2022  She is S/P cardioversion 10/18/2021 for A.Fib , saw Dr. Caryl Comes 09/2022 She denies any constipation, or diarrhea Denies nausea or vomiting She is upset about weight gain  HOME ENDOCRINE REGIMEN:   Humalog Mix 22 units with Breakfast and 22 units Supper Ozempic 2 mg weekly  Spironolactone 25 mg daily       Statin: yes ACE-I/ARB: yes   CONTINUOUS GLUCOSE MONITORING RECORD INTERPRETATION    Dates of Recording: 2/10-2/23/2024  Sensor description:freestyle libre  Results statistics:   CGM use % of time 96  Average and SD 199/19  Time in range 34%  % Time Above 180 55  % Time above 250 11  % Time Below target 0     Glycemic patterns summary: BG's high during the night but increase as the day progresses Hyperglycemic episodes during the day and night but worse during the day  Hypoglycemic episodes occurred N/A  Overnight periods: high           DIABETIC COMPLICATIONS: Microvascular complications:  Neuropathy Denies: CKD, , retinopathy Last eye exam: Completed 2022   Macrovascular complications:    Denies: CAD, PVD, CVA    HISTORY:  Past Medical History:  Past Medical History:  Diagnosis Date   Arthritis    Back pain    Chronic anticoagulation    due to aflutter   Chronic kidney disease    Diabetes mellitus    Diastolic CHF, chronic ([place])    a.  echo 2006 - ef 55-65%; mild diast dysfxn;    b. Echo 08/2011: Mild LVH, EF 60%;  c. 04/2013 Echo: EF 65-69%, mild conc LVH;  08/2014 Echo: EF 60-65%, mild-mod MR.   Gout    Hyperlipidemia    Hypertension  a.  Renal arterial Dopplers 12/2011: 1-59% right renal artery stenosis   Morbid obesity ([place])    Obstructive sleep apnea on CPAP    Paroxysmal Afib/Flutter    a. 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   Past Surgical History:  Past Surgical History:  Procedure Laterality Date   APPENDECTOMY     ATRIAL FLUTTER ABLATION N/A 09/24/2011   Procedure: ATRIAL FLUTTER ABLATION;  Surgeon: [name], MD;  Location: [facility] CATH LAB;  Service: Cardiovascular;  Laterality: N/A;   CARDIOVERSION  10/22/2011   Procedure: CARDIOVERSION;  Surgeon: [name], MD;  Location: [facility];  Service: Cardiovascular;   Laterality: N/A;   CARDIOVERSION N/A 09/10/2011   Procedure: CARDIOVERSION;  Surgeon: [name], MD;  Location: [facility] CATH LAB;  Service: Cardiovascular;  Laterality: N/A;   CHOLECYSTECTOMY     COLONOSCOPY WITH PROPOFOL N/A 06/13/2021   Procedure: COLONOSCOPY WITH PROPOFOL;  Surgeon: Milus Banister, MD;  Location: [facility] ENDOSCOPY;  Service: Endoscopy;  Laterality: N/A;   POLYPECTOMY  06/13/2021   Procedure: POLYPECTOMY;  Surgeon: Milus Banister, MD;  Location: [facility] ENDOSCOPY;  Service: Endoscopy;;   TONSILLECTOMY  1982   TOTAL ABDOMINAL HYSTERECTOMY     Social History:  reports that she has never smoked. She has never used smokeless tobacco. She reports that she does not drink alcohol and does not use drugs. Family History:  Family History  Problem Relation Age of Onset   Heart disease Father    Hypertension Father    Breast cancer Sister    Cancer Sister        breast   Colon cancer Neg Hx    Esophageal cancer Neg Hx    Pancreatic cancer Neg Hx    Liver disease Neg Hx      HOME MEDICATIONS: Allergies as of 12/12/2022       Reactions   Doxycycline    Made patient feel very ill    Metformin Diarrhea        Medication List        Accurate as of December 12, 2022  4:22 PM. If you have any questions, ask your nurse or doctor.          Accu-Chek Softclix Lancets lancets 1 each by Other route 3 (three) times daily. as directed   allopurinol 300 MG tablet Commonly known as: ZYLOPRIM TAKE 1 TABLET BY MOUTH DAILY   azelastine 0.1 % nasal spray Commonly known as: ASTELIN 1-2 puffs each nostril at bedtime as needed   b complex vitamins capsule Take 1 capsule by mouth daily.   BD Pen Needle Nano 2nd Gen 32G X 4 MM Misc Generic drug: Insulin Pen Needle USE TWICE DAILY AS NEEDED   blood glucose meter kit and supplies Dispense based on patient and insurance preference. Use up to four times daily as directed. (FOR ICD-10 E10.9, E11.9).   Breztri Aerosphere 160-9-4.8  MCG/ACT Aero Generic drug: Budeson-Glycopyrrol-Formoterol USE 2 INHALATIONS BY MOUTH IN  THE MORNING AND AT BEDTIME   cholecalciferol 25 MCG (1000 UNIT) tablet Commonly known as: VITAMIN D3 Take 1,000 Units by mouth daily.   clobetasol cream 0.05 % Commonly known as: TEMOVATE Apply 1 Application topically 2 (two) times daily.   cloNIDine 0.3 MG tablet Commonly known as: CATAPRES TAKE 1 TABLET BY MOUTH TWICE  DAILY   cyanocobalamin 1000 MCG tablet Commonly known as: VITAMIN B12 Take 1,000 mcg by mouth daily.   dronedarone 400 MG tablet Commonly known as: MULTAQ Take 1  tablet (400 mg total) by mouth 2 (two) times daily.   Eliquis 5 MG Tabs tablet Generic drug: apixaban TAKE 1 TABLET BY MOUTH TWICE  DAILY   FreeStyle Libre 14 Day Reader Kerrin Mo Use as directed.   FreeStyle Libre 2 Sensor Misc USE AS DIRECTED TO CHECK BLOOD  SUGAR DAILY   Gvoke HypoPen 2-Pack 0.5 MG/0.1ML Soaj Generic drug: Glucagon Inject 0.5 mg into the skin daily as needed. For Hypoglycemic events   HumaLOG Mix 75/25 KwikPen (75-25) 100 UNIT/ML Kwikpen Generic drug: Insulin Lispro Prot & Lispro INJECT SUBCUTANEOUSLY 25 UNITS  TWICE DAILY   HYDROcodone-acetaminophen 5-325 MG tablet Commonly known as: Norco Take 1 tablet by mouth every 6 (six) hours as needed for moderate pain.   hydrOXYzine 10 MG tablet Commonly known as: ATARAX Take 1 tablet (10 mg total) by mouth every 6 (six) hours as needed.   ipratropium 17 MCG/ACT inhaler Commonly known as: ATROVENT HFA Inhale 2 puffs into the lungs every 6 (six) hours as needed for wheezing.   ketorolac 0.5 % ophthalmic solution Commonly known as: ACULAR SMARTSIG:In Eye(s)   levothyroxine 100 MCG tablet Commonly known as: SYNTHROID Take 1 tablet (100 mcg total) by mouth daily.   lidocaine 5 % Commonly known as: Lidoderm Place 1 patch onto the skin daily. Remove & Discard patch within 12 hours or as directed by MD   losartan 100 MG tablet Commonly  known as: COZAAR TAKE 1 TABLET BY MOUTH DAILY   metoprolol tartrate 25 MG tablet Commonly known as: LOPRESSOR Take 1 tablet (25 mg total) by mouth 2 (two) times daily.   nystatin powder Commonly known as: nystatin Apply 1 Application topically 2 (two) times daily.   OneTouch Verio test strip Generic drug: glucose blood 1 each by Other route 3 (three) times daily.   Ozempic (2 MG/DOSE) 8 MG/3ML Sopn Generic drug: Semaglutide (2 MG/DOSE) Inject 2 mg into the skin once a week.   Restasis MultiDose 0.05 % ophthalmic emulsion Generic drug: cycloSPORINE Place 1 drop into both eyes 2 (two) times daily.   rosuvastatin 10 MG tablet Commonly known as: CRESTOR TAKE 1 TABLET BY MOUTH IN  THE EVENING   spironolactone 25 MG tablet Commonly known as: ALDACTONE TAKE 1 TABLET BY MOUTH DAILY   tirzepatide 5 MG/0.5ML Pen Commonly known as: MOUNJARO Inject 5 mg into the skin once a week. Started by: Dorita Sciara, MD   TobraDex ophthalmic ointment Generic drug: tobramycin-dexamethasone   torsemide 20 MG tablet Commonly known as: DEMADEX Take 1 tablet (20 mg total) by mouth daily.   triamcinolone cream 0.1 % Commonly known as: KENALOG Apply 1 application topically 2 (two) times daily.         OBJECTIVE:   Vital Signs: BP 130/70 (BP Location: Left Arm, Patient Position: Sitting, Cuff Size: Large)   Pulse 100   Ht '5\' 1"'$  (1.549 m)   Wt 265 lb (120.2 kg)   SpO2 98%   BMI 50.07 kg/m    Wt Readings from Last 3 Encounters:  12/12/22 265 lb (120.2 kg)  11/21/22 255 lb (115.7 kg)  10/29/22 255 lb (115.7 kg)     Exam: General: Pt appears well and is in NAD, in a wheelchair  Lungs: Clear with good BS bilat   Heart: RRR  Extremities: Trace  pretibial edema.   Neuro: MS is good with appropriate affect, pt is alert and Ox3    DM foot exam: 08/2022 per podiatry     The skin of  the feet is intact without sores or ulcerations. The pedal pulses are 2+ on right and 2+  on left. The sensation is intact to a screening 5.07, 10 gram monofilament bilaterally         DATA REVIEWED:  Lab Results  Component Value Date   HGBA1C 8.9 (A) 12/12/2022   HGBA1C 8.8 (A) 08/13/2022   HGBA1C 8.2 (A) 05/13/2022    Latest Reference Range & Units 05/29/22 11:19  COMPREHENSIVE METABOLIC PANEL  Rpt !  Sodium 135 - 145 mEq/L 133 (L)  Potassium 3.5 - 5.1 mEq/L 4.5  Chloride 96 - 112 mEq/L 95 (L)  CO2 19 - 32 mEq/L 31  Glucose 70 - 99 mg/dL 247 (H)  BUN 6 - 23 mg/dL 41 (H)  Creatinine 0.40 - 1.20 mg/dL 1.16  Calcium 8.4 - 10.5 mg/dL 9.1  Alkaline Phosphatase 39 - 117 U/L 110  Albumin 3.5 - 5.2 g/dL 3.8  AST 0 - 37 U/L 15  ALT 0 - 35 U/L 18  Total Protein 6.0 - 8.3 g/dL 7.5  Total Bilirubin 0.2 - 1.2 mg/dL 0.5  GFR >60.00 mL/min 45.12 (L)    ASSESSMENT / PLAN / RECOMMENDATIONS:   1) Type 2 Diabetes Mellitus, Poorly  controlled, With Neuropathic  complications - Most recent A1c of 8.9%. Goal A1c < 7.5 %.    -Patient with worsening glycemic control  - Will switch Ozempic to mounjaro, provided with # 4 pen samples  - Intolerant to Metformin  -I will increase her insulin as below -She was referred to our CDE for further education regarding low-carb diet  MEDICATIONS:  -Increase Humalog Mix 26 units with Breakfast and 26 units with Supper  -Switch Ozempic to Mounjaro 5 mg weekly   EDUCATION / INSTRUCTIONS: BG monitoring instructions: Patient is instructed to check her blood sugars 3 times a day, before meals . Call [facility] if: BG persistently < 70 I reviewed the Rule of 15 for the treatment of hypoglycemia in detail with the patient. Literature supplied.    2) Diabetic complications:  Eye: Does not have known diabetic retinopathy.  Neuro/ Feet: Does have known diabetic peripheral neuropathy .  Renal: Patient does not have known baseline CKD. She   is on an ACEI/ARB at present.   3) Right Adrenal Adenoma:   -She was diagnosed  with right adrenal adenoma~4 cm on CT imaging from 2011, this has remained stable based on CT scan 01/01/2022 at 4.3 cm  -She had normal saliva cortisol testing 05/2021, metanephrines and normetanephrine's -She did have suppressed renin in the setting of aldosterone >5 at 7 and elevated Aldo: Renin ratio     4) Primary Hyperaldosteronism:   -We have opted not to proceed with adrenal venous sampling no surgical intervention given advanced age and CHF and other cardiovascular history -Renin has been normal on current dose of spironolactone -BP controlled  Medication Continue spironolactone 25 mg daily     F/U in 6 months    Signed electronically by: Mack Guise, MD   [facility]  [facility] [address] [place], [place] [address] Phone: [phone] FAX: [phone]   CC: Horald Pollen, MD [place] [address] Phone: [phone]  Fax: [phone]  Return to Endocrinology clinic as below: Future Appointments  Date Time Provider [place]  12/30/2022 11:15 AM Marzetta Board, DPM [facility]  06/04/2023 11:30 AM Vaida Kerchner, Melanie Crazier, MD [facility] None  11/23/2023 10:00 AM [facility]  ADVISOR [facility] None

## 2022-12-15 ENCOUNTER — Encounter: Payer: Self-pay | Admitting: Internal Medicine

## 2022-12-30 ENCOUNTER — Encounter: Payer: Self-pay | Admitting: Emergency Medicine

## 2022-12-30 ENCOUNTER — Ambulatory Visit (INDEPENDENT_AMBULATORY_CARE_PROVIDER_SITE_OTHER): Payer: Medicare Other | Admitting: Emergency Medicine

## 2022-12-30 ENCOUNTER — Ambulatory Visit (INDEPENDENT_AMBULATORY_CARE_PROVIDER_SITE_OTHER): Payer: Medicare Other | Admitting: Podiatry

## 2022-12-30 DIAGNOSIS — M79675 Pain in left toe(s): Secondary | ICD-10-CM

## 2022-12-30 DIAGNOSIS — I152 Hypertension secondary to endocrine disorders: Secondary | ICD-10-CM | POA: Diagnosis not present

## 2022-12-30 DIAGNOSIS — E785 Hyperlipidemia, unspecified: Secondary | ICD-10-CM

## 2022-12-30 DIAGNOSIS — E1142 Type 2 diabetes mellitus with diabetic polyneuropathy: Secondary | ICD-10-CM | POA: Diagnosis not present

## 2022-12-30 DIAGNOSIS — E1169 Type 2 diabetes mellitus with other specified complication: Secondary | ICD-10-CM

## 2022-12-30 DIAGNOSIS — G4733 Obstructive sleep apnea (adult) (pediatric): Secondary | ICD-10-CM

## 2022-12-30 DIAGNOSIS — I480 Paroxysmal atrial fibrillation: Secondary | ICD-10-CM

## 2022-12-30 DIAGNOSIS — I5042 Chronic combined systolic (congestive) and diastolic (congestive) heart failure: Secondary | ICD-10-CM | POA: Diagnosis not present

## 2022-12-30 DIAGNOSIS — B351 Tinea unguium: Secondary | ICD-10-CM | POA: Diagnosis not present

## 2022-12-30 DIAGNOSIS — M79674 Pain in right toe(s): Secondary | ICD-10-CM | POA: Diagnosis not present

## 2022-12-30 DIAGNOSIS — E1159 Type 2 diabetes mellitus with other circulatory complications: Secondary | ICD-10-CM | POA: Diagnosis not present

## 2022-12-30 DIAGNOSIS — E039 Hypothyroidism, unspecified: Secondary | ICD-10-CM | POA: Diagnosis not present

## 2022-12-30 DIAGNOSIS — Z794 Long term (current) use of insulin: Secondary | ICD-10-CM

## 2022-12-30 LAB — COMPREHENSIVE METABOLIC PANEL
ALT: 14 U/L (ref 0–35)
AST: 15 U/L (ref 0–37)
Albumin: 3.9 g/dL (ref 3.5–5.2)
Alkaline Phosphatase: 104 U/L (ref 39–117)
BUN: 52 mg/dL — ABNORMAL HIGH (ref 6–23)
CO2: 23 mEq/L (ref 19–32)
Calcium: 9.5 mg/dL (ref 8.4–10.5)
Chloride: 99 mEq/L (ref 96–112)
Creatinine, Ser: 1.59 mg/dL — ABNORMAL HIGH (ref 0.40–1.20)
GFR: 30.78 mL/min — ABNORMAL LOW (ref >60.00)
Glucose, Bld: 169 mg/dL — ABNORMAL HIGH (ref 70–99)
Potassium: 5.6 mEq/L — ABNORMAL HIGH (ref 3.5–5.1)
Sodium: 132 mEq/L — ABNORMAL LOW (ref 135–145)
Total Bilirubin: 0.6 mg/dL (ref 0.2–1.2)
Total Protein: 7.6 g/dL (ref 6.0–8.3)

## 2022-12-30 LAB — CBC WITH DIFFERENTIAL/PLATELET
Basophils Absolute: 0 10*3/uL (ref 0.0–0.1)
Basophils Relative: 0.3 % (ref 0.0–3.0)
Eosinophils Absolute: 0.7 10*3/uL (ref 0.0–0.7)
Eosinophils Relative: 4.5 % (ref 0.0–5.0)
HCT: 35.4 % — ABNORMAL LOW (ref 36.0–46.0)
Hemoglobin: 11.3 g/dL — ABNORMAL LOW (ref 12.0–15.0)
Lymphocytes Relative: 19.7 % (ref 12.0–46.0)
Lymphs Abs: 3.1 10*3/uL (ref 0.7–4.0)
MCHC: 31.9 g/dL (ref 30.0–36.0)
MCV: 87.4 fl (ref 78.0–100.0)
Monocytes Absolute: 1.5 10*3/uL — ABNORMAL HIGH (ref 0.1–1.0)
Monocytes Relative: 9.4 % (ref 3.0–12.0)
Neutro Abs: 10.5 10*3/uL — ABNORMAL HIGH (ref 1.4–7.7)
Neutrophils Relative %: 66.1 % (ref 43.0–77.0)
Platelets: 347 10*3/uL (ref 150.0–400.0)
RBC: 4.05 Mil/uL (ref 3.87–5.11)
RDW: 17.6 % — ABNORMAL HIGH (ref 11.5–15.5)
WBC: 15.9 10*3/uL — ABNORMAL HIGH (ref 4.0–10.5)

## 2022-12-30 LAB — TSH: TSH: 9.87 u[IU]/mL — ABNORMAL HIGH (ref 0.35–5.50)

## 2022-12-30 NOTE — Assessment & Plan Note (Signed)
Diet and nutrition discussed. Advised to decrease amount of daily carbohydrate intake and daily calories and increase amount of plant-based protein in her diet. Has appointment with medical weight management later this month.

## 2022-12-30 NOTE — Assessment & Plan Note (Signed)
Clinically euthyroid. TSH done today. Continue Synthroid 100 mcg daily. 

## 2022-12-30 NOTE — Assessment & Plan Note (Signed)
Stable.  Diet and nutrition discussed.  Continue rosuvastatin 10 mg daily.  

## 2022-12-30 NOTE — Assessment & Plan Note (Signed)
Stable.  Continues on CPAP treatment.

## 2022-12-30 NOTE — Assessment & Plan Note (Signed)
Well-controlled hypertension. BP Readings from Last 3 Encounters:  12/30/22 120/74  12/12/22 130/70  10/29/22 120/72  Continue losartan 100 mg daily, metoprolol tartrate 25 mg twice a day, clonidine 0.3 mg twice a day, Aldactone 25 mg Hemoglobin A1c still not at goal Lab Results  Component Value Date   HGBA1C 8.9 (A) 12/12/2022  Sees endocrinologist on a regular basis. Continues on Humalog insulin 75/25 26 units twice a day Recently started on Mounjaro 5 mg weekly Cardiovascular risks associated with hypertension and diabetes discussed Diet and nutrition discussed.

## 2022-12-30 NOTE — Patient Instructions (Signed)
Calorie Counting for Weight Loss Calories are units of energy. Your body needs a certain number of calories from food to keep going throughout the day. When you eat or drink more calories than your body needs, your body stores the extra calories mostly as fat. When you eat or drink fewer calories than your body needs, your body burns fat to get the energy it needs. Calorie counting means keeping track of how many calories you eat and drink each day. Calorie counting can be helpful if you need to lose weight. If you eat fewer calories than your body needs, you should lose weight. Ask your health care provider what a healthy weight is for you. For calorie counting to work, you will need to eat the right number of calories each day to lose a healthy amount of weight per week. A dietitian can help you figure out how many calories you need in a day and will suggest ways to reach your calorie goal. A healthy amount of weight to lose each week is usually 1-2 lb (0.5-0.9 kg). This usually means that your daily calorie intake should be reduced by 500-750 calories. Eating 1,200-1,500 calories a day can help most women lose weight. Eating 1,500-1,800 calories a day can help most men lose weight. What do I need to know about calorie counting? Work with your health care provider or dietitian to determine how many calories you should get each day. To meet your daily calorie goal, you will need to: Find out how many calories are in each food that you would like to eat. Try to do this before you eat. Decide how much of the food you plan to eat. Keep a food log. Do this by writing down what you ate and how many calories it had. To successfully lose weight, it is important to balance calorie counting with a healthy lifestyle that includes regular activity. Where do I find calorie information?  The number of calories in a food can be found on a Nutrition Facts label. If a food does not have a Nutrition Facts label, try  to look up the calories online or ask your dietitian for help. Remember that calories are listed per serving. If you choose to have more than one serving of a food, you will have to multiply the calories per serving by the number of servings you plan to eat. For example, the label on a package of bread might say that a serving size is 1 slice and that there are 90 calories in a serving. If you eat 1 slice, you will have eaten 90 calories. If you eat 2 slices, you will have eaten 180 calories. How do I keep a food log? After each time that you eat, record the following in your food log as soon as possible: What you ate. Be sure to include toppings, sauces, and other extras on the food. How much you ate. This can be measured in cups, ounces, or number of items. How many calories were in each food and drink. The total number of calories in the food you ate. Keep your food log near you, such as in a pocket-sized notebook or on an app or website on your mobile phone. Some programs will calculate calories for you and show you how many calories you have left to meet your daily goal. What are some portion-control tips? Know how many calories are in a serving. This will help you know how many servings you can have of a certain   food. Use a measuring cup to measure serving sizes. You could also try weighing out portions on a kitchen scale. With time, you will be able to estimate serving sizes for some foods. Take time to put servings of different foods on your favorite plates or in your favorite bowls and cups so you know what a serving looks like. Try not to eat straight from a food's packaging, such as from a bag or box. Eating straight from the package makes it hard to see how much you are eating and can lead to overeating. Put the amount you would like to eat in a cup or on a plate to make sure you are eating the right portion. Use smaller plates, glasses, and bowls for smaller portions and to prevent  overeating. Try not to multitask. For example, avoid watching TV or using your computer while eating. If it is time to eat, sit down at a table and enjoy your food. This will help you recognize when you are full. It will also help you be more mindful of what and how much you are eating. What are tips for following this plan? Reading food labels Check the calorie count compared with the serving size. The serving size may be smaller than what you are used to eating. Check the source of the calories. Try to choose foods that are high in protein, fiber, and vitamins, and low in saturated fat, trans fat, and sodium. Shopping Read nutrition labels while you shop. This will help you make healthy decisions about which foods to buy. Pay attention to nutrition labels for low-fat or fat-free foods. These foods sometimes have the same number of calories or more calories than the full-fat versions. They also often have added sugar, starch, or salt to make up for flavor that was removed with the fat. Make a grocery list of lower-calorie foods and stick to it. Cooking Try to cook your favorite foods in a healthier way. For example, try baking instead of frying. Use low-fat dairy products. Meal planning Use more fruits and vegetables. One-half of your plate should be fruits and vegetables. Include lean proteins, such as chicken, turkey, and fish. Lifestyle Each week, aim to do one of the following: 150 minutes of moderate exercise, such as walking. 75 minutes of vigorous exercise, such as running. General information Know how many calories are in the foods you eat most often. This will help you calculate calorie counts faster. Find a way of tracking calories that works for you. Get creative. Try different apps or programs if writing down calories does not work for you. What foods should I eat?  Eat nutritious foods. It is better to have a nutritious, high-calorie food, such as an avocado, than a food with  few nutrients, such as a bag of potato chips. Use your calories on foods and drinks that will fill you up and will not leave you hungry soon after eating. Examples of foods that fill you up are nuts and nut butters, vegetables, lean proteins, and high-fiber foods such as whole grains. High-fiber foods are foods with more than 5 g of fiber per serving. Pay attention to calories in drinks. Low-calorie drinks include water and unsweetened drinks. The items listed above may not be a complete list of foods and beverages you can eat. Contact a dietitian for more information. What foods should I limit? Limit foods or drinks that are not good sources of vitamins, minerals, or protein or that are high in unhealthy fats. These   include: Candy. Other sweets. Sodas, specialty coffee drinks, alcohol, and juice. The items listed above may not be a complete list of foods and beverages you should avoid. Contact a dietitian for more information. How do I count calories when eating out? Pay attention to portions. Often, portions are much larger when eating out. Try these tips to keep portions smaller: Consider sharing a meal instead of getting your own. If you get your own meal, eat only half of it. Before you start eating, ask for a container and put half of your meal into it. When available, consider ordering smaller portions from the menu instead of full portions. Pay attention to your food and drink choices. Knowing the way food is cooked and what is included with the meal can help you eat fewer calories. If calories are listed on the menu, choose the lower-calorie options. Choose dishes that include vegetables, fruits, whole grains, low-fat dairy products, and lean proteins. Choose items that are boiled, broiled, grilled, or steamed. Avoid items that are buttered, battered, fried, or served with cream sauce. Items labeled as crispy are usually fried, unless stated otherwise. Choose water, low-fat milk,  unsweetened iced tea, or other drinks without added sugar. If you want an alcoholic beverage, choose a lower-calorie option, such as a glass of wine or light beer. Ask for dressings, sauces, and syrups on the side. These are usually high in calories, so you should limit the amount you eat. If you want a salad, choose a garden salad and ask for grilled meats. Avoid extra toppings such as bacon, cheese, or fried items. Ask for the dressing on the side, or ask for olive oil and vinegar or lemon to use as dressing. Estimate how many servings of a food you are given. Knowing serving sizes will help you be aware of how much food you are eating at restaurants. Where to find more information Centers for Disease Control and Prevention: www.cdc.gov U.[place]. Department of Agriculture: myplate.gov Summary Calorie counting means keeping track of how many calories you eat and drink each day. If you eat fewer calories than your body needs, you should lose weight. A healthy amount of weight to lose per week is usually 1-2 lb (0.5-0.9 kg). This usually means reducing your daily calorie intake by 500-750 calories. The number of calories in a food can be found on a Nutrition Facts label. If a food does not have a Nutrition Facts label, try to look up the calories online or ask your dietitian for help. Use smaller plates, glasses, and bowls for smaller portions and to prevent overeating. Use your calories on foods and drinks that will fill you up and not leave you hungry shortly after a meal. This information is not intended to replace advice given to you by your health care provider. Make sure you discuss any questions you have with your health care provider. Document Revised: 11/17/2019 Document Reviewed: 11/17/2019 Elsevier Patient Education  2023 Elsevier Inc.  

## 2022-12-30 NOTE — Assessment & Plan Note (Signed)
Stable.  No signs or symptoms of acute CHF. Continues diuretic treatment with Demadex 20 mg daily and Aldactone 25 mg daily.  Also on losartan 100 mg daily and metoprolol tartrate 25 mg twice a day. No signs of volume overload.  No peripheral edema.  Clear lungs.  Normotensive.

## 2022-12-30 NOTE — Progress Notes (Signed)
Lab Results  Component Value Date   HGBA1C 8.9 (A) 12/12/2022   Wt Readings from Last 3 Encounters:  12/12/22 265 lb (120.2 kg)  11/21/22 255 lb (115.7 kg)  10/29/22 255 lb (115.7 kg)   BP Readings from Last 3 Encounters:  12/12/22 130/70  10/29/22 120/72  09/26/22 114/66   Lab Results  Component Value Date   TSH 0.84 05/29/2022   [name] 81 y.o.   Chief Complaint  Patient presents with   Follow-up    F/u appt, weight gain issues     HISTORY OF PRESENT ILLNESS: This is a 82 y.o. female complaining of difficulty losing weight. Diabetic.  Sees endocrinologist on a regular basis.  On insulin 25 units twice a day.  Recently started on Mounjaro.  Started 2 weeks ago. No other complaints or medical concerns today.  HPI   Prior to Admission medications   Medication Sig Start Date End Date Taking? Authorizing Provider  Accu-Chek Softclix Lancets lancets 1 each by Other route 3 (three) times daily. as directed 03/30/20  Yes Maximiano Coss, NP  allopurinol (ZYLOPRIM) 300 MG tablet TAKE 1 TABLET BY MOUTH DAILY 11/13/22  Yes Jadyn Brasher, Ines Bloomer, MD  apixaban (ELIQUIS) 5 MG TABS tablet TAKE 1 TABLET BY MOUTH TWICE  DAILY 11/10/22  Yes [name], MD  azelastine (ASTELIN) 0.1 % nasal spray 1-2 puffs each nostril at bedtime as needed 01/02/22  Yes Young, Tarri Fuller D, MD  b complex vitamins capsule Take 1 capsule by mouth daily.   Yes [provider]  BD PEN NEEDLE NANO 2ND GEN 32G X 4 MM MISC USE TWICE DAILY AS NEEDED 08/02/22  Yes Alexsander Cavins, Ines Bloomer, MD  blood glucose meter kit and supplies Dispense based on patient and insurance preference. Use up to four times daily as directed. (FOR ICD-10 E10.9, E11.9). 08/29/21  Yes Braydn Carneiro, Ines Bloomer, MD  BREZTRI AEROSPHERE 160-9-4.8 MCG/ACT AERO USE 2 INHALATIONS BY MOUTH IN  THE MORNING AND AT BEDTIME 11/12/21  Yes Cobb, Karie Schwalbe, NP  cholecalciferol (VITAMIN D3) 25 MCG (1000 UNIT) tablet Take 1,000 Units by mouth  daily.   Yes [provider]  clobetasol cream (TEMOVATE) [id number] % Apply 1 Application topically 2 (two) times daily. 06/05/22  Yes Jaeleigh Monaco, Ines Bloomer, MD  cloNIDine (CATAPRES) 0.3 MG tablet TAKE 1 TABLET BY MOUTH TWICE  DAILY 11/06/22  Yes Dellamae Rosamilia, Ines Bloomer, MD  Continuous Blood Gluc Receiver (FREESTYLE LIBRE 14 DAY READER) DEVI Use as directed. 08/29/21  Yes Matvey Llanas, Ines Bloomer, MD  Continuous Blood Gluc Sensor (FREESTYLE LIBRE 2 SENSOR) MISC USE AS DIRECTED TO CHECK BLOOD  SUGAR DAILY 04/06/22  Yes Horald Pollen, MD  dronedarone (MULTAQ) 400 MG tablet Take 1 tablet (400 mg total) by mouth 2 (two) times daily. 12/19/21  Yes [name], MD  Glucagon (GVOKE HYPOPEN 2-PACK) 0.5 MG/0.1ML SOAJ Inject 0.5 mg into the skin daily as needed. For Hypoglycemic events 05/30/22  Yes Takyla Kuchera, Ines Bloomer, MD  HYDROcodone-acetaminophen [facility]) 5-325 MG tablet Take 1 tablet by mouth every 6 (six) hours as needed for moderate pain. 08/12/22  Yes Meigan Pates, Ines Bloomer, MD  hydrOXYzine (ATARAX) 10 MG tablet Take 1 tablet (10 mg total) by mouth every 6 (six) hours as needed. 08/12/22  Yes Breeanna Galgano, Ines Bloomer, MD  Insulin Lispro Prot & Lispro (HUMALOG MIX 75/25 KWIKPEN) (75-25) 100 UNIT/ML Kwikpen Inject 26 Units into the skin 2 (two) times daily before a meal. 12/12/22  Yes [name], Melanie Crazier, MD  ketorolac (ACULAR) 0.5 % ophthalmic solution SMARTSIG:In Eye(s) 08/22/21  Yes [provider]  levothyroxine (SYNTHROID) 100 MCG tablet Take 1 tablet (100 mcg total) by mouth daily. 03/18/22  Yes Lainy Wrobleski, Ines Bloomer, MD  lidocaine (LIDODERM) 5 % Place 1 patch onto the skin daily. Remove & Discard patch within 12 hours or as directed by MD 08/29/20  Yes Mcarthur Rossetti, MD  losartan (COZAAR) 100 MG tablet TAKE 1 TABLET BY MOUTH DAILY 10/13/22  Yes Iris Tatsch, Ines Bloomer, MD  metoprolol tartrate (LOPRESSOR) 25 MG tablet Take 1 tablet (25 mg total) by mouth 2 (two) times daily.  10/29/22 10/24/23 Yes Sharnelle Cappelli, Ines Bloomer, MD  nystatin powder Apply 1 Application topically 2 (two) times daily. 08/12/22  Yes Latressa Harries, Ines Bloomer, MD  [facility] VERIO test strip 1 each by Other route 3 (three) times daily. 06/08/20  Yes [provider]  RESTASIS MULTIDOSE 0.05 % ophthalmic emulsion Place 1 drop into both eyes 2 (two) times daily. 03/04/20  Yes [provider]  rosuvastatin (CRESTOR) 10 MG tablet TAKE 1 TABLET BY MOUTH IN  THE EVENING 04/03/22  Yes Zakhari Fogel, Ines Bloomer, MD  spironolactone (ALDACTONE) 25 MG tablet Take 1 tablet (25 mg total) by mouth daily. 12/12/22  Yes [name], Melanie Crazier, MD  tirzepatide [facility]) 5 MG/0.5ML Pen Inject 5 mg into the skin once a week. 12/12/22  Yes [name], Melanie Crazier, MD  TOBRADEX ophthalmic ointment  08/25/21  Yes [provider]  torsemide (DEMADEX) 20 MG tablet Take 1 tablet (20 mg total) by mouth daily. 05/30/21  Yes Sherran Needs, NP  triamcinolone cream (KENALOG) 0.1 % Apply 1 application topically 2 (two) times daily. 07/26/20  Yes Lauriana Denes, Ines Bloomer, MD  vitamin B-12 (CYANOCOBALAMIN) 1000 MCG tablet Take 1,000 mcg by mouth daily.   Yes [provider]  ipratropium (ATROVENT HFA) 17 MCG/ACT inhaler Inhale 2 puffs into the lungs every 6 (six) hours as needed for wheezing. 12/17/21 12/17/22  Deneise Lever, MD  potassium chloride (KLOR-CON) 10 MEQ tablet Take 4 tablets (40 mEq total) by mouth daily. 12/10/21 03/06/22  Shirley Friar, PA-C    Allergies  Allergen Reactions   Doxycycline     Made patient feel very ill    Metformin Diarrhea    Patient Active Problem List   Diagnosis Date Noted   Right hip pain 08/13/2022   Primary hyperaldosteronism ([place]) 03/06/2022   Coronary artery disease involving native coronary artery of native heart without angina pectoris 02/12/2022   Adrenal adenoma, right 02/07/2022   Skin lesion of right arm 02/02/2022   Asthma 11/01/2021    Normocytic anemia 10/11/2021   Nodule of right lung 09/30/2021   Dysthymia 03/12/2021   Primary osteoarthritis involving multiple joints 03/01/2021   Recurrent falls 01/10/2021   Sensorineural hearing loss (SNHL), 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   Persistent atrial fibrillation ([place]) 08/12/2020   Type 2 diabetes mellitus with hyperglycemia, with long-term current use of insulin ([place]) 06/21/2020   Type 2 diabetes mellitus with diabetic polyneuropathy, with long-term current use of insulin ([place]) 06/21/2020   Mixed hyperlipidemia 06/21/2020   Uncontrolled hypertension 06/11/2020   Aortic atherosclerosis ([place]) 03/28/2020   Diabetic peripheral neuropathy ([place]) 12/26/2019   Gastroesophageal reflux disease without esophagitis 03/17/2019   Rash and nonspecific skin eruption 03/17/2019   Pruritus 02/16/2019   Varicose veins of both legs with edema 02/16/2019   Heart murmur 09/05/2018   Restrictive lung disease 02/22/2018   Dysgeusia 06/11/2017   Osteoarthritis of left knee 06/03/2017  NAFLD (nonalcoholic fatty liver disease) 08/07/2016   Osteoporosis 12/31/2015   Obesity 12/19/2015   Acquired hypothyroidism 10/18/2015   Prolonged QT interval 10/03/2013   CHF (congestive heart failure) ([place]) 04/21/2013   Long term (current) use of anticoagulants 06/09/2012   Musculoskeletal back pain    BENIGN NEOPLASM OF ADRENAL GLAND 11/25/2010   Chronic diastolic heart failure ([place]) 02/20/2010   DM 05/16/2009   GOUT 05/16/2009   OBESITY, MORBID 05/16/2009   Hypertension associated with diabetes ([place]) 05/16/2009   Paroxysmal atrial fibrillation ([place]) 05/16/2009   HYPERLIPIDEMIA 11/30/2008   Obstructive sleep apnea on CPAP 11/30/2008    Past Medical History:  Diagnosis Date   Arthritis    Back pain    Chronic anticoagulation    due to aflutter   Chronic kidney disease    Diabetes mellitus    Diastolic CHF, chronic ([place])    a.  echo 2006 - ef 55-65%; mild diast dysfxn;    b. Echo 08/2011: Mild LVH, EF  60%;  c. 04/2013 Echo: EF 65-69%, mild conc LVH;  08/2014 Echo: EF 60-65%, mild-mod MR.   Gout    Hyperlipidemia    Hypertension    a.  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    Past Surgical History:  Procedure Laterality Date   APPENDECTOMY     ATRIAL FLUTTER ABLATION N/A 09/24/2011   Procedure: ATRIAL FLUTTER ABLATION;  Surgeon: [name], MD;  Location: [facility] CATH LAB;  Service: Cardiovascular;  Laterality: N/A;   CARDIOVERSION  10/22/2011   Procedure: CARDIOVERSION;  Surgeon: [name], MD;  Location: [place];  Service: Cardiovascular;  Laterality: N/A;   CARDIOVERSION N/A 09/10/2011   Procedure: CARDIOVERSION;  Surgeon: [name], MD;  Location: [facility] CATH LAB;  Service: Cardiovascular;  Laterality: N/A;   CHOLECYSTECTOMY     COLONOSCOPY WITH PROPOFOL N/A 06/13/2021   Procedure: COLONOSCOPY WITH PROPOFOL;  Surgeon: Milus Banister, MD;  Location: [facility] ENDOSCOPY;  Service: Endoscopy;  Laterality: N/A;   POLYPECTOMY  06/13/2021   Procedure: POLYPECTOMY;  Surgeon: Milus Banister, MD;  Location: [facility] ENDOSCOPY;  Service: Endoscopy;;   TONSILLECTOMY  1982   TOTAL ABDOMINAL HYSTERECTOMY      Social History   Socioeconomic History   Marital status: Married    Spouse name: Not on file   Number of children: 3   Years of education: Not on file   Highest education level: Not on file  Occupational History   Occupation: DISABILITY/housewife    Employer: RETIRED  Tobacco Use   Smoking status: Never   Smokeless tobacco: Never  Vaping Use   Vaping Use: Never used  Substance and Sexual Activity   Alcohol use: No   Drug use: No   Sexual activity: Yes  Other Topics Concern   Not on file  Social History Narrative   Not on file   Social Determinants of Health   Financial Resource Strain: Low Risk  (11/21/2022)   Overall  Financial Resource Strain (CARDIA)    Difficulty of Paying Living Expenses: Not hard at all  Food Insecurity: No Food Insecurity (11/21/2022)   Hunger Vital Sign    Worried About Running Out of Food in the Last Year: Never true    [place] in the Last Year: Never true  Transportation Needs: No  Transportation Needs (11/21/2022)   PRAPARE - Hydrologist (Medical): No    Lack of Transportation (Non-Medical): No  Physical Activity: Inactive (11/21/2022)   Exercise Vital Sign    Days of Exercise per Week: 0 days    Minutes of Exercise per Session: 0 min  Stress: No Stress Concern Present (11/21/2022)   [place]    Feeling of Stress : Not at all  Social Connections: [place] (11/21/2022)   Social Connection and Isolation Panel [NHANES]    Frequency of Communication with Friends and Family: More than three times a week    Frequency of Social Gatherings with Friends and Family: Never    Attends Religious Services: More than 4 times per year    Active Member of [employer] or Organizations: Yes    Attends Music therapist: More than 4 times per year    Marital Status: Married  Human resources officer Violence: Not At Risk (11/21/2022)   Humiliation, Afraid, Rape, and Kick questionnaire    Fear of Current or Ex-Partner: No    Emotionally Abused: No    Physically Abused: No    Sexually Abused: No    Family History  Problem Relation Age of Onset   Heart disease Father    Hypertension Father    Breast cancer Sister    Cancer Sister        breast   Colon cancer Neg Hx    Esophageal cancer Neg Hx    Pancreatic cancer Neg Hx    Liver disease Neg Hx      Review of Systems  Constitutional: Negative.  Negative for chills and fever.  HENT:  Negative for congestion and nosebleeds.   Eyes:  Negative for blurred vision, double vision, pain, discharge and redness.  Respiratory:  Negative  for cough, hemoptysis, shortness of breath and wheezing.   Cardiovascular:  Negative for chest pain, palpitations, orthopnea, leg swelling and PND.  Gastrointestinal:  Negative for abdominal pain, blood in stool, constipation, diarrhea, heartburn, melena, nausea and vomiting.  Genitourinary:  Negative for dysuria, flank pain, frequency, hematuria and urgency.  Musculoskeletal:  Negative for back pain, joint pain, myalgias and neck pain.  Skin:  Negative for rash.  Neurological:  Negative for dizziness, sensory change, speech change, focal weakness, seizures, loss of consciousness, weakness and headaches.  Endo/Heme/Allergies:  Negative for environmental allergies and polydipsia. Does not bruise/bleed easily.  Psychiatric/Behavioral:  Negative for depression, memory loss, substance abuse and suicidal ideas. The patient is not nervous/anxious and does not have insomnia.   All other systems reviewed and are negative.  Today's Vitals   12/30/22 0808  BP: 120/74  Pulse: 62  Temp: 98.1 F (36.7 C)  TempSrc: Oral  SpO2: 98%  Weight: 268 lb (121.6 kg)  Height: '5\' 1"'$  (1.549 m)   Body mass index is 50.64 kg/m.   Physical Exam Vitals reviewed.  Constitutional:      Appearance: She is obese.  HENT:     Head: Normocephalic.  Eyes:     Extraocular Movements: Extraocular movements intact.     Pupils: Pupils are equal, round, and reactive to light.  Cardiovascular:     Rate and Rhythm: Normal rate and regular rhythm.     Pulses: Normal pulses.     Heart sounds: Normal heart sounds.  Pulmonary:     Effort: Pulmonary effort is normal.     Breath sounds: Normal breath sounds.  Musculoskeletal:  Cervical back: No tenderness.     Right lower leg: No edema.     Left lower leg: No edema.  Lymphadenopathy:     Cervical: No cervical adenopathy.  Skin:    General: Skin is warm and dry.     Capillary Refill: Capillary refill takes less than 2 seconds.  Neurological:     General: No focal  deficit present.     Mental Status: She is alert and oriented to person, place, and time.  Psychiatric:        Mood and Affect: Mood normal.        Behavior: Behavior normal.      ASSESSMENT & PLAN: A total of 46 minutes was spent with the patient and counseling/coordination of care regarding preparing for this visit, review of most recent office visit notes, review of multiple chronic medical conditions under management, review of all medications, education on nutrition, cardiovascular risk associated with diabetes hypertension and dyslipidemia, review of health maintenance items, prognosis, documentation, and need for follow-up.  Problem List Items Addressed This Visit       Cardiovascular and Mediastinum   Hypertension associated with diabetes ([place])    Well-controlled hypertension. BP Readings from Last 3 Encounters:  12/30/22 120/74  12/12/22 130/70  10/29/22 120/72  Continue losartan 100 mg daily, metoprolol tartrate 25 mg twice a day, clonidine 0.3 mg twice a day, Aldactone 25 mg Hemoglobin A1c still not at goal Lab Results  Component Value Date   HGBA1C 8.9 (A) 12/12/2022  Sees endocrinologist on a regular basis. Continues on Humalog insulin 75/25 26 units twice a day Recently started on Mounjaro 5 mg weekly Cardiovascular risks associated with hypertension and diabetes discussed Diet and nutrition discussed.        Paroxysmal atrial fibrillation (HCC)    Well-controlled rate and rhythm. Continue beta-blocker with metoprolol tartrate 25 mg twice a day and rhythm control with dronedarone 400 mg twice a day Sees cardiologist on a regular basis. Continues Eliquis 5 mg twice a day. No clinical bleeding episodes. Fall precautions given.      CHF (congestive heart failure) (HCC)    Stable.  No signs or symptoms of acute CHF. Continues diuretic treatment with Demadex 20 mg daily and Aldactone 25 mg daily.  Also on losartan 100 mg daily and metoprolol tartrate 25 mg  twice a day. No signs of volume overload.  No peripheral edema.  Clear lungs.  Normotensive.        Respiratory   Obstructive sleep apnea on CPAP    Stable.  Continues on CPAP treatment.        Endocrine   Dyslipidemia associated with type 2 diabetes mellitus ([place])    Stable.  Diet and nutrition discussed. Continue rosuvastatin 10 mg daily.      Acquired hypothyroidism    Clinically euthyroid.  TSH done today. Continue Synthroid 100 mcg daily.      Relevant Orders   TSH (Completed)     Other   OBESITY, MORBID - Primary    Diet and nutrition discussed. Advised to decrease amount of daily carbohydrate intake and daily calories and increase amount of plant-based protein in her diet. Has appointment with medical weight management later this month.      Relevant Orders   TSH (Completed)   CBC with Differential/Platelet (Completed)   Comprehensive metabolic panel (Completed)   Patient Instructions  Calorie Counting for Weight Loss Calories are units of energy. Your body needs a certain number of calories from  food to keep going throughout the day. When you eat or drink more calories than your body needs, your body stores the extra calories mostly as fat. When you eat or drink fewer calories than your body needs, your body burns fat to get the energy it needs. Calorie counting means keeping track of how many calories you eat and drink each day. Calorie counting can be helpful if you need to lose weight. If you eat fewer calories than your body needs, you should lose weight. Ask your health care provider what a healthy weight is for you. For calorie counting to work, you will need to eat the right number of calories each day to lose a healthy amount of weight per week. A dietitian can help you figure out how many calories you need in a day and will suggest ways to reach your calorie goal. A healthy amount of weight to lose each week is usually 1-2 lb (0.5-0.9 kg). This usually means  that your daily calorie intake should be reduced by 500-750 calories. Eating 1,200-1,500 calories a day can help most women lose weight. Eating 1,500-1,800 calories a day can help most men lose weight. What do I need to know about calorie counting? Work with your health care provider or dietitian to determine how many calories you should get each day. To meet your daily calorie goal, you will need to: Find out how many calories are in each food that you would like to eat. Try to do this before you eat. Decide how much of the food you plan to eat. Keep a food log. Do this by writing down what you ate and how many calories it had. To successfully lose weight, it is important to balance calorie counting with a healthy lifestyle that includes regular activity. Where do I find calorie information?  The number of calories in a food can be found on a Nutrition Facts label. If a food does not have a Nutrition Facts label, try to look up the calories online or ask your dietitian for help. Remember that calories are listed per serving. If you choose to have more than one serving of a food, you will have to multiply the calories per serving by the number of servings you plan to eat. For example, the label on a package of bread might say that a serving size is 1 slice and that there are 90 calories in a serving. If you eat 1 slice, you will have eaten 90 calories. If you eat 2 slices, you will have eaten 180 calories. How do I keep a food log? After each time that you eat, record the following in your food log as soon as possible: What you ate. Be sure to include toppings, sauces, and other extras on the food. How much you ate. This can be measured in cups, ounces, or number of items. How many calories were in each food and drink. The total number of calories in the food you ate. Keep your food log near you, such as in a pocket-sized notebook or on an app or website on your mobile phone. Some programs will  calculate calories for you and show you how many calories you have left to meet your daily goal. What are some portion-control tips? Know how many calories are in a serving. This will help you know how many servings you can have of a certain food. Use a measuring cup to measure serving sizes. You could also try weighing out portions on a  kitchen scale. With time, you will be able to estimate serving sizes for some foods. Take time to put servings of different foods on your favorite plates or in your favorite bowls and cups so you know what a serving looks like. Try not to eat straight from a food's packaging, such as from a bag or box. Eating straight from the package makes it hard to see how much you are eating and can lead to overeating. Put the amount you would like to eat in a cup or on a plate to make sure you are eating the right portion. Use smaller plates, glasses, and bowls for smaller portions and to prevent overeating. Try not to multitask. For example, avoid watching TV or using your computer while eating. If it is time to eat, sit down at a table and enjoy your food. This will help you recognize when you are full. It will also help you be more mindful of what and how much you are eating. What are tips for following this plan? Reading food labels Check the calorie count compared with the serving size. The serving size may be smaller than what you are used to eating. Check the source of the calories. Try to choose foods that are high in protein, fiber, and vitamins, and low in saturated fat, trans fat, and sodium. Shopping Read nutrition labels while you shop. This will help you make healthy decisions about which foods to buy. Pay attention to nutrition labels for low-fat or fat-free foods. These foods sometimes have the same number of calories or more calories than the full-fat versions. They also often have added sugar, starch, or salt to make up for flavor that was removed with the  fat. Make a grocery list of lower-calorie foods and stick to it. Cooking Try to cook your favorite foods in a healthier way. For example, try baking instead of frying. Use low-fat dairy products. Meal planning Use more fruits and vegetables. One-half of your plate should be fruits and vegetables. Include lean proteins, such as chicken, [place], and fish. Lifestyle Each week, aim to do one of the following: 150 minutes of moderate exercise, such as walking. 75 minutes of vigorous exercise, such as running. General information Know how many calories are in the foods you eat most often. This will help you calculate calorie counts faster. Find a way of tracking calories that works for you. Get creative. Try different apps or programs if writing down calories does not work for you. What foods should I eat?  Eat nutritious foods. It is better to have a nutritious, high-calorie food, such as an avocado, than a food with few nutrients, such as a bag of potato chips. Use your calories on foods and drinks that will fill you up and will not leave you hungry soon after eating. Examples of foods that fill you up are nuts and nut butters, vegetables, lean proteins, and high-fiber foods such as whole grains. High-fiber foods are foods with more than 5 g of fiber per serving. Pay attention to calories in drinks. Low-calorie drinks include water and unsweetened drinks. The items listed above may not be a complete list of foods and beverages you can eat. Contact a dietitian for more information. What foods should I limit? Limit foods or drinks that are not good sources of vitamins, minerals, or protein or that are high in unhealthy fats. These include: Candy. Other sweets. Sodas, specialty coffee drinks, alcohol, and juice. The items listed above may not be a  complete list of foods and beverages you should avoid. Contact a dietitian for more information. How do I count calories when eating out? Pay  attention to portions. Often, portions are much larger when eating out. Try these tips to keep portions smaller: Consider sharing a meal instead of getting your own. If you get your own meal, eat only half of it. Before you start eating, ask for a container and put half of your meal into it. When available, consider ordering smaller portions from the menu instead of full portions. Pay attention to your food and drink choices. Knowing the way food is cooked and what is included with the meal can help you eat fewer calories. If calories are listed on the menu, choose the lower-calorie options. Choose dishes that include vegetables, fruits, whole grains, low-fat dairy products, and lean proteins. Choose items that are boiled, broiled, grilled, or steamed. Avoid items that are buttered, battered, fried, or served with cream sauce. Items labeled as crispy are usually fried, unless stated otherwise. Choose water, low-fat milk, unsweetened iced tea, or other drinks without added sugar. If you want an alcoholic beverage, choose a lower-calorie option, such as a glass of wine or light beer. Ask for dressings, sauces, and syrups on the side. These are usually high in calories, so you should limit the amount you eat. If you want a salad, choose a garden salad and ask for grilled meats. Avoid extra toppings such as bacon, cheese, or fried items. Ask for the dressing on the side, or ask for olive oil and vinegar or lemon to use as dressing. Estimate how many servings of a food you are given. Knowing serving sizes will help you be aware of how much food you are eating at restaurants. Where to find more information Centers for Disease Control and Prevention: http://www.wolf.info/ U.S. Department of Agriculture: http://www.wilson-mendoza.org/ Summary Calorie counting means keeping track of how many calories you eat and drink each day. If you eat fewer calories than your body needs, you should lose weight. A healthy amount of weight to lose per  week is usually 1-2 lb (0.5-0.9 kg). This usually means reducing your daily calorie intake by 500-750 calories. The number of calories in a food can be found on a Nutrition Facts label. If a food does not have a Nutrition Facts label, try to look up the calories online or ask your dietitian for help. Use smaller plates, glasses, and bowls for smaller portions and to prevent overeating. Use your calories on foods and drinks that will fill you up and not leave you hungry shortly after a meal. This information is not intended to replace advice given to you by your health care provider. Make sure you discuss any questions you have with your health care provider. Document Revised: 11/17/2019 Document Reviewed: 11/17/2019 Elsevier Patient Education  [place], MD [facility]

## 2022-12-30 NOTE — Assessment & Plan Note (Signed)
Well-controlled rate and rhythm. Continue beta-blocker with metoprolol tartrate 25 mg twice a day and rhythm control with dronedarone 400 mg twice a day Sees cardiologist on a regular basis. Continues Eliquis 5 mg twice a day. No clinical bleeding episodes. Fall precautions given.

## 2022-12-31 ENCOUNTER — Other Ambulatory Visit: Payer: Self-pay | Admitting: Emergency Medicine

## 2022-12-31 ENCOUNTER — Telehealth: Payer: Self-pay | Admitting: Emergency Medicine

## 2022-12-31 DIAGNOSIS — N289 Disorder of kidney and ureter, unspecified: Secondary | ICD-10-CM

## 2022-12-31 DIAGNOSIS — E039 Hypothyroidism, unspecified: Secondary | ICD-10-CM

## 2022-12-31 DIAGNOSIS — N1832 Chronic kidney disease, stage 3b: Secondary | ICD-10-CM

## 2022-12-31 MED ORDER — LEVOTHYROXINE SODIUM 112 MCG PO TABS: 112.0000 ug | ORAL_TABLET | Freq: Every day | ORAL | 3 refills | Status: DC

## 2022-12-31 NOTE — Telephone Encounter (Signed)
Spoke to patient today about blood results. Elevated TSH.  Patient compliant with medication.  Recommend to increase dose of Synthroid. New prescription sent today to pharmacy of record. Worsened GFR and creatinine.  Recommend nephrology evaluation.  Referral placed today. Could be related to diuretic use.  Recommend to stop Aldactone.

## 2023-01-01 NOTE — Telephone Encounter (Signed)
Called patient and informed her of provider recommendation 

## 2023-01-01 NOTE — Telephone Encounter (Signed)
No other medication

## 2023-01-01 NOTE — Telephone Encounter (Signed)
Patient called and states that she does not take Aldactone - Is there another medication that she needs to stop taking?  Please call patient and let her know.  Patient's number:  [phone]

## 2023-01-02 NOTE — Progress Notes (Signed)
  Subjective:  Patient ID: [name], female    DOB: 07/23/1941,  MRN: [id number]  [name] presents to clinic today for at risk foot care with history of diabetic neuropathy and painful thick toenails that are difficult to trim. Pain interferes with ambulation. Aggravating factors include wearing enclosed shoe gear. Pain is relieved with periodic professional debridement.  Chief Complaint  Patient presents with   Nail Problem    DFC BS-180 A1C-8.? PCP-Sagardia PCP VST-12/30/2022   New problem(s): None.   PCP is Horald Pollen, MD.  Allergies  Allergen Reactions   Doxycycline     Made patient feel very ill    Metformin Diarrhea   Review of Systems: Negative except as noted in the HPI.  Objective: No changes noted in today's physical examination. There were no vitals filed for this visit. [name] is a pleasant 82 y.o. female morbidly obese in NAD. AAO x 3.  Neurovascular Examination: CFT immediate b/l LE. Palpable DP/PT pulses b/l LE. Digital hair present b/l. Skin temperature gradient WNL b/l. No pain with calf compression b/l. No edema noted b/l. No cyanosis or clubbing noted b/l LE.  Pt has subjective symptoms of neuropathy. Protective sensation intact 5/5 intact bilaterally with 10g monofilament b/l. Vibratory sensation intact b/l.  Dermatological:  Pedal integument with normal turgor, texture and tone b/l LE. No open wounds b/l. No interdigital macerations b/l. Toenails 1-5 b/l elongated, thickened, discolored with subungual debris. +Tenderness with dorsal palpation of nailplates. No hyperkeratotic or porokeratotic lesions present.  Musculoskeletal:  Muscle strength 4/5 to all lower extremity muscle groups bilaterally. Using office transport chair on today's visit. No pain, crepitus or joint limitation noted with ROM bilateral LE. HAV with bunion deformity noted b/l LE. Hammertoe deformity noted 2-5 b/l.  Assessment/Plan: 1. Pain due to  onychomycosis of toenails of both feet   2. Type 2 diabetes mellitus with diabetic polyneuropathy, with long-term current use of insulin ([place])     -Consent given for treatment as described below: -Examined patient. -Continue supportive shoe gear daily. -Toenails 1-5 b/l were debrided in length and girth with sterile nail nippers and dremel without iatrogenic bleeding.  -Patient/POA to call should there be question/concern in the interim.   Return in about 3 months (around 04/01/2023).  Marzetta Board, DPM

## 2023-01-05 ENCOUNTER — Telehealth: Payer: Self-pay | Admitting: Emergency Medicine

## 2023-01-05 ENCOUNTER — Ambulatory Visit (INDEPENDENT_AMBULATORY_CARE_PROVIDER_SITE_OTHER): Payer: Medicare Other | Admitting: Family Medicine

## 2023-01-05 ENCOUNTER — Encounter: Payer: Self-pay | Admitting: Family Medicine

## 2023-01-05 VITALS — BP 112/66 | HR 68 | Temp 98.0°F | Resp 22 | Ht 61.0 in | Wt 270.0 lb

## 2023-01-05 DIAGNOSIS — R051 Acute cough: Secondary | ICD-10-CM | POA: Diagnosis not present

## 2023-01-05 DIAGNOSIS — R21 Rash and other nonspecific skin eruption: Secondary | ICD-10-CM | POA: Diagnosis not present

## 2023-01-05 DIAGNOSIS — U071 COVID-19: Secondary | ICD-10-CM

## 2023-01-05 LAB — POCT INFLUENZA A/B
Influenza A, POC: NEGATIVE
Influenza B, POC: NEGATIVE

## 2023-01-05 LAB — POC COVID19 BINAXNOW: SARS Coronavirus 2 Ag: POSITIVE — AB

## 2023-01-05 MED ORDER — DEXAMETHASONE 6 MG PO TABS: 6.0000 mg | ORAL_TABLET | Freq: Every day | ORAL | 0 refills | Status: AC

## 2023-01-05 NOTE — Telephone Encounter (Signed)
Patient called wanted something for her cough. Pt was seen in office today 01/05/23 it and was told they was going to prescribe her the cough medicine but the no one sent anything in.

## 2023-01-05 NOTE — Patient Instructions (Addendum)
Coricidin HBP, throat lozenges, chloraseptic spray, warm salt water gargles, hot tea/honey, Tylenol, Vicks, and a humidifier at night.

## 2023-01-05 NOTE — Progress Notes (Signed)
Assessment & Plan:  1. COVID-19 Education provided on COVID-19.  Encouraged symptom management.  Discussed typical duration and expected course.  I did put her on steroids, but not Paxlovid due to her declining kidney function and multiple potential drug interactions with what she is currently taking and Paxlovid.  Recommended quarantine x 5 days and then wearing a mask until day 10. - dexamethasone (DECADRON) 6 MG tablet; Take 1 tablet (6 mg total) by mouth daily for 5 days.  Dispense: 5 tablet; Refill: 0  2. Localized rash Education provided on rashes. - dexamethasone (DECADRON) 6 MG tablet; Take 1 tablet (6 mg total) by mouth daily for 5 days.  Dispense: 5 tablet; Refill: 0   Results for orders placed or performed in visit on 01/05/23  POC COVID-19 BinaxNow  Result Value Ref Range   SARS Coronavirus 2 Ag Positive (A) Negative  POCT Influenza A/B  Result Value Ref Range   Influenza A, POC Negative Negative   Influenza B, POC Negative Negative    Follow up plan: Return if symptoms worsen or fail to improve.  Hendricks Limes, MSN, APRN, FNP-C  Subjective:  HPI: [name] is a 82 y.o. female presenting on 01/05/2023 for Cough (Non productive - started Saturday AM /Thinks may have ran fever (no thermometer in the house) ) and Rash (Bilateral upper arms - burning and itching, started about 1 week ago )  Patient complains of cough, head congestion, headache, runny nose, sore throat, fever, and postnasal drainage. Max temp unknown as she does not have a thermometer in the house. She denies shortness of breath and wheezing. Onset of symptoms was 2 days ago, gradually worsening since that time. She is drinking plenty of fluids. Evaluation to date: none. Treatment to date: none.  She does not smoke. Her husband was sick last week.  She is also concerned about a rash on both arms that started one week ago. She endorses itching and burning.  Denies any new exposures or any change in soap,  lotion, deodorant, laundry detergent, or fabric softeners.    ROS: Negative unless specifically indicated above in HPI.   Relevant past medical history reviewed and updated as indicated.   Allergies and medications reviewed and updated.   Current Outpatient Medications:    Accu-Chek Softclix Lancets lancets, 1 each by Other route 3 (three) times daily. as directed, Disp: 100 each, Rfl: 3   allopurinol (ZYLOPRIM) 300 MG tablet, TAKE 1 TABLET BY MOUTH DAILY, Disp: 100 tablet, Rfl: 2   apixaban (ELIQUIS) 5 MG TABS tablet, TAKE 1 TABLET BY MOUTH TWICE  DAILY, Disp: 200 tablet, Rfl: 1   azelastine (ASTELIN) 0.1 % nasal spray, 1-2 puffs each nostril at bedtime as needed, Disp: 30 mL, Rfl: 12   b complex vitamins capsule, Take 1 capsule by mouth daily., Disp: , Rfl:    BD PEN NEEDLE NANO 2ND GEN 32G X 4 MM MISC, USE TWICE DAILY AS NEEDED, Disp: 100 each, Rfl: 11   blood glucose meter kit and supplies, Dispense based on patient and insurance preference. Use up to four times daily as directed. (FOR ICD-10 E10.9, E11.9)., Disp: 1 each, Rfl: 0   cholecalciferol (VITAMIN D3) 25 MCG (1000 UNIT) tablet, Take 1,000 Units by mouth daily., Disp: , Rfl:    clobetasol cream (TEMOVATE) [id number] %, Apply 1 Application topically 2 (two) times daily., Disp: 30 g, Rfl: 1   cloNIDine (CATAPRES) 0.3 MG tablet, TAKE 1 TABLET BY MOUTH TWICE  DAILY, Disp: 200  tablet, Rfl: 2   Continuous Blood Gluc Receiver (FREESTYLE LIBRE 14 DAY READER) DEVI, Use as directed., Disp: 1 each, Rfl: 0   Continuous Blood Gluc Sensor (FREESTYLE LIBRE 2 SENSOR) MISC, USE AS DIRECTED TO CHECK BLOOD  SUGAR DAILY, Disp: 8 each, Rfl: 2   dronedarone (MULTAQ) 400 MG tablet, Take 1 tablet (400 mg total) by mouth 2 (two) times daily., Disp: 180 tablet, Rfl: 3   Glucagon (GVOKE HYPOPEN 2-PACK) 0.5 MG/0.1ML SOAJ, Inject 0.5 mg into the skin daily as needed. For Hypoglycemic events, Disp: 0.2 mL, Rfl: 5   HYDROcodone-acetaminophen (NORCO) 5-325 MG tablet,  Take 1 tablet by mouth every 6 (six) hours as needed for moderate pain., Disp: 30 tablet, Rfl: 0   hydrOXYzine (ATARAX) 10 MG tablet, Take 1 tablet (10 mg total) by mouth every 6 (six) hours as needed., Disp: 30 tablet, Rfl: 1   Insulin Lispro Prot & Lispro (HUMALOG MIX 75/25 KWIKPEN) (75-25) 100 UNIT/ML Kwikpen, Inject 26 Units into the skin 2 (two) times daily before a meal., Disp: 60 mL, Rfl: 2   ketorolac (ACULAR) 0.5 % ophthalmic solution, SMARTSIG:In Eye(s), Disp: , Rfl:    levothyroxine (SYNTHROID) 112 MCG tablet, Take 1 tablet (112 mcg total) by mouth daily., Disp: 90 tablet, Rfl: 3   lidocaine (LIDODERM) 5 %, Place 1 patch onto the skin daily. Remove & Discard patch within 12 hours or as directed by MD, Disp: 30 patch, Rfl: 0   losartan (COZAAR) 100 MG tablet, TAKE 1 TABLET BY MOUTH DAILY, Disp: 70 tablet, Rfl: 4   metoprolol tartrate (LOPRESSOR) 25 MG tablet, Take 1 tablet (25 mg total) by mouth 2 (two) times daily., Disp: 180 tablet, Rfl: 3   nystatin powder, Apply 1 Application topically 2 (two) times daily., Disp: 60 g, Rfl: 7   ONETOUCH VERIO test strip, 1 each by Other route 3 (three) times daily., Disp: , Rfl:    RESTASIS MULTIDOSE 0.05 % ophthalmic emulsion, Place 1 drop into both eyes 2 (two) times daily., Disp: , Rfl:    rosuvastatin (CRESTOR) 10 MG tablet, TAKE 1 TABLET BY MOUTH IN  THE EVENING, Disp: 90 tablet, Rfl: 3   tirzepatide (MOUNJARO) 5 MG/0.5ML Pen, Inject 5 mg into the skin once a week., Disp: 6 mL, Rfl: 3   TOBRADEX ophthalmic ointment, , Disp: , Rfl:    torsemide (DEMADEX) 20 MG tablet, Take 1 tablet (20 mg total) by mouth daily., Disp: , Rfl:    triamcinolone cream (KENALOG) 0.1 %, Apply 1 application topically 2 (two) times daily., Disp: 30 g, Rfl: 0   vitamin B-12 (CYANOCOBALAMIN) 1000 MCG tablet, Take 1,000 mcg by mouth daily., Disp: , Rfl:    ipratropium (ATROVENT HFA) 17 MCG/ACT inhaler, Inhale 2 puffs into the lungs every 6 (six) hours as needed for wheezing.,  Disp: 1 each, Rfl: 2  Allergies  Allergen Reactions   Doxycycline     Made patient feel very ill    Metformin Diarrhea    Objective:   BP 112/66   Pulse 68   Temp 98 F (36.7 C)   Resp (!) 22   Ht 5\' 1"  (1.549 m)   Wt 270 lb (122.5 kg)   BMI 51.02 kg/m    Physical Exam Vitals reviewed.  Constitutional:      General: She is not in acute distress.    Appearance: Normal appearance. She is not ill-appearing, toxic-appearing or diaphoretic.  HENT:     Head: Normocephalic and atraumatic.  Right Ear: Tympanic membrane, ear canal and external ear normal. There is no impacted cerumen.     Left Ear: Tympanic membrane, ear canal and external ear normal. There is no impacted cerumen.     Nose: Nose normal. No congestion or rhinorrhea.     Right Sinus: No maxillary sinus tenderness or frontal sinus tenderness.     Left Sinus: No maxillary sinus tenderness or frontal sinus tenderness.     Mouth/Throat:     Mouth: Mucous membranes are moist.     Pharynx: Oropharynx is clear. No oropharyngeal exudate or posterior oropharyngeal erythema.  Eyes:     General: No scleral icterus.       Right eye: No discharge.        Left eye: No discharge.     Conjunctiva/sclera: Conjunctivae normal.  Cardiovascular:     Rate and Rhythm: Normal rate and regular rhythm.     Heart sounds: Normal heart sounds. No murmur heard.    No friction rub. No gallop.  Pulmonary:     Effort: Pulmonary effort is normal. No respiratory distress.     Breath sounds: Normal breath sounds. No stridor. No wheezing, rhonchi or rales.  Musculoskeletal:        General: Normal range of motion.     Cervical back: Normal range of motion.  Lymphadenopathy:     Cervical: No cervical adenopathy.  Skin:    General: Skin is warm and dry.     Capillary Refill: Capillary refill takes less than 2 seconds.     Findings: Rash present. Rash is urticarial (both upper arms (left worse than right)).  Neurological:     General: No  focal deficit present.     Mental Status: She is alert and oriented to person, place, and time. Mental status is at baseline.  Psychiatric:        Mood and Affect: Mood normal.        Behavior: Behavior normal.        Thought Content: Thought content normal.        Judgment: Judgment normal.

## 2023-01-06 ENCOUNTER — Other Ambulatory Visit: Payer: Self-pay | Admitting: Emergency Medicine

## 2023-01-06 DIAGNOSIS — R051 Acute cough: Secondary | ICD-10-CM

## 2023-01-06 MED ORDER — GUAIFENESIN-CODEINE 100-10 MG/5ML PO SYRP: 5.0000 mL | ORAL_SOLUTION | Freq: Three times a day (TID) | ORAL | 1 refills | Status: DC | PRN

## 2023-01-06 NOTE — Telephone Encounter (Signed)
Was seen in the office yesterday by Camila Li.  She did not send cough medication. I just sent a new prescription to her pharmacy of record today.  Thanks.

## 2023-01-06 NOTE — Telephone Encounter (Signed)
Notified pt w/ MD response../lb ?

## 2023-01-07 ENCOUNTER — Telehealth: Payer: Self-pay | Admitting: Emergency Medicine

## 2023-01-07 ENCOUNTER — Telehealth: Payer: Self-pay | Admitting: Internal Medicine

## 2023-01-07 ENCOUNTER — Ambulatory Visit (INDEPENDENT_AMBULATORY_CARE_PROVIDER_SITE_OTHER): Payer: Medicare Other | Admitting: Emergency Medicine

## 2023-01-07 ENCOUNTER — Encounter: Payer: Self-pay | Admitting: Emergency Medicine

## 2023-01-07 VITALS — BP 126/70 | HR 62 | Temp 97.9°F | Ht 61.0 in | Wt 268.0 lb

## 2023-01-07 DIAGNOSIS — U071 COVID-19: Secondary | ICD-10-CM | POA: Diagnosis not present

## 2023-01-07 DIAGNOSIS — E1159 Type 2 diabetes mellitus with other circulatory complications: Secondary | ICD-10-CM

## 2023-01-07 DIAGNOSIS — N1832 Chronic kidney disease, stage 3b: Secondary | ICD-10-CM | POA: Diagnosis not present

## 2023-01-07 DIAGNOSIS — I152 Hypertension secondary to endocrine disorders: Secondary | ICD-10-CM

## 2023-01-07 DIAGNOSIS — R051 Acute cough: Secondary | ICD-10-CM

## 2023-01-07 LAB — GLUCOSE, POCT (MANUAL RESULT ENTRY): POC Glucose: 244 mg/dl — AB (ref 70–99)

## 2023-01-07 MED ORDER — FREESTYLE LIBRE 2 READER DEVI: 3 refills | Status: DC

## 2023-01-07 MED ORDER — GUAIFENESIN-CODEINE 100-10 MG/5ML PO SYRP: 5.0000 mL | ORAL_SOLUTION | Freq: Three times a day (TID) | ORAL | 1 refills | Status: DC | PRN

## 2023-01-07 NOTE — Telephone Encounter (Signed)
Patient states that she has not received her monitor.  Please call it in.  Patient was seen this morning and patient thought it was going to be called in then.

## 2023-01-07 NOTE — Assessment & Plan Note (Signed)
Well-controlled hypertension. Sugars have been elevated due to dexamethasone recently prescribed Advised to stop corticosteroid and continue present medications as directed by endocrinologist

## 2023-01-07 NOTE — Assessment & Plan Note (Addendum)
Recently diagnosed.  Husband also with COVID at home Clinically stable.  No complications.  Running its course as expected ED precautions given COVID instructions given. Advised to rest and stay well-hydrated.

## 2023-01-07 NOTE — Telephone Encounter (Addendum)
Patient is calling to say that blood sugar level is running extremely high and she would like to know what she needs to do.  Patient was diagnosed Monday, 01/05/2023 with COVID.  Patient states that on 01/05/2023 the reading was 340 at 10:00 AM.  At 4:00 PM the same day it was 345.  Yesterday (01/06/2023) at 2:00 PM it was 565.  Patient states that her FreeStyle Elenor Legato 2 is not working at all since her last reading at 4:00 PM yesterday.  Patient's pharmacy is [id number] - [place], [place] (Ph: [phone]) .  Patient also states that she has extreme itching in both armpits.

## 2023-01-07 NOTE — Assessment & Plan Note (Signed)
Recently diagnosed and referred to nephrology.  Has not been contacted yet.  Given office information and phone number to contact. Advised to stay well-hydrated and avoid NSAIDs

## 2023-01-07 NOTE — Patient Instructions (Signed)
Stop dexamethasone pills Take Cheratussin for cough as needed Make sure to follow-up with nephrologist.  Cough, Adult A cough helps to clear your throat and lungs. It may be a sign of an illness or another condition. A short-term (acute) cough may last 2-3 weeks. A long-term (chronic) cough may last 8 or more weeks. Many things can cause a cough. They include: Illnesses such as: An infection in your throat or lungs. Asthma or other heart or lung problems. Gastroesophageal reflux. This is when acid comes back up from your stomach. Breathing in things that bother (irritate) your lungs. Allergies. Postnasal drip. This is when mucus runs down the back of your throat. Smoking. Some medicines. Follow these instructions at home: Medicines Take over-the-counter and prescription medicines only as told by your doctor. Talk with your doctor before you take cough medicine (cough suppressants). Eating and drinking Do not drink alcohol. Do not drink caffeine. Drink enough fluid to keep your pee (urine) pale yellow. Lifestyle Stay away from cigarette smoke. Do not smoke or use any products that contain nicotine or tobacco. If you need help quitting, ask your doctor. Stay away from things that make you cough. These may include perfume, candles, cleaning products, or campfire smoke. General instructions  Watch for any changes to your cough. Tell your doctor about them. Always cover your mouth when you cough. If the air is dry in your home, use a cool mist vaporizer or humidifier. If your cough is worse at night, try using extra pillows to raise your head up higher while you sleep. Rest as needed. Contact a doctor if: You have new symptoms. Your symptoms get worse. You cough up pus. You have a fever that does not go away. Your cough does not get better after 2-3 weeks. Cough medicine does not help, and you are not sleeping well. You have pain that gets worse or is not helped with  medicine. You are losing weight and do not know why. You have night sweats. Get help right away if: You cough up blood. You have trouble breathing. Your heart is beating very fast. These symptoms may be an emergency. Get help right away. Call 911. Do not wait to see if the symptoms will go away. Do not drive yourself to the hospital. This information is not intended to replace advice given to you by your health care provider. Make sure you discuss any questions you have with your health care provider. Document Revised: 06/06/2022 Document Reviewed: 06/06/2022 Elsevier Patient Education  [place].

## 2023-01-07 NOTE — Progress Notes (Signed)
[name] 82 y.o.   Chief Complaint  Patient presents with   Cough    Patient is still coughing, itching     HISTORY OF PRESENT ILLNESS: This is a 82 y.o. female here for follow-up of COVID infection.  Seen in the office 2 days ago with flulike symptoms and cough that started last Saturday. COVID test was positive.  Was started on dexamethasone 6 mg daily for 5 days.  Patient is diabetic.  Glucose went up to 500s last night. Still coughing but starting to feel little better. No complications. No other needs or medical concerns today.  Cough Associated symptoms include a rash. Pertinent negatives include no chest pain, chills, fever, headaches, hemoptysis, sore throat, shortness of breath or wheezing.     Prior to Admission medications   Medication Sig Start Date End Date Taking? Authorizing Provider  Accu-Chek Softclix Lancets lancets 1 each by Other route 3 (three) times daily. as directed 03/30/20  Yes Maximiano Coss, NP  allopurinol (ZYLOPRIM) 300 MG tablet TAKE 1 TABLET BY MOUTH DAILY 11/13/22  Yes Taevyn Hausen, Ines Bloomer, MD  apixaban (ELIQUIS) 5 MG TABS tablet TAKE 1 TABLET BY MOUTH TWICE  DAILY 11/10/22  Yes [name], MD  azelastine (ASTELIN) 0.1 % nasal spray 1-2 puffs each nostril at bedtime as needed 01/02/22  Yes Young, Tarri Fuller D, MD  b complex vitamins capsule Take 1 capsule by mouth daily.   Yes [provider]  BD PEN NEEDLE NANO 2ND GEN 32G X 4 MM MISC USE TWICE DAILY AS NEEDED 08/02/22  Yes Othello Dickenson, Ines Bloomer, MD  blood glucose meter kit and supplies Dispense based on patient and insurance preference. Use up to four times daily as directed. (FOR ICD-10 E10.9, E11.9). 08/29/21  Yes Cage Gupton, Ines Bloomer, MD  cholecalciferol (VITAMIN D3) 25 MCG (1000 UNIT) tablet Take 1,000 Units by mouth daily.   Yes [provider]  clobetasol cream (TEMOVATE) [id number] % Apply 1 Application topically 2 (two) times daily. 06/05/22  Yes Philander Ake, Ines Bloomer, MD   cloNIDine (CATAPRES) 0.3 MG tablet TAKE 1 TABLET BY MOUTH TWICE  DAILY 11/06/22  Yes Baldo Hufnagle, Ines Bloomer, MD  Continuous Blood Gluc Sensor (FREESTYLE LIBRE 2 SENSOR) MISC USE AS DIRECTED TO CHECK BLOOD  SUGAR DAILY 04/06/22  Yes Ajamu Maxon, Ines Bloomer, MD  dexamethasone (DECADRON) 6 MG tablet Take 1 tablet (6 mg total) by mouth daily for 5 days. 01/05/23 01/10/23 Yes Hendricks Limes F, FNP  dronedarone (MULTAQ) 400 MG tablet Take 1 tablet (400 mg total) by mouth 2 (two) times daily. 12/19/21  Yes [name], MD  Glucagon (GVOKE HYPOPEN 2-PACK) 0.5 MG/0.1ML SOAJ Inject 0.5 mg into the skin daily as needed. For Hypoglycemic events 05/30/22  Yes Luzmaria Devaux, Ines Bloomer, MD  guaiFENesin-codeine [facility]) 100-10 MG/5ML syrup Take 5 mLs by mouth 3 (three) times daily as needed for cough. 01/06/23  Yes Jacari Iannello, Ines Bloomer, MD  HYDROcodone-acetaminophen (NORCO) 5-325 MG tablet Take 1 tablet by mouth every 6 (six) hours as needed for moderate pain. 08/12/22  Yes Sal Spratley, Ines Bloomer, MD  hydrOXYzine (ATARAX) 10 MG tablet Take 1 tablet (10 mg total) by mouth every 6 (six) hours as needed. 08/12/22  Yes Stepheni Cameron, Ines Bloomer, MD  Insulin Lispro Prot & Lispro (HUMALOG MIX 75/25 KWIKPEN) (75-25) 100 UNIT/ML Kwikpen Inject 26 Units into the skin 2 (two) times daily before a meal. 12/12/22  Yes [name], Melanie Crazier, MD  ketorolac (ACULAR) 0.5 % ophthalmic solution SMARTSIG:In Eye(s) 08/22/21  Yes  [provider]  levothyroxine (SYNTHROID) 112 MCG tablet Take 1 tablet (112 mcg total) by mouth daily. 12/31/22  Yes Karsynn Deweese, Ines Bloomer, MD  lidocaine (LIDODERM) 5 % Place 1 patch onto the skin daily. Remove & Discard patch within 12 hours or as directed by MD 08/29/20  Yes Mcarthur Rossetti, MD  losartan (COZAAR) 100 MG tablet TAKE 1 TABLET BY MOUTH DAILY 10/13/22  Yes Ireland Virrueta, Ines Bloomer, MD  metoprolol tartrate (LOPRESSOR) 25 MG tablet Take 1 tablet (25 mg total) by mouth 2 (two) times daily.  10/29/22 10/24/23 Yes Kenechukwu Eckstein, Ines Bloomer, MD  nystatin powder Apply 1 Application topically 2 (two) times daily. 08/12/22  Yes Genae Strine, Ines Bloomer, MD  [facility] VERIO test strip 1 each by Other route 3 (three) times daily. 06/08/20  Yes [provider]  RESTASIS MULTIDOSE 0.05 % ophthalmic emulsion Place 1 drop into both eyes 2 (two) times daily. 03/04/20  Yes [provider]  rosuvastatin (CRESTOR) 10 MG tablet TAKE 1 TABLET BY MOUTH IN  THE EVENING 04/03/22  Yes Beckett Hickmon, [place], MD  tirzepatide [facility]) 5 MG/0.5ML Pen Inject 5 mg into the skin once a week. 12/12/22  Yes [name], Melanie Crazier, MD  TOBRADEX ophthalmic ointment  08/25/21  Yes [provider]  torsemide (DEMADEX) 20 MG tablet Take 1 tablet (20 mg total) by mouth daily. 05/30/21  Yes Sherran Needs, NP  triamcinolone cream (KENALOG) 0.1 % Apply 1 application topically 2 (two) times daily. 07/26/20  Yes Findley Vi, Ines Bloomer, MD  vitamin B-12 (CYANOCOBALAMIN) 1000 MCG tablet Take 1,000 mcg by mouth daily.   Yes [provider]  ipratropium (ATROVENT HFA) 17 MCG/ACT inhaler Inhale 2 puffs into the lungs every 6 (six) hours as needed for wheezing. 12/17/21 12/17/22  Deneise Lever, MD  potassium chloride (KLOR-CON) 10 MEQ tablet Take 4 tablets (40 mEq total) by mouth daily. 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   Coronary artery disease involving native coronary artery of native heart without angina pectoris 02/12/2022   Adrenal adenoma, right 02/07/2022   Skin lesion of right arm 02/02/2022   Asthma 11/01/2021   Normocytic anemia 10/11/2021   Nodule of right lung 09/30/2021   Dysthymia 03/12/2021   Primary osteoarthritis involving multiple  joints 03/01/2021   Recurrent falls 01/10/2021   Sensorineural hearing loss (SNHL), 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   Persistent atrial fibrillation ([place]) 08/12/2020   Type 2 diabetes mellitus with hyperglycemia, with long-term current use of insulin ([place]) 06/21/2020   Dyslipidemia associated with type 2 diabetes mellitus ([place]) 06/21/2020   Mixed hyperlipidemia 06/21/2020   Uncontrolled hypertension 06/11/2020   Aortic atherosclerosis ([place]) 03/28/2020   Diabetic peripheral neuropathy ([place]) 12/26/2019   Gastroesophageal reflux disease without esophagitis 03/17/2019   Rash and nonspecific skin eruption 03/17/2019   Pruritus 02/16/2019   Varicose veins of both legs with edema 02/16/2019   Heart murmur 09/05/2018   Restrictive lung disease 02/22/2018   Dysgeusia 06/11/2017   Osteoarthritis of left knee 06/03/2017   NAFLD (nonalcoholic fatty liver disease) 08/07/2016   Osteoporosis 12/31/2015   Obesity 12/19/2015   Acquired hypothyroidism 10/18/2015   Prolonged QT interval 10/03/2013   CHF (congestive heart failure) ([place]) 04/21/2013   Long term (current)  use of anticoagulants 06/09/2012   Musculoskeletal back pain    BENIGN NEOPLASM OF ADRENAL GLAND 11/25/2010   Chronic diastolic heart failure ([place]) 02/20/2010   DM 05/16/2009   GOUT 05/16/2009   OBESITY, MORBID 05/16/2009   Hypertension associated with diabetes ([place]) 05/16/2009   Paroxysmal atrial fibrillation ([place]) 05/16/2009   HYPERLIPIDEMIA 11/30/2008   Obstructive sleep apnea on CPAP 11/30/2008    Past Medical History:  Diagnosis Date   Arthritis    Back pain    Chronic anticoagulation    due to aflutter   Chronic kidney disease    Diabetes mellitus    Diastolic CHF, chronic ([place])    a.  echo 2006 - ef 55-65%; mild diast dysfxn;    b. Echo 08/2011: Mild LVH, EF 60%;  c. 04/2013 Echo: EF 65-69%, mild conc LVH;  08/2014 Echo: EF 60-65%, mild-mod MR.   Gout    Hyperlipidemia    Hypertension    a.  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    Past Surgical History:  Procedure Laterality Date   APPENDECTOMY     ATRIAL FLUTTER ABLATION N/A 09/24/2011   Procedure: ATRIAL FLUTTER ABLATION;  Surgeon: [name], MD;  Location: [facility] C[facility] LAB;  Service: Cardiovascular;  Laterality: N/A;   CARDIOVERSION  10/22/2011   Procedure: CARDIOVERSION;  Surgeon: [name], MD;  Location: [facility];  Service: Cardiovascular;  Laterality: N/A;   CARDIOVERSION N/A 09/10/2011   Procedure: CARDIOVERSION;  Surgeon: [name], MD;  Location: [facility] CATH LAB;  Service: Cardiovascular;  Laterality: N/A;   CHOLECYSTECTOMY     COLONOSCOPY WITH PROPOFOL N/A 06/13/2021   Procedure: COLONOSCOPY WITH PROPOFOL;  Surgeon: Milus Banister, MD;  Location: [facility] ENDOSCOPY;  Service: Endoscopy;  Laterality: N/A;   POLYPECTOMY  06/13/2021   Procedure: POLYPECTOMY;  Surgeon: Milus Banister, MD;  Location: [facility] ENDOSCOPY;  Service: Endoscopy;;   TONSILLECTOMY  1982   TOTAL ABDOMINAL HYSTERECTOMY      Social History   Socioeconomic History   Marital status: Married    Spouse name: Not on file   Number of children: 3   Years of education: Not on file   Highest education level: Not on file  Occupational History   Occupation: DISABILITY/housewife    Employer: RETIRED  Tobacco Use   Smoking status: Never   Smokeless tobacco: Never  Vaping Use   Vaping Use: Never used  Substance and Sexual Activity   Alcohol use: No   Drug use: No   Sexual activity: Yes  Other Topics Concern   Not on file  Social History Narrative   Not on file   Social Determinants of Health   Financial Resource Strain: Low Risk  (11/21/2022)   Overall Financial Resource Strain (CARDIA)    Difficulty of Paying Living Expenses: Not hard at all  Food Insecurity: No Food Insecurity (11/21/2022)   Hunger Vital Sign     Worried About Running Out of Food in the Last Year: Never true    [place] in the Last Year: Never true  Transportation Needs: No Transportation Needs (11/21/2022)   PRAPARE - Hydrologist (Medical): No    Lack of Transportation (Non-Medical): No  Physical Activity: Inactive (11/21/2022)   Exercise Vital Sign    Days  of Exercise per Week: 0 days    Minutes of Exercise per Session: 0 min  Stress: No Stress Concern Present (11/21/2022)   [place]    Feeling of Stress : Not at all  Social Connections: [place] (11/21/2022)   Social Connection and Isolation Panel [NHANES]    Frequency of Communication with Friends and Family: More than three times a week    Frequency of Social Gatherings with Friends and Family: Never    Attends Religious Services: More than 4 times per year    Active Member of [employer] or Organizations: Yes    Attends Music therapist: More than 4 times per year    Marital Status: Married  Human resources officer Violence: Not At Risk (11/21/2022)   Humiliation, Afraid, Rape, and Kick questionnaire    Fear of Current or Ex-Partner: No    Emotionally Abused: No    Physically Abused: No    Sexually Abused: No    Family History  Problem Relation Age of Onset   Heart disease Father    Hypertension Father    Breast cancer Sister    Cancer Sister        breast   Colon cancer Neg Hx    Esophageal cancer Neg Hx    Pancreatic cancer Neg Hx    Liver disease Neg Hx      Review of Systems  Constitutional: Negative.  Negative for chills and fever.  HENT: Negative.  Negative for congestion and sore throat.   Respiratory:  Positive for cough. Negative for hemoptysis, sputum production, shortness of breath and wheezing.   Cardiovascular: Negative.  Negative for chest pain and palpitations.  Gastrointestinal:  Negative for abdominal pain, diarrhea, nausea and  vomiting.  Genitourinary: Negative.   Skin:  Positive for itching and rash.       Chronic intermittent itchy rash  Neurological: Negative.  Negative for dizziness and headaches.  All other systems reviewed and are negative.   Today's Vitals   01/07/23 1022  BP: 126/70  Pulse: 62  Temp: 97.9 F (36.6 C)  TempSrc: Oral  SpO2: 98%  Weight: 268 lb (121.6 kg)  Height: 5\' 1"  (1.549 m)   Body mass index is 50.64 kg/m.   Physical Exam Constitutional:      Appearance: Normal appearance.  HENT:     Head: Normocephalic.  Eyes:     Extraocular Movements: Extraocular movements intact.  Cardiovascular:     Rate and Rhythm: Normal rate and regular rhythm.     Pulses: Normal pulses.     Heart sounds: Normal heart sounds.  Pulmonary:     Effort: Pulmonary effort is normal.     Breath sounds: Normal breath sounds.  Abdominal:     Palpations: Abdomen is soft.     Tenderness: There is no abdominal tenderness.  Skin:    General: Skin is warm and dry.  Neurological:     General: No focal deficit present.     Mental Status: She is alert and oriented to person, place, and time.  Psychiatric:        Mood and Affect: Mood normal.        Behavior: Behavior normal.      ASSESSMENT & PLAN: A total of 46 minutes was spent with the patient and counseling/coordination of care regarding preparing for this visit, review of most recent office visit notes, review of multiple chronic medical conditions under management, review of all medications,  recent diagnosis of COVID and treatment, cough management, prognosis, ED precautions, COVID instructions, documentation and need for follow-up.  Problem List Items Addressed This Visit       Cardiovascular and Mediastinum   Hypertension associated with diabetes ([place])    Well-controlled hypertension. Sugars have been elevated due to dexamethasone recently prescribed Advised to stop corticosteroid and continue present medications as directed by  endocrinologist      Relevant Medications   Continuous Blood Gluc Receiver (FREESTYLE LIBRE 2 READER) DEVI   Other Relevant Orders   POCT Glucose (CBG) (Completed)     Genitourinary   Stage 3b chronic kidney disease ([place])    Recently diagnosed and referred to nephrology.  Has not been contacted yet.  Given office information and phone number to contact. Advised to stay well-hydrated and avoid NSAIDs        Other   Acute cough    Cough management discussed Continue over-the-counter Mucinex DM May take Cheratussin as needed.  New prescription sent today Advised to stay well-hydrated      Relevant Medications   guaiFENesin-codeine (ROBITUSSIN AC) 100-10 MG/5ML syrup   COVID-19 virus infection - Primary    Recently diagnosed.  Husband also with COVID at home Clinically stable.  No complications.  Running its course as expected ED precautions given COVID instructions given. Advised to rest and stay well-hydrated.      Relevant Medications   guaiFENesin-codeine (ROBITUSSIN AC) 100-10 MG/5ML syrup   Patient Instructions  Stop dexamethasone pills Take Cheratussin for cough as needed Make sure to follow-up with nephrologist.  Cough, Adult A cough helps to clear your throat and lungs. It may be a sign of an illness or another condition. A short-term (acute) cough may last 2-3 weeks. A long-term (chronic) cough may last 8 or more weeks. Many things can cause a cough. They include: Illnesses such as: An infection in your throat or lungs. Asthma or other heart or lung problems. Gastroesophageal reflux. This is when acid comes back up from your stomach. Breathing in things that bother (irritate) your lungs. Allergies. Postnasal drip. This is when mucus runs down the back of your throat. Smoking. Some medicines. Follow these instructions at home: Medicines Take over-the-counter and prescription medicines only as told by your doctor. Talk with your doctor before you take  cough medicine (cough suppressants). Eating and drinking Do not drink alcohol. Do not drink caffeine. Drink enough fluid to keep your pee (urine) pale yellow. Lifestyle Stay away from cigarette smoke. Do not smoke or use any products that contain nicotine or tobacco. If you need help quitting, ask your doctor. Stay away from things that make you cough. These may include perfume, candles, cleaning products, or campfire smoke. General instructions  Watch for any changes to your cough. Tell your doctor about them. Always cover your mouth when you cough. If the air is dry in your home, use a cool mist vaporizer or humidifier. If your cough is worse at night, try using extra pillows to raise your head up higher while you sleep. Rest as needed. Contact a doctor if: You have new symptoms. Your symptoms get worse. You cough up pus. You have a fever that does not go away. Your cough does not get better after 2-3 weeks. Cough medicine does not help, and you are not sleeping well. You have pain that gets worse or is not helped with medicine. You are losing weight and do not know why. You have night sweats. Get help right away  if: You cough up blood. You have trouble breathing. Your heart is beating very fast. These symptoms may be an emergency. Get help right away. Call 911. Do not wait to see if the symptoms will go away. Do not drive yourself to the hospital. This information is not intended to replace advice given to you by your health care provider. Make sure you discuss any questions you have with your health care provider. Document Revised: 06/06/2022 Document Reviewed: 06/06/2022 Elsevier Patient Education  [place], MD [facility]

## 2023-01-07 NOTE — Assessment & Plan Note (Signed)
Cough management discussed Continue over-the-counter Mucinex DM May take Cheratussin as needed.  New prescription sent today Advised to stay well-hydrated

## 2023-01-07 NOTE — Telephone Encounter (Signed)
Patient this that she has been taking the Humalog 25 units breakfast and supper.  Patient is suppose to take [facility] today but thinks the itching in the armpits maybe from the shot. This would be her 3rd injection and she was not having this issue before that medication.

## 2023-01-07 NOTE — Telephone Encounter (Signed)
Patient advised to increase Humalog to 30 at breakfast and 30 at supper. Patient is on the way to pcp to be evaluate for the itching under her arms.

## 2023-01-08 ENCOUNTER — Other Ambulatory Visit: Payer: Self-pay | Admitting: *Deleted

## 2023-01-08 ENCOUNTER — Ambulatory Visit: Payer: Medicare Other | Admitting: Dietician

## 2023-01-08 ENCOUNTER — Other Ambulatory Visit: Payer: Self-pay | Admitting: Emergency Medicine

## 2023-01-08 DIAGNOSIS — E1159 Type 2 diabetes mellitus with other circulatory complications: Secondary | ICD-10-CM

## 2023-01-08 MED ORDER — FREESTYLE LIBRE 2 READER DEVI: 3 refills | Status: AC

## 2023-01-08 MED ORDER — HYDROXYZINE PAMOATE 25 MG PO CAPS: 25.0000 mg | ORAL_CAPSULE | Freq: Three times a day (TID) | ORAL | 3 refills | Status: DC | PRN

## 2023-01-08 NOTE — Telephone Encounter (Signed)
Pt called back saying she need her monitor Freestyle 2 to check her sugar levels and she was suppose to get prescribed some cream for itching.

## 2023-01-08 NOTE — Telephone Encounter (Signed)
Called patient and informed her of provider response.  

## 2023-01-08 NOTE — Telephone Encounter (Signed)
Oral medication for itching, hydroxyzine, sent to pharmacy of record

## 2023-01-15 ENCOUNTER — Ambulatory Visit (INDEPENDENT_AMBULATORY_CARE_PROVIDER_SITE_OTHER): Payer: Medicare Other | Admitting: Internal Medicine

## 2023-01-15 VITALS — BP 128/72 | HR 74 | Temp 98.2°F | Ht 61.0 in | Wt 267.0 lb

## 2023-01-15 DIAGNOSIS — R051 Acute cough: Secondary | ICD-10-CM | POA: Diagnosis not present

## 2023-01-15 DIAGNOSIS — I152 Hypertension secondary to endocrine disorders: Secondary | ICD-10-CM | POA: Diagnosis not present

## 2023-01-15 DIAGNOSIS — J4530 Mild persistent asthma, uncomplicated: Secondary | ICD-10-CM | POA: Diagnosis not present

## 2023-01-15 DIAGNOSIS — U071 COVID-19: Secondary | ICD-10-CM

## 2023-01-15 DIAGNOSIS — J309 Allergic rhinitis, unspecified: Secondary | ICD-10-CM

## 2023-01-15 DIAGNOSIS — Z794 Long term (current) use of insulin: Secondary | ICD-10-CM | POA: Diagnosis not present

## 2023-01-15 DIAGNOSIS — R21 Rash and other nonspecific skin eruption: Secondary | ICD-10-CM

## 2023-01-15 DIAGNOSIS — E1159 Type 2 diabetes mellitus with other circulatory complications: Secondary | ICD-10-CM | POA: Diagnosis not present

## 2023-01-15 DIAGNOSIS — E1165 Type 2 diabetes mellitus with hyperglycemia: Secondary | ICD-10-CM

## 2023-01-15 LAB — POC INFLUENZA A&B (BINAX/QUICKVUE)
Influenza A, POC: NEGATIVE
Influenza B, POC: NEGATIVE

## 2023-01-15 LAB — POC SOFIA SARS ANTIGEN FIA: SARS Coronavirus 2 Ag: NEGATIVE

## 2023-01-15 LAB — POCT RESPIRATORY SYNCYTIAL VIRUS: RSV Rapid Ag: NEGATIVE

## 2023-01-15 MED ORDER — PREDNISONE 10 MG PO TABS: ORAL_TABLET | ORAL | 0 refills | Status: DC

## 2023-01-15 MED ORDER — GUAIFENESIN-CODEINE 100-10 MG/5ML PO SYRP: 5.0000 mL | ORAL_SOLUTION | Freq: Three times a day (TID) | ORAL | 1 refills | Status: DC | PRN

## 2023-01-15 MED ORDER — METHYLPREDNISOLONE ACETATE 80 MG/ML IJ SUSP
80.0000 mg | Freq: Once | INTRAMUSCULAR | Status: AC
  Administered 2023-01-15: 80 mg via INTRAMUSCULAR

## 2023-01-15 MED ORDER — HYDROXYZINE PAMOATE 25 MG PO CAPS: 25.0000 mg | ORAL_CAPSULE | Freq: Three times a day (TID) | ORAL | 3 refills | Status: DC | PRN

## 2023-01-15 NOTE — Patient Instructions (Addendum)
Your testing for Covid, RSV and Flu are negative today  You had the steroid shot today for severe allergy flare up with rash to the arms and abdomen as well  Please take all new medication as prescribed - the prednisone  Ok to go up by another 5 units per insulin shot if the sugars are over 200  Please continue all other medications as before, and refills have been done for the cough medicine and hydroxyzine to be used as needed to feel better  Please have the pharmacy call with any other refills you may need.  Please continue your efforts at being more active, low cholesterol diet, and weight control.  Please keep your appointments with your specialists as you may have planned

## 2023-01-15 NOTE — Progress Notes (Signed)
Patient ID: [name], female   DOB: January 02, 1941, 82 y.o.   MRN: [id number]        Chief Complaint: follow up allergy rash reaction, allergy rhinitis, asthma, htn       HPI:  [name] is a 82 y.o. female here with c/o 3 wks onset itch and rash to arms and now worsening to abdomen as well with small swelling, but Pt denies chest pain, increased sob or doe, wheezing, orthopnea, PND, increased LE swelling, palpitations, dizziness or syncope.   Pt denies polydipsia, polyuria, or new focal neuro s/s.   Pt denies fever, wt loss, night sweats, loss of appetite, or other constitutional symptoms  Coid, flu and RSV negative today       Wt Readings from Last 3 Encounters:  01/15/23 267 lb (121.1 kg)  01/07/23 268 lb (121.6 kg)  01/05/23 270 lb (122.5 kg)   BP Readings from Last 3 Encounters:  01/15/23 128/72  01/07/23 126/70  01/05/23 112/66         Past Medical History:  Diagnosis Date   Arthritis    Back pain    Chronic anticoagulation    due to aflutter   Chronic kidney disease    Diabetes mellitus    Diastolic CHF, chronic ([place])    a.  echo 2006 - ef 55-65%; mild diast dysfxn;    b. Echo 08/2011: Mild LVH, EF 60%;  c. 04/2013 Echo: EF 65-69%, mild conc LVH;  08/2014 Echo: EF 60-65%, mild-mod MR.   Gout    Hyperlipidemia    Hypertension    a.  Renal arterial Dopplers 12/2011: 1-59% right renal artery stenosis   Morbid obesity ([place])    Obstructive sleep apnea on CPAP    Paroxysmal Afib/Flutter    a. 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   Past Surgical History:  Procedure Laterality Date   APPENDECTOMY     ATRIAL FLUTTER ABLATION N/A 09/24/2011   Procedure: ATRIAL FLUTTER ABLATION;  Surgeon: [name], MD;  Location: [facility] CATH LAB;  Service: Cardiovascular;  Laterality: N/A;   CARDIOVERSION  10/22/2011   Procedure: CARDIOVERSION;  Surgeon: [name], MD;  Location: [facility];  Service: Cardiovascular;  Laterality: N/A;   CARDIOVERSION N/A 09/10/2011   Procedure:  CARDIOVERSION;  Surgeon: [name], MD;  Location: [facility] CATH LAB;  Service: Cardiovascular;  Laterality: N/A;   CHOLECYSTECTOMY     COLONOSCOPY WITH PROPOFOL N/A 06/13/2021   Procedure: COLONOSCOPY WITH PROPOFOL;  Surgeon: Milus Banister, MD;  Location: [facility] ENDOSCOPY;  Service: Endoscopy;  Laterality: N/A;   POLYPECTOMY  06/13/2021   Procedure: POLYPECTOMY;  Surgeon: Milus Banister, MD;  Location: [facility] ENDOSCOPY;  Service: Endoscopy;;   [place]      reports that she has never smoked. She has never used smokeless tobacco. She reports that she does not drink alcohol and does not use drugs. family history includes Breast cancer in her sister; Cancer in her sister; Heart disease in her father; Hypertension in her father. Allergies  Allergen Reactions   Doxycycline     Made patient feel very ill    Metformin Diarrhea   Current Outpatient Medications on File Prior to Visit  Medication Sig Dispense Refill   Accu-Chek Softclix Lancets lancets 1 each by Other route 3 (three) times daily. as directed 100 each 3   allopurinol (ZYLOPRIM) 300 MG tablet TAKE 1 TABLET BY MOUTH DAILY 100 tablet 2   apixaban (ELIQUIS) 5  MG TABS tablet TAKE 1 TABLET BY MOUTH TWICE  DAILY 200 tablet 1   azelastine (ASTELIN) 0.1 % nasal spray 1-2 puffs each nostril at bedtime as needed 30 mL 12   b complex vitamins capsule Take 1 capsule by mouth daily.     BD PEN NEEDLE NANO 2ND GEN 32G X 4 MM MISC USE TWICE DAILY AS NEEDED 100 each 11   blood glucose meter kit and supplies Dispense based on patient and insurance preference. Use up to four times daily as directed. (FOR ICD-10 E10.9, E11.9). 1 each 0   cholecalciferol (VITAMIN D3) 25 MCG (1000 UNIT) tablet Take 1,000 Units by mouth daily.     clobetasol cream (TEMOVATE) [id number] % Apply 1 Application topically 2 (two) times daily. 30 g 1   cloNIDine (CATAPRES) 0.3 MG tablet TAKE 1 TABLET BY MOUTH TWICE  DAILY 200 tablet 2    Continuous Blood Gluc Receiver (FREESTYLE LIBRE 2 READER) DEVI Use to check blood glucose. Use as directed 1 each 3   Continuous Blood Gluc Sensor (FREESTYLE LIBRE 2 SENSOR) MISC USE AS DIRECTED TO CHECK BLOOD  SUGAR DAILY 8 each 2   dronedarone (MULTAQ) 400 MG tablet Take 1 tablet (400 mg total) by mouth 2 (two) times daily. 180 tablet 3   Glucagon (GVOKE HYPOPEN 2-PACK) 0.5 MG/0.1ML SOAJ Inject 0.5 mg into the skin daily as needed. For Hypoglycemic events 0.2 mL 5   HYDROcodone-acetaminophen (NORCO) 5-325 MG tablet Take 1 tablet by mouth every 6 (six) hours as needed for moderate pain. 30 tablet 0   hydrOXYzine (ATARAX) 10 MG tablet Take 1 tablet (10 mg total) by mouth every 6 (six) hours as needed. 30 tablet 1   Insulin Lispro Prot & Lispro (HUMALOG MIX 75/25 KWIKPEN) (75-25) 100 UNIT/ML Kwikpen Inject 26 Units into the skin 2 (two) times daily before a meal. 60 mL 2   ipratropium (ATROVENT HFA) 17 MCG/ACT inhaler Inhale 2 puffs into the lungs every 6 (six) hours as needed for wheezing. 1 each 2   ketorolac (ACULAR) 0.5 % ophthalmic solution SMARTSIG:In Eye(s)     levothyroxine (SYNTHROID) 112 MCG tablet Take 1 tablet (112 mcg total) by mouth daily. 90 tablet 3   lidocaine (LIDODERM) 5 % Place 1 patch onto the skin daily. Remove & Discard patch within 12 hours or as directed by MD 30 patch 0   losartan (COZAAR) 100 MG tablet TAKE 1 TABLET BY MOUTH DAILY 70 tablet 4   metoprolol tartrate (LOPRESSOR) 25 MG tablet Take 1 tablet (25 mg total) by mouth 2 (two) times daily. 180 tablet 3   nystatin powder Apply 1 Application topically 2 (two) times daily. 60 g 7   ONETOUCH VERIO test strip 1 each by Other route 3 (three) times daily.     RESTASIS MULTIDOSE 0.05 % ophthalmic emulsion Place 1 drop into both eyes 2 (two) times daily.     rosuvastatin (CRESTOR) 10 MG tablet TAKE 1 TABLET BY MOUTH IN  THE EVENING 90 tablet 3   tirzepatide (MOUNJARO) 5 MG/0.5ML Pen Inject 5 mg into the skin once a week. 6 mL  3   TOBRADEX ophthalmic ointment      torsemide (DEMADEX) 20 MG tablet Take 1 tablet (20 mg total) by mouth daily.     triamcinolone cream (KENALOG) 0.1 % Apply 1 application topically 2 (two) times daily. 30 g 0   vitamin B-12 (CYANOCOBALAMIN) 1000 MCG tablet Take 1,000 mcg by mouth daily.     [DISCONTINUED]  potassium chloride (KLOR-CON) 10 MEQ tablet Take 4 tablets (40 mEq total) by mouth daily. 120 tablet 0   No current facility-administered medications on file prior to visit.        ROS:  All others reviewed and negative.  Objective        PE:  BP 128/72   Pulse 74   Temp 98.2 F (36.8 C) (Oral)   Ht 5\' 1"  (1.549 m)   Wt 267 lb (121.1 kg)   SpO2 96%   BMI 50.45 kg/m                 Constitutional: Pt appears in NAD               HENT: Head: NCAT.                Right Ear: External ear normal.                 Left Ear: External ear normal.                Eyes: . Pupils are equal, round, and reactive to light. Conjunctivae and EOM are normal               Nose: without d/c or deformity               Neck: Neck supple. Gross normal ROM               Cardiovascular: Normal rate and regular rhythm.                 Pulmonary/Chest: Effort normal and breath sounds without rales or wheezing.                Abd:  Soft, NT, ND, + BS, no organomegaly               Neurological: Pt is alert. At baseline orientation, motor grossly intact               Skin: Skin is warm., LE edema - none, pruritic nontender erythem rash noted to post arms below the elbows and abdomen as well               Psychiatric: Pt behavior is normal without agitation   Micro: none  Cardiac tracings I have personally interpreted today:  none  Pertinent Radiological findings (summarize): none   Lab Results  Component Value Date   WBC 15.9 (H) 12/30/2022   HGB 11.3 (L) 12/30/2022   HCT 35.4 (L) 12/30/2022   PLT 347.0 12/30/2022   GLUCOSE 169 (H) 12/30/2022   CHOL 136 05/29/2022   TRIG 79.0 05/29/2022    HDL 75.80 05/29/2022   LDLCALC 44 05/29/2022   ALT 14 12/30/2022   AST 15 12/30/2022   NA 132 (L) 12/30/2022   K 5.6 (H) 12/30/2022   CL 99 12/30/2022   CREATININE 1.59 (H) 12/30/2022   BUN 52 (H) 12/30/2022   CO2 23 12/30/2022   TSH 9.87 (H) 12/30/2022   INR 2.0 06/15/2020   HGBA1C 8.9 (A) 12/12/2022   MICROALBUR 6.3 (H) 06/21/2020   POCT - Covid - neg, RSV - neg, Flu - neg  Assessment/Plan:  [name] is a 82 y.o. Other or two or more races [6] female with  has a past medical history of Arthritis, Back pain, Chronic anticoagulation, Chronic kidney disease, Diabetes mellitus, Diastolic CHF, chronic ([place]), Gout, Hyperlipidemia, Hypertension, Morbid obesity ([place]), Obstructive sleep apnea on CPAP, and  Paroxysmal Afib/Flutter.  Hypertension associated with diabetes ([place]) BP Readings from Last 3 Encounters:  01/15/23 128/72  01/07/23 126/70  01/05/23 112/66   Stable, pt to continue medical treatment catapres 0.3 bid, losartan 100 qd, lopressor 25 bid   Allergic rhinitis Mild, also for improvement with prednisone taper  Asthma Stable overall, to continue inhaler prn  COVID-19 virus infection Resolved, continue to follow  Diabetes [facility]) Lab Results  Component Value Date   HGBA1C 8.9 (A) 12/12/2022   Uncontrolled, for improved diet, continue humalog 75.25  - 26 u bid as delcines any change for now except to increase the units by 5 units for CBG > 200 with prednisone tx    Rash and nonspecific skin eruption C/w allergic rash, to continue hydroxyzine prn, but also depomedrol IM 80 mg, and prednisone taper,  to f/u any worsening symptoms or concerns  Followup: Return if symptoms worsen or fail to improve.  Cathlean Cower, MD 01/17/2023 8:51 PM [facility]

## 2023-01-17 ENCOUNTER — Encounter: Payer: Self-pay | Admitting: Internal Medicine

## 2023-01-17 NOTE — Assessment & Plan Note (Signed)
Stable overall, to continue inhaler prn °

## 2023-01-17 NOTE — Assessment & Plan Note (Signed)
Mild, also for improvement with prednisone taper

## 2023-01-17 NOTE — Assessment & Plan Note (Signed)
Resolved, continue to follow 

## 2023-01-17 NOTE — Assessment & Plan Note (Signed)
BP Readings from Last 3 Encounters:  01/15/23 128/72  01/07/23 126/70  01/05/23 112/66   Stable, pt to continue medical treatment catapres 0.3 bid, losartan 100 qd, lopressor 25 bid

## 2023-01-17 NOTE — Assessment & Plan Note (Signed)
Lab Results  Component Value Date   HGBA1C 8.9 (A) 12/12/2022   Uncontrolled, for improved diet, continue humalog 75.25  - 26 u bid as delcines any change for now except to increase the units by 5 units for CBG > 200 with prednisone tx

## 2023-01-17 NOTE — Assessment & Plan Note (Signed)
C/w allergic rash, to continue hydroxyzine prn, but also depomedrol IM 80 mg, and prednisone taper,  to f/u any worsening symptoms or concerns

## 2023-01-19 ENCOUNTER — Other Ambulatory Visit: Payer: Self-pay | Admitting: Internal Medicine

## 2023-01-19 ENCOUNTER — Telehealth: Payer: Self-pay | Admitting: Internal Medicine

## 2023-01-19 ENCOUNTER — Other Ambulatory Visit: Payer: Self-pay

## 2023-01-19 MED ORDER — TORSEMIDE 20 MG PO TABS: 20.0000 mg | ORAL_TABLET | Freq: Every day | ORAL | 2 refills | Status: DC

## 2023-01-19 MED ORDER — DRONEDARONE HCL 400 MG PO TABS: 400.0000 mg | ORAL_TABLET | Freq: Two times a day (BID) | ORAL | 2 refills | Status: DC

## 2023-01-19 NOTE — Telephone Encounter (Signed)
*  STAT* If patient is at the pharmacy, call can be transferred to refill team.   1. Which medications need to be refilled? (please list name of each medication and dose if known) torsemide (DEMADEX) 20 MG tablet  2. Which pharmacy/location (including street and city if local pharmacy) is medication to be sent to? [facility], [place] AT [address]   3. Do they need a 30 day or 90 day supply?  90 day supply

## 2023-01-19 NOTE — Telephone Encounter (Signed)
Pt's medications were sent to pt's pharmacy as requested. Confirmation received.  

## 2023-01-19 NOTE — Telephone Encounter (Signed)
Pt's medication were sent to pt's pharmacy as requested. Confirmation received.  

## 2023-01-28 ENCOUNTER — Encounter: Payer: Self-pay | Admitting: Emergency Medicine

## 2023-01-28 ENCOUNTER — Ambulatory Visit (INDEPENDENT_AMBULATORY_CARE_PROVIDER_SITE_OTHER): Payer: Medicare Other

## 2023-01-28 ENCOUNTER — Telehealth: Payer: Self-pay | Admitting: Emergency Medicine

## 2023-01-28 ENCOUNTER — Ambulatory Visit (INDEPENDENT_AMBULATORY_CARE_PROVIDER_SITE_OTHER): Payer: Medicare Other | Admitting: Emergency Medicine

## 2023-01-28 VITALS — BP 122/68 | HR 75 | Temp 98.2°F | Ht 61.0 in | Wt 267.5 lb

## 2023-01-28 DIAGNOSIS — R053 Chronic cough: Secondary | ICD-10-CM | POA: Diagnosis not present

## 2023-01-28 DIAGNOSIS — R079 Chest pain, unspecified: Secondary | ICD-10-CM | POA: Diagnosis not present

## 2023-01-28 DIAGNOSIS — R059 Cough, unspecified: Secondary | ICD-10-CM | POA: Diagnosis not present

## 2023-01-28 DIAGNOSIS — J22 Unspecified acute lower respiratory infection: Secondary | ICD-10-CM | POA: Insufficient documentation

## 2023-01-28 MED ORDER — AMOXICILLIN-POT CLAVULANATE 875-125 MG PO TABS
1.0000 | ORAL_TABLET | Freq: Two times a day (BID) | ORAL | 0 refills | Status: AC
Start: 2023-01-28 — End: 2023-02-04

## 2023-01-28 MED ORDER — MUCINEX DM MAXIMUM STRENGTH 60-1200 MG PO TB12
1.0000 | ORAL_TABLET | Freq: Two times a day (BID) | ORAL | 1 refills | Status: AC
Start: 2023-01-28 — End: 2023-02-07

## 2023-01-28 NOTE — Assessment & Plan Note (Addendum)
Clinically stable.  No red flag signs or symptoms No pneumonia on chest x-ray X-ray does not show congestive heart failure or pneumonia.  No effusions or suspicious infiltrates. Recommend to start Augmentin 875 mg twice a day for 7 days

## 2023-01-28 NOTE — Assessment & Plan Note (Signed)
Cough management discussed Recommend to start over-the-counter Mucinex DM and cough drops Continue Cheratussin cough syrup as needed Advised to rest and stay well-hydrated

## 2023-01-28 NOTE — Telephone Encounter (Signed)
Chest x-ray report reviewed with patient No pneumonia or CHF.

## 2023-01-28 NOTE — Patient Instructions (Signed)
Cough, Adult A cough helps to clear your throat and lungs. It may be a sign of an illness or another condition. A short-term (acute) cough may last 2-3 weeks. A long-term (chronic) cough may last 8 or more weeks. Many things can cause a cough. They include: Illnesses such as: An infection in your throat or lungs. Asthma or other heart or lung problems. Gastroesophageal reflux. This is when acid comes back up from your stomach. Breathing in things that bother (irritate) your lungs. Allergies. Postnasal drip. This is when mucus runs down the back of your throat. Smoking. Some medicines. Follow these instructions at home: Medicines Take over-the-counter and prescription medicines only as told by your doctor. Talk with your doctor before you take cough medicine (cough suppressants). Eating and drinking Do not drink alcohol. Do not drink caffeine. Drink enough fluid to keep your pee (urine) pale yellow. Lifestyle Stay away from cigarette smoke. Do not smoke or use any products that contain nicotine or tobacco. If you need help quitting, ask your doctor. Stay away from things that make you cough. These may include perfume, candles, cleaning products, or campfire smoke. General instructions  Watch for any changes to your cough. Tell your doctor about them. Always cover your mouth when you cough. If the air is dry in your home, use a cool mist vaporizer or humidifier. If your cough is worse at night, try using extra pillows to raise your head up higher while you sleep. Rest as needed. Contact a doctor if: You have new symptoms. Your symptoms get worse. You cough up pus. You have a fever that does not go away. Your cough does not get better after 2-3 weeks. Cough medicine does not help, and you are not sleeping well. You have pain that gets worse or is not helped with medicine. You are losing weight and do not know why. You have night sweats. Get help right away if: You cough up  blood. You have trouble breathing. Your heart is beating very fast. These symptoms may be an emergency. Get help right away. Call 911. Do not wait to see if the symptoms will go away. Do not drive yourself to the hospital. This information is not intended to replace advice given to you by your health care provider. Make sure you discuss any questions you have with your health care provider. Document Revised: 06/06/2022 Document Reviewed: 06/06/2022 Elsevier Patient Education  2023 [employer]  

## 2023-01-28 NOTE — Progress Notes (Signed)
[name] 82 y.o.   Chief Complaint  Patient presents with   Cough     Cough, Chest tightness, SOB x 3 1/2 weeks     HISTORY OF PRESENT ILLNESS: This is a 82 y.o. female complaining of persistent cough since diagnosed with COVID last month Denies fever or chills.  Occasional chest tightness with shortness of breath Dry cough.  Cough Associated symptoms include shortness of breath. Pertinent negatives include no chest pain, chills, fever, headaches, hemoptysis, rash, sore throat or wheezing.     Prior to Admission medications   Medication Sig Start Date End Date Taking? Authorizing Provider  Accu-Chek Softclix Lancets lancets 1 each by Other route 3 (three) times daily. as directed 03/30/20  Yes Janeece Agee, NP  allopurinol (ZYLOPRIM) 300 MG tablet TAKE 1 TABLET BY MOUTH DAILY 11/13/22  Yes Dezra Mandella, Eilleen Kempf, MD  apixaban (ELIQUIS) 5 MG TABS tablet TAKE 1 TABLET BY MOUTH TWICE  DAILY 11/10/22  Yes Duke Salvia, MD  azelastine (ASTELIN) 0.1 % nasal spray 1-2 puffs each nostril at bedtime as needed 01/02/22  Yes Young, Joni Fears D, MD  b complex vitamins capsule Take 1 capsule by mouth daily.   Yes [provider]  BD PEN NEEDLE NANO 2ND GEN 32G X 4 MM MISC USE TWICE DAILY AS NEEDED 08/02/22  Yes Nya Monds, Eilleen Kempf, MD  blood glucose meter kit and supplies Dispense based on patient and insurance preference. Use up to four times daily as directed. (FOR ICD-10 E10.9, E11.9). 08/29/21  Yes Leba Tibbitts, Eilleen Kempf, MD  cholecalciferol (VITAMIN D3) 25 MCG (1000 UNIT) tablet Take 1,000 Units by mouth daily.   Yes [provider]  clobetasol cream (TEMOVATE) 0.05 % Apply 1 Application topically 2 (two) times daily. 06/05/22  Yes Mychal Durio, Eilleen Kempf, MD  cloNIDine (CATAPRES) 0.3 MG tablet TAKE 1 TABLET BY MOUTH TWICE  DAILY 11/06/22  Yes Clance Baquero, Eilleen Kempf, MD  Continuous Blood Gluc Receiver (FREESTYLE LIBRE 2 READER) DEVI Use to check blood glucose. Use as  directed 01/08/23  Yes Rodgerick Gilliand, Eilleen Kempf, MD  Continuous Blood Gluc Sensor (FREESTYLE LIBRE 2 SENSOR) MISC USE AS DIRECTED TO CHECK BLOOD  SUGAR DAILY 04/06/22  Yes Georgina Quint, MD  dronedarone (MULTAQ) 400 MG tablet Take 1 tablet (400 mg total) by mouth 2 (two) times daily. 01/19/23  Yes Duke Salvia, MD  Glucagon (GVOKE HYPOPEN 2-PACK) 0.5 MG/0.1ML SOAJ Inject 0.5 mg into the skin daily as needed. For Hypoglycemic events 05/30/22  Yes Sharad Vaneaton, Eilleen Kempf, MD  guaiFENesin-codeine [facility]) 100-10 MG/5ML syrup Take 5 mLs by mouth 3 (three) times daily as needed for cough. 01/15/23  Yes Corwin Levins, MD  HYDROcodone-acetaminophen (NORCO) 5-325 MG tablet Take 1 tablet by mouth every 6 (six) hours as needed for moderate pain. 08/12/22  Yes Lundyn Coste, Eilleen Kempf, MD  hydrOXYzine (ATARAX) 10 MG tablet Take 1 tablet (10 mg total) by mouth every 6 (six) hours as needed. 08/12/22  Yes Kyden Potash, Eilleen Kempf, MD  hydrOXYzine (VISTARIL) 25 MG capsule Take 1 capsule (25 mg total) by mouth every 8 (eight) hours as needed. 01/15/23  Yes Corwin Levins, MD  Insulin Lispro Prot & Lispro (HUMALOG MIX 75/25 KWIKPEN) (75-25) 100 UNIT/ML Kwikpen Inject 26 Units into the skin 2 (two) times daily before a meal. 12/12/22  Yes [name], Konrad Dolores, MD  ketorolac (ACULAR) 0.5 % ophthalmic solution SMARTSIG:In Eye(s) 08/22/21  Yes [provider]  levothyroxine (SYNTHROID) 112 MCG tablet Take 1 tablet (112  mcg total) by mouth daily. 12/31/22  Yes Peggi Yono, Eilleen Kempf, MD  lidocaine (LIDODERM) 5 % Place 1 patch onto the skin daily. Remove & Discard patch within 12 hours or as directed by MD 08/29/20  Yes Kathryne Hitch, MD  losartan (COZAAR) 100 MG tablet TAKE 1 TABLET BY MOUTH DAILY 10/13/22  Yes Laqueshia Cihlar, Eilleen Kempf, MD  metoprolol tartrate (LOPRESSOR) 25 MG tablet Take 1 tablet (25 mg total) by mouth 2 (two) times daily. 10/29/22 10/24/23 Yes Keita [place], Eilleen Kempf, MD  nystatin powder  Apply 1 Application topically 2 (two) times daily. 08/12/22  Yes Ignazio Kincaid, Eilleen Kempf, MD  [facility] VERIO test strip 1 each by Other route 3 (three) times daily. 06/08/20  Yes [provider]  predniSONE (DELTASONE) 10 MG tablet 3 tabs by mouth per day for 3 days,2tabs per day for 3 days,1tab per day for 3 days 01/15/23  Yes Corwin Levins, MD  RESTASIS MULTIDOSE 0.05 % ophthalmic emulsion Place 1 drop into both eyes 2 (two) times daily. 03/04/20  Yes [provider]  rosuvastatin (CRESTOR) 10 MG tablet TAKE 1 TABLET BY MOUTH IN  THE EVENING 04/03/22  Yes Petrona Wyeth, [place], MD  tirzepatide [facility]) 5 MG/0.5ML Pen Inject 5 mg into the skin once a week. 12/12/22  Yes [name], Konrad Dolores, MD  TOBRADEX ophthalmic ointment  08/25/21  Yes [provider]  torsemide (DEMADEX) 20 MG tablet Take 1 tablet (20 mg total) by mouth daily. 01/19/23  Yes Duke Salvia, MD  triamcinolone cream (KENALOG) 0.1 % Apply 1 application topically 2 (two) times daily. 07/26/20  Yes Traeger Sultana, Eilleen Kempf, MD  vitamin B-12 (CYANOCOBALAMIN) 1000 MCG tablet Take 1,000 mcg by mouth daily.   Yes [provider]  ipratropium (ATROVENT HFA) 17 MCG/ACT inhaler Inhale 2 puffs into the lungs every 6 (six) hours as needed for wheezing. 12/17/21 12/17/22  Waymon Budge, MD  potassium chloride (KLOR-CON) 10 MEQ tablet Take 4 tablets (40 mEq total) by mouth daily. 12/10/21 03/06/22  Graciella Freer, PA-C    Allergies  Allergen Reactions   Doxycycline     Made patient feel very ill    Metformin Diarrhea    Patient Active Problem List   Diagnosis Date Noted   Allergic rhinitis 01/15/2023   COVID-19 virus infection 01/07/2023   Stage 3b chronic kidney disease 01/07/2023   Right hip pain 08/13/2022   Primary hyperaldosteronism 03/06/2022   Coronary artery disease involving native coronary artery of native heart without angina pectoris 02/12/2022   Adrenal adenoma, right 02/07/2022    Skin lesion of right arm 02/02/2022   Asthma 11/01/2021   Normocytic anemia 10/11/2021   Nodule of right lung 09/30/2021   Acute cough 09/27/2021   Dysthymia 03/12/2021   Primary osteoarthritis involving multiple joints 03/01/2021   Recurrent falls 01/10/2021   Sensorineural hearing loss (SNHL), 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   Persistent atrial fibrillation 08/12/2020   Type 2 diabetes mellitus with hyperglycemia, with long-term current use of insulin 06/21/2020   Dyslipidemia associated with type 2 diabetes mellitus 06/21/2020   Mixed hyperlipidemia 06/21/2020   Uncontrolled hypertension 06/11/2020   Aortic atherosclerosis 03/28/2020   Diabetic peripheral neuropathy 12/26/2019   Gastroesophageal reflux disease without esophagitis 03/17/2019   Rash and nonspecific skin eruption 03/17/2019   Pruritus 02/16/2019   Varicose veins of both legs with edema 02/16/2019   Heart murmur 09/05/2018   Restrictive lung disease 02/22/2018   Dysgeusia 06/11/2017   Osteoarthritis of left knee 06/03/2017   NAFLD (  nonalcoholic fatty liver disease) 16/07/9603   Osteoporosis 12/31/2015   Obesity 12/19/2015   Acquired hypothyroidism 10/18/2015   Prolonged QT interval 10/03/2013   CHF (congestive heart failure) 04/21/2013   Long term (current) use of anticoagulants 06/09/2012   Musculoskeletal back pain    BENIGN NEOPLASM OF ADRENAL GLAND 11/25/2010   Chronic diastolic heart failure 02/20/2010   Diabetes 05/16/2009   GOUT 05/16/2009   OBESITY, MORBID 05/16/2009   Hypertension associated with diabetes 05/16/2009   Paroxysmal atrial fibrillation 05/16/2009   HYPERLIPIDEMIA 11/30/2008   Obstructive sleep apnea on CPAP 11/30/2008    Past Medical History:  Diagnosis Date   Arthritis    Back pain    Chronic anticoagulation    due to aflutter   Chronic kidney disease    Diabetes mellitus    Diastolic CHF, chronic (HCC)    a.  echo 2006 - ef 55-65%; mild diast dysfxn;    b. Echo 08/2011: Mild LVH, EF  60%;  c. 04/2013 Echo: EF 65-69%, mild conc LVH;  08/2014 Echo: EF 60-65%, mild-mod MR.   Gout    Hyperlipidemia    Hypertension    a.  Renal arterial Dopplers 12/2011: 1-59% right renal artery stenosis   Morbid obesity (HCC)    Obstructive sleep apnea on CPAP    Paroxysmal Afib/Flutter    a. 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    Past Surgical History:  Procedure Laterality Date   APPENDECTOMY     ATRIAL FLUTTER ABLATION N/A 09/24/2011   Procedure: ATRIAL FLUTTER ABLATION;  Surgeon: Marinus Maw, MD;  Location: [facility] CATH LAB;  Service: Cardiovascular;  Laterality: N/A;   CARDIOVERSION  10/22/2011   Procedure: CARDIOVERSION;  Surgeon: Duke Salvia, MD;  Location: [facility] OR;  Service: Cardiovascular;  Laterality: N/A;   CARDIOVERSION N/A 09/10/2011   Procedure: CARDIOVERSION;  Surgeon: Duke Salvia, MD;  Location: [facility] CATH LAB;  Service: Cardiovascular;  Laterality: N/A;   CHOLECYSTECTOMY     COLONOSCOPY WITH PROPOFOL N/A 06/13/2021   Procedure: COLONOSCOPY WITH PROPOFOL;  Surgeon: Rachael Fee, MD;  Location: [facility] ENDOSCOPY;  Service: Endoscopy;  Laterality: N/A;   POLYPECTOMY  06/13/2021   Procedure: POLYPECTOMY;  Surgeon: Rachael Fee, MD;  Location: [facility] ENDOSCOPY;  Service: Endoscopy;;   TONSILLECTOMY  1982   TOTAL ABDOMINAL HYSTERECTOMY      Social History   Socioeconomic History   Marital status: Married    Spouse name: Not on file   Number of children: 3   Years of education: Not on file   Highest education level: Not on file  Occupational History   Occupation: DISABILITY/housewife    Employer: RETIRED  Tobacco Use   Smoking status: Never   Smokeless tobacco: Never  Vaping Use   Vaping Use: Never used  Substance and Sexual Activity   Alcohol use: No   Drug use: No   Sexual activity: Yes  Other Topics Concern   Not on file  Social History Narrative   Not on file   Social Determinants of Health   Financial Resource Strain: Low Risk  (11/21/2022)   Overall  Financial Resource Strain (CARDIA)    Difficulty of Paying Living Expenses: Not hard at all  Food Insecurity: No Food Insecurity (11/21/2022)   Hunger Vital Sign    Worried About Running Out of Food in the Last Year: Never true    Ran Out of Food in the Last Year: Never true  Transportation Needs: No Transportation Needs (11/21/2022)  PRAPARE - Administrator, Civil ServiceTransportation    Lack of Transportation (Medical): No    Lack of Transportation (Non-Medical): No  Physical Activity: Inactive (11/21/2022)   Exercise Vital Sign    Days of Exercise per Week: 0 days    Minutes of Exercise per Session: 0 min  Stress: No Stress Concern Present (11/21/2022)   [employer] - Occupational Stress Questionnaire    Feeling of Stress : Not at all  Social Connections: Socially Integrated (11/21/2022)   Social Connection and Isolation Panel [NHANES]    Frequency of Communication with Friends and Family: More than three times a week    Frequency of Social Gatherings with Friends and Family: Never    Attends Religious Services: More than 4 times per year    Active Member of [employer]ubs or Organizations: Yes    Attends Engineer, structuralClub or Organization Meetings: More than 4 times per year    Marital Status: Married  Catering managerntimate Partner Violence: Not At Risk (11/21/2022)   Humiliation, Afraid, Rape, and Kick questionnaire    Fear of Current or Ex-Partner: No    Emotionally Abused: No    Physically Abused: No    Sexually Abused: No    Family History  Problem Relation Age of Onset   Heart disease Father    Hypertension Father    Breast cancer Sister    Cancer Sister        breast   Colon cancer Neg Hx    Esophageal cancer Neg Hx    Pancreatic cancer Neg Hx    Liver disease Neg Hx      Review of Systems  Constitutional: Negative.  Negative for chills and fever.  HENT:  Positive for congestion. Negative for sore throat.   Respiratory:  Positive for cough and shortness of breath. Negative for hemoptysis, sputum  production and wheezing.   Cardiovascular:  Negative for chest pain and palpitations.  Gastrointestinal: Negative.  Negative for abdominal pain, nausea and vomiting.  Skin: Negative.  Negative for rash.  Neurological: Negative.  Negative for dizziness and headaches.  All other systems reviewed and are negative.   Vitals:   01/28/23 1351  BP: 122/68  Pulse: 75  Temp: 98.2 F (36.8 C)  SpO2: 97%    Physical Exam Vitals reviewed.  Constitutional:      Appearance: Normal appearance.  HENT:     Head: Normocephalic.     Mouth/Throat:     Mouth: Mucous membranes are moist.     Pharynx: Oropharynx is clear.  Eyes:     Extraocular Movements: Extraocular movements intact.     Pupils: Pupils are equal, round, and reactive to light.  Cardiovascular:     Rate and Rhythm: Normal rate and regular rhythm.     Pulses: Normal pulses.     Heart sounds: Normal heart sounds.  Pulmonary:     Effort: Pulmonary effort is normal.     Breath sounds: Normal breath sounds.  Musculoskeletal:     Cervical back: No tenderness.  Lymphadenopathy:     Cervical: No cervical adenopathy.  Skin:    General: Skin is warm and dry.     Capillary Refill: Capillary refill takes less than 2 seconds.  Neurological:     General: No focal deficit present.     Mental Status: She is alert and oriented to person, place, and time.  Psychiatric:        Mood and Affect: Mood normal.        Behavior: Behavior normal.  DG Chest 2 View  Result Date: 01/28/2023 CLINICAL DATA:  Persistent cough, chest pain EXAM: CHEST - 2 VIEW COMPARISON:  Previous studies including the examination of 10/31/2021 FINDINGS: Transverse diameter of heart is increased. There are no signs of pulmonary edema or focal pulmonary consolidation. There is no pleural effusion or pneumothorax. IMPRESSION: There are no signs of pulmonary edema or focal pulmonary consolidation. Electronically Signed   By: Ernie Avena M.D.   On: 01/28/2023  14:34     ASSESSMENT & PLAN: A total of 44 minutes was spent with the patient and counseling/coordination of care regarding preparing for this visit, review of most recent office visit notes, review of multiple chronic medical conditions under management, review of all medications, review of today's chest x-ray report, diagnosis of lower respiratory infection and need for antibiotics, cough management, prognosis, documentation, need for follow-up.  Problem List Items Addressed This Visit       Respiratory   Lower respiratory infection - Primary    Clinically stable.  No red flag signs or symptoms No pneumonia on chest x-ray X-ray does not show congestive heart failure or pneumonia.  No effusions or suspicious infiltrates. Recommend to start Augmentin 875 mg twice a day for 7 days      Relevant Medications   amoxicillin-clavulanate (AUGMENTIN) 875-125 MG tablet   Other Relevant Orders   DG Chest 2 View (Completed)     Other   Persistent cough    Cough management discussed Recommend to start over-the-counter Mucinex DM and cough drops Continue Cheratussin cough syrup as needed Advised to rest and stay well-hydrated      Relevant Medications   Dextromethorphan-guaiFENesin (MUCINEX DM MAXIMUM STRENGTH) 60-1200 MG TB12   Other Relevant Orders   DG Chest 2 View (Completed)   Patient Instructions  Cough, Adult A cough helps to clear your throat and lungs. It may be a sign of an illness or another condition. A short-term (acute) cough may last 2-3 weeks. A long-term (chronic) cough may last 8 or more weeks. Many things can cause a cough. They include: Illnesses such as: An infection in your throat or lungs. Asthma or other heart or lung problems. Gastroesophageal reflux. This is when acid comes back up from your stomach. Breathing in things that bother (irritate) your lungs. Allergies. Postnasal drip. This is when mucus runs down the back of your throat. Smoking. Some  medicines. Follow these instructions at home: Medicines Take over-the-counter and prescription medicines only as told by your doctor. Talk with your doctor before you take cough medicine (cough suppressants). Eating and drinking Do not drink alcohol. Do not drink caffeine. Drink enough fluid to keep your pee (urine) pale yellow. Lifestyle Stay away from cigarette smoke. Do not smoke or use any products that contain nicotine or tobacco. If you need help quitting, ask your doctor. Stay away from things that make you cough. These may include perfume, candles, cleaning products, or campfire smoke. General instructions  Watch for any changes to your cough. Tell your doctor about them. Always cover your mouth when you cough. If the air is dry in your home, use a cool mist vaporizer or humidifier. If your cough is worse at night, try using extra pillows to raise your head up higher while you sleep. Rest as needed. Contact a doctor if: You have new symptoms. Your symptoms get worse. You cough up pus. You have a fever that does not go away. Your cough does not get better after 2-3 weeks. Cough  medicine does not help, and you are not sleeping well. You have pain that gets worse or is not helped with medicine. You are losing weight and do not know why. You have night sweats. Get help right away if: You cough up blood. You have trouble breathing. Your heart is beating very fast. These symptoms may be an emergency. Get help right away. Call 911. Do not wait to see if the symptoms will go away. Do not drive yourself to the hospital. This information is not intended to replace advice given to you by your health care provider. Make sure you discuss any questions you have with your health care provider. Document Revised: 06/06/2022 Document Reviewed: 06/06/2022 Elsevier Patient Education  2023 Elsevier Inc.    Edwina Barth, MD [facility]

## 2023-02-03 ENCOUNTER — Other Ambulatory Visit: Payer: Self-pay | Admitting: Emergency Medicine

## 2023-02-03 DIAGNOSIS — Z1231 Encounter for screening mammogram for malignant neoplasm of breast: Secondary | ICD-10-CM

## 2023-02-09 ENCOUNTER — Ambulatory Visit (INDEPENDENT_AMBULATORY_CARE_PROVIDER_SITE_OTHER): Payer: Medicare Other | Admitting: Family Medicine

## 2023-02-09 ENCOUNTER — Encounter: Payer: Self-pay | Admitting: Family Medicine

## 2023-02-09 VITALS — BP 110/64 | HR 72 | Temp 97.0°F | Resp 20 | Ht 61.0 in | Wt 269.0 lb

## 2023-02-09 DIAGNOSIS — R21 Rash and other nonspecific skin eruption: Secondary | ICD-10-CM | POA: Diagnosis not present

## 2023-02-09 DIAGNOSIS — N3001 Acute cystitis with hematuria: Secondary | ICD-10-CM

## 2023-02-09 DIAGNOSIS — R300 Dysuria: Secondary | ICD-10-CM

## 2023-02-09 LAB — POC URINALSYSI DIPSTICK (AUTOMATED)
Bilirubin, UA: NEGATIVE
Blood, UA: 50
Glucose, UA: NEGATIVE
Nitrite, UA: NEGATIVE
Protein, UA: POSITIVE — AB
Spec Grav, UA: 1.015 (ref 1.010–1.025)
Urobilinogen, UA: NEGATIVE E.U./dL — AB
pH, UA: 6 (ref 5.0–8.0)

## 2023-02-09 MED ORDER — CEPHALEXIN 500 MG PO CAPS
500.0000 mg | ORAL_CAPSULE | Freq: Two times a day (BID) | ORAL | 0 refills | Status: AC
Start: 2023-02-09 — End: 2023-02-16

## 2023-02-09 NOTE — Progress Notes (Signed)
Assessment & Plan:  1. Acute cystitis with hematuria Education provided on UTIs. Encouraged adequate hydration.  - cephALEXin (KEFLEX) 500 MG capsule; Take 1 capsule (500 mg total) by mouth 2 (two) times daily for 7 days.  Dispense: 14 capsule; Refill: 0  2. Dysuria - POCT Urinalysis Dipstick (Automated) - Urine Culture; Future  Results for orders placed or performed in visit on 02/09/23  POCT Urinalysis Dipstick (Automated)  Result Value Ref Range   Color, UA yellow    Clarity, UA cloudy    Glucose, UA Negative Negative   Bilirubin, UA neg    Ketones, UA 5+    Spec Grav, UA 1.015 1.010 - 1.025   Blood, UA 50    pH, UA 6.0 5.0 - 8.0   Protein, UA Positive (A) Negative   Urobilinogen, UA negative (A) 0.2 or 1.0 E.U./dL   Nitrite, UA neg    Leukocytes, UA Moderate (2+) (A) Negative    3. Rash of face - Ambulatory referral to Dermatology   Follow up plan: Return if symptoms worsen or fail to improve.  [name], MSN, APRN, FNP-C  Subjective:  HPI: [name] is a 82 y.o. female presenting on 02/09/2023 for Urinary Tract Infection (Burning and pressure - started about 2 days ago ) and Rash (On face - noticed about 5 days ago getting worse )  Urinary Tract Infection: Patient complains of dysuria and suprapubic pressure. She has had symptoms for 2 days. Patient denies back pain, fever, and stomach ache. Patient does have a history of recurrent UTI.  Patient does not have a history of pyelonephritis.   Rash: Patient complains of rash involving the face. Rash started 5 days ago. Appearance of rash at onset: Color of lesion(s): skin colored, Texture of lesion(s): raised. Rash has worsened. Discomfort associated with rash: causes no discomfort. Denies itching or drainage. Associated symptoms: none. Denies: fever, nausea, and vomiting. Patient has not had previous evaluation of rash. Patient has not had previous treatment. Patient has not had contacts with similar rash.  Patient has not identified precipitant. Patient has not had new exposures (soaps, lotions, laundry detergents, foods, medications, plants, insects or animals.)    ROS: Negative unless specifically indicated above in HPI.   Relevant past medical history reviewed and updated as indicated.   Allergies and medications reviewed and updated.   Current Outpatient Medications:    Accu-Chek Softclix Lancets lancets, 1 each by Other route 3 (three) times daily. as directed, Disp: 100 each, Rfl: 3   allopurinol (ZYLOPRIM) 300 MG tablet, TAKE 1 TABLET BY MOUTH DAILY, Disp: 100 tablet, Rfl: 2   apixaban (ELIQUIS) 5 MG TABS tablet, TAKE 1 TABLET BY MOUTH TWICE  DAILY, Disp: 200 tablet, Rfl: 1   azelastine (ASTELIN) 0.1 % nasal spray, 1-2 puffs each nostril at bedtime as needed, Disp: 30 mL, Rfl: 12   b complex vitamins capsule, Take 1 capsule by mouth daily., Disp: , Rfl:    BD PEN NEEDLE NANO 2ND GEN 32G X 4 MM MISC, USE TWICE DAILY AS NEEDED, Disp: 100 each, Rfl: 11   blood glucose meter kit and supplies, Dispense based on patient and insurance preference. Use up to four times daily as directed. (FOR ICD-10 E10.9, E11.9)., Disp: 1 each, Rfl: 0   cholecalciferol (VITAMIN D3) 25 MCG (1000 UNIT) tablet, Take 1,000 Units by mouth daily., Disp: , Rfl:    clobetasol cream (TEMOVATE) 0.05 %, Apply 1 Application topically 2 (two) times daily., Disp: 30 g, Rfl:  1   cloNIDine (CATAPRES) 0.3 MG tablet, TAKE 1 TABLET BY MOUTH TWICE  DAILY, Disp: 200 tablet, Rfl: 2   Continuous Blood Gluc Receiver (FREESTYLE LIBRE 2 READER) DEVI, Use to check blood glucose. Use as directed, Disp: 1 each, Rfl: 3   Continuous Blood Gluc Sensor (FREESTYLE LIBRE 2 SENSOR) MISC, USE AS DIRECTED TO CHECK BLOOD  SUGAR DAILY, Disp: 8 each, Rfl: 2   dronedarone (MULTAQ) 400 MG tablet, Take 1 tablet (400 mg total) by mouth 2 (two) times daily., Disp: 180 tablet, Rfl: 2   Glucagon (GVOKE HYPOPEN 2-PACK) 0.5 MG/0.1ML SOAJ, Inject 0.5 mg into  the skin daily as needed. For Hypoglycemic events, Disp: 0.2 mL, Rfl: 5   guaiFENesin-codeine (ROBITUSSIN AC) 100-10 MG/5ML syrup, Take 5 mLs by mouth 3 (three) times daily as needed for cough., Disp: 180 mL, Rfl: 1   HYDROcodone-acetaminophen (NORCO) 5-325 MG tablet, Take 1 tablet by mouth every 6 (six) hours as needed for moderate pain., Disp: 30 tablet, Rfl: 0   hydrOXYzine (ATARAX) 10 MG tablet, Take 1 tablet (10 mg total) by mouth every 6 (six) hours as needed., Disp: 30 tablet, Rfl: 1   hydrOXYzine (VISTARIL) 25 MG capsule, Take 1 capsule (25 mg total) by mouth every 8 (eight) hours as needed., Disp: 30 capsule, Rfl: 3   Insulin Lispro Prot & Lispro (HUMALOG MIX 75/25 KWIKPEN) (75-25) 100 UNIT/ML Kwikpen, Inject 26 Units into the skin 2 (two) times daily before a meal., Disp: 60 mL, Rfl: 2   ipratropium (ATROVENT HFA) 17 MCG/ACT inhaler, Inhale 2 puffs into the lungs every 6 (six) hours as needed for wheezing., Disp: 1 each, Rfl: 2   ketorolac (ACULAR) 0.5 % ophthalmic solution, SMARTSIG:In Eye(s), Disp: , Rfl:    levothyroxine (SYNTHROID) 112 MCG tablet, Take 1 tablet (112 mcg total) by mouth daily., Disp: 90 tablet, Rfl: 3   lidocaine (LIDODERM) 5 %, Place 1 patch onto the skin daily. Remove & Discard patch within 12 hours or as directed by MD, Disp: 30 patch, Rfl: 0   losartan (COZAAR) 100 MG tablet, TAKE 1 TABLET BY MOUTH DAILY, Disp: 70 tablet, Rfl: 4   metoprolol tartrate (LOPRESSOR) 25 MG tablet, Take 1 tablet (25 mg total) by mouth 2 (two) times daily., Disp: 180 tablet, Rfl: 3   nystatin powder, Apply 1 Application topically 2 (two) times daily., Disp: 60 g, Rfl: 7   ONETOUCH VERIO test strip, 1 each by Other route 3 (three) times daily., Disp: , Rfl:    predniSONE (DELTASONE) 10 MG tablet, 3 tabs by mouth per day for 3 days,2tabs per day for 3 days,1tab per day for 3 days, Disp: 18 tablet, Rfl: 0   RESTASIS MULTIDOSE 0.05 % ophthalmic emulsion, Place 1 drop into both eyes 2 (two) times  daily., Disp: , Rfl:    rosuvastatin (CRESTOR) 10 MG tablet, TAKE 1 TABLET BY MOUTH IN  THE EVENING, Disp: 90 tablet, Rfl: 3   tirzepatide (MOUNJARO) 5 MG/0.5ML Pen, Inject 5 mg into the skin once a week., Disp: 6 mL, Rfl: 3   TOBRADEX ophthalmic ointment, , Disp: , Rfl:    torsemide (DEMADEX) 20 MG tablet, Take 1 tablet (20 mg total) by mouth daily., Disp: 90 tablet, Rfl: 2   triamcinolone cream (KENALOG) 0.1 %, Apply 1 application topically 2 (two) times daily., Disp: 30 g, Rfl: 0   vitamin B-12 (CYANOCOBALAMIN) 1000 MCG tablet, Take 1,000 mcg by mouth daily., Disp: , Rfl:   Allergies  Allergen Reactions  Doxycycline     Made patient feel very ill    Metformin Diarrhea    Objective:   BP 110/64   Pulse 72   Temp (!) 97 F (36.1 C)   Resp 20   Ht  (1.549 m)   Wt 269 lb (122 kg)   BMI 50.83 kg/m    Physical Exam Vitals reviewed.  Constitutional:      General: She is not in acute distress.    Appearance: Normal appearance. She is not ill-appearing, toxic-appearing or diaphoretic.  HENT:     Head: Normocephalic and atraumatic.  Eyes:     General: No scleral icterus.       Right eye: No discharge.        Left eye: No discharge.     Conjunctiva/sclera: Conjunctivae normal.  Cardiovascular:     Rate and Rhythm: Normal rate.  Pulmonary:     Effort: Pulmonary effort is normal. No respiratory distress.  Musculoskeletal:        General: Normal range of motion.     Cervical back: Normal range of motion.  Skin:    General: Skin is warm and dry.     Capillary Refill: Capillary refill takes less than 2 seconds.     Findings: Rash (raised, clustered to both cheeks, skin colored. No drainage, warmth, or erythema.) present.  Neurological:     General: No focal deficit present.     Mental Status: She is alert and oriented to person, place, and time. Mental status is at baseline.  Psychiatric:        Mood and Affect: Mood normal.        Behavior: Behavior normal.         Thought Content: Thought content normal.        Judgment: Judgment normal.

## 2023-02-10 ENCOUNTER — Other Ambulatory Visit: Payer: Self-pay | Admitting: Emergency Medicine

## 2023-02-10 LAB — URINE CULTURE

## 2023-02-11 ENCOUNTER — Other Ambulatory Visit: Payer: Self-pay | Admitting: Internal Medicine

## 2023-02-11 ENCOUNTER — Other Ambulatory Visit: Payer: Self-pay | Admitting: Emergency Medicine

## 2023-02-11 DIAGNOSIS — E1165 Type 2 diabetes mellitus with hyperglycemia: Secondary | ICD-10-CM

## 2023-02-17 ENCOUNTER — Other Ambulatory Visit: Payer: Self-pay | Admitting: Nephrology

## 2023-02-17 DIAGNOSIS — I129 Hypertensive chronic kidney disease with stage 1 through stage 4 chronic kidney disease, or unspecified chronic kidney disease: Secondary | ICD-10-CM

## 2023-02-17 DIAGNOSIS — N179 Acute kidney failure, unspecified: Secondary | ICD-10-CM | POA: Diagnosis not present

## 2023-02-17 DIAGNOSIS — D631 Anemia in chronic kidney disease: Secondary | ICD-10-CM

## 2023-02-17 DIAGNOSIS — N2581 Secondary hyperparathyroidism of renal origin: Secondary | ICD-10-CM

## 2023-02-17 DIAGNOSIS — N189 Chronic kidney disease, unspecified: Secondary | ICD-10-CM | POA: Diagnosis not present

## 2023-02-17 DIAGNOSIS — N1832 Chronic kidney disease, stage 3b: Secondary | ICD-10-CM | POA: Diagnosis not present

## 2023-02-19 ENCOUNTER — Ambulatory Visit (INDEPENDENT_AMBULATORY_CARE_PROVIDER_SITE_OTHER): Payer: Medicare Other

## 2023-02-19 ENCOUNTER — Ambulatory Visit (INDEPENDENT_AMBULATORY_CARE_PROVIDER_SITE_OTHER): Payer: Medicare Other | Admitting: Emergency Medicine

## 2023-02-19 ENCOUNTER — Encounter: Payer: Self-pay | Admitting: Emergency Medicine

## 2023-02-19 VITALS — BP 128/74 | HR 65 | Temp 98.0°F | Ht 61.0 in | Wt 269.2 lb

## 2023-02-19 DIAGNOSIS — M545 Low back pain, unspecified: Secondary | ICD-10-CM | POA: Diagnosis not present

## 2023-02-19 DIAGNOSIS — S39012A Strain of muscle, fascia and tendon of lower back, initial encounter: Secondary | ICD-10-CM

## 2023-02-19 DIAGNOSIS — M8588 Other specified disorders of bone density and structure, other site: Secondary | ICD-10-CM | POA: Diagnosis not present

## 2023-02-19 MED ORDER — HYDROCODONE-ACETAMINOPHEN 5-325 MG PO TABS
1.0000 | ORAL_TABLET | Freq: Four times a day (QID) | ORAL | 0 refills | Status: DC | PRN
Start: 2023-02-19 — End: 2023-03-09

## 2023-02-19 NOTE — Progress Notes (Signed)
[name] 82 y.o.   Chief Complaint  Patient presents with   Back Pain    Lower back pain     HISTORY OF PRESENT ILLNESS: This is a 82 y.o. female complaining of low back pain that started 6 days ago.  Denies injury. Denies associated symptoms.  Localized lumbar sharp pain without radiation to the legs Seen by nephrologist 2 days ago for chronic kidney disease.  Stable.  No concerns.  Scheduled for kidney ultrasound.  Back Pain Pertinent negatives include no abdominal pain, chest pain, fever or headaches.     Prior to Admission medications   Medication Sig Start Date End Date Taking? Authorizing Provider  Accu-Chek Softclix Lancets lancets 1 each by Other route 3 (three) times daily. as directed 03/30/20  Yes Janeece Agee, NP  allopurinol (ZYLOPRIM) 300 MG tablet TAKE 1 TABLET BY MOUTH DAILY 11/13/22  Yes Vernette Moise, Eilleen Kempf, MD  apixaban (ELIQUIS) 5 MG TABS tablet TAKE 1 TABLET BY MOUTH TWICE  DAILY 11/10/22  Yes Duke Salvia, MD  azelastine (ASTELIN) 0.1 % nasal spray 1-2 puffs each nostril at bedtime as needed 01/02/22  Yes Young, Joni Fears D, MD  b complex vitamins capsule Take 1 capsule by mouth daily.   Yes [provider]  BD PEN NEEDLE NANO 2ND GEN 32G X 4 MM MISC USE TWICE DAILY AS NEEDED 08/02/22  Yes Ruhaan Nordahl, Eilleen Kempf, MD  blood glucose meter kit and supplies Dispense based on patient and insurance preference. Use up to four times daily as directed. (FOR ICD-10 E10.9, E11.9). 08/29/21  Yes Princeton Nabor, Eilleen Kempf, MD  cholecalciferol (VITAMIN D3) 25 MCG (1000 UNIT) tablet Take 1,000 Units by mouth daily.   Yes [provider]  clobetasol cream (TEMOVATE) 0.05 % Apply 1 Application topically 2 (two) times daily. 06/05/22  Yes Josel Keo, Eilleen Kempf, MD  cloNIDine (CATAPRES) 0.3 MG tablet TAKE 1 TABLET BY MOUTH TWICE  DAILY 11/06/22  Yes Shawntay Prest, Eilleen Kempf, MD  Continuous Blood Gluc Receiver (FREESTYLE LIBRE 2 READER) DEVI Use to check blood  glucose. Use as directed 01/08/23  Yes Chayse Gracey, Eilleen Kempf, MD  Continuous Glucose Sensor (FREESTYLE LIBRE 2 SENSOR) MISC USE AS DIRECTED TO CHECK BLOOD  SUGAR DAILY 02/11/23  Yes Georgina Quint, MD  dronedarone (MULTAQ) 400 MG tablet Take 1 tablet (400 mg total) by mouth 2 (two) times daily. 01/19/23  Yes Duke Salvia, MD  Glucagon (GVOKE HYPOPEN 2-PACK) 0.5 MG/0.1ML SOAJ Inject 0.5 mg into the skin daily as needed. For Hypoglycemic events 05/30/22  Yes Torunn Chancellor, Eilleen Kempf, MD  guaiFENesin-codeine [facility]) 100-10 MG/5ML syrup Take 5 mLs by mouth 3 (three) times daily as needed for cough. 01/15/23  Yes Corwin Levins, MD  HYDROcodone-acetaminophen (NORCO) 5-325 MG tablet Take 1 tablet by mouth every 6 (six) hours as needed for moderate pain. 08/12/22  Yes Dvaughn Fickle, Eilleen Kempf, MD  hydrOXYzine (ATARAX) 10 MG tablet Take 1 tablet (10 mg total) by mouth every 6 (six) hours as needed. 08/12/22  Yes Jarrett Chicoine, Eilleen Kempf, MD  hydrOXYzine (VISTARIL) 25 MG capsule Take 1 capsule (25 mg total) by mouth every 8 (eight) hours as needed. 01/15/23  Yes Corwin Levins, MD  Insulin Lispro Prot & Lispro (HUMALOG MIX 75/25 KWIKPEN) (75-25) 100 UNIT/ML Kwikpen Inject 26 Units into the skin 2 (two) times daily before a meal. 12/12/22  Yes [name], Konrad Dolores, MD  ketorolac (ACULAR) 0.5 % ophthalmic solution SMARTSIG:In Eye(s) 08/22/21  Yes [provider]  levothyroxine (SYNTHROID)  112 MCG tablet Take 1 tablet (112 mcg total) by mouth daily. 12/31/22  Yes Zaiah Credeur, Eilleen Kempf, MD  lidocaine (LIDODERM) 5 % Place 1 patch onto the skin daily. Remove & Discard patch within 12 hours or as directed by MD 08/29/20  Yes Kathryne Hitch, MD  losartan (COZAAR) 100 MG tablet TAKE 1 TABLET BY MOUTH DAILY 10/13/22  Yes Nahom Carfagno, Eilleen Kempf, MD  metoprolol tartrate (LOPRESSOR) 25 MG tablet Take 1 tablet (25 mg total) by mouth 2 (two) times daily. 10/29/22 10/24/23 Yes Alayza Pieper, Eilleen Kempf, MD   nystatin powder Apply 1 Application topically 2 (two) times daily. 08/12/22  Yes Connie Lasater, Eilleen Kempf, MD  [facility] VERIO test strip 1 each by Other route 3 (three) times daily. 06/08/20  Yes [provider]  predniSONE (DELTASONE) 10 MG tablet 3 tabs by mouth per day for 3 days,2tabs per day for 3 days,1tab per day for 3 days 01/15/23  Yes Corwin Levins, MD  RESTASIS MULTIDOSE 0.05 % ophthalmic emulsion Place 1 drop into both eyes 2 (two) times daily. 03/04/20  Yes [provider]  rosuvastatin (CRESTOR) 10 MG tablet TAKE 1 TABLET BY MOUTH IN THE  EVENING 02/11/23  Yes Ahniyah Giancola, Eilleen Kempf, MD  spironolactone (ALDACTONE) 25 MG tablet TAKE 1 TABLET BY MOUTH DAILY 02/11/23  Yes [name], Konrad Dolores, MD  tirzepatide [facility]) 5 MG/0.5ML Pen Inject 5 mg into the skin once a week. 12/12/22  Yes [name], Konrad Dolores, MD  TOBRADEX ophthalmic ointment  08/25/21  Yes [provider]  torsemide (DEMADEX) 20 MG tablet Take 1 tablet (20 mg total) by mouth daily. 01/19/23  Yes Duke Salvia, MD  triamcinolone cream (KENALOG) 0.1 % Apply 1 application topically 2 (two) times daily. 07/26/20  Yes Gillis Boardley, Eilleen Kempf, MD  vitamin B-12 (CYANOCOBALAMIN) 1000 MCG tablet Take 1,000 mcg by mouth daily.   Yes [provider]  ipratropium (ATROVENT HFA) 17 MCG/ACT inhaler Inhale 2 puffs into the lungs every 6 (six) hours as needed for wheezing. 12/17/21 02/09/23  Waymon Budge, MD  potassium chloride (KLOR-CON) 10 MEQ tablet Take 4 tablets (40 mEq total) by mouth daily. 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   Coronary artery disease involving native coronary artery of native heart without angina pectoris 02/12/2022   Adrenal adenoma, right 02/07/2022   Skin lesion of right arm 02/02/2022   Asthma 11/01/2021   Normocytic anemia 10/11/2021   Nodule of right lung 09/30/2021   Persistent cough 09/27/2021   Dysthymia 03/12/2021   Primary osteoarthritis involving multiple joints 03/01/2021   Recurrent falls 01/10/2021   Sensorineural hearing loss (SNHL), 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   Persistent atrial fibrillation (HCC) 08/12/2020   Type 2 diabetes mellitus with hyperglycemia, with long-term current use of insulin (HCC) 06/21/2020   Dyslipidemia associated with type 2 diabetes mellitus (HCC) 06/21/2020   Mixed hyperlipidemia 06/21/2020   Uncontrolled hypertension 06/11/2020   Aortic atherosclerosis (HCC) 03/28/2020   Diabetic peripheral neuropathy (HCC) 12/26/2019   Gastroesophageal reflux disease without esophagitis 03/17/2019   Rash and nonspecific skin eruption 03/17/2019  Pruritus 02/16/2019   Varicose veins of both legs with edema 02/16/2019   Heart murmur 09/05/2018   Restrictive lung disease 02/22/2018   Dysgeusia 06/11/2017   Osteoarthritis of left knee 06/03/2017   NAFLD (nonalcoholic fatty liver disease) 16/07/9603   Osteoporosis 12/31/2015   Obesity 12/19/2015   Acquired hypothyroidism 10/18/2015   Prolonged QT interval 10/03/2013   CHF (congestive heart failure) (HCC) 04/21/2013   Long term (current) use of anticoagulants 06/09/2012   Musculoskeletal back pain    BENIGN NEOPLASM OF ADRENAL GLAND 11/25/2010   Chronic diastolic heart failure (HCC) 02/20/2010   Diabetes (HCC) 05/16/2009   GOUT 05/16/2009   OBESITY, MORBID 05/16/2009   Hypertension associated with diabetes (HCC) 05/16/2009   Paroxysmal atrial fibrillation (HCC) 05/16/2009   HYPERLIPIDEMIA 11/30/2008   Obstructive sleep apnea on CPAP 11/30/2008    Past Medical History:   Diagnosis Date   Arthritis    Back pain    Chronic anticoagulation    due to aflutter   Chronic kidney disease    Diabetes mellitus    Diastolic CHF, chronic (HCC)    a.  echo 2006 - ef 55-65%; mild diast dysfxn;    b. Echo 08/2011: Mild LVH, EF 60%;  c. 04/2013 Echo: EF 65-69%, mild conc LVH;  08/2014 Echo: EF 60-65%, mild-mod MR.   Gout    Hyperlipidemia    Hypertension    a.  Renal arterial Dopplers 12/2011: 1-59% right renal artery stenosis   Morbid obesity (HCC)    Obstructive sleep apnea on CPAP    Paroxysmal Afib/Flutter    a. 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    Past Surgical History:  Procedure Laterality Date   APPENDECTOMY     ATRIAL FLUTTER ABLATION N/A 09/24/2011   Procedure: ATRIAL FLUTTER ABLATION;  Surgeon: Marinus Maw, MD;  Location: [facility] CATH LAB;  Service: Cardiovascular;  Laterality: N/A;   CARDIOVERSION  10/22/2011   Procedure: CARDIOVERSION;  Surgeon: Duke Salvia, MD;  Location: [facility] OR;  Service: Cardiovascular;  Laterality: N/A;   CARDIOVERSION N/A 09/10/2011   Procedure: CARDIOVERSION;  Surgeon: Duke Salvia, MD;  Location: [facility] CATH LAB;  Service: Cardiovascular;  Laterality: N/A;   CHOLECYSTECTOMY     COLONOSCOPY WITH PROPOFOL N/A 06/13/2021   Procedure: COLONOSCOPY WITH PROPOFOL;  Surgeon: Rachael Fee, MD;  Location: [facility] ENDOSCOPY;  Service: Endoscopy;  Laterality: N/A;   POLYPECTOMY  06/13/2021   Procedure: POLYPECTOMY;  Surgeon: Rachael Fee, MD;  Location: [facility] ENDOSCOPY;  Service: Endoscopy;;   TONSILLECTOMY  1982   TOTAL ABDOMINAL HYSTERECTOMY      Social History   Socioeconomic History   Marital status: Married    Spouse name: Not on file   Number of children: 3   Years of education: Not on file   Highest education level: Not on file  Occupational History   Occupation: DISABILITY/housewife    Employer: RETIRED  Tobacco Use   Smoking status: Never   Smokeless tobacco: Never  Vaping Use   Vaping Use: Never used  Substance  and Sexual Activity   Alcohol use: No   Drug use: No   Sexual activity: Yes  Other Topics Concern   Not on file  Social History Narrative   Not on file   Social Determinants of Health   Financial Resource Strain: Low Risk  (11/21/2022)   Overall Financial Resource Strain (CARDIA)    Difficulty of Paying Living Expenses: Not hard at all  Food Insecurity: No Food Insecurity (11/21/2022)  Hunger Vital Sign    Worried About Running Out of Food in the Last Year: Never true    Ran Out of Food in the Last Year: Never true  Transportation Needs: No Transportation Needs (11/21/2022)   PRAPARE - Administrator, Civil Service (Medical): No    Lack of Transportation (Non-Medical): No  Physical Activity: Inactive (11/21/2022)   Exercise Vital Sign    Days of Exercise per Week: 0 days    Minutes of Exercise per Session: 0 min  Stress: No Stress Concern Present (11/21/2022)   [employer] of Occupational Health - Occupational Stress Questionnaire    Feeling of Stress : Not at all  Social Connections: Socially Integrated (11/21/2022)   Social Connection and Isolation Panel [NHANES]    Frequency of Communication with Friends and Family: More than three times a week    Frequency of Social Gatherings with Friends and Family: Never    Attends Religious Services: More than 4 times per year    Active Member of [employer] or Organizations: Yes    Attends Engineer, structural: More than 4 times per year    Marital Status: Married  Catering manager Violence: Not At Risk (11/21/2022)   Humiliation, Afraid, Rape, and Kick questionnaire    Fear of Current or Ex-Partner: No    Emotionally Abused: No    Physically Abused: No    Sexually Abused: No    Family History  Problem Relation Age of Onset   Heart disease Father    Hypertension Father    Breast cancer Sister    Cancer Sister        breast   Colon cancer Neg Hx    Esophageal cancer Neg Hx    Pancreatic cancer Neg Hx    Liver  disease Neg Hx      Review of Systems  Constitutional: Negative.  Negative for chills and fever.  HENT: Negative.  Negative for congestion and sore throat.   Respiratory: Negative.  Negative for cough and shortness of breath.   Cardiovascular: Negative.  Negative for chest pain and palpitations.  Gastrointestinal:  Positive for constipation. Negative for abdominal pain, nausea and vomiting.  Musculoskeletal:  Positive for back pain.  Skin: Negative.  Negative for rash.  Neurological: Negative.  Negative for dizziness and headaches.  All other systems reviewed and are negative.   Today's Vitals   02/19/23 1305  BP: 128/74  Pulse: 65  Temp: 98 F (36.7 C)  TempSrc: Oral  SpO2: 99%  Weight: 269 lb 4 oz (122.1 kg)  Height: 5\' 1"  (1.549 m)   Body mass index is 50.87 kg/m.   Physical Exam Vitals reviewed.  Constitutional:      Appearance: Normal appearance. She is obese.  HENT:     Head: Normocephalic.  Eyes:     Extraocular Movements: Extraocular movements intact.  Cardiovascular:     Rate and Rhythm: Normal rate.  Pulmonary:     Effort: Pulmonary effort is normal.  Abdominal:     Palpations: Abdomen is soft.     Tenderness: There is no abdominal tenderness.  Musculoskeletal:     Lumbar back: Spasms and tenderness present. No bony tenderness. Decreased range of motion.  Skin:    General: Skin is warm and dry.  Neurological:     Mental Status: She is alert and oriented to person, place, and time.  Psychiatric:        Mood and Affect: Mood normal.  Behavior: Behavior normal.    DG Lumbar Spine 2-3 Views  Result Date: 02/19/2023 CLINICAL DATA:  Low back pain four proximally a week EXAM: LUMBAR SPINE - 3 VIEW COMPARISON:  None Available. FINDINGS: Osteopenia. Five lumbar-type vertebral bodies. Preserved vertebral body height. Significant disc height loss at L5-S1. Scattered endplate osteophytes. Prominent lower lumbar facet degenerative changes. No listhesis.  Mild degenerative changes of the sacroiliac joints. Surgical clips in the right upper quadrant. Moderate colonic stool. Degenerative changes seen of the hip joints as well. IMPRESSION: Osteopenia with scattered degenerative changes. Electronically Signed   By: Karen Kays M.D.   On: 02/19/2023 13:58     ASSESSMENT & PLAN: Problem List Items Addressed This Visit       Musculoskeletal and Integument   Acute lumbar myofascial strain - Primary    Acute and affecting quality of life and ambulation. Pain management discussed.  Cannot take NSAIDs Recommend to take Tylenol for mild to moderate pain and Norco for moderate to severe pain X-ray done today.  Report reviewed.  Degenerative changes of lumbar spine noted. May need orthopedic evaluation.      Relevant Medications   HYDROcodone-acetaminophen (NORCO) 5-325 MG tablet   Other Relevant Orders   DG Lumbar Spine 2-3 Views (Completed)   Patient Instructions  Acute Back Pain, Adult Acute back pain is sudden and usually short-lived. It is often caused by an injury to the muscles and tissues in the back. The injury may result from: A muscle, tendon, or ligament getting overstretched or torn. Ligaments are tissues that connect bones to each other. Lifting something improperly can cause a back strain. Wear and tear (degeneration) of the spinal disks. Spinal disks are circular tissue that provide cushioning between the bones of the spine (vertebrae). Twisting motions, such as while playing sports or doing yard work. A hit to the back. Arthritis. You may have a physical exam, lab tests, and imaging tests to find the cause of your pain. Acute back pain usually goes away with rest and home care. Follow these instructions at home: Managing pain, stiffness, and swelling Take over-the-counter and prescription medicines only as told by your health care provider. Treatment may include medicines for pain and inflammation that are taken by mouth or applied  to the skin, or muscle relaxants. Your health care provider may recommend applying ice during the first 24-48 hours after your pain starts. To do this: Put ice in a plastic bag. Place a towel between your skin and the bag. Leave the ice on for 20 minutes, 2-3 times a day. Remove the ice if your skin turns bright red. This is very important. If you cannot feel pain, heat, or cold, you have a greater risk of damage to the area. If directed, apply heat to the affected area as often as told by your health care provider. Use the heat source that your health care provider recommends, such as a moist heat pack or a heating pad. Place a towel between your skin and the heat source. Leave the heat on for 20-30 minutes. Remove the heat if your skin turns bright red. This is especially important if you are unable to feel pain, heat, or cold. You have a greater risk of getting burned. Activity  Do not stay in bed. Staying in bed for more than 1-2 days can delay your recovery. Sit up and stand up straight. Avoid leaning forward when you sit or hunching over when you stand. If you work at a desk, sit  close to it so you do not need to lean over. Keep your chin tucked in. Keep your neck drawn back, and keep your elbows bent at a 90-degree angle (right angle). Sit high and close to the steering wheel when you drive. Add lower back (lumbar) support to your car seat, if needed. Take short walks on even surfaces as soon as you are able. Try to increase the length of time you walk each day. Do not sit, drive, or stand in one place for more than 30 minutes at a time. Sitting or standing for long periods of time can put stress on your back. Do not drive or use heavy machinery while taking prescription pain medicine. Use proper lifting techniques. When you bend and lift, use positions that put less stress on your back: [place] your knees. Keep the load close to your body. Avoid twisting. Exercise regularly as told by  your health care provider. Exercising helps your back heal faster and helps prevent back injuries by keeping muscles strong and flexible. Work with a physical therapist to make a safe exercise program, as recommended by your health care provider. Do any exercises as told by your physical therapist. Lifestyle Maintain a healthy weight. Extra weight puts stress on your back and makes it difficult to have good posture. Avoid activities or situations that make you feel anxious or stressed. Stress and anxiety increase muscle tension and can make back pain worse. Learn ways to manage anxiety and stress, such as through exercise. General instructions Sleep on a firm mattress in a comfortable position. Try lying on your side with your knees slightly bent. If you lie on your back, put a pillow under your knees. Keep your head and neck in a straight line with your spine (neutral position) when using electronic equipment like smartphones or pads. To do this: Raise your smartphone or pad to look at it instead of bending your head or neck to look down. Put the smartphone or pad at the level of your face while looking at the screen. Follow your treatment plan as told by your health care provider. This may include: Cognitive or behavioral therapy. Acupuncture or massage therapy. Meditation or yoga. Contact a health care provider if: You have pain that is not relieved with rest or medicine. You have increasing pain going down into your legs or buttocks. Your pain does not improve after 2 weeks. You have pain at night. You lose weight without trying. You have a fever or chills. You develop nausea or vomiting. You develop abdominal pain. Get help right away if: You develop new bowel or bladder control problems. You have unusual weakness or numbness in your arms or legs. You feel faint. These symptoms may represent a serious problem that is an emergency. Do not wait to see if the symptoms will go away. Get  medical help right away. Call your local emergency services (911 in the [place] Do not drive yourself to the hospital. Summary Acute back pain is sudden and usually short-lived. Use proper lifting techniques. When you bend and lift, use positions that put less stress on your back. Take over-the-counter and prescription medicines only as told by your health care provider, and apply heat or ice as told. This information is not intended to replace advice given to you by your health care provider. Make sure you discuss any questions you have with your health care provider. Document Revised: 12/28/2020 Document Reviewed: 12/28/2020 Elsevier Patient Education  2023 [employer].  Agustina Caroli, MD [facility]

## 2023-02-19 NOTE — Assessment & Plan Note (Addendum)
Acute and affecting quality of life and ambulation. Pain management discussed.  Cannot take NSAIDs Recommend to take Tylenol for mild to moderate pain and Norco for moderate to severe pain X-ray done today.  Report reviewed.  Degenerative changes of lumbar spine noted. May need orthopedic evaluation.

## 2023-02-19 NOTE — Patient Instructions (Signed)
Acute Back Pain, Adult Acute back pain is sudden and usually short-lived. It is often caused by an injury to the muscles and tissues in the back. The injury may result from: A muscle, tendon, or ligament getting overstretched or torn. Ligaments are tissues that connect bones to each other. Lifting something improperly can cause a back strain. Wear and tear (degeneration) of the spinal disks. Spinal disks are circular tissue that provide cushioning between the bones of the spine (vertebrae). Twisting motions, such as while playing sports or doing yard work. A hit to the back. Arthritis. You may have a physical exam, lab tests, and imaging tests to find the cause of your pain. Acute back pain usually goes away with rest and home care. Follow these instructions at home: Managing pain, stiffness, and swelling Take over-the-counter and prescription medicines only as told by your health care provider. Treatment may include medicines for pain and inflammation that are taken by mouth or applied to the skin, or muscle relaxants. Your health care provider may recommend applying ice during the first 24-48 hours after your pain starts. To do this: Put ice in a plastic bag. Place a towel between your skin and the bag. Leave the ice on for 20 minutes, 2-3 times a day. Remove the ice if your skin turns bright red. This is very important. If you cannot feel pain, heat, or cold, you have a greater risk of damage to the area. If directed, apply heat to the affected area as often as told by your health care provider. Use the heat source that your health care provider recommends, such as a moist heat pack or a heating pad. Place a towel between your skin and the heat source. Leave the heat on for 20-30 minutes. Remove the heat if your skin turns bright red. This is especially important if you are unable to feel pain, heat, or cold. You have a greater risk of getting burned. Activity  Do not stay in bed. Staying in  bed for more than 1-2 days can delay your recovery. Sit up and stand up straight. Avoid leaning forward when you sit or hunching over when you stand. If you work at a desk, sit close to it so you do not need to lean over. Keep your chin tucked in. Keep your neck drawn back, and keep your elbows bent at a 90-degree angle (right angle). Sit high and close to the steering wheel when you drive. Add lower back (lumbar) support to your car seat, if needed. Take short walks on even surfaces as soon as you are able. Try to increase the length of time you walk each day. Do not sit, drive, or stand in one place for more than 30 minutes at a time. Sitting or standing for long periods of time can put stress on your back. Do not drive or use heavy machinery while taking prescription pain medicine. Use proper lifting techniques. When you bend and lift, use positions that put less stress on your back: Bend your knees. Keep the load close to your body. Avoid twisting. Exercise regularly as told by your health care provider. Exercising helps your back heal faster and helps prevent back injuries by keeping muscles strong and flexible. Work with a physical therapist to make a safe exercise program, as recommended by your health care provider. Do any exercises as told by your physical therapist. Lifestyle Maintain a healthy weight. Extra weight puts stress on your back and makes it difficult to have good   posture. Avoid activities or situations that make you feel anxious or stressed. Stress and anxiety increase muscle tension and can make back pain worse. Learn ways to manage anxiety and stress, such as through exercise. General instructions Sleep on a firm mattress in a comfortable position. Try lying on your side with your knees slightly bent. If you lie on your back, put a pillow under your knees. Keep your head and neck in a straight line with your spine (neutral position) when using electronic equipment like  smartphones or pads. To do this: Raise your smartphone or pad to look at it instead of bending your head or neck to look down. Put the smartphone or pad at the level of your face while looking at the screen. Follow your treatment plan as told by your health care provider. This may include: Cognitive or behavioral therapy. Acupuncture or massage therapy. Meditation or yoga. Contact a health care provider if: You have pain that is not relieved with rest or medicine. You have increasing pain going down into your legs or buttocks. Your pain does not improve after 2 weeks. You have pain at night. You lose weight without trying. You have a fever or chills. You develop nausea or vomiting. You develop abdominal pain. Get help right away if: You develop new bowel or bladder control problems. You have unusual weakness or numbness in your arms or legs. You feel faint. These symptoms may represent a serious problem that is an emergency. Do not wait to see if the symptoms will go away. Get medical help right away. Call your local emergency services (911 in the [place] Do not drive yourself to the hospital. Summary Acute back pain is sudden and usually short-lived. Use proper lifting techniques. When you bend and lift, use positions that put less stress on your back. Take over-the-counter and prescription medicines only as told by your health care provider, and apply heat or ice as told. This information is not intended to replace advice given to you by your health care provider. Make sure you discuss any questions you have with your health care provider. Document Revised: 12/28/2020 Document Reviewed: 12/28/2020 Elsevier Patient Education  2023 Elsevier Inc.  

## 2023-02-20 DIAGNOSIS — N179 Acute kidney failure, unspecified: Secondary | ICD-10-CM | POA: Diagnosis not present

## 2023-02-21 NOTE — Progress Notes (Signed)
Pain medication was prescribed.  Problem may get worse over time.  Thanks.

## 2023-02-22 LAB — LAB REPORT - SCANNED
Creatinine, POC: 42 mg/dL
EGFR: 35

## 2023-02-25 ENCOUNTER — Ambulatory Visit
Admission: RE | Admit: 2023-02-25 | Discharge: 2023-02-25 | Disposition: A | Discharge status: Home / Self Care | Payer: Medicare Other | Source: Ambulatory Visit | Attending: Nephrology | Admitting: Nephrology

## 2023-02-25 DIAGNOSIS — E279 Disorder of adrenal gland, unspecified: Secondary | ICD-10-CM | POA: Diagnosis not present

## 2023-02-25 DIAGNOSIS — N189 Chronic kidney disease, unspecified: Secondary | ICD-10-CM | POA: Diagnosis not present

## 2023-02-25 DIAGNOSIS — N2581 Secondary hyperparathyroidism of renal origin: Secondary | ICD-10-CM

## 2023-02-25 DIAGNOSIS — I129 Hypertensive chronic kidney disease with stage 1 through stage 4 chronic kidney disease, or unspecified chronic kidney disease: Secondary | ICD-10-CM

## 2023-03-02 DIAGNOSIS — E875 Hyperkalemia: Secondary | ICD-10-CM | POA: Diagnosis not present

## 2023-03-09 ENCOUNTER — Encounter: Payer: Self-pay | Admitting: Emergency Medicine

## 2023-03-09 ENCOUNTER — Telehealth: Payer: Self-pay | Admitting: *Deleted

## 2023-03-09 ENCOUNTER — Ambulatory Visit (INDEPENDENT_AMBULATORY_CARE_PROVIDER_SITE_OTHER): Payer: Medicare Other | Admitting: Emergency Medicine

## 2023-03-09 ENCOUNTER — Ambulatory Visit (INDEPENDENT_AMBULATORY_CARE_PROVIDER_SITE_OTHER): Payer: Medicare Other

## 2023-03-09 VITALS — BP 126/80 | HR 62 | Temp 97.8°F | Ht 61.0 in

## 2023-03-09 DIAGNOSIS — S8001XA Contusion of right knee, initial encounter: Secondary | ICD-10-CM | POA: Diagnosis not present

## 2023-03-09 DIAGNOSIS — L089 Local infection of the skin and subcutaneous tissue, unspecified: Secondary | ICD-10-CM | POA: Diagnosis not present

## 2023-03-09 DIAGNOSIS — S80211A Abrasion, right knee, initial encounter: Secondary | ICD-10-CM | POA: Insufficient documentation

## 2023-03-09 DIAGNOSIS — S8991XA Unspecified injury of right lower leg, initial encounter: Secondary | ICD-10-CM | POA: Diagnosis not present

## 2023-03-09 DIAGNOSIS — T148XXA Other injury of unspecified body region, initial encounter: Secondary | ICD-10-CM | POA: Diagnosis not present

## 2023-03-09 MED ORDER — OXYCODONE-ACETAMINOPHEN 5-325 MG PO TABS: 1.0000 | ORAL_TABLET | Freq: Four times a day (QID) | ORAL | 0 refills | Status: AC | PRN

## 2023-03-09 MED ORDER — CEFADROXIL 500 MG PO CAPS
500.0000 mg | ORAL_CAPSULE | Freq: Two times a day (BID) | ORAL | 0 refills | Status: AC
Start: 2023-03-09 — End: 2023-03-16

## 2023-03-09 NOTE — Assessment & Plan Note (Signed)
Unremarkable x-ray.  No fracture. Pain management discussed Recommend Tylenol for mild to moderate pain and Percocet for moderate to severe pain Advised to keep leg elevated when possible

## 2023-03-09 NOTE — Progress Notes (Signed)
[name] 82 y.o.   Chief Complaint  Patient presents with   Fall    Patient fell last Thurs, right knee pain,     HISTORY OF PRESENT ILLNESS: Acute problem visit today. This is a 82 y.o. female tripped and fell last Thursday.  Injured and scraped right knee.  Now complaining of severe pain and possible infection. No other complaints or medical concerns today.  Fall Pertinent negatives include no fever, headaches, nausea or vomiting.     Prior to Admission medications   Medication Sig Start Date End Date Taking? Authorizing Provider  Accu-Chek Softclix Lancets lancets 1 each by Other route 3 (three) times daily. as directed 03/30/20  Yes Janeece Agee, NP  allopurinol (ZYLOPRIM) 300 MG tablet TAKE 1 TABLET BY MOUTH DAILY 11/13/22  Yes Shantrell Placzek, Eilleen Kempf, MD  apixaban (ELIQUIS) 5 MG TABS tablet TAKE 1 TABLET BY MOUTH TWICE  DAILY 11/10/22  Yes Duke Salvia, MD  azelastine (ASTELIN) 0.1 % nasal spray 1-2 puffs each nostril at bedtime as needed 01/02/22  Yes Young, Joni Fears D, MD  b complex vitamins capsule Take 1 capsule by mouth daily.   Yes [provider]  BD PEN NEEDLE NANO 2ND GEN 32G X 4 MM MISC USE TWICE DAILY AS NEEDED 08/02/22  Yes Kelli Robeck, Eilleen Kempf, MD  blood glucose meter kit and supplies Dispense based on patient and insurance preference. Use up to four times daily as directed. (FOR ICD-10 E10.9, E11.9). 08/29/21  Yes Celestia Duva, Eilleen Kempf, MD  cefadroxil (DURICEF) 500 MG capsule Take 1 capsule (500 mg total) by mouth 2 (two) times daily for 7 days. 03/09/23 03/16/23 Yes Airianna Kreischer, Eilleen Kempf, MD  cholecalciferol (VITAMIN D3) 25 MCG (1000 UNIT) tablet Take 1,000 Units by mouth daily.   Yes [provider]  clobetasol cream (TEMOVATE) 0.05 % Apply 1 Application topically 2 (two) times daily. 06/05/22  Yes Neomi Laidler, Eilleen Kempf, MD  cloNIDine (CATAPRES) 0.3 MG tablet TAKE 1 TABLET BY MOUTH TWICE  DAILY 11/06/22  Yes Quanesha Klimaszewski, Eilleen Kempf, MD   Continuous Blood Gluc Receiver (FREESTYLE LIBRE 2 READER) DEVI Use to check blood glucose. Use as directed 01/08/23  Yes Alcee Sipos, Eilleen Kempf, MD  Continuous Glucose Sensor (FREESTYLE LIBRE 2 SENSOR) MISC USE AS DIRECTED TO CHECK BLOOD  SUGAR DAILY 02/11/23  Yes Georgina Quint, MD  dronedarone (MULTAQ) 400 MG tablet Take 1 tablet (400 mg total) by mouth 2 (two) times daily. 01/19/23  Yes Duke Salvia, MD  Glucagon (GVOKE HYPOPEN 2-PACK) 0.5 MG/0.1ML SOAJ Inject 0.5 mg into the skin daily as needed. For Hypoglycemic events 05/30/22  Yes Karmello Abercrombie, Eilleen Kempf, MD  hydrOXYzine (ATARAX) 10 MG tablet Take 1 tablet (10 mg total) by mouth every 6 (six) hours as needed. 08/12/22  Yes Ronald Londo, Eilleen Kempf, MD  hydrOXYzine (VISTARIL) 25 MG capsule Take 1 capsule (25 mg total) by mouth every 8 (eight) hours as needed. 01/15/23  Yes Corwin Levins, MD  Insulin Lispro Prot & Lispro (HUMALOG MIX 75/25 KWIKPEN) (75-25) 100 UNIT/ML Kwikpen Inject 26 Units into the skin 2 (two) times daily before a meal. 12/12/22  Yes [name], Konrad Dolores, MD  ketorolac (ACULAR) 0.5 % ophthalmic solution SMARTSIG:In Eye(s) 08/22/21  Yes [provider]  levothyroxine (SYNTHROID) 112 MCG tablet Take 1 tablet (112 mcg total) by mouth daily. 12/31/22  Yes Esmond Hinch, Eilleen Kempf, MD  lidocaine (LIDODERM) 5 % Place 1 patch onto the skin daily. Remove & Discard patch within 12 hours or as directed  by MD 08/29/20  Yes Kathryne Hitch, MD  losartan (COZAAR) 100 MG tablet TAKE 1 TABLET BY MOUTH DAILY 10/13/22  Yes Delman Goshorn, Eilleen Kempf, MD  metoprolol tartrate (LOPRESSOR) 25 MG tablet Take 1 tablet (25 mg total) by mouth 2 (two) times daily. 10/29/22 10/24/23 Yes Hibba Schram, Eilleen Kempf, MD  nystatin powder Apply 1 Application topically 2 (two) times daily. 08/12/22  Yes Adin Lariccia, Eilleen Kempf, MD  [facility] VERIO test strip 1 each by Other route 3 (three) times daily. 06/08/20  Yes [provider]   oxyCODONE-acetaminophen (PERCOCET/ROXICET) 5-325 MG tablet Take 1 tablet by mouth every 6 (six) hours as needed for up to 5 days for severe pain. 03/09/23 03/14/23 Yes Jayron Maqueda, Eilleen Kempf, MD  predniSONE (DELTASONE) 10 MG tablet 3 tabs by mouth per day for 3 days,2tabs per day for 3 days,1tab per day for 3 days 01/15/23  Yes Corwin Levins, MD  RESTASIS MULTIDOSE 0.05 % ophthalmic emulsion Place 1 drop into both eyes 2 (two) times daily. 03/04/20  Yes [provider]  rosuvastatin (CRESTOR) 10 MG tablet TAKE 1 TABLET BY MOUTH IN THE  EVENING 02/11/23  Yes Oneita Allmon, Eilleen Kempf, MD  spironolactone (ALDACTONE) 25 MG tablet TAKE 1 TABLET BY MOUTH DAILY 02/11/23  Yes [name], Konrad Dolores, MD  tirzepatide [facility]) 5 MG/0.5ML Pen Inject 5 mg into the skin once a week. 12/12/22  Yes [name], Konrad Dolores, MD  TOBRADEX ophthalmic ointment  08/25/21  Yes [provider]  torsemide (DEMADEX) 20 MG tablet Take 1 tablet (20 mg total) by mouth daily. 01/19/23  Yes Duke Salvia, MD  triamcinolone cream (KENALOG) 0.1 % Apply 1 application topically 2 (two) times daily. 07/26/20  Yes Rizwan Kuyper, Eilleen Kempf, MD  vitamin B-12 (CYANOCOBALAMIN) 1000 MCG tablet Take 1,000 mcg by mouth daily.   Yes [provider]  ipratropium (ATROVENT HFA) 17 MCG/ACT inhaler Inhale 2 puffs into the lungs every 6 (six) hours as needed for wheezing. 12/17/21 02/09/23  Waymon Budge, MD  potassium chloride (KLOR-CON) 10 MEQ tablet Take 4 tablets (40 mEq total) by mouth daily. 12/10/21 03/06/22  Graciella Freer, PA-C    Allergies  Allergen Reactions   Doxycycline     Made patient feel very ill    Metformin Diarrhea    Patient Active Problem List   Diagnosis Date Noted   Lower respiratory infection 01/28/2023   Allergic rhinitis 01/15/2023   COVID-19 virus infection 01/07/2023   Stage 3b chronic kidney disease (HCC) 01/07/2023   Right hip pain 08/13/2022   Primary hyperaldosteronism  (HCC) 03/06/2022   Coronary artery disease involving native coronary artery of native heart without angina pectoris 02/12/2022   Adrenal adenoma, right 02/07/2022   Skin lesion of right arm 02/02/2022   Asthma 11/01/2021   Normocytic anemia 10/11/2021   Nodule of right lung 09/30/2021   Persistent cough 09/27/2021   Dysthymia 03/12/2021   Primary osteoarthritis involving multiple joints 03/01/2021   Recurrent falls 01/10/2021   Sensorineural hearing loss (SNHL), 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   Persistent atrial fibrillation (HCC) 08/12/2020   Type 2 diabetes mellitus with hyperglycemia, with long-term current use of insulin (HCC) 06/21/2020   Dyslipidemia associated with type 2 diabetes mellitus (HCC) 06/21/2020   Mixed hyperlipidemia 06/21/2020   Uncontrolled hypertension 06/11/2020   Aortic atherosclerosis (HCC) 03/28/2020   Diabetic peripheral neuropathy (HCC) 12/26/2019   Gastroesophageal reflux disease without esophagitis 03/17/2019   Rash and nonspecific skin eruption 03/17/2019   Pruritus 02/16/2019   Varicose veins of  both legs with edema 02/16/2019   Heart murmur 09/05/2018   Restrictive lung disease 02/22/2018   Dysgeusia 06/11/2017   Osteoarthritis of left knee 06/03/2017   NAFLD (nonalcoholic fatty liver disease) 40/98/1191   Osteoporosis 12/31/2015   Obesity 12/19/2015   Acquired hypothyroidism 10/18/2015   Prolonged QT interval 10/03/2013   CHF (congestive heart failure) (HCC) 04/21/2013   Long term (current) use of anticoagulants 06/09/2012   Musculoskeletal back pain    Acute lumbar myofascial strain 08/19/2011   BENIGN NEOPLASM OF ADRENAL GLAND 11/25/2010   Chronic diastolic heart failure (HCC) 02/20/2010   Diabetes (HCC) 05/16/2009   GOUT 05/16/2009   OBESITY, MORBID 05/16/2009   Hypertension associated with diabetes (HCC) 05/16/2009   Paroxysmal atrial fibrillation (HCC) 05/16/2009   HYPERLIPIDEMIA 11/30/2008   Obstructive sleep apnea on CPAP 11/30/2008     Past Medical History:  Diagnosis Date   Arthritis    Back pain    Chronic anticoagulation    due to aflutter   Chronic kidney disease    Diabetes mellitus    Diastolic CHF, chronic (HCC)    a.  echo 2006 - ef 55-65%; mild diast dysfxn;    b. Echo 08/2011: Mild LVH, EF 60%;  c. 04/2013 Echo: EF 65-69%, mild conc LVH;  08/2014 Echo: EF 60-65%, mild-mod MR.   Gout    Hyperlipidemia    Hypertension    a.  Renal arterial Dopplers 12/2011: 1-59% right renal artery stenosis   Morbid obesity (HCC)    Obstructive sleep apnea on CPAP    Paroxysmal Afib/Flutter    a. 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    Past Surgical History:  Procedure Laterality Date   APPENDECTOMY     ATRIAL FLUTTER ABLATION N/A 09/24/2011   Procedure: ATRIAL FLUTTER ABLATION;  Surgeon: Marinus Maw, MD;  Location: [facility] CATH LAB;  Service: Cardiovascular;  Laterality: N/A;   CARDIOVERSION  10/22/2011   Procedure: CARDIOVERSION;  Surgeon: Duke Salvia, MD;  Location[facility] OR;  Service: Cardiovascular;  Laterality: N/A;   CARDIOVERSION N/A 09/10/2011   Procedure: CARDIOVERSION;  Surgeon: Duke Salvia, MD;  Location[facility] CATH LAB;  Service: Cardiovascular;  Laterality: N/A;   CHOLECYSTECTOMY     COLONOSCOPY WITH PROPOFOL N/A 06/13/2021   Procedure: COLONOSCOPY WITH PROPOFOL;  Surgeon: Rachael Fee, MD;  Location: [facility] ENDOSCOPY;  Service: Endoscopy;  Laterality: N/A;   POLYPECTOMY  06/13/2021   Procedure: POLYPECTOMY;  Surgeon: Rachael Fee, MD;  Location: [facility] ENDOSCOPY;  Service: Endoscopy;;   TONSILLECTOMY  1982   TOTAL ABDOMINAL HYSTERECTOMY      Social History   Socioeconomic History   Marital status: Married    Spouse name: Not on file   Number of children: 3   Years of education: Not on file   Highest education level: Not on file  Occupational History   Occupation: DISABILITY/housewife    Employer: RETIRED  Tobacco Use   Smoking status: Never   Smokeless tobacco: Never  Vaping Use   Vaping  Use: Never used  Substance and Sexual Activity   Alcohol use: No   Drug use: No   Sexual activity: Yes  Other Topics Concern   Not on file  Social History Narrative   Not on file   Social Determinants of Health   Financial Resource Strain: Low Risk  (11/21/2022)   Overall Financial Resource Strain (CARDIA)    Difficulty of Paying Living Expenses: Not hard at all  Food Insecurity: No Food Insecurity (11/21/2022)  Hunger Vital Sign    Worried About Running Out of Food in the Last Year: Never true    Ran Out of Food in the Last Year: Never true  Transportation Needs: No Transportation Needs (11/21/2022)   PRAPARE - Administrator, Civil Service (Medical): No    Lack of Transportation (Non-Medical): No  Physical Activity: Inactive (11/21/2022)   Exercise Vital Sign    Days of Exercise per Week: 0 days    Minutes of Exercise per Session: 0 min  Stress: No Stress Concern Present (11/21/2022)   [employer] of Occupational Health - Occupational Stress Questionnaire    Feeling of Stress : Not at all  Social Connections: Socially Integrated (11/21/2022)   Social Connection and Isolation Panel [NHANES]    Frequency of Communication with Friends and Family: More than three times a week    Frequency of Social Gatherings with Friends and Family: Never    Attends Religious Services: More than 4 times per year    Active Member of [employer] or Organizations: Yes    Attends Engineer, structural: More than 4 times per year    Marital Status: Married  Catering manager Violence: Not At Risk (11/21/2022)   Humiliation, Afraid, Rape, and Kick questionnaire    Fear of Current or Ex-Partner: No    Emotionally Abused: No    Physically Abused: No    Sexually Abused: No    Family History  Problem Relation Age of Onset   Heart disease Father    Hypertension Father    Breast cancer Sister    Cancer Sister        breast   Colon cancer Neg Hx    Esophageal cancer Neg Hx    Pancreatic  cancer Neg Hx    Liver disease Neg Hx      Review of Systems  Constitutional: Negative.  Negative for chills and fever.  HENT: Negative.  Negative for congestion and sore throat.   Respiratory: Negative.  Negative for cough and shortness of breath.   Cardiovascular: Negative.  Negative for chest pain and palpitations.  Gastrointestinal:  Negative for nausea and vomiting.  Musculoskeletal:  Positive for joint pain (Right knee).  Skin:  Positive for rash.       Right knee abrasion  Neurological: Negative.  Negative for dizziness and headaches.  All other systems reviewed and are negative.   Vitals:   03/09/23 1555  BP: 126/80  Pulse: 62  Temp: 97.8 F (36.6 C)  SpO2: 98%    Physical Exam Vitals reviewed.  Constitutional:      Appearance: She is obese.  HENT:     Head: Normocephalic.  Eyes:     Extraocular Movements: Extraocular movements intact.  Cardiovascular:     Rate and Rhythm: Normal rate.  Pulmonary:     Effort: Pulmonary effort is normal.  Musculoskeletal:     Comments: Right knee: Swelling and tenderness  Skin:    General: Skin is warm and dry.     Comments: Right knee: Positive abrasion with surrounding erythema and swelling.  Very tender to touch.  Infected.  Neurological:     Mental Status: She is alert and oriented to person, place, and time.  Psychiatric:        Mood and Affect: Mood normal.        Behavior: Behavior normal.      DG Knee 1-2 Views Right  Result Date: 03/09/2023 CLINICAL DATA:  Fall onto right knee.  Right knee injury. EXAM: RIGHT KNEE - 1-2 VIEW COMPARISON:  Radiographs 08/21/2020. FINDINGS: Patient is rotated on both views. The bones appear adequately mineralized. No evidence of acute fracture or dislocation. Stable mild medial and patellofemoral compartment joint space narrowing. No significant joint effusion or other focal soft tissue abnormality identified. IMPRESSION: No acute osseous findings identified. Mild degenerative  changes. Electronically Signed   By: Carey Bullocks M.D.   On: 03/09/2023 16:34    ASSESSMENT & PLAN: A total of 35 minutes was spent with the patient and counseling/coordination of care regarding preparing for this visit, review of most recent office visit notes, review of multiple chronic medical conditions under management, review of all medications, diagnosis of infected abrasion and need to start antibiotics, pain management, prognosis, ED precautions, documentation and need for follow-up if no better or worse during the next several days.  Problem List Items Addressed This Visit       Musculoskeletal and Integument   Abrasion of right knee     Other   Contusion of right knee - Primary    Unremarkable x-ray.  No fracture. Pain management discussed Recommend Tylenol for mild to moderate pain and Percocet for moderate to severe pain Advised to keep leg elevated when possible      Relevant Orders   DG Knee 1-2 Views Right (Completed)   Infected abrasion    Diabetic with infected abrasion Wound care management discussed Recommend to start Duricef 500 mg twice a day along with topical antibiotic Advised to contact the office if no better or worse during the next several days      Relevant Medications   cefadroxil (DURICEF) 500 MG capsule   Patient Instructions  Contusion A contusion is a deep bruise. This is a result of an injury that causes bleeding under the skin. Symptoms of bruising include pain, swelling, and discolored skin. The skin may turn blue, purple, or yellow. Follow these instructions at home: Managing pain, stiffness, and swelling You may use RICE. This stands for: Resting. Icing. Compression, or putting pressure on the injured area. Elevating, or raising the injured area. To follow this method, do these actions: Rest the injured area. If told, put ice on the injured area. To do this: Put ice in a plastic bag. Place a towel between your skin and the  bag. Leave the ice on for 20 minutes, 2-3 times per day. If your skin turns bright red, take off the ice right away to prevent skin damage. The risk of skin damage is higher if you cannot feel pain, heat, or cold. If told, apply compression on the injured area using an elastic bandage. Make sure the bandage is not too tight. If the area tingles or has a loss of feeling (numbness), remove it and put it back on as told by your doctor. If possible, elevate the injured area above the level of your heart while you are sitting or lying down.  General instructions Take over-the-counter and prescription medicines only as told by your doctor. Keep all follow-up visits. Your doctor may want to see how your contusion is healing with treatment. Contact a doctor if: Your symptoms do not get better after several days of treatment. Your symptoms get worse. You have trouble moving the injured area. Get help right away if: You have very bad pain. You have a loss of feeling (numbness) in a hand or foot. Your hand or foot turns pale or cold. This information is not intended to replace advice  given to you by your health care provider. Make sure you discuss any questions you have with your health care provider. Document Revised: 03/24/2022 Document Reviewed: 03/24/2022 Elsevier Patient Education  2023 Elsevier Inc.     Edwina Barth, MD [facility]

## 2023-03-09 NOTE — Telephone Encounter (Signed)
Patient states her endocrinologist mentioned that she would increase her Mounjaro after 2 months if she can tolerated the dose. She wants to make you aware of this and to see if its ok. Please advise

## 2023-03-09 NOTE — Assessment & Plan Note (Signed)
Diabetic with infected abrasion Wound care management discussed Recommend to start Duricef 500 mg twice a day along with topical antibiotic Advised to contact the office if no better or worse during the next several days

## 2023-03-09 NOTE — Telephone Encounter (Signed)
Okay to increase Mounjaro dose.

## 2023-03-09 NOTE — Patient Instructions (Signed)
Contusion A contusion is a deep bruise. This is a result of an injury that causes bleeding under the skin. Symptoms of bruising include pain, swelling, and discolored skin. The skin may turn blue, purple, or yellow. Follow these instructions at home: Managing pain, stiffness, and swelling You may use RICE. This stands for: Resting. Icing. Compression, or putting pressure on the injured area. Elevating, or raising the injured area. To follow this method, do these actions: Rest the injured area. If told, put ice on the injured area. To do this: Put ice in a plastic bag. Place a towel between your skin and the bag. Leave the ice on for 20 minutes, 2-3 times per day. If your skin turns bright red, take off the ice right away to prevent skin damage. The risk of skin damage is higher if you cannot feel pain, heat, or cold. If told, apply compression on the injured area using an elastic bandage. Make sure the bandage is not too tight. If the area tingles or has a loss of feeling (numbness), remove it and put it back on as told by your doctor. If possible, elevate the injured area above the level of your heart while you are sitting or lying down.  General instructions Take over-the-counter and prescription medicines only as told by your doctor. Keep all follow-up visits. Your doctor may want to see how your contusion is healing with treatment. Contact a doctor if: Your symptoms do not get better after several days of treatment. Your symptoms get worse. You have trouble moving the injured area. Get help right away if: You have very bad pain. You have a loss of feeling (numbness) in a hand or foot. Your hand or foot turns pale or cold. This information is not intended to replace advice given to you by your health care provider. Make sure you discuss any questions you have with your health care provider. Document Revised: 03/24/2022 Document Reviewed: 03/24/2022 Elsevier Patient Education  2023  Elsevier Inc.  

## 2023-03-10 NOTE — Telephone Encounter (Signed)
Called patient and informed her of provider response.  

## 2023-03-17 ENCOUNTER — Ambulatory Visit
Admission: RE | Admit: 2023-03-17 | Discharge: 2023-03-17 | Disposition: A | Discharge status: Home / Self Care | Payer: Medicare Other | Source: Ambulatory Visit | Attending: Emergency Medicine | Admitting: Emergency Medicine

## 2023-03-17 DIAGNOSIS — Z1231 Encounter for screening mammogram for malignant neoplasm of breast: Secondary | ICD-10-CM | POA: Diagnosis not present

## 2023-03-30 DIAGNOSIS — H538 Other visual disturbances: Secondary | ICD-10-CM | POA: Diagnosis not present

## 2023-03-30 DIAGNOSIS — H04129 Dry eye syndrome of unspecified lacrimal gland: Secondary | ICD-10-CM | POA: Diagnosis not present

## 2023-03-30 DIAGNOSIS — E119 Type 2 diabetes mellitus without complications: Secondary | ICD-10-CM | POA: Diagnosis not present

## 2023-04-01 ENCOUNTER — Ambulatory Visit (INDEPENDENT_AMBULATORY_CARE_PROVIDER_SITE_OTHER): Payer: Medicare Other | Admitting: Dermatology

## 2023-04-01 VITALS — BP 134/73

## 2023-04-01 DIAGNOSIS — C44311 Basal cell carcinoma of skin of nose: Secondary | ICD-10-CM

## 2023-04-01 DIAGNOSIS — C44319 Basal cell carcinoma of skin of other parts of face: Secondary | ICD-10-CM | POA: Diagnosis not present

## 2023-04-01 DIAGNOSIS — C4491 Basal cell carcinoma of skin, unspecified: Secondary | ICD-10-CM

## 2023-04-01 DIAGNOSIS — D485 Neoplasm of uncertain behavior of skin: Secondary | ICD-10-CM

## 2023-04-01 DIAGNOSIS — L304 Erythema intertrigo: Secondary | ICD-10-CM

## 2023-04-01 HISTORY — DX: Basal cell carcinoma of skin, unspecified: C44.91

## 2023-04-01 MED ORDER — NYSTATIN-TRIAMCINOLONE 100000-0.1 UNIT/GM-% EX OINT: TOPICAL_OINTMENT | CUTANEOUS | 1 refills | Status: DC

## 2023-04-01 NOTE — Progress Notes (Signed)
   New Patient Visit   Subjective  [name] is a 82 y.o. female who presents for the following: rash at face, started about 4 months ago. No new soaps, no new creams, asymptomatic for patient. Patient c/o bumps under breasts that are new, sometimes itchy. Also with a new spot at chin, was smaller but has gotten larger, noticed 5-6 months ago. No hx skin cancer.     The following portions of the chart were reviewed this encounter and updated as appropriate: medications, allergies, medical history  Review of Systems:  No other skin or systemic complaints except as noted in HPI or Assessment and Plan.  Objective  Well appearing patient in no apparent distress; mood and affect are within normal limits.   A focused examination was performed of the following areas:  Relevant exam findings are noted in the Assessment and Plan.  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 Once confirmed can treat cosmetically with cautery.  HEMANGIOMA Exam: red papule(s) Discussed benign nature. Recommend observation. Call for changes.  INTERTRIGO Exam Erythematous macerated patches   Intertrigo is a chronic recurrent rash that occurs in skin fold areas that may be associated with friction; heat; moisture; yeast; fungus; and bacteria.  It is exacerbated by increased movement / activity; sweating; and higher atmospheric temperature.  Treatment Plan Start nystatin-triamcinolone cream twice daily to affected areas for up to 7 days then discontinue. May restart as needed for flares.    Neoplasm of uncertain behavior of skin right lower chin  Skin / nail biopsy Type of biopsy: tangential   Informed consent: discussed and consent obtained   Timeout: patient  name, date of birth, surgical site, and procedure verified   Procedure prep:  Patient was prepped and draped in usual sterile fashion Prep type:  Isopropyl alcohol Anesthesia: the lesion was anesthetized in a standard fashion   Anesthetic:  1% lidocaine w/ epinephrine 1-100,000 buffered w/ 8.4% NaHCO3 Instrument used: DermaBlade   Hemostasis achieved with: aluminum chloride   Outcome: patient tolerated procedure well   Post-procedure details: sterile dressing applied and wound care instructions given   Dressing type: petrolatum gauze and bandage      Return if symptoms worsen or fail to improve.  Anise Salvo, RMA, am acting as scribe for [employer], DO .   Documentation: I have reviewed the above documentation for accuracy and completeness, and I agree with the above.  Langston Reusing, DO

## 2023-04-01 NOTE — Patient Instructions (Signed)
Patient Handout: Wound Care for Skin Biopsy Site  Patient Handout: Wound Care for Skin Biopsy Site  Taking Care of Your Skin Biopsy Site  Proper care of the biopsy site is essential for promoting healing and minimizing scarring. This handout provides instructions on how to care for your biopsy site to ensure optimal recovery.  1. Cleaning the Wound:  Clean the biopsy site daily with gentle soap and water. Gently pat the area dry with a clean, soft towel. Avoid harsh scrubbing or rubbing the area, as this can irritate the skin and delay healing.  2. Applying Aquaphor and Bandage:  After cleaning the wound, apply a thin layer of Aquaphor ointment to the biopsy site. Cover the area with a sterile bandage to protect it from dirt, bacteria, and friction. Change the bandage daily or as needed if it becomes soiled or wet.  3. Continued Care for One Week:  Repeat the cleaning, Aquaphor application, and bandaging process daily for one week following the biopsy procedure. Keeping the wound clean and moist during this initial healing period will help prevent infection and promote optimal healing.  4. Massaging Aquaphor into the Area:  ---After one week, discontinue the use of bandages but continue to apply Aquaphor to the biopsy site. ----Gently massage the Aquaphor into the area using circular motions. ---Massaging the skin helps to promote circulation and prevent the formation of scar tissue.   Additional Tips:  Avoid exposing the biopsy site to direct sunlight during the healing process, as this can cause hyperpigmentation or worsen scarring. If you experience any signs of infection, such as increased redness, swelling, warmth, or drainage from the wound, contact your healthcare provider immediately. Follow any additional instructions provided by your healthcare provider for caring for the biopsy site and managing any discomfort. Conclusion:  Taking proper care of your skin biopsy site  is crucial for ensuring optimal healing and minimizing scarring. By following these instructions for cleaning, applying Aquaphor, and massaging the area, you can promote a smooth and successful recovery. If you have any questions or concerns about caring for your biopsy site, don't hesitate to contact your healthcare provider for guidance.      Due to recent changes in healthcare laws, you may see results of your pathology and/or laboratory studies on MyChart before the doctors have had a chance to review them. We understand that in some cases there may be results that are confusing or concerning to you. Please understand that not all results are received at the same time and often the doctors may need to interpret multiple results in order to provide you with the best plan of care or course of treatment. Therefore, we ask that you please give us 2 business days to thoroughly review all your results before contacting the office for clarification. Should we see a critical lab result, you will be contacted sooner.   If You Need Anything After Your Visit  If you have any questions or concerns for your doctor, please call our main line at [phone] If no one answers, please leave a voicemail as directed and we will return your call as soon as possible. Messages left after 4 pm will be answered the following business day.   You may also send us a message via MyChart. We typically respond to MyChart messages within 1-2 business days.  For prescription refills, please ask your pharmacy to contact our office. Our fax number is [phone].  If you have an urgent issue when the clinic is   closed that cannot wait until the next business day, you can page your doctor at the number below.    Please note that while we do our best to be available for urgent issues outside of office hours, we are not available 24/7.   If you have an urgent issue and are unable to reach us, you may choose to seek medical care  at your doctor's office, retail clinic, urgent care center, or emergency room.  If you have a medical emergency, please immediately call 911 or go to the emergency department. In the event of inclement weather, please call our main line at [phone] for an update on the status of any delays or closures.  Dermatology Medication Tips: Please keep the boxes that topical medications come in in order to help keep track of the instructions about where and how to use these. Pharmacies typically print the medication instructions only on the boxes and not directly on the medication tubes.   If your medication is too expensive, please contact our office at [phone] or send us a message through MyChart.   We are unable to tell what your co-pay for medications will be in advance as this is different depending on your insurance coverage. However, we may be able to find a substitute medication at lower cost or fill out paperwork to get insurance to cover a needed medication.   If a prior authorization is required to get your medication covered by your insurance company, please allow us 1-2 business days to complete this process.  Drug prices often vary depending on where the prescription is filled and some pharmacies may offer cheaper prices.  The website www.goodrx.com contains coupons for medications through different pharmacies. The prices here do not account for what the cost may be with help from insurance (it may be cheaper with your insurance), but the website can give you the price if you did not use any insurance.  - You can print the associated coupon and take it with your prescription to the pharmacy.  - You may also stop by our office during regular business hours and pick up a GoodRx coupon card.  - If you need your prescription sent electronically to a different pharmacy, notify our office through [facility] MyChart or by phone at [phone]     

## 2023-04-07 ENCOUNTER — Telehealth: Payer: Self-pay

## 2023-04-07 DIAGNOSIS — C44310 Basal cell carcinoma of skin of unspecified parts of face: Secondary | ICD-10-CM

## 2023-04-07 NOTE — Telephone Encounter (Signed)
LVM for patient to call office for bx results. Referral sent to [facility]. Butch Penny., RMA

## 2023-04-07 NOTE — Progress Notes (Signed)
Please call patient and notify that results of biopsy were positive for a skin cancer that needs to be treated with Mohs Surgery.  Diagnosis Skin , right lower chin BASAL CELL CARCINOMA, NODULAR PATTERN  We will refer to Dr. Jeannine Boga or Dr. Daphine Deutscher at [facility]  [facility] [address] [place], [address] [place], [place] [address]  Phone: [phone] Fax: [phone]

## 2023-04-08 ENCOUNTER — Other Ambulatory Visit: Payer: Self-pay | Admitting: Emergency Medicine

## 2023-04-08 DIAGNOSIS — E039 Hypothyroidism, unspecified: Secondary | ICD-10-CM

## 2023-04-09 ENCOUNTER — Telehealth: Payer: Self-pay | Admitting: Emergency Medicine

## 2023-04-09 ENCOUNTER — Ambulatory Visit (INDEPENDENT_AMBULATORY_CARE_PROVIDER_SITE_OTHER): Payer: Medicare Other

## 2023-04-09 ENCOUNTER — Ambulatory Visit: Payer: Medicare Other | Admitting: Sports Medicine

## 2023-04-09 VITALS — BP 134/74 | Ht 61.0 in | Wt 269.0 lb

## 2023-04-09 DIAGNOSIS — M25511 Pain in right shoulder: Secondary | ICD-10-CM

## 2023-04-09 DIAGNOSIS — M7541 Impingement syndrome of right shoulder: Secondary | ICD-10-CM

## 2023-04-09 DIAGNOSIS — M19011 Primary osteoarthritis, right shoulder: Secondary | ICD-10-CM | POA: Diagnosis not present

## 2023-04-09 DIAGNOSIS — E039 Hypothyroidism, unspecified: Secondary | ICD-10-CM

## 2023-04-09 DIAGNOSIS — G8929 Other chronic pain: Secondary | ICD-10-CM

## 2023-04-09 MED ORDER — LEVOTHYROXINE SODIUM 112 MCG PO TABS
112.0000 ug | ORAL_TABLET | Freq: Every day | ORAL | 3 refills | Status: DC
Start: 2023-04-09 — End: 2023-11-13

## 2023-04-09 NOTE — Telephone Encounter (Signed)
Prescription Request  04/09/2023  LOV: 03/09/2023  What is the name of the medication or equipment? levothyroxine (SYNTHROID) 112 MCG tablet   Have you contacted your pharmacy to request a refill? No   Which pharmacy would you like this sent to?    [id number] - [place], [place] - [address] [place] [address] Phone: [phone] Fax: [phone] Patient notified that their request is being sent to the clinical staff for review and that they should receive a response within 2 business days.   Please advise at Mobile [phone] (mobile)

## 2023-04-09 NOTE — Patient Instructions (Signed)
Shoulder HEP 3-4 week follow up  

## 2023-04-09 NOTE — Telephone Encounter (Signed)
Informed patient that a year supply was sent to her pharmacy on 12/2022. Patient states she called pharmacy and has no refill. I sent in a new prescription to her pharmacy.

## 2023-04-09 NOTE — Progress Notes (Signed)
[name] D.[name] [address] [place] [address] Phone: [phone]   Assessment and Plan:     1. Chronic right shoulder pain 2. Primary osteoarthritis of right shoulder 3. Impingement syndrome, shoulder, right  -Chronic with exacerbation, initial sports medicine visit - Most consistent with impingement syndrome of right shoulder.  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  My interpretation: Degenerative changes along humeral head and glenoid.  Decreased AC joint space with degenerative changes.  No acute fracture or dislocation - Discussed that I do not recommend NSAID use due to chronic anticoagulation on Eliquis, and I do not recommend prednisone use due to DM type II - Patient elected for subacromial CSI.  Tolerated well per note below.  CSI may temporarily increase blood glucose in patient with past medical history DM type II - Start HEP for shoulder  Procedure: Subacromial Injection Side: Right  Risks explained and consent was given verbally. The site was cleaned with alcohol prep. A steroid injection was performed from posterior approach using 2mL of 1% lidocaine without epinephrine and 1mL of kenalog 40mg /ml. This was well tolerated and resulted in symptomatic relief.  Needle was removed, hemostasis achieved, and post injection instructions were explained.   Pt was advised to call or return to clinic if these symptoms worsen or fail to improve as anticipated.   Pertinent previous records reviewed include none   Follow Up: 3 to 4 weeks for reevaluation.  Could consider AC joint versus glenohumeral CSI if no improvement or worsening of symptoms.  Could consider physical therapy   Subjective:   I, Moenique Parris, am serving as a  Neurosurgeon for Doctor Richardean Sale  Chief Complaint: right shoulder pain   HPI:   04/09/23 Patient is a 82 year old female complaining of right shoulder pain. Patient states that she fell down a month ago , thinks she had pain before she fell. She has pain with all movements. PCP rx a pain pill but she hasn't gotten a refill yet. She has pain when trying to sleep . Decreased ROM, pain radiates down to the bicep. Decreased strength.   Relevant Historical Information: DM type II, chronic anticoagulation on Eliquis, hypertension, paroxysmal atrial fibrillation  Additional pertinent review of systems negative.   Current Outpatient Medications:    Accu-Chek Softclix Lancets lancets, 1 each by Other route 3 (three) times daily. as directed, Disp: 100 each, Rfl: 3   allopurinol (ZYLOPRIM) 300 MG tablet, TAKE 1 TABLET BY MOUTH DAILY, Disp: 100 tablet, Rfl: 2   apixaban (ELIQUIS) 5 MG TABS tablet, TAKE 1 TABLET BY MOUTH TWICE  DAILY, Disp: 200 tablet, Rfl: 1   azelastine (ASTELIN) 0.1 % nasal spray, 1-2 puffs each nostril at bedtime as needed, Disp: 30 mL, Rfl: 12   b complex vitamins capsule, Take 1 capsule by mouth daily., Disp: , Rfl:    BD PEN NEEDLE NANO 2ND GEN 32G X 4 MM MISC, USE TWICE DAILY AS NEEDED, Disp: 100 each, Rfl: 11   blood glucose meter kit and supplies, Dispense based on patient and insurance preference. Use up to four times daily as directed. (FOR ICD-10 E10.9, E11.9)., Disp: 1 each, Rfl: 0   cholecalciferol (VITAMIN D3) 25 MCG (1000 UNIT) tablet, Take 1,000 Units by mouth daily., Disp: ,  Rfl:    clobetasol cream (TEMOVATE) 0.05 %, Apply 1 Application topically 2 (two) times daily., Disp: 30 g, Rfl: 1   cloNIDine (CATAPRES) 0.3 MG tablet, TAKE 1 TABLET BY MOUTH TWICE  DAILY, Disp: 200 tablet, Rfl: 2   Continuous Blood Gluc Receiver (FREESTYLE LIBRE 2 READER) DEVI, Use to check blood glucose. Use as directed, Disp: 1 each, Rfl: 3   Continuous Glucose Sensor (FREESTYLE LIBRE 2  SENSOR) MISC, USE AS DIRECTED TO CHECK BLOOD  SUGAR DAILY, Disp: 8 each, Rfl: 2   dronedarone (MULTAQ) 400 MG tablet, Take 1 tablet (400 mg total) by mouth 2 (two) times daily., Disp: 180 tablet, Rfl: 2   Glucagon (GVOKE HYPOPEN 2-PACK) 0.5 MG/0.1ML SOAJ, Inject 0.5 mg into the skin daily as needed. For Hypoglycemic events, Disp: 0.2 mL, Rfl: 5   hydrOXYzine (ATARAX) 10 MG tablet, Take 1 tablet (10 mg total) by mouth every 6 (six) hours as needed., Disp: 30 tablet, Rfl: 1   hydrOXYzine (VISTARIL) 25 MG capsule, Take 1 capsule (25 mg total) by mouth every 8 (eight) hours as needed., Disp: 30 capsule, Rfl: 3   Insulin Lispro Prot & Lispro (HUMALOG MIX 75/25 KWIKPEN) (75-25) 100 UNIT/ML Kwikpen, Inject 26 Units into the skin 2 (two) times daily before a meal., Disp: 60 mL, Rfl: 2   ipratropium (ATROVENT HFA) 17 MCG/ACT inhaler, Inhale 2 puffs into the lungs every 6 (six) hours as needed for wheezing., Disp: 1 each, Rfl: 2   ketorolac (ACULAR) 0.5 % ophthalmic solution, SMARTSIG:In Eye(s), Disp: , Rfl:    levothyroxine (SYNTHROID) 112 MCG tablet, Take 1 tablet (112 mcg total) by mouth daily., Disp: 90 tablet, Rfl: 3   lidocaine (LIDODERM) 5 %, Place 1 patch onto the skin daily. Remove & Discard patch within 12 hours or as directed by MD, Disp: 30 patch, Rfl: 0   losartan (COZAAR) 100 MG tablet, TAKE 1 TABLET BY MOUTH DAILY, Disp: 70 tablet, Rfl: 4   metoprolol tartrate (LOPRESSOR) 25 MG tablet, Take 1 tablet (25 mg total) by mouth 2 (two) times daily., Disp: 180 tablet, Rfl: 3   nystatin powder, Apply 1 Application topically 2 (two) times daily., Disp: 60 g, Rfl: 7   nystatin-triamcinolone ointment (MYCOLOG), For up to 1 week as needed for rash them discontinue. May restart as needed for flares. Avoid applying to face, groin, and axilla. Use as directed. Long-term use can cause thinning of the skin., Disp: 30 g, Rfl: 1   ONETOUCH VERIO test strip, 1 each by Other route 3 (three) times daily., Disp: , Rfl:     predniSONE (DELTASONE) 10 MG tablet, 3 tabs by mouth per day for 3 days,2tabs per day for 3 days,1tab per day for 3 days, Disp: 18 tablet, Rfl: 0   RESTASIS MULTIDOSE 0.05 % ophthalmic emulsion, Place 1 drop into both eyes 2 (two) times daily., Disp: , Rfl:    rosuvastatin (CRESTOR) 10 MG tablet, TAKE 1 TABLET BY MOUTH IN THE  EVENING, Disp: 100 tablet, Rfl: 2   spironolactone (ALDACTONE) 25 MG tablet, TAKE 1 TABLET BY MOUTH DAILY, Disp: 100 tablet, Rfl: 2   tirzepatide (MOUNJARO) 5 MG/0.5ML Pen, Inject 5 mg into the skin once a week., Disp: 6 mL, Rfl: 3   TOBRADEX ophthalmic ointment, , Disp: , Rfl:    torsemide (DEMADEX) 20 MG tablet, Take 1 tablet (20 mg total) by mouth daily., Disp: 90 tablet, Rfl: 2   triamcinolone cream (KENALOG) 0.1 %, Apply 1 application topically 2 (two)  times daily., Disp: 30 g, Rfl: 0   vitamin B-12 (CYANOCOBALAMIN) 1000 MCG tablet, Take 1,000 mcg by mouth daily., Disp: , Rfl:    Objective:     Vitals:   04/09/23 1255  BP: 134/74  Weight: 269 lb (122 kg)  Height: 5\' 1"  (1.549 m)      Body mass index is 50.83 kg/m.    Physical Exam:    Gen: Appears well, nad, nontoxic and pleasant Neuro:sensation intact, strength is 5/5 with df/pf/inv/ev, muscle tone wnl Skin: no suspicious lesion or defmority Psych: A&O, appropriate mood and affect  Right shoulder:  No deformity, swelling or muscle wasting No scapular winging FF 80, abd 80, int 25 , ext 70 Mild TTP diffusely over shoulder, trapezius, upper arm Special testing limited due to ROM Negative Spurling's test bilat FROM of neck    Electronically signed by:  [name] D.[name] 2:05 PM 04/09/23

## 2023-04-09 NOTE — Telephone Encounter (Signed)
Patient came in to office upset that medication has not been refilled. Patient has not taken medication for two days because she is out. I informed patient that office policy is that refills can take 24-48 hours to process and to let us know ahead of time in the future to avoid gaps in medication. Please call patient at [phone] when medication has been sent to pharmacy.

## 2023-04-13 ENCOUNTER — Other Ambulatory Visit: Payer: Self-pay

## 2023-04-13 DIAGNOSIS — L304 Erythema intertrigo: Secondary | ICD-10-CM

## 2023-04-13 MED ORDER — HYDROCORTISONE 2.5 % EX CREA
TOPICAL_CREAM | Freq: Two times a day (BID) | CUTANEOUS | 0 refills | Status: DC | PRN
Start: 2023-04-13 — End: 2023-08-10

## 2023-04-13 MED ORDER — KETOCONAZOLE 2 % EX CREA
1.0000 | TOPICAL_CREAM | Freq: Two times a day (BID) | CUTANEOUS | 1 refills | Status: AC
Start: 2023-04-13 — End: 2023-08-11

## 2023-04-14 ENCOUNTER — Telehealth: Payer: Self-pay

## 2023-04-14 NOTE — Telephone Encounter (Signed)
Patient informed of biopsy results

## 2023-04-15 DIAGNOSIS — N2581 Secondary hyperparathyroidism of renal origin: Secondary | ICD-10-CM | POA: Diagnosis not present

## 2023-04-15 DIAGNOSIS — N179 Acute kidney failure, unspecified: Secondary | ICD-10-CM | POA: Diagnosis not present

## 2023-04-19 ENCOUNTER — Encounter: Payer: Self-pay | Admitting: Dermatology

## 2023-04-21 ENCOUNTER — Telehealth: Payer: Self-pay | Admitting: Emergency Medicine

## 2023-04-21 ENCOUNTER — Other Ambulatory Visit: Payer: Self-pay | Admitting: Emergency Medicine

## 2023-04-21 DIAGNOSIS — C44319 Basal cell carcinoma of skin of other parts of face: Secondary | ICD-10-CM | POA: Diagnosis not present

## 2023-04-21 MED ORDER — HYDROCODONE-ACETAMINOPHEN 5-325 MG PO TABS: 1.0000 | ORAL_TABLET | Freq: Four times a day (QID) | ORAL | 0 refills | Status: DC | PRN

## 2023-04-21 NOTE — Telephone Encounter (Signed)
New prescription for Norco sent to pharmacy of record today.  Thanks.

## 2023-04-21 NOTE — Telephone Encounter (Signed)
FYI, patient called and informed us that she had surgery to remove a mole and pathology came back and it was cancerous. Patient is in a lot of pain and wanted PCP to prescribe pain medication. I informed patient to call the dermatology office that done her surgery to inquire about pain medication.

## 2023-04-21 NOTE — Telephone Encounter (Signed)
Patient called wanting Dr. Alvy Bimler to call her.  Concerning surgery that she had today.  Patient was very hateful when I told her that the nurse or CMA would give her a call back.  Patient would not state what she needed to talk to him about concerning the surgery.  Patient's number:  [phone]

## 2023-04-22 NOTE — Telephone Encounter (Signed)
Called patient to inform her of medication refill

## 2023-04-27 ENCOUNTER — Other Ambulatory Visit: Payer: Self-pay | Admitting: Internal Medicine

## 2023-04-27 DIAGNOSIS — I480 Paroxysmal atrial fibrillation: Secondary | ICD-10-CM

## 2023-04-28 NOTE — Telephone Encounter (Signed)
Prescription refill request for Eliquis received. Indication:afib Last office visit:12/23 Scr:1.52  5/24 Age: 82 Weight:122  kg  Prescription refilled

## 2023-04-29 NOTE — Progress Notes (Unsigned)
Aleen Sells D.[name] [address] [place] [address] Phone: [phone]   Assessment and Plan:     There are no diagnoses linked to this encounter.  ***   Pertinent previous records reviewed include ***   Follow Up: ***     Subjective:   I, [name], am serving as a Neurosurgeon for Doctor Richardean Sale   Chief Complaint: right shoulder pain    HPI:    04/09/23 Patient is a 82 year old female complaining of right shoulder pain. Patient states that she fell down a month ago , thinks she had pain before she fell. She has pain with all movements. PCP rx a pain pill but she hasn't gotten a refill yet. She has pain when trying to sleep . Decreased ROM, pain radiates down to the bicep. Decreased strength.   04/30/2023 Patient states    Relevant Historical Information: DM type II, chronic anticoagulation on Eliquis, hypertension, paroxysmal atrial fibrillation  Additional pertinent review of systems negative.   Current Outpatient Medications:    Accu-Chek Softclix Lancets lancets, 1 each by Other route 3 (three) times daily. as directed, Disp: 100 each, Rfl: 3   allopurinol (ZYLOPRIM) 300 MG tablet, TAKE 1 TABLET BY MOUTH DAILY, Disp: 100 tablet, Rfl: 2   azelastine (ASTELIN) 0.1 % nasal spray, 1-2 puffs each nostril at bedtime as needed, Disp: 30 mL, Rfl: 12   b complex vitamins capsule, Take 1 capsule by mouth daily., Disp: , Rfl:    BD PEN NEEDLE NANO 2ND GEN 32G X 4 MM MISC, USE TWICE DAILY AS NEEDED, Disp: 100 each, Rfl: 11   blood glucose meter kit and supplies, Dispense based on patient and insurance preference. Use up to four times daily as directed. (FOR ICD-10 E10.9, E11.9)., Disp: 1 each, Rfl: 0   cholecalciferol (VITAMIN D3) 25 MCG (1000 UNIT) tablet, Take 1,000 Units by mouth daily., Disp: , Rfl:    clobetasol cream (TEMOVATE) 0.05 %, Apply 1 Application topically 2 (two) times daily., Disp: 30 g, Rfl: 1    cloNIDine (CATAPRES) 0.3 MG tablet, TAKE 1 TABLET BY MOUTH TWICE  DAILY, Disp: 200 tablet, Rfl: 2   Continuous Blood Gluc Receiver (FREESTYLE LIBRE 2 READER) DEVI, Use to check blood glucose. Use as directed, Disp: 1 each, Rfl: 3   Continuous Glucose Sensor (FREESTYLE LIBRE 2 SENSOR) MISC, USE AS DIRECTED TO CHECK BLOOD  SUGAR DAILY, Disp: 8 each, Rfl: 2   dronedarone (MULTAQ) 400 MG tablet, Take 1 tablet (400 mg total) by mouth 2 (two) times daily., Disp: 180 tablet, Rfl: 2   ELIQUIS 5 MG TABS tablet, TAKE 1 TABLET BY MOUTH TWICE  DAILY, Disp: 200 tablet, Rfl: 2   Glucagon (GVOKE HYPOPEN 2-PACK) 0.5 MG/0.1ML SOAJ, Inject 0.5 mg into the skin daily as needed. For Hypoglycemic events, Disp: 0.2 mL, Rfl: 5   HYDROcodone-acetaminophen (NORCO) 5-325 MG tablet, Take 1 tablet by mouth every 6 (six) hours as needed for moderate pain or severe pain., Disp: 15 tablet, Rfl: 0   hydrocortisone 2.5 % cream, Apply topically 2 (two) times daily as needed (Rash)., Disp: 453.6 g, Rfl: 0   hydrOXYzine (ATARAX) 10 MG tablet, Take 1 tablet (10 mg total) by mouth every 6 (six) hours as needed., Disp: 30 tablet, Rfl: 1   hydrOXYzine (VISTARIL) 25 MG capsule, Take 1 capsule (25 mg total) by mouth every 8 (eight) hours as needed., Disp: 30 capsule, Rfl: 3   Insulin Lispro  Prot & Lispro (HUMALOG MIX 75/25 KWIKPEN) (75-25) 100 UNIT/ML Kwikpen, Inject 26 Units into the skin 2 (two) times daily before a meal., Disp: 60 mL, Rfl: 2   ipratropium (ATROVENT HFA) 17 MCG/ACT inhaler, Inhale 2 puffs into the lungs every 6 (six) hours as needed for wheezing., Disp: 1 each, Rfl: 2   ketoconazole (NIZORAL) 2 % cream, Apply 1 Application topically 2 (two) times daily., Disp: 60 g, Rfl: 1   ketorolac (ACULAR) 0.5 % ophthalmic solution, SMARTSIG:In Eye(s), Disp: , Rfl:    levothyroxine (SYNTHROID) 112 MCG tablet, Take 1 tablet (112 mcg total) by mouth daily., Disp: 90 tablet, Rfl: 3   lidocaine (LIDODERM) 5 %, Place 1 patch onto the skin  daily. Remove & Discard patch within 12 hours or as directed by MD, Disp: 30 patch, Rfl: 0   losartan (COZAAR) 100 MG tablet, TAKE 1 TABLET BY MOUTH DAILY, Disp: 70 tablet, Rfl: 4   metoprolol tartrate (LOPRESSOR) 25 MG tablet, Take 1 tablet (25 mg total) by mouth 2 (two) times daily., Disp: 180 tablet, Rfl: 3   nystatin powder, Apply 1 Application topically 2 (two) times daily., Disp: 60 g, Rfl: 7   nystatin-triamcinolone ointment (MYCOLOG), For up to 1 week as needed for rash them discontinue. May restart as needed for flares. Avoid applying to face, groin, and axilla. Use as directed. Long-term use can cause thinning of the skin., Disp: 30 g, Rfl: 1   ONETOUCH VERIO test strip, 1 each by Other route 3 (three) times daily., Disp: , Rfl:    predniSONE (DELTASONE) 10 MG tablet, 3 tabs by mouth per day for 3 days,2tabs per day for 3 days,1tab per day for 3 days, Disp: 18 tablet, Rfl: 0   RESTASIS MULTIDOSE 0.05 % ophthalmic emulsion, Place 1 drop into both eyes 2 (two) times daily., Disp: , Rfl:    rosuvastatin (CRESTOR) 10 MG tablet, TAKE 1 TABLET BY MOUTH IN THE  EVENING, Disp: 100 tablet, Rfl: 2   spironolactone (ALDACTONE) 25 MG tablet, TAKE 1 TABLET BY MOUTH DAILY, Disp: 100 tablet, Rfl: 2   tirzepatide (MOUNJARO) 5 MG/0.5ML Pen, Inject 5 mg into the skin once a week., Disp: 6 mL, Rfl: 3   TOBRADEX ophthalmic ointment, , Disp: , Rfl:    torsemide (DEMADEX) 20 MG tablet, Take 1 tablet (20 mg total) by mouth daily., Disp: 90 tablet, Rfl: 2   triamcinolone cream (KENALOG) 0.1 %, Apply 1 application topically 2 (two) times daily., Disp: 30 g, Rfl: 0   vitamin B-12 (CYANOCOBALAMIN) 1000 MCG tablet, Take 1,000 mcg by mouth daily., Disp: , Rfl:    Objective:     There were no vitals filed for this visit.    There is no height or weight on file to calculate BMI.    Physical Exam:    ***   Electronically signed by:  Aleen Sells D.[name] 7:16 AM 04/29/23

## 2023-04-30 ENCOUNTER — Ambulatory Visit: Payer: Medicare Other | Admitting: Sports Medicine

## 2023-04-30 VITALS — BP 140/78 | Ht 61.0 in | Wt 269.0 lb

## 2023-04-30 DIAGNOSIS — M7541 Impingement syndrome of right shoulder: Secondary | ICD-10-CM

## 2023-04-30 DIAGNOSIS — M19011 Primary osteoarthritis, right shoulder: Secondary | ICD-10-CM

## 2023-04-30 DIAGNOSIS — G8929 Other chronic pain: Secondary | ICD-10-CM | POA: Diagnosis not present

## 2023-04-30 DIAGNOSIS — M25511 Pain in right shoulder: Secondary | ICD-10-CM | POA: Diagnosis not present

## 2023-05-05 ENCOUNTER — Ambulatory Visit: Payer: Medicare Other | Admitting: Podiatry

## 2023-05-05 ENCOUNTER — Encounter: Payer: Self-pay | Admitting: Podiatry

## 2023-05-05 DIAGNOSIS — M79674 Pain in right toe(s): Secondary | ICD-10-CM | POA: Diagnosis not present

## 2023-05-05 DIAGNOSIS — Z794 Long term (current) use of insulin: Secondary | ICD-10-CM

## 2023-05-05 DIAGNOSIS — E1142 Type 2 diabetes mellitus with diabetic polyneuropathy: Secondary | ICD-10-CM

## 2023-05-05 DIAGNOSIS — D689 Coagulation defect, unspecified: Secondary | ICD-10-CM

## 2023-05-05 DIAGNOSIS — M79675 Pain in left toe(s): Secondary | ICD-10-CM

## 2023-05-05 DIAGNOSIS — B351 Tinea unguium: Secondary | ICD-10-CM

## 2023-05-10 NOTE — Progress Notes (Signed)
  Subjective:  Patient ID: [name], female    DOB: 05-Mar-1941,  MRN: [id number]  [name] presents to clinic today for: for annual diabetic foot examination  Chief Complaint  Patient presents with   Nail Problem    rfc    PCP is Sagardia, Eilleen Kempf, MD.  Allergies  Allergen Reactions   Doxycycline     Made patient feel very ill    Metformin Diarrhea    Review of Systems: Negative except as noted in the HPI.  Objective: No changes noted in today's physical examination. There were no vitals filed for this visit.  [name] is a pleasant 82 y.o. female in NAD. AAO x 3.  Vascular Examination: Capillary refill time <3 seconds b/l LE. Palpable pedal pulses b/l LE. Digital hair present b/l. No pedal edema b/l. Skin temperature gradient WNL b/l. No varicosities b/l. No cyanosis or clubbing noted b/l LE.Marland Kitchen  Dermatological Examination: Pedal skin with normal turgor, texture and tone b/l. No open wounds. No interdigital macerations b/l. Toenails 1-5 b/l thickened, discolored, dystrophic with subungual debris. There is pain on palpation to dorsal aspect of nailplates. .  Neurological Examination: Pt has subjective symptoms of neuropathy. Protective sensation intact 5/5 intact bilaterally with 10g monofilament b/l.  Musculoskeletal Examination: Normal muscle strength 5/5 to all lower extremity muscle groups bilaterally. HAV with bunion bilaterally and hammertoes 2-5 b/l. No pain, crepitus or joint limitation noted with ROM b/l LE.  Patient ambulates independently without assistive aids.     Latest Ref Rng & Units 12/12/2022   10:23 AM 08/13/2022   10:49 AM 05/13/2022    9:25 AM  Hemoglobin A1C  Hemoglobin-A1c 4.0 - 5.6 % 8.9  8.8  8.2    Assessment/Plan: 1. Pain due to onychomycosis of toenails of both feet   2. Coagulation defect (HCC)   3. Type 2 diabetes mellitus with diabetic polyneuropathy, with long-term current use of insulin (HCC)     -Patient was  evaluated and treated. All patient's and/or POA's questions/concerns answered on today's visit. -Diabetic foot examination performed today. -Continue diabetic foot care principles: inspect feet daily, monitor glucose as recommended by PCP and/or Endocrinologist, and follow prescribed diet per PCP, Endocrinologist and/or dietician. -Patient to continue soft, supportive shoe gear daily. -Mycotic toenails 1-5 bilaterally were debrided in length and girth with sterile nail nippers and dremel without incident. -Patient/POA to call should there be question/concern in the interim.   Return in about 3 months (around 08/05/2023).  [name], DPM

## 2023-05-20 DIAGNOSIS — E119 Type 2 diabetes mellitus without complications: Secondary | ICD-10-CM | POA: Diagnosis not present

## 2023-05-20 DIAGNOSIS — H25813 Combined forms of age-related cataract, bilateral: Secondary | ICD-10-CM | POA: Diagnosis not present

## 2023-05-22 DIAGNOSIS — N2581 Secondary hyperparathyroidism of renal origin: Secondary | ICD-10-CM | POA: Diagnosis not present

## 2023-05-22 DIAGNOSIS — N189 Chronic kidney disease, unspecified: Secondary | ICD-10-CM | POA: Diagnosis not present

## 2023-05-22 DIAGNOSIS — E875 Hyperkalemia: Secondary | ICD-10-CM | POA: Diagnosis not present

## 2023-05-22 DIAGNOSIS — E1122 Type 2 diabetes mellitus with diabetic chronic kidney disease: Secondary | ICD-10-CM | POA: Diagnosis not present

## 2023-05-22 DIAGNOSIS — I129 Hypertensive chronic kidney disease with stage 1 through stage 4 chronic kidney disease, or unspecified chronic kidney disease: Secondary | ICD-10-CM | POA: Diagnosis not present

## 2023-05-22 DIAGNOSIS — N179 Acute kidney failure, unspecified: Secondary | ICD-10-CM | POA: Diagnosis not present

## 2023-05-22 DIAGNOSIS — D631 Anemia in chronic kidney disease: Secondary | ICD-10-CM | POA: Diagnosis not present

## 2023-05-25 DIAGNOSIS — E875 Hyperkalemia: Secondary | ICD-10-CM | POA: Diagnosis not present

## 2023-05-27 ENCOUNTER — Ambulatory Visit (INDEPENDENT_AMBULATORY_CARE_PROVIDER_SITE_OTHER): Payer: Self-pay | Admitting: Emergency Medicine

## 2023-05-27 ENCOUNTER — Encounter: Payer: Self-pay | Admitting: Emergency Medicine

## 2023-05-27 VITALS — BP 128/86 | HR 77 | Temp 98.1°F | Ht 61.0 in | Wt 264.4 lb

## 2023-05-27 DIAGNOSIS — I480 Paroxysmal atrial fibrillation: Secondary | ICD-10-CM

## 2023-05-27 DIAGNOSIS — E875 Hyperkalemia: Secondary | ICD-10-CM | POA: Insufficient documentation

## 2023-05-27 DIAGNOSIS — I5042 Chronic combined systolic (congestive) and diastolic (congestive) heart failure: Secondary | ICD-10-CM

## 2023-05-27 DIAGNOSIS — E1159 Type 2 diabetes mellitus with other circulatory complications: Secondary | ICD-10-CM

## 2023-05-27 DIAGNOSIS — N1832 Chronic kidney disease, stage 3b: Secondary | ICD-10-CM

## 2023-05-27 DIAGNOSIS — R1010 Upper abdominal pain, unspecified: Secondary | ICD-10-CM

## 2023-05-27 DIAGNOSIS — I10 Essential (primary) hypertension: Secondary | ICD-10-CM

## 2023-05-27 DIAGNOSIS — I152 Hypertension secondary to endocrine disorders: Secondary | ICD-10-CM

## 2023-05-27 NOTE — Assessment & Plan Note (Signed)
Well-controlled hypertension Sees endocrinologist on a regular basis Continues insulin lispro 26 units twice a day and weekly Mounjaro 5 mg Medications managed by endocrinologist

## 2023-05-27 NOTE — Assessment & Plan Note (Signed)
Clinically stable.  Benign abdominal examination. Most likely related to recent use of Veltassa powder for hyperkalemia Needs to continue taking it as instructed No nausea or vomiting.  Able to eat and drink okay. States her appetite has been down but she is diabetic on [facility]

## 2023-05-27 NOTE — Patient Instructions (Signed)
Hyperkalemia Hyperkalemia is when you have too much of a mineral called potassium in your blood. If there is too much potassium in your blood, it can affect how your heart works. Potassium is normally removed from your body by your kidneys. What are the causes? Taking in too much potassium. This can happen if: You use salt substitutes. You take potassium supplements. You eat too many foods that are high in potassium if you have kidney disease. Your body not being able to get rid of potassium. This can happen if: Your kidneys are not working properly. You are taking certain medicines. You have a condition called Addison's disease. You have kidney stones. You are on treatment to clean your blood (dialysis) and you skip a treatment. Your cells releasing a high amount of potassium into the blood. This can happen if: You have a muscle injury. You have very bad burns or infections. You have problems with your blood plasma. This can be caused by diabetes that is not well controlled. What increases the risk? Drinking too much alcohol. Using drugs a lot. What are the signs or symptoms? In many cases, there are no symptoms. But, when your potassium level becomes high enough, you may have symptoms such as: An irregular heartbeat. A very slow heartbeat. Feeling like you may vomit (nauseous). Tiredness. Confusion. Tingling of your skin. Numbness of your hands or feet. Muscle cramps. Muscle weakness. Not being able to move (paralysis). How is this treated? Treatment depends on how bad your condition is. Treatment may need to be done in the hospital. It may include: Receiving a fluid with sugar (glucose) in it through an IV tube, along with insulin. Taking medicines. Having treatment to clean your blood. Taking calcium. Follow these instructions at home:  Take over-the-counter and prescription medicines only as told by your doctor. Do not take any of the following unless your doctor says it  is okay: Supplements. Natural food products. Herbs. Vitamins. If you drink alcohol, limit how much you have as told by your doctor. Do not use illegal drugs. If you need help quitting, ask your doctor. If you have kidney disease, you may need to follow a low-potassium diet. A food expert (dietitian) can help you. Keep all follow-up visits. Contact a doctor if: Your heartbeat is not regular or is very slow. You feel dizzy. You feel weak. You feel like you may vomit. You have tingling in your hands or feet. You lose feeling in your hands or feet. Get help right away if: You are short of breath. You have chest pain. You faint. You cannot move your muscles. These symptoms may be an emergency. Get help right away. Call 911. Do not wait to see if the symptoms will go away. Do not drive yourself to the hospital. Summary Hyperkalemia is when you have too much potassium in your blood. Take over-the-counter and prescription medicines only as told by your doctor. Limit how much alcohol you have as told by your doctor. Contact a doctor if your heartbeat is not regular. This information is not intended to replace advice given to you by your health care provider. Make sure you discuss any questions you have with your health care provider. Document Revised: 06/20/2021 Document Reviewed: 06/20/2021 Elsevier Patient Education  2024 [employer].

## 2023-05-27 NOTE — Assessment & Plan Note (Signed)
Recently seen by [employer]. Hyperkalemia on blood work Being treated with [employer]

## 2023-05-27 NOTE — Assessment & Plan Note (Signed)
Presently in sinus rhythm. Continue metoprolol tartrate 25 mg twice a day and Multaq 400 mg twice a day Continues Eliquis 5 mg twice a day No clinical bleeding episodes Fall precautions given

## 2023-05-27 NOTE — Assessment & Plan Note (Addendum)
Hyperkalemia precautions given Advised to stop Aldactone Most continue Veltassa as instructed by nephrologist Repeat blood work next Monday

## 2023-05-27 NOTE — Assessment & Plan Note (Addendum)
BP Readings from Last 3 Encounters:  05/27/23 128/86  04/30/23 (!) 140/78  04/09/23 134/74  Well-controlled hypertension Continue losartan 100 mg daily and metoprolol titrate 25 mg twice a day, clonidine 0.3 mg twice a day, and torsemide 20 mg daily Stop Aldactone due to hyperkalemia

## 2023-05-27 NOTE — Assessment & Plan Note (Signed)
Well-controlled.  No signs of acute congestive heart failure Blood pressure well-controlled Continues Demadex 20 mg daily.  Clinically euvolemic

## 2023-05-27 NOTE — Progress Notes (Signed)
[name] 82 y.o.   Chief Complaint  Patient presents with   GI Problem    Patient states she is having some stomach issues,     HISTORY OF PRESENT ILLNESS: This is a 82 y.o. 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  No nausea or vomiting.  No diarrhea.  No fever but occasional chills. Had blood work done last week and was advised to increase dose.  Scheduled for blood work again next Monday. Wt Readings from Last 3 Encounters:  04/30/23 269 lb (122 kg)  04/09/23 269 lb (122 kg)  02/19/23 269 lb 4 oz (122.1 kg)     GI Problem The primary symptoms include abdominal pain. Primary symptoms do not include fever, nausea, vomiting, diarrhea, melena, dysuria or rash.  The illness is also significant for chills.     Prior to Admission medications   Medication Sig Start Date End Date Taking? Authorizing Provider  Accu-Chek Softclix Lancets lancets 1 each by Other route 3 (three) times daily. as directed 03/30/20   Janeece Agee, NP  allopurinol (ZYLOPRIM) 300 MG tablet TAKE 1 TABLET BY MOUTH DAILY 11/13/22   Georgina Quint, MD  azelastine (ASTELIN) 0.1 % nasal spray 1-2 puffs each nostril at bedtime as needed 01/02/22   Jetty Duhamel D, MD  b complex vitamins capsule Take 1 capsule by mouth daily.    [provider]  BD PEN NEEDLE NANO 2ND GEN 32G X 4 MM MISC USE TWICE DAILY AS NEEDED 08/02/22   Georgina Quint, MD  blood glucose meter kit and supplies Dispense based on patient and insurance preference. Use up to four times daily as directed. (FOR ICD-10 E10.9, E11.9). 08/29/21   Georgina Quint, MD  cholecalciferol (VITAMIN D3) 25 MCG (1000 UNIT) tablet Take 1,000 Units by mouth daily.    [provider]  clobetasol cream (TEMOVATE) 0.05 % Apply 1 Application topically 2 (two) times daily. 06/05/22   Georgina Quint, MD  cloNIDine (CATAPRES) 0.3 MG tablet TAKE 1 TABLET BY MOUTH TWICE  DAILY 11/06/22   Georgina Quint, MD  Continuous Blood Gluc Receiver (FREESTYLE LIBRE 2 READER) DEVI Use to check blood glucose. Use as directed 01/08/23   Georgina Quint, MD  Continuous Glucose Sensor (FREESTYLE LIBRE 2 SENSOR) MISC USE AS DIRECTED TO CHECK BLOOD  SUGAR DAILY 02/11/23   Georgina Quint, MD  dronedarone (MULTAQ) 400 MG tablet Take 1 tablet (400 mg total) by mouth 2 (two) times daily. 01/19/23   Duke Salvia, MD  ELIQUIS 5 MG TABS tablet TAKE 1 TABLET BY MOUTH TWICE  DAILY 04/28/23   Duke Salvia, MD  Glucagon (GVOKE HYPOPEN 2-PACK) 0.5 MG/0.1ML SOAJ Inject 0.5 mg into the skin daily as needed. For Hypoglycemic events 05/30/22   Georgina Quint, MD  HYDROcodone-acetaminophen [facility]) 5-325 MG tablet Take 1 tablet by mouth every 6 (six) hours as needed for moderate pain or severe pain. 04/21/23   Georgina Quint, MD  hydrocortisone 2.5 % cream Apply topically 2 (two) times daily as needed (Rash). 04/13/23   Terri Piedra, DO  hydrOXYzine (ATARAX) 10 MG tablet Take 1 tablet (10 mg total) by mouth every 6 (six) hours as needed. 08/12/22   Georgina Quint, MD  hydrOXYzine (VISTARIL) 25 MG capsule Take 1 capsule (25 mg total) by mouth every 8 (eight) hours as needed. 01/15/23   Jonny Ruiz,  Len Blalock, MD  Insulin Lispro Prot & Lispro (HUMALOG MIX 75/25 KWIKPEN) (75-25) 100 UNIT/ML Kwikpen Inject 26 Units into the skin 2 (two) times daily before a meal. 12/12/22   [name], Konrad Dolores, MD  ipratropium (ATROVENT HFA) 17 MCG/ACT inhaler Inhale 2 puffs into the lungs every 6 (six) hours as needed for wheezing. 12/17/21 02/09/23  Waymon Budge, MD  ketoconazole (NIZORAL) 2 % cream Apply 1 Application topically 2 (two) times daily. 04/13/23 08/11/23  Terri Piedra, DO  ketorolac (ACULAR) 0.5 % ophthalmic solution SMARTSIG:In Eye(s) 08/22/21   [provider]  levothyroxine  (SYNTHROID) 112 MCG tablet Take 1 tablet (112 mcg total) by mouth daily. 04/09/23   Georgina Quint, MD  lidocaine (LIDODERM) 5 % Place 1 patch onto the skin daily. Remove & Discard patch within 12 hours or as directed by MD 08/29/20   Kathryne Hitch, MD  losartan (COZAAR) 100 MG tablet TAKE 1 TABLET BY MOUTH DAILY 10/13/22   Georgina Quint, MD  metoprolol tartrate (LOPRESSOR) 25 MG tablet Take 1 tablet (25 mg total) by mouth 2 (two) times daily. 10/29/22 10/24/23  Georgina Quint, MD  nystatin powder Apply 1 Application topically 2 (two) times daily. 08/12/22   Georgina Quint, MD  nystatin-triamcinolone ointment [facility]) For up to 1 week as needed for rash them discontinue. May restart as needed for flares. Avoid applying to face, groin, and axilla. Use as directed. Long-term use can cause thinning of the skin. 04/01/23   Terri Piedra, DO  [facility] VERIO test strip 1 each by Other route 3 (three) times daily. 06/08/20   [provider]  predniSONE (DELTASONE) 10 MG tablet 3 tabs by mouth per day for 3 days,2tabs per day for 3 days,1tab per day for 3 days 01/15/23   Corwin Levins, MD  RESTASIS MULTIDOSE 0.05 % ophthalmic emulsion Place 1 drop into both eyes 2 (two) times daily. 03/04/20   [provider]  rosuvastatin (CRESTOR) 10 MG tablet TAKE 1 TABLET BY MOUTH IN THE  EVENING 02/11/23   Georgina Quint, MD  spironolactone (ALDACTONE) 25 MG tablet TAKE 1 TABLET BY MOUTH DAILY 02/11/23   [name], Konrad Dolores, MD  tirzepatide [facility]) 5 MG/0.5ML Pen Inject 5 mg into the skin once a week. 12/12/22   [name], Konrad Dolores, MD  TOBRADEX ophthalmic ointment  08/25/21   [provider]  torsemide (DEMADEX) 20 MG tablet Take 1 tablet (20 mg total) by mouth daily. 01/19/23   Duke Salvia, MD  triamcinolone cream (KENALOG) 0.1 % Apply 1 application topically 2 (two) times daily. 07/26/20   Georgina Quint, MD  vitamin B-12  (CYANOCOBALAMIN) 1000 MCG tablet Take 1,000 mcg by mouth daily.    [provider]  potassium chloride (KLOR-CON) 10 MEQ tablet Take 4 tablets (40 mEq total) by mouth daily. 12/10/21 03/06/22  Graciella Freer, PA-C    Allergies  Allergen Reactions   Doxycycline     Made patient feel very ill    Metformin Diarrhea    Patient Active Problem List   Diagnosis Date Noted   Infected abrasion 03/09/2023   Abrasion of right knee 03/09/2023   Allergic rhinitis 01/15/2023   Stage 3b chronic kidney disease (HCC) 01/07/2023   Right hip pain 08/13/2022   Contusion of right knee 07/09/2022   Primary hyperaldosteronism (HCC) 03/06/2022   Coronary artery disease involving native coronary artery of native heart without angina pectoris 02/12/2022   Adrenal adenoma,  right 02/07/2022   Skin lesion of right arm 02/02/2022   Asthma 11/01/2021   Normocytic anemia 10/11/2021   Nodule of right lung 09/30/2021   Persistent cough 09/27/2021   Dysthymia 03/12/2021   Primary osteoarthritis involving multiple joints 03/01/2021   Recurrent falls 01/10/2021   Sensorineural hearing loss (SNHL), 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   Persistent atrial fibrillation (HCC) 08/12/2020   Type 2 diabetes mellitus with hyperglycemia, with long-term current use of insulin (HCC) 06/21/2020   Dyslipidemia associated with type 2 diabetes mellitus (HCC) 06/21/2020   Mixed hyperlipidemia 06/21/2020   Uncontrolled hypertension 06/11/2020   Aortic atherosclerosis (HCC) 03/28/2020   Diabetic peripheral neuropathy (HCC) 12/26/2019   Gastroesophageal reflux disease without esophagitis 03/17/2019   Rash and nonspecific skin eruption 03/17/2019   Pruritus 02/16/2019   Varicose veins of both legs with edema 02/16/2019   Heart murmur 09/05/2018   Restrictive lung disease 02/22/2018   Dysgeusia 06/11/2017   Osteoarthritis of left knee 06/03/2017   NAFLD (nonalcoholic fatty liver disease) 56/38/7564   Osteoporosis 12/31/2015    Obesity 12/19/2015   Acquired hypothyroidism 10/18/2015   Prolonged QT interval 10/03/2013   CHF (congestive heart failure) (HCC) 04/21/2013   Long term (current) use of anticoagulants 06/09/2012   Musculoskeletal back pain    Acute lumbar myofascial strain 08/19/2011   BENIGN NEOPLASM OF ADRENAL GLAND 11/25/2010   Chronic diastolic heart failure (HCC) 02/20/2010   Diabetes (HCC) 05/16/2009   GOUT 05/16/2009   OBESITY, MORBID 05/16/2009   Hypertension associated with diabetes (HCC) 05/16/2009   Paroxysmal atrial fibrillation (HCC) 05/16/2009   HYPERLIPIDEMIA 11/30/2008   Obstructive sleep apnea on CPAP 11/30/2008    Past Medical History:  Diagnosis Date   Arthritis    Back pain    BCC (basal cell carcinoma) 04/01/2023   right lower chin, referral for Mohs   Chronic anticoagulation    due to aflutter   Chronic kidney disease    Diabetes mellitus    Diastolic CHF, chronic (HCC)    a.  echo 2006 - ef 55-65%; mild diast dysfxn;    b. Echo 08/2011: Mild LVH, EF 60%;  c. 04/2013 Echo: EF 65-69%, mild conc LVH;  08/2014 Echo: EF 60-65%, mild-mod MR.   Gout    Hyperlipidemia    Hypertension    a.  Renal arterial Dopplers 12/2011: 1-59% right renal artery stenosis   Morbid obesity (HCC)    Obstructive sleep apnea on CPAP    Paroxysmal Afib/Flutter    a. 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    Past Surgical History:  Procedure Laterality Date   APPENDECTOMY     ATRIAL FLUTTER ABLATION N/A 09/24/2011   Procedure: ATRIAL FLUTTER ABLATION;  Surgeon: Marinus Maw, MD;  Location: [facility] CATH LAB;  Service: Cardiovascular;  Laterality: N/A;   CARDIOVERSION  10/22/2011   Procedure: CARDIOVERSION;  Surgeon: Duke Salvia, MD;  Location: [facility] OR;  Service: Cardiovascular;  Laterality: N/A;   CARDIOVERSION N/A 09/10/2011   Procedure: CARDIOVERSION;  Surgeon: Duke Salvia, MD;  Location: [facility] CATH LAB;  Service: Cardiovascular;  Laterality: N/A;   CHOLECYSTECTOMY     COLONOSCOPY WITH PROPOFOL  N/A 06/13/2021   Procedure: COLONOSCOPY WITH PROPOFOL;  Surgeon: Rachael Fee, MD;  Location: [facility] ENDOSCOPY;  Service: Endoscopy;  Laterality: N/A;   POLYPECTOMY  06/13/2021   Procedure: POLYPECTOMY;  Surgeon: Rachael Fee, MD;  Location: [facility] ENDOSCOPY;  Service: Endoscopy;;   TONSILLECTOMY  1982   TOTAL ABDOMINAL HYSTERECTOMY      Social  History   Socioeconomic History   Marital status: Married    Spouse name: Not on file   Number of children: 3   Years of education: Not on file   Highest education level: Not on file  Occupational History   Occupation: DISABILITY/housewife    Employer: RETIRED  Tobacco Use   Smoking status: Never   Smokeless tobacco: Never  Vaping Use   Vaping status: Never Used  Substance and Sexual Activity   Alcohol use: No   Drug use: No   Sexual activity: Yes  Other Topics Concern   Not on file  Social History Narrative   Not on file   Social Determinants of Health   Financial Resource Strain: Low Risk  (11/21/2022)   Overall Financial Resource Strain (CARDIA)    Difficulty of Paying Living Expenses: Not hard at all  Food Insecurity: No Food Insecurity (11/21/2022)   Hunger Vital Sign    Worried About Running Out of Food in the Last Year: Never true    Ran Out of Food in the Last Year: Never true  Transportation Needs: No Transportation Needs (11/21/2022)   PRAPARE - Administrator, Civil Service (Medical): No    Lack of Transportation (Non-Medical): No  Physical Activity: Inactive (11/21/2022)   Exercise Vital Sign    Days of Exercise per Week: 0 days    Minutes of Exercise per Session: 0 min  Stress: No Stress Concern Present (11/21/2022)   [employer] of Occupational Health - Occupational Stress Questionnaire    Feeling of Stress : Not at all  Social Connections: Socially Integrated (11/21/2022)   Social Connection and Isolation Panel [NHANES]    Frequency of Communication with Friends and Family: More than three times a week     Frequency of Social Gatherings with Friends and Family: Never    Attends Religious Services: More than 4 times per year    Active Member of [employer] or Organizations: Yes    Attends Engineer, structural: More than 4 times per year    Marital Status: Married  Catering manager Violence: Not At Risk (11/21/2022)   Humiliation, Afraid, Rape, and Kick questionnaire    Fear of Current or Ex-Partner: No    Emotionally Abused: No    Physically Abused: No    Sexually Abused: No    Family History  Problem Relation Age of Onset   Heart disease Father    Hypertension Father    Breast cancer Sister    Cancer Sister        breast   Colon cancer Neg Hx    Esophageal cancer Neg Hx    Pancreatic cancer Neg Hx    Liver disease Neg Hx      Review of Systems  Constitutional:  Positive for chills. Negative for fever.  HENT: Negative.  Negative for congestion and sore throat.   Respiratory: Negative.  Negative for cough and shortness of breath.   Cardiovascular:  Negative for chest pain and palpitations.  Gastrointestinal:  Positive for abdominal pain. Negative for blood in stool, diarrhea, melena, nausea and vomiting.  Genitourinary: Negative.  Negative for dysuria and hematuria.  Skin: Negative.  Negative for rash.  Neurological:  Negative for dizziness and headaches.  All other systems reviewed and are negative.   Today's Vitals   05/27/23 1327  BP: 128/86  Pulse: 77  Temp: 98.1 F (36.7 C)  TempSrc: Oral  SpO2: 92%  Weight: 264 lb 6 oz (119.9 kg)  Height: 5\' 1"  (1.549 m)   Body mass index is 49.95 kg/m.   Physical Exam Vitals reviewed.  Constitutional:      Appearance: Normal appearance. She is obese.  HENT:     Head: Normocephalic.  Eyes:     Extraocular Movements: Extraocular movements intact.     Pupils: Pupils are equal, round, and reactive to light.  Cardiovascular:     Rate and Rhythm: Normal rate and regular rhythm.     Pulses: Normal pulses.     Heart  sounds: Normal heart sounds.  Pulmonary:     Effort: Pulmonary effort is normal.     Breath sounds: Normal breath sounds.  Abdominal:     Palpations: Abdomen is soft.     Tenderness: There is no abdominal tenderness.  Musculoskeletal:     Cervical back: No tenderness.  Lymphadenopathy:     Cervical: No cervical adenopathy.  Skin:    General: Skin is warm and dry.     Capillary Refill: Capillary refill takes less than 2 seconds.  Neurological:     General: No focal deficit present.     Mental Status: She is alert and oriented to person, place, and time.  Psychiatric:        Mood and Affect: Mood normal.        Behavior: Behavior normal.      ASSESSMENT & PLAN: A total of 47 minutes was spent with the patient and counseling/coordination of care regarding preparing for this visit, review of most recent office visit notes, review of most recent blood work results, review of multiple chronic medical conditions under management, review of all medications and changes made, treatment of hyperkalemia, prognosis, ED precautions, documentation and need for follow-up.  Problem List Items Addressed This Visit       Cardiovascular and Mediastinum   Hypertension associated with diabetes (HCC)    Well-controlled hypertension Sees endocrinologist on a regular basis Continues insulin lispro 26 units twice a day and weekly Mounjaro 5 mg Medications managed by endocrinologist      Paroxysmal atrial fibrillation (HCC)    Presently in sinus rhythm. Continue metoprolol tartrate 25 mg twice a day and Multaq 400 mg twice a day Continues Eliquis 5 mg twice a day No clinical bleeding episodes Fall precautions given      CHF (congestive heart failure) (HCC)    Well-controlled.  No signs of acute congestive heart failure Blood pressure well-controlled Continues Demadex 20 mg daily.  Clinically euvolemic      Uncontrolled hypertension    BP Readings from Last 3 Encounters:  05/27/23 128/86   04/30/23 (!) 140/78  04/09/23 134/74  Well-controlled hypertension Continue losartan 100 mg daily and metoprolol titrate 25 mg twice a day, clonidine 0.3 mg twice a day, and torsemide 20 mg daily Stop Aldactone due to hyperkalemia          Genitourinary   Stage 3b chronic kidney disease (HCC)    Recently seen by [facility]. Hyperkalemia on blood work Being treated with Veltassa      Hyperkalemia, diminished renal excretion    Hyperkalemia precautions given Advised to stop Aldactone Most continue Veltassa as instructed by nephrologist Repeat blood work next Monday        Other   Upper abdominal pain - Primary    Clinically stable.  Benign abdominal examination. Most likely related to recent use of Veltassa powder for hyperkalemia Needs to continue taking it as instructed No nausea or vomiting.  Able to  eat and drink okay. States her appetite has been down but she is diabetic on [facility]      Patient Instructions  Hyperkalemia Hyperkalemia is when you have too much of a mineral called potassium in your blood. If there is too much potassium in your blood, it can affect how your heart works. Potassium is normally removed from your body by your kidneys. What are the causes? Taking in too much potassium. This can happen if: You use salt substitutes. You take potassium supplements. You eat too many foods that are high in potassium if you have kidney disease. Your body not being able to get rid of potassium. This can happen if: Your kidneys are not working properly. You are taking certain medicines. You have a condition called Addison's disease. You have kidney stones. You are on treatment to clean your blood (dialysis) and you skip a treatment. Your cells releasing a high amount of potassium into the blood. This can happen if: You have a muscle injury. You have very bad burns or infections. You have problems with your blood plasma. This can be caused by  diabetes that is not well controlled. What increases the risk? Drinking too much alcohol. Using drugs a lot. What are the signs or symptoms? In many cases, there are no symptoms. But, when your potassium level becomes high enough, you may have symptoms such as: An irregular heartbeat. A very slow heartbeat. Feeling like you may vomit (nauseous). Tiredness. Confusion. Tingling of your skin. Numbness of your hands or feet. Muscle cramps. Muscle weakness. Not being able to move (paralysis). How is this treated? Treatment depends on how bad your condition is. Treatment may need to be done in the hospital. It may include: Receiving a fluid with sugar (glucose) in it through an IV tube, along with insulin. Taking medicines. Having treatment to clean your blood. Taking calcium. Follow these instructions at home:  Take over-the-counter and prescription medicines only as told by your doctor. Do not take any of the following unless your doctor says it is okay: Supplements. Natural food products. Herbs. Vitamins. If you drink alcohol, limit how much you have as told by your doctor. Do not use illegal drugs. If you need help quitting, ask your doctor. If you have kidney disease, you may need to follow a low-potassium diet. A food expert (dietitian) can help you. Keep all follow-up visits. Contact a doctor if: Your heartbeat is not regular or is very slow. You feel dizzy. You feel weak. You feel like you may vomit. You have tingling in your hands or feet. You lose feeling in your hands or feet. Get help right away if: You are short of breath. You have chest pain. You faint. You cannot move your muscles. These symptoms may be an emergency. Get help right away. Call 911. Do not wait to see if the symptoms will go away. Do not drive yourself to the hospital. Summary Hyperkalemia is when you have too much potassium in your blood. Take over-the-counter and prescription medicines only  as told by your doctor. Limit how much alcohol you have as told by your doctor. Contact a doctor if your heartbeat is not regular. This information is not intended to replace advice given to you by your health care provider. Make sure you discuss any questions you have with your health care provider. Document Revised: 06/20/2021 Document Reviewed: 06/20/2021 Elsevier Patient Education  2024 Elsevier Inc.     Edwina Barth, MD [facility]

## 2023-06-01 DIAGNOSIS — E875 Hyperkalemia: Secondary | ICD-10-CM | POA: Diagnosis not present

## 2023-06-03 NOTE — Progress Notes (Deleted)
Name: [name]  Age/ Sex: 82 y.o., female   MRN/ DOB: [id number], 05-16-1941     PCP: Georgina Quint, MD   Reason for Endocrinology Evaluation: Type 2 Diabetes Mellitus  Initial Endocrine Consultative Visit: 06/21/2020    PATIENT IDENTIFIER: [name] is a 82 y.o. female with a past medical history of HTN, PAF, CHF, OSA and Dyslipidemia . The patient has followed with Endocrinology clinic since 06/21/2020 for consultative assistance with management of her diabetes.  DIABETIC HISTORY:  Ms. Dietsch was diagnosed with DM in 2011,she is intolerant to metformin due to diarrhea. Her hemoglobin A1c has ranged from 7.3% in 2011, peaking at 9.5% in 2014.   ADRENAL HISTORY: She was noted with an incidental finding of right adrenal adenoma ~ 4 cm on CT imaging and 09/2010 during evaluation of abdominal pain.  This has been stable on CT imaging of abdomen 03/2020  She had normal saliva cortisol 05/2021  She had normal normetanephrine and metanephrines 01/2022   Patient has primary hyperaldosteronism with an elevated Aldo/PRA ratio of 63.6, low renin activity at 0.11 (in the setting of ARB) and a normal level aldosterone of 7.  Would not proceed with invasive testing due to advanced age and cardiovascular history We have opted to treat medically with spironolactone which was started in May 2023   Switched Ozempic to [facility] 11/2022    SUBJECTIVE:   During the last visit (12/12/2022): A1c 8.9 %    Today (06/03/2023): [name] is here for follow-up on diabetes management.  She is accompanied by her friend.  She checks her blood sugars multiple times a day  times daily, through CGM . The patient has  not had hypoglycemic episodes since the last clinic visit, she self reduced   She continues to follow-up with cardiology for A-fib She was recently seen by her PCP for abdominal pain that has been attributed to Veltassa powder for hyperkalemia secondary to CKD  She was  seen by podiatry 05/05/2023  HOME ENDOCRINE REGIMEN:  Humalog Mix 30 units with Breakfast and 30 units Supper Mounjaro 5 mg weekly Spironolactone 25 mg daily       Statin: yes ACE-I/ARB: yes   CONTINUOUS GLUCOSE MONITORING RECORD INTERPRETATION    Dates of Recording: 2/10-2/23/2024  Sensor description:freestyle libre  Results statistics:   CGM use % of time 96  Average and SD 199/19  Time in range 34%  % Time Above 180 55  % Time above 250 11  % Time Below target 0     Glycemic patterns summary: BG's high during the night but increase as the day progresses Hyperglycemic episodes during the day and night but worse during the day  Hypoglycemic episodes occurred N/A  Overnight periods: high           DIABETIC COMPLICATIONS: Microvascular complications:  Neuropathy Denies: CKD, , retinopathy Last eye exam: Completed 2022   Macrovascular complications:    Denies: CAD, PVD, CVA    HISTORY:  Past Medical History:  Past Medical History:  Diagnosis Date   Arthritis    Back pain    BCC (basal cell carcinoma) 04/01/2023   right lower chin, referral for Mohs   Chronic anticoagulation    due to aflutter   Chronic kidney disease    Diabetes mellitus    Diastolic CHF, chronic (HCC)    a.  echo 2006 - ef 55-65%; mild diast dysfxn;    b. Echo 08/2011: Mild LVH, EF 60%;  c. 04/2013 Echo:  EF 65-69%, mild conc LVH;  08/2014 Echo: EF 60-65%, mild-mod MR.   Gout    Hyperlipidemia    Hypertension    a.  Renal arterial Dopplers 12/2011: 1-59% right renal artery stenosis   Morbid obesity (HCC)    Obstructive sleep apnea on CPAP    Paroxysmal Afib/Flutter    a. 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   Past Surgical History:  Past Surgical History:  Procedure Laterality Date   APPENDECTOMY     ATRIAL FLUTTER ABLATION N/A 09/24/2011   Procedure: ATRIAL FLUTTER ABLATION;  Surgeon: Marinus Maw, MD;  Location: [facility] CATH LAB;  Service: Cardiovascular;  Laterality:  N/A;   CARDIOVERSION  10/22/2011   Procedure: CARDIOVERSION;  Surgeon: Duke Salvia, MD;  Location: [facility] OR;  Service: Cardiovascular;  Laterality: N/A;   CARDIOVERSION N/A 09/10/2011   Procedure: CARDIOVERSION;  Surgeon: Duke Salvia, MD;  Location: [facility] CATH LAB;  Service: Cardiovascular;  Laterality: N/A;   CHOLECYSTECTOMY     COLONOSCOPY WITH PROPOFOL N/A 06/13/2021   Procedure: COLONOSCOPY WITH PROPOFOL;  Surgeon: Rachael Fee, MD;  Location: [facility] ENDOSCOPY;  Service: Endoscopy;  Laterality: N/A;   POLYPECTOMY  06/13/2021   Procedure: POLYPECTOMY;  Surgeon: Rachael Fee, MD;  Location: [facility] ENDOSCOPY;  Service: Endoscopy;;   TONSILLECTOMY  1982   TOTAL ABDOMINAL HYSTERECTOMY     Social History:  reports that she has never smoked. She has never used smokeless tobacco. She reports that she does not drink alcohol and does not use drugs. Family History:  Family History  Problem Relation Age of Onset   Heart disease Father    Hypertension Father    Breast cancer Sister    Cancer Sister        breast   Colon cancer Neg Hx    Esophageal cancer Neg Hx    Pancreatic cancer Neg Hx    Liver disease Neg Hx      HOME MEDICATIONS: Allergies as of 06/04/2023       Reactions   Doxycycline    Made patient feel very ill    Metformin Diarrhea        Medication List        Accurate as of June 03, 2023  3:19 PM. If you have any questions, ask your nurse or doctor.          Accu-Chek Softclix Lancets lancets 1 each by Other route 3 (three) times daily. as directed   allopurinol 300 MG tablet Commonly known as: ZYLOPRIM TAKE 1 TABLET BY MOUTH DAILY   azelastine 0.1 % nasal spray Commonly known as: ASTELIN 1-2 puffs each nostril at bedtime as needed   b complex vitamins capsule Take 1 capsule by mouth daily.   BD Pen Needle Nano 2nd Gen 32G X 4 MM Misc Generic drug: Insulin Pen Needle USE TWICE DAILY AS NEEDED   blood glucose meter kit and supplies Dispense based on  patient and insurance preference. Use up to four times daily as directed. (FOR ICD-10 E10.9, E11.9).   cholecalciferol 25 MCG (1000 UNIT) tablet Commonly known as: VITAMIN D3 Take 1,000 Units by mouth daily.   clobetasol cream 0.05 % Commonly known as: TEMOVATE Apply 1 Application topically 2 (two) times daily.   cloNIDine 0.3 MG tablet Commonly known as: CATAPRES TAKE 1 TABLET BY MOUTH TWICE  DAILY   cyanocobalamin 1000 MCG tablet Commonly known as: VITAMIN B12 Take 1,000 mcg by mouth daily.   dronedarone 400 MG tablet Commonly known  as: MULTAQ Take 1 tablet (400 mg total) by mouth 2 (two) times daily.   Eliquis 5 MG Tabs tablet Generic drug: apixaban TAKE 1 TABLET BY MOUTH TWICE  DAILY   FreeStyle Libre 2 Reader Devi Use to check blood glucose. Use as directed   FreeStyle Libre 2 Sensor Misc USE AS DIRECTED TO CHECK BLOOD  SUGAR DAILY   Gvoke HypoPen 2-Pack 0.5 MG/0.1ML Soaj Generic drug: Glucagon Inject 0.5 mg into the skin daily as needed. For Hypoglycemic events   HYDROcodone-acetaminophen 5-325 MG tablet Commonly known as: Norco Take 1 tablet by mouth every 6 (six) hours as needed for moderate pain or severe pain.   hydrocortisone 2.5 % cream Apply topically 2 (two) times daily as needed (Rash).   hydrOXYzine 10 MG tablet Commonly known as: ATARAX Take 1 tablet (10 mg total) by mouth every 6 (six) hours as needed.   hydrOXYzine 25 MG capsule Commonly known as: VISTARIL Take 1 capsule (25 mg total) by mouth every 8 (eight) hours as needed.   Insulin Lispro Prot & Lispro (75-25) 100 UNIT/ML Kwikpen Commonly known as: HumaLOG Mix 75/25 KwikPen Inject 26 Units into the skin 2 (two) times daily before a meal.   ipratropium 17 MCG/ACT inhaler Commonly known as: ATROVENT HFA Inhale 2 puffs into the lungs every 6 (six) hours as needed for wheezing.   ketoconazole 2 % cream Commonly known as: NIZORAL Apply 1 Application topically 2 (two) times daily.    ketorolac 0.5 % ophthalmic solution Commonly known as: ACULAR SMARTSIG:In Eye(s)   levothyroxine 112 MCG tablet Commonly known as: SYNTHROID Take 1 tablet (112 mcg total) by mouth daily.   lidocaine 5 % Commonly known as: Lidoderm Place 1 patch onto the skin daily. Remove & Discard patch within 12 hours or as directed by MD   losartan 100 MG tablet Commonly known as: COZAAR TAKE 1 TABLET BY MOUTH DAILY   metoprolol tartrate 25 MG tablet Commonly known as: LOPRESSOR Take 1 tablet (25 mg total) by mouth 2 (two) times daily.   nystatin powder Commonly known as: nystatin Apply 1 Application topically 2 (two) times daily.   nystatin-triamcinolone ointment Commonly known as: MYCOLOG For up to 1 week as needed for rash them discontinue. May restart as needed for flares. Avoid applying to face, groin, and axilla. Use as directed. Long-term use can cause thinning of the skin.   OneTouch Verio test strip Generic drug: glucose blood 1 each by Other route 3 (three) times daily.   predniSONE 10 MG tablet Commonly known as: DELTASONE 3 tabs by mouth per day for 3 days,2tabs per day for 3 days,1tab per day for 3 days   Restasis MultiDose 0.05 % ophthalmic emulsion Generic drug: cycloSPORINE Place 1 drop into both eyes 2 (two) times daily.   rosuvastatin 10 MG tablet Commonly known as: CRESTOR TAKE 1 TABLET BY MOUTH IN THE  EVENING   tirzepatide 5 MG/0.5ML Pen Commonly known as: MOUNJARO Inject 5 mg into the skin once a week.   TobraDex ophthalmic ointment Generic drug: tobramycin-dexamethasone   torsemide 20 MG tablet Commonly known as: DEMADEX Take 1 tablet (20 mg total) by mouth daily.   triamcinolone cream 0.1 % Commonly known as: KENALOG Apply 1 application topically 2 (two) times daily.   Veltassa 8.4 g packet Generic drug: patiromer Take 1 packet by mouth daily.         OBJECTIVE:   Vital Signs: There were no vitals taken for this visit.   Wt  Readings  from Last 3 Encounters:  05/27/23 264 lb 6 oz (119.9 kg)  04/30/23 269 lb (122 kg)  04/09/23 269 lb (122 kg)     Exam: General: Pt appears well and is in NAD, in a wheelchair  Lungs: Clear with good BS bilat   Heart: RRR  Extremities: Trace  pretibial edema.   Neuro: MS is good with appropriate affect, pt is alert and Ox3    DM foot exam: 04/2023 per podiatry       DATA REVIEWED:  Lab Results  Component Value Date   HGBA1C 8.9 (A) 12/12/2022   HGBA1C 8.8 (A) 08/13/2022   HGBA1C 8.2 (A) 05/13/2022    Latest Reference Range & Units 12/30/22 08:57 02/22/23 11:01  Sodium 135 - 145 mEq/L 132 (L)   Potassium 3.5 - 5.1 mEq/L 5.6 (H)   Chloride 96 - 112 mEq/L 99   CO2 19 - 32 mEq/L 23   Glucose 70 - 99 mg/dL 818 (H)   BUN 6 - 23 mg/dL 52 (H)   Creatinine 2.99 - 1.20 mg/dL 3.71 (H)   Calcium 8.4 - 10.5 mg/dL 9.5   eGFR   69.6 (E)  Alkaline Phosphatase 39 - 117 U/L 104   Albumin 3.5 - 5.2 g/dL 3.9   AST 0 - 37 U/L 15   ALT 0 - 35 U/L 14   Total Protein 6.0 - 8.3 g/dL 7.6   Total Bilirubin 0.2 - 1.2 mg/dL 0.6   GFR >78.93 mL/min 30.78 (L)   (L): Data is abnormally low (H): Data is abnormally high (E): External lab result  ASSESSMENT / PLAN / RECOMMENDATIONS:   1) Type 2 Diabetes Mellitus, Poorly  controlled, With Neuropathic  complications - Most recent A1c of 8.9%. Goal A1c < 7.5 %.    -Patient with worsening glycemic control  - Will switch Ozempic to mounjaro, provided with # 4 pen samples  - Intolerant to Metformin  -I will increase her insulin as below -She was referred to our CDE for further education regarding low-carb diet  MEDICATIONS:  -Increase Humalog Mix 26 units with Breakfast and 26 units with Supper  -Switch Ozempic to Mounjaro 5 mg weekly   EDUCATION / INSTRUCTIONS: BG monitoring instructions: Patient is instructed to check her blood sugars 3 times a day, before meals . Call [facility] if: BG persistently < 70 I reviewed the  Rule of 15 for the treatment of hypoglycemia in detail with the patient. Literature supplied.    2) Diabetic complications:  Eye: Does not have known diabetic retinopathy.  Neuro/ Feet: Does have known diabetic peripheral neuropathy .  Renal: Patient does not have known baseline CKD. She   is on an ACEI/ARB at present.   3) Right Adrenal Adenoma:   -She was diagnosed with right adrenal adenoma~4 cm on CT imaging from 2011, this has remained stable based on CT scan 01/01/2022 at 4.3 cm  -She had normal saliva cortisol testing 05/2021, metanephrines and normetanephrine's -She did have suppressed renin in the setting of aldosterone >5 at 7 and elevated Aldo: Renin ratio     4) Primary Hyperaldosteronism:   -We have opted not to proceed with adrenal venous sampling no surgical intervention given advanced age and CHF and other cardiovascular history -Renin has been normal on current dose of spironolactone -BP controlled  Medication Continue spironolactone 25 mg daily     F/U in 6 months    Signed electronically by: Lyndle Herrlich, MD  [facility] Endocrinology  [facility] [address], [address] [place], [place] [address] Phone: [phone] FAX: [phone]   CC: Georgina Quint, MD [address] [place] [address] Phone: [phone]  Fax: [phone]  Return to Endocrinology clinic as below: Future Appointments  Date Time Provider Department Center  06/04/2023 11:30 AM Aracelia Brinson, Konrad Dolores, MD [facility] None  08/12/2023  2:00 PM Freddie Breech, DPM [facility]  11/23/2023 10:00 AM [facility]NU[facility] VISIT [facility] None

## 2023-06-04 ENCOUNTER — Ambulatory Visit: Payer: Medicare Other | Admitting: Internal Medicine

## 2023-06-05 ENCOUNTER — Ambulatory Visit: Payer: Self-pay | Admitting: Medical

## 2023-07-06 NOTE — Progress Notes (Unsigned)
Name: [name]  Age/ Sex: 82 y.o., female   MRN/ DOB: [id number], 1941/06/02     PCP: Georgina Quint, MD   Reason for Endocrinology Evaluation: Type 2 Diabetes Mellitus  Initial Endocrine Consultative Visit: 06/21/2020    PATIENT IDENTIFIER: [name] is a 82 y.o. female with a past medical history of HTN, PAF, CHF, OSA and Dyslipidemia . The patient has followed with Endocrinology clinic since 06/21/2020 for consultative assistance with management of her diabetes.  DIABETIC HISTORY:  [name] was diagnosed with DM in 2011,she is intolerant to metformin due to diarrhea. Her hemoglobin A1c has ranged from 7.3% in 2011, peaking at 9.5% in 2014.   ADRENAL HISTORY: She was noted with an incidental finding of right adrenal adenoma ~ 4 cm on CT imaging and 09/2010 during evaluation of abdominal pain.  This has been stable on CT imaging of abdomen 03/2020  She had normal saliva cortisol 05/2021  She had normal normetanephrine and metanephrines 01/2022   Patient has primary hyperaldosteronism with an elevated Aldo/PRA ratio of 63.6, low renin activity at 0.11 (in the setting of ARB) and a normal level aldosterone of 7.  Would not proceed with invasive testing due to advanced age and cardiovascular history We have opted to treat medically with spironolactone which was started in May 2023  SUBJECTIVE:   During the last visit (12/12/2022): A1c 8.9 %    Today (07/07/2023): [name] is here for follow-up on diabetes management.  She is accompanied by her friend.  She checks her blood sugars multiple times a day  times daily, through CGM . The patient has  not had hypoglycemic episodes since the last clinic visit, she self reduced    She was seen by her PCP in August 2024 due to abdominal pain that was attributed to [facility] powder for the treatment of hyperkalemia secondary to CKD   She was seen by podiatry 05/05/2023  She follows with sports medicine for chronic  shoulder pains  She follows with cardiology for A-fib, history of cardioversion  She denies any constipation, or diarrhea Denies nausea or vomiting  HOME ENDOCRINE REGIMEN:  Humalog Mix 26 units with Breakfast and 26 units Supper- she has been taking 30 units once a day  Mounjaro 5 mg weekly  Spironolactone 25 mg daily - not taking       Statin: yes ACE-I/ARB: yes   CONTINUOUS GLUCOSE MONITORING RECORD INTERPRETATION    Dates of Recording: 2/10-2/23/2024  Sensor description:freestyle libre  Results statistics:   CGM use % of time 96  Average and SD 199/19  Time in range 34%  % Time Above 180 55  % Time above 250 11  % Time Below target 0     Glycemic patterns summary: BG's high during the night but increase as the day progresses Hyperglycemic episodes during the day and night but worse during the day  Hypoglycemic episodes occurred N/A  Overnight periods: high           DIABETIC COMPLICATIONS: Microvascular complications:  Neuropathy Denies: CKD, , retinopathy Last eye exam: Completed 03/30/2023   Macrovascular complications:    Denies: CAD, PVD, CVA    HISTORY:  Past Medical History:  Past Medical History:  Diagnosis Date   Arthritis    Back pain    BCC (basal cell carcinoma) 04/01/2023   right lower chin, Mohs completed on 04/21/23   Chronic anticoagulation    due to aflutter   Chronic kidney disease  Diabetes mellitus    Diastolic CHF, chronic (HCC)    a.  echo 2006 - ef 55-65%; mild diast dysfxn;    b. Echo 08/2011: Mild LVH, EF 60%;  c. 04/2013 Echo: EF 65-69%, mild conc LVH;  08/2014 Echo: EF 60-65%, mild-mod MR.   Gout    Hyperlipidemia    Hypertension    a.  Renal arterial Dopplers 12/2011: 1-59% right renal artery stenosis   Morbid obesity (HCC)    Obstructive sleep apnea on CPAP    Paroxysmal Afib/Flutter    a. 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   Past Surgical History:  Past Surgical History:  Procedure Laterality  Date   APPENDECTOMY     ATRIAL FLUTTER ABLATION N/A 09/24/2011   Procedure: ATRIAL FLUTTER ABLATION;  Surgeon: Marinus Maw, MD;  Location: [facility] CATH LAB;  Service: Cardiovascular;  Laterality: N/A;   CARDIOVERSION  10/22/2011   Procedure: CARDIOVERSION;  Surgeon: Duke Salvia, MD;  Location: [facility] OR;  Service: Cardiovascular;  Laterality: N/A;   CARDIOVERSION N/A 09/10/2011   Procedure: CARDIOVERSION;  Surgeon: Duke Salvia, MD;  Location: [facility] CATH LAB;  Service: Cardiovascular;  Laterality: N/A;   CHOLECYSTECTOMY     COLONOSCOPY WITH PROPOFOL N/A 06/13/2021   Procedure: COLONOSCOPY WITH PROPOFOL;  Surgeon: Rachael Fee, MD;  Location: [facility] ENDOSCOPY;  Service: Endoscopy;  Laterality: N/A;   POLYPECTOMY  06/13/2021   Procedure: POLYPECTOMY;  Surgeon: Rachael Fee, MD;  Location: [facility] ENDOSCOPY;  Service: Endoscopy;;   TONSILLECTOMY  1982   TOTAL ABDOMINAL HYSTERECTOMY     Social History:  reports that she has never smoked. She has never used smokeless tobacco. She reports that she does not drink alcohol and does not use drugs. Family History:  Family History  Problem Relation Age of Onset   Heart disease Father    Hypertension Father    Breast cancer Sister    Cancer Sister        breast   Colon cancer Neg Hx    Esophageal cancer Neg Hx    Pancreatic cancer Neg Hx    Liver disease Neg Hx      HOME MEDICATIONS: Allergies as of 07/07/2023       Reactions   Doxycycline    Made patient feel very ill    Metformin Diarrhea        Medication List        Accurate as of July 07, 2023 11:18 AM. If you have any questions, ask your nurse or doctor.          Accu-Chek Softclix Lancets lancets 1 each by Other route 3 (three) times daily. as directed   allopurinol 300 MG tablet Commonly known as: ZYLOPRIM TAKE 1 TABLET BY MOUTH DAILY   amLODipine 5 MG tablet Commonly known as: NORVASC   azelastine 0.1 % nasal spray Commonly known as: ASTELIN 1-2 puffs each  nostril at bedtime as needed   b complex vitamins capsule Take 1 capsule by mouth daily.   BD Pen Needle Nano 2nd Gen 32G X 4 MM Misc Generic drug: Insulin Pen Needle USE TWICE DAILY AS NEEDED   blood glucose meter kit and supplies Dispense based on patient and insurance preference. Use up to four times daily as directed. (FOR ICD-10 E10.9, E11.9).   cholecalciferol 25 MCG (1000 UNIT) tablet Commonly known as: VITAMIN D3 Take 1,000 Units by mouth daily.   clobetasol cream 0.05 % Commonly known as: TEMOVATE Apply 1 Application topically 2 (two)  times daily.   cloNIDine 0.3 MG tablet Commonly known as: CATAPRES TAKE 1 TABLET BY MOUTH TWICE  DAILY   cyanocobalamin 1000 MCG tablet Commonly known as: VITAMIN B12 Take 1,000 mcg by mouth daily.   dronedarone 400 MG tablet Commonly known as: MULTAQ Take 1 tablet (400 mg total) by mouth 2 (two) times daily.   Eliquis 5 MG Tabs tablet Generic drug: apixaban TAKE 1 TABLET BY MOUTH TWICE  DAILY   FreeStyle Libre 2 Reader Devi Use to check blood glucose. Use as directed   FreeStyle Libre 2 Sensor Misc USE AS DIRECTED TO CHECK BLOOD  SUGAR DAILY   Gvoke HypoPen 2-Pack 0.5 MG/0.1ML Soaj Generic drug: Glucagon Inject 0.5 mg into the skin daily as needed. For Hypoglycemic events   HYDROcodone-acetaminophen 5-325 MG tablet Commonly known as: Norco Take 1 tablet by mouth every 6 (six) hours as needed for moderate pain or severe pain.   hydrocortisone 2.5 % cream Apply topically 2 (two) times daily as needed (Rash).   hydrOXYzine 10 MG tablet Commonly known as: ATARAX Take 1 tablet (10 mg total) by mouth every 6 (six) hours as needed.   hydrOXYzine 25 MG capsule Commonly known as: VISTARIL Take 1 capsule (25 mg total) by mouth every 8 (eight) hours as needed.   Insulin Lispro Prot & Lispro (75-25) 100 UNIT/ML Kwikpen Commonly known as: HumaLOG Mix 75/25 KwikPen Inject 26 Units into the skin 2 (two) times daily before a  meal. What changed: how much to take   ipratropium 17 MCG/ACT inhaler Commonly known as: ATROVENT HFA Inhale 2 puffs into the lungs every 6 (six) hours as needed for wheezing.   ketoconazole 2 % cream Commonly known as: NIZORAL Apply 1 Application topically 2 (two) times daily.   ketorolac 0.5 % ophthalmic solution Commonly known as: ACULAR SMARTSIG:In Eye(s)   levothyroxine 112 MCG tablet Commonly known as: SYNTHROID Take 1 tablet (112 mcg total) by mouth daily.   lidocaine 5 % Commonly known as: Lidoderm Place 1 patch onto the skin daily. Remove & Discard patch within 12 hours or as directed by MD   losartan 100 MG tablet Commonly known as: COZAAR TAKE 1 TABLET BY MOUTH DAILY   metoprolol tartrate 25 MG tablet Commonly known as: LOPRESSOR Take 1 tablet (25 mg total) by mouth 2 (two) times daily.   nystatin powder Commonly known as: nystatin Apply 1 Application topically 2 (two) times daily.   nystatin-triamcinolone ointment Commonly known as: MYCOLOG For up to 1 week as needed for rash them discontinue. May restart as needed for flares. Avoid applying to face, groin, and axilla. Use as directed. Long-term use can cause thinning of the skin.   OneTouch Verio test strip Generic drug: glucose blood 1 each by Other route 3 (three) times daily.   predniSONE 10 MG tablet Commonly known as: DELTASONE 3 tabs by mouth per day for 3 days,2tabs per day for 3 days,1tab per day for 3 days   Restasis MultiDose 0.05 % ophthalmic emulsion Generic drug: cycloSPORINE Place 1 drop into both eyes 2 (two) times daily.   rosuvastatin 10 MG tablet Commonly known as: CRESTOR TAKE 1 TABLET BY MOUTH IN THE  EVENING   tirzepatide 5 MG/0.5ML Pen Commonly known as: MOUNJARO Inject 5 mg into the skin once a week.   TobraDex ophthalmic ointment Generic drug: tobramycin-dexamethasone   torsemide 20 MG tablet Commonly known as: DEMADEX Take 1 tablet (20 mg total) by mouth daily.    triamcinolone cream 0.1 %  Commonly known as: KENALOG Apply 1 application topically 2 (two) times daily.   Veltassa 8.4 g packet Generic drug: patiromer Take 1 packet by mouth daily.         OBJECTIVE:   Vital Signs: BP 126/72 (BP Location: Right Arm, Patient Position: Sitting, Cuff Size: Large)   Pulse 81   Ht 5\' 1"  (1.549 m)   Wt 260 lb (117.9 kg)   SpO2 94%   BMI 49.13 kg/m    Wt Readings from Last 3 Encounters:  07/07/23 260 lb (117.9 kg)  05/27/23 264 lb 6 oz (119.9 kg)  04/30/23 269 lb (122 kg)     Exam: General: Pt appears well and is in NAD, in a wheelchair  Lungs: Clear with good BS bilat   Heart: RRR  Extremities: Trace  pretibial edema.   Neuro: MS is good with appropriate affect, pt is alert and Ox3    DM foot exam: 05/05/2023 per podiatry      DATA REVIEWED:  Lab Results  Component Value Date   HGBA1C 8.9 (A) 12/12/2022   HGBA1C 8.8 (A) 08/13/2022   HGBA1C 8.2 (A) 05/13/2022    Latest Reference Range & Units 05/29/22 11:19  COMPREHENSIVE METABOLIC PANEL  Rpt !  Sodium 135 - 145 mEq/L 133 (L)  Potassium 3.5 - 5.1 mEq/L 4.5  Chloride 96 - 112 mEq/L 95 (L)  CO2 19 - 32 mEq/L 31  Glucose 70 - 99 mg/dL 782 (H)  BUN 6 - 23 mg/dL 41 (H)  Creatinine 9.56 - 1.20 mg/dL 2.13  Calcium 8.4 - 08.6 mg/dL 9.1  Alkaline Phosphatase 39 - 117 U/L 110  Albumin 3.5 - 5.2 g/dL 3.8  AST 0 - 37 U/L 15  ALT 0 - 35 U/L 18  Total Protein 6.0 - 8.3 g/dL 7.5  Total Bilirubin 0.2 - 1.2 mg/dL 0.5  GFR >57.84 mL/min 45.12 (L)   In office 324 mg/dL     ASSESSMENT / PLAN / RECOMMENDATIONS:   1) Type 2 Diabetes Mellitus, Poorly  controlled, With Neuropathic  complications - Most recent A1c of 8.9%. Goal A1c < 7.5 %.    -Patient with worsening glycemic control -Unfortunately she has self adjusted her insulin by taking insulin makes once a day instead of twice a day?,  I did discuss half-life of insulin mix and the importance of taking this twice daily, if she is  not comfortable with the dosing she may reduce the dose but she should not just take it once a day as this will cause glycemic excursions. - Intolerant to Metformin  -I will increase Mounjaro as below -I will adjust her Humalog Mix dosing as below, we again emphasized the importance of taking this before breakfast and supper    MEDICATIONS:  -Take Humalog Mix 20 units with Breakfast and 20 units with Supper  -Increase  Mounjaro 7.5 mg weekly   EDUCATION / INSTRUCTIONS: BG monitoring instructions: Patient is instructed to check her blood sugars 3 times a day, before meals . Call [facility] if: BG persistently < 70 I reviewed the Rule of 15 for the treatment of hypoglycemia in detail with the patient. Literature supplied.    2) Diabetic complications:  Eye: Does not have known diabetic retinopathy.  Neuro/ Feet: Does have known diabetic peripheral neuropathy .  Renal: Patient does not have known baseline CKD. She   is on an ACEI/ARB at present.   3) Right Adrenal Adenoma:   -She was diagnosed with right adrenal adenoma~4  cm on CT imaging from 2011, this has remained stable based on CT scan 01/01/2022 at 4.3 cm  -She had normal saliva cortisol testing 05/2021, metanephrines and normetanephrine's -She did have suppressed renin in the setting of aldosterone >5 at 7 and elevated Aldo: Renin ratio     4) Primary Hyperaldosteronism:   -We have opted NOT to proceed with adrenal venous sampling and no surgical intervention given advanced age and CHF and other cardiovascular history -Renin has been normal on spironolactone 25 mg in the past, spironolactone had to be discontinued by PCP due to hypokalemia -In the setting of CHF cardiovascular disease I would prefer for the patient to remain on spironolactone and decreasing valsartan or even discontinuing this if needed in the future as opposed to discontinuing the spironolactone -BP controlled  Medication Restart  spironolactone 25 mg daily Decrease Losartan to 50 mg daily  Recheck BMP in a month     F/U in 6 months    Signed electronically by: Lyndle Herrlich, MD  [facility]  [facility] [address] [place], [place] [address] Phone: [phone] FAX: [phone]   CC: Georgina Quint, MD [address] [place] [address] Phone: [phone]  Fax: [phone]  Return to Endocrinology clinic as below: Future Appointments  Date Time Provider Department Center  08/12/2023  2:00 PM Freddie Breech, DPM [facility]  11/23/2023 10:00 AM [facility]ANNUAL WELLNESS VISIT [facility] None

## 2023-07-07 ENCOUNTER — Ambulatory Visit (INDEPENDENT_AMBULATORY_CARE_PROVIDER_SITE_OTHER): Payer: PPO | Admitting: Internal Medicine

## 2023-07-07 ENCOUNTER — Encounter: Payer: Self-pay | Admitting: Internal Medicine

## 2023-07-07 VITALS — BP 126/72 | HR 81 | Ht 61.0 in | Wt 260.0 lb

## 2023-07-07 DIAGNOSIS — E1165 Type 2 diabetes mellitus with hyperglycemia: Secondary | ICD-10-CM

## 2023-07-07 DIAGNOSIS — Z794 Long term (current) use of insulin: Secondary | ICD-10-CM | POA: Diagnosis not present

## 2023-07-07 DIAGNOSIS — E2609 Other primary hyperaldosteronism: Secondary | ICD-10-CM

## 2023-07-07 LAB — BASIC METABOLIC PANEL
BUN: 31 mg/dL — ABNORMAL HIGH (ref 6–23)
CO2: 25 meq/L (ref 19–32)
Calcium: 8.6 mg/dL (ref 8.4–10.5)
Chloride: 101 meq/L (ref 96–112)
Creatinine, Ser: 1.85 mg/dL — ABNORMAL HIGH (ref 0.40–1.20)
GFR: 25.57 mL/min — ABNORMAL LOW (ref >60.00)
Glucose, Bld: 291 mg/dL — ABNORMAL HIGH (ref 70–99)
Potassium: 4.8 meq/L (ref 3.5–5.1)
Sodium: 135 meq/L (ref 135–145)

## 2023-07-07 LAB — POCT GLYCOSYLATED HEMOGLOBIN (HGB A1C): Hemoglobin A1C: 9.1 % — AB (ref 4.0–5.6)

## 2023-07-07 LAB — POCT GLUCOSE (DEVICE FOR HOME USE): POC Glucose: 324 mg/dL — AB (ref 70–99)

## 2023-07-07 MED ORDER — TIRZEPATIDE 7.5 MG/0.5ML ~~LOC~~ SOAJ: 7.5000 mg | SUBCUTANEOUS | 3 refills | Status: DC

## 2023-07-07 MED ORDER — LOSARTAN POTASSIUM 50 MG PO TABS: 50.0000 mg | ORAL_TABLET | Freq: Every day | ORAL | 3 refills | Status: DC

## 2023-07-07 MED ORDER — INSULIN LISPRO PROT & LISPRO (75-25 MIX) 100 UNIT/ML KWIKPEN: 20.0000 [IU] | PEN_INJECTOR | Freq: Two times a day (BID) | SUBCUTANEOUS | 2 refills | Status: DC

## 2023-07-07 NOTE — Patient Instructions (Addendum)
-   Take   Humalog Mix 20 units with Breakfast and 20 units with Supper  - Increase Mounjaro 7.5 mg once weekly  - Restart Spironolactone but decrease Cozaar to 50 mg daily to help with high potassium     HOW TO TREAT LOW BLOOD SUGARS (Blood sugar LESS THAN 70 MG/DL) Please follow the RULE OF 15 for the treatment of hypoglycemia treatment (when your (blood sugars are less than 70 mg/dL)   STEP 1: Take 15 grams of carbohydrates when your blood sugar is low, which includes:  3-4 GLUCOSE TABS  OR 3-4 OZ OF JUICE OR REGULAR SODA OR ONE TUBE OF GLUCOSE GEL    STEP 2: RECHECK blood sugar in 15 MINUTES STEP 3: If your blood sugar is still low at the 15 minute recheck --> then, go back to STEP 1 and treat AGAIN with another 15 grams of carbohydrates.

## 2023-07-08 ENCOUNTER — Telehealth: Payer: Self-pay | Admitting: Internal Medicine

## 2023-07-08 MED ORDER — SPIRONOLACTONE 25 MG PO TABS: 25.0000 mg | ORAL_TABLET | Freq: Every day | ORAL | 3 refills | Status: DC

## 2023-07-08 NOTE — Telephone Encounter (Signed)
Please let the patient know that her potassium is normal, she needs to restart spironolactone 25 mg daily, I had already decreased her losartan from 100 mg to 50 mg   She should already have an appointment next month to have her potassium rechecked at our office    Thanks

## 2023-07-09 NOTE — Telephone Encounter (Signed)
Patient aware and verbalized understanding.

## 2023-07-21 ENCOUNTER — Telehealth: Payer: Self-pay | Admitting: Internal Medicine

## 2023-07-21 DIAGNOSIS — E875 Hyperkalemia: Secondary | ICD-10-CM | POA: Diagnosis not present

## 2023-07-21 DIAGNOSIS — I480 Paroxysmal atrial fibrillation: Secondary | ICD-10-CM

## 2023-07-21 DIAGNOSIS — N2581 Secondary hyperparathyroidism of renal origin: Secondary | ICD-10-CM | POA: Diagnosis not present

## 2023-07-21 DIAGNOSIS — I5032 Chronic diastolic (congestive) heart failure: Secondary | ICD-10-CM

## 2023-07-21 NOTE — Telephone Encounter (Signed)
Pt c/o swelling: STAT is pt has developed SOB within 24 hours  How much weight have you gained and in what time span? She has not weighted herself to see if she has gained weight. The swelling started last Saturday.  If swelling, where is the swelling located? Feet  Are you currently taking a fluid pill? Yes, Torsemide 20MG    Are you currently SOB? No  Do you have a log of your daily weights (if so, list)? No  Have you gained 3 pounds in a day or 5 pounds in a week? Unsure  Have you traveled recently? No

## 2023-07-22 ENCOUNTER — Ambulatory Visit (INDEPENDENT_AMBULATORY_CARE_PROVIDER_SITE_OTHER): Payer: PPO | Admitting: Emergency Medicine

## 2023-07-22 ENCOUNTER — Encounter: Payer: Self-pay | Admitting: Emergency Medicine

## 2023-07-22 VITALS — BP 124/64 | HR 74 | Temp 97.9°F | Ht 61.0 in | Wt 266.1 lb

## 2023-07-22 DIAGNOSIS — I2510 Atherosclerotic heart disease of native coronary artery without angina pectoris: Secondary | ICD-10-CM | POA: Diagnosis not present

## 2023-07-22 DIAGNOSIS — I700 Atherosclerosis of aorta: Secondary | ICD-10-CM | POA: Diagnosis not present

## 2023-07-22 DIAGNOSIS — I152 Hypertension secondary to endocrine disorders: Secondary | ICD-10-CM | POA: Diagnosis not present

## 2023-07-22 DIAGNOSIS — E2609 Other primary hyperaldosteronism: Secondary | ICD-10-CM

## 2023-07-22 DIAGNOSIS — E1169 Type 2 diabetes mellitus with other specified complication: Secondary | ICD-10-CM

## 2023-07-22 DIAGNOSIS — E039 Hypothyroidism, unspecified: Secondary | ICD-10-CM

## 2023-07-22 DIAGNOSIS — E1159 Type 2 diabetes mellitus with other circulatory complications: Secondary | ICD-10-CM | POA: Diagnosis not present

## 2023-07-22 DIAGNOSIS — R600 Localized edema: Secondary | ICD-10-CM | POA: Diagnosis not present

## 2023-07-22 DIAGNOSIS — I480 Paroxysmal atrial fibrillation: Secondary | ICD-10-CM | POA: Diagnosis not present

## 2023-07-22 DIAGNOSIS — I5042 Chronic combined systolic (congestive) and diastolic (congestive) heart failure: Secondary | ICD-10-CM

## 2023-07-22 DIAGNOSIS — E785 Hyperlipidemia, unspecified: Secondary | ICD-10-CM | POA: Diagnosis not present

## 2023-07-22 DIAGNOSIS — N1832 Chronic kidney disease, stage 3b: Secondary | ICD-10-CM | POA: Diagnosis not present

## 2023-07-22 NOTE — Assessment & Plan Note (Signed)
Chronic stable conditions On insulin pump Continue rosuvastatin 10 mg daily

## 2023-07-22 NOTE — Assessment & Plan Note (Signed)
Clinically stable Continues levothyroxine 112 mcg daily

## 2023-07-22 NOTE — Assessment & Plan Note (Signed)
Much improved today. Continues Demadex 20 mg daily Leg elevation advised Low-salt diet advised Consider compression socks

## 2023-07-22 NOTE — Patient Instructions (Signed)
Health Maintenance After Age 80 After age 80, you are at a higher risk for certain long-term diseases and infections as well as injuries from falls. Falls are a major cause of broken bones and head injuries in people who are older than age 80. Getting regular preventive care can help to keep you healthy and well. Preventive care includes getting regular testing and making lifestyle changes as recommended by your health care provider. Talk with your health care provider about: Which screenings and tests you should have. A screening is a test that checks for a disease when you have no symptoms. A diet and exercise plan that is right for you. What should I know about screenings and tests to prevent falls? Screening and testing are the best ways to find a health problem early. Early diagnosis and treatment give you the best chance of managing medical conditions that are common after age 80. Certain conditions and lifestyle choices may make you more likely to have a fall. Your health care provider may recommend: Regular vision checks. Poor vision and conditions such as cataracts can make you more likely to have a fall. If you wear glasses, make sure to get your prescription updated if your vision changes. Medicine review. Work with your health care provider to regularly review all of the medicines you are taking, including over-the-counter medicines. Ask your health care provider about any side effects that may make you more likely to have a fall. Tell your health care provider if any medicines that you take make you feel dizzy or sleepy. Strength and balance checks. Your health care provider may recommend certain tests to check your strength and balance while standing, walking, or changing positions. Foot health exam. Foot pain and numbness, as well as not wearing proper footwear, can make you more likely to have a fall. Screenings, including: Osteoporosis screening. Osteoporosis is a condition that causes  the bones to get weaker and break more easily. Blood pressure screening. Blood pressure changes and medicines to control blood pressure can make you feel dizzy. Depression screening. You may be more likely to have a fall if you have a fear of falling, feel depressed, or feel unable to do activities that you used to do. Alcohol use screening. Using too much alcohol can affect your balance and may make you more likely to have a fall. Follow these instructions at home: Lifestyle Do not drink alcohol if: Your health care provider tells you not to drink. If you drink alcohol: Limit how much you have to: 0-1 drink a day for women. 0-2 drinks a day for men. Know how much alcohol is in your drink. In the [place] one drink equals one 12 oz bottle of beer (355 mL), one 5 oz glass of wine (148 mL), or one 1 oz glass of hard liquor (44 mL). Do not use any products that contain nicotine or tobacco. These products include cigarettes, chewing tobacco, and vaping devices, such as e-cigarettes. If you need help quitting, ask your health care provider. Activity  Follow a regular exercise program to stay fit. This will help you maintain your balance. Ask your health care provider what types of exercise are appropriate for you. If you need a cane or walker, use it as recommended by your health care provider. Wear supportive shoes that have nonskid soles. Safety  Remove any tripping hazards, such as rugs, cords, and clutter. Install safety equipment such as grab bars in bathrooms and safety rails on stairs. Keep rooms and walkways   well-lit. General instructions Talk with your health care provider about your risks for falling. Tell your health care provider if: You fall. Be sure to tell your health care provider about all falls, even ones that seem minor. You feel dizzy, tiredness (fatigue), or off-balance. Take over-the-counter and prescription medicines only as told by your health care provider. These include  supplements. Eat a healthy diet and maintain a healthy weight. A healthy diet includes low-fat dairy products, low-fat (lean) meats, and fiber from whole grains, beans, and lots of fruits and vegetables. Stay current with your vaccines. Schedule regular health, dental, and eye exams. Summary Having a healthy lifestyle and getting preventive care can help to protect your health and wellness after age 80. Screening and testing are the best way to find a health problem early and help you avoid having a fall. Early diagnosis and treatment give you the best chance for managing medical conditions that are more common for people who are older than age 80. Falls are a major cause of broken bones and head injuries in people who are older than age 80. Take precautions to prevent a fall at home. Work with your health care provider to learn what changes you can make to improve your health and wellness and to prevent falls. This information is not intended to replace advice given to you by your health care provider. Make sure you discuss any questions you have with your health care provider. Document Revised: 02/25/2021 Document Reviewed: 02/25/2021 Elsevier Patient Education  2024 [employer]vier Inc.  

## 2023-07-22 NOTE — Assessment & Plan Note (Signed)
No signs of acute CHF. Peripheral edema much improved. Continues Demadex 20 mg daily Hypertension well-controlled Continues losartan 50 mg and metoprolol tartrate 25 mg twice a day

## 2023-07-22 NOTE — Assessment & Plan Note (Signed)
Chronic stable condition Diet and nutrition discussed Continue rosuvastatin 10 mg daily

## 2023-07-22 NOTE — Assessment & Plan Note (Signed)
Continues to be monitored by nephrology service Advised to stay well-hydrated and avoid NSAIDs

## 2023-07-22 NOTE — Assessment & Plan Note (Signed)
Continues Aldactone 25 mg daily as per endocrinologist instructions

## 2023-07-22 NOTE — Telephone Encounter (Signed)
Spoke with pt who complains of increasing swelling of her feet and ankles x 5 days.  Pt reports she elevates her feet and legs during the day in her recliner and during the night in her hospital bed.  She states swelling does go down at night.  BP and HR has been WNL.  She states she is on a low sodium diet and hydrates with mostly water.  She is taking medications as prescribed.  She saw her PCP today who advised she call her cardiologist regarding swelling. Pt advised will forward to Dr Graciela Husbands for review and further recommendation.  Pt verbalizes understanding and agrees with current plan.

## 2023-07-22 NOTE — Assessment & Plan Note (Signed)
Sinus rhythm with good ventricular response today Continues anticoagulation with Eliquis 5 mg twice a day No significant clinical bleeding episodes. Continues Dronedarone 400 mg twice a day Continues metoprolol tartrate 25 mg twice a day

## 2023-07-22 NOTE — Progress Notes (Signed)
[name] 82 y.o.   Chief Complaint  Patient presents with   Edema    Patient states she has some swelling in her feet x 1 week Swelling right side of her abd,    HISTORY OF PRESENT ILLNESS: This is a 82 y.o. female complaining of swelling to both feet for 1 week which is now much improved Also complaining of intermittent swelling on right side of abdomen for 2 weeks No other complaints or medical concerns today. Overall doing well.  HPI   Prior to Admission medications   Medication Sig Start Date End Date Taking? Authorizing Provider  Accu-Chek Softclix Lancets lancets 1 each by Other route 3 (three) times daily. as directed 03/30/20  Yes Janeece Agee, NP  allopurinol (ZYLOPRIM) 300 MG tablet TAKE 1 TABLET BY MOUTH DAILY 11/13/22  Yes Georgina Quint, MD  amLODipine (NORVASC) 5 MG tablet  06/03/23  Yes [provider]  azelastine (ASTELIN) 0.1 % nasal spray 1-2 puffs each nostril at bedtime as needed 01/02/22  Yes Young, Joni Fears D, MD  b complex vitamins capsule Take 1 capsule by mouth daily.   Yes [provider]  BD PEN NEEDLE NANO 2ND GEN 32G X 4 MM MISC USE TWICE DAILY AS NEEDED 08/02/22  Yes Jaynee Winters, Eilleen Kempf, MD  blood glucose meter kit and supplies Dispense based on patient and insurance preference. Use up to four times daily as directed. (FOR ICD-10 E10.9, E11.9). 08/29/21  Yes Taraya Steward, Eilleen Kempf, MD  cholecalciferol (VITAMIN D3) 25 MCG (1000 UNIT) tablet Take 1,000 Units by mouth daily.   Yes [provider]  clobetasol cream (TEMOVATE) 0.05 % Apply 1 Application topically 2 (two) times daily. 06/05/22  Yes Lailanie Hasley, Eilleen Kempf, MD  cloNIDine (CATAPRES) 0.3 MG tablet TAKE 1 TABLET BY MOUTH TWICE  DAILY 11/06/22  Yes Hansford Hirt, Eilleen Kempf, MD  Continuous Blood Gluc Receiver (FREESTYLE LIBRE 2 READER) DEVI Use to check blood glucose. Use as directed 01/08/23  Yes Taiwan Talcott, Eilleen Kempf, MD  Continuous Glucose Sensor (FREESTYLE LIBRE 2  SENSOR) MISC USE AS DIRECTED TO CHECK BLOOD  SUGAR DAILY 02/11/23  Yes Georgina Quint, MD  dronedarone (MULTAQ) 400 MG tablet Take 1 tablet (400 mg total) by mouth 2 (two) times daily. 01/19/23  Yes Duke Salvia, MD  ELIQUIS 5 MG TABS tablet TAKE 1 TABLET BY MOUTH TWICE  DAILY 04/28/23  Yes Duke Salvia, MD  Glucagon (GVOKE HYPOPEN 2-PACK) 0.5 MG/0.1ML SOAJ Inject 0.5 mg into the skin daily as needed. For Hypoglycemic events 05/30/22  Yes Ronnett Pullin, Eilleen Kempf, MD  HYDROcodone-acetaminophen [facility]) 5-325 MG tablet Take 1 tablet by mouth every 6 (six) hours as needed for moderate pain or severe pain. 04/21/23  Yes Anisha Starliper, Eilleen Kempf, MD  hydrocortisone 2.5 % cream Apply topically 2 (two) times daily as needed (Rash). 04/13/23  Yes Terri Piedra, DO  hydrOXYzine (ATARAX) 10 MG tablet Take 1 tablet (10 mg total) by mouth every 6 (six) hours as needed. 08/12/22  Yes Keyan Folson, Eilleen Kempf, MD  hydrOXYzine (VISTARIL) 25 MG capsule Take 1 capsule (25 mg total) by mouth every 8 (eight) hours as needed. 01/15/23  Yes Corwin Levins, MD  Insulin Lispro Prot & Lispro (HUMALOG MIX 75/25 KWIKPEN) (75-25) 100 UNIT/ML Kwikpen Inject 20 Units into the skin 2 (two) times daily before a meal. 07/07/23  Yes [name], Konrad Dolores, MD  ketoconazole (NIZORAL) 2 % cream Apply 1 Application topically 2 (two) times daily. 04/13/23 08/11/23 Yes Langston Reusing  N, DO  ketorolac (ACULAR) 0.5 % ophthalmic solution SMARTSIG:In Eye(s) 08/22/21  Yes [provider]  levothyroxine (SYNTHROID) 112 MCG tablet Take 1 tablet (112 mcg total) by mouth daily. 04/09/23  Yes Lorren Rossetti, Eilleen Kempf, MD  lidocaine (LIDODERM) 5 % Place 1 patch onto the skin daily. Remove & Discard patch within 12 hours or as directed by MD 08/29/20  Yes Kathryne Hitch, MD  losartan (COZAAR) 50 MG tablet Take 1 tablet (50 mg total) by mouth daily. 07/07/23  Yes [name], Konrad Dolores, MD  metoprolol tartrate (LOPRESSOR) 25 MG tablet  Take 1 tablet (25 mg total) by mouth 2 (two) times daily. 10/29/22 10/24/23 Yes Inza Mikrut, Eilleen Kempf, MD  nystatin powder Apply 1 Application topically 2 (two) times daily. 08/12/22  Yes Jaquise Faux, Eilleen Kempf, MD  nystatin-triamcinolone ointment [facility]) For up to 1 week as needed for rash them discontinue. May restart as needed for flares. Avoid applying to face, groin, and axilla. Use as directed. Long-term use can cause thinning of the skin. 04/01/23  Yes Terri Piedra, DO  [facility] VERIO test strip 1 each by Other route 3 (three) times daily. 06/08/20  Yes [provider]  predniSONE (DELTASONE) 10 MG tablet 3 tabs by mouth per day for 3 days,2tabs per day for 3 days,1tab per day for 3 days 01/15/23  Yes Corwin Levins, MD  RESTASIS MULTIDOSE 0.05 % ophthalmic emulsion Place 1 drop into both eyes 2 (two) times daily. 03/04/20  Yes [provider]  rosuvastatin (CRESTOR) 10 MG tablet TAKE 1 TABLET BY MOUTH IN THE  EVENING 02/11/23  Yes Myrikal Messmer, Eilleen Kempf, MD  spironolactone (ALDACTONE) 25 MG tablet Take 1 tablet (25 mg total) by mouth daily. 07/08/23  Yes [name], Konrad Dolores, MD  tirzepatide [facility]) 7.5 MG/0.5ML Pen Inject 7.5 mg into the skin once a week. 07/07/23  Yes [name], Konrad Dolores, MD  TOBRADEX ophthalmic ointment  08/25/21  Yes [provider]  torsemide (DEMADEX) 20 MG tablet Take 1 tablet (20 mg total) by mouth daily. 01/19/23  Yes Duke Salvia, MD  triamcinolone cream (KENALOG) 0.1 % Apply 1 application topically 2 (two) times daily. 07/26/20  Yes Jihad Brownlow, Eilleen Kempf, MD  VELTASSA 8.4 g packet Take 1 packet by mouth daily. 04/17/23  Yes [provider]  vitamin B-12 (CYANOCOBALAMIN) 1000 MCG tablet Take 1,000 mcg by mouth daily.   Yes [provider]  ipratropium (ATROVENT HFA) 17 MCG/ACT inhaler Inhale 2 puffs into the lungs every 6 (six) hours as needed for wheezing. 12/17/21 02/09/23  Waymon Budge, MD  potassium  chloride (KLOR-CON) 10 MEQ tablet Take 4 tablets (40 mEq total) by mouth daily. 12/10/21 03/06/22  Graciella Freer, PA-C    Allergies  Allergen Reactions   Doxycycline     Made patient feel very ill    Metformin Diarrhea    Patient Active Problem List   Diagnosis Date Noted   Hyperkalemia, diminished renal excretion 05/27/2023   Allergic rhinitis 01/15/2023   Stage 3b chronic kidney disease (HCC) 01/07/2023   Primary hyperaldosteronism (HCC) 03/06/2022   Coronary artery disease involving native coronary artery of native heart without angina pectoris 02/12/2022   Adrenal adenoma, right 02/07/2022   Skin lesion of right arm 02/02/2022   Asthma 11/01/2021   Normocytic anemia 10/11/2021   Nodule of right lung 09/30/2021   Persistent cough 09/27/2021   Upper abdominal pain 08/03/2021   Dysthymia 03/12/2021   Primary osteoarthritis involving multiple joints 03/01/2021  Recurrent falls 01/10/2021   Sensorineural hearing loss (SNHL), 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   Persistent atrial fibrillation (HCC) 08/12/2020   Type 2 diabetes mellitus with hyperglycemia, with long-term current use of insulin (HCC) 06/21/2020   Dyslipidemia associated with type 2 diabetes mellitus (HCC) 06/21/2020   Mixed hyperlipidemia 06/21/2020   Uncontrolled hypertension 06/11/2020   Aortic atherosclerosis (HCC) 03/28/2020   Diabetic peripheral neuropathy (HCC) 12/26/2019   Gastroesophageal reflux disease without esophagitis 03/17/2019   Varicose veins of both legs with edema 02/16/2019   Heart murmur 09/05/2018   Restrictive lung disease 02/22/2018   Dysgeusia 06/11/2017   Osteoarthritis of left knee 06/03/2017   NAFLD (nonalcoholic fatty liver disease) 95/28/4132   Osteoporosis 12/31/2015   Obesity 12/19/2015   Acquired hypothyroidism 10/18/2015   Prolonged QT interval 10/03/2013   CHF (congestive heart failure) (HCC) 04/21/2013   Long term (current) use of anticoagulants 06/09/2012   BENIGN NEOPLASM OF  ADRENAL GLAND 11/25/2010   Chronic diastolic heart failure (HCC) 02/20/2010   Diabetes (HCC) 05/16/2009   GOUT 05/16/2009   OBESITY, MORBID 05/16/2009   Hypertension associated with diabetes (HCC) 05/16/2009   Paroxysmal atrial fibrillation (HCC) 05/16/2009   HYPERLIPIDEMIA 11/30/2008   Obstructive sleep apnea on CPAP 11/30/2008    Past Medical History:  Diagnosis Date   Arthritis    Back pain    BCC (basal cell carcinoma) 04/01/2023   right lower chin, Mohs completed on 04/21/23   Chronic anticoagulation    due to aflutter   Chronic kidney disease    Diabetes mellitus    Diastolic CHF, chronic (HCC)    a.  echo 2006 - ef 55-65%; mild diast dysfxn;    b. Echo 08/2011: Mild LVH, EF 60%;  c. 04/2013 Echo: EF 65-69%, mild conc LVH;  08/2014 Echo: EF 60-65%, mild-mod MR.   Gout    Hyperlipidemia    Hypertension    a.  Renal arterial Dopplers 12/2011: 1-59% right renal artery stenosis   Morbid obesity (HCC)    Obstructive sleep apnea on CPAP    Paroxysmal Afib/Flutter    a. 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    Past Surgical History:  Procedure Laterality Date   APPENDECTOMY     ATRIAL FLUTTER ABLATION N/A 09/24/2011   Procedure: ATRIAL FLUTTER ABLATION;  Surgeon: Marinus Maw, MD;  Location: [facility] CATH LAB;  Service: Cardiovascular;  Laterality: N/A;   CARDIOVERSION  10/22/2011   Procedure: CARDIOVERSION;  Surgeon: Duke Salvia, MD;  Location: [facility] OR;  Service: Cardiovascular;  Laterality: N/A;   CARDIOVERSION N/A 09/10/2011   Procedure: CARDIOVERSION;  Surgeon: Duke Salvia, MD;  Location: [facility] CATH LAB;  Service: Cardiovascular;  Laterality: N/A;   CHOLECYSTECTOMY     COLONOSCOPY WITH PROPOFOL N/A 06/13/2021   Procedure: COLONOSCOPY WITH PROPOFOL;  Surgeon: Rachael Fee, MD;  Location: [facility] ENDOSCOPY;  Service: Endoscopy;  Laterality: N/A;   POLYPECTOMY  06/13/2021   Procedure: POLYPECTOMY;  Surgeon: Rachael Fee, MD;  Location: [facility] ENDOSCOPY;  Service: Endoscopy;;    TONSILLECTOMY  1982   TOTAL ABDOMINAL HYSTERECTOMY      Social History   Socioeconomic History   Marital status: Married    Spouse name: Not on file   Number of children: 3   Years of education: Not on file   Highest education level: Not on file  Occupational History   Occupation: DISABILITY/housewife    Employer: RETIRED  Tobacco Use   Smoking status: Never   Smokeless tobacco: Never  Vaping Use  Vaping status: Never Used  Substance and Sexual Activity   Alcohol use: No   Drug use: No   Sexual activity: Yes  Other Topics Concern   Not on file  Social History Narrative   Not on file   Social Determinants of Health   Financial Resource Strain: Low Risk  (11/21/2022)   Overall Financial Resource Strain (CARDIA)    Difficulty of Paying Living Expenses: Not hard at all  Food Insecurity: No Food Insecurity (11/21/2022)   Hunger Vital Sign    Worried About Running Out of Food in the Last Year: Never true    Ran Out of Food in the Last Year: Never true  Transportation Needs: No Transportation Needs (11/21/2022)   PRAPARE - Administrator, Civil Service (Medical): No    Lack of Transportation (Non-Medical): No  Physical Activity: Inactive (11/21/2022)   Exercise Vital Sign    Days of Exercise per Week: 0 days    Minutes of Exercise per Session: 0 min  Stress: No Stress Concern Present (11/21/2022)   [employer] of Occupational Health - Occupational Stress Questionnaire    Feeling of Stress : Not at all  Social Connections: Socially Integrated (11/21/2022)   Social Connection and Isolation Panel [NHANES]    Frequency of Communication with Friends and Family: More than three times a week    Frequency of Social Gatherings with Friends and Family: Never    Attends Religious Services: More than 4 times per year    Active Member of [employer] or Organizations: Yes    Attends Engineer, structural: More than 4 times per year    Marital Status: Married  Careers information officer Violence: Not At Risk (11/21/2022)   Humiliation, Afraid, Rape, and Kick questionnaire    Fear of Current or Ex-Partner: No    Emotionally Abused: No    Physically Abused: No    Sexually Abused: No    Family History  Problem Relation Age of Onset   Heart disease Father    Hypertension Father    Breast cancer Sister    Cancer Sister        breast   Colon cancer Neg Hx    Esophageal cancer Neg Hx    Pancreatic cancer Neg Hx    Liver disease Neg Hx      Review of Systems  Constitutional: Negative.  Negative for chills and fever.  HENT: Negative.  Negative for congestion and sore throat.   Respiratory: Negative.  Negative for cough and shortness of breath.   Cardiovascular: Negative.  Negative for chest pain and palpitations.  Gastrointestinal:  Negative for abdominal pain, diarrhea, nausea and vomiting.  Genitourinary: Negative.  Negative for dysuria and hematuria.  Skin: Negative.  Negative for rash.  Neurological: Negative.  Negative for dizziness and headaches.  All other systems reviewed and are negative.   Vitals:   07/22/23 0956  BP: 124/64  Pulse: 74  Temp: 97.9 F (36.6 C)  SpO2: 94%    Physical Exam Vitals reviewed.  Constitutional:      Appearance: Normal appearance. She is obese.  HENT:     Head: Normocephalic.  Eyes:     Extraocular Movements: Extraocular movements intact.     Pupils: Pupils are equal, round, and reactive to light.  Cardiovascular:     Rate and Rhythm: Normal rate and regular rhythm.     Pulses: Normal pulses.     Heart sounds: Normal heart sounds.  Pulmonary:  Effort: Pulmonary effort is normal.     Breath sounds: Normal breath sounds.  Abdominal:     Palpations: Abdomen is soft.     Tenderness: There is no abdominal tenderness.  Musculoskeletal:     Cervical back: No tenderness.     Right lower leg: No edema.     Left lower leg: No edema.     Comments: Very mild edema both feet otherwise normal  Lymphadenopathy:      Cervical: No cervical adenopathy.  Skin:    General: Skin is warm and dry.  Neurological:     General: No focal deficit present.     Mental Status: She is alert and oriented to person, place, and time.  Psychiatric:        Mood and Affect: Mood normal.        Behavior: Behavior normal.      ASSESSMENT & PLAN: A total of 45 minutes was spent with the patient and counseling/coordination of care regarding preparing for this visit, review of most recent office visit notes, review of most recent endocrinologist office visit note, review of multiple chronic medical conditions under management, review of most recent blood work results, review of all medications, education on nutrition, cardiovascular risks associated with hypertension and diabetes, prognosis, documentation and need for follow-up.  Problem List Items Addressed This Visit       Cardiovascular and Mediastinum   Hypertension associated with diabetes (HCC)    BP Readings from Last 3 Encounters:  07/22/23 124/64  07/07/23 126/72  05/27/23 128/86  Well-controlled hypertension Continues Aldactone 25 mg daily, losartan 50 mg daily and metoprolol tartrate 25 mg twice a day Well-controlled diabetes on insulin pump Sees endocrinologist on a regular basis       Paroxysmal atrial fibrillation (HCC) - Primary    Sinus rhythm with good ventricular response today Continues anticoagulation with Eliquis 5 mg twice a day No significant clinical bleeding episodes. Continues Dronedarone 400 mg twice a day Continues metoprolol tartrate 25 mg twice a day      CHF (congestive heart failure) (HCC)    No signs of acute CHF. Peripheral edema much improved. Continues Demadex 20 mg daily Hypertension well-controlled Continues losartan 50 mg and metoprolol tartrate 25 mg twice a day      Aortic atherosclerosis (HCC)    Chronic stable condition Diet and nutrition discussed Continue rosuvastatin 10 mg daily      Coronary artery  disease involving native coronary artery of native heart without angina pectoris    No recent anginal episodes Stable chronic condition        Endocrine   Dyslipidemia associated with type 2 diabetes mellitus (HCC)    Chronic stable conditions On insulin pump Continue rosuvastatin 10 mg daily      Acquired hypothyroidism    Clinically stable Continues levothyroxine 112 mcg daily      Primary hyperaldosteronism (HCC)    Continues Aldactone 25 mg daily as per endocrinologist instructions        Genitourinary   Stage 3b chronic kidney disease (HCC)    Continues to be monitored by nephrology service Advised to stay well-hydrated and avoid NSAIDs        Other   RESOLVED: Bilateral leg edema    Much improved today. Continues Demadex 20 mg daily Leg elevation advised Low-salt diet advised Consider compression socks      Patient Instructions  Health Maintenance After Age 1 After age 76, you are at a higher risk for certain  long-term diseases and infections as well as injuries from falls. Falls are a major cause of broken bones and head injuries in people who are older than age 14. Getting regular preventive care can help to keep you healthy and well. Preventive care includes getting regular testing and making lifestyle changes as recommended by your health care provider. Talk with your health care provider about: Which screenings and tests you should have. A screening is a test that checks for a disease when you have no symptoms. A diet and exercise plan that is right for you. What should I know about screenings and tests to prevent falls? Screening and testing are the best ways to find a health problem early. Early diagnosis and treatment give you the best chance of managing medical conditions that are common after age 45. Certain conditions and lifestyle choices may make you more likely to have a fall. Your health care provider may recommend: Regular vision checks. Poor  vision and conditions such as cataracts can make you more likely to have a fall. If you wear glasses, make sure to get your prescription updated if your vision changes. Medicine review. Work with your health care provider to regularly review all of the medicines you are taking, including over-the-counter medicines. Ask your health care provider about any side effects that may make you more likely to have a fall. Tell your health care provider if any medicines that you take make you feel dizzy or sleepy. Strength and balance checks. Your health care provider may recommend certain tests to check your strength and balance while standing, walking, or changing positions. Foot health exam. Foot pain and numbness, as well as not wearing proper footwear, can make you more likely to have a fall. Screenings, including: Osteoporosis screening. Osteoporosis is a condition that causes the bones to get weaker and break more easily. Blood pressure screening. Blood pressure changes and medicines to control blood pressure can make you feel dizzy. Depression screening. You may be more likely to have a fall if you have a fear of falling, feel depressed, or feel unable to do activities that you used to do. Alcohol use screening. Using too much alcohol can affect your balance and may make you more likely to have a fall. Follow these instructions at home: Lifestyle Do not drink alcohol if: Your health care provider tells you not to drink. If you drink alcohol: Limit how much you have to: 0-1 drink a day for women. 0-2 drinks a day for men. Know how much alcohol is in your drink. In the [place] one drink equals one 12 oz bottle of beer (355 mL), one 5 oz glass of wine (148 mL), or one 1 oz glass of hard liquor (44 mL). Do not use any products that contain nicotine or tobacco. These products include cigarettes, chewing tobacco, and vaping devices, such as e-cigarettes. If you need help quitting, ask your health care  provider. Activity  Follow a regular exercise program to stay fit. This will help you maintain your balance. Ask your health care provider what types of exercise are appropriate for you. If you need a cane or walker, use it as recommended by your health care provider. Wear supportive shoes that have nonskid soles. Safety  Remove any tripping hazards, such as rugs, cords, and clutter. Install safety equipment such as grab bars in bathrooms and safety rails on stairs. Keep rooms and walkways well-lit. General instructions Talk with your health care provider about your risks for falling. Tell  your health care provider if: You fall. Be sure to tell your health care provider about all falls, even ones that seem minor. You feel dizzy, tiredness (fatigue), or off-balance. Take over-the-counter and prescription medicines only as told by your health care provider. These include supplements. Eat a healthy diet and maintain a healthy weight. A healthy diet includes low-fat dairy products, low-fat (lean) meats, and fiber from whole grains, beans, and lots of fruits and vegetables. Stay current with your vaccines. Schedule regular health, dental, and eye exams. Summary Having a healthy lifestyle and getting preventive care can help to protect your health and wellness after age 95. Screening and testing are the best way to find a health problem early and help you avoid having a fall. Early diagnosis and treatment give you the best chance for managing medical conditions that are more common for people who are older than age 8. Falls are a major cause of broken bones and head injuries in people who are older than age 58. Take precautions to prevent a fall at home. Work with your health care provider to learn what changes you can make to improve your health and wellness and to prevent falls. This information is not intended to replace advice given to you by your health care provider. Make sure you discuss  any questions you have with your health care provider. Document Revised: 02/25/2021 Document Reviewed: 02/25/2021 Elsevier Patient Education  2024 Elsevier Inc.     Edwina Barth, MD [facility]

## 2023-07-22 NOTE — Assessment & Plan Note (Addendum)
BP Readings from Last 3 Encounters:  07/22/23 124/64  07/07/23 126/72  05/27/23 128/86  Well-controlled hypertension Continues Aldactone 25 mg daily, losartan 50 mg daily and metoprolol tartrate 25 mg twice a day Well-controlled diabetes on insulin pump Sees endocrinologist on a regular basis

## 2023-07-22 NOTE — Assessment & Plan Note (Signed)
No recent anginal episodes Stable chronic condition

## 2023-07-23 NOTE — Telephone Encounter (Signed)
Spoke with pt and advised per Dr Graciela Husbands pt should increase Torsemide 20mg  to 2 tablets daily x 3 days and then return to 20mg  - 1 tablet by mouth daily.  Pt advised a referral has also been sent to CHF clinic and she will receive a call to schedule an appointment.  Pt verbalizes understanding and agrees with current plan.

## 2023-07-23 NOTE — Telephone Encounter (Signed)
2 things.  Lets have her take torsemide 40 x 3 days and then resume 20 And lets send her to heart failure clinic for more available management  [facility] and [facility]   thanks

## 2023-07-28 ENCOUNTER — Encounter: Payer: Self-pay | Admitting: Emergency Medicine

## 2023-07-28 ENCOUNTER — Ambulatory Visit (INDEPENDENT_AMBULATORY_CARE_PROVIDER_SITE_OTHER): Payer: PPO | Admitting: Emergency Medicine

## 2023-07-28 ENCOUNTER — Ambulatory Visit (INDEPENDENT_AMBULATORY_CARE_PROVIDER_SITE_OTHER): Payer: PPO

## 2023-07-28 VITALS — BP 132/86 | HR 67 | Temp 98.2°F | Ht 61.0 in

## 2023-07-28 DIAGNOSIS — I7090 Unspecified atherosclerosis: Secondary | ICD-10-CM | POA: Diagnosis not present

## 2023-07-28 DIAGNOSIS — M79671 Pain in right foot: Secondary | ICD-10-CM

## 2023-07-28 DIAGNOSIS — E2609 Other primary hyperaldosteronism: Secondary | ICD-10-CM

## 2023-07-28 MED ORDER — HYDROCODONE-ACETAMINOPHEN 5-325 MG PO TABS: 1.0000 | ORAL_TABLET | Freq: Four times a day (QID) | ORAL | 0 refills | Status: DC | PRN

## 2023-07-28 NOTE — Patient Instructions (Signed)
Foot Pain Many things can cause foot pain. Common causes include injuries to the foot. The injuries include sprains or broken bones, or injuries that affect the nerves in the feet. Other causes of foot pain include arthritis, blisters, and bunions. To know what causes your foot pain, your health care provider will take a detailed history of your symptoms. They will also do a physical exam as well as imaging tests, such as X-ray or MRI. Follow these instructions at home: Managing pain, stiffness, and swelling  If told, put ice on the painful area. Put ice in a plastic bag. Place a towel between your skin and the bag. Leave the ice on for 20 minutes, 2-3 times a day. If your skin turns bright red, remove the ice right away to prevent skin damage. The risk of damage is higher if you cannot feel pain, heat, or cold. Activity Do not stand or walk for long periods. Do stretches to relieve foot pain and stiffness as told by your provider. Do not lift anything that is heavier than 10 lb (4.5 kg), or the limit that you are told, until your provider says that it is safe. Lifting a lot of weight can put added pressure on your feet. Return to your normal activities as told by your provider. Ask your provider what activities are safe for you. Lifestyle Wear comfortable, supportive shoes that fit you well. Do not wear high heels. Keep your feet clean and dry. General instructions Take over-the-counter and prescription medicines only as told by your provider. Rub your foot gently. Pay attention to any changes in your symptoms. Let your provider know if symptoms become worse. Keep all follow-up visits. Your provider will want to monitor your progress. Contact a health care provider if: Your pain does not get better after a few days of treatment at home. Your pain gets worse. You cannot stand on your foot. Your foot or toes are swollen. Your foot is numb or tingling. Get help right away if: Your foot  or toes turn white or blue. You have warmth and redness along your foot. This information is not intended to replace advice given to you by your health care provider. Make sure you discuss any questions you have with your health care provider. Document Revised: 10/30/2022 Document Reviewed: 07/08/2022 Elsevier Patient Education  2024 Elsevier Inc.  

## 2023-07-28 NOTE — Progress Notes (Signed)
[name] 82 y.o.   Chief Complaint  Patient presents with   Foot Pain    Patient states she is having some pain and swelling right foot.     HISTORY OF PRESENT ILLNESS: Acute problem visit today. This is a 82 y.o. female complaining of right foot pain that started about a week ago.  Denies injury or any other associated symptoms. No other complaints or medical concerns today.  Foot Pain Pertinent negatives include no abdominal pain, chest pain, chills, congestion, coughing, fever, headaches, nausea, rash, sore throat or vomiting.     Prior to Admission medications   Medication Sig Start Date End Date Taking? Authorizing Provider  Accu-Chek Softclix Lancets lancets 1 each by Other route 3 (three) times daily. as directed 03/30/20  Yes Janeece Agee, NP  allopurinol (ZYLOPRIM) 300 MG tablet TAKE 1 TABLET BY MOUTH DAILY 11/13/22  Yes Georgina Quint, MD  amLODipine (NORVASC) 5 MG tablet  06/03/23  Yes [provider]  azelastine (ASTELIN) 0.1 % nasal spray 1-2 puffs each nostril at bedtime as needed 01/02/22  Yes Young, Joni Fears D, MD  b complex vitamins capsule Take 1 capsule by mouth daily.   Yes [provider]  BD PEN NEEDLE NANO 2ND GEN 32G X 4 MM MISC USE TWICE DAILY AS NEEDED 08/02/22  Yes Hillard Goodwine, Eilleen Kempf, MD  blood glucose meter kit and supplies Dispense based on patient and insurance preference. Use up to four times daily as directed. (FOR ICD-10 E10.9, E11.9). 08/29/21  Yes Codylee Patil, Eilleen Kempf, MD  cholecalciferol (VITAMIN D3) 25 MCG (1000 UNIT) tablet Take 1,000 Units by mouth daily.   Yes [provider]  clobetasol cream (TEMOVATE) 0.05 % Apply 1 Application topically 2 (two) times daily. 06/05/22  Yes Jerolyn Flenniken, Eilleen Kempf, MD  cloNIDine (CATAPRES) 0.3 MG tablet TAKE 1 TABLET BY MOUTH TWICE  DAILY 11/06/22  Yes Emmerson Shuffield, Eilleen Kempf, MD  Continuous Blood Gluc Receiver (FREESTYLE LIBRE 2 READER) DEVI Use to check blood glucose. Use  as directed 01/08/23  Yes Brayleigh Rybacki, Eilleen Kempf, MD  Continuous Glucose Sensor (FREESTYLE LIBRE 2 SENSOR) MISC USE AS DIRECTED TO CHECK BLOOD  SUGAR DAILY 02/11/23  Yes Georgina Quint, MD  dronedarone (MULTAQ) 400 MG tablet Take 1 tablet (400 mg total) by mouth 2 (two) times daily. 01/19/23  Yes Duke Salvia, MD  ELIQUIS 5 MG TABS tablet TAKE 1 TABLET BY MOUTH TWICE  DAILY 04/28/23  Yes Duke Salvia, MD  Glucagon (GVOKE HYPOPEN 2-PACK) 0.5 MG/0.1ML SOAJ Inject 0.5 mg into the skin daily as needed. For Hypoglycemic events 05/30/22  Yes Chanika Byland, Eilleen Kempf, MD  HYDROcodone-acetaminophen [facility]) 5-325 MG tablet Take 1 tablet by mouth every 6 (six) hours as needed for moderate pain or severe pain. 04/21/23  Yes Holiday Mcmenamin, Eilleen Kempf, MD  hydrocortisone 2.5 % cream Apply topically 2 (two) times daily as needed (Rash). 04/13/23  Yes Terri Piedra, DO  hydrOXYzine (ATARAX) 10 MG tablet Take 1 tablet (10 mg total) by mouth every 6 (six) hours as needed. 08/12/22  Yes Paiten Boies, Eilleen Kempf, MD  hydrOXYzine (VISTARIL) 25 MG capsule Take 1 capsule (25 mg total) by mouth every 8 (eight) hours as needed. 01/15/23  Yes Corwin Levins, MD  Insulin Lispro Prot & Lispro (HUMALOG MIX 75/25 KWIKPEN) (75-25) 100 UNIT/ML Kwikpen Inject 20 Units into the skin 2 (two) times daily before a meal. 07/07/23  Yes [name], Konrad Dolores, MD  ketoconazole (NIZORAL) 2 % cream Apply 1 Application  topically 2 (two) times daily. 04/13/23 08/11/23 Yes Terri Piedra, DO  ketorolac (ACULAR) 0.5 % ophthalmic solution SMARTSIG:In Eye(s) 08/22/21  Yes [provider]  levothyroxine (SYNTHROID) 112 MCG tablet Take 1 tablet (112 mcg total) by mouth daily. 04/09/23  Yes Desia Saban, Eilleen Kempf, MD  lidocaine (LIDODERM) 5 % Place 1 patch onto the skin daily. Remove & Discard patch within 12 hours or as directed by MD 08/29/20  Yes Kathryne Hitch, MD  losartan (COZAAR) 50 MG tablet Take 1 tablet (50 mg total) by mouth  daily. 07/07/23  Yes [name], Konrad Dolores, MD  metoprolol tartrate (LOPRESSOR) 25 MG tablet Take 1 tablet (25 mg total) by mouth 2 (two) times daily. 10/29/22 10/24/23 Yes Garnette Greb, Eilleen Kempf, MD  nystatin powder Apply 1 Application topically 2 (two) times daily. 08/12/22  Yes Lionel Woodberry, Eilleen Kempf, MD  nystatin-triamcinolone ointment [facility]) For up to 1 week as needed for rash them discontinue. May restart as needed for flares. Avoid applying to face, groin, and axilla. Use as directed. Long-term use can cause thinning of the skin. 04/01/23  Yes Terri Piedra, DO  [facility] VERIO test strip 1 each by Other route 3 (three) times daily. 06/08/20  Yes [provider]  predniSONE (DELTASONE) 10 MG tablet 3 tabs by mouth per day for 3 days,2tabs per day for 3 days,1tab per day for 3 days 01/15/23  Yes Corwin Levins, MD  RESTASIS MULTIDOSE 0.05 % ophthalmic emulsion Place 1 drop into both eyes 2 (two) times daily. 03/04/20  Yes [provider]  rosuvastatin (CRESTOR) 10 MG tablet TAKE 1 TABLET BY MOUTH IN THE  EVENING 02/11/23  Yes Wolf Boulay, Eilleen Kempf, MD  spironolactone (ALDACTONE) 25 MG tablet Take 1 tablet (25 mg total) by mouth daily. 07/08/23  Yes [name], Konrad Dolores, MD  tirzepatide [facility]) 7.5 MG/0.5ML Pen Inject 7.5 mg into the skin once a week. 07/07/23  Yes [name], Konrad Dolores, MD  TOBRADEX ophthalmic ointment  08/25/21  Yes [provider]  torsemide (DEMADEX) 20 MG tablet Take 1 tablet (20 mg total) by mouth daily. 01/19/23  Yes Duke Salvia, MD  triamcinolone cream (KENALOG) 0.1 % Apply 1 application topically 2 (two) times daily. 07/26/20  Yes Halah Whiteside, Eilleen Kempf, MD  VELTASSA 8.4 g packet Take 1 packet by mouth daily. 04/17/23  Yes [provider]  vitamin B-12 (CYANOCOBALAMIN) 1000 MCG tablet Take 1,000 mcg by mouth daily.   Yes [provider]  ipratropium (ATROVENT HFA) 17 MCG/ACT inhaler Inhale 2 puffs into the lungs  every 6 (six) hours as needed for wheezing. 12/17/21 02/09/23  Waymon Budge, MD  potassium chloride (KLOR-CON) 10 MEQ tablet Take 4 tablets (40 mEq total) by mouth daily. 12/10/21 03/06/22  Graciella Freer, PA-C    Allergies  Allergen Reactions   Doxycycline     Made patient feel very ill    Metformin Diarrhea    Patient Active Problem List   Diagnosis Date Noted   Hyperkalemia, diminished renal excretion 05/27/2023   Allergic rhinitis 01/15/2023   Stage 3b chronic kidney disease (HCC) 01/07/2023   Primary hyperaldosteronism (HCC) 03/06/2022   Coronary artery disease involving native coronary artery of native heart without angina pectoris 02/12/2022   Adrenal adenoma, right 02/07/2022   Skin lesion of right arm 02/02/2022   Asthma 11/01/2021   Normocytic anemia 10/11/2021   Nodule of right lung 09/30/2021   Dysthymia 03/12/2021   Primary osteoarthritis involving multiple joints 03/01/2021   Recurrent  falls 01/10/2021   Sensorineural hearing loss (SNHL), 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   Persistent atrial fibrillation (HCC) 08/12/2020   Type 2 diabetes mellitus with hyperglycemia, with long-term current use of insulin (HCC) 06/21/2020   Dyslipidemia associated with type 2 diabetes mellitus (HCC) 06/21/2020   Mixed hyperlipidemia 06/21/2020   Uncontrolled hypertension 06/11/2020   Aortic atherosclerosis (HCC) 03/28/2020   Diabetic peripheral neuropathy (HCC) 12/26/2019   Gastroesophageal reflux disease without esophagitis 03/17/2019   Varicose veins of both legs with edema 02/16/2019   Heart murmur 09/05/2018   Restrictive lung disease 02/22/2018   Dysgeusia 06/11/2017   Osteoarthritis of left knee 06/03/2017   NAFLD (nonalcoholic fatty liver disease) 16/96/7893   Osteoporosis 12/31/2015   Obesity 12/19/2015   Acquired hypothyroidism 10/18/2015   Prolonged QT interval 10/03/2013   CHF (congestive heart failure) (HCC) 04/21/2013   Long term (current) use of anticoagulants  06/09/2012   BENIGN NEOPLASM OF ADRENAL GLAND 11/25/2010   Chronic diastolic heart failure (HCC) 02/20/2010   Diabetes (HCC) 05/16/2009   GOUT 05/16/2009   OBESITY, MORBID 05/16/2009   Hypertension associated with diabetes (HCC) 05/16/2009   Paroxysmal atrial fibrillation (HCC) 05/16/2009   HYPERLIPIDEMIA 11/30/2008   Obstructive sleep apnea on CPAP 11/30/2008    Past Medical History:  Diagnosis Date   Arthritis    Back pain    BCC (basal cell carcinoma) 04/01/2023   right lower chin, Mohs completed on 04/21/23   Chronic anticoagulation    due to aflutter   Chronic kidney disease    Diabetes mellitus    Diastolic CHF, chronic (HCC)    a.  echo 2006 - ef 55-65%; mild diast dysfxn;    b. Echo 08/2011: Mild LVH, EF 60%;  c. 04/2013 Echo: EF 65-69%, mild conc LVH;  08/2014 Echo: EF 60-65%, mild-mod MR.   Gout    Hyperlipidemia    Hypertension    a.  Renal arterial Dopplers 12/2011: 1-59% right renal artery stenosis   Morbid obesity (HCC)    Obstructive sleep apnea on CPAP    Paroxysmal Afib/Flutter    a. 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    Past Surgical History:  Procedure Laterality Date   APPENDECTOMY     ATRIAL FLUTTER ABLATION N/A 09/24/2011   Procedure: ATRIAL FLUTTER ABLATION;  Surgeon: Marinus Maw, MD;  Location[facility] CATH LAB;  Service: Cardiovascular;  Laterality: N/A;   CARDIOVERSION  10/22/2011   Procedure: CARDIOVERSION;  Surgeon: Duke Salvia, MD;  Location: [facility] OR;  Service: Cardiovascular;  Laterality: N/A;   CARDIOVERSION N/A 09/10/2011   Procedure: CARDIOVERSION;  Surgeon: Duke Salvia, MD;  Location: [facility] CATH LAB;  Service: Cardiovascular;  Laterality: N/A;   CHOLECYSTECTOMY     COLONOSCOPY WITH PROPOFOL N/A 06/13/2021   Procedure: COLONOSCOPY WITH PROPOFOL;  Surgeon: Rachael Fee, MD;  Location: [facility] ENDOSCOPY;  Service: Endoscopy;  Laterality: N/A;   POLYPECTOMY  06/13/2021   Procedure: POLYPECTOMY;  Surgeon: Rachael Fee, MD;  Location: [facility]  ENDOSCOPY;  Service: Endoscopy;;   TONSILLECTOMY  1982   TOTAL ABDOMINAL HYSTERECTOMY      Social History   Socioeconomic History   Marital status: Married    Spouse name: Not on file   Number of children: 3   Years of education: Not on file   Highest education level: Not on file  Occupational History   Occupation: DISABILITY/housewife    Employer: RETIRED  Tobacco Use   Smoking status: Never   Smokeless tobacco: Never  Vaping Use  Vaping status: Never Used  Substance and Sexual Activity   Alcohol use: No   Drug use: No   Sexual activity: Yes  Other Topics Concern   Not on file  Social History Narrative   Not on file   Social Determinants of Health   Financial Resource Strain: Low Risk  (11/21/2022)   Overall Financial Resource Strain (CARDIA)    Difficulty of Paying Living Expenses: Not hard at all  Food Insecurity: No Food Insecurity (11/21/2022)   Hunger Vital Sign    Worried About Running Out of Food in the Last Year: Never true    Ran Out of Food in the Last Year: Never true  Transportation Needs: No Transportation Needs (11/21/2022)   PRAPARE - Administrator, Civil Service (Medical): No    Lack of Transportation (Non-Medical): No  Physical Activity: Inactive (11/21/2022)   Exercise Vital Sign    Days of Exercise per Week: 0 days    Minutes of Exercise per Session: 0 min  Stress: No Stress Concern Present (11/21/2022)   [employer] of Occupational Health - Occupational Stress Questionnaire    Feeling of Stress : Not at all  Social Connections: Socially Integrated (11/21/2022)   Social Connection and Isolation Panel [NHANES]    Frequency of Communication with Friends and Family: More than three times a week    Frequency of Social Gatherings with Friends and Family: Never    Attends Religious Services: More than 4 times per year    Active Member of [employer] or Organizations: Yes    Attends Engineer, structural: More than 4 times per year     Marital Status: Married  Catering manager Violence: Not At Risk (11/21/2022)   Humiliation, Afraid, Rape, and Kick questionnaire    Fear of Current or Ex-Partner: No    Emotionally Abused: No    Physically Abused: No    Sexually Abused: No    Family History  Problem Relation Age of Onset   Heart disease Father    Hypertension Father    Breast cancer Sister    Cancer Sister        breast   Colon cancer Neg Hx    Esophageal cancer Neg Hx    Pancreatic cancer Neg Hx    Liver disease Neg Hx      Review of Systems  Constitutional: Negative.  Negative for chills and fever.  HENT: Negative.  Negative for congestion and sore throat.   Respiratory: Negative.  Negative for cough and shortness of breath.   Cardiovascular: Negative.  Negative for chest pain and palpitations.  Gastrointestinal:  Negative for abdominal pain, diarrhea, nausea and vomiting.  Genitourinary: Negative.  Negative for dysuria and hematuria.  Skin: Negative.  Negative for rash.  Neurological: Negative.  Negative for dizziness and headaches.  All other systems reviewed and are negative.   Today's Vitals   07/28/23 1404  BP: 132/86  Pulse: 67  Temp: 98.2 F (36.8 C)  TempSrc: Oral  SpO2: 95%  Height: 5\' 1"  (1.549 m)   Body mass index is 50.28 kg/m.   Physical Exam Vitals reviewed.  Constitutional:      Appearance: Normal appearance.  HENT:     Head: Normocephalic.  Eyes:     Extraocular Movements: Extraocular movements intact.  Cardiovascular:     Rate and Rhythm: Normal rate.  Pulmonary:     Effort: Pulmonary effort is normal.  Musculoskeletal:     Right lower leg: No edema.  Left lower leg: No edema.     Comments: Right foot: Mild lateral swelling with tenderness.  No erythema or ecchymosis.  Very tender to touch.  No skin breakdowns.  No visible ulcers.  Excellent peripheral circulation. No edema of lower extremities  Skin:    General: Skin is warm and dry.  Neurological:     Mental  Status: She is alert and oriented to person, place, and time.  Psychiatric:        Mood and Affect: Mood normal.        Behavior: Behavior normal.      ASSESSMENT & PLAN: A total of 32 minutes was spent with the patient and counseling/coordination of care regarding preparing for this visit, review of most recent office visit notes, differential diagnosis of foot pain, review of x-ray report done today, review of all medications, pain management, prognosis, documentation and need for follow-up.   Problem List Items Addressed This Visit       Endocrine   Primary hyperaldosteronism (HCC)     Other   Right foot pain - Primary    Clinical findings compatible with possible fracture Very tender on physical examination X-ray done today.  Report reviewed. Pain management discussed. Recommend Tylenol for mild to moderate pain and Norco for moderate to severe pain. Advised to rest and keep foot elevated when possible May need podiatry referral.       Relevant Medications   HYDROcodone-acetaminophen (NORCO) 5-325 MG tablet   Other Relevant Orders   DG Foot Complete Right   Patient Instructions  Foot Pain Many things can cause foot pain. Common causes include injuries to the foot. The injuries include sprains or broken bones, or injuries that affect the nerves in the feet. Other causes of foot pain include arthritis, blisters, and bunions. To know what causes your foot pain, your health care provider will take a detailed history of your symptoms. They will also do a physical exam as well as imaging tests, such as X-ray or MRI. Follow these instructions at home: Managing pain, stiffness, and swelling  If told, put ice on the painful area. Put ice in a plastic bag. Place a towel between your skin and the bag. Leave the ice on for 20 minutes, 2-3 times a day. If your skin turns bright red, remove the ice right away to prevent skin damage. The risk of damage is higher if you cannot feel  pain, heat, or cold. Activity Do not stand or walk for long periods. Do stretches to relieve foot pain and stiffness as told by your provider. Do not lift anything that is heavier than 10 lb (4.5 kg), or the limit that you are told, until your provider says that it is safe. Lifting a lot of weight can put added pressure on your feet. Return to your normal activities as told by your provider. Ask your provider what activities are safe for you. Lifestyle Wear comfortable, supportive shoes that fit you well. Do not wear high heels. Keep your feet clean and dry. General instructions Take over-the-counter and prescription medicines only as told by your provider. Rub your foot gently. Pay attention to any changes in your symptoms. Let your provider know if symptoms become worse. Keep all follow-up visits. Your provider will want to monitor your progress. Contact a health care provider if: Your pain does not get better after a few days of treatment at home. Your pain gets worse. You cannot stand on your foot. Your foot or toes are  swollen. Your foot is numb or tingling. Get help right away if: Your foot or toes turn white or blue. You have warmth and redness along your foot. This information is not intended to replace advice given to you by your health care provider. Make sure you discuss any questions you have with your health care provider. Document Revised: 10/30/2022 Document Reviewed: 07/08/2022 Elsevier Patient Education  2024 Elsevier Inc.    Edwina Barth, MD [facility]

## 2023-07-28 NOTE — Assessment & Plan Note (Addendum)
Clinical findings compatible with possible fracture Very tender on physical examination X-ray done today.  Report reviewed. Pain management discussed. Recommend Tylenol for mild to moderate pain and Norco for moderate to severe pain. Advised to rest and keep foot elevated when possible May need podiatry referral.

## 2023-07-29 NOTE — Telephone Encounter (Signed)
Appt sch for 11/11 with Dr Gasper Lloyd

## 2023-08-04 NOTE — Addendum Note (Signed)
Addended by: Alois Cliche on: 08/04/2023 11:35 AM   Modules accepted: Orders

## 2023-08-04 NOTE — Telephone Encounter (Signed)
Spoke with pt and advised she will be contacted to schedule an echocardiogram.  Pt verbalizes understanding and agrees with current plan.

## 2023-08-05 ENCOUNTER — Other Ambulatory Visit: Payer: Self-pay

## 2023-08-05 ENCOUNTER — Telehealth: Payer: Self-pay | Admitting: Internal Medicine

## 2023-08-05 DIAGNOSIS — E1165 Type 2 diabetes mellitus with hyperglycemia: Secondary | ICD-10-CM

## 2023-08-05 MED ORDER — FREESTYLE LIBRE 2 SENSOR MISC: 2 refills | Status: AC

## 2023-08-05 MED ORDER — FREESTYLE LIBRE 2 SENSOR MISC: 2 refills | Status: DC

## 2023-08-05 NOTE — Telephone Encounter (Signed)
MEDICATION: FREESTYLE LIBRE 2 SENSOR   PHARMACY:  [facility] on [address] in [place]   HAS THE PATIENT CONTACTED THEIR PHARMACY?  YES   IS THIS A 90 DAY SUPPLY : N/A  IS PATIENT OUT OF MEDICATION: yes - has no back blood sugar meter  IF NOT; HOW MUCH IS LEFT: none  LAST APPOINTMENT DATE: @9 /18/2024  NEXT APPOINTMENT DATE:@10 /17/2024  DO WE HAVE YOUR PERMISSION TO LEAVE A DETAILED MESSAGE?:  OTHER COMMENTS: suggested to patient to always have a back up way of checking blood sugar, call back number [phone]   **Let patient know to contact pharmacy at the end of the day to make sure medication is ready. **  ** Please notify patient to allow 48-72 hours to process**  **Encourage patient to contact the pharmacy for refills or they can request refills through [facility]**

## 2023-08-05 NOTE — Telephone Encounter (Signed)
Free Style Libre Sensor refill request complete

## 2023-08-06 ENCOUNTER — Other Ambulatory Visit: Payer: Self-pay

## 2023-08-07 ENCOUNTER — Other Ambulatory Visit: Payer: Self-pay | Admitting: Dermatology

## 2023-08-07 ENCOUNTER — Other Ambulatory Visit: Payer: Self-pay | Admitting: Internal Medicine

## 2023-08-07 DIAGNOSIS — L304 Erythema intertrigo: Secondary | ICD-10-CM

## 2023-08-10 ENCOUNTER — Ambulatory Visit: Payer: PPO | Admitting: Family Medicine

## 2023-08-10 ENCOUNTER — Encounter: Payer: Self-pay | Admitting: Family Medicine

## 2023-08-10 VITALS — BP 122/52 | HR 66 | Temp 97.8°F | Resp 20 | Ht 61.0 in | Wt 260.0 lb

## 2023-08-10 DIAGNOSIS — M545 Low back pain, unspecified: Secondary | ICD-10-CM

## 2023-08-10 DIAGNOSIS — N184 Chronic kidney disease, stage 4 (severe): Secondary | ICD-10-CM

## 2023-08-10 MED ORDER — HYDROCODONE-ACETAMINOPHEN 7.5-325 MG PO TABS: 1.0000 | ORAL_TABLET | Freq: Three times a day (TID) | ORAL | 0 refills | Status: AC | PRN

## 2023-08-10 MED ORDER — METHOCARBAMOL 500 MG PO TABS
500.0000 mg | ORAL_TABLET | Freq: Three times a day (TID) | ORAL | 0 refills | Status: DC | PRN
Start: 2023-08-10 — End: 2023-09-22

## 2023-08-10 NOTE — Progress Notes (Signed)
Assessment & Plan:  1. Right lumbar pain Education provided on acute back pain. - methocarbamol (ROBAXIN) 500 MG tablet; Take 1 tablet (500 mg total) by mouth every 8 (eight) hours as needed.  Dispense: 30 tablet; Refill: 0 - HYDROcodone-acetaminophen (NORCO) 7.5-325 MG tablet; Take 1 tablet by mouth every 8 (eight) hours as needed for up to 5 days for moderate pain (pain score 4-6).  Dispense: 15 tablet; Refill: 0  2. Stage 4 chronic kidney disease (HCC)  Discussed diagnosis as patient reports that she was unaware.  Reviewed lab work and most recent nephrology note with her.  Discussed the importance of controlling her blood pressure, diabetes, and heart failure.  Advised against the use of all NSAIDs.  She is unsure when she was to follow-up with nephrology; advised that she call and schedule her next appointment.  Phone number provided.    Follow up plan: Return if symptoms worsen or fail to improve.  [name], MSN, APRN, FNP-C  Subjective:  HPI: [name] is a 82 y.o. female presenting on 08/10/2023 for Back Pain (X 1 week - lower right side of back /No known injury )  Patient complains of acute low back pain. This is evaluated as a personal injury. The patient first noted symptoms 1week ago. It was related to no known injury. The pain is rated severe, and is located at the right lumbar area. The pain is described as stabbing and occurs all day. The symptoms have been progressive. Symptoms are exacerbated by  movement . Factors which relieve the pain include nothing. Other associated symptoms include no other symptoms. No changes in bowel or urination; last BM two days ago. Previous history of symptoms: never. Treatment efforts have included Norco, without relief and a heating pad which is helpful.    ROS: Negative unless specifically indicated above in HPI.   Relevant past medical history reviewed and updated as indicated.   Allergies and medications reviewed and  updated.   Current Outpatient Medications:    Accu-Chek Softclix Lancets lancets, 1 each by Other route 3 (three) times daily. as directed, Disp: 100 each, Rfl: 3   allopurinol (ZYLOPRIM) 300 MG tablet, TAKE 1 TABLET BY MOUTH DAILY, Disp: 100 tablet, Rfl: 2   amLODipine (NORVASC) 5 MG tablet, , Disp: , Rfl:    azelastine (ASTELIN) 0.1 % nasal spray, 1-2 puffs each nostril at bedtime as needed, Disp: 30 mL, Rfl: 12   b complex vitamins capsule, Take 1 capsule by mouth daily., Disp: , Rfl:    BD PEN NEEDLE NANO 2ND GEN 32G X 4 MM MISC, USE TWICE DAILY AS NEEDED, Disp: 100 each, Rfl: 11   blood glucose meter kit and supplies, Dispense based on patient and insurance preference. Use up to four times daily as directed. (FOR ICD-10 E10.9, E11.9)., Disp: 1 each, Rfl: 0   cholecalciferol (VITAMIN D3) 25 MCG (1000 UNIT) tablet, Take 1,000 Units by mouth daily., Disp: , Rfl:    clobetasol cream (TEMOVATE) 0.05 %, Apply 1 Application topically 2 (two) times daily., Disp: 30 g, Rfl: 1   cloNIDine (CATAPRES) 0.3 MG tablet, TAKE 1 TABLET BY MOUTH TWICE  DAILY, Disp: 200 tablet, Rfl: 2   Continuous Blood Gluc Receiver (FREESTYLE LIBRE 2 READER) DEVI, Use to check blood glucose. Use as directed, Disp: 1 each, Rfl: 3   Continuous Glucose Sensor (FREESTYLE LIBRE 2 SENSOR) MISC, USE AS DIRECTED TO CHECK BLOOD  SUGAR DAILY, Disp: 8 each, Rfl: 2   dronedarone (MULTAQ)  400 MG tablet, Take 1 tablet (400 mg total) by mouth 2 (two) times daily. Please keep scheduled appointment for future refills. Thank you., Disp: 180 tablet, Rfl: 0   ELIQUIS 5 MG TABS tablet, TAKE 1 TABLET BY MOUTH TWICE  DAILY, Disp: 200 tablet, Rfl: 2   Glucagon (GVOKE HYPOPEN 2-PACK) 0.5 MG/0.1ML SOAJ, Inject 0.5 mg into the skin daily as needed. For Hypoglycemic events, Disp: 0.2 mL, Rfl: 5   HYDROcodone-acetaminophen (NORCO) 5-325 MG tablet, Take 1 tablet by mouth every 6 (six) hours as needed for moderate pain or severe pain., Disp: 15 tablet, Rfl:  0   hydrocortisone 2.5 % cream, APPLY TOPICALLY 2 TIMES DAILY AS NEEDED FOR RASH, Disp: 454 g, Rfl: 0   hydrOXYzine (ATARAX) 10 MG tablet, Take 1 tablet (10 mg total) by mouth every 6 (six) hours as needed., Disp: 30 tablet, Rfl: 1   hydrOXYzine (VISTARIL) 25 MG capsule, Take 1 capsule (25 mg total) by mouth every 8 (eight) hours as needed., Disp: 30 capsule, Rfl: 3   Insulin Lispro Prot & Lispro (HUMALOG MIX 75/25 KWIKPEN) (75-25) 100 UNIT/ML Kwikpen, Inject 20 Units into the skin 2 (two) times daily before a meal., Disp: 45 mL, Rfl: 2   ipratropium (ATROVENT HFA) 17 MCG/ACT inhaler, Inhale 2 puffs into the lungs every 6 (six) hours as needed for wheezing., Disp: 1 each, Rfl: 2   ketoconazole (NIZORAL) 2 % cream, Apply 1 Application topically 2 (two) times daily., Disp: 60 g, Rfl: 1   ketorolac (ACULAR) 0.5 % ophthalmic solution, SMARTSIG:In Eye(s), Disp: , Rfl:    levothyroxine (SYNTHROID) 112 MCG tablet, Take 1 tablet (112 mcg total) by mouth daily., Disp: 90 tablet, Rfl: 3   lidocaine (LIDODERM) 5 %, Place 1 patch onto the skin daily. Remove & Discard patch within 12 hours or as directed by MD, Disp: 30 patch, Rfl: 0   losartan (COZAAR) 50 MG tablet, Take 1 tablet (50 mg total) by mouth daily., Disp: 90 tablet, Rfl: 3   metoprolol tartrate (LOPRESSOR) 25 MG tablet, Take 1 tablet (25 mg total) by mouth 2 (two) times daily., Disp: 180 tablet, Rfl: 3   nystatin powder, Apply 1 Application topically 2 (two) times daily., Disp: 60 g, Rfl: 7   nystatin-triamcinolone ointment (MYCOLOG), For up to 1 week as needed for rash them discontinue. May restart as needed for flares. Avoid applying to face, groin, and axilla. Use as directed. Long-term use can cause thinning of the skin., Disp: 30 g, Rfl: 1   ONETOUCH VERIO test strip, 1 each by Other route 3 (three) times daily., Disp: , Rfl:    predniSONE (DELTASONE) 10 MG tablet, 3 tabs by mouth per day for 3 days,2tabs per day for 3 days,1tab per day for 3  days, Disp: 18 tablet, Rfl: 0   RESTASIS MULTIDOSE 0.05 % ophthalmic emulsion, Place 1 drop into both eyes 2 (two) times daily., Disp: , Rfl:    rosuvastatin (CRESTOR) 10 MG tablet, TAKE 1 TABLET BY MOUTH IN THE  EVENING, Disp: 100 tablet, Rfl: 2   spironolactone (ALDACTONE) 25 MG tablet, Take 1 tablet (25 mg total) by mouth daily., Disp: 90 tablet, Rfl: 3   tirzepatide (MOUNJARO) 7.5 MG/0.5ML Pen, Inject 7.5 mg into the skin once a week., Disp: 6 mL, Rfl: 3   TOBRADEX ophthalmic ointment, , Disp: , Rfl:    torsemide (DEMADEX) 20 MG tablet, Take 1 tablet (20 mg total) by mouth daily. Please keep scheduled appointment for future refills. Thank  you., Disp: 90 tablet, Rfl: 0   triamcinolone cream (KENALOG) 0.1 %, Apply 1 application topically 2 (two) times daily., Disp: 30 g, Rfl: 0   VELTASSA 8.4 g packet, Take 1 packet by mouth daily., Disp: , Rfl:    vitamin B-12 (CYANOCOBALAMIN) 1000 MCG tablet, Take 1,000 mcg by mouth daily., Disp: , Rfl:   Allergies  Allergen Reactions   Doxycycline     Made patient feel very ill    Metformin Diarrhea    Objective:   BP (!) 122/52   Pulse 66   Temp 97.8 F (36.6 C)   Resp 20   Ht 5\' 1"  (1.549 m)   Wt 260 lb (117.9 kg)   BMI 49.13 kg/m    Physical Exam Vitals reviewed.  Constitutional:      General: She is not in acute distress.    Appearance: Normal appearance. She is not ill-appearing, toxic-appearing or diaphoretic.  HENT:     Head: Normocephalic and atraumatic.  Eyes:     General: No scleral icterus.       Right eye: No discharge.        Left eye: No discharge.     Conjunctiva/sclera: Conjunctivae normal.  Cardiovascular:     Rate and Rhythm: Normal rate.  Pulmonary:     Effort: Pulmonary effort is normal. No respiratory distress.  Musculoskeletal:        General: Normal range of motion.     Cervical back: Normal range of motion.     Lumbar back: Tenderness (right side) present. No bony tenderness.     Comments: Examined in  wheelchair as patient was unable to get on exam table for exam.  Skin:    General: Skin is warm and dry.     Capillary Refill: Capillary refill takes less than 2 seconds.  Neurological:     General: No focal deficit present.     Mental Status: She is alert and oriented to person, place, and time. Mental status is at baseline.     Gait: Gait abnormal (riding in wheelchair).  Psychiatric:        Mood and Affect: Mood normal.        Behavior: Behavior normal.        Thought Content: Thought content normal.        Judgment: Judgment normal.

## 2023-08-10 NOTE — Patient Instructions (Addendum)
[facility] - call and see when they want to see you again. [phone]

## 2023-08-11 ENCOUNTER — Ambulatory Visit: Payer: PPO | Admitting: Emergency Medicine

## 2023-08-12 ENCOUNTER — Ambulatory Visit (INDEPENDENT_AMBULATORY_CARE_PROVIDER_SITE_OTHER): Payer: PPO | Admitting: Podiatry

## 2023-08-12 DIAGNOSIS — Z91199 Patient's noncompliance with other medical treatment and regimen due to unspecified reason: Secondary | ICD-10-CM

## 2023-08-12 NOTE — Progress Notes (Signed)
1. No-show for appointment     

## 2023-08-17 ENCOUNTER — Ambulatory Visit: Payer: PPO | Admitting: Family Medicine

## 2023-08-17 DIAGNOSIS — E119 Type 2 diabetes mellitus without complications: Secondary | ICD-10-CM | POA: Diagnosis not present

## 2023-08-17 DIAGNOSIS — R069 Unspecified abnormalities of breathing: Secondary | ICD-10-CM | POA: Diagnosis not present

## 2023-08-17 DIAGNOSIS — R918 Other nonspecific abnormal finding of lung field: Secondary | ICD-10-CM | POA: Diagnosis not present

## 2023-08-17 DIAGNOSIS — Z79890 Hormone replacement therapy (postmenopausal): Secondary | ICD-10-CM | POA: Diagnosis not present

## 2023-08-17 DIAGNOSIS — Z7985 Long-term (current) use of injectable non-insulin antidiabetic drugs: Secondary | ICD-10-CM | POA: Diagnosis not present

## 2023-08-17 DIAGNOSIS — R0600 Dyspnea, unspecified: Secondary | ICD-10-CM | POA: Diagnosis not present

## 2023-08-17 DIAGNOSIS — E8729 Other acidosis: Secondary | ICD-10-CM | POA: Diagnosis not present

## 2023-08-17 DIAGNOSIS — K5730 Diverticulosis of large intestine without perforation or abscess without bleeding: Secondary | ICD-10-CM | POA: Diagnosis not present

## 2023-08-17 DIAGNOSIS — I110 Hypertensive heart disease with heart failure: Secondary | ICD-10-CM | POA: Diagnosis not present

## 2023-08-17 DIAGNOSIS — I5032 Chronic diastolic (congestive) heart failure: Secondary | ICD-10-CM | POA: Diagnosis not present

## 2023-08-17 DIAGNOSIS — K429 Umbilical hernia without obstruction or gangrene: Secondary | ICD-10-CM | POA: Diagnosis not present

## 2023-08-17 DIAGNOSIS — Z7901 Long term (current) use of anticoagulants: Secondary | ICD-10-CM | POA: Diagnosis not present

## 2023-08-17 DIAGNOSIS — I080 Rheumatic disorders of both mitral and aortic valves: Secondary | ICD-10-CM | POA: Diagnosis not present

## 2023-08-17 DIAGNOSIS — R4189 Other symptoms and signs involving cognitive functions and awareness: Secondary | ICD-10-CM | POA: Diagnosis not present

## 2023-08-17 DIAGNOSIS — N289 Disorder of kidney and ureter, unspecified: Secondary | ICD-10-CM | POA: Diagnosis not present

## 2023-08-17 DIAGNOSIS — E1165 Type 2 diabetes mellitus with hyperglycemia: Secondary | ICD-10-CM | POA: Diagnosis not present

## 2023-08-17 DIAGNOSIS — I480 Paroxysmal atrial fibrillation: Secondary | ICD-10-CM | POA: Diagnosis not present

## 2023-08-17 DIAGNOSIS — J189 Pneumonia, unspecified organism: Secondary | ICD-10-CM | POA: Diagnosis not present

## 2023-08-17 DIAGNOSIS — J159 Unspecified bacterial pneumonia: Secondary | ICD-10-CM | POA: Diagnosis not present

## 2023-08-17 DIAGNOSIS — J90 Pleural effusion, not elsewhere classified: Secondary | ICD-10-CM | POA: Diagnosis not present

## 2023-08-17 DIAGNOSIS — Z79899 Other long term (current) drug therapy: Secondary | ICD-10-CM | POA: Diagnosis not present

## 2023-08-17 DIAGNOSIS — Z79891 Long term (current) use of opiate analgesic: Secondary | ICD-10-CM | POA: Diagnosis not present

## 2023-08-17 DIAGNOSIS — M109 Gout, unspecified: Secondary | ICD-10-CM | POA: Diagnosis not present

## 2023-08-17 DIAGNOSIS — R0989 Other specified symptoms and signs involving the circulatory and respiratory systems: Secondary | ICD-10-CM | POA: Diagnosis not present

## 2023-08-17 DIAGNOSIS — E1110 Type 2 diabetes mellitus with ketoacidosis without coma: Secondary | ICD-10-CM | POA: Diagnosis not present

## 2023-08-17 DIAGNOSIS — I130 Hypertensive heart and chronic kidney disease with heart failure and stage 1 through stage 4 chronic kidney disease, or unspecified chronic kidney disease: Secondary | ICD-10-CM | POA: Diagnosis not present

## 2023-08-17 DIAGNOSIS — Z888 Allergy status to other drugs, medicaments and biological substances status: Secondary | ICD-10-CM | POA: Diagnosis not present

## 2023-08-17 DIAGNOSIS — J9601 Acute respiratory failure with hypoxia: Secondary | ICD-10-CM | POA: Diagnosis not present

## 2023-08-17 DIAGNOSIS — J984 Other disorders of lung: Secondary | ICD-10-CM | POA: Diagnosis not present

## 2023-08-17 DIAGNOSIS — R6889 Other general symptoms and signs: Secondary | ICD-10-CM | POA: Diagnosis not present

## 2023-08-17 DIAGNOSIS — R0689 Other abnormalities of breathing: Secondary | ICD-10-CM | POA: Diagnosis not present

## 2023-08-17 DIAGNOSIS — R160 Hepatomegaly, not elsewhere classified: Secondary | ICD-10-CM | POA: Diagnosis not present

## 2023-08-17 DIAGNOSIS — I2510 Atherosclerotic heart disease of native coronary artery without angina pectoris: Secondary | ICD-10-CM | POA: Diagnosis not present

## 2023-08-17 DIAGNOSIS — J811 Chronic pulmonary edema: Secondary | ICD-10-CM | POA: Diagnosis not present

## 2023-08-17 DIAGNOSIS — Z794 Long term (current) use of insulin: Secondary | ICD-10-CM | POA: Diagnosis not present

## 2023-08-17 DIAGNOSIS — E039 Hypothyroidism, unspecified: Secondary | ICD-10-CM | POA: Diagnosis not present

## 2023-08-17 DIAGNOSIS — I708 Atherosclerosis of other arteries: Secondary | ICD-10-CM | POA: Diagnosis not present

## 2023-08-17 DIAGNOSIS — R0602 Shortness of breath: Secondary | ICD-10-CM | POA: Diagnosis not present

## 2023-08-17 DIAGNOSIS — E1122 Type 2 diabetes mellitus with diabetic chronic kidney disease: Secondary | ICD-10-CM | POA: Diagnosis not present

## 2023-08-17 DIAGNOSIS — Z6841 Body Mass Index (BMI) 40.0 and over, adult: Secondary | ICD-10-CM | POA: Diagnosis not present

## 2023-08-17 DIAGNOSIS — R1084 Generalized abdominal pain: Secondary | ICD-10-CM | POA: Diagnosis not present

## 2023-08-17 DIAGNOSIS — Z881 Allergy status to other antibiotic agents status: Secondary | ICD-10-CM | POA: Diagnosis not present

## 2023-08-17 DIAGNOSIS — G4733 Obstructive sleep apnea (adult) (pediatric): Secondary | ICD-10-CM | POA: Diagnosis not present

## 2023-08-17 DIAGNOSIS — N179 Acute kidney failure, unspecified: Secondary | ICD-10-CM | POA: Diagnosis not present

## 2023-08-17 DIAGNOSIS — Z1152 Encounter for screening for COVID-19: Secondary | ICD-10-CM | POA: Diagnosis not present

## 2023-08-17 DIAGNOSIS — I517 Cardiomegaly: Secondary | ICD-10-CM | POA: Diagnosis not present

## 2023-08-17 DIAGNOSIS — Z743 Need for continuous supervision: Secondary | ICD-10-CM | POA: Diagnosis not present

## 2023-08-17 DIAGNOSIS — I10 Essential (primary) hypertension: Secondary | ICD-10-CM | POA: Diagnosis not present

## 2023-08-17 DIAGNOSIS — N189 Chronic kidney disease, unspecified: Secondary | ICD-10-CM | POA: Diagnosis not present

## 2023-08-18 DIAGNOSIS — I080 Rheumatic disorders of both mitral and aortic valves: Secondary | ICD-10-CM | POA: Diagnosis not present

## 2023-08-18 DIAGNOSIS — J189 Pneumonia, unspecified organism: Secondary | ICD-10-CM | POA: Diagnosis not present

## 2023-08-18 DIAGNOSIS — G4733 Obstructive sleep apnea (adult) (pediatric): Secondary | ICD-10-CM | POA: Diagnosis not present

## 2023-08-18 DIAGNOSIS — J9601 Acute respiratory failure with hypoxia: Secondary | ICD-10-CM | POA: Diagnosis not present

## 2023-08-19 DIAGNOSIS — J9601 Acute respiratory failure with hypoxia: Secondary | ICD-10-CM | POA: Diagnosis not present

## 2023-08-19 DIAGNOSIS — G4733 Obstructive sleep apnea (adult) (pediatric): Secondary | ICD-10-CM | POA: Diagnosis not present

## 2023-08-19 DIAGNOSIS — J189 Pneumonia, unspecified organism: Secondary | ICD-10-CM | POA: Diagnosis not present

## 2023-08-20 DIAGNOSIS — G4733 Obstructive sleep apnea (adult) (pediatric): Secondary | ICD-10-CM | POA: Diagnosis not present

## 2023-08-20 DIAGNOSIS — N179 Acute kidney failure, unspecified: Secondary | ICD-10-CM | POA: Diagnosis not present

## 2023-08-20 DIAGNOSIS — E8729 Other acidosis: Secondary | ICD-10-CM | POA: Diagnosis not present

## 2023-08-20 DIAGNOSIS — R4189 Other symptoms and signs involving cognitive functions and awareness: Secondary | ICD-10-CM | POA: Diagnosis not present

## 2023-08-20 DIAGNOSIS — I5032 Chronic diastolic (congestive) heart failure: Secondary | ICD-10-CM | POA: Diagnosis not present

## 2023-08-20 DIAGNOSIS — I110 Hypertensive heart disease with heart failure: Secondary | ICD-10-CM | POA: Diagnosis not present

## 2023-08-20 DIAGNOSIS — E1165 Type 2 diabetes mellitus with hyperglycemia: Secondary | ICD-10-CM | POA: Diagnosis not present

## 2023-08-20 DIAGNOSIS — J189 Pneumonia, unspecified organism: Secondary | ICD-10-CM | POA: Diagnosis not present

## 2023-08-20 DIAGNOSIS — J9601 Acute respiratory failure with hypoxia: Secondary | ICD-10-CM | POA: Diagnosis not present

## 2023-08-20 DIAGNOSIS — J984 Other disorders of lung: Secondary | ICD-10-CM | POA: Diagnosis not present

## 2023-08-20 DIAGNOSIS — Z794 Long term (current) use of insulin: Secondary | ICD-10-CM | POA: Diagnosis not present

## 2023-08-20 DIAGNOSIS — I480 Paroxysmal atrial fibrillation: Secondary | ICD-10-CM | POA: Diagnosis not present

## 2023-08-21 DIAGNOSIS — J9601 Acute respiratory failure with hypoxia: Secondary | ICD-10-CM | POA: Diagnosis not present

## 2023-08-21 DIAGNOSIS — N179 Acute kidney failure, unspecified: Secondary | ICD-10-CM | POA: Diagnosis not present

## 2023-08-21 DIAGNOSIS — J984 Other disorders of lung: Secondary | ICD-10-CM | POA: Diagnosis not present

## 2023-08-21 DIAGNOSIS — E1165 Type 2 diabetes mellitus with hyperglycemia: Secondary | ICD-10-CM | POA: Diagnosis not present

## 2023-08-21 DIAGNOSIS — R4189 Other symptoms and signs involving cognitive functions and awareness: Secondary | ICD-10-CM | POA: Diagnosis not present

## 2023-08-21 DIAGNOSIS — I480 Paroxysmal atrial fibrillation: Secondary | ICD-10-CM | POA: Diagnosis not present

## 2023-08-21 DIAGNOSIS — I110 Hypertensive heart disease with heart failure: Secondary | ICD-10-CM | POA: Diagnosis not present

## 2023-08-21 DIAGNOSIS — J189 Pneumonia, unspecified organism: Secondary | ICD-10-CM | POA: Diagnosis not present

## 2023-08-21 DIAGNOSIS — G4733 Obstructive sleep apnea (adult) (pediatric): Secondary | ICD-10-CM | POA: Diagnosis not present

## 2023-08-21 DIAGNOSIS — E8729 Other acidosis: Secondary | ICD-10-CM | POA: Diagnosis not present

## 2023-08-21 DIAGNOSIS — Z794 Long term (current) use of insulin: Secondary | ICD-10-CM | POA: Diagnosis not present

## 2023-08-21 DIAGNOSIS — I5032 Chronic diastolic (congestive) heart failure: Secondary | ICD-10-CM | POA: Diagnosis not present

## 2023-08-22 DIAGNOSIS — E8729 Other acidosis: Secondary | ICD-10-CM | POA: Diagnosis not present

## 2023-08-22 DIAGNOSIS — E1165 Type 2 diabetes mellitus with hyperglycemia: Secondary | ICD-10-CM | POA: Diagnosis not present

## 2023-08-22 DIAGNOSIS — Z794 Long term (current) use of insulin: Secondary | ICD-10-CM | POA: Diagnosis not present

## 2023-08-22 DIAGNOSIS — I5032 Chronic diastolic (congestive) heart failure: Secondary | ICD-10-CM | POA: Diagnosis not present

## 2023-08-22 DIAGNOSIS — I110 Hypertensive heart disease with heart failure: Secondary | ICD-10-CM | POA: Diagnosis not present

## 2023-08-22 DIAGNOSIS — G4733 Obstructive sleep apnea (adult) (pediatric): Secondary | ICD-10-CM | POA: Diagnosis not present

## 2023-08-22 DIAGNOSIS — J984 Other disorders of lung: Secondary | ICD-10-CM | POA: Diagnosis not present

## 2023-08-22 DIAGNOSIS — J189 Pneumonia, unspecified organism: Secondary | ICD-10-CM | POA: Diagnosis not present

## 2023-08-22 DIAGNOSIS — N179 Acute kidney failure, unspecified: Secondary | ICD-10-CM | POA: Diagnosis not present

## 2023-08-22 DIAGNOSIS — I480 Paroxysmal atrial fibrillation: Secondary | ICD-10-CM | POA: Diagnosis not present

## 2023-08-22 DIAGNOSIS — R4189 Other symptoms and signs involving cognitive functions and awareness: Secondary | ICD-10-CM | POA: Diagnosis not present

## 2023-08-22 DIAGNOSIS — J9601 Acute respiratory failure with hypoxia: Secondary | ICD-10-CM | POA: Diagnosis not present

## 2023-08-23 DIAGNOSIS — J189 Pneumonia, unspecified organism: Secondary | ICD-10-CM | POA: Diagnosis not present

## 2023-08-24 DIAGNOSIS — J189 Pneumonia, unspecified organism: Secondary | ICD-10-CM | POA: Diagnosis not present

## 2023-08-25 DIAGNOSIS — J189 Pneumonia, unspecified organism: Secondary | ICD-10-CM | POA: Diagnosis not present

## 2023-08-26 DIAGNOSIS — E119 Type 2 diabetes mellitus without complications: Secondary | ICD-10-CM | POA: Diagnosis not present

## 2023-08-26 DIAGNOSIS — J9601 Acute respiratory failure with hypoxia: Secondary | ICD-10-CM | POA: Diagnosis not present

## 2023-08-26 DIAGNOSIS — J984 Other disorders of lung: Secondary | ICD-10-CM | POA: Diagnosis not present

## 2023-08-26 DIAGNOSIS — Z794 Long term (current) use of insulin: Secondary | ICD-10-CM | POA: Diagnosis not present

## 2023-08-26 DIAGNOSIS — E039 Hypothyroidism, unspecified: Secondary | ICD-10-CM | POA: Diagnosis not present

## 2023-08-26 DIAGNOSIS — N179 Acute kidney failure, unspecified: Secondary | ICD-10-CM | POA: Diagnosis not present

## 2023-08-26 DIAGNOSIS — I10 Essential (primary) hypertension: Secondary | ICD-10-CM | POA: Diagnosis not present

## 2023-08-26 DIAGNOSIS — J189 Pneumonia, unspecified organism: Secondary | ICD-10-CM | POA: Diagnosis not present

## 2023-08-26 DIAGNOSIS — G4733 Obstructive sleep apnea (adult) (pediatric): Secondary | ICD-10-CM | POA: Diagnosis not present

## 2023-08-26 DIAGNOSIS — I480 Paroxysmal atrial fibrillation: Secondary | ICD-10-CM | POA: Diagnosis not present

## 2023-08-27 ENCOUNTER — Ambulatory Visit (HOSPITAL_COMMUNITY): Payer: PPO

## 2023-08-27 ENCOUNTER — Telehealth: Payer: Self-pay

## 2023-08-27 DIAGNOSIS — Z794 Long term (current) use of insulin: Secondary | ICD-10-CM | POA: Diagnosis not present

## 2023-08-27 DIAGNOSIS — J9601 Acute respiratory failure with hypoxia: Secondary | ICD-10-CM | POA: Diagnosis not present

## 2023-08-27 DIAGNOSIS — E1165 Type 2 diabetes mellitus with hyperglycemia: Secondary | ICD-10-CM | POA: Diagnosis not present

## 2023-08-27 DIAGNOSIS — I110 Hypertensive heart disease with heart failure: Secondary | ICD-10-CM | POA: Diagnosis not present

## 2023-08-27 DIAGNOSIS — I5032 Chronic diastolic (congestive) heart failure: Secondary | ICD-10-CM | POA: Diagnosis not present

## 2023-08-27 DIAGNOSIS — Z7901 Long term (current) use of anticoagulants: Secondary | ICD-10-CM | POA: Diagnosis not present

## 2023-08-27 DIAGNOSIS — J984 Other disorders of lung: Secondary | ICD-10-CM | POA: Diagnosis not present

## 2023-08-27 DIAGNOSIS — J189 Pneumonia, unspecified organism: Secondary | ICD-10-CM | POA: Diagnosis not present

## 2023-08-27 DIAGNOSIS — G3184 Mild cognitive impairment, so stated: Secondary | ICD-10-CM | POA: Diagnosis not present

## 2023-08-27 DIAGNOSIS — I480 Paroxysmal atrial fibrillation: Secondary | ICD-10-CM | POA: Diagnosis not present

## 2023-08-27 NOTE — Telephone Encounter (Signed)
Patient was advised on medication which medications she is currently taking per her last office visit.   Take Humalog Mix 20 units with Breakfast and 20 units with Supper  -Increase  Mounjaro 7.5 mg weekly

## 2023-08-27 NOTE — Telephone Encounter (Signed)
Error

## 2023-08-28 ENCOUNTER — Telehealth: Payer: Self-pay

## 2023-08-28 NOTE — Transitions of Care (Post Inpatient/ED Visit) (Signed)
   08/28/2023  Name: Etienne Joss MRN: [id number] DOB: 14-Mar-1941  Today's TOC FU Call Status: Today's TOC FU Call Status:: Unsuccessful Call (1st Attempt) Unsuccessful Call (1st Attempt) Date: 08/28/23  Attempted to reach the patient regarding the most recent Inpatient/ED visit.  Follow Up Plan: Additional outreach attempts will be made to reach the patient to complete the Transitions of Care (Post Inpatient/ED visit) call.   Jodelle Gross RN, BSN, CCM RN Care Manager  Transitions of Care  [facility]  [phone]

## 2023-08-31 ENCOUNTER — Encounter (HOSPITAL_COMMUNITY): Payer: PPO | Admitting: Cardiology

## 2023-09-01 ENCOUNTER — Ambulatory Visit (INDEPENDENT_AMBULATORY_CARE_PROVIDER_SITE_OTHER): Payer: PPO

## 2023-09-01 ENCOUNTER — Encounter: Payer: Self-pay | Admitting: Emergency Medicine

## 2023-09-01 ENCOUNTER — Ambulatory Visit (INDEPENDENT_AMBULATORY_CARE_PROVIDER_SITE_OTHER): Payer: PPO | Admitting: Emergency Medicine

## 2023-09-01 VITALS — BP 124/64 | HR 64 | Temp 97.8°F | Ht 61.0 in | Wt 258.0 lb

## 2023-09-01 DIAGNOSIS — Z09 Encounter for follow-up examination after completed treatment for conditions other than malignant neoplasm: Secondary | ICD-10-CM

## 2023-09-01 DIAGNOSIS — Z7901 Long term (current) use of anticoagulants: Secondary | ICD-10-CM | POA: Diagnosis not present

## 2023-09-01 DIAGNOSIS — I5042 Chronic combined systolic (congestive) and diastolic (congestive) heart failure: Secondary | ICD-10-CM

## 2023-09-01 DIAGNOSIS — E785 Hyperlipidemia, unspecified: Secondary | ICD-10-CM

## 2023-09-01 DIAGNOSIS — I152 Hypertension secondary to endocrine disorders: Secondary | ICD-10-CM

## 2023-09-01 DIAGNOSIS — M109 Gout, unspecified: Secondary | ICD-10-CM

## 2023-09-01 DIAGNOSIS — J189 Pneumonia, unspecified organism: Secondary | ICD-10-CM

## 2023-09-01 DIAGNOSIS — E1169 Type 2 diabetes mellitus with other specified complication: Secondary | ICD-10-CM

## 2023-09-01 DIAGNOSIS — E039 Hypothyroidism, unspecified: Secondary | ICD-10-CM | POA: Diagnosis not present

## 2023-09-01 DIAGNOSIS — E1159 Type 2 diabetes mellitus with other circulatory complications: Secondary | ICD-10-CM | POA: Diagnosis not present

## 2023-09-01 DIAGNOSIS — I480 Paroxysmal atrial fibrillation: Secondary | ICD-10-CM | POA: Diagnosis not present

## 2023-09-01 DIAGNOSIS — N1832 Chronic kidney disease, stage 3b: Secondary | ICD-10-CM

## 2023-09-01 DIAGNOSIS — I700 Atherosclerosis of aorta: Secondary | ICD-10-CM

## 2023-09-01 DIAGNOSIS — I517 Cardiomegaly: Secondary | ICD-10-CM | POA: Diagnosis not present

## 2023-09-01 MED ORDER — CLONIDINE HCL 0.3 MG PO TABS: 0.3000 mg | ORAL_TABLET | Freq: Two times a day (BID) | ORAL | 2 refills | Status: DC

## 2023-09-01 MED ORDER — ALLOPURINOL 300 MG PO TABS: ORAL_TABLET | ORAL | 2 refills | Status: DC

## 2023-09-01 NOTE — Assessment & Plan Note (Signed)
Continues to be monitored by nephrology service Advised to stay well-hydrated and avoid NSAIDs

## 2023-09-01 NOTE — Assessment & Plan Note (Signed)
Clinically stable Continues levothyroxine 112 mcg daily

## 2023-09-01 NOTE — Assessment & Plan Note (Signed)
Sinus rhythm with good ventricular response today Continues anticoagulation with Eliquis 5 mg twice a day No significant clinical bleeding episodes. Continues Dronedarone 400 mg twice a day Continues metoprolol tartrate 25 mg twice a d

## 2023-09-01 NOTE — Assessment & Plan Note (Signed)
No signs of acute CHF. No peripheral edema Continues Demadex 20 mg daily Hypertension well-controlled Continues losartan 50 mg and metoprolol tartrate 25 mg twice a day

## 2023-09-01 NOTE — Patient Instructions (Signed)
Health Maintenance After Age 80 After age 80, you are at a higher risk for certain long-term diseases and infections as well as injuries from falls. Falls are a major cause of broken bones and head injuries in people who are older than age 80. Getting regular preventive care can help to keep you healthy and well. Preventive care includes getting regular testing and making lifestyle changes as recommended by your health care provider. Talk with your health care provider about: Which screenings and tests you should have. A screening is a test that checks for a disease when you have no symptoms. A diet and exercise plan that is right for you. What should I know about screenings and tests to prevent falls? Screening and testing are the best ways to find a health problem early. Early diagnosis and treatment give you the best chance of managing medical conditions that are common after age 80. Certain conditions and lifestyle choices may make you more likely to have a fall. Your health care provider may recommend: Regular vision checks. Poor vision and conditions such as cataracts can make you more likely to have a fall. If you wear glasses, make sure to get your prescription updated if your vision changes. Medicine review. Work with your health care provider to regularly review all of the medicines you are taking, including over-the-counter medicines. Ask your health care provider about any side effects that may make you more likely to have a fall. Tell your health care provider if any medicines that you take make you feel dizzy or sleepy. Strength and balance checks. Your health care provider may recommend certain tests to check your strength and balance while standing, walking, or changing positions. Foot health exam. Foot pain and numbness, as well as not wearing proper footwear, can make you more likely to have a fall. Screenings, including: Osteoporosis screening. Osteoporosis is a condition that causes  the bones to get weaker and break more easily. Blood pressure screening. Blood pressure changes and medicines to control blood pressure can make you feel dizzy. Depression screening. You may be more likely to have a fall if you have a fear of falling, feel depressed, or feel unable to do activities that you used to do. Alcohol use screening. Using too much alcohol can affect your balance and may make you more likely to have a fall. Follow these instructions at home: Lifestyle Do not drink alcohol if: Your health care provider tells you not to drink. If you drink alcohol: Limit how much you have to: 0-1 drink a day for women. 0-2 drinks a day for men. Know how much alcohol is in your drink. In the [place] one drink equals one 12 oz bottle of beer (355 mL), one 5 oz glass of wine (148 mL), or one 1 oz glass of hard liquor (44 mL). Do not use any products that contain nicotine or tobacco. These products include cigarettes, chewing tobacco, and vaping devices, such as e-cigarettes. If you need help quitting, ask your health care provider. Activity  Follow a regular exercise program to stay fit. This will help you maintain your balance. Ask your health care provider what types of exercise are appropriate for you. If you need a cane or walker, use it as recommended by your health care provider. Wear supportive shoes that have nonskid soles. Safety  Remove any tripping hazards, such as rugs, cords, and clutter. Install safety equipment such as grab bars in bathrooms and safety rails on stairs. Keep rooms and walkways   well-lit. General instructions Talk with your health care provider about your risks for falling. Tell your health care provider if: You fall. Be sure to tell your health care provider about all falls, even ones that seem minor. You feel dizzy, tiredness (fatigue), or off-balance. Take over-the-counter and prescription medicines only as told by your health care provider. These include  supplements. Eat a healthy diet and maintain a healthy weight. A healthy diet includes low-fat dairy products, low-fat (lean) meats, and fiber from whole grains, beans, and lots of fruits and vegetables. Stay current with your vaccines. Schedule regular health, dental, and eye exams. Summary Having a healthy lifestyle and getting preventive care can help to protect your health and wellness after age 80. Screening and testing are the best way to find a health problem early and help you avoid having a fall. Early diagnosis and treatment give you the best chance for managing medical conditions that are more common for people who are older than age 80. Falls are a major cause of broken bones and head injuries in people who are older than age 80. Take precautions to prevent a fall at home. Work with your health care provider to learn what changes you can make to improve your health and wellness and to prevent falls. This information is not intended to replace advice given to you by your health care provider. Make sure you discuss any questions you have with your health care provider. Document Revised: 02/25/2021 Document Reviewed: 02/25/2021 Elsevier Patient Education  2024 [employer]vier Inc.  

## 2023-09-01 NOTE — Assessment & Plan Note (Signed)
Chronic stable conditions On insulin pump Continue rosuvastatin 10 mg daily

## 2023-09-01 NOTE — Assessment & Plan Note (Signed)
Much improved and asymptomatic Finished course of antibiotic No concerns Chest x-ray repeated today.  Report reviewed. Advised to continue resting and stay well-hydrated

## 2023-09-01 NOTE — Progress Notes (Signed)
[name] 82 y.o.   Chief Complaint  Patient presents with   Hospitalization Follow-up    Patient was in the hosp with pneumonia, patient states since being home she has been ok.     HISTORY OF PRESENT ILLNESS: This is a 82 y.o. female here for hospital discharge follow-up Recently admitted with community-acquired pneumonia.  Length of stay 11 days. Finished course of antibiotic.  Feeling much better. Has no complaints or any other medical concerns today.  HPI   Prior to Admission medications   Medication Sig Start Date End Date Taking? Authorizing Provider  Accu-Chek Softclix Lancets lancets 1 each by Other route 3 (three) times daily. as directed 03/30/20  Yes Janeece Agee, NP  amLODipine (NORVASC) 5 MG tablet  06/03/23  Yes [provider]  azelastine (ASTELIN) 0.1 % nasal spray 1-2 puffs each nostril at bedtime as needed 01/02/22  Yes Young, Joni Fears D, MD  b complex vitamins capsule Take 1 capsule by mouth daily.   Yes [provider]  BD PEN NEEDLE NANO 2ND GEN 32G X 4 MM MISC USE TWICE DAILY AS NEEDED 08/02/22  Yes Calistro Rauf, Eilleen Kempf, MD  blood glucose meter kit and supplies Dispense based on patient and insurance preference. Use up to four times daily as directed. (FOR ICD-10 E10.9, E11.9). 08/29/21  Yes Tymeer Vaquera, Eilleen Kempf, MD  cholecalciferol (VITAMIN D3) 25 MCG (1000 UNIT) tablet Take 1,000 Units by mouth daily.   Yes [provider]  clobetasol cream (TEMOVATE) 0.05 % Apply 1 Application topically 2 (two) times daily. 06/05/22  Yes Atonya Templer, Eilleen Kempf, MD  Continuous Blood Gluc Receiver (FREESTYLE LIBRE 2 READER) DEVI Use to check blood glucose. Use as directed 01/08/23  Yes Monte Zinni, Eilleen Kempf, MD  Continuous Glucose Sensor (FREESTYLE LIBRE 2 SENSOR) MISC USE AS DIRECTED TO CHECK BLOOD  SUGAR DAILY 08/05/23  Yes [name], Konrad Dolores, MD  dronedarone (MULTAQ) 400 MG tablet Take 1 tablet (400 mg total) by mouth 2 (two) times  daily. Please keep scheduled appointment for future refills. Thank you. 08/07/23  Yes Duke Salvia, MD  ELIQUIS 5 MG TABS tablet TAKE 1 TABLET BY MOUTH TWICE  DAILY 04/28/23  Yes Duke Salvia, MD  Glucagon (GVOKE HYPOPEN 2-PACK) 0.5 MG/0.1ML SOAJ Inject 0.5 mg into the skin daily as needed. For Hypoglycemic events 05/30/22  Yes Glendon Dunwoody, Eilleen Kempf, MD  hydrocortisone 2.5 % cream APPLY TOPICALLY 2 TIMES DAILY AS NEEDED FOR RASH 08/10/23  Yes Terri Piedra, DO  hydrOXYzine (ATARAX) 10 MG tablet Take 1 tablet (10 mg total) by mouth every 6 (six) hours as needed. 08/12/22  Yes Makih Stefanko, Eilleen Kempf, MD  hydrOXYzine (VISTARIL) 25 MG capsule Take 1 capsule (25 mg total) by mouth every 8 (eight) hours as needed. 01/15/23  Yes Corwin Levins, MD  Insulin Lispro Prot & Lispro (HUMALOG MIX 75/25 KWIKPEN) (75-25) 100 UNIT/ML Kwikpen Inject 20 Units into the skin 2 (two) times daily before a meal. 07/07/23  Yes [name], Konrad Dolores, MD  ketorolac (ACULAR) 0.5 % ophthalmic solution SMARTSIG:In Eye(s) 08/22/21  Yes [provider]  levothyroxine (SYNTHROID) 112 MCG tablet Take 1 tablet (112 mcg total) by mouth daily. 04/09/23  Yes Aurelia Gras, Eilleen Kempf, MD  lidocaine (LIDODERM) 5 % Place 1 patch onto the skin daily. Remove & Discard patch within 12 hours or as directed by MD 08/29/20  Yes Kathryne Hitch, MD  losartan (COZAAR) 50 MG tablet Take 1 tablet (50 mg total) by mouth daily. 07/07/23  Yes [name], Konrad Dolores, MD  methocarbamol (ROBAXIN) 500 MG tablet Take 1 tablet (500 mg total) by mouth every 8 (eight) hours as needed. 08/10/23  Yes [name] F, FNP  metoprolol tartrate (LOPRESSOR) 25 MG tablet Take 1 tablet (25 mg total) by mouth 2 (two) times daily. 10/29/22 10/24/23 Yes Sera Hitsman, Eilleen Kempf, MD  nystatin powder Apply 1 Application topically 2 (two) times daily. 08/12/22  Yes Jaxden Blyden, Eilleen Kempf, MD  nystatin-triamcinolone ointment [facility]) For up to 1 week as  needed for rash them discontinue. May restart as needed for flares. Avoid applying to face, groin, and axilla. Use as directed. Long-term use can cause thinning of the skin. 04/01/23  Yes Terri Piedra, DO  [facility] VERIO test strip 1 each by Other route 3 (three) times daily. 06/08/20  Yes [provider]  RESTASIS MULTIDOSE 0.05 % ophthalmic emulsion Place 1 drop into both eyes 2 (two) times daily. 03/04/20  Yes [provider]  rosuvastatin (CRESTOR) 10 MG tablet TAKE 1 TABLET BY MOUTH IN THE  EVENING 02/11/23  Yes Lidia Clavijo, Eilleen Kempf, MD  spironolactone (ALDACTONE) 25 MG tablet Take 1 tablet (25 mg total) by mouth daily. 07/08/23  Yes [name], Konrad Dolores, MD  tirzepatide [facility]) 7.5 MG/0.5ML Pen Inject 7.5 mg into the skin once a week. 07/07/23  Yes [name], Konrad Dolores, MD  TOBRADEX ophthalmic ointment  08/25/21  Yes [provider]  torsemide (DEMADEX) 20 MG tablet Take 1 tablet (20 mg total) by mouth daily. Please keep scheduled appointment for future refills. Thank you. 08/07/23  Yes Duke Salvia, MD  triamcinolone cream (KENALOG) 0.1 % Apply 1 application topically 2 (two) times daily. 07/26/20  Yes Alfonso Carden, Eilleen Kempf, MD  VELTASSA 8.4 g packet Take 1 packet by mouth daily. 04/17/23  Yes [provider]  vitamin B-12 (CYANOCOBALAMIN) 1000 MCG tablet Take 1,000 mcg by mouth daily.   Yes [provider]  allopurinol (ZYLOPRIM) 300 MG tablet TAKE 1 TABLET BY MOUTH DAILY 09/01/23   Georgina Quint, MD  cloNIDine (CATAPRES) 0.3 MG tablet Take 1 tablet (0.3 mg total) by mouth 2 (two) times daily. 09/01/23   Georgina Quint, MD  ipratropium (ATROVENT HFA) 17 MCG/ACT inhaler Inhale 2 puffs into the lungs every 6 (six) hours as needed for wheezing. 12/17/21 08/10/23  Waymon Budge, MD  potassium chloride (KLOR-CON) 10 MEQ tablet Take 4 tablets (40 mEq total) by mouth daily. 12/10/21 03/06/22  Graciella Freer, PA-C     Allergies  Allergen Reactions   Doxycycline     Made patient feel very ill    Metformin Diarrhea    Patient Active Problem List   Diagnosis Date Noted   Right foot pain 07/28/2023   Hyperkalemia, diminished renal excretion 05/27/2023   Allergic rhinitis 01/15/2023   Stage 3b chronic kidney disease (HCC) 01/07/2023   Primary hyperaldosteronism (HCC) 03/06/2022   Coronary artery disease involving native coronary artery of native heart without angina pectoris 02/12/2022   Adrenal adenoma, right 02/07/2022   Skin lesion of right arm 02/02/2022   Asthma 11/01/2021   Normocytic anemia 10/11/2021   Nodule of right lung 09/30/2021   Dysthymia 03/12/2021   Primary osteoarthritis involving multiple joints 03/01/2021   Recurrent falls 01/10/2021   Sensorineural hearing loss (SNHL), 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   Persistent atrial fibrillation (HCC) 08/12/2020   Type 2 diabetes mellitus with hyperglycemia, with long-term current use of insulin (HCC) 06/21/2020   Dyslipidemia associated with type 2 diabetes mellitus (HCC)  06/21/2020   Mixed hyperlipidemia 06/21/2020   Uncontrolled hypertension 06/11/2020   Aortic atherosclerosis (HCC) 03/28/2020   Diabetic peripheral neuropathy (HCC) 12/26/2019   Gastroesophageal reflux disease without esophagitis 03/17/2019   Varicose veins of both legs with edema 02/16/2019   Heart murmur 09/05/2018   Restrictive lung disease 02/22/2018   Dysgeusia 06/11/2017   Osteoarthritis of left knee 06/03/2017   NAFLD (nonalcoholic fatty liver disease) 65/78/4696   Osteoporosis 12/31/2015   Obesity 12/19/2015   Acquired hypothyroidism 10/18/2015   Prolonged QT interval 10/03/2013   CHF (congestive heart failure) (HCC) 04/21/2013   Long term (current) use of anticoagulants 06/09/2012   BENIGN NEOPLASM OF ADRENAL GLAND 11/25/2010   Chronic diastolic heart failure (HCC) 02/20/2010   Diabetes (HCC) 05/16/2009   GOUT 05/16/2009   OBESITY, MORBID 05/16/2009    Hypertension associated with diabetes (HCC) 05/16/2009   Paroxysmal atrial fibrillation (HCC) 05/16/2009   HYPERLIPIDEMIA 11/30/2008   Obstructive sleep apnea on CPAP 11/30/2008    Past Medical History:  Diagnosis Date   Arthritis    Back pain    BCC (basal cell carcinoma) 04/01/2023   right lower chin, Mohs completed on 04/21/23   Chronic anticoagulation    due to aflutter   Chronic kidney disease    Diabetes mellitus    Diastolic CHF, chronic (HCC)    a.  echo 2006 - ef 55-65%; mild diast dysfxn;    b. Echo 08/2011: Mild LVH, EF 60%;  c. 04/2013 Echo: EF 65-69%, mild conc LVH;  08/2014 Echo: EF 60-65%, mild-mod MR.   Gout    Hyperlipidemia    Hypertension    a.  Renal arterial Dopplers 12/2011: 1-59% right renal artery stenosis   Morbid obesity (HCC)    Obstructive sleep apnea on CPAP    Paroxysmal Afib/Flutter    a. 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    Past Surgical History:  Procedure Laterality Date   APPENDECTOMY     ATRIAL FLUTTER ABLATION N/A 09/24/2011   Procedure: ATRIAL FLUTTER ABLATION;  Surgeon: Marinus Maw, MD;  Location[facility] CATH LAB;  Service: Cardiovascular;  Laterality: N/A;   CARDIOVERSION  10/22/2011   Procedure: CARDIOVERSION;  Surgeon: Duke Salvia, MD;  Location: [facility] OR;  Service: Cardiovascular;  Laterality: N/A;   CARDIOVERSION N/A 09/10/2011   Procedure: CARDIOVERSION;  Surgeon: Duke Salvia, MD;  Location: [facility] CATH LAB;  Service: Cardiovascular;  Laterality: N/A;   CHOLECYSTECTOMY     COLONOSCOPY WITH PROPOFOL N/A 06/13/2021   Procedure: COLONOSCOPY WITH PROPOFOL;  Surgeon: Rachael Fee, MD;  Location: [facility] ENDOSCOPY;  Service: Endoscopy;  Laterality: N/A;   POLYPECTOMY  06/13/2021   Procedure: POLYPECTOMY;  Surgeon: Rachael Fee, MD;  Location: [facility] ENDOSCOPY;  Service: Endoscopy;;   TONSILLECTOMY  1982   TOTAL ABDOMINAL HYSTERECTOMY      Social History   Socioeconomic History   Marital status: Married    Spouse name: Not on file    Number of children: 3   Years of education: Not on file   Highest education level: Not on file  Occupational History   Occupation: DISABILITY/housewife    Employer: RETIRED  Tobacco Use   Smoking status: Never   Smokeless tobacco: Never  Vaping Use   Vaping status: Never Used  Substance and Sexual Activity   Alcohol use: No   Drug use: No   Sexual activity: Yes  Other Topics Concern   Not on file  Social History Narrative   Not on file  Social Determinants of Health   Financial Resource Strain: Low Risk  (11/21/2022)   Overall Financial Resource Strain (CARDIA)    Difficulty of Paying Living Expenses: Not hard at all  Food Insecurity: No Food Insecurity (08/26/2023)   Received from [facility]   Hunger Vital Sign    Worried About Running Out of Food in the Last Year: Never true    Ran Out of Food in the Last Year: Never true  Transportation Needs: No Transportation Needs (08/26/2023)   Received from [facility] - Transportation    Lack of Transportation (Medical): No    Lack of Transportation (Non-Medical): No  Physical Activity: Inactive (11/21/2022)   Exercise Vital Sign    Days of Exercise per Week: 0 days    Minutes of Exercise per Session: 0 min  Stress: No Stress Concern Present (08/26/2023)   Received from [facility] - Occupational Stress Questionnaire    Feeling of Stress : Not at all  Social Connections: Socially Integrated (11/21/2022)   Social Connection and Isolation Panel [NHANES]    Frequency of Communication with Friends and Family: More than three times a week    Frequency of Social Gatherings with Friends and Family: Never    Attends Religious Services: More than 4 times per year    Active Member of [employer] or Organizations: Yes    Attends Engineer, structural: More than 4 times per year    Marital Status: Married  Catering manager Violence: Not At Risk (08/17/2023)   Received from [facility]   HITS    Over the last 12 months how often did your partner physically hurt you?: Never    Over the last 12 months how often did your partner insult you or talk down to you?: Never    Over the last 12 months how often did your partner threaten you with physical harm?: Never    Over the last 12 months how often did your partner scream or curse at you?: Never    Family History  Problem Relation Age of Onset   Heart disease Father    Hypertension Father    Breast cancer Sister    Cancer Sister        breast   Colon cancer Neg Hx    Esophageal cancer Neg Hx    Pancreatic cancer Neg Hx    Liver disease Neg Hx      Review of Systems  Constitutional: Negative.  Negative for chills and fever.  HENT: Negative.  Negative for congestion and sore throat.   Respiratory: Negative.  Negative for cough and shortness of breath.   Cardiovascular: Negative.  Negative for chest pain and palpitations.  Gastrointestinal:  Negative for abdominal pain, diarrhea, nausea and vomiting.  Genitourinary: Negative.  Negative for dysuria and hematuria.  Skin:  Negative for rash.  Neurological: Negative.  Negative for dizziness and headaches.  All other systems reviewed and are negative.   Vitals:   09/01/23 1108  BP: 124/64  Pulse: 64  Temp: 97.8 F (36.6 C)  SpO2: 99%    Physical Exam Vitals reviewed.  Constitutional:      Appearance: Normal appearance.  HENT:     Head: Normocephalic.  Eyes:     Extraocular Movements: Extraocular movements intact.  Cardiovascular:     Rate and Rhythm: Normal rate and regular rhythm.     Pulses: Normal pulses.     Heart sounds: Normal  heart sounds.  Pulmonary:     Effort: Pulmonary effort is normal.     Breath sounds: Normal breath sounds.  Musculoskeletal:     Cervical back: No tenderness.     Right lower leg: No edema.     Left lower leg: No edema.  Lymphadenopathy:     Cervical: No cervical adenopathy.  Skin:    General: Skin is warm and  dry.     Capillary Refill: Capillary refill takes less than 2 seconds.  Neurological:     Mental Status: She is alert and oriented to person, place, and time.  Psychiatric:        Mood and Affect: Mood normal.        Behavior: Behavior normal.      ASSESSMENT & PLAN: A total of 46 minutes was spent with the patient and counseling/coordination of care regarding preparing for this visit, review of most recent office visit notes, review of most recent hospital discharge notes, review of multiple chronic medical conditions under management, review of all medications, review of most recent blood work results, education on nutrition, prognosis, documentation, and need for follow-up.  Problem List Items Addressed This Visit       Cardiovascular and Mediastinum   Hypertension associated with diabetes (HCC)    BP Readings from Last 3 Encounters:  09/01/23 124/64  08/10/23 (!) 122/52  07/28/23 132/86  Well-controlled hypertension Continues Aldactone 25 mg daily, losartan 50 mg daily and metoprolol tartrate 25 mg twice a day Well-controlled diabetes on insulin pump Sees endocrinologist on a regular basis         Relevant Medications   cloNIDine (CATAPRES) 0.3 MG tablet   Paroxysmal atrial fibrillation (HCC)    Sinus rhythm with good ventricular response today Continues anticoagulation with Eliquis 5 mg twice a day No significant clinical bleeding episodes. Continues Dronedarone 400 mg twice a day Continues metoprolol tartrate 25 mg twice a d      Relevant Medications   cloNIDine (CATAPRES) 0.3 MG tablet   CHF (congestive heart failure) (HCC)    No signs of acute CHF. No peripheral edema Continues Demadex 20 mg daily Hypertension well-controlled Continues losartan 50 mg and metoprolol tartrate 25 mg twice a day      Relevant Medications   cloNIDine (CATAPRES) 0.3 MG tablet   Aortic atherosclerosis (HCC)    Chronic stable condition Diet and nutrition discussed Continue  rosuvastatin 10 mg daily      Relevant Medications   cloNIDine (CATAPRES) 0.3 MG tablet     Respiratory   Community acquired pneumonia - Primary    Much improved and asymptomatic Finished course of antibiotic No concerns Chest x-ray repeated today.  Report reviewed. Advised to continue resting and stay well-hydrated      Relevant Orders   DG Chest 2 View     Endocrine   Dyslipidemia associated with type 2 diabetes mellitus (HCC)    Chronic stable conditions On insulin pump Continue rosuvastatin 10 mg daily      Acquired hypothyroidism    Clinically stable Continues levothyroxine 112 mcg daily        Genitourinary   Stage 3b chronic kidney disease (HCC)    Continues to be monitored by nephrology service Advised to stay well-hydrated and avoid NSAIDs        Other   GOUT   Relevant Medications   allopurinol (ZYLOPRIM) 300 MG tablet   Long term (current) use of anticoagulants    Fall precautions given.  Other Visit Diagnoses     Hospital discharge follow-up          Patient Instructions  Health Maintenance After Age 68 After age 83, you are at a higher risk for certain long-term diseases and infections as well as injuries from falls. Falls are a major cause of broken bones and head injuries in people who are older than age 19. Getting regular preventive care can help to keep you healthy and well. Preventive care includes getting regular testing and making lifestyle changes as recommended by your health care provider. Talk with your health care provider about: Which screenings and tests you should have. A screening is a test that checks for a disease when you have no symptoms. A diet and exercise plan that is right for you. What should I know about screenings and tests to prevent falls? Screening and testing are the best ways to find a health problem early. Early diagnosis and treatment give you the best chance of managing medical conditions that are common  after age 70. Certain conditions and lifestyle choices may make you more likely to have a fall. Your health care provider may recommend: Regular vision checks. Poor vision and conditions such as cataracts can make you more likely to have a fall. If you wear glasses, make sure to get your prescription updated if your vision changes. Medicine review. Work with your health care provider to regularly review all of the medicines you are taking, including over-the-counter medicines. Ask your health care provider about any side effects that may make you more likely to have a fall. Tell your health care provider if any medicines that you take make you feel dizzy or sleepy. Strength and balance checks. Your health care provider may recommend certain tests to check your strength and balance while standing, walking, or changing positions. Foot health exam. Foot pain and numbness, as well as not wearing proper footwear, can make you more likely to have a fall. Screenings, including: Osteoporosis screening. Osteoporosis is a condition that causes the bones to get weaker and break more easily. Blood pressure screening. Blood pressure changes and medicines to control blood pressure can make you feel dizzy. Depression screening. You may be more likely to have a fall if you have a fear of falling, feel depressed, or feel unable to do activities that you used to do. Alcohol use screening. Using too much alcohol can affect your balance and may make you more likely to have a fall. Follow these instructions at home: Lifestyle Do not drink alcohol if: Your health care provider tells you not to drink. If you drink alcohol: Limit how much you have to: 0-1 drink a day for women. 0-2 drinks a day for men. Know how much alcohol is in your drink. In the [place] one drink equals one 12 oz bottle of beer (355 mL), one 5 oz glass of wine (148 mL), or one 1 oz glass of hard liquor (44 mL). Do not use any products that contain  nicotine or tobacco. These products include cigarettes, chewing tobacco, and vaping devices, such as e-cigarettes. If you need help quitting, ask your health care provider. Activity  Follow a regular exercise program to stay fit. This will help you maintain your balance. Ask your health care provider what types of exercise are appropriate for you. If you need a cane or walker, use it as recommended by your health care provider. Wear supportive shoes that have nonskid soles. Safety  Remove any tripping hazards, such  as rugs, cords, and clutter. Install safety equipment such as grab bars in bathrooms and safety rails on stairs. Keep rooms and walkways well-lit. General instructions Talk with your health care provider about your risks for falling. Tell your health care provider if: You fall. Be sure to tell your health care provider about all falls, even ones that seem minor. You feel dizzy, tiredness (fatigue), or off-balance. Take over-the-counter and prescription medicines only as told by your health care provider. These include supplements. Eat a healthy diet and maintain a healthy weight. A healthy diet includes low-fat dairy products, low-fat (lean) meats, and fiber from whole grains, beans, and lots of fruits and vegetables. Stay current with your vaccines. Schedule regular health, dental, and eye exams. Summary Having a healthy lifestyle and getting preventive care can help to protect your health and wellness after age 40. Screening and testing are the best way to find a health problem early and help you avoid having a fall. Early diagnosis and treatment give you the best chance for managing medical conditions that are more common for people who are older than age 57. Falls are a major cause of broken bones and head injuries in people who are older than age 34. Take precautions to prevent a fall at home. Work with your health care provider to learn what changes you can make to improve  your health and wellness and to prevent falls. This information is not intended to replace advice given to you by your health care provider. Make sure you discuss any questions you have with your health care provider. Document Revised: 02/25/2021 Document Reviewed: 02/25/2021 Elsevier Patient Education  2024 Elsevier Inc.      Edwina Barth, MD [facility]

## 2023-09-01 NOTE — Assessment & Plan Note (Signed)
BP Readings from Last 3 Encounters:  09/01/23 124/64  08/10/23 (!) 122/52  07/28/23 132/86  Well-controlled hypertension Continues Aldactone 25 mg daily, losartan 50 mg daily and metoprolol tartrate 25 mg twice a day Well-controlled diabetes on insulin pump Sees endocrinologist on a regular basis

## 2023-09-01 NOTE — Assessment & Plan Note (Signed)
Chronic stable condition Diet and nutrition discussed Continue rosuvastatin 10 mg daily

## 2023-09-01 NOTE — Assessment & Plan Note (Signed)
Fall precautions given.

## 2023-09-03 ENCOUNTER — Telehealth (HOSPITAL_COMMUNITY): Payer: Self-pay | Admitting: Licensed Clinical Social Worker

## 2023-09-03 ENCOUNTER — Encounter (HOSPITAL_COMMUNITY): Payer: PPO | Admitting: Cardiology

## 2023-09-03 NOTE — Telephone Encounter (Signed)
H&V Care Navigation CSW Progress Note  Clinical Social Worker consulted due to patients two missed appts this week- one cancellation and one no show.   CSW called pt to discuss.  She reports that following her recent hospital stay she continues to be too weak to drive herself safely which lead to her missing her appts.  CSW discussed if she had family and friends that could help her and she reports that she does have people from her church she could call to bring her to an appt.  She is agreeable to rescheduling- CSW sent message to scheduler to reach out.  SDOH Screenings   Food Insecurity: No Food Insecurity (08/26/2023)   Received from [facility]  Housing: Low Risk  (11/21/2022)  Transportation Needs: No Transportation Needs (08/26/2023)   Received from [facility]  Utilities: Not At Risk (08/26/2023)   Received from [facility]  Alcohol Screen: Low Risk  (11/21/2022)  Depression (PHQ2-9): Low Risk  (09/01/2023)  Financial Resource Strain: Low Risk  (11/21/2022)  Physical Activity: Inactive (11/21/2022)  Social Connections: Socially Integrated (11/21/2022)  Stress: No Stress Concern Present (08/26/2023)   Received from [facility]  Tobacco Use: Low Risk  (09/01/2023)    Burna Sis, LCSW Clinical Social Worker [facility] Desk#: [phone] Cell#: [phone]

## 2023-09-08 ENCOUNTER — Other Ambulatory Visit (HOSPITAL_COMMUNITY): Payer: Self-pay

## 2023-09-08 ENCOUNTER — Telehealth: Payer: Self-pay

## 2023-09-08 NOTE — Telephone Encounter (Signed)
Mounjaro needs PA. Patient hasn't had medication in 2 weeks.

## 2023-09-08 NOTE — Telephone Encounter (Signed)
Patient aware.

## 2023-09-08 NOTE — Telephone Encounter (Signed)
Pharmacy Patient Advocate Encounter   Received notification from Pt Calls Messages that prior authorization for Greggory Keen is required/requested.   Insurance verification completed.   The patient is insured through [facility] ADVANTAGE/RX ADVANCE .   Per test claim: PA required; PA submitted to above mentioned insurance via CoverMyMeds Key/confirmation #/EOC [id number] Status is pending   PA has been APPROVED through 09/07/2024 PA Case ID #: [id number]

## 2023-09-10 ENCOUNTER — Other Ambulatory Visit: Payer: PPO

## 2023-09-14 ENCOUNTER — Telehealth: Payer: Self-pay | Admitting: Internal Medicine

## 2023-09-14 NOTE — Telephone Encounter (Signed)
New Message:       Belva Chimes is calling to see if the patient needs the Echo scheduled for December? She said patient had an Echo already in October.

## 2023-09-15 ENCOUNTER — Telehealth: Payer: Self-pay | Admitting: Emergency Medicine

## 2023-09-15 ENCOUNTER — Other Ambulatory Visit: Payer: PPO

## 2023-09-15 NOTE — Telephone Encounter (Signed)
Sorry about her loss.  She should be okay to travel.  Thanks.

## 2023-09-15 NOTE — Telephone Encounter (Signed)
Patient said her son passed and is in [place]. She wanted to know if Dr. Alvy Bimler thinks she would be okay to travel. Patient would like a call back at [phone].

## 2023-09-15 NOTE — Telephone Encounter (Signed)
Debbie from [facility] called to move patient's discharge date from nursing to 10-18-23. Patient's son passed away and she is not up to seeing them this week. Best callback is [phone]).

## 2023-09-15 NOTE — Telephone Encounter (Signed)
Per Dr Graciela Husbands echo may be canceled.  He has reviewed echo completed with [facility] from 07/2023.

## 2023-09-16 ENCOUNTER — Other Ambulatory Visit (HOSPITAL_COMMUNITY): Payer: PPO

## 2023-09-16 NOTE — Telephone Encounter (Signed)
LVM for Debbie from [facility] to call back

## 2023-09-16 NOTE — Telephone Encounter (Signed)
Thank you :)

## 2023-09-21 ENCOUNTER — Telehealth (HOSPITAL_COMMUNITY): Payer: Self-pay | Admitting: Licensed Clinical Social Worker

## 2023-09-21 NOTE — Telephone Encounter (Signed)
H&V Care Navigation CSW Progress Note  Clinical Social Worker called pt to inquire if patient could still come to appt tomorrow- pt reports she can and has a ride.   SDOH Screenings   Food Insecurity: No Food Insecurity (08/26/2023)   Received from [facility]  Housing: Low Risk  (11/21/2022)  Transportation Needs: No Transportation Needs (08/26/2023)   Received from [facility]  Utilities: Not At Risk (08/26/2023)   Received from [facility]  Alcohol Screen: Low Risk  (11/21/2022)  Depression (PHQ2-9): Low Risk  (09/01/2023)  Financial Resource Strain: Low Risk  (11/21/2022)  Physical Activity: Inactive (11/21/2022)  Social Connections: Socially Integrated (11/21/2022)  Stress: No Stress Concern Present (08/26/2023)   Received from [facility]  Tobacco Use: Low Risk  (09/01/2023)    Burna Sis, LCSW Clinical Social Worker [facility] Desk#: [phone] Cell#: [phone]

## 2023-09-22 ENCOUNTER — Encounter (HOSPITAL_COMMUNITY): Payer: Self-pay | Admitting: Cardiology

## 2023-09-22 ENCOUNTER — Ambulatory Visit (HOSPITAL_COMMUNITY)
Admission: RE | Admit: 2023-09-22 | Discharge: 2023-09-22 | Disposition: A | Discharge status: Home / Self Care | Payer: PPO | Source: Ambulatory Visit | Attending: Cardiology | Admitting: Cardiology

## 2023-09-22 VITALS — BP 147/87 | HR 102 | Wt 260.0 lb

## 2023-09-22 DIAGNOSIS — I129 Hypertensive chronic kidney disease with stage 1 through stage 4 chronic kidney disease, or unspecified chronic kidney disease: Secondary | ICD-10-CM | POA: Diagnosis not present

## 2023-09-22 DIAGNOSIS — J9811 Atelectasis: Secondary | ICD-10-CM | POA: Diagnosis not present

## 2023-09-22 DIAGNOSIS — R410 Disorientation, unspecified: Secondary | ICD-10-CM | POA: Diagnosis not present

## 2023-09-22 DIAGNOSIS — E039 Hypothyroidism, unspecified: Secondary | ICD-10-CM | POA: Insufficient documentation

## 2023-09-22 DIAGNOSIS — R9431 Abnormal electrocardiogram [ECG] [EKG]: Secondary | ICD-10-CM | POA: Diagnosis not present

## 2023-09-22 DIAGNOSIS — K5731 Diverticulosis of large intestine without perforation or abscess with bleeding: Secondary | ICD-10-CM | POA: Diagnosis not present

## 2023-09-22 DIAGNOSIS — K921 Melena: Secondary | ICD-10-CM | POA: Diagnosis not present

## 2023-09-22 DIAGNOSIS — K922 Gastrointestinal hemorrhage, unspecified: Secondary | ICD-10-CM | POA: Diagnosis not present

## 2023-09-22 DIAGNOSIS — J984 Other disorders of lung: Secondary | ICD-10-CM | POA: Diagnosis not present

## 2023-09-22 DIAGNOSIS — I509 Heart failure, unspecified: Secondary | ICD-10-CM | POA: Diagnosis not present

## 2023-09-22 DIAGNOSIS — G4733 Obstructive sleep apnea (adult) (pediatric): Secondary | ICD-10-CM | POA: Diagnosis not present

## 2023-09-22 DIAGNOSIS — I081 Rheumatic disorders of both mitral and tricuspid valves: Secondary | ICD-10-CM | POA: Diagnosis not present

## 2023-09-22 DIAGNOSIS — R0603 Acute respiratory distress: Secondary | ICD-10-CM | POA: Diagnosis not present

## 2023-09-22 DIAGNOSIS — R1900 Intra-abdominal and pelvic swelling, mass and lump, unspecified site: Secondary | ICD-10-CM | POA: Diagnosis not present

## 2023-09-22 DIAGNOSIS — K31811 Angiodysplasia of stomach and duodenum with bleeding: Secondary | ICD-10-CM | POA: Diagnosis not present

## 2023-09-22 DIAGNOSIS — R739 Hyperglycemia, unspecified: Secondary | ICD-10-CM | POA: Diagnosis not present

## 2023-09-22 DIAGNOSIS — I517 Cardiomegaly: Secondary | ICD-10-CM | POA: Diagnosis not present

## 2023-09-22 DIAGNOSIS — E1165 Type 2 diabetes mellitus with hyperglycemia: Secondary | ICD-10-CM | POA: Diagnosis not present

## 2023-09-22 DIAGNOSIS — E785 Hyperlipidemia, unspecified: Secondary | ICD-10-CM | POA: Diagnosis not present

## 2023-09-22 DIAGNOSIS — I4819 Other persistent atrial fibrillation: Secondary | ICD-10-CM | POA: Diagnosis not present

## 2023-09-22 DIAGNOSIS — D631 Anemia in chronic kidney disease: Secondary | ICD-10-CM | POA: Diagnosis not present

## 2023-09-22 DIAGNOSIS — N1832 Chronic kidney disease, stage 3b: Secondary | ICD-10-CM | POA: Diagnosis not present

## 2023-09-22 DIAGNOSIS — I4891 Unspecified atrial fibrillation: Secondary | ICD-10-CM | POA: Insufficient documentation

## 2023-09-22 DIAGNOSIS — Z79899 Other long term (current) drug therapy: Secondary | ICD-10-CM | POA: Insufficient documentation

## 2023-09-22 DIAGNOSIS — E871 Hypo-osmolality and hyponatremia: Secondary | ICD-10-CM | POA: Diagnosis not present

## 2023-09-22 DIAGNOSIS — I700 Atherosclerosis of aorta: Secondary | ICD-10-CM | POA: Diagnosis not present

## 2023-09-22 DIAGNOSIS — I5033 Acute on chronic diastolic (congestive) heart failure: Secondary | ICD-10-CM | POA: Diagnosis not present

## 2023-09-22 DIAGNOSIS — D62 Acute posthemorrhagic anemia: Secondary | ICD-10-CM | POA: Diagnosis not present

## 2023-09-22 DIAGNOSIS — I130 Hypertensive heart and chronic kidney disease with heart failure and stage 1 through stage 4 chronic kidney disease, or unspecified chronic kidney disease: Secondary | ICD-10-CM | POA: Insufficient documentation

## 2023-09-22 DIAGNOSIS — J810 Acute pulmonary edema: Secondary | ICD-10-CM | POA: Diagnosis not present

## 2023-09-22 DIAGNOSIS — Z888 Allergy status to other drugs, medicaments and biological substances status: Secondary | ICD-10-CM | POA: Diagnosis not present

## 2023-09-22 DIAGNOSIS — I10 Essential (primary) hypertension: Secondary | ICD-10-CM | POA: Diagnosis not present

## 2023-09-22 DIAGNOSIS — R8281 Pyuria: Secondary | ICD-10-CM | POA: Diagnosis not present

## 2023-09-22 DIAGNOSIS — I3489 Other nonrheumatic mitral valve disorders: Secondary | ICD-10-CM | POA: Diagnosis not present

## 2023-09-22 DIAGNOSIS — N390 Urinary tract infection, site not specified: Secondary | ICD-10-CM | POA: Diagnosis not present

## 2023-09-22 DIAGNOSIS — I5032 Chronic diastolic (congestive) heart failure: Secondary | ICD-10-CM | POA: Diagnosis present

## 2023-09-22 DIAGNOSIS — Z7401 Bed confinement status: Secondary | ICD-10-CM | POA: Diagnosis not present

## 2023-09-22 DIAGNOSIS — Z6841 Body Mass Index (BMI) 40.0 and over, adult: Secondary | ICD-10-CM | POA: Insufficient documentation

## 2023-09-22 DIAGNOSIS — J811 Chronic pulmonary edema: Secondary | ICD-10-CM | POA: Diagnosis not present

## 2023-09-22 DIAGNOSIS — Z794 Long term (current) use of insulin: Secondary | ICD-10-CM | POA: Diagnosis not present

## 2023-09-22 DIAGNOSIS — R0602 Shortness of breath: Secondary | ICD-10-CM | POA: Diagnosis not present

## 2023-09-22 DIAGNOSIS — J90 Pleural effusion, not elsewhere classified: Secondary | ICD-10-CM | POA: Diagnosis not present

## 2023-09-22 DIAGNOSIS — Z881 Allergy status to other antibiotic agents status: Secondary | ICD-10-CM | POA: Diagnosis not present

## 2023-09-22 DIAGNOSIS — K5521 Angiodysplasia of colon with hemorrhage: Secondary | ICD-10-CM | POA: Diagnosis not present

## 2023-09-22 DIAGNOSIS — N3000 Acute cystitis without hematuria: Secondary | ICD-10-CM | POA: Diagnosis not present

## 2023-09-22 DIAGNOSIS — I480 Paroxysmal atrial fibrillation: Secondary | ICD-10-CM | POA: Diagnosis not present

## 2023-09-22 DIAGNOSIS — D72829 Elevated white blood cell count, unspecified: Secondary | ICD-10-CM | POA: Diagnosis not present

## 2023-09-22 DIAGNOSIS — K5730 Diverticulosis of large intestine without perforation or abscess without bleeding: Secondary | ICD-10-CM | POA: Diagnosis not present

## 2023-09-22 DIAGNOSIS — E1122 Type 2 diabetes mellitus with diabetic chronic kidney disease: Secondary | ICD-10-CM | POA: Diagnosis not present

## 2023-09-22 DIAGNOSIS — R0989 Other specified symptoms and signs involving the circulatory and respiratory systems: Secondary | ICD-10-CM | POA: Diagnosis not present

## 2023-09-22 DIAGNOSIS — I959 Hypotension, unspecified: Secondary | ICD-10-CM | POA: Diagnosis not present

## 2023-09-22 DIAGNOSIS — K439 Ventral hernia without obstruction or gangrene: Secondary | ICD-10-CM | POA: Diagnosis not present

## 2023-09-22 DIAGNOSIS — J8489 Other specified interstitial pulmonary diseases: Secondary | ICD-10-CM | POA: Diagnosis not present

## 2023-09-22 DIAGNOSIS — A419 Sepsis, unspecified organism: Secondary | ICD-10-CM | POA: Diagnosis not present

## 2023-09-22 DIAGNOSIS — N179 Acute kidney failure, unspecified: Secondary | ICD-10-CM | POA: Diagnosis not present

## 2023-09-22 DIAGNOSIS — K6389 Other specified diseases of intestine: Secondary | ICD-10-CM | POA: Diagnosis not present

## 2023-09-22 DIAGNOSIS — J9601 Acute respiratory failure with hypoxia: Secondary | ICD-10-CM | POA: Diagnosis not present

## 2023-09-22 DIAGNOSIS — D649 Anemia, unspecified: Secondary | ICD-10-CM | POA: Diagnosis not present

## 2023-09-22 DIAGNOSIS — R918 Other nonspecific abnormal finding of lung field: Secondary | ICD-10-CM | POA: Diagnosis not present

## 2023-09-22 DIAGNOSIS — R7989 Other specified abnormal findings of blood chemistry: Secondary | ICD-10-CM | POA: Diagnosis not present

## 2023-09-22 DIAGNOSIS — N838 Other noninflammatory disorders of ovary, fallopian tube and broad ligament: Secondary | ICD-10-CM | POA: Diagnosis not present

## 2023-09-22 NOTE — Patient Instructions (Signed)
PLEASE REPORT TO THE EMERGENCY ROOM TO BE SEEN.  Follow-Up in: TO BE DETERMINED   At the [facility], you and your health needs are our priority. We have a designated team specialized in the treatment of Heart Failure. This Care Team includes your primary Heart Failure Specialized Cardiologist (physician), Advanced Practice Providers (APPs- Physician Assistants and Nurse Practitioners), and Pharmacist who all work together to provide you with the care you need, when you need it.   You may see any of the following providers on your designated Care Team at your next follow up:  Dr. Arvilla Meres Dr. Marca Ancona Dr. Dorthula Nettles Dr. Theresia Bough Tonye Becket, NP Robbie Lis, [place] [facility] [place], [place] Brynda Peon, NP [place] Lee, NP Karle Plumber, PharmD   Please be sure to bring in all your medications bottles to every appointment.   Need to Contact us:  If you have any questions or concerns before your next appointment please send [place] a message through [place] or call our office at [phone].    TO LEAVE A MESSAGE FOR THE NURSE SELECT OPTION 2, PLEASE LEAVE A MESSAGE INCLUDING: YOUR NAME DATE OF BIRTH CALL BACK NUMBER REASON FOR CALL**this is important as we prioritize the call backs  YOU WILL RECEIVE A CALL BACK THE SAME DAY AS LONG AS YOU CALL BEFORE 4:00 PM

## 2023-09-22 NOTE — Progress Notes (Addendum)
ADVANCED HEART FAILURE CLINIC NOTE  Referring Physician: Georgina Quint, *  Primary Care: Georgina Quint, MD EP: Dr. Graciela Husbands  HPI: [name] is a 82 y.o. female with heart failure with preserved ejection fraction, atrial fibrillation (has been tried on flecainide, dofetilide, amiodarone, dronarodone), obesity presenting today to establish care.  According to [name], she has felt increasingly fatigued for the past day. Today on arrival, EKG with afib with RVR (rates 150s). On my exam, patient's HR approaching 160. She reports no lightheadedness, chest pain; shortness of breath/fatigue slightly worse than baseline. Reports compliance with her anticoagulation.     Past Medical History:  Diagnosis Date   Arthritis    Back pain    BCC (basal cell carcinoma) 04/01/2023   right lower chin, Mohs completed on 04/21/23   Chronic anticoagulation    due to aflutter   Chronic kidney disease    Diabetes mellitus    Diastolic CHF, chronic (HCC)    a.  echo 2006 - ef 55-65%; mild diast dysfxn;    b. Echo 08/2011: Mild LVH, EF 60%;  c. 04/2013 Echo: EF 65-69%, mild conc LVH;  08/2014 Echo: EF 60-65%, mild-mod MR.   Gout    Hyperlipidemia    Hypertension    a.  Renal arterial Dopplers 12/2011: 1-59% right renal artery stenosis   Morbid obesity (HCC)    Obstructive sleep apnea on CPAP    Paroxysmal Afib/Flutter    a. 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    Current Outpatient Medications  Medication Sig Dispense Refill   Accu-Chek Softclix Lancets lancets 1 each by Other route 3 (three) times daily. as directed 100 each 3   allopurinol (ZYLOPRIM) 300 MG tablet TAKE 1 TABLET BY MOUTH DAILY 100 tablet 2   amLODipine (NORVASC) 5 MG tablet      azelastine (ASTELIN) 0.1 % nasal spray 1-2 puffs each nostril at bedtime as needed 30 mL 12   b complex vitamins capsule Take 1 capsule by mouth daily.     BD PEN NEEDLE NANO 2ND GEN 32G X 4 MM MISC USE TWICE DAILY AS NEEDED 100 each  11   blood glucose meter kit and supplies Dispense based on patient and insurance preference. Use up to four times daily as directed. (FOR ICD-10 E10.9, E11.9). 1 each 0   cholecalciferol (VITAMIN D3) 25 MCG (1000 UNIT) tablet Take 1,000 Units by mouth daily.     clobetasol cream (TEMOVATE) 0.05 % Apply 1 Application topically 2 (two) times daily. 30 g 1   cloNIDine (CATAPRES) 0.3 MG tablet Take 1 tablet (0.3 mg total) by mouth 2 (two) times daily. 200 tablet 2   Continuous Blood Gluc Receiver (FREESTYLE LIBRE 2 READER) DEVI Use to check blood glucose. Use as directed 1 each 3   Continuous Glucose Sensor (FREESTYLE LIBRE 2 SENSOR) MISC USE AS DIRECTED TO CHECK BLOOD  SUGAR DAILY 8 each 2   dronedarone (MULTAQ) 400 MG tablet Take 1 tablet (400 mg total) by mouth 2 (two) times daily. Please keep scheduled appointment for future refills. Thank you. 180 tablet 0   ELIQUIS 5 MG TABS tablet TAKE 1 TABLET BY MOUTH TWICE  DAILY 200 tablet 2   Glucagon (GVOKE HYPOPEN 2-PACK) 0.5 MG/0.1ML SOAJ Inject 0.5 mg into the skin daily as needed. For Hypoglycemic events 0.2 mL 5   hydrocortisone 2.5 % cream APPLY TOPICALLY 2 TIMES DAILY AS NEEDED FOR RASH 454 g 0   Insulin Lispro Prot & Lispro (HUMALOG  MIX 75/25 KWIKPEN) (75-25) 100 UNIT/ML Kwikpen Inject 20 Units into the skin 2 (two) times daily before a meal. 45 mL 2   ketorolac (ACULAR) 0.5 % ophthalmic solution SMARTSIG:In Eye(s)     levothyroxine (SYNTHROID) 112 MCG tablet Take 1 tablet (112 mcg total) by mouth daily. 90 tablet 3   lidocaine (LIDODERM) 5 % Place 1 patch onto the skin daily. Remove & Discard patch within 12 hours or as directed by MD 30 patch 0   losartan (COZAAR) 50 MG tablet Take 1 tablet (50 mg total) by mouth daily. 90 tablet 3   metoprolol tartrate (LOPRESSOR) 25 MG tablet Take 1 tablet (25 mg total) by mouth 2 (two) times daily. 180 tablet 3   nystatin powder Apply 1 Application topically 2 (two) times daily. 60 g 7    nystatin-triamcinolone ointment (MYCOLOG) For up to 1 week as needed for rash them discontinue. May restart as needed for flares. Avoid applying to face, groin, and axilla. Use as directed. Long-term use can cause thinning of the skin. 30 g 1   ONETOUCH VERIO test strip 1 each by Other route 3 (three) times daily.     RESTASIS MULTIDOSE 0.05 % ophthalmic emulsion Place 1 drop into both eyes 2 (two) times daily.     rosuvastatin (CRESTOR) 10 MG tablet TAKE 1 TABLET BY MOUTH IN THE  EVENING 100 tablet 2   spironolactone (ALDACTONE) 25 MG tablet Take 1 tablet (25 mg total) by mouth daily. 90 tablet 3   tirzepatide (MOUNJARO) 7.5 MG/0.5ML Pen Inject 7.5 mg into the skin once a week. 6 mL 3   TOBRADEX ophthalmic ointment      torsemide (DEMADEX) 20 MG tablet Take 1 tablet (20 mg total) by mouth daily. Please keep scheduled appointment for future refills. Thank you. 90 tablet 0   triamcinolone cream (KENALOG) 0.1 % Apply 1 application topically 2 (two) times daily. 30 g 0   VELTASSA 8.4 g packet Take 1 packet by mouth daily.     ipratropium (ATROVENT HFA) 17 MCG/ACT inhaler Inhale 2 puffs into the lungs every 6 (six) hours as needed for wheezing. 1 each 2   No current facility-administered medications for this encounter.    Allergies  Allergen Reactions   Doxycycline     Made patient feel very ill    Metformin Diarrhea      Social History   Socioeconomic History   Marital status: Married    Spouse name: Not on file   Number of children: 3   Years of education: Not on file   Highest education level: Not on file  Occupational History   Occupation: DISABILITY/housewife    Employer: RETIRED  Tobacco Use   Smoking status: Never   Smokeless tobacco: Never  Vaping Use   Vaping status: Never Used  Substance and Sexual Activity   Alcohol use: No   Drug use: No   Sexual activity: Yes  Other Topics Concern   Not on file  Social History Narrative   Not on file   Social Determinants of  Health   Financial Resource Strain: Low Risk  (11/21/2022)   Overall Financial Resource Strain (CARDIA)    Difficulty of Paying Living Expenses: Not hard at all  Food Insecurity: No Food Insecurity (08/26/2023)   Received from [facility]   Hunger Vital Sign    Worried About Running Out of Food in the Last Year: Never true    Ran Out of Food in the Last Year:  Never true  Transportation Needs: No Transportation Needs (08/26/2023)   Received from [facility] - Transportation    Lack of Transportation (Medical): No    Lack of Transportation (Non-Medical): No  Physical Activity: Inactive (11/21/2022)   Exercise Vital Sign    Days of Exercise per Week: 0 days    Minutes of Exercise per Session: 0 min  Stress: No Stress Concern Present (08/26/2023)   Received from [facility] - Occupational Stress Questionnaire    Feeling of Stress : Not at all  Social Connections: Socially Integrated (11/21/2022)   Social Connection and Isolation Panel [NHANES]    Frequency of Communication with Friends and Family: More than three times a week    Frequency of Social Gatherings with Friends and Family: Never    Attends Religious Services: More than 4 times per year    Active Member of [employer] or Organizations: Yes    Attends Engineer, structural: More than 4 times per year    Marital Status: Married  Catering manager Violence: Not At Risk (08/17/2023)   Received from [facility]   HITS    Over the last 12 months how often did your partner physically hurt you?: Never    Over the last 12 months how often did your partner insult you or talk down to you?: Never    Over the last 12 months how often did your partner threaten you with physical harm?: Never    Over the last 12 months how often did your partner scream or curse at you?: Never      Family History  Problem Relation Age of Onset   Heart disease Father    Hypertension Father     Breast cancer Sister    Cancer Sister        breast   Colon cancer Neg Hx    Esophageal cancer Neg Hx    Pancreatic cancer Neg Hx    Liver disease Neg Hx     PHYSICAL EXAM: Vitals:   09/22/23 1121  BP: (!) 147/87  Pulse: (!) 102  SpO2: 95%   GENERAL: overweight WF; mildly dyspniec HEENT: Negative for arcus senilis or xanthelasma. There is no scleral icterus.  The mucous membranes are pink and moist.   NECK: Supple, No masses. Normal carotid upstrokes without bruits. No masses or thyromegaly.    CHEST: There are no chest wall deformities. There is no chest wall tenderness. Respirations are unlabored.  Lungs- CTA B/L CARDIAC:  JVP: 7 cm H2O         Tachycardic; irregulary No murmurs, rubs or gallops.  Pulses are 2+ and symmetrical in upper and lower extremities. Trace pretibial edema.  ABDOMEN: Soft, non-tender, non-distended. There are no masses or hepatomegaly. There are normal bowel sounds.  EXTREMITIES: Warm and well perfused with no cyanosis, clubbing.  LYMPHATIC: No axillary or supraclavicular lymphadenopathy.  NEUROLOGIC: Patient is oriented x3 with no focal or lateralizing neurologic deficits.  PSYCH: Patients affect is appropriate, there is no evidence of anxiety or depression.  SKIN: Warm and dry; no lesions or wounds.   DATA REVIEW  ECG: 09/22/23: atrial fibrillation with RVR  As per my personal interpretation  ECHO: 11/12: LVEF 60-65% 11/15: LVEF 60-65% 8/22: LVEF 65-70%   ASSESSMENT & PLAN:  Heart Failure with preserved EF - NYHA III; very limited functionally due to underlying HFpEF and severe morbid obesity - Volume status: Mildly hypervolemic on exam; likely exacerbated  by atrial fibrillation - Torsemide 20mg  daily - Continue spironolactone 25mg  daily,  - Last TTE from 2022; will need a repeat to re-evaluate LVEF & screen for infiltrative cardiomyopathies.  2. Atrial fibrillation with RVR - Followed by Dr. Graciela Husbands; she has been tried on several  anti-arrythmics (amiodarone, flecainide, dronaredone, dofetelide).  - NSR on 09/26/22 - Apixaban 5mg  BID - Today patient in afib w/ RVR with heart rates approaching 160s on my exam. She also feels increasingly fatigued. Would benefit from DCCV to NSR or improved rate control with inpatient admission. I explained this to her. She wishes to go back home and be admitted at [facility]. She understands risk/benefits. Her sister is here with her today. We will send her home to clinic with plan for evaluation at her local ER.   3. OSA  - Wears CPAP; reports that she is compliant.   4. Morbid Obesity - Body mass index is 49.13 kg/m.  5. HTN - Well controlled on amlodipine 5mg , losartan 50mg   6. Hypothyroidism  - Compliant with synthroid   Davaris Youtsey Advanced [facility] Failure Mechanical Circulatory Support

## 2023-09-23 DIAGNOSIS — N1832 Chronic kidney disease, stage 3b: Secondary | ICD-10-CM | POA: Diagnosis not present

## 2023-09-23 DIAGNOSIS — I4891 Unspecified atrial fibrillation: Secondary | ICD-10-CM | POA: Diagnosis not present

## 2023-09-23 DIAGNOSIS — I3489 Other nonrheumatic mitral valve disorders: Secondary | ICD-10-CM | POA: Diagnosis not present

## 2023-09-23 DIAGNOSIS — I517 Cardiomegaly: Secondary | ICD-10-CM | POA: Diagnosis not present

## 2023-09-23 DIAGNOSIS — G4733 Obstructive sleep apnea (adult) (pediatric): Secondary | ICD-10-CM | POA: Diagnosis not present

## 2023-09-23 DIAGNOSIS — N390 Urinary tract infection, site not specified: Secondary | ICD-10-CM | POA: Diagnosis not present

## 2023-09-24 ENCOUNTER — Telehealth: Payer: Self-pay | Admitting: Emergency Medicine

## 2023-09-24 DIAGNOSIS — J984 Other disorders of lung: Secondary | ICD-10-CM | POA: Diagnosis not present

## 2023-09-24 DIAGNOSIS — R0602 Shortness of breath: Secondary | ICD-10-CM | POA: Diagnosis not present

## 2023-09-24 DIAGNOSIS — N1832 Chronic kidney disease, stage 3b: Secondary | ICD-10-CM | POA: Diagnosis not present

## 2023-09-24 DIAGNOSIS — I4891 Unspecified atrial fibrillation: Secondary | ICD-10-CM | POA: Diagnosis not present

## 2023-09-24 DIAGNOSIS — R918 Other nonspecific abnormal finding of lung field: Secondary | ICD-10-CM | POA: Diagnosis not present

## 2023-09-24 DIAGNOSIS — G4733 Obstructive sleep apnea (adult) (pediatric): Secondary | ICD-10-CM | POA: Diagnosis not present

## 2023-09-24 NOTE — Telephone Encounter (Signed)
[facility] received the order for the CPAP supplies but they still need the clinical chart notes stating the diagnosis of sleep apnea and documentation that patient is using CPAP.  Please fax to [phone] or you can call [phone] -

## 2023-09-25 ENCOUNTER — Telehealth: Payer: Self-pay | Admitting: Emergency Medicine

## 2023-09-25 DIAGNOSIS — I4891 Unspecified atrial fibrillation: Secondary | ICD-10-CM | POA: Diagnosis not present

## 2023-09-25 DIAGNOSIS — G4733 Obstructive sleep apnea (adult) (pediatric): Secondary | ICD-10-CM | POA: Diagnosis not present

## 2023-09-25 DIAGNOSIS — N1832 Chronic kidney disease, stage 3b: Secondary | ICD-10-CM | POA: Diagnosis not present

## 2023-09-25 NOTE — Telephone Encounter (Signed)
Victorino Dike called stating she sent [place] a fax and will send it again just in case we did not receive her request. We should be receiving the fax request from [facility] for this pt requesting (THE LAST 3 VISITS COMPLETED OFFICE VISIT NOTES).  Send this info to  [phone] [facility]ISE, Lynford Humphrey

## 2023-09-26 DIAGNOSIS — I081 Rheumatic disorders of both mitral and tricuspid valves: Secondary | ICD-10-CM | POA: Diagnosis not present

## 2023-09-26 DIAGNOSIS — I4891 Unspecified atrial fibrillation: Secondary | ICD-10-CM | POA: Diagnosis not present

## 2023-09-26 DIAGNOSIS — I700 Atherosclerosis of aorta: Secondary | ICD-10-CM | POA: Diagnosis not present

## 2023-09-26 DIAGNOSIS — R410 Disorientation, unspecified: Secondary | ICD-10-CM | POA: Diagnosis not present

## 2023-09-26 DIAGNOSIS — G4733 Obstructive sleep apnea (adult) (pediatric): Secondary | ICD-10-CM | POA: Diagnosis not present

## 2023-09-26 DIAGNOSIS — I4819 Other persistent atrial fibrillation: Secondary | ICD-10-CM | POA: Diagnosis not present

## 2023-09-26 NOTE — Telephone Encounter (Signed)
Any of her office visit records that states she has obstructed sleep on CPAP will do. Thanks.

## 2023-09-27 DIAGNOSIS — J90 Pleural effusion, not elsewhere classified: Secondary | ICD-10-CM | POA: Diagnosis not present

## 2023-09-27 DIAGNOSIS — J9811 Atelectasis: Secondary | ICD-10-CM | POA: Diagnosis not present

## 2023-09-27 DIAGNOSIS — I4891 Unspecified atrial fibrillation: Secondary | ICD-10-CM | POA: Diagnosis not present

## 2023-09-27 DIAGNOSIS — I4819 Other persistent atrial fibrillation: Secondary | ICD-10-CM | POA: Diagnosis not present

## 2023-09-27 DIAGNOSIS — I517 Cardiomegaly: Secondary | ICD-10-CM | POA: Diagnosis not present

## 2023-09-27 DIAGNOSIS — I081 Rheumatic disorders of both mitral and tricuspid valves: Secondary | ICD-10-CM | POA: Diagnosis not present

## 2023-09-27 DIAGNOSIS — I700 Atherosclerosis of aorta: Secondary | ICD-10-CM | POA: Diagnosis not present

## 2023-09-28 DIAGNOSIS — I4891 Unspecified atrial fibrillation: Secondary | ICD-10-CM | POA: Diagnosis not present

## 2023-09-28 DIAGNOSIS — I700 Atherosclerosis of aorta: Secondary | ICD-10-CM | POA: Diagnosis not present

## 2023-09-28 DIAGNOSIS — N1832 Chronic kidney disease, stage 3b: Secondary | ICD-10-CM | POA: Diagnosis not present

## 2023-09-28 DIAGNOSIS — I4819 Other persistent atrial fibrillation: Secondary | ICD-10-CM | POA: Diagnosis not present

## 2023-09-28 DIAGNOSIS — I081 Rheumatic disorders of both mitral and tricuspid valves: Secondary | ICD-10-CM | POA: Diagnosis not present

## 2023-09-28 DIAGNOSIS — N390 Urinary tract infection, site not specified: Secondary | ICD-10-CM | POA: Diagnosis not present

## 2023-09-28 DIAGNOSIS — I5032 Chronic diastolic (congestive) heart failure: Secondary | ICD-10-CM | POA: Diagnosis not present

## 2023-09-28 DIAGNOSIS — R918 Other nonspecific abnormal finding of lung field: Secondary | ICD-10-CM | POA: Diagnosis not present

## 2023-09-28 DIAGNOSIS — G4733 Obstructive sleep apnea (adult) (pediatric): Secondary | ICD-10-CM | POA: Diagnosis not present

## 2023-09-28 DIAGNOSIS — J9601 Acute respiratory failure with hypoxia: Secondary | ICD-10-CM | POA: Diagnosis not present

## 2023-09-28 DIAGNOSIS — J90 Pleural effusion, not elsewhere classified: Secondary | ICD-10-CM | POA: Diagnosis not present

## 2023-09-28 DIAGNOSIS — E1122 Type 2 diabetes mellitus with diabetic chronic kidney disease: Secondary | ICD-10-CM | POA: Diagnosis not present

## 2023-09-28 DIAGNOSIS — I130 Hypertensive heart and chronic kidney disease with heart failure and stage 1 through stage 4 chronic kidney disease, or unspecified chronic kidney disease: Secondary | ICD-10-CM | POA: Diagnosis not present

## 2023-09-28 DIAGNOSIS — I480 Paroxysmal atrial fibrillation: Secondary | ICD-10-CM | POA: Diagnosis not present

## 2023-09-28 DIAGNOSIS — J810 Acute pulmonary edema: Secondary | ICD-10-CM | POA: Diagnosis not present

## 2023-09-28 NOTE — Telephone Encounter (Signed)
Notes faxed 11/14/2022

## 2023-09-28 NOTE — Telephone Encounter (Signed)
These orders should come from sleep apnea clinic handling her equipment.  Who is this office?

## 2023-09-28 NOTE — Telephone Encounter (Signed)
They also need a prescription for the cpap with standard tubing faxed in

## 2023-09-29 DIAGNOSIS — R918 Other nonspecific abnormal finding of lung field: Secondary | ICD-10-CM | POA: Diagnosis not present

## 2023-09-29 DIAGNOSIS — I480 Paroxysmal atrial fibrillation: Secondary | ICD-10-CM | POA: Diagnosis not present

## 2023-09-29 DIAGNOSIS — G4733 Obstructive sleep apnea (adult) (pediatric): Secondary | ICD-10-CM | POA: Diagnosis not present

## 2023-09-29 DIAGNOSIS — J9601 Acute respiratory failure with hypoxia: Secondary | ICD-10-CM | POA: Diagnosis not present

## 2023-09-29 DIAGNOSIS — I4891 Unspecified atrial fibrillation: Secondary | ICD-10-CM | POA: Diagnosis not present

## 2023-09-29 DIAGNOSIS — R0602 Shortness of breath: Secondary | ICD-10-CM | POA: Diagnosis not present

## 2023-09-29 DIAGNOSIS — J811 Chronic pulmonary edema: Secondary | ICD-10-CM | POA: Diagnosis not present

## 2023-09-29 DIAGNOSIS — J8489 Other specified interstitial pulmonary diseases: Secondary | ICD-10-CM | POA: Diagnosis not present

## 2023-09-29 DIAGNOSIS — J90 Pleural effusion, not elsewhere classified: Secondary | ICD-10-CM | POA: Diagnosis not present

## 2023-09-29 DIAGNOSIS — J810 Acute pulmonary edema: Secondary | ICD-10-CM | POA: Diagnosis not present

## 2023-09-29 DIAGNOSIS — R0989 Other specified symptoms and signs involving the circulatory and respiratory systems: Secondary | ICD-10-CM | POA: Diagnosis not present

## 2023-09-29 DIAGNOSIS — I517 Cardiomegaly: Secondary | ICD-10-CM | POA: Diagnosis not present

## 2023-09-30 DIAGNOSIS — I4891 Unspecified atrial fibrillation: Secondary | ICD-10-CM | POA: Diagnosis not present

## 2023-09-30 DIAGNOSIS — I480 Paroxysmal atrial fibrillation: Secondary | ICD-10-CM | POA: Diagnosis not present

## 2023-09-30 DIAGNOSIS — I517 Cardiomegaly: Secondary | ICD-10-CM | POA: Diagnosis not present

## 2023-09-30 DIAGNOSIS — G4733 Obstructive sleep apnea (adult) (pediatric): Secondary | ICD-10-CM | POA: Diagnosis not present

## 2023-09-30 DIAGNOSIS — R0602 Shortness of breath: Secondary | ICD-10-CM | POA: Diagnosis not present

## 2023-09-30 DIAGNOSIS — J9601 Acute respiratory failure with hypoxia: Secondary | ICD-10-CM | POA: Diagnosis not present

## 2023-09-30 DIAGNOSIS — J810 Acute pulmonary edema: Secondary | ICD-10-CM | POA: Diagnosis not present

## 2023-09-30 DIAGNOSIS — R0603 Acute respiratory distress: Secondary | ICD-10-CM | POA: Diagnosis not present

## 2023-09-30 DIAGNOSIS — R918 Other nonspecific abnormal finding of lung field: Secondary | ICD-10-CM | POA: Diagnosis not present

## 2023-09-30 NOTE — Telephone Encounter (Signed)
Supply orders has been faxed to [facility] pulmonary

## 2023-09-30 NOTE — Telephone Encounter (Signed)
Last 3 OV notes has been faxed to the number giving

## 2023-10-01 DIAGNOSIS — J9601 Acute respiratory failure with hypoxia: Secondary | ICD-10-CM | POA: Diagnosis not present

## 2023-10-01 DIAGNOSIS — I4891 Unspecified atrial fibrillation: Secondary | ICD-10-CM | POA: Diagnosis not present

## 2023-10-01 DIAGNOSIS — J810 Acute pulmonary edema: Secondary | ICD-10-CM | POA: Diagnosis not present

## 2023-10-01 DIAGNOSIS — I480 Paroxysmal atrial fibrillation: Secondary | ICD-10-CM | POA: Diagnosis not present

## 2023-10-01 DIAGNOSIS — G4733 Obstructive sleep apnea (adult) (pediatric): Secondary | ICD-10-CM | POA: Diagnosis not present

## 2023-10-02 DIAGNOSIS — N1832 Chronic kidney disease, stage 3b: Secondary | ICD-10-CM | POA: Diagnosis not present

## 2023-10-02 DIAGNOSIS — I959 Hypotension, unspecified: Secondary | ICD-10-CM | POA: Diagnosis not present

## 2023-10-02 DIAGNOSIS — I4891 Unspecified atrial fibrillation: Secondary | ICD-10-CM | POA: Diagnosis not present

## 2023-10-02 DIAGNOSIS — R918 Other nonspecific abnormal finding of lung field: Secondary | ICD-10-CM | POA: Diagnosis not present

## 2023-10-02 DIAGNOSIS — R0602 Shortness of breath: Secondary | ICD-10-CM | POA: Diagnosis not present

## 2023-10-03 DIAGNOSIS — I4891 Unspecified atrial fibrillation: Secondary | ICD-10-CM | POA: Diagnosis not present

## 2023-10-04 DIAGNOSIS — I4891 Unspecified atrial fibrillation: Secondary | ICD-10-CM | POA: Diagnosis not present

## 2023-10-05 DIAGNOSIS — R7989 Other specified abnormal findings of blood chemistry: Secondary | ICD-10-CM | POA: Diagnosis not present

## 2023-10-05 DIAGNOSIS — I10 Essential (primary) hypertension: Secondary | ICD-10-CM | POA: Diagnosis not present

## 2023-10-05 DIAGNOSIS — E785 Hyperlipidemia, unspecified: Secondary | ICD-10-CM | POA: Diagnosis not present

## 2023-10-05 DIAGNOSIS — I4891 Unspecified atrial fibrillation: Secondary | ICD-10-CM | POA: Diagnosis not present

## 2023-10-06 DIAGNOSIS — D62 Acute posthemorrhagic anemia: Secondary | ICD-10-CM | POA: Diagnosis not present

## 2023-10-06 DIAGNOSIS — E785 Hyperlipidemia, unspecified: Secondary | ICD-10-CM | POA: Diagnosis not present

## 2023-10-06 DIAGNOSIS — R7989 Other specified abnormal findings of blood chemistry: Secondary | ICD-10-CM | POA: Diagnosis not present

## 2023-10-06 DIAGNOSIS — K921 Melena: Secondary | ICD-10-CM | POA: Diagnosis not present

## 2023-10-06 DIAGNOSIS — I10 Essential (primary) hypertension: Secondary | ICD-10-CM | POA: Diagnosis not present

## 2023-10-06 DIAGNOSIS — I4891 Unspecified atrial fibrillation: Secondary | ICD-10-CM | POA: Diagnosis not present

## 2023-10-07 DIAGNOSIS — K921 Melena: Secondary | ICD-10-CM | POA: Diagnosis not present

## 2023-10-07 DIAGNOSIS — D62 Acute posthemorrhagic anemia: Secondary | ICD-10-CM | POA: Diagnosis not present

## 2023-10-07 DIAGNOSIS — I480 Paroxysmal atrial fibrillation: Secondary | ICD-10-CM | POA: Diagnosis not present

## 2023-10-07 DIAGNOSIS — E1122 Type 2 diabetes mellitus with diabetic chronic kidney disease: Secondary | ICD-10-CM | POA: Diagnosis not present

## 2023-10-07 DIAGNOSIS — K5521 Angiodysplasia of colon with hemorrhage: Secondary | ICD-10-CM | POA: Diagnosis not present

## 2023-10-07 DIAGNOSIS — I4891 Unspecified atrial fibrillation: Secondary | ICD-10-CM | POA: Diagnosis not present

## 2023-10-07 DIAGNOSIS — E785 Hyperlipidemia, unspecified: Secondary | ICD-10-CM | POA: Diagnosis not present

## 2023-10-07 DIAGNOSIS — I130 Hypertensive heart and chronic kidney disease with heart failure and stage 1 through stage 4 chronic kidney disease, or unspecified chronic kidney disease: Secondary | ICD-10-CM | POA: Diagnosis not present

## 2023-10-07 DIAGNOSIS — K922 Gastrointestinal hemorrhage, unspecified: Secondary | ICD-10-CM | POA: Diagnosis not present

## 2023-10-07 DIAGNOSIS — R7989 Other specified abnormal findings of blood chemistry: Secondary | ICD-10-CM | POA: Diagnosis not present

## 2023-10-07 DIAGNOSIS — N1832 Chronic kidney disease, stage 3b: Secondary | ICD-10-CM | POA: Diagnosis not present

## 2023-10-07 DIAGNOSIS — I10 Essential (primary) hypertension: Secondary | ICD-10-CM | POA: Diagnosis not present

## 2023-10-07 DIAGNOSIS — I509 Heart failure, unspecified: Secondary | ICD-10-CM | POA: Diagnosis not present

## 2023-10-07 DIAGNOSIS — G4733 Obstructive sleep apnea (adult) (pediatric): Secondary | ICD-10-CM | POA: Diagnosis not present

## 2023-10-07 DIAGNOSIS — K31811 Angiodysplasia of stomach and duodenum with bleeding: Secondary | ICD-10-CM | POA: Diagnosis not present

## 2023-10-08 ENCOUNTER — Telehealth: Payer: Self-pay

## 2023-10-08 DIAGNOSIS — E1122 Type 2 diabetes mellitus with diabetic chronic kidney disease: Secondary | ICD-10-CM | POA: Diagnosis not present

## 2023-10-08 DIAGNOSIS — K5730 Diverticulosis of large intestine without perforation or abscess without bleeding: Secondary | ICD-10-CM | POA: Diagnosis not present

## 2023-10-08 DIAGNOSIS — E785 Hyperlipidemia, unspecified: Secondary | ICD-10-CM | POA: Diagnosis not present

## 2023-10-08 DIAGNOSIS — G4733 Obstructive sleep apnea (adult) (pediatric): Secondary | ICD-10-CM | POA: Diagnosis not present

## 2023-10-08 DIAGNOSIS — K31811 Angiodysplasia of stomach and duodenum with bleeding: Secondary | ICD-10-CM | POA: Diagnosis not present

## 2023-10-08 DIAGNOSIS — I5032 Chronic diastolic (congestive) heart failure: Secondary | ICD-10-CM | POA: Diagnosis not present

## 2023-10-08 DIAGNOSIS — K921 Melena: Secondary | ICD-10-CM | POA: Diagnosis not present

## 2023-10-08 DIAGNOSIS — K5731 Diverticulosis of large intestine without perforation or abscess with bleeding: Secondary | ICD-10-CM | POA: Diagnosis not present

## 2023-10-08 DIAGNOSIS — I480 Paroxysmal atrial fibrillation: Secondary | ICD-10-CM | POA: Diagnosis not present

## 2023-10-08 DIAGNOSIS — I10 Essential (primary) hypertension: Secondary | ICD-10-CM | POA: Diagnosis not present

## 2023-10-08 DIAGNOSIS — I4891 Unspecified atrial fibrillation: Secondary | ICD-10-CM | POA: Diagnosis not present

## 2023-10-08 DIAGNOSIS — R7989 Other specified abnormal findings of blood chemistry: Secondary | ICD-10-CM | POA: Diagnosis not present

## 2023-10-08 DIAGNOSIS — I130 Hypertensive heart and chronic kidney disease with heart failure and stage 1 through stage 4 chronic kidney disease, or unspecified chronic kidney disease: Secondary | ICD-10-CM | POA: Diagnosis not present

## 2023-10-08 DIAGNOSIS — N1832 Chronic kidney disease, stage 3b: Secondary | ICD-10-CM | POA: Diagnosis not present

## 2023-10-08 DIAGNOSIS — D62 Acute posthemorrhagic anemia: Secondary | ICD-10-CM | POA: Diagnosis not present

## 2023-10-08 NOTE — Telephone Encounter (Signed)
Copied from CRM [id number]. Topic: General - Other >> Oct 07, 2023  4:12 PM Irine Seal wrote: Reason for CRM: Agent from [facility] called in stating that for insurance purposes the provider needs to sign and date and update to patients chart that they still currently need the CPAP supplies because that is her current dx. And that he will be faxing in a rx request for heated CPAP tubing.    [facility]-cpap  ian  [phone] fax [phone]

## 2023-10-08 NOTE — Telephone Encounter (Signed)
PW received and placed in provider basket

## 2023-10-09 DIAGNOSIS — D649 Anemia, unspecified: Secondary | ICD-10-CM | POA: Diagnosis not present

## 2023-10-09 DIAGNOSIS — G4733 Obstructive sleep apnea (adult) (pediatric): Secondary | ICD-10-CM | POA: Diagnosis not present

## 2023-10-09 DIAGNOSIS — R918 Other nonspecific abnormal finding of lung field: Secondary | ICD-10-CM | POA: Diagnosis not present

## 2023-10-09 DIAGNOSIS — K5900 Constipation, unspecified: Secondary | ICD-10-CM | POA: Diagnosis not present

## 2023-10-09 DIAGNOSIS — Z794 Long term (current) use of insulin: Secondary | ICD-10-CM | POA: Diagnosis not present

## 2023-10-09 DIAGNOSIS — E66813 Obesity, class 3: Secondary | ICD-10-CM | POA: Diagnosis not present

## 2023-10-09 DIAGNOSIS — R0602 Shortness of breath: Secondary | ICD-10-CM | POA: Diagnosis not present

## 2023-10-09 DIAGNOSIS — I4891 Unspecified atrial fibrillation: Secondary | ICD-10-CM | POA: Diagnosis not present

## 2023-10-09 DIAGNOSIS — I517 Cardiomegaly: Secondary | ICD-10-CM | POA: Diagnosis not present

## 2023-10-09 DIAGNOSIS — J90 Pleural effusion, not elsewhere classified: Secondary | ICD-10-CM | POA: Diagnosis not present

## 2023-10-09 DIAGNOSIS — E876 Hypokalemia: Secondary | ICD-10-CM | POA: Diagnosis not present

## 2023-10-09 DIAGNOSIS — E785 Hyperlipidemia, unspecified: Secondary | ICD-10-CM | POA: Diagnosis not present

## 2023-10-09 DIAGNOSIS — B952 Enterococcus as the cause of diseases classified elsewhere: Secondary | ICD-10-CM | POA: Diagnosis not present

## 2023-10-09 DIAGNOSIS — I10 Essential (primary) hypertension: Secondary | ICD-10-CM | POA: Diagnosis not present

## 2023-10-09 DIAGNOSIS — N1832 Chronic kidney disease, stage 3b: Secondary | ICD-10-CM | POA: Diagnosis not present

## 2023-10-09 DIAGNOSIS — N3090 Cystitis, unspecified without hematuria: Secondary | ICD-10-CM | POA: Diagnosis not present

## 2023-10-09 DIAGNOSIS — N179 Acute kidney failure, unspecified: Secondary | ICD-10-CM | POA: Diagnosis not present

## 2023-10-09 DIAGNOSIS — J9601 Acute respiratory failure with hypoxia: Secondary | ICD-10-CM | POA: Diagnosis not present

## 2023-10-09 DIAGNOSIS — I130 Hypertensive heart and chronic kidney disease with heart failure and stage 1 through stage 4 chronic kidney disease, or unspecified chronic kidney disease: Secondary | ICD-10-CM | POA: Diagnosis not present

## 2023-10-09 DIAGNOSIS — K7460 Unspecified cirrhosis of liver: Secondary | ICD-10-CM | POA: Diagnosis not present

## 2023-10-09 DIAGNOSIS — I480 Paroxysmal atrial fibrillation: Secondary | ICD-10-CM | POA: Diagnosis not present

## 2023-10-09 DIAGNOSIS — J810 Acute pulmonary edema: Secondary | ICD-10-CM | POA: Diagnosis not present

## 2023-10-09 DIAGNOSIS — D6489 Other specified anemias: Secondary | ICD-10-CM | POA: Diagnosis not present

## 2023-10-09 DIAGNOSIS — Z7401 Bed confinement status: Secondary | ICD-10-CM | POA: Diagnosis not present

## 2023-10-09 DIAGNOSIS — R2689 Other abnormalities of gait and mobility: Secondary | ICD-10-CM | POA: Diagnosis not present

## 2023-10-09 DIAGNOSIS — K921 Melena: Secondary | ICD-10-CM | POA: Diagnosis not present

## 2023-10-09 DIAGNOSIS — Z6841 Body Mass Index (BMI) 40.0 and over, adult: Secondary | ICD-10-CM | POA: Diagnosis not present

## 2023-10-09 DIAGNOSIS — R5381 Other malaise: Secondary | ICD-10-CM | POA: Diagnosis not present

## 2023-10-09 DIAGNOSIS — I2510 Atherosclerotic heart disease of native coronary artery without angina pectoris: Secondary | ICD-10-CM | POA: Diagnosis not present

## 2023-10-09 DIAGNOSIS — E1165 Type 2 diabetes mellitus with hyperglycemia: Secondary | ICD-10-CM | POA: Diagnosis not present

## 2023-10-09 DIAGNOSIS — N183 Chronic kidney disease, stage 3 unspecified: Secondary | ICD-10-CM | POA: Diagnosis not present

## 2023-10-09 DIAGNOSIS — D62 Acute posthemorrhagic anemia: Secondary | ICD-10-CM | POA: Diagnosis not present

## 2023-10-09 DIAGNOSIS — J984 Other disorders of lung: Secondary | ICD-10-CM | POA: Diagnosis not present

## 2023-10-09 DIAGNOSIS — I5033 Acute on chronic diastolic (congestive) heart failure: Secondary | ICD-10-CM | POA: Diagnosis not present

## 2023-10-09 DIAGNOSIS — E039 Hypothyroidism, unspecified: Secondary | ICD-10-CM | POA: Diagnosis not present

## 2023-10-09 DIAGNOSIS — I5030 Unspecified diastolic (congestive) heart failure: Secondary | ICD-10-CM | POA: Diagnosis not present

## 2023-10-09 DIAGNOSIS — J811 Chronic pulmonary edema: Secondary | ICD-10-CM | POA: Diagnosis not present

## 2023-10-09 NOTE — Telephone Encounter (Signed)
PW has been forwarded to [facility]

## 2023-10-10 DIAGNOSIS — D62 Acute posthemorrhagic anemia: Secondary | ICD-10-CM | POA: Diagnosis not present

## 2023-10-10 DIAGNOSIS — I5030 Unspecified diastolic (congestive) heart failure: Secondary | ICD-10-CM | POA: Diagnosis not present

## 2023-10-10 DIAGNOSIS — K921 Melena: Secondary | ICD-10-CM | POA: Diagnosis not present

## 2023-10-10 DIAGNOSIS — D6489 Other specified anemias: Secondary | ICD-10-CM | POA: Diagnosis not present

## 2023-10-10 DIAGNOSIS — N1832 Chronic kidney disease, stage 3b: Secondary | ICD-10-CM | POA: Diagnosis not present

## 2023-10-10 DIAGNOSIS — I10 Essential (primary) hypertension: Secondary | ICD-10-CM | POA: Diagnosis not present

## 2023-10-10 DIAGNOSIS — I480 Paroxysmal atrial fibrillation: Secondary | ICD-10-CM | POA: Diagnosis not present

## 2023-10-10 DIAGNOSIS — I4891 Unspecified atrial fibrillation: Secondary | ICD-10-CM | POA: Diagnosis not present

## 2023-10-10 DIAGNOSIS — J810 Acute pulmonary edema: Secondary | ICD-10-CM | POA: Diagnosis not present

## 2023-10-10 DIAGNOSIS — J9601 Acute respiratory failure with hypoxia: Secondary | ICD-10-CM | POA: Diagnosis not present

## 2023-10-10 DIAGNOSIS — I5033 Acute on chronic diastolic (congestive) heart failure: Secondary | ICD-10-CM | POA: Diagnosis not present

## 2023-10-10 DIAGNOSIS — R0602 Shortness of breath: Secondary | ICD-10-CM | POA: Diagnosis not present

## 2023-10-10 DIAGNOSIS — J984 Other disorders of lung: Secondary | ICD-10-CM | POA: Diagnosis not present

## 2023-10-10 DIAGNOSIS — G4733 Obstructive sleep apnea (adult) (pediatric): Secondary | ICD-10-CM | POA: Diagnosis not present

## 2023-10-12 DIAGNOSIS — I480 Paroxysmal atrial fibrillation: Secondary | ICD-10-CM | POA: Diagnosis not present

## 2023-10-12 DIAGNOSIS — I5030 Unspecified diastolic (congestive) heart failure: Secondary | ICD-10-CM | POA: Diagnosis not present

## 2023-10-12 DIAGNOSIS — D6489 Other specified anemias: Secondary | ICD-10-CM | POA: Diagnosis not present

## 2023-10-12 DIAGNOSIS — J9601 Acute respiratory failure with hypoxia: Secondary | ICD-10-CM | POA: Diagnosis not present

## 2023-10-12 DIAGNOSIS — I10 Essential (primary) hypertension: Secondary | ICD-10-CM | POA: Diagnosis not present

## 2023-10-13 DIAGNOSIS — I480 Paroxysmal atrial fibrillation: Secondary | ICD-10-CM | POA: Diagnosis not present

## 2023-10-13 DIAGNOSIS — J9601 Acute respiratory failure with hypoxia: Secondary | ICD-10-CM | POA: Diagnosis not present

## 2023-10-13 DIAGNOSIS — D6489 Other specified anemias: Secondary | ICD-10-CM | POA: Diagnosis not present

## 2023-10-13 DIAGNOSIS — I5030 Unspecified diastolic (congestive) heart failure: Secondary | ICD-10-CM | POA: Diagnosis not present

## 2023-10-13 DIAGNOSIS — I10 Essential (primary) hypertension: Secondary | ICD-10-CM | POA: Diagnosis not present

## 2023-10-14 DIAGNOSIS — I5030 Unspecified diastolic (congestive) heart failure: Secondary | ICD-10-CM | POA: Diagnosis not present

## 2023-10-14 DIAGNOSIS — J9601 Acute respiratory failure with hypoxia: Secondary | ICD-10-CM | POA: Diagnosis not present

## 2023-10-14 DIAGNOSIS — I480 Paroxysmal atrial fibrillation: Secondary | ICD-10-CM | POA: Diagnosis not present

## 2023-10-14 DIAGNOSIS — I10 Essential (primary) hypertension: Secondary | ICD-10-CM | POA: Diagnosis not present

## 2023-10-16 ENCOUNTER — Telehealth: Payer: Self-pay | Admitting: Internal Medicine

## 2023-10-16 DIAGNOSIS — I10 Essential (primary) hypertension: Secondary | ICD-10-CM | POA: Diagnosis not present

## 2023-10-16 DIAGNOSIS — I480 Paroxysmal atrial fibrillation: Secondary | ICD-10-CM | POA: Diagnosis not present

## 2023-10-16 DIAGNOSIS — I5030 Unspecified diastolic (congestive) heart failure: Secondary | ICD-10-CM | POA: Diagnosis not present

## 2023-10-16 DIAGNOSIS — J9601 Acute respiratory failure with hypoxia: Secondary | ICD-10-CM | POA: Diagnosis not present

## 2023-10-16 NOTE — Telephone Encounter (Signed)
[name] calling in bc they are in charge of patient CPAP supplies and the Chart notes from 09/01/23 didn't include the CPAP discussion, no notes indicates that she is using a cpap.

## 2023-10-18 DIAGNOSIS — R5381 Other malaise: Secondary | ICD-10-CM | POA: Diagnosis not present

## 2023-10-18 DIAGNOSIS — N3090 Cystitis, unspecified without hematuria: Secondary | ICD-10-CM | POA: Diagnosis not present

## 2023-10-18 DIAGNOSIS — R2689 Other abnormalities of gait and mobility: Secondary | ICD-10-CM | POA: Diagnosis not present

## 2023-10-18 DIAGNOSIS — I517 Cardiomegaly: Secondary | ICD-10-CM | POA: Diagnosis not present

## 2023-10-18 DIAGNOSIS — K7460 Unspecified cirrhosis of liver: Secondary | ICD-10-CM | POA: Diagnosis not present

## 2023-10-18 DIAGNOSIS — I4891 Unspecified atrial fibrillation: Secondary | ICD-10-CM | POA: Diagnosis not present

## 2023-10-18 DIAGNOSIS — J90 Pleural effusion, not elsewhere classified: Secondary | ICD-10-CM | POA: Diagnosis not present

## 2023-10-18 DIAGNOSIS — I130 Hypertensive heart and chronic kidney disease with heart failure and stage 1 through stage 4 chronic kidney disease, or unspecified chronic kidney disease: Secondary | ICD-10-CM | POA: Diagnosis not present

## 2023-10-18 DIAGNOSIS — J811 Chronic pulmonary edema: Secondary | ICD-10-CM | POA: Diagnosis not present

## 2023-10-18 DIAGNOSIS — J810 Acute pulmonary edema: Secondary | ICD-10-CM | POA: Diagnosis not present

## 2023-10-18 DIAGNOSIS — I2510 Atherosclerotic heart disease of native coronary artery without angina pectoris: Secondary | ICD-10-CM | POA: Diagnosis not present

## 2023-10-19 DIAGNOSIS — J9601 Acute respiratory failure with hypoxia: Secondary | ICD-10-CM | POA: Diagnosis not present

## 2023-10-19 DIAGNOSIS — I10 Essential (primary) hypertension: Secondary | ICD-10-CM | POA: Diagnosis not present

## 2023-10-19 DIAGNOSIS — I480 Paroxysmal atrial fibrillation: Secondary | ICD-10-CM | POA: Diagnosis not present

## 2023-10-19 DIAGNOSIS — I5030 Unspecified diastolic (congestive) heart failure: Secondary | ICD-10-CM | POA: Diagnosis not present

## 2023-10-20 DIAGNOSIS — I10 Essential (primary) hypertension: Secondary | ICD-10-CM | POA: Diagnosis not present

## 2023-10-20 DIAGNOSIS — I5030 Unspecified diastolic (congestive) heart failure: Secondary | ICD-10-CM | POA: Diagnosis not present

## 2023-10-20 DIAGNOSIS — J9601 Acute respiratory failure with hypoxia: Secondary | ICD-10-CM | POA: Diagnosis not present

## 2023-10-20 DIAGNOSIS — I480 Paroxysmal atrial fibrillation: Secondary | ICD-10-CM | POA: Diagnosis not present

## 2023-10-22 DIAGNOSIS — I480 Paroxysmal atrial fibrillation: Secondary | ICD-10-CM | POA: Diagnosis not present

## 2023-10-22 DIAGNOSIS — J9601 Acute respiratory failure with hypoxia: Secondary | ICD-10-CM | POA: Diagnosis not present

## 2023-10-22 DIAGNOSIS — I10 Essential (primary) hypertension: Secondary | ICD-10-CM | POA: Diagnosis not present

## 2023-10-22 DIAGNOSIS — I5030 Unspecified diastolic (congestive) heart failure: Secondary | ICD-10-CM | POA: Diagnosis not present

## 2023-10-23 DIAGNOSIS — N390 Urinary tract infection, site not specified: Secondary | ICD-10-CM | POA: Diagnosis not present

## 2023-10-23 DIAGNOSIS — I4891 Unspecified atrial fibrillation: Secondary | ICD-10-CM | POA: Diagnosis not present

## 2023-10-23 DIAGNOSIS — Z515 Encounter for palliative care: Secondary | ICD-10-CM | POA: Diagnosis not present

## 2023-10-23 DIAGNOSIS — R000 Tachycardia, unspecified: Secondary | ICD-10-CM | POA: Diagnosis not present

## 2023-10-23 DIAGNOSIS — Z6841 Body Mass Index (BMI) 40.0 and over, adult: Secondary | ICD-10-CM | POA: Diagnosis not present

## 2023-10-23 DIAGNOSIS — J9811 Atelectasis: Secondary | ICD-10-CM | POA: Diagnosis not present

## 2023-10-23 DIAGNOSIS — I5032 Chronic diastolic (congestive) heart failure: Secondary | ICD-10-CM | POA: Diagnosis not present

## 2023-10-23 DIAGNOSIS — J811 Chronic pulmonary edema: Secondary | ICD-10-CM | POA: Diagnosis not present

## 2023-10-23 DIAGNOSIS — R0902 Hypoxemia: Secondary | ICD-10-CM | POA: Diagnosis not present

## 2023-10-23 DIAGNOSIS — E1165 Type 2 diabetes mellitus with hyperglycemia: Secondary | ICD-10-CM | POA: Diagnosis not present

## 2023-10-23 DIAGNOSIS — R069 Unspecified abnormalities of breathing: Secondary | ICD-10-CM | POA: Diagnosis not present

## 2023-10-23 DIAGNOSIS — J90 Pleural effusion, not elsewhere classified: Secondary | ICD-10-CM | POA: Diagnosis not present

## 2023-10-23 DIAGNOSIS — I5033 Acute on chronic diastolic (congestive) heart failure: Secondary | ICD-10-CM | POA: Diagnosis not present

## 2023-10-23 DIAGNOSIS — R0602 Shortness of breath: Secondary | ICD-10-CM | POA: Diagnosis not present

## 2023-10-23 DIAGNOSIS — I130 Hypertensive heart and chronic kidney disease with heart failure and stage 1 through stage 4 chronic kidney disease, or unspecified chronic kidney disease: Secondary | ICD-10-CM | POA: Diagnosis not present

## 2023-10-23 DIAGNOSIS — J9620 Acute and chronic respiratory failure, unspecified whether with hypoxia or hypercapnia: Secondary | ICD-10-CM | POA: Diagnosis not present

## 2023-10-23 DIAGNOSIS — J9621 Acute and chronic respiratory failure with hypoxia: Secondary | ICD-10-CM | POA: Diagnosis not present

## 2023-10-23 DIAGNOSIS — E873 Alkalosis: Secondary | ICD-10-CM | POA: Diagnosis not present

## 2023-10-23 DIAGNOSIS — R5381 Other malaise: Secondary | ICD-10-CM | POA: Diagnosis not present

## 2023-10-23 DIAGNOSIS — I080 Rheumatic disorders of both mitral and aortic valves: Secondary | ICD-10-CM | POA: Diagnosis not present

## 2023-10-23 DIAGNOSIS — R918 Other nonspecific abnormal finding of lung field: Secondary | ICD-10-CM | POA: Diagnosis not present

## 2023-10-23 DIAGNOSIS — J9809 Other diseases of bronchus, not elsewhere classified: Secondary | ICD-10-CM | POA: Diagnosis not present

## 2023-10-23 DIAGNOSIS — Z7189 Other specified counseling: Secondary | ICD-10-CM | POA: Diagnosis not present

## 2023-10-23 DIAGNOSIS — I10 Essential (primary) hypertension: Secondary | ICD-10-CM | POA: Diagnosis not present

## 2023-10-23 DIAGNOSIS — J984 Other disorders of lung: Secondary | ICD-10-CM | POA: Diagnosis not present

## 2023-10-23 DIAGNOSIS — N1832 Chronic kidney disease, stage 3b: Secondary | ICD-10-CM | POA: Diagnosis not present

## 2023-10-23 DIAGNOSIS — E785 Hyperlipidemia, unspecified: Secondary | ICD-10-CM | POA: Diagnosis not present

## 2023-10-23 DIAGNOSIS — R531 Weakness: Secondary | ICD-10-CM | POA: Diagnosis not present

## 2023-10-23 DIAGNOSIS — E039 Hypothyroidism, unspecified: Secondary | ICD-10-CM | POA: Diagnosis not present

## 2023-10-23 DIAGNOSIS — G4733 Obstructive sleep apnea (adult) (pediatric): Secondary | ICD-10-CM | POA: Diagnosis not present

## 2023-10-23 DIAGNOSIS — I5031 Acute diastolic (congestive) heart failure: Secondary | ICD-10-CM | POA: Diagnosis not present

## 2023-10-23 DIAGNOSIS — I4819 Other persistent atrial fibrillation: Secondary | ICD-10-CM | POA: Diagnosis not present

## 2023-10-23 DIAGNOSIS — I517 Cardiomegaly: Secondary | ICD-10-CM | POA: Diagnosis not present

## 2023-10-23 DIAGNOSIS — F419 Anxiety disorder, unspecified: Secondary | ICD-10-CM | POA: Diagnosis not present

## 2023-10-23 DIAGNOSIS — J849 Interstitial pulmonary disease, unspecified: Secondary | ICD-10-CM | POA: Diagnosis not present

## 2023-10-23 DIAGNOSIS — F4321 Adjustment disorder with depressed mood: Secondary | ICD-10-CM | POA: Diagnosis not present

## 2023-10-23 DIAGNOSIS — N179 Acute kidney failure, unspecified: Secondary | ICD-10-CM | POA: Diagnosis not present

## 2023-10-23 DIAGNOSIS — Z794 Long term (current) use of insulin: Secondary | ICD-10-CM | POA: Diagnosis not present

## 2023-10-23 NOTE — Telephone Encounter (Signed)
 Called [facility] and spoke with Roxy  She states that they are needing updated ov note discussing CPAP use/need  I advised her that the pt has not been seen since 05/2022 and will need appt  She states they will place call to pt to let her know this information  Nothing further needed

## 2023-10-26 DIAGNOSIS — Z515 Encounter for palliative care: Secondary | ICD-10-CM | POA: Diagnosis not present

## 2023-10-26 DIAGNOSIS — F419 Anxiety disorder, unspecified: Secondary | ICD-10-CM | POA: Diagnosis not present

## 2023-10-26 DIAGNOSIS — Z7189 Other specified counseling: Secondary | ICD-10-CM | POA: Diagnosis not present

## 2023-10-26 DIAGNOSIS — R531 Weakness: Secondary | ICD-10-CM | POA: Diagnosis not present

## 2023-10-26 DIAGNOSIS — R5381 Other malaise: Secondary | ICD-10-CM | POA: Diagnosis not present

## 2023-10-26 DIAGNOSIS — I5031 Acute diastolic (congestive) heart failure: Secondary | ICD-10-CM | POA: Diagnosis not present

## 2023-10-26 DIAGNOSIS — I4891 Unspecified atrial fibrillation: Secondary | ICD-10-CM | POA: Diagnosis not present

## 2023-10-26 DIAGNOSIS — J9620 Acute and chronic respiratory failure, unspecified whether with hypoxia or hypercapnia: Secondary | ICD-10-CM | POA: Diagnosis not present

## 2023-10-26 DIAGNOSIS — F4321 Adjustment disorder with depressed mood: Secondary | ICD-10-CM | POA: Diagnosis not present

## 2023-10-26 DIAGNOSIS — I5033 Acute on chronic diastolic (congestive) heart failure: Secondary | ICD-10-CM | POA: Diagnosis not present

## 2023-10-27 DIAGNOSIS — F419 Anxiety disorder, unspecified: Secondary | ICD-10-CM | POA: Diagnosis not present

## 2023-10-27 DIAGNOSIS — Z515 Encounter for palliative care: Secondary | ICD-10-CM | POA: Diagnosis not present

## 2023-10-27 DIAGNOSIS — R5381 Other malaise: Secondary | ICD-10-CM | POA: Diagnosis not present

## 2023-10-27 DIAGNOSIS — R531 Weakness: Secondary | ICD-10-CM | POA: Diagnosis not present

## 2023-10-27 DIAGNOSIS — I5033 Acute on chronic diastolic (congestive) heart failure: Secondary | ICD-10-CM | POA: Diagnosis not present

## 2023-10-27 DIAGNOSIS — F4321 Adjustment disorder with depressed mood: Secondary | ICD-10-CM | POA: Diagnosis not present

## 2023-10-27 DIAGNOSIS — Z7189 Other specified counseling: Secondary | ICD-10-CM | POA: Diagnosis not present

## 2023-10-28 DIAGNOSIS — R5381 Other malaise: Secondary | ICD-10-CM | POA: Diagnosis not present

## 2023-10-28 DIAGNOSIS — F419 Anxiety disorder, unspecified: Secondary | ICD-10-CM | POA: Diagnosis not present

## 2023-10-28 DIAGNOSIS — Z7189 Other specified counseling: Secondary | ICD-10-CM | POA: Diagnosis not present

## 2023-10-28 DIAGNOSIS — F4321 Adjustment disorder with depressed mood: Secondary | ICD-10-CM | POA: Diagnosis not present

## 2023-10-28 DIAGNOSIS — Z515 Encounter for palliative care: Secondary | ICD-10-CM | POA: Diagnosis not present

## 2023-10-28 DIAGNOSIS — R531 Weakness: Secondary | ICD-10-CM | POA: Diagnosis not present

## 2023-10-28 DIAGNOSIS — I5033 Acute on chronic diastolic (congestive) heart failure: Secondary | ICD-10-CM | POA: Diagnosis not present

## 2023-10-30 ENCOUNTER — Inpatient Hospital Stay: Payer: PPO | Admitting: Internal Medicine

## 2023-10-31 ENCOUNTER — Other Ambulatory Visit: Payer: Self-pay | Admitting: Internal Medicine

## 2023-11-02 ENCOUNTER — Ambulatory Visit: Payer: PPO | Admitting: Internal Medicine

## 2023-11-04 NOTE — Discharge Summary (Signed)
 [facility]  [facility] Inpatient Discharge Summary  PCP: No primary care provider on file. Discharge Details   Admit date:         10/23/2023 Discharge date:        11/04/2023  Hospital Days:    12 days  Code Status:   Full Code Advanced Directives on file: No Directive        Discharge Diagnoses:  Principal Problem:   CHF (congestive heart failure), NYHA class II, acute, diastolic (*) Active Problems:   Essential hypertension   OSA (obstructive sleep apnea)   Restrictive lung disease   Adult hypothyroidism   Type 2 diabetes mellitus with hyperglycemia, with long-term current use of insulin  (*)   Dyslipidemia   CKD stage 3b, GFR 30-44 ml/min (*)   Morbid obesity (*)   Atrial fibrillation with rapid ventricular response (*)   Task list for follow-up: Follow-up with pulmonology clinic Follow-up with cardiology clinic   Follow-Up Appointments Suggested: Ozell Prentice Passey, PA-C [address] [place] [address] [phone]     Emil Schaumann, MD [address] [place] [address] [phone]     [facility] [address] [place]  [address] [phone]    Follow-up with Primary Care Physician   acute respiratory failure, diastolic chf, atrial fib with rvr,  Ambulatory referral to Cardiology     Ambulatory referral to Pulmonology   restrictive lung disease, hypoxia, sleep apnea  Follow-Up Appointments Already Scheduled: No future appointments.  Discharge Medications: Current Discharge Medication List     DISCONTINUED medications     b complex vitamins capsule      diltiazem  (DILT-XR,DILACOR XR ) 120 MG 24 hr capsule      HYDROcodone -acetaminophen  (NORCO) 5-325 mg per tablet      losartan  potassium (COZAAR ) 50 mg tablet      metoprolol  tartrate (LOPRESSOR ) 100 mg tablet      tirzepatide  7.5 mg/0.5 mL SOAJ       torsemide  (DEMADEX ) 20 mg tablet        NEW medications   Details  bumetanide (BUMEX) 2 mg tablet Take one tablet (2 mg dose) by mouth 2 (two) times daily for 30 days. Start date: 10/30/2023, End date: 11/29/2023    hydrALAzine HCl (APRESOLINE) 10 mg tablet Take one tablet (10 mg dose) by mouth every 8 (eight) hours for 30 days. Start date: 11/02/2023, End date: 12/02/2023    sertraline  (ZOLOFT ) 25 mg tablet Take one tablet (25 mg dose) by mouth daily. Start date: 10/31/2023       CHANGED medications   Details  apixaban  (ELIQUIS ) 2.5 mg tablet Take one tablet (2.5 mg dose) by mouth 2 (two) times daily. Start date: 11/02/2023    diltiazem  HCl (CARDIZEM  CD) 360 MG 24 hr capsule Take one capsule (360 mg dose) by mouth daily. Start date: 10/31/2023    metoprolol  succinate (TOPROL -XL) 100 mg 24 hr tablet Take one tablet (100 mg dose) by mouth 2 (two) times daily for 30 days. Start date: 11/02/2023, End date: 12/02/2023    spironolactone  (ALDACTONE ) 25 mg tablet Take one tablet (25 mg dose) by mouth daily. Start date: 10/31/2023       CONTINUED medications   Details  allopurinol  (ZYLOPRIM ) 100 mg tablet Take one half tablet (50 mg dose) by mouth daily.    ferrous sulfate 325 (65 FE) MG tablet Take one tablet (325 mg dose) by mouth every other  day.    insulin  lispro protamine-insulin  lispro (HUMALOG  MIX 75/25 KWIKPEN) (75-25) 100 UNIT/ML SUPN Inject twenty Units into the skin 2 (two) times daily before meals.    ipratropium bromide  (ATROVENT  HFA) 17 mcg/act inhaler Inhale two puffs into the lungs every 6 (six) hours as needed.    levothyroxine  sodium (SYNTHROID ,LEVOTHROID,LEVOXYL ) 112 mcg tablet Take one tablet (112 mcg dose) by mouth daily.    pantoprazole  sodium (PROTONIX ) 40 mg tablet Take one tablet (40 mg dose) by mouth 2 (two) times a day with meals.    potassium chloride  (KLOR-CON ) 20 mEq packet Take twenty mEq by mouth 2 (two) times daily.    rosuvastatin  calcium  (CRESTOR ) 10  mg tablet Take one tablet (10 mg dose) by mouth at bedtime.        Allergies: Allergies  Allergen Reactions  . Doxycycline  Unknown  . Metformin  And Related Diarrhea    Consultations this Admission: CONSULT HEART FAILURE SUPPORT (NAVIGATOR / CHF CLINIC) IP CONSULT TO [facility]ABET[facility] IP CONSULT TO CASE MANAGEMENT, RN/SW IP CONSULT TO SPIRITUAL CARE IP CONSULT TO SPIRITUAL CARE IP CONSULT TO CARDIOLOGY IP CONSULT TO CARDIOLOGY IP CONSULT TO PALLIATIVE CARE IP CONSULT TO INTENSIVIST  Procedures/Imaging:     Spirometry with Lung Volumes (PL), DLCO  Final Result  Suggestive   of restrictive lung disease, unable to diagnose without volumes with   moderate reduction in DLCO.      Echocardiogram Limited WO Enhancing Agent  Final Result  Injection of agitated saline documents no interatrial shunt.      XR Chest Ap Portable  Final Result  IMPRESSION: Worsening aeration.    Electronically Signed by: Sheppard Charm, MD on 10/26/2023 9:26 AM    Echocardiogram Limited WO Enhancing Agent  Final Result  Left Ventricle: Systolic function is normal. EF: 55-60%.  .  Left Ventricle: There is mild hypertrophy.  .  Left Ventricle: Left ventricle size is normal.  .  Left Ventricle: No regional wall motion abnormalities noted.  .  Left Atrium: Left atrium is mildly dilated at 4.400 cm.      XR Chest Ap Portable  Final Result  IMPRESSION:    Bilateral pleural effusions with diffuse bilateral infiltrates unchanged in appearance since 11/19/2023.    Electronically Signed by: Marinda Fleming, MD on 10/24/2023 6:41 AM    CT Angio Pulmonary  Final Result  IMPRESSION:    1. Negative CTA for pulmonary embolism.  2. Similar appearance to small bilateral pleural effusions with adjacent atelectasis and/or infection, bronchitis to the lower lobes, and mild pulmonary edema.  3. Cardiomegaly with calcific coronary disease.          Electronically Signed by: Reyes Luna,  MD on 10/23/2023 12:55 PM    XR Chest Ap Portable  Final Result  Impression: Increasing interstitial edema and pleural effusions compared to October 02, 2023.    Electronically Signed by: Lamar Berber IV on 10/23/2023 9:01 AM      Pertinent Labs:  Cardiac Labs: No results for input(s): CK, CKMB, CTNI, BNP in the last 168 hours. CBC: Recent Labs    Units 11/01/23 0328  WBC thou/mcL 11.8*  HGB gm/dL 8.5*  PLT thou/mcL 639   BMP: Recent Labs    Units 11/04/23 0242 11/01/23 0328 10/30/23 0253 10/29/23 0259  NA mmol/L 141 138 144 142  K mmol/L 3.5* 3.3* 3.7 3.5*  CL mmol/L 98 92* 95* 96*  CO2 mmol/L 27 37* 37* 39*  BUN mg/dL 47* 38* 36* 35*  CREATININE  mg/dL 7.95* 8.42* 8.53* 8.50*  MAGNESIUM  mg/dL 2.4 2.0  --   --    Lipid Panel: No results for input(s): CHOL, TRIG, HDL, LDL in the last 168 hours. Liver Enzymes: No results for input(s): INR, AST, ALT, ALKPHOS, BILITOT in the last 168 hours. Endocrine Panels: Recent Labs    Units 11/04/23 1159 11/04/23 0950 11/04/23 0242 11/03/23 2013 11/03/23 1721 11/03/23 1720 11/03/23 1614 11/03/23 1147 11/03/23 0748 11/02/23 2056 11/02/23 1633 11/02/23 1132  GLUCOSE mg/dL 764* 777* 808* 799* 814 168 185* 168* 165* 238* 166* 172[facility] Course   Physicians involved in care during this hospitalization Attending Provider: Therisa KANDICE Silvan, MD Attending Provider: Ricka JINNY Siren, MD Attending Provider: Margaretmary DELENA Haley, MD Attending Provider: Classie Leek, MD Admitting Provider: Ricka JINNY Siren, MD Consulting Physician: [facility] To [facility] Consulting Physician: Marcello MARLA Lennox, MD Consulting Physician: Sheena FORBES Burows, MD Consulting Physician: Toribio JINNY Domino, MD   HPI per admitting provider: 83 year old female with a history of PAF on Eliquis , HFpEF with EF of 55-60% last month, Morbid obesity w/ BMI 51, Insulin  Dependent DM type 2, Essential  HTN, HLD, CKD Stage III B, and Hypothyroidism who was previously admitted 09/22/2023 through 10/09/2023 for A-fib RVR requiring cardioversion, acute on chronic anemia not requiring transfusion, GI bleed with EGD and colonoscopy noting 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 coli UTI.  Patient was discharged to inpatient rehab and discharged yesterday.  She states that she was also seen at [facility] on 10/13/2023 where chart indicates she underwent 2 units packed red blood cells.    Patient reports compliance with all medications during rehab stay.  She was discharged to home yesterday and reports compliance with medications and CPAP upon return to home.  She states that this morning around 1 AM she awoke acutely short of breath.  She denies any chest pain but could feel her heart racing.  Denied any fever, chills, cough, nausea, vomiting, diarrhea.   As a result EMS was contacted.   ED Findings: In the emergency department patient afebrile 98.3, heart rate 140 with EKG noting A-fib RVR, respiratory rate 28-36, blood pressure 114/69, sats 94% on 3-1/2 L nasal cannula.  ABG 44.5/7.43/84.8.  Given the patient's significant work of breathing she was placed on BiPAP.  WBC 14.8, hemoglobin 9.4, platelet count 415, D-dimer 0.85, troponin 18/17, lactic acid 1.5, flu RSV and COVID-negative, glucose 244, creatinine 1.26, sodium 136, magnesium  2, proBNP 4166.  Chest x-ray notes increasing interstitial edema and pleural effusion.    ED Interventions: Patient placed on BiPAP.   Patient given Cardizem  20 mg IV x 1.  Heart rate is down into the 80s but appears to be A-fib.  Patient states that she continues to feel quite short of breath even on the BiPAP and not feeling any better since her arrival.  Hospitalist consulted for admission.   Hospital Course:    Please refer to the history and physical for details.  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  Also had recent E. coli UTI.  She was discharged to inpatient rehab, and was  just discharged to home a day prior to admission.  She reported compliance with medications and CPAP upon returning home.  However apparently woke up in the middle of the night feeling short of breath acutely.  EMS was called and patient was brought to the ED.   Patient was found to be in A-fib with heart rate in the 140s, was placed on 3 to 4 L of oxygen through nasal cannula, was placed on BiPAP due to her significant work of breathing.  Chest x-ray noted increasing interstitial edema/pleural effusions.  proBNP was 4166. Patient also received IV Cardizem  as well.   #1.  Acute on chronic diastolic CHF.  Patient was initially treated with IV Lasix , was also placed on 1800 cc fluid restriction. 2D echocardiogram was obtained, showed preserved EF of 55 to 60%, no wall motion abnormality. Seen by cardiology and palliative care in consultation.  She was continued on NIV during nights and during naps. IV Lasix  was subsequently transitioned to IV Bumex, who was also continued on hydralazine and spironolactone .  Losartan  was held possibly due to CKD. Patient appears to have diuresed well, was subsequently changed from IV to oral Bumex. There was some consideration for getting a right heart cath, however cardiology did not think it would benefit at this time. At the time of discharge, patient feels much better, peripheral edema has improved, she does not report any shortness of breath. She will be discharged on hydralazine, Toprol -XL, spironolactone , recommend outpatient follow-up with  cardiology.   #2.  Atrial fibrillation with RVR.  Patient initially required IV Cardizem  infusion, subsequently transitioned off of infusion and switched back to oral Cardizem .  However dosage was increased from 240 mg to 360 mg.  She was also continued on Toprol -XL 100 mg twice daily.  Heart rate at the time of discharge appears to be stable.  Was continued on Eliquis , dosage adjusted to 2.5 twice daily based on renal function and age. Patient had recent cardioversion about a month ago however did not sustain in sinus rhythm.  Apparently could not tolerate flecainide /dofetilide  and dronedarone  in the past.  Midodrine during last admission was also stopped due to concern for lung disease.  She has also failed ablation for A-fib but a decade ago.   #3.  Obstructive sleep apnea/chronic hypoxemic respiratory failure/possibly obesity hypoventilation. Seen by pulmonology in consultation, recommendation is to continue with NIV upon discharge at home.  Patient will need to use NIV every night as well as during naps. Case management is arranging NIV   #4.  CKD stage IIIb.  Renal function appears to be relatively stable at the time of discharge.   #5.  Chronic anemia.  Patient had transfusion in December during hospitalization.  She remains on renal dosage of Eliquis .  So far her hemoglobin has remained stable.   #6.  Leukocytosis/UTI.  Appears to be a recurrent issue for the patient.  Urine culture with E. coli and Klebsiella.  It was treated with cefepime .  She does not report any urinary symptoms at this time.   #.  Anxiety.  Treated with Zoloft .  Will be discharged on Zoloft .   #8.  Hyperlipidemia.  Continue with statin.   #9.  Debility.  Will be arranged home health services.  She was also seen by palliative care in consultation, at this time patient remains full code.    Recommendations: Follow-up with primary care physician Follow-up with cardiology outpatient clinic Follow-up with  pulmonology outpatient clinic   Primary discharge diagnosis:  Acute on chronic diastolic CHF Chronic medical conditions: Paroxysmal atrial fibrillation, diabetes mellitus, OSA, restrictive lung disease, chronic hypoxemic respiratory failure, CKD stage IIIb, hypothyroidism, dyslipidemia, hypertension    BP 143/66 (BP Location: Right Lower Arm, Patient Position: Lying)   Pulse 96   Temp 98.1 F (36.7 C) (Oral)   Resp 17   Ht 1.549 m (5' 1)   Wt 110.5 kg (243 lb 8 oz)   LMP  (LMP Unknown)   SpO2 93%   BMI 46.01 kg/m   Physical Exam HENT:     Head: Normocephalic and atraumatic.  Cardiovascular:     Heart sounds:     No friction rub. No gallop.  Pulmonary:     Effort: No respiratory distress.     Breath sounds: No stridor. No wheezing.  Skin:    General: Skin is warm.  Neurological:     General: No focal deficit present.     Mental Status: She is alert and oriented to person, place, and time.  Psychiatric:        Mood and Affect: Mood normal.     Post Hospital Care   Activity: Activity Instructions     Activity as tolerated         Weight Bearing Status:          Oxygen Orders for Discharge: O2 Device: None (Room air) SpO2: 93 %  Diet: Diet and Nourishment Orders (From admission, onward)     Start       10/30/23 0000  Consistent Carbohydrate Diet (diabetic)          10/29/23 1101  Dietary nutrition supplements Glucerna; Chocolate  With Dinner       Question Answer Comment  Select Supplement Glucerna   Select Flavor Chocolate        10/25/23 1053  Consistent Carbohydrate Carb Consistent; 1800 ML FLUID (500 ML PER TRAY)  Diet effective now       Question Answer Comment  Additional restrictions: Carb Consistent   Fluid restriction: 1800 ML FLUID (500 ML PER TRAY)                 Wound Care Recommendations:    Lines/Drains/Airways: Patient Lines/Drains/Airways Status     Active LDAs     Name Placement date Placement time Site Days    Peripheral IV 20 G Anterior;Left Forearm 10/26/23  0633  Forearm  9   Continuous Glucose Monitor Left;Posterior;Proximal;Upper 10/23/23  1115  --  12            Therapy Recommendations:  PT: Anticipated Intensity of Rehab at Next Level of Care: Multiple times per week Type: HH PT Anticipated Caregiver Needs at Next Level of Care: Intermittent assistance DME Equipment Recommendations: Has all needed equipment     AM-PAC Basic Mobility Raw Score (out of 24): 16 Routine Mobility Goal: Standing 1 OR More Minutes & Wash Face, Comb Hair, Shave, Brush Teeth 5  OT: Anticipated Intensity of Rehab at Next Level of Care: Multiple times per week Type: HH OT Anticipated Caregiver Needs at Next Level of Care: Full time indirect      SLP:              Home Health Orders: DME Orders (From admission, onward)            For Home Use Only DME Gaseous Oxygen  (Arrange for Home Use Only DME Gaseous Oxygen)  Once (Routine)       Comments: Length of need: 99  Question Answer Comment  O2 Delivery device: Nasal Cannula   O2 rate: 2 Lpm   Frequency: Continuous, stationary and portable   Oxygen Conserving Device Yes     Placed in And Linked Group             Home Health Agency             Ambulatory Referral to Home Health       Question Answer Comment  Home Health Disciplines: Physical Therapy   Home Health Disciplines: Occupational Therapy   Home Health Disciplines: Skilled Nursing   Home Health Disciplines: MSW   This certifies this patient requires services in the home due to: taxing effort to ambulate         Ambulatory Referral to Home Health  Status:  Canceled       Question Answer Comment  Home Health Disciplines: Physical Therapy   Home Health Disciplines: Occupational Therapy   Home Health Disciplines: Skilled Nursing   Home Health Disciplines: MSW   Home Health Disciplines: HHA   This certifies this patient requires services in the home due to: taxing  effort to ambulate                 I spent 35 minutes performing discharge services.   Electronically signed: Classie Leek, MD 11/04/2023 / 1:13 PM

## 2023-11-05 ENCOUNTER — Telehealth: Payer: Self-pay | Admitting: *Deleted

## 2023-11-05 NOTE — Patient Instructions (Signed)
Visit Information  Thank you for taking time to visit with me today. Please don't hesitate to contact me if I can be of assistance to you before our next scheduled telephone appointment.  Our next appointment is by telephone on Thursday 11/12/23 at 10:00 am  Please call the care guide team at [phone] if you need to cancel or reschedule your appointment.   Patient Goals/Self-Care Activities: Participate in Transition of Care Program/Attend TOC scheduled calls Take all medications as prescribed Attend all scheduled provider appointments Call provider office for new concerns or questions  Continue pacing activity to avoid episodes of shortness of breath Use assistive devices as needed to prevent falls- your walker and bedside commode Continue using the new non-invasive ventilator that you were given at the hospital before your discharge home Work with the home health team that is involved in your care- they should be contacting you soon to set up the initial visit to your home Eat a heart healthy and low salt diet If you believe your condition is getting worse- contact your care providers (doctors) promptly- reaching out to your doctor early when you have concerns can prevent you from having to go to the hospital   Following is a copy of your care plan:   Goals Addressed             This Visit's Progress    TOC 30-day Program Care Plan   On track    Current Barriers:  Medication management -- needs full medication review post-recent hospital discharge on 11/04/23: husband manages all medications- places in 7-day pill planner box; patient then takes independently; on initial TOC call, patient unable/ unwilling to complete medication review, as her husband is at work-- she is agreeable to complete at time of next scheduled call for [facility] 30-day program; on initial outreach- she denies medication concerns/ questions; states husband has reviewed new medications and she is taking all  as  prescribed Home Health services through [facility] for PT/ OT/ RN/ SW [phone]- per outside hospital notes, "Inetta Fermo")- patient has not yet heard from agency; states she spoke with agency prior to hospital discharge; outside hospital notes indicate start of services planned for Friday 11/06/23: confirmed patient has phone number for agency and encouraged her to listen out for call to initiate services- she verbalizes understanding and agreement Equipment/DME confirmed patient obtained newly ordered NIV prior to hospital discharge and is using as instructed  Multiple recent inpatient hospitalizations and SNF rehabilitation visits:  Most recent discharge on 11/04/23-- admitted to hospital from SNF/ rehabilitation facility; 12-day inpatient hospitalization  RNCM Clinical Goal(s):  Patient will work with the Care Management team over the next 30 days to address Transition of Care Barriers: Medication Management Support at home Home Health services take all medications exactly as prescribed and will call provider for medication related questions as evidenced by review of medication with TOC 30-day program RN CM  attend all scheduled medical appointments: 11/10/23- PCP: HFU; 11/11/23- endocrinology provider as evidenced by review of same with patient during TOC 30-day RN CM weekly outreaches not experience hospital admission as evidenced by review of EMR. Hospital Admissions in last 6 months = 3 inpatient admissions; 1 SNF-rehab admission  through collaboration with RN Care manager, provider, and care team.   Interventions: Evaluation of current treatment plan related to  self management and patient's adherence to plan as established by provider  Transitions of Care:  New goal. 11/05/23 Durable Medical Equipment (DME) needs assessed with patient/caregiver Doctor Visits  -  discussed the importance of doctor visits Post discharge activity limitations prescribed by provider reviewed Discussed current  clinical condition:  patient described feeling "better;" but states she is not back to her normal/ baseline; feels weak and tired post-hospital discharge 11/04/23; she denies specific clinical concerns today and sounds to be in no distress throughout entirety of TOC initial call today Reviewed multiple upcoming provider office visits: confirmed patient is aware of all and has plans to attend as scheduled Confirmed uses assistive devices on regular basis, at baseline -- walker/ bedside commode Confirmed patient received and is using as instructed new non-invasive ventilator: she reports good general understanding of use of NIV: states "it worked fine last night" Role of home health services with importance of participation/ ongoing engagement Provided education/ reinforcement around benefit of conservative activity post-hospital discharge; need to pace activity without over-doing  Reinforced/ provided education around basics of following heart healthy low salt diet  Patient Goals/Self-Care Activities: Participate in Transition of Care Program/Attend TOC scheduled calls Take all medications as prescribed Attend all scheduled provider appointments Call provider office for new concerns or questions  Continue pacing activity to avoid episodes of shortness of breath Use assistive devices as needed to prevent falls- your walker and bedside commode Continue using the new non-invasive ventilator that you were given at the hospital before your discharge home Work with the home health team that is involved in your care- they should be contacting you soon to set up the initial visit to your home Eat a heart healthy and low salt diet If you believe your condition is getting worse- contact your care providers (doctors) promptly- reaching out to your doctor early when you have concerns can prevent you from having to go to the hospital  Follow Up Plan:  Telephone follow up appointment with care management team  member scheduled for:  Thursday 11/12/23 at 10:00 am          The patient verbalized understanding of instructions, educational materials, and care plan provided today and DECLINED offer to receive copy of patient instructions, educational materials, and care plan.   Telephone follow up appointment with care management team member scheduled for:  Thursday 11/12/23 at 10:00 am  If you are experiencing a Mental Health or Behavioral Health Crisis or need someone to talk to, please  call the Suicide and Crisis Lifeline: 988 call the [place] National Suicide Prevention Lifeline: (443) 518-7770 or TTY: 703-111-8018 TTY (830) 250-5297) to talk to a trained counselor call 1-800-273-TALK (toll free, 24 hour hotline) go to [facility] [address], [place] [phone]) call the [facility] Crisis Line: [phone] call 911   Caryl Pina, RN, BSN, CCRN Alumnus RN Care Manager  Transitions of Care  [facility] - Population Health  [place] [phone]: direct office

## 2023-11-05 NOTE — Transitions of Care (Post Inpatient/ED Visit) (Signed)
11/05/2023  Name: [name] MRN: [id number] DOB: [birth year]  Today's TOC FU Call Status: Today's TOC FU Call Status:: Successful TOC FU Call Completed TOC FU Call Complete Date: 11/05/23 Patient's Name and Date of Birth confirmed.  Transition Care Management Follow-up Telephone Call Date of Discharge: 11/04/23 Discharge Facility: Other ([facility]) Name of Other ([facility]) Discharge Facility: [facility] Type of Discharge: Inpatient Admission Primary Inpatient Discharge Diagnosis:: Acute on chronic CHF; respiratory failure with hypoxia; AF/ CKD How have you been since you were released from the hospital?: Better ("I am feeling better, but I am still not too good.  I've been through so much.  My husband is at work during the day, and he fixes my medicines up for me, so I can't review them with you now.  My friend will be driving me to the doctor appointments") Any questions or concerns?: No  Items Reviewed: Did you receive and understand the discharge instructions provided?: Yes (briefly reviewed with patient who verbalizes good understanding of same - outside hospital AVS- patient does not have AVS near her for thorough review today) Medications obtained,verified, and reconciled?: No (11/05/23:  Patient declined full medication review during [facility] call; states her husband manages medications- states she has no concerns/ questions around current medications and is taking all as prescribed post-hospital discharge on 11/05/23) Medications Not Reviewed Reasons:: Other: (patient declined medication review- her husband manages her medications and he is at work- her medication list from hospital discharge is not physically near her- she agrees to full review at time of [facility] program call next week) Any new allergies since your discharge?: No Dietary orders reviewed?: Yes Type of Diet Ordered:: "Healthy as possible, diabetic, low salt, heart healthy" Do you have support at home?:  Yes People in Home: spouse Name of Support/Comfort Primary Source: Reports essentially  independent in self-care activities; supportive spouse assists as/ if needed/ indicated- but works full time during daytime hours  Medications Reviewed Today: Medications Reviewed Today     Reviewed by [name] Corner, RN (Registered Nurse) on 11/05/23 at 1128  Med List Status: <None>   Medication Order Taking? Sig Documenting Provider Last Dose Status Informant  Accu-Chek Softclix Lancets lancets [id number] No 1 each by Other route 3 (three) times daily. as directed Janeece Agee, NP Taking Active Self           Med Note [name] Corner   Thu Nov 05, 2023 11:28 AM) 11/05/23:  Patient declined full medication review during [facility] call; states her husband manages medications- states she has no concerns/ questions around current medications and is taking all as prescribed post-hospital discharge on 11/05/23   allopurinol (ZYLOPRIM) 300 MG tablet [id number] No TAKE 1 TABLET BY MOUTH DAILY Sagardia, Eilleen Kempf, MD Taking Active   amLODipine (NORVASC) 5 MG tablet [id number] No  [provider] Taking Active   azelastine (ASTELIN) 0.1 % nasal spray [id number] No 1-2 puffs each nostril at bedtime as needed Jetty Duhamel D, MD Taking Active   b complex vitamins capsule [id number] No Take 1 capsule by mouth daily. [provider] Taking Active Self  BD PEN NEEDLE NANO 2ND GEN 32G X 4 MM MISC [id number] No USE TWICE DAILY AS NEEDED Georgina Quint, MD Taking Active   blood glucose meter kit and supplies [id number] No Dispense based on patient and insurance preference. Use up to four times daily as directed. (FOR ICD-10 E10.9, E11.9). Georgina Quint, MD Taking Active Self  cholecalciferol (VITAMIN D3) 25  MCG (1000 UNIT) tablet [id number] No Take 1,000 Units by mouth daily. [provider] Taking Active Self  clobetasol cream (TEMOVATE) 0.05 % [id number] No Apply 1 Application topically 2  (two) times daily. Georgina Quint, MD Taking Active   cloNIDine (CATAPRES) 0.3 MG tablet [id number] No Take 1 tablet (0.3 mg total) by mouth 2 (two) times daily. Georgina Quint, MD Taking Active   Continuous Blood Gluc Receiver (FREESTYLE LIBRE 2 READER) [place] [id number] No Use to check blood glucose. Use as directed Georgina Quint, MD Taking Active   Continuous Glucose Sensor (FREESTYLE LIBRE 2 SENSOR) [place] [id number] No USE AS DIRECTED TO CHECK BLOOD  SUGAR DAILY [name], Konrad Dolores, MD Taking Active   dronedarone (MULTAQ) 400 MG tablet [id number]  TAKE 1 TABLET(400 MG) BY MOUTH TWICE DAILY Duke Salvia, MD  Active   ELIQUIS 5 MG TABS tablet [id number] No TAKE 1 TABLET BY MOUTH TWICE  DAILY Duke Salvia, MD Taking Active   Glucagon (GVOKE HYPOPEN 2-PACK) 0.5 MG/0.1ML SOAJ [id number] No Inject 0.5 mg into the skin daily as needed. For Hypoglycemic events Georgina Quint, MD Taking Active   hydrocortisone 2.5 % cream [id number] No APPLY TOPICALLY 2 TIMES DAILY AS NEEDED FOR Kandice Robinsons, DO Taking Active   Insulin Lispro Prot & Lispro (HUMALOG MIX 75/25 KWIKPEN) (75-25) 100 UNIT/ML Stephanie Coup [id number] No Inject 20 Units into the skin 2 (two) times daily before a meal. [name], Konrad Dolores, MD Taking Active   ipratropium (ATROVENT HFA) 17 MCG/ACT inhaler [id number] No Inhale 2 puffs into the lungs every 6 (six) hours as needed for wheezing. Jetty Duhamel D, MD Taking Expired 08/10/23 2359   ketorolac (ACULAR) 0.5 % ophthalmic solution [id number] No SMARTSIG:In Eye(s) [provider] Taking Active   levothyroxine (SYNTHROID) 112 MCG tablet [id number] No Take 1 tablet (112 mcg total) by mouth daily. Georgina Quint, MD Taking Active   lidocaine (LIDODERM) 5 % [id number] No Place 1 patch onto the skin daily. Remove & Discard patch within 12 hours or as directed by MD Kathryne Hitch, MD Taking Active Self           Med Note Wyvonnia Dusky,  Emi Belfast   Wed May 08, 2021 10:52 AM)    losartan (COZAAR) 50 MG tablet [id number] No Take 1 tablet (50 mg total) by mouth daily. [name], Konrad Dolores, MD Taking Active   metoprolol tartrate (LOPRESSOR) 25 MG tablet [id number] No Take 1 tablet (25 mg total) by mouth 2 (two) times daily. Georgina Quint, MD Taking Expired 10/24/23 2359   nystatin powder [id number] No Apply 1 Application topically 2 (two) times daily. Georgina Quint, MD Taking Active   nystatin-triamcinolone ointment [facility]) [id number] No For up to 1 week as needed for rash them discontinue. May restart as needed for flares. Avoid applying to face, groin, and axilla. Use as directed. Long-term use can cause thinning of the skin. Terri Piedra, DO Taking Active   [facility] VERIO test strip [id number] No 1 each by Other route 3 (three) times daily. [provider] Taking Active Self           Med Note Hale Bogus   Wed May 08, 2021 10:53 AM)    Discontinued 03/06/22 1303   RESTASIS MULTIDOSE 0.05 % ophthalmic emulsion [id number] No Place 1 drop into both eyes 2 (two) times daily. [provider] Taking Active Self  rosuvastatin (CRESTOR) 10 MG tablet [id number] No TAKE 1 TABLET BY  MOUTH IN THE  EVENING Sagardia, Kings, MD Taking Active   spironolactone (ALDACTONE) 25 MG tablet [id number] No Take 1 tablet (25 mg total) by mouth daily. [name], Konrad Dolores, MD Taking Active   tirzepatide [facility]) 7.5 MG/0.5ML Pen [id number] No Inject 7.5 mg into the skin once a week. [name], Konrad Dolores, MD Taking Active   TOBRADEX ophthalmic ointment [id number] No  [provider] Taking Active   torsemide (DEMADEX) 20 MG tablet [id number] No Take 1 tablet (20 mg total) by mouth daily. Please keep scheduled appointment for future refills. Thank you. Duke Salvia, MD Taking Active   triamcinolone cream (KENALOG) 0.1 % [id number] No Apply 1 application topically 2 (two) times  daily. Georgina Quint, MD Taking Active Self           Med Note Vernard Gambles   Tue Mar 12, 2021 11:12 AM)    VELTASSA 8.4 g packet [id number] No Take 1 packet by mouth daily. [provider] Taking Active            Home Care and Equipment/Supplies: Were Home Health Services Ordered?: Yes Name of Home Health Agency:: [facility]: [phone]- per discharge notes at outside hospital; disciplines include PT/ OT/ RN/ SW Has Agency set up a time to come to your home?: No (confirmed patient has contact information for home health agency; she understands to listen for their call today: per discharge notes from outside hospital- agency reports start of service date on 11/06/23) EMR reviewed for Home Health Orders: Orders present/patient has not received call (refer to CM for follow-up) (Successfully enrolled into 30-day TOC program) Any new equipment or medical supplies ordered?: Yes (NIV) Name of Medical supply agency?: [facility] Were you able to get the equipment/medical supplies?: Yes Do you have any questions related to the use of the equipment/supplies?: No  Functional Questionnaire: Do you need assistance with bathing/showering or dressing?: Yes (spouse assists/ supervises as needed) Do you need assistance with meal preparation?: Yes (spouse assists/ supervises as needed) Do you need assistance with eating?: No Do you have difficulty maintaining continence: No Do you need assistance with getting out of bed/getting out of a chair/moving?: No Do you have difficulty managing or taking your medications?: Yes (spouse assists/ supervises as needed: per patient- he prepares medications in 7 day pill planner box, she then takes independently)  Follow up appointments reviewed: PCP Follow-up appointment confirmed?: Yes Date of PCP follow-up appointment?: 11/10/23 Follow-up Provider: PCP- Dr. Alvy Bimler Specialist [facility] Follow-up appointment confirmed?: Yes Date of Specialist  follow-up appointment?: 11/11/23 Follow-Up Specialty Provider:: Endocrinology provider Do you need transportation to your follow-up appointment?: No Do you understand care options if your condition(s) worsen?: Yes-patient verbalized understanding  SDOH Interventions Today    Flowsheet Row Most Recent Value  SDOH Interventions   Food Insecurity Interventions Intervention Not Indicated  Housing Interventions Intervention Not Indicated  Transportation Interventions Intervention Not Indicated  [Reports friend "Venita Sheffield" usually provides transportation,  husband also assists as needed/ indicated]  Utilities Interventions Intervention Not Indicated       Goals Addressed             This Visit's Progress    TOC 30-day Program Care Plan   On track    Current Barriers:  Medication management -- needs full medication review post-recent hospital discharge on 11/04/23: husband manages all medications- places in 7-day pill planner box; patient then takes independently; on initial TOC call, patient unable/ unwilling to complete medication review, as her husband  is at work-- she is agreeable to complete at time of next scheduled call for [facility] 30-day program; on initial outreach- she denies medication concerns/ questions; states husband has reviewed new medications and she is taking all  as prescribed Home Health services through [facility] for PT/ OT/ RN/ SW [phone]- per outside hospital notes, "Inetta Fermo")- patient has not yet heard from agency; states she spoke with agency prior to hospital discharge; outside hospital notes indicate start of services planned for Friday 11/06/23: confirmed patient has phone number for agency and encouraged her to listen out for call to initiate services- she verbalizes understanding and agreement Equipment/DME confirmed patient obtained newly ordered NIV prior to hospital discharge and is using as instructed  Multiple recent inpatient hospitalizations and SNF  rehabilitation visits:  Most recent discharge on 11/04/23-- admitted to hospital from SNF/ rehabilitation facility; 12-day inpatient hospitalization  RNCM Clinical Goal(s):  Patient will work with the Care Management team over the next 30 days to address Transition of Care Barriers: Medication Management Support at home Home Health services take all medications exactly as prescribed and will call provider for medication related questions as evidenced by review of medication with TOC 30-day program RN CM  attend all scheduled medical appointments: 11/10/23- PCP: HFU; 11/11/23- endocrinology provider as evidenced by review of same with patient during TOC 30-day RN CM weekly outreaches not experience hospital admission as evidenced by review of EMR. Hospital Admissions in last 6 months = 3 inpatient admissions; 1 SNF-rehab admission  through collaboration with RN Care manager, provider, and care team.   Interventions: Evaluation of current treatment plan related to  self management and patient's adherence to plan as established by provider  Transitions of Care:  New goal. 11/05/23 Durable Medical Equipment (DME) needs assessed with patient/caregiver Doctor Visits  - discussed the importance of doctor visits Post discharge activity limitations prescribed by provider reviewed Discussed current clinical condition:  patient described feeling "better;" but states she is not back to her normal/ baseline; feels weak and tired post-hospital discharge 11/04/23; she denies specific clinical concerns today and sounds to be in no distress throughout entirety of TOC initial call today Reviewed multiple upcoming provider office visits: confirmed patient is aware of all and has plans to attend as scheduled Confirmed uses assistive devices on regular basis, at baseline -- walker/ bedside commode Confirmed patient received and is using as instructed new non-invasive ventilator: she reports good general understanding  of use of NIV: states "it worked fine last night" Role of home health services with importance of participation/ ongoing engagement Provided education/ reinforcement around benefit of conservative activity post-hospital discharge; need to pace activity without over-doing  Reinforced/ provided education around basics of following heart healthy low salt diet  Patient Goals/Self-Care Activities: Participate in Transition of Care Program/Attend TOC scheduled calls Take all medications as prescribed Attend all scheduled provider appointments Call provider office for new concerns or questions  Continue pacing activity to avoid episodes of shortness of breath Use assistive devices as needed to prevent falls- your walker and bedside commode Continue using the new non-invasive ventilator that you were given at the hospital before your discharge home Work with the home health team that is involved in your care- they should be contacting you soon to set up the initial visit to your home Eat a heart healthy and low salt diet If you believe your condition is getting worse- contact your care providers (doctors) promptly- reaching out to your doctor early when you have concerns can  prevent you from having to go to the hospital  Follow Up Plan:  Telephone follow up appointment with care management team member scheduled for:  Thursday 11/12/23 at 10:00 am         Caryl Pina, RN, BSN, Media planner  Transitions of Care  [facility] - [facility] [phone]: direct office

## 2023-11-06 ENCOUNTER — Telehealth: Payer: Self-pay

## 2023-11-06 ENCOUNTER — Telehealth: Payer: Self-pay | Admitting: Internal Medicine

## 2023-11-06 ENCOUNTER — Ambulatory Visit: Payer: Self-pay | Admitting: Emergency Medicine

## 2023-11-06 ENCOUNTER — Telehealth: Payer: Self-pay | Admitting: Radiology

## 2023-11-06 DIAGNOSIS — E1122 Type 2 diabetes mellitus with diabetic chronic kidney disease: Secondary | ICD-10-CM | POA: Diagnosis not present

## 2023-11-06 DIAGNOSIS — I130 Hypertensive heart and chronic kidney disease with heart failure and stage 1 through stage 4 chronic kidney disease, or unspecified chronic kidney disease: Secondary | ICD-10-CM | POA: Diagnosis not present

## 2023-11-06 DIAGNOSIS — Z794 Long term (current) use of insulin: Secondary | ICD-10-CM | POA: Diagnosis not present

## 2023-11-06 DIAGNOSIS — E1165 Type 2 diabetes mellitus with hyperglycemia: Secondary | ICD-10-CM | POA: Diagnosis not present

## 2023-11-06 DIAGNOSIS — I5032 Chronic diastolic (congestive) heart failure: Secondary | ICD-10-CM | POA: Diagnosis not present

## 2023-11-06 DIAGNOSIS — I480 Paroxysmal atrial fibrillation: Secondary | ICD-10-CM | POA: Diagnosis not present

## 2023-11-06 DIAGNOSIS — J984 Other disorders of lung: Secondary | ICD-10-CM | POA: Diagnosis not present

## 2023-11-06 DIAGNOSIS — M150 Primary generalized (osteo)arthritis: Secondary | ICD-10-CM | POA: Diagnosis not present

## 2023-11-06 DIAGNOSIS — N1832 Chronic kidney disease, stage 3b: Secondary | ICD-10-CM | POA: Diagnosis not present

## 2023-11-06 NOTE — Telephone Encounter (Signed)
Copied from CRM [id number]. Topic: Clinical - Home Health Verbal Orders >> Nov 06, 2023  2:33 PM Truddie Crumble wrote: Caller/Agency: jeff from [employer] Callback Number: [phone] Service Requested: Physical, Speech, Nursing and Occupational   Frequency: one week one, two week six Any new concerns about the patient? No  the husband asked if the pt has shortness of breath if she could use the oxyegen at home for how many liters and how often, vitals are ok and O2 is 90% after walking, contact pt husband [phone]

## 2023-11-06 NOTE — Telephone Encounter (Signed)
Called [employer] back and was not able to continue call because ID number starting with the letter "H" was not available was told someone will call back regarding this

## 2023-11-06 NOTE — Telephone Encounter (Signed)
Copied from CRM [id number]. Topic: Clinical - Medical Advice >> Nov 06, 2023  1:52 PM [name] wrote: Reason for CRM: Just got home from the hospital ELIQUIS 5 MG TABS tablet 5 mg bottle but at the hospital change it 2.5 1 tablet twice a day, hospital stated they couldn't afford to get another bottle. Wanted to know if she could cut it in half for one in the morning and one in the afternoon Physical Therapy [phone] Husband  [phone] would both like a callback with this answer    Chief Complaint:  Medication Question:Just got home from the hospital ELIQUIS 5 MG TABS tablet 5 mg bottle but at the hospital change it 2.5 1 tablet twice a day, hospital stated they couldn't afford to get another bottle. Wanted to know if she could cut it in half for one in the morning and one in the afternoon Physical Therapy [phone] Husband [phone] would both like a callback with this answer    Eliquis tablets are film-coated but can safely be cut in half. It is important to take the split tablets for your next doses, and not be stored for later use. Husband Verbalized understanding   Reason for Disposition  Caller has medicine question only, adult not sick, AND triager answers question  Answer Assessment - Initial Assessment Questions 1. NAME of MEDICINE: "What medicine(s) are you calling about?"      ELIQUIS 5 MG TABS tablet 5 mg bottle 2. QUESTION: "What is your question?" (e.g., double dose of medicine, side effect)    5 mg bottle but at the hospital change it 2.5 1 tablet twice a day, hospital stated they couldn't afford to get another bottle. Wanted to know if she could cut it in half for one in the morning and one in the afternoon  3. PRESCRIBER: "Who prescribed the medicine?" Reason: if prescribed by specialist, call should be referred to that group.     Hospital provider  Protocols used: Medication Question Call-A-AH

## 2023-11-06 NOTE — Telephone Encounter (Signed)
Spoke with patient and she was in hospital for 2 months. She is calling for appointment .

## 2023-11-06 NOTE — Telephone Encounter (Signed)
Pt is requesting a callback regarding her insurance [employer] contacting us and her wanting to go over some concerns before being discharged after speaking with her insurance herself doing a post discharge f/u . Please advise

## 2023-11-06 NOTE — Telephone Encounter (Signed)
Copied from CRM [id number]. Topic: Medical Record Request - Payor/Billing Request >> Nov 06, 2023  8:58 AM Fuller Mandril wrote: Reason for CRM: Mel from [facility] called to get verbal confirmation of Diagnosis in order to keep patient on plan. Contact Number: [phone]. Thank you

## 2023-11-09 ENCOUNTER — Ambulatory Visit: Payer: Medicare Other | Admitting: Internal Medicine

## 2023-11-09 NOTE — Telephone Encounter (Signed)
Husband was made aware.

## 2023-11-09 NOTE — Telephone Encounter (Signed)
Spoke with patient Husband and he was already made aware by another staff member of cutting pill ini half. He also had questions about her using O2 was advise to reach out to her pulmonologist. He understood with no further questions

## 2023-11-10 ENCOUNTER — Inpatient Hospital Stay: Payer: PPO | Admitting: Emergency Medicine

## 2023-11-10 ENCOUNTER — Other Ambulatory Visit: Payer: Self-pay | Admitting: Radiology

## 2023-11-10 ENCOUNTER — Ambulatory Visit: Payer: Self-pay | Admitting: Emergency Medicine

## 2023-11-10 DIAGNOSIS — E1165 Type 2 diabetes mellitus with hyperglycemia: Secondary | ICD-10-CM | POA: Diagnosis not present

## 2023-11-10 DIAGNOSIS — I5032 Chronic diastolic (congestive) heart failure: Secondary | ICD-10-CM | POA: Diagnosis not present

## 2023-11-10 DIAGNOSIS — I130 Hypertensive heart and chronic kidney disease with heart failure and stage 1 through stage 4 chronic kidney disease, or unspecified chronic kidney disease: Secondary | ICD-10-CM | POA: Diagnosis not present

## 2023-11-10 DIAGNOSIS — J984 Other disorders of lung: Secondary | ICD-10-CM | POA: Diagnosis not present

## 2023-11-10 DIAGNOSIS — N1832 Chronic kidney disease, stage 3b: Secondary | ICD-10-CM | POA: Diagnosis not present

## 2023-11-10 DIAGNOSIS — I480 Paroxysmal atrial fibrillation: Secondary | ICD-10-CM | POA: Diagnosis not present

## 2023-11-10 DIAGNOSIS — E1122 Type 2 diabetes mellitus with diabetic chronic kidney disease: Secondary | ICD-10-CM | POA: Diagnosis not present

## 2023-11-10 DIAGNOSIS — Z794 Long term (current) use of insulin: Secondary | ICD-10-CM | POA: Diagnosis not present

## 2023-11-10 DIAGNOSIS — M150 Primary generalized (osteo)arthritis: Secondary | ICD-10-CM | POA: Diagnosis not present

## 2023-11-10 NOTE — Telephone Encounter (Signed)
Copied from CRM [id number]. Topic: Clinical - Home Health Verbal Orders >> Nov 10, 2023  1:38 PM Ernst Spell wrote: Caller/Agency: Leotis Shames with [facility] Callback Number: [phone] Service Requested: Physical Therapy, Occupational Therapy, Speech Therapy, & Nursing Frequency: 1 week 1, 2 week 6 effective 1/17 for PT; the rest is for evaluation Any new concerns about the patient? Yes She had a cold on follow-up, want to evaluate her oxygen use/frequency and blood thinners

## 2023-11-10 NOTE — Telephone Encounter (Signed)
  Chief Complaint: Fall from sitting Symptoms: R knee discomfort Frequency: Around 1200 today Pertinent Negatives: Patient denies bleeding, open wound, LOC Disposition: [] ED /[] Urgent Care (no appt availability in office) / [x] Appointment(In office/virtual)/ []  [place] [facility]/ [] Home Care/ [] Refused Recommended Disposition /[] [place] [facility]/ []  Follow-up with PCP Additional Notes: Nurse Nynica from [facility] called with patient present to report a fall from sitting position today. States patient was in recliner and slid to the ground. She reports the fire department was called to assist with getting the patient up to the chair. States when patient slid she bent her R knee and patient states she is having mild discomfort in that knee now. No swelling noted per nurse. Denies LOC. Patient states that she had an appt today with PCP for hospital follow up and missed it, requests it be rescheduled during call. Per protocol, home care is appropriate but patient scheduled for next available hospital follow up and eval of knee for 11/12/23 at 1400. Care advice reviewed, patient verbalized understanding. Alerting PCP for review.   Copied from CRM [id number]. Topic: Clinical - Red Word Triage >> Nov 10, 2023 12:10 PM Gurney Maxin H wrote: [facility] that prompted transfer to Nurse Triage: Nurse calling to report patient had a fall, no injuries fire department was called to help pick patient up. Reason for Disposition  Small bruise is present  Answer Assessment - Initial Assessment Questions 1. MECHANISM: "How did the fall happen?"     Standing from recliner 2. DOMESTIC VIOLENCE AND ELDER ABUSE SCREENING: "Did you fall because someone pushed you or tried to hurt you?" If Yes, ask: "Are you safe now?"     Denies 3. ONSET: "When did the fall happen?" (e.g., minutes, hours, or days ago)     1200 today 4. LOCATION: "What part of the body hit the ground?" (e.g., back, buttocks, head, hips, knees,  hands, head, stomach)     Slide from the chair to floor, bent R knee when falling- said it is painful 5. INJURY: "Did you hurt (injure) yourself when you fell?" If Yes, ask: "What did you injure? Tell me more about this?" (e.g., body area; type of injury; pain severity)"     R knee 4/10 pain 6. PAIN: "Is there any pain?" If Yes, ask: "How bad is the pain?" (e.g., Scale 1-10; or mild,  moderate, severe)   - NONE (0): No pain   - MILD (1-3): Doesn't interfere with normal activities    - MODERATE (4-7): Interferes with normal activities or awakens from sleep    - SEVERE (8-10): Excruciating pain, unable to do any normal activities      4/10 7. SIZE: For cuts, bruises, or swelling, ask: "How large is it?" (e.g., inches or centimeters)      Denies 9. OTHER SYMPTOMS: "Do you have any other symptoms?" (e.g., dizziness, fever, weakness; new onset or worsening).      Slid, denies other symptoms 10. CAUSE: "What do you think caused the fall (or falling)?" (e.g., tripped, dizzy spell)       Slid out of chair  Protocols used: Falls and [facility]

## 2023-11-10 NOTE — Progress Notes (Deleted)
Name: [name]  Age/ Sex: 83 y.o., female   MRN/ DOB: [id number], [birth year]     PCP: Georgina Quint, MD   Reason for Endocrinology Evaluation: Type 2 Diabetes Mellitus  Initial Endocrine Consultative Visit: 06/21/2020    PATIENT IDENTIFIER: [name] is a 83 y.o. female with a past medical history of HTN, PAF, CHF, OSA and Dyslipidemia . The patient has followed with Endocrinology clinic since 06/21/2020 for consultative assistance with management of her diabetes.  DIABETIC HISTORY:  Ms. Digeronimo was diagnosed with DM in 2011,she is intolerant to metformin due to diarrhea. Her hemoglobin A1c has ranged from 7.3% in 2011, peaking at 9.5% in 2014.   ADRENAL HISTORY: She was noted with an incidental finding of right adrenal adenoma ~ 4 cm on CT imaging and 09/2010 during evaluation of abdominal pain.  This has been stable on CT imaging of abdomen 03/2020  She had normal saliva cortisol 05/2021  She had normal normetanephrine and metanephrines 01/2022   Patient has primary hyperaldosteronism with an elevated Aldo/PRA ratio of 63.6, low renin activity at 0.11 (in the setting of ARB) and a normal level aldosterone of 7.  Would not proceed with invasive testing due to advanced age and cardiovascular history We have opted to treat medically with spironolactone which was started in May 2023  SUBJECTIVE:   During the last visit (07/07/2023): A1c 8.9 %    Today (11/10/2023): [name] is here for follow-up on diabetes management.  She is accompanied by her friend.  She checks her blood sugars multiple times a day  times daily, through CGM . The patient has  not had hypoglycemic episodes since the last clinic visit, she self reduced    Pt had recent admission for CHF 10/2023  She follows with cardiology for A-fib, history of cardioversion  She denies any constipation, or diarrhea Denies nausea or vomiting  HOME ENDOCRINE REGIMEN:  Humalog Mix 20 units with  Breakfast and 20 units Supper Mounjaro 7.5 mg weekly  Spironolactone 25 mg daily  Losartan 50 mg daily       Statin: yes ACE-I/ARB: yes   CONTINUOUS GLUCOSE MONITORING RECORD INTERPRETATION    Dates of Recording: 2/10-2/23/2024  Sensor description:freestyle libre  Results statistics:   CGM use % of time 96  Average and SD 199/19  Time in range 34%  % Time Above 180 55  % Time above 250 11  % Time Below target 0     Glycemic patterns summary: BG's high during the night but increase as the day progresses Hyperglycemic episodes during the day and night but worse during the day  Hypoglycemic episodes occurred N/A  Overnight periods: high           DIABETIC COMPLICATIONS: Microvascular complications:  Neuropathy Denies: CKD, , retinopathy Last eye exam: Completed 03/30/2023   Macrovascular complications:    Denies: CAD, PVD, CVA    HISTORY:  Past Medical History:  Past Medical History:  Diagnosis Date   Arthritis    Back pain    BCC (basal cell carcinoma) 04/01/2023   right lower chin, Mohs completed on 04/21/23   Chronic anticoagulation    due to aflutter   Chronic kidney disease    Diabetes mellitus    Diastolic CHF, chronic (HCC)    a.  echo 2006 - ef 55-65%; mild diast dysfxn;    b. Echo 08/2011: Mild LVH, EF 60%;  c. 04/2013 Echo: EF 65-69%, mild conc LVH;  08/2014 Echo: EF 60-65%,  mild-mod MR.   Gout    Hyperlipidemia    Hypertension    a.  Renal arterial Dopplers 12/2011: 1-59% right renal artery stenosis   Morbid obesity (HCC)    Obstructive sleep apnea on CPAP    Paroxysmal Afib/Flutter    a. 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   Past Surgical History:  Past Surgical History:  Procedure Laterality Date   APPENDECTOMY     ATRIAL FLUTTER ABLATION N/A 09/24/2011   Procedure: ATRIAL FLUTTER ABLATION;  Surgeon: Marinus Maw, MD;  Location: [facility] CATH LAB;  Service: Cardiovascular;  Laterality: N/A;   CARDIOVERSION  10/22/2011   Procedure:  CARDIOVERSION;  Surgeon: Duke Salvia, MD;  Location: [facility] OR;  Service: Cardiovascular;  Laterality: N/A;   CARDIOVERSION N/A 09/10/2011   Procedure: CARDIOVERSION;  Surgeon: Duke Salvia, MD;  Location: [facility] CATH LAB;  Service: Cardiovascular;  Laterality: N/A;   CHOLECYSTECTOMY     COLONOSCOPY WITH PROPOFOL N/A 06/13/2021   Procedure: COLONOSCOPY WITH PROPOFOL;  Surgeon: Rachael Fee, MD;  Location: [facility] ENDOSCOPY;  Service: Endoscopy;  Laterality: N/A;   POLYPECTOMY  06/13/2021   Procedure: POLYPECTOMY;  Surgeon: Rachael Fee, MD;  Location: [facility] ENDOSCOPY;  Service: Endoscopy;;   TONSILLECTOMY  1982   TOTAL ABDOMINAL HYSTERECTOMY     Social History:  reports that she has never smoked. She has never used smokeless tobacco. She reports that she does not drink alcohol and does not use drugs. Family History:  Family History  Problem Relation Age of Onset   Heart disease Father    Hypertension Father    Breast cancer Sister    Cancer Sister        breast   Colon cancer Neg Hx    Esophageal cancer Neg Hx    Pancreatic cancer Neg Hx    Liver disease Neg Hx      HOME MEDICATIONS: Allergies as of 11/11/2023       Reactions   Doxycycline    Made patient feel very ill    Metformin Diarrhea        Medication List        Accurate as of November 10, 2023  1:31 PM. If you have any questions, ask your nurse or doctor.          Accu-Chek Softclix Lancets lancets 1 each by Other route 3 (three) times daily. as directed   allopurinol 300 MG tablet Commonly known as: ZYLOPRIM TAKE 1 TABLET BY MOUTH DAILY   amLODipine 5 MG tablet Commonly known as: NORVASC   azelastine 0.1 % nasal spray Commonly known as: ASTELIN 1-2 puffs each nostril at bedtime as needed   b complex vitamins capsule Take 1 capsule by mouth daily.   BD Pen Needle Nano 2nd Gen 32G X 4 MM Misc Generic drug: Insulin Pen Needle USE TWICE DAILY AS NEEDED   blood glucose meter kit and  supplies Dispense based on patient and insurance preference. Use up to four times daily as directed. (FOR ICD-10 E10.9, E11.9).   cholecalciferol 25 MCG (1000 UNIT) tablet Commonly known as: VITAMIN D3 Take 1,000 Units by mouth daily.   clobetasol cream 0.05 % Commonly known as: TEMOVATE Apply 1 Application topically 2 (two) times daily.   cloNIDine 0.3 MG tablet Commonly known as: CATAPRES Take 1 tablet (0.3 mg total) by mouth 2 (two) times daily.   Eliquis 5 MG Tabs tablet Generic drug: apixaban TAKE 1 TABLET BY MOUTH TWICE  DAILY   FreeStyle  Libre 2 Reader [employer] Use to check blood glucose. Use as directed   FreeStyle Libre 2 Sensor Misc USE AS DIRECTED TO CHECK BLOOD  SUGAR DAILY   Gvoke HypoPen 2-Pack 0.5 MG/0.1ML Soaj Generic drug: Glucagon Inject 0.5 mg into the skin daily as needed. For Hypoglycemic events   hydrocortisone 2.5 % cream APPLY TOPICALLY 2 TIMES DAILY AS NEEDED FOR RASH   Insulin Lispro Prot & Lispro (75-25) 100 UNIT/ML Kwikpen Commonly known as: HumaLOG Mix 75/25 KwikPen Inject 20 Units into the skin 2 (two) times daily before a meal.   ipratropium 17 MCG/ACT inhaler Commonly known as: ATROVENT HFA Inhale 2 puffs into the lungs every 6 (six) hours as needed for wheezing.   ketorolac 0.5 % ophthalmic solution Commonly known as: ACULAR SMARTSIG:In Eye(s)   levothyroxine 112 MCG tablet Commonly known as: SYNTHROID Take 1 tablet (112 mcg total) by mouth daily.   lidocaine 5 % Commonly known as: Lidoderm Place 1 patch onto the skin daily. Remove & Discard patch within 12 hours or as directed by MD   losartan 50 MG tablet Commonly known as: COZAAR Take 1 tablet (50 mg total) by mouth daily.   metoprolol tartrate 25 MG tablet Commonly known as: LOPRESSOR Take 1 tablet (25 mg total) by mouth 2 (two) times daily.   Multaq 400 MG tablet Generic drug: dronedarone TAKE 1 TABLET(400 MG) BY MOUTH TWICE DAILY   nystatin powder Commonly known as:  nystatin Apply 1 Application topically 2 (two) times daily.   nystatin-triamcinolone ointment Commonly known as: MYCOLOG For up to 1 week as needed for rash them discontinue. May restart as needed for flares. Avoid applying to face, groin, and axilla. Use as directed. Long-term use can cause thinning of the skin.   OneTouch Verio test strip Generic drug: glucose blood 1 each by Other route 3 (three) times daily.   Restasis MultiDose 0.05 % ophthalmic emulsion Generic drug: cycloSPORINE Place 1 drop into both eyes 2 (two) times daily.   rosuvastatin 10 MG tablet Commonly known as: CRESTOR TAKE 1 TABLET BY MOUTH IN THE  EVENING   spironolactone 25 MG tablet Commonly known as: Aldactone Take 1 tablet (25 mg total) by mouth daily.   tirzepatide 7.5 MG/0.5ML Pen Commonly known as: MOUNJARO Inject 7.5 mg into the skin once a week.   TobraDex ophthalmic ointment Generic drug: tobramycin-dexamethasone   torsemide 20 MG tablet Commonly known as: DEMADEX Take 1 tablet (20 mg total) by mouth daily. Please keep scheduled appointment for future refills. Thank you.   triamcinolone cream 0.1 % Commonly known as: KENALOG Apply 1 application topically 2 (two) times daily.   Veltassa 8.4 g packet Generic drug: patiromer Take 1 packet by mouth daily.         OBJECTIVE:   Vital Signs: There were no vitals taken for this visit.   Wt Readings from Last 3 Encounters:  09/22/23 260 lb (117.9 kg)  09/01/23 258 lb (117 kg)  08/10/23 260 lb (117.9 kg)     Exam: General: Pt appears well and is in NAD, in a wheelchair  Lungs: Clear with good BS bilat   Heart: RRR  Extremities: Trace  pretibial edema.   Neuro: MS is good with appropriate affect, pt is alert and Ox3    DM foot exam: 05/05/2023 per podiatry      DATA REVIEWED:  Lab Results  Component Value Date   HGBA1C 9.1 (A) 07/07/2023   HGBA1C 8.9 (A) 12/12/2022   HGBA1C 8.8 (  A) 08/13/2022    Latest Reference Range &  Units 07/07/23 11:52  Sodium 135 - 145 mEq/L 135  Potassium 3.5 - 5.1 mEq/L 4.8  Chloride 96 - 112 mEq/L 101  CO2 19 - 32 mEq/L 25  Glucose 70 - 99 mg/dL 161 (H)  BUN 6 - 23 mg/dL 31 (H)  Creatinine 0.96 - 1.20 mg/dL 0.45 (H)  Calcium 8.4 - 10.5 mg/dL 8.6  GFR >40.98 mL/min 25.57 (L)  (H): Data is abnormally high (L): Data is abnormally low  In office 324 mg/dL     ASSESSMENT / PLAN / RECOMMENDATIONS:   1) Type 2 Diabetes Mellitus, Poorly  controlled, With Neuropathic  complications - Most recent A1c of 8.9%. Goal A1c < 7.5 %.    -Patient with worsening glycemic control -Unfortunately she has self adjusted her insulin by taking insulin makes once a day instead of twice a day?,  I did discuss half-life of insulin mix and the importance of taking this twice daily, if she is not comfortable with the dosing she may reduce the dose but she should not just take it once a day as this will cause glycemic excursions. - Intolerant to Metformin  -I will increase Mounjaro as below -I will adjust her Humalog Mix dosing as below, we again emphasized the importance of taking this before breakfast and supper    MEDICATIONS:  -Take Humalog Mix 20 units with Breakfast and 20 units with Supper  -Increase  Mounjaro 7.5 mg weekly   EDUCATION / INSTRUCTIONS: BG monitoring instructions: Patient is instructed to check her blood sugars 3 times a day, before meals . Call [place] [facility] if: BG persistently < 70 I reviewed the Rule of 15 for the treatment of hypoglycemia in detail with the patient. Literature supplied.    2) Diabetic complications:  Eye: Does not have known diabetic retinopathy.  Neuro/ Feet: Does have known diabetic peripheral neuropathy .  Renal: Patient does not have known baseline CKD. She   is on an ACEI/ARB at present.   3) Right Adrenal Adenoma:   -She was diagnosed with right adrenal adenoma~4 cm on CT imaging from 2011, this has remained stable based on  CT scan 01/01/2022 at 4.3 cm  -She had normal saliva cortisol testing 05/2021, metanephrines and normetanephrine's -She did have suppressed renin in the setting of aldosterone >5 at 7 and elevated Aldo: Renin ratio     4) Primary Hyperaldosteronism:   -We have opted NOT to proceed with adrenal venous sampling and no surgical intervention given advanced age and CHF and other cardiovascular history -Renin has been normal on spironolactone 25 mg in the past, spironolactone had to be discontinued by PCP due to hypokalemia -In the setting of CHF cardiovascular disease I would prefer for the patient to remain on spironolactone and decreasing valsartan or even discontinuing this if needed in the future as opposed to discontinuing the spironolactone -BP controlled  Medication Restart spironolactone 25 mg daily Decrease Losartan to 50 mg daily  Recheck BMP in a month     F/U in 6 months    Signed electronically by: Lyndle Herrlich, MD  [facility] Endocrinology  [facility] [address] [place], [place] [address] Phone: [phone] FAX: [phone]   CC: Georgina Quint, MD [address] [place] [address] Phone: [phone]  Fax: [phone]  Return to Endocrinology clinic as below: Future Appointments  Date Time Provider Department Center  11/11/2023 12:10 PM Ladale Sherburn, Konrad Dolores, MD  [facility] None  11/12/2023 10:00 AM [name] Corner, RN [facility] None  11/12/2023  2:00 PM Georgina Quint, MD [facility] None  11/17/2023 10:20 AM Graciella Freer, PA-C [facility]  11/23/2023 10:10 AM [facility]-ANNUAL WELLNESS VISIT 2 [facility] None  01/20/2024 10:00 AM Georgina Quint, MD [facility] None

## 2023-11-11 ENCOUNTER — Ambulatory Visit: Payer: Self-pay | Admitting: Internal Medicine

## 2023-11-11 ENCOUNTER — Ambulatory Visit: Payer: Self-pay | Admitting: Emergency Medicine

## 2023-11-11 DIAGNOSIS — F32A Depression, unspecified: Secondary | ICD-10-CM | POA: Diagnosis not present

## 2023-11-11 DIAGNOSIS — I10 Essential (primary) hypertension: Secondary | ICD-10-CM | POA: Diagnosis not present

## 2023-11-11 DIAGNOSIS — E278 Other specified disorders of adrenal gland: Secondary | ICD-10-CM | POA: Diagnosis not present

## 2023-11-11 DIAGNOSIS — J811 Chronic pulmonary edema: Secondary | ICD-10-CM | POA: Diagnosis not present

## 2023-11-11 DIAGNOSIS — N136 Pyonephrosis: Secondary | ICD-10-CM | POA: Diagnosis not present

## 2023-11-11 DIAGNOSIS — R6520 Severe sepsis without septic shock: Secondary | ICD-10-CM | POA: Diagnosis not present

## 2023-11-11 DIAGNOSIS — I517 Cardiomegaly: Secondary | ICD-10-CM | POA: Diagnosis not present

## 2023-11-11 DIAGNOSIS — R0602 Shortness of breath: Secondary | ICD-10-CM | POA: Diagnosis not present

## 2023-11-11 DIAGNOSIS — I482 Chronic atrial fibrillation, unspecified: Secondary | ICD-10-CM | POA: Diagnosis not present

## 2023-11-11 DIAGNOSIS — G4733 Obstructive sleep apnea (adult) (pediatric): Secondary | ICD-10-CM | POA: Diagnosis not present

## 2023-11-11 DIAGNOSIS — D72829 Elevated white blood cell count, unspecified: Secondary | ICD-10-CM | POA: Diagnosis not present

## 2023-11-11 DIAGNOSIS — J90 Pleural effusion, not elsewhere classified: Secondary | ICD-10-CM | POA: Diagnosis not present

## 2023-11-11 DIAGNOSIS — Z515 Encounter for palliative care: Secondary | ICD-10-CM | POA: Diagnosis not present

## 2023-11-11 DIAGNOSIS — J9811 Atelectasis: Secondary | ICD-10-CM | POA: Diagnosis not present

## 2023-11-11 DIAGNOSIS — B9620 Unspecified Escherichia coli [E. coli] as the cause of diseases classified elsewhere: Secondary | ICD-10-CM | POA: Diagnosis not present

## 2023-11-11 DIAGNOSIS — R0600 Dyspnea, unspecified: Secondary | ICD-10-CM | POA: Diagnosis not present

## 2023-11-11 DIAGNOSIS — N1832 Chronic kidney disease, stage 3b: Secondary | ICD-10-CM | POA: Diagnosis not present

## 2023-11-11 DIAGNOSIS — Z7189 Other specified counseling: Secondary | ICD-10-CM | POA: Diagnosis not present

## 2023-11-11 DIAGNOSIS — J984 Other disorders of lung: Secondary | ICD-10-CM | POA: Diagnosis not present

## 2023-11-11 DIAGNOSIS — E039 Hypothyroidism, unspecified: Secondary | ICD-10-CM | POA: Diagnosis not present

## 2023-11-11 DIAGNOSIS — E1165 Type 2 diabetes mellitus with hyperglycemia: Secondary | ICD-10-CM | POA: Diagnosis not present

## 2023-11-11 DIAGNOSIS — R7989 Other specified abnormal findings of blood chemistry: Secondary | ICD-10-CM | POA: Diagnosis not present

## 2023-11-11 DIAGNOSIS — A419 Sepsis, unspecified organism: Secondary | ICD-10-CM | POA: Diagnosis not present

## 2023-11-11 DIAGNOSIS — E871 Hypo-osmolality and hyponatremia: Secondary | ICD-10-CM | POA: Diagnosis not present

## 2023-11-11 DIAGNOSIS — I5032 Chronic diastolic (congestive) heart failure: Secondary | ICD-10-CM | POA: Diagnosis not present

## 2023-11-11 DIAGNOSIS — F419 Anxiety disorder, unspecified: Secondary | ICD-10-CM | POA: Diagnosis not present

## 2023-11-11 DIAGNOSIS — Z794 Long term (current) use of insulin: Secondary | ICD-10-CM | POA: Diagnosis not present

## 2023-11-11 DIAGNOSIS — N10 Acute tubulo-interstitial nephritis: Secondary | ICD-10-CM | POA: Diagnosis not present

## 2023-11-11 DIAGNOSIS — A4151 Sepsis due to Escherichia coli [E. coli]: Secondary | ICD-10-CM | POA: Diagnosis not present

## 2023-11-11 DIAGNOSIS — I480 Paroxysmal atrial fibrillation: Secondary | ICD-10-CM | POA: Diagnosis not present

## 2023-11-11 DIAGNOSIS — R5381 Other malaise: Secondary | ICD-10-CM | POA: Diagnosis not present

## 2023-11-11 DIAGNOSIS — M150 Primary generalized (osteo)arthritis: Secondary | ICD-10-CM | POA: Diagnosis not present

## 2023-11-11 DIAGNOSIS — J9601 Acute respiratory failure with hypoxia: Secondary | ICD-10-CM | POA: Diagnosis not present

## 2023-11-11 DIAGNOSIS — J9622 Acute and chronic respiratory failure with hypercapnia: Secondary | ICD-10-CM | POA: Diagnosis not present

## 2023-11-11 DIAGNOSIS — K5730 Diverticulosis of large intestine without perforation or abscess without bleeding: Secondary | ICD-10-CM | POA: Diagnosis not present

## 2023-11-11 DIAGNOSIS — I4891 Unspecified atrial fibrillation: Secondary | ICD-10-CM | POA: Diagnosis not present

## 2023-11-11 DIAGNOSIS — E1122 Type 2 diabetes mellitus with diabetic chronic kidney disease: Secondary | ICD-10-CM | POA: Diagnosis not present

## 2023-11-11 DIAGNOSIS — R160 Hepatomegaly, not elsewhere classified: Secondary | ICD-10-CM | POA: Diagnosis not present

## 2023-11-11 DIAGNOSIS — J810 Acute pulmonary edema: Secondary | ICD-10-CM | POA: Diagnosis not present

## 2023-11-11 DIAGNOSIS — R7881 Bacteremia: Secondary | ICD-10-CM | POA: Diagnosis not present

## 2023-11-11 DIAGNOSIS — R918 Other nonspecific abnormal finding of lung field: Secondary | ICD-10-CM | POA: Diagnosis not present

## 2023-11-11 DIAGNOSIS — E785 Hyperlipidemia, unspecified: Secondary | ICD-10-CM | POA: Diagnosis not present

## 2023-11-11 DIAGNOSIS — N183 Chronic kidney disease, stage 3 unspecified: Secondary | ICD-10-CM | POA: Diagnosis not present

## 2023-11-11 DIAGNOSIS — R52 Pain, unspecified: Secondary | ICD-10-CM | POA: Diagnosis not present

## 2023-11-11 DIAGNOSIS — I5033 Acute on chronic diastolic (congestive) heart failure: Secondary | ICD-10-CM | POA: Diagnosis not present

## 2023-11-11 DIAGNOSIS — Z6841 Body Mass Index (BMI) 40.0 and over, adult: Secondary | ICD-10-CM | POA: Diagnosis not present

## 2023-11-11 DIAGNOSIS — N1330 Unspecified hydronephrosis: Secondary | ICD-10-CM | POA: Diagnosis not present

## 2023-11-11 DIAGNOSIS — I130 Hypertensive heart and chronic kidney disease with heart failure and stage 1 through stage 4 chronic kidney disease, or unspecified chronic kidney disease: Secondary | ICD-10-CM | POA: Diagnosis not present

## 2023-11-11 DIAGNOSIS — I129 Hypertensive chronic kidney disease with stage 1 through stage 4 chronic kidney disease, or unspecified chronic kidney disease: Secondary | ICD-10-CM | POA: Diagnosis not present

## 2023-11-11 DIAGNOSIS — E662 Morbid (severe) obesity with alveolar hypoventilation: Secondary | ICD-10-CM | POA: Diagnosis not present

## 2023-11-11 DIAGNOSIS — J9621 Acute and chronic respiratory failure with hypoxia: Secondary | ICD-10-CM | POA: Diagnosis not present

## 2023-11-11 DIAGNOSIS — E66813 Obesity, class 3: Secondary | ICD-10-CM | POA: Diagnosis not present

## 2023-11-11 NOTE — Telephone Encounter (Signed)
Copied from CRM [id number]. Topic: Clinical - Red Word Triage >> Nov 11, 2023  1:11 PM Chantha C wrote: [facility] that prompted transfer to Nurse Triage: Marney Doctor OT from [facility] [phone] states patient heart rate vary 156-132 while sitting for 15-20 minutes. Patient denies pain, fever, light headed, dizziness, blurry vision. Patient is breathing fine, oxygen is 97, doesn't seem to be in distressed. Blood pressure 118/70. Helmut Muster was alarmed and wanted to speak with nurse.   Chief Complaint: Tachycardia  Symptoms: Heart rate 140's-160's Frequency: Constant for 40+ minutes  Pertinent Negatives: Patient denies dizziness, blurred vision, chest pain, or other symptoms.  Disposition: [x] ED /[] Urgent Care (no appt availability in office) / [] Appointment(In office/virtual)/ []  [place] [facility]/ [] Home Care/ [] Refused Recommended Disposition /[] [place] [facility]/ []  Follow-up with PCP Additional Notes: Occupational therapy called with the patient stating that the patient's heart rate has been in the 140's-160's for about 30-40 minutes while at rest. Patient is not having any current symptoms associated with the high heart rate. I advised that the patient should go to the ED due to the elevated heart rate for evaluation and treatment. They understood and patient is agreeable with plan.    Reason for Disposition  [1] Heart beating very rapidly (e.g., > 140 / minute) AND [2] present now  (Exception: During exercise.)  Answer Assessment - Initial Assessment Questions 1. DESCRIPTION: "Please describe your heart rate or heartbeat that you are having" (e.g., fast/slow, regular/irregular, skipped or extra beats, "palpitations")     Fast heart rate  2. ONSET: "When did it start?" (Minutes, hours or days)      30 minutes  3. DURATION: "How long does it last" (e.g., seconds, minutes, hours)     Has lasted 30 minutes   4. PATTERN "Does it come and go, or has it been constant since it  started?"  "Does it get worse with exertion?"   "Are you feeling it now?"     Constant  5. TAP: "Using your hand, can you tap out what you are feeling on a chair or table in front of you, so that I can hear?" (Note: not all patients can do this)       N/A 6. HEART RATE: "Can you tell me your heart rate?" "How many beats in 15 seconds?"  (Note: not all patients can do this)       152 bpm 7. RECURRENT SYMPTOM: "Have you ever had this before?" If Yes, ask: "When was the last time?" and "What happened that time?"      Yes, had high heart rate when in hospital recently, was given medicine that helped  8. CAUSE: "What do you think is causing the palpitations?"     Unsure  9. CARDIAC HISTORY: "Do you have any history of heart disease?" (e.g., heart attack, angina, bypass surgery, angioplasty, arrhythmia)      Sees cardiologist, on Eliquis 10. OTHER SYMPTOMS: "Do you have any other symptoms?" (e.g., dizziness, chest pain, sweating, difficulty breathing)       No  Protocols used: Heart Rate and Heartbeat Questions-A-AH

## 2023-11-11 NOTE — Telephone Encounter (Signed)
Spoke with Leotis Shames from [facility] and gave okay for Verbal orders and he also wanted to confirm previous questions with O2 (2lpm of O2 to help keep sats above 90%) and okay to cut ELIQUIS 5 MG TAB in half

## 2023-11-12 ENCOUNTER — Other Ambulatory Visit: Payer: Self-pay | Admitting: *Deleted

## 2023-11-12 ENCOUNTER — Ambulatory Visit: Payer: PPO | Admitting: Emergency Medicine

## 2023-11-12 ENCOUNTER — Telehealth: Payer: Self-pay | Admitting: *Deleted

## 2023-11-12 DIAGNOSIS — I4891 Unspecified atrial fibrillation: Secondary | ICD-10-CM | POA: Diagnosis not present

## 2023-11-12 NOTE — Patient Outreach (Signed)
Care Management  Transitions of Care Program Transitions of Care Post-discharge week 2/ day # 8- patient readmission on 11/11/23   11/12/2023 Name: [name] MRN: [id number] DOB: 09/06/1941  Subjective: [name] is a 83 y.o. year old female who is a primary care patient of Sagardia, Eilleen Kempf, MD. The Care Management team reviewed patient EHR in preparation for scheduled TOC 30-day program outreach -     to assess and address transitions of care needs.   Consent to Services:  Patient was given information about care management services, agreed to services, and gave verbal consent to participate.  Enrolled into TOC 30-day program originally on 11/05/23- patient experienced hospital re-admission on day # 8 post-last hospital discharge (11/11/23)  Assessment:   11/12/23: Review of EHR in preparation for scheduled TOC 3-day program call this morning: while reviewing EHR, record updated to show current in-patient hospitalization: verified patient re-admitted to hospital at [facility] on 11/11/23 (day # 8 post-last hospital discharge): A-Fib with RVR: call deferred accordingly- will continue to follow patient for discharge disposition          SDOH Interventions    Flowsheet Row Telephone from 11/05/2023 in [place] POPULATION HEALTH DEPARTMENT Clinical Support from 11/21/2022 in [facility] at [facility] Chronic Care Management from 06/02/2022 in [facility] at [facility] Chronic Care Management from 04/08/2022 in [facility] at [facility] Chronic Care Management from 02/26/2022 in [facility] at [facility] Chronic Care Management from 01/07/2022 in [facility] at [place]  SDOH Interventions        Food Insecurity Interventions Intervention Not Indicated Intervention Not Indicated Intervention Not Indicated Intervention Not Indicated  [continues to deny food insecurity]  Intervention Not Indicated  [continues to deny food insecurity] Intervention Not Indicated  [Continues to deny food insecurity]  Housing Interventions Intervention Not Indicated Intervention Not Indicated  [continues to reside with spouse in single family home,  denies housing concerns] -- Intervention Not Indicated  [continues to reside with spouse in single family home,  denies housing concerns] Intervention Not Indicated  [continues to reside with spouse only in one level single family home,  moved into new home in [place], early in 2023- denies housing concerns today] --  Transportation Interventions Intervention Not Indicated  [Reports friend "Venita Sheffield" usually provides transportation,  husband also assists as needed/ indicated] Intervention Not Indicated  [patient reports drives self "sometimes, " also has transportation assistance as needed from spouse/ friends] Intervention Not Indicated  [patient reports drives self "sometimes, " also has transportation assistance as needed from spouse/ friends] Intervention Not Indicated  [Patient continues to drive self] Intervention Not Indicated  [Patient continues to drive self,  family/ friends also assist as needed/ indicated] Intervention Not Indicated  [Continues to drive self,  family assists as/ if indicated]  Utilities Interventions Intervention Not Indicated Intervention Not Indicated -- -- -- --  Alcohol Usage Interventions -- Intervention Not Indicated (Score <7) -- -- -- --  Financial Strain Interventions -- Intervention Not Indicated -- -- -- --  Physical Activity Interventions -- Intervention Not Indicated, Patient Refused -- -- -- --  Stress Interventions -- Intervention Not Indicated -- -- -- --  Social Connections Interventions -- Intervention Not Indicated -- -- -- --        Goals Addressed             This Visit's Progress    TOC 30-day Program Care Plan  Not on track    Current Barriers:  Medication management -- needs  full medication review post-recent hospital discharge on 11/04/23: husband manages all medications- places in 7-day pill planner box; patient then takes independently; on initial TOC call, patient unable/ unwilling to complete medication review, as her husband is at work-- she is agreeable to complete at time of next scheduled call for [facility] 30-day program; on initial outreach- she denies medication concerns/ questions; states husband has reviewed new medications and she is taking all  as prescribed Home Health services through [facility] for PT/ OT/ RN/ SW [phone]- per outside hospital notes, "Inetta Fermo")- patient has not yet heard from agency; states she spoke with agency prior to hospital discharge; outside hospital notes indicate start of services planned for Friday 11/06/23: confirmed patient has phone number for agency and encouraged her to listen out for call to initiate services- she verbalizes understanding and agreement Equipment/DME confirmed patient obtained newly ordered NIV prior to hospital discharge and is using as instructed  Multiple recent inpatient hospitalizations and SNF rehabilitation visits:  Most recent discharge on 11/04/23-- admitted to hospital from SNF/ rehabilitation facility; 12-day inpatient hospitalization 11/12/23: Review of EHR in preparation for scheduled TOC 3-day program call this morning: while reviewing EHR, record updated to show current in-patient hospitalization: verified patient re-admitted to hospital at [facility] on 11/11/23 (day # 8 post-last hospital discharge): A-Fib with RVR: call deferred accordingly- will continue to follow patient for discharge disposition  RNCM Clinical Goal(s):  Patient will work with the Care Management team over the next 30 days to address Transition of Care Barriers: Medication Management Support at home Home Health services take all medications exactly as prescribed and will call provider for medication related questions as  evidenced by review of medication with TOC 30-day program RN CM  attend all scheduled medical appointments: 11/10/23- PCP: [facility]; 11/11/23- endocrinology provider as evidenced by review of same with patient during TOC 30-day RN CM weekly outreaches not experience hospital admission as evidenced by review of EMR. Hospital Admissions in last 6 months = 3 inpatient admissions; 1 SNF-rehab admission  through collaboration with RN Care manager, provider, and care team.   Interventions: Evaluation of current treatment plan related to  self management and patient's adherence to plan as established by provider  Transitions of Care:  Goal on track:  NO. 11/12/23- verified patient re-admission on 11/11/23 Verified through review of EHR current inpatient hospitalization- re-admitted 11/11/23: will follow patient progress through review of EHR and follow up pending discharge disposition  Patient Goals/Self-Care Activities: goals per last outreach on 11/05/23: Participate in Transition of Care Program/Attend TOC scheduled calls Take all medications as prescribed Attend all scheduled provider appointments Call provider office for new concerns or questions  Continue pacing activity to avoid episodes of shortness of breath Use assistive devices as needed to prevent falls- your walker and bedside commode Continue using the new non-invasive ventilator that you were given at the hospital before your discharge home Work with the home health team that is involved in your care- they should be contacting you soon to set up the initial visit to your home Eat a heart healthy and low salt diet If you believe your condition is getting worse- contact your care providers (doctors) promptly- reaching out to your doctor early when you have concerns can prevent you from having to go to the hospital  Follow Up Plan:   will continue to follow patient through review of EHR and continue TOC outreaches pending  discharge disposition  from current inpatient hospitalization          Plan:    will continue to follow patient through review of EHR and continue TOC outreaches pending discharge disposition from current inpatient hospitalization    Caryl Pina, RN, BSN, CCRN Alumnus RN Care Manager  Transitions of Care  [facility] [phone]: direct office

## 2023-11-13 ENCOUNTER — Other Ambulatory Visit: Payer: Self-pay | Admitting: Emergency Medicine

## 2023-11-13 DIAGNOSIS — Z515 Encounter for palliative care: Secondary | ICD-10-CM | POA: Diagnosis not present

## 2023-11-13 DIAGNOSIS — N10 Acute tubulo-interstitial nephritis: Secondary | ICD-10-CM | POA: Diagnosis not present

## 2023-11-13 DIAGNOSIS — G4733 Obstructive sleep apnea (adult) (pediatric): Secondary | ICD-10-CM | POA: Diagnosis not present

## 2023-11-13 DIAGNOSIS — E039 Hypothyroidism, unspecified: Secondary | ICD-10-CM

## 2023-11-13 DIAGNOSIS — M109 Gout, unspecified: Secondary | ICD-10-CM

## 2023-11-13 DIAGNOSIS — R5381 Other malaise: Secondary | ICD-10-CM | POA: Diagnosis not present

## 2023-11-13 DIAGNOSIS — R52 Pain, unspecified: Secondary | ICD-10-CM | POA: Diagnosis not present

## 2023-11-13 DIAGNOSIS — R0602 Shortness of breath: Secondary | ICD-10-CM | POA: Diagnosis not present

## 2023-11-13 DIAGNOSIS — R6520 Severe sepsis without septic shock: Secondary | ICD-10-CM | POA: Diagnosis not present

## 2023-11-13 DIAGNOSIS — A4151 Sepsis due to Escherichia coli [E. coli]: Secondary | ICD-10-CM | POA: Diagnosis not present

## 2023-11-13 DIAGNOSIS — I5033 Acute on chronic diastolic (congestive) heart failure: Secondary | ICD-10-CM | POA: Diagnosis not present

## 2023-11-13 DIAGNOSIS — E1122 Type 2 diabetes mellitus with diabetic chronic kidney disease: Secondary | ICD-10-CM | POA: Diagnosis not present

## 2023-11-13 DIAGNOSIS — N1330 Unspecified hydronephrosis: Secondary | ICD-10-CM | POA: Diagnosis not present

## 2023-11-13 DIAGNOSIS — J984 Other disorders of lung: Secondary | ICD-10-CM | POA: Diagnosis not present

## 2023-11-13 DIAGNOSIS — I129 Hypertensive chronic kidney disease with stage 1 through stage 4 chronic kidney disease, or unspecified chronic kidney disease: Secondary | ICD-10-CM | POA: Diagnosis not present

## 2023-11-13 DIAGNOSIS — F32A Depression, unspecified: Secondary | ICD-10-CM | POA: Diagnosis not present

## 2023-11-13 DIAGNOSIS — I4891 Unspecified atrial fibrillation: Secondary | ICD-10-CM | POA: Diagnosis not present

## 2023-11-13 NOTE — Telephone Encounter (Signed)
Copied from CRM [id number]. Topic: Clinical - Medication Refill >> Nov 13, 2023  3:13 PM Drema Balzarine wrote: Most Recent Primary Care Visit:  Provider: Georgina Quint  Department: [facility]  Visit Type: HOSPITAL FU  Date: 09/01/2023  Medication: Levothyroxine, Llopurinol, Bumetanide 2mg    Has the patient contacted their pharmacy? Yes (Agent: If no, request that the patient contact the pharmacy for the refill. If patient does not wish to contact the pharmacy document the reason why and proceed with request.) (Agent: If yes, when and what did the pharmacy advise?)  Is this the correct pharmacy for this prescription? Yes If no, delete pharmacy and type the correct one.  This is the patient's preferred pharmacy:   [facility] - [place], [place] - [address] [[id number]]  Has the prescription been filled recently? Yes  Is the patient out of the medication? Yes  Has the patient been seen for an appointment in the last year OR does the patient have an upcoming appointment? Yes  Can we respond through MyChart? No  Agent: Please be advised that Rx refills may take up to 3 business days. We ask that you follow-up with your pharmacy.

## 2023-11-14 DIAGNOSIS — I4891 Unspecified atrial fibrillation: Secondary | ICD-10-CM | POA: Diagnosis not present

## 2023-11-15 DIAGNOSIS — R918 Other nonspecific abnormal finding of lung field: Secondary | ICD-10-CM | POA: Diagnosis not present

## 2023-11-15 DIAGNOSIS — J90 Pleural effusion, not elsewhere classified: Secondary | ICD-10-CM | POA: Diagnosis not present

## 2023-11-15 DIAGNOSIS — I4891 Unspecified atrial fibrillation: Secondary | ICD-10-CM | POA: Diagnosis not present

## 2023-11-15 DIAGNOSIS — I517 Cardiomegaly: Secondary | ICD-10-CM | POA: Diagnosis not present

## 2023-11-15 DIAGNOSIS — J811 Chronic pulmonary edema: Secondary | ICD-10-CM | POA: Diagnosis not present

## 2023-11-16 DIAGNOSIS — E278 Other specified disorders of adrenal gland: Secondary | ICD-10-CM | POA: Diagnosis not present

## 2023-11-16 DIAGNOSIS — F32A Depression, unspecified: Secondary | ICD-10-CM | POA: Diagnosis not present

## 2023-11-16 DIAGNOSIS — Z515 Encounter for palliative care: Secondary | ICD-10-CM | POA: Diagnosis not present

## 2023-11-16 DIAGNOSIS — Z7189 Other specified counseling: Secondary | ICD-10-CM | POA: Diagnosis not present

## 2023-11-16 DIAGNOSIS — R6520 Severe sepsis without septic shock: Secondary | ICD-10-CM | POA: Diagnosis not present

## 2023-11-16 DIAGNOSIS — A419 Sepsis, unspecified organism: Secondary | ICD-10-CM | POA: Diagnosis not present

## 2023-11-16 DIAGNOSIS — E1165 Type 2 diabetes mellitus with hyperglycemia: Secondary | ICD-10-CM | POA: Diagnosis not present

## 2023-11-16 DIAGNOSIS — J810 Acute pulmonary edema: Secondary | ICD-10-CM | POA: Diagnosis not present

## 2023-11-16 DIAGNOSIS — F419 Anxiety disorder, unspecified: Secondary | ICD-10-CM | POA: Diagnosis not present

## 2023-11-16 DIAGNOSIS — E1122 Type 2 diabetes mellitus with diabetic chronic kidney disease: Secondary | ICD-10-CM | POA: Diagnosis not present

## 2023-11-16 DIAGNOSIS — J90 Pleural effusion, not elsewhere classified: Secondary | ICD-10-CM | POA: Diagnosis not present

## 2023-11-16 DIAGNOSIS — J811 Chronic pulmonary edema: Secondary | ICD-10-CM | POA: Diagnosis not present

## 2023-11-16 DIAGNOSIS — R160 Hepatomegaly, not elsewhere classified: Secondary | ICD-10-CM | POA: Diagnosis not present

## 2023-11-16 DIAGNOSIS — K5730 Diverticulosis of large intestine without perforation or abscess without bleeding: Secondary | ICD-10-CM | POA: Diagnosis not present

## 2023-11-16 DIAGNOSIS — I5033 Acute on chronic diastolic (congestive) heart failure: Secondary | ICD-10-CM | POA: Diagnosis not present

## 2023-11-16 DIAGNOSIS — I4891 Unspecified atrial fibrillation: Secondary | ICD-10-CM | POA: Diagnosis not present

## 2023-11-16 DIAGNOSIS — J9621 Acute and chronic respiratory failure with hypoxia: Secondary | ICD-10-CM | POA: Diagnosis not present

## 2023-11-16 DIAGNOSIS — A4151 Sepsis due to Escherichia coli [E. coli]: Secondary | ICD-10-CM | POA: Diagnosis not present

## 2023-11-16 DIAGNOSIS — R0600 Dyspnea, unspecified: Secondary | ICD-10-CM | POA: Diagnosis not present

## 2023-11-16 DIAGNOSIS — R5381 Other malaise: Secondary | ICD-10-CM | POA: Diagnosis not present

## 2023-11-16 DIAGNOSIS — I517 Cardiomegaly: Secondary | ICD-10-CM | POA: Diagnosis not present

## 2023-11-16 DIAGNOSIS — I129 Hypertensive chronic kidney disease with stage 1 through stage 4 chronic kidney disease, or unspecified chronic kidney disease: Secondary | ICD-10-CM | POA: Diagnosis not present

## 2023-11-16 MED ORDER — ALLOPURINOL 300 MG PO TABS: ORAL_TABLET | ORAL | 2 refills | Status: DC

## 2023-11-16 MED ORDER — LEVOTHYROXINE SODIUM 112 MCG PO TABS: 112.0000 ug | ORAL_TABLET | Freq: Every day | ORAL | 3 refills | Status: DC

## 2023-11-17 ENCOUNTER — Ambulatory Visit: Payer: Medicare HMO | Admitting: Student

## 2023-11-17 DIAGNOSIS — I5033 Acute on chronic diastolic (congestive) heart failure: Secondary | ICD-10-CM | POA: Diagnosis not present

## 2023-11-17 DIAGNOSIS — N1330 Unspecified hydronephrosis: Secondary | ICD-10-CM | POA: Diagnosis not present

## 2023-11-17 DIAGNOSIS — E278 Other specified disorders of adrenal gland: Secondary | ICD-10-CM | POA: Diagnosis not present

## 2023-11-17 DIAGNOSIS — I10 Essential (primary) hypertension: Secondary | ICD-10-CM | POA: Diagnosis not present

## 2023-11-17 DIAGNOSIS — E1165 Type 2 diabetes mellitus with hyperglycemia: Secondary | ICD-10-CM | POA: Diagnosis not present

## 2023-11-17 DIAGNOSIS — R918 Other nonspecific abnormal finding of lung field: Secondary | ICD-10-CM | POA: Diagnosis not present

## 2023-11-17 DIAGNOSIS — B9620 Unspecified Escherichia coli [E. coli] as the cause of diseases classified elsewhere: Secondary | ICD-10-CM | POA: Diagnosis not present

## 2023-11-17 DIAGNOSIS — A4151 Sepsis due to Escherichia coli [E. coli]: Secondary | ICD-10-CM | POA: Diagnosis not present

## 2023-11-17 DIAGNOSIS — E66813 Obesity, class 3: Secondary | ICD-10-CM | POA: Diagnosis not present

## 2023-11-17 DIAGNOSIS — R7881 Bacteremia: Secondary | ICD-10-CM | POA: Diagnosis not present

## 2023-11-17 DIAGNOSIS — Z6841 Body Mass Index (BMI) 40.0 and over, adult: Secondary | ICD-10-CM | POA: Diagnosis not present

## 2023-11-17 DIAGNOSIS — N10 Acute tubulo-interstitial nephritis: Secondary | ICD-10-CM | POA: Diagnosis not present

## 2023-11-17 DIAGNOSIS — J810 Acute pulmonary edema: Secondary | ICD-10-CM | POA: Diagnosis not present

## 2023-11-17 DIAGNOSIS — J90 Pleural effusion, not elsewhere classified: Secondary | ICD-10-CM | POA: Diagnosis not present

## 2023-11-17 DIAGNOSIS — E1122 Type 2 diabetes mellitus with diabetic chronic kidney disease: Secondary | ICD-10-CM | POA: Diagnosis not present

## 2023-11-17 DIAGNOSIS — Z794 Long term (current) use of insulin: Secondary | ICD-10-CM | POA: Diagnosis not present

## 2023-11-17 DIAGNOSIS — G4733 Obstructive sleep apnea (adult) (pediatric): Secondary | ICD-10-CM | POA: Diagnosis not present

## 2023-11-17 DIAGNOSIS — J9621 Acute and chronic respiratory failure with hypoxia: Secondary | ICD-10-CM | POA: Diagnosis not present

## 2023-11-17 DIAGNOSIS — J9622 Acute and chronic respiratory failure with hypercapnia: Secondary | ICD-10-CM | POA: Diagnosis not present

## 2023-11-17 DIAGNOSIS — I4891 Unspecified atrial fibrillation: Secondary | ICD-10-CM | POA: Diagnosis not present

## 2023-11-18 DIAGNOSIS — Z7189 Other specified counseling: Secondary | ICD-10-CM | POA: Diagnosis not present

## 2023-11-18 DIAGNOSIS — E278 Other specified disorders of adrenal gland: Secondary | ICD-10-CM | POA: Diagnosis not present

## 2023-11-18 DIAGNOSIS — I4891 Unspecified atrial fibrillation: Secondary | ICD-10-CM | POA: Diagnosis not present

## 2023-11-18 DIAGNOSIS — A4151 Sepsis due to Escherichia coli [E. coli]: Secondary | ICD-10-CM | POA: Diagnosis not present

## 2023-11-18 DIAGNOSIS — F419 Anxiety disorder, unspecified: Secondary | ICD-10-CM | POA: Diagnosis not present

## 2023-11-18 DIAGNOSIS — N1330 Unspecified hydronephrosis: Secondary | ICD-10-CM | POA: Diagnosis not present

## 2023-11-18 DIAGNOSIS — F32A Depression, unspecified: Secondary | ICD-10-CM | POA: Diagnosis not present

## 2023-11-18 DIAGNOSIS — Z794 Long term (current) use of insulin: Secondary | ICD-10-CM | POA: Diagnosis not present

## 2023-11-18 DIAGNOSIS — E66813 Obesity, class 3: Secondary | ICD-10-CM | POA: Diagnosis not present

## 2023-11-18 DIAGNOSIS — J810 Acute pulmonary edema: Secondary | ICD-10-CM | POA: Diagnosis not present

## 2023-11-18 DIAGNOSIS — J9621 Acute and chronic respiratory failure with hypoxia: Secondary | ICD-10-CM | POA: Diagnosis not present

## 2023-11-18 DIAGNOSIS — J9622 Acute and chronic respiratory failure with hypercapnia: Secondary | ICD-10-CM | POA: Diagnosis not present

## 2023-11-18 DIAGNOSIS — J90 Pleural effusion, not elsewhere classified: Secondary | ICD-10-CM | POA: Diagnosis not present

## 2023-11-18 DIAGNOSIS — I10 Essential (primary) hypertension: Secondary | ICD-10-CM | POA: Diagnosis not present

## 2023-11-18 DIAGNOSIS — R5381 Other malaise: Secondary | ICD-10-CM | POA: Diagnosis not present

## 2023-11-18 DIAGNOSIS — R7881 Bacteremia: Secondary | ICD-10-CM | POA: Diagnosis not present

## 2023-11-18 DIAGNOSIS — B9620 Unspecified Escherichia coli [E. coli] as the cause of diseases classified elsewhere: Secondary | ICD-10-CM | POA: Diagnosis not present

## 2023-11-18 DIAGNOSIS — G4733 Obstructive sleep apnea (adult) (pediatric): Secondary | ICD-10-CM | POA: Diagnosis not present

## 2023-11-18 DIAGNOSIS — N10 Acute tubulo-interstitial nephritis: Secondary | ICD-10-CM | POA: Diagnosis not present

## 2023-11-18 DIAGNOSIS — E1165 Type 2 diabetes mellitus with hyperglycemia: Secondary | ICD-10-CM | POA: Diagnosis not present

## 2023-11-18 DIAGNOSIS — Z515 Encounter for palliative care: Secondary | ICD-10-CM | POA: Diagnosis not present

## 2023-11-18 DIAGNOSIS — Z6841 Body Mass Index (BMI) 40.0 and over, adult: Secondary | ICD-10-CM | POA: Diagnosis not present

## 2023-11-18 DIAGNOSIS — E1122 Type 2 diabetes mellitus with diabetic chronic kidney disease: Secondary | ICD-10-CM | POA: Diagnosis not present

## 2023-11-18 DIAGNOSIS — I5033 Acute on chronic diastolic (congestive) heart failure: Secondary | ICD-10-CM | POA: Diagnosis not present

## 2023-11-18 DIAGNOSIS — N1832 Chronic kidney disease, stage 3b: Secondary | ICD-10-CM | POA: Diagnosis not present

## 2023-11-18 DIAGNOSIS — R0600 Dyspnea, unspecified: Secondary | ICD-10-CM | POA: Diagnosis not present

## 2023-11-19 DIAGNOSIS — J9621 Acute and chronic respiratory failure with hypoxia: Secondary | ICD-10-CM | POA: Diagnosis not present

## 2023-11-19 DIAGNOSIS — Z515 Encounter for palliative care: Secondary | ICD-10-CM | POA: Diagnosis not present

## 2023-11-19 DIAGNOSIS — B9620 Unspecified Escherichia coli [E. coli] as the cause of diseases classified elsewhere: Secondary | ICD-10-CM | POA: Diagnosis not present

## 2023-11-19 DIAGNOSIS — E1165 Type 2 diabetes mellitus with hyperglycemia: Secondary | ICD-10-CM | POA: Diagnosis not present

## 2023-11-19 DIAGNOSIS — R0600 Dyspnea, unspecified: Secondary | ICD-10-CM | POA: Diagnosis not present

## 2023-11-19 DIAGNOSIS — R7881 Bacteremia: Secondary | ICD-10-CM | POA: Diagnosis not present

## 2023-11-19 DIAGNOSIS — F32A Depression, unspecified: Secondary | ICD-10-CM | POA: Diagnosis not present

## 2023-11-19 DIAGNOSIS — I5033 Acute on chronic diastolic (congestive) heart failure: Secondary | ICD-10-CM | POA: Diagnosis not present

## 2023-11-19 DIAGNOSIS — F419 Anxiety disorder, unspecified: Secondary | ICD-10-CM | POA: Diagnosis not present

## 2023-11-19 DIAGNOSIS — E66813 Obesity, class 3: Secondary | ICD-10-CM | POA: Diagnosis not present

## 2023-11-19 DIAGNOSIS — Z6841 Body Mass Index (BMI) 40.0 and over, adult: Secondary | ICD-10-CM | POA: Diagnosis not present

## 2023-11-19 DIAGNOSIS — I4891 Unspecified atrial fibrillation: Secondary | ICD-10-CM | POA: Diagnosis not present

## 2023-11-19 DIAGNOSIS — R5381 Other malaise: Secondary | ICD-10-CM | POA: Diagnosis not present

## 2023-11-19 DIAGNOSIS — J9622 Acute and chronic respiratory failure with hypercapnia: Secondary | ICD-10-CM | POA: Diagnosis not present

## 2023-11-19 DIAGNOSIS — Z7189 Other specified counseling: Secondary | ICD-10-CM | POA: Diagnosis not present

## 2023-11-19 DIAGNOSIS — G4733 Obstructive sleep apnea (adult) (pediatric): Secondary | ICD-10-CM | POA: Diagnosis not present

## 2023-11-19 DIAGNOSIS — I10 Essential (primary) hypertension: Secondary | ICD-10-CM | POA: Diagnosis not present

## 2023-11-19 DIAGNOSIS — Z794 Long term (current) use of insulin: Secondary | ICD-10-CM | POA: Diagnosis not present

## 2023-11-19 DIAGNOSIS — N1832 Chronic kidney disease, stage 3b: Secondary | ICD-10-CM | POA: Diagnosis not present

## 2023-11-20 DIAGNOSIS — Z6841 Body Mass Index (BMI) 40.0 and over, adult: Secondary | ICD-10-CM | POA: Diagnosis not present

## 2023-11-20 DIAGNOSIS — E66813 Obesity, class 3: Secondary | ICD-10-CM | POA: Diagnosis not present

## 2023-11-20 DIAGNOSIS — F32A Depression, unspecified: Secondary | ICD-10-CM | POA: Diagnosis not present

## 2023-11-20 DIAGNOSIS — I10 Essential (primary) hypertension: Secondary | ICD-10-CM | POA: Diagnosis not present

## 2023-11-20 DIAGNOSIS — I5033 Acute on chronic diastolic (congestive) heart failure: Secondary | ICD-10-CM | POA: Diagnosis not present

## 2023-11-20 DIAGNOSIS — J984 Other disorders of lung: Secondary | ICD-10-CM | POA: Diagnosis not present

## 2023-11-20 DIAGNOSIS — Z794 Long term (current) use of insulin: Secondary | ICD-10-CM | POA: Diagnosis not present

## 2023-11-20 DIAGNOSIS — R0602 Shortness of breath: Secondary | ICD-10-CM | POA: Diagnosis not present

## 2023-11-20 DIAGNOSIS — R7881 Bacteremia: Secondary | ICD-10-CM | POA: Diagnosis not present

## 2023-11-20 DIAGNOSIS — G4733 Obstructive sleep apnea (adult) (pediatric): Secondary | ICD-10-CM | POA: Diagnosis not present

## 2023-11-20 DIAGNOSIS — E1165 Type 2 diabetes mellitus with hyperglycemia: Secondary | ICD-10-CM | POA: Diagnosis not present

## 2023-11-20 DIAGNOSIS — B9620 Unspecified Escherichia coli [E. coli] as the cause of diseases classified elsewhere: Secondary | ICD-10-CM | POA: Diagnosis not present

## 2023-11-20 DIAGNOSIS — R7989 Other specified abnormal findings of blood chemistry: Secondary | ICD-10-CM | POA: Diagnosis not present

## 2023-11-20 DIAGNOSIS — R5381 Other malaise: Secondary | ICD-10-CM | POA: Diagnosis not present

## 2023-11-20 DIAGNOSIS — Z515 Encounter for palliative care: Secondary | ICD-10-CM | POA: Diagnosis not present

## 2023-11-20 DIAGNOSIS — I4891 Unspecified atrial fibrillation: Secondary | ICD-10-CM | POA: Diagnosis not present

## 2023-11-20 DIAGNOSIS — R52 Pain, unspecified: Secondary | ICD-10-CM | POA: Diagnosis not present

## 2023-11-20 DIAGNOSIS — E785 Hyperlipidemia, unspecified: Secondary | ICD-10-CM | POA: Diagnosis not present

## 2023-11-23 ENCOUNTER — Telehealth: Payer: Self-pay | Admitting: *Deleted

## 2023-11-23 ENCOUNTER — Ambulatory Visit (INDEPENDENT_AMBULATORY_CARE_PROVIDER_SITE_OTHER): Payer: Medicare HMO

## 2023-11-23 VITALS — Ht 61.0 in | Wt 260.0 lb

## 2023-11-23 DIAGNOSIS — Z599 Problem related to housing and economic circumstances, unspecified: Secondary | ICD-10-CM

## 2023-11-23 DIAGNOSIS — Z0000 Encounter for general adult medical examination without abnormal findings: Secondary | ICD-10-CM

## 2023-11-23 NOTE — Patient Instructions (Addendum)
[name] , Thank you for taking time to come for your Medicare Wellness Visit. I appreciate your ongoing commitment to your health goals. Please review the following plan we discussed and let me know if I can assist you in the future.   Referrals/Orders/Follow-Ups/Clinician Recommendations: It was nice talking with you today.  You are due for a Flu vaccine and can get that during your next office visit.  I placed a community resource referral, so someone should be calling you about getting help with some things discussed during our visit today.  This is a list of the screening recommended for you and due dates:  Health Maintenance  Topic Date Due   Zoster (Shingles) Vaccine (1 of 2) [birth year]09/1962   Yearly kidney health urinalysis for diabetes  06/21/2021   Flu Shot  01/18/2024*   Hemoglobin A1C  01/04/2024   Eye exam for diabetics  03/29/2024   Complete foot exam   05/04/2024   Yearly kidney function blood test for diabetes  07/06/2024   Medicare Annual Wellness Visit  11/22/2024   Pneumonia Vaccine  Completed   DEXA scan (bone density measurement)  Completed   HPV Vaccine  Aged Out   DTaP/Tdap/Td vaccine  Discontinued   Colon Cancer Screening  Discontinued   COVID-19 Vaccine  Discontinued  *Topic was postponed. The date shown is not the original due date.    Advanced directives: (Declined) Advance directive discussed with you today. Even though you declined this today, please call our office should you change your mind, and we can give you the proper paperwork for you to fill out.  Next Medicare Annual Wellness Visit scheduled for next year: Yes

## 2023-11-23 NOTE — Transitions of Care (Post Inpatient/ED Visit) (Signed)
11/23/2023  Name: [name] MRN: [id number] DOB: 06/19/1941  Today's TOC FU Call Status: Today's TOC FU Call Status:: Successful TOC FU Call Completed TOC FU Call Complete Date: 11/23/23 Patient's Name and Date of Birth confirmed.  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  I am going to see a heart doctor on 12/07/23- he is with [facility]; They told me at the hospital that I needed hospice, but when hospice came out this weekend, we told them no- we don't think I need hospice") Any questions or concerns?: Yes Patient Questions/Concerns:: significant confusion around medications after multiple recent hospitalizations: patient frustrated with attempt to review:  made PCP aware: medication list needs cleaning up/ clarifying- patient has appointment on 11/24/23 Patient Questions/Concerns Addressed: Notified Provider of Patient Questions/Concerns, Other: (partial medication review completed- patient/ caregiver/ spouse unable to complete over phone)  Items Reviewed: Did you receive and understand the discharge instructions provided?: Yes (briefly reviewed with patient who verbalizes good understanding of same - outside hospital AVS) Medications obtained,verified, and reconciled?: Partial Review Completed (significant confusion around medications after multiple recent hospitalizations: patient frustrated with attempt to review: made PCP aware: medication list needs cleaning up/ clarifying- patient has appointment on 11/24/23) Medications Not Reviewed Reasons:: Other: (patient/ spouse appear to be  significantly confused around medications: they decline continuing medication review half-way through review, state they will discuss with PCP tomorrow) Reason for Partial Mediation Review: significant confusion around medications after multiple recent hospitalizations: patient frustrated with attempt to review: made PCP aware: medication list needs cleaning up/ clarifying- patient has appointment on 11/24/23 Any new allergies since your discharge?: No Dietary orders reviewed?: Yes Type of Diet Ordered:: "Healthy as possible" Do you have support at home?: Yes People in Home: spouse, friend(s) Name of Support/Comfort Primary Source: Reports today that she is "much worse and weaker" than when I spoke with her on 11/05/23: states supportive spouse and church friends are assisting "as much as they can;" states husband is staying home from work "at least for now" to assist in her care; she/ spouse declined hospice services at time of initial post-hospital discharge home visit on 11/21/23  Medications Reviewed Today: Medications Reviewed Today     Reviewed by [name] Corner, RN (Registered Nurse) on 11/23/23 at 1231  Med List Status: <None>   Medication Order Taking? Sig Documenting Provider Last Dose Status Informant  Accu-Chek Softclix Lancets lancets [id number] Yes 1 each by Other route 3 (three) times daily. as directed Janeece Agee, NP Taking Active Self           Med Note Jonnie Kind Nov 05, 2023 11:28 AM) 11/05/23:  Patient declined full medication review during [facility] call; states her husband manages medications- states she has no concerns/ questions around current medications and is taking all as prescribed post-hospital discharge on 11/05/23   allopurinol (ZYLOPRIM) 300 MG tablet [id number] Yes TAKE 1 TABLET BY MOUTH DAILY  Patient taking differently: Take 100 mg by mouth daily. TAKE 1/2 tablet daily   Georgina Quint, MD Taking Active   amiodarone (PACERONE) 400 MG tablet  [id number] Yes 400 mg daily. [provider] Taking Active  amLODipine (NORVASC) 5 MG tablet [id number] No   Patient not taking: Reported on 11/23/2023   [provider] Not Taking Active            Med Note [name] Corner   Mon Nov 23, 2023 12:05 PM) 11/23/23: Reports during TOC call- not currently taking as of 11/23/23  azelastine (ASTELIN) 0.1 % nasal spray [id number] No 1-2 puffs each nostril at bedtime as needed  Patient not taking: Reported on 11/23/2023   Waymon Budge, MD Not Taking Active            Med Note Marilu Favre Nov 23, 2023 12:05 PM) 11/23/23: Reports during TOC call- not currently taking as of 11/23/23   b complex vitamins capsule [id number] No Take 1 capsule by mouth daily.  Patient not taking: Reported on 11/23/2023   [provider] Not Taking Active Self           Med Note Marilu Favre Nov 23, 2023 12:05 PM) 11/23/23: Reports during [facility] call- not currently taking as of 11/23/23   BD PEN NEEDLE NANO 2ND GEN 32G X 4 MM MISC [id number] Yes USE TWICE DAILY AS NEEDED Georgina Quint, MD Taking Active   blood glucose meter kit and supplies [id number] Yes Dispense based on patient and insurance preference. Use up to four times daily as directed. (FOR ICD-10 E10.9, E11.9). Georgina Quint, MD Taking Active Self  cefadroxil (DURICEF) 500 MG capsule [id number] Yes Take 500 mg by mouth 2 (two) times daily. [provider] Taking Active   cholecalciferol (VITAMIN D3) 25 MCG (1000 UNIT) tablet [id number] No Take 1,000 Units by mouth daily.  Patient not taking: Reported on 11/23/2023   [provider] Not Taking Active Self           Med Note Marilu Favre Nov 23, 2023 12:07 PM) 11/23/23: Reports during TOC call- not currently taking as of 11/23/23   clobetasol cream (TEMOVATE) 0.05 % [id number]  Apply 1 Application topically 2 (two) times daily.  Patient not taking: Reported on 11/23/2023   Georgina Quint, MD  Active   cloNIDine (CATAPRES) 0.3 MG tablet [id number]  Take 1 tablet (0.3 mg total) by mouth 2 (two) times daily. Georgina Quint, MD  Active   Continuous Blood Gluc Receiver (FREESTYLE LIBRE 2 READER) [place] [id number]  Use to check blood glucose. Use as directed Georgina Quint, MD  Active   Continuous Glucose Sensor (FREESTYLE LIBRE 2 SENSOR) [place] [id number]  USE AS DIRECTED TO CHECK BLOOD  SUGAR DAILY [name], Konrad Dolores, MD  Active   diltiazem (CARDIZEM CD) 180 MG 24 hr capsule [id number] Yes Take 180 mg by mouth daily. [provider] Taking Active   dronedarone (MULTAQ) 400 MG tablet [id number]  TAKE 1 TABLET(400 MG) BY MOUTH TWICE DAILY Duke Salvia, MD  Active   ELIQUIS 5 MG TABS tablet [id number]  TAKE 1 TABLET BY MOUTH TWICE  DAILY  Patient not taking: Reported on 11/23/2023   Duke Salvia, MD  Active   FEROSUL 325 (65 Fe) MG tablet [id number] Yes Take 325 mg by mouth every other day. [provider] Taking Active   Glucagon (GVOKE HYPOPEN 2-PACK) 0.5 MG/0.1ML SOAJ [id number]  Inject 0.5 mg into the skin daily as needed. For Hypoglycemic events Georgina Quint, MD  Active   hydrocortisone 2.5 % cream [id number]  APPLY TOPICALLY 2 TIMES DAILY AS  NEEDED FOR RASH  Patient not taking: Reported on 11/23/2023   Terri Piedra, DO  Active   Insulin Lispro Prot & Lispro (HUMALOG MIX 75/25 KWIKPEN) (75-25) 100 UNIT/ML Stephanie Coup [id number] Yes Inject 20 Units into the skin 2 (two) times daily before a meal. [name], Konrad Dolores, MD Taking Active   ipratropium (ATROVENT HFA) 17 MCG/ACT inhaler [id number]  Inhale 2 puffs into the lungs every 6 (six) hours as needed for wheezing. Jetty Duhamel D, MD  Expired 08/10/23 2359   ketorolac (ACULAR) 0.5 % ophthalmic solution [id number]  SMARTSIG:In Eye(s)  Patient not taking: Reported on 11/23/2023   [provider]  Active   levothyroxine (SYNTHROID) 112 MCG tablet [id number] Yes Take 1 tablet (112  mcg total) by mouth daily. Georgina Quint, MD Taking Active   lidocaine (LIDODERM) 5 % [id number]  Place 1 patch onto the skin daily. Remove & Discard patch within 12 hours or as directed by MD  Patient not taking: Reported on 11/23/2023   Kathryne Hitch, MD  Active Self           Med Note Wyvonnia Dusky, Emi [place]   Wed May 08, 2021 10:52 AM)    losartan (COZAAR) 50 MG tablet [id number]  Take 1 tablet (50 mg total) by mouth daily.  Patient not taking: Reported on 11/23/2023   [name], Konrad Dolores, MD  Active   metoprolol succinate (TOPROL-XL) 100 MG 24 hr tablet [id number] Yes Take 100 mg by mouth daily. [provider] Taking Active   metoprolol tartrate (LOPRESSOR) 25 MG tablet [id number]  Take 1 tablet (25 mg total) by mouth 2 (two) times daily. Georgina Quint, MD  Expired 10/24/23 2359   nystatin powder [id number]  Apply 1 Application topically 2 (two) times daily.  Patient not taking: Reported on 11/23/2023   Georgina Quint, MD  Active   nystatin-triamcinolone ointment [facility]) [id number]  For up to 1 week as needed for rash them discontinue. May restart as needed for flares. Avoid applying to face, groin, and axilla. Use as directed. Long-term use can cause thinning of the skin.  Patient not taking: Reported on 11/23/2023   Terri Piedra, DO  Active   [facility] VERIO test strip [id number]  1 each by Other route 3 (three) times daily.  Patient not taking: Reported on 11/23/2023   [provider]  Active Self           Med Note Ansel Bong May 08, 2021 10:53 AM)      Discontinued 03/06/22 1303   RESTASIS MULTIDOSE 0.05 % ophthalmic emulsion [id number]  Place 1 drop into both eyes 2 (two) times daily.  Patient not taking: Reported on 11/23/2023   [provider]  Active Self  rosuvastatin (CRESTOR) 10 MG tablet [id number] Yes TAKE 1 TABLET BY MOUTH IN THE  EVENING Sagardia, Eilleen Kempf, MD Taking Active   sertraline (ZOLOFT)  25 MG tablet [id number] Yes Take 25 mg by mouth daily. [provider] Taking Active   spironolactone (ALDACTONE) 25 MG tablet [id number]  Take 1 tablet (25 mg total) by mouth daily.  Patient not taking: Reported on 11/23/2023   [name], Konrad Dolores, MD  Active   tirzepatide [facility]) 7.5 MG/0.5ML Pen [id number]  Inject 7.5 mg into the skin once a week.  Patient not taking: Reported on 11/23/2023   [name], Konrad Dolores, MD  Active   [facility] ophthalmic ointment [id number]   [provider]  Active   torsemide (  DEMADEX) 20 MG tablet [id number] Yes Take 1 tablet (20 mg total) by mouth daily. Please keep scheduled appointment for future refills. Thank you. Duke Salvia, MD Taking Active   triamcinolone cream (KENALOG) 0.1 % [id number]  Apply 1 application topically 2 (two) times daily.  Patient not taking: Reported on 11/23/2023   Georgina Quint, MD  Active Self           Med Note Richardson Dopp, Forestine Na   Tue Mar 12, 2021 11:12 AM)    VELTASSA 8.4 g packet [id number]  Take 1 packet by mouth daily.  Patient not taking: Reported on 11/23/2023   [provider]  Active            Home Care and Equipment/Supplies: Were Home Health Services Ordered?: No (Hospice ordered post-hospital discharge on 11/20/23: patient declined once initial home visit made: verified with patient that no other home health services were ordered) Any new equipment or medical supplies ordered?: No  Functional Questionnaire: Do you need assistance with bathing/showering or dressing?: Yes (today, patient reports spouse is assisting as indicated; states "church friends" are also helping) Do you need assistance with meal preparation?: Yes (today, patient reports spouse is assisting as indicated; states "church friends" are also helping) Do you need assistance with eating?: No Do you have difficulty maintaining continence: Yes (today, patient reports spouse is assisting as indicated; states  "church friends" are also helping) Do you need assistance with getting out of bed/getting out of a chair/moving?: Yes (today, patient reports spouse is assisting as indicated; states "church friends" are also helping) Do you have difficulty managing or taking your medications?: Yes (today, patient reports spouse is assisting as indicated; states "church friends" are also helping)  Follow up appointments reviewed: PCP Follow-up appointment confirmed?: Yes Date of PCP follow-up appointment?: 11/24/23 Follow-up Provider: PCP- Dr. Alvy Bimler Specialist [facility] Follow-up appointment confirmed?: Yes Date of Specialist follow-up appointment?: 12/07/23 Follow-Up Specialty Provider:: Cardiology provider-- at [facility]- patient/ spouse unable to provide name of provider Do you need transportation to your follow-up appointment?: No Do you understand care options if your condition(s) worsen?: Yes-patient verbalized understanding  SDOH Interventions Today    Flowsheet Row Most Recent Value  SDOH Interventions   Food Insecurity Interventions Patient Declined, Other (Comment)  [already addressed earlier today- confirmed community resource care guide referral placed]  Housing Interventions Intervention Not Indicated  Transportation Interventions Intervention Not Indicated  [During TOC call today- patient reports husband to provide transportation moving forward]  Utilities Interventions Intervention Not Indicated       Goals Addressed             This Visit's Progress    TOC 30-day Program Care Plan   On track    Current Barriers:  Medication management -- needs full medication review post-recent hospital discharge on 11/04/23 and 11/20/23: husband manages all medications- places in 7-day pill planner box; patient then takes independently; on initial TOC call for both discharges, patient and spouse/ caregiver verbalizes SIGNIFICANT confusion around medications 11/20/23: attempted to complete medication  review: patient/ spouse became very frustrated and appear to have significant confusion around medications- post multiple recent hospitalizations; they decline further review today and state they will discuss with PCP during tomorrow's hospital follow up office visit Made PCP aware of patient's and spouse's confusion around medications Multiple recent hospitalizations: at time of most recent hospital discharge on 11/20/23: hospice recommended- patient and spouse agreed: however, when I contacted [facility]- they report patient  and spouse declined all services; husband and patient report today "they do not believe" that patient needs hospice care at this time Home Health services through [facility] for PT/ OT/ RN/ SW [phone]- per outside hospital notes, "Inetta Fermo")- previously ordered at time of hospital discharge on 11/04/23: not ordered post-hospital discharge on 11/20/23- as patient was set up with home hospice services- which she has since declined: made PCP aware of same, in cae he would like to re-order home health services 11/05/23 TOC call:  Equipment/DME confirmed patient obtained newly ordered NIV prior to hospital discharge and is using as instructed  Multiple recent inpatient hospitalizations and SNF rehabilitation visits:  Most recent discharge on 11/04/23-- admitted to hospital from SNF/ rehabilitation facility; 12-day inpatient hospitalization 11/20/23: hospital re-admission within 30 days: 1/03-15, 2025 and 01/22-31, 2025: both for VHF/ chronic respiratory failure  RNCM Clinical Goal(s):  Patient will work with the Care Management team over the next 30 days to address Transition of Care Barriers: Medication Management Support at home Home Health services take all medications exactly as prescribed and will call provider for medication related questions as evidenced by review of medication with TOC 30-day program RN CM  attend all scheduled medical appointments: 11/24/23- PCP:  [facility]; 12/07/23- [facility] cardiology provider- per patient/ spouse report provider as evidenced by review of same with patient during TOC 30-day RN CM weekly outreaches not experience hospital admission as evidenced by review of EMR. Hospital Admissions in last 6 months = 4 inpatient admissions; 1 SNF-rehab admission  through collaboration with RN Care manager, provider, and care team.   Interventions: Evaluation of current treatment plan related to  self management and patient's adherence to plan as established by provider  Transitions of Care:  New goal.  Re-enrolled into [facility] 30-day program on 11/20/23 Durable Medical Equipment (DME) needs assessed with patient/caregiver Community Resource Referral Made to address Food Insecurity Doctor Visits  - discussed the importance of doctor visits Communication with PCP re: barriers, as above re: medication confusion/ home health services in setting of refusal of hospice at home after prior agreement for hospice Confirmed AWE nurse placed referral for [facility] Guide for provision of food resources Confirmed patient is aware of/ has plans to attend as scheduled PCP HFU OV on 11/24/23- husband to transport Provided education around hospice services/ lack of home health services now that she has declined hospice: provided education around process to obtain home health services and sent PCP message to facilitate Attempted full medication reconciliation/ review-- patient and spouse only agreeable to complete partial review: they are significantly confused and then frustrated with medication review post- multiple recent hospital admissions  Patient Goals/Self-Care Activities: goals per last outreach on 11/05/23: Participate in Transition of Care Program/Attend TOC scheduled calls Take all medications as prescribed Attend all scheduled provider appointments Call provider office for new concerns or questions  Continue pacing activity to avoid  episodes of shortness of breath Use assistive devices as needed to prevent falls- your walker and bedside commode Continue using the new non-invasive ventilator that you were given at the hospital before your discharge home Eat a heart healthy and low salt diet If you believe your condition is getting worse- contact your care providers (doctors) promptly- reaching out to your doctor early when you have concerns can prevent you from having to go to the hospital  Follow Up Plan:  Telephone follow up appointment with care management team member scheduled for:  Tuesday 12/01/23 at 10:00 am  Total time spent from review to signing of note/ including any care coordination interventions: 90 minutes  Caryl Pina, RN, BSN, Media planner  Transitions of Care  [facility] [phone]: direct office

## 2023-11-23 NOTE — Progress Notes (Signed)
Subjective:   [name] is a 83 y.o. female who presents for Medicare Annual (Subsequent) preventive examination.  Visit Complete: Virtual I connected with  [name] on 11/23/23 by a audio enabled telemedicine application and verified that I am speaking with the correct person using two identifiers.  Patient Location: Home  Provider Location: Office/Clinic  I discussed the limitations of evaluation and management by telemedicine. The patient expressed understanding and agreed to proceed.  Vital Signs: Because this visit was a virtual/telehealth visit, some criteria may be missing or patient reported. Any vitals not documented were not able to be obtained and vitals that have been documented are patient reported.   Cardiac Risk Factors include: advanced age (>90men, >34 women);hypertension;diabetes mellitus;Other (see comment), Risk factor comments: CHF, A-fib, OSA     Objective:    Today's Vitals   11/23/23 0955  Weight: 260 lb (117.9 kg)  Height: 5\' 1"  (1.549 m)   Body mass index is 49.13 kg/m.     11/23/2023   10:31 AM 11/21/2022   10:19 AM 11/08/2021    2:41 PM 06/13/2021    8:50 AM 05/08/2021    7:28 AM 03/25/2021    1:00 PM 10/29/2020   10:20 AM  Advanced Directives  Does Patient Have a Medical Advance Directive? No Yes Yes No No No No  Type of Special educational needs teacher of [place];Living will Healthcare Power of Attorney      Does patient want to make changes to medical advance directive?   No - Patient declined   No - Patient declined   Copy of Healthcare Power of Attorney in Chart?  No - copy requested No - copy requested      Would patient like information on creating a medical advance directive?    No - Patient declined No - Patient declined  Yes (ED - Information included in AVS)    Current Medications (verified) Outpatient Encounter Medications as of 11/23/2023  Medication Sig   allopurinol (ZYLOPRIM) 300 MG tablet TAKE 1 TABLET BY MOUTH DAILY  (Patient taking differently: Take 100 mg by mouth daily. TAKE 1/2 tablet daily)   amiodarone (PACERONE) 400 MG tablet 400 mg daily.   cefadroxil (DURICEF) 500 MG capsule Take 500 mg by mouth 2 (two) times daily.   diltiazem (CARDIZEM CD) 180 MG 24 hr capsule Take 180 mg by mouth daily.   FEROSUL 325 (65 Fe) MG tablet Take 325 mg by mouth every other day.   levothyroxine (SYNTHROID) 112 MCG tablet Take 1 tablet (112 mcg total) by mouth daily.   metoprolol succinate (TOPROL-XL) 100 MG 24 hr tablet Take 100 mg by mouth daily.   rosuvastatin (CRESTOR) 10 MG tablet TAKE 1 TABLET BY MOUTH IN THE  EVENING   sertraline (ZOLOFT) 25 MG tablet Take 25 mg by mouth daily.   torsemide (DEMADEX) 20 MG tablet Take 1 tablet (20 mg total) by mouth daily. Please keep scheduled appointment for future refills. Thank you.   Accu-Chek Softclix Lancets lancets 1 each by Other route 3 (three) times daily. as directed   amLODipine (NORVASC) 5 MG tablet  (Patient not taking: Reported on 11/23/2023)   azelastine (ASTELIN) 0.1 % nasal spray 1-2 puffs each nostril at bedtime as needed   b complex vitamins capsule Take 1 capsule by mouth daily.   BD PEN NEEDLE NANO 2ND GEN 32G X 4 MM MISC USE TWICE DAILY AS NEEDED   blood glucose meter kit and supplies Dispense based on patient and  insurance preference. Use up to four times daily as directed. (FOR ICD-10 E10.9, E11.9).   cholecalciferol (VITAMIN D3) 25 MCG (1000 UNIT) tablet Take 1,000 Units by mouth daily.   clobetasol cream (TEMOVATE) 0.05 % Apply 1 Application topically 2 (two) times daily. (Patient not taking: Reported on 11/23/2023)   cloNIDine (CATAPRES) 0.3 MG tablet Take 1 tablet (0.3 mg total) by mouth 2 (two) times daily.   Continuous Blood Gluc Receiver (FREESTYLE LIBRE 2 READER) DEVI Use to check blood glucose. Use as directed   Continuous Glucose Sensor (FREESTYLE LIBRE 2 SENSOR) MISC USE AS DIRECTED TO CHECK BLOOD  SUGAR DAILY   dronedarone (MULTAQ) 400 MG tablet  TAKE 1 TABLET(400 MG) BY MOUTH TWICE DAILY   ELIQUIS 5 MG TABS tablet TAKE 1 TABLET BY MOUTH TWICE  DAILY (Patient not taking: Reported on 11/23/2023)   Glucagon (GVOKE HYPOPEN 2-PACK) 0.5 MG/0.1ML SOAJ Inject 0.5 mg into the skin daily as needed. For Hypoglycemic events   hydrocortisone 2.5 % cream APPLY TOPICALLY 2 TIMES DAILY AS NEEDED FOR RASH (Patient not taking: Reported on 11/23/2023)   Insulin Lispro Prot & Lispro (HUMALOG MIX 75/25 KWIKPEN) (75-25) 100 UNIT/ML Kwikpen Inject 20 Units into the skin 2 (two) times daily before a meal. (Patient not taking: Reported on 11/23/2023)   ipratropium (ATROVENT HFA) 17 MCG/ACT inhaler Inhale 2 puffs into the lungs every 6 (six) hours as needed for wheezing.   ketorolac (ACULAR) 0.5 % ophthalmic solution SMARTSIG:In Eye(s) (Patient not taking: Reported on 11/23/2023)   lidocaine (LIDODERM) 5 % Place 1 patch onto the skin daily. Remove & Discard patch within 12 hours or as directed by MD (Patient not taking: Reported on 11/23/2023)   losartan (COZAAR) 50 MG tablet Take 1 tablet (50 mg total) by mouth daily. (Patient not taking: Reported on 11/23/2023)   metoprolol tartrate (LOPRESSOR) 25 MG tablet Take 1 tablet (25 mg total) by mouth 2 (two) times daily.   nystatin powder Apply 1 Application topically 2 (two) times daily. (Patient not taking: Reported on 11/23/2023)   nystatin-triamcinolone ointment (MYCOLOG) For up to 1 week as needed for rash them discontinue. May restart as needed for flares. Avoid applying to face, groin, and axilla. Use as directed. Long-term use can cause thinning of the skin. (Patient not taking: Reported on 11/23/2023)   ONETOUCH VERIO test strip 1 each by Other route 3 (three) times daily. (Patient not taking: Reported on 11/23/2023)   RESTASIS MULTIDOSE 0.05 % ophthalmic emulsion Place 1 drop into both eyes 2 (two) times daily. (Patient not taking: Reported on 11/23/2023)   spironolactone (ALDACTONE) 25 MG tablet Take 1 tablet (25 mg total) by mouth  daily. (Patient not taking: Reported on 11/23/2023)   tirzepatide [facility]) 7.5 MG/0.5ML Pen Inject 7.5 mg into the skin once a week. (Patient not taking: Reported on 11/23/2023)   TOBRADEX ophthalmic ointment    triamcinolone cream (KENALOG) 0.1 % Apply 1 application topically 2 (two) times daily. (Patient not taking: Reported on 11/23/2023)   VELTASSA 8.4 g packet Take 1 packet by mouth daily. (Patient not taking: Reported on 11/23/2023)   [DISCONTINUED] potassium chloride (KLOR-CON) 10 MEQ tablet Take 4 tablets (40 mEq total) by mouth daily.   No facility-administered encounter medications on file as of 11/23/2023.    Allergies (verified) Doxycycline and Metformin   History: Past Medical History:  Diagnosis Date   Arthritis    Back pain    BCC (basal cell carcinoma) 04/01/2023   right lower chin, Mohs completed  on 04/21/23   Chronic anticoagulation    due to aflutter   Chronic kidney disease    Diabetes mellitus    Diastolic CHF, chronic (HCC)    a.  echo 2006 - ef 55-65%; mild diast dysfxn;    b. Echo 08/2011: Mild LVH, EF 60%;  c. 04/2013 Echo: EF 65-69%, mild conc LVH;  08/2014 Echo: EF 60-65%, mild-mod MR.   Gout    Hyperlipidemia    Hypertension    a.  Renal arterial Dopplers 12/2011: 1-59% right renal artery stenosis   Morbid obesity (HCC)    Obstructive sleep apnea on CPAP    Paroxysmal Afib/Flutter    a. 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   Past Surgical History:  Procedure Laterality Date   APPENDECTOMY     ATRIAL FLUTTER ABLATION N/A 09/24/2011   Procedure: ATRIAL FLUTTER ABLATION;  Surgeon: Marinus Maw, MD;  Location: [facility] CATH LAB;  Service: Cardiovascular;  Laterality: N/A;   CARDIOVERSION  10/22/2011   Procedure: CARDIOVERSION;  Surgeon: Duke Salvia, MD;  Location[facility] OR;  Service: Cardiovascular;  Laterality: N/A;   CARDIOVERSION N/A 09/10/2011   Procedure: CARDIOVERSION;  Surgeon: Duke Salvia, MD;  Location: [facility] CATH LAB;  Service: Cardiovascular;  Laterality:  N/A;   CHOLECYSTECTOMY     COLONOSCOPY WITH PROPOFOL N/A 06/13/2021   Procedure: COLONOSCOPY WITH PROPOFOL;  Surgeon: Rachael Fee, MD;  Location: [facility] ENDOSCOPY;  Service: Endoscopy;  Laterality: N/A;   POLYPECTOMY  06/13/2021   Procedure: POLYPECTOMY;  Surgeon: Rachael Fee, MD;  Location: [facility] ENDOSCOPY;  Service: Endoscopy;;   TONSILLECTOMY  1982   TOTAL ABDOMINAL HYSTERECTOMY     Family History  Problem Relation Age of Onset   Heart disease Father    Hypertension Father    Breast cancer Sister    Cancer Sister        breast   Colon cancer Neg Hx    Esophageal cancer Neg Hx    Pancreatic cancer Neg Hx    Liver disease Neg Hx    Social History   Socioeconomic History   Marital status: Married    Spouse name: Not on file   Number of children: 3   Years of education: Not on file   Highest education level: Not on file  Occupational History   Occupation: DISABILITY/housewife    Employer: RETIRED  Tobacco Use   Smoking status: Never   Smokeless tobacco: Never  Vaping Use   Vaping status: Never Used  Substance and Sexual Activity   Alcohol use: No   Drug use: No   Sexual activity: Yes  Other Topics Concern   Not on file  Social History Narrative   Lives with husband.   Social Drivers of Corporate investment banker Strain: High Risk (11/23/2023)   Overall Financial Resource Strain (CARDIA)    Difficulty of Paying Living Expenses: Very hard  Food Insecurity: Food Insecurity Present (11/23/2023)   Hunger Vital Sign    Worried About Running Out of Food in the Last Year: Often true    Ran Out of Food in the Last Year: Often true  Transportation Needs: Unmet Transportation Needs (11/23/2023)   PRAPARE - Administrator, Civil Service (Medical): Yes    Lack of Transportation (Non-Medical): Yes  Physical Activity: Inactive (11/23/2023)   Exercise Vital Sign    Days of Exercise per Week: 0 days    Minutes of Exercise per Session: 0 min  Stress: No Stress Concern  Present (11/23/2023)   [employer] of Occupational Health - Occupational Stress Questionnaire    Feeling of Stress : Not at all  Social Connections: Socially Integrated (11/23/2023)   Social Connection and Isolation Panel [NHANES]    Frequency of Communication with Friends and Family: More than three times a week    Frequency of Social Gatherings with Friends and Family: More than three times a week    Attends Religious Services: More than 4 times per year    Active Member of [employer] or Organizations: Yes    Attends Banker Meetings: Never    Marital Status: Married    Tobacco Counseling Counseling given: Not Answered   Clinical Intake:  Pre-visit preparation completed: Yes  Pain : No/denies pain     BMI - recorded: 49.13 Nutritional Status: BMI > 30  Obese Nutritional Risks: None Diabetes: Yes CBG done?: Yes (168) CBG resulted in Enter/ Edit results?: No Did pt. bring in CBG monitor from home?: No  How often do you need to have someone help you when you read instructions, pamphlets, or other written materials from your doctor or pharmacy?: 1 - Never  Interpreter Needed?: No  Information entered by :: Bralee Feldt, RMA   Activities of Daily Living    11/23/2023   10:13 AM  In your present state of health, do you have any difficulty performing the following activities:  Hearing? 0  Vision? 0  Difficulty concentrating or making decisions? 0  Walking or climbing stairs? 0  Dressing or bathing? 1  Doing errands, shopping? 1  Comment not able to drive/ need tranportation  Preparing Food and eating ? N  Using the Toilet? N  In the past six months, have you accidently leaked urine? N  Do you have problems with loss of bowel control? N  Managing your Medications? N  Managing your Finances? N  Housekeeping or managing your Housekeeping? N    Patient Care Team: Georgina Quint, MD as PCP - General (Internal Medicine) Duke Salvia, MD as PCP -  Electrophysiology (Cardiology) Sherren Mocha, MD as Resident (Family Medicine) Szabat, Vinnie Level, [facility] (Inactive) as Pharmacist (Pharmacist) Aura Camps, MD as Consulting Physician (Ophthalmology) [facility], [place] [name] Corner, RN as [facility] Care Management  Indicate any recent Medical Services you may have received from other than [facility] providers in the past year (date may be approximate).     Assessment:   This is a routine wellness examination for Carolene.  Hearing/Vision screen Hearing Screening - Comments:: Denies hearing difficulties   Vision Screening - Comments:: Wears eyeglasses.   Goals Addressed   None   Depression Screen    11/23/2023   10:33 AM 09/01/2023   11:09 AM 08/10/2023   11:01 AM 07/22/2023    9:56 AM 05/27/2023    1:28 PM 03/09/2023    3:58 PM 02/09/2023    3:59 PM  PHQ 2/9 Scores  PHQ - 2 Score 0 0 0 0 0 0 0  PHQ- 9 Score 0   0  0     Fall Risk    11/23/2023   10:48 AM 09/01/2023   11:09 AM 07/22/2023    9:56 AM 05/27/2023    1:28 PM 01/28/2023    1:51 PM  Fall Risk   Falls in the past year? 0 0 0 0 0  Number falls in past yr: 0 0 0 0 0  Injury with Fall? 0 0 0 0 0  Risk  for fall due to : No Fall Risks No Fall Risks No Fall Risks No Fall Risks No Fall Risks  Follow up Falls prevention discussed;Falls evaluation completed Falls evaluation completed Falls evaluation completed Falls evaluation completed Falls evaluation completed    MEDICARE RISK AT HOME: Medicare Risk at Home Any stairs in or around the home?: Yes If so, are there any without handrails?: No Home free of loose throw rugs in walkways, pet beds, electrical cords, etc?: Yes Adequate lighting in your home to reduce risk of falls?: Yes Life alert?: No Use of a cane, walker or w/c?: Yes (walker) Grab bars in the bathroom?: Yes Shower chair or bench in shower?: No Elevated toilet seat or a handicapped toilet?: Yes  TIMED UP AND GO:  Was the test performed?  No     Cognitive Function:        11/23/2023   10:31 AM 11/21/2022   10:31 AM 10/29/2020   10:02 AM  6CIT Screen  What Year? 0 points 0 points 0 points  What month? 0 points 0 points 0 points  What time? 0 points 0 points 0 points  Count back from 20 0 points 0 points 0 points  Months in reverse 0 points 0 points 0 points  Repeat phrase 0 points 0 points 0 points  Total Score 0 points 0 points 0 points    Immunizations Immunization History  Administered Date(s) Administered   Fluad Quad(high Dose 65+) 12/31/2015   Influenza Split 09/20/2011, 07/13/2012, 12/31/2015   Influenza, High Dose Seasonal PF 08/07/2016, 08/04/2017, 07/21/2018, 07/22/2019   Influenza,inj,Quad PF,6+ Mos 10/04/2013   Influenza,inj,quad, With Preservative 10/04/2013   Influenza,trivalent, recombinat, inj, PF 09/20/2011, 07/13/2012, 12/31/2015   Influenza-Unspecified 09/20/2011, 07/13/2012, 10/04/2013, 08/07/2016   Pneumococcal Conjugate PCV 7 07/15/2012   Pneumococcal Conjugate-13 07/15/2012, 05/11/2014   Pneumococcal Polysaccharide-23 07/13/2012   Pneumococcal-Unspecified 07/15/2012   Zoster, Live 07/29/2012    TDAP status: Due, Education has been provided regarding the importance of this vaccine. Advised may receive this vaccine at local pharmacy or Health Dept. Aware to provide a copy of the vaccination record if obtained from local pharmacy or Health Dept. Verbalized acceptance and understanding.  Flu Vaccine status: Due, Education has been provided regarding the importance of this vaccine. Advised may receive this vaccine at local pharmacy or Health Dept. Aware to provide a copy of the vaccination record if obtained from local pharmacy or Health Dept. Verbalized acceptance and understanding.  Pneumococcal vaccine status: Up to date  Covid-19 vaccine status: Declined, Education has been provided regarding the importance of this vaccine but patient still declined. Advised may receive this vaccine at local  pharmacy or Health Dept.or vaccine clinic. Aware to provide a copy of the vaccination record if obtained from local pharmacy or Health Dept. Verbalized acceptance and understanding.  Qualifies for Shingles Vaccine? Yes   Zostavax completed Yes   Shingrix Completed?: No.    Education has been provided regarding the importance of this vaccine. Patient has been advised to call insurance company to determine out of pocket expense if they have not yet received this vaccine. Advised may also receive vaccine at local pharmacy or Health Dept. Verbalized acceptance and understanding.  Screening Tests Health Maintenance  Topic Date Due   Zoster Vaccines- Shingrix (1 of 2) 10/09/1960   Diabetic kidney evaluation - Urine ACR  06/21/2021   INFLUENZA VACCINE  01/18/2024 (Originally 05/21/2023)   HEMOGLOBIN A1C  01/04/2024   OPHTHALMOLOGY EXAM  03/29/2024   FOOT EXAM  05/04/2024  Diabetic kidney evaluation - eGFR measurement  07/06/2024   Medicare Annual Wellness (AWV)  11/22/2024   Pneumonia Vaccine 19+ Years old  Completed   DEXA SCAN  Completed   HPV VACCINES  Aged Out   DTaP/Tdap/Td  Discontinued   Colonoscopy  Discontinued   COVID-19 Vaccine  Discontinued    Health Maintenance  Health Maintenance Due  Topic Date Due   Zoster Vaccines- Shingrix (1 of 2) 10/09/1960   Diabetic kidney evaluation - Urine ACR  06/21/2021    Colorectal cancer screening: No longer required.   Mammogram status: Completed 03/17/2023. Repeat every year  Bone Density status: Completed 10/30/2022. Results reflect: Bone density results: NORMAL. Repeat every 5 years.  Lung Cancer Screening: (Low Dose CT Chest recommended if Age 69-80 years, 20 pack-year currently smoking OR have quit w/in 15years.) does not qualify.   Lung Cancer Screening Referral: N/A  Additional Screening:  Hepatitis C Screening: does not qualify;  Vision Screening: Recommended annual ophthalmology exams for early detection of glaucoma and  other disorders of the eye. Is the patient up to date with their annual eye exam?  No  Who is the provider or what is the name of the office in which the patient attends annual eye exams? Dr. Karleen Hampshire If pt is not established with a provider, would they like to be referred to a provider to establish care? No .   Dental Screening: Recommended annual dental exams for proper oral hygiene  Diabetic Foot Exam: Diabetic Foot Exam: Completed 07/16/20241  Community Resource Referral / Chronic Care Management: CRR required this visit?  Yes   CCM required this visit?  No     Plan:     I have personally reviewed and noted the following in the patient's chart:   Medical and social history Use of alcohol, tobacco or illicit drugs  Current medications and supplements including opioid prescriptions. Patient is not currently taking opioid prescriptions. Functional ability and status Nutritional status Physical activity Advanced directives List of other physicians Hospitalizations, surgeries, and ER visits in previous 12 months Vitals Screenings to include cognitive, depression, and falls Referrals and appointments  In addition, I have reviewed and discussed with patient certain preventive protocols, quality metrics, and best practice recommendations. A written personalized care plan for preventive services as well as general preventive health recommendations were provided to patient.     Tamlyn Sides L Nijah Orlich, CMA   11/23/2023   After Visit Summary: (MyChart) Due to this being a telephonic visit, the after visit summary with patients personalized plan was offered to patient via MyChart   Nurse Notes: Patient will be here for a hospital follow up tomorrow with Dr. Alvy Bimler.  She has had several changes in her medications and have some concerns she would like to discuss during her visit tomorrow.  Patient is due for a Flu vaccine and would like to get it during her visit tomorrow.  I have placed a  community resource referral for patient for transportation, food and financial insecurity.  Patient stated that she has been in the hospital for a month.

## 2023-11-23 NOTE — Patient Instructions (Signed)
Visit Information  Thank you for taking time to visit with me today. Please don't hesitate to contact me if I can be of assistance to you before our next scheduled telephone appointment.  Our next appointment is by telephone on Tuesday 12/01/23 at 10:00 am  Please call the care guide team at [phone] if you need to cancel or reschedule your appointment.   Patient Goals/Self-Care Activities: goals per last outreach on 11/05/23: Participate in Transition of Care Program/Attend TOC scheduled calls Take all medications as prescribed Attend all scheduled provider appointments Call provider office for new concerns or questions  Continue pacing activity to avoid episodes of shortness of breath Use assistive devices as needed to prevent falls- your walker and bedside commode Continue using the new non-invasive ventilator that you were given at the hospital before your discharge home Eat a heart healthy and low salt diet If you believe your condition is getting worse- contact your care providers (doctors) promptly- reaching out to your doctor early when you have concerns can prevent you from having to go to the hospital   Following is a copy of your care plan:   Goals Addressed             This Visit's Progress    TOC 30-day Program Care Plan   On track    Current Barriers:  Medication management -- needs full medication review post-recent hospital discharge on 11/04/23 and 11/20/23: husband manages all medications- places in 7-day pill planner box; patient then takes independently; on initial TOC call for both discharges, patient and spouse/ caregiver verbalizes SIGNIFICANT confusion around medications 11/20/23: attempted to complete medication review: patient/ spouse became very frustrated and appear to have significant confusion around medications- post multiple recent hospitalizations; they decline further review today and state they will discuss with PCP during tomorrow's hospital  follow up office visit Made PCP aware of patient's and spouse's confusion around medications Multiple recent hospitalizations: at time of most recent hospital discharge on 11/20/23: hospice recommended- patient and spouse agreed: however, when I contacted [facility]- they report patient and spouse declined all services; husband and patient report today "they do not believe" that patient needs hospice care at this time Home Health services through [facility] for PT/ OT/ RN/ SW [phone]- per outside hospital notes, "Inetta Fermo")- previously ordered at time of hospital discharge on 11/04/23: not ordered post-hospital discharge on 11/20/23- as patient was set up with home hospice services- which she has since declined: made PCP aware of same, in cae he would like to re-order home health services 11/05/23 TOC call:  Equipment/DME confirmed patient obtained newly ordered NIV prior to hospital discharge and is using as instructed  Multiple recent inpatient hospitalizations and SNF rehabilitation visits:  Most recent discharge on 11/04/23-- admitted to hospital from SNF/ rehabilitation facility; 12-day inpatient hospitalization 11/20/23: hospital re-admission within 30 days: 1/03-15, 2025 and 01/22-31, 2025: both for VHF/ chronic respiratory failure  RNCM Clinical Goal(s):  Patient will work with the Care Management team over the next 30 days to address Transition of Care Barriers: Medication Management Support at home Home Health services take all medications exactly as prescribed and will call provider for medication related questions as evidenced by review of medication with TOC 30-day program RN CM  attend all scheduled medical appointments: 11/24/23- PCP: HFU; 12/07/23- [facility] cardiology provider- per patient/ spouse report provider as evidenced by review of same with patient during TOC 30-day RN CM weekly outreaches not experience hospital admission as evidenced by  review of EMR. Hospital  Admissions in last 6 months = 4 inpatient admissions; 1 SNF-rehab admission  through collaboration with RN Care manager, provider, and care team.   Interventions: Evaluation of current treatment plan related to  self management and patient's adherence to plan as established by provider  Transitions of Care:  New goal.  Re-enrolled into [facility] 30-day program on 11/20/23 Durable Medical Equipment (DME) needs assessed with patient/caregiver Community Resource Referral Made to address Food Insecurity Doctor Visits  - discussed the importance of doctor visits Communication with PCP re: barriers, as above re: medication confusion/ home health services in setting of refusal of hospice at home after prior agreement for hospice Confirmed AWE nurse placed referral for [facility] Guide for provision of food resources Confirmed patient is aware of/ has plans to attend as scheduled PCP HFU OV on 11/24/23- husband to transport Provided education around hospice services/ lack of home health services now that she has declined hospice: provided education around process to obtain home health services and sent PCP message to facilitate Attempted full medication reconciliation/ review-- patient and spouse only agreeable to complete partial review: they are significantly confused and then frustrated with medication review post- multiple recent hospital admissions  Patient Goals/Self-Care Activities: goals per last outreach on 11/05/23: Participate in Transition of Care Program/Attend TOC scheduled calls Take all medications as prescribed Attend all scheduled provider appointments Call provider office for new concerns or questions  Continue pacing activity to avoid episodes of shortness of breath Use assistive devices as needed to prevent falls- your walker and bedside commode Continue using the new non-invasive ventilator that you were given at the hospital before your discharge home Eat a heart healthy  and low salt diet If you believe your condition is getting worse- contact your care providers (doctors) promptly- reaching out to your doctor early when you have concerns can prevent you from having to go to the hospital  Follow Up Plan:  Telephone follow up appointment with care management team member scheduled for:  Tuesday 12/01/23 at 10:00 am          The patient verbalized understanding of instructions, educational materials, and care plan provided today and DECLINED offer to receive copy of patient instructions, educational materials, and care plan.   Telephone follow up appointment with care management team member scheduled for: as noted above  if you are experiencing a Mental Health or Behavioral Health Crisis or need someone to talk to, please  call the Suicide and Crisis Lifeline: 988 call the [place] National Suicide Prevention Lifeline: 754-663-3999 or TTY: 971-420-4227 TTY 5818803463) to talk to a trained counselor call 1-800-273-TALK (toll free, 24 hour hotline) go to [facility] [address] [place] [phone]) call the [facility] Crisis Line: 5194148993 call 911   Caryl Pina, RN, BSN, CCRN Alumnus RN Care Manager  Transitions of Care  [facility] Population [facility]  [place] [phone]: direct office

## 2023-11-24 ENCOUNTER — Telehealth: Payer: Self-pay | Admitting: *Deleted

## 2023-11-24 ENCOUNTER — Encounter: Payer: Self-pay | Admitting: Emergency Medicine

## 2023-11-24 ENCOUNTER — Ambulatory Visit (INDEPENDENT_AMBULATORY_CARE_PROVIDER_SITE_OTHER): Payer: Medicare HMO | Admitting: Emergency Medicine

## 2023-11-24 ENCOUNTER — Inpatient Hospital Stay: Payer: Medicare HMO | Admitting: Emergency Medicine

## 2023-11-24 VITALS — BP 118/70 | Temp 98.1°F | Ht 61.0 in | Wt 238.0 lb

## 2023-11-24 DIAGNOSIS — I5042 Chronic combined systolic (congestive) and diastolic (congestive) heart failure: Secondary | ICD-10-CM

## 2023-11-24 DIAGNOSIS — M150 Primary generalized (osteo)arthritis: Secondary | ICD-10-CM | POA: Diagnosis not present

## 2023-11-24 DIAGNOSIS — N1832 Chronic kidney disease, stage 3b: Secondary | ICD-10-CM

## 2023-11-24 DIAGNOSIS — E1165 Type 2 diabetes mellitus with hyperglycemia: Secondary | ICD-10-CM | POA: Diagnosis not present

## 2023-11-24 DIAGNOSIS — E1169 Type 2 diabetes mellitus with other specified complication: Secondary | ICD-10-CM | POA: Diagnosis not present

## 2023-11-24 DIAGNOSIS — I480 Paroxysmal atrial fibrillation: Secondary | ICD-10-CM

## 2023-11-24 DIAGNOSIS — I152 Hypertension secondary to endocrine disorders: Secondary | ICD-10-CM

## 2023-11-24 DIAGNOSIS — I5032 Chronic diastolic (congestive) heart failure: Secondary | ICD-10-CM | POA: Diagnosis not present

## 2023-11-24 DIAGNOSIS — Z09 Encounter for follow-up examination after completed treatment for conditions other than malignant neoplasm: Secondary | ICD-10-CM

## 2023-11-24 DIAGNOSIS — I2510 Atherosclerotic heart disease of native coronary artery without angina pectoris: Secondary | ICD-10-CM

## 2023-11-24 DIAGNOSIS — J984 Other disorders of lung: Secondary | ICD-10-CM | POA: Diagnosis not present

## 2023-11-24 DIAGNOSIS — Z794 Long term (current) use of insulin: Secondary | ICD-10-CM

## 2023-11-24 DIAGNOSIS — I130 Hypertensive heart and chronic kidney disease with heart failure and stage 1 through stage 4 chronic kidney disease, or unspecified chronic kidney disease: Secondary | ICD-10-CM | POA: Diagnosis not present

## 2023-11-24 DIAGNOSIS — I700 Atherosclerosis of aorta: Secondary | ICD-10-CM

## 2023-11-24 DIAGNOSIS — Z87440 Personal history of urinary (tract) infections: Secondary | ICD-10-CM

## 2023-11-24 DIAGNOSIS — E1159 Type 2 diabetes mellitus with other circulatory complications: Secondary | ICD-10-CM | POA: Diagnosis not present

## 2023-11-24 DIAGNOSIS — E1122 Type 2 diabetes mellitus with diabetic chronic kidney disease: Secondary | ICD-10-CM | POA: Diagnosis not present

## 2023-11-24 DIAGNOSIS — E785 Hyperlipidemia, unspecified: Secondary | ICD-10-CM

## 2023-11-24 NOTE — Assessment & Plan Note (Signed)
No signs of acute CHF. No peripheral edema Continues Demadex 20 mg daily Hypertension well-controlled Continues metoprolol succinate 100 mg daily and Cardizem CD1 180 mg daily

## 2023-11-24 NOTE — Assessment & Plan Note (Signed)
Continues to be monitored by nephrology service Advised to stay well-hydrated and avoid NSAIDs

## 2023-11-24 NOTE — Assessment & Plan Note (Signed)
Sinus rhythm with good ventricular response today Continues metoprolol succinate 100 mg daily and Cardizem CD1 180 mg daily No longer on Eliquis Continue amiodarone 400 mg daily

## 2023-11-24 NOTE — Assessment & Plan Note (Signed)
Continues antibiotic treatment with cephalosporin cefadroxil 500 mg twice a day Still has Foley catheter in Anticipating it can come out in the near future

## 2023-11-24 NOTE — Patient Instructions (Signed)
 Health Maintenance After Age 81 After age 27, you are at a higher risk for certain long-term diseases and infections as well as injuries from falls. Falls are a major cause of broken bones and head injuries in people who are older than age 73. Getting regular preventive care can help to keep you healthy and well. Preventive care includes getting regular testing and making lifestyle changes as recommended by your health care provider. Talk with your health care provider about: Which screenings and tests you should have. A screening is a test that checks for a disease when you have no symptoms. A diet and exercise plan that is right for you. What should I know about screenings and tests to prevent falls? Screening and testing are the best ways to find a health problem early. Early diagnosis and treatment give you the best chance of managing medical conditions that are common after age 90. Certain conditions and lifestyle choices may make you more likely to have a fall. Your health care provider may recommend: Regular vision checks. Poor vision and conditions such as cataracts can make you more likely to have a fall. If you wear glasses, make sure to get your prescription updated if your vision changes. Medicine review. Work with your health care provider to regularly review all of the medicines you are taking, including over-the-counter medicines. Ask your health care provider about any side effects that may make you more likely to have a fall. Tell your health care provider if any medicines that you take make you feel dizzy or sleepy. Strength and balance checks. Your health care provider may recommend certain tests to check your strength and balance while standing, walking, or changing positions. Foot health exam. Foot pain and numbness, as well as not wearing proper footwear, can make you more likely to have a fall. Screenings, including: Osteoporosis screening. Osteoporosis is a condition that causes  the bones to get weaker and break more easily. Blood pressure screening. Blood pressure changes and medicines to control blood pressure can make you feel dizzy. Depression screening. You may be more likely to have a fall if you have a fear of falling, feel depressed, or feel unable to do activities that you used to do. Alcohol  use screening. Using too much alcohol  can affect your balance and may make you more likely to have a fall. Follow these instructions at home: Lifestyle Do not drink alcohol  if: Your health care provider tells you not to drink. If you drink alcohol : Limit how much you have to: 0-1 drink a day for women. 0-2 drinks a day for men. Know how much alcohol  is in your drink. In the [place] one drink equals one 12 oz bottle of beer (355 mL), one 5 oz glass of wine (148 mL), or one 1 oz glass of hard liquor (44 mL). Do not use any products that contain nicotine or tobacco. These products include cigarettes, chewing tobacco, and vaping devices, such as e-cigarettes. If you need help quitting, ask your health care provider. Activity  Follow a regular exercise program to stay fit. This will help you maintain your balance. Ask your health care provider what types of exercise are appropriate for you. If you need a cane or walker, use it as recommended by your health care provider. Wear supportive shoes that have nonskid soles. Safety  Remove any tripping hazards, such as rugs, cords, and clutter. Install safety equipment such as grab bars in bathrooms and safety rails on stairs. Keep rooms and walkways  well-lit. General instructions Talk with your health care provider about your risks for falling. Tell your health care provider if: You fall. Be sure to tell your health care provider about all falls, even ones that seem minor. You feel dizzy, tiredness (fatigue), or off-balance. Take over-the-counter and prescription medicines only as told by your health care provider. These include  supplements. Eat a healthy diet and maintain a healthy weight. A healthy diet includes low-fat dairy products, low-fat (lean) meats, and fiber from whole grains, beans, and lots of fruits and vegetables. Stay current with your vaccines. Schedule regular health, dental, and eye exams. Summary Having a healthy lifestyle and getting preventive care can help to protect your health and wellness after age 15. Screening and testing are the best way to find a health problem early and help you avoid having a fall. Early diagnosis and treatment give you the best chance for managing medical conditions that are more common for people who are older than age 42. Falls are a major cause of broken bones and head injuries in people who are older than age 64. Take precautions to prevent a fall at home. Work with your health care provider to learn what changes you can make to improve your health and wellness and to prevent falls. This information is not intended to replace advice given to you by your health care provider. Make sure you discuss any questions you have with your health care provider. Document Revised: 02/25/2021 Document Reviewed: 02/25/2021 Elsevier Patient Education  2024 [employer].

## 2023-11-24 NOTE — Assessment & Plan Note (Signed)
No recent anginal episodes Stable chronic condition

## 2023-11-24 NOTE — Progress Notes (Signed)
 [name] 83 y.o.   Chief Complaint  Patient presents with   Hospitalization Follow-up    Patient wants to know if her cath can come out. Patient wants to go over current med and old meds.    HISTORY OF PRESENT ILLNESS: This is a 83 y.o. female here for follow-up of recent hospital admission when she was admitted with congestive heart failure on atrial fibrillation with fast ventricular response Was also found to have E. coli UTI and bacteremia Feeling better today. Had medication changes diet she wants to go over with me. No other complaints or medical concerns today.  HPI   Prior to Admission medications   Medication Sig Start Date End Date Taking? Authorizing Provider  allopurinol  (ZYLOPRIM ) 300 MG tablet TAKE 1 TABLET BY MOUTH DAILY Patient taking differently: Take 100 mg by mouth daily. TAKE 1/2 tablet daily 11/16/23  Yes Gabrella Stroh Jose, MD  amiodarone  (PACERONE ) 400 MG tablet 400 mg daily. 11/21/23  Yes [provider]  cefadroxil  (DURICEF) 500 MG capsule Take 500 mg by mouth 2 (two) times daily. 11/20/23 11/26/23 Yes [provider]  diltiazem  (CARDIZEM  CD) 180 MG 24 hr capsule Take 180 mg by mouth daily.   Yes [provider]  FEROSUL 325 (65 Fe) MG tablet Take 325 mg by mouth every other day. 10/22/23  Yes [provider]  levothyroxine  (SYNTHROID ) 112 MCG tablet Take 1 tablet (112 mcg total) by mouth daily. 11/16/23  Yes Marc Sivertsen Jose, MD  metoprolol  succinate (TOPROL -XL) 100 MG 24 hr tablet Take 100 mg by mouth daily. 11/02/23 12/02/23 Yes [provider]  rosuvastatin  (CRESTOR ) 10 MG tablet TAKE 1 TABLET BY MOUTH IN THE  EVENING 02/11/23  Yes Akshaj Besancon, Emil Schanz, MD  sertraline  (ZOLOFT ) 25 MG tablet Take 25 mg by mouth daily. 10/31/23  Yes [provider]  torsemide  (DEMADEX ) 20 MG tablet Take 1 tablet (20 mg total) by mouth daily. Please keep scheduled appointment for future refills. Thank you. 08/07/23  Yes  Fernande Elspeth BROCKS, MD  Accu-Chek Softclix Lancets lancets 1 each by Other route 3 (three) times daily. as directed 03/30/20   [name], NP  BD PEN NEEDLE NANO 2ND GEN 32G X 4 MM MISC USE TWICE DAILY AS NEEDED 08/02/22   Purcell Emil Schanz, MD  blood glucose meter kit and supplies Dispense based on patient and insurance preference. Use up to four times daily as directed. (FOR ICD-10 E10.9, E11.9). 08/29/21   Purcell Emil Schanz, MD  Continuous Blood Gluc Receiver (FREESTYLE LIBRE 2 READER) DEVI Use to check blood glucose. Use as directed 01/08/23   Purcell Emil Schanz, MD  Continuous Glucose Sensor (FREESTYLE LIBRE 2 SENSOR) MISC USE AS DIRECTED TO CHECK BLOOD  SUGAR DAILY 08/05/23   [name], [name], MD  Glucagon  (GVOKE HYPOPEN  2-PACK) 0.5 MG/0.1ML SOAJ Inject 0.5 mg into the skin daily as needed. For Hypoglycemic events 05/30/22   Purcell Emil Schanz, MD  Insulin  Lispro Prot & Lispro (HUMALOG  MIX 75/25 KWIKPEN) (75-25) 100 UNIT/ML Kwikpen Inject 20 Units into the skin 2 (two) times daily before a meal. 07/07/23   [name], Donell Cardinal, MD  potassium chloride  (KLOR-CON ) 10 MEQ tablet Take 4 tablets (40 mEq total) by mouth daily. 12/10/21 03/06/22  [name], PA-C    Allergies  Allergen Reactions   Doxycycline      Made patient feel very ill    Metformin  Diarrhea    Patient Active Problem List   Diagnosis Date Noted  Right foot pain 07/28/2023   Hyperkalemia, diminished renal excretion 05/27/2023   Allergic rhinitis 01/15/2023   Stage 3b chronic kidney disease (HCC) 01/07/2023   Primary hyperaldosteronism (HCC) 03/06/2022   Coronary artery disease involving native coronary artery of native heart without angina pectoris 02/12/2022   Adrenal adenoma, right 02/07/2022   Skin lesion of right arm 02/02/2022   Asthma 11/01/2021   Normocytic anemia 10/11/2021   Nodule of right lung 09/30/2021   Dysthymia 03/12/2021   Primary osteoarthritis involving  multiple joints 03/01/2021   Recurrent falls 01/10/2021   Sensorineural hearing loss (SNHL), 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   Persistent atrial fibrillation (HCC) 08/12/2020   Type 2 diabetes mellitus with hyperglycemia, with long-term current use of insulin  (HCC) 06/21/2020   Dyslipidemia associated with type 2 diabetes mellitus (HCC) 06/21/2020   Mixed hyperlipidemia 06/21/2020   Uncontrolled hypertension 06/11/2020   Aortic atherosclerosis (HCC) 03/28/2020   Diabetic peripheral neuropathy (HCC) 12/26/2019   Gastroesophageal reflux disease without esophagitis 03/17/2019   Varicose veins of both legs with edema 02/16/2019   Heart murmur 09/05/2018   Restrictive lung disease 02/22/2018   Dysgeusia 06/11/2017   Osteoarthritis of left knee 06/03/2017   NAFLD (nonalcoholic fatty liver disease) 89/80/7982   Osteoporosis 12/31/2015   Obesity 12/19/2015   Acquired hypothyroidism 10/18/2015   Prolonged QT interval 10/03/2013   Community acquired pneumonia 04/21/2013   CHF (congestive heart failure) (HCC) 04/21/2013   Long term (current) use of anticoagulants 06/09/2012   BENIGN NEOPLASM OF ADRENAL GLAND 11/25/2010   Chronic diastolic heart failure (HCC) 02/20/2010   Diabetes (HCC) 05/16/2009   GOUT 05/16/2009   OBESITY, MORBID 05/16/2009   Hypertension associated with diabetes (HCC) 05/16/2009   Paroxysmal atrial fibrillation (HCC) 05/16/2009   HYPERLIPIDEMIA 11/30/2008   Obstructive sleep apnea on CPAP 11/30/2008    Past Medical History:  Diagnosis Date   Arthritis    Back pain    BCC (basal cell carcinoma) 04/01/2023   right lower chin, Mohs completed on 04/21/23   Chronic anticoagulation    due to aflutter   Chronic kidney disease    Diabetes mellitus    Diastolic CHF, chronic (HCC)    a.  echo 2006 - ef 55-65%; mild diast dysfxn;    b. Echo 08/2011: Mild LVH, EF 60%;  c. 04/2013 Echo: EF 65-69%, mild conc LVH;  08/2014 Echo: EF 60-65%, mild-mod MR.   Gout    Hyperlipidemia     Hypertension    a.  Renal arterial Dopplers 12/2011: 1-59% right renal artery stenosis   Morbid obesity (HCC)    Obstructive sleep apnea on CPAP    Paroxysmal Afib/Flutter    a. dccv: 08/2011 - on amiodarone /coumadin     Past Surgical History:  Procedure Laterality Date   APPENDECTOMY     ATRIAL FLUTTER ABLATION N/A 09/24/2011   Procedure: ATRIAL FLUTTER ABLATION;  Surgeon: Danelle LELON Birmingham, MD;  Location: [facility] CATH LAB;  Service: Cardiovascular;  Laterality: N/A;   CARDIOVERSION  10/22/2011   Procedure: CARDIOVERSION;  Surgeon: Elspeth JAYSON Sage, MD;  Location: [facility] OR;  Service: Cardiovascular;  Laterality: N/A;   CARDIOVERSION N/A 09/10/2011   Procedure: CARDIOVERSION;  Surgeon: Elspeth JAYSON Sage, MD;  Location: [facility] CATH LAB;  Service: Cardiovascular;  Laterality: N/A;   CHOLECYSTECTOMY     COLONOSCOPY WITH PROPOFOL  N/A 06/13/2021   Procedure: COLONOSCOPY WITH PROPOFOL ;  Surgeon: Teressa Toribio SQUIBB, MD;  Location: [facility] ENDOSCOPY;  Service: Endoscopy;  Laterality: N/A;   POLYPECTOMY  06/13/2021   Procedure: POLYPECTOMY;  Surgeon: Teressa Toribio SQUIBB, MD;  Location: [facility];  Service: Endoscopy;;   TONSILLECTOMY  1982   TOTAL ABDOMINAL HYSTERECTOMY      Social History   Socioeconomic History   Marital status: Married    Spouse name: Not on file   Number of children: 3   Years of education: Not on file   Highest education level: Not on file  Occupational History   Occupation: DISABILITY/housewife    Employer: RETIRED  Tobacco Use   Smoking status: Never   Smokeless tobacco: Never  Vaping Use   Vaping status: Never Used  Substance and Sexual Activity   Alcohol use: No   Drug use: No   Sexual activity: Yes  Other Topics Concern   Not on file  Social History Narrative   Lives with husband.   Social Drivers of Health   Financial Resource Strain: High Risk (11/23/2023)   Overall Financial Resource Strain (CARDIA)    Difficulty of Paying Living Expenses: Very hard  Food Insecurity: Patient  Declined (11/23/2023)   Hunger Vital Sign    Worried About Running Out of Food in the Last Year: Patient declined    Ran Out of Food in the Last Year: Patient declined  Recent Concern: Food Insecurity - Food Insecurity Present (11/23/2023)   Hunger Vital Sign    Worried About Running Out of Food in the Last Year: Often true    Ran Out of Food in the Last Year: Often true  Transportation Needs: No Transportation Needs (11/23/2023)   PRAPARE - Administrator, Civil Service (Medical): No    Lack of Transportation (Non-Medical): No  Recent Concern: Transportation Needs - Unmet Transportation Needs (11/23/2023)   PRAPARE - Transportation    Lack of Transportation (Medical): Yes    Lack of Transportation (Non-Medical): Yes  Physical Activity: Inactive (11/23/2023)   Exercise Vital Sign    Days of Exercise per Week: 0 days    Minutes of Exercise per Session: 0 min  Stress: No Stress Concern Present (11/23/2023)   [employer] of Occupational Health - Occupational Stress Questionnaire    Feeling of Stress : Not at all  Social Connections: Socially Integrated (11/23/2023)   Social Connection and Isolation Panel [NHANES]    Frequency of Communication with Friends and Family: More than three times a week    Frequency of Social Gatherings with Friends and Family: More than three times a week    Attends Religious Services: More than 4 times per year    Active Member of [employer] or Organizations: Yes    Attends Banker Meetings: Never    Marital Status: Married  Catering Manager Violence: Not At Risk (11/23/2023)   Humiliation, Afraid, Rape, and Kick questionnaire    Fear of Current or Ex-Partner: No    Emotionally Abused: No    Physically Abused: No    Sexually Abused: No    Family History  Problem Relation Age of Onset   Heart disease Father    Hypertension Father    Breast cancer Sister    Cancer Sister        breast   Colon cancer Neg Hx    Esophageal cancer Neg Hx     Pancreatic cancer Neg Hx    Liver disease Neg Hx      Review of Systems  Constitutional: Negative.  Negative for chills and fever.  HENT: Negative.  Negative for congestion and sore throat.   Respiratory: Negative.  Negative for  cough and shortness of breath.   Cardiovascular: Negative.  Negative for chest pain and palpitations.  Gastrointestinal:  Negative for abdominal pain, diarrhea, nausea and vomiting.  Genitourinary: Negative.  Negative for dysuria and hematuria.  Skin: Negative.  Negative for rash.  Neurological: Negative.  Negative for dizziness and headaches.  All other systems reviewed and are negative.   Vitals:   11/24/23 1412  BP: 118/70  Temp: 98.1 F (36.7 C)   Wt Readings from Last 3 Encounters:  11/24/23 238 lb (108 kg)  11/23/23 260 lb (117.9 kg)  09/22/23 260 lb (117.9 kg)     Physical Exam Vitals reviewed.  Constitutional:      Appearance: Normal appearance.  HENT:     Head: Normocephalic.     Mouth/Throat:     Mouth: Mucous membranes are moist.     Pharynx: Oropharynx is clear.  Eyes:     Extraocular Movements: Extraocular movements intact.     Pupils: Pupils are equal, round, and reactive to light.  Cardiovascular:     Rate and Rhythm: Normal rate and regular rhythm.     Pulses: Normal pulses.     Heart sounds: Normal heart sounds.  Pulmonary:     Effort: Pulmonary effort is normal.     Breath sounds: Normal breath sounds.  Musculoskeletal:     Cervical back: No tenderness.     Right lower leg: No edema.     Left lower leg: No edema.  Lymphadenopathy:     Cervical: No cervical adenopathy.  Skin:    General: Skin is warm and dry.     Capillary Refill: Capillary refill takes less than 2 seconds.  Neurological:     Mental Status: She is alert and oriented to person, place, and time.  Psychiatric:        Mood and Affect: Mood normal.        Behavior: Behavior normal.      ASSESSMENT & PLAN: A total of 47 minutes was spent with the  patient and counseling/coordination of care regarding preparing for this visit, review of most recent office visit notes, review of recent hospital discharge summary, review of multiple chronic medical conditions and their management, review of all medications, review of most recent bloodwork results, review of health maintenance items, education on nutrition, prognosis, documentation, and need for follow up.  Problem List Items Addressed This Visit       Cardiovascular and Mediastinum   Hypertension associated with diabetes (HCC)   Well-controlled hypertension Continues metoprolol  succinate 100 mg and Cardizem  CD 180 mg daily Taking Humalog  75/25 20 units twice a day Good glucose readings at home Sees endocrinologist on a regular basis       Paroxysmal atrial fibrillation (HCC) - Primary   Sinus rhythm with good ventricular response today Continues metoprolol  succinate 100 mg daily and Cardizem  CD1 180 mg daily No longer on Eliquis  Continue amiodarone  400 mg daily      CHF (congestive heart failure) (HCC)   No signs of acute CHF. No peripheral edema Continues Demadex  20 mg daily Hypertension well-controlled Continues metoprolol  succinate 100 mg daily and Cardizem  CD1 180 mg daily      Aortic atherosclerosis (HCC)   Chronic stable condition Diet and nutrition discussed Continue rosuvastatin  10 mg daily      Coronary artery disease involving native coronary artery of native heart without angina pectoris   No recent anginal episodes Stable chronic condition        Endocrine  Dyslipidemia associated with type 2 diabetes mellitus (HCC)   Continue rosuvastatin  10 mg daily Insulin  20 units twice a day        Genitourinary   Stage 3b chronic kidney disease (HCC)   Continues to be monitored by nephrology service Advised to stay well-hydrated and avoid NSAIDs        Other   Recent urinary tract infection   Continues antibiotic treatment with cephalosporin cefadroxil   500 mg twice a day Still has Foley catheter in Anticipating it can come out in the near future      Other Visit Diagnoses       Hospital discharge follow-up          Patient Instructions  Health Maintenance After Age 21 After age 3, you are at a higher risk for certain long-term diseases and infections as well as injuries from falls. Falls are a major cause of broken bones and head injuries in people who are older than age 82. Getting regular preventive care can help to keep you healthy and well. Preventive care includes getting regular testing and making lifestyle changes as recommended by your health care provider. Talk with your health care provider about: Which screenings and tests you should have. A screening is a test that checks for a disease when you have no symptoms. A diet and exercise plan that is right for you. What should I know about screenings and tests to prevent falls? Screening and testing are the best ways to find a health problem early. Early diagnosis and treatment give you the best chance of managing medical conditions that are common after age 51. Certain conditions and lifestyle choices may make you more likely to have a fall. Your health care provider may recommend: Regular vision checks. Poor vision and conditions such as cataracts can make you more likely to have a fall. If you wear glasses, make sure to get your prescription updated if your vision changes. Medicine review. Work with your health care provider to regularly review all of the medicines you are taking, including over-the-counter medicines. Ask your health care provider about any side effects that may make you more likely to have a fall. Tell your health care provider if any medicines that you take make you feel dizzy or sleepy. Strength and balance checks. Your health care provider may recommend certain tests to check your strength and balance while standing, walking, or changing positions. Foot health  exam. Foot pain and numbness, as well as not wearing proper footwear, can make you more likely to have a fall. Screenings, including: Osteoporosis screening. Osteoporosis is a condition that causes the bones to get weaker and break more easily. Blood pressure screening. Blood pressure changes and medicines to control blood pressure can make you feel dizzy. Depression screening. You may be more likely to have a fall if you have a fear of falling, feel depressed, or feel unable to do activities that you used to do. Alcohol use screening. Using too much alcohol can affect your balance and may make you more likely to have a fall. Follow these instructions at home: Lifestyle Do not drink alcohol if: Your health care provider tells you not to drink. If you drink alcohol: Limit how much you have to: 0-1 drink a day for women. 0-2 drinks a day for men. Know how much alcohol is in your drink. In the [place] one drink equals one 12 oz bottle of beer (355 mL), one 5 oz glass of wine (148 mL), or  one 1 oz glass of hard liquor (44 mL). Do not use any products that contain nicotine or tobacco. These products include cigarettes, chewing tobacco, and vaping devices, such as e-cigarettes. If you need help quitting, ask your health care provider. Activity  Follow a regular exercise program to stay fit. This will help you maintain your balance. Ask your health care provider what types of exercise are appropriate for you. If you need a cane or walker, use it as recommended by your health care provider. Wear supportive shoes that have nonskid soles. Safety  Remove any tripping hazards, such as rugs, cords, and clutter. Install safety equipment such as grab bars in bathrooms and safety rails on stairs. Keep rooms and walkways well-lit. General instructions Talk with your health care provider about your risks for falling. Tell your health care provider if: You fall. Be sure to tell your health care provider  about all falls, even ones that seem minor. You feel dizzy, tiredness (fatigue), or off-balance. Take over-the-counter and prescription medicines only as told by your health care provider. These include supplements. Eat a healthy diet and maintain a healthy weight. A healthy diet includes low-fat dairy products, low-fat (lean) meats, and fiber from whole grains, beans, and lots of fruits and vegetables. Stay current with your vaccines. Schedule regular health, dental, and eye exams. Summary Having a healthy lifestyle and getting preventive care can help to protect your health and wellness after age 66. Screening and testing are the best way to find a health problem early and help you avoid having a fall. Early diagnosis and treatment give you the best chance for managing medical conditions that are more common for people who are older than age 65. Falls are a major cause of broken bones and head injuries in people who are older than age 19. Take precautions to prevent a fall at home. Work with your health care provider to learn what changes you can make to improve your health and wellness and to prevent falls. This information is not intended to replace advice given to you by your health care provider. Make sure you discuss any questions you have with your health care provider. Document Revised: 02/25/2021 Document Reviewed: 02/25/2021 Elsevier Patient Education  2024 Elsevier Inc.    Emil Schaumann, MD [facility]

## 2023-11-24 NOTE — Assessment & Plan Note (Signed)
Well-controlled hypertension Continues metoprolol succinate 100 mg and Cardizem CD 180 mg daily Taking Humalog 75/25 20 units twice a day Good glucose readings at home Sees endocrinologist on a regular basis

## 2023-11-24 NOTE — Assessment & Plan Note (Signed)
Continue rosuvastatin 10 mg daily Insulin 20 units twice a day

## 2023-11-24 NOTE — Progress Notes (Signed)
 Complex Care Management Note Care Guide Note  11/24/2023 Name: [name] MRN: [id number] DOB: 09-19-1941   Complex Care Management Outreach Attempts: An unsuccessful telephone outreach was attempted today to offer the patient information about available complex care management services.  Follow Up Plan:  Additional outreach attempts will be made to offer the patient complex care management information and services.   Encounter Outcome:  No Answer  Thedford Franks, CMA, Care Guide [facility]  [facility], [facility] Guide Direct Dial: [phone]  Fax: [phone] Website: [email or web address]

## 2023-11-24 NOTE — Assessment & Plan Note (Signed)
Chronic stable condition Diet and nutrition discussed Continue rosuvastatin 10 mg daily

## 2023-11-25 ENCOUNTER — Telehealth: Payer: Self-pay | Admitting: Emergency Medicine

## 2023-11-25 ENCOUNTER — Other Ambulatory Visit: Payer: Self-pay | Admitting: Radiology

## 2023-11-25 ENCOUNTER — Telehealth: Payer: Self-pay | Admitting: *Deleted

## 2023-11-25 DIAGNOSIS — I5042 Chronic combined systolic (congestive) and diastolic (congestive) heart failure: Secondary | ICD-10-CM

## 2023-11-25 DIAGNOSIS — N1832 Chronic kidney disease, stage 3b: Secondary | ICD-10-CM

## 2023-11-25 DIAGNOSIS — I480 Paroxysmal atrial fibrillation: Secondary | ICD-10-CM

## 2023-11-25 DIAGNOSIS — E785 Hyperlipidemia, unspecified: Secondary | ICD-10-CM

## 2023-11-25 DIAGNOSIS — E1159 Type 2 diabetes mellitus with other circulatory complications: Secondary | ICD-10-CM

## 2023-11-25 NOTE — Progress Notes (Signed)
 Opened in error

## 2023-11-25 NOTE — Telephone Encounter (Signed)
Spoke with pt and made her aware that Next nurse visit at her house the RN should take it out for her. She understood and will call if she had anymore questions

## 2023-11-25 NOTE — Telephone Encounter (Signed)
Copied from CRM [id number]. Topic: Clinical - Medical Advice >> Nov 25, 2023  8:30 AM Fredrich Romans wrote: Reason for CRM: patient called In wanting to know does Dr Alvy Bimler know when she is suppose to have her catheter taken out?

## 2023-11-25 NOTE — Progress Notes (Signed)
 Complex Care Management Note  Care Guide Note 11/25/2023 Name: [name] MRN: [id number] DOB: 01-25-1941  [name] is a 83 y.o. year old female who sees Sagardia, Emil Schanz, MD for primary care. I reached out to Lavanda Rod by phone today to offer complex care management services.  Ms. Bussa was given information about Complex Care Management services today including:   The Complex Care Management services include support from the care team which includes your Nurse Care Manager, Clinical Social Worker, or Pharmacist.  The Complex Care Management team is here to help remove barriers to the health concerns and goals most important to you. Complex Care Management services are voluntary, and the patient may decline or stop services at any time by request to their care team member.   Complex Care Management Consent Status: Patient agreed to services and verbal consent obtained.   Follow up plan:  Telephone appointment with complex care management team member scheduled for:  12/09/2023  Encounter Outcome:  Patient Scheduled  Thedford Franks, CMA, Care Guide [facility]  [facility], [facility] Guide Direct Dial: [phone]  Fax: [phone] Website: [email or web address]

## 2023-11-25 NOTE — Telephone Encounter (Signed)
 Catheter should come out during the next nurses visit to her place.  It is not supposed to stay in for a long time or forever.

## 2023-11-25 NOTE — Telephone Encounter (Signed)
 Okay to place consult for palliative care services

## 2023-11-25 NOTE — Telephone Encounter (Signed)
 Copied from CRM [id number]. Topic: Clinical - Home Health Verbal Orders >> Nov 25, 2023 11:56 AM Luevenia GAILS wrote: Caller/Agency: Sherrell with [facility] Callback Number: [phone] Service Requested: Palliative Care Frequency: initial order Any new concerns about the patient? No

## 2023-11-25 NOTE — Telephone Encounter (Signed)
 Palliative orders placed

## 2023-11-26 DIAGNOSIS — I5032 Chronic diastolic (congestive) heart failure: Secondary | ICD-10-CM | POA: Diagnosis not present

## 2023-11-26 DIAGNOSIS — Z794 Long term (current) use of insulin: Secondary | ICD-10-CM | POA: Diagnosis not present

## 2023-11-26 DIAGNOSIS — I130 Hypertensive heart and chronic kidney disease with heart failure and stage 1 through stage 4 chronic kidney disease, or unspecified chronic kidney disease: Secondary | ICD-10-CM | POA: Diagnosis not present

## 2023-11-26 DIAGNOSIS — E1122 Type 2 diabetes mellitus with diabetic chronic kidney disease: Secondary | ICD-10-CM | POA: Diagnosis not present

## 2023-11-26 DIAGNOSIS — N1832 Chronic kidney disease, stage 3b: Secondary | ICD-10-CM | POA: Diagnosis not present

## 2023-11-26 DIAGNOSIS — J984 Other disorders of lung: Secondary | ICD-10-CM | POA: Diagnosis not present

## 2023-11-26 DIAGNOSIS — M150 Primary generalized (osteo)arthritis: Secondary | ICD-10-CM | POA: Diagnosis not present

## 2023-11-26 DIAGNOSIS — I480 Paroxysmal atrial fibrillation: Secondary | ICD-10-CM | POA: Diagnosis not present

## 2023-11-26 DIAGNOSIS — E1165 Type 2 diabetes mellitus with hyperglycemia: Secondary | ICD-10-CM | POA: Diagnosis not present

## 2023-11-26 NOTE — Telephone Encounter (Signed)
 Copied from CRM [id number]. Topic: General - Other >> Nov 26, 2023  3:09 PM Trula Gable C wrote: Reason for CRM: [facility] called in stating they did received referral for palliative care services , and will be moving forward with the patient

## 2023-11-27 ENCOUNTER — Other Ambulatory Visit: Payer: Self-pay

## 2023-11-27 ENCOUNTER — Telehealth: Payer: Self-pay | Admitting: Emergency Medicine

## 2023-11-27 ENCOUNTER — Encounter (HOSPITAL_BASED_OUTPATIENT_CLINIC_OR_DEPARTMENT_OTHER): Payer: Self-pay

## 2023-11-27 ENCOUNTER — Emergency Department (HOSPITAL_BASED_OUTPATIENT_CLINIC_OR_DEPARTMENT_OTHER)
Admission: EM | Admit: 2023-11-27 | Discharge: 2023-11-27 | Disposition: A | Discharge status: Home / Self Care | Payer: Medicare HMO | Attending: Emergency Medicine | Admitting: Emergency Medicine

## 2023-11-27 ENCOUNTER — Ambulatory Visit: Payer: Self-pay | Admitting: Emergency Medicine

## 2023-11-27 DIAGNOSIS — T83091A Other mechanical complication of indwelling urethral catheter, initial encounter: Secondary | ICD-10-CM | POA: Diagnosis not present

## 2023-11-27 DIAGNOSIS — J984 Other disorders of lung: Secondary | ICD-10-CM | POA: Diagnosis not present

## 2023-11-27 DIAGNOSIS — E039 Hypothyroidism, unspecified: Secondary | ICD-10-CM | POA: Diagnosis not present

## 2023-11-27 DIAGNOSIS — N183 Chronic kidney disease, stage 3 unspecified: Secondary | ICD-10-CM | POA: Diagnosis not present

## 2023-11-27 DIAGNOSIS — T839XXA Unspecified complication of genitourinary prosthetic device, implant and graft, initial encounter: Secondary | ICD-10-CM | POA: Diagnosis not present

## 2023-11-27 DIAGNOSIS — I129 Hypertensive chronic kidney disease with stage 1 through stage 4 chronic kidney disease, or unspecified chronic kidney disease: Secondary | ICD-10-CM | POA: Diagnosis not present

## 2023-11-27 DIAGNOSIS — I5032 Chronic diastolic (congestive) heart failure: Secondary | ICD-10-CM | POA: Diagnosis not present

## 2023-11-27 DIAGNOSIS — M150 Primary generalized (osteo)arthritis: Secondary | ICD-10-CM | POA: Diagnosis not present

## 2023-11-27 DIAGNOSIS — Z794 Long term (current) use of insulin: Secondary | ICD-10-CM | POA: Insufficient documentation

## 2023-11-27 DIAGNOSIS — Z79899 Other long term (current) drug therapy: Secondary | ICD-10-CM | POA: Diagnosis not present

## 2023-11-27 DIAGNOSIS — N1832 Chronic kidney disease, stage 3b: Secondary | ICD-10-CM | POA: Diagnosis not present

## 2023-11-27 DIAGNOSIS — I130 Hypertensive heart and chronic kidney disease with heart failure and stage 1 through stage 4 chronic kidney disease, or unspecified chronic kidney disease: Secondary | ICD-10-CM | POA: Diagnosis not present

## 2023-11-27 DIAGNOSIS — I480 Paroxysmal atrial fibrillation: Secondary | ICD-10-CM | POA: Diagnosis not present

## 2023-11-27 DIAGNOSIS — E1122 Type 2 diabetes mellitus with diabetic chronic kidney disease: Secondary | ICD-10-CM | POA: Diagnosis not present

## 2023-11-27 DIAGNOSIS — E1165 Type 2 diabetes mellitus with hyperglycemia: Secondary | ICD-10-CM | POA: Diagnosis not present

## 2023-11-27 LAB — URINALYSIS, ROUTINE W REFLEX MICROSCOPIC
Bilirubin Urine: NEGATIVE
Glucose, UA: NEGATIVE mg/dL
Ketones, ur: NEGATIVE mg/dL
Nitrite: NEGATIVE
Protein, ur: NEGATIVE mg/dL
Specific Gravity, Urine: 1.015 (ref 1.005–1.030)
pH: 5.5 (ref 5.0–8.0)

## 2023-11-27 LAB — URINALYSIS, MICROSCOPIC (REFLEX): WBC, UA: >50 WBC/hpf (ref 0–5)

## 2023-11-27 NOTE — Telephone Encounter (Signed)
 Copied from CRM [id number]. Topic: Clinical - Home Health Verbal Orders >> Nov 27, 2023 10:24 AM China J wrote: Caller/Agency: [place] / [facility]  Callback Number: [phone] Service Requested: Skilled Nursing Frequency:  Any new concerns about the patient? Yes

## 2023-11-27 NOTE — ED Triage Notes (Signed)
 Pt reports that she can to ED last Friday and foley cath was placed and she was D/C. Pt states that she is now having discomfort and states that it has been leaking and wants it checked.

## 2023-11-27 NOTE — Discharge Instructions (Signed)
 Urine culture sent.  He will be notified if it grows a significant amount of bacteria.  Follow-up with home nursing as well as urology and cardiology as recommended from the last discharge.  Return for development of fever or any new or worse symptoms.

## 2023-11-27 NOTE — Telephone Encounter (Signed)
 Chief Complaint: Catheter leak  Symptoms: burning with urination, urine leaking, severe pain  Frequency: Today  Pertinent Negatives: Patient denies fever, nausea, vomiting, bloating Disposition: [x] ED /[] Urgent Care (no appt availability in office) / [] Appointment(In office/virtual)/ []  [place] [facility]/ [] Home Care/ [] Refused Recommended Disposition /[] [place] [facility]/ []  Follow-up with PCP Additional Notes: patient son stated the home health nurse advised him to take the patient to urgent care due to severe pain and leaking catheter. When they arrive at urgent care they were told by staff that they do not do catheter care or replacements. Patient still in pain with burning during urination. Patient has not been seen by urology yet. Care advice was given and ED recommended for catheter assessment. Patient recently had surgery.    Copied from CRM [id number]. Topic: Clinical - Red Word Triage >> Nov 27, 2023  4:16 PM China J wrote: [facility] that prompted transfer to Nurse Triage: Catheter leak, severe pain, stinging/burning. Reason for Disposition  [1] Catheter is broken AND [2] is not working (does not function normally)  Answer Assessment - Initial Assessment Questions 1. SYMPTOMS: What symptoms are you concerned about? Pain  2. ONSET:  When did the symptoms start?     Today  3. FEVER: Is there a fever? If Yes, ask: What is the temperature, how was it measured, and when did it start?     No  4. ABDOMEN PAIN: Is there any abdomen pain? (e.g., Scale 1-10; or mild, moderate, severe)     Mild  5. URINE COLOR: What color is the urine?  Is there blood present in the urine? (e.g., clear, yellow, cloudy, tea-colored, blood streaks, bright red)     yellow 6. URINE AMOUNT: When did you last empty the urine from the collection bag? How much urine was in the bag at that time? How much urine is in the collection bag now?     of urine, last emptied this morning  7.  INSERTION: How long have you (they) had the catheter?     Last Friday  8. OTHER SYMPTOMS: Are there any other symptoms? (e.g., abdomen swelling, back pain, bladder spasms, constipation, foul smelling urine, leaking of urine)      Leaking urine burning pain  9. MEDICINES: Are you taking any medicines to treat urinary problems? (e.g., antibiotics for a urinary tract infection, medicines to treat bladder spasms)      Antibiotics  Protocols used: Urinary Catheter (e.g., Foley) Symptoms and Questions-A-AH

## 2023-11-27 NOTE — Telephone Encounter (Signed)
 Chief Complaint: Burning with urination/ catheter leaking Symptoms: Pain  Frequency: Progressively worse Pertinent Negatives: Patient denies blood in urine, flank pain, vaginal discharge Disposition: [] ED /[x] Urgent Care (no appt availability in office) / [] Appointment(In office/virtual)/ []  [place] [facility]/ [] Home Care/ [] Refused Recommended Disposition /[] [place] [facility]/ []  Follow-up with PCP Additional Notes: Pt states she got a catheter last Fri. Pt on abx for infection. Doctor advised to keep catheter in until they can do urinalysis after finishing abx. Pt concerned about whether the catheter is in correctly and wants a nurse to look at it. Appt scheduled for UC (no in office appts available). This RN educated pt on home care, new-worsening symptoms, when to call back/seek emergent care. Pt verbalized understanding and agrees to plan.   Copied from CRM [id number]. Topic: Clinical - Red Word Triage >> Nov 27, 2023 11:57 AM Carmell SAUNDERS wrote: Red Word that prompted transfer to Nurse Triage: Burning is unbearable when urinating. She has a catheter and its leaking and a nurse has not yet checked it. Was discharged last Friday. Reason for Disposition  [1] SEVERE pain (e.g., excruciating) AND [2] no improvement 2 hours after pain medications  Answer Assessment - Initial Assessment Questions 1. MAIN SYMPTOM: What is the main symptom you are concerned about? (e.g., painful urination, urine frequency)     Burning wit urination 2. BETTER-SAME-WORSE: Are you getting better, staying the same, or getting worse compared to how you felt at your last visit to the doctor (most recent medical visit)?     Not getting worse 3. PAIN: How bad is the pain?  (e.g., Scale 1-10; mild, moderate, or severe)   - MILD (1-3): complains slightly about urination hurting   - MODERATE (4-7): interferes with normal activities     - SEVERE (8-10): excruciating, unwilling or unable to urinate because of the  pain      6 4. FEVER: Do you have a fever? If Yes, ask: What is it, how was it measured, and when did it start?     No 5. OTHER SYMPTOMS: Do you have any other symptoms? (e.g., blood in the urine, flank pain, vaginal discharge)     Denies 6. DIAGNOSIS: When was the UTI diagnosed? By whom? Was it a kidney infection, bladder infection or both?     While in hospital 7. ANTIBIOTIC: What antibiotic(s) are you taking? How many times per day?     Can't find it  Protocols used: Urinary Tract Infection on Antibiotic Follow-up Call - [facility]

## 2023-11-27 NOTE — ED Provider Notes (Signed)
 [place] EMERGENCY DEPARTMENT AT [facility] [place] Provider Note   CSN: [id number] Arrival date & time: 11/27/23  1729     History  No chief complaint on file.   [name] is a 83 y.o. female.  Patient here with concerns for malfunctioning Foley catheter and/or infection.  Patient has been admitted frequently to the [facility].  Recently admitted January 22 through January 31.  Was in the ICU.  Foley catheter was placed for difficulty voiding and send like there may have been urinary tract infection as well as congestive heart failure.  Patient currently has home nurse coming out as well as physical therapy.  He was to follow-up with cardiology as well as urology to determine when the catheter will be removed.  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  Today there was some leakage around the Foley catheter but later this evening it seemed to be working properly.  Patient was complaining of a little bit of burning.  So they were worried about it not functioning properly or infection.  Patient without any fevers no abdominal discomfort no nausea or vomiting.  No change in mental status.  In addition patient just finished up antibiotic today which would have been cefadroxil .  That would have ended today or yesterday.  Appears that during the hospitalization he had E. coli he bacteremia with left-sided pyelonephritis recurrent UTI leukocytosis.  CT scan of the chest abdomen pelvis showed inflammatory changes involving the left kidney with a mild left hydronephrosis.  That may related to polynephritis ascending urinary tract infection or her recent urine culture on January 5 grew E. coli and Klebsiella has had Enterococcus in the past blood cultures were positive for E. coli with amp and fluoroquinolone  resistance.  They continued Rocephin  while in the hospital and then she was transition to the cefadroxil  for discharge.       Home Medications Prior to Admission medications   Medication Sig Start Date End Date Taking? Authorizing Provider  Accu-Chek Softclix Lancets lancets 1 each by Other route 3 (three) times daily. as directed 03/30/20   [name], NP  allopurinol  (ZYLOPRIM ) 300 MG tablet TAKE 1 TABLET BY MOUTH DAILY Patient taking differently: Take 100 mg by mouth daily. TAKE 1/2 tablet daily 11/16/23   Sagardia, Miguel Jose, MD  amiodarone  (PACERONE ) 400 MG tablet 400 mg daily. 11/21/23   [provider]  BD PEN NEEDLE NANO 2ND GEN 32G X 4 MM MISC USE TWICE DAILY AS NEEDED 08/02/22   Purcell Emil Schanz, MD  blood glucose meter kit and supplies Dispense based on patient and insurance preference. Use up to four times daily as directed. (FOR ICD-10 E10.9, E11.9). 08/29/21   Purcell Emil Schanz, MD  Continuous Blood Gluc Receiver (FREESTYLE LIBRE 2 READER) DEVI Use to check blood glucose. Use as directed 01/08/23   Purcell Emil Schanz, MD  Continuous Glucose Sensor (FREESTYLE LIBRE 2 SENSOR) MISC USE AS DIRECTED TO CHECK BLOOD  SUGAR DAILY 08/05/23   [name], [name], MD  diltiazem  (CARDIZEM  CD) 180 MG 24 hr capsule Take 180 mg by mouth daily.    [provider]  FEROSUL 325 (65 Fe) MG tablet Take 325 mg by mouth every other day. 10/22/23   [provider]  Glucagon  (GVOKE HYPOPEN  2-PACK) 0.5 MG/0.1ML SOAJ Inject 0.5 mg into the skin daily as needed. For Hypoglycemic  events 05/30/22   Sagardia, Miguel Jose, MD  Insulin  Lispro Prot & Lispro (HUMALOG  MIX 75/25 KWIKPEN) (75-25) 100 UNIT/ML Kwikpen Inject 20 Units into the skin 2 (two) times daily before a meal. 07/07/23   [name], Donell Cardinal, MD  levothyroxine  (SYNTHROID ) 112 MCG tablet Take 1 tablet (112 mcg total) by mouth daily. 11/16/23   Sagardia, Miguel Jose, MD  metoprolol  succinate  (TOPROL -XL) 100 MG 24 hr tablet Take 100 mg by mouth daily. 11/02/23 12/02/23  [provider]  rosuvastatin  (CRESTOR ) 10 MG tablet TAKE 1 TABLET BY MOUTH IN THE  EVENING 02/11/23   Purcell Emil Schanz, MD  sertraline  (ZOLOFT ) 25 MG tablet Take 25 mg by mouth daily. 10/31/23   [provider]  torsemide  (DEMADEX ) 20 MG tablet Take 1 tablet (20 mg total) by mouth daily. Please keep scheduled appointment for future refills. Thank you. 08/07/23   Fernande Elspeth BROCKS, MD  potassium chloride  (KLOR-CON ) 10 MEQ tablet Take 4 tablets (40 mEq total) by mouth daily. 12/10/21 03/06/22  [name], PA-C      Allergies    Doxycycline  and Metformin     Review of Systems   Review of Systems  Constitutional:  Negative for chills and fever.  HENT:  Negative for ear pain and sore throat.   Eyes:  Negative for pain and visual disturbance.  Respiratory:  Negative for cough and shortness of breath.   Cardiovascular:  Negative for chest pain and palpitations.  Gastrointestinal:  Negative for abdominal pain and vomiting.  Genitourinary:  Positive for dysuria. Negative for hematuria.  Musculoskeletal:  Negative for arthralgias and back pain.  Skin:  Negative for color change and rash.  Neurological:  Negative for seizures and syncope.  All other systems reviewed and are negative.   Physical Exam Updated Vital Signs BP 118/63 (BP Location: Left Arm)   Pulse 96   Temp 97.9 F (36.6 C) (Oral)   Resp 18   SpO2 95%  Physical Exam Vitals and nursing note reviewed.  Constitutional:      General: She is not in acute distress.    Appearance: Normal appearance. She is well-developed. She is obese.  HENT:     Head: Normocephalic and atraumatic.  Eyes:     Extraocular Movements: Extraocular movements intact.     Conjunctiva/sclera: Conjunctivae normal.     Pupils: Pupils are equal, round, and reactive to light.  Cardiovascular:     Rate and Rhythm: Normal rate and regular rhythm.      Heart sounds: No murmur heard. Pulmonary:     Effort: Pulmonary effort is normal. No respiratory distress.     Breath sounds: Normal breath sounds.  Abdominal:     Palpations: Abdomen is soft.     Tenderness: There is no abdominal tenderness.  Genitourinary:    Comments: Foley catheter in place.  No leakage around the Foley catheter.  Urine in the bag appears to be clear. Musculoskeletal:        General: No swelling.     Cervical back: Neck supple.  Skin:    General: Skin is warm and dry.     Capillary Refill: Capillary refill takes less than 2 seconds.  Neurological:     General: No focal deficit present.     Mental Status: She is alert and oriented to person, place, and time.  Psychiatric:        Mood and Affect: Mood normal.     ED Results / Procedures / Treatments   Labs (  all labs ordered are listed, but only abnormal results are displayed) Labs Reviewed  URINALYSIS, ROUTINE W REFLEX MICROSCOPIC - Abnormal; Notable for the following components:      Result Value   APPearance CLOUDY (*)    Hgb urine dipstick LARGE (*)    Leukocytes,Ua LARGE (*)    All other components within normal limits  URINALYSIS, MICROSCOPIC (REFLEX) - Abnormal; Notable for the following components:   Bacteria, UA MANY (*)    All other components within normal limits  URINE CULTURE    EKG None  Radiology No results found.  Procedures Procedures    Medications Ordered in ED Medications - No data to display  ED Course/ Medical Decision Making/ A&P                                 Medical Decision Making Amount and/or Complexity of Data Reviewed Labs: ordered.   Patient appears nontoxic no acute distress.  Temp here is 97 point.  Blood pressure 118/63 oxygen saturation 95% on room air respirations 18.  Urinalysis taken out in triage was taken from the Foley bag.  So that had many bacteria and greater than 50 white blood cells.  Patient does appear to be nontoxic.  Will get a clean  culture.  Send culture.  Had discussion with family about starting her on a different antibiotic they think they will wait for the culture results because she seems to be doing well.  And there is a home nurse coming out.   Final Clinical Impression(s) / ED Diagnoses Final diagnoses:  Problem with Foley catheter, initial encounter [facility])    Rx / DC Orders ED Discharge Orders     None         Geraldene Hamilton, MD 11/27/23 2005

## 2023-11-27 NOTE — ED Notes (Signed)
 Pt has foley cath in place, urine cloudy.  Pt describes discomfort and some leaking around foley earlier.  No leaking at this time.  Foley is draining well.  Urine sample sent from triage was out of bottom of foley, EDP aware

## 2023-11-28 DIAGNOSIS — E1122 Type 2 diabetes mellitus with diabetic chronic kidney disease: Secondary | ICD-10-CM | POA: Diagnosis not present

## 2023-11-28 DIAGNOSIS — I5032 Chronic diastolic (congestive) heart failure: Secondary | ICD-10-CM | POA: Diagnosis not present

## 2023-11-28 DIAGNOSIS — Z794 Long term (current) use of insulin: Secondary | ICD-10-CM | POA: Diagnosis not present

## 2023-11-28 DIAGNOSIS — I130 Hypertensive heart and chronic kidney disease with heart failure and stage 1 through stage 4 chronic kidney disease, or unspecified chronic kidney disease: Secondary | ICD-10-CM | POA: Diagnosis not present

## 2023-11-28 DIAGNOSIS — E1165 Type 2 diabetes mellitus with hyperglycemia: Secondary | ICD-10-CM | POA: Diagnosis not present

## 2023-11-28 DIAGNOSIS — N1832 Chronic kidney disease, stage 3b: Secondary | ICD-10-CM | POA: Diagnosis not present

## 2023-11-28 DIAGNOSIS — I480 Paroxysmal atrial fibrillation: Secondary | ICD-10-CM | POA: Diagnosis not present

## 2023-11-28 DIAGNOSIS — M150 Primary generalized (osteo)arthritis: Secondary | ICD-10-CM | POA: Diagnosis not present

## 2023-11-28 DIAGNOSIS — J984 Other disorders of lung: Secondary | ICD-10-CM | POA: Diagnosis not present

## 2023-11-30 ENCOUNTER — Telehealth: Payer: Self-pay | Admitting: Emergency Medicine

## 2023-11-30 DIAGNOSIS — E1122 Type 2 diabetes mellitus with diabetic chronic kidney disease: Secondary | ICD-10-CM | POA: Diagnosis not present

## 2023-11-30 DIAGNOSIS — I5032 Chronic diastolic (congestive) heart failure: Secondary | ICD-10-CM | POA: Diagnosis not present

## 2023-11-30 DIAGNOSIS — I480 Paroxysmal atrial fibrillation: Secondary | ICD-10-CM | POA: Diagnosis not present

## 2023-11-30 DIAGNOSIS — J984 Other disorders of lung: Secondary | ICD-10-CM | POA: Diagnosis not present

## 2023-11-30 DIAGNOSIS — M150 Primary generalized (osteo)arthritis: Secondary | ICD-10-CM | POA: Diagnosis not present

## 2023-11-30 DIAGNOSIS — Z794 Long term (current) use of insulin: Secondary | ICD-10-CM | POA: Diagnosis not present

## 2023-11-30 DIAGNOSIS — N1832 Chronic kidney disease, stage 3b: Secondary | ICD-10-CM | POA: Diagnosis not present

## 2023-11-30 DIAGNOSIS — I130 Hypertensive heart and chronic kidney disease with heart failure and stage 1 through stage 4 chronic kidney disease, or unspecified chronic kidney disease: Secondary | ICD-10-CM | POA: Diagnosis not present

## 2023-11-30 DIAGNOSIS — E1165 Type 2 diabetes mellitus with hyperglycemia: Secondary | ICD-10-CM | POA: Diagnosis not present

## 2023-11-30 LAB — URINE CULTURE

## 2023-11-30 NOTE — Telephone Encounter (Signed)
Copied from CRM [id number]. Topic: Referral - Request for Referral >> Nov 30, 2023  2:52 PM Gurney Maxin H wrote: Did the patient discuss referral with their provider in the last year? Yes (If No - schedule appointment) (If Yes - send message)  Appointment offered? No  Type of order/referral and detailed reason for visit: Urologist/Urine infection, patient has a catheter  Preference of office, provider, location: Not sure wants provider to refer  If referral order, have you been seen by this specialty before? No (If Yes, this issue or another issue? When? Where?  Can we respond through MyChart? No

## 2023-11-30 NOTE — Telephone Encounter (Signed)
 Okay to refer as requested.  Visiting nurse can also remove foley catheter in the meantime.

## 2023-12-01 ENCOUNTER — Other Ambulatory Visit: Payer: Self-pay | Admitting: *Deleted

## 2023-12-01 ENCOUNTER — Telehealth (HOSPITAL_BASED_OUTPATIENT_CLINIC_OR_DEPARTMENT_OTHER): Payer: Self-pay

## 2023-12-01 ENCOUNTER — Other Ambulatory Visit: Payer: Self-pay | Admitting: Radiology

## 2023-12-01 DIAGNOSIS — E1165 Type 2 diabetes mellitus with hyperglycemia: Secondary | ICD-10-CM | POA: Diagnosis not present

## 2023-12-01 DIAGNOSIS — I130 Hypertensive heart and chronic kidney disease with heart failure and stage 1 through stage 4 chronic kidney disease, or unspecified chronic kidney disease: Secondary | ICD-10-CM | POA: Diagnosis not present

## 2023-12-01 DIAGNOSIS — E1169 Type 2 diabetes mellitus with other specified complication: Secondary | ICD-10-CM

## 2023-12-01 DIAGNOSIS — J984 Other disorders of lung: Secondary | ICD-10-CM | POA: Diagnosis not present

## 2023-12-01 DIAGNOSIS — I5042 Chronic combined systolic (congestive) and diastolic (congestive) heart failure: Secondary | ICD-10-CM

## 2023-12-01 DIAGNOSIS — R531 Weakness: Secondary | ICD-10-CM

## 2023-12-01 DIAGNOSIS — N390 Urinary tract infection, site not specified: Secondary | ICD-10-CM

## 2023-12-01 DIAGNOSIS — I5032 Chronic diastolic (congestive) heart failure: Secondary | ICD-10-CM | POA: Diagnosis not present

## 2023-12-01 DIAGNOSIS — I480 Paroxysmal atrial fibrillation: Secondary | ICD-10-CM | POA: Diagnosis not present

## 2023-12-01 DIAGNOSIS — R600 Localized edema: Secondary | ICD-10-CM

## 2023-12-01 DIAGNOSIS — Z87440 Personal history of urinary (tract) infections: Secondary | ICD-10-CM

## 2023-12-01 DIAGNOSIS — E1122 Type 2 diabetes mellitus with diabetic chronic kidney disease: Secondary | ICD-10-CM | POA: Diagnosis not present

## 2023-12-01 DIAGNOSIS — N1832 Chronic kidney disease, stage 3b: Secondary | ICD-10-CM | POA: Diagnosis not present

## 2023-12-01 DIAGNOSIS — M150 Primary generalized (osteo)arthritis: Secondary | ICD-10-CM | POA: Diagnosis not present

## 2023-12-01 DIAGNOSIS — Z794 Long term (current) use of insulin: Secondary | ICD-10-CM | POA: Diagnosis not present

## 2023-12-01 NOTE — Telephone Encounter (Signed)
Referral placed.

## 2023-12-01 NOTE — Telephone Encounter (Signed)
Post ED Visit - Positive Culture Follow-up: Successful Patient Follow-Up  Culture assessed and recommendations reviewed by:  [x]  Ruben Im, Pharm.D. []  Celedonio Miyamoto, Pharm.D., BCPS AQ-ID []  Garvin Fila, Pharm.D., BCPS []  Georgina Pillion, Pharm.D., BCPS []  [place], [address].D., BCPS, AAHIVP []  Estella Husk, Pharm.D., BCPS, AAHIVP []  Lysle Pearl, PharmD, BCPS []  Phillips Climes, PharmD, BCPS []  Agapito Games, PharmD, BCPS []  Verlan Friends, PharmD  Positive urine culture  []  Patient discharged without antimicrobial prescription and treatment is now indicated [x]  Organism is resistant to prescribed ED discharge antimicrobial []  Patient with positive blood cultures  Changes discussed with ED provider: Pricilla Loveless, MD New antibiotic prescription Fluconazole 100 mg po now and repeat x1 in 72 hours Called to [facility] pharmacy in [place]   Contacted patient, date 12/01/2023, time 11:30 am   Sandria Senter 12/01/2023, 11:38 AM

## 2023-12-01 NOTE — Telephone Encounter (Signed)
Copied from CRM [id number]. Topic: Referral - Status >> Dec 01, 2023 12:50 PM Martinique E wrote: Reason for CRM: Luisa Hart from [facility] called requesting the status of a Urology referral for patient. Callback number for Luisa Hart is [phone] to confirm the status of this referral.

## 2023-12-01 NOTE — Progress Notes (Signed)
 ED Antimicrobial Stewardship Positive Culture Follow Up   [name] is an 83 y.o. female who presented to [facility] on 11/27/2023 with a chief complaint of No chief complaint on file.   Recent Results (from the past 720 hours)  Urine Culture     Status: Abnormal   Collection Time: 11/27/23  8:13 PM   Specimen: Urine, Catheterized  Result Value Ref Range Status   Specimen Description   Final    URINE, CATHETERIZED Performed at [facility], [address] [place], [place] [address]    Special Requests   Final    NONE Performed at [facility], [address] [place], [place] [address]    Culture >=100,000 COLONIES/mL YEAST (A)  Final   Report Status 11/30/2023 FINAL  Final     [x]  Patient discharged originally without antimicrobial agent and treatment is now indicated  New antibiotic prescription: Fluconazole 100mg  PO x1 then repeat in 72 hours  ED Provider: Glendia Breeding, MD  Koren Or, PharmD Clinical Pharmacist 12/01/2023 10:13 AM Please check AMION for all [facility] numbers

## 2023-12-01 NOTE — Patient Outreach (Signed)
Care Management  Transitions of Care Program Transitions of Care Post-discharge week 2/ day # 8   12/01/2023 Name: [name] MRN: [id number] DOB: [birth year]  Subjective: [name] is a 83 y.o. year old female who is a primary care patient of Sagardia, Eilleen Kempf, MD. The Care Management team Engaged with patient Engaged with patient by telephone to assess and address transitions of care needs.   Consent to Services:  Patient was given information about care management services, agreed to services, and gave verbal consent to participate.  Re- Enrolled into TOC 30-day program on 11/23/23  Assessment: per patient/ son [name] Needle: "I am doing much better; we went to see Dr. Alvy Bimler and he straightened out all of my medicines, I am taking them the way they told me to after the visit on 11/24/23.  We did not understand that the home health team will only be coming short-term; we will call the palliative care team and get them started now that we understand.  I still have this foley catheter in, I will talk to the home health nurse about removing it"  Denies clinical concerns today and sounds to be in no distress throughout entirety of TOC 30-day program call          SDOH Interventions    Flowsheet Row Telephone from 11/23/2023 in [place] POPULATION HEALTH DEPARTMENT Most recent reading at 11/23/2023 11:46 AM Clinical Support from 11/23/2023 in [facility] at [place] Most recent reading at 11/23/2023 10:36 AM Telephone from 11/05/2023 in [place] POPULATION HEALTH DEPARTMENT Most recent reading at 11/05/2023 10:30 AM Clinical Support from 11/21/2022 in [facility] at [place] Most recent reading at 11/21/2022 10:30 AM Chronic Care Management from 06/02/2022 in [facility] at [place] Most recent reading at 06/02/2022 11:25 AM Chronic Care Management from 04/08/2022 in [facility] at [place] Most  recent reading at 04/08/2022 10:30 AM  SDOH Interventions        Food Insecurity Interventions Patient Declined, Other (Comment)  [already addressed earlier today- confirmed community resource care guide referral placed] -- Intervention Not Indicated Intervention Not Indicated Intervention Not Indicated Intervention Not Indicated  [continues to deny food insecurity]  Housing Interventions Intervention Not Indicated Intervention Not Indicated Intervention Not Indicated Intervention Not Indicated  [continues to reside with spouse in single family home,  denies housing concerns] -- Intervention Not Indicated  [continues to reside with spouse in single family home,  denies housing concerns]  Transportation Interventions Intervention Not Indicated  [During TOC call today- patient reports husband to provide transportation moving forward] Intervention Not Indicated Intervention Not Indicated  [Reports friend "[name]" usually provides transportation,  husband also assists as needed/ indicated] Intervention Not Indicated  [patient reports drives self "sometimes, " also has transportation assistance as needed from spouse/ friends] Intervention Not Indicated  [patient reports drives self "sometimes, " also has transportation assistance as needed from spouse/ friends] Intervention Not Indicated  [Patient continues to drive self]  Utilities Interventions Intervention Not Indicated Intervention Not Indicated Intervention Not Indicated Intervention Not Indicated -- --  Alcohol Usage Interventions -- Intervention Not Indicated (Score <7) -- Intervention Not Indicated (Score <7) -- --  Depression Interventions/Treatment  -- UXL2-4 Score <4 Follow-up Not Indicated -- -- -- --  Financial Strain Interventions -- -- -- Intervention Not Indicated -- --  Physical Activity Interventions -- Intervention Not Indicated -- Intervention Not Indicated, Patient Refused -- --  Stress Interventions -- Intervention Not  Indicated --  Intervention Not Indicated -- --  Social Connections Interventions -- Intervention Not Indicated -- Intervention Not Indicated -- --  Health Literacy Interventions -- Intervention Not Indicated -- -- -- --       Goals Addressed             This Visit's Progress    TOC 30-day Program Care Plan   On track    Current Barriers:  Medication management -- needs full medication review post-recent hospital discharge on 11/04/23 and 11/20/23: husband manages all medications- places in 7-day pill planner box; patient then takes independently; on initial TOC call for both discharges, patient and spouse/ caregiver verbalizes SIGNIFICANT confusion around medications 11/20/23: attempted to complete medication review: patient/ spouse became very frustrated and appear to have significant confusion around medications- post multiple recent hospitalizations; they decline further review today and state they will discuss with PCP during tomorrow's hospital follow up office visit Made PCP aware of patient's and spouse's confusion around medications 11/30/22: Full medication review completed post- PCP office visit on 11/24/23:  patient/ son [name] Needle report much better understanding of medications during phone call today Multiple recent hospitalizations: at time of most recent hospital discharge on 11/20/23: hospice recommended- patient and spouse agreed: however, when I contacted [facility]- they report patient and spouse declined all services; husband and patient report today "they do not believe" that patient needs hospice care at this time 12/01/23: Patient/ son [name] Needle report that "hospice have been circling like vultures;" apparently they have been contacted by Palliative Care but did not know difference between Palliative Care and Hospice-- education provided: provided contact information for Palliative care team ([facility]: [phone]) as per PCP referral on 11/24/23- encouraged them to call and  schedule appointment for initiation of services- they verbalize agreement/ understanding Home Health services through [facility] for PT/ OT/ RN/ SW [phone]- per outside hospital notes, "Inetta Fermo")- previously ordered at time of hospital discharge on 11/04/23: not ordered post-hospital discharge on 11/20/23- as patient was set up with home hospice services- which she has since declined: made PCP aware of same, in cae he would like to re-order home health services 12/01/23: Confirmed patient is now active with [facility] home health agency, post- PCP office visit on 11/24/23: reports team has been making regular home visits: "its going well, they come out a couple of times each week" 11/05/23 TOC call:  Equipment/DME confirmed patient obtained newly ordered NIV prior to hospital discharge and is using as instructed  Multiple recent inpatient hospitalizations and SNF rehabilitation visits:  Most recent discharge on 11/04/23-- admitted to hospital from SNF/ rehabilitation facility; 12-day inpatient hospitalization 11/20/23: hospital re-admission within 30 days: 1/03-15, 2025 and 01/22-31, 2025: both for VHF/ chronic respiratory failure  RNCM Clinical Goal(s):  Patient will work with the Care Management team over the next 30 days to address Transition of Care Barriers: Medication Management Support at home Home Health services take all medications exactly as prescribed and will call provider for medication related questions as evidenced by review of medication with TOC 30-day program RN CM  attend all scheduled medical appointments: 11/24/23- PCP: HFU; 12/07/23- [facility] cardiology provider- per patient/ spouse report provider as evidenced by review of same with patient during TOC 30-day RN CM weekly outreaches not experience hospital admission as evidenced by review of EMR. Hospital Admissions in last 6 months = 4 inpatient admissions; 1 SNF-rehab admission  through collaboration with RN Care manager, provider,  and care team.   Interventions: Evaluation of  current treatment plan related to  self management and patient's adherence to plan as established by provider  Transitions of Care:  Goal on track:  Yes.  12/01/23 Doctor Visits  - discussed the importance of doctor visits Discussed care of/ maintenance of  foley catheter at home: son reports he called PCP office yesterday for urology referral; confirmed it appears from review of EHR that this referral is in progress- also read to patient and son that PCP wrote it is okay for home health nurse to remove foley catheter at time of next home visit: they will discuss with home health nurse and this was encouraged Reviewed PCP HFU OV on 11/24/23- Full medication review completed post- PCP office visit on 11/24/23:  patient/ son [name] Needle report much better understanding of medications during phone call today Provided extensive education around difference between hospice- palliative care- home health services: now that difference in services have been explained, son [name] Needle states that he will re-contact Palliative Care team to initiate services: provided contact information for [facility], as per referral notes from PCP office    Patient Goals/Self-Care Activities: goals per last outreach on 11/05/23: Participate in Transition of Care Program/Attend [facility] scheduled calls Take all medications as prescribed Attend all scheduled provider appointments Call provider office for new concerns or questions  Continue pacing activity to avoid episodes of shortness of breath Use assistive devices as needed to prevent falls- your walker and bedside commode Continue using the new non-invasive ventilator that you were given at the hospital before your discharge home Eat a heart healthy and low salt diet If you believe your condition is getting worse- contact your care providers (doctors) promptly- reaching out to your doctor early when you have concerns can prevent you  from having to go to the hospital  Follow Up Plan:  Telephone follow up appointment with care management team member scheduled for:  Friday 12/11/23 at 10:00 am         Plan: Telephone follow up appointment with care management team member scheduled for:   Friday 12/11/23 at 10:00 am  Total time spent from review to signing of note/ including any care coordination interventions:  73 minutes/ slight language- cultural barrier: need for ongoing extensive reinforcement around medications  Caryl Pina, RN, BSN, CCRN Alumnus RN Care Manager  Transitions of Care  [facility] - [facility] [phone]: direct office

## 2023-12-01 NOTE — Telephone Encounter (Signed)
Yes

## 2023-12-02 ENCOUNTER — Other Ambulatory Visit: Payer: Self-pay | Admitting: Radiology

## 2023-12-02 ENCOUNTER — Telehealth: Payer: Self-pay | Admitting: Radiology

## 2023-12-02 DIAGNOSIS — I5032 Chronic diastolic (congestive) heart failure: Secondary | ICD-10-CM | POA: Diagnosis not present

## 2023-12-02 DIAGNOSIS — M150 Primary generalized (osteo)arthritis: Secondary | ICD-10-CM | POA: Diagnosis not present

## 2023-12-02 DIAGNOSIS — N1832 Chronic kidney disease, stage 3b: Secondary | ICD-10-CM | POA: Diagnosis not present

## 2023-12-02 DIAGNOSIS — I130 Hypertensive heart and chronic kidney disease with heart failure and stage 1 through stage 4 chronic kidney disease, or unspecified chronic kidney disease: Secondary | ICD-10-CM | POA: Diagnosis not present

## 2023-12-02 DIAGNOSIS — I480 Paroxysmal atrial fibrillation: Secondary | ICD-10-CM | POA: Diagnosis not present

## 2023-12-02 DIAGNOSIS — N390 Urinary tract infection, site not specified: Secondary | ICD-10-CM

## 2023-12-02 DIAGNOSIS — E1122 Type 2 diabetes mellitus with diabetic chronic kidney disease: Secondary | ICD-10-CM | POA: Diagnosis not present

## 2023-12-02 DIAGNOSIS — E1165 Type 2 diabetes mellitus with hyperglycemia: Secondary | ICD-10-CM | POA: Diagnosis not present

## 2023-12-02 DIAGNOSIS — J984 Other disorders of lung: Secondary | ICD-10-CM | POA: Diagnosis not present

## 2023-12-02 DIAGNOSIS — Z794 Long term (current) use of insulin: Secondary | ICD-10-CM | POA: Diagnosis not present

## 2023-12-02 NOTE — Telephone Encounter (Signed)
Placed  d/c foley order.

## 2023-12-03 NOTE — Addendum Note (Signed)
Addended by: Aundra Millet on: 12/03/2023 03:05 PM   Modules accepted: Orders

## 2023-12-03 NOTE — Telephone Encounter (Signed)
Patient son was contacted and made aware referral was placed and urology referral has been faxed

## 2023-12-03 NOTE — Telephone Encounter (Signed)
Copied from CRM [id number]. Topic: Referral - Status >> Dec 03, 2023  1:18 PM Elizebeth Brooking wrote: Reason for CRM: Patient called in regarding the status update of the urologist referral, also would like to know when the catheter will be removed . Is requesting a callback regarding this issue

## 2023-12-04 ENCOUNTER — Ambulatory Visit: Payer: Medicare HMO | Admitting: Family Medicine

## 2023-12-07 ENCOUNTER — Ambulatory Visit: Payer: Self-pay | Admitting: Emergency Medicine

## 2023-12-07 DIAGNOSIS — N1832 Chronic kidney disease, stage 3b: Secondary | ICD-10-CM | POA: Diagnosis not present

## 2023-12-07 DIAGNOSIS — J984 Other disorders of lung: Secondary | ICD-10-CM | POA: Diagnosis not present

## 2023-12-07 DIAGNOSIS — E1122 Type 2 diabetes mellitus with diabetic chronic kidney disease: Secondary | ICD-10-CM | POA: Diagnosis not present

## 2023-12-07 DIAGNOSIS — I5032 Chronic diastolic (congestive) heart failure: Secondary | ICD-10-CM | POA: Diagnosis not present

## 2023-12-07 DIAGNOSIS — I480 Paroxysmal atrial fibrillation: Secondary | ICD-10-CM | POA: Diagnosis not present

## 2023-12-07 DIAGNOSIS — I4811 Longstanding persistent atrial fibrillation: Secondary | ICD-10-CM | POA: Diagnosis not present

## 2023-12-07 DIAGNOSIS — I10 Essential (primary) hypertension: Secondary | ICD-10-CM | POA: Diagnosis not present

## 2023-12-07 DIAGNOSIS — Z794 Long term (current) use of insulin: Secondary | ICD-10-CM | POA: Diagnosis not present

## 2023-12-07 DIAGNOSIS — N179 Acute kidney failure, unspecified: Secondary | ICD-10-CM | POA: Diagnosis not present

## 2023-12-07 DIAGNOSIS — Z1330 Encounter for screening examination for mental health and behavioral disorders, unspecified: Secondary | ICD-10-CM | POA: Diagnosis not present

## 2023-12-07 DIAGNOSIS — M150 Primary generalized (osteo)arthritis: Secondary | ICD-10-CM | POA: Diagnosis not present

## 2023-12-07 DIAGNOSIS — E1165 Type 2 diabetes mellitus with hyperglycemia: Secondary | ICD-10-CM | POA: Diagnosis not present

## 2023-12-07 DIAGNOSIS — E785 Hyperlipidemia, unspecified: Secondary | ICD-10-CM | POA: Diagnosis not present

## 2023-12-07 DIAGNOSIS — Z515 Encounter for palliative care: Secondary | ICD-10-CM | POA: Diagnosis not present

## 2023-12-07 DIAGNOSIS — I130 Hypertensive heart and chronic kidney disease with heart failure and stage 1 through stage 4 chronic kidney disease, or unspecified chronic kidney disease: Secondary | ICD-10-CM | POA: Diagnosis not present

## 2023-12-07 NOTE — Telephone Encounter (Signed)
 Copied from CRM [id number]. Topic: Clinical - Red Word Triage >> Dec 07, 2023 10:00 AM Pascal Lux wrote: Red Word that prompted transfer to Nurse Triage: Patient has a catheter and is experiencing burning and pain when urinating, the home health aide stated the provider has to give the order to remove it.  Chief Complaint: pain with urinary catheter Symptoms: pain & burning with urination, pain at cath insertion point all the time -especially with movement Frequency: 11/27/2023 Pertinent Negatives: Patient denies fever, blood urine Disposition: [] ED /[] Urgent Care (no appt availability in office) / [x] Appointment(In office/virtual)/ []  [place] [facility]/ [] Home Care/ [] Refused Recommended Disposition /[] [place] [facility]/ []  Follow-up with PCP Additional Notes: cath on in hospital Cath every since discharged from  hospital . Had kidney infection: on ABX & completed them. Pt stated went to ED on 11/27/23 due to urinary s/s: urine collected & has not gotten urine results: pt would like someone to call her r/t results- pt thinks she needs ABX.  Pt would like to remove cath due to discomfort/pain/burning.  Pt has appt scheduled 12/08/23 for EKG: would like to know if she needs to keep appt & why. Please call pt.  Answer Assessment - Initial Assessment Questions 1. SYMPTOMS: "What symptoms are you concerned about?"     Pain 8/10- at the point of insertion 2. ONSET:  "When did the symptoms start?"     X 1 week 3. FEVER: "Is there a fever?" If Yes, ask: "What is the temperature, how was it measured, and when did it start?"     no 4. ABDOMEN PAIN: "Is there any abdomen pain?" (e.g., Scale 1-10; or mild, moderate, severe)     no 5. URINE COLOR: "What color is the urine?"  "Is there blood present in the urine?" (e.g., clear, yellow, cloudy, tea-colored, blood streaks, bright red)     clear 6. URINE AMOUNT: "When did you last empty the urine from the collection bag?" "How much urine was in the bag at that  time?" How much urine is in the collection bag now?"     N/a 7. INSERTION: "How long have you (they) had the catheter?"     Cath was in during hospital stay and pt discharged home with cath 8. OTHER SYMPTOMS: "Are there any other symptoms?" (e.g., abdomen swelling, back pain, bladder spasms, constipation, foul smelling urine, leaking of urine)     Some back pain - 6/10 lower back middle and right side 9. MEDICINES: "Are you taking any medicines to treat urinary problems?" (e.g., antibiotics for a urinary tract infection, medicines to treat bladder spasms)      no 10. PREGNANCY: "Is there any chance you are pregnant?" "When was your last menstrual period?"       N/a  Protocols used: Urinary Catheter (e.g., Foley) Symptoms and Questions-A-AH

## 2023-12-07 NOTE — Progress Notes (Deleted)
  Electrophysiology Office Note:   Date:  12/07/2023  ID:  [name], DOB Dec 02, 1940, MRN [id number]  Primary Cardiologist: None Electrophysiologist: Sherryl Manges, MD  {Click to update primary MD,subspecialty MD or APP then REFRESH:1}    History of Present Illness:   [name] is a 83 y.o. female with h/o chronic diastolic CHF, persistent AF, HTN, and obesity seen today for routine electrophysiology followup.   Seen in HF clinic 12/3 with AF in 160s. She requested to go back home and be admitted closer there, in [place]. By records was changed over to amiodarone. ***  Since last being seen in our clinic the patient reports doing ***.  she denies chest pain, palpitations, dyspnea, PND, orthopnea, nausea, vomiting, dizziness, syncope, edema, weight gain, or early satiety.   Review of systems complete and found to be negative unless listed in HPI.   EP Information / Studies Reviewed:    EKG is ordered today. Personal review as below.       Arrhythmia/Device History No specialty comments available.   Physical Exam:   VS:  There were no vitals taken for this visit.   Wt Readings from Last 3 Encounters:  11/24/23 238 lb (108 kg)  11/23/23 260 lb (117.9 kg)  09/22/23 260 lb (117.9 kg)     GEN: No acute distress NECK: No JVD; No carotid bruits CARDIAC: {EPRHYTHM:28826}, no murmurs, rubs, gallops RESPIRATORY:  Clear to auscultation without rales, wheezing or rhonchi  ABDOMEN: Soft, non-tender, non-distended EXTREMITIES:  {EDEMA LEVEL:28147::"No"} edema; No deformity   ASSESSMENT AND PLAN:    Persistent atrial fibrillation EKG today shows *** on  *** Would not restart multaq.   Chronic diastolic CHF Follows with HF clinic  HTN Stable on current regimen   Obesity There is no height or weight on file to calculate BMI.  Encouraged lifestyle modification   {Click here to Review PMH, Prob List, Meds, Allergies, SHx, FHx  :1}   Follow up with {JYNWG:95621}  {EPFOLLOW HY:86578}  Signed, Graciella Freer, PA-C

## 2023-12-08 ENCOUNTER — Ambulatory Visit: Payer: Medicare HMO | Admitting: Emergency Medicine

## 2023-12-08 ENCOUNTER — Ambulatory Visit: Payer: Medicare HMO | Attending: Student | Admitting: Student

## 2023-12-08 DIAGNOSIS — N1832 Chronic kidney disease, stage 3b: Secondary | ICD-10-CM | POA: Diagnosis not present

## 2023-12-08 DIAGNOSIS — E1122 Type 2 diabetes mellitus with diabetic chronic kidney disease: Secondary | ICD-10-CM | POA: Diagnosis not present

## 2023-12-08 DIAGNOSIS — I130 Hypertensive heart and chronic kidney disease with heart failure and stage 1 through stage 4 chronic kidney disease, or unspecified chronic kidney disease: Secondary | ICD-10-CM | POA: Diagnosis not present

## 2023-12-08 DIAGNOSIS — I4891 Unspecified atrial fibrillation: Secondary | ICD-10-CM

## 2023-12-08 DIAGNOSIS — G4733 Obstructive sleep apnea (adult) (pediatric): Secondary | ICD-10-CM

## 2023-12-08 DIAGNOSIS — M150 Primary generalized (osteo)arthritis: Secondary | ICD-10-CM | POA: Diagnosis not present

## 2023-12-08 DIAGNOSIS — J984 Other disorders of lung: Secondary | ICD-10-CM | POA: Diagnosis not present

## 2023-12-08 DIAGNOSIS — I10 Essential (primary) hypertension: Secondary | ICD-10-CM

## 2023-12-08 DIAGNOSIS — Z794 Long term (current) use of insulin: Secondary | ICD-10-CM | POA: Diagnosis not present

## 2023-12-08 DIAGNOSIS — I5032 Chronic diastolic (congestive) heart failure: Secondary | ICD-10-CM

## 2023-12-08 DIAGNOSIS — E1165 Type 2 diabetes mellitus with hyperglycemia: Secondary | ICD-10-CM | POA: Diagnosis not present

## 2023-12-08 DIAGNOSIS — N3020 Other chronic cystitis without hematuria: Secondary | ICD-10-CM | POA: Diagnosis not present

## 2023-12-08 DIAGNOSIS — I480 Paroxysmal atrial fibrillation: Secondary | ICD-10-CM | POA: Diagnosis not present

## 2023-12-08 DIAGNOSIS — R338 Other retention of urine: Secondary | ICD-10-CM | POA: Diagnosis not present

## 2023-12-09 ENCOUNTER — Ambulatory Visit: Payer: Self-pay | Admitting: Licensed Clinical Social Worker

## 2023-12-09 DIAGNOSIS — N1832 Chronic kidney disease, stage 3b: Secondary | ICD-10-CM | POA: Diagnosis not present

## 2023-12-09 DIAGNOSIS — M150 Primary generalized (osteo)arthritis: Secondary | ICD-10-CM | POA: Diagnosis not present

## 2023-12-09 DIAGNOSIS — I480 Paroxysmal atrial fibrillation: Secondary | ICD-10-CM | POA: Diagnosis not present

## 2023-12-09 DIAGNOSIS — I5032 Chronic diastolic (congestive) heart failure: Secondary | ICD-10-CM | POA: Diagnosis not present

## 2023-12-09 DIAGNOSIS — Z794 Long term (current) use of insulin: Secondary | ICD-10-CM | POA: Diagnosis not present

## 2023-12-09 DIAGNOSIS — E1165 Type 2 diabetes mellitus with hyperglycemia: Secondary | ICD-10-CM | POA: Diagnosis not present

## 2023-12-09 DIAGNOSIS — J984 Other disorders of lung: Secondary | ICD-10-CM | POA: Diagnosis not present

## 2023-12-09 DIAGNOSIS — I130 Hypertensive heart and chronic kidney disease with heart failure and stage 1 through stage 4 chronic kidney disease, or unspecified chronic kidney disease: Secondary | ICD-10-CM | POA: Diagnosis not present

## 2023-12-09 DIAGNOSIS — E1122 Type 2 diabetes mellitus with diabetic chronic kidney disease: Secondary | ICD-10-CM | POA: Diagnosis not present

## 2023-12-09 NOTE — Patient Instructions (Signed)
 Visit Information  Thank you for taking time to visit with me today. Please don't hesitate to contact me if I can be of assistance to you.   Following are the goals we discussed today:   Goals Addressed             This Visit's Progress    Care Coordination Activities       Care Coordination Interventions:and  Patient stated that her husband is out of work and it has made things hard in a financial way and it has been has caused strain on them buying food and bills. At this time the bills are ok but food has been an issue form time to time. SW discussed the food pantry list and the patient wants a list mailed to to her. The SW will mail out the list. The husband is currently looking for a job and has some applications in at places. SW will follow up on 12/29/2023 at 2:00 pm        Our next appointment is by telephone on 12/29/2023 at 2:00 pm  Please call the care guide team at [phone] if you need to cancel or reschedule your appointment.   If you are experiencing a Mental Health or Behavioral Health Crisis or need someone to talk to, please call the Suicide and Crisis Lifeline: 988 go to [facility] [address] [place] [phone]) call 911  The patient verbalized understanding of instructions, educational materials, and care plan provided today and DECLINED offer to receive copy of patient instructions, educational materials, and care plan.   Jeanie Cooks, PhD [facility], [facility] Social Worker Direct Dial: [phone]  Fax: [phone]

## 2023-12-09 NOTE — Patient Outreach (Signed)
  Care Coordination   Initial Visit Note   12/09/2023 Name: [name] MRN: [id number] DOB: 03/19/1941  [name] is a 83 y.o. year old female who sees Sagardia, Eilleen Kempf, MD for primary care. I spoke with  [name] by phone today.  What matters to the patients health and wellness today?  Food Insecurities    Goals Addressed             This Visit's Progress    Care Coordination Activities       Care Coordination Interventions:and  Patient stated that her husband is out of work and it has made things hard in a financial way and it has been has caused strain on them buying food and bills. At this time the bills are ok but food has been an issue form time to time. SW discussed the food pantry list and the patient wants a list mailed to to her. The SW will mail out the list. The husband is currently looking for a job and has some applications in at places. SW will follow up on 12/29/2023 at 2:00 pm        SDOH assessments and interventions completed:  Yes  SDOH Interventions Today    Flowsheet Row Most Recent Value  SDOH Interventions   Food Insecurity Interventions Community Resources Provided  [Food Pantry list]  Housing Interventions Intervention Not Indicated  Transportation Interventions Intervention Not Indicated  Utilities Interventions Intervention Not Indicated  Financial Strain Interventions Community Resources Provided  [name] is out of work and looking for another job.]        Care Coordination Interventions:  Yes, provided  Interventions Today    Flowsheet Row Most Recent Value  General Interventions   General Interventions Discussed/Reviewed General Interventions Discussed, Community Resources  [SW will mail food resources to the family, husband is looking for a job]        Follow up plan: Follow up call scheduled for 12/29/2023 at 2:00 pm    Encounter Outcome:  Patient Visit Completed   Jeanie Cooks, PhD [facility] Social Worker Direct Dial: [phone]  Fax: [phone]

## 2023-12-10 ENCOUNTER — Ambulatory Visit: Payer: Self-pay | Admitting: Internal Medicine

## 2023-12-11 ENCOUNTER — Ambulatory Visit: Payer: Self-pay | Admitting: Internal Medicine

## 2023-12-11 ENCOUNTER — Other Ambulatory Visit: Payer: Self-pay | Admitting: *Deleted

## 2023-12-11 VITALS — BP 120/72 | HR 82 | Ht 61.0 in | Wt 245.0 lb

## 2023-12-11 DIAGNOSIS — Z794 Long term (current) use of insulin: Secondary | ICD-10-CM

## 2023-12-11 DIAGNOSIS — E1165 Type 2 diabetes mellitus with hyperglycemia: Secondary | ICD-10-CM | POA: Diagnosis not present

## 2023-12-11 DIAGNOSIS — E1142 Type 2 diabetes mellitus with diabetic polyneuropathy: Secondary | ICD-10-CM | POA: Diagnosis not present

## 2023-12-11 DIAGNOSIS — E039 Hypothyroidism, unspecified: Secondary | ICD-10-CM | POA: Diagnosis not present

## 2023-12-11 LAB — POCT GLYCOSYLATED HEMOGLOBIN (HGB A1C): Hemoglobin A1C: 7.4 % — AB (ref 4.0–5.6)

## 2023-12-11 LAB — POCT GLUCOSE (DEVICE FOR HOME USE): POC Glucose: 199 mg/dL — AB (ref 70–99)

## 2023-12-11 NOTE — Patient Instructions (Signed)
-   Take   Humalog Mix 20 units with Breakfast and 20 units with Supper    HOW TO TREAT LOW BLOOD SUGARS (Blood sugar LESS THAN 70 MG/DL) Please follow the RULE OF 15 for the treatment of hypoglycemia treatment (when your (blood sugars are less than 70 mg/dL)   STEP 1: Take 15 grams of carbohydrates when your blood sugar is low, which includes:  3-4 GLUCOSE TABS  OR 3-4 OZ OF JUICE OR REGULAR SODA OR ONE TUBE OF GLUCOSE GEL    STEP 2: RECHECK blood sugar in 15 MINUTES STEP 3: If your blood sugar is still low at the 15 minute recheck --> then, go back to STEP 1 and treat AGAIN with another 15 grams of carbohydrates.

## 2023-12-11 NOTE — Progress Notes (Signed)
 Name: [name]  Age/ Sex: 83 y.o., female   MRN/ DOB: [id number], Jul 16, 1941     PCP: Georgina Quint, MD   Reason for Endocrinology Evaluation: Type 2 Diabetes Mellitus  Initial Endocrine Consultative Visit: 06/21/2020    PATIENT IDENTIFIER: [name] is a 83 y.o. female with a past medical history of HTN, PAF, CHF, OSA and Dyslipidemia . The patient has followed with Endocrinology clinic since 06/21/2020 for consultative assistance with management of her diabetes.  DIABETIC HISTORY:  [name] was diagnosed with DM in 2011,she is intolerant to metformin due to diarrhea. Her hemoglobin A1c has ranged from 7.3% in 2011, peaking at 9.5% in 2014.   ADRENAL HISTORY: She was noted with an incidental finding of right adrenal adenoma ~ 4 cm on CT imaging and 09/2010 during evaluation of abdominal pain.  This has been stable on CT imaging of abdomen 03/2020  She had normal saliva cortisol 05/2021  She had normal normetanephrine and metanephrines 01/2022   Patient has primary hyperaldosteronism with an elevated Aldo/PRA ratio of 63.6, low renin activity at 0.11 (in the setting of ARB) and a normal level aldosterone of 7.  Would not proceed with invasive testing due to advanced age and cardiovascular history We have opted to treat medically with spironolactone which was started in May 2023  This was discontinued during hospitalization for CHF/A-fib 10/2023 due to hyperkalemia  Mounjaro discontinued during hospitalization 10/2023  SUBJECTIVE:   During the last visit (07/07/2023): A1c 8.9 %    Today (12/11/2023): [name] is here for follow-up on diabetes management.  She is accompanied by her friend.  She checks her blood sugars multiple times a day  times daily, through CGM . The patient has  not had hypoglycemic episodes since the last clinic visit, she self reduced    Pt had recent admission for CHF and A.Fib 10/2023, spironolactone and Mounjaro were discontinued  at the time Patient had a Foley catheter during hospitalization for pyelonephritis  She had a follow-up with cardiology 11/2023  She is accompanied by her spouse today She denies any constipation, or diarrhea Denies nausea or vomiting Denies local neck symptoms Denies palpitations No shortness of breath  HOME ENDOCRINE REGIMEN:  Humalog Mix 20 units with Breakfast and 20 units Supper      Statin: yes ACE-I/ARB: yes   CONTINUOUS GLUCOSE MONITORING RECORD INTERPRETATION    Dates of Recording: 2/10-2/23/2024  Sensor description:freestyle libre  Results statistics:   CGM use % of time 96  Average and SD 199/19  Time in range 34%  % Time Above 180 55  % Time above 250 11  % Time Below target 0     Glycemic patterns summary: BG's high during the night but increase as the day progresses Hyperglycemic episodes during the day and night but worse during the day  Hypoglycemic episodes occurred N/A  Overnight periods: high           DIABETIC COMPLICATIONS: Microvascular complications:  Neuropathy Denies: CKD, , retinopathy Last eye exam: Completed 03/30/2023   Macrovascular complications:    Denies: CAD, PVD, CVA    HISTORY:  Past Medical History:  Past Medical History:  Diagnosis Date   Arthritis    Back pain    BCC (basal cell carcinoma) 04/01/2023   right lower chin, Mohs completed on 04/21/23   Chronic anticoagulation    due to aflutter   Chronic kidney disease    Diabetes mellitus    Diastolic CHF, chronic (HCC)  a.  echo 2006 - ef 55-65%; mild diast dysfxn;    b. Echo 08/2011: Mild LVH, EF 60%;  c. 04/2013 Echo: EF 65-69%, mild conc LVH;  08/2014 Echo: EF 60-65%, mild-mod MR.   Gout    Hyperlipidemia    Hypertension    a.  Renal arterial Dopplers 12/2011: 1-59% right renal artery stenosis   Morbid obesity (HCC)    Obstructive sleep apnea on CPAP    Paroxysmal Afib/Flutter    a. 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   Past Surgical  History:  Past Surgical History:  Procedure Laterality Date   APPENDECTOMY     ATRIAL FLUTTER ABLATION N/A 09/24/2011   Procedure: ATRIAL FLUTTER ABLATION;  Surgeon: Marinus Maw, MD;  Location: [facility] CATH LAB;  Service: Cardiovascular;  Laterality: N/A;   CARDIOVERSION  10/22/2011   Procedure: CARDIOVERSION;  Surgeon: Duke Salvia, MD;  Location: [facility] OR;  Service: Cardiovascular;  Laterality: N/A;   CARDIOVERSION N/A 09/10/2011   Procedure: CARDIOVERSION;  Surgeon: Duke Salvia, MD;  Location: [facility] CATH LAB;  Service: Cardiovascular;  Laterality: N/A;   CHOLECYSTECTOMY     COLONOSCOPY WITH PROPOFOL N/A 06/13/2021   Procedure: COLONOSCOPY WITH PROPOFOL;  Surgeon: Rachael Fee, MD;  Location: [facility] ENDOSCOPY;  Service: Endoscopy;  Laterality: N/A;   POLYPECTOMY  06/13/2021   Procedure: POLYPECTOMY;  Surgeon: Rachael Fee, MD;  Location: [facility] ENDOSCOPY;  Service: Endoscopy;;   TONSILLECTOMY  1982   TOTAL ABDOMINAL HYSTERECTOMY     Social History:  reports that she has never smoked. She has never used smokeless tobacco. She reports that she does not drink alcohol and does not use drugs. Family History:  Family History  Problem Relation Age of Onset   Heart disease Father    Hypertension Father    Breast cancer Sister    Cancer Sister        breast   Colon cancer Neg Hx    Esophageal cancer Neg Hx    Pancreatic cancer Neg Hx    Liver disease Neg Hx      HOME MEDICATIONS: Allergies as of 12/11/2023       Reactions   Doxycycline    Made patient feel very ill    Metformin Diarrhea        Medication List        Accurate as of December 11, 2023  3:19 PM. If you have any questions, ask your nurse or doctor.          Accu-Chek Softclix Lancets lancets 1 each by Other route 3 (three) times daily. as directed   allopurinol 300 MG tablet Commonly known as: ZYLOPRIM TAKE 1 TABLET BY MOUTH DAILY What changed:  how much to take how to take this when to take  this additional instructions   amiodarone 400 MG tablet Commonly known as: PACERONE 400 mg daily.   BD Pen Needle Nano 2nd Gen 32G X 4 MM Misc Generic drug: Insulin Pen Needle USE TWICE DAILY AS NEEDED   blood glucose meter kit and supplies Dispense based on patient and insurance preference. Use up to four times daily as directed. (FOR ICD-10 E10.9, E11.9).   diltiazem 180 MG 24 hr capsule Commonly known as: CARDIZEM CD Take 180 mg by mouth daily.   FeroSul 325 (65 Fe) MG tablet Generic drug: ferrous sulfate Take 325 mg by mouth every other day.   FreeStyle Libre 2 Reader Idanha Use to check blood glucose. Use as directed   FreeStyle  Libre 2 Sensor Misc USE AS DIRECTED TO CHECK BLOOD  SUGAR DAILY   Gvoke HypoPen 2-Pack 0.5 MG/0.1ML Soaj Generic drug: Glucagon Inject 0.5 mg into the skin daily as needed. For Hypoglycemic events   Insulin Lispro Prot & Lispro (75-25) 100 UNIT/ML Kwikpen Commonly known as: HumaLOG Mix 75/25 KwikPen Inject 20 Units into the skin 2 (two) times daily before a meal.   levothyroxine 112 MCG tablet Commonly known as: SYNTHROID Take 1 tablet (112 mcg total) by mouth daily.   rosuvastatin 10 MG tablet Commonly known as: CRESTOR TAKE 1 TABLET BY MOUTH IN THE  EVENING   sertraline 25 MG tablet Commonly known as: ZOLOFT Take 25 mg by mouth daily.   torsemide 20 MG tablet Commonly known as: DEMADEX Take 1 tablet (20 mg total) by mouth daily. Please keep scheduled appointment for future refills. Thank you.         OBJECTIVE:   Vital Signs: BP 120/72 (BP Location: Left Arm, Patient Position: Sitting, Cuff Size: Large)   Pulse 82   Ht 5\' 1"  (1.549 m)   Wt 245 lb (111.1 kg) Comment: patient reported  SpO2 93%   BMI 46.29 kg/m    Wt Readings from Last 3 Encounters:  12/11/23 245 lb (111.1 kg)  11/24/23 238 lb (108 kg)  11/23/23 260 lb (117.9 kg)     Exam: General: Pt appears well and is in NAD, in a wheelchair  Lungs: Clear with  good BS bilat   Heart: RRR  Extremities: Trace  pretibial edema.   Neuro: MS is good with appropriate affect, pt is alert and Ox3    DM foot exam: 05/05/2023 per podiatry      DATA REVIEWED:  Lab Results  Component Value Date   HGBA1C 9.1 (A) 07/07/2023   HGBA1C 8.9 (A) 12/12/2022   HGBA1C 8.8 (A) 08/13/2022    Latest Reference Range & Units 12/11/23 15:40  TSH 0.40 - 4.50 mIU/L 2.08      Labs through Care Everywhere 11/19/2023  11/20/2023 Na 132 K 3.8 BUN 49 CR 1.92 GFR 26    Old records , labs and images have been reviewed.    ASSESSMENT / PLAN / RECOMMENDATIONS:   1) Type 2 Diabetes Mellitus, optimally controlled, With Neuropathic and CKD complications - Most recent A1c of 7.4%. Goal A1c < 7.5 %.    -Patient with worsening glycemic control - Intolerant to Metformin  -Mounjaro was discontinued during hospitalization for CHF/A-fib 10/2023 -A1c is trending down -We discussed the importance of taking Humalog mix before the meal, husband is wondering if he should give her the insulin with a BG reading of 120 Mg/DL.  I did explain to the spouse and the patient that she should take Humalog mix as long as her BG readings over 70 Mg/DL, but the dose of insulin may be decreased by 50%, if she is going to eat a smaller meal than usual to prevent hypoglycemia   MEDICATIONS:  -Take Humalog Mix 20 units with Breakfast and 20 units with Supper    EDUCATION / INSTRUCTIONS: BG monitoring instructions: Patient is instructed to check her blood sugars 3 times a day, before meals . Call [place] [facility] clinic if: BG persistently < 70 I reviewed the Rule of 15 for the treatment of hypoglycemia in detail with the patient. Literature supplied.    2) Diabetic complications:  Eye: Does not have known diabetic retinopathy.  Neuro/ Feet: Does have known diabetic peripheral neuropathy .  Renal: Patient does  not have known baseline CKD. She   is on an ACEI/ARB at  present.   3) Right Adrenal Adenoma:   -She was diagnosed with right adrenal adenoma~4 cm on CT imaging from 2011, this has remained stable based on CT scan 01/01/2022 at 4.3 cm  -She had normal saliva cortisol testing 05/2021, metanephrines and normetanephrine's -She did have suppressed renin in the setting of aldosterone >5  and elevated Aldo: Renin ratio.  Opted with medical therapy with spironolactone -She was on spironolactone but this was discontinued due to hyperkalemia   4) Hypothyroidism  She has been noted with elevated TSH  - Repeat TSH is normal, no change    Medication  Continue levothyroxine 112 mcg    F/U in 4 months    I spent 30 minutes preparing to see the patient by review of recent labs, imaging and procedures, obtaining and reviewing separately obtained history, communicating with the patient/family or caregiver, ordering medications, tests or procedures, and documenting clinical information in the EHR including the differential Dx, treatment, and any further evaluation and other management     Signed electronically by: Lyndle Herrlich, MD  [facility] Endocrinology  [facility] [address] [place] [place], [place] [address] Phone: [phone] FAX: [phone]   CC: Georgina Quint, MD [address] [place] [address] Phone: [phone]  Fax: [phone]  Return to Endocrinology clinic as below: Future Appointments  Date Time Provider Department Center  12/16/2023 10:00 AM [name] Corner, RN [facility] None  12/29/2023  2:00 PM Remigio Eisenmenger D [facility] None  01/20/2024 10:00 AM Georgina Quint, MD [facility] None  02/22/2024  1:20 PM Georgina Quint, MD [facility] None

## 2023-12-11 NOTE — Patient Outreach (Signed)
 Care Management  Transitions of Care Program Transitions of Care Post-discharge week 3/ day # 18   12/11/2023 Name: [name] MRN: [id number] DOB: 1940/11/07  Subjective: [name] is a 83 y.o. year old female who is a primary care patient of Sagardia, Eilleen Kempf, MD. The Care Management team Engaged with patient Engaged with patient by telephone to assess and address transitions of care needs.   Consent to Services:  Patient was given information about care management services, agreed to services, and gave verbal consent to participate.   Enrolled in TOC 30-day program:  11/05/23  Assessment: "I am doing fine, I guess, but some days I just feel so weak and tired.  I think the nurse will be coming back out to see me again next week, she came one day this week I think on Tuesday.  The urology doctor took the catheter out and I am peeing fine on my own.  We are going to the medicaid office now to see if I can get my medicaid reinstated, I don't know what happened, but I don't think I have it anymore.  I am going to get new scales this weekend."  Denies clinical concerns and sounds to be in no distress throughout [facility] 30-day program outreach call today          SDOH Interventions    Flowsheet Row Care Coordination from 12/09/2023 in [facility] Care Coordination Most recent reading at 12/09/2023  2:09 PM Telephone from 11/23/2023 in [place] [facility] DEPARTMENT Most recent reading at 11/23/2023 11:46 AM Clinical Support from 11/23/2023 in [facility] [place] Most recent reading at 11/23/2023 10:36 AM Telephone from 11/05/2023 in [place] POPULATION HEALTH DEPARTMENT Most recent reading at 11/05/2023 10:30 AM Clinical Support from 11/21/2022 in [facility] [place] Most recent reading at 11/21/2022 10:30 AM Chronic Care Management from 06/02/2022 in [facility] [place] Most recent  reading at 06/02/2022 11:25 AM  SDOH Interventions        Food Insecurity Interventions Community Resources Provided  [employer] Pantry list] Patient Declined, Other (Comment)  [already addressed earlier today- confirmed community resource care guide referral placed] -- Intervention Not Indicated Intervention Not Indicated Intervention Not Indicated  Housing Interventions Intervention Not Indicated Intervention Not Indicated Intervention Not Indicated Intervention Not Indicated Intervention Not Indicated  [continues to reside with spouse in single family home,  denies housing concerns] --  Transportation Interventions Intervention Not Indicated Intervention Not Indicated  [During TOC call today- patient reports husband to provide transportation moving forward] Intervention Not Indicated Intervention Not Indicated  [Reports friend "[name]" usually provides transportation,  husband also assists as needed/ indicated] Intervention Not Indicated  [patient reports drives self "sometimes, " also has transportation assistance as needed from spouse/ friends] Intervention Not Indicated  [patient reports drives self "sometimes, " also has transportation assistance as needed from spouse/ friends]  Utilities Interventions Intervention Not Indicated Intervention Not Indicated Intervention Not Indicated Intervention Not Indicated Intervention Not Indicated --  Alcohol Usage Interventions -- -- Intervention Not Indicated (Score <7) -- Intervention Not Indicated (Score <7) --  Depression Interventions/Treatment  -- -- NFA2-1 Score <4 Follow-up Not Indicated -- -- --  Financial Strain Interventions [employer] Provided  [name] is out of work and looking for another job.] -- -- -- Intervention Not Indicated --  Physical Activity Interventions -- -- Intervention Not Indicated -- Intervention Not Indicated, Patient Refused --  Stress Interventions -- --  Intervention Not Indicated -- Intervention Not Indicated --  Social  Connections Interventions -- -- Intervention Not Indicated -- Intervention Not Indicated --  Health Literacy Interventions -- -- Intervention Not Indicated -- -- --        Goals Addressed             This Visit's Progress    TOC 30-day Program Care Plan   On track    Current Barriers:  Medication management -- needs full medication review post-recent hospital discharge on 11/04/23 and 11/20/23: husband manages all medications- places in 7-day pill planner box; patient then takes independently; on initial TOC call for both discharges, patient and spouse/ caregiver verbalizes SIGNIFICANT confusion around medications 11/20/23: attempted to complete medication review: patient/ spouse became very frustrated and appear to have significant confusion around medications- post multiple recent hospitalizations; they decline further review today and state they will discuss with PCP during tomorrow's hospital follow up office visit Made PCP aware of patient's and spouse's confusion around medications 11/30/22: Full medication review completed post- PCP office visit on 11/24/23:  patient/ son [name] report much better understanding of medications during phone call today Multiple recent hospitalizations: at time of most recent hospital discharge on 11/20/23: hospice recommended- patient and spouse agreed: however, when I contacted [facility]- they report patient and spouse declined all services; husband and patient report today "they do not believe" that patient needs hospice care at this time 12/01/23: Patient/ son [name] report that "hospice have been circling like vultures;" apparently they have been contacted by Palliative Care but did not know difference between Palliative Care and Hospice-- education provided: provided contact information for Palliative care team ([facility]: [phone]) as per PCP referral on 11/24/23- encouraged them to call and schedule appointment for initiation of  services- they verbalize agreement/ understanding 12/11/23: Patient reports today she can not remember whether she and her son called [facility] as we discussed last week: re-provided contact information to her and encouraged her to call; again reinforced role pf palliative care services Care Coordination Outreach to [facility] Palliative Care-- Spoke with [name]- she confirmed that palliative care services have been active with patient since 11/27/23: Confirmed case manager as Willette Pa RN; direct number to Palliative care department is: [phone]; I provided my direct contact information to [facility] for ongoing care coordination/ collaboration; I also requested that Palliative Care team please reach out to patient next week to schedule next home visit for early March Home Health services through [facility] for PT/ OT/ RN/ SW [phone]- per outside hospital notes, "[name]")- previously ordered at time of hospital discharge on 11/04/23: not ordered post-hospital discharge on 11/20/23- as patient was set up with home hospice services- which she has since declined: made PCP aware of same, in cae he would like to re-order home health services 12/01/23: Confirmed patient is now active with [facility] home health agency, post- PCP office visit on 11/24/23: reports team has been making regular home visits: "its going well, they come out a couple of times each week" 12/11/23: Patient reports home health services remain active; reports last visit "on Tuesday of this week"-- she can not tell me which discipline visited her 11/05/23 [facility] call:  Equipment/DME confirmed patient obtained newly ordered NIV prior to hospital discharge and is using as instructed  Multiple recent inpatient hospitalizations and SNF rehabilitation visits:  Most recent discharge on 11/04/23-- admitted to hospital from SNF/ rehabilitation facility; 12-day inpatient hospitalization Cultural and language barriers:  [place] and  does not require translation services; however, she is limited in her processing/ understanding of information and requires ongoing and frequent reinforcement; additionally, her husband [name] Post is primary caregiver and has language barrier- does not speak English fluently- he also requires ongoing and frequent repetition/ reinforcement of all information and instructions 11/20/23: hospital re-admission within 30 days: 1/03-15, 2025 and 01/22-31, 2025: both for VHF/ chronic respiratory failure  RNCM Clinical Goal(s):  Patient will work with the Care Management team over the next 30 days to address Transition of Care Barriers: Medication Management Support at home Home Health services take all medications exactly as prescribed and will call provider for medication related questions as evidenced by review of medication with TOC 30-day program RN CM  attend all scheduled medical appointments: 11/24/23- PCP: [facility]U; 12/07/23- [facility] cardiology provider- per patient/ spouse report provider as evidenced by review of same with patient during TOC 30-day RN CM weekly outreaches not experience hospital admission as evidenced by review of EMR. Hospital Admissions in last 6 months = 4 inpatient admissions; 1 SNF-rehab admission  through collaboration with RN Care manager, provider, and care team.   Interventions: Evaluation of current treatment plan related to  self management and patient's adherence to plan as established by provider  Transitions of Care:  Goal on track:  Yes.  12/11/23 Doctor Visits  - discussed the importance of doctor visits Confirmed foley catheter was discontinued "this week" by urology provider; reports she is urinating without difficulty Reviewed recent office visit with cardiology ([facility] provider) on 12/07/23: reviewed basics of post-office visit instructions; patient confirms for me that she has decreased amiodarone dose to 200 mg every day, as instructed by cardiology provider: this was  updated on patient's medication list accordingly Patient declines full medication review today: reports she and her husband are on the way to the medicaid office to "see if she can get her medicaid benefit reinstated" Provided reinforcement and extensive education around difference between hospice- palliative care- home health services; care coordination outreach completed with [facility] Palliative Care team, as above: encouraged patient to fully engage with palliative care team Confirmed patient monitoring blood sugars at home using CGM:  she cannot provide recent blood sugar values at home:  she tells me that these values are uploaded into the "machine;" and "go straight to the doctor; reviewed with patient that she has scheduled offive visit TODAY with endocrinology provider--- she did not remember this appointment, but tells me she will attend as scheduled: reinforced and reiterated appointment time multiple times with patient and her husband, and encouraged them to please attend as scheduled Confirmed patient is NOT currently obtaining daily weights at home: provided education / rationale for daily weight monitoring at home as earliest indicator of fluid retention, along with weight gain guidelines/ action plan for weight gain; importance of taking diuretic as prescribed: patient tells me she has inconsistently been monitoring and recording daily weights at home due to "sometimes the scales work and sometimes they don't;" she proactively tells me that she is planning to obtain new scales "this weekend" and this was encouraged  Patient Goals/Self-Care Activities: goals per last outreach on 11/05/23: Participate in Transition of Care Program/Attend TOC scheduled calls Take all medications as prescribed Attend all scheduled provider appointments Call provider office for new concerns or questions  Continue pacing activity to avoid episodes of shortness of breath Use assistive devices as needed to  prevent falls- your walker and bedside commode Continue using the new non-invasive ventilator that you were given  at the hospital before your discharge home Eat a heart healthy and low salt diet Please check your blood sugars and daily weights at home every day, as we have discussed If you believe your condition is getting worse- contact your care providers (doctors) promptly- reaching out to your doctor early when you have concerns can prevent you from having to go to the hospital  Follow Up Plan:  Telephone follow up appointment with care management team member scheduled for:  Friday 12/11/23 at 10:00 am         Plan: Telephone follow up appointment with care management team member scheduled for:   Friday 12/11/23 at 10:00 am  Total time spent from review to signing of note/ including any care coordination interventions:  80 minutes  Caryl Pina, RN, BSN, Media planner  Transitions of Care  [facility] [phone]: direct office

## 2023-12-12 LAB — TSH: TSH: 2.08 m[IU]/L (ref 0.40–4.50)

## 2023-12-14 ENCOUNTER — Other Ambulatory Visit: Payer: Self-pay | Admitting: Emergency Medicine

## 2023-12-14 ENCOUNTER — Encounter: Payer: Self-pay | Admitting: Internal Medicine

## 2023-12-14 DIAGNOSIS — I480 Paroxysmal atrial fibrillation: Secondary | ICD-10-CM | POA: Diagnosis not present

## 2023-12-14 DIAGNOSIS — I5032 Chronic diastolic (congestive) heart failure: Secondary | ICD-10-CM | POA: Diagnosis not present

## 2023-12-14 DIAGNOSIS — I130 Hypertensive heart and chronic kidney disease with heart failure and stage 1 through stage 4 chronic kidney disease, or unspecified chronic kidney disease: Secondary | ICD-10-CM | POA: Diagnosis not present

## 2023-12-14 DIAGNOSIS — J984 Other disorders of lung: Secondary | ICD-10-CM | POA: Diagnosis not present

## 2023-12-14 DIAGNOSIS — M150 Primary generalized (osteo)arthritis: Secondary | ICD-10-CM | POA: Diagnosis not present

## 2023-12-14 DIAGNOSIS — E1165 Type 2 diabetes mellitus with hyperglycemia: Secondary | ICD-10-CM | POA: Diagnosis not present

## 2023-12-14 DIAGNOSIS — Z794 Long term (current) use of insulin: Secondary | ICD-10-CM | POA: Diagnosis not present

## 2023-12-14 DIAGNOSIS — E1122 Type 2 diabetes mellitus with diabetic chronic kidney disease: Secondary | ICD-10-CM | POA: Diagnosis not present

## 2023-12-14 DIAGNOSIS — N1832 Chronic kidney disease, stage 3b: Secondary | ICD-10-CM | POA: Diagnosis not present

## 2023-12-14 DIAGNOSIS — M109 Gout, unspecified: Secondary | ICD-10-CM

## 2023-12-14 NOTE — Telephone Encounter (Signed)
 Last Fill: Sertraline: 10/31/2023     Allopurinol: 11/16/23  Last OV: 11/24/23 Next OV: 01/20/24  Routing to provider for review/authorization.

## 2023-12-14 NOTE — Telephone Encounter (Signed)
 Copied from CRM [id number]. Topic: Clinical - Medication Refill >> Dec 14, 2023  1:55 PM [name] wrote: Most Recent Primary Care Visit:  Provider: Georgina Quint  Department: [facility]  Visit Type: HOSPITAL FOLLOW UP  Date: 11/24/2023  Medication: sertraline (ZOLOFT) 25 MG tablet/allopurinol (ZYLOPRIM) 300 MG tablet  Has the patient contacted their pharmacy? No (Agent: If no, request that the patient contact the pharmacy for the refill. If patient does not wish to contact the pharmacy document the reason why and proceed with request.) (Agent: If yes, when and what did the pharmacy advise?)  Is this the correct pharmacy for this prescription? Yes If no, delete pharmacy and type the correct one.  This is the patient's preferred pharmacy:  [id number] - [place], [place] - [address] AT [facility] OF [address] [place] [address] Phone: [phone] Fax: [phone]   Has the prescription been filled recently? Yes  Is the patient out of the medication? Yes  Has the patient been seen for an appointment in the last year OR does the patient have an upcoming appointment? Yes  Can we respond through MyChart? No  Agent: Please be advised that Rx refills may take up to 3 business days. We ask that you follow-up with your pharmacy.

## 2023-12-16 ENCOUNTER — Telehealth: Payer: Self-pay | Admitting: Emergency Medicine

## 2023-12-16 ENCOUNTER — Other Ambulatory Visit: Payer: Self-pay | Admitting: *Deleted

## 2023-12-16 DIAGNOSIS — I5032 Chronic diastolic (congestive) heart failure: Secondary | ICD-10-CM | POA: Diagnosis not present

## 2023-12-16 DIAGNOSIS — Z794 Long term (current) use of insulin: Secondary | ICD-10-CM | POA: Diagnosis not present

## 2023-12-16 DIAGNOSIS — E1122 Type 2 diabetes mellitus with diabetic chronic kidney disease: Secondary | ICD-10-CM | POA: Diagnosis not present

## 2023-12-16 DIAGNOSIS — M150 Primary generalized (osteo)arthritis: Secondary | ICD-10-CM | POA: Diagnosis not present

## 2023-12-16 DIAGNOSIS — E1165 Type 2 diabetes mellitus with hyperglycemia: Secondary | ICD-10-CM | POA: Diagnosis not present

## 2023-12-16 DIAGNOSIS — N1832 Chronic kidney disease, stage 3b: Secondary | ICD-10-CM | POA: Diagnosis not present

## 2023-12-16 DIAGNOSIS — J984 Other disorders of lung: Secondary | ICD-10-CM | POA: Diagnosis not present

## 2023-12-16 DIAGNOSIS — I480 Paroxysmal atrial fibrillation: Secondary | ICD-10-CM | POA: Diagnosis not present

## 2023-12-16 DIAGNOSIS — I130 Hypertensive heart and chronic kidney disease with heart failure and stage 1 through stage 4 chronic kidney disease, or unspecified chronic kidney disease: Secondary | ICD-10-CM | POA: Diagnosis not present

## 2023-12-16 MED ORDER — SERTRALINE HCL 25 MG PO TABS: 25.0000 mg | ORAL_TABLET | Freq: Every day | ORAL | 3 refills | Status: DC

## 2023-12-16 MED ORDER — ALLOPURINOL 300 MG PO TABS: 150.0000 mg | ORAL_TABLET | Freq: Every day | ORAL | 3 refills | Status: DC

## 2023-12-16 NOTE — Patient Outreach (Signed)
 Care Management  Transitions of Care Program Transitions of Care Post-discharge week 4/ day # 23   12/16/2023 Name: [name] MRN: [id number] DOB: 1941/01/23  Subjective: [name] is a 83 y.o. year old female who is a primary care patient of Sagardia, Eilleen Kempf, MD. The Care Management team Engaged with patient Engaged with patient by telephone to assess and address transitions of care needs.   Consent to Services:  Patient was given information about care management services, agreed to services, and gave verbal consent to participate.  Enrolled (second time): 11/23/23  Assessment: "I am doing so much better, I am even able to cook a little bit and to get out.  I am breathing fine and not having to use that ventilator thing they gave me in January, even though it is still here at the house if I need it.  I am fine and my husband is managing all of medications, he is at work today.  I am taking all of my medicine like they told me to, but I hold the insulin if my blood sugar is less than 120"  Denies clinical concerns and sounds to be in no distress throughout [facility] 30-day program outreach call today           SDOH Interventions    Flowsheet Row Care Coordination from 12/09/2023 in [facility] [employer] Care Coordination Most recent reading at 12/09/2023  2:09 PM Telephone from 11/23/2023 in [place] POPULATION HEALTH DEPARTMENT Most recent reading at 11/23/2023 11:46 AM Clinical Support from 11/23/2023 in [facility] at [place] Most recent reading at 11/23/2023 10:36 AM Telephone from 11/05/2023 in [place] POPULATION HEALTH DEPARTMENT Most recent reading at 11/05/2023 10:30 AM Clinical Support from 11/21/2022 in [facility] at [place] Most recent reading at 11/21/2022 10:30 AM Chronic Care Management from 06/02/2022 in [facility] at [place] Most recent reading at 06/02/2022 11:25 AM  SDOH Interventions         Food Insecurity Interventions Community Resources Provided  [employer] Pantry list] Patient Declined, Other (Comment)  [already addressed earlier today- confirmed community resource care guide referral placed] -- Intervention Not Indicated Intervention Not Indicated Intervention Not Indicated  Housing Interventions Intervention Not Indicated Intervention Not Indicated Intervention Not Indicated Intervention Not Indicated Intervention Not Indicated  [continues to reside with spouse in single family home,  denies housing concerns] --  Transportation Interventions Intervention Not Indicated Intervention Not Indicated  [During TOC call today- patient reports husband to provide transportation moving forward] Intervention Not Indicated Intervention Not Indicated  [Reports friend "[name]" usually provides transportation,  husband also assists as needed/ indicated] Intervention Not Indicated  [patient reports drives self "sometimes, " also has transportation assistance as needed from spouse/ friends] Intervention Not Indicated  [patient reports drives self "sometimes, " also has transportation assistance as needed from spouse/ friends]  Utilities Interventions Intervention Not Indicated Intervention Not Indicated Intervention Not Indicated Intervention Not Indicated Intervention Not Indicated --  Alcohol Usage Interventions -- -- Intervention Not Indicated (Score <7) -- Intervention Not Indicated (Score <7) --  Depression Interventions/Treatment  -- -- XBJ4-7 Score <4 Follow-up Not Indicated -- -- --  Financial Strain Interventions [employer] Provided  Gayla Doss is out of work and looking for another job.] -- -- -- Intervention Not Indicated --  Physical Activity Interventions -- -- Intervention Not Indicated -- Intervention Not Indicated, Patient Refused --  Stress Interventions -- -- Intervention Not Indicated -- Intervention Not Indicated --  Social Connections Interventions -- -- Intervention Not  Indicated -- Intervention Not Indicated --  Health Literacy Interventions -- -- Intervention Not Indicated -- -- --        Goals Addressed             This Visit's Progress    TOC 30-day Program Care Plan   On track    Current Barriers:  Medication management -- needs full medication review post-recent hospital discharge on 11/04/23 and 11/20/23: husband manages all medications- places in 7-day pill planner box; patient then takes independently; on initial TOC call for both discharges, patient and spouse/ caregiver verbalizes SIGNIFICANT confusion around medications 11/20/23: attempted to complete medication review: patient/ spouse became very frustrated and appear to have significant confusion around medications- post multiple recent hospitalizations; they decline further review today and state they will discuss with PCP during tomorrow's hospital follow up office visit Made PCP aware of patient's and spouse's confusion around medications 11/30/22: Full medication review completed post- PCP office visit on 11/24/23:  patient/ son Casimiro Needle report much better understanding of medications during phone call today Multiple recent hospitalizations: at time of most recent hospital discharge on 11/20/23: hospice recommended- patient and spouse agreed: however, when I contacted [facility]- they report patient and spouse declined all services; husband and patient report today "they do not believe" that patient needs hospice care at this time 12/01/23: Patient/ son Casimiro Needle report that "hospice have been circling like vultures;" apparently they have been contacted by Palliative Care but did not know difference between Palliative Care and Hospice-- education provided: provided contact information for Palliative care team ([facility]: [phone]) as per PCP referral on 11/24/23- encouraged them to call and schedule appointment for initiation of services- they verbalize agreement/  understanding 12/11/23: Patient reports today she can not remember whether she and her son called [facility] as we discussed last week: re-provided contact information to her and encouraged her to call; again reinforced role pf palliative care services 12/11/23:  Care Coordination Outreach to [facility] Palliative Care-- Spoke with Cordelia Pen- she confirmed that palliative care services have been active with patient since 11/27/23: Confirmed case manager as Willette Pa RN; direct number to Palliative care department is: [phone]; I provided my direct contact information to [facility] for ongoing care coordination/ collaboration; I also requested that Palliative Care team please reach out to patient next week to schedule next home visit for early March 12/16/23: Today, patient remembers nothing of our conversation last week re: Palliative care:  extensive re- education and reinforcement provided; encouraged patient to please contact Palliative Care Team, today, right after TOC call to schedule her next home visit- she tells me she has written the information down today and will call "as soon as we hang up;" this was encouraged Home Health services through [facility] for PT/ OT/ RN/ SW ([phone]- per outside hospital notes, "Inetta Fermo")- previously ordered at time of hospital discharge on 11/04/23: not ordered post-hospital discharge on 11/20/23- as patient was set up with home hospice services- which she has since declined: made PCP aware of same, in cae he would like to re-order home health services 12/01/23: Confirmed patient is now active with [facility] home [facility] agency, post- PCP office visit on 11/24/23: reports team has been making regular home visits: "its going well, they come out a couple of times each week" 12/11/23: Patient reports home health services remain active; reports last visit "on Tuesday of this week"-- she can not tell me which discipline visited her 12/16/23: Patient  tells me today that she  "thinks" [facility] home [facility] is still active- but also states "I am not sure;" she again reports that she had a visit "last week;" she declines need to contact home health team today, stating, "when they were here last time, they said I was doing great; if they want to come back, I guess they will call me, but I think I am doing good enough now, I don't think I really need them" 11/05/23 TOC call:  Equipment/DME confirmed patient obtained newly ordered NIV prior to hospital discharge and is using as instructed  Multiple recent inpatient hospitalizations and SNF rehabilitation visits:  Most recent discharge on 11/04/23-- admitted to hospital from SNF/ rehabilitation facility; 12-day inpatient hospitalization Cultural and language barriers:  [place] and does not require translation services; however, she is limited in her processing/ understanding of information and requires ongoing and frequent reinforcement; additionally, her husband Trinna Post is primary caregiver and has language barrier- does not speak English fluently- he also requires ongoing and frequent repetition/ reinforcement of all information and instructions 11/20/23: hospital re-admission within 30 days: 1/03-15, 2025 and 01/22-31, 2025: both for VHF/ chronic respiratory failure  RNCM Clinical Goal(s):  Patient will work with the Care Management team over the next 30 days to address Transition of Care Barriers: Medication Management Support at home Home Health services take all medications exactly as prescribed and will call provider for medication related questions as evidenced by review of medication with TOC 30-day program RN CM  attend all scheduled medical appointments: 11/24/23- PCP: [facility]; 12/07/23- [facility] cardiology provider- per patient/ spouse report provider as evidenced by review of same with patient during TOC 30-day RN CM weekly outreaches not experience hospital admission as evidenced by review of EMR. Hospital  Admissions in last 6 months = 4 inpatient admissions; 1 SNF-rehab admission  through collaboration with RN Care manager, provider, and care team.   Interventions: Evaluation of current treatment plan related to  self management and patient's adherence to plan as established by provider  Transitions of Care:  Goal on track:  Yes.  12/16/23 Doctor Visits  - discussed the importance of doctor visitsfoley catheter was discontinued "this week" by urology provider; reports she is urinating without difficulty Discussed current clinical condition: "I am doing so much better, I am even able to cook a little bit and to get out.  I am breathing fine and not having to use that ventilator thing they gave me in January, even though it is still here at the house if I need it.  I am fine and my husband is managing all of medications, he is at work today.  I am taking all of my medicine like they told me to, but I hold the insulin if my blood sugar is less than 120;" she denies specific clinical concerns today Reviewed recent office visit with endocrinology provider on 12/11/23: reviewed basics of post-office visit instructions; patient confirms for me that she understands new guidelines for insulin dosing: "20 units in the morning and 20 units at night;" however-- she reports that she has not been taking her insulin on a regular basis due to "blood sugars are all under 120;" she is worried about becoming hypoglycemic:  I encouraged her to please keep her endocrinology provider updated on her blood sugars and use or non-use of insulin at home Assessed patient's understanding of meaning/ significance of A1-C values: fair baseline understanding of same- Reviewed individual historical A1-C trends and provided  education around correlation of A1-C value to blood sugar levels at home over 3 months- her most recent A1-C value on 12/11/23 was 7.4 which is an improvement in her most recent historical trends Re- provided/ reinforced  with extensive re-education around difference between hospice- palliative care- home health services; explained to patient as above that she has been active with palliative care team since early February 2025:  re- provided contact information for palliative care team and encouraged patient to call/ schedule with them for March, TODAY and to fully engage with palliative care team Confirmed patient has started obtaining daily weights at home: re- provided education and reinforcement/ rationale for daily weight monitoring at home as earliest indicator of fluid retention, along with weight gain guidelines/ action plan for weight gain; importance of taking diuretic as prescribed: she reports weight ranges over last week as: "249-251 lbs," with a weight this morning of "250"-- positive reinforcement provided with encouragement to continue efforts at continuing daily weights at home Reinforced/ provided education around basics of following heart healthy low salt diet TOC 30-day program initial assessment completed today  Patient Goals/Self-Care Activities: goals per last outreach on 11/05/23: Participate in Transition of Care Program/Attend [facility] scheduled calls Take all medications as prescribed Attend all scheduled provider appointments Call provider office for new concerns or questions  Continue pacing activity to avoid episodes of shortness of breath Use assistive devices as needed to prevent falls- your walker and bedside commode Please call there Palliative Care Team to schedule an appointment for a home visit in March: [phone] Continue using the new non-invasive ventilator that you were given at the hospital before your hospital discharge home, if you need it Eat a heart healthy and low salt diet Please check your blood sugars and daily weights at home every day, as we have discussed If you believe your condition is getting worse- contact your care providers (doctors) promptly- reaching out to  your doctor early when you have concerns can prevent you from having to go to the hospital  Follow Up Plan:  Telephone follow up appointment with care management team member scheduled for:  Thursday 12/24/23 at 09:30 am         Plan: Telephone follow up appointment with care management team member scheduled for:   Thursday 12/24/23 at 09:30 am  Total time spent from review to signing of note/ including any care coordination interventions:  60 minutes; complex patient/ ongoing barriers as above/ requires extensive reinforcement/ repetition with each outreach  Caryl Pina, RN, BSN, CCRN Alumnus RN Care Manager  Transitions of Care  [facility] - [facility] [phone]: direct office

## 2023-12-16 NOTE — Telephone Encounter (Signed)
 Copied from CRM [id number]. Topic: General - Other >> Dec 16, 2023 11:44 AM Adele Barthel wrote: Reason for CRM:   Helmut Muster, an occupation therapist from [facility] is contacting office to report information from her visit with patient this morning.   Patient weighed 249.6 pounds(weight was taken mid day)  OT noted her current weight parameters are 239 to 249 pounds.  She has taken her diuretic and has no swelling present.   CB# [phone] [id number]

## 2023-12-16 NOTE — Patient Instructions (Signed)
 Visit Information  Thank you for taking time to visit with me today. Please don't hesitate to contact me if I can be of assistance to you before our next scheduled telephone appointment.  Our next appointment is by telephone on Thursday 12/24/23 at 09:30 am  Please call the care guide team at [phone] if you need to cancel or reschedule your appointment.   Following are the goals we discussed today:  Patient Goals/Self-Care Activities: goals per last outreach on 11/05/23: Participate in Transition of Care Program/Attend [facility] scheduled calls Take all medications as prescribed Attend all scheduled provider appointments Call provider office for new concerns or questions  Continue pacing activity to avoid episodes of shortness of breath Use assistive devices as needed to prevent falls- your walker and bedside commode Please call there Palliative Care Team to schedule an appointment for a home visit in March: [phone] Continue using the new non-invasive ventilator that you were given at the hospital before your hospital discharge home, if you need it Eat a heart healthy and low salt diet Please check your blood sugars and daily weights at home every day, as we have discussed If you believe your condition is getting worse- contact your care providers (doctors) promptly- reaching out to your doctor early when you have concerns can prevent you from having to go to the hospital  If you are experiencing a Mental Health or Behavioral Health Crisis or need someone to talk to, please  call the Suicide and Crisis Lifeline: 988 call the [place] National Suicide Prevention Lifeline: 762-553-9864 or TTY: 808-770-4215 TTY (434)512-1421) to talk to a trained counselor call 1-800-273-TALK (toll free, 24 hour hotline) go to [facility] [address], [place] [phone]) call the [facility]: [phone] call 911   The patient verbalized  understanding of instructions, educational materials, and care plan provided today and DECLINED offer to receive copy of patient instructions, educational materials, and care plan.   Pls call/ message for questions,  Caryl Pina, RN, BSN, CCRN Alumnus RN Care Manager  Transitions of Care  [facility]BCI - [facility] [phone]: direct office

## 2023-12-17 DIAGNOSIS — N1832 Chronic kidney disease, stage 3b: Secondary | ICD-10-CM | POA: Diagnosis not present

## 2023-12-17 DIAGNOSIS — I5032 Chronic diastolic (congestive) heart failure: Secondary | ICD-10-CM | POA: Diagnosis not present

## 2023-12-17 DIAGNOSIS — J984 Other disorders of lung: Secondary | ICD-10-CM | POA: Diagnosis not present

## 2023-12-17 DIAGNOSIS — I480 Paroxysmal atrial fibrillation: Secondary | ICD-10-CM | POA: Diagnosis not present

## 2023-12-17 DIAGNOSIS — M150 Primary generalized (osteo)arthritis: Secondary | ICD-10-CM | POA: Diagnosis not present

## 2023-12-17 DIAGNOSIS — E1122 Type 2 diabetes mellitus with diabetic chronic kidney disease: Secondary | ICD-10-CM | POA: Diagnosis not present

## 2023-12-17 DIAGNOSIS — I130 Hypertensive heart and chronic kidney disease with heart failure and stage 1 through stage 4 chronic kidney disease, or unspecified chronic kidney disease: Secondary | ICD-10-CM | POA: Diagnosis not present

## 2023-12-17 DIAGNOSIS — Z794 Long term (current) use of insulin: Secondary | ICD-10-CM | POA: Diagnosis not present

## 2023-12-17 DIAGNOSIS — E1165 Type 2 diabetes mellitus with hyperglycemia: Secondary | ICD-10-CM | POA: Diagnosis not present

## 2023-12-21 ENCOUNTER — Telehealth: Payer: Self-pay | Admitting: Emergency Medicine

## 2023-12-21 DIAGNOSIS — E1165 Type 2 diabetes mellitus with hyperglycemia: Secondary | ICD-10-CM | POA: Diagnosis not present

## 2023-12-21 DIAGNOSIS — I5032 Chronic diastolic (congestive) heart failure: Secondary | ICD-10-CM | POA: Diagnosis not present

## 2023-12-21 DIAGNOSIS — M150 Primary generalized (osteo)arthritis: Secondary | ICD-10-CM | POA: Diagnosis not present

## 2023-12-21 DIAGNOSIS — I480 Paroxysmal atrial fibrillation: Secondary | ICD-10-CM | POA: Diagnosis not present

## 2023-12-21 DIAGNOSIS — Z794 Long term (current) use of insulin: Secondary | ICD-10-CM | POA: Diagnosis not present

## 2023-12-21 DIAGNOSIS — J984 Other disorders of lung: Secondary | ICD-10-CM | POA: Diagnosis not present

## 2023-12-21 DIAGNOSIS — N1832 Chronic kidney disease, stage 3b: Secondary | ICD-10-CM | POA: Diagnosis not present

## 2023-12-21 DIAGNOSIS — I130 Hypertensive heart and chronic kidney disease with heart failure and stage 1 through stage 4 chronic kidney disease, or unspecified chronic kidney disease: Secondary | ICD-10-CM | POA: Diagnosis not present

## 2023-12-21 DIAGNOSIS — E1122 Type 2 diabetes mellitus with diabetic chronic kidney disease: Secondary | ICD-10-CM | POA: Diagnosis not present

## 2023-12-21 NOTE — Telephone Encounter (Signed)
 Tried [employer] Wrong call back number

## 2023-12-21 NOTE — Telephone Encounter (Signed)
 Copied from CRM [id number]. Topic: General - Other >> Dec 21, 2023 12:21 PM Suzette B wrote: Reason for CRM: Marchelle Folks called to report patient currently has piting edema with a weight increase to 253.6 please call Marchelle Folks at [phone].

## 2023-12-22 ENCOUNTER — Other Ambulatory Visit: Payer: Self-pay | Admitting: Emergency Medicine

## 2023-12-22 ENCOUNTER — Telehealth: Payer: Self-pay | Admitting: Emergency Medicine

## 2023-12-22 DIAGNOSIS — E1122 Type 2 diabetes mellitus with diabetic chronic kidney disease: Secondary | ICD-10-CM | POA: Diagnosis not present

## 2023-12-22 DIAGNOSIS — N1832 Chronic kidney disease, stage 3b: Secondary | ICD-10-CM | POA: Diagnosis not present

## 2023-12-22 DIAGNOSIS — Z794 Long term (current) use of insulin: Secondary | ICD-10-CM | POA: Diagnosis not present

## 2023-12-22 DIAGNOSIS — E1165 Type 2 diabetes mellitus with hyperglycemia: Secondary | ICD-10-CM | POA: Diagnosis not present

## 2023-12-22 DIAGNOSIS — I5032 Chronic diastolic (congestive) heart failure: Secondary | ICD-10-CM | POA: Diagnosis not present

## 2023-12-22 DIAGNOSIS — M150 Primary generalized (osteo)arthritis: Secondary | ICD-10-CM | POA: Diagnosis not present

## 2023-12-22 DIAGNOSIS — I480 Paroxysmal atrial fibrillation: Secondary | ICD-10-CM | POA: Diagnosis not present

## 2023-12-22 DIAGNOSIS — I130 Hypertensive heart and chronic kidney disease with heart failure and stage 1 through stage 4 chronic kidney disease, or unspecified chronic kidney disease: Secondary | ICD-10-CM | POA: Diagnosis not present

## 2023-12-22 DIAGNOSIS — J984 Other disorders of lung: Secondary | ICD-10-CM | POA: Diagnosis not present

## 2023-12-22 NOTE — Telephone Encounter (Unsigned)
 Copied from CRM [id number]. Topic: Clinical - Home Health Verbal Orders >> Dec 22, 2023 11:57 AM Isabell A wrote: Caller/Agency: Alisha from [facility]  Callback Number: [phone] Service Requested: Occupational Therapy & Physical Therapy  Frequency: N/A Any new concerns about the patient? Yes, states patient has been having irregular heart rates 102-148

## 2023-12-22 NOTE — Telephone Encounter (Unsigned)
 Copied from CRM [id number]. Topic: Clinical - Medication Refill >> Dec 22, 2023 11:50 AM Isabell A wrote: Most Recent Primary Care Visit:  Provider: Georgina Quint  Department: [facility]  Visit Type: HOSPITAL FOLLOW UP  Date: 11/24/2023  Medication: diltiazem (TIAZAC) 180 MG 24 hr capsule  Has the patient contacted their pharmacy? No (Agent: If no, request that the patient contact the pharmacy for the refill. If patient does not wish to contact the pharmacy document the reason why and proceed with request.) (Agent: If yes, when and what did the pharmacy advise?)  Is this the correct pharmacy for this prescription? Yes If no, delete pharmacy and type the correct one.  This is the patient's preferred pharmacy:  [id number] - [place], [place] - [address] AT [facility] [address] [place] [address] Phone: [phone] Fax: [phone]   Was the prescription been filled recently? Yes  Is the patient out of the medication? Yes  Has the patient been seen for an appointment in the last year OR does the patient have an upcoming appointment? Yes  Can we respond through MyChart? No  Agent: Please be advised that Rx refills may take up to 3 business days. We ask that you follow-up with your pharmacy.

## 2023-12-22 NOTE — Telephone Encounter (Signed)
 Copied from CRM [id number]. Topic: Clinical - Prescription Issue >> Dec 22, 2023 11:55 AM [name] wrote: Reason for CRM: Patient is requesting for the following medications to go to [facility] on file, advised it was sent on 2/26 through [facility] mail order - she does not want any of her prescriptions through mail order anymore.   sertraline (ZOLOFT) 25 MG tablet allopurinol (ZYLOPRIM) 300 MG tablet

## 2023-12-22 NOTE — Telephone Encounter (Signed)
 Last Fill: Unknown  Last OV: 09/01/23 Next OV: 01/20/23  Routing to provider for review/authorization.

## 2023-12-24 ENCOUNTER — Other Ambulatory Visit: Payer: Self-pay | Admitting: Radiology

## 2023-12-24 ENCOUNTER — Ambulatory Visit: Payer: Self-pay | Admitting: Emergency Medicine

## 2023-12-24 ENCOUNTER — Other Ambulatory Visit: Payer: Self-pay | Admitting: *Deleted

## 2023-12-24 DIAGNOSIS — I480 Paroxysmal atrial fibrillation: Secondary | ICD-10-CM | POA: Diagnosis not present

## 2023-12-24 DIAGNOSIS — I5032 Chronic diastolic (congestive) heart failure: Secondary | ICD-10-CM

## 2023-12-24 DIAGNOSIS — Z794 Long term (current) use of insulin: Secondary | ICD-10-CM | POA: Diagnosis not present

## 2023-12-24 DIAGNOSIS — N1832 Chronic kidney disease, stage 3b: Secondary | ICD-10-CM | POA: Diagnosis not present

## 2023-12-24 DIAGNOSIS — J984 Other disorders of lung: Secondary | ICD-10-CM | POA: Diagnosis not present

## 2023-12-24 DIAGNOSIS — E1165 Type 2 diabetes mellitus with hyperglycemia: Secondary | ICD-10-CM | POA: Diagnosis not present

## 2023-12-24 DIAGNOSIS — M150 Primary generalized (osteo)arthritis: Secondary | ICD-10-CM | POA: Diagnosis not present

## 2023-12-24 DIAGNOSIS — I130 Hypertensive heart and chronic kidney disease with heart failure and stage 1 through stage 4 chronic kidney disease, or unspecified chronic kidney disease: Secondary | ICD-10-CM | POA: Diagnosis not present

## 2023-12-24 DIAGNOSIS — E1122 Type 2 diabetes mellitus with diabetic chronic kidney disease: Secondary | ICD-10-CM | POA: Diagnosis not present

## 2023-12-24 DIAGNOSIS — M109 Gout, unspecified: Secondary | ICD-10-CM

## 2023-12-24 MED ORDER — SERTRALINE HCL 25 MG PO TABS: 25.0000 mg | ORAL_TABLET | Freq: Every day | ORAL | 3 refills | Status: DC

## 2023-12-24 MED ORDER — ALLOPURINOL 300 MG PO TABS
150.0000 mg | ORAL_TABLET | Freq: Every day | ORAL | 3 refills | Status: DC
Start: 2023-12-24 — End: 2024-03-22

## 2023-12-24 NOTE — Patient Outreach (Signed)
 Care Management  Transitions of Care Program Transitions of Care Post-discharge week # 5/ day # 31   12/24/2023 Name: [name] MRN: [id number] DOB: August 20, 1941  Subjective: [name] is a 83 y.o. year old female who is a primary care patient of Sagardia, Eilleen Kempf, MD. The Care Management team Engaged with patient Engaged with patient by telephone to assess and address transitions of care needs.   Consent to Services:  Patient was given information about care management services, agreed to services, and gave verbal consent to participate.  Re-enrolled in TOC 30-day program:  11/23/23; case closure: 12/24/23  TOC 30--day outreach completed; patient has successfully met/ accomplished her established goals for TOC 30-day program without hospital readmission and referral for ongoing follow up with longitudinal RN CM was placed for prompt follow up   Assessment: "I am still doing so much better.  I think the palliative care people came out last week; the [facility] home health people are still coming too.  I can't keep up with everything, even though I have this notebook I am writing everything down in--- I can't find the notebook right now.  I think my weight this morning was 262 lbs, but I am not sure.  I am not short of breath, and my leg swelling is the same as it always is.  I am still not taking the insulin regularly, because I am so afraid that I will fall too low.  My blood sugars are fine without the insulin and Dr. Lonzo Cloud knows I am not taking it regularly.  I think my blood sugar was 160 this morning"  Denies clinical concerns and sounds to be in no distress throughout [facility] 30-day program outreach call today: please see care plan notes below dated 12/24/23 for additional information/ interventions          SDOH Interventions    Flowsheet Row Care Coordination from 12/09/2023 in [employer] Care Coordination Most recent reading at 12/09/2023  2:09 PM  Telephone from 11/23/2023 in [place] POPULATION HEALTH DEPARTMENT Most recent reading at 11/23/2023 11:46 AM Clinical Support from 11/23/2023 in [facility] at [place] Most recent reading at 11/23/2023 10:36 AM Telephone from 11/05/2023 in [place] POPULATION HEALTH DEPARTMENT Most recent reading at 11/05/2023 10:30 AM Clinical Support from 11/21/2022 in [facility] at [place] Most recent reading at 11/21/2022 10:30 AM Chronic Care Management from 06/02/2022 in [facility] at [place] Most recent reading at 06/02/2022 11:25 AM  SDOH Interventions        Food Insecurity Interventions Community Resources Provided  [employer] Pantry list] Patient Declined, Other (Comment)  [already addressed earlier today- confirmed community resource care guide referral placed] -- Intervention Not Indicated Intervention Not Indicated Intervention Not Indicated  Housing Interventions Intervention Not Indicated Intervention Not Indicated Intervention Not Indicated Intervention Not Indicated Intervention Not Indicated  [continues to reside with spouse in single family home,  denies housing concerns] --  Transportation Interventions Intervention Not Indicated Intervention Not Indicated  [During TOC call today- patient reports husband to provide transportation moving forward] Intervention Not Indicated Intervention Not Indicated  [Reports friend "Venita Sheffield" usually provides transportation,  husband also assists as needed/ indicated] Intervention Not Indicated  [patient reports drives self "sometimes, " also has transportation assistance as needed from spouse/ friends] Intervention Not Indicated  [patient reports drives self "sometimes, " also has transportation assistance as needed from spouse/ friends]  Utilities Interventions Intervention Not Indicated Intervention Not  Indicated Intervention Not Indicated Intervention Not Indicated Intervention Not Indicated --  Alcohol  Usage Interventions -- -- Intervention Not Indicated (Score <7) -- Intervention Not Indicated (Score <7) --  Depression Interventions/Treatment  -- -- NWG9-5 Score <4 Follow-up Not Indicated -- -- --  Financial Strain Interventions Community Resources Provided  Gayla Doss is out of work and looking for another job.] -- -- -- Intervention Not Indicated --  Physical Activity Interventions -- -- Intervention Not Indicated -- Intervention Not Indicated, Patient Refused --  Stress Interventions -- -- Intervention Not Indicated -- Intervention Not Indicated --  Social Connections Interventions -- -- Intervention Not Indicated -- Intervention Not Indicated --  Health Literacy Interventions -- -- Intervention Not Indicated -- -- --        Goals Addressed             This Visit's Progress    TOC 30-day Program Care Plan   On track    Current Barriers:  Medication management -- needs full medication review post-recent hospital discharge on 11/04/23 and 11/20/23: husband manages all medications- places in 7-day pill planner box; patient then takes independently; on initial TOC call for both discharges, patient and spouse/ caregiver verbalizes SIGNIFICANT confusion around medications 11/20/23: attempted to complete medication review: patient/ spouse became very frustrated and appear to have significant confusion around medications- post multiple recent hospitalizations; they decline further review today and state they will discuss with PCP during tomorrow's hospital follow up office visit Made PCP aware of patient's and spouse's confusion around medications 11/30/22: Full medication review completed post- PCP office visit on 11/24/23:  patient/ son Casimiro Needle report much better understanding of medications during phone call today Multiple recent hospitalizations: at time of most recent hospital discharge on 11/20/23: hospice recommended- patient and spouse agreed: however, when I contacted [facility]- they report patient and spouse declined all services; husband and patient report today "they do not believe" that patient needs hospice care at this time 12/01/23: Patient/ son Casimiro Needle report that "hospice have been circling like vultures;" apparently they have been contacted by Palliative Care but did not know difference between Palliative Care and Hospice-- education provided: provided contact information for Palliative care team ([facility]: [phone]) as per PCP referral on 11/24/23- encouraged them to call and schedule appointment for initiation of services- they verbalize agreement/ understanding 12/11/23: Patient reports today she can not remember whether she and her son called [facility] as we discussed last week: re-provided contact information to her and encouraged her to call; again reinforced role pf palliative care services 12/11/23:  Care Coordination Outreach to [facility] Palliative [facility]-- Spoke with Cordelia Pen- she confirmed that palliative care services have been active with patient since 11/27/23: Confirmed case manager as Willette Pa RN; direct number to Palliative care department is: [phone]; I provided my direct contact information to [facility] for ongoing care coordination/ collaboration; I also requested that Palliative Care team please reach out to patient next week to schedule next home visit for early March 12/16/23: Today, patient remembers nothing of our conversation last week re: Palliative care:  extensive re- education and reinforcement provided; encouraged patient to please contact Palliative Care Team, today, right after TOC call to schedule her next home visit- she tells me she has written the information down today and will call "as soon as we hang up;" this was encouraged Home Health services through [facility] for PT/ OT/ RN/ SW [phone]- per outside hospital notes, "Inetta Fermo")- previously ordered at time of hospital discharge on 11/04/23:  not ordered  post-hospital discharge on 11/20/23- as patient was set up with home hospice services- which she has since declined: made PCP aware of same, in cae he would like to re-order home health services 12/01/23: Confirmed patient is now active with [facility] home [facility] agency, post- PCP office visit on 11/24/23: reports team has been making regular home visits: "its going well, they come out a couple of times each week" 12/11/23: Patient reports home health services remain active; reports last visit "on Tuesday of this week"-- she can not tell me which discipline visited her 12/16/23: Patient tells me today that she "thinks" [facility] home health is still active- but also states "I am not sure;" she again reports that she had a visit "last week;" she declines need to contact home health team today, stating, "when they were here last time, they said I was doing great; if they want to come back, I guess they will call me, but I think I am doing good enough now, I don't think I really need them" 11/05/23 TOC call:  Equipment/DME confirmed patient obtained newly ordered NIV prior to hospital discharge and is using as instructed  Multiple recent inpatient hospitalizations and SNF rehabilitation visits:  Most recent discharge on 11/04/23-- admitted to hospital from SNF/ rehabilitation facility; 12-day inpatient hospitalization Cultural and language barriers:  [place] and does not require translation services; however, she is limited in her processing/ understanding of information and requires ongoing and frequent reinforcement; additionally, her husband Trinna Post is primary caregiver and has language barrier- does not speak English fluently- he also requires ongoing and frequent repetition/ reinforcement of all information and instructions 11/20/23: hospital re-admission within 30 days: 1/03-15, 2025 and 01/22-31, 2025: both for VHF/ chronic respiratory failure  RNCM Clinical Goal(s):  Patient will work  with the Care Management team over the next 30 days to address Transition of Care Barriers: Medication Management Support at home Home Health services take all medications exactly as prescribed and will call provider for medication related questions as evidenced by review of medication with TOC 30-day program RN CM  attend all scheduled medical appointments: 11/24/23- PCP: HFU; 12/07/23- [facility] cardiology provider- per patient/ spouse report provider as evidenced by review of same with patient during TOC 30-day RN CM weekly outreaches not experience hospital admission as evidenced by review of EMR. Hospital Admissions in last 6 months = 4 inpatient admissions; 1 SNF-rehab admission  through collaboration with RN Care manager, provider, and care team.   Interventions: Evaluation of current treatment plan related to  self management and patient's adherence to plan as established by provider  Transitions of Care:  Goal Met.  12/24/23 Doctor Visits  - discussed the importance of doctor visitsf Discussed current clinical condition: "I am still doing so much better.  I think the palliative care people came out last week; the [facility] home health people are still coming too.  I can't keep up with everything, even though I have this notebook I am writing everything down in--- I can't find the notebook right now.  I think my weight this morning was 262 lbs, but I am not sure.  I am not short of breath, and my leg swelling is the same as it always is.  I am still not taking the insulin regularly, because I am so afraid that I will fall too low.  My blood sugars are fine without the insulin and Dr. Lonzo Cloud knows I am not taking it regularly.  I  think my blood sugar was 160 this morning" she again denies specific clinical concerns today- but reported weight is significantly increased from last outreach reported weight of 250 re- provided education and reinforcement/ rationale for daily weight monitoring at home  as earliest indicator of fluid retention, along with weight gain guidelines/ action plan for weight gain; importance of taking diuretic as prescribed: advised patient to monitor/ record daily weights at home and to contact cardiology provider for weight gain as per weight gain guidelines: she verbalizes understanding, as she always does--- but unfortunately- patient does not appear to follow action plan for weight gain guidelines- she was again encouraged to do so She again reports that she has not been taking her insulin on a regular basis due to "blood sugars fall low when I take it;" I again encouraged her to please keep her endocrinology provider updated on her blood sugars and use or non-use of insulin at home Re- provided/ reinforced with extensive re-education around difference between hospice- palliative care- home health services; again explained to patient as above that she has been active with palliative care team since early February 2025:  she reports has contact information for palliative care team and I encouraged patient to maintain full engagement with palliative care team Reinforced/ provided education around basics of following heart healthy low salt diet Confirmed no recent medication changes- she again reports adherence to medication regimen, "oh yes, I take all of my medicines just like I am supposed to" TOC 30--day outreach completed; patient has successfully met/ accomplished her established goals for [facility] 30-day program without hospital readmission and referral for ongoing follow up with longitudinal RN CM was placed for prompt follow up  I had a pointed conversation with patient, encouraging her to make every effort to adhere to her established plan of care for CHF/ AF/ DM and benefits of taking consistent control of her daily care at home: she states she "is doing this," but her weekly self- reports indicate otherwise; persistent barriers as documented above, for the most part-  persist  Patient Goals/Self-Care Activities: goals per last outreach on 11/05/23: Take all medications as prescribed Attend all scheduled provider appointments Call provider office for new concerns or questions  Continue pacing activity to avoid episodes of shortness of breath Use assistive devices as needed to prevent falls- your walker and bedside commode Please maintain your services with Palliative Care Team- their phone number is: [phone] Continue using the new non-invasive ventilator that you were given at the hospital before your hospital discharge home, if you need it Eat a heart healthy and low salt diet Please check your blood sugars and daily weights at home every day, as we have discussed If you believe your condition is getting worse- contact your care providers (doctors) promptly- reaching out to your doctor early when you have concerns can prevent you from having to go to the hospital  Follow Up Plan:      TOC 30--day outreach completed; patient has successfully met/ accomplished her established goals for [facility] 30-day program without hospital readmission and referral for ongoing follow up with longitudinal RN CM was placed for prompt follow up        Plan:    TOC 30--day outreach completed; patient has successfully met/ accomplished her established goals for [facility] 30-day program without hospital readmission and referral for ongoing follow up with longitudinal RN CM was placed for prompt follow up   Total time spent from review to signing of note/ including any  care coordination interventions:  44 minutes  Pls call/ message for questions,  Caryl Pina, RN, BSN, CCRN Alumnus RN Care Manager  Transitions of Care  [facility] - [facility] [phone]: direct office

## 2023-12-24 NOTE — Telephone Encounter (Signed)
 Information obtained from PTA, Marchelle Folks.   Pt found with HR 115-135, hx of afib   Chief Complaint: HR 115-135 Symptoms: denies Frequency: unknown, PTA came for PT and found pt tachy Pertinent Negatives: Patient denies SOB, CP, difficulty breathing, dizziness, lightheadedness, palpitations,  Disposition: [x] ED /[] Urgent Care (no appt availability in office) / [] Appointment(In office/virtual)/ []  [place] [facility]/ [] Home Care/ [] Refused Recommended Disposition /[] [place] [facility]/ []  Follow-up with PCP Additional Notes: PTA came to pts house for a visit, during VS prior to PT pt was found to have a HR that has been 115-135, pt BP also elevated compared to her normal per PTA. Pt denies any/all s/s. Pt with hx of afib.Pt advised ED, pt attempting to find ride. Advised 911 if unable to locate a ride. Pt and PTA advised to call back if they have any questions/concerns.    Reason for Disposition  Age > 60 years  (Exception: Brief heartbeat symptoms that went away and now feels well.)  Answer Assessment - Initial Assessment Questions 1. DESCRIPTION: "Please describe your heart rate or heartbeat that you are having" (e.g., fast/slow, regular/irregular, skipped or extra beats, "palpitations")     Fast,  2. ONSET: "When did it start?" (Minutes, hours or days)      Found by PTA on arrival 3. DURATION: "How long does it last" (e.g., seconds, minutes, hours)     Pt has c/o 4. PATTERN "Does it come and go, or has it been constant since it started?"  "Does it get worse with exertion?"   "Are you feeling it now?"     Denies s/s 6. HEART RATE: "Can you tell me your heart rate?" "How many beats in 15 seconds?"  (Note: not all patients can do this)       115-135 7. RECURRENT SYMPTOM: "Have you ever had this before?" If Yes, ask: "When was the last time?" and "What happened that time?"      Yes, hx of afib 8. CAUSE: "What do you think is causing the palpitations?"     unsure 9. CARDIAC HISTORY:  "Do you have any history of heart disease?" (e.g., heart attack, angina, bypass surgery, angioplasty, arrhythmia)      Afib  10. OTHER SYMPTOMS: "Do you have any other symptoms?" (e.g., dizziness, chest pain, sweating, difficulty breathing)       denies  Protocols used: Heart Rate and Heartbeat Questions-A-AH

## 2023-12-24 NOTE — Telephone Encounter (Signed)
 LVM for [name] to call back for Verbal orders

## 2023-12-24 NOTE — Telephone Encounter (Signed)
 Thank you :)

## 2023-12-24 NOTE — Telephone Encounter (Signed)
Medications sent to [facility] ° °

## 2023-12-24 NOTE — Telephone Encounter (Signed)
 Okay for verbal orders as requested.  Thanks.

## 2023-12-25 ENCOUNTER — Telehealth: Payer: Self-pay | Admitting: *Deleted

## 2023-12-25 ENCOUNTER — Telehealth: Payer: Self-pay

## 2023-12-25 DIAGNOSIS — I5033 Acute on chronic diastolic (congestive) heart failure: Secondary | ICD-10-CM | POA: Diagnosis not present

## 2023-12-25 DIAGNOSIS — J9622 Acute and chronic respiratory failure with hypercapnia: Secondary | ICD-10-CM | POA: Diagnosis not present

## 2023-12-25 DIAGNOSIS — N179 Acute kidney failure, unspecified: Secondary | ICD-10-CM | POA: Diagnosis not present

## 2023-12-25 DIAGNOSIS — E039 Hypothyroidism, unspecified: Secondary | ICD-10-CM | POA: Diagnosis not present

## 2023-12-25 DIAGNOSIS — M150 Primary generalized (osteo)arthritis: Secondary | ICD-10-CM | POA: Diagnosis not present

## 2023-12-25 DIAGNOSIS — I5032 Chronic diastolic (congestive) heart failure: Secondary | ICD-10-CM | POA: Diagnosis not present

## 2023-12-25 DIAGNOSIS — Z7189 Other specified counseling: Secondary | ICD-10-CM | POA: Diagnosis not present

## 2023-12-25 DIAGNOSIS — E871 Hypo-osmolality and hyponatremia: Secondary | ICD-10-CM | POA: Diagnosis not present

## 2023-12-25 DIAGNOSIS — I130 Hypertensive heart and chronic kidney disease with heart failure and stage 1 through stage 4 chronic kidney disease, or unspecified chronic kidney disease: Secondary | ICD-10-CM | POA: Diagnosis not present

## 2023-12-25 DIAGNOSIS — I4891 Unspecified atrial fibrillation: Secondary | ICD-10-CM | POA: Diagnosis not present

## 2023-12-25 DIAGNOSIS — Z515 Encounter for palliative care: Secondary | ICD-10-CM | POA: Diagnosis not present

## 2023-12-25 DIAGNOSIS — G4733 Obstructive sleep apnea (adult) (pediatric): Secondary | ICD-10-CM | POA: Diagnosis not present

## 2023-12-25 DIAGNOSIS — Z794 Long term (current) use of insulin: Secondary | ICD-10-CM | POA: Diagnosis not present

## 2023-12-25 DIAGNOSIS — E785 Hyperlipidemia, unspecified: Secondary | ICD-10-CM | POA: Diagnosis not present

## 2023-12-25 DIAGNOSIS — Z66 Do not resuscitate: Secondary | ICD-10-CM | POA: Diagnosis not present

## 2023-12-25 DIAGNOSIS — N1832 Chronic kidney disease, stage 3b: Secondary | ICD-10-CM | POA: Diagnosis not present

## 2023-12-25 DIAGNOSIS — R918 Other nonspecific abnormal finding of lung field: Secondary | ICD-10-CM | POA: Diagnosis not present

## 2023-12-25 DIAGNOSIS — J9621 Acute and chronic respiratory failure with hypoxia: Secondary | ICD-10-CM | POA: Diagnosis not present

## 2023-12-25 DIAGNOSIS — R1900 Intra-abdominal and pelvic swelling, mass and lump, unspecified site: Secondary | ICD-10-CM | POA: Diagnosis not present

## 2023-12-25 DIAGNOSIS — J9620 Acute and chronic respiratory failure, unspecified whether with hypoxia or hypercapnia: Secondary | ICD-10-CM | POA: Diagnosis not present

## 2023-12-25 DIAGNOSIS — E1165 Type 2 diabetes mellitus with hyperglycemia: Secondary | ICD-10-CM | POA: Diagnosis not present

## 2023-12-25 DIAGNOSIS — E1122 Type 2 diabetes mellitus with diabetic chronic kidney disease: Secondary | ICD-10-CM | POA: Diagnosis not present

## 2023-12-25 DIAGNOSIS — J810 Acute pulmonary edema: Secondary | ICD-10-CM | POA: Diagnosis not present

## 2023-12-25 DIAGNOSIS — Z6841 Body Mass Index (BMI) 40.0 and over, adult: Secondary | ICD-10-CM | POA: Diagnosis not present

## 2023-12-25 DIAGNOSIS — J90 Pleural effusion, not elsewhere classified: Secondary | ICD-10-CM | POA: Diagnosis not present

## 2023-12-25 DIAGNOSIS — R0602 Shortness of breath: Secondary | ICD-10-CM | POA: Diagnosis not present

## 2023-12-25 DIAGNOSIS — I517 Cardiomegaly: Secondary | ICD-10-CM | POA: Diagnosis not present

## 2023-12-25 DIAGNOSIS — I10 Essential (primary) hypertension: Secondary | ICD-10-CM | POA: Diagnosis not present

## 2023-12-25 DIAGNOSIS — I480 Paroxysmal atrial fibrillation: Secondary | ICD-10-CM | POA: Diagnosis not present

## 2023-12-25 DIAGNOSIS — R5381 Other malaise: Secondary | ICD-10-CM | POA: Diagnosis not present

## 2023-12-25 DIAGNOSIS — J9601 Acute respiratory failure with hypoxia: Secondary | ICD-10-CM | POA: Diagnosis not present

## 2023-12-25 DIAGNOSIS — J984 Other disorders of lung: Secondary | ICD-10-CM | POA: Diagnosis not present

## 2023-12-25 NOTE — Progress Notes (Signed)
 Complex Care Management Note  Care Guide Note 12/25/2023 Name: [name] MRN: [id number] DOB: 11-Dec-1940  [name] is a 83 y.o. year old female who sees Sagardia, Eilleen Kempf, MD for primary care. I reached out to [name] by phone today to offer complex care management services.  [name] was given information about Complex Care Management services today including:   The Complex Care Management services include support from the care team which includes your Nurse Care Manager, Clinical Social Worker, or Pharmacist.  The Complex Care Management team is here to help remove barriers to the health concerns and goals most important to you. Complex Care Management services are voluntary, and the patient may decline or stop services at any time by request to their care team member.   Complex Care Management Consent Status: Patient agreed to services and verbal consent obtained.   Follow up plan:  Telephone appointment with complex care management team member scheduled for:  01/06/24  Encounter Outcome:  Patient Scheduled  Gwenevere Ghazi  [facility]  [facility], [facility] Guide  Direct Dial: [phone]  Fax [phone]

## 2023-12-25 NOTE — Telephone Encounter (Signed)
 Copied from CRM [id number]. Topic: General - Other >> Dec 25, 2023  1:48 PM Jon Gills C wrote: Reason for CRM: Patient Nurse called in wanted to let Dr.Sagardia know that patient blood pressure and heart rate is highly elevated today and she is going to the Emergency Room now   Blood pressure is 156/110 Heart Rate is 120s to 130s

## 2023-12-26 DIAGNOSIS — I4891 Unspecified atrial fibrillation: Secondary | ICD-10-CM | POA: Diagnosis not present

## 2023-12-27 DIAGNOSIS — I4891 Unspecified atrial fibrillation: Secondary | ICD-10-CM | POA: Diagnosis not present

## 2023-12-28 DIAGNOSIS — N1832 Chronic kidney disease, stage 3b: Secondary | ICD-10-CM | POA: Diagnosis not present

## 2023-12-28 DIAGNOSIS — I10 Essential (primary) hypertension: Secondary | ICD-10-CM | POA: Diagnosis not present

## 2023-12-28 DIAGNOSIS — E039 Hypothyroidism, unspecified: Secondary | ICD-10-CM | POA: Diagnosis not present

## 2023-12-28 DIAGNOSIS — I4891 Unspecified atrial fibrillation: Secondary | ICD-10-CM | POA: Diagnosis not present

## 2023-12-28 DIAGNOSIS — E785 Hyperlipidemia, unspecified: Secondary | ICD-10-CM | POA: Diagnosis not present

## 2023-12-28 DIAGNOSIS — E1165 Type 2 diabetes mellitus with hyperglycemia: Secondary | ICD-10-CM | POA: Diagnosis not present

## 2023-12-28 DIAGNOSIS — Z794 Long term (current) use of insulin: Secondary | ICD-10-CM | POA: Diagnosis not present

## 2023-12-28 DIAGNOSIS — R1900 Intra-abdominal and pelvic swelling, mass and lump, unspecified site: Secondary | ICD-10-CM | POA: Diagnosis not present

## 2023-12-29 ENCOUNTER — Ambulatory Visit: Payer: Self-pay | Admitting: Licensed Clinical Social Worker

## 2023-12-29 DIAGNOSIS — I4891 Unspecified atrial fibrillation: Secondary | ICD-10-CM | POA: Diagnosis not present

## 2023-12-29 MED ORDER — DILTIAZEM HCL ER COATED BEADS 180 MG PO CP24: 180.0000 mg | ORAL_CAPSULE | Freq: Every day | ORAL | 3 refills | Status: AC

## 2023-12-29 NOTE — Patient Instructions (Signed)
 Visit Information  Thank you for taking time to visit with me today. Please don't hesitate to contact me if I can be of assistance to you.   Following are the goals we discussed today:   Goals Addressed             This Visit's Progress    Care Coordination Activities       Care Coordination Interventions:and  Patient stated that her husband is out of work and it has made things hard in a financial way and it has been has caused strain on them buying food and bills. At this time the bills are ok but food has been an issue form time to time. SW discussed the food pantry list and the patient wants a list mailed to to her. The SW will mail out the list. The husband is currently looking for a job and has some applications in at places. Patient is in the hospital and wants the SW to re mail the  food pantry resources  SW will follow up on 01/14/2024 at 1:30 pm        Our next appointment is by telephone on 01/14/2024 at 1:30 pm  Please call the care guide team at [phone] if you need to cancel or reschedule your appointment.   If you are experiencing a Mental Health or Behavioral Health Crisis or need someone to talk to, please call the Suicide and Crisis Lifeline: 988 go to [facility] [address], [place] [phone]) call 911  The patient verbalized understanding of instructions, educational materials, and care plan provided today and DECLINED offer to receive copy of patient instructions, educational materials, and care plan.   Jeanie Cooks, PhD [facility], [facility] Social Worker Direct Dial: [phone]  Fax: [phone]

## 2023-12-29 NOTE — Patient Outreach (Signed)
 Care Coordination   Follow Up Visit Note   12/29/2023 Name: [name] MRN: [id number] DOB: Oct 08, 1941  [name] is a 83 y.o. year old female who sees Sagardia, Eilleen Kempf, MD for primary care. I spoke with  [name] by phone today.  What matters to the patients health and wellness today?  Patient is in the hospital today but wanted the SW to re mail the food pantry list    Goals Addressed             This Visit's Progress    Care Coordination Activities       Care Coordination Interventions:and  Patient stated that her husband is out of work and it has made things hard in a financial way and it has been has caused strain on them buying food and bills. At this time the bills are ok but food has been an issue form time to time. SW discussed the food pantry list and the patient wants a list mailed to to her. The SW will mail out the list. The husband is currently looking for a job and has some applications in at places. Patient is in the hospital and wants the SW to re mail the  food pantry resources  SW will follow up on 01/14/2024 at 1:30 pm        SDOH assessments and interventions completed:  Yes  SDOH Interventions Today    Flowsheet Row Most Recent Value  SDOH Interventions   Food Insecurity Interventions Community Resources Provided  [SW will mail food pantry list]  Housing Interventions Intervention Not Indicated  Transportation Interventions Intervention Not Indicated  Utilities Interventions Intervention Not Indicated        Care Coordination Interventions:  Yes, provided  Interventions Today    Flowsheet Row Most Recent Value  General Interventions   General Interventions Discussed/Reviewed General Interventions Reviewed, [employer] is in the hospital and the SW will mail the food pantry list again .]        Follow up plan: Follow up call scheduled for 01/14/2024 at 1:30 pm    Encounter Outcome:  Patient Visit  Completed   Jeanie Cooks, PhD [facility], [facility] Social Worker Direct Dial: [phone]  Fax: [phone]

## 2023-12-30 DIAGNOSIS — I4891 Unspecified atrial fibrillation: Secondary | ICD-10-CM | POA: Diagnosis not present

## 2023-12-31 ENCOUNTER — Telehealth: Payer: Self-pay

## 2023-12-31 DIAGNOSIS — J9601 Acute respiratory failure with hypoxia: Secondary | ICD-10-CM | POA: Diagnosis not present

## 2023-12-31 DIAGNOSIS — J9620 Acute and chronic respiratory failure, unspecified whether with hypoxia or hypercapnia: Secondary | ICD-10-CM | POA: Diagnosis not present

## 2023-12-31 DIAGNOSIS — Z7189 Other specified counseling: Secondary | ICD-10-CM | POA: Diagnosis not present

## 2023-12-31 DIAGNOSIS — N1832 Chronic kidney disease, stage 3b: Secondary | ICD-10-CM | POA: Diagnosis not present

## 2023-12-31 DIAGNOSIS — G4733 Obstructive sleep apnea (adult) (pediatric): Secondary | ICD-10-CM | POA: Diagnosis not present

## 2023-12-31 DIAGNOSIS — I5033 Acute on chronic diastolic (congestive) heart failure: Secondary | ICD-10-CM | POA: Diagnosis not present

## 2023-12-31 DIAGNOSIS — Z515 Encounter for palliative care: Secondary | ICD-10-CM | POA: Diagnosis not present

## 2023-12-31 DIAGNOSIS — N179 Acute kidney failure, unspecified: Secondary | ICD-10-CM | POA: Diagnosis not present

## 2023-12-31 DIAGNOSIS — R0602 Shortness of breath: Secondary | ICD-10-CM | POA: Diagnosis not present

## 2023-12-31 DIAGNOSIS — I4891 Unspecified atrial fibrillation: Secondary | ICD-10-CM | POA: Diagnosis not present

## 2023-12-31 NOTE — Telephone Encounter (Signed)
 Copied from CRM [id number]. Topic: Clinical - Home Health Verbal Orders >> Dec 31, 2023 12:11 PM [place] J wrote:  Caller/Agency: Sheralyn Boatman / [facility] Any new concerns about the patient? Yes Patient is going to be discharged from hospital. Resumption of care begining Monday.

## 2024-01-01 DIAGNOSIS — J90 Pleural effusion, not elsewhere classified: Secondary | ICD-10-CM | POA: Diagnosis not present

## 2024-01-01 DIAGNOSIS — R918 Other nonspecific abnormal finding of lung field: Secondary | ICD-10-CM | POA: Diagnosis not present

## 2024-01-01 DIAGNOSIS — J9601 Acute respiratory failure with hypoxia: Secondary | ICD-10-CM | POA: Diagnosis not present

## 2024-01-01 DIAGNOSIS — Z515 Encounter for palliative care: Secondary | ICD-10-CM | POA: Diagnosis not present

## 2024-01-01 DIAGNOSIS — I4891 Unspecified atrial fibrillation: Secondary | ICD-10-CM | POA: Diagnosis not present

## 2024-01-01 DIAGNOSIS — J810 Acute pulmonary edema: Secondary | ICD-10-CM | POA: Diagnosis not present

## 2024-01-01 DIAGNOSIS — R5381 Other malaise: Secondary | ICD-10-CM | POA: Diagnosis not present

## 2024-01-02 DIAGNOSIS — J9601 Acute respiratory failure with hypoxia: Secondary | ICD-10-CM | POA: Diagnosis not present

## 2024-01-02 DIAGNOSIS — J810 Acute pulmonary edema: Secondary | ICD-10-CM | POA: Diagnosis not present

## 2024-01-02 DIAGNOSIS — I4891 Unspecified atrial fibrillation: Secondary | ICD-10-CM | POA: Diagnosis not present

## 2024-01-03 DIAGNOSIS — I4891 Unspecified atrial fibrillation: Secondary | ICD-10-CM | POA: Diagnosis not present

## 2024-01-04 ENCOUNTER — Telehealth: Payer: Self-pay | Admitting: Emergency Medicine

## 2024-01-04 ENCOUNTER — Telehealth: Payer: Self-pay | Admitting: *Deleted

## 2024-01-04 NOTE — Transitions of Care (Post Inpatient/ED Visit) (Addendum)
 01/04/2024  Name: [name] MRN: [id number] DOB: June 29, 1941  Today's TOC FU Call Status: Today's TOC FU Call Status:: Successful TOC FU Call Completed TOC FU Call Complete Date: 01/04/24 Patient's Name and Date of Birth confirmed.  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  I am doing just fine, the home health people are still coming.  I am managing all of my medications but I will take them to Dr. Latrelle Dodrill office to get better instructions as you advise.") Any questions or concerns?: No  Items Reviewed: Did you receive and understand the discharge instructions provided?: Yes (attempted review with patient and her husband who verbalizes overall fair understanding of same - outside hospital AVS: cultural barrier) Medications obtained,verified, and reconciled?: Partial Review Completed Reason for Partial Mediation Review: Patient is easily confused around her current medication list- states she is waiting for home health to come today to help her understand current post-hospital discharge medication instructions- we attempted to review medications during TOC call, but patient and her husband are very frustrated trying to review over phone Any new allergies since your discharge?: No Dietary orders reviewed?: Yes Type of Diet Ordered:: "Heart Healthy" Do you have support at home?: Yes People in Home: spouse Name of Support/Comfort Primary Source: Reports requires minimal assistance with self-care activities; supportive spouse and friends assists as/ if needed/  indicated  Medications Reviewed Today: Medications Reviewed Today     Reviewed by [name] Corner, RN (Registered Nurse) on 01/04/24 at 1147  Med List Status: <None>   Medication Order Taking? Sig Documenting Provider Last Dose Status Informant  Accu-Chek Softclix Lancets lancets [id number]  1 each by Other route 3 (three) times daily. as directed Janeece Agee, NP  Active Self           Med Note Jonnie Kind Nov 05, 2023 11:28 AM) 11/05/23:  Patient declined full medication review during [facility] call; states her husband manages medications- states she has no concerns/ questions around current medications and is taking all as prescribed post-hospital discharge on 11/05/23   allopurinol (ZYLOPRIM) 300 MG tablet [id number] Yes Take 0.5 tablets (150 mg total) by mouth daily. TAKE 1/2 tablet daily Georgina Quint, MD Taking Active   amiodarone (PACERONE) 400 MG tablet [id number] Yes 400 mg daily. [provider] Taking Active            Med Note [name] Corner   Mon Jan 04, 2024 11:20 AM) 01/04/24: confirmed during [facility] call that she has continues taking decreased dose to 200 mg po every day, as advised by [facility] cardiology provider on 12/07/23   BD PEN NEEDLE NANO 2ND GEN 32G X 4 MM MISC [id number]  USE TWICE DAILY AS NEEDED Georgina Quint, MD  Active   blood glucose meter kit and supplies [id number]  Dispense based on patient and insurance preference. Use up to four times daily as directed. (FOR ICD-10 E10.9, E11.9). Georgina Quint, MD  Active Self  cephALEXin [facility]) 250 MG capsule [id number] Yes Take 250 mg by mouth daily. 01/04/24: confirmed during TOC call-- she has been taking QD, since "one of the hospital visits;" reports  she "has no idea" why this medication was prescribed- confirmed prescribed by [facility] hospitalist, per patient report Georgina Quint, MD Taking Active Self           Med Note [name] Corner   Mon Jan 04, 2024 11:23 AM) 01/04/24:  confirmed during [facility] call-- she has been taking QD, since "one of the hospital visits;" reports she "has no idea" why this medication was prescribed- confirmed prescribed by [facility] hospitalist, per patient report   Continuous Blood Gluc Receiver (FREESTYLE LIBRE 2 READER) DEVI [id number]  Use to check blood glucose. Use as directed Georgina Quint, MD  Active   Continuous Glucose Sensor (FREESTYLE LIBRE 2 SENSOR) [place] [id number]  USE AS DIRECTED TO CHECK BLOOD  SUGAR DAILY [name], Konrad Dolores, MD  Active   diltiazem (CARDIZEM CD) 180 MG 24 hr capsule [id number]  Take 1 capsule (180 mg total) by mouth daily. Georgina Quint, MD  Active   diltiazem [facility]) 180 MG 24 hr capsule [id number]  Take 180 mg by mouth daily. [provider]  Active   FEROSUL 325 (65 Fe) MG tablet [id number] Yes Take 325 mg by mouth every other day. [provider] Taking Active   fluconazole (DIFLUCAN) 100 MG tablet [id number]  Take by mouth. [provider]  Active   Glucagon (GVOKE HYPOPEN 2-PACK) 0.5 MG/0.1ML SOAJ [id number]  Inject 0.5 mg into the skin daily as needed. For Hypoglycemic events Georgina Quint, MD  Active            Med Note [name] Corner   Tue Dec 01, 2023 10:21 AM) 12/01/23: Reports during TOC 30-day program outreach:  she does not have this medication at her home/ reports "no recent low blood sugars"  Insulin Lispro Prot & Lispro (HUMALOG MIX 75/25 KWIKPEN) (75-25) 100 UNIT/ML Stephanie Coup [id number]  Inject 20 Units into the skin 2 (two) times daily before a meal. [name], Konrad Dolores, MD  Active            Med Note [name] Corner   Tue Dec 01, 2023 10:18 AM) 12/01/23- reports during [facility] 30-day program she is taking only as needed around her daily blood sugars- reports she takes "if my blood sugar goes up to 180 or above"  levothyroxine (SYNTHROID) 112 MCG tablet [id number] Yes Take 1 tablet (112 mcg total) by mouth daily. Georgina Quint, MD Taking  Active   metoprolol succinate (TOPROL-XL) 100 MG 24 hr tablet [id number] Yes Take 100 mg by mouth 2 (two) times daily. [provider] Taking Active   pantoprazole (PROTONIX) 40 MG tablet [id number]  Take by mouth. [provider]  Active     Discontinued 03/06/22 1303   rosuvastatin (CRESTOR) 10 MG tablet [id number]  TAKE 1 TABLET BY MOUTH IN THE  EVENING Sagardia, Eilleen Kempf, MD  Active   sertraline (ZOLOFT) 25 MG tablet [id number] Yes Take 1 tablet (25 mg total) by mouth daily. Georgina Quint, MD Taking Active   spironolactone (ALDACTONE) 25 MG tablet [id number] Yes Take 25 mg by mouth daily. Georgina Quint, MD Taking Active Self           Med Note Marilu Favre Jan 04, 2024 11:27 AM) 01/04/24: confirmed during [facility] call-- she has been taking QD, she is unsure why this was not on her current medication list    torsemide (DEMADEX) 20 MG tablet [id number] Yes Take 1 tablet (20 mg total) by mouth daily. Please keep  scheduled appointment for future refills. Thank you. Duke Salvia, MD Taking Active            Med Note [name] Corner   Mon Jan 04, 2024 11:25 AM) 01/04/24: confirmed during [facility] call-- she is now taking 60 mg QD, since hospital discharge on 01/03/24               Home Care and Equipment/Supplies: Were Home Health Services Ordered?: Yes Name of Home Health Agency:: [facility]: was active prior to recent hospitalization: confirmed services to resume post-recent hospital discharge on 01/03/24 Has Agency set up a time to come to your home?: Yes First Home Health Visit Date: 01/04/24 Any new equipment or medical supplies ordered?: No  Functional Questionnaire: Do you need assistance with bathing/showering or dressing?: Yes (Requires supervision from friends/ family) Do you need assistance with meal preparation?: Yes (Requires assistance/ supervision from friends/ family) Do you need assistance with eating?: No Do you have  difficulty maintaining continence: Yes Do you need assistance with getting out of bed/getting out of a chair/moving?: No (Requires assistance/ supervision from friends/ family as indicated) Do you have difficulty managing or taking your medications?: Yes (Requires assistance/ supervision from friends/ family-- reports home health is also helping)  Follow up appointments reviewed: PCP Follow-up appointment confirmed?: Yes (care coordination outreach in real-time with scheduling care guide to successfully schedule hospital follow up PCP appointment 01/06/24) Date of PCP follow-up appointment?: 01/06/24 Follow-up Provider: Alvy Bimler- PCP Specialist Hospital Follow-up appointment confirmed?: NA (verified not indicated per outside hospital discharging provider discharge notes) Do you need transportation to your follow-up appointment?: No Do you understand care options if your condition(s) worsen?: Yes-patient verbalized understanding  SDOH Interventions Today    Flowsheet Row Most Recent Value  SDOH Interventions   Food Insecurity Interventions Intervention Not Indicated  [denies food insecurity today during TOC call,  however, confirmed LCSW team is currently assisting patient with food insecurity resources]  Housing Interventions Intervention Not Indicated  Transportation Interventions Intervention Not Indicated  [Reports friends and family provide transportation]  Utilities Interventions Intervention Not Indicated      Interventions Today    Flowsheet Row Most Recent Value  Chronic Disease   Chronic disease during today's visit Congestive Heart Failure (CHF), Atrial Fibrillation (AFib)  General Interventions   General Interventions Discussed/Reviewed General Interventions Discussed, Durable Medical Equipment (DME), Doctor Visits, Level of Care  Doctor Visits Discussed/Reviewed PCP, Doctor Visits Discussed  Durable Medical Equipment (DME) Val Riles uses assistive devices on regular  basis, at baseline -- walker]  PCP/Specialist Visits Compliance with follow-up visit  Level of Care --  [facility] Care/ Hospice services: again encouraged patient to consider engaging with team for in-home support: again provided education around difference between hospice and palliative care services]  Exercise Interventions   Exercise Discussed/Reviewed Exercise Discussed  [facility] PT services- confirmed active,  encouraged patient's active participation/ engagement]  Education Interventions   Education Provided Provided Education  Provided Verbal Education On Other, Medication  [hospice vs. palliative care services: unfortunately, patient appears to remain very confused around this, despite multiple prior conversations she and I have had previously]  Nutrition Interventions   Nutrition Discussed/Reviewed Nutrition Discussed  Pharmacy Interventions   Pharmacy Dicussed/Reviewed Pharmacy Topics Discussed  Henreitta Cea medication review: patient very frustrated around completing medication review by phone- she was ehncouraged to continue using home health services to assist as well at to clarify all medications during hospital follow up office visit 01/06/24]  Safety Interventions   Safety Discussed/Reviewed Safety Discussed, Fall Risk  [provided education/ reinforcement around fall prevention]      TOC Interventions Today    Flowsheet Row Most Recent Value  TOC Interventions   TOC Interventions Discussed/Reviewed TOC Interventions Discussed, Arranged PCP follow up within 7 days/Care Guide scheduled  [confirmed has scheduled appointment for ongoing follow up with longitudinal RN CM on 01/06/24: encouraged patient to fully engage with RN CM and to maintain engagement for ongoing support over time]      Total time spent from review to signing of note/ including any care coordination interventions:  78 minutes: cultural barrier- patient frustration around medication review over phone; Fragile  state of health, multiple progressing chronic health conditions--- requires frequent and ongoing repetition and reinforcement throughout each and every call  Pls call/ message for questions,  Caryl Pina, RN, BSN, CCRN Alumnus RN Care Manager  Transitions of Care  [facility] [phone]: direct office

## 2024-01-04 NOTE — Telephone Encounter (Signed)
 Copied from CRM [id number]. Topic: Clinical - Home Health Verbal Orders >> Jan 04, 2024  2:52 PM Adele Barthel wrote: Caller/Agency: Luisa Hart, Nurse Case Manager with [facility] Callback Number: [phone] Service Requested: Physical Therapy, Speech Therapy, and Skilled Nursing Frequency: Skilled nursing- 1 x a week for 4 weeks  PT, OT- evaluation for this week.  Any new concerns about the patient? No

## 2024-01-06 ENCOUNTER — Ambulatory Visit: Admitting: Emergency Medicine

## 2024-01-06 ENCOUNTER — Encounter: Payer: Self-pay | Admitting: Emergency Medicine

## 2024-01-06 ENCOUNTER — Ambulatory Visit: Payer: Self-pay | Admitting: *Deleted

## 2024-01-06 VITALS — BP 124/72 | HR 122 | Temp 98.2°F | Ht 61.0 in | Wt 248.0 lb

## 2024-01-06 DIAGNOSIS — G4733 Obstructive sleep apnea (adult) (pediatric): Secondary | ICD-10-CM

## 2024-01-06 DIAGNOSIS — E1169 Type 2 diabetes mellitus with other specified complication: Secondary | ICD-10-CM | POA: Diagnosis not present

## 2024-01-06 DIAGNOSIS — E039 Hypothyroidism, unspecified: Secondary | ICD-10-CM

## 2024-01-06 DIAGNOSIS — I2510 Atherosclerotic heart disease of native coronary artery without angina pectoris: Secondary | ICD-10-CM | POA: Diagnosis not present

## 2024-01-06 DIAGNOSIS — N1832 Chronic kidney disease, stage 3b: Secondary | ICD-10-CM

## 2024-01-06 DIAGNOSIS — E1159 Type 2 diabetes mellitus with other circulatory complications: Secondary | ICD-10-CM | POA: Diagnosis not present

## 2024-01-06 DIAGNOSIS — K219 Gastro-esophageal reflux disease without esophagitis: Secondary | ICD-10-CM | POA: Diagnosis not present

## 2024-01-06 DIAGNOSIS — E785 Hyperlipidemia, unspecified: Secondary | ICD-10-CM

## 2024-01-06 DIAGNOSIS — I5042 Chronic combined systolic (congestive) and diastolic (congestive) heart failure: Secondary | ICD-10-CM | POA: Diagnosis not present

## 2024-01-06 DIAGNOSIS — I700 Atherosclerosis of aorta: Secondary | ICD-10-CM

## 2024-01-06 DIAGNOSIS — Z23 Encounter for immunization: Secondary | ICD-10-CM | POA: Diagnosis not present

## 2024-01-06 DIAGNOSIS — I480 Paroxysmal atrial fibrillation: Secondary | ICD-10-CM | POA: Diagnosis not present

## 2024-01-06 DIAGNOSIS — I152 Hypertension secondary to endocrine disorders: Secondary | ICD-10-CM

## 2024-01-06 DIAGNOSIS — Z09 Encounter for follow-up examination after completed treatment for conditions other than malignant neoplasm: Secondary | ICD-10-CM

## 2024-01-06 NOTE — Assessment & Plan Note (Signed)
 Stable. Asymptomatic.

## 2024-01-06 NOTE — Assessment & Plan Note (Signed)
Stable.  Continues on CPAP treatment.

## 2024-01-06 NOTE — Assessment & Plan Note (Signed)
Chronic stable condition Diet and nutrition discussed Continue rosuvastatin 10 mg daily

## 2024-01-06 NOTE — Assessment & Plan Note (Signed)
Continues to be monitored by nephrology service Advised to stay well-hydrated and avoid NSAIDs

## 2024-01-06 NOTE — Assessment & Plan Note (Signed)
No recent anginal episodes Stable chronic condition

## 2024-01-06 NOTE — Assessment & Plan Note (Signed)
 No signs of acute CHF. No peripheral edema Continues Demadex 20 mg daily Hypertension well-controlled Continues metoprolol succinate 100 mg daily and Cardizem CD1 180 mg daily

## 2024-01-06 NOTE — Patient Outreach (Signed)
 Care Coordination    01/06/2024 Name: [name] MRN: [id number] DOB: 1941-08-06  [name] is a 83 y.o. year old female who sees Sagardia, Eilleen Kempf, MD for primary care. I spoke with  [name] by phone today when she returned my unsuccessful call.   I provided information about the Population Health Chronic Care Management Program and that Kathyrn Sheriff, RN Care Manager is the assigned Nurse Care Manager for her primary care office of [facility] [place].   Appointment scheduled with Kathyrn Sheriff, RN for 01/08/24 at 2:00. Patient is aware. I notified care guide, Staci Ashworth, and Donn Pierini of the patient contact and appointment information. I forwarded relevant communication from the [facility] Nurse to [facility] for review.   Demetrios Loll, RN, BSN [place]  [facility], [facility] RN Care Manager Direct Dial: [phone]

## 2024-01-06 NOTE — Assessment & Plan Note (Signed)
Clinically stable Continues levothyroxine 112 mcg daily

## 2024-01-06 NOTE — Patient Instructions (Signed)
 Health Maintenance After Age 81 After age 4, you are at a higher risk for certain long-term diseases and infections as well as injuries from falls. Falls are a major cause of broken bones and head injuries in people who are older than age 47. Getting regular preventive care can help to keep you healthy and well. Preventive care includes getting regular testing and making lifestyle changes as recommended by your health care provider. Talk with your health care provider about: Which screenings and tests you should have. A screening is a test that checks for a disease when you have no symptoms. A diet and exercise plan that is right for you. What should I know about screenings and tests to prevent falls? Screening and testing are the best ways to find a health problem early. Early diagnosis and treatment give you the best chance of managing medical conditions that are common after age 37. Certain conditions and lifestyle choices may make you more likely to have a fall. Your health care provider may recommend: Regular vision checks. Poor vision and conditions such as cataracts can make you more likely to have a fall. If you wear glasses, make sure to get your prescription updated if your vision changes. Medicine review. Work with your health care provider to regularly review all of the medicines you are taking, including over-the-counter medicines. Ask your health care provider about any side effects that may make you more likely to have a fall. Tell your health care provider if any medicines that you take make you feel dizzy or sleepy. Strength and balance checks. Your health care provider may recommend certain tests to check your strength and balance while standing, walking, or changing positions. Foot health exam. Foot pain and numbness, as well as not wearing proper footwear, can make you more likely to have a fall. Screenings, including: Osteoporosis screening. Osteoporosis is a condition that causes  the bones to get weaker and break more easily. Blood pressure screening. Blood pressure changes and medicines to control blood pressure can make you feel dizzy. Depression screening. You may be more likely to have a fall if you have a fear of falling, feel depressed, or feel unable to do activities that you used to do. Alcohol use screening. Using too much alcohol can affect your balance and may make you more likely to have a fall. Follow these instructions at home: Lifestyle Do not drink alcohol if: Your health care provider tells you not to drink. If you drink alcohol: Limit how much you have to: 0-1 drink a day for women. 0-2 drinks a day for men. Know how much alcohol is in your drink. In the [place] one drink equals one 12 oz bottle of beer (355 mL), one 5 oz glass of wine (148 mL), or one 1 oz glass of hard liquor (44 mL). Do not use any products that contain nicotine or tobacco. These products include cigarettes, chewing tobacco, and vaping devices, such as e-cigarettes. If you need help quitting, ask your health care provider. Activity  Follow a regular exercise program to stay fit. This will help you maintain your balance. Ask your health care provider what types of exercise are appropriate for you. If you need a cane or walker, use it as recommended by your health care provider. Wear supportive shoes that have nonskid soles. Safety  Remove any tripping hazards, such as rugs, cords, and clutter. Install safety equipment such as grab bars in bathrooms and safety rails on stairs. Keep rooms and walkways  well-lit. General instructions Talk with your health care provider about your risks for falling. Tell your health care provider if: You fall. Be sure to tell your health care provider about all falls, even ones that seem minor. You feel dizzy, tiredness (fatigue), or off-balance. Take over-the-counter and prescription medicines only as told by your health care provider. These include  supplements. Eat a healthy diet and maintain a healthy weight. A healthy diet includes low-fat dairy products, low-fat (lean) meats, and fiber from whole grains, beans, and lots of fruits and vegetables. Stay current with your vaccines. Schedule regular health, dental, and eye exams. Summary Having a healthy lifestyle and getting preventive care can help to protect your health and wellness after age 11. Screening and testing are the best way to find a health problem early and help you avoid having a fall. Early diagnosis and treatment give you the best chance for managing medical conditions that are more common for people who are older than age 28. Falls are a major cause of broken bones and head injuries in people who are older than age 48. Take precautions to prevent a fall at home. Work with your health care provider to learn what changes you can make to improve your health and wellness and to prevent falls. This information is not intended to replace advice given to you by your health care provider. Make sure you discuss any questions you have with your health care provider. Document Revised: 02/25/2021 Document Reviewed: 02/25/2021 Elsevier Patient Education  2024 [employer].

## 2024-01-06 NOTE — Assessment & Plan Note (Signed)
 Well-controlled hypertension Continues metoprolol succinate 100 mg and Cardizem CD 180 mg daily Taking Humalog 75/25 20 units twice a day Good glucose readings at home Sees endocrinologist on a regular basis

## 2024-01-06 NOTE — Patient Outreach (Signed)
 Care Coordination   01/06/2024 Name: Secret Kristensen MRN: [id number] DOB: 24-Sep-1941   Care Coordination Outreach Attempts:  An unsuccessful outreach was attempted for an appointment today.  Follow Up Plan:  Additional outreach attempts will be made to offer the patient complex care management information and services.   Encounter Outcome:  No Answer. Unable to leave a VM.   Care Coordination Interventions:  No, not indicated. Staff message to Care Guide, Burman Nieves requesting outreach and scheduling with Kathyrn Sheriff, RN Care Manager for [facility] [place]. Forwarded [facility] staff message communication to [place] for review prior to appointment.     Demetrios Loll, RN, BSN [place]  [facility], [facility] RN Care Manager Direct Dial: [phone]

## 2024-01-06 NOTE — Assessment & Plan Note (Signed)
 Sinus rhythm with good ventricular response today Continues metoprolol succinate 100 mg daily and Cardizem CD1 180 mg daily No longer on Eliquis Continue amiodarone 400 mg daily

## 2024-01-06 NOTE — Telephone Encounter (Signed)
 OKAY verbal orders with Luisa Hart

## 2024-01-06 NOTE — Assessment & Plan Note (Signed)
 Continue rosuvastatin 10 mg daily Insulin 20 units twice a day

## 2024-01-06 NOTE — Progress Notes (Signed)
 [name] 83 y.o.   Chief Complaint  Patient presents with   Hospitalization Follow-up    Patient here for HFU states no other concerns.    HISTORY OF PRESENT ILLNESS: This is a 83 y.o. female here for hospital discharge follow-up Admitted on 12/25/2023 with difficulty breathing and A-fib with fast ventricular response Accompanied by friend today. Feeling much better today.  Has no complaints or medical concerns.  HPI   Prior to Admission medications   Medication Sig Start Date End Date Taking? Authorizing Provider  Accu-Chek Softclix Lancets lancets 1 each by Other route 3 (three) times daily. as directed 03/30/20  Yes Janeece Agee, NP  allopurinol (ZYLOPRIM) 300 MG tablet Take 0.5 tablets (150 mg total) by mouth daily. TAKE 1/2 tablet daily 12/24/23  Yes Lilly Gasser, Eilleen Kempf, MD  amiodarone (PACERONE) 400 MG tablet 400 mg daily. 11/21/23  Yes [provider]  BD PEN NEEDLE NANO 2ND GEN 32G X 4 MM MISC USE TWICE DAILY AS NEEDED 08/02/22  Yes Braylee Lal, Eilleen Kempf, MD  blood glucose meter kit and supplies Dispense based on patient and insurance preference. Use up to four times daily as directed. (FOR ICD-10 E10.9, E11.9). 08/29/21  Yes Margarito Dehaas, Eilleen Kempf, MD  Continuous Blood Gluc Receiver (FREESTYLE LIBRE 2 READER) DEVI Use to check blood glucose. Use as directed 01/08/23  Yes Kaidan Harpster, Eilleen Kempf, MD  Continuous Glucose Sensor (FREESTYLE LIBRE 2 SENSOR) MISC USE AS DIRECTED TO CHECK BLOOD  SUGAR DAILY 08/05/23  Yes [name], Konrad Dolores, MD  diltiazem (CARDIZEM CD) 180 MG 24 hr capsule Take 1 capsule (180 mg total) by mouth daily. 12/29/23  Yes Nirav Sweda, Eilleen Kempf, MD  diltiazem [facility]) 180 MG 24 hr capsule Take 180 mg by mouth daily. 11/20/23  Yes [provider]  FEROSUL 325 (65 Fe) MG tablet Take 325 mg by mouth every other day. 10/22/23  Yes [provider]  fluconazole (DIFLUCAN) 100 MG tablet Take by mouth. 12/08/23  Yes [provider]  Glucagon (GVOKE HYPOPEN 2-PACK) 0.5 MG/0.1ML SOAJ Inject 0.5 mg into the skin daily as needed. For Hypoglycemic events 05/30/22  Yes Kuper Rennels, Eilleen Kempf, MD  Insulin Lispro Prot & Lispro (HUMALOG MIX 75/25 KWIKPEN) (75-25) 100 UNIT/ML Kwikpen Inject 20 Units into the skin 2 (two) times daily before a meal. 07/07/23  Yes [name], Konrad Dolores, MD  levothyroxine (SYNTHROID) 112 MCG tablet Take 1 tablet (112 mcg total) by mouth daily. 11/16/23  Yes Cregg Jutte, Eilleen Kempf, MD  metoprolol succinate (TOPROL-XL) 100 MG 24 hr tablet Take 100 mg by mouth 2 (two) times daily.   Yes [provider]  pantoprazole (PROTONIX) 40 MG tablet Take by mouth.   Yes [provider]  rosuvastatin (CRESTOR) 10 MG tablet TAKE 1 TABLET BY MOUTH IN THE  EVENING 02/11/23  Yes Wang Granada, Eilleen Kempf, MD  torsemide (DEMADEX) 20 MG tablet Take 1 tablet (20 mg total) by mouth daily. Please keep scheduled appointment for future refills. Thank you. 08/07/23  Yes Duke Salvia, MD  cephALEXin (KEFLEX) 250 MG capsule Take 250 mg by mouth daily. 01/04/24: confirmed during TOC call-- she has been taking QD, since "one of the hospital visits;" reports she "has no idea" why this medication was prescribed- confirmed prescribed by [facility] hospitalist, per patient report Patient not taking: Reported on 01/06/2024    Georgina Quint, MD  sertraline (ZOLOFT) 25 MG tablet Take 1 tablet (25 mg total) by mouth daily. Patient not taking: Reported on 01/06/2024 12/24/23  Georgina Quint, MD  spironolactone (ALDACTONE) 25 MG tablet Take 25 mg by mouth daily. Patient not taking: Reported on 01/06/2024    Georgina Quint, MD  potassium chloride (KLOR-CON) 10 MEQ tablet Take 4 tablets (40 mEq total) by mouth daily. 12/10/21 03/06/22  Graciella Freer, PA-C    Allergies  Allergen Reactions   Doxycycline     Made patient feel very ill    Metformin Diarrhea    Patient Active Problem List    Diagnosis Date Noted   Recent urinary tract infection 11/24/2023   Right foot pain 07/28/2023   Hyperkalemia, diminished renal excretion 05/27/2023   Allergic rhinitis 01/15/2023   Stage 3b chronic kidney disease (HCC) 01/07/2023   Primary hyperaldosteronism (HCC) 03/06/2022   Coronary artery disease involving native coronary artery of native heart without angina pectoris 02/12/2022   Adrenal adenoma, right 02/07/2022   Skin lesion of right arm 02/02/2022   Asthma 11/01/2021   Normocytic anemia 10/11/2021   Nodule of right lung 09/30/2021   Dysthymia 03/12/2021   Primary osteoarthritis involving multiple joints 03/01/2021   Recurrent falls 01/10/2021   Sensorineural hearing loss (SNHL), 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   Persistent atrial fibrillation (HCC) 08/12/2020   Type 2 diabetes mellitus with hyperglycemia, with long-term current use of insulin (HCC) 06/21/2020   Dyslipidemia associated with type 2 diabetes mellitus (HCC) 06/21/2020   Mixed hyperlipidemia 06/21/2020   Uncontrolled hypertension 06/11/2020   Aortic atherosclerosis (HCC) 03/28/2020   Diabetic peripheral neuropathy (HCC) 12/26/2019   Gastroesophageal reflux disease without esophagitis 03/17/2019   Varicose veins of both legs with edema 02/16/2019   Heart murmur 09/05/2018   Restrictive lung disease 02/22/2018   Dysgeusia 06/11/2017   Osteoarthritis of left knee 06/03/2017   NAFLD (nonalcoholic fatty liver disease) 42/59/5638   Osteoporosis 12/31/2015   Obesity 12/19/2015   Acquired hypothyroidism 10/18/2015   Prolonged QT interval 10/03/2013   CHF (congestive heart failure) (HCC) 04/21/2013   Long term (current) use of anticoagulants 06/09/2012   BENIGN NEOPLASM OF ADRENAL GLAND 11/25/2010   Chronic diastolic heart failure (HCC) 02/20/2010   Diabetes (HCC) 05/16/2009   GOUT 05/16/2009   OBESITY, MORBID 05/16/2009   Hypertension associated with diabetes (HCC) 05/16/2009   Paroxysmal atrial fibrillation (HCC)  05/16/2009   HYPERLIPIDEMIA 11/30/2008   Obstructive sleep apnea on CPAP 11/30/2008    Past Medical History:  Diagnosis Date   Arthritis    Back pain    BCC (basal cell carcinoma) 04/01/2023   right lower chin, Mohs completed on 04/21/23   Chronic anticoagulation    due to aflutter   Chronic kidney disease    Diabetes mellitus    Diastolic CHF, chronic (HCC)    a.  echo 2006 - ef 55-65%; mild diast dysfxn;    b. Echo 08/2011: Mild LVH, EF 60%;  c. 04/2013 Echo: EF 65-69%, mild conc LVH;  08/2014 Echo: EF 60-65%, mild-mod MR.   Gout    Hyperlipidemia    Hypertension    a.  Renal arterial Dopplers 12/2011: 1-59% right renal artery stenosis   Morbid obesity (HCC)    Obstructive sleep apnea on CPAP    Paroxysmal Afib/Flutter    a. 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    Past Surgical History:  Procedure Laterality Date   APPENDECTOMY     ATRIAL FLUTTER ABLATION N/A 09/24/2011   Procedure: ATRIAL FLUTTER ABLATION;  Surgeon: Marinus Maw, MD;  Location: [facility] CATH LAB;  Service: Cardiovascular;  Laterality: N/A;   CARDIOVERSION  10/22/2011   Procedure: CARDIOVERSION;  Surgeon: Duke Salvia, MD;  Location: [facility] OR;  Service: Cardiovascular;  Laterality: N/A;   CARDIOVERSION N/A 09/10/2011   Procedure: CARDIOVERSION;  Surgeon: Duke Salvia, MD;  Location: [facility] CATH LAB;  Service: Cardiovascular;  Laterality: N/A;   CHOLECYSTECTOMY     COLONOSCOPY WITH PROPOFOL N/A 06/13/2021   Procedure: COLONOSCOPY WITH PROPOFOL;  Surgeon: Rachael Fee, MD;  Location: [facility] ENDOSCOPY;  Service: Endoscopy;  Laterality: N/A;   POLYPECTOMY  06/13/2021   Procedure: POLYPECTOMY;  Surgeon: Rachael Fee, MD;  Location: [facility] ENDOSCOPY;  Service: Endoscopy;;   TONSILLECTOMY  1982   TOTAL ABDOMINAL HYSTERECTOMY      Social History   Socioeconomic History   Marital status: Married    Spouse name: Not on file   Number of children: 3   Years of education: Not on file   Highest education level: Not on file   Occupational History   Occupation: DISABILITY/housewife    Employer: RETIRED  Tobacco Use   Smoking status: Never   Smokeless tobacco: Never  Vaping Use   Vaping status: Never Used  Substance and Sexual Activity   Alcohol use: No   Drug use: No   Sexual activity: Yes  Other Topics Concern   Not on file  Social History Narrative   Lives with husband.   Social Drivers of Corporate investment banker Strain: Low Risk  (12/07/2023)   Received from [facility]   Overall Financial Resource Strain (CARDIA)    Difficulty of Paying Living Expenses: Not hard at all  Recent Concern: Financial Resource Strain - High Risk (11/23/2023)   Overall Financial Resource Strain (CARDIA)    Difficulty of Paying Living Expenses: Very hard  Food Insecurity: No Food Insecurity (01/04/2024)   Hunger Vital Sign    Worried About Running Out of Food in the Last Year: Never true    Ran Out of Food in the Last Year: Never true  Recent Concern: Food Insecurity - Food Insecurity Present (12/29/2023)   Hunger Vital Sign    Worried About Running Out of Food in the Last Year: Sometimes true    Ran Out of Food in the Last Year: Sometimes true  Transportation Needs: No Transportation Needs (01/04/2024)   PRAPARE - Administrator, Civil Service (Medical): No    Lack of Transportation (Non-Medical): No  Recent Concern: Transportation Needs - Unmet Transportation Needs (11/23/2023)   PRAPARE - Transportation    Lack of Transportation (Medical): Yes    Lack of Transportation (Non-Medical): Yes  Physical Activity: Inactive (11/23/2023)   Exercise Vital Sign    Days of Exercise per Week: 0 days    Minutes of Exercise per Session: 0 min  Stress: No Stress Concern Present (12/26/2023)   Received from [facility] of Occupational Health - Occupational Stress Questionnaire    Feeling of Stress : Only a little  Social Connections: Socially Integrated (11/23/2023)   Social Connection and  Isolation Panel [NHANES]    Frequency of Communication with Friends and Family: More than three times a week    Frequency of Social Gatherings with Friends and Family: More than three times a week    Attends Religious Services: More than 4 times per year    Active Member of [employer] or Organizations: Yes    Attends Banker Meetings: Never    Marital Status: Married  Catering manager Violence: Not At Risk (01/04/2024)  Humiliation, Afraid, Rape, and Kick questionnaire    Fear of Current or Ex-Partner: No    Emotionally Abused: No    Physically Abused: No    Sexually Abused: No    Family History  Problem Relation Age of Onset   Heart disease Father    Hypertension Father    Breast cancer Sister    Cancer Sister        breast   Colon cancer Neg Hx    Esophageal cancer Neg Hx    Pancreatic cancer Neg Hx    Liver disease Neg Hx      Review of Systems  Constitutional: Negative.  Negative for chills and fever.  HENT: Negative.  Negative for congestion and sore throat.   Respiratory: Negative.  Negative for cough and shortness of breath.   Cardiovascular: Negative.  Negative for chest pain and palpitations.  Gastrointestinal:  Negative for abdominal pain, diarrhea, nausea and vomiting.  Genitourinary: Negative.  Negative for dysuria and hematuria.  Skin: Negative.  Negative for rash.  Neurological:  Negative for dizziness and headaches.  All other systems reviewed and are negative.   Vitals:   01/06/24 1016  BP: 124/72  Pulse: (!) 122  Temp: 98.2 F (36.8 C)  SpO2: 94%    Physical Exam Vitals reviewed.  Constitutional:      Appearance: Normal appearance. She is obese.  HENT:     Head: Normocephalic.     Mouth/Throat:     Mouth: Mucous membranes are moist.     Pharynx: Oropharynx is clear.  Eyes:     Extraocular Movements: Extraocular movements intact.     Conjunctiva/sclera: Conjunctivae normal.     Pupils: Pupils are equal, round, and reactive to  light.  Cardiovascular:     Rate and Rhythm: Normal rate and regular rhythm.     Pulses: Normal pulses.     Heart sounds: Normal heart sounds.  Pulmonary:     Effort: Pulmonary effort is normal.     Breath sounds: Normal breath sounds.  Musculoskeletal:     Cervical back: No tenderness.     Right lower leg: No edema.     Left lower leg: No edema.  Lymphadenopathy:     Cervical: No cervical adenopathy.  Skin:    General: Skin is warm and dry.     Capillary Refill: Capillary refill takes less than 2 seconds.  Neurological:     General: No focal deficit present.     Mental Status: She is alert and oriented to person, place, and time.  Psychiatric:        Mood and Affect: Mood normal.        Behavior: Behavior normal.      ASSESSMENT & PLAN: A total of 47 minutes was spent with the patient and counseling/coordination of care regarding preparing for this visit, review of most recent office visit notes, review of most recent hospital discharge summary, review of multiple chronic medical conditions and their management, review of all medications, review of most recent bloodwork results, review of health maintenance items, education on nutrition, prognosis, documentation, and need for follow up.   Problem List Items Addressed This Visit       Cardiovascular and Mediastinum   Hypertension associated with diabetes (HCC)   Well-controlled hypertension Continues metoprolol succinate 100 mg and Cardizem CD 180 mg daily Taking Humalog 75/25 20 units twice a day Good glucose readings at home Sees endocrinologist on a regular basis      Paroxysmal  atrial fibrillation (HCC)   Sinus rhythm with good ventricular response today Continues metoprolol succinate 100 mg daily and Cardizem CD1 180 mg daily No longer on Eliquis Continue amiodarone 400 mg daily      CHF (congestive heart failure) (HCC) - Primary   No signs of acute CHF. No peripheral edema Continues Demadex 20 mg  daily Hypertension well-controlled Continues metoprolol succinate 100 mg daily and Cardizem CD1 180 mg daily      Aortic atherosclerosis (HCC)   Chronic stable condition Diet and nutrition discussed Continue rosuvastatin 10 mg daily      Coronary artery disease involving native coronary artery of native heart without angina pectoris   No recent anginal episodes Stable chronic condition        Respiratory   Obstructive sleep apnea on CPAP   Stable. Continues on CPAP treatment.         Digestive   Gastroesophageal reflux disease without esophagitis   Stable. Asymptomatic         Endocrine   Dyslipidemia associated with type 2 diabetes mellitus (HCC)   Continue rosuvastatin 10 mg daily Insulin 20 units twice a day      Acquired hypothyroidism   Clinically stable Continues levothyroxine 112 mcg daily        Genitourinary   Stage 3b chronic kidney disease (HCC)   Continues to be monitored by nephrology service Advised to stay well-hydrated and avoid NSAIDs      Other Visit Diagnoses       Need for vaccination       Relevant Orders   Flu Vaccine Trivalent High Dose (Fluad) (Completed)     Hospital discharge follow-up          Patient Instructions  Health Maintenance After Age 50 After age 38, you are at a higher risk for certain long-term diseases and infections as well as injuries from falls. Falls are a major cause of broken bones and head injuries in people who are older than age 60. Getting regular preventive care can help to keep you healthy and well. Preventive care includes getting regular testing and making lifestyle changes as recommended by your health care provider. Talk with your health care provider about: Which screenings and tests you should have. A screening is a test that checks for a disease when you have no symptoms. A diet and exercise plan that is right for you. What should I know about screenings and tests to prevent falls? Screening and  testing are the best ways to find a health problem early. Early diagnosis and treatment give you the best chance of managing medical conditions that are common after age 28. Certain conditions and lifestyle choices may make you more likely to have a fall. Your health care provider may recommend: Regular vision checks. Poor vision and conditions such as cataracts can make you more likely to have a fall. If you wear glasses, make sure to get your prescription updated if your vision changes. Medicine review. Work with your health care provider to regularly review all of the medicines you are taking, including over-the-counter medicines. Ask your health care provider about any side effects that may make you more likely to have a fall. Tell your health care provider if any medicines that you take make you feel dizzy or sleepy. Strength and balance checks. Your health care provider may recommend certain tests to check your strength and balance while standing, walking, or changing positions. Foot health exam. Foot pain and numbness, as well  as not wearing proper footwear, can make you more likely to have a fall. Screenings, including: Osteoporosis screening. Osteoporosis is a condition that causes the bones to get weaker and break more easily. Blood pressure screening. Blood pressure changes and medicines to control blood pressure can make you feel dizzy. Depression screening. You may be more likely to have a fall if you have a fear of falling, feel depressed, or feel unable to do activities that you used to do. Alcohol use screening. Using too much alcohol can affect your balance and may make you more likely to have a fall. Follow these instructions at home: Lifestyle Do not drink alcohol if: Your health care provider tells you not to drink. If you drink alcohol: Limit how much you have to: 0-1 drink a day for women. 0-2 drinks a day for men. Know how much alcohol is in your drink. In the [place] one drink  equals one 12 oz bottle of beer (355 mL), one 5 oz glass of wine (148 mL), or one 1 oz glass of hard liquor (44 mL). Do not use any products that contain nicotine or tobacco. These products include cigarettes, chewing tobacco, and vaping devices, such as e-cigarettes. If you need help quitting, ask your health care provider. Activity  Follow a regular exercise program to stay fit. This will help you maintain your balance. Ask your health care provider what types of exercise are appropriate for you. If you need a cane or walker, use it as recommended by your health care provider. Wear supportive shoes that have nonskid soles. Safety  Remove any tripping hazards, such as rugs, cords, and clutter. Install safety equipment such as grab bars in bathrooms and safety rails on stairs. Keep rooms and walkways well-lit. General instructions Talk with your health care provider about your risks for falling. Tell your health care provider if: You fall. Be sure to tell your health care provider about all falls, even ones that seem minor. You feel dizzy, tiredness (fatigue), or off-balance. Take over-the-counter and prescription medicines only as told by your health care provider. These include supplements. Eat a healthy diet and maintain a healthy weight. A healthy diet includes low-fat dairy products, low-fat (lean) meats, and fiber from whole grains, beans, and lots of fruits and vegetables. Stay current with your vaccines. Schedule regular health, dental, and eye exams. Summary Having a healthy lifestyle and getting preventive care can help to protect your health and wellness after age 94. Screening and testing are the best way to find a health problem early and help you avoid having a fall. Early diagnosis and treatment give you the best chance for managing medical conditions that are more common for people who are older than age 37. Falls are a major cause of broken bones and head injuries in people  who are older than age 59. Take precautions to prevent a fall at home. Work with your health care provider to learn what changes you can make to improve your health and wellness and to prevent falls. This information is not intended to replace advice given to you by your health care provider. Make sure you discuss any questions you have with your health care provider. Document Revised: 02/25/2021 Document Reviewed: 02/25/2021 Elsevier Patient Education  2024 Elsevier Inc.     Edwina Barth, MD [facility]

## 2024-01-07 ENCOUNTER — Ambulatory Visit: Payer: Self-pay

## 2024-01-07 NOTE — Patient Outreach (Signed)
 Care Coordination      Visit Note   01/07/2024 Name: [name] MRN: [id number] DOB: 11-24-1940  [name] is a 83 y.o. year old female who sees Sagardia, Eilleen Kempf, MD for primary care. No patient contact was made during this encounter  Care Coordination/Collaboration with Lars Mage, RNCM: Discussed patient's care needs and follow up. Last PCP office visit and telephone visit with PCP on 01/04/24. In agreement with K. Hudy, plan for RNCM to follow up next week.   SDOH assessments and interventions completed:  No  Care Coordination Interventions:  No, not indicated   Follow up plan:  01/11/24    Encounter Outcome:  Patient Scheduled   Kathyrn Sheriff, RN, MSN, BSN, CCM [place]  [facility], Population Health Case Manager Phone: [phone]

## 2024-01-08 ENCOUNTER — Encounter

## 2024-01-08 DIAGNOSIS — Z794 Long term (current) use of insulin: Secondary | ICD-10-CM | POA: Diagnosis not present

## 2024-01-08 DIAGNOSIS — E1165 Type 2 diabetes mellitus with hyperglycemia: Secondary | ICD-10-CM | POA: Diagnosis not present

## 2024-01-11 ENCOUNTER — Telehealth: Payer: Self-pay

## 2024-01-11 NOTE — Patient Outreach (Signed)
 Care Coordination   01/11/2024 Name: Dorota Heinrichs MRN: [id number] DOB: [birth year]   Care Coordination Outreach Attempts:  An unsuccessful outreach was attempted for an appointment today.  Follow Up Plan:  Additional outreach attempts will be made to offer the patient complex care management information and services.   Encounter Outcome:  No Answer   Care Coordination Interventions:  No, not indicated    Kathyrn Sheriff, RN, MSN, BSN, CCM [place]  [facility], Population Health Case Manager Phone: [phone]

## 2024-01-13 ENCOUNTER — Other Ambulatory Visit: Payer: Self-pay | Admitting: Emergency Medicine

## 2024-01-13 DIAGNOSIS — E1142 Type 2 diabetes mellitus with diabetic polyneuropathy: Secondary | ICD-10-CM

## 2024-01-14 ENCOUNTER — Ambulatory Visit: Payer: Self-pay | Admitting: Licensed Clinical Social Worker

## 2024-01-14 NOTE — Patient Outreach (Signed)
 Care Coordination   Follow Up Visit Note   01/14/2024 Name: [name] MRN: [id number] DOB: [birth year]  [name] is a 83 y.o. year old female who sees Sagardia, Eilleen Kempf, MD for primary care. I spoke with  [name] by phone today.  What matters to the patients health and wellness today?  Food insecurities resources    Goals Addressed             This Visit's Progress    Care Coordination Activities   On track    Care Coordination Interventions:and  Patient stated that her husband is out of work and it has made things hard in a financial way and it has been has caused strain on them buying food and bills. At this time the bills are ok but food has been an issue form time to time. SW discussed the food pantry list and the patient wants a list mailed to to her. The SW will mail out the list. The husband is currently looking for a job and has some applications in at places. Patient is in the hospital and wants the SW to re mail the  food pantry resources  SW will follow up on 01/29/2024 at 1:15 pm        SDOH assessments and interventions completed:  Yes  SDOH Interventions Today    Flowsheet Row Most Recent Value  SDOH Interventions   Food Insecurity Interventions Community Resources Provided, Other (Comment)  [Patient did not receive the food pantry list and SW will send back out]  Housing Interventions Intervention Not Indicated  Transportation Interventions Intervention Not Indicated  Utilities Interventions Intervention Not Indicated        Care Coordination Interventions:  Yes, provided   Follow up plan: Follow up call scheduled for 01/29/2024 at 1:15 pm    Encounter Outcome:  Patient Visit Completed   Jeanie Cooks, PhD [facility], [facility] Social Worker Direct Dial: [phone]  Fax: [phone]

## 2024-01-14 NOTE — Patient Instructions (Signed)
 Visit Information  Thank you for taking time to visit with me today. Please don't hesitate to contact me if I can be of assistance to you.   Following are the goals we discussed today:   Goals Addressed             This Visit's Progress    Care Coordination Activities   On track    Care Coordination Interventions:and  Patient stated that her husband is out of work and it has made things hard in a financial way and it has been has caused strain on them buying food and bills. At this time the bills are ok but food has been an issue form time to time. SW discussed the food pantry list and the patient wants a list mailed to to her. The SW will mail out the list. The husband is currently looking for a job and has some applications in at places. Patient is in the hospital and wants the SW to re mail the  food pantry resources  SW will follow up on 01/29/2024 at 1:15 pm        Our next appointment is by telephone on 01/29/2024 at 1:15 pm  Please call the care guide team at [phone] if you need to cancel or reschedule your appointment.   If you are experiencing a Mental Health or Behavioral Health Crisis or need someone to talk to, please call the Suicide and Crisis Lifeline: 988 go to [facility] [address] [place] [phone]) call 911  The patient verbalized understanding of instructions, educational materials, and care plan provided today and DECLINED offer to receive copy of patient instructions, educational materials, and care plan.   Jeanie Cooks, PhD [facility], [facility] Social Worker Direct Dial: [phone]  Fax: [phone]

## 2024-01-20 ENCOUNTER — Ambulatory Visit: Payer: PPO | Admitting: Emergency Medicine

## 2024-01-25 DIAGNOSIS — I5032 Chronic diastolic (congestive) heart failure: Secondary | ICD-10-CM | POA: Diagnosis not present

## 2024-01-25 DIAGNOSIS — N184 Chronic kidney disease, stage 4 (severe): Secondary | ICD-10-CM | POA: Diagnosis not present

## 2024-01-25 DIAGNOSIS — Z515 Encounter for palliative care: Secondary | ICD-10-CM | POA: Diagnosis not present

## 2024-01-25 DIAGNOSIS — I4811 Longstanding persistent atrial fibrillation: Secondary | ICD-10-CM | POA: Diagnosis not present

## 2024-01-25 DIAGNOSIS — I10 Essential (primary) hypertension: Secondary | ICD-10-CM | POA: Diagnosis not present

## 2024-01-27 DIAGNOSIS — N184 Chronic kidney disease, stage 4 (severe): Secondary | ICD-10-CM | POA: Diagnosis not present

## 2024-01-27 DIAGNOSIS — I5032 Chronic diastolic (congestive) heart failure: Secondary | ICD-10-CM | POA: Diagnosis not present

## 2024-01-29 ENCOUNTER — Other Ambulatory Visit: Payer: Self-pay

## 2024-01-29 NOTE — Patient Instructions (Signed)
 Visit Information  Thank you for taking time to visit with me today. Please don't hesitate to contact me if I can be of assistance to you before our next scheduled appointment.  Your next care management appointment is no further scheduled appointments.    Patient has met all care management goals. Care Management case will be closed. Patient has been provided contact information should new needs arise.   Please call the care guide team at [phone] if you need to cancel, schedule, or reschedule an appointment.   Please call the Suicide and Crisis Lifeline: 988 call the [place] National Suicide Prevention Lifeline: 307-436-8915 or TTY: 585-185-7541 TTY 360-558-0787) to talk to a trained counselor call 1-800-273-TALK (toll free, 24 hour hotline) call 911 if you are experiencing a Mental Health or Behavioral Health Crisis or need someone to talk to.  Gus Puma, Kenard Gower, MHA [place]  [facility] Social Worker, Population Health [phone]

## 2024-01-29 NOTE — Patient Outreach (Signed)
 Complex Care Management   Visit Note  01/29/2024  Name:  [name] MRN: [id number] DOB: 06/21/1941  Situation: Referral received for Complex Care Management related to SDOH Barriers:  Food insecurity I obtained verbal consent from Patient.  Visit completed with Patient  on the phone  Background:   Past Medical History:  Diagnosis Date   Arthritis    Back pain    BCC (basal cell carcinoma) 04/01/2023   right lower chin, Mohs completed on 04/21/23   Chronic anticoagulation    due to aflutter   Chronic kidney disease    Diabetes mellitus    Diastolic CHF, chronic (HCC)    a.  echo 2006 - ef 55-65%; mild diast dysfxn;    b. Echo 08/2011: Mild LVH, EF 60%;  c. 04/2013 Echo: EF 65-69%, mild conc LVH;  08/2014 Echo: EF 60-65%, mild-mod MR.   Gout    Hyperlipidemia    Hypertension    a.  Renal arterial Dopplers 12/2011: 1-59% right renal artery stenosis   Morbid obesity (HCC)    Obstructive sleep apnea on CPAP    Paroxysmal Afib/Flutter    a. 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    Assessment: Patient Reported Symptoms:  Cognitive    Neurological      HEENT      Cardiovascular      Respiratory      Endocrine      Gastrointestinal      Genitourinary      Integumentary      Musculoskeletal      Psychosocial       There were no vitals filed for this visit.  Medications Reviewed Today   Medications were not reviewed in this encounter     Recommendation:   Patient will contact BSW for any future needs.   Follow Up Plan:   Patient has met all care management goals. Care Management case will be closed. Patient has been provided contact information should new needs arise.   Gus Puma, Kenard Gower, MHA [place]  [facility] Social Worker, Population Health [phone]

## 2024-02-08 DIAGNOSIS — E1165 Type 2 diabetes mellitus with hyperglycemia: Secondary | ICD-10-CM | POA: Diagnosis not present

## 2024-02-08 DIAGNOSIS — Z794 Long term (current) use of insulin: Secondary | ICD-10-CM | POA: Diagnosis not present

## 2024-02-09 DIAGNOSIS — Z7901 Long term (current) use of anticoagulants: Secondary | ICD-10-CM | POA: Diagnosis not present

## 2024-02-09 DIAGNOSIS — D649 Anemia, unspecified: Secondary | ICD-10-CM | POA: Diagnosis not present

## 2024-02-09 DIAGNOSIS — N184 Chronic kidney disease, stage 4 (severe): Secondary | ICD-10-CM | POA: Diagnosis not present

## 2024-02-09 DIAGNOSIS — I4811 Longstanding persistent atrial fibrillation: Secondary | ICD-10-CM | POA: Diagnosis not present

## 2024-02-09 DIAGNOSIS — Z515 Encounter for palliative care: Secondary | ICD-10-CM | POA: Diagnosis not present

## 2024-02-09 DIAGNOSIS — I10 Essential (primary) hypertension: Secondary | ICD-10-CM | POA: Diagnosis not present

## 2024-02-09 DIAGNOSIS — I5032 Chronic diastolic (congestive) heart failure: Secondary | ICD-10-CM | POA: Diagnosis not present

## 2024-02-22 ENCOUNTER — Ambulatory Visit: Payer: Medicare HMO | Admitting: Emergency Medicine

## 2024-02-25 ENCOUNTER — Other Ambulatory Visit: Payer: Self-pay

## 2024-02-25 NOTE — Patient Outreach (Signed)
 Complex Care Management   Visit Note  02/25/2024  Name:  [name] MRN: [id number] DOB: [birth year]  Situation: Referral received for Complex Care Management related to Heart Failure and Atrial Fibrillation I obtained verbal consent from Patient.  Visit completed with patient and husband/caregiver  on the phone  Background:   Past Medical History:  Diagnosis Date   Arthritis    Back pain    BCC (basal cell carcinoma) 04/01/2023   right lower chin, Mohs completed on 04/21/23   Chronic anticoagulation    due to aflutter   Chronic kidney disease    Diabetes mellitus    Diastolic CHF, chronic (HCC)    a.  echo 2006 - ef 55-65%; mild diast dysfxn;    b. Echo 08/2011: Mild LVH, EF 60%;  c. 04/2013 Echo: EF 65-69%, mild conc LVH;  08/2014 Echo: EF 60-65%, mild-mod MR.   Gout    Hyperlipidemia    Hypertension    a.  Renal arterial Dopplers 12/2011: 1-59% right renal artery stenosis   Morbid obesity (HCC)    Obstructive sleep apnea on CPAP    Paroxysmal Afib/Flutter    a. dccv: 08/2011 - on amiodarone /coumadin     Assessment: patient reports she is feeling better. Supportive husband assisting in managing patient's health. Spouse reports he has taken a job in the evening so he can ensure that patient has taken her medications and is settled for the day before going to work in the evening.  Patient Reported Symptoms:  Cognitive Cognitive Status: Able to follow simple commands, Alert and oriented to person, place, and time, Normal speech and language skills, Insightful and able to interpret abstract concepts      Neurological Neurological Review of Symptoms: No symptoms reported    HEENT HEENT Symptoms Reported: No symptoms reported      Cardiovascular Cardiovascular Symptoms Reported: No symptoms reported Does patient have uncontrolled Hypertension?: No Cardiovascular Conditions: Hypertension, Heart failure, Dysrhythmia Cardiovascular Management Strategies: Medication therapy,  Routine screening, Adequate rest Cardiovascular Self-Management Outcome: 4 (good) Cardiovascular Comment: admission with afib with rvr/HF. last cardiology visit on 02/09/24 with Dr. Barnabas Lia. Causey([facility]).  Spouse checked pulse oximeter during telephone assessment. Spouse was not clear what the numbers on the pulse oximter meant. Per spouse one number is 95 and the other is 47 "PR" (question accuracy of PR as patient has atrial fibrillation). Per review of chart, apixaban  noted on medication list. per spouse he and cardiologist went over medications patient is to take and he is only giving patient the medications instructed by cardiologist for patient to take. RNCM will notify cardiologist and request clarification and request to contact patient's husband with clarification and/or need for prescription as spouse said he has gotten rid of all the medications that patient was not to take to avoid confusion.  Respiratory Respiratory Symptoms Reported: No symptoms reported Other Respiratory Symptoms: patient denies SOB, denies any problems a this time. Per spouse patient has Oxygen, but does not use. He reports he has a pulse oximeter. checked during telephone asessment. However, spouse is it unclear how to read the pulse oximeter. per spouse one number is 95 and "PR" is 47. RNCM questions accuracy as patent is in atrial fibrillation per cardiology note. Respiratory Conditions: COPD Respiratory Self-Management Outcome: 4 (good)  Endocrine Patient reports the following symptoms related to hypoglycemia or hyperglycemia : No symptoms reported Is patient diabetic?: Yes Is patient checking blood sugars at home?: Yes Endocrine Conditions: Diabetes Endocrine Management Strategies: Adequate rest, Diet  modification, Medication therapy Endocrine Self-Management Outcome: 3 (uncertain) Endocrine Comment: Patient has upcoming appointment with Dr. Rosalea Collin, endocrinologist scheduled for 04/11/64   Gastrointestinal Gastrointestinal Symptoms Reported: No symptoms reported      Genitourinary Genitourinary Symptoms Reported: No symptoms reported    Integumentary Integumentary Symptoms Reported: No symptoms reported    Musculoskeletal Musculoskelatal Symptoms Reviewed: Difficulty walking, Unsteady gait Additional Musculoskeletal Details: walks with walker   Falls in the past year?: No    Psychosocial Psychosocial Symptoms Reported: No symptoms reported     Quality of Family Relationships: supportive      01/06/2024   10:25 AM  Depression screen PHQ 2/9  Decreased Interest 0  Down, Depressed, Hopeless 0  PHQ - 2 Score 0  Altered sleeping 0  Tired, decreased energy 0  Change in appetite 0  Feeling bad or failure about yourself  0  Trouble concentrating 0  Moving slowly or fidgety/restless 0  Suicidal thoughts 0  PHQ-9 Score 0  Difficult doing work/chores Not difficult at all    There were no vitals filed for this visit.  Medications Reviewed Today     Reviewed by Mujahid Jalomo M, RN (Registered Nurse) on 02/25/24 at 1440  Med List Status: <None>   Medication Order Taking? Sig Documenting Provider Last Dose Status Informant  Accu-Chek Softclix Lancets lancets [id number]  1 each by Other route 3 (three) times daily. as directed Ulyess Gammons, NP  Active Self           Med Note (TOUSEY, LAINE M   Thu Nov 05, 2023 11:28 AM) 11/05/23:  Patient declined full medication review during [facility] call; states her husband manages medications- states she has no concerns/ questions around current medications and is taking all as prescribed post-hospital discharge on 11/05/23   allopurinol  (ZYLOPRIM ) 300 MG tablet [id number] Yes Take 0.5 tablets (150 mg total) by mouth daily. TAKE 1/2 tablet daily Sagardia, Miguel Jose, MD Taking Active   amiodarone  (PACERONE ) 400 MG tablet [id number] Yes 400 mg daily. [provider] Taking Active            Med Note (TOUSEY, LAINE M   Mon Jan 04, 2024 11:20 AM) 01/04/24: confirmed during [facility] call that she has continues taking decreased dose to 200 mg po every day, as advised by [facility] cardiology provider on 12/07/23   BD PEN NEEDLE NANO 2ND GEN 32G X 4 MM MISC [id number]  USE TWICE DAILY AS NEEDED Elvira Hammersmith, MD  Active   blood glucose meter kit and supplies [id number]  Dispense based on patient and insurance preference. Use up to four times daily as directed. (FOR ICD-10 E10.9, E11.9). Sagardia, Miguel Jose, MD  Active Self  cephALEXin  (KEFLEX ) 250 MG capsule [id number]  Take 250 mg by mouth daily. 01/04/24: confirmed during TOC call-- she has been taking QD, since "one of the hospital visits;" reports she "has no idea" why this medication was prescribed- confirmed prescribed by [facility] hospitalist, per patient report  Patient not taking: Reported on 01/06/2024   Elvira Hammersmith, MD  Active Self           Med Note (TOUSEY, LAINE M   Mon Jan 04, 2024 11:23 AM) 01/04/24: confirmed during [facility] call-- she has been taking QD, since "one of the hospital visits;" reports she "has no idea" why this medication was prescribed- confirmed prescribed by [facility] hospitalist, per patient report   Continuous Blood Gluc Receiver (FREESTYLE LIBRE 2 READER) DEVI [id number] Yes Use to check blood  glucose. Use as directed Elvira Hammersmith, MD Taking Active   Continuous Glucose Sensor (FREESTYLE LIBRE 2 SENSOR) [place] [id number]  USE AS DIRECTED TO CHECK BLOOD  SUGAR DAILY [name], [name], MD  Active   diltiazem  (CARDIZEM  CD) 180 MG 24 hr capsule [id number] Yes Take 1 capsule (180 mg total) by mouth daily. Sagardia, Miguel Jose, MD Taking Active   diltiazem  (TIAZAC ) 180 MG 24 hr capsule [id number]  Take 180 mg by mouth daily. [provider]  Active   FEROSUL 325 (65 Fe) MG tablet [id number] No Take 325 mg by mouth every other day.  Patient not taking: Reported on 02/25/2024   [provider] Not Taking Active   fluconazole  (DIFLUCAN) 100 MG tablet [id number] No Take by mouth.  Patient not taking: Reported on 02/25/2024   [provider] Not Taking Active   Glucagon  (GVOKE HYPOPEN  2-PACK) 0.5 MG/0.1ML SOAJ [id number] No Inject 0.5 mg into the skin daily as needed. For Hypoglycemic events  Patient not taking: Reported on 02/25/2024   Sagardia, Miguel Jose, MD Not Taking Active            Med Note (TOUSEY, LAINE M   Tue Dec 01, 2023 10:21 AM) 12/01/23: Reports during TOC 30-day program outreach:  she does not have this medication at her home/ reports "no recent low blood sugars"  Insulin  Lispro Prot & Lispro (HUMALOG  MIX 75/25 KWIKPEN) (75-25) 100 UNIT/ML [place] [id number] Yes Inject 20 Units into the skin 2 (two) times daily before a meal. [name], Julian Obey, MD Taking Active            Med Note (TOUSEY, LAINE M   Tue Dec 01, 2023 10:18 AM) 12/01/23- reports during [facility] 30-day program she is taking only as needed around her daily blood sugars- reports she takes "if my blood sugar goes up to 180 or above"  levothyroxine  (SYNTHROID ) 112 MCG tablet [id number] Yes Take 1 tablet (112 mcg total) by mouth daily. Sagardia, Miguel Jose, MD Taking Active   losartan  (COZAAR ) 50 MG tablet [id number] Yes Take 50 mg by mouth daily. [provider] Taking Active Spouse/Significant Other  metoprolol  succinate (TOPROL -XL) 100 MG 24 hr tablet [id number] Yes Take 100 mg by mouth 2 (two) times daily. [provider] Taking Active   pantoprazole  (PROTONIX ) 40 MG tablet [id number] No Take by mouth.  Patient not taking: Reported on 02/25/2024   [provider] Not Taking Active     Discontinued 03/06/22 1303   rosuvastatin  (CRESTOR ) 10 MG tablet [id number] Yes TAKE 1 TABLET BY MOUTH IN THE  EVENING Sagardia, Isidro Margo, MD Taking Active   sertraline  (ZOLOFT ) 25 MG tablet [id number] No Take 1 tablet (25 mg total) by mouth daily.  Patient not taking: Reported on 02/25/2024   Elvira Hammersmith, MD Not Taking  Active   spironolactone  (ALDACTONE ) 25 MG tablet [id number] Yes Take 25 mg by mouth daily. Elvira Hammersmith, MD Taking Active Self           Med Note Lajuana Pilar, Jacky Massing   Thu Feb 25, 2024  1:25 PM) Reports takes 1/2 tablet daily for three weeks per cardiology(ends on 02/26/24). Has lab work scheduled for 02/26/24.    torsemide  (DEMADEX ) 20 MG tablet [id number] Yes Take 1 tablet (20 mg total) by mouth daily. Please keep scheduled appointment for future refills. Thank you. Verona Goodwill, MD Taking Active            Med Note Errol Heaps, LAINE M  Mon Jan 04, 2024 11:25 AM) 01/04/24: confirmed during TOC call-- she is now taking 60 mg QD, since hospital discharge on 01/03/24              Per review of chart, apixaban  noted on medication list. per spouse he and cardiologist went over medications that patient is to take and he is only giving patient the medications instructed by cardiologist for patient to take. RNCM will notify cardiologist and request clarification and request to contact patient's husband with clarification and/or need for prescription as spouse states, he has gotten rid of all the medications that patient was not to take to avoid confusion.   Recommendation:   PCP Follow-up scheduled for 04/07/24 Follow up with cardiology as scheduled 03/24/24 Endocrinology follow up scheduled for 04/11/24 [facility] called Cardiology office([phone]) spoke with Jeni Mitten, Patient Coordinator services who reports Dr. Peggie Bowen nurse will return call to [facility].   Follow Up Plan:   Telephone follow-up next week  Lindi Revering, RN, MSN, BSN, CCM [place]  [facility], Population Health Case Manager Phone: [phone]

## 2024-02-25 NOTE — Patient Instructions (Addendum)
 Visit Information  Thank you for taking time to visit with me today. Please don't hesitate to contact me if I can be of assistance to you before our next scheduled appointment.  Our next appointment is by telephone on 03/03/24 at 1:00 pm Please call the care guide team at [phone] if you need to cancel or reschedule your appointment.   Following is a copy of your care plan:   Goals Addressed             This Visit's Progress    VBCI RN Care Plan       Problems:  Chronic Disease Management support and education needs related to Atrial Fibrillation, CHF, and DMII  Goal: Over the next 90 days the Parent will demonstrate Ongoing adherence to prescribed treatment plan for Atrial Fibrillation, CHF, and DMII as evidenced by patient report or review of chart take all medications exactly as prescribed and will call provider for medication related questions as evidenced by patient report or review of chart    verbalize basic understanding of Atrial Fibrillation, CHF, and DMII disease process and self health management plan as evidenced by patient report and review of chart   Interventions:   AFIB Interventions:   Counseled on increased risk of stroke due to Afib and benefits of anticoagulation for stroke prevention Counseled on importance of regular laboratory monitoring as prescribed Screening for signs and symptoms of depression related to chronic disease state  RNCM outreached to cardiologist to clarify Eliquis  dosing  CHF Interventions: Discussed signs/symptoms of HF exacerbation Encouraged to take medications as prescribed  DM interventions: Encouraged to follow up with provider,as scheduled Encouraged to take medications as prescribed Encouraged to check blood sugar and notify provider if outside recommended ranged  Patient Self-Care Activities:  Attend all scheduled provider appointments Call pharmacy for medication refills 3-7 days in advance of running out of  medications Call provider office for new concerns or questions  Take medications as prescribed   track symptoms and what helps feel better or worse take medicine as prescribed  Plan:  Telephone follow up appointment with care management team member scheduled for:  03/03/24 at 1:00 pm             Please call the Suicide and Crisis Lifeline: 988 call the [place]  National Suicide Prevention Lifeline: 567 742 7586 or TTY: (361)045-2604 TTY (413)170-4797) to talk to a trained counselor if you are experiencing a Mental Health or Behavioral Health Crisis or need someone to talk to.  Patient verbalizes understanding of instructions and care plan provided today and agrees to view in MyChart. Active MyChart status and patient understanding of how to access instructions and care plan via MyChart confirmed with patient.     Lindi Revering, RN, MSN, BSN, CCM [place] [employer]  [facility], Population Health Case Manager Phone: [phone]

## 2024-02-26 DIAGNOSIS — N184 Chronic kidney disease, stage 4 (severe): Secondary | ICD-10-CM | POA: Diagnosis not present

## 2024-02-26 DIAGNOSIS — E875 Hyperkalemia: Secondary | ICD-10-CM | POA: Diagnosis not present

## 2024-02-26 NOTE — Patient Outreach (Signed)
 Complex Care Management   Visit Note  02/26/2024  Name:  Chester Floriano MRN: [id number] DOB: 1941-02-23  Situation: No return call from cardiology office: [facility] sent message to Primary care provider to notify of medication discrepancy and pulse oximetry reading. RNCM also called cardiologist office. Spoke with Frnacis and left message regarding medication discrepancy and patient pulse oximetry reading. RNCM requested provider staff to contact patient's husband to clarify medication discrepancy.   Follow Up Plan:   RNCM will follow up as previously scheduled.  Lindi Revering, RN, MSN, BSN, CCM [facility], Population Health Case Manager Phone: [phone]    02/26/24 Update: RNCM received return message from [place] with [facility] [phone]). Per Joanne they have not discontinued the Eliquis  and patient needs to continue to take (unless discontinued by another provider). Mia Adam states she will call to speak with patient's husband to instruct that patient needs to continue to take Eliquis  from their standpoint.

## 2024-03-03 ENCOUNTER — Ambulatory Visit: Payer: Self-pay

## 2024-03-03 DIAGNOSIS — E1159 Type 2 diabetes mellitus with other circulatory complications: Secondary | ICD-10-CM | POA: Diagnosis not present

## 2024-03-03 DIAGNOSIS — I4821 Permanent atrial fibrillation: Secondary | ICD-10-CM | POA: Diagnosis not present

## 2024-03-03 DIAGNOSIS — I130 Hypertensive heart and chronic kidney disease with heart failure and stage 1 through stage 4 chronic kidney disease, or unspecified chronic kidney disease: Secondary | ICD-10-CM | POA: Diagnosis not present

## 2024-03-03 DIAGNOSIS — Z7189 Other specified counseling: Secondary | ICD-10-CM | POA: Diagnosis not present

## 2024-03-03 DIAGNOSIS — N1832 Chronic kidney disease, stage 3b: Secondary | ICD-10-CM | POA: Diagnosis not present

## 2024-03-03 NOTE — Patient Outreach (Signed)
 Complex Care Management   Visit Note  03/03/2024  Name:  [name] MRN: [id number] DOB: 1941/08/26  Situation: RNCM called to follow up with patient/spouse. No answer. Per review of chart patient is now active with [facility]. RNCM spoke with Magdaline Schools who confirms that patient is active with the home based primary provider program. RNCM will close out of case.  Lindi Revering, RN, MSN, BSN, CCM [place]  [facility], Population Health Case Manager Phone: [phone]

## 2024-03-04 DIAGNOSIS — I5032 Chronic diastolic (congestive) heart failure: Secondary | ICD-10-CM | POA: Diagnosis not present

## 2024-03-04 DIAGNOSIS — N184 Chronic kidney disease, stage 4 (severe): Secondary | ICD-10-CM | POA: Diagnosis not present

## 2024-03-07 DIAGNOSIS — E278 Other specified disorders of adrenal gland: Secondary | ICD-10-CM | POA: Diagnosis not present

## 2024-03-07 DIAGNOSIS — E871 Hypo-osmolality and hyponatremia: Secondary | ICD-10-CM | POA: Diagnosis not present

## 2024-03-07 DIAGNOSIS — E1122 Type 2 diabetes mellitus with diabetic chronic kidney disease: Secondary | ICD-10-CM | POA: Diagnosis not present

## 2024-03-07 DIAGNOSIS — N390 Urinary tract infection, site not specified: Secondary | ICD-10-CM | POA: Diagnosis not present

## 2024-03-07 DIAGNOSIS — I509 Heart failure, unspecified: Secondary | ICD-10-CM | POA: Diagnosis not present

## 2024-03-07 DIAGNOSIS — Z6841 Body Mass Index (BMI) 40.0 and over, adult: Secondary | ICD-10-CM | POA: Diagnosis not present

## 2024-03-07 DIAGNOSIS — R109 Unspecified abdominal pain: Secondary | ICD-10-CM | POA: Diagnosis not present

## 2024-03-07 DIAGNOSIS — I482 Chronic atrial fibrillation, unspecified: Secondary | ICD-10-CM | POA: Diagnosis not present

## 2024-03-07 DIAGNOSIS — N179 Acute kidney failure, unspecified: Secondary | ICD-10-CM | POA: Diagnosis not present

## 2024-03-07 DIAGNOSIS — D631 Anemia in chronic kidney disease: Secondary | ICD-10-CM | POA: Diagnosis not present

## 2024-03-07 DIAGNOSIS — I130 Hypertensive heart and chronic kidney disease with heart failure and stage 1 through stage 4 chronic kidney disease, or unspecified chronic kidney disease: Secondary | ICD-10-CM | POA: Diagnosis not present

## 2024-03-08 DIAGNOSIS — N179 Acute kidney failure, unspecified: Secondary | ICD-10-CM | POA: Diagnosis not present

## 2024-03-09 DIAGNOSIS — N179 Acute kidney failure, unspecified: Secondary | ICD-10-CM | POA: Diagnosis not present

## 2024-03-11 ENCOUNTER — Telehealth: Payer: Self-pay | Admitting: Emergency Medicine

## 2024-03-11 NOTE — Telephone Encounter (Signed)
 Copied from CRM [id number]. Topic: Clinical - Medication Question >> Mar 11, 2024 12:35 PM Alyse July wrote: Reason for CRM: Patient was recently discharged from the hospital and was prescribed a potassium pill(name unknown per patient). Patient would like to know if a less expensive alternative can be called into her local pharmacy. Patient has advised to take medication for the next 30 days. Please contact patient to advise. Contact number confirmed: [phone]

## 2024-03-16 ENCOUNTER — Inpatient Hospital Stay: Admitting: Emergency Medicine

## 2024-03-22 ENCOUNTER — Other Ambulatory Visit: Payer: Self-pay | Admitting: Emergency Medicine

## 2024-03-22 DIAGNOSIS — M109 Gout, unspecified: Secondary | ICD-10-CM

## 2024-03-22 DIAGNOSIS — E1142 Type 2 diabetes mellitus with diabetic polyneuropathy: Secondary | ICD-10-CM

## 2024-03-22 NOTE — Telephone Encounter (Unsigned)
 Copied from CRM [id number]. Topic: Clinical - Medication Refill >> Mar 22, 2024  8:44 AM Juleen Oakland F wrote: Medication:  rosuvastatin  (CRESTOR ) 10 MG tablet allopurinol  (ZYLOPRIM ) 300 MG tablet BD PEN NEEDLE NANO 2ND GEN 32G X 4 MM MISC  Has the patient contacted their pharmacy? Yes (Agent: If no, request that the patient contact the pharmacy for the refill. If patient does not wish to contact the pharmacy document the reason why and proceed with request.) (Agent: If yes, when and what did the pharmacy advise?)  This is the patient's preferred pharmacy:   [facility] [place], [place] - [address] [place] [address] Phone: [phone] Fax: [phone]   Is this the correct pharmacy for this prescription? Yes If no, delete pharmacy and type the correct one.   Has the prescription been filled recently? Yes  Is the patient out of the medication? Yes  Has the patient been seen for an appointment in the last year OR does the patient have an upcoming appointment? Yes  Can we respond through MyChart? No  Agent: Please be advised that Rx refills may take up to 3 business days. We ask that you follow-up with your pharmacy.

## 2024-03-23 DIAGNOSIS — E1165 Type 2 diabetes mellitus with hyperglycemia: Secondary | ICD-10-CM | POA: Diagnosis not present

## 2024-03-23 DIAGNOSIS — Z794 Long term (current) use of insulin: Secondary | ICD-10-CM | POA: Diagnosis not present

## 2024-03-24 DIAGNOSIS — M1A9XX0 Chronic gout, unspecified, without tophus (tophi): Secondary | ICD-10-CM | POA: Diagnosis not present

## 2024-03-24 DIAGNOSIS — I5032 Chronic diastolic (congestive) heart failure: Secondary | ICD-10-CM | POA: Diagnosis not present

## 2024-03-24 DIAGNOSIS — I4811 Longstanding persistent atrial fibrillation: Secondary | ICD-10-CM | POA: Diagnosis not present

## 2024-03-24 DIAGNOSIS — N184 Chronic kidney disease, stage 4 (severe): Secondary | ICD-10-CM | POA: Diagnosis not present

## 2024-03-24 DIAGNOSIS — Z515 Encounter for palliative care: Secondary | ICD-10-CM | POA: Diagnosis not present

## 2024-03-24 DIAGNOSIS — E875 Hyperkalemia: Secondary | ICD-10-CM | POA: Diagnosis not present

## 2024-03-24 DIAGNOSIS — Z7901 Long term (current) use of anticoagulants: Secondary | ICD-10-CM | POA: Diagnosis not present

## 2024-03-24 DIAGNOSIS — I10 Essential (primary) hypertension: Secondary | ICD-10-CM | POA: Diagnosis not present

## 2024-03-24 DIAGNOSIS — E785 Hyperlipidemia, unspecified: Secondary | ICD-10-CM | POA: Diagnosis not present

## 2024-03-25 MED ORDER — ALLOPURINOL 300 MG PO TABS: 150.0000 mg | ORAL_TABLET | Freq: Every day | ORAL | 3 refills | Status: AC

## 2024-03-25 MED ORDER — ROSUVASTATIN CALCIUM 10 MG PO TABS: 10.0000 mg | ORAL_TABLET | Freq: Every evening | ORAL | 2 refills | Status: DC

## 2024-03-25 MED ORDER — BD PEN NEEDLE NANO 2ND GEN 32G X 4 MM MISC: 11 refills | Status: AC

## 2024-04-05 DIAGNOSIS — N179 Acute kidney failure, unspecified: Secondary | ICD-10-CM | POA: Diagnosis not present

## 2024-04-05 DIAGNOSIS — I4811 Longstanding persistent atrial fibrillation: Secondary | ICD-10-CM | POA: Diagnosis not present

## 2024-04-07 ENCOUNTER — Ambulatory Visit: Admitting: Emergency Medicine

## 2024-04-07 ENCOUNTER — Encounter: Payer: Self-pay | Admitting: Emergency Medicine

## 2024-04-07 VITALS — BP 128/74 | HR 125 | Temp 98.6°F | Ht 61.0 in | Wt 231.0 lb

## 2024-04-07 DIAGNOSIS — E1169 Type 2 diabetes mellitus with other specified complication: Secondary | ICD-10-CM | POA: Diagnosis not present

## 2024-04-07 DIAGNOSIS — K219 Gastro-esophageal reflux disease without esophagitis: Secondary | ICD-10-CM

## 2024-04-07 DIAGNOSIS — I5042 Chronic combined systolic (congestive) and diastolic (congestive) heart failure: Secondary | ICD-10-CM | POA: Diagnosis not present

## 2024-04-07 DIAGNOSIS — E1159 Type 2 diabetes mellitus with other circulatory complications: Secondary | ICD-10-CM | POA: Diagnosis not present

## 2024-04-07 DIAGNOSIS — J984 Other disorders of lung: Secondary | ICD-10-CM

## 2024-04-07 DIAGNOSIS — E039 Hypothyroidism, unspecified: Secondary | ICD-10-CM | POA: Diagnosis not present

## 2024-04-07 DIAGNOSIS — I480 Paroxysmal atrial fibrillation: Secondary | ICD-10-CM | POA: Diagnosis not present

## 2024-04-07 DIAGNOSIS — I2510 Atherosclerotic heart disease of native coronary artery without angina pectoris: Secondary | ICD-10-CM

## 2024-04-07 DIAGNOSIS — I700 Atherosclerosis of aorta: Secondary | ICD-10-CM | POA: Diagnosis not present

## 2024-04-07 DIAGNOSIS — E785 Hyperlipidemia, unspecified: Secondary | ICD-10-CM

## 2024-04-07 DIAGNOSIS — N1832 Chronic kidney disease, stage 3b: Secondary | ICD-10-CM

## 2024-04-07 DIAGNOSIS — I152 Hypertension secondary to endocrine disorders: Secondary | ICD-10-CM

## 2024-04-07 DIAGNOSIS — G4733 Obstructive sleep apnea (adult) (pediatric): Secondary | ICD-10-CM

## 2024-04-07 NOTE — Assessment & Plan Note (Signed)
Stable.  Continues on CPAP treatment.

## 2024-04-07 NOTE — Assessment & Plan Note (Signed)
Clinically stable Continues levothyroxine 112 mcg daily

## 2024-04-07 NOTE — Assessment & Plan Note (Addendum)
 Well-controlled hypertension Continues metoprolol  succinate 100 mg and Cardizem  CD 180 mg daily.  No longer taking losartan  or amlodipine . Taking Humalog  75/25 20 units twice a day Good glucose readings at home Sees endocrinologist on a regular basis

## 2024-04-07 NOTE — Assessment & Plan Note (Signed)
 No signs of acute CHF. No peripheral edema Continues Demadex 20 mg daily Hypertension well-controlled Continues metoprolol succinate 100 mg daily and Cardizem CD1 180 mg daily

## 2024-04-07 NOTE — Assessment & Plan Note (Signed)
 Continue rosuvastatin 10 mg daily Insulin 20 units twice a day

## 2024-04-07 NOTE — Assessment & Plan Note (Signed)
No recent anginal episodes Stable chronic condition

## 2024-04-07 NOTE — Assessment & Plan Note (Signed)
 Stable. Asymptomatic.

## 2024-04-07 NOTE — Patient Instructions (Signed)
 Health Maintenance After Age 81 After age 4, you are at a higher risk for certain long-term diseases and infections as well as injuries from falls. Falls are a major cause of broken bones and head injuries in people who are older than age 47. Getting regular preventive care can help to keep you healthy and well. Preventive care includes getting regular testing and making lifestyle changes as recommended by your health care provider. Talk with your health care provider about: Which screenings and tests you should have. A screening is a test that checks for a disease when you have no symptoms. A diet and exercise plan that is right for you. What should I know about screenings and tests to prevent falls? Screening and testing are the best ways to find a health problem early. Early diagnosis and treatment give you the best chance of managing medical conditions that are common after age 37. Certain conditions and lifestyle choices may make you more likely to have a fall. Your health care provider may recommend: Regular vision checks. Poor vision and conditions such as cataracts can make you more likely to have a fall. If you wear glasses, make sure to get your prescription updated if your vision changes. Medicine review. Work with your health care provider to regularly review all of the medicines you are taking, including over-the-counter medicines. Ask your health care provider about any side effects that may make you more likely to have a fall. Tell your health care provider if any medicines that you take make you feel dizzy or sleepy. Strength and balance checks. Your health care provider may recommend certain tests to check your strength and balance while standing, walking, or changing positions. Foot health exam. Foot pain and numbness, as well as not wearing proper footwear, can make you more likely to have a fall. Screenings, including: Osteoporosis screening. Osteoporosis is a condition that causes  the bones to get weaker and break more easily. Blood pressure screening. Blood pressure changes and medicines to control blood pressure can make you feel dizzy. Depression screening. You may be more likely to have a fall if you have a fear of falling, feel depressed, or feel unable to do activities that you used to do. Alcohol use screening. Using too much alcohol can affect your balance and may make you more likely to have a fall. Follow these instructions at home: Lifestyle Do not drink alcohol if: Your health care provider tells you not to drink. If you drink alcohol: Limit how much you have to: 0-1 drink a day for women. 0-2 drinks a day for men. Know how much alcohol is in your drink. In the [place] one drink equals one 12 oz bottle of beer (355 mL), one 5 oz glass of wine (148 mL), or one 1 oz glass of hard liquor (44 mL). Do not use any products that contain nicotine or tobacco. These products include cigarettes, chewing tobacco, and vaping devices, such as e-cigarettes. If you need help quitting, ask your health care provider. Activity  Follow a regular exercise program to stay fit. This will help you maintain your balance. Ask your health care provider what types of exercise are appropriate for you. If you need a cane or walker, use it as recommended by your health care provider. Wear supportive shoes that have nonskid soles. Safety  Remove any tripping hazards, such as rugs, cords, and clutter. Install safety equipment such as grab bars in bathrooms and safety rails on stairs. Keep rooms and walkways  well-lit. General instructions Talk with your health care provider about your risks for falling. Tell your health care provider if: You fall. Be sure to tell your health care provider about all falls, even ones that seem minor. You feel dizzy, tiredness (fatigue), or off-balance. Take over-the-counter and prescription medicines only as told by your health care provider. These include  supplements. Eat a healthy diet and maintain a healthy weight. A healthy diet includes low-fat dairy products, low-fat (lean) meats, and fiber from whole grains, beans, and lots of fruits and vegetables. Stay current with your vaccines. Schedule regular health, dental, and eye exams. Summary Having a healthy lifestyle and getting preventive care can help to protect your health and wellness after age 11. Screening and testing are the best way to find a health problem early and help you avoid having a fall. Early diagnosis and treatment give you the best chance for managing medical conditions that are more common for people who are older than age 28. Falls are a major cause of broken bones and head injuries in people who are older than age 48. Take precautions to prevent a fall at home. Work with your health care provider to learn what changes you can make to improve your health and wellness and to prevent falls. This information is not intended to replace advice given to you by your health care provider. Make sure you discuss any questions you have with your health care provider. Document Revised: 02/25/2021 Document Reviewed: 02/25/2021 Elsevier Patient Education  2024 [employer].

## 2024-04-07 NOTE — Assessment & Plan Note (Addendum)
 Atrial fibrillation with good ventricular response on physical exam today Continues Eliquis  2.5 mg twice a day No longer taking amiodarone 

## 2024-04-07 NOTE — Progress Notes (Signed)
 [name] 83 y.o.   Chief Complaint  Patient presents with   Follow-up    3 month f/u. No other concerns     HISTORY OF PRESENT ILLNESS: This is a 83 y.o. female here for follow-up of chronic medical conditions.  Accompanied by husband. Since her last visit she was hospitalized last May with UTI and acute kidney injury on top of chronic kidney disease Recovered well.  No other concerns. No other complaints or medical concerns today.  Recent hospital discharge assessment and plan as follows: Hospital Course:   83 yo F treated for AKI, e.coli UTI and hyperK+. Pt received IV NS, IV ceftriaxone  1 g daily. Lokelma was also used 10 g PO tid to treat the hyperkalemia. HypoNa+ improved with IV fluids. BUN/Cr on arrival was 74/2.28 and it improved to 45/1.68 (which is the pt's baseline). Urine cx from 03/07/2024 grew e.coli. With lokelma and the improvement in the pt's kidney function the K+ improved from 5.5 --> 5.3. CRP improved from 72 -->51.   Pt will go home with renally adjusted augmentin  and a script for PO lokelma. Amb referral to nephrology was also made at discharge as the pt does not see a nephrologist for her CKD. Upon discharge the pt's losartan , aldactone  and torsemide  were discontinued (due to her current kidney function). Pt should follow up with PCP and nephrology regarding these medications (either titration/dose adjustment or removal).  UPDATE: Urine cx was showing resistance to the augmentin . Thus, I called the pt's pharmacy and sent a verbal script for PO macrobid (which is sensitive based on the urine C&S). Pt was called and updated.  HPI   Prior to Admission medications   Medication Sig Start Date End Date Taking? Authorizing Provider  Accu-Chek Softclix Lancets lancets 1 each by Other route 3 (three) times daily. as directed 03/30/20  Yes Ulyess Gammons, NP  allopurinol  (ZYLOPRIM ) 300 MG tablet Take 0.5 tablets (150 mg total) by mouth daily. TAKE 1/2 tablet daily  03/25/24  Yes Lene Mckay, Isidro Margo, MD  apixaban  (ELIQUIS ) 2.5 MG TABS tablet Take 2.5 mg by mouth 2 (two) times daily. 02/26/24  Yes [provider]  Continuous Blood Gluc Receiver (FREESTYLE LIBRE 2 READER) DEVI Use to check blood glucose. Use as directed 01/08/23  Yes Keni Wafer, Isidro Margo, MD  Continuous Glucose Sensor (FREESTYLE LIBRE 2 SENSOR) MISC USE AS DIRECTED TO CHECK BLOOD  SUGAR DAILY 08/05/23  Yes [name], [name], MD  diltiazem  (CARDIZEM  CD) 180 MG 24 hr capsule Take 1 capsule (180 mg total) by mouth daily. 12/29/23  Yes Zaniah Titterington, Isidro Margo, MD  Insulin  Lispro Prot & Lispro (HUMALOG  MIX 75/25 KWIKPEN) (75-25) 100 UNIT/ML Kwikpen Inject 20 Units into the skin 2 (two) times daily before a meal. 07/07/23  Yes [name], Julian Obey, MD  Insulin  Pen Needle (BD PEN NEEDLE NANO 2ND GEN) 32G X 4 MM MISC USE TWICE DAILY AS NEEDED 03/25/24  Yes Asif Muchow, Isidro Margo, MD  levothyroxine  (SYNTHROID ) 112 MCG tablet Take 1 tablet (112 mcg total) by mouth daily. 11/16/23  Yes Ileana Chalupa, Isidro Margo, MD  metoprolol  succinate (TOPROL -XL) 100 MG 24 hr tablet Take 100 mg by mouth 2 (two) times daily.   Yes [provider]  rosuvastatin  (CRESTOR ) 10 MG tablet Take 1 tablet (10 mg total) by mouth every evening. 03/25/24  Yes Jeramine Delis, Isidro Margo, MD  torsemide  (DEMADEX ) 20 MG tablet Take 1 tablet (20 mg total) by mouth daily. Please keep scheduled appointment for future refills. Thank you. 08/07/23  Yes Verona Goodwill, MD  amiodarone  (PACERONE ) 400 MG tablet 400 mg daily. Patient not taking: Reported on 04/07/2024 11/21/23   [provider]  blood glucose meter kit and supplies Dispense based on patient and insurance preference. Use up to four times daily as directed. (FOR ICD-10 E10.9, E11.9). Patient not taking: Reported on 04/07/2024 08/29/21   Elvira Hammersmith, MD  cephALEXin  (KEFLEX ) 250 MG capsule Take 250 mg by mouth daily. 01/04/24: confirmed during TOC call-- she  has been taking QD, since one of the hospital visits; reports she has no idea why this medication was prescribed- confirmed prescribed by [facility] hospitalist, per patient report Patient not taking: Reported on 04/07/2024    Mayleen Borrero Jose, MD  diltiazem  (TIAZAC ) 180 MG 24 hr capsule Take 180 mg by mouth daily. Patient not taking: Reported on 04/07/2024 11/20/23   [provider]  FEROSUL 325 (65 Fe) MG tablet Take 325 mg by mouth every other day. Patient not taking: Reported on 04/07/2024 10/22/23   [provider]  fluconazole (DIFLUCAN) 100 MG tablet Take by mouth. Patient not taking: Reported on 04/07/2024 12/08/23   [provider]  Glucagon  (GVOKE HYPOPEN  2-PACK) 0.5 MG/0.1ML SOAJ Inject 0.5 mg into the skin daily as needed. For Hypoglycemic events Patient not taking: Reported on 04/07/2024 05/30/22   Elvira Hammersmith, MD  losartan  (COZAAR ) 50 MG tablet Take 50 mg by mouth daily. Patient not taking: Reported on 04/07/2024    [provider]  pantoprazole  (PROTONIX ) 40 MG tablet Take by mouth. Patient not taking: Reported on 04/07/2024    [provider]  sertraline  (ZOLOFT ) 25 MG tablet Take 1 tablet (25 mg total) by mouth daily. Patient not taking: Reported on 04/07/2024 12/24/23   Yazmine Sorey Jose, MD  spironolactone  (ALDACTONE ) 25 MG tablet Take 25 mg by mouth daily. Patient not taking: Reported on 04/07/2024    Elvira Hammersmith, MD  potassium chloride  (KLOR-CON ) 10 MEQ tablet Take 4 tablets (40 mEq total) by mouth daily. 12/10/21 03/06/22  Tylene Galla, PA-C    Allergies  Allergen Reactions   Doxycycline      Made patient feel very ill    Metformin  Diarrhea    Patient Active Problem List   Diagnosis Date Noted   Recent urinary tract infection 11/24/2023   Right foot pain 07/28/2023   Hyperkalemia, diminished renal excretion 05/27/2023   Allergic rhinitis 01/15/2023   Stage 3b chronic kidney disease (HCC)  01/07/2023   Primary hyperaldosteronism (HCC) 03/06/2022   Coronary artery disease involving native coronary artery of native heart without angina pectoris 02/12/2022   Adrenal adenoma, right 02/07/2022   Skin lesion of right arm 02/02/2022   Asthma 11/01/2021   Normocytic anemia 10/11/2021   Nodule of right lung 09/30/2021   Dysthymia 03/12/2021   Primary osteoarthritis involving multiple joints 03/01/2021   Recurrent falls 01/10/2021   Sensorineural hearing loss (SNHL), 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   Persistent atrial fibrillation (HCC) 08/12/2020   Type 2 diabetes mellitus with hyperglycemia, with long-term current use of insulin  (HCC) 06/21/2020   Dyslipidemia associated with type 2 diabetes mellitus (HCC) 06/21/2020   Mixed hyperlipidemia 06/21/2020   Uncontrolled hypertension 06/11/2020   Aortic atherosclerosis (HCC) 03/28/2020   Diabetic peripheral neuropathy (HCC) 12/26/2019   Gastroesophageal reflux disease without esophagitis 03/17/2019   Varicose veins of both legs with edema 02/16/2019   Heart murmur 09/05/2018   Restrictive lung disease 02/22/2018   Dysgeusia 06/11/2017   Osteoarthritis of left knee 06/03/2017  NAFLD (nonalcoholic fatty liver disease) 59/56/3875   Osteoporosis 12/31/2015   Obesity 12/19/2015   Acquired hypothyroidism 10/18/2015   Prolonged QT interval 10/03/2013   CHF (congestive heart failure) (HCC) 04/21/2013   Long term (current) use of anticoagulants 06/09/2012   BENIGN NEOPLASM OF ADRENAL GLAND 11/25/2010   Chronic diastolic heart failure (HCC) 02/20/2010   Diabetes (HCC) 05/16/2009   GOUT 05/16/2009   OBESITY, MORBID 05/16/2009   Hypertension associated with diabetes (HCC) 05/16/2009   Paroxysmal atrial fibrillation (HCC) 05/16/2009   HYPERLIPIDEMIA 11/30/2008   Obstructive sleep apnea on CPAP 11/30/2008    Past Medical History:  Diagnosis Date   Arthritis    Back pain    BCC (basal cell carcinoma) 04/01/2023   right lower chin, Mohs  completed on 04/21/23   Chronic anticoagulation    due to aflutter   Chronic kidney disease    Diabetes mellitus    Diastolic CHF, chronic (HCC)    a.  echo 2006 - ef 55-65%; mild diast dysfxn;    b. Echo 08/2011: Mild LVH, EF 60%;  c. 04/2013 Echo: EF 65-69%, mild conc LVH;  08/2014 Echo: EF 60-65%, mild-mod MR.   Gout    Hyperlipidemia    Hypertension    a.  Renal arterial Dopplers 12/2011: 1-59% right renal artery stenosis   Morbid obesity (HCC)    Obstructive sleep apnea on CPAP    Paroxysmal Afib/Flutter    a. dccv: 08/2011 - on amiodarone /coumadin     Past Surgical History:  Procedure Laterality Date   APPENDECTOMY     ATRIAL FLUTTER ABLATION N/A 09/24/2011   Procedure: ATRIAL FLUTTER ABLATION;  Surgeon: Tammie Fall, MD;  Location: [facility] CATH LAB;  Service: Cardiovascular;  Laterality: N/A;   CARDIOVERSION  10/22/2011   Procedure: CARDIOVERSION;  Surgeon: Verona Goodwill, MD;  Location: [facility] OR;  Service: Cardiovascular;  Laterality: N/A;   CARDIOVERSION N/A 09/10/2011   Procedure: CARDIOVERSION;  Surgeon: Verona Goodwill, MD;  Location: [facility] CATH LAB;  Service: Cardiovascular;  Laterality: N/A;   CHOLECYSTECTOMY     COLONOSCOPY WITH PROPOFOL  N/A 06/13/2021   Procedure: COLONOSCOPY WITH PROPOFOL ;  Surgeon: Janel Medford, MD;  Location: [facility] ENDOSCOPY;  Service: Endoscopy;  Laterality: N/A;   POLYPECTOMY  06/13/2021   Procedure: POLYPECTOMY;  Surgeon: Janel Medford, MD;  Location: [facility] ENDOSCOPY;  Service: Endoscopy;;   TONSILLECTOMY  1982   TOTAL ABDOMINAL HYSTERECTOMY      Social History   Socioeconomic History   Marital status: Married    Spouse name: Not on file   Number of children: 3   Years of education: Not on file   Highest education level: Not on file  Occupational History   Occupation: DISABILITY/housewife    Employer: RETIRED  Tobacco Use   Smoking status: Never   Smokeless tobacco: Never  Vaping Use   Vaping status: Never Used  Substance and Sexual Activity    Alcohol use: No   Drug use: No   Sexual activity: Yes  Other Topics Concern   Not on file  Social History Narrative   Lives with husband.   Social Drivers of Corporate investment banker Strain: Low Risk  (12/07/2023)   Received from [facility]   Overall Financial Resource Strain (CARDIA)    Difficulty of Paying Living Expenses: Not hard at all  Recent Concern: Financial Resource Strain - High Risk (11/23/2023)   Overall Financial Resource Strain (CARDIA)    Difficulty of Paying Living Expenses: Very hard  Food Insecurity: No Food Insecurity (03/07/2024)   Received from [facility]   Hunger Vital Sign    Within the past 12 months, you worried that your food would run out before you got the money to buy more.: Never true    Within the past 12 months, the food you bought just didn't last and you didn't have money to get more.: Never true  Recent Concern: Food Insecurity - Food Insecurity Present (01/14/2024)   Hunger Vital Sign    Worried About Running Out of Food in the Last Year: Sometimes true    Ran Out of Food in the Last Year: Sometimes true  Transportation Needs: No Transportation Needs (03/08/2024)   Received from [facility]   PRAPARE - Transportation    Lack of Transportation (Medical): No    Lack of Transportation (Non-Medical): No  Physical Activity: Inactive (11/23/2023)   Exercise Vital Sign    Days of Exercise per Week: 0 days    Minutes of Exercise per Session: 0 min  Stress: No Stress Concern Present (03/07/2024)   Received from [facility] - Occupational Stress Questionnaire    Feeling of Stress : Only a little  Social Connections: Socially Integrated (11/23/2023)   Social Connection and Isolation Panel    Frequency of Communication with Friends and Family: More than three times a week    Frequency of Social Gatherings with Friends and Family: More than three times a week    Attends Religious Services: More than 4  times per year    Active Member of [employer] or Organizations: Yes    Attends Banker Meetings: Never    Marital Status: Married  Catering manager Violence: Not At Risk (03/07/2024)   Received from [facility]   HITS    Over the last 12 months how often did your partner physically hurt you?: Never    Over the last 12 months how often did your partner insult you or talk down to you?: Never    Over the last 12 months how often did your partner threaten you with physical harm?: Never    Over the last 12 months how often did your partner scream or curse at you?: Never    Family History  Problem Relation Age of Onset   Heart disease Father    Hypertension Father    Breast cancer Sister    Cancer Sister        breast   Colon cancer Neg Hx    Esophageal cancer Neg Hx    Pancreatic cancer Neg Hx    Liver disease Neg Hx      Review of Systems  Constitutional: Negative.  Negative for chills and fever.  HENT: Negative.  Negative for congestion and sore throat.   Respiratory: Negative.  Negative for cough and shortness of breath.   Cardiovascular: Negative.  Negative for chest pain and palpitations.  Gastrointestinal:  Negative for abdominal pain, diarrhea, nausea and vomiting.  Genitourinary: Negative.  Negative for dysuria and hematuria.  Skin: Negative.  Negative for rash.  Neurological: Negative.  Negative for dizziness and headaches.  All other systems reviewed and are negative.   Vitals:   04/07/24 1007  BP: 128/74  Pulse: (!) 125  Temp: 98.6 F (37 C)  SpO2: 94%    Physical Exam Vitals reviewed.  Constitutional:      Appearance: Normal appearance. She is obese.  HENT:     Head: Normocephalic.  Mouth/Throat:     Mouth: Mucous membranes are moist.     Pharynx: Oropharynx is clear.   Eyes:     Extraocular Movements: Extraocular movements intact.     Conjunctiva/sclera: Conjunctivae normal.     Pupils: Pupils are equal, round, and reactive to light.     Cardiovascular:     Rate and Rhythm: Normal rate and regular rhythm.     Pulses: Normal pulses.     Heart sounds: Normal heart sounds.  Pulmonary:     Effort: Pulmonary effort is normal.     Breath sounds: Normal breath sounds.   Musculoskeletal:     Cervical back: No tenderness.     Right lower leg: No edema.     Left lower leg: No edema.  Lymphadenopathy:     Cervical: No cervical adenopathy.   Skin:    General: Skin is warm and dry.     Capillary Refill: Capillary refill takes less than 2 seconds.   Neurological:     General: No focal deficit present.     Mental Status: She is alert and oriented to person, place, and time.   Psychiatric:        Mood and Affect: Mood normal.        Behavior: Behavior normal.       ASSESSMENT & PLAN: A total of 44 minutes was spent with the patient and counseling/coordination of care regarding preparing for this visit, review of most recent office visit notes, review of most recent hospital discharge summary, review of most recent cardiologist office visit notes, review of multiple chronic medical conditions and their management, review of all medications, review of most recent bloodwork results, review of health maintenance items, education on nutrition, prognosis, documentation, and need for follow up.   Problem List Items Addressed This Visit       Cardiovascular and Mediastinum   Hypertension associated with diabetes (HCC)   Well-controlled hypertension Continues metoprolol  succinate 100 mg and Cardizem  CD 180 mg daily.  No longer taking losartan  or amlodipine . Taking Humalog  75/25 20 units twice a day Good glucose readings at home Sees endocrinologist on a regular basis      Relevant Medications   apixaban  (ELIQUIS ) 2.5 MG TABS tablet   Paroxysmal atrial fibrillation (HCC)   Atrial fibrillation with good ventricular response on physical exam today Continues Eliquis  2.5 mg twice a day No longer taking amiodarone        Relevant Medications   apixaban  (ELIQUIS ) 2.5 MG TABS tablet   CHF (congestive heart failure) (HCC) - Primary   No signs of acute CHF. No peripheral edema Continues Demadex  20 mg daily Hypertension well-controlled Continues metoprolol  succinate 100 mg daily and Cardizem  CD1 180 mg daily      Relevant Medications   apixaban  (ELIQUIS ) 2.5 MG TABS tablet   Aortic atherosclerosis (HCC)   Chronic stable condition Diet and nutrition discussed Continue rosuvastatin  10 mg daily      Relevant Medications   apixaban  (ELIQUIS ) 2.5 MG TABS tablet   Coronary artery disease involving native coronary artery of native heart without angina pectoris   No recent anginal episodes Stable chronic condition      Relevant Medications   apixaban  (ELIQUIS ) 2.5 MG TABS tablet     Respiratory   Obstructive sleep apnea on CPAP   Stable. Continues on CPAP treatment.       Restrictive lung disease   Stable. Continue Breztri  2 puffs twice a day  Digestive   Gastroesophageal reflux disease without esophagitis   Stable. Asymptomatic         Endocrine   Dyslipidemia associated with type 2 diabetes mellitus (HCC)   Continue rosuvastatin  10 mg daily Insulin  20 units twice a day      Acquired hypothyroidism   Clinically stable Continues levothyroxine  112 mcg daily        Genitourinary   Stage 3b chronic kidney disease (HCC)   Continues to be monitored by nephrology service Advised to stay well-hydrated and avoid NSAIDs        Other   OBESITY, MORBID   Diet and nutrition discussed. Advised to decrease amount of daily carbohydrate intake and daily calories and increase amount of plant-based protein in her diet.      Patient Instructions  Health Maintenance After Age 75 After age 72, you are at a higher risk for certain long-term diseases and infections as well as injuries from falls. Falls are a major cause of broken bones and head injuries in people who are older than age 50.  Getting regular preventive care can help to keep you healthy and well. Preventive care includes getting regular testing and making lifestyle changes as recommended by your health care provider. Talk with your health care provider about: Which screenings and tests you should have. A screening is a test that checks for a disease when you have no symptoms. A diet and exercise plan that is right for you. What should I know about screenings and tests to prevent falls? Screening and testing are the best ways to find a health problem early. Early diagnosis and treatment give you the best chance of managing medical conditions that are common after age 66. Certain conditions and lifestyle choices may make you more likely to have a fall. Your health care provider may recommend: Regular vision checks. Poor vision and conditions such as cataracts can make you more likely to have a fall. If you wear glasses, make sure to get your prescription updated if your vision changes. Medicine review. Work with your health care provider to regularly review all of the medicines you are taking, including over-the-counter medicines. Ask your health care provider about any side effects that may make you more likely to have a fall. Tell your health care provider if any medicines that you take make you feel dizzy or sleepy. Strength and balance checks. Your health care provider may recommend certain tests to check your strength and balance while standing, walking, or changing positions. Foot health exam. Foot pain and numbness, as well as not wearing proper footwear, can make you more likely to have a fall. Screenings, including: Osteoporosis screening. Osteoporosis is a condition that causes the bones to get weaker and break more easily. Blood pressure screening. Blood pressure changes and medicines to control blood pressure can make you feel dizzy. Depression screening. You may be more likely to have a fall if you have a fear of  falling, feel depressed, or feel unable to do activities that you used to do. Alcohol use screening. Using too much alcohol can affect your balance and may make you more likely to have a fall. Follow these instructions at home: Lifestyle Do not drink alcohol if: Your health care provider tells you not to drink. If you drink alcohol: Limit how much you have to: 0-1 drink a day for women. 0-2 drinks a day for men. Know how much alcohol is in your drink. In the [place] one drink equals one 12  oz bottle of beer (355 mL), one 5 oz glass of wine (148 mL), or one 1 oz glass of hard liquor (44 mL). Do not use any products that contain nicotine or tobacco. These products include cigarettes, chewing tobacco, and vaping devices, such as e-cigarettes. If you need help quitting, ask your health care provider. Activity  Follow a regular exercise program to stay fit. This will help you maintain your balance. Ask your health care provider what types of exercise are appropriate for you. If you need a cane or walker, use it as recommended by your health care provider. Wear supportive shoes that have nonskid soles. Safety  Remove any tripping hazards, such as rugs, cords, and clutter. Install safety equipment such as grab bars in bathrooms and safety rails on stairs. Keep rooms and walkways well-lit. General instructions Talk with your health care provider about your risks for falling. Tell your health care provider if: You fall. Be sure to tell your health care provider about all falls, even ones that seem minor. You feel dizzy, tiredness (fatigue), or off-balance. Take over-the-counter and prescription medicines only as told by your health care provider. These include supplements. Eat a healthy diet and maintain a healthy weight. A healthy diet includes low-fat dairy products, low-fat (lean) meats, and fiber from whole grains, beans, and lots of fruits and vegetables. Stay current with your  vaccines. Schedule regular health, dental, and eye exams. Summary Having a healthy lifestyle and getting preventive care can help to protect your health and wellness after age 67. Screening and testing are the best way to find a health problem early and help you avoid having a fall. Early diagnosis and treatment give you the best chance for managing medical conditions that are more common for people who are older than age 66. Falls are a major cause of broken bones and head injuries in people who are older than age 72. Take precautions to prevent a fall at home. Work with your health care provider to learn what changes you can make to improve your health and wellness and to prevent falls. This information is not intended to replace advice given to you by your health care provider. Make sure you discuss any questions you have with your health care provider. Document Revised: 02/25/2021 Document Reviewed: 02/25/2021 Elsevier Patient Education  2024 Elsevier Inc.    Maryagnes Small, MD [facility]

## 2024-04-07 NOTE — Assessment & Plan Note (Signed)
Stable.  Continue Breztri 2 puffs twice a day. 

## 2024-04-07 NOTE — Assessment & Plan Note (Signed)
 Diet and nutrition discussed.  Advised to decrease amount of daily carbohydrate intake and daily calories and increase amount of plant-based protein in her diet.

## 2024-04-07 NOTE — Assessment & Plan Note (Signed)
Continues to be monitored by nephrology service Advised to stay well-hydrated and avoid NSAIDs

## 2024-04-07 NOTE — Assessment & Plan Note (Signed)
Chronic stable condition Diet and nutrition discussed Continue rosuvastatin 10 mg daily

## 2024-04-11 ENCOUNTER — Ambulatory Visit (INDEPENDENT_AMBULATORY_CARE_PROVIDER_SITE_OTHER): Payer: Medicare HMO | Admitting: Internal Medicine

## 2024-04-11 ENCOUNTER — Encounter: Payer: Self-pay | Admitting: Internal Medicine

## 2024-04-11 VITALS — BP 124/76 | HR 125 | Ht 61.0 in | Wt 235.0 lb

## 2024-04-11 DIAGNOSIS — E1142 Type 2 diabetes mellitus with diabetic polyneuropathy: Secondary | ICD-10-CM | POA: Diagnosis not present

## 2024-04-11 DIAGNOSIS — D3501 Benign neoplasm of right adrenal gland: Secondary | ICD-10-CM | POA: Diagnosis not present

## 2024-04-11 DIAGNOSIS — E1165 Type 2 diabetes mellitus with hyperglycemia: Secondary | ICD-10-CM

## 2024-04-11 DIAGNOSIS — Z794 Long term (current) use of insulin: Secondary | ICD-10-CM

## 2024-04-11 DIAGNOSIS — E039 Hypothyroidism, unspecified: Secondary | ICD-10-CM | POA: Diagnosis not present

## 2024-04-11 LAB — POCT GLYCOSYLATED HEMOGLOBIN (HGB A1C): Hemoglobin A1C: 9.2 % — AB (ref 4.0–5.6)

## 2024-04-11 MED ORDER — INSULIN LISPRO PROT & LISPRO (75-25 MIX) 100 UNIT/ML KWIKPEN: 22.0000 [IU] | PEN_INJECTOR | Freq: Two times a day (BID) | SUBCUTANEOUS | 4 refills | Status: AC

## 2024-04-11 NOTE — Patient Instructions (Addendum)
-   Take Humalog  Mix 22-24 units with Breakfast and 22-24 units with Supper    HOW TO TREAT LOW BLOOD SUGARS (Blood sugar LESS THAN 70 MG/DL) Please follow the RULE OF 15 for the treatment of hypoglycemia treatment (when your (blood sugars are less than 70 mg/dL)   STEP 1: Take 15 grams of carbohydrates when your blood sugar is low, which includes:  3-4 GLUCOSE TABS  OR 3-4 OZ OF JUICE OR REGULAR SODA OR ONE TUBE OF GLUCOSE GEL    STEP 2: RECHECK blood sugar in 15 MINUTES STEP 3: If your blood sugar is still low at the 15 minute recheck --> then, go back to STEP 1 and treat AGAIN with another 15 grams of carbohydrates.

## 2024-04-11 NOTE — Progress Notes (Signed)
 Name: [name]  Age/ Sex: 83 y.o., female   MRN/ DOB: [id number], [birth year]     PCP: [facility]   Reason for Endocrinology Evaluation: Type 2 Diabetes Mellitus  Initial Endocrine Consultative Visit: 06/21/2020    PATIENT IDENTIFIER: [name] is a 83 y.o. female with a past medical history of HTN, PAF, CHF, OSA and Dyslipidemia . The patient has followed with Endocrinology clinic since 06/21/2020 for consultative assistance with management of her diabetes.  DIABETIC HISTORY:  Ms. Puopolo was diagnosed with DM in 2011,she is intolerant to metformin  due to diarrhea. Her hemoglobin A1c has ranged from 7.3% in 2011, peaking at 9.5% in 2014.   ADRENAL HISTORY: She was noted with an incidental finding of right adrenal adenoma ~ 4 cm on CT imaging and 09/2010 during evaluation of abdominal pain.  This has been stable on CT imaging of abdomen 03/2020  She had normal saliva cortisol 05/2021  She had normal normetanephrine and metanephrines 01/2022   Patient has primary hyperaldosteronism with an elevated Aldo/PRA ratio of 63.6, low renin activity at 0.11 (in the setting of ARB) and a normal level aldosterone of 7.  Would not proceed with invasive testing due to advanced age and cardiovascular history We have opted to treat medically with spironolactone  which was started in May 2023  This was discontinued during hospitalization for CHF/A-fib 10/2023 due to hyperkalemia  Mounjaro  discontinued during hospitalization 10/2023  SUBJECTIVE:   During the last visit (12/11/2023): A1c 7.4%    Today (04/11/2024): [name] is here for follow-up on diabetes management.  She is accompanied by her friend.  She checks her blood sugars multiple times a day  times daily, through CGM . The patient has  not had hypoglycemic episodes since the last clinic visit, she self reduced    She was admitted in March, 2025 for A-fib with RVR, amlodipine , Atrovent , and pantoprazole  were  discontinued and torsemide  was changed  She continues to follow-up with cardiology for CHF, and A-fib She had another admission in May, 2025 for AKI  She denies any constipation, or diarrhea Denies nausea or vomiting  Past past the patient has not been feeling well over the past 2 days, spouse attributes this to the treatment she is receiving for hyperkalemia No cough No evidence of UTI symptoms    HOME ENDOCRINE REGIMEN:  Humalog  Mix 20 units with Breakfast and 20 units Supper    Statin: yes ACE-I/ARB: yes   CONTINUOUS GLUCOSE MONITORING RECORD INTERPRETATION: n/a            DIABETIC COMPLICATIONS: Microvascular complications:  Neuropathy Denies: CKD, , retinopathy Last eye exam: Completed 03/30/2023   Macrovascular complications:    Denies: CAD, PVD, CVA    HISTORY:  Past Medical History:  Past Medical History:  Diagnosis Date   Arthritis    Back pain    BCC (basal cell carcinoma) 04/01/2023   right lower chin, Mohs completed on 04/21/23   Chronic anticoagulation    due to aflutter   Chronic kidney disease    Diabetes mellitus    Diastolic CHF, chronic (HCC)    a.  echo 2006 - ef 55-65%; mild diast dysfxn;    b. Echo 08/2011: Mild LVH, EF 60%;  c. 04/2013 Echo: EF 65-69%, mild conc LVH;  08/2014 Echo: EF 60-65%, mild-mod MR.   Gout    Hyperlipidemia    Hypertension    a.  Renal arterial Dopplers 12/2011: 1-59% right renal artery stenosis   Morbid obesity (  HCC)    Obstructive sleep apnea on CPAP    Paroxysmal Afib/Flutter    a. dccv: 08/2011 - on amiodarone /coumadin    Past Surgical History:  Past Surgical History:  Procedure Laterality Date   APPENDECTOMY     ATRIAL FLUTTER ABLATION N/A 09/24/2011   Procedure: ATRIAL FLUTTER ABLATION;  Surgeon: Danelle LELON Birmingham, MD;  Location: [facility] CATH LAB;  Service: Cardiovascular;  Laterality: N/A;   CARDIOVERSION  10/22/2011   Procedure: CARDIOVERSION;  Surgeon: Elspeth JAYSON Sage, MD;  Location: [facility] OR;  Service:  Cardiovascular;  Laterality: N/A;   CARDIOVERSION N/A 09/10/2011   Procedure: CARDIOVERSION;  Surgeon: Elspeth JAYSON Sage, MD;  Location: [facility] CATH LAB;  Service: Cardiovascular;  Laterality: N/A;   CHOLECYSTECTOMY     COLONOSCOPY WITH PROPOFOL  N/A 06/13/2021   Procedure: COLONOSCOPY WITH PROPOFOL ;  Surgeon: Teressa Toribio SQUIBB, MD;  Location: [facility] ENDOSCOPY;  Service: Endoscopy;  Laterality: N/A;   POLYPECTOMY  06/13/2021   Procedure: POLYPECTOMY;  Surgeon: Teressa Toribio SQUIBB, MD;  Location: [facility] ENDOSCOPY;  Service: Endoscopy;;   TONSILLECTOMY  1982   TOTAL ABDOMINAL HYSTERECTOMY     Social History:  reports that she has never smoked. She has never used smokeless tobacco. She reports that she does not drink alcohol and does not use drugs. Family History:  Family History  Problem Relation Age of Onset   Heart disease Father    Hypertension Father    Breast cancer Sister    Cancer Sister        breast   Colon cancer Neg Hx    Esophageal cancer Neg Hx    Pancreatic cancer Neg Hx    Liver disease Neg Hx      HOME MEDICATIONS: Allergies as of 04/11/2024       Reactions   Doxycycline     Made patient feel very ill    Metformin  Diarrhea        Medication List        Accurate as of April 11, 2024  9:55 AM. If you have any questions, ask your nurse or doctor.          Accu-Chek Softclix Lancets lancets 1 each by Other route 3 (three) times daily. as directed   allopurinol  300 MG tablet Commonly known as: ZYLOPRIM  Take 0.5 tablets (150 mg total) by mouth daily. TAKE 1/2 tablet daily   apixaban  2.5 MG Tabs tablet Commonly known as: ELIQUIS  Take 2.5 mg by mouth 2 (two) times daily.   BD Pen Needle Nano 2nd Gen 32G X 4 MM Misc Generic drug: Insulin  Pen Needle USE TWICE DAILY AS NEEDED   blood glucose meter kit and supplies Dispense based on patient and insurance preference. Use up to four times daily as directed. (FOR ICD-10 E10.9, E11.9).   diltiazem  180 MG 24 hr capsule Commonly  known as: CARDIZEM  CD Take 1 capsule (180 mg total) by mouth daily.   FreeStyle Libre 2 Reader [place] Use to check blood glucose. Use as directed   FreeStyle Libre 2 Sensor Misc USE AS DIRECTED TO CHECK BLOOD  SUGAR DAILY   Gvoke HypoPen  2-Pack 0.5 MG/0.1ML Soaj Generic drug: Glucagon  Inject 0.5 mg into the skin daily as needed. For Hypoglycemic events   Insulin  Lispro Prot & Lispro (75-25) 100 UNIT/ML Kwikpen Commonly known as: HumaLOG  Mix 75/25 KwikPen Inject 20 Units into the skin 2 (two) times daily before a meal.   levothyroxine  112 MCG tablet Commonly known as: SYNTHROID  Take 1 tablet (112 mcg total) by mouth  daily.   metoprolol  succinate 100 MG 24 hr tablet Commonly known as: TOPROL -XL Take 100 mg by mouth 2 (two) times daily.   rosuvastatin  10 MG tablet Commonly known as: CRESTOR  Take 1 tablet (10 mg total) by mouth every evening.   torsemide  20 MG tablet Commonly known as: DEMADEX  Take 1 tablet (20 mg total) by mouth daily. Please keep scheduled appointment for future refills. Thank you.         OBJECTIVE:   Vital Signs: BP 124/76 (BP Location: Left Arm, Patient Position: Sitting, Cuff Size: Normal)   Pulse (!) 125   Ht 5' 1 (1.549 m)   Wt 235 lb (106.6 kg)   SpO2 94%   BMI 44.40 kg/m    Wt Readings from Last 3 Encounters:  04/11/24 235 lb (106.6 kg)  04/07/24 231 lb (104.8 kg)  01/06/24 248 lb (112.5 kg)     Exam: General: Pt appears well and is in NAD, using a walker  Lungs: Clear with good BS bilat   Heart: Regular  Extremities: No pretibial edema.   Neuro: MS is good with appropriate affect, pt is alert and Ox3    DM foot exam: 05/05/2023 per podiatry      DATA REVIEWED:  Lab Results  Component Value Date   HGBA1C 9.2 (A) 04/11/2024   HGBA1C 7.4 (A) 12/11/2023   HGBA1C 9.1 (A) 07/07/2023     Labs through [facility] Everywhere 04/05/2024   K 3.2 BUN 28 CR 1.45 GFR 36 Glucose 174   Old records , labs and images have been  reviewed.    ASSESSMENT / PLAN / RECOMMENDATIONS:   1) Type 2 Diabetes Mellitus, Poorly  controlled, With Neuropathic and CKD complications - Most recent A1c of 9.2%. Goal A1c < 7.5 %.    -Patient with worsening glycemic control - Intolerant to Metformin   -Mounjaro  was discontinued during hospitalization for CHF/A-fib 10/2023 -A1c is trending down - They forgot freestyle libre receiver today, no glucose data - Per spouse her BG's are persistently over 200 mg/DL, I have recommended increasing Humalog  mix to 22 units twice daily for 1 week, if hyperglycemia persist will need to increase to 24 units before breakfast and supper  MEDICATIONS:  -Take Humalog  Mix 22-24 units with Breakfast and 22-24 units with Supper    EDUCATION / INSTRUCTIONS: BG monitoring instructions: Patient is instructed to check her blood sugars 3 times a day, before meals . Call [facility] if: BG persistently < 70 I reviewed the Rule of 15 for the treatment of hypoglycemia in detail with the patient. Literature supplied.    2) Diabetic complications:  Eye: Does not have known diabetic retinopathy.  Neuro/ Feet: Does have known diabetic peripheral neuropathy .  Renal: Patient does not have known baseline CKD. She   is on an ACEI/ARB at present.   3) Right Adrenal Adenoma:   -She was diagnosed with right adrenal adenoma~4 cm on CT imaging from 2011, this has remained stable based on CT scan 01/01/2022 at 4.3 cm  -She had normal saliva cortisol testing 05/2021, metanephrines and normetanephrine's -She did have suppressed renin in the setting of aldosterone >5  and elevated Aldo: Renin ratio.  Opted with medical therapy with spironolactone  -She was on spironolactone  but this was discontinued due to hyperkalemia and AKI   4) Hypothyroidism  -TSH has been within normal range  Medication  Continue levothyroxine  112 mcg    F/U in 4 months       Signed electronically  by: Stefano Redgie Butts, MD  [facility] Endocrinology  [facility] [address] [place] [place], [place] [address] Phone: [phone] FAX: [phone]   CC: [facility] [address] [place] [address] Phone: [phone]  Fax: [phone]  Return to Endocrinology clinic as below: Future Appointments  Date Time Provider Department Center  06/23/2024  9:00 AM Neysa Reggy BIRCH, MD [facility] None

## 2024-04-18 DIAGNOSIS — Z760 Encounter for issue of repeat prescription: Secondary | ICD-10-CM | POA: Diagnosis not present

## 2024-06-07 ENCOUNTER — Other Ambulatory Visit: Payer: Self-pay | Admitting: Emergency Medicine

## 2024-06-07 DIAGNOSIS — E039 Hypothyroidism, unspecified: Secondary | ICD-10-CM

## 2024-06-07 NOTE — Telephone Encounter (Unsigned)
 Copied from CRM #[id number]. Topic: Clinical - Medication Refill >> Jun 07, 2024  8:04 AM [place] A wrote: Medication: levothyroxine  (SYNTHROID ) 112 MCG tablet; Losartan  50 MG;Sertraline  25MG ;torsemide  (DEMADEX ) 20 MG tablet; Potassium Chloride  10 MEQ tablet; Amiodarone  200MG ; Amlodipine  5MG   Has the patient contacted their pharmacy? Yes (Agent: If no, request that the patient contact the pharmacy for the refill. If patient does not wish to contact the pharmacy document the reason why and proceed with request.) (Agent: If yes, when and what did the pharmacy advise?)  This is the patient's preferred pharmacy:  [facility] - [place], [place] - [address] [place] [address] Phone: [phone] Fax: [phone]   Is this the correct pharmacy for this prescription? Yes If no, delete pharmacy and type the correct one.   Has the prescription been filled recently? No  Is the patient out of the medication? Yes  Has the patient been seen for an appointment in the last year OR does the patient have an upcoming appointment? {yes/no:[id number]}  Can we respond through MyChart? No  Agent: Please be advised that Rx refills may take up to 3 business days. We ask that you follow-up with your pharmacy.

## 2024-06-10 DIAGNOSIS — I130 Hypertensive heart and chronic kidney disease with heart failure and stage 1 through stage 4 chronic kidney disease, or unspecified chronic kidney disease: Secondary | ICD-10-CM | POA: Diagnosis not present

## 2024-06-10 DIAGNOSIS — I5032 Chronic diastolic (congestive) heart failure: Secondary | ICD-10-CM | POA: Diagnosis not present

## 2024-06-10 DIAGNOSIS — E119 Type 2 diabetes mellitus without complications: Secondary | ICD-10-CM | POA: Diagnosis not present

## 2024-06-10 DIAGNOSIS — N1832 Chronic kidney disease, stage 3b: Secondary | ICD-10-CM | POA: Diagnosis not present

## 2024-06-14 DIAGNOSIS — I130 Hypertensive heart and chronic kidney disease with heart failure and stage 1 through stage 4 chronic kidney disease, or unspecified chronic kidney disease: Secondary | ICD-10-CM | POA: Diagnosis not present

## 2024-06-14 DIAGNOSIS — D6869 Other thrombophilia: Secondary | ICD-10-CM | POA: Diagnosis not present

## 2024-06-14 DIAGNOSIS — I5032 Chronic diastolic (congestive) heart failure: Secondary | ICD-10-CM | POA: Diagnosis not present

## 2024-06-14 DIAGNOSIS — R54 Age-related physical debility: Secondary | ICD-10-CM | POA: Diagnosis not present

## 2024-06-14 DIAGNOSIS — I482 Chronic atrial fibrillation, unspecified: Secondary | ICD-10-CM | POA: Diagnosis not present

## 2024-06-14 DIAGNOSIS — Z1389 Encounter for screening for other disorder: Secondary | ICD-10-CM | POA: Diagnosis not present

## 2024-06-14 DIAGNOSIS — N1832 Chronic kidney disease, stage 3b: Secondary | ICD-10-CM | POA: Diagnosis not present

## 2024-06-14 DIAGNOSIS — E039 Hypothyroidism, unspecified: Secondary | ICD-10-CM | POA: Diagnosis not present

## 2024-06-14 DIAGNOSIS — Z7901 Long term (current) use of anticoagulants: Secondary | ICD-10-CM | POA: Diagnosis not present

## 2024-06-16 DIAGNOSIS — I4821 Permanent atrial fibrillation: Secondary | ICD-10-CM | POA: Diagnosis not present

## 2024-06-16 DIAGNOSIS — I5032 Chronic diastolic (congestive) heart failure: Secondary | ICD-10-CM | POA: Diagnosis not present

## 2024-06-16 DIAGNOSIS — I130 Hypertensive heart and chronic kidney disease with heart failure and stage 1 through stage 4 chronic kidney disease, or unspecified chronic kidney disease: Secondary | ICD-10-CM | POA: Diagnosis not present

## 2024-06-16 DIAGNOSIS — J45909 Unspecified asthma, uncomplicated: Secondary | ICD-10-CM | POA: Diagnosis not present

## 2024-06-16 DIAGNOSIS — E1122 Type 2 diabetes mellitus with diabetic chronic kidney disease: Secondary | ICD-10-CM | POA: Diagnosis not present

## 2024-06-16 DIAGNOSIS — D631 Anemia in chronic kidney disease: Secondary | ICD-10-CM | POA: Diagnosis not present

## 2024-06-16 DIAGNOSIS — E1142 Type 2 diabetes mellitus with diabetic polyneuropathy: Secondary | ICD-10-CM | POA: Diagnosis not present

## 2024-06-16 DIAGNOSIS — G4733 Obstructive sleep apnea (adult) (pediatric): Secondary | ICD-10-CM | POA: Diagnosis not present

## 2024-06-16 DIAGNOSIS — N1832 Chronic kidney disease, stage 3b: Secondary | ICD-10-CM | POA: Diagnosis not present

## 2024-06-17 ENCOUNTER — Telehealth: Payer: Self-pay

## 2024-06-17 DIAGNOSIS — E1142 Type 2 diabetes mellitus with diabetic polyneuropathy: Secondary | ICD-10-CM | POA: Diagnosis not present

## 2024-06-17 DIAGNOSIS — D631 Anemia in chronic kidney disease: Secondary | ICD-10-CM | POA: Diagnosis not present

## 2024-06-17 DIAGNOSIS — J45909 Unspecified asthma, uncomplicated: Secondary | ICD-10-CM | POA: Diagnosis not present

## 2024-06-17 DIAGNOSIS — G4733 Obstructive sleep apnea (adult) (pediatric): Secondary | ICD-10-CM | POA: Diagnosis not present

## 2024-06-17 DIAGNOSIS — N1832 Chronic kidney disease, stage 3b: Secondary | ICD-10-CM | POA: Diagnosis not present

## 2024-06-17 DIAGNOSIS — I4821 Permanent atrial fibrillation: Secondary | ICD-10-CM | POA: Diagnosis not present

## 2024-06-17 DIAGNOSIS — I5032 Chronic diastolic (congestive) heart failure: Secondary | ICD-10-CM | POA: Diagnosis not present

## 2024-06-17 DIAGNOSIS — E1122 Type 2 diabetes mellitus with diabetic chronic kidney disease: Secondary | ICD-10-CM | POA: Diagnosis not present

## 2024-06-17 DIAGNOSIS — I130 Hypertensive heart and chronic kidney disease with heart failure and stage 1 through stage 4 chronic kidney disease, or unspecified chronic kidney disease: Secondary | ICD-10-CM | POA: Diagnosis not present

## 2024-06-17 NOTE — Telephone Encounter (Signed)
Attempted to contact patient no answer and no vm set up  

## 2024-06-21 NOTE — Progress Notes (Deleted)
 HPI F never smoker followed for OSA, Asthma/ COPD, complicated by dCHF, Aortic Atherosclerosis, HTN, PAFib/Amiodarone /Eliquis , Varicose Veins, Restrictive Lung Disease, GERD, Nonalcoholic Fatty Liver, Hypothyroid, DM2/ neuropathy, GOUT, Morbid Obesity,  NPSG 06/05/11-AHI 20.1/ hr, desaturation to  87%, body weight 261 lbs, CPAP 10 PFT 03/25/17- moderately severe obstruction, insignificant response to BD, Nl TLC, DLCO mildly reduced. ==============================================================================    06/16/22- 80 yoF never smoker followed for OSA, Asthma/ COPD, Chronic Cough, Lung Nodule, complicated by dCHF, Aortic Atherosclerosis, HTN, PAFib/Amiodarone /Eliquis , Varicose Veins, Restrictive Lung Disease, GERD, Nonalcoholic Fatty Liver, Hypothyroid, DM2/ neuropathy, GOUT, Morbid Obesity,            -no bronchodilators -  Hycodan, amiodarone , Breztri  CPAP auto 5-20/ Luna/ [employer]    replaced 2017   replacement ordered 01/03/21   Download-compliance (iCodeConnect) 30%, AHI 0.6/ hr Body weight today-255 lbs Covid vax-no ONOX on CPAP/ room air was ordered- not done Feels very well since diuresed. Breathing comfortable. Still sedentary. Denies cough or wheeze. Download reviewed. Compliance goals reinforced. Shows me ecchymoses at injection sites on abd- she will discuss with her diabetes doctor. HRCT chest 05/07/22- IMPRESSION: 1. Interval resolution of previously seen small bilateral pleural effusions as well as interlobular septal thickening seen in the lung bases, consistent with resolution of edema. Mild, bland appearing residual scarring and or atelectasis. No evidence of fibrotic interstitial lung disease. 2. Cardiomegaly and coronary artery disease. 3. Enlargement of the main pulmonary artery, as can be seen in pulmonary hypertension. Aortic Atherosclerosis (ICD10-I70.0).  06/23/24- 82 yoF never smoker followed for OSA, Asthma/ COPD, Chronic Cough, Lung Nodule, complicated by dCHF,  Aortic Atherosclerosis, HTN, PAFib/Amiodarone /Eliquis , Varicose Veins, Restrictive Lung Disease, GERD, Nonalcoholic Fatty Liver, Hypothyroid, DM2/ neuropathy, GOUT, Morbid Obesity,  -  Hycodan, amiodarone , Breztri  CPAP auto 5-20/ Luna/ [employer]    replaced 2017   replacement ordered 01/03/21   Download-compliance (iCodeConnect)  Body weight today-   CXR 09/01/23 FINDINGS: Borderline cardiomegaly. No pleural effusion. No pneumothorax. No new focal airspace opacity. No radiographically apparent displaced rib fractures. Visualized upper abdomen unremarkable. Unchanged cardiac and mediastinal contours. Aortic atherosclerotic calcifications vertebral body heights are maintained. IMPRESSION: No new focal airspace opacity.   ROS-see HPI   + = positive Constitutional:    weight loss, night sweats, fevers, chills, fatigue, lassitude. HEENT:    headaches, difficulty swallowing, tooth/dental problems, sore throat,       sneezing, itching, ear ache, nasal congestion, post nasal drip, snoring CV:    chest pain, orthopnea, PND, swelling in lower extremities, anasarca,                                   dizziness, palpitations Resp:   +shortness of breath with exertion or at rest.                productive cough,   non-productive cough, coughing up of blood.              change in color of mucus.  wheezing.   Skin:    rash or lesions. GI:  No-   heartburn, indigestion, abdominal pain, nausea, vomiting, diarrhea,                 change in bowel habits, loss of appetite GU: dysuria, change in color of urine, +urgency or frequency.   flank pain. MS:   joint pain, stiffness, decreased range of motion, back pain. Neuro-     nothing unusual  Psych:  change in mood or affect.  depression or anxiety.   memory loss.  OBJ- Physical Exam General- Alert, Oriented, Affect-appropriate, Distress- none acute, + morbidly obese,  Skin- +ecchymoses abd from injections Lymphadenopathy- none Head- atraumatic             Eyes- Gross vision intact, PERRLA, conjunctivae and secretions clear,             Ears- Hearing, canals-normal            Nose- Clear, no-Septal dev, mucus, polyps, erosion, perforation             Throat- Mallampati II-III , mucosa clear , drainage- none, tonsils- absent, + teeth Neck- flexible , trachea midline, no stridor , thyroid  nl, carotid no bruit Chest - symmetrical excursion , unlabored           Heart/CV- RRR/ AFib , + murmur 1-2+S , no gallop  , no rub, nl s1 s2                           - JVD-none, edema- none, stasis changes- none, varices- none           Lung- clear to P&A, no rales or crackles, wheeze- none, cough- none , dullness-none, rub- none           Chest wall-  Abd-  Br/ Gen/ Rectal- Not done, not indicated Extrem- cyanosis- none, clubbing, none, atrophy- none, strength- nl Neuro- grossly intact to observation

## 2024-06-22 ENCOUNTER — Telehealth: Payer: Self-pay

## 2024-06-22 DIAGNOSIS — E1122 Type 2 diabetes mellitus with diabetic chronic kidney disease: Secondary | ICD-10-CM | POA: Diagnosis not present

## 2024-06-22 DIAGNOSIS — N1832 Chronic kidney disease, stage 3b: Secondary | ICD-10-CM | POA: Diagnosis not present

## 2024-06-22 DIAGNOSIS — D631 Anemia in chronic kidney disease: Secondary | ICD-10-CM | POA: Diagnosis not present

## 2024-06-22 DIAGNOSIS — R918 Other nonspecific abnormal finding of lung field: Secondary | ICD-10-CM | POA: Diagnosis not present

## 2024-06-22 DIAGNOSIS — E1142 Type 2 diabetes mellitus with diabetic polyneuropathy: Secondary | ICD-10-CM | POA: Diagnosis not present

## 2024-06-22 DIAGNOSIS — R0602 Shortness of breath: Secondary | ICD-10-CM | POA: Diagnosis not present

## 2024-06-22 DIAGNOSIS — I5032 Chronic diastolic (congestive) heart failure: Secondary | ICD-10-CM | POA: Diagnosis not present

## 2024-06-22 DIAGNOSIS — I4821 Permanent atrial fibrillation: Secondary | ICD-10-CM | POA: Diagnosis not present

## 2024-06-22 DIAGNOSIS — G4733 Obstructive sleep apnea (adult) (pediatric): Secondary | ICD-10-CM | POA: Diagnosis not present

## 2024-06-22 DIAGNOSIS — I130 Hypertensive heart and chronic kidney disease with heart failure and stage 1 through stage 4 chronic kidney disease, or unspecified chronic kidney disease: Secondary | ICD-10-CM | POA: Diagnosis not present

## 2024-06-22 DIAGNOSIS — J45909 Unspecified asthma, uncomplicated: Secondary | ICD-10-CM | POA: Diagnosis not present

## 2024-06-22 DIAGNOSIS — J90 Pleural effusion, not elsewhere classified: Secondary | ICD-10-CM | POA: Diagnosis not present

## 2024-06-22 DIAGNOSIS — R600 Localized edema: Secondary | ICD-10-CM | POA: Diagnosis not present

## 2024-06-22 NOTE — Telephone Encounter (Addendum)
 Called to know patient cpap usage. Report says it was last used 2022. Mailbox full

## 2024-06-23 ENCOUNTER — Ambulatory Visit: Admitting: Internal Medicine

## 2024-06-23 MED ORDER — TORSEMIDE 20 MG PO TABS: 20.0000 mg | ORAL_TABLET | Freq: Every day | ORAL | 0 refills | Status: DC

## 2024-06-23 MED ORDER — LEVOTHYROXINE SODIUM 112 MCG PO TABS: 112.0000 ug | ORAL_TABLET | Freq: Every day | ORAL | 3 refills | Status: AC

## 2024-06-24 DIAGNOSIS — E1142 Type 2 diabetes mellitus with diabetic polyneuropathy: Secondary | ICD-10-CM | POA: Diagnosis not present

## 2024-06-24 DIAGNOSIS — I4821 Permanent atrial fibrillation: Secondary | ICD-10-CM | POA: Diagnosis not present

## 2024-06-24 DIAGNOSIS — N1832 Chronic kidney disease, stage 3b: Secondary | ICD-10-CM | POA: Diagnosis not present

## 2024-06-24 DIAGNOSIS — E1122 Type 2 diabetes mellitus with diabetic chronic kidney disease: Secondary | ICD-10-CM | POA: Diagnosis not present

## 2024-06-24 DIAGNOSIS — D631 Anemia in chronic kidney disease: Secondary | ICD-10-CM | POA: Diagnosis not present

## 2024-06-24 DIAGNOSIS — I5032 Chronic diastolic (congestive) heart failure: Secondary | ICD-10-CM | POA: Diagnosis not present

## 2024-06-24 DIAGNOSIS — I130 Hypertensive heart and chronic kidney disease with heart failure and stage 1 through stage 4 chronic kidney disease, or unspecified chronic kidney disease: Secondary | ICD-10-CM | POA: Diagnosis not present

## 2024-06-24 DIAGNOSIS — G4733 Obstructive sleep apnea (adult) (pediatric): Secondary | ICD-10-CM | POA: Diagnosis not present

## 2024-06-24 DIAGNOSIS — J45909 Unspecified asthma, uncomplicated: Secondary | ICD-10-CM | POA: Diagnosis not present

## 2024-06-29 DIAGNOSIS — I5032 Chronic diastolic (congestive) heart failure: Secondary | ICD-10-CM | POA: Diagnosis not present

## 2024-06-29 DIAGNOSIS — I130 Hypertensive heart and chronic kidney disease with heart failure and stage 1 through stage 4 chronic kidney disease, or unspecified chronic kidney disease: Secondary | ICD-10-CM | POA: Diagnosis not present

## 2024-06-29 DIAGNOSIS — N1832 Chronic kidney disease, stage 3b: Secondary | ICD-10-CM | POA: Diagnosis not present

## 2024-06-29 DIAGNOSIS — G4733 Obstructive sleep apnea (adult) (pediatric): Secondary | ICD-10-CM | POA: Diagnosis not present

## 2024-06-29 DIAGNOSIS — D631 Anemia in chronic kidney disease: Secondary | ICD-10-CM | POA: Diagnosis not present

## 2024-06-29 DIAGNOSIS — E1142 Type 2 diabetes mellitus with diabetic polyneuropathy: Secondary | ICD-10-CM | POA: Diagnosis not present

## 2024-06-29 DIAGNOSIS — J45909 Unspecified asthma, uncomplicated: Secondary | ICD-10-CM | POA: Diagnosis not present

## 2024-06-29 DIAGNOSIS — I4821 Permanent atrial fibrillation: Secondary | ICD-10-CM | POA: Diagnosis not present

## 2024-06-29 DIAGNOSIS — E1122 Type 2 diabetes mellitus with diabetic chronic kidney disease: Secondary | ICD-10-CM | POA: Diagnosis not present

## 2024-07-01 DIAGNOSIS — J45909 Unspecified asthma, uncomplicated: Secondary | ICD-10-CM | POA: Diagnosis not present

## 2024-07-01 DIAGNOSIS — I130 Hypertensive heart and chronic kidney disease with heart failure and stage 1 through stage 4 chronic kidney disease, or unspecified chronic kidney disease: Secondary | ICD-10-CM | POA: Diagnosis not present

## 2024-07-01 DIAGNOSIS — I4821 Permanent atrial fibrillation: Secondary | ICD-10-CM | POA: Diagnosis not present

## 2024-07-01 DIAGNOSIS — N1832 Chronic kidney disease, stage 3b: Secondary | ICD-10-CM | POA: Diagnosis not present

## 2024-07-01 DIAGNOSIS — I5032 Chronic diastolic (congestive) heart failure: Secondary | ICD-10-CM | POA: Diagnosis not present

## 2024-07-01 DIAGNOSIS — E1122 Type 2 diabetes mellitus with diabetic chronic kidney disease: Secondary | ICD-10-CM | POA: Diagnosis not present

## 2024-07-01 DIAGNOSIS — E1142 Type 2 diabetes mellitus with diabetic polyneuropathy: Secondary | ICD-10-CM | POA: Diagnosis not present

## 2024-07-01 DIAGNOSIS — D631 Anemia in chronic kidney disease: Secondary | ICD-10-CM | POA: Diagnosis not present

## 2024-07-01 DIAGNOSIS — G4733 Obstructive sleep apnea (adult) (pediatric): Secondary | ICD-10-CM | POA: Diagnosis not present

## 2024-07-06 DIAGNOSIS — G4733 Obstructive sleep apnea (adult) (pediatric): Secondary | ICD-10-CM | POA: Diagnosis not present

## 2024-07-06 DIAGNOSIS — I5032 Chronic diastolic (congestive) heart failure: Secondary | ICD-10-CM | POA: Diagnosis not present

## 2024-07-06 DIAGNOSIS — I130 Hypertensive heart and chronic kidney disease with heart failure and stage 1 through stage 4 chronic kidney disease, or unspecified chronic kidney disease: Secondary | ICD-10-CM | POA: Diagnosis not present

## 2024-07-06 DIAGNOSIS — E1122 Type 2 diabetes mellitus with diabetic chronic kidney disease: Secondary | ICD-10-CM | POA: Diagnosis not present

## 2024-07-06 DIAGNOSIS — I4821 Permanent atrial fibrillation: Secondary | ICD-10-CM | POA: Diagnosis not present

## 2024-07-06 DIAGNOSIS — J45909 Unspecified asthma, uncomplicated: Secondary | ICD-10-CM | POA: Diagnosis not present

## 2024-07-06 DIAGNOSIS — D631 Anemia in chronic kidney disease: Secondary | ICD-10-CM | POA: Diagnosis not present

## 2024-07-06 DIAGNOSIS — E1142 Type 2 diabetes mellitus with diabetic polyneuropathy: Secondary | ICD-10-CM | POA: Diagnosis not present

## 2024-07-06 DIAGNOSIS — N1832 Chronic kidney disease, stage 3b: Secondary | ICD-10-CM | POA: Diagnosis not present

## 2024-07-08 DIAGNOSIS — I5032 Chronic diastolic (congestive) heart failure: Secondary | ICD-10-CM | POA: Diagnosis not present

## 2024-07-08 DIAGNOSIS — I700 Atherosclerosis of aorta: Secondary | ICD-10-CM | POA: Diagnosis not present

## 2024-07-08 DIAGNOSIS — N1831 Chronic kidney disease, stage 3a: Secondary | ICD-10-CM | POA: Diagnosis not present

## 2024-07-08 DIAGNOSIS — E1142 Type 2 diabetes mellitus with diabetic polyneuropathy: Secondary | ICD-10-CM | POA: Diagnosis not present

## 2024-07-08 DIAGNOSIS — I130 Hypertensive heart and chronic kidney disease with heart failure and stage 1 through stage 4 chronic kidney disease, or unspecified chronic kidney disease: Secondary | ICD-10-CM | POA: Diagnosis not present

## 2024-07-08 DIAGNOSIS — E1159 Type 2 diabetes mellitus with other circulatory complications: Secondary | ICD-10-CM | POA: Diagnosis not present

## 2024-07-08 DIAGNOSIS — M1030 Gout due to renal impairment, unspecified site: Secondary | ICD-10-CM | POA: Diagnosis not present

## 2024-07-08 DIAGNOSIS — D631 Anemia in chronic kidney disease: Secondary | ICD-10-CM | POA: Diagnosis not present

## 2024-07-13 DIAGNOSIS — E1142 Type 2 diabetes mellitus with diabetic polyneuropathy: Secondary | ICD-10-CM | POA: Diagnosis not present

## 2024-07-13 DIAGNOSIS — I5032 Chronic diastolic (congestive) heart failure: Secondary | ICD-10-CM | POA: Diagnosis not present

## 2024-07-13 DIAGNOSIS — I130 Hypertensive heart and chronic kidney disease with heart failure and stage 1 through stage 4 chronic kidney disease, or unspecified chronic kidney disease: Secondary | ICD-10-CM | POA: Diagnosis not present

## 2024-07-13 DIAGNOSIS — E1122 Type 2 diabetes mellitus with diabetic chronic kidney disease: Secondary | ICD-10-CM | POA: Diagnosis not present

## 2024-07-13 DIAGNOSIS — J45909 Unspecified asthma, uncomplicated: Secondary | ICD-10-CM | POA: Diagnosis not present

## 2024-07-13 DIAGNOSIS — N1831 Chronic kidney disease, stage 3a: Secondary | ICD-10-CM | POA: Diagnosis not present

## 2024-07-13 DIAGNOSIS — N1832 Chronic kidney disease, stage 3b: Secondary | ICD-10-CM | POA: Diagnosis not present

## 2024-07-13 DIAGNOSIS — I4821 Permanent atrial fibrillation: Secondary | ICD-10-CM | POA: Diagnosis not present

## 2024-07-13 DIAGNOSIS — D631 Anemia in chronic kidney disease: Secondary | ICD-10-CM | POA: Diagnosis not present

## 2024-07-13 DIAGNOSIS — I700 Atherosclerosis of aorta: Secondary | ICD-10-CM | POA: Diagnosis not present

## 2024-07-13 DIAGNOSIS — M1030 Gout due to renal impairment, unspecified site: Secondary | ICD-10-CM | POA: Diagnosis not present

## 2024-07-13 DIAGNOSIS — E1159 Type 2 diabetes mellitus with other circulatory complications: Secondary | ICD-10-CM | POA: Diagnosis not present

## 2024-07-13 DIAGNOSIS — G4733 Obstructive sleep apnea (adult) (pediatric): Secondary | ICD-10-CM | POA: Diagnosis not present

## 2024-07-15 DIAGNOSIS — E1159 Type 2 diabetes mellitus with other circulatory complications: Secondary | ICD-10-CM | POA: Diagnosis not present

## 2024-07-15 DIAGNOSIS — R296 Repeated falls: Secondary | ICD-10-CM | POA: Diagnosis not present

## 2024-07-21 DIAGNOSIS — D631 Anemia in chronic kidney disease: Secondary | ICD-10-CM | POA: Diagnosis not present

## 2024-07-21 DIAGNOSIS — I5032 Chronic diastolic (congestive) heart failure: Secondary | ICD-10-CM | POA: Diagnosis not present

## 2024-07-21 DIAGNOSIS — M1030 Gout due to renal impairment, unspecified site: Secondary | ICD-10-CM | POA: Diagnosis not present

## 2024-07-21 DIAGNOSIS — E1142 Type 2 diabetes mellitus with diabetic polyneuropathy: Secondary | ICD-10-CM | POA: Diagnosis not present

## 2024-07-21 DIAGNOSIS — I130 Hypertensive heart and chronic kidney disease with heart failure and stage 1 through stage 4 chronic kidney disease, or unspecified chronic kidney disease: Secondary | ICD-10-CM | POA: Diagnosis not present

## 2024-07-21 DIAGNOSIS — N1831 Chronic kidney disease, stage 3a: Secondary | ICD-10-CM | POA: Diagnosis not present

## 2024-07-21 DIAGNOSIS — E1159 Type 2 diabetes mellitus with other circulatory complications: Secondary | ICD-10-CM | POA: Diagnosis not present

## 2024-07-25 DIAGNOSIS — I130 Hypertensive heart and chronic kidney disease with heart failure and stage 1 through stage 4 chronic kidney disease, or unspecified chronic kidney disease: Secondary | ICD-10-CM | POA: Diagnosis not present

## 2024-07-25 DIAGNOSIS — D631 Anemia in chronic kidney disease: Secondary | ICD-10-CM | POA: Diagnosis not present

## 2024-07-25 DIAGNOSIS — M1030 Gout due to renal impairment, unspecified site: Secondary | ICD-10-CM | POA: Diagnosis not present

## 2024-07-25 DIAGNOSIS — E1142 Type 2 diabetes mellitus with diabetic polyneuropathy: Secondary | ICD-10-CM | POA: Diagnosis not present

## 2024-07-25 DIAGNOSIS — N1831 Chronic kidney disease, stage 3a: Secondary | ICD-10-CM | POA: Diagnosis not present

## 2024-07-25 DIAGNOSIS — I5032 Chronic diastolic (congestive) heart failure: Secondary | ICD-10-CM | POA: Diagnosis not present

## 2024-07-25 DIAGNOSIS — I700 Atherosclerosis of aorta: Secondary | ICD-10-CM | POA: Diagnosis not present

## 2024-07-25 DIAGNOSIS — E1159 Type 2 diabetes mellitus with other circulatory complications: Secondary | ICD-10-CM | POA: Diagnosis not present

## 2024-07-27 DIAGNOSIS — M1030 Gout due to renal impairment, unspecified site: Secondary | ICD-10-CM | POA: Diagnosis not present

## 2024-08-02 DIAGNOSIS — M1030 Gout due to renal impairment, unspecified site: Secondary | ICD-10-CM | POA: Diagnosis not present

## 2024-08-02 DIAGNOSIS — Z794 Long term (current) use of insulin: Secondary | ICD-10-CM | POA: Diagnosis not present

## 2024-08-02 DIAGNOSIS — Z5982 Transportation insecurity: Secondary | ICD-10-CM | POA: Diagnosis not present

## 2024-08-02 DIAGNOSIS — E785 Hyperlipidemia, unspecified: Secondary | ICD-10-CM | POA: Diagnosis not present

## 2024-08-02 DIAGNOSIS — I130 Hypertensive heart and chronic kidney disease with heart failure and stage 1 through stage 4 chronic kidney disease, or unspecified chronic kidney disease: Secondary | ICD-10-CM | POA: Diagnosis not present

## 2024-08-02 DIAGNOSIS — Z9989 Dependence on other enabling machines and devices: Secondary | ICD-10-CM | POA: Diagnosis not present

## 2024-08-02 DIAGNOSIS — I509 Heart failure, unspecified: Secondary | ICD-10-CM | POA: Diagnosis not present

## 2024-08-02 DIAGNOSIS — K219 Gastro-esophageal reflux disease without esophagitis: Secondary | ICD-10-CM | POA: Diagnosis not present

## 2024-08-02 DIAGNOSIS — I4891 Unspecified atrial fibrillation: Secondary | ICD-10-CM | POA: Diagnosis not present

## 2024-08-02 DIAGNOSIS — F0390 Unspecified dementia without behavioral disturbance: Secondary | ICD-10-CM | POA: Diagnosis not present

## 2024-08-02 DIAGNOSIS — E1159 Type 2 diabetes mellitus with other circulatory complications: Secondary | ICD-10-CM | POA: Diagnosis not present

## 2024-08-02 DIAGNOSIS — I700 Atherosclerosis of aorta: Secondary | ICD-10-CM | POA: Diagnosis not present

## 2024-08-02 DIAGNOSIS — D631 Anemia in chronic kidney disease: Secondary | ICD-10-CM | POA: Diagnosis not present

## 2024-08-02 DIAGNOSIS — N1831 Chronic kidney disease, stage 3a: Secondary | ICD-10-CM | POA: Diagnosis not present

## 2024-08-02 DIAGNOSIS — E1142 Type 2 diabetes mellitus with diabetic polyneuropathy: Secondary | ICD-10-CM | POA: Diagnosis not present

## 2024-08-02 DIAGNOSIS — D509 Iron deficiency anemia, unspecified: Secondary | ICD-10-CM | POA: Diagnosis not present

## 2024-08-02 DIAGNOSIS — I5032 Chronic diastolic (congestive) heart failure: Secondary | ICD-10-CM | POA: Diagnosis not present

## 2024-08-02 DIAGNOSIS — I110 Hypertensive heart disease with heart failure: Secondary | ICD-10-CM | POA: Diagnosis not present

## 2024-08-02 DIAGNOSIS — Z6841 Body Mass Index (BMI) 40.0 and over, adult: Secondary | ICD-10-CM | POA: Diagnosis not present

## 2024-08-02 DIAGNOSIS — D6869 Other thrombophilia: Secondary | ICD-10-CM | POA: Diagnosis not present

## 2024-08-05 DIAGNOSIS — I5032 Chronic diastolic (congestive) heart failure: Secondary | ICD-10-CM | POA: Diagnosis not present

## 2024-08-05 DIAGNOSIS — N1831 Chronic kidney disease, stage 3a: Secondary | ICD-10-CM | POA: Diagnosis not present

## 2024-08-05 DIAGNOSIS — D631 Anemia in chronic kidney disease: Secondary | ICD-10-CM | POA: Diagnosis not present

## 2024-08-05 DIAGNOSIS — E1159 Type 2 diabetes mellitus with other circulatory complications: Secondary | ICD-10-CM | POA: Diagnosis not present

## 2024-08-05 DIAGNOSIS — I130 Hypertensive heart and chronic kidney disease with heart failure and stage 1 through stage 4 chronic kidney disease, or unspecified chronic kidney disease: Secondary | ICD-10-CM | POA: Diagnosis not present

## 2024-08-05 DIAGNOSIS — M1030 Gout due to renal impairment, unspecified site: Secondary | ICD-10-CM | POA: Diagnosis not present

## 2024-08-05 DIAGNOSIS — I700 Atherosclerosis of aorta: Secondary | ICD-10-CM | POA: Diagnosis not present

## 2024-08-05 DIAGNOSIS — E1142 Type 2 diabetes mellitus with diabetic polyneuropathy: Secondary | ICD-10-CM | POA: Diagnosis not present

## 2024-08-08 DIAGNOSIS — I130 Hypertensive heart and chronic kidney disease with heart failure and stage 1 through stage 4 chronic kidney disease, or unspecified chronic kidney disease: Secondary | ICD-10-CM | POA: Diagnosis not present

## 2024-08-08 DIAGNOSIS — Z6841 Body Mass Index (BMI) 40.0 and over, adult: Secondary | ICD-10-CM | POA: Diagnosis not present

## 2024-08-08 DIAGNOSIS — E785 Hyperlipidemia, unspecified: Secondary | ICD-10-CM | POA: Diagnosis not present

## 2024-08-08 DIAGNOSIS — I4891 Unspecified atrial fibrillation: Secondary | ICD-10-CM | POA: Diagnosis not present

## 2024-08-08 DIAGNOSIS — J929 Pleural plaque without asbestos: Secondary | ICD-10-CM | POA: Diagnosis not present

## 2024-08-08 DIAGNOSIS — R918 Other nonspecific abnormal finding of lung field: Secondary | ICD-10-CM | POA: Diagnosis not present

## 2024-08-08 DIAGNOSIS — J9601 Acute respiratory failure with hypoxia: Secondary | ICD-10-CM | POA: Diagnosis not present

## 2024-08-08 DIAGNOSIS — I5033 Acute on chronic diastolic (congestive) heart failure: Secondary | ICD-10-CM | POA: Diagnosis not present

## 2024-08-08 DIAGNOSIS — I2510 Atherosclerotic heart disease of native coronary artery without angina pectoris: Secondary | ICD-10-CM | POA: Diagnosis not present

## 2024-08-08 DIAGNOSIS — R590 Localized enlarged lymph nodes: Secondary | ICD-10-CM | POA: Diagnosis not present

## 2024-08-08 DIAGNOSIS — J984 Other disorders of lung: Secondary | ICD-10-CM | POA: Diagnosis not present

## 2024-08-08 DIAGNOSIS — D689 Coagulation defect, unspecified: Secondary | ICD-10-CM | POA: Diagnosis not present

## 2024-08-08 DIAGNOSIS — E039 Hypothyroidism, unspecified: Secondary | ICD-10-CM | POA: Diagnosis not present

## 2024-08-08 DIAGNOSIS — N1832 Chronic kidney disease, stage 3b: Secondary | ICD-10-CM | POA: Diagnosis not present

## 2024-08-08 DIAGNOSIS — D649 Anemia, unspecified: Secondary | ICD-10-CM | POA: Diagnosis not present

## 2024-08-08 DIAGNOSIS — I5021 Acute systolic (congestive) heart failure: Secondary | ICD-10-CM | POA: Diagnosis not present

## 2024-08-08 DIAGNOSIS — R0602 Shortness of breath: Secondary | ICD-10-CM | POA: Diagnosis not present

## 2024-08-12 ENCOUNTER — Ambulatory Visit: Admitting: Internal Medicine

## 2024-08-12 NOTE — Progress Notes (Deleted)
 Name: [name]  Age/ Sex: 83 y.o., female   MRN/ DOB: [id number], 08-25-1941     PCP: [facility]   Reason for Endocrinology Evaluation: Type 2 Diabetes Mellitus  Initial Endocrine Consultative Visit: 06/21/2020    PATIENT IDENTIFIER: [name] is a 83 y.o. female with a past medical history of HTN, PAF, CHF, OSA and Dyslipidemia . The patient has followed with Endocrinology clinic since 06/21/2020 for consultative assistance with management of her diabetes.  DIABETIC HISTORY:  [name] was diagnosed with DM in 2011,she is intolerant to metformin  due to diarrhea. Her hemoglobin A1c has ranged from 7.3% in 2011, peaking at 9.5% in 2014.   ADRENAL HISTORY: She was noted with an incidental finding of right adrenal adenoma ~ 4 cm on CT imaging and 09/2010 during evaluation of abdominal pain.  This has been stable on CT imaging of abdomen 03/2020  She had normal saliva cortisol 05/2021  She had normal normetanephrine and metanephrines 01/2022   Patient has primary hyperaldosteronism with an elevated Aldo/PRA ratio of 63.6, low renin activity at 0.11 (in the setting of ARB) and a normal level aldosterone of 7.  Would not proceed with invasive testing due to advanced age and cardiovascular history We have opted to treat medically with spironolactone  which was started in May 2023  This was discontinued during hospitalization for CHF/A-fib 10/2023 due to hyperkalemia  Mounjaro  discontinued during hospitalization 10/2023  SUBJECTIVE:   During the last visit (04/11/2024): A1c 9.2%    Today (08/12/2024): [name] is here for follow-up on diabetes management.  She is accompanied by her friend.  She checks her blood sugars multiple times a day  times daily, through CGM . The patient has  not had hypoglycemic episodes since the last clinic visit, she self reduced    Unfortunately, she continues to recurrent hospitalization, last hospitalization was  October/20/2025 due to respiratory distress, hypoxia, and CHF, the patient was diuresed  She continues to follow-up with cardiology for CHF, and A-fib   HOME ENDOCRINE REGIMEN:  Humalog  Mix 202-24 units BID    Statin: yes ACE-I/ARB: yes   CONTINUOUS GLUCOSE MONITORING RECORD INTERPRETATION: n/a            DIABETIC COMPLICATIONS: Microvascular complications:  Neuropathy Denies: CKD, , retinopathy Last eye exam: Completed 03/30/2023   Macrovascular complications:    Denies: CAD, PVD, CVA    HISTORY:  Past Medical History:  Past Medical History:  Diagnosis Date   Arthritis    Back pain    BCC (basal cell carcinoma) 04/01/2023   right lower chin, Mohs completed on 04/21/23   Chronic anticoagulation    due to aflutter   Chronic kidney disease    Diabetes mellitus    Diastolic CHF, chronic (HCC)    a.  echo 2006 - ef 55-65%; mild diast dysfxn;    b. Echo 08/2011: Mild LVH, EF 60%;  c. 04/2013 Echo: EF 65-69%, mild conc LVH;  08/2014 Echo: EF 60-65%, mild-mod MR.   Gout    Hyperlipidemia    Hypertension    a.  Renal arterial Dopplers 12/2011: 1-59% right renal artery stenosis   Morbid obesity (HCC)    Obstructive sleep apnea on CPAP    Paroxysmal Afib/Flutter    a. dccv: 08/2011 - on amiodarone /coumadin    Past Surgical History:  Past Surgical History:  Procedure Laterality Date   APPENDECTOMY     ATRIAL FLUTTER ABLATION N/A 09/24/2011   Procedure: ATRIAL FLUTTER ABLATION;  Surgeon: Danelle ORN  Waddell, MD;  Location: [facility] CATH LAB;  Service: Cardiovascular;  Laterality: N/A;   CARDIOVERSION  10/22/2011   Procedure: CARDIOVERSION;  Surgeon: Elspeth JAYSON Sage, MD;  Location: [facility] OR;  Service: Cardiovascular;  Laterality: N/A;   CARDIOVERSION N/A 09/10/2011   Procedure: CARDIOVERSION;  Surgeon: Elspeth JAYSON Sage, MD;  Location: [facility] CATH LAB;  Service: Cardiovascular;  Laterality: N/A;   CHOLECYSTECTOMY     COLONOSCOPY WITH PROPOFOL  N/A 06/13/2021   Procedure: COLONOSCOPY WITH  PROPOFOL ;  Surgeon: Teressa Toribio SQUIBB, MD;  Location: [facility] ENDOSCOPY;  Service: Endoscopy;  Laterality: N/A;   POLYPECTOMY  06/13/2021   Procedure: POLYPECTOMY;  Surgeon: Teressa Toribio SQUIBB, MD;  Location: [facility] ENDOSCOPY;  Service: Endoscopy;;   TONSILLECTOMY  1982   TOTAL ABDOMINAL HYSTERECTOMY     Social History:  reports that she has never smoked. She has never used smokeless tobacco. She reports that she does not drink alcohol and does not use drugs. Family History:  Family History  Problem Relation Age of Onset   Heart disease Father    Hypertension Father    Breast cancer Sister    Cancer Sister        breast   Colon cancer Neg Hx    Esophageal cancer Neg Hx    Pancreatic cancer Neg Hx    Liver disease Neg Hx      HOME MEDICATIONS: Allergies as of 08/12/2024       Reactions   Doxycycline     Made patient feel very ill    Metformin  Diarrhea        Medication List        Accurate as of August 12, 2024  7:11 AM. If you have any questions, ask your nurse or doctor.          Accu-Chek Softclix Lancets lancets 1 each by Other route 3 (three) times daily. as directed   allopurinol  300 MG tablet Commonly known as: ZYLOPRIM  Take 0.5 tablets (150 mg total) by mouth daily. TAKE 1/2 tablet daily   apixaban  2.5 MG Tabs tablet Commonly known as: ELIQUIS  Take 2.5 mg by mouth 2 (two) times daily.   BD Pen Needle Nano 2nd Gen 32G X 4 MM Misc Generic drug: Insulin  Pen Needle USE TWICE DAILY AS NEEDED   blood glucose meter kit and supplies Dispense based on patient and insurance preference. Use up to four times daily as directed. (FOR ICD-10 E10.9, E11.9).   diltiazem  180 MG 24 hr capsule Commonly known as: CARDIZEM  CD Take 1 capsule (180 mg total) by mouth daily.   FreeStyle Libre 2 Reader Bennet Use to check blood glucose. Use as directed   FreeStyle Libre 2 Sensor Misc USE AS DIRECTED TO CHECK BLOOD  SUGAR DAILY   Gvoke HypoPen  2-Pack 0.5 MG/0.1ML Soaj Generic  drug: Glucagon  Inject 0.5 mg into the skin daily as needed. For Hypoglycemic events   Insulin  Lispro Prot & Lispro (75-25) 100 UNIT/ML Kwikpen Commonly known as: HumaLOG  Mix 75/25 KwikPen Inject 22-24 Units into the skin 2 (two) times daily before a meal.   levothyroxine  112 MCG tablet Commonly known as: SYNTHROID  Take 1 tablet (112 mcg total) by mouth daily.   metoprolol  succinate 100 MG 24 hr tablet Commonly known as: TOPROL -XL Take 100 mg by mouth 2 (two) times daily.   rosuvastatin  10 MG tablet Commonly known as: CRESTOR  Take 1 tablet (10 mg total) by mouth every evening.   torsemide  20 MG tablet Commonly known as: DEMADEX  Take 1 tablet (20 mg  total) by mouth daily. Please keep scheduled appointment for future refills. Thank you.         OBJECTIVE:   Vital Signs: There were no vitals taken for this visit.   Wt Readings from Last 3 Encounters:  04/11/24 235 lb (106.6 kg)  04/07/24 231 lb (104.8 kg)  01/06/24 248 lb (112.5 kg)     Exam: General: Pt appears well and is in NAD, using a walker  Lungs: Clear with good BS bilat   Heart: Regular  Extremities: No pretibial edema.   Neuro: MS is good with appropriate affect, pt is alert and Ox3    DM foot exam: 05/05/2023 per podiatry      DATA REVIEWED:  Lab Results  Component Value Date   HGBA1C 9.2 (A) 04/11/2024   HGBA1C 7.4 (A) 12/11/2023   HGBA1C 9.1 (A) 07/07/2023     Labs through Care Everywhere 08/12/2024  Sodium 133 Potassium 3.8 BUN 43 Creatinine 1.88 GFR 27 A1c 10.9  Old records , labs and images have been reviewed.    ASSESSMENT / PLAN / RECOMMENDATIONS:   1) Type 2 Diabetes Mellitus, Poorly  controlled, With Neuropathic and CKD complications - Most recent A1c of 10.9%. Goal A1c < 7.5 %.    -Patient with worsening glycemic control - Intolerant to Metformin   -Mounjaro  was discontinued during hospitalization for CHF/A-fib 10/2023 -A1c is trending down - They forgot freestyle libre  receiver today, no glucose data - Per spouse her BG's are persistently over 200 mg/DL, I have recommended increasing Humalog  mix to 22 units twice daily for 1 week, if hyperglycemia persist will need to increase to 24 units before breakfast and supper  MEDICATIONS:  -Take Humalog  Mix 22-24 units with Breakfast and 22-24 units with Supper    EDUCATION / INSTRUCTIONS: BG monitoring instructions: Patient is instructed to check her blood sugars 3 times a day, before meals . Call [place] [facility] clinic if: BG persistently < 70 I reviewed the Rule of 15 for the treatment of hypoglycemia in detail with the patient. Literature supplied.    2) Diabetic complications:  Eye: Does not have known diabetic retinopathy.  Neuro/ Feet: Does have known diabetic peripheral neuropathy .  Renal: Patient does not have known baseline CKD. She   is on an ACEI/ARB at present.     F/U in 4 months       Signed electronically by: Stefano Redgie Butts, MD  [facility] Endocrinology  [facility] [address] [place] [place], [place] [address] Phone: [phone] FAX: [phone]   CC: [facility] [address] [place] [address] Phone: [phone]  Fax: [phone]  Return to Endocrinology clinic as below: Future Appointments  Date Time Provider Department Center  08/12/2024  9:30 AM Kemper Hochman, Donell Redgie, MD [id number] None

## 2024-08-15 ENCOUNTER — Telehealth: Payer: Self-pay | Admitting: Emergency Medicine

## 2024-08-15 ENCOUNTER — Telehealth: Payer: Self-pay | Admitting: *Deleted

## 2024-08-15 NOTE — Telephone Encounter (Unsigned)
 Copied from CRM [id number]. Topic: Clinical - Medication Question >> Aug 15, 2024  3:10 PM Ashley R wrote: Reason for CRM: Nurse calling in after hospital discharge. Not taking Iron because they are out. Wants to clarify if she should still be taking iron. And if so will need a refill. Recommending hospital f/u

## 2024-08-15 NOTE — Transitions of Care (Post Inpatient/ED Visit) (Signed)
 08/15/2024  Name: [name] MRN: [id number] DOB: 1941-10-17  Today's TOC FU Call Status: Today's TOC FU Call Status:: Successful TOC FU Call Completed TOC FU Call Complete Date: 08/15/24 Patient's Name and Date of Birth confirmed.  Contacted [facility]/ housecalls to verify current PCP: Spoke with Ronal: verified patient active with [facility] PCP [facility] with last home visit for Primary Care/ hospital follow up made on 08/13/24: Full TOC call and assessments- deferred accordingly: patient was not contacted    Transition Care Management Follow-up Telephone Call    Items Reviewed:    Medications Reviewed Today: Medications Reviewed Today     Reviewed by Rodina Pinales M, RN (Registered Nurse) on 08/15/24 at [phone]  Med List Status: <None>   Medication Order Taking? Sig Documenting Provider Last Dose Status Informant  Accu-Chek Softclix Lancets lancets [id number]  1 each by Other route 3 (three) times daily. as directed [name], NP  Active Self           Med Note (Danyla Wattley M   Thu Nov 05, 2023 11:28 AM) 11/05/23:  Patient declined full medication review during [facility] [place] call; states her husband manages medications- states she has no concerns/ questions around current medications and is taking all as prescribed post-hospital discharge on 11/05/23   allopurinol  (ZYLOPRIM ) 300 MG tablet [id number]  Take 0.5 tablets (150 mg total) by mouth daily. TAKE 1/2 tablet daily Sagardia, Emil Schanz, MD  Active   apixaban  (ELIQUIS ) 2.5 MG TABS tablet [id number]  Take 2.5 mg by mouth 2 (two) times daily. [provider]  Active   blood glucose meter kit and supplies [id number]  Dispense based on patient and insurance preference. Use up to four times daily as directed. (FOR ICD-10 E10.9, E11.9). Purcell Emil Schanz, MD  Active Self  Continuous Blood Gluc Receiver (FREESTYLE LIBRE 2 READER) DEVI [id number]  Use to check blood glucose. Use as directed Purcell Emil Schanz, MD  Active   Continuous Glucose Sensor (FREESTYLE LIBRE 2 SENSOR) [place] [id number]  USE AS DIRECTED TO CHECK BLOOD  SUGAR DAILY [name], [name], MD  Active   diltiazem  (CARDIZEM  CD) 180 MG 24 hr capsule [id number]  Take 1 capsule (180 mg total) by mouth daily. Sagardia, Miguel Jose, MD  Active   Glucagon  (GVOKE HYPOPEN  2-PACK) 0.5 MG/0.1ML EMMANUEL [id number]  Inject 0.5 mg into the skin daily as needed. For Hypoglycemic events  Patient not taking: Reported on 04/11/2024   Sagardia, Miguel Jose, MD  Active            Med Note (Milo Schreier M   Tue Dec 01, 2023 10:21 AM) 12/01/23: Reports during TOC 30-day program outreach:  she does not have this medication at her home/ reports no recent low blood sugars  Insulin  Lispro Prot & Lispro (HUMALOG  MIX 75/25 KWIKPEN) (75-25) 100 UNIT/ML Kary [id number]  Inject 22-24 Units into the skin 2 (two) times daily before a meal. [name], Donell Cardinal, MD  Active   Insulin  Pen Needle (BD PEN NEEDLE NANO 2ND GEN) 32G X 4 MM MISC [id number]  USE TWICE DAILY AS NEEDED Purcell Emil Schanz, MD  Active   levothyroxine  (SYNTHROID ) 112 MCG tablet [id number]  Take 1 tablet (112 mcg total) by mouth daily. Sagardia, Miguel Jose, MD  Active   metoprolol  succinate (TOPROL -XL) 100 MG 24 hr tablet [id number]  Take 100 mg by mouth 2 (two) times daily. [provider]  Active   Discontinued 03/06/22 1303   rosuvastatin  (CRESTOR ) 10 MG tablet  [id number]  Take 1 tablet (10 mg total) by mouth every evening. Sagardia, Miguel Jose, MD  Active   torsemide  (DEMADEX ) 20 MG tablet [id number]  Take 1 tablet (20 mg total) by mouth daily. Please keep scheduled appointment for future refills. Thank you. Purcell Emil Schanz, MD  Active             Home Care and Equipment/Supplies:    Functional Questionnaire:    Follow up appointments reviewed:    Pls call/ message for questions,  Joshiah Traynham Mckinney Maame Dack, RN, BSN, CCRN Alumnus RN Care Manager   Transitions of Care  [facility] - [facility] [phone]: direct office

## 2024-08-16 DIAGNOSIS — E1142 Type 2 diabetes mellitus with diabetic polyneuropathy: Secondary | ICD-10-CM | POA: Diagnosis not present

## 2024-08-16 DIAGNOSIS — E1159 Type 2 diabetes mellitus with other circulatory complications: Secondary | ICD-10-CM | POA: Diagnosis not present

## 2024-08-16 DIAGNOSIS — I700 Atherosclerosis of aorta: Secondary | ICD-10-CM | POA: Diagnosis not present

## 2024-08-16 DIAGNOSIS — D631 Anemia in chronic kidney disease: Secondary | ICD-10-CM | POA: Diagnosis not present

## 2024-08-16 DIAGNOSIS — N1831 Chronic kidney disease, stage 3a: Secondary | ICD-10-CM | POA: Diagnosis not present

## 2024-08-16 DIAGNOSIS — I130 Hypertensive heart and chronic kidney disease with heart failure and stage 1 through stage 4 chronic kidney disease, or unspecified chronic kidney disease: Secondary | ICD-10-CM | POA: Diagnosis not present

## 2024-08-16 DIAGNOSIS — I5032 Chronic diastolic (congestive) heart failure: Secondary | ICD-10-CM | POA: Diagnosis not present

## 2024-08-16 DIAGNOSIS — M1030 Gout due to renal impairment, unspecified site: Secondary | ICD-10-CM | POA: Diagnosis not present

## 2024-08-16 NOTE — Telephone Encounter (Signed)
 Over-the-counter will do

## 2024-08-16 NOTE — Telephone Encounter (Signed)
 Yes

## 2024-08-17 DIAGNOSIS — I700 Atherosclerosis of aorta: Secondary | ICD-10-CM | POA: Diagnosis not present

## 2024-08-17 DIAGNOSIS — N1832 Chronic kidney disease, stage 3b: Secondary | ICD-10-CM | POA: Diagnosis not present

## 2024-08-17 DIAGNOSIS — E1142 Type 2 diabetes mellitus with diabetic polyneuropathy: Secondary | ICD-10-CM | POA: Diagnosis not present

## 2024-08-17 DIAGNOSIS — E039 Hypothyroidism, unspecified: Secondary | ICD-10-CM | POA: Diagnosis not present

## 2024-08-17 DIAGNOSIS — I130 Hypertensive heart and chronic kidney disease with heart failure and stage 1 through stage 4 chronic kidney disease, or unspecified chronic kidney disease: Secondary | ICD-10-CM | POA: Diagnosis not present

## 2024-08-17 DIAGNOSIS — D631 Anemia in chronic kidney disease: Secondary | ICD-10-CM | POA: Diagnosis not present

## 2024-08-17 DIAGNOSIS — N1831 Chronic kidney disease, stage 3a: Secondary | ICD-10-CM | POA: Diagnosis not present

## 2024-08-17 DIAGNOSIS — Z7689 Persons encountering health services in other specified circumstances: Secondary | ICD-10-CM | POA: Diagnosis not present

## 2024-08-17 DIAGNOSIS — I5032 Chronic diastolic (congestive) heart failure: Secondary | ICD-10-CM | POA: Diagnosis not present

## 2024-08-17 DIAGNOSIS — E1159 Type 2 diabetes mellitus with other circulatory complications: Secondary | ICD-10-CM | POA: Diagnosis not present

## 2024-08-17 DIAGNOSIS — M1030 Gout due to renal impairment, unspecified site: Secondary | ICD-10-CM | POA: Diagnosis not present

## 2024-08-19 DIAGNOSIS — I5032 Chronic diastolic (congestive) heart failure: Secondary | ICD-10-CM | POA: Diagnosis not present

## 2024-08-19 DIAGNOSIS — I130 Hypertensive heart and chronic kidney disease with heart failure and stage 1 through stage 4 chronic kidney disease, or unspecified chronic kidney disease: Secondary | ICD-10-CM | POA: Diagnosis not present

## 2024-08-19 DIAGNOSIS — Z1389 Encounter for screening for other disorder: Secondary | ICD-10-CM | POA: Diagnosis not present

## 2024-08-19 DIAGNOSIS — N1832 Chronic kidney disease, stage 3b: Secondary | ICD-10-CM | POA: Diagnosis not present

## 2024-08-19 DIAGNOSIS — I4821 Permanent atrial fibrillation: Secondary | ICD-10-CM | POA: Diagnosis not present

## 2024-08-19 NOTE — Telephone Encounter (Signed)
 Spoke to patient and advised her OTC iron medication will do.

## 2024-08-22 DIAGNOSIS — M1030 Gout due to renal impairment, unspecified site: Secondary | ICD-10-CM | POA: Diagnosis not present

## 2024-08-22 DIAGNOSIS — N1831 Chronic kidney disease, stage 3a: Secondary | ICD-10-CM | POA: Diagnosis not present

## 2024-08-22 DIAGNOSIS — I5032 Chronic diastolic (congestive) heart failure: Secondary | ICD-10-CM | POA: Diagnosis not present

## 2024-08-22 DIAGNOSIS — I130 Hypertensive heart and chronic kidney disease with heart failure and stage 1 through stage 4 chronic kidney disease, or unspecified chronic kidney disease: Secondary | ICD-10-CM | POA: Diagnosis not present

## 2024-08-22 DIAGNOSIS — E1142 Type 2 diabetes mellitus with diabetic polyneuropathy: Secondary | ICD-10-CM | POA: Diagnosis not present

## 2024-08-22 DIAGNOSIS — E1159 Type 2 diabetes mellitus with other circulatory complications: Secondary | ICD-10-CM | POA: Diagnosis not present

## 2024-08-22 DIAGNOSIS — D631 Anemia in chronic kidney disease: Secondary | ICD-10-CM | POA: Diagnosis not present

## 2024-08-23 DIAGNOSIS — I517 Cardiomegaly: Secondary | ICD-10-CM | POA: Diagnosis not present

## 2024-08-23 DIAGNOSIS — R5381 Other malaise: Secondary | ICD-10-CM | POA: Diagnosis not present

## 2024-08-23 DIAGNOSIS — R4189 Other symptoms and signs involving cognitive functions and awareness: Secondary | ICD-10-CM | POA: Diagnosis not present

## 2024-08-23 DIAGNOSIS — I5033 Acute on chronic diastolic (congestive) heart failure: Secondary | ICD-10-CM | POA: Diagnosis not present

## 2024-08-23 DIAGNOSIS — R7989 Other specified abnormal findings of blood chemistry: Secondary | ICD-10-CM | POA: Diagnosis not present

## 2024-08-23 DIAGNOSIS — J90 Pleural effusion, not elsewhere classified: Secondary | ICD-10-CM | POA: Diagnosis not present

## 2024-08-23 DIAGNOSIS — Z515 Encounter for palliative care: Secondary | ICD-10-CM | POA: Diagnosis not present

## 2024-08-23 DIAGNOSIS — F03A4 Unspecified dementia, mild, with anxiety: Secondary | ICD-10-CM | POA: Diagnosis not present

## 2024-08-23 DIAGNOSIS — R0682 Tachypnea, not elsewhere classified: Secondary | ICD-10-CM | POA: Diagnosis not present

## 2024-08-23 DIAGNOSIS — E876 Hypokalemia: Secondary | ICD-10-CM | POA: Diagnosis not present

## 2024-08-23 DIAGNOSIS — F419 Anxiety disorder, unspecified: Secondary | ICD-10-CM | POA: Diagnosis not present

## 2024-08-23 DIAGNOSIS — I509 Heart failure, unspecified: Secondary | ICD-10-CM | POA: Diagnosis not present

## 2024-08-23 DIAGNOSIS — J811 Chronic pulmonary edema: Secondary | ICD-10-CM | POA: Diagnosis not present

## 2024-08-23 DIAGNOSIS — F03A3 Unspecified dementia, mild, with mood disturbance: Secondary | ICD-10-CM | POA: Diagnosis not present

## 2024-08-23 DIAGNOSIS — Z634 Disappearance and death of family member: Secondary | ICD-10-CM | POA: Diagnosis not present

## 2024-08-23 DIAGNOSIS — I4891 Unspecified atrial fibrillation: Secondary | ICD-10-CM | POA: Diagnosis not present

## 2024-08-23 DIAGNOSIS — R0600 Dyspnea, unspecified: Secondary | ICD-10-CM | POA: Diagnosis not present

## 2024-08-23 DIAGNOSIS — F4321 Adjustment disorder with depressed mood: Secondary | ICD-10-CM | POA: Diagnosis not present

## 2024-08-23 DIAGNOSIS — J9621 Acute and chronic respiratory failure with hypoxia: Secondary | ICD-10-CM | POA: Diagnosis not present

## 2024-08-23 DIAGNOSIS — Z6841 Body Mass Index (BMI) 40.0 and over, adult: Secondary | ICD-10-CM | POA: Diagnosis not present

## 2024-08-23 DIAGNOSIS — I5031 Acute diastolic (congestive) heart failure: Secondary | ICD-10-CM | POA: Diagnosis not present

## 2024-08-23 DIAGNOSIS — Z7189 Other specified counseling: Secondary | ICD-10-CM | POA: Diagnosis not present

## 2024-08-23 DIAGNOSIS — I501 Left ventricular failure: Secondary | ICD-10-CM | POA: Diagnosis not present

## 2024-08-23 DIAGNOSIS — N1832 Chronic kidney disease, stage 3b: Secondary | ICD-10-CM | POA: Diagnosis not present

## 2024-08-23 DIAGNOSIS — I4821 Permanent atrial fibrillation: Secondary | ICD-10-CM | POA: Diagnosis not present

## 2024-08-23 DIAGNOSIS — E1122 Type 2 diabetes mellitus with diabetic chronic kidney disease: Secondary | ICD-10-CM | POA: Diagnosis not present

## 2024-08-23 DIAGNOSIS — I130 Hypertensive heart and chronic kidney disease with heart failure and stage 1 through stage 4 chronic kidney disease, or unspecified chronic kidney disease: Secondary | ICD-10-CM | POA: Diagnosis not present

## 2024-08-24 DIAGNOSIS — R0600 Dyspnea, unspecified: Secondary | ICD-10-CM | POA: Diagnosis not present

## 2024-08-24 DIAGNOSIS — Z7189 Other specified counseling: Secondary | ICD-10-CM | POA: Diagnosis not present

## 2024-08-24 DIAGNOSIS — Z515 Encounter for palliative care: Secondary | ICD-10-CM | POA: Diagnosis not present

## 2024-08-24 DIAGNOSIS — Z634 Disappearance and death of family member: Secondary | ICD-10-CM | POA: Diagnosis not present

## 2024-08-24 DIAGNOSIS — N1832 Chronic kidney disease, stage 3b: Secondary | ICD-10-CM | POA: Diagnosis not present

## 2024-08-24 DIAGNOSIS — I10 Essential (primary) hypertension: Secondary | ICD-10-CM | POA: Diagnosis not present

## 2024-08-24 DIAGNOSIS — I5031 Acute diastolic (congestive) heart failure: Secondary | ICD-10-CM | POA: Diagnosis not present

## 2024-08-24 DIAGNOSIS — G4733 Obstructive sleep apnea (adult) (pediatric): Secondary | ICD-10-CM | POA: Diagnosis not present

## 2024-08-24 DIAGNOSIS — J984 Other disorders of lung: Secondary | ICD-10-CM | POA: Diagnosis not present

## 2024-08-24 DIAGNOSIS — R4189 Other symptoms and signs involving cognitive functions and awareness: Secondary | ICD-10-CM | POA: Diagnosis not present

## 2024-08-24 DIAGNOSIS — F419 Anxiety disorder, unspecified: Secondary | ICD-10-CM | POA: Diagnosis not present

## 2024-08-24 DIAGNOSIS — R5381 Other malaise: Secondary | ICD-10-CM | POA: Diagnosis not present

## 2024-08-24 DIAGNOSIS — Z91199 Patient's noncompliance with other medical treatment and regimen due to unspecified reason: Secondary | ICD-10-CM | POA: Diagnosis not present

## 2024-08-24 DIAGNOSIS — F4321 Adjustment disorder with depressed mood: Secondary | ICD-10-CM | POA: Diagnosis not present

## 2024-08-24 DIAGNOSIS — I509 Heart failure, unspecified: Secondary | ICD-10-CM | POA: Diagnosis not present

## 2024-08-24 DIAGNOSIS — E7849 Other hyperlipidemia: Secondary | ICD-10-CM | POA: Diagnosis not present

## 2024-08-25 DIAGNOSIS — Z634 Disappearance and death of family member: Secondary | ICD-10-CM | POA: Diagnosis not present

## 2024-08-25 DIAGNOSIS — F419 Anxiety disorder, unspecified: Secondary | ICD-10-CM | POA: Diagnosis not present

## 2024-08-25 DIAGNOSIS — I5031 Acute diastolic (congestive) heart failure: Secondary | ICD-10-CM | POA: Diagnosis not present

## 2024-08-25 DIAGNOSIS — N1832 Chronic kidney disease, stage 3b: Secondary | ICD-10-CM | POA: Diagnosis not present

## 2024-08-25 DIAGNOSIS — E7849 Other hyperlipidemia: Secondary | ICD-10-CM | POA: Diagnosis not present

## 2024-08-25 DIAGNOSIS — I10 Essential (primary) hypertension: Secondary | ICD-10-CM | POA: Diagnosis not present

## 2024-08-25 DIAGNOSIS — R5381 Other malaise: Secondary | ICD-10-CM | POA: Diagnosis not present

## 2024-08-25 DIAGNOSIS — J984 Other disorders of lung: Secondary | ICD-10-CM | POA: Diagnosis not present

## 2024-08-25 DIAGNOSIS — R4189 Other symptoms and signs involving cognitive functions and awareness: Secondary | ICD-10-CM | POA: Diagnosis not present

## 2024-08-25 DIAGNOSIS — Z91199 Patient's noncompliance with other medical treatment and regimen due to unspecified reason: Secondary | ICD-10-CM | POA: Diagnosis not present

## 2024-08-25 DIAGNOSIS — Z7189 Other specified counseling: Secondary | ICD-10-CM | POA: Diagnosis not present

## 2024-08-25 DIAGNOSIS — F4321 Adjustment disorder with depressed mood: Secondary | ICD-10-CM | POA: Diagnosis not present

## 2024-08-25 DIAGNOSIS — G4733 Obstructive sleep apnea (adult) (pediatric): Secondary | ICD-10-CM | POA: Diagnosis not present

## 2024-08-25 DIAGNOSIS — Z515 Encounter for palliative care: Secondary | ICD-10-CM | POA: Diagnosis not present

## 2024-08-25 DIAGNOSIS — I509 Heart failure, unspecified: Secondary | ICD-10-CM | POA: Diagnosis not present

## 2024-08-25 DIAGNOSIS — R0600 Dyspnea, unspecified: Secondary | ICD-10-CM | POA: Diagnosis not present

## 2024-08-26 DIAGNOSIS — J9601 Acute respiratory failure with hypoxia: Secondary | ICD-10-CM | POA: Diagnosis not present

## 2024-08-26 DIAGNOSIS — I5032 Chronic diastolic (congestive) heart failure: Secondary | ICD-10-CM | POA: Diagnosis not present

## 2024-08-26 DIAGNOSIS — J810 Acute pulmonary edema: Secondary | ICD-10-CM | POA: Diagnosis not present

## 2024-08-26 DIAGNOSIS — I5031 Acute diastolic (congestive) heart failure: Secondary | ICD-10-CM | POA: Diagnosis not present

## 2024-08-26 DIAGNOSIS — Z710 Person encountering health services to consult on behalf of another person: Secondary | ICD-10-CM | POA: Diagnosis not present

## 2024-08-26 DIAGNOSIS — R0602 Shortness of breath: Secondary | ICD-10-CM | POA: Diagnosis not present

## 2024-08-26 DIAGNOSIS — I4891 Unspecified atrial fibrillation: Secondary | ICD-10-CM | POA: Diagnosis not present

## 2024-08-26 DIAGNOSIS — Z515 Encounter for palliative care: Secondary | ICD-10-CM | POA: Diagnosis not present

## 2024-08-26 DIAGNOSIS — I480 Paroxysmal atrial fibrillation: Secondary | ICD-10-CM | POA: Diagnosis not present

## 2024-08-26 DIAGNOSIS — N1832 Chronic kidney disease, stage 3b: Secondary | ICD-10-CM | POA: Diagnosis not present

## 2024-08-26 DIAGNOSIS — J984 Other disorders of lung: Secondary | ICD-10-CM | POA: Diagnosis not present

## 2024-08-27 DIAGNOSIS — Z91199 Patient's noncompliance with other medical treatment and regimen due to unspecified reason: Secondary | ICD-10-CM | POA: Diagnosis not present

## 2024-08-27 DIAGNOSIS — G4733 Obstructive sleep apnea (adult) (pediatric): Secondary | ICD-10-CM | POA: Diagnosis not present

## 2024-08-27 DIAGNOSIS — I10 Essential (primary) hypertension: Secondary | ICD-10-CM | POA: Diagnosis not present

## 2024-08-27 DIAGNOSIS — J984 Other disorders of lung: Secondary | ICD-10-CM | POA: Diagnosis not present

## 2024-08-27 DIAGNOSIS — E7849 Other hyperlipidemia: Secondary | ICD-10-CM | POA: Diagnosis not present

## 2024-08-27 DIAGNOSIS — N1832 Chronic kidney disease, stage 3b: Secondary | ICD-10-CM | POA: Diagnosis not present

## 2024-08-28 DIAGNOSIS — N1832 Chronic kidney disease, stage 3b: Secondary | ICD-10-CM | POA: Diagnosis not present

## 2024-08-28 DIAGNOSIS — I10 Essential (primary) hypertension: Secondary | ICD-10-CM | POA: Diagnosis not present

## 2024-08-28 DIAGNOSIS — E7849 Other hyperlipidemia: Secondary | ICD-10-CM | POA: Diagnosis not present

## 2024-08-28 DIAGNOSIS — J984 Other disorders of lung: Secondary | ICD-10-CM | POA: Diagnosis not present

## 2024-08-28 DIAGNOSIS — Z91199 Patient's noncompliance with other medical treatment and regimen due to unspecified reason: Secondary | ICD-10-CM | POA: Diagnosis not present

## 2024-08-28 DIAGNOSIS — G4733 Obstructive sleep apnea (adult) (pediatric): Secondary | ICD-10-CM | POA: Diagnosis not present

## 2024-08-29 DIAGNOSIS — R0600 Dyspnea, unspecified: Secondary | ICD-10-CM | POA: Diagnosis not present

## 2024-08-29 DIAGNOSIS — R5381 Other malaise: Secondary | ICD-10-CM | POA: Diagnosis not present

## 2024-08-29 DIAGNOSIS — Z634 Disappearance and death of family member: Secondary | ICD-10-CM | POA: Diagnosis not present

## 2024-08-29 DIAGNOSIS — I5031 Acute diastolic (congestive) heart failure: Secondary | ICD-10-CM | POA: Diagnosis not present

## 2024-08-29 DIAGNOSIS — F4321 Adjustment disorder with depressed mood: Secondary | ICD-10-CM | POA: Diagnosis not present

## 2024-08-29 DIAGNOSIS — R4189 Other symptoms and signs involving cognitive functions and awareness: Secondary | ICD-10-CM | POA: Diagnosis not present

## 2024-08-29 DIAGNOSIS — Z7189 Other specified counseling: Secondary | ICD-10-CM | POA: Diagnosis not present

## 2024-08-29 DIAGNOSIS — I509 Heart failure, unspecified: Secondary | ICD-10-CM | POA: Diagnosis not present

## 2024-08-29 DIAGNOSIS — F419 Anxiety disorder, unspecified: Secondary | ICD-10-CM | POA: Diagnosis not present

## 2024-08-30 DIAGNOSIS — I5031 Acute diastolic (congestive) heart failure: Secondary | ICD-10-CM | POA: Diagnosis not present

## 2024-08-31 DIAGNOSIS — I5031 Acute diastolic (congestive) heart failure: Secondary | ICD-10-CM | POA: Diagnosis not present

## 2024-09-01 DIAGNOSIS — I5031 Acute diastolic (congestive) heart failure: Secondary | ICD-10-CM | POA: Diagnosis not present

## 2024-09-05 ENCOUNTER — Other Ambulatory Visit: Payer: Self-pay | Admitting: Emergency Medicine

## 2024-09-13 DIAGNOSIS — I4821 Permanent atrial fibrillation: Secondary | ICD-10-CM | POA: Diagnosis not present

## 2024-09-13 DIAGNOSIS — E1169 Type 2 diabetes mellitus with other specified complication: Secondary | ICD-10-CM | POA: Diagnosis not present

## 2024-10-25 ENCOUNTER — Other Ambulatory Visit: Payer: Self-pay | Admitting: Emergency Medicine

## 2024-11-02 ENCOUNTER — Telehealth: Payer: Self-pay

## 2024-11-02 NOTE — Telephone Encounter (Signed)
 Copied from CRM #[id number]. Topic: Clinical - Order For Equipment >> Nov 02, 2024 10:29 AM Tiffini S wrote: Reason for CRM: Jon with [employer] [phone] fax number [phone]    States that clinical notes are needed for CPAP supplies  States that a fax was sent back in December to Dr. Purcell Emil PARAS fax number: [phone]

## 2024-11-03 NOTE — Telephone Encounter (Signed)
 Last office note has been fax back over to [employer]
